# Patient Record
Sex: Female | Born: 1945 | Race: Black or African American | Hispanic: No | Marital: Married | Smoking: Never smoker
Health system: Southern US, Community
[De-identification: ages and names within clinical notes are randomized; demographics above are authoritative.]

## PROBLEM LIST (undated history)

## (undated) DIAGNOSIS — Z923 Personal history of irradiation: Secondary | ICD-10-CM

## (undated) DIAGNOSIS — F329 Major depressive disorder, single episode, unspecified: Secondary | ICD-10-CM

## (undated) DIAGNOSIS — Z8042 Family history of malignant neoplasm of prostate: Secondary | ICD-10-CM

## (undated) DIAGNOSIS — I471 Supraventricular tachycardia, unspecified: Secondary | ICD-10-CM

## (undated) DIAGNOSIS — F419 Anxiety disorder, unspecified: Secondary | ICD-10-CM

## (undated) DIAGNOSIS — Z9221 Personal history of antineoplastic chemotherapy: Secondary | ICD-10-CM

## (undated) DIAGNOSIS — Z90710 Acquired absence of both cervix and uterus: Secondary | ICD-10-CM

## (undated) DIAGNOSIS — A159 Respiratory tuberculosis unspecified: Secondary | ICD-10-CM

## (undated) DIAGNOSIS — Z800 Family history of malignant neoplasm of digestive organs: Secondary | ICD-10-CM

## (undated) DIAGNOSIS — I5020 Unspecified systolic (congestive) heart failure: Secondary | ICD-10-CM

## (undated) DIAGNOSIS — F32A Depression, unspecified: Secondary | ICD-10-CM

## (undated) DIAGNOSIS — I499 Cardiac arrhythmia, unspecified: Secondary | ICD-10-CM

## (undated) DIAGNOSIS — E785 Hyperlipidemia, unspecified: Secondary | ICD-10-CM

## (undated) DIAGNOSIS — K449 Diaphragmatic hernia without obstruction or gangrene: Secondary | ICD-10-CM

## (undated) DIAGNOSIS — C50919 Malignant neoplasm of unspecified site of unspecified female breast: Secondary | ICD-10-CM

## (undated) DIAGNOSIS — I428 Other cardiomyopathies: Secondary | ICD-10-CM

## (undated) DIAGNOSIS — K219 Gastro-esophageal reflux disease without esophagitis: Secondary | ICD-10-CM

## (undated) DIAGNOSIS — T783XXA Angioneurotic edema, initial encounter: Secondary | ICD-10-CM

## (undated) DIAGNOSIS — R928 Other abnormal and inconclusive findings on diagnostic imaging of breast: Secondary | ICD-10-CM

## (undated) DIAGNOSIS — I10 Essential (primary) hypertension: Secondary | ICD-10-CM

## (undated) DIAGNOSIS — D721 Eosinophilia: Secondary | ICD-10-CM

## (undated) HISTORY — DX: Personal history of antineoplastic chemotherapy: Z92.21

## (undated) HISTORY — DX: Personal history of irradiation: Z92.3

## (undated) HISTORY — DX: Family history of malignant neoplasm of digestive organs: Z80.0

## (undated) HISTORY — DX: Family history of malignant neoplasm of prostate: Z80.42

## (undated) HISTORY — DX: Angioneurotic edema, initial encounter: T78.3XXA

## (undated) HISTORY — DX: Depression, unspecified: F32.A

## (undated) HISTORY — DX: Gastro-esophageal reflux disease without esophagitis: K21.9

## (undated) HISTORY — DX: Other cardiomyopathies: I42.8

## (undated) HISTORY — DX: Major depressive disorder, single episode, unspecified: F32.9

## (undated) HISTORY — DX: Eosinophilia: D72.1

## (undated) HISTORY — DX: Acquired absence of both cervix and uterus: Z90.710

## (undated) HISTORY — PX: CARDIAC CATHETERIZATION: SHX172

## (undated) HISTORY — DX: Malignant neoplasm of unspecified site of unspecified female breast: C50.919

---

## 1992-09-22 HISTORY — PX: ABDOMINAL HYSTERECTOMY: SHX81

## 1997-09-22 HISTORY — PX: CHOLECYSTECTOMY: SHX55

## 2001-01-13 ENCOUNTER — Ambulatory Visit (HOSPITAL_COMMUNITY): Admission: RE | Admit: 2001-01-13 | Discharge: 2001-01-13 | Payer: Self-pay | Admitting: General Surgery

## 2002-02-25 ENCOUNTER — Encounter: Payer: Self-pay | Admitting: Neurosurgery

## 2002-02-25 ENCOUNTER — Ambulatory Visit (HOSPITAL_COMMUNITY): Admission: RE | Admit: 2002-02-25 | Discharge: 2002-02-25 | Payer: Self-pay | Admitting: Neurosurgery

## 2002-04-05 ENCOUNTER — Ambulatory Visit (HOSPITAL_COMMUNITY): Admission: RE | Admit: 2002-04-05 | Discharge: 2002-04-05 | Payer: Self-pay | Admitting: Family Medicine

## 2002-04-05 ENCOUNTER — Encounter: Payer: Self-pay | Admitting: Family Medicine

## 2002-08-25 ENCOUNTER — Encounter: Payer: Self-pay | Admitting: Neurosurgery

## 2002-08-25 ENCOUNTER — Ambulatory Visit (HOSPITAL_COMMUNITY): Admission: RE | Admit: 2002-08-25 | Discharge: 2002-08-25 | Payer: Self-pay | Admitting: Neurosurgery

## 2003-03-01 ENCOUNTER — Ambulatory Visit (HOSPITAL_COMMUNITY): Admission: RE | Admit: 2003-03-01 | Discharge: 2003-03-01 | Payer: Self-pay | Admitting: Neurosurgery

## 2003-03-01 ENCOUNTER — Encounter: Payer: Self-pay | Admitting: Neurosurgery

## 2003-05-15 ENCOUNTER — Ambulatory Visit (HOSPITAL_COMMUNITY): Admission: RE | Admit: 2003-05-15 | Discharge: 2003-05-15 | Payer: Self-pay | Admitting: Family Medicine

## 2003-05-15 ENCOUNTER — Encounter: Payer: Self-pay | Admitting: Family Medicine

## 2003-05-18 ENCOUNTER — Encounter (HOSPITAL_COMMUNITY): Admission: RE | Admit: 2003-05-18 | Discharge: 2003-06-14 | Payer: Self-pay | Admitting: Family Medicine

## 2003-06-01 ENCOUNTER — Encounter: Payer: Self-pay | Admitting: Family Medicine

## 2003-06-19 ENCOUNTER — Encounter (HOSPITAL_COMMUNITY): Admission: RE | Admit: 2003-06-19 | Discharge: 2003-07-19 | Payer: Self-pay | Admitting: Family Medicine

## 2003-07-05 ENCOUNTER — Ambulatory Visit (HOSPITAL_COMMUNITY): Admission: RE | Admit: 2003-07-05 | Discharge: 2003-07-05 | Payer: Self-pay | Admitting: Family Medicine

## 2003-07-05 ENCOUNTER — Encounter: Payer: Self-pay | Admitting: Family Medicine

## 2004-03-06 ENCOUNTER — Ambulatory Visit (HOSPITAL_COMMUNITY): Admission: RE | Admit: 2004-03-06 | Discharge: 2004-03-06 | Payer: Self-pay | Admitting: Neurosurgery

## 2004-10-04 ENCOUNTER — Ambulatory Visit: Payer: Self-pay | Admitting: Family Medicine

## 2004-10-07 ENCOUNTER — Ambulatory Visit (HOSPITAL_COMMUNITY): Admission: RE | Admit: 2004-10-07 | Discharge: 2004-10-07 | Payer: Self-pay | Admitting: Family Medicine

## 2005-04-02 ENCOUNTER — Ambulatory Visit: Payer: Self-pay | Admitting: Family Medicine

## 2005-06-04 ENCOUNTER — Ambulatory Visit: Payer: Self-pay | Admitting: Family Medicine

## 2005-10-09 ENCOUNTER — Ambulatory Visit: Payer: Self-pay | Admitting: Family Medicine

## 2006-02-05 ENCOUNTER — Ambulatory Visit: Payer: Self-pay | Admitting: Family Medicine

## 2006-03-05 ENCOUNTER — Ambulatory Visit (HOSPITAL_COMMUNITY): Admission: RE | Admit: 2006-03-05 | Discharge: 2006-03-05 | Payer: Self-pay | Admitting: Neurosurgery

## 2006-09-22 DIAGNOSIS — C50919 Malignant neoplasm of unspecified site of unspecified female breast: Secondary | ICD-10-CM

## 2006-09-22 HISTORY — PX: BREAST SURGERY: SHX581

## 2006-09-22 HISTORY — DX: Malignant neoplasm of unspecified site of unspecified female breast: C50.919

## 2006-09-22 HISTORY — PX: COLONOSCOPY: SHX174

## 2006-11-11 ENCOUNTER — Encounter: Payer: Self-pay | Admitting: Family Medicine

## 2006-11-11 ENCOUNTER — Encounter (INDEPENDENT_AMBULATORY_CARE_PROVIDER_SITE_OTHER): Payer: Self-pay | Admitting: *Deleted

## 2006-11-11 ENCOUNTER — Other Ambulatory Visit: Admission: RE | Admit: 2006-11-11 | Discharge: 2006-11-11 | Payer: Self-pay | Admitting: Family Medicine

## 2006-11-11 ENCOUNTER — Ambulatory Visit: Payer: Self-pay | Admitting: Family Medicine

## 2006-11-25 ENCOUNTER — Ambulatory Visit (HOSPITAL_COMMUNITY): Admission: RE | Admit: 2006-11-25 | Discharge: 2006-11-25 | Payer: Self-pay | Admitting: Family Medicine

## 2006-12-01 ENCOUNTER — Encounter: Payer: Self-pay | Admitting: Family Medicine

## 2006-12-01 LAB — CONVERTED CEMR LAB
BUN: 16 mg/dL (ref 6–23)
Basophils Absolute: 0 10*3/uL (ref 0.0–0.1)
Basophils Relative: 0 % (ref 0–1)
CO2: 23 meq/L (ref 19–32)
Cholesterol: 231 mg/dL — ABNORMAL HIGH (ref 0–200)
Creatinine, Ser: 0.88 mg/dL (ref 0.40–1.20)
Eosinophils Relative: 2 % (ref 0–5)
HDL: 65 mg/dL (ref >39)
LDL Cholesterol: 156 mg/dL — ABNORMAL HIGH (ref 0–99)
Monocytes Absolute: 0.6 10*3/uL (ref 0.2–0.7)
Neutro Abs: 7.2 10*3/uL (ref 1.7–7.7)
Potassium: 4.6 meq/L (ref 3.5–5.3)
RBC: 4.61 M/uL (ref 3.87–5.11)
Sodium: 142 meq/L (ref 135–145)
TSH: 1.416 microintl units/mL (ref 0.350–5.50)
Triglycerides: 51 mg/dL (ref <150)
VLDL: 10 mg/dL (ref 0–40)

## 2006-12-02 ENCOUNTER — Encounter: Payer: Self-pay | Admitting: Family Medicine

## 2006-12-02 LAB — CONVERTED CEMR LAB
ALT: 11 units/L (ref 0–35)
AST: 13 units/L (ref 0–37)
Bilirubin, Direct: 0.1 mg/dL (ref 0.0–0.3)
Total Protein: 7.4 g/dL (ref 6.0–8.3)

## 2006-12-16 ENCOUNTER — Ambulatory Visit (HOSPITAL_COMMUNITY): Admission: RE | Admit: 2006-12-16 | Discharge: 2006-12-16 | Payer: Self-pay | Admitting: Family Medicine

## 2006-12-16 ENCOUNTER — Encounter (INDEPENDENT_AMBULATORY_CARE_PROVIDER_SITE_OTHER): Payer: Self-pay | Admitting: Radiology

## 2006-12-16 HISTORY — PX: BREAST BIOPSY: SHX20

## 2006-12-29 ENCOUNTER — Ambulatory Visit (HOSPITAL_COMMUNITY): Admission: RE | Admit: 2006-12-29 | Discharge: 2006-12-29 | Payer: Self-pay | Admitting: Family Medicine

## 2007-01-01 ENCOUNTER — Encounter (INDEPENDENT_AMBULATORY_CARE_PROVIDER_SITE_OTHER): Payer: Self-pay | Admitting: Specialist

## 2007-01-01 ENCOUNTER — Ambulatory Visit (HOSPITAL_COMMUNITY): Admission: RE | Admit: 2007-01-01 | Discharge: 2007-01-01 | Payer: Self-pay | Admitting: Gastroenterology

## 2007-01-01 ENCOUNTER — Ambulatory Visit: Payer: Self-pay | Admitting: Gastroenterology

## 2007-02-02 ENCOUNTER — Encounter: Admission: RE | Admit: 2007-02-02 | Discharge: 2007-02-02 | Payer: Self-pay | Admitting: General Surgery

## 2007-02-03 ENCOUNTER — Ambulatory Visit (HOSPITAL_BASED_OUTPATIENT_CLINIC_OR_DEPARTMENT_OTHER): Admission: RE | Admit: 2007-02-03 | Discharge: 2007-02-03 | Payer: Self-pay | Admitting: General Surgery

## 2007-02-03 ENCOUNTER — Encounter: Payer: Self-pay | Admitting: General Surgery

## 2007-02-03 ENCOUNTER — Encounter (INDEPENDENT_AMBULATORY_CARE_PROVIDER_SITE_OTHER): Payer: Self-pay | Admitting: Specialist

## 2007-02-10 ENCOUNTER — Ambulatory Visit: Payer: Self-pay | Admitting: Oncology

## 2007-02-17 LAB — LACTATE DEHYDROGENASE: LDH: 133 U/L (ref 94–250)

## 2007-02-17 LAB — CANCER ANTIGEN 27.29: CA 27.29: 14 U/mL (ref 0–39)

## 2007-02-17 LAB — COMPREHENSIVE METABOLIC PANEL
Albumin: 3.9 g/dL (ref 3.5–5.2)
CO2: 23 mEq/L (ref 19–32)
Calcium: 9.3 mg/dL (ref 8.4–10.5)
Chloride: 106 mEq/L (ref 96–112)
Glucose, Bld: 94 mg/dL (ref 70–99)
Sodium: 140 mEq/L (ref 135–145)
Total Bilirubin: 0.4 mg/dL (ref 0.3–1.2)
Total Protein: 7.2 g/dL (ref 6.0–8.3)

## 2007-02-17 LAB — CBC WITH DIFFERENTIAL/PLATELET
Eosinophils Absolute: 0.2 10*3/uL (ref 0.0–0.5)
HCT: 33.5 % — ABNORMAL LOW (ref 34.8–46.6)
LYMPH%: 22.2 % (ref 14.0–48.0)
MONO#: 0.5 10*3/uL (ref 0.1–0.9)
NEUT#: 6.3 10*3/uL (ref 1.5–6.5)
Platelets: 272 10*3/uL (ref 145–400)
RBC: 4.35 10*6/uL (ref 3.70–5.32)
WBC: 9 10*3/uL (ref 3.9–10.0)

## 2007-02-21 HISTORY — PX: BREAST LUMPECTOMY: SHX2

## 2007-03-04 ENCOUNTER — Ambulatory Visit: Payer: Self-pay

## 2007-03-04 ENCOUNTER — Ambulatory Visit (HOSPITAL_COMMUNITY): Admission: RE | Admit: 2007-03-04 | Discharge: 2007-03-04 | Payer: Self-pay | Admitting: Oncology

## 2007-03-04 ENCOUNTER — Encounter: Payer: Self-pay | Admitting: Oncology

## 2007-03-08 ENCOUNTER — Encounter (INDEPENDENT_AMBULATORY_CARE_PROVIDER_SITE_OTHER): Payer: Self-pay | Admitting: General Surgery

## 2007-03-08 ENCOUNTER — Ambulatory Visit (HOSPITAL_BASED_OUTPATIENT_CLINIC_OR_DEPARTMENT_OTHER): Admission: RE | Admit: 2007-03-08 | Discharge: 2007-03-09 | Payer: Self-pay | Admitting: General Surgery

## 2007-03-11 LAB — CBC & DIFF AND RETIC
Basophils Absolute: 0 10*3/uL (ref 0.0–0.1)
EOS%: 2.1 % (ref 0.0–7.0)
Eosinophils Absolute: 0.3 10*3/uL (ref 0.0–0.5)
HCT: 35.2 % (ref 34.8–46.6)
HGB: 11.6 g/dL (ref 11.6–15.9)
IRF: 0.29 (ref 0.130–0.330)
MCH: 25.8 pg — ABNORMAL LOW (ref 26.0–34.0)
NEUT#: 8.9 10*3/uL — ABNORMAL HIGH (ref 1.5–6.5)
NEUT%: 65.6 % (ref 39.6–76.8)
RDW: 15.6 % — ABNORMAL HIGH (ref 11.3–14.5)
RETIC #: 41.9 10*3/uL (ref 19.7–115.1)
lymph#: 3.3 10*3/uL (ref 0.9–3.3)

## 2007-03-11 LAB — CHCC SMEAR

## 2007-03-11 LAB — MORPHOLOGY

## 2007-03-12 LAB — HEMOGLOBINOPATHY EVALUATION
Hemoglobin Other: 0 % (ref 0.0–0.0)
Hgb A2 Quant: 2.2 % (ref 2.2–3.2)

## 2007-03-12 LAB — FERRITIN: Ferritin: 37 ng/mL (ref 10–291)

## 2007-03-12 LAB — IRON AND TIBC
Iron: 63 ug/dL (ref 42–145)
TIBC: 296 ug/dL (ref 250–470)
UIBC: 233 ug/dL

## 2007-03-22 ENCOUNTER — Ambulatory Visit (HOSPITAL_COMMUNITY): Admission: RE | Admit: 2007-03-22 | Discharge: 2007-03-22 | Payer: Self-pay | Admitting: Oncology

## 2007-04-01 ENCOUNTER — Ambulatory Visit: Payer: Self-pay | Admitting: Oncology

## 2007-04-15 LAB — CBC WITH DIFFERENTIAL/PLATELET
BASO%: 0.2 % (ref 0.0–2.0)
Basophils Absolute: 0 10*3/uL (ref 0.0–0.1)
Eosinophils Absolute: 0.1 10*3/uL (ref 0.0–0.5)
HCT: 29.9 % — ABNORMAL LOW (ref 34.8–46.6)
HGB: 10.1 g/dL — ABNORMAL LOW (ref 11.6–15.9)
LYMPH%: 60.3 % — ABNORMAL HIGH (ref 14.0–48.0)
MCHC: 33.8 g/dL (ref 32.0–36.0)
MONO#: 0 10*3/uL — ABNORMAL LOW (ref 0.1–0.9)
NEUT#: 0.7 10*3/uL — ABNORMAL LOW (ref 1.5–6.5)
NEUT%: 33.9 % — ABNORMAL LOW (ref 39.6–76.8)
Platelets: 145 10*3/uL (ref 145–400)
WBC: 2 10*3/uL — ABNORMAL LOW (ref 3.9–10.0)
lymph#: 1.2 10*3/uL (ref 0.9–3.3)

## 2007-04-29 LAB — CBC WITH DIFFERENTIAL/PLATELET
BASO%: 0.7 % (ref 0.0–2.0)
EOS%: 0.1 % (ref 0.0–7.0)
HGB: 10.5 g/dL — ABNORMAL LOW (ref 11.6–15.9)
MCH: 25.5 pg — ABNORMAL LOW (ref 26.0–34.0)
MCHC: 32.9 g/dL (ref 32.0–36.0)
RBC: 4.11 10*6/uL (ref 3.70–5.32)
RDW: 13.1 % (ref 11.3–14.5)
lymph#: 3.1 10*3/uL (ref 0.9–3.3)

## 2007-04-29 LAB — COMPREHENSIVE METABOLIC PANEL
ALT: 11 U/L (ref 0–35)
AST: 13 U/L (ref 0–37)
Albumin: 3.8 g/dL (ref 3.5–5.2)
Alkaline Phosphatase: 90 U/L (ref 39–117)
Calcium: 8.9 mg/dL (ref 8.4–10.5)
Chloride: 104 mEq/L (ref 96–112)
Potassium: 3.6 mEq/L (ref 3.5–5.3)
Sodium: 139 mEq/L (ref 135–145)

## 2007-05-06 LAB — CBC WITH DIFFERENTIAL/PLATELET
BASO%: 3.5 % — ABNORMAL HIGH (ref 0.0–2.0)
EOS%: 0.9 % (ref 0.0–7.0)
MCH: 25.4 pg — ABNORMAL LOW (ref 26.0–34.0)
MCHC: 32.6 g/dL (ref 32.0–36.0)
MCV: 77.9 fL — ABNORMAL LOW (ref 81.0–101.0)
MONO%: 1 % (ref 0.0–13.0)
NEUT%: 37.3 % — ABNORMAL LOW (ref 39.6–76.8)
RDW: 13.4 % (ref 11.3–14.5)
lymph#: 0.8 10*3/uL — ABNORMAL LOW (ref 0.9–3.3)

## 2007-05-18 ENCOUNTER — Ambulatory Visit: Payer: Self-pay | Admitting: Oncology

## 2007-05-20 LAB — COMPREHENSIVE METABOLIC PANEL
ALT: 13 U/L (ref 0–35)
AST: 15 U/L (ref 0–37)
Albumin: 3.8 g/dL (ref 3.5–5.2)
Alkaline Phosphatase: 84 U/L (ref 39–117)
BUN: 5 mg/dL — ABNORMAL LOW (ref 6–23)
Calcium: 8.9 mg/dL (ref 8.4–10.5)
Chloride: 100 mEq/L (ref 96–112)
Potassium: 3.7 mEq/L (ref 3.5–5.3)
Sodium: 136 mEq/L (ref 135–145)
Total Protein: 6.4 g/dL (ref 6.0–8.3)

## 2007-05-20 LAB — CBC WITH DIFFERENTIAL/PLATELET
BASO%: 0.5 % (ref 0.0–2.0)
Basophils Absolute: 0 10*3/uL (ref 0.0–0.1)
EOS%: 0 % (ref 0.0–7.0)
HGB: 9.1 g/dL — ABNORMAL LOW (ref 11.6–15.9)
MCH: 26.2 pg (ref 26.0–34.0)
MCV: 77.4 fL — ABNORMAL LOW (ref 81.0–101.0)
MONO%: 9.2 % (ref 0.0–13.0)
RBC: 3.47 10*6/uL — ABNORMAL LOW (ref 3.70–5.32)
RDW: 15.6 % — ABNORMAL HIGH (ref 11.3–14.5)
lymph#: 1.2 10*3/uL (ref 0.9–3.3)

## 2007-05-27 LAB — CBC WITH DIFFERENTIAL/PLATELET
Basophils Absolute: 0 10*3/uL (ref 0.0–0.1)
EOS%: 0.6 % (ref 0.0–7.0)
Eosinophils Absolute: 0 10*3/uL (ref 0.0–0.5)
HCT: 22.4 % — ABNORMAL LOW (ref 34.8–46.6)
HGB: 7.7 g/dL — ABNORMAL LOW (ref 11.6–15.9)
MCH: 26.6 pg (ref 26.0–34.0)
MCV: 77.4 fL — ABNORMAL LOW (ref 81.0–101.0)
MONO%: 1.9 % (ref 0.0–13.0)
NEUT#: 0.3 10*3/uL — CL (ref 1.5–6.5)
NEUT%: 38.9 % — ABNORMAL LOW (ref 39.6–76.8)
Platelets: 99 10*3/uL — ABNORMAL LOW (ref 145–400)

## 2007-05-31 ENCOUNTER — Encounter (HOSPITAL_COMMUNITY): Admission: RE | Admit: 2007-05-31 | Discharge: 2007-06-21 | Payer: Self-pay | Admitting: Oncology

## 2007-05-31 LAB — CBC WITH DIFFERENTIAL/PLATELET
Basophils Absolute: 0 10*3/uL (ref 0.0–0.1)
EOS%: 0.2 % (ref 0.0–7.0)
LYMPH%: 24.4 % (ref 14.0–48.0)
MCH: 26.8 pg (ref 26.0–34.0)
MCV: 77 fL — ABNORMAL LOW (ref 81.0–101.0)
MONO%: 8.4 % (ref 0.0–13.0)
Platelets: 51 10*3/uL — ABNORMAL LOW (ref 145–400)
RBC: 2.91 10*6/uL — ABNORMAL LOW (ref 3.70–5.32)
RDW: 17.7 % — ABNORMAL HIGH (ref 11.3–14.5)

## 2007-05-31 LAB — HOLD TUBE, BLOOD BANK

## 2007-06-02 LAB — CBC WITH DIFFERENTIAL/PLATELET
BASO%: 0.2 % (ref 0.0–2.0)
EOS%: 0.2 % (ref 0.0–7.0)
HCT: 20.6 % — ABNORMAL LOW (ref 34.8–46.6)
LYMPH%: 13 % — ABNORMAL LOW (ref 14.0–48.0)
MCH: 27.1 pg (ref 26.0–34.0)
MCHC: 34.9 g/dL (ref 32.0–36.0)
MONO#: 0.3 10*3/uL (ref 0.1–0.9)
NEUT%: 80.8 % — ABNORMAL HIGH (ref 39.6–76.8)
Platelets: 63 10*3/uL — ABNORMAL LOW (ref 145–400)
RBC: 2.66 10*6/uL — ABNORMAL LOW (ref 3.70–5.32)
WBC: 5.7 10*3/uL (ref 3.9–10.0)

## 2007-06-10 LAB — COMPREHENSIVE METABOLIC PANEL
ALT: 8 U/L (ref 0–35)
AST: 14 U/L (ref 0–37)
CO2: 22 mEq/L (ref 19–32)
Calcium: 8.8 mg/dL (ref 8.4–10.5)
Chloride: 106 mEq/L (ref 96–112)
Creatinine, Ser: 0.66 mg/dL (ref 0.40–1.20)
Potassium: 3.8 mEq/L (ref 3.5–5.3)
Sodium: 142 mEq/L (ref 135–145)
Total Protein: 6.3 g/dL (ref 6.0–8.3)

## 2007-06-10 LAB — CBC WITH DIFFERENTIAL/PLATELET
BASO%: 0.9 % (ref 0.0–2.0)
EOS%: 0.1 % (ref 0.0–7.0)
HCT: 32.4 % — ABNORMAL LOW (ref 34.8–46.6)
MCH: 27.4 pg (ref 26.0–34.0)
MCHC: 33.6 g/dL (ref 32.0–36.0)
MONO#: 0.9 10*3/uL (ref 0.1–0.9)
NEUT%: 76.7 % (ref 39.6–76.8)
RBC: 3.97 10*6/uL (ref 3.70–5.32)
RDW: 16.9 % — ABNORMAL HIGH (ref 11.3–14.5)
WBC: 8 10*3/uL (ref 3.9–10.0)
lymph#: 0.8 10*3/uL — ABNORMAL LOW (ref 0.9–3.3)

## 2007-06-17 LAB — CBC WITH DIFFERENTIAL/PLATELET
Basophils Absolute: 0 10*3/uL (ref 0.0–0.1)
Eosinophils Absolute: 0 10*3/uL (ref 0.0–0.5)
HCT: 29.3 % — ABNORMAL LOW (ref 34.8–46.6)
HGB: 10 g/dL — ABNORMAL LOW (ref 11.6–15.9)
LYMPH%: 38.6 % (ref 14.0–48.0)
MCV: 81.4 fL (ref 81.0–101.0)
MONO%: 0.5 % (ref 0.0–13.0)
NEUT#: 0.6 10*3/uL — ABNORMAL LOW (ref 1.5–6.5)
NEUT%: 59.7 % (ref 39.6–76.8)
Platelets: 83 10*3/uL — ABNORMAL LOW (ref 145–400)

## 2007-06-28 ENCOUNTER — Ambulatory Visit: Payer: Self-pay | Admitting: Oncology

## 2007-07-01 LAB — CBC WITH DIFFERENTIAL/PLATELET
BASO%: 0.4 % (ref 0.0–2.0)
EOS%: 0 % (ref 0.0–7.0)
LYMPH%: 4.2 % — ABNORMAL LOW (ref 14.0–48.0)
MCH: 27.6 pg (ref 26.0–34.0)
MCHC: 33.1 g/dL (ref 32.0–36.0)
MONO#: 2.1 10*3/uL — ABNORMAL HIGH (ref 0.1–0.9)
Platelets: 172 10*3/uL (ref 145–400)
RBC: 3.3 10*6/uL — ABNORMAL LOW (ref 3.70–5.32)
WBC: 16.4 10*3/uL — ABNORMAL HIGH (ref 3.9–10.0)
lymph#: 0.7 10*3/uL — ABNORMAL LOW (ref 0.9–3.3)

## 2007-07-01 LAB — COMPREHENSIVE METABOLIC PANEL
ALT: 12 U/L (ref 0–35)
AST: 14 U/L (ref 0–37)
CO2: 26 mEq/L (ref 19–32)
Creatinine, Ser: 0.65 mg/dL (ref 0.40–1.20)
Sodium: 137 mEq/L (ref 135–145)
Total Bilirubin: 0.4 mg/dL (ref 0.3–1.2)
Total Protein: 6.7 g/dL (ref 6.0–8.3)

## 2007-07-08 LAB — CBC WITH DIFFERENTIAL/PLATELET
BASO%: 1.8 % (ref 0.0–2.0)
LYMPH%: 8.7 % — ABNORMAL LOW (ref 14.0–48.0)
MCHC: 33 g/dL (ref 32.0–36.0)
MCV: 84.3 fL (ref 81.0–101.0)
MONO%: 10.2 % (ref 0.0–13.0)
NEUT%: 79.3 % — ABNORMAL HIGH (ref 39.6–76.8)
Platelets: 152 10*3/uL (ref 145–400)
RBC: 3 10*6/uL — ABNORMAL LOW (ref 3.70–5.32)

## 2007-07-22 LAB — COMPREHENSIVE METABOLIC PANEL
ALT: 13 U/L (ref 0–35)
AST: 14 U/L (ref 0–37)
Albumin: 3.6 g/dL (ref 3.5–5.2)
CO2: 24 mEq/L (ref 19–32)
Calcium: 8.6 mg/dL (ref 8.4–10.5)
Chloride: 103 mEq/L (ref 96–112)
Creatinine, Ser: 0.57 mg/dL (ref 0.40–1.20)
Potassium: 3.4 mEq/L — ABNORMAL LOW (ref 3.5–5.3)
Sodium: 137 mEq/L (ref 135–145)
Total Protein: 6.3 g/dL (ref 6.0–8.3)

## 2007-07-22 LAB — CBC WITH DIFFERENTIAL/PLATELET
BASO%: 0.2 % (ref 0.0–2.0)
EOS%: 0 % (ref 0.0–7.0)
HCT: 27.3 % — ABNORMAL LOW (ref 34.8–46.6)
LYMPH%: 9 % — ABNORMAL LOW (ref 14.0–48.0)
MCH: 29 pg (ref 26.0–34.0)
MCHC: 32.4 g/dL (ref 32.0–36.0)
MCV: 89.5 fL (ref 81.0–101.0)
MONO%: 17.5 % — ABNORMAL HIGH (ref 0.0–13.0)
NEUT%: 73.2 % (ref 39.6–76.8)
Platelets: 233 10*3/uL (ref 145–400)
RBC: 3.05 10*6/uL — ABNORMAL LOW (ref 3.70–5.32)
WBC: 6.9 10*3/uL (ref 3.9–10.0)

## 2007-07-22 LAB — LACTATE DEHYDROGENASE: LDH: 175 U/L (ref 94–250)

## 2007-07-29 LAB — CBC WITH DIFFERENTIAL/PLATELET
BASO%: 0.2 % (ref 0.0–2.0)
Basophils Absolute: 0 10*3/uL (ref 0.0–0.1)
EOS%: 0.3 % (ref 0.0–7.0)
MCH: 30.2 pg (ref 26.0–34.0)
MCHC: 33.5 g/dL (ref 32.0–36.0)
MCV: 89.9 fL (ref 81.0–101.0)
MONO%: 6.1 % (ref 0.0–13.0)
RBC: 2.83 10*6/uL — ABNORMAL LOW (ref 3.70–5.32)
RDW: 20.4 % — ABNORMAL HIGH (ref 11.3–14.5)
lymph#: 1 10*3/uL (ref 0.9–3.3)

## 2007-08-11 ENCOUNTER — Ambulatory Visit: Payer: Self-pay | Admitting: Oncology

## 2007-08-12 LAB — COMPREHENSIVE METABOLIC PANEL
AST: 14 U/L (ref 0–37)
Albumin: 3.9 g/dL (ref 3.5–5.2)
Alkaline Phosphatase: 73 U/L (ref 39–117)
Calcium: 8.7 mg/dL (ref 8.4–10.5)
Chloride: 101 mEq/L (ref 96–112)
Potassium: 3.9 mEq/L (ref 3.5–5.3)
Sodium: 138 mEq/L (ref 135–145)
Total Protein: 6.4 g/dL (ref 6.0–8.3)

## 2007-08-12 LAB — CBC WITH DIFFERENTIAL/PLATELET
Basophils Absolute: 0 10*3/uL (ref 0.0–0.1)
EOS%: 0.2 % (ref 0.0–7.0)
Eosinophils Absolute: 0 10*3/uL (ref 0.0–0.5)
HGB: 9.7 g/dL — ABNORMAL LOW (ref 11.6–15.9)
MCV: 91.9 fL (ref 81.0–101.0)
MONO%: 14.4 % — ABNORMAL HIGH (ref 0.0–13.0)
NEUT#: 5.8 10*3/uL (ref 1.5–6.5)
RBC: 3.2 10*6/uL — ABNORMAL LOW (ref 3.70–5.32)
RDW: 14 % (ref 11.3–14.5)
lymph#: 0.5 10*3/uL — ABNORMAL LOW (ref 0.9–3.3)

## 2007-08-20 LAB — CBC WITH DIFFERENTIAL/PLATELET
BASO%: 0.4 % (ref 0.0–2.0)
EOS%: 0.8 % (ref 0.0–7.0)
HCT: 29.6 % — ABNORMAL LOW (ref 34.8–46.6)
MCH: 30.2 pg (ref 26.0–34.0)
MCHC: 33.2 g/dL (ref 32.0–36.0)
NEUT%: 81.7 % — ABNORMAL HIGH (ref 39.6–76.8)
RBC: 3.26 10*6/uL — ABNORMAL LOW (ref 3.70–5.32)
lymph#: 1 10*3/uL (ref 0.9–3.3)

## 2007-08-24 ENCOUNTER — Ambulatory Visit: Admission: RE | Admit: 2007-08-24 | Discharge: 2007-09-22 | Payer: Self-pay | Admitting: Radiation Oncology

## 2007-08-31 ENCOUNTER — Encounter: Admission: RE | Admit: 2007-08-31 | Discharge: 2007-08-31 | Payer: Self-pay | Admitting: Radiation Oncology

## 2007-09-02 LAB — COMPREHENSIVE METABOLIC PANEL
ALT: 9 U/L (ref 0–35)
AST: 15 U/L (ref 0–37)
Albumin: 3.4 g/dL — ABNORMAL LOW (ref 3.5–5.2)
Calcium: 8.6 mg/dL (ref 8.4–10.5)
Chloride: 103 mEq/L (ref 96–112)
Potassium: 3.6 mEq/L (ref 3.5–5.3)

## 2007-09-02 LAB — CBC WITH DIFFERENTIAL/PLATELET
BASO%: 0.1 % (ref 0.0–2.0)
EOS%: 11.4 % — ABNORMAL HIGH (ref 0.0–7.0)
HGB: 9.4 g/dL — ABNORMAL LOW (ref 11.6–15.9)
MCH: 29.7 pg (ref 26.0–34.0)
MCHC: 33.5 g/dL (ref 32.0–36.0)
RDW: 15.5 % — ABNORMAL HIGH (ref 11.3–14.5)
lymph#: 0.5 10*3/uL — ABNORMAL LOW (ref 0.9–3.3)

## 2007-09-08 ENCOUNTER — Encounter (INDEPENDENT_AMBULATORY_CARE_PROVIDER_SITE_OTHER): Payer: Self-pay | Admitting: *Deleted

## 2007-09-09 LAB — CBC WITH DIFFERENTIAL/PLATELET
BASO%: 0.3 % (ref 0.0–2.0)
EOS%: 42 % — ABNORMAL HIGH (ref 0.0–7.0)
MCH: 29.3 pg (ref 26.0–34.0)
MCHC: 33.4 g/dL (ref 32.0–36.0)
NEUT%: 46.5 % (ref 39.6–76.8)
RDW: 15.4 % — ABNORMAL HIGH (ref 11.3–14.5)
lymph#: 1 10*3/uL (ref 0.9–3.3)

## 2007-09-22 ENCOUNTER — Ambulatory Visit (HOSPITAL_COMMUNITY): Admission: RE | Admit: 2007-09-22 | Discharge: 2007-09-22 | Payer: Self-pay | Admitting: Oncology

## 2007-09-22 LAB — CBC WITH DIFFERENTIAL/PLATELET
Basophils Absolute: 0 10*3/uL (ref 0.0–0.1)
EOS%: 27.5 % — ABNORMAL HIGH (ref 0.0–7.0)
HGB: 9.7 g/dL — ABNORMAL LOW (ref 11.6–15.9)
MCH: 29 pg (ref 26.0–34.0)
NEUT#: 8.9 10*3/uL — ABNORMAL HIGH (ref 1.5–6.5)
RBC: 3.35 10*6/uL — ABNORMAL LOW (ref 3.70–5.32)
RDW: 15.1 % — ABNORMAL HIGH (ref 11.3–14.5)
lymph#: 0.7 10*3/uL — ABNORMAL LOW (ref 0.9–3.3)

## 2007-09-22 LAB — COMPREHENSIVE METABOLIC PANEL
ALT: 31 U/L (ref 0–35)
Albumin: 2.5 g/dL — ABNORMAL LOW (ref 3.5–5.2)
CO2: 30 mEq/L (ref 19–32)
Calcium: 8.8 mg/dL (ref 8.4–10.5)
Chloride: 97 mEq/L (ref 96–112)
Potassium: 3.8 mEq/L (ref 3.5–5.3)
Sodium: 137 mEq/L (ref 135–145)
Total Protein: 7 g/dL (ref 6.0–8.3)

## 2007-09-23 ENCOUNTER — Ambulatory Visit: Admission: RE | Admit: 2007-09-23 | Discharge: 2007-11-26 | Payer: Self-pay | Admitting: Radiation Oncology

## 2007-09-27 ENCOUNTER — Ambulatory Visit: Payer: Self-pay | Admitting: Oncology

## 2007-09-27 LAB — CBC WITH DIFFERENTIAL/PLATELET
BASO%: 0.1 % (ref 0.0–2.0)
LYMPH%: 9.6 % — ABNORMAL LOW (ref 14.0–48.0)
MCHC: 33.1 g/dL (ref 32.0–36.0)
MCV: 85.2 fL (ref 81.0–101.0)
MONO%: 7.5 % (ref 0.0–13.0)
NEUT%: 77.1 % — ABNORMAL HIGH (ref 39.6–76.8)
Platelets: 314 10*3/uL (ref 145–400)
RBC: 3.17 10*6/uL — ABNORMAL LOW (ref 3.70–5.32)

## 2007-09-28 LAB — CULTURE, BLOOD (SINGLE)

## 2007-09-29 LAB — CBC WITH DIFFERENTIAL/PLATELET
BASO%: 0.4 % (ref 0.0–2.0)
EOS%: 5.9 % (ref 0.0–7.0)
LYMPH%: 7.7 % — ABNORMAL LOW (ref 14.0–48.0)
MCH: 28 pg (ref 26.0–34.0)
MCHC: 31.7 g/dL — ABNORMAL LOW (ref 32.0–36.0)
MONO#: 1.4 10*3/uL — ABNORMAL HIGH (ref 0.1–0.9)
Platelets: 250 10*3/uL (ref 145–400)
RBC: 3.56 10*6/uL — ABNORMAL LOW (ref 3.70–5.32)
WBC: 13.8 10*3/uL — ABNORMAL HIGH (ref 3.9–10.0)
lymph#: 1.1 10*3/uL (ref 0.9–3.3)

## 2007-10-06 LAB — CBC WITH DIFFERENTIAL/PLATELET
BASO%: 0 % (ref 0.0–2.0)
EOS%: 17.7 % — ABNORMAL HIGH (ref 0.0–7.0)
HCT: 30 % — ABNORMAL LOW (ref 34.8–46.6)
LYMPH%: 5.1 % — ABNORMAL LOW (ref 14.0–48.0)
MCH: 28.8 pg (ref 26.0–34.0)
MCHC: 33.6 g/dL (ref 32.0–36.0)
MONO#: 0.7 10*3/uL (ref 0.1–0.9)
NEUT%: 70.3 % (ref 39.6–76.8)
RBC: 3.5 10*6/uL — ABNORMAL LOW (ref 3.70–5.32)
WBC: 9.6 10*3/uL (ref 3.9–10.0)
lymph#: 0.5 10*3/uL — ABNORMAL LOW (ref 0.9–3.3)

## 2007-10-13 LAB — CBC WITH DIFFERENTIAL/PLATELET
BASO%: 0 % (ref 0.0–2.0)
EOS%: 23.9 % — ABNORMAL HIGH (ref 0.0–7.0)
HCT: 35.2 % (ref 34.8–46.6)
HGB: 11.5 g/dL — ABNORMAL LOW (ref 11.6–15.9)
MCH: 28.2 pg (ref 26.0–34.0)
MCHC: 32.8 g/dL (ref 32.0–36.0)
MONO#: 0.6 10*3/uL (ref 0.1–0.9)
RDW: 18.3 % — ABNORMAL HIGH (ref 11.3–14.5)
WBC: 7.8 10*3/uL (ref 3.9–10.0)
lymph#: 0.3 10*3/uL — ABNORMAL LOW (ref 0.9–3.3)

## 2007-10-13 LAB — COMPREHENSIVE METABOLIC PANEL
ALT: 8 U/L (ref 0–35)
AST: 11 U/L (ref 0–37)
Albumin: 3.6 g/dL (ref 3.5–5.2)
CO2: 24 mEq/L (ref 19–32)
Calcium: 8.5 mg/dL (ref 8.4–10.5)
Chloride: 99 mEq/L (ref 96–112)
Potassium: 4.3 mEq/L (ref 3.5–5.3)
Total Protein: 6.9 g/dL (ref 6.0–8.3)

## 2007-10-20 LAB — CBC WITH DIFFERENTIAL/PLATELET
BASO%: 0 % (ref 0.0–2.0)
EOS%: 33 % — ABNORMAL HIGH (ref 0.0–7.0)
MCH: 27.4 pg (ref 26.0–34.0)
MCHC: 32.1 g/dL (ref 32.0–36.0)
NEUT%: 55.5 % (ref 39.6–76.8)
RBC: 4.32 10*6/uL (ref 3.70–5.32)
RDW: 19.6 % — ABNORMAL HIGH (ref 11.3–14.5)
WBC: 9.4 10*3/uL (ref 3.9–10.0)
lymph#: 0.7 10*3/uL — ABNORMAL LOW (ref 0.9–3.3)

## 2007-10-20 LAB — TECHNOLOGIST REVIEW

## 2007-11-11 LAB — CBC WITH DIFFERENTIAL/PLATELET
Eosinophils Absolute: 0.9 10*3/uL — ABNORMAL HIGH (ref 0.0–0.5)
HCT: 32.3 % — ABNORMAL LOW (ref 34.8–46.6)
LYMPH%: 5.9 % — ABNORMAL LOW (ref 14.0–48.0)
MCHC: 33.3 g/dL (ref 32.0–36.0)
MCV: 82.2 fL (ref 81.0–101.0)
MONO#: 0.5 10*3/uL (ref 0.1–0.9)
MONO%: 8.1 % (ref 0.0–13.0)
NEUT%: 71.2 % (ref 39.6–76.8)
Platelets: 157 10*3/uL (ref 145–400)
RBC: 3.93 10*6/uL (ref 3.70–5.32)
WBC: 5.9 10*3/uL (ref 3.9–10.0)

## 2007-11-11 LAB — COMPREHENSIVE METABOLIC PANEL
Alkaline Phosphatase: 72 U/L (ref 39–117)
CO2: 25 mEq/L (ref 19–32)
Creatinine, Ser: 0.6 mg/dL (ref 0.40–1.20)
Glucose, Bld: 84 mg/dL (ref 70–99)
Sodium: 132 mEq/L — ABNORMAL LOW (ref 135–145)
Total Bilirubin: 0.3 mg/dL (ref 0.3–1.2)

## 2007-11-23 ENCOUNTER — Ambulatory Visit: Payer: Self-pay | Admitting: Family Medicine

## 2007-12-07 ENCOUNTER — Ambulatory Visit: Payer: Self-pay | Admitting: Oncology

## 2007-12-09 ENCOUNTER — Encounter: Payer: Self-pay | Admitting: Family Medicine

## 2007-12-09 LAB — CBC WITH DIFFERENTIAL/PLATELET
EOS%: 3.4 % (ref 0.0–7.0)
LYMPH%: 9.6 % — ABNORMAL LOW (ref 14.0–48.0)
MCH: 27.2 pg (ref 26.0–34.0)
MCHC: 33.2 g/dL (ref 32.0–36.0)
MCV: 81.8 fL (ref 81.0–101.0)
MONO%: 8.1 % (ref 0.0–13.0)
Platelets: 150 10*3/uL (ref 145–400)
RBC: 3.75 10*6/uL (ref 3.70–5.32)
RDW: 16.9 % — ABNORMAL HIGH (ref 11.3–14.5)

## 2007-12-09 LAB — CONVERTED CEMR LAB
ALT: 12 units/L (ref 0–35)
Albumin: 3.8 g/dL (ref 3.5–5.2)
Alkaline Phosphatase: 66 units/L (ref 39–117)
Bilirubin, Direct: 0.1 mg/dL (ref 0.0–0.3)
Cholesterol: 247 mg/dL — ABNORMAL HIGH (ref 0–200)
Indirect Bilirubin: 0.3 mg/dL (ref 0.0–0.9)
LDL Cholesterol: 175 mg/dL — ABNORMAL HIGH (ref 0–99)
Potassium: 4.4 meq/L (ref 3.5–5.3)
Triglycerides: 53 mg/dL (ref <150)

## 2007-12-09 LAB — MORPHOLOGY

## 2007-12-13 DIAGNOSIS — K219 Gastro-esophageal reflux disease without esophagitis: Secondary | ICD-10-CM | POA: Insufficient documentation

## 2007-12-13 DIAGNOSIS — F321 Major depressive disorder, single episode, moderate: Secondary | ICD-10-CM

## 2007-12-13 DIAGNOSIS — F324 Major depressive disorder, single episode, in partial remission: Secondary | ICD-10-CM | POA: Insufficient documentation

## 2007-12-13 DIAGNOSIS — F411 Generalized anxiety disorder: Secondary | ICD-10-CM | POA: Insufficient documentation

## 2007-12-13 DIAGNOSIS — C50911 Malignant neoplasm of unspecified site of right female breast: Secondary | ICD-10-CM | POA: Insufficient documentation

## 2007-12-13 DIAGNOSIS — E785 Hyperlipidemia, unspecified: Secondary | ICD-10-CM | POA: Insufficient documentation

## 2008-01-06 LAB — MORPHOLOGY

## 2008-01-06 LAB — CBC WITH DIFFERENTIAL/PLATELET
BASO%: 0 % (ref 0.0–2.0)
Eosinophils Absolute: 0 10*3/uL (ref 0.0–0.5)
LYMPH%: 7.4 % — ABNORMAL LOW (ref 14.0–48.0)
MONO#: 0.5 10*3/uL (ref 0.1–0.9)
NEUT#: 5.3 10*3/uL (ref 1.5–6.5)
Platelets: 176 10*3/uL (ref 145–400)
RBC: 3.42 10*6/uL — ABNORMAL LOW (ref 3.70–5.32)
RDW: 19 % — ABNORMAL HIGH (ref 11.3–14.5)
WBC: 6.3 10*3/uL (ref 3.9–10.0)
lymph#: 0.5 10*3/uL — ABNORMAL LOW (ref 0.9–3.3)

## 2008-01-21 ENCOUNTER — Encounter: Admission: RE | Admit: 2008-01-21 | Discharge: 2008-01-21 | Payer: Self-pay | Admitting: General Surgery

## 2008-02-15 ENCOUNTER — Ambulatory Visit: Payer: Self-pay | Admitting: Oncology

## 2008-02-17 LAB — MORPHOLOGY

## 2008-02-17 LAB — CBC & DIFF AND RETIC
BASO%: 0.1 % (ref 0.0–2.0)
HCT: 29.3 % — ABNORMAL LOW (ref 34.8–46.6)
IRF: 0.22 (ref 0.130–0.330)
MCHC: 33.3 g/dL (ref 32.0–36.0)
MONO#: 0.4 10*3/uL (ref 0.1–0.9)
RBC: 3.48 10*6/uL — ABNORMAL LOW (ref 3.70–5.32)
Retic %: 1.2 % (ref 0.4–2.3)
WBC: 5.4 10*3/uL (ref 3.9–10.0)
lymph#: 0.5 10*3/uL — ABNORMAL LOW (ref 0.9–3.3)

## 2008-02-18 LAB — LACTATE DEHYDROGENASE: LDH: 131 U/L (ref 94–250)

## 2008-02-18 LAB — COMPREHENSIVE METABOLIC PANEL
ALT: 13 U/L (ref 0–35)
AST: 16 U/L (ref 0–37)
Creatinine, Ser: 0.68 mg/dL (ref 0.40–1.20)
Total Bilirubin: 0.3 mg/dL (ref 0.3–1.2)

## 2008-03-20 LAB — CBC WITH DIFFERENTIAL/PLATELET
Basophils Absolute: 0 10*3/uL (ref 0.0–0.1)
Eosinophils Absolute: 0.1 10*3/uL (ref 0.0–0.5)
HGB: 10.9 g/dL — ABNORMAL LOW (ref 11.6–15.9)
MONO#: 0.4 10*3/uL (ref 0.1–0.9)
NEUT#: 3.6 10*3/uL (ref 1.5–6.5)
RBC: 3.81 10*6/uL (ref 3.70–5.32)
RDW: 14.1 % (ref 11.3–14.5)
WBC: 4.7 10*3/uL (ref 3.9–10.0)
lymph#: 0.6 10*3/uL — ABNORMAL LOW (ref 0.9–3.3)

## 2008-04-12 ENCOUNTER — Ambulatory Visit: Payer: Self-pay | Admitting: Oncology

## 2008-04-17 LAB — CBC WITH DIFFERENTIAL/PLATELET
BASO%: 0.2 % (ref 0.0–2.0)
Basophils Absolute: 0 10*3/uL (ref 0.0–0.1)
EOS%: 2.6 % (ref 0.0–7.0)
HGB: 10.9 g/dL — ABNORMAL LOW (ref 11.6–15.9)
MCH: 28.1 pg (ref 26.0–34.0)
MCHC: 33.6 g/dL (ref 32.0–36.0)
MCV: 83.6 fL (ref 81.0–101.0)
MONO%: 8.7 % (ref 0.0–13.0)
RBC: 3.9 10*6/uL (ref 3.70–5.32)
RDW: 13.8 % (ref 11.3–14.5)
lymph#: 0.8 10*3/uL — ABNORMAL LOW (ref 0.9–3.3)

## 2008-05-15 LAB — CBC WITH DIFFERENTIAL/PLATELET
BASO%: 0.3 % (ref 0.0–2.0)
EOS%: 3.1 % (ref 0.0–7.0)
HCT: 31.9 % — ABNORMAL LOW (ref 34.8–46.6)
MCH: 28.9 pg (ref 26.0–34.0)
MCHC: 34 g/dL (ref 32.0–36.0)
MCV: 84.8 fL (ref 81.0–101.0)
MONO%: 9.4 % (ref 0.0–13.0)
NEUT%: 74.2 % (ref 39.6–76.8)
lymph#: 0.7 10*3/uL — ABNORMAL LOW (ref 0.9–3.3)

## 2008-05-18 ENCOUNTER — Encounter: Payer: Self-pay | Admitting: Family Medicine

## 2008-06-15 ENCOUNTER — Ambulatory Visit: Payer: Self-pay | Admitting: Oncology

## 2008-06-19 ENCOUNTER — Ambulatory Visit (HOSPITAL_COMMUNITY): Admission: RE | Admit: 2008-06-19 | Discharge: 2008-06-19 | Payer: Self-pay | Admitting: Oncology

## 2008-06-19 LAB — CBC WITH DIFFERENTIAL/PLATELET
BASO%: 0.3 % (ref 0.0–2.0)
Basophils Absolute: 0 10*3/uL (ref 0.0–0.1)
Eosinophils Absolute: 0.1 10*3/uL (ref 0.0–0.5)
HCT: 30.7 % — ABNORMAL LOW (ref 34.8–46.6)
HGB: 10.3 g/dL — ABNORMAL LOW (ref 11.6–15.9)
LYMPH%: 11 % — ABNORMAL LOW (ref 14.0–48.0)
MCHC: 33.6 g/dL (ref 32.0–36.0)
MONO#: 0.3 10*3/uL (ref 0.1–0.9)
NEUT%: 79.8 % — ABNORMAL HIGH (ref 39.6–76.8)
Platelets: 158 10*3/uL (ref 145–400)
WBC: 5 10*3/uL (ref 3.9–10.0)
lymph#: 0.5 10*3/uL — ABNORMAL LOW (ref 0.9–3.3)

## 2008-06-20 LAB — COMPREHENSIVE METABOLIC PANEL
ALT: 26 U/L (ref 0–35)
BUN: 15 mg/dL (ref 6–23)
CO2: 27 mEq/L (ref 19–32)
Calcium: 9.3 mg/dL (ref 8.4–10.5)
Chloride: 98 mEq/L (ref 96–112)
Creatinine, Ser: 0.82 mg/dL (ref 0.40–1.20)
Glucose, Bld: 81 mg/dL (ref 70–99)
Total Bilirubin: 0.3 mg/dL (ref 0.3–1.2)

## 2008-06-20 LAB — CANCER ANTIGEN 27.29: CA 27.29: 14 U/mL (ref 0–39)

## 2008-06-20 LAB — LACTATE DEHYDROGENASE: LDH: 123 U/L (ref 94–250)

## 2008-06-20 LAB — VITAMIN D 25 HYDROXY (VIT D DEFICIENCY, FRACTURES): Vit D, 25-Hydroxy: 34 ng/mL (ref 30–89)

## 2008-06-28 ENCOUNTER — Encounter: Payer: Self-pay | Admitting: Family Medicine

## 2008-07-04 ENCOUNTER — Ambulatory Visit: Payer: Self-pay | Admitting: Family Medicine

## 2008-07-09 DIAGNOSIS — H6120 Impacted cerumen, unspecified ear: Secondary | ICD-10-CM | POA: Insufficient documentation

## 2008-07-17 LAB — CBC WITH DIFFERENTIAL/PLATELET
BASO%: 1 % (ref 0.0–2.0)
Eosinophils Absolute: 0.1 10*3/uL (ref 0.0–0.5)
HCT: 33.9 % — ABNORMAL LOW (ref 34.8–46.6)
LYMPH%: 12 % — ABNORMAL LOW (ref 14.0–48.0)
MCHC: 33 g/dL (ref 32.0–36.0)
MONO#: 0.5 10*3/uL (ref 0.1–0.9)
NEUT#: 4 10*3/uL (ref 1.5–6.5)
Platelets: 154 10*3/uL (ref 145–400)
RBC: 4.05 10*6/uL (ref 3.70–5.32)
WBC: 5.3 10*3/uL (ref 3.9–10.0)
lymph#: 0.6 10*3/uL — ABNORMAL LOW (ref 0.9–3.3)

## 2008-09-05 ENCOUNTER — Ambulatory Visit (HOSPITAL_BASED_OUTPATIENT_CLINIC_OR_DEPARTMENT_OTHER): Admission: RE | Admit: 2008-09-05 | Discharge: 2008-09-05 | Payer: Self-pay | Admitting: General Surgery

## 2008-10-17 ENCOUNTER — Ambulatory Visit: Payer: Self-pay | Admitting: Oncology

## 2008-10-19 ENCOUNTER — Encounter: Payer: Self-pay | Admitting: Family Medicine

## 2008-10-19 LAB — COMPREHENSIVE METABOLIC PANEL
AST: 15 U/L (ref 0–37)
BUN: 17 mg/dL (ref 6–23)
CO2: 26 mEq/L (ref 19–32)
Calcium: 9.2 mg/dL (ref 8.4–10.5)
Chloride: 103 mEq/L (ref 96–112)
Creatinine, Ser: 0.9 mg/dL (ref 0.40–1.20)

## 2008-10-19 LAB — LACTATE DEHYDROGENASE: LDH: 131 U/L (ref 94–250)

## 2008-10-19 LAB — CBC WITH DIFFERENTIAL/PLATELET
Basophils Absolute: 0 10*3/uL (ref 0.0–0.1)
EOS%: 4.2 % (ref 0.0–7.0)
HCT: 34.6 % — ABNORMAL LOW (ref 34.8–46.6)
HGB: 11.4 g/dL — ABNORMAL LOW (ref 11.6–15.9)
MCH: 28.2 pg (ref 26.0–34.0)
NEUT%: 72.3 % (ref 39.6–76.8)
Platelets: 151 10*3/uL (ref 145–400)
lymph#: 0.8 10*3/uL — ABNORMAL LOW (ref 0.9–3.3)

## 2008-10-19 LAB — VITAMIN B12: Vitamin B-12: 649 pg/mL (ref 211–911)

## 2008-10-20 ENCOUNTER — Encounter: Payer: Self-pay | Admitting: Family Medicine

## 2008-10-20 LAB — CONVERTED CEMR LAB: Cholesterol: 176 mg/dL (ref 0–200)

## 2008-10-23 LAB — CONVERTED CEMR LAB
ALT: 14 units/L (ref 0–35)
AST: 13 units/L (ref 0–37)
Alkaline Phosphatase: 109 units/L (ref 39–117)
Total Bilirubin: 0.2 mg/dL — ABNORMAL LOW (ref 0.3–1.2)

## 2008-11-06 ENCOUNTER — Ambulatory Visit: Payer: Self-pay | Admitting: Family Medicine

## 2009-01-22 ENCOUNTER — Encounter: Admission: RE | Admit: 2009-01-22 | Discharge: 2009-01-22 | Payer: Self-pay | Admitting: Oncology

## 2009-01-25 ENCOUNTER — Encounter: Payer: Self-pay | Admitting: Family Medicine

## 2009-04-09 ENCOUNTER — Ambulatory Visit: Payer: Self-pay | Admitting: Oncology

## 2009-04-11 LAB — CBC WITH DIFFERENTIAL/PLATELET
BASO%: 0.1 % (ref 0.0–2.0)
EOS%: 1.8 % (ref 0.0–7.0)
HCT: 33.6 % — ABNORMAL LOW (ref 34.8–46.6)
LYMPH%: 17.2 % (ref 14.0–49.7)
MCH: 29.1 pg (ref 25.1–34.0)
MCHC: 33.7 g/dL (ref 31.5–36.0)
MONO#: 0.4 10*3/uL (ref 0.1–0.9)
MONO%: 7.1 % (ref 0.0–14.0)
NEUT%: 73.8 % (ref 38.4–76.8)
Platelets: 146 10*3/uL (ref 145–400)
RBC: 3.89 10*6/uL (ref 3.70–5.45)
WBC: 5.9 10*3/uL (ref 3.9–10.3)

## 2009-04-11 LAB — COMPREHENSIVE METABOLIC PANEL
ALT: 11 U/L (ref 0–35)
AST: 15 U/L (ref 0–37)
Alkaline Phosphatase: 83 U/L (ref 39–117)
Creatinine, Ser: 0.92 mg/dL (ref 0.40–1.20)
Sodium: 133 mEq/L — ABNORMAL LOW (ref 135–145)
Total Bilirubin: 0.4 mg/dL (ref 0.3–1.2)
Total Protein: 6.7 g/dL (ref 6.0–8.3)

## 2009-04-11 LAB — CANCER ANTIGEN 27.29: CA 27.29: 13 U/mL (ref 0–39)

## 2009-04-11 LAB — LACTATE DEHYDROGENASE: LDH: 124 U/L (ref 94–250)

## 2009-04-18 ENCOUNTER — Encounter: Payer: Self-pay | Admitting: Family Medicine

## 2009-04-24 ENCOUNTER — Encounter: Payer: Self-pay | Admitting: Family Medicine

## 2009-06-28 ENCOUNTER — Encounter: Payer: Self-pay | Admitting: Family Medicine

## 2009-06-28 LAB — CONVERTED CEMR LAB
AST: 15 units/L (ref 0–37)
Albumin: 3.7 g/dL (ref 3.5–5.2)
BUN: 12 mg/dL (ref 6–23)
Basophils Absolute: 0 10*3/uL (ref 0.0–0.1)
CO2: 25 meq/L (ref 19–32)
Calcium: 8.7 mg/dL (ref 8.4–10.5)
Cholesterol: 172 mg/dL (ref 0–200)
Creatinine, Ser: 0.91 mg/dL (ref 0.40–1.20)
HCT: 38 % (ref 36.0–46.0)
LDL Cholesterol: 82 mg/dL (ref 0–99)
Lymphocytes Relative: 29 % (ref 12–46)
Lymphs Abs: 1.8 10*3/uL (ref 0.7–4.0)
Monocytes Relative: 9 % (ref 3–12)
Neutro Abs: 3.5 10*3/uL (ref 1.7–7.7)
Platelets: 156 10*3/uL (ref 150–400)
Potassium: 4.2 meq/L (ref 3.5–5.3)
RDW: 14.6 % (ref 11.5–15.5)
TSH: 3.902 microintl units/mL (ref 0.350–4.500)
Total Bilirubin: 0.4 mg/dL (ref 0.3–1.2)
Total Protein: 6.3 g/dL (ref 6.0–8.3)
VLDL: 11 mg/dL (ref 0–40)
WBC: 6.4 10*3/uL (ref 4.0–10.5)

## 2009-07-04 ENCOUNTER — Encounter: Payer: Self-pay | Admitting: Family Medicine

## 2009-07-04 ENCOUNTER — Ambulatory Visit: Payer: Self-pay | Admitting: Family Medicine

## 2009-07-04 ENCOUNTER — Other Ambulatory Visit: Admission: RE | Admit: 2009-07-04 | Discharge: 2009-07-04 | Payer: Self-pay | Admitting: Family Medicine

## 2009-07-24 ENCOUNTER — Telehealth: Payer: Self-pay | Admitting: Family Medicine

## 2009-10-16 ENCOUNTER — Ambulatory Visit: Payer: Self-pay | Admitting: Oncology

## 2009-10-18 ENCOUNTER — Encounter: Payer: Self-pay | Admitting: Family Medicine

## 2009-10-18 LAB — CBC WITH DIFFERENTIAL/PLATELET
Basophils Absolute: 0 10*3/uL (ref 0.0–0.1)
Eosinophils Absolute: 0.4 10*3/uL (ref 0.0–0.5)
HGB: 12.3 g/dL (ref 11.6–15.9)
MONO#: 0.4 10*3/uL (ref 0.1–0.9)
NEUT#: 4.1 10*3/uL (ref 1.5–6.5)
RDW: 14.5 % (ref 11.2–14.5)
WBC: 6 10*3/uL (ref 3.9–10.3)
lymph#: 1.1 10*3/uL (ref 0.9–3.3)

## 2009-10-18 LAB — COMPREHENSIVE METABOLIC PANEL
Albumin: 4.4 g/dL (ref 3.5–5.2)
BUN: 10 mg/dL (ref 6–23)
Calcium: 9.4 mg/dL (ref 8.4–10.5)
Chloride: 104 mEq/L (ref 96–112)
Glucose, Bld: 56 mg/dL — ABNORMAL LOW (ref 70–99)
Potassium: 4 mEq/L (ref 3.5–5.3)
Sodium: 139 mEq/L (ref 135–145)
Total Protein: 7.2 g/dL (ref 6.0–8.3)

## 2009-10-18 LAB — CANCER ANTIGEN 27.29: CA 27.29: 15 U/mL (ref 0–39)

## 2009-10-18 LAB — VITAMIN D 25 HYDROXY (VIT D DEFICIENCY, FRACTURES): Vit D, 25-Hydroxy: 20 ng/mL — ABNORMAL LOW (ref 30–89)

## 2010-01-01 ENCOUNTER — Ambulatory Visit: Payer: Self-pay | Admitting: Family Medicine

## 2010-01-01 DIAGNOSIS — R5381 Other malaise: Secondary | ICD-10-CM | POA: Insufficient documentation

## 2010-01-01 DIAGNOSIS — R5383 Other fatigue: Secondary | ICD-10-CM

## 2010-01-21 ENCOUNTER — Telehealth: Payer: Self-pay | Admitting: Family Medicine

## 2010-01-23 ENCOUNTER — Encounter: Admission: RE | Admit: 2010-01-23 | Discharge: 2010-01-23 | Payer: Self-pay | Admitting: Oncology

## 2010-03-14 ENCOUNTER — Ambulatory Visit: Payer: Self-pay | Admitting: Oncology

## 2010-03-18 ENCOUNTER — Encounter: Payer: Self-pay | Admitting: Family Medicine

## 2010-03-18 ENCOUNTER — Telehealth: Payer: Self-pay | Admitting: Family Medicine

## 2010-03-18 LAB — COMPREHENSIVE METABOLIC PANEL
ALT: <8 U/L (ref 0–35)
Albumin: 3.8 g/dL (ref 3.5–5.2)
CO2: 27 mEq/L (ref 19–32)
Calcium: 9 mg/dL (ref 8.4–10.5)
Chloride: 107 mEq/L (ref 96–112)
Glucose, Bld: 107 mg/dL — ABNORMAL HIGH (ref 70–99)
Potassium: 4.1 mEq/L (ref 3.5–5.3)
Sodium: 144 mEq/L (ref 135–145)
Total Protein: 6.6 g/dL (ref 6.0–8.3)

## 2010-03-18 LAB — CBC WITH DIFFERENTIAL/PLATELET
Eosinophils Absolute: 0.4 10*3/uL (ref 0.0–0.5)
LYMPH%: 13.9 % — ABNORMAL LOW (ref 14.0–49.7)
MONO#: 0.5 10*3/uL (ref 0.1–0.9)
NEUT#: 6 10*3/uL (ref 1.5–6.5)
Platelets: 167 10*3/uL (ref 145–400)
RBC: 4.14 10*6/uL (ref 3.70–5.45)
RDW: 13.8 % (ref 11.2–14.5)
WBC: 8 10*3/uL (ref 3.9–10.3)
lymph#: 1.1 10*3/uL (ref 0.9–3.3)

## 2010-03-18 LAB — LACTATE DEHYDROGENASE: LDH: 120 U/L (ref 94–250)

## 2010-03-18 LAB — CANCER ANTIGEN 27.29: CA 27.29: 23 U/mL (ref 0–39)

## 2010-04-03 LAB — CONVERTED CEMR LAB: Hgb A1c MFr Bld: 5.8 % — ABNORMAL HIGH (ref <5.7)

## 2010-07-16 ENCOUNTER — Ambulatory Visit: Payer: Self-pay | Admitting: Family Medicine

## 2010-07-16 ENCOUNTER — Other Ambulatory Visit: Admission: RE | Admit: 2010-07-16 | Discharge: 2010-07-16 | Payer: Self-pay | Admitting: Family Medicine

## 2010-07-19 ENCOUNTER — Encounter: Payer: Self-pay | Admitting: Family Medicine

## 2010-07-19 LAB — CONVERTED CEMR LAB: Pap Smear: NEGATIVE

## 2010-07-23 DIAGNOSIS — E559 Vitamin D deficiency, unspecified: Secondary | ICD-10-CM | POA: Insufficient documentation

## 2010-09-12 ENCOUNTER — Ambulatory Visit: Payer: Self-pay | Admitting: Oncology

## 2010-09-17 ENCOUNTER — Encounter: Payer: Self-pay | Admitting: Family Medicine

## 2010-09-18 ENCOUNTER — Encounter: Payer: Self-pay | Admitting: Family Medicine

## 2010-09-18 LAB — CONVERTED CEMR LAB
ALT: 10 units/L (ref 0–35)
Albumin: 4 g/dL (ref 3.5–5.2)
Bilirubin, Direct: 0.1 mg/dL (ref 0.0–0.3)
Cholesterol: 165 mg/dL (ref 0–200)
HDL: 70 mg/dL (ref >39)
Total CHOL/HDL Ratio: 2.4
VLDL: 9 mg/dL (ref 0–40)

## 2010-10-20 LAB — CONVERTED CEMR LAB
AST: 15 units/L (ref 0–37)
AST: 16 units/L (ref 0–37)
Albumin: 4.4 g/dL (ref 3.5–5.2)
Alkaline Phosphatase: 90 units/L (ref 39–117)
Bilirubin, Direct: 0.1 mg/dL (ref 0.0–0.3)
HDL: 83 mg/dL (ref >39)
LDL Cholesterol: 102 mg/dL — ABNORMAL HIGH (ref 0–99)
Total Bilirubin: 0.3 mg/dL (ref 0.3–1.2)
Total Bilirubin: 0.4 mg/dL (ref 0.3–1.2)
Total CHOL/HDL Ratio: 2.8
Triglycerides: 53 mg/dL (ref <150)

## 2010-10-22 NOTE — Progress Notes (Signed)
Summary: hematology/ medical oncology  hematology/ medical oncology   Imported By: Lind Guest 04/08/2010 10:16:09  _____________________________________________________________________  External Attachment:    Type:   Image     Comment:   External Document

## 2010-10-22 NOTE — Assessment & Plan Note (Signed)
Summary: OV   Vital Signs:  Patient profile:   65 year old female Menstrual status:  hysterectomy Height:      56 inches Weight:      136 pounds BMI:     30.60 O2 Sat:      95 % Pulse rate:   94 / minute Pulse rhythm:   regular Resp:     16 per minute BP sitting:   110 / 80  (left arm) Cuff size:   regular  Vitals Entered By: Everitt Amber LPN (January 01, 2010 1:25 PM)  Nutrition Counseling: Patient's BMI is greater than 25 and therefore counseled on weight management options. CC: Follow up chronic problems   CC:  Follow up chronic problems.  History of Present Illness: Reports  that she has been doing well. Denies recent fever or chills. Denies sinus pressure, nasal congestion , ear pain or sore throat. Denies chest congestion, or cough productive of sputum. Denies chest pain, palpitations, PND, orthopnea or leg swelling. Denies abdominal pain, nausea, vomitting, diarrhea or constipation. Denies change in bowel movements or bloody stool. Denies dysuria , frequency, incontinence or hesitancy. Denies  joint pain, swelling, or reduced mobility. Denies headaches, vertigo, seizures. Denies depression, anxiety or insomnia. Denies  rash, lesions, or itch. The pt is invested in regular exercise , she walks at least 3 days per week, and intends to incase this as the weather improves. Sheis concerned about weight gain, and is watching her diet, her psychotropic meds likely contreibut to the weight gain.     Current Medications (verified): 1)  Prevacid 30 Mg  Cpdr (Lansoprazole) .... One Cap By Mouth Once Daily 2)  Reglan 10 Mg  Tabs (Metoclopramide Hcl) .... One Tab By Mouth Before Meals and At Bedtime 3)  Lovastatin 40 Mg Tabs (Lovastatin) .... Take 1 Tab By Mouth At Bedtime  Allergies (verified): No Known Drug Allergies  Past History:  Past Medical History: hyperlipidemia R breast cancer, chemo and radiaition therapy Depression GERD  Review of Systems      See  HPI General:  Complains of fatigue. Eyes:  Denies blurring and discharge. Endo:  Denies cold intolerance, excessive hunger, excessive thirst, excessive urination, heat intolerance, polyuria, and weight change. Heme:  Denies abnormal bruising and bleeding. Allergy:  Complains of seasonal allergies; mild symptoms, nasal congestion intermittently.  Physical Exam  General:  Well-developed,well-nourished,in no acute distress; alert,appropriate and cooperative throughout examination HEENT: No facial asymmetry,  EOMI, No sinus tenderness, TM's Clear, oropharynx  pink and moist.   Chest: Clear to auscultation bilaterally.  CVS: S1, S2, No murmurs, No S3.   Abd: Soft, Nontender.  MS: Adequate ROM spine, hips, shoulders and knees.  Ext: No edema.   CNS: CN 2-12 intact, power tone and sensation normal throughout.   Skin: Intact, no visible lesions or rashes.  Psych: Good eye contact, normal affect.  Memory intact, not anxious or depressed appearing.    Impression & Recommendations:  Problem # 1:  OVERWEIGHT (ICD-278.02) Assessment Deteriorated  Ht: 56 (01/01/2010)   Wt: 136 (01/01/2010)   BMI: 30.60 (01/01/2010)  Problem # 2:  HYPERLIPIDEMIA (ICD-272.4) Assessment: Comment Only  Her updated medication list for this problem includes:    Lovastatin 40 Mg Tabs (Lovastatin) .Marland Kitchen... Take 1 tab by mouth at bedtime  Orders: T-Hepatic Function [id number]) T-Lipid Profile [phone])  Labs Reviewed: SGOT: 16 (10/18/2009)   SGPT: 11 (10/18/2009)   HDL:83 (10/18/2009), 79 (06/28/2009)  LDL:91 (10/18/2009), 82 (06/28/2009)  Chol:185 (10/18/2009), 172 (06/28/2009)  Trig:53 (10/18/2009), 55 (06/28/2009)  Problem # 3:  NEOPLASM, MALIGNANT, BREAST, RIGHT (ICD-174.9) Assessment: Improved pt has completed chemo and radiation, sees onc every 6 months at this time  Problem # 4:  DEPRESSION (ICD-311) Assessment: Improved sees mental health provider  Problem # 5:  GERD (ICD-530.81) Assessment:  Improved  Her updated medication list for this problem includes:    Prevacid 30 Mg Cpdr (Lansoprazole) ..... One cap by mouth once daily  Complete Medication List: 1)  Prevacid 30 Mg Cpdr (Lansoprazole) .... One cap by mouth once daily 2)  Reglan 10 Mg Tabs (Metoclopramide hcl) .... One tab by mouth before meals and at bedtime 3)  Lovastatin 40 Mg Tabs (Lovastatin) .... Take 1 tab by mouth at bedtime  Other Orders: T-Basic Metabolic Panel [phone])  Patient Instructions: 1)  f/u in 5.5 months 2)  It is important that you exercise regularly at least 20 minutes 5 times a week. If you develop chest pain, have severe difficulty breathing, or feel very tired , stop exercising immediately and seek medical attention. 3)  You need to lose weight. Consider a lower calorie diet and regular exercise.  4)  BMP prior to visit, ICD-9: 5)  Hepatic Panel prior to visit, ICD-9: fasting end July or early August 6)  Lipid Panel prior to visit, ICD-9:

## 2010-10-22 NOTE — Assessment & Plan Note (Signed)
Summary: physical   Vital Signs:  Patient profile:   65 year old [name] Menstrual status:  hysterectomy Height:      56 inches Weight:      141.75 pounds BMI:     31.89 O2 Sat:      97 % on Room air Pulse rate:   98 / minute Pulse rhythm:   regular Resp:     Tara per minute BP sitting:   120 / 82  (left arm)  Vitals Entered By: Mauricia Area CMA (July 16, 2010 11:19 AM)  Nutrition Counseling: Patient's BMI is greater than 25 and therefore counseled on weight management options.  O2 Flow:  Room air CC: physical   CC:  physical.  History of Present Illness: Reports  that she has been doing well. Denies recent fever or chills. Denies sinus pressure, nasal congestion , ear pain or sore throat. Denies chest congestion, or cough productive of sputum. Denies chest pain, palpitations, PND, orthopnea or leg swelling. Denies abdominal pain, nausea, vomitting, diarrhea or constipation. Denies change in bowel movements or bloody stool. Denies dysuria , frequency, incontinence or hesitancy. Denies  joint pain, swelling, or reduced mobility. Denies headaches, vertigo, seizures. Denies depression, anxiety or insomnia.Controlled on meds Denies  rash, lesions, or itch.     Allergies (verified): No Known Drug Allergies  Review of Systems      See HPI Eyes:  Denies discharge, eye pain, and red eye. Endo:  Denies cold intolerance, excessive hunger, excessive thirst, and heat intolerance. Heme:  Denies abnormal bruising and bleeding. Allergy:  Complains of seasonal allergies; denies hives or rash and itching eyes; mild.  Physical Exam  General:  Well-developed,well-nourished,in no acute distress; alert,appropriate and cooperative throughout examination Head:  Normocephalic and atraumatic without obvious abnormalities. No apparent alopecia or balding. Eyes:  No corneal or conjunctival inflammation noted. EOMI. Perrla. Funduscopic exam benign, without hemorrhages, exudates or  papilledema. Vision grossly normal. Ears:  External ear exam shows no significant lesions or deformities.  Otoscopic examination reveals clear canals, tympanic membranes are intact bilaterally without bulging, retraction, inflammation or discharge. Hearing is grossly normal bilaterally. Nose:  External nasal examination shows no deformity or inflammation. Nasal mucosa are pink and moist without lesions or exudates. Mouth:  pharynx pink and moist, fair dentition, and teeth missing.   Neck:  No deformities, masses, or tenderness noted. Chest Wall:  No deformities, masses, or tenderness noted. Breasts:  No mass, nodules, thickening, tenderness, bulging, retraction, inflamation, nipple discharge or skin changes noted.   Lungs:  Normal respiratory effort, chest expands symmetrically. Lungs are clear to auscultation, no crackles or wheezes. Heart:  Normal rate and regular rhythm. S1 and S2 normal without gallop, murmur, click, rub or other extra sounds. Abdomen:  Bowel sounds positive,abdomen soft and non-tender without masses, organomegaly or hernias noted. Rectal:  No external abnormalities noted. Normal sphincter tone. No rectal masses or tenderness. Genitalia:  normal introitus, no friaility or hemorrhage, and no adnexal masses or tenderness.  Uterus abssent Msk:  No deformity or scoliosis noted of thoracic or lumbar spine.   Pulses:  R and L carotid,radial,femoral,dorsalis pedis and posterior tibial pulses are full and equal bilaterally Extremities:  No clubbing, cyanosis, edema, or deformity noted with normal full range of motion of all joints.   Neurologic:  No cranial nerve deficits noted. Station and gait are normal. Plantar reflexes are down-going bilaterally. DTRs are symmetrical throughout. Sensory, motor and coordinative functions appear intact. Skin:  Intact without suspicious lesions or rashes  Cervical Nodes:  No lymphadenopathy noted Axillary Nodes:  No palpable lymphadenopathy Inguinal  Nodes:  No significant adenopathy Psych:  Cognition and judgment appear intact. Alert and cooperative with normal attention span and concentration. No apparent delusions, illusions, hallucinations   Impression & Recommendations:  Problem # 1:  OVERWEIGHT (ICD-278.02) Assessment Deteriorated  Ht: 56 (07/16/2010)   Wt: 141.75 (07/16/2010)   BMI: 31.89 (07/16/2010) therapeutic lifestyle change discussed and encouraged  Problem # 2:  GERD (ICD-530.81) Assessment: Unchanged  Her updated medication list for this problem includes:    Prevacid 30 Mg Cpdr (Lansoprazole) ..... One cap by mouth once daily  Orders: Medicare Electronic Prescription [id number])  Problem # 3:  HYPERLIPIDEMIA (ICD-272.4) Assessment: Comment Only  Her updated medication list for this problem includes:    Lovastatin 40 Mg Tabs (Lovastatin) .Marland Kitchen... Take 1 tab by mouth at bedtime Low fat dietdiscussed and encouraged labs due Labs Reviewed: SGOT: 15 (03/18/2010)   SGPT: 9 (03/18/2010)   HDL:66 (03/18/2010), 83 (10/18/2009)  LDL:102 (03/18/2010), 91 (60/45/4098)  Chol:182 (03/18/2010), 185 (10/18/2009)  Trig:72 (03/18/2010), 53 (10/18/2009)  Problem # 4:  VITAMIN D DEFICIENCY (ICD-268.9) Assessment: Comment Only pt on once weekly vit D, need to follow this  Complete Medication List: 1)  Prevacid 30 Mg Cpdr (Lansoprazole) .... One cap by mouth once daily 2)  Reglan 10 Mg Tabs (Metoclopramide hcl) .... One tab by mouth before meals and at bedtime 3)  Lovastatin 40 Mg Tabs (Lovastatin) .... Take 1 tab by mouth at bedtime 4)  Vitamin D (ergocalciferol) 50000 Unit Caps (Ergocalciferol) .... One capsule once weekly  Other Orders: T-CBC w/Diff [id number]) T-Lipid Profile [id number]) T-Hepatic Function [phone]) T-Vitamin D (25-Hydroxy) [id number]) Hemoccult Guaiac-1 spec.(in office) [id number])  Patient Instructions: 1)  Follow up appointment in 5.59months 2)  It is important that you exercise regularly at least  20 minutes 5 times a week. If you develop chest pain, have severe difficulty breathing, or feel very tired , stop exercising immediately and seek medical attention. 3)  You need to lose weight. Consider a lower calorie diet and regular exercise.  4)  Hepatic Panel prior to visit, ICD-9: 5)  Lipid Panel prior to visit, ICD-9:   fasting end december 6)  CBC w/ Diff prior to visit, ICD-9: 7)  Vit D 8)  Happy holidays 9)  immunization recommende   Orders Added: 1)  Est. Patient 40-64 years [99396] 2)  T-CBC w/Diff [[id number]] 3)  T-Lipid Profile [[id number]] 4)  T-Hepatic Function [[id number]] 5)  T-Vitamin D (25-Hydroxy) [[id number]] 6)  Hemoccult Guaiac-1 spec.(in office) [82270] 7)  Medicare Electronic Prescription [G8553]    Laboratory Results  Date/Time Received: July 16, 2010 12:07 PM  Date/Time Reported: July 16, 2010 12:07 PM   Stool - Occult Blood Hemmoccult #1: negative Date: 07/16/2010 Comments: 5030 5/14 50590 1L 03/12 Adella Hare LPN  July 16, 2010 12:08 PM

## 2010-10-22 NOTE — Letter (Signed)
Summary: Laboratory/X-Ray Results  [facility]  [address]   [place], [place] [address]   Phone: [phone]  Fax: [phone]    Lab/X-Ray Results  July 19, 2010  MRN: [id number]  Tara [name] [address] [place], [place]  [address]    The results of your recent pap smear has been reviewed and were found: NORMAL    If you have any questions, please contact our office.     Adella Hare LPN

## 2010-10-22 NOTE — Progress Notes (Signed)
  Phone Note Other Incoming   Caller: dr simpson Summary of Call: pls call Tara [name] advise fasting sugarelevated at 107, needs to reduce carbs and sugar and lose weight. also vit d is low.  needsa hBA1C, drawn to ensure niot diabetic, also needs to start vit d supplements and prescription vit D take otc calcium/vit D1200mg /1000IU one daily, i will sent in the vit d script let her know.  labs came from the onc clinic let herknow  Initial call taken by: Syliva Overman MD,  March 18, 2010 5:18 PM  Follow-up for Phone Call        patient aware and order faxed to lab Follow-up by: Adella Hare LPN,  March 19, 2010 2:39 PM    New/Updated Medications: VITAMIN D (ERGOCALCIFEROL) 50000 UNIT CAPS (ERGOCALCIFEROL) one capsule once weekly Prescriptions: VITAMIN D (ERGOCALCIFEROL) 50000 UNIT CAPS (ERGOCALCIFEROL) one capsule once weekly  #4 x 4   Entered and Authorized by:   Syliva Overman MD   Signed by:   Syliva Overman MD on 03/18/2010   Method used:   Electronically to        [facility] (retail)       [address]       [place], [place]  [address]       Ph: [phone]       Fax: [phone]   RxID:   [id number]

## 2010-10-22 NOTE — Progress Notes (Signed)
[facility]   Imported By: Lind Guest 11/06/2009 09:53:44  _____________________________________________________________________  External Attachment:    Type:   Image     Comment:   External Document

## 2010-10-22 NOTE — Progress Notes (Signed)
Summary: medicine  Phone Note Call from Patient   Summary of Call: wants to know will her reglan be refilled call her back and let her know she left message SHE IS OUT AND NEEDS IT Initial call taken by: Lind Guest,  Jan 21, 2010 7:51 AM  Follow-up for Phone Call        advised was refilled friday at [facility] Follow-up by: Adella Hare LPN,  Jan 21, 1609 10:03 AM

## 2010-11-06 ENCOUNTER — Other Ambulatory Visit: Payer: Self-pay | Admitting: Oncology

## 2010-11-06 DIAGNOSIS — Z9889 Other specified postprocedural states: Secondary | ICD-10-CM

## 2010-12-16 ENCOUNTER — Other Ambulatory Visit: Payer: Self-pay | Admitting: Family Medicine

## 2010-12-23 ENCOUNTER — Other Ambulatory Visit: Payer: Self-pay | Admitting: Family Medicine

## 2010-12-26 ENCOUNTER — Encounter: Payer: Self-pay | Admitting: Family Medicine

## 2010-12-30 ENCOUNTER — Encounter: Payer: Self-pay | Admitting: Family Medicine

## 2010-12-31 ENCOUNTER — Encounter: Payer: Self-pay | Admitting: Family Medicine

## 2010-12-31 ENCOUNTER — Ambulatory Visit (INDEPENDENT_AMBULATORY_CARE_PROVIDER_SITE_OTHER): Payer: Medicare Other | Admitting: Family Medicine

## 2010-12-31 VITALS — BP 118/78 | HR 100 | Resp 16 | Ht 59.25 in | Wt 145.8 lb

## 2010-12-31 DIAGNOSIS — G4700 Insomnia, unspecified: Secondary | ICD-10-CM

## 2010-12-31 DIAGNOSIS — R059 Cough, unspecified: Secondary | ICD-10-CM

## 2010-12-31 DIAGNOSIS — E663 Overweight: Secondary | ICD-10-CM

## 2010-12-31 DIAGNOSIS — R05 Cough: Secondary | ICD-10-CM

## 2010-12-31 DIAGNOSIS — R5383 Other fatigue: Secondary | ICD-10-CM

## 2010-12-31 DIAGNOSIS — R5381 Other malaise: Secondary | ICD-10-CM

## 2010-12-31 DIAGNOSIS — Z853 Personal history of malignant neoplasm of breast: Secondary | ICD-10-CM

## 2010-12-31 DIAGNOSIS — F329 Major depressive disorder, single episode, unspecified: Secondary | ICD-10-CM

## 2010-12-31 DIAGNOSIS — F411 Generalized anxiety disorder: Secondary | ICD-10-CM

## 2010-12-31 DIAGNOSIS — F32A Depression, unspecified: Secondary | ICD-10-CM

## 2010-12-31 DIAGNOSIS — E559 Vitamin D deficiency, unspecified: Secondary | ICD-10-CM

## 2010-12-31 DIAGNOSIS — E785 Hyperlipidemia, unspecified: Secondary | ICD-10-CM

## 2010-12-31 MED ORDER — BENZONATATE 100 MG PO CAPS: 100.0000 mg | ORAL_CAPSULE | Freq: Three times a day (TID) | ORAL | Status: AC | PRN

## 2010-12-31 NOTE — Progress Notes (Signed)
  Subjective:    Patient ID: Tara [name], female    DOB: 03-Nov-1945, 65 y.o.   MRN: [id number]  HPI  Pt reports that she is now seeing a new psychiatrist since April due to ins coverage, and there is an attempt to stop the ritalin and substiute with omega 3 and folic acid, and may also get an increase in her zoloft if needed She recently was exposed to family member sneezing a lot and gives a 3 day h/o uncomtrolled allergy symptoms, to include sneezing, cough, watery eyes etc.No fever or chills, using mucinex with relief, will add benadryl  Review of Systems Denies recent fever or chills. Denies sinus pressure, nasal congestion, ear pain or sore throat. Denies chest congestion, productive cough or wheezing. Denies chest pains, palpitations, paroxysmal nocturnal dyspnea, orthopnea and leg swelling Denies abdominal pain, nausea, vomiting,diarrhea or constipation.  Denies rectal bleeding or change in bowel movement. Denies dysuria, frequency, hesitancy or incontinence. Denies joint pain, swelling and limitation in mobility. Denies headaches, seizure, numbness, or tingling. Denies depression, anxiety or insomnia. Denies skin break down or rash.        Objective:   Physical Exam Patient alert and oriented and in no Cardiopulmonary distress.  HEENT: No facial asymmetry, EOMI, no sinus tenderness, TM's clear, Oropharynx pink and moist.  Neck supple no adenopathy.  Chest: Clear to auscultation bilaterally.  CVS: S1, S2 no murmurs, no S3.  ABD: Soft non tender. Bowel sounds normal.  Ext: No edema  MS: Adequate ROM spine, shoulders, hips and knees.  Skin: Intact, no ulcerations or rash noted.  Psych: Good eye contact, normal affect. Memory intact not anxious or depressed appearing.  CNS: CN 2-12 intact, power, tone and sensation normal throughout.        Assessment & Plan:

## 2010-12-31 NOTE — Patient Instructions (Signed)
F/U in 6 months  It is important that you exercise regularly at least 30 minutes 5 times a week. If you develop chest pain, have severe difficulty breathing, or feel very tired, stop exercising immediately and seek medical attention   A healthy diet is rich in fruit, vegetables and whole grains. Poultry fish, nuts and beans are a healthy choice for protein rather then red meat. A low sodium diet and drinking 64 ounces of water daily is generally recommended. Oils and sweet should be limited. Carbohydrates especially for those who are diabetic or overweight, should be limited to 34-45 gram per meal. It is important to eat on a regular schedule, at least 3 times daily. Snacks should be primarily fruits, vegetables or nuts. Your December labs are great, continue current meds.   CBC, fasting chem 7, lipid, hepatic and tsh. In 6 months, just before your next visit.  Medication for cough has been sent in. Pls add benadryl 1 at night for uncontrolled allergies

## 2011-01-14 ENCOUNTER — Telehealth: Payer: Self-pay | Admitting: Family Medicine

## 2011-01-14 ENCOUNTER — Other Ambulatory Visit: Payer: Self-pay | Admitting: Family Medicine

## 2011-01-15 ENCOUNTER — Telehealth (INDEPENDENT_AMBULATORY_CARE_PROVIDER_SITE_OTHER): Payer: Medicare Other | Admitting: Family Medicine

## 2011-01-15 DIAGNOSIS — K219 Gastro-esophageal reflux disease without esophagitis: Secondary | ICD-10-CM

## 2011-01-16 ENCOUNTER — Other Ambulatory Visit (INDEPENDENT_AMBULATORY_CARE_PROVIDER_SITE_OTHER): Payer: Medicare Other | Admitting: Family Medicine

## 2011-01-16 DIAGNOSIS — K219 Gastro-esophageal reflux disease without esophagitis: Secondary | ICD-10-CM

## 2011-01-16 MED ORDER — LANSOPRAZOLE 30 MG PO CPDR: 30.0000 mg | DELAYED_RELEASE_CAPSULE | Freq: Every day | ORAL | Status: DC

## 2011-01-16 NOTE — Telephone Encounter (Signed)
Sent in and patient aware 

## 2011-01-17 MED ORDER — LANSOPRAZOLE 30 MG PO CPDR: 30.0000 mg | DELAYED_RELEASE_CAPSULE | Freq: Every day | ORAL | Status: DC

## 2011-01-19 ENCOUNTER — Encounter: Payer: Self-pay | Admitting: Family Medicine

## 2011-01-19 NOTE — Assessment & Plan Note (Signed)
Improved, management per psych

## 2011-01-19 NOTE — Assessment & Plan Note (Signed)
Improved, management per psych, no med cjhanges

## 2011-01-19 NOTE — Assessment & Plan Note (Signed)
Importance of weekly supplement to lower osteoperosis risk discussed

## 2011-01-19 NOTE — Assessment & Plan Note (Signed)
Deteriorated. Patient re-educated about  the importance of commitment to a  minimum of 150 minutes of exercise per week. The importance of healthy food choices with portion control discussed. Encouraged to start a food diary, count calories and to consider  joining a support group. Sample diet sheets offered. Goals set by the patient for the next several months.    

## 2011-01-19 NOTE — Assessment & Plan Note (Signed)
Improved, no med change 

## 2011-01-20 ENCOUNTER — Other Ambulatory Visit: Payer: Self-pay | Admitting: Family Medicine

## 2011-01-20 NOTE — Telephone Encounter (Deleted)
Error do not use rx refill pool

## 2011-01-20 NOTE — Telephone Encounter (Signed)
error 

## 2011-01-27 ENCOUNTER — Ambulatory Visit
Admission: RE | Admit: 2011-01-27 | Discharge: 2011-01-27 | Disposition: A | Discharge status: Home / Self Care | Payer: Medicare Other | Source: Ambulatory Visit | Attending: Oncology | Admitting: Oncology

## 2011-01-27 DIAGNOSIS — Z9889 Other specified postprocedural states: Secondary | ICD-10-CM

## 2011-02-04 NOTE — Op Note (Signed)
NAME:  Tara [name], Tara [name]                ACCOUNT NO.:  [id number]   MEDICAL RECORD NO.:  [id number]          PATIENT TYPE:  AMB   LOCATION:  [facility]                          FACILITY:  [facility]   PHYSICIAN:  Rose Phi. Maple Hudson, M.D.   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   OPERATION:  1. Right axillary lymph node dissection.  2. Insertion of Port-A-Cath under fluoroscopic control.   SURGEON:  Rose Phi. Maple Hudson, M.D.   ANESTHESIA:  General.   OPERATIVE PROCEDURE:  This 65 year old female had a less than 2 cm  carcinoma in the upper outer quadrant of her right breast and underwent  a lumpectomy and sentinel node biopsy.  The sentinel node was originally  interpreted as negative but turned out to be positive on permanent  sections.  She is ER/PR negative and is scheduled for chemotherapy.  We  are bringing her back now for completion axillary node dissection and  insertion of Port-A-Cath.   After suitable general anesthesia was induced, the patient was placed in  a supine position with the arms extended on the arm board.  The right  breast and axilla were prepped and draped in the usual fashion.   The previous incision used for the sentinel node biopsy was incised and  then extended anteriorly and posteriorly.  I dissected through the fatty  tissue and down to identify the pectoralis major muscle.  I developed a  skin flap going in a superior direction and then dissected along the  pectoralis minor muscle.  We approached the axillary vein and divided  the clavipectoral fascia and then retracted the pectoralis major and  incised along the pectoralis minor.  We then removed by dissection all  the tissue inferior to the vein and from behind the pectoralis minor  muscle.  Long  thoracic and thoracodorsal nerves were identified and  preserved.  Other cutaneous nerves were clipped and divided.  This gave  [place] a good level I and level II dissection.   We then thoroughly irrigated the field with saline.  We had good  hemostasis.  A #19-French Blake drain was inserted and brought out  through a separate stab wound.  Subcutaneous tissue was closed then with  3-0 Vicryl and the skin with staples.  A dressing was applied.   We then took all the drapes off and fixed her arms down by her side.  We  then prepped and draped the left upper chest and neck.   After having changed instruments and gloves and gown, a left subclavian  puncture was carried out without difficulty and the guidewire inserted  and proper positioning confirmed by fluoroscopy.   I made an incision on the anterior chest wall and developed a pocket for  the implantable port.  I tunneled between the subclavian puncture site  and the pocket and passed the catheter through that, attached  it to the  port and placed the port in the pocket.   With the catheter laid out on the chest wall I divided it at the level  of the fourth interspace, which would be compatible with the cavoatrial  junction.   The dilator and peel-away sheath were passed over the wire.  The wire  was removed, as was the dilator, and then the catheter passed through  the peel-away sheath and then the sheath was removed.   Again C-arm fluoroscopy confirmed that there was no kinking in the  system and that the catheter tip was in the superior vena cava.   We then anchored the port in the pocket with two 2-0 Prolene sutures.  I  accessed it and fully heparinized it.  We then closed the incision with  3-0 Vicryl and a subcuticular 4-0 Monocryl and Dermabond.   Dressings were applied and the patient transferred to the recovery room  in satisfactory condition, having tolerated procedure well.      Rose Phi. Maple Hudson, M.D.  Electronically  Signed     PRY/MEDQ  D:  03/08/2007  T:  03/09/2007  Job:  [id number]

## 2011-02-04 NOTE — Op Note (Signed)
NAME:  Tara [name], Tara [name]                ACCOUNT NO.:  [id number]   MEDICAL RECORD NO.:  [id number]          PATIENT TYPE:  AMB   LOCATION:  DSC                          FACILITY:  [facility]   PHYSICIAN:  Rose Phi. Maple Hudson, M.D.   DATE OF BIRTH:  Oct 29, 1945   DATE OF PROCEDURE:  02/03/2007  DATE OF DISCHARGE:                               OPERATIVE REPORT   PREOPERATIVE DIAGNOSIS:  Stage I carcinoma of the right breast.   POSTOPERATIVE DIAGNOSIS:  Stage I carcinoma of the right breast.   OPERATION:  1. Blue dye injection.  2. Right sentinel lymph node biopsy.  3. Right partial mastectomy.   SURGEON:  Dr. Francina Ames.   ANESTHESIA:  General.   OPERATIVE PROCEDURE:  Prior to coming to the operating room, 1 mCi of  technetium sulfur colloid was injected intradermally.   After suitable general anesthesia was induced, the patient was placed in  the supine position with the arms extended on the arm board.  Five mL of  a mixture of 2 mL of methylene blue and 3 mL of injectable saline was  injected in the subareolar tissue and the breast gently massaged for  three minutes.  We then prepped and draped the breast and axilla.   Careful scanning of the axilla revealed a hot spot in the right axilla.  A short transverse axillary incision was made with dissection through  the subcutaneous tissue to the clavipectoral fascia.  Just deep to the  fascia was one blue and hot lymph node with a lymphatic going into it.  I removed that as a sentinel node.  There were no other blue hot or  palpable nodes.   While that was being evaluated by the pathologist for metastatic  disease, a curved incision over the palpable tumor in the upper outer  quadrant of the right breast was outlined and then the incision was made  and a wide excision of the tumor and surrounding tissue was carried out.  The specimen was oriented for the pathologist and submitted for  evaluation of margins.   Sentinel node was reported as  negative for metastatic disease and the  margins reported as clear.   Both incisions were injected with the local anesthetic mixture.  Both  incisions were closed in two layers of 3-0 Vicryl and subcuticular 4-0  Monocryl and Steri-Strips.   Dressings were applied and the patient transferred to recovery room in  satisfactory condition, having tolerated the procedure well.      Rose Phi. Maple Hudson, M.D.  Electronically Signed     PRY/MEDQ  D:  02/03/2007  T:  02/03/2007  Job:  [id number]

## 2011-02-04 NOTE — Op Note (Signed)
NAME:  LAKEDRA, WASHINGTON NO.:  [id number]   MEDICAL RECORD NO.:  [id number]          PATIENT TYPE:  AMB   LOCATION:  DSC                          FACILITY:  [facility]   PHYSICIAN:  Cherylynn Ridges, M.D.    DATE OF BIRTH:  [birth year]   DATE OF PROCEDURE:  09/05/2008  DATE OF DISCHARGE:                               OPERATIVE REPORT   PREOPERATIVE DIAGNOSIS:  History of breast cancer with a left subclavian  Port-A-Catheter in place.   POSTOPERATIVE DIAGNOSIS:  History of breast cancer with a left  subclavian Port-A-Catheter in place.   PROCEDURE:  Removal of Port-A-Cath.   SURGEON:  Cherylynn Ridges, MD   It was done in [facility] in the local procedure room.   No complications.   CONDITION:  Stable.   OPERATIVE PROCEDURE:  The patient was placed on the table in supine  position.  She was prepped with Betadine and we anesthetized was a 1%  Xylocaine with epi at the incision site of her previous Port-A-Cath,  which was in the left chest wall.   Once we had anesthetized, we made a transverse incision using #15 blade  down to the subcutaneous tissue.  We made it near the hub  in connection  of the catheter to the port.  We grabbed the catheter distally and then  pulled it out of this tunnel compressing it at the subclavian site as we  dissected out the port, which was subsequently removed.  There were  permanent stitches in place, which were removed with the catheter.  There was adequate hemostasis.  We closed in a single layer using a  running subcuticular stitch of 4-0 Monocryl.  Dermabond, Steri-Strips,  and Tegaderm were applied.  All counts were correct.       Cherylynn Ridges, M.D.  Electronically Signed     JOW/MEDQ  D:  09/06/2008  T:  09/06/2008  Job:  [id number]

## 2011-02-10 ENCOUNTER — Other Ambulatory Visit: Payer: Self-pay | Admitting: Family Medicine

## 2011-02-18 ENCOUNTER — Other Ambulatory Visit: Payer: Self-pay | Admitting: Family Medicine

## 2011-03-18 ENCOUNTER — Encounter (HOSPITAL_BASED_OUTPATIENT_CLINIC_OR_DEPARTMENT_OTHER): Payer: Medicare Other | Admitting: Oncology

## 2011-03-18 ENCOUNTER — Other Ambulatory Visit: Payer: Self-pay | Admitting: Family Medicine

## 2011-03-18 ENCOUNTER — Other Ambulatory Visit: Payer: Self-pay | Admitting: Oncology

## 2011-03-18 DIAGNOSIS — C50419 Malignant neoplasm of upper-outer quadrant of unspecified female breast: Secondary | ICD-10-CM

## 2011-03-18 DIAGNOSIS — Z171 Estrogen receptor negative status [ER-]: Secondary | ICD-10-CM

## 2011-03-18 DIAGNOSIS — Z170 Estrogen receptor positive status [ER+]: Secondary | ICD-10-CM

## 2011-03-18 LAB — COMPREHENSIVE METABOLIC PANEL
AST: 16 U/L (ref 0–37)
Albumin: 3.9 g/dL (ref 3.5–5.2)
BUN: 18 mg/dL (ref 6–23)
CO2: 27 mEq/L (ref 19–32)
Calcium: 8.9 mg/dL (ref 8.4–10.5)
Chloride: 105 mEq/L (ref 96–112)
Creatinine, Ser: 0.92 mg/dL (ref 0.50–1.10)
Glucose, Bld: 101 mg/dL — ABNORMAL HIGH (ref 70–99)
Potassium: 4 mEq/L (ref 3.5–5.3)

## 2011-03-18 LAB — CBC WITH DIFFERENTIAL/PLATELET
Eosinophils Absolute: 0.3 10*3/uL (ref 0.0–0.5)
HCT: 34 % — ABNORMAL LOW (ref 34.8–46.6)
LYMPH%: 14.5 % (ref 14.0–49.7)
MONO#: 0.4 10*3/uL (ref 0.1–0.9)
NEUT#: 6.3 10*3/uL (ref 1.5–6.5)
NEUT%: 78 % — ABNORMAL HIGH (ref 38.4–76.8)
Platelets: 172 10*3/uL (ref 145–400)
WBC: 8.1 10*3/uL (ref 3.9–10.3)

## 2011-03-18 LAB — HEPATIC FUNCTION PANEL
ALT: 11 U/L (ref 0–35)
AST: 16 U/L (ref 0–37)
Total Protein: 6.8 g/dL (ref 6.0–8.3)

## 2011-03-18 LAB — CANCER ANTIGEN 27.29: CA 27.29: 19 U/mL (ref 0–39)

## 2011-03-18 LAB — LIPID PANEL
Cholesterol: 187 mg/dL (ref 0–200)
HDL: 64 mg/dL
LDL Cholesterol: 110 mg/dL — ABNORMAL HIGH (ref 0–99)
Total CHOL/HDL Ratio: 2.9 ratio
Triglycerides: 63 mg/dL
VLDL: 13 mg/dL (ref 0–40)

## 2011-03-18 LAB — TSH: TSH: 1.299 u[IU]/mL (ref 0.350–4.500)

## 2011-03-18 LAB — VITAMIN D 25 HYDROXY (VIT D DEFICIENCY, FRACTURES): Vit D, 25-Hydroxy: 50 ng/mL (ref 30–89)

## 2011-05-12 ENCOUNTER — Other Ambulatory Visit: Payer: Self-pay | Admitting: Family Medicine

## 2011-06-09 ENCOUNTER — Other Ambulatory Visit: Payer: Self-pay | Admitting: Family Medicine

## 2011-06-12 ENCOUNTER — Other Ambulatory Visit: Payer: Self-pay | Admitting: Family Medicine

## 2011-07-04 LAB — CROSSMATCH: ABO/RH(D): B POS

## 2011-07-04 LAB — SAMPLE TO BLOOD BANK

## 2011-07-04 LAB — ABO/RH: ABO/RH(D): B POS

## 2011-07-09 LAB — POCT HEMOGLOBIN-HEMACUE: Operator id: 116011

## 2011-07-18 ENCOUNTER — Encounter: Payer: Self-pay | Admitting: Family Medicine

## 2011-07-22 ENCOUNTER — Encounter: Payer: Self-pay | Admitting: Family Medicine

## 2011-07-22 ENCOUNTER — Ambulatory Visit: Payer: Medicare Other | Admitting: Family Medicine

## 2011-07-22 ENCOUNTER — Ambulatory Visit (INDEPENDENT_AMBULATORY_CARE_PROVIDER_SITE_OTHER): Payer: Medicare Other | Admitting: Family Medicine

## 2011-07-22 VITALS — BP 118/82 | HR 69 | Resp 16 | Ht 59.25 in | Wt 152.8 lb

## 2011-07-22 DIAGNOSIS — Z23 Encounter for immunization: Secondary | ICD-10-CM

## 2011-07-22 DIAGNOSIS — R5381 Other malaise: Secondary | ICD-10-CM

## 2011-07-22 DIAGNOSIS — E785 Hyperlipidemia, unspecified: Secondary | ICD-10-CM

## 2011-07-22 DIAGNOSIS — Z0000 Encounter for general adult medical examination without abnormal findings: Secondary | ICD-10-CM

## 2011-07-22 DIAGNOSIS — E663 Overweight: Secondary | ICD-10-CM

## 2011-07-22 DIAGNOSIS — F411 Generalized anxiety disorder: Secondary | ICD-10-CM

## 2011-07-22 DIAGNOSIS — R7301 Impaired fasting glucose: Secondary | ICD-10-CM

## 2011-07-22 DIAGNOSIS — F329 Major depressive disorder, single episode, unspecified: Secondary | ICD-10-CM

## 2011-07-22 NOTE — Patient Instructions (Addendum)
F/u in 6 months  Fasting lipid , and hepatic and HBA1C  in 6 months before f/u  Please cut back on fried and fatty foods, your bad cholesterol is too high.  It is important that you exercise regularly at least 30 minutes 5 times a week. If you develop chest pain, have severe difficulty breathing, or feel very tired, stop exercising immediately and seek medical attention  A healthy diet is rich in fruit, vegetables and whole grains. Poultry fish, nuts and beans are a healthy choice for protein rather then red meat. A low sodium diet and drinking 64 ounces of water daily is generally recommended. Oils and sweet should be limited. Carbohydrates especially for those who are diabetic or overweight, should be limited to 30-45 gram per meal. It is important to eat on a regular schedule, at least 3 times daily. Snacks should be primarily fruits, vegetables or nuts.  STOP weekly vit D please.  Start once daily calcium 1200mg  with vit D 1000IU  Gel capsule which is over the counter , for your bone health  Dexa needed next year.  Colonscopy needed next year since your father had colon ca   pls reconsider your decision refusing the pneumonia , flu and TDAP, these are meant to help to protect you.

## 2011-07-27 NOTE — Assessment & Plan Note (Signed)
LDL elevated, low fat diet discussed and encouraged. No med change at this time

## 2011-07-27 NOTE — Assessment & Plan Note (Signed)
Stable

## 2011-07-27 NOTE — Progress Notes (Signed)
  Subjective:    Patient ID: Tara [name], female    DOB: [birth year], 65 y.o.   MRN: [id number]  HPI The PT is here for annual exam and re-evaluation of chronic medical conditions, medication management and review of any available recent lab and radiology data.  Preventive health is updated, specifically  Cancer screening and Immunization.  Pt is refusing recommended immunization, states she will check with her insurance on coverage Questions or concerns regarding consultations or procedures which the PT has had in the interim are  addressed. The PT denies any adverse reactions to current medications since the last visit.  There are no new concerns.  There are no specific complaints . She recently continues to follow with oncology      Review of Systems See HPI Denies recent fever or chills. Denies sinus pressure, nasal congestion, ear pain or sore throat. Denies chest congestion, productive cough or wheezing. Denies chest pains, palpitations and leg swelling Denies abdominal pain, nausea, vomiting,diarrhea or constipation.   Denies dysuria, frequency, hesitancy or incontinence. Denies joint pain, swelling and limitation in mobility. Denies headaches, seizures, numbness, or tingling. Denies uncontrolled  depression, anxiety or insomnia.She follows with psych Denies skin break down or rash.        Objective:   Physical Exam Pleasant well nourished female, alert and oriented x 3, in no cardio-pulmonary distress. Afebrile. HEENT No facial trauma or asymetry. Sinuses non tender.  EOMI, PERTL, fundoscopic exam is normal, no hemorhage or exudate.  External ears normal, tympanic membranes clear. Oropharynx moist, no exudate, poor dentition. Neck: supple, no adenopathy,JVD or thyromegaly.No bruits.  Chest: Clear to ascultation bilaterally.No crackles or wheezes. Non tender to palpation  Breast: No asymetry,no masses. No nipple discharge or inversion. No axillary or  supraclavicular adenopathy  Cardiovascular system; Heart sounds normal,  S1 and  S2 ,no S3.  No murmur, or thrill. Apical beat not displaced Peripheral pulses normal.  Abdomen: Soft, non tender, no organomegaly or masses. No bruits. Bowel sounds normal. No guarding, tenderness or rebound.  Rectal:  No mass. Guaiac negative stool.  GU: Deferred  Musculoskeletal exam: Full ROM of spine, hips , shoulders and knees. No deformity ,swelling or crepitus noted. No muscle wasting or atrophy.   Neurologic: Cranial nerves 2 to 12 intact. Power, tone ,sensation and reflexes normal throughout. No disturbance in gait. No tremor.  Skin: Intact, no ulceration, erythema , scaling or rash noted. Pigmentation normal throughout  Psych; Normal mood and affect. Judgement and concentration normal        Assessment & Plan:

## 2011-07-27 NOTE — Assessment & Plan Note (Signed)
Deteriorated. Patient re-educated about  the importance of commitment to a  minimum of 150 minutes of exercise per week. The importance of healthy food choices with portion control discussed. Encouraged to start a food diary, count calories and to consider  joining a support group. Sample diet sheets offered. Goals set by the patient for the next several months.    

## 2011-07-27 NOTE — Assessment & Plan Note (Signed)
Controlled on current med, pt to continue to follow with psych

## 2011-08-28 ENCOUNTER — Telehealth: Payer: Self-pay | Admitting: Oncology

## 2011-08-28 NOTE — Telephone Encounter (Signed)
per pof 06/26 called pts home lmovm for appts in jan2013.  asked pt to rtn call to confirm

## 2011-09-09 ENCOUNTER — Other Ambulatory Visit: Payer: Self-pay | Admitting: Family Medicine

## 2011-09-12 ENCOUNTER — Other Ambulatory Visit: Payer: Self-pay | Admitting: Family Medicine

## 2011-09-24 ENCOUNTER — Other Ambulatory Visit: Payer: Medicare Other | Admitting: Lab

## 2011-09-26 ENCOUNTER — Other Ambulatory Visit: Payer: Medicare Other

## 2011-09-30 ENCOUNTER — Other Ambulatory Visit: Payer: Medicare Other | Admitting: Lab

## 2011-09-30 ENCOUNTER — Other Ambulatory Visit: Payer: Self-pay | Admitting: Oncology

## 2011-09-30 ENCOUNTER — Ambulatory Visit (HOSPITAL_BASED_OUTPATIENT_CLINIC_OR_DEPARTMENT_OTHER): Payer: Medicare Other | Admitting: Oncology

## 2011-09-30 ENCOUNTER — Telehealth: Payer: Self-pay | Admitting: Oncology

## 2011-09-30 ENCOUNTER — Other Ambulatory Visit: Payer: Self-pay | Admitting: Family Medicine

## 2011-09-30 VITALS — BP 110/72 | HR 103 | Temp 98.4°F | Ht 59.2 in | Wt 149.0 lb

## 2011-09-30 DIAGNOSIS — C50919 Malignant neoplasm of unspecified site of unspecified female breast: Secondary | ICD-10-CM

## 2011-09-30 DIAGNOSIS — E559 Vitamin D deficiency, unspecified: Secondary | ICD-10-CM

## 2011-09-30 LAB — CBC WITH DIFFERENTIAL/PLATELET
Basophils Absolute: 0 10*3/uL (ref 0.0–0.1)
Eosinophils Absolute: 0.1 10*3/uL (ref 0.0–0.5)
HGB: 11.3 g/dL — ABNORMAL LOW (ref 11.6–15.9)
MONO#: 0.4 10*3/uL (ref 0.1–0.9)
NEUT#: 6.1 10*3/uL (ref 1.5–6.5)
RDW: 15.1 % — ABNORMAL HIGH (ref 11.2–14.5)
lymph#: 1.1 10*3/uL (ref 0.9–3.3)

## 2011-09-30 LAB — VITAMIN D 25 HYDROXY (VIT D DEFICIENCY, FRACTURES): Vit D, 25-Hydroxy: 48 ng/mL (ref 30–89)

## 2011-09-30 LAB — COMPREHENSIVE METABOLIC PANEL
Alkaline Phosphatase: 84 U/L (ref 39–117)
CO2: 27 mEq/L (ref 19–32)
Creatinine, Ser: 0.88 mg/dL (ref 0.50–1.10)
Glucose, Bld: 82 mg/dL (ref 70–99)
Total Bilirubin: 0.3 mg/dL (ref 0.3–1.2)

## 2011-09-30 LAB — CANCER ANTIGEN 27.29: CA 27.29: 21 U/mL (ref 0–39)

## 2011-09-30 NOTE — Progress Notes (Signed)
Hematology and Oncology Follow Up Visit  Tara [name] [id number] 1946/08/13 66 y.o. 09/30/2011 2:15 PM PCP dr m simpson;   Principle Diagnosis: Stage II  breast cancer, ER/PR negative status post lumpectomy 02/03/2007, status post FEC and Taxotere every 3 weeks completing chemotherapy 07/2007, status post radiation therapy completed 11/15/2007 on regular followup.   Interim History:  There have been no intercurrent illness, hospitalizations or medication changes.  Medications: I have reviewed the patient's current medications.  Allergies: No Known Allergies  Past Medical History, Surgical history, Social history, and Family History were reviewed and updated.  Review of Systems: Constitutional:  Negative for fever, chills, night sweats, anorexia, weight loss, pain. Cardiovascular: no chest pain or dyspnea on exertion Respiratory: negative Neurological: negative Dermatological: negative ENT: negative Skin Gastrointestinal: negative Genito-Urinary: negative Hematological and Lymphatic: negative Breast: negative Musculoskeletal: negative Remaining ROS negative.  Physical Exam: Blood pressure 110/72, pulse 103, temperature 98.4 F (36.9 C), height 4' 11.2" (1.504 m), weight 149 lb (67.586 kg). ECOG: 0 General appearance: alert, cooperative and appears stated age Head: Normocephalic, without obvious abnormality, atraumatic Neck: no adenopathy, no carotid bruit, no JVD, supple, symmetrical, trachea midline and thyroid not enlarged, symmetric, no tenderness/mass/nodules Lymph nodes: Cervical, supraclavicular, and axillary nodes normal. Cardiac : regular rate and rhythm, no murmurs or gallops Pulmonary:clear to auscultation bilaterally and normal percussion bilaterally Breasts: inspection negative, no nipple discharge or bleeding, no masses or nodularity palpable Abdomen:soft, non-tender; bowel sounds normal; no masses,  no organomegaly Extremities negative Neuro: alert, oriented,  normal speech, no focal findings or movement disorder noted  Lab Results: Lab Results  Component Value Date   WBC 7.8 09/30/2011   HGB 11.3* 09/30/2011   HCT 34.5* 09/30/2011   MCV 85.1 09/30/2011   PLT 195 09/30/2011     Chemistry      Component Value Date/Time   NA 143 03/18/2011 1109   NA 143 03/18/2011 1109   NA 143 03/18/2011 1109   K 4.0 03/18/2011 1109   K 4.0 03/18/2011 1109   K 4.0 03/18/2011 1109   CL 105 03/18/2011 1109   CL 105 03/18/2011 1109   CL 105 03/18/2011 1109   CO2 27 03/18/2011 1109   CO2 27 03/18/2011 1109   CO2 27 03/18/2011 1109   BUN 18 03/18/2011 1109   BUN 18 03/18/2011 1109   BUN 18 03/18/2011 1109   CREATININE 0.92 03/18/2011 1109   CREATININE 0.92 03/18/2011 1109   CREATININE 0.92 03/18/2011 1109      Component Value Date/Time   CALCIUM 8.9 03/18/2011 1109   CALCIUM 8.9 03/18/2011 1109   CALCIUM 8.9 03/18/2011 1109   ALKPHOS 99 03/18/2011 1114   AST 16 03/18/2011 1114   ALT 11 03/18/2011 1114   BILITOT 0.3 03/18/2011 1114      .pathology. Radiological Studies: chest X-ray n/a Mammogram Due 5/13 Bone density   Impression and Plan: [name] is doing well, she will be seen in 1 yr.  More than 50% of the visit was spent in patient-related counselling   Pierce Crane, MD 1/8/20132:15 PM

## 2011-09-30 NOTE — Telephone Encounter (Signed)
Gv pt appt for jan2014.  scheduled mamogram for [phone] @ [facility]

## 2011-10-01 LAB — LIPID PANEL
Cholesterol: 168 mg/dL (ref 0–200)
HDL: 62 mg/dL (ref >39)
Triglycerides: 57 mg/dL (ref <150)

## 2011-10-01 LAB — HEMOGLOBIN A1C: Hgb A1c MFr Bld: 5.7 % — ABNORMAL HIGH (ref <5.7)

## 2011-10-03 ENCOUNTER — Ambulatory Visit: Payer: Medicare Other | Admitting: Oncology

## 2011-12-18 ENCOUNTER — Encounter: Payer: Self-pay | Admitting: Gastroenterology

## 2012-01-21 ENCOUNTER — Ambulatory Visit (INDEPENDENT_AMBULATORY_CARE_PROVIDER_SITE_OTHER): Payer: Medicare Other | Admitting: Family Medicine

## 2012-01-21 ENCOUNTER — Encounter: Payer: Self-pay | Admitting: Family Medicine

## 2012-01-21 VITALS — BP 112/80 | HR 82 | Resp 16 | Ht 59.5 in | Wt 149.8 lb

## 2012-01-21 DIAGNOSIS — C50919 Malignant neoplasm of unspecified site of unspecified female breast: Secondary | ICD-10-CM

## 2012-01-21 DIAGNOSIS — E785 Hyperlipidemia, unspecified: Secondary | ICD-10-CM

## 2012-01-21 DIAGNOSIS — R5381 Other malaise: Secondary | ICD-10-CM

## 2012-01-21 DIAGNOSIS — F411 Generalized anxiety disorder: Secondary | ICD-10-CM

## 2012-01-21 DIAGNOSIS — E663 Overweight: Secondary | ICD-10-CM

## 2012-01-21 DIAGNOSIS — K219 Gastro-esophageal reflux disease without esophagitis: Secondary | ICD-10-CM

## 2012-01-21 DIAGNOSIS — F329 Major depressive disorder, single episode, unspecified: Secondary | ICD-10-CM

## 2012-01-21 DIAGNOSIS — R7301 Impaired fasting glucose: Secondary | ICD-10-CM

## 2012-01-21 NOTE — Progress Notes (Signed)
  Subjective:    Patient ID: Tara [name], female    DOB: 01-01-1946, 66 y.o.   MRN: [id number]  HPI The PT is here for follow up and re-evaluation of chronic medical conditions, medication management and review of any available recent lab and radiology data.  Preventive health is updated, specifically  Cancer screening and Immunization.   Questions or concerns regarding consultations or procedures which the PT has had in the interim are  addressed. The PT denies any adverse reactions to current medications since the last visit.  There are no new concerns.  There are no specific complaints       Review of Systems See HPI Denies recent fever or chills. Denies sinus pressure, nasal congestion, ear pain or sore throat. Denies chest congestion, productive cough or wheezing. Denies chest pains, palpitations and leg swelling Denies abdominal pain, nausea, vomiting,diarrhea or constipation.   Denies dysuria, frequency, hesitancy or incontinence. Denies joint pain, swelling and limitation in mobility. Denies headaches, seizures, numbness, or tingling. Denies depression, anxiety or insomnia. Denies skin break down or rash.        Objective:   Physical Exam  Patient alert and oriented and in no cardiopulmonary distress.  HEENT: No facial asymmetry, EOMI, no sinus tenderness,  oropharynx pink and moist.  Neck supple no adenopathy.  Chest: Clear to auscultation bilaterally.  CVS: S1, S2 no murmurs, no S3.  ABD: Soft non tender. Bowel sounds normal.  Ext: No edema  MS: Adequate ROM spine, shoulders, hips and knees.  Skin: Intact, no ulcerations or rash noted.  Psych: Good eye contact, normal affect. Memory intact not anxious or depressed appearing.  CNS: CN 2-12 intact, power, tone and sensation normal throughout.       Assessment & Plan:

## 2012-01-21 NOTE — Patient Instructions (Addendum)
F/u in November with rectal exam  Fasting lipid, cmp and blood glucose .  No change in medication art this time  It is important that you exercise regularly at least 30 minutes 5 times a week. If you develop chest pain, have severe difficulty breathing, or feel very tired, stop exercising immediately and seek medical attention   A healthy diet is rich in fruit, vegetables and whole grains. Poultry fish, nuts and beans are a healthy choice for protein rather then red meat. A low sodium diet and drinking 64 ounces of water daily is generally recommended. Oils and sweet should be limited. Snacks should be primarily fruits, vegetables or nuts.

## 2012-01-25 NOTE — Assessment & Plan Note (Signed)
Low fat diet discussed and encouraged, no change in med, currently controlled

## 2012-01-25 NOTE — Assessment & Plan Note (Signed)
followed regularly by oncology

## 2012-01-25 NOTE — Assessment & Plan Note (Signed)
Controlled, no change in medication  

## 2012-01-25 NOTE — Assessment & Plan Note (Signed)
Well controlled, managed by psychiatry

## 2012-01-25 NOTE — Assessment & Plan Note (Signed)
Improved. Pt applauded on succesful weight loss through lifestyle change, and encouraged to continue same. Weight loss goal set for the next several months.  

## 2012-01-28 ENCOUNTER — Ambulatory Visit
Admission: RE | Admit: 2012-01-28 | Discharge: 2012-01-28 | Disposition: A | Discharge status: Home / Self Care | Payer: Medicare Other | Source: Ambulatory Visit | Attending: Oncology | Admitting: Oncology

## 2012-01-28 DIAGNOSIS — E559 Vitamin D deficiency, unspecified: Secondary | ICD-10-CM

## 2012-01-28 DIAGNOSIS — C50919 Malignant neoplasm of unspecified site of unspecified female breast: Secondary | ICD-10-CM

## 2012-02-10 ENCOUNTER — Telehealth: Payer: Self-pay | Admitting: Family Medicine

## 2012-02-11 NOTE — Telephone Encounter (Signed)
Pt would like to know if protonix or prilosec may be better for her GERD.  She is currently on prevacid. Please advise.

## 2012-07-19 ENCOUNTER — Other Ambulatory Visit: Payer: Self-pay

## 2012-07-19 MED ORDER — METOCLOPRAMIDE HCL 10 MG PO TABS: ORAL_TABLET | ORAL | Status: DC

## 2012-07-19 MED ORDER — LANSOPRAZOLE 30 MG PO CPDR: DELAYED_RELEASE_CAPSULE | ORAL | Status: DC

## 2012-07-19 MED ORDER — LOVASTATIN 40 MG PO TABS: ORAL_TABLET | ORAL | Status: DC

## 2012-07-26 ENCOUNTER — Other Ambulatory Visit: Payer: Self-pay | Admitting: Family Medicine

## 2012-07-26 ENCOUNTER — Ambulatory Visit (INDEPENDENT_AMBULATORY_CARE_PROVIDER_SITE_OTHER): Payer: Medicare Other | Admitting: Family Medicine

## 2012-07-26 ENCOUNTER — Encounter: Payer: Self-pay | Admitting: Family Medicine

## 2012-07-26 VITALS — BP 110/70 | HR 82 | Resp 15 | Ht 59.5 in | Wt 152.8 lb

## 2012-07-26 DIAGNOSIS — H6123 Impacted cerumen, bilateral: Secondary | ICD-10-CM

## 2012-07-26 DIAGNOSIS — E663 Overweight: Secondary | ICD-10-CM

## 2012-07-26 DIAGNOSIS — K219 Gastro-esophageal reflux disease without esophagitis: Secondary | ICD-10-CM

## 2012-07-26 DIAGNOSIS — E785 Hyperlipidemia, unspecified: Secondary | ICD-10-CM

## 2012-07-26 DIAGNOSIS — F411 Generalized anxiety disorder: Secondary | ICD-10-CM

## 2012-07-26 DIAGNOSIS — Z1211 Encounter for screening for malignant neoplasm of colon: Secondary | ICD-10-CM

## 2012-07-26 DIAGNOSIS — H6120 Impacted cerumen, unspecified ear: Secondary | ICD-10-CM

## 2012-07-26 DIAGNOSIS — F3289 Other specified depressive episodes: Secondary | ICD-10-CM

## 2012-07-26 DIAGNOSIS — Z0000 Encounter for general adult medical examination without abnormal findings: Secondary | ICD-10-CM

## 2012-07-26 DIAGNOSIS — F329 Major depressive disorder, single episode, unspecified: Secondary | ICD-10-CM

## 2012-07-26 NOTE — Progress Notes (Signed)
Both ears were successfully irrigated with no complications

## 2012-07-26 NOTE — Assessment & Plan Note (Signed)
Pt to attempt to reduce reglan to twice daily if tolerated

## 2012-07-26 NOTE — Patient Instructions (Addendum)
F/U in 6 month, call if you need me before  It is important that you exercise regularly at least 30 minutes 5 times a week. If you develop chest pain, have severe difficulty breathing, or feel very tired, stop exercising immediately and seek medical attention  A healthy diet is rich in fruit, vegetables and whole grains. Poultry fish, nuts and beans are a healthy choice for protein rather then red meat. A low sodium diet and drinking 64 ounces of water daily is generally recommended. Oils and sweet should be limited. Carbohydrates especially for those who are diabetic or overweight, should be limited to 34-45 gram per meal. It is important to eat on a regular schedule, at least 3 times daily. Snacks should be primarily fruits, vegetables or nuts.    You need the flu, pneumonia and shingles vaccines, also the flu vaccine\  Ear irrigation today

## 2012-07-26 NOTE — Assessment & Plan Note (Signed)
Controlled, no change in medication  

## 2012-07-26 NOTE — Assessment & Plan Note (Signed)
Hyperlipidemia:Low fat diet discussed and encouraged.  No med change at this time     

## 2012-07-26 NOTE — Assessment & Plan Note (Signed)
General exam including breast and rectal done at visit and documented. The need to commit to dily physical activity and dietary change to promote weight loss is stressed Immunization which is due is offered and refused

## 2012-07-26 NOTE — Assessment & Plan Note (Signed)
Deteriorated. Patient re-educated about  the importance of commitment to a  minimum of 150 minutes of exercise per week. The importance of healthy food choices with portion control discussed. Encouraged to start a food diary, count calories and to consider  joining a support group. Sample diet sheets offered. Goals set by the patient for the next several months.    

## 2012-07-26 NOTE — Assessment & Plan Note (Signed)
Bilateral ear irrigation completed with no trauma to tM or ear canal

## 2012-07-26 NOTE — Progress Notes (Signed)
  Subjective:    Patient ID: Tara [name], female    DOB: 04-21-1946, 66 y.o.   MRN: [id number]  HPI The PT is here for follow up and re-evaluation of chronic medical conditions, medication management and review of any available recent lab and radiology data.  Preventive health is updated, specifically  Cancer screening and Immunization.  Refusing recommended immunization Questions or concerns regarding consultations or procedures which the PT has had in the interim are  addressed. The PT denies any adverse reactions to current medications since the last visit.  There are no new concerns. Not comited to regular exercise There are no specific complaints       Review of Systems See HPI Denies recent fever or chills. Denies sinus pressure, nasal congestion, ear pain or sore throat. Denies chest congestion, productive cough or wheezing. Denies chest pains, palpitations and leg swelling Denies abdominal pain, nausea, vomiting,diarrhea or constipation.   Denies dysuria, frequency, hesitancy or incontinence. Denies joint pain, swelling and limitation in mobility. Denies headaches, seizures, numbness, or tingling. Denies depression, anxiety or insomnia. Denies skin break down or rash.        Objective:   Physical Exam  Pleasant well nourished female, alert and oriented x 3, in no cardio-pulmonary distress. Afebrile. HEENT No facial trauma or asymetry. Sinuses non tender.  EOMI, PERTL, fundoscopic exam is normal, no hemorhage or exudate.  External ears normal, tympanic membranes occluded bilaterally, clear after successful bilateral ear irrigation. No trauma to cnalOropharynx moist, no exudate, fair  dentition. Neck: supple, no adenopathy,JVD or thyromegaly.No bruits.  Chest: Clear to ascultation bilaterally.No crackles or wheezes. Non tender to palpation  Breast: No no masses. No nipple discharge or inversion. No axillary or supraclavicular adenopathy  Cardiovascular  system; Heart sounds normal,  S1 and  S2 ,no S3.  No murmur, or thrill. Apical beat not displaced Peripheral pulses normal.  Abdomen: Soft, non tender, no organomegaly or masses. No bruits. Bowel sounds normal. No guarding, tenderness or rebound.  Rectal:  No mass. Guaiac negative stool.  Musculoskeletal exam: Full ROM of spine, hips , shoulders and knees. No deformity ,swelling or crepitus noted. No muscle wasting or atrophy.   Neurologic: Cranial nerves 2 to 12 intact. Power, tone ,sensation and reflexes normal throughout. No disturbance in gait. No tremor.  Skin: Intact, no ulceration, erythema , scaling or rash noted. Pigmentation normal throughout  Psych; Normal mood and affect.        Assessment & Plan:

## 2012-07-27 LAB — LIPID PANEL
Cholesterol: 173 mg/dL (ref 0–200)
HDL: 59 mg/dL (ref >39)
Total CHOL/HDL Ratio: 2.9 Ratio
Triglycerides: 52 mg/dL (ref <150)

## 2012-07-27 LAB — COMPLETE METABOLIC PANEL WITH GFR
AST: 13 U/L (ref 0–37)
Alkaline Phosphatase: 92 U/L (ref 39–117)
BUN: 13 mg/dL (ref 6–23)
GFR, Est Non African American: 69 mL/min
Glucose, Bld: 95 mg/dL (ref 70–99)
Total Bilirubin: 0.3 mg/dL (ref 0.3–1.2)

## 2012-09-14 ENCOUNTER — Telehealth: Payer: Self-pay | Admitting: *Deleted

## 2012-09-14 NOTE — Telephone Encounter (Signed)
left voice message to inform the patient of the md will be changing in 2014 

## 2012-09-29 ENCOUNTER — Telehealth: Payer: Self-pay | Admitting: *Deleted

## 2012-09-29 ENCOUNTER — Other Ambulatory Visit: Payer: Self-pay

## 2012-09-29 MED ORDER — METOCLOPRAMIDE HCL 10 MG PO TABS: ORAL_TABLET | ORAL | Status: DC

## 2012-09-29 NOTE — Telephone Encounter (Signed)
Pt called in question of when she was going to be rescheduled to see another oncologist.  Informed her that we are working very hard on it and would be back in contact with her as soon as possible.

## 2012-09-30 ENCOUNTER — Ambulatory Visit: Payer: Medicare Other | Admitting: Oncology

## 2012-09-30 ENCOUNTER — Other Ambulatory Visit: Payer: Medicare Other | Admitting: Lab

## 2012-10-05 ENCOUNTER — Telehealth: Payer: Self-pay | Admitting: Oncology

## 2012-10-05 NOTE — Telephone Encounter (Signed)
Scheduled follow up on Thursday with Marlowe Kays.  Pt agreed to come in.

## 2012-10-07 ENCOUNTER — Telehealth: Payer: Self-pay | Admitting: *Deleted

## 2012-10-07 ENCOUNTER — Encounter: Payer: Self-pay | Admitting: Physician Assistant

## 2012-10-07 ENCOUNTER — Ambulatory Visit (HOSPITAL_BASED_OUTPATIENT_CLINIC_OR_DEPARTMENT_OTHER): Payer: Medicare Other | Admitting: Physician Assistant

## 2012-10-07 VITALS — BP 122/70 | HR 82 | Temp 98.2°F | Resp 20 | Ht 59.0 in | Wt 155.7 lb

## 2012-10-07 DIAGNOSIS — Z853 Personal history of malignant neoplasm of breast: Secondary | ICD-10-CM

## 2012-10-07 DIAGNOSIS — Z171 Estrogen receptor negative status [ER-]: Secondary | ICD-10-CM

## 2012-10-07 DIAGNOSIS — C50919 Malignant neoplasm of unspecified site of unspecified female breast: Secondary | ICD-10-CM

## 2012-10-07 NOTE — Telephone Encounter (Signed)
Gave patient appointment for one year 

## 2012-10-07 NOTE — Progress Notes (Signed)
ID: [name]   DOB: 10-Feb-1946  MR#: [id number]  WUJ#:[id number]  PCP: Tara Overman, MD    Principle Diagnosis: Stage II breast cancer, ER/PR negative status post lumpectomy 02/03/2007, status post FEC and Taxotere every 3 weeks completing chemotherapy 07/2007, status post radiation therapy completed 11/15/2007 on regular followup.    INTERVAL HISTORY:Tara [name] 67 y.o. female returns for a follow up appointment.  She was last seen at our office on 09/30/2011, at which time she was clinically stable. Her last general physical exam by Dr. Lodema [name] in [place] was unremarkable for new findings or complaints. Last mammogram was performed on 01/28/2012, negative for malignant findings or R breast local recurrence. She denies  bone pain, myalgia,hot flashes, vaginal discharge,or fatigue   Activity level is appropriate for her age.   REVIEW OF SYSTEMS:  PAST MEDICAL HISTORY: Past Medical History  Diagnosis Date  . H/O: hysterectomy   . Breast cancer     right   . History of cancer chemotherapy   . History of radiation therapy   . Depression   . GERD (gastroesophageal reflux disease)     PAST SURGICAL HISTORY: Past Surgical History  Procedure Date  . Abdominal hysterectomy 1994  . Total abdominal hysterectomy w/ bilateral salpingoophorectomy 1994  . Cholecystectomy 1999  . Lumpectomy right breast 2008    FAMILY HISTORY Family History  Problem Relation Age of Onset  . Colon cancer Father   . Cancer Father     colon   . Hypertension Sister      HEALTH MAINTENANCE: History  Substance Use Topics  . Smoking status: Never Smoker   . Smokeless tobacco: Not on file  . Alcohol Use: No      No Known Allergies  Current Outpatient Prescriptions  Medication Sig Dispense Refill  . clonazePAM (KLONOPIN) 1 MG tablet       . lansoprazole (PREVACID) 30 MG capsule TAKE 1 CAPSULE BY MOUTH ONCE DAILY FOR ACID REFLUX.  90 capsule  1  . lovastatin (MEVACOR) 40 MG tablet TAKE (1)  TABLET BY MOUTH AT BEDTIME FOR CHOLESTEROL.  90 tablet  1  . methylphenidate (METADATE ER) 20 MG ER tablet Take 20 mg by mouth every morning.        . methylphenidate (RITALIN) 5 MG tablet Take 5 mg by mouth 2 (two) times daily.        . metoCLOPramide (REGLAN) 10 MG tablet TAKE 1 TABLET BY MOUTH BEFORE MEALS AND AT BEDTIME.  360 tablet  1  . sertraline (ZOLOFT) 50 MG tablet Take 50 mg by mouth daily.        Marland Kitchen zolpidem (AMBIEN) 10 MG tablet Take 10 mg by mouth at bedtime as needed.          OBJECTIVE: Filed Vitals:   10/07/12 0955  BP: 122/70  Pulse: 82  Temp: 98.2 F (36.8 C)  Resp: 20     Body mass index is 31.45 kg/(m^2).    ECOG FS:   GENERAL: Well developed, well nourished, in no acute distress.  EENT: No ocular or oral lesions. No stomatitis.  RESPIRATORY: Lungs are clear to auscultation bilaterally, no wheezing, rhonchi or rales. CARDIAC: regular rate and rhythm, no murmurs, rubs or gallops. GI: Abdomen is soft, non tender,  no palpable hepatosplenomegaly. No fluid wave. Musculoskeletal:No focal spinal tenderness, no peripheral edema. Lymph: No cervical, infraclavicular, axillary or inguinal adenopathy. Neuro: No focal neurological deficits. Psych: Alert and oriented X 3, appropriate mood and affect.  BREAST  EXAM:inspection negative, no nipple discharge or bleeding, no masses or nodularity palpable.    LAB RESULTS: Lab Results  Component Value Date   WBC 7.8 09/30/2011   NEUTROABS 6.1 09/30/2011   HGB 11.3* 09/30/2011   HCT 34.5* 09/30/2011   MCV 85.1 09/30/2011   PLT 195 09/30/2011      Chemistry      Component Value Date/Time   NA 145 07/26/2012 0825   K 3.9 07/26/2012 0825   CL 108 07/26/2012 0825   CO2 29 07/26/2012 0825   BUN 13 07/26/2012 0825   CREATININE 0.88 07/26/2012 0825   CREATININE 0.88 09/30/2011 1315   CREATININE 0.88 09/30/2011 1315   CREATININE 0.88 09/30/2011 1315      Component Value Date/Time   CALCIUM 9.2 07/26/2012 0825   ALKPHOS 92 07/26/2012 0825   AST  13 07/26/2012 0825   ALT <8 07/26/2012 0825   BILITOT 0.3 07/26/2012 0825       Lab Results  Component Value Date   LABCA2 21 09/30/2011   LABCA2 21 09/30/2011     STUDIES: No results found.    Assessment/PlanBarbara R [name] 67 y.o. female with  A history of Stage II breast cancer, ER/PR negative status post lumpectomy 02/03/2007, status post FEC and Taxotere every 3 weeks completing chemotherapy 07/2007, status post radiation therapy completed 11/15/2007 on regular followup.  Patient has been introduced to Dr. Welton [name] as her new Oncologist. Dr. Welton [name] is to see her in 1 year,with mammogram results. Labs are to be performed by her primary physician.  The length of time of the face-to-face encounter was 30  minutes. More than 50% of time was spent counseling and coordination of care.   Tara [name]    10/07/2012

## 2012-12-28 ENCOUNTER — Other Ambulatory Visit: Payer: Self-pay | Admitting: Oncology

## 2012-12-28 DIAGNOSIS — Z9889 Other specified postprocedural states: Secondary | ICD-10-CM

## 2012-12-28 DIAGNOSIS — Z853 Personal history of malignant neoplasm of breast: Secondary | ICD-10-CM

## 2013-01-25 ENCOUNTER — Encounter: Payer: Self-pay | Admitting: Family Medicine

## 2013-01-25 ENCOUNTER — Ambulatory Visit (INDEPENDENT_AMBULATORY_CARE_PROVIDER_SITE_OTHER): Payer: Medicare Other | Admitting: Family Medicine

## 2013-01-25 VITALS — BP 114/62 | HR 92 | Resp 18 | Ht 59.5 in | Wt 159.0 lb

## 2013-01-25 DIAGNOSIS — K219 Gastro-esophageal reflux disease without esophagitis: Secondary | ICD-10-CM

## 2013-01-25 DIAGNOSIS — E663 Overweight: Secondary | ICD-10-CM

## 2013-01-25 DIAGNOSIS — E785 Hyperlipidemia, unspecified: Secondary | ICD-10-CM

## 2013-01-25 MED ORDER — LOVASTATIN 40 MG PO TABS: ORAL_TABLET | ORAL | Status: DC

## 2013-01-25 MED ORDER — LANSOPRAZOLE 30 MG PO CPDR: DELAYED_RELEASE_CAPSULE | ORAL | Status: DC

## 2013-01-25 MED ORDER — METOCLOPRAMIDE HCL 10 MG PO TABS: ORAL_TABLET | ORAL | Status: DC

## 2013-01-25 NOTE — Progress Notes (Signed)
  Subjective:    Patient ID: Tara [name], female    DOB: 12/16/1945, 67 y.o.   MRN: [id number]  HPI  Pt here to followup chronic medical problems. She's seen by psychiatrist on a regular basis for her depression anxiety she's not had any recent medication changes. I reviewed her last set of labs she's doing well on Mevacor has no concerns. She's not very active she stopped exercising a few weeks ago. She does not cook and typically eat out but states that she eats growth foods and vegetables from these restaurants. She continues to use Reglan 4 times a day. She states without it that she has vomiting after she eats and pain. She tried stopping it cold [place] and her symptoms returned  Review of Systems   GEN- denies fatigue, fever, weight loss,weakness, recent illness HEENT- denies eye drainage, change in vision, nasal discharge, CVS- denies chest pain, palpitations RESP- denies SOB, cough, wheeze ABD- denies N/V, change in stools, abd pain GU- denies dysuria, hematuria, dribbling, incontinence MSK- denies joint pain, muscle aches, injury Neuro- denies headache, dizziness, syncope, seizure activity      Objective:   Physical Exam  GEN- NAD, alert and oriented x3 HEENT- PERRL, EOMI, non injected sclera, pink conjunctiva, MMM, oropharynx clear Neck- Supple,  CVS- RRR, no murmur RESP-CTAB ABD-NABS,soft,NT,ND EXT- No edema Pulses- Radial, DP- 2+        Assessment & Plan:

## 2013-01-25 NOTE — Assessment & Plan Note (Signed)
Discussed reglan use, will try to back down to 5mg  with each dose and see how she does

## 2013-01-25 NOTE — Assessment & Plan Note (Signed)
reviewed FLP- at goal, continue mevacor

## 2013-01-25 NOTE — Assessment & Plan Note (Signed)
Discussed importance of weight loss, regular aerobic exercise, short term goals set

## 2013-01-25 NOTE — Patient Instructions (Signed)
Continue current medications Try to cut your reglan in half, take 5mg  with each meals  F/U Dr. Lodema Hong 6 months

## 2013-02-01 ENCOUNTER — Ambulatory Visit
Admission: RE | Admit: 2013-02-01 | Discharge: 2013-02-01 | Disposition: A | Discharge status: Home / Self Care | Payer: Medicare Other | Source: Ambulatory Visit | Attending: Oncology | Admitting: Oncology

## 2013-02-01 DIAGNOSIS — Z9889 Other specified postprocedural states: Secondary | ICD-10-CM

## 2013-02-01 DIAGNOSIS — Z853 Personal history of malignant neoplasm of breast: Secondary | ICD-10-CM

## 2013-02-07 ENCOUNTER — Emergency Department (HOSPITAL_COMMUNITY): Payer: Medicare Other

## 2013-02-07 ENCOUNTER — Encounter (HOSPITAL_COMMUNITY): Payer: Self-pay | Admitting: Emergency Medicine

## 2013-02-07 ENCOUNTER — Inpatient Hospital Stay (HOSPITAL_COMMUNITY)
Admission: EM | Admit: 2013-02-07 | Discharge: 2013-02-08 | DRG: 309 | Disposition: A | Discharge status: Home / Self Care | Payer: Medicare Other | Attending: Cardiovascular Disease | Admitting: Cardiovascular Disease

## 2013-02-07 DIAGNOSIS — Z923 Personal history of irradiation: Secondary | ICD-10-CM

## 2013-02-07 DIAGNOSIS — F329 Major depressive disorder, single episode, unspecified: Secondary | ICD-10-CM | POA: Diagnosis present

## 2013-02-07 DIAGNOSIS — K219 Gastro-esophageal reflux disease without esophagitis: Secondary | ICD-10-CM | POA: Diagnosis present

## 2013-02-07 DIAGNOSIS — I471 Supraventricular tachycardia, unspecified: Principal | ICD-10-CM

## 2013-02-07 DIAGNOSIS — N179 Acute kidney failure, unspecified: Secondary | ICD-10-CM | POA: Diagnosis present

## 2013-02-07 DIAGNOSIS — Z9221 Personal history of antineoplastic chemotherapy: Secondary | ICD-10-CM

## 2013-02-07 DIAGNOSIS — F3289 Other specified depressive episodes: Secondary | ICD-10-CM | POA: Diagnosis present

## 2013-02-07 DIAGNOSIS — E785 Hyperlipidemia, unspecified: Secondary | ICD-10-CM | POA: Diagnosis present

## 2013-02-07 DIAGNOSIS — Z853 Personal history of malignant neoplasm of breast: Secondary | ICD-10-CM

## 2013-02-07 DIAGNOSIS — Z90710 Acquired absence of both cervix and uterus: Secondary | ICD-10-CM

## 2013-02-07 DIAGNOSIS — Z79899 Other long term (current) drug therapy: Secondary | ICD-10-CM

## 2013-02-07 HISTORY — DX: Supraventricular tachycardia, unspecified: I47.10

## 2013-02-07 HISTORY — DX: Supraventricular tachycardia: I47.1

## 2013-02-07 HISTORY — DX: Hyperlipidemia, unspecified: E78.5

## 2013-02-07 HISTORY — DX: Diaphragmatic hernia without obstruction or gangrene: K44.9

## 2013-02-07 LAB — POCT I-STAT TROPONIN I: Troponin i, poc: 0.02 ng/mL (ref 0.00–0.08)

## 2013-02-07 LAB — PHOSPHORUS: Phosphorus: 4.9 mg/dL — ABNORMAL HIGH (ref 2.3–4.6)

## 2013-02-07 LAB — URINALYSIS, ROUTINE W REFLEX MICROSCOPIC
Bilirubin Urine: NEGATIVE
Glucose, UA: NEGATIVE mg/dL
Hgb urine dipstick: NEGATIVE
Ketones, ur: NEGATIVE mg/dL
Leukocytes, UA: NEGATIVE
pH: 5 (ref 5.0–8.0)

## 2013-02-07 LAB — CBC
MCH: 26.9 pg (ref 26.0–34.0)
MCV: 81.3 fL (ref 78.0–100.0)
Platelets: 260 10*3/uL (ref 150–400)
RDW: 14.7 % (ref 11.5–15.5)

## 2013-02-07 LAB — BASIC METABOLIC PANEL
Calcium: 9.6 mg/dL (ref 8.4–10.5)
Creatinine, Ser: 1.21 mg/dL — ABNORMAL HIGH (ref 0.50–1.10)
GFR calc Af Amer: 52 mL/min — ABNORMAL LOW (ref >90)
GFR calc non Af Amer: 45 mL/min — ABNORMAL LOW (ref >90)
Sodium: 141 mEq/L (ref 135–145)

## 2013-02-07 LAB — MAGNESIUM: Magnesium: 2 mg/dL (ref 1.5–2.5)

## 2013-02-07 MED ORDER — SODIUM CHLORIDE 0.9 % IV SOLN: 250.0000 mL | INTRAVENOUS | Status: DC | PRN

## 2013-02-07 MED ORDER — SIMVASTATIN 20 MG PO TABS
20.0000 mg | ORAL_TABLET | Freq: Every day | ORAL | Status: DC
  Filled 2013-02-07: qty 1

## 2013-02-07 MED ORDER — HEPARIN SODIUM (PORCINE) 5000 UNIT/ML IJ SOLN
5000.0000 [IU] | Freq: Three times a day (TID) | INTRAMUSCULAR | Status: DC
  Administered 2013-02-07 – 2013-02-08 (×2): 5000 [IU] via SUBCUTANEOUS
  Filled 2013-02-07 (×5): qty 1

## 2013-02-07 MED ORDER — METOCLOPRAMIDE HCL 10 MG PO TABS
10.0000 mg | ORAL_TABLET | Freq: Four times a day (QID) | ORAL | Status: DC
  Administered 2013-02-07 – 2013-02-08 (×2): 10 mg via ORAL
  Filled 2013-02-07 (×5): qty 1

## 2013-02-07 MED ORDER — METOPROLOL TARTRATE 25 MG PO TABS: 12.5000 mg | ORAL_TABLET | Freq: Two times a day (BID) | ORAL | Status: DC

## 2013-02-07 MED ORDER — METOPROLOL TARTRATE 25 MG PO TABS
12.5000 mg | ORAL_TABLET | Freq: Once | ORAL | Status: AC
  Administered 2013-02-07: 12.5 mg via ORAL
  Filled 2013-02-07: qty 1

## 2013-02-07 MED ORDER — ONDANSETRON HCL 4 MG/2ML IJ SOLN: 4.0000 mg | Freq: Four times a day (QID) | INTRAMUSCULAR | Status: DC | PRN

## 2013-02-07 MED ORDER — METOPROLOL TARTRATE 12.5 MG HALF TABLET
12.5000 mg | ORAL_TABLET | Freq: Two times a day (BID) | ORAL | Status: DC
  Administered 2013-02-07: 12.5 mg via ORAL
  Filled 2013-02-07 (×3): qty 1

## 2013-02-07 MED ORDER — ACETAMINOPHEN 325 MG PO TABS: 650.0000 mg | ORAL_TABLET | ORAL | Status: DC | PRN

## 2013-02-07 MED ORDER — SODIUM CHLORIDE 0.9 % IJ SOLN
3.0000 mL | Freq: Two times a day (BID) | INTRAMUSCULAR | Status: DC
  Administered 2013-02-07 – 2013-02-08 (×2): 3 mL via INTRAVENOUS

## 2013-02-07 MED ORDER — SERTRALINE HCL 50 MG PO TABS
50.0000 mg | ORAL_TABLET | Freq: Every day | ORAL | Status: DC
  Administered 2013-02-08: 50 mg via ORAL
  Filled 2013-02-07: qty 1

## 2013-02-07 MED ORDER — ADENOSINE 6 MG/2ML IV SOLN
INTRAVENOUS | Status: AC
  Administered 2013-02-07: 6 mg via INTRAVENOUS
  Filled 2013-02-07: qty 4

## 2013-02-07 MED ORDER — PANTOPRAZOLE SODIUM 40 MG PO TBEC
40.0000 mg | DELAYED_RELEASE_TABLET | Freq: Every day | ORAL | Status: DC
  Administered 2013-02-08: 40 mg via ORAL
  Filled 2013-02-07: qty 1

## 2013-02-07 MED ORDER — ADENOSINE 6 MG/2ML IV SOLN
12.0000 mg | Freq: Once | INTRAVENOUS | Status: AC
  Administered 2013-02-07: 12 mg via INTRAVENOUS

## 2013-02-07 MED ORDER — ADENOSINE 6 MG/2ML IV SOLN: 6.0000 mg | Freq: Once | INTRAVENOUS | Status: DC

## 2013-02-07 MED ORDER — ZOLPIDEM TARTRATE 5 MG PO TABS
5.0000 mg | ORAL_TABLET | Freq: Every evening | ORAL | Status: DC | PRN
  Administered 2013-02-07: 5 mg via ORAL
  Filled 2013-02-07: qty 1

## 2013-02-07 MED ORDER — SODIUM CHLORIDE 0.9 % IJ SOLN: 3.0000 mL | INTRAMUSCULAR | Status: DC | PRN

## 2013-02-07 MED ORDER — ADENOSINE 6 MG/2ML IV SOLN
INTRAVENOUS | Status: AC
  Filled 2013-02-07: qty 4

## 2013-02-07 MED ORDER — VITAMIN D3 25 MCG (1000 UNIT) PO TABS
1000.0000 [IU] | ORAL_TABLET | Freq: Every day | ORAL | Status: DC
  Administered 2013-02-08: 1000 [IU] via ORAL
  Filled 2013-02-07: qty 1

## 2013-02-07 MED ORDER — ADENOSINE 6 MG/2ML IV SOLN: 6.0000 mg | Freq: Once | INTRAVENOUS | Status: AC

## 2013-02-07 MED ORDER — ASPIRIN EC 81 MG PO TBEC
81.0000 mg | DELAYED_RELEASE_TABLET | Freq: Every day | ORAL | Status: DC
  Administered 2013-02-08: 81 mg via ORAL
  Filled 2013-02-07: qty 1

## 2013-02-07 MED ORDER — METOPROLOL TARTRATE 1 MG/ML IV SOLN: 10.0000 mg | Freq: Once | INTRAVENOUS | Status: DC

## 2013-02-07 MED ORDER — CLONAZEPAM 1 MG PO TABS
1.0000 mg | ORAL_TABLET | Freq: Every day | ORAL | Status: DC
  Administered 2013-02-07: 1 mg via ORAL
  Filled 2013-02-07: qty 1

## 2013-02-07 MED ORDER — NITROGLYCERIN 0.4 MG SL SUBL: 0.4000 mg | SUBLINGUAL_TABLET | SUBLINGUAL | Status: DC | PRN

## 2013-02-07 NOTE — ED Notes (Signed)
Verbal order to hold lopressor at this time due to blood pressures.

## 2013-02-07 NOTE — ED Notes (Signed)
Patient report obtained, care taken over at this time.  

## 2013-02-07 NOTE — ED Notes (Signed)
ekg given to dr. Knapp 

## 2013-02-07 NOTE — Consult Note (Addendum)
Patient ID: Tara [name] MRN: [id number], DOB/AGE: 67/10/1945   Admit date: 02/07/2013   Primary Physician: Margaret Simpson, MD Primary Cardiologist: new to [facility] - seen by P. Lauree Yurick, MD  Pt. Profile:  67 y/o female w/o prior cardiac hx who presented to the ED today with SVT, rates above 200.  Problem List  Past Medical History  Diagnosis Date  . H/O: hysterectomy   . Breast cancer     right - s/p lumpectomy->chemo  . History of cancer chemotherapy   . History of radiation therapy   . Depression   . GERD (gastroesophageal reflux disease)   . SVT (supraventricular tachycardia)     a. 2008 Echo: EF 55%;  b. ER visit with SVT 01/2013  . Hiatal hernia   . Hyperlipidemia     Past Surgical History  Procedure Laterality Date  . Abdominal hysterectomy  1994  . Total abdominal hysterectomy w/ bilateral salpingoophorectomy  1994  . Cholecystectomy  1999  . Lumpectomy right breast  2008    Allergies  No Known Allergies  HPI  67 y/o female with h/o Breast CA s/p lumpectomy/chemo.  She was in her usoh today when she went for a routine psychiatry visit and was noted to be tachycardic.  She was advised to present to the ED where she was found to be in SVT at a rate of 215. She was hypotensive with and SBP of 81.  She was completely asymptomatic and denied chest pain, sob, palpitations, presyncope, n, v, diaphoresis.  She was initially treated with adenosine 6mg IV and converted to sinus but a few mins later she went back into SVT and was treated with 12mg of adenosine, again converting to sinus.  She is currently in sinus and remains asymptomatic.  Home Medications  Prior to Admission medications   Medication Sig Start Date End Date Taking? Authorizing Provider  cholecalciferol (VITAMIN D) 1000 UNITS tablet Take 1,000 Units by mouth daily.   Yes Historical Provider, MD  clonazePAM (KLONOPIN) 1 MG tablet Take 1 mg by mouth at bedtime.  09/28/12  Yes Historical Provider, MD   lansoprazole (PREVACID) 30 MG capsule Take 30 mg by mouth daily. For acid reflux   Yes Historical Provider, MD  lovastatin (MEVACOR) 40 MG tablet Take 40 mg by mouth at bedtime.   Yes Historical Provider, MD  methylphenidate (METADATE ER) 20 MG ER tablet Take 20 mg by mouth every morning.  09/30/11  Yes Historical Provider, MD  methylphenidate (RITALIN) 5 MG tablet Take 5 mg by mouth 2 (two) times daily.  09/30/11  Yes Historical Provider, MD  metoCLOPramide (REGLAN) 10 MG tablet Take 10 mg by mouth 4 (four) times daily.   Yes Historical Provider, MD  sertraline (ZOLOFT) 50 MG tablet Take 50 mg by mouth daily.    Yes Historical Provider, MD  zolpidem (AMBIEN) 10 MG tablet Take 10 mg by mouth at bedtime as needed for sleep.    Yes Historical Provider, MD   Family History  Family History  Problem Relation Age of Onset  . Colon cancer Father   . Cancer Father     colon   . Hypertension Sister     Social History  History   Social History  . Marital Status: Married    Spouse Name: N/A    Number of Children: 2  . Years of Education: N/A   Occupational History  . disabled     Social History Main Topics  . Smoking status: Never Smoker   .   Smokeless tobacco: Not on file  . Alcohol Use: No  . Drug Use: No  . Sexually Active: Not on file   Other Topics Concern  . Not on file   Social History Narrative   Lives in [place] with family.  Does not routinely exercise.     Review of Systems General:  No chills, fever, night sweats or weight changes.  Cardiovascular:  No chest pain, dyspnea on exertion, edema, orthopnea, palpitations, paroxysmal nocturnal dyspnea. Dermatological: No rash, lesions/masses Respiratory: No cough, dyspnea Urologic: No hematuria, dysuria Abdominal:   No nausea, vomiting, diarrhea, bright red blood per rectum, melena, or hematemesis Neurologic:  No visual changes, wkns, changes in mental status. All other systems reviewed and are otherwise negative except  as noted above.  Physical Exam  Blood pressure 104/67, pulse 93, temperature 98.7 F (37.1 C), temperature source Oral, resp. rate 28, SpO2 98.00%.  General: Pleasant, NAD Psych: Normal affect. Neuro: Alert and oriented X 3. Moves all extremities spontaneously. HEENT: Normal  Neck: Supple without bruits or JVD. Lungs:  Resp regular and unlabored, CTA. Heart: RRR no s3, s4, or murmurs. Abdomen: Soft, non-tender, non-distended, BS + x 4.  Extremities: No clubbing, cyanosis or edema. DP/PT/Radials 2+ and equal bilaterally.  Labs  Lab Results  Component Value Date   WBC 13.9* 02/07/2013   HGB 12.9 02/07/2013   HCT 39.0 02/07/2013   MCV 81.3 02/07/2013   PLT 260 02/07/2013     Recent Labs Lab 02/07/13 1608  NA 141  K 4.0  CL 104  CO2 21  BUN 15  CREATININE 1.21*  CALCIUM 9.6  GLUCOSE 107*   Lab Results  Component Value Date   CHOL 173 07/26/2012   HDL 59 07/26/2012   LDLCALC 104* 07/26/2012   TRIG 52 07/26/2012   Radiology/Studies  Dg Chest Portable 1 View  02/07/2013   *RADIOLOGY REPORT*  Clinical Data: Tachycardia.  Hypotension.  Breast carcinoma.  PORTABLE CHEST - 1 VIEW  Comparison: 09/22/2007  Findings: Heart size is stable allowing for portable technique. Right hilar and paratracheal lymph node calcifications again seen, consistent with old granulomatous disease.  Both lungs are clear. No evidence of pleural effusion.  No mass or lymphadenopathy identified.  Surgical clips again seen in the right axilla.  IMPRESSION: No active disease.   Original Report Authenticated By: John Stahl, M.D.   ECG  SVT, 209, lvh, left axis.  No acute st/t changes.  ASSESSMENT AND PLAN  1. SVT:  Asymptomatic.  Broke with 6mg of IV adenosine, recurred, then broke again with 12mg of IV adenosine.  Currently maintaining sinus.  Will repeat 12 lead ECG and check troponin, Mg, TSH.  Add low-dose metoprolol (bp soft) and plan to d/c from ED provided that CE negative.  We will arrange for outpt  echo and office f/u within the next 1-2 wks.  She will require education on checking her pulse.  Because she was asymptomatic, we will need to consider outpt monitoring and EP eval for RFCA (perhaps sooner rather than later if BP cannot tolerate low dose bb), as she will be at risk for tachy induced cardiomyopathy.  If troponin returns abnl, will plan to observe and pursue ischemic eval.  2.  HL:  On statin @ home.  LDL 104 in November.   Signed, Christopher Berge, NP 02/07/2013, 5:45 PM   Attending Note:   The patient was seen and examined.  Agree with assessment and plan as noted above.  Changes made to the   above note as needed.  Pt feels well.  It is surprising that she did  not have any symptoms with this HR of 209.  It makes it difficult for us to know how much SVT she is having.  If her labs are normal, she may go home.  If the troponin is elevated, I would be ok keeping her overnight for observation.  We will get an echo this week. Start her on Metoprolol 12. 5 BID.   I will see her in 1-2 weeks.  30 day monitor to look for recurrent SVT.   Aaleah Hirsch J. Nakina Spatz, Jr., MD, FACC 02/07/2013, 6:14 PM   

## 2013-02-07 NOTE — ED Notes (Signed)
Patient heart rate returning to 180's, patient states she feels no different and is asymptomatic, Dr, Lynelle Doctor notified and adenosine 6 mg ordered and administered, patient heart rate returned to mid 80's. Patient in NAD at this time

## 2013-02-07 NOTE — ED Notes (Signed)
Pt sent here from [facility] for tachycardia; pt denies complaint but noted HR 215 bpm and hypotensive SBP 81; pt appears pale but denies pain or SOB

## 2013-02-07 NOTE — ED Notes (Signed)
Patient up to bedside commode, without difficulty.

## 2013-02-07 NOTE — ED Notes (Signed)
Patient report called to [place] for admission of SVT, nurse voiced understanding of report and had no further questions. Patient is in stable condition at this time for transport. Patient will be going to room via stretcher to room 2904.

## 2013-02-07 NOTE — ED Provider Notes (Addendum)
Presents with tahcycardia.  Pt is asymptomatic.  At doctor's office for routine visit when they found her to be extremely tachycardic.  Pt denies any symptoms.  Physical Exam  BP 81/45  Pulse 213  Temp(Src) 98.7 F (37.1 C) (Oral)  Resp 16  SpO2 95%  Physical Exam Alert, no distress Car tachycardic Lungs CTA Neuro Alert, oriented ED Course  Procedures 6 mg adenosine with chemical cardioversion Patient went back into sinus rhythm but then went back into a narrow complex tachycardia. She was given additional 12 mg of adenosine with resolution of the tachycardia The patient up having a third episode that responded to 6 mg of adenosine. Cardiology was once again consult and the plan was for observation and admission  EKG following adenosine Normal sinus rhythm, rate 97 Normal axis, normal intervals Normal ST-T wave  MDM Pt asymptomatic.  Narrow complex tachycadia.  Responded to adenosine.  Will monitor, check electrolytes    I saw and evaluated the patient, reviewed the resident's note and I agree with the findings and plan. I reviewed and interpreted the EKG during the patient's evaluation in the ED and agree with the resident's interpretation.       Celene Kras, MD 02/08/13 6045  Celene Kras, MD 02/08/13 [phone]

## 2013-02-07 NOTE — ED Provider Notes (Signed)
History     CSN: [id number]  Arrival date & time 02/07/13  1543   First MD Initiated Contact with Patient 02/07/13 1549      Chief Complaint  Patient presents with  . Tachycardia  . Hypotension     HPI 67 year old female with no history of cardiac disease presents with tachycardia. She was seen at her primary care physician's office less than 1 hour prior to arrival and found to have a heart rate in the 200 range. She was seen by her PCP today for a regular checkup and was previously asymptomatic. Since she discovered that her heart rate was fast she has not had any chest pain, shortness of breath, palpitations, lightheadedness, nausea, vomiting, or any other significant symptoms. She states that she has no symptoms now, feels well, and would not have known that she had tachycardia if she not gone to her physician's office today. She states she's never had SVT, atrial fibrillation, or any other arrhythmia in the past. She does not have a history of coronary artery disease, congestive heart failure, hypertension, diabetes, or any other chronic medical problems. Her only significant medical history is remote right-sided breast cancer status post lumpectomy. She's never had PE or DVT. She denies caffeine use, nicotine use, recent illness.  On arrival she is tachycardic to 213, hypotensive to 81/45, her radial pulses are nonpalpable, however she is in no acute distress, nontoxic appearing, not diaphoretic, not visibly dyspneic. Her initial EKG demonstrates supraventricular tachycardia with a rate of 209.  Past Medical History  Diagnosis Date  . H/O: hysterectomy   . Breast cancer     right - s/p lumpectomy->chemo  . History of cancer chemotherapy   . History of radiation therapy   . Depression   . GERD (gastroesophageal reflux disease)   . SVT (supraventricular tachycardia)     a. 2008 Echo: EF 55%;  b. ER visit with SVT 01/2013  . Hiatal hernia   . Hyperlipidemia     Past Surgical  History  Procedure Laterality Date  . Abdominal hysterectomy  1994  . Total abdominal hysterectomy w/ bilateral salpingoophorectomy  1994  . Cholecystectomy  1999  . Lumpectomy right breast  2008    Family History  Problem Relation Age of Onset  . Colon cancer Father   . Cancer Father     colon   . Hypertension Sister     History  Substance Use Topics  . Smoking status: Never Smoker   . Smokeless tobacco: Not on file  . Alcohol Use: No    OB History   Grav Para Term Preterm Abortions TAB SAB Ect Mult Living                  Review of Systems  Constitutional: Negative for fever, chills and diaphoresis.  HENT: Negative for congestion, rhinorrhea, neck pain and neck stiffness.   Respiratory: Negative for cough, shortness of breath and wheezing.   Cardiovascular: Negative for chest pain and leg swelling.  Gastrointestinal: Negative for nausea, vomiting, abdominal pain and diarrhea.  Genitourinary: Negative for dysuria, urgency, frequency, flank pain, vaginal bleeding, vaginal discharge and difficulty urinating.  Skin: Negative for rash.  Neurological: Negative for weakness, numbness and headaches.  All other systems reviewed and are negative.    Allergies  Review of patient's allergies indicates no known allergies.  Home Medications   Current Outpatient Rx  Name  Route  Sig  Dispense  Refill  . cholecalciferol (VITAMIN D) 1000 UNITS tablet  Oral   Take 1,000 Units by mouth daily.         . clonazePAM (KLONOPIN) 1 MG tablet   Oral   Take 1 mg by mouth at bedtime.          . lansoprazole (PREVACID) 30 MG capsule   Oral   Take 30 mg by mouth daily. For acid reflux         . lovastatin (MEVACOR) 40 MG tablet   Oral   Take 40 mg by mouth at bedtime.         . methylphenidate (METADATE ER) 20 MG ER tablet   Oral   Take 20 mg by mouth every morning.          . methylphenidate (RITALIN) 5 MG tablet   Oral   Take 5 mg by mouth 2 (two) times daily.           . metoCLOPramide (REGLAN) 10 MG tablet   Oral   Take 10 mg by mouth 4 (four) times daily.         . sertraline (ZOLOFT) 50 MG tablet   Oral   Take 50 mg by mouth daily.          Marland Kitchen zolpidem (AMBIEN) 10 MG tablet   Oral   Take 10 mg by mouth at bedtime as needed for sleep.          . verapamil (CALAN-SR) 120 MG CR tablet   Oral   Take 1 tablet (120 mg total) by mouth daily.   30 tablet   3     BP 105/48  Pulse 75  Temp(Src) 98.6 F (37 C) (Oral)  Resp 18  Ht 4\' 11"  (1.499 m)  Wt 161 lb 2.5 oz (73.1 kg)  BMI 32.53 kg/m2  SpO2 97%  Physical Exam  Nursing note and vitals reviewed. Constitutional: She is oriented to person, place, and time. She appears well-developed and well-nourished. No distress.  HENT:  Head: Normocephalic and atraumatic.  Mouth/Throat: Oropharynx is clear and moist.  Eyes: Conjunctivae and EOM are normal. Pupils are equal, round, and reactive to light. No scleral icterus.  Neck: Normal range of motion. Neck supple. No JVD present.  Cardiovascular: Regular rhythm, normal heart sounds and intact distal pulses.  Tachycardia present.  Exam reveals no gallop and no friction rub.   No murmur heard. Pulses:      Radial pulses are 0 on the right side, and 0 on the left side.  Pulmonary/Chest: Effort normal and breath sounds normal. No respiratory distress. She has no wheezes. She has no rales.  Abdominal: Soft. Bowel sounds are normal. She exhibits no distension. There is no tenderness. There is no rebound and no guarding.  Musculoskeletal: She exhibits no edema.  Neurological: She is alert and oriented to person, place, and time. No cranial nerve deficit. She exhibits normal muscle tone. Coordination normal.  Skin: Skin is warm and dry. She is not diaphoretic.    ED Course  Procedures (including critical care time)  Labs Reviewed  CBC - Abnormal; Notable for the following:    WBC 13.9 (*)    All other components within normal limits   BASIC METABOLIC PANEL - Abnormal; Notable for the following:    Glucose, Bld 107 (*)    Creatinine, Ser 1.21 (*)    GFR calc non Af Amer 45 (*)    GFR calc Af Amer 52 (*)    All other components within normal limits  PHOSPHORUS -  Abnormal; Notable for the following:    Phosphorus 4.9 (*)    All other components within normal limits  URINALYSIS, ROUTINE W REFLEX MICROSCOPIC - Abnormal; Notable for the following:    APPearance HAZY (*)    All other components within normal limits  CBC - Abnormal; Notable for the following:    Hemoglobin 10.5 (*)    HCT 31.9 (*)    All other components within normal limits  MRSA PCR SCREENING  GRAM STAIN  MAGNESIUM  TSH  T4, FREE  TROPONIN I  TROPONIN I  TROPONIN I  LIPID PANEL  POCT I-STAT TROPONIN I   Dg Chest Portable 1 View  02/07/2013   *RADIOLOGY REPORT*  Clinical Data: Tachycardia.  Hypotension.  Breast carcinoma.  PORTABLE CHEST - 1 VIEW  Comparison: 09/22/2007  Findings: Heart size is stable allowing for portable technique. Right hilar and paratracheal lymph node calcifications again seen, consistent with old granulomatous disease.  Both lungs are clear. No evidence of pleural effusion.  No mass or lymphadenopathy identified.  Surgical clips again seen in the right axilla.  IMPRESSION: No active disease.   Original Report Authenticated By: Myles Rosenthal, M.D.     Date: 02/07/2013  Rate: 209  Rhythm: supraventricular tachycardia (SVT)  QRS Axis: indeterminate  Intervals: normal  ST/T Wave abnormalities: nonspecific T wave changes  Conduction Disutrbances:none  Narrative Interpretation:   Old EKG Reviewed: SVT replaces normal sinus rhythm    1. SVT (supraventricular tachycardia)       MDM  67 yo F presents with SVT, rate 215. Asymptomatic.  Found incidentally at PCP's office during routine visit.  On arrival, tachycardia to 213, hypotensive to 81/45, weak radial pulse, but otherwise well appearing and asymptomatic.  Broke with  adenosine 6 mg.  Recurred in less than 1/2 hour.  Broke again with 12 mg.  Both times converted to normal sinus rhythm with no acute ischemic changes.  Recurred again after 1 hour.  Again resolved with 6 mg of adenosine.  She was given metoprolol PO.  Admitted to cardiology.  Workup otherwise significant for leukocytosis, creatinine 1.21, Hb 10.5.  Negative troponin.        Toney Sang, MD 02/08/13 [phone]

## 2013-02-07 NOTE — ED Notes (Signed)
Patient heart rate returned to sustained 170's-200's.  Lynelle Doctor, MD notified. Patient administered additional 12mg  of Adenosine.  Patient converting at this time with heart rate returning to 80's- 100's.

## 2013-02-07 NOTE — ED Notes (Signed)
Pt given 6 mg adenosine, in SVT. Pt a x 4

## 2013-02-07 NOTE — Progress Notes (Signed)
Pt with recurrent SVT in the ED, we will observe on tele tonight.  Obtain echo while in house and as EP to eval.

## 2013-02-08 DIAGNOSIS — I369 Nonrheumatic tricuspid valve disorder, unspecified: Secondary | ICD-10-CM

## 2013-02-08 DIAGNOSIS — I498 Other specified cardiac arrhythmias: Secondary | ICD-10-CM

## 2013-02-08 LAB — CBC
Hemoglobin: 10.5 g/dL — ABNORMAL LOW (ref 12.0–15.0)
MCH: 26.6 pg (ref 26.0–34.0)
MCHC: 32.9 g/dL (ref 30.0–36.0)
MCV: 80.8 fL (ref 78.0–100.0)
RBC: 3.95 MIL/uL (ref 3.87–5.11)

## 2013-02-08 LAB — LIPID PANEL
Total CHOL/HDL Ratio: 2.6 RATIO
VLDL: 16 mg/dL (ref 0–40)

## 2013-02-08 LAB — TROPONIN I: Troponin I: <0.3 ng/mL (ref <0.30)

## 2013-02-08 MED ORDER — VERAPAMIL HCL ER 120 MG PO TBCR
120.0000 mg | EXTENDED_RELEASE_TABLET | Freq: Every day | ORAL | Status: DC
  Administered 2013-02-08: 120 mg via ORAL
  Filled 2013-02-08: qty 1

## 2013-02-08 MED ORDER — METOPROLOL SUCCINATE ER 25 MG PO TB24
25.0000 mg | ORAL_TABLET | Freq: Every day | ORAL | Status: DC
  Administered 2013-02-08: 25 mg via ORAL
  Filled 2013-02-08: qty 1

## 2013-02-08 MED ORDER — SODIUM CHLORIDE 0.9 % IV SOLN
Freq: Once | INTRAVENOUS | Status: AC
  Administered 2013-02-08: 03:00:00 via INTRAVENOUS

## 2013-02-08 MED ORDER — VERAPAMIL HCL ER 120 MG PO TBCR: 120.0000 mg | EXTENDED_RELEASE_TABLET | Freq: Every day | ORAL | Status: DC

## 2013-02-08 MED ORDER — ATORVASTATIN CALCIUM 10 MG PO TABS
10.0000 mg | ORAL_TABLET | Freq: Every day | ORAL | Status: DC
  Filled 2013-02-08: qty 1

## 2013-02-08 NOTE — Discharge Summary (Signed)
ELECTROPHYSIOLOGY DISCHARGE SUMMARY    Patient ID: Tara [name],  MRN: [id number], DOB/AGE: [birth year] 67 y.o.  Admit date: 02/07/2013 Discharge date: 02/08/2013  Primary Care Physician: Syliva Overman, MD Primary Cardiologist: New to [facility]; Collis Thede, MD  Primary Discharge Diagnosis:  1. PSVT - adenosine sensitive short RP tachycardia  Secondary Discharge Diagnoses:  1. Depression 2. GERD 3. Breast CA s/p resection, chemo and XRT 2008 4. Dyslipidemia  Procedures This Admission:  1. 2-D echocardiogram 02/08/2013 (results pending at the time of this dictation)   History and Hospital Course:  Ms. Bleiler is a 67 year old woman with no prior cardiac history who was found to be tachycardic during a routine psychiatric visit yesterday. She was sent to [facility] ED for further evaluation where she was found to have a regular, narrow complex tachycardia consistent with SVT. She was hypotensive with SBP 80. She was asymptomatic and denies CP, SOB, palpitations, dizziness or syncope. She was initially treated with 6 mg IV adenosine with conversion to SR; however, a few minutes later she went back into SVT, again without symptoms, and was successfully converted with 12 mg IV adenosine. Overnight MI was ruled out with serial negative enzymes. Her thyroid studies were unremarkable. Echo is pending. She was seen in consultation by Dr. Hillis Range who recommended medical therapy with verapamil 120 mg once daily. She remains hemodynamically stable. She has been seen, examined and deemed stable for discharge by Dr. Johney Frame. She will follow-up with Dr. Johney Frame in clinic in 4 weeks.  Discharge Vitals: Blood pressure 114/63, pulse 72, temperature 98.2 F (36.8 C), temperature source Oral, resp. rate 18, height 4\' 11"  (1.499 m), weight 161 lb 2.5 oz (73.1 kg), SpO2 98.00%.   Labs: Lab Results  Component Value Date   WBC 9.4 02/08/2013   HGB 10.5* 02/08/2013   HCT 31.9* 02/08/2013   MCV 80.8 02/08/2013   PLT  188 02/08/2013    Recent Labs Lab 02/07/13 1608  NA 141  K 4.0  CL 104  CO2 21  BUN 15  CREATININE 1.21*  CALCIUM 9.6  GLUCOSE 107*   Lab Results  Component Value Date   TROPONINI <0.30 02/08/2013     Disposition:  The patient is being discharged in stable condition.  Follow-up: Follow-up Information   Follow up with Hillis Range, MD On 03/14/2013. (At 11:45 AM)    Contact information:   [facility] [address] [place] [address] [phone]     Discharge Medications:    Medication List    TAKE these medications       cholecalciferol 1000 UNITS tablet  Commonly known as:  VITAMIN D  Take 1,000 Units by mouth daily.     clonazePAM 1 MG tablet  Commonly known as:  KLONOPIN  Take 1 mg by mouth at bedtime.     lansoprazole 30 MG capsule  Commonly known as:  PREVACID  Take 30 mg by mouth daily. For acid reflux     lovastatin 40 MG tablet  Commonly known as:  MEVACOR  Take 40 mg by mouth at bedtime.     methylphenidate 20 MG ER tablet  Commonly known as:  METADATE ER  Take 20 mg by mouth every morning.     methylphenidate 5 MG tablet  Commonly known as:  RITALIN  Take 5 mg by mouth 2 (two) times daily.     metoCLOPramide 10 MG tablet  Commonly known as:  REGLAN  Take 10 mg by  mouth 4 (four) times daily.     sertraline 50 MG tablet  Commonly known as:  ZOLOFT  Take 50 mg by mouth daily.     verapamil 120 MG CR tablet  Commonly known as:  CALAN-SR  Take 1 tablet (120 mg total) by mouth daily.     zolpidem 10 MG tablet  Commonly known as:  AMBIEN  Take 10 mg by mouth at bedtime as needed for sleep.       Duration of Discharge Encounter: Greater than 30 minutes including physician time.  Limmie Patricia, PA-C 02/08/2013, 12:03 PM   Hillis Range MD

## 2013-02-08 NOTE — Progress Notes (Addendum)
    Subjective:  The patient was admitted yesterday with SVT, requiring 6 mg -> 12 mg -> 6 mg of adenosine, with the last dose yesterday evening.  Overnight, the patient's HR has mainly been in the 60's, with a couple of spikes to the 80's (NSR), but no recurrence of SVT.  Troponins have been negative x3, and TSH/free T4 have been normal.  Objective:  Vital Signs in the last 24 hours: Temp:  [98.2 F (36.8 C)-98.7 F (37.1 C)] 98.2 F (36.8 C) (05/20 0400) Pulse Rate:  [61-213] 61 (05/20 0400) Resp:  [13-28] 15 (05/20 0400) BP: (81-124)/(45-85) 84/51 mmHg (05/20 0400) SpO2:  [95 %-99 %] 97 % (05/20 0400) Weight:  [161 lb 2.5 oz (73.1 kg)] 161 lb 2.5 oz (73.1 kg) (05/19 2200)  Intake/Output from previous day: 05/19 0701 - 05/20 0700 In: 500 [I.V.:500] Out: 150 [Urine:150]  Physical Exam: General: alert, cooperative, and in no apparent distress HEENT: pupils equal round and reactive to light, vision grossly intact, oropharynx clear and non-erythematous  Neck: supple, no lymphadenopathy, no JVD Lungs: clear to ascultation bilaterally, normal work of respiration, no wheezes, rales, ronchi Heart: regular rate and rhythm, no murmurs, gallops, or rubs Abdomen: soft, non-tender, non-distended, normal bowel sounds Extremities: no cyanosis, clubbing, or edema Neurologic: alert & oriented X3, cranial nerves II-XII intact, strength grossly intact, sensation intact to light touch    Lab Results:  Recent Labs  02/07/13 1608 02/08/13 0425  WBC 13.9* 9.4  HGB 12.9 10.5*  PLT 260 188    Recent Labs  02/07/13 1608  NA 141  K 4.0  CL 104  CO2 21  GLUCOSE 107*  BUN 15  CREATININE 1.21*    Recent Labs  02/07/13 2251 02/08/13 0425  TROPONINI <0.30 <0.30    Tele: NSR with HR in the 60's, occasionally in the 80's.  Frequent PVC's.  Assessment/Plan:  The patient is a 67 yo woman, history of breast cancer, presenting with new-onset asymptomatic SVT.  # SVT - the patient  presented with asymptomatic SVT with HR in the 200's, which converted with adenosine, but recurred, requiring a total of 3 doses of adenosine on 5/19.  The patient has no history of similar episodes.  Troponins have been negative x3, and TSH/free T4 were normal.  BP's have been low-normal, limiting use of BB's. -echo ordered, pending -continue metoprolol 12.5 mg BID -EP consulted, appreciate recs -aspirin  # AKI - Cr mildly elevated to 1.2 yesterday.  Repeat BMET this morning pending.  Differential includes ATN (SVT, borderline low BP) vs pre-renal.  -trend BMETs  # Hyperlipidemia -continue statin  Janalyn Harder, M.D. 02/08/2013, 6:49 AM  Patient seen with resident, agree with the above note.  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  Patient had one prior known episode of SVT when she had a surgery years ago.  She is on methylphenidate but has been on this for years.  She denies caffeine yesterday.  No definite trigger for episode.  She has not been on a beta blocker or CCB.  Will start Toprol XL 25 mg daily.  If BP tolerates this and no further SVT, would try medical treatment.  If SVT recurs or BP does not tolerate meds, would consider ablation.  Will monitor today, if no further SVT on meds will send home this evening. Followup with Dr. Johney Frame in office.    [name] 02/08/2013 8:01 AM

## 2013-02-08 NOTE — Care Management Note (Signed)
    Page 1 of 1   02/08/2013     10:03:17 AM   CARE MANAGEMENT NOTE 02/08/2013  Patient:  Tara [name], Tara [name]   Account Number:  [id number]  Date Initiated:  02/08/2013  Documentation initiated by:  Junius Creamer  Subjective/Objective Assessment:   adm w svt     Action/Plan:   lives w husband, pcp dr m simpson   Anticipated DC Date:     Anticipated DC Plan:        DC Planning Services  CM consult      Choice offered to / List presented to:             Status of service:   Medicare Important Message given?   (If response is "NO", the following Medicare IM given date fields will be blank) Date Medicare IM given:   Date Additional Medicare IM given:    Discharge Disposition:    Per UR Regulation:  Reviewed for med. necessity/level of care/duration of stay  If discussed at Long Length of Stay Meetings, dates discussed:    Comments:

## 2013-02-08 NOTE — Consult Note (Signed)
ELECTROPHYSIOLOGY CONSULT NOTE  Patient ID: Tara [name] MRN: [id number], DOB/AGE: July 17, 1946   Admit date: 02/07/2013 Date of Consult: 02/08/2013  Primary Physician: Tara Overman, MD Primary Cardiologist: New to [place] Reason for Consultation: SVT  History of Present Illness Tara [name] is a 67 year old woman with no prior cardiac history who was found to be tachycardic during a routine psychiatric visit yesterday. She was sent to [facility] ED for further evaluation where she was found to have a regular, narrow complex tachycardia consistent with SVT. She was hypotensive with SBP 80. She was asymptomatic and denies CP, SOB, palpitations, dizziness or syncope. She was initially treated with 6 mg IV adenosine with conversion to SR; however, a few minutes later she went back into SVT, again without symptoms, and was successfully converted with 12 mg IV adenosine. Overnight MI was ruled out with serial negative enzymes. Her thyroid studies were unremarkable. Echo is pending.  Past Medical History Past Medical History  Diagnosis Date  . H/O: hysterectomy   . Breast cancer     right - s/p lumpectomy->chemo  . History of cancer chemotherapy   . History of radiation therapy   . Depression   . GERD (gastroesophageal reflux disease)   . SVT (supraventricular tachycardia)     a. 2008 Echo: EF 55%;  b. ER visit with SVT 01/2013  . Hiatal hernia   . Hyperlipidemia     Past Surgical History Past Surgical History  Procedure Laterality Date  . Abdominal hysterectomy  1994  . Total abdominal hysterectomy w/ bilateral salpingoophorectomy  1994  . Cholecystectomy  1999  . Lumpectomy right breast  2008    Allergies/Intolerances No Known Allergies  Inpatient Medications . adenosine (ADENOCARD) IV  6 mg Intravenous Once  . aspirin EC  81 mg Oral Daily  . cholecalciferol  1,000 Units Oral Daily  . clonazePAM  1 mg Oral QHS  . heparin  5,000 Units Subcutaneous Q8H  . metoCLOPramide  10 mg Oral  QID  . metoprolol tartrate  12.5 mg Oral BID  . pantoprazole  40 mg Oral Daily  . sertraline  50 mg Oral Daily  . simvastatin  20 mg Oral q1800  . sodium chloride  3 mL Intravenous Q12H   Family History Family History  Problem Relation Age of Onset  . Colon cancer Father   . Cancer Father     colon   . Hypertension Sister     Social History Social History  . Marital Status: Married   Occupational History  . disabled     Social History Main Topics  . Smoking status: Never Smoker   . Smokeless tobacco: Not on file  . Alcohol Use: No  . Drug Use: No   Social History Narrative   Lives in [place] with family.  Does not routinely exercise.    Review of Systems General: No chills, fever, night sweats or weight changes  Cardiovascular:  No chest pain, dyspnea on exertion, edema, orthopnea, palpitations, paroxysmal nocturnal dyspnea Dermatological: No rash, lesions or masses Respiratory: No cough, dyspnea Urologic: No hematuria, dysuria Abdominal: No nausea, vomiting, diarrhea, bright red blood per rectum, melena, or hematemesis Neurologic: No visual changes, weakness, changes in mental status All other systems reviewed and are otherwise negative except as noted above.  Physical Exam Blood pressure 84/51, pulse 61, temperature 98.2 F (36.8 C), temperature source Oral, resp. rate 15, height 4\' 11"  (1.499 m), weight 161 lb 2.5 oz (73.1 kg), SpO2 97.00%.  General:  Well developed, well appearing 67 year old female in no acute distress. HEENT: Normocephalic, atraumatic. EOMs intact. Sclera nonicteric. Oropharynx clear.  Neck: Supple without bruits. No JVD. Lungs: Respirations regular and unlabored, CTA bilaterally. No wheezes, rales or rhonchi. Heart: RRR. S1, S2 present. No murmurs, rub, S3 or S4. Abdomen: Soft, non-tender, non-distended. BS present x 4 quadrants. No hepatosplenomegaly.  Extremities: No clubbing, cyanosis or edema. DP/PT/Radials 2+ and equal  bilaterally. Psych: Normal affect. Neuro: Alert and oriented X 3. Moves all extremities spontaneously. Musculoskeletal: No kyphosis. Skin: Intact. Warm and dry. No rashes or petechiae in exposed areas.   Labs  Recent Labs  02/07/13 2251 02/08/13 0425  TROPONINI <0.30 <0.30   Lab Results  Component Value Date   WBC 9.4 02/08/2013   HGB 10.5* 02/08/2013   HCT 31.9* 02/08/2013   MCV 80.8 02/08/2013   PLT 188 02/08/2013    Recent Labs Lab 02/07/13 1608  NA 141  K 4.0  CL 104  CO2 21  BUN 15  CREATININE 1.21*  CALCIUM 9.6  GLUCOSE 107*   Lab Results  Component Value Date   CHOL 153 02/08/2013   HDL 60 02/08/2013   LDLCALC 77 02/08/2013   TRIG 81 02/08/2013    Recent Labs  02/07/13 1647  TSH 0.779    Radiology/Studies Dg Chest Portable 1 View  02/07/2013   *RADIOLOGY REPORT*  Clinical Data: Tachycardia.  Hypotension.  Breast carcinoma.  PORTABLE CHEST - 1 VIEW  Comparison: 09/22/2007  Findings: Heart size is stable allowing for portable technique. Right hilar and paratracheal lymph node calcifications again seen, consistent with old granulomatous disease.  Both lungs are clear. No evidence of pleural effusion.  No mass or lymphadenopathy identified.  Surgical clips again seen in the right axilla.  IMPRESSION: No active disease.   Original Report Authenticated By: Myles Rosenthal, M.D.   Echocardiogram  pending  12-lead ECG done in ED shows a regular, narrow complex tachycardia at 209 bpm  Telemetry currently NSR   Assessment and Plan 1. SVT  Asymptomatic  ECG findings most consistent with SVT Agree with echo  Discussed possible treatment options - medical therapy versus EPS +RF ablation Recommend medical therapy for now unless becomes symptomatic and/or increases in frequency despite medical therapy  Dr. Johney Frame to see Signed, Rick Duff, PA-C 02/08/2013, 6:51 AM  I have seen, examined the patient, and reviewed the above assessment and plan.  Changes to above  are made where necessary.  She presents with adenosine sensitive short RP SVT.  She has been stable overnight. Therapeutic strategies for supraventricular tachycardia including medicine and ablation were discussed in detail with the patient today. Risk, benefits, and alternatives to EP study and radiofrequency ablation were also discussed in detail today.   She is clear that she is not interested in ablation at this time.  She favors medical therapy.  Given depression, I would treat with varapamil rather than metoprolol. Start verapamil 120mg  daily Follow-up with me in 4 weeks  DC to home today  Co Sign: Hillis Range, MD 02/08/2013 11:29 AM

## 2013-02-08 NOTE — Progress Notes (Signed)
  Echocardiogram 2D Echocardiogram has been performed.  Anisten Tomassi 02/08/2013, 11:10 AM 

## 2013-02-08 NOTE — H&P (Signed)
Patient ID: Tara [name] MRN: [id number], DOB/AGE: 1946-07-12   Admit date: 02/07/2013   Primary Physician: Syliva Overman, MD Primary Cardiologist: new to [place] - seen by Katherina Right, MD  Pt. Profile:  67 y/o female w/o prior cardiac hx who presented to the ED today with SVT, rates above 200.  Problem List  Past Medical History  Diagnosis Date  . H/O: hysterectomy   . Breast cancer     right - s/p lumpectomy->chemo  . History of cancer chemotherapy   . History of radiation therapy   . Depression   . GERD (gastroesophageal reflux disease)   . SVT (supraventricular tachycardia)     a. 2008 Echo: EF 55%;  b. ER visit with SVT 01/2013  . Hiatal hernia   . Hyperlipidemia     Past Surgical History  Procedure Laterality Date  . Abdominal hysterectomy  1994  . Total abdominal hysterectomy w/ bilateral salpingoophorectomy  1994  . Cholecystectomy  1999  . Lumpectomy right breast  2008    Allergies  No Known Allergies  HPI  67 y/o female with h/o Breast CA s/p lumpectomy/chemo.  She was in her usoh today when she went for a routine psychiatry visit and was noted to be tachycardic.  She was advised to present to the ED where she was found to be in SVT at a rate of 215. She was hypotensive with and SBP of 81.  She was completely asymptomatic and denied chest pain, sob, palpitations, presyncope, n, v, diaphoresis.  She was initially treated with adenosine 6mg  IV and converted to sinus but a few mins later she went back into SVT and was treated with 12mg  of adenosine, again converting to sinus.  She is currently in sinus and remains asymptomatic.  Home Medications  Prior to Admission medications   Medication Sig Start Date End Date Taking? Authorizing Provider  cholecalciferol (VITAMIN D) 1000 UNITS tablet Take 1,000 Units by mouth daily.   Yes Historical Provider, MD  clonazePAM (KLONOPIN) 1 MG tablet Take 1 mg by mouth at bedtime.  09/28/12  Yes Historical Provider, MD   lansoprazole (PREVACID) 30 MG capsule Take 30 mg by mouth daily. For acid reflux   Yes Historical Provider, MD  lovastatin (MEVACOR) 40 MG tablet Take 40 mg by mouth at bedtime.   Yes Historical Provider, MD  methylphenidate (METADATE ER) 20 MG ER tablet Take 20 mg by mouth every morning.  09/30/11  Yes Historical Provider, MD  methylphenidate (RITALIN) 5 MG tablet Take 5 mg by mouth 2 (two) times daily.  09/30/11  Yes Historical Provider, MD  metoCLOPramide (REGLAN) 10 MG tablet Take 10 mg by mouth 4 (four) times daily.   Yes Historical Provider, MD  sertraline (ZOLOFT) 50 MG tablet Take 50 mg by mouth daily.    Yes Historical Provider, MD  zolpidem (AMBIEN) 10 MG tablet Take 10 mg by mouth at bedtime as needed for sleep.    Yes Historical Provider, MD   Family History  Family History  Problem Relation Age of Onset  . Colon cancer Father   . Cancer Father     colon   . Hypertension Sister     Social History  History   Social History  . Marital Status: Married    Spouse Name: N/A    Number of Children: 2  . Years of Education: N/A   Occupational History  . disabled     Social History Main Topics  . Smoking status: Never Smoker   .  Smokeless tobacco: Not on file  . Alcohol Use: No  . Drug Use: No  . Sexually Active: Not on file   Other Topics Concern  . Not on file   Social History Narrative   Lives in [place] with family.  Does not routinely exercise.     Review of Systems General:  No chills, fever, night sweats or weight changes.  Cardiovascular:  No chest pain, dyspnea on exertion, edema, orthopnea, palpitations, paroxysmal nocturnal dyspnea. Dermatological: No rash, lesions/masses Respiratory: No cough, dyspnea Urologic: No hematuria, dysuria Abdominal:   No nausea, vomiting, diarrhea, bright red blood per rectum, melena, or hematemesis Neurologic:  No visual changes, wkns, changes in mental status. All other systems reviewed and are otherwise negative except  as noted above.  Physical Exam  Blood pressure 104/67, pulse 93, temperature 98.7 F (37.1 C), temperature source Oral, resp. rate 28, SpO2 98.00%.  General: Pleasant, NAD Psych: Normal affect. Neuro: Alert and oriented X 3. Moves all extremities spontaneously. HEENT: Normal  Neck: Supple without bruits or JVD. Lungs:  Resp regular and unlabored, CTA. Heart: RRR no s3, s4, or murmurs. Abdomen: Soft, non-tender, non-distended, BS + x 4.  Extremities: No clubbing, cyanosis or edema. DP/PT/Radials 2+ and equal bilaterally.  Labs  Lab Results  Component Value Date   WBC 13.9* 02/07/2013   HGB 12.9 02/07/2013   HCT 39.0 02/07/2013   MCV 81.3 02/07/2013   PLT 260 02/07/2013     Recent Labs Lab 02/07/13 1608  NA 141  K 4.0  CL 104  CO2 21  BUN 15  CREATININE 1.21*  CALCIUM 9.6  GLUCOSE 107*   Lab Results  Component Value Date   CHOL 173 07/26/2012   HDL 59 07/26/2012   LDLCALC 104* 07/26/2012   TRIG 52 07/26/2012   Radiology/Studies  Dg Chest Portable 1 View  02/07/2013   *RADIOLOGY REPORT*  Clinical Data: Tachycardia.  Hypotension.  Breast carcinoma.  PORTABLE CHEST - 1 VIEW  Comparison: 09/22/2007  Findings: Heart size is stable allowing for portable technique. Right hilar and paratracheal lymph node calcifications again seen, consistent with old granulomatous disease.  Both lungs are clear. No evidence of pleural effusion.  No mass or lymphadenopathy identified.  Surgical clips again seen in the right axilla.  IMPRESSION: No active disease.   Original Report Authenticated By: Myles Rosenthal, M.D.   ECG  SVT, 209, lvh, left axis.  No acute st/t changes.  ASSESSMENT AND PLAN  1. SVT:  Asymptomatic.  Broke with 6mg  of IV adenosine, recurred, then broke again with 12mg  of IV adenosine.  Currently maintaining sinus.  Will repeat 12 lead ECG and check troponin, Mg, TSH.  Add low-dose metoprolol (bp soft) and plan to d/c from ED provided that CE negative.  We will arrange for outpt  echo and office f/u within the next 1-2 wks.  She will require education on checking her pulse.  Because she was asymptomatic, we will need to consider outpt monitoring and EP eval for RFCA (perhaps sooner rather than later if BP cannot tolerate low dose bb), as she will be at risk for tachy induced cardiomyopathy.  If troponin returns abnl, will plan to observe and pursue ischemic eval.  2.  HL:  On statin @ home.  LDL 104 in November.   Signed, Nicolasa Ducking, NP 02/07/2013, 5:45 PM   Attending Note:   The patient was seen and examined.  Agree with assessment and plan as noted above.  Changes made to the  above note as needed.  Pt feels well.  It is surprising that she did  not have any symptoms with this HR of 209.  It makes it difficult for [place] to know how much SVT she is having.  If her labs are normal, she may go home.  If the troponin is elevated, I would be ok keeping her overnight for observation.  We will get an echo this week. Start her on Metoprolol 12. 5 BID.   I will see her in 1-2 weeks.  30 day monitor to look for recurrent SVT.   Vesta Mixer, Montez Hageman., MD, [facility] 02/07/2013, 6:14 PM

## 2013-03-14 ENCOUNTER — Encounter: Payer: Self-pay | Admitting: Internal Medicine

## 2013-03-14 ENCOUNTER — Ambulatory Visit (INDEPENDENT_AMBULATORY_CARE_PROVIDER_SITE_OTHER): Payer: Medicare Other | Admitting: Internal Medicine

## 2013-03-14 VITALS — BP 122/73 | HR 61 | Wt 164.4 lb

## 2013-03-14 DIAGNOSIS — I471 Supraventricular tachycardia: Secondary | ICD-10-CM

## 2013-03-14 DIAGNOSIS — I5023 Acute on chronic systolic (congestive) heart failure: Secondary | ICD-10-CM

## 2013-03-14 DIAGNOSIS — I5022 Chronic systolic (congestive) heart failure: Secondary | ICD-10-CM | POA: Insufficient documentation

## 2013-03-14 DIAGNOSIS — I509 Heart failure, unspecified: Secondary | ICD-10-CM

## 2013-03-14 DIAGNOSIS — I498 Other specified cardiac arrhythmias: Secondary | ICD-10-CM

## 2013-03-14 DIAGNOSIS — I428 Other cardiomyopathies: Secondary | ICD-10-CM

## 2013-03-14 LAB — BASIC METABOLIC PANEL
Calcium: 9 mg/dL (ref 8.4–10.5)
GFR: 66.43 mL/min (ref >60.00)
Potassium: 3.9 mEq/L (ref 3.5–5.1)
Sodium: 143 mEq/L (ref 135–145)

## 2013-03-14 NOTE — Patient Instructions (Addendum)
Your physician recommends that you schedule a follow-up appointment in: 4 months with Dr Johney Frame  Your physician has requested that you have an echocardiogram. Echocardiography is a painless test that uses sound waves to create images of your heart. It provides your doctor with information about the size and shape of your heart and how well your heart's chambers and valves are working. This procedure takes approximately one hour. There are no restrictions for this procedure.---prior to appointment in 4 months  Your physician has recommended you make the following change in your medication:  1) Start lisinopril 5mg  daily   Your physician recommends that you return for lab work today: BMP

## 2013-03-14 NOTE — Progress Notes (Signed)
PCP:  Syliva Overman, MD  The patient presents today for routine electrophysiology followup.  Since her hospital discharge, the patient reports doing very well.  She has had only two short episodes of tachypalpitations (lasting only several seconds).  She is unaware of sustained arrhythmias.  Today, she denies symptoms of chest pain, shortness of breath, orthopnea, PND, lower extremity edema, dizziness, presyncope, syncope, or neurologic sequela.  The patient feels that she is tolerating medications without difficulties and is otherwise without complaint today.   Past Medical History  Diagnosis Date  . H/O: hysterectomy   . Breast cancer     right - s/p lumpectomy->chemo  . History of cancer chemotherapy   . History of radiation therapy   . Depression   . GERD (gastroesophageal reflux disease)   . 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 Hiatal hernia   . Hyperlipidemia   . Nonischemic cardiomyopathy    Past Surgical History  Procedure Laterality Date  . Abdominal hysterectomy  1994  . Total abdominal hysterectomy w/ bilateral salpingoophorectomy  1994  . Cholecystectomy  1999  . Lumpectomy right breast  2008    Current Outpatient Prescriptions  Medication Sig Dispense Refill  . cholecalciferol (VITAMIN D) 1000 UNITS tablet Take 1,000 Units by mouth daily.      . clonazePAM (KLONOPIN) 1 MG tablet Take 1 mg by mouth at bedtime.       . lansoprazole (PREVACID) 30 MG capsule Take 30 mg by mouth daily. For acid reflux      . lovastatin (MEVACOR) 40 MG tablet Take 40 mg by mouth at bedtime.      . methylphenidate (METADATE ER) 20 MG ER tablet Take 20 mg by mouth every morning.       . metoCLOPramide (REGLAN) 10 MG tablet Take 10 mg by mouth 4 (four) times daily.      . sertraline (ZOLOFT) 50 MG tablet Take 50 mg by mouth daily.       . verapamil (CALAN-SR) 120 MG CR tablet Take 1 tablet (120 mg total) by mouth daily.  30 tablet  3  . zolpidem  (AMBIEN) 10 MG tablet Take 10 mg by mouth at bedtime as needed for sleep.        No current facility-administered medications for this visit.    No Known Allergies  History   Social History  . Marital Status: Married    Spouse Name: N/A    Number of Children: 2  . Years of Education: N/A   Occupational History  . disabled     Social History Main Topics  . Smoking status: Never Smoker   . Smokeless tobacco: Not on file  . Alcohol Use: No  . Drug Use: No  . Sexually Active: Not on file   Other Topics Concern  . Not on file   Social History Narrative   Lives in [place] with family.  Does not routinely exercise.    Family History  Problem Relation Age of Onset  . Colon cancer Father   . Cancer Father     colon   . Hypertension Sister     ROS-  All systems are reviewed and are negative except as outlined in the HPI above  Physical Exam: Filed Vitals:   03/14/13 1133  BP: 122/73  Pulse: 61  Weight: 164 lb 6.4 oz (74.571 kg)    GEN- The patient is well appearing, alert and oriented x 3 today.  Head- normocephalic, atraumatic Eyes-  Sclera clear, conjunctiva pink Ears- hearing intact Oropharynx- clear Neck- supple, no JVP Lymph- no cervical lymphadenopathy Lungs- Clear to ausculation bilaterally, normal work of breathing Heart- Regular rate and rhythm, no murmurs, rubs or gallops, PMI not laterally displaced GI- soft, NT, ND, + BS Extremities- no clubbing, cyanosis, or edema MS- no significant deformity or atrophy Skin- no rash or lesion Psych- euthymic mood, full affect Neuro- strength and sensation are intact  ekg today reveals sinus rhythm 62 bpm, with PACs, nonspecific ST/T Changes  Assessment and Plan:  1. SVT- short RP SVT previously documented She reports that this is controlled with verapamil.  She is clear that she is not interested in ablation at this time  2. Nonischemic CM- EF 40% in the setting of immediately following prolonged  SVT. Add lisinopril, check bmet today Repeat echo in 3 months If EF is normal then no further workup at that point.  If her EF remains depressed then we may consider stress testing to exclude ischemia She says she is not taking methylphenidate.  I think that she should avoid this medicine long term.  I have encouraged her to discuss this with her psychiatrist  Return in 2 months

## 2013-03-18 ENCOUNTER — Other Ambulatory Visit: Payer: Self-pay | Admitting: *Deleted

## 2013-03-18 MED ORDER — LISINOPRIL 5 MG PO TABS: 5.0000 mg | ORAL_TABLET | Freq: Every day | ORAL | Status: DC

## 2013-03-18 NOTE — Telephone Encounter (Signed)
Spoke to [place] on the phone. She called to ask about her new prescription Lisinopril that had not been filled since she was here on Monday 03/14/2013.  Refill sent

## 2013-06-06 ENCOUNTER — Telehealth: Payer: Self-pay | Admitting: Internal Medicine

## 2013-06-06 MED ORDER — VERAPAMIL HCL ER 120 MG PO TBCR: 120.0000 mg | EXTENDED_RELEASE_TABLET | Freq: Every day | ORAL | Status: DC

## 2013-06-06 NOTE — Telephone Encounter (Signed)
Continue to take.  RX sent in for refill

## 2013-06-06 NOTE — Telephone Encounter (Signed)
New Problem  Pt states she has 2 more pills of verapamil/// wants to know if she should discontinue or continue or get a refill.

## 2013-07-13 ENCOUNTER — Encounter: Payer: Self-pay | Admitting: Internal Medicine

## 2013-07-13 ENCOUNTER — Ambulatory Visit (HOSPITAL_COMMUNITY): Payer: Medicare Other | Attending: Cardiology | Admitting: Cardiology

## 2013-07-13 ENCOUNTER — Ambulatory Visit (INDEPENDENT_AMBULATORY_CARE_PROVIDER_SITE_OTHER): Payer: Medicare Other | Admitting: Internal Medicine

## 2013-07-13 ENCOUNTER — Telehealth: Payer: Self-pay | Admitting: Family Medicine

## 2013-07-13 ENCOUNTER — Encounter (INDEPENDENT_AMBULATORY_CARE_PROVIDER_SITE_OTHER): Payer: Self-pay

## 2013-07-13 VITALS — BP 110/72 | HR 64 | Ht <= 58 in | Wt 165.0 lb

## 2013-07-13 DIAGNOSIS — I509 Heart failure, unspecified: Secondary | ICD-10-CM | POA: Insufficient documentation

## 2013-07-13 DIAGNOSIS — I5023 Acute on chronic systolic (congestive) heart failure: Secondary | ICD-10-CM

## 2013-07-13 DIAGNOSIS — I079 Rheumatic tricuspid valve disease, unspecified: Secondary | ICD-10-CM | POA: Insufficient documentation

## 2013-07-13 DIAGNOSIS — R5383 Other fatigue: Secondary | ICD-10-CM

## 2013-07-13 DIAGNOSIS — I498 Other specified cardiac arrhythmias: Secondary | ICD-10-CM

## 2013-07-13 DIAGNOSIS — I428 Other cardiomyopathies: Secondary | ICD-10-CM

## 2013-07-13 DIAGNOSIS — E785 Hyperlipidemia, unspecified: Secondary | ICD-10-CM | POA: Insufficient documentation

## 2013-07-13 DIAGNOSIS — I471 Supraventricular tachycardia: Secondary | ICD-10-CM

## 2013-07-13 DIAGNOSIS — R5381 Other malaise: Secondary | ICD-10-CM

## 2013-07-13 LAB — BASIC METABOLIC PANEL
Chloride: 103 mEq/L (ref 96–112)
Potassium: 4.3 mEq/L (ref 3.5–5.1)

## 2013-07-13 NOTE — Telephone Encounter (Signed)
Please call pt, let her know that when i reviewed her cardiology visit note, I would like her to consider reducing ambien dose for 10mg  to 5 mg at bedtime since she reports excessive tiredness and sleepiness. I think her psych prescribes , if so, she will need to discuss this with that Doc,If I do, and she agrees , then [pls,send in new dose of 5mg  1 at bedtime ambien #30 refill 3 and let her know

## 2013-07-13 NOTE — Progress Notes (Signed)
PCP:  Syliva Overman, MD  The patient presents today for routine electrophysiology followup.  Since her hospital discharge, the patient reports doing very well  She is unaware of sustained arrhythmias.   She is tired during the day and does not feel well rested when she wakes. Today, she denies symptoms of chest pain, shortness of breath, orthopnea, PND, lower extremity edema, dizziness, presyncope, syncope, or neurologic sequela.  The patient feels that she is tolerating medications without difficulties and is otherwise without complaint today.   Past Medical History  Diagnosis Date  . H/O: hysterectomy   . Breast cancer     right - s/p lumpectomy->chemo  . History of cancer chemotherapy   . History of radiation therapy   . Depression   . GERD (gastroesophageal reflux disease)   . 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 Hiatal hernia   . Hyperlipidemia   . Nonischemic cardiomyopathy    Past Surgical History  Procedure Laterality Date  . Abdominal hysterectomy  1994  . Total abdominal hysterectomy w/ bilateral salpingoophorectomy  1994  . Cholecystectomy  1999  . Lumpectomy right breast  2008    Current Outpatient Prescriptions  Medication Sig Dispense Refill  . cholecalciferol (VITAMIN D) 1000 UNITS tablet Take 1,000 Units by mouth daily.      . clonazePAM (KLONOPIN) 1 MG tablet Take 1 mg by mouth at bedtime.       . lansoprazole (PREVACID) 30 MG capsule Take 30 mg by mouth daily. For acid reflux      . lisinopril (PRINIVIL,ZESTRIL) 5 MG tablet Take 1 tablet (5 mg total) by mouth daily.  30 tablet  6  . lovastatin (MEVACOR) 40 MG tablet Take 40 mg by mouth at bedtime.      . metoCLOPramide (REGLAN) 10 MG tablet Take 10 mg by mouth 4 (four) times daily.      . sertraline (ZOLOFT) 50 MG tablet Take 50 mg by mouth daily.       . verapamil (CALAN-SR) 120 MG CR tablet Take 1 tablet (120 mg total) by mouth daily.  90 tablet  3  . zolpidem (AMBIEN) 10  MG tablet Take 10 mg by mouth at bedtime as needed for sleep.        No current facility-administered medications for this visit.    No Known Allergies  History   Social History  . Marital Status: Married    Spouse Name: N/A    Number of Children: 2  . Years of Education: N/A   Occupational History  . disabled     Social History Main Topics  . Smoking status: Never Smoker   . Smokeless tobacco: Not on file  . Alcohol Use: No  . Drug Use: No  . Sexual Activity: Not on file   Other Topics Concern  . Not on file   Social History Narrative   Lives in [place] with family.  Does not routinely exercise.    Family History  Problem Relation Age of Onset  . Colon cancer Father   . Cancer Father     colon   . Hypertension Sister     ROS-  All systems are reviewed and are negative except as outlined in the HPI above  Physical Exam: Filed Vitals:   07/13/13 1029  BP: 110/72  Pulse: 64  Height: 4\' 9"  (1.448 m)  Weight: 165 lb (74.844 kg)    GEN- The patient is well appearing, alert and  oriented x 3 today.   Head- normocephalic, atraumatic Eyes-  Sclera clear, conjunctiva pink Ears- hearing intact Oropharynx- clear Neck- supple, no JVP Lymph- no cervical lymphadenopathy Lungs- Clear to ausculation bilaterally, normal work of breathing Heart- Regular rate and rhythm, no murmurs, rubs or gallops, PMI not laterally displaced GI- soft, NT, ND, + BS Extremities- no clubbing, cyanosis, or edema MS- no significant deformity or atrophy Skin- no rash or lesion Psych- euthymic mood, full affect Neuro- strength and sensation are intact  Echo today is reviewed with Dr Myrtis Ser  Assessment and Plan:  1. SVT- short RP SVT previously documented She reports that this is controlled with verapamil.  She is clear that she is not interested in ablation at this time  2. Nonischemic CM-  Repeat echo reveals EF 45% Will proceed with myoview to evaluate for ischemia as the  cause. Continue lisinopril Consider switching verapamil to coreg upon return, though with daytime fatigue, I am reluctant to do so.  3. Fatigue Sleep study Ask PCP about reducing ambien which may be contributing to daytime fatigue  Return to see Lawson Fiscal in 6 weeks I will see in 3 months

## 2013-07-13 NOTE — Patient Instructions (Signed)
Your physician recommends that you schedule a follow-up appointment in: 6 weeks with Norma Fredrickson, NP and 3 months with Dr Johney Frame  Your physician has recommended that you have a sleep study. This test records several body functions during sleep, including: brain activity, eye movement, oxygen and carbon dioxide blood levels, heart rate and rhythm, breathing rate and rhythm, the flow of air through your mouth and nose, snoring, body muscle movements, and chest and belly movement.   Your physician has requested that you have a lexiscan myoview. For further information please visit [email or web address] Please follow instruction sheet, as given.  Your physician recommends that you return for lab work: BMP

## 2013-07-13 NOTE — Telephone Encounter (Signed)
Patient aware and will discuss with psychiatrist about reducing the dose

## 2013-07-13 NOTE — Progress Notes (Signed)
Echo performed. 

## 2013-07-28 ENCOUNTER — Encounter (HOSPITAL_COMMUNITY): Payer: Medicare Other

## 2013-08-02 ENCOUNTER — Encounter: Payer: Self-pay | Admitting: Family Medicine

## 2013-08-02 ENCOUNTER — Ambulatory Visit (INDEPENDENT_AMBULATORY_CARE_PROVIDER_SITE_OTHER): Payer: Medicare Other | Admitting: Family Medicine

## 2013-08-02 VITALS — BP 96/64 | HR 76 | Resp 18 | Ht 59.5 in | Wt 166.1 lb

## 2013-08-02 DIAGNOSIS — E785 Hyperlipidemia, unspecified: Secondary | ICD-10-CM

## 2013-08-02 DIAGNOSIS — Z1211 Encounter for screening for malignant neoplasm of colon: Secondary | ICD-10-CM

## 2013-08-02 DIAGNOSIS — Z0000 Encounter for general adult medical examination without abnormal findings: Secondary | ICD-10-CM

## 2013-08-02 DIAGNOSIS — Z800 Family history of malignant neoplasm of digestive organs: Secondary | ICD-10-CM

## 2013-08-02 DIAGNOSIS — D539 Nutritional anemia, unspecified: Secondary | ICD-10-CM

## 2013-08-02 LAB — FERRITIN: Ferritin: 564 ng/mL — ABNORMAL HIGH (ref 10–291)

## 2013-08-02 LAB — HEPATIC FUNCTION PANEL
ALT: <8 U/L (ref 0–35)
Albumin: 3.7 g/dL (ref 3.5–5.2)
Total Protein: 6.6 g/dL (ref 6.0–8.3)

## 2013-08-02 LAB — CBC WITH DIFFERENTIAL/PLATELET
Eosinophils Absolute: 0.3 10*3/uL (ref 0.0–0.7)
Lymphocytes Relative: 23 % (ref 12–46)
Lymphs Abs: 2.4 10*3/uL (ref 0.7–4.0)
MCH: 26.3 pg (ref 26.0–34.0)
Neutrophils Relative %: 66 % (ref 43–77)
Platelets: 225 10*3/uL (ref 150–400)
RBC: 4.29 MIL/uL (ref 3.87–5.11)
WBC: 10.1 10*3/uL (ref 4.0–10.5)

## 2013-08-02 LAB — LIPID PANEL
Cholesterol: 159 mg/dL (ref 0–200)
HDL: 53 mg/dL (ref >39)
Total CHOL/HDL Ratio: 3 Ratio
Triglycerides: 85 mg/dL (ref <150)
VLDL: 17 mg/dL (ref 0–40)

## 2013-08-02 NOTE — Patient Instructions (Signed)
F/u in 6 month, call if you need me before  You will get information on the flu vaccine in the howe that you change your mind about taking this  Please commit to making life style changes as we discussed to improve your health  Weight loss goal of 6 to 8 pounds in 6 month   Labs today, lipid, hepatic, cbc , iron and ferritin  You will be referred to Dr Darrick Penna colonoscopy is overdue based on family history

## 2013-08-02 NOTE — Progress Notes (Signed)
Subjective:    Patient ID: Tara [name], female    DOB: 04-06-1946, 67 y.o.   MRN: [id number]  HPI Preventive Screening-Counseling & Management   Patient present here today for a Medicare annual wellness visit.   Current Problems (verified)   Medications Prior to Visit Allergies (verified)   PAST HISTORY  Family History: as recorded  Social History Married x 46 years mother of 2 adult children.No h/o nicotine or drug use Worked in healthcare up  To age 37, disabled   At age 60 due to mental health issues  Risk Factors  Current exercise habits:  None significant, again strongly encouraged to make a commitment to regular exercise routine  Dietary issues discussed:low fat diet rich in vegetable and fruit, water intake, and reduced portion sizes to help with needed weight loss   Cardiac risk factors: Rapid heart rate developed in 2014  Depression Screen  (Note: if answer to either of the following is "Yes", a more complete depression screening is indicated)  Treated by psychiatry for depression and anxiety for over 10 years, disabled due to mental health Over the past two weeks, have you felt down, depressed or hopeless? No  Over the past two weeks, have you felt little interest or pleasure in doing things? No  Have you lost interest or pleasure in daily life? No  Do you often feel hopeless? No  Do you cry easily over simple problems? No   Activities of Daily Living  In your present state of health, do you have any difficulty performing the following activities?  Driving?: limited as far  As distance is concerned and generally drives during the daytime only Managing money?: No Feeding yourself?:No Getting from bed to chair?:No Climbing a flight of stairs?:No Preparing food and eating?:No Bathing or showering?:No Getting dressed?:No Getting to the toilet?:No Using the toilet?:No Moving around from place to place?: No  Fall Risk Assessment In the past year have you  fallen or had a near fall?:No Are you currently taking any medications that make you dizzy?:No   Hearing Difficulties: No Do you often ask people to speak up or repeat themselves?:No Do you experience ringing or noises in your ears?:No Do you have difficulty understanding soft or whispered voices?:No  Cognitive Testing  Alert? Yes Normal Appearance?Yes  Oriented to person? Yes Place? Yes  Time? Yes  Displays appropriate judgment?Yes  Can read the correct time from a watch face? yes Are you having problems remembering things?No  Advanced Directives have been discussed with the patient?Yes , full code   List the Names of Other Physician/Practitioners you currently use: cardiology [facility], Psychiatry [facility], Oncology Dr Welton Flakes, Doctor's vision   Indicate any recent Medical Services you may have received from other than [facility] providers in the past year (date may be approximate).   Assessment:    Annual Wellness Exam   Plan:    During the course of the visit the patient was educated and counseled about appropriate screening and preventive services including:  A healthy diet is rich in fruit, vegetables and whole grains. Poultry fish, nuts and beans are a healthy choice for protein rather then red meat. A low sodium diet and drinking 64 ounces of water daily is generally recommended. Oils and sweet should be limited. Carbohydrates especially for those who are diabetic or overweight, should be limited to 30-45 gram per meal. It is important to eat on a regular schedule, at least 3 times daily. Snacks should be primarily fruits, vegetables or nuts.  It is important that you exercise regularly at least 30 minutes 5 times a week. If you develop chest pain, have severe difficulty breathing, or feel very tired, stop exercising immediately and seek medical attention  Immunization reviewed and updated. Cancer screening reviewed and updated    Patient Instructions (the written plan) was given to  the patient.  Medicare Attestation  I have personally reviewed:  The patient's medical and social history  Their use of alcohol, tobacco or illicit drugs  Their current medications and supplements  The patient's functional ability including ADLs,fall risks, home safety risks, cognitive, and hearing and visual impairment  Diet and physical activities  Evidence for depression or mood disorders  The patient's weight, height, BMI, and visual acuity have been recorded in the chart. I have made referrals, counseling, and provided education to the patient based on review of the above and I have provided the patient with a written personalized care plan for preventive services.      Review of Systems     Objective:   Physical Exam        Assessment & Plan:

## 2013-08-04 ENCOUNTER — Telehealth: Payer: Self-pay

## 2013-08-04 NOTE — Telephone Encounter (Signed)
Pt was referred by Dr. Lodema Hong for colonoscopy. She has a family history of colon cancer in her father at age 67. ( Letter was sent to her 12/18/2011 to call to get scheduled). I called and LMOM for a return call.

## 2013-08-07 NOTE — Assessment & Plan Note (Signed)
Annual wellness as documented. Pt needs still to start regular physical activity and reduced portion intake to facilitate weight loss. Mental health is good, and she has no limitation in her ADL's Continues to refuse immunizations

## 2013-08-09 ENCOUNTER — Telehealth: Payer: Self-pay | Admitting: *Deleted

## 2013-08-09 ENCOUNTER — Encounter (HOSPITAL_BASED_OUTPATIENT_CLINIC_OR_DEPARTMENT_OTHER): Payer: Medicare Other

## 2013-08-09 NOTE — Telephone Encounter (Signed)
Reviewing staff messages in [facility] inbasket. The patient cancelled her myoview on 11/6 and sleep study on 11/18.

## 2013-08-22 ENCOUNTER — Other Ambulatory Visit: Payer: Self-pay

## 2013-08-22 MED ORDER — LANSOPRAZOLE 30 MG PO CPDR: 30.0000 mg | DELAYED_RELEASE_CAPSULE | Freq: Every day | ORAL | Status: DC

## 2013-08-24 ENCOUNTER — Ambulatory Visit: Payer: Medicare Other | Admitting: Nurse Practitioner

## 2013-09-02 NOTE — Telephone Encounter (Signed)
Letter to pt and note to SS in workqueue.

## 2013-10-05 ENCOUNTER — Other Ambulatory Visit: Payer: Self-pay | Admitting: Emergency Medicine

## 2013-10-05 DIAGNOSIS — C50919 Malignant neoplasm of unspecified site of unspecified female breast: Secondary | ICD-10-CM

## 2013-10-06 ENCOUNTER — Ambulatory Visit (HOSPITAL_BASED_OUTPATIENT_CLINIC_OR_DEPARTMENT_OTHER): Payer: Medicare Other | Admitting: Oncology

## 2013-10-06 ENCOUNTER — Encounter: Payer: Self-pay | Admitting: Oncology

## 2013-10-06 ENCOUNTER — Telehealth: Payer: Self-pay | Admitting: Oncology

## 2013-10-06 ENCOUNTER — Encounter (INDEPENDENT_AMBULATORY_CARE_PROVIDER_SITE_OTHER): Payer: Self-pay

## 2013-10-06 ENCOUNTER — Other Ambulatory Visit (HOSPITAL_BASED_OUTPATIENT_CLINIC_OR_DEPARTMENT_OTHER): Payer: Medicare Other

## 2013-10-06 VITALS — BP 139/83 | HR 80 | Temp 98.5°F | Resp 20 | Ht 59.5 in | Wt 168.3 lb

## 2013-10-06 DIAGNOSIS — C50919 Malignant neoplasm of unspecified site of unspecified female breast: Secondary | ICD-10-CM

## 2013-10-06 DIAGNOSIS — Z853 Personal history of malignant neoplasm of breast: Secondary | ICD-10-CM

## 2013-10-06 LAB — CBC WITH DIFFERENTIAL/PLATELET
BASO%: 0.4 % (ref 0.0–2.0)
BASOS ABS: 0 10*3/uL (ref 0.0–0.1)
EOS%: 4.5 % (ref 0.0–7.0)
Eosinophils Absolute: 0.5 10*3/uL (ref 0.0–0.5)
HEMATOCRIT: 36.2 % (ref 34.8–46.6)
HEMOGLOBIN: 11.8 g/dL (ref 11.6–15.9)
LYMPH#: 2.3 10*3/uL (ref 0.9–3.3)
LYMPH%: 20.9 % (ref 14.0–49.7)
MCH: 26.9 pg (ref 25.1–34.0)
MCHC: 32.7 g/dL (ref 31.5–36.0)
MCV: 82.4 fL (ref 79.5–101.0)
MONO#: 0.7 10*3/uL (ref 0.1–0.9)
MONO%: 6.5 % (ref 0.0–14.0)
NEUT#: 7.4 10*3/uL — ABNORMAL HIGH (ref 1.5–6.5)
NEUT%: 67.7 % (ref 38.4–76.8)
PLATELETS: 219 10*3/uL (ref 145–400)
RBC: 4.39 10*6/uL (ref 3.70–5.45)
RDW: 14.6 % — ABNORMAL HIGH (ref 11.2–14.5)
WBC: 10.9 10*3/uL — AB (ref 3.9–10.3)

## 2013-10-06 LAB — COMPREHENSIVE METABOLIC PANEL (CC13)
ALT: 10 U/L (ref 0–55)
ANION GAP: 10 meq/L (ref 3–11)
AST: 12 U/L (ref 5–34)
Albumin: 3.3 g/dL — ABNORMAL LOW (ref 3.5–5.0)
Alkaline Phosphatase: 107 U/L (ref 40–150)
BILIRUBIN TOTAL: 0.33 mg/dL (ref 0.20–1.20)
BUN: 17.9 mg/dL (ref 7.0–26.0)
CALCIUM: 9.1 mg/dL (ref 8.4–10.4)
CHLORIDE: 108 meq/L (ref 98–109)
CO2: 27 meq/L (ref 22–29)
CREATININE: 1.1 mg/dL (ref 0.6–1.1)
GLUCOSE: 103 mg/dL (ref 70–140)
Potassium: 4 mEq/L (ref 3.5–5.1)
Sodium: 145 mEq/L (ref 136–145)
Total Protein: 7.2 g/dL (ref 6.4–8.3)

## 2013-10-06 NOTE — Patient Instructions (Signed)
Breast Cancer Survivor Follow-Up Breast cancer begins when cells in the breast divide too rapidly. The extra cells form a lump (tumor). When the cancer is treated, the goal is to get rid of all cancer cells. However, sometimes a few cells survive. These cancer cells can then grow. They become recurrent cancer. This means the cancer comes back after treatment.  Most cases of recurrent breast cancer develop 3 to 5 years after treatment. However, sometimes it comes back just a few months after treatment. Other times, it does not come back until years later. If the cancer comes back in the same area as the first breast cancer, it is called a local recurrence. If the cancer comes back somewhere else in the body, it is called regional recurrence if the site is fairly near the breast or distant recurrence if it is far from the breast. Your caregiver may also use the term metastasize to indicate a cancer that has gone to another part of your body. Treatment is still possible after either kind of recurrence. The cancer can still be controlled.  CAUSES OF RECURRENT CANCER No one knows exactly why breast cancer starts in the first place. Why the cancer comes back after treatment is also not clear. It is known that certain conditions, called risk factors, can make this more likely. They include:  Developing breast cancer for the first time before age 60.  Having breast cancer that involves the lymph nodes. These are small, round pieces of tissue found all over the body. Their job is to help fight infections.  Having a large tumor. Cancer is more apt to come back if the first tumor was bigger than 2 inches (5 cm).  Having certain types of breast cancer, such as:  Inflammatory breast cancer. This rare type grows rapidly and causes the breast to become red and swollen.  A high-grade tumor. The grade of a tumor indicates how fast it will grow and spread. High-grade tumors grow more quickly than other types.  HER2  cancer. This refers to the tumor's genetic makeup. Tumors that have this type of gene are more likely to come back after treatment.  Having close tumor margins. This refers to the space between the tumor and normal, noncancerous cells. If the space is small, the tumor has a greater chance of coming back.  Having treatment involving a surgery to remove the tumor but not the entire breast (lumpectomy) and no radiation therapy. CARE AFTER BREAST CANCER Home Monitoring Women who have had breast cancer should continue to examine their breasts every month. The goal is to catch the cancer quickly if it comes back. Many women find it helpful to do so on the same day each month and to mark the calendar as a reminder. Let your caregiver know immediately if you have any signs of recurrent breast cancer. Symptoms will vary, depending on where the cancer recurs. The original type of treatment can also make a difference. Symptoms of local recurrence after a lumpectomy or a recurrence in the opposite breast may include:  A new lump or thickening in the breast.  A change in the way the skin looks on the breast (such as a rash, dimpling, or wrinkling).  Redness or swelling of the breast.  Changes in the nipple (such as being red, puckered, swollen, or leaking fluid). Symptoms of a recurrence after a breast removal surgery (mastectomy) may include:  A lump or thickening under the skin.  A thickening around the mastectomy scar. Symptoms   of regional recurrence in the lymph nodes near the breast may include:  A lump under the arm or above the collarbone.  Swelling of the arm.  Pain in the arm, shoulder, or chest.  Numbness in the hand or arm. Symptoms of distant recurrence may include:  A cough that does not go away.  Trouble breathing or shortness of breath.  Pain in the bones or the chest. This is pain that lasts or does not respond to rest and medicine.  Headaches.  Sudden vision  problems.  Dizziness.  Nausea or vomiting.  Losing weight without trying to.  Persistent abdominal pain.  Changes in bowel movements or blood in the stool.  Yellowing of the skin or eyes (jaundice).  Blood in the urine or bloody vaginal discharge. Clinical Monitoring  It is helpful to keep a schedule of appointments for needed tests and exams. This includes physical exams, breast exams, exams of the lymph nodes, and general exams.  For the first 3 years after being treated for breast cancer, see your caregiver every 3 to 6 months.  For years 4 and 5 after breast cancer, see your caregiver every 6 to 12 months.  After 5 years, see your caregiver at least once a year.  Regular breast X-rays (mammograms) should continue even if you had a mastectomy.  A mammogram should be done 1 year after the mammogram that first detected breast cancer.  A mammogram should be done every 6 to 12 months after that. Follow your caregiver's advice.  A pelvic exam done by your caregiver checks whether female organs are the normal size and shape. The exam is usually done every year. Ask your caregiver if that schedule is right for you.  Women taking tamoxifen should report any vaginal bleeding immediately to their caregiver. Tamoxifen is often given to women with a certain type of breast cancer. It has been shown to help prevent recurrence.  You will need to decide who your primary caregiver will be.  Most people continue to see their cancer specialist (oncologist) every 3 to 6 months for the first year after cancer treatment.  At some point, you may want to go back to seeing your family caregiver. You would no longer see your oncologist for regular checkups. Many women do this about 1 year after their first diagnosis of breast cancer.  You will still need to be seen every so often by your oncologist. Ask how often that should be. Coordinate this with your family or primary caregiver.  Think about  having genetic counseling. This would provide information on traits that can be passed or inherited from one generation to the next. In some cases, breast cancer runs in families. Tell your caregiver if you:  Are of Ashkenazi Jewish heritage.  Have any family member who has had ovarian cancer.  Have a mother, sister, or daughter who had breast cancer before age 7.  Have 2 or more close relatives who have had breast cancer. This means a mother, sister, daughter, aunt, or grandmother.  Had breast cancer in both breasts.  Have a female relative who has had breast cancer.  Some tests are not recommended for routine screening. Someone recovering from breast cancer does not need to have these tests if there are no problems. The tests have risks, such as radiation exposure, and can be costly. The risks of these tests are thought to be greater than the benefits:  Blood tests.  Chest X-rays.  Bone scans.  Liver ultrasound.  Computed tomography (CT scan).  Positron emission tomography (PET scan).  Magnetic resonance imaging (MRI scan). DIAGNOSIS OF RECURRENT CANCER Recurrent breast cancer may be suspected for various reasons. A mammogram may not look normal. You might feel a lump or have other symptoms. Your caregiver may find something unusual during an exam. To be sure, your caregiver will probably order some tests. The tests are needed because there are symptoms or hints of a problem. They could include:  Blood tests, including a test to check how well the liver is working. The liver is a common site for a distant cancer recurrence.  Imaging tests that create pictures of the inside of the body. These tests include:  Chest X-rays to show if the cancer has come back in the lungs.  CT scans to create detailed pictures of various areas of the body and help find a distant recurrence.  MRI scans to find anything unusual in the breast, chest, or lymph nodes.  Breast ultrasound tests to  examine the breasts.  Bone scans to create a picture of your whole skeleton and find cancer in bony areas.  PET scans to create an image of the whole body. PET scans can be used together with CT scans to show more detail.  Biopsy. A small sample of tissue is taken and checked under a microscope. If cancer cells are found, they may be tested to see if they contain the HER2 gene or the hormones estrogen and progesterone. This will help your caregiver decide how to treat the recurrent cancer. TREATMENT  How recurrent breast cancer is treated depends on where the new cancer is found. The type of treatment that was used for the first breast cancer makes a difference, too. A combination of treatments may be used. Options include:  Surgery.  If the cancer comes back in the breast that was not treated before, you may need a lumpectomy or mastectomy.  If the cancer comes back in the breast that was treated before, you may need a mastectomy.  The lymph nodes under the arm may need to be removed.  Radiation therapy.  For a local recurrence, radiation may be used if it was not used during the first treatment.  For a distance recurrence, radiation is sometimes used.  Chemotherapy.  This may be used before surgery to treat recurrent breast cancer.  This may be used to treat recurrent cancer that cannot be treated with surgery.  This may be used to treat a distant recurrence.  Hormone therapy.  Women with the HER2 gene may be given hormone therapy to attack this gene. Document Released: 05/07/2011 Document Revised: 12/01/2011 Document Reviewed: 05/07/2011 Exit[employer] Patient Information 2014 [employer].  

## 2013-10-06 NOTE — Telephone Encounter (Signed)
, °

## 2013-10-06 NOTE — Progress Notes (Signed)
ID: Tara [name]   DOB: Jun 22, 1946  MR#: [id number]  ATF#:[id number]  PCP: [name], MD    Principle Diagnosis: Stage II breast cancer, ER/PR negative status post lumpectomy 02/03/2007, status post FEC and Taxotere every 3 weeks completing chemotherapy 07/2007, status post radiation therapy completed 11/15/2007 on regular followup.    INTERVAL HISTORY:Tara [name] 68 y.o. female returns for a follow up appointment.  Overall patient is doing well. She has no evidence of recurrent disease. She continues to see her primary care physician. Her mammograms are up to date she will be due for next mammogram in May 2015. Patient denies any nausea vomiting fevers chills night sweats headaches shortness of breath chest pains palpitations no peripheral paresthesias no myalgias and arthralgias. Remainder of the 10 point review of systems is negative.    PAST MEDICAL HISTORY: Past Medical History  Diagnosis Date  . H/O: hysterectomy   . History of cancer chemotherapy   . History of radiation therapy   . Depression   . GERD (gastroesophageal reflux disease)   . 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 Hiatal hernia   . Hyperlipidemia   . Nonischemic cardiomyopathy   . Breast cancer 2008    right - s/p lumpectomy->chemo, radiation    PAST SURGICAL HISTORY: Past Surgical History  Procedure Laterality Date  . Total abdominal hysterectomy w/ bilateral salpingoophorectomy  1994  . Lumpectomy right breast  2008  . Cholecystectomy  1999  . Abdominal hysterectomy  1994    fibroids,   . Breast surgery Right 2008    lumpectomy, cancer    FAMILY HISTORY Family History  Problem Relation Age of Onset  . Colon cancer Father 46    died at age 19  . Cancer Father     colon   . Hypertension Sister   . Cancer Brother 67    prostate     HEALTH MAINTENANCE: History  Substance Use Topics  . Smoking status: Never Smoker   . Smokeless tobacco: Not on file   . Alcohol Use: No      No Known Allergies  Current Outpatient Prescriptions  Medication Sig Dispense Refill  . cholecalciferol (VITAMIN D) 1000 UNITS tablet Take 1,000 Units by mouth daily.      . clonazePAM (KLONOPIN) 1 MG tablet Take 1 mg by mouth at bedtime.       . lansoprazole (PREVACID) 30 MG capsule Take 1 capsule (30 mg total) by mouth daily. For acid reflux  90 capsule  1  . lisinopril (PRINIVIL,ZESTRIL) 5 MG tablet Take 1 tablet (5 mg total) by mouth daily.  30 tablet  6  . lovastatin (MEVACOR) 40 MG tablet Take 40 mg by mouth at bedtime.      . metoCLOPramide (REGLAN) 10 MG tablet Take 10 mg by mouth 2 (two) times daily.       . sertraline (ZOLOFT) 50 MG tablet Take 50 mg by mouth daily.       . verapamil (CALAN-SR) 120 MG CR tablet Take 1 tablet (120 mg total) by mouth daily.  90 tablet  3  . zolpidem (AMBIEN) 10 MG tablet Take 10 mg by mouth at bedtime as needed for sleep.        No current facility-administered medications for this visit.    OBJECTIVE: Filed Vitals:   10/06/13 1004  BP: 139/83  Pulse: 80  Temp: 98.5 F (36.9 C)  Resp: 20     Body  mass index is 33.44 kg/(m^2).    ECOG FS:   GENERAL: Well developed, well nourished, in no acute distress.  EENT: No ocular or oral lesions. No stomatitis.  RESPIRATORY: Lungs are clear to auscultation bilaterally, no wheezing, rhonchi or rales. CARDIAC: regular rate and rhythm, no murmurs, rubs or gallops. GI: Abdomen is soft, non tender,  no palpable hepatosplenomegaly. No fluid wave. Musculoskeletal:No focal spinal tenderness, no peripheral edema. Lymph: No cervical, infraclavicular, axillary or inguinal adenopathy. Neuro: No focal neurological deficits. Psych: Alert and oriented X 3, appropriate mood and affect.  BREAST EXAM:inspection negative, no nipple discharge or bleeding, no masses or nodularity palpable.    LAB RESULTS: Lab Results  Component Value Date   WBC 10.9* 10/06/2013   NEUTROABS 7.4*  10/06/2013   HGB 11.8 10/06/2013   HCT 36.2 10/06/2013   MCV 82.4 10/06/2013   PLT 219 10/06/2013      Chemistry      Component Value Date/Time   NA 145 10/06/2013 0955   NA 140 07/13/2013 1200   K 4.0 10/06/2013 0955   K 4.3 07/13/2013 1200   CL 103 07/13/2013 1200   CO2 27 10/06/2013 0955   CO2 28 07/13/2013 1200   BUN 17.9 10/06/2013 0955   BUN 19 07/13/2013 1200   CREATININE 1.1 10/06/2013 0955   CREATININE 1.1 07/13/2013 1200   CREATININE 0.88 07/26/2012 0825      Component Value Date/Time   CALCIUM 9.1 10/06/2013 0955   CALCIUM 9.4 07/13/2013 1200   ALKPHOS 107 10/06/2013 0955   ALKPHOS 101 08/02/2013 1039   AST 12 10/06/2013 0955   AST 12 08/02/2013 1039   ALT 10 10/06/2013 0955   ALT <8 08/02/2013 1039   BILITOT 0.33 10/06/2013 0955   BILITOT 0.4 08/02/2013 1039       Lab Results  Component Value Date   LABCA2 21 09/30/2011     STUDIES: No results found.  Assessment and plan: 68 year old female with history of stage II invasive ductal carcinoma of the breast originally diagnosed in 2008. She underwent a lumpectomy followed by adjuvant chemotherapy consisting of FEC and Taxotere. She completed all of her therapy November 2008. She then completed adjuvant radiation therapy 11/15/2007. She has been followed initially by Dr. Eston Esters. Since his departure she is now seeing me. Patient is without any evidence of recurrent disease.   She will continue to see [place] on a yearly basis.   Marcy Panning, MD Medical/Oncology [facility] [phone] (beeper) [phone] (Office)

## 2013-10-10 ENCOUNTER — Other Ambulatory Visit: Payer: Self-pay

## 2013-10-10 ENCOUNTER — Telehealth: Payer: Self-pay | Admitting: *Deleted

## 2013-10-10 MED ORDER — METOCLOPRAMIDE HCL 10 MG PO TABS: 10.0000 mg | ORAL_TABLET | Freq: Two times a day (BID) | ORAL | Status: DC

## 2013-10-10 NOTE — Telephone Encounter (Signed)
Pt is requesting a 90 day supply; Simpson pt.

## 2013-10-10 NOTE — Telephone Encounter (Signed)
Med refilled and sent to [facility] per patient request

## 2013-10-14 ENCOUNTER — Other Ambulatory Visit: Payer: Self-pay

## 2013-10-14 MED ORDER — LISINOPRIL 5 MG PO TABS: 5.0000 mg | ORAL_TABLET | Freq: Every day | ORAL | Status: DC

## 2013-10-26 ENCOUNTER — Encounter: Payer: Self-pay | Admitting: Internal Medicine

## 2013-10-26 ENCOUNTER — Ambulatory Visit (INDEPENDENT_AMBULATORY_CARE_PROVIDER_SITE_OTHER): Payer: Medicare Other | Admitting: Internal Medicine

## 2013-10-26 VITALS — BP 100/70 | HR 67 | Ht 59.5 in | Wt 168.8 lb

## 2013-10-26 DIAGNOSIS — I428 Other cardiomyopathies: Secondary | ICD-10-CM

## 2013-10-26 DIAGNOSIS — R5381 Other malaise: Secondary | ICD-10-CM

## 2013-10-26 DIAGNOSIS — I471 Supraventricular tachycardia: Secondary | ICD-10-CM

## 2013-10-26 DIAGNOSIS — E663 Overweight: Secondary | ICD-10-CM

## 2013-10-26 DIAGNOSIS — R5383 Other fatigue: Secondary | ICD-10-CM

## 2013-10-26 DIAGNOSIS — I498 Other specified cardiac arrhythmias: Secondary | ICD-10-CM

## 2013-10-26 NOTE — Patient Instructions (Signed)
Your physician wants you to follow-up in: 6 months with Kathrene Alu and 12 months with Dr Vallery Ridge will receive a reminder letter in the mail two months in advance. If you don't receive a letter, please call our office to schedule the follow-up appointment.

## 2013-10-26 NOTE — Progress Notes (Signed)
PCP:  [name], MD  The patient presents today for routine electrophysiology followup.  Since her last visit, the patient reports doing very well  She is unaware of sustained arrhythmias.  Her energy is stable.  She did not have her sleep study or stress test as advised.  Today, she denies symptoms of chest pain, shortness of breath, orthopnea, PND, lower extremity edema, dizziness, presyncope, syncope, or neurologic sequela.  The patient feels that she is tolerating medications without difficulties and is otherwise without complaint today.   Past Medical History  Diagnosis Date  . H/O: hysterectomy   . History of cancer chemotherapy   . History of radiation therapy   . Depression   . GERD (gastroesophageal reflux disease)   . 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 Hiatal hernia   . Hyperlipidemia   . Nonischemic cardiomyopathy   . Breast cancer 2008    right - s/p lumpectomy->chemo, radiation   Past Surgical History  Procedure Laterality Date  . Total abdominal hysterectomy w/ bilateral salpingoophorectomy  1994  . Lumpectomy right breast  2008  . Cholecystectomy  1999  . Abdominal hysterectomy  1994    fibroids,   . Breast surgery Right 2008    lumpectomy, cancer    Current Outpatient Prescriptions  Medication Sig Dispense Refill  . cholecalciferol (VITAMIN D) 1000 UNITS tablet Take 1,000 Units by mouth daily.      . clonazePAM (KLONOPIN) 1 MG tablet Take 1 mg by mouth at bedtime.       . lansoprazole (PREVACID) 30 MG capsule Take 1 capsule (30 mg total) by mouth daily. For acid reflux  90 capsule  1  . lisinopril (PRINIVIL,ZESTRIL) 5 MG tablet Take 1 tablet (5 mg total) by mouth daily.  30 tablet  0  . lovastatin (MEVACOR) 40 MG tablet Take 40 mg by mouth at bedtime.      . metoCLOPramide (REGLAN) 10 MG tablet Take 1 tablet (10 mg total) by mouth 2 (two) times daily.  180 tablet  0  . sertraline (ZOLOFT) 50 MG tablet Take 50 mg by mouth  daily.       . verapamil (CALAN-SR) 120 MG CR tablet Take 1 tablet (120 mg total) by mouth daily.  90 tablet  3  . zolpidem (AMBIEN) 10 MG tablet Take 10 mg by mouth at bedtime as needed for sleep.        No current facility-administered medications for this visit.    No Known Allergies  History   Social History  . Marital Status: Married    Spouse Name: N/A    Number of Children: 2  . Years of Education: N/A   Occupational History  . disabled     Social History Main Topics  . Smoking status: Never Smoker   . Smokeless tobacco: Not on file  . Alcohol Use: No  . Drug Use: No  . Sexual Activity: Yes   Other Topics Concern  . Not on file   Social History Narrative   Lives in [place] with family.  Does not routinely exercise.    Family History  Problem Relation Age of Onset  . Colon cancer Father 57    died at age 75  . Cancer Father     colon   . Hypertension Sister   . Cancer Brother 30    prostate    ROS-  All systems are reviewed and are negative except as outlined in  the HPI above  Physical Exam: Filed Vitals:   10/26/13 1011  BP: 100/70  Pulse: 67  Height: 4' 11.5" (1.511 m)  Weight: 168 lb 12.8 oz (76.567 kg)    GEN- The patient is well appearing, alert and oriented x 3 today.   Head- normocephalic, atraumatic Eyes-  Sclera clear, conjunctiva pink Ears- hearing intact Oropharynx- clear Neck- supple, no JVP Lymph- no cervical lymphadenopathy Lungs- Clear to ausculation bilaterally, normal work of breathing Heart- Regular rate and rhythm, no murmurs, rubs or gallops, PMI not laterally displaced GI- soft, NT, ND, + BS Extremities- no clubbing, cyanosis, or edema MS- no significant deformity or atrophy Skin- no rash or lesion Psych- euthymic mood, full affect Neuro- strength and sensation are intact  Echo today is reviewed   Assessment and Plan:  1. SVT- short RP SVT previously documented She reports that this is controlled with  verapamil.  She is clear that she is not interested in ablation at this time  2. Nonischemic CM-  Repeat echo reveals EF 45% She declines myoview or further workup Continue current medicine therapy She does not wish to try a beta blocker due to chronic issues with fatigue and daytime somnolence  3. Fatigue Sleep study was again recommended today.  She declines.  Return to see Cecille Rubin in 6 months I will see in 12 months

## 2013-11-09 ENCOUNTER — Other Ambulatory Visit: Payer: Self-pay | Admitting: Internal Medicine

## 2013-12-13 ENCOUNTER — Other Ambulatory Visit: Payer: Self-pay

## 2013-12-13 ENCOUNTER — Telehealth: Payer: Self-pay | Admitting: *Deleted

## 2013-12-13 MED ORDER — LOVASTATIN 40 MG PO TABS: 40.0000 mg | ORAL_TABLET | Freq: Every day | ORAL | Status: DC

## 2013-12-13 NOTE — Telephone Encounter (Signed)
Sent 90 day supply to [facility]

## 2013-12-13 NOTE — Telephone Encounter (Signed)
Received fax requesting lovastatin refill with #90 day supply.   Dr. Moshe Cipro patient.

## 2014-01-13 ENCOUNTER — Other Ambulatory Visit: Payer: Self-pay | Admitting: Family Medicine

## 2014-01-13 ENCOUNTER — Other Ambulatory Visit: Payer: Self-pay | Admitting: Adult Health

## 2014-01-13 DIAGNOSIS — Z853 Personal history of malignant neoplasm of breast: Secondary | ICD-10-CM

## 2014-01-30 ENCOUNTER — Other Ambulatory Visit: Payer: Self-pay | Admitting: Family Medicine

## 2014-01-30 ENCOUNTER — Other Ambulatory Visit: Payer: Self-pay

## 2014-01-30 DIAGNOSIS — Z853 Personal history of malignant neoplasm of breast: Secondary | ICD-10-CM

## 2014-01-31 ENCOUNTER — Encounter: Payer: Self-pay | Admitting: Family Medicine

## 2014-01-31 ENCOUNTER — Encounter (INDEPENDENT_AMBULATORY_CARE_PROVIDER_SITE_OTHER): Payer: Self-pay

## 2014-01-31 ENCOUNTER — Ambulatory Visit (INDEPENDENT_AMBULATORY_CARE_PROVIDER_SITE_OTHER): Payer: Medicare Other | Admitting: Family Medicine

## 2014-01-31 VITALS — BP 112/70 | HR 96 | Resp 16 | Wt 170.8 lb

## 2014-01-31 DIAGNOSIS — R5383 Other fatigue: Secondary | ICD-10-CM

## 2014-01-31 DIAGNOSIS — I471 Supraventricular tachycardia, unspecified: Secondary | ICD-10-CM

## 2014-01-31 DIAGNOSIS — C50919 Malignant neoplasm of unspecified site of unspecified female breast: Secondary | ICD-10-CM

## 2014-01-31 DIAGNOSIS — I498 Other specified cardiac arrhythmias: Secondary | ICD-10-CM

## 2014-01-31 DIAGNOSIS — E663 Overweight: Secondary | ICD-10-CM

## 2014-01-31 DIAGNOSIS — R5381 Other malaise: Secondary | ICD-10-CM

## 2014-01-31 DIAGNOSIS — F411 Generalized anxiety disorder: Secondary | ICD-10-CM

## 2014-01-31 DIAGNOSIS — I428 Other cardiomyopathies: Secondary | ICD-10-CM

## 2014-01-31 DIAGNOSIS — K219 Gastro-esophageal reflux disease without esophagitis: Secondary | ICD-10-CM

## 2014-01-31 DIAGNOSIS — E8881 Metabolic syndrome: Secondary | ICD-10-CM

## 2014-01-31 DIAGNOSIS — F329 Major depressive disorder, single episode, unspecified: Secondary | ICD-10-CM

## 2014-01-31 DIAGNOSIS — F3289 Other specified depressive episodes: Secondary | ICD-10-CM

## 2014-01-31 DIAGNOSIS — E785 Hyperlipidemia, unspecified: Secondary | ICD-10-CM

## 2014-01-31 DIAGNOSIS — R7301 Impaired fasting glucose: Secondary | ICD-10-CM

## 2014-01-31 DIAGNOSIS — Z13820 Encounter for screening for osteoporosis: Secondary | ICD-10-CM

## 2014-01-31 DIAGNOSIS — R7309 Other abnormal glucose: Secondary | ICD-10-CM

## 2014-01-31 DIAGNOSIS — R7303 Prediabetes: Secondary | ICD-10-CM

## 2014-01-31 DIAGNOSIS — E559 Vitamin D deficiency, unspecified: Secondary | ICD-10-CM

## 2014-01-31 LAB — COMPREHENSIVE METABOLIC PANEL
ALT: 8 U/L (ref 0–35)
AST: 14 U/L (ref 0–37)
Albumin: 4 g/dL (ref 3.5–5.2)
Alkaline Phosphatase: 101 U/L (ref 39–117)
BILIRUBIN TOTAL: 0.4 mg/dL (ref 0.2–1.2)
BUN: 14 mg/dL (ref 6–23)
CALCIUM: 9.3 mg/dL (ref 8.4–10.5)
CHLORIDE: 105 meq/L (ref 96–112)
CO2: 28 mEq/L (ref 19–32)
CREATININE: 0.97 mg/dL (ref 0.50–1.10)
Glucose, Bld: 90 mg/dL (ref 70–99)
Potassium: 4 mEq/L (ref 3.5–5.3)
Sodium: 139 mEq/L (ref 135–145)
Total Protein: 6.7 g/dL (ref 6.0–8.3)

## 2014-01-31 LAB — CBC WITH DIFFERENTIAL/PLATELET
BASOS PCT: 0 % (ref 0–1)
Basophils Absolute: 0 10*3/uL (ref 0.0–0.1)
Eosinophils Absolute: 0.6 10*3/uL (ref 0.0–0.7)
Eosinophils Relative: 6 % — ABNORMAL HIGH (ref 0–5)
HEMATOCRIT: 35.9 % — AB (ref 36.0–46.0)
HEMOGLOBIN: 11.6 g/dL — AB (ref 12.0–15.0)
LYMPHS ABS: 2.4 10*3/uL (ref 0.7–4.0)
LYMPHS PCT: 24 % (ref 12–46)
MCH: 26.2 pg (ref 26.0–34.0)
MCHC: 32.3 g/dL (ref 30.0–36.0)
MCV: 81 fL (ref 78.0–100.0)
MONO ABS: 0.6 10*3/uL (ref 0.1–1.0)
MONOS PCT: 6 % (ref 3–12)
NEUTROS ABS: 6.5 10*3/uL (ref 1.7–7.7)
NEUTROS PCT: 64 % (ref 43–77)
Platelets: 244 10*3/uL (ref 150–400)
RBC: 4.43 MIL/uL (ref 3.87–5.11)
RDW: 15.6 % — ABNORMAL HIGH (ref 11.5–15.5)
WBC: 10.2 10*3/uL (ref 4.0–10.5)

## 2014-01-31 LAB — LIPID PANEL
CHOLESTEROL: 169 mg/dL (ref 0–200)
HDL: 53 mg/dL (ref >39)
LDL Cholesterol: 100 mg/dL — ABNORMAL HIGH (ref 0–99)
TRIGLYCERIDES: 81 mg/dL (ref <150)
Total CHOL/HDL Ratio: 3.2 Ratio
VLDL: 16 mg/dL (ref 0–40)

## 2014-01-31 LAB — HEMOGLOBIN A1C
Hgb A1c MFr Bld: 5.9 % — ABNORMAL HIGH
Mean Plasma Glucose: 123 mg/dL — ABNORMAL HIGH

## 2014-01-31 MED ORDER — LANSOPRAZOLE 30 MG PO CPDR
30.0000 mg | DELAYED_RELEASE_CAPSULE | Freq: Every day | ORAL | Status: DC
Start: 2014-01-31 — End: 2014-06-21

## 2014-01-31 MED ORDER — LOVASTATIN 40 MG PO TABS: 40.0000 mg | ORAL_TABLET | Freq: Every day | ORAL | Status: DC

## 2014-01-31 NOTE — Patient Instructions (Addendum)
Annual exam in Nov 12, call if you need me before   CBc, lipid, cmp, TSh , hBA1C, vit D today  You are referred for dexa  Please reschedule your stress test, pls call in  pls schedule eye exam  I am happy you are getting your colonoscopy soon

## 2014-02-01 LAB — VITAMIN D 25 HYDROXY (VIT D DEFICIENCY, FRACTURES): VIT D 25 HYDROXY: 45 ng/mL (ref 30–89)

## 2014-02-01 LAB — TSH: TSH: 3.392 u[IU]/mL (ref 0.350–4.500)

## 2014-02-02 ENCOUNTER — Ambulatory Visit
Admission: RE | Admit: 2014-02-02 | Discharge: 2014-02-02 | Disposition: A | Discharge status: Home / Self Care | Payer: Medicare Other | Source: Ambulatory Visit | Attending: Family Medicine | Admitting: Family Medicine

## 2014-02-02 DIAGNOSIS — Z853 Personal history of malignant neoplasm of breast: Secondary | ICD-10-CM

## 2014-02-05 DIAGNOSIS — R7303 Prediabetes: Secondary | ICD-10-CM | POA: Insufficient documentation

## 2014-02-05 DIAGNOSIS — E8881 Metabolic syndrome: Secondary | ICD-10-CM | POA: Insufficient documentation

## 2014-02-05 NOTE — Assessment & Plan Note (Signed)
Surveillance only at this time, she has completed all treatment for her disease

## 2014-02-05 NOTE — Progress Notes (Signed)
Subjective:    Patient ID: Tara [name], female    DOB: Jun 02, 1946, 68 y.o.   MRN: [id number]  HPI The PT is here for follow up and re-evaluation of chronic medical conditions, medication management and review of any available recent lab and radiology data.  Preventive health is updated, specifically  Cancer screening and Immunization.  Has appointment set for her colonoscopy in the near future, and agrees to have dexa done also, this will be scheduled Questions or concerns regarding consultations or procedures which the PT has had in the interim are  addressed. The PT denies any adverse reactions to current medications since the last visit.  There are no new concerns.  There are no specific complaints  Still no commitment to regular physical activity or chsnge in diet, unfortunately she has gained weight since last visit      Review of Systems See HPI Denies recent fever or chills. Denies sinus pressure, nasal congestion, ear pain or sore throat. Denies chest congestion, productive cough or wheezing. Denies chest pains, palpitations and leg swelling Denies abdominal pain, nausea, vomiting,diarrhea or constipation.   Denies dysuria, frequency, hesitancy or incontinence. Denies joint pain, swelling and limitation in mobility. Denies headaches, seizures, numbness, or tingling. Denies uncontrolled  depression, anxiety or insomnia. Denies skin break down or rash.         Objective:   Physical Exam BP 112/70  Pulse 96  Resp 16  Wt 170 lb 12.8 oz (77.474 kg)  SpO2 95% Patient alert and oriented and in no cardiopulmonary distress.  HEENT: No facial asymmetry, EOMI, no sinus tenderness,  oropharynx pink and moist.  Neck supple no adenopathy.  Chest: Clear to auscultation bilaterally.  CVS: S1, S2 no murmurs, no S3.  ABD: Soft non tender. Bowel sounds normal.  Ext: No edema  MS: Adequate ROM spine, shoulders, hips and knees.  Skin: Intact, no ulcerations or rash  noted.  Psych: Good eye contact, normal affect. Memory intact not anxious or depressed appearing.  CNS: CN 2-12 intact, power, tone and sensation normal throughout.        Assessment & Plan:  HYPERLIPIDEMIA LDL elevated with weight gain and IGT, pt counseled re the need to reduce fried and fatty foods to improve this and reduce risk of CAD Hyperlipidemia:Low fat diet discussed and encouraged.  No med change  Updated lab needed at/ before next visit.   OVERWEIGHT Deteriorated. Patient re-educated about  the importance of commitment to a  minimum of 150 minutes of exercise per week. The importance of healthy food choices with portion control discussed. Encouraged to start a food diary, count calories and to consider  joining a support group. Sample diet sheets offered. Goals set by the patient for the next several months.     DEPRESSION Stable , managed by psychiatry, not suicidal or homicidal, no hallucinations  ANXIETY, CHRONIC Stable and controlled, managemnt per psychiatry  GERD Controlled, no change in medication   SVT (supraventricular tachycardia) Rate controlled on current meds, managed by psychiatry and she is up to date with cardiology eval, medication as before  Nonischemic cardiomyopathy Stable , no symptoms or signs of CV decompensation Importance of sodium restriction, regular exercise and weight loss is stressed  NEOPLASM, MALIGNANT, BREAST, RIGHT Surveillance only at this time, she has completed all treatment for her disease  Prediabetes Patient educated about the importance of limiting  Carbohydrate intake , the need to commit to daily physical activity for a minimum of 30 minutes ,  and to commit weight loss. The fact that changes in all these areas will reduce or eliminate all together the development of diabetes is stressed.     Metabolic syndrome X The increased risk of cardiovascular disease associated with this diagnosis, and the need to  consistently work on lifestyle to change this is discussed. Following  a  heart healthy diet ,commitment to 30 minutes of exercise at least 5 days per week, as well as control of blood sugar and cholesterol , and achieving a healthy weight are all the areas to be addressed .

## 2014-02-05 NOTE — Assessment & Plan Note (Signed)
The increased risk of cardiovascular disease associated with this diagnosis, and the need to consistently work on lifestyle to change this is discussed. Following  a  heart healthy diet ,commitment to 30 minutes of exercise at least 5 days per week, as well as control of blood sugar and cholesterol , and achieving a healthy weight are all the areas to be addressed .  

## 2014-02-05 NOTE — Assessment & Plan Note (Signed)
Stable , no symptoms or signs of CV decompensation Importance of sodium restriction, regular exercise and weight loss is stressed

## 2014-02-05 NOTE — Assessment & Plan Note (Signed)
Stable and controlled, managemnt per psychiatry

## 2014-02-05 NOTE — Assessment & Plan Note (Signed)
Deteriorated. Patient re-educated about  the importance of commitment to a  minimum of 150 minutes of exercise per week. The importance of healthy food choices with portion control discussed. Encouraged to start a food diary, count calories and to consider  joining a support group. Sample diet sheets offered. Goals set by the patient for the next several months.    

## 2014-02-05 NOTE — Assessment & Plan Note (Signed)
LDL elevated with weight gain and IGT, pt counseled re the need to reduce fried and fatty foods to improve this and reduce risk of CAD Hyperlipidemia:Low fat diet discussed and encouraged.  No med change  Updated lab needed at/ before next visit.

## 2014-02-05 NOTE — Assessment & Plan Note (Signed)
Controlled, no change in medication  

## 2014-02-05 NOTE — Assessment & Plan Note (Signed)
Patient educated about the importance of limiting  Carbohydrate intake , the need to commit to daily physical activity for a minimum of 30 minutes , and to commit weight loss. The fact that changes in all these areas will reduce or eliminate all together the development of diabetes is stressed.    

## 2014-02-05 NOTE — Assessment & Plan Note (Signed)
Rate controlled on current meds, managed by psychiatry and she is up to date with cardiology eval, medication as before

## 2014-02-05 NOTE — Assessment & Plan Note (Signed)
Stable , managed by psychiatry, not suicidal or homicidal, no hallucinations

## 2014-02-14 ENCOUNTER — Other Ambulatory Visit: Payer: Self-pay

## 2014-02-14 ENCOUNTER — Ambulatory Visit (HOSPITAL_COMMUNITY)
Admission: RE | Admit: 2014-02-14 | Discharge: 2014-02-14 | Disposition: A | Discharge status: Home / Self Care | Payer: Medicare Other | Source: Ambulatory Visit | Attending: Family Medicine | Admitting: Family Medicine

## 2014-02-14 ENCOUNTER — Encounter: Payer: Self-pay | Admitting: Family Medicine

## 2014-02-14 DIAGNOSIS — M8580 Other specified disorders of bone density and structure, unspecified site: Secondary | ICD-10-CM | POA: Insufficient documentation

## 2014-02-14 DIAGNOSIS — M810 Age-related osteoporosis without current pathological fracture: Secondary | ICD-10-CM | POA: Insufficient documentation

## 2014-02-14 DIAGNOSIS — Z13820 Encounter for screening for osteoporosis: Secondary | ICD-10-CM | POA: Insufficient documentation

## 2014-02-14 MED ORDER — ALENDRONATE SODIUM 70 MG PO TABS: 70.0000 mg | ORAL_TABLET | ORAL | Status: DC

## 2014-02-20 ENCOUNTER — Ambulatory Visit: Payer: Medicare Other | Admitting: Gastroenterology

## 2014-02-22 ENCOUNTER — Other Ambulatory Visit: Payer: Self-pay

## 2014-02-22 ENCOUNTER — Telehealth: Payer: Self-pay

## 2014-02-22 DIAGNOSIS — Z1211 Encounter for screening for malignant neoplasm of colon: Secondary | ICD-10-CM

## 2014-02-22 NOTE — Telephone Encounter (Signed)
Gastroenterology Pre-Procedure Review  Request Date: Requesting Physician: Moshe Cipro  PATIENT REVIEW QUESTIONS: The patient responded to the following health history questions as indicated:    1. Diabetes Melitis: NO 2. Joint replacements in the past 12 months: NO 3. Major health problems in the past 3 months: NO 4. Has an artificial valve or MVP: NO 5. Has a defibrillator: NO 6. Has been advised in past to take antibiotics in advance of a procedure like teeth cleaning: NO 7.Family history colon cancer: Father 67. Acholic: NO     MEDICATIONS & ALLERGIES:    Patient reports the following regarding taking any blood thinners:   Plavix? NO Aspirin? NO Coumadin? NO  Patient confirms/reports the following medications:  Current Outpatient Prescriptions  Medication Sig Dispense Refill  . alendronate (FOSAMAX) 70 MG tablet Take 1 tablet (70 mg total) by mouth every 7 (seven) days. Take with a full glass of water on an empty stomach.  12 tablet  1  . cholecalciferol (VITAMIN D) 1000 UNITS tablet Take 1,000 Units by mouth daily.      . clonazePAM (KLONOPIN) 1 MG tablet Take 1 mg by mouth at bedtime.       . docusate sodium (COLACE) 100 MG capsule Take 100 mg by mouth 2 (two) times daily.      . lansoprazole (PREVACID) 30 MG capsule Take 1 capsule (30 mg total) by mouth daily. For acid reflux  90 capsule  1  . lisinopril (PRINIVIL,ZESTRIL) 5 MG tablet TAKE ONE TABLET BY MOUTH ONCE DAILY.  30 tablet  5  . lovastatin (MEVACOR) 40 MG tablet Take 1 tablet (40 mg total) by mouth at bedtime.  90 tablet  1  . metoCLOPramide (REGLAN) 10 MG tablet Take 1 tablet (10 mg total) by mouth 2 (two) times daily.  180 tablet  0  . sertraline (ZOLOFT) 50 MG tablet Take 50 mg by mouth daily.       . verapamil (CALAN-SR) 120 MG CR tablet Take 1 tablet (120 mg total) by mouth daily.  90 tablet  3  . zolpidem (AMBIEN) 10 MG tablet Take 10 mg by mouth at bedtime as needed for sleep.        No current  facility-administered medications for this visit.    Patient confirms/reports the following allergies:  Allergies  Allergen Reactions  . Aspirin Other (See Comments)    Reports GI bleed with daily use    No orders of the defined types were placed in this encounter.    AUTHORIZATION INFORMATION Primary Insurance: [employer]  ID #: [id number] Group #: [id number] Pre-Cert / Josem Kaufmann required: NO Pre-Cert / Auth #:   SCHEDULE INFORMATION: Procedure has been scheduled as follows:  Date:  Time:  Location:   This Gastroenterology Pre-Precedure Review Form is being routed to the following provider(s):  Fields patient Wants to be put to sleep

## 2014-02-22 NOTE — Telephone Encounter (Signed)
PLEASE CALL PT. SHE NEEDS TO BE SEEN PRIOR TO HER TCS BECAUSE SHE WILL NEED PROPOFOL DUE TO HER RX MEDS.

## 2014-02-22 NOTE — Telephone Encounter (Signed)
PT HAD FAMILY HX OF COLON CANCER / FATHER AGE 68  PT SAID SHE WANTS TO BE PUT TO SLEEP PER GINGER

## 2014-02-22 NOTE — Telephone Encounter (Signed)
LMOM to call back

## 2014-02-23 ENCOUNTER — Ambulatory Visit: Payer: Medicare Other | Admitting: Gastroenterology

## 2014-02-24 NOTE — Telephone Encounter (Signed)
I called pt and scheduled OV with Laban Emperor, NP on 03/30/2014 at 1:30 pm. She is aware the TCS on 03/13/2014 is cancelled until after her OV. Maudie Mercury is aware and has cancelled at the hospital.

## 2014-03-13 ENCOUNTER — Encounter (HOSPITAL_COMMUNITY): Admission: RE | Payer: Self-pay | Source: Ambulatory Visit

## 2014-03-13 ENCOUNTER — Ambulatory Visit (HOSPITAL_COMMUNITY): Admission: RE | Admit: 2014-03-13 | Payer: Medicare Other | Source: Ambulatory Visit | Admitting: Gastroenterology

## 2014-03-13 SURGERY — COLONOSCOPY
Anesthesia: Moderate Sedation

## 2014-03-30 ENCOUNTER — Encounter: Payer: Self-pay | Admitting: Gastroenterology

## 2014-03-30 ENCOUNTER — Other Ambulatory Visit: Payer: Self-pay | Admitting: Gastroenterology

## 2014-03-30 ENCOUNTER — Ambulatory Visit (INDEPENDENT_AMBULATORY_CARE_PROVIDER_SITE_OTHER): Payer: Medicare Other | Admitting: Gastroenterology

## 2014-03-30 ENCOUNTER — Encounter (INDEPENDENT_AMBULATORY_CARE_PROVIDER_SITE_OTHER): Payer: Self-pay

## 2014-03-30 VITALS — BP 110/70 | HR 78 | Temp 98.1°F | Ht <= 58 in | Wt 173.0 lb

## 2014-03-30 DIAGNOSIS — Z800 Family history of malignant neoplasm of digestive organs: Secondary | ICD-10-CM | POA: Insufficient documentation

## 2014-03-30 MED ORDER — LINACLOTIDE 145 MCG PO CAPS: 145.0000 ug | ORAL_CAPSULE | Freq: Every day | ORAL | Status: DC

## 2014-03-30 NOTE — Assessment & Plan Note (Signed)
68 year old female overdue for above average risk colonoscopy, with last performed in 2008. Multiple polyps noted but no path available at time of visit. Requested from medical records. No concerning lower GI symptoms. Chronic constipation noted; she is willing to try prescriptive agent.   Proceed with colonoscopy with Dr. Oneida Alar in the near future. The risks, benefits, and alternatives have been discussed in detail with the patient. They state understanding and desire to proceed.  Propofol due to polypharmacy Linzess 145 mcg daily

## 2014-03-30 NOTE — Patient Instructions (Signed)
For constipation: start taking Linzess 1 capsule each morning, 30 minutes before breakfast. I have provided a free month voucher for you.  We have scheduled you for a colonoscopy with Dr. Oneida Alar in the near future.

## 2014-03-30 NOTE — Progress Notes (Signed)
Primary Care Physician:  [name], MD Primary Gastroenterologist:  Dr.   Laurel Dimmer Complaint  Patient presents with  . Colonoscopy    HPI:   Tara [name] presents today to set up a surveillance colonoscopy. Last lower GI evaluation in 2008 with multiple polyps; path not available at time of visit. History of colon cancer in father, diagnosed  at age 68 and succumbed to the disease around age 18. She notes prune juice as helpful with intermittent constipation. Some days BMs do not feel as productive as others. History of hemorrhoids after birth of son. No rectal bleeding. No N/V. No reflux or dysphagia.  Past Medical History  Diagnosis Date  . H/O: hysterectomy   . History of cancer chemotherapy   . History of radiation therapy   . Depression   . GERD (gastroesophageal reflux disease)   . 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 Hiatal hernia   . Hyperlipidemia   . Nonischemic cardiomyopathy   . Breast cancer 2008    right - s/p lumpectomy->chemo, radiation    Past Surgical History  Procedure Laterality Date  . Total abdominal hysterectomy w/ bilateral salpingoophorectomy  1994  . Cholecystectomy  1999  . Abdominal hysterectomy  1994    fibroids,   . Breast surgery Right 2008    lumpectomy, cancer  . Colonoscopy  2008    Dr. Oneida Alar: multiple polyps. Path not available at time of visit.     Current Outpatient Prescriptions  Medication Sig Dispense Refill  . alendronate (FOSAMAX) 70 MG tablet Take 1 tablet (70 mg total) by mouth every 7 (seven) days. Take with a full glass of water on an empty stomach.  12 tablet  1  . cholecalciferol (VITAMIN D) 1000 UNITS tablet Take 1,000 Units by mouth daily.      . clonazePAM (KLONOPIN) 1 MG tablet Take 1 mg by mouth at bedtime.       . docusate sodium (COLACE) 100 MG capsule Take 100 mg by mouth 2 (two) times daily.      . lansoprazole (PREVACID) 30 MG capsule Take 1 capsule (30 mg total)  by mouth daily. For acid reflux  90 capsule  1  . lisinopril (PRINIVIL,ZESTRIL) 5 MG tablet TAKE ONE TABLET BY MOUTH ONCE DAILY.  30 tablet  5  . lovastatin (MEVACOR) 40 MG tablet Take 1 tablet (40 mg total) by mouth at bedtime.  90 tablet  1  . metoCLOPramide (REGLAN) 10 MG tablet Take 1 tablet (10 mg total) by mouth 2 (two) times daily.  180 tablet  0  . sertraline (ZOLOFT) 50 MG tablet Take 50 mg by mouth daily.       . verapamil (CALAN-SR) 120 MG CR tablet Take 1 tablet (120 mg total) by mouth daily.  90 tablet  3  . zolpidem (AMBIEN) 10 MG tablet Take 10 mg by mouth at bedtime as needed for sleep.       . Linaclotide (LINZESS) 145 MCG CAPS capsule Take 1 capsule (145 mcg total) by mouth daily. Take 30 minutes prior to breakfast  30 capsule  3   No current facility-administered medications for this visit.    Allergies as of 03/30/2014 - Review Complete 03/30/2014  Allergen Reaction Noted  . Aspirin Other (See Comments) 01/31/2014    Family History  Problem Relation Age of Onset  . Colon cancer Father 34    died at age 36  .  Cancer Father     colon   . Hypertension Sister   . Cancer Brother 72    prostate    History   Social History  . Marital Status: Married    Spouse Name: N/A    Number of Children: 2  . Years of Education: N/A   Occupational History  . disabled     Social History Main Topics  . Smoking status: Never Smoker   . Smokeless tobacco: Not on file  . Alcohol Use: No  . Drug Use: No  . Sexual Activity: Yes   Other Topics Concern  . Not on file   Social History Narrative   Lives in [place] with family.  Does not routinely exercise.    Review of Systems: Gen: Denies any fever, chills, fatigue, weight loss, lack of appetite.  CV: occasional palpitations with caffeine  Resp: +DOE GI: see HPI GU : Denies urinary burning, urinary frequency, urinary hesitancy MS: Denies joint pain, muscle weakness, cramps, or limitation of movement.  Derm: Denies  rash, itching, dry skin Psych: Denies depression, anxiety, memory loss, and confusion Heme: Denies bruising, bleeding, and enlarged lymph nodes.  Physical Exam: BP 110/70  Pulse 78  Temp(Src) 98.1 F (36.7 C) (Oral)  Ht 4\' 9"  (1.448 m)  Wt 173 lb (78.472 kg)  BMI 37.43 kg/m2 General:   Alert and oriented. Pleasant and cooperative. Well-nourished and well-developed.  Head:  Normocephalic and atraumatic. Eyes:  Without icterus, sclera clear and conjunctiva pink.  Ears:  Normal auditory acuity. Nose:  No deformity, discharge,  or lesions. Mouth:  No deformity or lesions, oral mucosa pink.  Lungs:  Clear to auscultation bilaterally. No wheezes, rales, or rhonchi. No distress.  Heart:  S1, S2 present without murmurs appreciated.  Abdomen:  +BS, soft, non-tender and non-distended. No HSM noted. No guarding or rebound. No masses appreciated.  Rectal:  Deferred  Msk:  Symmetrical without gross deformities. Normal posture. Extremities:  Without clubbing or edema. Neurologic:  Alert and  oriented x4;  grossly normal neurologically. Skin:  Intact without significant lesions or rashes. Cervical Nodes:  No significant cervical adenopathy. Psych:  Alert and cooperative. Normal mood and affect.

## 2014-04-03 NOTE — Progress Notes (Signed)
cc'd to pcp 

## 2014-04-11 ENCOUNTER — Encounter (HOSPITAL_COMMUNITY): Payer: Self-pay | Admitting: Pharmacy Technician

## 2014-04-18 ENCOUNTER — Telehealth: Payer: Self-pay | Admitting: *Deleted

## 2014-04-18 NOTE — Telephone Encounter (Signed)
Pt called stating Dr. Oneida Alar called her in linzess and she needs a PA and pt gave the number to call [phone] option 4.

## 2014-04-18 NOTE — Patient Instructions (Signed)
Tara [name]  04/18/2014   Your procedure is scheduled on:  04/25/2014  Report to [facility] at 9:00 AM.  Call this number if you have problems the morning of surgery: [phone]   Remember:   Do not eat food or drink liquids after midnight.   Take these medicines the morning of surgery with A SIP OF WATER: Fosamax,  Pravacid, Linzess, Lisinopril, Reglan, Zoloft, Verapamil   Do not wear jewelry, make-up or nail polish.  Do not wear lotions, powders, or perfumes. You may wear deodorant.  Do not shave 48 hours prior to surgery. Men may shave face and neck.  Do not bring valuables to the hospital.  [facility] is not responsible for any  belongings or valuables.               Contacts, dentures or bridgework may not be worn into surgery.  Leave suitcase in the car. After surgery it may be brought to your room.  For patients admitted to the hospital, discharge time is determined by your                 treatment team.               Patients discharged the day of surgery will not be allowed to drive  home.  Name and phone number of your driver:   Special Instructions: N/A   Please read over the following fact sheets that you were given: Anesthesia Post-op Instructions   PATIENT INSTRUCTIONS POST-ANESTHESIA  IMMEDIATELY FOLLOWING SURGERY:  Do not drive or operate machinery for the first twenty four hours after surgery.  Do not make any important decisions for twenty four hours after surgery or while taking narcotic pain medications or sedatives.  If you develop intractable nausea and vomiting or a severe headache please notify your doctor immediately.  FOLLOW-UP:  Please make an appointment with your surgeon as instructed. You do not need to follow up with anesthesia unless specifically instructed to do so.  WOUND CARE INSTRUCTIONS (if applicable):  Keep a dry clean dressing on the anesthesia/puncture wound site if there is drainage.  Once the wound has quit draining you may leave it  open to air.  Generally you should leave the bandage intact for twenty four hours unless there is drainage.  If the epidural site drains for more than 36-48 hours please call the anesthesia department.  QUESTIONS?:  Please feel free to call your physician or the hospital operator if you have any questions, and they will be happy to assist you.      Colonoscopy A colonoscopy is an exam to look at the entire large intestine (colon). This exam can help find problems such as tumors, polyps, inflammation, and areas of bleeding. The exam takes about 1 hour.  LET [facility] CARE PROVIDER KNOW ABOUT:   Any allergies you have.  All medicines you are taking, including vitamins, herbs, eye drops, creams, and over-the-counter medicines.  Previous problems you or members of your family have had with the use of anesthetics.  Any blood disorders you have.  Previous surgeries you have had.  Medical conditions you have. RISKS AND COMPLICATIONS  Generally, this is a safe procedure. However, as with any procedure, complications can occur. Possible complications include:  Bleeding.  Tearing or rupture of the colon wall.  Reaction to medicines given during the exam.  Infection (rare). BEFORE THE PROCEDURE   Ask your health care provider about changing or stopping your regular medicines.  You may be prescribed an Tara bowel prep. This involves drinking a large amount of medicated liquid, starting the day before your procedure. The liquid will cause you to have multiple loose stools until your stool is almost clear or light green. This cleans out your colon in preparation for the procedure.  Do not eat or drink anything else once you have started the bowel prep, unless your health care provider tells you it is safe to do so.  Arrange for someone to drive you home after the procedure. PROCEDURE   You will be given medicine to help you relax (sedative).  You will lie on your side with your knees  bent.  A long, flexible tube with a light and camera on the end (colonoscope) will be inserted through the rectum and into the colon. The camera sends video back to a computer screen as it moves through the colon. The colonoscope also releases carbon dioxide gas to inflate the colon. This helps your health care provider see the area better.  During the exam, your health care provider may take a small tissue sample (biopsy) to be examined under a microscope if any abnormalities are found.  The exam is finished when the entire colon has been viewed. AFTER THE PROCEDURE   Do not drive for 24 hours after the exam.  You may have a small amount of blood in your stool.  You may pass moderate amounts of gas and have mild abdominal cramping or bloating. This is caused by the gas used to inflate your colon during the exam.  Ask when your test results will be ready and how you will get your results. Make sure you get your test results. Document Released: 09/05/2000 Document Revised: 06/29/2013 Document Reviewed: 05/16/2013 [facility] Patient Information 2015 [place], [place]. This information is not intended to replace advice given to you by your health care provider. Make sure you discuss any questions you have with your health care provider.

## 2014-04-18 NOTE — Telephone Encounter (Signed)
I called pt and told her I will check with Almyra Free to see if she is working on a PA.

## 2014-04-19 ENCOUNTER — Encounter (HOSPITAL_COMMUNITY): Payer: Self-pay

## 2014-04-19 ENCOUNTER — Encounter (HOSPITAL_COMMUNITY)
Admission: RE | Admit: 2014-04-19 | Discharge: 2014-04-19 | Disposition: A | Discharge status: Home / Self Care | Payer: Medicare Other | Source: Ambulatory Visit | Attending: Gastroenterology | Admitting: Gastroenterology

## 2014-04-19 DIAGNOSIS — Z01818 Encounter for other preprocedural examination: Secondary | ICD-10-CM | POA: Diagnosis not present

## 2014-04-19 DIAGNOSIS — Z01812 Encounter for preprocedural laboratory examination: Secondary | ICD-10-CM | POA: Insufficient documentation

## 2014-04-19 HISTORY — DX: Essential (primary) hypertension: I10

## 2014-04-19 HISTORY — DX: Cardiac arrhythmia, unspecified: I49.9

## 2014-04-19 HISTORY — DX: Anxiety disorder, unspecified: F41.9

## 2014-04-19 LAB — BASIC METABOLIC PANEL
Anion gap: 11 (ref 5–15)
BUN: 14 mg/dL (ref 6–23)
CO2: 27 mEq/L (ref 19–32)
Calcium: 9.1 mg/dL (ref 8.4–10.5)
Chloride: 104 mEq/L (ref 96–112)
Creatinine, Ser: 1.11 mg/dL — ABNORMAL HIGH (ref 0.50–1.10)
GFR, EST AFRICAN AMERICAN: 58 mL/min — AB (ref >90)
GFR, EST NON AFRICAN AMERICAN: 50 mL/min — AB (ref >90)
GLUCOSE: 84 mg/dL (ref 70–99)
POTASSIUM: 4.7 meq/L (ref 3.7–5.3)
Sodium: 142 mEq/L (ref 137–147)

## 2014-04-19 LAB — HEMOGLOBIN AND HEMATOCRIT, BLOOD
HCT: 36.6 % (ref 36.0–46.0)
HEMOGLOBIN: 12 g/dL (ref 12.0–15.0)

## 2014-04-19 NOTE — Progress Notes (Signed)
REVIEWED.  

## 2014-04-19 NOTE — Telephone Encounter (Signed)
Patient called wanting to know about her linzess, I made pt aware Tara [name] is working on a PA. Pt understood.

## 2014-04-19 NOTE — Pre-Procedure Instructions (Signed)
Patient given information to sign up for my chart at home. 

## 2014-04-19 NOTE — Telephone Encounter (Signed)
Working on PA

## 2014-04-25 ENCOUNTER — Encounter (HOSPITAL_COMMUNITY): Payer: Medicare Other | Admitting: Anesthesiology

## 2014-04-25 ENCOUNTER — Ambulatory Visit (HOSPITAL_COMMUNITY)
Admission: RE | Admit: 2014-04-25 | Discharge: 2014-04-25 | Disposition: A | Discharge status: Home / Self Care | Payer: Medicare Other | Source: Ambulatory Visit | Attending: Gastroenterology | Admitting: Gastroenterology

## 2014-04-25 ENCOUNTER — Encounter (HOSPITAL_COMMUNITY)
Admission: RE | Disposition: A | Discharge status: Home / Self Care | Payer: Self-pay | Source: Ambulatory Visit | Attending: Gastroenterology

## 2014-04-25 ENCOUNTER — Encounter (HOSPITAL_COMMUNITY): Payer: Self-pay | Admitting: *Deleted

## 2014-04-25 ENCOUNTER — Ambulatory Visit (HOSPITAL_COMMUNITY): Payer: Medicare Other | Admitting: Anesthesiology

## 2014-04-25 DIAGNOSIS — K5730 Diverticulosis of large intestine without perforation or abscess without bleeding: Secondary | ICD-10-CM | POA: Insufficient documentation

## 2014-04-25 DIAGNOSIS — I10 Essential (primary) hypertension: Secondary | ICD-10-CM | POA: Insufficient documentation

## 2014-04-25 DIAGNOSIS — Z86010 Personal history of colonic polyps: Secondary | ICD-10-CM | POA: Diagnosis present

## 2014-04-25 DIAGNOSIS — Z853 Personal history of malignant neoplasm of breast: Secondary | ICD-10-CM | POA: Insufficient documentation

## 2014-04-25 DIAGNOSIS — E785 Hyperlipidemia, unspecified: Secondary | ICD-10-CM | POA: Insufficient documentation

## 2014-04-25 DIAGNOSIS — Z800 Family history of malignant neoplasm of digestive organs: Secondary | ICD-10-CM | POA: Diagnosis not present

## 2014-04-25 DIAGNOSIS — F411 Generalized anxiety disorder: Secondary | ICD-10-CM | POA: Diagnosis not present

## 2014-04-25 DIAGNOSIS — D128 Benign neoplasm of rectum: Secondary | ICD-10-CM | POA: Insufficient documentation

## 2014-04-25 DIAGNOSIS — Z9221 Personal history of antineoplastic chemotherapy: Secondary | ICD-10-CM | POA: Insufficient documentation

## 2014-04-25 DIAGNOSIS — Z923 Personal history of irradiation: Secondary | ICD-10-CM | POA: Insufficient documentation

## 2014-04-25 DIAGNOSIS — K219 Gastro-esophageal reflux disease without esophagitis: Secondary | ICD-10-CM | POA: Insufficient documentation

## 2014-04-25 DIAGNOSIS — D129 Benign neoplasm of anus and anal canal: Secondary | ICD-10-CM | POA: Diagnosis not present

## 2014-04-25 DIAGNOSIS — D126 Benign neoplasm of colon, unspecified: Secondary | ICD-10-CM | POA: Diagnosis not present

## 2014-04-25 DIAGNOSIS — K648 Other hemorrhoids: Secondary | ICD-10-CM | POA: Diagnosis not present

## 2014-04-25 DIAGNOSIS — Z79899 Other long term (current) drug therapy: Secondary | ICD-10-CM | POA: Diagnosis not present

## 2014-04-25 HISTORY — PX: COLONOSCOPY WITH PROPOFOL: SHX5780

## 2014-04-25 HISTORY — PX: POLYPECTOMY: SHX5525

## 2014-04-25 SURGERY — COLONOSCOPY WITH PROPOFOL
Anesthesia: Monitor Anesthesia Care

## 2014-04-25 MED ORDER — GLYCOPYRROLATE 0.2 MG/ML IJ SOLN
INTRAMUSCULAR | Status: AC
  Filled 2014-04-25: qty 1

## 2014-04-25 MED ORDER — FENTANYL CITRATE 0.05 MG/ML IJ SOLN
25.0000 ug | INTRAMUSCULAR | Status: AC
  Administered 2014-04-25 (×2): 25 ug via INTRAVENOUS

## 2014-04-25 MED ORDER — PROPOFOL INFUSION 10 MG/ML OPTIME
INTRAVENOUS | Status: DC | PRN
  Administered 2014-04-25: 12:00:00 via INTRAVENOUS
  Administered 2014-04-25: 100 ug/kg/min via INTRAVENOUS

## 2014-04-25 MED ORDER — STERILE WATER FOR IRRIGATION IR SOLN
Status: DC | PRN
  Administered 2014-04-25: 11:00:00

## 2014-04-25 MED ORDER — LIDOCAINE HCL (PF) 1 % IJ SOLN
INTRAMUSCULAR | Status: AC
  Filled 2014-04-25: qty 5

## 2014-04-25 MED ORDER — ONDANSETRON HCL 4 MG/2ML IJ SOLN
INTRAMUSCULAR | Status: AC
  Filled 2014-04-25: qty 2

## 2014-04-25 MED ORDER — LIDOCAINE HCL (CARDIAC) 20 MG/ML IV SOLN
INTRAVENOUS | Status: DC | PRN
  Administered 2014-04-25: 10 mg via INTRAVENOUS

## 2014-04-25 MED ORDER — LACTATED RINGERS IV SOLN
INTRAVENOUS | Status: DC
  Administered 2014-04-25: 10:00:00 via INTRAVENOUS

## 2014-04-25 MED ORDER — FENTANYL CITRATE 0.05 MG/ML IJ SOLN: 25.0000 ug | INTRAMUSCULAR | Status: DC | PRN

## 2014-04-25 MED ORDER — PROPOFOL 10 MG/ML IV EMUL
INTRAVENOUS | Status: AC
  Filled 2014-04-25: qty 20

## 2014-04-25 MED ORDER — ONDANSETRON HCL 4 MG/2ML IJ SOLN: 4.0000 mg | Freq: Once | INTRAMUSCULAR | Status: DC | PRN

## 2014-04-25 MED ORDER — MIDAZOLAM HCL 2 MG/2ML IJ SOLN
INTRAMUSCULAR | Status: AC
  Filled 2014-04-25: qty 2

## 2014-04-25 MED ORDER — FENTANYL CITRATE 0.05 MG/ML IJ SOLN
INTRAMUSCULAR | Status: AC
  Filled 2014-04-25: qty 2

## 2014-04-25 MED ORDER — MIDAZOLAM HCL 2 MG/2ML IJ SOLN
1.0000 mg | INTRAMUSCULAR | Status: DC | PRN
  Administered 2014-04-25: 2 mg via INTRAVENOUS

## 2014-04-25 MED ORDER — PROPOFOL 10 MG/ML IV BOLUS
INTRAVENOUS | Status: AC
  Filled 2014-04-25: qty 20

## 2014-04-25 SURGICAL SUPPLY — 23 items
ELECT REM PT RETURN 9FT ADLT (ELECTROSURGICAL)
ELECTRODE REM PT RTRN 9FT ADLT (ELECTROSURGICAL) IMPLANT
FCP BXJMBJMB 240X2.8X (CUTTING FORCEPS)
FLOOR PAD 36X40 (MISCELLANEOUS) ×2
FORCEPS BIOP RAD 4 LRG CAP 4 (CUTTING FORCEPS) ×1 IMPLANT
FORCEPS BIOP RJ4 240 W/NDL (CUTTING FORCEPS)
FORCEPS BXJMBJMB 240X2.8X (CUTTING FORCEPS) IMPLANT
FORMALIN 10 PREFIL 20ML (MISCELLANEOUS) IMPLANT
INJECTOR/SNARE I SNARE (MISCELLANEOUS) IMPLANT
KIT CLEAN ENDO COMPLIANCE (KITS) ×2 IMPLANT
LUBRICANT JELLY 4.5OZ STERILE (MISCELLANEOUS) ×1 IMPLANT
MANIFOLD NEPTUNE II (INSTRUMENTS) ×1 IMPLANT
NDL SCLEROTHERAPY 25GX240 (NEEDLE) IMPLANT
NEEDLE SCLEROTHERAPY 25GX240 (NEEDLE) IMPLANT
PAD FLOOR 36X40 (MISCELLANEOUS) ×1 IMPLANT
PROBE APC STR FIRE (PROBE) IMPLANT
PROBE INJECTION GOLD (MISCELLANEOUS)
PROBE INJECTION GOLD 7FR (MISCELLANEOUS) IMPLANT
SNARE SHORT THROW 13M SML OVAL (MISCELLANEOUS) ×2 IMPLANT
SYR 50ML LL SCALE MARK (SYRINGE) ×1 IMPLANT
TRAP SPECIMEN MUCOUS 40CC (MISCELLANEOUS) ×1 IMPLANT
TUBING IRRIGATION ENDOGATOR (MISCELLANEOUS) ×1 IMPLANT
WATER STERILE IRR 1000ML POUR (IV SOLUTION) ×1 IMPLANT

## 2014-04-25 NOTE — Anesthesia Preprocedure Evaluation (Signed)
Anesthesia Evaluation  Patient identified by MRN, date of birth, ID band Patient awake    Reviewed: Allergy & Precautions, H&P , NPO status , Patient's Chart, lab work & pertinent test results  Airway Mallampati: III TM Distance: >3 FB   Mouth opening: Limited Mouth Opening  Dental  (+) Teeth Intact   Pulmonary  breath sounds clear to auscultation        Cardiovascular hypertension, Pt. on medications + dysrhythmias Supra Ventricular Tachycardia Rhythm:Regular Rate:Normal     Neuro/Psych PSYCHIATRIC DISORDERS Anxiety Depression    GI/Hepatic hiatal hernia, GERD-  Medicated and Controlled,  Endo/Other    Renal/GU      Musculoskeletal   Abdominal   Peds  Hematology   Anesthesia Other Findings   Reproductive/Obstetrics                           Anesthesia Physical Anesthesia Plan  ASA: III  Anesthesia Plan: MAC   Post-op Pain Management:    Induction: Intravenous  Airway Management Planned: Simple Face Mask  Additional Equipment:   Intra-op Plan:   Post-operative Plan:   Informed Consent: I have reviewed the patients History and Physical, chart, labs and discussed the procedure including the risks, benefits and alternatives for the proposed anesthesia with the patient or authorized representative who has indicated his/her understanding and acceptance.     Plan Discussed with:   Anesthesia Plan Comments:         Anesthesia Quick Evaluation

## 2014-04-25 NOTE — Anesthesia Postprocedure Evaluation (Signed)
  Anesthesia Post-op Note  Patient: Tara [name]  Procedure(s) Performed: Procedure(s) with comments: COLONOSCOPY WITH PROPOFOL (N/A) - Cecum time in 1132     time out 1203 total time 31 minutes POLYPECTOMY (N/A) - Ascending and Decending Colon x3 , Transverse colon x2, rectal  Patient Location: PACU  Anesthesia Type:MAC  Level of Consciousness: awake, alert  and oriented  Airway and Oxygen Therapy: Patient Spontanous Breathing and Patient connected to face mask oxygen  Post-op Pain: none  Post-op Assessment: Post-op Vital signs reviewed, Patient's Cardiovascular Status Stable, Respiratory Function Stable, Patent Airway and No signs of Nausea or vomiting  Post-op Vital Signs: Reviewed and stable  Last Vitals:  Filed Vitals:   04/25/14 1110  BP:   Pulse:   Temp:   Resp: 13    Complications: No apparent anesthesia complications

## 2014-04-25 NOTE — H&P (Signed)
Primary Care Physician:  [name], MD Primary Gastroenterologist:  Dr. Oneida Alar  Pre-Procedure History & Physical: HPI:  Tara [name] is a 68 y.o. female here for [place].  Past Medical History  Diagnosis Date  . H/O: hysterectomy   . History of cancer chemotherapy   . History of radiation therapy   . Depression   . GERD (gastroesophageal reflux disease)   . 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 Hiatal hernia   . Hyperlipidemia   . Nonischemic cardiomyopathy   . Breast cancer 2008    right - s/p lumpectomy->chemo, radiation  . Dysrhythmia     hx SVT  . Anxiety   . Hypertension     Past Surgical History  Procedure Laterality Date  . Total abdominal hysterectomy w/ bilateral salpingoophorectomy  1994  . Cholecystectomy  1999  . Abdominal hysterectomy  1994    fibroids,   . Breast surgery Right 2008    lumpectomy, cancer  . Colonoscopy  2008    Dr. Oneida Alar: multiple polyps. Path not available at time of visit.     Prior to Admission medications   Medication Sig Start Date End Date Taking? Authorizing Provider  alendronate (FOSAMAX) 70 MG tablet Take 1 tablet (70 mg total) by mouth every 7 (seven) days. Take with a full glass of water on an empty stomach. 02/14/14  Yes [name], MD  cholecalciferol (VITAMIN D) 1000 UNITS tablet Take 1,000 Units by mouth daily.   Yes Historical Provider, MD  clonazePAM (KLONOPIN) 1 MG tablet Take 1 mg by mouth at bedtime.  09/28/12  Yes Historical Provider, MD  docusate sodium (COLACE) 100 MG capsule Take 100 mg by mouth 2 (two) times daily.   Yes Historical Provider, MD  lansoprazole (PREVACID) 30 MG capsule Take 1 capsule (30 mg total) by mouth daily. For acid reflux 01/31/14  Yes [name], MD  Linaclotide [facility]) 145 MCG CAPS capsule Take 1 capsule (145 mcg total) by mouth daily. Take 30 minutes prior to breakfast 03/30/14  Yes Orvil Feil, NP  lisinopril  (PRINIVIL,ZESTRIL) 5 MG tablet TAKE ONE TABLET BY MOUTH ONCE DAILY.   Yes Thompson Grayer, MD  lovastatin (MEVACOR) 40 MG tablet Take 1 tablet (40 mg total) by mouth at bedtime. 01/31/14  Yes [name], MD  metoCLOPramide (REGLAN) 10 MG tablet Take 1 tablet (10 mg total) by mouth 2 (two) times daily. 10/10/13  Yes [name], MD  sertraline (ZOLOFT) 50 MG tablet Take 50 mg by mouth daily.    Yes Historical Provider, MD  verapamil (CALAN-SR) 120 MG CR tablet Take 1 tablet (120 mg total) by mouth daily. 06/06/13  Yes Thompson Grayer, MD  zolpidem (AMBIEN) 10 MG tablet Take 10 mg by mouth at bedtime as needed for sleep.    Yes Historical Provider, MD    Allergies as of 03/30/2014 - Review Complete 03/30/2014  Allergen Reaction Noted  . Aspirin Other (See Comments) 01/31/2014    Family History  Problem Relation Age of Onset  . Colon cancer Father 11    died at age 42  . Cancer Father     colon   . Hypertension Sister   . Cancer Brother 3    prostate    History   Social History  . Marital Status: Married    Spouse Name: N/A    Number of Children: 2  . Years of Education: N/A  Occupational History  . disabled     Social History Main Topics  . Smoking status: Never Smoker   . Smokeless tobacco: Not on file  . Alcohol Use: No  . Drug Use: No  . Sexual Activity: Yes    Birth Control/ Protection: Surgical   Other Topics Concern  . Not on file   Social History Narrative   Lives in [place] with family.  Does not routinely exercise.    Review of Systems: See HPI, otherwise negative ROS   Physical Exam: BP 112/59  Pulse 69  Temp(Src) 98 F (36.7 C) (Oral)  Resp 18  SpO2 97% General:   Alert,  pleasant and cooperative in NAD Head:  Normocephalic and atraumatic. Neck:  Supple; Lungs:  Clear throughout to auscultation.    Heart:  Regular rate and rhythm. Abdomen:  Soft, nontender and nondistended. Normal bowel sounds, without guarding, and without  rebound.   Neurologic:  Alert and  oriented x4;  grossly normal neurologically.  Impression/Plan:     SCREENING  Plan:  1. TCS TODAY

## 2014-04-25 NOTE — Discharge Instructions (Signed)
You had 6 polyps removed. You have  internal hemorrhoids and diverticulosis IN YOUR LEFT COLON.   FOLLOW A HIGH FIBER DIET. AVOID ITEMS THAT CAUSE BLOATING. SEE INFO BELOW.  YOUR BIOPSY RESULTS SHOULD BE BACK IN 7 DAYS.  Next colonoscopy in 1-3 years.   Colonoscopy Care After Read the instructions outlined below and refer to this sheet in the next week. These discharge instructions provide you with general information on caring for yourself after you leave the hospital. While your treatment has been planned according to the most current medical practices available, unavoidable complications occasionally occur. If you have any problems or questions after discharge, call DR. Larnell Granlund, [phone].  ACTIVITY  You may resume your regular activity, but move at a slower pace for the next 24 hours.   Take frequent rest periods for the next 24 hours.   Walking will help get rid of the air and reduce the bloated feeling in your belly (abdomen).   No driving for 24 hours (because of the medicine (anesthesia) used during the test).   You may shower.   Do not sign any important legal documents or operate any machinery for 24 hours (because of the anesthesia used during the test).    NUTRITION  Drink plenty of fluids.   You may resume your normal diet as instructed by your doctor.   Begin with a light meal and progress to your normal diet. Heavy or fried foods are harder to digest and may make you feel sick to your stomach (nauseated).   Avoid alcoholic beverages for 24 hours or as instructed.    MEDICATIONS  You may resume your normal medications.   WHAT YOU CAN EXPECT TODAY  Some feelings of bloating in the abdomen.   Passage of more gas than usual.   Spotting of blood in your stool or on the toilet paper  .  IF YOU HAD POLYPS REMOVED DURING THE COLONOSCOPY:  Eat a soft diet IF YOU HAVE NAUSEA, BLOATING, ABDOMINAL PAIN, OR VOMITING.    FINDING OUT THE RESULTS OF YOUR  TEST Not all test results are available during your visit. DR. Oneida Alar WILL CALL YOU WITHIN 7 DAYS OF YOUR PROCEDUE WITH YOUR RESULTS. Do not assume everything is normal if you have not heard from DR. Ladell Bey IN ONE WEEK, CALL HER OFFICE AT [phone].  SEEK IMMEDIATE MEDICAL ATTENTION AND CALL THE OFFICE: [phone] IF:  You have more than a spotting of blood in your stool.   Your belly is swollen (abdominal distention).   You are nauseated or vomiting.   You have a temperature over 101F.   You have abdominal pain or discomfort that is severe or gets worse throughout the day.  Polyps, Colon  A polyp is extra tissue that grows inside your body. Colon polyps grow in the large intestine. The large intestine, also called the colon, is part of your digestive system. It is a long, hollow tube at the end of your digestive tract where your body makes and stores stool. Most polyps are not dangerous. They are benign. This means they are not cancerous. But over time, some types of polyps can turn into cancer. Polyps that are smaller than a pea are usually not harmful. But larger polyps could someday become or may already be cancerous. To be safe, doctors remove all polyps and test them.   WHO GETS POLYPS? Anyone can get polyps, but certain people are more likely than others. You may have a greater chance of getting  polyps if:  You are over 58.   You have had polyps before.   Someone in your family has had polyps.   Someone in your family has had cancer of the large intestine.   Find out if someone in your family has had polyps. You may also be more likely to get polyps if you:   Eat a lot of fatty foods   Smoke   Drink alcohol   Do not exercise  Eat too much   TREATMENT  The caregiver will remove the polyp during sigmoidoscopy or colonoscopy.  PREVENTION There is not one sure way to prevent polyps. You might be able to lower your risk of getting them if you:  Eat more fruits and  vegetables and less fatty food.   Do not smoke.   Avoid alcohol.   Exercise every day.   Lose weight if you are overweight.   Eating more calcium and folate can also lower your risk of getting polyps. Some foods that are rich in calcium are milk, cheese, and broccoli. Some foods that are rich in folate are chickpeas, kidney beans, and spinach.   High-Fiber Diet A high-fiber diet changes your normal diet to include more whole grains, legumes, fruits, and vegetables. Changes in the diet involve replacing refined carbohydrates with unrefined foods. The calorie level of the diet is essentially unchanged. The Dietary Reference Intake (recommended amount) for adult males is 38 grams per day. For adult females, it is 25 grams per day. Pregnant and lactating women should consume 28 grams of fiber per day. Fiber is the intact part of a plant that is not broken down during digestion. Functional fiber is fiber that has been isolated from the plant to provide a beneficial effect in the body. PURPOSE  Increase stool bulk.   Ease and regulate bowel movements.   Lower cholesterol.  INDICATIONS THAT YOU NEED MORE FIBER  Constipation and hemorrhoids.   Uncomplicated diverticulosis (intestine condition) and irritable bowel syndrome.   Weight management.   As a protective measure against hardening of the arteries (atherosclerosis), diabetes, and cancer.   GUIDELINES FOR INCREASING FIBER IN THE DIET  Start adding fiber to the diet slowly. A gradual increase of about 5 more grams (2 slices of whole-wheat bread, 2 servings of most fruits or vegetables, or 1 bowl of high-fiber cereal) per day is best. Too rapid an increase in fiber may result in constipation, flatulence, and bloating.   Drink enough water and fluids to keep your urine clear or pale yellow. Water, juice, or caffeine-free drinks are recommended. Not drinking enough fluid may cause constipation.   Eat a variety of high-fiber foods rather  than one type of fiber.   Try to increase your intake of fiber through using high-fiber foods rather than fiber pills or supplements that contain small amounts of fiber.   The goal is to change the types of food eaten. Do not supplement your present diet with high-fiber foods, but replace foods in your present diet.  INCLUDE A VARIETY OF FIBER SOURCES  Replace refined and processed grains with whole grains, canned fruits with fresh fruits, and incorporate other fiber sources. White rice, white breads, and most bakery goods contain little or no fiber.   Brown whole-grain rice, buckwheat oats, and many fruits and vegetables are all good sources of fiber. These include: broccoli, Brussels sprouts, cabbage, cauliflower, beets, sweet potatoes, white potatoes (skin on), carrots, tomatoes, eggplant, squash, berries, fresh fruits, and dried fruits.   Cereals  appear to be the richest source of fiber. Cereal fiber is found in whole grains and bran. Bran is the fiber-rich outer coat of cereal grain, which is largely removed in refining. In whole-grain cereals, the bran remains. In breakfast cereals, the largest amount of fiber is found in those with "bran" in their names. The fiber content is sometimes indicated on the label.   You may need to include additional fruits and vegetables each day.   In baking, for 1 cup white flour, you may use the following substitutions:   1 cup whole-wheat flour minus 2 tablespoons.   1/2 cup white flour plus 1/2 cup whole-wheat flour.   Diverticulosis Diverticulosis is a common condition that develops when small pouches (diverticula) form in the wall of the colon. The risk of diverticulosis increases with age. It happens more often in people who eat a low-fiber diet. Most individuals with diverticulosis have no symptoms. Those individuals with symptoms usually experience belly (abdominal) pain, constipation, or loose stools (diarrhea).  HOME CARE  INSTRUCTIONS  Increase the amount of fiber in your diet as directed by your caregiver or dietician. This may reduce symptoms of diverticulosis.   Drink at least 6 to 8 glasses of water each day to prevent constipation.   Try not to strain when you have a bowel movement.   Avoiding nuts and seeds to prevent complications is still an uncertain benefit.       FOODS HAVING HIGH FIBER CONTENT INCLUDE:  Fruits. Apple, peach, pear, tangerine, raisins, prunes.   Vegetables. Brussels sprouts, asparagus, broccoli, cabbage, carrot, cauliflower, romaine lettuce, spinach, summer squash, tomato, winter squash, zucchini.   Starchy Vegetables. Baked beans, kidney beans, lima beans, split peas, lentils, potatoes (with skin).   Grains. Whole wheat bread, brown rice, bran flake cereal, plain oatmeal, white rice, shredded wheat, bran muffins.    SEEK IMMEDIATE MEDICAL CARE IF:  You develop increasing pain or severe bloating.   You have an oral temperature above 101F.   You develop vomiting or bowel movements that are bloody or black.   Hemorrhoids Hemorrhoids are dilated (enlarged) veins around the rectum. Sometimes clots will form in the veins. This makes them swollen and painful. These are called thrombosed hemorrhoids. Causes of hemorrhoids include:  Constipation.   Straining to have a bowel movement.   HEAVY LIFTING HOME CARE INSTRUCTIONS  Eat a well balanced diet and drink 6 to 8 glasses of water every day to avoid constipation. You may also use a bulk laxative.   Avoid straining to have bowel movements.   Keep anal area dry and clean.   Do not use a donut shaped pillow or sit on the toilet for long periods. This increases blood pooling and pain.   Move your bowels when your body has the urge; this will require less straining and will decrease pain and pressure.

## 2014-04-25 NOTE — Transfer of Care (Signed)
Immediate Anesthesia Transfer of Care Note  Patient: Tara [name]  Procedure(s) Performed: Procedure(s) with comments: COLONOSCOPY WITH PROPOFOL (N/A) - Cecum time in 1132     time out 1203 total time 31 minutes POLYPECTOMY (N/A) - Ascending and Decending Colon x3 , Transverse colon x2, rectal  Patient Location: PACU  Anesthesia Type:MAC  Level of Consciousness: awake, alert  and oriented  Airway & Oxygen Therapy: Patient Spontanous Breathing and Patient connected to face mask oxygen  Post-op Assessment: Report given to PACU RN  Post vital signs: Reviewed and stable  Complications: No apparent anesthesia complications

## 2014-04-26 ENCOUNTER — Encounter (HOSPITAL_COMMUNITY): Payer: Self-pay | Admitting: Gastroenterology

## 2014-04-26 ENCOUNTER — Ambulatory Visit (INDEPENDENT_AMBULATORY_CARE_PROVIDER_SITE_OTHER): Payer: Medicare Other | Admitting: Nurse Practitioner

## 2014-04-26 VITALS — BP 110/80 | HR 76 | Ht <= 58 in | Wt 174.1 lb

## 2014-04-26 DIAGNOSIS — I428 Other cardiomyopathies: Secondary | ICD-10-CM

## 2014-04-26 NOTE — Patient Instructions (Signed)
I think you are doing well  Let us know if your cough continues over the next several weeks - we may need to change your medicine  See Dr. Rayann Heman in February.  Call the [facility] office at [phone] if you have any questions, problems or concerns.

## 2014-04-26 NOTE — Progress Notes (Signed)
Tara [name] Date of Birth: 05/07/1946 Medical Record #[id number]  History of Present Illness: [name] is seen back today for a 6 month check. Seen for Dr. Rayann Heman. She is a 68 year old black female with history of SVT - short RP SVT documented, HLD, and NICM. Other issues include breast cancer, GERD and depression. Her EF is 45% by recent echo and she has declined further evaluation.   Last seen here in February. Was doing ok.  Comes back today. Here with her husband. Doing well. Says she is ok. She had her routine colonoscopy yesterday - had 6 polyps taken out. No chest pain. Not short of breath. No swelling. No palpitations. Tolerating her heart medicines without issue. She feels like she is doing well. Labs checked by PCP. She has had a cough for the last 2 weeks - more so in the morning. Not productive.   Current Outpatient Prescriptions  Medication Sig Dispense Refill  . acetaminophen (TYLENOL) 325 MG tablet Take 650 mg by mouth as needed.      Marland Kitchen alendronate (FOSAMAX) 70 MG tablet Take 1 tablet (70 mg total) by mouth every 7 (seven) days. Take with a full glass of water on an empty stomach.  12 tablet  1  . cholecalciferol (VITAMIN D) 1000 UNITS tablet Take 1,000 Units by mouth daily.      . clonazePAM (KLONOPIN) 1 MG tablet Take 1 mg by mouth at bedtime.       . docusate sodium (COLACE) 100 MG capsule Take 100 mg by mouth 2 (two) times daily.      . lansoprazole (PREVACID) 30 MG capsule Take 1 capsule (30 mg total) by mouth daily. For acid reflux  90 capsule  1  . lisinopril (PRINIVIL,ZESTRIL) 5 MG tablet TAKE ONE TABLET BY MOUTH ONCE DAILY.  30 tablet  5  . lovastatin (MEVACOR) 40 MG tablet Take 1 tablet (40 mg total) by mouth at bedtime.  90 tablet  1  . metoCLOPramide (REGLAN) 10 MG tablet Take 1 tablet (10 mg total) by mouth 2 (two) times daily.  180 tablet  0  . sertraline (ZOLOFT) 50 MG tablet Take 50 mg by mouth daily.       . verapamil (CALAN-SR) 120 MG CR tablet Take 1  tablet (120 mg total) by mouth daily.  90 tablet  3  . zolpidem (AMBIEN) 10 MG tablet Take 10 mg by mouth at bedtime as needed for sleep.       . Linaclotide (LINZESS) 145 MCG CAPS capsule Take 1 capsule (145 mcg total) by mouth daily. Take 30 minutes prior to breakfast  30 capsule  3   No current facility-administered medications for this visit.    Allergies  Allergen Reactions  . Aspirin Other (See Comments)    Reports GI bleed with daily use    Past Medical History  Diagnosis Date  . H/O: hysterectomy   . History of cancer chemotherapy   . History of radiation therapy   . Depression   . GERD (gastroesophageal reflux disease)   . 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 Hiatal hernia   . Hyperlipidemia   . Nonischemic cardiomyopathy   . Breast cancer 2008    right - s/p lumpectomy->chemo, radiation  . Dysrhythmia     hx SVT  . Anxiety   . Hypertension     Past Surgical History  Procedure Laterality Date  . Total abdominal hysterectomy w/  bilateral salpingoophorectomy  1994  . Cholecystectomy  1999  . Abdominal hysterectomy  1994    fibroids,   . Breast surgery Right 2008    lumpectomy, cancer  . Colonoscopy  2008    Dr. Oneida Alar: multiple polyps. Path not available at time of visit.   . Colonoscopy with propofol N/A 04/25/2014    Procedure: COLONOSCOPY WITH PROPOFOL;  Surgeon: Danie Binder, MD;  Location: [facility];  Service: Endoscopy;  Laterality: N/A;  Cecum time in 1132     time out 1203 total time 31 minutes  . Polypectomy N/A 04/25/2014    Procedure: POLYPECTOMY;  Surgeon: Danie Binder, MD;  Location: [facility];  Service: Endoscopy;  Laterality: N/A;  Ascending and Decending Colon x3 , Transverse colon x2, rectal    History  Smoking status  . Never Smoker   Smokeless tobacco  . Not on file    History  Alcohol Use No    Family History  Problem Relation Age of Onset  . Colon cancer Father 62    died at age 61  . Cancer  Father     colon   . Hypertension Sister   . Cancer Brother 31    prostate    Review of Systems: The review of systems is per the HPI.  All other systems were reviewed and are negative.  Physical Exam: BP 110/80  Pulse 76  Ht 4\' 9"  (1.448 m)  Wt 174 lb 1.9 oz (78.98 kg)  BMI 37.67 kg/m2  SpO2 95% Patient is very pleasant and in no acute distress. She is obese. Skin is warm and dry. Color is normal.  HEENT is unremarkable. Normocephalic/atraumatic. PERRL. Sclera are nonicteric. Neck is supple. No masses. No JVD. Lungs are clear. Cardiac exam shows a regular rate and rhythm. Abdomen is soft. Extremities are without edema. Gait and ROM are intact. No gross neurologic deficits noted.  Wt Readings from Last 3 Encounters:  04/26/14 174 lb 1.9 oz (78.98 kg)  04/19/14 175 lb (79.379 kg)  03/30/14 173 lb (78.472 kg)    LABORATORY DATA/PROCEDURES:  Lab Results  Component Value Date   WBC 10.2 01/31/2014   HGB 12.0 04/19/2014   HCT 36.6 04/19/2014   PLT 244 01/31/2014   GLUCOSE 84 04/19/2014   CHOL 169 01/31/2014   TRIG 81 01/31/2014   HDL 53 01/31/2014   LDLCALC 100* 01/31/2014   ALT 8 01/31/2014   AST 14 01/31/2014   NA 142 04/19/2014   K 4.7 04/19/2014   CL 104 04/19/2014   CREATININE 1.11* 04/19/2014   BUN 14 04/19/2014   CO2 27 04/19/2014   TSH 3.392 01/31/2014   HGBA1C 5.9* 01/31/2014    BNP (last 3 results) No results found for this basename: PROBNP,  in the last 8760 hours  Echo Study Conclusions from October of 2014  - Left ventricle: Inferior wall may move slightly less well than the other walls. The cavity size was moderately dilated. Wall thickness was normal. The estimated ejection fraction was 45%. - Right ventricle: The cavity size was normal. Systolic function was normal. - Pulmonary arteries: PA peak pressure: 74mm Hg (S). - Impressions: Wall motion looks similar to prior study. Impressions:  - Wall motion looks similar to prior study.  Assessment / Plan: 1.  NICM - looks compensated and doing well. No change in her current regimen. She has not wished for further titration of her medicines or further testing.   2. Cough - doubt its from  her ACE but I have asked her to call if this persists over the next several weeks.   See back as planned.   Patient is agreeable to this plan and will call if any problems develop in the interim.   Burtis Junes, RN, [place] [address] [place], [place]  [phone]

## 2014-04-26 NOTE — Op Note (Signed)
[facility] [address] [place], [address]   COLONOSCOPY PROCEDURE REPORT  PATIENT: Tara [name], Tara [name]  MR#: [id number] BIRTHDATE: 04-30-1946 , 68  yrs. old GENDER: Female ENDOSCOPIST: Barney Drain, MD REFERRED AS:[id number] Moshe Cipro, M.D. PROCEDURE DATE:  04/25/2014 PROCEDURE:   Colonoscopy with snare polypectomy and with cold biopsy polypectomy INDICATIONS:High risk patient with personal history of colonic polyps and Patient's immediate family history of colon cancer. MEDICATIONS: MAC sedation, administered by CRNA  DESCRIPTION OF PROCEDURE:    Physical exam was performed.  Informed consent was obtained from the patient after explaining the benefits, risks, and alternatives to procedure.  The patient was connected to monitor and placed in left lateral position. Continuous oxygen was provided by nasal cannula and IV medicine administered through an indwelling cannula.  After administration of sedation and rectal exam, the patients rectum was intubated and the     colonoscope was advanced under direct visualization to the cecum.  The scope was removed slowly by carefully examining the color, texture, anatomy, and integrity mucosa on the way out.  The patient was recovered in endoscopy and discharged home in satisfactory condition.    COLON FINDINGS: Two sessile polyps measuring 3-4 mm in size were found in the ascending colon(1) and rectum.  A polypectomy was performed with cold forceps.  , Five sessile polyps measuring 6-8 mm in size were found in the descending colon(3) and transverse colon(2).  A polypectomy was performed using snare cautery.  , There was mild diverticulosis noted in the descending colon and sigmoid colon with associated muscular hypertrophy.  , and Moderate sized internal hemorrhoids were found.  PREP QUALITY: good.  CECAL W/D TIME: 28 minutes     COMPLICATIONS: None  ENDOSCOPIC IMPRESSION: 1.   SEVEN COLONPOLYPS REMOVED 2.   Mild  diverticulosis in the descending colon and sigmoid colon 3.   Moderate sized internal hemorrhoids  RECOMMENDATIONS: FOLLOW A HIGH FIBER DIET.  AVOID ITEMS THAT CAUSE BLOATING. BIOPSY RESULTS SHOULD BE BACK IN 7 DAYS.  Next colonoscopy in 1 YEAR IF ADVANCED AND 3 years IF SIMPLE ADENOMAS.       _______________________________ Lorrin MaisBarney Drain, MD 04/26/2014 11:26 AM

## 2014-05-03 ENCOUNTER — Other Ambulatory Visit: Payer: Self-pay

## 2014-05-03 MED ORDER — LISINOPRIL 5 MG PO TABS: ORAL_TABLET | ORAL | Status: DC

## 2014-05-09 ENCOUNTER — Telehealth: Payer: Self-pay

## 2014-05-09 NOTE — Telephone Encounter (Signed)
Pt is wanting to know the results from her TCS

## 2014-05-09 NOTE — Telephone Encounter (Signed)
Please call pt. 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   FOLLOW A HIGH FIBER DIET. AVOID ITEMS THAT CAUSE BLOATING.  Next colonoscopy in 3 years. ALL SISTERS, BROTHERS, CHILDREN, AND PARENTS NEED TO HAVE A COLONOSCOPY STARTING AT THE AGE OF 40.

## 2014-05-10 NOTE — Telephone Encounter (Signed)
Pt aware of results 

## 2014-05-11 ENCOUNTER — Telehealth: Payer: Self-pay | Admitting: Internal Medicine

## 2014-05-11 NOTE — Telephone Encounter (Signed)
New Message  Pt called states that lisinopril is continuously making her cough and she is requesting an alternative.. Coughing to the point of nausea

## 2014-05-11 NOTE — Telephone Encounter (Signed)
Reviewed with Dr. Rayann Heman who advised stop Lisinopril and monitor BP for 4 weeks and call back to let us know how she is feeling.  Patient verbalized understanding of these instructions and agrees to call back and is aware to call back sooner with problems or concerns.

## 2014-05-11 NOTE — Telephone Encounter (Signed)
Spoke with patient who states she is coughing continuously to the point of nausea.  I reviewed patient's chart from 8/5 visit with Truitt Merle, NP who states that patient should contact us if cough continues and ACE inhibitor may need to be stopped.  Patient reports BP over last week has been:  112/70,120/72, 113/70; states heart rate is around 68 bpm.  I advised patient that I will discuss with Dr. Rayann Heman, primary cardiologist, who is in the office today and will call her back with his advice.  Patient verbalized understanding and agreement.

## 2014-06-07 ENCOUNTER — Telehealth: Payer: Self-pay | Admitting: Cardiovascular Disease

## 2014-06-07 NOTE — Telephone Encounter (Signed)
Patient says her BP is running 110/70 and wants to continue monitoring her BP's and if they start to increase she will let us know and would be willing to start Losartan.  She has been off the Lisinopril for 1 month and will call back if her pressure starts to increase

## 2014-06-07 NOTE — Telephone Encounter (Signed)
Would she will be willing to try Losartan 25 mg a day?  If so, BMET in 2 weeks. Ask her to continue to monitor her BP's.  Thanks.

## 2014-06-07 NOTE — Telephone Encounter (Signed)
Ok - please let her know that this is not just for BP but for LV dysfunction as well.  Can start even lower if she desires at 12.5 mg

## 2014-06-07 NOTE — Telephone Encounter (Signed)
Cecille Rubin, anything I need to do for her?

## 2014-06-07 NOTE — Telephone Encounter (Signed)
° °  FYI! Patient wanted to Dr Angelena Form to know since she stopped the Lisinopril, she is no longer coughing.

## 2014-06-07 NOTE — Telephone Encounter (Signed)
Pt of [facility].

## 2014-06-17 ENCOUNTER — Telehealth: Payer: Self-pay | Admitting: Hematology and Oncology

## 2014-06-17 NOTE — Telephone Encounter (Signed)
Lft msg for pt regarding labs/ov and MD change, mailed sch........ KJ °

## 2014-06-21 ENCOUNTER — Other Ambulatory Visit: Payer: Self-pay

## 2014-06-21 MED ORDER — LANSOPRAZOLE 30 MG PO CPDR: 30.0000 mg | DELAYED_RELEASE_CAPSULE | Freq: Every day | ORAL | Status: DC

## 2014-06-21 MED ORDER — ALENDRONATE SODIUM 70 MG PO TABS
70.0000 mg | ORAL_TABLET | ORAL | Status: DC
Start: 2014-06-21 — End: 2014-12-18

## 2014-06-29 ENCOUNTER — Other Ambulatory Visit: Payer: Self-pay

## 2014-06-29 MED ORDER — LOVASTATIN 40 MG PO TABS: 40.0000 mg | ORAL_TABLET | Freq: Every day | ORAL | Status: DC

## 2014-07-04 ENCOUNTER — Other Ambulatory Visit: Payer: Self-pay | Admitting: Internal Medicine

## 2014-07-04 NOTE — Telephone Encounter (Signed)
Spoke with patient fand her cough stopped when she d/c'd the Lisinopril.  She was hesitant to start Losartan as her BP has been running low.  I called her today to see how she was feeling and to try and she if she would be willing to start 12.5mg  daily.  I explained to her that it will; help with her heart pumping function.  She is going to consider and call back if she decides she wants to start.  She is due for follow up in February with Dr Rayann Heman

## 2014-08-08 ENCOUNTER — Other Ambulatory Visit (HOSPITAL_COMMUNITY)
Admission: RE | Admit: 2014-08-08 | Discharge: 2014-08-08 | Disposition: A | Discharge status: Home / Self Care | Payer: Medicare Other | Source: Ambulatory Visit | Attending: Family Medicine | Admitting: Family Medicine

## 2014-08-08 ENCOUNTER — Encounter: Payer: Self-pay | Admitting: Family Medicine

## 2014-08-08 ENCOUNTER — Ambulatory Visit (INDEPENDENT_AMBULATORY_CARE_PROVIDER_SITE_OTHER): Payer: Medicare Other | Admitting: Family Medicine

## 2014-08-08 VITALS — BP 122/84 | HR 80 | Resp 16 | Ht <= 58 in | Wt 177.1 lb

## 2014-08-08 DIAGNOSIS — Z0000 Encounter for general adult medical examination without abnormal findings: Secondary | ICD-10-CM

## 2014-08-08 DIAGNOSIS — Z124 Encounter for screening for malignant neoplasm of cervix: Secondary | ICD-10-CM

## 2014-08-08 DIAGNOSIS — E785 Hyperlipidemia, unspecified: Secondary | ICD-10-CM

## 2014-08-08 DIAGNOSIS — R7303 Prediabetes: Secondary | ICD-10-CM

## 2014-08-08 DIAGNOSIS — E8881 Metabolic syndrome: Secondary | ICD-10-CM

## 2014-08-08 DIAGNOSIS — Z01419 Encounter for gynecological examination (general) (routine) without abnormal findings: Secondary | ICD-10-CM | POA: Insufficient documentation

## 2014-08-08 DIAGNOSIS — H6123 Impacted cerumen, bilateral: Secondary | ICD-10-CM

## 2014-08-08 DIAGNOSIS — J302 Other seasonal allergic rhinitis: Secondary | ICD-10-CM

## 2014-08-08 DIAGNOSIS — R7309 Other abnormal glucose: Secondary | ICD-10-CM

## 2014-08-08 DIAGNOSIS — Z1211 Encounter for screening for malignant neoplasm of colon: Secondary | ICD-10-CM

## 2014-08-08 LAB — LIPID PANEL
CHOL/HDL RATIO: 3.2 ratio
Cholesterol: 180 mg/dL (ref 0–200)
HDL: 57 mg/dL (ref >39)
LDL CALC: 102 mg/dL — AB (ref 0–99)
Triglycerides: 106 mg/dL (ref <150)
VLDL: 21 mg/dL (ref 0–40)

## 2014-08-08 LAB — POC HEMOCCULT BLD/STL (OFFICE/1-CARD/DIAGNOSTIC): Fecal Occult Blood, POC: NEGATIVE

## 2014-08-08 LAB — COMPLETE METABOLIC PANEL WITH GFR
ALK PHOS: 103 U/L (ref 39–117)
ALT: 9 U/L (ref 0–35)
AST: 14 U/L (ref 0–37)
Albumin: 3.9 g/dL (ref 3.5–5.2)
BUN: 12 mg/dL (ref 6–23)
CALCIUM: 9.6 mg/dL (ref 8.4–10.5)
CHLORIDE: 105 meq/L (ref 96–112)
CO2: 26 mEq/L (ref 19–32)
Creat: 1.07 mg/dL (ref 0.50–1.10)
GFR, Est African American: 62 mL/min
GFR, Est Non African American: 53 mL/min — ABNORMAL LOW
Glucose, Bld: 99 mg/dL (ref 70–99)
Potassium: 4.3 mEq/L (ref 3.5–5.3)
Sodium: 141 mEq/L (ref 135–145)
Total Bilirubin: 0.3 mg/dL (ref 0.2–1.2)
Total Protein: 7.4 g/dL (ref 6.0–8.3)

## 2014-08-08 MED ORDER — METOCLOPRAMIDE HCL 10 MG PO TABS: 10.0000 mg | ORAL_TABLET | Freq: Two times a day (BID) | ORAL | Status: DC

## 2014-08-08 MED ORDER — LORATADINE 10 MG PO TABS: 10.0000 mg | ORAL_TABLET | Freq: Every day | ORAL | Status: DC

## 2014-08-08 NOTE — Patient Instructions (Signed)
Annual wellness in 4.5 month, call if you need me before  Ear flush today  Loratidine sent in for once daily use for allergies  Pls continue Bran and Prune juice for bowel movements  Continue reglan TWICE daily , we will refill  Work on smaller portions and less sweetsd and startch so you do not become diabetic  Labs needed, lipid, cmp and EGFR , hBA1C for this visit (today if possible)  If you do not get the flu vaccine today, pls come back any Mon through Thursday regular hours for the vaccine

## 2014-08-08 NOTE — Progress Notes (Signed)
   Subjective:    Patient ID: Tara [name], female    DOB: 07-27-1946, 68 y.o.   MRN: [id number]  HPI Patient is in for pelvic and breast exam. No other health concerns are expressed or addressed at the visit.    Review of Systems See HPI     Objective:   Physical Exam  BP 122/84 mmHg  Pulse 80  Resp 16  Ht 4\' 9"  (1.448 m)  Wt 177 lb 1.9 oz (80.341 kg)  BMI 38.32 kg/m2  SpO2 95%  Pleasant well nourished female, alert and oriented x 3, in no cardio-pulmonary distress. Afebrile. HEENT No facial trauma or asymetry. Sinuses non tender.  Extra occullar muscles intact, pupils equally reactive to light. External ears normal, tympanic membranes clear after bilateral ear irrigation due to cerumen impaction Oropharynx moist, no exudate, fair  dentition. Neck: supple, no adenopathy,JVD or thyromegaly.No bruits.  Chest: Clear to ascultation bilaterally.No crackles or wheezes. Non tender to palpation  Breast: Nono masses or lumps. No tenderness. No nipple discharge or inversion.surgical scar on right breast from lumpectomy for cancer No axillary or supraclavicular adenopathy  Cardiovascular system; Heart sounds normal,  S1 and  S2 ,no S3.  No murmur, or thrill. Apical beat not displaced Peripheral pulses normal.  Abdomen: Soft, non tender, no organomegaly or masses. No bruits. Bowel sounds normal. No guarding, tenderness or rebound.  Rectal:  Normal sphincter tone. No mass.No rectal masses.  Guaiac negative stool.  GU: External genitalia normal female genitalia , female distribution of hair. No lesions. Urethral meatus normal in size, no  Prolapse, no lesions visibly  Present. Bladder non tender. Vagina pink and moist , with no visible lesions , physiologic  discharge present . Adequate pelvic support no  cystocele or rectocele noted  Uterus absentze, no adnexal masses, adnexal tenderness.   Musculoskeletal exam: Full ROM of spine, hips , shoulders and  knees. No deformity ,swelling or crepitus noted. No muscle wasting or atrophy.   Neurologic: Cranial nerves 2 to 12 intact. Power, tone ,sensation and reflexes normal throughout. No disturbance in gait. No tremor.  Skin: Intact, no ulceration, erythema , scaling or rash noted. Pigmentation normal throughout  Psych; Normal mood and affect. Judgement and concentration normal       Assessment & Plan:  Encounter for annual physical exam Annual exam as documented. Counseling done  re healthy lifestyle involving commitment to 150 minutes exercise per week, heart healthy diet, and attaining healthy weight.The importance of adequate sleep also discussed. Regular seat belt use and home safety, is also discussed. Changes in health habits are decided on by the patient with goals and time frames  set for achieving them. Immunization and cancer screening needs are specifically addressed at this visit.   Excessive cerumen in both ear canals Successful atraumatic ear flush by nursing staff  Special screening for malignant neoplasms, colon No mass, heme negative stool

## 2014-08-09 LAB — HEMOGLOBIN A1C
HEMOGLOBIN A1C: 6 % — AB (ref <5.7)
MEAN PLASMA GLUCOSE: 126 mg/dL — AB (ref <117)

## 2014-08-09 LAB — CYTOLOGY - PAP

## 2014-08-21 ENCOUNTER — Other Ambulatory Visit: Payer: Self-pay | Admitting: Family Medicine

## 2014-08-21 DIAGNOSIS — G479 Sleep disorder, unspecified: Secondary | ICD-10-CM

## 2014-08-22 ENCOUNTER — Telehealth: Payer: Self-pay | Admitting: Family Medicine

## 2014-08-22 NOTE — Telephone Encounter (Signed)
Noted  

## 2014-08-22 NOTE — Telephone Encounter (Signed)
Pls explain to pt that the note from her psychiatrist recommended she have a sleep study, her psychiatrist noted that they had discussed this with her and she asked that the test be ordeered from this office, so this is why I ordered the test.  Pls explain to her that if she does not want to do the test, although it is being recommended to help to improve her health she needs to be clear about that and the referral will be cancelled based on her preference/choice  Explain test is meant to help not hurt , and in her best interest on medical grounds

## 2014-08-23 NOTE — Telephone Encounter (Signed)
Pt states she will think about it and call you back if wanted

## 2014-09-25 DIAGNOSIS — Z1211 Encounter for screening for malignant neoplasm of colon: Secondary | ICD-10-CM | POA: Insufficient documentation

## 2014-09-25 DIAGNOSIS — H6123 Impacted cerumen, bilateral: Secondary | ICD-10-CM | POA: Insufficient documentation

## 2014-09-25 NOTE — Assessment & Plan Note (Signed)

## 2014-09-25 NOTE — Assessment & Plan Note (Signed)
Successful atraumatic ear flush by nursing staff

## 2014-09-25 NOTE — Assessment & Plan Note (Signed)
No mass, heme negative stool 

## 2014-10-11 ENCOUNTER — Other Ambulatory Visit: Payer: Self-pay

## 2014-10-11 DIAGNOSIS — C50911 Malignant neoplasm of unspecified site of right female breast: Secondary | ICD-10-CM

## 2014-10-12 ENCOUNTER — Other Ambulatory Visit (HOSPITAL_BASED_OUTPATIENT_CLINIC_OR_DEPARTMENT_OTHER): Payer: Medicare Other

## 2014-10-12 ENCOUNTER — Ambulatory Visit (HOSPITAL_BASED_OUTPATIENT_CLINIC_OR_DEPARTMENT_OTHER): Payer: Medicare Other | Admitting: Hematology and Oncology

## 2014-10-12 VITALS — BP 133/73 | HR 79 | Temp 98.8°F | Resp 18 | Ht <= 58 in | Wt 180.4 lb

## 2014-10-12 DIAGNOSIS — C50911 Malignant neoplasm of unspecified site of right female breast: Secondary | ICD-10-CM

## 2014-10-12 DIAGNOSIS — Z853 Personal history of malignant neoplasm of breast: Secondary | ICD-10-CM

## 2014-10-12 LAB — COMPREHENSIVE METABOLIC PANEL (CC13)
ALK PHOS: 102 U/L (ref 40–150)
ALT: 27 U/L (ref 0–55)
ANION GAP: 10 meq/L (ref 3–11)
AST: 33 U/L (ref 5–34)
Albumin: 3.5 g/dL (ref 3.5–5.0)
BUN: 13.9 mg/dL (ref 7.0–26.0)
CHLORIDE: 108 meq/L (ref 98–109)
CO2: 25 mEq/L (ref 22–29)
Calcium: 8.6 mg/dL (ref 8.4–10.4)
Creatinine: 1 mg/dL (ref 0.6–1.1)
EGFR: 67 mL/min/{1.73_m2} — ABNORMAL LOW (ref >90)
Glucose: 105 mg/dl (ref 70–140)
Potassium: 3.9 mEq/L (ref 3.5–5.1)
SODIUM: 143 meq/L (ref 136–145)
TOTAL PROTEIN: 7.4 g/dL (ref 6.4–8.3)
Total Bilirubin: 0.25 mg/dL (ref 0.20–1.20)

## 2014-10-12 LAB — CBC WITH DIFFERENTIAL/PLATELET
BASO%: 0.2 % (ref 0.0–2.0)
Basophils Absolute: 0 10*3/uL (ref 0.0–0.1)
EOS ABS: 0.8 10*3/uL — AB (ref 0.0–0.5)
EOS%: 8.7 % — ABNORMAL HIGH (ref 0.0–7.0)
HCT: 39.3 % (ref 34.8–46.6)
HEMOGLOBIN: 12.4 g/dL (ref 11.6–15.9)
LYMPH%: 24 % (ref 14.0–49.7)
MCH: 25.9 pg (ref 25.1–34.0)
MCHC: 31.6 g/dL (ref 31.5–36.0)
MCV: 82.2 fL (ref 79.5–101.0)
MONO#: 0.4 10*3/uL (ref 0.1–0.9)
MONO%: 4.8 % (ref 0.0–14.0)
NEUT#: 5.6 10*3/uL (ref 1.5–6.5)
NEUT%: 62.3 % (ref 38.4–76.8)
Platelets: 231 10*3/uL (ref 145–400)
RBC: 4.78 10*6/uL (ref 3.70–5.45)
RDW: 15.5 % — ABNORMAL HIGH (ref 11.2–14.5)
WBC: 9 10*3/uL (ref 3.9–10.3)
lymph#: 2.2 10*3/uL (ref 0.9–3.3)

## 2014-10-12 NOTE — Progress Notes (Signed)
Patient Care Team: [name], MD as PCP - General Thompson Grayer, MD as Consulting Physician (Cardiology) Purnell Shoemaker., MD as Attending Physician (Psychiatry) Deatra Robinson, MD as Consulting Physician (Oncology)  DIAGNOSIS: No matching staging information was found for the patient.  SUMMARY OF ONCOLOGIC HISTORY:   Malignant neoplasm of right breast   02/03/2007 Surgery Right breast lumpectomy: Invasive ductal carcinoma 1.5 cm grade 3, 1/2 sentinel nodes positive, ER negative, PR negative, Ki-67 35%, HER-2 negative   03/08/2007 Surgery Right axillary lymph node dissection: 2/8 lymph nodes positive   04/12/2007 - 08/13/2007 Chemotherapy FEC and Taxotere   09/20/2007 - 11/15/2007 Radiation Therapy Adjuvant radiation    CHIEF COMPLIANT: follow-up of breast cancer  INTERVAL HISTORY: Tara [name] is a 69 year old lady with above-mentioned history of breast cancer diagnosed in 2008 she underwent surgery chemotherapy and radiation for triple negative breast cancer. She has been in remission and has been doing extremely well without any major problems or concerns. She is here for annual checkup and breast exams. Denies any lumps or nodules in the breasts. She had noticed a small skin lesion on her leg and wanted me to check on it. It appears to be like an insect bite.  REVIEW OF SYSTEMS:   Constitutional: Denies fevers, chills or abnormal weight loss Eyes: Denies blurriness of vision Ears, nose, mouth, throat, and face: Denies mucositis or sore throat Respiratory: Denies cough, dyspnea or wheezes Cardiovascular: Denies palpitation, chest discomfort or lower extremity swelling Gastrointestinal:  Denies nausea, heartburn or change in bowel habits Skin: Denies abnormal skin rashes Lymphatics: Denies new lymphadenopathy or easy bruising Neurological:Denies numbness, tingling or new weaknesses Behavioral/Psych: Mood is stable, no new changes  Breast:  denies any pain or lumps or nodules  in either breasts All other systems were reviewed with the patient and are negative.  I have reviewed the past medical history, past surgical history, social history and family history with the patient and they are unchanged from previous note.  ALLERGIES:  is allergic to aspirin.  MEDICATIONS:  Current Outpatient Prescriptions  Medication Sig Dispense Refill  . acetaminophen (TYLENOL) 325 MG tablet Take 650 mg by mouth as needed.    Marland Kitchen alendronate (FOSAMAX) 70 MG tablet Take 1 tablet (70 mg total) by mouth every 7 (seven) days. Take with a full glass of water on an empty stomach. 12 tablet 1  . cholecalciferol (VITAMIN D) 1000 UNITS tablet Take 1,000 Units by mouth daily.    . clonazePAM (KLONOPIN) 1 MG tablet Take 1 mg by mouth at bedtime.     . docusate sodium (COLACE) 100 MG capsule Take 100 mg by mouth 2 (two) times daily.    . lansoprazole (PREVACID) 30 MG capsule Take 1 capsule (30 mg total) by mouth daily. For acid reflux 90 capsule 1  . loratadine (CLARITIN) 10 MG tablet Take 1 tablet (10 mg total) by mouth daily. 30 tablet 4  . lovastatin (MEVACOR) 40 MG tablet Take 1 tablet (40 mg total) by mouth at bedtime. 90 tablet 1  . metoCLOPramide (REGLAN) 10 MG tablet Take 1 tablet (10 mg total) by mouth 2 (two) times daily. 180 tablet 0  . sertraline (ZOLOFT) 50 MG tablet Take 50 mg by mouth daily.     . verapamil (CALAN-SR) 120 MG CR tablet TAKE 1 TABLET BY MOUTH ONCE DAILY. 30 tablet 11  . zolpidem (AMBIEN) 10 MG tablet Take 10 mg by mouth at bedtime as needed for sleep.  No current facility-administered medications for this visit.    PHYSICAL EXAMINATION: ECOG PERFORMANCE STATUS: 0 - Asymptomatic  Filed Vitals:   10/12/14 1042  BP: 133/73  Pulse: 79  Temp: 98.8 F (37.1 C)  Resp: 18   Filed Weights   10/12/14 1042  Weight: 180 lb 6.4 oz (81.829 kg)    GENERAL:alert, no distress and comfortable SKIN: skin color, texture, turgor are normal, no rashes or significant  lesions EYES: normal, Conjunctiva are pink and non-injected, sclera clear OROPHARYNX:no exudate, no erythema and lips, buccal mucosa, and tongue normal  NECK: supple, thyroid normal size, non-tender, without nodularity LYMPH:  no palpable lymphadenopathy in the cervical, axillary or inguinal LUNGS: clear to auscultation and percussion with normal breathing effort HEART: regular rate & rhythm and no murmurs and no lower extremity edema ABDOMEN:abdomen soft, non-tender and normal bowel sounds Musculoskeletal:no cyanosis of digits and no clubbing  NEURO: alert & oriented x 3 with fluent speech, no focal motor/sensory deficits BREAST:all No palpable masses or nodules in either right or left breasts. No palpable axillary supraclavicular or infraclavicular adenopathy no breast tenderness or nipple discharge. (exam performed in the presence of a chaperone)  LABORATORY DATA:  I have reviewed the data as listed   Chemistry      Component Value Date/Time   NA 143 10/12/2014 1001   NA 141 08/08/2014 1133   K 3.9 10/12/2014 1001   K 4.3 08/08/2014 1133   CL 105 08/08/2014 1133   CO2 25 10/12/2014 1001   CO2 26 08/08/2014 1133   BUN 13.9 10/12/2014 1001   BUN 12 08/08/2014 1133   CREATININE 1.0 10/12/2014 1001   CREATININE 1.07 08/08/2014 1133   CREATININE 1.11* 04/19/2014 1310      Component Value Date/Time   CALCIUM 8.6 10/12/2014 1001   CALCIUM 9.6 08/08/2014 1133   ALKPHOS 102 10/12/2014 1001   ALKPHOS 103 08/08/2014 1133   AST 33 10/12/2014 1001   AST 14 08/08/2014 1133   ALT 27 10/12/2014 1001   ALT 9 08/08/2014 1133   BILITOT 0.25 10/12/2014 1001   BILITOT 0.3 08/08/2014 1133       Lab Results  Component Value Date   WBC 9.0 10/12/2014   HGB 12.4 10/12/2014   HCT 39.3 10/12/2014   MCV 82.2 10/12/2014   PLT 231 10/12/2014   NEUTROABS 5.6 10/12/2014     RADIOGRAPHIC STUDIES: I have personally reviewed the radiology reports and agreed with their findings. 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 Breast exam 10/12/2014 is normal 2. Mammogram 02/02/2014 was normal  Insect bite on the leg: instruct them to apply Neosporin ointment. Patient would like to follow-up with her primary care physician for the annual breast exams and follow-up on the mammograms. I think that is completely reasonable we will graduate her from the cancer clinic and she will come back to see [place] on an as-needed basis  No orders of the defined types were placed in this encounter.   The patient has a good understanding of the overall plan. she agrees with it. She will call with any problems that may develop before  her next visit here.   Rulon Eisenmenger, MD

## 2014-10-12 NOTE — Assessment & Plan Note (Addendum)
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 Breast exam 10/12/2014 is normal 2. Mammogram 02/02/2014 was normal  Insect bite on the leg: instruct them to apply Neosporin ointment. Continue once a year follow-up. We will switch her to survivorship clinic.

## 2014-10-30 ENCOUNTER — Other Ambulatory Visit: Payer: Self-pay

## 2014-10-30 MED ORDER — METOCLOPRAMIDE HCL 10 MG PO TABS: 10.0000 mg | ORAL_TABLET | Freq: Two times a day (BID) | ORAL | Status: DC

## 2014-11-02 ENCOUNTER — Encounter: Payer: Self-pay | Admitting: Internal Medicine

## 2014-11-02 ENCOUNTER — Ambulatory Visit (INDEPENDENT_AMBULATORY_CARE_PROVIDER_SITE_OTHER): Payer: Medicare Other | Admitting: Internal Medicine

## 2014-11-02 VITALS — BP 142/68 | HR 81 | Ht 59.0 in | Wt 180.6 lb

## 2014-11-02 DIAGNOSIS — I429 Cardiomyopathy, unspecified: Secondary | ICD-10-CM

## 2014-11-02 DIAGNOSIS — I428 Other cardiomyopathies: Secondary | ICD-10-CM

## 2014-11-02 DIAGNOSIS — I471 Supraventricular tachycardia: Secondary | ICD-10-CM

## 2014-11-02 NOTE — Patient Instructions (Signed)
Your physician wants you to follow-up in: 12 months with Dr Vallery Ridge will receive a reminder letter in the mail two months in advance. If you don't receive a letter, please call our office to schedule the follow-up appointment.  Your physician recommends that you continue on your current medications as directed. Please refer to the Current Medication list given to you today.

## 2014-11-02 NOTE — Progress Notes (Signed)
Cardiology Office Note   Date:  11/02/2014   ID:  Rozena, Fierro 08/12/1946, MRN [id number]  PCP:  [name], MD  Cardiologist:  Dr. Madolyn Frieze, PA-C   Chief Complaint  Patient presents with  . Follow-up    SVT     History of Present Illness: AVNEET ASHMORE is a 69 y.o. female who presents for yearly followup of her SVT.  69 yo female w/ hx SVT, HTN, HH, HLD, NICM (EF 45% echo 2014). Her general medical issues are managed by Dr. Moshe Cipro.  01/2013- Hospitalized with hypotension and SVT, short RP tachycardia, responsive to adenosine. Started on verapamil.  06/2013 - Seen by me, doing well. Echo w/ EF 45%.  04/2014 - Seen by L. Gerhardt, NP and had cough, no volume overload, no cardiac issues.  10/2014 - Here for followup. No palpitations, no chest pain. No LE edema. Has a knot on lateral mid-calf, has been there about a month, no drainage, has gotten smaller over time. No fevers, hurt at first, but no longer. GI symptoms controlled by Reglan and Prevacid. She is compliant with her medications. Would like to lose weight.    Past Medical History  Diagnosis Date  . H/O: hysterectomy   . History of cancer chemotherapy   . History of radiation therapy   . Depression   . GERD (gastroesophageal reflux disease)   . 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 Hiatal hernia   . Hyperlipidemia   . Nonischemic cardiomyopathy   . Breast cancer 2008    right - s/p lumpectomy->chemo, radiation  . Dysrhythmia     hx SVT  . Anxiety   . Hypertension     Past Surgical History  Procedure Laterality Date  . Total abdominal hysterectomy w/ bilateral salpingoophorectomy  1994  . Cholecystectomy  1999  . Abdominal hysterectomy  1994    fibroids,   . Breast surgery Right 2008    lumpectomy, cancer  . Colonoscopy  2008    Dr. Oneida Alar: multiple polyps. Path not available at time of visit.   . Colonoscopy with propofol N/A 04/25/2014      Procedure: COLONOSCOPY WITH PROPOFOL;  Surgeon: Danie Binder, MD;  Location: [facility];  Service: Endoscopy;  Laterality: N/A;  Cecum time in 1132     time out 1203 total time 31 minutes  . Polypectomy N/A 04/25/2014    Procedure: POLYPECTOMY;  Surgeon: Danie Binder, MD;  Location: [facility];  Service: Endoscopy;  Laterality: N/A;  Ascending and Decending Colon x3 , Transverse colon x2, rectal   Current Outpatient Prescriptions  Medication Sig Dispense Refill  . acetaminophen (TYLENOL) 325 MG tablet Take 650 mg by mouth every 8 (eight) hours as needed (pain).     Marland Kitchen alendronate (FOSAMAX) 70 MG tablet Take 1 tablet (70 mg total) by mouth every 7 (seven) days. Take with a full glass of water on an empty stomach. 12 tablet 1  . cholecalciferol (VITAMIN D) 1000 UNITS tablet Take 1,000 Units by mouth daily.    . clonazePAM (KLONOPIN) 0.5 MG tablet Take 1 tablet by mouth at bedtime.  1  . Cyanocobalamin (VITAMIN B-12 PO) Take 1 gummy by mouth daily (B12 GUMMY)    . docusate sodium (COLACE) 100 MG capsule Take 100 mg by mouth 2 (two) times daily.    . fexofenadine (ALLEGRA) 180 MG tablet Take 180 mg by mouth daily.    Marland Kitchen  lansoprazole (PREVACID) 30 MG capsule Take 1 capsule (30 mg total) by mouth daily. For acid reflux 90 capsule 1  . lovastatin (MEVACOR) 40 MG tablet Take 1 tablet (40 mg total) by mouth at bedtime. 90 tablet 1  . metoCLOPramide (REGLAN) 10 MG tablet Take 1 tablet (10 mg total) by mouth 2 (two) times daily. 180 tablet 1  . sertraline (ZOLOFT) 50 MG tablet Take 50 mg by mouth daily.     . verapamil (CALAN-SR) 120 MG CR tablet TAKE 1 TABLET BY MOUTH ONCE DAILY. 30 tablet 11  . zolpidem (AMBIEN) 10 MG tablet Take 10 mg by mouth at bedtime as needed for sleep.      No current facility-administered medications for this visit.    Allergies:   Aspirin   Social History:  The patient  reports that she has never smoked. She does not have any smokeless tobacco history on file. She reports that  she does not drink alcohol or use illicit drugs.   Family History:  The patient's family history includes Cancer in her father; Cancer (age of onset: 72) in her brother; Colon cancer (age of onset: 36) in her father; Hypertension in her sister.    ROS:  Please see the history of present illness.   Otherwise, review of systems are positive for none.   All other systems are reviewed and negative.    PHYSICAL EXAM: VS:  BP 142/68 mmHg  Pulse 81  Ht 4\' 11"  (1.499 m)  Wt 180 lb 9.6 oz (81.92 kg)  BMI 36.46 kg/m2 , BMI Body mass index is 36.46 kg/(m^2). GEN: Well nourished, well developed, in no acute distress HEENT: normal Neck: no JVD, carotid bruits, or masses Cardiac: RRR; soft murmur, no rubs, or gallops,no edema  Respiratory:  clear to auscultation bilaterally, normal work of breathing GI: soft, nontender, nondistended, + BS MS: no deformity or atrophy Skin: warm and dry, no rash. 1 cm nodular lesion right lateral calf, some discoloration, non-tender Neuro:  Strength and sensation are intact Psych: euthymic mood, full affect   EKG:  EKG is ordered today. The ekg ordered today demonstrates SR, no acute changes   Recent Labs: 01/31/2014: TSH 3.392 10/12/2014: ALT 27; BUN 13.9; Creatinine 1.0; Hemoglobin 12.4; Platelets 231; Potassium 3.9; Sodium 143    Lipid Panel    Component Value Date/Time   CHOL 180 08/08/2014 1133   TRIG 106 08/08/2014 1133   HDL 57 08/08/2014 1133   CHOLHDL 3.2 08/08/2014 1133   VLDL 21 08/08/2014 1133   LDLCALC 102* 08/08/2014 1133      Wt Readings from Last 3 Encounters:  11/02/14 180 lb 9.6 oz (81.92 kg)  10/12/14 180 lb 6.4 oz (81.829 kg)  08/08/14 177 lb 1.9 oz (80.341 kg)      Other studies Reviewed: Additional studies/ records that were reviewed today include: Dr. Griffin Dakin last office visit w/ VS, old cardiac office notes. Review of the above records demonstrates: maintaining SR, weight stable   ASSESSMENT AND PLAN:  1.  PSVT  (paroxysmal supraventricular tachycardia) - no palpitations, SR on ECG - Compliant w/ Verpamil, no sig S.E. - Continue current rx, f/u in 1 year  2. NICM (nonischemic cardiomyopathy) - EF 45% in 2014 - no S/S volume overload at this time.  - BP somewhat elevated today, pt states this is not generally the case - BP normal last OV w/ Dr. Moshe Cipro - no medication changes, check BP at home and f/u with Dr Moshe Cipro  - recheck  echo at next OV in 1 year.  Current medicines are reviewed at length with the patient today.  The patient does not have concerns regarding medicines.   Return to see me in 1 year

## 2014-11-02 NOTE — Progress Notes (Deleted)
Cardiology Office Note   Date:  11/02/2014   ID:  Zoii, Florer 09-05-1946, MRN [id number]  PCP:  [name], MD  Cardiologist:  Dr. Madolyn Frieze, PA-C   Chief Complaint  Patient presents with  . Follow-up    SVT     History of Present Illness: Tara [name] is a 69 y.o. female who presents for yearly followup of her SVT.  69 yo female w/ hx SVT, HTN, HH, HLD, NICM (EF 45% echo 2014). Her general medical issues are managed by Dr. Moshe Cipro.  01/2013- Hospitalized with hypotension and SVT, short RP tachycardia, responsive to adenosine. Started on verapamil.  06/2013 - Seen by Dr. Rayann Heman, doing well. Echo w/ EF 45%.  04/2014 - Seen by L. Gerhardt, NP and had cough, no volume overload, no cardiac issues.  10/2014 - Here for followup. No palpitations, no chest pain. No LE edema. Has a knot on lateral mid-calf, has been there about a month, no drainage, has gotten smaller over time. No fevers, hurt at first, but no longer. GI symptoms controlled by Reglan and Prevacid. She is compliant with her medications. Would like to lose weight.    Past Medical History  Diagnosis Date  . H/O: hysterectomy   . History of cancer chemotherapy   . History of radiation therapy   . Depression   . GERD (gastroesophageal reflux disease)   . 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 Hiatal hernia   . Hyperlipidemia   . Nonischemic cardiomyopathy   . Breast cancer 2008    right - s/p lumpectomy->chemo, radiation  . Dysrhythmia     hx SVT  . Anxiety   . Hypertension     Past Surgical History  Procedure Laterality Date  . Total abdominal hysterectomy w/ bilateral salpingoophorectomy  1994  . Cholecystectomy  1999  . Abdominal hysterectomy  1994    fibroids,   . Breast surgery Right 2008    lumpectomy, cancer  . Colonoscopy  2008    Dr. Oneida Alar: multiple polyps. Path not available at time of visit.   . Colonoscopy with propofol N/A  04/25/2014    Procedure: COLONOSCOPY WITH PROPOFOL;  Surgeon: Danie Binder, MD;  Location: [facility];  Service: Endoscopy;  Laterality: N/A;  Cecum time in 1132     time out 1203 total time 31 minutes  . Polypectomy N/A 04/25/2014    Procedure: POLYPECTOMY;  Surgeon: Danie Binder, MD;  Location: [facility];  Service: Endoscopy;  Laterality: N/A;  Ascending and Decending Colon x3 , Transverse colon x2, rectal   Current Outpatient Prescriptions  Medication Sig Dispense Refill  . acetaminophen (TYLENOL) 325 MG tablet Take 650 mg by mouth every 8 (eight) hours as needed (pain).     Marland Kitchen alendronate (FOSAMAX) 70 MG tablet Take 1 tablet (70 mg total) by mouth every 7 (seven) days. Take with a full glass of water on an empty stomach. 12 tablet 1  . cholecalciferol (VITAMIN D) 1000 UNITS tablet Take 1,000 Units by mouth daily.    . clonazePAM (KLONOPIN) 0.5 MG tablet Take 1 tablet by mouth at bedtime.  1  . Cyanocobalamin (VITAMIN B-12 PO) Take 1 gummy by mouth daily (B12 GUMMY)    . docusate sodium (COLACE) 100 MG capsule Take 100 mg by mouth 2 (two) times daily.    . fexofenadine (ALLEGRA) 180 MG tablet Take 180 mg by mouth daily.    Marland Kitchen  lansoprazole (PREVACID) 30 MG capsule Take 1 capsule (30 mg total) by mouth daily. For acid reflux 90 capsule 1  . lovastatin (MEVACOR) 40 MG tablet Take 1 tablet (40 mg total) by mouth at bedtime. 90 tablet 1  . metoCLOPramide (REGLAN) 10 MG tablet Take 1 tablet (10 mg total) by mouth 2 (two) times daily. 180 tablet 1  . sertraline (ZOLOFT) 50 MG tablet Take 50 mg by mouth daily.     . verapamil (CALAN-SR) 120 MG CR tablet TAKE 1 TABLET BY MOUTH ONCE DAILY. 30 tablet 11  . zolpidem (AMBIEN) 10 MG tablet Take 10 mg by mouth at bedtime as needed for sleep.      No current facility-administered medications for this visit.    Allergies:   Aspirin   Social History:  The patient  reports that she has never smoked. She does not have any smokeless tobacco history on file. She  reports that she does not drink alcohol or use illicit drugs.   Family History:  The patient's family history includes Cancer in her father; Cancer (age of onset: 46) in her brother; Colon cancer (age of onset: 32) in her father; Hypertension in her sister.    ROS:  Please see the history of present illness.   Otherwise, review of systems are positive for none.   All other systems are reviewed and negative.    PHYSICAL EXAM: VS:  BP 142/68 mmHg  Pulse 81  Ht 4\' 11"  (1.499 m)  Wt 180 lb 9.6 oz (81.92 kg)  BMI 36.46 kg/m2 , BMI Body mass index is 36.46 kg/(m^2). GEN: Well nourished, well developed, in no acute distress HEENT: normal Neck: no JVD, carotid bruits, or masses Cardiac: RRR; soft murmur, no rubs, or gallops,no edema  Respiratory:  clear to auscultation bilaterally, normal work of breathing GI: soft, nontender, nondistended, + BS MS: no deformity or atrophy Skin: warm and dry, no rash. 1 cm nodular lesion right lateral calf, some discoloration, non-tender Neuro:  Strength and sensation are intact Psych: euthymic mood, full affect   EKG:  EKG is ordered today. The ekg ordered today demonstrates SR, no acute changes   Recent Labs: 01/31/2014: TSH 3.392 10/12/2014: ALT 27; BUN 13.9; Creatinine 1.0; Hemoglobin 12.4; Platelets 231; Potassium 3.9; Sodium 143    Lipid Panel    Component Value Date/Time   CHOL 180 08/08/2014 1133   TRIG 106 08/08/2014 1133   HDL 57 08/08/2014 1133   CHOLHDL 3.2 08/08/2014 1133   VLDL 21 08/08/2014 1133   LDLCALC 102* 08/08/2014 1133      Wt Readings from Last 3 Encounters:  11/02/14 180 lb 9.6 oz (81.92 kg)  10/12/14 180 lb 6.4 oz (81.829 kg)  08/08/14 177 lb 1.9 oz (80.341 kg)      Other studies Reviewed: Additional studies/ records that were reviewed today include: Dr. Griffin Dakin last office visit w/ VS, old cardiac office notes. Review of the above records demonstrates: maintaining SR, weight stable   ASSESSMENT AND  PLAN:  1.  PSVT (paroxysmal supraventricular tachycardia) - no palpitations, SR on ECG - Compliant w/ Verpamil, no sig S.E. - Continue current rx, f/u in 1 year  2. NICM (nonischemic cardiomyopathy) - EF 45% in 2014 - no S/S volume overload at this time.  - BP somewhat elevated today, pt states this is not generally the case - BP normal last OV w/ Dr. Moshe Cipro - no medication changes, check BP at home and f/u with Dr Moshe Cipro  - recheck  echo at next OV in 1 year.  Current medicines are reviewed at length with the patient today.  The patient does not have concerns regarding medicines.  The following changes have been made:  no change  Labs/ tests ordered today include: None  No orders of the defined types were placed in this encounter.     Disposition:   FU with Dr. Rayann Heman in 1 year.   Jonetta Speak, PA-C  11/02/2014 11:57 AM    [facility] [place], [place],   [address] Phone: [phone]; Fax: [phone]

## 2014-12-11 ENCOUNTER — Ambulatory Visit (INDEPENDENT_AMBULATORY_CARE_PROVIDER_SITE_OTHER): Payer: Medicare Other | Admitting: Family Medicine

## 2014-12-11 ENCOUNTER — Encounter: Payer: Self-pay | Admitting: Family Medicine

## 2014-12-11 VITALS — BP 138/74 | HR 86 | Resp 16 | Ht 59.0 in | Wt 180.0 lb

## 2014-12-11 DIAGNOSIS — E559 Vitamin D deficiency, unspecified: Secondary | ICD-10-CM

## 2014-12-11 DIAGNOSIS — E785 Hyperlipidemia, unspecified: Secondary | ICD-10-CM

## 2014-12-11 DIAGNOSIS — Z0000 Encounter for general adult medical examination without abnormal findings: Secondary | ICD-10-CM

## 2014-12-11 DIAGNOSIS — Z23 Encounter for immunization: Secondary | ICD-10-CM

## 2014-12-11 DIAGNOSIS — R7303 Prediabetes: Secondary | ICD-10-CM

## 2014-12-11 DIAGNOSIS — Z800 Family history of malignant neoplasm of digestive organs: Secondary | ICD-10-CM

## 2014-12-11 DIAGNOSIS — H547 Unspecified visual loss: Secondary | ICD-10-CM | POA: Insufficient documentation

## 2014-12-11 DIAGNOSIS — R7309 Other abnormal glucose: Secondary | ICD-10-CM

## 2014-12-11 LAB — COMPLETE METABOLIC PANEL WITH GFR
ALT: 10 U/L (ref 0–35)
AST: 13 U/L (ref 0–37)
Albumin: 3.9 g/dL (ref 3.5–5.2)
Alkaline Phosphatase: 99 U/L (ref 39–117)
BILIRUBIN TOTAL: 0.3 mg/dL (ref 0.2–1.2)
BUN: 16 mg/dL (ref 6–23)
CO2: 24 mEq/L (ref 19–32)
Calcium: 9.3 mg/dL (ref 8.4–10.5)
Chloride: 105 mEq/L (ref 96–112)
Creat: 1.07 mg/dL (ref 0.50–1.10)
GFR, EST NON AFRICAN AMERICAN: 53 mL/min — AB
GFR, Est African American: 61 mL/min
GLUCOSE: 101 mg/dL — AB (ref 70–99)
Potassium: 3.9 mEq/L (ref 3.5–5.3)
SODIUM: 140 meq/L (ref 135–145)
TOTAL PROTEIN: 7.2 g/dL (ref 6.0–8.3)

## 2014-12-11 LAB — LIPID PANEL
CHOL/HDL RATIO: 3.3 ratio
CHOLESTEROL: 175 mg/dL (ref 0–200)
HDL: 53 mg/dL (ref >=46)
LDL Cholesterol: 103 mg/dL — ABNORMAL HIGH (ref 0–99)
Triglycerides: 97 mg/dL (ref <150)
VLDL: 19 mg/dL (ref 0–40)

## 2014-12-11 LAB — HEMOGLOBIN A1C
HEMOGLOBIN A1C: 6.1 % — AB (ref <5.7)
MEAN PLASMA GLUCOSE: 128 mg/dL — AB (ref <117)

## 2014-12-11 NOTE — Assessment & Plan Note (Signed)

## 2014-12-11 NOTE — Patient Instructions (Signed)
F/u in 4 months, call if you need me before  Pneumonia vaccine today, prevnar  You are referred for eye exam  Commit to increased exercise as discussed and change in eating habits  Plan is to lose 1.5 to 2 pounds per month  You are referred to nutrtionist also  Labs today lipid, cmp and EGFr, HBA1C, Vit D  today

## 2014-12-11 NOTE — Progress Notes (Signed)
Subjective:    Patient ID: Tara [name], female    DOB: 1946/03/30, 69 y.o.   MRN: [id number]  HPI  Preventive Screening-Counseling & Management   Patient present here today for a subsequent Medicare annual wellness visit.   Current Problems (verified)   Medications Prior to Visit Allergies (verified)   PAST HISTORY  Family History (verified)   Social History married for 68 years, 2 children, retired Marine scientist    Risk Factors  Current exercise habits:  Has to walk up and down stairs in her house. Likes to walk when the weather is warmer   Dietary issues discussed: Heart healthy diet discussed, eating more fruits and vegetables and limiting red meat and fried and fatty foods    Cardiac risk factors:   Depression Screen  (Note: if answer to either of the following is "Yes", a more complete depression screening is indicated)   Over the past two weeks, have you felt down, depressed or hopeless? No  Over the past two weeks, have you felt little interest or pleasure in doing things? No  Have you lost interest or pleasure in daily life? No  Do you often feel hopeless? No  Do you cry easily over simple problems? No   Activities of Daily Living  In your present state of health, do you have any difficulty performing the following activities?  Driving?: No, doesn't drive at night  Managing money?: No Feeding yourself?:No Getting from bed to chair?:No Climbing a flight of stairs?:No Preparing food and eating?:No Bathing or showering?:No Getting dressed?:No Getting to the toilet?:No Using the toilet?:No Moving around from place to place?: No  Fall Risk Assessment In the past year have you fallen or had a near fall?:No Are you currently taking any medications that make you dizzy?:No   Hearing Difficulties: No Do you often ask people to speak up or repeat themselves?:No Do you experience ringing or noises in your ears?:No Do you have difficulty understanding soft or  whispered voices?:No  Cognitive Testing  Alert? Yes Normal Appearance?Yes  Oriented to person? Yes Place? Yes  Time? Yes  Displays appropriate judgment?Yes  Can read the correct time from a watch face? yes Are you having problems remembering things?No  Advanced Directives have been discussed with the patient?Yes, brochure given     List the Names of Other Physician/Practitioners you currently use:  Dr Jeneen Rinks allred (cardio) Dr Humphrey Rolls (oncologist)   Indicate any recent Medical Services you may have received from other than [facility] providers in the past year (date may be approximate).   Assessment:    Annual Wellness Exam   Plan:    .  Medicare Attestation  I have personally reviewed:  The patient's medical and social history  Their use of alcohol, tobacco or illicit drugs  Their current medications and supplements  The patient's functional ability including ADLs,fall risks, home safety risks, cognitive, and hearing and visual impairment  Diet and physical activities  Evidence for depression or mood disorders  The patient's weight, height, BMI, and visual acuity have been recorded in the chart. I have made referrals, counseling, and provided education to the patient based on review of the above and I have provided the patient with a written personalized care plan for preventive services.     Review of Systems     Objective:   Physical Exam BP 138/74 mmHg  Pulse 86  Resp 16  Ht 4\' 11"  (1.499 m)  Wt 180 lb (81.647 kg)  BMI 36.34 kg/m2  SpO2 94%        Assessment & Plan:  Medicare annual wellness visit, subsequent Annual exam as documented. Counseling done  re healthy lifestyle involving commitment to 150 minutes exercise per week, heart healthy diet, and attaining healthy weight.The importance of adequate sleep also discussed. Regular seat belt use and home safety, is also discussed. Changes in health habits are decided on by the patient with goals and time frames   set for achieving them. Immunization and cancer screening needs are specifically addressed at this visit.    Need for vaccination with 13-polyvalent pneumococcal conjugate vaccine After obtaining informed consent, the vaccine is  administered by LPN.

## 2014-12-11 NOTE — Assessment & Plan Note (Signed)
After obtaining informed consent, the vaccine is  administered by LPN.  

## 2014-12-12 LAB — VITAMIN D 25 HYDROXY (VIT D DEFICIENCY, FRACTURES): Vit D, 25-Hydroxy: 38 ng/mL (ref 30–100)

## 2014-12-14 ENCOUNTER — Other Ambulatory Visit: Payer: Self-pay

## 2014-12-14 DIAGNOSIS — E785 Hyperlipidemia, unspecified: Secondary | ICD-10-CM

## 2014-12-14 DIAGNOSIS — E8881 Metabolic syndrome: Secondary | ICD-10-CM

## 2014-12-14 DIAGNOSIS — R7303 Prediabetes: Secondary | ICD-10-CM

## 2014-12-18 ENCOUNTER — Other Ambulatory Visit: Payer: Self-pay

## 2014-12-18 MED ORDER — LANSOPRAZOLE 30 MG PO CPDR: 30.0000 mg | DELAYED_RELEASE_CAPSULE | Freq: Every day | ORAL | Status: DC

## 2014-12-18 MED ORDER — ALENDRONATE SODIUM 70 MG PO TABS: 70.0000 mg | ORAL_TABLET | ORAL | Status: DC

## 2014-12-18 MED ORDER — LOVASTATIN 40 MG PO TABS: 40.0000 mg | ORAL_TABLET | Freq: Every day | ORAL | Status: DC

## 2015-01-02 ENCOUNTER — Other Ambulatory Visit: Payer: Self-pay

## 2015-01-02 DIAGNOSIS — Z1231 Encounter for screening mammogram for malignant neoplasm of breast: Secondary | ICD-10-CM

## 2015-01-08 ENCOUNTER — Telehealth: Payer: Self-pay

## 2015-01-08 NOTE — Telephone Encounter (Signed)
Insurance no longer covers prevacid 30mg . Ok to change to omeprazole or pantoprazole? Wants sent to mail order

## 2015-01-08 NOTE — Telephone Encounter (Signed)
pls change to protonix 40mg  one daily let her know

## 2015-01-09 ENCOUNTER — Other Ambulatory Visit: Payer: Self-pay

## 2015-01-09 MED ORDER — PANTOPRAZOLE SODIUM 40 MG PO TBEC: 40.0000 mg | DELAYED_RELEASE_TABLET | Freq: Every day | ORAL | Status: DC

## 2015-01-09 NOTE — Telephone Encounter (Signed)
Pt aware and med sent  

## 2015-01-11 ENCOUNTER — Telehealth: Payer: Self-pay | Admitting: Family Medicine

## 2015-01-11 MED ORDER — ZOLPIDEM TARTRATE 10 MG PO TABS: 10.0000 mg | ORAL_TABLET | Freq: Every evening | ORAL | Status: DC | PRN

## 2015-01-11 MED ORDER — CLONAZEPAM 0.5 MG PO TABS: 0.5000 mg | ORAL_TABLET | Freq: Every day | ORAL | Status: DC

## 2015-01-11 MED ORDER — SERTRALINE HCL 50 MG PO TABS: 50.0000 mg | ORAL_TABLET | Freq: Every day | ORAL | Status: DC

## 2015-01-11 NOTE — Telephone Encounter (Signed)
Pt aware meds sent

## 2015-01-23 ENCOUNTER — Telehealth: Payer: Self-pay | Admitting: *Deleted

## 2015-01-23 NOTE — Telephone Encounter (Signed)
Pt called [facility] during lunch stating that [employer] called her to let her know they will not cover her medication. Please advise [phone]

## 2015-01-23 NOTE — Telephone Encounter (Signed)
Patient aware that the PA has been approved

## 2015-02-05 ENCOUNTER — Ambulatory Visit
Admission: RE | Admit: 2015-02-05 | Discharge: 2015-02-05 | Disposition: A | Discharge status: Home / Self Care | Payer: Medicare Other | Source: Ambulatory Visit

## 2015-02-05 DIAGNOSIS — Z1231 Encounter for screening mammogram for malignant neoplasm of breast: Secondary | ICD-10-CM

## 2015-04-12 ENCOUNTER — Encounter: Payer: Self-pay | Admitting: Family Medicine

## 2015-04-12 ENCOUNTER — Ambulatory Visit (INDEPENDENT_AMBULATORY_CARE_PROVIDER_SITE_OTHER): Payer: Medicare Other | Admitting: Family Medicine

## 2015-04-12 VITALS — BP 120/78 | HR 81 | Resp 16 | Ht 59.0 in | Wt 176.1 lb

## 2015-04-12 DIAGNOSIS — I471 Supraventricular tachycardia: Secondary | ICD-10-CM

## 2015-04-12 DIAGNOSIS — B359 Dermatophytosis, unspecified: Secondary | ICD-10-CM | POA: Diagnosis not present

## 2015-04-12 DIAGNOSIS — M810 Age-related osteoporosis without current pathological fracture: Secondary | ICD-10-CM

## 2015-04-12 DIAGNOSIS — R7303 Prediabetes: Secondary | ICD-10-CM

## 2015-04-12 DIAGNOSIS — IMO0001 Reserved for inherently not codable concepts without codable children: Secondary | ICD-10-CM

## 2015-04-12 DIAGNOSIS — E785 Hyperlipidemia, unspecified: Secondary | ICD-10-CM

## 2015-04-12 DIAGNOSIS — R7309 Other abnormal glucose: Secondary | ICD-10-CM

## 2015-04-12 DIAGNOSIS — K219 Gastro-esophageal reflux disease without esophagitis: Secondary | ICD-10-CM

## 2015-04-12 MED ORDER — CLOTRIMAZOLE-BETAMETHASONE 1-0.05 % EX CREA: 1.0000 "application " | TOPICAL_CREAM | Freq: Two times a day (BID) | CUTANEOUS | Status: DC

## 2015-04-12 NOTE — Assessment & Plan Note (Signed)
Rash on right elbow x 10 days

## 2015-04-12 NOTE — Patient Instructions (Signed)
Annual physical exam end November or after, call if you need me before  Cream sent for rash on right arm  Congrats on lifestyle change , keep this up, you are working on preventing dioabetes  Labs today   You have lost 4 pounds

## 2015-04-13 LAB — COMPREHENSIVE METABOLIC PANEL
ALT: 11 U/L (ref 0–35)
AST: 15 U/L (ref 0–37)
Albumin: 3.6 g/dL (ref 3.5–5.2)
Alkaline Phosphatase: 87 U/L (ref 39–117)
BUN: 11 mg/dL (ref 6–23)
CO2: 22 mEq/L (ref 19–32)
Calcium: 8.9 mg/dL (ref 8.4–10.5)
Chloride: 108 mEq/L (ref 96–112)
Creat: 1.14 mg/dL — ABNORMAL HIGH (ref 0.50–1.10)
GLUCOSE: 84 mg/dL (ref 70–99)
POTASSIUM: 4 meq/L (ref 3.5–5.3)
SODIUM: 143 meq/L (ref 135–145)
TOTAL PROTEIN: 7.1 g/dL (ref 6.0–8.3)
Total Bilirubin: 0.5 mg/dL (ref 0.2–1.2)

## 2015-04-13 LAB — HEMOGLOBIN A1C
HEMOGLOBIN A1C: 6 % — AB (ref <5.7)
Mean Plasma Glucose: 126 mg/dL — ABNORMAL HIGH (ref <117)

## 2015-04-13 LAB — LIPID PANEL
CHOL/HDL RATIO: 3.3 ratio
Cholesterol: 182 mg/dL (ref 0–200)
HDL: 56 mg/dL (ref >=46)
LDL CALC: 104 mg/dL — AB (ref 0–99)
Triglycerides: 110 mg/dL (ref <150)
VLDL: 22 mg/dL (ref 0–40)

## 2015-04-24 ENCOUNTER — Other Ambulatory Visit: Payer: Self-pay

## 2015-04-24 MED ORDER — ALENDRONATE SODIUM 70 MG PO TABS: 70.0000 mg | ORAL_TABLET | ORAL | Status: DC

## 2015-04-24 MED ORDER — METOCLOPRAMIDE HCL 10 MG PO TABS: 10.0000 mg | ORAL_TABLET | Freq: Two times a day (BID) | ORAL | Status: DC

## 2015-05-05 NOTE — Assessment & Plan Note (Signed)
Controlled, no change in medication  

## 2015-05-05 NOTE — Assessment & Plan Note (Signed)
Improved Patient educated about the importance of limiting  Carbohydrate intake , the need to commit to daily physical activity for a minimum of 30 minutes , and to commit weight loss. The fact that changes in all these areas will reduce or eliminate all together the development of diabetes is stressed.   Diabetic Labs Latest Ref Rng 04/12/2015 12/11/2014 10/12/2014 08/08/2014 04/19/2014  HbA1c <5.7 % 6.0(H) 6.1(H) - 6.0(H) -  Chol 0 - 200 mg/dL 182 175 - 180 -  HDL >=46 mg/dL 56 53 - 57 -  Calc LDL 0 - 99 mg/dL 104(H) 103(H) - 102(H) -  Triglycerides <150 mg/dL 110 97 - 106 -  Creatinine 0.50 - 1.10 mg/dL 1.14(H) 1.07 1.0 1.07 1.11(H)  GFR >60.00 mL/min - - - - -   BP/Weight 04/12/2015 12/11/2014 11/02/2014 10/12/2014 08/08/2014 01/23/85 03/27/1949  Systolic BP 932 671 245 809 983 382 505  Diastolic BP 78 74 68 73 84 80 69  Wt. (Lbs) 176.12 180 180.6 180.4 177.12 174.12 -  BMI 35.55 36.34 36.46 39.03 38.32 37.67 -   No flowsheet data found.

## 2015-05-05 NOTE — Assessment & Plan Note (Signed)
Controlled, no change in medication Followed annually by cardiology also

## 2015-05-05 NOTE — Assessment & Plan Note (Signed)
Improved. Patient re-educated about  the importance of commitment to a  minimum of 150 minutes of exercise per week.  The importance of healthy food choices with portion control discussed. Encouraged to start a food diary, count calories and to consider  joining a support group. Sample diet sheets offered. Goals set by the patient for the next several months.   Weight /BMI 04/12/2015 12/11/2014 11/02/2014  WEIGHT 176 lb 1.9 oz 180 lb 180 lb 9.6 oz  HEIGHT 4\' 11"  4\' 11"  4\' 11"   BMI 35.55 kg/m2 36.34 kg/m2 36.46 kg/m2    Current exercise per week 90 minutes.

## 2015-05-05 NOTE — Assessment & Plan Note (Signed)
UnControlled, no change in medication, needs to reduce fat intake  Hyperlipidemia:Low fat diet discussed and encouraged.   Lipid Panel  Lab Results  Component Value Date   CHOL 182 04/12/2015   HDL 56 04/12/2015   LDLCALC 104* 04/12/2015   TRIG 110 04/12/2015   CHOLHDL 3.3 04/12/2015

## 2015-05-05 NOTE — Progress Notes (Signed)
JESS SULAK     MRN: [id number]      DOB: [birth year]   HPI Ms. Shimada is here for follow up and re-evaluation of chronic medical conditions, medication management and review of any available recent lab and radiology data.  Preventive health is updated, specifically  Cancer screening and Immunization.   Questions or concerns regarding consultations or procedures which the PT has had in the interim are  addressed. The PT denies any adverse reactions to current medications since the last visit.  C/o itchy rash on forearm for past 10 days She ahs been working on lifestyle change with success  ROS Denies recent fever or chills. Denies sinus pressure, nasal congestion, ear pain or sore throat. Denies chest congestion, productive cough or wheezing. Denies chest pains, palpitations and leg swelling Denies abdominal pain, nausea, vomiting,diarrhea or constipation.   Denies dysuria, frequency, hesitancy or incontinence. Denies joint pain, swelling and limitation in mobility. Denies headaches, seizures, numbness, or tingling. Denies uncontrolled  depression, anxiety or insomnia.   PE  BP 120/78 mmHg  Pulse 81  Resp 16  Ht 4\' 11"  (1.499 m)  Wt 176 lb 1.9 oz (79.888 kg)  BMI 35.55 kg/m2  SpO2 97%  Patient alert and oriented and in no cardiopulmonary distress.  HEENT: No facial asymmetry, EOMI,   oropharynx pink and moist.  Neck supple no JVD, no mass.  Chest: Clear to auscultation bilaterally.  CVS: S1, S2 no murmurs, no S3.Regular rate.  ABD: Soft non tender.   Ext: No edema  MS: Adequate ROM spine, shoulders, hips and knees.  Skin: Intact, fungal rash on forearm Psych: Good eye contact, normal affect. Memory intact not anxious or depressed appearing.  CNS: CN 2-12 intact, power,  normal throughout.no focal deficits noted.   Assessment & Plan   Tinea Rash on right elbow x 10 days  SVT (supraventricular tachycardia) Controlled, no change in medication Followed  annually by cardiology also  Prediabetes Improved Patient educated about the importance of limiting  Carbohydrate intake , the need to commit to daily physical activity for a minimum of 30 minutes , and to commit weight loss. The fact that changes in all these areas will reduce or eliminate all together the development of diabetes is stressed.   Diabetic Labs Latest Ref Rng 04/12/2015 12/11/2014 10/12/2014 08/08/2014 04/19/2014  HbA1c <5.7 % 6.0(H) 6.1(H) - 6.0(H) -  Chol 0 - 200 mg/dL 182 175 - 180 -  HDL >=46 mg/dL 56 53 - 57 -  Calc LDL 0 - 99 mg/dL 104(H) 103(H) - 102(H) -  Triglycerides <150 mg/dL 110 97 - 106 -  Creatinine 0.50 - 1.10 mg/dL 1.14(H) 1.07 1.0 1.07 1.11(H)  GFR >60.00 mL/min - - - - -   BP/Weight 04/12/2015 12/11/2014 11/02/2014 10/12/2014 08/08/2014 05/29/2835 02/22/9475  Systolic BP 546 503 546 568 127 517 001  Diastolic BP 78 74 68 73 84 80 69  Wt. (Lbs) 176.12 180 180.6 180.4 177.12 174.12 -  BMI 35.55 36.34 36.46 39.03 38.32 37.67 -   No flowsheet data found.     Hyperlipidemia UnControlled, no change in medication, needs to reduce fat intake  Hyperlipidemia:Low fat diet discussed and encouraged.   Lipid Panel  Lab Results  Component Value Date   CHOL 182 04/12/2015   HDL 56 04/12/2015   LDLCALC 104* 04/12/2015   TRIG 110 04/12/2015   CHOLHDL 3.3 04/12/2015        Osteoporosis Continue current management Daily weight bearing exercise also encouraged  GERD Controlled, no change in medication   Obesity, Class II, BMI 35-39.9, with comorbidity Improved. Patient re-educated about  the importance of commitment to a  minimum of 150 minutes of exercise per week.  The importance of healthy food choices with portion control discussed. Encouraged to start a food diary, count calories and to consider  joining a support group. Sample diet sheets offered. Goals set by the patient for the next several months.   Weight /BMI 04/12/2015 12/11/2014 11/02/2014    WEIGHT 176 lb 1.9 oz 180 lb 180 lb 9.6 oz  HEIGHT 4\' 11"  4\' 11"  4\' 11"   BMI 35.55 kg/m2 36.34 kg/m2 36.46 kg/m2    Current exercise per week 90 minutes.   DEPRESSION Controlled, no change in medication

## 2015-05-05 NOTE — Assessment & Plan Note (Signed)
Continue current management Daily weight bearing exercise also encouraged

## 2015-05-17 ENCOUNTER — Other Ambulatory Visit: Payer: Self-pay

## 2015-05-17 MED ORDER — ZOLPIDEM TARTRATE 10 MG PO TABS: 10.0000 mg | ORAL_TABLET | Freq: Every evening | ORAL | Status: DC | PRN

## 2015-05-17 MED ORDER — SERTRALINE HCL 50 MG PO TABS: 50.0000 mg | ORAL_TABLET | Freq: Every day | ORAL | Status: DC

## 2015-05-17 MED ORDER — CLONAZEPAM 0.5 MG PO TABS: 0.5000 mg | ORAL_TABLET | Freq: Every day | ORAL | Status: DC

## 2015-06-25 ENCOUNTER — Telehealth: Payer: Self-pay

## 2015-06-25 ENCOUNTER — Ambulatory Visit (INDEPENDENT_AMBULATORY_CARE_PROVIDER_SITE_OTHER): Payer: Medicare Other | Admitting: Family Medicine

## 2015-06-25 ENCOUNTER — Encounter: Payer: Self-pay | Admitting: Family Medicine

## 2015-06-25 ENCOUNTER — Other Ambulatory Visit: Payer: Self-pay

## 2015-06-25 VITALS — BP 120/82 | HR 83 | Temp 98.6°F | Resp 16 | Ht 59.0 in | Wt 180.4 lb

## 2015-06-25 DIAGNOSIS — J0100 Acute maxillary sinusitis, unspecified: Secondary | ICD-10-CM | POA: Diagnosis not present

## 2015-06-25 DIAGNOSIS — IMO0001 Reserved for inherently not codable concepts without codable children: Secondary | ICD-10-CM

## 2015-06-25 DIAGNOSIS — J302 Other seasonal allergic rhinitis: Secondary | ICD-10-CM | POA: Diagnosis not present

## 2015-06-25 MED ORDER — METHYLPREDNISOLONE ACETATE 80 MG/ML IJ SUSP
80.0000 mg | Freq: Once | INTRAMUSCULAR | Status: AC
  Administered 2015-06-25: 80 mg via INTRAMUSCULAR

## 2015-06-25 MED ORDER — PROMETHAZINE-DM 6.25-15 MG/5ML PO SYRP: 5.0000 mL | ORAL_SOLUTION | Freq: Every evening | ORAL | Status: DC | PRN

## 2015-06-25 MED ORDER — BENZONATATE 100 MG PO CAPS: 100.0000 mg | ORAL_CAPSULE | Freq: Two times a day (BID) | ORAL | Status: DC | PRN

## 2015-06-25 MED ORDER — PREDNISONE 5 MG PO TABS: 5.0000 mg | ORAL_TABLET | Freq: Two times a day (BID) | ORAL | Status: DC

## 2015-06-25 MED ORDER — AZITHROMYCIN 250 MG PO TABS: ORAL_TABLET | ORAL | Status: DC

## 2015-06-25 NOTE — Telephone Encounter (Signed)
Call her to come in this am please

## 2015-06-25 NOTE — Patient Instructions (Signed)
Annual physical exam in January as before  Call and come in for flu vaccine in next 3 to 4 weeks please  You ar treated for acute right maxillary sinusitis  And uncontrolled allergies  Depo medrol given in office, and z pack, tessalon perles, prednisone for 5 days and cough suppressant , phenergan dm for bedtime use are prescribed  Hope that you recover soon  Thanks for choosing [facility], we consider it a privelige to serve you.

## 2015-06-25 NOTE — Telephone Encounter (Signed)
Patient is coming in this morning at 9:45 and patient is aware.

## 2015-06-25 NOTE — Assessment & Plan Note (Addendum)
Uncontrolled, depo medrol in office , short course of steroids prescribed also phenergan DM syrup

## 2015-06-25 NOTE — Assessment & Plan Note (Signed)
Deteriorated. Patient re-educated about  the importance of commitment to a  minimum of 150 minutes of exercise per week.  The importance of healthy food choices with portion control discussed. Encouraged to start a food diary, count calories and to consider  joining a support group. Sample diet sheets offered. Goals set by the patient for the next several months.   Weight /BMI 06/25/2015 04/12/2015 12/11/2014  WEIGHT 180 lb 6.4 oz 176 lb 1.9 oz 180 lb  HEIGHT 4\' 11"  4\' 11"  4\' 11"   BMI 36.42 kg/m2 35.55 kg/m2 36.34 kg/m2    Current exercise per week 90 minutes.

## 2015-06-25 NOTE — Progress Notes (Signed)
   Subjective:    Patient ID: Tara [name], female    DOB: [birth year], 69 y.o.   MRN: [id number]  HPI 1 week h/o increased sinus pressure , right cheek, sneezing and has had body aches and chills for past 2 days. Coughed excessively last night, sputum is mainly clear, nasal drainage is getting thicker and a bit cream. Bilateral ear pressure and scratchy throat  Review of Systems See HPI  Denies chest pains, palpitations and leg swelling Denies abdominal pain, nausea, vomiting,diarrhea or constipation.   Denies dysuria, frequency, hesitancy or incontinence. Denies joint pain, swelling and limitation in mobility. Denies headaches, seizures, numbness, or tingling. Denies depression, anxiety or insomnia. Denies skin break down or rash.          Objective:   Physical Exam  BP 120/82 mmHg  Pulse 83  Temp(Src) 98.6 F (37 C) (Oral)  Resp 16  Ht 4\' 11"  (1.499 m)  Wt 180 lb 6.4 oz (81.829 kg)  BMI 36.42 kg/m2 Patient alert and oriented and in no cardiopulmonary distress.  HEENT: No facial asymmetry, EOMI,   oropharynx pink and moist.  Neck supple no JVD, no mass. Right maxillary sinus tenderness, TM clear, erythema and edema of nasal mucosa, right anterior cervical adenitis Chest: Clear to auscultation bilaterally.No crackles or wheezes  CVS: S1, S2 no murmurs, no S3.Regular rate.  ABD: Soft non tender.   Ext: No edema  MS: Adequate ROM spine, shoulders, hips and knees.  Skin: Intact, no ulcerations or rash noted.  Psych: Good eye contact, normal affect. Memory intact not anxious or depressed appearing.  CNS: CN 2-12 intact, power,  normal throughout.no focal deficits noted.      Assessment & Plan:  Seasonal allergies Uncontrolled, depo medrol in office , short course of steroids prescribed also phenergan DM syrup  Maxillary sinusitis, acute z pack x 1  Obesity, Class II, BMI 35-39.9, with comorbidity Deteriorated. Patient re-educated about  the importance of  commitment to a  minimum of 150 minutes of exercise per week.  The importance of healthy food choices with portion control discussed. Encouraged to start a food diary, count calories and to consider  joining a support group. Sample diet sheets offered. Goals set by the patient for the next several months.   Weight /BMI 06/25/2015 04/12/2015 12/11/2014  WEIGHT 180 lb 6.4 oz 176 lb 1.9 oz 180 lb  HEIGHT 4\' 11"  4\' 11"  4\' 11"   BMI 36.42 kg/m2 35.55 kg/m2 36.34 kg/m2    Current exercise per week 90 minutes.

## 2015-06-25 NOTE — Assessment & Plan Note (Signed)
z pack x 1

## 2015-06-29 ENCOUNTER — Other Ambulatory Visit: Payer: Self-pay

## 2015-06-29 MED ORDER — VERAPAMIL HCL ER 120 MG PO TBCR
120.0000 mg | EXTENDED_RELEASE_TABLET | Freq: Every day | ORAL | Status: DC
Start: 2015-06-29 — End: 2015-12-26

## 2015-07-11 ENCOUNTER — Other Ambulatory Visit: Payer: Self-pay

## 2015-07-11 MED ORDER — LOVASTATIN 40 MG PO TABS: 40.0000 mg | ORAL_TABLET | Freq: Every day | ORAL | Status: DC

## 2015-07-11 MED ORDER — PANTOPRAZOLE SODIUM 40 MG PO TBEC: 40.0000 mg | DELAYED_RELEASE_TABLET | Freq: Every day | ORAL | Status: DC

## 2015-09-11 ENCOUNTER — Encounter: Payer: Medicare Other | Admitting: Family Medicine

## 2015-09-21 ENCOUNTER — Telehealth: Payer: Self-pay | Admitting: Family Medicine

## 2015-09-21 MED ORDER — LORATADINE 10 MG PO TABS: 10.0000 mg | ORAL_TABLET | Freq: Every day | ORAL | Status: DC

## 2015-09-21 NOTE — Telephone Encounter (Signed)
Patient states that she has uncontrolled allergies, sneezing, nose running clear, she is asking for an allergy medication , please advise?

## 2015-10-09 ENCOUNTER — Ambulatory Visit (INDEPENDENT_AMBULATORY_CARE_PROVIDER_SITE_OTHER): Payer: Medicare Other | Admitting: Family Medicine

## 2015-10-09 ENCOUNTER — Encounter: Payer: Self-pay | Admitting: Family Medicine

## 2015-10-09 ENCOUNTER — Other Ambulatory Visit: Payer: Self-pay | Admitting: Family Medicine

## 2015-10-09 VITALS — BP 130/84 | HR 71 | Temp 98.6°F | Resp 16 | Ht 59.0 in | Wt 182.1 lb

## 2015-10-09 DIAGNOSIS — Z1211 Encounter for screening for malignant neoplasm of colon: Secondary | ICD-10-CM

## 2015-10-09 DIAGNOSIS — J302 Other seasonal allergic rhinitis: Secondary | ICD-10-CM | POA: Diagnosis not present

## 2015-10-09 DIAGNOSIS — Z1159 Encounter for screening for other viral diseases: Secondary | ICD-10-CM

## 2015-10-09 DIAGNOSIS — Z0000 Encounter for general adult medical examination without abnormal findings: Secondary | ICD-10-CM | POA: Diagnosis not present

## 2015-10-09 DIAGNOSIS — R7303 Prediabetes: Secondary | ICD-10-CM

## 2015-10-09 DIAGNOSIS — E8881 Metabolic syndrome: Secondary | ICD-10-CM

## 2015-10-09 DIAGNOSIS — E785 Hyperlipidemia, unspecified: Secondary | ICD-10-CM | POA: Diagnosis not present

## 2015-10-09 DIAGNOSIS — J0101 Acute recurrent maxillary sinusitis: Secondary | ICD-10-CM

## 2015-10-09 LAB — HEMOCCULT GUIAC POC 1CARD (OFFICE): Fecal Occult Blood, POC: NEGATIVE

## 2015-10-09 MED ORDER — AZELASTINE HCL 0.1 % NA SOLN: 2.0000 | Freq: Two times a day (BID) | NASAL | Status: DC

## 2015-10-09 MED ORDER — BENZONATATE 100 MG PO CAPS: 100.0000 mg | ORAL_CAPSULE | Freq: Two times a day (BID) | ORAL | Status: DC | PRN

## 2015-10-09 MED ORDER — AZITHROMYCIN 250 MG PO TABS: ORAL_TABLET | ORAL | Status: DC

## 2015-10-09 MED ORDER — PROMETHAZINE-DM 6.25-15 MG/5ML PO SYRP: ORAL_SOLUTION | ORAL | Status: DC

## 2015-10-09 NOTE — Assessment & Plan Note (Signed)

## 2015-10-09 NOTE — Patient Instructions (Addendum)
F/u in 5 months.for annual wellness  Come in next 2 to 3 weeks for flu vaccine  Medications sent for uncontrolled allergies, cough and sinus infection   Labs today, add CBC to previousle ordered  Thanks for choosing [facility], we consider it a privelige to serve you.   All the best for 2017!  Congrats on first publication!  I WILL read this

## 2015-10-09 NOTE — Progress Notes (Signed)
   Subjective:    Patient ID: Tara [name], female    DOB: 1946-02-25, 70 y.o.   MRN: [id number]  HPI Patient is in for annual physical exam. 2 week h/o sinus pressure over cheeks, increased nasal drainage and cough, worse at night . Intermittent chills , and sputum thickening and discolored  Review of Systems See HPI     Objective:   Physical Exam BP 130/84 mmHg  Pulse 71  Temp(Src) 98.6 F (37 C) (Oral)  Resp 16  Ht 4\' 11"  (1.499 m)  Wt 182 lb 1.9 oz (82.609 kg)  BMI 36.76 kg/m2  SpO2 96%  Pleasant well nourished female, alert and oriented x 3, in no cardio-pulmonary distress. Afebrile. HEENT No facial trauma or asymetry. Maxillary Sinuses  tender.  Extra occullar muscles intact, pupils equally reactive to light. External ears normal, tympanic membranes clear. Oropharynx moist, no exudate,fair dentition. Neck: supple, no adenopathy,JVD or thyromegaly.No bruits.  Chest: Adequate air entry , scattered crackles in bases , no wheexze Non tender to palpation  Breast:  Asymetry, due to lumpectomy,no masses or lumps. No tenderness. No nipple discharge or inversion. No axillary or supraclavicular adenopathy  Cardiovascular system; Heart sounds normal,  S1 and  S2 ,no S3.  No murmur, or thrill. Apical beat not displaced Peripheral pulses normal.  Abdomen: Soft, non tender, no organomegaly or masses. No bruits. Bowel sounds normal. No guarding, tenderness or rebound.  Rectal:  Normal sphincter tone. No mass.No rectal masses.  Guaiac negative stool.  GU: External genitalia normal female genitalia , female distribution of hair. No lesions. Urethral meatus normal in size, no  Prolapse, no lesions visibly  Present. Bladder non tender. Vagina pink and moist , with no visible lesions , discharge present . Adequate pelvic support no  cystocele or rectocele noted Uterus absent, no adnexal masses, no adnexal tendernes  Musculoskeletal exam: Full ROM of spine, hips ,  shoulders and knees. No deformity ,swelling or crepitus noted. No muscle wasting or atrophy.   Neurologic: Cranial nerves 2 to 12 intact. Power, tone ,sensation and reflexes normal throughout. No disturbance in gait. No tremor.  Skin: Intact, no ulceration, erythema , scaling or rash noted. Pigmentation normal throughout  Psych; Normal mood and affect. Judgement and concentration normal       Assessment & Plan:  Annual physical exam Annual exam as documented. Counseling done  re healthy lifestyle involving commitment to 150 minutes exercise per week, heart healthy diet, and attaining healthy weight.The importance of adequate sleep also discussed. Regular seat belt use and home safety, is also discussed. Changes in health habits are decided on by the patient with goals and time frames  set for achieving them. Immunization and cancer screening needs are specifically addressed at this visit.   Maxillary sinusitis, acute 2 week h/o worsening symptoms, consistent with  infection, antibiotic prescribed   Seasonal allergies Uncontrolled, needs to commit to daily medication and saline nasal flushes. Symptomatic medication prescribed

## 2015-10-10 LAB — LIPID PANEL
CHOL/HDL RATIO: 3.5 ratio (ref <=5.0)
CHOLESTEROL: 205 mg/dL — AB (ref 125–200)
HDL: 59 mg/dL (ref >=46)
LDL Cholesterol: 119 mg/dL (ref <130)
TRIGLYCERIDES: 135 mg/dL (ref <150)
VLDL: 27 mg/dL (ref <30)

## 2015-10-10 LAB — CBC
HEMATOCRIT: 38.1 % (ref 36.0–46.0)
HEMOGLOBIN: 11.9 g/dL — AB (ref 12.0–15.0)
MCH: 26.5 pg (ref 26.0–34.0)
MCHC: 31.2 g/dL (ref 30.0–36.0)
MCV: 84.9 fL (ref 78.0–100.0)
MPV: 10.3 fL (ref 8.6–12.4)
Platelets: 285 10*3/uL (ref 150–400)
RBC: 4.49 MIL/uL (ref 3.87–5.11)
RDW: 14.8 % (ref 11.5–15.5)
WBC: 11.2 10*3/uL — ABNORMAL HIGH (ref 4.0–10.5)

## 2015-10-10 LAB — HEPATIC FUNCTION PANEL
ALBUMIN: 3.9 g/dL (ref 3.6–5.1)
ALT: 12 U/L (ref 6–29)
AST: 16 U/L (ref 10–35)
Alkaline Phosphatase: 71 U/L (ref 33–130)
Bilirubin, Direct: 0.1 mg/dL (ref <=0.2)
Indirect Bilirubin: 0.3 mg/dL (ref 0.2–1.2)
Total Bilirubin: 0.4 mg/dL (ref 0.2–1.2)
Total Protein: 6.9 g/dL (ref 6.1–8.1)

## 2015-10-10 LAB — HEMOGLOBIN A1C
Hgb A1c MFr Bld: 5.8 % — ABNORMAL HIGH (ref <5.7)
Mean Plasma Glucose: 120 mg/dL — ABNORMAL HIGH (ref <117)

## 2015-10-10 LAB — HEPATITIS C ANTIBODY: HCV Ab: NEGATIVE

## 2015-10-10 LAB — TSH: TSH: 3.956 u[IU]/mL (ref 0.350–4.500)

## 2015-10-19 ENCOUNTER — Encounter: Payer: Self-pay | Admitting: Family Medicine

## 2015-10-19 NOTE — Assessment & Plan Note (Signed)
Uncontrolled, needs to commit to daily medication and saline nasal flushes. Symptomatic medication prescribed

## 2015-10-19 NOTE — Assessment & Plan Note (Addendum)
2 week h/o worsening symptoms, consistent with  infection, antibiotic prescribed

## 2015-10-22 ENCOUNTER — Telehealth: Payer: Self-pay | Admitting: Family Medicine

## 2015-10-22 NOTE — Telephone Encounter (Signed)
Patient is asking for a refill on metoCLOPramide (REGLAN) 10 MG tablet and clonazePAM (KLONOPIN) 0.5 MG tablet, she is also asking for something for cough and wheezing

## 2015-10-23 MED ORDER — CLONAZEPAM 0.5 MG PO TABS: 0.5000 mg | ORAL_TABLET | Freq: Every day | ORAL | Status: DC

## 2015-10-23 MED ORDER — METOCLOPRAMIDE HCL 10 MG PO TABS: 10.0000 mg | ORAL_TABLET | Freq: Two times a day (BID) | ORAL | Status: DC

## 2015-10-24 MED ORDER — PREDNISONE 5 MG PO TABS: 5.0000 mg | ORAL_TABLET | Freq: Two times a day (BID) | ORAL | Status: DC

## 2015-10-24 NOTE — Telephone Encounter (Signed)
Patient aware and med sent  

## 2015-10-24 NOTE — Addendum Note (Signed)
Addended by: Eual Fines on: 10/24/2015 01:28 PM   Modules accepted: Orders

## 2015-10-24 NOTE — Telephone Encounter (Signed)
Patient still having a lot of coughing after finishing her zpak and tessalon from 2 weeks ago. Phlegm is clear but she is still coughing up mucus. Wants to know what else she can do for it. Still can hear it rattling in her chest.  No other symptoms

## 2015-10-24 NOTE — Telephone Encounter (Signed)
Allergy based symptom, ensure she is using allergy spar (nose) astellin eVERY day, also pls send pred 5 mg one twice daily for  5 days onnly, this will help to get better control

## 2015-11-06 NOTE — Addendum Note (Signed)
Addended by: Denman George B on: 11/06/2015 09:14 AM   Modules accepted: Orders

## 2015-11-12 ENCOUNTER — Other Ambulatory Visit: Payer: Self-pay

## 2015-11-12 MED ORDER — SERTRALINE HCL 50 MG PO TABS: 50.0000 mg | ORAL_TABLET | Freq: Every day | ORAL | Status: DC

## 2015-11-12 MED ORDER — ALENDRONATE SODIUM 70 MG PO TABS: 70.0000 mg | ORAL_TABLET | ORAL | Status: DC

## 2015-11-26 ENCOUNTER — Other Ambulatory Visit: Payer: Self-pay

## 2015-11-26 ENCOUNTER — Telehealth: Payer: Self-pay | Admitting: Family Medicine

## 2015-11-26 MED ORDER — MONTELUKAST SODIUM 10 MG PO TABS: 10.0000 mg | ORAL_TABLET | Freq: Every day | ORAL | Status: DC

## 2015-11-26 MED ORDER — FLUTICASONE PROPIONATE 50 MCG/ACT NA SUSP: 2.0000 | Freq: Every day | NASAL | Status: DC

## 2015-11-26 NOTE — Telephone Encounter (Signed)
C/io wheeze and uncontrolled allergy, will d/c claritin , send singulair and nasal spray, sent to mail order, may need to send local pharmacy sent to mail order by mistake

## 2015-11-26 NOTE — Telephone Encounter (Signed)
Cancelled through mail order and sent it to [facility]

## 2015-12-10 ENCOUNTER — Ambulatory Visit: Payer: Medicare Other | Admitting: Internal Medicine

## 2015-12-26 ENCOUNTER — Other Ambulatory Visit: Payer: Self-pay | Admitting: *Deleted

## 2015-12-26 MED ORDER — VERAPAMIL HCL ER 120 MG PO TBCR
120.0000 mg | EXTENDED_RELEASE_TABLET | Freq: Every day | ORAL | Status: DC
Start: 2015-12-26 — End: 2016-03-26

## 2015-12-31 ENCOUNTER — Other Ambulatory Visit: Payer: Self-pay

## 2015-12-31 DIAGNOSIS — Z1231 Encounter for screening mammogram for malignant neoplasm of breast: Secondary | ICD-10-CM

## 2016-02-07 ENCOUNTER — Ambulatory Visit
Admission: RE | Admit: 2016-02-07 | Discharge: 2016-02-07 | Disposition: A | Discharge status: Home / Self Care | Payer: Medicare Other | Source: Ambulatory Visit

## 2016-02-07 DIAGNOSIS — Z1231 Encounter for screening mammogram for malignant neoplasm of breast: Secondary | ICD-10-CM | POA: Diagnosis not present

## 2016-02-12 ENCOUNTER — Other Ambulatory Visit: Payer: Self-pay

## 2016-02-12 MED ORDER — LOVASTATIN 40 MG PO TABS: 40.0000 mg | ORAL_TABLET | Freq: Every day | ORAL | Status: DC

## 2016-02-12 MED ORDER — PANTOPRAZOLE SODIUM 40 MG PO TBEC: 40.0000 mg | DELAYED_RELEASE_TABLET | Freq: Every day | ORAL | Status: DC

## 2016-02-17 ENCOUNTER — Telehealth: Payer: Self-pay | Admitting: Family Medicine

## 2016-02-17 MED ORDER — PREDNISONE 5 MG PO TABS: 5.0000 mg | ORAL_TABLET | Freq: Two times a day (BID) | ORAL | Status: AC

## 2016-02-17 MED ORDER — PROMETHAZINE-DM 6.25-15 MG/5ML PO SYRP: ORAL_SOLUTION | ORAL | Status: DC

## 2016-02-17 NOTE — Telephone Encounter (Signed)
Sinus congestion, cough and clear phlegm x 1 day, using nose spray and singulair, predniosne and brom\fed dm sent to [facility], she is to call in next 2 to 3 days if worse for oV

## 2016-02-22 ENCOUNTER — Other Ambulatory Visit: Payer: Self-pay | Admitting: Family Medicine

## 2016-02-25 ENCOUNTER — Ambulatory Visit (INDEPENDENT_AMBULATORY_CARE_PROVIDER_SITE_OTHER): Payer: Medicare Other | Admitting: Internal Medicine

## 2016-02-25 ENCOUNTER — Encounter: Payer: Self-pay | Admitting: Internal Medicine

## 2016-02-25 VITALS — BP 132/80 | HR 81 | Ht 59.0 in | Wt 188.2 lb

## 2016-02-25 DIAGNOSIS — I471 Supraventricular tachycardia: Secondary | ICD-10-CM

## 2016-02-25 DIAGNOSIS — I429 Cardiomyopathy, unspecified: Secondary | ICD-10-CM | POA: Diagnosis not present

## 2016-02-25 DIAGNOSIS — I428 Other cardiomyopathies: Secondary | ICD-10-CM

## 2016-02-25 NOTE — Patient Instructions (Signed)
Medication Instructions:  Your physician recommends that you continue on your current medications as directed. Please refer to the Current Medication list given to you today.   Labwork: None ordered   Testing/Procedures: Your physician has requested that you have an echocardiogram. Echocardiography is a painless test that uses sound waves to create images of your heart. It provides your doctor with information about the size and shape of your heart and how well your heart's chambers and valves are working. This procedure takes approximately one hour. There are no restrictions for this procedure.    Follow-Up: Your physician wants you to follow-up in: 12 months with Dr Vallery Ridge will receive a reminder letter in the mail two months in advance. If you don't receive a letter, please call our office to schedule the follow-up appointment.   Any Other Special Instructions Will Be Listed Below (If Applicable).     If you need a refill on your cardiac medications before your next appointment, please call your pharmacy.

## 2016-02-25 NOTE — Progress Notes (Signed)
PCP: [name], MD Primary EP:    Dr Rayann Heman  Tara [name] is a 70 y.o. female who presents today for routine electrophysiology followup.  Since last being seen in our clinic, the patient reports doing very well.  Denies SVT.  Today, she denies symptoms of palpitations, chest pain, shortness of breath,  lower extremity edema, dizziness, presyncope, or syncope.  The patient is otherwise without complaint today.   Past Medical History  Diagnosis Date  . H/O: hysterectomy   . History of cancer chemotherapy   . History of radiation therapy   . Depression   . GERD (gastroesophageal reflux disease)   . SVT (supraventricular tachycardia) ([place])     short RP SVT documented 5/14  . Hiatal hernia   . Hyperlipidemia   . Nonischemic cardiomyopathy ([place])   . Breast cancer [facility]) 2008    right - s/p lumpectomy->chemo, radiation  . Dysrhythmia     hx SVT  . Anxiety   . Hypertension    Past Surgical History  Procedure Laterality Date  . Total abdominal hysterectomy w/ bilateral salpingoophorectomy  1994  . Cholecystectomy  1999  . Abdominal hysterectomy  1994    fibroids,   . Colonoscopy  2008    Dr. Oneida Alar: multiple polyps. Path not available at time of visit.   . Colonoscopy with propofol N/A 04/25/2014    Procedure: COLONOSCOPY WITH PROPOFOL;  Surgeon: Danie Binder, MD;  Location: [facility];  Service: Endoscopy;  Laterality: N/A;  Cecum time in 1132     time out 1203 total time 31 minutes  . Polypectomy N/A 04/25/2014    Procedure: POLYPECTOMY;  Surgeon: Danie Binder, MD;  Location: [facility];  Service: Endoscopy;  Laterality: N/A;  Ascending and Decending Colon x3 , Transverse colon x2, rectal  . Breast surgery Right 2008    lumpectomy, cancer    ROS- all systems are reviewed and negatives except as per HPI above  Current Outpatient Prescriptions  Medication Sig Dispense Refill  . alendronate (FOSAMAX) 70 MG tablet Take 1 tablet (70 mg total) by mouth every 7 (seven) days. Take with  a full glass of water on an empty stomach. 12 tablet 1  . azelastine (ASTELIN) 0.1 % nasal spray Place 2 sprays into both nostrils 2 (two) times daily. Use in each nostril as directed 30 mL 12  . cholecalciferol (VITAMIN D) 1000 UNITS tablet Take 1,000 Units by mouth daily.    . clonazePAM (KLONOPIN) 0.5 MG tablet Take 1 tablet (0.5 mg total) by mouth at bedtime. 90 tablet 1  . clotrimazole-betamethasone (LOTRISONE) cream Apply 1 application topically 2 (two) times daily. 45 g 0  . docusate sodium (COLACE) 100 MG capsule Take 100 mg by mouth 2 (two) times daily.    . fexofenadine (ALLEGRA) 180 MG tablet Take 180 mg by mouth as directed.     . fluticasone (FLONASE) 50 MCG/ACT nasal spray Place 2 sprays into both nostrils daily. 16 g 2  . lovastatin (MEVACOR) 40 MG tablet Take 1 tablet (40 mg total) by mouth at bedtime. 90 tablet 1  . metoCLOPramide (REGLAN) 10 MG tablet TAKE ONE CAPSULE BY MOUTH TWICE DAILY. 180 tablet 1  . montelukast (SINGULAIR) 10 MG tablet TAKE (1) TABLET BY MOUTH AT BEDTIME. 30 tablet 1  . pantoprazole (PROTONIX) 40 MG tablet Take 1 tablet (40 mg total) by mouth daily. 90 tablet 1  . promethazine-dextromethorphan (PROMETHAZINE-DM) 6.25-15 MG/5ML syrup One teaspoon at bedtime, as needed, for excessive cough  118 mL 0  . sertraline (ZOLOFT) 50 MG tablet Take 1 tablet (50 mg total) by mouth daily. 90 tablet 1  . verapamil (CALAN-SR) 120 MG CR tablet Take 1 tablet (120 mg total) by mouth daily. 90 tablet 0  . zolpidem (AMBIEN) 10 MG tablet Take 1 tablet (10 mg total) by mouth at bedtime as needed for sleep. 90 tablet 1   No current facility-administered medications for this visit.    Physical Exam: Filed Vitals:   02/25/16 1511  BP: 132/80  Pulse: 81  Height: 4\' 11"  (1.499 m)  Weight: 188 lb 3.2 oz (85.367 kg)    GEN- The patient is well appearing, alert and oriented x 3 today.   Head- normocephalic, atraumatic Eyes-  Sclera clear, conjunctiva pink Ears- hearing  intact Oropharynx- clear Lungs- Clear to ausculation bilaterally, normal work of breathing Heart- Regular rate and rhythm, no murmurs, rubs or gallops, PMI not laterally displaced GI- soft, NT, ND, + BS Extremities- no clubbing, cyanosis, or edema  ekg today reveals sinus rhythm 81 bpm, PVCs, LVH, nonspecific ST/T changes (chronic lateral ST depression)  Assessment and Plan:  1. SVT Well controlled with verapamil No changes  2. htn Stable No change required today  3.  NICM (nonischemic cardiomyopathy) - EF 45% in 2014.  Echo reviewed with patient today Will repeat echo at this time Reports cough with ace inhibitor previously  Return to see me in 1 year unless problems arise in the interim.  Thompson Grayer MD, [facility] 02/25/2016 3:19 PM

## 2016-03-03 ENCOUNTER — Other Ambulatory Visit: Payer: Self-pay

## 2016-03-03 MED ORDER — ZOLPIDEM TARTRATE 10 MG PO TABS: 10.0000 mg | ORAL_TABLET | Freq: Every evening | ORAL | Status: DC | PRN

## 2016-03-05 ENCOUNTER — Other Ambulatory Visit: Payer: Self-pay

## 2016-03-08 ENCOUNTER — Emergency Department (HOSPITAL_COMMUNITY)
Admission: EM | Admit: 2016-03-08 | Discharge: 2016-03-08 | Disposition: A | Discharge status: Home / Self Care | Payer: Medicare Other | Attending: Emergency Medicine | Admitting: Emergency Medicine

## 2016-03-08 ENCOUNTER — Encounter (HOSPITAL_COMMUNITY): Payer: Self-pay | Admitting: *Deleted

## 2016-03-08 ENCOUNTER — Emergency Department (HOSPITAL_COMMUNITY): Payer: Medicare Other

## 2016-03-08 DIAGNOSIS — S5000XA Contusion of unspecified elbow, initial encounter: Secondary | ICD-10-CM | POA: Diagnosis not present

## 2016-03-08 DIAGNOSIS — Z853 Personal history of malignant neoplasm of breast: Secondary | ICD-10-CM | POA: Diagnosis not present

## 2016-03-08 DIAGNOSIS — S59902A Unspecified injury of left elbow, initial encounter: Secondary | ICD-10-CM | POA: Diagnosis not present

## 2016-03-08 DIAGNOSIS — S8992XA Unspecified injury of left lower leg, initial encounter: Secondary | ICD-10-CM | POA: Diagnosis not present

## 2016-03-08 DIAGNOSIS — Y999 Unspecified external cause status: Secondary | ICD-10-CM | POA: Insufficient documentation

## 2016-03-08 DIAGNOSIS — S8000XA Contusion of unspecified knee, initial encounter: Secondary | ICD-10-CM | POA: Diagnosis not present

## 2016-03-08 DIAGNOSIS — Y939 Activity, unspecified: Secondary | ICD-10-CM | POA: Insufficient documentation

## 2016-03-08 DIAGNOSIS — E785 Hyperlipidemia, unspecified: Secondary | ICD-10-CM | POA: Diagnosis not present

## 2016-03-08 DIAGNOSIS — Y929 Unspecified place or not applicable: Secondary | ICD-10-CM | POA: Diagnosis not present

## 2016-03-08 DIAGNOSIS — M25562 Pain in left knee: Secondary | ICD-10-CM | POA: Diagnosis not present

## 2016-03-08 DIAGNOSIS — W109XXA Fall (on) (from) unspecified stairs and steps, initial encounter: Secondary | ICD-10-CM | POA: Diagnosis not present

## 2016-03-08 DIAGNOSIS — M25579 Pain in unspecified ankle and joints of unspecified foot: Secondary | ICD-10-CM

## 2016-03-08 DIAGNOSIS — S59909A Unspecified injury of unspecified elbow, initial encounter: Secondary | ICD-10-CM | POA: Diagnosis present

## 2016-03-08 DIAGNOSIS — S59901A Unspecified injury of right elbow, initial encounter: Secondary | ICD-10-CM | POA: Diagnosis not present

## 2016-03-08 DIAGNOSIS — M25521 Pain in right elbow: Secondary | ICD-10-CM | POA: Diagnosis not present

## 2016-03-08 DIAGNOSIS — F329 Major depressive disorder, single episode, unspecified: Secondary | ICD-10-CM | POA: Insufficient documentation

## 2016-03-08 DIAGNOSIS — M25569 Pain in unspecified knee: Secondary | ICD-10-CM

## 2016-03-08 DIAGNOSIS — T148 Other injury of unspecified body region: Secondary | ICD-10-CM | POA: Diagnosis not present

## 2016-03-08 DIAGNOSIS — T148XXA Other injury of unspecified body region, initial encounter: Secondary | ICD-10-CM

## 2016-03-08 DIAGNOSIS — S9000XA Contusion of unspecified ankle, initial encounter: Secondary | ICD-10-CM | POA: Diagnosis not present

## 2016-03-08 DIAGNOSIS — M25529 Pain in unspecified elbow: Secondary | ICD-10-CM

## 2016-03-08 DIAGNOSIS — M7989 Other specified soft tissue disorders: Secondary | ICD-10-CM | POA: Diagnosis not present

## 2016-03-08 DIAGNOSIS — S99911A Unspecified injury of right ankle, initial encounter: Secondary | ICD-10-CM | POA: Diagnosis not present

## 2016-03-08 DIAGNOSIS — W19XXXA Unspecified fall, initial encounter: Secondary | ICD-10-CM

## 2016-03-08 DIAGNOSIS — M25571 Pain in right ankle and joints of right foot: Secondary | ICD-10-CM | POA: Diagnosis not present

## 2016-03-08 DIAGNOSIS — M25522 Pain in left elbow: Secondary | ICD-10-CM | POA: Diagnosis not present

## 2016-03-08 DIAGNOSIS — M25561 Pain in right knee: Secondary | ICD-10-CM | POA: Diagnosis not present

## 2016-03-08 DIAGNOSIS — M25572 Pain in left ankle and joints of left foot: Secondary | ICD-10-CM | POA: Diagnosis not present

## 2016-03-08 MED ORDER — HYDROCODONE-ACETAMINOPHEN 5-325 MG PO TABS
1.0000 | ORAL_TABLET | Freq: Once | ORAL | Status: AC
  Administered 2016-03-08: 1 via ORAL
  Filled 2016-03-08: qty 1

## 2016-03-08 MED ORDER — HYDROCODONE-ACETAMINOPHEN 5-325 MG PO TABS: ORAL_TABLET | ORAL | Status: DC

## 2016-03-08 NOTE — ED Notes (Signed)
Dr. Thurnell Garbe at bedside updating pt and family.

## 2016-03-08 NOTE — ED Notes (Signed)
Pt states she was away in [facility] and fell down some stairs. Pt c/o bilateral hand, arm, and knee pain.

## 2016-03-08 NOTE — ED Provider Notes (Signed)
CSN: [id number]     Arrival date & time 03/08/16  0343 History   First MD Initiated Contact with Patient 03/08/16 0353     Chief Complaint  Patient presents with  . Fall      HPI Pt was seen at [place]. Per pt, c/o sudden onset and resolution of one episode of slip and fall down some stairs 2 days ago. Pt states she fell on her knees, then hands and elbows. Pt c/o bilat knees, ankles, elbows pain with scattered bruising. Pt was not evaluated medically at the time of the fall. Denies hitting head, no LOC, no AMS, no neck or back pain, no CP/SOB, no abd pain, no N/V/D, no focal motor weakness, no tingling/numbness in extremities.     Past Medical History  Diagnosis Date  . H/O: hysterectomy   . History of cancer chemotherapy   . History of radiation therapy   . Depression   . GERD (gastroesophageal reflux disease)   . SVT (supraventricular tachycardia) ([place])     short RP SVT documented 5/14  . Hiatal hernia   . Hyperlipidemia   . Nonischemic cardiomyopathy ([place])   . Breast cancer [facility]) 2008    right - s/p lumpectomy->chemo, radiation  . Dysrhythmia     hx SVT  . Anxiety    Past Surgical History  Procedure Laterality Date  . Total abdominal hysterectomy w/ bilateral salpingoophorectomy  1994  . Cholecystectomy  1999  . Abdominal hysterectomy  1994    fibroids,   . Colonoscopy  2008    Dr. Oneida Alar: multiple polyps. Path not available at time of visit.   . Colonoscopy with propofol N/A 04/25/2014    Procedure: COLONOSCOPY WITH PROPOFOL;  Surgeon: Danie Binder, MD;  Location: [facility];  Service: Endoscopy;  Laterality: N/A;  Cecum time in 1132     time out 1203 total time 31 minutes  . Polypectomy N/A 04/25/2014    Procedure: POLYPECTOMY;  Surgeon: Danie Binder, MD;  Location: [facility];  Service: Endoscopy;  Laterality: N/A;  Ascending and Decending Colon x3 , Transverse colon x2, rectal  . Breast surgery Right 2008    lumpectomy, cancer   Family History  Problem Relation Age of  Onset  . Colon cancer Father 61    died at age 60  . Cancer Father     colon   . Hypertension Sister   . Cancer Brother 104    prostate   Social History  Substance Use Topics  . Smoking status: Never Smoker   . Smokeless tobacco: None  . Alcohol Use: No   OB History    No data available     Review of Systems ROS: Statement: All systems negative except as marked or noted in the HPI; Constitutional: Negative for fever and chills. ; ; Eyes: Negative for eye pain, redness and discharge. ; ; ENMT: Negative for ear pain, hoarseness, nasal congestion, sinus pressure and sore throat. ; ; Cardiovascular: Negative for chest pain, palpitations, diaphoresis, dyspnea and peripheral edema. ; ; Respiratory: Negative for cough, wheezing and stridor. ; ; Gastrointestinal: Negative for nausea, vomiting, diarrhea, abdominal pain, blood in stool, hematemesis, jaundice and rectal bleeding. . ; ; Genitourinary: Negative for dysuria, flank pain and hematuria. ; ; Musculoskeletal: +elbows pain, knees pain, ankles pain. Negative for back pain and neck pain. Negative for swelling and deformity.; ; Skin: +bruising. Negative for pruritus, rash, abrasions, blisters, and skin lesion.; ; Neuro: Negative for headache, lightheadedness and neck stiffness.  Negative for weakness, altered level of consciousness, altered mental status, extremity weakness, paresthesias, involuntary movement, seizure and syncope.      Allergies  Aspirin  Home Medications   Prior to Admission medications   Medication Sig Start Date End Date Taking? Authorizing Provider  alendronate (FOSAMAX) 70 MG tablet Take 1 tablet (70 mg total) by mouth every 7 (seven) days. Take with a full glass of water on an empty stomach. 11/12/15   [name], MD  azelastine (ASTELIN) 0.1 % nasal spray Place 2 sprays into both nostrils 2 (two) times daily. Use in each nostril as directed 10/09/15   [name], MD  cholecalciferol (VITAMIN D) 1000  UNITS tablet Take 1,000 Units by mouth daily.    Historical Provider, MD  clonazePAM (KLONOPIN) 0.5 MG tablet Take 1 tablet (0.5 mg total) by mouth at bedtime. 10/23/15   [name], MD  clotrimazole-betamethasone (LOTRISONE) cream Apply 1 application topically 2 (two) times daily. 04/12/15   [name], MD  docusate sodium (COLACE) 100 MG capsule Take 100 mg by mouth 2 (two) times daily.    Historical Provider, MD  fexofenadine (ALLEGRA) 180 MG tablet Take 180 mg by mouth as directed.     Historical Provider, MD  fluticasone (FLONASE) 50 MCG/ACT nasal spray Place 2 sprays into both nostrils daily. 11/26/15   [name], MD  lovastatin (MEVACOR) 40 MG tablet Take 1 tablet (40 mg total) by mouth at bedtime. 02/12/16   [name], MD  metoCLOPramide (REGLAN) 10 MG tablet TAKE ONE CAPSULE BY MOUTH TWICE DAILY. 02/22/16   [name], MD  montelukast (SINGULAIR) 10 MG tablet TAKE (1) TABLET BY MOUTH AT BEDTIME. 02/22/16   [name], MD  pantoprazole (PROTONIX) 40 MG tablet Take 1 tablet (40 mg total) by mouth daily. 02/12/16   [name], MD  promethazine-dextromethorphan (PROMETHAZINE-DM) 6.25-15 MG/5ML syrup One teaspoon at bedtime, as needed, for excessive cough 02/17/16   [name], MD  sertraline (ZOLOFT) 50 MG tablet Take 1 tablet (50 mg total) by mouth daily. 11/12/15   [name], MD  verapamil (CALAN-SR) 120 MG CR tablet Take 1 tablet (120 mg total) by mouth daily. 12/26/15   Thompson Grayer, MD  zolpidem (AMBIEN) 10 MG tablet Take 1 tablet (10 mg total) by mouth at bedtime as needed for sleep. 03/03/16   [name], MD   BP 161/87 mmHg  Pulse 78  Temp(Src) 97.8 F (36.6 C) (Oral)  Resp 18  Ht 4\' 11"  (1.499 m)  Wt 188 lb (85.276 kg)  BMI 37.95 kg/m2  SpO2 97% Physical Exam  0410: Physical examination: Vital signs and O2 SAT: Reviewed; Constitutional: Well developed, Well nourished, Well hydrated, In no acute distress; Head  and Face: Normocephalic, Atraumatic; Eyes: EOMI, PERRL, No scleral icterus; ENMT: Mouth and pharynx normal, Mucous membranes moist; Neck: Supple, Trachea midline; Spine: No midline CS, TS, LS tenderness.; Cardiovascular: Regular rate and rhythm, No gallop; Respiratory: Breath sounds clear & equal bilaterally, No wheezes, Normal respiratory effort/excursion; Chest: Nontender, No deformity, Movement normal, No crepitus, No abrasions or ecchymosis.; Abdomen: Soft, Nontender, Nondistended, Normal bowel sounds, No abrasions or ecchymosis.; Genitourinary: No CVA tenderness;; Extremities: No deformity, Full range of motion major/large joints of bilat UE's and LE's without pain or tenderness to palp, Neurovascularly intact, Pulses normal, No tenderness, +1 pedal edema bilat. Pelvis stable.  NT bilat shoulders/elbows/wrists/hands; fading ecchymosis right dorsal proximal ulnar area, no erythema, no open wounds.  NT bilat hips/knees/ankles/feet; fading ecchymosis bilat knees, no erythema, no open wounds. +FROM bilateral knees, including able to lift extended bilateral LE's off stretcher, and extend bilateral lower legs against resistance.  No ligamentous laxity.  No patellar or quad tendon step-offs.  NMS intact bilateral feet, strong pedal pp. +plantarflexion of right and left foot w/calf squeeze.  No palpable gap right and left Achilles's tendon.  No bilateral proximal fibular head tenderness.  NMS intact bilat UE's and LE's. Muscles compartments soft. Strong peripheral pulses.; Neuro: AA&Ox3, GCS 15.  Major CN grossly intact. Speech clear. No gross focal motor or sensory deficits in extremities. Climbs on and off stretcher easily by herself. Gait steady.; Skin: Color normal, Warm, Dry   ED Course  Procedures (including critical care time) Labs Review  Imaging Review  I have personally reviewed and evaluated these images and lab results as part of my medical decision-making.   EKG Interpretation None      MDM   MDM Reviewed: previous chart, nursing note and vitals Interpretation: x-ray    Dg Elbow Complete Left (3+view) 03/08/2016  CLINICAL DATA:  [place] 2 days ago.  Pain. EXAM: LEFT ELBOW - COMPLETE 3+ VIEW COMPARISON:  None. FINDINGS: There is no evidence of fracture, dislocation, or joint effusion. There is no evidence of arthropathy or other focal bone abnormality. Soft tissues are unremarkable. IMPRESSION: Negative. Electronically Signed   By: Staci Righter M.D.   On: 03/08/2016 07:16   Dg Elbow Complete Right (3+view) 03/08/2016  CLINICAL DATA:  Right elbow pain after fall two days ago. Initial encounter. EXAM: RIGHT ELBOW - COMPLETE 3+ VIEW COMPARISON:  None. FINDINGS: There is no evidence of fracture, dislocation, or joint effusion. There is no evidence of arthropathy or other focal bone abnormality. Soft tissues are unremarkable. IMPRESSION: Normal right elbow. Electronically Signed   By: Marijo Conception, M.D.   On: 03/08/2016 07:19   Dg Ankle Complete Left  03/08/2016  CLINICAL DATA:  Acute left ankle pain after fall 2 days ago. Initial encounter. EXAM: LEFT ANKLE COMPLETE - 3+ VIEW COMPARISON:  None. FINDINGS: There is no evidence of fracture, dislocation, or joint effusion. There is no evidence of arthropathy or other focal bone abnormality. Soft tissue swelling is noted over lateral malleolus suggesting ligamentous injury. IMPRESSION: No fracture or dislocation is noted. Soft tissue swelling is seen over lateral malleolus suggesting ligamentous injury. Electronically Signed   By: Marijo Conception, M.D.   On: 03/08/2016 07:28   Dg Ankle Complete Right 03/08/2016  CLINICAL DATA:  [place] 2 days ago.  Pain. EXAM: RIGHT ANKLE - COMPLETE 3+ VIEW COMPARISON:  None. FINDINGS: There is no evidence of fracture, dislocation, or joint effusion. There is no evidence of arthropathy or other focal bone abnormality. Soft tissue swelling. Ankle mortise intact. IMPRESSION: Negative for fracture.  Soft tissue swelling.  Electronically Signed   By: Staci Righter M.D.   On: 03/08/2016 07:27   Dg Knee Complete 4 Views Left 03/08/2016  CLINICAL DATA:  Acute left knee pain after fall 2 days ago. Initial encounter. EXAM: LEFT KNEE - COMPLETE 4+ VIEW COMPARISON:  None. FINDINGS: No evidence of fracture, dislocation, or joint effusion. Mild narrowing of medial joint space is noted. Soft tissues are unremarkable. IMPRESSION: Mild degenerative joint disease is noted medially. No acute abnormality seen in the left knee. Electronically Signed   By: Marijo Conception, M.D.   On: 03/08/2016 07:25   Dg Knee Complete 4 Views Right 03/08/2016  CLINICAL  DATA:  Right knee pain after fall 2 days ago. EXAM: RIGHT KNEE - COMPLETE 4+ VIEW COMPARISON:  None. FINDINGS: No evidence of fracture, dislocation, or joint effusion. No evidence of arthropathy or other focal bone abnormality. Soft tissues are unremarkable. IMPRESSION: Normal right knee. Electronically Signed   By: Marijo Conception, M.D.   On: 03/08/2016 07:23    0740:  XR reassuring. Pt feels better after meds and wants to go home now. Tx symptomatically, f/u PMD and Ortho MD. Dx and testing d/w pt and family.  Questions answered.  Verb understanding, agreeable to d/c home with outpt f/u.   Francine Graven, DO 03/13/16 [phone]

## 2016-03-08 NOTE — Discharge Instructions (Signed)
Take the prescription as directed.  Apply moist heat or ice to the area(s) of discomfort, for 15 minutes at a time, several times per day for the next few days.  Do not fall asleep on a heating or ice pack. Wear the ankle braces for comfort and support for the next week. Call your regular medical doctor on Monday to schedule a follow up appointment in the next 2 days. Call the Orthopedic doctor on Monday to schedule a follow up appointment this week.  Return to the Emergency Department immediately if worsening.

## 2016-03-11 ENCOUNTER — Other Ambulatory Visit (HOSPITAL_COMMUNITY): Payer: Medicare Other

## 2016-03-13 ENCOUNTER — Telehealth: Payer: Self-pay | Admitting: Family Medicine

## 2016-03-13 NOTE — Telephone Encounter (Signed)
Tara [name] fell down some steps Friday a week ago and was in the ER at [facility], they gave her #10 vicodin for pain, but she has taken those and she states that she is still sore and bruised and swollen, she is asking if Dr. Moshe Cipro would call her in a refill for pain, please advise?

## 2016-03-14 MED ORDER — METHOCARBAMOL 500 MG PO TABS: ORAL_TABLET | ORAL | Status: DC

## 2016-03-14 NOTE — Telephone Encounter (Signed)
Tramadol has adverse drug interaction, I can prescribe muscle relaxant and recommend tylenol 500 mg 3 times daily, robaxin sent to [facility] Sorry about recent fall, and CARE not to fall again, let her kniow pl;s

## 2016-03-14 NOTE — Telephone Encounter (Signed)
[place] down steps last Friday and went to ER, no fractures but sore and bruised and she has taken all of the 10 vicodin they gave her and is still very sore and hurting in her left knee and right elbow and wants to know if you will call her in something else for pain

## 2016-03-14 NOTE — Telephone Encounter (Signed)
Patient aware.

## 2016-03-16 ENCOUNTER — Emergency Department (HOSPITAL_COMMUNITY)
Admission: EM | Admit: 2016-03-16 | Discharge: 2016-03-16 | Disposition: A | Discharge status: Home / Self Care | Payer: Medicare Other | Attending: Emergency Medicine | Admitting: Emergency Medicine

## 2016-03-16 ENCOUNTER — Other Ambulatory Visit: Payer: Self-pay

## 2016-03-16 ENCOUNTER — Encounter (HOSPITAL_COMMUNITY): Payer: Self-pay | Admitting: Emergency Medicine

## 2016-03-16 ENCOUNTER — Emergency Department (HOSPITAL_COMMUNITY): Payer: Medicare Other

## 2016-03-16 DIAGNOSIS — R0602 Shortness of breath: Secondary | ICD-10-CM | POA: Diagnosis not present

## 2016-03-16 DIAGNOSIS — J9801 Acute bronchospasm: Secondary | ICD-10-CM | POA: Diagnosis not present

## 2016-03-16 DIAGNOSIS — J209 Acute bronchitis, unspecified: Secondary | ICD-10-CM | POA: Diagnosis not present

## 2016-03-16 DIAGNOSIS — J40 Bronchitis, not specified as acute or chronic: Secondary | ICD-10-CM | POA: Diagnosis not present

## 2016-03-16 DIAGNOSIS — E785 Hyperlipidemia, unspecified: Secondary | ICD-10-CM | POA: Diagnosis not present

## 2016-03-16 DIAGNOSIS — F329 Major depressive disorder, single episode, unspecified: Secondary | ICD-10-CM | POA: Diagnosis not present

## 2016-03-16 DIAGNOSIS — R05 Cough: Secondary | ICD-10-CM | POA: Diagnosis not present

## 2016-03-16 LAB — CBC WITH DIFFERENTIAL/PLATELET
BASOS PCT: 0 %
Basophils Absolute: 0 10*3/uL (ref 0.0–0.1)
EOS PCT: 3 %
Eosinophils Absolute: 0.2 10*3/uL (ref 0.0–0.7)
HEMATOCRIT: 34.9 % — AB (ref 36.0–46.0)
Hemoglobin: 11.3 g/dL — ABNORMAL LOW (ref 12.0–15.0)
Lymphocytes Relative: 10 %
Lymphs Abs: 0.8 10*3/uL (ref 0.7–4.0)
MCH: 26.8 pg (ref 26.0–34.0)
MCHC: 32.4 g/dL (ref 30.0–36.0)
MCV: 82.9 fL (ref 78.0–100.0)
MONO ABS: 0.7 10*3/uL (ref 0.1–1.0)
MONOS PCT: 9 %
NEUTROS ABS: 5.8 10*3/uL (ref 1.7–7.7)
Neutrophils Relative %: 78 %
Platelets: 228 10*3/uL (ref 150–400)
RBC: 4.21 MIL/uL (ref 3.87–5.11)
RDW: 15 % (ref 11.5–15.5)
WBC: 7.5 10*3/uL (ref 4.0–10.5)

## 2016-03-16 LAB — COMPREHENSIVE METABOLIC PANEL
ALBUMIN: 3.5 g/dL (ref 3.5–5.0)
ALK PHOS: 70 U/L (ref 38–126)
ALT: 17 U/L (ref 14–54)
ANION GAP: 6 (ref 5–15)
AST: 24 U/L (ref 15–41)
BILIRUBIN TOTAL: 0.6 mg/dL (ref 0.3–1.2)
BUN: 13 mg/dL (ref 6–20)
CALCIUM: 8.3 mg/dL — AB (ref 8.9–10.3)
CO2: 26 mmol/L (ref 22–32)
Chloride: 108 mmol/L (ref 101–111)
Creatinine, Ser: 0.91 mg/dL (ref 0.44–1.00)
GLUCOSE: 102 mg/dL — AB (ref 65–99)
Potassium: 3.3 mmol/L — ABNORMAL LOW (ref 3.5–5.1)
Sodium: 140 mmol/L (ref 135–145)
TOTAL PROTEIN: 7 g/dL (ref 6.5–8.1)

## 2016-03-16 LAB — BRAIN NATRIURETIC PEPTIDE: B NATRIURETIC PEPTIDE 5: 227 pg/mL — AB (ref 0.0–100.0)

## 2016-03-16 LAB — I-STAT CG4 LACTIC ACID, ED
Lactic Acid, Venous: 1.23 mmol/L (ref 0.5–2.0)
Lactic Acid, Venous: 2.02 mmol/L (ref 0.5–2.0)

## 2016-03-16 LAB — I-STAT TROPONIN, ED: Troponin i, poc: 0.01 ng/mL (ref 0.00–0.08)

## 2016-03-16 MED ORDER — METHYLPREDNISOLONE SODIUM SUCC 125 MG IJ SOLR
125.0000 mg | Freq: Once | INTRAMUSCULAR | Status: AC
  Administered 2016-03-16: 125 mg via INTRAVENOUS
  Filled 2016-03-16: qty 2

## 2016-03-16 MED ORDER — ALBUTEROL SULFATE HFA 108 (90 BASE) MCG/ACT IN AERS
2.0000 | INHALATION_SPRAY | RESPIRATORY_TRACT | Status: DC | PRN
  Administered 2016-03-16: 2 via RESPIRATORY_TRACT
  Filled 2016-03-16: qty 6.7

## 2016-03-16 MED ORDER — IPRATROPIUM-ALBUTEROL 0.5-2.5 (3) MG/3ML IN SOLN
3.0000 mL | Freq: Once | RESPIRATORY_TRACT | Status: AC
  Administered 2016-03-16: 3 mL via RESPIRATORY_TRACT
  Filled 2016-03-16: qty 3

## 2016-03-16 MED ORDER — AZITHROMYCIN 250 MG PO TABS
500.0000 mg | ORAL_TABLET | Freq: Once | ORAL | Status: AC
  Administered 2016-03-16: 500 mg via ORAL
  Filled 2016-03-16: qty 2

## 2016-03-16 MED ORDER — AZITHROMYCIN 250 MG PO TABS: ORAL_TABLET | ORAL | Status: DC

## 2016-03-16 MED ORDER — ALBUTEROL SULFATE (2.5 MG/3ML) 0.083% IN NEBU
2.5000 mg | INHALATION_SOLUTION | Freq: Once | RESPIRATORY_TRACT | Status: AC
Start: 2016-03-16 — End: 2016-03-16
  Administered 2016-03-16: 2.5 mg via RESPIRATORY_TRACT
  Filled 2016-03-16: qty 3

## 2016-03-16 MED ORDER — PREDNISONE 10 MG PO TABS: 20.0000 mg | ORAL_TABLET | Freq: Every day | ORAL | Status: DC

## 2016-03-16 MED ORDER — ALBUTEROL SULFATE (2.5 MG/3ML) 0.083% IN NEBU
2.5000 mg | INHALATION_SOLUTION | Freq: Once | RESPIRATORY_TRACT | Status: AC
  Administered 2016-03-16: 2.5 mg via RESPIRATORY_TRACT
  Filled 2016-03-16: qty 3

## 2016-03-16 NOTE — ED Notes (Signed)
Pt reports cough,sob since last night, denies cp.  Pt coughing up clear sputum.  Pt alert and oriented.

## 2016-03-16 NOTE — ED Provider Notes (Signed)
CSN: [id number]     Arrival date & time 03/16/16  1208 History  By signing my name below, I, Tara [name], attest that this documentation has been prepared under the direction and in the presence of Tara Ferguson, MD . Electronically Signed: Higinio [name], Scribe. 03/16/2016. 12:33 PM.   Chief Complaint  Patient presents with  . Shortness of Breath   Patient is a 70 y.o. female presenting with shortness of breath. The history is provided by the patient (Patient complains of cough and shortness of breath). No language interpreter was used.  Shortness of Breath Severity:  Mild Onset quality:  Gradual Duration:  1 day Timing:  Constant Progression:  Worsening Chronicity:  New Relieved by:  Nothing Worsened by:  Nothing tried Ineffective treatments:  None tried Associated symptoms: wheezing   Associated symptoms: no abdominal pain, no chest pain, no cough, no fever, no headaches and no rash     HPI Comments: Tara [name] is a 70 y.o. female with PMHx of breast cancer, SVT, HLD, and dysrhythmia, who presents to the Emergency Department complaining of gradually worsening, constant, shortness of breath that began yesterday morning. Pt reports she began coughing yesterday morning and believed she had a cold. She states she took cough medicine with no relief. She states associated symptoms of productive cough with clear sputum. She denies fever and chills. Pt states her last round of chemotherapy was in 2008 for right breast cancer; pt states she is in full remission.  Past Medical History  Diagnosis Date  . H/O: hysterectomy   . History of cancer chemotherapy   . History of radiation therapy   . Depression   . GERD (gastroesophageal reflux disease)   . SVT (supraventricular tachycardia) ([place])     short RP SVT documented 5/14  . Hiatal hernia   . Hyperlipidemia   . Nonischemic cardiomyopathy ([place])   . Breast cancer [facility]) 2008    right - s/p lumpectomy->chemo, radiation  . Dysrhythmia     hx  SVT  . Anxiety    Past Surgical History  Procedure Laterality Date  . Total abdominal hysterectomy w/ bilateral salpingoophorectomy  1994  . Cholecystectomy  1999  . Abdominal hysterectomy  1994    fibroids,   . Colonoscopy  2008    Dr. Oneida Alar: multiple polyps. Path not available at time of visit.   . Colonoscopy with propofol N/A 04/25/2014    Procedure: COLONOSCOPY WITH PROPOFOL;  Surgeon: Danie Binder, MD;  Location: [facility];  Service: Endoscopy;  Laterality: N/A;  Cecum time in 1132     time out 1203 total time 31 minutes  . Polypectomy N/A 04/25/2014    Procedure: POLYPECTOMY;  Surgeon: Danie Binder, MD;  Location: [facility];  Service: Endoscopy;  Laterality: N/A;  Ascending and Decending Colon x3 , Transverse colon x2, rectal  . Breast surgery Right 2008    lumpectomy, cancer   Family History  Problem Relation Age of Onset  . Colon cancer Father 23    died at age 57  . Cancer Father     colon   . Hypertension Sister   . Cancer Brother 33    prostate   Social History  Substance Use Topics  . Smoking status: Never Smoker   . Smokeless tobacco: None  . Alcohol Use: No   OB History    No data available     Review of Systems  Constitutional: Negative for fever, chills, appetite change and fatigue.  HENT:  Negative for congestion, ear discharge and sinus pressure.   Eyes: Negative for discharge.  Respiratory: Positive for shortness of breath and wheezing. Negative for cough.   Cardiovascular: Negative for chest pain.  Gastrointestinal: Negative for abdominal pain and diarrhea.  Genitourinary: Negative for frequency and hematuria.  Musculoskeletal: Negative for back pain.  Skin: Negative for rash.  Neurological: Negative for seizures and headaches.  Psychiatric/Behavioral: Negative for hallucinations.   Allergies  Aspirin and Sudafed  Home Medications   Prior to Admission medications   Medication Sig Start Date End Date Taking? Authorizing Provider  alendronate  (FOSAMAX) 70 MG tablet Take 1 tablet (70 mg total) by mouth every 7 (seven) days. Take with a full glass of water on an empty stomach. 11/12/15   [name], MD  azelastine (ASTELIN) 0.1 % nasal spray Place 2 sprays into both nostrils 2 (two) times daily. Use in each nostril as directed 10/09/15   [name], MD  cholecalciferol (VITAMIN D) 1000 UNITS tablet Take 1,000 Units by mouth daily.    Historical Provider, MD  clonazePAM (KLONOPIN) 0.5 MG tablet Take 1 tablet (0.5 mg total) by mouth at bedtime. 10/23/15   [name], MD  clotrimazole-betamethasone (LOTRISONE) cream Apply 1 application topically 2 (two) times daily. 04/12/15   [name], MD  docusate sodium (COLACE) 100 MG capsule Take 100 mg by mouth 2 (two) times daily.    Historical Provider, MD  fexofenadine (ALLEGRA) 180 MG tablet Take 180 mg by mouth as directed.     Historical Provider, MD  fluticasone (FLONASE) 50 MCG/ACT nasal spray Place 2 sprays into both nostrils daily. 11/26/15   [name], MD  HYDROcodone-acetaminophen (NORCO/VICODIN) 5-325 MG tablet 1 tab PO q12 hours prn pain 03/08/16   Francine Graven, DO  lovastatin (MEVACOR) 40 MG tablet Take 1 tablet (40 mg total) by mouth at bedtime. 02/12/16   [name], MD  methocarbamol (ROBAXIN) 500 MG tablet One tablet twice daily, as needed, for pain and spasm 03/14/16   [name], MD  metoCLOPramide (REGLAN) 10 MG tablet TAKE ONE CAPSULE BY MOUTH TWICE DAILY. 02/22/16   [name], MD  montelukast (SINGULAIR) 10 MG tablet TAKE (1) TABLET BY MOUTH AT BEDTIME. 02/22/16   [name], MD  pantoprazole (PROTONIX) 40 MG tablet Take 1 tablet (40 mg total) by mouth daily. 02/12/16   [name], MD  promethazine-dextromethorphan (PROMETHAZINE-DM) 6.25-15 MG/5ML syrup One teaspoon at bedtime, as needed, for excessive cough 02/17/16   [name], MD  sertraline (ZOLOFT) 50 MG tablet Take 1 tablet (50 mg total) by  mouth daily. 11/12/15   [name], MD  verapamil (CALAN-SR) 120 MG CR tablet Take 1 tablet (120 mg total) by mouth daily. 12/26/15   Thompson Grayer, MD  zolpidem (AMBIEN) 10 MG tablet Take 1 tablet (10 mg total) by mouth at bedtime as needed for sleep. 03/03/16   [name], MD   BP 152/79 mmHg  Pulse 102  Temp(Src) 100.1 F (37.8 C) (Oral)  Resp 24  Ht 4\' 11"  (1.499 m)  Wt 188 lb (85.276 kg)  BMI 37.95 kg/m2  SpO2 92% Physical Exam  Constitutional: She is oriented to person, place, and time. She appears well-developed.  HENT:  Head: Normocephalic.  Eyes: Conjunctivae and EOM are normal. No scleral icterus.  Neck: Neck supple. No thyromegaly present.  Cardiovascular: Normal rate and regular rhythm.  Exam reveals no gallop and no friction rub.  No murmur heard. Pulmonary/Chest: No stridor. She has wheezes. She has no rales. She exhibits no tenderness.  Moderate wheezing bilaterally.  Abdominal: She exhibits no distension. There is no tenderness. There is no rebound.  Musculoskeletal: Normal range of motion. She exhibits no edema.  Lymphadenopathy:    She has no cervical adenopathy.  Neurological: She is oriented to person, place, and time. She exhibits normal muscle tone. Coordination normal.  Skin: No rash noted. No erythema.  Psychiatric: She has a normal mood and affect. Her behavior is normal.    ED Course  Procedures  DIAGNOSTIC STUDIES:  Oxygen Saturation is 92% on RA, low by my interpretation.    COORDINATION OF CARE:  12:32 PM Discussed treatment [name], which includes CXR with pt at bedside and pt agreed to [name].  Labs Review Labs Reviewed - No data to display  Imaging Review No results found. I have personally reviewed and evaluated these images and lab results as part of my medical decision-making.   EKG Interpretation None      MDM   Final diagnoses:  None    Patient with cough and wheezing for couple days. Patient improved with neb  treatments. Suspect bronchitis with bronchospasm will send her home with albuterol prednisone and Zithromax and she is to follow-up with her PCP.     Tara Ferguson, MD 03/16/16 [phone]

## 2016-03-16 NOTE — ED Notes (Signed)
Ambulated Pt, Pt ambulated fine no dizziness or issues walking by her self. spo2 was 93 pluse was 117

## 2016-03-16 NOTE — Discharge Instructions (Signed)
Follow up with your md in 2 days °

## 2016-03-17 ENCOUNTER — Ambulatory Visit (INDEPENDENT_AMBULATORY_CARE_PROVIDER_SITE_OTHER): Payer: Medicare Other | Admitting: Family Medicine

## 2016-03-17 ENCOUNTER — Encounter: Payer: Self-pay | Admitting: Family Medicine

## 2016-03-17 VITALS — BP 126/80 | HR 92 | Resp 18 | Ht 59.0 in | Wt 189.0 lb

## 2016-03-17 DIAGNOSIS — E785 Hyperlipidemia, unspecified: Secondary | ICD-10-CM

## 2016-03-17 DIAGNOSIS — R0600 Dyspnea, unspecified: Secondary | ICD-10-CM

## 2016-03-17 DIAGNOSIS — Z9181 History of falling: Secondary | ICD-10-CM | POA: Diagnosis not present

## 2016-03-17 DIAGNOSIS — R053 Chronic cough: Secondary | ICD-10-CM | POA: Insufficient documentation

## 2016-03-17 DIAGNOSIS — Z0000 Encounter for general adult medical examination without abnormal findings: Secondary | ICD-10-CM

## 2016-03-17 DIAGNOSIS — R05 Cough: Secondary | ICD-10-CM | POA: Diagnosis not present

## 2016-03-17 DIAGNOSIS — E559 Vitamin D deficiency, unspecified: Secondary | ICD-10-CM

## 2016-03-17 DIAGNOSIS — Z09 Encounter for follow-up examination after completed treatment for conditions other than malignant neoplasm: Secondary | ICD-10-CM

## 2016-03-17 NOTE — Patient Instructions (Addendum)
F/u in 4 month, call if you need me sooner  Take medications prescribed in ED for bronchitis and wheezing  You are referred to lung specialist for further evaluation since you report a 4 month h/o cough I have reached out to your cardiologist as far as the concern of increased exertional fatigue  PLEASE BE VERY CAREFUL nOT TO FALL, LOOK wHERE YOU ARE GOING!    Fall Prevention in the Home  Falls can cause injuries. They can happen to people of all ages. There are many things you can do to make your home safe and to help prevent falls.  WHAT CAN I DO ON THE OUTSIDE OF MY HOME?  Regularly fix the edges of walkways and driveways and fix any cracks.  Remove anything that might make you trip as you walk through a door, such as a raised step or threshold.  Trim any bushes or trees on the path to your home.  Use bright outdoor lighting.  Clear any walking paths of anything that might make someone trip, such as rocks or tools.  Regularly check to see if handrails are loose or broken. Make sure that both sides of any steps have handrails.  Any raised decks and porches should have guardrails on the edges.  Have any leaves, snow, or ice cleared regularly.  Use sand or salt on walking paths during winter.  Clean up any spills in your garage right away. This includes oil or grease spills. WHAT CAN I DO IN THE BATHROOM?   Use night lights.  Install grab bars by the toilet and in the tub and shower. Do not use towel bars as grab bars.  Use non-skid mats or decals in the tub or shower.  If you need to sit down in the shower, use a plastic, non-slip stool.  Keep the floor dry. Clean up any water that spills on the floor as soon as it happens.  Remove soap buildup in the tub or shower regularly.  Attach bath mats securely with double-sided non-slip rug tape.  Do not have throw rugs and other things on the floor that can make you trip. WHAT CAN I DO IN THE BEDROOM?  Use night  lights.  Make sure that you have a light by your bed that is easy to reach.  Do not use any sheets or blankets that are too big for your bed. They should not hang down onto the floor.  Have a firm chair that has side arms. You can use this for support while you get dressed.  Do not have throw rugs and other things on the floor that can make you trip. WHAT CAN I DO IN THE KITCHEN?  Clean up any spills right away.  Avoid walking on wet floors.  Keep items that you use a lot in easy-to-reach places.  If you need to reach something above you, use a strong step stool that has a grab bar.  Keep electrical cords out of the way.  Do not use floor polish or wax that makes floors slippery. If you must use wax, use non-skid floor wax.  Do not have throw rugs and other things on the floor that can make you trip. WHAT CAN I DO WITH MY STAIRS?  Do not leave any items on the stairs.  Make sure that there are handrails on both sides of the stairs and use them. Fix handrails that are broken or loose. Make sure that handrails are as long as the stairways.  Check any carpeting to make sure that it is firmly attached to the stairs. Fix any carpet that is loose or worn.  Avoid having throw rugs at the top or bottom of the stairs. If you do have throw rugs, attach them to the floor with carpet tape.  Make sure that you have a light switch at the top of the stairs and the bottom of the stairs. If you do not have them, ask someone to add them for you. WHAT ELSE CAN I DO TO HELP PREVENT FALLS?  Wear shoes that:  Do not have high heels.  Have rubber bottoms.  Are comfortable and fit you well.  Are closed at the toe. Do not wear sandals.  If you use a stepladder:  Make sure that it is fully opened. Do not climb a closed stepladder.  Make sure that both sides of the stepladder are locked into place.  Ask someone to hold it for you, if possible.  Clearly mark and make sure that you can  see:  Any grab bars or handrails.  First and last steps.  Where the edge of each step is.  Use tools that help you move around (mobility aids) if they are needed. These include:  Canes.  Walkers.  Scooters.  Crutches.  Turn on the lights when you go into a dark area. Replace any light bulbs as soon as they burn out.  Set up your furniture so you have a clear path. Avoid moving your furniture around.  If any of your floors are uneven, fix them.  If there are any pets around you, be aware of where they are.  Review your medicines with your doctor. Some medicines can make you feel dizzy. This can increase your chance of falling. Ask your doctor what other things that you can do to help prevent falls.   This information is not intended to replace advice given to you by your health care provider. Make sure you discuss any questions you have with your health care provider.   Document Released: 07/05/2009 Document Revised: 01/23/2015 Document Reviewed: 10/13/2014 Elsevier Interactive Patient Education 2016 [employer] Fasting lipid, hepatic and vit D in 4 month

## 2016-03-17 NOTE — Progress Notes (Signed)
Subjective:    Patient ID: Tara [name], female    DOB: 1945/12/03, 70 y.o.   MRN: [id number]  HPI Preventive Screening-Counseling & Management   Patient present here today for a Medicare annual wellness visit. Also to follow up on recent ED visits,6/17 for accidental fall, and 6/26 for cough and wheezing Son accompanies pt and her spouse for the first time and expresses a lot of concern re noted increased exertional dyspnea and fatigue noted in the past 6 months. Pt reports 2 pillow orthopnea, denies chest pain with activity , or PND  Current Problems (verified)   Medications Prior to Visit Allergies (verified)   PAST HISTORY  Family History (updated)   Social History Married female x 47 years.  2 grown children (1 son and 1 daughter) Retired Marine scientist     Risk Factors  Current exercise habits:  Patient states that she is not currently physically active as she should be. Handout of chair exercises provided      Dietary issues discussed:  Patient eats fast food mostly.  Encouraged to increase fresh fruits and vegetables.    Cardiac risk factors:   Depression Screen  (Note: if answer to either of the following is "Yes", a more complete depression screening is indicated)  Actively sees psych   Over the past two weeks, have you felt down, depressed or hopeless? No  Over the past two weeks, have you felt little interest or pleasure in doing things? No  Have you lost interest or pleasure in daily life? No  Do you often feel hopeless? No  Do you cry easily over simple problems? No   Activities of Daily Living  In your present state of health, do you have any difficulty performing the following activities?  Driving?: No but limits night time driving  Managing money?: No Feeding yourself?:No Getting from bed to chair?:No Climbing a flight of stairs?yes, short of breath, noted in particular by family members, son and spouse that she gets out of breath more easily in past 4 to 6  months Preparing food and eating?:No Bathing or showering?:No Getting dressed?:No Getting to the toilet?:No Using the toilet?:No Moving around from place to place?: No  Fall Risk Assessment In the past year have you fallen or had a near fall?: Yes once missed a step Are you currently taking any medications that make you dizzy?No   Hearing Difficulties: No Do you often ask people to speak up or repeat themselves?:No Do you experience ringing or noises in your ears?:No Do you have difficulty understanding soft or whispered voices?:No  Cognitive Testing  Alert? Yes Normal Appearance?Yes  Oriented to person? Yes Place? Yes  Time? Yes  Displays appropriate judgment?Yes  Can read the correct time from a watch face? yes Are you having problems remembering things?No age appropriate  Advanced Directives have been discussed with the patient?Yes and brochure/forms provided , full code   List the Names of Other Physician/Practitioners you currently use: care teams up to date    Indicate any recent Medical Services you may have received from other than [facility] providers in the past year (date may be approximate).   Assessment:    Annual Wellness Exam   Plan:        Patient Instructions (the written plan) was given to the patient.  Medicare Attestation  I have personally reviewed:  The patient's medical and social history  Their use of alcohol, tobacco or illicit drugs  Their current medications and supplements  The  patient's functional ability including ADLs,fall risks, home safety risks, cognitive, and hearing and visual impairment  Diet and physical activities  Evidence for depression or mood disorders  The patient's weight, height, BMI, and visual acuity have been recorded in the chart. I have made referrals, counseling, and provided education to the patient based on review of the above and I have provided the patient with a written personalized care plan for preventive  services.      Review of Systems Patient in for follow up of recent Ed visits x 2  Discharge summary, and laboratory and radiology data are reviewed, and any questions or concerns about recent hospitalization are discussed. Specific issues requiring follow up are specifically addressed.     Objective:   Physical Exam BP 126/80 mmHg  Pulse 92  Resp 18  Ht 4\' 11"  (1.499 m)  Wt 189 lb (85.73 kg)  BMI 38.15 kg/m2  SpO2 90%  Chest: Adequate air entry bilaterally, no crackles, few wheezes CVs: heart sounds S1 and s2 , no S3 , no murmur. No JVD, no leg edema  MS: full ROM of knees , elbows and hands, minimal bruising evident over knees, reports marked improvement      Assessment & Plan:  Medicare annual wellness visit, subsequent Annual exam as documented. Counseling done  re healthy lifestyle involving commitment to 150 minutes exercise per week, heart healthy diet, and attaining healthy weight.The importance of adequate sleep also discussed. Regular seat belt use and home safety, is also discussed. Changes in health habits are decided on by the patient with goals and time frames  set for achieving them. Immunization and cancer screening needs are specifically addressed at this visit.   Chronic coughing 6 month h/o cough at times associated with wheezing per history. Physical exam at visit , no crackles , few high pitched wheezes, adequate air entry Recently in ED with diagnosis of bronchitis and wheezing CXR showed clear lung fields, and reported interstitial edema. Will alert cardiologist to this for further evaluation of heart as there is also c/o exertional dyspnea and 2 pillow orthopnea. Will refer to pulmonary also for pulmonary evaluation  H/O fall Fal following the fall , which occurred when on a trip she missed a step, looking for her spouse who had been holding her handl prevention and home safety discussed.Was evaluated in the ED. No fractures at initial injury,  bruising initially, no skin breakdown , improved , not yet 100% better  Dyspnea 6 month history of increased exertional fatigue and exercise intolerance noted by son who is present. Pt reports 2 pillow orthopnea, no consistent c/o chest pain. Needs re eval by cardiology, as recent CXR also showed interstitial edema, message sent to her cardiologist who she recently saw  Encounter for examination following treatment at hospital Records reviewed of her 2 ED visits this past month and pt examined in affected systems , findings discussed with patient and family and treatment/ management plan developed, as discussed

## 2016-03-21 ENCOUNTER — Ambulatory Visit (HOSPITAL_COMMUNITY)
Admission: RE | Admit: 2016-03-21 | Discharge: 2016-03-21 | Disposition: A | Discharge status: Home / Self Care | Payer: Medicare Other | Source: Ambulatory Visit | Attending: Internal Medicine | Admitting: Internal Medicine

## 2016-03-21 DIAGNOSIS — I517 Cardiomegaly: Secondary | ICD-10-CM | POA: Insufficient documentation

## 2016-03-21 DIAGNOSIS — I428 Other cardiomyopathies: Secondary | ICD-10-CM | POA: Insufficient documentation

## 2016-03-21 DIAGNOSIS — K219 Gastro-esophageal reflux disease without esophagitis: Secondary | ICD-10-CM | POA: Diagnosis not present

## 2016-03-21 DIAGNOSIS — I429 Cardiomyopathy, unspecified: Secondary | ICD-10-CM

## 2016-03-21 DIAGNOSIS — E785 Hyperlipidemia, unspecified: Secondary | ICD-10-CM | POA: Diagnosis not present

## 2016-03-21 DIAGNOSIS — I340 Nonrheumatic mitral (valve) insufficiency: Secondary | ICD-10-CM | POA: Diagnosis not present

## 2016-03-21 DIAGNOSIS — C50911 Malignant neoplasm of unspecified site of right female breast: Secondary | ICD-10-CM | POA: Diagnosis not present

## 2016-03-21 LAB — ECHOCARDIOGRAM COMPLETE
AOVTI: 26.3 cm
AV Peak grad: 5 mmHg
AV VEL mean LVOT/AV: 0.71
AV area mean vel ind: 1.24 cm2/m2
AV vel: 1.99
AVAREAMEANV: 2.23 cm2
AVAREAVTI: 2.19 cm2
AVAREAVTIIND: 1.11 cm2/m2
AVG: 2 mmHg
AVLVOTPG: 2 mmHg
AVPKVEL: 107 cm/s
Ao pk vel: 0.7 m/s
CHL CUP AV PEAK INDEX: 1.22
CHL CUP AV VALUE AREA INDEX: 1.11
CHL CUP DOP CALC LVOT VTI: 16.7 cm
CHL CUP STROKE VOLUME: 35 mL
DOP CAL AO MEAN VELOCITY: 69.3 cm/s
EERAT: 12.24
EWDT: 162 ms
FS: 16 % — AB (ref 28–44)
IV/PV OW: 0.93
LA ID, A-P, ES: 40 mm
LA diam end sys: 40 mm
LA diam index: 2.22 cm/m2
LA vol: 72.2 mL
LAVOLA4C: 65.6 mL
LAVOLIN: 40.1 mL/m2
LV E/e' medial: 12.24
LV E/e'average: 12.24
LV PW d: 9.83 mm — AB (ref 0.6–1.1)
LV TDI E'LATERAL: 7.65
LV dias vol index: 49 mL/m2
LV sys vol: 53 mL — AB (ref 14–42)
LVDIAVOL: 88 mL (ref 46–106)
LVELAT: 7.65 cm/s
LVOT area: 3.14 cm2
LVOT peak VTI: 0.63 cm
LVOTD: 20 mm
LVOTPV: 74.6 cm/s
LVOTSV: 52 mL
LVSYSVOLIN: 30 mL/m2
MV Dec: 162
MV pk A vel: 39.8 m/s
MV pk E vel: 93.6 m/s
MVPG: 4 mmHg
RV TAPSE: 21.6 mm
Reg peak vel: 340 cm/s
Simpson's disk: 40
TDI e' medial: 4.43
TR max vel: 340 cm/s
Valve area: 1.99 cm2

## 2016-03-21 NOTE — Progress Notes (Signed)
*  PRELIMINARY RESULTS* Echocardiogram 2D Echocardiogram has been performed.  Tara [name] 03/21/2016, 11:47 AM

## 2016-03-23 DIAGNOSIS — Z9181 History of falling: Secondary | ICD-10-CM | POA: Insufficient documentation

## 2016-03-23 DIAGNOSIS — R0600 Dyspnea, unspecified: Secondary | ICD-10-CM | POA: Insufficient documentation

## 2016-03-23 DIAGNOSIS — Z09 Encounter for follow-up examination after completed treatment for conditions other than malignant neoplasm: Secondary | ICD-10-CM | POA: Insufficient documentation

## 2016-03-23 NOTE — Assessment & Plan Note (Signed)

## 2016-03-23 NOTE — Assessment & Plan Note (Signed)
6 month history of increased exertional fatigue and exercise intolerance noted by son who is present. Pt reports 2 pillow orthopnea, no consistent c/o chest pain. Needs re eval by cardiology, as recent CXR also showed interstitial edema, message sent to her cardiologist who she recently saw

## 2016-03-23 NOTE — Assessment & Plan Note (Signed)
Fal following the fall , which occurred when on a trip she missed a step, looking for her spouse who had been holding her handl prevention and home safety discussed.Was evaluated in the ED. No fractures at initial injury, bruising initially, no skin breakdown , improved , not yet 100% better

## 2016-03-23 NOTE — Assessment & Plan Note (Signed)
Records reviewed of her 2 ED visits this past month and pt examined in affected systems , findings discussed with patient and family and treatment/ management plan developed, as discussed

## 2016-03-23 NOTE — Assessment & Plan Note (Signed)
6 month h/o cough at times associated with wheezing per history. Physical exam at visit , no crackles , few high pitched wheezes, adequate air entry Recently in ED with diagnosis of bronchitis and wheezing CXR showed clear lung fields, and reported interstitial edema. Will alert cardiologist to this for further evaluation of heart as there is also c/o exertional dyspnea and 2 pillow orthopnea. Will refer to pulmonary also for pulmonary evaluation

## 2016-03-26 ENCOUNTER — Other Ambulatory Visit: Payer: Self-pay | Admitting: Internal Medicine

## 2016-03-27 ENCOUNTER — Telehealth: Payer: Self-pay | Admitting: Internal Medicine

## 2016-03-27 NOTE — Telephone Encounter (Signed)
Calling back to discuss further the echo results.  States she was told this morning that her function was 35% and she is concerned about what she should be doing.  Advised that in 2014 EF was 40% so has decreased enough that needs to be addressed.  Will be seeing Tara [name] on 8/2 to discuss further plan and evaluation.  She understands and will wait to see Tara [name].

## 2016-03-27 NOTE — Telephone Encounter (Signed)
New Message  Pt would like to discuss her echo results further- had spoken w/ RN today. Please call back and discuss.

## 2016-04-09 ENCOUNTER — Encounter: Payer: Self-pay | Admitting: Internal Medicine

## 2016-04-09 ENCOUNTER — Ambulatory Visit (INDEPENDENT_AMBULATORY_CARE_PROVIDER_SITE_OTHER): Payer: Medicare Other | Admitting: Internal Medicine

## 2016-04-09 ENCOUNTER — Ambulatory Visit (INDEPENDENT_AMBULATORY_CARE_PROVIDER_SITE_OTHER)
Admission: RE | Admit: 2016-04-09 | Discharge: 2016-04-09 | Disposition: A | Discharge status: Home / Self Care | Payer: Medicare Other | Source: Ambulatory Visit | Attending: Internal Medicine | Admitting: Internal Medicine

## 2016-04-09 VITALS — BP 126/80 | HR 81 | Ht <= 58 in | Wt 188.0 lb

## 2016-04-09 DIAGNOSIS — R0600 Dyspnea, unspecified: Secondary | ICD-10-CM

## 2016-04-09 DIAGNOSIS — R0602 Shortness of breath: Secondary | ICD-10-CM | POA: Diagnosis not present

## 2016-04-09 DIAGNOSIS — R05 Cough: Secondary | ICD-10-CM

## 2016-04-09 DIAGNOSIS — R058 Other specified cough: Secondary | ICD-10-CM

## 2016-04-09 MED ORDER — FAMOTIDINE 20 MG PO TABS: ORAL_TABLET | ORAL | Status: DC

## 2016-04-09 NOTE — Assessment & Plan Note (Addendum)
This is either asthma that resolved p very short course of rx or more likely   Upper airway cough syndrome, so named because it's frequently impossible to sort out how much is  CR/sinusitis with freq throat clearing (which can be related to primary GERD)   vs  causing  secondary (" extra esophageal")  GERD from wide swings in gastric pressure that occur with throat clearing, often  promoting self use of mint and menthol lozenges that reduce the lower esophageal sphincter tone and exacerbate the problem further in a cyclical fashion.   These are the same pts (now being labeled as having "irritable larynx syndrome" by some cough centers) who not infrequently have a history of having failed to tolerate ace inhibitors,  dry powder inhalers or biphosphonates or report having atypical reflux symptoms that don't respond to standard doses of PPI (so could have 3 of the 4 risk factors) and are easily confused as having aecopd or asthma flares by even experienced allergists/ pulmonologists.   Try off biphosphonates for now, on gerd rx and f/u with pfts in 6 weeks to complete the w/u and in meantime just use prn albuterol .

## 2016-04-09 NOTE — Patient Instructions (Addendum)
Please remember to go to the x-ray department downstairs for your tests - we will call you with the results when they are available.  Leave off fosfamax for now   Pantoprazole (protonix) 40 mg   Take  30-60 min before first meal of the day and Pepcid ac (famotidine)  20 mg one @  bedtime until return to office - this is the best way to tell whether stomach acid is contributing to your problem.    GERD (REFLUX)  is an extremely common cause of respiratory symptoms just like yours , many times with no obvious heartburn at all.    It can be treated with medication, but also with lifestyle changes including elevation of the head of your bed (ideally with 6 inch  bed blocks),  Smoking cessation, avoidance of late meals, excessive alcohol, and avoid fatty foods, chocolate, peppermint, colas, red wine, and acidic juices such as orange juice.  NO MINT OR MENTHOL PRODUCTS SO NO COUGH DROPS  USE SUGARLESS CANDY INSTEAD (Jolley ranchers or Stover's or Life Savers) or even ice chips will also do - the key is to swallow to prevent all throat clearing. NO OIL BASED VITAMINS - use powdered substitutes.      Please schedule a follow up office visit in 6 weeks, call sooner if needed with PFTs on return

## 2016-04-09 NOTE — Progress Notes (Signed)
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   04/09/2016 1st [facility] office visit/ Ayaat Jansma  Cough from lisinopril Chief Complaint  Patient presents with  . Pulmonary Consult     Referred by Dr. [name]. Pt c/o cough and SOB for the past 6 months, worse since June 2017. She states she gets SOB just walking from room to room at home. She has to sleep propped up. Her cough is occ prod with clear sputum.   for the last year more difficulty going across parking lots but much worse since June 2017 was able able to walk up a flight of steps s difficulty from basement to first floor but gained 15 lb over same time a stopped walking regularly with friends x years. cough onset x 6 months Breathing bad since June 2017 to point where sob > ER  03/16/16 but better now and not even usuing saba x weeks. rx fosfamax x 3 years  Better p albuterol x 3 days only  Now to sleep on 2 pillows where previously needed a 3rd pillow   No obvious day to day or daytime variability or assoc excess/ purulent sputum or mucus plugs  cp or chest tightness, subjective wheeze or overt sinus symptoms. No unusual exp hx or h/o childhood  asthma or knowledge of premature birth.  ? TB as child?   Sleeping ok without nocturnal  or early am exacerbation  of respiratory  c/o's or need for noct saba. Also denies any obvious fluctuation of symptoms with weather or environmental changes or other aggravating or alleviating factors except as outlined above   Current Medications, Allergies, Complete  Past Medical History, Past Surgical History, Family History, and Social History were reviewed in [employer] record.  ROS  The following are not active complaints unless bolded sore throat, dysphagia, dental problems, itching, sneezing,  nasal congestion or excess/ purulent secretions, ear ache,   fever, chills, sweats, unintended wt loss, classically pleuritic or exertional cp, hemoptysis,  orthopnea pnd or leg swelling, presyncope, palpitations, abdominal pain, anorexia, nausea, vomiting, diarrhea  or change in bowel or bladder habits, change in stools or urine, dysuria,hematuria,  rash, arthralgias, visual complaints, headache, numbness, weakness or ataxia or problems with walking or coordination,  change in mood/affect or memory.            Review of Systems  Constitutional: Negative.  Negative for fever, chills and unexpected weight change.  HENT: Positive for congestion and sneezing. Negative for dental problem, ear pain, nosebleeds, postnasal drip, rhinorrhea, sinus pressure, sore throat, trouble swallowing and voice change.   Eyes: Negative.  Negative for visual disturbance.  Respiratory: Positive for cough and shortness of breath. Negative for apnea, choking, chest tightness, wheezing and stridor.   Cardiovascular: Negative.  Negative for chest pain, palpitations and leg swelling.  Gastrointestinal: Negative.  Negative for nausea, vomiting, abdominal pain, diarrhea and abdominal distention.       Heartburn indigestion  Genitourinary: Negative.  Negative for difficulty urinating.  Musculoskeletal: Negative.  Negative for myalgias and arthralgias.  Skin: Negative.  Negative for rash.  Allergic/Immunologic: Negative.  Negative for environmental allergies and food allergies.  Neurological: Negative.  Negative for dizziness, tremors, syncope, weakness and headaches.  Hematological: Negative.  Negative for adenopathy. Does not bruise/bleed easily.    Psychiatric/Behavioral: Negative.  Negative for sleep disturbance and agitation. The patient is not nervous/anxious.        Objective:   Physical Exam  amb bf nad   Wt Readings from Last 3 Encounters:  04/09/16 188 lb (85.276 kg)  03/17/16 189 lb (85.73 kg)  03/16/16 188 lb (85.276 kg)    Vital signs reviewed     HEENT: nl dentition, turbinates, and oropharynx. Nl external ear canals without cough reflex   NECK :  without JVD/Nodes/TM/ nl carotid upstrokes bilaterally   LUNGS: no acc muscle use,  Nl contour chest which is clear to A and P bilaterally without cough on insp or exp maneuvers   CV:  RRR  no s3 or murmur or increase in P2,  Trace bilateral pitting ankle  edema   ABD:  soft and nontender with nl inspiratory excursion in the supine position. No bruits or organomegaly, bowel sounds nl  MS:  Nl gait/ ext warm without deformities, calf tenderness, cyanosis or clubbing No obvious joint restrictions   SKIN: warm and dry without lesions    NEURO:  alert, approp, nl sensorium with  no motor deficits      CXR PA and Lateral:   04/09/2016 :    I personally reviewed images and agree with radiology impression as follows:   Minimal patchy density in the left base/retrocardiac region which may be due to atelectasis or infection. Evidence of prior granulomas disease (my imp: with extensive calcified nodes Mild cardiomegaly. My impression:  Int edema resolved on present cxr vs 03/16/16   BNP 03/16/16 = 227      Assessment & Plan:

## 2016-04-10 NOTE — Progress Notes (Signed)
Quick Note:  Spoke with pt and notified of results per Dr. Wert. Pt verbalized understanding and denied any questions.  ______ 

## 2016-04-10 NOTE — Assessment & Plan Note (Signed)
Echo 03/21/16  Left ventricle: The cavity size was mildly dilated. Wall  thickness was normal. Systolic function was moderately reduced.  The estimated ejection fraction was in the range of 35% to 40%.  The study is not technically sufficient to allow evaluation of LV  diastolic function. - Aortic valve: Valve area (VTI): 1.99 cm^2. Valve area (Vmax):  2.19 cm^2. Valve area (Vmean): 2.23 cm^2. - Mitral valve: There was mild regurgitation. - Left atrium: The atrium was mildly to moderately dilated. - Pulmonary arteries: PA peak pressure: 49 mm Hg (S).   Clearly better p rx for AB with pred/abx/alb but ? Whether most of her chronic and maybe even some of her acute symptoms were not in retrospect cardiac asthma > she has cards eval in progress and we need to see her back here once it's complete if she's not back to baseline but no further w/u for now.   Total time devoted to counseling  = 35/8m review case with pt/fm/ER and office notes and discussion of options/alternatives/ personally creating written instructions  in presence of pt  then going over those specific  Instructions directly with the pt including how to use all of the meds but in particular covering each new medication in detail and the difference between the maintenance/automatic meds and the prns using an action plan format for the latter.

## 2016-04-18 ENCOUNTER — Other Ambulatory Visit: Payer: Self-pay | Admitting: Family Medicine

## 2016-04-23 ENCOUNTER — Other Ambulatory Visit: Payer: Self-pay | Admitting: *Deleted

## 2016-04-23 ENCOUNTER — Ambulatory Visit (INDEPENDENT_AMBULATORY_CARE_PROVIDER_SITE_OTHER): Payer: Medicare Other | Admitting: Nurse Practitioner

## 2016-04-23 ENCOUNTER — Encounter: Payer: Self-pay | Admitting: Nurse Practitioner

## 2016-04-23 VITALS — BP 128/90 | HR 76 | Ht <= 58 in | Wt 188.1 lb

## 2016-04-23 DIAGNOSIS — I428 Other cardiomyopathies: Secondary | ICD-10-CM

## 2016-04-23 DIAGNOSIS — I429 Cardiomyopathy, unspecified: Secondary | ICD-10-CM | POA: Diagnosis not present

## 2016-04-23 DIAGNOSIS — I471 Supraventricular tachycardia: Secondary | ICD-10-CM | POA: Diagnosis not present

## 2016-04-23 LAB — BASIC METABOLIC PANEL
BUN: 15 mg/dL (ref 7–25)
CO2: 25 mmol/L (ref 20–31)
Calcium: 9.1 mg/dL (ref 8.6–10.4)
Chloride: 104 mmol/L (ref 98–110)
Creat: 1.13 mg/dL — ABNORMAL HIGH (ref 0.60–0.93)
Glucose, Bld: 80 mg/dL (ref 65–99)
Potassium: 4.4 mmol/L (ref 3.5–5.3)
Sodium: 139 mmol/L (ref 135–146)

## 2016-04-23 LAB — CBC
HCT: 39 % (ref 35.0–45.0)
Hemoglobin: 12.6 g/dL (ref 11.7–15.5)
MCH: 26.2 pg — ABNORMAL LOW (ref 27.0–33.0)
MCHC: 32.3 g/dL (ref 32.0–36.0)
MCV: 81.1 fL (ref 80.0–100.0)
MPV: 9.8 fL (ref 7.5–12.5)
Platelets: 241 10*3/uL (ref 140–400)
RBC: 4.81 MIL/uL (ref 3.80–5.10)
RDW: 15.2 % — ABNORMAL HIGH (ref 11.0–15.0)
WBC: 9.9 10*3/uL (ref 3.8–10.8)

## 2016-04-23 MED ORDER — LOSARTAN POTASSIUM 25 MG PO TABS: 25.0000 mg | ORAL_TABLET | Freq: Every day | ORAL | 3 refills | Status: DC

## 2016-04-23 NOTE — Progress Notes (Addendum)
CARDIOLOGY OFFICE NOTE  Date:  04/23/2016    Tara [name] Date of Birth: April 27, 1946 Medical Record [id number]  PCP:  Tara Nakayama, MD  Cardiologist:  Allred   Chief Complaint  Patient presents with  . Cardiomyopathy  . Congestive Heart Failure    Follow up after echo - seen for Dr. Rayann [name]    History of Present Illness: Tara [name] is a 70 y.o. female who presents today for a post echo visit. Seen for Dr. Rayann [name].   She has a history of SVT, prior NICM and HLD. She has had history of breast cancer and has had chemo/radiation/surgery.  Seen 2 months ago - felt to be doing ok. Echo was updated. EF has worsened and she was asked to come back here.  Comes in today. Here with her husband. She says she is doing ok. No chest pain. She will have shortness of breath with exertion. Husband says she spends a lot of time on the couch. Seems limited by her breathing. Some swelling. Getting too much salt - eats out. Not dizzy. She says she has had a cough with Lisinopril in the past. She fell about a month ago - missed a step - fortunately, did not get hurt. Her rhythm has been ok.   Past Medical History:  Diagnosis Date  . Anxiety   . Breast cancer [facility]) 2008   right - s/p lumpectomy->chemo, radiation  . Depression   . Dysrhythmia    hx SVT  . GERD (gastroesophageal reflux disease)   . H/O: hysterectomy   . Hiatal hernia   . History of cancer chemotherapy   . History of radiation therapy   . Hyperlipidemia   . Nonischemic cardiomyopathy ([place])   . SVT (supraventricular tachycardia) ([place])    short RP SVT documented 5/14    Past Surgical History:  Procedure Laterality Date  . ABDOMINAL HYSTERECTOMY  1994   fibroids,   . BREAST SURGERY Right 2008   lumpectomy, cancer  . CHOLECYSTECTOMY  1999  . COLONOSCOPY  2008   Dr. Oneida Alar: multiple polyps. Path not available at time of visit.   Marland Kitchen COLONOSCOPY WITH PROPOFOL N/A 04/25/2014   Procedure: COLONOSCOPY WITH PROPOFOL;   Surgeon: Danie Binder, MD;  Location: [facility];  Service: Endoscopy;  Laterality: N/A;  Cecum time in 1132     time out 1203 total time 31 minutes  . POLYPECTOMY N/A 04/25/2014   Procedure: POLYPECTOMY;  Surgeon: Danie Binder, MD;  Location: [facility];  Service: Endoscopy;  Laterality: N/A;  Ascending and Decending Colon x3 , Transverse colon x2, rectal  . TOTAL ABDOMINAL HYSTERECTOMY W/ BILATERAL SALPINGOOPHORECTOMY  1994     Medications: Current Outpatient Prescriptions  Medication Sig Dispense Refill  . azelastine (ASTELIN) 0.1 % nasal spray Place 2 sprays into both nostrils 2 (two) times daily. Use in each nostril as directed 30 mL 12  . clonazePAM (KLONOPIN) 0.5 MG tablet TAKE (1) TABLET BY MOUTH AT BEDTIME. 90 tablet 0  . docusate sodium (COLACE) 100 MG capsule Take 100 mg by mouth 2 (two) times daily.    . famotidine (PEPCID) 20 MG tablet One at bedtime    . fexofenadine (ALLEGRA) 180 MG tablet Take 180 mg by mouth daily as needed.     . fluticasone (FLONASE) 50 MCG/ACT nasal spray Place 2 sprays into both nostrils daily. 16 g 2  . lovastatin (MEVACOR) 40 MG tablet Take 1 tablet (40 mg total) by mouth at bedtime.  90 tablet 1  . metoCLOPramide (REGLAN) 10 MG tablet TAKE ONE CAPSULE BY MOUTH TWICE DAILY. 180 tablet 1  . pantoprazole (PROTONIX) 40 MG tablet Take 1 tablet (40 mg total) by mouth daily. 90 tablet 1  . sertraline (ZOLOFT) 50 MG tablet Take 1 tablet (50 mg total) by mouth daily. 90 tablet 1  . verapamil (CALAN-SR) 120 MG CR tablet TAKE 1 TABLET BY MOUTH ONCE DAILY. 90 tablet 3  . zolpidem (AMBIEN) 10 MG tablet Take 1 tablet (10 mg total) by mouth at bedtime as needed for sleep. 90 tablet 1  . losartan (COZAAR) 25 MG tablet Take 1 tablet (25 mg total) by mouth daily. 30 tablet 3   No current facility-administered medications for this visit.     Allergies: Allergies  Allergen Reactions  . Aspirin Other (See Comments)    Reports GI bleed with daily use  . Lisinopril Cough    . Sudafed [Pseudoephedrine Hcl]     Pt reports she had drainage in throat that made her throat hurt.     Social History: The patient  reports that she has never smoked. She does not have any smokeless tobacco history on file. She reports that she does not drink alcohol or use drugs.   Family History: The patient's family history includes Cancer in her father; Cancer (age of onset: 41) in her brother; Colon cancer (age of onset: 46) in her father; Hypertension in her sister.   Review of Systems: Please see the history of present illness.   Otherwise, the review of systems is positive for none.   All other systems are reviewed and negative.   Physical Exam: VS:  BP 128/90   Pulse 76   Ht 4\' 9"  (1.448 m)   Wt 188 lb 1.9 oz (85.3 kg)   BMI 40.71 kg/m  .  BMI Body mass index is 40.71 kg/m.  Wt Readings from Last 3 Encounters:  04/23/16 188 lb 1.9 oz (85.3 kg)  04/09/16 188 lb (85.3 kg)  03/17/16 189 lb (85.7 kg)    General: Pleasant. Well developed, well nourished and in no acute distress.  She seems pale to me.  HEENT: Normal.  Neck: Supple, no JVD, carotid bruits, or masses noted.  Cardiac: Regular rate and rhythm. Heart tones are distant. Trace edema.  Respiratory:  Lungs are clear to auscultation bilaterally with normal work of breathing.  GI: Soft and nontender.  MS: No deformity or atrophy. Gait and ROM intact.  Skin: Warm and dry. Color is normal.  Neuro:  Strength and sensation are intact and no gross focal deficits noted.  Psych: Alert, appropriate and with normal affect.   LABORATORY DATA:  EKG:  EKG is not ordered today.  Lab Results  Component Value Date   WBC 7.5 03/16/2016   HGB 11.3 (L) 03/16/2016   HCT 34.9 (L) 03/16/2016   PLT 228 03/16/2016   GLUCOSE 102 (H) 03/16/2016   CHOL 205 (H) 10/09/2015   TRIG 135 10/09/2015   HDL 59 10/09/2015   LDLCALC 119 10/09/2015   ALT 17 03/16/2016   AST 24 03/16/2016   NA 140 03/16/2016   K 3.3 (L) 03/16/2016    CL 108 03/16/2016   CREATININE 0.91 03/16/2016   BUN 13 03/16/2016   CO2 26 03/16/2016   TSH 3.956 10/09/2015   HGBA1C 5.8 (H) 10/09/2015    BNP (last 3 results)  Recent Labs  03/16/16 1236  BNP 227.0*    ProBNP (last 3 results) No  results for input(s): PROBNP in the last 8760 hours.   Other Studies Reviewed Today:  Echo Study Conclusions from 02/2016  - Left ventricle: The cavity size was mildly dilated. Wall   thickness was normal. Systolic function was moderately reduced.   The estimated ejection fraction was in the range of 35% to 40%.   The study is not technically sufficient to allow evaluation of LV   diastolic function. - Aortic valve: Valve area (VTI): 1.99 cm^2. Valve area (Vmax):   2.19 cm^2. Valve area (Vmean): 2.23 cm^2. - Mitral valve: There was mild regurgitation. - Left atrium: The atrium was mildly to moderately dilated. - Pulmonary arteries: PA peak pressure: 49 mm Hg (S).  Assessment/Plan: 1. Worsening LV function - not clear to me what prior work up was done - will get Myoview to rule out ischemia as etiology - add low dose ARB. See back in a month and try to titrate her medicines. Need Dr. Jackalyn Lombard input about her Verapamil. ? Try to change to beta blocker now that we have worsening heart failure. Would consider repeat echo after being on max therapy.   2. Chronic systolic HF - she seems to be more limited but not really clear to me yet if this is just from being sedentary or if it is heart failure. Check BNP  3. Prior history of SVT  4. HTN - BP fair - adding low dose ARB today. Lab on return. She is ACE allergic.   5. Prior history of breast cancer.   Current medicines are reviewed with the patient today.  The patient does not have concerns regarding medicines other than what has been noted above.  The following changes have been made:  See above.  Labs/ tests ordered today include:    Orders Placed This Encounter  Procedures  . Brain  natriuretic peptide  . Basic metabolic panel  . CBC     Disposition:   FU with me in one month.   Patient is agreeable to this plan and will call if any problems develop in the interim.   Signed: Burtis Junes, RN, ANP-C 04/23/2016 3:04 PM  [facility] [address] [place], [place]  [address] Phone: [phone] Fax: [phone]       Addendum:  Thompson Grayer, MD  Burtis Junes, NP        We should try beta blocker and see how she does. Go ahead and switch to coreg.

## 2016-04-23 NOTE — Patient Instructions (Addendum)
We will be checking the following labs today - BMET, BNP and CBC   Medication Instructions:    Continue with your current medicines. BUT  I am adding Losartan 25 mg to take one a day - this has been sent to your drug store    Testing/Procedures To Be Arranged:  Lexiscan Myoview - this is to make sure you don't have blocked arteries that is making your heart muscle weaker  Follow-Up:   See me in one month with BMET    Other Special Instructions:   Your pumping function has gotten worse - hopefully we can add some medicine and this will improve - you have what is called "Systolic Heart Failure"  Restrict your salt - this will help prevent your swelling    If you need a refill on your cardiac medications before your next appointment, please call your pharmacy.   Call the [place] office at [phone] if you have any questions, problems or concerns.

## 2016-04-24 LAB — BRAIN NATRIURETIC PEPTIDE: Brain Natriuretic Peptide: 80.7 pg/mL (ref <100)

## 2016-05-01 ENCOUNTER — Ambulatory Visit (HOSPITAL_COMMUNITY): Payer: Medicare Other | Attending: Cardiovascular Disease

## 2016-05-01 DIAGNOSIS — R9439 Abnormal result of other cardiovascular function study: Secondary | ICD-10-CM | POA: Diagnosis not present

## 2016-05-01 DIAGNOSIS — I428 Other cardiomyopathies: Secondary | ICD-10-CM

## 2016-05-01 DIAGNOSIS — R0609 Other forms of dyspnea: Secondary | ICD-10-CM | POA: Diagnosis not present

## 2016-05-01 DIAGNOSIS — I429 Cardiomyopathy, unspecified: Secondary | ICD-10-CM | POA: Diagnosis not present

## 2016-05-01 LAB — MYOCARDIAL PERFUSION IMAGING
LV dias vol: 131 mL (ref 46–106)
LV sys vol: 86 mL
Peak HR: 95 {beats}/min
RATE: 0.39
Rest HR: 70 {beats}/min
SDS: 2
SRS: 2
SSS: 4
TID: 0.97

## 2016-05-01 MED ORDER — TECHNETIUM TC 99M TETROFOSMIN IV KIT
33.0000 | PACK | Freq: Once | INTRAVENOUS | Status: AC | PRN
  Administered 2016-05-01: 33 via INTRAVENOUS
  Filled 2016-05-01: qty 33

## 2016-05-01 MED ORDER — REGADENOSON 0.4 MG/5ML IV SOLN
0.4000 mg | Freq: Once | INTRAVENOUS | Status: AC
  Administered 2016-05-01: 0.4 mg via INTRAVENOUS

## 2016-05-01 MED ORDER — TECHNETIUM TC 99M TETROFOSMIN IV KIT
11.0000 | PACK | Freq: Once | INTRAVENOUS | Status: AC | PRN
  Administered 2016-05-01: 11 via INTRAVENOUS
  Filled 2016-05-01: qty 11

## 2016-05-02 ENCOUNTER — Ambulatory Visit (HOSPITAL_COMMUNITY): Payer: Medicare Other

## 2016-05-13 ENCOUNTER — Encounter: Payer: Self-pay | Admitting: Nurse Practitioner

## 2016-05-28 ENCOUNTER — Encounter: Payer: Self-pay | Admitting: Nurse Practitioner

## 2016-05-28 ENCOUNTER — Encounter (INDEPENDENT_AMBULATORY_CARE_PROVIDER_SITE_OTHER): Payer: Self-pay

## 2016-05-28 ENCOUNTER — Ambulatory Visit (INDEPENDENT_AMBULATORY_CARE_PROVIDER_SITE_OTHER): Payer: Medicare Other | Admitting: Nurse Practitioner

## 2016-05-28 VITALS — BP 130/72 | HR 68 | Ht <= 58 in | Wt 187.8 lb

## 2016-05-28 DIAGNOSIS — I428 Other cardiomyopathies: Secondary | ICD-10-CM

## 2016-05-28 DIAGNOSIS — I429 Cardiomyopathy, unspecified: Secondary | ICD-10-CM | POA: Diagnosis not present

## 2016-05-28 MED ORDER — CARVEDILOL 6.25 MG PO TABS: 6.2500 mg | ORAL_TABLET | Freq: Two times a day (BID) | ORAL | 3 refills | Status: DC

## 2016-05-28 NOTE — Progress Notes (Signed)
CARDIOLOGY OFFICE NOTE  Date:  05/28/2016    Tara [name] Date of Birth: 1946/07/08 Medical Record [id number]  PCP:  [name], MD  Cardiologist:  Allred    Chief Complaint  Patient presents with  . Cardiomyopathy    Follow up visit - seen for Dr. Rayann Heman    History of Present Illness: Tara [name] is a 70 y.o. female who presents today for a one month check. Seen for Dr. Rayann Heman.   She has a history of SVT, prior NICM and HLD. She has had history of breast cancer and has had chemo/radiation/surgery.  Seen back in June by Dr. Rayann Heman - felt to be doing ok. Echo was updated. EF had worsened. I then saw her for discussion. She had DOE. Using too much salt. Started her on low dose ARB. Plan to change her Verapamil over to Coreg going forward.  Myoview was updated to rule out ischemia as an etiology.   Comes in today. Here with her husband. Does not feel bad but not feeling great since starting the Losartan. Wakes up with a "rattle in her chest" - better with turning over. Some wheezing - she has an inhaler. She is wondering if it is the Pepcid she has been placed on or the Losartan that makes her have a little indigestion like feeling. Not dizzy or lightheaded. Still short of breath but with exertion - especially with rushing. No chest pain. No real swelling. Seems to be doing better with salt restriction. Weight is down a pound.   Past Medical History:  Diagnosis Date  . Anxiety   . Breast cancer [facility]) 2008   right - s/p lumpectomy->chemo, radiation  . Depression   . Dysrhythmia    hx SVT  . GERD (gastroesophageal reflux disease)   . H/O: hysterectomy   . Hiatal hernia   . History of cancer chemotherapy   . History of radiation therapy   . Hyperlipidemia   . Nonischemic cardiomyopathy ([place])   . SVT (supraventricular tachycardia) ([place])    short RP SVT documented 5/14    Past Surgical History:  Procedure Laterality Date  . ABDOMINAL HYSTERECTOMY  1994   fibroids,   . BREAST SURGERY Right 2008   lumpectomy, cancer  . CHOLECYSTECTOMY  1999  . COLONOSCOPY  2008   Dr. Oneida Alar: multiple polyps. Path not available at time of visit.   Marland Kitchen COLONOSCOPY WITH PROPOFOL N/A 04/25/2014   Procedure: COLONOSCOPY WITH PROPOFOL;  Surgeon: Danie Binder, MD;  Location: [facility];  Service: Endoscopy;  Laterality: N/A;  Cecum time in 1132     time out 1203 total time 31 minutes  . POLYPECTOMY N/A 04/25/2014   Procedure: POLYPECTOMY;  Surgeon: Danie Binder, MD;  Location: [facility];  Service: Endoscopy;  Laterality: N/A;  Ascending and Decending Colon x3 , Transverse colon x2, rectal  . TOTAL ABDOMINAL HYSTERECTOMY W/ BILATERAL SALPINGOOPHORECTOMY  1994     Medications: Current Outpatient Prescriptions  Medication Sig Dispense Refill  . azelastine (ASTELIN) 0.1 % nasal spray Place 2 sprays into both nostrils 2 (two) times daily. Use in each nostril as directed 30 mL 12  . clonazePAM (KLONOPIN) 0.5 MG tablet TAKE (1) TABLET BY MOUTH AT BEDTIME. 90 tablet 0  . docusate sodium (COLACE) 100 MG capsule Take 100 mg by mouth 2 (two) times daily.    . famotidine (PEPCID) 20 MG tablet One at bedtime    . fexofenadine (ALLEGRA) 180 MG tablet Take 180  mg by mouth daily as needed.     . fluticasone (FLONASE) 50 MCG/ACT nasal spray Place 2 sprays into both nostrils daily. 16 g 2  . losartan (COZAAR) 25 MG tablet Take 1 tablet (25 mg total) by mouth daily. 30 tablet 3  . lovastatin (MEVACOR) 40 MG tablet Take 1 tablet (40 mg total) by mouth at bedtime. 90 tablet 1  . metoCLOPramide (REGLAN) 10 MG tablet TAKE ONE CAPSULE BY MOUTH TWICE DAILY. 180 tablet 1  . pantoprazole (PROTONIX) 40 MG tablet Take 1 tablet (40 mg total) by mouth daily. 90 tablet 1  . sertraline (ZOLOFT) 50 MG tablet Take 1 tablet (50 mg total) by mouth daily. 90 tablet 1  . zolpidem (AMBIEN) 10 MG tablet Take 1 tablet (10 mg total) by mouth at bedtime as needed for sleep. 90 tablet 1  . carvedilol (COREG) 6.25  MG tablet Take 1 tablet (6.25 mg total) by mouth 2 (two) times daily. Start taking 1/2 tablet twice a day - increase to a whole tablet twice a day in 2 weeks. 60 tablet 3   No current facility-administered medications for this visit.     Allergies: Allergies  Allergen Reactions  . Aspirin Other (See Comments)    Reports GI bleed with daily use  . Lisinopril Cough  . Sudafed [Pseudoephedrine Hcl]     Pt reports she had drainage in throat that made her throat hurt.     Social History: The patient  reports that she has never smoked. She has never used smokeless tobacco. She reports that she does not drink alcohol or use drugs.   Family History: The patient's family history includes Cancer in her father; Cancer (age of onset: 24) in her brother; Colon cancer (age of onset: 8) in her father; Hypertension in her sister.   Review of Systems: Please see the history of present illness.   Otherwise, the review of systems is positive for none.   All other systems are reviewed and negative.   Physical Exam: VS:  BP 130/72   Pulse 68   Ht 4\' 9"  (1.448 m)   Wt 187 lb 12.8 oz (85.2 kg)   BMI 40.64 kg/m  .  BMI Body mass index is 40.64 kg/m.  Wt Readings from Last 3 Encounters:  05/28/16 187 lb 12.8 oz (85.2 kg)  05/01/16 188 lb (85.3 kg)  04/23/16 188 lb 1.9 oz (85.3 kg)    General: Pleasant. Quite cheerful. Well developed, well nourished and in no acute distress.   HEENT: Normal.  Neck: Supple, no JVD, carotid bruits, or masses noted.  Cardiac: Regular rate and rhythm. No murmurs, rubs, or gallops. No edema.  Respiratory:  Lungs are clear to auscultation bilaterally with normal work of breathing.  GI: Soft and nontender.  MS: No deformity or atrophy. Gait and ROM intact.  Skin: Warm and dry. Color is normal.  Neuro:  Strength and sensation are intact and no gross focal deficits noted.  Psych: Alert, appropriate and with normal affect.   LABORATORY DATA:  EKG:  EKG is not  ordered today.  Lab Results  Component Value Date   WBC 9.9 04/23/2016   HGB 12.6 04/23/2016   HCT 39.0 04/23/2016   PLT 241 04/23/2016   GLUCOSE 80 04/23/2016   CHOL 205 (H) 10/09/2015   TRIG 135 10/09/2015   HDL 59 10/09/2015   LDLCALC 119 10/09/2015   ALT 17 03/16/2016   AST 24 03/16/2016   NA 139 04/23/2016  K 4.4 04/23/2016   CL 104 04/23/2016   CREATININE 1.13 (H) 04/23/2016   BUN 15 04/23/2016   CO2 25 04/23/2016   TSH 3.956 10/09/2015   HGBA1C 5.8 (H) 10/09/2015    BNP (last 3 results)  Recent Labs  03/16/16 1236 04/23/16 1554  BNP 227.0* 80.7    ProBNP (last 3 results) No results for input(s): PROBNP in the last 8760 hours.   Other Studies Reviewed Today:  Myoview Study Highlights from 04/2016   The left ventricular ejection fraction is moderately decreased (30-44%).  Nuclear stress EF: 34%.  There was no ST segment deviation noted during stress.  Defect 1: There is a small defect of moderate severity present in the apical lateral and apex location.  Findings consistent with prior myocardial infarction or scar.  This is a high risk study due to reduced systolic function. No ischemia was identified.    Echo Study Conclusions from 02/2016  - Left ventricle: The cavity size was mildly dilated. Wall thickness was normal. Systolic function was moderately reduced. The estimated ejection fraction was in the range of 35% to 40%. The study is not technically sufficient to allow evaluation of LV diastolic function. - Aortic valve: Valve area (VTI): 1.99 cm^2. Valve area (Vmax): 2.19 cm^2. Valve area (Vmean): 2.23 cm^2. - Mitral valve: There was mild regurgitation. - Left atrium: The atrium was mildly to moderately dilated. - Pulmonary arteries: PA peak pressure: 49 mm Hg (S).  Assessment/Plan: 1. Worsening LV function - Myoview without ischemia - assuming this is NICM - stopping Verapamil today. Change to Coreg. See back in 4 weeks.  Start 3.125 mg BID for 2 weeks and then increase 6.25 mg BID.   2. Chronic systolic HF - fairly well compensated. Continue with salt restriction.   3. Prior history of SVT - switching to beta blocker today.   4. HTN - BP fair - adding low dose ARB today. Lab on return. She is ACE allergic.   5. Prior history of breast cancer.   Current medicines are reviewed with the patient today.  The patient does not have concerns regarding medicines other than what has been noted above.  The following changes have been made:  See above.  Labs/ tests ordered today include:   No orders of the defined types were placed in this encounter.    Disposition:   FU with me in 4 weeks.   Patient is agreeable to this plan and will call if any problems develop in the interim.   Signed: Burtis Junes, RN, ANP-C 05/28/2016 3:07 PM  [facility] [address] [place], [place]  [address] Phone: [phone] Fax: [phone]

## 2016-05-28 NOTE — Patient Instructions (Signed)
We will be checking the following labs today - BMET   Medication Instructions:    Continue with your current medicines. BUT  I am stopping the Verapamil  I am starting Coreg 6.25 mg - to take just 1/2 tablet twice a day and in 2 weeks if you are feeling ok - increase to a whole tablet twice a day    Testing/Procedures To Be Arranged:  N/A  Follow-Up:   See me in 4 weeks.     Other Special Instructions:   N/A    If you need a refill on your cardiac medications before your next appointment, please call your pharmacy.   Call the [place] office at [phone] if you have any questions, problems or concerns.

## 2016-05-29 LAB — BASIC METABOLIC PANEL
BUN: 11 mg/dL (ref 7–25)
CO2: 22 mmol/L (ref 20–31)
Calcium: 9 mg/dL (ref 8.6–10.4)
Chloride: 104 mmol/L (ref 98–110)
Creat: 1.1 mg/dL — ABNORMAL HIGH (ref 0.60–0.93)
Glucose, Bld: 75 mg/dL (ref 65–99)
Potassium: 3.8 mmol/L (ref 3.5–5.3)
Sodium: 140 mmol/L (ref 135–146)

## 2016-06-13 ENCOUNTER — Encounter: Payer: Self-pay | Admitting: *Deleted

## 2016-06-18 ENCOUNTER — Emergency Department (HOSPITAL_COMMUNITY): Payer: Medicare Other

## 2016-06-18 ENCOUNTER — Inpatient Hospital Stay (HOSPITAL_COMMUNITY)
Admission: EM | Admit: 2016-06-18 | Discharge: 2016-06-26 | DRG: 392 | Disposition: A | Discharge status: Home / Self Care | Payer: Medicare Other | Attending: Internal Medicine | Admitting: Internal Medicine

## 2016-06-18 ENCOUNTER — Encounter (HOSPITAL_COMMUNITY): Payer: Self-pay

## 2016-06-18 DIAGNOSIS — I429 Cardiomyopathy, unspecified: Secondary | ICD-10-CM | POA: Diagnosis not present

## 2016-06-18 DIAGNOSIS — K2990 Gastroduodenitis, unspecified, without bleeding: Secondary | ICD-10-CM | POA: Diagnosis not present

## 2016-06-18 DIAGNOSIS — F419 Anxiety disorder, unspecified: Secondary | ICD-10-CM | POA: Diagnosis not present

## 2016-06-18 DIAGNOSIS — I110 Hypertensive heart disease with heart failure: Secondary | ICD-10-CM | POA: Diagnosis present

## 2016-06-18 DIAGNOSIS — M810 Age-related osteoporosis without current pathological fracture: Secondary | ICD-10-CM | POA: Diagnosis present

## 2016-06-18 DIAGNOSIS — K2980 Duodenitis without bleeding: Secondary | ICD-10-CM | POA: Diagnosis not present

## 2016-06-18 DIAGNOSIS — I5022 Chronic systolic (congestive) heart failure: Secondary | ICD-10-CM | POA: Diagnosis not present

## 2016-06-18 DIAGNOSIS — R1013 Epigastric pain: Secondary | ICD-10-CM | POA: Diagnosis not present

## 2016-06-18 DIAGNOSIS — Z90710 Acquired absence of both cervix and uterus: Secondary | ICD-10-CM

## 2016-06-18 DIAGNOSIS — K2970 Gastritis, unspecified, without bleeding: Secondary | ICD-10-CM | POA: Diagnosis not present

## 2016-06-18 DIAGNOSIS — K56609 Unspecified intestinal obstruction, unspecified as to partial versus complete obstruction: Secondary | ICD-10-CM | POA: Diagnosis not present

## 2016-06-18 DIAGNOSIS — K219 Gastro-esophageal reflux disease without esophagitis: Secondary | ICD-10-CM | POA: Diagnosis present

## 2016-06-18 DIAGNOSIS — Z853 Personal history of malignant neoplasm of breast: Secondary | ICD-10-CM

## 2016-06-18 DIAGNOSIS — I471 Supraventricular tachycardia: Secondary | ICD-10-CM | POA: Diagnosis not present

## 2016-06-18 DIAGNOSIS — R933 Abnormal findings on diagnostic imaging of other parts of digestive tract: Secondary | ICD-10-CM | POA: Diagnosis not present

## 2016-06-18 DIAGNOSIS — Z79899 Other long term (current) drug therapy: Secondary | ICD-10-CM

## 2016-06-18 DIAGNOSIS — E8881 Metabolic syndrome: Secondary | ICD-10-CM | POA: Diagnosis present

## 2016-06-18 DIAGNOSIS — R1110 Vomiting, unspecified: Secondary | ICD-10-CM | POA: Diagnosis not present

## 2016-06-18 DIAGNOSIS — Z886 Allergy status to analgesic agent status: Secondary | ICD-10-CM

## 2016-06-18 DIAGNOSIS — K2900 Acute gastritis without bleeding: Secondary | ICD-10-CM | POA: Diagnosis not present

## 2016-06-18 DIAGNOSIS — Z9049 Acquired absence of other specified parts of digestive tract: Secondary | ICD-10-CM

## 2016-06-18 DIAGNOSIS — K3189 Other diseases of stomach and duodenum: Secondary | ICD-10-CM | POA: Diagnosis not present

## 2016-06-18 DIAGNOSIS — F319 Bipolar disorder, unspecified: Secondary | ICD-10-CM | POA: Diagnosis present

## 2016-06-18 DIAGNOSIS — E876 Hypokalemia: Secondary | ICD-10-CM | POA: Diagnosis present

## 2016-06-18 DIAGNOSIS — Z9221 Personal history of antineoplastic chemotherapy: Secondary | ICD-10-CM

## 2016-06-18 DIAGNOSIS — J189 Pneumonia, unspecified organism: Secondary | ICD-10-CM

## 2016-06-18 DIAGNOSIS — Z888 Allergy status to other drugs, medicaments and biological substances status: Secondary | ICD-10-CM

## 2016-06-18 DIAGNOSIS — R140 Abdominal distension (gaseous): Secondary | ICD-10-CM | POA: Diagnosis not present

## 2016-06-18 DIAGNOSIS — R9389 Abnormal findings on diagnostic imaging of other specified body structures: Secondary | ICD-10-CM

## 2016-06-18 DIAGNOSIS — E785 Hyperlipidemia, unspecified: Secondary | ICD-10-CM | POA: Diagnosis present

## 2016-06-18 DIAGNOSIS — R1115 Cyclical vomiting syndrome unrelated to migraine: Secondary | ICD-10-CM

## 2016-06-18 DIAGNOSIS — Z923 Personal history of irradiation: Secondary | ICD-10-CM

## 2016-06-18 DIAGNOSIS — K449 Diaphragmatic hernia without obstruction or gangrene: Secondary | ICD-10-CM | POA: Diagnosis present

## 2016-06-18 HISTORY — DX: Unspecified systolic (congestive) heart failure: I50.20

## 2016-06-18 LAB — CBC
HCT: 36.2 % (ref 36.0–46.0)
HEMOGLOBIN: 11.9 g/dL — AB (ref 12.0–15.0)
MCH: 26.5 pg (ref 26.0–34.0)
MCHC: 32.9 g/dL (ref 30.0–36.0)
MCV: 80.6 fL (ref 78.0–100.0)
Platelets: 312 10*3/uL (ref 150–400)
RBC: 4.49 MIL/uL (ref 3.87–5.11)
RDW: 15.7 % — ABNORMAL HIGH (ref 11.5–15.5)
WBC: 15.9 10*3/uL — ABNORMAL HIGH (ref 4.0–10.5)

## 2016-06-18 LAB — URINALYSIS, ROUTINE W REFLEX MICROSCOPIC
Glucose, UA: NEGATIVE mg/dL
Hgb urine dipstick: NEGATIVE
Ketones, ur: 15 mg/dL — AB
Leukocytes, UA: NEGATIVE
NITRITE: NEGATIVE
Protein, ur: 100 mg/dL — AB
pH: 6 (ref 5.0–8.0)

## 2016-06-18 LAB — LIPASE, BLOOD: Lipase: 22 U/L (ref 11–51)

## 2016-06-18 LAB — I-STAT TROPONIN, ED: Troponin i, poc: 0.01 ng/mL (ref 0.00–0.08)

## 2016-06-18 LAB — COMPREHENSIVE METABOLIC PANEL
ALK PHOS: 97 U/L (ref 38–126)
ALT: 14 U/L (ref 14–54)
ANION GAP: 10 (ref 5–15)
AST: 22 U/L (ref 15–41)
Albumin: 3.6 g/dL (ref 3.5–5.0)
BILIRUBIN TOTAL: 0.7 mg/dL (ref 0.3–1.2)
BUN: 12 mg/dL (ref 6–20)
CALCIUM: 8.6 mg/dL — AB (ref 8.9–10.3)
CO2: 26 mmol/L (ref 22–32)
Chloride: 104 mmol/L (ref 101–111)
Creatinine, Ser: 1.01 mg/dL — ABNORMAL HIGH (ref 0.44–1.00)
GFR calc non Af Amer: 55 mL/min — ABNORMAL LOW (ref >60)
Glucose, Bld: 94 mg/dL (ref 65–99)
Potassium: 3.4 mmol/L — ABNORMAL LOW (ref 3.5–5.1)
SODIUM: 140 mmol/L (ref 135–145)
TOTAL PROTEIN: 7.5 g/dL (ref 6.5–8.1)

## 2016-06-18 LAB — URINE MICROSCOPIC-ADD ON

## 2016-06-18 LAB — POC OCCULT BLOOD, ED: FECAL OCCULT BLD: NEGATIVE

## 2016-06-18 MED ORDER — FAMOTIDINE IN NACL 20-0.9 MG/50ML-% IV SOLN
20.0000 mg | Freq: Once | INTRAVENOUS | Status: AC
  Administered 2016-06-18: 20 mg via INTRAVENOUS
  Filled 2016-06-18: qty 50

## 2016-06-18 MED ORDER — AZELASTINE HCL 0.1 % NA SOLN
2.0000 | Freq: Two times a day (BID) | NASAL | Status: DC
  Administered 2016-06-20 – 2016-06-26 (×13): 2 via NASAL
  Filled 2016-06-18: qty 30

## 2016-06-18 MED ORDER — HYDROMORPHONE HCL 1 MG/ML IJ SOLN
0.5000 mg | INTRAMUSCULAR | Status: AC | PRN
  Administered 2016-06-18 – 2016-06-19 (×3): 0.5 mg via INTRAVENOUS
  Filled 2016-06-18 (×3): qty 1

## 2016-06-18 MED ORDER — ALBUTEROL SULFATE (2.5 MG/3ML) 0.083% IN NEBU
2.5000 mg | INHALATION_SOLUTION | RESPIRATORY_TRACT | Status: DC | PRN
  Administered 2016-06-20 – 2016-06-23 (×2): 2.5 mg via RESPIRATORY_TRACT
  Filled 2016-06-18 (×2): qty 3

## 2016-06-18 MED ORDER — TRAZODONE HCL 50 MG PO TABS
50.0000 mg | ORAL_TABLET | Freq: Every evening | ORAL | Status: DC | PRN
  Administered 2016-06-20: 50 mg via ORAL
  Filled 2016-06-18 (×3): qty 1

## 2016-06-18 MED ORDER — IOPAMIDOL (ISOVUE-300) INJECTION 61%
INTRAVENOUS | Status: AC
  Filled 2016-06-18: qty 30

## 2016-06-18 MED ORDER — PANTOPRAZOLE SODIUM 40 MG PO TBEC
40.0000 mg | DELAYED_RELEASE_TABLET | Freq: Every day | ORAL | Status: DC
  Administered 2016-06-20 – 2016-06-21 (×2): 40 mg via ORAL
  Filled 2016-06-18 (×2): qty 1

## 2016-06-18 MED ORDER — IOPAMIDOL (ISOVUE-300) INJECTION 61%
100.0000 mL | Freq: Once | INTRAVENOUS | Status: AC | PRN
  Administered 2016-06-18: 100 mL via INTRAVENOUS

## 2016-06-18 MED ORDER — FLUTICASONE PROPIONATE 50 MCG/ACT NA SUSP
2.0000 | Freq: Every day | NASAL | Status: DC
  Administered 2016-06-20 – 2016-06-26 (×6): 2 via NASAL
  Filled 2016-06-18 (×3): qty 16

## 2016-06-18 MED ORDER — CLONAZEPAM 0.5 MG PO TABS
0.5000 mg | ORAL_TABLET | Freq: Three times a day (TID) | ORAL | Status: DC | PRN
  Administered 2016-06-19 – 2016-06-25 (×7): 0.5 mg via ORAL
  Filled 2016-06-18 (×7): qty 1

## 2016-06-18 MED ORDER — DOCUSATE SODIUM 100 MG PO CAPS
100.0000 mg | ORAL_CAPSULE | Freq: Two times a day (BID) | ORAL | Status: DC
  Administered 2016-06-20: 100 mg via ORAL
  Filled 2016-06-18 (×2): qty 1

## 2016-06-18 MED ORDER — ZOLPIDEM TARTRATE 5 MG PO TABS
5.0000 mg | ORAL_TABLET | Freq: Every evening | ORAL | Status: DC | PRN
  Administered 2016-06-19 – 2016-06-25 (×6): 5 mg via ORAL
  Filled 2016-06-18 (×7): qty 1

## 2016-06-18 MED ORDER — POTASSIUM CHLORIDE CRYS ER 20 MEQ PO TBCR: 40.0000 meq | EXTENDED_RELEASE_TABLET | Freq: Once | ORAL | Status: DC

## 2016-06-18 MED ORDER — MORPHINE SULFATE (PF) 2 MG/ML IV SOLN
2.0000 mg | INTRAVENOUS | Status: DC | PRN
  Administered 2016-06-18 – 2016-06-22 (×10): 2 mg via INTRAVENOUS
  Filled 2016-06-18 (×10): qty 1

## 2016-06-18 MED ORDER — SODIUM CHLORIDE 0.9 % IV SOLN: 250.0000 mL | INTRAVENOUS | Status: DC | PRN

## 2016-06-18 MED ORDER — CARVEDILOL 3.125 MG PO TABS
6.2500 mg | ORAL_TABLET | Freq: Two times a day (BID) | ORAL | Status: DC
  Administered 2016-06-20 – 2016-06-26 (×13): 6.25 mg via ORAL
  Filled 2016-06-18 (×13): qty 2

## 2016-06-18 MED ORDER — LORATADINE 10 MG PO TABS
10.0000 mg | ORAL_TABLET | Freq: Every day | ORAL | Status: DC
  Administered 2016-06-20 – 2016-06-22 (×3): 10 mg via ORAL
  Filled 2016-06-18 (×3): qty 1

## 2016-06-18 MED ORDER — POLYETHYLENE GLYCOL 3350 17 G PO PACK: 17.0000 g | PACK | Freq: Every day | ORAL | Status: DC | PRN

## 2016-06-18 MED ORDER — DEXTROSE-NACL 5-0.45 % IV SOLN: INTRAVENOUS | Status: DC

## 2016-06-18 MED ORDER — METOCLOPRAMIDE HCL 10 MG PO TABS
10.0000 mg | ORAL_TABLET | Freq: Three times a day (TID) | ORAL | Status: DC
  Administered 2016-06-19 – 2016-06-21 (×5): 10 mg via ORAL
  Filled 2016-06-18 (×6): qty 1

## 2016-06-18 MED ORDER — PRAVASTATIN SODIUM 40 MG PO TABS
40.0000 mg | ORAL_TABLET | Freq: Every day | ORAL | Status: DC
  Administered 2016-06-20 – 2016-06-22 (×3): 40 mg via ORAL
  Filled 2016-06-18 (×3): qty 1

## 2016-06-18 MED ORDER — ONDANSETRON HCL 4 MG/2ML IJ SOLN: 4.0000 mg | Freq: Four times a day (QID) | INTRAMUSCULAR | Status: DC | PRN

## 2016-06-18 MED ORDER — ACETAMINOPHEN 650 MG RE SUPP: 650.0000 mg | Freq: Four times a day (QID) | RECTAL | Status: DC | PRN

## 2016-06-18 MED ORDER — ONDANSETRON HCL 4 MG/2ML IJ SOLN
INTRAMUSCULAR | Status: AC
  Filled 2016-06-18: qty 2

## 2016-06-18 MED ORDER — ONDANSETRON HCL 4 MG/2ML IJ SOLN
4.0000 mg | Freq: Once | INTRAMUSCULAR | Status: AC
  Administered 2016-06-18: 4 mg via INTRAVENOUS

## 2016-06-18 MED ORDER — SODIUM CHLORIDE 0.9 % IV SOLN
INTRAVENOUS | Status: DC
  Administered 2016-06-18: 12:00:00 via INTRAVENOUS

## 2016-06-18 MED ORDER — SENNA 8.6 MG PO TABS
1.0000 | ORAL_TABLET | Freq: Two times a day (BID) | ORAL | Status: DC
  Administered 2016-06-20 – 2016-06-23 (×8): 8.6 mg via ORAL
  Filled 2016-06-18 (×8): qty 1

## 2016-06-18 MED ORDER — SODIUM CHLORIDE 0.9% FLUSH
3.0000 mL | Freq: Two times a day (BID) | INTRAVENOUS | Status: DC
  Administered 2016-06-18 – 2016-06-19 (×2): 3 mL via INTRAVENOUS

## 2016-06-18 MED ORDER — FAMOTIDINE IN NACL 20-0.9 MG/50ML-% IV SOLN
20.0000 mg | Freq: Two times a day (BID) | INTRAVENOUS | Status: DC
  Administered 2016-06-18 – 2016-06-21 (×6): 20 mg via INTRAVENOUS
  Filled 2016-06-18 (×6): qty 50

## 2016-06-18 MED ORDER — FAMOTIDINE IN NACL 20-0.9 MG/50ML-% IV SOLN: 20.0000 mg | Freq: Two times a day (BID) | INTRAVENOUS | Status: DC

## 2016-06-18 MED ORDER — KCL IN DEXTROSE-NACL 20-5-0.45 MEQ/L-%-% IV SOLN
INTRAVENOUS | Status: DC
  Administered 2016-06-18 – 2016-06-19 (×2): via INTRAVENOUS

## 2016-06-18 MED ORDER — ONDANSETRON HCL 4 MG/2ML IJ SOLN
4.0000 mg | Freq: Once | INTRAMUSCULAR | Status: AC
  Administered 2016-06-18: 4 mg via INTRAVENOUS
  Filled 2016-06-18: qty 2

## 2016-06-18 MED ORDER — ONDANSETRON HCL 4 MG PO TABS: 4.0000 mg | ORAL_TABLET | Freq: Four times a day (QID) | ORAL | Status: DC | PRN

## 2016-06-18 MED ORDER — ONDANSETRON HCL 4 MG/2ML IJ SOLN: 4.0000 mg | INTRAMUSCULAR | Status: DC | PRN

## 2016-06-18 MED ORDER — SODIUM CHLORIDE 0.9% FLUSH
3.0000 mL | INTRAVENOUS | Status: DC | PRN
Start: 2016-06-18 — End: 2016-06-20

## 2016-06-18 MED ORDER — HEPARIN SODIUM (PORCINE) 5000 UNIT/ML IJ SOLN
5000.0000 [IU] | Freq: Three times a day (TID) | INTRAMUSCULAR | Status: DC
  Administered 2016-06-18 – 2016-06-19 (×2): 5000 [IU] via SUBCUTANEOUS
  Filled 2016-06-18 (×2): qty 1

## 2016-06-18 MED ORDER — SERTRALINE HCL 50 MG PO TABS
50.0000 mg | ORAL_TABLET | Freq: Every day | ORAL | Status: DC
  Administered 2016-06-20 – 2016-06-26 (×7): 50 mg via ORAL
  Filled 2016-06-18 (×7): qty 1

## 2016-06-18 MED ORDER — ACETAMINOPHEN 325 MG PO TABS
650.0000 mg | ORAL_TABLET | Freq: Four times a day (QID) | ORAL | Status: DC | PRN
  Administered 2016-06-23 – 2016-06-25 (×3): 650 mg via ORAL
  Filled 2016-06-18 (×3): qty 2

## 2016-06-18 NOTE — ED Notes (Signed)
Pt taken to xray 

## 2016-06-18 NOTE — H&P (Signed)
Patient Demographics:    Tara [name], is a 70 y.o. female  MRN: [id number]   DOB - 10-13-1945  Admit Date - 06/18/2016  Outpatient Primary MD for the patient is Tara Nakayama, MD   Assessment & Plan:    Principal Problem:   Emesis, persistent Active Problems:   GERD   Gastritis and gastroduodenitis    1)Intractable emesis- clinically and radiologically appears to be secondary to gastroduodenitis (see CT abd report), patient with history of GERD and hiatal hernia, get GI consult for possible EGD given CT abdomen findings. IV PPI not available, so treat empirically with IV Pepcid. NG tube to intermittent wall suction, nothing by mouth otherwise except for medications with clamping of the NG tube. There appears to be retained amount of contrast in the stomach suggesting some degree of delayed gastric emptying/possible gastroparesis. Give scheduled Reglan and when necessary Zofran . Leukocytosis noted , no fevers or chills  2)FEN- hypokalemia noted, replace potassium and give IV fluids until emesis improves enough for patient to reliably take oral intake  3)HTN- okay to restart Coreg, hold losartan until patient is euvolemic  With History of - Reviewed by me  Past Medical History:  Diagnosis Date  . Anxiety   . Breast cancer [facility]) 2008   right - s/p lumpectomy->chemo, radiation  . Depression   . Dysrhythmia    hx SVT  . GERD (gastroesophageal reflux disease)   . H/O: hysterectomy   . Hiatal hernia   . History of cancer chemotherapy   . History of radiation therapy   . Hyperlipidemia   . Nonischemic cardiomyopathy ([place])   . SVT (supraventricular tachycardia) ([place])    short RP SVT documented 5/14  . Systolic CHF [facility])       Past Surgical History:  Procedure Laterality Date  . ABDOMINAL  HYSTERECTOMY  1994   fibroids,   . BREAST SURGERY Right 2008   lumpectomy, cancer  . CHOLECYSTECTOMY  1999  . COLONOSCOPY  2008   Dr. Oneida Alar: multiple polyps. Path not available at time of visit.   Marland Kitchen COLONOSCOPY WITH PROPOFOL N/A 04/25/2014   Procedure: COLONOSCOPY WITH PROPOFOL;  Surgeon: Danie Binder, MD;  Location: [facility];  Service: Endoscopy;  Laterality: N/A;  Cecum time in 1132     time out 1203 total time 31 minutes  . POLYPECTOMY N/A 04/25/2014   Procedure: POLYPECTOMY;  Surgeon: Danie Binder, MD;  Location: [facility];  Service: Endoscopy;  Laterality: N/A;  Ascending and Decending Colon x3 , Transverse colon x2, rectal  . TOTAL ABDOMINAL HYSTERECTOMY W/ BILATERAL SALPINGOOPHORECTOMY  1994      Chief Complaint  Patient presents with  . Abdominal Pain      HPI:    Tara [name]  is a 70 y.o. female, With past medical history relevant for hiatal hernia, GERD and status post prior cholecystectomy who presents with a one-week history of nausea and vomiting that got worse over  the last 48 hours. No fever no chills, no urinary symptoms, no flank pain. Patient had one episode of dark stools today. Emesis is without blood or bowel. No diarrhea. No sick contacts at home. No URI symptoms. No chest pains no palpitations no shortness of breath no dizziness. Husband is at bedside questions answered.   In ED- patient had persistent emesis, CT abdomen and pelvis showed retained contrast material in the stomach suggesting delayed gastric emptying as well as significant inflammation of the antrum of the stomach and the duodenal walls suggesting antritis and duodenitis.     Review of systems:    In addition to the HPI above,   A full 12 point Review of 10 Systems was done, except as stated above, all other Review of 10 Systems were negative.    Social History:  Reviewed by me    Social History  Substance Use Topics  . Smoking status: Never Smoker  . Smokeless tobacco: Never Used  .  Alcohol use No       Family History :  Reviewed by me    Family History  Problem Relation Age of Onset  . Colon cancer Father 37    died at age 60  . Cancer Father     colon   . Hypertension Sister   . Cancer Brother 100    prostate     Home Medications:   Prior to Admission medications   Medication Sig Start Date End Date Taking? Authorizing Provider  carvedilol (COREG) 6.25 MG tablet Take 1 tablet (6.25 mg total) by mouth 2 (two) times daily. Start 1/2 tablet twice a day - increase to a whole tablet twice a day in 2 weeks. 05/28/16  Yes Burtis Junes, NP  clonazePAM (KLONOPIN) 0.5 MG tablet TAKE (1) TABLET BY MOUTH AT BEDTIME. 04/19/16  Yes [name], MD  docusate sodium (COLACE) 100 MG capsule Take 100 mg by mouth 2 (two) times daily.   Yes Historical Provider, MD  famotidine (PEPCID) 20 MG tablet One at bedtime Patient taking differently: Take 20 mg by mouth at bedtime. One at bedtime 04/09/16  Yes Tanda Rockers, MD  fluticasone [facility]) 50 MCG/ACT nasal spray Place 2 sprays into both nostrils daily. 11/26/15  Yes [name], MD  losartan (COZAAR) 25 MG tablet Take 1 tablet (25 mg total) by mouth daily. 04/23/16 07/22/16 Yes Burtis Junes, NP  lovastatin (MEVACOR) 40 MG tablet Take 1 tablet (40 mg total) by mouth at bedtime. 02/12/16  Yes [name], MD  metoCLOPramide (REGLAN) 10 MG tablet TAKE ONE CAPSULE BY MOUTH TWICE DAILY. 02/22/16  Yes [name], MD  pantoprazole (PROTONIX) 40 MG tablet Take 1 tablet (40 mg total) by mouth daily. 02/12/16  Yes [name], MD  sertraline (ZOLOFT) 50 MG tablet Take 1 tablet (50 mg total) by mouth daily. 11/12/15  Yes [name], MD  zolpidem (AMBIEN) 10 MG tablet Take 1 tablet (10 mg total) by mouth at bedtime as needed for sleep. 03/03/16  Yes [name], MD  azelastine (ASTELIN) 0.1 % nasal spray Place 2 sprays into both nostrils 2 (two) times daily. Use in each nostril as  directed Patient taking differently: Place 2 sprays into both nostrils 2 (two) times daily as needed for allergies. Use in each nostril as directed 10/09/15   [name], MD  fexofenadine (ALLEGRA) 180 MG tablet Take 180 mg by mouth daily as needed for allergies.  Historical Provider, MD     Allergies:     Allergies  Allergen Reactions  . Aspirin Other (See Comments)    Reports GI bleed with daily use  . Lisinopril Cough  . Sudafed [Pseudoephedrine Hcl]     Pt reports she had drainage in throat that made her throat hurt.      Physical Exam:   Vitals  Blood pressure (!) 148/68, pulse 74, temperature 98.2 F (36.8 C), temperature source Oral, resp. rate 18, height 4\' 11"  (1.499 m), weight 83.9 kg (184 lb 15.5 oz), SpO2 91 %.  Physical Examination: General appearance - alert, well appearing, and in no distress  Mental status - alert, oriented to person, place, and time,  Eyes - sclera anicteric Nose- NG tube Neck - supple, no JVD elevation , Chest - clear  to auscultation bilaterally, symmetrical air movement,  Heart - S1 and S2 normal,  Abdomen - soft, Epigastric discomfort without rebound or guarding,, nondistended, no masses or organomegaly, no CVA area tenderness Neurological - screening mental status exam normal, neck supple without rigidity, cranial nerves II through XII intact, DTR's normal and symmetric Extremities - no pedal edema noted, intact peripheral pulses  Skin - warm, dry    Data Review:    CBC  Recent Labs Lab 06/18/16 1117  WBC 15.9*  HGB 11.9*  HCT 36.2  PLT 312  MCV 80.6  MCH 26.5  MCHC 32.9  RDW 15.7*   ------------------------------------------------------------------------------------------------------------------  Chemistries   Recent Labs Lab 06/18/16 1117  NA 140  K 3.4*  CL 104  CO2 26  GLUCOSE 94  BUN 12  CREATININE 1.01*  CALCIUM 8.6*  AST 22  ALT 14  ALKPHOS 97  BILITOT 0.7    ------------------------------------------------------------------------------------------------------------------ estimated creatinine clearance is 48.7 mL/min (by C-G formula based on SCr of 1.01 mg/dL (H)). ------------------------------------------------------------------------------------------------------------------ No results for input(s): TSH, T4TOTAL, T3FREE, THYROIDAB in the last 72 hours.  Invalid input(s): FREET3   Coagulation profile No results for input(s): INR, PROTIME in the last 168 hours. ------------------------------------------------------------------------------------------------------------------- No results for input(s): DDIMER in the last 72 hours. -------------------------------------------------------------------------------------------------------------------  Cardiac Enzymes No results for input(s): CKMB, TROPONINI, MYOGLOBIN in the last 168 hours.  Invalid input(s): CK ------------------------------------------------------------------------------------------------------------------    Component Value Date/Time   BNP 80.7 04/23/2016 1554     ---------------------------------------------------------------------------------------------------------------  Urinalysis    Component Value Date/Time   COLORURINE AMBER (A) 06/18/2016 1108   APPEARANCEUR CLOUDY (A) 06/18/2016 1108   LABSPEC >1.030 (H) 06/18/2016 1108   PHURINE 6.0 06/18/2016 1108   GLUCOSEU NEGATIVE 06/18/2016 1108   HGBUR NEGATIVE 06/18/2016 1108   BILIRUBINUR LARGE (A) 06/18/2016 1108   KETONESUR 15 (A) 06/18/2016 1108   PROTEINUR 100 (A) 06/18/2016 1108   UROBILINOGEN 0.2 02/07/2013 1859   NITRITE NEGATIVE 06/18/2016 1108   LEUKOCYTESUR NEGATIVE 06/18/2016 1108    ----------------------------------------------------------------------------------------------------------------   Imaging Results:    Ct Abdomen Pelvis W Contrast  Result Date: 06/18/2016 CLINICAL DATA:   Referring epigastric pain for 1 month, vomiting on and off for 2 days. Leukocytosis. EXAM: CT ABDOMEN AND PELVIS WITH CONTRAST TECHNIQUE: Multidetector CT imaging of the abdomen and pelvis was performed using the standard protocol following bolus administration of intravenous contrast. CONTRAST:  181mL ISOVUE-300 IOPAMIDOL (ISOVUE-300) INJECTION 61% COMPARISON:  CT exams from 03/04/2007 FINDINGS: Lower chest: Small right pleural effusion. Small to moderate-sized hiatal hernia. Distal esophageal circumferential thickening up to 9 mm with some enteric contrast seen in the distal esophagus and hiatal hernia possibly from reflux or  incomplete emptying. Left lower lobe and lingular ground-glass opacities possibly representing areas of pneumonitis and cortical thickness. Sequela of CHF and/or hypoventilatory change are also possibilities. Hepatobiliary: Cholecystectomy. Mild intrahepatic ductal dilatation which can be seen in the setting prior cholecystectomy. 6 mm right hepatic hypodensity is smaller than on prior exam, previously having measured 9 mm. Small hemangioma is a possibility. A 3 mm left hepatic hypodensity too small to further characterize the stable since 2008, benign finding. Pancreas: Thin and attenuated along the body and tail. Slightly more bulbous appearance without focal mass at the pancreatic head. There appears to be some mild ectasia of the distal pancreatic duct up to 6 mm at the level of the portal and splenic vein confluence. Spleen: Normal in size without focal abnormality. Adrenals/Urinary Tract: Stable 7 mm cyst in the upper pole the right kidney. No obstructive uropathy or enhancing mass lesions. The ureters are unremarkable. Bladder is nondistended. Stomach/Bowel: There is a large amount of retained oral contrast within the stomach. There is thickening of the gastric antrum. The duodenum is thickened and distended in appearance as with similar appearance of the proximal jejunum. Beyond this,  the caliber of small and large bowel are normal. There is scattered diverticulosis along the descending and sigmoid colon. There is a small amount of free fluid in the right upper quadrant along the left pericolic gutter extending into the pelvis. Vascular/Lymphatic: Small subcentimeter mesenteric lymph nodes. Minimal atherosclerosis of the distal aorta. No aneurysm or dissection. Reproductive: Hysterectomy.  No adnexal mass. Other: Small amount of free fluid along the left pericolic gutter, pelvis and right upper quadrant. Musculoskeletal: No acute osseous abnormality. IMPRESSION: Moderate to markedly thickened gastric antrum with duodenal thickening and distension consistent with the gastric antritis and duodenitis. Given normal appearance of the body and tail of pancreas, pancreatitis believed less likely but can be correlated with pancreatic enzymes. Moderate to marked retention of oral contrast within the stomach. Patient would benefit from NG tube placement to decompress stomach. Left lower lobe ground-glass opacities possibly representing areas of alveolitis peritonitis, sequela of CHF or hypoventilatory change. Hysterectomy. Cholecystectomy. Hepatic and renal hypodensities consistent with cysts. Electronically Signed   By: Ashley Royalty M.D.   On: 06/18/2016 16:35   Dg Abd Acute W/chest  Result Date: 06/18/2016 CLINICAL DATA:  Cough for 3 weeks, vomiting for 2 days EXAM: DG ABDOMEN ACUTE W/ 1V CHEST COMPARISON:  04/09/2016 FINDINGS: Cardiomegaly again noted. No acute infiltrate or pulmonary edema. Surgical clips are noted in right axilla. Calcified right hilar and mediastinal lymph nodes are again noted. There is normal small bowel gas pattern. Postcholecystectomy surgical clips are noted. Surgical clips are noted within pelvis. IMPRESSION: Negative abdominal radiographs.  No acute cardiopulmonary disease. Electronically Signed   By: Lahoma Crocker M.D.   On: 06/18/2016 11:59    Radiological Exams on  Admission: Ct Abdomen Pelvis W Contrast  Result Date: 06/18/2016 CLINICAL DATA:  Referring epigastric pain for 1 month, vomiting on and off for 2 days. Leukocytosis. EXAM: CT ABDOMEN AND PELVIS WITH CONTRAST TECHNIQUE: Multidetector CT imaging of the abdomen and pelvis was performed using the standard protocol following bolus administration of intravenous contrast. CONTRAST:  127mL ISOVUE-300 IOPAMIDOL (ISOVUE-300) INJECTION 61% COMPARISON:  CT exams from 03/04/2007 FINDINGS: Lower chest: Small right pleural effusion. Small to moderate-sized hiatal hernia. Distal esophageal circumferential thickening up to 9 mm with some enteric contrast seen in the distal esophagus and hiatal hernia possibly from reflux or incomplete emptying. Left lower lobe and lingular  ground-glass opacities possibly representing areas of pneumonitis and cortical thickness. Sequela of CHF and/or hypoventilatory change are also possibilities. Hepatobiliary: Cholecystectomy. Mild intrahepatic ductal dilatation which can be seen in the setting prior cholecystectomy. 6 mm right hepatic hypodensity is smaller than on prior exam, previously having measured 9 mm. Small hemangioma is a possibility. A 3 mm left hepatic hypodensity too small to further characterize the stable since 2008, benign finding. Pancreas: Thin and attenuated along the body and tail. Slightly more bulbous appearance without focal mass at the pancreatic head. There appears to be some mild ectasia of the distal pancreatic duct up to 6 mm at the level of the portal and splenic vein confluence. Spleen: Normal in size without focal abnormality. Adrenals/Urinary Tract: Stable 7 mm cyst in the upper pole the right kidney. No obstructive uropathy or enhancing mass lesions. The ureters are unremarkable. Bladder is nondistended. Stomach/Bowel: There is a large amount of retained oral contrast within the stomach. There is thickening of the gastric antrum. The duodenum is thickened and  distended in appearance as with similar appearance of the proximal jejunum. Beyond this, the caliber of small and large bowel are normal. There is scattered diverticulosis along the descending and sigmoid colon. There is a small amount of free fluid in the right upper quadrant along the left pericolic gutter extending into the pelvis. Vascular/Lymphatic: Small subcentimeter mesenteric lymph nodes. Minimal atherosclerosis of the distal aorta. No aneurysm or dissection. Reproductive: Hysterectomy.  No adnexal mass. Other: Small amount of free fluid along the left pericolic gutter, pelvis and right upper quadrant. Musculoskeletal: No acute osseous abnormality. IMPRESSION: Moderate to markedly thickened gastric antrum with duodenal thickening and distension consistent with the gastric antritis and duodenitis. Given normal appearance of the body and tail of pancreas, pancreatitis believed less likely but can be correlated with pancreatic enzymes. Moderate to marked retention of oral contrast within the stomach. Patient would benefit from NG tube placement to decompress stomach. Left lower lobe ground-glass opacities possibly representing areas of alveolitis peritonitis, sequela of CHF or hypoventilatory change. Hysterectomy. Cholecystectomy. Hepatic and renal hypodensities consistent with cysts. Electronically Signed   By: Ashley Royalty M.D.   On: 06/18/2016 16:35   Dg Abd Acute W/chest  Result Date: 06/18/2016 CLINICAL DATA:  Cough for 3 weeks, vomiting for 2 days EXAM: DG ABDOMEN ACUTE W/ 1V CHEST COMPARISON:  04/09/2016 FINDINGS: Cardiomegaly again noted. No acute infiltrate or pulmonary edema. Surgical clips are noted in right axilla. Calcified right hilar and mediastinal lymph nodes are again noted. There is normal small bowel gas pattern. Postcholecystectomy surgical clips are noted. Surgical clips are noted within pelvis. IMPRESSION: Negative abdominal radiographs.  No acute cardiopulmonary disease.  Electronically Signed   By: Lahoma Crocker M.D.   On: 06/18/2016 11:59    DVT Prophylaxis -SCD  /heparin subcutaneous AM Labs Ordered, also please review Full Orders  Family Communication: Admission, patients condition and plan of care including tests being ordered have been discussed with the patient and husband at bedside who indicate understanding and agree with the plan   Code Status - Full Code  Likely DC to  home  Condition   stable  Icarus Partch M.D on 06/18/2016 at 10:47 PM   Between 7am to 7pm - Pager - [phone]  After 7pm go to [email or web address] - password [facility]  [facility] - Office  [phone]  Dragon dictation system was used to create this note, attempts have been made to correct errors, however presence of uncorrected errors is  not a reflection quality of care provided.

## 2016-06-18 NOTE — ED Notes (Signed)
Pt was told by cardiologist that she had low oxygen due to cardiomyopathy and only has35% "of heart working"

## 2016-06-18 NOTE — ED Notes (Signed)
Pt vomited contrast. Nad.

## 2016-06-18 NOTE — ED Triage Notes (Signed)
Patient reports of epigastric pain x1 week. States she ate at [employer] and has been feeling bad since.  On and off vomiting x2 days.

## 2016-06-18 NOTE — ED Notes (Signed)
Pt returned from xray

## 2016-06-18 NOTE — ED Notes (Signed)
Pt states drinking the contrast made her pain worse. Rating 10

## 2016-06-18 NOTE — ED Notes (Signed)
Pt requesting nausea and pain med.

## 2016-06-18 NOTE — ED Provider Notes (Signed)
[facility] DEPT Provider Note   CSN: [id number] Arrival date & time: 06/18/16  1055  By signing my name below, I, Rayna Sexton, attest that this documentation has been prepared under the direction and in the presence of Dorie Rank, MD. Electronically Signed: Rayna Sexton, ED Scribe. 06/18/16. 11:39 AM.   History   Chief Complaint Chief Complaint  Patient presents with  . Abdominal Pain    HPI HPI Comments: SORINA LOWEY is a 70 y.o. female with a h/o hiatal hernia and GERD who presents to the Emergency Department complaining of constant, mild, epigastric pain x 2 days. Pt states she has experienced associated nausea, 3x vomiting, decreased appetite, difficulty sleeping due to pain and 1x dark stool this morning. Her n/v worsens when eating and her pain worsens when coughing. She denies a h/o intestinal blockages and confirms her SHx including cholecystectomy. She denies diarrhea, constipation, increased urinary frequency, dysuria, CP and SOB.   The history is provided by the patient. No language interpreter was used.    Past Medical History:  Diagnosis Date  . Anxiety   . Breast cancer [facility]) 2008   right - s/p lumpectomy->chemo, radiation  . Depression   . Dysrhythmia    hx SVT  . GERD (gastroesophageal reflux disease)   . H/O: hysterectomy   . Hiatal hernia   . History of cancer chemotherapy   . History of radiation therapy   . Hyperlipidemia   . Nonischemic cardiomyopathy ([place])   . SVT (supraventricular tachycardia) ([place])    short RP SVT documented 5/14  . Systolic CHF [facility])     Patient Active Problem List   Diagnosis Date Noted  . Upper airway cough syndrome  vs Cough variant asthma  04/09/2016  . H/O fall 03/23/2016  . Dyspnea 03/23/2016  . Encounter for examination following treatment at hospital 03/23/2016  . Medicare annual wellness visit, subsequent 03/17/2016  . Chronic coughing 03/17/2016  . Poor vision 12/11/2014  . Seasonal allergies 08/08/2014    . Family history of colon cancer 03/30/2014  . Osteoporosis 02/14/2014  . Prediabetes 02/05/2014  . Metabolic syndrome X [id number]  . SVT (supraventricular tachycardia) ([place]) 03/14/2013  . Nonischemic cardiomyopathy ([place]) 03/14/2013  . Vitamin D deficiency 07/23/2010  . Obesity, Class II, BMI 35-39.9, with comorbidity ([place]) 07/04/2009  . Malignant neoplasm of right breast ([place]) 12/13/2007  . Hyperlipidemia 12/13/2007  . ANXIETY, CHRONIC 12/13/2007  . DEPRESSION 12/13/2007  . GERD 12/13/2007    Past Surgical History:  Procedure Laterality Date  . ABDOMINAL HYSTERECTOMY  1994   fibroids,   . BREAST SURGERY Right 2008   lumpectomy, cancer  . CHOLECYSTECTOMY  1999  . COLONOSCOPY  2008   Dr. Oneida Alar: multiple polyps. Path not available at time of visit.   Marland Kitchen COLONOSCOPY WITH PROPOFOL N/A 04/25/2014   Procedure: COLONOSCOPY WITH PROPOFOL;  Surgeon: Danie Binder, MD;  Location: [facility] ORS;  Service: Endoscopy;  Laterality: N/A;  [place] time in 1132     time out 1203 total time 31 minutes  . POLYPECTOMY N/A 04/25/2014   Procedure: POLYPECTOMY;  Surgeon: Danie Binder, MD;  Location: [facility] ORS;  Service: Endoscopy;  Laterality: N/A;  Ascending and Decending Colon x3 , Transverse colon x2, rectal  . TOTAL ABDOMINAL HYSTERECTOMY W/ BILATERAL SALPINGOOPHORECTOMY  1994    OB History    No data available       Home Medications    Prior to Admission medications   Medication Sig Start Date End Date Taking?  Authorizing Provider  carvedilol (COREG) 6.25 MG tablet Take 1 tablet (6.25 mg total) by mouth 2 (two) times daily. Start 1/2 tablet twice a day - increase to a whole tablet twice a day in 2 weeks. 05/28/16  Yes Burtis Junes, NP  clonazePAM (KLONOPIN) 0.5 MG tablet TAKE (1) TABLET BY MOUTH AT BEDTIME. 04/19/16  Yes [name], MD  docusate sodium (COLACE) 100 MG capsule Take 100 mg by mouth 2 (two) times daily.   Yes Historical Provider, MD  famotidine (PEPCID) 20 MG tablet One at  bedtime Patient taking differently: Take 20 mg by mouth at bedtime. One at bedtime 04/09/16  Yes Tanda Rockers, MD  fluticasone [facility]) 50 MCG/ACT nasal spray Place 2 sprays into both nostrils daily. 11/26/15  Yes [name], MD  losartan (COZAAR) 25 MG tablet Take 1 tablet (25 mg total) by mouth daily. 04/23/16 07/22/16 Yes Burtis Junes, NP  lovastatin (MEVACOR) 40 MG tablet Take 1 tablet (40 mg total) by mouth at bedtime. 02/12/16  Yes [name], MD  metoCLOPramide (REGLAN) 10 MG tablet TAKE ONE CAPSULE BY MOUTH TWICE DAILY. 02/22/16  Yes [name], MD  pantoprazole (PROTONIX) 40 MG tablet Take 1 tablet (40 mg total) by mouth daily. 02/12/16  Yes [name], MD  sertraline (ZOLOFT) 50 MG tablet Take 1 tablet (50 mg total) by mouth daily. 11/12/15  Yes [name], MD  zolpidem (AMBIEN) 10 MG tablet Take 1 tablet (10 mg total) by mouth at bedtime as needed for sleep. 03/03/16  Yes [name], MD  azelastine (ASTELIN) 0.1 % nasal spray Place 2 sprays into both nostrils 2 (two) times daily. Use in each nostril as directed Patient taking differently: Place 2 sprays into both nostrils 2 (two) times daily as needed for allergies. Use in each nostril as directed 10/09/15   [name], MD  fexofenadine (ALLEGRA) 180 MG tablet Take 180 mg by mouth daily as needed for allergies.     Historical Provider, MD    Family History Family History  Problem Relation Age of Onset  . Colon cancer Father 50    died at age 87  . Cancer Father     colon   . Hypertension Sister   . Cancer Brother 35    prostate    Social History Social History  Substance Use Topics  . Smoking status: Never Smoker  . Smokeless tobacco: Never Used  . Alcohol use No     Allergies   Aspirin; Lisinopril; and Sudafed [pseudoephedrine hcl]   Review of Systems Review of Systems  Constitutional: Positive for appetite change.  Cardiovascular: Negative for chest pain.    Gastrointestinal: Positive for abdominal pain, nausea and vomiting. Negative for constipation and diarrhea.  Genitourinary: Negative for difficulty urinating, dysuria, frequency and urgency.  Psychiatric/Behavioral: Positive for sleep disturbance.  All other systems reviewed and are negative.  Physical Exam Updated Vital Signs BP 130/67 (BP Location: Left Arm)   Pulse 69   Temp 99.2 F (37.3 C) (Rectal)   Resp 19   Ht 4\' 11"  (1.499 m)   Wt 84.4 kg   SpO2 (!) 89%   BMI 37.57 kg/m   Physical Exam  Constitutional: She appears well-developed and well-nourished. No distress.  HENT:  Head: Normocephalic and atraumatic.  Right Ear: External ear normal.  Left Ear: External ear normal.  Eyes: Conjunctivae are normal. Right eye exhibits no discharge. Left eye exhibits no discharge. No scleral  icterus.  Neck: Neck supple. No tracheal deviation present.  Cardiovascular: Normal rate, regular rhythm and intact distal pulses.   Pulmonary/Chest: Effort normal and breath sounds normal. No stridor. No respiratory distress. She has no wheezes. She has no rales.  Abdominal: Soft. Bowel sounds are normal. She exhibits no distension and no mass. There is tenderness in the epigastric area. There is no rebound and no guarding.  Musculoskeletal: She exhibits no edema or tenderness.  Neurological: She is alert. She has normal strength. No cranial nerve deficit (no facial droop, extraocular movements intact, no slurred speech) or sensory deficit. She exhibits normal muscle tone. She displays no seizure activity. Coordination normal.  Skin: Skin is warm and dry. No rash noted.  Psychiatric: She has a normal mood and affect.  Nursing note and vitals reviewed.  ED Treatments / Results  Labs (all labs ordered are listed, but only abnormal results are displayed) Labs Reviewed  COMPREHENSIVE METABOLIC PANEL - Abnormal; Notable for the following:       Result Value   Potassium 3.4 (*)    Creatinine, Ser  1.01 (*)    Calcium 8.6 (*)    GFR calc non Af Amer 55 (*)    All other components within normal limits  CBC - Abnormal; Notable for the following:    WBC 15.9 (*)    Hemoglobin 11.9 (*)    RDW 15.7 (*)    All other components within normal limits  URINALYSIS, ROUTINE W REFLEX MICROSCOPIC (NOT AT [facility]) - Abnormal; Notable for the following:    Color, Urine AMBER (*)    APPearance CLOUDY (*)    Specific Gravity, Urine >1.030 (*)    Bilirubin Urine LARGE (*)    Ketones, ur 15 (*)    Protein, ur 100 (*)    All other components within normal limits  URINE MICROSCOPIC-ADD ON - Abnormal; Notable for the following:    Squamous Epithelial / LPF 6-30 (*)    Bacteria, UA MANY (*)    All other components within normal limits  LIPASE, BLOOD  POC OCCULT BLOOD, ED  I-STAT TROPOININ, ED    EKG  EKG Interpretation  Date/Time:  Wednesday June 18 2016 11:03:36 EDT Ventricular Rate:  76 PR Interval:  144 QRS Duration: 94 QT Interval:  396 QTC Calculation: 445 R Axis:   -31 Text Interpretation:  Normal sinus rhythm Left axis deviation Minimal voltage criteria for LVH, may be normal variant Nonspecific ST abnormality Abnormal ECG No significant change since last tracing Confirmed by Dula Havlik  MD-J, Misha Antonini [id number]) on 06/18/2016 11:17:03 AM       Radiology Ct Abdomen Pelvis W Contrast  Result Date: 06/18/2016 CLINICAL DATA:  Referring epigastric pain for 1 month, vomiting on and off for 2 days. Leukocytosis. EXAM: CT ABDOMEN AND PELVIS WITH CONTRAST TECHNIQUE: Multidetector CT imaging of the abdomen and pelvis was performed using the standard protocol following bolus administration of intravenous contrast. CONTRAST:  1106mL ISOVUE-300 IOPAMIDOL (ISOVUE-300) INJECTION 61% COMPARISON:  CT exams from 03/04/2007 FINDINGS: Lower chest: Small right pleural effusion. Small to moderate-sized hiatal hernia. Distal esophageal circumferential thickening up to 9 mm with some enteric contrast seen in the distal  esophagus and hiatal hernia possibly from reflux or incomplete emptying. Left lower lobe and lingular ground-glass opacities possibly representing areas of pneumonitis and cortical thickness. Sequela of CHF and/or hypoventilatory change are also possibilities. Hepatobiliary: Cholecystectomy. Mild intrahepatic ductal dilatation which can be seen in the setting prior cholecystectomy. 6 mm right hepatic hypodensity  is smaller than on prior exam, previously having measured 9 mm. Small hemangioma is a possibility. A 3 mm left hepatic hypodensity too small to further characterize the stable since 2008, benign finding. Pancreas: Thin and attenuated along the body and tail. Slightly more bulbous appearance without focal mass at the pancreatic head. There appears to be some mild ectasia of the distal pancreatic duct up to 6 mm at the level of the portal and splenic vein confluence. Spleen: Normal in size without focal abnormality. Adrenals/Urinary Tract: Stable 7 mm cyst in the upper pole the right kidney. No obstructive uropathy or enhancing mass lesions. The ureters are unremarkable. Bladder is nondistended. Stomach/Bowel: There is a large amount of retained oral contrast within the stomach. There is thickening of the gastric antrum. The duodenum is thickened and distended in appearance as with similar appearance of the proximal jejunum. Beyond this, the caliber of small and large bowel are normal. There is scattered diverticulosis along the descending and sigmoid colon. There is a small amount of free fluid in the right upper quadrant along the left pericolic gutter extending into the pelvis. Vascular/Lymphatic: Small subcentimeter mesenteric lymph nodes. Minimal atherosclerosis of the distal aorta. No aneurysm or dissection. Reproductive: Hysterectomy.  No adnexal mass. Other: Small amount of free fluid along the left pericolic gutter, pelvis and right upper quadrant. Musculoskeletal: No acute osseous abnormality.  IMPRESSION: Moderate to markedly thickened gastric antrum with duodenal thickening and distension consistent with the gastric antritis and duodenitis. Given normal appearance of the body and tail of pancreas, pancreatitis believed less likely but can be correlated with pancreatic enzymes. Moderate to marked retention of oral contrast within the stomach. Patient would benefit from NG tube placement to decompress stomach. Left lower lobe ground-glass opacities possibly representing areas of alveolitis peritonitis, sequela of CHF or hypoventilatory change. Hysterectomy. Cholecystectomy. Hepatic and renal hypodensities consistent with cysts. Electronically Signed   By: Ashley Royalty M.D.   On: 06/18/2016 16:35   Dg Abd Acute W/chest  Result Date: 06/18/2016 CLINICAL DATA:  Cough for 3 weeks, vomiting for 2 days EXAM: DG ABDOMEN ACUTE W/ 1V CHEST COMPARISON:  04/09/2016 FINDINGS: Cardiomegaly again noted. No acute infiltrate or pulmonary edema. Surgical clips are noted in right axilla. Calcified right hilar and mediastinal lymph nodes are again noted. There is normal small bowel gas pattern. Postcholecystectomy surgical clips are noted. Surgical clips are noted within pelvis. IMPRESSION: Negative abdominal radiographs.  No acute cardiopulmonary disease. Electronically Signed   By: Lahoma Crocker M.D.   On: 06/18/2016 11:59    Procedures Procedures  DIAGNOSTIC STUDIES: Oxygen Saturation is 95% on RA, adequate by my interpretation.    COORDINATION OF CARE: 11:36 AM Discussed next steps with pt. Pt verbalized understanding and is agreeable with the plan.    Medications Ordered in ED Medications  HYDROmorphone (DILAUDID) injection 0.5 mg (0.5 mg Intravenous Given 06/18/16 1404)  0.9 %  sodium chloride infusion ( Intravenous New Bag/Given 06/18/16 1205)  iopamidol (ISOVUE-300) 61 % injection (not administered)  ondansetron (ZOFRAN) injection 4 mg (4 mg Intravenous Given 06/18/16 1205)  famotidine (PEPCID) IVPB 20  mg premix (0 mg Intravenous Stopped 06/18/16 1231)  iopamidol (ISOVUE-300) 61 % injection 100 mL (100 mLs Intravenous Contrast Given 06/18/16 1545)     Initial Impression / Assessment and Plan / ED Course  I have reviewed the triage vital signs and the nursing notes.  Pertinent labs & imaging results that were available during my care of the patient were reviewed by  me and considered in my medical decision making (see chart for details).  Clinical Course  Comment By Time  Labs reveiwed.  Pt does have an elevated WBC count.  Will CT abdomen pelvis to evaluate further Dorie Rank, MD 09/27 1248    CT scan shows gastric and duodenal inflammation. While in the ED her abdomen has become more distended after her drinking the oral contrast.  She also did vomit again.    Will place an NG tube,  Pt was given H2 blocker inhibitor (shortage of PPI). Consult with hospitalist for admission.  Consult GI  I personally performed the services described in this documentation, which was scribed in my presence.  The recorded information has been reviewed and is accurate.  Final Clinical Impressions(s) / ED Diagnoses   Final diagnoses:  Gastritis  Gastric distention    New Prescriptions New Prescriptions   No medications on file     Dorie Rank, MD 06/18/16 1653

## 2016-06-19 ENCOUNTER — Encounter (HOSPITAL_COMMUNITY)
Admission: EM | Disposition: A | Discharge status: Home / Self Care | Payer: Self-pay | Source: Home / Self Care | Attending: Family Medicine

## 2016-06-19 ENCOUNTER — Encounter (HOSPITAL_COMMUNITY): Payer: Self-pay | Admitting: Gastroenterology

## 2016-06-19 ENCOUNTER — Ambulatory Visit: Payer: Medicare Other | Admitting: Internal Medicine

## 2016-06-19 ENCOUNTER — Observation Stay (HOSPITAL_COMMUNITY): Payer: Medicare Other | Admitting: Anesthesiology

## 2016-06-19 DIAGNOSIS — K5660 Unspecified intestinal obstruction: Secondary | ICD-10-CM | POA: Diagnosis not present

## 2016-06-19 DIAGNOSIS — Z9049 Acquired absence of other specified parts of digestive tract: Secondary | ICD-10-CM | POA: Diagnosis not present

## 2016-06-19 DIAGNOSIS — Z886 Allergy status to analgesic agent status: Secondary | ICD-10-CM | POA: Diagnosis not present

## 2016-06-19 DIAGNOSIS — I110 Hypertensive heart disease with heart failure: Secondary | ICD-10-CM | POA: Diagnosis not present

## 2016-06-19 DIAGNOSIS — K2990 Gastroduodenitis, unspecified, without bleeding: Secondary | ICD-10-CM | POA: Diagnosis not present

## 2016-06-19 DIAGNOSIS — Z888 Allergy status to other drugs, medicaments and biological substances status: Secondary | ICD-10-CM | POA: Diagnosis not present

## 2016-06-19 DIAGNOSIS — M810 Age-related osteoporosis without current pathological fracture: Secondary | ICD-10-CM | POA: Diagnosis not present

## 2016-06-19 DIAGNOSIS — K3189 Other diseases of stomach and duodenum: Secondary | ICD-10-CM | POA: Diagnosis not present

## 2016-06-19 DIAGNOSIS — F419 Anxiety disorder, unspecified: Secondary | ICD-10-CM | POA: Diagnosis not present

## 2016-06-19 DIAGNOSIS — R933 Abnormal findings on diagnostic imaging of other parts of digestive tract: Secondary | ICD-10-CM | POA: Diagnosis not present

## 2016-06-19 DIAGNOSIS — E876 Hypokalemia: Secondary | ICD-10-CM | POA: Diagnosis not present

## 2016-06-19 DIAGNOSIS — Z79899 Other long term (current) drug therapy: Secondary | ICD-10-CM | POA: Diagnosis not present

## 2016-06-19 DIAGNOSIS — I429 Cardiomyopathy, unspecified: Secondary | ICD-10-CM | POA: Diagnosis not present

## 2016-06-19 DIAGNOSIS — R05 Cough: Secondary | ICD-10-CM | POA: Diagnosis not present

## 2016-06-19 DIAGNOSIS — K2980 Duodenitis without bleeding: Secondary | ICD-10-CM | POA: Diagnosis not present

## 2016-06-19 DIAGNOSIS — R9389 Abnormal findings on diagnostic imaging of other specified body structures: Secondary | ICD-10-CM

## 2016-06-19 DIAGNOSIS — K319 Disease of stomach and duodenum, unspecified: Secondary | ICD-10-CM | POA: Diagnosis not present

## 2016-06-19 DIAGNOSIS — Z923 Personal history of irradiation: Secondary | ICD-10-CM | POA: Diagnosis not present

## 2016-06-19 DIAGNOSIS — Z9221 Personal history of antineoplastic chemotherapy: Secondary | ICD-10-CM | POA: Diagnosis not present

## 2016-06-19 DIAGNOSIS — K449 Diaphragmatic hernia without obstruction or gangrene: Secondary | ICD-10-CM | POA: Diagnosis present

## 2016-06-19 DIAGNOSIS — F319 Bipolar disorder, unspecified: Secondary | ICD-10-CM | POA: Diagnosis present

## 2016-06-19 DIAGNOSIS — K2970 Gastritis, unspecified, without bleeding: Secondary | ICD-10-CM | POA: Diagnosis not present

## 2016-06-19 DIAGNOSIS — R1110 Vomiting, unspecified: Secondary | ICD-10-CM | POA: Diagnosis not present

## 2016-06-19 DIAGNOSIS — K56609 Unspecified intestinal obstruction, unspecified as to partial versus complete obstruction: Secondary | ICD-10-CM | POA: Diagnosis present

## 2016-06-19 DIAGNOSIS — E8881 Metabolic syndrome: Secondary | ICD-10-CM | POA: Diagnosis not present

## 2016-06-19 DIAGNOSIS — K2900 Acute gastritis without bleeding: Secondary | ICD-10-CM | POA: Diagnosis not present

## 2016-06-19 DIAGNOSIS — Z853 Personal history of malignant neoplasm of breast: Secondary | ICD-10-CM | POA: Diagnosis not present

## 2016-06-19 DIAGNOSIS — K219 Gastro-esophageal reflux disease without esophagitis: Secondary | ICD-10-CM | POA: Diagnosis not present

## 2016-06-19 DIAGNOSIS — R112 Nausea with vomiting, unspecified: Secondary | ICD-10-CM | POA: Diagnosis not present

## 2016-06-19 DIAGNOSIS — E785 Hyperlipidemia, unspecified: Secondary | ICD-10-CM | POA: Diagnosis not present

## 2016-06-19 DIAGNOSIS — I471 Supraventricular tachycardia: Secondary | ICD-10-CM | POA: Diagnosis not present

## 2016-06-19 DIAGNOSIS — I5022 Chronic systolic (congestive) heart failure: Secondary | ICD-10-CM | POA: Diagnosis not present

## 2016-06-19 DIAGNOSIS — K3184 Gastroparesis: Secondary | ICD-10-CM | POA: Diagnosis not present

## 2016-06-19 DIAGNOSIS — Z90710 Acquired absence of both cervix and uterus: Secondary | ICD-10-CM | POA: Diagnosis not present

## 2016-06-19 HISTORY — PX: BIOPSY: SHX5522

## 2016-06-19 HISTORY — PX: ESOPHAGOGASTRODUODENOSCOPY (EGD) WITH PROPOFOL: SHX5813

## 2016-06-19 LAB — CBC
HEMATOCRIT: 35.6 % — AB (ref 36.0–46.0)
Hemoglobin: 11.5 g/dL — ABNORMAL LOW (ref 12.0–15.0)
MCH: 26.3 pg (ref 26.0–34.0)
MCHC: 32.3 g/dL (ref 30.0–36.0)
MCV: 81.3 fL (ref 78.0–100.0)
Platelets: 312 10*3/uL (ref 150–400)
RBC: 4.38 MIL/uL (ref 3.87–5.11)
RDW: 16 % — AB (ref 11.5–15.5)
WBC: 16.1 10*3/uL — ABNORMAL HIGH (ref 4.0–10.5)

## 2016-06-19 LAB — BASIC METABOLIC PANEL
Anion gap: 10 (ref 5–15)
BUN: 9 mg/dL (ref 6–20)
CHLORIDE: 104 mmol/L (ref 101–111)
CO2: 24 mmol/L (ref 22–32)
CREATININE: 0.82 mg/dL (ref 0.44–1.00)
Calcium: 7.9 mg/dL — ABNORMAL LOW (ref 8.9–10.3)
GFR calc Af Amer: >60 mL/min (ref >60)
GFR calc non Af Amer: >60 mL/min (ref >60)
GLUCOSE: 93 mg/dL (ref 65–99)
POTASSIUM: 3.2 mmol/L — AB (ref 3.5–5.1)
Sodium: 138 mmol/L (ref 135–145)

## 2016-06-19 LAB — GLUCOSE, CAPILLARY: Glucose-Capillary: 89 mg/dL (ref 65–99)

## 2016-06-19 SURGERY — ESOPHAGOGASTRODUODENOSCOPY (EGD) WITH PROPOFOL
Anesthesia: Monitor Anesthesia Care

## 2016-06-19 MED ORDER — LIDOCAINE HCL (PF) 1 % IJ SOLN
INTRAMUSCULAR | Status: AC
  Filled 2016-06-19: qty 5

## 2016-06-19 MED ORDER — PROPOFOL 500 MG/50ML IV EMUL
INTRAVENOUS | Status: DC | PRN
  Administered 2016-06-19: 75 ug/kg/min via INTRAVENOUS

## 2016-06-19 MED ORDER — MIDAZOLAM HCL 2 MG/2ML IJ SOLN
INTRAMUSCULAR | Status: AC
  Filled 2016-06-19: qty 2

## 2016-06-19 MED ORDER — ONDANSETRON HCL 4 MG/2ML IJ SOLN
INTRAMUSCULAR | Status: AC
  Filled 2016-06-19: qty 4

## 2016-06-19 MED ORDER — ONDANSETRON HCL 4 MG/2ML IJ SOLN
INTRAMUSCULAR | Status: DC | PRN
  Administered 2016-06-19: 4 mg via INTRAVENOUS

## 2016-06-19 MED ORDER — MIDAZOLAM HCL 5 MG/5ML IJ SOLN
INTRAMUSCULAR | Status: DC | PRN
Start: 2016-06-19 — End: 2016-06-19
  Administered 2016-06-19 (×2): 1 mg via INTRAVENOUS

## 2016-06-19 MED ORDER — HYDROMORPHONE HCL 1 MG/ML IJ SOLN: 0.2500 mg | INTRAMUSCULAR | Status: DC | PRN

## 2016-06-19 MED ORDER — PHENOL 1.4 % MT LIQD
1.0000 | OROMUCOSAL | Status: DC | PRN
  Filled 2016-06-19: qty 177

## 2016-06-19 MED ORDER — LACTATED RINGERS IV SOLN
INTRAVENOUS | Status: DC
  Administered 2016-06-19: 14:00:00 via INTRAVENOUS

## 2016-06-19 MED ORDER — MIDAZOLAM HCL 2 MG/2ML IJ SOLN
1.0000 mg | INTRAMUSCULAR | Status: DC | PRN
  Administered 2016-06-19: 2 mg via INTRAVENOUS
  Administered 2016-06-19: 1 mg via INTRAVENOUS

## 2016-06-19 MED ORDER — SODIUM CHLORIDE 0.9 % IV SOLN: INTRAVENOUS | Status: DC

## 2016-06-19 MED ORDER — LIDOCAINE VISCOUS 2 % MT SOLN
OROMUCOSAL | Status: AC
  Filled 2016-06-19: qty 15

## 2016-06-19 MED ORDER — PROPOFOL 10 MG/ML IV BOLUS
INTRAVENOUS | Status: AC
  Filled 2016-06-19: qty 40

## 2016-06-19 NOTE — Addendum Note (Signed)
Addendum  created 06/19/16 1541 by Mickel Baas, CRNA   Anesthesia Event edited

## 2016-06-19 NOTE — Op Note (Signed)
[facility] Patient Name: Tara [name] Procedure Date: 06/19/2016 2:08 PM MRN: [id number] Date of Birth: 1946/01/13 Attending MD: Norvel Richards , MD CSN: [id number] Age: 70 Admit Type: Inpatient Procedure:                Upper GI endoscopy Indications:              Abnormal CT of the GI tract Providers:                Norvel Richards, MD, Otis Peak B. Sharon Seller, RN,                            Janeece Riggers, RN Referring MD:              Medicines:                Propofol per Anesthesia, Midazolam mg IV Complications:            No immediate complications. Estimated Blood Loss:     Estimated blood loss was minimal. Procedure:                Pre-Anesthesia Assessment:                           - Prior to the procedure, a History and Physical                            was performed, and patient medications and                            allergies were reviewed. The patient's tolerance of                            previous anesthesia was also reviewed. The risks                            and benefits of the procedure and the sedation                            options and risks were discussed with the patient.                            All questions were answered, and informed consent                            was obtained. Prior Anticoagulants: The patient has                            taken no previous anticoagulant or antiplatelet                            agents. ASA Grade Assessment: III - A patient with                            severe systemic disease. After reviewing the risks  and benefits, the patient was deemed in                            satisfactory condition to undergo the procedure.                           After obtaining informed consent, the endoscope was                            passed under direct vision. Throughout the                            procedure, the patient's blood pressure, pulse, and        oxygen saturations were monitored continuously. The                            [id number]) scope was introduced through the                            mouth, and advanced to the second part of duodenum.                            The upper GI endoscopy was accomplished without                            difficulty. The patient tolerated the procedure                            well. Scope In: 2:33:30 PM Scope Out: 2:50:42 PM Total Procedure Duration: 0 hours 17 minutes 12 seconds  Findings:      The distal third of the tubular esophagus appeared somewhat edematous;       overlying mucosa appeared normal. The esophagus remained patent       throughout its course. Stomach: Boggy edematous gastric mucosa       diffusely; would not insufflate very well. Some NG tube trauma on the       greater curvature. NG tube removed during the procedure. Somewhat fixed,       narrowed pyloric channel. Would only admit the scope after moderate       pressure applied. The edematous mucosa extended into the portion. Again,       the mucosa otherwise appeared normal without any obvious Infiltrating       process. multiple biopsies of the stomach and duodenum taken for       histologic study. Impression:               - Abnormal distal esophagus, stomach and duodenum.                            Mucosa in these areas appear markedly edematous.                            Query angioedema vs submucosal process such as  lymphoma. Picture not consistent with ischemia. Moderate Sedation:      Moderate (conscious) sedation was personally administered by an       anesthesia professional. The following parameters were monitored: oxygen       saturation, heart rate, blood pressure, respiratory rate, EKG, adequacy       of pulmonary ventilation, and response to care. Total physician       intraservice time was 25 minutes. Recommendation:           - Return patient to hospital ward  for ongoing care.                            Replace NG tube in PACU. Needs intermittent low                            wall suction. Continue IV H2 therapy.                           - Make the patient NPO starting today.                           - Continue present medications. Procedure Code(s):        --- Professional ---                           [phone], Esophagogastroduodenoscopy, flexible,                            transoral; diagnostic, including collection of                            specimen(s) by brushing or washing, when performed                            (separate procedure) Diagnosis Code(s):        --- Professional ---                           R93.3, Abnormal findings on diagnostic imaging of                            other parts of digestive tract CPT copyright 2016 [employer]. All rights reserved. The codes documented in this report are preliminary and upon coder review may  be revised to meet current compliance requirements. Cristopher Estimable. Rourk, MD Norvel Richards, MD 06/19/2016 3:51:47 PM This report has been signed electronically. Number of Addenda: 0

## 2016-06-19 NOTE — Consult Note (Signed)
Referring Provider: Nita Sells, MD Primary Care Physician:  [name], MD Primary Gastroenterologist:  Barney Drain, MD  Reason for Consultation:  Needs EGD, abnormal CT  HPI: Tara [name] is a 70 y.o. female presenting with two week h/o intermittent nausea, inability to eat. She noted initially that she could not tolerate solid foods so she began eating soft foods. Started having poor appetite and lot of nausea. Over the past 72 hours however, she's developed vomiting without hematemesis. Complains of vomiting bile. Has had a lot of indigestion/heartburn with this illness. No dysphagia/odynophagia. She's had some solid to loose stools. Notes stools dark green to black but this was after taking Pepto-Bismol. Denies fresh blood per rectum. Has had some abdominal discomfort especially upper abdomen.   In the emergency department she was noted to have leukocytosis, WBC 15.9. Stool was Hemoccult negative. LFTs, lipase unremarkable. CT A/P moderate to markedly thickened gastric antrum and duodenal thickening and distention c/w gastric antritis and duodenitis. Moderate to marked retention of oral contrast in stomach. Distal esophageal circumferntial thickening up to 61mm with contrast present. Left lower lobe ground-glass opacities possibly representing areas of alveolitis peritonitis, sequela of CHF or hypoventilatory change.    She takes ibuprofen a couple times per week. No other NSAIDs or aspirin use. In the remote she states she had an upper endoscopy. She states she was told she had a slow digestive system and was placed on Reglan at that time, 4 times daily. Eventually was reduced to 2 times daily because of "fatigue". Typically does not have reflux symptoms. She is on chronic pantoprazole and Pepcid.   Prior to Admission medications   Medication Sig Start Date End Date Taking? Authorizing Provider  carvedilol (COREG) 6.25 MG tablet Take 1 tablet (6.25 mg total) by mouth 2 (two)  times daily. Start 1/2 tablet twice a day - increase to a whole tablet twice a day in 2 weeks. 05/28/16  Yes Burtis Junes, NP  clonazePAM (KLONOPIN) 0.5 MG tablet TAKE (1) TABLET BY MOUTH AT BEDTIME. 04/19/16  Yes [name], MD  docusate sodium (COLACE) 100 MG capsule Take 100 mg by mouth 2 (two) times daily.   Yes Historical Provider, MD  famotidine (PEPCID) 20 MG tablet One at bedtime Patient taking differently: Take 20 mg by mouth at bedtime. One at bedtime 04/09/16  Yes Tanda Rockers, MD  fluticasone [facility]) 50 MCG/ACT nasal spray Place 2 sprays into both nostrils daily. 11/26/15  Yes [name], MD  losartan (COZAAR) 25 MG tablet Take 1 tablet (25 mg total) by mouth daily. 04/23/16 07/22/16 Yes Burtis Junes, NP  lovastatin (MEVACOR) 40 MG tablet Take 1 tablet (40 mg total) by mouth at bedtime. 02/12/16  Yes [name], MD  metoCLOPramide (REGLAN) 10 MG tablet TAKE ONE CAPSULE BY MOUTH TWICE DAILY. 02/22/16  Yes [name], MD  pantoprazole (PROTONIX) 40 MG tablet Take 1 tablet (40 mg total) by mouth daily. 02/12/16  Yes [name], MD  sertraline (ZOLOFT) 50 MG tablet Take 1 tablet (50 mg total) by mouth daily. 11/12/15  Yes [name], MD  zolpidem (AMBIEN) 10 MG tablet Take 1 tablet (10 mg total) by mouth at bedtime as needed for sleep. 03/03/16  Yes [name], MD  azelastine (ASTELIN) 0.1 % nasal spray Place 2 sprays into both nostrils 2 (two) times daily. Use in each nostril as directed Patient taking differently: Place 2 sprays into both nostrils 2 (two) times  daily as needed for allergies. Use in each nostril as directed 10/09/15   [name], MD  fexofenadine (ALLEGRA) 180 MG tablet Take 180 mg by mouth daily as needed for allergies.     Historical Provider, MD    Current Facility-Administered Medications  Medication Dose Route Frequency Provider Last Rate Last Dose  . 0.9 %  sodium chloride infusion   Intravenous Continuous  Dorie Rank, MD 125 mL/hr at 06/18/16 1205    . 0.9 %  sodium chloride infusion  250 mL Intravenous PRN Roxan Hockey, MD      . acetaminophen (TYLENOL) tablet 650 mg  650 mg Oral Q6H PRN Roxan Hockey, MD       Or  . acetaminophen (TYLENOL) suppository 650 mg  650 mg Rectal Q6H PRN Courage Emokpae, MD      . albuterol (PROVENTIL) (2.5 MG/3ML) 0.083% nebulizer solution 2.5 mg  2.5 mg Nebulization Q2H PRN Courage Emokpae, MD      . azelastine (ASTELIN) 0.1 % nasal spray 2 spray  2 spray Each Nare BID Courage Emokpae, MD      . carvedilol (COREG) tablet 6.25 mg  6.25 mg Oral BID Roxan Hockey, MD      . clonazePAM (KLONOPIN) tablet 0.5 mg  0.5 mg Oral TID PRN Roxan Hockey, MD      . dextrose 5 % and 0.45 % NaCl with KCl 20 mEq/L infusion   Intravenous Continuous Roxan Hockey, MD 125 mL/hr at 06/18/16 2300    . docusate sodium (COLACE) capsule 100 mg  100 mg Oral BID Roxan Hockey, MD      . famotidine (PEPCID) IVPB 20 mg premix  20 mg Intravenous Q12H Roxan Hockey, MD   20 mg at 06/18/16 2304  . fluticasone (FLONASE) 50 MCG/ACT nasal spray 2 spray  2 spray Each Nare Daily Courage Emokpae, MD      . heparin injection 5,000 Units  5,000 Units Subcutaneous Q8H Roxan Hockey, MD   5,000 Units at 06/19/16 [phone]  . HYDROmorphone (DILAUDID) injection 0.5 mg  0.5 mg Intravenous Q30 min PRN Dorie Rank, MD   0.5 mg at 06/18/16 1809  . loratadine (CLARITIN) tablet 10 mg  10 mg Oral Daily Courage Emokpae, MD      . metoCLOPramide (REGLAN) tablet 10 mg  10 mg Oral TID Roxan Hockey, MD      . morphine 2 MG/ML injection 2 mg  2 mg Intravenous Q3H PRN Roxan Hockey, MD   2 mg at 06/18/16 2304  . ondansetron (ZOFRAN) tablet 4 mg  4 mg Oral Q6H PRN Roxan Hockey, MD       Or  . ondansetron (ZOFRAN) injection 4 mg  4 mg Intravenous Q6H PRN Courage Emokpae, MD      . ondansetron (ZOFRAN) injection 4 mg  4 mg Intravenous Q4H PRN Courage Emokpae, MD      . pantoprazole (PROTONIX) EC tablet 40 mg   40 mg Oral Daily Courage Emokpae, MD      . polyethylene glycol (MIRALAX / GLYCOLAX) packet 17 g  17 g Oral Daily PRN Courage Emokpae, MD      . potassium chloride SA (K-DUR,KLOR-CON) CR tablet 40 mEq  40 mEq Oral Once Roxan Hockey, MD      . pravastatin (PRAVACHOL) tablet 40 mg  40 mg Oral q1800 Courage Emokpae, MD      . senna (SENOKOT) tablet 8.6 mg  1 tablet Oral BID Roxan Hockey, MD      . sertraline (  ZOLOFT) tablet 50 mg  50 mg Oral Daily Courage Emokpae, MD      . sodium chloride flush (NS) 0.9 % injection 3 mL  3 mL Intravenous Q12H Roxan Hockey, MD   3 mL at 06/18/16 2200  . sodium chloride flush (NS) 0.9 % injection 3 mL  3 mL Intravenous PRN Roxan Hockey, MD      . traZODone (DESYREL) tablet 50 mg  50 mg Oral QHS PRN Roxan Hockey, MD      . zolpidem (AMBIEN) tablet 5 mg  5 mg Oral QHS PRN Roxan Hockey, MD        Allergies as of 06/18/2016 - Review Complete 06/18/2016  Allergen Reaction Noted  . Aspirin Other (See Comments) 01/31/2014  . Lisinopril Cough 04/23/2016  . Sudafed [pseudoephedrine hcl]  03/16/2016    Past Medical History:  Diagnosis Date  . Anxiety   . Breast cancer [facility]) 2008   right - s/p lumpectomy->chemo, radiation  . Depression   . Dysrhythmia    hx SVT  . GERD (gastroesophageal reflux disease)   . H/O: hysterectomy   . Hiatal hernia   . History of cancer chemotherapy   . History of radiation therapy   . Hyperlipidemia   . Nonischemic cardiomyopathy ([place])   . SVT (supraventricular tachycardia) ([place])    short RP SVT documented 5/14  . Systolic CHF [facility])     Past Surgical History:  Procedure Laterality Date  . ABDOMINAL HYSTERECTOMY  1994   fibroids,   . BREAST SURGERY Right 2008   lumpectomy, cancer  . CHOLECYSTECTOMY  1999  . COLONOSCOPY  2008   Dr. Oneida Alar: multiple polyps. Path not available at time of visit.   Marland Kitchen COLONOSCOPY WITH PROPOFOL N/A 04/25/2014   Dr. Oneida Alar: Multiple tubular adenomas removed. Diverticulosis.  Moderate internal hemorrhoids. Next colonoscopy planned for August 2018.  Marland Kitchen POLYPECTOMY N/A 04/25/2014   Procedure: POLYPECTOMY;  Surgeon: Danie Binder, MD;  Location: [facility];  Service: Endoscopy;  Laterality: N/A;  Ascending and Decending Colon x3 , Transverse colon x2, rectal    Family History  Problem Relation Age of Onset  . Colon cancer Father 38    died at age 75  . Hypertension Sister   . Cancer Brother 13    prostate    Social History   Social History  . Marital status: Married    Spouse name: N/A  . Number of children: 2  . Years of education: N/A   Occupational History  . disabled  Unemployed   Social History Main Topics  . Smoking status: Never Smoker  . Smokeless tobacco: Never Used  . Alcohol use No  . Drug use: No  . Sexual activity: Yes    Birth control/ protection: Surgical   Other Topics Concern  . Not on file   Social History Narrative   Lives in [place] with family.  Does not routinely exercise.     ROS:  General: Negative for  weight loss, fever, chills, fatigue, weakness. See hpi. Eyes: Negative for vision changes.  ENT: Negative for hoarseness, difficulty swallowing , nasal congestion. CV: Negative for chest pain, angina, palpitations, dyspnea on exertion, peripheral edema.  Respiratory: Negative for dyspnea at rest, dyspnea on exertion, cough, sputum, wheezing.  GI: See history of present illness. GU:  Negative for dysuria, hematuria, urinary incontinence, urinary frequency, nocturnal urination.  MS: Negative for joint pain, low back pain.  Derm: Negative for rash or itching.  Neuro: Negative for weakness, abnormal sensation,  seizure, frequent headaches, memory loss, confusion.  Psych: Negative for anxiety, depression, suicidal ideation, hallucinations.  Endo: Negative for unusual weight change.  Heme: Negative for bruising or bleeding. Allergy: Negative for rash or hives.       Physical Examination: Vital signs in last 24  hours: Temp:  [97.6 F (36.4 C)-99.2 F (37.3 C)] 97.6 F (36.4 C) (09/28 0601) Pulse Rate:  [65-83] 75 (09/28 0601) Resp:  [18-19] 18 (09/28 0601) BP: (130-155)/(58-80) 130/69 (09/28 0601) SpO2:  [89 %-95 %] 92 % (09/28 0601) Weight:  [184 lb 15.5 oz (83.9 kg)-186 lb (84.4 kg)] 184 lb 15.5 oz (83.9 kg) (09/27 2021) Last BM Date: 06/17/16  General: Well-nourished, well-developed in no acute distress. Husband at bedside.  Head: Normocephalic, atraumatic.   Eyes: Conjunctiva pink, no icterus. Mouth: Oropharyngeal mucosa moist and pink , no lesions erythema or exudate.NGT with bilious drainage. Neck: Supple without thyromegaly, masses, or lymphadenopathy.  Lungs: Clear to auscultation bilaterally.  Heart: Regular rate and rhythm, no murmurs rubs or gallops.  Abdomen: Bowel sounds are normal, mild epigastric tenderness, nondistended, no hepatosplenomegaly or masses, no abdominal bruits or    hernia , no rebound or guarding.   Rectal: heme negative in ER. Extremities: No lower extremity edema, clubbing, deformity.  Neuro: Alert and oriented x 4 , grossly normal neurologically.  Skin: Warm and dry, no rash or jaundice.   Psych: Alert and cooperative, normal mood and affect.        Intake/Output from previous day: 09/27 0701 - 09/28 0700 In: 1050 [I.V.:1000; IV Piggyback:50] Out: 1175 [Emesis/NG output:1175] Intake/Output this shift: No intake/output data recorded.  Lab Results: CBC  Recent Labs  06/18/16 1117 06/19/16 0543  WBC 15.9* 16.1*  HGB 11.9* 11.5*  HCT 36.2 35.6*  MCV 80.6 81.3  PLT 312 312   BMET  Recent Labs  06/18/16 1117 06/19/16 0543  NA 140 138  K 3.4* 3.2*  CL 104 104  CO2 26 24  GLUCOSE 94 93  BUN 12 9  CREATININE 1.01* 0.82  CALCIUM 8.6* 7.9*   LFT  Recent Labs  06/18/16 1117  BILITOT 0.7  ALKPHOS 97  AST 22  ALT 14  PROT 7.5  ALBUMIN 3.6    Lipase  Recent Labs  06/18/16 1117  LIPASE 22    PT/INR No results for input(s):  LABPROT, INR in the last 72 hours.    Imaging Studies: Ct Abdomen Pelvis W Contrast  Result Date: 06/18/2016 CLINICAL DATA:  Referring epigastric pain for 1 month, vomiting on and off for 2 days. Leukocytosis. EXAM: CT ABDOMEN AND PELVIS WITH CONTRAST TECHNIQUE: Multidetector CT imaging of the abdomen and pelvis was performed using the standard protocol following bolus administration of intravenous contrast. CONTRAST:  134mL ISOVUE-300 IOPAMIDOL (ISOVUE-300) INJECTION 61% COMPARISON:  CT exams from 03/04/2007 FINDINGS: Lower chest: Small right pleural effusion. Small to moderate-sized hiatal hernia. Distal esophageal circumferential thickening up to 9 mm with some enteric contrast seen in the distal esophagus and hiatal hernia possibly from reflux or incomplete emptying. Left lower lobe and lingular ground-glass opacities possibly representing areas of pneumonitis and cortical thickness. Sequela of CHF and/or hypoventilatory change are also possibilities. Hepatobiliary: Cholecystectomy. Mild intrahepatic ductal dilatation which can be seen in the setting prior cholecystectomy. 6 mm right hepatic hypodensity is smaller than on prior exam, previously having measured 9 mm. Small hemangioma is a possibility. A 3 mm left hepatic hypodensity too small to further characterize the stable since 2008, benign finding. Pancreas: Thin  and attenuated along the body and tail. Slightly more bulbous appearance without focal mass at the pancreatic head. There appears to be some mild ectasia of the distal pancreatic duct up to 6 mm at the level of the portal and splenic vein confluence. Spleen: Normal in size without focal abnormality. Adrenals/Urinary Tract: Stable 7 mm cyst in the upper pole the right kidney. No obstructive uropathy or enhancing mass lesions. The ureters are unremarkable. Bladder is nondistended. Stomach/Bowel: There is a large amount of retained oral contrast within the stomach. There is thickening of the  gastric antrum. The duodenum is thickened and distended in appearance as with similar appearance of the proximal jejunum. Beyond this, the caliber of small and large bowel are normal. There is scattered diverticulosis along the descending and sigmoid colon. There is a small amount of free fluid in the right upper quadrant along the left pericolic gutter extending into the pelvis. Vascular/Lymphatic: Small subcentimeter mesenteric lymph nodes. Minimal atherosclerosis of the distal aorta. No aneurysm or dissection. Reproductive: Hysterectomy.  No adnexal mass. Other: Small amount of free fluid along the left pericolic gutter, pelvis and right upper quadrant. Musculoskeletal: No acute osseous abnormality. IMPRESSION: Moderate to markedly thickened gastric antrum with duodenal thickening and distension consistent with the gastric antritis and duodenitis. Given normal appearance of the body and tail of pancreas, pancreatitis believed less likely but can be correlated with pancreatic enzymes. Moderate to marked retention of oral contrast within the stomach. Patient would benefit from NG tube placement to decompress stomach. Left lower lobe ground-glass opacities possibly representing areas of alveolitis peritonitis, sequela of CHF or hypoventilatory change. Hysterectomy. Cholecystectomy. Hepatic and renal hypodensities consistent with cysts. Electronically Signed   By: Ashley Royalty M.D.   On: 06/18/2016 16:35   Dg Abd Acute W/chest  Result Date: 06/18/2016 CLINICAL DATA:  Cough for 3 weeks, vomiting for 2 days EXAM: DG ABDOMEN ACUTE W/ 1V CHEST COMPARISON:  04/09/2016 FINDINGS: Cardiomegaly again noted. No acute infiltrate or pulmonary edema. Surgical clips are noted in right axilla. Calcified right hilar and mediastinal lymph nodes are again noted. There is normal small bowel gas pattern. Postcholecystectomy surgical clips are noted. Surgical clips are noted within pelvis. IMPRESSION: Negative abdominal radiographs.   No acute cardiopulmonary disease. Electronically Signed   By: Lahoma Crocker M.D.   On: 06/18/2016 11:59  [4 week]   Impression: 61 -year-old female presenting with 2 week history of progressive GI symptoms. Initially difficulty managing solid foods, nausea. Eventual vomiting and upper abdominal discomfort. Denies hematemesis. Had significant heartburn with this illness. Chronically on PPI and Pepcid. Ibuprofen a couple times per week. On presentation she had leukocytosis. CT with marked abnormality of the stomach and duodenum, also esophageal wall thickening. Incomplete emptying of CT contrast noted as well. She has been decompressed overnight with NG tube. No evidence of hematemesis. Recommend EGD for further evaluation.  I have discussed the risks, alternatives, benefits with regards to but not limited to the risk of reaction to medication, bleeding, infection, perforation and the patient is agreeable to proceed. Written consent to be obtained.   Plan: 1. EGD with deep sedation in OR (previously failed conscious sedation).  I have discussed the risks, alternatives, benefits with regards to but not limited to the risk of reaction to medication, bleeding, infection, perforation and the patient is agreeable to proceed. Written consent to be obtained. 2. Hold heparin for now, resume after EGD based on findings.   We would like to thank you for the  opportunity to participate in the care of SAY MILICH.  Laureen Ochs. Bernarda Caffey [facility] [phone] 9/28/201710:04 AM     LOS: 0 days

## 2016-06-19 NOTE — Care Management Obs Status (Signed)
[place] NOTIFICATION   Patient Details  Name: Tara [name] MRN: [id number] Date of Birth: [birth year]   Medicare Observation Status Notification Given:  Yes    Sherald Barge, RN 06/19/2016, 10:27 AM

## 2016-06-19 NOTE — Anesthesia Postprocedure Evaluation (Signed)
Anesthesia Post Note  Patient: Tara [name]  Procedure(s) Performed: Procedure(s) (LRB): ESOPHAGOGASTRODUODENOSCOPY (EGD) WITH PROPOFOL (N/A) BIOPSY  Patient location during evaluation: PACU Anesthesia Type: MAC Level of consciousness: awake and alert and oriented Pain management: pain level controlled Respiratory status: spontaneous breathing and patient connected to nasal cannula oxygen Cardiovascular status: stable Anesthetic complications: no    Last Vitals:  Vitals:   06/19/16 1415 06/19/16 1500  BP:  (!) 149/77  Pulse:    Resp: (!) 24 (!) 30  Temp:  36.7 C    Last Pain:  Vitals:   06/19/16 1329  TempSrc: Oral  PainSc: 7                  ADAMS, AMY A

## 2016-06-19 NOTE — Anesthesia Procedure Notes (Signed)
Procedure Name: MAC Date/Time: 06/19/2016 2:24 PM Performed by: Andree Elk, Aleiyah Halpin A Pre-anesthesia Checklist: Patient identified, Emergency Drugs available, Suction available and Patient being monitored Oxygen Delivery Method: Simple face mask

## 2016-06-19 NOTE — Progress Notes (Signed)
Tara [name] [id number] DOB: 05/01/1946 DOA: 06/18/2016 PCP: [name], MD  Brief narrative: 70 y/o ? Known history of SVT Hyperlipidemia Nonischemic cardiomyopathy Prior breast cancer status post XRT and chemotherapy Reflux Depression Prior history of colonoscopy 2015-6 polyps. Osteoporosis on bisphosphonates TAH/BSO Patient was admitted overnight 06/18/2016 with nausea vomiting nonbloody no diarrhea no chills nor rigors no fevers. It is noted that the patient does not have a history of diabetes mellitus   Started to have nausea vomiting 2-3 days prior to admission, seemingly associated with certain types of foods, states it happens 2-3 hours after eating, known history of hiatal hernia He has been taking nonsteroidals since June or July after fall onto both knees but only takes about twice a week and Dr. only 2 or 3 tablets. In the emergency room CT scan was performed confirming possible gastroduodenitis and gastroenterology was consulted  Past medical history-As per Problem list Chart reviewed as below- Reviewed  Consultants:  Gastroenterology F 2  Procedures:  None yet  Antibiotics:  no   Subjective   Doing fair. NG tube in situ Has got a lot of greenish gastric contents but is not having any further abdominal pain Had some episodic diarrhea but no significant abdominal discomfort at present Overall if that'll improve Tells me she is going for scope later on today   Objective    Interim History:   Telemetry:    Objective: Vitals:   06/18/16 1529 06/18/16 1821 06/18/16 2021 06/19/16 0601  BP: 130/67 155/68 (!) 148/68 130/69  Pulse: 69 83 74 75  Resp: 19 18 18 18   Temp:   98.2 F (36.8 C) 97.6 F (36.4 C)  TempSrc:   Oral Oral  SpO2: (!) 89% 91% 91% 92%  Weight:   83.9 kg (184 lb 15.5 oz)   Height:   4\' 11"  (1.499 m)     Intake/Output Summary (Last 24 hours) at 06/19/16 1003 Last data filed at 06/19/16 0700  Gross per 24 hour    Intake             1050 ml  Output             1175 ml  Net             -125 ml    Exam:  Alert obese pleasant oriented no apparent distress S1-S2 no murmur rub or gallop Chest clinically clear Abdomen is visibly distended however no hepatosplenomegaly No rebound No guarding No adventitious sounds however bowel sounds are scarce   Data Reviewed: Basic Metabolic Panel:  Recent Labs Lab 06/18/16 1117 06/19/16 0543  NA 140 138  K 3.4* 3.2*  CL 104 104  CO2 26 24  GLUCOSE 94 93  BUN 12 9  CREATININE 1.01* 0.82  CALCIUM 8.6* 7.9*   Liver Function Tests:  Recent Labs Lab 06/18/16 1117  AST 22  ALT 14  ALKPHOS 97  BILITOT 0.7  PROT 7.5  ALBUMIN 3.6    Recent Labs Lab 06/18/16 1117  LIPASE 22   No results for input(s): AMMONIA in the last 168 hours. CBC:  Recent Labs Lab 06/18/16 1117 06/19/16 0543  WBC 15.9* 16.1*  HGB 11.9* 11.5*  HCT 36.2 35.6*  MCV 80.6 81.3  PLT 312 312   Cardiac Enzymes: No results for input(s): CKTOTAL, CKMB, CKMBINDEX, TROPONINI in the last 168 hours. BNP: Invalid input(s): POCBNP CBG: No results for input(s): GLUCAP in the last 168 hours.  No results found for this or any  previous visit (from the past 240 hour(s)).   Studies:              All Imaging reviewed and is as per above notation   Scheduled Meds: . azelastine  2 spray Each Nare BID  . carvedilol  6.25 mg Oral BID  . docusate sodium  100 mg Oral BID  . famotidine (PEPCID) IV  20 mg Intravenous Q12H  . fluticasone  2 spray Each Nare Daily  . heparin  5,000 Units Subcutaneous Q8H  . loratadine  10 mg Oral Daily  . metoCLOPramide  10 mg Oral TID  . pantoprazole  40 mg Oral Daily  . potassium chloride  40 mEq Oral Once  . pravastatin  40 mg Oral q1800  . senna  1 tablet Oral BID  . sertraline  50 mg Oral Daily  . sodium chloride flush  3 mL Intravenous Q12H   Continuous Infusions: . sodium chloride 125 mL/hr at 06/18/16 1205  . dextrose 5 % and 0.45 %  NaCl with KCl 20 mEq/L 125 mL/hr at 06/18/16 2300     Assessment/Plan:  1. ? Hiatal hernia versus ulcer versus delayed gastric emptying-given? Duodenitis on CT scan EGD is being scheduled for later today. She should continue Reglan 10 mg 3 times a day-it looks that she's been taking this at home. We will keep her nothing by mouth and keep the NG tube in situ until further workup is performed--- she is also on bisphosphonates that can occasionally cause gastric erosions 2. Leukocytosis-there is a question of duodenitis-no antibiotics needed at present, would continue monitoring white count and recheck in the morning 3. SVT, continue Coreg 6.25 twice a day 4. Reflux-continue Pepcid 20 mg IV every 12, pantoprazole 40 daily 1 5. Bipolar-continue sertraline 50 mg daily Ativan on hold at present time 6. Hypokalemia replace with potassium chloride 40 mEq daily 7. Hyperlipidemia-Continue Pravachol 40 every afternoon    Await further workup with endoscopy and reassess Keep in observation status may be able to discharge if able to keep down food in the next 24 hours  [name], MD  [facility] Pager [phone] 06/19/2016, 10:03 AM    LOS: 0 days

## 2016-06-19 NOTE — Care Management Note (Signed)
Case Management Note  Patient Details  Name: Tara [name] MRN: [id number] Date of Birth: [birth year]  Subjective/Objective:                  Pt is from home, admitted with N/V. Pt lives with her husband and is ind with ADL's. She has PCP, drives herself to appointments and has no difficulty affording medications. Pt plans to return home with self care.   Action/Plan: No CM needs anticipated.   Expected Discharge Date:    06/21/2016              Expected Discharge Plan:  Home/Self Care  In-House Referral:  NA  Discharge planning Services  CM Consult  Post Acute Care Choice:  NA Choice offered to:  NA  DME Arranged:    DME Agency:     HH Arranged:    HH Agency:     Status of Service:  Completed, signed off  If discussed at [employer] of Stay Meetings, dates discussed:    Additional Comments:  Sherald Barge, RN 06/19/2016, 2:55 PM

## 2016-06-19 NOTE — Anesthesia Preprocedure Evaluation (Signed)
Anesthesia Evaluation  Patient identified by MRN, date of birth, ID band Patient awake    Reviewed: Allergy & Precautions, H&P , NPO status , Patient's Chart, lab work & pertinent test results  Airway Mallampati: III  TM Distance: >3 FB   Mouth opening: Limited Mouth Opening  Dental  (+) Teeth Intact   Pulmonary shortness of breath and with exertion,    breath sounds clear to auscultation       Cardiovascular hypertension, Pt. on medications +CHF  + dysrhythmias Supra Ventricular Tachycardia  Rhythm:Regular Rate:Normal     Neuro/Psych PSYCHIATRIC DISORDERS Anxiety Depression    GI/Hepatic hiatal hernia, GERD  Medicated and Controlled,  Endo/Other    Renal/GU      Musculoskeletal   Abdominal   Peds  Hematology   Anesthesia Other Findings   Reproductive/Obstetrics                             Anesthesia Physical Anesthesia Plan  ASA: III  Anesthesia Plan: MAC   Post-op Pain Management:    Induction: Intravenous  Airway Management Planned: Simple Face Mask  Additional Equipment:   Intra-op Plan:   Post-operative Plan:   Informed Consent: I have reviewed the patients History and Physical, chart, labs and discussed the procedure including the risks, benefits and alternatives for the proposed anesthesia with the patient or authorized representative who has indicated his/her understanding and acceptance.     Plan Discussed with:   Anesthesia Plan Comments:         Anesthesia Quick Evaluation

## 2016-06-19 NOTE — Transfer of Care (Signed)
Immediate Anesthesia Transfer of Care Note  Patient: Tara [name]  Procedure(s) Performed: Procedure(s) with comments: ESOPHAGOGASTRODUODENOSCOPY (EGD) WITH PROPOFOL (N/A) BIOPSY - gastric duodenum  Patient Location: PACU  Anesthesia Type:MAC  Level of Consciousness: awake, alert , oriented and patient cooperative  Airway & Oxygen Therapy: Patient Spontanous Breathing and Patient connected to nasal cannula oxygen  Post-op Assessment: Report given to RN and Post -op Vital signs reviewed and stable  Post vital signs: Reviewed and stable  Last Vitals:  Vitals:   06/19/16 1400 06/19/16 1415  BP: (!) 150/76   Pulse:    Resp: (!) 31 (!) 24  Temp:      Last Pain:  Vitals:   06/19/16 1329  TempSrc: Oral  PainSc: 7       Patients Stated Pain Goal: 3 ([id number])  Complications: No apparent anesthesia complications

## 2016-06-20 DIAGNOSIS — K219 Gastro-esophageal reflux disease without esophagitis: Secondary | ICD-10-CM

## 2016-06-20 DIAGNOSIS — K3189 Other diseases of stomach and duodenum: Secondary | ICD-10-CM

## 2016-06-20 DIAGNOSIS — R1110 Vomiting, unspecified: Secondary | ICD-10-CM

## 2016-06-20 DIAGNOSIS — K2980 Duodenitis without bleeding: Secondary | ICD-10-CM

## 2016-06-20 DIAGNOSIS — K2970 Gastritis, unspecified, without bleeding: Secondary | ICD-10-CM

## 2016-06-20 DIAGNOSIS — K2990 Gastroduodenitis, unspecified, without bleeding: Principal | ICD-10-CM

## 2016-06-20 LAB — CBC WITH DIFFERENTIAL/PLATELET
BASOS ABS: 0 10*3/uL (ref 0.0–0.1)
BASOS PCT: 0 %
EOS ABS: 4.8 10*3/uL — AB (ref 0.0–0.7)
Eosinophils Relative: 32 %
HCT: 34.2 % — ABNORMAL LOW (ref 36.0–46.0)
Hemoglobin: 11 g/dL — ABNORMAL LOW (ref 12.0–15.0)
Lymphocytes Relative: 14 %
Lymphs Abs: 2.1 10*3/uL (ref 0.7–4.0)
MCH: 25.8 pg — ABNORMAL LOW (ref 26.0–34.0)
MCHC: 32.2 g/dL (ref 30.0–36.0)
MCV: 80.3 fL (ref 78.0–100.0)
MONO ABS: 0.9 10*3/uL (ref 0.1–1.0)
Monocytes Relative: 6 %
NEUTROS ABS: 7.3 10*3/uL (ref 1.7–7.7)
NEUTROS PCT: 48 %
Platelets: 277 10*3/uL (ref 150–400)
RBC: 4.26 MIL/uL (ref 3.87–5.11)
RDW: 15.7 % — AB (ref 11.5–15.5)
WBC: 15.1 10*3/uL — ABNORMAL HIGH (ref 4.0–10.5)

## 2016-06-20 LAB — COMPREHENSIVE METABOLIC PANEL
ALBUMIN: 3 g/dL — AB (ref 3.5–5.0)
ALK PHOS: 82 U/L (ref 38–126)
ALT: 14 U/L (ref 14–54)
ANION GAP: 8 (ref 5–15)
AST: 19 U/L (ref 15–41)
BILIRUBIN TOTAL: 0.6 mg/dL (ref 0.3–1.2)
BUN: 6 mg/dL (ref 6–20)
CALCIUM: 7.8 mg/dL — AB (ref 8.9–10.3)
CO2: 23 mmol/L (ref 22–32)
Chloride: 105 mmol/L (ref 101–111)
Creatinine, Ser: 0.8 mg/dL (ref 0.44–1.00)
GFR calc non Af Amer: >60 mL/min (ref >60)
Glucose, Bld: 106 mg/dL — ABNORMAL HIGH (ref 65–99)
Potassium: 3.1 mmol/L — ABNORMAL LOW (ref 3.5–5.1)
SODIUM: 136 mmol/L (ref 135–145)
TOTAL PROTEIN: 6.6 g/dL (ref 6.5–8.1)

## 2016-06-20 MED ORDER — POTASSIUM CHLORIDE IN NACL 40-0.9 MEQ/L-% IV SOLN
INTRAVENOUS | Status: DC
  Administered 2016-06-20 – 2016-06-21 (×4): 125 mL/h via INTRAVENOUS
  Administered 2016-06-22: 75 mL/h via INTRAVENOUS

## 2016-06-20 MED ORDER — SODIUM CHLORIDE 0.9 % IV SOLN: INTRAVENOUS | Status: DC

## 2016-06-20 MED ORDER — ALBUTEROL SULFATE (2.5 MG/3ML) 0.083% IN NEBU
2.5000 mg | INHALATION_SOLUTION | Freq: Three times a day (TID) | RESPIRATORY_TRACT | Status: DC
  Administered 2016-06-21 – 2016-06-25 (×13): 2.5 mg via RESPIRATORY_TRACT
  Filled 2016-06-20 (×13): qty 3

## 2016-06-20 MED ORDER — GUAIFENESIN-DM 100-10 MG/5ML PO SYRP
5.0000 mL | ORAL_SOLUTION | ORAL | Status: DC | PRN
  Administered 2016-06-20 – 2016-06-21 (×2): 5 mL via ORAL
  Filled 2016-06-20 (×2): qty 5

## 2016-06-20 NOTE — Addendum Note (Signed)
Addendum  created 06/20/16 0959 by Mickel Baas, CRNA   Sign clinical note

## 2016-06-20 NOTE — Progress Notes (Signed)
Tara [name] [id number] DOB: 1945-10-20 DOA: 06/18/2016 PCP: [name], MD  Brief narrative: 70 y/o ? Known history of SVT Hyperlipidemia Nonischemic cardiomyopathy Prior breast cancer status post XRT and chemotherapy Reflux Depression Prior history of colonoscopy 2015-6 polyps. Osteoporosis on bisphosphonates TAH/BSO Patient was admitted overnight 06/18/2016 with nausea vomiting nonbloody no diarrhea no chills nor rigors no fevers. It is noted that the patient does not have a history of diabetes mellitus   Started to have nausea vomiting 2-3 days prior to admission, seemingly associated with certain types of foods, states it happens 2-3 hours after eating, known history of hiatal hernia He has been taking nonsteroidals since June or July after fall onto both knees but only takes about twice a week and Dr. only 2 or 3 tablets. In the emergency room CT scan was performed confirming possible gastroduodenitis and gastroenterology was consulted  The patient ultimately underwent EGD showing bulky and narrowed lower esophagus  Past medical history-As per Problem list Chart reviewed as below- Reviewed  Consultants:  Gastroenterology F 2  Procedures:  Endoscopy 06/19/16  Antibiotics:  no   Subjective   Doing fair. NG tube in situHas had a lot more secretions and is not on intermittent wall suctioning No chest pain No vomiting Asking about diet Still mild abdominal pain Passing stool and did not really sleep well last p.m.     Objective    Interim History:   Telemetry:    Objective: Vitals:   06/19/16 1515 06/19/16 1530 06/19/16 2206 06/20/16 0642  BP: (!) 114/103 (!) 147/88 (!) 159/78 (!) 150/75  Pulse: 80 74 90 93  Resp: 17 16 20 20   Temp:   98.7 F (37.1 C) 98.5 F (36.9 C)  TempSrc:   Oral Oral  SpO2: 91% 93% 95% 90%  Weight:      Height:        Intake/Output Summary (Last 24 hours) at 06/20/16 0854 Last data filed at 06/19/16  2215  Gross per 24 hour  Intake              240 ml  Output              350 ml  Net             -110 ml    Exam:  Alert obese pleasant oriented no apparent distress S1-S2 no murmur rub or gallop Chest clinically clear Abdomen is visibly distended  no hepatosplenomegaly No rebound No guarding No adventitious sounds however bowel sounds are scarce   Data Reviewed: Basic Metabolic Panel:  Recent Labs Lab 06/18/16 1117 06/19/16 0543 06/20/16 0607  NA 140 138 136  K 3.4* 3.2* 3.1*  CL 104 104 105  CO2 26 24 23   GLUCOSE 94 93 106*  BUN 12 9 6   CREATININE 1.01* 0.82 0.80  CALCIUM 8.6* 7.9* 7.8*   Liver Function Tests:  Recent Labs Lab 06/18/16 1117 06/20/16 0607  AST 22 19  ALT 14 14  ALKPHOS 97 82  BILITOT 0.7 0.6  PROT 7.5 6.6  ALBUMIN 3.6 3.0*    Recent Labs Lab 06/18/16 1117  LIPASE 22   No results for input(s): AMMONIA in the last 168 hours. CBC:  Recent Labs Lab 06/18/16 1117 06/19/16 0543 06/20/16 0607  WBC 15.9* 16.1* 15.1*  NEUTROABS  --   --  7.3  HGB 11.9* 11.5* 11.0*  HCT 36.2 35.6* 34.2*  MCV 80.6 81.3 80.3  PLT 312 312 277   Cardiac Enzymes:  No results for input(s): CKTOTAL, CKMB, CKMBINDEX, TROPONINI in the last 168 hours. BNP: Invalid input(s): POCBNP CBG:  Recent Labs Lab 06/19/16 1507  GLUCAP 89    No results found for this or any previous visit (from the past 240 hour(s)).   Studies:              All Imaging reviewed and is as per above notation   Scheduled Meds: . azelastine  2 spray Each Nare BID  . carvedilol  6.25 mg Oral BID  . docusate sodium  100 mg Oral BID  . famotidine (PEPCID) IV  20 mg Intravenous Q12H  . fluticasone  2 spray Each Nare Daily  . loratadine  10 mg Oral Daily  . metoCLOPramide  10 mg Oral TID  . pantoprazole  40 mg Oral Daily  . potassium chloride  40 mEq Oral Once  . pravastatin  40 mg Oral q1800  . senna  1 tablet Oral BID  . sertraline  50 mg Oral Daily  . sodium chloride flush   3 mL Intravenous Q12H   Continuous Infusions: . sodium chloride 125 mL/hr at 06/18/16 1205  . dextrose 5 % and 0.45 % NaCl with KCl 20 mEq/L 125 mL/hr at 06/19/16 2300     Assessment/Plan:  1.  EGD performed on 06/19/2016 shows narrowing of the lower esophagus. She should continue Reglan 10 mg 3 times a day-keep her nothing by mouth and keep the NG tube in situ until further workup is performed--gastroenterology to comment on graduation of diet  2. Leukocytosis-there is a question of duodenitis-no antibiotics needed at present,white count is persistently high in the 15 range . There is a question of lymphoma based on the EGD We await biopsies  3. SVT, continue Coreg 6.25 twice a day 4. Reflux-continue Pepcid 20 mg IV every 12, pantoprazole 40 daily 1 5. Bipolar-continue sertraline 50 mg daily Ativan on hold at present time 6. Hypokalemia replace with potassium chloride 40 mEq daily --increased on 06/20/2006 to 40 mEq in the normal saline at 1 25 cc an hour, repeat labs a.m. 7. Hyperlipidemia-Continue Pravachol 40 every afternoon   Probably will need to be in patient still has NG tube in situ and do not think this will resolve in the next 24-48 hours Repeat labs a.m.  [name], MD  [facility] Pager [phone] 06/20/2016, 8:54 AM    LOS: 1 day

## 2016-06-20 NOTE — Anesthesia Postprocedure Evaluation (Signed)
Anesthesia Post Note  Patient: Tara [name]  Procedure(s) Performed: Procedure(s) (LRB): ESOPHAGOGASTRODUODENOSCOPY (EGD) WITH PROPOFOL (N/A) BIOPSY  Patient location during evaluation: Nursing Unit Anesthesia Type: MAC Level of consciousness: awake and alert and oriented Pain management: pain level controlled Respiratory status: spontaneous breathing Cardiovascular status: stable : No further nausea;  Anesthetic complications: no    Last Vitals:  Vitals:   06/19/16 2206 06/20/16 0642  BP: (!) 159/78 (!) 150/75  Pulse: 90 93  Resp: 20 20  Temp: 37.1 C 36.9 C    Last Pain:  Vitals:   06/20/16 0642  TempSrc: Oral  PainSc:                  [name] A

## 2016-06-20 NOTE — Progress Notes (Signed)
Subjective: States she's feeling somewhat better. Asked about new NGT, has a sore throat but is using Chloraseptic Spray (which helps). No bowel movement since day of admission. Denies chest pain, N/V. Has some abdominal pain with deep palpation earlier in the morning but no resting abdominal pain. Reviewed procedure results with the patient. No notable bleeding in the past 24 hours. No other upper or lower GI complaints.  Objective: Vital signs in last 24 hours: Temp:  [98.1 F (36.7 C)-98.7 F (37.1 C)] 98.5 F (36.9 C) (09/29 0642) Pulse Rate:  [74-93] 93 (09/29 0642) Resp:  [16-31] 20 (09/29 0642) BP: (114-181)/(70-103) 150/75 (09/29 0642) SpO2:  [90 %-95 %] 90 % (09/29 0642) Last BM Date: 06/17/16 General:   Alert and oriented, pleasant Head:  Normocephalic and atraumatic. Eyes:  No icterus, sclera clear. Conjuctiva pink.  Noes: NGT present left nare, well-secured and attached to LIS wall suction with connister between 1/2 and 3/4 full of green bile-like material Heart:  S1, S2 present, no murmurs noted.  Lungs: Minimal end-expiratory bilateral rhonchi noted. Otherwise clear. Abdomen:  Bowel sounds present, distended but not tense, somewhat more firm, no abdominal TTP. No HSM or hernias noted. No rebound or guarding. Msk:  Symmetrical without gross deformities. Pulses:  Normal bilateral DP pulses noted. Extremities:  Without clubbing or edema. Neurologic:  Alert and  oriented x4 Psych:  Alert and cooperative. Normal mood and affect.  Intake/Output from previous day: 09/28 0701 - 09/29 0700 In: 240 [P.O.:240] Out: 350 [Urine:200; Emesis/NG output:150] Intake/Output this shift: No intake/output data recorded.  Lab Results:  Recent Labs  06/18/16 1117 06/19/16 0543 06/20/16 0607  WBC 15.9* 16.1* 15.1*  HGB 11.9* 11.5* 11.0*  HCT 36.2 35.6* 34.2*  PLT 312 312 277   BMET  Recent Labs  06/18/16 1117 06/19/16 0543 06/20/16 0607  NA 140 138 136  K 3.4* 3.2*  3.1*  CL 104 104 105  CO2 26 24 23   GLUCOSE 94 93 106*  BUN 12 9 6   CREATININE 1.01* 0.82 0.80  CALCIUM 8.6* 7.9* 7.8*   LFT  Recent Labs  06/18/16 1117 06/20/16 0607  PROT 7.5 6.6  ALBUMIN 3.6 3.0*  AST 22 19  ALT 14 14  ALKPHOS 97 82  BILITOT 0.7 0.6   PT/INR No results for input(s): LABPROT, INR in the last 72 hours. Hepatitis Panel No results for input(s): HEPBSAG, HCVAB, HEPAIGM, HEPBIGM in the last 72 hours.   Studies/Results: Ct Abdomen Pelvis W Contrast  Result Date: 06/18/2016 CLINICAL DATA:  Referring epigastric pain for 1 month, vomiting on and off for 2 days. Leukocytosis. EXAM: CT ABDOMEN AND PELVIS WITH CONTRAST TECHNIQUE: Multidetector CT imaging of the abdomen and pelvis was performed using the standard protocol following bolus administration of intravenous contrast. CONTRAST:  156mL ISOVUE-300 IOPAMIDOL (ISOVUE-300) INJECTION 61% COMPARISON:  CT exams from 03/04/2007 FINDINGS: Lower chest: Small right pleural effusion. Small to moderate-sized hiatal hernia. Distal esophageal circumferential thickening up to 9 mm with some enteric contrast seen in the distal esophagus and hiatal hernia possibly from reflux or incomplete emptying. Left lower lobe and lingular ground-glass opacities possibly representing areas of pneumonitis and cortical thickness. Sequela of CHF and/or hypoventilatory change are also possibilities. Hepatobiliary: Cholecystectomy. Mild intrahepatic ductal dilatation which can be seen in the setting prior cholecystectomy. 6 mm right hepatic hypodensity is smaller than on prior exam, previously having measured 9 mm. Small hemangioma is a possibility. A 3 mm left hepatic hypodensity too small to further  characterize the stable since 2008, benign finding. Pancreas: Thin and attenuated along the body and tail. Slightly more bulbous appearance without focal mass at the pancreatic head. There appears to be some mild ectasia of the distal pancreatic duct up to 6  mm at the level of the portal and splenic vein confluence. Spleen: Normal in size without focal abnormality. Adrenals/Urinary Tract: Stable 7 mm cyst in the upper pole the right kidney. No obstructive uropathy or enhancing mass lesions. The ureters are unremarkable. Bladder is nondistended. Stomach/Bowel: There is a large amount of retained oral contrast within the stomach. There is thickening of the gastric antrum. The duodenum is thickened and distended in appearance as with similar appearance of the proximal jejunum. Beyond this, the caliber of small and large bowel are normal. There is scattered diverticulosis along the descending and sigmoid colon. There is a small amount of free fluid in the right upper quadrant along the left pericolic gutter extending into the pelvis. Vascular/Lymphatic: Small subcentimeter mesenteric lymph nodes. Minimal atherosclerosis of the distal aorta. No aneurysm or dissection. Reproductive: Hysterectomy.  No adnexal mass. Other: Small amount of free fluid along the left pericolic gutter, pelvis and right upper quadrant. Musculoskeletal: No acute osseous abnormality. IMPRESSION: Moderate to markedly thickened gastric antrum with duodenal thickening and distension consistent with the gastric antritis and duodenitis. Given normal appearance of the body and tail of pancreas, pancreatitis believed less likely but can be correlated with pancreatic enzymes. Moderate to marked retention of oral contrast within the stomach. Patient would benefit from NG tube placement to decompress stomach. Left lower lobe ground-glass opacities possibly representing areas of alveolitis peritonitis, sequela of CHF or hypoventilatory change. Hysterectomy. Cholecystectomy. Hepatic and renal hypodensities consistent with cysts. Electronically Signed   By: Ashley Royalty M.D.   On: 06/18/2016 16:35   Dg Abd Acute W/chest  Result Date: 06/18/2016 CLINICAL DATA:  Cough for 3 weeks, vomiting for 2 days EXAM: DG  ABDOMEN ACUTE W/ 1V CHEST COMPARISON:  04/09/2016 FINDINGS: Cardiomegaly again noted. No acute infiltrate or pulmonary edema. Surgical clips are noted in right axilla. Calcified right hilar and mediastinal lymph nodes are again noted. There is normal small bowel gas pattern. Postcholecystectomy surgical clips are noted. Surgical clips are noted within pelvis. IMPRESSION: Negative abdominal radiographs.  No acute cardiopulmonary disease. Electronically Signed   By: Lahoma Crocker M.D.   On: 06/18/2016 11:59    Assessment: Overall seems slightly improved clinically with larger NGT placement. Some mild abdominal discomfort with deeper palpation RLQ. Leucocytosis improved somewhat this morning down to 15.1 from 16.1. Mild decrease in hgb to 11.0 from 11.5 although seems within her baseline back to 3 months ago (11.3).  Emesis: No further emesis, NGT changed to larger tube during EGD, draining well green-ish bile-like material with wall canister 1/2 to 3/4 full. No nausea this morning. Zofran ordered, Reglan already ordered.  GERD:  No N/V, symptomatic GERD. Is on Protonix and Pepcid. Continue to monitor for recurrent symptoms.  Gastritis/Duodenitis: EGD with abnormal appearance of stomach and duodenum, s/p biopsies. Pathology not back yet. EGD concerning for possible submucosal process such as lymphoma versus angioedema.  Gastric distension: Abdomen distended, NGT in place for retained contrast and stomach decompression; appears to be draining well. Taking Chloraseptic spray for sore throat due to tube. Will likely benefit for continued NGT while stomach remains distended. No indication of ileus or obstruction on CT.     Plan: 1. Await biopsy results 2. Monitor for further emesis or nausea  and treat as needed 3. Continue PPI and Pepcid 4. Monitor H/H for further changes 5. Supportive measures   Thank you for allowing [place] to participate in the care of [place], DNP, AGNP-C Adult &  Gerontological Nurse Practitioner [facility]    LOS: 1 day    06/20/2016, 9:27 AM

## 2016-06-20 NOTE — Care Management Important Message (Signed)
Important Message  Patient Details  Name: Tara [name] MRN: [id number] Date of Birth: [birth year]   Medicare Important Message Given:  Yes    Sherald Barge, RN 06/20/2016, 1:22 PM

## 2016-06-21 ENCOUNTER — Inpatient Hospital Stay (HOSPITAL_COMMUNITY): Payer: Medicare Other

## 2016-06-21 LAB — COMPREHENSIVE METABOLIC PANEL
ALBUMIN: 2.9 g/dL — AB (ref 3.5–5.0)
ALT: 14 U/L (ref 14–54)
AST: 18 U/L (ref 15–41)
Alkaline Phosphatase: 75 U/L (ref 38–126)
Anion gap: 7 (ref 5–15)
BILIRUBIN TOTAL: 0.6 mg/dL (ref 0.3–1.2)
BUN: 5 mg/dL — AB (ref 6–20)
CHLORIDE: 108 mmol/L (ref 101–111)
CO2: 22 mmol/L (ref 22–32)
CREATININE: 0.77 mg/dL (ref 0.44–1.00)
Calcium: 7.6 mg/dL — ABNORMAL LOW (ref 8.9–10.3)
GFR calc Af Amer: >60 mL/min (ref >60)
GLUCOSE: 80 mg/dL (ref 65–99)
Potassium: 4.1 mmol/L (ref 3.5–5.1)
Sodium: 137 mmol/L (ref 135–145)
Total Protein: 6.5 g/dL (ref 6.5–8.1)

## 2016-06-21 LAB — CBC WITH DIFFERENTIAL/PLATELET
Basophils Absolute: 0 10*3/uL (ref 0.0–0.1)
Basophils Relative: 0 %
EOS ABS: 4.5 10*3/uL — AB (ref 0.0–0.7)
EOS PCT: 28 %
HCT: 33.2 % — ABNORMAL LOW (ref 36.0–46.0)
Hemoglobin: 10.7 g/dL — ABNORMAL LOW (ref 12.0–15.0)
LYMPHS ABS: 2 10*3/uL (ref 0.7–4.0)
LYMPHS PCT: 12 %
MCH: 26 pg (ref 26.0–34.0)
MCHC: 32.2 g/dL (ref 30.0–36.0)
MCV: 80.8 fL (ref 78.0–100.0)
MONO ABS: 0.8 10*3/uL (ref 0.1–1.0)
Monocytes Relative: 5 %
Neutro Abs: 9.1 10*3/uL — ABNORMAL HIGH (ref 1.7–7.7)
Neutrophils Relative %: 56 %
PLATELETS: 290 10*3/uL (ref 150–400)
RBC: 4.11 MIL/uL (ref 3.87–5.11)
RDW: 15.9 % — AB (ref 11.5–15.5)
WBC: 16.3 10*3/uL — AB (ref 4.0–10.5)

## 2016-06-21 MED ORDER — METOCLOPRAMIDE HCL 5 MG/ML IJ SOLN
10.0000 mg | Freq: Three times a day (TID) | INTRAMUSCULAR | Status: DC
  Administered 2016-06-21 – 2016-06-22 (×6): 10 mg via INTRAVENOUS
  Filled 2016-06-21 (×6): qty 2

## 2016-06-21 MED ORDER — DOCUSATE SODIUM 50 MG/5ML PO LIQD
100.0000 mg | Freq: Two times a day (BID) | ORAL | Status: DC
  Administered 2016-06-22: 100 mg via ORAL
  Filled 2016-06-21 (×4): qty 10

## 2016-06-21 MED ORDER — PANTOPRAZOLE SODIUM 40 MG IV SOLR
40.0000 mg | Freq: Two times a day (BID) | INTRAVENOUS | Status: DC
  Administered 2016-06-21 – 2016-06-22 (×4): 40 mg via INTRAVENOUS
  Filled 2016-06-21 (×5): qty 40

## 2016-06-21 NOTE — Progress Notes (Signed)
Tara [name] [id number] DOB: September 17, 1946 DOA: 06/18/2016 PCP: [name], MD  Brief narrative: 70 y/o ? Known history of SVT Hyperlipidemia Nonischemic cardiomyopathy Prior breast cancer status post XRT and chemotherapy Reflux Depression Prior history of colonoscopy 2015-6 polyps. Osteoporosis on bisphosphonates TAH/BSO Patient was admitted overnight 06/18/2016 with nausea vomiting nonbloody no diarrhea no chills nor rigors no fevers. It is noted that the patient does not have a history of diabetes mellitus   Started to have nausea vomiting 2-3 days prior to admission, seemingly associated with certain types of foods, states it happens 2-3 hours after eating, known history of hiatal hernia taking nonsteroidals since June or July after fall onto both knees but only takes about twice a week and Dr. only 2 or 3 tablets. In the emergency room CT scan was performed confirming possible gastroduodenitis and gastroenterology was consulted  The patient ultimately underwent EGD showing bulky and narrowed lower esophagus  Past medical history-As per Problem list Chart reviewed as below- Reviewed  Consultants:  Gastroenterology F 2  Procedures:  Endoscopy 06/19/16  Antibiotics:  no   Subjective   Still large output No stool, no flatus No cp No le edema No fever nor chills    Objective    Interim History:   Telemetry:    Objective: Vitals:   06/21/16 0518 06/21/16 0751 06/21/16 1412 06/21/16 1459  BP: (!) 172/93   (!) 154/66  Pulse: 92   91  Resp: 18   20  Temp: 99.3 F (37.4 C)   98.2 F (36.8 C)  TempSrc: Oral   Oral  SpO2: 99% 94% 95% 92%  Weight:      Height:        Intake/Output Summary (Last 24 hours) at 06/21/16 1557 Last data filed at 06/21/16 1234  Gross per 24 hour  Intake              500 ml  Output             2400 ml  Net            -1900 ml    Exam:  Alert obese pleasant oriented no apparent distress S1-S2 no murmur rub  or gallop Chest clinically clear Abdomen is visibly distended  no hepatosplenomegaly No rebound No guarding No adventitious sounds however bowel sounds are scarce   Data Reviewed: Basic Metabolic Panel:  Recent Labs Lab 06/18/16 1117 06/19/16 0543 06/20/16 0607 06/21/16 0546  NA 140 138 136 137  K 3.4* 3.2* 3.1* 4.1  CL 104 104 105 108  CO2 26 24 23 22   GLUCOSE 94 93 106* 80  BUN 12 9 6  5*  CREATININE 1.01* 0.82 0.80 0.77  CALCIUM 8.6* 7.9* 7.8* 7.6*   Liver Function Tests:  Recent Labs Lab 06/18/16 1117 06/20/16 0607 06/21/16 0546  AST 22 19 18   ALT 14 14 14   ALKPHOS 97 82 75  BILITOT 0.7 0.6 0.6  PROT 7.5 6.6 6.5  ALBUMIN 3.6 3.0* 2.9*    Recent Labs Lab 06/18/16 1117  LIPASE 22   No results for input(s): AMMONIA in the last 168 hours. CBC:  Recent Labs Lab 06/18/16 1117 06/19/16 0543 06/20/16 0607 06/21/16 0546  WBC 15.9* 16.1* 15.1* 16.3*  NEUTROABS  --   --  7.3 9.1*  HGB 11.9* 11.5* 11.0* 10.7*  HCT 36.2 35.6* 34.2* 33.2*  MCV 80.6 81.3 80.3 80.8  PLT 312 312 277 290   Cardiac Enzymes: No results for input(s): CKTOTAL, CKMB,  CKMBINDEX, TROPONINI in the last 168 hours. BNP: Invalid input(s): POCBNP CBG:  Recent Labs Lab 06/19/16 1507  GLUCAP 89    No results found for this or any previous visit (from the past 240 hour(s)).   Studies:              All Imaging reviewed and is as per above notation   Scheduled Meds: . albuterol  2.5 mg Nebulization TID  . azelastine  2 spray Each Nare BID  . carvedilol  6.25 mg Oral BID  . docusate sodium  100 mg Oral BID  . fluticasone  2 spray Each Nare Daily  . loratadine  10 mg Oral Daily  . metoCLOPramide (REGLAN) injection  10 mg Intravenous TID WC & HS  . pantoprazole (PROTONIX) IV  40 mg Intravenous BID  . potassium chloride  40 mEq Oral Once  . pravastatin  40 mg Oral q1800  . senna  1 tablet Oral BID  . sertraline  50 mg Oral Daily   Continuous Infusions: . 0.9 % NaCl with KCl  40 mEq / L 125 mL/hr (06/21/16 1256)     Assessment/Plan:  1.  EGD performed on 06/19/2016 shows narrowing of the lower esophagus.  Reglan 10 mg 3 changed to IV-keep NPO and keep the NG tube in situ-still having large OP--gastroenterology to comment on graduation of diet.  CXR acute abd was neg 2. Leukocytosis, Eosinophilic?-there is a question of duodenitis-no antibiotics needed at present,white count is persistently high in the 15 range . There is a question of lymphoma based on the EGD. await biopsies  3. SVT, continue Coreg 6.25 twice a day 4. Reflux-continue Pepcid 20 mg IV every 12, pantoprazole 40 daily 1 5. Bipolar-continue sertraline 50 mg daily Ativan on hold at present time 6. Hypokalemia replace with potassium chloride 40 mEq daily --increased on 06/20/2006 to 40 mEq in the normal saline at 125 cc an hour, repeat labs a.m. 7. Hyperlipidemia-Continue Pravachol 40 every afternoon    in patient still has NG tube in situ and do not think this will resolve in the next 24-48 hours Repeat labs a.m.  Tara Griffes, MD  [facility] Pager [phone] 06/21/2016, 3:57 PM    LOS: 2 days

## 2016-06-21 NOTE — Progress Notes (Signed)
REVIEWED-ADDITIONAL RECOMMENDATIONS-Protonix qacbid REGLAN QACHS.    Subjective: Denies shortness of breath. Coughing up clear phlegm. Feels abdominal distension is better. Abdominal discomfort is better and only discomfort "when someone presses on me". No BM since 9/26. 800 ml NG output documented at 1900 yesterday evening. NG output at 700 ml today at noon.   Objective: Vital signs in last 24 hours: Temp:  [98.8 F (37.1 C)-100.2 F (37.9 C)] 99.3 F (37.4 C) (09/30 0518) Pulse Rate:  [86-95] 92 (09/30 0518) Resp:  [16-20] 18 (09/30 0518) BP: (145-172)/(85-93) 172/93 (09/30 0518) SpO2:  [89 %-99 %] 94 % (09/30 0751) Last BM Date: 06/17/16 General:   Alert and oriented, pleasant Head:  Normocephalic and atraumatic. Eyes:  No icterus, sclera clear. Conjuctiva pink.  Mouth:  Without lesions, mucosa pink and moist.  Lungs: scattered rhonchi, diminished bases.  Abdomen:  Bowel sounds present, distended but soft. No rebound or guarding.  Neurologic:  Alert and  oriented x4 Psych:  Alert and cooperative.   Intake/Output from previous day: 09/29 0701 - 09/30 0700 In: 1179.2 [I.V.:629.2; NG/GT:500; IV Piggyback:50] Out: 1300 [Urine:500; Emesis/NG output:800] Intake/Output this shift: Total I/O In: -  Out: 300 [Urine:300]  Lab Results:  Recent Labs  06/19/16 0543 06/20/16 0607 06/21/16 0546  WBC 16.1* 15.1* 16.3*  HGB 11.5* 11.0* 10.7*  HCT 35.6* 34.2* 33.2*  PLT 312 277 290   BMET  Recent Labs  06/19/16 0543 06/20/16 0607 06/21/16 0546  NA 138 136 137  K 3.2* 3.1* 4.1  CL 104 105 108  CO2 24 23 22   GLUCOSE 93 106* 80  BUN 9 6 5*  CREATININE 0.82 0.80 0.77  CALCIUM 7.9* 7.8* 7.6*   LFT  Recent Labs  06/18/16 1117 06/20/16 0607 06/21/16 0546  PROT 7.5 6.6 6.5  ALBUMIN 3.6 3.0* 2.9*  AST 22 19 18   ALT 14 14 14   ALKPHOS 97 82 75  BILITOT 0.7 0.6 0.6    Assessment: 70 year old female with nausea, vomiting, EGD with abnormal appearance of stomach and  duodenum, pathology bending. Gastric distension with NGT placement to low wall suction, 800 ml output yesterday and 700 thus far since yesterday at 7pm. Clinically, patient feels improved regarding abdominal distension. Needs PPI therapy changed to BID and IV route. Will also change Reglan to IV route. Still awaiting final pathology to sort out further.   Plan: Protonix IV BID Reglan IV QID and at bedtime Keep NGT in place to low wall suction Supportive measures Follow-up on pathology as it comes available Will continue to follow with you Incentive spirometer: dicussed with Scarbro, ANP-BC [facility]    LOS: 2 days    06/21/2016, 9:44 AM

## 2016-06-22 ENCOUNTER — Inpatient Hospital Stay (HOSPITAL_COMMUNITY): Payer: Medicare Other

## 2016-06-22 ENCOUNTER — Encounter: Payer: Self-pay | Admitting: Gastroenterology

## 2016-06-22 LAB — CBC
HEMATOCRIT: 34.9 % — AB (ref 36.0–46.0)
HEMOGLOBIN: 11.3 g/dL — AB (ref 12.0–15.0)
MCH: 26.3 pg (ref 26.0–34.0)
MCHC: 32.4 g/dL (ref 30.0–36.0)
MCV: 81.2 fL (ref 78.0–100.0)
Platelets: 323 10*3/uL (ref 150–400)
RBC: 4.3 MIL/uL (ref 3.87–5.11)
RDW: 15.9 % — ABNORMAL HIGH (ref 11.5–15.5)
WBC: 21.2 10*3/uL — ABNORMAL HIGH (ref 4.0–10.5)

## 2016-06-22 LAB — BASIC METABOLIC PANEL
Anion gap: 5 (ref 5–15)
BUN: 6 mg/dL (ref 6–20)
CHLORIDE: 109 mmol/L (ref 101–111)
CO2: 18 mmol/L — AB (ref 22–32)
Calcium: 7.8 mg/dL — ABNORMAL LOW (ref 8.9–10.3)
Creatinine, Ser: 0.77 mg/dL (ref 0.44–1.00)
GFR calc non Af Amer: >60 mL/min (ref >60)
Glucose, Bld: 82 mg/dL (ref 65–99)
POTASSIUM: 4.3 mmol/L (ref 3.5–5.1)
SODIUM: 132 mmol/L — AB (ref 135–145)

## 2016-06-22 MED ORDER — DOCUSATE SODIUM 100 MG PO CAPS
100.0000 mg | ORAL_CAPSULE | Freq: Two times a day (BID) | ORAL | Status: DC
Start: 2016-06-22 — End: 2016-06-23
  Administered 2016-06-22: 100 mg via ORAL
  Filled 2016-06-22: qty 1

## 2016-06-22 MED ORDER — PIPERACILLIN-TAZOBACTAM 3.375 G IVPB
3.3750 g | Freq: Three times a day (TID) | INTRAVENOUS | Status: DC
  Administered 2016-06-22 (×2): 3.375 g via INTRAVENOUS
  Filled 2016-06-22 (×2): qty 50

## 2016-06-22 MED ORDER — SODIUM CHLORIDE 0.9 % IV SOLN
INTRAVENOUS | Status: DC
  Administered 2016-06-22: 11:00:00 via INTRAVENOUS

## 2016-06-22 NOTE — Progress Notes (Signed)
Tara [name] [id number] DOB: [birth year] DOA: 06/18/2016 PCP: [name], MD  Brief narrative: 70 y/o ? Known history of SVT Hyperlipidemia Nonischemic cardiomyopathy Prior breast cancer status post XRT and chemotherapy Reflux Depression Prior history of colonoscopy 2015-6 polyps. Osteoporosis on bisphosphonates TAH/BSO Patient was admitted overnight 06/18/2016 with nausea vomiting nonbloody no diarrhea no chills nor rigors no fevers. It is noted that the patient does not have a history of diabetes mellitus   Started to have nausea vomiting 2-3 days prior to admission, seemingly associated with certain types of foods, states it happens 2-3 hours after eating, known history of hiatal hernia taking nonsteroidals since June or July after fall onto both knees but only takes about twice a week and Dr. only 2 or 3 tablets. In the emergency room CT scan was performed confirming possible gastroduodenitis and gastroenterology was consulted  The patient ultimately underwent EGD showing bulky and narrowed lower esophagus  Past medical history-As per Problem list Chart reviewed as below- Reviewed  Consultants:  Gastroenterology F 2  Procedures:  Endoscopy 06/19/16  Antibiotics:  no   Subjective   Output seems to be dropping off She is coughing and feels congested CXR done this am not confirming PNA    Objective    Interim History:   Telemetry:    Objective: Vitals:   06/22/16 0300 06/22/16 0500 06/22/16 0654 06/22/16 1411  BP:  (!) 147/71    Pulse:  93    Resp:  18    Temp: 99.6 F (37.6 C) 99.6 F (37.6 C)    TempSrc: Oral Oral    SpO2:  95% 94% 94%  Weight:      Height:        Intake/Output Summary (Last 24 hours) at 06/22/16 1444 Last data filed at 06/22/16 1400  Gross per 24 hour  Intake          2109.17 ml  Output             2750 ml  Net          -640.83 ml    Exam:  Alert obese pleasant oriented no apparent distress S1-S2 no  murmur rub or gallop Chest slightly congested no rales no rhonchi however Abdomen is visibly less distended  no hepatosplenomegaly No rebound No guarding No adventitious sounds however bowel sounds are scarce   Data Reviewed: Basic Metabolic Panel:  Recent Labs Lab 06/18/16 1117 06/19/16 0543 06/20/16 0607 06/21/16 0546 06/22/16 0624  NA 140 138 136 137 132*  K 3.4* 3.2* 3.1* 4.1 4.3  CL 104 104 105 108 109  CO2 26 24 23 22  18*  GLUCOSE 94 93 106* 80 82  BUN 12 9 6  5* 6  CREATININE 1.01* 0.82 0.80 0.77 0.77  CALCIUM 8.6* 7.9* 7.8* 7.6* 7.8*   Liver Function Tests:  Recent Labs Lab 06/18/16 1117 06/20/16 0607 06/21/16 0546  AST 22 19 18   ALT 14 14 14   ALKPHOS 97 82 75  BILITOT 0.7 0.6 0.6  PROT 7.5 6.6 6.5  ALBUMIN 3.6 3.0* 2.9*    Recent Labs Lab 06/18/16 1117  LIPASE 22   No results for input(s): AMMONIA in the last 168 hours. CBC:  Recent Labs Lab 06/18/16 1117 06/19/16 0543 06/20/16 0607 06/21/16 0546 06/22/16 0624  WBC 15.9* 16.1* 15.1* 16.3* 21.2*  NEUTROABS  --   --  7.3 9.1*  --   HGB 11.9* 11.5* 11.0* 10.7* 11.3*  HCT 36.2 35.6* 34.2* 33.2* 34.9*  MCV  80.6 81.3 80.3 80.8 81.2  PLT 312 312 277 290 323   Cardiac Enzymes: No results for input(s): CKTOTAL, CKMB, CKMBINDEX, TROPONINI in the last 168 hours. BNP: Invalid input(s): POCBNP CBG:  Recent Labs Lab 06/19/16 1507  GLUCAP 89    No results found for this or any previous visit (from the past 240 hour(s)).   Studies:              All Imaging reviewed and is as per above notation   Scheduled Meds: . albuterol  2.5 mg Nebulization TID  . azelastine  2 spray Each Nare BID  . carvedilol  6.25 mg Oral BID  . docusate sodium  100 mg Oral BID  . fluticasone  2 spray Each Nare Daily  . loratadine  10 mg Oral Daily  . metoCLOPramide (REGLAN) injection  10 mg Intravenous TID WC & HS  . pantoprazole (PROTONIX) IV  40 mg Intravenous BID  . potassium chloride  40 mEq Oral Once  .  pravastatin  40 mg Oral q1800  . senna  1 tablet Oral BID  . sertraline  50 mg Oral Daily   Continuous Infusions: . sodium chloride 50 mL/hr at 06/22/16 1125     Assessment/Plan:  1.  EGD performed on 06/19/2016 shows narrowing of the lower esophagus.  Reglan 10 mg 3 changed to IV-keep NPO and keep the NG tube in situ-still having OP--gastroenterology to comment on graduation of diet.  CXR acute abd was neg 2. Leukocytosis, Eosinophilic?-there is a question of duodenitis-she is coughing.  Will start Flagyll which offers both intra-abd as well as aspiration coverage as she might have aspirated NG contents. There is a question of lymphoma based on the EGD. await biopsies  3. SVT, continue Coreg 6.25 twice a day-HR controlled reasonably 4. Reflux-continue Pepcid 20 mg IV every 12, pantoprazole 40 daily 1 5. Bipolar-continue sertraline 50 mg daily Ativan on hold at present time 6. Hypokalemia replace with potassium chloride 40 mEq daily --increased on 06/20/2006 to 40 mEq in the normal saline at 125 cc an hour, repeat labs a.m. 7. Hyperlipidemia-Continue Pravachol 40 every afternoon   has NG tube in situ  inpatient  [name], MD  [facility] Pager [phone] 06/22/2016, 2:44 PM    LOS: 3 days

## 2016-06-22 NOTE — Progress Notes (Signed)
Discussed with pathologist on call. Pathology results will likely be available on Monday.   Annitta Needs, ANP-BC [facility]

## 2016-06-22 NOTE — Progress Notes (Signed)
REVIEWED-NO ADDITIONAL RECOMMENDATIONS.   Subjective: Feels like her abdomen is less distended. Gurgling in throat. Coughing up a lot of phlegm. "rough night". Feels "wheezy".   Objective: Vital signs in last 24 hours: Temp:  [98.2 F (36.8 C)-100.9 F (38.3 C)] 99.6 F (37.6 C) (10/01 0500) Pulse Rate:  [90-93] 93 (10/01 0500) Resp:  [16-20] 18 (10/01 0500) BP: (147-154)/(66-73) 147/71 (10/01 0500) SpO2:  [92 %-95 %] 94 % (10/01 0654) Last BM Date: 06/17/16 General:   Resting in bed with eyes closed.  Head:  Normocephalic and atraumatic. Lungs: wheezing noted bilaterally, scattered rhonchi  Abdomen:  Bowel sounds present, still distended but less distension noted than yesterday, no tenderness with palpation, no rebound or guarding Neurologic:  Alert and  oriented x4 Psych:  Alert and cooperative.   Intake/Output from previous day: 09/30 0701 - 10/01 0700 In: 1980 [I.V.:1980] Out: 3450 [Urine:2100; Emesis/NG output:1350] Intake/Output this shift: No intake/output data recorded.  Lab Results:  Recent Labs  06/20/16 0607 06/21/16 0546 06/22/16 0624  WBC 15.1* 16.3* 21.2*  HGB 11.0* 10.7* 11.3*  HCT 34.2* 33.2* 34.9*  PLT 277 290 323   BMET  Recent Labs  06/20/16 0607 06/21/16 0546 06/22/16 0624  NA 136 137 132*  K 3.1* 4.1 4.3  CL 105 108 109  CO2 23 22 18*  GLUCOSE 106* 80 82  BUN 6 5* 6  CREATININE 0.80 0.77 0.77  CALCIUM 7.8* 7.6* 7.8*   LFT  Recent Labs  06/20/16 0607 06/21/16 0546  PROT 6.6 6.5  ALBUMIN 3.0* 2.9*  AST 19 18  ALT 14 14  ALKPHOS 82 75  BILITOT 0.6 0.6    Studies/Results: Dg Abd Acute W/chest  Result Date: 06/21/2016 CLINICAL DATA:  SBO, PATIENT STATES " [place] SINCE Thursday AND HASN'T HAD A BOWL MOVEMENT, HAS ALSO BEEN VOMITING" PATIENT CURRENTLY HAS A NG TUBEHISTORY OF HIATAL HERNIA, CHF, GERD, SVT, CANCER, NONISCHEMIC CARDIOMYOPATHY, DYSTHYMIA, ANXIETY EXAM: DG ABDOMEN ACUTE W/ 1V CHEST COMPARISON:   CT, 06/18/2016.  Chest radiograph, 04/09/2016. FINDINGS: There is no bowel dilation to suggest obstruction. There are several air-fluid levels on the erect view mostly within the right colon. There is no free air. Nasogastric tube curls within the stomach tip projecting in the distal stomach. Cardiac silhouette is mildly enlarged. No mediastinal or hilar masses. Calcified right peritracheal and right hilar lymph nodes are noted. Lungs are clear. No pleural effusion or pneumothorax. There right axillary vascular clips. IMPRESSION: 1. No radiographic evidence of a bowel obstruction.  No free air. 2. No acute cardiopulmonary disease. Electronically Signed   By: Lajean Manes M.D.   On: 06/21/2016 14:22    Assessment: 70 year old female with nausea, vomiting, EGD with abnormal appearance of stomach and duodenum, pathology pending. Gastric distension with NGT placement to low wall suction, with output slowly decreasing each day but still with significant amount (700 during dayshift yesterday and 676ml overnight, prior shift 800) . Clinically, patient feels improved regarding abdominal distension. Events of overnight noted with febrile status (100.9), jump in white count from baseline to 21.2, and physical exam with more evidence of wheezing/rhonchi today. Concern for aspiration. Discussed with nurse Lattie Haw) and with Dr. Verlon Au. IV fluids discontinued. Further management of respiratory status per attending.    Plan: IV fluids discontinued: discussed with Dr. Verlon Au patient's status.  Further management of respiratory status per attending Will attempt to contact pathology to see status of report: it is unclear if this will change management  of patient, but pathology findings may help sort out treatment options going forward Continue Reglan QAC and HS Protonix QAC BID  Will continue to follow with you  Annitta Needs, ANP-BC [facility]     LOS: 3 days    06/22/2016, 8:26 AM

## 2016-06-22 NOTE — Progress Notes (Signed)
Pharmacy Antibiotic Note  Tara [name] is a 70 y.o. female admitted on 06/18/2016 with aspiration pneumonia.  Pharmacy has been consulted for Zosyn dosing.  Plan: Zosyn 3.375g IV q8h (4 hour infusion). Labs per protocon  Height: 4\' 11"  (149.9 cm) Weight: 184 lb 15.5 oz (83.9 kg) IBW/kg (Calculated) : 43.2  Temp (24hrs), Avg:99.8 F (37.7 C), Min:98.2 F (36.8 C), Max:100.9 F (38.3 C)   Recent Labs Lab 06/18/16 1117 06/19/16 0543 06/20/16 0607 06/21/16 0546 06/22/16 0624  WBC 15.9* 16.1* 15.1* 16.3* 21.2*  CREATININE 1.01* 0.82 0.80 0.77 0.77    Estimated Creatinine Clearance: 61.5 mL/min (by C-G formula based on SCr of 0.77 mg/dL).    Allergies  Allergen Reactions  . Aspirin Other (See Comments)    Reports GI bleed with daily use  . Lisinopril Cough  . Sudafed [Pseudoephedrine Hcl]     Pt reports she had drainage in throat that made her throat hurt.     Antimicrobials this admission: Zosyn 10/1 >>    Dose adjustments this admission:    Thank you for allowing pharmacy to be a part of this patient's care.  Chriss Czar 06/22/2016 2:49 PM

## 2016-06-22 NOTE — Progress Notes (Addendum)
Suspect most of sounds herd in lung fields are from upper airway bronchitis like, with secretions from ng tube  Irritation ; should clear with tube removal. Hopefully.

## 2016-06-23 ENCOUNTER — Encounter (HOSPITAL_COMMUNITY): Payer: Self-pay | Admitting: Internal Medicine

## 2016-06-23 DIAGNOSIS — K56609 Unspecified intestinal obstruction, unspecified as to partial versus complete obstruction: Secondary | ICD-10-CM

## 2016-06-23 LAB — BASIC METABOLIC PANEL
Anion gap: 12 (ref 5–15)
BUN: 11 mg/dL (ref 6–20)
CALCIUM: 8.3 mg/dL — AB (ref 8.9–10.3)
CO2: 19 mmol/L — ABNORMAL LOW (ref 22–32)
CREATININE: 0.92 mg/dL (ref 0.44–1.00)
Chloride: 102 mmol/L (ref 101–111)
GFR calc Af Amer: >60 mL/min (ref >60)
GLUCOSE: 104 mg/dL — AB (ref 65–99)
POTASSIUM: 3.6 mmol/L (ref 3.5–5.1)
SODIUM: 133 mmol/L — AB (ref 135–145)

## 2016-06-23 LAB — CBC WITH DIFFERENTIAL/PLATELET
Basophils Absolute: 0 10*3/uL (ref 0.0–0.1)
Basophils Relative: 0 %
EOS ABS: 1.8 10*3/uL — AB (ref 0.0–0.7)
EOS PCT: 8 %
HCT: 33.4 % — ABNORMAL LOW (ref 36.0–46.0)
Hemoglobin: 11 g/dL — ABNORMAL LOW (ref 12.0–15.0)
Lymphocytes Relative: 8 %
Lymphs Abs: 1.8 10*3/uL (ref 0.7–4.0)
MCH: 26.3 pg (ref 26.0–34.0)
MCHC: 32.9 g/dL (ref 30.0–36.0)
MCV: 79.9 fL (ref 78.0–100.0)
MONO ABS: 1.3 10*3/uL — AB (ref 0.1–1.0)
Monocytes Relative: 6 %
NEUTROS PCT: 78 %
Neutro Abs: 17.8 10*3/uL — ABNORMAL HIGH (ref 1.7–7.7)
PLATELETS: 316 10*3/uL (ref 150–400)
RBC: 4.18 MIL/uL (ref 3.87–5.11)
RDW: 16 % — AB (ref 11.5–15.5)
WBC: 22.7 10*3/uL — AB (ref 4.0–10.5)

## 2016-06-23 MED ORDER — CLARITHROMYCIN 500 MG PO TABS: 500.0000 mg | ORAL_TABLET | Freq: Two times a day (BID) | ORAL | Status: DC

## 2016-06-23 MED ORDER — AMOXICILLIN 250 MG PO CAPS: 1000.0000 mg | ORAL_CAPSULE | Freq: Two times a day (BID) | ORAL | Status: DC

## 2016-06-23 MED ORDER — CLINDAMYCIN HCL 150 MG PO CAPS: 300.0000 mg | ORAL_CAPSULE | Freq: Three times a day (TID) | ORAL | Status: DC

## 2016-06-23 MED ORDER — ENSURE ENLIVE PO LIQD
237.0000 mL | Freq: Three times a day (TID) | ORAL | Status: DC
  Administered 2016-06-23: 237 mL via ORAL

## 2016-06-23 MED ORDER — CLINDAMYCIN HCL 150 MG PO CAPS
300.0000 mg | ORAL_CAPSULE | Freq: Three times a day (TID) | ORAL | Status: DC
  Administered 2016-06-23 – 2016-06-24 (×2): 300 mg via ORAL
  Filled 2016-06-23 (×2): qty 2

## 2016-06-23 MED ORDER — PANTOPRAZOLE SODIUM 40 MG PO TBEC
40.0000 mg | DELAYED_RELEASE_TABLET | Freq: Two times a day (BID) | ORAL | Status: DC
  Administered 2016-06-23 – 2016-06-26 (×7): 40 mg via ORAL
  Filled 2016-06-23 (×7): qty 1

## 2016-06-23 MED ORDER — ONDANSETRON 4 MG PO TBDP: 4.0000 mg | ORAL_TABLET | Freq: Three times a day (TID) | ORAL | Status: DC | PRN

## 2016-06-23 MED ORDER — CLINDAMYCIN HCL 150 MG PO CAPS
300.0000 mg | ORAL_CAPSULE | Freq: Three times a day (TID) | ORAL | Status: DC
  Administered 2016-06-23: 300 mg via ORAL
  Filled 2016-06-23: qty 2

## 2016-06-23 NOTE — Progress Notes (Signed)
Pathology report is back. 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 Pylori, no intestinal metaplasia, dysplasia, or malignancy.  REVIEWED-NO ADDITIONAL RECOMMENDATIONS.

## 2016-06-23 NOTE — Care Management Important Message (Signed)
Important Message  Patient Details  Name: Tara [name] MRN: [id number] Date of Birth: [birth year]   Medicare Important Message Given:  Yes    Sherald Barge, RN 06/23/2016, 11:32 AM

## 2016-06-23 NOTE — Progress Notes (Signed)
Tara [name] [id number] DOB: Jul 08, 1946 DOA: 06/18/2016 PCP: [name], MD  Brief narrative: 70 y/o ? Known history of SVT Hyperlipidemia Nonischemic cardiomyopathy Prior breast cancer status post XRT and chemotherapy Reflux Depression Prior history of colonoscopy 2015-6 polyps. Osteoporosis on bisphosphonates TAH/BSO Patient was admitted overnight 06/18/2016 with nausea vomiting nonbloody no diarrhea no chills nor rigors no fevers. It is noted that the patient does not have a history of diabetes mellitus   Started to have nausea vomiting 2-3 days prior to admission, seemingly associated with certain types of foods, states it happens 2-3 hours after eating, known history of hiatal hernia taking nonsteroidals since June or July after fall onto both knees but only takes about twice a week and Dr. only 2 or 3 tablets. In the emergency room CT scan was performed confirming possible gastroduodenitis and gastroenterology was consulted  The patient ultimately underwent EGD showing bulky and narrowed lower esophagus  Past medical history-As per Problem list Chart reviewed as below- Reviewed  Consultants:  Gastroenterology F 2  Procedures:  Endoscopy 06/19/16  Antibiotics:  no   Subjective   Output seems to be dropping off to some extent Long discussion with the patient regarding options as may need PICC line but given output is dropping or may not need long and would hesitate but alignment in if only to use for one or 2 days Ultimately patient is okay with trying to get meds by NG and clamping the same     Objective    Interim History:   Telemetry:    Objective: Vitals:   06/22/16 2133 06/23/16 0039 06/23/16 0454 06/23/16 0737  BP: (!) 150/77  118/64   Pulse: (!) 105  89   Resp: 20  (!) 24   Temp: 99.6 F (37.6 C)  98.2 F (36.8 C)   TempSrc: Oral  Oral   SpO2: 97% 91% 94% 94%  Weight:      Height:        Intake/Output Summary (Last 24  hours) at 06/23/16 0920 Last data filed at 06/23/16 0600  Gross per 24 hour  Intake           379.17 ml  Output             1850 ml  Net         -1470.83 ml    Exam:  Alert obese pleasant oriented no apparent distress, NG tube in place S1-S2 no murmur rub or gallop Chest slightly congestedWith transmitted lung sounds Abdomen is visibly less distended  no hepatosplenomegaly No rebound No guarding No adventitious sounds however bowel sounds are scarce   Data Reviewed: Basic Metabolic Panel:  Recent Labs Lab 06/19/16 0543 06/20/16 0607 06/21/16 0546 06/22/16 0624 06/23/16 0600  NA 138 136 137 132* 133*  K 3.2* 3.1* 4.1 4.3 3.6  CL 104 105 108 109 102  CO2 24 23 22  18* 19*  GLUCOSE 93 106* 80 82 104*  BUN 9 6 5* 6 11  CREATININE 0.82 0.80 0.77 0.77 0.92  CALCIUM 7.9* 7.8* 7.6* 7.8* 8.3*   Liver Function Tests:  Recent Labs Lab 06/18/16 1117 06/20/16 0607 06/21/16 0546  AST 22 19 18   ALT 14 14 14   ALKPHOS 97 82 75  BILITOT 0.7 0.6 0.6  PROT 7.5 6.6 6.5  ALBUMIN 3.6 3.0* 2.9*    Recent Labs Lab 06/18/16 1117  LIPASE 22   No results for input(s): AMMONIA in the last 168 hours. CBC:  Recent Labs  Lab 06/19/16 0543 06/20/16 [id number] 06/21/16 0546 06/22/16 0624 06/23/16 0600  WBC 16.1* 15.1* 16.3* 21.2* 22.7*  NEUTROABS  --  7.3 9.1*  --  17.8*  HGB 11.5* 11.0* 10.7* 11.3* 11.0*  HCT 35.6* 34.2* 33.2* 34.9* 33.4*  MCV 81.3 80.3 80.8 81.2 79.9  PLT 312 277 290 323 316   Cardiac Enzymes: No results for input(s): CKTOTAL, CKMB, CKMBINDEX, TROPONINI in the last 168 hours. BNP: Invalid input(s): POCBNP CBG:  Recent Labs Lab 06/19/16 1507  GLUCAP 89    No results found for this or any previous visit (from the past 240 hour(s)).   Studies:              All Imaging reviewed and is as per above notation   Scheduled Meds: . albuterol  2.5 mg Nebulization TID  . azelastine  2 spray Each Nare BID  . carvedilol  6.25 mg Oral BID  . clindamycin  300  mg Oral Q8H  . fluticasone  2 spray Each Nare Daily  . pantoprazole  40 mg Oral BID  . potassium chloride  40 mEq Oral Once  . senna  1 tablet Oral BID  . sertraline  50 mg Oral Daily   Continuous Infusions:     Assessment/Plan:  1.  EGD performed on 06/19/2016 shows narrowing of the lower esophagus.  Reglan 10 mg 3 Change to by mouth NPO and keep the NG tube in situ 2. Leukocytosis, Eosinophilic versus aspiration pneumonia.?-there is a question of duodenitis-she is coughing.  Transitioned from IV antibiotics to by mouth clindamycin 06/23/2016 which offers both intra-abd as well as aspiration coverage as she might have aspirated NG contents. There is a question of lymphoma based on the EGD. await biopsies which are still pending as of 06/23/2016  3. SVT, continue Coreg 6.25 twice a day-HR controlled reasonably 4. Reflux-continue, pantoprazole 40 by mouth with clamping of NGT 5. Bipolar-continue sertraline 50 mg daily Ativan on hold at present time 6. Hypokalemia replace with potassium chloride 40 mEq dresolved so hold off on IV fluid for now.  And attempt to monitor repeat labs in a.m. if needed we will need to place PICC line but I have asked nursing to see how the patient doesn't lunchtime and we can go from there    Awaiting resolution of nausea vomiting States inpatient Unclear disposition or day of discharge is   Verlon Au, MD  [facility] Pager [phone] 06/23/2016, 9:20 AM    LOS: 4 days

## 2016-06-23 NOTE — Progress Notes (Signed)
Pt had lost IV access and upon shift change, multiple attempts had been tried and unsuccessful.  Paged Dr Verlon Au, order for PICC insertion placed.  Pt signed consent, on chart.  [facility] called.

## 2016-06-23 NOTE — Progress Notes (Signed)
Subjective: Breathing has been poor sicne Saturday/worse Saturday night. Abdomen feels less distended to the patient. Is wondering when NG tub will come out, asking about pathology results.  Objective: Vital signs in last 24 hours: Temp:  [98.2 F (36.8 C)-99.6 F (37.6 C)] 98.2 F (36.8 C) (10/02 0454) Pulse Rate:  [88-105] 89 (10/02 0454) Resp:  [20-24] 24 (10/02 0454) BP: (118-150)/(64-77) 118/64 (10/02 0454) SpO2:  [90 %-97 %] 94 % (10/02 0737) Last BM Date: 06/17/16 General:   Alert and oriented, pleasant Head:  Normocephalic and atraumatic. Eyes:  No icterus, sclera clear. Conjuctiva pink.  Nose: NGT noted left nare, minimal drainage in the cannister, tubing with significant drainge, hooked up to wall suction Heart:  S1, S2 present, no murmurs noted.  Lungs: Bilateral rhonchi throughout.  Abdomen:  Bowel sounds present, soft, non-tender, distended but less so than last week. No HSM or hernias noted. No rebound or guarding. Msk:  Symmetrical without gross deformities. Pulses:  Normal bilateral DP pulses noted. Extremities:  Without clubbing or edema. Neurologic:  Alert and  oriented x4;  grossly normal neurologically.  Psych:  Alert and cooperative. Normal mood and affect.  Intake/Output from previous day: 10/01 0701 - 10/02 0700 In: 379.2 [I.V.:329.2; IV Piggyback:50] Out: 2150 [Urine:1450; Emesis/NG output:700] Intake/Output this shift: No intake/output data recorded.  Lab Results:  Recent Labs  06/21/16 0546 06/22/16 0624 06/23/16 0600  WBC 16.3* 21.2* 22.7*  HGB 10.7* 11.3* 11.0*  HCT 33.2* 34.9* 33.4*  PLT 290 323 316   BMET  Recent Labs  06/21/16 0546 06/22/16 0624 06/23/16 0600  NA 137 132* 133*  K 4.1 4.3 3.6  CL 108 109 102  CO2 22 18* 19*  GLUCOSE 80 82 104*  BUN 5* 6 11  CREATININE 0.77 0.77 0.92  CALCIUM 7.6* 7.8* 8.3*   LFT  Recent Labs  06/21/16 0546  PROT 6.5  ALBUMIN 2.9*  AST 18  ALT 14  ALKPHOS 75  BILITOT 0.6    PT/INR No results for input(s): LABPROT, INR in the last 72 hours. Hepatitis Panel No results for input(s): HEPBSAG, HCVAB, HEPAIGM, HEPBIGM in the last 72 hours.   Studies/Results: Dg Chest 2 View  Result Date: 06/22/2016 CLINICAL DATA:  Increased productive cough since yesterday. EXAM: CHEST  2 VIEW COMPARISON:  06/21/2016 FINDINGS: Cardiac silhouette is mildly enlarged. No mediastinal or hilar masses or evidence of adenopathy. There are calcified right paratracheal right hilar lymph nodes. Streaky opacity in the medial lung bases, left greater than right, is likely chronic bronchitic change. This is stable. Lungs otherwise clear. There is a small hiatal hernia. No pleural effusion or pneumothorax. Nasogastric tube curls within the stomach. Bony thorax is demineralized but grossly intact. There stable changes from prior right breast surgery. IMPRESSION: 1. No convincing acute cardiopulmonary disease. Stable appearance from the previous day's study. 2. Chronic bronchitic change in the lung bases. 3. NG tube well positioned. 4. Mild cardiomegaly. Electronically Signed   By: Lajean Manes M.D.   On: 06/22/2016 13:14   Dg Abd Acute W/chest  Result Date: 06/21/2016 CLINICAL DATA:  SBO, PATIENT STATES " [place] SINCE Thursday AND HASN'T HAD A BOWL MOVEMENT, HAS ALSO BEEN VOMITING" PATIENT CURRENTLY HAS A NG TUBEHISTORY OF HIATAL HERNIA, CHF, GERD, SVT, CANCER, NONISCHEMIC CARDIOMYOPATHY, DYSTHYMIA, ANXIETY EXAM: DG ABDOMEN ACUTE W/ 1V CHEST COMPARISON:  CT, 06/18/2016.  Chest radiograph, 04/09/2016. FINDINGS: There is no bowel dilation to suggest obstruction. There are several air-fluid  levels on the erect view mostly within the right colon. There is no free air. Nasogastric tube curls within the stomach tip projecting in the distal stomach. Cardiac silhouette is mildly enlarged. No mediastinal or hilar masses. Calcified right peritracheal and right hilar lymph nodes are noted.  Lungs are clear. No pleural effusion or pneumothorax. There right axillary vascular clips. IMPRESSION: 1. No radiographic evidence of a bowel obstruction.  No free air. 2. No acute cardiopulmonary disease. Electronically Signed   By: Lajean Manes M.D.   On: 06/21/2016 14:22    Assessment: 70 year old female with nausea, vomiting, EGD with abnormal appearance of stomach and duodenum, pathology pending, though expected potentially today. Improvement in temperature last 24 hours (TMax 99.6). Leucocytosis worsened this morning (22.7 from 21.2 yesterday) with complaints of cough starting in the past 1-2 days. Chest XRay with chronic bronchitic changes and NG Tube well positioned, no indication of pneumonia. Patient has been started on Zosyn yesterday.   Abdomen less distended today. NGT remains in place to low intermittent suction. Breathing worse then last Friday, apparently has been consistent like this since Saturday. No IV access, awaiting replacement for medications (including antibiotics). Had a liquid bowel movement, minimal, no heme noted. No N/V, abdominal pain.   Plan: 1. Continued Reglan and Protonix 2. Will follow-up on pathology results when they're available 3. Supportive measures 4. Respiratory treatment per hospitalist   Thank you for allowing [place] to participate in the care of [place], DNP, AGNP-C Adult & Gerontological Nurse Practitioner [facility]     LOS: 4 days    06/23/2016, 8:29 AM

## 2016-06-23 NOTE — Progress Notes (Signed)
Patients NGT removed - tolerated well.  Given full liquids for dinner.

## 2016-06-24 LAB — BASIC METABOLIC PANEL
Anion gap: 10 (ref 5–15)
BUN: 21 mg/dL — ABNORMAL HIGH (ref 6–20)
CHLORIDE: 104 mmol/L (ref 101–111)
CO2: 23 mmol/L (ref 22–32)
CREATININE: 1.06 mg/dL — AB (ref 0.44–1.00)
Calcium: 8.6 mg/dL — ABNORMAL LOW (ref 8.9–10.3)
GFR calc Af Amer: >60 mL/min (ref >60)
GFR calc non Af Amer: 52 mL/min — ABNORMAL LOW (ref >60)
GLUCOSE: 106 mg/dL — AB (ref 65–99)
POTASSIUM: 3.6 mmol/L (ref 3.5–5.1)
Sodium: 137 mmol/L (ref 135–145)

## 2016-06-24 LAB — CBC WITH DIFFERENTIAL/PLATELET
Basophils Absolute: 0 10*3/uL (ref 0.0–0.1)
Basophils Relative: 0 %
Eosinophils Absolute: 4.7 10*3/uL — ABNORMAL HIGH (ref 0.0–0.7)
Eosinophils Relative: 25 %
HEMATOCRIT: 32.9 % — AB (ref 36.0–46.0)
HEMOGLOBIN: 11.1 g/dL — AB (ref 12.0–15.0)
LYMPHS ABS: 2 10*3/uL (ref 0.7–4.0)
Lymphocytes Relative: 11 %
MCH: 26.4 pg (ref 26.0–34.0)
MCHC: 33.7 g/dL (ref 30.0–36.0)
MCV: 78.3 fL (ref 78.0–100.0)
MONOS PCT: 6 %
Monocytes Absolute: 1 10*3/uL (ref 0.1–1.0)
NEUTROS ABS: 11 10*3/uL — AB (ref 1.7–7.7)
NEUTROS PCT: 58 %
Platelets: 305 10*3/uL (ref 150–400)
RBC: 4.2 MIL/uL (ref 3.87–5.11)
RDW: 15.8 % — ABNORMAL HIGH (ref 11.5–15.5)
WBC: 18.7 10*3/uL — ABNORMAL HIGH (ref 4.0–10.5)

## 2016-06-24 LAB — C DIFFICILE QUICK SCREEN W PCR REFLEX
C Diff antigen: NEGATIVE
C Diff interpretation: NOT DETECTED
C Diff toxin: NEGATIVE

## 2016-06-24 MED ORDER — BISMUTH SUBSALICYLATE 262 MG/15ML PO SUSP: 30.0000 mL | Freq: Four times a day (QID) | ORAL | 0 refills | Status: DC | PRN

## 2016-06-24 MED ORDER — BISMUTH SUBSALICYLATE 262 MG/15ML PO SUSP
30.0000 mL | Freq: Four times a day (QID) | ORAL | Status: DC | PRN
  Filled 2016-06-24: qty 118

## 2016-06-24 MED ORDER — LOPERAMIDE HCL 2 MG PO CAPS: 2.0000 mg | ORAL_CAPSULE | Freq: Four times a day (QID) | ORAL | 0 refills | Status: DC | PRN

## 2016-06-24 MED ORDER — LOPERAMIDE HCL 2 MG PO CAPS
2.0000 mg | ORAL_CAPSULE | ORAL | Status: AC
  Administered 2016-06-24: 2 mg via ORAL
  Filled 2016-06-24: qty 1

## 2016-06-24 MED ORDER — LOPERAMIDE HCL 2 MG PO CAPS: 2.0000 mg | ORAL_CAPSULE | Freq: Four times a day (QID) | ORAL | Status: DC | PRN

## 2016-06-24 NOTE — Progress Notes (Signed)
Tara [name] [id number] DOB: 1946/08/31 DOA: 06/18/2016 PCP: [name], MD  Brief narrative: 70 y/o ? Known history of SVT Hyperlipidemia Nonischemic cardiomyopathy Prior breast cancer status post XRT and chemotherapy Reflux Depression Prior history of colonoscopy 2015-6 polyps. Osteoporosis on bisphosphonates TAH/BSO Patient was admitted overnight 06/18/2016 with nausea vomiting nonbloody no diarrhea no chills nor rigors no fevers. It is noted that the patient does not have a history of diabetes mellitus   Started to have nausea vomiting 2-3 days prior to admission, seemingly associated with certain types of foods, states it happens 2-3 hours after eating, known history of hiatal hernia taking nonsteroidals since June or July after fall onto both knees but only takes about twice a week and Dr. only 2 or 3 tablets. In the emergency room CT scan was performed confirming possible gastroduodenitis and gastroenterology was consulted  The patient ultimately underwent EGD showing bulky and narrowed lower esophagus  Past medical history-As per Problem list Chart reviewed as below- Reviewed  Consultants:  Gastroenterology F 2  Procedures:  Endoscopy 06/19/16  Antibiotics:  no   Subjective   Multiple stools reported on liquid diet Also taking multipl doses colace and senna Feels overall better No cp Had grits, milk, ice cream etc and kept it all down Aware will eventually need EUS   Objective    Interim History:   Telemetry:    Objective: Vitals:   06/23/16 1959 06/23/16 2119 06/24/16 0445 06/24/16 0730  BP:  119/61 121/63   Pulse:  89 89   Resp:  (!) 22 (!) 22   Temp:  98.9 F (37.2 C) 98.7 F (37.1 C)   TempSrc:  Oral Oral   SpO2: 94% 95% 95% 90%  Weight:      Height:        Intake/Output Summary (Last 24 hours) at 06/24/16 0953 Last data filed at 06/23/16 2130  Gross per 24 hour  Intake              237 ml  Output              200  ml  Net               37 ml    Exam:  Alert obese pleasant oriented no apparent distress, NG tube in place S1-S2 no murmur rub or gallop Chest slightly congestedWith transmitted lung sounds Abdomen a little more distended, but has just eaten.  no hepatosplenomegaly No rebound No guarding No adventitious sounds however bowel sounds are scarce   Data Reviewed: Basic Metabolic Panel:  Recent Labs Lab 06/20/16 0607 06/21/16 0546 06/22/16 0624 06/23/16 0600 06/24/16 0823  NA 136 137 132* 133* 137  K 3.1* 4.1 4.3 3.6 3.6  CL 105 108 109 102 104  CO2 23 22 18* 19* 23  GLUCOSE 106* 80 82 104* 106*  BUN 6 5* 6 11 21*  CREATININE 0.80 0.77 0.77 0.92 1.06*  CALCIUM 7.8* 7.6* 7.8* 8.3* 8.6*   Liver Function Tests:  Recent Labs Lab 06/18/16 1117 06/20/16 0607 06/21/16 0546  AST 22 19 18   ALT 14 14 14   ALKPHOS 97 82 75  BILITOT 0.7 0.6 0.6  PROT 7.5 6.6 6.5  ALBUMIN 3.6 3.0* 2.9*    Recent Labs Lab 06/18/16 1117  LIPASE 22   No results for input(s): AMMONIA in the last 168 hours. CBC:  Recent Labs Lab 06/20/16 0607 06/21/16 0546 06/22/16 0624 06/23/16 0600 06/24/16 0823  WBC 15.1* 16.3* 21.2*  22.7* 18.7*  NEUTROABS 7.3 9.1*  --  17.8* 11.0*  HGB 11.0* 10.7* 11.3* 11.0* 11.1*  HCT 34.2* 33.2* 34.9* 33.4* 32.9*  MCV 80.3 80.8 81.2 79.9 78.3  PLT 277 290 323 316 305   Cardiac Enzymes: No results for input(s): CKTOTAL, CKMB, CKMBINDEX, TROPONINI in the last 168 hours. BNP: Invalid input(s): POCBNP CBG:  Recent Labs Lab 06/19/16 1507  GLUCAP 89    No results found for this or any previous visit (from the past 240 hour(s)).   Studies:              All Imaging reviewed and is as per above notation   Scheduled Meds: . albuterol  2.5 mg Nebulization TID  . azelastine  2 spray Each Nare BID  . carvedilol  6.25 mg Oral BID  . clindamycin  300 mg Oral Q8H  . feeding supplement (ENSURE ENLIVE)  237 mL Oral TID BM  . fluticasone  2 spray Each Nare  Daily  . pantoprazole  40 mg Oral BID  . potassium chloride  40 mEq Oral Once  . senna  1 tablet Oral BID  . sertraline  50 mg Oral Daily   Continuous Infusions:     Assessment/Plan:  1.  EGD performed on 06/19/2016 shows narrowing of the lower esophagus.  Reglan 10 mg 3 Change to by mouth NPO and keep the NG tube in situ 2. R/o cdiff-nursing aware to let us know result 3. Leukocytosis, Eosinophilic versus aspiration pneumonia.?-there is a question of duodenitis/could have asp[irated with NG-t in situ till 10/2-  IV antibiotics --PO 10/02/2017Clindamycin --may also be casuing infection.--stop clinda 10/3, although clinda implicated with CDIFF.   Biopsy just showed duodenitis 4. SVT, continue Coreg 6.25 twice a day-HR controlled reasonably 5. Reflux-continue, pantoprazole 40 by mouth with clamping of NGT 6. Bipolar-continue sertraline 50 mg daily Ativan on hold at present time 7. Hypokalemia replacing 40 meq daily and has resolved   States inpatient Await Cdiff and monitor  Bassy Fetterly, MD  [facility] Pager [phone] 06/24/2016, 9:53 AM    LOS: 5 days

## 2016-06-24 NOTE — Progress Notes (Signed)
Subjective: Feels somewhat better today. Thinks breathing is somewhat improved. Tolerating full liquids without N/V or abdominal pain. Happy to be eating again. Has had 4-5 loose stools in the past 24 hours with fecal urgency and "accidents" in the bed. Feels abdomen is less distended today.  Objective: Vital signs in last 24 hours: Temp:  [98.7 F (37.1 C)-99.2 F (37.3 C)] 98.7 F (37.1 C) (10/03 0445) Pulse Rate:  [89-90] 89 (10/03 0445) Resp:  [20-22] 22 (10/03 0445) BP: (119-125)/(54-63) 121/63 (10/03 0445) SpO2:  [90 %-96 %] 90 % (10/03 0730) Last BM Date: 06/23/16 (loose stool) General:   Alert and oriented, pleasant, resting comfortably in the bed. Eyes:  No icterus, sclera clear. Conjuctiva pink.  Heart:  S1, S2 present, no murmurs noted.  Lungs: Bilateral rhonchi on auscultation; Not as audible without stethoscope as yesterday. No respiratory distress.  Abdomen:  Bowel sounds present/hyperactive, soft, non-tender, remains somewhat distended. No HSM or hernias noted. No rebound or guarding. Pulses:  Normal bilateral LE pulses noted. Extremities:  Without clubbing or edema. Neurologic:  Alert and  oriented x4;  grossly normal neurologically. Psych:  Alert and cooperative. Normal mood and affect.  Intake/Output from previous day: 10/02 0701 - 10/03 0700 In: 237 [P.O.:237] Out: 450 [Urine:450] Intake/Output this shift: No intake/output data recorded.  Lab Results:  Recent Labs  06/22/16 0624 06/23/16 0600 06/24/16 0823  WBC 21.2* 22.7* 18.7*  HGB 11.3* 11.0* 11.1*  HCT 34.9* 33.4* 32.9*  PLT 323 316 305   BMET  Recent Labs  06/22/16 0624 06/23/16 0600 06/24/16 0823  NA 132* 133* 137  K 4.3 3.6 3.6  CL 109 102 104  CO2 18* 19* 23  GLUCOSE 82 104* 106*  BUN 6 11 21*  CREATININE 0.77 0.92 1.06*  CALCIUM 7.8* 8.3* 8.6*   LFT No results for input(s): PROT, ALBUMIN, AST, ALT, ALKPHOS, BILITOT, BILIDIR, IBILI in the last 72 hours. PT/INR No results  for input(s): LABPROT, INR in the last 72 hours. Hepatitis Panel No results for input(s): HEPBSAG, HCVAB, HEPAIGM, HEPBIGM in the last 72 hours.   Studies/Results: Dg Chest 2 View  Result Date: 06/22/2016 CLINICAL DATA:  Increased productive cough since yesterday. EXAM: CHEST  2 VIEW COMPARISON:  06/21/2016 FINDINGS: Cardiac silhouette is mildly enlarged. No mediastinal or hilar masses or evidence of adenopathy. There are calcified right paratracheal right hilar lymph nodes. Streaky opacity in the medial lung bases, left greater than right, is likely chronic bronchitic change. This is stable. Lungs otherwise clear. There is a small hiatal hernia. No pleural effusion or pneumothorax. Nasogastric tube curls within the stomach. Bony thorax is demineralized but grossly intact. There stable changes from prior right breast surgery. IMPRESSION: 1. No convincing acute cardiopulmonary disease. Stable appearance from the previous day's study. 2. Chronic bronchitic change in the lung bases. 3. NG tube well positioned. 4. Mild cardiomegaly. Electronically Signed   By: Lajean Manes M.D.   On: 06/22/2016 13:14    Assessment: 70 year old female with nausea, vomiting, EGD with abnormal appearance of stomach and duodenum, pathology pending, though expected potentially today. Improvement in temperature last 24 hours (TMax 99.6). Leucocytosis worsened this morning (22.7 from 21.2 yesterday) with complaints of cough starting in the past 1-2 days. Chest XRay with chronic bronchitic changes and NG Tube well positioned, no indication of pneumonia. Patient on Zosyn.   Continues to clinically improve slowly. Abdomen less distended today. NGT removed yesterday.. Breathing seems improved over yesterday on physical exam, less audible.  Has had 4-5 loose stools in the past 24 hours, no heme noted. No N/V, abdominal pain.  Labs today show some increase in creatinine, calcium improved. CBC with stable hgb, significant improvement  in leucocytosis to 18.7 today (22.7 yesterday).  Loose stools likely ADE from antibiotics (Zosyn) but cannot rule out infection such as C. Diff.   Plan: 1. Discussed loose stools with nurse, will have C. Diff stool test completed. 2. Continue full liquids for now, Ensure supplement 3. Will plan for EUS as outpatient in [place] 4. Monitor for GI bleed 5. Supportive measures.   Thank you for allowing [place] to participate in the care of [place], DNP, AGNP-C Adult & Gerontological Nurse Practitioner [facility]     LOS: 5 days    06/24/2016, 9:35 AM

## 2016-06-24 NOTE — Care Management Note (Signed)
Case Management Note  Patient Details  Name: Tara [name] MRN: [id number] Date of Birth: Mar 19, 1946   If discussed at Long Length of Stay Meetings, dates discussed:  06/24/2016   Sherald Barge, RN 06/24/2016, 1:20 PM

## 2016-06-25 DIAGNOSIS — K2900 Acute gastritis without bleeding: Secondary | ICD-10-CM

## 2016-06-25 LAB — CBC WITH DIFFERENTIAL/PLATELET
BASOS PCT: 0 %
Basophils Absolute: 0 10*3/uL (ref 0.0–0.1)
EOS ABS: 6.3 10*3/uL — AB (ref 0.0–0.7)
Eosinophils Relative: 40 %
HCT: 32.6 % — ABNORMAL LOW (ref 36.0–46.0)
HEMOGLOBIN: 10.9 g/dL — AB (ref 12.0–15.0)
LYMPHS ABS: 2.2 10*3/uL (ref 0.7–4.0)
Lymphocytes Relative: 14 %
MCH: 26.1 pg (ref 26.0–34.0)
MCHC: 33.4 g/dL (ref 30.0–36.0)
MCV: 78.2 fL (ref 78.0–100.0)
MONO ABS: 0.8 10*3/uL (ref 0.1–1.0)
Monocytes Relative: 5 %
NEUTROS PCT: 41 %
Neutro Abs: 6.5 10*3/uL (ref 1.7–7.7)
PLATELETS: 359 10*3/uL (ref 150–400)
RBC: 4.17 MIL/uL (ref 3.87–5.11)
RDW: 16 % — AB (ref 11.5–15.5)
WBC: 15.8 10*3/uL — ABNORMAL HIGH (ref 4.0–10.5)

## 2016-06-25 LAB — BASIC METABOLIC PANEL
Anion gap: 9 (ref 5–15)
BUN: 21 mg/dL — AB (ref 6–20)
CALCIUM: 8.4 mg/dL — AB (ref 8.9–10.3)
CHLORIDE: 104 mmol/L (ref 101–111)
CO2: 23 mmol/L (ref 22–32)
CREATININE: 0.92 mg/dL (ref 0.44–1.00)
Glucose, Bld: 99 mg/dL (ref 65–99)
Potassium: 3 mmol/L — ABNORMAL LOW (ref 3.5–5.1)
SODIUM: 136 mmol/L (ref 135–145)

## 2016-06-25 LAB — MAGNESIUM: MAGNESIUM: 1.9 mg/dL (ref 1.7–2.4)

## 2016-06-25 MED ORDER — POTASSIUM CHLORIDE CRYS ER 20 MEQ PO TBCR: 40.0000 meq | EXTENDED_RELEASE_TABLET | Freq: Four times a day (QID) | ORAL | Status: DC

## 2016-06-25 MED ORDER — POTASSIUM CHLORIDE 20 MEQ/15ML (10%) PO SOLN
40.0000 meq | Freq: Once | ORAL | Status: AC
  Administered 2016-06-25: 40 meq via ORAL
  Filled 2016-06-25: qty 30

## 2016-06-25 MED ORDER — ALBUTEROL SULFATE (2.5 MG/3ML) 0.083% IN NEBU: 2.5000 mg | INHALATION_SOLUTION | Freq: Four times a day (QID) | RESPIRATORY_TRACT | Status: DC | PRN

## 2016-06-25 MED ORDER — POTASSIUM CHLORIDE 10 MEQ/100ML IV SOLN
10.0000 meq | INTRAVENOUS | Status: AC
  Administered 2016-06-25 (×4): 10 meq via INTRAVENOUS
  Filled 2016-06-25: qty 100

## 2016-06-25 NOTE — Progress Notes (Signed)
Patient has refused attempts to ambulate at this time. Will encourage patient to ambulate throughout the shift.

## 2016-06-25 NOTE — Progress Notes (Signed)
PROGRESS NOTE                                                                                                                                                                                                             Patient Demographics:    Tara [name], is a 70 y.o. female, DOB - 04/23/1946, [id number]  Admit date - 06/18/2016   Admitting Physician Courage Denton Brick, MD  Outpatient Primary MD for the patient is [name], MD  LOS - 6  Chief Complaint  Patient presents with  . Abdominal Pain       Brief Narrative   - 70 year old female with history of SVT, nonischemic cardiomyopathy, dyslipidemia, GERD who was admitted on 06/18/2016 with nausea vomiting and nonbloody diarrhea, she has been seen by GI, CT scan done in the ER showed possible gastroduodenitis, GI was consulted, she underwent EGD showing some narrowing around esophagus, biopsy results were nonspecific with some duodenitis. He is currently on PPI and when necessary Imodium and feeling better. Likely discharge tomorrow if stable.   Subjective:    Tara [name] today has, No headache, No chest pain, No abdominal pain - No Nausea, No new weakness tingling or numbness, No Cough - SOB.  Has some diarrhea.   Assessment  & Plan :     1. Nausea vomiting and nonbloody diarrhea. Seen by GI, CT scan and EGD done, biopsy results show nonspecific peptic duodenitis, she is overall feeling better on PPI, diarrhea has also improved, she is C. difficile negative and getting when necessary Imodium. Being managed by GI on dysphagia 3 lactose-free diet. Overall feels better if stable will discharge in the morning with outpatient GI follow-up with Dr. Oneida Alar.  2. Hypokalemia due to diarrhea. Replaced IV Will monitor along with magnesium levels.  3. History of SVT. Continue Coreg.  4. Bipolar disorder. On sertraline 50 mg and stable.    Family Communication  :   Husband  Code Status :  Full  Diet : D3  Disposition Plan  :  Home in am  Consults  :  GI  Procedures  :    EGD performed on 06/19/2016 shows narrowing of the lower esophagus - Biopsy - Duodenum biopsy shows peptic duodenitis without dysplasia or malignancy; Stomach biopsy shows reactive gastropathy negative for H. Pylori, no intestinal metaplasia, dysplasia, or malignancy.  DVT Prophylaxis  :  SCDs    Lab Results  Component Value Date   PLT 359 06/25/2016    Inpatient Medications  Scheduled Meds: . albuterol  2.5 mg Nebulization TID  . azelastine  2 spray Each Nare BID  . carvedilol  6.25 mg Oral BID  . feeding supplement (ENSURE ENLIVE)  237 mL Oral TID BM  . fluticasone  2 spray Each Nare Daily  . pantoprazole  40 mg Oral BID  . potassium chloride  10 mEq Intravenous Q1 Hr x 4  . sertraline  50 mg Oral Daily   Continuous Infusions:  PRN Meds:.acetaminophen **OR** acetaminophen, albuterol, bismuth subsalicylate, clonazePAM, loperamide, ondansetron, phenol, zolpidem  Antibiotics  :    Anti-infectives    Start     Dose/Rate Route Frequency Ordered Stop   06/23/16 2200  clindamycin (CLEOCIN) capsule 300 mg  Status:  Discontinued     300 mg Oral Every 8 hours 06/23/16 1513 06/23/16 1626   06/23/16 2200  clarithromycin (BIAXIN) tablet 500 mg  Status:  Discontinued     500 mg Oral Every 12 hours 06/23/16 1626 06/23/16 1626   06/23/16 2200  amoxicillin (AMOXIL) capsule 1,000 mg  Status:  Discontinued     1,000 mg Oral Every 12 hours 06/23/16 1626 06/23/16 1626   06/23/16 2200  clindamycin (CLEOCIN) capsule 300 mg  Status:  Discontinued     300 mg Oral Every 8 hours 06/23/16 1627 06/24/16 0958   06/23/16 0930  clindamycin (CLEOCIN) capsule 300 mg  Status:  Discontinued     300 mg Oral Every 8 hours 06/23/16 0919 06/23/16 1513   06/22/16 1500  piperacillin-tazobactam (ZOSYN) IVPB 3.375 g  Status:  Discontinued     3.375 g 12.5 mL/hr over 240 Minutes Intravenous Every 8  hours 06/22/16 1448 06/23/16 0919         Objective:   Vitals:   06/24/16 2046 06/24/16 2139 06/25/16 0628 06/25/16 0720  BP:  (!) 108/45 115/60   Pulse:  92 90   Resp:  20 20   Temp:  98.9 F (37.2 C) 98.3 F (36.8 C)   TempSrc:  Oral Oral   SpO2: 96% 97% 96% 96%  Weight:      Height:        Wt Readings from Last 3 Encounters:  06/18/16 83.9 kg (184 lb 15.5 oz)  05/28/16 85.2 kg (187 lb 12.8 oz)  05/01/16 85.3 kg (188 lb)     Intake/Output Summary (Last 24 hours) at 06/25/16 0904 Last data filed at 06/25/16 0135  Gross per 24 hour  Intake              480 ml  Output              100 ml  Net              380 ml     Physical Exam  Awake Alert, Oriented X 3, No new F.N deficits, Normal affect [place].AT,PERRAL Supple Neck,No JVD, No cervical lymphadenopathy appriciated.  Symmetrical Chest wall movement, Good air movement bilaterally, CTAB RRR,No Gallops,Rubs or new Murmurs, No Parasternal Heave +ve B.Sounds, Abd Soft, No tenderness, No organomegaly appriciated, No rebound - guarding or rigidity. No Cyanosis, Clubbing or edema, No new Rash or bruise       Data Review:    CBC  Recent Labs Lab 06/20/16 0607 06/21/16 0546 06/22/16 0624 06/23/16 0600 06/24/16 0823 06/25/16 0546  WBC 15.1* 16.3* 21.2* 22.7* 18.7* 15.8*  HGB 11.0* 10.7* 11.3* 11.0* 11.1* 10.9*  HCT 34.2* 33.2* 34.9* 33.4* 32.9* 32.6*  PLT 277 290 323 316 305 359  MCV 80.3 80.8 81.2 79.9 78.3 78.2  MCH 25.8* 26.0 26.3 26.3 26.4 26.1  MCHC 32.2 32.2 32.4 32.9 33.7 33.4  RDW 15.7* 15.9* 15.9* 16.0* 15.8* 16.0*  LYMPHSABS 2.1 2.0  --  1.8 2.0 2.2  MONOABS 0.9 0.8  --  1.3* 1.0 0.8  EOSABS 4.8* 4.5*  --  1.8* 4.7* 6.3*  BASOSABS 0.0 0.0  --  0.0 0.0 0.0    Chemistries   Recent Labs Lab 06/18/16 1117  06/20/16 0607 06/21/16 0546 06/22/16 0624 06/23/16 0600 06/24/16 0823 06/25/16 0546  NA 140  < > 136 137 132* 133* 137 136  K 3.4*  < > 3.1* 4.1 4.3 3.6 3.6 3.0*  CL 104  < > 105 108  109 102 104 104  CO2 26  < > 23 22 18* 19* 23 23  GLUCOSE 94  < > 106* 80 82 104* 106* 99  BUN 12  < > 6 5* 6 11 21* 21*  CREATININE 1.01*  < > 0.80 0.77 0.77 0.92 1.06* 0.92  CALCIUM 8.6*  < > 7.8* 7.6* 7.8* 8.3* 8.6* 8.4*  MG  --   --   --   --   --   --   --  1.9  AST 22  --  19 18  --   --   --   --   ALT 14  --  14 14  --   --   --   --   ALKPHOS 97  --  82 75  --   --   --   --   BILITOT 0.7  --  0.6 0.6  --   --   --   --   < > = values in this interval not displayed. ------------------------------------------------------------------------------------------------------------------ No results for input(s): CHOL, HDL, LDLCALC, TRIG, CHOLHDL, LDLDIRECT in the last 72 hours.  Lab Results  Component Value Date   HGBA1C 5.8 (H) 10/09/2015   ------------------------------------------------------------------------------------------------------------------ No results for input(s): TSH, T4TOTAL, T3FREE, THYROIDAB in the last 72 hours.  Invalid input(s): FREET3 ------------------------------------------------------------------------------------------------------------------ No results for input(s): VITAMINB12, FOLATE, FERRITIN, TIBC, IRON, RETICCTPCT in the last 72 hours.  Coagulation profile No results for input(s): INR, PROTIME in the last 168 hours.  No results for input(s): DDIMER in the last 72 hours.  Cardiac Enzymes No results for input(s): CKMB, TROPONINI, MYOGLOBIN in the last 168 hours.  Invalid input(s): CK ------------------------------------------------------------------------------------------------------------------    Component Value Date/Time   BNP 80.7 04/23/2016 1554    Micro Results Recent Results (from the past 240 hour(s))  C difficile quick scan w PCR reflex     Status: None   Collection Time: 06/24/16 10:17 AM  Result Value Ref Range Status   C Diff antigen NEGATIVE NEGATIVE Final   C Diff toxin NEGATIVE NEGATIVE Final   C Diff interpretation No C.  difficile detected.  Final    Radiology Reports Dg Chest 2 View  Result Date: 06/22/2016 CLINICAL DATA:  Increased productive cough since yesterday. EXAM: CHEST  2 VIEW COMPARISON:  06/21/2016 FINDINGS: Cardiac silhouette is mildly enlarged. No mediastinal or hilar masses or evidence of adenopathy. There are calcified right paratracheal right hilar lymph nodes. Streaky opacity in the medial lung bases, left greater than right, is likely chronic bronchitic change. This is stable. Lungs otherwise clear. There is a small hiatal hernia.  No pleural effusion or pneumothorax. Nasogastric tube curls within the stomach. Bony thorax is demineralized but grossly intact. There stable changes from prior right breast surgery. IMPRESSION: 1. No convincing acute cardiopulmonary disease. Stable appearance from the previous day's study. 2. Chronic bronchitic change in the lung bases. 3. NG tube well positioned. 4. Mild cardiomegaly. Electronically Signed   By: Lajean Manes M.D.   On: 06/22/2016 13:14   Ct Abdomen Pelvis W Contrast  Result Date: 06/18/2016 CLINICAL DATA:  Referring epigastric pain for 1 month, vomiting on and off for 2 days. Leukocytosis. EXAM: CT ABDOMEN AND PELVIS WITH CONTRAST TECHNIQUE: Multidetector CT imaging of the abdomen and pelvis was performed using the standard protocol following bolus administration of intravenous contrast. CONTRAST:  146mL ISOVUE-300 IOPAMIDOL (ISOVUE-300) INJECTION 61% COMPARISON:  CT exams from 03/04/2007 FINDINGS: Lower chest: Small right pleural effusion. Small to moderate-sized hiatal hernia. Distal esophageal circumferential thickening up to 9 mm with some enteric contrast seen in the distal esophagus and hiatal hernia possibly from reflux or incomplete emptying. Left lower lobe and lingular ground-glass opacities possibly representing areas of pneumonitis and cortical thickness. Sequela of CHF and/or hypoventilatory change are also possibilities. Hepatobiliary:  Cholecystectomy. Mild intrahepatic ductal dilatation which can be seen in the setting prior cholecystectomy. 6 mm right hepatic hypodensity is smaller than on prior exam, previously having measured 9 mm. Small hemangioma is a possibility. A 3 mm left hepatic hypodensity too small to further characterize the stable since 2008, benign finding. Pancreas: Thin and attenuated along the body and tail. Slightly more bulbous appearance without focal mass at the pancreatic head. There appears to be some mild ectasia of the distal pancreatic duct up to 6 mm at the level of the portal and splenic vein confluence. Spleen: Normal in size without focal abnormality. Adrenals/Urinary Tract: Stable 7 mm cyst in the upper pole the right kidney. No obstructive uropathy or enhancing mass lesions. The ureters are unremarkable. Bladder is nondistended. Stomach/Bowel: There is a large amount of retained oral contrast within the stomach. There is thickening of the gastric antrum. The duodenum is thickened and distended in appearance as with similar appearance of the proximal jejunum. Beyond this, the caliber of small and large bowel are normal. There is scattered diverticulosis along the descending and sigmoid colon. There is a small amount of free fluid in the right upper quadrant along the left pericolic gutter extending into the pelvis. Vascular/Lymphatic: Small subcentimeter mesenteric lymph nodes. Minimal atherosclerosis of the distal aorta. No aneurysm or dissection. Reproductive: Hysterectomy.  No adnexal mass. Other: Small amount of free fluid along the left pericolic gutter, pelvis and right upper quadrant. Musculoskeletal: No acute osseous abnormality. IMPRESSION: Moderate to markedly thickened gastric antrum with duodenal thickening and distension consistent with the gastric antritis and duodenitis. Given normal appearance of the body and tail of pancreas, pancreatitis believed less likely but can be correlated with pancreatic  enzymes. Moderate to marked retention of oral contrast within the stomach. Patient would benefit from NG tube placement to decompress stomach. Left lower lobe ground-glass opacities possibly representing areas of alveolitis peritonitis, sequela of CHF or hypoventilatory change. Hysterectomy. Cholecystectomy. Hepatic and renal hypodensities consistent with cysts. Electronically Signed   By: Ashley Royalty M.D.   On: 06/18/2016 16:35   Dg Abd Acute W/chest  Result Date: 06/21/2016 CLINICAL DATA:  SBO, PATIENT STATES " SHE HAS BEEN IN [place] Thursday AND HASN'T HAD A BOWL MOVEMENT, HAS ALSO BEEN VOMITING" PATIENT CURRENTLY HAS A NG TUBEHISTORY  OF HIATAL HERNIA, CHF, GERD, SVT, CANCER, NONISCHEMIC CARDIOMYOPATHY, DYSTHYMIA, ANXIETY EXAM: DG ABDOMEN ACUTE W/ 1V CHEST COMPARISON:  CT, 06/18/2016.  Chest radiograph, 04/09/2016. FINDINGS: There is no bowel dilation to suggest obstruction. There are several air-fluid levels on the erect view mostly within the right colon. There is no free air. Nasogastric tube curls within the stomach tip projecting in the distal stomach. Cardiac silhouette is mildly enlarged. No mediastinal or hilar masses. Calcified right peritracheal and right hilar lymph nodes are noted. Lungs are clear. No pleural effusion or pneumothorax. There right axillary vascular clips. IMPRESSION: 1. No radiographic evidence of a bowel obstruction.  No free air. 2. No acute cardiopulmonary disease. Electronically Signed   By: Lajean Manes M.D.   On: 06/21/2016 14:22   Dg Abd Acute W/chest  Result Date: 06/18/2016 CLINICAL DATA:  Cough for 3 weeks, vomiting for 2 days EXAM: DG ABDOMEN ACUTE W/ 1V CHEST COMPARISON:  04/09/2016 FINDINGS: Cardiomegaly again noted. No acute infiltrate or pulmonary edema. Surgical clips are noted in right axilla. Calcified right hilar and mediastinal lymph nodes are again noted. There is normal small bowel gas pattern. Postcholecystectomy surgical clips are noted.  Surgical clips are noted within pelvis. IMPRESSION: Negative abdominal radiographs.  No acute cardiopulmonary disease. Electronically Signed   By: Lahoma Crocker M.D.   On: 06/18/2016 11:59    Time Spent in minutes  30   Kamarie Veno K M.D on 06/25/2016 at 9:04 AM  Between 7am to 7pm - Pager - [phone]  After 7pm go to [email or web address] - password [facility]  [facility] -  Office  [phone]

## 2016-06-25 NOTE — Progress Notes (Signed)
Patient has refused attempts to ambulate. Will encourage patient to ambulate throughout the shift.

## 2016-06-25 NOTE — Care Management Important Message (Signed)
Important Message  Patient Details  Name: JOLEIGH RADEBAUGH MRN: [id number] Date of Birth: 06-Sep-1946   Medicare Important Message Given:  Yes    Sherald Barge, RN 06/25/2016, 3:44 PM

## 2016-06-25 NOTE — Progress Notes (Signed)
Subjective:  Patient states she had diarrhea at least 10 times in last 24 hours. No vomiting. Tolerating food. Had milk for breakfast (came on tray) even those she is on lactose free diet. Some postprandial bloating but not bad. Had cramping last night after dinner but better after BM. No melena, brbpr.   Objective: Vital signs in last 24 hours: Temp:  [98.3 F (36.8 C)-98.9 F (37.2 C)] 98.3 F (36.8 C) (10/04 0628) Pulse Rate:  [88-92] 90 (10/04 0628) Resp:  [20] 20 (10/04 0628) BP: (108-133)/(45-60) 115/60 (10/04 0628) SpO2:  [94 %-97 %] 96 % (10/04 0720) Last BM Date: 06/24/16 General:   Alert,  Well-developed, well-nourished, pleasant and cooperative in NAD Head:  Normocephalic and atraumatic. Eyes:  Sclera clear, no icterus.  Abdomen:  Soft, nontender and nondistended. Normal bowel sounds, without guarding, and without rebound.   Extremities:  Without clubbing, deformity or edema. Neurologic:  Alert and  oriented x4;  grossly normal neurologically. Skin:  Intact without significant lesions or rashes. Psych:  Alert and cooperative. Normal mood and affect.  Intake/Output from previous day: 10/03 0701 - 10/04 0700 In: 720 [P.O.:720] Out: 100 [Urine:100] Intake/Output this shift: No intake/output data recorded.  Lab Results: CBC  Recent Labs  06/23/16 0600 06/24/16 0823 06/25/16 0546  WBC 22.7* 18.7* 15.8*  HGB 11.0* 11.1* 10.9*  HCT 33.4* 32.9* 32.6*  MCV 79.9 78.3 78.2  PLT 316 305 359   BMET  Recent Labs  06/23/16 0600 06/24/16 0823 06/25/16 0546  NA 133* 137 136  K 3.6 3.6 3.0*  CL 102 104 104  CO2 19* 23 23  GLUCOSE 104* 106* 99  BUN 11 21* 21*  CREATININE 0.92 1.06* 0.92  CALCIUM 8.3* 8.6* 8.4*   LFTs No results for input(s): BILITOT, BILIDIR, IBILI, ALKPHOS, AST, ALT, PROT, ALBUMIN in the last 72 hours. No results for input(s): LIPASE in the last 72 hours. PT/INR No results for input(s): LABPROT, INR in the last 72 hours.    Imaging  Studies: Dg Chest 2 View  Result Date: 06/22/2016 CLINICAL DATA:  Increased productive cough since yesterday. EXAM: CHEST  2 VIEW COMPARISON:  06/21/2016 FINDINGS: Cardiac silhouette is mildly enlarged. No mediastinal or hilar masses or evidence of adenopathy. There are calcified right paratracheal right hilar lymph nodes. Streaky opacity in the medial lung bases, left greater than right, is likely chronic bronchitic change. This is stable. Lungs otherwise clear. There is a small hiatal hernia. No pleural effusion or pneumothorax. Nasogastric tube curls within the stomach. Bony thorax is demineralized but grossly intact. There stable changes from prior right breast surgery. IMPRESSION: 1. No convincing acute cardiopulmonary disease. Stable appearance from the previous day's study. 2. Chronic bronchitic change in the lung bases. 3. NG tube well positioned. 4. Mild cardiomegaly. Electronically Signed   By: Lajean Manes M.D.   On: 06/22/2016 13:14   Ct Abdomen Pelvis W Contrast  Result Date: 06/18/2016 CLINICAL DATA:  Referring epigastric pain for 1 month, vomiting on and off for 2 days. Leukocytosis. EXAM: CT ABDOMEN AND PELVIS WITH CONTRAST TECHNIQUE: Multidetector CT imaging of the abdomen and pelvis was performed using the standard protocol following bolus administration of intravenous contrast. CONTRAST:  171mL ISOVUE-300 IOPAMIDOL (ISOVUE-300) INJECTION 61% COMPARISON:  CT exams from 03/04/2007 FINDINGS: Lower chest: Small right pleural effusion. Small to moderate-sized hiatal hernia. Distal esophageal circumferential thickening up to 9 mm with some enteric contrast seen in the distal esophagus and hiatal hernia possibly from reflux or incomplete emptying.  Left lower lobe and lingular ground-glass opacities possibly representing areas of pneumonitis and cortical thickness. Sequela of CHF and/or hypoventilatory change are also possibilities. Hepatobiliary: Cholecystectomy. Mild intrahepatic ductal  dilatation which can be seen in the setting prior cholecystectomy. 6 mm right hepatic hypodensity is smaller than on prior exam, previously having measured 9 mm. Small hemangioma is a possibility. A 3 mm left hepatic hypodensity too small to further characterize the stable since 2008, benign finding. Pancreas: Thin and attenuated along the body and tail. Slightly more bulbous appearance without focal mass at the pancreatic head. There appears to be some mild ectasia of the distal pancreatic duct up to 6 mm at the level of the portal and splenic vein confluence. Spleen: Normal in size without focal abnormality. Adrenals/Urinary Tract: Stable 7 mm cyst in the upper pole the right kidney. No obstructive uropathy or enhancing mass lesions. The ureters are unremarkable. Bladder is nondistended. Stomach/Bowel: There is a large amount of retained oral contrast within the stomach. There is thickening of the gastric antrum. The duodenum is thickened and distended in appearance as with similar appearance of the proximal jejunum. Beyond this, the caliber of small and large bowel are normal. There is scattered diverticulosis along the descending and sigmoid colon. There is a small amount of free fluid in the right upper quadrant along the left pericolic gutter extending into the pelvis. Vascular/Lymphatic: Small subcentimeter mesenteric lymph nodes. Minimal atherosclerosis of the distal aorta. No aneurysm or dissection. Reproductive: Hysterectomy.  No adnexal mass. Other: Small amount of free fluid along the left pericolic gutter, pelvis and right upper quadrant. Musculoskeletal: No acute osseous abnormality. IMPRESSION: Moderate to markedly thickened gastric antrum with duodenal thickening and distension consistent with the gastric antritis and duodenitis. Given normal appearance of the body and tail of pancreas, pancreatitis believed less likely but can be correlated with pancreatic enzymes. Moderate to marked retention of  oral contrast within the stomach. Patient would benefit from NG tube placement to decompress stomach. Left lower lobe ground-glass opacities possibly representing areas of alveolitis peritonitis, sequela of CHF or hypoventilatory change. Hysterectomy. Cholecystectomy. Hepatic and renal hypodensities consistent with cysts. Electronically Signed   By: Ashley Royalty M.D.   On: 06/18/2016 16:35   Dg Abd Acute W/chest  Result Date: 06/21/2016 CLINICAL DATA:  SBO, PATIENT STATES " [place] SINCE Thursday AND HASN'T HAD A BOWL MOVEMENT, HAS ALSO BEEN VOMITING" PATIENT CURRENTLY HAS A NG TUBEHISTORY OF HIATAL HERNIA, CHF, GERD, SVT, CANCER, NONISCHEMIC CARDIOMYOPATHY, DYSTHYMIA, ANXIETY EXAM: DG ABDOMEN ACUTE W/ 1V CHEST COMPARISON:  CT, 06/18/2016.  Chest radiograph, 04/09/2016. FINDINGS: There is no bowel dilation to suggest obstruction. There are several air-fluid levels on the erect view mostly within the right colon. There is no free air. Nasogastric tube curls within the stomach tip projecting in the distal stomach. Cardiac silhouette is mildly enlarged. No mediastinal or hilar masses. Calcified right peritracheal and right hilar lymph nodes are noted. Lungs are clear. No pleural effusion or pneumothorax. There right axillary vascular clips. IMPRESSION: 1. No radiographic evidence of a bowel obstruction.  No free air. 2. No acute cardiopulmonary disease. Electronically Signed   By: Lajean Manes M.D.   On: 06/21/2016 14:22   Dg Abd Acute W/chest  Result Date: 06/18/2016 CLINICAL DATA:  Cough for 3 weeks, vomiting for 2 days EXAM: DG ABDOMEN ACUTE W/ 1V CHEST COMPARISON:  04/09/2016 FINDINGS: Cardiomegaly again noted. No acute infiltrate or pulmonary edema. Surgical clips are noted in right  axilla. Calcified right hilar and mediastinal lymph nodes are again noted. There is normal small bowel gas pattern. Postcholecystectomy surgical clips are noted. Surgical clips are noted within pelvis.  IMPRESSION: Negative abdominal radiographs.  No acute cardiopulmonary disease. Electronically Signed   By: Lahoma Crocker M.D.   On: 06/18/2016 11:59  [2 weeks]   Assessment: 70 year old female with nausea, vomiting, EGD with abnormal appearance of stomach and duodenum, pathology benign. Continues to clinically improve slowly. Abdomen less distended today. NGT removed. Tolerating diet. Has had 10 loose stools in the past 24 hours some very small. Incontinent when she coughs. No N/V, abdominal pain.  Labs today show some improvement in leukocytosis. Ongoing diarrhea with negative Cdiff.    Plan: 1. Advised patient to avoid dairy. 2. D/c ensure as per previous recommendations.  3. Imodium prn. 4. Outpatient EUS.  Laureen Ochs. [facility] [phone] 10/4/201710:00 AM     LOS: 6 days

## 2016-06-25 NOTE — Progress Notes (Signed)
At 2130 pt ambulated with one assist in hallway, denied any dizziness or SOB upon ambulation. Discussed with pt that we would ambulate again in the morning.

## 2016-06-26 ENCOUNTER — Other Ambulatory Visit: Payer: Self-pay

## 2016-06-26 ENCOUNTER — Telehealth: Payer: Self-pay

## 2016-06-26 ENCOUNTER — Encounter: Payer: Self-pay | Admitting: Gastroenterology

## 2016-06-26 ENCOUNTER — Telehealth: Payer: Self-pay | Admitting: Gastroenterology

## 2016-06-26 DIAGNOSIS — R198 Other specified symptoms and signs involving the digestive system and abdomen: Secondary | ICD-10-CM

## 2016-06-26 DIAGNOSIS — R933 Abnormal findings on diagnostic imaging of other parts of digestive tract: Secondary | ICD-10-CM

## 2016-06-26 LAB — BASIC METABOLIC PANEL
Anion gap: 6 (ref 5–15)
BUN: 21 mg/dL — AB (ref 6–20)
CALCIUM: 8.4 mg/dL — AB (ref 8.9–10.3)
CHLORIDE: 106 mmol/L (ref 101–111)
CO2: 24 mmol/L (ref 22–32)
Creatinine, Ser: 0.93 mg/dL (ref 0.44–1.00)
GFR calc Af Amer: >60 mL/min (ref >60)
GLUCOSE: 105 mg/dL — AB (ref 65–99)
POTASSIUM: 3.6 mmol/L (ref 3.5–5.1)
Sodium: 136 mmol/L (ref 135–145)

## 2016-06-26 LAB — CBC
HCT: 32.2 % — ABNORMAL LOW (ref 36.0–46.0)
HEMOGLOBIN: 10.6 g/dL — AB (ref 12.0–15.0)
MCH: 26 pg (ref 26.0–34.0)
MCHC: 32.9 g/dL (ref 30.0–36.0)
MCV: 78.9 fL (ref 78.0–100.0)
PLATELETS: 331 10*3/uL (ref 150–400)
RBC: 4.08 MIL/uL (ref 3.87–5.11)
RDW: 16 % — ABNORMAL HIGH (ref 11.5–15.5)
WBC: 14 10*3/uL — ABNORMAL HIGH (ref 4.0–10.5)

## 2016-06-26 LAB — MAGNESIUM: MAGNESIUM: 1.8 mg/dL (ref 1.7–2.4)

## 2016-06-26 MED ORDER — PANTOPRAZOLE SODIUM 40 MG PO TBEC: 40.0000 mg | DELAYED_RELEASE_TABLET | Freq: Two times a day (BID) | ORAL | 0 refills | Status: DC

## 2016-06-26 MED ORDER — LOPERAMIDE HCL 2 MG PO CAPS: 2.0000 mg | ORAL_CAPSULE | Freq: Four times a day (QID) | ORAL | 0 refills | Status: DC | PRN

## 2016-06-26 MED ORDER — LOSARTAN POTASSIUM 25 MG PO TABS: 12.5000 mg | ORAL_TABLET | Freq: Every day | ORAL | 0 refills | Status: DC

## 2016-06-26 NOTE — Discharge Instructions (Signed)
Follow with Primary MD [name], MD in 7 days   Get CBC, CMP, 2 view Chest X ray checked  by Primary MD or SNF MD in 5-7 days ( we routinely change or add medications that can affect your baseline labs and fluid status, therefore we recommend that you get the mentioned basic workup next visit with your PCP, your PCP may decide not to get them or add new tests based on their clinical decision)   Activity: As tolerated with Full fall precautions use walker/cane & assistance as needed   Disposition Home     Diet:   Soft lactose free diet with feeding assistance and aspiration precautions.  For Heart failure patients - Check your Weight same time everyday, if you gain over 2 pounds, or you develop in leg swelling, experience more shortness of breath or chest pain, call your Primary MD immediately. Follow Cardiac Low Salt Diet and 1.5 lit/day fluid restriction.   On your next visit with your primary care physician please Get Medicines reviewed and adjusted.   Please request your Prim.MD to go over all Hospital Tests and Procedure/Radiological results at the follow up, please get all Hospital records sent to your Prim MD by signing hospital release before you go home.   If you experience worsening of your admission symptoms, develop shortness of breath, life threatening emergency, suicidal or homicidal thoughts you must seek medical attention immediately by calling 911 or calling your MD immediately  if symptoms less severe.  You Must read complete instructions/literature along with all the possible adverse reactions/side effects for all the Medicines you take and that have been prescribed to you. Take any new Medicines after you have completely understood and accpet all the possible adverse reactions/side effects.   Do not drive, operate heavy machinery, perform activities at heights, swimming or participation in water activities or provide baby sitting services if your were admitted for  syncope or siezures until you have seen by Primary MD or a Neurologist and advised to do so again.  Do not drive when taking Pain medications.    Do not take more than prescribed Pain, Sleep and Anxiety Medications  Special Instructions: If you have smoked or chewed Tobacco  in the last 2 yrs please stop smoking, stop any regular Alcohol  and or any Recreational drug use.  Wear Seat belts while driving.   Please note  You were cared for by a hospitalist during your hospital stay. If you have any questions about your discharge medications or the care you received while you were in the hospital after you are discharged, you can call the unit and asked to speak with the hospitalist on call if the hospitalist that took care of you is not available. Once you are discharged, your primary care physician will handle any further medical issues. Please note that NO REFILLS for any discharge medications will be authorized once you are discharged, as it is imperative that you return to your primary care physician (or establish a relationship with a primary care physician if you do not have one) for your aftercare needs so that they can reassess your need for medications and monitor your lab values.

## 2016-06-26 NOTE — Telephone Encounter (Signed)
EUS scheduled, pt instructed and medications reviewed.  Patient instructions mailed to home.  Patient to call with any questions or concerns.  

## 2016-06-26 NOTE — Plan of Care (Signed)
Problem: Safety: Goal: Ability to remain free from injury will improve Outcome: Progressing Pt ambulated in hallway with one assist, denied pain or SOB upon ambulation. Will continue to monitor.

## 2016-06-26 NOTE — Telephone Encounter (Signed)
Referral sent to Dr. Ardis Hughs via EPIC.

## 2016-06-26 NOTE — Telephone Encounter (Signed)
OK, she needs upper EUS, radial +/- linear, in 4 weeks (to allow time for recovery from her acute illness). ++MAC, for abnormal UGI tract.  Thanks

## 2016-06-26 NOTE — Progress Notes (Signed)
Pt will be discharged with husband to home.

## 2016-06-26 NOTE — Care Management Note (Signed)
Case Management Note  Patient Details  Name: [name] MRN: [id number] Date of Birth: Aug 16, 1946  Expected Discharge Date:     06/26/2016             Expected Discharge Plan:  Home/Self Care  In-House Referral:  NA  Discharge planning Services  CM Consult  Post Acute Care Choice:  NA Choice offered to:  NA  DME Arranged:    DME Agency:     HH Arranged:    [place] Agency:     Status of Service:  Completed, signed off  If discussed at [place] of Stay Meetings, dates discussed:  06/26/2016  Additional Comments: Pt discharging home today with self care. No CM needs.   Sherald Barge, RN 06/26/2016, 9:51 AM

## 2016-06-26 NOTE — Telephone Encounter (Signed)
Please arrange for outpatient EUS with Dr. Ardis Hughs to evaluate abnormal esophagus/antrum/duodenum.

## 2016-06-26 NOTE — Discharge Summary (Signed)
Tara [name] [id number] DOB: [birth year] DOA: 06/18/2016  PCP: [name], MD  Admit date: 06/18/2016  Discharge date: 06/26/2016  Admitted From: Home   Disposition:  Home   Recommendations for Outpatient Follow-up:   Follow up with PCP in 1-2 weeks  PCP Please obtain BMP/CBC, 2 view CXR in 1week,  (see Discharge instructions)   PCP Please follow up on the following pending results: None   Home Health: None   Equipment/Devices: None  Consultations: GI Discharge Condition: Stable   CODE STATUS: Full   Diet Recommendation: Soft lactose free   Chief Complaint  Patient presents with  . Abdominal Pain     Brief history of present illness from the day of admission and additional interim summary    70 year old female with history of SVT, nonischemic cardiomyopathy, dyslipidemia, GERD who was admitted on 06/18/2016 with nausea vomiting and nonbloody diarrhea, she has been seen by GI, CT scan done in the ER showed possible gastroduodenitis, GI was consulted, she underwent EGD showing some narrowing around esophagus, biopsy results were nonspecific with some duodenitis. He is currently on PPI and when necessary Imodium and feeling better.   Hospital issues addressed     1. Nausea vomiting and nonbloody diarrhea. Seen by GI, CT scan and EGD done, CT scan showed changes stable of gastritis with some nonspecific changes as below, biopsy results show nonspecific peptic duodenitis, she is overall feeling better on PPI, diarrhea has also improved, she is C. difficile negative and getting when necessary Imodium. She was managed by GI on dysphagia 3 lactose-free diet. Overall feels better if stable , her diarrhea has resolved, no dysphagia at the time eager to go home, will follow with Dr. rote and Dr. Ardis Hughs for  outpatient GI follow-up and possible outpatient EUS per GI.  2. Hypokalemia due to diarrhea. Replaced IV Will monitor along with magnesium levels.  3. History of SVT. Continue Coreg.  4. Bipolar disorder. On sertraline 50 mg and stable.   Discharge diagnosis     Principal Problem:   Emesis, persistent Active Problems:   GERD   Gastritis   Gastric distention   Abnormal CT scan, esophagus   Duodenitis   SBO (small bowel obstruction)    Discharge instructions    Discharge Instructions    Discharge instructions    Complete by:  As directed    Follow with Primary MD [name], MD in 7 days   Get CBC, CMP, 2 view Chest X ray checked  by Primary MD or SNF MD in 5-7 days ( we routinely change or add medications that can affect your baseline labs and fluid status, therefore we recommend that you get the mentioned basic workup next visit with your PCP, your PCP may decide not to get them or add new tests based on their clinical decision)   Activity: As tolerated with Full fall precautions use walker/cane & assistance as needed   Disposition Home     Diet:   Soft lactose free diet with feeding assistance and  aspiration precautions.  For Heart failure patients - Check your Weight same time everyday, if you gain over 2 pounds, or you develop in leg swelling, experience more shortness of breath or chest pain, call your Primary MD immediately. Follow Cardiac Low Salt Diet and 1.5 lit/day fluid restriction.   On your next visit with your primary care physician please Get Medicines reviewed and adjusted.   Please request your Prim.MD to go over all Hospital Tests and Procedure/Radiological results at the follow up, please get all Hospital records sent to your Prim MD by signing hospital release before you go home.   If you experience worsening of your admission symptoms, develop shortness of breath, life threatening emergency, suicidal or homicidal thoughts you must seek  medical attention immediately by calling 911 or calling your MD immediately  if symptoms less severe.  You Must read complete instructions/literature along with all the possible adverse reactions/side effects for all the Medicines you take and that have been prescribed to you. Take any new Medicines after you have completely understood and accpet all the possible adverse reactions/side effects.   Do not drive, operate heavy machinery, perform activities at heights, swimming or participation in water activities or provide baby sitting services if your were admitted for syncope or siezures until you have seen by Primary MD or a Neurologist and advised to do so again.  Do not drive when taking Pain medications.    Do not take more than prescribed Pain, Sleep and Anxiety Medications  Special Instructions: If you have smoked or chewed Tobacco  in the last 2 yrs please stop smoking, stop any regular Alcohol  and or any Recreational drug use.  Wear Seat belts while driving.   Please note  You were cared for by a hospitalist during your hospital stay. If you have any questions about your discharge medications or the care you received while you were in the hospital after you are discharged, you can call the unit and asked to speak with the hospitalist on call if the hospitalist that took care of you is not available. Once you are discharged, your primary care physician will handle any further medical issues. Please note that NO REFILLS for any discharge medications will be authorized once you are discharged, as it is imperative that you return to your primary care physician (or establish a relationship with a primary care physician if you do not have one) for your aftercare needs so that they can reassess your need for medications and monitor your lab values.   Increase activity slowly    Complete by:  As directed       Discharge Medications     Medication List    STOP taking these medications     famotidine 20 MG tablet Commonly known as:  PEPCID   metoCLOPramide 10 MG tablet Commonly known as:  REGLAN     TAKE these medications   azelastine 0.1 % nasal spray Commonly known as:  ASTELIN Place 2 sprays into both nostrils 2 (two) times daily. Use in each nostril as directed What changed:  when to take this  reasons to take this  additional instructions   bismuth subsalicylate 99991111 [id number] suspension Commonly known as:  PEPTO BISMOL Take 30 mLs by mouth 4 (four) times daily as needed for diarrhea or loose stools.   carvedilol 6.25 MG tablet Commonly known as:  COREG Take 1 tablet (6.25 mg total) by mouth 2 (two) times daily. Start 1/2 tablet twice a day -  increase to a whole tablet twice a day in 2 weeks.   clonazePAM 0.5 MG tablet Commonly known as:  KLONOPIN TAKE (1) TABLET BY MOUTH AT BEDTIME.   docusate sodium 100 MG capsule Commonly known as:  COLACE Take 100 mg by mouth 2 (two) times daily.   fexofenadine 180 MG tablet Commonly known as:  ALLEGRA Take 180 mg by mouth daily as needed for allergies.   fluticasone 50 MCG/ACT nasal spray Commonly known as:  FLONASE Place 2 sprays into both nostrils daily.   loperamide 2 MG capsule Commonly known as:  IMODIUM Take 1 capsule (2 mg total) by mouth 4 (four) times daily as needed for diarrhea or loose stools.   losartan 25 MG tablet Commonly known as:  COZAAR Take 0.5 tablets (12.5 mg total) by mouth daily. What changed:  how much to take   lovastatin 40 MG tablet Commonly known as:  MEVACOR Take 1 tablet (40 mg total) by mouth at bedtime.   pantoprazole 40 MG tablet Commonly known as:  PROTONIX Take 1 tablet (40 mg total) by mouth 2 (two) times daily. What changed:  when to take this   sertraline 50 MG tablet Commonly known as:  ZOLOFT Take 1 tablet (50 mg total) by mouth daily.   zolpidem 10 MG tablet Commonly known as:  AMBIEN Take 1 tablet (10 mg total) by mouth at bedtime as needed for  sleep.       Follow-up Information    Manus Rudd, MD. Schedule an appointment as soon as possible for a visit in 1 week(s).   Specialty:  Gastroenterology Contact information: [place] [address] [phone]        [name], MD. Schedule an appointment as soon as possible for a visit in 1 week(s).   Specialty:  Family Medicine Contact information: [address], [place] [address] [phone]           Major procedures and Radiology Reports - PLEASE review detailed and final reports thoroughly  -     EGD performed on 06/19/2016 shows narrowing of the lower esophagus - Biopsy - Duodenum biopsy shows peptic duodenitis without dysplasia or malignancy; Stomach biopsy shows reactive gastropathy negative for H. Pylori, no intestinal metaplasia, dysplasia, or malignancy.  Dg Chest 2 View  Result Date: 06/22/2016 CLINICAL DATA:  Increased productive cough since yesterday. EXAM: CHEST  2 VIEW COMPARISON:  06/21/2016 FINDINGS: Cardiac silhouette is mildly enlarged. No mediastinal or hilar masses or evidence of adenopathy. There are calcified right paratracheal right hilar lymph nodes. Streaky opacity in the medial lung bases, left greater than right, is likely chronic bronchitic change. This is stable. Lungs otherwise clear. There is a small hiatal hernia. No pleural effusion or pneumothorax. Nasogastric tube curls within the stomach. Bony thorax is demineralized but grossly intact. There stable changes from prior right breast surgery. IMPRESSION: 1. No convincing acute cardiopulmonary disease. Stable appearance from the previous day's study. 2. Chronic bronchitic change in the lung bases. 3. NG tube well positioned. 4. Mild cardiomegaly. Electronically Signed   By: Lajean Manes M.D.   On: 06/22/2016 13:14   Ct Abdomen Pelvis W Contrast  Result Date: 06/18/2016 CLINICAL DATA:  Referring epigastric pain for 1 month, vomiting on and off for 2  days. Leukocytosis. EXAM: CT ABDOMEN AND PELVIS WITH CONTRAST TECHNIQUE: Multidetector CT imaging of the abdomen and pelvis was performed using the standard protocol following bolus administration of intravenous contrast. CONTRAST:  137mL ISOVUE-300  IOPAMIDOL (ISOVUE-300) INJECTION 61% COMPARISON:  CT exams from 03/04/2007 FINDINGS: Lower chest: Small right pleural effusion. Small to moderate-sized hiatal hernia. Distal esophageal circumferential thickening up to 9 mm with some enteric contrast seen in the distal esophagus and hiatal hernia possibly from reflux or incomplete emptying. Left lower lobe and lingular ground-glass opacities possibly representing areas of pneumonitis and cortical thickness. Sequela of CHF and/or hypoventilatory change are also possibilities. Hepatobiliary: Cholecystectomy. Mild intrahepatic ductal dilatation which can be seen in the setting prior cholecystectomy. 6 mm right hepatic hypodensity is smaller than on prior exam, previously having measured 9 mm. Small hemangioma is a possibility. A 3 mm left hepatic hypodensity too small to further characterize the stable since 2008, benign finding. Pancreas: Thin and attenuated along the body and tail. Slightly more bulbous appearance without focal mass at the pancreatic head. There appears to be some mild ectasia of the distal pancreatic duct up to 6 mm at the level of the portal and splenic vein confluence. Spleen: Normal in size without focal abnormality. Adrenals/Urinary Tract: Stable 7 mm cyst in the upper pole the right kidney. No obstructive uropathy or enhancing mass lesions. The ureters are unremarkable. Bladder is nondistended. Stomach/Bowel: There is a large amount of retained oral contrast within the stomach. There is thickening of the gastric antrum. The duodenum is thickened and distended in appearance as with similar appearance of the proximal jejunum. Beyond this, the caliber of small and large bowel are normal. There is  scattered diverticulosis along the descending and sigmoid colon. There is a small amount of free fluid in the right upper quadrant along the left pericolic gutter extending into the pelvis. Vascular/Lymphatic: Small subcentimeter mesenteric lymph nodes. Minimal atherosclerosis of the distal aorta. No aneurysm or dissection. Reproductive: Hysterectomy.  No adnexal mass. Other: Small amount of free fluid along the left pericolic gutter, pelvis and right upper quadrant. Musculoskeletal: No acute osseous abnormality. IMPRESSION: Moderate to markedly thickened gastric antrum with duodenal thickening and distension consistent with the gastric antritis and duodenitis. Given normal appearance of the body and tail of pancreas, pancreatitis believed less likely but can be correlated with pancreatic enzymes. Moderate to marked retention of oral contrast within the stomach. Patient would benefit from NG tube placement to decompress stomach. Left lower lobe ground-glass opacities possibly representing areas of alveolitis peritonitis, sequela of CHF or hypoventilatory change. Hysterectomy. Cholecystectomy. Hepatic and renal hypodensities consistent with cysts. Electronically Signed   By: Ashley Royalty M.D.   On: 06/18/2016 16:35   Dg Abd Acute W/chest  Result Date: 06/21/2016 CLINICAL DATA:  SBO, PATIENT STATES " [place] SINCE Thursday AND HASN'T HAD A BOWL MOVEMENT, HAS ALSO BEEN VOMITING" PATIENT CURRENTLY HAS A NG TUBEHISTORY OF HIATAL HERNIA, CHF, GERD, SVT, CANCER, NONISCHEMIC CARDIOMYOPATHY, DYSTHYMIA, ANXIETY EXAM: DG ABDOMEN ACUTE W/ 1V CHEST COMPARISON:  CT, 06/18/2016.  Chest radiograph, 04/09/2016. FINDINGS: There is no bowel dilation to suggest obstruction. There are several air-fluid levels on the erect view mostly within the right colon. There is no free air. Nasogastric tube curls within the stomach tip projecting in the distal stomach. Cardiac silhouette is mildly enlarged. No mediastinal or  hilar masses. Calcified right peritracheal and right hilar lymph nodes are noted. Lungs are clear. No pleural effusion or pneumothorax. There right axillary vascular clips. IMPRESSION: 1. No radiographic evidence of a bowel obstruction.  No free air. 2. No acute cardiopulmonary disease. Electronically Signed   By: Lajean Manes M.D.   On: 06/21/2016  14:22   Dg Abd Acute W/chest  Result Date: 06/18/2016 CLINICAL DATA:  Cough for 3 weeks, vomiting for 2 days EXAM: DG ABDOMEN ACUTE W/ 1V CHEST COMPARISON:  04/09/2016 FINDINGS: Cardiomegaly again noted. No acute infiltrate or pulmonary edema. Surgical clips are noted in right axilla. Calcified right hilar and mediastinal lymph nodes are again noted. There is normal small bowel gas pattern. Postcholecystectomy surgical clips are noted. Surgical clips are noted within pelvis. IMPRESSION: Negative abdominal radiographs.  No acute cardiopulmonary disease. Electronically Signed   By: Lahoma Crocker M.D.   On: 06/18/2016 11:59    Micro Results    Recent Results (from the past 240 hour(s))  C difficile quick scan w PCR reflex     Status: None   Collection Time: 06/24/16 10:17 AM  Result Value Ref Range Status   C Diff antigen NEGATIVE NEGATIVE Final   C Diff toxin NEGATIVE NEGATIVE Final   C Diff interpretation No C. difficile detected.  Final    Today   Subjective    Tara [name] today has no headache,no chest abdominal pain,no new weakness tingling or numbness, feels much better wants to go home today.    Objective   Blood pressure (!) 101/59, pulse 79, temperature 98.1 F (36.7 C), temperature source Oral, resp. rate 20, height 4\' 11"  (1.499 m), weight 83.9 kg (184 lb 15.5 oz), SpO2 94 %.   Intake/Output Summary (Last 24 hours) at 06/26/16 0935 Last data filed at 06/25/16 2300  Gross per 24 hour  Intake              340 ml  Output              300 ml  Net               40 ml    Exam Awake Alert, Oriented x 3, No new F.N deficits, Normal  affect [place].AT,PERRAL Supple Neck,No JVD, No cervical lymphadenopathy appriciated.  Symmetrical Chest wall movement, Good air movement bilaterally, CTAB RRR,No Gallops,Rubs or new Murmurs, No Parasternal Heave +ve B.Sounds, Abd Soft, Non tender, No organomegaly appriciated, No rebound -guarding or rigidity. No Cyanosis, Clubbing or edema, No new Rash or bruise   Data Review   CBC w Diff:  Lab Results  Component Value Date   WBC 14.0 (H) 06/26/2016   HGB 10.6 (L) 06/26/2016   HGB 12.4 10/12/2014   HCT 32.2 (L) 06/26/2016   HCT 39.3 10/12/2014   PLT 331 06/26/2016   PLT 231 10/12/2014   LYMPHOPCT 14 06/25/2016   LYMPHOPCT 24.0 10/12/2014   MONOPCT 5 06/25/2016   MONOPCT 4.8 10/12/2014   EOSPCT 40 06/25/2016   EOSPCT 8.7 (H) 10/12/2014   BASOPCT 0 06/25/2016   BASOPCT 0.2 10/12/2014    CMP:  Lab Results  Component Value Date   NA 136 06/26/2016   NA 143 10/12/2014   K 3.6 06/26/2016   K 3.9 10/12/2014   CL 106 06/26/2016   CO2 24 06/26/2016   CO2 25 10/12/2014   BUN 21 (H) 06/26/2016   BUN 13.9 10/12/2014   CREATININE 0.93 06/26/2016   CREATININE 1.10 (H) 05/28/2016   CREATININE 1.0 10/12/2014   PROT 6.5 06/21/2016   PROT 7.4 10/12/2014   ALBUMIN 2.9 (L) 06/21/2016   ALBUMIN 3.5 10/12/2014   BILITOT 0.6 06/21/2016   BILITOT 0.25 10/12/2014   ALKPHOS 75 06/21/2016   ALKPHOS 102 10/12/2014   AST 18 06/21/2016   AST 33 10/12/2014   ALT  14 06/21/2016   ALT 27 10/12/2014  .   Total Time in preparing paper work, data evaluation and todays exam - 35 minutes  Thurnell Lose M.D on 06/26/2016 at 9:35 AM  [facility]   Office  [phone]

## 2016-06-26 NOTE — Telephone Encounter (Signed)
-----   Message from Hassan Rowan, LPN sent at [id number]  9:23 AM EDT ----- Neil Crouch PA ordered to arrange outpatient EUS with Dr. Ardis Hughs to evaluate abnormal esophagus/antrum/duodenum. Referral sent via EPIC.

## 2016-06-26 NOTE — Telephone Encounter (Signed)
Dr Jacobs please review  

## 2016-07-01 NOTE — Progress Notes (Signed)
CARDIOLOGY OFFICE NOTE  Date:  07/02/2016    [name] Date of Birth: [birth year] Medical Record [id number]  PCP:  [name], MD  Cardiologist:  Servando [facility]    Chief Complaint  Patient presents with  . Congestive Heart Failure    1 month check - seen for Dr. Rayann Heman    History of Present Illness: TSUNEKO ZHAN is a 70 y.o. female who presents today for a one month check. Seen for Dr. Rayann Heman.   She has a history of SVT, prior NICM and HLD. She has had history of breast cancer and has had chemo/radiation/surgery.  Seen back in June by Dr. Rayann Heman - felt to be doing ok. Echo was updated. EF had worsened. I then saw her for discussion. She had DOE. Using too much salt. Started her on low dose ARB. Plan to change her Verapamil over to Coreg going forward.  Myoview was updated to rule out ischemia as an etiology.   Last seen a month ago - changed her over to [place].   Admitted earlier this month with N,V, and diarrhea. Seen by GI, had CT scan and EGD done, CT scan showed changes stable of gastritis with some nonspecific changes as below, biopsy results show nonspecific peptic duodenitis - she improved on PPI therapy and prn Imodium. Negative for Cdiff.   Comes in today. Here with her husband. She says she is feeling better. No more N, V or diarrhea. Seeing PCP tomorrow and GI again later this month. Cardiac status seems stable. Not dizzy. No chest pain. Not short of breath. No swelling. No palpitations.   Past Medical History:  Diagnosis Date  . Anxiety   . Breast cancer [facility]) 2008   right - s/p lumpectomy->chemo, radiation  . Depression   . Dysrhythmia    hx SVT  . GERD (gastroesophageal reflux disease)   . H/O: hysterectomy   . Hiatal hernia   . History of cancer chemotherapy   . History of radiation therapy   . Hyperlipidemia   . Nonischemic cardiomyopathy ([place])   . SVT (supraventricular tachycardia) ([place])    short RP SVT documented 5/14  .  Systolic CHF [facility])     Past Surgical History:  Procedure Laterality Date  . ABDOMINAL HYSTERECTOMY  1994   fibroids,   . BIOPSY  06/19/2016   Procedure: BIOPSY;  Surgeon: Daneil Dolin, MD;  Location: [facility];  Service: Endoscopy;;  gastric duodenum  . BREAST SURGERY Right 2008   lumpectomy, cancer  . CHOLECYSTECTOMY  1999  . COLONOSCOPY  2008   Dr. Oneida Alar: multiple polyps. Path not available at time of visit.   Marland Kitchen COLONOSCOPY WITH PROPOFOL N/A 04/25/2014   Dr. Oneida Alar: Multiple tubular adenomas removed. Diverticulosis. Moderate internal hemorrhoids. Next colonoscopy planned for August 2018.  Marland Kitchen ESOPHAGOGASTRODUODENOSCOPY (EGD) WITH PROPOFOL N/A 06/19/2016   Procedure: ESOPHAGOGASTRODUODENOSCOPY (EGD) WITH PROPOFOL;  Surgeon: Daneil Dolin, MD;  Location: [facility] ENDO [facility];  Service: Endoscopy;  Laterality: N/A;  . POLYPECTOMY N/A 04/25/2014   Procedure: POLYPECTOMY;  Surgeon: Danie Binder, MD;  Location: [facility] ORS;  Service: Endoscopy;  Laterality: N/A;  Ascending and Decending Colon x3 , Transverse colon x2, rectal     Medications: Current Outpatient Prescriptions  Medication Sig Dispense Refill  . azelastine (ASTELIN) 0.1 % nasal spray Place 2 sprays into both nostrils 2 (two) times daily. Use in each nostril as directed (Patient taking differently: Place 2 sprays into both nostrils 2 (two)  times daily as needed for allergies. Use in each nostril as directed) 30 mL 12  . carvedilol (COREG) 6.25 MG tablet Take 1 tablet (6.25 mg total) by mouth 2 (two) times daily. Start 1/2 tablet twice a day - increase to a whole tablet twice a day in 2 weeks. 60 tablet 3  . clonazePAM (KLONOPIN) 0.5 MG tablet TAKE (1) TABLET BY MOUTH AT BEDTIME. 90 tablet 0  . docusate sodium (COLACE) 100 MG capsule Take 100 mg by mouth 2 (two) times daily.    . fexofenadine (ALLEGRA) 180 MG tablet Take 180 mg by mouth daily as needed for allergies.     . fluticasone (FLONASE) 50 MCG/ACT nasal spray Place 2 sprays into  both nostrils daily. 16 g 2  . losartan (COZAAR) 25 MG tablet Take 0.5 tablets (12.5 mg total) by mouth daily. 30 tablet 0  . lovastatin (MEVACOR) 40 MG tablet Take 1 tablet (40 mg total) by mouth at bedtime. 90 tablet 1  . pantoprazole (PROTONIX) 40 MG tablet Take 1 tablet (40 mg total) by mouth 2 (two) times daily. 60 tablet 0  . sertraline (ZOLOFT) 50 MG tablet Take 1 tablet (50 mg total) by mouth daily. 90 tablet 1  . zolpidem (AMBIEN) 10 MG tablet Take 1 tablet (10 mg total) by mouth at bedtime as needed for sleep. 90 tablet 1   No current facility-administered medications for this visit.     Allergies: Allergies  Allergen Reactions  . Aspirin Other (See Comments)    Reports GI bleed with daily use  . Lisinopril Cough  . Sudafed [Pseudoephedrine Hcl]     Pt reports she had drainage in throat that made her throat hurt.     Social History: The patient  reports that she has never smoked. She has never used smokeless tobacco. She reports that she does not drink alcohol or use drugs.   Family History: The patient's family history includes Cancer (age of onset: 18) in her brother; Colon cancer (age of onset: 33) in her father; Hypertension in her sister.   Review of Systems: Please see the history of present illness.   Otherwise, the review of systems is positive for none.    All other systems are reviewed and negative.   Physical Exam: VS:  BP 110/80   Pulse 78   Ht 4\' 11"  (1.499 m)   Wt 180 lb (81.6 kg)   SpO2 97% Comment: at rest  BMI 36.36 kg/m  .  BMI Body mass index is 36.36 kg/m.  Wt Readings from Last 3 Encounters:  07/02/16 180 lb (81.6 kg)  06/18/16 184 lb 15.5 oz (83.9 kg)  05/28/16 187 lb 12.8 oz (85.2 kg)    General: Pleasant. She is alert and in no acute distress. Color looks a little sallow today. She is down 7 pounds since last visit with me.   HEENT: Normal.  Neck: Supple, no JVD, carotid bruits, or masses noted.  Cardiac: Regular rate and rhythm. No  murmurs, rubs, or gallops. No edema.  Respiratory:  Lungs are clear to auscultation bilaterally with normal work of breathing.  GI: Soft and nontender.  MS: No deformity or atrophy. Gait and ROM intact.  Skin: Warm and dry. Color is normal.  Neuro:  Strength and sensation are intact and no gross focal deficits noted.  Psych: Alert, appropriate and with normal affect.   LABORATORY DATA:  EKG:  EKG is not ordered today.  Lab Results  Component Value Date  WBC 14.0 (H) 06/26/2016   HGB 10.6 (L) 06/26/2016   HCT 32.2 (L) 06/26/2016   PLT 331 06/26/2016   GLUCOSE 105 (H) 06/26/2016   CHOL 205 (H) 10/09/2015   TRIG 135 10/09/2015   HDL 59 10/09/2015   LDLCALC 119 10/09/2015   ALT 14 06/21/2016   AST 18 06/21/2016   NA 136 06/26/2016   K 3.6 06/26/2016   CL 106 06/26/2016   CREATININE 0.93 06/26/2016   BUN 21 (H) 06/26/2016   CO2 24 06/26/2016   TSH 3.956 10/09/2015   HGBA1C 5.8 (H) 10/09/2015    BNP (last 3 results)  Recent Labs  03/16/16 1236 04/23/16 1554  BNP 227.0* 80.7    ProBNP (last 3 results) No results for input(s): PROBNP in the last 8760 hours.   Other Studies Reviewed Today:  Myoview Study Highlights from 04/2016   The left ventricular ejection fraction is moderately decreased (30-44%).  Nuclear stress EF: 34%.  There was no ST segment deviation noted during stress.  Defect 1: There is a small defect of moderate severity present in the apical lateral and apex location.  Findings consistent with prior myocardial infarction or scar.  This is a high risk study due to reduced systolic function. No ischemia was identified.    Echo Study Conclusions from 02/2016  - Left ventricle: The cavity size was mildly dilated. Wall thickness was normal. Systolic function was moderately reduced. The estimated ejection fraction was in the range of 35% to 40%. The study is not technically sufficient to allow evaluation of LV diastolic function. -  Aortic valve: Valve area (VTI): 1.99 cm^2. Valve area (Vmax): 2.19 cm^2. Valve area (Vmean): 2.23 cm^2. - Mitral valve: There was mild regurgitation. - Left atrium: The atrium was mildly to moderately dilated. - Pulmonary arteries: PA peak pressure: 49 mm Hg (S).  Assessment/Plan: 1. Worsening LV function - Myoview without ischemia - assuming this is NICM - She has been changed over to Coreg from Verapamil. She has been placed on ARB therapy as well. Currently without symptoms. Will get echo in one month.   2. Chronic systolic HF - fairly well compensated. Continue with salt restriction. I have left her on her current regimen. BP much lower with current regimen and recent GI illness.   3. Prior history of SVT - controlled on beta blocker therapy  4. HTN -  BP looks great on current regimen.   5. Prior history of breast cancer.   6. Recent admission for N, V and diarrhea - improved. Seeing PCP tomorrow - will defer labs to her.    Current medicines are reviewed with the patient today.  The patient does not have concerns regarding medicines other than what has been noted above.  The following changes have been made:  See above.  Labs/ tests ordered today include:    Orders Placed This Encounter  Procedures  . ECHOCARDIOGRAM COMPLETE     Disposition:   Further disposition pending.  Patient is agreeable to this plan and will call if any problems develop in the interim.   Signed: Burtis Junes, RN, ANP-C 07/02/2016 2:50 PM  [facility] [address] [place], [place]  [address] Phone: [phone] Fax: [phone]

## 2016-07-02 ENCOUNTER — Ambulatory Visit (INDEPENDENT_AMBULATORY_CARE_PROVIDER_SITE_OTHER): Payer: Medicare Other | Admitting: Nurse Practitioner

## 2016-07-02 ENCOUNTER — Encounter: Payer: Self-pay | Admitting: Nurse Practitioner

## 2016-07-02 VITALS — BP 110/80 | HR 78 | Ht 59.0 in | Wt 180.0 lb

## 2016-07-02 DIAGNOSIS — I428 Other cardiomyopathies: Secondary | ICD-10-CM

## 2016-07-02 DIAGNOSIS — I471 Supraventricular tachycardia: Secondary | ICD-10-CM | POA: Diagnosis not present

## 2016-07-02 NOTE — Patient Instructions (Addendum)
We will be checking the following labs today - NONE   Medication Instructions:    Continue with your current medicines.     Testing/Procedures To Be Arranged:  Echocardiogram after November 11th  Follow-Up:   We will see what the echo shows and then decide about your follow up.    Other Special Instructions:   N/A    If you need a refill on your cardiac medications before your next appointment, please call your pharmacy.   Call the [place] office at [phone] if you have any questions, problems or concerns.

## 2016-07-03 ENCOUNTER — Encounter: Payer: Self-pay | Admitting: Family Medicine

## 2016-07-03 ENCOUNTER — Ambulatory Visit (INDEPENDENT_AMBULATORY_CARE_PROVIDER_SITE_OTHER): Payer: Medicare Other | Admitting: Family Medicine

## 2016-07-03 VITALS — BP 114/80 | HR 85 | Ht 59.0 in | Wt 180.0 lb

## 2016-07-03 DIAGNOSIS — Z23 Encounter for immunization: Secondary | ICD-10-CM

## 2016-07-03 DIAGNOSIS — E782 Mixed hyperlipidemia: Secondary | ICD-10-CM

## 2016-07-03 DIAGNOSIS — K219 Gastro-esophageal reflux disease without esophagitis: Secondary | ICD-10-CM

## 2016-07-03 DIAGNOSIS — R7303 Prediabetes: Secondary | ICD-10-CM | POA: Diagnosis not present

## 2016-07-03 DIAGNOSIS — Z09 Encounter for follow-up examination after completed treatment for conditions other than malignant neoplasm: Secondary | ICD-10-CM | POA: Diagnosis not present

## 2016-07-03 DIAGNOSIS — I471 Supraventricular tachycardia: Secondary | ICD-10-CM

## 2016-07-03 DIAGNOSIS — K3189 Other diseases of stomach and duodenum: Secondary | ICD-10-CM

## 2016-07-03 MED ORDER — CARVEDILOL 6.25 MG PO TABS: 6.2500 mg | ORAL_TABLET | Freq: Two times a day (BID) | ORAL | 3 refills | Status: DC

## 2016-07-03 NOTE — Patient Instructions (Addendum)
F/u nov 6 as before, call if you need me sooner  StOP losartan  Take carvedilol one tablet twice daily, start tomorrow    Fasting lipid, cmp and cBC, 3 days before Nov visit  Pneumonia 23 vaccine today

## 2016-07-06 ENCOUNTER — Encounter: Payer: Self-pay | Admitting: Family Medicine

## 2016-07-06 DIAGNOSIS — Z09 Encounter for follow-up examination after completed treatment for conditions other than malignant neoplasm: Secondary | ICD-10-CM | POA: Insufficient documentation

## 2016-07-06 NOTE — Assessment & Plan Note (Signed)
Hyperlipidemia:Low fat diet discussed and encouraged.   Lipid Panel  Lab Results  Component Value Date   CHOL 205 (H) 10/09/2015   HDL 59 10/09/2015   LDLCALC 119 10/09/2015   TRIG 135 10/09/2015   CHOLHDL 3.5 10/09/2015     Updated lab needed at/ before next visit.

## 2016-07-06 NOTE — Assessment & Plan Note (Signed)
Sinus rhythm currently , meds adjusted as discussed

## 2016-07-06 NOTE — Assessment & Plan Note (Signed)
Controlled, no change in medication  

## 2016-07-06 NOTE — Assessment & Plan Note (Signed)
After obtaining informed consent, the vaccine is  administered by LPN.  

## 2016-07-06 NOTE — Progress Notes (Signed)
   Tara [name]     MRN: [id number]      DOB: [birth year]   HPI [name]  Patient in for follow up of recent hospitalization. Discharge summary, and laboratory and radiology data are reviewed, and any questions or concerns about recent hospitalization are discussed. Specific issues requiring follow up are specifically addressed.   ROS Denies recent fever or chills. Denies sinus pressure, nasal congestion, ear pain or sore throat. Denies chest congestion, productive cough or wheezing. Denies chest pains, palpitations and leg swelling Had 1 episode of emesis this morning, no loose stool, constipation or bloody stool Denies dysuria, frequency, hesitancy or incontinence. Denies joint pain, swelling and limitation in mobility. Denies headaches, seizures, numbness, or tingling. Denies uncontrolled depression, anxiety or insomnia. Denies skin break down or rash.   PE  BP 114/80   Pulse 85   Ht 4\' 11"  (1.499 m)   Wt 180 lb (81.6 kg)   SpO2 92%   BMI 36.36 kg/m   Patient alert and oriented and in no cardiopulmonary distress.  HEENT: No facial asymmetry, EOMI,   oropharynx pink and moist.  Neck supple no JVD, no mass.  Chest: Clear to auscultation bilaterally.  CVS: S1, S2 no murmurs, no S3.Regular rate.  ABD: Soft non tender. Hypoactive BS, no guarding or rebound, no organomegaly or  mass  Ext: No edema  MS: Adequate ROM spine, shoulders, hips and knees.  Skin: Intact, no ulcerations or rash noted.  Psych: Good eye contact, normal affect. Memory intact not anxious or depressed appearing.  CNS: CN 2-12 intact, power,  normal throughout.no focal deficits noted.   [facility] discharge follow-up Discharge summary reviewed and recommendations carried out. Medications reviewed with pt , she was not following d/c instruction advice She is to increase coreg dose , due to h/o SVT and d/c losartan F/U is in next 2 to 3 weeks  Gastric  distention resolved  SVT (supraventricular tachycardia) Sinus rhythm currently , meds adjusted as discussed  GERD Ongoing nausea with emesis of water  This am, pt to reduce solid intake and graduate diet as tolerated  DEPRESSION Controlled, no change in medication   Need for 23-polyvalent pneumococcal polysaccharide vaccine After obtaining informed consent, the vaccine is  administered by LPN.   Hyperlipidemia Hyperlipidemia:Low fat diet discussed and encouraged.   Lipid Panel  Lab Results  Component Value Date   CHOL 205 (H) 10/09/2015   HDL 59 10/09/2015   LDLCALC 119 10/09/2015   TRIG 135 10/09/2015   CHOLHDL 3.5 10/09/2015     Updated lab needed at/ before next visit.

## 2016-07-06 NOTE — Assessment & Plan Note (Signed)
Ongoing nausea with emesis of water  This am, pt to reduce solid intake and graduate diet as tolerated

## 2016-07-06 NOTE — Assessment & Plan Note (Signed)
Discharge summary reviewed and recommendations carried out. Medications reviewed with pt , she was not following d/c instruction advice She is to increase coreg dose , due to h/o SVT and d/c losartan F/U is in next 2 to 3 weeks

## 2016-07-06 NOTE — Assessment & Plan Note (Signed)
resolved 

## 2016-07-10 ENCOUNTER — Other Ambulatory Visit: Payer: Self-pay

## 2016-07-10 MED ORDER — LOVASTATIN 40 MG PO TABS: 40.0000 mg | ORAL_TABLET | Freq: Every day | ORAL | 0 refills | Status: DC

## 2016-07-10 MED ORDER — PANTOPRAZOLE SODIUM 40 MG PO TBEC: 40.0000 mg | DELAYED_RELEASE_TABLET | Freq: Two times a day (BID) | ORAL | 0 refills | Status: DC

## 2016-07-10 MED ORDER — SERTRALINE HCL 50 MG PO TABS: 50.0000 mg | ORAL_TABLET | Freq: Every day | ORAL | 1 refills | Status: DC

## 2016-07-17 ENCOUNTER — Encounter: Payer: Self-pay | Admitting: Nurse Practitioner

## 2016-07-17 ENCOUNTER — Ambulatory Visit (INDEPENDENT_AMBULATORY_CARE_PROVIDER_SITE_OTHER): Payer: Medicare Other | Admitting: Nurse Practitioner

## 2016-07-17 DIAGNOSIS — Z66 Do not resuscitate: Secondary | ICD-10-CM | POA: Diagnosis not present

## 2016-07-17 DIAGNOSIS — I429 Cardiomyopathy, unspecified: Secondary | ICD-10-CM | POA: Diagnosis not present

## 2016-07-17 DIAGNOSIS — K3189 Other diseases of stomach and duodenum: Secondary | ICD-10-CM | POA: Diagnosis not present

## 2016-07-17 DIAGNOSIS — E860 Dehydration: Secondary | ICD-10-CM | POA: Diagnosis not present

## 2016-07-17 DIAGNOSIS — R9431 Abnormal electrocardiogram [ECG] [EKG]: Secondary | ICD-10-CM | POA: Diagnosis not present

## 2016-07-17 DIAGNOSIS — R6881 Early satiety: Secondary | ICD-10-CM | POA: Diagnosis not present

## 2016-07-17 DIAGNOSIS — R1310 Dysphagia, unspecified: Secondary | ICD-10-CM | POA: Diagnosis not present

## 2016-07-17 DIAGNOSIS — E663 Overweight: Secondary | ICD-10-CM | POA: Diagnosis not present

## 2016-07-17 DIAGNOSIS — R918 Other nonspecific abnormal finding of lung field: Secondary | ICD-10-CM | POA: Diagnosis not present

## 2016-07-17 DIAGNOSIS — R1084 Generalized abdominal pain: Secondary | ICD-10-CM | POA: Diagnosis not present

## 2016-07-17 DIAGNOSIS — R933 Abnormal findings on diagnostic imaging of other parts of digestive tract: Secondary | ICD-10-CM | POA: Diagnosis not present

## 2016-07-17 DIAGNOSIS — R112 Nausea with vomiting, unspecified: Secondary | ICD-10-CM | POA: Insufficient documentation

## 2016-07-17 DIAGNOSIS — R140 Abdominal distension (gaseous): Secondary | ICD-10-CM | POA: Diagnosis not present

## 2016-07-17 DIAGNOSIS — F419 Anxiety disorder, unspecified: Secondary | ICD-10-CM | POA: Diagnosis not present

## 2016-07-17 DIAGNOSIS — F329 Major depressive disorder, single episode, unspecified: Secondary | ICD-10-CM | POA: Diagnosis not present

## 2016-07-17 DIAGNOSIS — D721 Eosinophilia: Secondary | ICD-10-CM | POA: Diagnosis not present

## 2016-07-17 DIAGNOSIS — K449 Diaphragmatic hernia without obstruction or gangrene: Secondary | ICD-10-CM | POA: Diagnosis not present

## 2016-07-17 DIAGNOSIS — K2980 Duodenitis without bleeding: Secondary | ICD-10-CM | POA: Diagnosis not present

## 2016-07-17 DIAGNOSIS — J90 Pleural effusion, not elsewhere classified: Secondary | ICD-10-CM | POA: Diagnosis not present

## 2016-07-17 DIAGNOSIS — R109 Unspecified abdominal pain: Secondary | ICD-10-CM | POA: Insufficient documentation

## 2016-07-17 DIAGNOSIS — K311 Adult hypertrophic pyloric stenosis: Secondary | ICD-10-CM | POA: Diagnosis not present

## 2016-07-17 DIAGNOSIS — K209 Esophagitis, unspecified: Secondary | ICD-10-CM | POA: Diagnosis not present

## 2016-07-17 DIAGNOSIS — R630 Anorexia: Secondary | ICD-10-CM | POA: Diagnosis not present

## 2016-07-17 DIAGNOSIS — E876 Hypokalemia: Secondary | ICD-10-CM | POA: Diagnosis not present

## 2016-07-17 DIAGNOSIS — J680 Bronchitis and pneumonitis due to chemicals, gases, fumes and vapors: Secondary | ICD-10-CM | POA: Diagnosis not present

## 2016-07-17 DIAGNOSIS — J439 Emphysema, unspecified: Secondary | ICD-10-CM | POA: Diagnosis not present

## 2016-07-17 DIAGNOSIS — R0902 Hypoxemia: Secondary | ICD-10-CM | POA: Diagnosis not present

## 2016-07-17 DIAGNOSIS — K2930 Chronic superficial gastritis without bleeding: Secondary | ICD-10-CM | POA: Diagnosis not present

## 2016-07-17 DIAGNOSIS — I110 Hypertensive heart disease with heart failure: Secondary | ICD-10-CM | POA: Diagnosis not present

## 2016-07-17 DIAGNOSIS — R197 Diarrhea, unspecified: Secondary | ICD-10-CM | POA: Diagnosis not present

## 2016-07-17 DIAGNOSIS — K5281 Eosinophilic gastritis or gastroenteritis: Secondary | ICD-10-CM | POA: Diagnosis not present

## 2016-07-17 DIAGNOSIS — I5022 Chronic systolic (congestive) heart failure: Secondary | ICD-10-CM | POA: Diagnosis not present

## 2016-07-17 DIAGNOSIS — J189 Pneumonia, unspecified organism: Secondary | ICD-10-CM | POA: Diagnosis not present

## 2016-07-17 DIAGNOSIS — F418 Other specified anxiety disorders: Secondary | ICD-10-CM | POA: Diagnosis not present

## 2016-07-17 DIAGNOSIS — K222 Esophageal obstruction: Secondary | ICD-10-CM | POA: Diagnosis not present

## 2016-07-17 DIAGNOSIS — D72829 Elevated white blood cell count, unspecified: Secondary | ICD-10-CM | POA: Diagnosis not present

## 2016-07-17 NOTE — Telephone Encounter (Signed)
Patient is here in our ov and she is very weak and unable to keep any food down was wondering if we could get her EUS done sooner

## 2016-07-17 NOTE — Assessment & Plan Note (Signed)
Generalized abdominal tenderness without peritoneal signs. This could be a component of developing small bowel obstruction similar to her recent hospitalization versus persistent nausea and heaving. Further workup and plan to send to [facility] emergency department as per above.

## 2016-07-17 NOTE — Patient Instructions (Signed)
1. As we discussed, proceed to the emergency department at [facility] in [place].

## 2016-07-17 NOTE — Progress Notes (Signed)
cc'ed to pcp °

## 2016-07-17 NOTE — Progress Notes (Signed)
Referring Provider: [name], MD Primary Care Physician:  [name], MD Primary GI:  Dr. Oneida Alar  Chief Complaint  Patient presents with  . Emesis    weakness  . Weight Loss    lost 10 lbs in past couple weeks    HPI:   Tara [name] is a 70 y.o. female who presents For hospital follow-up. The patient was admitted to the hospital 06/18/2016 through 06/24/2016. Primary issue was nonbloody nausea and vomiting, dysphagia with dysmotility and duodenitis. She also had nonbloody diarrhea. CT in the ER showed possible gastroduodenitis. Underwent upper endoscopy which showed some narrowing around the esophagus with the biopsy results being nonspecific. Also duodenitis. She was started on Protonix and Immodium. She clinically improved and was d/c to home. She was also scheduled for outpatient EUS with Dr. Ardis Hughs in [place] (tentatively scheduled for 07/24/16).  Today she states she feels terrible. She started having symptoms a couple weeks ago. Is having N/V, last emesis was last night. Is unable to keep down food and fluids. Last actual meal she has been able to keep down was a couple weeks ago, very poor appetite. Has had some lightheadedness. Also with fatigue, malaise. Last bowel movement movement was yesterday, no hematochezia. Had some dark stools with pepto last week, none since then. Is also having generalized tenderness which is moderate to significant. Thinks part of it is related to persistent heaving and vomiting. No GERD symptoms associated with onset of nausea. Is still taking Protonix. Denies chest pain, dyspnea, syncope, near syncope. Denies any other upper or lower GI symptoms.  Past Medical History:  Diagnosis Date  . Anxiety   . Breast cancer [facility]) 2008   right - s/p lumpectomy->chemo, radiation  . Depression   . Dysrhythmia    hx SVT  . GERD (gastroesophageal reflux disease)   . H/O: hysterectomy   . Hiatal hernia   . History of cancer chemotherapy     . History of radiation therapy   . Hyperlipidemia   . Nonischemic cardiomyopathy ([place])   . SVT (supraventricular tachycardia) ([place])    short RP SVT documented 5/14  . Systolic CHF [facility])     Past Surgical History:  Procedure Laterality Date  . ABDOMINAL HYSTERECTOMY  1994   fibroids,   . BIOPSY  06/19/2016   Procedure: BIOPSY;  Surgeon: Daneil Dolin, MD;  [facility];  Service: Endoscopy;;  gastric duodenum  . BREAST SURGERY Right 2008   lumpectomy, cancer  . CHOLECYSTECTOMY  1999  . COLONOSCOPY  2008   Dr. Oneida Alar: multiple polyps. Path not available at time of visit.   Marland Kitchen COLONOSCOPY WITH PROPOFOL N/A 04/25/2014   Dr. Oneida Alar: Multiple tubular adenomas removed. Diverticulosis. Moderate internal hemorrhoids. Next colonoscopy planned for August 2018.  Marland Kitchen ESOPHAGOGASTRODUODENOSCOPY (EGD) WITH PROPOFOL N/A 06/19/2016   Procedure: ESOPHAGOGASTRODUODENOSCOPY (EGD) WITH PROPOFOL;  Surgeon: Daneil Dolin, MD;  Location[facility];  Service: Endoscopy;  Laterality: N/A;  . POLYPECTOMY N/A 04/25/2014   Procedure: POLYPECTOMY;  Surgeon: Danie Binder, MD;  Location: [facility] ORS;  Service: Endoscopy;  Laterality: N/A;  Ascending and Decending Colon x3 , Transverse colon x2, rectal    Current Outpatient Prescriptions  Medication Sig Dispense Refill  . azelastine (ASTELIN) 0.1 % nasal spray Place 2 sprays into both nostrils 2 (two) times daily. Use in each nostril as directed (Patient taking differently: Place 2 sprays into both nostrils 2 (two) times daily as needed for allergies. Use in  each nostril as directed) 30 mL 12  . carvedilol (COREG) 6.25 MG tablet Take 1 tablet (6.25 mg total) by mouth 2 (two) times daily with a meal. 60 tablet 3  . clonazePAM (KLONOPIN) 0.5 MG tablet TAKE (1) TABLET BY MOUTH AT BEDTIME. 90 tablet 0  . docusate sodium (COLACE) 100 MG capsule Take 100 mg by mouth 2 (two) times daily.    . fexofenadine (ALLEGRA) 180 MG tablet Take 180 mg by mouth daily as needed  for allergies.     . fluticasone (FLONASE) 50 MCG/ACT nasal spray Place 2 sprays into both nostrils daily. 16 g 2  . lovastatin (MEVACOR) 40 MG tablet Take 1 tablet (40 mg total) by mouth at bedtime. 90 tablet 0  . pantoprazole (PROTONIX) 40 MG tablet Take 1 tablet (40 mg total) by mouth 2 (two) times daily. (Patient taking differently: Take 40 mg by mouth daily. ) 180 tablet 0  . sertraline (ZOLOFT) 50 MG tablet Take 1 tablet (50 mg total) by mouth daily. 90 tablet 1  . zolpidem (AMBIEN) 10 MG tablet Take 1 tablet (10 mg total) by mouth at bedtime as needed for sleep. 90 tablet 1   No current facility-administered medications for this visit.     Allergies as of 07/17/2016 - Review Complete 07/17/2016  Allergen Reaction Noted  . Aspirin Other (See Comments) 01/31/2014  . Lisinopril Cough 04/23/2016  . Sudafed [pseudoephedrine hcl]  03/16/2016    Family History  Problem Relation Age of Onset  . Colon cancer Father 46    died at age 41  . Hypertension Sister   . Cancer Brother 57    prostate    Social History   Social History  . Marital status: Married    Spouse name: N/A  . Number of children: 2  . Years of education: N/A   Occupational History  . disabled  Unemployed   Social History Main Topics  . Smoking status: Never Smoker  . Smokeless tobacco: Never Used  . Alcohol use No  . Drug use: No  . Sexual activity: Yes    Birth control/ protection: Surgical   Other Topics Concern  . None   Social History Narrative   Lives in [place] with family.  Does not routinely exercise.    Review of Systems: General: Negative for fever, chills. ENT: Negative for hoarseness, difficulty swallowing. CV: Negative for chest pain, angina, palpitations, peripheral edema.  Respiratory: Negative for dyspnea at rest, cough, sputum, wheezing.  GI: See history of present illness.  Heme: Negative for bruising or bleeding. Allergy: Negative for rash or hives.   Physical Exam: BP  116/73   Pulse 87   Temp 98.3 F (36.8 C) (Oral)   Ht 4\' 9"  (1.448 m)   Wt 170 lb (77.1 kg)   BMI 36.79 kg/m  General:   Alert and oriented. Pleasant and cooperative. Well-nourished and well-developed. Appears ill and "puny" Head:  Normocephalic and atraumatic. Eyes:  Without icterus, sclera clear and conjunctiva pink.  Ears:  Normal auditory acuity. Cardiovascular:  S1, S2 present without murmurs appreciated. Respiratory:  Clear to auscultation bilaterally. No wheezes, rales, or rhonchi. No distress.  Gastrointestinal:  Bowel sounds sluggish, rounded but soft, and non-distended. Generalized TTP. No HSM noted. No guarding or rebound. No masses appreciated.  Rectal:  Deferred  Musculoskalatal:  Symmetrical without gross deformities. Neurologic:  Alert and oriented x4;  grossly normal neurologically. Psych:  Alert and cooperative. Normal mood and affect. Heme/Lymph/Immune: No excessive bruising noted.  07/17/2016 11:50 AM   Disclaimer: This note was dictated with voice recognition software. Similar sounding words can inadvertently be transcribed and may not be corrected upon review.

## 2016-07-17 NOTE — Assessment & Plan Note (Signed)
Abnormal CT of her esophagus with follow-up EGD finding significantly edematous esophagus, narrowed gastroesophageal junction, edematous stomach with normal appearing mucosa. Biopsy findings nonspecific. Recommended outpatient EUS for evaluation of possible etiology including but not limited to angioedema versus submucosal process such as lymphoma. We will refer her to [facility] emergency department due to her significant worsening symptoms as noted below as well as to further evaluate her esophageal abnormality.

## 2016-07-17 NOTE — Telephone Encounter (Signed)
Tara [name] returned call and states the pt will be going to the ED.

## 2016-07-17 NOTE — Assessment & Plan Note (Signed)
As of the past 2 weeks the patient has had a recurrence of her nausea and vomiting. It is nonbloody. It is persistent and at this point she is tolerating minimal food and minimal fluid intake. She is having worsening weakness and fatigue. She appears ill. No peritoneal signs on exam however her abdomen is generally tender and her bowel sounds are sluggish. Concern is for possible small bowel obstruction versus worsening of her esophageal narrowing recently found on EGD with nonspecific findings on biopsy pathology. At this point given her significant worsening of her symptoms and after discussing with Dr. Oneida Alar we will send her to the emergency department at [facility] for further evaluation. She will likely need to be admitted for further evaluation and likely EUS for abnormal esophagus and stomach. Patient is in agreement with the plan.

## 2016-07-18 DIAGNOSIS — R1310 Dysphagia, unspecified: Secondary | ICD-10-CM | POA: Diagnosis not present

## 2016-07-18 DIAGNOSIS — R140 Abdominal distension (gaseous): Secondary | ICD-10-CM | POA: Diagnosis not present

## 2016-07-18 DIAGNOSIS — F418 Other specified anxiety disorders: Secondary | ICD-10-CM | POA: Diagnosis not present

## 2016-07-18 DIAGNOSIS — R112 Nausea with vomiting, unspecified: Secondary | ICD-10-CM | POA: Diagnosis not present

## 2016-07-18 DIAGNOSIS — R6881 Early satiety: Secondary | ICD-10-CM | POA: Diagnosis not present

## 2016-07-18 DIAGNOSIS — D72829 Elevated white blood cell count, unspecified: Secondary | ICD-10-CM | POA: Diagnosis not present

## 2016-07-19 DIAGNOSIS — R9431 Abnormal electrocardiogram [ECG] [EKG]: Secondary | ICD-10-CM | POA: Diagnosis not present

## 2016-07-19 DIAGNOSIS — F418 Other specified anxiety disorders: Secondary | ICD-10-CM | POA: Diagnosis not present

## 2016-07-19 DIAGNOSIS — R112 Nausea with vomiting, unspecified: Secondary | ICD-10-CM | POA: Diagnosis not present

## 2016-07-19 DIAGNOSIS — R140 Abdominal distension (gaseous): Secondary | ICD-10-CM | POA: Diagnosis not present

## 2016-07-19 DIAGNOSIS — D72829 Elevated white blood cell count, unspecified: Secondary | ICD-10-CM | POA: Diagnosis not present

## 2016-07-22 DIAGNOSIS — J189 Pneumonia, unspecified organism: Secondary | ICD-10-CM | POA: Diagnosis not present

## 2016-07-22 DIAGNOSIS — K2930 Chronic superficial gastritis without bleeding: Secondary | ICD-10-CM | POA: Diagnosis not present

## 2016-07-22 DIAGNOSIS — R1310 Dysphagia, unspecified: Secondary | ICD-10-CM | POA: Diagnosis not present

## 2016-07-22 DIAGNOSIS — K2980 Duodenitis without bleeding: Secondary | ICD-10-CM | POA: Diagnosis not present

## 2016-07-22 DIAGNOSIS — K222 Esophageal obstruction: Secondary | ICD-10-CM | POA: Diagnosis not present

## 2016-07-22 DIAGNOSIS — K311 Adult hypertrophic pyloric stenosis: Secondary | ICD-10-CM | POA: Diagnosis not present

## 2016-07-22 DIAGNOSIS — D72829 Elevated white blood cell count, unspecified: Secondary | ICD-10-CM | POA: Diagnosis not present

## 2016-07-22 DIAGNOSIS — R112 Nausea with vomiting, unspecified: Secondary | ICD-10-CM | POA: Diagnosis not present

## 2016-07-22 DIAGNOSIS — F418 Other specified anxiety disorders: Secondary | ICD-10-CM | POA: Diagnosis not present

## 2016-07-22 DIAGNOSIS — R140 Abdominal distension (gaseous): Secondary | ICD-10-CM | POA: Diagnosis not present

## 2016-07-22 DIAGNOSIS — D721 Eosinophilia: Secondary | ICD-10-CM | POA: Diagnosis not present

## 2016-07-22 DIAGNOSIS — R6881 Early satiety: Secondary | ICD-10-CM | POA: Diagnosis not present

## 2016-07-22 DIAGNOSIS — K3189 Other diseases of stomach and duodenum: Secondary | ICD-10-CM | POA: Diagnosis not present

## 2016-07-22 DIAGNOSIS — R197 Diarrhea, unspecified: Secondary | ICD-10-CM | POA: Diagnosis not present

## 2016-07-22 DIAGNOSIS — K209 Esophagitis, unspecified: Secondary | ICD-10-CM | POA: Diagnosis not present

## 2016-07-23 ENCOUNTER — Encounter (HOSPITAL_COMMUNITY): Payer: Self-pay | Admitting: Anesthesiology

## 2016-07-23 DIAGNOSIS — R112 Nausea with vomiting, unspecified: Secondary | ICD-10-CM | POA: Diagnosis not present

## 2016-07-23 DIAGNOSIS — F418 Other specified anxiety disorders: Secondary | ICD-10-CM | POA: Diagnosis not present

## 2016-07-23 DIAGNOSIS — R140 Abdominal distension (gaseous): Secondary | ICD-10-CM | POA: Diagnosis not present

## 2016-07-23 DIAGNOSIS — D72829 Elevated white blood cell count, unspecified: Secondary | ICD-10-CM | POA: Diagnosis not present

## 2016-07-23 NOTE — Anesthesia Preprocedure Evaluation (Deleted)
Anesthesia Evaluation  Patient identified by MRN, date of birth, ID band Patient awake    Reviewed: Allergy & Precautions, H&P , NPO status , Patient's Chart, lab work & pertinent test results  Airway Mallampati: III  TM Distance: >3 FB   Mouth opening: Limited Mouth Opening  Dental  (+) Teeth Intact   Pulmonary shortness of breath and with exertion,    breath sounds clear to auscultation       Cardiovascular hypertension, Pt. on medications +CHF  + dysrhythmias Supra Ventricular Tachycardia  Rhythm:Regular Rate:Normal     Neuro/Psych PSYCHIATRIC DISORDERS Anxiety Depression    GI/Hepatic hiatal hernia, GERD  Medicated and Controlled,  Endo/Other    Renal/GU      Musculoskeletal   Abdominal   Peds  Hematology   Anesthesia Other Findings   Reproductive/Obstetrics                             Anesthesia Physical  Anesthesia Plan  ASA: III  Anesthesia Plan: MAC   Post-op Pain Management:    Induction: Intravenous  Airway Management Planned: Simple Face Mask  Additional Equipment:   Intra-op Plan:   Post-operative Plan:   Informed Consent: I have reviewed the patients History and Physical, chart, labs and discussed the procedure including the risks, benefits and alternatives for the proposed anesthesia with the patient or authorized representative who has indicated his/her understanding and acceptance.   Dental advisory given  Plan Discussed with: CRNA  Anesthesia Plan Comments:         Anesthesia Quick Evaluation

## 2016-07-24 ENCOUNTER — Encounter (HOSPITAL_COMMUNITY): Payer: Self-pay | Admitting: Certified Registered Nurse Anesthetist

## 2016-07-24 ENCOUNTER — Telehealth: Payer: Self-pay | Admitting: Gastroenterology

## 2016-07-24 ENCOUNTER — Ambulatory Visit (HOSPITAL_COMMUNITY): Admission: RE | Admit: 2016-07-24 | Payer: Medicare Other | Source: Ambulatory Visit | Admitting: Gastroenterology

## 2016-07-24 ENCOUNTER — Encounter (HOSPITAL_COMMUNITY): Admission: RE | Payer: Self-pay | Source: Ambulatory Visit

## 2016-07-24 SURGERY — UPPER ESOPHAGEAL ENDOSCOPIC ULTRASOUND (EUS)
Anesthesia: Monitor Anesthesia Care

## 2016-07-24 MED ORDER — PROPOFOL 10 MG/ML IV BOLUS
INTRAVENOUS | Status: AC
  Filled 2016-07-24: qty 40

## 2016-07-24 NOTE — Telephone Encounter (Addendum)
REVIEWED-OBTAIN EUS REPORT OCT 31 FROM [facility].

## 2016-07-24 NOTE — Telephone Encounter (Signed)
Requested EUS report from [facility]

## 2016-07-24 NOTE — Telephone Encounter (Signed)
All; She did not show for her upper EUS today. Looks like she went to [place] last week.  I think they did EUS at that time but I cannot access the report through care everywhere.  Let me know if there is still any need for my help.   Thanks  DJ

## 2016-07-28 ENCOUNTER — Ambulatory Visit: Payer: Medicare Other | Admitting: Family Medicine

## 2016-07-30 NOTE — Telephone Encounter (Signed)
Records printed off from [facility] and placed on Dr. Nona Dell desk.

## 2016-08-19 ENCOUNTER — Ambulatory Visit: Payer: Medicare Other | Admitting: Family Medicine

## 2016-08-19 ENCOUNTER — Other Ambulatory Visit (HOSPITAL_COMMUNITY): Payer: Medicare Other

## 2016-08-19 ENCOUNTER — Encounter: Payer: Self-pay | Admitting: Family Medicine

## 2016-08-20 ENCOUNTER — Other Ambulatory Visit: Payer: Self-pay | Admitting: Family Medicine

## 2016-08-25 ENCOUNTER — Other Ambulatory Visit: Payer: Self-pay | Admitting: Family Medicine

## 2016-08-25 ENCOUNTER — Encounter: Payer: Self-pay | Admitting: Family Medicine

## 2016-08-25 ENCOUNTER — Ambulatory Visit (INDEPENDENT_AMBULATORY_CARE_PROVIDER_SITE_OTHER): Payer: Medicare Other | Admitting: Family Medicine

## 2016-08-25 ENCOUNTER — Ambulatory Visit (HOSPITAL_COMMUNITY)
Admission: RE | Admit: 2016-08-25 | Discharge: 2016-08-25 | Disposition: A | Discharge status: Home / Self Care | Payer: Medicare Other | Source: Ambulatory Visit | Attending: Family Medicine | Admitting: Family Medicine

## 2016-08-25 VITALS — BP 118/74 | HR 74 | Resp 16 | Ht <= 58 in | Wt 172.0 lb

## 2016-08-25 DIAGNOSIS — D539 Nutritional anemia, unspecified: Secondary | ICD-10-CM | POA: Diagnosis not present

## 2016-08-25 DIAGNOSIS — R918 Other nonspecific abnormal finding of lung field: Secondary | ICD-10-CM | POA: Diagnosis not present

## 2016-08-25 DIAGNOSIS — J189 Pneumonia, unspecified organism: Secondary | ICD-10-CM | POA: Diagnosis not present

## 2016-08-25 DIAGNOSIS — Z09 Encounter for follow-up examination after completed treatment for conditions other than malignant neoplasm: Secondary | ICD-10-CM | POA: Diagnosis not present

## 2016-08-25 DIAGNOSIS — J690 Pneumonitis due to inhalation of food and vomit: Secondary | ICD-10-CM | POA: Diagnosis not present

## 2016-08-25 DIAGNOSIS — I517 Cardiomegaly: Secondary | ICD-10-CM | POA: Diagnosis not present

## 2016-08-25 DIAGNOSIS — D72829 Elevated white blood cell count, unspecified: Secondary | ICD-10-CM

## 2016-08-25 DIAGNOSIS — R112 Nausea with vomiting, unspecified: Secondary | ICD-10-CM

## 2016-08-25 DIAGNOSIS — R600 Localized edema: Secondary | ICD-10-CM

## 2016-08-25 DIAGNOSIS — I471 Supraventricular tachycardia: Secondary | ICD-10-CM

## 2016-08-25 NOTE — Patient Instructions (Addendum)
Annual physical exam in 3 month, call if you need me sooner  CXR today f/u pneumonia  CBC and diff and chem 7 today at lab  Call number pon discharge sheet to establish appt time with [facility] in [place] this week  Left leg swelling requires leg elevation and reduced salt and compression hose, no evdience of blood clot  NEED flu vaccine still!

## 2016-08-26 LAB — CBC WITH DIFFERENTIAL/PLATELET
BASOS PCT: 0 %
Basophils Absolute: 0 cells/uL (ref 0–200)
EOS ABS: 9506 {cells}/uL — AB (ref 15–500)
Eosinophils Relative: 49 %
HEMATOCRIT: 31.5 % — AB (ref 35.0–45.0)
Hemoglobin: 9.7 g/dL — ABNORMAL LOW (ref 11.7–15.5)
LYMPHS ABS: 2716 {cells}/uL (ref 850–3900)
Lymphocytes Relative: 14 %
MCH: 25.1 pg — ABNORMAL LOW (ref 27.0–33.0)
MCHC: 30.8 g/dL — ABNORMAL LOW (ref 32.0–36.0)
MCV: 81.4 fL (ref 80.0–100.0)
MONO ABS: 582 {cells}/uL (ref 200–950)
MPV: 8.8 fL (ref 7.5–12.5)
Monocytes Relative: 3 %
NEUTROS ABS: 6596 {cells}/uL (ref 1500–7800)
Neutrophils Relative %: 34 %
PLATELETS: 488 10*3/uL — AB (ref 140–400)
RBC: 3.87 MIL/uL (ref 3.80–5.10)
RDW: 17.2 % — ABNORMAL HIGH (ref 11.0–15.0)
WBC: 19.4 10*3/uL — ABNORMAL HIGH (ref 3.8–10.8)

## 2016-08-26 LAB — BASIC METABOLIC PANEL
BUN: 11 mg/dL (ref 7–25)
CHLORIDE: 109 mmol/L (ref 98–110)
CO2: 25 mmol/L (ref 20–31)
CREATININE: 1.22 mg/dL — AB (ref 0.60–0.93)
Calcium: 8 mg/dL — ABNORMAL LOW (ref 8.6–10.4)
Glucose, Bld: 82 mg/dL (ref 65–99)
POTASSIUM: 3.4 mmol/L — AB (ref 3.5–5.3)
Sodium: 144 mmol/L (ref 135–146)

## 2016-08-26 LAB — PATHOLOGIST SMEAR REVIEW

## 2016-08-27 DIAGNOSIS — I5020 Unspecified systolic (congestive) heart failure: Secondary | ICD-10-CM | POA: Diagnosis not present

## 2016-08-27 DIAGNOSIS — I110 Hypertensive heart disease with heart failure: Secondary | ICD-10-CM | POA: Diagnosis not present

## 2016-08-27 DIAGNOSIS — K311 Adult hypertrophic pyloric stenosis: Secondary | ICD-10-CM | POA: Diagnosis not present

## 2016-08-28 ENCOUNTER — Other Ambulatory Visit: Payer: Self-pay | Admitting: Family Medicine

## 2016-08-28 DIAGNOSIS — R918 Other nonspecific abnormal finding of lung field: Secondary | ICD-10-CM

## 2016-08-28 LAB — B12 AND FOLATE PANEL
Folate: 1 ng/mL — ABNORMAL LOW
Vitamin B-12: 552 pg/mL (ref 200–1100)

## 2016-08-28 LAB — IRON,TIBC AND FERRITIN PANEL
%SAT: 15 % (ref 11–50)
Ferritin: 419 ng/mL — ABNORMAL HIGH (ref 20–288)
Iron: 30 ug/dL — ABNORMAL LOW (ref 45–160)
TIBC: 205 ug/dL — ABNORMAL LOW (ref 250–450)

## 2016-08-29 ENCOUNTER — Telehealth: Payer: Self-pay

## 2016-08-29 DIAGNOSIS — D72829 Elevated white blood cell count, unspecified: Secondary | ICD-10-CM

## 2016-08-29 NOTE — Telephone Encounter (Signed)
-----   Message from [name], MD sent at 08/28/2016 10:35 AM EST ----- pls add anemia panel Pls directly contact pt and spouse , explain that I recommend she see a hematologist (blood specialist) as her white blood cell count is higher than it should be and she is anemic. She sees oncology at [facility] long and may want to see hematology there as well, let me know if that is her preference , I will enter referral Separate note sent on cXR, p[ls also discuss that with her

## 2016-09-02 ENCOUNTER — Telehealth: Payer: Self-pay

## 2016-09-02 ENCOUNTER — Other Ambulatory Visit: Payer: Self-pay

## 2016-09-02 NOTE — Telephone Encounter (Signed)
Patient is aware of the need for the hematology referral. I attempted to enter it but it was wrong. Not sure which dept to send to. Could you enter?

## 2016-09-03 ENCOUNTER — Other Ambulatory Visit: Payer: Self-pay | Admitting: Family Medicine

## 2016-09-03 DIAGNOSIS — D508 Other iron deficiency anemias: Secondary | ICD-10-CM

## 2016-09-03 DIAGNOSIS — D72829 Elevated white blood cell count, unspecified: Secondary | ICD-10-CM

## 2016-09-03 NOTE — Progress Notes (Signed)
mb onc

## 2016-09-03 NOTE — Telephone Encounter (Signed)
Referral entered  

## 2016-09-18 ENCOUNTER — Other Ambulatory Visit: Payer: Self-pay

## 2016-09-18 ENCOUNTER — Telehealth: Payer: Self-pay | Admitting: Family Medicine

## 2016-09-18 DIAGNOSIS — D72829 Elevated white blood cell count, unspecified: Secondary | ICD-10-CM | POA: Insufficient documentation

## 2016-09-18 DIAGNOSIS — R600 Localized edema: Secondary | ICD-10-CM | POA: Insufficient documentation

## 2016-09-18 MED ORDER — LOVASTATIN 40 MG PO TABS: 40.0000 mg | ORAL_TABLET | Freq: Every day | ORAL | 1 refills | Status: DC

## 2016-09-18 NOTE — Assessment & Plan Note (Signed)
Resolved following hospitalization x 2 inm past 3 months

## 2016-09-18 NOTE — Assessment & Plan Note (Signed)
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

## 2016-09-18 NOTE — Assessment & Plan Note (Signed)
Rate controlled on medication 

## 2016-09-18 NOTE — Progress Notes (Signed)
   Tara [name]     MRN: [id number]      DOB: 07/28/1946   HPI [name]  Patient in for follow up of recent hospitalization. Discharge summary, and laboratory and radiology data are reviewed, and any questions or concerns about recent hospitalization are discussed. Specific issues requiring follow up are specifically addressed. C/o leg swelling following recent long car ride, denies calf pain, swelling, tenderness, dyspnea or cough  ROS Denies recent fever or chills. Denies sinus pressure, nasal congestion, ear pain or sore throat. Denies chest congestion, productive cough or wheezing. Denies chest pains, palpitations Denies any current abdominal pain, nausea, vomiting,diarrhea or constipation.   Denies dysuria, frequency, hesitancy or incontinence. Denies joint pain, swelling and limitation in mobility. Denies headaches, seizures, numbness, or tingling. Denies uncontrolled epression, anxiety or insomnia. Denies skin break down or rash.   PE  BP 118/74   Pulse 74   Resp 16   Ht 4\' 9"  (1.448 m)   Wt 172 lb (78 kg)   SpO2 93%   BMI 37.22 kg/m   Patient alert and oriented and in no cardiopulmonary distress.  HEENT: No facial asymmetry, EOMI,   oropharynx pink and moist.  Neck supple no JVD, no mass.  Chest: Clear to auscultation bilaterally.  CVS: S1, S2 no murmurs, no S3.Regular rate.  ABD: Soft non tender. Normal BS Ext: one plus left  leg edema no calf tenderness or swelling  MS: Adequate ROM spine, shoulders, hips and knees.  Skin: Intact, no ulcerations or rash noted.  Psych: Good eye contact, normal affect. Memory intact not anxious or depressed appearing.  CNS: CN 2-12 intact, power,  normal throughout.no focal deficits noted.   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 Pt to elevate leg and wear compression hose

## 2016-09-18 NOTE — Assessment & Plan Note (Signed)
Controlled, no change in medication  

## 2016-09-18 NOTE — Assessment & Plan Note (Signed)
No calf swelling , warmth or tenderness. Pt to elevate leg and wear compression hose

## 2016-09-18 NOTE — Telephone Encounter (Signed)
error 

## 2016-09-18 NOTE — Assessment & Plan Note (Signed)
Leukocytosis noted on recent labs, repeat on day of visit shows the same, refer to heme/ onc for further evaluation and management

## 2016-09-22 HISTORY — PX: BREAST EXCISIONAL BIOPSY: SUR124

## 2016-09-28 ENCOUNTER — Emergency Department (HOSPITAL_COMMUNITY): Payer: Medicare Other

## 2016-09-28 ENCOUNTER — Encounter (HOSPITAL_COMMUNITY): Payer: Self-pay

## 2016-09-28 ENCOUNTER — Inpatient Hospital Stay (HOSPITAL_COMMUNITY)
Admission: EM | Admit: 2016-09-28 | Discharge: 2016-10-03 | DRG: 193 | Disposition: A | Discharge status: Home Health | Payer: Medicare Other | Attending: Internal Medicine | Admitting: Internal Medicine

## 2016-09-28 DIAGNOSIS — Z853 Personal history of malignant neoplasm of breast: Secondary | ICD-10-CM

## 2016-09-28 DIAGNOSIS — R935 Abnormal findings on diagnostic imaging of other abdominal regions, including retroperitoneum: Secondary | ICD-10-CM | POA: Diagnosis not present

## 2016-09-28 DIAGNOSIS — R0902 Hypoxemia: Secondary | ICD-10-CM | POA: Diagnosis not present

## 2016-09-28 DIAGNOSIS — I82402 Acute embolism and thrombosis of unspecified deep veins of left lower extremity: Secondary | ICD-10-CM | POA: Diagnosis not present

## 2016-09-28 DIAGNOSIS — D329 Benign neoplasm of meninges, unspecified: Secondary | ICD-10-CM | POA: Diagnosis present

## 2016-09-28 DIAGNOSIS — R9431 Abnormal electrocardiogram [ECG] [EKG]: Secondary | ICD-10-CM | POA: Diagnosis present

## 2016-09-28 DIAGNOSIS — R7303 Prediabetes: Secondary | ICD-10-CM | POA: Diagnosis present

## 2016-09-28 DIAGNOSIS — N183 Chronic kidney disease, stage 3 unspecified: Secondary | ICD-10-CM | POA: Diagnosis present

## 2016-09-28 DIAGNOSIS — E873 Alkalosis: Secondary | ICD-10-CM | POA: Diagnosis present

## 2016-09-28 DIAGNOSIS — Z90710 Acquired absence of both cervix and uterus: Secondary | ICD-10-CM | POA: Diagnosis not present

## 2016-09-28 DIAGNOSIS — I428 Other cardiomyopathies: Secondary | ICD-10-CM | POA: Diagnosis present

## 2016-09-28 DIAGNOSIS — Z888 Allergy status to other drugs, medicaments and biological substances status: Secondary | ICD-10-CM

## 2016-09-28 DIAGNOSIS — J189 Pneumonia, unspecified organism: Secondary | ICD-10-CM | POA: Diagnosis not present

## 2016-09-28 DIAGNOSIS — I248 Other forms of acute ischemic heart disease: Secondary | ICD-10-CM | POA: Diagnosis not present

## 2016-09-28 DIAGNOSIS — R748 Abnormal levels of other serum enzymes: Secondary | ICD-10-CM | POA: Diagnosis not present

## 2016-09-28 DIAGNOSIS — Y95 Nosocomial condition: Secondary | ICD-10-CM | POA: Diagnosis present

## 2016-09-28 DIAGNOSIS — R4701 Aphasia: Secondary | ICD-10-CM | POA: Diagnosis present

## 2016-09-28 DIAGNOSIS — I82401 Acute embolism and thrombosis of unspecified deep veins of right lower extremity: Secondary | ICD-10-CM | POA: Diagnosis not present

## 2016-09-28 DIAGNOSIS — Z7951 Long term (current) use of inhaled steroids: Secondary | ICD-10-CM

## 2016-09-28 DIAGNOSIS — I82409 Acute embolism and thrombosis of unspecified deep veins of unspecified lower extremity: Secondary | ICD-10-CM

## 2016-09-28 DIAGNOSIS — I5043 Acute on chronic combined systolic (congestive) and diastolic (congestive) heart failure: Secondary | ICD-10-CM | POA: Diagnosis not present

## 2016-09-28 DIAGNOSIS — I5022 Chronic systolic (congestive) heart failure: Secondary | ICD-10-CM

## 2016-09-28 DIAGNOSIS — R778 Other specified abnormalities of plasma proteins: Secondary | ICD-10-CM | POA: Diagnosis present

## 2016-09-28 DIAGNOSIS — R7989 Other specified abnormal findings of blood chemistry: Secondary | ICD-10-CM | POA: Diagnosis not present

## 2016-09-28 DIAGNOSIS — Z9011 Acquired absence of right breast and nipple: Secondary | ICD-10-CM

## 2016-09-28 DIAGNOSIS — K7689 Other specified diseases of liver: Secondary | ICD-10-CM | POA: Diagnosis not present

## 2016-09-28 DIAGNOSIS — Z923 Personal history of irradiation: Secondary | ICD-10-CM | POA: Diagnosis not present

## 2016-09-28 DIAGNOSIS — F329 Major depressive disorder, single episode, unspecified: Secondary | ICD-10-CM | POA: Diagnosis present

## 2016-09-28 DIAGNOSIS — Z886 Allergy status to analgesic agent status: Secondary | ICD-10-CM

## 2016-09-28 DIAGNOSIS — K210 Gastro-esophageal reflux disease with esophagitis: Secondary | ICD-10-CM | POA: Diagnosis present

## 2016-09-28 DIAGNOSIS — R0602 Shortness of breath: Secondary | ICD-10-CM | POA: Diagnosis not present

## 2016-09-28 DIAGNOSIS — I509 Heart failure, unspecified: Secondary | ICD-10-CM | POA: Diagnosis not present

## 2016-09-28 DIAGNOSIS — E876 Hypokalemia: Secondary | ICD-10-CM | POA: Diagnosis not present

## 2016-09-28 DIAGNOSIS — F419 Anxiety disorder, unspecified: Secondary | ICD-10-CM | POA: Diagnosis present

## 2016-09-28 DIAGNOSIS — Z800 Family history of malignant neoplasm of digestive organs: Secondary | ICD-10-CM

## 2016-09-28 DIAGNOSIS — I82432 Acute embolism and thrombosis of left popliteal vein: Secondary | ICD-10-CM | POA: Diagnosis not present

## 2016-09-28 DIAGNOSIS — Z9221 Personal history of antineoplastic chemotherapy: Secondary | ICD-10-CM

## 2016-09-28 DIAGNOSIS — N289 Disorder of kidney and ureter, unspecified: Secondary | ICD-10-CM | POA: Diagnosis present

## 2016-09-28 DIAGNOSIS — E785 Hyperlipidemia, unspecified: Secondary | ICD-10-CM | POA: Diagnosis present

## 2016-09-28 DIAGNOSIS — R600 Localized edema: Secondary | ICD-10-CM | POA: Diagnosis present

## 2016-09-28 DIAGNOSIS — R4781 Slurred speech: Secondary | ICD-10-CM | POA: Diagnosis not present

## 2016-09-28 DIAGNOSIS — R739 Hyperglycemia, unspecified: Secondary | ICD-10-CM | POA: Diagnosis present

## 2016-09-28 DIAGNOSIS — E46 Unspecified protein-calorie malnutrition: Secondary | ICD-10-CM | POA: Diagnosis present

## 2016-09-28 DIAGNOSIS — R079 Chest pain, unspecified: Secondary | ICD-10-CM | POA: Diagnosis not present

## 2016-09-28 DIAGNOSIS — R609 Edema, unspecified: Secondary | ICD-10-CM | POA: Diagnosis not present

## 2016-09-28 DIAGNOSIS — I130 Hypertensive heart and chronic kidney disease with heart failure and stage 1 through stage 4 chronic kidney disease, or unspecified chronic kidney disease: Secondary | ICD-10-CM | POA: Diagnosis not present

## 2016-09-28 DIAGNOSIS — R945 Abnormal results of liver function studies: Secondary | ICD-10-CM

## 2016-09-28 DIAGNOSIS — R109 Unspecified abdominal pain: Secondary | ICD-10-CM

## 2016-09-28 DIAGNOSIS — Z8249 Family history of ischemic heart disease and other diseases of the circulatory system: Secondary | ICD-10-CM

## 2016-09-28 DIAGNOSIS — D649 Anemia, unspecified: Secondary | ICD-10-CM | POA: Diagnosis present

## 2016-09-28 DIAGNOSIS — I2489 Other forms of acute ischemic heart disease: Secondary | ICD-10-CM

## 2016-09-28 DIAGNOSIS — R531 Weakness: Secondary | ICD-10-CM | POA: Diagnosis not present

## 2016-09-28 LAB — DIFFERENTIAL
BASOS PCT: 1 %
Basophils Absolute: 0.1 10*3/uL (ref 0.0–0.1)
EOS ABS: 1.3 10*3/uL — AB (ref 0.0–0.7)
EOS PCT: 14 %
LYMPHS PCT: 27 %
Lymphs Abs: 2.4 10*3/uL (ref 0.7–4.0)
MONOS PCT: 5 %
Monocytes Absolute: 0.5 10*3/uL (ref 0.1–1.0)
Neutro Abs: 4.7 10*3/uL (ref 1.7–7.7)
Neutrophils Relative %: 53 %

## 2016-09-28 LAB — COMPREHENSIVE METABOLIC PANEL
ALK PHOS: 425 U/L — AB (ref 38–126)
ALT: 90 U/L — AB (ref 14–54)
AST: 75 U/L — AB (ref 15–41)
Albumin: 3.1 g/dL — ABNORMAL LOW (ref 3.5–5.0)
Anion gap: 10 (ref 5–15)
BUN: 20 mg/dL (ref 6–20)
CALCIUM: 8.3 mg/dL — AB (ref 8.9–10.3)
CHLORIDE: 109 mmol/L (ref 101–111)
CO2: 23 mmol/L (ref 22–32)
CREATININE: 1.47 mg/dL — AB (ref 0.44–1.00)
GFR calc Af Amer: 41 mL/min — ABNORMAL LOW (ref >60)
GFR, EST NON AFRICAN AMERICAN: 35 mL/min — AB (ref >60)
Glucose, Bld: 135 mg/dL — ABNORMAL HIGH (ref 65–99)
Potassium: 2.6 mmol/L — CL (ref 3.5–5.1)
Sodium: 142 mmol/L (ref 135–145)
Total Bilirubin: 1.1 mg/dL (ref 0.3–1.2)
Total Protein: 7.3 g/dL (ref 6.5–8.1)

## 2016-09-28 LAB — I-STAT TROPONIN, ED: TROPONIN I, POC: 0.43 ng/mL — AB (ref 0.00–0.08)

## 2016-09-28 LAB — I-STAT CHEM 8, ED
BUN: 21 mg/dL — AB (ref 6–20)
CALCIUM ION: 1.08 mmol/L — AB (ref 1.15–1.40)
CHLORIDE: 106 mmol/L (ref 101–111)
Creatinine, Ser: 1.3 mg/dL — ABNORMAL HIGH (ref 0.44–1.00)
Glucose, Bld: 138 mg/dL — ABNORMAL HIGH (ref 65–99)
HEMATOCRIT: 37 % (ref 36.0–46.0)
Hemoglobin: 12.6 g/dL (ref 12.0–15.0)
Potassium: 2.6 mmol/L — CL (ref 3.5–5.1)
Sodium: 147 mmol/L — ABNORMAL HIGH (ref 135–145)
TCO2: 25 mmol/L (ref 0–100)

## 2016-09-28 LAB — CBC
HEMATOCRIT: 33 % — AB (ref 36.0–46.0)
HEMOGLOBIN: 10.6 g/dL — AB (ref 12.0–15.0)
MCH: 26.2 pg (ref 26.0–34.0)
MCHC: 32.1 g/dL (ref 30.0–36.0)
MCV: 81.7 fL (ref 78.0–100.0)
PLATELETS: 234 10*3/uL (ref 150–400)
RBC: 4.04 MIL/uL (ref 3.87–5.11)
RDW: 21.6 % — ABNORMAL HIGH (ref 11.5–15.5)
WBC: 9 10*3/uL (ref 4.0–10.5)

## 2016-09-28 LAB — PROTIME-INR
INR: 1.43
Prothrombin Time: 17.6 seconds — ABNORMAL HIGH (ref 11.4–15.2)

## 2016-09-28 LAB — I-STAT ARTERIAL BLOOD GAS, ED
Acid-base deficit: 2 mmol/L (ref 0.0–2.0)
Bicarbonate: 21.1 mmol/L (ref 20.0–28.0)
O2 Saturation: 93 %
PCO2 ART: 28.3 mmHg — AB (ref 32.0–48.0)
PH ART: 7.479 — AB (ref 7.350–7.450)
Patient temperature: 97.6
TCO2: 22 mmol/L (ref 0–100)
pO2, Arterial: 59 mmHg — ABNORMAL LOW (ref 83.0–108.0)

## 2016-09-28 LAB — BRAIN NATRIURETIC PEPTIDE: B Natriuretic Peptide: 780.4 pg/mL — ABNORMAL HIGH (ref 0.0–100.0)

## 2016-09-28 LAB — MAGNESIUM: MAGNESIUM: 2.1 mg/dL (ref 1.7–2.4)

## 2016-09-28 LAB — APTT: aPTT: 34 seconds (ref 24–36)

## 2016-09-28 MED ORDER — PANTOPRAZOLE SODIUM 40 MG PO TBEC
40.0000 mg | DELAYED_RELEASE_TABLET | Freq: Two times a day (BID) | ORAL | Status: DC
  Administered 2016-09-28 – 2016-10-03 (×10): 40 mg via ORAL
  Filled 2016-09-28 (×10): qty 1

## 2016-09-28 MED ORDER — ZOLPIDEM TARTRATE 5 MG PO TABS
5.0000 mg | ORAL_TABLET | Freq: Every evening | ORAL | Status: DC | PRN
  Administered 2016-09-28 – 2016-10-02 (×5): 5 mg via ORAL
  Filled 2016-09-28 (×5): qty 1

## 2016-09-28 MED ORDER — SERTRALINE HCL 50 MG PO TABS: 50.0000 mg | ORAL_TABLET | Freq: Every day | ORAL | Status: DC

## 2016-09-28 MED ORDER — DEXTROSE 5 % IV SOLN
1.0000 g | Freq: Once | INTRAVENOUS | Status: DC
  Filled 2016-09-28: qty 1

## 2016-09-28 MED ORDER — AZITHROMYCIN 250 MG PO TABS: 500.0000 mg | ORAL_TABLET | Freq: Once | ORAL | Status: DC

## 2016-09-28 MED ORDER — VANCOMYCIN HCL 10 G IV SOLR
1500.0000 mg | Freq: Once | INTRAVENOUS | Status: AC
  Administered 2016-09-28: 1500 mg via INTRAVENOUS
  Filled 2016-09-28: qty 1500

## 2016-09-28 MED ORDER — CARVEDILOL 6.25 MG PO TABS: 6.2500 mg | ORAL_TABLET | Freq: Two times a day (BID) | ORAL | Status: DC

## 2016-09-28 MED ORDER — PRAVASTATIN SODIUM 40 MG PO TABS
40.0000 mg | ORAL_TABLET | Freq: Every day | ORAL | Status: DC
  Administered 2016-09-28 – 2016-10-02 (×5): 40 mg via ORAL
  Filled 2016-09-28 (×5): qty 1

## 2016-09-28 MED ORDER — ENOXAPARIN SODIUM 40 MG/0.4ML ~~LOC~~ SOLN
40.0000 mg | SUBCUTANEOUS | Status: DC
  Administered 2016-09-28: 40 mg via SUBCUTANEOUS
  Filled 2016-09-28: qty 0.4

## 2016-09-28 MED ORDER — VANCOMYCIN HCL IN DEXTROSE 750-5 MG/150ML-% IV SOLN
750.0000 mg | Freq: Two times a day (BID) | INTRAVENOUS | Status: DC
  Filled 2016-09-28 (×3): qty 150

## 2016-09-28 MED ORDER — FLUTICASONE PROPIONATE 50 MCG/ACT NA SUSP
2.0000 | Freq: Every day | NASAL | Status: DC
  Filled 2016-09-28: qty 16

## 2016-09-28 MED ORDER — DEXTROSE 5 % IV SOLN
1.0000 g | Freq: Three times a day (TID) | INTRAVENOUS | Status: DC
  Administered 2016-09-29: 1 g via INTRAVENOUS
  Filled 2016-09-28 (×3): qty 1

## 2016-09-28 MED ORDER — SODIUM CHLORIDE 0.9 % IV SOLN
30.0000 meq | Freq: Once | INTRAVENOUS | Status: AC
  Administered 2016-09-28: 30 meq via INTRAVENOUS
  Filled 2016-09-28: qty 15

## 2016-09-28 MED ORDER — LACTATED RINGERS IV SOLN
INTRAVENOUS | Status: DC
  Administered 2016-09-29 – 2016-09-30 (×3): via INTRAVENOUS

## 2016-09-28 MED ORDER — CLONAZEPAM 1 MG PO TABS
1.0000 mg | ORAL_TABLET | Freq: Every day | ORAL | Status: DC
Start: 2016-09-28 — End: 2016-10-03
  Administered 2016-09-28 – 2016-10-02 (×5): 1 mg via ORAL
  Filled 2016-09-28 (×5): qty 1

## 2016-09-28 MED ORDER — POTASSIUM CHLORIDE CRYS ER 20 MEQ PO TBCR
40.0000 meq | EXTENDED_RELEASE_TABLET | Freq: Once | ORAL | Status: AC
  Administered 2016-09-28: 40 meq via ORAL
  Filled 2016-09-28: qty 2

## 2016-09-28 MED ORDER — DOCUSATE SODIUM 100 MG PO CAPS
100.0000 mg | ORAL_CAPSULE | Freq: Two times a day (BID) | ORAL | Status: DC
  Administered 2016-10-01 – 2016-10-03 (×4): 100 mg via ORAL
  Filled 2016-09-28 (×9): qty 1

## 2016-09-28 MED ORDER — DEXTROSE 5 % IV SOLN: 1.0000 g | Freq: Once | INTRAVENOUS | Status: DC

## 2016-09-28 NOTE — ED Notes (Addendum)
Called lab to add on magnesium and BNP

## 2016-09-28 NOTE — ED Notes (Signed)
This RN attempted IV X2 unsuccessful X2. Will get another RN To attempt.

## 2016-09-28 NOTE — H&P (Signed)
History and Physical    Tara [name] XKG:[id number] DOB: [birth year] DOA: 09/28/2016  PCP: [name], MD Consultants:  Rosalita Levan - hematology, appt 1/16; Allred - cardiology Patient coming from: home - lives with husband; NOK: husband, [phone]  Chief Complaint: weakness  HPI: Tara [name] is a 71 y.o. female with medical history significant of remote breast cancer, nonischemic cardiomyopathy, and GERD presenting with generalized weakness.  Her husband reports slurring of speech, falling down, weak.  She says she felt like she was catching a cold.  Did not have balance, has to walk really slow.  No fever.  +cough, nonproductive.  Cough for a couple of days.  No sick contacts.  Denies h/o similar, no h/o hypokalemia.   Not usually on O2, has not been feeling SOB.  Her husband reports that she was sent to [facility] about 2 months ago for bowel issue (?obstruction), was told she had PNA, hospitalized for a week.  Review of Care Everywhere in [place] shows hospitalization from 10/26-11/1.  She was admitted with 2 months of n/v, abdominal distention, and inability to tolerate PO and was found to have esophagitis/duodenitis and pyloric stenosis. She had pyloric dilation and was placed on PPI therapy BID for at least 12 weeks.  A CXR performed during the hospitalization showed patchyy airspace opacities in the lower lobes, L>R, concerning for PNA and aspiration.  It is not clear if she was treated for this as an inpatient, but she does not appear to have been discharged on antibiotics.  ED Course:  Per Dr. Tomi Bamberger: Patient's ED evaluation is notable for a chest x-ray suggesting a pneumonia. She has hypoxia without respiratory acidosis noted on ABG. Electrolyte panel was notable for hypokalemia. She does have a slight elevation in her troponin as well. She denies any chest pain and I doubt acute cardiac ischemia at this time. CT scan of the brain does not show any acute findings. In the emergency room I  do not notice any aphasia or dysarthria. I suspect the speech difficulties noted by her husband may been related to her generalized weakness and illness. I have started her on antibiotics to cover for healthcare associated pneumonia. Records indicate that she was admitted to the hospital the end of October. Consult with medical service for admission and further treatment   Review of Systems: As per HPI; otherwise 10 point review of systems reviewed and negative.   Ambulatory Status:  ambulaltes without assistance  Past Medical History:  Diagnosis Date  . Anxiety   . Breast cancer [facility]) 2008   right - s/p lumpectomy->chemo, radiation  . Depression   . Dysrhythmia    hx SVT  . GERD (gastroesophageal reflux disease)   . H/O: hysterectomy   . Hiatal hernia   . History of cancer chemotherapy   . History of radiation therapy   . Hyperlipidemia   . Nonischemic cardiomyopathy ([place])   . SVT (supraventricular tachycardia) ([place])    short RP SVT documented 5/14  . Systolic CHF [facility])     Past Surgical History:  Procedure Laterality Date  . ABDOMINAL HYSTERECTOMY  1994   fibroids,   . BIOPSY  06/19/2016   Procedure: BIOPSY;  Surgeon: Daneil Dolin, MD;  Location: [facility] ENDO SUITE;  Service: Endoscopy;;  gastric duodenum  . BREAST SURGERY Right 2008   lumpectomy, cancer  . CHOLECYSTECTOMY  1999  . COLONOSCOPY  2008   Dr. Oneida Alar: multiple polyps. Path not available at time of visit.   Marland Kitchen  COLONOSCOPY WITH PROPOFOL N/A 04/25/2014   Dr. Oneida Alar: Multiple tubular adenomas removed. Diverticulosis. Moderate internal hemorrhoids. Next colonoscopy planned for August 2018.  Marland Kitchen ESOPHAGOGASTRODUODENOSCOPY (EGD) WITH PROPOFOL N/A 06/19/2016   Procedure: ESOPHAGOGASTRODUODENOSCOPY (EGD) WITH PROPOFOL;  Surgeon: Daneil Dolin, MD;  Location: [facility] ENDO SUITE;  Service: Endoscopy;  Laterality: N/A;  . POLYPECTOMY N/A 04/25/2014   Procedure: POLYPECTOMY;  Surgeon: Danie Binder, MD;  Location: [facility] ORS;  Service:  Endoscopy;  Laterality: N/A;  Ascending and Decending Colon x3 , Transverse colon x2, rectal    Social History   Social History  . Marital status: Married    Spouse name: N/A  . Number of children: 2  . Years of education: N/A   Occupational History  . disabled  Unemployed   Social History Main Topics  . Smoking status: Never Smoker  . Smokeless tobacco: Never Used  . Alcohol use No  . Drug use: No  . Sexual activity: Yes    Birth control/ protection: Surgical   Other Topics Concern  . Not on file   Social History Narrative   Lives in [place] with family.  Does not routinely exercise.    Allergies  Allergen Reactions  . Aspirin Other (See Comments)    Reports GI bleed with daily use  . Lisinopril Cough  . Sudafed [Pseudoephedrine Hcl]     Pt reports she had drainage in throat that made her throat hurt.     Family History  Problem Relation Age of Onset  . Colon cancer Father 12    died at age 48  . Hypertension Sister   . Dementia Mother   . Cancer Brother 70    prostate    Prior to Admission medications   Medication Sig Start Date End Date Taking? Authorizing Provider  azelastine (ASTELIN) 0.1 % nasal spray Place 2 sprays into both nostrils 2 (two) times daily. Use in each nostril as directed Patient taking differently: Place 2 sprays into both nostrils 2 (two) times daily as needed for allergies. Use in each nostril as directed 10/09/15   [name], MD  carvedilol (COREG) 6.25 MG tablet Take 1 tablet (6.25 mg total) by mouth 2 (two) times daily with a meal. 07/03/16   [name], MD  clonazePAM (KLONOPIN) 0.5 MG tablet TAKE (1) TABLET BY MOUTH AT BEDTIME. 08/20/16   [name], MD  docusate sodium (COLACE) 100 MG capsule Take 100 mg by mouth 2 (two) times daily.    Historical Provider, MD  fexofenadine (ALLEGRA) 180 MG tablet Take 180 mg by mouth daily as needed for allergies.     Historical Provider, MD  fluticasone (FLONASE) 50  MCG/ACT nasal spray Place 2 sprays into both nostrils daily. 11/26/15   [name], MD  lovastatin (MEVACOR) 40 MG tablet Take 1 tablet (40 mg total) by mouth at bedtime. 09/18/16   [name], MD  pantoprazole (PROTONIX) 40 MG tablet Take 1 tablet (40 mg total) by mouth 2 (two) times daily. Patient taking differently: Take 40 mg by mouth daily.  07/10/16   [name], MD  sertraline (ZOLOFT) 50 MG tablet Take 1 tablet (50 mg total) by mouth daily. 07/10/16   [name], MD  zolpidem (AMBIEN) 10 MG tablet Take 1 tablet (10 mg total) by mouth at bedtime as needed for sleep. 03/03/16   [name], MD    Physical Exam: Vitals:   09/28/16 7829 09/28/16 1930 09/28/16 1945 09/28/16 2101  BP: 154/99 143/83 144/84 (!) 157/85  Pulse: (!) 58 (!) 59 60 68  Resp: _0 Temp:    97.7 F (36.5 C)  TempSrc:    Oral  SpO2: 96% 94% 92% 92%  Weight:    87.1 kg (192 lb 1.6 oz)     General: Appears calm and comfortable and is NAD Eyes:  PERRL, EOMI, normal lids, iris ENT:  grossly normal hearing, lips & tongue, mmm Neck:  no LAD, masses or thyromegaly Cardiovascular:  RRR, no m/r/g.   Respiratory: CTA bilaterally, no w/r/r. Normal respiratory effort. Abdomen:  soft, ntnd, NABS Skin:  no rash or induration seen on limited exam Musculoskeletal: grossly normal tone BUE/BLE, good ROM, no bony abnormality; LLE with 2-3+ pitting edema, much greater than the right - patient and her husband report that it has been this way for about a month Psychiatric:  grossly normal mood and affect, speech fluent and appropriate, AOx3 Neurologic:  CN 2-12 grossly intact, moves all extremities in coordinated fashion, sensation intact  Labs on Admission: I have personally reviewed following labs and imaging studies  CBC:  Recent Labs Lab 09/28/16 1605 09/28/16 1624  WBC 9.0  --   NEUTROABS 4.7  --   HGB 10.6* 12.6  HCT 33.0* 37.0  MCV 81.7  --   PLT 234  --    Basic  Metabolic Panel:  Recent Labs Lab 09/28/16 1605 09/28/16 1624  NA 142 147*  K 2.6* 2.6*  CL 109 106  CO2 23  --   GLUCOSE 135* 138*  BUN 20 21*  CREATININE 1.47* 1.30*  CALCIUM 8.3*  --   MG 2.1  --    GFR: Estimated Creatinine Clearance: 36.9 mL/min (by C-G formula based on SCr of 1.3 mg/dL (H)). Liver Function Tests:  Recent Labs Lab 09/28/16 1605  AST 75*  ALT 90*  ALKPHOS 425*  BILITOT 1.1  PROT 7.3  ALBUMIN 3.1*   No results for input(s): LIPASE, AMYLASE in the last 168 hours. No results for input(s): AMMONIA in the last 168 hours. Coagulation Profile:  Recent Labs Lab 09/28/16 1605  INR 1.43   Cardiac Enzymes: No results for input(s): CKTOTAL, CKMB, CKMBINDEX, TROPONINI in the last 168 hours. BNP (last 3 results) No results for input(s): PROBNP in the last 8760 hours. HbA1C: No results for input(s): HGBA1C in the last 72 hours. CBG: No results for input(s): GLUCAP in the last 168 hours. Lipid Profile: No results for input(s): CHOL, HDL, LDLCALC, TRIG, CHOLHDL, LDLDIRECT in the last 72 hours. Thyroid Function Tests: No results for input(s): TSH, T4TOTAL, FREET4, T3FREE, THYROIDAB in the last 72 hours. Anemia Panel: No results for input(s): VITAMINB12, FOLATE, FERRITIN, TIBC, IRON, RETICCTPCT in the last 72 hours. Urine analysis:    Component Value Date/Time   COLORURINE AMBER (A) 06/18/2016 1108   APPEARANCEUR CLOUDY (A) 06/18/2016 1108   LABSPEC >1.030 (H) 06/18/2016 1108   PHURINE 6.0 06/18/2016 1108   GLUCOSEU NEGATIVE 06/18/2016 1108   HGBUR NEGATIVE 06/18/2016 1108   BILIRUBINUR LARGE (A) 06/18/2016 1108   KETONESUR 15 (A) 06/18/2016 1108   PROTEINUR 100 (A) 06/18/2016 1108   UROBILINOGEN 0.2 02/07/2013 1859   NITRITE NEGATIVE 06/18/2016 1108   LEUKOCYTESUR NEGATIVE 06/18/2016 1108    Creatinine Clearance: Estimated Creatinine Clearance: 36.9 mL/min (by C-G formula based on SCr of 1.3 mg/dL (H)).  Sepsis  Labs: _1 (procalcitonin:4,lacticidven:4) )No results found for this or any previous visit (from the past 240 hour(s)).  Radiological Exams on Admission: Dg Chest 2 View  Result Date: 09/28/2016 CLINICAL DATA:  71 year old with acute onset of shortness of breath and generalized weakness that began yesterday, now associated with slurred speech. Personal history of right breast cancer post lumpectomy in 2008. EXAM: CHEST  2 VIEW COMPARISON:  08/25/2016, 06/22/2016 and earlier. FINDINGS: AP semi-erect and lateral images were obtained. Cardiac silhouette mildly to moderately enlarged, unchanged when allowing for differences in technique. New focal opacity in the superior segment right lower lobe since the examination 1 month ago. Lungs otherwise clear. No pleural effusions. Normal pulmonary vascularity. Calcified right paratracheal and right hilar lymph nodes again noted. Visualized bony thorax intact. IMPRESSION: 1. Acute pneumonia suspected involving the superior segment of the right lower lobe. 2. Stable cardiomegaly without pulmonary edema. 3. Calcified right paratracheal and right hilar lymph nodes indicating old granulomatous disease. Electronically Signed   By: Evangeline Dakin M.D.   On: 09/28/2016 18:17   Ct Head Wo Contrast  Result Date: 09/28/2016 CLINICAL DATA:  Slurred speech. History of meningioma of the planum sphenoidale region. EXAM: CT HEAD WITHOUT CONTRAST TECHNIQUE: Contiguous axial images were obtained from the base of the skull through the vertex without intravenous contrast. COMPARISON:  Brain MRI March 05, 2016 FINDINGS: Brain: The ventricles are normal in size and configuration. The previously noted meningioma in the planum sphenoidale region is again noted, less than optimally seen on noncontrast enhanced study. It currently measures 2.9 x 2.3 x 1.8 cm, slightly larger than on prior study. There is no surrounding edema, and this meningioma does not cause significant mass effect.  There is no other mass evident. There is no hemorrhage, extra-axial fluid collection, or midline shift. Mild basal ganglia calcification bilaterally is felt to be physiologic in this age group. No evident acute infarct. Vascular: There is no vascular calcification evident. No hyperdense vessel. Skull: The bony calvarium appears intact. Sinuses/Orbits: Visualized paranasal sinuses are clear except for modest mucosal thickening in the right sphenoid sinus region. Frontal sinuses are hypoplastic. Visualized orbits appear symmetric bilaterally. Other: Mastoid air cells are clear. There is debris in both external auditory canals. IMPRESSION: Planum sphenoidale region meningioma again noted, modestly increased in size from approximately 10 years prior. No surrounding edema. No new mass evident. No hemorrhage, extra-axial fluid collection, or midline shift. No evidence suggesting acute infarct. Debris in each external auditory canal, likely cerumen. Minimal right sphenoid sinus disease. Electronically Signed   By: Lowella Grip III M.D.   On: 09/28/2016 17:34    EKG: Independently reviewed.  NSR with rate 63; nonspecific ST changes with no evidence of acute ischemia, critically prolonged QTc at 607(!)  Assessment/Plan Principal Problem:   HCAP (healthcare-associated pneumonia) Active Problems:   Nonischemic cardiomyopathy (HCC)   Prediabetes   Leg edema, left   Acute respiratory alkalosis   Hypokalemia   Prolonged Q-T interval on ECG   Chronic kidney disease (CKD), stage III (moderate)   Elevated alkaline phosphatase level   Elevated LFTs   Elevated troponin   Elevated brain natriuretic peptide (BNP) level   Anemia   HCAP -Given cough, mildly decreased oxygen saturation, and infiltrate in right lower lobe on chest x-ray , most likely pneumonia.   -She was hospitalized for several days in October and so this would be considered HCAP. -Will start Cefepime and Vanc as per protocol. -LR @  75cc/hr - Repeat CBC in am - Sputum cultures - Blood cultures  Acute respiratory alkalosis  -This abnormality causes light-headedness, confusion, paresthesias,  cramps, and syncope; this could be the potential cause of the patient's weakness (although there are other reasons for this, as well). -The most likely etiologies in this case would be anxiety, hyperventilation syndrome, PE, and CHF. -Pneumonia would generally not be associated with this abnormality. -Will continue to explore these potential issues for now.  Severe hypokalemia -K 2.6 -She is not taking diuretics and does not report GI losses, so it is not entirely clear why she has this. -Her magnesium level is normal (2.1) -She was given 30 mEq IV KCl in the ER and has been given an additional 45mq PO, which would be expected to raise her K+ to 3.3 -Will recheck BMP in AM  Prolonged QT -This is an impressive prolongation and is likely related to her hypokalemia -Will recheck EKG in the AM once her K+ is corrected -Will attempt to avoid QT-prolonging agents -Cardiology suggests holding the beta blocker as well as the Zoloft for now  CKD -Creatinine 1.47 (prior was 1.22) -Will follow   Hyperglycemia -Glucose 135 -Will trend, no intervention for now  Elevated LFTs -AST 75/Alt 90 are mildly elevated (normal LFTs on 9/30) -Alk Phos is markedly elevated at 425 (75 on 9/30) -Interestingly, her bilirubin is normal -While the Alk Phos elevation may be related to a liver process, bone is also a consideration; will check a GGT to help assess for cholestasis as an indication of liver disease -If present from a liver source, further imaging may be warranted -However, with her h/o breast cancer and now rapid and marked elevation in her Alk Phos that developed in just a few months, bony metastatic disease would also be a concern. -Her calcium is on the low side and so could be an indication of secondary hyperparathyroidism which  could also potentially lead to increased AP - but with her calcium so mildly affected the AP as high as it, this seems less likely. -Will repeat LFTs in the AM to ensure that she is not developing fulminant liver failure  Elevated troponin -She also has an elevated troponin of uncertain significance -She does not c/o chest pain and so this may be demand ischemia in the setting of the other acute problems that she has at this time -Her EKG is unremarkable other than the marked QTc prolongation -Her troponin is continuing to increase somewhat and we will continue to trend this -Her BNP is also elevated - 780.4 as compared to 227 on 6/25, but she has unilateral LE edema, no crackles on PE, and no edema seen on CXR. -Her Echo in 6/07 did show a reduced EF of 35-40%, so there may be a CHF component contributing to her issues; will order repeat Echo. -I called to discuss patient with cardiology to ensure that there is no acute treatment needed - he is not overly concerned about her troponin.  He is more impressed by the QT prolongation, which is clearly increased from prior. -Likely demand ischemia in the setting of the other underlying process(es) and does not require intervention at this time.  Malnutrition -Albumin 3.1 -Consider nutrition evaluation  Anemia -Hgb 10.6, which is actually improved from prior -Will follow  LLE edema -Will order DVT [place]- although it is not warm, negative Homan's, negative squeeze, and has been this way for a month now.  -If DVT is present, she will need a CTA to assess for PE since this can be a cause for resp alkalosis  DVT prophylaxis: Lovenox Code Status:  Full -  confirmed with patient/family Family Communication: Husband present throughout evaluation Disposition Plan:  Home once clinically improved Consults called: Cardiology (telephone only)  Admission status: Admit - It is my clinical opinion that admission to INPATIENT is reasonable and necessary because  this patient will require at least 2 midnights in the hospital to treat this condition based on the medical complexity of the problems presented.  Given the aforementioned information, the predictability of an adverse outcome is felt to be significant.    Karmen Bongo MD [facility]  If 7PM-7AM, please contact night-coverage [email or web address] Password [email or web address]  09/29/2016, 1:07 AM

## 2016-09-28 NOTE — ED Notes (Signed)
Attempted report, Charge to call me back when new bed is assigned. Pt status changed from bed ready to waiting to approval from charge.

## 2016-09-28 NOTE — ED Notes (Signed)
Attempted report 

## 2016-09-28 NOTE — Progress Notes (Addendum)
Pharmacy Antibiotic Note  Tara [name] is a 71 y.o. female admitted on 09/28/2016 with HCAP.  Pharmacy has been consulted for vancomycin dosing. Also has cefepime ordered per MD. Afebrile, WBC 10.7. SCr 1.39, Normalized CrCl~43. Wt 87 kg.   Plan: Adjust Vancomycin to 1250 mg IV q24 for goal VT 15 - 20 mcg/ml Change to Cefepime 2 gm IV q24 Monitor clinical progress, c/s, renal function, abx plan/LOT Vancomycin trough as indicated    Temp (24hrs), Avg:97.9 F (36.6 C), Min:97.9 F (36.6 C), Max:97.9 F (36.6 C)   Recent Labs Lab 09/28/16 1605 09/28/16 1624  WBC 9.0  --   CREATININE 1.47* 1.30*    CrCl cannot be calculated (Unknown ideal weight.).    Allergies  Allergen Reactions  . Aspirin Other (See Comments)    Reports GI bleed with daily use  . Lisinopril Cough  . Sudafed [Pseudoephedrine Hcl]     Pt reports she had drainage in throat that made her throat hurt.     Antimicrobials this admission:  Vanc 1/8>> Cefepime 1/8>> Dose adjustments this admission:    Microbiology results:  1/7 BCx2>> ip 1/8 S pneumo Ug neg  Eudelia Bunch, Pharm.D. QP:[id number] 09/29/2016 10:34 AM

## 2016-09-28 NOTE — ED Notes (Signed)
ED Provider at bedside. 

## 2016-09-28 NOTE — ED Notes (Signed)
Patient transported to CT 

## 2016-09-28 NOTE — ED Triage Notes (Signed)
Pt complaining of generalized weakness and malaise. Pt complaining of "feeling unbalanced." Per family, pt slurring words since yesterday. Pt denies any cough or chest pain. Pt denies any abdominal pain or nausea/vomiting.

## 2016-09-28 NOTE — ED Notes (Signed)
Respiratory at bedside. Pt returned to room from CT at placed back on monitor.

## 2016-09-28 NOTE — ED Provider Notes (Signed)
[place] DEPT Provider Note   CSN: [id number] Arrival date & time: 09/28/16  1544     History   Chief Complaint Chief Complaint  Patient presents with  . Weakness  . Aphasia    HPI Tara [name] is a 70 y.o. female.  HPI History is provided by husband.  Pt has been falling, speech has been slurred has been feeling weak.  She started feeling weak about 1 week ago.  The sx were mild but gradually getting worse.  Last night and today it got much worse and her speech difficulties started.  When she tried to speak her words were slurred and hard to understand.    She seems to be all over weak, not just one side.  Pt states she has been short of breath.  She has been coughing, mostly dry.   No chest pain.  She has noticed swelling in her left leg.  It started a few weeks ago.  She saw her doctor and was examined.  No further testing was done  Her symptoms were much more severe today so she came to the ED Past Medical History:  Diagnosis Date  . Anxiety   . Breast cancer [facility]) 2008   right - s/p lumpectomy->chemo, radiation  . Depression   . Dysrhythmia    hx SVT  . GERD (gastroesophageal reflux disease)   . H/O: hysterectomy   . Hiatal hernia   . History of cancer chemotherapy   . History of radiation therapy   . Hyperlipidemia   . Nonischemic cardiomyopathy ([place])   . SVT (supraventricular tachycardia) ([place])    short RP SVT documented 5/14  . Systolic CHF [facility])     Patient Active Problem List   Diagnosis Date Noted  . Leukocytosis 09/18/2016  . Leg edema, left 09/18/2016  . Nausea with vomiting 07/17/2016  . Abdominal pain 07/17/2016  . Hospital discharge follow-up 07/06/2016  . Duodenitis   . Gastric distention   . Abnormal CT scan, esophagus   . Upper airway cough syndrome  vs Cough variant asthma  04/09/2016  . H/O fall 03/23/2016  . Chronic coughing 03/17/2016  . Poor vision 12/11/2014  . Seasonal allergies 08/08/2014  . Family history of colon cancer  03/30/2014  . Osteoporosis 02/14/2014  . Prediabetes 02/05/2014  . Metabolic syndrome X [id number]  . SVT (supraventricular tachycardia) ([place]) 03/14/2013  . Nonischemic cardiomyopathy ([place]) 03/14/2013  . Vitamin D deficiency 07/23/2010  . Obesity, Class II, BMI 35-39.9, with comorbidity 07/04/2009  . Malignant neoplasm of right breast ([place]) 12/13/2007  . Hyperlipidemia 12/13/2007  . ANXIETY, CHRONIC 12/13/2007  . DEPRESSION 12/13/2007  . GERD 12/13/2007    Past Surgical History:  Procedure Laterality Date  . ABDOMINAL HYSTERECTOMY  1994   fibroids,   . BIOPSY  06/19/2016   Procedure: BIOPSY;  Surgeon: Daneil Dolin, MD;  Location: [facility] ENDO SUITE;  Service: Endoscopy;;  gastric duodenum  . BREAST SURGERY Right 2008   lumpectomy, cancer  . CHOLECYSTECTOMY  1999  . COLONOSCOPY  2008   Dr. Oneida Alar: multiple polyps. Path not available at time of visit.   Marland Kitchen COLONOSCOPY WITH PROPOFOL N/A 04/25/2014   Dr. Oneida Alar: Multiple tubular adenomas removed. Diverticulosis. Moderate internal hemorrhoids. Next colonoscopy planned for August 2018.  Marland Kitchen ESOPHAGOGASTRODUODENOSCOPY (EGD) WITH PROPOFOL N/A 06/19/2016   Procedure: ESOPHAGOGASTRODUODENOSCOPY (EGD) WITH PROPOFOL;  Surgeon: Daneil Dolin, MD;  Location: [facility] ENDO SUITE;  Service: Endoscopy;  Laterality: N/A;  . POLYPECTOMY N/A 04/25/2014   Procedure:  POLYPECTOMY;  Surgeon: Danie Binder, MD;  Location: [facility] ORS;  Service: Endoscopy;  Laterality: N/A;  Ascending and Decending Colon x3 , Transverse colon x2, rectal    OB History    No data available       Home Medications    Prior to Admission medications   Medication Sig Start Date End Date Taking? Authorizing Provider  azelastine (ASTELIN) 0.1 % nasal spray Place 2 sprays into both nostrils 2 (two) times daily. Use in each nostril as directed Patient taking differently: Place 2 sprays into both nostrils 2 (two) times daily as needed for allergies. Use in each nostril as directed 10/09/15    [name], MD  carvedilol (COREG) 6.25 MG tablet Take 1 tablet (6.25 mg total) by mouth 2 (two) times daily with a meal. 07/03/16   [name], MD  clonazePAM (KLONOPIN) 0.5 MG tablet TAKE (1) TABLET BY MOUTH AT BEDTIME. 08/20/16   [name], MD  docusate sodium (COLACE) 100 MG capsule Take 100 mg by mouth 2 (two) times daily.    Historical Provider, MD  fexofenadine (ALLEGRA) 180 MG tablet Take 180 mg by mouth daily as needed for allergies.     Historical Provider, MD  fluticasone (FLONASE) 50 MCG/ACT nasal spray Place 2 sprays into both nostrils daily. 11/26/15   [name], MD  lovastatin (MEVACOR) 40 MG tablet Take 1 tablet (40 mg total) by mouth at bedtime. 09/18/16   [name], MD  pantoprazole (PROTONIX) 40 MG tablet Take 1 tablet (40 mg total) by mouth 2 (two) times daily. Patient taking differently: Take 40 mg by mouth daily.  07/10/16   [name], MD  sertraline (ZOLOFT) 50 MG tablet Take 1 tablet (50 mg total) by mouth daily. 07/10/16   [name], MD  zolpidem (AMBIEN) 10 MG tablet Take 1 tablet (10 mg total) by mouth at bedtime as needed for sleep. 03/03/16   [name], MD    Family History Family History  Problem Relation Age of Onset  . Colon cancer Father 85    died at age 82  . Hypertension Sister   . Cancer Brother 37    prostate    Social History Social History  Substance Use Topics  . Smoking status: Never Smoker  . Smokeless tobacco: Never Used  . Alcohol use No     Allergies   Aspirin; Lisinopril; and Sudafed [pseudoephedrine hcl]   Review of Systems Review of Systems  All other systems reviewed and are negative.    Physical Exam Updated Vital Signs BP 143/76   Pulse 63   Temp 97.9 F (36.6 C)   Resp 22   SpO2 97%   Physical Exam  Constitutional: No distress.  HENT:  Head: Normocephalic and atraumatic.  Right Ear: External ear normal.  Left Ear: External ear normal.    Mouth/Throat: No oropharyngeal exudate.  Eyes: Conjunctivae are normal. Right eye exhibits no discharge. Left eye exhibits no discharge. No scleral icterus.  Neck: Neck supple. No tracheal deviation present.  Cardiovascular: Normal rate, regular rhythm and intact distal pulses.   Pulmonary/Chest: Effort normal and breath sounds normal. No stridor. No respiratory distress. She has no wheezes. She has no rales.  Abdominal: Soft. Bowel sounds are normal. She exhibits no distension. There is no tenderness. There is no rebound and no guarding.  Musculoskeletal: She exhibits no edema or tenderness.  Trace edema bilateral le  Neurological: She is alert. No cranial  nerve deficit (no facial droop, extraocular movements intact, no slurred speech) or sensory deficit. She exhibits normal muscle tone. She displays no seizure activity. Coordination normal. GCS eye subscore is 4. GCS verbal subscore is 4. GCS motor subscore is 6.  Disoriented to the date, generalized weakness but able to grip equally with both hands, lift both legs off the bed  Skin: Skin is warm and dry. No rash noted. She is not diaphoretic.  Psychiatric: She has a normal mood and affect.  Nursing note and vitals reviewed.    ED Treatments / Results  Labs (all labs ordered are listed, but only abnormal results are displayed) Labs Reviewed  PROTIME-INR - Abnormal; Notable for the following:       Result Value   Prothrombin Time 17.6 (*)    All other components within normal limits  CBC - Abnormal; Notable for the following:    Hemoglobin 10.6 (*)    HCT 33.0 (*)    RDW 21.6 (*)    All other components within normal limits  DIFFERENTIAL - Abnormal; Notable for the following:    Eosinophils Absolute 1.3 (*)    All other components within normal limits  COMPREHENSIVE METABOLIC PANEL - Abnormal; Notable for the following:    Potassium 2.6 (*)    Glucose, Bld 135 (*)    Creatinine, Ser 1.47 (*)    Calcium 8.3 (*)    Albumin 3.1  (*)    AST 75 (*)    ALT 90 (*)    Alkaline Phosphatase 425 (*)    GFR calc non Af Amer 35 (*)    GFR calc Af Amer 41 (*)    All other components within normal limits  I-STAT TROPOININ, ED - Abnormal; Notable for the following:    Troponin i, poc 0.43 (*)    All other components within normal limits  I-STAT CHEM 8, ED - Abnormal; Notable for the following:    Sodium 147 (*)    Potassium 2.6 (*)    BUN 21 (*)    Creatinine, Ser 1.30 (*)    Glucose, Bld 138 (*)    Calcium, Ion 1.08 (*)    All other components within normal limits  I-STAT ARTERIAL BLOOD GAS, ED - Abnormal; Notable for the following:    pH, Arterial 7.479 (*)    pCO2 arterial 28.3 (*)    pO2, Arterial 59.0 (*)    All other components within normal limits  APTT  BRAIN NATRIURETIC PEPTIDE  MAGNESIUM  CBG MONITORING, ED    EKG  EKG Interpretation None       Radiology Dg Chest 2 View  Result Date: 09/28/2016 CLINICAL DATA:  71 year old with acute onset of shortness of breath and generalized weakness that began yesterday, now associated with slurred speech. Personal history of right breast cancer post lumpectomy in 2008. EXAM: CHEST  2 VIEW COMPARISON:  08/25/2016, 06/22/2016 and earlier. FINDINGS: AP semi-erect and lateral images were obtained. Cardiac silhouette mildly to moderately enlarged, unchanged when allowing for differences in technique. New focal opacity in the superior segment right lower lobe since the examination 1 month ago. Lungs otherwise clear. No pleural effusions. Normal pulmonary vascularity. Calcified right paratracheal and right hilar lymph nodes again noted. Visualized bony thorax intact. IMPRESSION: 1. Acute pneumonia suspected involving the superior segment of the right lower lobe. 2. Stable cardiomegaly without pulmonary edema. 3. Calcified right paratracheal and right hilar lymph nodes indicating old granulomatous disease. Electronically Signed   By: Evangeline Dakin  M.D.   On: 09/28/2016  18:17   Ct Head Wo Contrast  Result Date: 09/28/2016 CLINICAL DATA:  Slurred speech. History of meningioma of the planum sphenoidale region. EXAM: CT HEAD WITHOUT CONTRAST TECHNIQUE: Contiguous axial images were obtained from the base of the skull through the vertex without intravenous contrast. COMPARISON:  Brain MRI March 05, 2016 FINDINGS: Brain: The ventricles are normal in size and configuration. The previously noted meningioma in the planum sphenoidale region is again noted, less than optimally seen on noncontrast enhanced study. It currently measures 2.9 x 2.3 x 1.8 cm, slightly larger than on prior study. There is no surrounding edema, and this meningioma does not cause significant mass effect. There is no other mass evident. There is no hemorrhage, extra-axial fluid collection, or midline shift. Mild basal ganglia calcification bilaterally is felt to be physiologic in this age group. No evident acute infarct. Vascular: There is no vascular calcification evident. No hyperdense vessel. Skull: The bony calvarium appears intact. Sinuses/Orbits: Visualized paranasal sinuses are clear except for modest mucosal thickening in the right sphenoid sinus region. Frontal sinuses are hypoplastic. Visualized orbits appear symmetric bilaterally. Other: Mastoid air cells are clear. There is debris in both external auditory canals. IMPRESSION: Planum sphenoidale region meningioma again noted, modestly increased in size from approximately 10 years prior. No surrounding edema. No new mass evident. No hemorrhage, extra-axial fluid collection, or midline shift. No evidence suggesting acute infarct. Debris in each external auditory canal, likely cerumen. Minimal right sphenoid sinus disease. Electronically Signed   By: Lowella Grip III M.D.   On: 09/28/2016 17:34    Procedures Procedures (including critical care time)  Medications Ordered in ED Medications  potassium chloride 30 mEq in sodium chloride 0.9 % 265 mL  (KCL MULTIRUN) IVPB (not administered)  ceFEPIme (MAXIPIME) 1 g in dextrose 5 % 50 mL IVPB (not administered)     Initial Impression / Assessment and Plan / ED Course  I have reviewed the triage vital signs and the nursing notes.  Pertinent labs & imaging results that were available during my care of the patient were reviewed by me and considered in my medical decision making (see chart for details).  Clinical Course as of Sep 28 1837  Nancy Fetter Sep 28, 2016  1628 Hypokalemia noted  [JK]  1742 Hypoxia noted at the bedside.   Improved with supplemental oxygen.  Pt is not usually on home o2.  Potassium IV ordered for hypokalemia.  Elevated troponin, pt denies chest pain.  CXR pending  [JK]    Clinical Course User Index [JK] Dorie Rank, MD   Patient's ED evaluation is notable for a chest x-ray suggesting a pneumonia. She has hypoxia without respiratory acidosis noted on ABG. Electrolyte panel was notable for hypokalemia.  She does have a slight elevation in her troponin as well. She denies any chest pain and I doubt acute cardiac ischemia at this time. CT scan of the brain does not show any acute findings. In the emergency room I do not notice any aphasia or dysarthria.  I suspect the speech difficulties noted by her husband may been related to her generalized weakness and illness. I have started her on antibiotics to cover for healthcare associated pneumonia. Records indicate that she was admitted to the hospital the end of October. Consult with medical service for admission and further treatment   Final Clinical Impressions(s) / ED Diagnoses   Final diagnoses:  Hypokalemia  HCAP (healthcare-associated pneumonia)    New Prescriptions New Prescriptions  No medications on file     Dorie Rank, MD 09/28/16 [phone]

## 2016-09-29 ENCOUNTER — Inpatient Hospital Stay (HOSPITAL_COMMUNITY): Payer: Medicare Other

## 2016-09-29 ENCOUNTER — Encounter (HOSPITAL_COMMUNITY): Payer: Self-pay | Admitting: Radiology

## 2016-09-29 DIAGNOSIS — D649 Anemia, unspecified: Secondary | ICD-10-CM | POA: Diagnosis present

## 2016-09-29 DIAGNOSIS — R609 Edema, unspecified: Secondary | ICD-10-CM

## 2016-09-29 DIAGNOSIS — R9431 Abnormal electrocardiogram [ECG] [EKG]: Secondary | ICD-10-CM | POA: Diagnosis present

## 2016-09-29 DIAGNOSIS — E873 Alkalosis: Secondary | ICD-10-CM | POA: Diagnosis present

## 2016-09-29 DIAGNOSIS — E876 Hypokalemia: Secondary | ICD-10-CM | POA: Diagnosis present

## 2016-09-29 DIAGNOSIS — R778 Other specified abnormalities of plasma proteins: Secondary | ICD-10-CM | POA: Diagnosis present

## 2016-09-29 DIAGNOSIS — N183 Chronic kidney disease, stage 3 unspecified: Secondary | ICD-10-CM | POA: Diagnosis present

## 2016-09-29 DIAGNOSIS — R748 Abnormal levels of other serum enzymes: Secondary | ICD-10-CM | POA: Diagnosis present

## 2016-09-29 DIAGNOSIS — R945 Abnormal results of liver function studies: Secondary | ICD-10-CM

## 2016-09-29 DIAGNOSIS — R7989 Other specified abnormal findings of blood chemistry: Secondary | ICD-10-CM | POA: Diagnosis present

## 2016-09-29 LAB — TROPONIN I
TROPONIN I: 0.61 ng/mL — AB (ref <0.03)
Troponin I: 0.62 ng/mL (ref <0.03)
Troponin I: 0.52 ng/mL (ref <0.03)

## 2016-09-29 LAB — CBC WITH DIFFERENTIAL/PLATELET
Basophils Absolute: 0 10*3/uL (ref 0.0–0.1)
Basophils Relative: 0 %
EOS ABS: 1.7 10*3/uL — AB (ref 0.0–0.7)
Eosinophils Relative: 16 %
HCT: 30.4 % — ABNORMAL LOW (ref 36.0–46.0)
Hemoglobin: 9.8 g/dL — ABNORMAL LOW (ref 12.0–15.0)
LYMPHS ABS: 3.1 10*3/uL (ref 0.7–4.0)
Lymphocytes Relative: 29 %
MCH: 26.4 pg (ref 26.0–34.0)
MCHC: 32.2 g/dL (ref 30.0–36.0)
MCV: 81.9 fL (ref 78.0–100.0)
MONO ABS: 0.9 10*3/uL (ref 0.1–1.0)
Monocytes Relative: 8 %
NEUTROS PCT: 47 %
Neutro Abs: 5 10*3/uL (ref 1.7–7.7)
PLATELETS: 221 10*3/uL (ref 150–400)
RBC: 3.71 MIL/uL — AB (ref 3.87–5.11)
RDW: 21.8 % — AB (ref 11.5–15.5)
WBC: 10.7 10*3/uL — AB (ref 4.0–10.5)

## 2016-09-29 LAB — HEPATIC FUNCTION PANEL
ALT: 74 U/L — AB (ref 14–54)
AST: 60 U/L — AB (ref 15–41)
Albumin: 2.7 g/dL — ABNORMAL LOW (ref 3.5–5.0)
Alkaline Phosphatase: 352 U/L — ABNORMAL HIGH (ref 38–126)
BILIRUBIN DIRECT: 0.3 mg/dL (ref 0.1–0.5)
BILIRUBIN INDIRECT: 0.2 mg/dL — AB (ref 0.3–0.9)
BILIRUBIN TOTAL: 0.5 mg/dL (ref 0.3–1.2)
Total Protein: 6.9 g/dL (ref 6.5–8.1)

## 2016-09-29 LAB — BASIC METABOLIC PANEL
Anion gap: 6 (ref 5–15)
BUN: 18 mg/dL (ref 6–20)
CALCIUM: 7.9 mg/dL — AB (ref 8.9–10.3)
CO2: 24 mmol/L (ref 22–32)
CREATININE: 1.39 mg/dL — AB (ref 0.44–1.00)
Chloride: 115 mmol/L — ABNORMAL HIGH (ref 101–111)
GFR calc non Af Amer: 37 mL/min — ABNORMAL LOW (ref >60)
GFR, EST AFRICAN AMERICAN: 43 mL/min — AB (ref >60)
Glucose, Bld: 93 mg/dL (ref 65–99)
Potassium: 3.1 mmol/L — ABNORMAL LOW (ref 3.5–5.1)
SODIUM: 145 mmol/L (ref 135–145)

## 2016-09-29 LAB — GAMMA GT: GGT: 179 U/L — ABNORMAL HIGH (ref 7–50)

## 2016-09-29 LAB — HEPARIN LEVEL (UNFRACTIONATED): HEPARIN UNFRACTIONATED: 0.5 [IU]/mL (ref 0.30–0.70)

## 2016-09-29 LAB — STREP PNEUMONIAE URINARY ANTIGEN: STREP PNEUMO URINARY ANTIGEN: NEGATIVE

## 2016-09-29 MED ORDER — HEPARIN BOLUS VIA INFUSION
4000.0000 [IU] | Freq: Once | INTRAVENOUS | Status: AC
  Administered 2016-09-29: 4000 [IU] via INTRAVENOUS
  Filled 2016-09-29: qty 4000

## 2016-09-29 MED ORDER — HEPARIN (PORCINE) IN NACL 100-0.45 UNIT/ML-% IJ SOLN
1000.0000 [IU]/h | INTRAMUSCULAR | Status: DC
  Administered 2016-09-29 – 2016-10-01 (×4): 1000 [IU]/h via INTRAVENOUS
  Filled 2016-09-29 (×4): qty 250

## 2016-09-29 MED ORDER — DEXTROSE 5 % IV SOLN
2.0000 g | INTRAVENOUS | Status: DC
  Administered 2016-09-29: 2 g via INTRAVENOUS
  Filled 2016-09-29 (×2): qty 2

## 2016-09-29 MED ORDER — IOPAMIDOL (ISOVUE-370) INJECTION 76%
INTRAVENOUS | Status: AC
  Administered 2016-09-29: 80 mL
  Filled 2016-09-29: qty 100

## 2016-09-29 MED ORDER — VANCOMYCIN HCL 10 G IV SOLR
1250.0000 mg | INTRAVENOUS | Status: DC
  Administered 2016-09-29: 1250 mg via INTRAVENOUS
  Filled 2016-09-29: qty 1250

## 2016-09-29 MED ORDER — POTASSIUM CHLORIDE CRYS ER 20 MEQ PO TBCR
40.0000 meq | EXTENDED_RELEASE_TABLET | Freq: Once | ORAL | Status: AC
  Administered 2016-09-29: 40 meq via ORAL
  Filled 2016-09-29: qty 2

## 2016-09-29 NOTE — Progress Notes (Signed)
PT Cancellation Note  Patient Details Name: Tara [name] MRN: [id number] DOB: Dec 17, 1945   Cancelled Treatment:    Reason Eval/Treat Not Completed: Medical issues which prohibited therapy (Pt with episode of DOE and new dx DVT.  Heparin initiated. ) Will return  In am to evaluate pt. Thanks.   Godfrey Pick Seabron Iannello 09/29/2016, 11:31 AM [facility] [phone] (pager)

## 2016-09-29 NOTE — Progress Notes (Signed)
CRITICAL VALUE ALERT  Critical value received:  Troponin 0.61  Date of notification:  09/29/16  Time of notification:  0120  Critical value read back:Yes.    Nurse who received alert:  Precious Bard, RN  MD notified (1st page):  Schorr, K  Time of first page:  0124  MD notified (2nd page):  Time of second page:  Responding MD:  Thomasenia Bottoms  Time MD responded:  [phone]

## 2016-09-29 NOTE — Progress Notes (Signed)
Pharmacy Antibiotic Note  Tara [name] is a 71 y.o. female admitted on 09/28/2016 with HCAP.  Pharmacy has been consulted for vancomycin dosing. Also has cefepime ordered per MD. Afebrile, WBC 10.7. SCr 1.39, Normalized CrCl~43. Wt 87 kg.   Plan: Adjust Vancomycin to 1250 mg IV q24 for goal VT 15 - 20 mcg/ml Change to Cefepime 2 gm IV q24 Monitor clinical progress, c/s, renal function, abx plan/LOT Vancomycin trough as indicated Weight: 192 lb 1.6 oz (87.1 kg)  Temp (24hrs), Avg:97.9 F (36.6 C), Min:97.7 F (36.5 C), Max:98.2 F (36.8 C)   Recent Labs Lab 09/28/16 1605 09/28/16 1624 09/29/16 0546  WBC 9.0  --  10.7*  CREATININE 1.47* 1.30* 1.39*    Estimated Creatinine Clearance: 34.5 mL/min (by C-G formula based on SCr of 1.39 mg/dL (H)).    Allergies  Allergen Reactions  . Aspirin Other (See Comments)    Reports GI bleed with daily use  . Lisinopril Cough  . Sudafed [Pseudoephedrine Hcl]     Pt reports she had drainage in throat that made her throat hurt.     Antimicrobials this admission:  Vanc 1/8>> Cefepime 1/8>> Dose adjustments this admission:    Microbiology results:  1/7 BCx2>> ip 1/8 S pneumo Ug neg  Eudelia Bunch, Pharm.D. [id number] 09/29/2016 10:37 AM

## 2016-09-29 NOTE — Progress Notes (Signed)
ANTICOAGULATION CONSULT NOTE - Initial Consult  Pharmacy Consult for heparin Indication: DVT  Allergies  Allergen Reactions  . Aspirin Other (See Comments)    Reports GI bleed with daily use  . Lisinopril Cough  . Sudafed [Pseudoephedrine Hcl]     Pt reports she had drainage in throat that made her throat hurt.     Patient Measurements: Weight: 192 lb 1.6 oz (87.1 kg) Heparin Dosing Weight: 62 kg  Vital Signs: Temp: 98.2 F (36.8 C) (01/08 0605) Temp Source: Oral (01/08 0605) BP: 125/76 (01/08 0605) Pulse Rate: 63 (01/08 0605)  Labs:  Recent Labs  09/28/16 1605 09/28/16 1624 09/29/16 0031 09/29/16 0546  HGB 10.6* 12.6  --  9.8*  HCT 33.0* 37.0  --  30.4*  PLT 234  --   --  221  APTT 34  --   --   --   LABPROT 17.6*  --   --   --   INR 1.43  --   --   --   CREATININE 1.47* 1.30*  --  1.39*  TROPONINI  --   --  0.61* 0.62*    Estimated Creatinine Clearance: 34.5 mL/min (by C-G formula based on SCr of 1.39 mg/dL (H)).   Medical History: Past Medical History:  Diagnosis Date  . Anxiety   . Breast cancer [facility]) 2008   right - s/p lumpectomy->chemo, radiation  . Depression   . Dysrhythmia    hx SVT  . GERD (gastroesophageal reflux disease)   . H/O: hysterectomy   . Hiatal hernia   . History of cancer chemotherapy   . History of radiation therapy   . Hyperlipidemia   . Nonischemic cardiomyopathy ([place])   . SVT (supraventricular tachycardia) ([place])    short RP SVT documented 5/14  . 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[place])     Medications:  Prescriptions Prior to Admission  Medication Sig Dispense Refill Last Dose  . azelastine (ASTELIN) 0.1 % nasal spray Place 2 sprays into both nostrils 2 (two) times daily. Use in each nostril as directed (Patient taking differently: Place 2 sprays into both nostrils 2 (two) times daily as needed for allergies. Use in each nostril as directed) 30 mL 12  at prn  . carvedilol (COREG) 6.25 MG tablet Take 1 tablet (6.25 mg total) by mouth 2 (two)  times daily with a meal. 60 tablet 3 09/28/2016 at 1200  . clonazePAM (KLONOPIN) 0.5 MG tablet TAKE (1) TABLET BY MOUTH AT BEDTIME. 90 tablet 0 09/27/2016 at Unknown time  . docusate sodium (COLACE) 100 MG capsule Take 100 mg by mouth 2 (two) times daily.    at prn  . fexofenadine (ALLEGRA) 180 MG tablet Take 180 mg by mouth daily as needed for allergies.    09/28/2016 at Unknown time  . lovastatin (MEVACOR) 40 MG tablet Take 1 tablet (40 mg total) by mouth at bedtime. 90 tablet 1 09/27/2016 at Unknown time  . pantoprazole (PROTONIX) 40 MG tablet Take 1 tablet (40 mg total) by mouth 2 (two) times daily. 180 tablet 0 09/28/2016 at Unknown time  . sertraline (ZOLOFT) 50 MG tablet Take 1 tablet (50 mg total) by mouth daily. 90 tablet 1 09/28/2016 at Unknown time  . zolpidem (AMBIEN) 10 MG tablet Take 1 tablet (10 mg total) by mouth at bedtime as needed for sleep. 90 tablet 1 09/27/2016 at Unknown time  . fluticasone (FLONASE) 50 MCG/ACT nasal spray Place 2 sprays into both nostrils daily. 16 g 2  at prn  Assessment: 71 yo F with new L DVT.  Hg 12.6>9.8, PLTC WNL.   TBW 87 kg, HDW 62 kg. Creat 1.39.    Goal of Therapy:  Heparin level 0.3-0.7 units/ml Monitor platelets by anticoagulation protocol: Yes   Plan:  Give 4000 units bolus x 1 Start heparin infusion at 1000 units/hr Check anti-Xa level in 8 hours and daily while on heparin Continue to monitor H&H and platelets   Eudelia Bunch, Pharm.D. [id number] 09/29/2016 10:17 AM   Leodis Sias T 09/29/2016,10:07 AM

## 2016-09-29 NOTE — Progress Notes (Signed)
EKG complete. Filed in patient chart.

## 2016-09-29 NOTE — Progress Notes (Signed)
*  PRELIMINARY RESULTS* Vascular Ultrasound Left lower extremity venous duplex has been completed.  Preliminary findings: the left lower extremity is positive for acute deep vein thrombosis in the left popliteal vein extending into the calf.  Preliminary results given to RN at 9:30am.  Everrett Coombe 09/29/2016, 9:29 AM

## 2016-09-29 NOTE — Progress Notes (Signed)
ANTICOAGULATION CONSULT NOTE - Follow up  Pharmacy Consult for heparin Indication: DVT  Allergies  Allergen Reactions  . Aspirin Other (See Comments)    Reports GI bleed with daily use  . Lisinopril Cough  . Sudafed [Pseudoephedrine Hcl]     Pt reports she had drainage in throat that made her throat hurt.     Patient Measurements: Weight: 192 lb 1.6 oz (87.1 kg) Heparin Dosing Weight: 62 kg  Vital Signs: Temp: 98.1 F (36.7 C) (01/08 1339) Temp Source: Oral (01/08 1339) BP: 134/72 (01/08 1339) Pulse Rate: 77 (01/08 1339)  Labs:  Recent Labs  09/28/16 1605 09/28/16 1624 09/29/16 0031 09/29/16 0546 09/29/16 1157 09/29/16 1937  HGB 10.6* 12.6  --  9.8*  --   --   HCT 33.0* 37.0  --  30.4*  --   --   PLT 234  --   --  221  --   --   APTT 34  --   --   --   --   --   LABPROT 17.6*  --   --   --   --   --   INR 1.43  --   --   --   --   --   HEPARINUNFRC  --   --   --   --   --  0.50  CREATININE 1.47* 1.30*  --  1.39*  --   --   TROPONINI  --   --  0.61* 0.62* 0.52*  --     Estimated Creatinine Clearance: 34.5 mL/min (by C-G formula based on SCr of 1.39 mg/dL (H)).   Medical History: Past Medical History:  Diagnosis Date  . Anxiety   . Breast cancer [facility]) 2008   right - s/p lumpectomy->chemo, radiation  . Depression   . Dysrhythmia    hx SVT  . GERD (gastroesophageal reflux disease)   . H/O: hysterectomy   . Hiatal hernia   . History of cancer chemotherapy   . History of radiation therapy   . Hyperlipidemia   . Nonischemic cardiomyopathy ([place])   . SVT (supraventricular tachycardia) ([place])    short RP SVT documented 5/14  . 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[place])     Medications:  Prescriptions Prior to Admission  Medication Sig Dispense Refill Last Dose  . azelastine (ASTELIN) 0.1 % nasal spray Place 2 sprays into both nostrils 2 (two) times daily. Use in each nostril as directed (Patient taking differently: Place 2 sprays into both nostrils 2 (two) times daily as needed  for allergies. Use in each nostril as directed) 30 mL 12  at prn  . carvedilol (COREG) 6.25 MG tablet Take 1 tablet (6.25 mg total) by mouth 2 (two) times daily with a meal. 60 tablet 3 09/28/2016 at 1200  . clonazePAM (KLONOPIN) 0.5 MG tablet TAKE (1) TABLET BY MOUTH AT BEDTIME. 90 tablet 0 09/27/2016 at Unknown time  . docusate sodium (COLACE) 100 MG capsule Take 100 mg by mouth 2 (two) times daily.    at prn  . fexofenadine (ALLEGRA) 180 MG tablet Take 180 mg by mouth daily as needed for allergies.    09/28/2016 at Unknown time  . lovastatin (MEVACOR) 40 MG tablet Take 1 tablet (40 mg total) by mouth at bedtime. 90 tablet 1 09/27/2016 at Unknown time  . pantoprazole (PROTONIX) 40 MG tablet Take 1 tablet (40 mg total) by mouth 2 (two) times daily. 180 tablet 0 09/28/2016 at Unknown time  . sertraline (ZOLOFT) 50  MG tablet Take 1 tablet (50 mg total) by mouth daily. 90 tablet 1 09/28/2016 at Unknown time  . zolpidem (AMBIEN) 10 MG tablet Take 1 tablet (10 mg total) by mouth at bedtime as needed for sleep. 90 tablet 1 09/27/2016 at Unknown time  . fluticasone (FLONASE) 50 MCG/ACT nasal spray Place 2 sprays into both nostrils daily. 16 g 2  at prn    Assessment: 71 yo F with new LLE DVT, started on IV heparin infusion today.  Intial 8 hour heparin level is 0.5, therapeutic on IV heparin drip 1000 units/hr. Troponins 0.62>0.52 CT chest PE today: No CT findings for pulmonary embolism  Hg 12.6>9.8, PLTC WNL.   TBW 87 kg, HDW 62 kg. Creat 1.39.    Goal of Therapy:  Heparin level 0.3-0.7 units/ml Monitor platelets by anticoagulation protocol: Yes   Plan:  Continue IV heparin drip 1000 units/hr Check next heparin level with AM labs. Daily HL and CBC    Thank you for allowing pharmacy to be part of this patients care team. Nicole Cella, RPh Clinical Pharmacist Pager: [phone] 09/29/2016 8:21 PM

## 2016-09-29 NOTE — Progress Notes (Signed)
PROGRESS NOTE    Tara [name]  [id number] DOB: 25-Jan-1946 DOA: 09/28/2016 PCP: [name], MD    Brief Narrative:  71 year old with medical history significant for remote breast cancer, nonischemic cardiomyopathy, GERD who presented complaining of generalized weakness also complaining of shortness of breath.  Further evaluation revealed DVT of left lower extremity which patient was started on heparin drip. Plans were made to reassess patient with CT angiogram to assess for PE   Assessment & Plan:   Principal Problem:   HCAP (healthcare-associated pneumonia) - Patient currently on cefepime and vancomycin. Will obtain CT angiogram of chest rule out PE. Currently covering with heparin given new diagnosis of left lower extremity DVT  Left lower extremity DVT -Pharmacy consult placed for heparin administration.  Active Problems:   Nonischemic cardiomyopathy (HCC) - No chest pain reported. Continue statin    Prediabetes - Blood sugars elevated. We'll place on diabetic diet    Acute respiratory alkalosis - Assessing for PE    Hypokalemia - Improving after oral replacement - Reassess BMP    Prolonged Q-T interval on ECG - Obtain EKG next a.m. with improvement in potassium levels.    Chronic kidney disease (CKD), stage III (moderate)    Elevated alkaline phosphatase level - Reassess next a.m.   Elevated LFTs   Elevated troponin - Trending down currently. Will assess for PE as mentioned above. In context of patient with shortness of breath with elevated serum creatinine 1.3. Suspect most likely demand ischemia. Patient not complaining of any chest pain.    Anemia - No active bleeding. Stable we'll reassess next a.m.  DVT prophylaxis: Heparin gtt Code Status: Full Family Communication: Discussed with spouse Disposition Plan: Pending workup and improvement in condition   Consultants:   None   Procedures: CT angiogram of chest pending   Antimicrobials:  Cefepime and vancomycin   Subjective: The patient has no new complaints. Reports mild improvement in breathing condition.  Objective: Vitals:   09/28/16 1945 09/28/16 2101 09/29/16 0605 09/29/16 1339  BP: 144/84 (!) 157/85 125/76 134/72  Pulse: 60 68 63 77  Resp: 17 18 20 20   Temp:  97.7 F (36.5 C) 98.2 F (36.8 C) 98.1 F (36.7 C)  TempSrc:  Oral Oral Oral  SpO2: 92% 92% 96% 94%  Weight:  87.1 kg (192 lb 1.6 oz)      Intake/Output Summary (Last 24 hours) at 09/29/16 1719 Last data filed at 09/29/16 1656  Gross per 24 hour  Intake          1266.25 ml  Output              601 ml  Net           665.25 ml   Filed Weights   09/28/16 2101  Weight: 87.1 kg (192 lb 1.6 oz)    Examination:  General exam: Appears calm and comfortable , In no acute distress Respiratory system:Positive rales, equal chest rise, no wheezes Cardiovascular system: S1 & S2 heard, RRR. No JVD, murmurs,  Gastrointestinal system: Abdomen is nondistended, soft and nontender.  Central nervous system: Alert and oriented. No focal neurological deficits. Extremities: Symmetric 5 x 5 power. No facial asymmetry Skin: No rashes, lesions or ulcers, on limited exam Psychiatry: Judgement and insight appear normal. Mood & affect appropriate.   Data Reviewed: I have personally reviewed following labs and imaging studies  CBC:  Recent Labs Lab 09/28/16 1605 09/28/16 1624 09/29/16 0546  WBC 9.0  --  10.7*  NEUTROABS 4.7  --  5.0  HGB 10.6* 12.6 9.8*  HCT 33.0* 37.0 30.4*  MCV 81.7  --  81.9  PLT 234  --  [id number]   Basic Metabolic Panel:  Recent Labs Lab 09/28/16 1605 09/28/16 1624 09/29/16 0546  NA 142 147* 145  K 2.6* 2.6* 3.1*  CL 109 106 115*  CO2 23  --  24  GLUCOSE 135* 138* 93  BUN 20 21* 18  CREATININE 1.47* 1.30* 1.39*  CALCIUM 8.3*  --  7.9*  MG 2.1  --   --    GFR: Estimated Creatinine Clearance: 34.5 mL/min (by C-G formula based on SCr of 1.39 mg/dL (H)). Liver Function  Tests:  Recent Labs Lab 09/28/16 1605 09/29/16 0546  AST 75* 60*  ALT 90* 74*  ALKPHOS 425* 352*  BILITOT 1.1 0.5  PROT 7.3 6.9  ALBUMIN 3.1* 2.7*   No results for input(s): LIPASE, AMYLASE in the last 168 hours. No results for input(s): AMMONIA in the last 168 hours. Coagulation Profile:  Recent Labs Lab 09/28/16 1605  INR 1.43   Cardiac Enzymes:  Recent Labs Lab 09/29/16 0031 09/29/16 0546 09/29/16 1157  TROPONINI 0.61* 0.62* 0.52*   BNP (last 3 results) No results for input(s): PROBNP in the last 8760 hours. HbA1C: No results for input(s): HGBA1C in the last 72 hours. CBG: No results for input(s): GLUCAP in the last 168 hours. Lipid Profile: No results for input(s): CHOL, HDL, LDLCALC, TRIG, CHOLHDL, LDLDIRECT in the last 72 hours. Thyroid Function Tests: No results for input(s): TSH, T4TOTAL, FREET4, T3FREE, THYROIDAB in the last 72 hours. Anemia Panel: No results for input(s): VITAMINB12, FOLATE, FERRITIN, TIBC, IRON, RETICCTPCT in the last 72 hours. Sepsis Labs: No results for input(s): PROCALCITON, LATICACIDVEN in the last 168 hours.  Recent Results (from the past 240 hour(s))  Culture, blood (routine x 2) Call MD if unable to obtain prior to antibiotics being given     Status: None (Preliminary result)   Collection Time: 09/28/16 10:00 PM  Result Value Ref Range Status   Specimen Description BLOOD LEFT ANTECUBITAL  Final   Special Requests BOTTLES DRAWN AEROBIC AND ANAEROBIC 10CC EA  Final   Culture NO GROWTH < 24 HOURS  Final   Report Status PENDING  Incomplete  Culture, blood (routine x 2) Call MD if unable to obtain prior to antibiotics being given     Status: None (Preliminary result)   Collection Time: 09/28/16 10:07 PM  Result Value Ref Range Status   Specimen Description BLOOD LEFT HAND  Final   Special Requests IN PEDIATRIC BOTTLE 3CC  Final   Culture NO GROWTH < 24 HOURS  Final   Report Status PENDING  Incomplete         Radiology  Studies: Dg Chest 2 View  Result Date: 09/28/2016 CLINICAL DATA:  71 year old with acute onset of shortness of breath and generalized weakness that began yesterday, now associated with slurred speech. Personal history of right breast cancer post lumpectomy in 2008. EXAM: CHEST  2 VIEW COMPARISON:  08/25/2016, 06/22/2016 and earlier. FINDINGS: AP semi-erect and lateral images were obtained. Cardiac silhouette mildly to moderately enlarged, unchanged when allowing for differences in technique. New focal opacity in the superior segment right lower lobe since the examination 1 month ago. Lungs otherwise clear. No pleural effusions. Normal pulmonary vascularity. Calcified right paratracheal and right hilar lymph nodes again noted. Visualized bony thorax intact. IMPRESSION: 1. Acute pneumonia suspected involving the superior segment of the right  lower lobe. 2. Stable cardiomegaly without pulmonary edema. 3. Calcified right paratracheal and right hilar lymph nodes indicating old granulomatous disease. Electronically Signed   By: Evangeline Dakin M.D.   On: 09/28/2016 18:17   Ct Head Wo Contrast  Result Date: 09/28/2016 CLINICAL DATA:  Slurred speech. History of meningioma of the planum sphenoidale region. EXAM: CT HEAD WITHOUT CONTRAST TECHNIQUE: Contiguous axial images were obtained from the base of the skull through the vertex without intravenous contrast. COMPARISON:  Brain MRI March 05, 2016 FINDINGS: Brain: The ventricles are normal in size and configuration. The previously noted meningioma in the planum sphenoidale region is again noted, less than optimally seen on noncontrast enhanced study. It currently measures 2.9 x 2.3 x 1.8 cm, slightly larger than on prior study. There is no surrounding edema, and this meningioma does not cause significant mass effect. There is no other mass evident. There is no hemorrhage, extra-axial fluid collection, or midline shift. Mild basal ganglia calcification bilaterally is felt  to be physiologic in this age group. No evident acute infarct. Vascular: There is no vascular calcification evident. No hyperdense vessel. Skull: The bony calvarium appears intact. Sinuses/Orbits: Visualized paranasal sinuses are clear except for modest mucosal thickening in the right sphenoid sinus region. Frontal sinuses are hypoplastic. Visualized orbits appear symmetric bilaterally. Other: Mastoid air cells are clear. There is debris in both external auditory canals. IMPRESSION: Planum sphenoidale region meningioma again noted, modestly increased in size from approximately 10 years prior. No surrounding edema. No new mass evident. No hemorrhage, extra-axial fluid collection, or midline shift. No evidence suggesting acute infarct. Debris in each external auditory canal, likely cerumen. Minimal right sphenoid sinus disease. Electronically Signed   By: Lowella Grip III M.D.   On: 09/28/2016 17:34   Scheduled Meds: . iopamidol      . ceFEPime (MAXIPIME) IV  2 g Intravenous Q24H  . clonazePAM  1 mg Oral QHS  . docusate sodium  100 mg Oral BID  . fluticasone  2 spray Each Nare Daily  . pantoprazole  40 mg Oral BID  . pravastatin  40 mg Oral q1800  . vancomycin  1,250 mg Intravenous Q24H   Continuous Infusions: . heparin 1,000 Units/hr (09/29/16 1117)  . lactated ringers 75 mL/hr at 09/29/16 1557     LOS: 1 day   Time spent: > 35 minutes  Velvet Bathe, MD [facility] Pager [phone]  If 7PM-7AM, please contact night-coverage [email or web address] Password [email or web address] 09/29/2016, 5:19 PM

## 2016-09-29 NOTE — Progress Notes (Signed)
OT Cancellation Note  Patient Details Name: Tara [name] MRN: [id number] DOB: [birth year]   Cancelled Treatment:    Reason Eval/Treat Not Completed: Medical issues which prohibited therapy. New DVT found and pt started on heparin. OT will attempt evaluation on 09/30/16.    Gypsy Decant, MS, OTR/L, CBIS 09/29/2016, 12:18 PM

## 2016-09-30 DIAGNOSIS — R7989 Other specified abnormal findings of blood chemistry: Secondary | ICD-10-CM

## 2016-09-30 DIAGNOSIS — I82401 Acute embolism and thrombosis of unspecified deep veins of right lower extremity: Secondary | ICD-10-CM

## 2016-09-30 DIAGNOSIS — I82409 Acute embolism and thrombosis of unspecified deep veins of unspecified lower extremity: Secondary | ICD-10-CM

## 2016-09-30 DIAGNOSIS — I248 Other forms of acute ischemic heart disease: Secondary | ICD-10-CM

## 2016-09-30 DIAGNOSIS — I5043 Acute on chronic combined systolic (congestive) and diastolic (congestive) heart failure: Secondary | ICD-10-CM

## 2016-09-30 DIAGNOSIS — J189 Pneumonia, unspecified organism: Principal | ICD-10-CM

## 2016-09-30 LAB — COMPREHENSIVE METABOLIC PANEL
ALT: 84 U/L — AB (ref 14–54)
AST: 78 U/L — AB (ref 15–41)
Albumin: 2.7 g/dL — ABNORMAL LOW (ref 3.5–5.0)
Alkaline Phosphatase: 493 U/L — ABNORMAL HIGH (ref 38–126)
Anion gap: 9 (ref 5–15)
BUN: 13 mg/dL (ref 6–20)
CHLORIDE: 111 mmol/L (ref 101–111)
CO2: 21 mmol/L — AB (ref 22–32)
Calcium: 8.3 mg/dL — ABNORMAL LOW (ref 8.9–10.3)
Creatinine, Ser: 1.18 mg/dL — ABNORMAL HIGH (ref 0.44–1.00)
GFR calc Af Amer: 53 mL/min — ABNORMAL LOW (ref >60)
GFR, EST NON AFRICAN AMERICAN: 46 mL/min — AB (ref >60)
Glucose, Bld: 87 mg/dL (ref 65–99)
POTASSIUM: 3 mmol/L — AB (ref 3.5–5.1)
SODIUM: 141 mmol/L (ref 135–145)
Total Bilirubin: 1.1 mg/dL (ref 0.3–1.2)
Total Protein: 7 g/dL (ref 6.5–8.1)

## 2016-09-30 LAB — CBC
HEMATOCRIT: 33.5 % — AB (ref 36.0–46.0)
HEMOGLOBIN: 10.5 g/dL — AB (ref 12.0–15.0)
MCH: 25.9 pg — ABNORMAL LOW (ref 26.0–34.0)
MCHC: 31.3 g/dL (ref 30.0–36.0)
MCV: 82.5 fL (ref 78.0–100.0)
Platelets: 213 10*3/uL (ref 150–400)
RBC: 4.06 MIL/uL (ref 3.87–5.11)
RDW: 22.1 % — AB (ref 11.5–15.5)
WBC: 11 10*3/uL — AB (ref 4.0–10.5)

## 2016-09-30 LAB — HEPARIN LEVEL (UNFRACTIONATED): HEPARIN UNFRACTIONATED: 0.59 [IU]/mL (ref 0.30–0.70)

## 2016-09-30 MED ORDER — METOCLOPRAMIDE HCL 5 MG PO TABS
5.0000 mg | ORAL_TABLET | Freq: Two times a day (BID) | ORAL | Status: DC
  Administered 2016-09-30 – 2016-10-03 (×7): 5 mg via ORAL
  Filled 2016-09-30 (×7): qty 1

## 2016-09-30 MED ORDER — CEFEPIME HCL 1 G IJ SOLR
1.0000 g | Freq: Two times a day (BID) | INTRAMUSCULAR | Status: DC
  Administered 2016-09-30 – 2016-10-02 (×3): 1 g via INTRAVENOUS
  Filled 2016-09-30 (×4): qty 1

## 2016-09-30 MED ORDER — METOPROLOL TARTRATE 25 MG PO TABS
25.0000 mg | ORAL_TABLET | Freq: Two times a day (BID) | ORAL | Status: DC
  Administered 2016-10-01 – 2016-10-03 (×5): 25 mg via ORAL
  Filled 2016-09-30 (×5): qty 1

## 2016-09-30 MED ORDER — FUROSEMIDE 10 MG/ML IJ SOLN
40.0000 mg | Freq: Every day | INTRAMUSCULAR | Status: DC
  Administered 2016-09-30: 40 mg via INTRAVENOUS
  Filled 2016-09-30: qty 4

## 2016-09-30 MED ORDER — SACUBITRIL-VALSARTAN 24-26 MG PO TABS
1.0000 | ORAL_TABLET | Freq: Two times a day (BID) | ORAL | Status: DC
  Administered 2016-10-01 – 2016-10-03 (×5): 1 via ORAL
  Filled 2016-09-30 (×6): qty 1

## 2016-09-30 MED ORDER — VANCOMYCIN HCL IN DEXTROSE 750-5 MG/150ML-% IV SOLN
750.0000 mg | Freq: Two times a day (BID) | INTRAVENOUS | Status: DC
  Administered 2016-09-30 – 2016-10-01 (×2): 750 mg via INTRAVENOUS
  Filled 2016-09-30 (×2): qty 150

## 2016-09-30 MED ORDER — FUROSEMIDE 10 MG/ML IJ SOLN
40.0000 mg | Freq: Two times a day (BID) | INTRAMUSCULAR | Status: DC
  Administered 2016-09-30 – 2016-10-01 (×2): 40 mg via INTRAVENOUS
  Filled 2016-09-30 (×2): qty 4

## 2016-09-30 MED ORDER — POTASSIUM CHLORIDE CRYS ER 20 MEQ PO TBCR
40.0000 meq | EXTENDED_RELEASE_TABLET | Freq: Once | ORAL | Status: AC
  Administered 2016-09-30: 40 meq via ORAL
  Filled 2016-09-30: qty 2

## 2016-09-30 MED ORDER — CARVEDILOL 6.25 MG PO TABS
6.2500 mg | ORAL_TABLET | Freq: Two times a day (BID) | ORAL | Status: AC
  Administered 2016-09-30: 6.25 mg via ORAL
  Filled 2016-09-30: qty 1

## 2016-09-30 NOTE — Progress Notes (Signed)
Pharmacy Antibiotic Note  Tara [name] is a 71 y.o. female admitted on 09/28/2016 with HCAP.  Pharmacy has been consulted for vancomycin dosing. Also has cefepime ordered per MD. Afebrile, WBC 11. SCr 1.39>>1.18, Normalized CrCl~50.  Wt 87 kg.  1/8 CT: neg for PE, bibasilar infiltrates and R pleural effusion, diffuse thickened esophageal wall could be esopagitis or collage vasc dz.   Plan: Adjust Vancomycin to 750 mg IV q12 for goal VT 15 - 20 mcg/ml Change to Cefepime 1 gm IV q12 Monitor clinical progress, c/s, renal function, abx plan/LOT Vancomycin trough as indicated  Weight: 192 lb 1.6 oz (87.1 kg)  Temp (24hrs), Avg:97.9 F (36.6 C), Min:97.6 F (36.4 C), Max:98.2 F (36.8 C)   Recent Labs Lab 09/28/16 1605 09/28/16 1624 09/29/16 0546 09/30/16 0537  WBC 9.0  --  10.7* 11.0*  CREATININE 1.47* 1.30* 1.39* 1.18*    Estimated Creatinine Clearance: 40.6 mL/min (by C-G formula based on SCr of 1.18 mg/dL (H)).    Allergies  Allergen Reactions  . Aspirin Other (See Comments)    Reports GI bleed with daily use  . Lisinopril Cough  . Sudafed [Pseudoephedrine Hcl]     Pt reports she had drainage in throat that made her throat hurt.     Antimicrobials this admission:  Vanc 1/8>> Cefepime 1/8>> Dose adjustments this admission:  Doses adjusted 1/8 and 1/9 for change in creatinine  Microbiology results:  1/7 BCx2>> ngtd 1/8 S pneumo Ug neg  Eudelia Bunch, Pharm.D. BP:[id number] 09/30/2016 1:51 PM

## 2016-09-30 NOTE — Consult Note (Signed)
Cardiology Consult    Patient ID: Tara [name] MRN: [id number], DOB/AGE: 23-Jul-1946   Admit date: 09/28/2016 Date of Consult: 09/30/2016  Primary Physician: [name], MD Reason for Consult: CHF Primary Cardiologist: Dr Rayann Heman Requesting Provider: Dr Wendee Beavers   History of Present Illness    Tara [name] is a 71 female with past medical history of SVT, NICM, HLD and breast cancer S/P chemo/radiation/surgery (right mastectomy) who presented to [facility] ED with complaints of generalized weakness and shortness of breath. She is currently being treated for HCAP with IV antibiotics. A lower extremity venous duplex study found acute DVT of the left lower extremity. The patient has been started on heparin drip and a CT angiogram of the chest showed no CT findings for pulmonary embolism. We have been asked to consult on this patient for evaluation of CHF and elevated troponin.  Troponins have been elevated at 0.61, 0.62, 0.52. Potassium is low at 2.6 on admission 09/28/16, 3.0 today 09/30/16. Creatinine elevated to 1.47 on admission 09/28/16, trending down to 1.18 today 1/9. Baseline creatinine 0.77-1.14.  BNP 780.9  Chest xray showed acute pneumonia and stable cardiomegaly without pulmonary edema.  The patient was noted to have a reduced EF of 35-40% by echo in 02/2016. A stress myoview was done in 04/2016 to determine if there was an ischemic cause of decreased systolic function and there was no ischemia. History of SVT is controlled with beta blocker.   She was last seen in the cardiology office on 07/02/2016 by Truitt Merle, NP. At that time she had improvement of DOE with addition of low dose ARB and switch from verapamil to coreg in response to decreased EF. The ARB was dc'd to make room for increased carvedilol for SVT.  A follow up echo was ordered for 06/2016 but was not done.  05/01/16- myoview  The left ventricular ejection fraction is moderately decreased (30-44%).  Nuclear stress  EF: 34%.  There was no ST segment deviation noted during stress.  Defect 1: There is a small defect of moderate severity present in the apical lateral and apex location.  Findings consistent with prior myocardial infarction or scar.  This is a high risk study due to reduced systolic function. No ischemia was identified.     Past Medical History   Past Medical History:  Diagnosis Date  . Anxiety   . Breast cancer [facility]) 2008   right - s/p lumpectomy->chemo, radiation  . Depression   . Dysrhythmia    hx SVT  . GERD (gastroesophageal reflux disease)   . H/O: hysterectomy   . Hiatal hernia   . History of cancer chemotherapy   . History of radiation therapy   . Hyperlipidemia   . Nonischemic cardiomyopathy ([place])   . SVT (supraventricular tachycardia) ([place])    short RP SVT documented 5/14  . Systolic CHF [facility])     Past Surgical History:  Procedure Laterality Date  . ABDOMINAL HYSTERECTOMY  1994   fibroids,   . BIOPSY  06/19/2016   Procedure: BIOPSY;  Surgeon: Daneil Dolin, MD;  Location: [facility] ENDO SUITE;  Service: Endoscopy;;  gastric duodenum  . BREAST SURGERY Right 2008   lumpectomy, cancer  . CHOLECYSTECTOMY  1999  . COLONOSCOPY  2008   Dr. Oneida Alar: multiple polyps. Path not available at time of visit.   Marland Kitchen COLONOSCOPY WITH PROPOFOL N/A 04/25/2014   Dr. Oneida Alar: Multiple tubular adenomas removed. Diverticulosis. Moderate internal hemorrhoids. Next colonoscopy planned for August 2018.  Marland Kitchen  ESOPHAGOGASTRODUODENOSCOPY (EGD) WITH PROPOFOL N/A 06/19/2016   Procedure: ESOPHAGOGASTRODUODENOSCOPY (EGD) WITH PROPOFOL;  Surgeon: Daneil Dolin, MD;  Location: [facility] ENDO SUITE;  Service: Endoscopy;  Laterality: N/A;  . POLYPECTOMY N/A 04/25/2014   Procedure: POLYPECTOMY;  Surgeon: Danie Binder, MD;  Location: [facility] ORS;  Service: Endoscopy;  Laterality: N/A;  Ascending and Decending Colon x3 , Transverse colon x2, rectal     Allergies  Allergies  Allergen Reactions  . Aspirin Other (See  Comments)    Reports GI bleed with daily use  . Lisinopril Cough  . Sudafed [Pseudoephedrine Hcl]     Pt reports she had drainage in throat that made her throat hurt.     Inpatient Medications    . carvedilol  6.25 mg Oral BID WC  . ceFEPime (MAXIPIME) IV  1 g Intravenous Q12H  . clonazePAM  1 mg Oral QHS  . docusate sodium  100 mg Oral BID  . fluticasone  2 spray Each Nare Daily  . furosemide  40 mg Intravenous Daily  . metoCLOPramide  5 mg Oral BID  . pantoprazole  40 mg Oral BID  . pravastatin  40 mg Oral q1800  . vancomycin  750 mg Intravenous Q12H    Family History    Family History  Problem Relation Age of Onset  . Colon cancer Father 45    died at age 75  . Hypertension Sister   . Dementia Mother   . Cancer Brother 49    prostate    Social History    Social History   Social History  . Marital status: Married    Spouse name: N/A  . Number of children: 2  . Years of education: N/A   Occupational History  . disabled  Unemployed   Social History Main Topics  . Smoking status: Never Smoker  . Smokeless tobacco: Never Used  . Alcohol use No  . Drug use: No  . Sexual activity: Yes    Birth control/ protection: Surgical   Other Topics Concern  . Not on file   Social History Narrative   Lives in [place] with family.  Does not routinely exercise.     Review of Systems    General:  No chills, fever, night sweats or weight changes.  Cardiovascular:  No chest pain, palpitations, paroxysmal nocturnal dyspnea. Positive for shortness of breath-improved since admission  Dermatological: No rash, lesions/masses Respiratory: No cough,  Urologic: No hematuria, dysuria Abdominal:   No nausea, vomiting, diarrhea, bright red blood per rectum, melena, or hematemesis Neurologic:  No visual changes, wkns, changes in mental status. All other systems reviewed and are otherwise negative except as noted above.  Physical Exam    Blood pressure 139/87, pulse 81,  temperature 97.7 F (36.5 C), temperature source Oral, resp. rate 20, weight 192 lb 1.6 oz (87.1 kg), SpO2 95 %.  General: Pleasant AA female, NAD Psych: Normal affect. Neuro: Alert and oriented X 3. Moves all extremities spontaneously. HEENT: Normal  Neck: Supple without bruits or JVD. Lungs:  Resp regular, tachypneic and unlabored, CTA. Heart: RRR no s3, s4, or murmurs. Abdomen: Soft, non-tender, non-distended, BS + x 4.  Extremities: No clubbing or cyanosis. DP/PT/Radials 2+ and equal bilaterally. Trace edema of right lower leg, 1+ edema of left lower leg. Right arm edema.  Labs    Troponin [facility] of Care Test)  Recent Labs  09/28/16 [place] 0.43*    Recent Labs  09/29/16 0031 09/29/16 0546 09/29/16 1157  TROPONINI 0.61* 0.62* 0.52*   Lab Results  Component Value Date   WBC 11.0 (H) 09/30/2016   HGB 10.5 (L) 09/30/2016   HCT 33.5 (L) 09/30/2016   MCV 82.5 09/30/2016   PLT 213 09/30/2016    Recent Labs Lab 09/30/16 0537  NA 141  K 3.0*  CL 111  CO2 21*  BUN 13  CREATININE 1.18*  CALCIUM 8.3*  PROT 7.0  BILITOT 1.1  ALKPHOS 493*  ALT 84*  AST 78*  GLUCOSE 87   Lab Results  Component Value Date   CHOL 205 (H) 10/09/2015   HDL 59 10/09/2015   LDLCALC 119 10/09/2015   TRIG 135 10/09/2015   No results found for: [facility]   Radiology Studies    Dg Chest 2 View  Result Date: 09/28/2016 CLINICAL DATA:  71 year old with acute onset of shortness of breath and generalized weakness that began yesterday, now associated with slurred speech. Personal history of right breast cancer post lumpectomy in 2008. EXAM: CHEST  2 VIEW COMPARISON:  08/25/2016, 06/22/2016 and earlier. FINDINGS: AP semi-erect and lateral images were obtained. Cardiac silhouette mildly to moderately enlarged, unchanged when allowing for differences in technique. New focal opacity in the superior segment right lower lobe since the examination 1 month ago. Lungs otherwise clear. No pleural  effusions. Normal pulmonary vascularity. Calcified right paratracheal and right hilar lymph nodes again noted. Visualized bony thorax intact. IMPRESSION: 1. Acute pneumonia suspected involving the superior segment of the right lower lobe. 2. Stable cardiomegaly without pulmonary edema. 3. Calcified right paratracheal and right hilar lymph nodes indicating old granulomatous disease. Electronically Signed   By: Evangeline Dakin M.D.   On: 09/28/2016 18:17   Ct Head Wo Contrast  Result Date: 09/28/2016 CLINICAL DATA:  Slurred speech. History of meningioma of the planum sphenoidale region. EXAM: CT HEAD WITHOUT CONTRAST TECHNIQUE: Contiguous axial images were obtained from the base of the skull through the vertex without intravenous contrast. COMPARISON:  Brain MRI March 05, 2016 FINDINGS: Brain: The ventricles are normal in size and configuration. The previously noted meningioma in the planum sphenoidale region is again noted, less than optimally seen on noncontrast enhanced study. It currently measures 2.9 x 2.3 x 1.8 cm, slightly larger than on prior study. There is no surrounding edema, and this meningioma does not cause significant mass effect. There is no other mass evident. There is no hemorrhage, extra-axial fluid collection, or midline shift. Mild basal ganglia calcification bilaterally is felt to be physiologic in this age group. No evident acute infarct. Vascular: There is no vascular calcification evident. No hyperdense vessel. Skull: The bony calvarium appears intact. Sinuses/Orbits: Visualized paranasal sinuses are clear except for modest mucosal thickening in the right sphenoid sinus region. Frontal sinuses are hypoplastic. Visualized orbits appear symmetric bilaterally. Other: Mastoid air cells are clear. There is debris in both external auditory canals. IMPRESSION: Planum sphenoidale region meningioma again noted, modestly increased in size from approximately 10 years prior. No surrounding edema. No  new mass evident. No hemorrhage, extra-axial fluid collection, or midline shift. No evidence suggesting acute infarct. Debris in each external auditory canal, likely cerumen. Minimal right sphenoid sinus disease. Electronically Signed   By: Lowella Grip III M.D.   On: 09/28/2016 17:34   Ct Angio Chest Pe W Or Wo Contrast  Result Date: 09/29/2016 CLINICAL DATA:  Shortness of breath. EXAM: CT ANGIOGRAPHY CHEST WITH CONTRAST TECHNIQUE: Multidetector CT imaging of the chest was performed using the standard protocol during bolus administration of intravenous  contrast. Multiplanar CT image reconstructions and MIPs were obtained to evaluate the vascular anatomy. CONTRAST:  80 cc Isovue 370 COMPARISON:  None. FINDINGS: Examination is limited by obesity and by breathing motion artifact. Chest wall: No breast masses, supraclavicular or axillary lymphadenopathy. Cardiovascular: The heart is enlarged. No pericardial effusion. There is some contrast in the left atrium and faint contrast on left ventricle. I do not see any contrast in the aorta worn the pulmonary veins. Patient may have had a test bolus. Significant reflux down the IVC and into the hepatic veins. There is also reflux back into the azygos vein. The pulmonary arterial tree is fairly well opacified. No filling defects to suggest pulmonary embolism. Mediastinum/Nodes: No mediastinal or hilar mass or lymphadenopathy. Scattered lymph nodes are noted. Diffuse esophageal wall thickening could be due to esophagitis or collagen vascular disease. Lungs/Pleura: Limited examination due to breathing motion artifact. A calcified granuloma is noted in the right lower lobe. Patchy peripheral infiltrate in the right lower lobe corresponding with the recent chest x-ray. Is also peribronchial thickening and patchy airspace opacity in the left lower lobe. Date small right pleural effusion is noted. No left pleural effusion. Upper Abdomen: No significant upper abdominal  findings. Musculoskeletal: No significant bony findings. Review of the MIP images confirms the above findings. IMPRESSION: 1. No CT findings for pulmonary embolism. 2. Normal caliber thoracic aorta. 3. Significant reflux of contrast down the IVC likely due to tricuspid regurgitation. 4. Diffusely thickened esophageal wall could be due to esophagitis or collagen vascular disease. Recommend correlation with recent endoscopy. 5. Bibasilar infiltrates and right pleural effusion. Electronically Signed   By: Marijo Sanes M.D.   On: 09/29/2016 18:19    EKG & Cardiac Imaging     EKG: -09/28/16 sinus rhythm at 63 bpm with prolonged QTC of .607, left axis deviation and non-specific T wave changes. -09/29/16 [place] rhythm at 65 bpm with normalized QTC of 0.426, left axis deviation and non-specific T wave changes  Echocardiogram:   09/29/2016- pending  Previous Echo 03/21/2016 Study Conclusions - Left ventricle: The cavity size was mildly dilated. Wall   thickness was normal. Systolic function was moderately reduced.   The estimated ejection fraction was in the range of 35% to 40%.   The study is not technically sufficient to allow evaluation of LV   diastolic function. - Aortic valve: Valve area (VTI): 1.99 cm^2. Valve area (Vmax):   2.19 cm^2. Valve area (Vmean): 2.23 cm^2. - Mitral valve: There was mild regurgitation. - Left atrium: The atrium was mildly to moderately dilated. - Pulmonary arteries: PA peak pressure: 49 mm Hg (S).   Assessment & Plan    1. Non-Ischemic cardiomyopathy -The patient was noted to have a reduced EF of 35-40% by echo in 02/2016. A stress myoview was done in 04/2016 to determine if there was an ischemic cause of decreased systolic function and there was no ischemia.  -Has been fairly well controlled on carvedilol 6.25 mg bid. Coreg had been increased to control SVT and ARB was stopped. -BNP 780.9, CXR showed acute pneumonia and stable cardiomegaly without pulmonary  edema -Received one dose of lasix 40 mg IV today with good UOP, pt feels that her breathing has improved -Will increase lasix to 40 mg IV BID and follow -Will change carvedilol to metoprolol 25 mg bid for SVT control with less BP lowering, to allow for addition of Entresto for improved heart function. SBPs have been 125-171 today. (per discussion with Dr Oval Linsey)  2.  Hypokalemia -K+ 2.6 on admission 09/28/16, 3.1 (1/8), 3.0 (1/9) -magnesium 2.1, OK -No diuresis until first dose lasix on 1/9 -Receiving potassium repletion per IM -Prolonged QTC on admission improved with improved K+. ( 0 .607-->0.426)  3. HCAP -Managed with antibiotics per IM -CTA chest showed Bibasilar infiltrates and right pleural effusion.  4. Left leg DVT -anticoagulated with IV heparin, will need transition to [place]. Discussed with pt and she will discuss with her daughter who is a Marine scientist as to which medication she prefers. -CT angiogram of chest showed no CT findings for pulmonary embolism  5. Elevated troponins -Mild elevation in flat pattern, 0.61, 0.62, 0.52, likely related to demand ischemia in setting of pneumonia and ruling out PE -Pt with myoview showing no ischemia in 04/2016 -Considering her acute problems of pneumonia and DVT, and no chest pain, would continue with current therapy and defer further ischemic workup at present and continue to monitor  6. Acute renal insufficiency -Creatinine elevated to 1.47 on admission 09/28/16, trending down to 1.18 today 1/9. Baseline creatinine 0.77-1.14. -Continue to monitor  7. History of SVT -Controlled with beta blocker  8. Dyslipidemia -Lipid profile 10/09/15- LDL 119 -Continue lovastatin  SignedDaune Perch, NP-C 09/30/2016, 2:37 PM Pager: [phone]

## 2016-09-30 NOTE — Evaluation (Signed)
Physical Therapy Evaluation Patient Details Name: Tara [name] MRN: [id number] DOB: [birth year] Today's Date: 09/30/2016   History of Present Illness  71 year old with medical history significant for remote breast cancer, nonischemic cardiomyopathy, GERD who presented complaining of generalized weakness also complaining of shortness of breath.  Treated with LAsix for volume overload.  Also positive for Left LE DVT.    Clinical Impression  Pt admitted with above diagnosis. Pt currently with functional limitations due to the deficits listed below (see PT Problem List). Pt was able to sit EOB for up to 5 minutes.  Limited treatment as pt with constant urination and could not clean pt before pt was urinating again.  Will follow acutely but suspect pt will need SNF to get stronger as she has been in and out of hospital multiple times recently.  Pt will benefit from skilled PT to increase their independence and safety with mobility to allow discharge to the venue listed below.      Follow Up Recommendations SNF;Supervision/Assistance - 24 hour    Equipment Recommendations  Rolling walker with 5" wheels;3in1 (PT);Other (comment) (TBA fully at next venue)    Recommendations for Other Services       Precautions / Restrictions Precautions Precautions: Fall Restrictions Weight Bearing Restrictions: No      Mobility  Bed Mobility Overal bed mobility: Needs Assistance Bed Mobility: Supine to Sit     Supine to sit: Min assist     General bed mobility comments: Needed min assist for LEs and elevation of trunk. Once pt sitting 1 minute, stated she needed to use the potty and then noted that urine dripping as pt had already started urinating.  Called NT as all linens wet.  NT came in and PT and NT changed all linens and cleaned pt fully.  NT left.  Pt again stated she needed to use the bathroom and then before PT could get bedpan or 3N1 pt had already urinated again.  PT was able to change pads  without calling NT this time.  Got female urinal and placed between pts legs to try that and see if that helps.    Transfers                    Ambulation/Gait             General Gait Details: Unable as pt essentially constantly urinating.   Stairs            Wheelchair Mobility    Modified Rankin (Stroke Patients Only)       Balance Overall balance assessment: Needs assistance;History of Falls Sitting-balance support: Single extremity supported;Feet supported;No upper extremity supported Sitting balance-Leahy Scale: Fair Sitting balance - Comments: can sit EOB and balance herself without support                                     Pertinent Vitals/Pain Pain Assessment: No/denies pain  VSS with sats 90% and > on 3LO2.  HR stable throughout as well.     Home Living Family/patient expects to be discharged to:: Private residence Living Arrangements: Spouse/significant other Available Help at Discharge: Family;Available 24 hours/day Type of Home: House Home Access: Stairs to enter Entrance Stairs-Rails: None Entrance Stairs-Number of Steps: 1 Home Layout: Two level;Able to live on main level with bedroom/bathroom Home Equipment: None      Prior Function Level of Independence: Needs assistance  Gait / Transfers Assistance Needed: Pt did not use equipment per husband. But he did state that he has had to help pt off floor twice in last few weeks and that daughter had to come and help her at least once.  She has been falling and they have been struggling.   ADL's / Homemaking Assistance Needed: A by husband        Hand Dominance        Extremity/Trunk Assessment   Upper Extremity Assessment Upper Extremity Assessment: Defer to OT evaluation    Lower Extremity Assessment Lower Extremity Assessment: Generalized weakness    Cervical / Trunk Assessment Cervical / Trunk Assessment: Kyphotic  Communication   Communication: No  difficulties  Cognition Arousal/Alertness: Awake/alert Behavior During Therapy: WFL for tasks assessed/performed Overall Cognitive Status: Within Functional Limits for tasks assessed                      General Comments      Exercises     Assessment/Plan    PT Assessment Patient needs continued PT services  PT Problem List Decreased activity tolerance;Decreased balance;Decreased mobility;Decreased knowledge of use of DME;Decreased safety awareness;Decreased knowledge of precautions;Cardiopulmonary status limiting activity;Decreased strength          PT Treatment Interventions DME instruction;Gait training;Functional mobility training;Therapeutic activities;Therapeutic exercise;Balance training;Patient/family education;Cognitive remediation    PT Goals (Current goals can be found in the Care Plan section)  Acute Rehab PT Goals Patient Stated Goal: to get better PT Goal Formulation: With patient Time For Goal Achievement: 10/14/16 Potential to Achieve Goals: Fair    Frequency Min 3X/week   Barriers to discharge        Co-evaluation               End of Session Equipment Utilized During Treatment: Gait belt;Oxygen Activity Tolerance: Patient limited by fatigue Patient left: in bed;with call bell/phone within reach;with bed alarm set;with family/visitor present (bed in chair position) Nurse Communication: Mobility status         Time: [id number] PT Time Calculation (min) (ACUTE ONLY): 38 min   Charges:   PT Evaluation $PT Eval Moderate Complexity: 1 Procedure PT Treatments $Therapeutic Activity: 8-22 mins $Self Care/Home Management: 8-22   PT G Codes:        Godfrey Pick Mujtaba Bollig Oct 27, 2016, 12:21 PM Hyannis Dejour Vos,PT Acute Rehabilitation [phone] (pager)

## 2016-09-30 NOTE — Progress Notes (Signed)
PROGRESS NOTE    Tara [name]  [id number] DOB: 1946/01/14 DOA: 09/28/2016 PCP: [name], MD    Brief Narrative:  71 year old with medical history significant for remote breast cancer, nonischemic cardiomyopathy, GERD who presented complaining of generalized weakness also complaining of shortness of breath.  Further evaluation revealed DVT of left lower extremity which patient was started on heparin drip. Plans were made to reassess patient with CT angiogram to assess for PE  Assessment & Plan:   Principal Problem:   HCAP (healthcare-associated pneumonia) - Patient currently on cefepime and vancomycin. Will obtain CT angiogram of chest rule out PE. Currently covering with heparin given new diagnosis of left lower extremity DVT  Reported history of S CHF - echo pending - consulted cardiology - started on lasix.  Left lower extremity DVT -Pharmacy consult placed for heparin administration.  Active Problems:   Nonischemic cardiomyopathy (HCC) - No chest pain reported. Continue statin    Prediabetes - Blood sugars elevated. We'll place on diabetic diet    Acute respiratory alkalosis - Assessing for PE    Hypokalemia - replace and reassess - Reassess BMP    Prolonged Q-T interval on ECG - Obtain EKG next a.m. with improvement in potassium levels.    Chronic kidney disease (CKD), stage III (moderate)    Elevated alkaline phosphatase level - Reassess next a.m.   Elevated LFTs   Elevated troponin - Trending down currently. Will assess for PE as mentioned above. In context of patient with shortness of breath with elevated serum creatinine 1.3. Suspect most likely demand ischemia. Patient not complaining of any chest pain. - cardiology consulted.    Anemia - No active bleeding. Stable we'll reassess next a.m.  DVT prophylaxis: Heparin gtt Code Status: Full Family Communication: Discussed with spouse Disposition Plan: Pending workup and improvement in  condition   Consultants:   None   Procedures: CT angiogram of chest pending   Antimicrobials: Cefepime and vancomycin   Subjective: Pt feels better today. Reports urinating a lot on lasix.  Objective: Vitals:   09/29/16 1339 09/29/16 2103 09/30/16 0422 09/30/16 1415  BP: 134/72 (!) 171/87 (!) 148/88 139/87  Pulse: 77 88 89 81  Resp: 20 20 (!) 21 20  Temp: 98.1 F (36.7 C) 98.2 F (36.8 C) 97.6 F (36.4 C) 97.7 F (36.5 C)  TempSrc: Oral  Axillary Oral  SpO2: 94% 94% 97% 95%  Weight:        Intake/Output Summary (Last 24 hours) at 09/30/16 1714 Last data filed at 09/30/16 1538  Gross per 24 hour  Intake              860 ml  Output             2725 ml  Net            -1865 ml   Filed Weights   09/28/16 2101  Weight: 87.1 kg (192 lb 1.6 oz)    Examination:  General exam: Appears calm and comfortable , In no acute distress Respiratory system: Positive rales, equal chest rise, no wheezes Cardiovascular system: S1 & S2 heard, RRR. No JVD, murmurs,  Gastrointestinal system: Abdomen is nondistended, soft and nontender.  Central nervous system: Alert and oriented. No focal neurological deficits. Extremities: Symmetric 5 x 5 power. No facial asymmetry Skin: No rashes, lesions or ulcers, on limited exam Psychiatry: Judgement and insight appear normal. Mood & affect appropriate.   Data Reviewed: I have personally reviewed following labs and imaging studies  CBC:  Recent Labs Lab 09/28/16 1605 09/28/16 1624 09/29/16 0546 09/30/16 0537  WBC 9.0  --  10.7* 11.0*  NEUTROABS 4.7  --  5.0  --   HGB 10.6* 12.6 9.8* 10.5*  HCT 33.0* 37.0 30.4* 33.5*  MCV 81.7  --  81.9 82.5  PLT 234  --  221 [id number]   Basic Metabolic Panel:  Recent Labs Lab 09/28/16 1605 09/28/16 1624 09/29/16 0546 09/30/16 0537  NA 142 147* 145 141  K 2.6* 2.6* 3.1* 3.0*  CL 109 106 115* 111  CO2 23  --  24 21*  GLUCOSE 135* 138* 93 87  BUN 20 21* 18 13  CREATININE 1.47* 1.30* 1.39*  1.18*  CALCIUM 8.3*  --  7.9* 8.3*  MG 2.1  --   --   --    GFR: Estimated Creatinine Clearance: 40.6 mL/min (by C-G formula based on SCr of 1.18 mg/dL (H)). Liver Function Tests:  Recent Labs Lab 09/28/16 1605 09/29/16 0546 09/30/16 0537  AST 75* 60* 78*  ALT 90* 74* 84*  ALKPHOS 425* 352* 493*  BILITOT 1.1 0.5 1.1  PROT 7.3 6.9 7.0  ALBUMIN 3.1* 2.7* 2.7*   No results for input(s): LIPASE, AMYLASE in the last 168 hours. No results for input(s): AMMONIA in the last 168 hours. Coagulation Profile:  Recent Labs Lab 09/28/16 1605  INR 1.43   Cardiac Enzymes:  Recent Labs Lab 09/29/16 0031 09/29/16 0546 09/29/16 1157  TROPONINI 0.61* 0.62* 0.52*   BNP (last 3 results) No results for input(s): PROBNP in the last 8760 hours. HbA1C: No results for input(s): HGBA1C in the last 72 hours. CBG: No results for input(s): GLUCAP in the last 168 hours. Lipid Profile: No results for input(s): CHOL, HDL, LDLCALC, TRIG, CHOLHDL, LDLDIRECT in the last 72 hours. Thyroid Function Tests: No results for input(s): TSH, T4TOTAL, FREET4, T3FREE, THYROIDAB in the last 72 hours. Anemia Panel: No results for input(s): VITAMINB12, FOLATE, FERRITIN, TIBC, IRON, RETICCTPCT in the last 72 hours. Sepsis Labs: No results for input(s): PROCALCITON, LATICACIDVEN in the last 168 hours.  Recent Results (from the past 240 hour(s))  Culture, blood (routine x 2) Call MD if unable to obtain prior to antibiotics being given     Status: None (Preliminary result)   Collection Time: 09/28/16 10:00 PM  Result Value Ref Range Status   Specimen Description BLOOD LEFT ANTECUBITAL  Final   Special Requests BOTTLES DRAWN AEROBIC AND ANAEROBIC 10CC EA  Final   Culture NO GROWTH 2 DAYS  Final   Report Status PENDING  Incomplete  Culture, blood (routine x 2) Call MD if unable to obtain prior to antibiotics being given     Status: None (Preliminary result)   Collection Time: 09/28/16 10:07 PM  Result Value  Ref Range Status   Specimen Description BLOOD LEFT HAND  Final   Special Requests IN PEDIATRIC BOTTLE 3CC  Final   Culture NO GROWTH 2 DAYS  Final   Report Status PENDING  Incomplete         Radiology Studies: Dg Chest 2 View  Result Date: 09/28/2016 CLINICAL DATA:  71 year old with acute onset of shortness of breath and generalized weakness that began yesterday, now associated with slurred speech. Personal history of right breast cancer post lumpectomy in 2008. EXAM: CHEST  2 VIEW COMPARISON:  08/25/2016, 06/22/2016 and earlier. FINDINGS: AP semi-erect and lateral images were obtained. Cardiac silhouette mildly to moderately enlarged, unchanged when allowing for differences in technique. New focal opacity  in the superior segment right lower lobe since the examination 1 month ago. Lungs otherwise clear. No pleural effusions. Normal pulmonary vascularity. Calcified right paratracheal and right hilar lymph nodes again noted. Visualized bony thorax intact. IMPRESSION: 1. Acute pneumonia suspected involving the superior segment of the right lower lobe. 2. Stable cardiomegaly without pulmonary edema. 3. Calcified right paratracheal and right hilar lymph nodes indicating old granulomatous disease. Electronically Signed   By: Evangeline Dakin M.D.   On: 09/28/2016 18:17   Ct Head Wo Contrast  Result Date: 09/28/2016 CLINICAL DATA:  Slurred speech. History of meningioma of the planum sphenoidale region. EXAM: CT HEAD WITHOUT CONTRAST TECHNIQUE: Contiguous axial images were obtained from the base of the skull through the vertex without intravenous contrast. COMPARISON:  Brain MRI March 05, 2016 FINDINGS: Brain: The ventricles are normal in size and configuration. The previously noted meningioma in the planum sphenoidale region is again noted, less than optimally seen on noncontrast enhanced study. It currently measures 2.9 x 2.3 x 1.8 cm, slightly larger than on prior study. There is no surrounding edema, and  this meningioma does not cause significant mass effect. There is no other mass evident. There is no hemorrhage, extra-axial fluid collection, or midline shift. Mild basal ganglia calcification bilaterally is felt to be physiologic in this age group. No evident acute infarct. Vascular: There is no vascular calcification evident. No hyperdense vessel. Skull: The bony calvarium appears intact. Sinuses/Orbits: Visualized paranasal sinuses are clear except for modest mucosal thickening in the right sphenoid sinus region. Frontal sinuses are hypoplastic. Visualized orbits appear symmetric bilaterally. Other: Mastoid air cells are clear. There is debris in both external auditory canals. IMPRESSION: Planum sphenoidale region meningioma again noted, modestly increased in size from approximately 10 years prior. No surrounding edema. No new mass evident. No hemorrhage, extra-axial fluid collection, or midline shift. No evidence suggesting acute infarct. Debris in each external auditory canal, likely cerumen. Minimal right sphenoid sinus disease. Electronically Signed   By: Lowella Grip III M.D.   On: 09/28/2016 17:34   Ct Angio Chest Pe W Or Wo Contrast  Result Date: 09/29/2016 CLINICAL DATA:  Shortness of breath. EXAM: CT ANGIOGRAPHY CHEST WITH CONTRAST TECHNIQUE: Multidetector CT imaging of the chest was performed using the standard protocol during bolus administration of intravenous contrast. Multiplanar CT image reconstructions and MIPs were obtained to evaluate the vascular anatomy. CONTRAST:  80 cc Isovue 370 COMPARISON:  None. FINDINGS: Examination is limited by obesity and by breathing motion artifact. Chest wall: No breast masses, supraclavicular or axillary lymphadenopathy. Cardiovascular: The heart is enlarged. No pericardial effusion. There is some contrast in the left atrium and faint contrast on left ventricle. I do not see any contrast in the aorta worn the pulmonary veins. Patient may have had a test  bolus. Significant reflux down the IVC and into the hepatic veins. There is also reflux back into the azygos vein. The pulmonary arterial tree is fairly well opacified. No filling defects to suggest pulmonary embolism. Mediastinum/Nodes: No mediastinal or hilar mass or lymphadenopathy. Scattered lymph nodes are noted. Diffuse esophageal wall thickening could be due to esophagitis or collagen vascular disease. Lungs/Pleura: Limited examination due to breathing motion artifact. A calcified granuloma is noted in the right lower lobe. Patchy peripheral infiltrate in the right lower lobe corresponding with the recent chest x-ray. Is also peribronchial thickening and patchy airspace opacity in the left lower lobe. Date small right pleural effusion is noted. No left pleural effusion. Upper Abdomen: No significant  upper abdominal findings. Musculoskeletal: No significant bony findings. Review of the MIP images confirms the above findings. IMPRESSION: 1. No CT findings for pulmonary embolism. 2. Normal caliber thoracic aorta. 3. Significant reflux of contrast down the IVC likely due to tricuspid regurgitation. 4. Diffusely thickened esophageal wall could be due to esophagitis or collagen vascular disease. Recommend correlation with recent endoscopy. 5. Bibasilar infiltrates and right pleural effusion. Electronically Signed   By: Marijo Sanes M.D.   On: 09/29/2016 18:19   Scheduled Meds: . ceFEPime (MAXIPIME) IV  1 g Intravenous Q12H  . clonazePAM  1 mg Oral QHS  . docusate sodium  100 mg Oral BID  . fluticasone  2 spray Each Nare Daily  . furosemide  40 mg Intravenous BID  . metoCLOPramide  5 mg Oral BID  . [START ON 10/01/2016] metoprolol tartrate  25 mg Oral BID  . pantoprazole  40 mg Oral BID  . pravastatin  40 mg Oral q1800  . [START ON 10/01/2016] sacubitril-valsartan  1 tablet Oral BID  . vancomycin  750 mg Intravenous Q12H   Continuous Infusions: . heparin 1,000 Units/hr (09/30/16 0549)     LOS: 2  days   Time spent: > 35 minutes  Velvet Bathe, MD [facility] Pager [phone]  If 7PM-7AM, please contact night-coverage [email or web address] Password [email or web address] 09/30/2016, 5:14 PM

## 2016-09-30 NOTE — Progress Notes (Signed)
ANTICOAGULATION CONSULT NOTE - Follow up  Pharmacy Consult for heparin Indication: DVT  Allergies  Allergen Reactions  . Aspirin Other (See Comments)    Reports GI bleed with daily use  . Lisinopril Cough  . Sudafed [Pseudoephedrine Hcl]     Pt reports she had drainage in throat that made her throat hurt.     Patient Measurements: Weight: 192 lb 1.6 oz (87.1 kg) Heparin Dosing Weight: 62 kg  Vital Signs: Temp: 97.6 F (36.4 C) (01/09 0422) Temp Source: Axillary (01/09 0422) BP: 148/88 (01/09 0422) Pulse Rate: 89 (01/09 0422)  Labs:  Recent Labs  09/28/16 1605 09/28/16 1624 09/29/16 0031 09/29/16 0546 09/29/16 1157 09/29/16 1937 09/30/16 0537  HGB 10.6* 12.6  --  9.8*  --   --  10.5*  HCT 33.0* 37.0  --  30.4*  --   --  33.5*  PLT 234  --   --  221  --   --  213  APTT 34  --   --   --   --   --   --   LABPROT 17.6*  --   --   --   --   --   --   INR 1.43  --   --   --   --   --   --   HEPARINUNFRC  --   --   --   --   --  0.50 0.59  CREATININE 1.47* 1.30*  --  1.39*  --   --  1.18*  TROPONINI  --   --  0.61* 0.62* 0.52*  --   --     Estimated Creatinine Clearance: 40.6 mL/min (by C-G formula based on SCr of 1.18 mg/dL (H)).   Assessment: 71 yo F with new LLE DVT, started on IV heparin infusion 09/29/16.  CT chest PE 1/8: Neg for pulmonary embolism  Hg stable at 10.5, PLTC WNL. Heparin level therapeutic at 0.59 on 1000 units/hr. No bleeding reported.     Goal of Therapy:  Heparin level 0.3-0.7 units/ml Monitor platelets by anticoagulation protocol: Yes   Plan:  Continue IV heparin drip 1000 units/hr Daily HL and CBC F/u transition to PO agent  Eudelia Bunch, Pharm.D. BP:[phone] 09/30/2016 1:45 PM

## 2016-09-30 NOTE — Progress Notes (Signed)
OT Cancellation Note  Patient Details Name: Tara [name] MRN: [id number] DOB: 10-22-1945   Cancelled Treatment:    Reason Eval/Treat Not Completed: Other Pt eating lunch.  Will reattempt.   Tara [name], OTR/L [id number]   Lucille Passy M 09/30/2016, 1:39 PM

## 2016-10-01 ENCOUNTER — Inpatient Hospital Stay (HOSPITAL_COMMUNITY): Payer: Medicare Other

## 2016-10-01 ENCOUNTER — Other Ambulatory Visit: Payer: Self-pay

## 2016-10-01 DIAGNOSIS — I509 Heart failure, unspecified: Secondary | ICD-10-CM

## 2016-10-01 DIAGNOSIS — N183 Chronic kidney disease, stage 3 (moderate): Secondary | ICD-10-CM

## 2016-10-01 DIAGNOSIS — R748 Abnormal levels of other serum enzymes: Secondary | ICD-10-CM

## 2016-10-01 LAB — ECHOCARDIOGRAM COMPLETE
AOASC: 31 cm
Area-P 1/2: 1.83 cm2
CHL CUP DOP CALC LVOT VTI: 13.7 cm
CHL CUP LVOT MV VTI INDEX: 1.17 cm2/m2
CHL CUP LVOT MV VTI: 2.07
CHL CUP MV DEC (S): 180
E decel time: 180 msec
E/e' ratio: 11.33
FS: 11 % — AB (ref 28–44)
IV/PV OW: 0.94
LA diam end sys: 40 mm
LA diam index: 2.26 cm/m2
LASIZE: 40 mm
LAVOL: 60.7 mL
LAVOLA4C: 51.6 mL
LAVOLIN: 34.3 mL/m2
LV PW d: 11 mm — AB (ref 0.6–1.1)
LV TDI E'LATERAL: 7.87
LV e' LATERAL: 7.87 cm/s
LVEEAVG: 11.33
LVEEMED: 11.33
LVOT area: 3.8 cm2
LVOTD: 22 mm
LVOTPV: 83.5 cm/s
LVOTSV: 52 mL
Lateral S' vel: 7.3 cm/s
MV M vel: 51.9
MV pk A vel: 40.7 m/s
MVANNULUSVTI: 25.2 cm
MVG: 1 mmHg
MVPG: 3 mmHg
MVPKEVEL: 89.2 m/s
P 1/2 time: 120 ms
RV TAPSE: 14.8 mm
RV sys press: 35 mmHg
Reg peak vel: 259 cm/s
TDI e' medial: 4.22
TR max vel: 259 cm/s
Weight: 3073.6 oz

## 2016-10-01 LAB — BASIC METABOLIC PANEL
ANION GAP: 8 (ref 5–15)
BUN: 11 mg/dL (ref 6–20)
CHLORIDE: 103 mmol/L (ref 101–111)
CO2: 29 mmol/L (ref 22–32)
CREATININE: 1.31 mg/dL — AB (ref 0.44–1.00)
Calcium: 8.1 mg/dL — ABNORMAL LOW (ref 8.9–10.3)
GFR calc Af Amer: 47 mL/min — ABNORMAL LOW (ref >60)
GFR calc non Af Amer: 40 mL/min — ABNORMAL LOW (ref >60)
Glucose, Bld: 92 mg/dL (ref 65–99)
POTASSIUM: 2.5 mmol/L — AB (ref 3.5–5.1)
SODIUM: 140 mmol/L (ref 135–145)

## 2016-10-01 LAB — CBC
HEMATOCRIT: 32.9 % — AB (ref 36.0–46.0)
HEMOGLOBIN: 10.6 g/dL — AB (ref 12.0–15.0)
MCH: 26.3 pg (ref 26.0–34.0)
MCHC: 32.2 g/dL (ref 30.0–36.0)
MCV: 81.6 fL (ref 78.0–100.0)
Platelets: 218 10*3/uL (ref 150–400)
RBC: 4.03 MIL/uL (ref 3.87–5.11)
RDW: 22 % — ABNORMAL HIGH (ref 11.5–15.5)
WBC: 12.7 10*3/uL — AB (ref 4.0–10.5)

## 2016-10-01 LAB — HEPARIN LEVEL (UNFRACTIONATED): HEPARIN UNFRACTIONATED: 0.57 [IU]/mL (ref 0.30–0.70)

## 2016-10-01 MED ORDER — POTASSIUM CHLORIDE 20 MEQ/15ML (10%) PO SOLN
40.0000 meq | Freq: Once | ORAL | Status: AC
  Administered 2016-10-01: 40 meq via ORAL
  Filled 2016-10-01: qty 30

## 2016-10-01 MED ORDER — SODIUM CHLORIDE 0.9 % IV SOLN
30.0000 meq | Freq: Once | INTRAVENOUS | Status: AC
  Administered 2016-10-01: 30 meq via INTRAVENOUS
  Filled 2016-10-01: qty 15

## 2016-10-01 MED ORDER — RIVAROXABAN (XARELTO) EDUCATION KIT FOR DVT/PE PATIENTS
PACK | Freq: Once | Status: AC
  Administered 2016-10-01: 15:00:00
  Filled 2016-10-01: qty 1

## 2016-10-01 MED ORDER — FUROSEMIDE 10 MG/ML IJ SOLN
20.0000 mg | Freq: Two times a day (BID) | INTRAMUSCULAR | Status: DC
  Administered 2016-10-01: 20 mg via INTRAVENOUS
  Filled 2016-10-01: qty 2

## 2016-10-01 MED ORDER — RIVAROXABAN 15 MG PO TABS
15.0000 mg | ORAL_TABLET | Freq: Two times a day (BID) | ORAL | Status: DC
  Administered 2016-10-01 – 2016-10-03 (×5): 15 mg via ORAL
  Filled 2016-10-01 (×5): qty 1

## 2016-10-01 MED ORDER — RIVAROXABAN 20 MG PO TABS: 20.0000 mg | ORAL_TABLET | Freq: Every day | ORAL | Status: DC

## 2016-10-01 NOTE — Progress Notes (Signed)
Physical Therapy Treatment Patient Details Name: Tara [name] MRN: [id number] DOB: 1946-07-22 Today's Date: 10/01/2016    History of Present Illness 71 year old with medical history significant for remote breast cancer, nonischemic cardiomyopathy, GERD who presented complaining of generalized weakness also complaining of shortness of breath.  Treated with LAsix for volume overload.  Also positive for Left LE DVT.      PT Comments    Pt seen for co-treatment with OT to progress mobility. Pt requires encouragement throughout to progress mobility and education with both patient and husband on importance of OOB and mobility. Pt able to complete transfer to recliner and walk a few steps today. Continue to recommend SNF for short-term rehab to continue to advance independence with mobility and allow for safe d/c home (pt with h/o of several falls). Pt requires frequent rest breaks and encouragement to participate. Pt on 3L supplemental O2 with increased effort of breathing during mobility but sats remained 97%.   Follow Up Recommendations  SNF;Supervision/Assistance - 24 hour     Equipment Recommendations  Rolling walker with 5" wheels    Recommendations for Other Services       Precautions / Restrictions Restrictions Weight Bearing Restrictions: No    Mobility  Bed Mobility Overal bed mobility: Needs Assistance Bed Mobility: Supine to Sit;Rolling Rolling: Min assist   Supine to sit: Min assist     General bed mobility comments: Cues for technique and hand placement. Extra time for tasks. Pt wet with urine in bed, so completed rolling with min assist using rails to remove soiled linen. Pt performed peri-care  Transfers Overall transfer level: Needs assistance Equipment used: 2 person hand held assist Transfers: Sit to/from [facility] Sit to Stand: Min assist Stand pivot transfers: Min assist (PT/OT present but did not require 2 person assist today)        General transfer comment: PT/OT present due to recommended level of assist on evaluation, but pt required overall min assist for transfer. If pt has UE support, anticipate she could transfer with 1 person  Ambulation/Gait Ambulation/Gait assistance: Min assist;+2 physical assistance (+2 for handheld assist only) Ambulation Distance (Feet): 3 Feet Assistive device: 2 person hand held assist       General Gait Details: Pt able to gait a few feet to recliner with 2 person handheld assist with encouragement. Anticipate patient would be able to transfer with 1 person with RW.   Stairs            Wheelchair Mobility    Modified Rankin (Stroke Patients Only)       Balance Overall balance assessment: Needs assistance;History of Falls Sitting-balance support: No upper extremity supported;Feet supported Sitting balance-Leahy Scale: Good Sitting balance - Comments: pt donned socks EOB                             Cognition Arousal/Alertness: Awake/alert Behavior During Therapy: WFL for tasks assessed/performed Overall Cognitive Status: Within Functional Limits for tasks assessed                      Exercises      General Comments General comments (skin integrity, edema, etc.): pt with frequency and urgency of urine due to medication      Pertinent Vitals/Pain Pain Assessment: No/denies pain    Home Living  Prior Function            PT Goals (current goals can now be found in the care plan section) Acute Rehab PT Goals Patient Stated Goal: to get better PT Goal Formulation: With patient/family Time For Goal Achievement: 10/14/16 Potential to Achieve Goals: Fair Progress towards PT goals: Progressing toward goals    Frequency    Min 3X/week      PT Plan Current plan remains appropriate    Co-evaluation PT/OT/SLP Co-Evaluation/Treatment: Yes Reason for Co-Treatment: To address functional/ADL  transfers;Complexity of the patient's impairments (multi-system involvement) PT goals addressed during session: Mobility/safety with mobility;Balance;Proper use of DME;Strengthening/ROM       End of Session Equipment Utilized During Treatment: Oxygen Activity Tolerance: Patient tolerated treatment well Patient left: in chair;with call bell/phone within reach;with family/visitor present     Time: 0920-0950 PT Time Calculation (min) (ACUTE ONLY): 30 min  Charges:  $Therapeutic Activity: 8-22 mins                    G Codes:      [name], PT, DPT Pager #: [phone] 10/01/2016, 10:09 AM

## 2016-10-01 NOTE — Discharge Instructions (Signed)
Information on my medicine - XARELTO (rivaroxaban)  This medication education was reviewed with me or my healthcare representative as part of my discharge preparation.  The pharmacist that spoke with me during my hospital stay was:  Eudelia Bunch, [place]? Xarelto was prescribed to treat blood clots that may have been found in the veins of your legs (deep vein thrombosis) or in your lungs (pulmonary embolism) and to reduce the risk of them occurring again.  What do you need to know about Xarelto? The starting dose is one 15 mg tablet taken TWICE daily with food for the FIRST 21 DAYS then on Wednesday 10/22/16  the dose is changed to one 20 mg tablet taken ONCE A DAY with your evening meal.  DO NOT stop taking Xarelto without talking to the health care provider who prescribed the medication.  Refill your prescription for 20 mg tablets before you run out.  After discharge, you should have regular check-up appointments with your healthcare provider that is prescribing your Xarelto.  In the future your dose may need to be changed if your kidney function changes by a significant amount.  What do you do if you miss a dose? If you are taking Xarelto TWICE DAILY and you miss a dose, take it as soon as you remember. You may take two 15 mg tablets (total 30 mg) at the same time then resume your regularly scheduled 15 mg twice daily the next day.  If you are taking Xarelto ONCE DAILY and you miss a dose, take it as soon as you remember on the same day then continue your regularly scheduled once daily regimen the next day. Do not take two doses of Xarelto at the same time.   Important Safety Information Xarelto is a blood thinner medicine that can cause bleeding. You should call your healthcare provider right away if you experience any of the following: ? Bleeding from an injury or your nose that does not stop. ? Unusual colored urine (red or dark brown) or  unusual colored stools (red or black). ? Unusual bruising for unknown reasons. ? A serious fall or if you hit your head (even if there is no bleeding).  Some medicines may interact with Xarelto and might increase your risk of bleeding while on Xarelto. To help avoid this, consult your healthcare provider or pharmacist prior to using any new prescription or non-prescription medications, including herbals, vitamins, non-steroidal anti-inflammatory drugs (NSAIDs) and supplements.  This website has more information on Xarelto: [email or web address] on my medicine - XARELTO (rivaroxaban)  This medication education was reviewed with me or my healthcare representative as part of my discharge preparation.  The pharmacist that spoke with me during my hospital stay was:  Eudelia Bunch, [place]? Xarelto was prescribed to treat blood clots that may have been found in the veins of your legs (deep vein thrombosis) or in your lungs (pulmonary embolism) and to reduce the risk of them occurring again.  What do you need to know about Xarelto? The starting dose is one 15 mg tablet taken TWICE daily with food for the FIRST 21 DAYS then on Wed 10/22/16  tablet taken ONCE A DAY with your evening meal.  DO NOT stop taking Xarelto without talking to the health care provider who prescribed the medication.  Refill your prescription for 20 mg tablets before you run out.  After discharge, you should have regular check-up appointments with  your healthcare provider that is prescribing your Xarelto.  In the future your dose may need to be changed if your kidney function changes by a significant amount.  What do you do if you miss a dose? If you are taking Xarelto TWICE DAILY and you miss a dose, take it as soon as you remember. You may take two 15 mg tablets (total 30 mg) at the same time then resume your regularly scheduled 15 mg twice daily the next day.  If you are taking  Xarelto ONCE DAILY and you miss a dose, take it as soon as you remember on the same day then continue your regularly scheduled once daily regimen the next day. Do not take two doses of Xarelto at the same time.   Important Safety Information Xarelto is a blood thinner medicine that can cause bleeding. You should call your healthcare provider right away if you experience any of the following: ? Bleeding from an injury or your nose that does not stop. ? Unusual colored urine (red or dark brown) or unusual colored stools (red or black). ? Unusual bruising for unknown reasons. ? A serious fall or if you hit your head (even if there is no bleeding).  Some medicines may interact with Xarelto and might increase your risk of bleeding while on Xarelto. To help avoid this, consult your healthcare provider or pharmacist prior to using any new prescription or non-prescription medications, including herbals, vitamins, non-steroidal anti-inflammatory drugs (NSAIDs) and supplements.  This website has more information on Xarelto: [email or web address]-[email or web address].[email or web address]

## 2016-10-01 NOTE — Progress Notes (Signed)
Patient Name: Tara [name] Date of Encounter: 10/01/2016  Primary Cardiologist: Dr. Kearney Hard Problem List     Principal Problem:   HCAP (healthcare-associated pneumonia) Active Problems:   Nonischemic cardiomyopathy ([place])   Prediabetes   Leg edema, left   Acute respiratory alkalosis   Hypokalemia   Prolonged Q-T interval on ECG   Chronic kidney disease (CKD), stage III (moderate)   Elevated alkaline phosphatase level   Elevated LFTs   Elevated troponin   Elevated brain natriuretic peptide (BNP) level   Anemia   DVT (deep venous thrombosis) (HCC)   Acute on chronic combined systolic and diastolic heart failure (HCC)   Demand ischemia (HCC)     Subjective   Breathing has improved from yesterday but is still labored.    Inpatient Medications    Scheduled Meds: . ceFEPime (MAXIPIME) IV  1 g Intravenous Q12H  . clonazePAM  1 mg Oral QHS  . docusate sodium  100 mg Oral BID  . fluticasone  2 spray Each Nare Daily  . furosemide  40 mg Intravenous BID  . metoCLOPramide  5 mg Oral BID  . metoprolol tartrate  25 mg Oral BID  . pantoprazole  40 mg Oral BID  . potassium chloride (KCL MULTIRUN) 30 mEq in 265 mL IVPB  30 mEq Intravenous Once  . pravastatin  40 mg Oral q1800  . sacubitril-valsartan  1 tablet Oral BID  . vancomycin  750 mg Intravenous Q12H   Continuous Infusions: . heparin 1,000 Units/hr (10/01/16 0635)   PRN Meds: zolpidem   Vital Signs    Vitals:   09/30/16 1415 09/30/16 2046 10/01/16 0425 10/01/16 0800  BP: 139/87 134/71 (!) 141/69 134/66  Pulse: 81 68 73 79  Resp: 20 20 18    Temp: 97.7 F (36.5 C) 98 F (36.7 C) 98.5 F (36.9 C)   TempSrc: Oral Oral Oral   SpO2: 95% 96% 95%   Weight:        Intake/Output Summary (Last 24 hours) at 10/01/16 1005 Last data filed at 10/01/16 0600  Gross per 24 hour  Intake           976.83 ml  Output             4975 ml  Net         -3998.17 ml   Filed Weights   09/28/16 2101  Weight: 87.1  kg (192 lb 1.6 oz)    Physical Exam    GEN: Well nourished, well developed, in no acute distress.  HEENT: Grossly normal.  Neck: Supple, no JVD, carotid bruits, or masses. Cardiac: RRR, no murmurs, rubs, or gallops.  Extremities: 2+ pitting edema bilaterally.   Radials/DP/PT 2+ and equal bilaterally.  No clubbing, cyanosis, edema.  Respiratory:  Respirations regular and mildly labored.  Faint diffuse expiratory wheezes and bibasilar crackles.  GI: Soft, nontender, nondistended, BS + x 4. MS: no deformity or atrophy. Skin: warm and dry, no rash. Neuro:  Strength and sensation are intact. Psych: AAOx3.  Normal affect.  Labs    CBC  Recent Labs  09/28/16 1605  09/29/16 0546 09/30/16 0537 10/01/16 0350  WBC 9.0  --  10.7* 11.0* 12.7*  NEUTROABS 4.7  --  5.0  --   --   HGB 10.6*  < > 9.8* 10.5* 10.6*  HCT 33.0*  < > 30.4* 33.5* 32.9*  MCV 81.7  --  81.9 82.5 81.6  PLT 234  --  221 213 218  < > =  values in this interval not displayed. Basic Metabolic Panel  Recent Labs  09/28/16 1605  09/30/16 0537 10/01/16 0350  NA 142  < > 141 140  K 2.6*  < > 3.0* 2.5*  CL 109  < > 111 103  CO2 23  < > 21* 29  GLUCOSE 135*  < > 87 92  BUN 20  < > 13 11  CREATININE 1.47*  < > 1.18* 1.31*  CALCIUM 8.3*  < > 8.3* 8.1*  MG 2.1  --   --   --   < > = values in this interval not displayed. Liver Function Tests  Recent Labs  09/29/16 0546 09/30/16 0537  AST 60* 78*  ALT 74* 84*  ALKPHOS 352* 493*  BILITOT 0.5 1.1  PROT 6.9 7.0  ALBUMIN 2.7* 2.7*   No results for input(s): LIPASE, AMYLASE in the last 72 hours. Cardiac Enzymes  Recent Labs  09/29/16 0031 09/29/16 0546 09/29/16 1157  TROPONINI 0.61* 0.62* 0.52*   BNP Invalid input(s): POCBNP D-Dimer No results for input(s): DDIMER in the last 72 hours. Hemoglobin A1C No results for input(s): HGBA1C in the last 72 hours. Fasting Lipid Panel No results for input(s): CHOL, HDL, LDLCALC, TRIG, CHOLHDL, LDLDIRECT in the  last 72 hours. Thyroid Function Tests No results for input(s): TSH, T4TOTAL, T3FREE, THYROIDAB in the last 72 hours.  Invalid input(s): FREET3  Telemetry    n/a  ECG    10/01/16: Sinus rhythm rate 78 bpm.  LAD.  LVH.  - Personally Reviewed  Radiology    Ct Angio Chest Pe W Or Wo Contrast  Result Date: 09/29/2016 CLINICAL DATA:  Shortness of breath. EXAM: CT ANGIOGRAPHY CHEST WITH CONTRAST TECHNIQUE: Multidetector CT imaging of the chest was performed using the standard protocol during bolus administration of intravenous contrast. Multiplanar CT image reconstructions and MIPs were obtained to evaluate the vascular anatomy. CONTRAST:  80 cc Isovue 370 COMPARISON:  None. FINDINGS: Examination is limited by obesity and by breathing motion artifact. Chest wall: No breast masses, supraclavicular or axillary lymphadenopathy. Cardiovascular: The heart is enlarged. No pericardial effusion. There is some contrast in the left atrium and faint contrast on left ventricle. I do not see any contrast in the aorta worn the pulmonary veins. Patient may have had a test bolus. Significant reflux down the IVC and into the hepatic veins. There is also reflux back into the azygos vein. The pulmonary arterial tree is fairly well opacified. No filling defects to suggest pulmonary embolism. Mediastinum/Nodes: No mediastinal or hilar mass or lymphadenopathy. Scattered lymph nodes are noted. Diffuse esophageal wall thickening could be due to esophagitis or collagen vascular disease. Lungs/Pleura: Limited examination due to breathing motion artifact. A calcified granuloma is noted in the right lower lobe. Patchy peripheral infiltrate in the right lower lobe corresponding with the recent chest x-ray. Is also peribronchial thickening and patchy airspace opacity in the left lower lobe. Date small right pleural effusion is noted. No left pleural effusion. Upper Abdomen: No significant upper abdominal findings. Musculoskeletal: No  significant bony findings. Review of the MIP images confirms the above findings. IMPRESSION: 1. No CT findings for pulmonary embolism. 2. Normal caliber thoracic aorta. 3. Significant reflux of contrast down the IVC likely due to tricuspid regurgitation. 4. Diffusely thickened esophageal wall could be due to esophagitis or collagen vascular disease. Recommend correlation with recent endoscopy. 5. Bibasilar infiltrates and right pleural effusion. Electronically Signed   By: Marijo Sanes M.D.   On: 09/29/2016 18:19  Cardiac Studies   Echo 03/21/16: Study Conclusions  - Left ventricle: The cavity size was mildly dilated. Wall   thickness was normal. Systolic function was moderately reduced.   The estimated ejection fraction was in the range of 35% to 40%.   The study is not technically sufficient to allow evaluation of LV   diastolic function. - Aortic valve: Valve area (VTI): 1.99 cm^2. Valve area (Vmax):   2.19 cm^2. Valve area (Vmean): 2.23 cm^2. - Mitral valve: There was mild regurgitation. - Left atrium: The atrium was mildly to moderately dilated. - Pulmonary arteries: PA peak pressure: 49 mm Hg (S).  Patient Profile     Tara [name] is a 68F with NICM LVEF 35-40%, hypertensive heart disease, hyperlipidemia, SVT and breast cancer s/p chemo/XRT and mastectomy here with pneumonia who was noted to have a DVT and acute on chronic systolic and diastolic heart failure  Assessment & Plan    # Acute on chronic systolic and diastolic heart failure:   # Hypertensive heart disease:   Tara [name] has LVEF 35-40% and remains volume overloaded.  She is diuresing well and was net negative 3.8L.  Her creatinine increased to 1.3 BUN decreased to 11 from 13. We will decrease her Lasix to 20 mg twice daily with a goal of -1-2 L daily. She was also started on Entresto which will likely assist with diuresis. Carvedilol was switched to metoprolol to allow blood pressure room for adding Entresto.   echocardiogram  this admission is pending.   Continue potassium supplementation with the goal of greater than 4.  We will order Ted hose.    # Acute DVT: Stop heparin and start Xarelto 15mg  bid x21 days followed by 20 mg daily for a total of 3 months.   # Demand ischemia: Troponin mildly elevated in a pattern consistent with demand ischemia. She denies chest pain. No ischemia evaluation at this time unless there are significant changes on her echo.  # Hyperlipidemia:  Continue pravastatin.   Signed, Skeet Latch, MD  10/01/2016, 10:05 AM

## 2016-10-01 NOTE — Progress Notes (Signed)
CRITICAL VALUE ALERT  Critical value received:  K+=2.5  Date of notification:  10-01-2016  Time of notification:  0518  Critical value read back:Yes.    Nurse who received alert:  Isidoro Donning RN  MD notified (1st page):  Lamar Blinks NP  Time of first page:  [phone]  MD notified (2nd page): Lamar Blinks NP  Time of second HT:[id number]  Responding MD:  Lamar Blinks NP  Time MD responded:  [phone]

## 2016-10-01 NOTE — Evaluation (Signed)
Occupational Therapy Evaluation Patient Details Name: Tara [name] MRN: [id number] DOB: December 06, 1945 Today's Date: 10/01/2016    History of Present Illness 71 year old with medical history significant for remote breast cancer, nonischemic cardiomyopathy, GERD who presented complaining of generalized weakness also complaining of shortness of breath.  Treated with LAsix for volume overload.  Also positive for Left LE DVT.     Clinical Impression   Patient presenting with decreased I in self care, functional transfers/mobility, balance, strength, endurance, and safety awareness. Patient reports being mod I PTA. Patient currently functioning min - mod A. Patient will benefit from acute OT to increase overall independence in the areas of ADLs, functional mobility, and safety  in order to safely discharge to next venue of care.Pt would benefit from SNF rehab at discharge in order to continue to address strengthening and endurance that effect functional performance. Pt would continue to benefit from acute OT intervention.     Follow Up Recommendations  SNF    Equipment Recommendations  Other (comment) (defer to next venue of care)    Recommendations for Other Services       Precautions / Restrictions Precautions Precautions: Fall Restrictions Weight Bearing Restrictions: No      Mobility Bed Mobility Overal bed mobility: Needs Assistance Bed Mobility: Supine to Sit;Rolling Rolling: Min assist   Supine to sit: Min assist     General bed mobility comments: Cues for technique and hand placement. Extra time for tasks. Pt wet with urine in bed, so completed rolling with min assist using rails to remove soiled linen. Pt performed peri-care  Transfers Overall transfer level: Needs assistance Equipment used: 2 person hand held assist Transfers: Sit to/from [facility] Sit to Stand: Min assist Stand pivot transfers: Min assist       General transfer comment: PT/OT  present due to recommended level of assist on evaluation, but pt required overall min assist for transfer. If pt has UE support, anticipate she could transfer with 1 person    Balance Overall balance assessment: Needs assistance;History of Falls Sitting-balance support: No upper extremity supported;Feet supported Sitting balance-Leahy Scale: Fair Sitting balance - Comments: pt donned socks EOB               ADL Overall ADL's : Needs assistance/impaired             General ADL Comments: Upon entering the room, pt supine in bed and reports being "wet". Pt rolling with min A to L <> R in order to remove wet linens. Pt performed peri care with set up A to obtain materials. Pt sitting on EOB and donning B socks with min A for balance, increased time, and assistance to pull sock over toes and pt able to pull rest of way onto foot.                 Pertinent Vitals/Pain Pain Assessment: No/denies pain     Hand Dominance     Extremity/Trunk Assessment Upper Extremity Assessment Upper Extremity Assessment: Overall WFL for tasks assessed   Lower Extremity Assessment Lower Extremity Assessment: Defer to PT evaluation   Cervical / Trunk Assessment Cervical / Trunk Assessment: Kyphotic   Communication     Cognition Arousal/Alertness: Awake/alert Behavior During Therapy: WFL for tasks assessed/performed Overall Cognitive Status: Within Functional Limits for tasks assessed                    Home Living Family/patient expects to be discharged to:: Private residence  Living Arrangements: Spouse/significant other Available Help at Discharge: Family;Available 24 hours/day Type of Home: House Home Access: Stairs to enter [employer] of Steps: 1 Entrance Stairs-Rails: None Home Layout: Two level;Able to live on main level with bedroom/bathroom     Bathroom Shower/Tub: Teacher, early years/pre: Standard Bathroom Accessibility: Yes   Home  Equipment: None          Prior Functioning/Environment Level of Independence: Needs assistance  Gait / Transfers Assistance Needed: Pt did not use equipment per husband. But he did state that he has had to help pt off floor twice in last few weeks and that daughter had to come and help her at least once.  She has been falling and they have been struggling.               OT Problem List: Decreased strength;Decreased activity tolerance;Impaired balance (sitting and/or standing);Decreased safety awareness;Cardiopulmonary status limiting activity;Decreased knowledge of use of DME or AE   OT Treatment/Interventions: Self-care/ADL training;Therapeutic exercise;Energy conservation;DME and/or AE instruction;Therapeutic activities;Balance training;Patient/family education    OT Goals(Current goals can be found in the care plan section) Acute Rehab OT Goals Patient Stated Goal: to get better OT Goal Formulation: With patient Time For Goal Achievement: 10/15/16 Potential to Achieve Goals: Fair ADL Goals Pt Will Perform Grooming: with set-up Pt Will Perform Upper Body Bathing: with set-up;sitting Pt Will Perform Lower Body Bathing: with min assist Pt Will Perform Upper Body Dressing: with set-up;sitting Pt Will Perform Lower Body Dressing: with min assist Pt Will Transfer to Toilet: with min assist Pt Will Perform Toileting - Clothing Manipulation and hygiene: with min assist  OT Frequency: Min 2X/week   Barriers to D/C:    none known at this time       Co-evaluation PT/OT/SLP Co-Evaluation/Treatment: Yes Reason for Co-Treatment: To address functional/ADL transfers;Complexity of the patient's impairments (multi-system involvement) PT goals addressed during session: Mobility/safety with mobility;Balance;Proper use of DME;Strengthening/ROM OT goals addressed during session: ADL's and self-care;Strengthening/ROM;Proper use of Adaptive equipment and DME      End of Session Equipment  Utilized During Treatment: Rolling walker;Oxygen  Activity Tolerance: Patient limited by fatigue Patient left: in chair;with call bell/phone within reach;with family/visitor present   Time: 0920-0948 OT Time Calculation (min): 28 min Charges:  OT General Charges $OT Visit: 1 Procedure OT Evaluation $OT Eval Moderate Complexity: 1 Procedure G-Codes:    Gypsy Decant, MS, OTR/L 10/01/2016, 10:24 AM

## 2016-10-01 NOTE — Progress Notes (Addendum)
PROGRESS NOTE    Tara [name]  [id number] DOB: 1946-06-25 DOA: 09/28/2016 PCP: [name], MD    Brief Narrative:  71 year old with medical history significant for remote breast cancer, nonischemic cardiomyopathy, GERD who presented complaining of generalized weakness also complaining of shortness of breath, found to have HCAP and CHF Further evaluation revealed DVT of left lower extremity which patient was started on heparin drip.  Assessment & Plan:   HCAP (healthcare-associated pneumonia) - improving on IV cefepime and vancomycin - CTA chest with infiltrates - Stop Vancomycin, blood cx negative - transition to PO Abx in 1-2days  ACute on chronic systolic CHF -improving -on IV lasix, cards following -I/O not recorded -Repeat ECHO pending  Elevated troponin -likely due to demand in setting of CHF/HCAP, clinically no ACS -FU ECHO -cards following  Acute LLE DVT -was on IV heparin changed to xarelto now  Meningioma -sphenoidal -needs Follow up, monitoring    Prediabetes - Blood sugars elevated. We'll place on diabetic diet    Hypokalemia - replaced, mag ok    Chronic kidney disease (CKD), stage III (moderate)    Elevated alkaline phosphatase level -etiology unclear -GGT elevated too, hence hepatobiliary in origin -since trending up compared to baseline will check MRCP    Anemia - No active bleeding. Stable we'll reassess next a.m.  DVT prophylaxis: xarelto Code Status: Full Family Communication: none at bedside Disposition Plan: SNF in ?2days   Consultants:   None   Procedures:    Antimicrobials: Cefepime and vancomycin   Subjective: Breathing better, no distress  Objective: Vitals:   09/30/16 1415 09/30/16 2046 10/01/16 0425 10/01/16 0800  BP: 139/87 134/71 (!) 141/69 134/66  Pulse: 81 68 73 79  Resp: 20 20 18    Temp: 97.7 F (36.5 C) 98 F (36.7 C) 98.5 F (36.9 C)   TempSrc: Oral Oral Oral   SpO2: 95% 96% 95%   Weight:         Intake/Output Summary (Last 24 hours) at 10/01/16 1341 Last data filed at 10/01/16 0600  Gross per 24 hour  Intake           976.83 ml  Output             3475 ml  Net         -2498.17 ml   Filed Weights   09/28/16 2101  Weight: 87.1 kg (192 lb 1.6 oz)    Examination:  General exam: AAOx3, no distress Respiratory system: few basilar crcakles Cardiovascular system: S1 & S2 heard, RRR. No JVD, murmurs,  Gastrointestinal system: Abdomen is nondistended, soft and nontender.  Central nervous system: Alert and oriented. No focal neurological deficits. Extremities: Symmetric 5 x 5 power. No facial asymmetry Skin: No rashes, lesions or ulcers, on limited exam Psychiatry: Judgement and insight appear normal. Mood & affect appropriate.   Data Reviewed: I have personally reviewed following labs and imaging studies  CBC:  Recent Labs Lab 09/28/16 1605 09/28/16 1624 09/29/16 0546 09/30/16 0537 10/01/16 0350  WBC 9.0  --  10.7* 11.0* 12.7*  NEUTROABS 4.7  --  5.0  --   --   HGB 10.6* 12.6 9.8* 10.5* 10.6*  HCT 33.0* 37.0 30.4* 33.5* 32.9*  MCV 81.7  --  81.9 82.5 81.6  PLT 234  --  221 213 [id number]   Basic Metabolic Panel:  Recent Labs Lab 09/28/16 1605 09/28/16 1624 09/29/16 0546 09/30/16 0537 10/01/16 0350  NA 142 147* 145 141 140  K 2.6* 2.6* 3.1*  3.0* 2.5*  CL 109 106 115* 111 103  CO2 23  --  24 21* 29  GLUCOSE 135* 138* 93 87 92  BUN 20 21* 18 13 11   CREATININE 1.47* 1.30* 1.39* 1.18* 1.31*  CALCIUM 8.3*  --  7.9* 8.3* 8.1*  MG 2.1  --   --   --   --    GFR: Estimated Creatinine Clearance: 36.6 mL/min (by C-G formula based on SCr of 1.31 mg/dL (H)). Liver Function Tests:  Recent Labs Lab 09/28/16 1605 09/29/16 0546 09/30/16 0537  AST 75* 60* 78*  ALT 90* 74* 84*  ALKPHOS 425* 352* 493*  BILITOT 1.1 0.5 1.1  PROT 7.3 6.9 7.0  ALBUMIN 3.1* 2.7* 2.7*   No results for input(s): LIPASE, AMYLASE in the last 168 hours. No results for input(s): AMMONIA  in the last 168 hours. Coagulation Profile:  Recent Labs Lab 09/28/16 1605  INR 1.43   Cardiac Enzymes:  Recent Labs Lab 09/29/16 0031 09/29/16 0546 09/29/16 1157  TROPONINI 0.61* 0.62* 0.52*   BNP (last 3 results) No results for input(s): PROBNP in the last 8760 hours. HbA1C: No results for input(s): HGBA1C in the last 72 hours. CBG: No results for input(s): GLUCAP in the last 168 hours. Lipid Profile: No results for input(s): CHOL, HDL, LDLCALC, TRIG, CHOLHDL, LDLDIRECT in the last 72 hours. Thyroid Function Tests: No results for input(s): TSH, T4TOTAL, FREET4, T3FREE, THYROIDAB in the last 72 hours. Anemia Panel: No results for input(s): VITAMINB12, FOLATE, FERRITIN, TIBC, IRON, RETICCTPCT in the last 72 hours. Sepsis Labs: No results for input(s): PROCALCITON, LATICACIDVEN in the last 168 hours.  Recent Results (from the past 240 hour(s))  Culture, blood (routine x 2) Call MD if unable to obtain prior to antibiotics being given     Status: None (Preliminary result)   Collection Time: 09/28/16 10:00 PM  Result Value Ref Range Status   Specimen Description BLOOD LEFT ANTECUBITAL  Final   Special Requests BOTTLES DRAWN AEROBIC AND ANAEROBIC 10CC EA  Final   Culture NO GROWTH 3 DAYS  Final   Report Status PENDING  Incomplete  Culture, blood (routine x 2) Call MD if unable to obtain prior to antibiotics being given     Status: None (Preliminary result)   Collection Time: 09/28/16 10:07 PM  Result Value Ref Range Status   Specimen Description BLOOD LEFT HAND  Final   Special Requests IN PEDIATRIC BOTTLE 3CC  Final   Culture NO GROWTH 3 DAYS  Final   Report Status PENDING  Incomplete         Radiology Studies: Ct Angio Chest Pe W Or Wo Contrast  Result Date: 09/29/2016 CLINICAL DATA:  Shortness of breath. EXAM: CT ANGIOGRAPHY CHEST WITH CONTRAST TECHNIQUE: Multidetector CT imaging of the chest was performed using the standard protocol during bolus administration of  intravenous contrast. Multiplanar CT image reconstructions and MIPs were obtained to evaluate the vascular anatomy. CONTRAST:  80 cc Isovue 370 COMPARISON:  None. FINDINGS: Examination is limited by obesity and by breathing motion artifact. Chest wall: No breast masses, supraclavicular or axillary lymphadenopathy. Cardiovascular: The heart is enlarged. No pericardial effusion. There is some contrast in the left atrium and faint contrast on left ventricle. I do not see any contrast in the aorta worn the pulmonary veins. Patient may have had a test bolus. Significant reflux down the IVC and into the hepatic veins. There is also reflux back into the azygos vein. The pulmonary arterial tree is fairly  well opacified. No filling defects to suggest pulmonary embolism. Mediastinum/Nodes: No mediastinal or hilar mass or lymphadenopathy. Scattered lymph nodes are noted. Diffuse esophageal wall thickening could be due to esophagitis or collagen vascular disease. Lungs/Pleura: Limited examination due to breathing motion artifact. A calcified granuloma is noted in the right lower lobe. Patchy peripheral infiltrate in the right lower lobe corresponding with the recent chest x-ray. Is also peribronchial thickening and patchy airspace opacity in the left lower lobe. Date small right pleural effusion is noted. No left pleural effusion. Upper Abdomen: No significant upper abdominal findings. Musculoskeletal: No significant bony findings. Review of the MIP images confirms the above findings. IMPRESSION: 1. No CT findings for pulmonary embolism. 2. Normal caliber thoracic aorta. 3. Significant reflux of contrast down the IVC likely due to tricuspid regurgitation. 4. Diffusely thickened esophageal wall could be due to esophagitis or collagen vascular disease. Recommend correlation with recent endoscopy. 5. Bibasilar infiltrates and right pleural effusion. Electronically Signed   By: Marijo Sanes M.D.   On: 09/29/2016 18:19    Scheduled Meds: . ceFEPime (MAXIPIME) IV  1 g Intravenous Q12H  . clonazePAM  1 mg Oral QHS  . docusate sodium  100 mg Oral BID  . fluticasone  2 spray Each Nare Daily  . furosemide  20 mg Intravenous BID  . metoCLOPramide  5 mg Oral BID  . metoprolol tartrate  25 mg Oral BID  . pantoprazole  40 mg Oral BID  . pravastatin  40 mg Oral q1800  . rivaroxaban   Does not apply Once  . Rivaroxaban  15 mg Oral BID WC  . [START ON 10/22/2016] Rivaroxaban  20 mg Oral Q supper  . sacubitril-valsartan  1 tablet Oral BID   Continuous Infusions:    LOS: 3 days   Time spent: > 35 minutes  Domenic Polite, MD [facility] Pager [phone]  If 7PM-7AM, please contact night-coverage [email or web address] Password [email or web address] 10/01/2016, 1:41 PM

## 2016-10-01 NOTE — Progress Notes (Signed)
  Echocardiogram 2D Echocardiogram has been performed.  Tara [name] 10/01/2016, 12:11 PM

## 2016-10-01 NOTE — Progress Notes (Signed)
ANTICOAGULATION CONSULT NOTE - Follow up  Pharmacy Consult for heparin>>xarelto Indication: DVT  Allergies  Allergen Reactions  . Aspirin Other (See Comments)    Reports GI bleed with daily use  . Lisinopril Cough  . Sudafed [Pseudoephedrine Hcl]     Pt reports she had drainage in throat that made her throat hurt.     Patient Measurements: Weight: 192 lb 1.6 oz (87.1 kg) Heparin Dosing Weight: 62 kg  Vital Signs: Temp: 98.5 F (36.9 C) (01/10 0425) Temp Source: Oral (01/10 0425) BP: 134/66 (01/10 0800) Pulse Rate: 79 (01/10 0800)  Labs:  Recent Labs  09/28/16 1605  09/29/16 0031 09/29/16 0546 09/29/16 1157 09/29/16 1937 09/30/16 0537 10/01/16 0350  HGB 10.6*  < >  --  9.8*  --   --  10.5* 10.6*  HCT 33.0*  < >  --  30.4*  --   --  33.5* 32.9*  PLT 234  --   --  221  --   --  213 218  APTT 34  --   --   --   --   --   --   --   LABPROT 17.6*  --   --   --   --   --   --   --   INR 1.43  --   --   --   --   --   --   --   HEPARINUNFRC  --   --   --   --   --  0.50 0.59 0.57  CREATININE 1.47*  < >  --  1.39*  --   --  1.18* 1.31*  TROPONINI  --   --  0.61* 0.62* 0.52*  --   --   --   < > = values in this interval not displayed.  Estimated Creatinine Clearance: 36.6 mL/min (by C-G formula based on SCr of 1.31 mg/dL (H)).   Assessment: 71 yo F with new LLE DVT, started on IV heparin infusion 09/29/16.  CT chest PE 1/8: Neg for pulmonary embolism  Hg stable at 10.6, PLTC WNL. No bleeding reported.  To convert UFH to Xarelto.   Goal of Therapy:  Heparin level 0.3-0.7 units/ml Monitor platelets by anticoagulation protocol: Yes   Plan:  DC UFH Xarelto 15 mg po bid x 21 days then 20 mg po qsupper starting Wed 1/31 at suppertime x 3 months per Dr Oval Linsey note Xarelto DC kit ordered for pt  Eudelia Bunch, Pharm.D. [phone] 10/01/2016 10:41 AM

## 2016-10-02 ENCOUNTER — Inpatient Hospital Stay (HOSPITAL_COMMUNITY): Payer: Medicare Other

## 2016-10-02 LAB — COMPREHENSIVE METABOLIC PANEL
ALBUMIN: 2.4 g/dL — AB (ref 3.5–5.0)
ALK PHOS: 342 U/L — AB (ref 38–126)
ALT: 50 U/L (ref 14–54)
AST: 33 U/L (ref 15–41)
Anion gap: 9 (ref 5–15)
BUN: 13 mg/dL (ref 6–20)
CALCIUM: 8 mg/dL — AB (ref 8.9–10.3)
CHLORIDE: 101 mmol/L (ref 101–111)
CO2: 29 mmol/L (ref 22–32)
CREATININE: 1.38 mg/dL — AB (ref 0.44–1.00)
GFR calc Af Amer: 44 mL/min — ABNORMAL LOW (ref >60)
GFR calc non Af Amer: 38 mL/min — ABNORMAL LOW (ref >60)
GLUCOSE: 116 mg/dL — AB (ref 65–99)
Potassium: 2.9 mmol/L — ABNORMAL LOW (ref 3.5–5.1)
SODIUM: 139 mmol/L (ref 135–145)
Total Bilirubin: 0.8 mg/dL (ref 0.3–1.2)
Total Protein: 6.9 g/dL (ref 6.5–8.1)

## 2016-10-02 LAB — CBC
HCT: 34.9 % — ABNORMAL LOW (ref 36.0–46.0)
HEMOGLOBIN: 11 g/dL — AB (ref 12.0–15.0)
MCH: 25.8 pg — AB (ref 26.0–34.0)
MCHC: 31.5 g/dL (ref 30.0–36.0)
MCV: 81.9 fL (ref 78.0–100.0)
PLATELETS: 262 10*3/uL (ref 150–400)
RBC: 4.26 MIL/uL (ref 3.87–5.11)
RDW: 21.9 % — ABNORMAL HIGH (ref 11.5–15.5)
WBC: 12.9 10*3/uL — AB (ref 4.0–10.5)

## 2016-10-02 MED ORDER — SPIRONOLACTONE 25 MG PO TABS
25.0000 mg | ORAL_TABLET | Freq: Every day | ORAL | Status: DC
  Administered 2016-10-02 – 2016-10-03 (×2): 25 mg via ORAL
  Filled 2016-10-02 (×2): qty 1

## 2016-10-02 MED ORDER — POTASSIUM CHLORIDE 20 MEQ/15ML (10%) PO SOLN
40.0000 meq | Freq: Two times a day (BID) | ORAL | Status: AC
  Administered 2016-10-02 (×2): 40 meq via ORAL
  Filled 2016-10-02 (×2): qty 30

## 2016-10-02 MED ORDER — POLYETHYLENE GLYCOL 3350 17 G PO PACK
17.0000 g | PACK | Freq: Two times a day (BID) | ORAL | Status: DC
  Administered 2016-10-02 – 2016-10-03 (×2): 17 g via ORAL
  Filled 2016-10-02 (×3): qty 1

## 2016-10-02 MED ORDER — FUROSEMIDE 40 MG PO TABS
40.0000 mg | ORAL_TABLET | Freq: Every day | ORAL | Status: DC
  Administered 2016-10-02 – 2016-10-03 (×2): 40 mg via ORAL
  Filled 2016-10-02 (×2): qty 1

## 2016-10-02 MED ORDER — GADOBENATE DIMEGLUMINE 529 MG/ML IV SOLN
15.0000 mL | Freq: Once | INTRAVENOUS | Status: AC
  Administered 2016-10-02: 15 mL via INTRAVENOUS

## 2016-10-02 MED ORDER — CEFPODOXIME PROXETIL 200 MG PO TABS
200.0000 mg | ORAL_TABLET | Freq: Two times a day (BID) | ORAL | Status: DC
  Administered 2016-10-02 – 2016-10-03 (×3): 200 mg via ORAL
  Filled 2016-10-02 (×4): qty 1

## 2016-10-02 NOTE — Progress Notes (Signed)
PROGRESS NOTE    Tara [name]  [id number] DOB: 02/01/1946 DOA: 09/28/2016 PCP: [name], MD    Brief Narrative:  71 year old with medical history significant for remote breast cancer, nonischemic cardiomyopathy, GERD who presented complaining of generalized weakness also complaining of shortness of breath, found to have HCAP and CHF Further evaluation revealed DVT of left lower extremity  Assessment & Plan:   HCAP (healthcare-associated pneumonia) - improving on IV cefepime and vancomycin - CTA chest with infiltrates - Stop Vancomycin 1/10, blood cx negative - transition to PO Vantin today  ACute on chronic systolic CHF -improving -ECHO with drop in EF further to 20-25% now -on IV lasix, cards following -I/O not recorded -plan for outpatient stress test when more stable  Elevated troponin -likely due to demand in setting of CHF/HCAP, clinically no ACS -ECHO with diffuse hypokinesis EF now 20-25% -cards following, outpatient stress testing recommended  Acute LLE DVT -was on IV heparin changed to xarelto now -stable   Elevated alkaline phosphatase level -etiology unclear, s/p cholecystectomy -GGT elevated too, hence hepatobiliary in origin -since trending up compared to baseline will check MRCP -hypodensities noted in liver on last CT in 9/17 suspected to be cysts  Meningioma -sphenoidal -needs Follow up, monitoring    Prediabetes - Blood sugars elevated. We'll place on diabetic diet    Hypokalemia - replaced, mag ok    Chronic kidney disease (CKD), stage III (moderate)    H/o Breast cancer  -s/p chemo/XRT and mastectomy -FU with oncology  DVT prophylaxis: xarelto Code Status: Full Family Communication: none at bedside Disposition Plan: SNF in ?2days   Consultants:   None   Procedures:    Antimicrobials: Cefepime and vancomycin 1/7-1/10  Vantin 1/11-   Subjective: Breathing better, no BM in few days  Objective: Vitals:   10/01/16 0800 10/01/16 1423 10/01/16 2032 10/02/16 0458  BP: 134/66 (!) 111/52 121/73 122/60  Pulse: 79 74 77 72  Resp:  17 18 18   Temp:  98.8 F (37.1 C) 97.9 F (36.6 C) 98.4 F (36.9 C)  TempSrc:  Oral Oral Oral  SpO2:  98% 100% 99%  Weight:        Intake/Output Summary (Last 24 hours) at 10/02/16 1056 Last data filed at 10/02/16 0917  Gross per 24 hour  Intake                0 ml  Output                0 ml  Net                0 ml   Filed Weights   09/28/16 2101  Weight: 87.1 kg (192 lb 1.6 oz)    Examination:  General exam: AAOx3, no distress Respiratory system: few basilar crcakles Cardiovascular system: S1 & S2 heard, RRR. No JVD, murmurs,  Gastrointestinal system: Abdomen is nondistended, soft and nontender.  Central nervous system: Alert and oriented. No focal neurological deficits. Extremities: Symmetric 5 x 5 power. No facial asymmetry, 1plus edema Skin: No rashes, lesions or ulcers, on limited exam Psychiatry: Judgement and insight appear normal. Mood & affect appropriate.   Data Reviewed: I have personally reviewed following labs and imaging studies  CBC:  Recent Labs Lab 09/28/16 1605 09/28/16 1624 09/29/16 0546 09/30/16 0537 10/01/16 0350 10/02/16 0507  WBC 9.0  --  10.7* 11.0* 12.7* 12.9*  NEUTROABS 4.7  --  5.0  --   --   --   HGB  10.6* 12.6 9.8* 10.5* 10.6* 11.0*  HCT 33.0* 37.0 30.4* 33.5* 32.9* 34.9*  MCV 81.7  --  81.9 82.5 81.6 81.9  PLT 234  --  221 213 218 [id number]   Basic Metabolic Panel:  Recent Labs Lab 09/28/16 1605 09/28/16 1624 09/29/16 0546 09/30/16 0537 10/01/16 0350 10/02/16 0507  NA 142 147* 145 141 140 139  K 2.6* 2.6* 3.1* 3.0* 2.5* 2.9*  CL 109 106 115* 111 103 101  CO2 23  --  24 21* 29 29  GLUCOSE 135* 138* 93 87 92 116*  BUN 20 21* 18 13 11 13   CREATININE 1.47* 1.30* 1.39* 1.18* 1.31* 1.38*  CALCIUM 8.3*  --  7.9* 8.3* 8.1* 8.0*  MG 2.1  --   --   --   --   --    GFR: Estimated Creatinine Clearance: 34.7  mL/min (by C-G formula based on SCr of 1.38 mg/dL (H)). Liver Function Tests:  Recent Labs Lab 09/28/16 1605 09/29/16 0546 09/30/16 0537 10/02/16 0507  AST 75* 60* 78* 33  ALT 90* 74* 84* 50  ALKPHOS 425* 352* 493* 342*  BILITOT 1.1 0.5 1.1 0.8  PROT 7.3 6.9 7.0 6.9  ALBUMIN 3.1* 2.7* 2.7* 2.4*   No results for input(s): LIPASE, AMYLASE in the last 168 hours. No results for input(s): AMMONIA in the last 168 hours. Coagulation Profile:  Recent Labs Lab 09/28/16 1605  INR 1.43   Cardiac Enzymes:  Recent Labs Lab 09/29/16 0031 09/29/16 0546 09/29/16 1157  TROPONINI 0.61* 0.62* 0.52*   BNP (last 3 results) No results for input(s): PROBNP in the last 8760 hours. HbA1C: No results for input(s): HGBA1C in the last 72 hours. CBG: No results for input(s): GLUCAP in the last 168 hours. Lipid Profile: No results for input(s): CHOL, HDL, LDLCALC, TRIG, CHOLHDL, LDLDIRECT in the last 72 hours. Thyroid Function Tests: No results for input(s): TSH, T4TOTAL, FREET4, T3FREE, THYROIDAB in the last 72 hours. Anemia Panel: No results for input(s): VITAMINB12, FOLATE, FERRITIN, TIBC, IRON, RETICCTPCT in the last 72 hours. Sepsis Labs: No results for input(s): PROCALCITON, LATICACIDVEN in the last 168 hours.  Recent Results (from the past 240 hour(s))  Culture, blood (routine x 2) Call MD if unable to obtain prior to antibiotics being given     Status: None (Preliminary result)   Collection Time: 09/28/16 10:00 PM  Result Value Ref Range Status   Specimen Description BLOOD LEFT ANTECUBITAL  Final   Special Requests BOTTLES DRAWN AEROBIC AND ANAEROBIC 10CC EA  Final   Culture NO GROWTH 3 DAYS  Final   Report Status PENDING  Incomplete  Culture, blood (routine x 2) Call MD if unable to obtain prior to antibiotics being given     Status: None (Preliminary result)   Collection Time: 09/28/16 10:07 PM  Result Value Ref Range Status   Specimen Description BLOOD LEFT HAND  Final    Special Requests IN PEDIATRIC BOTTLE 3CC  Final   Culture NO GROWTH 3 DAYS  Final   Report Status PENDING  Incomplete         Radiology Studies: No results found. Scheduled Meds: . cefpodoxime  200 mg Oral Q12H  . clonazePAM  1 mg Oral QHS  . docusate sodium  100 mg Oral BID  . fluticasone  2 spray Each Nare Daily  . furosemide  40 mg Oral Daily  . metoCLOPramide  5 mg Oral BID  . metoprolol tartrate  25 mg Oral BID  . pantoprazole  40 mg Oral BID  . polyethylene glycol  17 g Oral BID  . potassium chloride  40 mEq Oral BID  . pravastatin  40 mg Oral q1800  . Rivaroxaban  15 mg Oral BID WC  . [START ON 10/22/2016] Rivaroxaban  20 mg Oral Q supper  . sacubitril-valsartan  1 tablet Oral BID  . spironolactone  25 mg Oral Daily   Continuous Infusions:    LOS: 4 days   Time spent: > 35 minutes  Domenic Polite, MD [facility] Pager [phone]  If 7PM-7AM, please contact night-coverage [email or web address] Password [facility] 10/02/2016, 10:56 AM

## 2016-10-02 NOTE — Progress Notes (Signed)
Patient Name: Tara [name] Date of Encounter: 10/02/2016  Primary Cardiologist: Dr. Kearney [name] Problem List     Principal Problem:   HCAP (healthcare-associated pneumonia) Active Problems:   Nonischemic cardiomyopathy ([place])   Prediabetes   Leg edema, left   Acute respiratory alkalosis   Hypokalemia   Prolonged Q-T interval on ECG   Chronic kidney disease (CKD), stage III (moderate)   Elevated alkaline phosphatase level   Elevated LFTs   Elevated troponin   Elevated brain natriuretic peptide (BNP) level   Anemia   DVT (deep venous thrombosis) (HCC)   Acute on chronic combined systolic and diastolic heart failure (HCC)   Demand ischemia (HCC)     Subjective   Breathing continues to improve.  She is feeling much better.   Inpatient Medications    Scheduled Meds: . ceFEPime (MAXIPIME) IV  1 g Intravenous Q12H  . clonazePAM  1 mg Oral QHS  . docusate sodium  100 mg Oral BID  . fluticasone  2 spray Each Nare Daily  . furosemide  40 mg Oral Daily  . metoCLOPramide  5 mg Oral BID  . metoprolol tartrate  25 mg Oral BID  . pantoprazole  40 mg Oral BID  . polyethylene glycol  17 g Oral BID  . potassium chloride  40 mEq Oral BID  . pravastatin  40 mg Oral q1800  . Rivaroxaban  15 mg Oral BID WC  . [START ON 10/22/2016] Rivaroxaban  20 mg Oral Q supper  . sacubitril-valsartan  1 tablet Oral BID   Continuous Infusions:  PRN Meds: zolpidem   Vital Signs    Vitals:   10/01/16 0800 10/01/16 1423 10/01/16 2032 10/02/16 0458  BP: 134/66 (!) 111/52 121/73 122/60  Pulse: 79 74 77 72  Resp:  17 18 18   Temp:  98.8 F (37.1 C) 97.9 F (36.6 C) 98.4 F (36.9 C)  TempSrc:  Oral Oral Oral  SpO2:  98% 100% 99%  Weight:        Intake/Output Summary (Last 24 hours) at 10/02/16 0931 Last data filed at 10/02/16 [id number]  Gross per 24 hour  Intake                0 ml  Output                0 ml  Net                0 ml   Filed Weights   09/28/16 2101  Weight: 87.1  kg (192 lb 1.6 oz)    Physical Exam    GEN: Well nourished, well developed, in no acute distress.  HEENT: Grossly normal.  Neck: Supple, no JVD, carotid bruits, or masses. Cardiac: RRR.  III/VI systolic murmur at LLSB.  No rubs, or gallops.  Extremities: Trace edema bilaterally.   Radials/DP/PT 2+ and equal bilaterally.  No clubbing, cyanosis Respiratory:  Respirations regular and mildly labored.  Faint diffuse expiratory wheezes and bibasilar crackles.  GI: Soft, nontender, nondistended, BS + x 4. MS: no deformity or atrophy. Skin: warm and dry, no rash. Neuro:  Strength and sensation are intact. Psych: AAOx3.  Normal affect.  Labs    CBC  Recent Labs  10/01/16 0350 10/02/16 0507  WBC 12.7* 12.9*  HGB 10.6* 11.0*  HCT 32.9* 34.9*  MCV 81.6 81.9  PLT 218 [id number]   Basic Metabolic Panel  Recent Labs  10/01/16 0350 10/02/16 0507  NA 140 139  K  2.5* 2.9*  CL 103 101  CO2 29 29  GLUCOSE 92 116*  BUN 11 13  CREATININE 1.31* 1.38*  CALCIUM 8.1* 8.0*   Liver Function Tests  Recent Labs  09/30/16 0537 10/02/16 0507  AST 78* 33  ALT 84* 50  ALKPHOS 493* 342*  BILITOT 1.1 0.8  PROT 7.0 6.9  ALBUMIN 2.7* 2.4*   No results for input(s): LIPASE, AMYLASE in the last 72 hours. Cardiac Enzymes  Recent Labs  09/29/16 1157  TROPONINI 0.52*   BNP Invalid input(s): POCBNP D-Dimer No results for input(s): DDIMER in the last 72 hours. Hemoglobin A1C No results for input(s): HGBA1C in the last 72 hours. Fasting Lipid Panel No results for input(s): CHOL, HDL, LDLCALC, TRIG, CHOLHDL, LDLDIRECT in the last 72 hours. Thyroid Function Tests No results for input(s): TSH, T4TOTAL, T3FREE, THYROIDAB in the last 72 hours.  Invalid input(s): FREET3  Telemetry    n/a  ECG    10/01/16: Sinus rhythm rate 78 bpm.  LAD.  LVH.  - Personally Reviewed  Radiology    No results found.  Cardiac Studies   Echo 03/21/16: Study Conclusions  - Left ventricle: The cavity  size was mildly dilated. Wall   thickness was normal. Systolic function was moderately reduced.   The estimated ejection fraction was in the range of 35% to 40%.   The study is not technically sufficient to allow evaluation of LV   diastolic function. - Aortic valve: Valve area (VTI): 1.99 cm^2. Valve area (Vmax):   2.19 cm^2. Valve area (Vmean): 2.23 cm^2. - Mitral valve: There was mild regurgitation. - Left atrium: The atrium was mildly to moderately dilated. - Pulmonary arteries: PA peak pressure: 49 mm Hg (S).  Echo 10/01/16: Study Conclusions  - Left ventricle: The cavity size was normal. Systolic function was   severely reduced. The estimated ejection fraction was in the   range of 20% to 25%. Diffuse hypokinesis. Features are consistent   with a pseudonormal left ventricular filling pattern, with   concomitant abnormal relaxation and increased filling pressure   (grade 2 diastolic dysfunction). - Ventricular septum: The contour showed diastolic flattening. - Mitral valve: There was moderate regurgitation. - Left atrium: The atrium was mildly dilated. - Tricuspid valve: There was severe regurgitation. - Pulmonary arteries: Systolic pressure was mildly increased. PA   peak pressure: 35 mm Hg (S).  Impressions:  - Compared to the prior study, there has been no significant   interval change.  Patient Profile     [name] is a 2F with NICM LVEF 35-40%, hypertensive heart disease, hyperlipidemia, SVT and breast cancer s/p chemo/XRT and mastectomy here with pneumonia who was noted to have a DVT and acute on chronic systolic and diastolic heart failure  Assessment & Plan    # Acute on chronic systolic and diastolic heart failure:   # Hypertensive heart disease:   [name] had an echo had an echo yesterday that revealed a reduction in her LVEF from  35-40% 02/2016 to 20-25%.  Her volume status has improved significantly.  In/outs were not recorded yesterday for unclear  reasons.  We will switch lasix to 40mg  po daily.  Continue metoprolol and Entresto.  We will add spironolactone, which will also help with her hypokalemia.   # Acute DVT: Continue Xarelto 15mg  bid x21 days followed by 20 mg daily for a total of 3 months.   # Demand ischemia: Troponin mildly elevated in a pattern consistent with demand ischemia. She  denies chest pain.  We will plan for outpatient stress testing when stable.  # Hyperlipidemia:  Continue pravastatin.   Signed, Skeet Latch, MD  10/02/2016, 9:31 AM

## 2016-10-02 NOTE — Care Management Important Message (Signed)
Important Message  Patient Details  Name: Tara [name] MRN: [id number] Date of Birth: Aug 03, 1946   Medicare Important Message Given:  Yes    Orbie Pyo 10/02/2016, 12:41 PM

## 2016-10-03 DIAGNOSIS — I82402 Acute embolism and thrombosis of unspecified deep veins of left lower extremity: Secondary | ICD-10-CM

## 2016-10-03 DIAGNOSIS — I428 Other cardiomyopathies: Secondary | ICD-10-CM

## 2016-10-03 LAB — COMPREHENSIVE METABOLIC PANEL
ALK PHOS: 277 U/L — AB (ref 38–126)
ALT: 41 U/L (ref 14–54)
ANION GAP: 11 (ref 5–15)
AST: 35 U/L (ref 15–41)
Albumin: 2.4 g/dL — ABNORMAL LOW (ref 3.5–5.0)
BILIRUBIN TOTAL: 1 mg/dL (ref 0.3–1.2)
BUN: 13 mg/dL (ref 6–20)
CALCIUM: 8 mg/dL — AB (ref 8.9–10.3)
CO2: 24 mmol/L (ref 22–32)
Chloride: 102 mmol/L (ref 101–111)
Creatinine, Ser: 1.23 mg/dL — ABNORMAL HIGH (ref 0.44–1.00)
GFR, EST AFRICAN AMERICAN: 50 mL/min — AB (ref >60)
GFR, EST NON AFRICAN AMERICAN: 43 mL/min — AB (ref >60)
GLUCOSE: 108 mg/dL — AB (ref 65–99)
POTASSIUM: 4.5 mmol/L (ref 3.5–5.1)
Sodium: 137 mmol/L (ref 135–145)
TOTAL PROTEIN: 6.6 g/dL (ref 6.5–8.1)

## 2016-10-03 LAB — CBC
HEMATOCRIT: 36.9 % (ref 36.0–46.0)
HEMOGLOBIN: 11.6 g/dL — AB (ref 12.0–15.0)
MCH: 26 pg (ref 26.0–34.0)
MCHC: 31.4 g/dL (ref 30.0–36.0)
MCV: 82.6 fL (ref 78.0–100.0)
Platelets: 286 10*3/uL (ref 150–400)
RBC: 4.47 MIL/uL (ref 3.87–5.11)
RDW: 21.8 % — ABNORMAL HIGH (ref 11.5–15.5)
WBC: 13 10*3/uL — ABNORMAL HIGH (ref 4.0–10.5)

## 2016-10-03 MED ORDER — SACUBITRIL-VALSARTAN 24-26 MG PO TABS: 1.0000 | ORAL_TABLET | Freq: Two times a day (BID) | ORAL | 0 refills | Status: DC

## 2016-10-03 MED ORDER — CEFPODOXIME PROXETIL 200 MG PO TABS: 200.0000 mg | ORAL_TABLET | Freq: Two times a day (BID) | ORAL | 0 refills | Status: DC

## 2016-10-03 MED ORDER — FUROSEMIDE 40 MG PO TABS: 40.0000 mg | ORAL_TABLET | Freq: Two times a day (BID) | ORAL | 0 refills | Status: DC

## 2016-10-03 MED ORDER — METOPROLOL SUCCINATE ER 50 MG PO TB24: 50.0000 mg | ORAL_TABLET | Freq: Every day | ORAL | 0 refills | Status: DC

## 2016-10-03 MED ORDER — RIVAROXABAN 20 MG PO TABS: 20.0000 mg | ORAL_TABLET | Freq: Every day | ORAL | 0 refills | Status: DC

## 2016-10-03 MED ORDER — RIVAROXABAN 15 MG PO TABS: 15.0000 mg | ORAL_TABLET | Freq: Two times a day (BID) | ORAL | 0 refills | Status: DC

## 2016-10-03 MED ORDER — SPIRONOLACTONE 25 MG PO TABS: 25.0000 mg | ORAL_TABLET | Freq: Every day | ORAL | 0 refills | Status: DC

## 2016-10-03 MED ORDER — FUROSEMIDE 40 MG PO TABS: 40.0000 mg | ORAL_TABLET | Freq: Two times a day (BID) | ORAL | Status: DC

## 2016-10-03 MED ORDER — METOPROLOL SUCCINATE ER 50 MG PO TB24: 50.0000 mg | ORAL_TABLET | Freq: Every day | ORAL | Status: DC

## 2016-10-03 NOTE — Progress Notes (Signed)
Occupational Therapy Treatment Patient Details Name: Tara [name] MRN: [id number] DOB: 09-Jun-1946 Today's Date: 10/03/2016    History of present illness 71 year old with medical history significant for remote breast cancer, nonischemic cardiomyopathy, GERD who presented complaining of generalized weakness also complaining of shortness of breath.  Treated with LAsix for volume overload.  Also positive for Left LE DVT.     OT comments  Pt making progress towards OT goals this session. Session focused on general theraband exercise program for strengthening BUE. Also focused on fall prevention (handout provided) and energy conservation education (handout provided). See performance level below. Pt assisted to bathroom for toilet needs during session with husband acting as caregiver since the plan is for him to take her home. Pt will benefit from skilled OT as home health follow up to continue education and maximize safety and independence in ADL.   Follow Up Recommendations  Home health OT;Supervision/Assistance - 24 hour    Equipment Recommendations  None recommended by OT;Other (comment) (spoke with family about shower chair & 3 in 1, they declined)    Recommendations for Other Services      Precautions / Restrictions Precautions Precautions: Fall Restrictions Weight Bearing Restrictions: No       Mobility Bed Mobility Overal bed mobility: Needs Assistance Bed Mobility: Sit to Supine;Supine to Sit     Supine to sit: Min guard Sit to supine: Min guard   General bed mobility comments: Pt's husband acting as caregiver, and providing assist.   Transfers Overall transfer level: Needs assistance Equipment used: None Transfers: Sit to/from Stand Sit to Stand: Min guard         General transfer comment: No physical assist required. Min guard for safety; husband acting as caregiver    Balance Overall balance assessment: Needs assistance;History of Falls Sitting-balance  support: No upper extremity supported;Feet supported Sitting balance-Leahy Scale: Fair Sitting balance - Comments: sitting EOB With no back support   Standing balance support: No upper extremity supported;During functional activity Standing balance-Leahy Scale: Fair Standing balance comment: no RW used this session                   ADL Overall ADL's : Needs assistance/impaired Eating/Feeding: Modified independent;Sitting Eating/Feeding Details (indicate cue type and reason): Pt eating lunch in bed when OT arrived  Grooming: Wash/dry hands;Supervision/safety;Standing Grooming Details (indicate cue type and reason): sink level                 Toilet Transfer: Supervision/safety;Comfort height toilet;Grab bars Toilet Transfer Details (indicate cue type and reason): supervision for safety Toileting- Clothing Manipulation and Hygiene: Supervision/safety;Sit to/from stand Toileting - Clothing Manipulation Details (indicate cue type and reason): managed hospital gown and peri care Tub/ Shower Transfer: Tub transfer;Min guard;With caregiver independent assisting;Ambulation Tub/Shower Transfer Details (indicate cue type and reason): Educated Pt on how to enter tub safety using wall as support, and talked to family about the usefulness of a shower chair for safety and energy conservation Functional mobility during ADLs: Min guard (talked about using a RW/rollator in the community for safety) General ADL Comments: Pt and husband encouraged to use a RW/rollator in community as son states the Pt has a history of falls. Stressed this is much safer and will conserve energy. Energy conservation/fall prevention in the home handout also provided and reviewed with Pt, husband, and son.      Vision  Perception     Praxis      Cognition   Behavior During Therapy: WFL for tasks assessed/performed Overall Cognitive Status: Within Functional Limits for tasks  assessed                       Extremity/Trunk Assessment               Exercises General Exercises - Upper Extremity Shoulder ABduction: AROM;Right;Left;Strengthening;10 reps;Seated;Theraband (orange theraband) Theraband Level (Shoulder Abduction): Other (comment) (orange (low resistance)) Shoulder ADduction: AROM;Strengthening;Right;Left;10 reps;Seated Shoulder Horizontal ABduction: AROM;Strengthening;Left;Right;10 reps;Seated;Theraband Theraband Level (Shoulder Horizontal Abduction): Other (comment) (orange) Shoulder Horizontal ADduction: AROM;Strengthening;Right;Left;10 reps;Seated;Theraband Theraband Level (Shoulder Horizontal Adduction): Other (comment) (orange)   Shoulder Instructions       General Comments      Pertinent Vitals/ Pain       Pain Assessment: No/denies pain  Home Living                                          Prior Functioning/Environment              Frequency  Min 2X/week        Progress Toward Goals  OT Goals(current goals can now be found in the care plan section)  Progress towards OT goals: Progressing toward goals  Acute Rehab OT Goals Patient Stated Goal: to go home OT Goal Formulation: With patient Time For Goal Achievement: 10/15/16 Potential to Achieve Goals: Good  Plan Discharge plan needs to be updated    Co-evaluation                 End of Session Equipment Utilized During Treatment: Gait belt   Activity Tolerance Patient limited by fatigue   Patient Left in bed;with call bell/phone within reach;with family/visitor present   Nurse Communication Mobility status        Time: [id number] OT Time Calculation (min): 35 min  Charges: OT General Charges $OT Visit: 1 Procedure OT Evaluation $OT Eval Low Complexity: 1 Procedure OT Treatments $Self Care/Home Management : 8-22 mins  Merri Ray Rosland Riding 10/03/2016, 2:57 PM Hulda Humphrey OTR/L [phone]

## 2016-10-03 NOTE — Progress Notes (Signed)
Husband, son, and pt verbally understood DC instructions, no questions asked. Pt was DC with rolling walker.

## 2016-10-03 NOTE — Discharge Summary (Signed)
Physician Discharge Summary  Tara [name] [id number] DOB: [birth year] DOA: 09/28/2016  PCP: [name], MD  Admit date: 09/28/2016 Discharge date: 10/03/2016  Time spent: 35 minutes  Recommendations for Outpatient Follow-up:  1. PCP Dr.Simpson in 1 week, started on Xarelto for ACute DVT this admission 2. Cardiology Dr.Allred in 2 weeks, Outpatient stress test recommended and &day event monitor at discharge-started on ENtresto this admission, lasix dose to be changed to 40mg  daily after 1 week, please check Bmet in 1 week 3. Meningioma noted on CT head needs Follow Up   Discharge Diagnoses:    Acute on chronic systolic CHF   HCAP (healthcare-associated pneumonia)     Prediabetes   Leg edema, left   Acute respiratory alkalosis   Hypokalemia   Prolonged Q-T interval on ECG   Chronic kidney disease (CKD), stage III (moderate)   Elevated alkaline phosphatase level   Elevated LFTs   Elevated troponin   Anemia   DVT (deep venous thrombosis) (HCC)   Acute on chronic combined systolic and diastolic heart failure ([place])   Demand ischemia [facility])   Discharge Condition: improved  Diet recommendation: low sodium heart healthy   Filed Weights   09/28/16 2101  Weight: 87.1 kg (192 lb 1.6 oz)    History of present illness:  71 year old with medical history significant for remote breast cancer, nonischemic cardiomyopathy, GERD who presented complaining of generalized weakness also complaining of shortness of breath, found to have HCAP and CHF  Hospital Course:   ACute on chronic systolic CHF -cards consulted, improving, diuresed with IV lasix and then started on entresto and Aldactone -ECHO with drop in EF further to 20-25% now -discharged home on Po lasix 40mg  BID for 1 week followed by 40mg  daily along with Entresto and Aldactone -plan for outpatient stress test when more stable -needs FU Bmet in 1 week  HCAP (healthcare-associated pneumonia) - CTA chest was notable for  infiltrates - blood cx negative - improving, treated with IV cefepime and vancomycin initially and then transitioned to PO Vantin  Elevated troponin -mild, likely due to demand in setting of CHF/HCAP, clinically no ACS -ECHO with diffuse hypokinesis EF now 20-25% -followed by cardiology, outpatient stress testing recommended  Acute LLE DVT -detected on admission, started on IV heparin changed to xarelto now -stable on xarelto, needs this for atleast 3-71months   Elevated alkaline phosphatase level -etiology unclear, s/p cholecystectomy -GGT elevated too, hence hepatobiliary in origin -since trending up compared to baseline, we did an MRCP which was normal -no worrisome liver lesions noted and pancreas was normal too  Meningioma -sphenoidal -needs Follow up, monitoring    Prediabetes - Blood sugars elevated. We'll place on diabetic diet    Hypokalemia - replaced, mag ok    Chronic kidney disease (CKD), stage III (moderate) -stable, creatinine in 1-2-1.3 range    H/o Breast cancer  -s/p chemo/XRT and mastectomy -FU with oncology  Consultations:  Cardiology Dr.[place]  Discharge Exam: Vitals:   10/02/16 2113 10/03/16 0510  BP: 120/82 (!) 112/59  Pulse: 78 75  Resp: 18 17  Temp: 98.4 F (36.9 C) 98.6 F (37 C)    General: AAOx3 Cardiovascular: S1S2/RRR Respiratory: CTAB  Discharge Instructions    Current Discharge Medication List    START taking these medications   Details  cefpodoxime (VANTIN) 200 MG tablet Take 1 tablet (200 mg total) by mouth every 12 (twelve) hours. For 2days Qty: 4 tablet, Refills: 0    furosemide (LASIX) 40 MG tablet Take  1 tablet (40 mg total) by mouth 2 (two) times daily. For 1 week then CHANGE to 40mg  daily Qty: 30 tablet, Refills: 0    metoprolol succinate (TOPROL-XL) 50 MG 24 hr tablet Take 1 tablet (50 mg total) by mouth daily. Take with or immediately following a meal. Qty: 30 tablet, Refills: 0    !!  Rivaroxaban (XARELTO) 15 MG TABS tablet Take 1 tablet (15 mg total) by mouth 2 (two) times daily with a meal. Till 1/30 then switch to 20mg  daily Qty: 40 tablet, Refills: 0    !! rivaroxaban (XARELTO) 20 MG TABS tablet Take 1 tablet (20 mg total) by mouth daily with supper. Starting on 1/31 Qty: 30 tablet, Refills: 0    sacubitril-valsartan (ENTRESTO) 24-26 MG Take 1 tablet by mouth 2 (two) times daily. Qty: 60 tablet, Refills: 0    spironolactone (ALDACTONE) 25 MG tablet Take 1 tablet (25 mg total) by mouth daily. Qty: 30 tablet, Refills: 0     !! - Potential duplicate medications found. Please discuss with provider.    CONTINUE these medications which have NOT CHANGED   Details  azelastine (ASTELIN) 0.1 % nasal spray Place 2 sprays into both nostrils 2 (two) times daily. Use in each nostril as directed Qty: 30 mL, Refills: 12   Associated Diagnoses: Seasonal allergies    clonazePAM (KLONOPIN) 0.5 MG tablet TAKE (1) TABLET BY MOUTH AT BEDTIME. Qty: 90 tablet, Refills: 0    docusate sodium (COLACE) 100 MG capsule Take 100 mg by mouth 2 (two) times daily.    fexofenadine (ALLEGRA) 180 MG tablet Take 180 mg by mouth daily as needed for allergies.     lovastatin (MEVACOR) 40 MG tablet Take 1 tablet (40 mg total) by mouth at bedtime. Qty: 90 tablet, Refills: 1    pantoprazole (PROTONIX) 40 MG tablet Take 1 tablet (40 mg total) by mouth 2 (two) times daily. Qty: 180 tablet, Refills: 0    sertraline (ZOLOFT) 50 MG tablet Take 1 tablet (50 mg total) by mouth daily. Qty: 90 tablet, Refills: 1    zolpidem (AMBIEN) 10 MG tablet Take 1 tablet (10 mg total) by mouth at bedtime as needed for sleep. Qty: 90 tablet, Refills: 1    fluticasone (FLONASE) 50 MCG/ACT nasal spray Place 2 sprays into both nostrils daily. Qty: 16 g, Refills: 2      STOP taking these medications     carvedilol (COREG) 6.25 MG tablet        Allergies  Allergen Reactions  . Aspirin Other (See Comments)     Reports GI bleed with daily use  . Lisinopril Cough  . Sudafed [Pseudoephedrine Hcl]     Pt reports she had drainage in throat that made her throat hurt.       The results of significant diagnostics from this hospitalization (including imaging, microbiology, ancillary and laboratory) are listed below for reference.    Significant Diagnostic Studies: Dg Chest 2 View  Result Date: 09/28/2016 CLINICAL DATA:  71 year old with acute onset of shortness of breath and generalized weakness that began yesterday, now associated with slurred speech. Personal history of right breast cancer post lumpectomy in 2008. EXAM: CHEST  2 VIEW COMPARISON:  08/25/2016, 06/22/2016 and earlier. FINDINGS: AP semi-erect and lateral images were obtained. Cardiac silhouette mildly to moderately enlarged, unchanged when allowing for differences in technique. New focal opacity in the superior segment right lower lobe since the examination 1 month ago. Lungs otherwise clear. No pleural effusions. Normal pulmonary vascularity.  Calcified right paratracheal and right hilar lymph nodes again noted. Visualized bony thorax intact. IMPRESSION: 1. Acute pneumonia suspected involving the superior segment of the right lower lobe. 2. Stable cardiomegaly without pulmonary edema. 3. Calcified right paratracheal and right hilar lymph nodes indicating old granulomatous disease. Electronically Signed   By: Evangeline Dakin M.D.   On: 09/28/2016 18:17   Ct Head Wo Contrast  Result Date: 09/28/2016 CLINICAL DATA:  Slurred speech. History of meningioma of the planum sphenoidale region. EXAM: CT HEAD WITHOUT CONTRAST TECHNIQUE: Contiguous axial images were obtained from the base of the skull through the vertex without intravenous contrast. COMPARISON:  Brain MRI March 05, 2016 FINDINGS: Brain: The ventricles are normal in size and configuration. The previously noted meningioma in the planum sphenoidale region is again noted, less than optimally seen on  noncontrast enhanced study. It currently measures 2.9 x 2.3 x 1.8 cm, slightly larger than on prior study. There is no surrounding edema, and this meningioma does not cause significant mass effect. There is no other mass evident. There is no hemorrhage, extra-axial fluid collection, or midline shift. Mild basal ganglia calcification bilaterally is felt to be physiologic in this age group. No evident acute infarct. Vascular: There is no vascular calcification evident. No hyperdense vessel. Skull: The bony calvarium appears intact. Sinuses/Orbits: Visualized paranasal sinuses are clear except for modest mucosal thickening in the right sphenoid sinus region. Frontal sinuses are hypoplastic. Visualized orbits appear symmetric bilaterally. Other: Mastoid air cells are clear. There is debris in both external auditory canals. IMPRESSION: Planum sphenoidale region meningioma again noted, modestly increased in size from approximately 10 years prior. No surrounding edema. No new mass evident. No hemorrhage, extra-axial fluid collection, or midline shift. No evidence suggesting acute infarct. Debris in each external auditory canal, likely cerumen. Minimal right sphenoid sinus disease. Electronically Signed   By: Lowella Grip III M.D.   On: 09/28/2016 17:34   Ct Angio Chest Pe W Or Wo Contrast  Result Date: 09/29/2016 CLINICAL DATA:  Shortness of breath. EXAM: CT ANGIOGRAPHY CHEST WITH CONTRAST TECHNIQUE: Multidetector CT imaging of the chest was performed using the standard protocol during bolus administration of intravenous contrast. Multiplanar CT image reconstructions and MIPs were obtained to evaluate the vascular anatomy. CONTRAST:  80 cc Isovue 370 COMPARISON:  None. FINDINGS: Examination is limited by obesity and by breathing motion artifact. Chest wall: No breast masses, supraclavicular or axillary lymphadenopathy. Cardiovascular: The heart is enlarged. No pericardial effusion. There is some contrast in the  left atrium and faint contrast on left ventricle. I do not see any contrast in the aorta worn the pulmonary veins. Patient may have had a test bolus. Significant reflux down the IVC and into the hepatic veins. There is also reflux back into the azygos vein. The pulmonary arterial tree is fairly well opacified. No filling defects to suggest pulmonary embolism. Mediastinum/Nodes: No mediastinal or hilar mass or lymphadenopathy. Scattered lymph nodes are noted. Diffuse esophageal wall thickening could be due to esophagitis or collagen vascular disease. Lungs/Pleura: Limited examination due to breathing motion artifact. A calcified granuloma is noted in the right lower lobe. Patchy peripheral infiltrate in the right lower lobe corresponding with the recent chest x-ray. Is also peribronchial thickening and patchy airspace opacity in the left lower lobe. Date small right pleural effusion is noted. No left pleural effusion. Upper Abdomen: No significant upper abdominal findings. Musculoskeletal: No significant bony findings. Review of the MIP images confirms the above findings. IMPRESSION: 1. No CT findings  for pulmonary embolism. 2. Normal caliber thoracic aorta. 3. Significant reflux of contrast down the IVC likely due to tricuspid regurgitation. 4. Diffusely thickened esophageal wall could be due to esophagitis or collagen vascular disease. Recommend correlation with recent endoscopy. 5. Bibasilar infiltrates and right pleural effusion. Electronically Signed   By: Marijo Sanes M.D.   On: 09/29/2016 18:19   Mr 3d Recon At Scanner  Result Date: 10/02/2016 CLINICAL DATA:  Generalized weakness and shortness of breath. Reflux. Pneumonia. Chronic kidney disease. Elevated liver function tests. Heart failure. EXAM: MRI ABDOMEN WITHOUT AND WITH CONTRAST (INCLUDING MRCP) TECHNIQUE: Multiplanar multisequence MR imaging of the abdomen was performed both before and after the administration of intravenous contrast. Heavily  T2-weighted images of the biliary and pancreatic ducts were obtained, and three-dimensional MRCP images were rendered by post processing. CONTRAST:  75mL MULTIHANCE GADOBENATE DIMEGLUMINE 529 MG/ML IV SOLN COMPARISON:  06/18/2016 FINDINGS: Despite efforts by the technologist and patient, motion artifact is present on today's exam and could not be eliminated. This reduces exam sensitivity and specificity. Lower chest: Right pleural effusion with 1.9 cm opacity peripherally in the right lower lobe, favor pneumonia over malignancy. Moderate cardiomegaly. Circumferential wall thickening of the distal esophagus. Hepatobiliary: Several tiny hepatic cysts including a 4 mm cyst peripherally in the right hepatic lobe on image 53/2103. Common bile duct up to 9 mm. Cholecystectomy. No focal liver lesion. Mild periportal edema. MRCP images are limited by considerable motion artifact, no obvious filling defect in the biliary tree. Pancreas:  Trace fluid in the lesser sac.  Pancreas unremarkable. Spleen:  Unremarkable Adrenals/Urinary Tract: Mild scarring in the left kidney upper pole. Bosniak category 1 cyst of the right kidney upper pole. Adrenal glands normal. Perirenal stranding bilaterally, likely chronic. Stomach/Bowel: Suspected mild wall thickening in the stomach, notably in the gastric antrum. Questionable duodenal wall thickening. No ulcer is identified. Vascular/Lymphatic:  Unremarkable Other: Bilateral flank edema. On the right side this extends along the serratus anterior muscle. Musculoskeletal: Unremarkable IMPRESSION: 1. Mild periportal edema, nonspecific. No worrisome focal liver lesion. 2. Extrahepatic biliary tree measures 9 mm in diameter, no definite filling defect although sensitivity reduced by motion artifact. The mild dilation of the extrahepatic biliary tree could be due to physiologic response to cholecystectomy. 3. Trace amount of fluid in the lesser sac, significance uncertain. No overt upper  abdominal ascites. 4. Subcutaneous flank edema bilaterally. 5. Moderate cardiomegaly with small right pleural effusion. 6. 1.9 cm airspace opacity in the right lower lobe as shown on recent chest CT, probably pneumonia. 7. Wall thickening in the distal esophagus, stomach antrum, and duodenum, suspicious for esophagitis, antritis, and duodenitis. No definite ulcer. Electronically Signed   By: Van Clines M.D.   On: 10/02/2016 11:36   Mr Abdomen Mrcp Moise Boring Contast  Result Date: 10/02/2016 CLINICAL DATA:  Generalized weakness and shortness of breath. Reflux. Pneumonia. Chronic kidney disease. Elevated liver function tests. Heart failure. EXAM: MRI ABDOMEN WITHOUT AND WITH CONTRAST (INCLUDING MRCP) TECHNIQUE: Multiplanar multisequence MR imaging of the abdomen was performed both before and after the administration of intravenous contrast. Heavily T2-weighted images of the biliary and pancreatic ducts were obtained, and three-dimensional MRCP images were rendered by post processing. CONTRAST:  39mL MULTIHANCE GADOBENATE DIMEGLUMINE 529 MG/ML IV SOLN COMPARISON:  06/18/2016 FINDINGS: Despite efforts by the technologist and patient, motion artifact is present on today's exam and could not be eliminated. This reduces exam sensitivity and specificity. Lower chest: Right pleural effusion with 1.9 cm opacity peripherally in the  right lower lobe, favor pneumonia over malignancy. Moderate cardiomegaly. Circumferential wall thickening of the distal esophagus. Hepatobiliary: Several tiny hepatic cysts including a 4 mm cyst peripherally in the right hepatic lobe on image 53/2103. Common bile duct up to 9 mm. Cholecystectomy. No focal liver lesion. Mild periportal edema. MRCP images are limited by considerable motion artifact, no obvious filling defect in the biliary tree. Pancreas:  Trace fluid in the lesser sac.  Pancreas unremarkable. Spleen:  Unremarkable Adrenals/Urinary Tract: Mild scarring in the left kidney upper  pole. Bosniak category 1 cyst of the right kidney upper pole. Adrenal glands normal. Perirenal stranding bilaterally, likely chronic. Stomach/Bowel: Suspected mild wall thickening in the stomach, notably in the gastric antrum. Questionable duodenal wall thickening. No ulcer is identified. Vascular/Lymphatic:  Unremarkable Other: Bilateral flank edema. On the right side this extends along the serratus anterior muscle. Musculoskeletal: Unremarkable IMPRESSION: 1. Mild periportal edema, nonspecific. No worrisome focal liver lesion. 2. Extrahepatic biliary tree measures 9 mm in diameter, no definite filling defect although sensitivity reduced by motion artifact. The mild dilation of the extrahepatic biliary tree could be due to physiologic response to cholecystectomy. 3. Trace amount of fluid in the lesser sac, significance uncertain. No overt upper abdominal ascites. 4. Subcutaneous flank edema bilaterally. 5. Moderate cardiomegaly with small right pleural effusion. 6. 1.9 cm airspace opacity in the right lower lobe as shown on recent chest CT, probably pneumonia. 7. Wall thickening in the distal esophagus, stomach antrum, and duodenum, suspicious for esophagitis, antritis, and duodenitis. No definite ulcer. Electronically Signed   By: Van Clines M.D.   On: 10/02/2016 11:36    Microbiology: Recent Results (from the past 240 hour(s))  Culture, blood (routine x 2) Call MD if unable to obtain prior to antibiotics being given     Status: None (Preliminary result)   Collection Time: 09/28/16 10:00 PM  Result Value Ref Range Status   Specimen Description BLOOD LEFT ANTECUBITAL  Final   Special Requests BOTTLES DRAWN AEROBIC AND ANAEROBIC 10CC EA  Final   Culture NO GROWTH 4 DAYS  Final   Report Status PENDING  Incomplete  Culture, blood (routine x 2) Call MD if unable to obtain prior to antibiotics being given     Status: None (Preliminary result)   Collection Time: 09/28/16 10:07 PM  Result Value Ref  Range Status   Specimen Description BLOOD LEFT HAND  Final   Special Requests IN PEDIATRIC BOTTLE 3CC  Final   Culture NO GROWTH 4 DAYS  Final   Report Status PENDING  Incomplete     Labs: Basic Metabolic Panel:  Recent Labs Lab 09/28/16 1605  09/29/16 0546 09/30/16 0537 10/01/16 0350 10/02/16 0507 10/03/16 0419  NA 142  < > 145 141 140 139 137  K 2.6*  < > 3.1* 3.0* 2.5* 2.9* 4.5  CL 109  < > 115* 111 103 101 102  CO2 23  --  24 21* 29 29 24   GLUCOSE 135*  < > 93 87 92 116* 108*  BUN 20  < > 18 13 11 13 13   CREATININE 1.47*  < > 1.39* 1.18* 1.31* 1.38* 1.23*  CALCIUM 8.3*  --  7.9* 8.3* 8.1* 8.0* 8.0*  MG 2.1  --   --   --   --   --   --   < > = values in this interval not displayed. Liver Function Tests:  Recent Labs Lab 09/28/16 1605 09/29/16 [id number] 09/30/16 [id number] 10/02/16 0507 10/03/16 [id number]  AST 75* 60* 78* 33 35  ALT 90* 74* 84* 50 41  ALKPHOS 425* 352* 493* 342* 277*  BILITOT 1.1 0.5 1.1 0.8 1.0  PROT 7.3 6.9 7.0 6.9 6.6  ALBUMIN 3.1* 2.7* 2.7* 2.4* 2.4*   No results for input(s): LIPASE, AMYLASE in the last 168 hours. No results for input(s): AMMONIA in the last 168 hours. CBC:  Recent Labs Lab 09/28/16 1605  09/29/16 0546 09/30/16 0537 10/01/16 0350 10/02/16 0507 10/03/16 0419  WBC 9.0  --  10.7* 11.0* 12.7* 12.9* 13.0*  NEUTROABS 4.7  --  5.0  --   --   --   --   HGB 10.6*  < > 9.8* 10.5* 10.6* 11.0* 11.6*  HCT 33.0*  < > 30.4* 33.5* 32.9* 34.9* 36.9  MCV 81.7  --  81.9 82.5 81.6 81.9 82.6  PLT 234  --  221 213 218 262 286  < > = values in this interval not displayed. Cardiac Enzymes:  Recent Labs Lab 09/29/16 0031 09/29/16 0546 09/29/16 1157  TROPONINI 0.61* 0.62* 0.52*   BNP: BNP (last 3 results)  Recent Labs  03/16/16 1236 04/23/16 1554 09/28/16 1605  BNP 227.0* 80.7 780.4*    ProBNP (last 3 results) No results for input(s): PROBNP in the last 8760 hours.  CBG: No results for input(s): GLUCAP in the last 168  hours.     SignedDomenic Polite MD.  [facility] 10/03/2016, 12:49 PM

## 2016-10-03 NOTE — Progress Notes (Addendum)
Patient Name: Tara [name] Date of Encounter: 10/03/2016  Primary Cardiologist: Dr. Kearney Hard Problem List     Principal Problem:   HCAP (healthcare-associated pneumonia) Active Problems:   Nonischemic cardiomyopathy (Tara [name])   Prediabetes   Leg edema, left   Acute respiratory alkalosis   Hypokalemia   Prolonged Q-T interval on ECG   Chronic kidney disease (CKD), stage III (moderate)   Elevated alkaline phosphatase level   Elevated LFTs   Elevated troponin   Elevated brain natriuretic peptide (BNP) level   Anemia   DVT (deep venous thrombosis) ([facility])   Acute on chronic combined systolic and diastolic heart failure ([place])   Demand ischemia ([place])    Subjective   Tara [name] well.  Denies chest pain or shortness of breath.  Prefers to go home instead of a rehab.   Inpatient Medications    Scheduled Meds: . cefpodoxime  200 mg Oral Q12H  . clonazePAM  1 mg Oral QHS  . docusate sodium  100 mg Oral BID  . fluticasone  2 spray Each Nare Daily  . furosemide  40 mg Oral Daily  . metoCLOPramide  5 mg Oral BID  . metoprolol tartrate  25 mg Oral BID  . pantoprazole  40 mg Oral BID  . polyethylene glycol  17 g Oral BID  . pravastatin  40 mg Oral q1800  . Rivaroxaban  15 mg Oral BID WC  . [START ON 10/22/2016] Rivaroxaban  20 mg Oral Q supper  . sacubitril-valsartan  1 tablet Oral BID  . spironolactone  25 mg Oral Daily   Continuous Infusions:  PRN Meds: zolpidem   Vital Signs    Vitals:   10/02/16 1156 10/02/16 1333 10/02/16 2113 10/03/16 0510  BP: 138/73 124/72 120/82 (!) 112/59  Pulse: 67 66 78 75  Resp:  18 18 17   Temp:  98.4 F (36.9 C) 98.4 F (36.9 C) 98.6 F (37 C)  TempSrc:  Oral Oral Oral  SpO2:  99% 97% 97%  Weight:        Intake/Output Summary (Last 24 hours) at 10/03/16 1012 Last data filed at 10/03/16 [id number]  Gross per 24 hour  Intake             1060 ml  Output              900 ml  Net              160 ml   Filed Weights   09/28/16 2101    Weight: 87.1 kg (192 lb 1.6 oz)    Physical Exam    GEN: Well nourished, well developed, in no acute distress.  HEENT: Grossly normal.  Neck: Supple, no JVD, carotid bruits, or masses. Cardiac: RRR.  III/VI systolic murmur at LLSB.  No rubs, or gallops.  Extremities: Trace edema bilaterally.   Radials/DP/PT 2+ and equal bilaterally.  No clubbing, cyanosis Respiratory:  Respirations regular and mildly labored.  CTAB. GI: Soft, nontender, nondistended, BS + x 4. MS: no deformity or atrophy. Skin: warm and dry, no rash. Neuro:  Strength and sensation are intact. Psych: AAOx3.  Normal affect.  Labs    CBC  Recent Labs  10/02/16 0507 10/03/16 0419  WBC 12.9* 13.0*  HGB 11.0* 11.6*  HCT 34.9* 36.9  MCV 81.9 82.6  PLT 262 [id number]   Basic Metabolic Panel  Recent Labs  10/02/16 0507 10/03/16 0419  NA 139 137  K 2.9* 4.5  CL 101 102  CO2  29 24  GLUCOSE 116* 108*  BUN 13 13  CREATININE 1.38* 1.23*  CALCIUM 8.0* 8.0*   Liver Function Tests  Recent Labs  10/02/16 0507 10/03/16 0419  AST 33 35  ALT 50 41  ALKPHOS 342* 277*  BILITOT 0.8 1.0  PROT 6.9 6.6  ALBUMIN 2.4* 2.4*   No results for input(s): LIPASE, AMYLASE in the last 72 hours. Cardiac Enzymes No results for input(s): CKTOTAL, CKMB, CKMBINDEX, TROPONINI in the last 72 hours. BNP Invalid input(s): POCBNP D-Dimer No results for input(s): DDIMER in the last 72 hours. Hemoglobin A1C No results for input(s): HGBA1C in the last 72 hours. Fasting Lipid Panel No results for input(s): CHOL, HDL, LDLCALC, TRIG, CHOLHDL, LDLDIRECT in the last 72 hours. Thyroid Function Tests No results for input(s): TSH, T4TOTAL, T3FREE, THYROIDAB in the last 72 hours.  Invalid input(s): FREET3  Telemetry    n/a  ECG    10/01/16: Sinus rhythm rate 78 bpm.  LAD.  LVH.  - Personally Reviewed  Radiology    Mr 3d Recon At Scanner  Result Date: 10/02/2016 CLINICAL DATA:  Generalized weakness and shortness of breath.  Reflux. Pneumonia. Chronic kidney disease. Elevated liver function tests. Heart failure. EXAM: MRI ABDOMEN WITHOUT AND WITH CONTRAST (INCLUDING MRCP) TECHNIQUE: Multiplanar multisequence MR imaging of the abdomen was performed both before and after the administration of intravenous contrast. Heavily T2-weighted images of the biliary and pancreatic ducts were obtained, and three-dimensional MRCP images were rendered by post processing. CONTRAST:  31mL MULTIHANCE GADOBENATE DIMEGLUMINE 529 MG/ML IV SOLN COMPARISON:  06/18/2016 FINDINGS: Despite efforts by the technologist and patient, motion artifact is present on today's exam and could not be eliminated. This reduces exam sensitivity and specificity. Lower chest: Right pleural effusion with 1.9 cm opacity peripherally in the right lower lobe, favor pneumonia over malignancy. Moderate cardiomegaly. Circumferential wall thickening of the distal esophagus. Hepatobiliary: Several tiny hepatic cysts including a 4 mm cyst peripherally in the right hepatic lobe on image 53/2103. Common bile duct up to 9 mm. Cholecystectomy. No focal liver lesion. Mild periportal edema. MRCP images are limited by considerable motion artifact, no obvious filling defect in the biliary tree. Pancreas:  Trace fluid in the lesser sac.  Pancreas unremarkable. Spleen:  Unremarkable Adrenals/Urinary Tract: Mild scarring in the left kidney upper pole. Bosniak category 1 cyst of the right kidney upper pole. Adrenal glands normal. Perirenal stranding bilaterally, likely chronic. Stomach/Bowel: Suspected mild wall thickening in the stomach, notably in the gastric antrum. Questionable duodenal wall thickening. No ulcer is identified. Vascular/Lymphatic:  Unremarkable Other: Bilateral flank edema. On the right side this extends along the serratus anterior muscle. Musculoskeletal: Unremarkable IMPRESSION: 1. Mild periportal edema, nonspecific. No worrisome focal liver lesion. 2. Extrahepatic biliary tree  measures 9 mm in diameter, no definite filling defect although sensitivity reduced by motion artifact. The mild dilation of the extrahepatic biliary tree could be due to physiologic response to cholecystectomy. 3. Trace amount of fluid in the lesser sac, significance uncertain. No overt upper abdominal ascites. 4. Subcutaneous flank edema bilaterally. 5. Moderate cardiomegaly with small right pleural effusion. 6. 1.9 cm airspace opacity in the right lower lobe as shown on recent chest CT, probably pneumonia. 7. Wall thickening in the distal esophagus, stomach antrum, and duodenum, suspicious for esophagitis, antritis, and duodenitis. No definite ulcer. Electronically Signed   By: Van Clines M.D.   On: 10/02/2016 11:36   Mr Abdomen Mrcp Moise Boring Contast  Result Date: 10/02/2016 CLINICAL DATA:  Generalized  weakness and shortness of breath. Reflux. Pneumonia. Chronic kidney disease. Elevated liver function tests. Heart failure. EXAM: MRI ABDOMEN WITHOUT AND WITH CONTRAST (INCLUDING MRCP) TECHNIQUE: Multiplanar multisequence MR imaging of the abdomen was performed both before and after the administration of intravenous contrast. Heavily T2-weighted images of the biliary and pancreatic ducts were obtained, and three-dimensional MRCP images were rendered by post processing. CONTRAST:  80mL MULTIHANCE GADOBENATE DIMEGLUMINE 529 MG/ML IV SOLN COMPARISON:  06/18/2016 FINDINGS: Despite efforts by the technologist and patient, motion artifact is present on today's exam and could not be eliminated. This reduces exam sensitivity and specificity. Lower chest: Right pleural effusion with 1.9 cm opacity peripherally in the right lower lobe, favor pneumonia over malignancy. Moderate cardiomegaly. Circumferential wall thickening of the distal esophagus. Hepatobiliary: Several tiny hepatic cysts including a 4 mm cyst peripherally in the right hepatic lobe on image 53/2103. Common bile duct up to 9 mm. Cholecystectomy. No focal  liver lesion. Mild periportal edema. MRCP images are limited by considerable motion artifact, no obvious filling defect in the biliary tree. Pancreas:  Trace fluid in the lesser sac.  Pancreas unremarkable. Spleen:  Unremarkable Adrenals/Urinary Tract: Mild scarring in the left kidney upper pole. Bosniak category 1 cyst of the right kidney upper pole. Adrenal glands normal. Perirenal stranding bilaterally, likely chronic. Stomach/Bowel: Suspected mild wall thickening in the stomach, notably in the gastric antrum. Questionable duodenal wall thickening. No ulcer is identified. Vascular/Lymphatic:  Unremarkable Other: Bilateral flank edema. On the right side this extends along the serratus anterior muscle. Musculoskeletal: Unremarkable IMPRESSION: 1. Mild periportal edema, nonspecific. No worrisome focal liver lesion. 2. Extrahepatic biliary tree measures 9 mm in diameter, no definite filling defect although sensitivity reduced by motion artifact. The mild dilation of the extrahepatic biliary tree could be due to physiologic response to cholecystectomy. 3. Trace amount of fluid in the lesser sac, significance uncertain. No overt upper abdominal ascites. 4. Subcutaneous flank edema bilaterally. 5. Moderate cardiomegaly with small right pleural effusion. 6. 1.9 cm airspace opacity in the right lower lobe as shown on recent chest CT, probably pneumonia. 7. Wall thickening in the distal esophagus, stomach antrum, and duodenum, suspicious for esophagitis, antritis, and duodenitis. No definite ulcer. Electronically Signed   By: Van Clines M.D.   On: 10/02/2016 11:36    Cardiac Studies   Echo 03/21/16: Study Conclusions  - Left ventricle: The cavity size was mildly dilated. Wall   thickness was normal. Systolic function was moderately reduced.   The estimated ejection fraction was in the range of 35% to 40%.   The study is not technically sufficient to allow evaluation of LV   diastolic function. - Aortic  valve: Valve area (VTI): 1.99 cm^2. Valve area (Vmax):   2.19 cm^2. Valve area (Vmean): 2.23 cm^2. - Mitral valve: There was mild regurgitation. - Left atrium: The atrium was mildly to moderately dilated. - Pulmonary arteries: PA peak pressure: 49 mm Hg (S).  Echo 10/01/16: Study Conclusions  - Left ventricle: The cavity size was normal. Systolic function was   severely reduced. The estimated ejection fraction was in the   range of 20% to 25%. Diffuse hypokinesis. Features are consistent   with a pseudonormal left ventricular filling pattern, with   concomitant abnormal relaxation and increased filling pressure   (grade 2 diastolic dysfunction). - Ventricular septum: The contour showed diastolic flattening. - Mitral valve: There was moderate regurgitation. - Left atrium: The atrium was mildly dilated. - Tricuspid valve: There was severe regurgitation. - Pulmonary  arteries: Systolic pressure was mildly increased. PA   peak pressure: 35 mm Hg (S).  Impressions:  - Compared to the prior study, there has been no significant   interval change.  Patient Profile     Tara [name] is a 1F with NICM LVEF 35-40%, hypertensive heart disease, hyperlipidemia, SVT and breast cancer s/p chemo/XRT and mastectomy here with pneumonia who was noted to have a DVT and acute on chronic systolic and diastolic heart failure  Assessment & Plan    # Acute on chronic systolic and diastolic heart failure:   # Hypertensive heart disease:   LVEF this admission reduced to 20-25% from  35-40% 02/2016.  Her volume status has improved significantly.  In/outs were not recorded yesterday for unclear reasons.  She was net positive 340 mL yesterday.  Would plan for lasix 40mg  bid x1 week followed by 40mg  daily. Continue metoprolol, Entresto, and spironolactone.  Potassium is much better on spironolactone.  Case manager may need to assist to make sure she will be able to get Entresto and Xarelto.  We will consolidate  metoprolol to metoprolol succinate which is better for heart failure.  Addendum: Spoke with Tara [name], Tara [name] who reported an episode of near syncope last week.  Plan for 7 day event monitor at discharge.   # Acute DVT: Continue Xarelto 15mg  bid x21 days followed by 20 mg daily for a total of 3 months.   # Demand ischemia: Troponin mildly elevated in a pattern consistent with demand ischemia. She denies chest pain.  We will plan for outpatient stress testing when stable.  # Hyperlipidemia:  Continue pravastatin.   Dispo: Tara [name] was seen by PT 2 days ago and they recommended SNF.  She prefers to go home.  She is doing much better now.  May be helpful to have them re-evaluate to see if she can go home with services.  Stable from a cardiac standpoint.  Signed, Skeet Latch, MD  10/03/2016, 10:12 AM

## 2016-10-03 NOTE — Progress Notes (Signed)
Physical Therapy Treatment Patient Details Name: Tara [name] MRN: [id number] DOB: Jan 09, 1946 Today's Date: 10/03/2016    History of Present Illness 71 year old with medical history significant for remote breast cancer, nonischemic cardiomyopathy, GERD who presented complaining of generalized weakness also complaining of shortness of breath.  Treated with LAsix for volume overload.  Also positive for Left LE DVT.      PT Comments    Pt with improved mobility today. Pt required min guard for safety while transferring but demonstrated good ability to power to stand and good balance once in standing position. Reliant on RW for balance. Pt was able to ambulate 125 ft with min A for safety. Pt ambulates with slow, cautious gait and notes that this is below her baseline speed ("I don't want to fall"). Pt SpO2 remained >90% throughout session on RA. Pt husband notes that he is more concerned with her UE weakness. Paged OT to notify that pt is eager to work with therapy for UE strength. Pt agreeable to have HHPT to address decreased activity tolerance and mobility. Pt and husband comfortable with going home so changed plan. PT will follow acutely.     Follow Up Recommendations  Home health PT;Supervision/Assistance - 24 hour (HHOT)     Equipment Recommendations  Rolling walker with 5" wheels    Recommendations for Other Services       Precautions / Restrictions Precautions Precautions: Fall Restrictions Weight Bearing Restrictions: No    Mobility  Bed Mobility Overal bed mobility: Needs Assistance Bed Mobility: Sit to Supine       Sit to supine: Min guard   General bed mobility comments: Pt in chair on PT arrival. No physical assist required for sit to supine back to bed. HOB elevated. Use of bed rails. Requires extra time  Transfers Overall transfer level: Needs assistance Equipment used: Rolling walker (2 wheeled) Transfers: Sit to/from Stand Sit to Stand: Min guard          General transfer comment: No physical assist required. Min guard for safety  Ambulation/Gait Ambulation/Gait assistance: Min guard Ambulation Distance (Feet): 125 Feet Assistive device: Rolling walker (2 wheeled) Gait Pattern/deviations: Step-through pattern;Decreased stride length;Shuffle;Narrow base of support Gait velocity: decreased Gait velocity interpretation: Below normal speed for age/gender General Gait Details: Pt with slow, shuffled gait using RW. Pt cautious with ambulation and states that she "doesn't want to fall" and is therefore slower than her baseline.    Stairs            Wheelchair Mobility    Modified Rankin (Stroke Patients Only)       Balance Overall balance assessment: Needs assistance;History of Falls Sitting-balance support: No upper extremity supported;Feet supported Sitting balance-Leahy Scale: Fair     Standing balance support: Bilateral upper extremity supported;During functional activity Standing balance-Leahy Scale: Poor Standing balance comment: Reliant on BUE for standing balance                    Cognition Arousal/Alertness: Awake/alert Behavior During Therapy: WFL for tasks assessed/performed Overall Cognitive Status: Within Functional Limits for tasks assessed                      Exercises      General Comments        Pertinent Vitals/Pain Pain Assessment: No/denies pain    Home Living                      Prior Function  PT Goals (current goals can now be found in the care plan section) Acute Rehab PT Goals Patient Stated Goal: to get better PT Goal Formulation: With patient/family Time For Goal Achievement: 10/14/16 Potential to Achieve Goals: Fair Progress towards PT goals: Progressing toward goals    Frequency    Min 3X/week      PT Plan Discharge plan needs to be updated    Co-evaluation             End of Session   Activity Tolerance: Patient  tolerated treatment well Patient left: with call bell/phone within reach;with family/visitor present;in bed     Time: 1112-1129 PT Time Calculation (min) (ACUTE ONLY): 17 min  Charges:  $Gait Training: 8-22 mins                    G Codes:      Tonia Brooms 10/09/16, 1:22 PM Tonia Brooms, SPT [phone]

## 2016-10-03 NOTE — Clinical Social Work Note (Signed)
CSW consulted for New SNF as PT recommended SNF. CSW met with pt and spouse, and they are agreeable to SNF depending on the copays, however they would like to return home. CSW initiated [facility] and it was obtained, however PT's recommendations changed to HHPT and that is the preference of pt and spouse. RNCM following for discharge planning needs. Pt is ready for discharge today. CSW is signing off as no further needs identified.   Darden Dates, MSW, LCSW  Clinical Social Worker  [phone]

## 2016-10-03 NOTE — Care Management Note (Signed)
Case Management Note  Patient Details  Name: ADIVA SKLAR MRN: [id number] Date of Birth: 03-19-1946  Subjective/Objective:                    Action/Plan:  Husband's cell is [id number]  Provided 30 day free cards for Entresto and Xarelto , entered benefit checks  Expected Discharge Date:  10/03/16               Expected Discharge Plan:  [place]  In-House Referral:     Discharge planning Services  CM Consult  Post Acute Care Choice:  Home Health Choice offered to:  Patient  DME Arranged:  Walker rolling DME Agency:  [place] Arranged:  RN, PT [facility] Agency:  [place]  Status of Service:  Completed, signed off  If discussed at [place] of Stay Meetings, dates discussed:    Additional Comments:  Marilu Favre, RN 10/03/2016, 2:04 PM

## 2016-10-04 LAB — CULTURE, BLOOD (ROUTINE X 2)
CULTURE: NO GROWTH
Culture: NO GROWTH

## 2016-10-07 ENCOUNTER — Encounter: Payer: Self-pay | Admitting: Oncology

## 2016-10-07 ENCOUNTER — Ambulatory Visit (INDEPENDENT_AMBULATORY_CARE_PROVIDER_SITE_OTHER): Payer: Medicare Other | Admitting: Oncology

## 2016-10-07 VITALS — BP 120/77 | HR 68 | Temp 97.7°F | Ht <= 58 in | Wt 164.3 lb

## 2016-10-07 DIAGNOSIS — Z9221 Personal history of antineoplastic chemotherapy: Secondary | ICD-10-CM

## 2016-10-07 DIAGNOSIS — E538 Deficiency of other specified B group vitamins: Secondary | ICD-10-CM | POA: Diagnosis not present

## 2016-10-07 DIAGNOSIS — N183 Chronic kidney disease, stage 3 unspecified: Secondary | ICD-10-CM

## 2016-10-07 DIAGNOSIS — R809 Proteinuria, unspecified: Secondary | ICD-10-CM

## 2016-10-07 DIAGNOSIS — Z86718 Personal history of other venous thrombosis and embolism: Secondary | ICD-10-CM

## 2016-10-07 DIAGNOSIS — Z800 Family history of malignant neoplasm of digestive organs: Secondary | ICD-10-CM

## 2016-10-07 DIAGNOSIS — Z86010 Personal history of colonic polyps: Secondary | ICD-10-CM

## 2016-10-07 DIAGNOSIS — Z8249 Family history of ischemic heart disease and other diseases of the circulatory system: Secondary | ICD-10-CM

## 2016-10-07 DIAGNOSIS — Z853 Personal history of malignant neoplasm of breast: Secondary | ICD-10-CM

## 2016-10-07 DIAGNOSIS — Z886 Allergy status to analgesic agent status: Secondary | ICD-10-CM

## 2016-10-07 DIAGNOSIS — Z923 Personal history of irradiation: Secondary | ICD-10-CM

## 2016-10-07 DIAGNOSIS — Z90710 Acquired absence of both cervix and uterus: Secondary | ICD-10-CM

## 2016-10-07 DIAGNOSIS — R700 Elevated erythrocyte sedimentation rate: Secondary | ICD-10-CM

## 2016-10-07 DIAGNOSIS — Z888 Allergy status to other drugs, medicaments and biological substances status: Secondary | ICD-10-CM

## 2016-10-07 DIAGNOSIS — Z9049 Acquired absence of other specified parts of digestive tract: Secondary | ICD-10-CM

## 2016-10-07 DIAGNOSIS — Z8042 Family history of malignant neoplasm of prostate: Secondary | ICD-10-CM

## 2016-10-07 DIAGNOSIS — Z820 Family history of epilepsy and other diseases of the nervous system: Secondary | ICD-10-CM

## 2016-10-07 DIAGNOSIS — D72829 Elevated white blood cell count, unspecified: Secondary | ICD-10-CM

## 2016-10-07 DIAGNOSIS — Z8611 Personal history of tuberculosis: Secondary | ICD-10-CM

## 2016-10-07 DIAGNOSIS — D721 Eosinophilia, unspecified: Secondary | ICD-10-CM

## 2016-10-07 HISTORY — DX: Eosinophilia, unspecified: D72.10

## 2016-10-07 LAB — CBC WITH DIFFERENTIAL/PLATELET
BASOS ABS: 0 10*3/uL (ref 0.0–0.1)
BASOS PCT: 0 %
Eosinophils Absolute: 1.7 10*3/uL — ABNORMAL HIGH (ref 0.0–0.7)
Eosinophils Relative: 21 %
HEMATOCRIT: 40.1 % (ref 36.0–46.0)
HEMOGLOBIN: 12.7 g/dL (ref 12.0–15.0)
LYMPHS ABS: 2.7 10*3/uL (ref 0.7–4.0)
LYMPHS PCT: 33 %
MCH: 26.4 pg (ref 26.0–34.0)
MCHC: 31.7 g/dL (ref 30.0–36.0)
MCV: 83.4 fL (ref 78.0–100.0)
Monocytes Absolute: 0.7 10*3/uL (ref 0.1–1.0)
Monocytes Relative: 8 %
NEUTROS ABS: 3.1 10*3/uL (ref 1.7–7.7)
Neutrophils Relative %: 38 %
Platelets: 308 10*3/uL (ref 150–400)
RBC: 4.81 MIL/uL (ref 3.87–5.11)
RDW: 21.6 % — AB (ref 11.5–15.5)
WBC: 8.2 10*3/uL (ref 4.0–10.5)

## 2016-10-07 LAB — SAVE SMEAR

## 2016-10-07 LAB — SEDIMENTATION RATE: SED RATE: 36 mm/h — AB (ref 0–22)

## 2016-10-07 MED ORDER — FOLIC ACID 1 MG PO TABS: 1.0000 mg | ORAL_TABLET | Freq: Every day | ORAL | 3 refills | Status: DC

## 2016-10-07 NOTE — Patient Instructions (Signed)
Return visit 1st available to review lab results. We will call you if you need to keep the appointment. If everything comes back good, we can cancel it. I will send a report to your doctor

## 2016-10-08 NOTE — Progress Notes (Signed)
New Patient Hematology   Tara [name] [id number] [birth year] 71 y.o. 10/08/2016  CC: Dr. 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 She did well until May 2014 when she presented with SVT. She converted to sinus rhythm with adenosine. Echocardiogram showed a dilated cardiomyopathy systolic ejection fraction 40%, and grade 2 diastolic dysfunction and global hypokinesis. Acute MI was ruled out. She was admitted to the hospital on 06/18/2016 with a 1 week history of intractable nausea vomiting and diarrhea. Previous colonoscopy done in August 2015 negative except for recurrent adenomatous polyps. CT scan of the abdomen showed retention of food in the stomach, thickening of the distal esophagus, gastric antrum, and duodenum. Upper endoscopy on 06/19/2016 confirmed abnormalities in the distal esophagus, stomach, and duodenum with marked edematous mucosa question angioedema versus a submucosal process such as lymphoma. Random biopsies were taken. No dysplasia or malignancy. Findings otherwise notable for a reactive gastropathy and duodenitis. Helicobacter stains negative. Infiltration of tissue with eosinophils not noted or mentioned. She recovered with conservative treatment. CBCs done during that admission showed leukocytosis with white count as high as 23,000 and differential most notable for marked eosinophilia with percent eosinophils as high as 40%. Review of labs back as far as 2009 shows intermittent eosinophilia to a lesser degree but on one occasion in January 2009 33% eosinophils with a total white count of 9400. Eosinophils in general in the normal range since that time until the recent September 2017 admission. She was recently  admitted to the hospital again between January 7 and 10/03/2016. White count in the range of 9000-13000 but no differential counts were recorded. Reason for admission was generalized weakness and increasing dyspnea. There was a right lower lobe pneumonia, a small right pleural effusion, and a CT angiogram of the chest also showed patchy infiltrates at the left lung base. Calcified hilar and paratracheal adenopathy. 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 She was found to have a acute left lower extremity DVT during the recent admission and was started on Xarelto. Total White count has returned to normal and is 8200 today but there is a persistent when 21% eosinophilia. Hemoglobin 12.7, hematocrit 40, MCV 83, platelets 308,000.  She does give a history of intermittent allergic symptoms usually brought on by stress such as walking to her mailbox in the cold when she can develop paroxysmal cough, wheezing, and rhinitis, and watery eyes. Attacks are worse in the fall. She uses Entex LA antihistamine for relief.  She developed liver function abnormalities noted during the recent January 2018 admission with predominant elevation of alkaline phosphatase and only mild elevations of transaminases and bilirubin. MRCP did not show any evidence for malignancy or retained bile duct stones. She is status post previous cholecystectomy. She denied any history of hepatitis or yellow jaundice. She was screened for hepatitis C one year ago and was negative. Liver functions done on January 12 improving compared with previous transaminases and bilirubin now normal, alkaline phosphatase down to 277 from peak value 493. Albumin low at 2.4. BUN 13, creatinine 1.2 which is her approximate baseline. Urinalysis done on September 27 positive for protein.  Additional lab abnormalities: Folic acid 1.0 on 56/31/4970 with normal B12. Iron studies with serum iron 30, percent saturation 15,  TIBC 205 and ferritin 419 consistent with  anemia of chronic disease and/or acute inflammation.   She has no signs or symptoms of a collagen vascular disorder and specifically denies any arthritis.   PMH: Past Medical History:  Diagnosis Date  . 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 Anxiety   . Breast cancer [facility]) 2008   right - s/p lumpectomy->chemo, radiation  . Depression   . Dysrhythmia    hx SVT  . GERD (gastroesophageal reflux disease)   . H/O: hysterectomy   . Hiatal hernia   . History of cancer chemotherapy   . History of radiation therapy   . Hyperlipidemia   . Nonischemic cardiomyopathy ([place])   . SVT (supraventricular tachycardia) ([place])    short RP SVT documented 5/14  . Systolic CHF [facility])     Past Surgical History:  Procedure Laterality Date  . ABDOMINAL HYSTERECTOMY  1994   fibroids,   . BIOPSY  06/19/2016   Procedure: BIOPSY;  Surgeon: Daneil Dolin, MD;  Location: [facility] ENDO SU[facility];  Service: Endoscopy;;  gastric duodenum  . BREAST SURGERY Right 2008   lumpectomy, cancer  . CHOLECYSTECTOMY  1999  . COLONOSCOPY  2008   Dr. Oneida Alar: multiple polyps. Path not available at time of visit.   Marland Kitchen COLONOSCOPY WITH PROPOFOL N/A 04/25/2014   Dr. Oneida Alar: Multiple tubular adenomas removed. Diverticulosis. Moderate internal hemorrhoids. Next colonoscopy planned for August 2018.  Marland Kitchen ESOPHAGOGASTRODUODENOSCOPY (EGD) WITH PROPOFOL N/A 06/19/2016   Procedure: ESOPHAGOGASTRODUODENOSCOPY (EGD) WITH PROPOFOL;  Surgeon: Daneil Dolin, MD;  Location: [facility];  Service: Endoscopy;  Laterality: N/A;  . POLYPECTOMY N/A 04/25/2014   Procedure: POLYPECTOMY;  Surgeon: Danie Binder, MD;  Location: [facility] ORS;  Service: Endoscopy;  Laterality: N/A;  Ascending and Decending Colon x3 , Transverse colon x2, rectal    Allergies: Allergies  Allergen Reactions  . Aspirin Other (See Comments)    Reports GI bleed with daily use  . Lisinopril Cough  . Sudafed [Pseudoephedrine Hcl]     Pt reports she had drainage in throat that made her  throat hurt.     Medications:  Current Outpatient Prescriptions:  .  azelastine (ASTELIN) 0.1 % nasal spray, Place 2 sprays into both nostrils 2 (two) times daily. Use in each nostril as directed (Patient taking differently: Place 2 sprays into both nostrils 2 (two) times daily as needed for allergies. Use in each nostril as directed), Disp: 30 mL, Rfl: 12 .  cefpodoxime (VANTIN) 200 MG tablet, Take 1 tablet (200 mg total) by mouth every 12 (twelve) hours. For 2days, Disp: 4 tablet, Rfl: 0 .  clonazePAM (KLONOPIN) 0.5 MG tablet, TAKE (1) TABLET BY MOUTH AT BEDTIME., Disp: 90 tablet, Rfl: 0 .  docusate sodium (COLACE) 100 MG capsule, Take 100 mg by mouth 2 (two) times daily., Disp: , Rfl:  .  fexofenadine (ALLEGRA) 180 MG tablet, Take 180 mg by mouth daily as needed for allergies. , Disp: , Rfl:  .  fluticasone (FLONASE) 50 MCG/ACT nasal spray, Place 2 sprays into both nostrils daily., Disp: 16 g, Rfl: 2 .  folic acid (FOLVITE) 1 MG tablet, Take 1 tablet (1 mg total) by mouth daily., Disp: 100 tablet, Rfl: 3 .  furosemide (LASIX) 40 MG tablet, Take 1 tablet (40 mg total) by mouth 2 (two) times daily. For 1 week then CHANGE to '40mg'$  daily, Disp: 30 tablet, Rfl: 0 .  lovastatin (MEVACOR) 40 MG tablet, Take 1 tablet (40 mg total) by mouth at bedtime.,  Disp: 90 tablet, Rfl: 1 .  metoprolol succinate (TOPROL-XL) 50 MG 24 hr tablet, Take 1 tablet (50 mg total) by mouth daily. Take with or immediately following a meal., Disp: 30 tablet, Rfl: 0 .  pantoprazole (PROTONIX) 40 MG tablet, Take 1 tablet (40 mg total) by mouth 2 (two) times daily., Disp: 180 tablet, Rfl: 0 .  Rivaroxaban (XARELTO) 15 MG TABS tablet, Take 1 tablet (15 mg total) by mouth 2 (two) times daily with a meal. Till 1/30 then switch to '20mg'$  daily, Disp: 40 tablet, Rfl: 0 .  [START ON 10/22/2016] rivaroxaban (XARELTO) 20 MG TABS tablet, Take 1 tablet (20 mg total) by mouth daily with supper. Starting on 1/31, Disp: 30 tablet, Rfl: 0 .   sacubitril-valsartan (ENTRESTO) 24-26 MG, Take 1 tablet by mouth 2 (two) times daily., Disp: 60 tablet, Rfl: 0 .  sertraline (ZOLOFT) 50 MG tablet, Take 1 tablet (50 mg total) by mouth daily., Disp: 90 tablet, Rfl: 1 .  spironolactone (ALDACTONE) 25 MG tablet, Take 1 tablet (25 mg total) by mouth daily., Disp: 30 tablet, Rfl: 0 .  zolpidem (AMBIEN) 10 MG tablet, Take 1 tablet (10 mg total) by mouth at bedtime as needed for sleep., Disp: 90 tablet, Rfl: 1  Social History: Married and her husband accompanies her. Retired Marine scientist at [employer].  reports that she has never smoked. She has never used smokeless tobacco. She reports that she does not drink alcohol or use drugs.  Family History: Family History  Problem Relation Age of Onset  . Colon cancer Father 21    died at age 18  . Hypertension Sister   . Dementia Mother   . Cancer Brother 58    prostate  Her mother is still alive at age 29.  Review of Systems: See history of present illness Remaining ROS negative.  Physical Exam: Blood pressure 120/77, pulse 68, temperature 97.7 F (36.5 C), temperature source Oral, height '4\' 8"'$  (1.422 m), weight 164 lb 4.8 oz (74.5 kg), SpO2 96 %. Wt Readings from Last 3 Encounters:  10/07/16 164 lb 4.8 oz (74.5 kg)  09/28/16 192 lb 1.6 oz (87.1 kg)  08/25/16 172 lb (78 kg)     General appearance: Well-nourished African-American woman HENNT: Pharynx no erythema, exudate, mass, or ulcer. No thyromegaly or thyroid nodules Lymph nodes: No cervical, supraclavicular, or axillary lymphadenopathy Breasts: No abnormal skin changes, no dominant mass in either breast Lungs: Clear to auscultation, resonant to percussion throughout Heart: Regular rhythm, no murmur, no gallop, no rub, no click, no edema Abdomen: Soft, nontender, normal bowel sounds, no mass, no organomegaly Extremities: No edema, no calf tenderness Musculoskeletal: no joint deformities GU:  Vascular: Carotid pulses 2+, no  bruits, Neurologic: Alert, oriented, PERRLA, optic discs sharp and vessels normal, no hemorrhage or exudate, cranial nerves grossly normal, motor strength 5 over 5, reflexes 1+ symmetric, upper body coordination normal, gait normal, Skin: No rash or ecchymosis    Lab Results: Lab Results  Component Value Date   WBC 8.2 10/07/2016   HGB 12.7 10/07/2016   HCT 40.1 10/07/2016   MCV 83.4 10/07/2016   PLT 308 10/07/2016     Chemistry      Component Value Date/Time   NA 137 10/03/2016 0419   NA 143 10/12/2014 1001   K 4.5 10/03/2016 0419   K 3.9 10/12/2014 1001   CL 102 10/03/2016 0419   CO2 24 10/03/2016 0419   CO2 25 10/12/2014 1001   BUN 13 10/03/2016  0419   BUN 13.9 10/12/2014 1001   CREATININE 1.23 (H) 10/03/2016 0419   CREATININE 1.22 (H) 08/25/2016 1620   CREATININE 1.0 10/12/2014 1001      Component Value Date/Time   CALCIUM 8.0 (L) 10/03/2016 0419   CALCIUM 8.6 10/12/2014 1001   ALKPHOS 277 (H) 10/03/2016 0419   ALKPHOS 102 10/12/2014 1001   AST 35 10/03/2016 0419   AST 33 10/12/2014 1001   ALT 41 10/03/2016 0419   ALT 27 10/12/2014 1001   BILITOT 1.0 10/03/2016 0419   BILITOT 0.25 10/12/2014 1001     ESR 36 mm     Review of peripheral blood film:  Normochromic normocytic red cells, no spherocytes, no schistocytes, no polychromasia, no red cell inclusions. Increased population of mature appearing eosinophils. Subpopulation of reactive, benign-appearing lymphocytes with abundant cytoplasm and mature nuclei. Platelets appear normal in number and morphology.   Radiological Studies: See discussion above.  Impression: #1. Reactive leukocytosis in the setting of acute pneumonia and acute left lower extremity DVT. Total white count now returned to normal.  #2. Chronic eosinophilia with acute rises at times of acute illness Nonischemic cardiomyopathy, acute GI symptoms with findings of diffusely edematous esophageal, gastric, and duodenal mucosa, esophageal,  gastric, and duodenal wall thickening, acute, noninfectious hepatitis, sinus and upper respiratory symptoms, chronic intermittent allergic symptoms, renal dysfunction and proteinuria, in aggregate make a primary hypereosinophilic condition suspect. Absence of an extremely high white count, or any lymphadenopathy or organomegaly makes a myeloproliferative disorder unlikely and allergic or collagen vascular disease associated etiologies more likely.  #3. Folic acid deficiency I am prescribing folic acid 1 mg daily.  Recommendation: I would focus on her medications first. Consider discontinuation of sacubitril/valsartan, spironolactone, and lovastatin and monitor eosinophil response. I would screen her for reactivation TB given her history of TB as a child. I would consider screening for underlying vasculitis/collagen vascular disorder such as Sjogren's or systemic sclerosis. Check ANA, ANCA,SS, Rho, SPEP, IFE, IgE level. 24 urine for creatinine clearance and total protein. I would consider a cardiac MRI to screen for an infiltrative cardiomyopathy such as AL or Transthyretin amyloidosis. Consider Serum tryptase screen for systemic mastocytosis Consider Bone marrow aspiration and biopsy if above evaluation unrevealing. I will have her return to have this testing done.  Murriel Hopper, MD, [place]  Hematology-Oncology/Internal Medicine  10/08/2016, 1:31 PM

## 2016-10-10 NOTE — Addendum Note (Signed)
Addended by: Truddie Crumble on: 10/10/2016 11:15 AM   Modules accepted: Orders

## 2016-10-20 ENCOUNTER — Other Ambulatory Visit (INDEPENDENT_AMBULATORY_CARE_PROVIDER_SITE_OTHER): Payer: Medicare Other

## 2016-10-20 DIAGNOSIS — N183 Chronic kidney disease, stage 3 unspecified: Secondary | ICD-10-CM

## 2016-10-20 DIAGNOSIS — D721 Eosinophilia, unspecified: Secondary | ICD-10-CM

## 2016-10-20 DIAGNOSIS — D72829 Elevated white blood cell count, unspecified: Secondary | ICD-10-CM | POA: Diagnosis not present

## 2016-10-20 DIAGNOSIS — Z8611 Personal history of tuberculosis: Secondary | ICD-10-CM | POA: Diagnosis not present

## 2016-10-20 DIAGNOSIS — R809 Proteinuria, unspecified: Secondary | ICD-10-CM | POA: Diagnosis not present

## 2016-10-20 DIAGNOSIS — R700 Elevated erythrocyte sedimentation rate: Secondary | ICD-10-CM

## 2016-10-20 LAB — URINALYSIS, ROUTINE W REFLEX MICROSCOPIC
BILIRUBIN URINE: NEGATIVE
Glucose, UA: NEGATIVE mg/dL
Hgb urine dipstick: NEGATIVE
KETONES UR: NEGATIVE mg/dL
NITRITE: NEGATIVE
PROTEIN: NEGATIVE mg/dL
Specific Gravity, Urine: 1.02 (ref 1.005–1.030)
pH: 5.5 (ref 5.0–8.0)

## 2016-10-20 LAB — URINALYSIS, MICROSCOPIC (REFLEX): RBC / HPF: NONE SEEN RBC/hpf (ref 0–5)

## 2016-10-20 NOTE — Addendum Note (Signed)
Addended by: Truddie Crumble on: 10/20/2016 10:52 AM   Modules accepted: Orders

## 2016-10-21 ENCOUNTER — Other Ambulatory Visit: Payer: Self-pay | Admitting: Family Medicine

## 2016-10-22 ENCOUNTER — Other Ambulatory Visit (INDEPENDENT_AMBULATORY_CARE_PROVIDER_SITE_OTHER): Payer: Medicare Other

## 2016-10-22 DIAGNOSIS — R809 Proteinuria, unspecified: Secondary | ICD-10-CM

## 2016-10-22 DIAGNOSIS — N183 Chronic kidney disease, stage 3 unspecified: Secondary | ICD-10-CM

## 2016-10-23 LAB — UPEP/TP, 24-HR URINE
ALBUMIN, U: 12.4 %
ALPHA 1 UR: 2.6 %
ALPHA 2 UR: 14.6 %
Beta, Urine: 31.4 %
GAMMA UR: 39.1 %
PROTEIN UR: 19.3 mg/dL
Protein, 24H Urine: 116 mg/24 hr (ref 30–150)

## 2016-10-23 LAB — CREATININE CLEARANCE, URINE, 24 HOUR
CREAT CLEAR: 14 mL/min — AB (ref 88–128)
CREATININE 24H UR: 509 mg/(24.h) — AB (ref 800–1800)
Creatinine, Ser: 2.57 mg/dL — ABNORMAL HIGH (ref 0.57–1.00)
Creatinine, Urine: 84.8 mg/dL
GFR, EST AFRICAN AMERICAN: 21 mL/min/{1.73_m2} — AB (ref >59)
GFR, EST NON AFRICAN AMERICAN: 18 mL/min/{1.73_m2} — AB (ref >59)

## 2016-10-23 LAB — QUANTIFERON IN TUBE
QFT TB AG MINUS NIL VALUE: 0.08 IU/mL
QUANTIFERON MITOGEN VALUE: 1.62 IU/mL
QUANTIFERON TB AG VALUE: 0.12 IU/mL
QUANTIFERON TB GOLD: NEGATIVE
Quantiferon Nil Value: 0.04 IU/mL

## 2016-10-23 LAB — QUANTIFERON TB GOLD ASSAY (BLOOD)

## 2016-10-24 ENCOUNTER — Inpatient Hospital Stay (HOSPITAL_COMMUNITY)
Admission: EM | Admit: 2016-10-24 | Discharge: 2016-10-26 | DRG: 683 | Disposition: A | Discharge status: Home / Self Care | Payer: Medicare Other | Attending: Internal Medicine | Admitting: Internal Medicine

## 2016-10-24 ENCOUNTER — Encounter (HOSPITAL_COMMUNITY): Payer: Self-pay | Admitting: Nurse Practitioner

## 2016-10-24 ENCOUNTER — Telehealth: Payer: Self-pay | Admitting: Family Medicine

## 2016-10-24 ENCOUNTER — Emergency Department (HOSPITAL_COMMUNITY): Payer: Medicare Other

## 2016-10-24 ENCOUNTER — Telehealth: Payer: Self-pay

## 2016-10-24 ENCOUNTER — Encounter: Payer: Self-pay | Admitting: Family Medicine

## 2016-10-24 DIAGNOSIS — K219 Gastro-esophageal reflux disease without esophagitis: Secondary | ICD-10-CM | POA: Diagnosis not present

## 2016-10-24 DIAGNOSIS — R05 Cough: Secondary | ICD-10-CM | POA: Diagnosis not present

## 2016-10-24 DIAGNOSIS — I5042 Chronic combined systolic (congestive) and diastolic (congestive) heart failure: Secondary | ICD-10-CM | POA: Diagnosis not present

## 2016-10-24 DIAGNOSIS — Z7901 Long term (current) use of anticoagulants: Secondary | ICD-10-CM

## 2016-10-24 DIAGNOSIS — Z90710 Acquired absence of both cervix and uterus: Secondary | ICD-10-CM | POA: Diagnosis not present

## 2016-10-24 DIAGNOSIS — Z9221 Personal history of antineoplastic chemotherapy: Secondary | ICD-10-CM | POA: Diagnosis not present

## 2016-10-24 DIAGNOSIS — I428 Other cardiomyopathies: Secondary | ICD-10-CM | POA: Diagnosis present

## 2016-10-24 DIAGNOSIS — Z86718 Personal history of other venous thrombosis and embolism: Secondary | ICD-10-CM

## 2016-10-24 DIAGNOSIS — I82409 Acute embolism and thrombosis of unspecified deep veins of unspecified lower extremity: Secondary | ICD-10-CM | POA: Diagnosis present

## 2016-10-24 DIAGNOSIS — N183 Chronic kidney disease, stage 3 unspecified: Secondary | ICD-10-CM | POA: Diagnosis present

## 2016-10-24 DIAGNOSIS — Z9049 Acquired absence of other specified parts of digestive tract: Secondary | ICD-10-CM

## 2016-10-24 DIAGNOSIS — Z888 Allergy status to other drugs, medicaments and biological substances status: Secondary | ICD-10-CM

## 2016-10-24 DIAGNOSIS — N179 Acute kidney failure, unspecified: Principal | ICD-10-CM | POA: Diagnosis present

## 2016-10-24 DIAGNOSIS — E785 Hyperlipidemia, unspecified: Secondary | ICD-10-CM | POA: Diagnosis not present

## 2016-10-24 DIAGNOSIS — Z800 Family history of malignant neoplasm of digestive organs: Secondary | ICD-10-CM

## 2016-10-24 DIAGNOSIS — E669 Obesity, unspecified: Secondary | ICD-10-CM | POA: Diagnosis not present

## 2016-10-24 DIAGNOSIS — E860 Dehydration: Secondary | ICD-10-CM | POA: Diagnosis not present

## 2016-10-24 DIAGNOSIS — D72829 Elevated white blood cell count, unspecified: Secondary | ICD-10-CM | POA: Diagnosis present

## 2016-10-24 DIAGNOSIS — Z8042 Family history of malignant neoplasm of prostate: Secondary | ICD-10-CM

## 2016-10-24 DIAGNOSIS — I130 Hypertensive heart and chronic kidney disease with heart failure and stage 1 through stage 4 chronic kidney disease, or unspecified chronic kidney disease: Secondary | ICD-10-CM | POA: Diagnosis present

## 2016-10-24 DIAGNOSIS — I82402 Acute embolism and thrombosis of unspecified deep veins of left lower extremity: Secondary | ICD-10-CM | POA: Diagnosis not present

## 2016-10-24 DIAGNOSIS — Z6835 Body mass index (BMI) 35.0-35.9, adult: Secondary | ICD-10-CM | POA: Diagnosis not present

## 2016-10-24 DIAGNOSIS — Z8249 Family history of ischemic heart disease and other diseases of the circulatory system: Secondary | ICD-10-CM

## 2016-10-24 DIAGNOSIS — Z886 Allergy status to analgesic agent status: Secondary | ICD-10-CM

## 2016-10-24 DIAGNOSIS — Z923 Personal history of irradiation: Secondary | ICD-10-CM

## 2016-10-24 DIAGNOSIS — K449 Diaphragmatic hernia without obstruction or gangrene: Secondary | ICD-10-CM | POA: Diagnosis present

## 2016-10-24 DIAGNOSIS — Z853 Personal history of malignant neoplasm of breast: Secondary | ICD-10-CM | POA: Diagnosis not present

## 2016-10-24 DIAGNOSIS — F419 Anxiety disorder, unspecified: Secondary | ICD-10-CM | POA: Diagnosis present

## 2016-10-24 DIAGNOSIS — I5022 Chronic systolic (congestive) heart failure: Secondary | ICD-10-CM

## 2016-10-24 DIAGNOSIS — F329 Major depressive disorder, single episode, unspecified: Secondary | ICD-10-CM | POA: Diagnosis present

## 2016-10-24 DIAGNOSIS — Z7951 Long term (current) use of inhaled steroids: Secondary | ICD-10-CM

## 2016-10-24 LAB — COMPREHENSIVE METABOLIC PANEL
ALT: 28 U/L (ref 14–54)
AST: 43 U/L — AB (ref 15–41)
Albumin: 3.7 g/dL (ref 3.5–5.0)
Alkaline Phosphatase: 97 U/L (ref 38–126)
Anion gap: 11 (ref 5–15)
BILIRUBIN TOTAL: 0.7 mg/dL (ref 0.3–1.2)
BUN: 74 mg/dL — AB (ref 6–20)
CO2: 20 mmol/L — ABNORMAL LOW (ref 22–32)
CREATININE: 2.39 mg/dL — AB (ref 0.44–1.00)
Calcium: 9 mg/dL (ref 8.9–10.3)
Chloride: 103 mmol/L (ref 101–111)
GFR calc Af Amer: 23 mL/min — ABNORMAL LOW (ref >60)
GFR, EST NON AFRICAN AMERICAN: 19 mL/min — AB (ref >60)
Glucose, Bld: 114 mg/dL — ABNORMAL HIGH (ref 65–99)
Potassium: 5.9 mmol/L — ABNORMAL HIGH (ref 3.5–5.1)
Sodium: 134 mmol/L — ABNORMAL LOW (ref 135–145)
TOTAL PROTEIN: 8.4 g/dL — AB (ref 6.5–8.1)

## 2016-10-24 LAB — BASIC METABOLIC PANEL
BUN / CREAT RATIO: 31 — AB (ref 12–28)
BUN: 60 mg/dL — ABNORMAL HIGH (ref 8–27)
CO2: 19 mmol/L (ref 18–29)
Calcium: 9.7 mg/dL (ref 8.7–10.3)
Chloride: 102 mmol/L (ref 96–106)
Creatinine, Ser: 1.94 mg/dL — ABNORMAL HIGH (ref 0.57–1.00)
GFR calc Af Amer: 30 mL/min/{1.73_m2} — ABNORMAL LOW (ref >59)
GFR, EST NON AFRICAN AMERICAN: 26 mL/min/{1.73_m2} — AB (ref >59)
GLUCOSE: 102 mg/dL — AB (ref 65–99)
Potassium: 4.6 mmol/L (ref 3.5–5.2)
SODIUM: 142 mmol/L (ref 134–144)

## 2016-10-24 LAB — URINALYSIS, ROUTINE W REFLEX MICROSCOPIC
BILIRUBIN URINE: NEGATIVE
Glucose, UA: NEGATIVE mg/dL
HGB URINE DIPSTICK: NEGATIVE
KETONES UR: NEGATIVE mg/dL
Leukocytes, UA: NEGATIVE
Nitrite: NEGATIVE
PH: 5 (ref 5.0–8.0)
Protein, ur: NEGATIVE mg/dL
Specific Gravity, Urine: 1.006 (ref 1.005–1.030)

## 2016-10-24 LAB — CBC WITH DIFFERENTIAL/PLATELET
BASOS ABS: 0 10*3/uL (ref 0.0–0.1)
Basophils Relative: 0 %
Eosinophils Absolute: 1 10*3/uL — ABNORMAL HIGH (ref 0.0–0.7)
Eosinophils Relative: 9 %
HEMATOCRIT: 42.2 % (ref 36.0–46.0)
Hemoglobin: 13.6 g/dL (ref 12.0–15.0)
LYMPHS ABS: 2.8 10*3/uL (ref 0.7–4.0)
LYMPHS PCT: 26 %
MCH: 26.2 pg (ref 26.0–34.0)
MCHC: 32.2 g/dL (ref 30.0–36.0)
MCV: 81.2 fL (ref 78.0–100.0)
MONO ABS: 0.4 10*3/uL (ref 0.1–1.0)
Monocytes Relative: 4 %
NEUTROS ABS: 6.6 10*3/uL (ref 1.7–7.7)
Neutrophils Relative %: 61 %
Platelets: 170 10*3/uL (ref 150–400)
RBC: 5.2 MIL/uL — AB (ref 3.87–5.11)
RDW: 18.3 % — AB (ref 11.5–15.5)
WBC: 10.8 10*3/uL — ABNORMAL HIGH (ref 4.0–10.5)

## 2016-10-24 LAB — IMMUNOFIXATION ELECTROPHORESIS
IGG (IMMUNOGLOBIN G), SERUM: 2239 mg/dL — AB (ref 700–1600)
IgA/Immunoglobulin A, Serum: 489 mg/dL — ABNORMAL HIGH (ref 87–352)
IgM (Immunoglobulin M), Srm: 216 mg/dL (ref 26–217)
Total Protein: 8.2 g/dL (ref 6.0–8.5)

## 2016-10-24 LAB — SJOGREN'S SYNDROME ANTIBODS(SSA + SSB)
ENA SSA (RO) Ab: 0.2 AI (ref 0.0–0.9)
ENA SSB (LA) Ab: <0.2 AI (ref 0.0–0.9)

## 2016-10-24 LAB — MPO/PR-3 (ANCA) ANTIBODIES

## 2016-10-24 LAB — PROTEIN ELECTROPHORESIS, SERUM
A/G RATIO SPE: 0.8 (ref 0.7–1.7)
ALBUMIN ELP: 3.6 g/dL (ref 2.9–4.4)
ALPHA 1: 0.2 g/dL (ref 0.0–0.4)
Alpha 2: 0.9 g/dL (ref 0.4–1.0)
Beta: 1.3 g/dL (ref 0.7–1.3)
Gamma Globulin: 2.3 g/dL — ABNORMAL HIGH (ref 0.4–1.8)
Globulin, Total: 4.6 g/dL — ABNORMAL HIGH (ref 2.2–3.9)

## 2016-10-24 LAB — POTASSIUM: Potassium: 4.6 mmol/L (ref 3.5–5.1)

## 2016-10-24 LAB — ANTI-SCLERODERMA ANTIBODY

## 2016-10-24 LAB — ANTINUCLEAR ANTIBODIES, IFA: ANA Titer 1: NEGATIVE

## 2016-10-24 LAB — I-STAT CG4 LACTIC ACID, ED: LACTIC ACID, VENOUS: 2.22 mmol/L — AB (ref 0.5–1.9)

## 2016-10-24 LAB — IGE: IgE (Immunoglobulin E), Serum: 859 IU/mL — ABNORMAL HIGH (ref 0–100)

## 2016-10-24 MED ORDER — SODIUM CHLORIDE 0.9 % IV SOLN
INTRAVENOUS | Status: DC
  Administered 2016-10-24: 23:00:00 via INTRAVENOUS

## 2016-10-24 MED ORDER — SODIUM CHLORIDE 0.9 % IV BOLUS (SEPSIS)
1000.0000 mL | Freq: Once | INTRAVENOUS | Status: AC
  Administered 2016-10-24: 1000 mL via INTRAVENOUS

## 2016-10-24 NOTE — Telephone Encounter (Signed)
Direct telephone contact made with patient , and I advised her to go the emergency room for evaluation for dehydration, based on recent labs she had this week. She verbalized understanding No one is currently at home with her but her spouse is expected in shortly.I advised her she could have the hospital call me at home if he had any questions If discharged, she will be followed up in the office on Monday. Pt stated she had no xarelto  However , she does have a 1 month supply in the pharmacy

## 2016-10-24 NOTE — H&P (Signed)
History and Physical    Tara [name] [id number] DOB: 02/12/1946 DOA: 10/24/2016  PCP: [name], MD  Patient coming from: Home  Chief Complaint: Decreased PO intake, dehydration  HPI: Tara [name] is a 71 y.o. female with medical history significant of HFrEF with EF of 20-25%, HLD, breast cancer, recent admission for pneumonia where she was also found to have a DVT, presents with worsening renal function.  Her daughter provides some of the history.  Tara [name] has had a difficult few months with multiple hospitalizations.  She was admitted in October for CAP, and then in January for HCAP.  During this second hospitalization, she became severely volume overloaded and required IV lasix.  After this hospitalization, she was advised to only drink 1.5L of fluid a day.  However, Tara [name] was very concerned about this and cut back to almost only 1 cup of water per day (8 oz), while she was still taking her lasix, entresto and spironolactone.  Her daughter notes that she was afraid to get volume overloaded again.  She does endorse this decreased PO intake.  She was found to have a rising Cr and BUN and her PCP and Hematologist Dr. Beryle Beams advised her to come and be seen for this.  Tara [name] is asymptomatic.  She reports no confusion, itching or lethargy.  She does note that she has run out of her Xarelto.  She was supposed to start taking the once daily medication on 1/30.  She does also have a mild non productive cough.  She denies vomiting, diarrhea, chest pain, abdominal pain.    ED Course: Tara [name] was initially hypotensive, this improved with fluids int he ED.  She was noted to have a BUN of 77 and Cr of 2.39. S he also had a Lactate of 2.22.  K was initially 5.9 and then rechecked at 4.6.  WBC was 10.8, but this appears to be chronic with a mild eosinophilic predominance.    Review of Systems: As per HPI otherwise 10 point review of systems negative.   Past Medical History:  Diagnosis  Date  . 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 Anxiety   . Breast cancer [facility]) 2008   right - s/p lumpectomy->chemo, radiation  . Depression   . Dysrhythmia    hx SVT  . GERD (gastroesophageal reflux disease)   . H/O: hysterectomy   . Hiatal hernia   . History of cancer chemotherapy   . History of radiation therapy   . Hyperlipidemia   . Nonischemic cardiomyopathy ([place])   . SVT (supraventricular tachycardia) ([place])    short RP SVT documented 5/14  . Systolic CHF [facility])     Past Surgical History:  Procedure Laterality Date  . ABDOMINAL HYSTERECTOMY  1994   fibroids,   . BIOPSY  06/19/2016   Procedure: BIOPSY;  Surgeon: Daneil Dolin, MD;  Location: [facility] ENDO SU[facility];  Service: Endoscopy;;  gastric duodenum  . BREAST SURGERY Right 2008   lumpectomy, cancer  . CHOLECYSTECTOMY  1999  . COLONOSCOPY  2008   Dr. Oneida Alar: multiple polyps. Path not available at time of visit.   Marland Kitchen COLONOSCOPY WITH PROPOFOL N/A 04/25/2014   Dr. Oneida Alar: Multiple tubular adenomas removed. Diverticulosis. Moderate internal hemorrhoids. Next colonoscopy planned for August 2018.  Marland Kitchen ESOPHAGOGASTRODUODENOSCOPY (EGD) WITH PROPOFOL N/A 06/19/2016   Procedure: ESOPHAGOGASTRODUODENOSCOPY (EGD) WITH PROPOFOL;  Surgeon: Daneil Dolin, MD;  Location: [facility] ENDO SUITE;  Service: Endoscopy;  Laterality: N/A;  . POLYPECTOMY N/A 04/25/2014  Procedure: POLYPECTOMY;  Surgeon: Danie Binder, MD;  Location: [facility] ORS;  Service: Endoscopy;  Laterality: N/A;  Ascending and Decending Colon x3 , Transverse colon x2, rectal     reports that she has never smoked. She has never used smokeless tobacco. She reports that she does not drink alcohol or use drugs.  Allergies  Allergen Reactions  . Aspirin Other (See Comments)    Reports GI bleed with daily use  . Lisinopril Cough  . Sudafed [Pseudoephedrine Hcl]     Pt reports she had drainage in throat that made her throat hurt.    Reviewed with patient Family History  Problem Relation Age of  Onset  . Colon cancer Father 3    died at age 92  . Hypertension Sister   . Dementia Mother   . Cancer Brother 64    prostate    Prior to Admission medications   Medication Sig Start Date End Date Taking? Authorizing Provider  clonazePAM (KLONOPIN) 0.5 MG tablet TAKE (1) TABLET BY MOUTH AT BEDTIME. 08/20/16  Yes [name], MD  docusate sodium (COLACE) 100 MG capsule Take 100 mg by mouth 2 (two) times daily.   Yes Historical Provider, MD  fexofenadine (ALLEGRA) 180 MG tablet Take 180 mg by mouth daily as needed for allergies.    Yes Historical Provider, MD  fluticasone (FLONASE) 50 MCG/ACT nasal spray Place 2 sprays into both nostrils daily. 11/26/15  Yes [name], MD  folic acid (FOLVITE) 1 MG tablet Take 1 tablet (1 mg total) by mouth daily. 10/07/16 10/07/17 Yes Annia Belt, MD  furosemide (LASIX) 40 MG tablet Take 1 tablet (40 mg total) by mouth 2 (two) times daily. For 1 week then CHANGE to 40mg  daily Patient taking differently: Take 40 mg by mouth daily. For 1 week then CHANGE to 40mg  daily 10/03/16  Yes Domenic Polite, MD  lovastatin (MEVACOR) 40 MG tablet Take 1 tablet (40 mg total) by mouth at bedtime. 09/18/16  Yes [name], MD  metoprolol succinate (TOPROL-XL) 50 MG 24 hr tablet Take 1 tablet (50 mg total) by mouth daily. Take with or immediately following a meal. 10/04/16  Yes Domenic Polite, MD  pantoprazole (PROTONIX) 40 MG tablet Take 1 tablet (40 mg total) by mouth 2 (two) times daily. 07/10/16  Yes [name], MD  sacubitril-valsartan (ENTRESTO) 24-26 MG Take 1 tablet by mouth 2 (two) times daily. 10/03/16  Yes Domenic Polite, MD  sertraline (ZOLOFT) 50 MG tablet Take 1 tablet (50 mg total) by mouth daily. 07/10/16  Yes [name], MD  spironolactone (ALDACTONE) 25 MG tablet Take 1 tablet (25 mg total) by mouth daily. 10/04/16  Yes Domenic Polite, MD  zolpidem (AMBIEN) 10 MG tablet Take 1 tablet (10 mg total) by mouth at bedtime as  needed for sleep. 03/03/16  Yes [name], MD  azelastine (ASTELIN) 0.1 % nasal spray Place 2 sprays into both nostrils 2 (two) times daily. Use in each nostril as directed Patient not taking: Reported on 10/24/2016 10/09/15   [name], MD                rivaroxaban (XARELTO) 20 MG TABS tablet Take 1 tablet (20 mg total) by mouth daily with supper. Starting on 1/31 10/22/16   Domenic Polite, MD    Physical Exam: Vitals:   10/24/16 2145 10/24/16 2200 10/25/16 0015 10/25/16 0030  BP: 108/58 102/75 117/58 (!) 102/52  Pulse: (!) 55 (!) 58 Marland Kitchen)  57 (!) 54  Resp:      Temp:      TempSrc:      SpO2: 94% 95% 96% 97%    Constitutional: NAD, calm, comfortable, eating sandwich  Vitals:   10/24/16 2145 10/24/16 2200 10/25/16 0015 10/25/16 0030  BP: 108/58 102/75 117/58 (!) 102/52  Pulse: (!) 55 (!) 58 (!) 57 (!) 54  Resp:      Temp:      TempSrc:      SpO2: 94% 95% 96% 97%   Eyes: anicteric sclerae, no conjunctival injection.  ENMT: Mucous membranes are moist. Posterior pharynx clear of any exudate or lesions.N Respiratory: clear to auscultation bilaterally, no wheezing, no crackles. Normal respiratory effort. No accessory muscle use.  Cardiovascular: Regular rate and rhythm, no murmurs / rubs / gallops. Non pitting extremity edema.  Abdomen: no tenderness, or distention.   Bowel sounds positive.  Musculoskeletal:  No joint deformity upper and lower extremities. Normal muscle tone.  Skin: no rashes, lesions, ulcers.  Neurologic: Sensation intact.  Moving all extremities equally.   Psychiatric: Normal judgment and insight. Alert and oriented x 3. Normal mood.   Labs on Admission: I have personally reviewed following labs and imaging studies  CBC:  Recent Labs Lab 10/24/16 1909  WBC 10.8*  NEUTROABS 6.6  HGB 13.6  HCT 42.2  MCV 81.2  PLT 123XX123   Basic Metabolic Panel:  Recent Labs Lab 10/20/16 1015 10/22/16 0800 10/24/16 1909 10/24/16 2110  NA 142  --  134*   --   K 4.6  --  5.9* 4.6  CL 102  --  103  --   CO2 19  --  20*  --   GLUCOSE 102*  --  114*  --   BUN 60*  --  74*  --   CREATININE 1.94* 2.57* 2.39*  --   CALCIUM 9.7  --  9.0  --    GFR: CrCl cannot be calculated (Unknown ideal weight.). Liver Function Tests:  Recent Labs Lab 10/20/16 1015 10/24/16 1909  AST  --  43*  ALT  --  28  ALKPHOS  --  97  BILITOT  --  0.7  PROT 8.2 8.4*  ALBUMIN  --  3.7   No results for input(s): LIPASE, AMYLASE in the last 168 hours. No results for input(s): AMMONIA in the last 168 hours. Coagulation Profile: No results for input(s): INR, PROTIME in the last 168 hours. Cardiac Enzymes: No results for input(s): CKTOTAL, CKMB, CKMBINDEX, TROPONINI in the last 168 hours. BNP (last 3 results) No results for input(s): PROBNP in the last 8760 hours. HbA1C: No results for input(s): HGBA1C in the last 72 hours. CBG: No results for input(s): GLUCAP in the last 168 hours. Lipid Profile: No results for input(s): CHOL, HDL, LDLCALC, TRIG, CHOLHDL, LDLDIRECT in the last 72 hours. Thyroid Function Tests: No results for input(s): TSH, T4TOTAL, FREET4, T3FREE, THYROIDAB in the last 72 hours. Anemia Panel: No results for input(s): VITAMINB12, FOLATE, FERRITIN, TIBC, IRON, RETICCTPCT in the last 72 hours. Urine analysis:    Component Value Date/Time   COLORURINE COLORLESS (A) 10/24/2016 1858   APPEARANCEUR CLEAR 10/24/2016 1858   LABSPEC 1.006 10/24/2016 1858   PHURINE 5.0 10/24/2016 1858   GLUCOSEU NEGATIVE 10/24/2016 1858   HGBUR NEGATIVE 10/24/2016 1858   BILIRUBINUR NEGATIVE 10/24/2016 1858   KETONESUR NEGATIVE 10/24/2016 1858   PROTEINUR NEGATIVE 10/24/2016 1858   UROBILINOGEN 0.2 02/07/2013 1859   NITRITE NEGATIVE 10/24/2016 1858  LEUKOCYTESUR NEGATIVE 10/24/2016 1858     Radiological Exams on Admission: Dg Chest 2 View  Result Date: 10/24/2016 CLINICAL DATA:  Dehydration in cough. EXAM: CHEST  2 VIEW COMPARISON:  09/28/2016 FINDINGS:  Chronic cardiomegaly. Negative mediastinal contours. Remote granulomatous disease affecting the right mediastinum, hilum, and lung. There is no edema, consolidation, effusion, or pneumothorax. Postoperative right axilla. Cholecystectomy. IMPRESSION: No evidence of acute cardiopulmonary disease. Electronically Signed   By: Monte Fantasia M.D.   On: 10/24/2016 19:57    Assessment/Plan Dehydration/AKI on Chronic kidney disease (CKD), stage III (moderate) - This appears to be related to decreased PO intake of fluids in the setting of remaining on 2 diuretics and entresto for her heart failure.  - BUN/Cr ratio is 30 - UA unremarkable - If renal function not improving as expected, consider renal ultrasound - IVF with NS at 125/hr for 8 hours, she received a 1L bolus in the ED - Monitor for signs of volume overload.   - Trend lactic acid - Trend Cr  Nonischemic cardiomyopathy with chronic combined CHF - She has severe systolic dysfunction (123456) - Strict I/O - Daily weight - Have placed a time stop on fluids as she is prone to volume overload - Hold lasix, spironolactone, entresto for now, restart in near future given severity of disease - Continue metoprolol    Leukocytosis - Reviewed Dr. Azucena Freed note, evaluating for drug cause, TB, rheumatological cause of her elevated WBC with eosinophilia.  Follow up outpatient.     DVT (deep venous thrombosis) ([place]) - Started on Xarelto, supposed to transition to 20mg  daily dosing on 1/30, but ran out of medication - Restart xarelto at 20mg  daily (confirmed with pharmacy to transition to this dose)    HLD - Continue statin  Anxiety - Continue home klonipin  Cough, non productive - Trial of mucinex.  - No return of pneumonia noted on CXR    DVT prophylaxis: Xarelto Code Status: Full Family Communication: Husband and daughter at bedside Disposition Plan: Pending renal function, 1-2 days  Admission status: Obs, Med surg   Gilles Chiquito MD [facility] Pager [phone]  If 7PM-7AM, please contact night-coverage [email or web address] Password [email or web address]  10/25/2016, 12:40 AM

## 2016-10-24 NOTE — ED Provider Notes (Signed)
[place] DEPT Provider Note   CSN: [id number] Arrival date & time: 10/24/16  1830     History   Chief Complaint Chief Complaint  Patient presents with  . Dehydration    HPI Tara [name] is a 71 y.o. female.  Patient is a 71 year old female with a history of CHF, breast cancer status post chemotherapy and radiation, GERD presenting today per the request of her doctor for concerns for dehydration. Patient was admitted for pneumonia in January and since being home she feels that she is doing okay. She started coughing last night with a nonproductive cough but denies any dizziness, fevers, shortness of breath or swelling. She saw Dr. Beryle Beams and was found to have an increasing creatinine and concern for dehydration. She denies any vomiting or diarrhea but states maybe she's not drinking enough fluids. She denies taking any blood pressure medications. She currently has no complaints of chest pain, abdominal pain or headache.   The history is provided by the patient and the spouse.    Past Medical History:  Diagnosis Date  . 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 Anxiety   . Breast cancer [facility]) 2008   right - s/p lumpectomy->chemo, radiation  . Depression   . Dysrhythmia    hx SVT  . GERD (gastroesophageal reflux disease)   . H/O: hysterectomy   . Hiatal hernia   . History of cancer chemotherapy   . History of radiation therapy   . Hyperlipidemia   . Nonischemic cardiomyopathy ([place])   . SVT (supraventricular tachycardia) ([place])    short RP SVT documented 5/14  . Systolic CHF [facility])     Patient Active Problem List   Diagnosis Date Noted  . 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 DVT (deep venous thrombosis) ([place])   . Acute on chronic combined systolic and diastolic heart failure ([place])   . Demand ischemia ([place])   . Acute respiratory alkalosis 09/29/2016  . Hypokalemia 09/29/2016  . Prolonged Q-T interval on ECG 09/29/2016  . Chronic kidney disease (CKD), stage III  (moderate) 09/29/2016  . Elevated alkaline phosphatase level 09/29/2016  . Elevated LFTs 09/29/2016  . Elevated troponin 09/29/2016  . Elevated brain natriuretic peptide (BNP) level 09/29/2016  . Anemia 09/29/2016  . HCAP (healthcare-associated pneumonia) 09/28/2016  . Leukocytosis 09/18/2016  . Leg edema, left 09/18/2016  . Nausea with vomiting 07/17/2016  . Abdominal pain 07/17/2016  . Hospital discharge follow-up 07/06/2016  . Duodenitis   . Gastric distention   . Abnormal CT scan, esophagus   . Upper airway cough syndrome  vs Cough variant asthma  04/09/2016  . H/O fall 03/23/2016  . Chronic coughing 03/17/2016  . Poor vision 12/11/2014  . Seasonal allergies 08/08/2014  . Family history of colon cancer 03/30/2014  . Osteoporosis 02/14/2014  . Prediabetes 02/05/2014  . Metabolic syndrome X [id number]  . SVT (supraventricular tachycardia) ([place]) 03/14/2013  . Nonischemic cardiomyopathy ([place]) 03/14/2013  . Vitamin D deficiency 07/23/2010  . Obesity, Class II, BMI 35-39.9, with comorbidity 07/04/2009  . Malignant neoplasm of right breast ([place]) 12/13/2007  . Hyperlipidemia 12/13/2007  . ANXIETY, CHRONIC 12/13/2007  . DEPRESSION 12/13/2007  . GERD 12/13/2007    Past Surgical History:  Procedure Laterality Date  . ABDOMINAL HYSTERECTOMY  1994   fibroids,   . BIOPSY  06/19/2016   Procedure: BIOPSY;  Surgeon: Daneil Dolin, MD;  Location[facility];  Service: Endoscopy;;  gastric duodenum  . BREAST SURGERY Right 2008   lumpectomy, cancer  . CHOLECYSTECTOMY  1999  . COLONOSCOPY  2008   Dr. Oneida Alar: multiple polyps. Path not available at time of visit.   Marland Kitchen COLONOSCOPY WITH PROPOFOL N/A 04/25/2014   Dr. Oneida Alar: Multiple tubular adenomas removed. Diverticulosis. Moderate internal hemorrhoids. Next colonoscopy planned for August 2018.  Marland Kitchen ESOPHAGOGASTRODUODENOSCOPY (EGD) WITH PROPOFOL N/A 06/19/2016   Procedure: ESOPHAGOGASTRODUODENOSCOPY (EGD) WITH PROPOFOL;  Surgeon: Daneil Dolin, MD;  Location: [facility] ENDO SU[facility];  Service: Endoscopy;  Laterality: N/A;  . POLYPECTOMY N/A 04/25/2014   Procedure: POLYPECTOMY;  Surgeon: Danie Binder, MD;  Location: [facility] ORS;  Service: Endoscopy;  Laterality: N/A;  Ascending and Decending Colon x3 , Transverse colon x2, rectal    OB History    No data available       Home Medications    Prior to Admission medications   Medication Sig Start Date End Date Taking? Authorizing Provider  azelastine (ASTELIN) 0.1 % nasal spray Place 2 sprays into both nostrils 2 (two) times daily. Use in each nostril as directed Patient taking differently: Place 2 sprays into both nostrils 2 (two) times daily as needed for allergies. Use in each nostril as directed 10/09/15   [name], MD  cefpodoxime (VANTIN) 200 MG tablet Take 1 tablet (200 mg total) by mouth every 12 (twelve) hours. For 2days 10/03/16   Domenic Polite, MD  clonazePAM (KLONOPIN) 0.5 MG tablet TAKE (1) TABLET BY MOUTH AT BEDTIME. 08/20/16   [name], MD  docusate sodium (COLACE) 100 MG capsule Take 100 mg by mouth 2 (two) times daily.    Historical Provider, MD  fexofenadine (ALLEGRA) 180 MG tablet Take 180 mg by mouth daily as needed for allergies.     Historical Provider, MD  fluticasone (FLONASE) 50 MCG/ACT nasal spray Place 2 sprays into both nostrils daily. 11/26/15   [name], MD  folic acid (FOLVITE) 1 MG tablet Take 1 tablet (1 mg total) by mouth daily. 10/07/16 10/07/17  Annia Belt, MD  furosemide (LASIX) 40 MG tablet Take 1 tablet (40 mg total) by mouth 2 (two) times daily. For 1 week then CHANGE to 40mg  daily 10/03/16   Domenic Polite, MD  lovastatin (MEVACOR) 40 MG tablet Take 1 tablet (40 mg total) by mouth at bedtime. 09/18/16   [name], MD  metoprolol succinate (TOPROL-XL) 50 MG 24 hr tablet Take 1 tablet (50 mg total) by mouth daily. Take with or immediately following a meal. 10/04/16   Domenic Polite, MD  pantoprazole (PROTONIX) 40  MG tablet Take 1 tablet (40 mg total) by mouth 2 (two) times daily. 07/10/16   [name], MD  Rivaroxaban (XARELTO) 15 MG TABS tablet Take 1 tablet (15 mg total) by mouth 2 (two) times daily with a meal. Till 1/30 then switch to 20mg  daily 10/03/16 10/21/16  Domenic Polite, MD  rivaroxaban (XARELTO) 20 MG TABS tablet Take 1 tablet (20 mg total) by mouth daily with supper. Starting on 1/31 10/22/16   Domenic Polite, MD  sacubitril-valsartan (ENTRESTO) 24-26 MG Take 1 tablet by mouth 2 (two) times daily. 10/03/16   Domenic Polite, MD  sertraline (ZOLOFT) 50 MG tablet Take 1 tablet (50 mg total) by mouth daily. 07/10/16   [name], MD  spironolactone (ALDACTONE) 25 MG tablet Take 1 tablet (25 mg total) by mouth daily. 10/04/16   Domenic Polite, MD  zolpidem (AMBIEN) 10 MG tablet Take 1 tablet (10 mg total) by mouth at bedtime as needed for sleep. 03/03/16   Joycelyn Schmid  Bartholome Bill, MD    Family History Family History  Problem Relation Age of Onset  . Colon cancer Father 10    died at age 48  . Hypertension Sister   . Dementia Mother   . Cancer Brother 31    prostate    Social History Social History  Substance Use Topics  . Smoking status: Never Smoker  . Smokeless tobacco: Never Used  . Alcohol use No     Allergies   Aspirin; Lisinopril; and Sudafed [pseudoephedrine hcl]   Review of Systems Review of Systems  All other systems reviewed and are negative.    Physical Exam Updated Vital Signs BP (!) 78/43 (BP Location: Left Arm)   Pulse (!) 57   Temp 98.6 F (37 C) (Oral)   Resp 18   SpO2 95%   Physical Exam  Constitutional: She is oriented to person, place, and time. She appears well-developed and well-nourished. No distress.  HENT:  Head: Normocephalic and atraumatic.  Mouth/Throat: Oropharynx is clear and moist.  Eyes: Conjunctivae and EOM are normal. Pupils are equal, round, and reactive to light.  Neck: Normal range of motion. Neck supple.    Cardiovascular: Normal rate, regular rhythm and intact distal pulses.   No murmur heard. Pulmonary/Chest: Effort normal and breath sounds normal. No respiratory distress. She has no wheezes. She has no rales.  Wet sounding cough on exam  Abdominal: Soft. She exhibits no distension. There is no tenderness. There is no rebound and no guarding.  Musculoskeletal: Normal range of motion. She exhibits no edema or tenderness.  Neurological: She is alert and oriented to person, place, and time. She has normal strength. No sensory deficit.  Skin: Skin is warm and dry. No rash noted. No erythema.  Psychiatric: She has a normal mood and affect. Her behavior is normal.  Nursing note and vitals reviewed.    ED Treatments / Results  Labs (all labs ordered are listed, but only abnormal results are displayed) Labs Reviewed  CBC WITH DIFFERENTIAL/PLATELET - Abnormal; Notable for the following:       Result Value   WBC 10.8 (*)    RBC 5.20 (*)    RDW 18.3 (*)    Eosinophils Absolute 1.0 (*)    All other components within normal limits  COMPREHENSIVE METABOLIC PANEL - Abnormal; Notable for the following:    Sodium 134 (*)    Potassium 5.9 (*)    CO2 20 (*)    Glucose, Bld 114 (*)    BUN 74 (*)    Creatinine, Ser 2.39 (*)    Total Protein 8.4 (*)    AST 43 (*)    GFR calc non Af Amer 19 (*)    GFR calc Af Amer 23 (*)    All other components within normal limits  URINALYSIS, ROUTINE W REFLEX MICROSCOPIC - Abnormal; Notable for the following:    Color, Urine COLORLESS (*)    All other components within normal limits  I-STAT CG4 LACTIC ACID, ED - Abnormal; Notable for the following:    Lactic Acid, Venous 2.22 (*)    All other components within normal limits  POTASSIUM    EKG  EKG Interpretation None       Radiology Dg Chest 2 View  Result Date: 10/24/2016 CLINICAL DATA:  Dehydration in cough. EXAM: CHEST  2 VIEW COMPARISON:  09/28/2016 FINDINGS: Chronic cardiomegaly. Negative  mediastinal contours. Remote granulomatous disease affecting the right mediastinum, hilum, and lung. There is no edema, consolidation,  effusion, or pneumothorax. Postoperative right axilla. Cholecystectomy. IMPRESSION: No evidence of acute cardiopulmonary disease. Electronically Signed   By: Monte Fantasia M.D.   On: 10/24/2016 19:57    Procedures Procedures (including critical care time)  Medications Ordered in ED Medications  0.9 %  sodium chloride infusion (not administered)  sodium chloride 0.9 % bolus 1,000 mL (0 mLs Intravenous Stopped 10/24/16 2154)     Initial Impression / Assessment and Plan / ED Course  I have reviewed the triage vital signs and the nursing notes.  Pertinent labs & imaging results that were available during my care of the patient were reviewed by me and considered in my medical decision making (see chart for details).    Patient with multiple medical problems presenting today based on the request of her doctor for concern for dehydration. Patient's creatinine has been increasing over the last week. It was greater than 2, 2 days ago with elevated BUN.  Today on arrival patient is hypotensive with normal heart rate and temperature. She does complain of a new onset cough last night but denies any shortness of breath or infectious symptoms. She has no obvious signs of fluid overload today on exam. Patient denies any nausea, vomiting or diarrhea. She has no chest pain or abdominal pain. We'll give IV fluids check for new developing infection with chest x-ray. CBC, CMP, UA and lactate pending.  10:31 PM Labs consistent with AKI from unknown cause may be combo of being on ARB and being dehydrated.  Will admit for hydration and serial creatinine.  Final Clinical Impressions(s) / ED Diagnoses   Final diagnoses:  AKI (acute kidney injury) ([place])  Dehydration    New Prescriptions New Prescriptions   No medications on file     Blanchie Dessert, MD 10/24/16 2231

## 2016-10-24 NOTE — Telephone Encounter (Signed)
I spoke directly with oncology, main concern is recent labs show dehydration, best that pt go to the ED on my review of labs , she will also need a f/u with me on Monday as there is the suggestion of discontinuing first diovan, by hematology, to see if this will  Make a difference in the high eosinophil count that she has, and this to be followed by another one of her meds if this not make a difference, I will also have to co ordinate with cardiology  I tried speaking with her directly, called her mobile phone and asked her to call the office, if she does not call back before you leave , I will ask Dr.Nelson to be on the listen out for a call from her as she is on call this weekend

## 2016-10-24 NOTE — ED Notes (Addendum)
Lactic Acid  2.22 (CALL FROM LAB)

## 2016-10-24 NOTE — Telephone Encounter (Signed)
Dr Beryle Beams called and is going to be forwarding you a message on [place]. He is concerned she may be dehydrated and her renal function is deteriorating. In a week her creatinine has went from 1.9 to 2.6. Wanted you to be aware. His cell number is [phone]. Wants a call back today to discuss Tara [name],

## 2016-10-24 NOTE — ED Triage Notes (Signed)
Pt presents with c/o dehydration. She was called by her PCP today and told to come to ED for further evaluation of lab work completed in office this week which showed dehydration. The patient denies any complaints now, but state she came to the ED as her pcp instructed. The patient and her family are very frustrated about her care because they feel like she has been to multiple doctors appointments recently and they do not understand the actual plan for the patient and as if they are just being "shuffled around "

## 2016-10-25 DIAGNOSIS — K449 Diaphragmatic hernia without obstruction or gangrene: Secondary | ICD-10-CM | POA: Diagnosis not present

## 2016-10-25 DIAGNOSIS — N179 Acute kidney failure, unspecified: Principal | ICD-10-CM

## 2016-10-25 DIAGNOSIS — E785 Hyperlipidemia, unspecified: Secondary | ICD-10-CM | POA: Diagnosis not present

## 2016-10-25 DIAGNOSIS — K219 Gastro-esophageal reflux disease without esophagitis: Secondary | ICD-10-CM | POA: Diagnosis not present

## 2016-10-25 DIAGNOSIS — I428 Other cardiomyopathies: Secondary | ICD-10-CM

## 2016-10-25 DIAGNOSIS — E669 Obesity, unspecified: Secondary | ICD-10-CM | POA: Diagnosis not present

## 2016-10-25 DIAGNOSIS — Z9049 Acquired absence of other specified parts of digestive tract: Secondary | ICD-10-CM | POA: Diagnosis not present

## 2016-10-25 DIAGNOSIS — Z6835 Body mass index (BMI) 35.0-35.9, adult: Secondary | ICD-10-CM | POA: Diagnosis not present

## 2016-10-25 DIAGNOSIS — I130 Hypertensive heart and chronic kidney disease with heart failure and stage 1 through stage 4 chronic kidney disease, or unspecified chronic kidney disease: Secondary | ICD-10-CM | POA: Diagnosis not present

## 2016-10-25 DIAGNOSIS — E860 Dehydration: Secondary | ICD-10-CM

## 2016-10-25 DIAGNOSIS — N183 Chronic kidney disease, stage 3 (moderate): Secondary | ICD-10-CM | POA: Diagnosis not present

## 2016-10-25 DIAGNOSIS — Z923 Personal history of irradiation: Secondary | ICD-10-CM | POA: Diagnosis not present

## 2016-10-25 DIAGNOSIS — I82402 Acute embolism and thrombosis of unspecified deep veins of left lower extremity: Secondary | ICD-10-CM | POA: Diagnosis not present

## 2016-10-25 DIAGNOSIS — I5042 Chronic combined systolic (congestive) and diastolic (congestive) heart failure: Secondary | ICD-10-CM | POA: Diagnosis not present

## 2016-10-25 DIAGNOSIS — Z853 Personal history of malignant neoplasm of breast: Secondary | ICD-10-CM | POA: Diagnosis not present

## 2016-10-25 DIAGNOSIS — Z7901 Long term (current) use of anticoagulants: Secondary | ICD-10-CM | POA: Diagnosis not present

## 2016-10-25 DIAGNOSIS — D72829 Elevated white blood cell count, unspecified: Secondary | ICD-10-CM

## 2016-10-25 DIAGNOSIS — Z9221 Personal history of antineoplastic chemotherapy: Secondary | ICD-10-CM | POA: Diagnosis not present

## 2016-10-25 DIAGNOSIS — Z90710 Acquired absence of both cervix and uterus: Secondary | ICD-10-CM | POA: Diagnosis not present

## 2016-10-25 DIAGNOSIS — Z800 Family history of malignant neoplasm of digestive organs: Secondary | ICD-10-CM | POA: Diagnosis not present

## 2016-10-25 LAB — COMPREHENSIVE METABOLIC PANEL
ALBUMIN: 3 g/dL — AB (ref 3.5–5.0)
ALK PHOS: 81 U/L (ref 38–126)
ALT: 17 U/L (ref 14–54)
ANION GAP: 7 (ref 5–15)
AST: 20 U/L (ref 15–41)
BILIRUBIN TOTAL: 0.5 mg/dL (ref 0.3–1.2)
BUN: 54 mg/dL — AB (ref 6–20)
CALCIUM: 8.3 mg/dL — AB (ref 8.9–10.3)
CO2: 18 mmol/L — AB (ref 22–32)
Chloride: 113 mmol/L — ABNORMAL HIGH (ref 101–111)
Creatinine, Ser: 1.61 mg/dL — ABNORMAL HIGH (ref 0.44–1.00)
GFR calc Af Amer: 36 mL/min — ABNORMAL LOW (ref >60)
GFR calc non Af Amer: 31 mL/min — ABNORMAL LOW (ref >60)
GLUCOSE: 101 mg/dL — AB (ref 65–99)
Potassium: 3.8 mmol/L (ref 3.5–5.1)
SODIUM: 138 mmol/L (ref 135–145)
TOTAL PROTEIN: 7.1 g/dL (ref 6.5–8.1)

## 2016-10-25 LAB — CBC
HCT: 37.1 % (ref 36.0–46.0)
HEMOGLOBIN: 12 g/dL (ref 12.0–15.0)
MCH: 26.1 pg (ref 26.0–34.0)
MCHC: 32.3 g/dL (ref 30.0–36.0)
MCV: 80.8 fL (ref 78.0–100.0)
Platelets: 139 10*3/uL — ABNORMAL LOW (ref 150–400)
RBC: 4.59 MIL/uL (ref 3.87–5.11)
RDW: 18.4 % — ABNORMAL HIGH (ref 11.5–15.5)
WBC: 10.3 10*3/uL (ref 4.0–10.5)

## 2016-10-25 LAB — LACTIC ACID, PLASMA
Lactic Acid, Venous: 1.4 mmol/L (ref 0.5–1.9)
Lactic Acid, Venous: 1.1 mmol/L (ref 0.5–1.9)

## 2016-10-25 MED ORDER — ZOLPIDEM TARTRATE 5 MG PO TABS
5.0000 mg | ORAL_TABLET | Freq: Every evening | ORAL | Status: DC | PRN
  Administered 2016-10-25 (×2): 5 mg via ORAL
  Filled 2016-10-25 (×2): qty 1

## 2016-10-25 MED ORDER — ACETAMINOPHEN 650 MG RE SUPP: 650.0000 mg | Freq: Four times a day (QID) | RECTAL | Status: DC | PRN

## 2016-10-25 MED ORDER — DM-GUAIFENESIN ER 30-600 MG PO TB12
1.0000 | ORAL_TABLET | Freq: Two times a day (BID) | ORAL | Status: DC
  Administered 2016-10-25 – 2016-10-26 (×3): 1 via ORAL
  Filled 2016-10-25 (×4): qty 1

## 2016-10-25 MED ORDER — SODIUM CHLORIDE 0.9 % IV SOLN
INTRAVENOUS | Status: DC
  Administered 2016-10-25 – 2016-10-26 (×2): via INTRAVENOUS

## 2016-10-25 MED ORDER — CLONAZEPAM 0.5 MG PO TABS
0.5000 mg | ORAL_TABLET | Freq: Every evening | ORAL | Status: DC | PRN
  Administered 2016-10-25 (×2): 0.5 mg via ORAL
  Filled 2016-10-25 (×2): qty 1

## 2016-10-25 MED ORDER — METOPROLOL SUCCINATE ER 50 MG PO TB24
50.0000 mg | ORAL_TABLET | Freq: Every day | ORAL | Status: DC
  Filled 2016-10-25 (×2): qty 1

## 2016-10-25 MED ORDER — PRAVASTATIN SODIUM 40 MG PO TABS
40.0000 mg | ORAL_TABLET | Freq: Every day | ORAL | Status: DC
Start: 2016-10-25 — End: 2016-10-26
  Administered 2016-10-25: 40 mg via ORAL
  Filled 2016-10-25: qty 1

## 2016-10-25 MED ORDER — FOLIC ACID 1 MG PO TABS
1.0000 mg | ORAL_TABLET | Freq: Every day | ORAL | Status: DC
  Administered 2016-10-25 – 2016-10-26 (×2): 1 mg via ORAL
  Filled 2016-10-25 (×2): qty 1

## 2016-10-25 MED ORDER — LORATADINE 10 MG PO TABS
10.0000 mg | ORAL_TABLET | Freq: Every day | ORAL | Status: DC
  Administered 2016-10-25 – 2016-10-26 (×2): 10 mg via ORAL
  Filled 2016-10-25 (×2): qty 1

## 2016-10-25 MED ORDER — RIVAROXABAN 20 MG PO TABS
20.0000 mg | ORAL_TABLET | Freq: Every day | ORAL | Status: DC
  Administered 2016-10-25 – 2016-10-26 (×3): 20 mg via ORAL
  Filled 2016-10-25 (×3): qty 1

## 2016-10-25 MED ORDER — PANTOPRAZOLE SODIUM 40 MG PO TBEC
40.0000 mg | DELAYED_RELEASE_TABLET | Freq: Two times a day (BID) | ORAL | Status: DC
  Administered 2016-10-25 – 2016-10-26 (×3): 40 mg via ORAL
  Filled 2016-10-25 (×3): qty 1

## 2016-10-25 MED ORDER — DOCUSATE SODIUM 100 MG PO CAPS
100.0000 mg | ORAL_CAPSULE | Freq: Two times a day (BID) | ORAL | Status: DC
  Administered 2016-10-25 – 2016-10-26 (×3): 100 mg via ORAL
  Filled 2016-10-25 (×3): qty 1

## 2016-10-25 MED ORDER — SODIUM CHLORIDE 0.9 % IV SOLN
INTRAVENOUS | Status: DC
  Administered 2016-10-25: 02:00:00 via INTRAVENOUS

## 2016-10-25 MED ORDER — FLUTICASONE PROPIONATE 50 MCG/ACT NA SUSP
2.0000 | Freq: Every day | NASAL | Status: DC
  Filled 2016-10-25: qty 16

## 2016-10-25 MED ORDER — ACETAMINOPHEN 325 MG PO TABS: 650.0000 mg | ORAL_TABLET | Freq: Four times a day (QID) | ORAL | Status: DC | PRN

## 2016-10-25 NOTE — Care Management Obs Status (Signed)
[place] NOTIFICATION   Patient Details  Name: Tara [name] MRN: [id number] Date of Birth: Mar 20, 1946   Medicare Observation Status Notification Given:  Yes    Ninfa Meeker, RN 10/25/2016, 9:49 AM

## 2016-10-25 NOTE — Progress Notes (Signed)
PROGRESS NOTE  Tara [name]  [id number] DOB: 20-Jun-1946 DOA: 10/24/2016 PCP: [name], MD Outpatient Specialists:  Subjective: Seen with her husband at bedside, denies any complaints this morning.  Brief Narrative:  Tara [name] is a 71 y.o. female with medical history significant of HFrEF with EF of 20-25%, HLD, breast cancer, recent admission for pneumonia where she was also found to have a DVT, presents with worsening renal function.  Her daughter provides some of the history.  Tara [name] has had a difficult few months with multiple hospitalizations.  She was admitted in October for CAP, and then in January for HCAP.  During this second hospitalization, she became severely volume overloaded and required IV lasix.  After this hospitalization, she was advised to only drink 1.5L of fluid a day.  However, Tara [name] was very concerned about this and cut back to almost only 1 cup of water per day (8 oz), while she was still taking her lasix, entresto and spironolactone.     Assessment & Plan:   Active Problems:   Nonischemic cardiomyopathy (HCC)   Leukocytosis   Chronic kidney disease (CKD), stage III (moderate)   DVT (deep venous thrombosis) (HCC)   Dehydration   AKI (acute kidney injury) ([place])   ARF (acute renal failure) (HCC)   Dehydration/AKI on Chronic kidney disease (CKD), stage III (moderate) -Baseline creatinine of 1.2, presented with BUN/creatinine of 74/2.39. - BUN/Cr ratio is 30 suggesting acute renal failure secondary to dehydration - UA unremarkable - If renal function not improving as expected, consider renal ultrasound - Nephrotoxic medications including interested oh and diuretics held, creatinine improved to 1.6. - Received IV fluid, I will switch to 75 mL/hour, check BMP in a.m.  Nonischemic cardiomyopathy with chronic combined CHF - She has severe systolic dysfunction (123456) - Strict I/O - Daily weight - Have placed a time stop on fluids as she is prone  to volume overload - Hold lasix, spironolactone, entresto for now, restart in near future given severity of disease - Continue metoprolol    Leukocytosis - Reviewed Dr. Azucena Freed note, evaluating for drug cause, TB, rheumatological cause of her elevated WBC with eosinophilia.  Follow up outpatient.     DVT (deep venous thrombosis) ([place]) - Started on Xarelto, supposed to transition to 20mg  daily dosing on 1/30, but ran out of medication - Restart xarelto at 20mg  daily (confirmed with pharmacy to transition to this dose)    HLD - Continue statin  Anxiety - Continue home klonipin  Cough, non productive - Trial of mucinex.  - No return of pneumonia noted on CXR    DVT prophylaxis:  Code Status: Full Code Family Communication:  Disposition Plan:  Diet: Diet Heart Room service appropriate? Yes; Fluid consistency: Thin  Consultants:   None  Procedures:   None  Antimicrobials:   None   Objective: Vitals:   10/25/16 0015 10/25/16 0030 10/25/16 0151 10/25/16 0508  BP: 117/58 (!) 102/52 111/60 (!) 97/47  Pulse: (!) 57 (!) 54 (!) 57 (!) 56  Resp:    18  Temp:   98.4 F (36.9 C) 98.3 F (36.8 C)  TempSrc:   Oral Oral  SpO2: 96% 97% 98% 96%  Weight:    73.4 kg (161 lb 13.1 oz)    Intake/Output Summary (Last 24 hours) at 10/25/16 1128 Last data filed at 10/25/16 1100  Gross per 24 hour  Intake             1745 ml  Output  875 ml  Net              870 ml   Filed Weights   10/25/16 0508  Weight: 73.4 kg (161 lb 13.1 oz)    Examination: General exam: Appears calm and comfortable  Respiratory system: Clear to auscultation. Respiratory effort normal. Cardiovascular system: S1 & S2 heard, RRR. No JVD, murmurs, rubs, gallops or clicks. No pedal edema. Gastrointestinal system: Abdomen is nondistended, soft and nontender. No organomegaly or masses felt. Normal bowel sounds heard. Central nervous system: Alert and oriented. No focal neurological  deficits. Extremities: Symmetric 5 x 5 power. Skin: No rashes, lesions or ulcers Psychiatry: Judgement and insight appear normal. Mood & affect appropriate.   Data Reviewed: I have personally reviewed following labs and imaging studies  CBC:  Recent Labs Lab 10/24/16 1909 10/25/16 0515  WBC 10.8* 10.3  NEUTROABS 6.6  --   HGB 13.6 12.0  HCT 42.2 37.1  MCV 81.2 80.8  PLT 170 [id number]*   Basic Metabolic Panel:  Recent Labs Lab 10/20/16 1015 10/22/16 0800 10/24/16 1909 10/24/16 2110 10/25/16 0515  NA 142  --  134*  --  138  K 4.6  --  5.9* 4.6 3.8  CL 102  --  103  --  113*  CO2 19  --  20*  --  18*  GLUCOSE 102*  --  114*  --  101*  BUN 60*  --  74*  --  54*  CREATININE 1.94* 2.57* 2.39*  --  1.61*  CALCIUM 9.7  --  9.0  --  8.3*   GFR: Estimated Creatinine Clearance: 26.2 mL/min (by C-G formula based on SCr of 1.61 mg/dL (H)). Liver Function Tests:  Recent Labs Lab 10/20/16 1015 10/24/16 1909 10/25/16 0515  AST  --  43* 20  ALT  --  28 17  ALKPHOS  --  97 81  BILITOT  --  0.7 0.5  PROT 8.2 8.4* 7.1  ALBUMIN  --  3.7 3.0*   No results for input(s): LIPASE, AMYLASE in the last 168 hours. No results for input(s): AMMONIA in the last 168 hours. Coagulation Profile: No results for input(s): INR, PROTIME in the last 168 hours. Cardiac Enzymes: No results for input(s): CKTOTAL, CKMB, CKMBINDEX, TROPONINI in the last 168 hours. BNP (last 3 results) No results for input(s): PROBNP in the last 8760 hours. HbA1C: No results for input(s): HGBA1C in the last 72 hours. CBG: No results for input(s): GLUCAP in the last 168 hours. Lipid Profile: No results for input(s): CHOL, HDL, LDLCALC, TRIG, CHOLHDL, LDLDIRECT in the last 72 hours. Thyroid Function Tests: No results for input(s): TSH, T4TOTAL, FREET4, T3FREE, THYROIDAB in the last 72 hours. Anemia Panel: No results for input(s): VITAMINB12, FOLATE, FERRITIN, TIBC, IRON, RETICCTPCT in the last 72 hours. Urine  analysis:    Component Value Date/Time   COLORURINE COLORLESS (A) 10/24/2016 1858   APPEARANCEUR CLEAR 10/24/2016 1858   LABSPEC 1.006 10/24/2016 1858   PHURINE 5.0 10/24/2016 1858   GLUCOSEU NEGATIVE 10/24/2016 1858   HGBUR NEGATIVE 10/24/2016 1858   BILIRUBINUR NEGATIVE 10/24/2016 1858   KETONESUR NEGATIVE 10/24/2016 1858   PROTEINUR NEGATIVE 10/24/2016 1858   UROBILINOGEN 0.2 02/07/2013 1859   NITRITE NEGATIVE 10/24/2016 1858   LEUKOCYTESUR NEGATIVE 10/24/2016 1858   Sepsis Labs: @LABRCNTIP (procalcitonin:4,lacticidven:4)  )No results found for this or any previous visit (from the past 240 hour(s)).   Invalid input(s): PROCALCITONIN, LACTICACIDVEN   Radiology Studies: Dg Chest 2 View  Result  Date: 10/24/2016 CLINICAL DATA:  Dehydration in cough. EXAM: CHEST  2 VIEW COMPARISON:  09/28/2016 FINDINGS: Chronic cardiomegaly. Negative mediastinal contours. Remote granulomatous disease affecting the right mediastinum, hilum, and lung. There is no edema, consolidation, effusion, or pneumothorax. Postoperative right axilla. Cholecystectomy. IMPRESSION: No evidence of acute cardiopulmonary disease. Electronically Signed   By: Monte Fantasia M.D.   On: 10/24/2016 19:57        Scheduled Meds: . dextromethorphan-guaiFENesin  1 tablet Oral BID  . docusate sodium  100 mg Oral BID  . fluticasone  2 spray Each Nare Daily  . folic acid  1 mg Oral Daily  . loratadine  10 mg Oral Daily  . metoprolol succinate  50 mg Oral Daily  . pantoprazole  40 mg Oral BID  . pravastatin  40 mg Oral q1800  . rivaroxaban  20 mg Oral Q supper   Continuous Infusions:   LOS: 0 days    Time spent: 35 minutes    Arsh Feutz A, MD [facility] Pager [phone]  If 7PM-7AM, please contact night-coverage [email or web address] Password [email or web address] 10/25/2016, 11:28 AM

## 2016-10-25 NOTE — Plan of Care (Signed)
Problem: Tissue Perfusion: Goal: Risk factors for ineffective tissue perfusion will decrease Outcome: Progressing On Xeralto  Problem: Fluid Volume: Goal: Ability to maintain a balanced intake and output will improve Outcome: Progressing On strict I/O

## 2016-10-25 NOTE — ED Notes (Signed)
Paged Admitted and made them aware that family is concerns.

## 2016-10-26 DIAGNOSIS — N183 Chronic kidney disease, stage 3 (moderate): Secondary | ICD-10-CM | POA: Diagnosis not present

## 2016-10-26 DIAGNOSIS — Z90710 Acquired absence of both cervix and uterus: Secondary | ICD-10-CM | POA: Diagnosis not present

## 2016-10-26 DIAGNOSIS — Z923 Personal history of irradiation: Secondary | ICD-10-CM | POA: Diagnosis not present

## 2016-10-26 DIAGNOSIS — Z853 Personal history of malignant neoplasm of breast: Secondary | ICD-10-CM | POA: Diagnosis not present

## 2016-10-26 DIAGNOSIS — N179 Acute kidney failure, unspecified: Secondary | ICD-10-CM | POA: Diagnosis not present

## 2016-10-26 DIAGNOSIS — E860 Dehydration: Secondary | ICD-10-CM | POA: Diagnosis not present

## 2016-10-26 DIAGNOSIS — K219 Gastro-esophageal reflux disease without esophagitis: Secondary | ICD-10-CM | POA: Diagnosis not present

## 2016-10-26 DIAGNOSIS — I5042 Chronic combined systolic (congestive) and diastolic (congestive) heart failure: Secondary | ICD-10-CM | POA: Diagnosis not present

## 2016-10-26 DIAGNOSIS — I82402 Acute embolism and thrombosis of unspecified deep veins of left lower extremity: Secondary | ICD-10-CM | POA: Diagnosis not present

## 2016-10-26 DIAGNOSIS — Z9221 Personal history of antineoplastic chemotherapy: Secondary | ICD-10-CM | POA: Diagnosis not present

## 2016-10-26 DIAGNOSIS — I130 Hypertensive heart and chronic kidney disease with heart failure and stage 1 through stage 4 chronic kidney disease, or unspecified chronic kidney disease: Secondary | ICD-10-CM | POA: Diagnosis not present

## 2016-10-26 DIAGNOSIS — I428 Other cardiomyopathies: Secondary | ICD-10-CM | POA: Diagnosis not present

## 2016-10-26 LAB — BASIC METABOLIC PANEL
Anion gap: 7 (ref 5–15)
BUN: 33 mg/dL — AB (ref 6–20)
CALCIUM: 8.4 mg/dL — AB (ref 8.9–10.3)
CHLORIDE: 111 mmol/L (ref 101–111)
CO2: 21 mmol/L — ABNORMAL LOW (ref 22–32)
CREATININE: 1.13 mg/dL — AB (ref 0.44–1.00)
GFR calc Af Amer: 56 mL/min — ABNORMAL LOW (ref >60)
GFR, EST NON AFRICAN AMERICAN: 48 mL/min — AB (ref >60)
Glucose, Bld: 96 mg/dL (ref 65–99)
Potassium: 3.5 mmol/L (ref 3.5–5.1)
SODIUM: 139 mmol/L (ref 135–145)

## 2016-10-26 MED ORDER — RIVAROXABAN 20 MG PO TABS: 20.0000 mg | ORAL_TABLET | Freq: Every day | ORAL | 2 refills | Status: DC

## 2016-10-26 NOTE — Discharge Summary (Signed)
Physician Discharge Summary  Tara [name] [id number] DOB: [birth year] DOA: 10/24/2016  PCP: Tara Nakayama, MD  Admit date: 10/24/2016 Discharge date: 10/26/2016  Admitted From: Home Disposition: Home  Recommendations for Outpatient Follow-up:  1. Follow up with PCP in 1-2 weeks 2. Please obtain BMP/CBC in one week  Home Health: NA Equipment/Devices:NA  Discharge Condition: Stable CODE STATUS: Full Code Diet recommendation: Diet Heart Room service appropriate? Yes; Fluid consistency: Thin Diet - low sodium heart healthy  Brief/Interim Summary: Tara [name] a 71 y.o.femalewith medical history significant of HFrEF with EF of 20-25%, HLD, breast cancer, recent admission for pneumonia where she was also found to have a DVT, presents with worsening renal function. Her daughter provides some of the history. Tara [name] has had a difficult few months with multiple hospitalizations. She was admitted in October for CAP, and then in January for HCAP. During this second hospitalization, she became severely volume overloaded and required IV lasix. After this hospitalization, she was advised to only drink 1.5L of fluid a day. However, Tara [name] was very concerned about this and cut back to almost only 1 cup of water per day (8 oz), while she was still taking her lasix, entresto and spironolactone.    Discharge Diagnoses:  Active Problems:   Nonischemic cardiomyopathy (HCC)   Leukocytosis   Chronic kidney disease (CKD), stage III (moderate)   DVT (deep venous thrombosis) (HCC)   Dehydration   AKI (acute kidney injury) ([place])   ARF (acute renal failure) (HCC)    Dehydration/AKI on Chronic kidney disease (CKD), stage III (moderate) - Baseline creatinine of 1.2, presented with BUN/creatinine of 74/2.39. - BUN/Cr ratio is 30 suggesting acute renal failure secondary to dehydration - UA unremarkable - Nephrotoxic medications including Entresto and diuretics held. - Received gentle IV  fluid hydration, creatinine 1.1 on the day of discharge. - Advised the patient and her husband to drink about 1.2-1.5 L of fluids per day.  Nonischemic cardiomyopathy with chronic combined CHF - She has severe systolic dysfunction (123456) - Strict I/O - Daily weight - She is positive 1.1 L on discharge because of gentle hydration, home medications restarted. - Restart her home medications including Entresto, Lasix and spironolactone.  Leukocytosis - Reviewed Tara [name] note, evaluating for drug cause, TB, rheumatological cause of her elevated WBC with eosinophilia. Follow up outpatient.   DVT (deep venous thrombosis) ([place]) - Started on Xarelto, supposed to transition to 20 mg daily dosing on 1/30, but ran out of medication - Restart xarelto at 20mg  daily.  HLD - Continue statin  Anxiety - Continue home klonipin  Cough, non productive -CXR is normal.   Discharge Instructions  Discharge Instructions    Diet - low sodium heart healthy    Complete by:  As directed    Increase activity slowly    Complete by:  As directed      Allergies as of 10/26/2016      Reactions   Aspirin Other (See Comments)   Reports GI bleed with daily use   Lisinopril Cough   Sudafed [pseudoephedrine Hcl]    Pt reports she had drainage in throat that made her throat hurt.       Medication List    STOP taking these medications   cefpodoxime 200 MG tablet Commonly known as:  VANTIN     TAKE these medications   azelastine 0.1 % nasal spray Commonly known as:  ASTELIN Place 2 sprays into both nostrils 2 (two) times daily. Use in  each nostril as directed   clonazePAM 0.5 MG tablet Commonly known as:  KLONOPIN TAKE (1) TABLET BY MOUTH AT BEDTIME.   docusate sodium 100 MG capsule Commonly known as:  COLACE Take 100 mg by mouth 2 (two) times daily.   fexofenadine 180 MG tablet Commonly known as:  ALLEGRA Take 180 mg by mouth daily as needed for allergies.   fluticasone 50  MCG/ACT nasal spray Commonly known as:  FLONASE Place 2 sprays into both nostrils daily.   folic acid 1 MG tablet Commonly known as:  FOLVITE Take 1 tablet (1 mg total) by mouth daily.   furosemide 40 MG tablet Commonly known as:  LASIX Take 1 tablet (40 mg total) by mouth 2 (two) times daily. For 1 week then CHANGE to 40mg  daily What changed:  when to take this  additional instructions   lovastatin 40 MG tablet Commonly known as:  MEVACOR Take 1 tablet (40 mg total) by mouth at bedtime.   metoprolol succinate 50 MG 24 hr tablet Commonly known as:  TOPROL-XL Take 1 tablet (50 mg total) by mouth daily. Take with or immediately following a meal.   pantoprazole 40 MG tablet Commonly known as:  PROTONIX Take 1 tablet (40 mg total) by mouth 2 (two) times daily.   rivaroxaban 20 MG Tabs tablet Commonly known as:  XARELTO Take 1 tablet (20 mg total) by mouth daily with supper. Starting on 1/31 What changed:  Another medication with the same name was removed. Continue taking this medication, and follow the directions you see here.   sacubitril-valsartan 24-26 MG Commonly known as:  ENTRESTO Take 1 tablet by mouth 2 (two) times daily.   sertraline 50 MG tablet Commonly known as:  ZOLOFT Take 1 tablet (50 mg total) by mouth daily.   spironolactone 25 MG tablet Commonly known as:  ALDACTONE Take 1 tablet (25 mg total) by mouth daily.   zolpidem 10 MG tablet Commonly known as:  AMBIEN Take 1 tablet (10 mg total) by mouth at bedtime as needed for sleep.      Follow-up Information    Tara Nakayama, MD Follow up in 1 week(s).   Specialty:  Family Medicine Contact information: [address] [place] [address] [phone]          Allergies  Allergen Reactions  . Aspirin Other (See Comments)    Reports GI bleed with daily use  . Lisinopril Cough  . Sudafed [Pseudoephedrine Hcl]     Pt reports she had drainage in throat that made her throat hurt.      Consultations:  None   Procedures (Echo, Carotid, EGD, Colonoscopy, ERCP)   Radiological studies: Dg Chest 2 View  Result Date: 10/24/2016 CLINICAL DATA:  Dehydration in cough. EXAM: CHEST  2 VIEW COMPARISON:  09/28/2016 FINDINGS: Chronic cardiomegaly. Negative mediastinal contours. Remote granulomatous disease affecting the right mediastinum, hilum, and lung. There is no edema, consolidation, effusion, or pneumothorax. Postoperative right axilla. Cholecystectomy. IMPRESSION: No evidence of acute cardiopulmonary disease. Electronically Signed   By: Monte Fantasia M.D.   On: 10/24/2016 19:57   Dg Chest 2 View  Result Date: 09/28/2016 CLINICAL DATA:  71 year old with acute onset of shortness of breath and generalized weakness that began yesterday, now associated with slurred speech. Personal history of right breast cancer post lumpectomy in 2008. EXAM: CHEST  2 VIEW COMPARISON:  08/25/2016, 06/22/2016 and earlier. FINDINGS: AP semi-erect and lateral images were obtained. Cardiac silhouette mildly to moderately enlarged, unchanged  when allowing for differences in technique. New focal opacity in the superior segment right lower lobe since the examination 1 month ago. Lungs otherwise clear. No pleural effusions. Normal pulmonary vascularity. Calcified right paratracheal and right hilar lymph nodes again noted. Visualized bony thorax intact. IMPRESSION: 1. Acute pneumonia suspected involving the superior segment of the right lower lobe. 2. Stable cardiomegaly without pulmonary edema. 3. Calcified right paratracheal and right hilar lymph nodes indicating old granulomatous disease. Electronically Signed   By: Evangeline Dakin M.D.   On: 09/28/2016 18:17   Ct Head Wo Contrast  Result Date: 09/28/2016 CLINICAL DATA:  Slurred speech. History of meningioma of the planum sphenoidale region. EXAM: CT HEAD WITHOUT CONTRAST TECHNIQUE: Contiguous axial images were obtained from the base of the skull through  the vertex without intravenous contrast. COMPARISON:  Brain MRI March 05, 2016 FINDINGS: Brain: The ventricles are normal in size and configuration. The previously noted meningioma in the planum sphenoidale region is again noted, less than optimally seen on noncontrast enhanced study. It currently measures 2.9 x 2.3 x 1.8 cm, slightly larger than on prior study. There is no surrounding edema, and this meningioma does not cause significant mass effect. There is no other mass evident. There is no hemorrhage, extra-axial fluid collection, or midline shift. Mild basal ganglia calcification bilaterally is felt to be physiologic in this age group. No evident acute infarct. Vascular: There is no vascular calcification evident. No hyperdense vessel. Skull: The bony calvarium appears intact. Sinuses/Orbits: Visualized paranasal sinuses are clear except for modest mucosal thickening in the right sphenoid sinus region. Frontal sinuses are hypoplastic. Visualized orbits appear symmetric bilaterally. Other: Mastoid air cells are clear. There is debris in both external auditory canals. IMPRESSION: Planum sphenoidale region meningioma again noted, modestly increased in size from approximately 10 years prior. No surrounding edema. No new mass evident. No hemorrhage, extra-axial fluid collection, or midline shift. No evidence suggesting acute infarct. Debris in each external auditory canal, likely cerumen. Minimal right sphenoid sinus disease. Electronically Signed   By: Lowella Grip III M.D.   On: 09/28/2016 17:34   Ct Angio Chest Pe W Or Wo Contrast  Result Date: 09/29/2016 CLINICAL DATA:  Shortness of breath. EXAM: CT ANGIOGRAPHY CHEST WITH CONTRAST TECHNIQUE: Multidetector CT imaging of the chest was performed using the standard protocol during bolus administration of intravenous contrast. Multiplanar CT image reconstructions and MIPs were obtained to evaluate the vascular anatomy. CONTRAST:  80 cc Isovue 370 COMPARISON:   None. FINDINGS: Examination is limited by obesity and by breathing motion artifact. Chest wall: No breast masses, supraclavicular or axillary lymphadenopathy. Cardiovascular: The heart is enlarged. No pericardial effusion. There is some contrast in the left atrium and faint contrast on left ventricle. I do not see any contrast in the aorta worn the pulmonary veins. Patient may have had a test bolus. Significant reflux down the IVC and into the hepatic veins. There is also reflux back into the azygos vein. The pulmonary arterial tree is fairly well opacified. No filling defects to suggest pulmonary embolism. Mediastinum/Nodes: No mediastinal or hilar mass or lymphadenopathy. Scattered lymph nodes are noted. Diffuse esophageal wall thickening could be due to esophagitis or collagen vascular disease. Lungs/Pleura: Limited examination due to breathing motion artifact. A calcified granuloma is noted in the right lower lobe. Patchy peripheral infiltrate in the right lower lobe corresponding with the recent chest x-ray. Is also peribronchial thickening and patchy airspace opacity in the left lower lobe. Date small right pleural effusion is  noted. No left pleural effusion. Upper Abdomen: No significant upper abdominal findings. Musculoskeletal: No significant bony findings. Review of the MIP images confirms the above findings. IMPRESSION: 1. No CT findings for pulmonary embolism. 2. Normal caliber thoracic aorta. 3. Significant reflux of contrast down the IVC likely due to tricuspid regurgitation. 4. Diffusely thickened esophageal wall could be due to esophagitis or collagen vascular disease. Recommend correlation with recent endoscopy. 5. Bibasilar infiltrates and right pleural effusion. Electronically Signed   By: Marijo Sanes M.D.   On: 09/29/2016 18:19   Mr 3d Recon At Scanner  Result Date: 10/02/2016 CLINICAL DATA:  Generalized weakness and shortness of breath. Reflux. Pneumonia. Chronic kidney disease. Elevated  liver function tests. Heart failure. EXAM: MRI ABDOMEN WITHOUT AND WITH CONTRAST (INCLUDING MRCP) TECHNIQUE: Multiplanar multisequence MR imaging of the abdomen was performed both before and after the administration of intravenous contrast. Heavily T2-weighted images of the biliary and pancreatic ducts were obtained, and three-dimensional MRCP images were rendered by post processing. CONTRAST:  39mL MULTIHANCE GADOBENATE DIMEGLUMINE 529 MG/ML IV SOLN COMPARISON:  06/18/2016 FINDINGS: Despite efforts by the technologist and patient, motion artifact is present on today's exam and could not be eliminated. This reduces exam sensitivity and specificity. Lower chest: Right pleural effusion with 1.9 cm opacity peripherally in the right lower lobe, favor pneumonia over malignancy. Moderate cardiomegaly. Circumferential wall thickening of the distal esophagus. Hepatobiliary: Several tiny hepatic cysts including a 4 mm cyst peripherally in the right hepatic lobe on image 53/2103. Common bile duct up to 9 mm. Cholecystectomy. No focal liver lesion. Mild periportal edema. MRCP images are limited by considerable motion artifact, no obvious filling defect in the biliary tree. Pancreas:  Trace fluid in the lesser sac.  Pancreas unremarkable. Spleen:  Unremarkable Adrenals/Urinary Tract: Mild scarring in the left kidney upper pole. Bosniak category 1 cyst of the right kidney upper pole. Adrenal glands normal. Perirenal stranding bilaterally, likely chronic. Stomach/Bowel: Suspected mild wall thickening in the stomach, notably in the gastric antrum. Questionable duodenal wall thickening. No ulcer is identified. Vascular/Lymphatic:  Unremarkable Other: Bilateral flank edema. On the right side this extends along the serratus anterior muscle. Musculoskeletal: Unremarkable IMPRESSION: 1. Mild periportal edema, nonspecific. No worrisome focal liver lesion. 2. Extrahepatic biliary tree measures 9 mm in diameter, no definite filling defect  although sensitivity reduced by motion artifact. The mild dilation of the extrahepatic biliary tree could be due to physiologic response to cholecystectomy. 3. Trace amount of fluid in the lesser sac, significance uncertain. No overt upper abdominal ascites. 4. Subcutaneous flank edema bilaterally. 5. Moderate cardiomegaly with small right pleural effusion. 6. 1.9 cm airspace opacity in the right lower lobe as shown on recent chest CT, probably pneumonia. 7. Wall thickening in the distal esophagus, stomach antrum, and duodenum, suspicious for esophagitis, antritis, and duodenitis. No definite ulcer. Electronically Signed   By: Van Clines M.D.   On: 10/02/2016 11:36   Mr Abdomen Mrcp Moise Boring Contast  Result Date: 10/02/2016 CLINICAL DATA:  Generalized weakness and shortness of breath. Reflux. Pneumonia. Chronic kidney disease. Elevated liver function tests. Heart failure. EXAM: MRI ABDOMEN WITHOUT AND WITH CONTRAST (INCLUDING MRCP) TECHNIQUE: Multiplanar multisequence MR imaging of the abdomen was performed both before and after the administration of intravenous contrast. Heavily T2-weighted images of the biliary and pancreatic ducts were obtained, and three-dimensional MRCP images were rendered by post processing. CONTRAST:  41mL MULTIHANCE GADOBENATE DIMEGLUMINE 529 MG/ML IV SOLN COMPARISON:  06/18/2016 FINDINGS: Despite efforts by the technologist and  patient, motion artifact is present on today's exam and could not be eliminated. This reduces exam sensitivity and specificity. Lower chest: Right pleural effusion with 1.9 cm opacity peripherally in the right lower lobe, favor pneumonia over malignancy. Moderate cardiomegaly. Circumferential wall thickening of the distal esophagus. Hepatobiliary: Several tiny hepatic cysts including a 4 mm cyst peripherally in the right hepatic lobe on image 53/2103. Common bile duct up to 9 mm. Cholecystectomy. No focal liver lesion. Mild periportal edema. MRCP images are  limited by considerable motion artifact, no obvious filling defect in the biliary tree. Pancreas:  Trace fluid in the lesser sac.  Pancreas unremarkable. Spleen:  Unremarkable Adrenals/Urinary Tract: Mild scarring in the left kidney upper pole. Bosniak category 1 cyst of the right kidney upper pole. Adrenal glands normal. Perirenal stranding bilaterally, likely chronic. Stomach/Bowel: Suspected mild wall thickening in the stomach, notably in the gastric antrum. Questionable duodenal wall thickening. No ulcer is identified. Vascular/Lymphatic:  Unremarkable Other: Bilateral flank edema. On the right side this extends along the serratus anterior muscle. Musculoskeletal: Unremarkable IMPRESSION: 1. Mild periportal edema, nonspecific. No worrisome focal liver lesion. 2. Extrahepatic biliary tree measures 9 mm in diameter, no definite filling defect although sensitivity reduced by motion artifact. The mild dilation of the extrahepatic biliary tree could be due to physiologic response to cholecystectomy. 3. Trace amount of fluid in the lesser sac, significance uncertain. No overt upper abdominal ascites. 4. Subcutaneous flank edema bilaterally. 5. Moderate cardiomegaly with small right pleural effusion. 6. 1.9 cm airspace opacity in the right lower lobe as shown on recent chest CT, probably pneumonia. 7. Wall thickening in the distal esophagus, stomach antrum, and duodenum, suspicious for esophagitis, antritis, and duodenitis. No definite ulcer. Electronically Signed   By: Van Clines M.D.   On: 10/02/2016 11:36     Subjective:  Discharge Exam: Vitals:   10/25/16 1300 10/25/16 2047 10/26/16 0500 10/26/16 0536  BP: (!) 110/58 (!) 105/55  (!) 99/53  Pulse: (!) 56 64  62  Resp: 18 16  18   Temp: 98.2 F (36.8 C) 98.7 F (37.1 C)  98.1 F (36.7 C)  TempSrc: Oral Oral  Oral  SpO2: 98% 97%  98%  Weight:   72.4 kg (159 lb 9.6 oz)    General: Pt is alert, awake, not in acute distress Cardiovascular:  RRR, S1/S2 +, no rubs, no gallops Respiratory: CTA bilaterally, no wheezing, no rhonchi Abdominal: Soft, NT, ND, bowel sounds + Extremities: no edema, no cyanosis   The results of significant diagnostics from this hospitalization (including imaging, microbiology, ancillary and laboratory) are listed below for reference.    Microbiology: No results found for this or any previous visit (from the past 240 hour(s)).   Labs: BNP (last 3 results)  Recent Labs  03/16/16 1236 04/23/16 1554 09/28/16 1605  BNP 227.0* 80.7 [id number]*   Basic Metabolic Panel:  Recent Labs Lab 10/20/16 1015 10/22/16 0800 10/24/16 1909 10/24/16 2110 10/25/16 0515 10/26/16 0257  NA 142  --  134*  --  138 139  K 4.6  --  5.9* 4.6 3.8 3.5  CL 102  --  103  --  113* 111  CO2 19  --  20*  --  18* 21*  GLUCOSE 102*  --  114*  --  101* 96  BUN 60*  --  74*  --  54* 33*  CREATININE 1.94* 2.57* 2.39*  --  1.61* 1.13*  CALCIUM 9.7  --  9.0  --  8.3* 8.4*  Liver Function Tests:  Recent Labs Lab 10/20/16 1015 10/24/16 1909 10/25/16 0515  AST  --  43* 20  ALT  --  28 17  ALKPHOS  --  97 81  BILITOT  --  0.7 0.5  PROT 8.2 8.4* 7.1  ALBUMIN  --  3.7 3.0*   No results for input(s): LIPASE, AMYLASE in the last 168 hours. No results for input(s): AMMONIA in the last 168 hours. CBC:  Recent Labs Lab 10/24/16 1909 10/25/16 0515  WBC 10.8* 10.3  NEUTROABS 6.6  --   HGB 13.6 12.0  HCT 42.2 37.1  MCV 81.2 80.8  PLT 170 139*   Cardiac Enzymes: No results for input(s): CKTOTAL, CKMB, CKMBINDEX, TROPONINI in the last 168 hours. BNP: Invalid input(s): POCBNP CBG: No results for input(s): GLUCAP in the last 168 hours. D-Dimer No results for input(s): DDIMER in the last 72 hours. Hgb A1c No results for input(s): HGBA1C in the last 72 hours. Lipid Profile No results for input(s): CHOL, HDL, LDLCALC, TRIG, CHOLHDL, LDLDIRECT in the last 72 hours. Thyroid function studies No results for input(s):  TSH, T4TOTAL, T3FREE, THYROIDAB in the last 72 hours.  Invalid input(s): FREET3 Anemia work up No results for input(s): VITAMINB12, FOLATE, FERRITIN, TIBC, IRON, RETICCTPCT in the last 72 hours. Urinalysis    Component Value Date/Time   COLORURINE COLORLESS (A) 10/24/2016 1858   APPEARANCEUR CLEAR 10/24/2016 1858   LABSPEC 1.006 10/24/2016 1858   PHURINE 5.0 10/24/2016 1858   GLUCOSEU NEGATIVE 10/24/2016 1858   HGBUR NEGATIVE 10/24/2016 1858   BILIRUBINUR NEGATIVE 10/24/2016 1858   KETONESUR NEGATIVE 10/24/2016 1858   PROTEINUR NEGATIVE 10/24/2016 1858   UROBILINOGEN 0.2 02/07/2013 1859   NITRITE NEGATIVE 10/24/2016 1858   LEUKOCYTESUR NEGATIVE 10/24/2016 1858   Sepsis Labs Invalid input(s): PROCALCITONIN,  WBC,  LACTICIDVEN Microbiology No results found for this or any previous visit (from the past 240 hour(s)).   Time coordinating discharge: Over 30 minutes  SIGNED:   Birdie Hopes, MD  [facility] 10/26/2016, 10:32 AM Pager   If 7PM-7AM, please contact night-coverage [email or web address] Password [email or web address]

## 2016-10-28 ENCOUNTER — Telehealth: Payer: Self-pay

## 2016-10-28 NOTE — Telephone Encounter (Signed)
Transition Care Management Follow-up Telephone Call   Date discharged? 10/26/2016   How have you been since you were released from the hospital? Much better   Do you understand why you were in the hospital? yes   Do you understand the discharge instructions? yes   Where were you discharged to? Home   Items Reviewed:  Medications reviewed: yes  Allergies reviewed: yes  Dietary changes reviewed: yes  Referrals reviewed: yes    Any transportation issues/concerns?: no   Any patient concerns? no   Confirmed importance and date/time of follow-up visits scheduled yes  Provider Appointment booked with Dr. Moshe Cipro on 11/04/2016 at 2:00 pm  Confirmed with patient if condition begins to worsen call PCP or go to the ER.  Patient was given the office number and encouraged to call back with question or concerns.  : yes

## 2016-10-29 NOTE — Telephone Encounter (Signed)
Noted  

## 2016-10-30 ENCOUNTER — Telehealth: Payer: Self-pay

## 2016-10-30 NOTE — Telephone Encounter (Signed)
I already personally called in a refill to [facility] this past weekend, pls call and verify with the pharmacy that they deliver the medication I called in , she needs  To stay on the medication, vERY iMPORTANT, then let pt know

## 2016-10-30 NOTE — Telephone Encounter (Signed)
Patient aware that medication is available for pickup at pharmacy.

## 2016-11-04 ENCOUNTER — Ambulatory Visit (INDEPENDENT_AMBULATORY_CARE_PROVIDER_SITE_OTHER): Payer: Medicare Other | Admitting: Family Medicine

## 2016-11-04 ENCOUNTER — Encounter: Payer: Self-pay | Admitting: Family Medicine

## 2016-11-04 VITALS — BP 110/72 | HR 63 | Resp 16 | Ht <= 58 in | Wt 158.0 lb

## 2016-11-04 DIAGNOSIS — I471 Supraventricular tachycardia: Secondary | ICD-10-CM | POA: Diagnosis not present

## 2016-11-04 DIAGNOSIS — R938 Abnormal findings on diagnostic imaging of other specified body structures: Secondary | ICD-10-CM

## 2016-11-04 DIAGNOSIS — D539 Nutritional anemia, unspecified: Secondary | ICD-10-CM | POA: Diagnosis not present

## 2016-11-04 DIAGNOSIS — R9389 Abnormal findings on diagnostic imaging of other specified body structures: Secondary | ICD-10-CM

## 2016-11-04 DIAGNOSIS — I428 Other cardiomyopathies: Secondary | ICD-10-CM | POA: Diagnosis not present

## 2016-11-04 DIAGNOSIS — D721 Eosinophilia: Secondary | ICD-10-CM

## 2016-11-04 DIAGNOSIS — D7219 Other eosinophilia: Secondary | ICD-10-CM

## 2016-11-04 DIAGNOSIS — Z09 Encounter for follow-up examination after completed treatment for conditions other than malignant neoplasm: Secondary | ICD-10-CM | POA: Diagnosis not present

## 2016-11-04 MED ORDER — METOPROLOL SUCCINATE ER 50 MG PO TB24: 50.0000 mg | ORAL_TABLET | Freq: Every day | ORAL | 4 refills | Status: DC

## 2016-11-04 MED ORDER — PROMETHAZINE-DM 6.25-15 MG/5ML PO SYRP: ORAL_SOLUTION | ORAL | 0 refills | Status: DC

## 2016-11-04 MED ORDER — RIVAROXABAN 20 MG PO TABS: 20.0000 mg | ORAL_TABLET | Freq: Every day | ORAL | 1 refills | Status: DC

## 2016-11-04 NOTE — Patient Instructions (Signed)
CPE in March as before  Call if you ned me sooner  Lab today CBC and chem7 and EGFR (not stat)    You are beimng referred to your cardiologist and lung specialist  Need to stay on daily folic acid, also xarelto one daily for 4 months total  Thankful you are better

## 2016-11-05 LAB — BASIC METABOLIC PANEL WITH GFR
BUN: 26 mg/dL — ABNORMAL HIGH (ref 7–25)
CO2: 23 mmol/L (ref 20–31)
Calcium: 9 mg/dL (ref 8.6–10.4)
Chloride: 99 mmol/L (ref 98–110)
Creat: 1.47 mg/dL — ABNORMAL HIGH (ref 0.60–0.93)
GFR, Est African American: 41 mL/min — ABNORMAL LOW (ref >=60)
GFR, Est Non African American: 36 mL/min — ABNORMAL LOW (ref >=60)
Glucose, Bld: 79 mg/dL (ref 65–99)
Potassium: 4.8 mmol/L (ref 3.5–5.3)
Sodium: 133 mmol/L — ABNORMAL LOW (ref 135–146)

## 2016-11-05 LAB — CBC
HCT: 40.7 % (ref 35.0–45.0)
Hemoglobin: 12.9 g/dL (ref 11.7–15.5)
MCH: 26 pg — ABNORMAL LOW (ref 27.0–33.0)
MCHC: 31.7 g/dL — ABNORMAL LOW (ref 32.0–36.0)
MCV: 82.1 fL (ref 80.0–100.0)
MPV: 9.8 fL (ref 7.5–12.5)
Platelets: 220 10*3/uL (ref 140–400)
RBC: 4.96 MIL/uL (ref 3.80–5.10)
RDW: 17.6 % — ABNORMAL HIGH (ref 11.0–15.0)
WBC: 9.7 10*3/uL (ref 3.8–10.8)

## 2016-11-07 NOTE — Assessment & Plan Note (Signed)
Scan of 09/2016 shows bibasilar infiltrates and pleural effusion, refer to Dr  Melvyn Novas for re eval andmaangement

## 2016-11-07 NOTE — Progress Notes (Signed)
   Tara [name]     MRN: [id number]      DOB: 06-Apr-1946   HPI [name]  Patient in for follow up of recent hospitalization.Hospitalized from 2/2 to 10/26/2016.with pre renal azotemia. Following gentle rehydration she improved and is here for follow up. She recently had a complete heme/onc workup for persistent eosinophilia and the conclusion is that this is not a malignant process, and may well be related to some of her medication. Suggestion is to start with the ARB and work our way down Pt will need cardiology to address this as she has both a cardiomyopathy as well as palpitations Pt also had a DVT on 09/29/2016 , and is currently committed to 4 months of xarelto She had a chest scan at that admission in Jan which showed bibasilar infiltrates and a right pleural effusion, will need f/u with pulmonary   Discharge summary, and laboratory and radiology data are reviewed, and any questions or concerns about recent hospitalization are discussed. Specific issues requiring follow up are specifically addressed.   ROS Denies recent fever or chills. Denies sinus pressure, nasal congestion, ear pain or sore throat. Denies chest congestion, productive cough or wheezing. Denies chest pains, palpitations and leg swelling Denies abdominal pain, nausea, vomiting,diarrhea or constipation.   Denies dysuria, frequency, hesitancy or incontinence. Denies joint pain, swelling and limitation in mobility. Denies headaches, seizures, numbness, or tingling. Denies depression, anxiety or insomnia. Denies skin break down or rash.   PE  BP 110/72   Pulse 63   Resp 16   Ht 4\' 8"  (1.422 m)   Wt 158 lb (71.7 kg)   SpO2 94%   BMI 35.42 kg/m   Patient alert and oriented and in no cardiopulmonary distress.  HEENT: No facial asymmetry, EOMI,   oropharynx pink and moist.  Neck supple no JVD, no mass.  Chest: decreased though adequate air enry  CVS: S1, S2 no murmurs, no S3.Regular rate.  ABD: Soft non  tender.   Ext: left lower extremity edema  MS: Adequate ROM spine, shoulders, hips and knees.  Skin: Intact, no ulcerations or rash noted.  Psych: Good eye contact, normal affect. Memory intact not anxious or depressed appearing.  CNS: CN 2-12 intact, power,  normal throughout.no focal deficits noted.   [facility] discharge follow-up Discharge summary for 2 most recent hospitalizations reviewed, both since jan 20918, and pertinent follow up is addressed Needs referral to cardiology and also to pulmonary. Rept lab work for pre renal azotemia , reason for recent hospitalization is much improved  Leukocytosis Evaluated in 10/2016 by  Heme / onc felt to be medication related, focus is on antihypertensives per hematology , will defer to cardiology as she has both cardiomyoopathy and palptations  Abnormal CT scan, chest Scan of 09/2016 shows bibasilar infiltrates and pleural effusion, refer to Dr  Melvyn Novas for re eval andmaangement  DVT (deep venous thrombosis) ([place]) committed to 4 month anticoagulation

## 2016-11-07 NOTE — Assessment & Plan Note (Signed)
Discharge summary for 2 most recent hospitalizations reviewed, both since jan 20918, and pertinent follow up is addressed Needs referral to cardiology and also to pulmonary. Rept lab work for pre renal azotemia , reason for recent hospitalization is much improved

## 2016-11-07 NOTE — Assessment & Plan Note (Signed)
Evaluated in 10/2016 by  Heme / onc felt to be medication related, focus is on antihypertensives per hematology , will defer to cardiology as she has both cardiomyoopathy and palptations

## 2016-11-07 NOTE — Assessment & Plan Note (Signed)
committed to 4 month anticoagulation

## 2016-11-24 ENCOUNTER — Encounter: Payer: Self-pay | Admitting: Family Medicine

## 2016-11-24 ENCOUNTER — Ambulatory Visit (INDEPENDENT_AMBULATORY_CARE_PROVIDER_SITE_OTHER): Payer: Medicare Other | Admitting: Family Medicine

## 2016-11-24 VITALS — BP 114/78 | HR 72 | Resp 15 | Ht <= 58 in | Wt 157.0 lb

## 2016-11-24 DIAGNOSIS — R053 Chronic cough: Secondary | ICD-10-CM

## 2016-11-24 DIAGNOSIS — H9193 Unspecified hearing loss, bilateral: Secondary | ICD-10-CM | POA: Diagnosis not present

## 2016-11-24 DIAGNOSIS — E8881 Metabolic syndrome: Secondary | ICD-10-CM

## 2016-11-24 DIAGNOSIS — D721 Eosinophilia, unspecified: Secondary | ICD-10-CM

## 2016-11-24 DIAGNOSIS — H6123 Impacted cerumen, bilateral: Secondary | ICD-10-CM | POA: Diagnosis not present

## 2016-11-24 DIAGNOSIS — R05 Cough: Secondary | ICD-10-CM

## 2016-11-24 DIAGNOSIS — I82402 Acute embolism and thrombosis of unspecified deep veins of left lower extremity: Secondary | ICD-10-CM

## 2016-11-24 DIAGNOSIS — J302 Other seasonal allergic rhinitis: Secondary | ICD-10-CM

## 2016-11-24 DIAGNOSIS — E782 Mixed hyperlipidemia: Secondary | ICD-10-CM | POA: Diagnosis not present

## 2016-11-24 DIAGNOSIS — I428 Other cardiomyopathies: Secondary | ICD-10-CM | POA: Diagnosis not present

## 2016-11-24 MED ORDER — AZELASTINE HCL 0.1 % NA SOLN: 2.0000 | Freq: Two times a day (BID) | NASAL | 12 refills | Status: DC

## 2016-11-24 MED ORDER — MONTELUKAST SODIUM 10 MG PO TABS: 10.0000 mg | ORAL_TABLET | Freq: Every day | ORAL | 3 refills | Status: DC

## 2016-11-24 NOTE — Assessment & Plan Note (Addendum)
Spouse reports hearing loss and both ears are impacted , ENT referral

## 2016-11-24 NOTE — Patient Instructions (Addendum)
Annual exam in July, call if you need me sooner.  You need to start taking medication for allergies every day , two medications are sent in Astelin and Singulair. Please call back in 2-3 weeks if you still continue to have excessive sneezing runny nose and head congestion with cough.I will add additional medication as you may need to see an allergist  You are referred for ear irrigation  Continue Xarelto until April as you are  being treated for 4 months for the clot in your leg.  Postpone dental work until you  have stopped the blood thinner.  Please keep appointments coming up with the luing specialist and cardiologist  You need fasting blood work done one week before your next visit in July thank you  Lipid, cmp and TSH fasting 1 week before next appt  Thank you  for choosing [facility]. We consider it a privelige to serve you.  Delivering excellent health care in a caring and  compassionate way is our goal.  Partnering with you,  so that together we can achieve this goal is our strategy.   Please work on good  health habits so that your health will improve. 1. Commitment to daily physical activity for 30 to 60  minutes, if you are able to do this.  2. Commitment to wise food choices. Aim for half of your  food intake to be vegetable and fruit, one quarter starchy foods, and one quarter protein. Try to eat on a regular schedule  3 meals per day, snacking between meals should be limited to vegetables or fruits or small portions of nuts. 64 ounces of water per day is generally recommended, unless you have specific health conditions, like heart failure or kidney failure where you will need to limit fluid intake.  3. Commitment to sufficient and a  good quality of physical and mental rest daily, generally between 6 to 8 hours per day.  WITH PERSISTANCE AND PERSEVERANCE, THE IMPOSSIBLE , BECOMES THE NORM!

## 2016-11-24 NOTE — Assessment & Plan Note (Signed)
Uncontrolled, start daily meds, call in 2 weeks if persists, also sees pulmonary soon

## 2016-11-25 ENCOUNTER — Other Ambulatory Visit: Payer: Self-pay | Admitting: Family Medicine

## 2016-11-28 ENCOUNTER — Encounter: Payer: Self-pay | Admitting: Family Medicine

## 2016-11-28 NOTE — Assessment & Plan Note (Signed)
Upcoming pulmonary re evaluation

## 2016-11-28 NOTE — Assessment & Plan Note (Signed)
Stable , no decompensation currently 

## 2016-11-28 NOTE — Assessment & Plan Note (Signed)
Hematology eval negative for malignancy, feeling is that this may be medication related, suggests looking at heart medications, will defer to cardiology, has upcoming appointment

## 2016-11-28 NOTE — Progress Notes (Signed)
   SYLVIA HELMS     MRN: [id number]      DOB: [birth year]   HPI Ms. Semrad is here with main c/o uncontrolled dry hacking cough, states she gets choked up especially early morning and even has trouble with speech. Denies fever or chills, but c/o nasal congestion, sinus fulllness and clear drainage. C/o hearing loss Denies heartburn  Or reflux symptoms. Was scheduled for physical but is refusing today   ROS  See HPI   Denies chest pains, palpitations and leg swelling Denies abdominal pain, nausea, vomiting,diarrhea or constipation.   Denies dysuria, frequency, hesitancy or incontinence. Denies joint pain, swelling and limitation in mobility. Denies headaches, seizures, numbness, or tingling. Denies uncontrolled  depression, anxiety or insomnia. Denies skin break down or rash.   PE  BP 114/78   Pulse 72   Resp 15   Ht 4\' 8"  (1.422 m)   Wt 157 lb (71.2 kg)   SpO2 95%   BMI 35.20 kg/m   Patient alert and oriented and in no cardiopulmonary distress.  HEENT: No facial asymmetry, EOMI,   oropharynx pink and moist.  Neck supple no JVD, no mass.Nasal mucosa edematous and erythematous, bilateral cerumen impaction and small ear canals  Chest: Clear to auscultation bilaterally.  CVS: S1, S2 no murmurs, no S3.Regular rate.  ABD: Soft non tender.   Ext: No edema  MS: Adequate ROM spine, shoulders, hips and knees.  Skin: Intact, no ulcerations or rash noted.  Psych: Good eye contact, normal affect. Memory intact not anxious or depressed appearing.  CNS: CN 2-12 intact, power,  normal throughout.no focal deficits noted.   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

## 2016-12-01 ENCOUNTER — Ambulatory Visit: Payer: Medicare Other | Admitting: Family Medicine

## 2016-12-02 ENCOUNTER — Encounter: Payer: Self-pay | Admitting: Family Medicine

## 2016-12-02 ENCOUNTER — Ambulatory Visit (INDEPENDENT_AMBULATORY_CARE_PROVIDER_SITE_OTHER): Payer: Medicare Other | Admitting: Family Medicine

## 2016-12-02 VITALS — BP 116/78 | HR 68 | Temp 97.4°F | Resp 16 | Ht <= 58 in | Wt 157.0 lb

## 2016-12-02 DIAGNOSIS — H6123 Impacted cerumen, bilateral: Secondary | ICD-10-CM

## 2016-12-02 MED ORDER — ZOLPIDEM TARTRATE 10 MG PO TABS: 10.0000 mg | ORAL_TABLET | Freq: Every evening | ORAL | 1 refills | Status: DC | PRN

## 2016-12-02 MED ORDER — CLONAZEPAM 0.5 MG PO TABS: ORAL_TABLET | ORAL | 1 refills | Status: DC

## 2016-12-02 MED ORDER — SERTRALINE HCL 50 MG PO TABS: 50.0000 mg | ORAL_TABLET | Freq: Every day | ORAL | 1 refills | Status: DC

## 2016-12-02 NOTE — Patient Instructions (Signed)
Return as needed

## 2016-12-02 NOTE — Progress Notes (Signed)
Here for cerumen impaction Noted by PCP at last visit Here for ear lavage  Bilat cerumen impactions noted on exam Successfully curetted and lavaged clear Patient tolerated well Tms clear and intact at end of procedure  YSN

## 2016-12-02 NOTE — Addendum Note (Signed)
Addended by: Eual Fines on: 12/02/2016 02:54 PM   Modules accepted: Orders

## 2016-12-08 ENCOUNTER — Ambulatory Visit (INDEPENDENT_AMBULATORY_CARE_PROVIDER_SITE_OTHER): Payer: Medicare Other | Admitting: Internal Medicine

## 2016-12-08 ENCOUNTER — Encounter: Payer: Self-pay | Admitting: Internal Medicine

## 2016-12-08 VITALS — BP 110/62 | HR 72 | Ht <= 58 in | Wt 156.4 lb

## 2016-12-08 DIAGNOSIS — R058 Other specified cough: Secondary | ICD-10-CM

## 2016-12-08 DIAGNOSIS — R05 Cough: Secondary | ICD-10-CM

## 2016-12-08 NOTE — Assessment & Plan Note (Signed)
ACEi intolerant  04/2014  - d/c fosfamax 04/09/2016 >>> resolved at f/u ov 12/08/2016 ? 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  Please see AVS for specific  Instructions which are unique to this visit and I personally typed out  which were reviewed in detail in writing with the patient and a copy provided.

## 2016-12-08 NOTE — Patient Instructions (Signed)
No change in medications or need for pulmonary follow up

## 2016-12-08 NOTE — Progress Notes (Signed)
Subjective:   Patient ID: Tara [name], female    DOB: 08-01-1946,    MRN: [id number]    Brief patient profile:  54 yobf never smoker seasonal rhnitis x in 1960's good control with flonase and allegra with h/o ACEi cough better p ACEi d/c'd 04/2014  new cough early 2017 then abruptly worse sob June 2017 and referred to pulmonary clinic 04/09/2016 by Dr Moshe Cipro for evaluation p  ER rx for asthma and improved p rx with zpak/ pred/alb but had evidence of interstitial edema on that evaluation.   History of Present Illness  04/09/2016 1st [facility] office visit/ Tara [name] ? Cough variant asthma/ known chf and ? Cough from ACEi  Chief Complaint  Patient presents with  . Pulmonary Consult     Referred by Dr. [name]. Pt c/o cough and SOB for the past 6 months, worse since June 2017. She states she gets SOB just walking from room to room at home. She has to sleep propped up. Her cough is occ prod with clear sputum.   for the last year more difficulty going across parking lots but much worse since June 2017 was able able to walk up a flight of steps s difficulty from basement to first floor but gained 15 lb over same time a stopped walking regularly with friends x years. cough onset x 6 months Breathing bad since June 2017 to point where sob > ER  03/16/16 but better now and not even usuing saba x weeks. rx fosfamax x 3 years  Better p albuterol x 3 days only  Now to sleep on 2 pillows where previously needed a 3rd pillow  rec Leave off fosfamax for now  Pantoprazole (protonix) 40 mg   Take  30-60 min before first meal of the day and Pepcid ac (famotidine)  20 mg one @  bedtime until return to office - this is the best way to tell whether stomach acid is contributing to your problem.   GERD diet  f/u 6 weeks > ended up admitted 06/19/27 and no f/u pfts doe    12/08/2016  f/u ov/Esme Durkin re: cough  And sob better with rx for chf  Chief Complaint  Patient presents with  . Follow-up    Pt  never had PFTs. Pt denies SOB, cough, CP/tightness. Pt states she was told to come back for f/u by her PCP.   able to lie down on 2 pillows = baseline Doe = MMRC2 = can't walk a nl pace on a flat grade s sob but does fine slow and flat eg shopping    No obvious day to day or daytime variability or assoc excess/ purulent sputum or mucus plugs or hemoptysis or cp or chest tightness, subjective wheeze or overt sinus or hb symptoms. No unusual exp hx or h/o childhood pna/ asthma or knowledge of premature birth.  Sleeping ok without nocturnal  or early am exacerbation  of respiratory  c/o's or need for noct saba. Also denies any obvious fluctuation of symptoms with weather or environmental changes or other aggravating or alleviating factors except as outlined above   Current Medications, Allergies, Complete Past Medical History, Past Surgical History, Family History, and Social History were reviewed in [employer] record.  ROS  The following are not active complaints unless bolded sore throat, dysphagia, dental problems, itching, sneezing,  nasal congestion or excess/ purulent secretions, ear ache,   fever, chills, sweats, unintended wt loss, classically pleuritic or exertional cp,  orthopnea pnd or leg swelling, presyncope, palpitations, abdominal pain, anorexia, nausea, vomiting, diarrhea  or change in bowel or bladder habits, change in stools or urine, dysuria,hematuria,  rash, arthralgias, visual complaints, headache, numbness, weakness or ataxia or problems with walking or coordination,  change in mood/affect or memory.            Objective:   Physical Exam  amb bf nad    12/08/2016       156   04/09/16 188 lb (85.276 kg)  03/17/16 189 lb (85.73 kg)  03/16/16 188 lb (85.276 kg)    Vital signs reviewed - Note on arrival 02 sats  95% on RA     HEENT: nl dentition, turbinates, and oropharynx. Nl external ear canals without cough reflex   NECK :  without  JVD/Nodes/TM/ nl carotid upstrokes bilaterally   LUNGS: no acc muscle use,  Nl contour chest which is clear to A and P bilaterally without cough on insp or exp maneuvers   CV:  RRR  no s3 or murmur or increase in P2,  No significant ankle  edema   ABD:  soft and nontender with nl inspiratory excursion in the supine position. No bruits or organomegaly, bowel sounds nl  MS:  Nl gait/ ext warm without deformities, calf tenderness, cyanosis or clubbing No obvious joint restrictions   SKIN: warm and dry without lesions    NEURO:  alert, approp, nl sensorium with  no motor deficits      I personally reviewed images and agree with radiology impression as follows:  CXR:   10/24/16  No evidence of acute cardiopulmonary disease.      Assessment & Plan:   Outpatient Encounter Prescriptions as of 12/08/2016  Medication Sig  . azelastine (ASTELIN) 0.1 % nasal spray Place 2 sprays into both nostrils 2 (two) times daily. Use in each nostril as directed  . clonazePAM (KLONOPIN) 0.5 MG tablet TAKE (1) TABLET BY MOUTH AT BEDTIME.  Marland Kitchen docusate sodium (COLACE) 100 MG capsule Take 100 mg by mouth 2 (two) times daily.  . folic acid (FOLVITE) 1 MG tablet Take 1 tablet (1 mg total) by mouth daily.  Marland Kitchen lovastatin (MEVACOR) 40 MG tablet Take 1 tablet (40 mg total) by mouth at bedtime.  . metoprolol succinate (TOPROL-XL) 50 MG 24 hr tablet Take 1 tablet (50 mg total) by mouth daily. Take with or immediately following a meal.  . montelukast (SINGULAIR) 10 MG tablet Take 1 tablet (10 mg total) by mouth at bedtime.  . pantoprazole (PROTONIX) 40 MG tablet Take 1 tablet (40 mg total) by mouth 2 (two) times daily.  . promethazine-dextromethorphan (PROMETHAZINE-DM) 6.25-15 MG/5ML syrup One teaspoon at bedtime, as needed, for excessive cough  . rivaroxaban (XARELTO) 20 MG TABS tablet Take 1 tablet (20 mg total) by mouth daily with supper.  . sacubitril-valsartan (ENTRESTO) 24-26 MG Take 1 tablet by mouth 2 (two)  times daily.  . sertraline (ZOLOFT) 50 MG tablet Take 1 tablet (50 mg total) by mouth daily.  Marland Kitchen spironolactone (ALDACTONE) 25 MG tablet Take 1 tablet (25 mg total) by mouth daily.  Marland Kitchen zolpidem (AMBIEN) 10 MG tablet Take 1 tablet (10 mg total) by mouth at bedtime as needed for sleep.  . furosemide (LASIX) 40 MG tablet Take 1 tablet (40 mg total) by mouth 2 (two) times daily. For 1 week then CHANGE to 40mg  daily (Patient not taking: Reported on 12/08/2016)   No facility-administered encounter medications on file as of 12/08/2016.

## 2016-12-15 ENCOUNTER — Ambulatory Visit (INDEPENDENT_AMBULATORY_CARE_PROVIDER_SITE_OTHER): Payer: Medicare Other | Admitting: Internal Medicine

## 2016-12-15 VITALS — BP 112/64 | HR 78 | Ht <= 58 in | Wt 156.5 lb

## 2016-12-15 DIAGNOSIS — I428 Other cardiomyopathies: Secondary | ICD-10-CM

## 2016-12-15 DIAGNOSIS — I471 Supraventricular tachycardia: Secondary | ICD-10-CM | POA: Diagnosis not present

## 2016-12-15 MED ORDER — SPIRONOLACTONE 25 MG PO TABS: 25.0000 mg | ORAL_TABLET | Freq: Every day | ORAL | 6 refills | Status: DC

## 2016-12-15 MED ORDER — SACUBITRIL-VALSARTAN 24-26 MG PO TABS: 1.0000 | ORAL_TABLET | Freq: Two times a day (BID) | ORAL | 6 refills | Status: DC

## 2016-12-15 NOTE — Progress Notes (Signed)
PCP: [name], MD Primary EP:    Dr Rayann Heman  Orpah [name] is a 71 y.o. female who presents today for routine electrophysiology followup.  I have followed her for SVT.  She has had a mildly depressed EF chronically.  More recently, she has had multiple hospitalizations.  She initially had SBO.  She then developed DVT for which she has been placed on xarelto.  She had progressive SOB and was found to have acute decompensated CHF.  Echo reveals marked decline in EF with severe TR.  She has made slow improvement.  She feels that she is near her baseline currently.   Today, she denies symptoms of palpitations, chest pain, shortness of breath,  lower extremity edema, dizziness, presyncope, or syncope.  The patient is otherwise without complaint today.   Past Medical History:  Diagnosis Date  . 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 Anxiety   . Breast cancer [facility]) 2008   right - s/p lumpectomy->chemo, radiation  . Depression   . Dysrhythmia    hx SVT  . GERD (gastroesophageal reflux disease)   . H/O: hysterectomy   . Hiatal hernia   . History of cancer chemotherapy   . History of radiation therapy   . Hyperlipidemia   . Nonischemic cardiomyopathy ([place])   . SVT (supraventricular tachycardia) ([place])    short RP SVT documented 5/14  . Systolic CHF [facility])    Past Surgical History:  Procedure Laterality Date  . ABDOMINAL HYSTERECTOMY  1994   fibroids,   . BIOPSY  06/19/2016   Procedure: BIOPSY;  Surgeon: Daneil Dolin, MD;  Location: [facility] ENDO SUITE;  Service: Endoscopy;;  gastric duodenum  . BREAST SURGERY Right 2008   lumpectomy, cancer  . CHOLECYSTECTOMY  1999  . COLONOSCOPY  2008   Dr. Oneida Alar: multiple polyps. Path not available at time of visit.   Marland Kitchen COLONOSCOPY WITH PROPOFOL N/A 04/25/2014   Dr. Oneida Alar: Multiple tubular adenomas removed. Diverticulosis. Moderate internal hemorrhoids. Next colonoscopy planned for August 2018.  Marland Kitchen ESOPHAGOGASTRODUODENOSCOPY (EGD) WITH PROPOFOL N/A  06/19/2016   Procedure: ESOPHAGOGASTRODUODENOSCOPY (EGD) WITH PROPOFOL;  Surgeon: Daneil Dolin, MD;  Location: [facility] ENDO SUITE;  Service: Endoscopy;  Laterality: N/A;  . POLYPECTOMY N/A 04/25/2014   Procedure: POLYPECTOMY;  Surgeon: Danie Binder, MD;  Location: [facility] ORS;  Service: Endoscopy;  Laterality: N/A;  Ascending and Decending Colon x3 , Transverse colon x2, rectal    ROS- all systems are reviewed and negatives except as per HPI above  Current Outpatient Prescriptions  Medication Sig Dispense Refill  . azelastine (ASTELIN) 0.1 % nasal spray Place 2 sprays into both nostrils 2 (two) times daily. Use in each nostril as directed 30 mL 12  . clonazePAM (KLONOPIN) 0.5 MG tablet TAKE (1) TABLET BY MOUTH AT BEDTIME. 90 tablet 1  . docusate sodium (COLACE) 100 MG capsule Take 100 mg by mouth 2 (two) times daily.    . folic acid (FOLVITE) 1 MG tablet Take 1 tablet (1 mg total) by mouth daily. 100 tablet 3  . lovastatin (MEVACOR) 40 MG tablet Take 1 tablet (40 mg total) by mouth at bedtime. 90 tablet 1  . metoprolol succinate (TOPROL-XL) 50 MG 24 hr tablet Take 1 tablet (50 mg total) by mouth daily. Take with or immediately following a meal. 30 tablet 4  . montelukast (SINGULAIR) 10 MG tablet Take 1 tablet (10 mg total) by mouth at bedtime. 30 tablet 3  . pantoprazole (PROTONIX) 40 MG tablet Take 1 tablet (  40 mg total) by mouth 2 (two) times daily. 180 tablet 0  . rivaroxaban (XARELTO) 20 MG TABS tablet Take 1 tablet (20 mg total) by mouth daily with supper. 30 tablet 1  . sertraline (ZOLOFT) 50 MG tablet Take 1 tablet (50 mg total) by mouth daily. 90 tablet 1  . spironolactone (ALDACTONE) 25 MG tablet Take 1 tablet (25 mg total) by mouth daily. 30 tablet 0  . zolpidem (AMBIEN) 10 MG tablet Take 1 tablet (10 mg total) by mouth at bedtime as needed for sleep. 90 tablet 1   No current facility-administered medications for this visit.     Physical Exam: Vitals:   12/15/16 1613  BP: 112/64    Pulse: 78  SpO2: 93%  Weight: 156 lb 8 oz (71 kg)  Height: 4\' 9"  (1.448 m)    GEN- The patient is overweight appearing, alert and oriented x 3 today.   Head- normocephalic, atraumatic Eyes-  Sclera clear, conjunctiva pink Ears- hearing intact Oropharynx- clear Lungs- Clear to ausculation bilaterally, normal work of breathing Heart- Regular rate and rhythm  GI- soft, NT, ND, + BS Extremities- no clubbing, cyanosis, + dependant edema  ekg today reveals sinus rhythm 81 bpm, PVCs, LVH, nonspecific ST/T changes (chronic lateral ST depression)  Assessment and Plan:  1. Chronic systolic dysfunction Presumed to be nonischemic though she has not had prior cath.  Prior Brantley Fling is reviewed.  She has not been taking her entresto or spironolactone as she ran out of these medicines.  She seems euvolemic today.  I worry however about her markedly worsened echo 1/18.  I will refer her to the advanced CHF clinic at this time for further evaluation and management.  I have suggested RHC/LHC however the patient wishes to discuss with CHF physician first. I have made no changes today. 2 gram sodium diet.  2. SVT Agree with switch of verapamil to beta blockers due to reduced EF She has been stable, without SVT for quite some time.  3. htn Stable No change required today  Refer to advanced CHF clinic Return to see me in 1 year unless problems arise in the interim.  Thompson Grayer MD, [facility] 12/15/2016 4:40 PM

## 2016-12-15 NOTE — Patient Instructions (Signed)
Medication Instructions:  Your physician has recommended you make the following change in your medication:  1) Restart Entresto 24/26 twice daily   Labwork: None ordered   Testing/Procedures: None ordered  Follow-Up:  You have been referred to Dr Kennieth Francois in 6 weeks.  Dr Aundra Dubin saw patient in the hospital     Any Other Special Instructions Will Be Listed Below (If Applicable).     If you need a refill on your cardiac medications before your next appointment, please call your pharmacy.

## 2016-12-24 ENCOUNTER — Telehealth: Payer: Self-pay

## 2016-12-24 NOTE — Telephone Encounter (Signed)
Received call from [place] with [facility] stating that the pts Entresto PA has been approved from 12/23/16 until 12/23/17.

## 2017-01-01 ENCOUNTER — Encounter: Payer: Self-pay | Admitting: Family Medicine

## 2017-01-01 ENCOUNTER — Ambulatory Visit (INDEPENDENT_AMBULATORY_CARE_PROVIDER_SITE_OTHER): Payer: Medicare Other | Admitting: Family Medicine

## 2017-01-01 VITALS — BP 118/70 | HR 78 | Resp 16 | Ht <= 58 in | Wt 153.4 lb

## 2017-01-01 DIAGNOSIS — E7849 Other hyperlipidemia: Secondary | ICD-10-CM

## 2017-01-01 DIAGNOSIS — K219 Gastro-esophageal reflux disease without esophagitis: Secondary | ICD-10-CM

## 2017-01-01 DIAGNOSIS — R112 Nausea with vomiting, unspecified: Secondary | ICD-10-CM

## 2017-01-01 DIAGNOSIS — J209 Acute bronchitis, unspecified: Secondary | ICD-10-CM

## 2017-01-01 DIAGNOSIS — K529 Noninfective gastroenteritis and colitis, unspecified: Secondary | ICD-10-CM

## 2017-01-01 DIAGNOSIS — E784 Other hyperlipidemia: Secondary | ICD-10-CM

## 2017-01-01 DIAGNOSIS — I428 Other cardiomyopathies: Secondary | ICD-10-CM

## 2017-01-01 MED ORDER — ONDANSETRON HCL 4 MG PO TABS: 4.0000 mg | ORAL_TABLET | Freq: Three times a day (TID) | ORAL | 0 refills | Status: DC | PRN

## 2017-01-01 MED ORDER — ONDANSETRON HCL 4 MG/2ML IJ SOLN
4.0000 mg | Freq: Once | INTRAMUSCULAR | Status: AC
  Administered 2017-01-01: 4 mg via INTRAMUSCULAR

## 2017-01-01 MED ORDER — BENZONATATE 100 MG PO CAPS: 100.0000 mg | ORAL_CAPSULE | Freq: Two times a day (BID) | ORAL | 0 refills | Status: DC | PRN

## 2017-01-01 MED ORDER — CEFTRIAXONE SODIUM 500 MG IJ SOLR
500.0000 mg | Freq: Once | INTRAMUSCULAR | Status: AC
  Administered 2017-01-01: 500 mg via INTRAMUSCULAR

## 2017-01-01 MED ORDER — HYDROCOD POLST-CPM POLST ER 10-8 MG/5ML PO SUER: ORAL | 0 refills | Status: DC

## 2017-01-01 MED ORDER — PENICILLIN V POTASSIUM 500 MG PO TABS: 500.0000 mg | ORAL_TABLET | Freq: Three times a day (TID) | ORAL | 0 refills | Status: DC

## 2017-01-01 NOTE — Progress Notes (Signed)
   Tara [name]     MRN: [id number]      DOB: 1946/01/27   HPI Tara [name] is here  Cough, intermittent  fever , chills and sputum x 4 days  2 day h/o vomiting, 3 today and loose stool x 2 days  ROS Denies chest pains, palpitations and leg swelling .   Denies dysuria, frequency, hesitancy or incontinence. Denies joint pain, swelling and limitation in mobility. Denies headaches, seizures, numbness, or tingling. Denies depression, anxiety or insomnia. Denies skin break down or rash.   PE  BP 118/70   Pulse 78   Resp 16   Ht 4\' 9"  (1.448 m)   Wt 153 lb 6.4 oz (69.6 kg)   SpO2 90%   BMI 33.20 kg/m   Patient alert and oriented and in no cardiopulmonary distress.  HEENT: No facial asymmetry, EOMI,   oropharynx pink and moist.  Neck supple no JVD, no mass.No sinus tenderness  Chest: Decreased air entry , scattered crackles , no wheezes.  CVS: S1, S2 no murmurs, no S3.Regular rate.  ABD: Soft generalized superficial tenderness, no guarding or rebound , hyperactive bowel sounds.   Ext: No edema  MS: Adequate ROM spine, shoulders, hips and knees.  Skin: Intact, no ulcerations or rash noted.  Psych: Good eye contact, normal affect. Memory intact not anxious or depressed appearing.  CNS: CN 2-12 intact, power,  normal throughout.no focal deficits noted.   Assessment & Plan  Acute bronchitis Recent sick exposure with spouse, 4 day h/o acute respiratory decompromise, Rocephin IM followed by antibiotic course  Gastroenteritis, acute Not clinically dehydrated Zofran in office followed by OTC imodium and zofran prescribed Reviewed the importance of adequate hydration   GERD Controlled, no change in medication   Nonischemic cardiomyopathy ([place]) Clinically stable and asymptomatic, recent cardiology evaluation, no medication changes  Hyperlipidemia Hyperlipidemia:Low fat diet discussed and encouraged.   Lipid Panel  Lab Results  Component Value Date   CHOL 205 (H)  10/09/2015   HDL 59 10/09/2015   LDLCALC 119 10/09/2015   TRIG 135 10/09/2015   CHOLHDL 3.5 10/09/2015   Updated lab needed at/ before next visit. Not at goal

## 2017-01-01 NOTE — Patient Instructions (Addendum)
f/u in July as before, call if you need me sooner.  You are treated for acute bronchitis, Rocephin, given in the office, penicillin, tessalon perles and cough suppressant syrup prescribed   For acute  Nausea and vomiting, zofran prescribed, use peptobismol for loose stool  HJope you will feel better soon   Acute Bronchitis, Adult Acute bronchitis is when air tubes (bronchi) in the lungs suddenly get swollen. The condition can make it hard to breathe. It can also cause these symptoms:  A cough.  Coughing up clear, yellow, or green mucus.  Wheezing.  Chest congestion.  Shortness of breath.  A fever.  Body aches.  Chills.  A sore throat. Follow these instructions at home: Medicines   Take over-the-counter and prescription medicines only as told by your doctor.  If you were prescribed an antibiotic medicine, take it as told by your doctor. Do not stop taking the antibiotic even if you start to feel better. General instructions   Rest.  Drink enough fluids to keep your pee (urine) clear or pale yellow.  Avoid smoking and secondhand smoke. If you smoke and you need help quitting, ask your doctor. Quitting will help your lungs heal faster.  Use an inhaler, cool mist vaporizer, or humidifier as told by your doctor.  Keep all follow-up visits as told by your doctor. This is important. How is this prevented? To lower your risk of getting this condition again:  Wash your hands often with soap and water. If you cannot use soap and water, use hand sanitizer.  Avoid contact with people who have cold symptoms.  Try not to touch your hands to your mouth, nose, or eyes.  Make sure to get the flu shot every year. Contact a doctor if:  Your symptoms do not get better in 2 weeks. Get help right away if:  You cough up blood.  You have chest pain.  You have very bad shortness of breath.  You become dehydrated.  You faint (pass out) or keep feeling like you are going to  pass out.  You keep throwing up (vomiting).  You have a very bad headache.  Your fever or chills gets worse. This information is not intended to replace advice given to you by your health care provider. Make sure you discuss any questions you have with your health care provider. Document Released: 02/25/2008 Document Revised: 04/16/2016 Document Reviewed: 02/27/2016 Elsevier Interactive Patient Education  2017 [employer].  Viral Gastroenteritis, Adult Viral gastroenteritis is also known as the stomach flu. This condition is caused by certain germs (viruses). These germs can be passed from person to person very easily (are very contagious). This condition can cause sudden watery poop (diarrhea), fever, and throwing up (vomiting). Having watery poop and throwing up can make you feel weak and cause you to get dehydrated. Dehydration can make you tired and thirsty, make you have a dry mouth, and make it so you pee (urinate) less often. Older adults and people with other diseases or a weak defense system (immune system) are at higher risk for dehydration. It is important to replace the fluids that you lose from having watery poop and throwing up. Follow these instructions at home: Follow instructions from your doctor about how to care for yourself at home. Eating and drinking   Follow these instructions as told by your doctor:  Take an oral rehydration solution (ORS). This is a drink that is sold at pharmacies and stores.  Drink clear fluids in small amounts  as you are able, such as:  Water.  Ice chips.  Diluted fruit juice.  Low-calorie sports drinks.  Eat bland, easy-to-digest foods in small amounts as you are able, such as:  Bananas.  Applesauce.  Rice.  Low-fat (lean) meats.  Toast.  Crackers.  Avoid fluids that have a lot of sugar or caffeine in them.  Avoid alcohol.  Avoid spicy or fatty foods. General instructions   Drink enough fluid to keep your pee (urine)  clear or pale yellow.  Wash your hands often. If you cannot use soap and water, use hand sanitizer.  Make sure that all people in your home wash their hands well and often.  Rest at home while you get better.  Take over-the-counter and prescription medicines only as told by your doctor.  Watch your condition for any changes.  Take a warm bath to help with any burning or pain from having watery poop.  Keep all follow-up visits as told by your doctor. This is important. Contact a doctor if:  You cannot keep fluids down.  Your symptoms get worse.  You have new symptoms.  You feel light-headed or dizzy.  You have muscle cramps. Get help right away if:  You have chest pain.  You feel very weak or you pass out (faint).  You see blood in your throw-up.  Your throw-up looks like coffee grounds.  You have bloody or black poop (stools) or poop that look like tar.  You have a very bad headache, a stiff neck, or both.  You have a rash.  You have very bad pain, cramping, or bloating in your belly (abdomen).  You have trouble breathing.  You are breathing very quickly.  Your heart is beating very quickly.  Your skin feels cold and clammy.  You feel confused.  You have pain when you pee.  You have signs of dehydration, such as:  Dark pee, hardly any pee, or no pee.  Cracked lips.  Dry mouth.  Sunken eyes.  Sleepiness.  Weakness. This information is not intended to replace advice given to you by your health care provider. Make sure you discuss any questions you have with your health care provider. Document Released: 02/25/2008 Document Revised: 03/28/2016 Document Reviewed: 05/15/2015 Elsevier Interactive Patient Education  2017 [employer].

## 2017-01-03 ENCOUNTER — Encounter: Payer: Self-pay | Admitting: Family Medicine

## 2017-01-03 DIAGNOSIS — K529 Noninfective gastroenteritis and colitis, unspecified: Secondary | ICD-10-CM | POA: Insufficient documentation

## 2017-01-03 DIAGNOSIS — J209 Acute bronchitis, unspecified: Secondary | ICD-10-CM | POA: Insufficient documentation

## 2017-01-03 NOTE — Assessment & Plan Note (Signed)
Controlled, no change in medication  

## 2017-01-03 NOTE — Assessment & Plan Note (Signed)
Not clinically dehydrated Zofran in office followed by OTC imodium and zofran prescribed Reviewed the importance of adequate hydration

## 2017-01-03 NOTE — Assessment & Plan Note (Signed)
Hyperlipidemia:Low fat diet discussed and encouraged.   Lipid Panel  Lab Results  Component Value Date   CHOL 205 (H) 10/09/2015   HDL 59 10/09/2015   LDLCALC 119 10/09/2015   TRIG 135 10/09/2015   CHOLHDL 3.5 10/09/2015   Updated lab needed at/ before next visit. Not at goal

## 2017-01-03 NOTE — Assessment & Plan Note (Signed)
Clinically stable and asymptomatic, recent cardiology evaluation, no medication changes

## 2017-01-03 NOTE — Assessment & Plan Note (Signed)
Recent sick exposure with spouse, 4 day h/o acute respiratory decompromise, Rocephin IM followed by antibiotic course

## 2017-01-12 NOTE — Telephone Encounter (Signed)
**Note De-Identified  Obfuscation** Fax received confirming approval on the pts [place] PA from [place]. Approval will end on 12/23/17.

## 2017-01-21 ENCOUNTER — Encounter: Payer: Self-pay | Admitting: Gastroenterology

## 2017-01-28 ENCOUNTER — Other Ambulatory Visit: Payer: Self-pay | Admitting: Family Medicine

## 2017-01-28 DIAGNOSIS — Z1231 Encounter for screening mammogram for malignant neoplasm of breast: Secondary | ICD-10-CM

## 2017-02-12 ENCOUNTER — Ambulatory Visit
Admission: RE | Admit: 2017-02-12 | Discharge: 2017-02-12 | Disposition: A | Discharge status: Home / Self Care | Payer: Medicare Other | Source: Ambulatory Visit | Attending: Family Medicine | Admitting: Family Medicine

## 2017-02-12 DIAGNOSIS — Z1231 Encounter for screening mammogram for malignant neoplasm of breast: Secondary | ICD-10-CM

## 2017-02-12 HISTORY — DX: Personal history of irradiation: Z92.3

## 2017-02-13 ENCOUNTER — Telehealth: Payer: Self-pay | Admitting: Family Medicine

## 2017-02-13 NOTE — Telephone Encounter (Signed)
Pt aware of need for additional imaging

## 2017-02-17 ENCOUNTER — Other Ambulatory Visit: Payer: Self-pay | Admitting: Family Medicine

## 2017-02-17 DIAGNOSIS — R928 Other abnormal and inconclusive findings on diagnostic imaging of breast: Secondary | ICD-10-CM

## 2017-02-24 ENCOUNTER — Other Ambulatory Visit: Payer: Self-pay

## 2017-02-26 ENCOUNTER — Other Ambulatory Visit: Payer: Self-pay

## 2017-02-26 ENCOUNTER — Ambulatory Visit
Admission: RE | Admit: 2017-02-26 | Discharge: 2017-02-26 | Disposition: A | Discharge status: Home / Self Care | Payer: Medicare Other | Source: Ambulatory Visit | Attending: Family Medicine | Admitting: Family Medicine

## 2017-02-26 ENCOUNTER — Other Ambulatory Visit: Payer: Self-pay | Admitting: Family Medicine

## 2017-02-26 DIAGNOSIS — R928 Other abnormal and inconclusive findings on diagnostic imaging of breast: Secondary | ICD-10-CM | POA: Diagnosis not present

## 2017-02-26 DIAGNOSIS — R921 Mammographic calcification found on diagnostic imaging of breast: Secondary | ICD-10-CM

## 2017-02-26 MED ORDER — LOVASTATIN 40 MG PO TABS: 40.0000 mg | ORAL_TABLET | Freq: Every day | ORAL | 1 refills | Status: DC

## 2017-03-02 ENCOUNTER — Ambulatory Visit
Admission: RE | Admit: 2017-03-02 | Discharge: 2017-03-02 | Disposition: A | Discharge status: Home / Self Care | Payer: Medicare Other | Source: Ambulatory Visit | Attending: Family Medicine | Admitting: Family Medicine

## 2017-03-02 ENCOUNTER — Other Ambulatory Visit: Payer: Self-pay | Admitting: Family Medicine

## 2017-03-02 DIAGNOSIS — R921 Mammographic calcification found on diagnostic imaging of breast: Secondary | ICD-10-CM

## 2017-03-02 DIAGNOSIS — Z853 Personal history of malignant neoplasm of breast: Secondary | ICD-10-CM | POA: Diagnosis not present

## 2017-03-03 ENCOUNTER — Other Ambulatory Visit: Payer: Self-pay | Admitting: Family Medicine

## 2017-03-06 ENCOUNTER — Other Ambulatory Visit: Payer: Self-pay

## 2017-03-06 ENCOUNTER — Telehealth: Payer: Self-pay | Admitting: Family Medicine

## 2017-03-06 MED ORDER — PANTOPRAZOLE SODIUM 40 MG PO TBEC: 40.0000 mg | DELAYED_RELEASE_TABLET | Freq: Two times a day (BID) | ORAL | 0 refills | Status: DC

## 2017-03-06 NOTE — Telephone Encounter (Signed)
Per msg on voicemail:  Patient is requesting refill of pantoprazole  Left no pharmacy information. Cb# [phone]

## 2017-03-06 NOTE — Telephone Encounter (Signed)
Refill sent.

## 2017-03-06 NOTE — Telephone Encounter (Signed)
Patient is requesting a refill of pantoprazole be sent to [facility] Cb#: [phone]

## 2017-03-10 ENCOUNTER — Telehealth: Payer: Self-pay | Admitting: *Deleted

## 2017-03-10 NOTE — Telephone Encounter (Signed)
April from [facility] order called requesting a refill on lovastatin 40mg  and pantoprazole 40 mg for the patient. Please advise [phone]

## 2017-03-10 NOTE — Telephone Encounter (Signed)
meds were already sent to [facility]. Pt aware.

## 2017-03-11 ENCOUNTER — Telehealth: Payer: Self-pay | Admitting: Nurse Practitioner

## 2017-03-11 NOTE — Telephone Encounter (Signed)
Should be ok. Would try to avoid excess salt on the trip, agree with compression stockings. Walk around at all rest stops.

## 2017-03-11 NOTE — Telephone Encounter (Signed)
Patient calling, would like Macarthur Critchley opinion on whether or not she could take a bus trip. Patient states that bus will stop every 2 1/2 hours and that she would be wearing compression socks.   Patient will not be available close to 10 am because she is getting hair done, patient will be available in afternoon.

## 2017-03-12 NOTE — Telephone Encounter (Signed)
Spoke with pt and went over recommendations per Truitt Merle, NP.  Pt verbalized understanding and was in agreement with this plan.

## 2017-03-12 NOTE — Telephone Encounter (Signed)
Follow up    Pt is calling to follow up on her bus trip. Please call.

## 2017-03-16 ENCOUNTER — Other Ambulatory Visit: Payer: Self-pay

## 2017-03-16 ENCOUNTER — Telehealth: Payer: Self-pay | Admitting: *Deleted

## 2017-03-16 MED ORDER — METOPROLOL SUCCINATE ER 50 MG PO TB24: ORAL_TABLET | ORAL | 4 refills | Status: DC

## 2017-03-16 NOTE — Telephone Encounter (Signed)
Patient left message stating she is going on a trip and she needs her metoprolol 50mg  to be refilled.

## 2017-03-24 ENCOUNTER — Other Ambulatory Visit: Payer: Self-pay

## 2017-03-26 ENCOUNTER — Other Ambulatory Visit: Payer: Self-pay | Admitting: General Surgery

## 2017-03-26 DIAGNOSIS — I428 Other cardiomyopathies: Secondary | ICD-10-CM | POA: Diagnosis not present

## 2017-03-26 DIAGNOSIS — Z853 Personal history of malignant neoplasm of breast: Secondary | ICD-10-CM | POA: Diagnosis not present

## 2017-03-26 DIAGNOSIS — R928 Other abnormal and inconclusive findings on diagnostic imaging of breast: Secondary | ICD-10-CM

## 2017-03-26 DIAGNOSIS — I471 Supraventricular tachycardia: Secondary | ICD-10-CM | POA: Diagnosis not present

## 2017-03-30 ENCOUNTER — Encounter: Payer: Self-pay | Admitting: Family Medicine

## 2017-03-30 ENCOUNTER — Ambulatory Visit (INDEPENDENT_AMBULATORY_CARE_PROVIDER_SITE_OTHER): Payer: Medicare Other | Admitting: Family Medicine

## 2017-03-30 VITALS — BP 118/78 | HR 62 | Resp 16 | Ht <= 58 in | Wt 169.0 lb

## 2017-03-30 DIAGNOSIS — E8881 Metabolic syndrome: Secondary | ICD-10-CM | POA: Diagnosis not present

## 2017-03-30 DIAGNOSIS — Z1211 Encounter for screening for malignant neoplasm of colon: Secondary | ICD-10-CM

## 2017-03-30 DIAGNOSIS — Z0000 Encounter for general adult medical examination without abnormal findings: Secondary | ICD-10-CM

## 2017-03-30 DIAGNOSIS — E782 Mixed hyperlipidemia: Secondary | ICD-10-CM | POA: Diagnosis not present

## 2017-03-30 LAB — LIPID PANEL
CHOLESTEROL: 175 mg/dL (ref <200)
HDL: 49 mg/dL — AB (ref >50)
LDL Cholesterol: 101 mg/dL — ABNORMAL HIGH (ref <100)
Total CHOL/HDL Ratio: 3.6 Ratio (ref <5.0)
Triglycerides: 126 mg/dL (ref <150)
VLDL: 25 mg/dL (ref <30)

## 2017-03-30 LAB — COMPREHENSIVE METABOLIC PANEL
ALBUMIN: 3.4 g/dL — AB (ref 3.6–5.1)
ALT: 8 U/L (ref 6–29)
AST: 14 U/L (ref 10–35)
Alkaline Phosphatase: 82 U/L (ref 33–130)
BILIRUBIN TOTAL: 0.3 mg/dL (ref 0.2–1.2)
BUN: 19 mg/dL (ref 7–25)
CALCIUM: 8.7 mg/dL (ref 8.6–10.4)
CHLORIDE: 109 mmol/L (ref 98–110)
CO2: 25 mmol/L (ref 20–31)
Creat: 1.16 mg/dL — ABNORMAL HIGH (ref 0.60–0.93)
GLUCOSE: 92 mg/dL (ref 65–99)
Potassium: 4.3 mmol/L (ref 3.5–5.3)
SODIUM: 141 mmol/L (ref 135–146)
Total Protein: 6.2 g/dL (ref 6.1–8.1)

## 2017-03-30 LAB — TSH: TSH: 3.57 mIU/L

## 2017-03-30 LAB — POC HEMOCCULT BLD/STL (OFFICE/1-CARD/DIAGNOSTIC): Fecal Occult Blood, POC: NEGATIVE

## 2017-03-30 NOTE — Patient Instructions (Signed)
Annual wellness visit in  4 months, call if you need me before  Labs are excellent  No med changes  Please work on good  health habits so that your health will improve. 1. Commitment to daily physical activity for 30 to 60  minutes, if you are able to do this.  2. Commitment to wise food choices. Aim for half of your  food intake to be vegetable and fruit, one quarter starchy foods, and one quarter protein. Try to eat on a regular schedule  3 meals per day, snacking between meals should be limited to vegetables or fruits or small portions of nuts. 64 ounces of water per day is generally recommended, unless you have specific health conditions, like heart failure or kidney failure where you will need to limit fluid intake.  3. Commitment to sufficient and a  good quality of physical and mental rest daily, generally between 6 to 8 hours per day.  WITH PERSISTANCE AND PERSEVERANCE, THE IMPOSSIBLE , BECOMES THE NORM! Thank you  for choosing [facility]. We consider it a privelige to serve you.  Delivering excellent health care in a caring and  compassionate way is our goal.  Partnering with you,  so that together we can achieve this goal is our strategy.

## 2017-03-31 NOTE — Assessment & Plan Note (Signed)

## 2017-03-31 NOTE — Progress Notes (Signed)
    Tara [name]     MRN: [id number]      DOB: Jun 05, 1946  HPI: Patient is in for annual physical exam. No other health concerns are expressed or addressed at the visit. Recent labs, if available are reviewed. Immunization is reviewed , and  updated if needed.   PE: BP 118/78   Pulse 62   Resp 16   Ht 4\' 9"  (1.448 m)   Wt 169 lb (76.7 kg)   SpO2 96%   BMI 36.57 kg/m   Pleasant  female, alert and oriented x 3, in no cardio-pulmonary distress. Afebrile. HEENT No facial trauma or asymetry. Sinuses non tender.  Extra occullar muscles intact, pupils equally reactive to light. External ears normal, tympanic membranes clear. Oropharynx moist, no exudate. Neck: supple, no adenopathy,JVD or thyromegaly.No bruits.  Chest: Clear to ascultation bilaterally.No crackles or wheezes. Non tender to palpation  Breast: No asymetry,no masses or lumps. No tenderness. No nipple discharge or inversion. No axillary or supraclavicular adenopathy  Cardiovascular system; Heart sounds normal,  S1 and  S2 ,no S3.  No murmur, or thrill. Apical beat not displaced Peripheral pulses normal.  Abdomen: Soft, non tender, no organomegaly or masses. No bruits. Bowel sounds normal. No guarding, tenderness or rebound.  Rectal:  Normal sphincter tone. No rectal mass. Guaiac negative stool.  GU: External genitalia normal female genitalia , normal female distribution of hair. No lesions. Urethral meatus normal in size, no  Prolapse, no lesions visibly  Present. Bladder non tender. Vagina pink and moist , with no visible lesions , discharge present . Adequate pelvic support no  cystocele or rectocele noted Cervix pink and appears healthy, no lesions or ulcerations noted, no discharge noted from os Uterus normal size, no adnexal masses, no cervical motion or adnexal tenderness.   Musculoskeletal exam: Full ROM of spine, hips , shoulders and knees. No deformity ,swelling or crepitus noted. No  muscle wasting or atrophy.   Neurologic: Cranial nerves 2 to 12 intact. Power, tone ,sensation and reflexes normal throughout. No disturbance in gait. No tremor.  Skin: Intact, no ulceration, erythema , scaling or rash noted. Pigmentation normal throughout  Psych; Normal mood and affect. Judgement and concentration normal   Assessment & Plan:  Annual physical exam Annual exam as documented. Counseling done  re healthy lifestyle involving commitment to 150 minutes exercise per week, heart healthy diet, and attaining healthy weight.The importance of adequate sleep also discussed. Regular seat belt use and home safety, is also discussed. Changes in health habits are decided on by the patient with goals and time frames  set for achieving them. Immunization and cancer screening needs are specifically addressed at this visit.

## 2017-04-09 ENCOUNTER — Encounter (HOSPITAL_COMMUNITY): Payer: Self-pay

## 2017-04-09 ENCOUNTER — Other Ambulatory Visit (HOSPITAL_COMMUNITY): Payer: Self-pay

## 2017-04-09 ENCOUNTER — Encounter (HOSPITAL_COMMUNITY): Payer: Medicare Other | Admitting: Cardiology

## 2017-04-09 ENCOUNTER — Encounter (HOSPITAL_COMMUNITY): Payer: Self-pay | Admitting: Cardiology

## 2017-04-09 ENCOUNTER — Ambulatory Visit (HOSPITAL_COMMUNITY)
Admission: RE | Admit: 2017-04-09 | Discharge: 2017-04-09 | Disposition: A | Discharge status: Home / Self Care | Payer: Medicare Other | Source: Ambulatory Visit | Attending: Cardiology | Admitting: Cardiology

## 2017-04-09 VITALS — BP 129/59 | HR 61 | Wt 166.8 lb

## 2017-04-09 DIAGNOSIS — R0600 Dyspnea, unspecified: Secondary | ICD-10-CM

## 2017-04-09 DIAGNOSIS — Z9889 Other specified postprocedural states: Secondary | ICD-10-CM | POA: Diagnosis not present

## 2017-04-09 DIAGNOSIS — E785 Hyperlipidemia, unspecified: Secondary | ICD-10-CM | POA: Insufficient documentation

## 2017-04-09 DIAGNOSIS — Z86718 Personal history of other venous thrombosis and embolism: Secondary | ICD-10-CM | POA: Diagnosis not present

## 2017-04-09 DIAGNOSIS — I5022 Chronic systolic (congestive) heart failure: Secondary | ICD-10-CM | POA: Diagnosis not present

## 2017-04-09 DIAGNOSIS — K219 Gastro-esophageal reflux disease without esophagitis: Secondary | ICD-10-CM | POA: Diagnosis not present

## 2017-04-09 DIAGNOSIS — Z9221 Personal history of antineoplastic chemotherapy: Secondary | ICD-10-CM | POA: Insufficient documentation

## 2017-04-09 DIAGNOSIS — Z923 Personal history of irradiation: Secondary | ICD-10-CM | POA: Diagnosis not present

## 2017-04-09 DIAGNOSIS — I429 Cardiomyopathy, unspecified: Secondary | ICD-10-CM | POA: Insufficient documentation

## 2017-04-09 DIAGNOSIS — Z853 Personal history of malignant neoplasm of breast: Secondary | ICD-10-CM | POA: Insufficient documentation

## 2017-04-09 DIAGNOSIS — Z800 Family history of malignant neoplasm of digestive organs: Secondary | ICD-10-CM | POA: Insufficient documentation

## 2017-04-09 DIAGNOSIS — I471 Supraventricular tachycardia: Secondary | ICD-10-CM | POA: Insufficient documentation

## 2017-04-09 MED ORDER — SACUBITRIL-VALSARTAN 49-51 MG PO TABS: 1.0000 | ORAL_TABLET | Freq: Two times a day (BID) | ORAL | 6 refills | Status: DC

## 2017-04-09 NOTE — Patient Instructions (Signed)
INCREASE Entresto to 49/51 mg twice daily. Can double up on current 24/26 mg tablets (Take 2 tabs twice daily). New Rx has been sent to your pharmacy for 49/51 mg tablets (Take 1 tablet twice daily).  You have been scheduled for a heart catheterization. Please see instruction sheet for additional details.  Follow up 3-4 weeks with Dr. Aundra Dubin. Take all medication as prescribed the day of your appointment. Bring all medications with you to your appointment.  Do the following things EVERYDAY: 1) Weigh yourself in the morning before breakfast. Write it down and keep it in a log. 2) Take your medicines as prescribed 3) Eat low salt foods-Limit salt (sodium) to 2000 mg per day.  4) Stay as active as you can everyday 5) Limit all fluids for the day to less than 2 liters

## 2017-04-09 NOTE — Progress Notes (Signed)
PCP: Dr. Moshe Cipro Cardiology: Dr. Rayann Heman HF Cardiology: Dr. Aundra Dubin  71 yo referred by Dr. Rayann Heman for evaluation of her cardiomyopathy of uncertain etiology.   Dr. Rayann Heman has followed her for a short RP SVT.  This has been quiescent recently on Toprol XL.  No palpitations.   Patient was initially found to have low EF in 2014. Repeat echo in 6/17 showed EF 35-40%. Cardiolite in 8/17 showed fixed apical defect but no evidence for ischemia. She was admitted in 1/18 with PNA, DVT, and CHF exacerbation.  Echo at that time showed EF down to 20-25%.  Troponin was mildly elevated to around 0.6. She was diuresed and discharged.   Patient was diagnosed with breast cancer in 2008 and had lumpectomy, radiation, and chemotherapy which included epirubicin, and anthracycline.  This year, she had a mammogram with calcifications in the right breast and biopsy showed a complex sclerosing lesion.  She needs lumpectomy.   Symptomatically, she is doing fairly well.  No dyspnea walking flat ground or with 1 flight of steps.  Dyspnea with "heavy work."  No chest pain.  No lightheadedness.  No orthopnea/PND.    ECG (1/18, personally reviewed): NSR, LAFB, nonspecific T wave inversions  Labs (7/18): K 4.3, creatinine 1.16, TSH normal, LDL 101  PMH: 1. 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  2. DVT in 1/18 in setting of PNA.  She was on Xarelto for about 6 months.  3. Breast cancer: Diagnosed on right in 2008.  She had lumpectomy, radiation, and chemotherapy.  She had fluorouracil, epirubicin, Cytoxan.  - Recent mammogram with right breast calcifications => biopsy with complex sclerosing lesion.  4. GERD 5. Hyperlipidemia 6. Chronic systolic CHF: Uncertain cause of cardiomyopathy.  Echo in 2014 showed EF 40%.  It is possible that it is related to epirubicin use (anthracycline).  - Echo (6/17): EF 35-40%, mild LV dilation.  - Cardiolite (8/17): no ischemia, apical lateral/apical fixed perfusion defect.  - Echo  (1/18): EF 20-25%, moderate MR, severe TR, D-shaped IV septum, PASP 35 mmHg.   FH: Colon cancer.  No known heart disease.   SH: Married, never smoked.  Lives in [place].  Retired Marine scientist.    ROS: All systems reviewed and negative except as per HPI.   Current Outpatient Prescriptions  Medication Sig Dispense Refill  . azelastine (ASTELIN) 0.1 % nasal spray Place 2 sprays into both nostrils 2 (two) times daily. Use in each nostril as directed 30 mL 12  . chlorpheniramine-HYDROcodone (TUSSIONEX PENNKINETIC ER) 10-8 MG/5ML SUER One teaspoon at bedtime as needed, for excess cough 115 mL 0  . clonazePAM (KLONOPIN) 0.5 MG tablet TAKE (1) TABLET BY MOUTH AT BEDTIME. 90 tablet 1  . docusate sodium (COLACE) 100 MG capsule Take 100 mg by mouth 2 (two) times daily.    . folic acid (FOLVITE) 1 MG tablet Take 1 tablet (1 mg total) by mouth daily. 100 tablet 3  . lovastatin (MEVACOR) 40 MG tablet Take 1 tablet (40 mg total) by mouth at bedtime. 90 tablet 1  . metoCLOPramide (REGLAN) 10 MG tablet TAKE ONE TABLET BY MOUTH TWICE DAILY. 180 tablet 1  . metoprolol succinate (TOPROL-XL) 50 MG 24 hr tablet TAKE 1 TABLET BY MOUTH ONCE A DAY WITH MEALS. 30 tablet 4  . montelukast (SINGULAIR) 10 MG tablet Take 1 tablet (10 mg total) by mouth at bedtime. 30 tablet 3  . ondansetron (ZOFRAN) 4 MG tablet Take 1 tablet (4 mg total) by mouth every 8 (eight)  hours as needed for nausea or vomiting. 20 tablet 0  . pantoprazole (PROTONIX) 40 MG tablet Take 1 tablet (40 mg total) by mouth 2 (two) times daily. 180 tablet 0  . sertraline (ZOLOFT) 50 MG tablet Take 1 tablet (50 mg total) by mouth daily. 90 tablet 1  . spironolactone (ALDACTONE) 25 MG tablet Take 1 tablet (25 mg total) by mouth daily. 30 tablet 6  . zolpidem (AMBIEN) 10 MG tablet Take 1 tablet (10 mg total) by mouth at bedtime as needed for sleep. 90 tablet 1  . sacubitril-valsartan (ENTRESTO) 49-51 MG Take 1 tablet by mouth 2 (two) times daily. 60 tablet 6   No  current facility-administered medications for this encounter.    BP (!) 129/59   Pulse 61   Wt 166 lb 12.8 oz (75.7 kg)   SpO2 100%   BMI 36.10 kg/m  General: NAD Neck: No JVD, no thyromegaly or thyroid nodule.  Lungs: Clear to auscultation bilaterally with normal respiratory effort. CV: Nondisplaced PMI.  Heart regular S1/S2, no S3/S4, no murmur.  No peripheral edema.  No carotid bruit.  Normal pedal pulses.  Abdomen: Soft, nontender, no hepatosplenomegaly, no distention.  Skin: Intact without lesions or rashes.  Neurologic: Alert and oriented x 3.  Psych: Normal affect. Extremities: No clubbing or cyanosis.  HEENT: Normal.   Assessment/Plan: 1. Chronic systolic CHF: Long-standing cardiomyopathy, since at least 2014, but EF has worsened recently.  Cause is uncertain.  Could be due to epirubicin exposure with 2008 breast cancer treatment.  However, she has never had an ischemic workup.  Troponin was mildly elevated when she was hospitalized in 1/18. On exam today, she is euvolemic. NYHA class II.  - Continue Toprol XL and spironolactone.  - Increase Entresto to 49/51 bid. BMET in 10 days.  - I think we need to rule out coronary disease, especially with need for breast surgery.  I will do a right and left heart catheterization next week.  I discussed risks/benefits of cath and she agrees to proceed.   - If cath does not show significant coronary disease, I will arrange for a cardiac MRI to assess for myocarditis, infiltrative disease.  If EF remains low, she will need an ICD (not candidate for CRT with narrow QRS.  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  Continue Toprol XL.  3. Breast cancer: She needs a lumpectomy for excision of complex sclerosing lesion.  She is clinically stable, can climb a flight of steps without trouble.  I think that she could go forward with surgery.  As I am going to do her cath next week, would probably hold off on surgery until after cath.   Loralie Champagne 04/09/2017

## 2017-04-17 ENCOUNTER — Ambulatory Visit (HOSPITAL_COMMUNITY)
Admission: RE | Admit: 2017-04-17 | Discharge: 2017-04-17 | Disposition: A | Discharge status: Home / Self Care | Payer: Medicare Other | Source: Ambulatory Visit | Attending: Cardiology | Admitting: Cardiology

## 2017-04-17 DIAGNOSIS — Z539 Procedure and treatment not carried out, unspecified reason: Secondary | ICD-10-CM | POA: Diagnosis not present

## 2017-04-17 DIAGNOSIS — I491 Atrial premature depolarization: Secondary | ICD-10-CM | POA: Insufficient documentation

## 2017-04-17 DIAGNOSIS — R0600 Dyspnea, unspecified: Secondary | ICD-10-CM

## 2017-04-17 LAB — CBC
HEMATOCRIT: 36.2 % (ref 36.0–46.0)
HEMOGLOBIN: 11.3 g/dL — AB (ref 12.0–15.0)
MCH: 27.4 pg (ref 26.0–34.0)
MCHC: 31.2 g/dL (ref 30.0–36.0)
MCV: 87.9 fL (ref 78.0–100.0)
Platelets: 263 10*3/uL (ref 150–400)
RBC: 4.12 MIL/uL (ref 3.87–5.11)
RDW: 13.5 % (ref 11.5–15.5)
WBC: 12.6 10*3/uL — AB (ref 4.0–10.5)

## 2017-04-17 LAB — BASIC METABOLIC PANEL
ANION GAP: 10 (ref 5–15)
BUN: 49 mg/dL — AB (ref 6–20)
CHLORIDE: 108 mmol/L (ref 101–111)
CO2: 22 mmol/L (ref 22–32)
Calcium: 8.5 mg/dL — ABNORMAL LOW (ref 8.9–10.3)
Creatinine, Ser: 1.83 mg/dL — ABNORMAL HIGH (ref 0.44–1.00)
GFR calc Af Amer: 31 mL/min — ABNORMAL LOW (ref >60)
GFR, EST NON AFRICAN AMERICAN: 27 mL/min — AB (ref >60)
GLUCOSE: 109 mg/dL — AB (ref 65–99)
POTASSIUM: 4.5 mmol/L (ref 3.5–5.1)
Sodium: 140 mmol/L (ref 135–145)

## 2017-04-17 LAB — PROTIME-INR
INR: 0.93
Prothrombin Time: 12.5 seconds (ref 11.4–15.2)

## 2017-04-17 MED ORDER — SODIUM CHLORIDE 0.9% FLUSH: 3.0000 mL | INTRAVENOUS | Status: DC | PRN

## 2017-04-17 MED ORDER — SODIUM CHLORIDE 0.9 % IV SOLN
INTRAVENOUS | Status: DC
  Administered 2017-04-17: 08:00:00 via INTRAVENOUS

## 2017-04-17 MED ORDER — SODIUM CHLORIDE 0.9 % IV SOLN: 250.0000 mL | INTRAVENOUS | Status: DC | PRN

## 2017-04-17 MED ORDER — SODIUM CHLORIDE 0.9% FLUSH: 3.0000 mL | Freq: Two times a day (BID) | INTRAVENOUS | Status: DC

## 2017-04-17 MED ORDER — ASPIRIN 81 MG PO CHEW: 81.0000 mg | CHEWABLE_TABLET | ORAL | Status: DC

## 2017-04-20 ENCOUNTER — Telehealth (HOSPITAL_COMMUNITY): Payer: Self-pay | Admitting: Cardiology

## 2017-04-20 ENCOUNTER — Other Ambulatory Visit (HOSPITAL_COMMUNITY): Payer: Self-pay

## 2017-04-20 ENCOUNTER — Other Ambulatory Visit: Payer: Self-pay | Admitting: Family Medicine

## 2017-04-20 MED ORDER — SACUBITRIL-VALSARTAN 49-51 MG PO TABS: 1.0000 | ORAL_TABLET | Freq: Two times a day (BID) | ORAL | 6 refills | Status: DC

## 2017-04-20 NOTE — Telephone Encounter (Signed)
Patient unable get cath 7/27 re: elevated Cr, per Dr Aundra Dubin will need repeat bmet on 8/2 and repeat cath 8/3.   Lab appt 8/2 @ 1030- no answer unable to leave message Voicemail full.

## 2017-04-21 MED ORDER — SACUBITRIL-VALSARTAN 24-26 MG PO TABS: 1.0000 | ORAL_TABLET | Freq: Two times a day (BID) | ORAL | 3 refills | Status: DC

## 2017-04-21 NOTE — Telephone Encounter (Signed)
PATIENT REPORTS SHE CANNOT COME FOR CATH ON FRI 05/17/23, DEATH IN THE FAMILY, WILL CALL BACK TO RESCHEDULE  ADVISED TO HAVE LABS CHECKED SOONER RATHER THAN LATER IF SHE COULD AND WE CAN RESCHEDULE CATH AT HER EARLIEST CONVENIENCE   PATIENT REPORTS SHE DO HER BEST REQUESTS A NEW RX FOR ENTRESTO, REPORTS [facility] DECREASED DOSE FROM 49/51 BACK TO 24/26- CONFIRMED WITH DR [facility]

## 2017-04-22 ENCOUNTER — Other Ambulatory Visit (HOSPITAL_COMMUNITY): Payer: Self-pay | Admitting: *Deleted

## 2017-04-22 ENCOUNTER — Inpatient Hospital Stay (HOSPITAL_COMMUNITY): Admission: RE | Admit: 2017-04-22 | Payer: Medicare Other | Source: Ambulatory Visit

## 2017-04-22 DIAGNOSIS — I428 Other cardiomyopathies: Secondary | ICD-10-CM

## 2017-04-24 SURGERY — RIGHT/LEFT HEART CATH AND CORONARY ANGIOGRAPHY
Anesthesia: LOCAL

## 2017-04-29 ENCOUNTER — Encounter (HOSPITAL_COMMUNITY): Payer: Medicare Other | Admitting: Cardiology

## 2017-04-29 ENCOUNTER — Telehealth (HOSPITAL_COMMUNITY): Payer: Self-pay

## 2017-04-29 NOTE — Telephone Encounter (Signed)
Patient called to cancel her apt with Dr. Aundra Dubin today as it was a post cath apt and patient cancelled her cath due to death in the family. Per Dr. Aundra Dubin scheduled patient for a bmet next Monday. Patient also needing to reschedule her heart cath per Dr. Aundra Dubin, however patient states she still needs some time and is not ready to move forward with procedure at this time.  Advised when she is ready to call CHF clinic to schedule. Will forward to Dr. Aundra Dubin to make him aware. Aware and agreeable.  Renee Pain, RN

## 2017-05-04 ENCOUNTER — Ambulatory Visit (HOSPITAL_COMMUNITY)
Admission: RE | Admit: 2017-05-04 | Discharge: 2017-05-04 | Disposition: A | Discharge status: Home / Self Care | Payer: Medicare Other | Source: Ambulatory Visit | Attending: Internal Medicine | Admitting: Internal Medicine

## 2017-05-04 DIAGNOSIS — I428 Other cardiomyopathies: Secondary | ICD-10-CM | POA: Diagnosis present

## 2017-05-04 DIAGNOSIS — R928 Other abnormal and inconclusive findings on diagnostic imaging of breast: Secondary | ICD-10-CM

## 2017-05-04 LAB — BASIC METABOLIC PANEL
ANION GAP: 6 (ref 5–15)
BUN: 32 mg/dL — AB (ref 6–20)
CO2: 26 mmol/L (ref 22–32)
Calcium: 9.3 mg/dL (ref 8.9–10.3)
Chloride: 108 mmol/L (ref 101–111)
Creatinine, Ser: 1.45 mg/dL — ABNORMAL HIGH (ref 0.44–1.00)
GFR calc Af Amer: 41 mL/min — ABNORMAL LOW (ref >60)
GFR calc non Af Amer: 35 mL/min — ABNORMAL LOW (ref >60)
Glucose, Bld: 102 mg/dL — ABNORMAL HIGH (ref 65–99)
POTASSIUM: 5.3 mmol/L — AB (ref 3.5–5.1)
Sodium: 140 mmol/L (ref 135–145)

## 2017-05-15 ENCOUNTER — Other Ambulatory Visit: Payer: Self-pay | Admitting: Family Medicine

## 2017-05-15 ENCOUNTER — Telehealth (HOSPITAL_COMMUNITY): Payer: Self-pay | Admitting: Cardiology

## 2017-05-15 NOTE — Telephone Encounter (Signed)
Per staff message from Georgeanna Lea, RN, I tried to call patient to schedule f/u appt with Dr. Aundra Dubin.  Pt's VM is full and will not allow me to leave a message.  Will try to reach patient later today.

## 2017-05-19 ENCOUNTER — Encounter (HOSPITAL_COMMUNITY): Payer: Self-pay | Admitting: Cardiology

## 2017-05-19 NOTE — Telephone Encounter (Signed)
I have not been able to reach patient as her VM is still full.  Letter mailed to patient requesting she call our office to schedule her appointment with Dr. Aundra Dubin.

## 2017-05-22 ENCOUNTER — Other Ambulatory Visit: Payer: Self-pay | Admitting: Family Medicine

## 2017-06-11 ENCOUNTER — Other Ambulatory Visit: Payer: Self-pay | Admitting: Family Medicine

## 2017-06-11 ENCOUNTER — Other Ambulatory Visit: Payer: Self-pay | Admitting: Internal Medicine

## 2017-06-11 NOTE — Telephone Encounter (Signed)
Seen 4 12 18

## 2017-06-17 ENCOUNTER — Telehealth: Payer: Self-pay | Admitting: Family Medicine

## 2017-06-17 MED ORDER — LOVASTATIN 40 MG PO TABS: 40.0000 mg | ORAL_TABLET | Freq: Every day | ORAL | 1 refills | Status: DC

## 2017-06-17 NOTE — Telephone Encounter (Signed)
Refilled medication

## 2017-06-22 ENCOUNTER — Other Ambulatory Visit: Payer: Self-pay | Admitting: Family Medicine

## 2017-07-08 ENCOUNTER — Ambulatory Visit (HOSPITAL_COMMUNITY)
Admission: RE | Admit: 2017-07-08 | Discharge: 2017-07-08 | Disposition: A | Discharge status: Home / Self Care | Payer: Medicare Other | Source: Ambulatory Visit | Attending: Cardiology | Admitting: Cardiology

## 2017-07-08 ENCOUNTER — Encounter (HOSPITAL_COMMUNITY): Payer: Self-pay

## 2017-07-08 ENCOUNTER — Other Ambulatory Visit (HOSPITAL_COMMUNITY): Payer: Self-pay

## 2017-07-08 ENCOUNTER — Encounter (HOSPITAL_COMMUNITY): Payer: Self-pay | Admitting: Cardiology

## 2017-07-08 VITALS — BP 114/57 | HR 60 | Wt 181.0 lb

## 2017-07-08 DIAGNOSIS — E785 Hyperlipidemia, unspecified: Secondary | ICD-10-CM | POA: Diagnosis not present

## 2017-07-08 DIAGNOSIS — I471 Supraventricular tachycardia: Secondary | ICD-10-CM | POA: Insufficient documentation

## 2017-07-08 DIAGNOSIS — I428 Other cardiomyopathies: Secondary | ICD-10-CM | POA: Diagnosis not present

## 2017-07-08 DIAGNOSIS — Z79899 Other long term (current) drug therapy: Secondary | ICD-10-CM | POA: Insufficient documentation

## 2017-07-08 DIAGNOSIS — I429 Cardiomyopathy, unspecified: Secondary | ICD-10-CM | POA: Diagnosis not present

## 2017-07-08 DIAGNOSIS — Z7982 Long term (current) use of aspirin: Secondary | ICD-10-CM | POA: Diagnosis not present

## 2017-07-08 DIAGNOSIS — I38 Endocarditis, valve unspecified: Secondary | ICD-10-CM

## 2017-07-08 DIAGNOSIS — I5022 Chronic systolic (congestive) heart failure: Secondary | ICD-10-CM

## 2017-07-08 DIAGNOSIS — Z853 Personal history of malignant neoplasm of breast: Secondary | ICD-10-CM | POA: Diagnosis not present

## 2017-07-08 DIAGNOSIS — K219 Gastro-esophageal reflux disease without esophagitis: Secondary | ICD-10-CM | POA: Diagnosis not present

## 2017-07-08 LAB — BASIC METABOLIC PANEL WITH GFR
Anion gap: 8 (ref 5–15)
BUN: 34 mg/dL — ABNORMAL HIGH (ref 6–20)
CO2: 22 mmol/L (ref 22–32)
Calcium: 8.9 mg/dL (ref 8.9–10.3)
Chloride: 108 mmol/L (ref 101–111)
Creatinine, Ser: 1.26 mg/dL — ABNORMAL HIGH (ref 0.44–1.00)
GFR calc Af Amer: 48 mL/min — ABNORMAL LOW
GFR calc non Af Amer: 42 mL/min — ABNORMAL LOW
Glucose, Bld: 94 mg/dL (ref 65–99)
Potassium: 4.3 mmol/L (ref 3.5–5.1)
Sodium: 138 mmol/L (ref 135–145)

## 2017-07-08 LAB — CBC
HCT: 31.4 % — ABNORMAL LOW (ref 36.0–46.0)
HEMOGLOBIN: 10 g/dL — AB (ref 12.0–15.0)
MCH: 26.7 pg (ref 26.0–34.0)
MCHC: 31.8 g/dL (ref 30.0–36.0)
MCV: 83.7 fL (ref 78.0–100.0)
Platelets: 235 10*3/uL (ref 150–400)
RBC: 3.75 MIL/uL — AB (ref 3.87–5.11)
RDW: 15.1 % (ref 11.5–15.5)
WBC: 9.6 10*3/uL (ref 4.0–10.5)

## 2017-07-08 NOTE — Patient Instructions (Signed)
Labs drawn today (if we do not call you, then your lab work was stable)   Handicap sticker given  Your physician has requested that you have a cardiac catheterization. Cardiac catheterization is used to diagnose and/or treat various heart conditions. Doctors may recommend this procedure for a number of different reasons. The most common reason is to evaluate chest pain. Chest pain can be a symptom of coronary artery disease (CAD), and cardiac catheterization can show whether plaque is narrowing or blocking your heart's arteries. This procedure is also used to evaluate the valves, as well as measure the blood flow and oxygen levels in different parts of your heart. For further information please visit [email or web address]. Please follow instruction sheet, as given.  Your physician recommends that you schedule a follow-up appointment in: 2 weeks post catheterization.

## 2017-07-09 NOTE — Progress Notes (Signed)
PCP: Dr. Moshe Cipro Cardiology: Dr. Rayann Heman HF Cardiology: Dr. Aundra Dubin  71 yo returns for followup of CHF.   Dr. Rayann Heman has followed her for a short RP SVT.  This has been quiescent recently on Toprol XL.  No palpitations.   Patient was initially found to have low EF in 2014. Repeat echo in 6/17 showed EF 35-40%. Cardiolite in 8/17 showed fixed apical defect but no evidence for ischemia. She was admitted in 1/18 with PNA, DVT, and CHF exacerbation.  Echo at that time showed EF down to 20-25%.  Troponin was mildly elevated to around 0.6. She was diuresed and discharged.   Patient was diagnosed with breast cancer in 2008 and had lumpectomy, radiation, and chemotherapy which included epirubicin, and anthracycline.  This year, she had a mammogram with calcifications in the right breast and biopsy showed a complex sclerosing lesion.  She needs lumpectomy.   We had set her up for right and left heart catheterization to help define her cardiomyopathy, but this was cancelled after the death of her sister.  We increased her Entresto to 49/51 bid last appointment, but she decreased it back to 24/26 due to rise in creatinine. Weight is up about 15 lbs.  She says that she has been eating out a lot and not watching her diet at all. She is short of breath changing the bed linens, carrying the laundary basket. No dyspnea walking on flat ground.  No chest pain.  No orthopnea/PND.     Labs (7/18): K 4.3, creatinine 1.16, TSH normal, LDL 101 Labs (8/18): K 5.3, creatinine 1.45  PMH: 1. 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  2. DVT in 1/18 in setting of PNA.  She was on Xarelto for about 6 months.  3. Breast cancer: Diagnosed on right in 2008.  She had lumpectomy, radiation, and chemotherapy.  She had fluorouracil, epirubicin, Cytoxan.  - Recent mammogram with right breast calcifications => biopsy with complex sclerosing lesion.  4. GERD 5. Hyperlipidemia 6. Chronic systolic CHF: Uncertain cause of  cardiomyopathy.  Echo in 2014 showed EF 40%.  It is possible that it is related to epirubicin use (anthracycline).  - Echo (6/17): EF 35-40%, mild LV dilation.  - Cardiolite (8/17): no ischemia, apical lateral/apical fixed perfusion defect.  - Echo (1/18): EF 20-25%, moderate MR, severe TR, D-shaped IV septum, PASP 35 mmHg.   FH: Colon cancer.  No known heart disease.   SH: Married, never smoked.  Lives in [place].  Retired Marine scientist.    ROS: All systems reviewed and negative except as per HPI.   Current Outpatient Prescriptions  Medication Sig Dispense Refill  . azelastine (ASTELIN) 0.1 % nasal spray Place 2 sprays into both nostrils 2 (two) times daily. Use in each nostril as directed (Patient taking differently: Place 2 sprays into both nostrils 2 (two) times daily as needed (FOR ALLERGIES.). Use in each nostril as directed) 30 mL 12  . clonazePAM (KLONOPIN) 0.5 MG tablet TAKE (1) TABLET BY MOUTH AT BEDTIME. 90 tablet 1  . docusate sodium (COLACE) 100 MG capsule Take 200 mg by mouth 2 (two) times daily.     . folic acid (FOLVITE) 1 MG tablet Take 1 tablet (1 mg total) by mouth daily. (Patient taking differently: Take 1 mg by mouth daily at 12 noon. (1300)) 100 tablet 3  . lovastatin (MEVACOR) 40 MG tablet Take 1 tablet (40 mg total) by mouth at bedtime. 90 tablet 1  . metoCLOPramide (REGLAN) 10 MG tablet TAKE  ONE TABLET BY MOUTH TWICE DAILY. (Patient taking differently: TAKE ONE TABLET BY MOUTH TWICE DAILY AT 1300 & 1600.) 180 tablet 1  . metoprolol succinate (TOPROL-XL) 50 MG 24 hr tablet TAKE 1 TABLET BY MOUTH ONCE A DAY WITH MEALS. (Patient taking differently: Take 50 mg by mouth daily at 12 noon. (1300)) 30 tablet 4  . montelukast (SINGULAIR) 10 MG tablet TAKE ONE TABLET BY MOUTH AT BEDTIME. 30 tablet 0  . pantoprazole (PROTONIX) 40 MG tablet TAKE (1) TABLET BY MOUTH TWICE DAILY. (Patient taking differently: TAKE (1) TABLET BY MOUTH TWICE DAILY AT 1300 & 1600) 180 tablet 0  .  sacubitril-valsartan (ENTRESTO) 24-26 MG Take 1 tablet by mouth 2 (two) times daily. (Patient taking differently: Take 1 tablet by mouth 2 (two) times daily. 1300 & 1600) 60 tablet 3  . sertraline (ZOLOFT) 50 MG tablet TAKE 1 TABLET BY MOUTH ONCE A DAY. (Patient taking differently: TAKE 1 TABLET BY MOUTH ONCE DAILY AT 1300.) 90 tablet 0  . spironolactone (ALDACTONE) 25 MG tablet TAKE 1 TABLET BY MOUTH ONCE A DAY. (Patient taking differently: TAKE 1 TABLET BY MOUTH ONCE A DAILY AT 1300.) 30 tablet 5  . zolpidem (AMBIEN) 10 MG tablet TAKE 1 TABLET BY MOUTH AT BEDTIME AS NEEDED FOR SLEEP. (Patient taking differently: TAKE 1 TABLET BY MOUTH AT BEDTIME FOR SLEEP.) 90 tablet 0  . acetaminophen (TYLENOL) 500 MG tablet Take 500-1,000 mg by mouth every 6 (six) hours as needed (FOR PAIN.).    Marland Kitchen aspirin EC 81 MG tablet Take 81 mg by mouth daily at 12 noon. (1300)     No current facility-administered medications for this encounter.    BP (!) 114/57   Pulse 60   Wt 181 lb (82.1 kg)   SpO2 98%   BMI 39.17 kg/m  General: NAD Neck: No JVD, no thyromegaly or thyroid nodule.  Lungs: Clear to auscultation bilaterally with normal respiratory effort. CV: Nondisplaced PMI.  Heart regular S1/S2, no S3/S4, no murmur.  No peripheral edema.  No carotid bruit.  Normal pedal pulses.  Abdomen: Soft, nontender, no hepatosplenomegaly, no distention.  Skin: Intact without lesions or rashes.  Neurologic: Alert and oriented x 3.  Psych: Normal affect. Extremities: No clubbing or cyanosis.  HEENT: Normal.   Assessment/Plan: 1. Chronic systolic CHF: Long-standing cardiomyopathy, since at least 2014, but EF has worsened recently.  Cause is uncertain.  Could be due to epirubicin exposure with 2008 breast cancer treatment.  However, she has never had an ischemic workup.  Troponin was mildly elevated when she was hospitalized in 1/18. Euvolemic on exam today, NYHA class II-III.  I think that her weight gain is primarily caloric  rather than due to fluid overload.  - Continue Toprol XL and spironolactone.  - Creatinine rose with increase in Entresto to 49/52 bid, so keep at 24/26 bid.  BMET today.  - I think we need to rule out coronary disease, especially with need for breast surgery.  I will do a right and left heart catheterization later this week.  I discussed risks/benefits of cath and she agrees to proceed.   - If cath does not show significant coronary disease, I will arrange for a cardiac MRI to assess for myocarditis, infiltrative disease.  If EF remains low, she will need an ICD (not candidate for CRT with narrow QRS.  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  Continue Toprol XL.  3. Breast cancer: She needs a lumpectomy for excision of complex sclerosing  lesion.  She is clinically stable, can climb a flight of steps without trouble.  I think that she could go forward with surgery.  As I am going to do her cath this week, would probably hold off on surgery until after cath.   Loralie Champagne 07/09/2017

## 2017-07-10 ENCOUNTER — Ambulatory Visit (HOSPITAL_COMMUNITY)
Admission: RE | Admit: 2017-07-10 | Discharge: 2017-07-10 | Disposition: A | Discharge status: Home / Self Care | Payer: Medicare Other | Source: Ambulatory Visit | Attending: Cardiology | Admitting: Cardiology

## 2017-07-10 ENCOUNTER — Ambulatory Visit (HOSPITAL_COMMUNITY)
Admission: RE | Disposition: A | Discharge status: Home / Self Care | Payer: Self-pay | Source: Ambulatory Visit | Attending: Cardiology

## 2017-07-10 DIAGNOSIS — I429 Cardiomyopathy, unspecified: Secondary | ICD-10-CM | POA: Diagnosis not present

## 2017-07-10 DIAGNOSIS — Z853 Personal history of malignant neoplasm of breast: Secondary | ICD-10-CM | POA: Insufficient documentation

## 2017-07-10 DIAGNOSIS — Z86718 Personal history of other venous thrombosis and embolism: Secondary | ICD-10-CM | POA: Insufficient documentation

## 2017-07-10 DIAGNOSIS — E785 Hyperlipidemia, unspecified: Secondary | ICD-10-CM | POA: Diagnosis not present

## 2017-07-10 DIAGNOSIS — I428 Other cardiomyopathies: Secondary | ICD-10-CM | POA: Insufficient documentation

## 2017-07-10 DIAGNOSIS — Z7982 Long term (current) use of aspirin: Secondary | ICD-10-CM | POA: Insufficient documentation

## 2017-07-10 DIAGNOSIS — Z9221 Personal history of antineoplastic chemotherapy: Secondary | ICD-10-CM | POA: Insufficient documentation

## 2017-07-10 DIAGNOSIS — Z923 Personal history of irradiation: Secondary | ICD-10-CM | POA: Insufficient documentation

## 2017-07-10 DIAGNOSIS — K219 Gastro-esophageal reflux disease without esophagitis: Secondary | ICD-10-CM | POA: Insufficient documentation

## 2017-07-10 DIAGNOSIS — I5022 Chronic systolic (congestive) heart failure: Secondary | ICD-10-CM | POA: Insufficient documentation

## 2017-07-10 DIAGNOSIS — I272 Pulmonary hypertension, unspecified: Secondary | ICD-10-CM | POA: Insufficient documentation

## 2017-07-10 DIAGNOSIS — I38 Endocarditis, valve unspecified: Secondary | ICD-10-CM

## 2017-07-10 DIAGNOSIS — I471 Supraventricular tachycardia: Secondary | ICD-10-CM | POA: Diagnosis not present

## 2017-07-10 HISTORY — PX: RIGHT/LEFT HEART CATH AND CORONARY ANGIOGRAPHY: CATH118266

## 2017-07-10 LAB — POCT I-STAT 3, VENOUS BLOOD GAS (G3P V)
Acid-base deficit: 5 mmol/L — ABNORMAL HIGH (ref 0.0–2.0)
Acid-base deficit: 6 mmol/L — ABNORMAL HIGH (ref 0.0–2.0)
BICARBONATE: 21.3 mmol/L (ref 20.0–28.0)
Bicarbonate: 19.9 mmol/L — ABNORMAL LOW (ref 20.0–28.0)
O2 Saturation: 70 %
O2 Saturation: 73 %
PCO2 VEN: 41.4 mmHg — AB (ref 44.0–60.0)
PH VEN: 7.289 (ref 7.250–7.430)
PH VEN: 7.302 (ref 7.250–7.430)
PO2 VEN: 42 mmHg (ref 32.0–45.0)
TCO2: 21 mmol/L — ABNORMAL LOW (ref 22–32)
TCO2: 23 mmol/L (ref 22–32)
pCO2, Ven: 43.1 mmHg — ABNORMAL LOW (ref 44.0–60.0)
pO2, Ven: 41 mmHg (ref 32.0–45.0)

## 2017-07-10 LAB — PROTIME-INR
INR: 1.02
Prothrombin Time: 13.3 seconds (ref 11.4–15.2)

## 2017-07-10 SURGERY — RIGHT/LEFT HEART CATH AND CORONARY ANGIOGRAPHY
Anesthesia: LOCAL

## 2017-07-10 MED ORDER — LIDOCAINE HCL 2 % IJ SOLN
INTRAMUSCULAR | Status: AC
  Filled 2017-07-10: qty 10

## 2017-07-10 MED ORDER — ASPIRIN 81 MG PO CHEW: 81.0000 mg | CHEWABLE_TABLET | ORAL | Status: DC

## 2017-07-10 MED ORDER — VERAPAMIL HCL 2.5 MG/ML IV SOLN
INTRAVENOUS | Status: AC
  Filled 2017-07-10: qty 2

## 2017-07-10 MED ORDER — LIDOCAINE HCL (PF) 1 % IJ SOLN
INTRAMUSCULAR | Status: DC | PRN
  Administered 2017-07-10: 4 mL via INTRADERMAL

## 2017-07-10 MED ORDER — VERAPAMIL HCL 2.5 MG/ML IV SOLN
INTRAVENOUS | Status: DC | PRN
  Administered 2017-07-10: 12:00:00 via INTRA_ARTERIAL

## 2017-07-10 MED ORDER — SODIUM CHLORIDE 0.9 % IV SOLN: 250.0000 mL | INTRAVENOUS | Status: DC | PRN

## 2017-07-10 MED ORDER — FENTANYL CITRATE (PF) 100 MCG/2ML IJ SOLN
INTRAMUSCULAR | Status: DC | PRN
  Administered 2017-07-10: 25 ug via INTRAVENOUS

## 2017-07-10 MED ORDER — HEPARIN SODIUM (PORCINE) 1000 UNIT/ML IJ SOLN
INTRAMUSCULAR | Status: AC
  Filled 2017-07-10: qty 1

## 2017-07-10 MED ORDER — MIDAZOLAM HCL 2 MG/2ML IJ SOLN
INTRAMUSCULAR | Status: AC
  Filled 2017-07-10: qty 2

## 2017-07-10 MED ORDER — MIDAZOLAM HCL 2 MG/2ML IJ SOLN
INTRAMUSCULAR | Status: DC | PRN
  Administered 2017-07-10: 1 mg via INTRAVENOUS

## 2017-07-10 MED ORDER — HEPARIN (PORCINE) IN NACL 2-0.9 UNIT/ML-% IJ SOLN
INTRAMUSCULAR | Status: AC
Start: 2017-07-10 — End: 2017-07-10
  Filled 2017-07-10: qty 1000

## 2017-07-10 MED ORDER — SODIUM CHLORIDE 0.9% FLUSH: 3.0000 mL | Freq: Two times a day (BID) | INTRAVENOUS | Status: DC

## 2017-07-10 MED ORDER — HEPARIN SODIUM (PORCINE) 1000 UNIT/ML IJ SOLN
INTRAMUSCULAR | Status: DC | PRN
  Administered 2017-07-10: 4000 [IU] via INTRAVENOUS

## 2017-07-10 MED ORDER — IOPAMIDOL (ISOVUE-370) INJECTION 76%
INTRAVENOUS | Status: DC | PRN
  Administered 2017-07-10: 60 mL via INTRAVENOUS

## 2017-07-10 MED ORDER — FENTANYL CITRATE (PF) 100 MCG/2ML IJ SOLN
INTRAMUSCULAR | Status: AC
  Filled 2017-07-10: qty 2

## 2017-07-10 MED ORDER — SODIUM CHLORIDE 0.9% FLUSH: 3.0000 mL | INTRAVENOUS | Status: DC | PRN

## 2017-07-10 MED ORDER — IOPAMIDOL (ISOVUE-370) INJECTION 76%
INTRAVENOUS | Status: AC
  Filled 2017-07-10: qty 100

## 2017-07-10 MED ORDER — HEPARIN (PORCINE) IN NACL 2-0.9 UNIT/ML-% IJ SOLN
INTRAMUSCULAR | Status: AC | PRN
  Administered 2017-07-10: 1000 mL

## 2017-07-10 MED ORDER — SODIUM CHLORIDE 0.9 % IV SOLN
INTRAVENOUS | Status: DC
  Administered 2017-07-10: 10:00:00 via INTRAVENOUS

## 2017-07-10 SURGICAL SUPPLY — 15 items
CATH BALLN WEDGE 5F 110CM (CATHETERS) ×1 IMPLANT
CATH INFINITI 5 FR JL3.5 (CATHETERS) ×1 IMPLANT
CATH INFINITI 5FR ANG PIGTAIL (CATHETERS) ×1 IMPLANT
CATH INFINITI 5FR JL4 (CATHETERS) ×1 IMPLANT
CATH INFINITI JR4 5F (CATHETERS) ×1 IMPLANT
DEVICE RAD COMP TR BAND LRG (VASCULAR PRODUCTS) ×1 IMPLANT
GLIDESHEATH SLEND A-KIT 6F 22G (SHEATH) ×1 IMPLANT
GLIDESHEATH SLEND SS 6F .021 (SHEATH) ×1 IMPLANT
GUIDEWIRE INQWIRE 1.5J.035X260 (WIRE) IMPLANT
INQWIRE 1.5J .035X260CM (WIRE) ×2
KIT HEART LEFT (KITS) ×2 IMPLANT
PACK CARDIAC CATHETERIZATION (CUSTOM PROCEDURE TRAY) ×2 IMPLANT
SHEATH GLIDE SLENDER 4/5FR (SHEATH) ×1 IMPLANT
TRANSDUCER W/STOPCOCK (MISCELLANEOUS) ×2 IMPLANT
TUBING CIL FLEX 10 FLL-RA (TUBING) ×2 IMPLANT

## 2017-07-10 NOTE — Interval H&P Note (Signed)
History and Physical Interval Note:  07/10/2017 11:43 AM  Tara [name]  has presented today for surgery, with the diagnosis of hf  The various methods of treatment have been discussed with the patient and family. After consideration of risks, benefits and other options for treatment, the patient has consented to  Procedure(s): RIGHT/LEFT HEART CATH AND CORONARY ANGIOGRAPHY (N/A) as a surgical intervention .  The patient's history has been reviewed, patient examined, no change in status, stable for surgery.  I have reviewed the patient's chart and labs.  Questions were answered to the patient's satisfaction.     [place] [employer]

## 2017-07-10 NOTE — Discharge Instructions (Signed)

## 2017-07-10 NOTE — H&P (View-Only) (Signed)
PCP: Dr. Moshe Cipro Cardiology: Dr. Rayann Heman HF Cardiology: Dr. Aundra Dubin  71 yo returns for followup of CHF.   Dr. Rayann Heman has followed her for a short RP SVT.  This has been quiescent recently on Toprol XL.  No palpitations.   Patient was initially found to have low EF in 2014. Repeat echo in 6/17 showed EF 35-40%. Cardiolite in 8/17 showed fixed apical defect but no evidence for ischemia. She was admitted in 1/18 with PNA, DVT, and CHF exacerbation.  Echo at that time showed EF down to 20-25%.  Troponin was mildly elevated to around 0.6. She was diuresed and discharged.   Patient was diagnosed with breast cancer in 2008 and had lumpectomy, radiation, and chemotherapy which included epirubicin, and anthracycline.  This year, she had a mammogram with calcifications in the right breast and biopsy showed a complex sclerosing lesion.  She needs lumpectomy.   We had set her up for right and left heart catheterization to help define her cardiomyopathy, but this was cancelled after the death of her sister.  We increased her Entresto to 49/51 bid last appointment, but she decreased it back to 24/26 due to rise in creatinine. Weight is up about 15 lbs.  She says that she has been eating out a lot and not watching her diet at all. She is short of breath changing the bed linens, carrying the laundary basket. No dyspnea walking on flat ground.  No chest pain.  No orthopnea/PND.     Labs (7/18): K 4.3, creatinine 1.16, TSH normal, LDL 101 Labs (8/18): K 5.3, creatinine 1.45  PMH: 1. 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  2. DVT in 1/18 in setting of PNA.  She was on Xarelto for about 6 months.  3. Breast cancer: Diagnosed on right in 2008.  She had lumpectomy, radiation, and chemotherapy.  She had fluorouracil, epirubicin, Cytoxan.  - Recent mammogram with right breast calcifications => biopsy with complex sclerosing lesion.  4. GERD 5. Hyperlipidemia 6. Chronic systolic CHF: Uncertain cause of  cardiomyopathy.  Echo in 2014 showed EF 40%.  It is possible that it is related to epirubicin use (anthracycline).  - Echo (6/17): EF 35-40%, mild LV dilation.  - Cardiolite (8/17): no ischemia, apical lateral/apical fixed perfusion defect.  - Echo (1/18): EF 20-25%, moderate MR, severe TR, D-shaped IV septum, PASP 35 mmHg.   FH: Colon cancer.  No known heart disease.   SH: Married, never smoked.  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    ROS: All systems reviewed and negative except as per HPI.   Current Outpatient Prescriptions  Medication Sig Dispense Refill  . azelastine (ASTELIN) 0.1 % nasal spray Place 2 sprays into both nostrils 2 (two) times daily. Use in each nostril as directed (Patient taking differently: Place 2 sprays into both nostrils 2 (two) times daily as needed (FOR ALLERGIES.). Use in each nostril as directed) 30 mL 12  . clonazePAM (KLONOPIN) 0.5 MG tablet TAKE (1) TABLET BY MOUTH AT BEDTIME. 90 tablet 1  . docusate sodium (COLACE) 100 MG capsule Take 200 mg by mouth 2 (two) times daily.     . folic acid (FOLVITE) 1 MG tablet Take 1 tablet (1 mg total) by mouth daily. (Patient taking differently: Take 1 mg by mouth daily at 12 noon. (1300)) 100 tablet 3  . lovastatin (MEVACOR) 40 MG tablet Take 1 tablet (40 mg total) by mouth at bedtime. 90 tablet 1  . metoCLOPramide (REGLAN) 10 MG tablet TAKE  ONE TABLET BY MOUTH TWICE DAILY. (Patient taking differently: TAKE ONE TABLET BY MOUTH TWICE DAILY AT 1300 & 1600.) 180 tablet 1  . metoprolol succinate (TOPROL-XL) 50 MG 24 hr tablet TAKE 1 TABLET BY MOUTH ONCE A DAY WITH MEALS. (Patient taking differently: Take 50 mg by mouth daily at 12 noon. (1300)) 30 tablet 4  . montelukast (SINGULAIR) 10 MG tablet TAKE ONE TABLET BY MOUTH AT BEDTIME. 30 tablet 0  . pantoprazole (PROTONIX) 40 MG tablet TAKE (1) TABLET BY MOUTH TWICE DAILY. (Patient taking differently: TAKE (1) TABLET BY MOUTH TWICE DAILY AT 1300 & 1600) 180 tablet 0  .  sacubitril-valsartan (ENTRESTO) 24-26 MG Take 1 tablet by mouth 2 (two) times daily. (Patient taking differently: Take 1 tablet by mouth 2 (two) times daily. 1300 & 1600) 60 tablet 3  . sertraline (ZOLOFT) 50 MG tablet TAKE 1 TABLET BY MOUTH ONCE A DAY. (Patient taking differently: TAKE 1 TABLET BY MOUTH ONCE DAILY AT 1300.) 90 tablet 0  . spironolactone (ALDACTONE) 25 MG tablet TAKE 1 TABLET BY MOUTH ONCE A DAY. (Patient taking differently: TAKE 1 TABLET BY MOUTH ONCE A DAILY AT 1300.) 30 tablet 5  . zolpidem (AMBIEN) 10 MG tablet TAKE 1 TABLET BY MOUTH AT BEDTIME AS NEEDED FOR SLEEP. (Patient taking differently: TAKE 1 TABLET BY MOUTH AT BEDTIME FOR SLEEP.) 90 tablet 0  . acetaminophen (TYLENOL) 500 MG tablet Take 500-1,000 mg by mouth every 6 (six) hours as needed (FOR PAIN.).    Marland Kitchen aspirin EC 81 MG tablet Take 81 mg by mouth daily at 12 noon. (1300)     No current facility-administered medications for this encounter.    BP (!) 114/57   Pulse 60   Wt 181 lb (82.1 kg)   SpO2 98%   BMI 39.17 kg/m  General: NAD Neck: No JVD, no thyromegaly or thyroid nodule.  Lungs: Clear to auscultation bilaterally with normal respiratory effort. CV: Nondisplaced PMI.  Heart regular S1/S2, no S3/S4, no murmur.  No peripheral edema.  No carotid bruit.  Normal pedal pulses.  Abdomen: Soft, nontender, no hepatosplenomegaly, no distention.  Skin: Intact without lesions or rashes.  Neurologic: Alert and oriented x 3.  Psych: Normal affect. Extremities: No clubbing or cyanosis.  HEENT: Normal.   Assessment/Plan: 1. Chronic systolic CHF: Long-standing cardiomyopathy, since at least 2014, but EF has worsened recently.  Cause is uncertain.  Could be due to epirubicin exposure with 2008 breast cancer treatment.  However, she has never had an ischemic workup.  Troponin was mildly elevated when she was hospitalized in 1/18. Euvolemic on exam today, NYHA class II-III.  I think that her weight gain is primarily caloric  rather than due to fluid overload.  - Continue Toprol XL and spironolactone.  - Creatinine rose with increase in Entresto to 49/52 bid, so keep at 24/26 bid.  BMET today.  - I think we need to rule out coronary disease, especially with need for breast surgery.  I will do a right and left heart catheterization later this week.  I discussed risks/benefits of cath and she agrees to proceed.   - If cath does not show significant coronary disease, I will arrange for a cardiac MRI to assess for myocarditis, infiltrative disease.  If EF remains low, she will need an ICD (not candidate for CRT with narrow QRS.  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  Continue Toprol XL.  3. Breast cancer: She needs a lumpectomy for excision of complex sclerosing  lesion.  She is clinically stable, can climb a flight of steps without trouble.  I think that she could go forward with surgery.  As I am going to do her cath this week, would probably hold off on surgery until after cath.   Loralie Champagne 07/09/2017

## 2017-07-13 ENCOUNTER — Other Ambulatory Visit: Payer: Self-pay | Admitting: General Surgery

## 2017-07-13 ENCOUNTER — Encounter (HOSPITAL_COMMUNITY): Payer: Self-pay | Admitting: Cardiology

## 2017-07-13 DIAGNOSIS — R928 Other abnormal and inconclusive findings on diagnostic imaging of breast: Secondary | ICD-10-CM

## 2017-07-13 MED FILL — Lidocaine HCl Local Inj 2%: INTRAMUSCULAR | Qty: 10 | Status: AC

## 2017-07-21 ENCOUNTER — Ambulatory Visit (HOSPITAL_COMMUNITY)
Admission: RE | Admit: 2017-07-21 | Discharge: 2017-07-21 | Disposition: A | Discharge status: Home / Self Care | Payer: Medicare Other | Source: Ambulatory Visit | Attending: Cardiology | Admitting: Cardiology

## 2017-07-21 ENCOUNTER — Encounter (HOSPITAL_COMMUNITY): Payer: Self-pay | Admitting: Cardiology

## 2017-07-21 VITALS — BP 107/54 | HR 58 | Wt 180.2 lb

## 2017-07-21 DIAGNOSIS — I429 Cardiomyopathy, unspecified: Secondary | ICD-10-CM | POA: Diagnosis not present

## 2017-07-21 DIAGNOSIS — Z9221 Personal history of antineoplastic chemotherapy: Secondary | ICD-10-CM | POA: Insufficient documentation

## 2017-07-21 DIAGNOSIS — Z853 Personal history of malignant neoplasm of breast: Secondary | ICD-10-CM | POA: Insufficient documentation

## 2017-07-21 DIAGNOSIS — Z79899 Other long term (current) drug therapy: Secondary | ICD-10-CM | POA: Diagnosis not present

## 2017-07-21 DIAGNOSIS — I5022 Chronic systolic (congestive) heart failure: Secondary | ICD-10-CM | POA: Insufficient documentation

## 2017-07-21 DIAGNOSIS — K219 Gastro-esophageal reflux disease without esophagitis: Secondary | ICD-10-CM | POA: Insufficient documentation

## 2017-07-21 DIAGNOSIS — I471 Supraventricular tachycardia: Secondary | ICD-10-CM | POA: Diagnosis not present

## 2017-07-21 DIAGNOSIS — Z923 Personal history of irradiation: Secondary | ICD-10-CM | POA: Diagnosis not present

## 2017-07-21 DIAGNOSIS — E785 Hyperlipidemia, unspecified: Secondary | ICD-10-CM | POA: Insufficient documentation

## 2017-07-21 DIAGNOSIS — I38 Endocarditis, valve unspecified: Secondary | ICD-10-CM | POA: Diagnosis not present

## 2017-07-21 DIAGNOSIS — Z7982 Long term (current) use of aspirin: Secondary | ICD-10-CM | POA: Insufficient documentation

## 2017-07-21 DIAGNOSIS — Z800 Family history of malignant neoplasm of digestive organs: Secondary | ICD-10-CM | POA: Insufficient documentation

## 2017-07-21 LAB — BASIC METABOLIC PANEL
Anion gap: 6 (ref 5–15)
BUN: 34 mg/dL — ABNORMAL HIGH (ref 6–20)
CHLORIDE: 110 mmol/L (ref 101–111)
CO2: 25 mmol/L (ref 22–32)
Calcium: 9.1 mg/dL (ref 8.9–10.3)
Creatinine, Ser: 1.52 mg/dL — ABNORMAL HIGH (ref 0.44–1.00)
GFR calc non Af Amer: 33 mL/min — ABNORMAL LOW (ref >60)
GFR, EST AFRICAN AMERICAN: 39 mL/min — AB (ref >60)
Glucose, Bld: 102 mg/dL — ABNORMAL HIGH (ref 65–99)
Potassium: 4.9 mmol/L (ref 3.5–5.1)
SODIUM: 141 mmol/L (ref 135–145)

## 2017-07-21 MED ORDER — METOPROLOL SUCCINATE ER 50 MG PO TB24: ORAL_TABLET | ORAL | 4 refills | Status: DC

## 2017-07-21 NOTE — Patient Instructions (Signed)
Increase Metoprolol 50 mg (1 tab) in AM, then 25 mg (1/2 tab) in PM  Your physician has requested that you have a cardiac MRI. Cardiac MRI uses a computer to create images of your heart as its beating, producing both still and moving pictures of your heart and major blood vessels. For further information please visit [email or web address] Please follow the instruction sheet given to you today for more information.  You have been referred to Cardiac Rehab at [facility] they will call you  Labs drawn today (if we do not call you, then your lab work was stable)   Your physician recommends that you schedule a follow-up appointment in: 2 months with Dr. Aundra Dubin

## 2017-07-22 ENCOUNTER — Other Ambulatory Visit: Payer: Self-pay | Admitting: Family Medicine

## 2017-07-22 ENCOUNTER — Telehealth (HOSPITAL_COMMUNITY): Payer: Self-pay | Admitting: *Deleted

## 2017-07-22 NOTE — Telephone Encounter (Signed)
Josem Kaufmann #[id number] exp 08/20/17  Message sent to [facility] to schedule appt.

## 2017-07-22 NOTE — Progress Notes (Signed)
PCP: Dr. Moshe Cipro Cardiology: Dr. Rayann Heman HF Cardiology: Dr. Aundra Dubin  71 yo returns for followup of CHF.   Dr. Rayann Heman has followed her for a short RP SVT.  This has been quiescent recently on Toprol XL.  No palpitations.   Patient was initially found to have low EF in 2014. Repeat echo in 6/17 showed EF 35-40%. Cardiolite in 8/17 showed fixed apical defect but no evidence for ischemia. She was admitted in 1/18 with PNA, DVT, and CHF exacerbation.  Echo at that time showed EF down to 20-25%.  Troponin was mildly elevated to around 0.6. She was diuresed and discharged.   Patient was diagnosed with breast cancer in 2008 and had lumpectomy, radiation, and chemotherapy which included epirubicin, and anthracycline.  This year, she had a mammogram with calcifications in the right breast and biopsy showed a complex sclerosing lesion.  She needs lumpectomy.   She had RHC/LHC in 10/18 showing no significant CAD and mildly elevate PCWP.   Weight down 1 lb.  Overall, feeling pretty good.  She is short of breath walking up hills but has no problems walking on flat ground.  No lightheadedness.  No orthopnea/PND.  No chest pain.   Labs (7/18): K 4.3, creatinine 1.16, TSH normal, LDL 101 Labs (8/18): K 5.3, creatinine 1.45 Labs (10/18): K 4.3, creatinine 1.26  PMH: 1. 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  2. DVT in 1/18 in setting of PNA.  She was on Xarelto for about 6 months.  3. Breast cancer: Diagnosed on right in 2008.  She had lumpectomy, radiation, and chemotherapy.  She had fluorouracil, epirubicin, Cytoxan.  - Recent mammogram with right breast calcifications => biopsy with complex sclerosing lesion.  4. GERD 5. Hyperlipidemia 6. Chronic systolic CHF: Uncertain cause of cardiomyopathy.  Echo in 2014 showed EF 40%.  It is possible that it is related to epirubicin use (anthracycline).  - Echo (6/17): EF 35-40%, mild LV dilation.  - Cardiolite (8/17): no ischemia, apical lateral/apical fixed  perfusion defect.  - Echo (1/18): EF 20-25%, moderate MR, severe TR, D-shaped IV septum, PASP 35 mmHg.  - RHC/LHC (10/18): No angiographic CAD.  Mean RA 5, PA 44/11 mean 26, mean PCWP 18, CI 3.49.   FH: Colon cancer.  No known heart disease.   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    ROS: All systems reviewed and negative except as per HPI.   Current Outpatient Prescriptions  Medication Sig Dispense Refill  . acetaminophen (TYLENOL) 500 MG tablet Take 500-1,000 mg by mouth every 6 (six) hours as needed (FOR PAIN.).    Marland Kitchen aspirin EC 81 MG tablet Take 81 mg by mouth daily at 12 noon. (1300)    . azelastine (ASTELIN) 0.1 % nasal spray Place 2 sprays into both nostrils 2 (two) times daily. Use in each nostril as directed 30 mL 12  . clonazePAM (KLONOPIN) 0.5 MG tablet TAKE (1) TABLET BY MOUTH AT BEDTIME. 90 tablet 1  . docusate sodium (COLACE) 100 MG capsule Take 200 mg by mouth 2 (two) times daily.     . folic acid (FOLVITE) 1 MG tablet Take 1 tablet (1 mg total) by mouth daily. 100 tablet 3  . lovastatin (MEVACOR) 40 MG tablet Take 1 tablet (40 mg total) by mouth at bedtime. 90 tablet 1  . metoCLOPramide (REGLAN) 10 MG tablet TAKE ONE TABLET BY MOUTH TWICE DAILY. 180 tablet 1  . metoprolol succinate (TOPROL-XL) 50 MG 24 hr  tablet 1 tab in AM, then 1/2 tab in PM 45 tablet 4  . montelukast (SINGULAIR) 10 MG tablet TAKE ONE TABLET BY MOUTH AT BEDTIME. 30 tablet 0  . pantoprazole (PROTONIX) 40 MG tablet TAKE (1) TABLET BY MOUTH TWICE DAILY. 180 tablet 0  . sacubitril-valsartan (ENTRESTO) 24-26 MG Take 1 tablet by mouth 2 (two) times daily. 60 tablet 3  . sertraline (ZOLOFT) 50 MG tablet TAKE 1 TABLET BY MOUTH ONCE A DAY. 90 tablet 0  . spironolactone (ALDACTONE) 25 MG tablet TAKE 1 TABLET BY MOUTH ONCE A DAY. 30 tablet 5  . zolpidem (AMBIEN) 10 MG tablet TAKE 1 TABLET BY MOUTH AT BEDTIME AS NEEDED FOR SLEEP. 90 tablet 0   No current facility-administered medications  for this encounter.    BP (!) 107/54   Pulse (!) 58   Wt 180 lb 4 oz (81.8 kg)   SpO2 100%   BMI 39.01 kg/m  General: NAD Neck: No JVD, no thyromegaly or thyroid nodule.  Lungs: Clear to auscultation bilaterally with normal respiratory effort. CV: Nondisplaced PMI.  Heart regular S1/S2, no S3/S4, no murmur.  No peripheral edema.  No carotid bruit.  Normal pedal pulses. 2+ left radial pulse (cath site).  Abdomen: Soft, nontender, no hepatosplenomegaly, no distention.  Skin: Intact without lesions or rashes.  Neurologic: Alert and oriented x 3.  Psych: Normal affect. Extremities: No clubbing or cyanosis.  HEENT: Normal.   Assessment/Plan: 1. Chronic systolic CHF: Long-standing cardiomyopathy, since at least 2014, but EF has worsened recently.  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  Could be due to epirubicin exposure with 2008 breast cancer treatment.  Euvolemic on exam today, NYHA class II symptoms.  Mild elevation in PCWP with normal RA pressure on RHC in 10/18.   - Continue spironolactone.  - Increase Toprol XL to 50 qam/25 qpm.  - Creatinine rose with increase in Entresto to 49/52 bid, so keep at 24/26 bid.  BMET today.  - I will arrange for a cardiac MRI to assess for myocarditis, infiltrative disease.  If EF remains low, she will need an ICD (not candidate for CRT with narrow QRS).  - I would like her to do cardiac rehab at [facility], will refer.  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  Continue Toprol XL.  3. Breast cancer: She needs a lumpectomy for excision of complex sclerosing lesion.  She is clinically stable, can climb a flight of steps without much trouble.  No coronary disease on 10/18 cath.  I think that she could go forward with surgery, continue beta blocker peri-operatively and would do the surgery at [facility].    Loralie Champagne 07/22/2017

## 2017-07-23 ENCOUNTER — Other Ambulatory Visit (HOSPITAL_COMMUNITY): Payer: Self-pay

## 2017-07-23 ENCOUNTER — Other Ambulatory Visit: Payer: Self-pay | Admitting: General Surgery

## 2017-07-23 DIAGNOSIS — R928 Other abnormal and inconclusive findings on diagnostic imaging of breast: Secondary | ICD-10-CM

## 2017-07-23 NOTE — Pre-Procedure Instructions (Signed)
KAIRI HARSHBARGER  07/23/2017      [facility] - [place], [place] [address] Phone: [phone] Fax: [phone]  [place], [address] AT [place] [phone] [place] [address] Phone: [phone] Fax: [phone]  [place], [address] AT [place] [address] [place] [address] Phone: [phone] Fax: [phone]    Your procedure is scheduled on November 7  Report to [place] at [place].M.  Call this number if you have problems the morning of surgery:  [phone]   Remember:  Do not eat food or drink liquids after midnight.  Continue all other medications as directed by your physician except follow these medication instructions before surgery   Take these medicines the morning of surgery with A SIP OF WATER  acetaminophen (TYLENOL)  metoCLOPramide (REGLAN) metoprolol succinate (TOPROL-XL) pantoprazole (PROTONIX)  sertraline (ZOLOFT)    7 days prior to surgery STOP taking any Aspirin (unless otherwise instructed by your surgeon), Aleve, Naproxen, Ibuprofen, Motrin, Advil, Goody's, BC's, all herbal medications, fish oil, and all vitamins    Do not wear jewelry, make-up or nail polish.  Do not wear lotions, powders, or perfumes, or deoderant.  Do not shave 48 hours prior to surgery.    Do not bring valuables to the hospital.  [facility] is not responsible for any belongings or valuables.  Contacts, dentures or bridgework may not be worn into surgery.  Leave your suitcase in the car.  After surgery it may be brought to your room.  For patients admitted to the hospital, discharge time will be determined by your treatment team.  Patients discharged the day of surgery will not be allowed to drive home.    Special instructions:   [facility]- Preparing For Surgery  Before surgery, you can play an important role. Because skin is not sterile, your skin needs to be as free of germs as possible. You can reduce the number of germs on your skin by washing with CHG (chlorahexidine gluconate) Soap before surgery.  CHG is an antiseptic cleaner which kills germs and bonds with the skin to continue killing germs even after washing.  Please do not use if you have an allergy to CHG or antibacterial soaps. If your skin becomes reddened/irritated stop using the CHG.  Do not shave (including legs and underarms) for at least 48 hours prior to first CHG shower. It is OK to shave your face.  Please follow these instructions carefully.   1. Shower the NIGHT BEFORE SURGERY and the MORNING OF SURGERY with CHG.   2. If you chose to wash your hair, wash your hair first as usual with your normal shampoo.  3. After you shampoo, rinse your hair and body thoroughly to remove the shampoo.  4. Use CHG as you would any other liquid soap. You can apply CHG directly to the skin and wash gently with a scrungie or a clean washcloth.   5. Apply the CHG Soap to your body ONLY FROM THE NECK DOWN.  Do not use on open wounds or open sores. Avoid contact with your eyes, ears, mouth and genitals (private parts). Wash Face and genitals (private parts)  with your normal soap.  6. Wash thoroughly, paying special attention to the area where  your surgery will be performed.  7. Thoroughly rinse your body with warm water from the neck down.  8. DO NOT shower/wash with your normal soap after using and rinsing off the CHG Soap.  9. Pat yourself dry with a CLEAN TOWEL.  10. Wear CLEAN PAJAMAS to bed the night before surgery, wear comfortable clothes the morning of surgery  11. Place CLEAN SHEETS on your bed the night of your first shower and DO NOT SLEEP WITH PETS.    Day of Surgery: Do not apply any deodorants/lotions. Please wear clean clothes to the  hospital/surgery center.      Please read over the following fact sheets that you were given.

## 2017-07-24 ENCOUNTER — Encounter (HOSPITAL_COMMUNITY)
Admission: RE | Admit: 2017-07-24 | Discharge: 2017-07-24 | Disposition: A | Discharge status: Home / Self Care | Payer: Medicare Other | Source: Ambulatory Visit | Attending: General Surgery | Admitting: General Surgery

## 2017-07-24 ENCOUNTER — Encounter (HOSPITAL_COMMUNITY): Payer: Self-pay

## 2017-07-24 ENCOUNTER — Encounter: Payer: Self-pay | Admitting: Cardiology

## 2017-07-24 DIAGNOSIS — Z7982 Long term (current) use of aspirin: Secondary | ICD-10-CM | POA: Diagnosis not present

## 2017-07-24 DIAGNOSIS — I5022 Chronic systolic (congestive) heart failure: Secondary | ICD-10-CM | POA: Diagnosis not present

## 2017-07-24 DIAGNOSIS — K219 Gastro-esophageal reflux disease without esophagitis: Secondary | ICD-10-CM | POA: Diagnosis not present

## 2017-07-24 DIAGNOSIS — Z01818 Encounter for other preprocedural examination: Secondary | ICD-10-CM | POA: Insufficient documentation

## 2017-07-24 DIAGNOSIS — C50919 Malignant neoplasm of unspecified site of unspecified female breast: Secondary | ICD-10-CM | POA: Insufficient documentation

## 2017-07-24 DIAGNOSIS — I428 Other cardiomyopathies: Secondary | ICD-10-CM | POA: Insufficient documentation

## 2017-07-24 DIAGNOSIS — Z01812 Encounter for preprocedural laboratory examination: Secondary | ICD-10-CM | POA: Insufficient documentation

## 2017-07-24 DIAGNOSIS — Z79899 Other long term (current) drug therapy: Secondary | ICD-10-CM | POA: Diagnosis not present

## 2017-07-24 LAB — CBC WITH DIFFERENTIAL/PLATELET
BASOS ABS: 0 10*3/uL (ref 0.0–0.1)
Basophils Relative: 0 %
Eosinophils Absolute: 0.2 10*3/uL (ref 0.0–0.7)
Eosinophils Relative: 2 %
HEMATOCRIT: 33.4 % — AB (ref 36.0–46.0)
HEMOGLOBIN: 10.4 g/dL — AB (ref 12.0–15.0)
Lymphocytes Relative: 26 %
Lymphs Abs: 2.6 10*3/uL (ref 0.7–4.0)
MCH: 26.5 pg (ref 26.0–34.0)
MCHC: 31.1 g/dL (ref 30.0–36.0)
MCV: 85.2 fL (ref 78.0–100.0)
MONO ABS: 0.5 10*3/uL (ref 0.1–1.0)
Monocytes Relative: 5 %
NEUTROS ABS: 6.6 10*3/uL (ref 1.7–7.7)
Neutrophils Relative %: 67 %
Platelets: 263 10*3/uL (ref 150–400)
RBC: 3.92 MIL/uL (ref 3.87–5.11)
RDW: 15.2 % (ref 11.5–15.5)
WBC: 9.9 10*3/uL (ref 4.0–10.5)

## 2017-07-24 LAB — BASIC METABOLIC PANEL
ANION GAP: 8 (ref 5–15)
BUN: 32 mg/dL — ABNORMAL HIGH (ref 6–20)
CHLORIDE: 109 mmol/L (ref 101–111)
CO2: 22 mmol/L (ref 22–32)
Calcium: 9.1 mg/dL (ref 8.9–10.3)
Creatinine, Ser: 1.46 mg/dL — ABNORMAL HIGH (ref 0.44–1.00)
GFR calc Af Amer: 41 mL/min — ABNORMAL LOW (ref >60)
GFR calc non Af Amer: 35 mL/min — ABNORMAL LOW (ref >60)
GLUCOSE: 99 mg/dL (ref 65–99)
Potassium: 4.6 mmol/L (ref 3.5–5.1)
Sodium: 139 mmol/L (ref 135–145)

## 2017-07-24 LAB — APTT: aPTT: 28 seconds (ref 24–36)

## 2017-07-24 LAB — PROTIME-INR
INR: 1
Prothrombin Time: 13.1 seconds (ref 11.4–15.2)

## 2017-07-24 NOTE — Progress Notes (Signed)
SPOKE WITH WENDY, NURSE AT DR. INGRAM'S OFFICE WHO STATED IT WAS OKAY FOR PATIENT TO CONTINUE 81 MG ASPIRIN.

## 2017-07-26 NOTE — H&P (Addendum)
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 Autumn Patty at the [facility] because of an area of calcifications and complex sclerosing lesion right breast. Dr. [name] is her PCP. Dr. Truddie Coco and Tara [name] used to be her oncologists but have signed off. Dr. Rayann Heman is her cardiologist of record. Her husband is present with her throughout the encounter.      Significant past history is cancer right breast upper outer quadrant. 2008. Tara [name] performed lumpectomy and ultimately went back for a complete right axillary lymph node dissection. She states she had 3 out of 10 lymph nodes positive. Radiation therapy. Chemotherapy. Port out. This was difficult for her      Recent imaging studies show a new grouped linear branching-like calcifications in the upper inner right breast at the middle depth and stable postsurgical changes in the upper right breast. The main component  of calcifications spanned about 1.7 cm but the total  was as much as 3 cm. Stereotactic biopsy showed complex sclerosing lesion. I discussed the pathology with her. I told her that Ferdinand carries a risk of 4- 9% risk of upgrade to a malignancy in the literature and considering that she had cancer in the same quadrant of the breast the risk is probably somewhat higher. I recommended excisional biopsy of this area and she and her husband agree. Her biggest concern is that she does not want to undergo chemotherapy or radiation therapy again because that was very difficult. After lengthy discussion she agrees to the lumpectomy to clarify her diagnosis and we'll take further steps if we have to. Hopefully this is benign.       Comorbidities include surgery and chemotherapy for right  breast cancer 2008. History of SVT and nonischemic cardiomyopathy followed by Dr. Rayann Heman. . Hypertension. Anxiety and depression. Elevated BMI. Family history reveals father died of colon cancer at age 37. Mother living age 45. No history of breast or ovarian cancer. Social history reveals a lives in [place]. Married. 2 children. Denies alcohol or tobacco. 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   , ICU, EGD, and MedSurg.      We had a one-hour discussion. She agrees with lumpectomy to clarify her diagnosis. She'll be scheduled for right breast lumpectomy with radioactive seed localization. I discussed the indications, details, techniques, and numerous risk of the surgery with her and her husband. She is aware of the risk of bleeding, infection, reoperation of cancer, further cosmetic deformity, nerve damage with chronic pain or numbness, cardiac pulmonary and thromboembolic problems. She understands all these issues well. All of her questions were answered. She agrees with this plan.  Prior to scheduling we will ask Dr. Rayann Heman to provide cardiac risk assessment and clearance.   Past Surgical History  Breast Biopsy  Right. Breast Mass; Local Excision  Right. Colon Polyp Removal - Colonoscopy  Gallbladder Surgery - Laparoscopic  Hysterectomy (not due to cancer) - Partial  Sentinel Lymph Node Biopsy   Diagnostic Studies History  Colonoscopy  1-5 years ago Mammogram  within last year Pap Smear  1-5 years ago  Allergies  Aspirin *ANALGESICS - NonNarcotic*  Lisinopril *ANTIHYPERTENSIVES*  Cough. Sudafed *NASAL AGENTS - SYSTEMIC AND  TOPICAL*  Allergies Reconciled   Medication History Azelastine HCl (0.1% Solution, Nasal) Active. ClonazePAM (0.5MG  Tablet, Oral) Active. Zolpidem Tartrate (10MG  Tablet, Oral) Active. Entresto (24-26MG  Tablet, Oral) Active. Furosemide (40MG  Tablet, Oral) Active. Lovastatin (40MG  Tablet, Oral)  Active. Metoclopramide HCl (10MG  Tablet, Oral) Active. Metoprolol Succinate ER (50MG  Tablet ER 24HR, Oral) Active. Montelukast Sodium (10MG  Tablet, Oral) Active. Pantoprazole Sodium (40MG  Tablet DR, Oral) Active. Penicillin V Potassium (500MG  Tablet, Oral) Active. Sertraline HCl (50MG  Tablet, Oral) Active. Spironolactone (25MG  Tablet, Oral) Active. Xarelto (20MG  Tablet, Oral) Active. Medications Reconciled  Social History  Caffeine use  Carbonated beverages, Tea. No alcohol use  No drug use  Tobacco use  Never smoker.  Family History  Colon Cancer  Father.  Pregnancy / Birth History  Age at menarche  68 years. Age of menopause  [age] Contraceptive History  Oral contraceptives. Gravida  2 Maternal age  42-25 Para  2  Other Problems  Breast Cancer  Congestive Heart Failure  Gastroesophageal Reflux Disease  Hemorrhoids  Hypercholesterolemia  Oophorectomy  Right. Pulmonary Embolism / Blood Clot in Legs     Review of Systems General Not Present- Appetite Loss, Chills, Fatigue, Fever, Night Sweats, Weight Gain and Weight Loss. Skin Not Present- Change in Wart/Mole, Dryness, Hives, Jaundice, New Lesions, Non-Healing Wounds, Rash and Ulcer. HEENT Present- Seasonal Allergies and Wears glasses/contact lenses. Not Present- Earache, Hearing Loss, Hoarseness, Nose Bleed, Oral Ulcers, Ringing in the Ears, Sinus Pain, Sore Throat, Visual Disturbances and Yellow Eyes. Respiratory Not Present- Bloody sputum, Chronic Cough, Difficulty Breathing, Snoring and Wheezing. Breast Not Present- Breast Mass, Breast Pain, Nipple Discharge and Skin Changes. Cardiovascular Not Present- Chest Pain, Difficulty Breathing Lying Down, Leg Cramps, Palpitations, Rapid Heart Rate, Shortness of Breath and Swelling of Extremities. Gastrointestinal Present- Hemorrhoids. Not Present- Abdominal Pain, Bloating, Bloody Stool, Change in Bowel Habits, Chronic diarrhea, Constipation,  Difficulty Swallowing, Excessive gas, Gets full quickly at meals, Indigestion, Nausea, Rectal Pain and Vomiting. Female Genitourinary Not Present- Frequency, Nocturia, Painful Urination, Pelvic Pain and Urgency. Musculoskeletal Not Present- Back Pain, Joint Pain, Joint Stiffness, Muscle Pain, Muscle Weakness and Swelling of Extremities. Neurological Not Present- Decreased Memory, Fainting, Headaches, Numbness, Seizures, Tingling, Tremor, Trouble walking and Weakness. Psychiatric Not Present- Anxiety, Bipolar, Change in Sleep Pattern, Depression, Fearful and Frequent crying. Endocrine Not Present- Cold Intolerance, Excessive Hunger, Hair Changes, Heat Intolerance, Hot flashes and New Diabetes. Hematology Not Present- Blood Thinners, Easy Bruising, Excessive bleeding, Gland problems, HIV and Persistent Infections.  Vitals  Weight: 169.4 lb Height: 57in Body Surface Area: 1.68 m Body Mass Index: 36.66 kg/m  Temp.: 97.74F  Pulse: 68 (Regular)  P.OX: 96% (Room air) BP: 128/82 (Sitting, Left Arm, Standard)     Physical Exam  General Mental Status-Alert. General Appearance-Consistent with stated age. Hydration-Well hydrated. Voice-Normal.  Head and Neck Head-normocephalic, atraumatic with no lesions or palpable masses. Trachea-midline. Thyroid Gland Characteristics - normal size and consistency.  Eye Eyeball - Bilateral-Extraocular movements intact. Sclera/Conjunctiva - Bilateral-No scleral icterus.  Chest and Lung Exam Chest and lung exam reveals -quiet, even and easy respiratory effort with no use of accessory muscles and on auscultation, normal breath sounds, no adventitious sounds and normal vocal resonance. Inspection Chest Wall - Normal. Back - normal.  Breast Note: Breasts are medium size. In the right breast there is a curvilinear lumpectomy scar in the upper outer quadrant and a transverse incision in the right axilla. I don't feel any mass  in either breast. There is no other skin changes. There is  no axillary adenopathy on either side. [place]-A-Cath incision left infraclavicular area   Cardiovascular Cardiovascular examination reveals -normal heart sounds, regular rate and rhythm with no murmurs and normal pedal pulses bilaterally.  Abdomen Inspection Inspection of the abdomen reveals - No Hernias. Skin - Scar - Note: Lower midline scar well healed without obvious hernia. Palpation/Percussion Palpation and Percussion of the abdomen reveal - Soft, Non Tender, No Rebound tenderness, No Rigidity (guarding) and No hepatosplenomegaly. Auscultation Auscultation of the abdomen reveals - Bowel sounds normal.  Neurologic Neurologic evaluation reveals -alert and oriented x 3 with no impairment of recent or remote memory. Mental Status-Normal.  Musculoskeletal Normal Exam - Left-Upper Extremity Strength Normal and Lower Extremity Strength Normal. Normal Exam - Right-Upper Extremity Strength Normal and Lower Extremity Strength Normal.  Lymphatic Head & Neck  General Head & Neck Lymphatics: Bilateral - Description - Normal. Axillary  General Axillary Region: Bilateral - Description - Normal. Tenderness - Non Tender. Femoral & Inguinal  Generalized Femoral & Inguinal Lymphatics: Bilateral - Description - Normal. Tenderness - Non Tender.   Assessment & Plan   ABNORMAL MAMMOGRAM OF RIGHT BREAST (R92.8) Impression: Complex sclerosing lesion  Your recent imaging studies show suspicious area of branching calcifications in the right breast We also see surgical changes from her previous breast cancer and radiation therapy Image guided biopsy shows what is called complex sclerosing lesion There is a 5-10% chance that you have cancer in the neighborhood Your risk may be somewhat higher because of your prior history of breast cancer in that same breast We have advised excisional biopsy of this area and you have agreed  to that  you will be scheduled for right breast lumpectomy with radioactive seed localization We have discussed the indications, techniques, and risks of that surgery in detail  We will ask Dr. Rayann Heman to provide a risk assessment and clearance before we go ahead with the surgery  Do not take any blood thinners for 5 days prior to the surgery  HISTORY OF RIGHT BREAST CANCER (Z85.3) Impression: 2008. Tara [name], Tara [name], Tara [name] were involved. 3 out of 10 lymph nodes positive. Had lumpectomy and ALND; radiation therapy and chemotherapy and port has been removed. Currently NED  NONISCHEMIC CARDIOMYOPATHY (I42.8)  SVT (SUPRAVENTRICULAR TACHYCARDIA) (I47.1)  HYPERTENSION, ESSENTIAL (I10)  BMI 36.0-36.9,ADULT (Z68.36)      Edsel Petrin. Dalbert Batman, M.D., [facility] General and Minimally invasive Surgery Breast and Colorectal Surgery Office:   [phone] Pager:   [phone]

## 2017-07-27 NOTE — Progress Notes (Signed)
Anesthesia Chart Review:  Pt is a 71 year old female scheduled for R breast lumpectomy with radioactive seed localization on 07/29/2017 with Fanny Skates, MD  - PCP is [name], MD - EP cardiologist is Thompson Grayer, MD; last office visit 12/15/16 - HF cardiologist is Loralie Champagne, MD who cleared pt for surgery at last office visit 07/21/17  PMH includes:  Nonischemic cardiomyopathy, systolic CHF, SVT, breast cancer, GERD. Never smoker. BMI 39.  Medications include: ASA 81 mg, lovastatin, metoprolol, Protonix, entresto, spironolactone  BP (!) 100/48   Pulse 61   Temp 37.1 C (Oral)   Resp 18   Ht 4\' 9"  (1.448 m)   Wt 180 lb 3 oz (81.7 kg)   SpO2 97%   BMI 38.99 kg/m   Preoperative labs reviewed.    CXR 10/24/16: No evidence of acute cardiopulmonary disease.  EKG 07/10/17:  - Sinus bradycardia (57 bpm).  LAD. Cannot rule out Anterior infarct, age undetermined  R/L cardiac cath 07/10/17:  1. Mildly elevated PCWP, mild pulmonary hypertension.  2. Preserved cardiac output.  3. No significant coronary disease, nonischemic cardiomyopathy.    Echo 10/01/16:  - Left ventricle: The cavity size was normal. Systolic function was severely reduced. The estimated ejection fraction was in the range of 20% to 25%. Diffuse hypokinesis. Features are consistent with a pseudonormal left ventricular filling pattern, with concomitant abnormal relaxation and increased filling pressure (grade 2 diastolic dysfunction). - Ventricular septum: The contour showed diastolic flattening. - Mitral valve: There was moderate regurgitation. - Left atrium: The atrium was mildly dilated. - Tricuspid valve: There was severe regurgitation. - Pulmonary arteries: Systolic pressure was mildly increased. PA peak pressure: 35 mm Hg (S). - Impressions: Compared to the prior study, there has been no significant interval change.  If no changes, I anticipate pt can proceed with surgery as scheduled.   Willeen Cass,  FNP-BC [facility] Short Stay Surgical [facility]/Anesthesiology Phone: [phone] 07/27/2017 10:55 AM

## 2017-07-28 ENCOUNTER — Ambulatory Visit
Admission: RE | Admit: 2017-07-28 | Discharge: 2017-07-28 | Disposition: A | Discharge status: Home / Self Care | Payer: Medicare Other | Source: Ambulatory Visit | Attending: General Surgery | Admitting: General Surgery

## 2017-07-28 DIAGNOSIS — R921 Mammographic calcification found on diagnostic imaging of breast: Secondary | ICD-10-CM | POA: Diagnosis not present

## 2017-07-29 ENCOUNTER — Ambulatory Visit (HOSPITAL_COMMUNITY): Payer: Medicare Other | Admitting: Emergency Medicine

## 2017-07-29 ENCOUNTER — Ambulatory Visit (HOSPITAL_COMMUNITY): Payer: Medicare Other | Admitting: Certified Registered Nurse Anesthetist

## 2017-07-29 ENCOUNTER — Encounter (HOSPITAL_COMMUNITY)
Admission: RE | Disposition: A | Discharge status: Home / Self Care | Payer: Self-pay | Source: Ambulatory Visit | Attending: General Surgery

## 2017-07-29 ENCOUNTER — Encounter (HOSPITAL_COMMUNITY): Payer: Self-pay | Admitting: *Deleted

## 2017-07-29 ENCOUNTER — Ambulatory Visit
Admission: RE | Admit: 2017-07-29 | Discharge: 2017-07-29 | Disposition: A | Discharge status: Home / Self Care | Payer: Medicare Other | Source: Ambulatory Visit | Attending: General Surgery | Admitting: General Surgery

## 2017-07-29 ENCOUNTER — Ambulatory Visit (HOSPITAL_COMMUNITY)
Admission: RE | Admit: 2017-07-29 | Discharge: 2017-07-29 | Disposition: A | Discharge status: Home / Self Care | Payer: Medicare Other | Source: Ambulatory Visit | Attending: General Surgery | Admitting: General Surgery

## 2017-07-29 DIAGNOSIS — Z9889 Other specified postprocedural states: Secondary | ICD-10-CM | POA: Insufficient documentation

## 2017-07-29 DIAGNOSIS — Z90710 Acquired absence of both cervix and uterus: Secondary | ICD-10-CM | POA: Insufficient documentation

## 2017-07-29 DIAGNOSIS — Z853 Personal history of malignant neoplasm of breast: Secondary | ICD-10-CM | POA: Insufficient documentation

## 2017-07-29 DIAGNOSIS — Z800 Family history of malignant neoplasm of digestive organs: Secondary | ICD-10-CM | POA: Diagnosis not present

## 2017-07-29 DIAGNOSIS — I429 Cardiomyopathy, unspecified: Secondary | ICD-10-CM | POA: Diagnosis not present

## 2017-07-29 DIAGNOSIS — Z79899 Other long term (current) drug therapy: Secondary | ICD-10-CM | POA: Insufficient documentation

## 2017-07-29 DIAGNOSIS — Z9221 Personal history of antineoplastic chemotherapy: Secondary | ICD-10-CM | POA: Diagnosis not present

## 2017-07-29 DIAGNOSIS — I471 Supraventricular tachycardia: Secondary | ICD-10-CM | POA: Insufficient documentation

## 2017-07-29 DIAGNOSIS — N6489 Other specified disorders of breast: Secondary | ICD-10-CM | POA: Diagnosis not present

## 2017-07-29 DIAGNOSIS — Z86711 Personal history of pulmonary embolism: Secondary | ICD-10-CM | POA: Insufficient documentation

## 2017-07-29 DIAGNOSIS — K219 Gastro-esophageal reflux disease without esophagitis: Secondary | ICD-10-CM | POA: Diagnosis not present

## 2017-07-29 DIAGNOSIS — I10 Essential (primary) hypertension: Secondary | ICD-10-CM | POA: Diagnosis not present

## 2017-07-29 DIAGNOSIS — I110 Hypertensive heart disease with heart failure: Secondary | ICD-10-CM | POA: Diagnosis not present

## 2017-07-29 DIAGNOSIS — Z90721 Acquired absence of ovaries, unilateral: Secondary | ICD-10-CM | POA: Insufficient documentation

## 2017-07-29 DIAGNOSIS — I428 Other cardiomyopathies: Secondary | ICD-10-CM | POA: Insufficient documentation

## 2017-07-29 DIAGNOSIS — E7800 Pure hypercholesterolemia, unspecified: Secondary | ICD-10-CM | POA: Diagnosis not present

## 2017-07-29 DIAGNOSIS — I509 Heart failure, unspecified: Secondary | ICD-10-CM | POA: Insufficient documentation

## 2017-07-29 DIAGNOSIS — Z888 Allergy status to other drugs, medicaments and biological substances status: Secondary | ICD-10-CM | POA: Insufficient documentation

## 2017-07-29 DIAGNOSIS — Z86010 Personal history of colonic polyps: Secondary | ICD-10-CM | POA: Insufficient documentation

## 2017-07-29 DIAGNOSIS — N6031 Fibrosclerosis of right breast: Secondary | ICD-10-CM | POA: Diagnosis not present

## 2017-07-29 DIAGNOSIS — Z86718 Personal history of other venous thrombosis and embolism: Secondary | ICD-10-CM | POA: Insufficient documentation

## 2017-07-29 DIAGNOSIS — R928 Other abnormal and inconclusive findings on diagnostic imaging of breast: Secondary | ICD-10-CM

## 2017-07-29 DIAGNOSIS — E785 Hyperlipidemia, unspecified: Secondary | ICD-10-CM | POA: Diagnosis not present

## 2017-07-29 DIAGNOSIS — Z7982 Long term (current) use of aspirin: Secondary | ICD-10-CM | POA: Insufficient documentation

## 2017-07-29 DIAGNOSIS — F419 Anxiety disorder, unspecified: Secondary | ICD-10-CM | POA: Diagnosis not present

## 2017-07-29 DIAGNOSIS — F329 Major depressive disorder, single episode, unspecified: Secondary | ICD-10-CM | POA: Insufficient documentation

## 2017-07-29 DIAGNOSIS — Z923 Personal history of irradiation: Secondary | ICD-10-CM | POA: Insufficient documentation

## 2017-07-29 DIAGNOSIS — C50911 Malignant neoplasm of unspecified site of right female breast: Secondary | ICD-10-CM | POA: Diagnosis present

## 2017-07-29 DIAGNOSIS — Z886 Allergy status to analgesic agent status: Secondary | ICD-10-CM | POA: Diagnosis not present

## 2017-07-29 DIAGNOSIS — C50211 Malignant neoplasm of upper-inner quadrant of right female breast: Secondary | ICD-10-CM | POA: Diagnosis not present

## 2017-07-29 HISTORY — DX: Other abnormal and inconclusive findings on diagnostic imaging of breast: R92.8

## 2017-07-29 HISTORY — PX: BREAST LUMPECTOMY WITH RADIOACTIVE SEED LOCALIZATION: SHX6424

## 2017-07-29 SURGERY — BREAST LUMPECTOMY WITH RADIOACTIVE SEED LOCALIZATION
Anesthesia: General | Site: Breast | Laterality: Right

## 2017-07-29 MED ORDER — SODIUM CHLORIDE 0.9% FLUSH: 3.0000 mL | INTRAVENOUS | Status: DC | PRN

## 2017-07-29 MED ORDER — ONDANSETRON HCL 4 MG/2ML IJ SOLN
INTRAMUSCULAR | Status: AC
  Filled 2017-07-29: qty 2

## 2017-07-29 MED ORDER — MIDAZOLAM HCL 2 MG/2ML IJ SOLN
INTRAMUSCULAR | Status: AC
  Filled 2017-07-29: qty 2

## 2017-07-29 MED ORDER — PHENYLEPHRINE 40 MCG/ML (10ML) SYRINGE FOR IV PUSH (FOR BLOOD PRESSURE SUPPORT)
PREFILLED_SYRINGE | INTRAVENOUS | Status: DC | PRN
  Administered 2017-07-29 (×2): 40 ug via INTRAVENOUS

## 2017-07-29 MED ORDER — ROCURONIUM BROMIDE 10 MG/ML (PF) SYRINGE
PREFILLED_SYRINGE | INTRAVENOUS | Status: DC | PRN
  Administered 2017-07-29: 50 mg via INTRAVENOUS

## 2017-07-29 MED ORDER — MEPERIDINE HCL 25 MG/ML IJ SOLN
6.2500 mg | INTRAMUSCULAR | Status: DC | PRN
Start: 2017-07-29 — End: 2017-07-29

## 2017-07-29 MED ORDER — LACTATED RINGERS IV SOLN: INTRAVENOUS | Status: DC

## 2017-07-29 MED ORDER — CHLORHEXIDINE GLUCONATE CLOTH 2 % EX PADS: 6.0000 | MEDICATED_PAD | Freq: Once | CUTANEOUS | Status: DC

## 2017-07-29 MED ORDER — SODIUM CHLORIDE 0.9% FLUSH: 3.0000 mL | Freq: Two times a day (BID) | INTRAVENOUS | Status: DC

## 2017-07-29 MED ORDER — 0.9 % SODIUM CHLORIDE (POUR BTL) OPTIME
TOPICAL | Status: DC | PRN
  Administered 2017-07-29: 1000 mL

## 2017-07-29 MED ORDER — CEFAZOLIN SODIUM-DEXTROSE 2-4 GM/100ML-% IV SOLN
INTRAVENOUS | Status: AC
  Filled 2017-07-29: qty 100

## 2017-07-29 MED ORDER — ETOMIDATE 2 MG/ML IV SOLN
INTRAVENOUS | Status: DC | PRN
  Administered 2017-07-29: 14 mg via INTRAVENOUS
  Administered 2017-07-29: 2 mg via INTRAVENOUS

## 2017-07-29 MED ORDER — SUGAMMADEX SODIUM 200 MG/2ML IV SOLN
INTRAVENOUS | Status: AC
  Filled 2017-07-29: qty 2

## 2017-07-29 MED ORDER — FENTANYL CITRATE (PF) 100 MCG/2ML IJ SOLN: 25.0000 ug | INTRAMUSCULAR | Status: DC | PRN

## 2017-07-29 MED ORDER — EPHEDRINE 5 MG/ML INJ
INTRAVENOUS | Status: AC
  Filled 2017-07-29: qty 10

## 2017-07-29 MED ORDER — FENTANYL CITRATE (PF) 250 MCG/5ML IJ SOLN
INTRAMUSCULAR | Status: AC
  Filled 2017-07-29: qty 5

## 2017-07-29 MED ORDER — BUPIVACAINE-EPINEPHRINE (PF) 0.25% -1:200000 IJ SOLN
INTRAMUSCULAR | Status: AC
  Filled 2017-07-29: qty 30

## 2017-07-29 MED ORDER — CEFAZOLIN SODIUM-DEXTROSE 2-4 GM/100ML-% IV SOLN
2.0000 g | INTRAVENOUS | Status: AC
  Administered 2017-07-29: 2 g via INTRAVENOUS

## 2017-07-29 MED ORDER — SODIUM CHLORIDE 0.9 % IV SOLN: 250.0000 mL | INTRAVENOUS | Status: DC | PRN

## 2017-07-29 MED ORDER — ACETAMINOPHEN 500 MG PO TABS
ORAL_TABLET | ORAL | Status: AC
  Administered 2017-07-29: 500 mg via ORAL
  Filled 2017-07-29: qty 2

## 2017-07-29 MED ORDER — DEXAMETHASONE SODIUM PHOSPHATE 10 MG/ML IJ SOLN
INTRAMUSCULAR | Status: AC
  Filled 2017-07-29: qty 1

## 2017-07-29 MED ORDER — OXYCODONE HCL 5 MG PO TABS: 5.0000 mg | ORAL_TABLET | ORAL | Status: DC | PRN

## 2017-07-29 MED ORDER — LIDOCAINE 2% (20 MG/ML) 5 ML SYRINGE
INTRAMUSCULAR | Status: DC | PRN
  Administered 2017-07-29: 40 mg via INTRAVENOUS

## 2017-07-29 MED ORDER — EPHEDRINE SULFATE-NACL 50-0.9 MG/10ML-% IV SOSY
PREFILLED_SYRINGE | INTRAVENOUS | Status: DC | PRN
Start: 2017-07-29 — End: 2017-07-29
  Administered 2017-07-29: 5 mg via INTRAVENOUS
  Administered 2017-07-29: 10 mg via INTRAVENOUS

## 2017-07-29 MED ORDER — GABAPENTIN 300 MG PO CAPS
ORAL_CAPSULE | ORAL | Status: AC
  Administered 2017-07-29: 300 mg via ORAL
  Filled 2017-07-29: qty 1

## 2017-07-29 MED ORDER — ACETAMINOPHEN 650 MG RE SUPP
650.0000 mg | RECTAL | Status: DC | PRN
  Filled 2017-07-29: qty 1

## 2017-07-29 MED ORDER — MIDAZOLAM HCL 5 MG/5ML IJ SOLN
INTRAMUSCULAR | Status: DC | PRN
  Administered 2017-07-29: 1 mg via INTRAVENOUS

## 2017-07-29 MED ORDER — ETOMIDATE 2 MG/ML IV SOLN
INTRAVENOUS | Status: AC
  Filled 2017-07-29: qty 10

## 2017-07-29 MED ORDER — DEXAMETHASONE SODIUM PHOSPHATE 10 MG/ML IJ SOLN
INTRAMUSCULAR | Status: DC | PRN
  Administered 2017-07-29: 10 mg via INTRAVENOUS

## 2017-07-29 MED ORDER — HYDROCODONE-ACETAMINOPHEN 5-325 MG PO TABS: 1.0000 | ORAL_TABLET | Freq: Four times a day (QID) | ORAL | 0 refills | Status: DC | PRN

## 2017-07-29 MED ORDER — BUPIVACAINE-EPINEPHRINE 0.25% -1:200000 IJ SOLN
INTRAMUSCULAR | Status: DC | PRN
  Administered 2017-07-29: 10 mL

## 2017-07-29 MED ORDER — PHENYLEPHRINE 40 MCG/ML (10ML) SYRINGE FOR IV PUSH (FOR BLOOD PRESSURE SUPPORT)
PREFILLED_SYRINGE | INTRAVENOUS | Status: AC
  Filled 2017-07-29: qty 10

## 2017-07-29 MED ORDER — LACTATED RINGERS IV SOLN
INTRAVENOUS | Status: DC | PRN
  Administered 2017-07-29: 08:00:00 via INTRAVENOUS

## 2017-07-29 MED ORDER — PROPOFOL 10 MG/ML IV BOLUS
INTRAVENOUS | Status: AC
  Filled 2017-07-29: qty 20

## 2017-07-29 MED ORDER — MIDAZOLAM HCL 2 MG/2ML IJ SOLN: 0.5000 mg | Freq: Once | INTRAMUSCULAR | Status: DC | PRN

## 2017-07-29 MED ORDER — ACETAMINOPHEN 325 MG PO TABS
650.0000 mg | ORAL_TABLET | ORAL | Status: DC | PRN
  Filled 2017-07-29: qty 2

## 2017-07-29 MED ORDER — GABAPENTIN 300 MG PO CAPS
300.0000 mg | ORAL_CAPSULE | ORAL | Status: AC
  Administered 2017-07-29: 300 mg via ORAL

## 2017-07-29 MED ORDER — ONDANSETRON HCL 4 MG/2ML IJ SOLN
INTRAMUSCULAR | Status: DC | PRN
  Administered 2017-07-29: 4 mg via INTRAVENOUS

## 2017-07-29 MED ORDER — ACETAMINOPHEN 500 MG PO TABS
1000.0000 mg | ORAL_TABLET | ORAL | Status: AC
  Administered 2017-07-29: 500 mg via ORAL

## 2017-07-29 MED ORDER — FENTANYL CITRATE (PF) 100 MCG/2ML IJ SOLN
INTRAMUSCULAR | Status: DC | PRN
  Administered 2017-07-29: 50 ug via INTRAVENOUS
  Administered 2017-07-29: 25 ug via INTRAVENOUS
  Administered 2017-07-29: 50 ug via INTRAVENOUS

## 2017-07-29 MED ORDER — SUGAMMADEX SODIUM 200 MG/2ML IV SOLN
INTRAVENOUS | Status: DC | PRN
  Administered 2017-07-29: 163.2 mg via INTRAVENOUS

## 2017-07-29 MED ORDER — FENTANYL CITRATE (PF) 100 MCG/2ML IJ SOLN
25.0000 ug | INTRAMUSCULAR | Status: DC | PRN
Start: 2017-07-29 — End: 2017-07-29

## 2017-07-29 SURGICAL SUPPLY — 46 items
ADH SKN CLS APL DERMABOND .7 (GAUZE/BANDAGES/DRESSINGS) ×1
APPLIER CLIP 9.375 MED OPEN (MISCELLANEOUS) ×2
APR CLP MED 9.3 20 MLT OPN (MISCELLANEOUS) ×1
BINDER BREAST LRG (GAUZE/BANDAGES/DRESSINGS) IMPLANT
BINDER BREAST XLRG (GAUZE/BANDAGES/DRESSINGS) ×1 IMPLANT
BLADE SURG 15 STRL LF DISP TIS (BLADE) ×1 IMPLANT
BLADE SURG 15 STRL SS (BLADE) ×2
CANISTER SUCT 3000ML PPV (MISCELLANEOUS) ×2 IMPLANT
CHLORAPREP W/TINT 26ML (MISCELLANEOUS) ×2 IMPLANT
CLIP APPLIE 9.375 MED OPEN (MISCELLANEOUS) ×1 IMPLANT
COVER PROBE W GEL 5X96 (DRAPES) ×2 IMPLANT
COVER SURGICAL LIGHT HANDLE (MISCELLANEOUS) ×2 IMPLANT
DERMABOND ADVANCED (GAUZE/BANDAGES/DRESSINGS) ×1
DERMABOND ADVANCED .7 DNX12 (GAUZE/BANDAGES/DRESSINGS) ×1 IMPLANT
DEVICE DUBIN SPECIMEN MAMMOGRA (MISCELLANEOUS) ×2 IMPLANT
DRAPE CHEST BREAST 15X10 FENES (DRAPES) ×2 IMPLANT
DRAPE UTILITY XL STRL (DRAPES) ×2 IMPLANT
ELECT CAUTERY BLADE 6.4 (BLADE) ×2 IMPLANT
ELECT REM PT RETURN 9FT ADLT (ELECTROSURGICAL) ×2
ELECTRODE REM PT RTRN 9FT ADLT (ELECTROSURGICAL) ×1 IMPLANT
GAUZE SPONGE 4X4 12PLY STRL (GAUZE/BANDAGES/DRESSINGS) ×1 IMPLANT
GLOVE BIOGEL PI IND STRL 7.0 (GLOVE) IMPLANT
GLOVE BIOGEL PI INDICATOR 7.0 (GLOVE) ×1
GLOVE EUDERMIC 7 POWDERFREE (GLOVE) ×4 IMPLANT
GLOVE SURG SS PI 7.0 STRL IVOR (GLOVE) ×1 IMPLANT
GOWN STRL REUS W/ TWL LRG LVL3 (GOWN DISPOSABLE) ×1 IMPLANT
GOWN STRL REUS W/ TWL XL LVL3 (GOWN DISPOSABLE) ×1 IMPLANT
GOWN STRL REUS W/TWL LRG LVL3 (GOWN DISPOSABLE) ×2
GOWN STRL REUS W/TWL XL LVL3 (GOWN DISPOSABLE) ×2
KIT BASIN OR (CUSTOM PROCEDURE TRAY) ×2 IMPLANT
KIT MARKER MARGIN INK (KITS) ×2 IMPLANT
NDL HYPO 25GX1X1/2 BEV (NEEDLE) ×1 IMPLANT
NEEDLE HYPO 25GX1X1/2 BEV (NEEDLE) ×2 IMPLANT
NS IRRIG 1000ML POUR BTL (IV SOLUTION) ×2 IMPLANT
PACK SURGICAL SETUP 50X90 (CUSTOM PROCEDURE TRAY) ×2 IMPLANT
PAD ABD 8X10 STRL (GAUZE/BANDAGES/DRESSINGS) ×2 IMPLANT
PENCIL BUTTON HOLSTER BLD 10FT (ELECTRODE) ×2 IMPLANT
SPONGE LAP 4X18 X RAY DECT (DISPOSABLE) ×2 IMPLANT
SUT MNCRL AB 4-0 PS2 18 (SUTURE) ×2 IMPLANT
SUT SILK 2 0 SH (SUTURE) ×2 IMPLANT
SUT VIC AB 3-0 SH 18 (SUTURE) ×2 IMPLANT
SYR BULB 3OZ (MISCELLANEOUS) ×2 IMPLANT
SYR CONTROL 10ML LL (SYRINGE) ×2 IMPLANT
TOWEL OR 17X24 6PK STRL BLUE (TOWEL DISPOSABLE) ×2 IMPLANT
TUBE CONNECTING 12X1/4 (SUCTIONS) ×2 IMPLANT
YANKAUER SUCT BULB TIP NO VENT (SUCTIONS) ×2 IMPLANT

## 2017-07-29 NOTE — Discharge Instructions (Signed)
The pathology report should be completed by Friday afternoon. If we have not called you by Friday afternoon, call Dr. Darrel Hoover office and ask to speak to his nurse to get the report       [employer] Office Phone Number [phone]  BREAST BIOPSY/ PARTIAL MASTECTOMY: POST OP INSTRUCTIONS  Always review your discharge instruction sheet given to you by the facility where your surgery was performed.  IF YOU HAVE DISABILITY OR FAMILY LEAVE FORMS, YOU MUST BRING THEM TO THE OFFICE FOR PROCESSING.  DO NOT GIVE THEM TO YOUR DOCTOR.  1. A prescription for pain medication may be given to you upon discharge.  Take your pain medication as prescribed, if needed.  If narcotic pain medicine is not needed, then you may take acetaminophen (Tylenol) or ibuprofen (Advil) as needed. 2. Take your usually prescribed medications unless otherwise directed 3. If you need a refill on your pain medication, please contact your pharmacy.  They will contact our office to request authorization.  Prescriptions will not be filled after 5pm or on week-ends. 4. You should eat very light the first 24 hours after surgery, such as soup, crackers, pudding, etc.  Resume your normal diet the day after surgery. 5. Most patients will experience some swelling and bruising in the breast.  Ice packs and a good support bra will help.  Swelling and bruising can take several days to resolve.  6. It is common to experience some constipation if taking pain medication after surgery.  Increasing fluid intake and taking a stool softener will usually help or prevent this problem from occurring.  A mild laxative (Milk of Magnesia or Miralax) should be taken according to package directions if there are no bowel movements after 48 hours. 7. Unless discharge instructions indicate otherwise, you may remove your bandages 24-48 hours after surgery, and you may shower at that time.  You may have steri-strips (small skin tapes) in place  directly over the incision.  These strips should be left on the skin for 7-10 days.  If your surgeon used skin glue on the incision, you may shower in 24 hours.  The glue will flake off over the next 2-3 weeks.  Any sutures or staples will be removed at the office during your follow-up visit. 8. ACTIVITIES:  You may resume regular daily activities (gradually increasing) beginning the next day.  Wearing a good support bra or sports bra minimizes pain and swelling.  You may have sexual intercourse when it is comfortable. a. You may drive when you no longer are taking prescription pain medication, you can comfortably wear a seatbelt, and you can safely maneuver your car and apply brakes. b. RETURN TO WORK:  ______________________________________________________________________________________ 9. You should see your doctor in the office for a follow-up appointment approximately two weeks after your surgery.  Your doctors nurse will typically make your follow-up appointment when she calls you with your pathology report.  Expect your pathology report 2-3 business days after your surgery.  You may call to check if you do not hear from [place] after three days. 10. OTHER INSTRUCTIONS: _______________________________________________________________________________________________ _____________________________________________________________________________________________________________________________________ _____________________________________________________________________________________________________________________________________ _____________________________________________________________________________________________________________________________________  WHEN TO CALL YOUR DOCTOR: 1. Fever over 101.0 2. Nausea and/or vomiting. 3. Extreme swelling or bruising. 4. Continued bleeding from incision. 5. Increased pain, redness, or drainage from the incision.  The clinic staff is available to answer  your questions during regular business hours.  Please dont hesitate to call and ask to speak to one of the nurses for clinical  concerns.  If you have a medical emergency, go to the nearest emergency room or call 911.  A surgeon from [facility] is always on call at the hospital.  For further questions, please visit [email or web address]

## 2017-07-29 NOTE — Transfer of Care (Signed)
Immediate Anesthesia Transfer of Care Note  Patient: Tara [name]  Procedure(s) Performed: RIGHT BREAST LUMPECTOMY WITH RADIOACTIVE SEED LOCALIZATION (Right Breast)  Patient Location: PACU  Anesthesia Type:General  Level of Consciousness: awake, alert , oriented and patient cooperative  Airway & Oxygen Therapy: Patient Spontanous Breathing and Patient connected to nasal cannula oxygen  Post-op Assessment: Report given to RN and Post -op Vital signs reviewed and stable  Post vital signs: Reviewed and stable  Last Vitals:  Vitals:   07/29/17 0646  BP: (!) 137/21  Pulse: 62  Resp: 20  Temp: 36.6 C  SpO2: 95%    Last Pain:  Vitals:   07/29/17 0646  TempSrc: Oral         Complications: No apparent anesthesia complications

## 2017-07-29 NOTE — Op Note (Signed)
Patient Name:           Tara [name]   Date of Surgery:        07/29/2017  Pre op Diagnosis:      Complex sclerosing lesion right breast, upper inner quadrant                                       History right breast cancer, upper outer quadrant  Post op Diagnosis:    Same  Procedure:                 Right breast lumpectomy with radioactive seed localization  Surgeon:                     Edsel Petrin. Dalbert Batman, M.D., FACS  Assistant:                      OR staff  Operative Indications:         This is a 71 year old  female, retired Equities trader, she is referred by Dr. Autumn Patty at the [facility] because of an area of calcifications and complex sclerosing lesion right breast. Dr. [name] is her PCP. Dr. Truddie Coco and Dr. Lindi Adie used to be her oncologists but have signed off. Dr. Rayann Heman is her cardiologist of record. Her husband is present with her throughout the encounter.      Significant past history is cancer right breast upper outer quadrant. 2008. Marylene Buerger performed lumpectomy and ultimately went back for a complete right axillary lymph node dissection. She states she had 3 out of 10 lymph nodes positive. Radiation therapy. Chemotherapy. Port out. This was difficult for her      Recent imaging studies show a new grouped linear branching-like calcifications in the upper inner right breast at the middle depth and stable postsurgical changes in the upper right breast. The main component  of calcifications spanned about 1.7 cm but the total  was as much as 3 cm. Stereotactic biopsy showed complex sclerosing lesion. I discussed the pathology with her. I told her that [place] carries a risk of 4- 9% risk of upgrade to a malignancy in the literature and considering that she had cancer in the same quadrant of the breast the risk is probably somewhat higher. I recommended excisional biopsy of this area and she and her husband agree. Her biggest concern is that she does  not want to undergo chemotherapy or radiation therapy again because that was very difficult. After lengthy discussion she agrees to the lumpectomy to clarify her diagnosis and we'll take further steps if we have to. Hopefully this is benign.       Comorbidities include surgery and chemotherapy for right breast cancer 2008. History of SVT and nonischemic cardiomyopathy followed by Dr. Rayann Heman. . Hypertension. Anxiety and depression. Elevated BMI. Family history reveals father died of colon cancer at age 4. Mother living age 49. No history of breast or ovarian cancer.    she agrees with lumpectomy to clarify her diagnosis   Operative Findings:       the radioactive seed was in the upper inner right breast at about the 1:00 position.  Radiologist noted a 3 cm area of calcifications.  We think that we removed essentially all of the calcifications.  The specimen mammogram looked good with the radioactive seed and original biopsy clip in the center  of the specimen.  The tissues were somewhat fibrotic, presumably secondary to prior radiation therapy   Procedure in Detail:         following the induction of general endotracheal anesthesia the patient's right breast was prepped and draped in a sterile fashion.  Surgical timeout was performed.  Intravenous antibiotics were given.  0.5% Marcaine with epinephrine was used as local infiltration anesthetic.       Using the neoprobe I planned the incision.  A curvilinear skin crease incision was made in the right breast in the upper inner quadrant.  Lumpectomy was performed using electrocautery and neoprobe.  The specimen was removed and marked with silk sutures and a 6 color ink kit to orient the pathologist.  Specimen mammogram looked good.  Specimen was marked and sent to the pathology lab.  Hemostasis was excellent.  The wound was irrigated.  5 metal marker clips are placed in the walls of the lumpectomy cavity.  The breast tissues were closed in 2 separate  layers with interrupted sutures of 3-0 Vicryl and the skin closed with a running subcuticular 4-0 Monocryl and Dermabond.  Breast binder was placed and the patient taken to PACU in stable condition.  EBL 10 mL.  Counts correct.  Complications none.        Edsel Petrin. Dalbert Batman, M.D., FACS General and Minimally Invasive Surgery Breast and Colorectal Surgery    Addendum: I logged onto the [facility] website and reviewed her prescription medication history   07/29/2017 9:20 AM

## 2017-07-29 NOTE — Anesthesia Procedure Notes (Signed)
Procedure Name: Intubation Date/Time: 07/29/2017 8:35 AM Performed by: Colin Benton, CRNA Pre-anesthesia Checklist: Patient identified, Emergency Drugs available, Suction available and Patient being monitored Patient Re-evaluated:Patient Re-evaluated prior to induction Oxygen Delivery Method: Circle system utilized Preoxygenation: Pre-oxygenation with 100% oxygen Induction Type: IV induction Ventilation: Mask ventilation without difficulty Laryngoscope Size: Miller and 2 Grade View: Grade II Tube size: 7.0 mm Number of attempts: 1 Airway Equipment and Method: Stylet Placement Confirmation: ETT inserted through vocal cords under direct vision,  breath sounds checked- equal and bilateral and positive ETCO2 Secured at: 22 cm Tube secured with: Tape Dental Injury: Teeth and Oropharynx as per pre-operative assessment

## 2017-07-29 NOTE — Interval H&P Note (Signed)
History and Physical Interval Note:  07/29/2017 8:05 AM  Tara [name]  has presented today for surgery, with the diagnosis of HX OF CANCER, ABNORMAL MAMMOGRAM RIGHT BREAST  The various methods of treatment have been discussed with the patient and family. After consideration of risks, benefits and other options for treatment, the patient has consented to  Procedure(s): RIGHT BREAST LUMPECTOMY WITH RADIOACTIVE SEED LOCALIZATION (Right) as a surgical intervention .  The patient's history has been reviewed, patient examined, no change in status, stable for surgery.  I have reviewed the patient's chart and labs.  Questions were answered to the patient's satisfaction.     Adin Hector

## 2017-07-29 NOTE — Anesthesia Postprocedure Evaluation (Signed)
Anesthesia Post Note  Patient: Tara [name]  Procedure(s) Performed: RIGHT BREAST LUMPECTOMY WITH RADIOACTIVE SEED LOCALIZATION (Right Breast)     Patient location during evaluation: PACU Anesthesia Type: General Level of consciousness: awake and alert, patient cooperative and oriented Pain management: pain level controlled Vital Signs Assessment: post-procedure vital signs reviewed and stable Respiratory status: spontaneous breathing, nonlabored ventilation and respiratory function stable Cardiovascular status: blood pressure returned to baseline and stable Postop Assessment: no apparent nausea or vomiting Anesthetic complications: no    Last Vitals:  Vitals:   07/29/17 1023 07/29/17 1024  BP:  108/60  Pulse: 60 (!) 59  Resp: 17 16  Temp:  36.7 C  SpO2: 93% 93%    Last Pain:  Vitals:   07/29/17 1024  TempSrc:   PainSc: 0-No pain                 Hargis Vandyne,E. Lotoya Casella

## 2017-07-29 NOTE — Anesthesia Preprocedure Evaluation (Addendum)
Anesthesia Evaluation  Patient identified by MRN, date of birth, ID band Patient awake    Reviewed: Allergy & Precautions, NPO status , Patient's Chart, lab work & pertinent test results, reviewed documented beta blocker date and time   History of Anesthesia Complications Negative for: history of anesthetic complications  Airway Mallampati: II  TM Distance: >3 FB Neck ROM: Full    Dental  (+) Dental Advisory Given   Pulmonary COPD,  COPD inhaler,    breath sounds clear to auscultation       Cardiovascular hypertension, Pt. on medications and Pt. on home beta blockers (-) angina+ dysrhythmias Supra Ventricular Tachycardia  Rhythm:Regular Rate:Normal  10/18 cath: preserved CO, no coronary disease 1/18 ECHO: EF 20-25%, mod MR, severe TR   Neuro/Psych Anxiety Depression negative neurological ROS     GI/Hepatic Neg liver ROS, GERD  Medicated and Controlled,  Endo/Other  Morbid obesity  Renal/GU Renal InsufficiencyRenal disease (creat 1.46)     Musculoskeletal   Abdominal (+) + obese,   Peds  Hematology  (+) Blood dyscrasia (Hb 10.4), anemia ,   Anesthesia Other Findings Breast cancer: lump, chemo, XRT  Reproductive/Obstetrics                            Anesthesia Physical Anesthesia Plan  ASA: III  Anesthesia Plan: General   Post-op Pain Management:    Induction: Intravenous  PONV Risk Score and Plan: 3 and Ondansetron, Dexamethasone and Midazolam  Airway Management Planned: Oral ETT  Additional Equipment:   Intra-op Plan:   Post-operative Plan: Extubation in OR  Informed Consent: I have reviewed the patients History and Physical, chart, labs and discussed the procedure including the risks, benefits and alternatives for the proposed anesthesia with the patient or authorized representative who has indicated his/her understanding and acceptance.   Dental advisory given  Plan  Discussed with: CRNA and Surgeon  Anesthesia Plan Comments: (Plan routine monitors, GETA)        Anesthesia Quick Evaluation

## 2017-07-30 ENCOUNTER — Encounter (HOSPITAL_COMMUNITY): Payer: Self-pay | Admitting: General Surgery

## 2017-08-03 ENCOUNTER — Ambulatory Visit (INDEPENDENT_AMBULATORY_CARE_PROVIDER_SITE_OTHER): Payer: Medicare Other | Admitting: Family Medicine

## 2017-08-03 ENCOUNTER — Encounter: Payer: Self-pay | Admitting: Family Medicine

## 2017-08-03 VITALS — BP 118/68 | HR 72 | Resp 16 | Ht <= 58 in | Wt 181.0 lb

## 2017-08-03 DIAGNOSIS — E7800 Pure hypercholesterolemia, unspecified: Secondary | ICD-10-CM

## 2017-08-03 DIAGNOSIS — K219 Gastro-esophageal reflux disease without esophagitis: Secondary | ICD-10-CM | POA: Diagnosis not present

## 2017-08-03 DIAGNOSIS — J302 Other seasonal allergic rhinitis: Secondary | ICD-10-CM | POA: Diagnosis not present

## 2017-08-03 DIAGNOSIS — N183 Chronic kidney disease, stage 3 unspecified: Secondary | ICD-10-CM

## 2017-08-03 DIAGNOSIS — F411 Generalized anxiety disorder: Secondary | ICD-10-CM

## 2017-08-03 DIAGNOSIS — D539 Nutritional anemia, unspecified: Secondary | ICD-10-CM

## 2017-08-03 NOTE — Patient Instructions (Addendum)
F/u in 5 months, call if you need me before  Fasting lipid, cmp and EGFr, iron, ferritin and B12 today  You do need the flu vaccine  Thankful you are doing better  Please work on diet and exercise  Thank you  for choosing [facility]. We consider it a privelige to serve you.  Delivering excellent health care in a caring and  compassionate way is our goal.  Partnering with you,  so that together we can achieve this goal is our strategy.

## 2017-08-04 LAB — COMPLETE METABOLIC PANEL WITH GFR
AG Ratio: 1.3 (calc) (ref 1.0–2.5)
ALKALINE PHOSPHATASE (APISO): 67 U/L (ref 33–130)
ALT: 9 U/L (ref 6–29)
AST: 14 U/L (ref 10–35)
Albumin: 4 g/dL (ref 3.6–5.1)
BUN/Creatinine Ratio: 20 (calc) (ref 6–22)
BUN: 27 mg/dL — AB (ref 7–25)
CALCIUM: 9.2 mg/dL (ref 8.6–10.4)
CO2: 25 mmol/L (ref 20–32)
CREATININE: 1.32 mg/dL — AB (ref 0.60–0.93)
Chloride: 108 mmol/L (ref 98–110)
GFR, EST NON AFRICAN AMERICAN: 40 mL/min/{1.73_m2} — AB (ref >=60)
GFR, Est African American: 47 mL/min/{1.73_m2} — ABNORMAL LOW (ref >=60)
GLOBULIN: 3.2 g/dL (ref 1.9–3.7)
GLUCOSE: 88 mg/dL (ref 65–99)
Potassium: 4.8 mmol/L (ref 3.5–5.3)
SODIUM: 141 mmol/L (ref 135–146)
Total Bilirubin: 0.4 mg/dL (ref 0.2–1.2)
Total Protein: 7.2 g/dL (ref 6.1–8.1)

## 2017-08-04 LAB — LIPID PANEL
CHOL/HDL RATIO: 3.5 (calc) (ref <5.0)
CHOLESTEROL: 185 mg/dL (ref <200)
HDL: 53 mg/dL (ref >50)
LDL CHOLESTEROL (CALC): 108 mg/dL — AB
NON-HDL CHOLESTEROL (CALC): 132 mg/dL — AB (ref <130)
TRIGLYCERIDES: 127 mg/dL (ref <150)

## 2017-08-04 LAB — IRON: Iron: 98 ug/dL (ref 45–160)

## 2017-08-04 LAB — FERRITIN: Ferritin: 399 ng/mL — ABNORMAL HIGH (ref 20–288)

## 2017-08-04 LAB — VITAMIN B12: Vitamin B-12: 540 pg/mL (ref 200–1100)

## 2017-08-10 ENCOUNTER — Ambulatory Visit (HOSPITAL_COMMUNITY)
Admission: RE | Admit: 2017-08-10 | Discharge: 2017-08-10 | Disposition: A | Discharge status: Home / Self Care | Payer: Medicare Other | Source: Ambulatory Visit | Attending: Cardiology | Admitting: Cardiology

## 2017-08-10 DIAGNOSIS — I5022 Chronic systolic (congestive) heart failure: Secondary | ICD-10-CM | POA: Diagnosis not present

## 2017-08-10 DIAGNOSIS — I38 Endocarditis, valve unspecified: Secondary | ICD-10-CM | POA: Diagnosis present

## 2017-08-10 DIAGNOSIS — I429 Cardiomyopathy, unspecified: Secondary | ICD-10-CM | POA: Diagnosis not present

## 2017-08-10 MED ORDER — GADOBENATE DIMEGLUMINE 529 MG/ML IV SOLN
20.0000 mL | Freq: Once | INTRAVENOUS | Status: AC | PRN
  Administered 2017-08-10: 20 mL via INTRAVENOUS

## 2017-08-11 ENCOUNTER — Other Ambulatory Visit (HOSPITAL_COMMUNITY): Payer: Medicare Other

## 2017-08-14 ENCOUNTER — Encounter: Payer: Self-pay | Admitting: Family Medicine

## 2017-08-14 ENCOUNTER — Other Ambulatory Visit: Payer: Self-pay | Admitting: Oncology

## 2017-08-14 NOTE — Progress Notes (Signed)
   Tara [name]     MRN: [id number]      DOB: 03-May-1946   HPI Tara [name] is here for follow up and re-evaluation of chronic medical conditions, medication management and review of any available recent lab and radiology data.  Preventive health is updated, specifically  Cancer screening and Immunization.  Refuses flu vaccine despite re education Recently had right breast lumpectomy  With benign pathology  The PT denies any adverse reactions to current medications since the last visit.  There are no new concerns.  There are no specific complaints   ROS Denies recent fever or chills. Denies sinus pressure, nasal congestion, ear pain or sore throat. Denies chest congestion, productive cough or wheezing. Denies chest pains, palpitations and leg swelling Denies abdominal pain, nausea, vomiting,diarrhea or constipation.   Denies dysuria, frequency, hesitancy or incontinence. Denies joint pain, swelling and limitation in mobility. Denies headaches, seizures, numbness, or tingling. Denies depression, anxiety or insomnia. Denies skin break down or rash.   PE  BP 118/68   Pulse 72   Resp 16   Ht 4\' 9"  (1.448 m)   Wt 181 lb (82.1 kg)   SpO2 95%   BMI 39.17 kg/m   Patient alert and oriented and in no cardiopulmonary distress.  HEENT: No facial asymmetry, EOMI,   oropharynx pink and moist.  Neck supple no JVD, no mass.  Chest: Clear to auscultation bilaterally.  CVS: S1, S2 no murmurs, no S3.Regular rate.  ABD: Soft non tender.   Ext: No edema  MS: Adequate ROM spine, shoulders, hips and knees.  Skin: Intact, no ulcerations or rash noted.  Psych: Good eye contact, normal affect. Memory intact not anxious or depressed appearing.  CNS: CN 2-12 intact, power,  normal throughout.no focal deficits noted.   Assessment & Plan  Hyperlipidemia Hyperlipidemia:Low fat diet discussed and encouraged.   Lipid Panel  Lab Results  Component Value Date   CHOL 185 08/03/2017   HDL  53 08/03/2017   LDLCALC 101 (H) 03/30/2017   TRIG 127 08/03/2017   CHOLHDL 3.5 08/03/2017  Slightly elevated LDL needs to reduce fatty foods No med change     Obesity, Class II, BMI 35-39.9, with comorbidity Deteriorated. Patient re-educated about  the importance of commitment to a  minimum of 150 minutes of exercise per week.  The importance of healthy food choices with portion control discussed. Encouraged to start a food diary, count calories and to consider  joining a support group. Sample diet sheets offered. Goals set by the patient for the next several months.   Weight /BMI 08/03/2017 07/29/2017 07/24/2017  WEIGHT 181 lb 180 lb 180 lb 3 oz  HEIGHT 4\' 9"  4\' 9"  4\' 9"   BMI 39.17 kg/m2 38.95 kg/m2 38.99 kg/m2      Seasonal allergies Controlled, no change in medication   DEPRESSION Controlled, no change in medication   Anxiety state Controlled, no change in medication   GERD Controlled, no change in medication

## 2017-08-14 NOTE — Assessment & Plan Note (Signed)
Controlled, no change in medication  

## 2017-08-14 NOTE — Assessment & Plan Note (Signed)
Hyperlipidemia:Low fat diet discussed and encouraged.   Lipid Panel  Lab Results  Component Value Date   CHOL 185 08/03/2017   HDL 53 08/03/2017   LDLCALC 101 (H) 03/30/2017   TRIG 127 08/03/2017   CHOLHDL 3.5 08/03/2017  Slightly elevated LDL needs to reduce fatty foods No med change

## 2017-08-14 NOTE — Assessment & Plan Note (Signed)
Deteriorated. Patient re-educated about  the importance of commitment to a  minimum of 150 minutes of exercise per week.  The importance of healthy food choices with portion control discussed. Encouraged to start a food diary, count calories and to consider  joining a support group. Sample diet sheets offered. Goals set by the patient for the next several months.   Weight /BMI 08/03/2017 07/29/2017 07/24/2017  WEIGHT 181 lb 180 lb 180 lb 3 oz  HEIGHT 4\' 9"  4\' 9"  4\' 9"   BMI 39.17 kg/m2 38.95 kg/m2 38.99 kg/m2

## 2017-08-21 ENCOUNTER — Other Ambulatory Visit (HOSPITAL_COMMUNITY): Payer: Self-pay | Admitting: Cardiology

## 2017-08-21 ENCOUNTER — Other Ambulatory Visit: Payer: Self-pay | Admitting: Family Medicine

## 2017-08-21 NOTE — Telephone Encounter (Signed)
Seen 11 12 18

## 2017-09-01 ENCOUNTER — Other Ambulatory Visit: Payer: Self-pay | Admitting: Family Medicine

## 2017-09-11 ENCOUNTER — Other Ambulatory Visit: Payer: Self-pay | Admitting: Family Medicine

## 2017-09-22 HISTORY — PX: MASTECTOMY: SHX3

## 2017-10-23 ENCOUNTER — Other Ambulatory Visit: Payer: Self-pay | Admitting: Family Medicine

## 2017-11-20 ENCOUNTER — Other Ambulatory Visit (HOSPITAL_COMMUNITY): Payer: Self-pay | Admitting: Cardiology

## 2017-11-23 ENCOUNTER — Other Ambulatory Visit: Payer: Self-pay | Admitting: Family Medicine

## 2017-12-08 ENCOUNTER — Other Ambulatory Visit: Payer: Self-pay | Admitting: Family Medicine

## 2017-12-08 ENCOUNTER — Other Ambulatory Visit: Payer: Self-pay

## 2017-12-08 MED ORDER — CLONAZEPAM 0.5 MG PO TABS: ORAL_TABLET | ORAL | 1 refills | Status: DC

## 2017-12-14 ENCOUNTER — Other Ambulatory Visit: Payer: Self-pay | Admitting: Family Medicine

## 2017-12-15 ENCOUNTER — Other Ambulatory Visit: Payer: Self-pay

## 2017-12-15 MED ORDER — CLONAZEPAM 0.5 MG PO TABS: ORAL_TABLET | ORAL | 1 refills | Status: DC

## 2017-12-15 MED ORDER — ZOLPIDEM TARTRATE 10 MG PO TABS: 10.0000 mg | ORAL_TABLET | Freq: Every evening | ORAL | 0 refills | Status: DC | PRN

## 2017-12-18 ENCOUNTER — Telehealth: Payer: Self-pay | Admitting: Internal Medicine

## 2017-12-18 NOTE — Telephone Encounter (Signed)
Tara [name] calling with CoverMyMeds,  Calling for prior auth for [employer] 24-26 mg.  Reference key [id number].  Tara [name] is aware that Tara [name] is out of the office

## 2017-12-21 ENCOUNTER — Other Ambulatory Visit: Payer: Self-pay | Admitting: Family Medicine

## 2017-12-21 NOTE — Telephone Encounter (Addendum)
This pt is followed by Dr Aundra Dubin in our [facility]. Will forward to that clinic.

## 2017-12-21 NOTE — Telephone Encounter (Signed)
PA submitted through [facility], awaiting outcome

## 2017-12-23 NOTE — Telephone Encounter (Signed)
Received fax from [facility] stating:  "Delene Loll is on the members formula at a Tier 3 and quantity limit 60 per 30 days, there is an approved/paid clain 12/21/17."

## 2018-01-12 ENCOUNTER — Encounter: Payer: Self-pay | Admitting: Family Medicine

## 2018-01-12 ENCOUNTER — Ambulatory Visit: Payer: Medicare Other | Admitting: Family Medicine

## 2018-01-12 VITALS — BP 120/78 | HR 63 | Resp 16 | Ht <= 58 in | Wt 194.0 lb

## 2018-01-12 DIAGNOSIS — Z1231 Encounter for screening mammogram for malignant neoplasm of breast: Secondary | ICD-10-CM

## 2018-01-12 DIAGNOSIS — E785 Hyperlipidemia, unspecified: Secondary | ICD-10-CM

## 2018-01-12 DIAGNOSIS — E8881 Metabolic syndrome: Secondary | ICD-10-CM

## 2018-01-12 DIAGNOSIS — I10 Essential (primary) hypertension: Secondary | ICD-10-CM

## 2018-01-12 DIAGNOSIS — D539 Nutritional anemia, unspecified: Secondary | ICD-10-CM

## 2018-01-12 DIAGNOSIS — R7303 Prediabetes: Secondary | ICD-10-CM

## 2018-01-12 LAB — COMPLETE METABOLIC PANEL WITH GFR
AG RATIO: 1.2 (calc) (ref 1.0–2.5)
ALT: 10 U/L (ref 6–29)
AST: 15 U/L (ref 10–35)
Albumin: 3.8 g/dL (ref 3.6–5.1)
Alkaline phosphatase (APISO): 90 U/L (ref 33–130)
BILIRUBIN TOTAL: 0.4 mg/dL (ref 0.2–1.2)
BUN/Creatinine Ratio: 19 (calc) (ref 6–22)
BUN: 25 mg/dL (ref 7–25)
CHLORIDE: 110 mmol/L (ref 98–110)
CO2: 25 mmol/L (ref 20–32)
Calcium: 9.1 mg/dL (ref 8.6–10.4)
Creat: 1.32 mg/dL — ABNORMAL HIGH (ref 0.60–0.93)
GFR, Est African American: 47 mL/min/{1.73_m2} — ABNORMAL LOW (ref >=60)
GFR, Est Non African American: 40 mL/min/{1.73_m2} — ABNORMAL LOW (ref >=60)
GLUCOSE: 92 mg/dL (ref 65–99)
Globulin: 3.1 g/dL (calc) (ref 1.9–3.7)
Potassium: 4.5 mmol/L (ref 3.5–5.3)
Sodium: 142 mmol/L (ref 135–146)
Total Protein: 6.9 g/dL (ref 6.1–8.1)

## 2018-01-12 LAB — CBC
HCT: 32 % — ABNORMAL LOW (ref 35.0–45.0)
Hemoglobin: 10.7 g/dL — ABNORMAL LOW (ref 11.7–15.5)
MCH: 27 pg (ref 27.0–33.0)
MCHC: 33.4 g/dL (ref 32.0–36.0)
MCV: 80.8 fL (ref 80.0–100.0)
MPV: 10.6 fL (ref 7.5–12.5)
PLATELETS: 255 10*3/uL (ref 140–400)
RBC: 3.96 10*6/uL (ref 3.80–5.10)
RDW: 14.2 % (ref 11.0–15.0)
WBC: 8.9 10*3/uL (ref 3.8–10.8)

## 2018-01-12 LAB — LIPID PANEL
CHOL/HDL RATIO: 4 (calc) (ref <5.0)
Cholesterol: 191 mg/dL (ref <200)
HDL: 48 mg/dL — AB (ref >50)
LDL Cholesterol (Calc): 121 mg/dL (calc) — ABNORMAL HIGH
NON-HDL CHOLESTEROL (CALC): 143 mg/dL — AB (ref <130)
TRIGLYCERIDES: 113 mg/dL (ref <150)

## 2018-01-12 LAB — VITAMIN B12: VITAMIN B 12: 701 pg/mL (ref 200–1100)

## 2018-01-12 LAB — IRON: IRON: 100 ug/dL (ref 45–160)

## 2018-01-12 LAB — FOLATE

## 2018-01-12 NOTE — Patient Instructions (Addendum)
Wellness with MD ( [place] ) end June or early july , call if you need me before  Mammogram due  At breast center May 25 or after , please schedule   No more ice cream.and cookies, no chocolate or candy  Please see if you can return to cardiac rehab , In the meantime commit to walking 10 minutes 2 to 3 times every day  You are being referred to dietitian for help with weight loss  Drink  only water.  Fasting lipid, cmp and EGFr, hBA1C and CBC and anemia panel please do today Thank you  for choosing [facility]. We consider it a privelige to serve you.  Delivering excellent health care in a caring and  compassionate way is our goal.  Partnering with you,  so that together we can achieve this goal is our strategy.

## 2018-01-13 ENCOUNTER — Other Ambulatory Visit: Payer: Self-pay | Admitting: Internal Medicine

## 2018-01-13 LAB — HEMOGLOBIN A1C
EAG (MMOL/L): 7 (calc)
Hgb A1c MFr Bld: 6 % of total Hgb — ABNORMAL HIGH (ref <5.7)
Mean Plasma Glucose: 126 (calc)

## 2018-01-13 LAB — FERRITIN: FERRITIN: 220 ng/mL (ref 20–288)

## 2018-01-14 ENCOUNTER — Other Ambulatory Visit: Payer: Self-pay | Admitting: Family Medicine

## 2018-01-14 ENCOUNTER — Encounter: Payer: Self-pay | Admitting: Family Medicine

## 2018-01-20 ENCOUNTER — Other Ambulatory Visit: Payer: Self-pay | Admitting: Family Medicine

## 2018-01-25 ENCOUNTER — Encounter: Payer: Self-pay | Admitting: Family Medicine

## 2018-01-25 DIAGNOSIS — I10 Essential (primary) hypertension: Secondary | ICD-10-CM

## 2018-01-25 HISTORY — DX: Essential (primary) hypertension: I10

## 2018-01-25 NOTE — Assessment & Plan Note (Signed)
Hyperlipidemia:Low fat diet discussed and encouraged.   Lipid Panel  Lab Results  Component Value Date   CHOL 191 01/12/2018   HDL 48 (L) 01/12/2018   LDLCALC 121 (H) 01/12/2018   TRIG 113 01/12/2018   CHOLHDL 4.0 01/12/2018   Uncontrolled , needs to lower fat intake and increase exercise

## 2018-01-25 NOTE — Assessment & Plan Note (Signed)
Controlled, no change in medication DASH diet and commitment to daily physical activity for a minimum of 30 minutes discussed and encouraged, as a part of hypertension management. The importance of attaining a healthy weight is also discussed.  BP/Weight 01/12/2018 08/03/2017 07/29/2017 07/24/2017 07/21/2017 07/10/2017 38/18/4037  Systolic BP 543 606 770 340 352 481 859  Diastolic BP 78 68 60 48 54 53 57  Wt. (Lbs) 194 181 180 180.19 180.25 181 181  BMI 41.98 39.17 38.95 38.99 39.01 39.17 39.17

## 2018-01-25 NOTE — Assessment & Plan Note (Signed)
Controlled, no change in medication  

## 2018-01-25 NOTE — Progress Notes (Signed)
Tara [name]     MRN: [id number]      DOB: 03-Dec-1945   HPI [name] is here for follow up and re-evaluation of chronic medical conditions, medication management and review of any available recent lab and radiology data.  Preventive health is updated, specifically  Cancer screening and Immunization.   Questions or concerns regarding consultations or procedures which the PT has had in the interim are  addressed. The PT denies any adverse reactions to current medications since the last visit.  There are no new concerns.  There are no specific complaints   ROS Denies recent fever or chills. Denies sinus pressure, nasal congestion, ear pain or sore throat. Denies chest congestion, productive cough or wheezing. Denies chest pains, palpitations and leg swelling Denies abdominal pain, nausea, vomiting,diarrhea or constipation.   Denies dysuria, frequency, hesitancy or incontinence. Denies joint pain, swelling and limitation in mobility. Denies headaches, seizures, numbness, or tingling. Denies depression, anxiety or insomnia. Denies skin break down or rash.   PE  BP 120/78   Pulse 63   Resp 16   Ht 4\' 9"  (1.448 m)   Wt 194 lb (88 kg)   SpO2 93%   BMI 41.98 kg/m   Patient alert and oriented and in no cardiopulmonary distress.  HEENT: No facial asymmetry, EOMI,   oropharynx pink and moist.  Neck supple no JVD, no mass.  Chest: Clear to auscultation bilaterally.  CVS: S1, S2 no murmurs, no S3.Regular rate.  ABD: Soft non tender.   Ext: No edema  MS: Adequate ROM spine, shoulders, hips and knees.  Skin: Intact, no ulcerations or rash noted.  Psych: Good eye contact, normal affect. Memory intact not anxious or depressed appearing.  CNS: CN 2-12 intact, power,  normal throughout.no focal deficits noted.   Assessment & Plan  Morbid obesity ([place]) Deteriorated. Patient re-educated about  the importance of commitment to a  minimum of 150 minutes of exercise per week.    The importance of healthy food choices with portion control discussed. Encouraged to start a food diary, count calories and to consider  joining a support group. Sample diet sheets offered. Goals set by the patient for the next several months.   Weight /BMI 01/12/2018 08/03/2017 07/29/2017  WEIGHT 194 lb 181 lb 180 lb  HEIGHT 4\' 9"  4\' 9"  4\' 9"   BMI 41.98 kg/m2 39.17 kg/m2 38.95 kg/m2      Hyperlipidemia Hyperlipidemia:Low fat diet discussed and encouraged.   Lipid Panel  Lab Results  Component Value Date   CHOL 191 01/12/2018   HDL 48 (L) 01/12/2018   LDLCALC 121 (H) 01/12/2018   TRIG 113 01/12/2018   CHOLHDL 4.0 01/12/2018   Uncontrolled , needs to lower fat intake and increase exercise    Prediabetes Patient educated about the importance of limiting  Carbohydrate intake , the need to commit to daily physical activity for a minimum of 30 minutes , and to commit weight loss. The fact that changes in all these areas will reduce or eliminate all together the development of diabetes is stressed.  Deteriorated , refer to nutritionist  Diabetic Labs Latest Ref Rng & Units 01/12/2018 08/03/2017 07/24/2017 07/21/2017 07/08/2017  HbA1c <5.7 % of total Hgb 6.0(H) - - - -  Chol <200 mg/dL 191 185 - - -  HDL >50 mg/dL 48(L) 53 - - -  Calc LDL mg/dL (calc) 121(H) 108(H) - - -  Triglycerides <150 mg/dL 113 127 - - -  Creatinine 0.60 -  0.93 mg/dL 1.32(H) 1.32(H) 1.46(H) 1.52(H) 1.26(H)  GFR >60.00 mL/min - - - - -   BP/Weight 01/12/2018 08/03/2017 07/29/2017 07/24/2017 07/21/2017 07/10/2017 35/68/6168  Systolic BP 372 902 111 552 080 223 361  Diastolic BP 78 68 60 48 54 53 57  Wt. (Lbs) 194 181 180 180.19 180.25 181 181  BMI 41.98 39.17 38.95 38.99 39.01 39.17 39.17   No flowsheet data found.     DEPRESSION Controlled, no change in medication   Metabolic syndrome X The increased risk of cardiovascular disease associated with this diagnosis, and the need to consistently work  on lifestyle to change this is discussed. Following  a  heart healthy diet ,commitment to 30 minutes of exercise at least 5 days per week, as well as control of blood sugar and cholesterol , and achieving a healthy weight are all the areas to be addressed .   Hypertension Controlled, no change in medication DASH diet and commitment to daily physical activity for a minimum of 30 minutes discussed and encouraged, as a part of hypertension management. The importance of attaining a healthy weight is also discussed.  BP/Weight 01/12/2018 08/03/2017 07/29/2017 07/24/2017 07/21/2017 07/10/2017 22/44/9753  Systolic BP 005 110 211 173 567 014 103  Diastolic BP 78 68 60 48 54 53 57  Wt. (Lbs) 194 181 180 180.19 180.25 181 181  BMI 41.98 39.17 38.95 38.99 39.01 39.17 39.17

## 2018-01-25 NOTE — Assessment & Plan Note (Signed)
The increased risk of cardiovascular disease associated with this diagnosis, and the need to consistently work on lifestyle to change this is discussed. Following  a  heart healthy diet ,commitment to 30 minutes of exercise at least 5 days per week, as well as control of blood sugar and cholesterol , and achieving a healthy weight are all the areas to be addressed .  

## 2018-01-25 NOTE — Assessment & Plan Note (Signed)
Deteriorated. Patient re-educated about  the importance of commitment to a  minimum of 150 minutes of exercise per week.  The importance of healthy food choices with portion control discussed. Encouraged to start a food diary, count calories and to consider  joining a support group. Sample diet sheets offered. Goals set by the patient for the next several months.   Weight /BMI 01/12/2018 08/03/2017 07/29/2017  WEIGHT 194 lb 181 lb 180 lb  HEIGHT 4\' 9"  4\' 9"  4\' 9"   BMI 41.98 kg/m2 39.17 kg/m2 38.95 kg/m2

## 2018-01-25 NOTE — Assessment & Plan Note (Signed)
Patient educated about the importance of limiting  Carbohydrate intake , the need to commit to daily physical activity for a minimum of 30 minutes , and to commit weight loss. The fact that changes in all these areas will reduce or eliminate all together the development of diabetes is stressed.  Deteriorated , refer to nutritionist  Diabetic Labs Latest Ref Rng & Units 01/12/2018 08/03/2017 07/24/2017 07/21/2017 07/08/2017  HbA1c <5.7 % of total Hgb 6.0(H) - - - -  Chol <200 mg/dL 191 185 - - -  HDL >50 mg/dL 48(L) 53 - - -  Calc LDL mg/dL (calc) 121(H) 108(H) - - -  Triglycerides <150 mg/dL 113 127 - - -  Creatinine 0.60 - 0.93 mg/dL 1.32(H) 1.32(H) 1.46(H) 1.52(H) 1.26(H)  GFR >60.00 mL/min - - - - -   BP/Weight 01/12/2018 08/03/2017 07/29/2017 07/24/2017 07/21/2017 07/10/2017 23/76/2831  Systolic BP 517 616 073 710 626 948 546  Diastolic BP 78 68 60 48 54 53 57  Wt. (Lbs) 194 181 180 180.19 180.25 181 181  BMI 41.98 39.17 38.95 38.99 39.01 39.17 39.17   No flowsheet data found.

## 2018-01-26 ENCOUNTER — Encounter: Payer: Self-pay | Admitting: Family Medicine

## 2018-01-26 ENCOUNTER — Other Ambulatory Visit: Payer: Self-pay | Admitting: Family Medicine

## 2018-01-26 DIAGNOSIS — Z9889 Other specified postprocedural states: Secondary | ICD-10-CM

## 2018-01-26 DIAGNOSIS — Z1231 Encounter for screening mammogram for malignant neoplasm of breast: Secondary | ICD-10-CM

## 2018-02-12 ENCOUNTER — Other Ambulatory Visit: Payer: Self-pay | Admitting: Internal Medicine

## 2018-02-18 ENCOUNTER — Other Ambulatory Visit: Payer: Self-pay | Admitting: Family Medicine

## 2018-02-23 ENCOUNTER — Ambulatory Visit: Payer: Medicare Other

## 2018-02-25 ENCOUNTER — Ambulatory Visit
Admission: RE | Admit: 2018-02-25 | Discharge: 2018-02-25 | Disposition: A | Discharge status: Home / Self Care | Payer: Medicare Other | Source: Ambulatory Visit | Attending: Family Medicine | Admitting: Family Medicine

## 2018-02-25 DIAGNOSIS — Z1231 Encounter for screening mammogram for malignant neoplasm of breast: Secondary | ICD-10-CM | POA: Diagnosis not present

## 2018-02-26 ENCOUNTER — Other Ambulatory Visit: Payer: Self-pay | Admitting: Family Medicine

## 2018-02-26 DIAGNOSIS — R928 Other abnormal and inconclusive findings on diagnostic imaging of breast: Secondary | ICD-10-CM

## 2018-03-01 ENCOUNTER — Telehealth: Payer: Self-pay

## 2018-03-01 DIAGNOSIS — R7303 Prediabetes: Secondary | ICD-10-CM

## 2018-03-01 NOTE — Telephone Encounter (Signed)
-----   Message from [name], MD sent at 01/26/2018 12:08 AM EDT ----- Regarding: plkease refer to dietitian re morbid obesity wth weight gain, prediabetes and  Hyperlipidemia, thanks

## 2018-03-04 ENCOUNTER — Other Ambulatory Visit: Payer: Self-pay | Admitting: Family Medicine

## 2018-03-04 ENCOUNTER — Other Ambulatory Visit: Payer: Self-pay | Admitting: Oncology

## 2018-03-04 ENCOUNTER — Other Ambulatory Visit: Payer: Self-pay | Admitting: Internal Medicine

## 2018-03-22 ENCOUNTER — Encounter: Payer: Self-pay | Admitting: Internal Medicine

## 2018-03-22 ENCOUNTER — Other Ambulatory Visit (HOSPITAL_COMMUNITY): Payer: Self-pay | Admitting: Cardiology

## 2018-03-26 ENCOUNTER — Ambulatory Visit
Admission: RE | Admit: 2018-03-26 | Discharge: 2018-03-26 | Disposition: A | Discharge status: Home / Self Care | Payer: Medicare Other | Source: Ambulatory Visit | Attending: Family Medicine | Admitting: Family Medicine

## 2018-03-26 ENCOUNTER — Other Ambulatory Visit: Payer: Self-pay | Admitting: Family Medicine

## 2018-03-26 DIAGNOSIS — R928 Other abnormal and inconclusive findings on diagnostic imaging of breast: Secondary | ICD-10-CM

## 2018-03-26 DIAGNOSIS — N6311 Unspecified lump in the right breast, upper outer quadrant: Secondary | ICD-10-CM | POA: Diagnosis not present

## 2018-03-26 DIAGNOSIS — N6310 Unspecified lump in the right breast, unspecified quadrant: Secondary | ICD-10-CM

## 2018-03-26 DIAGNOSIS — R921 Mammographic calcification found on diagnostic imaging of breast: Secondary | ICD-10-CM | POA: Diagnosis not present

## 2018-03-30 ENCOUNTER — Encounter: Payer: Self-pay | Admitting: Family Medicine

## 2018-03-30 ENCOUNTER — Ambulatory Visit (INDEPENDENT_AMBULATORY_CARE_PROVIDER_SITE_OTHER): Payer: Medicare Other | Admitting: Family Medicine

## 2018-03-30 VITALS — BP 124/82 | HR 65 | Resp 16 | Ht <= 58 in | Wt 198.0 lb

## 2018-03-30 DIAGNOSIS — E559 Vitamin D deficiency, unspecified: Secondary | ICD-10-CM

## 2018-03-30 DIAGNOSIS — Z0000 Encounter for general adult medical examination without abnormal findings: Secondary | ICD-10-CM

## 2018-03-30 DIAGNOSIS — R7303 Prediabetes: Secondary | ICD-10-CM

## 2018-03-30 DIAGNOSIS — E8881 Metabolic syndrome: Secondary | ICD-10-CM

## 2018-03-30 DIAGNOSIS — E7800 Pure hypercholesterolemia, unspecified: Secondary | ICD-10-CM

## 2018-03-30 DIAGNOSIS — Z1331 Encounter for screening for depression: Secondary | ICD-10-CM | POA: Insufficient documentation

## 2018-03-30 DIAGNOSIS — I10 Essential (primary) hypertension: Secondary | ICD-10-CM

## 2018-03-30 NOTE — Assessment & Plan Note (Signed)
Annual wellness visit exam as documented. Counseling done  re healthy lifestyle involving commitment to 150 minutes exercise per week, heart healthy diet, and attaining healthy weight.The importance of adequate sleep also discussed. Regular seat belt use and home safety, is also discussed. Changes in health habits are decided on by the patient with goals and time frames  set for achieving them. Immunization and cancer screening needs are specifically addressed at this visit. End of life and advanced directive discussion is initiated with the patient who has been given a packet to take home and discuss with her family.Currently has  no advanced directives. Patient very motivated to changing eating habits and weight loss states her weight is excessive and is willing to pay for nutritional services to help with weight loss. Also motivated to get out of the house more regularly, spends an excessive amount of time in her home

## 2018-03-30 NOTE — Patient Instructions (Addendum)
Annual physical exam in 4 months, call if you need me before  Please work with your family on Advanced  Directives   Please change food at home, give away all unhealthy snacks and drinks ,  Do not buy , any more , only water and fruit  We will refer you to the dietitian   Start walking for two 10 minute sessions in your home twice daily  Plan to go out of your home at least once per week  Look into senior citizens activities   CBC, fasting lipid, cnmp and EGFR, TSH and vit D 1 week before next visit     Fall Prevention in the Home Falls can cause injuries and can affect people from all age groups. There are many simple things that you can do to make your home safe and to help prevent falls. What can I do on the outside of my home?  Regularly repair the edges of walkways and driveways and fix any cracks.  Remove high doorway thresholds.  Trim any shrubbery on the main path into your home.  Use bright outdoor lighting.  Clear walkways of debris and clutter, including tools and rocks.  Regularly check that handrails are securely fastened and in good repair. Both sides of any steps should have handrails.  Install guardrails along the edges of any raised decks or porches.  Have leaves, snow, and ice cleared regularly.  Use sand or salt on walkways during winter months.  In the garage, clean up any spills right away, including grease or oil spills. What can I do in the bathroom?  Use night lights.  Install grab bars by the toilet and in the tub and shower. Do not use towel bars as grab bars.  Use non-skid mats or decals on the floor of the tub or shower.  If you need to sit down while you are in the shower, use a plastic, non-slip stool.  Keep the floor dry. Immediately clean up any water that spills on the floor.  Remove soap buildup in the tub or shower on a regular basis.  Attach bath mats securely with double-sided non-slip rug tape.  Remove throw rugs and  other tripping hazards from the floor. What can I do in the bedroom?  Use night lights.  Make sure that a bedside light is easy to reach.  Do not use oversized bedding that drapes onto the floor.  Have a firm chair that has side arms to use for getting dressed.  Remove throw rugs and other tripping hazards from the floor. What can I do in the kitchen?  Clean up any spills right away.  Avoid walking on wet floors.  Place frequently used items in easy-to-reach places.  If you need to reach for something above you, use a sturdy step stool that has a grab bar.  Keep electrical cables out of the way.  Do not use floor polish or wax that makes floors slippery. If you have to use wax, make sure that it is non-skid floor wax.  Remove throw rugs and other tripping hazards from the floor. What can I do in the stairways?  Do not leave any items on the stairs.  Make sure that there are handrails on both sides of the stairs. Fix handrails that are broken or loose. Make sure that handrails are as long as the stairways.  Check any carpeting to make sure that it is firmly attached to the stairs. Fix any carpet that is loose or  worn.  Avoid having throw rugs at the top or bottom of stairways, or secure the rugs with carpet tape to prevent them from moving.  Make sure that you have a light switch at the top of the stairs and the bottom of the stairs. If you do not have them, have them installed. What are some other fall prevention tips?  Wear closed-toe shoes that fit well and support your feet. Wear shoes that have rubber soles or low heels.  When you use a stepladder, make sure that it is completely opened and that the sides are firmly locked. Have someone hold the ladder while you are using it. Do not climb a closed stepladder.  Add color or contrast paint or tape to grab bars and handrails in your home. Place contrasting color strips on the first and last steps.  Use mobility aids as  needed, such as canes, walkers, scooters, and crutches.  Turn on lights if it is dark. Replace any light bulbs that burn out.  Set up furniture so that there are clear paths. Keep the furniture in the same spot.  Fix any uneven floor surfaces.  Choose a carpet design that does not hide the edge of steps of a stairway.  Be aware of any and all pets.  Review your medicines with your healthcare provider. Some medicines can cause dizziness or changes in blood pressure, which increase your risk of falling. Talk with your health care provider about other ways that you can decrease your risk of falls. This may include working with a physical therapist or trainer to improve your strength, balance, and endurance. This information is not intended to replace advice given to you by your health care provider. Make sure you discuss any questions you have with your health care provider. Document Released: 08/29/2002 Document Revised: 02/05/2016 Document Reviewed: 10/13/2014 Elsevier Interactive Patient Education  [employer].

## 2018-03-30 NOTE — Assessment & Plan Note (Signed)
Deteriorated. Patient re-educated about  the importance of commitment to a  minimum of 150 minutes of exercise per week.  The importance of healthy food choices with portion control discussed. Encouraged to start a food diary, count calories and to consider  joining a support group. Sample diet sheets offered. Goals set by the patient for the next several months.   Weight /BMI 03/30/2018 01/12/2018 08/03/2017  WEIGHT 198 lb 194 lb 181 lb  HEIGHT 4\' 9"  4\' 9"  4\' 9"   BMI 42.85 kg/m2 41.98 kg/m2 39.17 kg/m2  needs to be referred to educator

## 2018-03-30 NOTE — Assessment & Plan Note (Signed)
Is scened for depression and she is not clinically depressed

## 2018-03-30 NOTE — Progress Notes (Signed)
Preventive Screening-Counseling & Management   Patient present here today for a Medicare annual wellness visit.   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 Never a smoker.   Risk Factors  Current exercise habits: walks as able and goes up and down 13 steps at her home and gets her physical activity in.   Dietary issues discussed: heart healthy diet encouraged, limit sweets and carbs    Cardiac risk factors: SVT, and cardiomyopathy, HTN   Depression Screen  (Note: if answer to either of the following is "Yes", a more complete depression screening is indicated)   Over the past two weeks, have you felt down, depressed or hopeless? No  Over the past two weeks, have you felt little interest or pleasure in doing things? No  Have you lost interest or pleasure in daily life? No  Do you often feel hopeless? No  Do you cry easily over simple problems? No   Activities of Daily Living  In your present state of health, do you have any difficulty performing the following activities?  Driving?: No Managing money?: No Feeding yourself?:No Getting from bed to chair?:No Climbing a flight of stairs?:No- goes up and down her stairs at home daily  Preparing food and eating?:No Bathing or showering?:No Getting dressed?:No Getting to the toilet?:No Using the toilet?:No Moving around from place to place?: No  Fall Risk Assessment In the past year have you fallen or had a near fall?:No Are you currently taking any medications that make you dizzy?:No   Hearing Difficulties: No Do you often ask people to speak up or repeat themselves?:No Do you experience ringing or noises in your ears?:No Do you have difficulty understanding soft or whispered voices?:No  Cognitive Testing  Alert? Yes Normal Appearance?Yes  Oriented to  person? Yes Place? Yes  Time? Yes  Displays appropriate judgment?Yes  Can read the correct time from a watch face? yes Are you having problems remembering things?No  Advanced Directives have been discussed with the patient?Yes, information packet given     List the Names of Other Physician/Practitioners you currently use: Dr Rayann Heman- cardio    Indicate any recent Medical Services you may have received from other than [facility] providers in the past year (date may be approximate).     Medicare Attestation  I have personally reviewed:  The patient's medical and social history  Their use of alcohol, tobacco or illicit drugs  Their current medications and supplements  The patient's functional ability including ADLs,fall risks, home safety risks, cognitive, and hearing and visual impairment  Diet and physical activities  Evidence for depression or mood disorders  The patient's weight, height, BMI, and visual acuity have been recorded in the chart. I have made referrals, counseling, and provided education to the patient based on review of the above and I have provided the patient with a written personalized care plan for preventive services.    Physical Exam BP 124/82   Pulse 65   Resp 16   Ht 4\' 9"  (1.448 m)   Wt 198 lb (89.8 kg)   SpO2 92%   BMI 42.85 kg/m    Assessment & Plan:  Medicare annual wellness visit, subsequent Annual wellness visit exam as documented. Counseling done  re healthy lifestyle involving commitment to 150 minutes exercise per week, heart healthy diet, and attaining healthy weight.The importance of  adequate sleep also discussed. Regular seat belt use and home safety, is also discussed. Changes in health habits are decided on by the patient with goals and time frames  set for achieving them. Immunization and cancer screening needs are specifically addressed at this visit. End of life and advanced directive discussion is initiated with the patient who has been  given a packet to take home and discuss with her family.Currently has  no advanced directives. Patient very motivated to changing eating habits and weight loss states her weight is excessive and is willing to pay for nutritional services to help with weight loss. Also motivated to get out of the house more regularly, spends an excessive amount of time in her home   Screening for depression Is scened for depression and she is not clinically depressed  Morbid obesity ([place]) Deteriorated. Patient re-educated about  the importance of commitment to a  minimum of 150 minutes of exercise per week.  The importance of healthy food choices with portion control discussed. Encouraged to start a food diary, count calories and to consider  joining a support group. Sample diet sheets offered. Goals set by the patient for the next several months.   Weight /BMI 03/30/2018 01/12/2018 08/03/2017  WEIGHT 198 lb 194 lb 181 lb  HEIGHT 4\' 9"  4\' 9"  4\' 9"   BMI 42.85 kg/m2 41.98 kg/m2 39.17 kg/m2  needs to be referred to educator

## 2018-03-31 ENCOUNTER — Encounter: Payer: Self-pay | Admitting: Internal Medicine

## 2018-03-31 ENCOUNTER — Ambulatory Visit: Payer: Medicare Other | Admitting: Internal Medicine

## 2018-03-31 VITALS — BP 124/64 | HR 55 | Ht <= 58 in | Wt 197.0 lb

## 2018-03-31 DIAGNOSIS — I5022 Chronic systolic (congestive) heart failure: Secondary | ICD-10-CM | POA: Diagnosis not present

## 2018-03-31 DIAGNOSIS — I471 Supraventricular tachycardia: Secondary | ICD-10-CM

## 2018-03-31 DIAGNOSIS — I10 Essential (primary) hypertension: Secondary | ICD-10-CM

## 2018-03-31 NOTE — Patient Instructions (Addendum)
Medication Instructions:  Your physician recommends that you continue on your current medications as directed. Please refer to the Current Medication list given to you today.  Labwork: None ordered.  Testing/Procedures: None ordered.  Follow-Up: Your physician wants you to follow-up in: as needed with Dr. Allred.       Any Other Special Instructions Will Be Listed Below (If Applicable).  If you need a refill on your cardiac medications before your next appointment, please call your pharmacy.   

## 2018-03-31 NOTE — Progress Notes (Signed)
PCP: [name], MD Primary Cardiologist: Dr Aundra Dubin Primary EP: Dr Rayann Heman  Tara [name] is a 72 y.o. female who presents today for routine electrophysiology followup.  Since last being seen in our clinic, the patient reports doing very well.  Today, she denies symptoms of palpitations, chest pain, shortness of breath,  lower extremity edema, dizziness, presyncope, or syncope.  The patient is otherwise without complaint today.   Past Medical History:  Diagnosis Date  . Abnormal mammogram of right breast 07/29/2017  . 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 Anxiety   . Breast cancer [facility]) 2008   right - s/p lumpectomy->chemo, radiation  . Depression   . Dysrhythmia    hx SVT  . GERD (gastroesophageal reflux disease)   . H/O: hysterectomy   . Hiatal hernia   . History of cancer chemotherapy   . History of radiation therapy   . Hyperlipidemia   . Hypertension 01/25/2018  . Nonischemic cardiomyopathy ([place])   . Personal history of radiation therapy 01/05/209  . SVT (supraventricular tachycardia) ([place])    short RP SVT documented 5/14  . Systolic CHF [facility])    Past Surgical History:  Procedure Laterality Date  . ABDOMINAL HYSTERECTOMY  1994   fibroids,   . BIOPSY  06/19/2016   Procedure: BIOPSY;  Surgeon: Daneil Dolin, MD;  Location[facility];  Service: Endoscopy;;  gastric duodenum  . BREAST BIOPSY Right 12/16/2006   malignant  . BREAST EXCISIONAL BIOPSY Right 2018   benign lumpectomy  . BREAST LUMPECTOMY Right 02/2007  . BREAST LUMPECTOMY WITH RADIOACTIVE SEED LOCALIZATION Right 07/29/2017   Procedure: RIGHT BREAST LUMPECTOMY WITH RADIOACTIVE SEED LOCALIZATION;  Surgeon: Fanny Skates, MD;  Location: [place];  Service: General;  Laterality: Right;  . BREAST SURGERY Right 2008   lumpectomy, cancer  . CHOLECYSTECTOMY  1999  . COLONOSCOPY  2008   Dr. Oneida Alar: multiple polyps. Path not available at time of visit.   Marland Kitchen COLONOSCOPY WITH PROPOFOL N/A 04/25/2014   Dr.  Oneida Alar: Multiple tubular adenomas removed. Diverticulosis. Moderate internal hemorrhoids. Next colonoscopy planned for August 2018.  Marland Kitchen ESOPHAGOGASTRODUODENOSCOPY (EGD) WITH PROPOFOL N/A 06/19/2016   Procedure: ESOPHAGOGASTRODUODENOSCOPY (EGD) WITH PROPOFOL;  Surgeon: Daneil Dolin, MD;  [facility];  Service: Endoscopy;  Laterality: N/A;  . POLYPECTOMY N/A 04/25/2014   Procedure: POLYPECTOMY;  Surgeon: Danie Binder, MD;  Location: [facility];  Service: Endoscopy;  Laterality: N/A;  Ascending and Decending Colon x3 , Transverse colon x2, rectal  . RIGHT/LEFT HEART CATH AND CORONARY ANGIOGRAPHY N/A 07/10/2017   Procedure: RIGHT/LEFT HEART CATH AND CORONARY ANGIOGRAPHY;  Surgeon: Larey Dresser, MD;  Location: [place] [facility];  Service: Cardiovascular;  Laterality: N/A;    ROS- all systems are reviewed and negatives except as per HPI above  Current Outpatient Medications  Medication Sig Dispense Refill  . acetaminophen (TYLENOL) 500 MG tablet Take 500-1,000 mg by mouth every 6 (six) hours as needed (FOR PAIN.).    Marland Kitchen aspirin EC 81 MG tablet Take 81 mg by mouth daily at 12 noon. (1300)    . azelastine (ASTELIN) 0.1 % nasal spray Place 2 sprays into both nostrils 2 (two) times daily. Use in each nostril as directed 30 mL 12  . clonazePAM (KLONOPIN) 0.5 MG tablet 1 tablet at bedtime 90 tablet 1  . docusate sodium (COLACE) 100 MG capsule Take 200 mg by mouth 2 (two) times daily.     Marland Kitchen ENTRESTO 24-26 MG TAKE (1) TABLET  BY MOUTH TWICE DAILY. 60 tablet 2  . folic acid (FOLVITE) 1 MG tablet TAKE ONE TABLET BY MOUTH DAILY. 100 tablet 1  . lovastatin (MEVACOR) 40 MG tablet TAKE 1 TABLET BY MOUTH AT BEDTIME 90 tablet 0  . metoCLOPramide (REGLAN) 10 MG tablet TAKE ONE TABLET BY MOUTH TWICE DAILY. 180 tablet 0  . metoprolol succinate (TOPROL-XL) 50 MG 24 hr tablet TAKE 1 TABLET BY MOUTH IN THE MORNING AND 1/2 TABLET IN THE EVENING. 45 tablet 2  . montelukast (SINGULAIR) 10 MG tablet TAKE ONE  TABLET BY MOUTH AT BEDTIME. 30 tablet 3  . pantoprazole (PROTONIX) 40 MG tablet TAKE (1) TABLET BY MOUTH TWICE DAILY. 180 tablet 0  . sertraline (ZOLOFT) 50 MG tablet TAKE 1 TABLET BY MOUTH ONCE A DAY. 90 tablet 0  . spironolactone (ALDACTONE) 25 MG tablet TAKE 1 TABLET BY MOUTH ONCE A DAY. 30 tablet 0  . zolpidem (AMBIEN) 10 MG tablet TAKE 1 TABLET BY MOUTH AT BEDTIME AS NEEDED FOR SLEEP. 90 tablet 0   No current facility-administered medications for this visit.     Physical Exam: Vitals:   03/31/18 0946  BP: 124/64  Pulse: (!) 55  Weight: 197 lb (89.4 kg)  Height: 4\' 9"  (1.448 m)    GEN- The patient is well appearing, alert and oriented x 3 today.   Head- normocephalic, atraumatic Eyes-  Sclera clear, conjunctiva pink Ears- hearing intact Oropharynx- clear Lungs- Clear to ausculation bilaterally, normal work of breathing Heart- Regular rate and rhythm, no murmurs, rubs or gallops, PMI not laterally displaced GI- soft, NT, ND, + BS Extremities- no clubbing, cyanosis, or edema  Wt Readings from Last 3 Encounters:  03/31/18 197 lb (89.4 kg)  03/30/18 198 lb (89.8 kg)  01/12/18 194 lb (88 kg)    EKG tracing ordered today is personally reviewed and shows sinus bradycardia 55 bpm, PR 178 msec, QRS 92 msec, Qtc 407 msec, poor r wave progresssion, LAD, nonspecific St/T Changes  Assessment and Plan:  1. SVT Well controlled No symptomatic episodes in several years  2. Chronic systolic dysfunction/ nonischemic CM Stable Followed by CHF with ongoing medical optimization EF 49% by MRI 08/10/17  3. HTN Stable No change required today  Follows in the advanced CHF clinic Return to see EP as needed  Thompson Grayer MD, [facility] 03/31/2018 10:08 AM

## 2018-04-01 ENCOUNTER — Ambulatory Visit
Admission: RE | Admit: 2018-04-01 | Discharge: 2018-04-01 | Disposition: A | Discharge status: Home / Self Care | Payer: Medicare Other | Source: Ambulatory Visit | Attending: Family Medicine | Admitting: Family Medicine

## 2018-04-01 DIAGNOSIS — N6310 Unspecified lump in the right breast, unspecified quadrant: Secondary | ICD-10-CM

## 2018-04-01 DIAGNOSIS — C50411 Malignant neoplasm of upper-outer quadrant of right female breast: Secondary | ICD-10-CM | POA: Diagnosis not present

## 2018-04-01 DIAGNOSIS — N6311 Unspecified lump in the right breast, upper outer quadrant: Secondary | ICD-10-CM | POA: Diagnosis not present

## 2018-04-02 ENCOUNTER — Other Ambulatory Visit: Payer: Self-pay | Admitting: Internal Medicine

## 2018-04-02 ENCOUNTER — Telehealth: Payer: Self-pay

## 2018-04-02 NOTE — Telephone Encounter (Signed)
Angie from [facility] called stating the patient's biopsy of her RIGHT breast is POSITIVE. She will fax over results and they will be available in Epic. If you have any questions the direct number is [phone]

## 2018-04-02 NOTE — Telephone Encounter (Signed)
Angie called back. Patient has seen Dr.Ingram in the past. They have scheduled an appointment for her for July 18th at 11:15am

## 2018-04-02 NOTE — Telephone Encounter (Signed)
Noted, thank you

## 2018-04-09 ENCOUNTER — Other Ambulatory Visit: Payer: Self-pay | Admitting: General Surgery

## 2018-04-09 DIAGNOSIS — I428 Other cardiomyopathies: Secondary | ICD-10-CM | POA: Diagnosis not present

## 2018-04-09 DIAGNOSIS — I471 Supraventricular tachycardia: Secondary | ICD-10-CM | POA: Diagnosis not present

## 2018-04-09 DIAGNOSIS — I10 Essential (primary) hypertension: Secondary | ICD-10-CM | POA: Diagnosis not present

## 2018-04-09 DIAGNOSIS — C50911 Malignant neoplasm of unspecified site of right female breast: Secondary | ICD-10-CM | POA: Diagnosis not present

## 2018-04-12 ENCOUNTER — Telehealth: Payer: Self-pay

## 2018-04-12 NOTE — Telephone Encounter (Signed)
   [facility] Pre-operative Risk Assessment    Request for surgical clearance:  1. What type of surgery is being performed?     - RIGHT breast surgery - to remove recurrent cancer  2. When is this surgery scheduled?     - Pending  3. Are there any medications that need to be held prior to surgery and how long?    - Please advise  4. Practice name and name of physician performing surgery?     [facility]   - Dr. Dalbert Batman  5. What is your office phone and fax number?     - Phone: [phone]   - Fax: [phone] ATTN: Harriet Pho, LPN  6. Anesthesia type (None, local, MAC, general) ?    Windy Carina 04/12/2018, 4:22 PM  _________________________________________________________________   (provider comments below)

## 2018-04-14 ENCOUNTER — Encounter: Payer: Self-pay | Admitting: Hematology and Oncology

## 2018-04-14 ENCOUNTER — Telehealth: Payer: Self-pay | Admitting: Hematology and Oncology

## 2018-04-14 NOTE — Telephone Encounter (Signed)
Pt has been scheduled to see Dr. Lindi Adie on 7/31 at 4pm. Pt aware to arrive 30 minutes early. Letter mailed.

## 2018-04-15 NOTE — Telephone Encounter (Signed)
   Primary Cardiologist: No primary care provider on file.  Chart reviewed as part of pre-operative protocol coverage. Patient was contacted 04/15/2018 in reference to pre-operative risk assessment for pending surgery as outlined below.  Tara [name] was last seen on 04/01/18 by Dr. Rayann Heman.  Since that day, Tara [name] has done well.  She denies chest pain and new shortness of breath She is at her baseline activities and can complete more than 4.0 METS. She had a heart cath 06/2017 with nonobstructive disease.  Therefore, based on ACC/AHA guidelines, the patient would be at acceptable risk for the planned procedure without further cardiovascular testing.   I will route this recommendation to the requesting party via Epic fax function and remove from pre-op pool.  [phone] ATTN: Harriet Pho, LPN  Please call with questions.  Tami Lin Karne Ozga, PA 04/15/2018, 1:37 PM

## 2018-04-20 ENCOUNTER — Other Ambulatory Visit: Payer: Self-pay | Admitting: Internal Medicine

## 2018-04-21 ENCOUNTER — Inpatient Hospital Stay: Payer: Medicare Other | Attending: Hematology and Oncology | Admitting: Hematology and Oncology

## 2018-04-21 DIAGNOSIS — C50411 Malignant neoplasm of upper-outer quadrant of right female breast: Secondary | ICD-10-CM | POA: Diagnosis not present

## 2018-04-21 DIAGNOSIS — Z9221 Personal history of antineoplastic chemotherapy: Secondary | ICD-10-CM

## 2018-04-21 DIAGNOSIS — Z923 Personal history of irradiation: Secondary | ICD-10-CM | POA: Diagnosis not present

## 2018-04-21 DIAGNOSIS — Z171 Estrogen receptor negative status [ER-]: Secondary | ICD-10-CM

## 2018-04-21 DIAGNOSIS — Z170 Estrogen receptor positive status [ER+]: Secondary | ICD-10-CM | POA: Insufficient documentation

## 2018-04-21 NOTE — Progress Notes (Signed)
[place] NOTE  Patient Care Team: [name], MD as PCP - Bethena Roys, MD as Consulting Physician (Cardiology) Cottle, Billey Co., MD as Attending Physician (Psychiatry)  CHIEF COMPLAINTS/PURPOSE OF CONSULTATION:  Newly diagnosed breast cancer  HISTORY OF PRESENTING ILLNESS:  Tara [name] 72 y.o. 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  Patient has a previous history of right breast cancer triple negative disease diagnosed in 2008.  She underwent lumpectomy and axillary lymph node dissection and had 3+ lymph nodes out of 10.  She underwent adjuvant chemotherapy with FEC followed by Taxotere and then radiation.  She was doing quite well up until recent mammogram showed 3 new breast nodules.  Biopsy of this nodule came back as triple negative recurrent breast cancer.  She was seen by Dr. Dalbert Batman who recommended her to see [place].  She is a retired Marine scientist.  She is accompanied by her husband and her daughter.  I reviewed her records extensively and collaborated the history with the patient.  SUMMARY OF ONCOLOGIC HISTORY:   Malignant neoplasm of right breast ([place])   02/03/2007 Surgery    Right breast lumpectomy: Invasive ductal carcinoma 1.5 cm grade 3, 1/2 sentinel nodes positive, ER negative, PR negative, Ki-67 35%, HER-2 negative      03/08/2007 Surgery    Right axillary lymph node dissection: 2/8 lymph nodes positive      04/12/2007 - 08/13/2007 Chemotherapy    FEC and Taxotere      09/20/2007 - 11/15/2007 Radiation Therapy    Adjuvant radiation      04/01/2018 Relapse/Recurrence    3 irregular masses right breast UOQ.  Ultrasound reveals 0.9 cm, 1.3 cm, 1 cm no abnormal lymph nodes, right breast biopsy 10 o'clock position: IDC grade 3, ER 0%, PR 0%, Ki-67 60%, HER-2 negative ratio 1.74      MEDICAL HISTORY:  Past Medical History:  Diagnosis Date  . Abnormal mammogram of right  breast 07/29/2017  . 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 Anxiety   . Breast cancer [facility]) 2008   right - s/p lumpectomy->chemo, radiation  . Depression   . Dysrhythmia    hx SVT  . GERD (gastroesophageal reflux disease)   . H/O: hysterectomy   . Hiatal hernia   . History of cancer chemotherapy   . History of radiation therapy   . Hyperlipidemia   . Hypertension 01/25/2018  . Nonischemic cardiomyopathy ([place])   . Personal history of radiation therapy 01/05/209  . SVT (supraventricular tachycardia) ([place])    short RP SVT documented 5/14  . Systolic CHF [facility])     SURGICAL HISTORY: Past Surgical History:  Procedure Laterality Date  . ABDOMINAL HYSTERECTOMY  1994   fibroids,   . BIOPSY  06/19/2016   Procedure: BIOPSY;  Surgeon: Daneil Dolin, MD;  [facility];  Service: Endoscopy;;  gastric duodenum  . BREAST BIOPSY Right 12/16/2006   malignant  . BREAST EXCISIONAL BIOPSY Right 2018   benign lumpectomy  . BREAST LUMPECTOMY Right 02/2007  . BREAST LUMPECTOMY WITH RADIOACTIVE SEED LOCALIZATION Right 07/29/2017   Procedure: RIGHT BREAST LUMPECTOMY WITH RADIOACTIVE SEED LOCALIZATION;  Surgeon: Fanny Skates, MD;  Location: [place];  Service: General;  Laterality: Right;  . BREAST SURGERY Right 2008   lumpectomy, cancer  . CHOLECYSTECTOMY  1999  . COLONOSCOPY  2008   Dr. Oneida Alar: multiple polyps. Path not available at time of visit.   Marland Kitchen  COLONOSCOPY WITH PROPOFOL N/A 04/25/2014   Dr. Oneida Alar: Multiple tubular adenomas removed. Diverticulosis. Moderate internal hemorrhoids. Next colonoscopy planned for August 2018.  Marland Kitchen ESOPHAGOGASTRODUODENOSCOPY (EGD) WITH PROPOFOL N/A 06/19/2016   Procedure: ESOPHAGOGASTRODUODENOSCOPY (EGD) WITH PROPOFOL;  Surgeon: Daneil Dolin, MD;  [facility];  Service: Endoscopy;  Laterality: N/A;  . POLYPECTOMY N/A 04/25/2014   Procedure: POLYPECTOMY;  Surgeon: Danie Binder, MD;  Location: [facility];  Service: Endoscopy;  Laterality: N/A;  Ascending  and Decending Colon x3 , Transverse colon x2, rectal  . RIGHT/LEFT HEART CATH AND CORONARY ANGIOGRAPHY N/A 07/10/2017   Procedure: RIGHT/LEFT HEART CATH AND CORONARY ANGIOGRAPHY;  Surgeon: Larey Dresser, MD;  Location: [place] [facility];  Service: Cardiovascular;  Laterality: N/A;    SOCIAL HISTORY: Social History   Socioeconomic History  . Marital status: Married    Spouse name: Not on file  . Number of children: 2  . Years of education: Not on file  . Highest education level: Not on file  Occupational History  . Occupation: disabled     Fish farm manager: UNEMPLOYED  Social Needs  . Financial resource strain: Not hard at all  . Food insecurity:    Worry: Never true    Inability: Never true  . Transportation needs:    Medical: No    Non-medical: No  Tobacco Use  . Smoking status: Never Smoker  . Smokeless tobacco: Never Used  Substance and Sexual Activity  . Alcohol use: No  . Drug use: No  . Sexual activity: Not on file  Lifestyle  . Physical activity:    Days per week: 2 days    Minutes per session: 10 min  . Stress: Not at all  Relationships  . Social connections:    Talks on phone: More than three times a week    Gets together: More than three times a week    Attends religious service: More than 4 times per year    Active member of club or organization: No    Attends meetings of clubs or organizations: Not on file    Relationship status: Married  . Intimate partner violence:    Fear of current or ex partner: No    Emotionally abused: No    Physically abused: No    Forced sexual activity: No  Other Topics Concern  . Not on file  Social History Narrative   Lives in [place] with family.  Does not routinely exercise.    FAMILY HISTORY: Family History  Problem Relation Age of Onset  . Colon cancer Father 10       died at age 20  . Hypertension Sister   . Heart disease Sister   . Dementia Mother   . Stroke Mother 49       left hemiparesis  . Cancer  Brother 23       prostate    ALLERGIES:  is allergic to aspirin; lisinopril; and sudafed [pseudoephedrine hcl].  MEDICATIONS:  Current Outpatient Medications  Medication Sig Dispense Refill  . acetaminophen (TYLENOL) 500 MG tablet Take 500-1,000 mg by mouth every 6 (six) hours as needed (FOR PAIN.).    Marland Kitchen aspirin EC 81 MG tablet Take 81 mg by mouth daily at 12 noon. (1300)    . azelastine (ASTELIN) 0.1 % nasal spray Place 2 sprays into both nostrils 2 (two) times daily. Use in each nostril as directed 30 mL 12  . clonazePAM (KLONOPIN) 0.5 MG tablet 1 tablet at bedtime 90 tablet  1  . docusate sodium (COLACE) 100 MG capsule Take 200 mg by mouth 2 (two) times daily.     Marland Kitchen ENTRESTO 24-26 MG TAKE (1) TABLET BY MOUTH TWICE DAILY. 60 tablet 2  . folic acid (FOLVITE) 1 MG tablet TAKE ONE TABLET BY MOUTH DAILY. 100 tablet 1  . lovastatin (MEVACOR) 40 MG tablet TAKE 1 TABLET BY MOUTH AT BEDTIME 90 tablet 0  . metoCLOPramide (REGLAN) 10 MG tablet TAKE ONE TABLET BY MOUTH TWICE DAILY. 180 tablet 0  . metoprolol succinate (TOPROL-XL) 50 MG 24 hr tablet TAKE 1 TABLET BY MOUTH IN THE MORNING AND 1/2 TABLET IN THE EVENING. 45 tablet 2  . montelukast (SINGULAIR) 10 MG tablet TAKE ONE TABLET BY MOUTH AT BEDTIME. 30 tablet 3  . pantoprazole (PROTONIX) 40 MG tablet TAKE (1) TABLET BY MOUTH TWICE DAILY. 180 tablet 0  . sertraline (ZOLOFT) 50 MG tablet TAKE 1 TABLET BY MOUTH ONCE A DAY. 90 tablet 0  . spironolactone (ALDACTONE) 25 MG tablet TAKE 1 TABLET BY MOUTH ONCE A DAY. 30 tablet 12  . zolpidem (AMBIEN) 10 MG tablet TAKE 1 TABLET BY MOUTH AT BEDTIME AS NEEDED FOR SLEEP. 90 tablet 0   No current facility-administered medications for this visit.     REVIEW OF SYSTEMS:   Constitutional: Denies fevers, chills or abnormal night sweats Eyes: Denies blurriness of vision, double vision or watery eyes Ears, nose, mouth, throat, and face: Denies mucositis or sore throat Respiratory: Denies cough, dyspnea or  wheezes Cardiovascular: Denies palpitation, chest discomfort or lower extremity swelling Gastrointestinal:  Denies nausea, heartburn or change in bowel habits Skin: Denies abnormal skin rashes Lymphatics: Denies new lymphadenopathy or easy bruising Neurological:Denies numbness, tingling or new weaknesses Behavioral/Psych: Mood is stable, no new changes  Breast:  Denies any palpable lumps or discharge All other systems were reviewed with the patient and are negative.  PHYSICAL EXAMINATION: ECOG PERFORMANCE STATUS: 1 - Symptomatic but completely ambulatory  Vitals:   04/21/18 1555  BP: 134/62  Pulse: (!) 58  Resp: 17  Temp: 98.1 F (36.7 C)  SpO2: 97%   Filed Weights   04/21/18 1555  Weight: 195 lb (88.5 kg)    GENERAL:alert, no distress and comfortable SKIN: skin color, texture, turgor are normal, no rashes or significant lesions EYES: normal, conjunctiva are pink and non-injected, sclera clear OROPHARYNX:no exudate, no erythema and lips, buccal mucosa, and tongue normal  NECK: supple, thyroid normal size, non-tender, without nodularity LYMPH:  no palpable lymphadenopathy in the cervical, axillary or inguinal LUNGS: clear to auscultation and percussion with normal breathing effort HEART: regular rate & rhythm and no murmurs and no lower extremity edema ABDOMEN:abdomen soft, non-tender and normal bowel sounds Musculoskeletal:no cyanosis of digits and no clubbing  PSYCH: alert & oriented x 3 with fluent speech NEURO: no focal motor/sensory deficits  LABORATORY DATA:  I have reviewed the data as listed Lab Results  Component Value Date   WBC 8.9 01/12/2018   HGB 10.7 (L) 01/12/2018   HCT 32.0 (L) 01/12/2018   MCV 80.8 01/12/2018   PLT 255 01/12/2018   Lab Results  Component Value Date   NA 142 01/12/2018   K 4.5 01/12/2018   CL 110 01/12/2018   CO2 25 01/12/2018    RADIOGRAPHIC STUDIES: I have personally reviewed the radiological reports and agreed with the  findings in the report.  ASSESSMENT AND PLAN:  Malignant neoplasm of right breast Recurrence: 3 irregular masses right breast UOQ.  Ultrasound reveals 0.9  cm, 1.3 cm, 1 cm no abnormal lymph nodes, right breast biopsy 10 o'clock position: IDC grade 3, ER 0%, PR 0%, Ki-67 60%, HER-2 negative ratio 1.74  (2008: Right lumpectomy triple -1.5 cm tumor 3/10 lymph nodes positive, adjuvant chemo with FEC and Taxotere followed by radiation)  Pathology counseling: I discussed with the patient that she now has Recurrence of a triple negative breast cancer.     Recommendation:  1. Mastectomy 2. followed by adjuvant CMF Chemotherapy  The patient is very reluctant to consider any treatment options.  She asked me questions about what would happen if she were to do nothing.  She also asked me what would happen if she just did the surgery and no chemotherapy.  I answered all of her questions.  She will think about it and make her decision. In the meantime I would like to obtain staging CT scans and bone scan. If she has no distant metastatic disease then she may be willing to undergo the mastectomy procedure.  I will then see her after the surgery. If she decides to go through chemotherapy then she will need a port.    Chemo counseling: I discussed with her extensively that CMF chemotherapy times to be fairly mild and that does not cause too many side effects.  She does not have to worry about hair loss.  Nausea vomiting diarrhea constipation fatigue, decrease in blood counts are some of the known side effects. I will call her with the results of the CT scans and bone scans to be done within a week. I will discuss with Dr. Dalbert Batman about her treatment options. Return to clinic after surgery to discuss treatment plan.   All questions were answered. The patient knows to call the clinic with any problems, questions or concerns.    Harriette Ohara, MD 04/21/18

## 2018-04-21 NOTE — Assessment & Plan Note (Signed)
Recurrence: 3 irregular masses right breast UOQ.  Ultrasound reveals 0.9 cm, 1.3 cm, 1 cm no abnormal lymph nodes, right breast biopsy 10 o'clock position: IDC grade 3, ER 0%, PR 0%, Ki-67 60%, HER-2 negative ratio 1.74  (2008: Right lumpectomy triple -1.5 cm tumor 3/10 lymph nodes positive, adjuvant chemo with FEC and Taxotere followed by radiation)  Pathology counseling: I discussed with the patient that she now has Recurrence of a triple negative breast cancer.     Recommendation:  1. Mastectomy 2. followed by adjuvant CMF Chemotherapy  Return to clinic after surgery to discuss treatment plan. 

## 2018-04-22 ENCOUNTER — Telehealth: Payer: Self-pay | Admitting: Hematology and Oncology

## 2018-04-22 NOTE — Telephone Encounter (Signed)
Spoke with Triage at [facility] and she accessed the clearance note and is giving to the correct nurse.

## 2018-04-22 NOTE — Telephone Encounter (Signed)
New Message:       [facility] calling to see if we can re-fax the pt's clearance due to them not receiving it. Fax [phone] or [phone].

## 2018-04-22 NOTE — Telephone Encounter (Signed)
Per 7/31 los.  Called patient w/ appt for genetics counceling.  Unable to leave message as the patient's mailbox was full.  Mailed calendar.

## 2018-04-22 NOTE — Telephone Encounter (Signed)
Fax resent through [place]. I will also route to preop callback pool to call requesting MD office to confirm that fax was received. I will remove from preop pool.

## 2018-04-23 ENCOUNTER — Other Ambulatory Visit: Payer: Self-pay | Admitting: *Deleted

## 2018-04-23 DIAGNOSIS — C50411 Malignant neoplasm of upper-outer quadrant of right female breast: Secondary | ICD-10-CM

## 2018-04-23 DIAGNOSIS — Z171 Estrogen receptor negative status [ER-]: Secondary | ICD-10-CM

## 2018-04-27 ENCOUNTER — Encounter: Payer: Self-pay | Admitting: Family Medicine

## 2018-04-27 ENCOUNTER — Ambulatory Visit (INDEPENDENT_AMBULATORY_CARE_PROVIDER_SITE_OTHER): Payer: Medicare Other | Admitting: Family Medicine

## 2018-04-27 VITALS — BP 114/74 | HR 56 | Resp 16 | Ht <= 58 in | Wt 195.0 lb

## 2018-04-27 DIAGNOSIS — I10 Essential (primary) hypertension: Secondary | ICD-10-CM | POA: Diagnosis not present

## 2018-04-27 DIAGNOSIS — H6123 Impacted cerumen, bilateral: Secondary | ICD-10-CM | POA: Diagnosis not present

## 2018-04-27 DIAGNOSIS — H6122 Impacted cerumen, left ear: Secondary | ICD-10-CM

## 2018-04-27 NOTE — Patient Instructions (Signed)
Keep November appt as before , call if you need me sooner  Ear flush today by nurse will improve symptom  All the best with surgery and treatment

## 2018-04-28 ENCOUNTER — Ambulatory Visit (HOSPITAL_COMMUNITY): Payer: Medicare Other

## 2018-04-28 ENCOUNTER — Other Ambulatory Visit: Payer: Self-pay | Admitting: Family Medicine

## 2018-04-29 ENCOUNTER — Encounter: Payer: Self-pay | Admitting: Family Medicine

## 2018-04-29 NOTE — Assessment & Plan Note (Signed)
Controlled, no change in medication DASH diet and commitment to daily physical activity for a minimum of 30 minutes discussed and encouraged, as a part of hypertension management. The importance of attaining a healthy weight is also discussed.  BP/Weight 04/27/2018 04/21/2018 03/31/2018 03/30/2018 01/12/2018 08/03/2017 83/09/5174  Systolic BP 160 737 106 269 [phone]  Diastolic BP 74 62 64 82 78 68 60  Wt. (Lbs) 195 195 197 198 194 181 180  BMI 42.2 42.2 42.63 42.85 41.98 39.17 38.95

## 2018-04-29 NOTE — Progress Notes (Signed)
   Tara [name]     MRN: [id number]      DOB: [birth year]   HPI Tara [name] is here with a 1 week h/o left ear fullness, she denies any trauma to the ear, and has no drainage from the ear. She has no recent h/o  Sinus drain, sore throat or productive cough Has not used Q tips but has used wax softeer Recently diagnosed with breast cancer for the 2nd time and  Has upcoming imaging planned including CT abdomen and pelvis and whole body scan, she is dealing with thisas best she can but is obviousley disappointedly with having to deal with recurrent disease  ROS Denies recent fever or chills. Denies sinus pressure, nasal congestion, ear pain or sore throat. Denies chest congestion, productive cough or wheezing. Denies chest pains, palpitations and leg swelling Denies abdominal pain, nausea, vomiting,diarrhea or constipation.   Denies dysuria, frequency, hesitancy or incontinence. Denies joint pain, swelling and limitation in mobility. Denies headaches, seizures, numbness, or tingling. Denies uncontrolled depression, anxiety or insomnia. Denies skin break down or rash.   PE  BP 114/74   Pulse (!) 56   Resp 16   Ht 4\' 9"  (1.448 m)   Wt 195 lb (88.5 kg)   SpO2 94%   BMI 42.20 kg/m   Patient alert and oriented and in no cardiopulmonary distress.  HEENT: No facial asymmetry, EOMI,   oropharynx pink and moist.  Neck supple no JVD, no mass.Left TM occluded with cerumen, right Tm partially occluded with cerumen Both Ears successfully irrigated by LPN, no trauma to ears and TM clear  Chest: Clear to auscultation bilaterally.  CVS: S1, S2 no murmurs, no S3.Regular rate.  ABD: Soft non tender.   Ext: No edema  MS: Adequate though reduced  ROM spine, shoulders, hips and knees.  Skin: Intact, no ulcerations or rash noted.  Psych: Good eye contact, normal affect. Memory intact not anxious or depressed appearing.  CNS: CN 2-12 intact, power,  normal throughout.no focal deficits  noted.   Assessment & Plan  Cerumen impaction Bilateral cerumen impaction, left greater than right, successful ear irrigation by LPN with no trauma to TM or external ear  Morbid obesity ([employer]) Unchanged Patient re-educated about  the importance of commitment to a  minimum of 150 minutes of exercise per week.  The importance of healthy food choices with portion control discussed. Encouraged to start a food diary, count calories and to consider  joining a support group. Sample diet sheets offered. Goals set by the patient for the next several months.   Weight /BMI 04/27/2018 04/21/2018 03/31/2018  WEIGHT 195 lb 195 lb 197 lb  HEIGHT 4\' 9"  4\' 9"  4\' 9"   BMI 42.2 kg/m2 42.2 kg/m2 42.63 kg/m2      Hypertension Controlled, no change in medication DASH diet and commitment to daily physical activity for a minimum of 30 minutes discussed and encouraged, as a part of hypertension management. The importance of attaining a healthy weight is also discussed.  BP/Weight 04/27/2018 04/21/2018 03/31/2018 03/30/2018 01/12/2018 08/03/2017 62/03/349  Systolic BP 093 [phone] 381  Diastolic BP 74 62 64 82 78 68 60  Wt. (Lbs) 195 195 197 198 194 181 180  BMI 42.2 42.2 42.63 42.85 41.98 39.17 38.95

## 2018-04-29 NOTE — Assessment & Plan Note (Signed)
Bilateral cerumen impaction, left greater than right, successful ear irrigation by LPN with no trauma to TM or external ear

## 2018-04-29 NOTE — Assessment & Plan Note (Addendum)
Unchanged Patient re-educated about  the importance of commitment to a  minimum of 150 minutes of exercise per week.  The importance of healthy food choices with portion control discussed. Encouraged to start a food diary, count calories and to consider  joining a support group. Sample diet sheets offered. Goals set by the patient for the next several months.   Weight /BMI 04/27/2018 04/21/2018 03/31/2018  WEIGHT 195 lb 195 lb 197 lb  HEIGHT 4\' 9"  4\' 9"  4\' 9"   BMI 42.2 kg/m2 42.2 kg/m2 42.63 kg/m2

## 2018-04-30 ENCOUNTER — Other Ambulatory Visit (HOSPITAL_COMMUNITY): Payer: Medicare Other

## 2018-04-30 ENCOUNTER — Other Ambulatory Visit: Payer: Medicare Other

## 2018-04-30 ENCOUNTER — Ambulatory Visit (HOSPITAL_COMMUNITY): Payer: Medicare Other

## 2018-05-03 ENCOUNTER — Other Ambulatory Visit (HOSPITAL_COMMUNITY): Payer: Medicare Other

## 2018-05-03 ENCOUNTER — Encounter (HOSPITAL_COMMUNITY): Payer: Medicare Other

## 2018-05-04 ENCOUNTER — Encounter (HOSPITAL_COMMUNITY)
Admission: RE | Admit: 2018-05-04 | Discharge: 2018-05-04 | Disposition: A | Discharge status: Home / Self Care | Payer: Medicare Other | Source: Ambulatory Visit | Attending: Hematology and Oncology | Admitting: Hematology and Oncology

## 2018-05-04 ENCOUNTER — Ambulatory Visit (HOSPITAL_COMMUNITY)
Admission: RE | Admit: 2018-05-04 | Discharge: 2018-05-04 | Disposition: A | Discharge status: Home / Self Care | Payer: Medicare Other | Source: Ambulatory Visit | Attending: Hematology and Oncology | Admitting: Hematology and Oncology

## 2018-05-04 ENCOUNTER — Telehealth: Payer: Self-pay | Admitting: Hematology and Oncology

## 2018-05-04 ENCOUNTER — Inpatient Hospital Stay: Payer: Medicare Other | Attending: Hematology and Oncology

## 2018-05-04 DIAGNOSIS — Z171 Estrogen receptor negative status [ER-]: Secondary | ICD-10-CM | POA: Diagnosis present

## 2018-05-04 DIAGNOSIS — K229 Disease of esophagus, unspecified: Secondary | ICD-10-CM | POA: Insufficient documentation

## 2018-05-04 DIAGNOSIS — J984 Other disorders of lung: Secondary | ICD-10-CM | POA: Insufficient documentation

## 2018-05-04 DIAGNOSIS — C50411 Malignant neoplasm of upper-outer quadrant of right female breast: Secondary | ICD-10-CM

## 2018-05-04 DIAGNOSIS — K449 Diaphragmatic hernia without obstruction or gangrene: Secondary | ICD-10-CM | POA: Diagnosis not present

## 2018-05-04 DIAGNOSIS — C50919 Malignant neoplasm of unspecified site of unspecified female breast: Secondary | ICD-10-CM | POA: Diagnosis not present

## 2018-05-04 LAB — CBC WITH DIFFERENTIAL (CANCER CENTER ONLY)
BASOS ABS: 0 10*3/uL (ref 0.0–0.1)
BASOS PCT: 0 %
EOS ABS: 0.3 10*3/uL (ref 0.0–0.5)
Eosinophils Relative: 3 %
HCT: 34.3 % — ABNORMAL LOW (ref 34.8–46.6)
Hemoglobin: 10.8 g/dL — ABNORMAL LOW (ref 11.6–15.9)
Lymphocytes Relative: 24 %
Lymphs Abs: 2.3 10*3/uL (ref 0.9–3.3)
MCH: 26.5 pg (ref 25.1–34.0)
MCHC: 31.5 g/dL (ref 31.5–36.0)
MCV: 84.3 fL (ref 79.5–101.0)
MONO ABS: 0.6 10*3/uL (ref 0.1–0.9)
MONOS PCT: 6 %
NEUTROS PCT: 67 %
Neutro Abs: 6.3 10*3/uL (ref 1.5–6.5)
Platelet Count: 243 10*3/uL (ref 145–400)
RBC: 4.07 MIL/uL (ref 3.70–5.45)
RDW: 15.5 % — ABNORMAL HIGH (ref 11.2–14.5)
WBC Count: 9.5 10*3/uL (ref 3.9–10.3)

## 2018-05-04 LAB — CMP (CANCER CENTER ONLY)
ALBUMIN: 3.4 g/dL — AB (ref 3.5–5.0)
ALT: 10 U/L (ref 0–44)
AST: 12 U/L — AB (ref 15–41)
Alkaline Phosphatase: 93 U/L (ref 38–126)
Anion gap: 10 (ref 5–15)
BUN: 29 mg/dL — AB (ref 8–23)
CHLORIDE: 114 mmol/L — AB (ref 98–111)
CO2: 20 mmol/L — AB (ref 22–32)
Calcium: 8.6 mg/dL — ABNORMAL LOW (ref 8.9–10.3)
Creatinine: 1.26 mg/dL — ABNORMAL HIGH (ref 0.44–1.00)
GFR, Est AFR Am: 48 mL/min — ABNORMAL LOW (ref >60)
GFR, Estimated: 42 mL/min — ABNORMAL LOW (ref >60)
GLUCOSE: 100 mg/dL — AB (ref 70–99)
POTASSIUM: 4.6 mmol/L (ref 3.5–5.1)
SODIUM: 144 mmol/L (ref 135–145)
Total Bilirubin: 0.3 mg/dL (ref 0.3–1.2)
Total Protein: 7.1 g/dL (ref 6.5–8.1)

## 2018-05-04 MED ORDER — TECHNETIUM TC 99M MEDRONATE IV KIT
20.0000 | PACK | Freq: Once | INTRAVENOUS | Status: AC | PRN
  Administered 2018-05-04: 22 via INTRAVENOUS

## 2018-05-04 MED ORDER — IOHEXOL 300 MG/ML  SOLN
100.0000 mL | Freq: Once | INTRAMUSCULAR | Status: AC | PRN
  Administered 2018-05-04: 100 mL via INTRAVENOUS

## 2018-05-04 NOTE — Telephone Encounter (Signed)
I left a voicemail that the CT scans do not show any evidence of metastatic disease.  There was chronic scar tissue in the lung as well as esophagitis.

## 2018-05-13 DIAGNOSIS — Z6839 Body mass index (BMI) 39.0-39.9, adult: Secondary | ICD-10-CM | POA: Diagnosis not present

## 2018-05-13 DIAGNOSIS — I428 Other cardiomyopathies: Secondary | ICD-10-CM | POA: Diagnosis not present

## 2018-05-13 DIAGNOSIS — C50911 Malignant neoplasm of unspecified site of right female breast: Secondary | ICD-10-CM | POA: Diagnosis not present

## 2018-05-13 DIAGNOSIS — Z853 Personal history of malignant neoplasm of breast: Secondary | ICD-10-CM | POA: Diagnosis not present

## 2018-05-17 ENCOUNTER — Inpatient Hospital Stay (HOSPITAL_BASED_OUTPATIENT_CLINIC_OR_DEPARTMENT_OTHER): Payer: Medicare Other | Admitting: Genetic Counselor

## 2018-05-17 DIAGNOSIS — Z7183 Encounter for nonprocreative genetic counseling: Secondary | ICD-10-CM

## 2018-05-17 DIAGNOSIS — Z800 Family history of malignant neoplasm of digestive organs: Secondary | ICD-10-CM

## 2018-05-17 DIAGNOSIS — Z853 Personal history of malignant neoplasm of breast: Secondary | ICD-10-CM

## 2018-05-17 DIAGNOSIS — Z8042 Family history of malignant neoplasm of prostate: Secondary | ICD-10-CM

## 2018-05-17 DIAGNOSIS — Z171 Estrogen receptor negative status [ER-]: Secondary | ICD-10-CM

## 2018-05-17 DIAGNOSIS — C50411 Malignant neoplasm of upper-outer quadrant of right female breast: Secondary | ICD-10-CM | POA: Diagnosis not present

## 2018-05-18 ENCOUNTER — Encounter: Payer: Self-pay | Admitting: Genetic Counselor

## 2018-05-18 DIAGNOSIS — Z8042 Family history of malignant neoplasm of prostate: Secondary | ICD-10-CM | POA: Insufficient documentation

## 2018-05-18 DIAGNOSIS — Z853 Personal history of malignant neoplasm of breast: Secondary | ICD-10-CM | POA: Insufficient documentation

## 2018-05-18 NOTE — Progress Notes (Signed)
REFERRING PROVIDER: Nicholas Lose, MD [address] [place], [place] [address]  PRIMARY PROVIDER:  [name], MD  PRIMARY REASON FOR VISIT:  1. Malignant neoplasm of upper-outer quadrant of right breast in female, estrogen receptor negative ([place])   2. Family history of prostate cancer   3. History of breast cancer   4. Family history of colon cancer      HISTORY OF PRESENT ILLNESS:   Tara [name], a 72 y.o. female, was seen for a [facility] cancer genetics consultation at the request of Dr. Lindi Adie due to a personal and family history of cancer.  Tara [name] presents to clinic today to discuss the possibility of a hereditary predisposition to cancer, genetic testing, and to further clarify her future cancer risks, as well as potential cancer risks for family members.   In 2007, at the age of 60, Tara [name] was diagnosed with cancer of the right breast. In 2019 at the age of 34, Tara [name] was diagnosed with cancer of the right breast.  She reports that she does not want to take chemotherapy any longer.    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  Ultrasound reveals 0.9 cm, 1.3 cm, 1 cm no abnormal lymph nodes, right breast biopsy 10 o'clock position: IDC grade 3, ER 0%, PR 0%, Ki-67 60%, HER-2 negative ratio 1.74      HORMONAL RISK FACTORS:  Menarche was at age 29.  First live birth at age 3.  OCP use for approximately 9 years.  Ovaries intact: one ovary intact.  Hysterectomy: yes.   Menopausal status: postmenopausal.  HRT use: 10 years. Colonoscopy: yes; approximately 8 polyps. Mammogram within the last year: yes. Number of breast biopsies: 2. Up to date with pelvic exams:  yes. 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  Past Medical History:  Diagnosis Date  . Abnormal mammogram of right breast 07/29/2017  . 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 Anxiety   . Breast cancer [facility]) 2008   right - s/p lumpectomy->chemo, radiation  . Depression   . Dysrhythmia    hx SVT  . Family history of colon cancer   . Family history of prostate cancer   . GERD (gastroesophageal reflux disease)   . H/O: hysterectomy   . Hiatal hernia   . History of cancer chemotherapy   . History of radiation therapy   . Hyperlipidemia   . Hypertension 01/25/2018  . Nonischemic cardiomyopathy ([place])   . Personal history of radiation therapy 01/05/209  . SVT (supraventricular tachycardia) ([place])    short RP SVT documented 5/14  . Systolic CHF [facility])     Past Surgical History:  Procedure Laterality Date  . ABDOMINAL HYSTERECTOMY  1994   fibroids,   . BIOPSY  06/19/2016   Procedure: BIOPSY;  Surgeon: Daneil Dolin, MD;  Location[facility];  Service: Endoscopy;;  gastric duodenum  . BREAST BIOPSY Right 12/16/2006   malignant  . BREAST EXCISIONAL BIOPSY Right 2018   benign lumpectomy  . BREAST LUMPECTOMY Right 02/2007  . BREAST LUMPECTOMY WITH RADIOACTIVE SEED LOCALIZATION Right 07/29/2017   Procedure: RIGHT BREAST LUMPECTOMY WITH RADIOACTIVE SEED LOCALIZATION;  Surgeon: Fanny Skates, MD;  Location: [facility];  Service: General;  Laterality: Right;  . BREAST SURGERY Right 2008   lumpectomy, cancer  . CHOLECYSTECTOMY  1999  . COLONOSCOPY  2008   Dr. Oneida Alar: multiple polyps. Path not available at time of visit.   Marland Kitchen COLONOSCOPY WITH PROPOFOL N/A 04/25/2014   Dr. Oneida Alar: Multiple  tubular adenomas removed. Diverticulosis. Moderate internal hemorrhoids. Next colonoscopy planned for August 2018.  Marland Kitchen ESOPHAGOGASTRODUODENOSCOPY (EGD) WITH PROPOFOL N/A 06/19/2016   Procedure: ESOPHAGOGASTRODUODENOSCOPY (EGD) WITH PROPOFOL;  Surgeon: Daneil Dolin, MD;  [facility];  Service: Endoscopy;  Laterality: N/A;  . POLYPECTOMY N/A 04/25/2014   Procedure: POLYPECTOMY;  Surgeon: Danie Binder, MD;  Location: [facility] OR[facility];  Service: Endoscopy;  Laterality: N/A;  Ascending and Decending Colon x3 , Transverse colon x2, rectal  . RIGHT/LEFT HEART CATH AND CORONARY ANGIOGRAPHY N/A 07/10/2017   Procedure: RIGHT/LEFT HEART CATH AND CORONARY ANGIOGRAPHY;  Surgeon: Larey Dresser, MD;  Location: [facility];  Service: Cardiovascular;  Laterality: N/A;    Social History   Socioeconomic History  . Marital status: Married    Spouse name: Not on file  . Number of children: 2  . Years of education: Not on file  . Highest education level: Not on file  Occupational History  . Occupation: disabled     Fish farm manager: UNEMPLOYED  Social Needs  . Financial resource strain: Not hard at all  . Food insecurity:    Worry: Never true    Inability: Never true  . Transportation needs:    Medical: No    Non-medical: No  Tobacco Use  . Smoking status: Never Smoker  . Smokeless tobacco: Never Used  Substance and Sexual Activity  . Alcohol use: No  . Drug use: No  . Sexual activity: Not on file  Lifestyle  . Physical activity:    Days per week: 2 days    Minutes per session: 10 min  . Stress: Not at all  Relationships  . Social connections:    Talks on phone: More than three times a week    Gets together: More than three times a week    Attends religious service: More than 4 times per year    Active member of club or organization: No    Attends meetings of clubs or organizations: Not on file    Relationship status: Married  Other Topics Concern  . Not on file  Social History  Narrative   Lives in [place] with family.  Does not routinely exercise.     FAMILY HISTORY:  We obtained a detailed, 4-generation family history.  Significant diagnoses are listed below: Family History  Problem Relation Age of Onset  . Colon cancer Father 64       died at age 52  . Hypertension Sister   . Heart disease Sister   . Cancer Sister        unknown form  . Dementia Mother   . Stroke Mother 82       left hemiparesis  . Cancer Brother 46       prostate  . Cancer Paternal Aunt  NOS  . Lung cancer Paternal Uncle   . Other Paternal Uncle        lightning strike  . Other Paternal Uncle        hit by train  . Other Paternal 79        old age  . Cancer Cousin        NOS pat first cousin  . Cancer Cousin        NOS pat first cousin  . Prostate cancer Cousin        pat first cousin    The patient has a son and daughter who are cancer free.  She has three sisters and a brother.  Her brother has a diagnosis of prostate cancer.  One sister is deceased.  She may have had cancer prior to her death.  The patient's father is deceased and her mother is living at 78.  The patient's mother has five sisters who are cancer free.  The maternal grandparents are deceased from non-cancer related issues.  The patient's father was diagnosed with colon cancer at 42 and died at 51.  Her father had two brothers and four sisters.  One brother had lung cancer and another brother had an unknown cancer.  One sister had an unknown cancer.  Two cousins had unknown cancers and another cousin had prostate cancer.  The paternal grandparents are deceased from non-cancer related issues.  Tara [name] is unaware of previous family history of genetic testing for hereditary cancer risks. Patient's maternal ancestors are of [place] American and Caucasian descent, and paternal ancestors are of African American descent. There is no reported Ashkenazi Jewish ancestry. There is no known  consanguinity.  GENETIC COUNSELING ASSESSMENT: Tara [name] is a 72 y.o. female with a personal and family history of cancer which is somewhat suggestive of a hereditary cancer syndrome and predisposition to cancer. We, therefore, discussed and recommended the following at today's visit.   DISCUSSION: We discussed that about 5-10% of breast cancer is hereditary, with most cases due to BRCA mutations.  Other genes, most notably ATM, CHEK2 and PALB2, are associated with hereditary cancer syndromes.  We discussed that prior to the last few years, she would not have met criteria for genetic testing.  However, medical guidelines have expanded and now we can count her brother's diagnosis toward meeting criteria.    We discussed that individuals who undergo genetic testing and are found to have a BRCA mutation, would be eligible for PARP inhibitor use if needed.  The patient was unsure if she wanted to proceed with testing. She did not see how this would benefit her.  She would like to discuss further with her daughter.  We reviewed the characteristics, features and inheritance patterns of hereditary cancer syndromes. We also discussed genetic testing, including the appropriate family members to test, the process of testing, insurance coverage and turn-around-time for results. We discussed the implications of a negative, positive and/or variant of uncertain significant result. We recommended Tara [name] pursue genetic testing for the common hereditary gene panel. The Hereditary Gene Panel offered by [facility] includes sequencing and/or deletion duplication testing of the following 47 genes: APC, ATM, AXIN2, BARD1, BMPR1A, BRCA1, BRCA2, BRIP1, CDH1, CDK4, CDKN2A (p14ARF), CDKN2A (p16INK4a), CHEK2, CTNNA1, DICER1, EPCAM (Deletion/duplication testing only), GREM1 (promoter region deletion/duplication testing only), KIT, MEN1, MLH1, MSH2, MSH3, MSH6, MUTYH, NBN, NF1, NHTL1, PALB2, PDGFRA, PMS2, POLD1, POLE, PTEN, RAD50,  RAD51C, RAD51D, SDHB, SDHC, SDHD, SMAD4, SMARCA4. STK11, TP53, TSC1, TSC2, and VHL.  The following genes were evaluated for sequence changes only: SDHA and HOXB13 c.251G>A variant only.   Based on Tara [name]'s personal and family history of cancer, she meets medical criteria for genetic testing. Despite that she meets criteria, she may still have an out of pocket cost. We discussed that if her out of pocket cost for testing is over $100, the laboratory will call and confirm whether she wants to proceed with testing.  If the out of pocket cost of testing is less than $100 she will be billed by the genetic testing laboratory.   PLAN: Despite our recommendation, Tara [name] did not wish to pursue genetic testing at today's visit. We understand this decision, and remain available to coordinate genetic testing at any time in the future. Tara [name] may have her daughter, [name], call and discuss this further.  We, therefore, recommend Tara [name] continue to follow the cancer screening guidelines given by her primary healthcare provider.  Lastly, we encouraged Tara [name] to remain in contact with cancer genetics annually so that we can continuously update the family history and inform her of any changes in cancer genetics and testing that may be of benefit for this family.   Ms.  [name]'s questions were answered to her satisfaction today. Our contact information was provided should additional questions or concerns arise. Thank you for the referral and allowing [place] to share in the care of your patient.   Markelle Asaro P. Florene Glen, [place], [facility] Certified Genetic Counselor [email or web address] phone: [phone]  The patient was seen for a total of 45 minutes in face-to-face genetic counseling.  This patient was discussed with Drs. Magrinat, Lindi Adie and/or Burr Medico who agrees with the above.    _______________________________________________________________________ For Office Staff:  Number of people involved in session: 2 Was an  Intern/ student involved with case: no

## 2018-05-19 ENCOUNTER — Other Ambulatory Visit: Payer: Self-pay | Admitting: Family Medicine

## 2018-05-21 DIAGNOSIS — C50411 Malignant neoplasm of upper-outer quadrant of right female breast: Secondary | ICD-10-CM | POA: Diagnosis not present

## 2018-05-21 DIAGNOSIS — Z171 Estrogen receptor negative status [ER-]: Secondary | ICD-10-CM | POA: Diagnosis not present

## 2018-05-25 ENCOUNTER — Other Ambulatory Visit: Payer: Self-pay | Admitting: General Surgery

## 2018-05-25 ENCOUNTER — Telehealth: Payer: Self-pay | Admitting: Genetic Counselor

## 2018-05-25 DIAGNOSIS — C50911 Malignant neoplasm of unspecified site of right female breast: Secondary | ICD-10-CM

## 2018-05-25 NOTE — Telephone Encounter (Signed)
Daughter called to follow up on Ms. Clapp's genetic counseling appointment.  Explained that her mother meets medical criteria for genetic testing based on personal and family history.  Testing would benefit Ms. Corallo based on determining whether her breast cancer is due to hereditary causes, it could change surgical decisions, and treatment options if she progresses to Stage IV, with parp inhibitors.  It also allows family members to learn if they are at risk for hereditary cancer syndromes and be screened appropriately if they are.  Patient's daughter indicated that she will probably want to consolidate blood draws after surgery.

## 2018-05-26 ENCOUNTER — Telehealth: Payer: Self-pay | Admitting: Hematology and Oncology

## 2018-05-26 NOTE — Telephone Encounter (Signed)
I TRIED TO REACH REGARDING 9/3 [place] MSG. I DID LEAVE A VOICEMAIL ALSO MAIL LETTER

## 2018-06-01 NOTE — Pre-Procedure Instructions (Addendum)
Tara [name]  06/01/2018      [name] - [place], [name] [address] Phone: [phone] Fax: [phone]  [name], [place] - [address] [place] [address] Phone: [phone] Fax: [phone]  [place], [place] [address] Phone: [phone] Fax: [phone]    Your procedure is scheduled on June 09, 2018.  Report to [facility] Admitting at 800 AM.  Call this number if you have problems the morning of surgery:  [phone]   Remember:  Do not eat after midnight.  You may drink clear liquids until 700 AM- 3 hours prior to procedure start time .  Clear liquids allowed are:       Water, Juice (non-citric and without pulp), Carbonated beverages, Clear Tea, Black Coffee only, Plain Jell-O only, Gatorade and Plain Popsicles only    Take these medicines the morning of surgery with A SIP OF WATER  Tylenol-if needed Metoprolol succinate (Toprol-XL) astelin nasal spray Entresto Metoclopramide (reglan) Montelukast (singulair) Pantoprazole (protonix) Sertraline (zoloft)  Follow your surgeon's instructions on when to hold/resume aspirin.  If no instructions were given call the office to determine how they would like to you take aspirin  7 days prior to surgery STOP taking any Aleve, Naproxen, Ibuprofen, Motrin, Advil, Goody's, BC's, all herbal medications, fish oil, and all vitamins    Do not wear jewelry, make-up or nail polish.  Do not wear lotions, powders, or perfumes, or deodorant.  Do not shave 48 hours prior to surgery.    Do not bring valuables to the hospital.  [facility] is not responsible for any belongings or valuables.  Contacts, dentures or bridgework may not be worn into surgery.  Leave your suitcase in the  car.  After surgery it may be brought to your room.  For patients admitted to the hospital, discharge time will be determined by your treatment team.  Patients discharged the day of surgery will not be allowed to drive home.    [place]ara [name]- Preparing For Surgery  Before surgery, you can play an important role. Because skin is not sterile, your skin needs to be as free of germs as possible. You can reduce the number of germs on your skin by washing with CHG (chlorahexidine gluconate) Soap before surgery.  CHG is an antiseptic cleaner which kills germs and bonds with the skin to continue killing germs even after washing.    Oral Hygiene is also important to reduce your risk of infection.  Remember - BRUSH YOUR TEETH THE MORNING OF SURGERY WITH YOUR REGULAR TOOTHPASTE  Please do not use if you have an allergy to CHG or antibacterial soaps. If your skin becomes reddened/irritated stop using the CHG.  Do not shave (including legs and underarms) for at least 48 hours prior to first CHG shower. It is OK to shave your face.  Please follow these instructions carefully.   1. Shower the NIGHT BEFORE SURGERY and the MORNING OF SURGERY with CHG.   2. If you chose to wash your hair, wash your hair first as usual with your normal shampoo.  3. After you shampoo, rinse your hair and body thoroughly to remove the shampoo.  4. Use CHG as you would any other liquid soap. You can apply CHG directly to  the skin and wash gently with a scrungie or a clean washcloth.   5. Apply the CHG Soap to your body ONLY FROM THE NECK DOWN.  Do not use on open wounds or open sores. Avoid contact with your eyes, ears, mouth and genitals (private parts). Wash Face and genitals (private parts)  with your normal soap.  6. Wash thoroughly, paying special attention to the area where your surgery will be performed.  7. Thoroughly rinse your body with warm water from the neck down.  8. DO NOT shower/wash with your normal soap  after using and rinsing off the CHG Soap.  9. Pat yourself dry with a CLEAN TOWEL.  10. Wear CLEAN PAJAMAS to bed the night before surgery, wear comfortable clothes the morning of surgery  11. Place CLEAN SHEETS on your bed the night of your first shower and DO NOT SLEEP WITH PETS.  Day of Surgery:  Do not apply any deodorants/lotions.  Please wear clean clothes to the hospital/surgery center.   Remember to brush your teeth WITH YOUR REGULAR TOOTHPASTE.  Please read over the following fact sheets that you were given.

## 2018-06-02 ENCOUNTER — Encounter (HOSPITAL_COMMUNITY): Payer: Self-pay

## 2018-06-02 ENCOUNTER — Other Ambulatory Visit: Payer: Self-pay

## 2018-06-02 ENCOUNTER — Encounter (HOSPITAL_COMMUNITY)
Admission: RE | Admit: 2018-06-02 | Discharge: 2018-06-02 | Disposition: A | Discharge status: Home / Self Care | Payer: Medicare Other | Source: Ambulatory Visit | Attending: General Surgery | Admitting: General Surgery

## 2018-06-02 DIAGNOSIS — F329 Major depressive disorder, single episode, unspecified: Secondary | ICD-10-CM | POA: Insufficient documentation

## 2018-06-02 DIAGNOSIS — I110 Hypertensive heart disease with heart failure: Secondary | ICD-10-CM | POA: Diagnosis not present

## 2018-06-02 DIAGNOSIS — E785 Hyperlipidemia, unspecified: Secondary | ICD-10-CM | POA: Insufficient documentation

## 2018-06-02 DIAGNOSIS — I429 Cardiomyopathy, unspecified: Secondary | ICD-10-CM | POA: Diagnosis not present

## 2018-06-02 DIAGNOSIS — K449 Diaphragmatic hernia without obstruction or gangrene: Secondary | ICD-10-CM | POA: Diagnosis not present

## 2018-06-02 DIAGNOSIS — I471 Supraventricular tachycardia: Secondary | ICD-10-CM | POA: Diagnosis not present

## 2018-06-02 DIAGNOSIS — Z7982 Long term (current) use of aspirin: Secondary | ICD-10-CM | POA: Diagnosis not present

## 2018-06-02 DIAGNOSIS — I5022 Chronic systolic (congestive) heart failure: Secondary | ICD-10-CM | POA: Insufficient documentation

## 2018-06-02 DIAGNOSIS — Z79899 Other long term (current) drug therapy: Secondary | ICD-10-CM | POA: Insufficient documentation

## 2018-06-02 DIAGNOSIS — F419 Anxiety disorder, unspecified: Secondary | ICD-10-CM | POA: Insufficient documentation

## 2018-06-02 DIAGNOSIS — I081 Rheumatic disorders of both mitral and tricuspid valves: Secondary | ICD-10-CM | POA: Insufficient documentation

## 2018-06-02 DIAGNOSIS — Z853 Personal history of malignant neoplasm of breast: Secondary | ICD-10-CM | POA: Diagnosis not present

## 2018-06-02 DIAGNOSIS — Z01812 Encounter for preprocedural laboratory examination: Secondary | ICD-10-CM | POA: Insufficient documentation

## 2018-06-02 HISTORY — DX: Respiratory tuberculosis unspecified: A15.9

## 2018-06-02 LAB — CBC WITH DIFFERENTIAL/PLATELET
Abs Immature Granulocytes: 0 10*3/uL (ref 0.0–0.1)
BASOS ABS: 0 10*3/uL (ref 0.0–0.1)
BASOS PCT: 0 %
EOS ABS: 0.3 10*3/uL (ref 0.0–0.7)
Eosinophils Relative: 3 %
HCT: 36.4 % (ref 36.0–46.0)
Hemoglobin: 11.3 g/dL — ABNORMAL LOW (ref 12.0–15.0)
Immature Granulocytes: 0 %
Lymphocytes Relative: 22 %
Lymphs Abs: 2.2 10*3/uL (ref 0.7–4.0)
MCH: 27 pg (ref 26.0–34.0)
MCHC: 31 g/dL (ref 30.0–36.0)
MCV: 87.1 fL (ref 78.0–100.0)
MONO ABS: 0.7 10*3/uL (ref 0.1–1.0)
MONOS PCT: 7 %
NEUTROS PCT: 68 %
Neutro Abs: 7 10*3/uL (ref 1.7–7.7)
Platelets: 251 10*3/uL (ref 150–400)
RBC: 4.18 MIL/uL (ref 3.87–5.11)
RDW: 14.8 % (ref 11.5–15.5)
WBC: 10.3 10*3/uL (ref 4.0–10.5)

## 2018-06-02 LAB — COMPREHENSIVE METABOLIC PANEL
ALT: 12 U/L (ref 0–44)
ANION GAP: 9 (ref 5–15)
AST: 14 U/L — ABNORMAL LOW (ref 15–41)
Albumin: 3.3 g/dL — ABNORMAL LOW (ref 3.5–5.0)
Alkaline Phosphatase: 94 U/L (ref 38–126)
BUN: 37 mg/dL — ABNORMAL HIGH (ref 8–23)
CO2: 19 mmol/L — AB (ref 22–32)
CREATININE: 1.56 mg/dL — AB (ref 0.44–1.00)
Calcium: 8.7 mg/dL — ABNORMAL LOW (ref 8.9–10.3)
Chloride: 112 mmol/L — ABNORMAL HIGH (ref 98–111)
GFR, EST AFRICAN AMERICAN: 37 mL/min — AB (ref >60)
GFR, EST NON AFRICAN AMERICAN: 32 mL/min — AB (ref >60)
Glucose, Bld: 109 mg/dL — ABNORMAL HIGH (ref 70–99)
POTASSIUM: 4.9 mmol/L (ref 3.5–5.1)
SODIUM: 140 mmol/L (ref 135–145)
Total Bilirubin: 0.3 mg/dL (ref 0.3–1.2)
Total Protein: 7.1 g/dL (ref 6.5–8.1)

## 2018-06-02 NOTE — Progress Notes (Addendum)
PCP is Dr. Bari Mantis  LOV 01/2018 Cardio is Dr. Clearnce Hasten  LOV 04/2018 Oncologist is Dr. Verdie Drown LOV 04/2018 Did have radiation & chemo 11 yrs ago. Currently denies any cp, sob, murmur, tho she did say she have systolic chf. 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 She states she takes aspirin as "preventative".  I called [facility] and inquired as to when she needs to stop. "5 days prior to"  Last dose should be 5/13 - patient is aware

## 2018-06-02 NOTE — Pre-Procedure Instructions (Signed)
Tara [name]  06/02/2018      [facility] - [place], [place] [address] Phone: [phone] Fax: [phone]  [place], [place] - [address] [place] [address] Phone: [phone] Fax: [phone]  [place], [place] [address] Phone: [phone] Fax: [phone]    Your procedure is scheduled on Wednesday, June 09, 2018.    Report to [facility] Admitting at 800 AM.               (posted surgery time 10a - 12:46p)   Call this number if you have problems the morning of surgery:  [phone]   Remember:   Do not eat an foods  after midnight, Tuesday.   You may drink clear liquids until 700 AM- 3 hours prior to procedure start time .  Clear liquids allowed are:       Water, Juice (non-citric and without pulp), Carbonated beverages, Clear Tea, Black Coffee only, Plain Jell-O only, Gatorade and Plain Popsicles only    Take these medicines the morning of surgery with A SIP OF WATER  Tylenol-if needed Metoprolol succinate (Toprol-XL) astelin nasal spray Entresto Metoclopramide (reglan) Montelukast (singulair) Pantoprazole (protonix) Sertraline (zoloft)  Follow your surgeon's instructions on when to hold/resume aspirin.  If no instructions were given call the office to determine how they would like to you take aspirin  7 days prior to surgery STOP taking any Aleve, Naproxen, Ibuprofen, Motrin, Advil, Goody's, BC's, all herbal medications, fish oil, and all vitamins    Do not wear jewelry, make-up or nail polish.  Do not wear lotions, powders, or perfumes, or deodorant.  Do not shave 48 hours prior to surgery.    Do not bring valuables to the hospital.  [facility] is not responsible for any belongings or  valuables.  Contacts, dentures or bridgework may not be worn into surgery.  Leave your suitcase in the car.  After surgery it may be brought to your room.  For patients admitted to the hospital, discharge time will be determined by your treatment team.  Patients discharged the day of surgery will not be allowed to drive home.    [facility]- Preparing For Surgery  Before surgery, you can play an important role. Because skin is not sterile, your skin needs to be as free of germs as possible. You can reduce the number of germs on your skin by washing with CHG (chlorahexidine gluconate) Soap before surgery.  CHG is an antiseptic cleaner which kills germs and bonds with the skin to continue killing germs even after washing.    Oral Hygiene is also important to reduce your risk of infection.   Remember - BRUSH YOUR TEETH THE MORNING OF SURGERY WITH YOUR REGULAR TOOTHPASTE  Please do not use if you have an allergy to CHG or antibacterial soaps. If your skin becomes reddened/irritated stop using the CHG.  Do not shave (including legs and underarms) for at least 48 hours prior to first CHG shower. It is OK to shave your face.  Please follow these instructions carefully.   1. Shower the NIGHT BEFORE SURGERY and the MORNING OF SURGERY with CHG.   2. If you chose to wash your hair, wash your hair first as usual with your normal shampoo.  3. After you shampoo, rinse your hair and body thoroughly to remove the shampoo.  4. Use CHG as you would any other liquid soap. You can apply CHG directly to the skin and wash gently with a scrungie or a clean washcloth.   5. Apply the CHG Soap to your body ONLY FROM THE NECK DOWN.  Do not use on open wounds or open sores. Avoid contact with your eyes, ears, mouth and genitals (private parts). Wash Face and genitals (private parts)  with your normal soap.  6. Wash thoroughly, paying special attention to the area where your surgery will be  performed.  7. Thoroughly rinse your body with warm water from the neck down.  8. DO NOT shower/wash with your normal soap after using and rinsing off the CHG Soap.  9. Pat yourself dry with a CLEAN TOWEL.  10. Wear CLEAN PAJAMAS to bed the night before surgery, wear comfortable clothes the morning of surgery  11. Place CLEAN SHEETS on your bed the night of your first shower and DO NOT SLEEP WITH PETS.  Day of Surgery:  Do not apply any deodorants/lotions.  Please wear clean clothes to the hospital/surgery center.   Remember to brush your teeth WITH YOUR REGULAR TOOTHPASTE.  Please read over the following fact sheets that you were given.

## 2018-06-03 NOTE — Progress Notes (Signed)
Anesthesia Chart Review:   Case:  [id number] Date/Time:  06/09/18 0945   Procedures:      RIGHT TOTAL MASTECTOMY WITH SENTINEL LYMPH NODE BIOPSY (Right Breast)     INSERTION PORT-A-CATH (N/A )   Anesthesia type:  General   Pre-op diagnosis:  RIGHT BREAST CANCER   Location:  [facility] OR ROOM 08 / [place] [facility]   Surgeon:  Tara Skates, MD      DISCUSSION: - Pt is a 72 year old [name] with hx breast cancer that has recurred.   - Hx NICM (EF recovered to 49% by 81/27/51 MRI), systolic CHF, SVT.   - Pt has cardiac clearance for surgery from Tara [name], [place] 7/Tara/19   VS: BP (!) 110/46   Pulse 64   Temp 36.8 C (Oral)   Resp 16   Ht 4\' 9"  (1.448 m)   Wt 88 kg   SpO2 96%   BMI 41.99 kg/m   PROVIDERS: - PCP is [name], MD  - HF cardiologist is Loralie Champagne, MD. Last office visit 07/21/17.  - EP cardiologist is Thompson Grayer, MD.  Last office visit 03/31/18.  - Oncologist is Tara Lose, MD   LABS: Labs reviewed: Acceptable for surgery. (all labs ordered are listed, but only abnormal results are displayed)  Labs Reviewed  CBC WITH DIFFERENTIAL/PLATELET - Abnormal; Notable for the following components:      Result Value   Hemoglobin 11.3 (*)    All other components within normal limits  COMPREHENSIVE METABOLIC PANEL - Abnormal; Notable for the following components:   Chloride 112 (*)    CO2 19 (*)    Glucose, Bld 109 (*)    BUN 37 (*)    Creatinine, Ser 1.56 (*)    Calcium 8.7 (*)    Albumin 3.3 (*)    AST 14 (*)    GFR calc non Af Amer 32 (*)    GFR calc Af Amer 37 (*)    All other components within normal limits     IMAGES:  CT chest 05/04/18:  1. Surgical changes involving the right breast. No axillary or supraclavicular lymphadenopathy. 2. Chronic left basilar scarring changes. No findings suspicious for pulmonary metastatic disease. 3. Moderate to large hiatal hernia and distal esophageal wall thickening, likely esophagitis. 4. No findings for  abdominal/pelvic metastatic disease or osseous metastatic disease. 5. Stable changes of remote granulomas disease involving the chest.   EKG 06/02/18: NSR   CV:  MR cardiac morphology 08/10/17:  1.  Normal LV size with EF 49%, diffuse hypokinesis. 2.  Mildly dilated RV with normal systolic function. 3. No myocardial LGE, so no definitive evidence for prior MI, infiltrative disease, or myocarditis.   R/L cardiac cath 07/10/17:  1. Mildly elevated PCWP, mild pulmonary hypertension.  2. Preserved cardiac output.  3. No significant coronary disease, nonischemic cardiomyopathy.  .  Echo 10/01/16:  - Left ventricle: The cavity size was normal. Systolic function was severely reduced. The estimated ejection fraction was in the range of 20% to Tara%. Diffuse hypokinesis. Features are consistent with a pseudonormal left ventricular filling pattern, with concomitant abnormal relaxation and increased filling pressure (grade 2 diastolic dysfunction). - Ventricular septum: The contour showed diastolic flattening. - Mitral valve: There was moderate regurgitation. - Left atrium: The atrium was mildly dilated. - Tricuspid valve: There was severe regurgitation. - Pulmonary arteries: Systolic pressure was mildly increased. PA peak pressure: 35 mm Hg (S). - Impressions: Compared to the prior study, there has been  no   Past Medical History:  Diagnosis Date  . Abnormal mammogram of right breast 07/29/2017  . 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 Anxiety   . Breast cancer [facility]) 2008   right - s/p lumpectomy->chemo, radiation  . Depression   . Dysrhythmia    hx SVT  . Family history of colon cancer   . Family history of prostate cancer   . GERD (gastroesophageal reflux disease)   . H/O: hysterectomy   . Hiatal hernia   . History of cancer chemotherapy   . History of radiation therapy   . Hyperlipidemia   . Hypertension 01/25/2018  . Nonischemic cardiomyopathy ([place])   . Personal history of radiation  therapy 01/05/209  . SVT (supraventricular tachycardia) ([place])    short RP SVT documented 5/14  . 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[place])   . 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 ABDOMINAL HYSTERECTOMY  1994   fibroids,   . BIOPSY  06/19/2016   Procedure: BIOPSY;  Surgeon: Tara Dolin, MD;  [facility];  Service: Endoscopy;;  gastric duodenum  . BREAST BIOPSY Right 12/16/2006   malignant  . BREAST EXCISIONAL BIOPSY Right 2018   benign lumpectomy  . BREAST LUMPECTOMY Right 02/2007  . BREAST LUMPECTOMY WITH RADIOACTIVE SEED LOCALIZATION Right 07/29/2017   Procedure: RIGHT BREAST LUMPECTOMY WITH RADIOACTIVE SEED LOCALIZATION;  Surgeon: Tara Skates, MD;  Location: [facility];  Service: General;  Laterality: Right;  . BREAST SURGERY Right 2008   lumpectomy, cancer  . CARDIAC CATHETERIZATION    . CHOLECYSTECTOMY  1999  . COLONOSCOPY  2008   Dr. Oneida [name]: multiple polyps. Path not available at time of visit.   Marland Kitchen COLONOSCOPY WITH PROPOFOL N/A 04/25/2014   Dr. Oneida [name]: Multiple tubular adenomas removed. Diverticulosis. Moderate internal hemorrhoids. Next colonoscopy planned for August 2018.  Marland Kitchen ESOPHAGOGASTRODUODENOSCOPY (EGD) WITH PROPOFOL N/A 06/19/2016   Procedure: ESOPHAGOGASTRODUODENOSCOPY (EGD) WITH PROPOFOL;  Surgeon: Tara Dolin, MD;  [facility];  Service: Endoscopy;  Laterality: N/A;  . POLYPECTOMY N/A 04/25/2014   Procedure: POLYPECTOMY;  Surgeon: Tara Binder, MD;  Location: [facility] ORS;  Service: Endoscopy;  Laterality: N/A;  Ascending and Decending Colon x3 , Transverse colon x2, rectal  . RIGHT/LEFT HEART CATH AND CORONARY ANGIOGRAPHY N/A 07/10/2017   Procedure: RIGHT/LEFT HEART CATH AND CORONARY ANGIOGRAPHY;  Surgeon: Tara Dresser, MD;  Location: [place] CV LAB;  Service: Cardiovascular;  Laterality: N/A;     MEDICATIONS: . acetaminophen (TYLENOL) 500 MG tablet  . aspirin EC 81 MG tablet  . azelastine (ASTELIN) 0.1 % nasal spray  . clonazePAM (KLONOPIN) 0.5 MG tablet  . docusate sodium (COLACE) 100 MG capsule  . ENTRESTO 90-24 MG  . folic acid (FOLVITE) 1 MG tablet  . lovastatin (MEVACOR) 40 MG tablet  . metoCLOPramide (REGLAN) 10 MG tablet  . metoprolol succinate (TOPROL-XL) 50 MG 24 hr tablet  . montelukast (SINGULAIR) 10 MG tablet  . pantoprazole (PROTONIX) 40 MG tablet  . sertraline (ZOLOFT) 50 MG tablet  . spironolactone (ALDACTONE) Tara MG tablet  . zolpidem (AMBIEN) 10 MG tablet   No current facility-administered medications for this encounter.     If no changes, I anticipate pt can proceed with surgery as scheduled.   Willeen Cass, FNP-BC [facility] Stay [facility]/Anesthesiology Phone: (  [phone] 06/03/2018 11:15 AM

## 2018-06-04 ENCOUNTER — Other Ambulatory Visit: Payer: Self-pay | Admitting: Family Medicine

## 2018-06-04 NOTE — H&P (Signed)
[name] Location: [facility] Patient #: [id number] DOB: 11-29-1945 Married / Language: English / Race: Black or African American Female        History of Present Illness      . This is a 72 year old female who returns to discuss definitive surgery for her recurrent right breast cancer. 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 Her husband and daughter are with her throughout the encounter. Dr. Lindi Adie is her oncologist. [name] is her PCP. Dr. Rayann Heman is her cardiologist. Her images are BCG.      Right breast cancer diagnosed in 2008. Marylene Buerger performed lumpectomy and ultimately went back for complete right axillary lymph node dissection with 3 out of 10 lymph nodes positive. She received adjuvant radiation therapy and adjuvant chemotherapy and the port was removed. This was difficult on her.      I  performed a right breast biopsy up her inner quadrant 1 year ago which turned out to be benign CSL.      Imaging studies on March 26, 2018 showed 3 possible masses in the right breast, upper outer quadrant. 2 at the 10 o'clock position and one at the 9 o'clock position. Axillary ultrasound negative. 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 She has seen Dr. Lindi Adie. We have discussed this in breast conference.  Staging scans with bone scan and CT are negative.  Consensus recommendation is Port-A-Cath, salvage mastectomy possible sentinel node biopsy and adjuvant chemotherapy. Genetic counseling was recommended.      Comorbidities include previous surgery and chemotherapy 2008. History of SVT and nonischemic cardiomyopathy. She has seen Dr. Rayann Heman and he has given cardiac clearance for mastectomy. Hypertension. Anxiety and depression. Elevated BMI. Family history reveals father died of colon cancer age 72. Mother living age 12. No family history of breast or  ovarian cancer.  She lives in [place]. Married. 2 children. Denies tobacco. 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  We had a long discussion. She is basically willing to undergo right total mastectomy, attempted right sentinel lymph node biopsy and Port-A-Cath insertion. She saw plastics and decided against reconstruction. I discussed the indications, details, techniques, and numerous risk of the surgery. She is aware of the risk of difficulty inserting the Port-A-Cath, air embolus, pneumothorax with chest tube placement, mastectomy wound bleeding or infection, arm swelling, arm numbness, shoulder disability. She understands all these issues. All her questions were answered. She agrees with this plan  Cardiac clearance with Dr. Rayann Heman has been completed and is documented on the chart     Allergies Geni Bers Haggett; 05/13/2018 10:18 AM) Sudafed *NASAL AGENTS - SYSTEMIC AND TOPICAL*  Sore Throat Aspirin *ANALGESICS - NonNarcotic*  Lisinopril *ANTIHYPERTENSIVES*  Cough. Allergies Reconciled   Medication History (Jacqueline Haggett; 05/13/2018 10:18 AM) Azelastine HCl (0.1% Solution, Nasal) Active. ClonazePAM (0.5MG  Tablet, Oral) Active. Zolpidem Tartrate (10MG  Tablet, Oral) Active. Entresto (24-26MG  Tablet, Oral) Active. Furosemide (40MG  Tablet, Oral) Active. Lovastatin (40MG  Tablet, Oral) Active. Metoclopramide HCl (10MG  Tablet, Oral) Active. Metoprolol Succinate ER (50MG  Tablet ER 24HR, Oral) Active. Montelukast Sodium (10MG  Tablet, Oral) Active. Pantoprazole Sodium (40MG  Tablet DR, Oral) Active. Sertraline HCl (50MG  Tablet, Oral) Active. Spironolactone (25MG  Tablet, Oral) Active. Medications Reconciled  Vitals Geni Bers Haggett; 05/13/2018 10:19 AM) 05/13/2018 10:18 AM Weight: 196.6 lb Height: 57in Height was reported by patient. Body Surface Area: 1.79 m Body Mass Index: 42.54  kg/m   Temp.: 96.41F(Temporal)  Pulse: 62 (Regular)  BP: 118/82 (Sitting, Left Arm, Standard)       Physical Exam Renelda Loma M. Dalbert Batman MD; 05/13/2018 12:45 PM) General Mental Status-Alert. General Appearance-Not in acute distress. Build & Nutrition-Well nourished. Posture-Normal posture. Gait-Normal.  Head and Neck Head-normocephalic, atraumatic with no lesions or palpable masses. Trachea-midline. Thyroid Gland Characteristics - normal size and consistency and no palpable nodules.  Chest and Lung Exam Chest and lung exam reveals -on auscultation, normal breath sounds, no adventitious sounds and normal vocal resonance.  Breast Note: brest are medium size. Curvilinear lumpectomy scar upper outer quadrant transverse incision right axilla and transverse incision 12:00 all well-healed from rheumatoid surgery. Port-A-Cath incision left infraclavicular area. No other skin changes mass or axillary adenopathy on either side.   Cardiovascular Cardiovascular examination reveals -normal heart sounds, regular rate and rhythm with no murmurs and femoral artery auscultation bilaterally reveals normal pulses, no bruits, no thrills.  Abdomen Inspection Inspection of the abdomen reveals - No Hernias. Palpation/Percussion Palpation and Percussion of the abdomen reveal - Soft, Non Tender, No Rigidity (guarding), No hepatosplenomegaly and No Palpable abdominal masses.  Neurologic Neurologic evaluation reveals -alert and oriented x 3 with no impairment of recent or remote memory, normal attention span and ability to concentrate, normal sensation and normal coordination.  Musculoskeletal Normal Exam - Bilateral-Upper Extremity Strength Normal and Lower Extremity Strength Normal.    Assessment & Plan  RECURRENT BREAST CANCER, RIGHT (C50.911)   You have discussed management of your recurrent right breast cancer with Dr. Lindi Adie Fortunately, all of your staging scans are  negative for metastatic disease We have reviewed the details of your recurrent right breast cancer, which is that it is a high-grade answer and a triple negative breast cancer with a high risk of recurrence I completely agree with chemotherapy  you will be scheduled for right total mastectomy, attempted right axillary sentinel lymph node biopsy, and Port-A-Cath insertion at [place] We have discussed the indications, techniques, and risks of this surgery in detail with you and your husband and other family members  You have expressed an interest in possible immediate or delayed reconstruction That is appropriate you will be referred to a plastic surgeon immediately for their opinion If you decide to have reconstruction we will coordinate surgery at the same time  Your cardiologist, Dr. Rayann Heman, has given [place] a clearance for the surgery stating that you are acceptable risk without further cardiovascular testing his note is dated April 12, 2018.  You saw Dr. Iran Planas and you decided to proceed with mastectomy without reconstruction  BMI 39.0-39.9,ADULT (Z68.39) HISTORY OF RIGHT BREAST CANCER (Z85.3) NONISCHEMIC CARDIOMYOPATHY (I42.8) SVT (SUPRAVENTRICULAR TACHYCARDIA) (I47.1)    Benancio Osmundson M. Dalbert Batman, M.D., [facility] General and Minimally invasive Surgery Breast and Colorectal Surgery Office:   [phone] Pager:   [phone]

## 2018-06-07 NOTE — Progress Notes (Signed)
FMLA for daughter, Ricki Rodriguez, successfully faxed to [employer] at [phone]. Mailed copy to daughters address at [address] [place], [place] [address].

## 2018-06-08 ENCOUNTER — Telehealth: Payer: Self-pay | Admitting: Genetic Counselor

## 2018-06-08 NOTE — Anesthesia Preprocedure Evaluation (Addendum)
Anesthesia Evaluation  Patient identified by MRN, date of birth, ID band Patient awake    Reviewed: Allergy & Precautions, NPO status , Patient's Chart, lab work & pertinent test results  Airway Mallampati: II  TM Distance: >3 FB Neck ROM: Full    Dental no notable dental hx. (+) Teeth Intact, Dental Advisory Given   Pulmonary neg pulmonary ROS,    Pulmonary exam normal breath sounds clear to auscultation       Cardiovascular hypertension, Pt. on medications and Pt. on home beta blockers +CHF  Normal cardiovascular exam Rhythm:Regular Rate:Normal  09/2016 ECHO  - Left ventricle: The cavity size was normal. Systolic function was   severely reduced. The estimated ejection fraction was in the   range of 20% to 25%. Diffuse hypokinesis. Features are consistent   with a pseudonormal left ventricular filling pattern, with   concomitant abnormal relaxation and increased filling pressure   (grade 2 diastolic dysfunction). - Ventricular septum: The contour showed diastolic flattening. - Mitral valve: There was moderate regurgitation. - Left atrium: The atrium was mildly dilated. - Tricuspid valve: There was severe regurgitation. - Pulmonary arteries: Systolic pressure was mildly increased. PA   peak pressure: 35 mm Hg (S).   Neuro/Psych  Neuromuscular disease negative neurological ROS     GI/Hepatic Neg liver ROS, hiatal hernia, GERD  ,  Endo/Other  Morbid obesity  Renal/GU      Musculoskeletal   Abdominal (+) + obese,   Peds  Hematology  (+) anemia ,   Anesthesia Other Findings   Reproductive/Obstetrics                            Lab Results  Component Value Date   CREATININE 1.56 (H) 06/02/2018   BUN 37 (H) 06/02/2018   NA 140 06/02/2018   K 4.9 06/02/2018   CL 112 (H) 06/02/2018   CO2 19 (L) 06/02/2018    Lab Results  Component Value Date   WBC 10.3 06/02/2018   HGB 11.3 (L)  06/02/2018   HCT 36.4 06/02/2018   MCV 87.1 06/02/2018   PLT 251 06/02/2018    Anesthesia Physical Anesthesia Plan  ASA: IV  Anesthesia Plan: General   Post-op Pain Management:  Regional for Post-op pain   Induction: Intravenous  PONV Risk Score and Plan: 4 or greater and Treatment may vary due to age or medical condition, Ondansetron and Dexamethasone  Airway Management Planned: Oral ETT  Additional Equipment: Arterial line  Intra-op Plan:   Post-operative Plan: Extubation in OR  Informed Consent: I have reviewed the patients History and Physical, chart, labs and discussed the procedure including the risks, benefits and alternatives for the proposed anesthesia with the patient or authorized representative who has indicated his/her understanding and acceptance.   Dental advisory given  Plan Discussed with:   Anesthesia Plan Comments: (Pectoral;is block )       Anesthesia Quick Evaluation

## 2018-06-08 NOTE — Telephone Encounter (Signed)
Patient left message in person last week.  I called the number on file and got VM.  Left message that I was getting back to her and to please CB.

## 2018-06-09 ENCOUNTER — Ambulatory Visit (HOSPITAL_COMMUNITY): Payer: Medicare Other | Admitting: Anesthesiology

## 2018-06-09 ENCOUNTER — Encounter (HOSPITAL_COMMUNITY)
Admission: RE | Admit: 2018-06-09 | Discharge: 2018-06-09 | Disposition: A | Discharge status: Home / Self Care | Payer: Medicare Other | Source: Ambulatory Visit | Attending: General Surgery | Admitting: General Surgery

## 2018-06-09 ENCOUNTER — Encounter (HOSPITAL_COMMUNITY)
Admission: RE | Disposition: A | Discharge status: Home / Self Care | Payer: Self-pay | Source: Ambulatory Visit | Attending: General Surgery

## 2018-06-09 ENCOUNTER — Ambulatory Visit (HOSPITAL_COMMUNITY): Payer: Medicare Other

## 2018-06-09 ENCOUNTER — Other Ambulatory Visit: Payer: Self-pay

## 2018-06-09 ENCOUNTER — Ambulatory Visit (HOSPITAL_COMMUNITY)
Admission: RE | Admit: 2018-06-09 | Discharge: 2018-06-10 | Disposition: A | Discharge status: Home / Self Care | Payer: Medicare Other | Source: Ambulatory Visit | Attending: General Surgery | Admitting: General Surgery

## 2018-06-09 ENCOUNTER — Encounter (HOSPITAL_COMMUNITY): Payer: Self-pay | Admitting: Orthopedic Surgery

## 2018-06-09 ENCOUNTER — Ambulatory Visit (HOSPITAL_COMMUNITY): Payer: Medicare Other | Admitting: Vascular Surgery

## 2018-06-09 DIAGNOSIS — I428 Other cardiomyopathies: Secondary | ICD-10-CM | POA: Insufficient documentation

## 2018-06-09 DIAGNOSIS — K449 Diaphragmatic hernia without obstruction or gangrene: Secondary | ICD-10-CM | POA: Insufficient documentation

## 2018-06-09 DIAGNOSIS — Z800 Family history of malignant neoplasm of digestive organs: Secondary | ICD-10-CM | POA: Insufficient documentation

## 2018-06-09 DIAGNOSIS — G8918 Other acute postprocedural pain: Secondary | ICD-10-CM | POA: Diagnosis not present

## 2018-06-09 DIAGNOSIS — I471 Supraventricular tachycardia: Secondary | ICD-10-CM | POA: Diagnosis not present

## 2018-06-09 DIAGNOSIS — C50911 Malignant neoplasm of unspecified site of right female breast: Secondary | ICD-10-CM

## 2018-06-09 DIAGNOSIS — I509 Heart failure, unspecified: Secondary | ICD-10-CM | POA: Diagnosis not present

## 2018-06-09 DIAGNOSIS — Z853 Personal history of malignant neoplasm of breast: Secondary | ICD-10-CM | POA: Diagnosis not present

## 2018-06-09 DIAGNOSIS — Z171 Estrogen receptor negative status [ER-]: Secondary | ICD-10-CM | POA: Diagnosis not present

## 2018-06-09 DIAGNOSIS — C50411 Malignant neoplasm of upper-outer quadrant of right female breast: Secondary | ICD-10-CM | POA: Insufficient documentation

## 2018-06-09 DIAGNOSIS — D649 Anemia, unspecified: Secondary | ICD-10-CM | POA: Diagnosis not present

## 2018-06-09 DIAGNOSIS — F329 Major depressive disorder, single episode, unspecified: Secondary | ICD-10-CM | POA: Insufficient documentation

## 2018-06-09 DIAGNOSIS — Z9221 Personal history of antineoplastic chemotherapy: Secondary | ICD-10-CM | POA: Insufficient documentation

## 2018-06-09 DIAGNOSIS — I110 Hypertensive heart disease with heart failure: Secondary | ICD-10-CM | POA: Insufficient documentation

## 2018-06-09 DIAGNOSIS — C50811 Malignant neoplasm of overlapping sites of right female breast: Secondary | ICD-10-CM | POA: Diagnosis present

## 2018-06-09 DIAGNOSIS — Z79899 Other long term (current) drug therapy: Secondary | ICD-10-CM | POA: Insufficient documentation

## 2018-06-09 DIAGNOSIS — Z95828 Presence of other vascular implants and grafts: Secondary | ICD-10-CM

## 2018-06-09 DIAGNOSIS — I10 Essential (primary) hypertension: Secondary | ICD-10-CM | POA: Diagnosis not present

## 2018-06-09 DIAGNOSIS — Z6841 Body Mass Index (BMI) 40.0 and over, adult: Secondary | ICD-10-CM | POA: Insufficient documentation

## 2018-06-09 DIAGNOSIS — Z888 Allergy status to other drugs, medicaments and biological substances status: Secondary | ICD-10-CM | POA: Insufficient documentation

## 2018-06-09 DIAGNOSIS — K219 Gastro-esophageal reflux disease without esophagitis: Secondary | ICD-10-CM | POA: Insufficient documentation

## 2018-06-09 DIAGNOSIS — I081 Rheumatic disorders of both mitral and tricuspid valves: Secondary | ICD-10-CM | POA: Diagnosis not present

## 2018-06-09 DIAGNOSIS — Z452 Encounter for adjustment and management of vascular access device: Secondary | ICD-10-CM | POA: Diagnosis not present

## 2018-06-09 DIAGNOSIS — G709 Myoneural disorder, unspecified: Secondary | ICD-10-CM | POA: Diagnosis not present

## 2018-06-09 DIAGNOSIS — J439 Emphysema, unspecified: Secondary | ICD-10-CM | POA: Diagnosis not present

## 2018-06-09 DIAGNOSIS — F419 Anxiety disorder, unspecified: Secondary | ICD-10-CM | POA: Diagnosis not present

## 2018-06-09 DIAGNOSIS — Z419 Encounter for procedure for purposes other than remedying health state, unspecified: Secondary | ICD-10-CM

## 2018-06-09 DIAGNOSIS — Z886 Allergy status to analgesic agent status: Secondary | ICD-10-CM | POA: Insufficient documentation

## 2018-06-09 HISTORY — PX: MASTECTOMY W/ SENTINEL NODE BIOPSY: SHX2001

## 2018-06-09 HISTORY — PX: PORTACATH PLACEMENT: SHX2246

## 2018-06-09 LAB — CBC
HEMATOCRIT: 32 % — AB (ref 36.0–46.0)
Hemoglobin: 9.8 g/dL — ABNORMAL LOW (ref 12.0–15.0)
MCH: 26.7 pg (ref 26.0–34.0)
MCHC: 30.6 g/dL (ref 30.0–36.0)
MCV: 87.2 fL (ref 78.0–100.0)
PLATELETS: 233 10*3/uL (ref 150–400)
RBC: 3.67 MIL/uL — AB (ref 3.87–5.11)
RDW: 15.1 % (ref 11.5–15.5)
WBC: 11.7 10*3/uL — AB (ref 4.0–10.5)

## 2018-06-09 LAB — CREATININE, SERUM
Creatinine, Ser: 1.05 mg/dL — ABNORMAL HIGH (ref 0.44–1.00)
GFR, EST AFRICAN AMERICAN: 60 mL/min — AB (ref >60)
GFR, EST NON AFRICAN AMERICAN: 52 mL/min — AB (ref >60)

## 2018-06-09 SURGERY — MASTECTOMY WITH SENTINEL LYMPH NODE BIOPSY
Anesthesia: General | Site: Chest | Laterality: Right

## 2018-06-09 MED ORDER — TECHNETIUM TC 99M SULFUR COLLOID FILTERED
1.0000 | Freq: Once | INTRAVENOUS | Status: AC | PRN
  Administered 2018-06-09: 1 via INTRADERMAL

## 2018-06-09 MED ORDER — METOPROLOL SUCCINATE ER 25 MG PO TB24
25.0000 mg | ORAL_TABLET | Freq: Every day | ORAL | Status: DC
  Filled 2018-06-09: qty 1

## 2018-06-09 MED ORDER — SODIUM CHLORIDE 0.9 % IJ SOLN
INTRAMUSCULAR | Status: AC
  Filled 2018-06-09: qty 10

## 2018-06-09 MED ORDER — FOLIC ACID 1 MG PO TABS
1.0000 mg | ORAL_TABLET | Freq: Every day | ORAL | Status: DC
  Administered 2018-06-09: 1 mg via ORAL
  Filled 2018-06-09: qty 1

## 2018-06-09 MED ORDER — WHITE PETROLATUM EX OINT
TOPICAL_OINTMENT | CUTANEOUS | Status: AC
  Administered 2018-06-09: 0.2
  Filled 2018-06-09: qty 28.35

## 2018-06-09 MED ORDER — ROCURONIUM BROMIDE 50 MG/5ML IV SOSY
PREFILLED_SYRINGE | INTRAVENOUS | Status: AC
  Filled 2018-06-09: qty 5

## 2018-06-09 MED ORDER — CLONIDINE HCL (ANALGESIA) 100 MCG/ML EP SOLN
EPIDURAL | Status: DC | PRN
  Administered 2018-06-09: 50 ug

## 2018-06-09 MED ORDER — SUGAMMADEX SODIUM 200 MG/2ML IV SOLN
INTRAVENOUS | Status: DC | PRN
  Administered 2018-06-09: 200 mg via INTRAVENOUS

## 2018-06-09 MED ORDER — CHLORHEXIDINE GLUCONATE CLOTH 2 % EX PADS: 6.0000 | MEDICATED_PAD | Freq: Once | CUTANEOUS | Status: DC

## 2018-06-09 MED ORDER — ETOMIDATE 2 MG/ML IV SOLN
INTRAVENOUS | Status: AC
  Filled 2018-06-09: qty 10

## 2018-06-09 MED ORDER — MONTELUKAST SODIUM 10 MG PO TABS
10.0000 mg | ORAL_TABLET | Freq: Every day | ORAL | Status: DC
  Administered 2018-06-09: 10 mg via ORAL
  Filled 2018-06-09: qty 1

## 2018-06-09 MED ORDER — SODIUM CHLORIDE 0.9 % IV SOLN
INTRAVENOUS | Status: DC | PRN
  Administered 2018-06-09: 20 ug/min via INTRAVENOUS

## 2018-06-09 MED ORDER — ONDANSETRON HCL 4 MG/2ML IJ SOLN
INTRAMUSCULAR | Status: AC
  Filled 2018-06-09: qty 2

## 2018-06-09 MED ORDER — ACETAMINOPHEN 500 MG PO TABS: 1000.0000 mg | ORAL_TABLET | ORAL | Status: DC

## 2018-06-09 MED ORDER — METOPROLOL SUCCINATE ER 25 MG PO TB24: 25.0000 mg | ORAL_TABLET | ORAL | Status: DC

## 2018-06-09 MED ORDER — ONDANSETRON HCL 4 MG/2ML IJ SOLN
INTRAMUSCULAR | Status: DC | PRN
  Administered 2018-06-09: 4 mg via INTRAVENOUS

## 2018-06-09 MED ORDER — HYDROCODONE-ACETAMINOPHEN 7.5-325 MG PO TABS: 1.0000 | ORAL_TABLET | Freq: Once | ORAL | Status: DC | PRN

## 2018-06-09 MED ORDER — LACTATED RINGERS IV SOLN
INTRAVENOUS | Status: DC
  Administered 2018-06-09: 22:00:00 via INTRAVENOUS

## 2018-06-09 MED ORDER — HYDROMORPHONE HCL 1 MG/ML IJ SOLN: 0.2500 mg | INTRAMUSCULAR | Status: DC | PRN

## 2018-06-09 MED ORDER — ALBUMIN HUMAN 5 % IV SOLN
INTRAVENOUS | Status: DC | PRN
  Administered 2018-06-09 (×2): via INTRAVENOUS

## 2018-06-09 MED ORDER — GABAPENTIN 300 MG PO CAPS
ORAL_CAPSULE | ORAL | Status: AC
  Administered 2018-06-09: 300 mg via ORAL
  Filled 2018-06-09: qty 1

## 2018-06-09 MED ORDER — HEPARIN SOD (PORK) LOCK FLUSH 100 UNIT/ML IV SOLN
INTRAVENOUS | Status: DC | PRN
  Administered 2018-06-09: 500 [IU]

## 2018-06-09 MED ORDER — CLONAZEPAM 0.5 MG PO TABS
0.5000 mg | ORAL_TABLET | Freq: Every day | ORAL | Status: DC
  Administered 2018-06-09: 0.5 mg via ORAL
  Filled 2018-06-09: qty 1

## 2018-06-09 MED ORDER — HYDROMORPHONE HCL 1 MG/ML IJ SOLN: 1.0000 mg | INTRAMUSCULAR | Status: DC | PRN

## 2018-06-09 MED ORDER — SODIUM CHLORIDE 0.9 % IV SOLN
INTRAVENOUS | Status: AC
  Filled 2018-06-09: qty 1.2

## 2018-06-09 MED ORDER — PRAVASTATIN SODIUM 10 MG PO TABS
10.0000 mg | ORAL_TABLET | Freq: Every day | ORAL | Status: DC
  Administered 2018-06-09: 10 mg via ORAL
  Filled 2018-06-09: qty 1

## 2018-06-09 MED ORDER — LIDOCAINE 2% (20 MG/ML) 5 ML SYRINGE
INTRAMUSCULAR | Status: AC
  Filled 2018-06-09: qty 5

## 2018-06-09 MED ORDER — SODIUM CHLORIDE 0.9 % IV SOLN
INTRAVENOUS | Status: DC | PRN
  Administered 2018-06-09: 10:00:00

## 2018-06-09 MED ORDER — IOPAMIDOL (ISOVUE-300) INJECTION 61%
INTRAVENOUS | Status: AC
  Filled 2018-06-09: qty 50

## 2018-06-09 MED ORDER — ARTIFICIAL TEARS OPHTHALMIC OINT
TOPICAL_OINTMENT | OPHTHALMIC | Status: AC
  Filled 2018-06-09: qty 3.5

## 2018-06-09 MED ORDER — MIDAZOLAM HCL 2 MG/2ML IJ SOLN
INTRAMUSCULAR | Status: AC
  Filled 2018-06-09: qty 2

## 2018-06-09 MED ORDER — PROCHLORPERAZINE MALEATE 10 MG PO TABS
10.0000 mg | ORAL_TABLET | Freq: Four times a day (QID) | ORAL | Status: DC | PRN
  Filled 2018-06-09: qty 1

## 2018-06-09 MED ORDER — SUCCINYLCHOLINE CHLORIDE 200 MG/10ML IV SOSY
PREFILLED_SYRINGE | INTRAVENOUS | Status: AC
  Filled 2018-06-09: qty 10

## 2018-06-09 MED ORDER — ALBUTEROL SULFATE HFA 108 (90 BASE) MCG/ACT IN AERS
INHALATION_SPRAY | RESPIRATORY_TRACT | Status: AC
  Filled 2018-06-09: qty 6.7

## 2018-06-09 MED ORDER — METHOCARBAMOL 500 MG PO TABS: 500.0000 mg | ORAL_TABLET | Freq: Four times a day (QID) | ORAL | Status: DC | PRN

## 2018-06-09 MED ORDER — METOPROLOL SUCCINATE ER 50 MG PO TB24: 50.0000 mg | ORAL_TABLET | Freq: Every day | ORAL | Status: DC

## 2018-06-09 MED ORDER — SACUBITRIL-VALSARTAN 24-26 MG PO TABS
1.0000 | ORAL_TABLET | Freq: Two times a day (BID) | ORAL | Status: DC
  Filled 2018-06-09 (×2): qty 1

## 2018-06-09 MED ORDER — DEXAMETHASONE SODIUM PHOSPHATE 10 MG/ML IJ SOLN
INTRAMUSCULAR | Status: AC
  Filled 2018-06-09: qty 1

## 2018-06-09 MED ORDER — PROMETHAZINE HCL 25 MG/ML IJ SOLN: 6.2500 mg | INTRAMUSCULAR | Status: DC | PRN

## 2018-06-09 MED ORDER — SUCCINYLCHOLINE CHLORIDE 200 MG/10ML IV SOSY
PREFILLED_SYRINGE | INTRAVENOUS | Status: DC | PRN
  Administered 2018-06-09: 80 mg via INTRAVENOUS

## 2018-06-09 MED ORDER — DOCUSATE SODIUM 100 MG PO CAPS
200.0000 mg | ORAL_CAPSULE | Freq: Two times a day (BID) | ORAL | Status: DC
  Administered 2018-06-09: 200 mg via ORAL
  Filled 2018-06-09: qty 2

## 2018-06-09 MED ORDER — SPIRONOLACTONE 25 MG PO TABS
25.0000 mg | ORAL_TABLET | Freq: Every day | ORAL | Status: DC
  Administered 2018-06-09: 25 mg via ORAL
  Filled 2018-06-09: qty 1

## 2018-06-09 MED ORDER — LACTATED RINGERS IV SOLN
INTRAVENOUS | Status: DC
  Administered 2018-06-09 (×2): via INTRAVENOUS

## 2018-06-09 MED ORDER — GABAPENTIN 300 MG PO CAPS
300.0000 mg | ORAL_CAPSULE | ORAL | Status: AC
  Administered 2018-06-09: 300 mg via ORAL

## 2018-06-09 MED ORDER — HEPARIN SOD (PORK) LOCK FLUSH 100 UNIT/ML IV SOLN
INTRAVENOUS | Status: AC
  Filled 2018-06-09: qty 5

## 2018-06-09 MED ORDER — ETOMIDATE 2 MG/ML IV SOLN
INTRAVENOUS | Status: DC | PRN
  Administered 2018-06-09: 4 mg via INTRAVENOUS
  Administered 2018-06-09: 16 mg via INTRAVENOUS

## 2018-06-09 MED ORDER — ROCURONIUM BROMIDE 100 MG/10ML IV SOLN
INTRAVENOUS | Status: DC | PRN
  Administered 2018-06-09: 30 mg via INTRAVENOUS
  Administered 2018-06-09: 20 mg via INTRAVENOUS

## 2018-06-09 MED ORDER — ACETAMINOPHEN 10 MG/ML IV SOLN: 1000.0000 mg | Freq: Once | INTRAVENOUS | Status: DC | PRN

## 2018-06-09 MED ORDER — LIDOCAINE 2% (20 MG/ML) 5 ML SYRINGE
INTRAMUSCULAR | Status: DC | PRN
  Administered 2018-06-09: 100 mg via INTRAVENOUS

## 2018-06-09 MED ORDER — HYDROCODONE-ACETAMINOPHEN 5-325 MG PO TABS
1.0000 | ORAL_TABLET | ORAL | Status: DC | PRN
  Administered 2018-06-09: 2 via ORAL
  Administered 2018-06-10: 1 via ORAL
  Filled 2018-06-09: qty 1
  Filled 2018-06-09: qty 2

## 2018-06-09 MED ORDER — ROPIVACAINE HCL 5 MG/ML IJ SOLN
INTRAMUSCULAR | Status: DC | PRN
  Administered 2018-06-09: 30 mL

## 2018-06-09 MED ORDER — METHYLENE BLUE 0.5 % INJ SOLN
INTRAVENOUS | Status: AC
  Filled 2018-06-09: qty 10

## 2018-06-09 MED ORDER — SENNA 8.6 MG PO TABS
1.0000 | ORAL_TABLET | Freq: Two times a day (BID) | ORAL | Status: DC
  Administered 2018-06-09: 8.6 mg via ORAL
  Filled 2018-06-09 (×2): qty 1

## 2018-06-09 MED ORDER — CELECOXIB 200 MG PO CAPS
200.0000 mg | ORAL_CAPSULE | Freq: Two times a day (BID) | ORAL | Status: DC
  Administered 2018-06-09: 200 mg via ORAL
  Filled 2018-06-09: qty 1

## 2018-06-09 MED ORDER — MEPERIDINE HCL 50 MG/ML IJ SOLN: 6.2500 mg | INTRAMUSCULAR | Status: DC | PRN

## 2018-06-09 MED ORDER — ZOLPIDEM TARTRATE 5 MG PO TABS
5.0000 mg | ORAL_TABLET | Freq: Every evening | ORAL | Status: DC | PRN
  Administered 2018-06-09: 5 mg via ORAL
  Filled 2018-06-09: qty 1

## 2018-06-09 MED ORDER — BUPIVACAINE-EPINEPHRINE (PF) 0.25% -1:200000 IJ SOLN
INTRAMUSCULAR | Status: AC
  Filled 2018-06-09: qty 30

## 2018-06-09 MED ORDER — SODIUM CHLORIDE 0.9 % IJ SOLN
INTRAVENOUS | Status: DC | PRN
  Administered 2018-06-09: 11:00:00 via INTRAMUSCULAR

## 2018-06-09 MED ORDER — CEFAZOLIN SODIUM-DEXTROSE 2-4 GM/100ML-% IV SOLN
2.0000 g | Freq: Three times a day (TID) | INTRAVENOUS | Status: AC
  Filled 2018-06-09: qty 100

## 2018-06-09 MED ORDER — CEFAZOLIN SODIUM-DEXTROSE 2-4 GM/100ML-% IV SOLN
INTRAVENOUS | Status: AC
  Administered 2018-06-09: 2000 mg
  Filled 2018-06-09: qty 100

## 2018-06-09 MED ORDER — 0.9 % SODIUM CHLORIDE (POUR BTL) OPTIME
TOPICAL | Status: DC | PRN
  Administered 2018-06-09 (×2): 1000 mL

## 2018-06-09 MED ORDER — FENTANYL CITRATE (PF) 250 MCG/5ML IJ SOLN
INTRAMUSCULAR | Status: AC
  Filled 2018-06-09: qty 5

## 2018-06-09 MED ORDER — ENOXAPARIN SODIUM 40 MG/0.4ML ~~LOC~~ SOLN: 40.0000 mg | SUBCUTANEOUS | Status: DC

## 2018-06-09 MED ORDER — FAMOTIDINE IN NACL 20-0.9 MG/50ML-% IV SOLN
20.0000 mg | INTRAVENOUS | Status: DC
  Administered 2018-06-09: 20 mg via INTRAVENOUS
  Filled 2018-06-09: qty 50

## 2018-06-09 MED ORDER — FENTANYL CITRATE (PF) 100 MCG/2ML IJ SOLN
INTRAMUSCULAR | Status: DC | PRN
  Administered 2018-06-09 (×2): 50 ug via INTRAVENOUS
  Administered 2018-06-09: 25 ug via INTRAVENOUS

## 2018-06-09 MED ORDER — ALBUTEROL SULFATE HFA 108 (90 BASE) MCG/ACT IN AERS
INHALATION_SPRAY | RESPIRATORY_TRACT | Status: DC | PRN
  Administered 2018-06-09: 3 via RESPIRATORY_TRACT

## 2018-06-09 MED ORDER — DEXAMETHASONE SODIUM PHOSPHATE 10 MG/ML IJ SOLN
INTRAMUSCULAR | Status: DC | PRN
  Administered 2018-06-09: 10 mg via INTRAVENOUS

## 2018-06-09 MED ORDER — PHENYLEPHRINE 40 MCG/ML (10ML) SYRINGE FOR IV PUSH (FOR BLOOD PRESSURE SUPPORT)
PREFILLED_SYRINGE | INTRAVENOUS | Status: AC
  Filled 2018-06-09: qty 10

## 2018-06-09 MED ORDER — FENTANYL CITRATE (PF) 100 MCG/2ML IJ SOLN
INTRAMUSCULAR | Status: AC
  Administered 2018-06-09: 50 ug via INTRAVENOUS
  Filled 2018-06-09: qty 2

## 2018-06-09 MED ORDER — FENTANYL CITRATE (PF) 100 MCG/2ML IJ SOLN
50.0000 ug | Freq: Once | INTRAMUSCULAR | Status: AC
  Administered 2018-06-09: 50 ug via INTRAVENOUS

## 2018-06-09 MED ORDER — EPHEDRINE 5 MG/ML INJ
INTRAVENOUS | Status: AC
  Filled 2018-06-09: qty 10

## 2018-06-09 MED ORDER — BUPIVACAINE-EPINEPHRINE 0.25% -1:200000 IJ SOLN
INTRAMUSCULAR | Status: DC | PRN
  Administered 2018-06-09: 30 mL

## 2018-06-09 MED ORDER — EPHEDRINE SULFATE-NACL 50-0.9 MG/10ML-% IV SOSY
PREFILLED_SYRINGE | INTRAVENOUS | Status: DC | PRN
  Administered 2018-06-09: 10 mg via INTRAVENOUS
  Administered 2018-06-09 (×2): 5 mg via INTRAVENOUS
  Administered 2018-06-09: 10 mg via INTRAVENOUS

## 2018-06-09 MED ORDER — GABAPENTIN 300 MG PO CAPS
300.0000 mg | ORAL_CAPSULE | Freq: Two times a day (BID) | ORAL | Status: DC
  Administered 2018-06-09: 300 mg via ORAL
  Filled 2018-06-09: qty 1

## 2018-06-09 MED ORDER — PROCHLORPERAZINE EDISYLATE 10 MG/2ML IJ SOLN: 5.0000 mg | Freq: Four times a day (QID) | INTRAMUSCULAR | Status: DC | PRN

## 2018-06-09 MED ORDER — ACETAMINOPHEN 500 MG PO TABS
ORAL_TABLET | ORAL | Status: AC
  Filled 2018-06-09: qty 2

## 2018-06-09 MED ORDER — CEFAZOLIN SODIUM-DEXTROSE 2-4 GM/100ML-% IV SOLN
2.0000 g | INTRAVENOUS | Status: AC
  Administered 2018-06-09: 2 g via INTRAVENOUS

## 2018-06-09 SURGICAL SUPPLY — 85 items
ADH SKN CLS APL DERMABOND .7 (GAUZE/BANDAGES/DRESSINGS) ×6
APPLIER CLIP 9.375 MED OPEN (MISCELLANEOUS) ×3
APR CLP MED 9.3 20 MLT OPN (MISCELLANEOUS) ×2
BAG DECANTER FOR FLEXI CONT (MISCELLANEOUS) ×3 IMPLANT
BINDER BREAST LRG (GAUZE/BANDAGES/DRESSINGS) ×1 IMPLANT
BINDER BREAST XLRG (GAUZE/BANDAGES/DRESSINGS) ×1 IMPLANT
BIOPATCH RED 1 DISK 7.0 (GAUZE/BANDAGES/DRESSINGS) ×2 IMPLANT
BLADE CLIPPER SURG (BLADE) IMPLANT
CANISTER SUCT 3000ML PPV (MISCELLANEOUS) ×3 IMPLANT
CHLORAPREP W/TINT 26ML (MISCELLANEOUS) ×4 IMPLANT
CLIP APPLIE 9.375 MED OPEN (MISCELLANEOUS) ×2 IMPLANT
CONT SPEC 4OZ CLIKSEAL STRL BL (MISCELLANEOUS) ×2 IMPLANT
COVER MAYO STAND STRL (DRAPES) ×1 IMPLANT
COVER PROBE W GEL 5X96 (DRAPES) ×3 IMPLANT
COVER SURGICAL LIGHT HANDLE (MISCELLANEOUS) ×4 IMPLANT
COVER TRANSDUCER ULTRASND GEL (DRAPE) ×1 IMPLANT
CRADLE DONUT ADULT HEAD (MISCELLANEOUS) ×3 IMPLANT
DERMABOND ADVANCED (GAUZE/BANDAGES/DRESSINGS) ×3
DERMABOND ADVANCED .7 DNX12 (GAUZE/BANDAGES/DRESSINGS) ×2 IMPLANT
DEVICE DISSECT PLASMABLAD 3.0S (MISCELLANEOUS) IMPLANT
DRAIN CHANNEL 19F RND (DRAIN) ×4 IMPLANT
DRAPE C-ARM 42X72 X-RAY (DRAPES) ×3 IMPLANT
DRAPE CHEST BREAST 15X10 FENES (DRAPES) ×1 IMPLANT
DRAPE HALF SHEET 40X57 (DRAPES) ×3 IMPLANT
DRAPE LAPAROSCOPIC ABDOMINAL (DRAPES) ×3 IMPLANT
DRAPE UTILITY XL STRL (DRAPES) ×2 IMPLANT
DRSG TEGADERM 4X4.75 (GAUZE/BANDAGES/DRESSINGS) ×1 IMPLANT
ELECT BLADE 4.0 EZ CLEAN MEGAD (MISCELLANEOUS) ×3
ELECT CAUTERY BLADE 6.4 (BLADE) ×4 IMPLANT
ELECT REM PT RETURN 9FT ADLT (ELECTROSURGICAL) ×3
ELECTRODE BLDE 4.0 EZ CLN MEGD (MISCELLANEOUS) ×2 IMPLANT
ELECTRODE REM PT RTRN 9FT ADLT (ELECTROSURGICAL) ×2 IMPLANT
EVACUATOR SILICONE 100CC (DRAIN) ×4 IMPLANT
GAUZE 4X4 16PLY RFD (DISPOSABLE) ×3 IMPLANT
GAUZE SPONGE 4X4 12PLY STRL LF (GAUZE/BANDAGES/DRESSINGS) ×1 IMPLANT
GLOVE BIOGEL PI IND STRL 7.0 (GLOVE) IMPLANT
GLOVE BIOGEL PI IND STRL 8 (GLOVE) IMPLANT
GLOVE BIOGEL PI INDICATOR 7.0 (GLOVE) ×2
GLOVE BIOGEL PI INDICATOR 8 (GLOVE) ×2
GLOVE EUDERMIC 7 POWDERFREE (GLOVE) ×4 IMPLANT
GLOVE SS BIOGEL STRL SZ 7 (GLOVE) IMPLANT
GLOVE SUPERSENSE BIOGEL SZ 7 (GLOVE) ×1
GLOVE SURG SS PI 6.5 STRL IVOR (GLOVE) ×1 IMPLANT
GLOVE SURG SYN 8.0 (GLOVE) ×6 IMPLANT
GLOVE SURG SYN 8.0 PF PI (GLOVE) IMPLANT
GOWN STRL REUS W/ TWL LRG LVL3 (GOWN DISPOSABLE) ×2 IMPLANT
GOWN STRL REUS W/ TWL XL LVL3 (GOWN DISPOSABLE) ×2 IMPLANT
GOWN STRL REUS W/TWL LRG LVL3 (GOWN DISPOSABLE) ×3
GOWN STRL REUS W/TWL XL LVL3 (GOWN DISPOSABLE) ×6
ILLUMINATOR WAVEGUIDE N/F (MISCELLANEOUS) IMPLANT
KIT BASIN OR (CUSTOM PROCEDURE TRAY) ×3 IMPLANT
KIT PORT POWER 8FR ISP CVUE (Port) ×1 IMPLANT
KIT TURNOVER KIT B (KITS) ×3 IMPLANT
LIGHT WAVEGUIDE WIDE FLAT (MISCELLANEOUS) IMPLANT
MANIFOLD NEPTUNE WASTE (CANNULA) ×1 IMPLANT
NDL 18GX1X1/2 (RX/OR ONLY) (NEEDLE) ×2 IMPLANT
NDL FILTER BLUNT 18X1 1/2 (NEEDLE) ×2 IMPLANT
NDL HYPO 25GX1X1/2 BEV (NEEDLE) ×2 IMPLANT
NEEDLE 18GX1X1/2 (RX/OR ONLY) (NEEDLE) ×3 IMPLANT
NEEDLE FILTER BLUNT 18X 1/2SAF (NEEDLE) ×1
NEEDLE FILTER BLUNT 18X1 1/2 (NEEDLE) ×2 IMPLANT
NEEDLE HYPO 25GX1X1/2 BEV (NEEDLE) ×6 IMPLANT
NS IRRIG 1000ML POUR BTL (IV SOLUTION) ×3 IMPLANT
PACK GENERAL/GYN (CUSTOM PROCEDURE TRAY) ×3 IMPLANT
PAD ABD 8X10 STRL (GAUZE/BANDAGES/DRESSINGS) ×2 IMPLANT
PAD ARMBOARD 7.5X6 YLW CONV (MISCELLANEOUS) ×3 IMPLANT
PENCIL BUTTON HOLSTER BLD 10FT (ELECTRODE) ×2 IMPLANT
PENCIL SMOKE EVACUATOR (MISCELLANEOUS) ×1 IMPLANT
PLASMABLADE 3.0S (MISCELLANEOUS)
SLEEVE SUCTION 125 (MISCELLANEOUS) ×1 IMPLANT
SPECIMEN JAR X LARGE (MISCELLANEOUS) ×3 IMPLANT
SPONGE LAP 18X18 RF (DISPOSABLE) ×1 IMPLANT
SURGILUBE 3G PEEL PACK STRL (MISCELLANEOUS) IMPLANT
SUT ETHILON 3 0 FSL (SUTURE) ×4 IMPLANT
SUT MNCRL AB 4-0 PS2 18 (SUTURE) ×4 IMPLANT
SUT PROLENE 2 0 CT2 30 (SUTURE) ×3 IMPLANT
SUT SILK 2 0 PERMA HAND 18 BK (SUTURE) ×3 IMPLANT
SUT VIC AB 3-0 SH 18 (SUTURE) ×4 IMPLANT
SYR 10ML LL (SYRINGE) ×6 IMPLANT
SYR 5ML LUER SLIP (SYRINGE) ×3 IMPLANT
SYR CONTROL 10ML LL (SYRINGE) ×4 IMPLANT
TOWEL OR 17X24 6PK STRL BLUE (TOWEL DISPOSABLE) ×2 IMPLANT
TOWEL OR 17X26 10 PK STRL BLUE (TOWEL DISPOSABLE) ×3 IMPLANT
TUBE CONNECTING 12X1/4 (SUCTIONS) IMPLANT
YANKAUER SUCT BULB TIP NO VENT (SUCTIONS) IMPLANT

## 2018-06-09 NOTE — Anesthesia Procedure Notes (Signed)
Procedure Name: Intubation Date/Time: 06/09/2018 9:55 AM Performed by: Gwyndolyn Saxon, CRNA Pre-anesthesia Checklist: Patient identified, Emergency Drugs available, Suction available and Patient being monitored Patient Re-evaluated:Patient Re-evaluated prior to induction Oxygen Delivery Method: Circle System Utilized Preoxygenation: Pre-oxygenation with 100% oxygen Induction Type: IV induction Ventilation: Oral airway inserted - appropriate to patient size and Mask ventilation with difficulty Laryngoscope Size: Mac and 3 Grade View: Grade II Tube type: Oral Number of attempts: 1 Airway Equipment and Method: Stylet and Oral airway Placement Confirmation: ETT inserted through vocal cords under direct vision,  positive ETCO2 and breath sounds checked- equal and bilateral Secured at: 21 cm Tube secured with: Tape Dental Injury: Teeth and Oropharynx as per pre-operative assessment  Difficulty Due To: Difficult Airway- due to limited oral opening Comments: Intubated by SRNA.  After induction, patient difficult to mask ventilate due to limited mouth opening.  RSI initiated.  Ventilation achieved.

## 2018-06-09 NOTE — Anesthesia Procedure Notes (Signed)
Arterial Line Insertion Start/End9/18/2019 9:51 AM, 06/09/2018 9:53 AM Performed by: Gwyndolyn Saxon, CRNA, CRNA  Patient location: OR. Preanesthetic checklist: patient identified, IV checked, site marked, risks and benefits discussed, surgical consent, monitors and equipment checked, pre-op evaluation, timeout performed and anesthesia consent Lidocaine 1% used for infiltration radial was placed Catheter size: 20 Fr Hand hygiene performed  and maximum sterile barriers used   Attempts: 1 Procedure performed without using ultrasound guided technique. Following insertion, dressing applied and Biopatch. Post procedure assessment: normal and unchanged  Patient tolerated the procedure well with no immediate complications.

## 2018-06-09 NOTE — Transfer of Care (Signed)
Immediate Anesthesia Transfer of Care Note  Patient: Tara [name]  Procedure(s) Performed: RIGHT TOTAL MASTECTOMY WITH SENTINEL LYMPH NODE BIOPSY (Right Breast) INSERTION PORT-A-CATH (N/A Chest)  Patient Location: PACU  Anesthesia Type:General and Regional  Level of Consciousness: awake, alert  and oriented  Airway & Oxygen Therapy: Patient Spontanous Breathing and Patient connected to face mask oxygen  Post-op Assessment: Report given to RN and Post -op Vital signs reviewed and stable  Post vital signs: Reviewed and stable  Last Vitals:  Vitals Value Taken Time  BP 125/59 06/09/2018 12:17 PM  Temp    Pulse 69 06/09/2018 12:20 PM  Resp 14 06/09/2018 12:20 PM  SpO2 94 % 06/09/2018 12:20 PM  Vitals shown include unvalidated device data.  Last Pain:  Vitals:   06/09/18 0820  TempSrc: Oral         Complications: No apparent anesthesia complications

## 2018-06-09 NOTE — Discharge Instructions (Signed)
[facility] [place] °[phone] ° °MASTECTOMY: POST OP INSTRUCTIONS ° °Always review your discharge instruction sheet given to you by the facility where your surgery was performed. °IF YOU HAVE DISABILITY OR FAMILY LEAVE FORMS, YOU MUST BRING THEM TO THE OFFICE FOR PROCESSING.   °DO NOT GIVE THEM TO YOUR DOCTOR. °A prescription for pain medication may be given to you upon discharge.  Take your pain medication as prescribed, if needed.  If narcotic pain medicine is not needed, then you may take acetaminophen (Tylenol) or ibuprofen (Advil) as needed. °1. Take your usually prescribed medications unless otherwise directed. °2. If you need a refill on your pain medication, please contact your pharmacy.  They will contact our office to request authorization.  Prescriptions will not be filled after 5pm or on week-ends. °3. You should follow a light diet the first few days after arrival home, such as soup and crackers, etc.  Resume your normal diet the day after surgery. °4. Most patients will experience some swelling and bruising on the chest and underarm.  Ice packs will help.  Swelling and bruising can take several days to resolve.  °5. It is common to experience some constipation if taking pain medication after surgery.  Increasing fluid intake and taking a stool softener (such as Colace) will usually help or prevent this problem from occurring.  A mild laxative (Milk of Magnesia or Miralax) should be taken according to package instructions if there are no bowel movements after 48 hours. °6. Unless discharge instructions indicate otherwise, leave your bandage dry and in place until your next appointment in 3-5 days.  You may take a limited sponge bath.  No tube baths or showers until the drains are removed.  You may have steri-strips (small skin tapes) in place directly over the incision.  These strips should be left on the skin for 7-10 days.  If your surgeon used skin glue on the incision, you may  shower in 24 hours.  The glue will flake off over the next 2-3 weeks.  Any sutures or staples will be removed at the office during your follow-up visit. °7. DRAINS:  If you have drains in place, it is important to keep a list of the amount of drainage produced each day in your drains.  Before leaving the hospital, you should be instructed on drain care.  Call our office if you have any questions about your drains. °8. ACTIVITIES:  You may resume regular (light) daily activities beginning the next day--such as daily self-care, walking, climbing stairs--gradually increasing activities as tolerated.  You may have sexual intercourse when it is comfortable.  Refrain from any heavy lifting or straining until approved by your doctor. °a. You may drive when you are no longer taking prescription pain medication, you can comfortably wear a seatbelt, and you can safely maneuver your car and apply brakes. °b. RETURN TO WORK:  __________________________________________________________ °9. You should see your doctor in the office for a follow-up appointment approximately 3-5 days after your surgery.  Your doctor’s nurse will typically make your follow-up appointment when she calls you with your pathology report.  Expect your pathology report 2-3 business days after your surgery.  You may call to check if you do not hear from us after three days.   °10. OTHER INSTRUCTIONS: ______________________________________________________________________________________________ ____________________________________________________________________________________________ °WHEN TO CALL YOUR DOCTOR: °1. Fever over 101.0 °2. Nausea and/or vomiting °3. Extreme swelling or bruising °4. Continued bleeding from incision. °5. Increased pain, redness, or drainage from the incision. °  The clinic staff is available to answer your questions during regular business hours.  Please don’t hesitate to call and ask to speak to one of the nurses for clinical  concerns.  If you have a medical emergency, go to the nearest emergency room or call 911.  A surgeon from [facility] is always on call at the hospital. °[address], [address], [place], [place]  [address] ? [address] [address], , [place]   [address] °[phone] ? [phone] ? FAX [phone] °Web site: [email or web address] °

## 2018-06-09 NOTE — Interval H&P Note (Signed)
History and Physical Interval Note:  06/09/2018 9:02 AM  Tara [name]  has presented today for surgery, with the diagnosis of RIGHT BREAST CANCER  The various methods of treatment have been discussed with the patient and family. After consideration of risks, benefits and other options for treatment, the patient has consented to  Procedure(s): RIGHT TOTAL MASTECTOMY WITH SENTINEL LYMPH NODE BIOPSY (Right) INSERTION PORT-A-CATH (N/A) as a surgical intervention .  The patient's history has been reviewed, patient examined, no change in status, stable for surgery.  I have reviewed the patient's chart and labs.  Questions were answered to the patient's satisfaction.     Adin Hector

## 2018-06-09 NOTE — Plan of Care (Signed)
  Problem: Activity: Goal: Risk for activity intolerance will decrease Outcome: Progressing   Problem: Nutrition: Goal: Adequate nutrition will be maintained Outcome: Progressing   Problem: Pain Managment: Goal: General experience of comfort will improve Outcome: Progressing   Problem: Safety: Goal: Ability to remain free from injury will improve Outcome: Progressing   Problem: Skin Integrity: Goal: Risk for impaired skin integrity will decrease Outcome: Progressing   Problem: Clinical Measurements: Goal: Postoperative complications will be avoided or minimized Outcome: Progressing   Problem: Pain Management: Goal: Expressions of feelings of enhanced comfort will increase Outcome: Progressing

## 2018-06-09 NOTE — Anesthesia Procedure Notes (Signed)
Anesthesia Regional Block: Pectoralis block   Pre-Anesthetic Checklist: ,, timeout performed, Correct Patient, Correct Site, Correct Laterality, Correct Procedure, Correct Position, site marked, Risks and benefits discussed,  Surgical consent,  Pre-op evaluation,  At surgeon's request and post-op pain management  Laterality: Right  Prep: chloraprep       Needles:  Injection technique: Single-shot  Needle Type: Echogenic Needle     Needle Length: 9cm  Needle Gauge: 21     Additional Needles:   Narrative:  Start time: 06/09/2018 9:00 AM End time: 06/09/2018 9:12 AM Injection made incrementally with aspirations every 5 mL.  Performed by: Personally  Anesthesiologist: Barnet Glasgow, MD

## 2018-06-09 NOTE — Anesthesia Postprocedure Evaluation (Signed)
Anesthesia Post Note  Patient: Tara [name]  Procedure(s) Performed: RIGHT TOTAL MASTECTOMY WITH SENTINEL LYMPH NODE BIOPSY (Right Breast) INSERTION PORT-A-CATH (N/A Chest)     Patient location during evaluation: PACU Anesthesia Type: General Level of consciousness: awake and alert Pain management: pain level controlled Vital Signs Assessment: post-procedure vital signs reviewed and stable Respiratory status: spontaneous breathing, nonlabored ventilation, respiratory function stable and patient connected to nasal cannula oxygen Cardiovascular status: blood pressure returned to baseline and stable Postop Assessment: no apparent nausea or vomiting Anesthetic complications: no    Last Vitals:  Vitals:   06/09/18 0920 06/09/18 1215  BP: 120/62   Pulse: (!) 55   Resp: 20   Temp:  (!) 36.1 C  SpO2: 99%     Last Pain:  Vitals:   06/09/18 1215  TempSrc:   PainSc: 0-No pain                 Barnet Glasgow

## 2018-06-09 NOTE — Op Note (Signed)
Patient Name:           Tara [name]   Date of Surgery:        06/09/2018  Pre op Diagnosis:      Recurrent cancer right breast  Post op Diagnosis:    Recurrent cancer right breast  Procedure:                 Insertion power point clear view 8 French tunneled venous vascular access device                                      Use of fluoroscopy for guidance and positioning                                       Inject blue dye right breast                                       Right total mastectomy                                       Right axillary deep sentinel lymph node biopsy  Surgeon:                     Tara [name], M.D., FACS  Assistant:                      RNFA   Indication for Assistant: Provide exposure.  Expedite procedure  Operative Indications:   This is a 72 year old female who is brought to the operating room for definitive surgery for her recurrent right breast cancer.  . Dr. Lindi [name] is her oncologist. Tara [name] is her PCP. Dr. Rayann [name] is her cardiologist.       Right breast cancer diagnosed in 2008. Tara [name] performed lumpectomy and ultimately went back for complete right axillary lymph node dissection with 3 out of 10 lymph nodes positive. She received adjuvant radiation therapy and adjuvant chemotherapy and the port was removed. This was difficult on her.      I  performed a right breast biopsy up her inner quadrant 1 year ago which turned out to be benign CSL.      Imaging studies on March 26, 2018 showed 3 possible masses in the right breast, upper outer quadrant. 2 at the 10 o'clock position and one at the 9 o'clock position. Axillary ultrasound negative. 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 She has seen Dr. Lindi [name] and  we have discussed this in breast conference.  Staging scans with bone scan and CT are negative.  Consensus recommendation  is Port-A-Cath, salvage mastectomy possible sentinel node biopsy and adjuvant chemotherapy. Genetic counseling was recommended.      Comorbidities include previous right breast surgery and chemotherapy 2008. History of SVT and nonischemic cardiomyopathy. She has seen Dr. Rayann [name] and he has given cardiac clearance for mastectomy. Hypertension. Anxiety and depression. Elevated BMI We had a long discussion. She is basically willing to undergo right total mastectomy, attempted right sentinel lymph node biopsy and Port-A-Cath  insertion. She saw plastics and decided against reconstruction.  Operative Findings:       The port was inserted through the left subclavian vein without difficulty.  The catheter tip appears to be in the SVC near the right atrial junction.  Excellent blood return and flushes easily.  Right mastectomy surgery was uneventful although the skin and breast tissues showed obvious thickening and edema from previous radiation.  There was very little radioactivity or blue dye in the right axilla.  There was no palpable mass in the right axilla.  The right axilla was scarred in from previous axillary surgery.  I basically took the tail of Spence and the level 1 lymph nodes as the sentinel node specimen.  Procedure in Detail:          The patient underwent pectoral block in the holding area.  Nuclear medicine injection was performed in the holding area.  The patient was brought to the operating room and underwent general endotracheal anesthesia.  Intravenous antibiotics were given.  Surgical timeout was performed.  The patient was positioned with a roll behind her shoulders and her arms tucked at her sides.  We had a taper very large breast inferiorly to get out of the way of the clavicles.  0.5% Marcaine with epinephrine was used as a local infiltration anesthetic.     Left subclavian venipuncture was performed with good blood return on the first pass.  Guidewire was inserted without  difficulty.  Fluoroscopy confirmed that the guidewire was down the superior vena cava into the inferior vena cava.  The heart appeared to be positioned transversely on the diaphragm.  A small incision was made at the wire insertion site.  A transverse incision was made below the left clavicle.  This was through the old scar.  Subcutaneous pocket was created.  Using a tunneling device I passed the catheter from the port pocket site to the wire insertion site.  Using fluoroscopy I drew a template on the chest wall to guide positioning and length of the catheter.  I cut the catheter 27 cm in length which looked like it would be in the superior vena cava near the right atrial junction.  The port was then secured to the catheter with the locking device and the port and catheter flushed with heparinized saline.  The port was sutured to the pectoralis fascia with Prolene sutures.  The dilator and peel-away sheath assembly were inserted over the guidewire without difficulty.  The guidewire and dilator were removed.  The catheter threaded easily through the peel-away sheath and the peel-away sheath removed.  I had excellent blood return and the catheter flushed easily.  The port and catheter were flushed with concentrated heparin.  Fluoroscopy showed that the catheter appeared to be well positioned with the tip near the right atrium and no deformity of the catheter anywhere.  There was no bleeding from these wounds.  Subcutaneous tissue was closed with 3-0 Vicryl and the skin incision were closed with subcuticular 4-0 Monocryl and Dermabond.      The patient drapes were removed.  The arms were brought out to the sides.  The neck and chest were then reprepped and redraped in a sterile fashion.  Using a marking pen I carefully planned the right mastectomy transverse elliptical incision.  Once I had this planned out I made a transverse elliptical incision with a knife.  Skin flaps were raised superiorly to the infraclavicular  area, medially to the parasternal area, inferiorly to the  anterior rectus sheath and laterally to the anterior border latissimus dorsi muscle.  A silk suture was placed in the lateral skin margin to orient the pathologist.  The breast was then dissected off of the pectoralis major and minor muscles.  Dissection was carried up into the tail of Spence.  Using the neoprobe I searched and there was just a little bit of radioactivity in the tail of Spence.  I completed the dissection.  I removed the specimen.  There was no palpable mass in the right axilla.  The wound was copiously irrigated.  Hemostasis was excellent and achieved with electrocautery and a few metal clips.  Two 42 French Blake drains were placed one up into the right axillary area and one across the skin flaps.  These were brought out through separate stab incisions inferolaterally, sutured to the skin with nylon sutures and connected to suction bulbs.  The mastectomy incision was closed transversely in 2 layers.  Subcutaneous tissue was closed with interrupted 3-0 Vicryl and the skin closed with a running subcuticular 4-0 Monocryl and Dermabond.  Clean bandages and a breast binder were placed.  The patient tolerated the procedure well was taken to PACU in stable condition.  EBL 100 cc or less.  Counts correct.  Complications none.     Addendum: I logged onto the [employer] and reviewed her prescription medication history     Nayellie Sanseverino M. Dalbert [name], M.D., FACS General and Minimally Invasive Surgery Breast and Colorectal Surgery  06/09/2018 11:55 AM

## 2018-06-10 ENCOUNTER — Encounter (HOSPITAL_COMMUNITY): Payer: Self-pay | Admitting: General Surgery

## 2018-06-10 ENCOUNTER — Other Ambulatory Visit: Payer: Self-pay

## 2018-06-10 ENCOUNTER — Other Ambulatory Visit: Payer: Self-pay | Admitting: Family Medicine

## 2018-06-10 DIAGNOSIS — K219 Gastro-esophageal reflux disease without esophagitis: Secondary | ICD-10-CM | POA: Diagnosis not present

## 2018-06-10 DIAGNOSIS — Z800 Family history of malignant neoplasm of digestive organs: Secondary | ICD-10-CM | POA: Diagnosis not present

## 2018-06-10 DIAGNOSIS — Z853 Personal history of malignant neoplasm of breast: Secondary | ICD-10-CM | POA: Diagnosis not present

## 2018-06-10 DIAGNOSIS — I110 Hypertensive heart disease with heart failure: Secondary | ICD-10-CM | POA: Diagnosis not present

## 2018-06-10 DIAGNOSIS — Z6841 Body Mass Index (BMI) 40.0 and over, adult: Secondary | ICD-10-CM | POA: Diagnosis not present

## 2018-06-10 DIAGNOSIS — I509 Heart failure, unspecified: Secondary | ICD-10-CM | POA: Diagnosis not present

## 2018-06-10 DIAGNOSIS — C50411 Malignant neoplasm of upper-outer quadrant of right female breast: Secondary | ICD-10-CM | POA: Diagnosis not present

## 2018-06-10 DIAGNOSIS — Z9221 Personal history of antineoplastic chemotherapy: Secondary | ICD-10-CM | POA: Diagnosis not present

## 2018-06-10 DIAGNOSIS — Z171 Estrogen receptor negative status [ER-]: Secondary | ICD-10-CM | POA: Diagnosis not present

## 2018-06-10 DIAGNOSIS — I081 Rheumatic disorders of both mitral and tricuspid valves: Secondary | ICD-10-CM | POA: Diagnosis not present

## 2018-06-10 DIAGNOSIS — F419 Anxiety disorder, unspecified: Secondary | ICD-10-CM | POA: Diagnosis not present

## 2018-06-10 DIAGNOSIS — K449 Diaphragmatic hernia without obstruction or gangrene: Secondary | ICD-10-CM | POA: Diagnosis not present

## 2018-06-10 DIAGNOSIS — F329 Major depressive disorder, single episode, unspecified: Secondary | ICD-10-CM | POA: Diagnosis not present

## 2018-06-10 DIAGNOSIS — D649 Anemia, unspecified: Secondary | ICD-10-CM | POA: Diagnosis not present

## 2018-06-10 DIAGNOSIS — G709 Myoneural disorder, unspecified: Secondary | ICD-10-CM | POA: Diagnosis not present

## 2018-06-10 LAB — BASIC METABOLIC PANEL
ANION GAP: 6 (ref 5–15)
BUN: 19 mg/dL (ref 8–23)
CHLORIDE: 111 mmol/L (ref 98–111)
CO2: 23 mmol/L (ref 22–32)
Calcium: 8 mg/dL — ABNORMAL LOW (ref 8.9–10.3)
Creatinine, Ser: 1.48 mg/dL — ABNORMAL HIGH (ref 0.44–1.00)
GFR calc Af Amer: 40 mL/min — ABNORMAL LOW (ref >60)
GFR calc non Af Amer: 34 mL/min — ABNORMAL LOW (ref >60)
GLUCOSE: 104 mg/dL — AB (ref 70–99)
POTASSIUM: 3.9 mmol/L (ref 3.5–5.1)
Sodium: 140 mmol/L (ref 135–145)

## 2018-06-10 LAB — CBC
HCT: 29.6 % — ABNORMAL LOW (ref 36.0–46.0)
HEMOGLOBIN: 9 g/dL — AB (ref 12.0–15.0)
MCH: 26 pg (ref 26.0–34.0)
MCHC: 30.4 g/dL (ref 30.0–36.0)
MCV: 85.5 fL (ref 78.0–100.0)
Platelets: 223 10*3/uL (ref 150–400)
RBC: 3.46 MIL/uL — ABNORMAL LOW (ref 3.87–5.11)
RDW: 15 % (ref 11.5–15.5)
WBC: 10 10*3/uL (ref 4.0–10.5)

## 2018-06-10 MED ORDER — HYDROCODONE-ACETAMINOPHEN 5-325 MG PO TABS: 1.0000 | ORAL_TABLET | Freq: Four times a day (QID) | ORAL | 0 refills | Status: DC | PRN

## 2018-06-10 NOTE — Plan of Care (Signed)
  Problem: Education: °Goal: Knowledge of General Education information will improve °Description: Including pain rating scale, medication(s)/side effects and non-pharmacologic comfort measures °Outcome: Adequate for Discharge °  °Problem: Health Behavior/Discharge Planning: °Goal: Ability to manage health-related needs will improve °Outcome: Adequate for Discharge °  °Problem: Clinical Measurements: °Goal: Ability to maintain clinical measurements within normal limits will improve °Outcome: Adequate for Discharge °Goal: Will remain free from infection °Outcome: Adequate for Discharge °Goal: Diagnostic test results will improve °Outcome: Adequate for Discharge °Goal: Respiratory complications will improve °Outcome: Adequate for Discharge °Goal: Cardiovascular complication will be avoided °Outcome: Adequate for Discharge °  °Problem: Activity: °Goal: Risk for activity intolerance will decrease °Outcome: Adequate for Discharge °  °Problem: Nutrition: °Goal: Adequate nutrition will be maintained °Outcome: Adequate for Discharge °  °Problem: Coping: °Goal: Level of anxiety will decrease °Outcome: Adequate for Discharge °  °Problem: Elimination: °Goal: Will not experience complications related to bowel motility °Outcome: Adequate for Discharge °Goal: Will not experience complications related to urinary retention °Outcome: Adequate for Discharge °  °Problem: Pain Managment: °Goal: General experience of comfort will improve °Outcome: Adequate for Discharge °  °Problem: Safety: °Goal: Ability to remain free from injury will improve °Outcome: Adequate for Discharge °  °Problem: Skin Integrity: °Goal: Risk for impaired skin integrity will decrease °Outcome: Adequate for Discharge °  °Problem: Education: °Goal: Knowledge of disease or condition will improve °Outcome: Adequate for Discharge °  °Problem: Activity: °Goal: Ability to maintain or regain function will improve °Outcome: Adequate for Discharge °  °Problem: Clinical  Measurements: °Goal: Postoperative complications will be avoided or minimized °Outcome: Adequate for Discharge °  °Problem: Self-Concept: °Goal: Ability to verbalize positive feelings about self will improve °Outcome: Adequate for Discharge °  °Problem: Pain Management: °Goal: Expressions of feelings of enhanced comfort will increase °Outcome: Adequate for Discharge °  °Problem: Skin Integrity: °Goal: Demonstration of wound healing without infection will improve °Outcome: Adequate for Discharge °  °

## 2018-06-10 NOTE — Progress Notes (Signed)
[name] to be D/C'd  per MD order. Discussed with the patient and all questions fully answered.  VSS, Skin clean, dry and intact without evidence of skin break down, no evidence of skin tears noted.  IV catheter discontinued intact. Site without signs and symptoms of complications. Dressing and pressure applied.  An After Visit Summary was printed and given to the patient. Patient received prescription.  D/c education completed with patient/family including follow up instructions, medication list, d/c activities limitations if indicated, with other d/c instructions as indicated by MD - patient able to verbalize understanding, all questions fully answered.   Patient instructed to return to ED, call 911, or call MD for any changes in condition.   Patient to be escorted via [place], and D/C home via private auto.

## 2018-06-10 NOTE — Discharge Summary (Addendum)
Patient ID: Tara [name] [id number] 72 y.o. 07/23/1946  Admit date: 06/09/2018  Discharge date and time: No discharge date for patient encounter.  Admitting Physician: Adin Hector  Discharge Physician: Adin Hector  Admission Diagnoses: RIGHT BREAST CANCER  Discharge Diagnoses: Recurrent right breast cancer                                         Nonischemic cardiomyopathy                                         History supraventricular tachycardia                                         BMI 39  Operations: Procedure(s): RIGHT TOTAL MASTECTOMY WITH SENTINEL LYMPH NODE BIOPSY INSERTION PORT-A-CATH  Admission Condition: good  Discharged Condition: good  Indication for Admission: This is a 72 year old female who is admitted following  definitive surgery for her recurrent right breast cancer.  . Dr. Lindi Adie is her oncologist. [name] is her PCP. Dr. Rayann Heman is her cardiologist.  Right breast cancer was diagnosed in 2008. Marylene Buerger performed lumpectomy and ultimately went back for complete right axillary lymph node dissection with 3 out of 10 lymph nodes positive. She received adjuvant radiation therapy and adjuvant chemotherapy and the port was removed. This was difficult on her. Iperformed a right breast biopsy up her inner quadrant 1 year ago which turned out to be benign CSL. Imaging studies on March 26, 2018 showed 3 possible masses in the right breast, upper outer quadrant. 2 at the 10 o'clock position and one at the 9 o'clock position. Axillary ultrasound negative. 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 She has seen Dr. Lindi Adie and  we have discussed this in breast conference.  Staging scans with bone scan and CT are negative.  Consensus recommendation is Port-A-Cath, salvage mastectomy possible sentinel node biopsy and adjuvant  chemotherapy. Genetic counseling was recommended. Comorbidities include previous right breast surgery and chemotherapy 2008. History of SVT and nonischemic cardiomyopathy. She has seen Dr. Rayann Heman and he has given cardiac clearance for mastectomy. Hypertension. Anxiety and depression. Elevated BMI We had a long discussion. She is basically willing to undergo right total mastectomy, attempted right sentinel lymph node biopsy and Port-A-Cath insertion. She saw plastics and decided against reconstruction.  Hospital Course: On the day of admission the patient was taken to the operating room.  She underwent Port-A-Cath insertion through the left subclavian vein which was uneventful.  She underwent right total mastectomy and removal of some level 1 lymph nodes at the tail of Spence.  The sentinel node mapping procedure was not very successful due to the previous axillary surgery.  Postop chest x-ray showed the Port-A-Cath to be in good position with the tip just inside the right atrium.  She had no arrhythmias.  She was admitted overnight.    On postop day 1 she was doing very well.  Her husband was present in the room with her.  She had been able to get out of bed and ambulate and void uneventfully.  She was tolerating diet  without difficulty.  Pain control was very good.  She had no chest pain or shortness of breath.  Heart rate was very regular without ectopy.  Urine output was good.  Examination of the port site showed no hematoma.  Examination of the mastectomy site showed healthy skin flaps.  No hematoma or fluid collection.  Both drains were functioning with low volume serosanguineous output.     She wanted to go home and was discharged home.  She was told to take Tylenol for pain and we called in a supplemental prescription for Norco to her pharmacy if she needed it.  Diet and activities were discussed.  Wound care and drain care and shoulder range of motion exercises were discussed.  She was  asked to return to see me in the office in 2 weeks.  She has a follow-up appointment with Dr. Lindi Adie  and I told her to keep that appointment on schedule.    Pathology is pending at the time of  this dictation  Consults: None  Significant Diagnostic Studies: Surgical pathology, pending  Treatments: surgery: Insertion Port-A-Cath.  Right total mastectomy and sentinel lymph node biopsy  Disposition: Home  Patient Instructions:  Allergies as of 06/10/2018      Reactions   Aspirin Other (See Comments)   Reports GI bleed--pt is currently taking   Lisinopril Cough   Sudafed [pseudoephedrine Hcl]    Pt reports she had drainage in throat that made her throat hurt.       Medication List    TAKE these medications   acetaminophen 500 MG tablet Commonly known as:  TYLENOL Take 500-1,000 mg by mouth every 6 (six) hours as needed (FOR PAIN.).   aspirin EC 81 MG tablet Take 81 mg by mouth daily.   azelastine 0.1 % nasal spray Commonly known as:  ASTELIN Place 2 sprays into both nostrils 2 (two) times daily. Use in each nostril as directed What changed:    when to take this  reasons to take this   clonazePAM 0.5 MG tablet Commonly known as:  KLONOPIN 1 tablet at bedtime What changed:    how much to take  how to take this  when to take this   docusate sodium 100 MG capsule Commonly known as:  COLACE Take 200 mg by mouth 2 (two) times daily.   ENTRESTO 24-26 MG Generic drug:  sacubitril-valsartan TAKE (1) TABLET BY MOUTH TWICE DAILY. What changed:  See the new instructions.   folic acid 1 MG tablet Commonly known as:  FOLVITE TAKE ONE TABLET BY MOUTH DAILY.   HYDROcodone-acetaminophen 5-325 MG tablet Commonly known as:  NORCO/VICODIN Take 1-2 tablets by mouth every 6 (six) hours as needed for moderate pain.   lovastatin 40 MG tablet Commonly known as:  MEVACOR TAKE 1 TABLET BY MOUTH AT BEDTIME   metoCLOPramide 10 MG tablet Commonly known as:  REGLAN TAKE ONE  TABLET BY MOUTH TWICE DAILY.   metoprolol succinate 50 MG 24 hr tablet Commonly known as:  TOPROL-XL TAKE 1 TABLET BY MOUTH IN THE MORNING AND 1/2 TABLET IN THE EVENING. What changed:  See the new instructions.   montelukast 10 MG tablet Commonly known as:  SINGULAIR TAKE ONE TABLET BY MOUTH AT BEDTIME.   pantoprazole 40 MG tablet Commonly known as:  PROTONIX TAKE (1) TABLET BY MOUTH TWICE DAILY.   sertraline 50 MG tablet Commonly known as:  ZOLOFT TAKE 1 TABLET BY MOUTH ONCE A DAY.   spironolactone 25 MG tablet Commonly  known as:  ALDACTONE TAKE 1 TABLET BY MOUTH ONCE A DAY.   zolpidem 10 MG tablet Commonly known as:  AMBIEN TAKE 1 TABLET BY MOUTH AT BEDTIME AS NEEDED FOR SLEEP. What changed:    when to take this  additional instructions            Discharge Care Instructions  (From admission, onward)         Start     Ordered   06/10/18 0000  Discharge wound care:    Comments:  Keep a written record of the drainage and bring that record to the office with you  Move your shoulders around frequently  Change the bandage as needed.  Dry 4 x 4 gauze and ABDs  You may take a quick shower, starting in about 3 days. Replace the bandage and elastic binder after each shower   06/10/18 0710          Activity: Ambulate frequently.  No sports or heavy lifting.  No driving. Diet: low fat, low cholesterol diet Wound Care: as directed  Follow-up:  With Dr. Dalbert Batman in 2 weeks    Addendum: I logged onto the [facility] website and reviewed her prescription medication history  .  Signed: Edsel Petrin. Dalbert Batman, M.D., FACS General and minimally invasive surgery Breast and Colorectal Surgery  06/10/2018, 7:11 AM

## 2018-06-14 ENCOUNTER — Other Ambulatory Visit: Payer: Self-pay | Admitting: Family Medicine

## 2018-06-15 ENCOUNTER — Other Ambulatory Visit: Payer: Self-pay | Admitting: Family Medicine

## 2018-06-15 NOTE — Progress Notes (Signed)
Inform patient of Pathology report,. Pathology on mastectomy, as expected, showed 1.2 cm cancer. There were no lymph nodes. All margins are negative. No further surgery is necessary. Let me know you reached her. Thanks.  Dalbert Batman

## 2018-06-18 ENCOUNTER — Inpatient Hospital Stay: Payer: Medicare Other | Attending: Hematology and Oncology | Admitting: Hematology and Oncology

## 2018-06-18 ENCOUNTER — Telehealth: Payer: Self-pay | Admitting: Hematology and Oncology

## 2018-06-18 VITALS — BP 112/53 | HR 63 | Temp 98.1°F | Resp 17 | Ht <= 58 in | Wt 193.3 lb

## 2018-06-18 DIAGNOSIS — C50411 Malignant neoplasm of upper-outer quadrant of right female breast: Secondary | ICD-10-CM | POA: Insufficient documentation

## 2018-06-18 DIAGNOSIS — Z171 Estrogen receptor negative status [ER-]: Secondary | ICD-10-CM | POA: Diagnosis not present

## 2018-06-18 DIAGNOSIS — C773 Secondary and unspecified malignant neoplasm of axilla and upper limb lymph nodes: Secondary | ICD-10-CM | POA: Insufficient documentation

## 2018-06-18 DIAGNOSIS — C50811 Malignant neoplasm of overlapping sites of right female breast: Secondary | ICD-10-CM

## 2018-06-18 MED ORDER — LIDOCAINE-PRILOCAINE 2.5-2.5 % EX CREA: TOPICAL_CREAM | CUTANEOUS | 3 refills | Status: DC

## 2018-06-18 MED ORDER — ONDANSETRON HCL 8 MG PO TABS: 8.0000 mg | ORAL_TABLET | Freq: Two times a day (BID) | ORAL | 1 refills | Status: DC | PRN

## 2018-06-18 MED ORDER — PROCHLORPERAZINE MALEATE 10 MG PO TABS: 10.0000 mg | ORAL_TABLET | Freq: Four times a day (QID) | ORAL | 1 refills | Status: DC | PRN

## 2018-06-18 NOTE — Progress Notes (Signed)
START OFF PATHWAY REGIMEN - Breast   [id number]:CMF (IV cyclophosphamide) q21 days:   A cycle is every 21 days:     Cyclophosphamide      Methotrexate      5-Fluorouracil   **Always confirm dose/schedule in your pharmacy ordering system**    Patient Characteristics: Postoperative without Neoadjuvant Therapy (Pathologic Staging), Invasive Disease, Adjuvant Therapy, HER2 Negative/Unknown/Equivocal, ER Negative/Unknown, Node Negative, pT1a-c, pN0/N1mi or pT2 or Higher, pN0 Therapeutic Status: Postoperative without Neoadjuvant Therapy (Pathologic Staging) AJCC Grade: G3 AJCC N Category: pN0 AJCC M Category: cM0 ER Status: Negative (-) AJCC 8 Stage Grouping: IB HER2 Status: Negative (-) Oncotype Dx Recurrence Score: Not Appropriate AJCC T Category: pT1c PR Status: Negative (-) Intent of Therapy: Curative Intent, Discussed with Patient 

## 2018-06-18 NOTE — Telephone Encounter (Signed)
Gave pt avs and calendar  °

## 2018-06-18 NOTE — Assessment & Plan Note (Signed)
Recurrent right breast cancer:  Original cancer diagnosed 2008 treated with lumpectomy, 2/8 lymph nodes positive, triple negative, adjuvant chemo FEC Taxotere, radiation  06/09/2018 right mastectomy: Grade 3 IDC, 1.2 cm, high-grade DCIS, margins negative, negative for lymphovascular or perineural invasion, ER 0%, PR 0%, HER-2 negative, Ki-67 60%  Pathology counseling: I discussed the final pathology report of the patient provided  a copy of this report. I discussed the margins as well as lymph node surgeries. We also discussed the final staging along with previously performed ER/PR and HER-2/neu testing.  Recommendation: Adjuvant CMF chemotherapy  Return to clinic in 3 weeks to start chemo

## 2018-06-18 NOTE — Progress Notes (Signed)
Patient Care Team: [name], MD as PCP - Bethena Roys, MD as Consulting Physician (Cardiology) Cottle, Billey Co., MD as Attending Physician (Psychiatry)  DIAGNOSIS:  Encounter Diagnosis  Name Primary?  . 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  Ultrasound reveals 0.9 cm, 1.3 cm, 1 cm no abnormal lymph nodes, right breast biopsy 10 o'clock position: IDC grade 3, ER 0%, PR 0%, Ki-67 60%, HER-2 negative ratio 1.74    06/09/2018 Surgery    Right mastectomy: Grade 3 IDC, 1.2 cm, high-grade DCIS, margins negative, negative for lymphovascular or perineural invasion, ER 0%, PR 0%, HER-2 negative, Ki-67 60%     CHIEF COMPLIANT: Follow-up after right mastectomy  INTERVAL HISTORY: Tara [name] is a 72 year old with above-mentioned history of recurrent right breast cancer with negative disease who underwent right mastectomy and is here today to discuss the pathology report.  She has tolerated the surgery fairly well.  Pain appears to be under good control.  She still has drains which bother her.  REVIEW OF SYSTEMS:   Constitutional: Denies fevers, chills or abnormal weight loss Eyes: Denies blurriness of vision Ears, nose, mouth, throat, and face: Denies mucositis or sore throat Respiratory: Denies cough, dyspnea or wheezes Cardiovascular: Denies palpitation, chest  discomfort Gastrointestinal:  Denies nausea, heartburn or change in bowel habits Skin: Denies abnormal skin rashes Lymphatics: Denies new lymphadenopathy or easy bruising Neurological:Denies numbness, tingling or new weaknesses Behavioral/Psych: Mood is stable, no new changes  Extremities: No lower extremity edema All other systems were reviewed with the patient and are negative.  I have reviewed the past medical history, past surgical history, social history and family history with the patient and they are unchanged from previous note.  ALLERGIES:  is allergic to aspirin; lisinopril; and sudafed [pseudoephedrine hcl].  MEDICATIONS:  Current Outpatient Medications  Medication Sig Dispense Refill  . acetaminophen (TYLENOL) 500 MG tablet Take 500-1,000 mg by mouth every 6 (six) hours as needed (FOR PAIN.).    Marland Kitchen aspirin EC 81 MG tablet Take 81 mg by mouth daily.     Marland Kitchen azelastine (ASTELIN) 0.1 % nasal spray Place 2 sprays into both nostrils 2 (two) times daily. Use in each nostril as directed (Patient taking differently: Place 2 sprays into both nostrils 2 (two) times daily as needed (cold symptoms). Use in each nostril as directed) 30 mL 12  . clonazePAM (KLONOPIN) 0.5 MG tablet TAKE (1) TABLET BY MOUTH AT BEDTIME. 90 tablet 0  . docusate sodium (COLACE) 100 MG capsule Take 200 mg by mouth 2 (two) times daily.     Marland Kitchen ENTRESTO 24-26 MG TAKE (1) TABLET BY MOUTH TWICE DAILY. (Patient taking differently: Take 1 tablet by mouth 2 (two) times daily. ) 60 tablet 2  . folic acid (FOLVITE) 1 MG tablet TAKE ONE TABLET BY MOUTH DAILY. (Patient taking differently: Take  1 mg by mouth daily. ) 100 tablet 1  . HYDROcodone-acetaminophen (NORCO/VICODIN) 5-325 MG tablet Take 1-2 tablets by mouth every 6 (six) hours as needed for moderate pain. 20 tablet 0  . lovastatin (MEVACOR) 40 MG tablet TAKE 1 TABLET BY MOUTH AT BEDTIME 90 tablet 2  . metoCLOPramide (REGLAN) 10 MG tablet TAKE ONE TABLET BY MOUTH TWICE DAILY.  180 tablet 0  . metoprolol succinate (TOPROL-XL) 50 MG 24 hr tablet TAKE 1 TABLET BY MOUTH IN THE MORNING AND 1/2 TABLET IN THE EVENING. (Patient taking differently: Take 25-50 mg by mouth See admin instructions. TAKE 1 TABLET BY MOUTH IN THE MORNING AND 1/2 TABLET IN THE EVENING.) 45 tablet 2  . montelukast (SINGULAIR) 10 MG tablet TAKE ONE TABLET BY MOUTH AT BEDTIME. (Patient taking differently: Take 10 mg by mouth at bedtime. ) 30 tablet 3  . pantoprazole (PROTONIX) 40 MG tablet TAKE (1) TABLET BY MOUTH TWICE DAILY. 180 tablet 0  . sertraline (ZOLOFT) 50 MG tablet TAKE 1 TABLET BY MOUTH ONCE A DAY. 90 tablet 0  . spironolactone (ALDACTONE) 25 MG tablet TAKE 1 TABLET BY MOUTH ONCE A DAY. (Patient taking differently: Take 25 mg by mouth daily. ) 30 tablet 12  . zolpidem (AMBIEN) 10 MG tablet TAKE 1 TABLET BY MOUTH AT BEDTIME AS NEEDED FOR SLEEP. 30 tablet 1   No current facility-administered medications for this visit.     PHYSICAL EXAMINATION: ECOG PERFORMANCE STATUS: 1 - Symptomatic but completely ambulatory  Vitals:   06/18/18 1132  BP: (!) 112/53  Pulse: 63  Resp: 17  Temp: 98.1 F (36.7 C)  SpO2: 94%   Filed Weights   06/18/18 1132  Weight: 193 lb 4.8 oz (87.7 kg)    GENERAL:alert, no distress and comfortable SKIN: skin color, texture, turgor are normal, no rashes or significant lesions EYES: normal, Conjunctiva are pink and non-injected, sclera clear OROPHARYNX:no exudate, no erythema and lips, buccal mucosa, and tongue normal  NECK: supple, thyroid normal size, non-tender, without nodularity LYMPH:  no palpable lymphadenopathy in the cervical, axillary or inguinal LUNGS: clear to auscultation and percussion with normal breathing effort HEART: regular rate & rhythm and no murmurs and no lower extremity edema ABDOMEN:abdomen soft, non-tender and normal bowel sounds MUSCULOSKELETAL:no cyanosis of digits and no clubbing  NEURO: alert & oriented x 3 with fluent speech, no  focal motor/sensory deficits EXTREMITIES: No lower extremity edema   LABORATORY DATA:  I have reviewed the data as listed CMP Latest Ref Rng & Units 06/10/2018 06/09/2018 06/02/2018  Glucose 70 - 99 mg/dL 104(H) - 109(H)  BUN 8 - 23 mg/dL 19 - 37(H)  Creatinine 0.44 - 1.00 mg/dL 1.48(H) 1.05(H) 1.56(H)  Sodium 135 - 145 mmol/L 140 - 140  Potassium 3.5 - 5.1 mmol/L 3.9 - 4.9  Chloride 98 - 111 mmol/L 111 - 112(H)  CO2 22 - 32 mmol/L 23 - 19(L)  Calcium 8.9 - 10.3 mg/dL 8.0(L) - 8.7(L)  Total Protein 6.5 - 8.1 g/dL - - 7.1  Total Bilirubin 0.3 - 1.2 mg/dL - - 0.3  Alkaline Phos 38 - 126 U/L - - 94  AST 15 - 41 U/L - - 14(L)  ALT 0 - 44 U/L - - 12    Lab Results  Component Value Date   WBC 10.0 06/10/2018   HGB 9.0 (L) 06/10/2018   HCT 29.6 (L) 06/10/2018   MCV 85.5 06/10/2018   PLT 223 06/10/2018   NEUTROABS 7.0 06/02/2018    ASSESSMENT &  PLAN:  Malignant neoplasm of right breast ([place]) Recurrent right breast cancer:  Original cancer diagnosed 2008 treated with lumpectomy, 2/8 lymph nodes positive, triple negative, adjuvant chemo FEC Taxotere, radiation  06/09/2018 right mastectomy: Grade 3 IDC, 1.2 cm, high-grade DCIS, margins negative, negative for lymphovascular or perineural invasion, ER 0%, PR 0%, HER-2 negative, Ki-67 60%  Pathology counseling: I discussed the final pathology report of the patient provided  a copy of this report. I discussed the margins as well as lymph node surgeries. We also discussed the final staging along with previously performed ER/PR and HER-2/neu testing.  Recommendation: Adjuvant CMF chemotherapy  Return to clinic in 3 weeks to start chemo on 07/16/2018   No orders of the defined types were placed in this encounter.  The patient has a good understanding of the overall plan. she agrees with it. she will call with any problems that may develop before the next visit here.   Harriette Ohara, MD 06/18/18

## 2018-06-22 ENCOUNTER — Other Ambulatory Visit (HOSPITAL_COMMUNITY): Payer: Self-pay | Admitting: Cardiology

## 2018-06-22 ENCOUNTER — Other Ambulatory Visit: Payer: Self-pay | Admitting: Internal Medicine

## 2018-06-25 ENCOUNTER — Inpatient Hospital Stay: Payer: Medicare Other | Attending: Hematology and Oncology

## 2018-06-25 DIAGNOSIS — Z5111 Encounter for antineoplastic chemotherapy: Secondary | ICD-10-CM | POA: Insufficient documentation

## 2018-06-25 DIAGNOSIS — Z171 Estrogen receptor negative status [ER-]: Secondary | ICD-10-CM | POA: Insufficient documentation

## 2018-06-25 DIAGNOSIS — C50811 Malignant neoplasm of overlapping sites of right female breast: Secondary | ICD-10-CM | POA: Insufficient documentation

## 2018-06-25 DIAGNOSIS — Z452 Encounter for adjustment and management of vascular access device: Secondary | ICD-10-CM | POA: Insufficient documentation

## 2018-06-29 DIAGNOSIS — C50911 Malignant neoplasm of unspecified site of right female breast: Secondary | ICD-10-CM | POA: Diagnosis not present

## 2018-07-08 DIAGNOSIS — C50911 Malignant neoplasm of unspecified site of right female breast: Secondary | ICD-10-CM | POA: Diagnosis not present

## 2018-07-16 ENCOUNTER — Inpatient Hospital Stay: Payer: Medicare Other

## 2018-07-16 ENCOUNTER — Inpatient Hospital Stay (HOSPITAL_BASED_OUTPATIENT_CLINIC_OR_DEPARTMENT_OTHER): Payer: Medicare Other | Admitting: Medical

## 2018-07-16 ENCOUNTER — Inpatient Hospital Stay (HOSPITAL_BASED_OUTPATIENT_CLINIC_OR_DEPARTMENT_OTHER): Payer: Medicare Other | Admitting: Hematology and Oncology

## 2018-07-16 VITALS — BP 92/55 | HR 61 | Temp 97.5°F | Resp 18

## 2018-07-16 DIAGNOSIS — Z171 Estrogen receptor negative status [ER-]: Secondary | ICD-10-CM | POA: Diagnosis not present

## 2018-07-16 DIAGNOSIS — R21 Rash and other nonspecific skin eruption: Secondary | ICD-10-CM

## 2018-07-16 DIAGNOSIS — C50811 Malignant neoplasm of overlapping sites of right female breast: Secondary | ICD-10-CM

## 2018-07-16 DIAGNOSIS — Z5111 Encounter for antineoplastic chemotherapy: Secondary | ICD-10-CM | POA: Diagnosis present

## 2018-07-16 DIAGNOSIS — Z95828 Presence of other vascular implants and grafts: Secondary | ICD-10-CM

## 2018-07-16 DIAGNOSIS — Z452 Encounter for adjustment and management of vascular access device: Secondary | ICD-10-CM | POA: Diagnosis not present

## 2018-07-16 DIAGNOSIS — T8090XA Unspecified complication following infusion and therapeutic injection, initial encounter: Secondary | ICD-10-CM

## 2018-07-16 LAB — CBC WITH DIFFERENTIAL (CANCER CENTER ONLY)
ABS IMMATURE GRANULOCYTES: 0.04 10*3/uL (ref 0.00–0.07)
Basophils Absolute: 0 10*3/uL (ref 0.0–0.1)
Basophils Relative: 0 %
Eosinophils Absolute: 0.4 10*3/uL (ref 0.0–0.5)
Eosinophils Relative: 3 %
HCT: 32.9 % — ABNORMAL LOW (ref 36.0–46.0)
HEMOGLOBIN: 10.3 g/dL — AB (ref 12.0–15.0)
Immature Granulocytes: 0 %
Lymphocytes Relative: 19 %
Lymphs Abs: 2.2 10*3/uL (ref 0.7–4.0)
MCH: 26.3 pg (ref 26.0–34.0)
MCHC: 31.3 g/dL (ref 30.0–36.0)
MCV: 83.9 fL (ref 80.0–100.0)
MONO ABS: 0.7 10*3/uL (ref 0.1–1.0)
Monocytes Relative: 6 %
NEUTROS ABS: 8.4 10*3/uL — AB (ref 1.7–7.7)
Neutrophils Relative %: 72 %
Platelet Count: 268 10*3/uL (ref 150–400)
RBC: 3.92 MIL/uL (ref 3.87–5.11)
RDW: 15.5 % (ref 11.5–15.5)
WBC: 11.6 10*3/uL — AB (ref 4.0–10.5)
nRBC: 0 % (ref 0.0–0.2)

## 2018-07-16 LAB — CMP (CANCER CENTER ONLY)
ALT: 10 U/L (ref 0–44)
AST: 14 U/L — AB (ref 15–41)
Albumin: 3.3 g/dL — ABNORMAL LOW (ref 3.5–5.0)
Alkaline Phosphatase: 105 U/L (ref 38–126)
Anion gap: 11 (ref 5–15)
BUN: 37 mg/dL — AB (ref 8–23)
CHLORIDE: 109 mmol/L (ref 98–111)
CO2: 21 mmol/L — AB (ref 22–32)
Calcium: 9 mg/dL (ref 8.9–10.3)
Creatinine: 1.39 mg/dL — ABNORMAL HIGH (ref 0.44–1.00)
GFR, EST NON AFRICAN AMERICAN: 37 mL/min — AB (ref >60)
GFR, Est AFR Am: 43 mL/min — ABNORMAL LOW (ref >60)
Glucose, Bld: 103 mg/dL — ABNORMAL HIGH (ref 70–99)
POTASSIUM: 4.3 mmol/L (ref 3.5–5.1)
Sodium: 141 mmol/L (ref 135–145)
Total Bilirubin: 0.3 mg/dL (ref 0.3–1.2)
Total Protein: 7.3 g/dL (ref 6.5–8.1)

## 2018-07-16 MED ORDER — SODIUM CHLORIDE 0.9 % IV SOLN
Freq: Once | INTRAVENOUS | Status: AC
  Administered 2018-07-16: 11:00:00 via INTRAVENOUS
  Filled 2018-07-16: qty 250

## 2018-07-16 MED ORDER — HEPARIN SOD (PORK) LOCK FLUSH 100 UNIT/ML IV SOLN
500.0000 [IU] | Freq: Once | INTRAVENOUS | Status: AC | PRN
  Administered 2018-07-16: 500 [IU]
  Filled 2018-07-16: qty 5

## 2018-07-16 MED ORDER — SODIUM CHLORIDE 0.9% FLUSH
10.0000 mL | INTRAVENOUS | Status: DC | PRN
  Administered 2018-07-16: 10 mL
  Filled 2018-07-16: qty 10

## 2018-07-16 MED ORDER — PALONOSETRON HCL INJECTION 0.25 MG/5ML
INTRAVENOUS | Status: AC
  Filled 2018-07-16: qty 5

## 2018-07-16 MED ORDER — SODIUM CHLORIDE 0.9% FLUSH
10.0000 mL | INTRAVENOUS | Status: DC | PRN
  Administered 2018-07-16: 10 mL via INTRAVENOUS
  Filled 2018-07-16: qty 10

## 2018-07-16 MED ORDER — METHOTREXATE SODIUM (PF) CHEMO INJECTION 250 MG/10ML
75.0000 mg | Freq: Once | INTRAMUSCULAR | Status: AC
  Administered 2018-07-16: 75 mg via INTRAVENOUS
  Filled 2018-07-16: qty 3

## 2018-07-16 MED ORDER — DIPHENHYDRAMINE HCL 50 MG/ML IJ SOLN
50.0000 mg | Freq: Once | INTRAMUSCULAR | Status: AC
  Administered 2018-07-16: 50 mg via INTRAVENOUS

## 2018-07-16 MED ORDER — FLUOROURACIL CHEMO INJECTION 2.5 GM/50ML
600.0000 mg/m2 | Freq: Once | INTRAVENOUS | Status: AC
  Administered 2018-07-16: 1150 mg via INTRAVENOUS
  Filled 2018-07-16: qty 23

## 2018-07-16 MED ORDER — SODIUM CHLORIDE 0.9 % IV SOLN
600.0000 mg/m2 | Freq: Once | INTRAVENOUS | Status: AC
  Administered 2018-07-16: 1120 mg via INTRAVENOUS
  Filled 2018-07-16: qty 56

## 2018-07-16 MED ORDER — PALONOSETRON HCL INJECTION 0.25 MG/5ML
0.2500 mg | Freq: Once | INTRAVENOUS | Status: AC
  Administered 2018-07-16: 0.25 mg via INTRAVENOUS

## 2018-07-16 NOTE — Patient Instructions (Signed)
[facility] Discharge Instructions for Patients Receiving Chemotherapy  Today you received the following chemotherapy agents: Cytoxan, Methotrexate, & 5FU.  To help prevent nausea and vomiting after your treatment, we encourage you to take your nausea medication as prescribed. If you develop nausea and vomiting that is not controlled by your nausea medication, call the clinic.   BELOW ARE SYMPTOMS THAT SHOULD BE REPORTED IMMEDIATELY:  *FEVER GREATER THAN 100.5 F  *CHILLS WITH OR WITHOUT FEVER  NAUSEA AND VOMITING THAT IS NOT CONTROLLED WITH YOUR NAUSEA MEDICATION  *UNUSUAL SHORTNESS OF BREATH  *UNUSUAL BRUISING OR BLEEDING  TENDERNESS IN MOUTH AND THROAT WITH OR WITHOUT PRESENCE OF ULCERS  *URINARY PROBLEMS  *BOWEL PROBLEMS  UNUSUAL RASH Items with * indicate a potential emergency and should be followed up as soon as possible.  Feel free to call the clinic should you have any questions or concerns. The clinic phone number is [phone].  Please show the CHEMO ALERT CARD at check-in to the Emergency Department and triage nurse.   Cyclophosphamide injection What is this medicine? CYCLOPHOSPHAMIDE (sye kloe FOSS fa mide) is a chemotherapy drug. It slows the growth of cancer cells. This medicine is used to treat many types of cancer like lymphoma, myeloma, leukemia, breast cancer, and ovarian cancer, to name a few. This medicine may be used for other purposes; ask your health care provider or pharmacist if you have questions. COMMON BRAND NAME(S): Cytoxan, Neosar What should I tell my health care provider before I take this medicine? They need to know if you have any of these conditions: -blood disorders -history of other chemotherapy -infection -kidney disease -liver disease -recent or ongoing radiation therapy -tumors in the bone marrow -an unusual or allergic reaction to cyclophosphamide, other chemotherapy, other medicines, foods, dyes, or  preservatives -pregnant or trying to get pregnant -breast-feeding How should I use this medicine? This drug is usually given as an injection into a vein or muscle or by infusion into a vein. It is administered in a hospital or clinic by a specially trained health care professional. Talk to your pediatrician regarding the use of this medicine in children. Special care may be needed. Overdosage: If you think you have taken too much of this medicine contact a poison control center or emergency room at once. NOTE: This medicine is only for you. Do not share this medicine with others. What if I miss a dose? It is important not to miss your dose. Call your doctor or health care professional if you are unable to keep an appointment. What may interact with this medicine? This medicine may interact with the following medications: -amiodarone -amphotericin B -azathioprine -certain antiviral medicines for HIV or AIDS such as protease inhibitors (e.g., indinavir, ritonavir) and zidovudine -certain blood pressure medications such as benazepril, captopril, enalapril, fosinopril, lisinopril, moexipril, monopril, perindopril, quinapril, ramipril, trandolapril -certain cancer medications such as anthracyclines (e.g., daunorubicin, doxorubicin), busulfan, cytarabine, paclitaxel, pentostatin, tamoxifen, trastuzumab -certain diuretics such as chlorothiazide, chlorthalidone, hydrochlorothiazide, indapamide, metolazone -certain medicines that treat or prevent blood clots like warfarin -certain muscle relaxants such as succinylcholine -cyclosporine -etanercept -indomethacin -medicines to increase blood counts like filgrastim, pegfilgrastim, sargramostim -medicines used as general anesthesia -metronidazole -natalizumab This list may not describe all possible interactions. Give your health care provider a list of all the medicines, herbs, non-prescription drugs, or dietary supplements you use. Also tell them if  you smoke, drink alcohol, or use illegal drugs. Some items may interact with your medicine. What should I watch   for while using this medicine? Visit your doctor for checks on your progress. This drug may make you feel generally unwell. This is not uncommon, as chemotherapy can affect healthy cells as well as cancer cells. Report any side effects. Continue your course of treatment even though you feel ill unless your doctor tells you to stop. Drink water or other fluids as directed. Urinate often, even at night. In some cases, you may be given additional medicines to help with side effects. Follow all directions for their use. Call your doctor or health care professional for advice if you get a fever, chills or sore throat, or other symptoms of a cold or flu. Do not treat yourself. This drug decreases your body's ability to fight infections. Try to avoid being around people who are sick. This medicine may increase your risk to bruise or bleed. Call your doctor or health care professional if you notice any unusual bleeding. Be careful brushing and flossing your teeth or using a toothpick because you may get an infection or bleed more easily. If you have any dental work done, tell your dentist you are receiving this medicine. You may get drowsy or dizzy. Do not drive, use machinery, or do anything that needs mental alertness until you know how this medicine affects you. Do not become pregnant while taking this medicine or for 1 year after stopping it. Women should inform their doctor if they wish to become pregnant or think they might be pregnant. Men should not father a child while taking this medicine and for 4 months after stopping it. There is a potential for serious side effects to an unborn child. Talk to your health care professional or pharmacist for more information. Do not breast-feed an infant while taking this medicine. This medicine may interfere with the ability to have a child. This medicine  has caused ovarian failure in some women. This medicine has caused reduced sperm counts in some men. You should talk with your doctor or health care professional if you are concerned about your fertility. If you are going to have surgery, tell your doctor or health care professional that you have taken this medicine. What side effects may I notice from receiving this medicine? Side effects that you should report to your doctor or health care professional as soon as possible: -allergic reactions like skin rash, itching or hives, swelling of the face, lips, or tongue -low blood counts - this medicine may decrease the number of white blood cells, red blood cells and platelets. You may be at increased risk for infections and bleeding. -signs of infection - fever or chills, cough, sore throat, pain or difficulty passing urine -signs of decreased platelets or bleeding - bruising, pinpoint red spots on the skin, black, tarry stools, blood in the urine -signs of decreased red blood cells - unusually weak or tired, fainting spells, lightheadedness -breathing problems -dark urine -dizziness -palpitations -swelling of the ankles, feet, hands -trouble passing urine or change in the amount of urine -weight gain -yellowing of the eyes or skin Side effects that usually do not require medical attention (report to your doctor or health care professional if they continue or are bothersome): -changes in nail or skin color -hair loss -missed menstrual periods -mouth sores -nausea, vomiting This list may not describe all possible side effects. Call your doctor for medical advice about side effects. You may report side effects to FDA at 1-800-FDA-1088. Where should I keep my medicine? This drug is given in a hospital  or clinic and will not be stored at home. NOTE: This sheet is a summary. It may not cover all possible information. If you have questions about this medicine, talk to your doctor, pharmacist, or  health care provider.  2018 [employer]vier/[employer] Standard (2012-07-23 16:22:58)   Methotrexate injection What is this medicine? METHOTREXATE (METH oh TREX ate) is a chemotherapy drug used to treat cancer including breast cancer, leukemia, and lymphoma. This medicine can also be used to treat psoriasis and certain kinds of arthritis. This medicine may be used for other purposes; ask your health care provider or pharmacist if you have questions. What should I tell my health care provider before I take this medicine? They need to know if you have any of these conditions: -fluid in the stomach area or lungs -if you often drink alcohol -infection or immune system problems -kidney disease -liver disease -low blood counts, like low white cell, platelet, or red cell counts -lung disease -radiation therapy -stomach ulcers -ulcerative colitis -an unusual or allergic reaction to methotrexate, other medicines, foods, dyes, or preservatives -pregnant or trying to get pregnant -breast-feeding How should I use this medicine? This medicine is for infusion into a vein or for injection into muscle or into the spinal fluid (whichever applies). It is usually given by a health care professional in a hospital or clinic setting. In rare cases, you might get this medicine at home. You will be taught how to give this medicine. Use exactly as directed. Take your medicine at regular intervals. Do not take your medicine more often than directed. If this medicine is used for arthritis or psoriasis, it should be taken weekly, NOT daily. It is important that you put your used needles and syringes in a special sharps container. Do not put them in a trash can. If you do not have a sharps container, call your pharmacist or healthcare provider to get one. Talk to your pediatrician regarding the use of this medicine in children. While this drug may be prescribed for children as young as 2 years for selected conditions,  precautions do apply. Overdosage: If you think you have taken too much of this medicine contact a poison control center or emergency room at once. NOTE: This medicine is only for you. Do not share this medicine with others. What if I miss a dose? It is important not to miss your dose. Call your doctor or health care professional if you are unable to keep an appointment. If you give yourself the medicine and you miss a dose, talk with your doctor or health care professional. Do not take double or extra doses. What may interact with this medicine? This medicine may interact with the following medications: -acitretin -aspirin or aspirin-like medicines including salicylates -azathioprine -certain antibiotics like chloramphenicol, penicillin, tetracycline -certain medicines for stomach problems like esomeprazole, omeprazole, pantoprazole -cyclosporine -gold -hydroxychloroquine -live virus vaccines -mercaptopurine -NSAIDs, medicines for pain and inflammation, like ibuprofen or naproxen -other cytotoxic agents -penicillamine -phenylbutazone -phenytoin -probenacid -retinoids such as isotretinoin and tretinoin -steroid medicines like prednisone or cortisone -sulfonamides like sulfasalazine and trimethoprim/sulfamethoxazole -theophylline This list may not describe all possible interactions. Give your health care provider a list of all the medicines, herbs, non-prescription drugs, or dietary supplements you use. Also tell them if you smoke, drink alcohol, or use illegal drugs. Some items may interact with your medicine. What should I watch for while using this medicine? Avoid alcoholic drinks. In some cases, you may be given additional medicines to help with side effects.   Follow all directions for their use. This medicine can make you more sensitive to the sun. Keep out of the sun. If you cannot avoid being in the sun, wear protective clothing and use sunscreen. Do not use sun lamps or tanning  beds/booths. You may get drowsy or dizzy. Do not drive, use machinery, or do anything that needs mental alertness until you know how this medicine affects you. Do not stand or sit up quickly, especially if you are an older patient. This reduces the risk of dizzy or fainting spells. You may need blood work done while you are taking this medicine. Call your doctor or health care professional for advice if you get a fever, chills or sore throat, or other symptoms of a cold or flu. Do not treat yourself. This drug decreases your body's ability to fight infections. Try to avoid being around people who are sick. This medicine may increase your risk to bruise or bleed. Call your doctor or health care professional if you notice any unusual bleeding. Check with your doctor or health care professional if you get an attack of severe diarrhea, nausea and vomiting, or if you sweat a lot. The loss of too much body fluid can make it dangerous for you to take this medicine. Talk to your doctor about your risk of cancer. You may be more at risk for certain types of cancers if you take this medicine. Both men and women must use effective birth control with this medicine. Do not become pregnant while taking this medicine or until at least 1 normal menstrual cycle has occurred after stopping it. Women should inform their doctor if they wish to become pregnant or think they might be pregnant. Men should not father a child while taking this medicine and for 3 months after stopping it. There is a potential for serious side effects to an unborn child. Talk to your health care professional or pharmacist for more information. Do not breast-feed an infant while taking this medicine. What side effects may I notice from receiving this medicine? Side effects that you should report to your doctor or health care professional as soon as possible: -allergic reactions like skin rash, itching or hives, swelling of the face, lips, or  tongue -back pain -breathing problems or shortness of breath -confusion -diarrhea -dry, nonproductive cough -low blood counts - this medicine may decrease the number of white blood cells, red blood cells and platelets. You may be at increased risk of infections and bleeding -mouth sores -redness, blistering, peeling or loosening of the skin, including inside the mouth -seizures -severe headaches -signs of infection - fever or chills, cough, sore throat, pain or difficulty passing urine -signs and symptoms of bleeding such as bloody or black, tarry stools; red or dark-brown urine; spitting up blood or brown material that looks like coffee grounds; red spots on the skin; unusual bruising or bleeding from the eye, gums, or nose -signs and symptoms of kidney injury like trouble passing urine or change in the amount of urine -signs and symptoms of liver injury like dark yellow or brown urine; general ill feeling or flu-like symptoms; light-colored stools; loss of appetite; nausea; right upper belly pain; unusually weak or tired; yellowing of the eyes or skin -stiff neck -vomiting Side effects that usually do not require medical attention (report to your doctor or health care professional if they continue or are bothersome): -dizziness -hair loss -headache -stomach pain -upset stomach This list may not describe all possible side effects. Call   your doctor for medical advice about side effects. You may report side effects to FDA at 1-800-FDA-1088. Where should I keep my medicine? If you are using this medicine at home, you will be instructed on how to store this medicine. Throw away any unused medicine after the expiration date on the label. NOTE: This sheet is a summary. It may not cover all possible information. If you have questions about this medicine, talk to your doctor, pharmacist, or health care provider.  2018 [employer]/Gold Standard (2014-12-28 12:36:41)  Fluorouracil, 5-FU  injection What is this medicine? FLUOROURACIL, 5-FU (flure oh YOOR a sil) is a chemotherapy drug. It slows the growth of cancer cells. This medicine is used to treat many types of cancer like breast cancer, colon or rectal cancer, pancreatic cancer, and stomach cancer. This medicine may be used for other purposes; ask your health care provider or pharmacist if you have questions. COMMON BRAND NAME(S): Adrucil What should I tell my health care provider before I take this medicine? They need to know if you have any of these conditions: -blood disorders -dihydropyrimidine dehydrogenase (DPD) deficiency -infection (especially a virus infection such as chickenpox, cold sores, or herpes) -kidney disease -liver disease -malnourished, poor nutrition -recent or ongoing radiation therapy -an unusual or allergic reaction to fluorouracil, other chemotherapy, other medicines, foods, dyes, or preservatives -pregnant or trying to get pregnant -breast-feeding How should I use this medicine? This drug is given as an infusion or injection into a vein. It is administered in a hospital or clinic by a specially trained health care professional. Talk to your pediatrician regarding the use of this medicine in children. Special care may be needed. Overdosage: If you think you have taken too much of this medicine contact a poison control center or emergency room at once. NOTE: This medicine is only for you. Do not share this medicine with others. What if I miss a dose? It is important not to miss your dose. Call your doctor or health care professional if you are unable to keep an appointment. What may interact with this medicine? -allopurinol -cimetidine -dapsone -digoxin -hydroxyurea -leucovorin -levamisole -medicines for seizures like ethotoin, fosphenytoin, phenytoin -medicines to increase blood counts like filgrastim, pegfilgrastim, sargramostim -medicines that treat or prevent blood clots like  warfarin, enoxaparin, and dalteparin -methotrexate -metronidazole -pyrimethamine -some other chemotherapy drugs like busulfan, cisplatin, estramustine, vinblastine -trimethoprim -trimetrexate -vaccines Talk to your doctor or health care professional before taking any of these medicines: -acetaminophen -aspirin -ibuprofen -ketoprofen -naproxen This list may not describe all possible interactions. Give your health care provider a list of all the medicines, herbs, non-prescription drugs, or dietary supplements you use. Also tell them if you smoke, drink alcohol, or use illegal drugs. Some items may interact with your medicine. What should I watch for while using this medicine? Visit your doctor for checks on your progress. This drug may make you feel generally unwell. This is not uncommon, as chemotherapy can affect healthy cells as well as cancer cells. Report any side effects. Continue your course of treatment even though you feel ill unless your doctor tells you to stop. In some cases, you may be given additional medicines to help with side effects. Follow all directions for their use. Call your doctor or health care professional for advice if you get a fever, chills or sore throat, or other symptoms of a cold or flu. Do not treat yourself. This drug decreases your body's ability to fight infections. Try to avoid being around people who   are sick. This medicine may increase your risk to bruise or bleed. Call your doctor or health care professional if you notice any unusual bleeding. Be careful brushing and flossing your teeth or using a toothpick because you may get an infection or bleed more easily. If you have any dental work done, tell your dentist you are receiving this medicine. Avoid taking products that contain aspirin, acetaminophen, ibuprofen, naproxen, or ketoprofen unless instructed by your doctor. These medicines may hide a fever. Do not become pregnant while taking this medicine.  Women should inform their doctor if they wish to become pregnant or think they might be pregnant. There is a potential for serious side effects to an unborn child. Talk to your health care professional or pharmacist for more information. Do not breast-feed an infant while taking this medicine. Men should inform their doctor if they wish to father a child. This medicine may lower sperm counts. Do not treat diarrhea with over the counter products. Contact your doctor if you have diarrhea that lasts more than 2 days or if it is severe and watery. This medicine can make you more sensitive to the sun. Keep out of the sun. If you cannot avoid being in the sun, wear protective clothing and use sunscreen. Do not use sun lamps or tanning beds/booths. What side effects may I notice from receiving this medicine? Side effects that you should report to your doctor or health care professional as soon as possible: -allergic reactions like skin rash, itching or hives, swelling of the face, lips, or tongue -low blood counts - this medicine may decrease the number of white blood cells, red blood cells and platelets. You may be at increased risk for infections and bleeding. -signs of infection - fever or chills, cough, sore throat, pain or difficulty passing urine -signs of decreased platelets or bleeding - bruising, pinpoint red spots on the skin, black, tarry stools, blood in the urine -signs of decreased red blood cells - unusually weak or tired, fainting spells, lightheadedness -breathing problems -changes in vision -chest pain -mouth sores -nausea and vomiting -pain, swelling, redness at site where injected -pain, tingling, numbness in the hands or feet -redness, swelling, or sores on hands or feet -stomach pain -unusual bleeding Side effects that usually do not require medical attention (report to your doctor or health care professional if they continue or are bothersome): -changes in finger or toe  nails -diarrhea -dry or itchy skin -hair loss -headache -loss of appetite -sensitivity of eyes to the light -stomach upset -unusually teary eyes This list may not describe all possible side effects. Call your doctor for medical advice about side effects. You may report side effects to FDA at 1-800-FDA-1088. Where should I keep my medicine? This drug is given in a hospital or clinic and will not be stored at home. NOTE: This sheet is a summary. It may not cover all possible information. If you have questions about this medicine, talk to your doctor, pharmacist, or health care provider.  2018 [employer]vier/Gold Standard (2008-01-12 13:53:16)   

## 2018-07-16 NOTE — Progress Notes (Signed)
 Patient Care Team: Simpson, Margaret E, MD as PCP - General Allred, James, MD as Consulting Physician (Cardiology) Cottle, Carey G Jr., MD as Attending Physician (Psychiatry)  DIAGNOSIS:  Encounter Diagnosis  Name Primary?  . 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  She appears to be relaxed and denies any anxiety.  She feels all of her medications.  REVIEW OF SYSTEMS:   Constitutional: Denies fevers, chills or abnormal weight loss Eyes: Denies blurriness of vision Ears, nose, mouth, throat, and face: Denies mucositis or sore throat Respiratory: Denies cough, dyspnea or  wheezes Cardiovascular: Denies palpitation, chest discomfort Gastrointestinal:  Denies nausea, heartburn or change in bowel habits Skin: Denies abnormal skin rashes Lymphatics: Denies new lymphadenopathy or easy bruising Neurological:Denies numbness, tingling or new weaknesses Behavioral/Psych: Mood is stable, no new changes  Extremities: No lower extremity edema  All other systems were reviewed with the patient and are negative.  I have reviewed the past medical history, past surgical history, social history and family history with the patient and they are unchanged from previous note.  ALLERGIES:  is allergic to aspirin; lisinopril; and sudafed [pseudoephedrine hcl].  MEDICATIONS:  Current Outpatient Medications  Medication Sig Dispense Refill  . acetaminophen (TYLENOL) 500 MG tablet Take 500-1,000 mg by mouth every 6 (six) hours as needed (FOR PAIN.).    . aspirin EC 81 MG tablet Take 81 mg by mouth daily.     . azelastine (ASTELIN) 0.1 % nasal spray Place 2 sprays into both nostrils 2 (two) times daily. Use in each nostril as directed (Patient taking differently: Place 2 sprays into both nostrils 2 (two) times daily as needed (cold symptoms). Use in each nostril as directed) 30 mL 12  . clonazePAM (KLONOPIN) 0.5 MG tablet TAKE (1) TABLET BY MOUTH AT BEDTIME. 90 tablet 0  . docusate sodium (COLACE) 100 MG capsule Take 200 mg by mouth 2 (two) times daily.     . folic acid (FOLVITE) 1 MG tablet TAKE ONE TABLET BY MOUTH DAILY. (Patient taking differently: Take 1 mg by mouth daily. ) 100 tablet   1  . HYDROcodone-acetaminophen (NORCO/VICODIN) 5-325 MG tablet Take 1-2 tablets by mouth every 6 (six) hours as needed for moderate pain. 20 tablet 0  . lidocaine-prilocaine (EMLA) cream Apply to affected area once 30 g 3  . lovastatin (MEVACOR) 40 MG tablet TAKE 1 TABLET BY MOUTH AT BEDTIME 90 tablet 2  . metoCLOPramide (REGLAN) 10 MG tablet TAKE ONE TABLET BY MOUTH TWICE DAILY. 180 tablet 0  .  metoprolol succinate (TOPROL-XL) 50 MG 24 hr tablet Take 1 tablet (50 mg total) by mouth See admin instructions. TAKE 1 TABLET BY MOUTH IN THE MORNING AND 1/2 TABLET IN THE EVENING. 30 tablet 1  . montelukast (SINGULAIR) 10 MG tablet TAKE ONE TABLET BY MOUTH AT BEDTIME. (Patient taking differently: Take 10 mg by mouth at bedtime. ) 30 tablet 3  . ondansetron (ZOFRAN) 8 MG tablet Take 1 tablet (8 mg total) by mouth 2 (two) times daily as needed for refractory nausea / vomiting. Start on day 3 after chemotherapy. 30 tablet 1  . pantoprazole (PROTONIX) 40 MG tablet TAKE (1) TABLET BY MOUTH TWICE DAILY. 180 tablet 0  . prochlorperazine (COMPAZINE) 10 MG tablet Take 1 tablet (10 mg total) by mouth every 6 (six) hours as needed (Nausea or vomiting). 30 tablet 1  . sacubitril-valsartan (ENTRESTO) 24-26 MG Take 1 tablet by mouth 2 (two) times daily. 30 tablet 2  . sertraline (ZOLOFT) 50 MG tablet TAKE 1 TABLET BY MOUTH ONCE A DAY. 90 tablet 0  . spironolactone (ALDACTONE) 25 MG tablet TAKE 1 TABLET BY MOUTH ONCE A DAY. (Patient taking differently: Take 25 mg by mouth daily. ) 30 tablet 12  . zolpidem (AMBIEN) 10 MG tablet TAKE 1 TABLET BY MOUTH AT BEDTIME AS NEEDED FOR SLEEP. 30 tablet 1   No current facility-administered medications for this visit.    Facility-Administered Medications Ordered in Other Visits  Medication Dose Route Frequency Provider Last Rate Last Dose  . cyclophosphamide (CYTOXAN) 1,120 mg in sodium chloride 0.9 % 250 mL chemo infusion  600 mg/m2 (Treatment Plan Recorded) Intravenous Once Nicholas Lose, MD      . fluorouracil (ADRUCIL) chemo injection 1,150 mg  600 mg/m2 (Treatment Plan Recorded) Intravenous Once Nicholas Lose, MD      . heparin lock flush 100 unit/mL  500 Units Intracatheter Once PRN Nicholas Lose, MD      . methotrexate (PF) chemo injection 75 mg  75 mg Intravenous Once Nicholas Lose, MD      . sodium chloride flush (NS) 0.9 % injection 10 mL  10 mL Intracatheter PRN  Nicholas Lose, MD        PHYSICAL EXAMINATION: ECOG PERFORMANCE STATUS: 1 - Symptomatic but completely ambulatory  Vitals:   07/16/18 1004  BP: 108/61  Pulse: 64  Resp: 16  Temp: 97.9 F (36.6 C)  SpO2: 96%   Filed Weights   07/16/18 1004  Weight: 191 lb 12.8 oz (87 kg)    GENERAL:alert, no distress and comfortable SKIN: skin color, texture, turgor are normal, no rashes or significant lesions EYES: normal, Conjunctiva are pink and non-injected, sclera clear OROPHARYNX:no exudate, no erythema and lips, buccal mucosa, and tongue normal  NECK: supple, thyroid normal size, non-tender, without nodularity LYMPH:  no palpable lymphadenopathy in the cervical, axillary or inguinal LUNGS: clear to auscultation and percussion with normal breathing effort HEART: regular rate & rhythm and no murmurs and no lower extremity edema ABDOMEN:abdomen soft, non-tender and normal bowel sounds MUSCULOSKELETAL:no cyanosis of digits and no clubbing  NEURO: alert & oriented x 3 with fluent speech, no focal motor/sensory deficits EXTREMITIES: No lower extremity edema  LABORATORY DATA:  I have reviewed the data as listed CMP Latest Ref Rng & Units 07/16/2018 06/10/2018 06/09/2018  Glucose 70 - 99 mg/dL 103(H) 104(H) -  BUN 8 - 23 mg/dL 37(H) 19 -  Creatinine 0.44 - 1.00 mg/dL 1.39(H) 1.48(H) 1.05(H)  Sodium 135 - 145 mmol/L 141 140 -  Potassium 3.5 - 5.1 mmol/L 4.3 3.9 -  Chloride 98 - 111 mmol/L 109 111 -  CO2 22 - 32 mmol/L 21(L) 23 -  Calcium 8.9 - 10.3 mg/dL 9.0 8.0(L) -  Total Protein 6.5 - 8.1 g/dL 7.3 - -  Total Bilirubin 0.3 - 1.2 mg/dL 0.3 - -  Alkaline Phos 38 - 126 U/L 105 - -  AST 15 - 41 U/L 14(L) - -  ALT 0 - 44 U/L 10 - -    Lab Results  Component Value Date   WBC 11.6 (H) 07/16/2018   HGB 10.3 (L) 07/16/2018   HCT 32.9 (L) 07/16/2018   MCV 83.9 07/16/2018   PLT 268 07/16/2018   NEUTROABS 8.4 (H) 07/16/2018    ASSESSMENT & PLAN:  Malignant neoplasm of midline of right  female breast ([place]) Recurrent right breast cancer:  Original cancer diagnosed 2008 treated with lumpectomy, 2/8 lymph nodes positive, triple negative, adjuvant chemo FEC Taxotere, radiation  06/09/2018 right mastectomy: Grade 3 IDC, 1.2 cm, high-grade DCIS, margins negative, negative for lymphovascular or perineural invasion, ER 0%, PR 0%, HER-2 negative, Ki-67 60%  Current treatment: Adjuvant CMF chemotherapy starting 07/16/2018  Labs reviewed Chemo education completed Antiemetics were reviewed Return to clinic in 1 week for toxicity check  No orders of the defined types were placed in this encounter.  The patient has a good understanding of the overall plan. she agrees with it. she will call with any problems that may develop before the next visit here.   Harriette Ohara, MD 07/16/18

## 2018-07-16 NOTE — Assessment & Plan Note (Signed)
Recurrent right breast cancer:  Original cancer diagnosed 2008 treated with lumpectomy, 2/8 lymph nodes positive, triple negative, adjuvant chemo FEC Taxotere, radiation  06/09/2018 right mastectomy: Grade 3 IDC, 1.2 cm, high-grade DCIS, margins negative, negative for lymphovascular or perineural invasion, ER 0%, PR 0%, HER-2 negative, Ki-67 60%  Current treatment: Adjuvant CMF chemotherapy starting 07/16/2018  Labs reviewed Chemo education completed Antiemetics were reviewed Return to clinic in 1 week for toxicity check

## 2018-07-16 NOTE — Progress Notes (Signed)
At 1235, this RN noticed a red, raised rash on patient's left hand and bilateral upper extremities; no other s/s. Hypersensitivity protocol started, [place], [place] to tx area. NS opened and 50mg  benadryl IV given Per Lucianne Lei. VSS. See MAR and VS flow sheet. Upon discharge, patient reeducated on s/s of allergic and hypersensitivity reactions. Pt voiced understanding and will call us if needed.

## 2018-07-22 ENCOUNTER — Inpatient Hospital Stay (HOSPITAL_BASED_OUTPATIENT_CLINIC_OR_DEPARTMENT_OTHER): Payer: Medicare Other | Admitting: Hematology and Oncology

## 2018-07-22 ENCOUNTER — Inpatient Hospital Stay: Payer: Medicare Other

## 2018-07-22 DIAGNOSIS — C50811 Malignant neoplasm of overlapping sites of right female breast: Secondary | ICD-10-CM

## 2018-07-22 DIAGNOSIS — Z171 Estrogen receptor negative status [ER-]: Secondary | ICD-10-CM

## 2018-07-22 DIAGNOSIS — C9002 Multiple myeloma in relapse: Secondary | ICD-10-CM

## 2018-07-22 DIAGNOSIS — C50411 Malignant neoplasm of upper-outer quadrant of right female breast: Secondary | ICD-10-CM | POA: Diagnosis not present

## 2018-07-22 DIAGNOSIS — Z5111 Encounter for antineoplastic chemotherapy: Secondary | ICD-10-CM | POA: Diagnosis not present

## 2018-07-22 LAB — CMP (CANCER CENTER ONLY)
ALT: 6 U/L (ref 0–44)
ANION GAP: 9 (ref 5–15)
AST: 11 U/L — ABNORMAL LOW (ref 15–41)
Albumin: 3.3 g/dL — ABNORMAL LOW (ref 3.5–5.0)
Alkaline Phosphatase: 88 U/L (ref 38–126)
BILIRUBIN TOTAL: 0.3 mg/dL (ref 0.3–1.2)
BUN: 33 mg/dL — ABNORMAL HIGH (ref 8–23)
CO2: 23 mmol/L (ref 22–32)
Calcium: 8.5 mg/dL — ABNORMAL LOW (ref 8.9–10.3)
Chloride: 108 mmol/L (ref 98–111)
Creatinine: 1.46 mg/dL — ABNORMAL HIGH (ref 0.44–1.00)
GFR, Est AFR Am: 40 mL/min — ABNORMAL LOW (ref >60)
GFR, Estimated: 35 mL/min — ABNORMAL LOW (ref >60)
GLUCOSE: 105 mg/dL — AB (ref 70–99)
POTASSIUM: 4.4 mmol/L (ref 3.5–5.1)
Sodium: 140 mmol/L (ref 135–145)
TOTAL PROTEIN: 7 g/dL (ref 6.5–8.1)

## 2018-07-22 LAB — CBC WITH DIFFERENTIAL (CANCER CENTER ONLY)
ABS IMMATURE GRANULOCYTES: 0.02 10*3/uL (ref 0.00–0.07)
Basophils Absolute: 0 10*3/uL (ref 0.0–0.1)
Basophils Relative: 0 %
Eosinophils Absolute: 0.3 10*3/uL (ref 0.0–0.5)
Eosinophils Relative: 4 %
HCT: 31 % — ABNORMAL LOW (ref 36.0–46.0)
Hemoglobin: 9.6 g/dL — ABNORMAL LOW (ref 12.0–15.0)
IMMATURE GRANULOCYTES: 0 %
Lymphocytes Relative: 19 %
Lymphs Abs: 1.3 10*3/uL (ref 0.7–4.0)
MCH: 25.9 pg — ABNORMAL LOW (ref 26.0–34.0)
MCHC: 31 g/dL (ref 30.0–36.0)
MCV: 83.8 fL (ref 80.0–100.0)
MONOS PCT: 2 %
Monocytes Absolute: 0.1 10*3/uL (ref 0.1–1.0)
NEUTROS ABS: 5 10*3/uL (ref 1.7–7.7)
NEUTROS PCT: 75 %
PLATELETS: 225 10*3/uL (ref 150–400)
RBC: 3.7 MIL/uL — AB (ref 3.87–5.11)
RDW: 15 % (ref 11.5–15.5)
WBC: 6.8 10*3/uL (ref 4.0–10.5)
nRBC: 0 % (ref 0.0–0.2)

## 2018-07-22 MED ORDER — HEPARIN SOD (PORK) LOCK FLUSH 100 UNIT/ML IV SOLN
500.0000 [IU] | Freq: Once | INTRAVENOUS | Status: AC
  Administered 2018-07-22: 500 [IU] via INTRAVENOUS
  Filled 2018-07-22: qty 5

## 2018-07-22 MED ORDER — HEPARIN SOD (PORK) LOCK FLUSH 100 UNIT/ML IV SOLN
500.0000 [IU] | Freq: Once | INTRAVENOUS | Status: DC
  Filled 2018-07-22: qty 5

## 2018-07-22 MED ORDER — SODIUM CHLORIDE 0.9% FLUSH
10.0000 mL | Freq: Once | INTRAVENOUS | Status: AC
  Administered 2018-07-22: 10 mL via INTRAVENOUS
  Filled 2018-07-22: qty 10

## 2018-07-22 NOTE — Assessment & Plan Note (Signed)
Original cancer diagnosed 2008 treated with lumpectomy, 2/8 lymph nodes positive, triple negative, adjuvant chemo FEC Taxotere, radiation  06/09/2018 right mastectomy: Grade 3 IDC, 1.2 cm, high-grade DCIS, margins negative, negative for lymphovascular or perineural invasion, ER 0%, PR 0%, HER-2 negative, Ki-67 60%  Current treatment: Adjuvant CMF chemotherapy started 07/16/2018, today's cycle 1 day 8  Chemo toxicities: Patient does not have any nausea vomiting diarrhea or constipation.  Return to clinic in 2 weeks for cycle 2 CMF.

## 2018-07-22 NOTE — Progress Notes (Signed)
Patient Care Team: [name], MD as PCP - Bethena Roys, MD as Consulting Physician (Cardiology) Cottle, Billey Co., MD as Attending Physician (Psychiatry)  DIAGNOSIS:  Encounter Diagnosis  Name Primary?  . Malignant neoplasm of upper-outer quadrant of right breast in female, estrogen receptor negative ([place])     SUMMARY OF ONCOLOGIC HISTORY:   Malignant neoplasm of right breast ([place])   02/03/2007 Surgery    Right breast lumpectomy: Invasive ductal carcinoma 1.5 cm grade 3, 1/2 sentinel nodes positive, ER negative, PR negative, Ki-67 35%, HER-2 negative    03/08/2007 Surgery    Right axillary lymph node dissection: 2/8 lymph nodes positive    04/12/2007 - 08/13/2007 Chemotherapy    FEC and Taxotere    09/20/2007 - 11/15/2007 Radiation Therapy    Adjuvant radiation    04/01/2018 Relapse/Recurrence    3 irregular masses right breast UOQ.  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  She tolerated chemo extremely well.  She does not have any nausea or vomiting.  Denies any diarrhea constipation denies any fevers or chills.  REVIEW OF SYSTEMS:   Constitutional: Denies fevers, chills or abnormal weight loss Eyes: Denies blurriness of vision Ears, nose, mouth, throat, and face: Denies  mucositis or sore throat Respiratory: Denies cough, dyspnea or wheezes Cardiovascular: Denies palpitation, chest discomfort Gastrointestinal:  Denies nausea, heartburn or change in bowel habits Skin: Denies abnormal skin rashes Lymphatics: Denies new lymphadenopathy or easy bruising Neurological:Denies numbness, tingling or new weaknesses Behavioral/Psych: Mood is stable, no new changes  Extremities: No lower extremity edema Breast:  denies any pain or lumps or nodules in either breasts All other systems were reviewed with the patient and are negative.  I have reviewed the past medical history, past surgical history, social history and family history with the patient and they are unchanged from previous note.  ALLERGIES:  is allergic to aspirin; lisinopril; and sudafed [pseudoephedrine hcl].  MEDICATIONS:  Current Outpatient Medications  Medication Sig Dispense Refill  . acetaminophen (TYLENOL) 500 MG tablet Take 500-1,000 mg by mouth every 6 (six) hours as needed (FOR PAIN.).    Marland Kitchen aspirin EC 81 MG tablet Take 81 mg by mouth daily.     Marland Kitchen azelastine (ASTELIN) 0.1 % nasal spray Place 2 sprays into both nostrils 2 (two) times daily. Use in each nostril as directed (Patient taking differently: Place 2 sprays into both nostrils 2 (two) times daily as needed (cold symptoms). Use in each nostril as directed) 30 mL 12  . clonazePAM (KLONOPIN) 0.5 MG tablet TAKE (1) TABLET BY MOUTH AT BEDTIME. 90 tablet 0  . docusate sodium (COLACE) 100 MG capsule Take 200 mg by mouth 2 (two) times daily.     . folic  acid (FOLVITE) 1 MG tablet TAKE ONE TABLET BY MOUTH DAILY. (Patient taking differently: Take 1 mg by mouth daily. ) 100 tablet 1  . HYDROcodone-acetaminophen (NORCO/VICODIN) 5-325 MG tablet Take 1-2 tablets by mouth every 6 (six) hours as needed for moderate pain. 20 tablet 0  . lidocaine-prilocaine (EMLA) cream Apply to affected area once 30 g 3  . lovastatin (MEVACOR) 40 MG tablet TAKE 1 TABLET BY  MOUTH AT BEDTIME 90 tablet 2  . metoCLOPramide (REGLAN) 10 MG tablet TAKE ONE TABLET BY MOUTH TWICE DAILY. 180 tablet 0  . metoprolol succinate (TOPROL-XL) 50 MG 24 hr tablet Take 1 tablet (50 mg total) by mouth See admin instructions. TAKE 1 TABLET BY MOUTH IN THE MORNING AND 1/2 TABLET IN THE EVENING. 30 tablet 1  . montelukast (SINGULAIR) 10 MG tablet TAKE ONE TABLET BY MOUTH AT BEDTIME. (Patient taking differently: Take 10 mg by mouth at bedtime. ) 30 tablet 3  . ondansetron (ZOFRAN) 8 MG tablet Take 1 tablet (8 mg total) by mouth 2 (two) times daily as needed for refractory nausea / vomiting. Start on day 3 after chemotherapy. 30 tablet 1  . pantoprazole (PROTONIX) 40 MG tablet TAKE (1) TABLET BY MOUTH TWICE DAILY. 180 tablet 0  . prochlorperazine (COMPAZINE) 10 MG tablet Take 1 tablet (10 mg total) by mouth every 6 (six) hours as needed (Nausea or vomiting). 30 tablet 1  . sacubitril-valsartan (ENTRESTO) 24-26 MG Take 1 tablet by mouth 2 (two) times daily. 30 tablet 2  . sertraline (ZOLOFT) 50 MG tablet TAKE 1 TABLET BY MOUTH ONCE A DAY. 90 tablet 0  . spironolactone (ALDACTONE) 25 MG tablet TAKE 1 TABLET BY MOUTH ONCE A DAY. (Patient taking differently: Take 25 mg by mouth daily. ) 30 tablet 12  . zolpidem (AMBIEN) 10 MG tablet TAKE 1 TABLET BY MOUTH AT BEDTIME AS NEEDED FOR SLEEP. 30 tablet 1   No current facility-administered medications for this visit.     PHYSICAL EXAMINATION: ECOG PERFORMANCE STATUS: 2 - Symptomatic, <50% confined to bed  Vitals:   07/22/18 1101  BP: (!) 90/54  Pulse: 64  Resp: 17  Temp: 98.1 F (36.7 C)  SpO2: 96%   Filed Weights   07/22/18 1101  Weight: 191 lb 4.8 oz (86.8 kg)    GENERAL:alert, no distress and comfortable SKIN: skin color, texture, turgor are normal, no rashes or significant lesions EYES: normal, Conjunctiva are pink and non-injected, sclera clear OROPHARYNX:no exudate, no erythema and lips, buccal mucosa, and tongue normal  NECK:  supple, thyroid normal size, non-tender, without nodularity LYMPH:  no palpable lymphadenopathy in the cervical, axillary or inguinal LUNGS: clear to auscultation and percussion with normal breathing effort HEART: regular rate & rhythm and no murmurs and no lower extremity edema ABDOMEN:abdomen soft, non-tender and normal bowel sounds MUSCULOSKELETAL:no cyanosis of digits and no clubbing  NEURO: alert & oriented x 3 with fluent speech, no focal motor/sensory deficits EXTREMITIES: No lower extremity edema   LABORATORY DATA:  I have reviewed the data as listed CMP Latest Ref Rng & Units 07/22/2018 07/16/2018 06/10/2018  Glucose 70 - 99 mg/dL 105(H) 103(H) 104(H)  BUN 8 - 23 mg/dL 33(H) 37(H) 19  Creatinine 0.44 - 1.00 mg/dL 1.46(H) 1.39(H) 1.48(H)  Sodium 135 - 145 mmol/L 140 141 140  Potassium 3.5 - 5.1 mmol/L 4.4 4.3 3.9  Chloride 98 - 111 mmol/L 108 109 111  CO2 22 - 32 mmol/L 23 21(L) 23  Calcium 8.9 - 10.3 mg/dL 8.5(L)  9.0 8.0(L)  Total Protein 6.5 - 8.1 g/dL 7.0 7.3 -  Total Bilirubin 0.3 - 1.2 mg/dL 0.3 0.3 -  Alkaline Phos 38 - 126 U/L 88 105 -  AST 15 - 41 U/L 11(L) 14(L) -  ALT 0 - 44 U/L 6 10 -    Lab Results  Component Value Date   WBC 6.8 07/22/2018   HGB 9.6 (L) 07/22/2018   HCT 31.0 (L) 07/22/2018   MCV 83.8 07/22/2018   PLT 225 07/22/2018   NEUTROABS 5.0 07/22/2018    ASSESSMENT & PLAN:  Malignant neoplasm of right breast ([place]) Original cancer diagnosed 2008 treated with lumpectomy, 2/8 lymph nodes positive, triple negative, adjuvant chemo FEC Taxotere, radiation  06/09/2018 right mastectomy: Grade 3 IDC, 1.2 cm, high-grade DCIS, margins negative, negative for lymphovascular or perineural invasion, ER 0%, PR 0%, HER-2 negative, Ki-67 60%  Current treatment: Adjuvant CMF chemotherapy started 07/16/2018, today's cycle 1 day 8  Chemo toxicities: Patient does not have any nausea vomiting diarrhea or constipation.  Return to clinic in 2 weeks for cycle 2  CMF.     No orders of the defined types were placed in this encounter.  The patient has a good understanding of the overall plan. she agrees with it. she will call with any problems that may develop before the next visit here.   Harriette Ohara, MD 07/22/18

## 2018-07-23 ENCOUNTER — Other Ambulatory Visit: Payer: Self-pay | Admitting: Internal Medicine

## 2018-07-23 NOTE — Progress Notes (Signed)
    DATE:  07/16/2018                                          X CHEMO/IMMUNOTHERAPY REACTION             MD: Dr. Lindi Adie    AGENT/BLOOD PRODUCT RECEIVING TODAY:               CMF   AGENT/BLOOD PRODUCT RECEIVING IMMEDIATELY PRIOR TO REACTION:           Cytoxan   VS: BP:      92/55   P:        61       SPO2:        98% on room air                BP:      108/61        REACTION(S):            Erythematous and pruritic rash of the bilateral upper extremities   PREMEDS:      Aloxi   INTERVENTION: Benadryl 50 mg IV x1   Review of Systems  Review of Systems  Constitutional: Negative for chills, diaphoresis and fever.  HENT: Negative for trouble swallowing and voice change.   Respiratory: Negative for cough, chest tightness, shortness of breath and wheezing.   Cardiovascular: Negative for chest pain and palpitations.  Gastrointestinal: Negative for abdominal pain, constipation, diarrhea, nausea and vomiting.  Musculoskeletal: Negative for back pain and myalgias.  Skin: Positive for rash.  Neurological: Negative for dizziness, light-headedness and headaches.     Physical Exam  Physical Exam  Constitutional: No distress.  HENT:  Head: Normocephalic and atraumatic.  Cardiovascular: Normal rate, regular rhythm and normal heart sounds. Exam reveals no gallop and no friction rub.  No murmur heard. Pulmonary/Chest: Effort normal and breath sounds normal. No respiratory distress. She has no wheezes. She has no rales.  Neurological: She is alert.  Skin: Skin is warm and dry. Rash noted. She is not diaphoretic. There is erythema.  Diffuse macular erythematous rash over the bilateral upper extremities    OUTCOME:                 The patient's rash resolved after dosing with IV Benadryl.  She completed the remainder of her chemotherapy without any issues of concern.   Sandi Mealy, MHS, PA-C

## 2018-08-05 ENCOUNTER — Ambulatory Visit (INDEPENDENT_AMBULATORY_CARE_PROVIDER_SITE_OTHER): Payer: Medicare Other | Admitting: Family Medicine

## 2018-08-05 ENCOUNTER — Encounter: Payer: Self-pay | Admitting: Family Medicine

## 2018-08-05 VITALS — BP 110/60 | HR 57 | Resp 12 | Ht <= 58 in | Wt 190.0 lb

## 2018-08-05 DIAGNOSIS — I428 Other cardiomyopathies: Secondary | ICD-10-CM

## 2018-08-05 DIAGNOSIS — E7800 Pure hypercholesterolemia, unspecified: Secondary | ICD-10-CM

## 2018-08-05 DIAGNOSIS — E559 Vitamin D deficiency, unspecified: Secondary | ICD-10-CM

## 2018-08-05 DIAGNOSIS — Z0000 Encounter for general adult medical examination without abnormal findings: Secondary | ICD-10-CM

## 2018-08-05 DIAGNOSIS — R7303 Prediabetes: Secondary | ICD-10-CM

## 2018-08-05 DIAGNOSIS — R002 Palpitations: Secondary | ICD-10-CM

## 2018-08-05 DIAGNOSIS — E8881 Metabolic syndrome: Secondary | ICD-10-CM

## 2018-08-05 DIAGNOSIS — Z800 Family history of malignant neoplasm of digestive organs: Secondary | ICD-10-CM

## 2018-08-05 DIAGNOSIS — D126 Benign neoplasm of colon, unspecified: Secondary | ICD-10-CM

## 2018-08-05 NOTE — Progress Notes (Signed)
    Tara [name]     MRN: [id number]      DOB: November 02, 1945  HPI: Patient is in for annual physical exam. Currently being treated with chemotherapy for recurrent right breast cancer , 11 years after initial diagnosis  Reports no adverse s/e from treatment to date, has had 1 of 6 treatments to date C/orecent intermittent palpitations of short duration, with no light headedness , chest pain, PND, orthopnea or leg swelling. Has not seen cardiology for over 1 year will refer Immunization is reviewed , and  She elects to defer flu vaccine until she sees Oncology in the morning   PE: BP 110/60   Pulse (!) 57   Resp 12   Ht 4\' 9"  (1.448 m)   Wt 190 lb (86.2 kg)   SpO2 96% Comment: room air  BMI 41.12 kg/m   Pleasant  female, alert and oriented x 3, in no cardio-pulmonary distress. Afebrile. HEENT No facial trauma or asymetry. Sinuses non tender.  Extra occullar muscles intact, pupils equally reactive to light. External ears normal, tympanic membranes clear. Oropharynx moist, no exudate.POoor dentition Neck: supple, no adenopathy,JVD or thyromegaly.No bruits.  Chest: Clear to ascultation bilaterally.No crackles or wheezes. Non tender to palpation  Breast: Right mastectomy, scar well healed, has crusted scab still present, no erythema or drainage Left breast: no mass or nipple discharge, no supraclavicular or axillary lymphadenopathy  Cardiovascular system; Heart sounds normal,  S1 and  S2 ,no S3.  No murmur, or thrill. Apical beat not displaced Peripheral pulses normal.  Abdomen: Soft, non tender, no organomegaly or masses. No bruits. Bowel sounds normal. No guarding, tenderness or rebound.  Colon screen, will address at next visit as currnetly just starting breast cancer treatment,     Musculoskeletal exam: Full ROM of spine, hips , shoulders and knees. No deformity ,swelling or crepitus noted. No muscle wasting or atrophy.   Neurologic: Cranial nerves 2 to 12  intact. Power, tone ,sensation and reflexes normal throughout. No disturbance in gait. No tremor.  Skin: Intact, no ulceration, erythema , scaling or rash noted. Pigmentation normal throughout  Psych; Normal mood and affect. Judgement and concentration normal   Assessment & Plan:  Annual physical exam Annual exam as documented. Counseling done  re healthy lifestyle involving commitment to 150 minutes exercise per week, heart healthy diet, and attaining healthy weight.The importance of adequate sleep also discussed.  Immunization and cancer screening needs are specifically addressed at this visit.   Nonischemic cardiomyopathy ([place]) Reports recent episodes of asymptomatic palpitations, duration less than 2 minutes. Cardiology follow up past due , will refer

## 2018-08-05 NOTE — Patient Instructions (Addendum)
F./U in 4.5 months, call if you need me before  Please get fasting lipid, cmp amd EGFr, TSH, vit D and CBC tomorrow at [facility] at the center please collect at checkout  I will refer you to cardiology, due to recent palpitaions  Pls ask about and get the flu vaccine it is  indicated   All the best with treatment and keep your faith where it is

## 2018-08-06 ENCOUNTER — Encounter: Payer: Self-pay | Admitting: Hematology and Oncology

## 2018-08-06 ENCOUNTER — Ambulatory Visit (HOSPITAL_BASED_OUTPATIENT_CLINIC_OR_DEPARTMENT_OTHER): Payer: Medicare Other | Admitting: Medical

## 2018-08-06 ENCOUNTER — Inpatient Hospital Stay: Payer: Medicare Other

## 2018-08-06 ENCOUNTER — Encounter: Payer: Self-pay | Admitting: Oncology

## 2018-08-06 ENCOUNTER — Encounter: Payer: Self-pay | Admitting: Family Medicine

## 2018-08-06 ENCOUNTER — Inpatient Hospital Stay: Payer: Medicare Other | Attending: Hematology and Oncology

## 2018-08-06 ENCOUNTER — Inpatient Hospital Stay (HOSPITAL_BASED_OUTPATIENT_CLINIC_OR_DEPARTMENT_OTHER): Payer: Medicare Other | Admitting: Oncology

## 2018-08-06 VITALS — BP 89/39 | HR 55 | Temp 98.2°F | Resp 18

## 2018-08-06 VITALS — BP 105/61 | HR 61 | Temp 98.2°F | Resp 18 | Ht <= 58 in | Wt 190.8 lb

## 2018-08-06 DIAGNOSIS — Z5111 Encounter for antineoplastic chemotherapy: Secondary | ICD-10-CM | POA: Diagnosis present

## 2018-08-06 DIAGNOSIS — Z95828 Presence of other vascular implants and grafts: Secondary | ICD-10-CM

## 2018-08-06 DIAGNOSIS — Z171 Estrogen receptor negative status [ER-]: Secondary | ICD-10-CM

## 2018-08-06 DIAGNOSIS — R21 Rash and other nonspecific skin eruption: Secondary | ICD-10-CM | POA: Insufficient documentation

## 2018-08-06 DIAGNOSIS — C50811 Malignant neoplasm of overlapping sites of right female breast: Secondary | ICD-10-CM

## 2018-08-06 DIAGNOSIS — C50411 Malignant neoplasm of upper-outer quadrant of right female breast: Secondary | ICD-10-CM

## 2018-08-06 DIAGNOSIS — D126 Benign neoplasm of colon, unspecified: Secondary | ICD-10-CM | POA: Insufficient documentation

## 2018-08-06 LAB — CBC WITH DIFFERENTIAL (CANCER CENTER ONLY)
ABS IMMATURE GRANULOCYTES: 0.03 10*3/uL (ref 0.00–0.07)
Basophils Absolute: 0 10*3/uL (ref 0.0–0.1)
Basophils Relative: 0 %
Eosinophils Absolute: 0.2 10*3/uL (ref 0.0–0.5)
Eosinophils Relative: 3 %
HCT: 33.9 % — ABNORMAL LOW (ref 36.0–46.0)
HEMOGLOBIN: 10.6 g/dL — AB (ref 12.0–15.0)
IMMATURE GRANULOCYTES: 1 %
LYMPHS PCT: 28 %
Lymphs Abs: 1.3 10*3/uL (ref 0.7–4.0)
MCH: 26.2 pg (ref 26.0–34.0)
MCHC: 31.3 g/dL (ref 30.0–36.0)
MCV: 83.7 fL (ref 80.0–100.0)
MONO ABS: 0.6 10*3/uL (ref 0.1–1.0)
MONOS PCT: 13 %
NEUTROS ABS: 2.7 10*3/uL (ref 1.7–7.7)
NEUTROS PCT: 55 %
Platelet Count: 228 10*3/uL (ref 150–400)
RBC: 4.05 MIL/uL (ref 3.87–5.11)
RDW: 15.9 % — ABNORMAL HIGH (ref 11.5–15.5)
WBC: 4.8 10*3/uL (ref 4.0–10.5)
nRBC: 0 % (ref 0.0–0.2)

## 2018-08-06 LAB — CMP (CANCER CENTER ONLY)
ALK PHOS: 124 U/L (ref 38–126)
ALT: 9 U/L (ref 0–44)
ANION GAP: 10 (ref 5–15)
AST: 13 U/L — ABNORMAL LOW (ref 15–41)
Albumin: 3.4 g/dL — ABNORMAL LOW (ref 3.5–5.0)
BILIRUBIN TOTAL: 0.3 mg/dL (ref 0.3–1.2)
BUN: 26 mg/dL — ABNORMAL HIGH (ref 8–23)
CO2: 21 mmol/L — ABNORMAL LOW (ref 22–32)
Calcium: 8.6 mg/dL — ABNORMAL LOW (ref 8.9–10.3)
Chloride: 108 mmol/L (ref 98–111)
Creatinine: 1.66 mg/dL — ABNORMAL HIGH (ref 0.44–1.00)
GFR, EST NON AFRICAN AMERICAN: 30 mL/min — AB (ref >60)
GFR, Est AFR Am: 34 mL/min — ABNORMAL LOW (ref >60)
Glucose, Bld: 100 mg/dL — ABNORMAL HIGH (ref 70–99)
POTASSIUM: 5 mmol/L (ref 3.5–5.1)
Sodium: 139 mmol/L (ref 135–145)
TOTAL PROTEIN: 7.1 g/dL (ref 6.5–8.1)

## 2018-08-06 MED ORDER — SODIUM CHLORIDE 0.9% FLUSH
10.0000 mL | INTRAVENOUS | Status: DC | PRN
  Administered 2018-08-06: 10 mL
  Filled 2018-08-06: qty 10

## 2018-08-06 MED ORDER — METHOTREXATE SODIUM (PF) CHEMO INJECTION 250 MG/10ML
39.9000 mg/m2 | Freq: Once | INTRAMUSCULAR | Status: AC
  Administered 2018-08-06: 75 mg via INTRAVENOUS
  Filled 2018-08-06: qty 3

## 2018-08-06 MED ORDER — PALONOSETRON HCL INJECTION 0.25 MG/5ML
INTRAVENOUS | Status: AC
  Filled 2018-08-06: qty 5

## 2018-08-06 MED ORDER — FLUOROURACIL CHEMO INJECTION 2.5 GM/50ML
600.0000 mg/m2 | Freq: Once | INTRAVENOUS | Status: AC
  Administered 2018-08-06: 1150 mg via INTRAVENOUS
  Filled 2018-08-06: qty 23

## 2018-08-06 MED ORDER — PALONOSETRON HCL INJECTION 0.25 MG/5ML
0.2500 mg | Freq: Once | INTRAVENOUS | Status: AC
  Administered 2018-08-06: 0.25 mg via INTRAVENOUS

## 2018-08-06 MED ORDER — FAMOTIDINE IN NACL 20-0.9 MG/50ML-% IV SOLN
20.0000 mg | Freq: Once | INTRAVENOUS | Status: AC
  Administered 2018-08-06: 20 mg via INTRAVENOUS

## 2018-08-06 MED ORDER — DIPHENHYDRAMINE HCL 50 MG/ML IJ SOLN
INTRAMUSCULAR | Status: AC
  Filled 2018-08-06: qty 1

## 2018-08-06 MED ORDER — HEPARIN SOD (PORK) LOCK FLUSH 100 UNIT/ML IV SOLN
500.0000 [IU] | Freq: Once | INTRAVENOUS | Status: AC | PRN
  Administered 2018-08-06: 500 [IU]
  Filled 2018-08-06: qty 5

## 2018-08-06 MED ORDER — SODIUM CHLORIDE 0.9 % IV SOLN
600.0000 mg/m2 | Freq: Once | INTRAVENOUS | Status: AC
  Administered 2018-08-06: 1120 mg via INTRAVENOUS
  Filled 2018-08-06: qty 56

## 2018-08-06 MED ORDER — SODIUM CHLORIDE 0.9% FLUSH
10.0000 mL | INTRAVENOUS | Status: DC | PRN
  Administered 2018-08-06: 10 mL via INTRAVENOUS
  Filled 2018-08-06: qty 10

## 2018-08-06 MED ORDER — DIPHENHYDRAMINE HCL 50 MG/ML IJ SOLN
25.0000 mg | Freq: Once | INTRAMUSCULAR | Status: AC
  Administered 2018-08-06: 25 mg via INTRAVENOUS

## 2018-08-06 MED ORDER — SODIUM CHLORIDE 0.9 % IV SOLN
Freq: Once | INTRAVENOUS | Status: AC
  Administered 2018-08-06: 11:00:00 via INTRAVENOUS
  Filled 2018-08-06: qty 250

## 2018-08-06 NOTE — Assessment & Plan Note (Signed)
Original cancer diagnosed 2008 treated with lumpectomy, 2/8 lymph nodes positive, triple negative, adjuvant chemo FEC Taxotere, radiation  06/09/2018 right mastectomy: Grade 3 IDC, 1.2 cm, high-grade DCIS, margins negative, negative for lymphovascular or perineural invasion, ER 0%, PR 0%, HER-2 negative, Ki-67 60%  Current treatment: Adjuvant CMF chemotherapy started 07/16/2018.  Today is day 1 of cycle 2. Chemo toxicities: Patient does not have any nausea vomiting diarrhea or constipation.  She had a mild rash during her Cytoxan infusion with cycle 1.  Rash resolved with Benadryl.  -We will add Benadryl 25 mill grams IV as a premedication today. -We will slow infusion rate of Cytoxan down so that will infuse over 45 minutes. -Creatinine is stable at 1.6 and adequate to proceed with treatment today. -Follow-up in 3 weeks for evaluation prior to cycle #3.

## 2018-08-06 NOTE — Patient Instructions (Signed)
Implanted Port Home Guide An implanted port is a type of central line that is placed under the skin. Central lines are used to provide IV access when treatment or nutrition needs to be given through a person's veins. Implanted ports are used for long-term IV access. An implanted port may be placed because:  You need IV medicine that would be irritating to the small veins in your hands or arms.  You need long-term IV medicines, such as antibiotics.  You need IV nutrition for a long period.  You need frequent blood draws for lab tests.  You need dialysis.  Implanted ports are usually placed in the chest area, but they can also be placed in the upper arm, the abdomen, or the leg. An implanted port has two main parts:  Reservoir. The reservoir is round and will appear as a small, raised area under your skin. The reservoir is the part where a needle is inserted to give medicines or draw blood.  Catheter. The catheter is a thin, flexible tube that extends from the reservoir. The catheter is placed into a large vein. Medicine that is inserted into the reservoir goes into the catheter and then into the vein.  How will I care for my incision site? Do not get the incision site wet. Bathe or shower as directed by your health care provider. How is my port accessed? Special steps must be taken to access the port:  Before the port is accessed, a numbing cream can be placed on the skin. This helps numb the skin over the port site.  Your health care provider uses a sterile technique to access the port. ? Your health care provider must put on a mask and sterile gloves. ? The skin over your port is cleaned carefully with an antiseptic and allowed to dry. ? The port is gently pinched between sterile gloves, and a needle is inserted into the port.  Only "non-coring" port needles should be used to access the port. Once the port is accessed, a blood return should be checked. This helps ensure that the port  is in the vein and is not clogged.  If your port needs to remain accessed for a constant infusion, a clear (transparent) bandage will be placed over the needle site. The bandage and needle will need to be changed every week, or as directed by your health care provider.  Keep the bandage covering the needle clean and dry. Do not get it wet. Follow your health care provider's instructions on how to take a shower or bath while the port is accessed.  If your port does not need to stay accessed, no bandage is needed over the port.  What is flushing? Flushing helps keep the port from getting clogged. Follow your health care provider's instructions on how and when to flush the port. Ports are usually flushed with saline solution or a medicine called heparin. The need for flushing will depend on how the port is used.  If the port is used for intermittent medicines or blood draws, the port will need to be flushed: ? After medicines have been given. ? After blood has been drawn. ? As part of routine maintenance.  If a constant infusion is running, the port may not need to be flushed.  How long will my port stay implanted? The port can stay in for as long as your health care provider thinks it is needed. When it is time for the port to come out, surgery will be   done to remove it. The procedure is similar to the one performed when the port was put in. When should I seek immediate medical care? When you have an implanted port, you should seek immediate medical care if:  You notice a bad smell coming from the incision site.  You have swelling, redness, or drainage at the incision site.  You have more swelling or pain at the port site or the surrounding area.  You have a fever that is not controlled with medicine.  This information is not intended to replace advice given to you by your health care provider. Make sure you discuss any questions you have with your health care provider. Document  Released: 09/08/2005 Document Revised: 02/14/2016 Document Reviewed: 05/16/2013 [employer] Interactive Patient Education  2017 [employer]  

## 2018-08-06 NOTE — Assessment & Plan Note (Signed)
Reports recent episodes of asymptomatic palpitations, duration less than 2 minutes. Cardiology follow up past due , will refer

## 2018-08-06 NOTE — Patient Instructions (Signed)
[facility] Discharge Instructions for Patients Receiving Chemotherapy  Today you received the following chemotherapy agents: Cytoxan, Methotrexate, & 5FU.  To help prevent nausea and vomiting after your treatment, we encourage you to take your nausea medication as prescribed. If you develop nausea and vomiting that is not controlled by your nausea medication, call the clinic.   BELOW ARE SYMPTOMS THAT SHOULD BE REPORTED IMMEDIATELY:  *FEVER GREATER THAN 100.5 F  *CHILLS WITH OR WITHOUT FEVER  NAUSEA AND VOMITING THAT IS NOT CONTROLLED WITH YOUR NAUSEA MEDICATION  *UNUSUAL SHORTNESS OF BREATH  *UNUSUAL BRUISING OR BLEEDING  TENDERNESS IN MOUTH AND THROAT WITH OR WITHOUT PRESENCE OF ULCERS  *URINARY PROBLEMS  *BOWEL PROBLEMS  UNUSUAL RASH Items with * indicate a potential emergency and should be followed up as soon as possible.  Feel free to call the clinic should you have any questions or concerns. The clinic phone number is [phone].  Please show the [facility] [facility] at check-in to the Emergency Department and triage nurse.   Cyclophosphamide injection What is this medicine? CYCLOPHOSPHAMIDE (sye kloe FOSS fa mide) is a chemotherapy drug. It slows the growth of cancer cells. This medicine is used to treat many types of cancer like lymphoma, myeloma, leukemia, breast cancer, and ovarian cancer, to name a few. This medicine may be used for other purposes; ask your health care provider or pharmacist if you have questions. COMMON BRAND NAME(S): Cytoxan, Neosar What should I tell my health care provider before I take this medicine? They need to know if you have any of these conditions: -blood disorders -history of other chemotherapy -infection -kidney disease -liver disease -recent or ongoing radiation therapy -tumors in the bone marrow -an unusual or allergic reaction to cyclophosphamide, other chemotherapy, other medicines, foods, dyes, or  preservatives -pregnant or trying to get pregnant -breast-feeding How should I use this medicine? This drug is usually given as an injection into a vein or muscle or by infusion into a vein. It is administered in a hospital or clinic by a specially trained health care professional. Talk to your pediatrician regarding the use of this medicine in children. Special care may be needed. Overdosage: If you think you have taken too much of this medicine contact a poison control center or emergency room at once. NOTE: This medicine is only for you. Do not share this medicine with others. What if I miss a dose? It is important not to miss your dose. Call your doctor or health care professional if you are unable to keep an appointment. What may interact with this medicine? This medicine may interact with the following medications: -amiodarone -amphotericin B -azathioprine -certain antiviral medicines for HIV or AIDS such as protease inhibitors (e.g., indinavir, ritonavir) and zidovudine -certain blood pressure medications such as benazepril, captopril, enalapril, fosinopril, lisinopril, moexipril, monopril, perindopril, quinapril, ramipril, trandolapril -certain cancer medications such as anthracyclines (e.g., daunorubicin, doxorubicin), busulfan, cytarabine, paclitaxel, pentostatin, tamoxifen, trastuzumab -certain diuretics such as chlorothiazide, chlorthalidone, hydrochlorothiazide, indapamide, metolazone -certain medicines that treat or prevent blood clots like warfarin -certain muscle relaxants such as succinylcholine -cyclosporine -etanercept -indomethacin -medicines to increase blood counts like filgrastim, pegfilgrastim, sargramostim -medicines used as general anesthesia -metronidazole -natalizumab This list may not describe all possible interactions. Give your health care provider a list of all the medicines, herbs, non-prescription drugs, or dietary supplements you use. Also tell them if  you smoke, drink alcohol, or use illegal drugs. Some items may interact with your medicine. What should I watch  for while using this medicine? Visit your doctor for checks on your progress. This drug may make you feel generally unwell. This is not uncommon, as chemotherapy can affect healthy cells as well as cancer cells. Report any side effects. Continue your course of treatment even though you feel ill unless your doctor tells you to stop. Drink water or other fluids as directed. Urinate often, even at night. In some cases, you may be given additional medicines to help with side effects. Follow all directions for their use. Call your doctor or health care professional for advice if you get a fever, chills or sore throat, or other symptoms of a cold or flu. Do not treat yourself. This drug decreases your body's ability to fight infections. Try to avoid being around people who are sick. This medicine may increase your risk to bruise or bleed. Call your doctor or health care professional if you notice any unusual bleeding. Be careful brushing and flossing your teeth or using a toothpick because you may get an infection or bleed more easily. If you have any dental work done, tell your dentist you are receiving this medicine. You may get drowsy or dizzy. Do not drive, use machinery, or do anything that needs mental alertness until you know how this medicine affects you. Do not become pregnant while taking this medicine or for 1 year after stopping it. Women should inform their doctor if they wish to become pregnant or think they might be pregnant. Men should not father a child while taking this medicine and for 4 months after stopping it. There is a potential for serious side effects to an unborn child. Talk to your health care professional or pharmacist for more information. Do not breast-feed an infant while taking this medicine. This medicine may interfere with the ability to have a child. This medicine  has caused ovarian failure in some women. This medicine has caused reduced sperm counts in some men. You should talk with your doctor or health care professional if you are concerned about your fertility. If you are going to have surgery, tell your doctor or health care professional that you have taken this medicine. What side effects may I notice from receiving this medicine? Side effects that you should report to your doctor or health care professional as soon as possible: -allergic reactions like skin rash, itching or hives, swelling of the face, lips, or tongue -low blood counts - this medicine may decrease the number of white blood cells, red blood cells and platelets. You may be at increased risk for infections and bleeding. -signs of infection - fever or chills, cough, sore throat, pain or difficulty passing urine -signs of decreased platelets or bleeding - bruising, pinpoint red spots on the skin, black, tarry stools, blood in the urine -signs of decreased red blood cells - unusually weak or tired, fainting spells, lightheadedness -breathing problems -dark urine -dizziness -palpitations -swelling of the ankles, feet, hands -trouble passing urine or change in the amount of urine -weight gain -yellowing of the eyes or skin Side effects that usually do not require medical attention (report to your doctor or health care professional if they continue or are bothersome): -changes in nail or skin color -hair loss -missed menstrual periods -mouth sores -nausea, vomiting This list may not describe all possible side effects. Call your doctor for medical advice about side effects. You may report side effects to FDA at 1-800-FDA-1088. Where should I keep my medicine? This drug is given in a hospital  or clinic and will not be stored at home. NOTE: This sheet is a summary. It may not cover all possible information. If you have questions about this medicine, talk to your doctor, pharmacist, or  health care provider.  2018 Elsevier/Gold Standard (2012-07-23 16:22:58)   Methotrexate injection What is this medicine? METHOTREXATE (METH oh TREX ate) is a chemotherapy drug used to treat cancer including breast cancer, leukemia, and lymphoma. This medicine can also be used to treat psoriasis and certain kinds of arthritis. This medicine may be used for other purposes; ask your health care provider or pharmacist if you have questions. What should I tell my health care provider before I take this medicine? They need to know if you have any of these conditions: -fluid in the stomach area or lungs -if you often drink alcohol -infection or immune system problems -kidney disease -liver disease -low blood counts, like low white cell, platelet, or red cell counts -lung disease -radiation therapy -stomach ulcers -ulcerative colitis -an unusual or allergic reaction to methotrexate, other medicines, foods, dyes, or preservatives -pregnant or trying to get pregnant -breast-feeding How should I use this medicine? This medicine is for infusion into a vein or for injection into muscle or into the spinal fluid (whichever applies). It is usually given by a health care professional in a hospital or clinic setting. In rare cases, you might get this medicine at home. You will be taught how to give this medicine. Use exactly as directed. Take your medicine at regular intervals. Do not take your medicine more often than directed. If this medicine is used for arthritis or psoriasis, it should be taken weekly, NOT daily. It is important that you put your used needles and syringes in a special sharps container. Do not put them in a trash can. If you do not have a sharps container, call your pharmacist or healthcare provider to get one. Talk to your pediatrician regarding the use of this medicine in children. While this drug may be prescribed for children as young as 2 years for selected conditions,  precautions do apply. Overdosage: If you think you have taken too much of this medicine contact a poison control center or emergency room at once. NOTE: This medicine is only for you. Do not share this medicine with others. What if I miss a dose? It is important not to miss your dose. Call your doctor or health care professional if you are unable to keep an appointment. If you give yourself the medicine and you miss a dose, talk with your doctor or health care professional. Do not take double or extra doses. What may interact with this medicine? This medicine may interact with the following medications: -acitretin -aspirin or aspirin-like medicines including salicylates -azathioprine -certain antibiotics like chloramphenicol, penicillin, tetracycline -certain medicines for stomach problems like esomeprazole, omeprazole, pantoprazole -cyclosporine -gold -hydroxychloroquine -live virus vaccines -mercaptopurine -NSAIDs, medicines for pain and inflammation, like ibuprofen or naproxen -other cytotoxic agents -penicillamine -phenylbutazone -phenytoin -probenacid -retinoids such as isotretinoin and tretinoin -steroid medicines like prednisone or cortisone -sulfonamides like sulfasalazine and trimethoprim/sulfamethoxazole -theophylline This list may not describe all possible interactions. Give your health care provider a list of all the medicines, herbs, non-prescription drugs, or dietary supplements you use. Also tell them if you smoke, drink alcohol, or use illegal drugs. Some items may interact with your medicine. What should I watch for while using this medicine? Avoid alcoholic drinks. In some cases, you may be given additional medicines to help with side effects.  Follow all directions for their use. This medicine can make you more sensitive to the sun. Keep out of the sun. If you cannot avoid being in the sun, wear protective clothing and use sunscreen. Do not use sun lamps or tanning  beds/booths. You may get drowsy or dizzy. Do not drive, use machinery, or do anything that needs mental alertness until you know how this medicine affects you. Do not stand or sit up quickly, especially if you are an older patient. This reduces the risk of dizzy or fainting spells. You may need blood work done while you are taking this medicine. Call your doctor or health care professional for advice if you get a fever, chills or sore throat, or other symptoms of a cold or flu. Do not treat yourself. This drug decreases your body's ability to fight infections. Try to avoid being around people who are sick. This medicine may increase your risk to bruise or bleed. Call your doctor or health care professional if you notice any unusual bleeding. Check with your doctor or health care professional if you get an attack of severe diarrhea, nausea and vomiting, or if you sweat a lot. The loss of too much body fluid can make it dangerous for you to take this medicine. Talk to your doctor about your risk of cancer. You may be more at risk for certain types of cancers if you take this medicine. Both men and women must use effective birth control with this medicine. Do not become pregnant while taking this medicine or until at least 1 normal menstrual cycle has occurred after stopping it. Women should inform their doctor if they wish to become pregnant or think they might be pregnant. Men should not father a child while taking this medicine and for 3 months after stopping it. There is a potential for serious side effects to an unborn child. Talk to your health care professional or pharmacist for more information. Do not breast-feed an infant while taking this medicine. What side effects may I notice from receiving this medicine? Side effects that you should report to your doctor or health care professional as soon as possible: -allergic reactions like skin rash, itching or hives, swelling of the face, lips, or  tongue -back pain -breathing problems or shortness of breath -confusion -diarrhea -dry, nonproductive cough -low blood counts - this medicine may decrease the number of white blood cells, red blood cells and platelets. You may be at increased risk of infections and bleeding -mouth sores -redness, blistering, peeling or loosening of the skin, including inside the mouth -seizures -severe headaches -signs of infection - fever or chills, cough, sore throat, pain or difficulty passing urine -signs and symptoms of bleeding such as bloody or black, tarry stools; red or dark-brown urine; spitting up blood or brown material that looks like coffee grounds; red spots on the skin; unusual bruising or bleeding from the eye, gums, or nose -signs and symptoms of kidney injury like trouble passing urine or change in the amount of urine -signs and symptoms of liver injury like dark yellow or brown urine; general ill feeling or flu-like symptoms; light-colored stools; loss of appetite; nausea; right upper belly pain; unusually weak or tired; yellowing of the eyes or skin -stiff neck -vomiting Side effects that usually do not require medical attention (report to your doctor or health care professional if they continue or are bothersome): -dizziness -hair loss -headache -stomach pain -upset stomach This list may not describe all possible side effects. Call  your doctor for medical advice about side effects. You may report side effects to FDA at 1-800-FDA-1088. Where should I keep my medicine? If you are using this medicine at home, you will be instructed on how to store this medicine. Throw away any unused medicine after the expiration date on the label. NOTE: This sheet is a summary. It may not cover all possible information. If you have questions about this medicine, talk to your doctor, pharmacist, or health care provider.  2018 Elsevier/Gold Standard (2014-12-28 12:36:41)  Fluorouracil, 5-FU  injection What is this medicine? FLUOROURACIL, 5-FU (flure oh YOOR a sil) is a chemotherapy drug. It slows the growth of cancer cells. This medicine is used to treat many types of cancer like breast cancer, colon or rectal cancer, pancreatic cancer, and stomach cancer. This medicine may be used for other purposes; ask your health care provider or pharmacist if you have questions. COMMON BRAND NAME(S): Adrucil What should I tell my health care provider before I take this medicine? They need to know if you have any of these conditions: -blood disorders -dihydropyrimidine dehydrogenase (DPD) deficiency -infection (especially a virus infection such as chickenpox, cold sores, or herpes) -kidney disease -liver disease -malnourished, poor nutrition -recent or ongoing radiation therapy -an unusual or allergic reaction to fluorouracil, other chemotherapy, other medicines, foods, dyes, or preservatives -pregnant or trying to get pregnant -breast-feeding How should I use this medicine? This drug is given as an infusion or injection into a vein. It is administered in a hospital or clinic by a specially trained health care professional. Talk to your pediatrician regarding the use of this medicine in children. Special care may be needed. Overdosage: If you think you have taken too much of this medicine contact a poison control center or emergency room at once. NOTE: This medicine is only for you. Do not share this medicine with others. What if I miss a dose? It is important not to miss your dose. Call your doctor or health care professional if you are unable to keep an appointment. What may interact with this medicine? -allopurinol -cimetidine -dapsone -digoxin -hydroxyurea -leucovorin -levamisole -medicines for seizures like ethotoin, fosphenytoin, phenytoin -medicines to increase blood counts like filgrastim, pegfilgrastim, sargramostim -medicines that treat or prevent blood clots like  warfarin, enoxaparin, and dalteparin -methotrexate -metronidazole -pyrimethamine -some other chemotherapy drugs like busulfan, cisplatin, estramustine, vinblastine -trimethoprim -trimetrexate -vaccines Talk to your doctor or health care professional before taking any of these medicines: -acetaminophen -aspirin -ibuprofen -ketoprofen -naproxen This list may not describe all possible interactions. Give your health care provider a list of all the medicines, herbs, non-prescription drugs, or dietary supplements you use. Also tell them if you smoke, drink alcohol, or use illegal drugs. Some items may interact with your medicine. What should I watch for while using this medicine? Visit your doctor for checks on your progress. This drug may make you feel generally unwell. This is not uncommon, as chemotherapy can affect healthy cells as well as cancer cells. Report any side effects. Continue your course of treatment even though you feel ill unless your doctor tells you to stop. In some cases, you may be given additional medicines to help with side effects. Follow all directions for their use. Call your doctor or health care professional for advice if you get a fever, chills or sore throat, or other symptoms of a cold or flu. Do not treat yourself. This drug decreases your body's ability to fight infections. Try to avoid being around people who  are sick. This medicine may increase your risk to bruise or bleed. Call your doctor or health care professional if you notice any unusual bleeding. Be careful brushing and flossing your teeth or using a toothpick because you may get an infection or bleed more easily. If you have any dental work done, tell your dentist you are receiving this medicine. Avoid taking products that contain aspirin, acetaminophen, ibuprofen, naproxen, or ketoprofen unless instructed by your doctor. These medicines may hide a fever. Do not become pregnant while taking this medicine.  Women should inform their doctor if they wish to become pregnant or think they might be pregnant. There is a potential for serious side effects to an unborn child. Talk to your health care professional or pharmacist for more information. Do not breast-feed an infant while taking this medicine. Men should inform their doctor if they wish to father a child. This medicine may lower sperm counts. Do not treat diarrhea with over the counter products. Contact your doctor if you have diarrhea that lasts more than 2 days or if it is severe and watery. This medicine can make you more sensitive to the sun. Keep out of the sun. If you cannot avoid being in the sun, wear protective clothing and use sunscreen. Do not use sun lamps or tanning beds/booths. What side effects may I notice from receiving this medicine? Side effects that you should report to your doctor or health care professional as soon as possible: -allergic reactions like skin rash, itching or hives, swelling of the face, lips, or tongue -low blood counts - this medicine may decrease the number of white blood cells, red blood cells and platelets. You may be at increased risk for infections and bleeding. -signs of infection - fever or chills, cough, sore throat, pain or difficulty passing urine -signs of decreased platelets or bleeding - bruising, pinpoint red spots on the skin, black, tarry stools, blood in the urine -signs of decreased red blood cells - unusually weak or tired, fainting spells, lightheadedness -breathing problems -changes in vision -chest pain -mouth sores -nausea and vomiting -pain, swelling, redness at site where injected -pain, tingling, numbness in the hands or feet -redness, swelling, or sores on hands or feet -stomach pain -unusual bleeding Side effects that usually do not require medical attention (report to your doctor or health care professional if they continue or are bothersome): -changes in finger or toe  nails -diarrhea -dry or itchy skin -hair loss -headache -loss of appetite -sensitivity of eyes to the light -stomach upset -unusually teary eyes This list may not describe all possible side effects. Call your doctor for medical advice about side effects. You may report side effects to FDA at 1-800-FDA-1088. Where should I keep my medicine? This drug is given in a hospital or clinic and will not be stored at home. NOTE: This sheet is a summary. It may not cover all possible information. If you have questions about this medicine, talk to your doctor, pharmacist, or health care provider.  2018 Elsevier/Gold Standard (2008-01-12 13:53:16)

## 2018-08-06 NOTE — Progress Notes (Signed)
Okay to treat with Crea. 1.6, per K. Curcio, NP

## 2018-08-06 NOTE — Assessment & Plan Note (Signed)
Annual exam as documented. Counseling done  re healthy lifestyle involving commitment to 150 minutes exercise per week, heart healthy diet, and attaining healthy weight.The importance of adequate sleep also discussed.  Immunization and cancer screening needs are specifically addressed at this visit.  

## 2018-08-06 NOTE — Progress Notes (Signed)
During Cytoxan infusion, pt noticed red rash on left arm. No itching, shortness of breath or other symptoms present. Infusion paused and Sandi Mealy, PA to bedside. Pepcid given (see MAR and flowsheets)

## 2018-08-06 NOTE — Progress Notes (Signed)
Went to treatment area to introduce myself to patient as Arboriculturist and to offer available resources.  Gave patient application for [name] application and advised she may return to them or bring back to me along with supporting documents to email to them. She verbalized understanding.  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 Advised if interested in applying, proof of household income is needed. She verbalized understanding. She has my card for any additional financial questions or concerns.

## 2018-08-06 NOTE — Progress Notes (Signed)
Patient Care Team: [name], MD as PCP - Bethena Roys, MD as Consulting Physician (Cardiology) Cottle, Billey Co., MD as Attending Physician (Psychiatry)  DIAGNOSIS:  Encounter Diagnosis  Name Primary?  . 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  Ultrasound reveals 0.9 cm, 1.3 cm, 1 cm no abnormal lymph nodes, right breast biopsy 10 o'clock position: IDC grade 3, ER 0%, PR 0%, Ki-67 60%, HER-2 negative ratio 1.74    06/09/2018 Surgery    Right mastectomy: Grade 3 IDC, 1.2 cm, high-grade DCIS, margins negative, negative for lymphovascular or perineural invasion, ER 0%, PR 0%, HER-2 negative, Ki-67 60%    07/16/2018 -  Chemotherapy    CMF adjuvant chemotherapy for 6 cycles      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 ([facility])    CHIEF COMPLIANT: Cycle 1 day 8 CMF  INTERVAL HISTORY: CODIE HAINER is a 72 year old with above-mentioned history of right breast cancer treated with right mastectomy and is now on adjuvant chemotherapy today cycle 2 day 1 of CMF.  She tolerated her first cycle well overall but did develop a mild rash to her left arm during the first infusion.  She was given Benadryl 50 mg IV and the rash resolved.  She had no difficulty with shortness of breath or chest discomfort during this infusion.  She has not had any nausea, vomiting,  diarrhea.  No recurrent rash.  REVIEW OF SYSTEMS:   Constitutional: Denies fevers, chills or abnormal weight loss Eyes: Denies blurriness of vision Ears, nose, mouth, throat, and face: Denies mucositis or sore throat Respiratory: Denies cough, dyspnea or wheezes Cardiovascular: Denies palpitation, chest discomfort Gastrointestinal:  Denies nausea, heartburn or change in bowel habits Skin: Denies abnormal skin rashes Lymphatics: Denies new lymphadenopathy or easy bruising Neurological:Denies numbness, tingling or new weaknesses Behavioral/Psych: Mood is stable, no new changes  Extremities: No lower extremity edema Breast:  denies any pain or lumps or nodules in either breasts All other systems were reviewed with the patient and are negative.  I have reviewed the past medical history, past surgical history, social history and family history with the patient and they are unchanged from previous note.  ALLERGIES:  is allergic to aspirin; lisinopril; sudafed  [pseudoephedrine hcl]; and sudafed [pseudoephedrine hcl].  MEDICATIONS:  Current Outpatient Medications  Medication Sig Dispense Refill  . acetaminophen (TYLENOL) 500 MG tablet Take 500-1,000 mg by mouth every 6 (six) hours as needed (FOR PAIN.).    Marland Kitchen aspirin EC 81 MG tablet Take 81 mg by mouth daily.     Marland Kitchen azelastine (ASTELIN) 0.1 % nasal spray Place 2 sprays into both nostrils 2 (two) times daily. Use in each nostril as directed (Patient taking differently: Place 2 sprays into both nostrils 2 (two) times daily as needed (cold symptoms). Use in each nostril as directed) 30 mL  12  . clonazePAM (KLONOPIN) 0.5 MG tablet TAKE (1) TABLET BY MOUTH AT BEDTIME. 90 tablet 0  . docusate sodium (COLACE) 100 MG capsule Take 200 mg by mouth 2 (two) times daily.     . folic acid (FOLVITE) 1 MG tablet TAKE ONE TABLET BY MOUTH DAILY. (Patient taking differently: Take 1 mg by mouth daily. ) 100 tablet 1  . lidocaine-prilocaine (EMLA) cream Apply to  affected area once 30 g 3  . lovastatin (MEVACOR) 40 MG tablet TAKE 1 TABLET BY MOUTH AT BEDTIME 90 tablet 2  . metoCLOPramide (REGLAN) 10 MG tablet TAKE ONE TABLET BY MOUTH TWICE DAILY. 180 tablet 0  . metoprolol succinate (TOPROL-XL) 50 MG 24 hr tablet TAKE 1 TABLET BY MOUTH IN THE MORNING AND 1/2 TABLET IN THE EVENING. 45 tablet 0  . montelukast (SINGULAIR) 10 MG tablet TAKE ONE TABLET BY MOUTH AT BEDTIME. (Patient taking differently: Take 10 mg by mouth at bedtime. ) 30 tablet 3  . pantoprazole (PROTONIX) 40 MG tablet TAKE (1) TABLET BY MOUTH TWICE DAILY. 180 tablet 0  . sacubitril-valsartan (ENTRESTO) 24-26 MG Take 1 tablet by mouth 2 (two) times daily. 30 tablet 2  . sertraline (ZOLOFT) 50 MG tablet TAKE 1 TABLET BY MOUTH ONCE A DAY. 90 tablet 0  . spironolactone (ALDACTONE) 25 MG tablet TAKE 1 TABLET BY MOUTH ONCE A DAY. (Patient taking differently: Take 25 mg by mouth daily. ) 30 tablet 12  . zolpidem (AMBIEN) 10 MG tablet TAKE 1 TABLET BY MOUTH AT BEDTIME AS NEEDED FOR SLEEP. 30 tablet 1  . HYDROcodone-acetaminophen (NORCO/VICODIN) 5-325 MG tablet Take 1-2 tablets by mouth every 6 (six) hours as needed for moderate pain. (Patient not taking: Reported on 08/06/2018) 20 tablet 0  . ondansetron (ZOFRAN) 8 MG tablet Take 1 tablet (8 mg total) by mouth 2 (two) times daily as needed for refractory nausea / vomiting. Start on day 3 after chemotherapy. (Patient not taking: Reported on 08/06/2018) 30 tablet 1  . prochlorperazine (COMPAZINE) 10 MG tablet Take 1 tablet (10 mg total) by mouth every 6 (six) hours as needed (Nausea or vomiting). (Patient not taking: Reported on 08/06/2018) 30 tablet 1   No current facility-administered medications for this visit.    Facility-Administered Medications Ordered in Other Visits  Medication Dose Route Frequency Provider Last Rate Last Dose  . cyclophosphamide (CYTOXAN) 1,120 mg in sodium chloride 0.9 % 250 mL chemo infusion  600 mg/m2 (Treatment Plan  Recorded) Intravenous Once Nicholas Lose, MD      . diphenhydrAMINE (BENADRYL) injection 25 mg  25 mg Intravenous Once Kerryann Allaire, Roselie Awkward, NP      . fluorouracil (ADRUCIL) chemo injection 1,150 mg  600 mg/m2 (Treatment Plan Recorded) Intravenous Once Nicholas Lose, MD      . heparin lock flush 100 unit/mL  500 Units Intracatheter Once PRN Nicholas Lose, MD      . methotrexate (PF) chemo injection 75 mg  39.9 mg/m2 (Treatment Plan Recorded) Intravenous Once Nicholas Lose, MD      . palonosetron (ALOXI) injection 0.25 mg  0.25 mg Intravenous Once Nicholas Lose, MD      . sodium chloride flush (NS) 0.9 % injection 10 mL  10 mL Intracatheter PRN Nicholas Lose, MD        PHYSICAL EXAMINATION: ECOG PERFORMANCE STATUS: 2 - Symptomatic, <50% confined to bed  Vitals:   08/06/18 1027  BP: 105/61  Pulse: 61  Resp: 18  Temp: 98.2 F (36.8 C)  SpO2: 96%  Filed Weights   08/06/18 1027  Weight: 190 lb 12.8 oz (86.5 kg)    GENERAL:alert, no distress and comfortable SKIN: skin color, texture, turgor are normal, no rashes or significant lesions EYES: normal, Conjunctiva are pink and non-injected, sclera clear OROPHARYNX:no exudate, no erythema and lips, buccal mucosa, and tongue normal  NECK: supple, thyroid normal size, non-tender, without nodularity LYMPH:  no palpable lymphadenopathy in the cervical, axillary or inguinal LUNGS: clear to auscultation and percussion with normal breathing effort HEART: regular rate & rhythm and no murmurs and no lower extremity edema ABDOMEN:abdomen soft, non-tender and normal bowel sounds MUSCULOSKELETAL:no cyanosis of digits and no clubbing  NEURO: alert & oriented x 3 with fluent speech, no focal motor/sensory deficits EXTREMITIES: No lower extremity edema   LABORATORY DATA:  I have reviewed the data as listed CMP Latest Ref Rng & Units 08/06/2018 07/22/2018 07/16/2018  Glucose 70 - 99 mg/dL 100(H) 105(H) 103(H)  BUN 8 - 23 mg/dL 26(H) 33(H) 37(H)    Creatinine 0.44 - 1.00 mg/dL 1.66(H) 1.46(H) 1.39(H)  Sodium 135 - 145 mmol/L 139 140 141  Potassium 3.5 - 5.1 mmol/L 5.0 4.4 4.3  Chloride 98 - 111 mmol/L 108 108 109  CO2 22 - 32 mmol/L 21(L) 23 21(L)  Calcium 8.9 - 10.3 mg/dL 8.6(L) 8.5(L) 9.0  Total Protein 6.5 - 8.1 g/dL 7.1 7.0 7.3  Total Bilirubin 0.3 - 1.2 mg/dL 0.3 0.3 0.3  Alkaline Phos 38 - 126 U/L 124 88 105  AST 15 - 41 U/L 13(L) 11(L) 14(L)  ALT 0 - 44 U/L _0 Lab Results  Component Value Date   WBC 4.8 08/06/2018   HGB 10.6 (L) 08/06/2018   HCT 33.9 (L) 08/06/2018   MCV 83.7 08/06/2018   PLT 228 08/06/2018   NEUTROABS 2.7 08/06/2018    ASSESSMENT & PLAN:  Malignant neoplasm of right breast ([place]) Original cancer diagnosed 2008 treated with lumpectomy, 2/8 lymph nodes positive, triple negative, adjuvant chemo FEC Taxotere, radiation  06/09/2018 right mastectomy: Grade 3 IDC, 1.2 cm, high-grade DCIS, margins negative, negative for lymphovascular or perineural invasion, ER 0%, PR 0%, HER-2 negative, Ki-67 60%  Current treatment: Adjuvant CMF chemotherapy started 07/16/2018.  Today is day 1 of cycle 2. Chemo toxicities: Patient does not have any nausea vomiting diarrhea or constipation.  She had a mild rash during her Cytoxan infusion with cycle 1.  Rash resolved with Benadryl.  -We will add Benadryl 25 mill grams IV as a premedication today. -We will slow infusion rate of Cytoxan down so that will infuse over 45 minutes. -Creatinine is stable at 1.6 and adequate to proceed with treatment today. -Follow-up in 3 weeks for evaluation prior to cycle #3.    No orders of the defined types were placed in this encounter.  The patient has a good understanding of the overall plan. she agrees with it. she will call with any problems that may develop before the next visit here.   Mikey Bussing, NP 08/06/18

## 2018-08-07 LAB — COMPLETE METABOLIC PANEL WITH GFR
AG Ratio: 1.2 (calc) (ref 1.0–2.5)
ALBUMIN MSPROF: 3.7 g/dL (ref 3.6–5.1)
ALT: 7 U/L (ref 6–29)
AST: 13 U/L (ref 10–35)
Alkaline phosphatase (APISO): 105 U/L (ref 33–130)
BILIRUBIN TOTAL: 0.2 mg/dL (ref 0.2–1.2)
BUN/Creatinine Ratio: 17 (calc) (ref 6–22)
BUN: 29 mg/dL — ABNORMAL HIGH (ref 7–25)
CHLORIDE: 105 mmol/L (ref 98–110)
CO2: 21 mmol/L (ref 20–32)
Calcium: 8.7 mg/dL (ref 8.6–10.4)
Creat: 1.68 mg/dL — ABNORMAL HIGH (ref 0.60–0.93)
GFR, EST AFRICAN AMERICAN: 35 mL/min/{1.73_m2} — AB (ref >=60)
GFR, EST NON AFRICAN AMERICAN: 30 mL/min/{1.73_m2} — AB (ref >=60)
Globulin: 3.1 g/dL (calc) (ref 1.9–3.7)
Glucose, Bld: 96 mg/dL (ref 65–99)
POTASSIUM: 5 mmol/L (ref 3.5–5.3)
SODIUM: 136 mmol/L (ref 135–146)
TOTAL PROTEIN: 6.8 g/dL (ref 6.1–8.1)

## 2018-08-07 LAB — LIPID PANEL
Cholesterol: 162 mg/dL (ref <200)
HDL: 43 mg/dL — AB (ref >50)
LDL Cholesterol (Calc): 101 mg/dL (calc) — ABNORMAL HIGH
NON-HDL CHOLESTEROL (CALC): 119 mg/dL (ref <130)
TRIGLYCERIDES: 86 mg/dL (ref <150)
Total CHOL/HDL Ratio: 3.8 (calc) (ref <5.0)

## 2018-08-07 LAB — CBC

## 2018-08-07 LAB — TSH: TSH: 5.63 mIU/L — ABNORMAL HIGH (ref 0.40–4.50)

## 2018-08-07 LAB — VITAMIN D 25 HYDROXY (VIT D DEFICIENCY, FRACTURES): VIT D 25 HYDROXY: 18 ng/mL — AB (ref 30–100)

## 2018-08-11 NOTE — Progress Notes (Signed)
    DATE:  08/06/2018                                           X CHEMO/IMMUNOTHERAPY REACTION              MD: Dr. Nicholas Lose   AGENT/BLOOD PRODUCT RECEIVING TODAY:              CMF   AGENT/BLOOD PRODUCT RECEIVING IMMEDIATELY PRIOR TO REACTION:          Cytoxan   REACTION(S):           Red non-pruritic rash over the arms    PREMEDS:     Benadryl and Aloxi    INTERVENTION: Pepcid 20 mg IV x 1    Review of Systems  Review of Systems  Constitutional: Negative for chills, diaphoresis and fever.  HENT: Negative for trouble swallowing and voice change.   Respiratory: Negative for cough, chest tightness, shortness of breath and wheezing.   Cardiovascular: Negative for chest pain and palpitations.  Gastrointestinal: Negative for abdominal pain, constipation, diarrhea, nausea and vomiting.  Musculoskeletal: Negative for back pain and myalgias.  Skin: Positive for rash (erythematous non-pruritic rash of the upper extremities.).  Neurological: Negative for dizziness, light-headedness and headaches.     Physical Exam  Physical Exam  Constitutional: No distress.  HENT:  Head: Normocephalic and atraumatic.  Cardiovascular: Normal rate, regular rhythm and normal heart sounds. Exam reveals no gallop and no friction rub.  No murmur heard. Pulmonary/Chest: Effort normal and breath sounds normal. No respiratory distress. She has no wheezes. She has no rales.  Neurological: She is alert.  Skin: Skin is warm and dry. Rash noted. She is not diaphoretic. There is erythema.  Diffuse erythematous rash over the bilateral upper extremities.    OUTCOME:    The rash abated after dosing with Pepcid. She was able to complete her therapy without additional issues of concern.               Sandi Mealy, MHS, PA-C

## 2018-08-12 ENCOUNTER — Telehealth: Payer: Self-pay

## 2018-08-12 NOTE — Telephone Encounter (Signed)
Patient left voicemail requesting call back regarding concerns with diarrhea.    Nurse returned call.  Patient with 1 loose stool today.  Reported 3 loose stools on 08/11/2018 - Patient tolerating fluids well.  Denies weakness or dizziness.    Nurse recommended OTC Imodium, along with fluids and bland diet.  Patient voiced understanding and agreement.  Nurse educated patient on s/s of dehydration and to contact office if no improvement with imodium.

## 2018-08-23 ENCOUNTER — Other Ambulatory Visit (HOSPITAL_COMMUNITY): Payer: Self-pay | Admitting: Cardiology

## 2018-08-23 ENCOUNTER — Other Ambulatory Visit: Payer: Self-pay | Admitting: Family Medicine

## 2018-08-27 ENCOUNTER — Telehealth: Payer: Self-pay

## 2018-08-27 ENCOUNTER — Telehealth: Payer: Self-pay | Admitting: Hematology and Oncology

## 2018-08-27 ENCOUNTER — Inpatient Hospital Stay (HOSPITAL_BASED_OUTPATIENT_CLINIC_OR_DEPARTMENT_OTHER): Payer: Medicare Other | Admitting: Hematology and Oncology

## 2018-08-27 ENCOUNTER — Inpatient Hospital Stay: Payer: Medicare Other

## 2018-08-27 ENCOUNTER — Inpatient Hospital Stay: Payer: Medicare Other | Attending: Hematology and Oncology

## 2018-08-27 DIAGNOSIS — Z171 Estrogen receptor negative status [ER-]: Secondary | ICD-10-CM

## 2018-08-27 DIAGNOSIS — Z923 Personal history of irradiation: Secondary | ICD-10-CM | POA: Diagnosis not present

## 2018-08-27 DIAGNOSIS — C773 Secondary and unspecified malignant neoplasm of axilla and upper limb lymph nodes: Secondary | ICD-10-CM | POA: Insufficient documentation

## 2018-08-27 DIAGNOSIS — Z9010 Acquired absence of unspecified breast and nipple: Secondary | ICD-10-CM | POA: Diagnosis not present

## 2018-08-27 DIAGNOSIS — C50811 Malignant neoplasm of overlapping sites of right female breast: Secondary | ICD-10-CM

## 2018-08-27 DIAGNOSIS — Z5111 Encounter for antineoplastic chemotherapy: Secondary | ICD-10-CM | POA: Diagnosis not present

## 2018-08-27 DIAGNOSIS — C50411 Malignant neoplasm of upper-outer quadrant of right female breast: Secondary | ICD-10-CM

## 2018-08-27 LAB — CBC WITH DIFFERENTIAL (CANCER CENTER ONLY)
Abs Immature Granulocytes: 0.03 10*3/uL (ref 0.00–0.07)
Basophils Absolute: 0 10*3/uL (ref 0.0–0.1)
Basophils Relative: 1 %
EOS ABS: 0.2 10*3/uL (ref 0.0–0.5)
Eosinophils Relative: 4 %
HCT: 30.3 % — ABNORMAL LOW (ref 36.0–46.0)
Hemoglobin: 9.7 g/dL — ABNORMAL LOW (ref 12.0–15.0)
Immature Granulocytes: 1 %
Lymphocytes Relative: 27 %
Lymphs Abs: 1.1 10*3/uL (ref 0.7–4.0)
MCH: 27.1 pg (ref 26.0–34.0)
MCHC: 32 g/dL (ref 30.0–36.0)
MCV: 84.6 fL (ref 80.0–100.0)
MONO ABS: 0.7 10*3/uL (ref 0.1–1.0)
Monocytes Relative: 17 %
Neutro Abs: 2 10*3/uL (ref 1.7–7.7)
Neutrophils Relative %: 50 %
Platelet Count: 212 10*3/uL (ref 150–400)
RBC: 3.58 MIL/uL — ABNORMAL LOW (ref 3.87–5.11)
RDW: 18.4 % — ABNORMAL HIGH (ref 11.5–15.5)
WBC Count: 4 10*3/uL (ref 4.0–10.5)
nRBC: 0 % (ref 0.0–0.2)

## 2018-08-27 LAB — CMP (CANCER CENTER ONLY)
ALT: 7 U/L (ref 0–44)
AST: 11 U/L — ABNORMAL LOW (ref 15–41)
Albumin: 3.3 g/dL — ABNORMAL LOW (ref 3.5–5.0)
Alkaline Phosphatase: 132 U/L — ABNORMAL HIGH (ref 38–126)
Anion gap: 11 (ref 5–15)
BUN: 20 mg/dL (ref 8–23)
CALCIUM: 8.4 mg/dL — AB (ref 8.9–10.3)
CO2: 20 mmol/L — ABNORMAL LOW (ref 22–32)
CREATININE: 1.55 mg/dL — AB (ref 0.44–1.00)
Chloride: 109 mmol/L (ref 98–111)
GFR, Est AFR Am: 38 mL/min — ABNORMAL LOW (ref >60)
GFR, Estimated: 33 mL/min — ABNORMAL LOW (ref >60)
Glucose, Bld: 110 mg/dL — ABNORMAL HIGH (ref 70–99)
Potassium: 4.7 mmol/L (ref 3.5–5.1)
Sodium: 140 mmol/L (ref 135–145)
Total Bilirubin: 0.2 mg/dL — ABNORMAL LOW (ref 0.3–1.2)
Total Protein: 6.9 g/dL (ref 6.5–8.1)

## 2018-08-27 MED ORDER — SODIUM CHLORIDE 0.9 % IV SOLN
600.0000 mg/m2 | Freq: Once | INTRAVENOUS | Status: AC
  Administered 2018-08-27: 1120 mg via INTRAVENOUS
  Filled 2018-08-27: qty 56

## 2018-08-27 MED ORDER — FLUOROURACIL CHEMO INJECTION 2.5 GM/50ML
600.0000 mg/m2 | Freq: Once | INTRAVENOUS | Status: AC
  Administered 2018-08-27: 1150 mg via INTRAVENOUS
  Filled 2018-08-27: qty 23

## 2018-08-27 MED ORDER — SODIUM CHLORIDE 0.9% FLUSH
10.0000 mL | INTRAVENOUS | Status: DC | PRN
  Filled 2018-08-27: qty 10

## 2018-08-27 MED ORDER — SODIUM CHLORIDE 0.9 % IV SOLN
Freq: Once | INTRAVENOUS | Status: AC
  Administered 2018-08-27: 10:00:00 via INTRAVENOUS
  Filled 2018-08-27: qty 250

## 2018-08-27 MED ORDER — FAMOTIDINE IN NACL 20-0.9 MG/50ML-% IV SOLN
20.0000 mg | Freq: Once | INTRAVENOUS | Status: AC
  Administered 2018-08-27: 20 mg via INTRAVENOUS

## 2018-08-27 MED ORDER — PALONOSETRON HCL INJECTION 0.25 MG/5ML
INTRAVENOUS | Status: AC
  Filled 2018-08-27: qty 5

## 2018-08-27 MED ORDER — PALONOSETRON HCL INJECTION 0.25 MG/5ML
0.2500 mg | Freq: Once | INTRAVENOUS | Status: AC
  Administered 2018-08-27: 0.25 mg via INTRAVENOUS

## 2018-08-27 MED ORDER — HEPARIN SOD (PORK) LOCK FLUSH 100 UNIT/ML IV SOLN
500.0000 [IU] | Freq: Once | INTRAVENOUS | Status: AC | PRN
  Administered 2018-08-27: 500 [IU]
  Filled 2018-08-27: qty 5

## 2018-08-27 MED ORDER — DIPHENHYDRAMINE HCL 50 MG/ML IJ SOLN
25.0000 mg | Freq: Once | INTRAMUSCULAR | Status: AC
  Administered 2018-08-27: 25 mg via INTRAVENOUS

## 2018-08-27 MED ORDER — FAMOTIDINE IN NACL 20-0.9 MG/50ML-% IV SOLN
INTRAVENOUS | Status: AC
  Filled 2018-08-27: qty 50

## 2018-08-27 MED ORDER — METHOTREXATE SODIUM (PF) CHEMO INJECTION 250 MG/10ML
39.9000 mg/m2 | Freq: Once | INTRAMUSCULAR | Status: AC
  Administered 2018-08-27: 75 mg via INTRAVENOUS
  Filled 2018-08-27: qty 3

## 2018-08-27 MED ORDER — DIPHENHYDRAMINE HCL 50 MG/ML IJ SOLN
INTRAMUSCULAR | Status: AC
  Filled 2018-08-27: qty 1

## 2018-08-27 NOTE — Progress Notes (Signed)
okay to treat with Creat. 1.55, per Dr. Lindi Adie.

## 2018-08-27 NOTE — Progress Notes (Signed)
Per Dr.Gudena, okay to treat cr 1.55.

## 2018-08-27 NOTE — Patient Instructions (Signed)
[facility] Discharge Instructions for Patients Receiving Chemotherapy  Today you received the following chemotherapy agents: Cytoxan, Methotrexate, & 5FU.  To help prevent nausea and vomiting after your treatment, we encourage you to take your nausea medication as prescribed. If you develop nausea and vomiting that is not controlled by your nausea medication, call the clinic.   BELOW ARE SYMPTOMS THAT SHOULD BE REPORTED IMMEDIATELY:  *FEVER GREATER THAN 100.5 F  *CHILLS WITH OR WITHOUT FEVER  NAUSEA AND VOMITING THAT IS NOT CONTROLLED WITH YOUR NAUSEA MEDICATION  *UNUSUAL SHORTNESS OF BREATH  *UNUSUAL BRUISING OR BLEEDING  TENDERNESS IN MOUTH AND THROAT WITH OR WITHOUT PRESENCE OF ULCERS  *URINARY PROBLEMS  *BOWEL PROBLEMS  UNUSUAL RASH Items with * indicate a potential emergency and should be followed up as soon as possible.  Feel free to call the clinic should you have any questions or concerns. The clinic phone number is [phone].  Please show the [facility] at check-in to the Emergency Department and triage nurse.   Cyclophosphamide injection What is this medicine? CYCLOPHOSPHAMIDE (sye kloe FOSS fa mide) is a chemotherapy drug. It slows the growth of cancer cells. This medicine is used to treat many types of cancer like lymphoma, myeloma, leukemia, breast cancer, and ovarian cancer, to name a few. This medicine may be used for other purposes; ask your health care provider or pharmacist if you have questions. COMMON BRAND NAME(S): Cytoxan, Neosar What should I tell my health care provider before I take this medicine? They need to know if you have any of these conditions: -blood disorders -history of other chemotherapy -infection -kidney disease -liver disease -recent or ongoing radiation therapy -tumors in the bone marrow -an unusual or allergic reaction to cyclophosphamide, other chemotherapy, other medicines, foods, dyes, or  preservatives -pregnant or trying to get pregnant -breast-feeding How should I use this medicine? This drug is usually given as an injection into a vein or muscle or by infusion into a vein. It is administered in a hospital or clinic by a specially trained health care professional. Talk to your pediatrician regarding the use of this medicine in children. Special care may be needed. Overdosage: If you think you have taken too much of this medicine contact a poison control center or emergency room at once. NOTE: This medicine is only for you. Do not share this medicine with others. What if I miss a dose? It is important not to miss your dose. Call your doctor or health care professional if you are unable to keep an appointment. What may interact with this medicine? This medicine may interact with the following medications: -amiodarone -amphotericin B -azathioprine -certain antiviral medicines for HIV or AIDS such as protease inhibitors (e.g., indinavir, ritonavir) and zidovudine -certain blood pressure medications such as benazepril, captopril, enalapril, fosinopril, lisinopril, moexipril, monopril, perindopril, quinapril, ramipril, trandolapril -certain cancer medications such as anthracyclines (e.g., daunorubicin, doxorubicin), busulfan, cytarabine, paclitaxel, pentostatin, tamoxifen, trastuzumab -certain diuretics such as chlorothiazide, chlorthalidone, hydrochlorothiazide, indapamide, metolazone -certain medicines that treat or prevent blood clots like warfarin -certain muscle relaxants such as succinylcholine -cyclosporine -etanercept -indomethacin -medicines to increase blood counts like filgrastim, pegfilgrastim, sargramostim -medicines used as general anesthesia -metronidazole -natalizumab This list may not describe all possible interactions. Give your health care provider a list of all the medicines, herbs, non-prescription drugs, or dietary supplements you use. Also tell them if  you smoke, drink alcohol, or use illegal drugs. Some items may interact with your medicine. What should I watch  for while using this medicine? Visit your doctor for checks on your progress. This drug may make you feel generally unwell. This is not uncommon, as chemotherapy can affect healthy cells as well as cancer cells. Report any side effects. Continue your course of treatment even though you feel ill unless your doctor tells you to stop. Drink water or other fluids as directed. Urinate often, even at night. In some cases, you may be given additional medicines to help with side effects. Follow all directions for their use. Call your doctor or health care professional for advice if you get a fever, chills or sore throat, or other symptoms of a cold or flu. Do not treat yourself. This drug decreases your body's ability to fight infections. Try to avoid being around people who are sick. This medicine may increase your risk to bruise or bleed. Call your doctor or health care professional if you notice any unusual bleeding. Be careful brushing and flossing your teeth or using a toothpick because you may get an infection or bleed more easily. If you have any dental work done, tell your dentist you are receiving this medicine. You may get drowsy or dizzy. Do not drive, use machinery, or do anything that needs mental alertness until you know how this medicine affects you. Do not become pregnant while taking this medicine or for 1 year after stopping it. Women should inform their doctor if they wish to become pregnant or think they might be pregnant. Men should not father a child while taking this medicine and for 4 months after stopping it. There is a potential for serious side effects to an unborn child. Talk to your health care professional or pharmacist for more information. Do not breast-feed an infant while taking this medicine. This medicine may interfere with the ability to have a child. This medicine  has caused ovarian failure in some women. This medicine has caused reduced sperm counts in some men. You should talk with your doctor or health care professional if you are concerned about your fertility. If you are going to have surgery, tell your doctor or health care professional that you have taken this medicine. What side effects may I notice from receiving this medicine? Side effects that you should report to your doctor or health care professional as soon as possible: -allergic reactions like skin rash, itching or hives, swelling of the face, lips, or tongue -low blood counts - this medicine may decrease the number of white blood cells, red blood cells and platelets. You may be at increased risk for infections and bleeding. -signs of infection - fever or chills, cough, sore throat, pain or difficulty passing urine -signs of decreased platelets or bleeding - bruising, pinpoint red spots on the skin, black, tarry stools, blood in the urine -signs of decreased red blood cells - unusually weak or tired, fainting spells, lightheadedness -breathing problems -dark urine -dizziness -palpitations -swelling of the ankles, feet, hands -trouble passing urine or change in the amount of urine -weight gain -yellowing of the eyes or skin Side effects that usually do not require medical attention (report to your doctor or health care professional if they continue or are bothersome): -changes in nail or skin color -hair loss -missed menstrual periods -mouth sores -nausea, vomiting This list may not describe all possible side effects. Call your doctor for medical advice about side effects. You may report side effects to FDA at 1-800-FDA-1088. Where should I keep my medicine? This drug is given in a hospital  or clinic and will not be stored at home. NOTE: This sheet is a summary. It may not cover all possible information. If you have questions about this medicine, talk to your doctor, pharmacist, or  health care provider.  2018 [employer] Standard (2012-07-23 16:22:58)   Methotrexate injection What is this medicine? METHOTREXATE (METH oh TREX ate) is a chemotherapy drug used to treat cancer including breast cancer, leukemia, and lymphoma. This medicine can also be used to treat psoriasis and certain kinds of arthritis. This medicine may be used for other purposes; ask your health care provider or pharmacist if you have questions. What should I tell my health care provider before I take this medicine? They need to know if you have any of these conditions: -fluid in the stomach area or lungs -if you often drink alcohol -infection or immune system problems -kidney disease -liver disease -low blood counts, like low white cell, platelet, or red cell counts -lung disease -radiation therapy -stomach ulcers -ulcerative colitis -an unusual or allergic reaction to methotrexate, other medicines, foods, dyes, or preservatives -pregnant or trying to get pregnant -breast-feeding How should I use this medicine? This medicine is for infusion into a vein or for injection into muscle or into the spinal fluid (whichever applies). It is usually given by a health care professional in a hospital or clinic setting. In rare cases, you might get this medicine at home. You will be taught how to give this medicine. Use exactly as directed. Take your medicine at regular intervals. Do not take your medicine more often than directed. If this medicine is used for arthritis or psoriasis, it should be taken weekly, NOT daily. It is important that you put your used needles and syringes in a special sharps container. Do not put them in a trash can. If you do not have a sharps container, call your pharmacist or healthcare provider to get one. Talk to your pediatrician regarding the use of this medicine in children. While this drug may be prescribed for children as young as 2 years for selected conditions,  precautions do apply. Overdosage: If you think you have taken too much of this medicine contact a poison control center or emergency room at once. NOTE: This medicine is only for you. Do not share this medicine with others. What if I miss a dose? It is important not to miss your dose. Call your doctor or health care professional if you are unable to keep an appointment. If you give yourself the medicine and you miss a dose, talk with your doctor or health care professional. Do not take double or extra doses. What may interact with this medicine? This medicine may interact with the following medications: -acitretin -aspirin or aspirin-like medicines including salicylates -azathioprine -certain antibiotics like chloramphenicol, penicillin, tetracycline -certain medicines for stomach problems like esomeprazole, omeprazole, pantoprazole -cyclosporine -gold -hydroxychloroquine -live virus vaccines -mercaptopurine -NSAIDs, medicines for pain and inflammation, like ibuprofen or naproxen -other cytotoxic agents -penicillamine -phenylbutazone -phenytoin -probenacid -retinoids such as isotretinoin and tretinoin -steroid medicines like prednisone or cortisone -sulfonamides like sulfasalazine and trimethoprim/sulfamethoxazole -theophylline This list may not describe all possible interactions. Give your health care provider a list of all the medicines, herbs, non-prescription drugs, or dietary supplements you use. Also tell them if you smoke, drink alcohol, or use illegal drugs. Some items may interact with your medicine. What should I watch for while using this medicine? Avoid alcoholic drinks. In some cases, you may be given additional medicines to help with side effects.  Follow all directions for their use. This medicine can make you more sensitive to the sun. Keep out of the sun. If you cannot avoid being in the sun, wear protective clothing and use sunscreen. Do not use sun lamps or tanning  beds/booths. You may get drowsy or dizzy. Do not drive, use machinery, or do anything that needs mental alertness until you know how this medicine affects you. Do not stand or sit up quickly, especially if you are an older patient. This reduces the risk of dizzy or fainting spells. You may need blood work done while you are taking this medicine. Call your doctor or health care professional for advice if you get a fever, chills or sore throat, or other symptoms of a cold or flu. Do not treat yourself. This drug decreases your body's ability to fight infections. Try to avoid being around people who are sick. This medicine may increase your risk to bruise or bleed. Call your doctor or health care professional if you notice any unusual bleeding. Check with your doctor or health care professional if you get an attack of severe diarrhea, nausea and vomiting, or if you sweat a lot. The loss of too much body fluid can make it dangerous for you to take this medicine. Talk to your doctor about your risk of cancer. You may be more at risk for certain types of cancers if you take this medicine. Both men and women must use effective birth control with this medicine. Do not become pregnant while taking this medicine or until at least 1 normal menstrual cycle has occurred after stopping it. Women should inform their doctor if they wish to become pregnant or think they might be pregnant. Men should not father a child while taking this medicine and for 3 months after stopping it. There is a potential for serious side effects to an unborn child. Talk to your health care professional or pharmacist for more information. Do not breast-feed an infant while taking this medicine. What side effects may I notice from receiving this medicine? Side effects that you should report to your doctor or health care professional as soon as possible: -allergic reactions like skin rash, itching or hives, swelling of the face, lips, or  tongue -back pain -breathing problems or shortness of breath -confusion -diarrhea -dry, nonproductive cough -low blood counts - this medicine may decrease the number of white blood cells, red blood cells and platelets. You may be at increased risk of infections and bleeding -mouth sores -redness, blistering, peeling or loosening of the skin, including inside the mouth -seizures -severe headaches -signs of infection - fever or chills, cough, sore throat, pain or difficulty passing urine -signs and symptoms of bleeding such as bloody or black, tarry stools; red or dark-brown urine; spitting up blood or brown material that looks like coffee grounds; red spots on the skin; unusual bruising or bleeding from the eye, gums, or nose -signs and symptoms of kidney injury like trouble passing urine or change in the amount of urine -signs and symptoms of liver injury like dark yellow or brown urine; general ill feeling or flu-like symptoms; light-colored stools; loss of appetite; nausea; right upper belly pain; unusually weak or tired; yellowing of the eyes or skin -stiff neck -vomiting Side effects that usually do not require medical attention (report to your doctor or health care professional if they continue or are bothersome): -dizziness -hair loss -headache -stomach pain -upset stomach This list may not describe all possible side effects. Call  your doctor for medical advice about side effects. You may report side effects to FDA at 1-800-FDA-1088. Where should I keep my medicine? If you are using this medicine at home, you will be instructed on how to store this medicine. Throw away any unused medicine after the expiration date on the label. NOTE: This sheet is a summary. It may not cover all possible information. If you have questions about this medicine, talk to your doctor, pharmacist, or health care provider.  2018 [employer]/Gold Standard (2014-12-28 12:36:41)  Fluorouracil, 5-FU  injection What is this medicine? FLUOROURACIL, 5-FU (flure oh YOOR a sil) is a chemotherapy drug. It slows the growth of cancer cells. This medicine is used to treat many types of cancer like breast cancer, colon or rectal cancer, pancreatic cancer, and stomach cancer. This medicine may be used for other purposes; ask your health care provider or pharmacist if you have questions. COMMON BRAND NAME(S): Adrucil What should I tell my health care provider before I take this medicine? They need to know if you have any of these conditions: -blood disorders -dihydropyrimidine dehydrogenase (DPD) deficiency -infection (especially a virus infection such as chickenpox, cold sores, or herpes) -kidney disease -liver disease -malnourished, poor nutrition -recent or ongoing radiation therapy -an unusual or allergic reaction to fluorouracil, other chemotherapy, other medicines, foods, dyes, or preservatives -pregnant or trying to get pregnant -breast-feeding How should I use this medicine? This drug is given as an infusion or injection into a vein. It is administered in a hospital or clinic by a specially trained health care professional. Talk to your pediatrician regarding the use of this medicine in children. Special care may be needed. Overdosage: If you think you have taken too much of this medicine contact a poison control center or emergency room at once. NOTE: This medicine is only for you. Do not share this medicine with others. What if I miss a dose? It is important not to miss your dose. Call your doctor or health care professional if you are unable to keep an appointment. What may interact with this medicine? -allopurinol -cimetidine -dapsone -digoxin -hydroxyurea -leucovorin -levamisole -medicines for seizures like ethotoin, fosphenytoin, phenytoin -medicines to increase blood counts like filgrastim, pegfilgrastim, sargramostim -medicines that treat or prevent blood clots like  warfarin, enoxaparin, and dalteparin -methotrexate -metronidazole -pyrimethamine -some other chemotherapy drugs like busulfan, cisplatin, estramustine, vinblastine -trimethoprim -trimetrexate -vaccines Talk to your doctor or health care professional before taking any of these medicines: -acetaminophen -aspirin -ibuprofen -ketoprofen -naproxen This list may not describe all possible interactions. Give your health care provider a list of all the medicines, herbs, non-prescription drugs, or dietary supplements you use. Also tell them if you smoke, drink alcohol, or use illegal drugs. Some items may interact with your medicine. What should I watch for while using this medicine? Visit your doctor for checks on your progress. This drug may make you feel generally unwell. This is not uncommon, as chemotherapy can affect healthy cells as well as cancer cells. Report any side effects. Continue your course of treatment even though you feel ill unless your doctor tells you to stop. In some cases, you may be given additional medicines to help with side effects. Follow all directions for their use. Call your doctor or health care professional for advice if you get a fever, chills or sore throat, or other symptoms of a cold or flu. Do not treat yourself. This drug decreases your body's ability to fight infections. Try to avoid being around people who  are sick. This medicine may increase your risk to bruise or bleed. Call your doctor or health care professional if you notice any unusual bleeding. Be careful brushing and flossing your teeth or using a toothpick because you may get an infection or bleed more easily. If you have any dental work done, tell your dentist you are receiving this medicine. Avoid taking products that contain aspirin, acetaminophen, ibuprofen, naproxen, or ketoprofen unless instructed by your doctor. These medicines may hide a fever. Do not become pregnant while taking this medicine.  Women should inform their doctor if they wish to become pregnant or think they might be pregnant. There is a potential for serious side effects to an unborn child. Talk to your health care professional or pharmacist for more information. Do not breast-feed an infant while taking this medicine. Men should inform their doctor if they wish to father a child. This medicine may lower sperm counts. Do not treat diarrhea with over the counter products. Contact your doctor if you have diarrhea that lasts more than 2 days or if it is severe and watery. This medicine can make you more sensitive to the sun. Keep out of the sun. If you cannot avoid being in the sun, wear protective clothing and use sunscreen. Do not use sun lamps or tanning beds/booths. What side effects may I notice from receiving this medicine? Side effects that you should report to your doctor or health care professional as soon as possible: -allergic reactions like skin rash, itching or hives, swelling of the face, lips, or tongue -low blood counts - this medicine may decrease the number of white blood cells, red blood cells and platelets. You may be at increased risk for infections and bleeding. -signs of infection - fever or chills, cough, sore throat, pain or difficulty passing urine -signs of decreased platelets or bleeding - bruising, pinpoint red spots on the skin, black, tarry stools, blood in the urine -signs of decreased red blood cells - unusually weak or tired, fainting spells, lightheadedness -breathing problems -changes in vision -chest pain -mouth sores -nausea and vomiting -pain, swelling, redness at site where injected -pain, tingling, numbness in the hands or feet -redness, swelling, or sores on hands or feet -stomach pain -unusual bleeding Side effects that usually do not require medical attention (report to your doctor or health care professional if they continue or are bothersome): -changes in finger or toe  nails -diarrhea -dry or itchy skin -hair loss -headache -loss of appetite -sensitivity of eyes to the light -stomach upset -unusually teary eyes This list may not describe all possible side effects. Call your doctor for medical advice about side effects. You may report side effects to FDA at 1-800-FDA-1088. Where should I keep my medicine? This drug is given in a hospital or clinic and will not be stored at home. NOTE: This sheet is a summary. It may not cover all possible information. If you have questions about this medicine, talk to your doctor, pharmacist, or health care provider.  2018 [employer]/Gold Standard (2008-01-12 13:53:16)

## 2018-08-27 NOTE — Telephone Encounter (Signed)
No 12/6 los

## 2018-08-27 NOTE — Progress Notes (Signed)
Patient Care Team: [name], MD as PCP - Bethena Roys, MD as Consulting Physician (Cardiology) Cottle, Billey Co., MD as Attending Physician (Psychiatry)  DIAGNOSIS:  Encounter Diagnosis  Name Primary?  . Malignant neoplasm of upper-outer quadrant of right breast in female, estrogen receptor negative ([place])     SUMMARY OF ONCOLOGIC HISTORY:   Malignant neoplasm of right breast ([place])   02/03/2007 Surgery    Right breast lumpectomy: Invasive ductal carcinoma 1.5 cm grade 3, 1/2 sentinel nodes positive, ER negative, PR negative, Ki-67 35%, HER-2 negative    03/08/2007 Surgery    Right axillary lymph node dissection: 2/8 lymph nodes positive    04/12/2007 - 08/13/2007 Chemotherapy    FEC and Taxotere    09/20/2007 - 11/15/2007 Radiation Therapy    Adjuvant radiation    04/01/2018 Relapse/Recurrence    3 irregular masses right breast UOQ.  Ultrasound reveals 0.9 cm, 1.3 cm, 1 cm no abnormal lymph nodes, right breast biopsy 10 o'clock position: IDC grade 3, ER 0%, PR 0%, Ki-67 60%, HER-2 negative ratio 1.74    06/09/2018 Surgery    Right mastectomy: Grade 3 IDC, 1.2 cm, high-grade DCIS, margins negative, negative for lymphovascular or perineural invasion, ER 0%, PR 0%, HER-2 negative, Ki-67 60%    07/16/2018 -  Chemotherapy    CMF adjuvant chemotherapy for 6 cycles      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 ([place])    CHIEF COMPLIANT: Cycle 3 CMF  INTERVAL HISTORY: SHEQUILA NEGLIA is a 72 year old with above-mentioned history of right breast cancer treated with mastectomy and is now on adjuvant chemotherapy with CMF.  Today is cycle 3.  She is tolerating CMF extremely well.  She is not having nausea vomiting.  She had diarrhea that lasted 3 to 4 days.  She had 3 episodes of loose stools per day.  She did not have Imodium initially and when she started taking Imodium the diarrhea subsided.  REVIEW OF SYSTEMS:   Constitutional: Denies fevers, chills  or abnormal weight loss Eyes: Denies blurriness of vision Ears, nose, mouth, throat, and face: Denies mucositis or sore throat Respiratory: Denies cough, dyspnea or wheezes Cardiovascular: Diarrhea Gastrointestinal:  Denies nausea, heartburn or change in bowel habits Skin: Denies abnormal skin rashes Lymphatics: Denies new lymphadenopathy or easy bruising Neurological:Denies numbness, tingling or new weaknesses Behavioral/Psych: Mood is stable, no new changes  Extremities: No lower extremity edema   All other systems were reviewed with the patient and are negative.  I have reviewed the past medical history, past surgical history, social history and family history with the patient and they are unchanged from previous note.  ALLERGIES:  is allergic to aspirin; lisinopril; sudafed  [pseudoephedrine hcl]; and sudafed [pseudoephedrine hcl].  MEDICATIONS:  Current Outpatient Medications  Medication Sig Dispense Refill  . acetaminophen (TYLENOL) 500 MG tablet Take 500-1,000 mg by mouth every 6 (six) hours as needed (FOR PAIN.).    Marland Kitchen aspirin EC 81 MG tablet Take 81 mg by mouth daily.     Marland Kitchen azelastine (ASTELIN) 0.1 % nasal spray Place 2 sprays into both nostrils 2 (two) times daily. Use in each nostril as directed (Patient taking differently: Place 2 sprays into both nostrils 2 (two) times daily as needed (cold symptoms). Use in each nostril as directed) 30 mL 12  . clonazePAM (KLONOPIN) 0.5 MG tablet TAKE (1) TABLET BY MOUTH AT BEDTIME. 90 tablet 0  . docusate sodium (COLACE) 100 MG capsule Take 200  mg by mouth 2 (two) times daily.     . folic acid (FOLVITE) 1 MG tablet TAKE ONE TABLET BY MOUTH DAILY. (Patient taking differently: Take 1 mg by mouth daily. ) 100 tablet 1  . HYDROcodone-acetaminophen (NORCO/VICODIN) 5-325 MG tablet Take 1-2 tablets by mouth every 6 (six) hours as needed for moderate pain. (Patient not taking: Reported on 08/06/2018) 20 tablet 0  . lidocaine-prilocaine (EMLA) cream  Apply to affected area once 30 g 3  . lovastatin (MEVACOR) 40 MG tablet TAKE 1 TABLET BY MOUTH AT BEDTIME 90 tablet 2  . metoCLOPramide (REGLAN) 10 MG tablet TAKE ONE TABLET BY MOUTH TWICE DAILY. 180 tablet 0  . metoprolol succinate (TOPROL-XL) 50 MG 24 hr tablet TAKE 1 TABLET BY MOUTH IN THE MORNING AND 1/2 TABLET IN THE EVENING. 45 tablet 0  . montelukast (SINGULAIR) 10 MG tablet TAKE ONE TABLET BY MOUTH AT BEDTIME. (Patient taking differently: Take 10 mg by mouth at bedtime. ) 30 tablet 3  . ondansetron (ZOFRAN) 8 MG tablet Take 1 tablet (8 mg total) by mouth 2 (two) times daily as needed for refractory nausea / vomiting. Start on day 3 after chemotherapy. (Patient not taking: Reported on 08/06/2018) 30 tablet 1  . pantoprazole (PROTONIX) 40 MG tablet TAKE (1) TABLET BY MOUTH TWICE DAILY. 180 tablet 0  . prochlorperazine (COMPAZINE) 10 MG tablet Take 1 tablet (10 mg total) by mouth every 6 (six) hours as needed (Nausea or vomiting). (Patient not taking: Reported on 08/06/2018) 30 tablet 1  . sacubitril-valsartan (ENTRESTO) 24-26 MG Take 1 tablet by mouth twice daily. NEED APPOINTMENT 60 tablet 0  . sertraline (ZOLOFT) 50 MG tablet TAKE 1 TABLET BY MOUTH ONCE A DAY. 90 tablet 0  . spironolactone (ALDACTONE) 25 MG tablet TAKE 1 TABLET BY MOUTH ONCE A DAY. (Patient taking differently: Take 25 mg by mouth daily. ) 30 tablet 12  . zolpidem (AMBIEN) 10 MG tablet TAKE 1 TABLET BY MOUTH AT BEDTIME AS NEEDED FOR SLEEP. 30 tablet 1   No current facility-administered medications for this visit.     PHYSICAL EXAMINATION: ECOG PERFORMANCE STATUS: 1 - Symptomatic but completely ambulatory  Vitals:   08/27/18 0950  BP: (!) 112/57  Pulse: 65  Resp: 17  Temp: 98 F (36.7 C)  SpO2: 98%   Filed Weights   08/27/18 0950  Weight: 189 lb 11.2 oz (86 kg)    GENERAL:alert, no distress and comfortable SKIN: skin color, texture, turgor are normal, no rashes or significant lesions EYES: normal, Conjunctiva  are pink and non-injected, sclera clear OROPHARYNX:no exudate, no erythema and lips, buccal mucosa, and tongue normal  NECK: supple, thyroid normal size, non-tender, without nodularity LYMPH:  no palpable lymphadenopathy in the cervical, axillary or inguinal LUNGS: clear to auscultation and percussion with normal breathing effort HEART: regular rate & rhythm and no murmurs and no lower extremity edema ABDOMEN:abdomen soft, non-tender and normal bowel sounds MUSCULOSKELETAL:no cyanosis of digits and no clubbing  NEURO: alert & oriented x 3 with fluent speech, no focal motor/sensory deficits EXTREMITIES: No lower extremity edema  LABORATORY DATA:  I have reviewed the data as listed CMP Latest Ref Rng & Units 08/06/2018 08/05/2018 07/22/2018  Glucose 70 - 99 mg/dL 100(H) 96 105(H)  BUN 8 - 23 mg/dL 26(H) 29(H) 33(H)  Creatinine 0.44 - 1.00 mg/dL 1.66(H) 1.68(H) 1.46(H)  Sodium 135 - 145 mmol/L 139 136 140  Potassium 3.5 - 5.1 mmol/L 5.0 5.0 4.4  Chloride 98 - 111 mmol/L 108  105 108  CO2 22 - 32 mmol/L 21(L) 21 23  Calcium 8.9 - 10.3 mg/dL 8.6(L) 8.7 8.5(L)  Total Protein 6.5 - 8.1 g/dL 7.1 6.8 7.0  Total Bilirubin 0.3 - 1.2 mg/dL 0.3 0.2 0.3  Alkaline Phos 38 - 126 U/L 124 - 88  AST 15 - 41 U/L 13(L) 13 11(L)  ALT 0 - 44 U/L '9 7 6    '$ Lab Results  Component Value Date   WBC 4.0 08/27/2018   HGB 9.7 (L) 08/27/2018   HCT 30.3 (L) 08/27/2018   MCV 84.6 08/27/2018   PLT 212 08/27/2018   NEUTROABS 2.0 08/27/2018    ASSESSMENT & PLAN:  Malignant neoplasm of right breast ([place]) Original cancer diagnosed 2008 treated with lumpectomy, 2/8 lymph nodes positive, triple negative, adjuvant chemo FEC Taxotere, radiation  06/09/2018 right mastectomy: Grade 3 IDC, 1.2 cm, high-grade DCIS, margins negative, negative for lymphovascular or perineural invasion, ER 0%, PR 0%, HER-2 negative, Ki-67 60%  Current treatment: Adjuvant CMF chemotherapy started 07/16/2018, today's cycle 3 day  1  Chemo toxicities: Patient does not have any nausea vomiting   Diarrhea: I encouraged her to take Imodium the first sign of diarrhea. Chemotherapy-induced anemia  Monitoring closely for toxicities Return to clinic in 3 weeks for cycle 4 CMF.    No orders of the defined types were placed in this encounter.  The patient has a good understanding of the overall plan. she agrees with it. she will call with any problems that may develop before the next visit here.   Harriette Ohara, MD 08/27/18

## 2018-08-27 NOTE — Assessment & Plan Note (Signed)
Original cancer diagnosed 2008 treated with lumpectomy, 2/8 lymph nodes positive, triple negative, adjuvant chemo FEC Taxotere, radiation  06/09/2018 right mastectomy: Grade 3 IDC, 1.2 cm, high-grade DCIS, margins negative, negative for lymphovascular or perineural invasion, ER 0%, PR 0%, HER-2 negative, Ki-67 60%  Current treatment: Adjuvant CMF chemotherapy started 07/16/2018, today's cycle 3 day 1  Chemo toxicities: Patient does not have any nausea vomiting diarrhea or constipation.  Return to clinic in 3 weeks for cycle 4 CMF.

## 2018-09-01 ENCOUNTER — Telehealth: Payer: Self-pay | Admitting: Hematology and Oncology

## 2018-09-01 ENCOUNTER — Telehealth: Payer: Self-pay

## 2018-09-01 NOTE — Telephone Encounter (Signed)
Called pt to address concerns about her next chemo appt. Told pt that it has not been set yet, but will send a message to scheduling for the end of December. Pt verbalized understanding. Also, pt wanted to report that she has had a reaction after each previous infusion. Pt states that she would break out in welts. Pt was given benadryl last week during infusion and broke out the next day. Pt took benadryl po and was okay afterwards. Pt did not report this issue until today.   Sent message to scheduling to make sure pt sees Dr.Gudena on her next scheduled CMF cycle 4. Pt should receive a confirmation phone call this week. Pt verbalized understanding and has no further questions at this time.

## 2018-09-01 NOTE — Telephone Encounter (Signed)
Scheduled appt per 12/11 sch message - pt is aware of appt date and time - per patient request appt be in January -

## 2018-09-07 ENCOUNTER — Other Ambulatory Visit: Payer: Self-pay | Admitting: Family Medicine

## 2018-09-11 IMAGING — CT CT HEAD W/O CM
4 series · 16 of 47 positions shown, 18 images · non-contrast
Comparison: Brain MRI March 05, 2016

CLINICAL DATA: Slurred speech. History of meningioma of the planum
sphenoidale region.

EXAM:
CT HEAD WITHOUT CONTRAST
TECHNIQUE: Contiguous axial images were obtained from the base of the skull
through the vertex without intravenous contrast.

[Series 2: head without · axial · non-contrast · 0.39mm/px · z∈[+1174,+1288]mm · 7 of 31 slices shown, 9 images]
[im 4/31  brain]
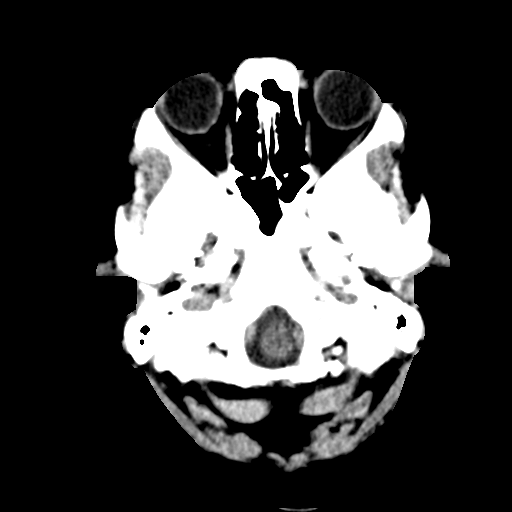
[im 4/31  bone]
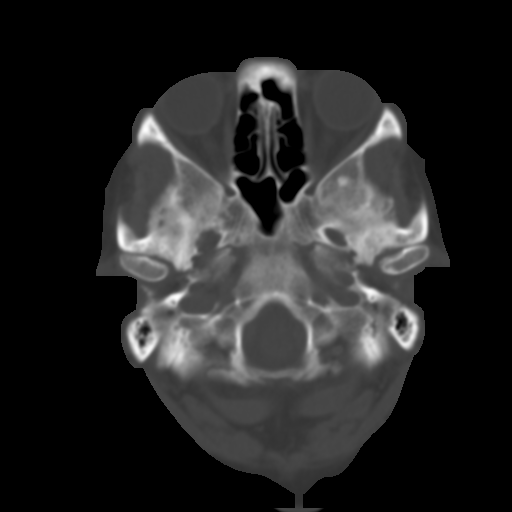
[im 8/31  brain]
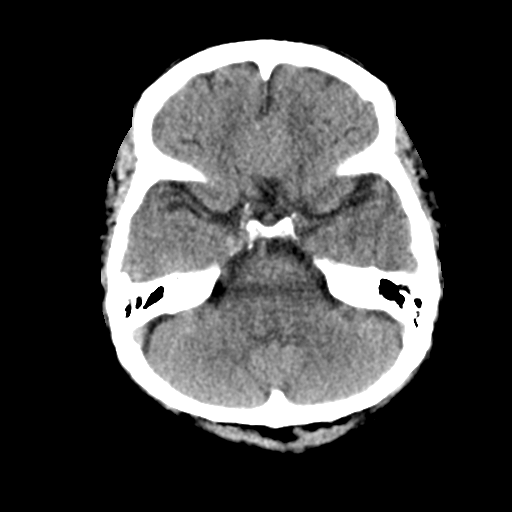
[im 12/31  brain]
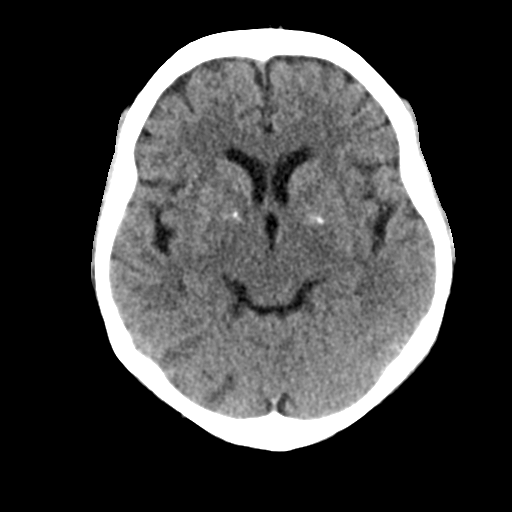
[im 16/31  brain]
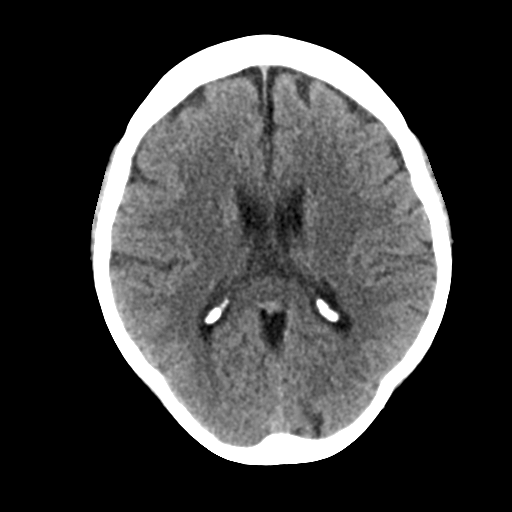
[im 19/31  brain]
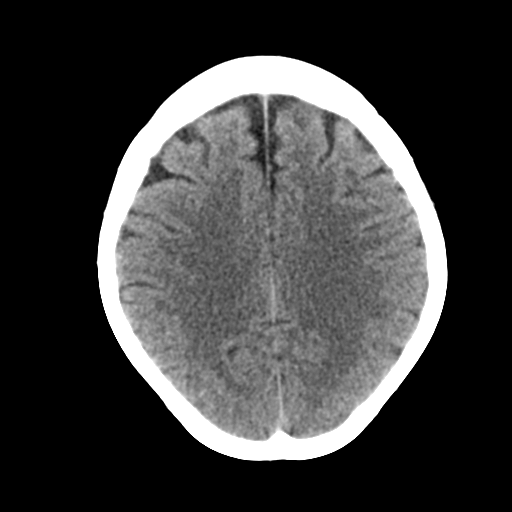
[im 19/31  bone]
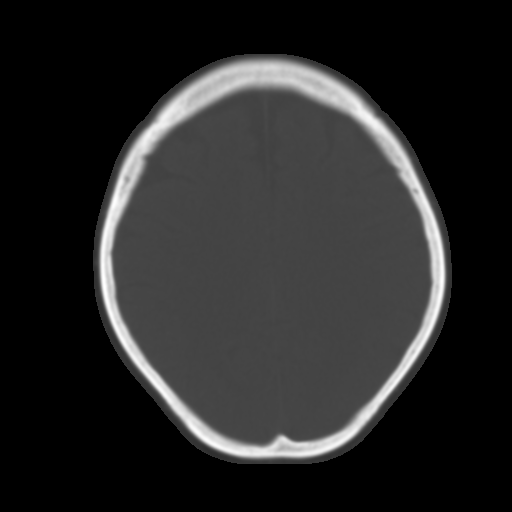
[im 23/31  brain]
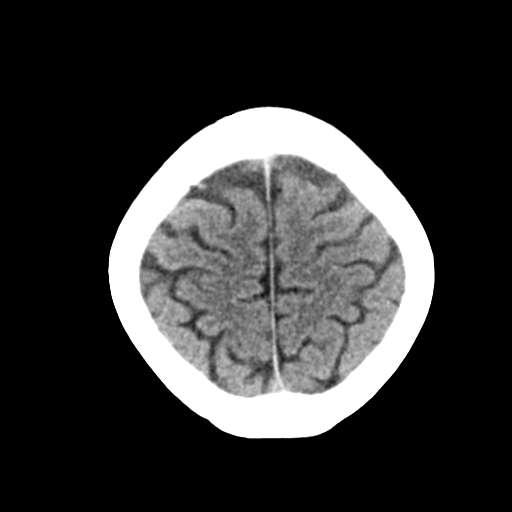
[im 27/31  brain]
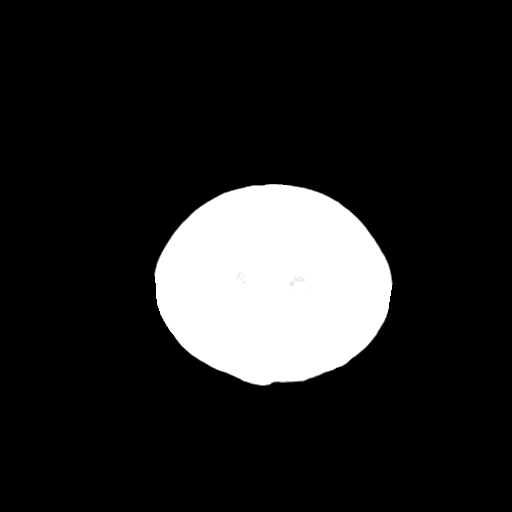

[Series 3: head bone · axial · 0.39mm/px · z∈[+1172,+1202]mm · 3 of 77 slices shown]
[im 8/77  bone]
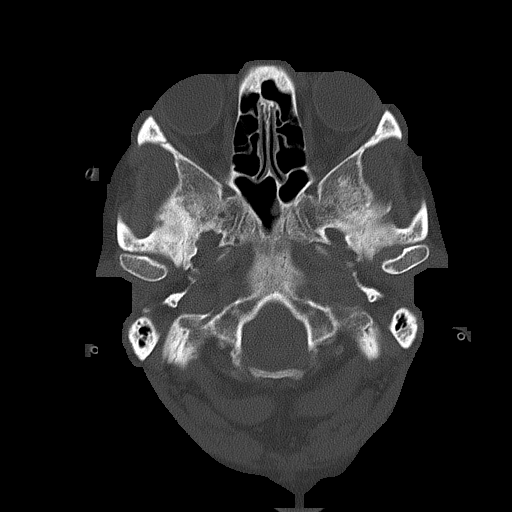
[im 16/77  bone]
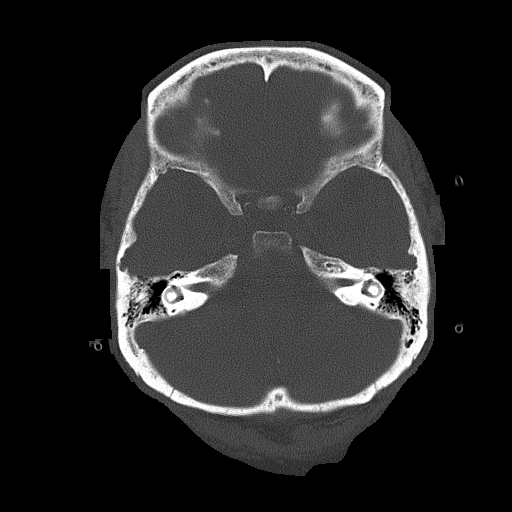
[im 23/77  bone]
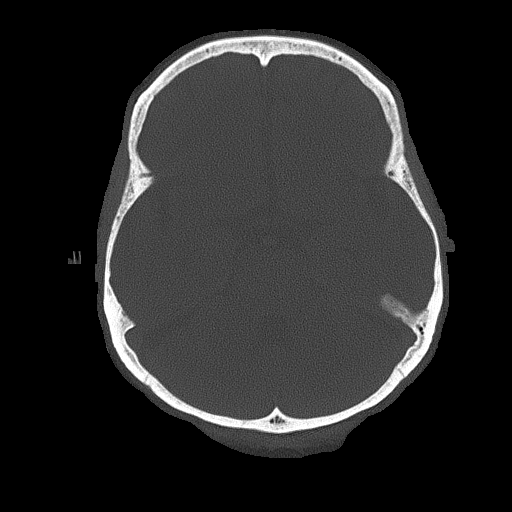

[Series 4: head without cor · coronal · non-contrast · 0.32mm/px · 3 of 63 slices shown]
[im 21/63  brain]
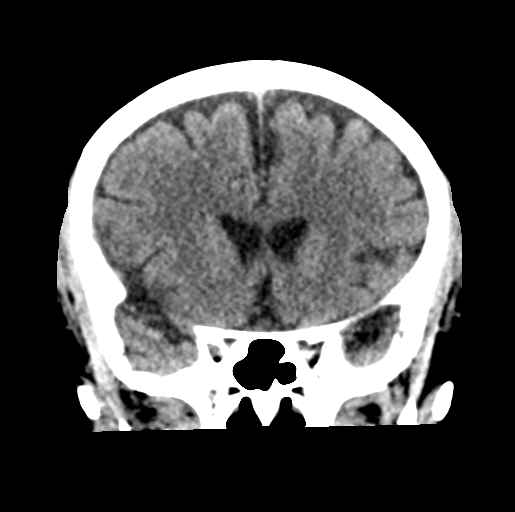
[im 28/63  brain]
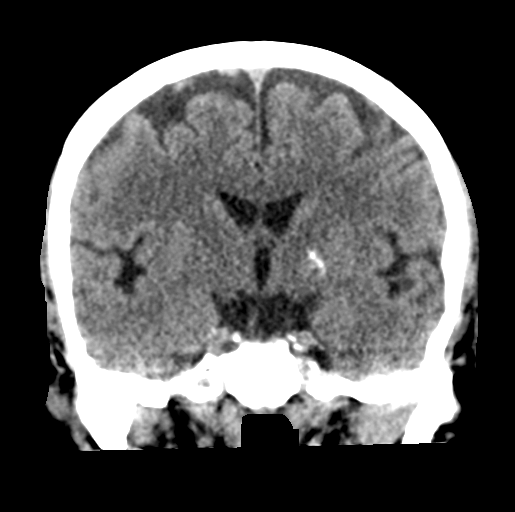
[im 35/63  brain]
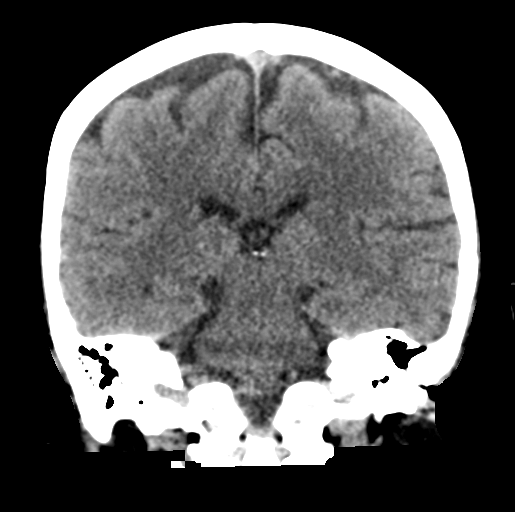

[Series 5: head without sag · sagittal · non-contrast · 0.30mm/px · 3 of 53 slices shown]
[im 18/53  brain]
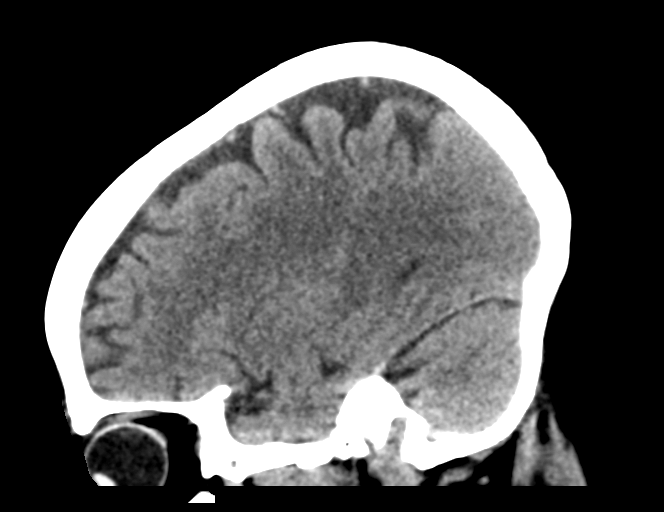
[im 27/53  brain]
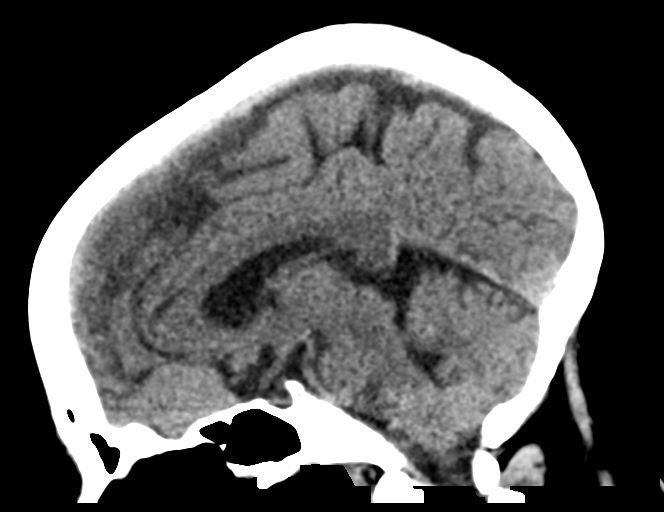
[im 35/53  brain]
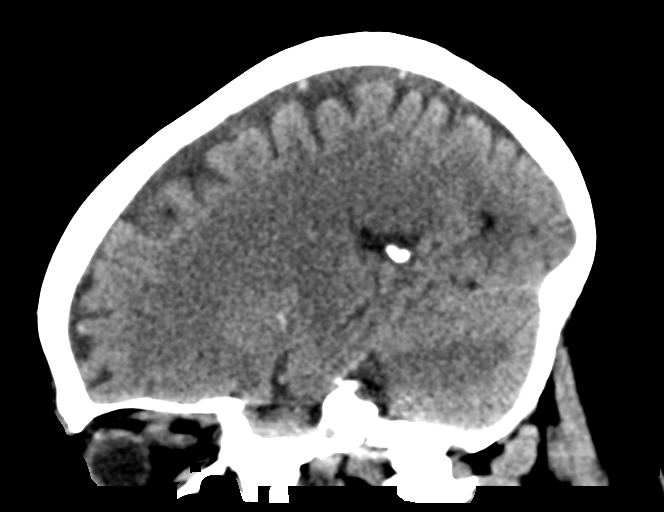

[16 of 47 positions shown; findings below may reference images not displayed]

FINDINGS: Brain: The ventricles are normal in size and configuration. The
previously noted meningioma in the planum sphenoidale region is
again noted, less than optimally seen on noncontrast enhanced study.
It currently measures 2.9 x 2.3 x 1.8 cm, slightly larger than on
prior study. There is no surrounding edema, and this meningioma does
not cause significant mass effect. There is no other mass evident.
There is no hemorrhage, extra-axial fluid collection, or midline
shift. Mild basal ganglia calcification bilaterally is felt to be
physiologic in this age group. No evident acute infarct.

Vascular: There is no vascular calcification evident. No hyperdense
vessel.

Skull: The bony calvarium appears intact.

Sinuses/Orbits: Visualized paranasal sinuses are clear except for
modest mucosal thickening in the right sphenoid sinus region.
Frontal sinuses are hypoplastic. Visualized orbits appear symmetric
bilaterally.

Other: Mastoid air cells are clear. There is debris in both external
auditory canals.
IMPRESSION: Planum sphenoidale region meningioma again noted, modestly increased
in size from approximately 10 years prior. No surrounding edema. No
new mass evident. No hemorrhage, extra-axial fluid collection, or
midline shift. No evidence suggesting acute infarct.

Debris in each external auditory canal, likely cerumen. Minimal
right sphenoid sinus disease.

## 2018-09-17 ENCOUNTER — Other Ambulatory Visit: Payer: Self-pay | Admitting: Oncology

## 2018-09-17 ENCOUNTER — Other Ambulatory Visit: Payer: Self-pay | Admitting: Family Medicine

## 2018-09-17 NOTE — Telephone Encounter (Signed)
Do I need to send refill request to PCP?

## 2018-09-20 ENCOUNTER — Other Ambulatory Visit (HOSPITAL_COMMUNITY): Payer: Self-pay | Admitting: Cardiology

## 2018-09-20 ENCOUNTER — Other Ambulatory Visit: Payer: Self-pay | Admitting: Family Medicine

## 2018-09-23 NOTE — Progress Notes (Signed)
Patient Care Team: [name], MD as PCP - Bethena Roys, MD as Consulting Physician (Cardiology) Cottle, Billey Co., MD as Attending Physician (Psychiatry)  DIAGNOSIS:    ICD-10-CM   1. Malignant neoplasm of upper-outer quadrant of right breast in female, estrogen receptor negative ([place]) C50.411    Z17.1     SUMMARY OF ONCOLOGIC HISTORY:   Malignant neoplasm of right breast ([name])   02/03/2007 Surgery    Right breast lumpectomy: Invasive ductal carcinoma 1.5 cm grade 3, 1/2 sentinel nodes positive, ER negative, PR negative, Ki-67 35%, HER-2 negative    03/08/2007 Surgery    Right axillary lymph node dissection: 2/8 lymph nodes positive    04/12/2007 - 08/13/2007 Chemotherapy    FEC and Taxotere    09/20/2007 - 11/15/2007 Radiation Therapy    Adjuvant radiation    04/01/2018 Relapse/Recurrence    3 irregular masses right breast UOQ.  Ultrasound reveals 0.9 cm, 1.3 cm, 1 cm no abnormal lymph nodes, right breast biopsy 10 o'clock position: IDC grade 3, ER 0%, PR 0%, Ki-67 60%, HER-2 negative ratio 1.74    06/09/2018 Surgery    Right mastectomy: Grade 3 IDC, 1.2 cm, high-grade DCIS, margins negative, negative for lymphovascular or perineural invasion, ER 0%, PR 0%, HER-2 negative, Ki-67 60%    07/16/2018 -  Chemotherapy    CMF adjuvant chemotherapy for 6 cycles      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 ([facility])    CHIEF COMPLIANT: Cycle 4 CMF  INTERVAL HISTORY: Tara [name] is a 73 y.o. with above-mentioned history of right breast cancer treated with mastectomy and is now on adjuvant chemotherapy with CMF. She presents to the clinic today with her family member. She reports the day after her last treatment she had several large, red welts on her left arm that resolved with Benadryl by the next day. Diarrhea has resolved, appetite is normal, and abnormal tears have resolved. She saw her cardiologist, Dr. Thompson Grayer, yesterday for palpitations,  which he attributed to chemotherapy and were not of concern.   REVIEW OF SYSTEMS:   Constitutional: Denies fevers, chills or abnormal weight loss Eyes: Denies blurriness of vision Ears, nose, mouth, throat, and face: Denies mucositis or sore throat Respiratory: Denies cough, dyspnea or wheezes Cardiovascular: Denies chest discomfort (+) palpitations Gastrointestinal:  Denies nausea, heartburn or change in bowel habits Skin: (+) red welts on left arm, resolved Lymphatics: Denies new lymphadenopathy or easy bruising Neurological:Denies numbness, tingling or new weaknesses Behavioral/Psych: Mood is stable, no new changes  Extremities: No lower extremity edema Breast: denies any pain or lumps or nodules in either breasts All other systems were reviewed with the patient and are negative.  I have reviewed the past medical history, past surgical history, social history and family history with the patient and they are unchanged from previous note.  ALLERGIES:  is allergic to aspirin; lisinopril; sudafed  [pseudoephedrine hcl]; and sudafed [pseudoephedrine hcl].  MEDICATIONS:  Current Outpatient Medications  Medication Sig Dispense Refill  . acetaminophen (TYLENOL) 500 MG tablet Take 500-1,000 mg by mouth every 6 (six) hours as needed (FOR PAIN.).    Marland Kitchen aspirin EC 81 MG tablet Take 81 mg by mouth daily.     Marland Kitchen azelastine (ASTELIN) 0.1 % nasal spray Place 2 sprays into both nostrils 2 (two) times daily. Use in each nostril as directed (Patient taking differently: Place 2 sprays into both nostrils 2 (two) times daily as needed (cold symptoms). Use in  each nostril as directed) 30 mL 12  . clonazePAM (KLONOPIN) 0.5 MG tablet TAKE (1) TABLET BY MOUTH AT BEDTIME. 90 tablet 0  . docusate sodium (COLACE) 100 MG capsule Take 200 mg by mouth 2 (two) times daily.     . folic acid (FOLVITE) 1 MG tablet TAKE ONE TABLET BY MOUTH DAILY. 100 tablet 0  . HYDROcodone-acetaminophen (NORCO/VICODIN) 5-325 MG tablet  Take 1-2 tablets by mouth every 6 (six) hours as needed for moderate pain. 20 tablet 0  . lidocaine-prilocaine (EMLA) cream Apply to affected area once 30 g 3  . lovastatin (MEVACOR) 40 MG tablet TAKE 1 TABLET BY MOUTH AT BEDTIME 90 tablet 2  . metoCLOPramide (REGLAN) 10 MG tablet TAKE ONE TABLET BY MOUTH TWICE DAILY. 180 tablet 0  . metoprolol succinate (TOPROL-XL) 50 MG 24 hr tablet TAKE 1 TABLET BY MOUTH IN THE MORNING AND 1/2 TABLET IN THE EVENING. 45 tablet 0  . montelukast (SINGULAIR) 10 MG tablet TAKE ONE TABLET BY MOUTH AT BEDTIME. 30 tablet 5  . ondansetron (ZOFRAN) 8 MG tablet Take 1 tablet (8 mg total) by mouth 2 (two) times daily as needed for refractory nausea / vomiting. Start on day 3 after chemotherapy. 30 tablet 1  . pantoprazole (PROTONIX) 40 MG tablet TAKE (1) TABLET BY MOUTH TWICE DAILY. 180 tablet 0  . prochlorperazine (COMPAZINE) 10 MG tablet Take 1 tablet (10 mg total) by mouth every 6 (six) hours as needed (Nausea or vomiting). 30 tablet 1  . sacubitril-valsartan (ENTRESTO) 24-26 MG Take 1 tablet by mouth twice daily. NEED APPOINTMENT 60 tablet 0  . sertraline (ZOLOFT) 50 MG tablet TAKE 1 TABLET BY MOUTH ONCE A DAY. 90 tablet 0  . spironolactone (ALDACTONE) 25 MG tablet TAKE 1 TABLET BY MOUTH ONCE A DAY. (Patient taking differently: Take 25 mg by mouth daily. ) 30 tablet 12  . zolpidem (AMBIEN) 10 MG tablet TAKE 1 TABLET BY MOUTH AT BEDTIME AS NEEDED FOR SLEEP. 30 tablet 3   No current facility-administered medications for this visit.     PHYSICAL EXAMINATION: ECOG PERFORMANCE STATUS: 1 - Symptomatic but completely ambulatory  Vitals:   09/28/18 0959  BP: (!) 99/56  Pulse: 64  Resp: 18  Temp: 97.7 F (36.5 C)  SpO2: 95%   Filed Weights   09/28/18 0959  Weight: 187 lb 12.8 oz (85.2 kg)    GENERAL:alert, no distress and comfortable SKIN: skin color, texture, turgor are normal, no rashes or significant lesions EYES: normal, Conjunctiva are pink and  non-injected, sclera clear OROPHARYNX:no exudate, no erythema and lips, buccal mucosa, and tongue normal  NECK: supple, thyroid normal size, non-tender, without nodularity LYMPH:  no palpable lymphadenopathy in the cervical, axillary or inguinal LUNGS: clear to auscultation and percussion with normal breathing effort HEART: regular rate & rhythm and no murmurs and no lower extremity edema ABDOMEN:abdomen soft, non-tender and normal bowel sounds MUSCULOSKELETAL:no cyanosis of digits and no clubbing  NEURO: alert & oriented x 3 with fluent speech, no focal motor/sensory deficits EXTREMITIES: No lower extremity edema  LABORATORY DATA:  I have reviewed the data as listed CMP Latest Ref Rng & Units 08/27/2018 08/06/2018 08/05/2018  Glucose 70 - 99 mg/dL 110(H) 100(H) 96  BUN 8 - 23 mg/dL 20 26(H) 29(H)  Creatinine 0.44 - 1.00 mg/dL 1.55(H) 1.66(H) 1.68(H)  Sodium 135 - 145 mmol/L 140 139 136  Potassium 3.5 - 5.1 mmol/L 4.7 5.0 5.0  Chloride 98 - 111 mmol/L 109 108 105  CO2 22 -  32 mmol/L 20(L) 21(L) 21  Calcium 8.9 - 10.3 mg/dL 8.4(L) 8.6(L) 8.7  Total Protein 6.5 - 8.1 g/dL 6.9 7.1 6.8  Total Bilirubin 0.3 - 1.2 mg/dL 0.2(L) 0.3 0.2  Alkaline Phos 38 - 126 U/L 132(H) 124 -  AST 15 - 41 U/L 11(L) 13(L) 13  ALT 0 - 44 U/L _0 Lab Results  Component Value Date   WBC 7.7 09/28/2018   HGB 10.0 (L) 09/28/2018   HCT 31.8 (L) 09/28/2018   MCV 88.1 09/28/2018   PLT 144 (L) 09/28/2018   NEUTROABS 5.7 09/28/2018    ASSESSMENT & PLAN:  Malignant neoplasm of right breast ([place]) Original cancer diagnosed 2008 treated with lumpectomy, 2/8 lymph nodes positive, triple negative, adjuvant chemo FEC Taxotere, radiation  06/09/2018 right mastectomy: Grade 3 IDC, 1.2 cm, high-grade DCIS, margins negative, negative for lymphovascular or perineural invasion, ER 0%, PR 0%, HER-2 negative, Ki-67 60%  Current treatment: Adjuvant CMF chemotherapy started10/25/2019, today's cycle 4 day 1  Chemo  toxicities: Patient does not have any nausea vomiting   Diarrhea: No further diarrhea. Chemotherapy-induced anemia: Hemoglobin 10 being monitored closely Skin reaction: Fairly mild left forearm skin erythema (patient calls them welts) after the last chemotherapy .  We will give her Benadryl premedication.  Monitoring closely for toxicities Return to clinic in 3 weeks for cycle 5 CMF.    No orders of the defined types were placed in this encounter.  The patient has a good understanding of the overall plan. she agrees with it. she will call with any problems that may develop before the next visit here.  Nicholas Lose, MD 09/28/2018   I, Cloyde Reams Dorshimer, am acting as scribe for Nicholas Lose, MD.  I have reviewed the above documentation for accuracy and completeness, and I agree with the above.

## 2018-09-27 ENCOUNTER — Ambulatory Visit: Payer: Medicare Other | Admitting: Internal Medicine

## 2018-09-27 ENCOUNTER — Encounter: Payer: Self-pay | Admitting: Internal Medicine

## 2018-09-27 VITALS — BP 108/64 | HR 59 | Ht <= 58 in | Wt 186.8 lb

## 2018-09-27 DIAGNOSIS — I10 Essential (primary) hypertension: Secondary | ICD-10-CM | POA: Diagnosis not present

## 2018-09-27 DIAGNOSIS — I471 Supraventricular tachycardia: Secondary | ICD-10-CM | POA: Diagnosis not present

## 2018-09-27 DIAGNOSIS — I5022 Chronic systolic (congestive) heart failure: Secondary | ICD-10-CM

## 2018-09-27 NOTE — Progress Notes (Signed)
PCP: [name], MD Primary Cardiologist: Dr Aundra Dubin Primary EP: Dr Rayann Heman  Tara [name] is a 73 y.o. female who presents today for routine electrophysiology followup.  Since last being seen in our clinic, the patient reports doing very well.  She received chemo in November and early December but hasnt taken since due to holidays.  She reports that on 3 occasions after chemo, that she had brief palpitations (lasting only several seconds).  She has had no sustained arrhythmias. Today, she denies symptoms of chest pain, shortness of breath,  lower extremity edema, dizziness, presyncope, or syncope.  The patient is otherwise without complaint today.   Past Medical History:  Diagnosis Date  . Abnormal mammogram of right breast 07/29/2017  . 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 Anxiety   . Breast cancer [facility]) 2008   right - s/p lumpectomy->chemo, radiation  . Depression   . Dysrhythmia    hx SVT  . Family history of colon cancer   . Family history of prostate cancer   . GERD (gastroesophageal reflux disease)   . H/O: hysterectomy   . Hiatal hernia   . History of cancer chemotherapy   . History of radiation therapy   . Hyperlipidemia   . Hypertension 01/25/2018  . Nonischemic cardiomyopathy ([place])   . Personal history of radiation therapy 01/05/209  . SVT (supraventricular tachycardia) ([place])    short RP SVT documented 5/14  . 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[place])   . 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 ABDOMINAL HYSTERECTOMY  1994   fibroids,   . BIOPSY  06/19/2016   Procedure: BIOPSY;  Surgeon: Daneil Dolin, MD;  [facility];  Service: Endoscopy;;  gastric duodenum  . BREAST BIOPSY Right 12/16/2006   malignant  . BREAST EXCISIONAL BIOPSY Right 2018   benign lumpectomy  . BREAST LUMPECTOMY Right 02/2007  . BREAST  LUMPECTOMY WITH RADIOACTIVE SEED LOCALIZATION Right 07/29/2017   Procedure: RIGHT BREAST LUMPECTOMY WITH RADIOACTIVE SEED LOCALIZATION;  Surgeon: Fanny Skates, MD;  Location: [place];  Service: General;  Laterality: Right;  . BREAST SURGERY Right 2008   lumpectomy, cancer  . CARDIAC CATHETERIZATION    . CHOLECYSTECTOMY  1999  . COLONOSCOPY  2008   Dr. Oneida Alar: multiple polyps. Path not available at time of visit.   Marland Kitchen COLONOSCOPY WITH PROPOFOL N/A 04/25/2014   Dr. Oneida Alar: Multiple tubular adenomas removed. Diverticulosis. Moderate internal hemorrhoids. Next colonoscopy planned for August 2018.  Marland Kitchen ESOPHAGOGASTRODUODENOSCOPY (EGD) WITH PROPOFOL N/A 06/19/2016   Procedure: ESOPHAGOGASTRODUODENOSCOPY (EGD) WITH PROPOFOL;  Surgeon: Daneil Dolin, MD;  [facility];  Service: Endoscopy;  Laterality: N/A;  . MASTECTOMY W/ SENTINEL NODE BIOPSY Right 06/09/2018   Procedure: RIGHT TOTAL MASTECTOMY WITH SENTINEL LYMPH NODE BIOPSY;  Surgeon: Fanny Skates, MD;  Location: [place];  Service: General;  Laterality: Right;  . POLYPECTOMY N/A 04/25/2014   Procedure: POLYPECTOMY;  Surgeon: Danie Binder, MD;  Location: [facility];  Service: Endoscopy;  Laterality: N/A;  Ascending and Decending Colon x3 , Transverse colon x2, rectal  . PORTACATH PLACEMENT N/A 06/09/2018   Procedure: INSERTION PORT-A-CATH;  Surgeon: Fanny Skates, MD;  Location: [place];  Service: General;  Laterality: N/A;  . RIGHT/LEFT HEART CATH AND CORONARY ANGIOGRAPHY N/A 07/10/2017   Procedure: RIGHT/LEFT HEART CATH  AND CORONARY ANGIOGRAPHY;  Surgeon: Larey Dresser, MD;  Location: [place] [facility];  Service: Cardiovascular;  Laterality: N/A;    ROS- all systems are reviewed and negatives except as per HPI above  Current Outpatient Medications  Medication Sig Dispense Refill  . acetaminophen (TYLENOL) 500 MG tablet Take 500-1,000 mg by mouth every 6 (six) hours as needed (FOR PAIN.).    Marland Kitchen aspirin EC 81 MG tablet Take 81 mg by mouth  daily.     Marland Kitchen azelastine (ASTELIN) 0.1 % nasal spray Place 2 sprays into both nostrils 2 (two) times daily. Use in each nostril as directed (Patient taking differently: Place 2 sprays into both nostrils 2 (two) times daily as needed (cold symptoms). Use in each nostril as directed) 30 mL 12  . clonazePAM (KLONOPIN) 0.5 MG tablet TAKE (1) TABLET BY MOUTH AT BEDTIME. 90 tablet 0  . docusate sodium (COLACE) 100 MG capsule Take 200 mg by mouth 2 (two) times daily.     . folic acid (FOLVITE) 1 MG tablet TAKE ONE TABLET BY MOUTH DAILY. 100 tablet 0  . HYDROcodone-acetaminophen (NORCO/VICODIN) 5-325 MG tablet Take 1-2 tablets by mouth every 6 (six) hours as needed for moderate pain. 20 tablet 0  . lidocaine-prilocaine (EMLA) cream Apply to affected area once 30 g 3  . lovastatin (MEVACOR) 40 MG tablet TAKE 1 TABLET BY MOUTH AT BEDTIME 90 tablet 2  . metoCLOPramide (REGLAN) 10 MG tablet TAKE ONE TABLET BY MOUTH TWICE DAILY. 180 tablet 0  . metoprolol succinate (TOPROL-XL) 50 MG 24 hr tablet TAKE 1 TABLET BY MOUTH IN THE MORNING AND 1/2 TABLET IN THE EVENING. 45 tablet 0  . montelukast (SINGULAIR) 10 MG tablet TAKE ONE TABLET BY MOUTH AT BEDTIME. 30 tablet 5  . ondansetron (ZOFRAN) 8 MG tablet Take 1 tablet (8 mg total) by mouth 2 (two) times daily as needed for refractory nausea / vomiting. Start on day 3 after chemotherapy. 30 tablet 1  . pantoprazole (PROTONIX) 40 MG tablet TAKE (1) TABLET BY MOUTH TWICE DAILY. 180 tablet 0  . prochlorperazine (COMPAZINE) 10 MG tablet Take 1 tablet (10 mg total) by mouth every 6 (six) hours as needed (Nausea or vomiting). 30 tablet 1  . sacubitril-valsartan (ENTRESTO) 24-26 MG Take 1 tablet by mouth twice daily. NEED APPOINTMENT 60 tablet 0  . sertraline (ZOLOFT) 50 MG tablet TAKE 1 TABLET BY MOUTH ONCE A DAY. 90 tablet 0  . spironolactone (ALDACTONE) 25 MG tablet TAKE 1 TABLET BY MOUTH ONCE A DAY. (Patient taking differently: Take 25 mg by mouth daily. ) 30 tablet 12  .  zolpidem (AMBIEN) 10 MG tablet TAKE 1 TABLET BY MOUTH AT BEDTIME AS NEEDED FOR SLEEP. 30 tablet 3   No current facility-administered medications for this visit.     Physical Exam: Vitals:   09/27/18 1234  BP: 108/64  Pulse: (!) 59  SpO2: 97%  Weight: 186 lb 12.8 oz (84.7 kg)  Height: 4\' 9"  (1.448 m)    GEN- The patient is well appearing, alert and oriented x 3 today.   Head- normocephalic, atraumatic Eyes-  Sclera clear, conjunctiva pink Ears- hearing intact Oropharynx- clear Lungs- Clear to ausculation bilaterally, normal work of breathing Heart- Regular rate and rhythm, no murmurs, rubs or gallops, PMI not laterally displaced GI- soft, NT, ND, + BS Extremities- no clubbing, cyanosis, or edema  Wt Readings from Last 3 Encounters:  09/27/18 186 lb 12.8 oz (84.7 kg)  08/27/18 189 lb 11.2 oz (86  kg)  08/06/18 190 lb 12.8 oz (86.5 kg)    EKG tracing ordered today is personally reviewed and shows  Sinus rhythm 59 bpm, PR 164 msec, QRS 98 msec, QTc 397 msec, poo r wave progression  Assessment and Plan:  1. SVT Rare palpitations Well controlled No changes at this time  2. Chronic systolic dysfunction/ nonischemic CM Stable EF 49% Overdue to follow-up with Dr Aundra Dubin  3. HTN Stable No change required today  Follow-up in the advanced CHF clinic I will see as needed   Thompson Grayer MD, [facility] 09/27/2018 12:46 PM

## 2018-09-27 NOTE — Patient Instructions (Addendum)
Medication Instructions:  Your physician recommends that you continue on your current medications as directed. Please refer to the Current Medication list given to you today.  Labwork: None ordered.  Testing/Procedures: None ordered.  Follow-Up: Your physician wants you to follow-up in: 3 months with Dr. Aundra Dubin.        Any Other Special Instructions Will Be Listed Below (If Applicable).  If you need a refill on your cardiac medications before your next appointment, please call your pharmacy.

## 2018-09-28 ENCOUNTER — Inpatient Hospital Stay: Payer: Medicare Other

## 2018-09-28 ENCOUNTER — Inpatient Hospital Stay (HOSPITAL_BASED_OUTPATIENT_CLINIC_OR_DEPARTMENT_OTHER): Payer: Medicare Other | Admitting: Hematology and Oncology

## 2018-09-28 ENCOUNTER — Telehealth: Payer: Self-pay | Admitting: Hematology and Oncology

## 2018-09-28 ENCOUNTER — Inpatient Hospital Stay: Payer: Medicare Other | Attending: Hematology and Oncology

## 2018-09-28 DIAGNOSIS — C50811 Malignant neoplasm of overlapping sites of right female breast: Secondary | ICD-10-CM

## 2018-09-28 DIAGNOSIS — Z171 Estrogen receptor negative status [ER-]: Secondary | ICD-10-CM | POA: Diagnosis not present

## 2018-09-28 DIAGNOSIS — Z95828 Presence of other vascular implants and grafts: Secondary | ICD-10-CM

## 2018-09-28 DIAGNOSIS — D6481 Anemia due to antineoplastic chemotherapy: Secondary | ICD-10-CM | POA: Insufficient documentation

## 2018-09-28 DIAGNOSIS — Z5111 Encounter for antineoplastic chemotherapy: Secondary | ICD-10-CM | POA: Diagnosis not present

## 2018-09-28 DIAGNOSIS — C50411 Malignant neoplasm of upper-outer quadrant of right female breast: Secondary | ICD-10-CM

## 2018-09-28 LAB — CBC WITH DIFFERENTIAL (CANCER CENTER ONLY)
Abs Immature Granulocytes: 0.03 10*3/uL (ref 0.00–0.07)
BASOS ABS: 0 10*3/uL (ref 0.0–0.1)
BASOS PCT: 0 %
Eosinophils Absolute: 0.3 10*3/uL (ref 0.0–0.5)
Eosinophils Relative: 4 %
HCT: 31.8 % — ABNORMAL LOW (ref 36.0–46.0)
Hemoglobin: 10 g/dL — ABNORMAL LOW (ref 12.0–15.0)
Immature Granulocytes: 0 %
Lymphocytes Relative: 13 %
Lymphs Abs: 1 10*3/uL (ref 0.7–4.0)
MCH: 27.7 pg (ref 26.0–34.0)
MCHC: 31.4 g/dL (ref 30.0–36.0)
MCV: 88.1 fL (ref 80.0–100.0)
Monocytes Absolute: 0.7 10*3/uL (ref 0.1–1.0)
Monocytes Relative: 9 %
Neutro Abs: 5.7 10*3/uL (ref 1.7–7.7)
Neutrophils Relative %: 74 %
PLATELETS: 144 10*3/uL — AB (ref 150–400)
RBC: 3.61 MIL/uL — AB (ref 3.87–5.11)
RDW: 20.9 % — ABNORMAL HIGH (ref 11.5–15.5)
WBC: 7.7 10*3/uL (ref 4.0–10.5)
nRBC: 0 % (ref 0.0–0.2)

## 2018-09-28 LAB — CMP (CANCER CENTER ONLY)
ALT: 8 U/L (ref 0–44)
ANION GAP: 11 (ref 5–15)
AST: 10 U/L — ABNORMAL LOW (ref 15–41)
Albumin: 3.4 g/dL — ABNORMAL LOW (ref 3.5–5.0)
Alkaline Phosphatase: 124 U/L (ref 38–126)
BUN: 49 mg/dL — ABNORMAL HIGH (ref 8–23)
CO2: 19 mmol/L — ABNORMAL LOW (ref 22–32)
Calcium: 8.9 mg/dL (ref 8.9–10.3)
Chloride: 113 mmol/L — ABNORMAL HIGH (ref 98–111)
Creatinine: 1.47 mg/dL — ABNORMAL HIGH (ref 0.44–1.00)
GFR, Est AFR Am: 41 mL/min — ABNORMAL LOW (ref >60)
GFR, Estimated: 35 mL/min — ABNORMAL LOW (ref >60)
Glucose, Bld: 114 mg/dL — ABNORMAL HIGH (ref 70–99)
Potassium: 4.4 mmol/L (ref 3.5–5.1)
Sodium: 143 mmol/L (ref 135–145)
Total Bilirubin: 0.4 mg/dL (ref 0.3–1.2)
Total Protein: 7.1 g/dL (ref 6.5–8.1)

## 2018-09-28 MED ORDER — PALONOSETRON HCL INJECTION 0.25 MG/5ML
0.2500 mg | Freq: Once | INTRAVENOUS | Status: AC
  Administered 2018-09-28: 0.25 mg via INTRAVENOUS

## 2018-09-28 MED ORDER — DIPHENHYDRAMINE HCL 50 MG/ML IJ SOLN
INTRAMUSCULAR | Status: AC
  Filled 2018-09-28: qty 1

## 2018-09-28 MED ORDER — FAMOTIDINE IN NACL 20-0.9 MG/50ML-% IV SOLN
20.0000 mg | Freq: Once | INTRAVENOUS | Status: AC
  Administered 2018-09-28: 20 mg via INTRAVENOUS

## 2018-09-28 MED ORDER — METHOTREXATE SODIUM (PF) CHEMO INJECTION 250 MG/10ML
39.9000 mg/m2 | Freq: Once | INTRAMUSCULAR | Status: AC
  Administered 2018-09-28: 75 mg via INTRAVENOUS
  Filled 2018-09-28: qty 3

## 2018-09-28 MED ORDER — PALONOSETRON HCL INJECTION 0.25 MG/5ML
INTRAVENOUS | Status: AC
  Filled 2018-09-28: qty 5

## 2018-09-28 MED ORDER — DIPHENHYDRAMINE HCL 50 MG/ML IJ SOLN
25.0000 mg | Freq: Once | INTRAMUSCULAR | Status: AC
  Administered 2018-09-28: 25 mg via INTRAVENOUS

## 2018-09-28 MED ORDER — SODIUM CHLORIDE 0.9% FLUSH
10.0000 mL | INTRAVENOUS | Status: DC | PRN
  Administered 2018-09-28: 10 mL
  Filled 2018-09-28: qty 10

## 2018-09-28 MED ORDER — FLUOROURACIL CHEMO INJECTION 2.5 GM/50ML
600.0000 mg/m2 | Freq: Once | INTRAVENOUS | Status: AC
  Administered 2018-09-28: 1150 mg via INTRAVENOUS
  Filled 2018-09-28: qty 23

## 2018-09-28 MED ORDER — SODIUM CHLORIDE 0.9 % IV SOLN
600.0000 mg/m2 | Freq: Once | INTRAVENOUS | Status: AC
  Administered 2018-09-28: 1120 mg via INTRAVENOUS
  Filled 2018-09-28: qty 56

## 2018-09-28 MED ORDER — HEPARIN SOD (PORK) LOCK FLUSH 100 UNIT/ML IV SOLN
500.0000 [IU] | Freq: Once | INTRAVENOUS | Status: AC | PRN
  Administered 2018-09-28: 500 [IU]
  Filled 2018-09-28: qty 5

## 2018-09-28 MED ORDER — SODIUM CHLORIDE 0.9% FLUSH
10.0000 mL | Freq: Once | INTRAVENOUS | Status: AC
  Administered 2018-09-28: 10 mL
  Filled 2018-09-28: qty 10

## 2018-09-28 MED ORDER — FAMOTIDINE IN NACL 20-0.9 MG/50ML-% IV SOLN
INTRAVENOUS | Status: AC
  Filled 2018-09-28: qty 50

## 2018-09-28 MED ORDER — SODIUM CHLORIDE 0.9 % IV SOLN
Freq: Once | INTRAVENOUS | Status: AC
  Administered 2018-09-28: 11:00:00 via INTRAVENOUS
  Filled 2018-09-28: qty 250

## 2018-09-28 NOTE — Assessment & Plan Note (Addendum)
Original cancer diagnosed 2008 treated with lumpectomy, 2/8 lymph nodes positive, triple negative, adjuvant chemo FEC Taxotere, radiation  06/09/2018 right mastectomy: Grade 3 IDC, 1.2 cm, high-grade DCIS, margins negative, negative for lymphovascular or perineural invasion, ER 0%, PR 0%, HER-2 negative, Ki-67 60%  Current treatment: Adjuvant CMF chemotherapy started10/25/2019, today's cycle 4 day 1  Chemo toxicities: Patient does not have any nausea vomiting   Diarrhea: No further diarrhea. Chemotherapy-induced anemia: Hemoglobin 10 being monitored closely Skin reaction: Fairly mild left forearm skin erythema (patient calls them welts) after the last chemotherapy .  We will give her Benadryl premedication.  Monitoring closely for toxicities Return to clinic in 3 weeks for cycle 5 CMF.

## 2018-09-28 NOTE — Telephone Encounter (Signed)
Gave avs and calendar ° °

## 2018-09-28 NOTE — Patient Instructions (Signed)
[place] Discharge Instructions for Patients Receiving Chemotherapy  Today you received the following chemotherapy agents: Cytoxan, Methotrexate, & 5FU.  To help prevent nausea and vomiting after your treatment, we encourage you to take your nausea medication as prescribed. If you develop nausea and vomiting that is not controlled by your nausea medication, call the clinic.   BELOW ARE SYMPTOMS THAT SHOULD BE REPORTED IMMEDIATELY:  *FEVER GREATER THAN 100.5 F  *CHILLS WITH OR WITHOUT FEVER  NAUSEA AND VOMITING THAT IS NOT CONTROLLED WITH YOUR NAUSEA MEDICATION  *UNUSUAL SHORTNESS OF BREATH  *UNUSUAL BRUISING OR BLEEDING  TENDERNESS IN MOUTH AND THROAT WITH OR WITHOUT PRESENCE OF ULCERS  *URINARY PROBLEMS  *BOWEL PROBLEMS  UNUSUAL RASH Items with * indicate a potential emergency and should be followed up as soon as possible.  Feel free to call the clinic should you have any questions or concerns. The clinic phone number is [phone].  Please show the [facility] at check-in to the Emergency Department and triage nurse.   Cyclophosphamide injection What is this medicine? CYCLOPHOSPHAMIDE (sye kloe FOSS fa mide) is a chemotherapy drug. It slows the growth of cancer cells. This medicine is used to treat many types of cancer like lymphoma, myeloma, leukemia, breast cancer, and ovarian cancer, to name a few. This medicine may be used for other purposes; ask your health care provider or pharmacist if you have questions. COMMON BRAND NAME(S): Cytoxan, Neosar What should I tell my health care provider before I take this medicine? They need to know if you have any of these conditions: -blood disorders -history of other chemotherapy -infection -kidney disease -liver disease -recent or ongoing radiation therapy -tumors in the bone marrow -an unusual or allergic reaction to cyclophosphamide, other chemotherapy, other medicines, foods, dyes, or  preservatives -pregnant or trying to get pregnant -breast-feeding How should I use this medicine? This drug is usually given as an injection into a vein or muscle or by infusion into a vein. It is administered in a hospital or clinic by a specially trained health care professional. Talk to your pediatrician regarding the use of this medicine in children. Special care may be needed. Overdosage: If you think you have taken too much of this medicine contact a poison control center or emergency room at once. NOTE: This medicine is only for you. Do not share this medicine with others. What if I miss a dose? It is important not to miss your dose. Call your doctor or health care professional if you are unable to keep an appointment. What may interact with this medicine? This medicine may interact with the following medications: -amiodarone -amphotericin B -azathioprine -certain antiviral medicines for HIV or AIDS such as protease inhibitors (e.g., indinavir, ritonavir) and zidovudine -certain blood pressure medications such as benazepril, captopril, enalapril, fosinopril, lisinopril, moexipril, monopril, perindopril, quinapril, ramipril, trandolapril -certain cancer medications such as anthracyclines (e.g., daunorubicin, doxorubicin), busulfan, cytarabine, paclitaxel, pentostatin, tamoxifen, trastuzumab -certain diuretics such as chlorothiazide, chlorthalidone, hydrochlorothiazide, indapamide, metolazone -certain medicines that treat or prevent blood clots like warfarin -certain muscle relaxants such as succinylcholine -cyclosporine -etanercept -indomethacin -medicines to increase blood counts like filgrastim, pegfilgrastim, sargramostim -medicines used as general anesthesia -metronidazole -natalizumab This list may not describe all possible interactions. Give your health care provider a list of all the medicines, herbs, non-prescription drugs, or dietary supplements you use. Also tell them if  you smoke, drink alcohol, or use illegal drugs. Some items may interact with your medicine. What should I watch  for while using this medicine? Visit your doctor for checks on your progress. This drug may make you feel generally unwell. This is not uncommon, as chemotherapy can affect healthy cells as well as cancer cells. Report any side effects. Continue your course of treatment even though you feel ill unless your doctor tells you to stop. Drink water or other fluids as directed. Urinate often, even at night. In some cases, you may be given additional medicines to help with side effects. Follow all directions for their use. Call your doctor or health care professional for advice if you get a fever, chills or sore throat, or other symptoms of a cold or flu. Do not treat yourself. This drug decreases your body's ability to fight infections. Try to avoid being around people who are sick. This medicine may increase your risk to bruise or bleed. Call your doctor or health care professional if you notice any unusual bleeding. Be careful brushing and flossing your teeth or using a toothpick because you may get an infection or bleed more easily. If you have any dental work done, tell your dentist you are receiving this medicine. You may get drowsy or dizzy. Do not drive, use machinery, or do anything that needs mental alertness until you know how this medicine affects you. Do not become pregnant while taking this medicine or for 1 year after stopping it. Women should inform their doctor if they wish to become pregnant or think they might be pregnant. Men should not father a child while taking this medicine and for 4 months after stopping it. There is a potential for serious side effects to an unborn child. Talk to your health care professional or pharmacist for more information. Do not breast-feed an infant while taking this medicine. This medicine may interfere with the ability to have a child. This medicine  has caused ovarian failure in some women. This medicine has caused reduced sperm counts in some men. You should talk with your doctor or health care professional if you are concerned about your fertility. If you are going to have surgery, tell your doctor or health care professional that you have taken this medicine. What side effects may I notice from receiving this medicine? Side effects that you should report to your doctor or health care professional as soon as possible: -allergic reactions like skin rash, itching or hives, swelling of the face, lips, or tongue -low blood counts - this medicine may decrease the number of white blood cells, red blood cells and platelets. You may be at increased risk for infections and bleeding. -signs of infection - fever or chills, cough, sore throat, pain or difficulty passing urine -signs of decreased platelets or bleeding - bruising, pinpoint red spots on the skin, black, tarry stools, blood in the urine -signs of decreased red blood cells - unusually weak or tired, fainting spells, lightheadedness -breathing problems -dark urine -dizziness -palpitations -swelling of the ankles, feet, hands -trouble passing urine or change in the amount of urine -weight gain -yellowing of the eyes or skin Side effects that usually do not require medical attention (report to your doctor or health care professional if they continue or are bothersome): -changes in nail or skin color -hair loss -missed menstrual periods -mouth sores -nausea, vomiting This list may not describe all possible side effects. Call your doctor for medical advice about side effects. You may report side effects to FDA at 1-800-FDA-1088. Where should I keep my medicine? This drug is given in a hospital  or clinic and will not be stored at home. NOTE: This sheet is a summary. It may not cover all possible information. If you have questions about this medicine, talk to your doctor, pharmacist, or  health care provider.  2018 [employer]vier/[employer] Standard (2012-07-23 16:22:58)

## 2018-10-18 ENCOUNTER — Other Ambulatory Visit (HOSPITAL_COMMUNITY): Payer: Self-pay | Admitting: Cardiology

## 2018-10-20 ENCOUNTER — Telehealth: Payer: Self-pay

## 2018-10-20 NOTE — Telephone Encounter (Signed)
Called pt to return vm regarding request to reschedule her appt on 10/22/2018 CMF. Wanting to reschedule to November 17, 2018. Pt had a death in the family and is needing to attend funeral on Friday 1/31. Will send message to scheduling to see if we can reschedule her CMF early next week. Pt needs lab/flush/md/infusion appt.  Pt to hear back for an updated schedule. Pt thankful for the call.

## 2018-10-21 ENCOUNTER — Telehealth: Payer: Self-pay | Admitting: Hematology and Oncology

## 2018-10-21 ENCOUNTER — Telehealth: Payer: Self-pay

## 2018-10-21 NOTE — Telephone Encounter (Addendum)
Called and spoke with pt regarding confirming her rescheduled chemo appt on 10/25/2018. Pt unable to make her appt tomorrow 07-Nov-2022, due to a death and funeral in the family.   Pt states that she will be available any time on Monday. Told pt that Dr.Gudena will be out of the office and one of our PA Alfredia Client) will be seeing her prior to chemo. Pt d1 cycle 5 CMF. Pt confirmed appt.

## 2018-10-21 NOTE — Telephone Encounter (Signed)
Called patient per 1/29 sch message - unable to reach patient - unable to leave message - RN made aware all appts changed to 2/3 from 1/31

## 2018-10-22 ENCOUNTER — Other Ambulatory Visit: Payer: Medicare Other

## 2018-10-22 ENCOUNTER — Ambulatory Visit: Payer: Medicare Other

## 2018-10-22 ENCOUNTER — Ambulatory Visit: Payer: Medicare Other | Admitting: Oncology

## 2018-10-25 ENCOUNTER — Inpatient Hospital Stay: Payer: Medicare Other

## 2018-10-25 ENCOUNTER — Inpatient Hospital Stay (HOSPITAL_BASED_OUTPATIENT_CLINIC_OR_DEPARTMENT_OTHER): Payer: Medicare Other | Admitting: Medical

## 2018-10-25 ENCOUNTER — Inpatient Hospital Stay: Payer: Medicare Other | Attending: Hematology and Oncology

## 2018-10-25 VITALS — BP 117/78 | HR 62 | Temp 97.6°F | Resp 18

## 2018-10-25 DIAGNOSIS — Z171 Estrogen receptor negative status [ER-]: Secondary | ICD-10-CM

## 2018-10-25 DIAGNOSIS — Z95828 Presence of other vascular implants and grafts: Secondary | ICD-10-CM

## 2018-10-25 DIAGNOSIS — D701 Agranulocytosis secondary to cancer chemotherapy: Secondary | ICD-10-CM | POA: Insufficient documentation

## 2018-10-25 DIAGNOSIS — Z5111 Encounter for antineoplastic chemotherapy: Secondary | ICD-10-CM | POA: Diagnosis not present

## 2018-10-25 DIAGNOSIS — C50811 Malignant neoplasm of overlapping sites of right female breast: Secondary | ICD-10-CM

## 2018-10-25 DIAGNOSIS — C9002 Multiple myeloma in relapse: Secondary | ICD-10-CM

## 2018-10-25 DIAGNOSIS — Z452 Encounter for adjustment and management of vascular access device: Secondary | ICD-10-CM | POA: Diagnosis not present

## 2018-10-25 DIAGNOSIS — C50411 Malignant neoplasm of upper-outer quadrant of right female breast: Secondary | ICD-10-CM | POA: Insufficient documentation

## 2018-10-25 DIAGNOSIS — L299 Pruritus, unspecified: Secondary | ICD-10-CM | POA: Diagnosis not present

## 2018-10-25 DIAGNOSIS — D6481 Anemia due to antineoplastic chemotherapy: Secondary | ICD-10-CM | POA: Insufficient documentation

## 2018-10-25 LAB — CMP (CANCER CENTER ONLY)
ALT: 7 U/L (ref 0–44)
AST: 12 U/L — ABNORMAL LOW (ref 15–41)
Albumin: 3.4 g/dL — ABNORMAL LOW (ref 3.5–5.0)
Alkaline Phosphatase: 120 U/L (ref 38–126)
Anion gap: 8 (ref 5–15)
BUN: 24 mg/dL — ABNORMAL HIGH (ref 8–23)
CALCIUM: 8.9 mg/dL (ref 8.9–10.3)
CO2: 23 mmol/L (ref 22–32)
Chloride: 112 mmol/L — ABNORMAL HIGH (ref 98–111)
Creatinine: 1.48 mg/dL — ABNORMAL HIGH (ref 0.44–1.00)
GFR, EST NON AFRICAN AMERICAN: 35 mL/min — AB (ref >60)
GFR, Est AFR Am: 41 mL/min — ABNORMAL LOW (ref >60)
Glucose, Bld: 103 mg/dL — ABNORMAL HIGH (ref 70–99)
Potassium: 5.4 mmol/L — ABNORMAL HIGH (ref 3.5–5.1)
Sodium: 143 mmol/L (ref 135–145)
Total Bilirubin: 0.4 mg/dL (ref 0.3–1.2)
Total Protein: 7.3 g/dL (ref 6.5–8.1)

## 2018-10-25 LAB — CBC WITH DIFFERENTIAL (CANCER CENTER ONLY)
Abs Immature Granulocytes: 0.05 10*3/uL (ref 0.00–0.07)
Basophils Absolute: 0 10*3/uL (ref 0.0–0.1)
Basophils Relative: 0 %
Eosinophils Absolute: 0.1 10*3/uL (ref 0.0–0.5)
Eosinophils Relative: 2 %
HEMATOCRIT: 33 % — AB (ref 36.0–46.0)
Hemoglobin: 10.1 g/dL — ABNORMAL LOW (ref 12.0–15.0)
Immature Granulocytes: 1 %
Lymphocytes Relative: 11 %
Lymphs Abs: 0.9 10*3/uL (ref 0.7–4.0)
MCH: 28.1 pg (ref 26.0–34.0)
MCHC: 30.6 g/dL (ref 30.0–36.0)
MCV: 91.9 fL (ref 80.0–100.0)
Monocytes Absolute: 0.9 10*3/uL (ref 0.1–1.0)
Monocytes Relative: 12 %
Neutro Abs: 6 10*3/uL (ref 1.7–7.7)
Neutrophils Relative %: 74 %
Platelet Count: 159 10*3/uL (ref 150–400)
RBC: 3.59 MIL/uL — ABNORMAL LOW (ref 3.87–5.11)
RDW: 18.5 % — AB (ref 11.5–15.5)
WBC Count: 8 10*3/uL (ref 4.0–10.5)
nRBC: 0 % (ref 0.0–0.2)

## 2018-10-25 MED ORDER — SODIUM CHLORIDE 0.9% FLUSH
10.0000 mL | INTRAVENOUS | Status: DC | PRN
  Administered 2018-10-25: 10 mL
  Filled 2018-10-25: qty 10

## 2018-10-25 MED ORDER — DIPHENHYDRAMINE HCL 50 MG/ML IJ SOLN: 25.0000 mg | Freq: Once | INTRAMUSCULAR | Status: DC

## 2018-10-25 MED ORDER — DIPHENHYDRAMINE HCL 25 MG PO CAPS
25.0000 mg | ORAL_CAPSULE | Freq: Once | ORAL | Status: AC
  Administered 2018-10-25: 25 mg via ORAL

## 2018-10-25 MED ORDER — HEPARIN SOD (PORK) LOCK FLUSH 100 UNIT/ML IV SOLN
500.0000 [IU] | Freq: Once | INTRAVENOUS | Status: AC | PRN
  Administered 2018-10-25: 500 [IU]
  Filled 2018-10-25: qty 5

## 2018-10-25 MED ORDER — PALONOSETRON HCL INJECTION 0.25 MG/5ML
0.2500 mg | Freq: Once | INTRAVENOUS | Status: AC
  Administered 2018-10-25: 0.25 mg via INTRAVENOUS

## 2018-10-25 MED ORDER — FAMOTIDINE IN NACL 20-0.9 MG/50ML-% IV SOLN
INTRAVENOUS | Status: AC
  Filled 2018-10-25: qty 50

## 2018-10-25 MED ORDER — SODIUM CHLORIDE 0.9 % IV SOLN
Freq: Once | INTRAVENOUS | Status: AC
  Administered 2018-10-25: 16:00:00 via INTRAVENOUS
  Filled 2018-10-25: qty 250

## 2018-10-25 MED ORDER — FLUOROURACIL CHEMO INJECTION 2.5 GM/50ML
600.0000 mg/m2 | Freq: Once | INTRAVENOUS | Status: AC
  Administered 2018-10-25: 1150 mg via INTRAVENOUS
  Filled 2018-10-25: qty 23

## 2018-10-25 MED ORDER — ALTEPLASE 2 MG IJ SOLR
INTRAMUSCULAR | Status: AC
  Filled 2018-10-25: qty 2

## 2018-10-25 MED ORDER — METHOTREXATE SODIUM (PF) CHEMO INJECTION 250 MG/10ML
39.9000 mg/m2 | Freq: Once | INTRAMUSCULAR | Status: AC
  Administered 2018-10-25: 75 mg via INTRAVENOUS
  Filled 2018-10-25: qty 3

## 2018-10-25 MED ORDER — SODIUM CHLORIDE 0.9% FLUSH
10.0000 mL | Freq: Once | INTRAVENOUS | Status: AC
  Administered 2018-10-25: 10 mL
  Filled 2018-10-25: qty 10

## 2018-10-25 MED ORDER — PALONOSETRON HCL INJECTION 0.25 MG/5ML
INTRAVENOUS | Status: AC
  Filled 2018-10-25: qty 5

## 2018-10-25 MED ORDER — ALTEPLASE 2 MG IJ SOLR
2.0000 mg | Freq: Once | INTRAMUSCULAR | Status: AC
  Administered 2018-10-25: 2 mg
  Filled 2018-10-25: qty 2

## 2018-10-25 MED ORDER — FAMOTIDINE IN NACL 20-0.9 MG/50ML-% IV SOLN
20.0000 mg | Freq: Once | INTRAVENOUS | Status: AC
  Administered 2018-10-25: 20 mg via INTRAVENOUS

## 2018-10-25 MED ORDER — SODIUM CHLORIDE 0.9 % IV SOLN
600.0000 mg/m2 | Freq: Once | INTRAVENOUS | Status: AC
  Administered 2018-10-25: 1120 mg via INTRAVENOUS
  Filled 2018-10-25: qty 56

## 2018-10-25 MED ORDER — HYDROXYZINE HCL 10 MG PO TABS: 10.0000 mg | ORAL_TABLET | Freq: Every evening | ORAL | 0 refills | Status: DC | PRN

## 2018-10-25 MED ORDER — DIPHENHYDRAMINE HCL 25 MG PO CAPS
ORAL_CAPSULE | ORAL | Status: AC
  Filled 2018-10-25: qty 1

## 2018-10-25 NOTE — Progress Notes (Signed)
[facility]pt[facility] Progress Note   Tara [name] [id number] [birth year] 73 y.o.  Tara [name] is managed by Dr. Nicholas Lose  Actively treated with chemotherapy/immunotherapy/hormonal therapy: yes  Current Therapy: CMF  Last Treated: 09/28/2018 (Cycle 4 Day 1)  Assessment: Plan:    Malignant neoplasm of upper-outer quadrant of right breast in female, estrogen receptor negative ([place])  Pruritus  1) ER negative malignant neoplasm of the right breast: The patient presents to the clinic for Cycle 5 of CMF today.  She continues to tolerate her treatment without issues of concern.  We will proceed with her treatment today.  She will return for her next cycle of chemotherapy and to be see by Dr. Lindi Adie on 11/12/2018.  2) Pruritis:  The patient was given a prescription for hydroxyzine 10 mg PO every night at bedtime PRN for pruritis.  She was told to not take Ambien with it.   Please see After Visit Summary for patient specific instructions.  Future Appointments  Date Time Provider [place]  11/12/2018 10:45 AM [facility] LAB 3 [facility] None  11/12/2018 11:00 AM [facility] FLUSH [facility] None  11/12/2018 11:30 AM Nicholas Lose, MD [facility] None  11/12/2018 12:30 PM [facility] INFUSION [facility] None  12/14/2018  1:00 PM [name], MD [facility]  12/17/2018 11:40 AM Larey Dresser, MD [facility] None  04/04/2019  1:40 PM [name], MD [facility]    No orders of the defined types were placed in this encounter.      Subjective:   Patient ID:  Tara [name] is a 73 y.o. (DOB 12-Aug-1946) female.  Chief Complaint:  Chief Complaint  Patient presents with  . Follow-up    HPI Tara [name] is a 73 y.o. female with an ER negative malignant neoplasm of the right breast who is managed by Dr. Lindi Adie.  She presents for Cycle 5 of CMF.  She has been having itching of her bilateral upper extremities and upper back.  She has had no change in  personal care products, soaps, or detergents.  She requests a prescription for hydroxyzine today.  Medications: I have reviewed the patient's current medications.  Allergies:  Allergies  Allergen Reactions  . Aspirin Other (See Comments)    Reports GI bleed--pt is currently taking  . Lisinopril Cough  . Sudafed  [Pseudoephedrine Hcl] Other (See Comments)    Sore throat  . Sudafed [Pseudoephedrine Hcl]     Pt reports she had drainage in throat that made her throat hurt.     Past Medical History:  Diagnosis Date  . Abnormal mammogram of right breast 07/29/2017  . 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 Anxiety   . Breast cancer [facility]) 2008   right - s/p lumpectomy->chemo, radiation  . Depression   . Dysrhythmia    hx SVT  . Family history of colon cancer   . Family history of prostate cancer   . GERD (gastroesophageal reflux disease)   . H/O: hysterectomy   . Hiatal hernia   . History of cancer chemotherapy   . History of radiation therapy   . Hyperlipidemia   . Hypertension 01/25/2018  . Nonischemic cardiomyopathy ([place])   . Personal history of radiation therapy 01/05/209  . SVT (supraventricular tachycardia) ([place])    short RP SVT documented 5/14  . 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[place])   . 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 ABDOMINAL HYSTERECTOMY  1994   fibroids,   . BIOPSY  06/19/2016   Procedure: BIOPSY;  Surgeon: Daneil Dolin, MD;  [facility];  Service: Endoscopy;;  gastric duodenum  . BREAST BIOPSY Right 12/16/2006   malignant  . BREAST EXCISIONAL BIOPSY Right 2018   benign lumpectomy  . BREAST LUMPECTOMY Right 02/2007  . BREAST LUMPECTOMY WITH RADIOACTIVE SEED LOCALIZATION Right 07/29/2017   Procedure: RIGHT BREAST LUMPECTOMY WITH RADIOACTIVE SEED LOCALIZATION;  Surgeon: Fanny Skates, MD;  Location: [place];  Service: General;  Laterality: Right;  . BREAST SURGERY Right 2008   lumpectomy, cancer  . CARDIAC CATHETERIZATION    . CHOLECYSTECTOMY  1999  . COLONOSCOPY  2008   Dr. Oneida Alar: multiple polyps. Path not available at time of visit.   Marland Kitchen COLONOSCOPY WITH PROPOFOL N/A 04/25/2014   Dr. Oneida Alar: Multiple tubular adenomas removed. Diverticulosis. Moderate internal hemorrhoids. Next colonoscopy planned for August 2018.  Marland Kitchen ESOPHAGOGASTRODUODENOSCOPY (EGD) WITH PROPOFOL N/A 06/19/2016   Procedure: ESOPHAGOGASTRODUODENOSCOPY (EGD) WITH PROPOFOL;  Surgeon: Daneil Dolin, MD;  [facility];  Service: Endoscopy;  Laterality: N/A;  . MASTECTOMY W/ SENTINEL NODE BIOPSY Right 06/09/2018   Procedure: RIGHT TOTAL MASTECTOMY WITH SENTINEL LYMPH NODE BIOPSY;  Surgeon: Fanny Skates, MD;  Location: [place];  Service: General;  Laterality: Right;  . POLYPECTOMY N/A 04/25/2014   Procedure: POLYPECTOMY;  Surgeon: Danie Binder, MD;  Location: [facility];  Service: Endoscopy;  Laterality: N/A;  Ascending and Decending Colon x3 , Transverse colon x2, rectal  . PORTACATH PLACEMENT N/A 06/09/2018   Procedure: INSERTION PORT-A-CATH;  Surgeon: Fanny Skates, MD;  Location: [place];  Service: General;  Laterality: N/A;  . RIGHT/LEFT HEART CATH AND CORONARY ANGIOGRAPHY N/A 07/10/2017   Procedure: RIGHT/LEFT HEART CATH AND CORONARY ANGIOGRAPHY;  Surgeon: Larey Dresser, MD;  Location: [place] [facility];  Service: Cardiovascular;  Laterality: N/A;    Family History  Problem Relation Age of Onset  . Colon cancer Father 62       died at age 23  . Hypertension Sister   . Heart disease Sister   . Cancer Sister        unknown form  . Dementia Mother   . Stroke Mother 41       left hemiparesis  . Cancer Brother 81       prostate  . Cancer Paternal Aunt        NOS  . Lung cancer Paternal Uncle   . Other Paternal Uncle        lightning strike  . Other Paternal Uncle        hit by train  . Other Paternal 17         old age  . Cancer Cousin        NOS pat first cousin  . Cancer Cousin        NOS pat first cousin  . Prostate cancer Cousin        pat first cousin    Social History   Socioeconomic History  . Marital status: Married    Spouse name: Not on file  . Number of children: 2  . Years of education: Not on file  . Highest education level: Not on file  Occupational History  . Occupation: disabled     Fish farm manager: UNEMPLOYED  Social Needs  .  Financial resource strain: Not hard at all  . Food insecurity:    Worry: Never true    Inability: Never true  . Transportation needs:    Medical: No    Non-medical: No  Tobacco Use  . Smoking status: Never Smoker  . Smokeless tobacco: Never Used  Substance and Sexual Activity  . Alcohol use: No  . Drug use: No  . Sexual activity: Not on file  Lifestyle  . Physical activity:    Days per week: 2 days    Minutes per session: 10 min  . Stress: Not at all  Relationships  . Social connections:    Talks on phone: More than three times a week    Gets together: More than three times a week    Attends religious service: More than 4 times per year    Active member of club or organization: No    Attends meetings of clubs or organizations: Not on file    Relationship status: Married  . Intimate partner violence:    Fear of current or ex partner: No    Emotionally abused: No    Physically abused: No    Forced sexual activity: No  Other Topics Concern  . Not on file  Social History Narrative   Lives in [place] with family.  Does not routinely exercise.    Past Medical History, Surgical history, Social history, and Family history were reviewed and updated as appropriate.   Please see review of systems for further details on the patient's review from today.   Review of Systems:  Review of Systems  Constitutional: Negative for chills, diaphoresis and fever.  HENT: Negative for trouble swallowing and voice change.   Respiratory:  Negative for cough, chest tightness, shortness of breath and wheezing.   Cardiovascular: Negative for chest pain and palpitations.  Gastrointestinal: Negative for abdominal pain, constipation, diarrhea, nausea and vomiting.  Genitourinary: Negative for difficulty urinating.  Musculoskeletal: Negative for back pain and myalgias.  Skin:       Itching of the arms and upper back  Neurological: Negative for dizziness, light-headedness and headaches.    Objective:   Physical Exam:  BP 117/78 (BP Location: Left Arm, Patient Position: Sitting)   Pulse 62   Temp 97.6 F (36.4 C) (Oral)   Resp 18   SpO2 96%  ECOG: 0  Physical Exam Constitutional:      General: She is not in acute distress.    Appearance: She is not diaphoretic.  HENT:     Head: Normocephalic and atraumatic.  Cardiovascular:     Rate and Rhythm: Normal rate and regular rhythm.     Heart sounds: Normal heart sounds. No murmur. No friction rub. No gallop.      Comments: A left chest wall Port-A-Cath is noted. Pulmonary:     Effort: Pulmonary effort is normal. No respiratory distress.     Breath sounds: Normal breath sounds. No wheezing or rales.  Skin:    General: Skin is warm and dry.     Findings: Erythema (There is mild erythema over the upper medial left arm and neck.) present. No rash.  Neurological:     Mental Status: She is alert.     Gait: Gait normal.  Psychiatric:        Mood and Affect: Mood normal.        Behavior: Behavior normal.        Thought Content: Thought content normal.        Judgment:  Judgment normal.     Lab Review:     Component Value Date/Time   NA 143 10/25/2018 1354   NA 142 10/20/2016 1015   NA 143 10/12/2014 1001   K 5.4 (H) 10/25/2018 1354   K 3.9 10/12/2014 1001   CL 112 (H) 10/25/2018 1354   CO2 23 10/25/2018 1354   CO2 25 10/12/2014 1001   GLUCOSE 103 (H) 10/25/2018 1354   GLUCOSE 105 10/12/2014 1001   BUN 24 (H) 10/25/2018 1354   BUN 60 (H) 10/20/2016 1015   BUN  13.9 10/12/2014 1001   CREATININE 1.48 (H) 10/25/2018 1354   CREATININE 1.68 (H) 08/05/2018 1434   CREATININE 1.0 10/12/2014 1001   CALCIUM 8.9 10/25/2018 1354   CALCIUM 8.6 10/12/2014 1001   PROT 7.3 10/25/2018 1354   PROT 8.2 10/20/2016 1015   PROT 7.4 10/12/2014 1001   ALBUMIN 3.4 (L) 10/25/2018 1354   ALBUMIN 3.5 10/12/2014 1001   AST 12 (L) 10/25/2018 1354   AST 33 10/12/2014 1001   ALT 7 10/25/2018 1354   ALT 27 10/12/2014 1001   ALKPHOS 120 10/25/2018 1354   ALKPHOS 102 10/12/2014 1001   BILITOT 0.4 10/25/2018 1354   BILITOT 0.25 10/12/2014 1001   GFRNONAA 35 (L) 10/25/2018 1354   GFRNONAA 30 (L) 08/05/2018 1434   GFRAA 41 (L) 10/25/2018 1354   GFRAA 35 (L) 08/05/2018 1434       Component Value Date/Time   WBC 8.0 10/25/2018 1354   WBC CANCELED 08/05/2018 1434   RBC 3.59 (L) 10/25/2018 1354   HGB 10.1 (L) 10/25/2018 1354   HGB 12.4 10/12/2014 1000   HCT 33.0 (L) 10/25/2018 1354   HCT 39.3 10/12/2014 1000   PLT 159 10/25/2018 1354   PLT 231 10/12/2014 1000   MCV 91.9 10/25/2018 1354   MCV 82.2 10/12/2014 1000   MCH 28.1 10/25/2018 1354   MCHC 30.6 10/25/2018 1354   RDW 18.5 (H) 10/25/2018 1354   RDW 15.5 (H) 10/12/2014 1000   LYMPHSABS 0.9 10/25/2018 1354   LYMPHSABS 2.2 10/12/2014 1000   MONOABS 0.9 10/25/2018 1354   MONOABS 0.4 10/12/2014 1000   EOSABS 0.1 10/25/2018 1354   EOSABS 0.8 (H) 10/12/2014 1000   BASOSABS 0.0 10/25/2018 1354   BASOSABS 0.0 10/12/2014 1000   -------------------------------  Imaging from last 24 hours (if applicable):  Radiology interpretation: No results found.    OK to treat pending CMP. Sandi Mealy

## 2018-10-25 NOTE — Progress Notes (Signed)
Pt accessed by LPN Santiago Glad, no blood return.  Labs drawn from peripheral stick in [place] by Ignatius Specking administered at [id number] by [employer].  Rechecked at 1512, still no blood return.  Pt waiting in subwait area with port accessed and family present.  Report given to Charge RN Amy.

## 2018-10-25 NOTE — Progress Notes (Signed)
Pt in infusion with rash and hives on left upper arm and forearm.  Sandi Mealy called to assess pt and per Sandi Mealy believes this is possibly from cathflo administration considering that is the only thing she has received from [place] today.  Orders received to administer 25 mg P.O benadryl.

## 2018-10-25 NOTE — Patient Instructions (Signed)

## 2018-10-26 ENCOUNTER — Other Ambulatory Visit (HOSPITAL_COMMUNITY): Payer: Self-pay | Admitting: Cardiology

## 2018-11-10 ENCOUNTER — Telehealth: Payer: Self-pay | Admitting: Hematology and Oncology

## 2018-11-10 NOTE — Telephone Encounter (Signed)
R/s apt per 2/19 sch message -unable to reach pt or leave message. Let RN know.

## 2018-11-12 ENCOUNTER — Inpatient Hospital Stay: Payer: Medicare Other

## 2018-11-12 ENCOUNTER — Inpatient Hospital Stay (HOSPITAL_BASED_OUTPATIENT_CLINIC_OR_DEPARTMENT_OTHER): Payer: Medicare Other | Admitting: Hematology and Oncology

## 2018-11-12 ENCOUNTER — Telehealth: Payer: Self-pay | Admitting: Hematology and Oncology

## 2018-11-12 ENCOUNTER — Inpatient Hospital Stay: Payer: Medicare Other | Admitting: Hematology and Oncology

## 2018-11-12 DIAGNOSIS — C50411 Malignant neoplasm of upper-outer quadrant of right female breast: Secondary | ICD-10-CM

## 2018-11-12 DIAGNOSIS — D701 Agranulocytosis secondary to cancer chemotherapy: Secondary | ICD-10-CM | POA: Diagnosis not present

## 2018-11-12 DIAGNOSIS — Z452 Encounter for adjustment and management of vascular access device: Secondary | ICD-10-CM | POA: Diagnosis not present

## 2018-11-12 DIAGNOSIS — D6481 Anemia due to antineoplastic chemotherapy: Secondary | ICD-10-CM

## 2018-11-12 DIAGNOSIS — Z95828 Presence of other vascular implants and grafts: Secondary | ICD-10-CM

## 2018-11-12 DIAGNOSIS — L299 Pruritus, unspecified: Secondary | ICD-10-CM | POA: Diagnosis not present

## 2018-11-12 DIAGNOSIS — Z5111 Encounter for antineoplastic chemotherapy: Secondary | ICD-10-CM | POA: Diagnosis not present

## 2018-11-12 DIAGNOSIS — C50811 Malignant neoplasm of overlapping sites of right female breast: Secondary | ICD-10-CM

## 2018-11-12 DIAGNOSIS — Z171 Estrogen receptor negative status [ER-]: Secondary | ICD-10-CM | POA: Diagnosis not present

## 2018-11-12 LAB — CMP (CANCER CENTER ONLY)
ALT: 6 U/L (ref 0–44)
AST: 10 U/L — AB (ref 15–41)
Albumin: 3.4 g/dL — ABNORMAL LOW (ref 3.5–5.0)
Alkaline Phosphatase: 107 U/L (ref 38–126)
Anion gap: 12 (ref 5–15)
BUN: 32 mg/dL — AB (ref 8–23)
CHLORIDE: 111 mmol/L (ref 98–111)
CO2: 17 mmol/L — ABNORMAL LOW (ref 22–32)
Calcium: 8.7 mg/dL — ABNORMAL LOW (ref 8.9–10.3)
Creatinine: 1.63 mg/dL — ABNORMAL HIGH (ref 0.44–1.00)
GFR, Est AFR Am: 36 mL/min — ABNORMAL LOW (ref >60)
GFR, Estimated: 31 mL/min — ABNORMAL LOW (ref >60)
Glucose, Bld: 101 mg/dL — ABNORMAL HIGH (ref 70–99)
Potassium: 4.8 mmol/L (ref 3.5–5.1)
Sodium: 140 mmol/L (ref 135–145)
Total Bilirubin: <0.2 mg/dL — ABNORMAL LOW (ref 0.3–1.2)
Total Protein: 7.1 g/dL (ref 6.5–8.1)

## 2018-11-12 LAB — CBC WITH DIFFERENTIAL (CANCER CENTER ONLY)
ABS IMMATURE GRANULOCYTES: 0.01 10*3/uL (ref 0.00–0.07)
BASOS PCT: 1 %
Basophils Absolute: 0 10*3/uL (ref 0.0–0.1)
EOS ABS: 0.2 10*3/uL (ref 0.0–0.5)
Eosinophils Relative: 6 %
HCT: 31.1 % — ABNORMAL LOW (ref 36.0–46.0)
Hemoglobin: 9.6 g/dL — ABNORMAL LOW (ref 12.0–15.0)
IMMATURE GRANULOCYTES: 0 %
Lymphocytes Relative: 16 %
Lymphs Abs: 0.5 10*3/uL — ABNORMAL LOW (ref 0.7–4.0)
MCH: 28.7 pg (ref 26.0–34.0)
MCHC: 30.9 g/dL (ref 30.0–36.0)
MCV: 92.8 fL (ref 80.0–100.0)
Monocytes Absolute: 0.7 10*3/uL (ref 0.1–1.0)
Monocytes Relative: 23 %
Neutro Abs: 1.6 10*3/uL — ABNORMAL LOW (ref 1.7–7.7)
Neutrophils Relative %: 54 %
Platelet Count: 162 10*3/uL (ref 150–400)
RBC: 3.35 MIL/uL — ABNORMAL LOW (ref 3.87–5.11)
RDW: 17 % — ABNORMAL HIGH (ref 11.5–15.5)
WBC: 3 10*3/uL — AB (ref 4.0–10.5)
nRBC: 0 % (ref 0.0–0.2)

## 2018-11-12 MED ORDER — SODIUM CHLORIDE 0.9 % IV SOLN
Freq: Once | INTRAVENOUS | Status: AC
  Administered 2018-11-12: 12:00:00 via INTRAVENOUS
  Filled 2018-11-12: qty 250

## 2018-11-12 MED ORDER — PALONOSETRON HCL INJECTION 0.25 MG/5ML
INTRAVENOUS | Status: AC
  Filled 2018-11-12: qty 5

## 2018-11-12 MED ORDER — SODIUM CHLORIDE 0.9% FLUSH
10.0000 mL | INTRAVENOUS | Status: DC | PRN
  Administered 2018-11-12: 10 mL
  Filled 2018-11-12: qty 10

## 2018-11-12 MED ORDER — HEPARIN SOD (PORK) LOCK FLUSH 100 UNIT/ML IV SOLN
500.0000 [IU] | Freq: Once | INTRAVENOUS | Status: AC | PRN
  Administered 2018-11-12: 500 [IU]
  Filled 2018-11-12: qty 5

## 2018-11-12 MED ORDER — SODIUM CHLORIDE 0.9 % IV SOLN
600.0000 mg/m2 | Freq: Once | INTRAVENOUS | Status: AC
  Administered 2018-11-12: 1120 mg via INTRAVENOUS
  Filled 2018-11-12: qty 56

## 2018-11-12 MED ORDER — FAMOTIDINE IN NACL 20-0.9 MG/50ML-% IV SOLN: 20.0000 mg | Freq: Once | INTRAVENOUS | Status: DC

## 2018-11-12 MED ORDER — METHOTREXATE SODIUM (PF) CHEMO INJECTION 250 MG/10ML
39.9000 mg/m2 | Freq: Once | INTRAMUSCULAR | Status: AC
  Administered 2018-11-12: 75 mg via INTRAVENOUS
  Filled 2018-11-12: qty 3

## 2018-11-12 MED ORDER — SODIUM CHLORIDE 0.9 % IV SOLN
20.0000 mg | Freq: Once | INTRAVENOUS | Status: AC
  Administered 2018-11-12: 20 mg via INTRAVENOUS
  Filled 2018-11-12: qty 2

## 2018-11-12 MED ORDER — DIPHENHYDRAMINE HCL 50 MG/ML IJ SOLN
25.0000 mg | Freq: Once | INTRAMUSCULAR | Status: AC
  Administered 2018-11-12: 25 mg via INTRAVENOUS

## 2018-11-12 MED ORDER — SODIUM CHLORIDE 0.9% FLUSH
10.0000 mL | Freq: Once | INTRAVENOUS | Status: AC
  Administered 2018-11-12: 10 mL
  Filled 2018-11-12: qty 10

## 2018-11-12 MED ORDER — DIPHENHYDRAMINE HCL 50 MG/ML IJ SOLN
INTRAMUSCULAR | Status: AC
  Filled 2018-11-12: qty 1

## 2018-11-12 MED ORDER — FLUOROURACIL CHEMO INJECTION 2.5 GM/50ML
600.0000 mg/m2 | Freq: Once | INTRAVENOUS | Status: AC
  Administered 2018-11-12: 1150 mg via INTRAVENOUS
  Filled 2018-11-12: qty 23

## 2018-11-12 MED ORDER — FAMOTIDINE IN NACL 20-0.9 MG/50ML-% IV SOLN
INTRAVENOUS | Status: AC
  Filled 2018-11-12: qty 50

## 2018-11-12 MED ORDER — PALONOSETRON HCL INJECTION 0.25 MG/5ML
0.2500 mg | Freq: Once | INTRAVENOUS | Status: AC
  Administered 2018-11-12: 0.25 mg via INTRAVENOUS

## 2018-11-12 NOTE — Patient Instructions (Signed)
Tara [name] Discharge Instructions for Patients Receiving Chemotherapy  Today you received the following chemotherapy agents: Cyclophosphamide (Cytoxan), Methotrexate (PF), Fluorouracil (Adrucil, 5-FU)  To help prevent nausea and vomiting after your treatment, we encourage you to take your nausea medication as directed.    If you develop nausea and vomiting that is not controlled by your nausea medication, call the clinic.   BELOW ARE SYMPTOMS THAT SHOULD BE REPORTED IMMEDIATELY:  *FEVER GREATER THAN 100.5 F  *CHILLS WITH OR WITHOUT FEVER  NAUSEA AND VOMITING THAT IS NOT CONTROLLED WITH YOUR NAUSEA MEDICATION  *UNUSUAL SHORTNESS OF BREATH  *UNUSUAL BRUISING OR BLEEDING  TENDERNESS IN MOUTH AND THROAT WITH OR WITHOUT PRESENCE OF ULCERS  *URINARY PROBLEMS  *BOWEL PROBLEMS  UNUSUAL RASH Items with * indicate a potential emergency and should be followed up as soon as possible.  Feel free to call the clinic should you have any questions or concerns. The clinic phone number is [phone].  Please show the [place] at check-in to the Emergency Department and triage nurse.

## 2018-11-12 NOTE — Progress Notes (Signed)
Patient Care Team: [name], MD as PCP - Bethena Roys, MD as Consulting Physician (Cardiology) Cottle, Billey Co., MD as Attending Physician (Psychiatry)  DIAGNOSIS:    ICD-10-CM   1. Malignant neoplasm of upper-outer quadrant of right breast in female, estrogen receptor negative ([place]) C50.411    Z17.1     SUMMARY OF ONCOLOGIC HISTORY:   Malignant neoplasm of right breast ([place])   02/03/2007 Surgery    Right breast lumpectomy: Invasive ductal carcinoma 1.5 cm grade 3, 1/2 sentinel nodes positive, ER negative, PR negative, Ki-67 35%, HER-2 negative    03/08/2007 Surgery    Right axillary lymph node dissection: 2/8 lymph nodes positive    04/12/2007 - 08/13/2007 Chemotherapy    FEC and Taxotere    09/20/2007 - 11/15/2007 Radiation Therapy    Adjuvant radiation    04/01/2018 Relapse/Recurrence    3 irregular masses right breast UOQ.  Ultrasound reveals 0.9 cm, 1.3 cm, 1 cm no abnormal lymph nodes, right breast biopsy 10 o'clock position: IDC grade 3, ER 0%, PR 0%, Ki-67 60%, HER-2 negative ratio 1.74    06/09/2018 Surgery    Right mastectomy: Grade 3 IDC, 1.2 cm, high-grade DCIS, margins negative, negative for lymphovascular or perineural invasion, ER 0%, PR 0%, HER-2 negative, Ki-67 60%    07/16/2018 - 11/12/2018 Chemotherapy    CMF adjuvant chemotherapy for 6 cycles      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 ([facility])    CHIEF COMPLIANT: Cycle 6 of CMF  INTERVAL HISTORY: Tara [name] is a 73 y.o. with above-mentioned history of triple negative right breast cancer treated with mastectomy and is here today for her final cycle of adjuvant chemotherapy with CMF. She presents to the clinic today with her husband. She had a rash on her left arm following treatment that improved with benadryl before leaving the clinic. She reports diarrhea 2-3 days after treatment lasting 2-3 days for which Imodium is helplful. Her labs from today show: WBC 3.0, Hg 9.6,  platelets 162, ANC 1.6. She reviewed her medication list with me.   REVIEW OF SYSTEMS:   Constitutional: Denies fevers, chills or abnormal weight loss Eyes: Denies blurriness of vision Ears, nose, mouth, throat, and face: Denies mucositis or sore throat Respiratory: Denies cough, dyspnea or wheezes Cardiovascular: Denies palpitation, chest discomfort Gastrointestinal: Denies nausea, heartburn (+) diarrhea Skin: (+) rash on left arm Lymphatics: Denies new lymphadenopathy or easy bruising Neurological: Denies numbness, tingling or new weaknesses Behavioral/Psych: Mood is stable, no new changes  Extremities: No lower extremity edema Breast: denies any pain or lumps or nodules in either breasts All other systems were reviewed with the patient and are negative.  I have reviewed the past medical history, past surgical history, social history and family history with the patient and they are unchanged from previous note.  ALLERGIES:  is allergic to aspirin; lisinopril; sudafed  [pseudoephedrine hcl]; and sudafed [pseudoephedrine hcl].  MEDICATIONS:  Current Outpatient Medications  Medication Sig Dispense Refill  . acetaminophen (TYLENOL) 500 MG tablet Take 500-1,000 mg by mouth every 6 (six) hours as needed (FOR PAIN.).    Marland Kitchen aspirin EC 81 MG tablet Take 81 mg by mouth daily.     Marland Kitchen azelastine (ASTELIN) 0.1 % nasal spray Place 2 sprays into both nostrils 2 (two) times daily. Use in each nostril as directed (Patient taking differently: Place 2 sprays into both nostrils 2 (two) times daily as needed (cold symptoms). Use in each nostril as  directed) 30 mL 12  . clonazePAM (KLONOPIN) 0.5 MG tablet TAKE (1) TABLET BY MOUTH AT BEDTIME. 90 tablet 0  . docusate sodium (COLACE) 100 MG capsule Take 200 mg by mouth 2 (two) times daily.     Marland Kitchen ENTRESTO 24-26 MG TAKE (1) TABLET BY MOUTH TWICE DAILY. 60 tablet 0  . folic acid (FOLVITE) 1 MG tablet TAKE ONE TABLET BY MOUTH DAILY. 100 tablet 0  . lovastatin  (MEVACOR) 40 MG tablet TAKE 1 TABLET BY MOUTH AT BEDTIME 90 tablet 2  . metoCLOPramide (REGLAN) 10 MG tablet TAKE ONE TABLET BY MOUTH TWICE DAILY. 180 tablet 0  . metoprolol succinate (TOPROL-XL) 50 MG 24 hr tablet TAKE 1 TABLET BY MOUTH IN THE MORNING AND 1/2 TABLET IN THE EVENING. 45 tablet 0  . montelukast (SINGULAIR) 10 MG tablet TAKE ONE TABLET BY MOUTH AT BEDTIME. 30 tablet 5  . pantoprazole (PROTONIX) 40 MG tablet TAKE (1) TABLET BY MOUTH TWICE DAILY. 180 tablet 0  . sertraline (ZOLOFT) 50 MG tablet TAKE 1 TABLET BY MOUTH ONCE A DAY. 90 tablet 0  . spironolactone (ALDACTONE) 25 MG tablet TAKE 1 TABLET BY MOUTH ONCE A DAY. (Patient taking differently: Take 25 mg by mouth daily. ) 30 tablet 12  . zolpidem (AMBIEN) 10 MG tablet TAKE 1 TABLET BY MOUTH AT BEDTIME AS NEEDED FOR SLEEP. 30 tablet 3   No current facility-administered medications for this visit.     PHYSICAL EXAMINATION: ECOG PERFORMANCE STATUS: 1 - Symptomatic but completely ambulatory  Vitals:   11/12/18 1052  BP: (!) 101/56  Pulse: 60  Resp: 17  Temp: 98 F (36.7 C)  SpO2: 94%   Filed Weights   11/12/18 1052  Weight: 190 lb (86.2 kg)    GENERAL: alert, no distress and comfortable SKIN: skin color, texture, turgor are normal, no rashes or significant lesions EYES: normal, Conjunctiva are pink and non-injected, sclera clear OROPHARYNX: no exudate, no erythema and lips, buccal mucosa, and tongue normal  NECK: supple, thyroid normal size, non-tender, without nodularity LYMPH: no palpable lymphadenopathy in the cervical, axillary or inguinal LUNGS: clear to auscultation and percussion with normal breathing effort HEART: regular rate & rhythm and no murmurs and no lower extremity edema ABDOMEN: abdomen soft, non-tender and normal bowel sounds MUSCULOSKELETAL: no cyanosis of digits and no clubbing  NEURO: alert & oriented x 3 with fluent speech, no focal motor/sensory deficits EXTREMITIES: No lower extremity  edema  LABORATORY DATA:  I have reviewed the data as listed CMP Latest Ref Rng & Units 10/25/2018 09/28/2018 08/27/2018  Glucose 70 - 99 mg/dL 103(H) 114(H) 110(H)  BUN 8 - 23 mg/dL 24(H) 49(H) 20  Creatinine 0.44 - 1.00 mg/dL 1.48(H) 1.47(H) 1.55(H)  Sodium 135 - 145 mmol/L 143 143 140  Potassium 3.5 - 5.1 mmol/L 5.4(H) 4.4 4.7  Chloride 98 - 111 mmol/L 112(H) 113(H) 109  CO2 22 - 32 mmol/L 23 19(L) 20(L)  Calcium 8.9 - 10.3 mg/dL 8.9 8.9 8.4(L)  Total Protein 6.5 - 8.1 g/dL 7.3 7.1 6.9  Total Bilirubin 0.3 - 1.2 mg/dL 0.4 0.4 0.2(L)  Alkaline Phos 38 - 126 U/L 120 124 132(H)  AST 15 - 41 U/L 12(L) 10(L) 11(L)  ALT 0 - 44 U/L '7 8 7    '$ Lab Results  Component Value Date   WBC 3.0 (L) 11/12/2018   HGB 9.6 (L) 11/12/2018   HCT 31.1 (L) 11/12/2018   MCV 92.8 11/12/2018   PLT 162 11/12/2018   NEUTROABS 1.6 (  L) 11/12/2018    ASSESSMENT & PLAN:  Malignant neoplasm of right breast ([place]) Original cancer diagnosed 2008 treated with lumpectomy, 2/8 lymph nodes positive, triple negative, adjuvant chemo FEC Taxotere, radiation  06/09/2018 right mastectomy: Grade 3 IDC, 1.2 cm, high-grade DCIS, margins negative, negative for lymphovascular or perineural invasion, ER 0%, PR 0%, HER-2 negative, Ki-67 60%  Current treatment: Adjuvant CMF chemotherapy started10/25/2019, today's cycle6day 1  Chemo toxicities:Patient does not have any nausea vomiting Diarrhea: Postchemotherapy diarrhea that lasted for couple of days. Chemotherapy-induced anemia: Hemoglobin 9.6 g being monitored closely Chemotherapy-induced neutropenia: ANC 1.6 today.  Skin reaction: We will give her Benadryl premedication. I sent a message to Dr. Dalbert Batman to remove the port. Monitoring closely for toxicities Return to clinic in 3 months with labs and follow-up    No orders of the defined types were placed in this encounter.  The patient has a good understanding of the overall plan. she agrees with it. she will call  with any problems that may develop before the next visit here.  Nicholas Lose, MD 11/12/2018  Julious Oka Dorshimer am acting as scribe for Dr. Nicholas Lose.  I have reviewed the above documentation for accuracy and completeness, and I agree with the above.

## 2018-11-12 NOTE — Telephone Encounter (Signed)
Gave avs and calendar ° °

## 2018-11-12 NOTE — Progress Notes (Signed)
Okay to treat with creatinine of 1.63 today per Dr. Lindi Adie.

## 2018-11-12 NOTE — Assessment & Plan Note (Signed)
Original cancer diagnosed 2008 treated with lumpectomy, 2/8 lymph nodes positive, triple negative, adjuvant chemo FEC Taxotere, radiation  06/09/2018 right mastectomy: Grade 3 IDC, 1.2 cm, high-grade DCIS, margins negative, negative for lymphovascular or perineural invasion, ER 0%, PR 0%, HER-2 negative, Ki-67 60%  Current treatment: Adjuvant CMF chemotherapy started10/25/2019, today's cycle6day 1  Chemo toxicities:Patient does not have any nausea vomiting Diarrhea: No further diarrhea. Chemotherapy-induced anemia: Hemoglobin 10 being monitored closely  Skin reaction: We will give her Benadryl premedication.  Monitoring closely for toxicities Return to clinic in 3 months with labs and follow-up

## 2018-11-18 ENCOUNTER — Ambulatory Visit: Payer: Medicare Other

## 2018-11-18 ENCOUNTER — Ambulatory Visit: Payer: Medicare Other | Admitting: Hematology and Oncology

## 2018-11-18 ENCOUNTER — Other Ambulatory Visit: Payer: Medicare Other

## 2018-11-19 ENCOUNTER — Other Ambulatory Visit (HOSPITAL_COMMUNITY): Payer: Self-pay | Admitting: Cardiology

## 2018-11-26 ENCOUNTER — Other Ambulatory Visit (HOSPITAL_COMMUNITY): Payer: Self-pay | Admitting: Cardiology

## 2018-11-29 DIAGNOSIS — Z853 Personal history of malignant neoplasm of breast: Secondary | ICD-10-CM | POA: Diagnosis not present

## 2018-11-29 DIAGNOSIS — Z452 Encounter for adjustment and management of vascular access device: Secondary | ICD-10-CM | POA: Diagnosis not present

## 2018-12-13 ENCOUNTER — Other Ambulatory Visit: Payer: Self-pay

## 2018-12-13 ENCOUNTER — Telehealth: Payer: Self-pay | Admitting: *Deleted

## 2018-12-13 MED ORDER — METOCLOPRAMIDE HCL 10 MG PO TABS: 10.0000 mg | ORAL_TABLET | Freq: Two times a day (BID) | ORAL | 1 refills | Status: DC

## 2018-12-13 MED ORDER — CLONAZEPAM 0.5 MG PO TABS: ORAL_TABLET | ORAL | 1 refills | Status: DC

## 2018-12-13 MED ORDER — ZOLPIDEM TARTRATE 10 MG PO TABS: 10.0000 mg | ORAL_TABLET | Freq: Every evening | ORAL | 3 refills | Status: DC | PRN

## 2018-12-13 MED ORDER — PANTOPRAZOLE SODIUM 40 MG PO TBEC: DELAYED_RELEASE_TABLET | ORAL | 1 refills | Status: DC

## 2018-12-13 NOTE — Telephone Encounter (Signed)
Refills sent

## 2018-12-13 NOTE — Telephone Encounter (Signed)
Pt called needing refills on her zoloft pantaprazole clonzepam and reglan said she called them into pharmacy but as of Friday there were no refill sent

## 2018-12-14 ENCOUNTER — Ambulatory Visit: Payer: Medicare Other | Admitting: Family Medicine

## 2018-12-17 ENCOUNTER — Encounter (HOSPITAL_COMMUNITY): Payer: Self-pay | Admitting: Cardiology

## 2018-12-17 ENCOUNTER — Ambulatory Visit (HOSPITAL_COMMUNITY): Admission: RE | Admit: 2018-12-17 | Payer: Medicare Other | Source: Ambulatory Visit | Admitting: Cardiology

## 2018-12-17 ENCOUNTER — Encounter (HOSPITAL_COMMUNITY): Payer: Medicare Other | Admitting: Cardiology

## 2018-12-17 ENCOUNTER — Other Ambulatory Visit: Payer: Self-pay

## 2018-12-17 ENCOUNTER — Telehealth (HOSPITAL_COMMUNITY): Payer: Self-pay | Admitting: Cardiology

## 2018-12-17 NOTE — Telephone Encounter (Signed)
Returned call to patient and advised to NOT come into office for an appt. At this time we are seeing patient virtually. Given option to change appt to WebEx appt or a tele health appt. Pt report she will not be able to set up apps needed to complete a virtual visit on her smart phone, would like a TeleHealth visit  Advised I would change appt for her and office staff would reach out to her 10-15 mins prior to her appt to prepare her for a visit with Dr Aundra Dubin.

## 2018-12-17 NOTE — Telephone Encounter (Signed)
Patient left message on the triage line to inquire about coming into office for her appt 3/27 @1140   Will advise patient she DOES NOT need to come in for an appt, will change appt to a virtual visit only  No answer, unable to leave message as voice mail is full.

## 2018-12-20 ENCOUNTER — Other Ambulatory Visit (HOSPITAL_COMMUNITY): Payer: Self-pay | Admitting: Cardiology

## 2018-12-21 ENCOUNTER — Other Ambulatory Visit: Payer: Self-pay | Admitting: Family Medicine

## 2018-12-23 ENCOUNTER — Encounter: Payer: Self-pay | Admitting: Family Medicine

## 2018-12-23 ENCOUNTER — Other Ambulatory Visit: Payer: Self-pay

## 2018-12-23 ENCOUNTER — Ambulatory Visit (INDEPENDENT_AMBULATORY_CARE_PROVIDER_SITE_OTHER): Payer: Medicare Other | Admitting: Family Medicine

## 2018-12-23 VITALS — BP 110/80 | Ht 59.0 in | Wt 189.0 lb

## 2018-12-23 DIAGNOSIS — J302 Other seasonal allergic rhinitis: Secondary | ICD-10-CM

## 2018-12-23 DIAGNOSIS — E7800 Pure hypercholesterolemia, unspecified: Secondary | ICD-10-CM

## 2018-12-23 DIAGNOSIS — K219 Gastro-esophageal reflux disease without esophagitis: Secondary | ICD-10-CM

## 2018-12-23 DIAGNOSIS — F321 Major depressive disorder, single episode, moderate: Secondary | ICD-10-CM

## 2018-12-23 DIAGNOSIS — I10 Essential (primary) hypertension: Secondary | ICD-10-CM | POA: Diagnosis not present

## 2018-12-23 DIAGNOSIS — I428 Other cardiomyopathies: Secondary | ICD-10-CM

## 2018-12-23 DIAGNOSIS — F5105 Insomnia due to other mental disorder: Secondary | ICD-10-CM

## 2018-12-23 MED ORDER — SERTRALINE HCL 25 MG PO TABS: 25.0000 mg | ORAL_TABLET | Freq: Every day | ORAL | 4 refills | Status: DC

## 2018-12-23 NOTE — Assessment & Plan Note (Signed)
Denies any symptoms of decompensation as far as heart failure is concerned currently

## 2018-12-23 NOTE — Assessment & Plan Note (Signed)
Markedly improved , reduce dose of sertraline, continue klonopin as before

## 2018-12-23 NOTE — Assessment & Plan Note (Signed)
Controlled, no change in medication DASH diet and commitment to daily physical activity for a minimum of 30 minutes discussed and encouraged, as a part of hypertension management. The importance of attaining a healthy weight is also discussed.  BP/Weight 12/23/2018 11/12/2018 10/25/2018 09/28/2018 09/27/2018 08/27/2018 94/83/4758  Systolic BP 307 460 029 99 847 308 89  Diastolic BP 80 56 78 56 64 57 39  Wt. (Lbs) 189 190 - 187.8 186.8 189.7 -  BMI 38.17 38.38 - 37.93 40.42 41.05 -

## 2018-12-23 NOTE — Assessment & Plan Note (Signed)
Increased at this time , as expected but adequately controlled

## 2018-12-23 NOTE — Assessment & Plan Note (Signed)
Obesity associated with hypertension, hyperlipidemia , depression  Patient re-educated about  the importance of commitment to a  minimum of 150 minutes of exercise per week as able.  The importance of healthy food choices with portion control discussed, as well as eating regularly and within a 12 hour window most days. The need to choose "clean , green" food 50 to 75% of the time is discussed, as well as to make water the primary drink and set a goal of 64 ounces water daily.  Encouraged to start a food diary,  and to consider  joining a support group. Sample diet sheets offered. Goals set by the patient for the next several months.   Weight /BMI 12/23/2018 11/12/2018 09/28/2018  WEIGHT 189 lb 190 lb 187 lb 12.8 oz  HEIGHT 4\' 11"  4\' 11"  4\' 11"   BMI 38.17 kg/m2 38.38 kg/m2 37.93 kg/m2

## 2018-12-23 NOTE — Progress Notes (Signed)
Virtual Visit via Telephone Note  I connected with Tara [name] on 12/23/18 at  2:20 PM EDT by telephone and verified that I am speaking with the correct person using two identifiers.   I discussed the limitations, risks, security and privacy concerns of performing an evaluation and management service by telephone and the availability of in person appointments. I also discussed with the patient that there may be a patient responsible charge related to this service. The patient expressed understanding and agreed to proceed. Patient is in her  Home and I am at my house, no face to face contact available   History of Present Illness: F/u chronic problems and review and update on any recent consultations Denies recent fever or chills. Denies sinus pressure, nasal congestion, ear pain or sore throat. Denies chest congestion, productive cough or wheezing. Denies chest pains, palpitations and leg swelling, no PND or orthopnea Denies abdominal pain, nausea, vomiting,diarrhea or constipation.   Denies dysuria, frequency, hesitancy or incontinence. Denies joint pain, swelling and limitation in mobility. Denies headaches, seizures, numbness, or tingling. Denies depression, anxiety or insomnia. Denies skin break down or rash. Walks in the home fore 10 minutes and is working on [employer] choice. No weight loss that she is aware of however      Observations/Objective: BP 110/80 Comment: from chart review  Ht 4\' 11"  (1.499 m)   Wt 189 lb (85.7 kg)   BMI 38.17 kg/m    Assessment and Plan:  Depression, major, single episode, moderate (HCC) Markedly improved , reduce dose of sertraline, continue klonopin as before  Hypertension Controlled, no change in medication DASH diet and commitment to daily physical activity for a minimum of 30 minutes discussed and encouraged, as a part of hypertension management. The importance of attaining a healthy weight is also discussed.  BP/Weight 12/23/2018  11/12/2018 10/25/2018 09/28/2018 09/27/2018 08/27/2018 11/57/2620  Systolic BP 355 974 163 99 845 364 89  Diastolic BP 80 56 78 56 64 57 39  Wt. (Lbs) 189 190 - 187.8 186.8 189.7 -  BMI 38.17 38.38 - 37.93 40.42 41.05 -       Morbid obesity (HCC) Obesity associated with hypertension, hyperlipidemia , depression  Patient re-educated about  the importance of commitment to a  minimum of 150 minutes of exercise per week as able.  The importance of healthy food choices with portion control discussed, as well as eating regularly and within a 12 hour window most days. The need to choose "clean , green" food 50 to 75% of the time is discussed, as well as to make water the primary drink and set a goal of 64 ounces water daily.  Encouraged to start a food diary,  and to consider  joining a support group. Sample diet sheets offered. Goals set by the patient for the next several months.   Weight /BMI 12/23/2018 11/12/2018 09/28/2018  WEIGHT 189 lb 190 lb 187 lb 12.8 oz  HEIGHT 4\' 11"  4\' 11"  4\' 11"   BMI 38.17 kg/m2 38.38 kg/m2 37.93 kg/m2      Seasonal allergies Increased at this time , as expected but adequately controlled  Nonischemic cardiomyopathy ([place]) Denies any symptoms of decompensation as far as heart failure is concerned currently  GERD Controlled, no change in medication, encouraged weight loss to improve symptoms   Insomnia related to another mental disorder Controlled, no change in medication Sleep hygiene reviewed and written information offered also. Prescription sent for  medication needed.     Follow Up  Instructions:    I discussed the assessment and treatment plan with the patient. The patient was provided an opportunity to ask questions and all were answered. The patient agreed with the plan and demonstrated an understanding of the instructions.   The patient was advised to call back or seek an in-person evaluation if the symptoms worsen or if the condition fails to  improve as anticipated.  I provided 22  minutes of non-face-to-face time during this encounter.   [name], MD

## 2018-12-23 NOTE — Assessment & Plan Note (Addendum)
Controlled, no change in medication, encouraged weight loss to improve symptoms

## 2018-12-23 NOTE — Patient Instructions (Addendum)
Wellness with Nurse Practioner ( if [facility] approves) or MD due  July 10 or after  Annual exam with MD 11/15 or after, call if you need me before   Fasting lipid and cmp and EGFr first week in June please  Reduced dose of sertarline to 25 mg one daily is prescribed, please commit to taking every day for best management of anxiety and depression, thankful you are much better   No other medication changes Social distancing. Frequent hand washing with soap and water Keeping your hands off of your face. These 3 practices will help to keep both you and your community healthy during this time. Please practice them faithfully!   It is important that you exercise regularly at least 30 minutes 5 times a week. If you develop chest pain, have severe difficulty breathing, or feel very tired, stop exercising immediately and seek medical attention   Think about what you will eat, plan ahead. Choose " clean, green, fresh or frozen" over canned, processed or packaged foods which are more sugary, salty and fatty. 70 to 75% of food eaten should be vegetables and fruit. Three meals at set times with snacks allowed between meals, but they must be fruit or vegetables. Aim to eat over a 12 hour period , example 7 am to 7 pm, and STOP after  your last meal of the day. Drink water,generally about 64 ounces per day, no other drink is as healthy. Fruit juice is best enjoyed in a healthy way, by EATING the fruit.   Thanks for choosing [facility], we consider it a privelige to serve you.

## 2018-12-23 NOTE — Assessment & Plan Note (Signed)
Controlled, no change in medication Sleep hygiene reviewed and written information offered also. Prescription sent for  medication needed.  

## 2018-12-24 NOTE — Addendum Note (Signed)
Addended by: Eual Fines on: 12/24/2018 08:31 AM   Modules accepted: Orders

## 2018-12-28 ENCOUNTER — Other Ambulatory Visit: Payer: Self-pay | Admitting: Oncology

## 2018-12-28 ENCOUNTER — Other Ambulatory Visit (HOSPITAL_COMMUNITY): Payer: Self-pay | Admitting: Cardiology

## 2019-01-11 ENCOUNTER — Other Ambulatory Visit (HOSPITAL_COMMUNITY): Payer: Self-pay | Admitting: Cardiology

## 2019-01-11 ENCOUNTER — Other Ambulatory Visit: Payer: Self-pay | Admitting: Oncology

## 2019-01-17 ENCOUNTER — Other Ambulatory Visit (HOSPITAL_COMMUNITY): Payer: Self-pay | Admitting: Cardiology

## 2019-02-04 NOTE — Assessment & Plan Note (Signed)
Original cancer diagnosed 2008 treated with lumpectomy, 2/8 lymph nodes positive, triple negative, adjuvant chemo FEC Taxotere, radiation  06/09/2018 right mastectomy: Grade 3 IDC, 1.2 cm, high-grade DCIS, margins negative, negative for lymphovascular or perineural invasion, ER 0%, PR 0%, HER-2 negative, Ki-67 60%  Treatment summary: Adjuvant CMF chemotherapy x6 cycles 07/16/2018-11/12/2018 Current treatment: Surveillance

## 2019-02-08 ENCOUNTER — Telehealth: Payer: Self-pay | Admitting: Hematology and Oncology

## 2019-02-08 NOTE — Telephone Encounter (Signed)
Spoke with patient and she agrees to convert her office visit for 02/10/19 into a phone call. Pt aware to be near her phone prior to appt time.

## 2019-02-09 NOTE — Progress Notes (Signed)
  HEMATOLOGY-ONCOLOGY TELEPHONE VISIT PROGRESS NOTE  I connected with Tara [name] on 02/10/2019 at 11:45 AM EDT by telephone and verified that I am speaking with the correct person using two identifiers.  I discussed the limitations, risks, security and privacy concerns of performing an evaluation and management service by telephone and the availability of in person appointments.  I also discussed with the patient that there may be a patient responsible charge related to this service. The patient expressed understanding and agreed to proceed.   History of Present Illness: Tara [name] is a 73 y.o. 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 She presents to the clinic today for 3 month follow-up.  Shes recovering from chemo effects. Tired more often.    Malignant neoplasm of right breast ([place])   02/03/2007 Surgery    Right breast lumpectomy: Invasive ductal carcinoma 1.5 cm grade 3, 1/2 sentinel nodes positive, ER negative, PR negative, Ki-67 35%, HER-2 negative    03/08/2007 Surgery    Right axillary lymph node dissection: 2/8 lymph nodes positive    04/12/2007 - 08/13/2007 Chemotherapy    FEC and Taxotere    09/20/2007 - 11/15/2007 Radiation Therapy    Adjuvant radiation    04/01/2018 Relapse/Recurrence    3 irregular masses right breast UOQ.  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   Assessment Plan:  Malignant neoplasm of right breast ([place]) Original cancer diagnosed 2008 treated with lumpectomy, 2/8 lymph nodes positive, triple negative, adjuvant chemo FEC Taxotere, radiation  06/09/2018 right mastectomy: Grade 3 IDC, 1.2 cm, high-grade DCIS, margins negative, negative for lymphovascular or perineural invasion, ER 0%, PR 0%, HER-2 negative, Ki-67 60%  Treatment summary: Adjuvant CMF chemotherapy x6 cycles 07/16/2018-11/12/2018 Current treatment: Surveillance  I discussed the assessment and treatment plan with the patient. The patient was provided an opportunity to ask questions and all were answered. The patient agreed with the plan and demonstrated an understanding of the instructions. The patient was advised to call back or seek an in-person evaluation if the symptoms worsen or if the condition fails to improve as anticipated.   I provided 15 minutes of non-face-to-face time during this encounter.  RTC in 1 year  Rulon Eisenmenger, MD 02/10/2019    I, Cloyde Reams Dorshimer, am acting as scribe for Nicholas Lose, MD.  I have reviewed the above documentation for accuracy and completeness, and I agree with the above.

## 2019-02-10 ENCOUNTER — Inpatient Hospital Stay: Payer: Medicare Other | Attending: Hematology and Oncology | Admitting: Hematology and Oncology

## 2019-02-10 ENCOUNTER — Other Ambulatory Visit: Payer: Medicare Other

## 2019-02-10 DIAGNOSIS — C50411 Malignant neoplasm of upper-outer quadrant of right female breast: Secondary | ICD-10-CM | POA: Diagnosis not present

## 2019-02-10 DIAGNOSIS — Z171 Estrogen receptor negative status [ER-]: Secondary | ICD-10-CM | POA: Diagnosis not present

## 2019-02-11 ENCOUNTER — Other Ambulatory Visit: Payer: Self-pay | Admitting: Family Medicine

## 2019-02-11 DIAGNOSIS — Z853 Personal history of malignant neoplasm of breast: Secondary | ICD-10-CM

## 2019-03-03 ENCOUNTER — Ambulatory Visit
Admission: RE | Admit: 2019-03-03 | Discharge: 2019-03-03 | Disposition: A | Discharge status: Home / Self Care | Payer: Medicare Other | Source: Ambulatory Visit | Attending: Family Medicine | Admitting: Family Medicine

## 2019-03-03 ENCOUNTER — Other Ambulatory Visit: Payer: Self-pay | Admitting: Family Medicine

## 2019-03-03 ENCOUNTER — Other Ambulatory Visit: Payer: Self-pay

## 2019-03-03 DIAGNOSIS — Z853 Personal history of malignant neoplasm of breast: Secondary | ICD-10-CM

## 2019-03-03 DIAGNOSIS — Z1231 Encounter for screening mammogram for malignant neoplasm of breast: Secondary | ICD-10-CM | POA: Diagnosis not present

## 2019-03-07 ENCOUNTER — Other Ambulatory Visit: Payer: Self-pay | Admitting: Family Medicine

## 2019-04-04 ENCOUNTER — Ambulatory Visit: Payer: Medicare Other | Admitting: Family Medicine

## 2019-04-05 ENCOUNTER — Other Ambulatory Visit: Payer: Self-pay

## 2019-04-05 ENCOUNTER — Ambulatory Visit (INDEPENDENT_AMBULATORY_CARE_PROVIDER_SITE_OTHER): Payer: Medicare Other | Admitting: Family Medicine

## 2019-04-05 ENCOUNTER — Encounter: Payer: Self-pay | Admitting: Family Medicine

## 2019-04-05 VITALS — BP 110/80 | Ht 59.0 in | Wt 189.0 lb

## 2019-04-05 DIAGNOSIS — Z0000 Encounter for general adult medical examination without abnormal findings: Secondary | ICD-10-CM

## 2019-04-05 NOTE — Patient Instructions (Signed)
Tara [name] , Thank you for taking time to come for your Medicare Wellness Visit. I appreciate your ongoing commitment to your health goals. Please review the following plan we discussed and let me know if I can assist you in the future.   Please continue to practice social distancing to keep you, your family, and our community safe.  If you must go out, please wear a Mask and practice good handwashing.  Screening recommendations/referrals: Colonoscopy: Due this year Mammogram: June 2020 up to date Bone Density: Completed  Recommended yearly ophthalmology/optometry visit for glaucoma screening and checkup Recommended yearly dental visit for hygiene and checkup  Vaccinations: Influenza vaccine: Due Fall 2020  Pneumococcal vaccine: Completed Tdap vaccine: Due 2022 Shingles vaccine: Reports having, not in chart  Advanced directives: Provide copies by mail  Conditions/risks identified: Fall   Next appointment: 08/08/2019    Preventive Care 65 Years and Older, Female Preventive care refers to lifestyle choices and visits with your health care provider that can promote health and wellness. What does preventive care include?  A yearly physical exam. This is also called an annual well check.  Dental exams once or twice a year.  Routine eye exams. Ask your health care provider how often you should have your eyes checked.  Personal lifestyle choices, including:  Daily care of your teeth and gums.  Regular physical activity.  Eating a healthy diet.  Avoiding tobacco and drug use.  Limiting alcohol use.  Practicing safe sex.  Taking low-dose aspirin every day.  Taking vitamin and mineral supplements as recommended by your health care provider. What happens during an annual well check? The services and screenings done by your health care provider during your annual well check will depend on your age, overall health, lifestyle risk factors, and family history of disease.  Counseling  Your health care provider may ask you questions about your:  Alcohol use.  Tobacco use.  Drug use.  Emotional well-being.  Home and relationship well-being.  Sexual activity.  Eating habits.  History of falls.  Memory and ability to understand (cognition).  Work and work Statistician.  Reproductive health. Screening  You may have the following tests or measurements:  Height, weight, and BMI.  Blood pressure.  Lipid and cholesterol levels. These may be checked every 5 years, or more frequently if you are over 43 years old.  Skin check.  Lung cancer screening. You may have this screening every year starting at age 53 if you have a 30-pack-year history of smoking and currently smoke or have quit within the past 15 years.  Fecal occult blood test (FOBT) of the stool. You may have this test every year starting at age 64.  Flexible sigmoidoscopy or colonoscopy. You may have a sigmoidoscopy every 5 years or a colonoscopy every 10 years starting at age 37.  Hepatitis C blood test.  Hepatitis B blood test.  Sexually transmitted disease (STD) testing.  Diabetes screening. This is done by checking your blood sugar (glucose) after you have not eaten for a while (fasting). You may have this done every 1-3 years.  Bone density scan. This is done to screen for osteoporosis. You may have this done starting at age 39.  Mammogram. This may be done every 1-2 years. Talk to your health care provider about how often you should have regular mammograms. Talk with your health care provider about your test results, treatment options, and if necessary, the need for more tests. Vaccines  Your health care provider may  recommend certain vaccines, such as:  Influenza vaccine. This is recommended every year.  Tetanus, diphtheria, and acellular pertussis (Tdap, Td) vaccine. You may need a Td booster every 10 years.  Zoster vaccine. You may need this after age 49.   Pneumococcal 13-valent conjugate (PCV13) vaccine. One dose is recommended after age 42.  Pneumococcal polysaccharide (PPSV23) vaccine. One dose is recommended after age 66. Talk to your health care provider about which screenings and vaccines you need and how often you need them. This information is not intended to replace advice given to you by your health care provider. Make sure you discuss any questions you have with your health care provider. Document Released: 10/05/2015 Document Revised: 05/28/2016 Document Reviewed: 07/10/2015 Elsevier Interactive Patient Education  2017 [place] Prevention in the Home Falls can cause injuries. They can happen to people of all ages. There are many things you can do to make your home safe and to help prevent falls. What can I do on the outside of my home?  Regularly fix the edges of walkways and driveways and fix any cracks.  Remove anything that might make you trip as you walk through a door, such as a raised step or threshold.  Trim any bushes or trees on the path to your home.  Use bright outdoor lighting.  Clear any walking paths of anything that might make someone trip, such as rocks or tools.  Regularly check to see if handrails are loose or broken. Make sure that both sides of any steps have handrails.  Any raised decks and porches should have guardrails on the edges.  Have any leaves, snow, or ice cleared regularly.  Use sand or salt on walking paths during winter.  Clean up any spills in your garage right away. This includes oil or grease spills. What can I do in the bathroom?  Use night lights.  Install grab bars by the toilet and in the tub and shower. Do not use towel bars as grab bars.  Use non-skid mats or decals in the tub or shower.  If you need to sit down in the shower, use a plastic, non-slip stool.  Keep the floor dry. Clean up any water that spills on the floor as soon as it happens.  Remove soap  buildup in the tub or shower regularly.  Attach bath mats securely with double-sided non-slip rug tape.  Do not have throw rugs and other things on the floor that can make you trip. What can I do in the bedroom?  Use night lights.  Make sure that you have a light by your bed that is easy to reach.  Do not use any sheets or blankets that are too big for your bed. They should not hang down onto the floor.  Have a firm chair that has side arms. You can use this for support while you get dressed.  Do not have throw rugs and other things on the floor that can make you trip. What can I do in the kitchen?  Clean up any spills right away.  Avoid walking on wet floors.  Keep items that you use a lot in easy-to-reach places.  If you need to reach something above you, use a strong step stool that has a grab bar.  Keep electrical cords out of the way.  Do not use floor polish or wax that makes floors slippery. If you must use wax, use non-skid floor wax.  Do not have throw rugs and  other things on the floor that can make you trip. What can I do with my stairs?  Do not leave any items on the stairs.  Make sure that there are handrails on both sides of the stairs and use them. Fix handrails that are broken or loose. Make sure that handrails are as long as the stairways.  Check any carpeting to make sure that it is firmly attached to the stairs. Fix any carpet that is loose or worn.  Avoid having throw rugs at the top or bottom of the stairs. If you do have throw rugs, attach them to the floor with carpet tape.  Make sure that you have a light switch at the top of the stairs and the bottom of the stairs. If you do not have them, ask someone to add them for you. What else can I do to help prevent falls?  Wear shoes that:  Do not have high heels.  Have rubber bottoms.  Are comfortable and fit you well.  Are closed at the toe. Do not wear sandals.  If you use a stepladder:  Make  sure that it is fully opened. Do not climb a closed stepladder.  Make sure that both sides of the stepladder are locked into place.  Ask someone to hold it for you, if possible.  Clearly mark and make sure that you can see:  Any grab bars or handrails.  First and last steps.  Where the edge of each step is.  Use tools that help you move around (mobility aids) if they are needed. These include:  Canes.  Walkers.  Scooters.  Crutches.  Turn on the lights when you go into a dark area. Replace any light bulbs as soon as they burn out.  Set up your furniture so you have a clear path. Avoid moving your furniture around.  If any of your floors are uneven, fix them.  If there are any pets around you, be aware of where they are.  Review your medicines with your doctor. Some medicines can make you feel dizzy. This can increase your chance of falling. Ask your doctor what other things that you can do to help prevent falls. This information is not intended to replace advice given to you by your health care provider. Make sure you discuss any questions you have with your health care provider. Document Released: 07/05/2009 Document Revised: 02/14/2016 Document Reviewed: 10/13/2014 Elsevier Interactive Patient Education  2017 [employer].

## 2019-04-05 NOTE — Progress Notes (Signed)
Subjective:   Tara [name] is a 73 y.o. female who presents for Medicare Annual (Subsequent) preventive examination.  Location of Patient: Home Location of Provider: Telehealth Consent was obtain for visit to be over via telehealth.  I verified that I am speaking with the correct person using two identifiers.   Review of Systems:  Cardiac Risk Factors include: advanced age (>54men, >51 women);dyslipidemia;obesity (BMI >30kg/m2);sedentary lifestyle;hypertension     Objective:     Vitals: BP 110/80    Ht 4\' 11"  (1.499 m)    Wt 189 lb (85.7 kg)    BMI 38.17 kg/m   Body mass index is 38.17 kg/m.  Advanced Directives 10/25/2018 06/10/2018 06/02/2018 07/29/2017 07/24/2017 07/10/2017 04/17/2017  Does Patient Have a Medical Advance Directive? No No No No No No No  Would patient like information on creating a medical advance directive? No - Patient declined No - Patient declined - No - Patient declined No - Patient declined No - Patient declined -  Pre-existing out of facility DNR order (yellow form or pink MOST form) - - - - - - -    Tobacco Social History   Tobacco Use  Smoking Status Never Smoker  Smokeless Tobacco Never Used     Counseling given: Not Answered   Clinical Intake:  Pre-visit preparation completed: Yes  Pain : No/denies pain Pain Score: 0-No pain     Diabetes: No  How often do you need to have someone help you when you read instructions, pamphlets, or other written materials from your doctor or pharmacy?: 1 - Never  Interpreter Needed?: No     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   Anxiety    Breast cancer ([place]) 2008   right - s/p lumpectomy->chemo, radiation   Depression    Dysrhythmia    hx SVT   Family history of colon cancer    Family history of prostate cancer    GERD (gastroesophageal reflux disease)    H/O: hysterectomy    Hiatal hernia     History of cancer chemotherapy    History of radiation therapy    Hyperlipidemia    Hypertension 01/25/2018   Nonischemic cardiomyopathy ([place])    Personal history of radiation therapy 01/05/209   SVT (supraventricular tachycardia) ([place])    short RP SVT documented 2/50   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[place])    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)   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   Past Surgical History:  Procedure Laterality Date   ABDOMINAL HYSTERECTOMY  1994   fibroids,    BIOPSY  06/19/2016   Procedure: BIOPSY;  Surgeon: Daneil Dolin, MD;  [facility];  Service: Endoscopy;;  gastric duodenum   BREAST BIOPSY Right 12/16/2006   malignant   BREAST EXCISIONAL BIOPSY Right 2018   benign lumpectomy   BREAST LUMPECTOMY Right 02/2007   BREAST LUMPECTOMY WITH RADIOACTIVE SEED LOCALIZATION Right 07/29/2017   Procedure: RIGHT BREAST LUMPECTOMY WITH RADIOACTIVE SEED LOCALIZATION;  Surgeon: Fanny Skates, MD;  Location: [facility];  Service: General;  Laterality: Right;   BREAST SURGERY Right 2008   lumpectomy, cancer   CARDIAC CATHETERIZATION     CHOLECYSTECTOMY  1999   COLONOSCOPY  2008   Dr. Oneida Alar: multiple polyps. Path not available at time of visit.    COLONOSCOPY WITH  PROPOFOL N/A 04/25/2014   Dr. Oneida Alar: Multiple tubular adenomas removed. Diverticulosis. Moderate internal hemorrhoids. Next colonoscopy planned for August 2018.   ESOPHAGOGASTRODUODENOSCOPY (EGD) WITH PROPOFOL N/A 06/19/2016   Procedure: ESOPHAGOGASTRODUODENOSCOPY (EGD) WITH PROPOFOL;  Surgeon: Daneil Dolin, MD;  [facility];  Service: Endoscopy;  Laterality: N/A;   MASTECTOMY W/ SENTINEL NODE BIOPSY Right 06/09/2018   Procedure: RIGHT TOTAL MASTECTOMY WITH SENTINEL LYMPH NODE BIOPSY;  Surgeon: Fanny Skates, MD;  Location: [facility];  Service: General;  Laterality: Right;   POLYPECTOMY N/A 04/25/2014   Procedure: POLYPECTOMY;   Surgeon: Danie Binder, MD;  Location: [facility];  Service: Endoscopy;  Laterality: N/A;  Ascending and Decending Colon x3 , Transverse colon x2, rectal   PORTACATH PLACEMENT N/A 06/09/2018   Procedure: INSERTION PORT-A-CATH;  Surgeon: Fanny Skates, MD;  Location: [facility];  Service: General;  Laterality: N/A;   RIGHT/LEFT HEART CATH AND CORONARY ANGIOGRAPHY N/A 07/10/2017   Procedure: RIGHT/LEFT HEART CATH AND CORONARY ANGIOGRAPHY;  Surgeon: Larey Dresser, MD;  Location: [facility];  Service: Cardiovascular;  Laterality: N/A;   Family History  Problem Relation Age of Onset   Colon cancer Father 33       died at age 64   Hypertension Sister    Heart disease Sister    Cancer Sister        unknown form   Dementia Mother    Stroke Mother 66       left hemiparesis   Cancer Brother 88       prostate   Cancer Paternal Aunt        NOS   Lung cancer Paternal Uncle    Other Paternal Uncle        lightning strike   Other Paternal Uncle        hit by train   Other Paternal 88        old age   7 Cousin        NOS pat first cousin   Cancer Cousin        NOS pat first cousin   Prostate cancer Cousin        pat first cousin   Social History   Socioeconomic History   Marital status: Married    Spouse name: Not on file   Number of children: 2   Years of education: Not on file   Highest education level: Not on file  Occupational History   Occupation: disabled     Fish farm manager: UNEMPLOYED  Social Designer, fashion/clothing strain: Not hard at all   Food insecurity    Worry: Never true    Inability: Never true   Transportation needs    Medical: No    Non-medical: No  Tobacco Use   Smoking status: Never Smoker   Smokeless tobacco: Never Used  Substance and Sexual Activity   Alcohol use: No   Drug use: No   Sexual activity: Not on file  Lifestyle   Physical activity    Days per week: 2 days    Minutes per session: 10 min   Stress:  Not at all  Relationships   Social connections    Talks on phone: More than three times a week    Gets together: More than three times a week    Attends religious service: More than 4 times per year    Active member of club or organization: No    Attends meetings of clubs or organizations:  Not on file    Relationship status: Married  Other Topics Concern   Not on file  Social History Narrative   Lives in [place] with family.  Does not routinely exercise.    Outpatient Encounter Medications as of 04/05/2019  Medication Sig   acetaminophen (TYLENOL) 500 MG tablet Take 500-1,000 mg by mouth every 6 (six) hours as needed (FOR PAIN.).   aspirin EC 81 MG tablet Take 81 mg by mouth daily.    azelastine (ASTELIN) 0.1 % nasal spray Place 2 sprays into both nostrils 2 (two) times daily. Use in each nostril as directed (Patient taking differently: Place 2 sprays into both nostrils 2 (two) times daily as needed (cold symptoms). Use in each nostril as directed)   clonazePAM (KLONOPIN) 0.5 MG tablet One tablet at bedtime   docusate sodium (COLACE) 100 MG capsule Take 200 mg by mouth 2 (two) times daily.    ENTRESTO 24-26 MG TAKE (1) TABLET BY MOUTH TWICE DAILY.   folic acid (FOLVITE) 1 MG tablet TAKE ONE TABLET BY MOUTH DAILY.   lovastatin (MEVACOR) 40 MG tablet TAKE 1 TABLET BY MOUTH AT BEDTIME   metoCLOPramide (REGLAN) 10 MG tablet Take 1 tablet (10 mg total) by mouth 2 (two) times daily.   metoprolol succinate (TOPROL-XL) 50 MG 24 hr tablet TAKE 1 TABLET BY MOUTH IN THE MORNING AND 1/2 TABLET IN THE EVENING.   montelukast (SINGULAIR) 10 MG tablet TAKE ONE TABLET BY MOUTH AT BEDTIME.   pantoprazole (PROTONIX) 40 MG tablet TAKE (1) TABLET BY MOUTH TWICE DAILY.   sertraline (ZOLOFT) 25 MG tablet Take 1 tablet (25 mg total) by mouth daily.   spironolactone (ALDACTONE) 25 MG tablet TAKE 1 TABLET BY MOUTH ONCE A DAY. (Patient taking differently: Take 25 mg by mouth daily. )    zolpidem (AMBIEN) 10 MG tablet Take 1 tablet (10 mg total) by mouth at bedtime as needed. for sleep   No facility-administered encounter medications on file as of 04/05/2019.     Activities of Daily Living In your present state of health, do you have any difficulty performing the following activities: 06/10/2018 06/09/2018  Hearing? N -  Vision? N -  Difficulty concentrating or making decisions? N -  Walking or climbing stairs? Y -  Comment - -  Dressing or bathing? N -  Doing errands, shopping? - N  Some recent data might be hidden    Patient Care Team: [name], MD as PCP - Bethena Roys, MD as Consulting Physician (Cardiology) Cottle, Billey Co., MD as Attending Physician (Psychiatry)    Assessment:   This is a routine wellness examination for [place].  Exercise Activities and Dietary recommendations Current Exercise Habits: The patient does not participate in regular exercise at present, Exercise limited by: cardiac condition(s)  Goals   None     Fall Risk Fall Risk  04/05/2019 08/05/2018 04/27/2018 03/30/2018 01/12/2018  Falls in the past year? 0 0 No No No  Number falls in past yr: 0 0 - - -  Injury with Fall? 0 0 - - -  Risk Factor Category  - - - - -  Risk for fall due to : - - - - -  Follow up - - - - -   Is the patient's home free of loose throw rugs in walkways, pet beds, electrical cords, etc?   yes      Grab bars in the bathroom? yes      Handrails on the  stairs?   yes      Adequate lighting?   yes  Timed Get Up and Go performed: n/a telemed visit   Depression Screen PHQ 2/9 Scores 04/05/2019 04/05/2019 12/23/2018 08/05/2018  PHQ - 2 Score 0 0 0 0  PHQ- 9 Score 0 - - -  Exception Documentation Medical reason Medical reason - -     Cognitive Function     6CIT Screen 04/05/2019 04/05/2019  What Year? 0 points 0 points  What month? - 0 points  What time? 0 points 0 points  Count back from 20 0 points 0 points  Months in reverse 0 points 0  points  Repeat phrase 0 points 0 points  Total Score - 0    Immunization History  Administered Date(s) Administered   Pneumococcal Conjugate-13 12/11/2014   Pneumococcal Polysaccharide-23 07/03/2016    Qualifies for Shingles Vaccine? No in chart, reports having one   Screening Tests Health Maintenance  Topic Date Due   INFLUENZA VACCINE  04/23/2019   COLONOSCOPY  04/27/2019   MAMMOGRAM  03/02/2021   TETANUS/TDAP  07/21/2021   DEXA SCAN  Completed   Hepatitis C Screening  Completed   PNA vac Low Risk Adult  Completed    Cancer Screenings: Lung: Low Dose CT Chest recommended if Age 76-80 years, 30 pack-year currently smoking OR have quit w/in 15years. Patient does not qualify. Breast:  Up to date on Mammogram? Yes  In June  Up to date of Bone Density/Dexa? Yes Colorectal: Due this year  Additional Screenings:  Hepatitis C Screening: Completed      Plan:     1. Encounter for Medicare annual wellness exam  I have personally reviewed and noted the following in the patients chart:    Medical and social history  Use of alcohol, tobacco or illicit drugs   Current medications and supplements  Functional ability and status  Nutritional status  Physical activity  Advanced directives  List of other physicians  Hospitalizations, surgeries, and ER visits in previous 12 months  Vitals  Screenings to include cognitive, depression, and falls  Referrals and appointments  In addition, I have reviewed and discussed with patient certain preventive protocols, quality metrics, and best practice recommendations. A written personalized care plan for preventive services as well as general preventive health recommendations were provided to patient.    I provided 20 minutes of non-face-to-face time during this encounter.   Perlie Mayo, NP  04/05/2019

## 2019-04-18 ENCOUNTER — Other Ambulatory Visit: Payer: Self-pay | Admitting: Family Medicine

## 2019-04-18 ENCOUNTER — Other Ambulatory Visit (HOSPITAL_COMMUNITY): Payer: Self-pay | Admitting: Cardiology

## 2019-04-26 ENCOUNTER — Other Ambulatory Visit: Payer: Self-pay | Admitting: Internal Medicine

## 2019-05-17 ENCOUNTER — Other Ambulatory Visit: Payer: Self-pay | Admitting: Family Medicine

## 2019-05-17 ENCOUNTER — Other Ambulatory Visit (HOSPITAL_COMMUNITY): Payer: Self-pay | Admitting: Cardiology

## 2019-05-17 MED ORDER — MONTELUKAST SODIUM 10 MG PO TABS: 10.0000 mg | ORAL_TABLET | Freq: Every day | ORAL | 3 refills | Status: DC

## 2019-05-17 MED ORDER — ZOLPIDEM TARTRATE 10 MG PO TABS: 10.0000 mg | ORAL_TABLET | Freq: Every evening | ORAL | 3 refills | Status: DC | PRN

## 2019-05-17 NOTE — Progress Notes (Signed)
singulair  

## 2019-06-07 ENCOUNTER — Other Ambulatory Visit: Payer: Self-pay | Admitting: Family Medicine

## 2019-06-14 ENCOUNTER — Other Ambulatory Visit (HOSPITAL_COMMUNITY): Payer: Self-pay | Admitting: Cardiology

## 2019-06-14 ENCOUNTER — Other Ambulatory Visit: Payer: Self-pay | Admitting: Family Medicine

## 2019-06-15 NOTE — Telephone Encounter (Signed)
This is a CHF pt 

## 2019-07-12 ENCOUNTER — Other Ambulatory Visit (HOSPITAL_COMMUNITY): Payer: Self-pay | Admitting: Cardiology

## 2019-07-19 ENCOUNTER — Other Ambulatory Visit: Payer: Self-pay | Admitting: Internal Medicine

## 2019-08-02 DIAGNOSIS — I10 Essential (primary) hypertension: Secondary | ICD-10-CM | POA: Diagnosis not present

## 2019-08-02 DIAGNOSIS — C50911 Malignant neoplasm of unspecified site of right female breast: Secondary | ICD-10-CM | POA: Diagnosis not present

## 2019-08-02 DIAGNOSIS — I428 Other cardiomyopathies: Secondary | ICD-10-CM | POA: Diagnosis not present

## 2019-08-02 DIAGNOSIS — Z6839 Body mass index (BMI) 39.0-39.9, adult: Secondary | ICD-10-CM | POA: Diagnosis not present

## 2019-08-08 ENCOUNTER — Other Ambulatory Visit: Payer: Self-pay

## 2019-08-08 ENCOUNTER — Encounter: Payer: Self-pay | Admitting: Family Medicine

## 2019-08-08 ENCOUNTER — Ambulatory Visit (INDEPENDENT_AMBULATORY_CARE_PROVIDER_SITE_OTHER): Payer: Medicare Other | Admitting: Family Medicine

## 2019-08-08 VITALS — BP 122/80 | HR 60 | Temp 97.5°F | Resp 15 | Ht 59.0 in | Wt 195.0 lb

## 2019-08-08 DIAGNOSIS — Z0000 Encounter for general adult medical examination without abnormal findings: Secondary | ICD-10-CM

## 2019-08-08 DIAGNOSIS — I10 Essential (primary) hypertension: Secondary | ICD-10-CM | POA: Diagnosis not present

## 2019-08-08 DIAGNOSIS — E559 Vitamin D deficiency, unspecified: Secondary | ICD-10-CM | POA: Diagnosis not present

## 2019-08-08 DIAGNOSIS — E7800 Pure hypercholesterolemia, unspecified: Secondary | ICD-10-CM | POA: Diagnosis not present

## 2019-08-08 DIAGNOSIS — Z0001 Encounter for general adult medical examination with abnormal findings: Secondary | ICD-10-CM | POA: Diagnosis not present

## 2019-08-08 DIAGNOSIS — Z23 Encounter for immunization: Secondary | ICD-10-CM

## 2019-08-08 DIAGNOSIS — Z1211 Encounter for screening for malignant neoplasm of colon: Secondary | ICD-10-CM

## 2019-08-08 NOTE — Patient Instructions (Addendum)
F/u IN 4.5 MONTHS , CALL IF TYOU NEED ME SOONER  FLU VACCINE TODAY  LABS TODAY, CBC LIPID, CMP AND EGFR, TSH, VIT D  YOU NEED  YOUR COLON CANCER SCREENING, THIS IS PAST DUE, I WILL REFER YOU TO DR FIELDS, GOOD THAT YOU ARE AGREEING TO THIS  Think about what you will eat, plan ahead. Choose " clean, green, fresh or frozen" over canned, processed or packaged foods which are more sugary, salty and fatty. 70 to 75% of food eaten should be vegetables and fruit. Three meals at set times with snacks allowed between meals, but they must be fruit or vegetables. Aim to eat over a 12 hour period , example 7 am to 7 pm, and STOP after  your last meal of the day. Drink water,generally about 64 ounces per day, no other drink is as healthy. Fruit juice is best enjoyed in a healthy way, by EATING the fruit. It is important that you exercise regularly at least 30 minutes 5 times a week. If you develop chest pain, have severe difficulty breathing, or feel very tired, stop exercising immediately and seek medical attention  Thanks for choosing [facility], we consider it a privelige to serve you.

## 2019-08-08 NOTE — Progress Notes (Signed)
    Tara [name]     MRN: [id number]      DOB: 1945/12/01  HPI: Patient is in for annual physical exam.  Immunization is reviewed , and  updated    PE: BP 122/80   Pulse 60   Temp (!) 97.5 F (36.4 C) (Temporal)   Resp 15   Ht 4\' 11"  (1.499 m)   Wt 195 lb (88.5 kg)   SpO2 93%   BMI 39.39 kg/m   Pleasant  female, alert and oriented x 3, in no cardio-pulmonary distress. Afebrile. HEENT No facial trauma or asymetry. Sinuses non tender.  Extra occullar muscles intact.. External ears normal, . Neck: supple, no adenopathy,JVD or thyromegaly.No bruits.  Chest: Clear to ascultation bilaterally.No crackles or wheezes. Non tender to palpation  Breast: Left mastectomy. Right breast, no mass, no nipple inversion  Cardiovascular system; Heart sounds normal,  S1 and  S2 ,no S3.  No murmur, or thrill. Apical beat not displaced Peripheral pulses normal.  Abdomen: Soft, non tender, no organomegaly or masses. No bruits. Bowel sounds normal. No guarding, tenderness or rebound.    Musculoskeletal exam: decreased though adequate ROM of spine, hips , shoulders and knees. No deformity ,swelling or crepitus noted. No muscle wasting or atrophy.   Neurologic: Cranial nerves 2 to 12 intact. Power, tone ,sensation  normal throughout.  disturbance in gait. No tremor.  Skin: Intact, no ulceration, erythema , scaling or rash noted. Pigmentation normal throughout  Psych; Normal mood and affect. Judgement and concentration normal   Assessment & Plan:  Encounter for screening colonoscopy Annual exam as documented. Counseling done  re healthy lifestyle involving commitment to 150 minutes exercise per week, heart healthy diet, and attaining healthy weight.The importance of adequate sleep also discussed. Regular seat belt use and home safety, is also discussed. Changes in health habits are decided on by the patient with goals and time frames  set for achieving them. Immunization  and cancer screening needs are specifically addressed at this visit.

## 2019-08-09 ENCOUNTER — Encounter: Payer: Self-pay | Admitting: Family Medicine

## 2019-08-09 ENCOUNTER — Other Ambulatory Visit: Payer: Self-pay | Admitting: Family Medicine

## 2019-08-09 LAB — COMPLETE METABOLIC PANEL WITH GFR
AG Ratio: 1.2 (calc) (ref 1.0–2.5)
ALT: 7 U/L (ref 6–29)
AST: 13 U/L (ref 10–35)
Albumin: 3.9 g/dL (ref 3.6–5.1)
Alkaline phosphatase (APISO): 77 U/L (ref 37–153)
BUN/Creatinine Ratio: 12 (calc) (ref 6–22)
BUN: 17 mg/dL (ref 7–25)
CO2: 23 mmol/L (ref 20–32)
Calcium: 9.5 mg/dL (ref 8.6–10.4)
Chloride: 108 mmol/L (ref 98–110)
Creat: 1.4 mg/dL — ABNORMAL HIGH (ref 0.60–0.93)
GFR, Est African American: 43 mL/min/{1.73_m2} — ABNORMAL LOW (ref >=60)
GFR, Est Non African American: 37 mL/min/{1.73_m2} — ABNORMAL LOW (ref >=60)
Globulin: 3.2 g/dL (calc) (ref 1.9–3.7)
Glucose, Bld: 94 mg/dL (ref 65–99)
Potassium: 4.1 mmol/L (ref 3.5–5.3)
Sodium: 140 mmol/L (ref 135–146)
Total Bilirubin: 0.3 mg/dL (ref 0.2–1.2)
Total Protein: 7.1 g/dL (ref 6.1–8.1)

## 2019-08-09 LAB — VITAMIN D 25 HYDROXY (VIT D DEFICIENCY, FRACTURES): Vit D, 25-Hydroxy: 14 ng/mL — ABNORMAL LOW (ref 30–100)

## 2019-08-09 LAB — CBC
HCT: 35.8 % (ref 35.0–45.0)
Hemoglobin: 11.3 g/dL — ABNORMAL LOW (ref 11.7–15.5)
MCH: 26.7 pg — ABNORMAL LOW (ref 27.0–33.0)
MCHC: 31.6 g/dL — ABNORMAL LOW (ref 32.0–36.0)
MCV: 84.6 fL (ref 80.0–100.0)
MPV: 10.9 fL (ref 7.5–12.5)
Platelets: 288 10*3/uL (ref 140–400)
RBC: 4.23 10*6/uL (ref 3.80–5.10)
RDW: 14.4 % (ref 11.0–15.0)
WBC: 8.3 10*3/uL (ref 3.8–10.8)

## 2019-08-09 LAB — LIPID PANEL
Cholesterol: 165 mg/dL (ref <200)
HDL: 48 mg/dL — ABNORMAL LOW (ref >=50)
LDL Cholesterol (Calc): 98 mg/dL (calc)
Non-HDL Cholesterol (Calc): 117 mg/dL (calc) (ref <130)
Total CHOL/HDL Ratio: 3.4 (calc) (ref <5.0)
Triglycerides: 92 mg/dL (ref <150)

## 2019-08-09 LAB — TSH: TSH: 4.58 mIU/L — ABNORMAL HIGH (ref 0.40–4.50)

## 2019-08-09 MED ORDER — ERGOCALCIFEROL 1.25 MG (50000 UT) PO CAPS: 50000.0000 [IU] | ORAL_CAPSULE | ORAL | 1 refills | Status: DC

## 2019-08-09 NOTE — Assessment & Plan Note (Signed)

## 2019-08-15 ENCOUNTER — Encounter: Payer: Self-pay | Admitting: Gastroenterology

## 2019-08-16 ENCOUNTER — Other Ambulatory Visit (HOSPITAL_COMMUNITY): Payer: Self-pay | Admitting: Cardiology

## 2019-09-01 ENCOUNTER — Other Ambulatory Visit: Payer: Self-pay | Admitting: Family Medicine

## 2019-09-04 ENCOUNTER — Other Ambulatory Visit: Payer: Self-pay | Admitting: Family Medicine

## 2019-09-04 MED ORDER — CLONAZEPAM 0.5 MG PO TABS: 0.5000 mg | ORAL_TABLET | Freq: Every day | ORAL | 1 refills | Status: DC

## 2019-09-04 MED ORDER — ZOLPIDEM TARTRATE 10 MG PO TABS: 10.0000 mg | ORAL_TABLET | Freq: Every day | ORAL | 4 refills | Status: DC

## 2019-09-05 ENCOUNTER — Other Ambulatory Visit: Payer: Self-pay

## 2019-09-05 MED ORDER — PANTOPRAZOLE SODIUM 40 MG PO TBEC: 40.0000 mg | DELAYED_RELEASE_TABLET | Freq: Two times a day (BID) | ORAL | 0 refills | Status: DC

## 2019-09-05 MED ORDER — METOCLOPRAMIDE HCL 10 MG PO TABS: 10.0000 mg | ORAL_TABLET | Freq: Two times a day (BID) | ORAL | 0 refills | Status: DC

## 2019-09-09 ENCOUNTER — Ambulatory Visit: Payer: Medicare Other | Admitting: Gastroenterology

## 2019-09-09 ENCOUNTER — Other Ambulatory Visit: Payer: Self-pay

## 2019-09-12 ENCOUNTER — Other Ambulatory Visit: Payer: Self-pay | Admitting: Family Medicine

## 2019-09-12 ENCOUNTER — Other Ambulatory Visit (HOSPITAL_COMMUNITY): Payer: Self-pay | Admitting: Cardiology

## 2019-10-13 ENCOUNTER — Other Ambulatory Visit (HOSPITAL_COMMUNITY): Payer: Self-pay | Admitting: Cardiology

## 2019-10-13 ENCOUNTER — Other Ambulatory Visit: Payer: Self-pay | Admitting: Family Medicine

## 2019-10-25 ENCOUNTER — Telehealth: Payer: Self-pay | Admitting: Internal Medicine

## 2019-10-25 MED ORDER — SPIRONOLACTONE 25 MG PO TABS: 25.0000 mg | ORAL_TABLET | Freq: Every day | ORAL | 0 refills | Status: DC

## 2019-10-25 NOTE — Telephone Encounter (Signed)
Patient is calling to inquire about whether she should continue to take spironolactone (ALDACTONE) 25 MG tablet medication. Please call and advise.

## 2019-10-25 NOTE — Telephone Encounter (Signed)
Refilled spironolactone for 90 days d/t Pt made appt to see RU 10/2019.  Advised Pt to keep appt for future refills.

## 2019-10-28 ENCOUNTER — Encounter: Payer: Self-pay | Admitting: Physician Assistant

## 2019-10-28 NOTE — Telephone Encounter (Signed)
error 

## 2019-10-31 NOTE — Progress Notes (Signed)
Cardiology Office Note Date:  11/02/2019  Patient ID:  Tara [name] 1946/08/23, MRN [id number] PCP:  [name], MD  Cardiologist:  Dr. Aundra Dubin (last 2018) Electrophysiologist: Dr. Rayann Heman    Chief Complaint: over due visit  History of Present Illness: Tara [name] is a 74 y.o. female with history of breast Ca (2008 and had lumpectomy, radiation, and chemotherapy which included epirubicin, and anthracycline  >> 2019 recurrent cancer treated with mastectomy and chemo with CMF), NICM, DVT (2018 in setting of pneumonia), SVT, HLD, chronic CHF.  She comes in today to be seen for Dr. Rayann Heman, last seen by him Jan 2020.  She was undergoing chemo, mentioned very brief palpitations after her chemo treatments, lasting only seconds.  No changes were made, recommended to up to date with Dr. Aundra Dubin with plans to see EP prn.  She is doing well, needs refills on her aldactone.  She feels well.  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   She denies any CP, will only feels some light palpitations when she has had too much caffeine.  She denies any rest SOB, no symptoms of PND or orthopnea.  She will get a little winded when she walks up her stairs quickly, but not if she paces her self. No dizzy spells near syncope or syncope.  She does not formally exercise, but cares for her home, cleans, cooks.  laundry is in the basement and does the laundry as well.  Also mentions she is up/down the stairs to the basement to visit wiith her husband is where he spends a fair amount of time in his space down there.  She deneis difficulties with these activities, again, as long as she walks the steps at a slower pace, but does not have to stop to rest.  Past Medical History:  Diagnosis Date  . Abnormal mammogram of right breast 07/29/2017  . 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 Anxiety   . Breast cancer [facility]) 2008   right - s/p lumpectomy->chemo, radiation  . Depression   . Dysrhythmia     hx SVT  . Family history of colon cancer   . Family history of prostate cancer   . GERD (gastroesophageal reflux disease)   . H/O: hysterectomy   . Hiatal hernia   . History of cancer chemotherapy   . History of radiation therapy   . Hyperlipidemia   . Hypertension 01/25/2018  . Nonischemic cardiomyopathy ([place])   . Personal history of radiation therapy 01/05/209  . SVT (supraventricular tachycardia) ([place])    short RP SVT documented 5/14  . 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[place])   . 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 ABDOMINAL HYSTERECTOMY  1994   fibroids,   . BIOPSY  06/19/2016   Procedure: BIOPSY;  Surgeon: Daneil Dolin, MD;  [facility];  Service: Endoscopy;;  gastric duodenum  . BREAST BIOPSY Right 12/16/2006   malignant  . BREAST EXCISIONAL BIOPSY Right 2018   benign lumpectomy  . BREAST LUMPECTOMY Right 02/2007  . BREAST LUMPECTOMY WITH RADIOACTIVE SEED LOCALIZATION Right 07/29/2017   Procedure: RIGHT BREAST LUMPECTOMY WITH RADIOACTIVE SEED LOCALIZATION;  Surgeon: Fanny Skates, MD;  Location: [place];  Service: General;  Laterality: Right;  . BREAST SURGERY Right 2008  lumpectomy, cancer  . CARDIAC CATHETERIZATION    . CHOLECYSTECTOMY  1999  . COLONOSCOPY  2008   Dr. Oneida Alar: multiple polyps. Path not available at time of visit.   Marland Kitchen COLONOSCOPY WITH PROPOFOL N/A 04/25/2014   Dr. Oneida Alar: Multiple tubular adenomas removed. Diverticulosis. Moderate internal hemorrhoids. Next colonoscopy planned for August 2018.  Marland Kitchen ESOPHAGOGASTRODUODENOSCOPY (EGD) WITH PROPOFOL N/A 06/19/2016   Procedure: ESOPHAGOGASTRODUODENOSCOPY (EGD) WITH PROPOFOL;  Surgeon: Daneil Dolin, MD;  [facility];  Service: Endoscopy;  Laterality: N/A;  . MASTECTOMY W/ SENTINEL NODE BIOPSY Right 06/09/2018   Procedure: RIGHT TOTAL  MASTECTOMY WITH SENTINEL LYMPH NODE BIOPSY;  Surgeon: Fanny Skates, MD;  Location: [place];  Service: General;  Laterality: Right;  . POLYPECTOMY N/A 04/25/2014   Procedure: POLYPECTOMY;  Surgeon: Danie Binder, MD;  Location: [facility];  Service: Endoscopy;  Laterality: N/A;  Ascending and Decending Colon x3 , Transverse colon x2, rectal  . PORTACATH PLACEMENT N/A 06/09/2018   Procedure: INSERTION PORT-A-CATH;  Surgeon: Fanny Skates, MD;  Location: [place];  Service: General;  Laterality: N/A;  . RIGHT/LEFT HEART CATH AND CORONARY ANGIOGRAPHY N/A 07/10/2017   Procedure: RIGHT/LEFT HEART CATH AND CORONARY ANGIOGRAPHY;  Surgeon: Larey Dresser, MD;  Location: [place] [facility];  Service: Cardiovascular;  Laterality: N/A;    Current Outpatient Medications  Medication Sig Dispense Refill  . acetaminophen (TYLENOL) 500 MG tablet Take 500-1,000 mg by mouth every 6 (six) hours as needed (FOR PAIN.).    Marland Kitchen aspirin EC 81 MG tablet Take 81 mg by mouth daily.     Marland Kitchen azelastine (ASTELIN) 0.1 % nasal spray Place 2 sprays into both nostrils 2 (two) times daily. Use in each nostril as directed (Patient taking differently: Place 2 sprays into both nostrils 2 (two) times daily as needed (cold symptoms). Use in each nostril as directed) 30 mL 12  . clonazePAM (KLONOPIN) 0.5 MG tablet TAKE (1) TABLET BY MOUTH AT BEDTIME. 90 tablet 0  . docusate sodium (COLACE) 100 MG capsule Take 200 mg by mouth 2 (two) times daily.     Marland Kitchen ENTRESTO 24-26 MG TAKE (1) TABLET BY MOUTH TWICE DAILY. 60 tablet 11  . ergocalciferol (VITAMIN D2) 1.25 MG (50000 UT) capsule Take 1 capsule (50,000 Units total) by mouth once a week. One capsule once weekly 12 capsule 1  . folic acid (FOLVITE) 1 MG tablet TAKE ONE TABLET BY MOUTH DAILY. 100 tablet 0  . lovastatin (MEVACOR) 40 MG tablet TAKE 1 TABLET BY MOUTH AT BEDTIME 90 tablet 2  . metoCLOPramide (REGLAN) 10 MG tablet Take 1 tablet (10 mg total) by mouth 2 (two) times daily. 180 tablet 0  .  metoprolol succinate (TOPROL-XL) 50 MG 24 hr tablet TAKE 1 TABLET BY MOUTH IN THE MORNING AND 1/2 TABLET IN THE EVENING. 45 tablet 0  . montelukast (SINGULAIR) 10 MG tablet TAKE ONE TABLET BY MOUTH AT BEDTIME. 30 tablet 0  . montelukast (SINGULAIR) 10 MG tablet Take 1 tablet (10 mg total) by mouth at bedtime. 30 tablet 3  . pantoprazole (PROTONIX) 40 MG tablet Take 1 tablet (40 mg total) by mouth 2 (two) times daily. 180 tablet 0  . sertraline (ZOLOFT) 25 MG tablet TAKE 1 TABLET BY MOUTH ONCE A DAY. 30 tablet 0  . zolpidem (AMBIEN) 10 MG tablet Take 1 tablet (10 mg total) by mouth at bedtime. 30 tablet 4  . spironolactone (ALDACTONE) 25 MG tablet Take 1 tablet (25 mg total) by mouth daily.  90 tablet 3   No current facility-administered medications for this visit.    Allergies:   Aspirin, Lisinopril, Sudafed  [pseudoephedrine hcl], and Sudafed [pseudoephedrine hcl]   Social History:  The patient  reports that she has never smoked. She has never used smokeless tobacco. She reports that she does not drink alcohol or use drugs.   Family History:  The patient's family history includes Cancer in her cousin, cousin, paternal aunt, and sister; Cancer (age of onset: 62) in her brother; Colon cancer (age of onset: 64) in her father; Dementia in her mother; Heart disease in her sister; Hypertension in her sister; Lung cancer in her paternal uncle; Other in her paternal aunt, paternal uncle, and paternal uncle; Prostate cancer in her cousin; Stroke (age of onset: 52) in her mother.  ROS:  Please see the history of present illness.  All other systems are reviewed and otherwise negative.   PHYSICAL EXAM:  VS:  BP 108/64   Pulse 73   Ht 4\' 9"  (1.448 m)   Wt 190 lb (86.2 kg)   BMI 41.12 kg/m  BMI: Body mass index is 41.12 kg/m. Well nourished, well developed, in no acute distress  HEENT: normocephalic, atraumatic  Neck: no JVD, carotid bruits or masses Cardiac:  RRR; no significant murmurs, no rubs,  or gallops Lungs:  CTA b/l, no wheezing, rhonchi or rales  Abd: soft, nontender, obese MS: no deformity or atrophy Ext:  no edema  Skin: warm and dry, no rash Neuro:  No gross deficits appreciated Psych: euthymic mood, full affect   EKG:  Done today and reviewed by myself  shows  AR 73bpm, LAD, poor R progression, appears unchanged   08/10/2017: c.MRI IMPRESSION: 1.  Normal LV size with EF 49%, diffuse hypokinesis. 2.  Mildly dilated RV with normal systolic function. 3. No myocardial LGE, so no definitive evidence for prior MI, infiltrative disease, or myocarditis.   07/10/2017: R/LHC 1. Mildly elevated PCWP, mild pulmonary hypertension.  2. Preserved cardiac output.  3. No significant coronary disease, nonischemic cardiomyopathy.    No change to medication regimen.  She should be stable for breast surgery.    10/01/2016; TTE Study Conclusions  - Left ventricle: The cavity size was normal. Systolic function was  severely reduced. The estimated ejection fraction was in the  range of 20% to 25%. Diffuse hypokinesis. Features are consistent  with a pseudonormal left ventricular filling pattern, with  concomitant abnormal relaxation and increased filling pressure  (grade 2 diastolic dysfunction).  - Ventricular septum: The contour showed diastolic flattening.  - Mitral valve: There was moderate regurgitation.  - Left atrium: The atrium was mildly dilated.  - Tricuspid valve: There was severe regurgitation.  - Pulmonary arteries: Systolic pressure was mildly increased. PA  peak pressure: 35 mm Hg (S).   Impressions: - Compared to the prior study, there has been no significant  interval change.      Recent Labs: 08/08/2019: ALT 7; BUN 17; Creat 1.40; Hemoglobin 11.3; Platelets 288; Potassium 4.1; Sodium 140; TSH 4.58  08/08/2019: Cholesterol 165; HDL 48; LDL Cholesterol (Calc) 98; Total CHOL/HDL Ratio 3.4; Triglycerides 92   CrCl cannot be calculated  (Patient's most recent lab result is older than the maximum 21 days allowed.).   Wt Readings from Last 3 Encounters:  11/02/19 190 lb (86.2 kg)  08/08/19 195 lb (88.5 kg)  04/05/19 189 lb (85.7 kg)     Other studies reviewed: Additional studies/records reviewed today include: summarized above  ASSESSMENT  AND PLAN:  1. H/o SVT     No significant palpitations  2. NICM 3. Chronic CHF     No symptoms or exam findings to suggest volume OL     On BB, Entresto, aldactone     LVEF by c.MRI was 49%   Disposition: F/u with EP as needed, will have her get re-established with Dr. Aundra Dubin and HF team  Current medicines are reviewed at length with the patient today.  The patient did not have any concerns regarding medicines.  Venetia Night, PA-C 11/02/2019 1:41 PM     [address] [place] [id number] [phone] (office)  [phone] (fax)

## 2019-11-01 ENCOUNTER — Other Ambulatory Visit: Payer: Self-pay | Admitting: Family Medicine

## 2019-11-02 ENCOUNTER — Other Ambulatory Visit: Payer: Self-pay

## 2019-11-02 ENCOUNTER — Ambulatory Visit: Payer: Medicare Other | Admitting: Physician Assistant

## 2019-11-02 VITALS — BP 108/64 | HR 73 | Ht <= 58 in | Wt 190.0 lb

## 2019-11-02 DIAGNOSIS — I5022 Chronic systolic (congestive) heart failure: Secondary | ICD-10-CM | POA: Diagnosis not present

## 2019-11-02 DIAGNOSIS — I471 Supraventricular tachycardia: Secondary | ICD-10-CM | POA: Diagnosis not present

## 2019-11-02 DIAGNOSIS — I428 Other cardiomyopathies: Secondary | ICD-10-CM

## 2019-11-02 MED ORDER — SPIRONOLACTONE 25 MG PO TABS: 25.0000 mg | ORAL_TABLET | Freq: Every day | ORAL | 3 refills | Status: DC

## 2019-11-02 NOTE — Patient Instructions (Signed)
Medication Instructions:  Your physician recommends that you continue on your current medications as directed. Please refer to the Current Medication list given to you today.  *If you need a refill on your cardiac medications before your next appointment, please call your pharmacy*  Lab Work: [place]   If you have labs (blood work) drawn today and your tests are completely normal, you will receive your results only by: Marland Kitchen MyChart Message (if you have MyChart) OR . A paper copy in the mail If you have any lab test that is abnormal or we need to change your treatment, we will call you to review the results.  Testing/Procedures: NONE ORDERED  TODAY    Follow-Up: At [facility], you and your health needs are our priority.  As part of our continuing mission to provide you with exceptional heart care, we have created designated Provider Care Teams.  These Care Teams include your primary Cardiologist (physician) and Advanced Practice Providers (APPs -  Physician Assistants and Nurse Practitioners) who all work together to provide you with the care you need, when you need it.  Your next appointment:    NEXT AVIALABLE TO [facility] CARE  The format for your next appointment:   In Person  Provider:   Dr Marigene Ehlers  Other Instructions

## 2019-11-09 ENCOUNTER — Ambulatory Visit (HOSPITAL_COMMUNITY)
Admission: RE | Admit: 2019-11-09 | Discharge: 2019-11-09 | Disposition: A | Discharge status: Home / Self Care | Payer: Medicare Other | Source: Ambulatory Visit | Attending: Cardiology | Admitting: Cardiology

## 2019-11-09 ENCOUNTER — Encounter (HOSPITAL_COMMUNITY): Payer: Medicare Other | Admitting: Cardiology

## 2019-11-09 ENCOUNTER — Other Ambulatory Visit: Payer: Self-pay

## 2019-11-09 VITALS — BP 130/67 | HR 72 | Wt 190.0 lb

## 2019-11-09 DIAGNOSIS — Z86718 Personal history of other venous thrombosis and embolism: Secondary | ICD-10-CM | POA: Diagnosis not present

## 2019-11-09 DIAGNOSIS — I471 Supraventricular tachycardia: Secondary | ICD-10-CM

## 2019-11-09 DIAGNOSIS — Z7982 Long term (current) use of aspirin: Secondary | ICD-10-CM | POA: Diagnosis not present

## 2019-11-09 DIAGNOSIS — I428 Other cardiomyopathies: Secondary | ICD-10-CM | POA: Diagnosis not present

## 2019-11-09 DIAGNOSIS — Z9221 Personal history of antineoplastic chemotherapy: Secondary | ICD-10-CM | POA: Diagnosis not present

## 2019-11-09 DIAGNOSIS — Z923 Personal history of irradiation: Secondary | ICD-10-CM | POA: Diagnosis not present

## 2019-11-09 DIAGNOSIS — Z9011 Acquired absence of right breast and nipple: Secondary | ICD-10-CM | POA: Insufficient documentation

## 2019-11-09 DIAGNOSIS — K219 Gastro-esophageal reflux disease without esophagitis: Secondary | ICD-10-CM | POA: Insufficient documentation

## 2019-11-09 DIAGNOSIS — Z853 Personal history of malignant neoplasm of breast: Secondary | ICD-10-CM | POA: Diagnosis not present

## 2019-11-09 DIAGNOSIS — E785 Hyperlipidemia, unspecified: Secondary | ICD-10-CM | POA: Insufficient documentation

## 2019-11-09 DIAGNOSIS — I5022 Chronic systolic (congestive) heart failure: Secondary | ICD-10-CM | POA: Diagnosis not present

## 2019-11-09 DIAGNOSIS — Z79899 Other long term (current) drug therapy: Secondary | ICD-10-CM | POA: Insufficient documentation

## 2019-11-09 LAB — BASIC METABOLIC PANEL
Anion gap: 9 (ref 5–15)
BUN: 19 mg/dL (ref 8–23)
CO2: 19 mmol/L — ABNORMAL LOW (ref 22–32)
Calcium: 8.9 mg/dL (ref 8.9–10.3)
Chloride: 115 mmol/L — ABNORMAL HIGH (ref 98–111)
Creatinine, Ser: 1.45 mg/dL — ABNORMAL HIGH (ref 0.44–1.00)
GFR calc Af Amer: 41 mL/min — ABNORMAL LOW (ref >60)
GFR calc non Af Amer: 36 mL/min — ABNORMAL LOW (ref >60)
Glucose, Bld: 119 mg/dL — ABNORMAL HIGH (ref 70–99)
Potassium: 3.6 mmol/L (ref 3.5–5.1)
Sodium: 143 mmol/L (ref 135–145)

## 2019-11-09 LAB — BRAIN NATRIURETIC PEPTIDE: B Natriuretic Peptide: 239.1 pg/mL — ABNORMAL HIGH (ref 0.0–100.0)

## 2019-11-09 MED ORDER — METOPROLOL SUCCINATE ER 50 MG PO TB24: 50.0000 mg | ORAL_TABLET | Freq: Two times a day (BID) | ORAL | 5 refills | Status: DC

## 2019-11-09 NOTE — Patient Instructions (Signed)
INCREASE Toprol XL to 50mg  TWICE A DAY    Labs today We will only contact you if something comes back abnormal or we need to make some changes. Otherwise no news is good news!    Your physician recommends that you schedule a follow-up appointment in: 3 months with an ECHO   Your physician has requested that you have an echocardiogram. Echocardiography is a painless test that uses sound waves to create images of your heart. It provides your doctor with information about the size and shape of your heart and how well your heart's chambers and valves are working. This procedure takes approximately one hour. There are no restrictions for this procedure.   Please call office at [phone] option 2 if you have any questions or concerns.    At the [facility], you and your health needs are our priority. As part of our continuing mission to provide you with exceptional heart care, we have created designated Provider Care Teams. These Care Teams include your primary Cardiologist (physician) and Advanced Practice Providers (APPs- Physician Assistants and Nurse Practitioners) who all work together to provide you with the care you need, when you need it.   You may see any of the following providers on your designated Care Team at your next follow up: Marland Kitchen Dr Glori Bickers . Dr Loralie Champagne . Darrick Grinder, NP . Lyda Jester, PA . Audry Riles, PharmD   Please be sure to bring in all your medications bottles to every appointment.

## 2019-11-09 NOTE — Progress Notes (Signed)
PCP: Dr. Moshe Cipro Cardiology: Dr. Rayann Heman HF Cardiology: Dr. Aundra Dubin  74 y.o. returns for followup of CHF.   Dr. Rayann Heman has followed her for a short RP SVT.  This has been quiescent recently on Toprol XL.  No palpitations.   Patient was initially found to have low EF in 2014. Repeat echo in 6/17 showed EF 35-40%. Cardiolite in 8/17 showed fixed apical defect but no evidence for ischemia. She was admitted in 1/18 with PNA, DVT, and CHF exacerbation.  Echo at that time showed EF down to 20-25%.  Troponin was mildly elevated to around 0.6. She was diuresed and discharged.   Patient was diagnosed with breast cancer in 2008 and had lumpectomy, radiation, and chemotherapy which included epirubicin, and anthracycline.  Recurrent cancer in 2019 with right mastectomy and chemo with cyclophosphamide, MTX, and fluorouracil.   She had RHC/LHC in 10/18 showing no significant CAD and mildly elevate PCWP.  Cardiac MRI in 11/18 showed EF 49%, no LGE.   She has been stable symptomatically.  She is short of breath walking up a flight of stairs, no problems on flat ground.  No orthopnea/PND.  No chest pain.  No lightheadedness.  Taking all her meds.   Labs (7/18): K 4.3, creatinine 1.16, TSH normal, LDL 101 Labs (8/18): K 5.3, creatinine 1.45 Labs (10/18): K 4.3, creatinine 1.26  PMH: 1. 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  2. DVT in 1/18 in setting of PNA.  She was on Xarelto for about 6 months.  3. Breast cancer: Diagnosed on right in 2008.  She had lumpectomy, radiation, and chemotherapy.  She had fluorouracil, epirubicin, Cytoxan.  - Recent mammogram with right breast calcifications => biopsy with complex sclerosing lesion.  - Recurrent cancer 2019, she had right mastectomy and chemotherapy with cyclophosphamide, MTX, fluorouracil.  4. GERD 5. Hyperlipidemia 6. Chronic systolic CHF: Uncertain cause of cardiomyopathy.  Echo in 2014 showed EF 40%.  It is possible that it is related to epirubicin use  (anthracycline).  - Echo (6/17): EF 35-40%, mild LV dilation.  - Cardiolite (8/17): no ischemia, apical lateral/apical fixed perfusion defect.  - Echo (1/18): EF 20-25%, moderate MR, severe TR, D-shaped IV septum, PASP 35 mmHg.  - RHC/LHC (10/18): No angiographic CAD.  Mean RA 5, PA 44/11 mean 26, mean PCWP 18, CI 3.49.  - Cardiac MRI (11/18): EF 49%, diffuse mild hypokinesis, mild RV dilation with normal function, no LGE.   FH: Colon cancer.  No known heart disease.   SH: Married, never smoked.  Lives in [place].  Retired Marine scientist.    ROS: All systems reviewed and negative except as per HPI.   Current Outpatient Medications  Medication Sig Dispense Refill  . acetaminophen (TYLENOL) 500 MG tablet Take 500-1,000 mg by mouth every 6 (six) hours as needed (FOR PAIN.).    Marland Kitchen aspirin EC 81 MG tablet Take 81 mg by mouth daily.     Marland Kitchen azelastine (ASTELIN) 0.1 % nasal spray Place 2 sprays into both nostrils 2 (two) times daily. Use in each nostril as directed (Patient taking differently: Place 2 sprays into both nostrils 2 (two) times daily as needed (cold symptoms). Use in each nostril as directed) 30 mL 12  . clonazePAM (KLONOPIN) 0.5 MG tablet TAKE (1) TABLET BY MOUTH AT BEDTIME. 90 tablet 0  . docusate sodium (COLACE) 100 MG capsule Take 200 mg by mouth 2 (two) times daily.     Marland Kitchen ENTRESTO 24-26 MG TAKE (1) TABLET BY MOUTH TWICE  DAILY. 60 tablet 11  . ergocalciferol (VITAMIN D2) 1.25 MG (50000 UT) capsule Take 1 capsule (50,000 Units total) by mouth once a week. One capsule once weekly 12 capsule 1  . lovastatin (MEVACOR) 40 MG tablet TAKE 1 TABLET BY MOUTH AT BEDTIME 90 tablet 2  . metoCLOPramide (REGLAN) 10 MG tablet Take 1 tablet (10 mg total) by mouth 2 (two) times daily. 180 tablet 0  . metoprolol succinate (TOPROL-XL) 50 MG 24 hr tablet Take 1 tablet (50 mg total) by mouth 2 (two) times daily. Take with or immediately following a meal. 60 tablet 5  . montelukast (SINGULAIR) 10 MG tablet TAKE  ONE TABLET BY MOUTH AT BEDTIME. 30 tablet 0  . pantoprazole (PROTONIX) 40 MG tablet Take 1 tablet (40 mg total) by mouth 2 (two) times daily. 180 tablet 0  . sertraline (ZOLOFT) 25 MG tablet TAKE 1 TABLET BY MOUTH ONCE A DAY. 30 tablet 0  . spironolactone (ALDACTONE) 25 MG tablet Take 1 tablet (25 mg total) by mouth daily. 90 tablet 3  . zolpidem (AMBIEN) 10 MG tablet Take 1 tablet (10 mg total) by mouth at bedtime. 30 tablet 4   No current facility-administered medications for this encounter.   BP 130/67   Pulse 72   Wt 86.2 kg (190 lb)   SpO2 94%   BMI 41.12 kg/m  General: NAD Neck: No JVD, no thyromegaly or thyroid nodule.  Lungs: Clear to auscultation bilaterally with normal respiratory effort. CV: Nondisplaced PMI.  Heart regular S1/S2, no S3/S4, no murmur.  No peripheral edema.  No carotid bruit.  Normal pedal pulses.  Abdomen: Soft, nontender, no hepatosplenomegaly, no distention.  Skin: Intact without lesions or rashes.  Neurologic: Alert and oriented x 3.  Psych: Normal affect. Extremities: No clubbing or cyanosis.  HEENT: Normal.   Assessment/Plan: 1. Chronic systolic CHF: Long-standing cardiomyopathy, since at least 2014.  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  Could be due to epirubicin exposure with 2008 breast cancer treatment.  Cardiac MRI in 11/18 showed EF up to 49% with no LGE.  Euvolemic on exam today, NYHA class II symptoms.   - Continue spironolactone.  - Increase Toprol XL to 50 mg bid.  - Creatinine rose with increase in Entresto to 49/52 bid, so keep at 24/26 bid.  BMET today.  - On MRI in 11/18, EF was out of ICD range.   - I will have her get an echo to reassess EF, will do at followup in 3 months.   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  Continue Toprol XL.   Followup in 3 months with echo.   Loralie Champagne 11/09/2019

## 2019-11-15 ENCOUNTER — Other Ambulatory Visit: Payer: Self-pay | Admitting: Family Medicine

## 2019-11-29 ENCOUNTER — Other Ambulatory Visit: Payer: Self-pay | Admitting: Family Medicine

## 2019-12-08 ENCOUNTER — Ambulatory Visit: Payer: Medicare Other | Attending: Internal Medicine

## 2019-12-08 DIAGNOSIS — Z23 Encounter for immunization: Secondary | ICD-10-CM

## 2019-12-08 NOTE — Progress Notes (Signed)
   Covid-19 Vaccination Clinic  Name:  Tara [name]    MRN: [id number] DOB: [birth year]  12/08/2019  Tara [name] was observed post Covid-19 immunization for 15 minutes without incident. She was provided with Vaccine Information Sheet and instruction to access the V-Safe system.   Tara [name] was instructed to call 911 with any severe reactions post vaccine: Marland Kitchen Difficulty breathing  . Swelling of face and throat  . A fast heartbeat  . A bad rash all over body  . Dizziness and weakness   Immunizations Administered    Name Date Dose VIS Date Route   Pfizer COVID-19 Vaccine 12/08/2019 12:41 PM 0.3 mL 09/02/2019 Intramuscular   Manufacturer: [place]   Lot: [id number]   [place]: [id number]

## 2019-12-12 ENCOUNTER — Other Ambulatory Visit (HOSPITAL_COMMUNITY): Payer: Self-pay | Admitting: Cardiology

## 2019-12-12 ENCOUNTER — Telehealth: Payer: Self-pay | Admitting: *Deleted

## 2019-12-12 ENCOUNTER — Other Ambulatory Visit: Payer: Self-pay | Admitting: Family Medicine

## 2019-12-12 NOTE — Telephone Encounter (Signed)
Pt requesting refill for clonazepam

## 2019-12-13 ENCOUNTER — Other Ambulatory Visit: Payer: Self-pay | Admitting: Family Medicine

## 2019-12-13 MED ORDER — CLONAZEPAM 0.5 MG PO TABS: 0.5000 mg | ORAL_TABLET | Freq: Every day | ORAL | 1 refills | Status: DC

## 2019-12-13 NOTE — Telephone Encounter (Signed)
Medication is prescribed.

## 2019-12-15 ENCOUNTER — Ambulatory Visit: Payer: Medicare Other | Admitting: Family Medicine

## 2020-01-02 ENCOUNTER — Ambulatory Visit: Payer: Medicare Other | Attending: Internal Medicine

## 2020-01-02 DIAGNOSIS — Z23 Encounter for immunization: Secondary | ICD-10-CM

## 2020-01-02 NOTE — Progress Notes (Signed)
   Covid-19 Vaccination Clinic  Name:  Tara [name]    MRN: [id number] DOB: [birth year]  01/02/2020  [name] was observed post Covid-19 immunization for 15 minutes without incident. She was provided with Vaccine Information Sheet and instruction to access the V-Safe system.   [name] was instructed to call 911 with any severe reactions post vaccine: Marland Kitchen Difficulty breathing  . Swelling of face and throat  . A fast heartbeat  . A bad rash all over body  . Dizziness and weakness   Immunizations Administered    Name Date Dose VIS Date Route   Pfizer COVID-19 Vaccine 01/02/2020 10:50 AM 0.3 mL 09/02/2019 Intramuscular   Manufacturer: [employer]   Lot: [id number]   [place]: [id number]

## 2020-01-12 ENCOUNTER — Other Ambulatory Visit: Payer: Self-pay | Admitting: Family Medicine

## 2020-01-17 ENCOUNTER — Other Ambulatory Visit: Payer: Self-pay | Admitting: Family Medicine

## 2020-01-23 ENCOUNTER — Telehealth: Payer: Self-pay

## 2020-01-23 NOTE — Telephone Encounter (Signed)
RN returned call, no answer, voicemail full.

## 2020-02-10 ENCOUNTER — Ambulatory Visit (HOSPITAL_BASED_OUTPATIENT_CLINIC_OR_DEPARTMENT_OTHER)
Admission: RE | Admit: 2020-02-10 | Discharge: 2020-02-10 | Disposition: A | Discharge status: Home / Self Care | Payer: Medicare Other | Source: Ambulatory Visit | Attending: Cardiology | Admitting: Cardiology

## 2020-02-10 ENCOUNTER — Encounter (HOSPITAL_COMMUNITY): Payer: Self-pay | Admitting: Cardiology

## 2020-02-10 ENCOUNTER — Ambulatory Visit (HOSPITAL_COMMUNITY)
Admission: RE | Admit: 2020-02-10 | Discharge: 2020-02-10 | Disposition: A | Discharge status: Home / Self Care | Payer: Medicare Other | Source: Ambulatory Visit | Attending: Internal Medicine | Admitting: Internal Medicine

## 2020-02-10 ENCOUNTER — Inpatient Hospital Stay: Payer: Medicare Other | Admitting: Hematology and Oncology

## 2020-02-10 ENCOUNTER — Other Ambulatory Visit: Payer: Self-pay

## 2020-02-10 VITALS — BP 114/60 | HR 59 | Ht <= 58 in | Wt 196.0 lb

## 2020-02-10 DIAGNOSIS — K219 Gastro-esophageal reflux disease without esophagitis: Secondary | ICD-10-CM | POA: Diagnosis not present

## 2020-02-10 DIAGNOSIS — Z7982 Long term (current) use of aspirin: Secondary | ICD-10-CM | POA: Insufficient documentation

## 2020-02-10 DIAGNOSIS — I471 Supraventricular tachycardia: Secondary | ICD-10-CM | POA: Insufficient documentation

## 2020-02-10 DIAGNOSIS — Z86718 Personal history of other venous thrombosis and embolism: Secondary | ICD-10-CM | POA: Insufficient documentation

## 2020-02-10 DIAGNOSIS — E785 Hyperlipidemia, unspecified: Secondary | ICD-10-CM | POA: Diagnosis not present

## 2020-02-10 DIAGNOSIS — I428 Other cardiomyopathies: Secondary | ICD-10-CM | POA: Insufficient documentation

## 2020-02-10 DIAGNOSIS — Z7901 Long term (current) use of anticoagulants: Secondary | ICD-10-CM | POA: Insufficient documentation

## 2020-02-10 DIAGNOSIS — Z79899 Other long term (current) drug therapy: Secondary | ICD-10-CM | POA: Diagnosis not present

## 2020-02-10 DIAGNOSIS — I5022 Chronic systolic (congestive) heart failure: Secondary | ICD-10-CM | POA: Insufficient documentation

## 2020-02-10 DIAGNOSIS — I110 Hypertensive heart disease with heart failure: Secondary | ICD-10-CM | POA: Diagnosis not present

## 2020-02-10 LAB — BASIC METABOLIC PANEL
Anion gap: 7 (ref 5–15)
BUN: 19 mg/dL (ref 8–23)
CO2: 21 mmol/L — ABNORMAL LOW (ref 22–32)
Calcium: 8.8 mg/dL — ABNORMAL LOW (ref 8.9–10.3)
Chloride: 112 mmol/L — ABNORMAL HIGH (ref 98–111)
Creatinine, Ser: 1.43 mg/dL — ABNORMAL HIGH (ref 0.44–1.00)
GFR calc Af Amer: 42 mL/min — ABNORMAL LOW (ref >60)
GFR calc non Af Amer: 36 mL/min — ABNORMAL LOW (ref >60)
Glucose, Bld: 103 mg/dL — ABNORMAL HIGH (ref 70–99)
Potassium: 4.6 mmol/L (ref 3.5–5.1)
Sodium: 140 mmol/L (ref 135–145)

## 2020-02-10 NOTE — Patient Instructions (Signed)
It was great to see you today! No medication changes are needed at this time.  Labs today We will only contact you if something comes back abnormal or we need to make some changes. Otherwise no news is good news!  Your physician recommends that you schedule a follow-up appointment in: [place]  Do the following things EVERYDAY: 1) Weigh yourself in the morning before breakfast. Write it down and keep it in a log. 2) Take your medicines as prescribed 3) Eat low salt foods-Limit salt (sodium) to 2000 mg per day.  4) Stay as active as you can everyday 5) Limit all fluids for the day to less than 2 liters  At the [facility], you and your health needs are our priority. As part of our continuing mission to provide you with exceptional heart care, we have created designated Provider Care Teams. These Care Teams include your primary Cardiologist (physician) and Advanced Practice Providers (APPs- Physician Assistants and Nurse Practitioners) who all work together to provide you with the care you need, when you need it.   You may see any of the following providers on your designated Care Team at your next follow up: Marland Kitchen Dr Glori Bickers . Dr Loralie Champagne . Darrick Grinder, NP . Lyda Jester, PA . Audry Riles, PharmD   Please be sure to bring in all your medications bottles to every appointment.

## 2020-02-10 NOTE — Progress Notes (Signed)
Echocardiogram 2D Echocardiogram has been performed.  Oneal Deputy Malasia Torain 02/10/2020, 1:16 PM

## 2020-02-12 NOTE — Progress Notes (Signed)
PCP: Dr. Moshe Cipro Cardiology: Dr. Rayann Heman HF Cardiology: Dr. Aundra Dubin  74 y.o. returns for followup of CHF.   Dr. Rayann Heman has followed her for a short RP SVT.  This has been quiescent recently on Toprol XL.  No palpitations.   Patient was initially found to have low EF in 2014. Repeat echo in 6/17 showed EF 35-40%. Cardiolite in 8/17 showed fixed apical defect but no evidence for ischemia. She was admitted in 1/18 with PNA, DVT, and CHF exacerbation.  Echo at that time showed EF down to 20-25%.  Troponin was mildly elevated to around 0.6. She was diuresed and discharged.   Patient was diagnosed with breast cancer in 2008 and had lumpectomy, radiation, and chemotherapy which included epirubicin, and anthracycline.  Recurrent cancer in 2019 with right mastectomy and chemo with cyclophosphamide, MTX, and fluorouracil.   She had RHC/LHC in 10/18 showing no significant CAD and mildly elevate PCWP.  Cardiac MRI in 11/18 showed EF 49%, no LGE.   Echo was done today, showing EF 45%, global hypokinesis, mildly decreased RV systolic function.   She has been stable symptomatically.  No dyspnea walking at a normal pace.  Mild dyspnea walking fast up stairs.  No chest pain.  No lightheadedness.  No orthopnea/PND.    ECG (personally reviewed): NSR, left axis deviation, nonspecific T wave flattening  Labs (7/18): K 4.3, creatinine 1.16, TSH normal, LDL 101 Labs (8/18): K 5.3, creatinine 1.45 Labs (10/18): K 4.3, creatinine 1.26 Labs (2/21): K 3.6, creatinine 1.45, BNP 239  PMH: 1. 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  2. DVT in 1/18 in setting of PNA.  She was on Xarelto for about 6 months.  3. Breast cancer: Diagnosed on right in 2008.  She had lumpectomy, radiation, and chemotherapy.  She had fluorouracil, epirubicin, Cytoxan.  - Recent mammogram with right breast calcifications => biopsy with complex sclerosing lesion.  - Recurrent cancer 2019, she had right mastectomy and chemotherapy with  cyclophosphamide, MTX, fluorouracil.  4. GERD 5. Hyperlipidemia 6. Chronic systolic CHF: Uncertain cause of cardiomyopathy.  Echo in 2014 showed EF 40%.  It is possible that it is related to epirubicin use (anthracycline).  - Echo (6/17): EF 35-40%, mild LV dilation.  - Cardiolite (8/17): no ischemia, apical lateral/apical fixed perfusion defect.  - Echo (1/18): EF 20-25%, moderate MR, severe TR, D-shaped IV septum, PASP 35 mmHg.  - RHC/LHC (10/18): No angiographic CAD.  Mean RA 5, PA 44/11 mean 26, mean PCWP 18, CI 3.49.  - Cardiac MRI (11/18): EF 49%, diffuse mild hypokinesis, mild RV dilation with normal function, no LGE.  - Echo (5/21): EF 45%, global hypokinesis, mildly decreased RV systolic function.   FH: Colon cancer.  No known heart disease.   SH: Married, never smoked.  Lives in [place].  Retired Marine scientist.    ROS: All systems reviewed and negative except as per HPI.   Current Outpatient Medications  Medication Sig Dispense Refill  . acetaminophen (TYLENOL) 500 MG tablet Take 500-1,000 mg by mouth every 6 (six) hours as needed (FOR PAIN.).    Marland Kitchen aspirin EC 81 MG tablet Take 81 mg by mouth daily.     Marland Kitchen azelastine (ASTELIN) 0.1 % nasal spray Place 2 sprays into both nostrils 2 (two) times daily. Use in each nostril as directed (Patient taking differently: Place 2 sprays into both nostrils 2 (two) times daily as needed (cold symptoms). Use in each nostril as directed) 30 mL 12  . clonazePAM (KLONOPIN) 0.5  MG tablet Take 1 tablet (0.5 mg total) by mouth at bedtime. 90 tablet 1  . docusate sodium (COLACE) 100 MG capsule Take 200 mg by mouth 2 (two) times daily.     Marland Kitchen ENTRESTO 24-26 MG TAKE (1) TABLET BY MOUTH TWICE DAILY. 60 tablet 6  . lovastatin (MEVACOR) 40 MG tablet TAKE 1 TABLET BY MOUTH AT BEDTIME 90 tablet 2  . metoCLOPramide (REGLAN) 10 MG tablet TAKE ONE TABLET BY MOUTH TWICE DAILY. 180 tablet 0  . metoprolol succinate (TOPROL-XL) 50 MG 24 hr tablet Take 1 tablet (50 mg total)  by mouth 2 (two) times daily. Take with or immediately following a meal. 60 tablet 5  . montelukast (SINGULAIR) 10 MG tablet TAKE ONE TABLET BY MOUTH AT BEDTIME. 30 tablet 5  . pantoprazole (PROTONIX) 40 MG tablet TAKE (1) TABLET BY MOUTH TWICE DAILY. 180 tablet 0  . sertraline (ZOLOFT) 25 MG tablet TAKE 1 TABLET BY MOUTH ONCE A DAY. 30 tablet 5  . spironolactone (ALDACTONE) 25 MG tablet Take 1 tablet (25 mg total) by mouth daily. 90 tablet 3  . Vitamin D, Ergocalciferol, (DRISDOL) 1.25 MG (50000 UNIT) CAPS capsule TAKE 1 CAPSULE BY MOUTH ONCE WEEKLY. 12 capsule 0  . zolpidem (AMBIEN) 10 MG tablet Take 1 tablet (10 mg total) by mouth at bedtime. 30 tablet 4   No current facility-administered medications for this encounter.   BP 114/60   Pulse (!) 59   Ht 4\' 9"  (1.448 m)   Wt 88.9 kg (196 lb)   SpO2 95%   BMI 42.41 kg/m  General: NAD Neck: No JVD, no thyromegaly or thyroid nodule.  Lungs: Clear to auscultation bilaterally with normal respiratory effort. CV: Nondisplaced PMI.  Heart regular S1/S2, no S3/S4, no murmur.  No peripheral edema.  No carotid bruit.  Normal pedal pulses.  Abdomen: Soft, nontender, no hepatosplenomegaly, no distention.  Skin: Intact without lesions or rashes.  Neurologic: Alert and oriented x 3.  Psych: Normal affect. Extremities: No clubbing or cyanosis.  HEENT: Normal.   Assessment/Plan: 1. Chronic systolic CHF: Long-standing cardiomyopathy, since at least 2014.  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  Could be due to epirubicin exposure with 2008 breast cancer treatment.  Cardiac MRI in 11/18 showed EF up to 49% with no LGE.  Echo was done today and reviewed, EF remains in the 45% range.  Euvolemic on exam today, NYHA class II symptoms.   - Continue spironolactone 25 mg daily.   - Continue Toprol XL 50 mg bid.  - Creatinine rose with increase in Entresto to 49/52 bid, so keep at 24/26 bid.  BMET today.  - EF out of ICD range on today's echo.    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  Continue Toprol XL.   Followup in 4 months.    Loralie Champagne 02/12/2020

## 2020-02-13 ENCOUNTER — Other Ambulatory Visit: Payer: Self-pay | Admitting: Family Medicine

## 2020-02-13 NOTE — Telephone Encounter (Signed)
Pt is requesting a refill. 

## 2020-02-15 ENCOUNTER — Other Ambulatory Visit: Payer: Self-pay | Admitting: Family Medicine

## 2020-02-15 DIAGNOSIS — Z1231 Encounter for screening mammogram for malignant neoplasm of breast: Secondary | ICD-10-CM

## 2020-02-16 ENCOUNTER — Other Ambulatory Visit: Payer: Self-pay | Admitting: Family Medicine

## 2020-02-16 MED ORDER — ZOLPIDEM TARTRATE 10 MG PO TABS: 10.0000 mg | ORAL_TABLET | Freq: Every day | ORAL | 0 refills | Status: DC

## 2020-02-28 ENCOUNTER — Other Ambulatory Visit: Payer: Self-pay | Admitting: Family Medicine

## 2020-03-08 ENCOUNTER — Ambulatory Visit: Payer: Medicare Other

## 2020-03-12 ENCOUNTER — Other Ambulatory Visit: Payer: Self-pay | Admitting: Family Medicine

## 2020-03-14 ENCOUNTER — Telehealth: Payer: Self-pay | Admitting: Family Medicine

## 2020-03-14 NOTE — Telephone Encounter (Signed)
Pt LVM need Rx zolpidem refill

## 2020-03-22 ENCOUNTER — Other Ambulatory Visit: Payer: Self-pay

## 2020-03-22 ENCOUNTER — Ambulatory Visit (INDEPENDENT_AMBULATORY_CARE_PROVIDER_SITE_OTHER): Payer: Medicare Other | Admitting: Family Medicine

## 2020-03-22 ENCOUNTER — Other Ambulatory Visit (HOSPITAL_COMMUNITY)
Admission: RE | Admit: 2020-03-22 | Discharge: 2020-03-22 | Disposition: A | Discharge status: Home / Self Care | Payer: Medicare Other | Source: Other Acute Inpatient Hospital | Attending: Family Medicine | Admitting: Family Medicine

## 2020-03-22 ENCOUNTER — Encounter: Payer: Self-pay | Admitting: Family Medicine

## 2020-03-22 VITALS — BP 132/64 | HR 60 | Temp 97.8°F | Resp 16 | Ht <= 58 in | Wt 196.0 lb

## 2020-03-22 DIAGNOSIS — E559 Vitamin D deficiency, unspecified: Secondary | ICD-10-CM

## 2020-03-22 DIAGNOSIS — E8881 Metabolic syndrome: Secondary | ICD-10-CM

## 2020-03-22 DIAGNOSIS — I10 Essential (primary) hypertension: Secondary | ICD-10-CM

## 2020-03-22 DIAGNOSIS — J302 Other seasonal allergic rhinitis: Secondary | ICD-10-CM

## 2020-03-22 DIAGNOSIS — Z79899 Other long term (current) drug therapy: Secondary | ICD-10-CM

## 2020-03-22 DIAGNOSIS — F5105 Insomnia due to other mental disorder: Secondary | ICD-10-CM

## 2020-03-22 DIAGNOSIS — F324 Major depressive disorder, single episode, in partial remission: Secondary | ICD-10-CM | POA: Diagnosis not present

## 2020-03-22 DIAGNOSIS — Z1211 Encounter for screening for malignant neoplasm of colon: Secondary | ICD-10-CM

## 2020-03-22 DIAGNOSIS — E7800 Pure hypercholesterolemia, unspecified: Secondary | ICD-10-CM

## 2020-03-22 DIAGNOSIS — I471 Supraventricular tachycardia: Secondary | ICD-10-CM

## 2020-03-22 MED ORDER — CLONAZEPAM 0.5 MG PO TABS: 0.5000 mg | ORAL_TABLET | Freq: Every day | ORAL | 1 refills | Status: DC

## 2020-03-22 NOTE — Telephone Encounter (Signed)
Discontinued at oV today

## 2020-03-22 NOTE — Patient Instructions (Signed)
Annual physical exam Nov 17 or shortly after, callmif you need me  Sooner  Fasting CBC, lipid, cmp and eGFr, TSH and vit D today  No more zolpidem , use melatonin 5 mg one at bedtime instead, please  Urine to UDS today please, ( zolpidem not on list)  You are referred for colonoscopy, very important you get this   It is important that you exercise regularly at least 30 minutes 5 times a week. If you develop chest pain, have severe difficulty breathing, or feel very tired, stop exercising immediately and seek medical attention  Think about what you will eat, plan ahead. Choose " clean, green, fresh or frozen" over canned, processed or packaged foods which are more sugary, salty and fatty. 70 to 75% of food eaten should be vegetables and fruit. Three meals at set times with snacks allowed between meals, but they must be fruit or vegetables. Aim to eat over a 12 hour period , example 7 am to 7 pm, and STOP after  your last meal of the day. Drink water,generally about 64 ounces per day, no other drink is as healthy. Fruit juice is best enjoyed in a healthy way, by EATING the fruit. Thanks for choosing [facility], we consider it a privelige to serve you.

## 2020-03-23 ENCOUNTER — Encounter: Payer: Self-pay | Admitting: Family Medicine

## 2020-03-23 ENCOUNTER — Ambulatory Visit
Admission: RE | Admit: 2020-03-23 | Discharge: 2020-03-23 | Disposition: A | Discharge status: Home / Self Care | Payer: Medicare Other | Source: Ambulatory Visit | Attending: Family Medicine | Admitting: Family Medicine

## 2020-03-23 DIAGNOSIS — Z1231 Encounter for screening mammogram for malignant neoplasm of breast: Secondary | ICD-10-CM

## 2020-03-23 LAB — LIPID PANEL
Cholesterol: 188 mg/dL (ref <200)
HDL: 50 mg/dL (ref >=50)
LDL Cholesterol (Calc): 119 mg/dL (calc) — ABNORMAL HIGH
Non-HDL Cholesterol (Calc): 138 mg/dL (calc) — ABNORMAL HIGH (ref <130)
Total CHOL/HDL Ratio: 3.8 (calc) (ref <5.0)
Triglycerides: 87 mg/dL (ref <150)

## 2020-03-23 LAB — COMPLETE METABOLIC PANEL WITH GFR
AG Ratio: 1.2 (calc) (ref 1.0–2.5)
ALT: 7 U/L (ref 6–29)
AST: 12 U/L (ref 10–35)
Albumin: 3.9 g/dL (ref 3.6–5.1)
Alkaline phosphatase (APISO): 87 U/L (ref 37–153)
BUN/Creatinine Ratio: 16 (calc) (ref 6–22)
BUN: 23 mg/dL (ref 7–25)
CO2: 23 mmol/L (ref 20–32)
Calcium: 9.1 mg/dL (ref 8.6–10.4)
Chloride: 108 mmol/L (ref 98–110)
Creat: 1.47 mg/dL — ABNORMAL HIGH (ref 0.60–0.93)
GFR, Est African American: 40 mL/min/{1.73_m2} — ABNORMAL LOW (ref >=60)
GFR, Est Non African American: 35 mL/min/{1.73_m2} — ABNORMAL LOW (ref >=60)
Globulin: 3.2 g/dL (calc) (ref 1.9–3.7)
Glucose, Bld: 93 mg/dL (ref 65–99)
Potassium: 4.9 mmol/L (ref 3.5–5.3)
Sodium: 140 mmol/L (ref 135–146)
Total Bilirubin: 0.3 mg/dL (ref 0.2–1.2)
Total Protein: 7.1 g/dL (ref 6.1–8.1)

## 2020-03-23 LAB — CBC
HCT: 35.7 % (ref 35.0–45.0)
Hemoglobin: 11.5 g/dL — ABNORMAL LOW (ref 11.7–15.5)
MCH: 27.2 pg (ref 27.0–33.0)
MCHC: 32.2 g/dL (ref 32.0–36.0)
MCV: 84.4 fL (ref 80.0–100.0)
MPV: 10.2 fL (ref 7.5–12.5)
Platelets: 276 10*3/uL (ref 140–400)
RBC: 4.23 10*6/uL (ref 3.80–5.10)
RDW: 14.4 % (ref 11.0–15.0)
WBC: 7.9 10*3/uL (ref 3.8–10.8)

## 2020-03-23 LAB — TSH: TSH: 2.9 mIU/L (ref 0.40–4.50)

## 2020-03-23 LAB — VITAMIN D 25 HYDROXY (VIT D DEFICIENCY, FRACTURES): Vit D, 25-Hydroxy: 87 ng/mL (ref 30–100)

## 2020-03-23 NOTE — Assessment & Plan Note (Signed)
Controlled, no change in medication DASH diet and commitment to daily physical activity for a minimum of 30 minutes discussed and encouraged, as a part of hypertension management. The importance of attaining a healthy weight is also discussed.  BP/Weight 03/22/2020 02/10/2020 02/10/2020 11/09/2019 11/02/2019 08/08/2019 12/11/252  Systolic BP 270 623 762 831 517 616 073  Diastolic BP 64 60 83 67 64 80 80  Wt. (Lbs) 196 196 - 190 190 195 189  BMI 42.41 42.41 - 41.12 41.12 39.39 38.17

## 2020-03-23 NOTE — Assessment & Plan Note (Signed)
Hyperlipidemia:Low fat diet discussed and encouraged.   Lipid Panel  Lab Results  Component Value Date   CHOL 188 03/22/2020   HDL 50 03/22/2020   LDLCALC 119 (H) 03/22/2020   TRIG 87 03/22/2020   CHOLHDL 3.8 03/22/2020     Needs to lower fat intake, will change medication if LDL still elevated at next check

## 2020-03-23 NOTE — Assessment & Plan Note (Signed)
Controlled, no change in medication  

## 2020-03-23 NOTE — Assessment & Plan Note (Signed)
Rate controlled and asymptomatic

## 2020-03-23 NOTE — Progress Notes (Signed)
Tara [name]     MRN: [id number]      DOB: 09-17-1946   HPI Tara [name] is here for follow up and re-evaluation of chronic medical conditions, medication management and review of any available recent lab and radiology data.  Preventive health is updated, specifically  Cancer screening and Immunization.   Questions or concerns regarding consultations or procedures which the PT has had in the interim are  addressed. The PT denies any adverse reactions to current medications since the last visit.  She has not used ambien in 2 weeks , instead using melatonin with good effect  ROS Denies recent fever or chills. Denies sinus pressure, nasal congestion, ear pain or sore throat. Denies chest congestion, productive cough or wheezing. Denies chest pains, palpitations and leg swelling Denies abdominal pain, nausea, vomiting,diarrhea or constipation.   Denies dysuria, frequency, hesitancy or incontinence. Denies joint pain, swelling and limitation in mobility. Denies headaches, seizures, numbness, or tingling. Denies uncontrolled depression, anxiety or insomnia. Denies skin break down or rash.   PE  BP 132/64   Pulse 60   Temp 97.8 F (36.6 C)   Resp 16   Ht 4\' 9"  (1.448 m)   Wt 196 lb (88.9 kg)   SpO2 93%   BMI 42.41 kg/m   Patient alert and oriented and in no cardiopulmonary distress.  HEENT: No facial asymmetry, EOMI,     Neck supple .  Chest: Clear to auscultation bilaterally.  CVS: S1, S2 no murmurs, no S3.Regular rate.  ABD: Soft non tender.   Ext: No edema  MS: Adequate ROM spine, shoulders, hips and knees.  Skin: Intact, no ulcerations or rash noted.  Psych: Good eye contact, normal affect. Memory intact not anxious or depressed appearing.  CNS: CN 2-12 intact, power,  normal throughout.no focal deficits noted.   Assessment & Plan  Insomnia related to another mental disorder Controlled with klonopin and melatonin, d/c ambien, sleep hygiene  reviewed  Hypertension Controlled, no change in medication DASH diet and commitment to daily physical activity for a minimum of 30 minutes discussed and encouraged, as a part of hypertension management. The importance of attaining a healthy weight is also discussed.  BP/Weight 03/22/2020 02/10/2020 02/10/2020 11/09/2019 11/02/2019 08/08/2019 8/93/8101  Systolic BP 751 025 852 778 242 353 614  Diastolic BP 64 60 83 67 64 80 80  Wt. (Lbs) 196 196 - 190 190 195 189  BMI 42.41 42.41 - 41.12 41.12 39.39 38.17       Morbid obesity (HCC)  Patient re-educated about  the importance of commitment to a  minimum of 150 minutes of exercise per week as able.  The importance of healthy food choices with portion control discussed, as well as eating regularly and within a 12 hour window most days. The need to choose "clean , green" food 50 to 75% of the time is discussed, as well as to make water the primary drink and set a goal of 64 ounces water daily.    Weight /BMI 03/22/2020 02/10/2020 11/09/2019  WEIGHT 196 lb 196 lb 190 lb  HEIGHT 4\' 9"  4\' 9"  -  BMI 42.41 kg/m2 42.41 kg/m2 41.12 kg/m2      Hyperlipidemia Hyperlipidemia:Low fat diet discussed and encouraged.   Lipid Panel  Lab Results  Component Value Date   CHOL 188 03/22/2020   HDL 50 03/22/2020   LDLCALC 119 (H) 03/22/2020   TRIG 87 03/22/2020   CHOLHDL 3.8 03/22/2020     Needs to lower  fat intake, will change medication if LDL still elevated at next check  Depression, major, single episode, in partial remission (HCC) Controlled, no change in medication   SVT (supraventricular tachycardia) Rate controlled and asymptomatic  Seasonal allergies Controlled, no change in medication

## 2020-03-23 NOTE — Assessment & Plan Note (Signed)
Controlled with klonopin and melatonin, d/c ambien, sleep hygiene reviewed

## 2020-03-23 NOTE — Assessment & Plan Note (Signed)
  Patient re-educated about  the importance of commitment to a  minimum of 150 minutes of exercise per week as able.  The importance of healthy food choices with portion control discussed, as well as eating regularly and within a 12 hour window most days. The need to choose "clean , green" food 50 to 75% of the time is discussed, as well as to make water the primary drink and set a goal of 64 ounces water daily.    Weight /BMI 03/22/2020 02/10/2020 11/09/2019  WEIGHT 196 lb 196 lb 190 lb  HEIGHT 4\' 9"  4\' 9"  -  BMI 42.41 kg/m2 42.41 kg/m2 41.12 kg/m2

## 2020-03-28 ENCOUNTER — Encounter: Payer: Self-pay | Admitting: Internal Medicine

## 2020-03-29 LAB — MISC LABCORP TEST (SEND OUT): Labcorp test code: 738526

## 2020-04-05 ENCOUNTER — Encounter: Payer: Medicare Other | Admitting: Family Medicine

## 2020-04-09 ENCOUNTER — Other Ambulatory Visit: Payer: Self-pay

## 2020-04-10 ENCOUNTER — Telehealth: Payer: Medicare Other

## 2020-04-11 ENCOUNTER — Other Ambulatory Visit (HOSPITAL_COMMUNITY): Payer: Self-pay | Admitting: Cardiology

## 2020-04-11 ENCOUNTER — Other Ambulatory Visit: Payer: Self-pay | Admitting: Family Medicine

## 2020-04-17 NOTE — Progress Notes (Signed)
This encounter was created in error - please disregard.

## 2020-04-23 ENCOUNTER — Other Ambulatory Visit: Payer: Self-pay

## 2020-04-23 ENCOUNTER — Telehealth (INDEPENDENT_AMBULATORY_CARE_PROVIDER_SITE_OTHER): Payer: Medicare Other

## 2020-04-23 VITALS — BP 127/70 | Ht <= 58 in | Wt 196.0 lb

## 2020-04-23 DIAGNOSIS — Z0000 Encounter for general adult medical examination without abnormal findings: Secondary | ICD-10-CM

## 2020-04-23 NOTE — Patient Instructions (Signed)
Tara [name] , Thank you for taking time to come for your Medicare Wellness Visit. I appreciate your ongoing commitment to your health goals. Please review the following plan we discussed and let me know if I can assist you in the future.    Starting in Sept 2021, start taking otc vitamin D 1000 iu daily  Screening recommendations/referrals: Colonoscopy: overdue Mammogram: up to date Bone Density: completed Recommended yearly ophthalmology/optometry visit for glaucoma screening and checkup Recommended yearly dental visit for hygiene and checkup  Vaccinations: Influenza vaccine: up to date- next due in Sept 2021 Pneumococcal vaccine: up to date Tdap vaccine: up to date Shingles vaccine: can get shingrix at pharmacy if wanted      Next appointment: wellness in 1 year   Preventive Care 22 Years and Older, Female Preventive care refers to lifestyle choices and visits with your health care provider that can promote health and wellness. What does preventive care include?  A yearly physical exam. This is also called an annual well check.  Dental exams once or twice a year.  Routine eye exams. Ask your health care provider how often you should have your eyes checked.  Personal lifestyle choices, including:  Daily care of your teeth and gums.  Regular physical activity.  Eating a healthy diet.  Avoiding tobacco and drug use.  Limiting alcohol use.  Practicing safe sex.  Taking low-dose aspirin every day.  Taking vitamin and mineral supplements as recommended by your health care provider. What happens during an annual well check? The services and screenings done by your health care provider during your annual well check will depend on your age, overall health, lifestyle risk factors, and family history of disease. Counseling  Your health care provider may ask you questions about your:  Alcohol use.  Tobacco use.  Drug use.  Emotional well-being.  Home and  relationship well-being.  Sexual activity.  Eating habits.  History of falls.  Memory and ability to understand (cognition).  Work and work Statistician.  Reproductive health. Screening  You may have the following tests or measurements:  Height, weight, and BMI.  Blood pressure.  Lipid and cholesterol levels. These may be checked every 5 years, or more frequently if you are over 71 years old.  Skin check.  Lung cancer screening. You may have this screening every year starting at age 37 if you have a 30-pack-year history of smoking and currently smoke or have quit within the past 15 years.  Fecal occult blood test (FOBT) of the stool. You may have this test every year starting at age 57.  Flexible sigmoidoscopy or colonoscopy. You may have a sigmoidoscopy every 5 years or a colonoscopy every 10 years starting at age 85.  Hepatitis C blood test.  Hepatitis B blood test.  Sexually transmitted disease (STD) testing.  Diabetes screening. This is done by checking your blood sugar (glucose) after you have not eaten for a while (fasting). You may have this done every 1-3 years.  Bone density scan. This is done to screen for osteoporosis. You may have this done starting at age 68.  Mammogram. This may be done every 1-2 years. Talk to your health care provider about how often you should have regular mammograms. Talk with your health care provider about your test results, treatment options, and if necessary, the need for more tests. Vaccines  Your health care provider may recommend certain vaccines, such as:  Influenza vaccine. This is recommended every year.  Tetanus, diphtheria, and acellular  pertussis (Tdap, Td) vaccine. You may need a Td booster every 10 years.  Zoster vaccine. You may need this after age 7.  Pneumococcal 13-valent conjugate (PCV13) vaccine. One dose is recommended after age 62.  Pneumococcal polysaccharide (PPSV23) vaccine. One dose is recommended after  age 69. Talk to your health care provider about which screenings and vaccines you need and how often you need them. This information is not intended to replace advice given to you by your health care provider. Make sure you discuss any questions you have with your health care provider. Document Released: 10/05/2015 Document Revised: 05/28/2016 Document Reviewed: 07/10/2015 Elsevier Interactive Patient Education  2017 [place] Prevention in the Home Falls can cause injuries. They can happen to people of all ages. There are many things you can do to make your home safe and to help prevent falls. What can I do on the outside of my home?  Regularly fix the edges of walkways and driveways and fix any cracks.  Remove anything that might make you trip as you walk through a door, such as a raised step or threshold.  Trim any bushes or trees on the path to your home.  Use bright outdoor lighting.  Clear any walking paths of anything that might make someone trip, such as rocks or tools.  Regularly check to see if handrails are loose or broken. Make sure that both sides of any steps have handrails.  Any raised decks and porches should have guardrails on the edges.  Have any leaves, snow, or ice cleared regularly.  Use sand or salt on walking paths during winter.  Clean up any spills in your garage right away. This includes oil or grease spills. What can I do in the bathroom?  Use night lights.  Install grab bars by the toilet and in the tub and shower. Do not use towel bars as grab bars.  Use non-skid mats or decals in the tub or shower.  If you need to sit down in the shower, use a plastic, non-slip stool.  Keep the floor dry. Clean up any water that spills on the floor as soon as it happens.  Remove soap buildup in the tub or shower regularly.  Attach bath mats securely with double-sided non-slip rug tape.  Do not have throw rugs and other things on the floor that can  make you trip. What can I do in the bedroom?  Use night lights.  Make sure that you have a light by your bed that is easy to reach.  Do not use any sheets or blankets that are too big for your bed. They should not hang down onto the floor.  Have a firm chair that has side arms. You can use this for support while you get dressed.  Do not have throw rugs and other things on the floor that can make you trip. What can I do in the kitchen?  Clean up any spills right away.  Avoid walking on wet floors.  Keep items that you use a lot in easy-to-reach places.  If you need to reach something above you, use a strong step stool that has a grab bar.  Keep electrical cords out of the way.  Do not use floor polish or wax that makes floors slippery. If you must use wax, use non-skid floor wax.  Do not have throw rugs and other things on the floor that can make you trip. What can I do with my stairs?  Do not leave any items on the stairs.  Make sure that there are handrails on both sides of the stairs and use them. Fix handrails that are broken or loose. Make sure that handrails are as long as the stairways.  Check any carpeting to make sure that it is firmly attached to the stairs. Fix any carpet that is loose or worn.  Avoid having throw rugs at the top or bottom of the stairs. If you do have throw rugs, attach them to the floor with carpet tape.  Make sure that you have a light switch at the top of the stairs and the bottom of the stairs. If you do not have them, ask someone to add them for you. What else can I do to help prevent falls?  Wear shoes that:  Do not have high heels.  Have rubber bottoms.  Are comfortable and fit you well.  Are closed at the toe. Do not wear sandals.  If you use a stepladder:  Make sure that it is fully opened. Do not climb a closed stepladder.  Make sure that both sides of the stepladder are locked into place.  Ask someone to hold it for you,  if possible.  Clearly mark and make sure that you can see:  Any grab bars or handrails.  First and last steps.  Where the edge of each step is.  Use tools that help you move around (mobility aids) if they are needed. These include:  Canes.  Walkers.  Scooters.  Crutches.  Turn on the lights when you go into a dark area. Replace any light bulbs as soon as they burn out.  Set up your furniture so you have a clear path. Avoid moving your furniture around.  If any of your floors are uneven, fix them.  If there are any pets around you, be aware of where they are.  Review your medicines with your doctor. Some medicines can make you feel dizzy. This can increase your chance of falling. Ask your doctor what other things that you can do to help prevent falls. This information is not intended to replace advice given to you by your health care provider. Make sure you discuss any questions you have with your health care provider. Document Released: 07/05/2009 Document Revised: 02/14/2016 Document Reviewed: 10/13/2014 Elsevier Interactive Patient Education  2017 [employer].

## 2020-04-23 NOTE — Progress Notes (Signed)
Subjective:   Tara [name] is a 74 y.o. female who presents for Medicare Annual (Subsequent) preventive examination.  Review of Systems     Cardiac Risk Factors include: advanced age (>14men, >39 women);dyslipidemia;obesity (BMI >30kg/m2);sedentary lifestyle;hypertension     Objective:    Today's Vitals   04/23/20 1325 04/23/20 1326  BP: 127/70   Weight: 196 lb (88.9 kg)   Height: 4\' 9"  (1.448 m)   PainSc: 0-No pain 0-No pain   Body mass index is 42.41 kg/m.  Advanced Directives 10/25/2018 06/10/2018 06/02/2018 07/29/2017 07/24/2017 07/10/2017 04/17/2017  Does Patient Have a Medical Advance Directive? No No No No No No No  Would patient like information on creating a medical advance directive? No - Patient declined No - Patient declined - No - Patient declined No - Patient declined No - Patient declined -  Pre-existing out of facility DNR order (yellow form or pink MOST form) - - - - - - -    Current Medications (verified) Outpatient Encounter Medications as of 04/23/2020  Medication Sig  . acetaminophen (TYLENOL) 500 MG tablet Take 500-1,000 mg by mouth every 6 (six) hours as needed (FOR PAIN.).  Marland Kitchen aspirin EC 81 MG tablet Take 81 mg by mouth daily.   Marland Kitchen azelastine (ASTELIN) 0.1 % nasal spray Place 2 sprays into both nostrils 2 (two) times daily. Use in each nostril as directed (Patient taking differently: Place 2 sprays into both nostrils 2 (two) times daily as needed (cold symptoms). Use in each nostril as directed)  . clonazePAM (KLONOPIN) 0.5 MG tablet Take 1 tablet (0.5 mg total) by mouth at bedtime.  . docusate sodium (COLACE) 100 MG capsule Take 200 mg by mouth 2 (two) times daily.   Marland Kitchen ENTRESTO 24-26 MG TAKE (1) TABLET BY MOUTH TWICE DAILY.  Marland Kitchen lovastatin (MEVACOR) 40 MG tablet TAKE 1 TABLET BY MOUTH AT BEDTIME  . metoCLOPramide (REGLAN) 10 MG tablet TAKE ONE TABLET BY MOUTH TWICE DAILY.  . metoprolol succinate (TOPROL-XL) 50 MG 24 hr tablet TAKE 1 TABLET BY MOUTH TWICE DAILY.    . montelukast (SINGULAIR) 10 MG tablet TAKE ONE TABLET BY MOUTH AT BEDTIME.  . pantoprazole (PROTONIX) 40 MG tablet TAKE (1) TABLET BY MOUTH TWICE DAILY.  Marland Kitchen sertraline (ZOLOFT) 25 MG tablet TAKE 1 TABLET BY MOUTH ONCE A DAY.  Marland Kitchen spironolactone (ALDACTONE) 25 MG tablet Take 1 tablet (25 mg total) by mouth daily.   No facility-administered encounter medications on file as of 04/23/2020.    Allergies (verified) Aspirin, Lisinopril, Sudafed  [pseudoephedrine hcl], and Sudafed [pseudoephedrine hcl]   History: Past Medical History:  Diagnosis Date  . Abnormal mammogram of right breast 07/29/2017  . 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 Anxiety   . Breast cancer [facility]) 2008   right - s/p lumpectomy->chemo, radiation  . Depression   . Dysrhythmia    hx SVT  . Family history of colon cancer   . Family history of prostate cancer   . GERD (gastroesophageal reflux disease)   . H/O: hysterectomy   . Hiatal hernia   . History of cancer chemotherapy   . History of radiation therapy   . Hyperlipidemia   . Hypertension 01/25/2018  . Nonischemic cardiomyopathy ([place])   . Personal history of radiation therapy 01/05/209  . 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[place])   . 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 ABDOMINAL HYSTERECTOMY  1994   fibroids,   . BIOPSY  06/19/2016   Procedure: BIOPSY;  Surgeon: Daneil Dolin, MD;  [facility];  Service: Endoscopy;;  gastric duodenum  . BREAST BIOPSY Right 12/16/2006   malignant  . BREAST EXCISIONAL BIOPSY Right 2018   benign lumpectomy  . BREAST LUMPECTOMY Right 02/2007  . BREAST LUMPECTOMY WITH RADIOACTIVE SEED LOCALIZATION Right 07/29/2017   Procedure: RIGHT BREAST LUMPECTOMY WITH RADIOACTIVE SEED LOCALIZATION;  Surgeon: Fanny Skates, MD;  Location: [place];  Service: General;  Laterality: Right;  . BREAST SURGERY Right 2008   lumpectomy, cancer  . CARDIAC CATHETERIZATION    . CHOLECYSTECTOMY  1999  . COLONOSCOPY  2008   Dr. Oneida Alar: multiple polyps. Path not available at time of visit.   Marland Kitchen COLONOSCOPY WITH PROPOFOL N/A 04/25/2014   Dr. Oneida Alar: Multiple tubular adenomas removed. Diverticulosis. Moderate internal hemorrhoids. Next colonoscopy planned for August 2018.  Marland Kitchen ESOPHAGOGASTRODUODENOSCOPY (EGD) WITH PROPOFOL N/A 06/19/2016   Procedure: ESOPHAGOGASTRODUODENOSCOPY (EGD) WITH PROPOFOL;  Surgeon: Daneil Dolin, MD;  [facility];  Service: Endoscopy;  Laterality: N/A;  . MASTECTOMY W/ SENTINEL NODE BIOPSY Right 06/09/2018   Procedure: RIGHT TOTAL MASTECTOMY WITH SENTINEL LYMPH NODE BIOPSY;  Surgeon: Fanny Skates, MD;  Location: [place];  Service: General;  Laterality: Right;  . POLYPECTOMY N/A 04/25/2014   Procedure: POLYPECTOMY;  Surgeon: Danie Binder, MD;  Location: [facility];  Service: Endoscopy;  Laterality: N/A;  Ascending and Decending Colon x3 , Transverse colon x2, rectal  . PORTACATH PLACEMENT N/A 06/09/2018   Procedure: INSERTION PORT-A-CATH;  Surgeon: Fanny Skates, MD;  Location: [place];  Service: General;  Laterality: N/A;  . RIGHT/LEFT HEART CATH AND CORONARY ANGIOGRAPHY N/A 07/10/2017   Procedure: RIGHT/LEFT HEART CATH AND CORONARY ANGIOGRAPHY;  Surgeon: Larey Dresser, MD;  Location: [place] [facility];  Service: Cardiovascular;  Laterality: N/A;   Family History  Problem Relation Age of Onset  . Colon cancer Father 55       died at age 80  . Hypertension Sister   . Heart disease Sister   . Cancer Sister        unknown form  . Dementia Mother   . Stroke Mother 71       left hemiparesis  . Cancer Brother 13       prostate  . Cancer Paternal Aunt        NOS  . Lung cancer Paternal Uncle   . Other Paternal Uncle        lightning strike  . Other Paternal Uncle        hit by train   . Other Paternal 73        old age  . Cancer Cousin        NOS pat first cousin  . Cancer Cousin        NOS pat first cousin  . Prostate cancer Cousin        pat first cousin   Social History   Socioeconomic History  . Marital status: Married    Spouse name: Not on file  . Number of children: 2  . Years of education: Not on file  . Highest education level: Not on file  Occupational History  . Occupation: disabled     Fish farm manager: UNEMPLOYED  Tobacco Use  . Smoking  status: Never Smoker  . Smokeless tobacco: Never Used  Vaping Use  . Vaping Use: Never used  Substance and Sexual Activity  . Alcohol use: No  . Drug use: No  . Sexual activity: Not on file  Other Topics Concern  . Not on file  Social History Narrative   Lives in [place] with family.  Does not routinely exercise.   Social Determinants of Health   Financial Resource Strain: Low Risk   . Difficulty of Paying Living Expenses: Not hard at all  Food Insecurity: No Food Insecurity  . Worried About Charity fundraiser in the Last Year: Never true  . Ran Out of Food in the Last Year: Never true  Transportation Needs: No Transportation Needs  . Lack of Transportation (Medical): No  . Lack of Transportation (Non-Medical): No  Physical Activity: Insufficiently Active  . Days of Exercise per Week: 4 days  . Minutes of Exercise per Session: 10 min  Stress:   . Feeling of Stress :   Social Connections: Moderately Integrated  . Frequency of Communication with Friends and Family: More than three times a week  . Frequency of Social Gatherings with Friends and Family: Three times a week  . Attends Religious Services: More than 4 times per year  . Active Member of Clubs or Organizations: No  . Attends Archivist Meetings: Never  . Marital Status: Married    Tobacco Counseling Counseling given: Not Answered   Clinical Intake:  Pre-visit preparation completed: Yes  Pain : No/denies pain Pain  Score: 0-No pain     Nutritional Status: BMI > 30  Obese Diabetes: No  How often do you need to have someone help you when you read instructions, pamphlets, or other written materials from your doctor or pharmacy?: 1 - Never What is the last grade level you completed in school?: college  Diabetic? no  Interpreter Needed?: No  Information entered by :: Esaul Dorwart lpn   Activities of Daily Living In your present state of health, do you have any difficulty performing the following activities: 04/23/2020  Hearing? N  Vision? N  Difficulty concentrating or making decisions? N  Walking or climbing stairs? N  Dressing or bathing? N  Doing errands, shopping? N  Preparing Food and eating ? N  Using the Toilet? N  In the past six months, have you accidently leaked urine? N  Do you have problems with loss of bowel control? N  Managing your Medications? N  Managing your Finances? N  Housekeeping or managing your Housekeeping? N  Some recent data might be hidden    Patient Care Team: [name], MD as PCP - Bethena Roys, MD as Consulting Physician (Cardiology) Cottle, Billey Co., MD as Attending Physician (Psychiatry)  Indicate any recent Medical Services you may have received from other than [facility] providers in the past year (date may be approximate).     Assessment:   This is a routine wellness examination for [place].  Hearing/Vision screen No exam data present  Dietary issues and exercise activities discussed: Current Exercise Habits: Home exercise routine, Time (Minutes): 10, Frequency (Times/Week): 4, Weekly Exercise (Minutes/Week): 40, Intensity: Mild, Exercise limited by: respiratory conditions(s)  Goals    . Exercise 3x per week (30 min per time)    . LDL CALC < 130     LDL goal is >100    . Patient Stated     Get 5 year colonoscopy follow up that was  due in 04/2019      Depression Screen PHQ 2/9 Scores 04/23/2020 03/22/2020 04/05/2019 04/05/2019  12/23/2018 08/05/2018 04/27/2018  PHQ - 2 Score 0 0 0 0 0 0 0  PHQ- 9 Score - - 0 - - - 0  Exception Documentation - - Medical reason Medical reason - - -    Fall Risk Fall Risk  04/23/2020 03/22/2020 04/05/2019 08/05/2018 04/27/2018  Falls in the past year? 0 0 0 0 No  Number falls in past yr: 0 - 0 0 -  Injury with Fall? 0 - 0 0 -  Risk Factor Category  - - - - -  Risk for fall due to : - - - - -  Follow up - - - - -    Any stairs in or around the home? Yes  If so, are there any without handrails? No  Home free of loose throw rugs in walkways, pet beds, electrical cords, etc? Yes  Adequate lighting in your home to reduce risk of falls? Yes   ASSISTIVE DEVICES UTILIZED TO PREVENT FALLS:  Life alert? No  Use of a cane, walker or w/c? Yes  Grab bars in the bathroom? Yes  Shower chair or bench in shower? Yes  Elevated toilet seat or a handicapped toilet? No   TIMED UP AND GO:  Was the test performed? No .  Length of time to ambulate 10 feet:  sec.     Cognitive Function:     6CIT Screen 04/23/2020 04/05/2019 04/05/2019  What Year? 0 points 0 points 0 points  What month? 0 points - 0 points  What time? 0 points 0 points 0 points  Count back from 20 0 points 0 points 0 points  Months in reverse 0 points 0 points 0 points  Repeat phrase 0 points 0 points 0 points  Total Score 0 - 0    Immunizations Immunization History  Administered Date(s) Administered  . Fluad Quad(high Dose 65+) 08/08/2019  . PFIZER SARS-COV-2 Vaccination 12/08/2019, 01/02/2020  . Pneumococcal Conjugate-13 12/11/2014  . Pneumococcal Polysaccharide-23 07/03/2016    TDAP status: Up to date Flu Vaccine status: Up to date Pneumococcal vaccine status: Up to date Covid-19 vaccine status: Completed vaccines  Qualifies for Shingles Vaccine? Yes   Zostavax completed Yes   Shingrix Completed?: No.    Education has been provided regarding the importance of this vaccine. Patient has been advised to call insurance  company to determine out of pocket expense if they have not yet received this vaccine. Advised may also receive vaccine at local pharmacy or Health Dept. Verbalized acceptance and understanding.  Screening Tests Health Maintenance  Topic Date Due  . COLONOSCOPY  04/27/2019  . INFLUENZA VACCINE  04/22/2020  . TETANUS/TDAP  07/21/2021  . MAMMOGRAM  03/23/2022  . DEXA SCAN  Completed  . COVID-19 Vaccine  Completed  . Hepatitis C Screening  Completed  . PNA vac Low Risk Adult  Completed    Health Maintenance  Health Maintenance Due  Topic Date Due  . COLONOSCOPY  04/27/2019  . INFLUENZA VACCINE  04/22/2020    Colorectal cancer screening: Completed 2015. Repeat every 5 years Mammogram status: Completed 1. Repeat every year Bone Density status: Completed 2. Results reflect: Bone density results: OSTEOPOROSIS. Repeat every 2 years.  Lung Cancer Screening: (Low Dose CT Chest recommended if Age 31-80 years, 30 pack-year currently smoking OR have quit w/in 15years.) does not qualify.   Lung Cancer Screening Referral: no  Additional Screening:  Hepatitis C Screening: does not qualify; Completed yes  Vision Screening: Recommended annual ophthalmology exams for early detection of glaucoma and other disorders of the eye. Is the patient up to date with their annual eye exam?  Yes  Who is the provider or what is the name of the office in which the patient attends annual eye exams? [facility] If pt is not established with a provider, would they like to be referred to a provider to establish care? No .   Dental Screening: Recommended annual dental exams for proper oral hygiene  Community Resource Referral / Chronic Care Management: CRR required this visit?  No   CCM required this visit?  No      Plan:     I have personally reviewed and noted the following in the patient's chart:   . Medical and social history . Use of alcohol, tobacco or illicit drugs  . Current medications and  supplements . Functional ability and status . Nutritional status . Physical activity . Advanced directives . List of other physicians . Hospitalizations, surgeries, and ER visits in previous 12 months . Vitals . Screenings to include cognitive, depression, and falls . Referrals and appointments  In addition, I have reviewed and discussed with patient certain preventive protocols, quality metrics, and best practice recommendations. A written personalized care plan for preventive services as well as general preventive health recommendations were provided to patient.     Kate Sable, LPN, LPN   [birth year]   Nurse Notes: visit completed by phone with patient at home and provider in the office. Time spent with patient 25 mins

## 2020-05-18 ENCOUNTER — Ambulatory Visit: Payer: Medicare Other | Admitting: Gastroenterology

## 2020-05-30 ENCOUNTER — Ambulatory Visit: Payer: Medicare Other | Admitting: Family Medicine

## 2020-06-06 ENCOUNTER — Other Ambulatory Visit: Payer: Self-pay

## 2020-06-06 ENCOUNTER — Ambulatory Visit (HOSPITAL_COMMUNITY)
Admission: RE | Admit: 2020-06-06 | Discharge: 2020-06-06 | Disposition: A | Discharge status: Home / Self Care | Payer: Medicare Other | Source: Ambulatory Visit | Attending: Cardiology | Admitting: Cardiology

## 2020-06-06 VITALS — BP 135/95 | HR 60 | Wt 195.4 lb

## 2020-06-06 DIAGNOSIS — Z923 Personal history of irradiation: Secondary | ICD-10-CM | POA: Insufficient documentation

## 2020-06-06 DIAGNOSIS — Z79899 Other long term (current) drug therapy: Secondary | ICD-10-CM | POA: Insufficient documentation

## 2020-06-06 DIAGNOSIS — Z853 Personal history of malignant neoplasm of breast: Secondary | ICD-10-CM | POA: Insufficient documentation

## 2020-06-06 DIAGNOSIS — Z9221 Personal history of antineoplastic chemotherapy: Secondary | ICD-10-CM | POA: Diagnosis not present

## 2020-06-06 DIAGNOSIS — I428 Other cardiomyopathies: Secondary | ICD-10-CM | POA: Diagnosis not present

## 2020-06-06 DIAGNOSIS — K219 Gastro-esophageal reflux disease without esophagitis: Secondary | ICD-10-CM | POA: Insufficient documentation

## 2020-06-06 DIAGNOSIS — I5022 Chronic systolic (congestive) heart failure: Secondary | ICD-10-CM

## 2020-06-06 DIAGNOSIS — E785 Hyperlipidemia, unspecified: Secondary | ICD-10-CM | POA: Diagnosis not present

## 2020-06-06 DIAGNOSIS — I509 Heart failure, unspecified: Secondary | ICD-10-CM

## 2020-06-06 DIAGNOSIS — Z7982 Long term (current) use of aspirin: Secondary | ICD-10-CM | POA: Insufficient documentation

## 2020-06-06 DIAGNOSIS — I471 Supraventricular tachycardia: Secondary | ICD-10-CM | POA: Diagnosis not present

## 2020-06-06 DIAGNOSIS — Z86718 Personal history of other venous thrombosis and embolism: Secondary | ICD-10-CM | POA: Insufficient documentation

## 2020-06-06 LAB — BASIC METABOLIC PANEL
Anion gap: 8 (ref 5–15)
BUN: 18 mg/dL (ref 8–23)
CO2: 21 mmol/L — ABNORMAL LOW (ref 22–32)
Calcium: 9 mg/dL (ref 8.9–10.3)
Chloride: 113 mmol/L — ABNORMAL HIGH (ref 98–111)
Creatinine, Ser: 1.33 mg/dL — ABNORMAL HIGH (ref 0.44–1.00)
GFR calc Af Amer: 46 mL/min — ABNORMAL LOW (ref >60)
GFR calc non Af Amer: 39 mL/min — ABNORMAL LOW (ref >60)
Glucose, Bld: 105 mg/dL — ABNORMAL HIGH (ref 70–99)
Potassium: 4.3 mmol/L (ref 3.5–5.1)
Sodium: 142 mmol/L (ref 135–145)

## 2020-06-06 MED ORDER — ENTRESTO 49-51 MG PO TABS: 1.0000 | ORAL_TABLET | Freq: Two times a day (BID) | ORAL | 6 refills | Status: DC

## 2020-06-06 NOTE — Patient Instructions (Signed)
INCREASE Entresto 49/51mg  Twice Daily  Labs done today, your results will be available in MyChart, we will contact you for abnormal readings.  We have sent you with a prescription to repeat labs in 10-14 days locally  Please call our office in January to schedule a follow up appointment.  If you have any questions or concerns before your next appointment please send [place] a message through [place] or call our office at [phone].    TO LEAVE A MESSAGE FOR THE NURSE SELECT OPTION 2, PLEASE LEAVE A MESSAGE INCLUDING: . YOUR NAME . DATE OF BIRTH . CALL BACK NUMBER . REASON FOR CALL**this is important as we prioritize the call backs  [place] AS LONG AS YOU CALL BEFORE 4:00 PM  At the [facility], you and your health needs are our priority. As part of our continuing mission to provide you with exceptional heart care, we have created designated Provider Care Teams. These Care Teams include your primary Cardiologist (physician) and Advanced Practice Providers (APPs- Physician Assistants and Nurse Practitioners) who all work together to provide you with the care you need, when you need it.   You may see any of the following providers on your designated Care Team at your next follow up: Marland Kitchen Dr Glori Bickers . Dr Loralie Champagne . Darrick Grinder, NP . Lyda Jester, PA . Audry Riles, PharmD   Please be sure to bring in all your medications bottles to every appointment.

## 2020-06-07 NOTE — Progress Notes (Signed)
PCP: Dr. Moshe Cipro Cardiology: Dr. Rayann Heman HF Cardiology: Dr. Aundra Dubin  74 y.o. returns for followup of CHF.   Dr. Rayann Heman has followed her for a short RP SVT.  This has been quiescent recently on Toprol XL.  No palpitations.   Patient was initially found to have low EF in 2014. Repeat echo in 6/17 showed EF 35-40%. Cardiolite in 8/17 showed fixed apical defect but no evidence for ischemia. She was admitted in 1/18 with PNA, DVT, and CHF exacerbation.  Echo at that time showed EF down to 20-25%.  Troponin was mildly elevated to around 0.6. She was diuresed and discharged.   Patient was diagnosed with breast cancer in 2008 and had lumpectomy, radiation, and chemotherapy which included epirubicin, and anthracycline.  Recurrent cancer in 2019 with right mastectomy and chemo with cyclophosphamide, MTX, and fluorouracil.   She had RHC/LHC in 10/18 showing no significant CAD and mildly elevate PCWP.  Cardiac MRI in 11/18 showed EF 49%, no LGE.   Echo in 5/21 showed EF 45%, global hypokinesis, mildly decreased RV systolic function.   She has been stable symptomatically.  She is short of breath after walking up 2 flights of stairs.  No dyspnea walking on flat ground.  She is short of breath walking up hills.  No chest pain.  No lightheadedness.  No palpitations. Weight stable.   Labs (7/18): K 4.3, creatinine 1.16, TSH normal, LDL 101 Labs (8/18): K 5.3, creatinine 1.45 Labs (10/18): K 4.3, creatinine 1.26 Labs (2/21): K 3.6, creatinine 1.45, BNP 239 Labs (7/21): K 4.9, creatinine 1.47, LDL 119  PMH: 1. 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  2. DVT in 1/18 in setting of PNA.  She was on Xarelto for about 6 months.  3. Breast cancer: Diagnosed on right in 2008.  She had lumpectomy, radiation, and chemotherapy.  She had fluorouracil, epirubicin, Cytoxan.  - Recent mammogram with right breast calcifications => biopsy with complex sclerosing lesion.  - Recurrent cancer 2019, she had right mastectomy  and chemotherapy with cyclophosphamide, MTX, fluorouracil.  4. GERD 5. Hyperlipidemia 6. Chronic systolic CHF: Uncertain cause of cardiomyopathy.  Echo in 2014 showed EF 40%.  It is possible that it is related to epirubicin use (anthracycline).  - Echo (6/17): EF 35-40%, mild LV dilation.  - Cardiolite (8/17): no ischemia, apical lateral/apical fixed perfusion defect.  - Echo (1/18): EF 20-25%, moderate MR, severe TR, D-shaped IV septum, PASP 35 mmHg.  - RHC/LHC (10/18): No angiographic CAD.  Mean RA 5, PA 44/11 mean 26, mean PCWP 18, CI 3.49.  - Cardiac MRI (11/18): EF 49%, diffuse mild hypokinesis, mild RV dilation with normal function, no LGE.  - Echo (5/21): EF 45%, global hypokinesis, mildly decreased RV systolic function.   FH: Colon cancer.  No known heart disease.   SH: Married, never smoked.  Lives in [place].  Retired Marine scientist.    ROS: All systems reviewed and negative except as per HPI.   Current Outpatient Medications  Medication Sig Dispense Refill  . acetaminophen (TYLENOL) 500 MG tablet Take 500-1,000 mg by mouth every 6 (six) hours as needed (FOR PAIN.).    Marland Kitchen aspirin EC 81 MG tablet Take 81 mg by mouth daily.     Marland Kitchen azelastine (ASTELIN) 0.1 % nasal spray Place 2 sprays into both nostrils 2 (two) times daily. Use in each nostril as directed    . clonazePAM (KLONOPIN) 0.5 MG tablet Take 1 tablet (0.5 mg total) by mouth at bedtime. 90 tablet  1  . docusate sodium (COLACE) 100 MG capsule Take 200 mg by mouth 2 (two) times daily.     Marland Kitchen lovastatin (MEVACOR) 40 MG tablet TAKE 1 TABLET BY MOUTH AT BEDTIME 90 tablet 2  . metoCLOPramide (REGLAN) 10 MG tablet TAKE ONE TABLET BY MOUTH TWICE DAILY. 180 tablet 1  . metoprolol succinate (TOPROL-XL) 50 MG 24 hr tablet TAKE 1 TABLET BY MOUTH TWICE DAILY. 60 tablet 2  . montelukast (SINGULAIR) 10 MG tablet TAKE ONE TABLET BY MOUTH AT BEDTIME. 30 tablet 0  . pantoprazole (PROTONIX) 40 MG tablet TAKE (1) TABLET BY MOUTH TWICE DAILY. 180 tablet  1  . sertraline (ZOLOFT) 25 MG tablet TAKE 1 TABLET BY MOUTH ONCE A DAY. 30 tablet 5  . spironolactone (ALDACTONE) 25 MG tablet Take 1 tablet (25 mg total) by mouth daily. 90 tablet 3  . sacubitril-valsartan (ENTRESTO) 49-51 MG Take 1 tablet by mouth 2 (two) times daily. 60 tablet 6   No current facility-administered medications for this encounter.   BP (!) 135/95   Pulse 60   Wt 88.6 kg (195 lb 6.4 oz)   SpO2 94%   BMI 42.28 kg/m  General: NAD Neck: No JVD, no thyromegaly or thyroid nodule.  Lungs: Clear to auscultation bilaterally with normal respiratory effort. CV: Nondisplaced PMI.  Heart regular S1/S2, no S3/S4, no murmur.  No peripheral edema.  No carotid bruit.  Normal pedal pulses.  Abdomen: Soft, nontender, no hepatosplenomegaly, no distention.  Skin: Intact without lesions or rashes.  Neurologic: Alert and oriented x 3.  Psych: Normal affect. Extremities: No clubbing or cyanosis.  HEENT: Normal.   Assessment/Plan: 1. Chronic systolic CHF: Long-standing cardiomyopathy, since at least 2014.  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  Could be due to epirubicin exposure with 2008 breast cancer treatment.  Cardiac MRI in 11/18 showed EF up to 49% with no LGE.  Echo in 5/21 showed that EF remains in the 45% range.  Euvolemic on exam today, NYHA class II symptoms.   - Continue spironolactone 25 mg daily.   - Continue Toprol XL 50 mg bid.  - Increase Entresto to 49/51 bid with BMET today and again in 10 days.  - She does not need a diuretic.  - EF out of ICD range.   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  Continue Toprol XL.   Followup in 4 months.    Loralie Champagne 06/07/2020

## 2020-06-13 ENCOUNTER — Other Ambulatory Visit: Payer: Self-pay

## 2020-06-13 MED ORDER — MONTELUKAST SODIUM 10 MG PO TABS: 10.0000 mg | ORAL_TABLET | Freq: Every day | ORAL | 1 refills | Status: DC

## 2020-06-18 ENCOUNTER — Telehealth (HOSPITAL_COMMUNITY): Payer: Self-pay | Admitting: *Deleted

## 2020-06-18 NOTE — Telephone Encounter (Signed)
Pt called requesting a handicap parking card, she states she forgot to get while she was here last week. Form completed, signed by Dr Aundra Dubin, and mailed to pt

## 2020-06-29 ENCOUNTER — Other Ambulatory Visit (HOSPITAL_COMMUNITY): Payer: Self-pay | Admitting: Cardiology

## 2020-07-09 ENCOUNTER — Other Ambulatory Visit: Payer: Self-pay | Admitting: Family Medicine

## 2020-08-06 ENCOUNTER — Other Ambulatory Visit (HOSPITAL_COMMUNITY): Payer: Self-pay | Admitting: Cardiology

## 2020-08-06 ENCOUNTER — Other Ambulatory Visit: Payer: Self-pay | Admitting: Family Medicine

## 2020-08-07 ENCOUNTER — Ambulatory Visit (INDEPENDENT_AMBULATORY_CARE_PROVIDER_SITE_OTHER): Payer: Medicare Other | Admitting: Family Medicine

## 2020-08-07 ENCOUNTER — Other Ambulatory Visit: Payer: Self-pay

## 2020-08-07 ENCOUNTER — Encounter: Payer: Self-pay | Admitting: Family Medicine

## 2020-08-07 VITALS — BP 138/84 | HR 61 | Resp 16 | Ht <= 58 in | Wt 199.0 lb

## 2020-08-07 DIAGNOSIS — Z800 Family history of malignant neoplasm of digestive organs: Secondary | ICD-10-CM

## 2020-08-07 DIAGNOSIS — Z23 Encounter for immunization: Secondary | ICD-10-CM | POA: Diagnosis not present

## 2020-08-07 DIAGNOSIS — I10 Essential (primary) hypertension: Secondary | ICD-10-CM

## 2020-08-07 DIAGNOSIS — Z0000 Encounter for general adult medical examination without abnormal findings: Secondary | ICD-10-CM

## 2020-08-07 DIAGNOSIS — D126 Benign neoplasm of colon, unspecified: Secondary | ICD-10-CM

## 2020-08-07 DIAGNOSIS — D539 Nutritional anemia, unspecified: Secondary | ICD-10-CM

## 2020-08-07 NOTE — Progress Notes (Signed)
    Tara [name]     MRN: [id number]      DOB: [birth year]  HPI: Patient is in for annual physical exam. No other health concerns are expressed or addressed at the visit. Recent labs, are reviewed. Immunization is reviewed , and  updated if needed.   PE: BP 138/84   Pulse 61   Resp 16   Ht 4\' 9"  (1.448 m)   Wt 199 lb (90.3 kg)   SpO2 93%   BMI 43.06 kg/m   Pleasant  female, alert and oriented x 3, in no cardio-pulmonary distress. Afebrile. HEENT No facial trauma or asymetry. Sinuses non tender.  Extra occullar muscles intact.. External ears normal, . Neck: supple, no adenopathy,JVD or thyromegaly.No bruits.  Chest: Clear to ascultation bilaterally.No crackles or wheezes. Non tender to palpation  Breast: Normal mammogram 03/2020  Cardiovascular system; Heart sounds normal,  S1 and  S2 ,no S3.  No murmur, or thrill. Apical beat not displaced Peripheral pulses normal.  Abdomen: Soft, non tender, No guarding, tenderness or rebound.      Musculoskeletal exam: Full ROM of spine, hips , shoulders and knees. No deformity ,swelling or crepitus noted. No muscle wasting or atrophy.   Neurologic: Cranial nerves 2 to 12 intact. Power, tone ,sensation and reflexes normal throughout. No disturbance in gait. No tremor.  Skin: Intact, no ulceration, erythema , scaling or rash noted. Pigmentation normal throughout  Psych; Normal mood and affect. Judgement and concentration normal   Assessment & Plan:  Encounter for annual physical exam Annual exam as documented. Counseling done  re healthy lifestyle involving commitment to 150 minutes exercise per week, heart healthy diet, and attaining healthy weight.The importance of adequate sleep also discussed. Regular seat belt use and home safety, is also discussed. Changes in health habits are decided on by the patient with goals and time frames  set for achieving them. Immunization and cancer screening needs are  specifically addressed at this visit.   Morbid obesity ([place])  Patient re-educated about  the importance of commitment to a  minimum of 150 minutes of exercise per week as able.  The importance of healthy food choices with portion control discussed, as well as eating regularly and within a 12 hour window most days. The need to choose "clean , green" food 50 to 75% of the time is discussed, as well as to make water the primary drink and set a goal of 64 ounces water daily.    Weight /BMI 08/07/2020 06/06/2020 04/23/2020  WEIGHT 199 lb 195 lb 6.4 oz 196 lb  HEIGHT 4\' 9"  - 4\' 9"   BMI 43.06 kg/m2 42.28 kg/m2 42.41 kg/m2

## 2020-08-07 NOTE — Patient Instructions (Addendum)
F/U in office with MD in 4.5 months, call if you need me sooner  Please get fasting lipid, cmp and EGFR,  and iron level today, let lab know she is a difficult stick please  Very important to get your colonoscopy , this is overdue, I have referred you to office in [place]   Please work on food and drink  Choices to help your body, we are ALL in this together  Thanks for choosing [facility], we consider it a privelige to serve you.       Exercises To Do While Sitting  Exercises that you do while sitting (chair exercises) can give you many of the same benefits as full exercise. Benefits include strengthening your heart, burning calories, and keeping muscles and joints healthy. Exercise can also improve your mood and help with depression and anxiety. You may benefit from chair exercises if you are unable to do standing exercises because of:  Diabetic foot pain.  Obesity.  Illness.  Arthritis.  Recovery from surgery or injury.  Breathing problems.  Balance problems.  Another type of disability. Before starting chair exercises, check with your health care provider or a physical therapist to find out how much exercise you can tolerate and which exercises are safe for you. If your health care provider approves:  Start out slowly and build up over time. Aim to work up to about 10-20 minutes for each exercise session.  Make exercise part of your daily routine.  Drink water when you exercise. Do not wait until you are thirsty. Drink every 10-15 minutes.  Stop exercising right away if you have pain, nausea, shortness of breath, or dizziness.  If you are exercising in a wheelchair, make sure to lock the wheels.  Ask your health care provider whether you can do tai chi or yoga. Many positions in these mind-body exercises can be modified to do while seated. Warm-up Before starting other exercises: 1. Sit up as straight as you can. Have your knees bent at 90  degrees, which is the shape of the capital letter "L." Keep your feet flat on the floor. 2. Sit at the front edge of your chair, if you can. 3. Pull in (tighten) the muscles in your abdomen and stretch your spine and neck as straight as you can. Hold this position for a few minutes. 4. Breathe in and out evenly. Try to concentrate on your breathing, and relax your mind. Stretching Exercise A: Arm stretch 1. Hold your arms out straight in front of your body. 2. Bend your hands at the wrist with your fingers pointing up, as if signaling someone to stop. Notice the slight tension in your forearms as you hold the position. 3. Keeping your arms out and your hands bent, rotate your hands outward as far as you can and hold this stretch. Aim to have your thumbs pointing up and your pinkie fingers pointing down. Slowly repeat arm stretches for one minute as tolerated. Exercise B: Leg stretch 1. If you can move your legs, try to "draw" letters on the floor with the toes of your foot. Write your name with one foot. 2. Write your name with the toes of your other foot. Slowly repeat the movements for one minute as tolerated. Exercise C: Reach for the sky 1. Reach your hands as far over your head as you can to stretch your spine. 2. Move your hands and arms as if you are climbing a rope. Slowly repeat the movements for one  minute as tolerated. Range of motion exercises Exercise A: Shoulder roll 1. Let your arms hang loosely at your sides. 2. Lift just your shoulders up toward your ears, then let them relax back down. 3. When your shoulders feel loose, rotate your shoulders in backward and forward circles. Do shoulder rolls slowly for one minute as tolerated. Exercise B: March in place 1. As if you are marching, pump your arms and lift your legs up and down. Lift your knees as high as you can. ? If you are unable to lift your knees, just pump your arms and move your ankles and feet up and down. March in  place for one minute as tolerated. Exercise C: Seated jumping jacks 1. Let your arms hang down straight. 2. Keeping your arms straight, lift them up over your head. Aim to point your fingers to the ceiling. 3. While you lift your arms, straighten your legs and slide your heels along the floor to your sides, as wide as you can. 4. As you bring your arms back down to your sides, slide your legs back together. ? If you are unable to use your legs, just move your arms. Slowly repeat seated jumping jacks for one minute as tolerated. Strengthening exercises Exercise A: Shoulder squeeze 1. Hold your arms straight out from your body to your sides, with your elbows bent and your fists pointed at the ceiling. 2. Keeping your arms in the bent position, move them forward so your elbows and forearms meet in front of your face. 3. Open your arms back out as wide as you can with your elbows still bent, until you feel your shoulder blades squeezing together. Hold for 5 seconds. Slowly repeat the movements forward and backward for one minute as tolerated. Contact a health care provider if you:  Had to stop exercising due to any of the following: ? Pain. ? Nausea. ? Shortness of breath. ? Dizziness. ? Fatigue.  Have significant pain or soreness after exercising. Get help right away if you have:  Chest pain.  Difficulty breathing. These symptoms may represent a serious problem that is an emergency. Do not wait to see if the symptoms will go away. Get medical help right away. Call your local emergency services (911 in the [place] Do not drive yourself to the hospital. This information is not intended to replace advice given to you by your health care provider. Make sure you discuss any questions you have with your health care provider. Document Revised: 12/30/2018 Document Reviewed: 07/22/2017 Elsevier Patient Education  2020 [employer].

## 2020-08-08 ENCOUNTER — Ambulatory Visit: Payer: Medicare Other | Admitting: Gastroenterology

## 2020-08-08 LAB — CMP14+EGFR
ALT: 6 IU/L (ref 0–32)
AST: 9 IU/L (ref 0–40)
Albumin/Globulin Ratio: 1.3 (ref 1.2–2.2)
Albumin: 3.9 g/dL (ref 3.7–4.7)
Alkaline Phosphatase: 97 IU/L (ref 44–121)
BUN/Creatinine Ratio: 13 (ref 12–28)
BUN: 18 mg/dL (ref 8–27)
Bilirubin Total: <0.2 mg/dL (ref 0.0–1.2)
CO2: 19 mmol/L — ABNORMAL LOW (ref 20–29)
Calcium: 8.3 mg/dL — ABNORMAL LOW (ref 8.7–10.3)
Chloride: 108 mmol/L — ABNORMAL HIGH (ref 96–106)
Creatinine, Ser: 1.38 mg/dL — ABNORMAL HIGH (ref 0.57–1.00)
GFR calc Af Amer: 43 mL/min/{1.73_m2} — ABNORMAL LOW (ref >59)
GFR calc non Af Amer: 38 mL/min/{1.73_m2} — ABNORMAL LOW (ref >59)
Globulin, Total: 2.9 g/dL (ref 1.5–4.5)
Glucose: 100 mg/dL — ABNORMAL HIGH (ref 65–99)
Potassium: 4 mmol/L (ref 3.5–5.2)
Sodium: 145 mmol/L — ABNORMAL HIGH (ref 134–144)
Total Protein: 6.8 g/dL (ref 6.0–8.5)

## 2020-08-08 LAB — LIPID PANEL
Chol/HDL Ratio: 3 ratio (ref 0.0–4.4)
Cholesterol, Total: 155 mg/dL (ref 100–199)
HDL: 51 mg/dL (ref >39)
LDL Chol Calc (NIH): 89 mg/dL (ref 0–99)
Triglycerides: 80 mg/dL (ref 0–149)
VLDL Cholesterol Cal: 15 mg/dL (ref 5–40)

## 2020-08-08 LAB — IRON: Iron: 41 ug/dL (ref 27–139)

## 2020-08-09 ENCOUNTER — Encounter: Payer: Self-pay | Admitting: Family Medicine

## 2020-08-09 NOTE — Assessment & Plan Note (Signed)
  Patient re-educated about  the importance of commitment to a  minimum of 150 minutes of exercise per week as able.  The importance of healthy food choices with portion control discussed, as well as eating regularly and within a 12 hour window most days. The need to choose "clean , green" food 50 to 75% of the time is discussed, as well as to make water the primary drink and set a goal of 64 ounces water daily.    Weight /BMI 08/07/2020 06/06/2020 04/23/2020  WEIGHT 199 lb 195 lb 6.4 oz 196 lb  HEIGHT 4\' 9"  - 4\' 9"   BMI 43.06 kg/m2 42.28 kg/m2 42.41 kg/m2

## 2020-08-09 NOTE — Assessment & Plan Note (Signed)

## 2020-08-13 ENCOUNTER — Encounter: Payer: Medicare Other | Admitting: Family Medicine

## 2020-08-21 ENCOUNTER — Other Ambulatory Visit: Payer: Self-pay | Admitting: Family Medicine

## 2020-09-04 ENCOUNTER — Other Ambulatory Visit: Payer: Self-pay | Admitting: Family Medicine

## 2020-09-04 ENCOUNTER — Other Ambulatory Visit (HOSPITAL_COMMUNITY): Payer: Self-pay | Admitting: Cardiology

## 2020-09-11 ENCOUNTER — Other Ambulatory Visit: Payer: Self-pay | Admitting: Family Medicine

## 2020-09-12 ENCOUNTER — Encounter: Payer: Self-pay | Admitting: Family Medicine

## 2020-09-12 DIAGNOSIS — M21961 Unspecified acquired deformity of right lower leg: Secondary | ICD-10-CM | POA: Insufficient documentation

## 2020-09-13 ENCOUNTER — Ambulatory Visit: Payer: Medicare Other | Attending: Internal Medicine

## 2020-09-13 DIAGNOSIS — Z23 Encounter for immunization: Secondary | ICD-10-CM

## 2020-09-13 NOTE — Progress Notes (Signed)
   Covid-19 Vaccination Clinic  Name:  Tara [name]    MRN: [id number] DOB: 28-Aug-1946  09/13/2020  Ms. [name] was observed post Covid-19 immunization for 15 minutes without incident. She was provided with Vaccine Information Sheet and instruction to access the V-Safe system.   Ms. [name] was instructed to call 911 with any severe reactions post vaccine: Marland Kitchen Difficulty breathing  . Swelling of face and throat  . A fast heartbeat  . A bad rash all over body  . Dizziness and weakness   Immunizations Administered    Name Date Dose VIS Date Route   Pfizer COVID-19 Vaccine 09/13/2020  2:46 PM 0.3 mL 07/11/2020 Intramuscular   Manufacturer: [place]   Lot: [id number]   NDC: [address]-4

## 2020-10-01 ENCOUNTER — Other Ambulatory Visit: Payer: Self-pay | Admitting: Family Medicine

## 2020-10-04 ENCOUNTER — Other Ambulatory Visit: Payer: Self-pay | Admitting: Family Medicine

## 2020-10-04 ENCOUNTER — Other Ambulatory Visit (HOSPITAL_COMMUNITY): Payer: Self-pay | Admitting: Cardiology

## 2020-10-16 ENCOUNTER — Other Ambulatory Visit: Payer: Self-pay | Admitting: Physician Assistant

## 2020-11-05 ENCOUNTER — Other Ambulatory Visit (HOSPITAL_COMMUNITY): Payer: Self-pay | Admitting: Cardiology

## 2020-11-05 ENCOUNTER — Other Ambulatory Visit: Payer: Self-pay | Admitting: Family Medicine

## 2020-11-19 ENCOUNTER — Other Ambulatory Visit: Payer: Self-pay | Admitting: Family Medicine

## 2020-12-04 ENCOUNTER — Other Ambulatory Visit (HOSPITAL_COMMUNITY): Payer: Self-pay | Admitting: Cardiology

## 2020-12-04 ENCOUNTER — Other Ambulatory Visit: Payer: Self-pay | Admitting: Family Medicine

## 2020-12-12 ENCOUNTER — Ambulatory Visit: Payer: Medicare Other | Admitting: Family Medicine

## 2020-12-14 ENCOUNTER — Encounter (HOSPITAL_COMMUNITY): Payer: Self-pay | Admitting: Cardiology

## 2020-12-14 ENCOUNTER — Other Ambulatory Visit: Payer: Self-pay

## 2020-12-14 ENCOUNTER — Ambulatory Visit (HOSPITAL_COMMUNITY)
Admission: RE | Admit: 2020-12-14 | Discharge: 2020-12-14 | Disposition: A | Discharge status: Home / Self Care | Payer: Medicare Other | Source: Ambulatory Visit | Attending: Cardiology | Admitting: Cardiology

## 2020-12-14 VITALS — BP 104/60 | HR 68 | Wt 191.4 lb

## 2020-12-14 DIAGNOSIS — I471 Supraventricular tachycardia: Secondary | ICD-10-CM | POA: Diagnosis not present

## 2020-12-14 DIAGNOSIS — Z7982 Long term (current) use of aspirin: Secondary | ICD-10-CM | POA: Diagnosis not present

## 2020-12-14 DIAGNOSIS — R0602 Shortness of breath: Secondary | ICD-10-CM | POA: Diagnosis not present

## 2020-12-14 DIAGNOSIS — Z79899 Other long term (current) drug therapy: Secondary | ICD-10-CM | POA: Insufficient documentation

## 2020-12-14 DIAGNOSIS — I5022 Chronic systolic (congestive) heart failure: Secondary | ICD-10-CM | POA: Diagnosis not present

## 2020-12-14 DIAGNOSIS — I428 Other cardiomyopathies: Secondary | ICD-10-CM | POA: Diagnosis not present

## 2020-12-14 DIAGNOSIS — Z7984 Long term (current) use of oral hypoglycemic drugs: Secondary | ICD-10-CM | POA: Insufficient documentation

## 2020-12-14 LAB — BASIC METABOLIC PANEL
Anion gap: 6 (ref 5–15)
BUN: 19 mg/dL (ref 8–23)
CO2: 22 mmol/L (ref 22–32)
Calcium: 8.4 mg/dL — ABNORMAL LOW (ref 8.9–10.3)
Chloride: 113 mmol/L — ABNORMAL HIGH (ref 98–111)
Creatinine, Ser: 1.39 mg/dL — ABNORMAL HIGH (ref 0.44–1.00)
GFR, Estimated: 40 mL/min — ABNORMAL LOW (ref >60)
Glucose, Bld: 112 mg/dL — ABNORMAL HIGH (ref 70–99)
Potassium: 3.7 mmol/L (ref 3.5–5.1)
Sodium: 141 mmol/L (ref 135–145)

## 2020-12-14 MED ORDER — DAPAGLIFLOZIN PROPANEDIOL 10 MG PO TABS
10.0000 mg | ORAL_TABLET | Freq: Every day | ORAL | 6 refills | Status: DC
Start: 2020-12-14 — End: 2020-12-18

## 2020-12-14 NOTE — Patient Instructions (Signed)
Start Farxiga 10 mg Daily  Your provider has prescribed Wilder Glade for you. Please be aware the most common side effect of this medication is urinary tract infections and yeast infections. Please practice good hygiene and keep this area clean and dry to help prevent this. If you do begin to have symptoms of these infections, such as difficulty urinating or painful urination,  please let us know.  Labs done today, your results will be available in MyChart, we will contact you for abnormal readings.  Your physician recommends that you return for lab work in: 1-2 weeks, we have provided you a prescription to have this done locally  Please call our office in June to schedule your echocardiogram and follow up appointment  If you have any questions or concerns before your next appointment please send [place] a message through [place] or call our office at [phone].    TO LEAVE A MESSAGE FOR THE NURSE SELECT OPTION 2, PLEASE LEAVE A MESSAGE INCLUDING: . YOUR NAME . DATE OF BIRTH . CALL BACK NUMBER . REASON FOR CALL**this is important as we prioritize the call backs  [place] AS LONG AS YOU CALL BEFORE 4:00 PM  At the [facility], you and your health needs are our priority. As part of our continuing mission to provide you with exceptional heart care, we have created designated Provider Care Teams. These Care Teams include your primary Cardiologist (physician) and Advanced Practice Providers (APPs- Physician Assistants and Nurse Practitioners) who all work together to provide you with the care you need, when you need it.   You may see any of the following providers on your designated Care Team at your next follow up: Marland Kitchen Dr Glori Bickers . Dr Loralie Champagne . Dr Vickki Muff . Darrick Grinder, NP . Lyda Jester, [place] . Audry Riles, PharmD   Please be sure to bring in all your medications bottles to every appointment.

## 2020-12-14 NOTE — Progress Notes (Signed)
Pt given 30 day free card for [place]

## 2020-12-16 NOTE — Progress Notes (Signed)
PCP: Dr. Moshe Cipro Cardiology: Dr. Rayann Heman HF Cardiology: Dr. Aundra Dubin  75 y.o. returns for followup of CHF.   Dr. Rayann Heman has followed her for a short RP SVT.  This has been quiescent recently on Toprol XL.  No palpitations.   Patient was initially found to have low EF in 2014. Repeat echo in 6/17 showed EF 35-40%. Cardiolite in 8/17 showed fixed apical defect but no evidence for ischemia. She was admitted in 1/18 with PNA, DVT, and CHF exacerbation.  Echo at that time showed EF down to 20-25%.  Troponin was mildly elevated to around 0.6. She was diuresed and discharged.   Patient was diagnosed with breast cancer in 2008 and had lumpectomy, radiation, and chemotherapy which included epirubicin, and anthracycline.  Recurrent cancer in 2019 with right mastectomy and chemo with cyclophosphamide, MTX, and fluorouracil.   She had RHC/LHC in 10/18 showing no significant CAD and mildly elevate PCWP.  Cardiac MRI in 11/18 showed EF 49%, no LGE.   Echo in 5/21 showed EF 45%, global hypokinesis, mildly decreased RV systolic function.   She returns for followup of CHF.  Weight down 4 lbs.  She is short of breath walking up stairs or walking about 1 block on flat ground.  No chest pain.  No palpitations. No lightheadedness.    Labs (7/18): K 4.3, creatinine 1.16, TSH normal, LDL 101 Labs (8/18): K 5.3, creatinine 1.45 Labs (10/18): K 4.3, creatinine 1.26 Labs (2/21): K 3.6, creatinine 1.45, BNP 239 Labs (7/21): K 4.9, creatinine 1.47, LDL 119 Labs (11/21): K 4, creatinine 1.38, LDL 89  PMH: 1. 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  2. DVT in 1/18 in setting of PNA.  She was on Xarelto for about 6 months.  3. Breast cancer: Diagnosed on right in 2008.  She had lumpectomy, radiation, and chemotherapy.  She had fluorouracil, epirubicin, Cytoxan.  - Recent mammogram with right breast calcifications => biopsy with complex sclerosing lesion.  - Recurrent cancer 2019, she had right mastectomy and  chemotherapy with cyclophosphamide, MTX, fluorouracil.  4. GERD 5. Hyperlipidemia 6. Chronic systolic CHF: Uncertain cause of cardiomyopathy.  Echo in 2014 showed EF 40%.  It is possible that it is related to epirubicin use (anthracycline).  - Echo (6/17): EF 35-40%, mild LV dilation.  - Cardiolite (8/17): no ischemia, apical lateral/apical fixed perfusion defect.  - Echo (1/18): EF 20-25%, moderate MR, severe TR, D-shaped IV septum, PASP 35 mmHg.  - RHC/LHC (10/18): No angiographic CAD.  Mean RA 5, PA 44/11 mean 26, mean PCWP 18, CI 3.49.  - Cardiac MRI (11/18): EF 49%, diffuse mild hypokinesis, mild RV dilation with normal function, no LGE.  - Echo (5/21): EF 45%, global hypokinesis, mildly decreased RV systolic function.   FH: Colon cancer.  No known heart disease.   SH: Married, never smoked.  Lives in [place].  Retired Marine scientist.    ROS: All systems reviewed and negative except as per HPI.   Current Outpatient Medications  Medication Sig Dispense Refill  . acetaminophen (TYLENOL) 500 MG tablet Take 500-1,000 mg by mouth every 6 (six) hours as needed (FOR PAIN.).    Marland Kitchen aspirin EC 81 MG tablet Take 81 mg by mouth daily.     Marland Kitchen azelastine (ASTELIN) 0.1 % nasal spray Place 2 sprays into both nostrils 2 (two) times daily. Use in each nostril as directed    . clonazePAM (KLONOPIN) 0.5 MG tablet Take 1 tablet (0.5 mg total) by mouth at bedtime. 90 tablet  1  . dapagliflozin propanediol (FARXIGA) 10 MG TABS tablet Take 1 tablet (10 mg total) by mouth daily before breakfast. 30 tablet 6  . docusate sodium (COLACE) 100 MG capsule Take 200 mg by mouth 2 (two) times daily.     Marland Kitchen ENTRESTO 49-51 MG TAKE ONE TABLET BY MOUTH 2 TIMES A DAY 180 tablet 3  . lovastatin (MEVACOR) 40 MG tablet TAKE 1 TABLET BY MOUTH AT BEDTIME 90 tablet 2  . metoCLOPramide (REGLAN) 10 MG tablet TAKE ONE TABLET BY MOUTH TWICE DAILY. 180 tablet 0  . metoprolol succinate (TOPROL-XL) 50 MG 24 hr tablet Take 1 tablet (50 mg total)  by mouth 2 (two) times daily. Needs appointment for further refill 120 tablet 0  . montelukast (SINGULAIR) 10 MG tablet TAKE ONE TABLET BY MOUTH AT BEDTIME. 90 tablet 0  . pantoprazole (PROTONIX) 40 MG tablet TAKE (1) TABLET BY MOUTH TWICE DAILY. 180 tablet 0  . sertraline (ZOLOFT) 25 MG tablet TAKE 1 TABLET BY MOUTH ONCE A DAY. 30 tablet 0  . spironolactone (ALDACTONE) 25 MG tablet Take 1 tablet (25 mg total) by mouth daily. Needs appointment for further refills 60 tablet 0   No current facility-administered medications for this encounter.   BP 104/60   Pulse 68   Wt 86.8 kg (191 lb 6.4 oz)   SpO2 93%   BMI 41.42 kg/m  General: NAD Neck: No JVD, no thyromegaly or thyroid nodule.  Lungs: Clear to auscultation bilaterally with normal respiratory effort. CV: Nondisplaced PMI.  Heart regular S1/S2, no S3/S4, no murmur.  No peripheral edema.  No carotid bruit.  Normal pedal pulses.  Abdomen: Soft, nontender, no hepatosplenomegaly, no distention.  Skin: Intact without lesions or rashes.  Neurologic: Alert and oriented x 3.  Psych: Normal affect. Extremities: No clubbing or cyanosis.  HEENT: Normal.   Assessment/Plan: 1. Chronic systolic CHF: Long-standing cardiomyopathy, since at least 2014.  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  Could be due to epirubicin exposure with 2008 breast cancer treatment.  Cardiac MRI in 11/18 showed EF up to 49% with no LGE.  Echo in 5/21 showed that EF remains in the 45% range.  Euvolemic on exam today, NYHA class II symptoms.   - Continue spironolactone 25 mg daily.   - Continue Toprol XL 50 mg bid.  - Continue Entresto 49/51 bid.  - Add Farxiga 10 mg daily. BMET today and in 10 days.  - She does not need a diuretic.  - EF out of ICD range on last echo.  - Repeat echo at followup in 3 months.   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  Continue Toprol XL.   Followup in 3 months with echo.    Tara [name] 12/16/2020

## 2020-12-17 ENCOUNTER — Other Ambulatory Visit: Payer: Self-pay | Admitting: Internal Medicine

## 2020-12-17 ENCOUNTER — Ambulatory Visit: Payer: Medicare Other | Admitting: Family Medicine

## 2020-12-17 ENCOUNTER — Telehealth: Payer: Self-pay

## 2020-12-17 DIAGNOSIS — F411 Generalized anxiety disorder: Secondary | ICD-10-CM

## 2020-12-17 MED ORDER — CLONAZEPAM 0.5 MG PO TABS: 0.5000 mg | ORAL_TABLET | Freq: Every day | ORAL | 1 refills | Status: DC

## 2020-12-17 NOTE — Telephone Encounter (Signed)
Pt requests refill on klonopin

## 2020-12-17 NOTE — Telephone Encounter (Signed)
Sent. Thanks.   

## 2020-12-17 NOTE — Telephone Encounter (Signed)
Patient needing refill on Klonopin 0.5 mg sent to [facility] ph# [phone]

## 2020-12-18 ENCOUNTER — Telehealth (HOSPITAL_COMMUNITY): Payer: Self-pay | Admitting: Pharmacy Technician

## 2020-12-18 ENCOUNTER — Ambulatory Visit: Payer: Medicare Other | Admitting: Family Medicine

## 2020-12-18 MED ORDER — EMPAGLIFLOZIN 10 MG PO TABS: 10.0000 mg | ORAL_TABLET | Freq: Every day | ORAL | 3 refills | Status: DC

## 2020-12-18 NOTE — Telephone Encounter (Addendum)
Patient was seen in clinic last week and started on Farxiga. Her insurance prefers [facility]. 30 day co-pay is $37, 90 days is $111.  Tommas Olp (RN), a message requesting Wilder Glade be d/c and Jardiance sent to the patient's pharmacy.   Called and left the patient message.   Charlann Boxer, CPhT

## 2020-12-18 NOTE — Telephone Encounter (Signed)
New RX sent in for [facility]

## 2020-12-19 ENCOUNTER — Ambulatory Visit: Payer: Medicare Other | Admitting: Family Medicine

## 2020-12-20 ENCOUNTER — Ambulatory Visit: Payer: Medicare Other | Admitting: Family Medicine

## 2020-12-25 ENCOUNTER — Encounter: Payer: Self-pay | Admitting: Family Medicine

## 2020-12-25 ENCOUNTER — Telehealth (INDEPENDENT_AMBULATORY_CARE_PROVIDER_SITE_OTHER): Payer: Medicare Other | Admitting: Family Medicine

## 2020-12-25 ENCOUNTER — Other Ambulatory Visit: Payer: Self-pay

## 2020-12-25 VITALS — BP 130/76 | Ht <= 58 in | Wt 180.0 lb

## 2020-12-25 DIAGNOSIS — N183 Chronic kidney disease, stage 3 unspecified: Secondary | ICD-10-CM

## 2020-12-25 DIAGNOSIS — F5105 Insomnia due to other mental disorder: Secondary | ICD-10-CM

## 2020-12-25 DIAGNOSIS — K649 Unspecified hemorrhoids: Secondary | ICD-10-CM | POA: Diagnosis not present

## 2020-12-25 DIAGNOSIS — I10 Essential (primary) hypertension: Secondary | ICD-10-CM | POA: Diagnosis not present

## 2020-12-25 DIAGNOSIS — E7800 Pure hypercholesterolemia, unspecified: Secondary | ICD-10-CM

## 2020-12-25 DIAGNOSIS — Z1231 Encounter for screening mammogram for malignant neoplasm of breast: Secondary | ICD-10-CM

## 2020-12-25 DIAGNOSIS — F324 Major depressive disorder, single episode, in partial remission: Secondary | ICD-10-CM

## 2020-12-25 DIAGNOSIS — E8881 Metabolic syndrome: Secondary | ICD-10-CM

## 2020-12-25 DIAGNOSIS — E559 Vitamin D deficiency, unspecified: Secondary | ICD-10-CM

## 2020-12-25 DIAGNOSIS — K219 Gastro-esophageal reflux disease without esophagitis: Secondary | ICD-10-CM

## 2020-12-25 DIAGNOSIS — J302 Other seasonal allergic rhinitis: Secondary | ICD-10-CM

## 2020-12-25 MED ORDER — HYDROCORTISONE (PERIANAL) 2.5 % EX CREA: 1.0000 "application " | TOPICAL_CREAM | Freq: Two times a day (BID) | CUTANEOUS | 2 refills | Status: DC

## 2020-12-25 NOTE — Progress Notes (Signed)
Virtual Visit via Telephone Note  I connected with Tara [name] on 12/25/20 at  9:40 AM EDT by telephone and verified that I am speaking with the correct person using two identifiers.  Location: Patient: home Provider: office   I discussed the limitations, risks, security and privacy concerns of performing an evaluation and management service by telephone and the availability of in person appointments. I also discussed with the patient that there may be a patient responsible charge related to this service. The patient expressed understanding and agreed to proceed.   History of Present Illness: F/U chronic problems and address any new or current concerns. Review and update medications and allergies. Review recent lab and radiologic data . Update routine health maintainace. Review an encourage improved health habits to include nutrition, exercise and  sleep . C/o recent hemorrhoid prolapse requests medication  Denies recent fever or chills. Denies sinus pressure, nasal congestion, ear pain or sore throat. Denies chest congestion, productive cough or wheezing. Denies chest pains, palpitations and leg swelling Denies abdominal pain, nausea, vomiting,diarrhea or constipation.   Denies dysuria, frequency, hesitancy or incontinence. Denies joint pain, swelling and limitation in mobility. Denies headaches, seizures, numbness, or tingling. Denies uncontrolled  depression, anxiety or insomnia. Denies skin break down or rash.       Observations/Objective: BP 130/76   Ht 4\' 9"  (1.448 m)   Wt 180 lb (81.6 kg)   BMI 38.95 kg/m  Good communication with no confusion and intact memory. Alert and oriented x 3 No signs of respiratory distress during speech    Assessment and Plan: Hypertension Controlled, no change in medication DASH diet and commitment to daily physical activity for a minimum of 30 minutes discussed and encouraged, as a part of hypertension management. The importance  of attaining a healthy weight is also discussed.  BP/Weight 12/25/2020 12/14/2020 08/07/2020 06/06/2020 04/23/2020 03/22/2020 1/66/0630  Systolic BP 160 109 323 557 322 025 427  Diastolic BP 76 60 84 95 70 64 60  Wt. (Lbs) 180 191.4 199 195.4 196 196 196  BMI 38.95 41.42 43.06 42.28 42.41 42.41 42.41       Hyperlipidemia Hyperlipidemia:Low fat diet discussed and encouraged.   Lipid Panel  Lab Results  Component Value Date   CHOL 155 08/07/2020   HDL 51 08/07/2020   LDLCALC 89 08/07/2020   TRIG 80 08/07/2020   CHOLHDL 3.0 08/07/2020     Controlled, no change in medication Updated lab needed at/ before next visit.   Morbid obesity ([place]) Obesity linked with hypertension and hyperlipidemia and depression Improved  Patient re-educated about  the importance of commitment to a  minimum of 150 minutes of exercise per week as able.  The importance of healthy food choices with portion control discussed, as well as eating regularly and within a 12 hour window most days. The need to choose "clean , green" food 50 to 75% of the time is discussed, as well as to make water the primary drink and set a goal of 64 ounces water daily.    Weight /BMI 12/25/2020 12/14/2020 08/07/2020  WEIGHT 180 lb 191 lb 6.4 oz 199 lb  HEIGHT 4\' 9"  - 4\' 9"   BMI 38.95 kg/m2 41.42 kg/m2 43.06 kg/m2      Seasonal allergies Currently having increased symptoms with season change, adequately controlled on current regime  Depression, major, single episode, in partial remission (HCC) Controlled, no change in medication   GERD Controlled, no change in medication   Insomnia related to another  mental disorder Sleep hygiene reviewed and written information offered also. Prescription sent for  medication needed.   Hemorrhoid Currently symptomatic, hydrocortisone cream prescribed    Follow Up Instructions:    I discussed the assessment and treatment plan with the patient. The patient was provided an  opportunity to ask questions and all were answered. The patient agreed with the plan and demonstrated an understanding of the instructions.   The patient was advised to call back or seek an in-person evaluation if the symptoms worsen or if the condition fails to improve as anticipated.  I provided 15 minutes of non-face-to-face time during this encounter.   [name], MD

## 2020-12-25 NOTE — Patient Instructions (Addendum)
F/u in office early September, flu vaccine at visit, call if you need me sooner  No medication  change  Please get fasting cBC, lipid, cmp and eGFR, 1 WEEK BEFORE next visit  Unilateral left mammogram to be scheduled at checkout at breast center   Medication  Sent for hemorrhoid  It is important that you exercise regularly at least 30 minutes 5 times a week. If you develop chest pain, have severe difficulty breathing, or feel very tired, stop exercising immediately and seek medical attention  Think about what you will eat, plan ahead. Choose " clean, green, fresh or frozen" over canned, processed or packaged foods which are more sugary, salty and fatty. 70 to 75% of food eaten should be vegetables and fruit. Three meals at set times with snacks allowed between meals, but they must be fruit or vegetables. Aim to eat over a 12 hour period , example 7 am to 7 pm, and STOP after  your last meal of the day. Drink water,generally about 64 ounces per day, no other drink is as healthy. Fruit juice is best enjoyed in a healthy way, by EATING the fruit. Thanks for choosing [facility], we consider it a privelige to serve you.

## 2020-12-27 ENCOUNTER — Other Ambulatory Visit: Payer: Self-pay | Admitting: Cardiology

## 2020-12-28 LAB — BASIC METABOLIC PANEL
BUN/Creatinine Ratio: 18 (ref 12–28)
BUN: 24 mg/dL (ref 8–27)
CO2: 17 mmol/L — ABNORMAL LOW (ref 20–29)
Calcium: 9.1 mg/dL (ref 8.7–10.3)
Chloride: 108 mmol/L — ABNORMAL HIGH (ref 96–106)
Creatinine, Ser: 1.36 mg/dL — ABNORMAL HIGH (ref 0.57–1.00)
Glucose: 91 mg/dL (ref 65–99)
Potassium: 4.4 mmol/L (ref 3.5–5.2)
Sodium: 143 mmol/L (ref 134–144)
eGFR: 41 mL/min/{1.73_m2} — ABNORMAL LOW (ref >59)

## 2021-01-02 ENCOUNTER — Other Ambulatory Visit (HOSPITAL_COMMUNITY): Payer: Self-pay | Admitting: Cardiology

## 2021-01-02 ENCOUNTER — Other Ambulatory Visit: Payer: Self-pay | Admitting: Family Medicine

## 2021-01-04 ENCOUNTER — Telehealth (HOSPITAL_COMMUNITY): Payer: Self-pay

## 2021-01-04 ENCOUNTER — Encounter: Payer: Self-pay | Admitting: Family Medicine

## 2021-01-04 DIAGNOSIS — K649 Unspecified hemorrhoids: Secondary | ICD-10-CM | POA: Insufficient documentation

## 2021-01-04 DIAGNOSIS — I5022 Chronic systolic (congestive) heart failure: Secondary | ICD-10-CM

## 2021-01-04 DIAGNOSIS — I509 Heart failure, unspecified: Secondary | ICD-10-CM

## 2021-01-04 NOTE — Assessment & Plan Note (Signed)
Currently symptomatic, hydrocortisone cream prescribed

## 2021-01-04 NOTE — Telephone Encounter (Signed)
Patient advised and verbalized understanding. Lab order,patient will have repeat labs drawn in [place] This Encounter  Procedures  . Basic metabolic panel    Standing Status:   Future    Standing Expiration Date:   01/04/2022    Order Specific Question:   Release to patient    Answer:   Immediate

## 2021-01-04 NOTE — Assessment & Plan Note (Signed)
Sleep hygiene reviewed and written information offered also. Prescription sent for  medication needed.  

## 2021-01-04 NOTE — Assessment & Plan Note (Signed)
Hyperlipidemia:Low fat diet discussed and encouraged.   Lipid Panel  Lab Results  Component Value Date   CHOL 155 08/07/2020   HDL 51 08/07/2020   LDLCALC 89 08/07/2020   TRIG 80 08/07/2020   CHOLHDL 3.0 08/07/2020     Controlled, no change in medication Updated lab needed at/ before next visit.

## 2021-01-04 NOTE — Assessment & Plan Note (Signed)
Currently having increased symptoms with season change, adequately controlled on current regime

## 2021-01-04 NOTE — Telephone Encounter (Signed)
-----   Message from Larey Dresser, MD sent at 01/03/2021  7:48 PM EDT ----- Low CO2, uncertain of cause.  Would repeat BMET.

## 2021-01-04 NOTE — Assessment & Plan Note (Signed)
Controlled, no change in medication  

## 2021-01-04 NOTE — Assessment & Plan Note (Addendum)
Obesity linked with hypertension and hyperlipidemia and depression Improved  Patient re-educated about  the importance of commitment to a  minimum of 150 minutes of exercise per week as able.  The importance of healthy food choices with portion control discussed, as well as eating regularly and within a 12 hour window most days. The need to choose "clean , green" food 50 to 75% of the time is discussed, as well as to make water the primary drink and set a goal of 64 ounces water daily.    Weight /BMI 12/25/2020 12/14/2020 08/07/2020  WEIGHT 180 lb 191 lb 6.4 oz 199 lb  HEIGHT 4\' 9"  - 4\' 9"   BMI 38.95 kg/m2 41.42 kg/m2 43.06 kg/m2

## 2021-01-04 NOTE — Assessment & Plan Note (Signed)
Controlled, no change in medication DASH diet and commitment to daily physical activity for a minimum of 30 minutes discussed and encouraged, as a part of hypertension management. The importance of attaining a healthy weight is also discussed.  BP/Weight 12/25/2020 12/14/2020 08/07/2020 06/06/2020 04/23/2020 03/22/2020 3/83/8184  Systolic BP 037 543 606 770 340 352 481  Diastolic BP 76 60 84 95 70 64 60  Wt. (Lbs) 180 191.4 199 195.4 196 196 196  BMI 38.95 41.42 43.06 42.28 42.41 42.41 42.41

## 2021-01-10 ENCOUNTER — Other Ambulatory Visit (HOSPITAL_COMMUNITY): Payer: Self-pay

## 2021-01-10 DIAGNOSIS — I428 Other cardiomyopathies: Secondary | ICD-10-CM

## 2021-01-10 NOTE — Progress Notes (Signed)
Released order

## 2021-01-11 ENCOUNTER — Telehealth (HOSPITAL_COMMUNITY): Payer: Self-pay | Admitting: Cardiology

## 2021-01-11 LAB — BASIC METABOLIC PANEL
BUN/Creatinine Ratio: 14 (ref 12–28)
BUN: 17 mg/dL (ref 8–27)
CO2: 17 mmol/L — ABNORMAL LOW (ref 20–29)
Calcium: 8.7 mg/dL (ref 8.7–10.3)
Chloride: 107 mmol/L — ABNORMAL HIGH (ref 96–106)
Creatinine, Ser: 1.24 mg/dL — ABNORMAL HIGH (ref 0.57–1.00)
Glucose: 85 mg/dL (ref 65–99)
Potassium: 4.2 mmol/L (ref 3.5–5.2)
Sodium: 140 mmol/L (ref 134–144)
eGFR: 45 mL/min/{1.73_m2} — ABNORMAL LOW (ref >59)

## 2021-01-11 NOTE — Telephone Encounter (Signed)
Pt called for medication consult, please advise

## 2021-01-11 NOTE — Telephone Encounter (Signed)
Pt is running out of farxiga samples and was told she was being switched to jardiance. Script for jardiance was sent to pharmacy 3/29 the patient will contact her pharmacy for refill.

## 2021-01-18 ENCOUNTER — Other Ambulatory Visit (HOSPITAL_COMMUNITY): Payer: Self-pay | Admitting: Cardiology

## 2021-02-04 ENCOUNTER — Other Ambulatory Visit: Payer: Self-pay | Admitting: Family Medicine

## 2021-02-11 ENCOUNTER — Other Ambulatory Visit: Payer: Self-pay | Admitting: Family Medicine

## 2021-03-12 ENCOUNTER — Other Ambulatory Visit: Payer: Self-pay | Admitting: Family Medicine

## 2021-03-12 ENCOUNTER — Other Ambulatory Visit (HOSPITAL_COMMUNITY): Payer: Self-pay | Admitting: Cardiology

## 2021-03-26 ENCOUNTER — Other Ambulatory Visit: Payer: Self-pay | Admitting: Family Medicine

## 2021-04-04 ENCOUNTER — Ambulatory Visit (HOSPITAL_BASED_OUTPATIENT_CLINIC_OR_DEPARTMENT_OTHER)
Admission: RE | Admit: 2021-04-04 | Discharge: 2021-04-04 | Disposition: A | Discharge status: Home / Self Care | Payer: Medicare Other | Source: Ambulatory Visit | Attending: Cardiology | Admitting: Cardiology

## 2021-04-04 ENCOUNTER — Ambulatory Visit (HOSPITAL_COMMUNITY)
Admission: RE | Admit: 2021-04-04 | Discharge: 2021-04-04 | Disposition: A | Discharge status: Home / Self Care | Payer: Medicare Other | Source: Ambulatory Visit | Attending: Cardiology | Admitting: Cardiology

## 2021-04-04 ENCOUNTER — Encounter (HOSPITAL_COMMUNITY): Payer: Self-pay | Admitting: Cardiology

## 2021-04-04 ENCOUNTER — Other Ambulatory Visit: Payer: Self-pay

## 2021-04-04 VITALS — BP 106/60 | HR 59 | Wt 186.6 lb

## 2021-04-04 DIAGNOSIS — E785 Hyperlipidemia, unspecified: Secondary | ICD-10-CM | POA: Insufficient documentation

## 2021-04-04 DIAGNOSIS — I509 Heart failure, unspecified: Secondary | ICD-10-CM | POA: Diagnosis not present

## 2021-04-04 DIAGNOSIS — K219 Gastro-esophageal reflux disease without esophagitis: Secondary | ICD-10-CM | POA: Insufficient documentation

## 2021-04-04 DIAGNOSIS — I110 Hypertensive heart disease with heart failure: Secondary | ICD-10-CM | POA: Insufficient documentation

## 2021-04-04 DIAGNOSIS — I428 Other cardiomyopathies: Secondary | ICD-10-CM | POA: Diagnosis not present

## 2021-04-04 LAB — BASIC METABOLIC PANEL
Anion gap: 7 (ref 5–15)
BUN: 27 mg/dL — ABNORMAL HIGH (ref 8–23)
CO2: 17 mmol/L — ABNORMAL LOW (ref 22–32)
Calcium: 8.5 mg/dL — ABNORMAL LOW (ref 8.9–10.3)
Chloride: 115 mmol/L — ABNORMAL HIGH (ref 98–111)
Creatinine, Ser: 1.66 mg/dL — ABNORMAL HIGH (ref 0.44–1.00)
GFR, Estimated: 32 mL/min — ABNORMAL LOW (ref >60)
Glucose, Bld: 101 mg/dL — ABNORMAL HIGH (ref 70–99)
Potassium: 4.5 mmol/L (ref 3.5–5.1)
Sodium: 139 mmol/L (ref 135–145)

## 2021-04-04 LAB — ECHOCARDIOGRAM COMPLETE
Area-P 1/2: 3.6 cm2
Calc EF: 58.5 %
S' Lateral: 2.5 cm
Single Plane A2C EF: 61.7 %
Single Plane A4C EF: 56.7 %

## 2021-04-04 NOTE — Progress Notes (Signed)
  Echocardiogram 2D Echocardiogram has been performed.  Tara [name] 04/04/2021, 11:03 AM

## 2021-04-04 NOTE — Progress Notes (Signed)
PCP: Dr. Moshe Cipro Cardiology: Dr. Rayann Heman HF Cardiology: Dr. Aundra Dubin  75 y.o. returns for followup of CHF.   Dr. Rayann Heman has followed her for a short RP SVT.  This has been quiescent recently on Toprol XL.  No palpitations.   Patient was initially found to have low EF in 2014. Repeat echo in 6/17 showed EF 35-40%. Cardiolite in 8/17 showed fixed apical defect but no evidence for ischemia. She was admitted in 1/18 with PNA, DVT, and CHF exacerbation.  Echo at that time showed EF down to 20-25%.  Troponin was mildly elevated to around 0.6. She was diuresed and discharged.   Patient was diagnosed with breast cancer in 2008 and had lumpectomy, radiation, and chemotherapy which included epirubicin, and anthracycline.  Recurrent cancer in 2019 with right mastectomy and chemo with cyclophosphamide, MTX, and fluorouracil.   She had RHC/LHC in 10/18 showing no significant CAD and mildly elevate PCWP.  Cardiac MRI in 11/18 showed EF 49%, no LGE.   Echo in 5/21 showed EF 45%, global hypokinesis, mildly decreased RV systolic function.  Echo was done today and reviewed, EF 55% with normal RV and IVC.   She returns for followup of CHF.  Weight down 5 lbs.  She gets tired walking outside in the heat, no dyspnea or fatigue walking inside.  No orthopnea/PND.  No chest pain.  No lightheadedness.   Labs (7/18): K 4.3, creatinine 1.16, TSH normal, LDL 101 Labs (8/18): K 5.3, creatinine 1.45 Labs (10/18): K 4.3, creatinine 1.26 Labs (2/21): K 3.6, creatinine 1.45, BNP 239 Labs (7/21): K 4.9, creatinine 1.47, LDL 119 Labs (11/21): K 4, creatinine 1.38, LDL 89 Labs (4/22): K 4.2, creatinine 1.24  PMH: 1. 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  2. DVT in 1/18 in setting of PNA.  She was on Xarelto for about 6 months.  3. Breast cancer: Diagnosed on right in 2008.  She had lumpectomy, radiation, and chemotherapy.  She had fluorouracil, epirubicin, Cytoxan.  - Recent mammogram with right breast calcifications  => biopsy with complex sclerosing lesion.  - Recurrent cancer 2019, she had right mastectomy and chemotherapy with cyclophosphamide, MTX, fluorouracil.  4. GERD 5. Hyperlipidemia 6. Chronic systolic CHF: Uncertain cause of cardiomyopathy.  Echo in 2014 showed EF 40%.  It is possible that it is related to epirubicin use (anthracycline).  - Echo (6/17): EF 35-40%, mild LV dilation.  - Cardiolite (8/17): no ischemia, apical lateral/apical fixed perfusion defect.  - Echo (1/18): EF 20-25%, moderate MR, severe TR, D-shaped IV septum, PASP 35 mmHg.  - RHC/LHC (10/18): No angiographic CAD.  Mean RA 5, PA 44/11 mean 26, mean PCWP 18, CI 3.49.  - Cardiac MRI (11/18): EF 49%, diffuse mild hypokinesis, mild RV dilation with normal function, no LGE.  - Echo (5/21): EF 45%, global hypokinesis, mildly decreased RV systolic function.  - Echo (7/22): EF 55% with normal RV and IVC.  FH: Colon cancer.  No known heart disease.   SH: Married, never smoked.  Lives in [place].  Retired Marine scientist.    ROS: All systems reviewed and negative except as per HPI.   Current Outpatient Medications  Medication Sig Dispense Refill   acetaminophen (TYLENOL) 500 MG tablet Take 500-1,000 mg by mouth every 6 (six) hours as needed (FOR PAIN.).     aspirin EC 81 MG tablet Take 81 mg by mouth daily.      azelastine (ASTELIN) 0.1 % nasal spray Place 2 sprays into both nostrils 2 (two) times  daily. Use in each nostril as directed     clonazePAM (KLONOPIN) 0.5 MG tablet Take 1 tablet (0.5 mg total) by mouth at bedtime. 90 tablet 1   docusate sodium (COLACE) 100 MG capsule Take 200 mg by mouth 2 (two) times daily.      empagliflozin (JARDIANCE) 10 MG TABS tablet Take 1 tablet (10 mg total) by mouth daily before breakfast. 90 tablet 3   ENTRESTO 49-51 MG TAKE ONE TABLET BY MOUTH 2 TIMES A DAY 180 tablet 3   hydrocortisone (ANUSOL-HC) 2.5 % rectal cream APPLY RECTALLY TWICE A DAY. 30 g 0   lovastatin (MEVACOR) 40 MG tablet TAKE 1  TABLET BY MOUTH AT BEDTIME 90 tablet 2   metoCLOPramide (REGLAN) 10 MG tablet TAKE ONE TABLET BY MOUTH TWICE DAILY. 180 tablet 0   metoprolol succinate (TOPROL-XL) 50 MG 24 hr tablet Take 1 tablet (50 mg total) by mouth 2 (two) times daily. 180 tablet 3   montelukast (SINGULAIR) 10 MG tablet TAKE ONE TABLET BY MOUTH AT BEDTIME. 90 tablet 0   pantoprazole (PROTONIX) 40 MG tablet TAKE (1) TABLET BY MOUTH TWICE DAILY. 180 tablet 0   sertraline (ZOLOFT) 25 MG tablet TAKE 1 TABLET BY MOUTH ONCE A DAY. 30 tablet 0   spironolactone (ALDACTONE) 25 MG tablet TAKE 1 TABLET BY MOUTH ONCE A DAY. NEEDS APPOINTMENT FOR FUTURE FILLS. 60 tablet 0   No current facility-administered medications for this encounter.   BP 106/60   Pulse (!) 59   Wt 84.6 kg (186 lb 9.6 oz)   SpO2 97%   BMI 40.38 kg/m  General: NAD Neck: No JVD, no thyromegaly or thyroid nodule.  Lungs: Clear to auscultation bilaterally with normal respiratory effort. CV: Nondisplaced PMI.  Heart regular S1/S2, no S3/S4, no murmur.  No peripheral edema.  No carotid bruit.  Normal pedal pulses.  Abdomen: Soft, nontender, no hepatosplenomegaly, no distention.  Skin: Intact without lesions or rashes.  Neurologic: Alert and oriented x 3.  Psych: Normal affect. Extremities: No clubbing or cyanosis.  HEENT: Normal.   Assessment/Plan: 1. Chronic systolic CHF: Long-standing cardiomyopathy, since at least 2014.  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  Could be due to epirubicin exposure with 2008 breast cancer treatment.  Cardiac MRI in 11/18 showed EF up to 49% with no LGE.  Echo in 5/21 showed that EF remains in the 45% range.  Echo today showed EF up to 55%.  Euvolemic on exam today, NYHA class II symptoms.   - Continue spironolactone 25 mg daily.  BMET today.  - Continue Toprol XL 50 mg bid.  - Continue Entresto 49/51 bid.  - Continue Farxiga 10 mg daily.   - She does not need a diuretic.  - EF out of ICD range on last echo.   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  Continue Toprol XL.   Followup in 6 months but should get BMET again in 3 months.   Loralie Champagne 04/04/2021

## 2021-04-04 NOTE — Patient Instructions (Signed)
Labs done today, your results will be available in MyChart, we will contact you for abnormal readings.  Your physician recommends that you schedule a follow-up appointment in: 3 months for lab work  Your physician recommends that you schedule a follow-up appointment in: 6 months  If you have any questions or concerns before your next appointment please send [place] a message through [place] or call our office at [phone].    TO LEAVE A MESSAGE FOR THE NURSE SELECT OPTION 2, PLEASE LEAVE A MESSAGE INCLUDING: YOUR NAME DATE OF BIRTH CALL BACK NUMBER REASON FOR CALL**this is important as we prioritize the call backs  YOU WILL RECEIVE A CALL BACK THE SAME DAY AS LONG AS YOU CALL BEFORE 4:00 PM  milAt the [facility], you and your health needs are our priority. As part of our continuing mission to provide you with exceptional heart care, we have created designated Provider Care Teams. These Care Teams include your primary Cardiologist (physician) and Advanced Practice Providers (APPs- Physician Assistants and Nurse Practitioners) who all work together to provide you with the care you need, when you need it.   You may see any of the following providers on your designated Care Team at your next follow up: Dr Glori Bickers Dr Loralie Champagne Dr Patrice Paradise, NP Lyda Jester, [place] Ginnie Smart Audry Riles, PharmD   Please be sure to bring in all your medications bottles to every appointment.

## 2021-04-05 ENCOUNTER — Telehealth (HOSPITAL_COMMUNITY): Payer: Self-pay | Admitting: Cardiology

## 2021-04-05 DIAGNOSIS — I509 Heart failure, unspecified: Secondary | ICD-10-CM

## 2021-04-05 MED ORDER — ENTRESTO 24-26 MG PO TABS: 1.0000 | ORAL_TABLET | Freq: Two times a day (BID) | ORAL | 11 refills | Status: DC

## 2021-04-05 NOTE — Telephone Encounter (Signed)
-----   Message from Larey Dresser, MD sent at 04/04/2021  9:09 PM EDT ----- Decrease Entresto to 24/26 bid, increase fluid intake, repeat BMET 1 week.

## 2021-04-05 NOTE — Telephone Encounter (Signed)
Patient called.  Patient aware. Patient reports she will have labs repeated [facility]P [facility]

## 2021-04-11 ENCOUNTER — Other Ambulatory Visit (HOSPITAL_COMMUNITY): Payer: Self-pay

## 2021-04-11 DIAGNOSIS — I509 Heart failure, unspecified: Secondary | ICD-10-CM

## 2021-04-12 ENCOUNTER — Encounter: Payer: Self-pay | Admitting: Hematology and Oncology

## 2021-04-12 ENCOUNTER — Other Ambulatory Visit: Payer: Self-pay | Admitting: Family Medicine

## 2021-04-12 LAB — BASIC METABOLIC PANEL
BUN/Creatinine Ratio: 13 (ref 12–28)
BUN: 20 mg/dL (ref 8–27)
CO2: 16 mmol/L — ABNORMAL LOW (ref 20–29)
Calcium: 8.7 mg/dL (ref 8.7–10.3)
Chloride: 113 mmol/L — ABNORMAL HIGH (ref 96–106)
Creatinine, Ser: 1.52 mg/dL — ABNORMAL HIGH (ref 0.57–1.00)
Glucose: 97 mg/dL (ref 65–99)
Potassium: 4.5 mmol/L (ref 3.5–5.2)
Sodium: 146 mmol/L — ABNORMAL HIGH (ref 134–144)
eGFR: 36 mL/min/{1.73_m2} — ABNORMAL LOW (ref >59)

## 2021-04-25 ENCOUNTER — Encounter: Payer: Medicare Other | Admitting: Family Medicine

## 2021-05-01 ENCOUNTER — Other Ambulatory Visit: Payer: Self-pay

## 2021-05-01 ENCOUNTER — Ambulatory Visit (INDEPENDENT_AMBULATORY_CARE_PROVIDER_SITE_OTHER): Payer: Medicare Other | Admitting: *Deleted

## 2021-05-01 DIAGNOSIS — Z0000 Encounter for general adult medical examination without abnormal findings: Secondary | ICD-10-CM | POA: Diagnosis not present

## 2021-05-01 NOTE — Progress Notes (Signed)
Subjective:   Tara [name] is a 75 y.o. female who presents for Medicare Annual (Subsequent) preventive examination.  I connected with  KATONYA SCOBLE on 05/01/21 by an audio enabled telemedicine application and verified that I am speaking with the correct person using two identifiers.   I discussed the limitations, risks, security and privacy concerns of performing an evaluation and management service by telephone and the availability of in person appointments. I also discussed with the patient that there may be a patient responsible charge related to this service. The patient expressed understanding and verbally consented to this telephonic visit.   Review of Systems           Objective:    There were no vitals filed for this visit. There is no height or weight on file to calculate BMI.  Advanced Directives 10/25/2018 06/10/2018 06/02/2018 07/29/2017 07/24/2017 07/10/2017 04/17/2017  Does Patient Have a Medical Advance Directive? No No No No No No No  Would patient like information on creating a medical advance directive? No - Patient declined No - Patient declined - No - Patient declined No - Patient declined No - Patient declined -  Pre-existing out of facility DNR order (yellow form or pink MOST form) - - - - - - -    Current Medications (verified) Outpatient Encounter Medications as of 05/01/2021  Medication Sig   acetaminophen (TYLENOL) 500 MG tablet Take 500-1,000 mg by mouth every 6 (six) hours as needed (FOR PAIN.).   aspirin EC 81 MG tablet Take 81 mg by mouth daily.    azelastine (ASTELIN) 0.1 % nasal spray Place 2 sprays into both nostrils 2 (two) times daily. Use in each nostril as directed   clonazePAM (KLONOPIN) 0.5 MG tablet Take 1 tablet (0.5 mg total) by mouth at bedtime.   docusate sodium (COLACE) 100 MG capsule Take 200 mg by mouth 2 (two) times daily.    empagliflozin (JARDIANCE) 10 MG TABS tablet Take 1 tablet (10 mg total) by mouth daily before breakfast.    hydrocortisone (ANUSOL-HC) 2.5 % rectal cream APPLY RECTALLY TWICE A DAY.   lovastatin (MEVACOR) 40 MG tablet TAKE 1 TABLET BY MOUTH AT BEDTIME   metoCLOPramide (REGLAN) 10 MG tablet TAKE ONE TABLET BY MOUTH TWICE DAILY.   metoprolol succinate (TOPROL-XL) 50 MG 24 hr tablet Take 1 tablet (50 mg total) by mouth 2 (two) times daily.   montelukast (SINGULAIR) 10 MG tablet TAKE ONE TABLET BY MOUTH AT BEDTIME.   pantoprazole (PROTONIX) 40 MG tablet TAKE (1) TABLET BY MOUTH TWICE DAILY.   sacubitril-valsartan (ENTRESTO) 24-26 MG Take 1 tablet by mouth 2 (two) times daily.   sertraline (ZOLOFT) 25 MG tablet TAKE 1 TABLET BY MOUTH ONCE A DAY.   spironolactone (ALDACTONE) 25 MG tablet TAKE 1 TABLET BY MOUTH ONCE A DAY. NEEDS APPOINTMENT FOR FUTURE FILLS.   No facility-administered encounter medications on file as of 05/01/2021.    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   Anxiety    Breast cancer ([place]) 2008   right - s/p lumpectomy->chemo, radiation   Depression    Dysrhythmia    hx SVT   Family history of colon cancer    Family history of prostate cancer    GERD (gastroesophageal reflux disease)    H/O: hysterectomy    Hiatal hernia  History of cancer chemotherapy    History of radiation therapy    Hyperlipidemia    Hypertension 01/25/2018   Nonischemic cardiomyopathy ([place])    Personal history of radiation therapy 01/05/209   SVT (supraventricular tachycardia) ([place])    short RP SVT documented 0000000   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[place])    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)   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   Past Surgical History:  Procedure Laterality Date   [place]   fibroids,    BIOPSY  06/19/2016    Procedure: BIOPSY;  Surgeon: Daneil Dolin, MD;  [facility];  Service: Endoscopy;;  gastric duodenum   BREAST BIOPSY Right 12/16/2006   malignant   BREAST EXCISIONAL BIOPSY Right 2018   benign lumpectomy   BREAST LUMPECTOMY Right 02/2007   BREAST LUMPECTOMY WITH RADIOACTIVE SEED LOCALIZATION Right 07/29/2017   Procedure: RIGHT BREAST LUMPECTOMY WITH RADIOACTIVE SEED LOCALIZATION;  Surgeon: Fanny Skates, MD;  Location: [facility];  Service: General;  Laterality: Right;   BREAST SURGERY Right 2008   lumpectomy, cancer   CARDIAC CATHETERIZATION     CHOLECYSTECTOMY  1999   COLONOSCOPY  2008   Dr. Oneida Alar: multiple polyps. Path not available at time of visit.    COLONOSCOPY WITH PROPOFOL N/A 04/25/2014   Dr. Oneida Alar: Multiple tubular adenomas removed. Diverticulosis. Moderate internal hemorrhoids. Next colonoscopy planned for August 2018.   ESOPHAGOGASTRODUODENOSCOPY (EGD) WITH PROPOFOL N/A 06/19/2016   Procedure: ESOPHAGOGASTRODUODENOSCOPY (EGD) WITH PROPOFOL;  Surgeon: Daneil Dolin, MD;  [facility];  Service: Endoscopy;  Laterality: N/A;   MASTECTOMY W/ SENTINEL NODE BIOPSY Right 06/09/2018   Procedure: RIGHT TOTAL MASTECTOMY WITH SENTINEL LYMPH NODE BIOPSY;  Surgeon: Fanny Skates, MD;  Location: [facility];  Service: General;  Laterality: Right;   POLYPECTOMY N/A 04/25/2014   Procedure: POLYPECTOMY;  Surgeon: Danie Binder, MD;  Location: [facility];  Service: Endoscopy;  Laterality: N/A;  Ascending and Decending Colon x3 , Transverse colon x2, rectal   PORTACATH PLACEMENT N/A 06/09/2018   Procedure: INSERTION PORT-A-CATH;  Surgeon: Fanny Skates, MD;  Location: [facility];  Service: General;  Laterality: N/A;   RIGHT/LEFT HEART CATH AND CORONARY ANGIOGRAPHY N/A 07/10/2017   Procedure: RIGHT/LEFT HEART CATH AND CORONARY ANGIOGRAPHY;  Surgeon: Larey Dresser, MD;  Location: [facility];  Service: Cardiovascular;  Laterality: N/A;   Family History  Problem Relation Age of  Onset   Colon cancer Father 69       died at age 102   Hypertension Sister    Heart disease Sister    Cancer Sister        unknown form   Dementia Mother    Stroke Mother 47       left hemiparesis   Cancer Brother 41       prostate   Cancer Paternal Aunt        NOS   Lung cancer Paternal Uncle    Other Paternal Uncle        lightning strike   Other Paternal Uncle        hit by train   Other Paternal 42        old age   Cancer Cousin        NOS pat first cousin   Cancer Cousin        NOS pat first cousin  Prostate cancer Cousin        pat first cousin   Social History   Socioeconomic History   Marital status: Married    Spouse name: Not on file   Number of children: 2   Years of education: Not on file   Highest education level: Not on file  Occupational History   Occupation: disabled     Employer: UNEMPLOYED  Tobacco Use   Smoking status: Never   Smokeless tobacco: Never  Vaping Use   Vaping Use: Never used  Substance and Sexual Activity   Alcohol use: No   Drug use: No   Sexual activity: Not on file  Other Topics Concern   Not on file  Social History Narrative   Lives in [place] with family.  Does not routinely exercise.   Social Determinants of Health   Financial Resource Strain: Not on file  Food Insecurity: Not on file  Transportation Needs: Not on file  Physical Activity: Not on file  Stress: Not on file  Social Connections: Not on file    Tobacco Counseling Counseling given: Not Answered   Clinical Intake:                 Diabetic?No         Activities of Daily Living No flowsheet data found.  Patient Care Team: [name], MD as PCP - Bethena Roys, MD as Consulting Physician (Cardiology) Cottle, Billey Co., MD as Attending Physician (Psychiatry) Gala Romney Cristopher Estimable, MD as Consulting Physician (Gastroenterology)  Indicate any recent Medical Services you may have received from other than [facility]  providers in the past year (date may be approximate).     Assessment:   This is a routine wellness examination for [place].  Hearing/Vision screen No results found.  Dietary issues and exercise activities discussed:     Goals Addressed   None   Depression Screen PHQ 2/9 Scores 12/25/2020 08/07/2020 04/23/2020 03/22/2020 04/05/2019 04/05/2019 12/23/2018  PHQ - 2 Score 0 0 0 0 0 0 0  PHQ- 9 Score - 0 - - 0 - -  Exception Documentation - - - - Medical reason Medical reason -    Fall Risk Fall Risk  12/25/2020 08/07/2020 04/23/2020 03/22/2020 04/05/2019  Falls in the past year? 0 0 0 0 0  Number falls in past yr: 0 0 0 - 0  Injury with Fall? 0 0 0 - 0  Risk Factor Category  - - - - -  Risk for fall due to : No Fall Risks - - - -  Follow up Falls evaluation completed - - - -    FALL RISK PREVENTION PERTAINING TO THE HOME:  Any stairs in or around the home? Yes  If so, are there any without handrails? No  Home free of loose throw rugs in walkways, pet beds, electrical cords, etc? Yes  Adequate lighting in your home to reduce risk of falls? Yes   ASSISTIVE DEVICES UTILIZED TO PREVENT FALLS:  Life alert? No Use of a cane, walker or w/c? No  Grab bars in the bathroom? Yes  Shower chair or bench in shower? Yes  Elevated toilet seat or a handicapped toilet? Yes   TIMED UP AND GO:  Was the test performed? No .  Length of time to ambulate 10 feet: NA sec.    Cognitive Function:     6CIT Screen 04/23/2020 04/05/2019 04/05/2019  What Year? 0 points 0 points 0 points  What month? 0  points - 0 points  What time? 0 points 0 points 0 points  Count back from 20 0 points 0 points 0 points  Months in reverse 0 points 0 points 0 points  Repeat phrase 0 points 0 points 0 points  Total Score 0 - 0    Immunizations Immunization History  Administered Date(s) Administered   Fluad Quad(high Dose 65+) 08/08/2019, 08/07/2020   PFIZER(Purple Top)SARS-COV-2 Vaccination 12/08/2019, 01/02/2020,  09/13/2020   Pneumococcal Conjugate-13 12/11/2014   Pneumococcal Polysaccharide-23 07/03/2016    TDAP status: Up to date  Flu Vaccine status: Due, Education has been provided regarding the importance of this vaccine. Advised may receive this vaccine at local pharmacy or Health Dept. Aware to provide a copy of the vaccination record if obtained from local pharmacy or Health Dept. Verbalized acceptance and understanding.  Pneumococcal vaccine status: Up to date  Covid-19 vaccine status: Completed vaccines  Qualifies for Shingles Vaccine? Yes   Zostavax completed No   Shingrix Completed?: No.    Education has been provided regarding the importance of this vaccine. Patient has been advised to call insurance company to determine out of pocket expense if they have not yet received this vaccine. Advised may also receive vaccine at local pharmacy or Health Dept. Verbalized acceptance and understanding.  Screening Tests Health Maintenance  Topic Date Due   Zoster Vaccines- Shingrix (1 of 2) Never done   COVID-19 Vaccine (4 - Booster for Pfizer series) 12/12/2020   INFLUENZA VACCINE  04/22/2021   COLONOSCOPY (Pts 45-65yr Insurance coverage will need to be confirmed)  12/25/2021 (Originally 04/27/2019)   TETANUS/TDAP  07/21/2021   DEXA SCAN  Completed   Hepatitis C Screening  Completed   PNA vac Low Risk Adult  Completed   HPV VACCINES  Aged Out    Health Maintenance  Health Maintenance Due  Topic Date Due   Zoster Vaccines- Shingrix (1 of 2) Never done   COVID-19 Vaccine (4 - Booster for Pfizer series) 12/12/2020   INFLUENZA VACCINE  04/22/2021    Colorectal cancer screening: Type of screening: Colonoscopy. Completed 04-26-14. Repeat every 5 years    Bone Density status: Completed 02-14-14. Results reflect: Bone density results: NORMAL. Repeat every 5 years.  Lung Cancer Screening: (Low Dose CT Chest recommended if Age 75-80years, 30 pack-year currently smoking OR have quit w/in  15years.) does not qualify.   Lung Cancer Screening Referral: NA  Additional Screening:  Hepatitis C Screening: does qualify; Completed   Vision Screening: Recommended annual ophthalmology exams for early detection of glaucoma and other disorders of the eye. Is the patient up to date with their annual eye exam?  No  Who is the provider or what is the name of the office in which the patient attends annual eye exams? [facility]  If pt is not established with a provider, would they like to be referred to a provider to establish care? No .   Dental Screening: Recommended annual dental exams for proper oral hygiene  Community Resource Referral / Chronic Care Management: CRR required this visit?  No   CCM required this visit?  No      Plan:     I have personally reviewed and noted the following in the patient's chart:   Medical and social history Use of alcohol, tobacco or illicit drugs  Current medications and supplements including opioid prescriptions.  Functional ability and status Nutritional status Physical activity Advanced directives List of other physicians Hospitalizations, surgeries, and ER visits in  previous 12 months Vitals Screenings to include cognitive, depression, and falls Referrals and appointments  In addition, I have reviewed and discussed with patient certain preventive protocols, quality metrics, and best practice recommendations. A written personalized care plan for preventive services as well as general preventive health recommendations were provided to patient.     Shelda Altes, CMA   05/01/2021   Nurse Notes: Patient was at home. Provider was Ihor Dow MD who was in office. This was telehealth visit.

## 2021-05-01 NOTE — Patient Instructions (Signed)
Tara [name] , Thank you for taking time to come for your Medicare Wellness Visit. I appreciate your ongoing commitment to your health goals. Please review the following plan we discussed and let me know if I can assist you in the future.   Screening recommendations/referrals: Colonoscopy: Due now  Mammogram: Due now  Bone Density: Due now  Recommended yearly ophthalmology/optometry visit for glaucoma screening and checkup Recommended yearly dental visit for hygiene and checkup  Vaccinations: Influenza vaccine: Due now  Pneumococcal vaccine: Completed Tdap vaccine: Due 07-21-21 Shingles vaccine: Due now     Advanced directives: Information provided  Conditions/risks identified: Hypertension  Next appointment: 1 Year    Preventive Care 65 Years and Older, Female Preventive care refers to lifestyle choices and visits with your health care provider that can promote health and wellness. What does preventive care include? A yearly physical exam. This is also called an annual well check. Dental exams once or twice a year. Routine eye exams. Ask your health care provider how often you should have your eyes checked. Personal lifestyle choices, including: Daily care of your teeth and gums. Regular physical activity. Eating a healthy diet. Avoiding tobacco and drug use. Limiting alcohol use. Practicing safe sex. Taking low-dose aspirin every day. Taking vitamin and mineral supplements as recommended by your health care provider. What happens during an annual well check? The services and screenings done by your health care provider during your annual well check will depend on your age, overall health, lifestyle risk factors, and family history of disease. Counseling  Your health care provider may ask you questions about your: Alcohol use. Tobacco use. Drug use. Emotional well-being. Home and relationship well-being. Sexual activity. Eating habits. History of falls. Memory and  ability to understand (cognition). Work and work Statistician. Reproductive health. Screening  You may have the following tests or measurements: Height, weight, and BMI. Blood pressure. Lipid and cholesterol levels. These may be checked every 5 years, or more frequently if you are over 62 years old. Skin check. Lung cancer screening. You may have this screening every year starting at age 62 if you have a 30-pack-year history of smoking and currently smoke or have quit within the past 15 years. Fecal occult blood test (FOBT) of the stool. You may have this test every year starting at age 3. Flexible sigmoidoscopy or colonoscopy. You may have a sigmoidoscopy every 5 years or a colonoscopy every 10 years starting at age 71. Hepatitis C blood test. Hepatitis B blood test. Sexually transmitted disease (STD) testing. Diabetes screening. This is done by checking your blood sugar (glucose) after you have not eaten for a while (fasting). You may have this done every 1-3 years. Bone density scan. This is done to screen for osteoporosis. You may have this done starting at age 43. Mammogram. This may be done every 1-2 years. Talk to your health care provider about how often you should have regular mammograms. Talk with your health care provider about your test results, treatment options, and if necessary, the need for more tests. Vaccines  Your health care provider may recommend certain vaccines, such as: Influenza vaccine. This is recommended every year. Tetanus, diphtheria, and acellular pertussis (Tdap, Td) vaccine. You may need a Td booster every 10 years. Zoster vaccine. You may need this after age 68. Pneumococcal 13-valent conjugate (PCV13) vaccine. One dose is recommended after age 11. Pneumococcal polysaccharide (PPSV23) vaccine. One dose is recommended after age 53. Talk to your health care provider about which screenings  and vaccines you need and how often you need them. This information is  not intended to replace advice given to you by your health care provider. Make sure you discuss any questions you have with your health care provider. Document Released: 10/05/2015 Document Revised: 05/28/2016 Document Reviewed: 07/10/2015 Elsevier Interactive Patient Education  2017 [place] Prevention in the Home Falls can cause injuries. They can happen to people of all ages. There are many things you can do to make your home safe and to help prevent falls. What can I do on the outside of my home? Regularly fix the edges of walkways and driveways and fix any cracks. Remove anything that might make you trip as you walk through a door, such as a raised step or threshold. Trim any bushes or trees on the path to your home. Use bright outdoor lighting. Clear any walking paths of anything that might make someone trip, such as rocks or tools. Regularly check to see if handrails are loose or broken. Make sure that both sides of any steps have handrails. Any raised decks and porches should have guardrails on the edges. Have any leaves, snow, or ice cleared regularly. Use sand or salt on walking paths during winter. Clean up any spills in your garage right away. This includes oil or grease spills. What can I do in the bathroom? Use night lights. Install grab bars by the toilet and in the tub and shower. Do not use towel bars as grab bars. Use non-skid mats or decals in the tub or shower. If you need to sit down in the shower, use a plastic, non-slip stool. Keep the floor dry. Clean up any water that spills on the floor as soon as it happens. Remove soap buildup in the tub or shower regularly. Attach bath mats securely with double-sided non-slip rug tape. Do not have throw rugs and other things on the floor that can make you trip. What can I do in the bedroom? Use night lights. Make sure that you have a light by your bed that is easy to reach. Do not use any sheets or blankets that  are too big for your bed. They should not hang down onto the floor. Have a firm chair that has side arms. You can use this for support while you get dressed. Do not have throw rugs and other things on the floor that can make you trip. What can I do in the kitchen? Clean up any spills right away. Avoid walking on wet floors. Keep items that you use a lot in easy-to-reach places. If you need to reach something above you, use a strong step stool that has a grab bar. Keep electrical cords out of the way. Do not use floor polish or wax that makes floors slippery. If you must use wax, use non-skid floor wax. Do not have throw rugs and other things on the floor that can make you trip. What can I do with my stairs? Do not leave any items on the stairs. Make sure that there are handrails on both sides of the stairs and use them. Fix handrails that are broken or loose. Make sure that handrails are as long as the stairways. Check any carpeting to make sure that it is firmly attached to the stairs. Fix any carpet that is loose or worn. Avoid having throw rugs at the top or bottom of the stairs. If you do have throw rugs, attach them to the floor with carpet tape.  Make sure that you have a light switch at the top of the stairs and the bottom of the stairs. If you do not have them, ask someone to add them for you. What else can I do to help prevent falls? Wear shoes that: Do not have high heels. Have rubber bottoms. Are comfortable and fit you well. Are closed at the toe. Do not wear sandals. If you use a stepladder: Make sure that it is fully opened. Do not climb a closed stepladder. Make sure that both sides of the stepladder are locked into place. Ask someone to hold it for you, if possible. Clearly mark and make sure that you can see: Any grab bars or handrails. First and last steps. Where the edge of each step is. Use tools that help you move around (mobility aids) if they are needed. These  include: Canes. Walkers. Scooters. Crutches. Turn on the lights when you go into a dark area. Replace any light bulbs as soon as they burn out. Set up your furniture so you have a clear path. Avoid moving your furniture around. If any of your floors are uneven, fix them. If there are any pets around you, be aware of where they are. Review your medicines with your doctor. Some medicines can make you feel dizzy. This can increase your chance of falling. Ask your doctor what other things that you can do to help prevent falls. This information is not intended to replace advice given to you by your health care provider. Make sure you discuss any questions you have with your health care provider. Document Released: 07/05/2009 Document Revised: 02/14/2016 Document Reviewed: 10/13/2014 Elsevier Interactive Patient Education  2017 [employer].

## 2021-05-06 ENCOUNTER — Other Ambulatory Visit (HOSPITAL_COMMUNITY): Payer: Self-pay | Admitting: Cardiology

## 2021-05-06 ENCOUNTER — Other Ambulatory Visit: Payer: Self-pay | Admitting: Family Medicine

## 2021-05-07 ENCOUNTER — Other Ambulatory Visit: Payer: Self-pay

## 2021-05-07 ENCOUNTER — Ambulatory Visit
Admission: RE | Admit: 2021-05-07 | Discharge: 2021-05-07 | Disposition: A | Discharge status: Home / Self Care | Payer: Medicare Other | Source: Ambulatory Visit | Attending: Family Medicine | Admitting: Family Medicine

## 2021-05-07 DIAGNOSIS — Z1231 Encounter for screening mammogram for malignant neoplasm of breast: Secondary | ICD-10-CM

## 2021-05-28 ENCOUNTER — Ambulatory Visit (INDEPENDENT_AMBULATORY_CARE_PROVIDER_SITE_OTHER): Payer: Medicare Other | Admitting: Family Medicine

## 2021-05-28 ENCOUNTER — Other Ambulatory Visit: Payer: Self-pay

## 2021-05-28 ENCOUNTER — Encounter: Payer: Self-pay | Admitting: Family Medicine

## 2021-05-28 VITALS — BP 109/57 | HR 59 | Temp 98.6°F | Resp 18 | Ht <= 58 in | Wt 179.0 lb

## 2021-05-28 DIAGNOSIS — Z23 Encounter for immunization: Secondary | ICD-10-CM

## 2021-05-28 DIAGNOSIS — E7800 Pure hypercholesterolemia, unspecified: Secondary | ICD-10-CM | POA: Diagnosis not present

## 2021-05-28 DIAGNOSIS — C50411 Malignant neoplasm of upper-outer quadrant of right female breast: Secondary | ICD-10-CM

## 2021-05-28 DIAGNOSIS — Z171 Estrogen receptor negative status [ER-]: Secondary | ICD-10-CM

## 2021-05-28 DIAGNOSIS — I10 Essential (primary) hypertension: Secondary | ICD-10-CM | POA: Diagnosis not present

## 2021-05-28 DIAGNOSIS — K219 Gastro-esophageal reflux disease without esophagitis: Secondary | ICD-10-CM

## 2021-05-28 DIAGNOSIS — M810 Age-related osteoporosis without current pathological fracture: Secondary | ICD-10-CM

## 2021-05-28 DIAGNOSIS — E559 Vitamin D deficiency, unspecified: Secondary | ICD-10-CM

## 2021-05-28 DIAGNOSIS — I428 Other cardiomyopathies: Secondary | ICD-10-CM | POA: Diagnosis not present

## 2021-05-28 DIAGNOSIS — J302 Other seasonal allergic rhinitis: Secondary | ICD-10-CM

## 2021-05-28 DIAGNOSIS — K649 Unspecified hemorrhoids: Secondary | ICD-10-CM

## 2021-05-28 DIAGNOSIS — F324 Major depressive disorder, single episode, in partial remission: Secondary | ICD-10-CM

## 2021-05-28 MED ORDER — HYDROCORTISONE 2.5 % EX CREA: TOPICAL_CREAM | Freq: Two times a day (BID) | CUTANEOUS | 3 refills | Status: DC

## 2021-05-28 NOTE — Assessment & Plan Note (Signed)
Continue weekly fosamax, update dexa when due

## 2021-05-28 NOTE — Assessment & Plan Note (Signed)
Controlled, no change in medication DASH diet and commitment to daily physical activity for a minimum of 30 minutes discussed and encouraged, as a part of hypertension management. The importance of attaining a healthy weight is also discussed.  BP/Weight 05/28/2021 04/04/2021 12/25/2020 12/14/2020 08/07/2020 0000000 [id number]  Systolic BP 0000000 [id number] 0000000 [id number]  Diastolic BP 57 60 76 60 84 95 70  Wt. (Lbs) 179.04 186.6 180 191.4 199 195.4 196  BMI 38.74 40.38 38.95 41.42 43.06 42.28 42.41

## 2021-05-28 NOTE — Assessment & Plan Note (Signed)
Controlled, no change in medication  

## 2021-05-28 NOTE — Patient Instructions (Addendum)
Annual exam with MD Nov 17 or after, call if you need me sooner  Labs today CBC, lipid, cmp and EGFr, TSH and vit D  Flu vaccine today  Please get covid booster as soon as possible  Also need shingrix vaccines , 2, at your pharmacy  It is important that you exercise regularly at least 30 minutes 5 times a week. If you develop chest pain, have severe difficulty breathing, or feel very tired, stop exercising immediately and seek medical attention   Think about what you will eat, plan ahead. Choose " clean, green, fresh or frozen" over canned, processed or packaged foods which are more sugary, salty and fatty. 70 to 75% of food eaten should be vegetables and fruit. Three meals at set times with snacks allowed between meals, but they must be fruit or vegetables. Aim to eat over a 12 hour period , example 7 am to 7 pm, and STOP after  your last meal of the day. Drink water,generally about 64 ounces per day, no other drink is as healthy. Fruit juice is best enjoyed in a healthy way, by EATING the fruit.  Thanks for choosing [facility], we consider it a privelige to serve you.

## 2021-05-28 NOTE — Assessment & Plan Note (Signed)
Treatment completed, and Is UTD on annual mammogram

## 2021-05-28 NOTE — Assessment & Plan Note (Signed)
  Patient re-educated about  the importance of commitment to a  minimum of 150 minutes of exercise per week as able.  The importance of healthy food choices with portion control discussed, as well as eating regularly and within a 12 hour window most days. The need to choose "clean , green" food 50 to 75% of the time is discussed, as well as to make water the primary drink and set a goal of 64 ounces water daily.    Weight /BMI 05/28/2021 04/04/2021 12/25/2020  WEIGHT 179 lb 0.6 oz 186 lb 9.6 oz 180 lb  HEIGHT '4\' 9"'$  - '4\' 9"'$   BMI 38.74 kg/m2 40.38 kg/m2 38.95 kg/m2

## 2021-05-28 NOTE — Assessment & Plan Note (Signed)
Hyperlipidemia:Low fat diet discussed and encouraged.   Lipid Panel  Lab Results  Component Value Date   CHOL 155 08/07/2020   HDL 51 08/07/2020   LDLCALC 89 08/07/2020   TRIG 80 08/07/2020   CHOLHDL 3.0 08/07/2020     Updated lab needed at/ before next visit.

## 2021-05-28 NOTE — Assessment & Plan Note (Signed)
currently stable, asymptomatic

## 2021-05-28 NOTE — Assessment & Plan Note (Signed)
Needs refill on topical med for as needed use

## 2021-05-28 NOTE — Progress Notes (Signed)
Tara [name]     MRN: [id number]      DOB: 10-Mar-1946   HPI Tara [name] is here for follow up and re-evaluation of chronic medical conditions, medication management and review of any available recent lab and radiology data.  Preventive health is updated, specifically  Cancer screening and Immunization.   Questions or concerns regarding consultations or procedures which the PT has had in the interim are  addressed. The PT denies any adverse reactions to current medications since the last visit.  There are no new concerns.  There are no specific complaints   ROS Denies recent fever or chills. Denies sinus pressure, nasal congestion, ear pain or sore throat. Denies chest congestion, productive cough or wheezing. Denies chest pains, palpitations and leg swelling Denies abdominal pain, nausea, vomiting,diarrhea or constipation.   Denies dysuria, frequency, hesitancy or incontinence. Denies joint pain, swelling and limitation in mobility. Denies headaches, seizures, numbness, or tingling. Denies depression, anxiety or insomnia. Denies skin break down or rash.   PE  BP (!) 109/57   Pulse (!) 59   Temp 98.6 F (37 C)   Resp 18   Ht '4\' 9"'$  (1.448 m)   Wt 179 lb 0.6 oz (81.2 kg)   SpO2 93%   BMI 38.74 kg/m   Patient alert and oriented and in no cardiopulmonary distress.  HEENT: No facial asymmetry, EOMI,     Neck supple .  Chest: Clear to auscultation bilaterally.  CVS: S1, S2 no murmurs, no S3.Regular rate.  ABD: Soft non tender.   Ext: No edema  MS: Adequate ROM spine, shoulders, hips and knees.  Skin: Intact, no ulcerations or rash noted.  Psych: Good eye contact, normal affect. Memory intact not anxious or depressed appearing.  CNS: CN 2-12 intact, power,  normal throughout.no focal deficits noted.   Assessment & Plan Depression, major, single episode, in partial remission (HCC) Controlled, no change in medication   Hyperlipidemia Hyperlipidemia:Low fat diet  discussed and encouraged.   Lipid Panel  Lab Results  Component Value Date   CHOL 155 08/07/2020   HDL 51 08/07/2020   LDLCALC 89 08/07/2020   TRIG 80 08/07/2020   CHOLHDL 3.0 08/07/2020     Updated lab needed at/ before next visit.   Morbid obesity ([place])  Patient re-educated about  the importance of commitment to a  minimum of 150 minutes of exercise per week as able.  The importance of healthy food choices with portion control discussed, as well as eating regularly and within a 12 hour window most days. The need to choose "clean , green" food 50 to 75% of the time is discussed, as well as to make water the primary drink and set a goal of 64 ounces water daily.    Weight /BMI 05/28/2021 04/04/2021 12/25/2020  WEIGHT 179 lb 0.6 oz 186 lb 9.6 oz 180 lb  HEIGHT '4\' 9"'$  - '4\' 9"'$   BMI 38.74 kg/m2 40.38 kg/m2 38.95 kg/m2      Osteoporosis Continue weekly fosamax, update dexa when due  Malignant neoplasm of right breast ([place]) Treatment completed, and Is UTD on annual mammogram  Hypertension Controlled, no change in medication DASH diet and commitment to daily physical activity for a minimum of 30 minutes discussed and encouraged, as a part of hypertension management. The importance of attaining a healthy weight is also discussed.  BP/Weight 05/28/2021 04/04/2021 12/25/2020 12/14/2020 08/07/2020 0000000 [id number]  Systolic BP 0000000 [id number] 0000000 [id number]  Diastolic BP 57 60  76 60 84 95 70  Wt. (Lbs) 179.04 186.6 180 191.4 199 195.4 196  BMI 38.74 40.38 38.95 41.42 43.06 42.28 42.41       Hemorrhoid Needs refill on topical med for as needed use  GERD Controlled, no change in medication   Seasonal allergies Controlled, no change in medication   Nonischemic cardiomyopathy ([place]) currently stable, asymptomatic

## 2021-05-29 ENCOUNTER — Encounter: Payer: Self-pay | Admitting: Hematology and Oncology

## 2021-05-29 LAB — CMP14+EGFR
ALT: 7 IU/L (ref 0–32)
AST: 10 IU/L (ref 0–40)
Albumin/Globulin Ratio: 1.5 (ref 1.2–2.2)
Albumin: 4 g/dL (ref 3.7–4.7)
Alkaline Phosphatase: 75 IU/L (ref 44–121)
BUN/Creatinine Ratio: 16 (ref 12–28)
BUN: 30 mg/dL — ABNORMAL HIGH (ref 8–27)
Bilirubin Total: 0.3 mg/dL (ref 0.0–1.2)
CO2: 16 mmol/L — ABNORMAL LOW (ref 20–29)
Calcium: 9.4 mg/dL (ref 8.7–10.3)
Chloride: 113 mmol/L — ABNORMAL HIGH (ref 96–106)
Creatinine, Ser: 1.91 mg/dL — ABNORMAL HIGH (ref 0.57–1.00)
Globulin, Total: 2.6 g/dL (ref 1.5–4.5)
Glucose: 108 mg/dL — ABNORMAL HIGH (ref 65–99)
Potassium: 4.3 mmol/L (ref 3.5–5.2)
Sodium: 143 mmol/L (ref 134–144)
Total Protein: 6.6 g/dL (ref 6.0–8.5)
eGFR: 27 mL/min/{1.73_m2} — ABNORMAL LOW (ref >59)

## 2021-05-29 LAB — CBC
Hematocrit: 34.9 % (ref 34.0–46.6)
Hemoglobin: 11.1 g/dL (ref 11.1–15.9)
MCH: 27.1 pg (ref 26.6–33.0)
MCHC: 31.8 g/dL (ref 31.5–35.7)
MCV: 85 fL (ref 79–97)
Platelets: 288 10*3/uL (ref 150–450)
RBC: 4.09 x10E6/uL (ref 3.77–5.28)
RDW: 15.2 % (ref 11.7–15.4)
WBC: 11.7 10*3/uL — ABNORMAL HIGH (ref 3.4–10.8)

## 2021-05-29 LAB — TSH: TSH: 3.56 u[IU]/mL (ref 0.450–4.500)

## 2021-05-29 LAB — LIPID PANEL
Chol/HDL Ratio: 3.4 ratio (ref 0.0–4.4)
Cholesterol, Total: 146 mg/dL (ref 100–199)
HDL: 43 mg/dL (ref >39)
LDL Chol Calc (NIH): 84 mg/dL (ref 0–99)
Triglycerides: 101 mg/dL (ref 0–149)
VLDL Cholesterol Cal: 19 mg/dL (ref 5–40)

## 2021-05-29 LAB — VITAMIN D 25 HYDROXY (VIT D DEFICIENCY, FRACTURES): Vit D, 25-Hydroxy: 53.2 ng/mL (ref 30.0–100.0)

## 2021-06-06 ENCOUNTER — Other Ambulatory Visit: Payer: Self-pay | Admitting: Family Medicine

## 2021-06-17 ENCOUNTER — Other Ambulatory Visit: Payer: Self-pay | Admitting: Internal Medicine

## 2021-06-17 DIAGNOSIS — F411 Generalized anxiety disorder: Secondary | ICD-10-CM

## 2021-06-18 ENCOUNTER — Telehealth: Payer: Self-pay

## 2021-06-18 NOTE — Telephone Encounter (Signed)
Patient called need med refill clonazePAM (KLONOPIN) 0.5 MG tablet

## 2021-06-19 ENCOUNTER — Other Ambulatory Visit: Payer: Self-pay | Admitting: Family Medicine

## 2021-06-19 ENCOUNTER — Other Ambulatory Visit: Payer: Self-pay | Admitting: *Deleted

## 2021-06-19 DIAGNOSIS — F411 Generalized anxiety disorder: Secondary | ICD-10-CM

## 2021-06-19 MED ORDER — CLONAZEPAM 0.5 MG PO TABS: 0.5000 mg | ORAL_TABLET | Freq: Every day | ORAL | 1 refills | Status: DC

## 2021-06-19 NOTE — Telephone Encounter (Signed)
Refill sent to pt pharmacy 

## 2021-07-03 ENCOUNTER — Other Ambulatory Visit: Payer: Self-pay | Admitting: Family Medicine

## 2021-07-08 ENCOUNTER — Telehealth (HOSPITAL_COMMUNITY): Payer: Self-pay | Admitting: Vascular Surgery

## 2021-07-08 NOTE — Telephone Encounter (Signed)
There is not a [facility] on [address] in [place]. Called pt to get the correct location. No answer/left vm for return call.

## 2021-07-08 NOTE — Telephone Encounter (Signed)
Pt needs order sent to [facility] on [address] In [place] for lab work

## 2021-07-09 ENCOUNTER — Other Ambulatory Visit: Payer: Self-pay | Admitting: Family Medicine

## 2021-07-11 ENCOUNTER — Other Ambulatory Visit (HOSPITAL_COMMUNITY): Payer: Self-pay

## 2021-07-11 DIAGNOSIS — I509 Heart failure, unspecified: Secondary | ICD-10-CM

## 2021-07-12 ENCOUNTER — Encounter: Payer: Self-pay | Admitting: Hematology and Oncology

## 2021-07-12 LAB — BASIC METABOLIC PANEL
BUN/Creatinine Ratio: 11 — ABNORMAL LOW (ref 12–28)
BUN: 16 mg/dL (ref 8–27)
CO2: 17 mmol/L — ABNORMAL LOW (ref 20–29)
Calcium: 8.8 mg/dL (ref 8.7–10.3)
Chloride: 112 mmol/L — ABNORMAL HIGH (ref 96–106)
Creatinine, Ser: 1.45 mg/dL — ABNORMAL HIGH (ref 0.57–1.00)
Glucose: 88 mg/dL (ref 70–99)
Potassium: 4 mmol/L (ref 3.5–5.2)
Sodium: 145 mmol/L — ABNORMAL HIGH (ref 134–144)
eGFR: 38 mL/min/{1.73_m2} — ABNORMAL LOW (ref >59)

## 2021-08-09 ENCOUNTER — Other Ambulatory Visit: Payer: Self-pay | Admitting: Family Medicine

## 2021-08-14 ENCOUNTER — Ambulatory Visit (INDEPENDENT_AMBULATORY_CARE_PROVIDER_SITE_OTHER): Payer: Medicare Other | Admitting: Family Medicine

## 2021-08-14 ENCOUNTER — Encounter: Payer: Self-pay | Admitting: Family Medicine

## 2021-08-14 ENCOUNTER — Other Ambulatory Visit: Payer: Self-pay

## 2021-08-14 VITALS — BP 112/72 | HR 64 | Resp 17 | Ht <= 58 in | Wt 178.0 lb

## 2021-08-14 DIAGNOSIS — Z0000 Encounter for general adult medical examination without abnormal findings: Secondary | ICD-10-CM | POA: Diagnosis not present

## 2021-08-14 DIAGNOSIS — I10 Essential (primary) hypertension: Secondary | ICD-10-CM

## 2021-08-14 DIAGNOSIS — E7800 Pure hypercholesterolemia, unspecified: Secondary | ICD-10-CM

## 2021-08-14 NOTE — Assessment & Plan Note (Signed)

## 2021-08-14 NOTE — Patient Instructions (Signed)
N 5 months, call if you need me sooner  Please get covid vaccine asap.  Need shingrix and TdAP vaccines, check with pharmacy  You need an eye exam, please call and schedule  Congrats on weight loss, keep it up  Increase exercise to 5 days / week, 15 mins per session please   Fasting lipid, cmp and eGFr 5 days before follow up  Think about what you will eat, plan ahead. Choose " clean, green, fresh or frozen" over canned, processed or packaged foods which are more sugary, salty and fatty. 70 to 75% of food eaten should be vegetables and fruit. Three meals at set times with snacks allowed between meals, but they must be fruit or vegetables. Aim to eat over a 12 hour period , example 7 am to 7 pm, and STOP after  your last meal of the day. Drink water,generally about 64 ounces per day, no other drink is as healthy. Fruit juice is best enjoyed in a healthy way, by EATING the fruit.   Thanks for choosing [facility], we consider it a privelige to serve you.

## 2021-08-16 ENCOUNTER — Encounter: Payer: Self-pay | Admitting: Family Medicine

## 2021-08-16 NOTE — Progress Notes (Signed)
    Tara [name]     MRN: [id number]      DOB: 1945/10/12  HPI: Patient is in for annual physical exam. No other health concerns are expressed or addressed at the visit. Recent labs,  are reviewed. Immunization is reviewed .   PE: BP 112/72   Pulse 64   Resp 17   Ht 4\' 9"  (1.448 m)   Wt 178 lb (80.7 kg)   SpO2 91%   BMI 38.52 kg/m   Pleasant  female, alert and oriented x 3, in no cardio-pulmonary distress. Afebrile. HEENT No facial trauma or asymetry. Sinuses non tender.  Extra occullar muscles intact.. External ears normal, . Neck: decreased ROM, no adenopathy, no thyromegaly Chest: Clear to ascultation bilaterally.No crackles or wheezes. Non tender to palpation  y  Cardiovascular system; Heart sounds normal,  S1 and  S2 ,no S3. Systolic   murmur, no thrill. Apical beat not displaced Peripheral pulses normal.  Abdomen: Soft, non tender,  No guarding, tenderness or rebound.     Musculoskeletal exam: Decreased ROM of spine, hips , shoulders and knees. No deformity ,swelling or crepitus noted. No muscle wasting or atrophy.   Neurologic: Cranial nerves 2 to 12 intact. Power, tone ,sensation and reflexes normal throughout. No disturbance in gait. No tremor.  Skin: Intact, no ulceration, erythema , scaling or rash noted. Pigmentation normal throughout  Psych; Normal mood and affect. Judgement and concentration normal   Assessment & Plan:  Annual physical exam Annual exam as documented. Counseling done  re healthy lifestyle involving commitment to 150 minutes exercise per week, heart healthy diet, and attaining healthy weight.The importance of adequate sleep also discussed. Regular seat belt use and home safety, is also discussed. Changes in health habits are decided on by the patient with goals and time frames  set for achieving them. Immunization and cancer screening needs are specifically addressed at this visit.

## 2021-08-22 ENCOUNTER — Other Ambulatory Visit: Payer: Self-pay | Admitting: Family Medicine

## 2021-09-03 ENCOUNTER — Other Ambulatory Visit: Payer: Self-pay | Admitting: Family Medicine

## 2021-09-17 ENCOUNTER — Other Ambulatory Visit: Payer: Self-pay | Admitting: Family Medicine

## 2021-09-27 ENCOUNTER — Other Ambulatory Visit: Payer: Self-pay | Admitting: Family Medicine

## 2021-09-27 ENCOUNTER — Other Ambulatory Visit (HOSPITAL_COMMUNITY): Payer: Self-pay | Admitting: Cardiology

## 2021-10-09 ENCOUNTER — Other Ambulatory Visit: Payer: Self-pay

## 2021-10-09 ENCOUNTER — Encounter: Payer: Self-pay | Admitting: Family Medicine

## 2021-10-09 ENCOUNTER — Ambulatory Visit (INDEPENDENT_AMBULATORY_CARE_PROVIDER_SITE_OTHER): Payer: Medicare Other | Admitting: Family Medicine

## 2021-10-09 ENCOUNTER — Encounter: Payer: Self-pay | Admitting: Internal Medicine

## 2021-10-09 DIAGNOSIS — K649 Unspecified hemorrhoids: Secondary | ICD-10-CM

## 2021-10-09 MED ORDER — HYDROCORTISONE (PERIANAL) 2.5 % EX CREA: 1.0000 "application " | TOPICAL_CREAM | Freq: Two times a day (BID) | CUTANEOUS | 2 refills | Status: DC

## 2021-10-09 NOTE — Patient Instructions (Addendum)
F/u as before , clal if you need me sooner  Commit to daily stool softewners and also medication/ food to facilitate daily bowel movement if able  Increase water intake  You are e referred to gI  I am prescribing a medication to help[to shrink the hemorrhoids  Thanks for choosing [facility], we consider it a privelige to serve you.

## 2021-10-09 NOTE — Progress Notes (Signed)
Virtual Visit via Telephone Note  I connected with Tara [name] on 10/09/21 at 11:40 AM EST by telephone and verified that I am speaking with the correct person using two identifiers.  Location: Patient: home Provider: office   I discussed the limitations, risks, security and privacy concerns of performing an evaluation and management service by telephone and the availability of in person appointments. I also discussed with the patient that there may be a patient responsible charge related to this service. The patient expressed understanding and agreed to proceed.   History of Present Illness: 2 day h/o painful hemmorhoids , cnt sleep, now bleeding, small bleed this am   Observations/Objective: Ht 4\' 9"  (1.448 m)    Wt 176 lb (79.8 kg)    BMI 38.09 kg/m  Good communication with no confusion and intact memory. Alert and oriented x 3 No signs of respiratory distress during speech   Assessment and Plan: Hemorrhoids Current flare, commit to daily stool softeners and daily bM idf anle, refer GI and send in topical med   Follow Up Instructions:    I discussed the assessment and treatment plan with the patient. The patient was provided an opportunity to ask questions and all were answered. The patient agreed with the plan and demonstrated an understanding of the instructions.   The patient was advised to call back or seek an in-person evaluation if the symptoms worsen or if the condition fails to improve as anticipated.  I provided  8 minutes of non-face-to-face time during this encounter.   [name], MD

## 2021-10-09 NOTE — Assessment & Plan Note (Signed)
Current flare, commit to daily stool softeners and daily bM idf anle, refer GI and send in topical med

## 2021-10-31 ENCOUNTER — Telehealth: Payer: Self-pay | Admitting: Family Medicine

## 2021-10-31 ENCOUNTER — Other Ambulatory Visit: Payer: Self-pay

## 2021-10-31 MED ORDER — LOVASTATIN 40 MG PO TABS: 40.0000 mg | ORAL_TABLET | Freq: Every day | ORAL | 2 refills | Status: DC

## 2021-10-31 NOTE — Telephone Encounter (Signed)
Patient asking for rx for cranial prothesis (A cranial hair prosthesis is a custom hair system specifically designed for patients who have lost their hair due to medical conditions) ok to provide rx?

## 2021-10-31 NOTE — Telephone Encounter (Signed)
Pt would like LOVASTATIN sent to [facility], pt states that [facility] Is now charging pt for medication    Pt also would like a prescription for a cranial prothesis

## 2021-11-01 ENCOUNTER — Other Ambulatory Visit: Payer: Self-pay

## 2021-11-01 DIAGNOSIS — Z171 Estrogen receptor negative status [ER-]: Secondary | ICD-10-CM

## 2021-11-01 DIAGNOSIS — C50411 Malignant neoplasm of upper-outer quadrant of right female breast: Secondary | ICD-10-CM

## 2021-11-01 MED ORDER — UNABLE TO FIND: 0 refills | Status: DC

## 2021-11-01 NOTE — Telephone Encounter (Signed)
Rx put up front to be picked up

## 2021-11-11 ENCOUNTER — Ambulatory Visit (HOSPITAL_COMMUNITY)
Admission: RE | Admit: 2021-11-11 | Discharge: 2021-11-11 | Disposition: A | Discharge status: Home / Self Care | Payer: Medicare Other | Source: Ambulatory Visit | Attending: Cardiology | Admitting: Cardiology

## 2021-11-11 ENCOUNTER — Encounter (HOSPITAL_COMMUNITY): Payer: Self-pay | Admitting: Cardiology

## 2021-11-11 ENCOUNTER — Other Ambulatory Visit: Payer: Self-pay

## 2021-11-11 VITALS — BP 100/60 | HR 58 | Wt 176.6 lb

## 2021-11-11 DIAGNOSIS — Z79899 Other long term (current) drug therapy: Secondary | ICD-10-CM | POA: Insufficient documentation

## 2021-11-11 DIAGNOSIS — I471 Supraventricular tachycardia: Secondary | ICD-10-CM | POA: Insufficient documentation

## 2021-11-11 DIAGNOSIS — I428 Other cardiomyopathies: Secondary | ICD-10-CM

## 2021-11-11 DIAGNOSIS — I5022 Chronic systolic (congestive) heart failure: Secondary | ICD-10-CM | POA: Insufficient documentation

## 2021-11-11 DIAGNOSIS — Z853 Personal history of malignant neoplasm of breast: Secondary | ICD-10-CM | POA: Insufficient documentation

## 2021-11-11 LAB — BASIC METABOLIC PANEL
Anion gap: 9 (ref 5–15)
BUN: 20 mg/dL (ref 8–23)
CO2: 19 mmol/L — ABNORMAL LOW (ref 22–32)
Calcium: 8.8 mg/dL — ABNORMAL LOW (ref 8.9–10.3)
Chloride: 111 mmol/L (ref 98–111)
Creatinine, Ser: 1.58 mg/dL — ABNORMAL HIGH (ref 0.44–1.00)
GFR, Estimated: 34 mL/min — ABNORMAL LOW (ref >60)
Glucose, Bld: 102 mg/dL — ABNORMAL HIGH (ref 70–99)
Potassium: 4.4 mmol/L (ref 3.5–5.1)
Sodium: 139 mmol/L (ref 135–145)

## 2021-11-11 NOTE — Progress Notes (Signed)
PCP: Dr. Moshe Cipro Cardiology: Dr. Rayann Heman HF Cardiology: Dr. Aundra Dubin  76 y.o. returns for followup of CHF.   Dr. Rayann Heman has followed her for a short RP SVT.  This has been quiescent recently on Toprol XL.  No palpitations.   Patient was initially found to have low EF in 2014. Repeat echo in 6/17 showed EF 35-40%. Cardiolite in 8/17 showed fixed apical defect but no evidence for ischemia. She was admitted in 1/18 with PNA, DVT, and CHF exacerbation.  Echo at that time showed EF down to 20-25%.  Troponin was mildly elevated to around 0.6. She was diuresed and discharged.   Patient was diagnosed with breast cancer in 2008 and had lumpectomy, radiation, and chemotherapy which included epirubicin, and anthracycline.  Recurrent cancer in 2019 with right mastectomy and chemo with cyclophosphamide, MTX, and fluorouracil.   She had RHC/LHC in 10/18 showing no significant CAD and mildly elevate PCWP.  Cardiac MRI in 11/18 showed EF 49%, no LGE.   Echo in 5/21 showed EF 45%, global hypokinesis, mildly decreased RV systolic function.  Echo in 7/22 showed EF 55% with normal RV and IVC.   She returns for followup of CHF.  Weight down 10 lbs.  Mild dyspnea walking up stairs, otherwise no dyspnea.  No lightheadedness.  No chest pain.  No orthopnea/PND.  Feels good generally.    Labs (7/18): K 4.3, creatinine 1.16, TSH normal, LDL 101 Labs (8/18): K 5.3, creatinine 1.45 Labs (10/18): K 4.3, creatinine 1.26 Labs (2/21): K 3.6, creatinine 1.45, BNP 239 Labs (7/21): K 4.9, creatinine 1.47, LDL 119 Labs (11/21): K 4, creatinine 1.38, LDL 89 Labs (4/22): K 4.2, creatinine 1.24 Labs (9/22): LDL 84 Labs (10/22): K 4, creatinine 1.45  PMH: 1. 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  2. DVT in 1/18 in setting of PNA.  She was on Xarelto for about 6 months.  3. Breast cancer: Diagnosed on right in 2008.  She had lumpectomy, radiation, and chemotherapy.  She had fluorouracil, epirubicin, Cytoxan.  - Recent  mammogram with right breast calcifications => biopsy with complex sclerosing lesion.  - Recurrent cancer 2019, she had right mastectomy and chemotherapy with cyclophosphamide, MTX, fluorouracil.  4. GERD 5. Hyperlipidemia 6. Chronic systolic CHF: Uncertain cause of cardiomyopathy.  Echo in 2014 showed EF 40%.  It is possible that it is related to epirubicin use (anthracycline).  - Echo (6/17): EF 35-40%, mild LV dilation.  - Cardiolite (8/17): no ischemia, apical lateral/apical fixed perfusion defect.  - Echo (1/18): EF 20-25%, moderate MR, severe TR, D-shaped IV septum, PASP 35 mmHg.  - RHC/LHC (10/18): No angiographic CAD.  Mean RA 5, PA 44/11 mean 26, mean PCWP 18, CI 3.49.  - Cardiac MRI (11/18): EF 49%, diffuse mild hypokinesis, mild RV dilation with normal function, no LGE.  - Echo (5/21): EF 45%, global hypokinesis, mildly decreased RV systolic function.  - Echo (7/22): EF 55% with normal RV and IVC.  FH: Colon cancer.  No known heart disease.   SH: Married, never smoked.  Lives in [place].  Retired Marine scientist.    ROS: All systems reviewed and negative except as per HPI.   Current Outpatient Medications  Medication Sig Dispense Refill   acetaminophen (TYLENOL) 500 MG tablet Take 500-1,000 mg by mouth every 6 (six) hours as needed (FOR PAIN.).     aspirin EC 81 MG tablet Take 81 mg by mouth daily.      azelastine (ASTELIN) 0.1 % nasal spray Place into  both nostrils as needed for rhinitis. Use in each nostril as directed     clonazePAM (KLONOPIN) 0.5 MG tablet TAKE (1) TABLET BY MOUTH AT BEDTIME. 90 tablet 0   docusate sodium (COLACE) 100 MG capsule Take 200 mg by mouth 2 (two) times daily.      empagliflozin (JARDIANCE) 10 MG TABS tablet Take 1 tablet (10 mg total) by mouth daily before breakfast. 90 tablet 3   hydrocortisone (ANUSOL-HC) 2.5 % rectal cream APPLY RECTALLY TWICE A DAY. 30 g 0   hydrocortisone (PROCTOZONE-HC) 2.5 % rectal cream Place 1 application rectally 2 (two) times  daily. 30 g 2   hydrocortisone 2.5 % cream APPLY TO THE AFFECTED AREA(S) TWICE A DAY. 30 g 0   lovastatin (MEVACOR) 40 MG tablet Take 1 tablet (40 mg total) by mouth at bedtime. 90 tablet 2   metoCLOPramide (REGLAN) 10 MG tablet TAKE ONE TABLET BY MOUTH TWICE DAILY. 180 tablet 1   metoprolol succinate (TOPROL-XL) 50 MG 24 hr tablet Take 1 tablet (50 mg total) by mouth 2 (two) times daily. NEEDS OFFICE VISIT FOR FURTHER REFILLS 60 tablet 0   montelukast (SINGULAIR) 10 MG tablet TAKE ONE TABLET BY MOUTH AT BEDTIME. 90 tablet 1   pantoprazole (PROTONIX) 40 MG tablet TAKE (1) TABLET BY MOUTH TWICE DAILY. 180 tablet 1   sacubitril-valsartan (ENTRESTO) 24-26 MG Take 1 tablet by mouth 2 (two) times daily. 60 tablet 11   sertraline (ZOLOFT) 25 MG tablet TAKE 1 TABLET BY MOUTH ONCE A DAY. 30 tablet 4   spironolactone (ALDACTONE) 25 MG tablet Take 1 tablet (25 mg total) by mouth daily. 90 tablet 3   UNABLE TO FIND Cranial prothesis  DX C50.911 L65.9 1 each 0   No current facility-administered medications for this encounter.   BP 100/60    Pulse (!) 58    Wt 80.1 kg (176 lb 9.6 oz)    SpO2 97%    BMI 38.22 kg/m  General: NAD Neck: No JVD, no thyromegaly or thyroid nodule.  Lungs: Clear to auscultation bilaterally with normal respiratory effort. CV: Nondisplaced PMI.  Heart regular S1/S2, no S3/S4, no murmur.  No peripheral edema.  No carotid bruit.  Normal pedal pulses.  Abdomen: Soft, nontender, no hepatosplenomegaly, no distention.  Skin: Intact without lesions or rashes.  Neurologic: Alert and oriented x 3.  Psych: Normal affect. Extremities: No clubbing or cyanosis.  HEENT: Normal.   Assessment/Plan: 1. Chronic systolic CHF: Long-standing cardiomyopathy, since at least 2014.  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  Could be due to epirubicin exposure with 2008 breast cancer treatment.  Cardiac MRI in 11/18 showed EF up to 49% with no LGE.  Echo in 5/21 showed that EF remains in  the 45% range.  Echo in 7/22 showed EF up to 55%.  Euvolemic on exam today, NYHA class II symptoms.   - Continue spironolactone 25 mg daily.  BMET today.  - Continue Toprol XL 50 mg bid.  - Continue Entresto 49/51 bid.  - Continue Farxiga 10 mg daily.   - She does not need a diuretic.  - Repeat echo in 6 months to make sure that EF remains in the normal range.  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  Continue Toprol XL.   Followup in 6 months with echo but should get BMET again in 3 months.   Loralie Champagne 11/11/2021

## 2021-11-11 NOTE — Patient Instructions (Signed)
Thank you for your follow up.  There has been no changes to your medications.  Please cancel all previous orders for current medication. Change in dosage or pill size.   Your physician has requested that you have an echocardiogram. Echocardiography is a painless test that uses sound waves to create images of your heart. It provides your doctor with information about the size and shape of your heart and how well your hearts chambers and valves are working. This procedure takes approximately one hour. There are no restrictions for this procedure.  Your physician recommends that you schedule a follow-up appointment in: 6 months (August 2023) ** please call the office in June to make your follow up appointment**  If you have any questions or concerns before your next appointment please send [place] a message through [place] or call our office at [phone].    TO LEAVE A MESSAGE FOR THE NURSE SELECT OPTION 2, PLEASE LEAVE A MESSAGE INCLUDING: YOUR NAME DATE OF BIRTH CALL BACK NUMBER REASON FOR CALL**this is important as we prioritize the call backs  YOU WILL RECEIVE A CALL BACK THE SAME DAY AS LONG AS YOU CALL BEFORE 4:00 PM  At the [facility], you and your health needs are our priority. As part of our continuing mission to provide you with exceptional heart care, we have created designated Provider Care Teams. These Care Teams include your primary Cardiologist (physician) and Advanced Practice Providers (APPs- Physician Assistants and Nurse Practitioners) who all work together to provide you with the care you need, when you need it.   You may see any of the following providers on your designated Care Team at your next follow up: Dr Glori Bickers Dr Haynes Kerns, NP Lyda Jester, [facility] [place], [place] Audry Riles, PharmD   Please be sure to bring in all your medications bottles to every appointment.

## 2021-11-18 ENCOUNTER — Other Ambulatory Visit: Payer: Self-pay | Admitting: Family Medicine

## 2021-12-09 ENCOUNTER — Other Ambulatory Visit: Payer: Self-pay | Admitting: Family Medicine

## 2022-01-02 DIAGNOSIS — E7800 Pure hypercholesterolemia, unspecified: Secondary | ICD-10-CM | POA: Diagnosis not present

## 2022-01-02 DIAGNOSIS — I10 Essential (primary) hypertension: Secondary | ICD-10-CM | POA: Diagnosis not present

## 2022-01-03 ENCOUNTER — Other Ambulatory Visit (HOSPITAL_COMMUNITY): Payer: Self-pay | Admitting: Cardiology

## 2022-01-03 ENCOUNTER — Encounter: Payer: Self-pay | Admitting: Hematology and Oncology

## 2022-01-03 LAB — LIPID PANEL
Chol/HDL Ratio: 3.5 ratio (ref 0.0–4.4)
Cholesterol, Total: 173 mg/dL (ref 100–199)
HDL: 50 mg/dL (ref >39)
LDL Chol Calc (NIH): 106 mg/dL — ABNORMAL HIGH (ref 0–99)
Triglycerides: 94 mg/dL (ref 0–149)
VLDL Cholesterol Cal: 17 mg/dL (ref 5–40)

## 2022-01-03 LAB — CMP14+EGFR
ALT: 7 IU/L (ref 0–32)
AST: 10 IU/L (ref 0–40)
Albumin/Globulin Ratio: 1.5 (ref 1.2–2.2)
Albumin: 4 g/dL (ref 3.7–4.7)
Alkaline Phosphatase: 67 IU/L (ref 44–121)
BUN/Creatinine Ratio: 18 (ref 12–28)
BUN: 30 mg/dL — ABNORMAL HIGH (ref 8–27)
Bilirubin Total: 0.3 mg/dL (ref 0.0–1.2)
CO2: 16 mmol/L — ABNORMAL LOW (ref 20–29)
Calcium: 9.5 mg/dL (ref 8.7–10.3)
Chloride: 111 mmol/L — ABNORMAL HIGH (ref 96–106)
Creatinine, Ser: 1.7 mg/dL — ABNORMAL HIGH (ref 0.57–1.00)
Globulin, Total: 2.7 g/dL (ref 1.5–4.5)
Glucose: 99 mg/dL (ref 70–99)
Potassium: 4.1 mmol/L (ref 3.5–5.2)
Sodium: 142 mmol/L (ref 134–144)
Total Protein: 6.7 g/dL (ref 6.0–8.5)
eGFR: 31 mL/min/{1.73_m2} — ABNORMAL LOW (ref >59)

## 2022-01-06 ENCOUNTER — Encounter: Payer: Self-pay | Admitting: Family Medicine

## 2022-01-07 ENCOUNTER — Ambulatory Visit: Payer: Medicare Other | Admitting: Gastroenterology

## 2022-01-14 ENCOUNTER — Encounter: Payer: Self-pay | Admitting: Family Medicine

## 2022-01-14 ENCOUNTER — Ambulatory Visit (INDEPENDENT_AMBULATORY_CARE_PROVIDER_SITE_OTHER): Payer: Medicare Other | Admitting: Family Medicine

## 2022-01-14 VITALS — BP 120/80 | HR 61 | Resp 16 | Ht <= 58 in | Wt 173.8 lb

## 2022-01-14 DIAGNOSIS — F324 Major depressive disorder, single episode, in partial remission: Secondary | ICD-10-CM

## 2022-01-14 DIAGNOSIS — I428 Other cardiomyopathies: Secondary | ICD-10-CM

## 2022-01-14 DIAGNOSIS — F5105 Insomnia due to other mental disorder: Secondary | ICD-10-CM

## 2022-01-14 DIAGNOSIS — I10 Essential (primary) hypertension: Secondary | ICD-10-CM

## 2022-01-14 DIAGNOSIS — E559 Vitamin D deficiency, unspecified: Secondary | ICD-10-CM

## 2022-01-14 DIAGNOSIS — D126 Benign neoplasm of colon, unspecified: Secondary | ICD-10-CM

## 2022-01-14 DIAGNOSIS — E7800 Pure hypercholesterolemia, unspecified: Secondary | ICD-10-CM

## 2022-01-14 MED ORDER — ROSUVASTATIN CALCIUM 20 MG PO TABS: 20.0000 mg | ORAL_TABLET | Freq: Every day | ORAL | 5 refills | Status: DC

## 2022-01-14 NOTE — Patient Instructions (Addendum)
F/U end August, call of you need me sooner ? ?My condolence and prayers as you grieve the recent loss of your brother ? ?Stop lovastatin, new for cholesterol is rosuvastatin 20 mg daily ? ?Increase green foods, reduce processed, sweet, and oily foods ? ?Please get fasting cBC, lipid, cmp and EGFr, tSH and vit D 1 week before next appointment. ? ?It is important that you exercise regularly at least 30 minutes 5 times a week. If you develop chest pain, have severe difficulty breathing, or feel very tired, stop exercising immediately and seek medical attention  ? ? ?Thanks for choosing [facility], we consider it a privelige to serve you. ? ?

## 2022-01-20 ENCOUNTER — Encounter: Payer: Self-pay | Admitting: Family Medicine

## 2022-01-20 MED ORDER — CLONAZEPAM 0.5 MG PO TABS: 0.5000 mg | ORAL_TABLET | Freq: Every day | ORAL | 5 refills | Status: DC

## 2022-01-20 MED ORDER — SERTRALINE HCL 25 MG PO TABS: 25.0000 mg | ORAL_TABLET | Freq: Every day | ORAL | 5 refills | Status: DC

## 2022-01-20 NOTE — Assessment & Plan Note (Signed)
No s/s of decompensation, no med change and cardiology follow up as planned ?

## 2022-01-20 NOTE — Assessment & Plan Note (Signed)
Controlled, no change in medication  

## 2022-01-20 NOTE — Assessment & Plan Note (Signed)
?  Patient re-educated about  the importance of commitment to a  minimum of 150 minutes of exercise per week as able. ? ?The importance of healthy food choices with portion control discussed, as well as eating regularly and within a 12 hour window most days. ?The need to choose "clean , green" food 50 to 75% of the time is discussed, as well as to make water the primary drink and set a goal of 64 ounces water daily. ? ?  ? ?  01/14/2022  ? 11:04 AM 11/11/2021  ? 10:14 AM 10/09/2021  ? 11:28 AM  ?Weight /BMI  ?Weight 173 lb 12.8 oz 176 lb 9.6 oz 176 lb  ?Height '4\' 9"'$  (1.448 m)  '4\' 9"'$  (1.448 m)  ?BMI 37.61 kg/m2 38.22 kg/m2 38.09 kg/m2  ? ? ? ?

## 2022-01-20 NOTE — Assessment & Plan Note (Signed)
Colonoscopy past due , states does not want to do this right now , but will likely have it later this year ?

## 2022-01-20 NOTE — Assessment & Plan Note (Signed)
Hyperlipidemia:Low fat diet discussed and encouraged. ? ? ?Lipid Panel  ?Lab Results  ?Component Value Date  ? CHOL 173 01/02/2022  ? HDL 50 01/02/2022  ? LDLCALC 106 (H) 01/02/2022  ? TRIG 94 01/02/2022  ? CHOLHDL 3.5 01/02/2022  ? ? ? ? change to crestor and reduce fat intake ?

## 2022-01-20 NOTE — Assessment & Plan Note (Signed)
Sleep hygiene reviewed and written information offered also. Prescription sent for  medication needed.  

## 2022-01-20 NOTE — Assessment & Plan Note (Signed)
DASH diet and commitment to daily physical activity for a minimum of 30 minutes discussed and encouraged, as a part of hypertension management. ?The importance of attaining a healthy weight is also discussed. ? ? ?  01/14/2022  ? 11:14 AM 01/14/2022  ? 11:04 AM 11/11/2021  ? 10:14 AM 10/09/2021  ? 11:28 AM 08/14/2021  ?  9:47 AM 05/28/2021  ? 11:09 AM 04/04/2021  ? 11:13 AM  ?BP/Weight  ?Systolic BP 347 425 956  387 109 106  ?Diastolic BP 80 68 60  72 57 60  ?Wt. (Lbs)  173.8 176.6 176 178 179.04 186.6  ?BMI  37.61 kg/m2 38.22 kg/m2 38.09 kg/m2 38.52 kg/m2 38.74 kg/m2 40.38 kg/m2  ? ?Controlled, no change in medication ? ? ? ?

## 2022-01-20 NOTE — Progress Notes (Signed)
? ?JAYCELYNN [name]     MRN: [id number]      DOB: 05-19-1946 ? ? ?HPI ?[name] is here for follow up and re-evaluation of chronic medical conditions, medication management and review of any available recent lab and radiology data.  ?Preventive health is updated, specifically  Cancer screening and Immunization.   ?Questions or concerns regarding consultations or procedures which the PT has had in the interim are  addressed. ?The PT denies any adverse reactions to current medications since the last visit.  ?Currently mourning recent unexpected loss of her brother  ? ?ROS ?Denies recent fever or chills. ?Denies sinus pressure, nasal congestion, ear pain or sore throat. ?Denies chest congestion, productive cough or wheezing. ?Denies chest pains, palpitations and leg swelling ?Denies abdominal pain, nausea, vomiting,diarrhea or constipation.   ?Denies dysuria, frequency, hesitancy or incontinence. ?Denies joint pain, swelling and limitation in mobility. ?Denies headaches, seizures, numbness, or tingling. ?Mild  depression and  anxiety and insomnia due to recent loss ?Denies skin break down or rash. ? ? ?PE ? ?BP 120/80   Pulse 61   Resp 16   Ht '4\' 9"'$  (1.448 m)   Wt 173 lb 12.8 oz (78.8 kg)   SpO2 92%   BMI 37.61 kg/m?  ? ?Patient alert and oriented and in no cardiopulmonary distress. ? ?HEENT: No facial asymmetry, EOMI,     Neck supple . ? ?Chest: Clear to auscultation bilaterally. ? ?CVS: S1, S2 no murmurs, no S3.Regular rate. ? ?ABD: Soft non tender.  ? ?Ext: No edema ? ?MS: Adequate ROM spine, shoulders, hips and knees. ? ?Skin: Intact, no ulcerations or rash noted. ? ?Psych: Good eye contact, normal affect. Memory intact not anxious or depressed appearing. ? ?CNS: CN 2-12 intact, power,  normal throughout.no focal deficits noted. ? ? ?Assessment & Plan ? ?Nonischemic cardiomyopathy ([place]) ?No s/s of decompensation, no med change and cardiology follow up as planned ? ?Hyperlipidemia ?Hyperlipidemia:Low fat diet  discussed and encouraged. ? ? ?Lipid Panel  ?Lab Results  ?Component Value Date  ? CHOL 173 01/02/2022  ? HDL 50 01/02/2022  ? LDLCALC 106 (H) 01/02/2022  ? TRIG 94 01/02/2022  ? CHOLHDL 3.5 01/02/2022  ? ? ? ? change to crestor and reduce fat intake ? ?Hypertension ?DASH diet and commitment to daily physical activity for a minimum of 30 minutes discussed and encouraged, as a part of hypertension management. ?The importance of attaining a healthy weight is also discussed. ? ? ?  01/14/2022  ? 11:14 AM 01/14/2022  ? 11:04 AM 11/11/2021  ? 10:14 AM 10/09/2021  ? 11:28 AM 08/14/2021  ?  9:47 AM 05/28/2021  ? 11:09 AM 04/04/2021  ? 11:13 AM  ?BP/Weight  ?Systolic BP 505 397 673  419 109 106  ?Diastolic BP 80 68 60  72 57 60  ?Wt. (Lbs)  173.8 176.6 176 178 179.04 186.6  ?BMI  37.61 kg/m2 38.22 kg/m2 38.09 kg/m2 38.52 kg/m2 38.74 kg/m2 40.38 kg/m2  ? ?Controlled, no change in medication ? ? ? ? ?Insomnia related to another mental disorder ?Sleep hygiene reviewed and written information offered also. ?Prescription sent for  medication needed. ? ? ?Depression, major, single episode, in partial remission ([place]) ?Controlled, no change in medication ? ? ?Colon adenomas ?Colonoscopy past due , states does not want to do this right now , but will likely have it later this year ? ?Morbid obesity ([place]) ? ?Patient re-educated about  the importance of commitment to a  minimum of  150 minutes of exercise per week as able. ? ?The importance of healthy food choices with portion control discussed, as well as eating regularly and within a 12 hour window most days. ?The need to choose "clean , green" food 50 to 75% of the time is discussed, as well as to make water the primary drink and set a goal of 64 ounces water daily. ? ?  ? ?  01/14/2022  ? 11:04 AM 11/11/2021  ? 10:14 AM 10/09/2021  ? 11:28 AM  ?Weight /BMI  ?Weight 173 lb 12.8 oz 176 lb 9.6 oz 176 lb  ?Height '4\' 9"'$  (1.448 m)  '4\' 9"'$  (1.448 m)  ?BMI 37.61 kg/m2 38.22 kg/m2 38.09 kg/m2   ? ? ? ? ?

## 2022-01-29 ENCOUNTER — Other Ambulatory Visit (HOSPITAL_COMMUNITY): Payer: Self-pay | Admitting: *Deleted

## 2022-01-29 MED ORDER — METOPROLOL SUCCINATE ER 50 MG PO TB24: 50.0000 mg | ORAL_TABLET | Freq: Two times a day (BID) | ORAL | 3 refills | Status: DC

## 2022-02-10 ENCOUNTER — Other Ambulatory Visit (HOSPITAL_COMMUNITY): Payer: Self-pay | Admitting: Cardiology

## 2022-02-24 ENCOUNTER — Other Ambulatory Visit: Payer: Self-pay | Admitting: Family Medicine

## 2022-02-27 ENCOUNTER — Emergency Department (HOSPITAL_COMMUNITY): Payer: Medicare Other

## 2022-02-27 ENCOUNTER — Inpatient Hospital Stay (HOSPITAL_COMMUNITY)
Admission: EM | Admit: 2022-02-27 | Discharge: 2022-03-13 | DRG: 682 | Disposition: A | Discharge status: Home Health | Payer: Medicare Other | Attending: Internal Medicine | Admitting: Internal Medicine

## 2022-02-27 ENCOUNTER — Other Ambulatory Visit: Payer: Self-pay

## 2022-02-27 ENCOUNTER — Encounter (HOSPITAL_COMMUNITY): Payer: Self-pay | Admitting: Emergency Medicine

## 2022-02-27 DIAGNOSIS — R4701 Aphasia: Secondary | ICD-10-CM | POA: Diagnosis present

## 2022-02-27 DIAGNOSIS — R9431 Abnormal electrocardiogram [ECG] [EKG]: Secondary | ICD-10-CM | POA: Diagnosis not present

## 2022-02-27 DIAGNOSIS — I5022 Chronic systolic (congestive) heart failure: Secondary | ICD-10-CM | POA: Insufficient documentation

## 2022-02-27 DIAGNOSIS — I130 Hypertensive heart and chronic kidney disease with heart failure and stage 1 through stage 4 chronic kidney disease, or unspecified chronic kidney disease: Secondary | ICD-10-CM | POA: Diagnosis not present

## 2022-02-27 DIAGNOSIS — E8721 Acute metabolic acidosis: Secondary | ICD-10-CM

## 2022-02-27 DIAGNOSIS — I5032 Chronic diastolic (congestive) heart failure: Secondary | ICD-10-CM | POA: Diagnosis not present

## 2022-02-27 DIAGNOSIS — Z886 Allergy status to analgesic agent status: Secondary | ICD-10-CM

## 2022-02-27 DIAGNOSIS — I428 Other cardiomyopathies: Secondary | ICD-10-CM | POA: Diagnosis present

## 2022-02-27 DIAGNOSIS — E872 Acidosis, unspecified: Secondary | ICD-10-CM | POA: Diagnosis not present

## 2022-02-27 DIAGNOSIS — R001 Bradycardia, unspecified: Secondary | ICD-10-CM | POA: Diagnosis not present

## 2022-02-27 DIAGNOSIS — Z888 Allergy status to other drugs, medicaments and biological substances status: Secondary | ICD-10-CM

## 2022-02-27 DIAGNOSIS — N1832 Chronic kidney disease, stage 3b: Secondary | ICD-10-CM | POA: Diagnosis not present

## 2022-02-27 DIAGNOSIS — M6282 Rhabdomyolysis: Secondary | ICD-10-CM | POA: Diagnosis present

## 2022-02-27 DIAGNOSIS — T796XXA Traumatic ischemia of muscle, initial encounter: Secondary | ICD-10-CM

## 2022-02-27 DIAGNOSIS — Z8249 Family history of ischemic heart disease and other diseases of the circulatory system: Secondary | ICD-10-CM

## 2022-02-27 DIAGNOSIS — D721 Eosinophilia, unspecified: Secondary | ICD-10-CM | POA: Diagnosis present

## 2022-02-27 DIAGNOSIS — F419 Anxiety disorder, unspecified: Secondary | ICD-10-CM | POA: Diagnosis not present

## 2022-02-27 DIAGNOSIS — E860 Dehydration: Secondary | ICD-10-CM | POA: Diagnosis present

## 2022-02-27 DIAGNOSIS — D631 Anemia in chronic kidney disease: Secondary | ICD-10-CM | POA: Diagnosis present

## 2022-02-27 DIAGNOSIS — G8929 Other chronic pain: Secondary | ICD-10-CM | POA: Diagnosis present

## 2022-02-27 DIAGNOSIS — I272 Pulmonary hypertension, unspecified: Secondary | ICD-10-CM | POA: Diagnosis not present

## 2022-02-27 DIAGNOSIS — K5900 Constipation, unspecified: Secondary | ICD-10-CM | POA: Diagnosis present

## 2022-02-27 DIAGNOSIS — G936 Cerebral edema: Secondary | ICD-10-CM | POA: Diagnosis not present

## 2022-02-27 DIAGNOSIS — T380X5A Adverse effect of glucocorticoids and synthetic analogues, initial encounter: Secondary | ICD-10-CM | POA: Diagnosis not present

## 2022-02-27 DIAGNOSIS — D429 Neoplasm of uncertain behavior of meninges, unspecified: Secondary | ICD-10-CM

## 2022-02-27 DIAGNOSIS — E871 Hypo-osmolality and hyponatremia: Secondary | ICD-10-CM | POA: Diagnosis not present

## 2022-02-27 DIAGNOSIS — I959 Hypotension, unspecified: Secondary | ICD-10-CM | POA: Diagnosis not present

## 2022-02-27 DIAGNOSIS — D329 Benign neoplasm of meninges, unspecified: Secondary | ICD-10-CM | POA: Diagnosis not present

## 2022-02-27 DIAGNOSIS — Z20822 Contact with and (suspected) exposure to covid-19: Secondary | ICD-10-CM | POA: Diagnosis not present

## 2022-02-27 DIAGNOSIS — I4891 Unspecified atrial fibrillation: Secondary | ICD-10-CM | POA: Diagnosis not present

## 2022-02-27 DIAGNOSIS — I471 Supraventricular tachycardia: Secondary | ICD-10-CM | POA: Diagnosis not present

## 2022-02-27 DIAGNOSIS — M549 Dorsalgia, unspecified: Secondary | ICD-10-CM | POA: Diagnosis present

## 2022-02-27 DIAGNOSIS — F32A Depression, unspecified: Secondary | ICD-10-CM

## 2022-02-27 DIAGNOSIS — R531 Weakness: Secondary | ICD-10-CM | POA: Diagnosis not present

## 2022-02-27 DIAGNOSIS — I5021 Acute systolic (congestive) heart failure: Secondary | ICD-10-CM | POA: Diagnosis not present

## 2022-02-27 DIAGNOSIS — Z9221 Personal history of antineoplastic chemotherapy: Secondary | ICD-10-CM

## 2022-02-27 DIAGNOSIS — N170 Acute kidney failure with tubular necrosis: Secondary | ICD-10-CM | POA: Diagnosis not present

## 2022-02-27 DIAGNOSIS — R0600 Dyspnea, unspecified: Secondary | ICD-10-CM | POA: Diagnosis not present

## 2022-02-27 DIAGNOSIS — I10 Essential (primary) hypertension: Secondary | ICD-10-CM | POA: Diagnosis not present

## 2022-02-27 DIAGNOSIS — Z853 Personal history of malignant neoplasm of breast: Secondary | ICD-10-CM

## 2022-02-27 DIAGNOSIS — M50321 Other cervical disc degeneration at C4-C5 level: Secondary | ICD-10-CM | POA: Diagnosis not present

## 2022-02-27 DIAGNOSIS — E7800 Pure hypercholesterolemia, unspecified: Secondary | ICD-10-CM | POA: Diagnosis not present

## 2022-02-27 DIAGNOSIS — I252 Old myocardial infarction: Secondary | ICD-10-CM

## 2022-02-27 DIAGNOSIS — E785 Hyperlipidemia, unspecified: Secondary | ICD-10-CM | POA: Diagnosis present

## 2022-02-27 DIAGNOSIS — D72829 Elevated white blood cell count, unspecified: Secondary | ICD-10-CM | POA: Diagnosis not present

## 2022-02-27 DIAGNOSIS — R4781 Slurred speech: Secondary | ICD-10-CM | POA: Diagnosis not present

## 2022-02-27 DIAGNOSIS — D649 Anemia, unspecified: Secondary | ICD-10-CM

## 2022-02-27 DIAGNOSIS — K219 Gastro-esophageal reflux disease without esophagitis: Secondary | ICD-10-CM | POA: Diagnosis present

## 2022-02-27 DIAGNOSIS — Z9011 Acquired absence of right breast and nipple: Secondary | ICD-10-CM

## 2022-02-27 DIAGNOSIS — Z8611 Personal history of tuberculosis: Secondary | ICD-10-CM

## 2022-02-27 DIAGNOSIS — R7303 Prediabetes: Secondary | ICD-10-CM | POA: Diagnosis present

## 2022-02-27 DIAGNOSIS — Z923 Personal history of irradiation: Secondary | ICD-10-CM

## 2022-02-27 DIAGNOSIS — I480 Paroxysmal atrial fibrillation: Secondary | ICD-10-CM | POA: Diagnosis not present

## 2022-02-27 DIAGNOSIS — I4819 Other persistent atrial fibrillation: Secondary | ICD-10-CM | POA: Diagnosis not present

## 2022-02-27 DIAGNOSIS — Z79899 Other long term (current) drug therapy: Secondary | ICD-10-CM

## 2022-02-27 DIAGNOSIS — I5040 Unspecified combined systolic (congestive) and diastolic (congestive) heart failure: Secondary | ICD-10-CM | POA: Diagnosis not present

## 2022-02-27 DIAGNOSIS — J9690 Respiratory failure, unspecified, unspecified whether with hypoxia or hypercapnia: Secondary | ICD-10-CM | POA: Diagnosis not present

## 2022-02-27 DIAGNOSIS — Z7982 Long term (current) use of aspirin: Secondary | ICD-10-CM

## 2022-02-27 DIAGNOSIS — Z6841 Body Mass Index (BMI) 40.0 and over, adult: Secondary | ICD-10-CM

## 2022-02-27 DIAGNOSIS — E876 Hypokalemia: Secondary | ICD-10-CM | POA: Diagnosis not present

## 2022-02-27 DIAGNOSIS — D320 Benign neoplasm of cerebral meninges: Secondary | ICD-10-CM | POA: Diagnosis not present

## 2022-02-27 DIAGNOSIS — R5381 Other malaise: Secondary | ICD-10-CM | POA: Diagnosis present

## 2022-02-27 DIAGNOSIS — Z7984 Long term (current) use of oral hypoglycemic drugs: Secondary | ICD-10-CM

## 2022-02-27 DIAGNOSIS — M4312 Spondylolisthesis, cervical region: Secondary | ICD-10-CM | POA: Diagnosis not present

## 2022-02-27 DIAGNOSIS — N179 Acute kidney failure, unspecified: Secondary | ICD-10-CM | POA: Diagnosis not present

## 2022-02-27 DIAGNOSIS — J811 Chronic pulmonary edema: Secondary | ICD-10-CM | POA: Diagnosis not present

## 2022-02-27 LAB — DIFFERENTIAL
Abs Immature Granulocytes: 0.13 10*3/uL — ABNORMAL HIGH (ref 0.00–0.07)
Basophils Absolute: 0.1 10*3/uL (ref 0.0–0.1)
Basophils Relative: 0 %
Eosinophils Absolute: 0 10*3/uL (ref 0.0–0.5)
Eosinophils Relative: 0 %
Immature Granulocytes: 1 %
Lymphocytes Relative: 8 %
Lymphs Abs: 1.5 10*3/uL (ref 0.7–4.0)
Monocytes Absolute: 1.3 10*3/uL — ABNORMAL HIGH (ref 0.1–1.0)
Monocytes Relative: 6 %
Neutro Abs: 17.6 10*3/uL — ABNORMAL HIGH (ref 1.7–7.7)
Neutrophils Relative %: 85 %

## 2022-02-27 LAB — I-STAT CHEM 8, ED
BUN: 94 mg/dL — ABNORMAL HIGH (ref 8–23)
BUN: 105 mg/dL — ABNORMAL HIGH (ref 8–23)
Calcium, Ion: 1.04 mmol/L — ABNORMAL LOW (ref 1.15–1.40)
Calcium, Ion: 1.25 mmol/L (ref 1.15–1.40)
Chloride: 126 mmol/L — ABNORMAL HIGH (ref 98–111)
Chloride: 122 mmol/L — ABNORMAL HIGH (ref 98–111)
Creatinine, Ser: 7.6 mg/dL — ABNORMAL HIGH (ref 0.44–1.00)
Creatinine, Ser: 6.8 mg/dL — ABNORMAL HIGH (ref 0.44–1.00)
Glucose, Bld: 114 mg/dL — ABNORMAL HIGH (ref 70–99)
Glucose, Bld: 103 mg/dL — ABNORMAL HIGH (ref 70–99)
HCT: 31 % — ABNORMAL LOW (ref 36.0–46.0)
HCT: 28 % — ABNORMAL LOW (ref 36.0–46.0)
Hemoglobin: 10.5 g/dL — ABNORMAL LOW (ref 12.0–15.0)
Hemoglobin: 9.5 g/dL — ABNORMAL LOW (ref 12.0–15.0)
Potassium: 3.6 mmol/L (ref 3.5–5.1)
Potassium: 3.7 mmol/L (ref 3.5–5.1)
Sodium: 142 mmol/L (ref 135–145)
Sodium: 145 mmol/L (ref 135–145)
TCO2: 8 mmol/L — ABNORMAL LOW (ref 22–32)
TCO2: 11 mmol/L — ABNORMAL LOW (ref 22–32)

## 2022-02-27 LAB — CBC
HCT: 33.1 % — ABNORMAL LOW (ref 36.0–46.0)
HCT: 28.2 % — ABNORMAL LOW (ref 36.0–46.0)
Hemoglobin: 10.5 g/dL — ABNORMAL LOW (ref 12.0–15.0)
Hemoglobin: 9.5 g/dL — ABNORMAL LOW (ref 12.0–15.0)
MCH: 27.8 pg (ref 26.0–34.0)
MCH: 28.5 pg (ref 26.0–34.0)
MCHC: 31.7 g/dL (ref 30.0–36.0)
MCHC: 33.7 g/dL (ref 30.0–36.0)
MCV: 87.6 fL (ref 80.0–100.0)
MCV: 84.7 fL (ref 80.0–100.0)
Platelets: 259 10*3/uL (ref 150–400)
Platelets: 240 10*3/uL (ref 150–400)
RBC: 3.78 MIL/uL — ABNORMAL LOW (ref 3.87–5.11)
RBC: 3.33 MIL/uL — ABNORMAL LOW (ref 3.87–5.11)
RDW: 19.4 % — ABNORMAL HIGH (ref 11.5–15.5)
RDW: 18.6 % — ABNORMAL HIGH (ref 11.5–15.5)
WBC: 20.6 10*3/uL — ABNORMAL HIGH (ref 4.0–10.5)
WBC: 17.7 10*3/uL — ABNORMAL HIGH (ref 4.0–10.5)
nRBC: 0 % (ref 0.0–0.2)
nRBC: 0 % (ref 0.0–0.2)

## 2022-02-27 LAB — COMPREHENSIVE METABOLIC PANEL
ALT: 22 U/L (ref 0–44)
AST: 34 U/L (ref 15–41)
Albumin: 3.5 g/dL (ref 3.5–5.0)
Alkaline Phosphatase: 78 U/L (ref 38–126)
BUN: 97 mg/dL — ABNORMAL HIGH (ref 8–23)
CO2: <7 mmol/L — ABNORMAL LOW (ref 22–32)
Calcium: 9 mg/dL (ref 8.9–10.3)
Chloride: 116 mmol/L — ABNORMAL HIGH (ref 98–111)
Creatinine, Ser: 6.34 mg/dL — ABNORMAL HIGH (ref 0.44–1.00)
GFR, Estimated: 6 mL/min — ABNORMAL LOW (ref >60)
Glucose, Bld: 120 mg/dL — ABNORMAL HIGH (ref 70–99)
Potassium: 3.7 mmol/L (ref 3.5–5.1)
Sodium: 143 mmol/L (ref 135–145)
Total Bilirubin: 0.6 mg/dL (ref 0.3–1.2)
Total Protein: 7.2 g/dL (ref 6.5–8.1)

## 2022-02-27 LAB — I-STAT VENOUS BLOOD GAS, ED
Acid-base deficit: 20 mmol/L — ABNORMAL HIGH (ref 0.0–2.0)
Bicarbonate: 9.2 mmol/L — ABNORMAL LOW (ref 20.0–28.0)
Calcium, Ion: 1.27 mmol/L (ref 1.15–1.40)
HCT: 28 % — ABNORMAL LOW (ref 36.0–46.0)
Hemoglobin: 9.5 g/dL — ABNORMAL LOW (ref 12.0–15.0)
O2 Saturation: 95 %
Potassium: 3.8 mmol/L (ref 3.5–5.1)
Sodium: 146 mmol/L — ABNORMAL HIGH (ref 135–145)
TCO2: 10 mmol/L — ABNORMAL LOW (ref 22–32)
pCO2, Ven: 32.2 mmHg — ABNORMAL LOW (ref 44–60)
pH, Ven: 7.065 — CL (ref 7.25–7.43)
pO2, Ven: 104 mmHg — ABNORMAL HIGH (ref 32–45)

## 2022-02-27 LAB — HEMOGLOBIN A1C
Hgb A1c MFr Bld: 6.3 % — ABNORMAL HIGH (ref 4.8–5.6)
Mean Plasma Glucose: 134.11 mg/dL

## 2022-02-27 LAB — GLUCOSE, CAPILLARY
Glucose-Capillary: 103 mg/dL — ABNORMAL HIGH (ref 70–99)
Glucose-Capillary: 88 mg/dL (ref 70–99)

## 2022-02-27 LAB — BLOOD GAS, VENOUS
Acid-base deficit: 17.4 mmol/L — ABNORMAL HIGH (ref 0.0–2.0)
Bicarbonate: 9.3 mmol/L — ABNORMAL LOW (ref 20.0–28.0)
O2 Saturation: 32.6 %
Patient temperature: 36.6
pCO2, Ven: 25 mmHg — ABNORMAL LOW (ref 44–60)
pH, Ven: 7.19 — CL (ref 7.25–7.43)
pO2, Ven: <31 mmHg — CL (ref 32–45)

## 2022-02-27 LAB — MRSA NEXT GEN BY PCR, NASAL: MRSA by PCR Next Gen: NOT DETECTED

## 2022-02-27 LAB — RESP PANEL BY RT-PCR (FLU A&B, COVID) ARPGX2
Influenza A by PCR: NEGATIVE
Influenza B by PCR: NEGATIVE
SARS Coronavirus 2 by RT PCR: NEGATIVE

## 2022-02-27 LAB — TROPONIN I (HIGH SENSITIVITY)
Troponin I (High Sensitivity): 26 ng/L — ABNORMAL HIGH (ref <18)
Troponin I (High Sensitivity): 30 ng/L — ABNORMAL HIGH (ref <18)

## 2022-02-27 LAB — LACTIC ACID, PLASMA
Lactic Acid, Venous: 1.2 mmol/L (ref 0.5–1.9)
Lactic Acid, Venous: 1.7 mmol/L (ref 0.5–1.9)

## 2022-02-27 LAB — BRAIN NATRIURETIC PEPTIDE: B Natriuretic Peptide: 157.6 pg/mL — ABNORMAL HIGH (ref 0.0–100.0)

## 2022-02-27 LAB — APTT: aPTT: 25 seconds (ref 24–36)

## 2022-02-27 LAB — PROTIME-INR
INR: 1.1 (ref 0.8–1.2)
Prothrombin Time: 14.4 seconds (ref 11.4–15.2)

## 2022-02-27 LAB — CK: Total CK: 1062 U/L — ABNORMAL HIGH (ref 38–234)

## 2022-02-27 LAB — PROCALCITONIN: Procalcitonin: 0.8 ng/mL

## 2022-02-27 LAB — ETHANOL: Alcohol, Ethyl (B): <10 mg/dL (ref <10)

## 2022-02-27 MED ORDER — HEPARIN SODIUM (PORCINE) 5000 UNIT/ML IJ SOLN
5000.0000 [IU] | Freq: Three times a day (TID) | INTRAMUSCULAR | Status: DC
  Administered 2022-02-28 – 2022-03-03 (×10): 5000 [IU] via SUBCUTANEOUS
  Filled 2022-02-27 (×10): qty 1

## 2022-02-27 MED ORDER — CHLORHEXIDINE GLUCONATE CLOTH 2 % EX PADS
6.0000 | MEDICATED_PAD | Freq: Every day | CUTANEOUS | Status: DC
  Administered 2022-02-27 – 2022-03-04 (×5): 6 via TOPICAL

## 2022-02-27 MED ORDER — MONTELUKAST SODIUM 10 MG PO TABS
10.0000 mg | ORAL_TABLET | Freq: Every day | ORAL | Status: DC
  Administered 2022-02-28 – 2022-03-12 (×14): 10 mg via ORAL
  Filled 2022-02-27 (×14): qty 1

## 2022-02-27 MED ORDER — LACTATED RINGERS IV BOLUS
1000.0000 mL | Freq: Once | INTRAVENOUS | Status: AC
  Administered 2022-02-27: 1000 mL via INTRAVENOUS

## 2022-02-27 MED ORDER — DEXAMETHASONE SODIUM PHOSPHATE 10 MG/ML IJ SOLN
10.0000 mg | Freq: Once | INTRAMUSCULAR | Status: AC
  Administered 2022-02-27: 10 mg via INTRAVENOUS
  Filled 2022-02-27: qty 1

## 2022-02-27 MED ORDER — POLYETHYLENE GLYCOL 3350 17 G PO PACK: 17.0000 g | PACK | Freq: Every day | ORAL | Status: DC | PRN

## 2022-02-27 MED ORDER — SODIUM BICARBONATE 8.4 % IV SOLN
50.0000 meq | Freq: Once | INTRAVENOUS | Status: AC
  Administered 2022-02-27: 50 meq via INTRAVENOUS
  Filled 2022-02-27: qty 50

## 2022-02-27 MED ORDER — DEXAMETHASONE SODIUM PHOSPHATE 4 MG/ML IJ SOLN
4.0000 mg | Freq: Four times a day (QID) | INTRAMUSCULAR | Status: DC
Start: 2022-02-28 — End: 2022-03-06
  Administered 2022-02-28 – 2022-03-06 (×27): 4 mg via INTRAVENOUS
  Filled 2022-02-27 (×29): qty 1

## 2022-02-27 MED ORDER — DEXAMETHASONE SODIUM PHOSPHATE 4 MG/ML IJ SOLN
4.0000 mg | Freq: Four times a day (QID) | INTRAMUSCULAR | Status: DC
Start: 2022-02-27 — End: 2022-02-27

## 2022-02-27 MED ORDER — LIDOCAINE 5 % EX PTCH
1.0000 | MEDICATED_PATCH | CUTANEOUS | Status: DC
  Administered 2022-03-02 – 2022-03-13 (×10): 1 via TRANSDERMAL
  Filled 2022-02-27 (×11): qty 1

## 2022-02-27 MED ORDER — ACETAMINOPHEN 500 MG PO TABS
500.0000 mg | ORAL_TABLET | Freq: Three times a day (TID) | ORAL | Status: DC
  Administered 2022-02-28 – 2022-03-01 (×5): 500 mg via ORAL
  Filled 2022-02-27 (×5): qty 1

## 2022-02-27 MED ORDER — LACTATED RINGERS IV SOLN: INTRAVENOUS | Status: DC

## 2022-02-27 MED ORDER — LORAZEPAM 1 MG PO TABS
0.5000 mg | ORAL_TABLET | Freq: Once | ORAL | Status: AC
  Administered 2022-02-27: 0.5 mg via ORAL
  Filled 2022-02-27: qty 1

## 2022-02-27 MED ORDER — INSULIN ASPART 100 UNIT/ML IJ SOLN
0.0000 [IU] | INTRAMUSCULAR | Status: DC
  Administered 2022-02-28: 2 [IU] via SUBCUTANEOUS
  Administered 2022-02-28: 5 [IU] via SUBCUTANEOUS
  Administered 2022-02-28: 1 [IU] via SUBCUTANEOUS
  Administered 2022-02-28: 3 [IU] via SUBCUTANEOUS
  Administered 2022-03-01 (×3): 1 [IU] via SUBCUTANEOUS
  Administered 2022-03-01: 2 [IU] via SUBCUTANEOUS
  Administered 2022-03-01 – 2022-03-04 (×13): 1 [IU] via SUBCUTANEOUS
  Administered 2022-03-04: 2 [IU] via SUBCUTANEOUS
  Administered 2022-03-05 (×3): 1 [IU] via SUBCUTANEOUS
  Administered 2022-03-05 – 2022-03-06 (×2): 2 [IU] via SUBCUTANEOUS
  Administered 2022-03-06: 1 [IU] via SUBCUTANEOUS
  Administered 2022-03-06: 2 [IU] via SUBCUTANEOUS
  Administered 2022-03-06 – 2022-03-07 (×2): 1 [IU] via SUBCUTANEOUS
  Administered 2022-03-07: 2 [IU] via SUBCUTANEOUS
  Administered 2022-03-08 (×2): 1 [IU] via SUBCUTANEOUS

## 2022-02-27 MED ORDER — ONDANSETRON HCL 4 MG/2ML IJ SOLN
4.0000 mg | Freq: Three times a day (TID) | INTRAMUSCULAR | Status: DC | PRN
  Administered 2022-02-28 – 2022-03-08 (×5): 4 mg via INTRAVENOUS
  Filled 2022-02-27 (×6): qty 2

## 2022-02-27 MED ORDER — DOCUSATE SODIUM 100 MG PO CAPS
100.0000 mg | ORAL_CAPSULE | Freq: Two times a day (BID) | ORAL | Status: DC | PRN
  Administered 2022-03-01: 100 mg via ORAL
  Filled 2022-02-27: qty 1

## 2022-02-27 MED ORDER — STERILE WATER FOR INJECTION IV SOLN
INTRAVENOUS | Status: DC
  Filled 2022-02-27 (×4): qty 1000

## 2022-02-27 NOTE — Progress Notes (Signed)
No appropriate vein for IV start noted even with [place] on right arm, left arm restricted. RN made aware.

## 2022-02-27 NOTE — Assessment & Plan Note (Signed)
History of anemia and at her baseline Continue to monitor

## 2022-02-27 NOTE — Assessment & Plan Note (Signed)
Continue crestor 

## 2022-02-27 NOTE — H&P (Signed)
History and physical   Patient: Tara [name] STM:[id number] DOB: [birth year] DOA: 02/27/2022 DOS: the patient was seen and examined on 02/27/2022 PCP: [name], MD  Patient coming from: Home - lives with her husband. Ambulates independently    Chief Complaint: weakness, slurred speech, confusion   HPI: Tara [name] is a 76 y.o. female with medical history significant of depression/anxiety, hx of breast cancer, HTN, HLD, meningioma, systolic CHF who presented with 2 week progressive decline. She started to have some decreased energy, some intermittent confusion and slurred speech. She has also had some shortness of breath that has been going on x 2 months.  Her daughter noticed some slurred speech 2 days ago. Yesterday her slurred speech and confusion worsened. She didn't want to come to hospital.  Today she was found on the floor and was alert. She states she tried to get in bed, but her husband didn't slide over and she fell off the bed. She was not able to get back up due to weakness. She has had some random emesis that started about 1-2 weeks ago and she had one episode of vomiting yesterday and 3 episodes today.  She denies any abdominal pain or diarrhea. She has history of constipation/hemorrhoids.   -she has been taking '400mg'$  of motrin daily for at least 6 weeks. No blood in her vomit.  -she was also started on jardiance 2 months ago. Also takes entresto, spironolactone   Denies any fever/chills, vision changes/headaches, chest pain or palpitations, cough, abdominal pain, diarrhea, dysuria or leg swelling. ? Orthopnea, has been eating more salted foods. +weight gain.    She does not smoke or drink alcohol   ER Course:  vitals: afebrile, BP: 91/51, HR: 64, RR: 18, oxygen: 94% RA Pertinent labs: WBC: 20.6, hgb ;10.5, CO2<7, BUN: 97, creatinine: 6.34,  CT head: Interval increase in the size of a previously noted planum sphenoidale meningioma, with interval development of  hypodensity in the inferior left frontal lobe, favored to represent sequela of remote infarct but possibly also representing edema related to the mass. An MRI with and without contrast is recommended for further evaluation. No acute infarct CT cervical spine: no acute fracture. Moderate cervical spondylosis most prominent at C4-C5 with minimal retrolisthesis, prominent marginal osteophytes and decrease in the height of the disc space. In ED: NS consulted. Patient given decadron, 1L bolus and ativan. Nephrology consulted.     Review of Systems: As mentioned in the history of present illness. All other systems reviewed and are negative. 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   Anxiety    Breast cancer ([place]) 2008   right - s/p lumpectomy->chemo, radiation   Depression    Dysrhythmia    hx SVT   Family history of colon cancer    Family history of prostate cancer    GERD (gastroesophageal reflux disease)    H/O: hysterectomy    Hiatal hernia    History of cancer chemotherapy    History of radiation therapy    Hyperlipidemia    Hypertension 01/25/2018   Nonischemic cardiomyopathy ([place])    Personal history of radiation therapy 01/05/209   SVT (supraventricular tachycardia) (HCC)    short RP SVT documented 8/92   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[place])    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)   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   Past Surgical History:  Procedure Laterality Date   ABDOMINAL HYSTERECTOMY  1994   fibroids,    BIOPSY  06/19/2016   Procedure: BIOPSY;  Surgeon: Daneil Dolin, MD;  [facility];  Service: Endoscopy;;  gastric duodenum   BREAST BIOPSY Right 12/16/2006   malignant   BREAST EXCISIONAL BIOPSY Right 2018   benign lumpectomy   BREAST LUMPECTOMY Right 02/2007   BREAST LUMPECTOMY WITH RADIOACTIVE SEED LOCALIZATION Right  07/29/2017   Procedure: RIGHT BREAST LUMPECTOMY WITH RADIOACTIVE SEED LOCALIZATION;  Surgeon: Fanny Skates, MD;  Location: [facility];  Service: General;  Laterality: Right;   BREAST SURGERY Right 2008   lumpectomy, cancer   CARDIAC CATHETERIZATION     CHOLECYSTECTOMY  1999   COLONOSCOPY  2008   Dr. Oneida Alar: multiple polyps. Path not available at time of visit.    COLONOSCOPY WITH PROPOFOL N/A 04/25/2014   Dr. Oneida Alar: Multiple tubular adenomas removed. Diverticulosis. Moderate internal hemorrhoids. Next colonoscopy planned for August 2018.   ESOPHAGOGASTRODUODENOSCOPY (EGD) WITH PROPOFOL N/A 06/19/2016   Procedure: ESOPHAGOGASTRODUODENOSCOPY (EGD) WITH PROPOFOL;  Surgeon: Daneil Dolin, MD;  [facility];  Service: Endoscopy;  Laterality: N/A;   MASTECTOMY W/ SENTINEL NODE BIOPSY Right 06/09/2018   Procedure: RIGHT TOTAL MASTECTOMY WITH SENTINEL LYMPH NODE BIOPSY;  Surgeon: Fanny Skates, MD;  Location: [facility];  Service: General;  Laterality: Right;   POLYPECTOMY N/A 04/25/2014   Procedure: POLYPECTOMY;  Surgeon: Danie Binder, MD;  Location: [facility];  Service: Endoscopy;  Laterality: N/A;  Ascending and Decending Colon x3 , Transverse colon x2, rectal   PORTACATH PLACEMENT N/A 06/09/2018   Procedure: INSERTION PORT-A-CATH;  Surgeon: Fanny Skates, MD;  Location: [facility];  Service: General;  Laterality: N/A;   RIGHT/LEFT HEART CATH AND CORONARY ANGIOGRAPHY N/A 07/10/2017   Procedure: RIGHT/LEFT HEART CATH AND CORONARY ANGIOGRAPHY;  Surgeon: Larey Dresser, MD;  Location: [facility];  Service: Cardiovascular;  Laterality: N/A;   Social History:  reports that she has never smoked. She has never used smokeless tobacco. She reports that she does not drink alcohol and does not use drugs.  Allergies  Allergen Reactions   Aspirin Other (See Comments)    Reports GI bleed--pt is currently taking   Lisinopril Cough   Sudafed  [Pseudoephedrine Hcl] Other (See Comments)    Sore throat    Sudafed [Pseudoephedrine Hcl]     Pt reports she had drainage in throat that made her throat hurt.     Family History  Problem Relation Age of Onset   Colon cancer Father 72       died at age 38   Hypertension Sister    Heart disease Sister    Cancer Sister        unknown form   Dementia Mother    Stroke Mother 87       left hemiparesis   Cancer Brother 45       prostate   Cancer Paternal Aunt        NOS   Lung cancer Paternal Uncle    Other Paternal Uncle        lightning strike   Other Paternal Uncle        hit by train   Other Paternal 37        old age   Cancer Cousin        NOS pat first cousin   Cancer Cousin  NOS pat first cousin   Prostate cancer Cousin        pat first cousin    Prior to Admission medications   Medication Sig Start Date End Date Taking? Authorizing Provider  acetaminophen (TYLENOL) 500 MG tablet Take 500-1,000 mg by mouth every 6 (six) hours as needed (FOR PAIN.).    [provider]  aspirin EC 81 MG tablet Take 81 mg by mouth daily.     [provider]  azelastine (ASTELIN) 0.1 % nasal spray Place into both nostrils as needed for rhinitis. Use in each nostril as directed    [provider]  clonazePAM (KLONOPIN) 0.5 MG tablet Take 1 tablet (0.5 mg total) by mouth at bedtime. 01/20/22   [name], MD  docusate sodium (COLACE) 100 MG capsule Take 200 mg by mouth 2 (two) times daily.     [provider]  hydrocortisone (ANUSOL-HC) 2.5 % rectal cream APPLY RECTALLY TWICE A DAY. 03/26/21   [name], MD  hydrocortisone (PROCTOZONE-HC) 2.5 % rectal cream Place 1 application rectally 2 (two) times daily. 10/09/21   [name], MD  hydrocortisone 2.5 % cream APPLY TO THE AFFECTED AREA(S) TWICE A DAY. 09/27/21   [name], MD  JARDIANCE 10 MG TABS tablet TAKE ONE TABLET BY MOUTH ONCE DAILY. 01/03/22   Larey Dresser, MD  metoCLOPramide (REGLAN) 10 MG tablet TAKE ONE TABLET BY  MOUTH TWICE DAILY. 02/24/22   [name], MD  metoprolol succinate (TOPROL-XL) 50 MG 24 hr tablet Take 1 tablet (50 mg total) by mouth 2 (two) times daily. 01/29/22   Larey Dresser, MD  montelukast (SINGULAIR) 10 MG tablet TAKE ONE TABLET BY MOUTH AT BEDTIME. 09/03/21   [name], MD  pantoprazole (PROTONIX) 40 MG tablet TAKE (1) TABLET BY MOUTH TWICE DAILY. 11/18/21   [name], MD  rosuvastatin (CRESTOR) 20 MG tablet Take 1 tablet (20 mg total) by mouth daily. 01/14/22   [name], MD  sacubitril-valsartan (ENTRESTO) 24-26 MG Take 1 tablet by mouth 2 (two) times daily. 04/05/21   Larey Dresser, MD  sertraline (ZOLOFT) 25 MG tablet Take 1 tablet (25 mg total) by mouth daily. 01/20/22   [name], MD  spironolactone (ALDACTONE) 25 MG tablet TAKE 1 TABLET BY MOUTH ONCE A DAY. NEEDS APPOINTMENT FOR FUTURE FILLS. 02/10/22   Larey Dresser, MD  UNABLE TO FIND Cranial prothesis  DX C50.911 L65.9 11/01/21   [name], MD    Physical Exam: Vitals:   02/27/22 1426 02/27/22 1951  BP: (!) 91/51 111/74  Pulse: 65 61  Resp: 18 16  Temp: 98.1 F (36.7 C)   TempSrc: Oral   SpO2: 94% 98%   General:  Appears calm and comfortable and is in NAD Eyes:  PERRL, EOMI, normal lids, iris ENT:  grossly normal hearing, lips & tongue, mmm; appropriate dentition Neck:  no LAD, masses or thyromegaly; no carotid bruits no JVD Cardiovascular:  RRR, no m/r/g. No LE edema.  Respiratory:   CTA bilaterally with no wheezes/rales/rhonchi.  Normal respiratory effort. Abdomen:  soft, NT, ND, NABS Back:   normal alignment, no CVAT Skin:  no rash or induration seen on limited exam Musculoskeletal:  normal BUE. Weakness in BLE: 2/5. Can not lift against gravity. no bony abnormality Lower extremity:  No LE edema.  Limited foot exam with no ulcerations.  2+ distal pulses. Psychiatric:  grossly normal mood and affect, speech fluent  and appropriate, AOx3 Neurologic:  CN  2-12 grossly intact, moves all extremities in coordinated fashion, sensation intact   Radiological Exams on Admission: Independently reviewed - see discussion in A/P where applicable  US RENAL  Result Date: 02/27/2022 CLINICAL DATA:  Acute kidney injury EXAM: RENAL / URINARY TRACT ULTRASOUND COMPLETE COMPARISON:  None Available. FINDINGS: Right Kidney: Renal measurements: 8.9 x 3.8 x 3.6 cm = volume: 62.86 mL. Mildly increased renal cortical echogenicity. No mass or hydronephrosis visualized. Left Kidney: Renal measurements: 7.8 x 5.1 x 4.3 cm = volume: 87.75 mL. Mildly increased renal cortical echogenicity. No mass or hydronephrosis visualized. Bladder: Appears normal for degree of bladder distention. Other: None. IMPRESSION: No hydronephrosis.  Findings of medical renal disease. Electronically Signed   By: Maurine Simmering M.D.   On: 02/27/2022 18:44   DG Chest 2 View  Result Date: 02/27/2022 CLINICAL DATA:  Dyspnea on exertion. EXAM: CHEST - 2 VIEW COMPARISON:  Chest x-ray 06/09/2018. FINDINGS: Heart is mildly enlarged, unchanged. Calcified right mediastinal and right hilar lymph nodes are again seen. There is mild central pulmonary vascular congestion. There is no focal lung infiltrate, pleural effusion or pneumothorax. No acute fractures are seen. Right axillary surgical clips are present. IMPRESSION: 1. No acute cardiopulmonary process. 2. Mild cardiomegaly. 3. Old granulomatous disease. Electronically Signed   By: Ronney Asters M.D.   On: 02/27/2022 18:07   CT HEAD WO CONTRAST  Result Date: 02/27/2022 CLINICAL DATA:  Slurred speech weakness EXAM: CT HEAD WITHOUT CONTRAST TECHNIQUE: Contiguous axial images were obtained from the base of the skull through the vertex without intravenous contrast. RADIATION DOSE REDUCTION: This exam was performed according to the departmental dose-optimization program which includes automated exposure control, adjustment of the mA and/or kV according to patient size  and/or use of iterative reconstruction technique. COMPARISON:  09/28/2016 FINDINGS: Brain: Redemonstrated planum sphenoidale meningioma, which is not well evaluated given the absence of intravenous contrast, which measures approximately 3.3 x 2.9 x 2.4 cm cm (AP x TR x CC) (series 6, image 30 and series 5, image 21), previously 3.4 x 2.5 x 2.1 cm on 09/28/2016 when remeasured similarly. Hypodensity in the adjacent left frontal lobe (series 5, image 17 and series 3, image 12), which is favored to represent the sequela of remote infarct but could also represent edema associated with the mass. No evidence of acute infarction, hemorrhage, or midline shift. No hydrocephalus or extra-axial fluid collection. Vascular: No hyperdense vessel. Atherosclerotic calcifications in the intracranial carotid and vertebral arteries. Skull: Normal. Negative for fracture or focal lesion. Sinuses/Orbits: Mucosal thickening in the maxillary sinuses. The orbits are unremarkable. Other: The mastoid air cells are well aerated. IMPRESSION: 1. Interval increase in the size of a previously noted planum sphenoidale meningioma, with interval development of hypodensity in the inferior left frontal lobe, favored to represent sequela of remote infarct but possibly also representing edema related to the mass. An MRI with and without contrast is recommended for further evaluation. 2. No evidence of acute infarct. These results were called by telephone at the time of interpretation on 02/27/2022 at 3:51 pm to provider [place] , who verbally acknowledged these results. Electronically Signed   By: Merilyn Baba M.D.   On: 02/27/2022 15:51   CT Cervical Spine Wo Contrast  Result Date: 02/27/2022 CLINICAL DATA:  Neck trauma, after a fall off the bed) EXAM: CT CERVICAL SPINE WITHOUT CONTRAST TECHNIQUE: Multidetector CT imaging of the cervical spine was performed without intravenous contrast. Multiplanar CT image reconstructions  were also generated.  RADIATION DOSE REDUCTION: This exam was performed according to the departmental dose-optimization program which includes automated exposure control, adjustment of the mA and/or kV according to patient size and/or use of iterative reconstruction technique. COMPARISON:  None Available. FINDINGS: Alignment: Minimal retrolisthesis at C4-C5. Skull base and vertebrae: No acute fracture. No primary bone lesion or focal pathologic process. Moderate cervical spondylosis with prominent marginal osteophytes, most severe at C4-C5. Posterior osteophyte ridge causes abutment of the ventral cervical cord. Soft tissues and spinal canal: No prevertebral fluid or swelling. No visible canal hematoma. Disc levels:  There is some narrowing of the disc space at C4-C5. Upper chest: Negative. Other: None. IMPRESSION: No fracture, subluxation or dislocation. Moderate cervical spondylosis most prominent at C4-C5 with minimal retrolisthesis, prominent marginal osteophytes and decrease in the height of the disc space. Electronically Signed   By: Frazier Richards M.D.   On: 02/27/2022 15:41    EKG: Independently reviewed.  NSR with rate 65; nonspecific ST changes with no evidence of acute ischemia   Labs on Admission: I have personally reviewed the available labs and imaging studies at the time of the admission.  Pertinent labs:   WBC: 20.6,  hgb ;10.5,  CO2<7,  BUN: 97,  creatinine: 6.34,   Assessment and Plan: Active Problems:   Acute renal failure superimposed on stage 3b chronic kidney disease (HCC)   Lactic acidosis   Meningioma, cerebral (HCC)   Leukocytosis   Chronic systolic CHF (congestive heart failure) (HCC)   Hypertension   Normocytic anemia   Anxiety and depression   GERD   Hyperlipidemia    Assessment and Plan: Acute renal failure superimposed on stage 3b chronic kidney disease ([place]) 76 year old presenting with 2 week history of general fatigue, weakness, confusion, vomiting found to have acute on  chronic renal failure with lactic acidosis.  Baseline creatinine of 1.4-1.5 Likely combination of hypotension and intra renal insult with nephrotoxic drugs including daily NSAID use Renal ultrasound with no obstruction and medical renal disease  Strict I/O UA pending Hold nephrotoxic drugs Check urine studies Nephrology consulted    Meningioma, cerebral (HCC) Known history of meningioma with no follow up CT now shows interval increase in size with interval development of hypodensity in the inferior left frontal lobe, favored to be remote infarct but possibly edema related to the mass.  ? If symptoms of confusion slurred speech are related to uremia vs. Lactic acidosis vs. Mass vs combo Neurosurgery consulted in ED and would like MRI with and without contrast, but renal function needs to improve Recommended decadron '4mg'$  q 6 hours Would operate next week, but renal function needs to improve  Neuro checks and seizure precautions   Lactic acidosis Initial CO2 <7 likely from acute on chronic kidney failure  Ordered lactic acid and vbg Nephrology starting bicarb She is not short of breath or tachypneic  vbg resulted with pH of 7.0 and ICU will be admitting Discussed with her possible need for dialysis and she is okay with this temporarily   Leukocytosis No source of infection, no fevers ? Reactive Trend fever curve and cbc Will be elevated with steroids.   Chronic systolic CHF (congestive heart failure) (HCC) Appears euvolemic, but does have history of weight gain/shortness of breath but has been going on x 2 months Last echo: 7/22 with EF of 55% with normal LVF. Normal diastolic function.  Strict I/O and daily weights Holding entresto, aldactone and jardiance with AKI Holding toprol with hypotension  Hypertension Soft/low blood pressures likely contributing to acute renal injury  Hold anti hypertensive meds at this time   Normocytic anemia History of anemia and at her  baseline Continue to monitor   Anxiety and depression Continue zoloft and ativan   GERD Continue protonix   Hyperlipidemia Continue crestor     Advance Care Planning:   Code Status: Full Code   Consults: nephrology Dr. Carolin Sicks and neurosurgery: Dr.Mcdaniel  ICU: will now be admitting   DVT Prophylaxis: SCDs.  Family Communication: husband and daughter at bedside: Clorissa Gruenberg and [name] fountain   Severity of Illness: The appropriate patient status for this patient is INPATIENT. Inpatient status is judged to be reasonable and necessary in order to provide the required intensity of service to ensure the patient's safety. The patient's presenting symptoms, physical exam findings, and initial radiographic and laboratory data in the context of their chronic comorbidities is felt to place them at high risk for further clinical deterioration. Furthermore, it is not anticipated that the patient will be medically stable for discharge from the hospital within 2 midnights of admission.   * I certify that at the point of admission it is my clinical judgment that the patient will require inpatient hospital care spanning beyond 2 midnights from the point of admission due to high intensity of service, high risk for further deterioration and high frequency of surveillance required.*  Author: Orma Flaming, MD 02/27/2022 8:04 PM  For on call review [email or web address].

## 2022-02-27 NOTE — Assessment & Plan Note (Signed)
Soft/low blood pressures likely contributing to acute renal injury  Hold anti hypertensive meds at this time

## 2022-02-27 NOTE — Progress Notes (Signed)
[facility] Progress Note Patient Name: Tara [name] DOB: [birth year] MRN: [id number]   Date of Service  02/27/2022  HPI/Events of Note  Multiple issues: 1. VBG on RA = 7.19/25/<31/9.3. Patient is already on a NaHCO3 IV infusion and pH has improved from 7.065. 2. Nursing request for cental venous access.   eICU Interventions  Plan: Continue present management.  Will notify PCCM ground team of nursing request for central venous access.      Intervention Category Major Interventions: Acid-Base disturbance - evaluation and management  Zebulin Siegel [place] 02/27/2022, 11:03 PM

## 2022-02-27 NOTE — Assessment & Plan Note (Addendum)
76 year old presenting with 2 week history of general fatigue, weakness, confusion, vomiting found to have acute on chronic renal failure with lactic acidosis.  Baseline creatinine of 1.4-1.5 Likely combination of hypotension and intra renal insult with nephrotoxic drugs including daily NSAID use Renal ultrasound with no obstruction and medical renal disease  Strict I/O UA pending Hold nephrotoxic drugs Check urine studies Nephrology consulted

## 2022-02-27 NOTE — Assessment & Plan Note (Signed)
Appears euvolemic, but does have history of weight gain/shortness of breath but has been going on x 2 months Last echo: 7/22 with EF of 55% with normal LVF. Normal diastolic function.  Strict I/O and daily weights Holding entresto, aldactone and jardiance with AKI Holding toprol with hypotension

## 2022-02-27 NOTE — Assessment & Plan Note (Addendum)
Known history of meningioma with no follow up CT now shows interval increase in size with interval development of hypodensity in the inferior left frontal lobe, favored to be remote infarct but possibly edema related to the mass.  ? If symptoms of confusion slurred speech are related to uremia vs. Lactic acidosis vs. Mass vs combo Neurosurgery consulted in ED and would like MRI with and without contrast, but renal function needs to improve Recommended decadron '4mg'$  q 6 hours Would operate next week, but renal function needs to improve  Neuro checks and seizure precautions

## 2022-02-27 NOTE — Progress Notes (Signed)
PCCM Interval Progress Note  Ground team contacted by [facility] as nursing has requested central venous access for patient, as IV team unsuccessful.   Evaluated patient at bedside. Discussed with nursing, patient currently has a functioning PIV with only continuous infusion bicarbonate at present. Goal to see what patient's renal function looks like in the morning; at that time will decide if patient needs Trialysis catheter.   Potential access points already limited and nursing is comfortable with 1 PIV for now. RN will reach back out if access needs change.  Lestine Mount, PA-C [facility] 02/27/22 11:54 PM  Please see [email or web address] for pager details.  From 7A-7P if no response, please call [phone] After hours, please call [facility] [phone]

## 2022-02-27 NOTE — ED Provider Notes (Signed)
[facility] Provider Note  CSN: [id number] Arrival date & time: 02/27/22 1414  Chief Complaint(s) Aphasia  HPI Tara [name] is a 76 y.o. female with PMH breast cancer status post chemo, radiation and lumpectomy, SVT, CHF, HTN, HLD, meningioma who presents to the emergency department for evaluation of multiple complaints including generalized weakness, slurred speech, fatigue.  Family states that the symptoms have been progressively worsening over the last 2 weeks but this morning patient apparently fell about overnight and was unable to get back into bed.  Found on the floor early this morning.  Family states that the patient does have known history of a meningioma but was initially not interested in pursuing surgical treatment.  She has had increased vomiting as of recently as well.  On arrival, patient with minimal slurred speech but no focal motor deficits.  Patient and family deny fever, chest pain, abdominal pain or other systemic symptoms.   Past Medical History 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   Anxiety    Breast cancer ([place]) 2008   right - s/p lumpectomy->chemo, radiation   Depression    Dysrhythmia    hx SVT   Family history of colon cancer    Family history of prostate cancer    GERD (gastroesophageal reflux disease)    H/O: hysterectomy    Hiatal hernia    History of cancer chemotherapy    History of radiation therapy    Hyperlipidemia    Hypertension 01/25/2018   Nonischemic cardiomyopathy ([place])    Personal history of radiation therapy 01/05/209   SVT (supraventricular tachycardia) ([place])    short RP SVT documented 8/52   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[place])    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)   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   Patient Active Problem List   Diagnosis Date Noted   Lactic acidosis  02/27/2022   Acute renal failure superimposed on stage 3b chronic kidney disease ([place]) 02/27/2022   Anxiety and depression 02/27/2022   Meningioma, cerebral ([place]) 02/27/2022   Normocytic anemia 02/27/2022   Acute renal failure (ARF) ([place]) 02/27/2022   Hemorrhoids 01/04/2021   Ankle deformity, right 09/12/2020   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   Port-A-Cath in place 09/28/2018   Colon adenomas 08/06/2018   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 ([place]) 06/09/2018   Hypertension 01/25/2018   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   Demand ischemia (HCC)    Prolonged Q-T interval on ECG 09/29/2016   Chronic kidney disease (CKD), stage III (moderate) (HCC) 09/29/2016   Leukocytosis 09/18/2016   Abnormal CT scan, chest    Upper airway cough syndrome  vs Cough variant asthma  04/09/2016   Seasonal allergies 08/08/2014   Family history of colon cancer 03/30/2014   Osteoporosis 77/82/4235   Metabolic syndrome X 36/14/4315   SVT (supraventricular tachycardia) ([place]) 40/04/6760   Chronic systolic CHF (congestive heart failure) ([place]) 03/14/2013   Vitamin D deficiency 07/23/2010   Morbid obesity ([place]) 07/04/2009   Malignant neoplasm of right breast ([place]) 12/13/2007   Hyperlipidemia 12/13/2007   Anxiety state 12/13/2007   Depression, major, single episode, in partial remission ([place]) 12/13/2007   GERD 12/13/2007   Home Medication(s) Prior to Admission medications   Medication Sig Start Date End Date Taking? Authorizing Provider  acetaminophen (TYLENOL)  500 MG tablet Take 500-1,000 mg by mouth every 6 (six) hours as needed (FOR PAIN.).   Yes [provider]  aspirin EC 81 MG tablet Take 81 mg by mouth daily.    Yes [provider]  azelastine (ASTELIN) 0.1 % nasal spray Place 1 spray into both nostrils daily as needed for rhinitis. Use in each nostril as directed   Yes [provider]  Cholecalciferol (VITAMIN D) 50 MCG (2000 UT) CAPS  Take 2,000 Units by mouth daily.   Yes [provider]  clonazePAM (KLONOPIN) 0.5 MG tablet Take 1 tablet (0.5 mg total) by mouth at bedtime. 01/20/22  Yes [name], MD  docusate sodium (COLACE) 100 MG capsule Take 200 mg by mouth 2 (two) times daily.    Yes [provider]  JARDIANCE 10 MG TABS tablet TAKE ONE TABLET BY MOUTH ONCE DAILY. Patient taking differently: Take 10 mg by mouth daily. 01/03/22  Yes Larey Dresser, MD  metoCLOPramide (REGLAN) 10 MG tablet TAKE ONE TABLET BY MOUTH TWICE DAILY. Patient taking differently: Take 10 mg by mouth 2 (two) times daily. 02/24/22  Yes [name], MD  metoprolol succinate (TOPROL-XL) 50 MG 24 hr tablet Take 1 tablet (50 mg total) by mouth 2 (two) times daily. 01/29/22  Yes Larey Dresser, MD  montelukast (SINGULAIR) 10 MG tablet TAKE ONE TABLET BY MOUTH AT BEDTIME. Patient taking differently: Take 10 mg by mouth at bedtime. 09/03/21  Yes [name], MD  pantoprazole (PROTONIX) 40 MG tablet TAKE (1) TABLET BY MOUTH TWICE DAILY. Patient taking differently: 40 mg 2 (two) times daily. 11/18/21  Yes [name], MD  Phenylephrine-Witch Hazel (PREPARATION H) 0.25-50 % GEL Apply 1 Application topically daily as needed (hemorrhoid).   Yes [provider]  rosuvastatin (CRESTOR) 20 MG tablet Take 1 tablet (20 mg total) by mouth daily. 01/14/22  Yes [name], MD  sacubitril-valsartan (ENTRESTO) 24-26 MG Take 1 tablet by mouth 2 (two) times daily. 04/05/21  Yes Larey Dresser, MD  sertraline (ZOLOFT) 25 MG tablet Take 1 tablet (25 mg total) by mouth daily. 01/20/22  Yes [name], MD  spironolactone (ALDACTONE) 25 MG tablet TAKE 1 TABLET BY MOUTH ONCE A DAY. NEEDS APPOINTMENT FOR FUTURE FILLS. Patient taking differently: Take 25 mg by mouth daily. 02/10/22  Yes Larey Dresser, MD  hydrocortisone (ANUSOL-HC) 2.5 % rectal cream APPLY RECTALLY TWICE A DAY. Patient not taking: Reported  on 02/27/2022 03/26/21   [name], MD  hydrocortisone (PROCTOZONE-HC) 2.5 % rectal cream Place 1 application rectally 2 (two) times daily. Patient not taking: Reported on 02/27/2022 10/09/21   [name], MD  hydrocortisone 2.5 % cream APPLY TO THE AFFECTED AREA(S) TWICE A DAY. Patient not taking: Reported on 02/27/2022 09/27/21   [name], MD  UNABLE TO FIND Cranial prothesis  DX [phone] L65.9 Patient not taking: Reported on 02/27/2022 11/01/21   [name], MD  Past Surgical History Past Surgical History:  Procedure Laterality Date   ABDOMINAL HYSTERECTOMY  1994   fibroids,    BIOPSY  06/19/2016   Procedure: BIOPSY;  Surgeon: Daneil Dolin, MD;  [facility];  Service: Endoscopy;;  gastric duodenum   BREAST BIOPSY Right 12/16/2006   malignant   BREAST EXCISIONAL BIOPSY Right 2018   benign lumpectomy   BREAST LUMPECTOMY Right 02/2007   BREAST LUMPECTOMY WITH RADIOACTIVE SEED LOCALIZATION Right 07/29/2017   Procedure: RIGHT BREAST LUMPECTOMY WITH RADIOACTIVE SEED LOCALIZATION;  Surgeon: Fanny Skates, MD;  Location: [facility];  Service: General;  Laterality: Right;   BREAST SURGERY Right 2008   lumpectomy, cancer   CARDIAC CATHETERIZATION     CHOLECYSTECTOMY  1999   COLONOSCOPY  2008   Dr. Oneida Alar: multiple polyps. Path not available at time of visit.    COLONOSCOPY WITH PROPOFOL N/A 04/25/2014   Dr. Oneida Alar: Multiple tubular adenomas removed. Diverticulosis. Moderate internal hemorrhoids. Next colonoscopy planned for August 2018.   ESOPHAGOGASTRODUODENOSCOPY (EGD) WITH PROPOFOL N/A 06/19/2016   Procedure: ESOPHAGOGASTRODUODENOSCOPY (EGD) WITH PROPOFOL;  Surgeon: Daneil Dolin, MD;  [facility];  Service: Endoscopy;  Laterality: N/A;   MASTECTOMY W/ SENTINEL NODE BIOPSY Right 06/09/2018   Procedure: RIGHT TOTAL  MASTECTOMY WITH SENTINEL LYMPH NODE BIOPSY;  Surgeon: Fanny Skates, MD;  Location: [facility];  Service: General;  Laterality: Right;   POLYPECTOMY N/A 04/25/2014   Procedure: POLYPECTOMY;  Surgeon: Danie Binder, MD;  Location: [facility];  Service: Endoscopy;  Laterality: N/A;  Ascending and Decending Colon x3 , Transverse colon x2, rectal   PORTACATH PLACEMENT N/A 06/09/2018   Procedure: INSERTION PORT-A-CATH;  Surgeon: Fanny Skates, MD;  Location: [facility];  Service: General;  Laterality: N/A;   RIGHT/LEFT HEART CATH AND CORONARY ANGIOGRAPHY N/A 07/10/2017   Procedure: RIGHT/LEFT HEART CATH AND CORONARY ANGIOGRAPHY;  Surgeon: Larey Dresser, MD;  Location: [facility];  Service: Cardiovascular;  Laterality: N/A;   Family History Family History  Problem Relation Age of Onset   Colon cancer Father 53       died at age 27   Hypertension Sister    Heart disease Sister    Cancer Sister        unknown form   Dementia Mother    Stroke Mother 73       left hemiparesis   Cancer Brother 44       prostate   Cancer Paternal Aunt        NOS   Lung cancer Paternal Uncle    Other Paternal Uncle        lightning strike   Other Paternal Uncle        hit by train   Other Paternal 41        old age   70 Cousin        NOS pat first cousin   Cancer Cousin        NOS pat first cousin   Prostate cancer Cousin        pat first cousin    Social History Social History   Tobacco Use   Smoking status: Never   Smokeless tobacco: Never  Vaping Use   Vaping Use: Never used  Substance Use Topics   Alcohol use: No   Drug use: No   Allergies Aspirin, Lisinopril, and Sudafed [pseudoephedrine hcl]  Review of Systems Review of Systems  Constitutional:  Positive for fatigue.  Neurological:  Positive for  speech difficulty.    Physical Exam Vital Signs  I have reviewed the triage vital signs BP 107/65 (BP Location: Left Wrist)   Pulse 64   Temp 97.8 F (36.6 C) (Oral)   Resp (!)  23   Ht '4\' 9"'$  (1.448 m)   Wt 74.8 kg   SpO2 94%   BMI 35.68 kg/m   Physical Exam Vitals and nursing note reviewed.  Constitutional:      General: She is not in acute distress.    Appearance: She is well-developed. She is ill-appearing.  HENT:     Head: Normocephalic and atraumatic.  Eyes:     Conjunctiva/sclera: Conjunctivae normal.  Cardiovascular:     Rate and Rhythm: Normal rate and regular rhythm.     Heart sounds: No murmur heard. Pulmonary:     Effort: Pulmonary effort is normal. No respiratory distress.     Breath sounds: Normal breath sounds.  Abdominal:     Palpations: Abdomen is soft.     Tenderness: There is no abdominal tenderness.  Musculoskeletal:        General: No swelling.     Cervical back: Neck supple.  Skin:    General: Skin is warm and dry.     Capillary Refill: Capillary refill takes less than 2 seconds.  Neurological:     Mental Status: She is alert.     Cranial Nerves: No cranial nerve deficit.     Sensory: No sensory deficit.     Motor: No weakness.  Psychiatric:        Mood and Affect: Mood normal.     ED Results and Treatments Labs (all labs ordered are listed, but only abnormal results are displayed) Labs Reviewed  CBC - Abnormal; Notable for the following components:      Result Value   WBC 20.6 (*)    RBC 3.78 (*)    Hemoglobin 10.5 (*)    HCT 33.1 (*)    RDW 19.4 (*)    All other components within normal limits  DIFFERENTIAL - Abnormal; Notable for the following components:   Neutro Abs 17.6 (*)    Monocytes Absolute 1.3 (*)    Abs Immature Granulocytes 0.13 (*)    All other components within normal limits  COMPREHENSIVE METABOLIC PANEL - Abnormal; Notable for the following components:   Chloride 116 (*)    CO2 <7 (*)    Glucose, Bld 120 (*)    BUN 97 (*)    Creatinine, Ser 6.34 (*)    GFR, Estimated 6 (*)    All other components within normal limits  HEMOGLOBIN A1C - Abnormal; Notable for the following components:    Hgb A1c MFr Bld 6.3 (*)    All other components within normal limits  BRAIN NATRIURETIC PEPTIDE - Abnormal; Notable for the following components:   B Natriuretic Peptide 157.6 (*)    All other components within normal limits  GLUCOSE, CAPILLARY - Abnormal; Notable for the following components:   Glucose-Capillary 103 (*)    All other components within normal limits  I-STAT CHEM 8, ED - Abnormal; Notable for the following components:   Chloride 126 (*)    BUN 94 (*)    Creatinine, Ser 7.60 (*)    Glucose, Bld 114 (*)    Calcium, Ion 1.04 (*)    TCO2 8 (*)    Hemoglobin 10.5 (*)    HCT 31.0 (*)    All other components within normal limits  I-STAT CHEM 8,  ED - Abnormal; Notable for the following components:   Chloride 122 (*)    BUN 105 (*)    Creatinine, Ser 6.80 (*)    Glucose, Bld 103 (*)    TCO2 11 (*)    Hemoglobin 9.5 (*)    HCT 28.0 (*)    All other components within normal limits  I-STAT VENOUS BLOOD GAS, ED - Abnormal; Notable for the following components:   pH, Ven 7.065 (*)    pCO2, Ven 32.2 (*)    pO2, Ven 104 (*)    Bicarbonate 9.2 (*)    TCO2 10 (*)    Acid-base deficit 20.0 (*)    Sodium 146 (*)    HCT 28.0 (*)    Hemoglobin 9.5 (*)    All other components within normal limits  TROPONIN I (HIGH SENSITIVITY) - Abnormal; Notable for the following components:   Troponin I (High Sensitivity) 26 (*)    All other components within normal limits  RESP PANEL BY RT-PCR (FLU A&B, COVID) ARPGX2  MRSA NEXT GEN BY PCR, NASAL  ETHANOL  PROTIME-INR  APTT  LACTIC ACID, PLASMA  RAPID URINE DRUG SCREEN, HOSP PERFORMED  URINALYSIS, ROUTINE W REFLEX MICROSCOPIC  SODIUM, URINE, RANDOM  CREATININE, URINE, RANDOM  BLOOD GAS, VENOUS  LACTIC ACID, PLASMA  RENAL FUNCTION PANEL  CK  BASIC METABOLIC PANEL  MAGNESIUM  PHOSPHORUS  PROCALCITONIN  LIPASE, BLOOD  CBC  CBC  BASIC METABOLIC PANEL  MAGNESIUM  PHOSPHORUS  TROPONIN I (HIGH SENSITIVITY)                                                                                                                           Radiology US RENAL  Result Date: 02/27/2022 CLINICAL DATA:  Acute kidney injury EXAM: RENAL / URINARY TRACT ULTRASOUND COMPLETE COMPARISON:  None Available. FINDINGS: Right Kidney: Renal measurements: 8.9 x 3.8 x 3.6 cm = volume: 62.86 mL. Mildly increased renal cortical echogenicity. No mass or hydronephrosis visualized. Left Kidney: Renal measurements: 7.8 x 5.1 x 4.3 cm = volume: 87.75 mL. Mildly increased renal cortical echogenicity. No mass or hydronephrosis visualized. Bladder: Appears normal for degree of bladder distention. Other: None. IMPRESSION: No hydronephrosis.  Findings of medical renal disease. Electronically Signed   By: Maurine Simmering M.D.   On: 02/27/2022 18:44   DG Chest 2 View  Result Date: 02/27/2022 CLINICAL DATA:  Dyspnea on exertion. EXAM: CHEST - 2 VIEW COMPARISON:  Chest x-ray 06/09/2018. FINDINGS: Heart is mildly enlarged, unchanged. Calcified right mediastinal and right hilar lymph nodes are again seen. There is mild central pulmonary vascular congestion. There is no focal lung infiltrate, pleural effusion or pneumothorax. No acute fractures are seen. Right axillary surgical clips are present. IMPRESSION: 1. No acute cardiopulmonary process. 2. Mild cardiomegaly. 3. Old granulomatous disease. Electronically Signed   By: Ronney Asters M.D.   On: 02/27/2022 18:07   CT HEAD WO CONTRAST  Result Date: 02/27/2022 CLINICAL DATA:  Slurred speech  weakness EXAM: CT HEAD WITHOUT CONTRAST TECHNIQUE: Contiguous axial images were obtained from the base of the skull through the vertex without intravenous contrast. RADIATION DOSE REDUCTION: This exam was performed according to the departmental dose-optimization program which includes automated exposure control, adjustment of the mA and/or kV according to patient size and/or use of iterative reconstruction technique. COMPARISON:  09/28/2016 FINDINGS:  Brain: Redemonstrated planum sphenoidale meningioma, which is not well evaluated given the absence of intravenous contrast, which measures approximately 3.3 x 2.9 x 2.4 cm cm (AP x TR x CC) (series 6, image 30 and series 5, image 21), previously 3.4 x 2.5 x 2.1 cm on 09/28/2016 when remeasured similarly. Hypodensity in the adjacent left frontal lobe (series 5, image 17 and series 3, image 12), which is favored to represent the sequela of remote infarct but could also represent edema associated with the mass. No evidence of acute infarction, hemorrhage, or midline shift. No hydrocephalus or extra-axial fluid collection. Vascular: No hyperdense vessel. Atherosclerotic calcifications in the intracranial carotid and vertebral arteries. Skull: Normal. Negative for fracture or focal lesion. Sinuses/Orbits: Mucosal thickening in the maxillary sinuses. The orbits are unremarkable. Other: The mastoid air cells are well aerated. IMPRESSION: 1. Interval increase in the size of a previously noted planum sphenoidale meningioma, with interval development of hypodensity in the inferior left frontal lobe, favored to represent sequela of remote infarct but possibly also representing edema related to the mass. An MRI with and without contrast is recommended for further evaluation. 2. No evidence of acute infarct. These results were called by telephone at the time of interpretation on 02/27/2022 at 3:51 pm to provider [place] , who verbally acknowledged these results. Electronically Signed   By: Merilyn Baba M.D.   On: 02/27/2022 15:51   CT Cervical Spine Wo Contrast  Result Date: 02/27/2022 CLINICAL DATA:  Neck trauma, after a fall off the bed) EXAM: CT CERVICAL SPINE WITHOUT CONTRAST TECHNIQUE: Multidetector CT imaging of the cervical spine was performed without intravenous contrast. Multiplanar CT image reconstructions were also generated. RADIATION DOSE REDUCTION: This exam was performed according to the departmental  dose-optimization program which includes automated exposure control, adjustment of the mA and/or kV according to patient size and/or use of iterative reconstruction technique. COMPARISON:  None Available. FINDINGS: Alignment: Minimal retrolisthesis at C4-C5. Skull base and vertebrae: No acute fracture. No primary bone lesion or focal pathologic process. Moderate cervical spondylosis with prominent marginal osteophytes, most severe at C4-C5. Posterior osteophyte ridge causes abutment of the ventral cervical cord. Soft tissues and spinal canal: No prevertebral fluid or swelling. No visible canal hematoma. Disc levels:  There is some narrowing of the disc space at C4-C5. Upper chest: Negative. Other: None. IMPRESSION: No fracture, subluxation or dislocation. Moderate cervical spondylosis most prominent at C4-C5 with minimal retrolisthesis, prominent marginal osteophytes and decrease in the height of the disc space. Electronically Signed   By: Frazier Richards M.D.   On: 02/27/2022 15:41    Pertinent labs & imaging results that were available during my care of the patient were reviewed by me and considered in my medical decision making (see MDM for details).  Medications Ordered in ED Medications  sodium bicarbonate 150 mEq in sterile water 1,150 mL infusion ( Intravenous Infusion Verify 02/27/22 2100)  lactated ringers bolus 1,000 mL (has no administration in time range)  dexamethasone (DECADRON) injection 4 mg (has no administration in time range)  ondansetron (ZOFRAN) injection 4 mg (has no administration in time range)  docusate sodium (  COLACE) capsule 100 mg (has no administration in time range)  polyethylene glycol (MIRALAX / GLYCOLAX) packet 17 g (has no administration in time range)  heparin injection 5,000 Units (has no administration in time range)  Chlorhexidine Gluconate Cloth 2 % PADS 6 each (6 each Topical Given 02/27/22 2122)  insulin aspart (novoLOG) injection 0-9 Units (has no administration in  time range)  LORazepam (ATIVAN) tablet 0.5 mg (0.5 mg Oral Given 02/27/22 1954)  lactated ringers bolus 1,000 mL (0 mLs Intravenous Stopped 02/27/22 2055)  dexamethasone (DECADRON) injection 10 mg (10 mg Intravenous Given 02/27/22 1957)  sodium bicarbonate injection 50 mEq (50 mEq Intravenous Given 02/27/22 2008)                                                                                                                                     Procedures .Critical Care  Performed by: Teressa Lower, MD Authorized by: Teressa Lower, MD   Critical care provider statement:    Critical care time (minutes):  75   Critical care was necessary to treat or prevent imminent or life-threatening deterioration of the following conditions:  Renal failure   Critical care was time spent personally by me on the following activities:  Development of treatment plan with patient or surrogate, discussions with consultants, evaluation of patient's response to treatment, examination of patient, ordering and review of laboratory studies, ordering and review of radiographic studies, ordering and performing treatments and interventions, pulse oximetry, re-evaluation of patient's condition and review of old charts   (including critical care time)  Medical Decision Making / ED Course   This patient presents to the ED for concern of vomiting, fatigue, slurred speech, this involves an extensive number of treatment options, and is a complaint that carries with it a high risk of complications and morbidity.  The differential diagnosis includes meningioma expansion, electrolyte abnormality, uremia, acidosis  MDM: Patient seen emergency room for evaluation of multiple complaints as described above.  Physical exam with no focal motor or sensory deficits.  No cranial nerve deficits.  Patient does appear fatigued.  Laboratory evaluation is concerning with a significant new creatinine elevation to 6.34 with a BUN of 97 and an  undetectable bicarb.  Initial CBC with a leukocytosis to 17.7, anemia to 9.5.  Lactic acid normal.  CT head and C-spine obtained that shows an enlargement of the patient's meningioma with possible surrounding edema.  Patient's initial pH was concerning at 7.065.  Nephrology consulted who recommends aggressive fluid resuscitation, renal ultrasound and bicarb administration to see if we can avoid the need for dialysis.  Renal ultrasound concerning for medical disease but no obstructive uropathy.  Neurosurgery also consulted who states that the patient will need a contrasted MRI and once kidney function improves enough to receive the scan they will likely need to consent the patient for surgery likely next week.  They are recommending 10 mg Decadron now and 4  mg every 6h.  Patient require ICU admission given significant acidosis likely secondary to new renal failure.  Patient then admitted to the ICU.   Additional history obtained: -Additional history obtained from family members -External records from outside source obtained and reviewed including: Chart review including previous notes, labs, imaging, consultation notes   Lab Tests: -I ordered, reviewed, and interpreted labs.   The pertinent results include:   Labs Reviewed  CBC - Abnormal; Notable for the following components:      Result Value   WBC 20.6 (*)    RBC 3.78 (*)    Hemoglobin 10.5 (*)    HCT 33.1 (*)    RDW 19.4 (*)    All other components within normal limits  DIFFERENTIAL - Abnormal; Notable for the following components:   Neutro Abs 17.6 (*)    Monocytes Absolute 1.3 (*)    Abs Immature Granulocytes 0.13 (*)    All other components within normal limits  COMPREHENSIVE METABOLIC PANEL - Abnormal; Notable for the following components:   Chloride 116 (*)    CO2 <7 (*)    Glucose, Bld 120 (*)    BUN 97 (*)    Creatinine, Ser 6.34 (*)    GFR, Estimated 6 (*)    All other components within normal limits  HEMOGLOBIN A1C -  Abnormal; Notable for the following components:   Hgb A1c MFr Bld 6.3 (*)    All other components within normal limits  BRAIN NATRIURETIC PEPTIDE - Abnormal; Notable for the following components:   B Natriuretic Peptide 157.6 (*)    All other components within normal limits  GLUCOSE, CAPILLARY - Abnormal; Notable for the following components:   Glucose-Capillary 103 (*)    All other components within normal limits  I-STAT CHEM 8, ED - Abnormal; Notable for the following components:   Chloride 126 (*)    BUN 94 (*)    Creatinine, Ser 7.60 (*)    Glucose, Bld 114 (*)    Calcium, Ion 1.04 (*)    TCO2 8 (*)    Hemoglobin 10.5 (*)    HCT 31.0 (*)    All other components within normal limits  I-STAT CHEM 8, ED - Abnormal; Notable for the following components:   Chloride 122 (*)    BUN 105 (*)    Creatinine, Ser 6.80 (*)    Glucose, Bld 103 (*)    TCO2 11 (*)    Hemoglobin 9.5 (*)    HCT 28.0 (*)    All other components within normal limits  I-STAT VENOUS BLOOD GAS, ED - Abnormal; Notable for the following components:   pH, Ven 7.065 (*)    pCO2, Ven 32.2 (*)    pO2, Ven 104 (*)    Bicarbonate 9.2 (*)    TCO2 10 (*)    Acid-base deficit 20.0 (*)    Sodium 146 (*)    HCT 28.0 (*)    Hemoglobin 9.5 (*)    All other components within normal limits  TROPONIN I (HIGH SENSITIVITY) - Abnormal; Notable for the following components:   Troponin I (High Sensitivity) 26 (*)    All other components within normal limits  RESP PANEL BY RT-PCR (FLU A&B, COVID) ARPGX2  MRSA NEXT GEN BY PCR, NASAL  ETHANOL  PROTIME-INR  APTT  LACTIC ACID, PLASMA  RAPID URINE DRUG SCREEN, HOSP PERFORMED  URINALYSIS, ROUTINE W REFLEX MICROSCOPIC  SODIUM, URINE, RANDOM  CREATININE, URINE, RANDOM  BLOOD GAS, VENOUS  LACTIC ACID,  PLASMA  RENAL FUNCTION PANEL  CK  BASIC METABOLIC PANEL  MAGNESIUM  PHOSPHORUS  PROCALCITONIN  LIPASE, BLOOD  CBC  CBC  BASIC METABOLIC PANEL  MAGNESIUM  PHOSPHORUS   TROPONIN I (HIGH SENSITIVITY)      EKG   EKG Interpretation  Date/Time:  Thursday February 27 2022 14:28:30 EDT Ventricular Rate:  65 PR Interval:  182 QRS Duration: 122 QT Interval:  422 QTC Calculation: 438 R Axis:   -34 Text Interpretation: Normal sinus rhythm Left axis deviation Abnormal ECG When compared with ECG of 11-Nov-2021 11:16, PREVIOUS ECG IS PRESENT Confirmed by [place] ([id number]) on 02/27/2022 5:50:14 PM         Imaging Studies ordered: I ordered imaging studies including CT head, C-spine, chest x-ray, renal ultrasound I independently visualized and interpreted imaging. I agree with the radiologist interpretation   Medicines ordered and prescription drug management: Meds ordered this encounter  Medications   LORazepam (ATIVAN) tablet 0.5 mg   lactated ringers bolus 1,000 mL   DISCONTD: lactated ringers infusion   dexamethasone (DECADRON) injection 10 mg   sodium bicarbonate 150 mEq in sterile water 1,150 mL infusion    Sodium Bicarbonate 150 mEq added to 1L SWFI = 261 mOsm/L.   sodium bicarbonate injection 50 mEq   lactated ringers bolus 1,000 mL   DISCONTD: dexamethasone (DECADRON) injection 4 mg   dexamethasone (DECADRON) injection 4 mg   ondansetron (ZOFRAN) injection 4 mg   docusate sodium (COLACE) capsule 100 mg   polyethylene glycol (MIRALAX / GLYCOLAX) packet 17 g   heparin injection 5,000 Units   Chlorhexidine Gluconate Cloth 2 % PADS 6 each   insulin aspart (novoLOG) injection 0-9 Units    Order Specific Question:   Correction coverage:    Answer:   Sensitive (thin, NPO, renal)    Order Specific Question:   CBG < 70:    Answer:   Implement Hypoglycemia Standing Orders and refer to Hypoglycemia Standing Orders sidebar report    Order Specific Question:   CBG 70 - 120:    Answer:   0 units    Order Specific Question:   CBG 121 - 150:    Answer:   1 unit    Order Specific Question:   CBG 151 - 200:    Answer:   2 units    Order Specific  Question:   CBG 201 - 250:    Answer:   3 units    Order Specific Question:   CBG 251 - 300:    Answer:   5 units    Order Specific Question:   CBG 301 - 350:    Answer:   7 units    Order Specific Question:   CBG 351 - 400    Answer:   9 units    Order Specific Question:   CBG > 400    Answer:   call MD and obtain STAT lab verification    -I have reviewed the patients home medicines and have made adjustments as needed  Critical interventions Bicarb, fluid resuscitation, nephrology consultation, neurosurgical consultation  Consultations Obtained: I requested consultation with the nephrologist and neurosurgeons,  and discussed lab and imaging findings as well as pertinent plan - they recommend: Aggressive fluid resuscitation, bicarb, steroids for mass and ultimate contrasted MRI after improvement of kidney function   Cardiac Monitoring: The patient was maintained on a cardiac monitor.  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   Anxiety    Breast cancer ([place]) 2008   right - s/p lumpectomy->chemo, radiation   Depression    Dysrhythmia    hx SVT   Family history of colon cancer    Family history of prostate cancer    GERD (gastroesophageal reflux disease)    H/O: hysterectomy    Hiatal hernia    History of cancer chemotherapy    History of radiation therapy    Hyperlipidemia    Hypertension 01/25/2018   Nonischemic cardiomyopathy ([place])    Personal history of radiation therapy 01/05/209   SVT (supraventricular tachycardia) ([facility])    short RP SVT  documented 4/23   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[place])    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     Final Clinical Impression(s) / ED Diagnoses Final diagnoses:  None     '@PCDICTATION'$ @    Teressa Lower, MD 02/27/22 2217

## 2022-02-27 NOTE — Consult Note (Addendum)
[facility] Nephrology Consultation Note  Requesting MD: Dr. Orma Flaming Reason for consult: AKI  HPI:  Tara [name] is a 76 y.o. female with past medical history significant for hypertension, hyperlipidemia, meningioma, combined systolic and diastolic CHF, breast cancer, SVT, presented with slurred speech and confusion, seen as a consultation for acute kidney injury as requested by Dr. Rogers Blocker from ER. It seems like the patient has been having generalized weakness, confusion and slurred speech gradually decline recently.  Apparently last night patient fell out of bed in the middle of night and could not get back into bed herself.  She was found on the floor but was maintaining mental status. In the ER, the blood pressure was 91/51, in room air.  The labs showed BUN 97, creatinine level 6.3, CO2 less than 7, hemoglobin 10.5, WBC 20.6.  She received a liter of LR in ER with improvement of blood pressure.  The CT head with interval increase in the size of meningioma.  Neurosurgery was consulted and patient was started on Decadron.  The kidney ultrasound showed bilateral echogenic kidney without hydronephrosis. She has CKD stage III with baseline serum creatinine level seems to be around 1.4-1.6 recently.  Not seen by nephrologist in the past. She is taking Entresto and spironolactone for CHF.  She was a started on Jardiance by her PCP few months ago.  The patient reports taking ibuprofen 4 mg each, takes about 4 pills a day.  Total ibuprofen intake around 1600 mg daily. Currently see feels fatigue, still having slurred speech, some nausea and vomiting.  Reportedly her p.o. intake is not great. Denies dysuria, urgency, frequency, pelvic or flank pain.  Reportedly no change in urine output. Her husband and daughter were presented at the bedside in ER.   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    Anxiety    Breast cancer ([place]) 2008   right - s/p lumpectomy->chemo, radiation   Depression    Dysrhythmia    hx SVT   Family history of colon cancer    Family history of prostate cancer    GERD (gastroesophageal reflux disease)    H/O: hysterectomy    Hiatal hernia    History of cancer chemotherapy    History of radiation therapy    Hyperlipidemia    Hypertension 01/25/2018   Nonischemic cardiomyopathy ([place])    Personal history of radiation therapy 01/05/209   SVT (supraventricular tachycardia) ([place])    short RP SVT documented 0/10   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[place])    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)   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    Past Surgical History:  Procedure Laterality Date   ABDOMINAL HYSTERECTOMY  1994   fibroids,    BIOPSY  06/19/2016   Procedure: BIOPSY;  Surgeon: Daneil Dolin, MD;  [facility];  Service: Endoscopy;;  gastric duodenum   BREAST BIOPSY Right 12/16/2006   malignant   BREAST EXCISIONAL BIOPSY Right 2018   benign lumpectomy   BREAST LUMPECTOMY Right 02/2007   BREAST LUMPECTOMY WITH RADIOACTIVE SEED LOCALIZATION Right 07/29/2017   Procedure: RIGHT BREAST LUMPECTOMY WITH RADIOACTIVE SEED LOCALIZATION;  Surgeon: Fanny Skates, MD;  Location: [place];  Service: General;  Laterality: Right;   BREAST SURGERY Right 2008   lumpectomy, cancer   CARDIAC  CATHETERIZATION     CHOLECYSTECTOMY  1999   COLONOSCOPY  2008   Dr. Oneida Alar: multiple polyps. Path not available at time of visit.    COLONOSCOPY WITH PROPOFOL N/A 04/25/2014   Dr. Oneida Alar: Multiple tubular adenomas removed. Diverticulosis. Moderate internal hemorrhoids. Next colonoscopy planned for August 2018.   ESOPHAGOGASTRODUODENOSCOPY (EGD) WITH PROPOFOL N/A 06/19/2016   Procedure: ESOPHAGOGASTRODUODENOSCOPY (EGD) WITH PROPOFOL;  Surgeon: Daneil Dolin, MD;  [facility];  Service: Endoscopy;  Laterality: N/A;    MASTECTOMY W/ SENTINEL NODE BIOPSY Right 06/09/2018   Procedure: RIGHT TOTAL MASTECTOMY WITH SENTINEL LYMPH NODE BIOPSY;  Surgeon: Fanny Skates, MD;  Location: [place];  Service: General;  Laterality: Right;   POLYPECTOMY N/A 04/25/2014   Procedure: POLYPECTOMY;  Surgeon: Danie Binder, MD;  Location: [facility];  Service: Endoscopy;  Laterality: N/A;  Ascending and Decending Colon x3 , Transverse colon x2, rectal   PORTACATH PLACEMENT N/A 06/09/2018   Procedure: INSERTION PORT-A-CATH;  Surgeon: Fanny Skates, MD;  Location: [place];  Service: General;  Laterality: N/A;   RIGHT/LEFT HEART CATH AND CORONARY ANGIOGRAPHY N/A 07/10/2017   Procedure: RIGHT/LEFT HEART CATH AND CORONARY ANGIOGRAPHY;  Surgeon: Larey Dresser, MD;  Location: [place] [facility];  Service: Cardiovascular;  Laterality: N/A;    Family Hx:  Family History  Problem Relation Age of Onset   Colon cancer Father 51       died at age 14   Hypertension Sister    Heart disease Sister    Cancer Sister        unknown form   Dementia Mother    Stroke Mother 31       left hemiparesis   Cancer Brother 40       prostate   Cancer Paternal Aunt        NOS   Lung cancer Paternal Uncle    Other Paternal Uncle        lightning strike   Other Paternal Uncle        hit by train   Other Paternal 48        old age   65 Cousin        NOS pat first cousin   Cancer Cousin        NOS pat first cousin   Prostate cancer Cousin        pat first cousin    Social History:  reports that she has never smoked. She has never used smokeless tobacco. She reports that she does not drink alcohol and does not use drugs.  Allergies:  Allergies  Allergen Reactions   Aspirin Other (See Comments)    Reports GI bleed--pt is currently taking   Lisinopril Cough   Sudafed  [Pseudoephedrine Hcl] Other (See Comments)    Sore throat   Sudafed [Pseudoephedrine Hcl]     Pt reports she had drainage in throat that made her throat hurt.      Medications: Prior to Admission medications   Medication Sig Start Date End Date Taking? Authorizing Provider  acetaminophen (TYLENOL) 500 MG tablet Take 500-1,000 mg by mouth every 6 (six) hours as needed (FOR PAIN.).    [provider]  aspirin EC 81 MG tablet Take 81 mg by mouth daily.     [provider]  azelastine (ASTELIN) 0.1 % nasal spray Place into both nostrils as needed for rhinitis. Use in each nostril as directed    [provider]  clonazePAM (KLONOPIN) 0.5 MG  tablet Take 1 tablet (0.5 mg total) by mouth at bedtime. 01/20/22   [name], MD  docusate sodium (COLACE) 100 MG capsule Take 200 mg by mouth 2 (two) times daily.     [provider]  hydrocortisone (ANUSOL-HC) 2.5 % rectal cream APPLY RECTALLY TWICE A DAY. 03/26/21   [name], MD  hydrocortisone (PROCTOZONE-HC) 2.5 % rectal cream Place 1 application rectally 2 (two) times daily. 10/09/21   [name], MD  hydrocortisone 2.5 % cream APPLY TO THE AFFECTED AREA(S) TWICE A DAY. 09/27/21   [name], MD  JARDIANCE 10 MG TABS tablet TAKE ONE TABLET BY MOUTH ONCE DAILY. 01/03/22   Larey Dresser, MD  metoCLOPramide (REGLAN) 10 MG tablet TAKE ONE TABLET BY MOUTH TWICE DAILY. 02/24/22   [name], MD  metoprolol succinate (TOPROL-XL) 50 MG 24 hr tablet Take 1 tablet (50 mg total) by mouth 2 (two) times daily. 01/29/22   Larey Dresser, MD  montelukast (SINGULAIR) 10 MG tablet TAKE ONE TABLET BY MOUTH AT BEDTIME. 09/03/21   [name], MD  pantoprazole (PROTONIX) 40 MG tablet TAKE (1) TABLET BY MOUTH TWICE DAILY. 11/18/21   [name], MD  rosuvastatin (CRESTOR) 20 MG tablet Take 1 tablet (20 mg total) by mouth daily. 01/14/22   [name], MD  sacubitril-valsartan (ENTRESTO) 24-26 MG Take 1 tablet by mouth 2 (two) times daily. 04/05/21   Larey Dresser, MD  sertraline (ZOLOFT) 25 MG tablet Take 1 tablet (25 mg total) by  mouth daily. 01/20/22   [name], MD  spironolactone (ALDACTONE) 25 MG tablet TAKE 1 TABLET BY MOUTH ONCE A DAY. NEEDS APPOINTMENT FOR FUTURE FILLS. 02/10/22   Larey Dresser, MD  UNABLE TO FIND Cranial prothesis  DX C50.911 L65.9 11/01/21   [name], MD    I have reviewed the patient's current medications.  Labs: Renal Panel: Recent Labs    04/04/21 1130 04/11/21 1549 05/28/21 1200 07/11/21 1414 11/11/21 1108 01/02/22 1029 02/27/22 1438 02/27/22 1512 02/27/22 1851  NA 139 146* 143 145* 139 142 143 142 145  K 4.5 4.5 4.3 4.0 4.4 4.1 3.7 3.6 3.7  CL 115* 113* 113* 112* 111 111* 116* 126* 122*  CO2 17* 16* 16* 17* 19* 16* <7*  --   --   GLUCOSE 101* 97 108* 88 102* 99 120* 114* 103*  BUN 27* 20 30* 16 20 30* 97* 94* 105*  CREATININE 1.66* 1.52* 1.91* 1.45* 1.58* 1.70* 6.34* 7.60* 6.80*  CALCIUM 8.5* 8.7 9.4 8.8 8.8* 9.5 9.0  --   --   ALBUMIN  --   --  4.0  --   --  4.0 3.5  --   --      CBC:    Latest Ref Rng & Units 02/27/2022    6:51 PM 02/27/2022    3:12 PM 02/27/2022    2:38 PM  CBC  WBC 4.0 - 10.5 K/uL   20.6   Hemoglobin 12.0 - 15.0 g/dL 9.5  10.5  10.5   Hematocrit 36.0 - 46.0 % 28.0  31.0  33.1   Platelets 150 - 400 K/uL   259      Anemia Panel:  Recent Labs    05/28/21 1200 02/27/22 1438 02/27/22 1512 02/27/22 1851  HGB 11.1 10.5* 10.5* 9.5*  MCV 85 87.6  --   --     Recent Labs  Lab 02/27/22 1438  AST 34  ALT 22  ALKPHOS 78  BILITOT 0.6  PROT 7.2  ALBUMIN 3.5    Lab Results  Component Value Date   HGBA1C 6.3 (H) 02/27/2022    ROS:  Pertinent items noted in HPI and remainder of comprehensive ROS otherwise negative.  Physical Exam: Vitals:   02/27/22 1426  BP: (!) 91/51  Pulse: 65  Resp: 18  Temp: 98.1 F (36.7 C)  SpO2: 94%     General exam: Appears calm and comfortable  Respiratory system: Clear to auscultation. Respiratory effort normal. No wheezing or crackle Cardiovascular system: S1 & S2 heard, RRR.  No  pedal edema. Gastrointestinal system: Abdomen is nondistended, soft and nontender. Normal bowel sounds heard. Central nervous system: Alert and oriented. No focal neurological deficits. Extremities: No edema, no cyanosis. Skin: No rashes, lesions or ulcers Psychiatry: Judgement and insight appear normal. Mood & affect appropriate.   Assessment/Plan:  #Acute kidney injury on CKD IIIb presumably ischemic ATN due to hypotension/reduced renal perfusion caused by Delene Loll, Aldactone, Jardiance concomitant with analgesic nephropathy (around 1.6 g of Ibuprofen/day).  Presented hypotensive in ER which was improved with IVF.  The kidney ultrasound ruled out hydronephrosis.  I will check UA, CK.  She already received a liter of LR.  I will switch IV fluid with sodium bicarbonate because of metabolic acidosis.  Hopefully, she will have gradual renal recovery.  She understands that she may need dialysis if no improvement in kidney function.  Fortunately, there is no need for dialysis tonight.  Continue to hold nephrotoxins including ACEI/ARB, diuretics, NSAIDs and IV contrast.  #Slurred speech, fall and weakness due to enlarged meningioma.  Neurosurgery was consulted and now on steroid.  #Metabolic acidosis: Switching fluid with sodium bicarbonate.  Monitor lab.  #Hypotension: Holding antihypertensives.  BP improving with IV fluid.  #History of CHF: Recent echo with improved EF.   Thank you for the consult.  We will follow with you.  Lavonia Eager Tanna Furry 02/27/2022, 7:08 PM  [facility].  Addendum: Received call from ER physician that the new lab just came back which showed pH of 7.06, PCO2 32.2.  We are just switching fluid to sodium bicarbonate.  Since patient is clinically stable we will watch her renal function and acidosis with medical management.  Hold dialysis tonight.  DCarolin Sicks.

## 2022-02-27 NOTE — Assessment & Plan Note (Signed)
Continue zoloft and ativan

## 2022-02-27 NOTE — H&P (Signed)
NAME:  Tara [name], MRN:  [id number], DOB:  02/28/1946, LOS: 0 ADMISSION DATE:  02/27/2022 CONSULTATION DATE:  02/27/2022 REFERRING MD:  K[place]mor - EDP CHIEF COMPLAINT:  Acute renal failure   History of Present Illness:  76 year old woman who presented to [facility] ED 6/8 with weakness, confusion and progressive worsening of slurred speech. Reportedly fell out of bed overnight and was unable to get back into bed unassisted. Found on the floor 6/8AM, alert. Family reports fatigue and progressive confusion/slurred speech for the last 1-2 weeks. 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   On ED arrival, patient was noted to have slurred speech without HA, unilateral strength deficit or facial droop. Reported vomiting over the last 1-2 days, more episodes today. Denies fever/chills, CP/SOB, cough/rhinorrhea, diarrhea, dysuria, HA. Endorses generalized weakness and constipation as well as urinary urgency. Afebrile on ED arrival with HR 65, BP soft 91/51 (MAP 64). Labs notable for WBC 20.6 (85% N), Na 143, K 3.7, bicarb < 7, Cr 6.34 (baseline 1.5-1.7), BUN 97; LFTs WNL. Trop mildly elevated at 26, BNP 157. LA 1.2. VBG with pH 7.06, bicarb 9.2. CXR demonstrating old granulomatous disease (remote TB), Renal US without hydronephrosis. CT C-spine negative. CT Head showed interval increase in known planum sphenoidale meningioma with sequelae of ?edema r/t mass. NSGY was consulted and dexamethasone initiated. Unable to obtain contrasted MRI at present due to ARF. Nephrology consulted for ARF with aim to manage without RRT.  PCCM consulted for ICU admission in the setting of acidosis and ARF.  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   Anxiety     Breast cancer ([place]) 2008   right - s/p lumpectomy->chemo, radiation   Depression    Dysrhythmia    hx SVT   Family history of colon cancer    Family history of prostate cancer    GERD (gastroesophageal reflux disease)    H/O: hysterectomy    Hiatal hernia    History of cancer chemotherapy    History of radiation therapy    Hyperlipidemia    Hypertension 01/25/2018   Nonischemic cardiomyopathy ([place])    Personal history of radiation therapy 01/05/209   SVT (supraventricular tachycardia) (HCC)    short RP SVT documented 4/09   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[place])    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)   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 Progressive weakness, fatigue, slurred speech over 1-2 weeks. Vomiting x 1-2 days. CT Head with increased size of known meningioma with ?sequelae of edema r/t mass. NSGY consulted. ARF suspected to be prerenal, Nephro consulted. PCCM consulted for admission.  Interim History / Subjective:  PCCM consulted for admission Feeling better than prior Appears weak with mildly slurred speech Endorses nausea, no further vomiting +UOP but urgency did not allow sample collection NSGY/Nephro consulted  Objective:  Blood pressure (!) 91/51, pulse 65, temperature 98.1 F (36.7 C), temperature source Oral, resp. rate 18, SpO2 94 %.  No intake or output data in the 24 hours ending 02/27/22 1936 There were no vitals filed for this visit.  Physical Examination: General: Acute-on-chronically ill-appearing elderly woman in NAD. HEENT: [place]/AT, anicteric sclera, PERRL, moist mucous membranes. Neuro: Awake, oriented x 3. Responds to verbal stimuli. Following commands consistently. Moves all 4 extremities  spontaneously. Generalized weakness. CV: RRR, no m/g/r. PULM: Breathing even and unlabored on RA. Lung fields CTAB, diminished at bilateral bases. GI: Soft, nontender, nondistended. Normoactive bowel sounds. Extremities: Bilateral symmetric 1+ nonpitting LE edema noted. Skin: Warm/dry, no rashes. Mild hyperpigmentation of BLE.  Resolved Hospital Problem List:    Assessment & Plan:  Acute renal failure, likely prerenal in the setting of dehydration Acidosis in the setting of ARF Prerenal azotemia Admitted with 2-day history of nausea/vomiting, fatigue/weakness and progressive confusion/slurred speech. Renal US without hydro. - Admit to ICU for close monitoring - Nephrology consulted, appreciate assistance - Avoiding emergent RRT as able - Correcting bicarb deficit/acidosis, continue bicarb gtt - Cautious fluid resuscitation - Trend BMP - Replete electrolytes as indicated - Monitor I&Os - F/u urine studies, I&O cath vs. external urinary catheter if unable to void - Avoid nephrotoxic agents as able - Ensure adequate renal perfusion  Leukocytosis WBC 20.6 with 85% N on admission. CXR negative - Trend WBC, fever curve - F/u PCT - F/u Cx data (UCx, BCx?) - Low threshold for empiric antibiotic coverage, given multiple presenting symptoms  Meningioma of planum sphenoidale CT Head 6/8 with interval increase of previously noted planum sphenoidale meningioma, interval development of hypodensity in the inferior left frontal lobe, favored to represent sequela of remote infarct but possibly also representing edema related to the mass. - NSGY consulted, appreciate recommendations - Decadron '10mg'$  x 1, then '4mg'$  Q6H - MRI Brain with/without contrast when safe from renal function standpoint  Nausea with vomiting History of constipation Likely multifactorial in the setting of ARF, ?meningioma. - Decadron as above - Antiemetics - Fluid resuscitation - Bowel regimen as appropriate  CHF,  systolic NICM History of systolic CHF with Echo 09/6107 EF 55%, moderately elevated PASP, RVSP 54.28mHg. - Monitor volume status, I&Os - Trend BNP - Repeat Echo - Hold Entresto, Aldactone and Jardiance in the setting of ARF  History of HTN History of Hyperlipidemia - Hold Toprol-XL in the setting of relative hypotension - Cardiac monitoring/tele - Continue ASA, resume statin as clinically appropriate  GERD - Continue PPI  Prediabetes A1c 6.3%. - Holding Jardiance in the setting of ARF - SSI - CBGs Q4H  Depression/anxiety - Resume home Zoloft as clinically appropriate - Would hold home Klonopin, given confusion  Best Practice: (right click and "Reselect all SmartList Selections" daily)   Diet/type: clear liquids DVT prophylaxis: prophylactic heparin  GI prophylaxis: PPI Lines: N/A Foley:  N/A Code Status:  full code Last date of multidisciplinary goals of care discussion [Pending]  Labs:  CBC: Recent Labs  Lab 02/27/22 1438 02/27/22 1512 02/27/22 1851 02/27/22 1914  WBC 20.6*  --   --   --   NEUTROABS 17.6*  --   --   --   HGB 10.5* 10.5* 9.5* 9.5*  HCT 33.1* 31.0* 28.0* 28.0*  MCV 87.6  --   --   --   PLT 259  --   --   --    Basic Metabolic Panel: Recent Labs  Lab 02/27/22 1438 02/27/22 1512 02/27/22 1851 02/27/22 1914  NA 143 142 145 146*  K 3.7 3.6 3.7 3.8  CL 116* 126* 122*  --  CO2 <7*  --   --   --   GLUCOSE 120* 114* 103*  --   BUN 97* 94* 105*  --   CREATININE 6.34* 7.60* 6.80*  --   CALCIUM 9.0  --   --   --    GFR: CrCl cannot be calculated (Unknown ideal weight.). Recent Labs  Lab 02/27/22 1438  WBC 20.6*   Liver Function Tests: Recent Labs  Lab 02/27/22 1438  AST 34  ALT 22  ALKPHOS 78  BILITOT 0.6  PROT 7.2  ALBUMIN 3.5   No results for input(s): "LIPASE", "AMYLASE" in the last 168 hours. No results for input(s): "AMMONIA" in the last 168 hours.  ABG:    Component Value Date/Time   PHART 7.479 (H) 09/28/2016 1747    PCO2ART 28.3 (L) 09/28/2016 1747   PO2ART 59.0 (L) 09/28/2016 1747   HCO3 9.2 (L) 02/27/2022 1914   TCO2 10 (L) 02/27/2022 1914   ACIDBASEDEF 20.0 (H) 02/27/2022 1914   O2SAT 95 02/27/2022 1914    Coagulation Profile: Recent Labs  Lab 02/27/22 1438  INR 1.1   Cardiac Enzymes: No results for input(s): "CKTOTAL", "CKMB", "CKMBINDEX", "TROPONINI" in the last 168 hours.  HbA1C: Hgb A1c MFr Bld  Date/Time Value Ref Range Status  02/27/2022 04:59 PM 6.3 (H) 4.8 - 5.6 % Final    Comment:    (NOTE) Pre diabetes:          5.7%-6.4%  Diabetes:              >6.4%  Glycemic control for   <7.0% adults with diabetes   01/12/2018 12:35 PM 6.0 (H) <5.7 % of total Hgb Final    Comment:    For someone without known diabetes, a hemoglobin  A1c value between 5.7% and 6.4% is consistent with prediabetes and should be confirmed with a  follow-up test. . For someone with known diabetes, a value <7% indicates that their diabetes is well controlled. A1c targets should be individualized based on duration of diabetes, age, comorbid conditions, and other considerations. . This assay result is consistent with an increased risk of diabetes. . Currently, no consensus exists regarding use of hemoglobin A1c for diagnosis of diabetes for children. .    CBG: No results for input(s): "GLUCAP" in the last 168 hours.  Review of Systems:   Review of systems completed with pertinent positives/negatives outlined in above HPI.  Past Medical History:  She,  has a past medical history of Abnormal mammogram of right breast (07/29/2017), Allergic eosinophilia (10/07/2016), Anxiety, Breast cancer ([place]) (2008), Depression, Dysrhythmia, Family history of colon cancer, Family history of prostate cancer, GERD (gastroesophageal reflux disease), H/O: hysterectomy, Hiatal hernia, History of cancer chemotherapy, History of radiation therapy, Hyperlipidemia, Hypertension (01/25/2018), Nonischemic cardiomyopathy  ([place]), Personal history of radiation therapy (01/05/209), SVT (supraventricular tachycardia) ([place]), 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[place]), TB (tuberculosis), and TB (tuberculosis).   Surgical History:   Past Surgical History:  Procedure Laterality Date   ABDOMINAL HYSTERECTOMY  1994   fibroids,    BIOPSY  06/19/2016   Procedure: BIOPSY;  Surgeon: Daneil Dolin, MD;  [facility];  Service: Endoscopy;;  gastric duodenum   BREAST BIOPSY Right 12/16/2006   malignant   BREAST EXCISIONAL BIOPSY Right 2018   benign lumpectomy   BREAST LUMPECTOMY Right 02/2007   BREAST LUMPECTOMY WITH RADIOACTIVE SEED LOCALIZATION Right 07/29/2017   Procedure: RIGHT BREAST LUMPECTOMY WITH RADIOACTIVE SEED LOCALIZATION;  Surgeon: Fanny Skates, MD;  Location: [place];  Service:  General;  Laterality: Right;   BREAST SURGERY Right 2008   lumpectomy, cancer   CARDIAC CATHETERIZATION     CHOLECYSTECTOMY  1999   COLONOSCOPY  2008   Dr. Oneida Alar: multiple polyps. Path not available at time of visit.    COLONOSCOPY WITH PROPOFOL N/A 04/25/2014   Dr. Oneida Alar: Multiple tubular adenomas removed. Diverticulosis. Moderate internal hemorrhoids. Next colonoscopy planned for August 2018.   ESOPHAGOGASTRODUODENOSCOPY (EGD) WITH PROPOFOL N/A 06/19/2016   Procedure: ESOPHAGOGASTRODUODENOSCOPY (EGD) WITH PROPOFOL;  Surgeon: Daneil Dolin, MD;  [facility];  Service: Endoscopy;  Laterality: N/A;   MASTECTOMY W/ SENTINEL NODE BIOPSY Right 06/09/2018   Procedure: RIGHT TOTAL MASTECTOMY WITH SENTINEL LYMPH NODE BIOPSY;  Surgeon: Fanny Skates, MD;  Location: [place];  Service: General;  Laterality: Right;   POLYPECTOMY N/A 04/25/2014   Procedure: POLYPECTOMY;  Surgeon: Danie Binder, MD;  Location: [facility];  Service: Endoscopy;  Laterality: N/A;  Ascending and Decending Colon x3 , Transverse colon x2, rectal   PORTACATH PLACEMENT N/A 06/09/2018   Procedure: INSERTION PORT-A-CATH;  Surgeon: Fanny Skates, MD;  Location: [place];   Service: General;  Laterality: N/A;   RIGHT/LEFT HEART CATH AND CORONARY ANGIOGRAPHY N/A 07/10/2017   Procedure: RIGHT/LEFT HEART CATH AND CORONARY ANGIOGRAPHY;  Surgeon: Larey Dresser, MD;  Location: [place] [facility];  Service: Cardiovascular;  Laterality: N/A;   Social History:   reports that she has never smoked. She has never used smokeless tobacco. She reports that she does not drink alcohol and does not use drugs.   Family History:  Her family history includes Cancer in her cousin, cousin, paternal aunt, and sister; Cancer (age of onset: 26) in her brother; Colon cancer (age of onset: 63) in her father; Dementia in her mother; Heart disease in her sister; Hypertension in her sister; Lung cancer in her paternal uncle; Other in her paternal aunt, paternal uncle, and paternal uncle; Prostate cancer in her cousin; Stroke (age of onset: 31) in her mother.   Allergies: Allergies  Allergen Reactions   Aspirin Other (See Comments)    Reports GI bleed--pt is currently taking   Lisinopril Cough   Sudafed  [Pseudoephedrine Hcl] Other (See Comments)    Sore throat   Sudafed [Pseudoephedrine Hcl]     Pt reports she had drainage in throat that made her throat hurt.    Home Medications: Prior to Admission medications   Medication Sig Start Date End Date Taking? Authorizing Provider  acetaminophen (TYLENOL) 500 MG tablet Take 500-1,000 mg by mouth every 6 (six) hours as needed (FOR PAIN.).    [provider]  aspirin EC 81 MG tablet Take 81 mg by mouth daily.     [provider]  azelastine (ASTELIN) 0.1 % nasal spray Place into both nostrils as needed for rhinitis. Use in each nostril as directed    [provider]  clonazePAM (KLONOPIN) 0.5 MG tablet Take 1 tablet (0.5 mg total) by mouth at bedtime. 01/20/22   [name], MD  docusate sodium (COLACE) 100 MG capsule Take 200 mg by mouth 2 (two) times daily.     [provider]  hydrocortisone  (ANUSOL-HC) 2.5 % rectal cream APPLY RECTALLY TWICE A DAY. 03/26/21   [name], MD  hydrocortisone (PROCTOZONE-HC) 2.5 % rectal cream Place 1 application rectally 2 (two) times daily. 10/09/21   [name], MD  hydrocortisone 2.5 % cream APPLY TO THE AFFECTED AREA(S) TWICE A DAY. 09/27/21  [name], MD  JARDIANCE 10 MG TABS tablet TAKE ONE TABLET BY MOUTH ONCE DAILY. 01/03/22   Larey Dresser, MD  metoCLOPramide (REGLAN) 10 MG tablet TAKE ONE TABLET BY MOUTH TWICE DAILY. 02/24/22   [name], MD  metoprolol succinate (TOPROL-XL) 50 MG 24 hr tablet Take 1 tablet (50 mg total) by mouth 2 (two) times daily. 01/29/22   Larey Dresser, MD  montelukast (SINGULAIR) 10 MG tablet TAKE ONE TABLET BY MOUTH AT BEDTIME. 09/03/21   [name], MD  pantoprazole (PROTONIX) 40 MG tablet TAKE (1) TABLET BY MOUTH TWICE DAILY. 11/18/21   [name], MD  rosuvastatin (CRESTOR) 20 MG tablet Take 1 tablet (20 mg total) by mouth daily. 01/14/22   [name], MD  sacubitril-valsartan (ENTRESTO) 24-26 MG Take 1 tablet by mouth 2 (two) times daily. 04/05/21   Larey Dresser, MD  sertraline (ZOLOFT) 25 MG tablet Take 1 tablet (25 mg total) by mouth daily. 01/20/22   [name], MD  spironolactone (ALDACTONE) 25 MG tablet TAKE 1 TABLET BY MOUTH ONCE A DAY. NEEDS APPOINTMENT FOR FUTURE FILLS. 02/10/22   Larey Dresser, MD  UNABLE TO FIND Cranial prothesis  DX C50.911 L65.9 11/01/21   [name], MD     Critical care time: 48 minutes   Lestine Mount, PA-C [facility] 02/27/22 7:36 PM  Please see [email or web address] for pager details.  From 7A-7P if no response, please call [phone] After hours, please call ELink [phone]

## 2022-02-27 NOTE — ED Provider Triage Note (Signed)
Emergency Medicine Provider Triage Evaluation Note  Tara [name] , a 76 y.o. female  was evaluated in triage.  Pt complains of aphasia.  Last known well 730 last night, went to bed, no middle the night fell out of bed.  Unsure if she hit her head.  Unable to get up, family found this morning.  Patient having expressive aphasia, no lateralized weakness.  Not on blood thinners.  Review of Systems  Per HPI  Physical Exam  There were no vitals taken for this visit. Gen:   Awake, no distress   Resp:  Normal effort  MSK:   Moves extremities without difficulty  Other:  Cranial nerves III through XII are grossly intact.  Grip strength equal bilaterally, lower extremity strength equal bilaterally.  Plantarflexion dorsiflexion the same on both sides, sensation to light touch grossly intact bilaterally.  Normal finger-nose, no pronator drift.  Medical Decision Making  Medically screening exam initiated at 2:23 PM.  Appropriate orders placed.  SAMARRAH TRANCHINA was informed that the remainder of the evaluation will be completed by another provider, this initial triage assessment does not replace that evaluation, and the importance of remaining in the ED until their evaluation is complete.   Patient is LVO negative..  Outside of stroke window, stroke work-up initiated.   Sherrill Raring, PA-C 02/27/22 1425

## 2022-02-27 NOTE — Assessment & Plan Note (Signed)
Continue protonix  

## 2022-02-27 NOTE — ED Triage Notes (Signed)
Pt states she went to bed at [place] last night and felt normal. She fell out of bed in the middle of the night and couldn't get back in the bed herself. Her husband found her on the floor this morning. Pt states she was awake the whole time. Pt's husband and dtr noticed she's slurring her speech. Pt grips equal, no headache, no facial droop.

## 2022-02-27 NOTE — ED Notes (Signed)
Patient transported to X-ray 

## 2022-02-27 NOTE — Assessment & Plan Note (Signed)
Initial CO2 <7 likely from acute on chronic kidney failure  Ordered lactic acid and vbg Nephrology starting bicarb She is not short of breath or tachypneic  vbg resulted with pH of 7.0 and ICU will be admitting Discussed with her possible need for dialysis and she is okay with this temporarily

## 2022-02-27 NOTE — Assessment & Plan Note (Signed)
No source of infection, no fevers ? Reactive Trend fever curve and cbc Will be elevated with steroids.

## 2022-02-28 ENCOUNTER — Inpatient Hospital Stay (HOSPITAL_COMMUNITY): Payer: Medicare Other

## 2022-02-28 DIAGNOSIS — E872 Acidosis, unspecified: Secondary | ICD-10-CM

## 2022-02-28 DIAGNOSIS — D429 Neoplasm of uncertain behavior of meninges, unspecified: Secondary | ICD-10-CM

## 2022-02-28 DIAGNOSIS — F32A Depression, unspecified: Secondary | ICD-10-CM

## 2022-02-28 DIAGNOSIS — I5021 Acute systolic (congestive) heart failure: Secondary | ICD-10-CM

## 2022-02-28 DIAGNOSIS — I5022 Chronic systolic (congestive) heart failure: Secondary | ICD-10-CM | POA: Diagnosis not present

## 2022-02-28 DIAGNOSIS — N179 Acute kidney failure, unspecified: Secondary | ICD-10-CM | POA: Diagnosis not present

## 2022-02-28 DIAGNOSIS — D649 Anemia, unspecified: Secondary | ICD-10-CM

## 2022-02-28 DIAGNOSIS — F419 Anxiety disorder, unspecified: Secondary | ICD-10-CM | POA: Diagnosis not present

## 2022-02-28 DIAGNOSIS — D320 Benign neoplasm of cerebral meninges: Secondary | ICD-10-CM | POA: Diagnosis not present

## 2022-02-28 LAB — GLUCOSE, CAPILLARY
Glucose-Capillary: 98 mg/dL (ref 70–99)
Glucose-Capillary: 97 mg/dL (ref 70–99)
Glucose-Capillary: 258 mg/dL — ABNORMAL HIGH (ref 70–99)
Glucose-Capillary: 247 mg/dL — ABNORMAL HIGH (ref 70–99)
Glucose-Capillary: 165 mg/dL — ABNORMAL HIGH (ref 70–99)
Glucose-Capillary: 140 mg/dL — ABNORMAL HIGH (ref 70–99)

## 2022-02-28 LAB — BASIC METABOLIC PANEL
Anion gap: 20 — ABNORMAL HIGH (ref 5–15)
Anion gap: 23 — ABNORMAL HIGH (ref 5–15)
Anion gap: 15 (ref 5–15)
BUN: 95 mg/dL — ABNORMAL HIGH (ref 8–23)
BUN: 80 mg/dL — ABNORMAL HIGH (ref 8–23)
BUN: 69 mg/dL — ABNORMAL HIGH (ref 8–23)
CO2: 8 mmol/L — ABNORMAL LOW (ref 22–32)
CO2: 11 mmol/L — ABNORMAL LOW (ref 22–32)
CO2: 20 mmol/L — ABNORMAL LOW (ref 22–32)
Calcium: 8.9 mg/dL (ref 8.9–10.3)
Calcium: 8 mg/dL — ABNORMAL LOW (ref 8.9–10.3)
Calcium: 7.9 mg/dL — ABNORMAL LOW (ref 8.9–10.3)
Chloride: 117 mmol/L — ABNORMAL HIGH (ref 98–111)
Chloride: 107 mmol/L (ref 98–111)
Chloride: 102 mmol/L (ref 98–111)
Creatinine, Ser: 5.71 mg/dL — ABNORMAL HIGH (ref 0.44–1.00)
Creatinine, Ser: 4.36 mg/dL — ABNORMAL HIGH (ref 0.44–1.00)
Creatinine, Ser: 3.47 mg/dL — ABNORMAL HIGH (ref 0.44–1.00)
GFR, Estimated: 7 mL/min — ABNORMAL LOW (ref >60)
GFR, Estimated: 10 mL/min — ABNORMAL LOW (ref >60)
GFR, Estimated: 13 mL/min — ABNORMAL LOW (ref >60)
Glucose, Bld: 100 mg/dL — ABNORMAL HIGH (ref 70–99)
Glucose, Bld: 224 mg/dL — ABNORMAL HIGH (ref 70–99)
Glucose, Bld: 171 mg/dL — ABNORMAL HIGH (ref 70–99)
Potassium: 3.5 mmol/L (ref 3.5–5.1)
Potassium: 2.9 mmol/L — ABNORMAL LOW (ref 3.5–5.1)
Potassium: 2.5 mmol/L — CL (ref 3.5–5.1)
Sodium: 145 mmol/L (ref 135–145)
Sodium: 141 mmol/L (ref 135–145)
Sodium: 137 mmol/L (ref 135–145)

## 2022-02-28 LAB — RENAL FUNCTION PANEL
Albumin: 2.9 g/dL — ABNORMAL LOW (ref 3.5–5.0)
Anion gap: 21 — ABNORMAL HIGH (ref 5–15)
BUN: 88 mg/dL — ABNORMAL HIGH (ref 8–23)
CO2: 10 mmol/L — ABNORMAL LOW (ref 22–32)
Calcium: 8.8 mg/dL — ABNORMAL LOW (ref 8.9–10.3)
Chloride: 113 mmol/L — ABNORMAL HIGH (ref 98–111)
Creatinine, Ser: 5.11 mg/dL — ABNORMAL HIGH (ref 0.44–1.00)
GFR, Estimated: 8 mL/min — ABNORMAL LOW (ref >60)
Glucose, Bld: 99 mg/dL (ref 70–99)
Phosphorus: 6.2 mg/dL — ABNORMAL HIGH (ref 2.5–4.6)
Potassium: 3.1 mmol/L — ABNORMAL LOW (ref 3.5–5.1)
Sodium: 144 mmol/L (ref 135–145)

## 2022-02-28 LAB — ECHOCARDIOGRAM COMPLETE
AR max vel: 2.71 cm2
AV Area VTI: 2.87 cm2
AV Area mean vel: 2.66 cm2
AV Mean grad: 3 mmHg
AV Peak grad: 5.7 mmHg
Ao pk vel: 1.19 m/s
Area-P 1/2: 2.35 cm2
Height: 57 in
S' Lateral: 2.9 cm
Weight: 2638.47 oz

## 2022-02-28 LAB — CBC
HCT: 25.1 % — ABNORMAL LOW (ref 36.0–46.0)
Hemoglobin: 8.6 g/dL — ABNORMAL LOW (ref 12.0–15.0)
MCH: 27.7 pg (ref 26.0–34.0)
MCHC: 34.3 g/dL (ref 30.0–36.0)
MCV: 81 fL (ref 80.0–100.0)
Platelets: 222 10*3/uL (ref 150–400)
RBC: 3.1 MIL/uL — ABNORMAL LOW (ref 3.87–5.11)
RDW: 17.8 % — ABNORMAL HIGH (ref 11.5–15.5)
WBC: 15.8 10*3/uL — ABNORMAL HIGH (ref 4.0–10.5)
nRBC: 0 % (ref 0.0–0.2)

## 2022-02-28 LAB — CREATININE, URINE, RANDOM: Creatinine, Urine: 70.6 mg/dL

## 2022-02-28 LAB — PHOSPHORUS
Phosphorus: 7.4 mg/dL — ABNORMAL HIGH (ref 2.5–4.6)
Phosphorus: 3 mg/dL (ref 2.5–4.6)

## 2022-02-28 LAB — LIPASE, BLOOD: Lipase: 67 U/L — ABNORMAL HIGH (ref 11–51)

## 2022-02-28 LAB — MAGNESIUM
Magnesium: 2.4 mg/dL (ref 1.7–2.4)
Magnesium: 2.2 mg/dL (ref 1.7–2.4)
Magnesium: 1.7 mg/dL (ref 1.7–2.4)

## 2022-02-28 LAB — SODIUM, URINE, RANDOM: Sodium, Ur: 60 mmol/L

## 2022-02-28 MED ORDER — INSULIN GLARGINE-YFGN 100 UNIT/ML ~~LOC~~ SOLN
10.0000 [IU] | Freq: Every day | SUBCUTANEOUS | Status: DC
  Administered 2022-02-28 – 2022-03-12 (×13): 10 [IU] via SUBCUTANEOUS
  Filled 2022-02-28 (×14): qty 0.1

## 2022-02-28 MED ORDER — ASPIRIN 81 MG PO CHEW
81.0000 mg | CHEWABLE_TABLET | Freq: Every day | ORAL | Status: DC
  Administered 2022-02-28 – 2022-03-03 (×4): 81 mg via ORAL
  Filled 2022-02-28 (×4): qty 1

## 2022-02-28 MED ORDER — MAGNESIUM SULFATE 2 GM/50ML IV SOLN: 2.0000 g | Freq: Once | INTRAVENOUS | Status: DC

## 2022-02-28 MED ORDER — MAGNESIUM SULFATE 2 GM/50ML IV SOLN
2.0000 g | Freq: Once | INTRAVENOUS | Status: AC
  Administered 2022-02-28: 2 g via INTRAVENOUS
  Filled 2022-02-28: qty 50

## 2022-02-28 MED ORDER — POTASSIUM CHLORIDE CRYS ER 20 MEQ PO TBCR
40.0000 meq | EXTENDED_RELEASE_TABLET | Freq: Three times a day (TID) | ORAL | Status: DC
  Administered 2022-02-28 (×3): 40 meq via ORAL
  Filled 2022-02-28 (×4): qty 2

## 2022-02-28 MED ORDER — SODIUM CHLORIDE 0.9 % IV SOLN: INTRAVENOUS | Status: DC | PRN

## 2022-02-28 MED ORDER — PERFLUTREN LIPID MICROSPHERE
1.0000 mL | INTRAVENOUS | Status: AC | PRN
  Administered 2022-02-28: 3 mL via INTRAVENOUS

## 2022-02-28 MED ORDER — POTASSIUM CHLORIDE CRYS ER 20 MEQ PO TBCR
20.0000 meq | EXTENDED_RELEASE_TABLET | Freq: Once | ORAL | Status: DC
Start: 2022-02-28 — End: 2022-02-28

## 2022-02-28 MED ORDER — POTASSIUM CHLORIDE 10 MEQ/100ML IV SOLN
10.0000 meq | INTRAVENOUS | Status: AC
  Administered 2022-02-28 (×6): 10 meq via INTRAVENOUS
  Filled 2022-02-28 (×6): qty 100

## 2022-02-28 NOTE — Progress Notes (Signed)
eLink Physician-Brief Progress Note Patient Name: Tara [name] DOB: Nov 01, 1945 MRN: [id number]   Date of Service  02/28/2022  HPI/Events of Note  Patient needs order for magnesium replacement and interval electrolytes check.  eICU Interventions  Orders entered.        Kerry Kass Jock Mahon 02/28/2022, 8:14 PM

## 2022-02-28 NOTE — Consult Note (Signed)
Reason for Consult: Meningioma  Referring Physician: Teressa Lower, MD   HPI: Tara [name] is a 76 y.o. female with a PmHx significant for breast cancer s/p chemo, radiation and lumpectomy, SVT, CHF, HTN, HLD, and meningioma who presented to the [facility] ED yesterday due to complaints of generalized weakness, fatigue, and dysarthria. The patient has a history of a meningioma but was initially not interested in pursuing surgical treatment. She reports seeing Dr. Christella Noa for her meningioma in the past. Her current symptoms began approximately 2 weeks ago and has significantly progressed over the last few days.  She has been more sleepy than usual with decreased appetite. Over the last few days, she reports that her oral intake has consisted of "diet Dr. Malachi Bonds and water." During the night of June 7th, she reports falling out of bed and being unable to get back up due to weakness. She required assistance from her husband to return to bed. She initially declined seeking medical care until the patient's husband persuaded her to go to the ED. Prior to arrival in the ED and while in the ED, she had multiple episodes of emesis. Workup in the ED revealed an AKI with a creatinine of 6.34. CT imaging of her brain revealed an interval increase of the known meningioma with local edema. [place] consultation was requested due to findings on the CT. She endorses, fatigue, headaches, nausea/vomiting, generalized weakness, and mild dysarthria. She denies vision changes, hearing changes, confusion, and seizure like activity.  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   Anxiety    Breast cancer ([place]) 2008   right - s/p lumpectomy->chemo, radiation   Depression    Dysrhythmia    hx SVT   Family history of colon cancer    Family history of prostate cancer    GERD (gastroesophageal reflux disease)    H/O: hysterectomy    Hiatal hernia    History of  cancer chemotherapy    History of radiation therapy    Hyperlipidemia    Hypertension 01/25/2018   Nonischemic cardiomyopathy ([place])    Personal history of radiation therapy 01/05/209   SVT (supraventricular tachycardia) ([place])    short RP SVT documented 2/95   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[place])    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)   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    Past Surgical History:  Procedure Laterality Date   ABDOMINAL HYSTERECTOMY  1994   fibroids,    BIOPSY  06/19/2016   Procedure: BIOPSY;  Surgeon: Daneil Dolin, MD;  [facility];  Service: Endoscopy;;  gastric duodenum   BREAST BIOPSY Right 12/16/2006   malignant   BREAST EXCISIONAL BIOPSY Right 2018   benign lumpectomy   BREAST LUMPECTOMY Right 02/2007   BREAST LUMPECTOMY WITH RADIOACTIVE SEED LOCALIZATION Right 07/29/2017   Procedure: RIGHT BREAST LUMPECTOMY WITH RADIOACTIVE SEED LOCALIZATION;  Surgeon: Fanny Skates, MD;  Location: [facility];  Service: General;  Laterality: Right;   BREAST SURGERY Right 2008   lumpectomy, cancer   CARDIAC CATHETERIZATION     CHOLECYSTECTOMY  1999   COLONOSCOPY  2008   Dr. Oneida Alar: multiple polyps. Path not available at time of visit.    COLONOSCOPY WITH PROPOFOL N/A 04/25/2014   Dr. Oneida Alar: Multiple tubular adenomas removed. Diverticulosis. Moderate internal hemorrhoids. Next colonoscopy planned for August 2018.  ESOPHAGOGASTRODUODENOSCOPY (EGD) WITH PROPOFOL N/A 06/19/2016   Procedure: ESOPHAGOGASTRODUODENOSCOPY (EGD) WITH PROPOFOL;  Surgeon: Daneil Dolin, MD;  [facility];  Service: Endoscopy;  Laterality: N/A;   MASTECTOMY W/ SENTINEL NODE BIOPSY Right 06/09/2018   Procedure: RIGHT TOTAL MASTECTOMY WITH SENTINEL LYMPH NODE BIOPSY;  Surgeon: Fanny Skates, MD;  Location: [facility];  Service: General;  Laterality: Right;   POLYPECTOMY N/A 04/25/2014   Procedure: POLYPECTOMY;  Surgeon: Danie Binder, MD;   Location: [facility];  Service: Endoscopy;  Laterality: N/A;  Ascending and Decending Colon x3 , Transverse colon x2, rectal   PORTACATH PLACEMENT N/A 06/09/2018   Procedure: INSERTION PORT-A-CATH;  Surgeon: Fanny Skates, MD;  Location: [facility] [place];  Service: General;  Laterality: N/A;   RIGHT/LEFT HEART CATH AND CORONARY ANGIOGRAPHY N/A 07/10/2017   Procedure: RIGHT/LEFT HEART CATH AND CORONARY ANGIOGRAPHY;  Surgeon: Larey Dresser, MD;  Location: [facility];  Service: Cardiovascular;  Laterality: N/A;    Family History  Problem Relation Age of Onset   Colon cancer Father 43       died at age 52   Hypertension Sister    Heart disease Sister    Cancer Sister        unknown form   Dementia Mother    Stroke Mother 61       left hemiparesis   Cancer Brother 53       prostate   Cancer Paternal Aunt        NOS   Lung cancer Paternal Uncle    Other Paternal Uncle        lightning strike   Other Paternal Uncle        hit by train   Other Paternal 58        old age   37 Cousin        NOS pat first cousin   Cancer Cousin        NOS pat first cousin   Prostate cancer Cousin        pat first cousin    Social History:  reports that she has never smoked. She has never used smokeless tobacco. She reports that she does not drink alcohol and does not use drugs.  Allergies:  Allergies  Allergen Reactions   Aspirin Other (See Comments)    Reports GI bleed--pt is currently taking   Lisinopril Cough   Sudafed [Pseudoephedrine Hcl]     Pt reports she had drainage in throat that made her throat hurt.     Medications: I have reviewed the patient's current medications.  Results for orders placed or performed during the hospital encounter of 02/27/22 (from the past 48 hour(s))  Ethanol     Status: None   Collection Time: 02/27/22  2:38 PM  Result Value Ref Range   Alcohol, Ethyl (B) <10 <10 mg/dL    Comment: (NOTE) Lowest detectable limit for serum alcohol is 10 mg/dL.  For  medical purposes only. Performed at [facility], [place] [address] [place], [place] [address]   Protime-INR     Status: None   Collection Time: 02/27/22  2:38 PM  Result Value Ref Range   Prothrombin Time 14.4 11.4 - 15.2 seconds   INR 1.1 0.8 - 1.2    Comment: (NOTE) INR goal varies based on device and disease states. Performed at [facility], [place] [address], [place], [place] [address]   APTT     Status: None  Collection Time: 02/27/22  2:38 PM  Result Value Ref Range   aPTT 25 24 - 36 seconds    Comment: Performed at [facility], [place] [address] [place], [place] [address]  CBC     Status: Abnormal   Collection Time: 02/27/22  2:38 PM  Result Value Ref Range   WBC 20.6 (H) 4.0 - 10.5 K/uL   RBC 3.78 (L) 3.87 - 5.11 MIL/uL   Hemoglobin 10.5 (L) 12.0 - 15.0 g/dL   HCT 33.1 (L) 36.0 - 46.0 %   MCV 87.6 80.0 - 100.0 fL   MCH 27.8 26.0 - 34.0 pg   MCHC 31.7 30.0 - 36.0 g/dL   RDW 19.4 (H) 11.5 - 15.5 %   Platelets 259 150 - 400 K/uL   nRBC 0.0 0.0 - 0.2 %    Comment: Performed at [facility] [address], [place], [place] [address]  Differential     Status: Abnormal   Collection Time: 02/27/22  2:38 PM  Result Value Ref Range   Neutrophils Relative % 85 %   Neutro Abs 17.6 (H) 1.7 - 7.7 K/uL   Lymphocytes Relative 8 %   Lymphs Abs 1.5 0.7 - 4.0 K/uL   Monocytes Relative 6 %   Monocytes Absolute 1.3 (H) 0.1 - 1.0 K/uL   Eosinophils Relative 0 %   Eosinophils Absolute 0.0 0.0 - 0.5 K/uL   Basophils Relative 0 %   Basophils Absolute 0.1 0.0 - 0.1 K/uL   Immature Granulocytes 1 %   Abs Immature Granulocytes 0.13 (H) 0.00 - 0.07 K/uL    Comment: Performed at [facility] [address], [place], [place] [address]  Comprehensive metabolic panel     Status: Abnormal   Collection Time: 02/27/22  2:38 PM  Result Value Ref Range   Sodium 143 135 - 145 mmol/L   Potassium 3.7 3.5 - 5.1 mmol/L   Chloride 116 (H) 98 - 111 mmol/L   CO2 <7 (L)  22 - 32 mmol/L   Glucose, Bld 120 (H) 70 - 99 mg/dL    Comment: Glucose reference range applies only to samples taken after fasting for at least 8 hours.   BUN 97 (H) 8 - 23 mg/dL   Creatinine, Ser 6.34 (H) 0.44 - 1.00 mg/dL   Calcium 9.0 8.9 - 10.3 mg/dL   Total Protein 7.2 6.5 - 8.1 g/dL   Albumin 3.5 3.5 - 5.0 g/dL   AST 34 15 - 41 U/L   ALT 22 0 - 44 U/L   Alkaline Phosphatase 78 38 - 126 U/L   Total Bilirubin 0.6 0.3 - 1.2 mg/dL   GFR, Estimated 6 (L) >60 mL/min    Comment: (NOTE) Calculated using the CKD-EPI Creatinine Equation (2021)    Anion gap NOT CALCULATED 5 - 15    Comment: Performed at [facility] [address], [place], [place] [address]  Troponin I (High Sensitivity)     Status: Abnormal   Collection Time: 02/27/22  2:38 PM  Result Value Ref Range   Troponin I (High Sensitivity) 26 (H) <18 ng/L    Comment: (NOTE) Elevated high sensitivity troponin I (hsTnI) values and significant  changes across serial measurements may suggest ACS but many other  chronic and acute conditions are known to elevate hsTnI results.  Refer to the "Links" section for chest pain algorithms and additional  guidance. Performed at [facility], [place] [address] [place], [place]  [address]   I-stat chem 8, ED     Status: Abnormal   Collection Time: 02/27/22  3:12 PM  Result Value Ref Range   Sodium 142 135 - 145 mmol/L   Potassium 3.6 3.5 - 5.1 mmol/L   Chloride 126 (H) 98 - 111 mmol/L   BUN 94 (H) 8 - 23 mg/dL   Creatinine, Ser 7.60 (H) 0.44 - 1.00 mg/dL   Glucose, Bld 114 (H) 70 - 99 mg/dL    Comment: Glucose reference range applies only to samples taken after fasting for at least 8 hours.   Calcium, Ion 1.04 (L) 1.15 - 1.40 mmol/L   TCO2 8 (L) 22 - 32 mmol/L   Hemoglobin 10.5 (L) 12.0 - 15.0 g/dL   HCT 31.0 (L) 36.0 - 46.0 %  Resp Panel by RT-PCR (Flu A&B, Covid) Anterior Nasal Swab     Status: None   Collection Time: 02/27/22  4:59 PM   Specimen: Anterior Nasal Swab   Result Value Ref Range   SARS Coronavirus 2 by RT PCR NEGATIVE NEGATIVE    Comment: (NOTE) SARS-CoV-2 target nucleic acids are NOT DETECTED.  The SARS-CoV-2 RNA is generally detectable in upper respiratory specimens during the acute phase of infection. The lowest concentration of SARS-CoV-2 viral copies this assay can detect is 138 copies/mL. A negative result does not preclude SARS-Cov-2 infection and should not be used as the sole basis for treatment or other patient management decisions. A negative result may occur with  improper specimen collection/handling, submission of specimen other than nasopharyngeal swab, presence of viral mutation(s) within the areas targeted by this assay, and inadequate number of viral copies(<138 copies/mL). A negative result must be combined with clinical observations, patient history, and epidemiological information. The expected result is Negative.  Fact Sheet for Patients:  EntrepreneurPulse.com.au  Fact Sheet for Healthcare Providers:  IncredibleEmployment.be  This test is no t yet approved or cleared by the [place] FDA and  has been authorized for detection and/or diagnosis of SARS-CoV-2 by FDA under an Emergency Use Authorization (EUA). This EUA will remain  in effect (meaning this test can be used) for the duration of the COVID-19 declaration under Section 564(b)(1) of the Act, 21 U.S.C.section 360bbb-3(b)(1), unless the authorization is terminated  or revoked sooner.       Influenza A by PCR NEGATIVE NEGATIVE   Influenza B by PCR NEGATIVE NEGATIVE    Comment: (NOTE) The Xpert Xpress SARS-CoV-2/FLU/RSV plus assay is intended as an aid in the diagnosis of influenza from Nasopharyngeal swab specimens and should not be used as a sole basis for treatment. Nasal washings and aspirates are unacceptable for Xpert Xpress SARS-CoV-2/FLU/RSV testing.  Fact Sheet for  Patients: EntrepreneurPulse.com.au  Fact Sheet for Healthcare Providers: IncredibleEmployment.be  This test is not yet approved or cleared by the [place] FDA and has been authorized for detection and/or diagnosis of SARS-CoV-2 by FDA under an Emergency Use Authorization (EUA). This EUA will remain in effect (meaning this test can be used) for the duration of the COVID-19 declaration under Section 564(b)(1) of the Act, 21 U.S.C. section 360bbb-3(b)(1), unless the authorization is terminated or revoked.  Performed at [facility], [place] [address], [place], [place] [address]   Hemoglobin A1c     Status: Abnormal   Collection Time: 02/27/22  4:59 PM  Result Value Ref Range   Hgb A1c MFr Bld 6.3 (H) 4.8 - 5.6 %    Comment: (NOTE) Pre diabetes:  5.7%-6.4%  Diabetes:              >6.4%  Glycemic control for   <7.0% adults with diabetes    Mean Plasma Glucose 134.11 mg/dL    Comment: Performed at [facility] [address], [place], [place] [address]  Brain natriuretic peptide     Status: Abnormal   Collection Time: 02/27/22  4:59 PM  Result Value Ref Range   B Natriuretic Peptide 157.6 (H) 0.0 - 100.0 pg/mL    Comment: Performed at [facility] [address] [place], [place] [address]  Lactic acid, plasma     Status: None   Collection Time: 02/27/22  6:30 PM  Result Value Ref Range   Lactic Acid, Venous 1.2 0.5 - 1.9 mmol/L    Comment: Performed at [facility] [address], [place], [place] [address]  I-stat chem 8, ed     Status: Abnormal   Collection Time: 02/27/22  6:51 PM  Result Value Ref Range   Sodium 145 135 - 145 mmol/L   Potassium 3.7 3.5 - 5.1 mmol/L   Chloride 122 (H) 98 - 111 mmol/L   BUN 105 (H) 8 - 23 mg/dL   Creatinine, Ser 6.80 (H) 0.44 - 1.00 mg/dL   Glucose, Bld 103 (H) 70 - 99 mg/dL    Comment: Glucose reference range applies only to samples taken after fasting for at least  8 hours.   Calcium, Ion 1.25 1.15 - 1.40 mmol/L   TCO2 11 (L) 22 - 32 mmol/L   Hemoglobin 9.5 (L) 12.0 - 15.0 g/dL   HCT 28.0 (L) 36.0 - 46.0 %  I-Stat venous blood gas, ([facility] ED only)     Status: Abnormal   Collection Time: 02/27/22  7:14 PM  Result Value Ref Range   pH, Ven 7.065 (LL) 7.25 - 7.43   pCO2, Ven 32.2 (L) 44 - 60 mmHg   pO2, Ven 104 (H) 32 - 45 mmHg   Bicarbonate 9.2 (L) 20.0 - 28.0 mmol/L   TCO2 10 (L) 22 - 32 mmol/L   O2 Saturation 95 %   Acid-base deficit 20.0 (H) 0.0 - 2.0 mmol/L   Sodium 146 (H) 135 - 145 mmol/L   Potassium 3.8 3.5 - 5.1 mmol/L   Calcium, Ion 1.27 1.15 - 1.40 mmol/L   HCT 28.0 (L) 36.0 - 46.0 %   Hemoglobin 9.5 (L) 12.0 - 15.0 g/dL   Sample type VENOUS    Comment NOTIFIED PHYSICIAN   Glucose, capillary     Status: Abnormal   Collection Time: 02/27/22  9:24 PM  Result Value Ref Range   Glucose-Capillary 103 (H) 70 - 99 mg/dL    Comment: Glucose reference range applies only to samples taken after fasting for at least 8 hours.  MRSA Next Gen by PCR, Nasal     Status: None   Collection Time: 02/27/22  9:30 PM   Specimen: Nasal Mucosa; Nasal Swab  Result Value Ref Range   MRSA by PCR Next Gen NOT DETECTED NOT DETECTED    Comment: (NOTE) The GeneXpert MRSA Assay (FDA approved for NASAL specimens only), is one component of a comprehensive MRSA colonization surveillance program. It is not intended to diagnose MRSA infection nor to guide or monitor treatment for MRSA infections. Test performance is not FDA approved in patients less than 23 years old. Performed at [facility], [place] [address], [place], [place] [address]   Blood gas, venous (  at [facility] and [facility], not at [facility])     Status: Abnormal   Collection Time: 02/27/22  9:33 PM  Result Value Ref Range   pH, Ven 7.19 (LL) 7.25 - 7.43    Comment: CRITICAL RESULT CALLED TO, READ BACK BY AND VERIFIED WITH: L JONES RN ON 02/27/22'@2234'$  BY CHAYES    pCO2, Ven 25 (L) 44 - 60 mmHg   pO2, Ven <31 (LL) 32  - 45 mmHg   Bicarbonate 9.3 (L) 20.0 - 28.0 mmol/L   Acid-base deficit 17.4 (H) 0.0 - 2.0 mmol/L   O2 Saturation 32.6 %   Patient temperature 36.6     Comment: Performed at [facility] [address], [place], [place] [address]  Lactic acid, plasma     Status: None   Collection Time: 02/27/22  9:56 PM  Result Value Ref Range   Lactic Acid, Venous 1.7 0.5 - 1.9 mmol/L    Comment: Performed at [facility] [address], [place], [place] [address]  Troponin I (High Sensitivity)     Status: Abnormal   Collection Time: 02/27/22  9:56 PM  Result Value Ref Range   Troponin I (High Sensitivity) 30 (H) <18 ng/L    Comment: (NOTE) Elevated high sensitivity troponin I (hsTnI) values and significant  changes across serial measurements may suggest ACS but many other  chronic and acute conditions are known to elevate hsTnI results.  Refer to the "Links" section for chest pain algorithms and additional  guidance. Performed at [facility], [place] [address] [place], [place] [address]   CK     Status: Abnormal   Collection Time: 02/27/22  9:56 PM  Result Value Ref Range   Total CK 1,062 (H) 38 - 234 U/L    Comment: Performed at [facility], [place] [address] [place], [place] [address]  Basic metabolic panel     Status: Abnormal   Collection Time: 02/27/22  9:56 PM  Result Value Ref Range   Sodium 145 135 - 145 mmol/L   Potassium 3.5 3.5 - 5.1 mmol/L   Chloride 117 (H) 98 - 111 mmol/L   CO2 8 (L) 22 - 32 mmol/L   Glucose, Bld 100 (H) 70 - 99 mg/dL    Comment: Glucose reference range applies only to samples taken after fasting for at least 8 hours.   BUN 95 (H) 8 - 23 mg/dL   Creatinine, Ser 5.71 (H) 0.44 - 1.00 mg/dL   Calcium 8.9 8.9 - 10.3 mg/dL   GFR, Estimated 7 (L) >60 mL/min    Comment: (NOTE) Calculated using the CKD-EPI Creatinine Equation (2021)    Anion gap 20 (H) 5 - 15    Comment: Performed at [facility] [address], [place], [place]  [address]  Magnesium     Status: None   Collection Time: 02/27/22  9:56 PM  Result Value Ref Range   Magnesium 2.4 1.7 - 2.4 mg/dL    Comment: Performed at [facility], [place] [address], [place], [place] [address]  Phosphorus     Status: Abnormal   Collection Time: 02/27/22  9:56 PM  Result Value Ref Range   Phosphorus 7.4 (H) 2.5 - 4.6 mg/dL    Comment: Performed at [facility] [address], [place],  [address]  Procalcitonin     Status: None   Collection Time: 02/27/22  9:56 PM  Result Value Ref Range   Procalcitonin  0.80 ng/mL    Comment:        Interpretation: PCT > 0.5 ng/mL and <= 2 ng/mL: Systemic infection (sepsis) is possible, but other conditions are known to elevate PCT as well. (NOTE)       Sepsis PCT Algorithm           Lower Respiratory Tract                                      Infection PCT Algorithm    ----------------------------     ----------------------------         PCT < 0.25 ng/mL                PCT < 0.10 ng/mL          Strongly encourage             Strongly discourage   discontinuation of antibiotics    initiation of antibiotics    ----------------------------     -----------------------------       PCT 0.25 - 0.50 ng/mL            PCT 0.10 - 0.25 ng/mL               OR       >80% decrease in PCT            Discourage initiation of                                            antibiotics      Encourage discontinuation           of antibiotics    ----------------------------     -----------------------------         PCT >= 0.50 ng/mL              PCT 0.26 - 0.50 ng/mL                AND       <80% decrease in PCT             Encourage initiation of                                             antibiotics       Encourage continuation           of antibiotics    ----------------------------     -----------------------------        PCT >= 0.50 ng/mL                  PCT > 0.50 ng/mL               AND         increase in PCT                   Strongly encourage                                      initiation of antibiotics    Strongly encourage escalation  of antibiotics                                     -----------------------------                                           PCT <= 0.25 ng/mL                                                 OR                                        > 80% decrease in PCT                                      Discontinue / Do not initiate                                             antibiotics  Performed at [facility], [place] [address], [place], [place] [address]   Lipase, blood     Status: Abnormal   Collection Time: 02/27/22  9:56 PM  Result Value Ref Range   Lipase 67 (H) 11 - 51 U/L    Comment: Performed at [facility] [address], [place], [place] [address]  CBC     Status: Abnormal   Collection Time: 02/27/22  9:56 PM  Result Value Ref Range   WBC 17.7 (H) 4.0 - 10.5 K/uL   RBC 3.33 (L) 3.87 - 5.11 MIL/uL   Hemoglobin 9.5 (L) 12.0 - 15.0 g/dL   HCT 28.2 (L) 36.0 - 46.0 %   MCV 84.7 80.0 - 100.0 fL   MCH 28.5 26.0 - 34.0 pg   MCHC 33.7 30.0 - 36.0 g/dL   RDW 18.6 (H) 11.5 - 15.5 %   Platelets 240 150 - 400 K/uL   nRBC 0.0 0.0 - 0.2 %    Comment: Performed at [facility], [place] [address], [place],  [address]  Glucose, capillary     Status: None   Collection Time: 02/27/22 11:07 PM  Result Value Ref Range   Glucose-Capillary 88 70 - 99 mg/dL    Comment: Glucose reference range applies only to samples taken after fasting for at least 8 hours.  Renal function panel     Status: Abnormal   Collection Time: 02/28/22  1:13 AM  Result Value Ref Range   Sodium 144 135 - 145 mmol/L   Potassium 3.1 (L) 3.5 - 5.1 mmol/L   Chloride 113 (H) 98 - 111 mmol/L   CO2 10 (L) 22 - 32 mmol/L   Glucose, Bld 99 70 - 99 mg/dL    Comment: Glucose reference range applies only to samples taken after fasting for at least 8 hours.   BUN 88 (H) 8 - 23 mg/dL    Creatinine, Ser 5.11 (  H) 0.44 - 1.00 mg/dL   Calcium 8.8 (L) 8.9 - 10.3 mg/dL   Phosphorus 6.2 (H) 2.5 - 4.6 mg/dL   Albumin 2.9 (L) 3.5 - 5.0 g/dL   GFR, Estimated 8 (L) >60 mL/min    Comment: (NOTE) Calculated using the CKD-EPI Creatinine Equation (2021)    Anion gap 21 (H) 5 - 15    Comment: REPEATED TO VERIFY Performed at [facility] [address], [place], [place] [address]   Magnesium     Status: None   Collection Time: 02/28/22  1:13 AM  Result Value Ref Range   Magnesium 2.2 1.7 - 2.4 mg/dL    Comment: Performed at [facility] [address], [place], [place] [address]  CBC     Status: Abnormal   Collection Time: 02/28/22  1:38 AM  Result Value Ref Range   WBC 15.8 (H) 4.0 - 10.5 K/uL   RBC 3.10 (L) 3.87 - 5.11 MIL/uL   Hemoglobin 8.6 (L) 12.0 - 15.0 g/dL   HCT 25.1 (L) 36.0 - 46.0 %   MCV 81.0 80.0 - 100.0 fL   MCH 27.7 26.0 - 34.0 pg   MCHC 34.3 30.0 - 36.0 g/dL   RDW 17.8 (H) 11.5 - 15.5 %   Platelets 222 150 - 400 K/uL   nRBC 0.0 0.0 - 0.2 %    Comment: Performed at [facility], [place] [address], [place], [place] [address]  Glucose, capillary     Status: None   Collection Time: 02/28/22  3:10 AM  Result Value Ref Range   Glucose-Capillary 98 70 - 99 mg/dL    Comment: Glucose reference range applies only to samples taken after fasting for at least 8 hours.    US RENAL  Result Date: 02/27/2022 CLINICAL DATA:  Acute kidney injury EXAM: RENAL / URINARY TRACT ULTRASOUND COMPLETE COMPARISON:  None Available. FINDINGS: Right Kidney: Renal measurements: 8.9 x 3.8 x 3.6 cm = volume: 62.86 mL. Mildly increased renal cortical echogenicity. No mass or hydronephrosis visualized. Left Kidney: Renal measurements: 7.8 x 5.1 x 4.3 cm = volume: 87.75 mL. Mildly increased renal cortical echogenicity. No mass or hydronephrosis visualized. Bladder: Appears normal for degree of bladder distention. Other: None. IMPRESSION: No hydronephrosis.  Findings of medical  renal disease. Electronically Signed   By: Maurine Simmering M.D.   On: 02/27/2022 18:44   DG Chest 2 View  Result Date: 02/27/2022 CLINICAL DATA:  Dyspnea on exertion. EXAM: CHEST - 2 VIEW COMPARISON:  Chest x-ray 06/09/2018. FINDINGS: Heart is mildly enlarged, unchanged. Calcified right mediastinal and right hilar lymph nodes are again seen. There is mild central pulmonary vascular congestion. There is no focal lung infiltrate, pleural effusion or pneumothorax. No acute fractures are seen. Right axillary surgical clips are present. IMPRESSION: 1. No acute cardiopulmonary process. 2. Mild cardiomegaly. 3. Old granulomatous disease. Electronically Signed   By: Ronney Asters M.D.   On: 02/27/2022 18:07   CT HEAD WO CONTRAST  Result Date: 02/27/2022 CLINICAL DATA:  Slurred speech weakness EXAM: CT HEAD WITHOUT CONTRAST TECHNIQUE: Contiguous axial images were obtained from the base of the skull through the vertex without intravenous contrast. RADIATION DOSE REDUCTION: This exam was performed according to the departmental dose-optimization program which includes automated exposure control, adjustment of the mA and/or kV according to patient size and/or use of iterative reconstruction technique. COMPARISON:  09/28/2016 FINDINGS: Brain: Redemonstrated planum sphenoidale meningioma, which is not well evaluated given the absence of intravenous  contrast, which measures approximately 3.3 x 2.9 x 2.4 cm cm (AP x TR x CC) (series 6, image 30 and series 5, image 21), previously 3.4 x 2.5 x 2.1 cm on 09/28/2016 when remeasured similarly. Hypodensity in the adjacent left frontal lobe (series 5, image 17 and series 3, image 12), which is favored to represent the sequela of remote infarct but could also represent edema associated with the mass. No evidence of acute infarction, hemorrhage, or midline shift. No hydrocephalus or extra-axial fluid collection. Vascular: No hyperdense vessel. Atherosclerotic calcifications in the  intracranial carotid and vertebral arteries. Skull: Normal. Negative for fracture or focal lesion. Sinuses/Orbits: Mucosal thickening in the maxillary sinuses. The orbits are unremarkable. Other: The mastoid air cells are well aerated. IMPRESSION: 1. Interval increase in the size of a previously noted planum sphenoidale meningioma, with interval development of hypodensity in the inferior left frontal lobe, favored to represent sequela of remote infarct but possibly also representing edema related to the mass. An MRI with and without contrast is recommended for further evaluation. 2. No evidence of acute infarct. These results were called by telephone at the time of interpretation on 02/27/2022 at 3:51 pm to provider [place] , who verbally acknowledged these results. Electronically Signed   By: Merilyn Baba M.D.   On: 02/27/2022 15:51   CT Cervical Spine Wo Contrast  Result Date: 02/27/2022 CLINICAL DATA:  Neck trauma, after a fall off the bed) EXAM: CT CERVICAL SPINE WITHOUT CONTRAST TECHNIQUE: Multidetector CT imaging of the cervical spine was performed without intravenous contrast. Multiplanar CT image reconstructions were also generated. RADIATION DOSE REDUCTION: This exam was performed according to the departmental dose-optimization program which includes automated exposure control, adjustment of the mA and/or kV according to patient size and/or use of iterative reconstruction technique. COMPARISON:  None Available. FINDINGS: Alignment: Minimal retrolisthesis at C4-C5. Skull base and vertebrae: No acute fracture. No primary bone lesion or focal pathologic process. Moderate cervical spondylosis with prominent marginal osteophytes, most severe at C4-C5. Posterior osteophyte ridge causes abutment of the ventral cervical cord. Soft tissues and spinal canal: No prevertebral fluid or swelling. No visible canal hematoma. Disc levels:  There is some narrowing of the disc space at C4-C5. Upper chest: Negative.  Other: None. IMPRESSION: No fracture, subluxation or dislocation. Moderate cervical spondylosis most prominent at C4-C5 with minimal retrolisthesis, prominent marginal osteophytes and decrease in the height of the disc space. Electronically Signed   By: Frazier Richards M.D.   On: 02/27/2022 15:41    ROS: Per HPI Blood pressure 124/62, pulse 65, temperature (!) 97.3 F (36.3 C), temperature source Oral, resp. rate 19, height '4\' 9"'$  (1.448 m), weight 74.8 kg, SpO2 96 %. Physical Exam: Patient is awake, A/O X 4, conversant, and in good spirits. Eyes open spontaneously. They are in NAD and VSS. Speech is fluent and slightly dysarthric. BUE 4-/5 throughout, BLE 2/5 proximally, 3/5 distally. Sensation to light touch is intact. PERLA, EOMI. CNs grossly intact.      Assessment/Plan: 76 y.o. female with h/o of meningioma who has had increased fatigue, emesis, and generalized weakness that has been progressively worsening. Her initial workup revealed increase in her meningioma and an AKI which necessitated admission to the ICU with significant acidosis likely secondary to new renal failure. CT head and CT cervical spine were reviewed. The patient's cervical spine revealed arthritic and degenerative changes with a mild C4 on C5 retrolisthesis without any acute abnormalities appreciated. Her CT head revealed an interval increase in  her known planum sphenoidale meningioma, with interval development of localized edema. Her neurological examination revealed generalized weakness, BLE weakness > BUE weakness. The patient will likely need surgical intervention. An MRI brain with and without contrast will need to be obtained. We will hold off on the MRI at this time in the setting of her AKI. Once her kidney function improves, can proceed with MRI. Potential surgical resection next week.    -MRI brain w/ and w/o contrast once kidney function improves -Potential surgical intervention next week -Decadron 4 mg  [place], DNP, AGNP-C Neurosurgery Nurse Practitioner  [facility] [address], [place], [place] [address] P: [phone]    F: [phone]  02/28/2022 5:52 AM

## 2022-02-28 NOTE — TOC Initial Note (Signed)
Transition of Care [facility]) - Initial/Assessment Note    Patient Details  Name: Tara [name] MRN: [id number] Date of Birth: 01-Sep-1946  [facility]) CM/SW Contact:    Tom-Johnson, Renea Ee, RN Phone Number: 02/28/2022, 3:57 PM  Clinical Narrative:                  CM spoke with patient and spouse at bedside about needs for post hospital transition. Admitted for ARF on CKD. Nephrology following, possible starting dialysis if there is no Renal improvement.    Neurology following for Meningioma, outpatient followup.  Patient is from home with husband. Has two supportive children. Husband transports to and from appointments. Does not have DME's at home.  PCP is [name], MD and uses [facility] in [place].  Awaiting PT/OT eval for recommendations and needs. CM will continue to follow with needs.    Barriers to Discharge: Continued Medical Work up   Patient Goals and CMS Choice Patient states their goals for this hospitalization and ongoing recovery are:: To return home CMS Medicare.gov Compare Post Acute Care list provided to:: Patient Choice offered to / list presented to : (P) Patient  Expected Discharge Plan and Services     Discharge Planning Services: CM Consult   Living arrangements for the past 2 months: Single Family Home                                      Prior Living Arrangements/Services Living arrangements for the past 2 months: Single Family Home Lives with:: Spouse Patient language and need for interpreter reviewed:: Yes Do you feel safe going back to the place where you live?: Yes      Need for Family Participation in Patient Care: Yes (Comment) Care giver support system in place?: Yes (comment)   Criminal Activity/Legal Involvement Pertinent to Current Situation/Hospitalization: No - Comment as needed  Activities of Daily Living      Permission Sought/Granted Permission sought to share information with :  Case Manager, Customer service manager, Family Supports Permission granted to share information with : Yes, Verbal Permission Granted              Emotional Assessment Appearance:: Appears stated age Attitude/Demeanor/Rapport: Engaged, Gracious Affect (typically observed): Accepting, Appropriate, Calm, Hopeful Orientation: : Oriented to Self, Oriented to Place, Oriented to  Time, Oriented to Situation Alcohol / Substance Use: Not Applicable Psych Involvement: No (comment)  Admission diagnosis:  AKI (acute kidney injury) ([place]) [N17.9] Acute renal failure (ARF) ([place]) [N17.9] Patient Active Problem List   Diagnosis Date Noted   Metabolic acidosis 71/24/5809   Acute renal failure superimposed on stage 3b chronic kidney disease ([place]) 02/27/2022   Anxiety and depression 02/27/2022   Meningioma, cerebral ([place]) 02/27/2022   Normocytic anemia 02/27/2022   Acute renal failure (ARF) ([place]) 02/27/2022   Hemorrhoids 01/04/2021   Ankle deformity, right 09/12/2020   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   Port-A-Cath in place 09/28/2018   Colon adenomas 08/06/2018   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 ([place]) 06/09/2018   Hypertension 01/25/2018   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   Demand ischemia ([place])    Prolonged Q-T interval on ECG 09/29/2016   Chronic kidney disease (CKD), stage III (moderate) (HCC) 09/29/2016   Leukocytosis 09/18/2016   Abnormal CT scan, chest    Upper airway cough syndrome  vs Cough variant asthma  04/09/2016   Seasonal allergies 08/08/2014   Family history of colon cancer 03/30/2014   Osteoporosis 17/61/6073   Metabolic syndrome X 71/02/2693   SVT (supraventricular tachycardia) ([place]) 85/46/2703   Chronic systolic CHF (congestive heart failure) ([place]) 03/14/2013   Vitamin D deficiency 07/23/2010   Morbid obesity ([place]) 07/04/2009   Malignant neoplasm of right breast ([place]) 12/13/2007   Hyperlipidemia 12/13/2007   Anxiety  state 12/13/2007   Depression, major, single episode, in partial remission ([place]) 12/13/2007   GERD 12/13/2007   PCP:  [name], MD Pharmacy:   [facility], [address] [place] [address] Phone: [phone] Fax: [phone]     Social Determinants of Health (SDOH) Interventions    Readmission Risk Interventions     No data to display

## 2022-02-28 NOTE — Progress Notes (Signed)
NAME:  Tara [name], MRN:  [id number], DOB:  07-06-1946, LOS: 1 ADMISSION DATE:  02/27/2022 CONSULTATION DATE:  02/27/2022 REFERRING MD:  [place] - EDP CHIEF COMPLAINT:  Acute renal failure   History of Present Illness:  76 year old woman who presented to [facility] ED 6/8 with weakness, confusion and progressive worsening of slurred speech. Reportedly fell out of bed overnight and was unable to get back into bed unassisted. Found on the floor 6/8AM, alert. Family reports fatigue and progressive confusion/slurred speech for the last 1-2 weeks. 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   On ED arrival, patient was noted to have slurred speech without HA, unilateral strength deficit or facial droop. Reported vomiting over the last 1-2 days, more episodes today. Denies fever/chills, CP/SOB, cough/rhinorrhea, diarrhea, dysuria, HA. Endorses generalized weakness and constipation as well as urinary urgency. Afebrile on ED arrival with HR 65, BP soft 91/51 (MAP 64). Labs notable for WBC 20.6 (85% N), Na 143, K 3.7, bicarb < 7, Cr 6.34 (baseline 1.5-1.7), BUN 97; LFTs WNL. Trop mildly elevated at 26, BNP 157. LA 1.2. VBG with pH 7.06, bicarb 9.2. CXR demonstrating old granulomatous disease (remote TB), Renal US without hydronephrosis. CT C-spine negative. CT Head showed interval increase in known planum sphenoidale meningioma with sequelae of ?edema r/t mass. NSGY was consulted and dexamethasone initiated. Unable to obtain contrasted MRI at present due to ARF. Nephrology consulted for ARF with aim to manage without RRT.  PCCM consulted for ICU admission in the setting of acidosis and ARF.  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   Anxiety     Breast cancer ([place]) 2008   right - s/p lumpectomy->chemo, radiation   Depression    Dysrhythmia    hx SVT   Family history of colon cancer    Family history of prostate cancer    GERD (gastroesophageal reflux disease)    H/O: hysterectomy    Hiatal hernia    History of cancer chemotherapy    History of radiation therapy    Hyperlipidemia    Hypertension 01/25/2018   Nonischemic cardiomyopathy ([place])    Personal history of radiation therapy 01/05/209   SVT (supraventricular tachycardia) (HCC)    short RP SVT documented 0/86   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[place])    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 Progressive weakness, fatigue, slurred speech over 1-2 weeks. Vomiting x 1-2 days. CT Head with increased size of known meningioma with ?sequelae of edema r/t mass. NSGY consulted. ARF suspected to be prerenal, Nephro consulted. PCCM consulted for admission.  Interim History / Subjective:  Subjectively feels a little better compared to when first admitted. Had only 225cc UOP overnight. SCr improved, down to 5.11 from 6.3.  Objective:  Blood pressure 132/70, pulse 69, temperature (!) 97.2 F (36.2 C), temperature source Axillary, resp. rate (!) 21, height '4\' 9"'$  (1.448 m), weight 74.8 kg, SpO2 96 %.  Intake/Output Summary (Last 24 hours) at 02/28/2022 0902 Last data filed at 02/28/2022 0800 Gross per 24 hour  Intake 3892.3 ml  Output 225 ml  Net 3667.3 ml   Filed Weights   02/27/22 2123 02/28/22 0500  Weight: 74.8 kg 74.8 kg    Physical Examination: General: Adult female, resting in bed, in NAD. Neuro: A&O x 3, no deficits. HEENT: [place]/AT. Sclerae anicteric. EOMI. Cardiovascular: RRR, no  M/R/G.  Lungs: Respirations even and unlabored.  CTA bilaterally, No W/R/R. Abdomen: BS x 4, soft, NT/ND.  Musculoskeletal: No gross deformities, no edema.  Skin: Intact, warm, no rashes.   Assessment & Plan:   Acute renal failure, likely prerenal in the setting of dehydration + multiple nephrotoxins (Entresto, Aldactone, Jardiance, Ibuprofen).  Renal US neg for hydro. Metabolic Acidosis - 2/2 above Hypokalemia Mild rhabdo - Nephrology following, appreciate the assistance - Continue bicarb gtt, hoping to see continued improvement in renal function and avoidance of RRT - Gentle K supplementation - Follow BMP  Leukocytosis - improving and hx and workup without any indication of infection. Suspect reactive.  - Supportive care.  Meningioma of planum sphenoidale - CT Head 6/8 with interval increase of previously noted planum sphenoidale meningioma, interval development of hypodensity in the inferior left frontal lobe, favored to represent sequela of remote infarct but possibly also representing edema related to the mass. - NSGY consulted, appreciate recommendations - Continue Decadron '4mg'$  Q6H. - MRI Brain with/without contrast when safe from renal function standpoint and hopefully in next few days as pt interested in pursuing surgical options this admission if possible. - Per NSGY, if unable to obtain MRI soon then can set her up for outpatient follow up in the next few weeks.  Nausea with vomiting History of constipation Likely multifactorial in the setting of ARF, ?meningioma. - Decadron as above - Antiemetics - Fluid resuscitation  Hx sCHF (Echo 03/2021 EF 55%, moderately elevated PASP, RVSP 54.6), NICM, HTN, HLD. - Monitor volume status, I&Os - F/u on Echo - Continue home ASA - Hold Entresto, Aldactone and Jardiance, Toprol-XL, statin in the setting of ARF  Hx GERD - Continue PPI  Hx Prediabetes (A1c 6.3%.) - Holding Jardiance in the setting of ARF - SSI - CBGs  Q4H  Depression/anxiety - Hold home Zoloft, Klonipin for now  [employer]: (right click and "Reselect all SmartList Selections" daily)   Diet/type: clear liquids DVT prophylaxis: prophylactic heparin  GI prophylaxis: PPI Lines: N/A [place]:  N/A Code Status:  full code Last date of multidisciplinary goals of care discussion [Pending]  Critical care time: 35 minutes    Montey Hora, PA - [facility] For pager details, please see AMION or use Epic chat  After 1900, please call [facility] for cross coverage needs 02/28/2022, 9:27 AM

## 2022-03-01 DIAGNOSIS — D320 Benign neoplasm of cerebral meninges: Secondary | ICD-10-CM | POA: Diagnosis not present

## 2022-03-01 DIAGNOSIS — I5022 Chronic systolic (congestive) heart failure: Secondary | ICD-10-CM | POA: Diagnosis not present

## 2022-03-01 DIAGNOSIS — K219 Gastro-esophageal reflux disease without esophagitis: Secondary | ICD-10-CM | POA: Diagnosis not present

## 2022-03-01 DIAGNOSIS — N179 Acute kidney failure, unspecified: Secondary | ICD-10-CM | POA: Diagnosis not present

## 2022-03-01 LAB — BASIC METABOLIC PANEL
Anion gap: 12 (ref 5–15)
Anion gap: 12 (ref 5–15)
BUN: 60 mg/dL — ABNORMAL HIGH (ref 8–23)
BUN: 65 mg/dL — ABNORMAL HIGH (ref 8–23)
CO2: 23 mmol/L (ref 22–32)
CO2: 26 mmol/L (ref 22–32)
Calcium: 7.5 mg/dL — ABNORMAL LOW (ref 8.9–10.3)
Calcium: 7.7 mg/dL — ABNORMAL LOW (ref 8.9–10.3)
Chloride: 102 mmol/L (ref 98–111)
Chloride: 98 mmol/L (ref 98–111)
Creatinine, Ser: 2.92 mg/dL — ABNORMAL HIGH (ref 0.44–1.00)
Creatinine, Ser: 3.26 mg/dL — ABNORMAL HIGH (ref 0.44–1.00)
GFR, Estimated: 14 mL/min — ABNORMAL LOW (ref >60)
GFR, Estimated: 16 mL/min — ABNORMAL LOW (ref >60)
Glucose, Bld: 122 mg/dL — ABNORMAL HIGH (ref 70–99)
Glucose, Bld: 132 mg/dL — ABNORMAL HIGH (ref 70–99)
Potassium: 2.8 mmol/L — ABNORMAL LOW (ref 3.5–5.1)
Potassium: 3.7 mmol/L (ref 3.5–5.1)
Sodium: 136 mmol/L (ref 135–145)
Sodium: 137 mmol/L (ref 135–145)

## 2022-03-01 LAB — CBC
HCT: 22.4 % — ABNORMAL LOW (ref 36.0–46.0)
Hemoglobin: 7.8 g/dL — ABNORMAL LOW (ref 12.0–15.0)
MCH: 27.3 pg (ref 26.0–34.0)
MCHC: 34.8 g/dL (ref 30.0–36.0)
MCV: 78.3 fL — ABNORMAL LOW (ref 80.0–100.0)
Platelets: 204 10*3/uL (ref 150–400)
RBC: 2.86 MIL/uL — ABNORMAL LOW (ref 3.87–5.11)
RDW: 16.1 % — ABNORMAL HIGH (ref 11.5–15.5)
WBC: 17.1 10*3/uL — ABNORMAL HIGH (ref 4.0–10.5)
nRBC: 0.2 % (ref 0.0–0.2)

## 2022-03-01 LAB — GLUCOSE, CAPILLARY
Glucose-Capillary: 133 mg/dL — ABNORMAL HIGH (ref 70–99)
Glucose-Capillary: 125 mg/dL — ABNORMAL HIGH (ref 70–99)
Glucose-Capillary: 152 mg/dL — ABNORMAL HIGH (ref 70–99)
Glucose-Capillary: 138 mg/dL — ABNORMAL HIGH (ref 70–99)
Glucose-Capillary: 134 mg/dL — ABNORMAL HIGH (ref 70–99)
Glucose-Capillary: 129 mg/dL — ABNORMAL HIGH (ref 70–99)

## 2022-03-01 LAB — PHOSPHORUS: Phosphorus: 2.5 mg/dL (ref 2.5–4.6)

## 2022-03-01 LAB — MAGNESIUM
Magnesium: 2.5 mg/dL — ABNORMAL HIGH (ref 1.7–2.4)
Magnesium: 2.6 mg/dL — ABNORMAL HIGH (ref 1.7–2.4)

## 2022-03-01 MED ORDER — POTASSIUM CHLORIDE 10 MEQ/100ML IV SOLN
10.0000 meq | INTRAVENOUS | Status: AC
  Administered 2022-03-01 (×4): 10 meq via INTRAVENOUS
  Filled 2022-03-01 (×4): qty 100

## 2022-03-01 MED ORDER — RINGERS IV SOLN
INTRAVENOUS | Status: DC
Start: 2022-03-01 — End: 2022-03-01

## 2022-03-01 MED ORDER — LACTATED RINGERS IV SOLN: INTRAVENOUS | Status: DC

## 2022-03-01 MED ORDER — POTASSIUM CHLORIDE CRYS ER 20 MEQ PO TBCR
40.0000 meq | EXTENDED_RELEASE_TABLET | Freq: Once | ORAL | Status: AC
  Administered 2022-03-01: 40 meq via ORAL

## 2022-03-01 MED ORDER — POTASSIUM CHLORIDE CRYS ER 20 MEQ PO TBCR
40.0000 meq | EXTENDED_RELEASE_TABLET | Freq: Once | ORAL | Status: AC
Start: 2022-03-01 — End: 2022-03-01
  Administered 2022-03-01: 40 meq via ORAL
  Filled 2022-03-01: qty 2

## 2022-03-01 MED ORDER — POLYETHYLENE GLYCOL 3350 17 G PO PACK
17.0000 g | PACK | Freq: Every day | ORAL | Status: DC
  Administered 2022-03-01: 17 g via ORAL
  Filled 2022-03-01: qty 1

## 2022-03-01 MED ORDER — ACETAMINOPHEN 500 MG PO TABS
500.0000 mg | ORAL_TABLET | Freq: Four times a day (QID) | ORAL | Status: DC | PRN
  Administered 2022-03-01 – 2022-03-13 (×12): 500 mg via ORAL
  Filled 2022-03-01 (×12): qty 1

## 2022-03-01 NOTE — Progress Notes (Signed)
[facility] Progress Note Patient Name: LOUNELL SCHUMACHER DOB: [birth year] MRN: [id number]   Date of Service  03/01/2022  HPI/Events of Note  K+ 3.7 this AM.  eICU Interventions  Order for KCL 40 meq PO tid discontinued.        Kerry Kass Keanon Bevins 03/01/2022, 6:56 AM

## 2022-03-01 NOTE — Progress Notes (Signed)
[place] Progress Note Patient Name: TANISHKA DROLET DOB: May 10, 1946 MRN: [id number]   Date of Service  03/01/2022  HPI/Events of Note  K+ 2.8 after recent round of K+ replacement, ionized calcium result pending.  eICU Interventions  E-Link electrolyte replacement (modified ) protocol ordered.        Kerry Kass Carlon Davidson 03/01/2022, 1:39 AM

## 2022-03-01 NOTE — Progress Notes (Signed)
NAME:  Tara [name], MRN:  [id number], DOB:  November 12, 1945, LOS: 2 ADMISSION DATE:  02/27/2022 CONSULTATION DATE:  02/27/2022 REFERRING MD:  [place] - EDP CHIEF COMPLAINT:  Acute renal failure   History of Present Illness:  76 year old woman who presented to [facility] ED 6/8 with weakness, confusion and progressive worsening of slurred speech. Reportedly fell out of bed overnight and was unable to get back into bed unassisted. Found on the floor 6/8AM, alert. Family reports fatigue and progressive confusion/slurred speech for the last 1-2 weeks. 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   On ED arrival, patient was noted to have slurred speech without HA, unilateral strength deficit or facial droop. Reported vomiting over the last 1-2 days, more episodes today. Denies fever/chills, CP/SOB, cough/rhinorrhea, diarrhea, dysuria, HA. Endorses generalized weakness and constipation as well as urinary urgency. Afebrile on ED arrival with HR 65, BP soft 91/51 (MAP 64). Labs notable for WBC 20.6 (85% N), Na 143, K 3.7, bicarb < 7, Cr 6.34 (baseline 1.5-1.7), BUN 97; LFTs WNL. Trop mildly elevated at 26, BNP 157. LA 1.2. VBG with pH 7.06, bicarb 9.2. CXR demonstrating old granulomatous disease (remote TB), Renal US without hydronephrosis. CT C-spine negative. CT Head showed interval increase in known planum sphenoidale meningioma with sequelae of ?edema r/t mass. NSGY was consulted and dexamethasone initiated. Unable to obtain contrasted MRI at present due to ARF. Nephrology consulted for ARF with aim to manage without RRT.  PCCM consulted for ICU admission in the setting of acidosis and ARF.  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   Anxiety     Breast cancer ([place]) 2008   right - s/p lumpectomy->chemo, radiation   Depression    Dysrhythmia    hx SVT   Family history of colon cancer    Family history of prostate cancer    GERD (gastroesophageal reflux disease)    H/O: hysterectomy    Hiatal hernia    History of cancer chemotherapy    History of radiation therapy    Hyperlipidemia    Hypertension 01/25/2018   Nonischemic cardiomyopathy ([place])    Personal history of radiation therapy 01/05/209   SVT (supraventricular tachycardia) (HCC)    short RP SVT documented 8/09   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[place])    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 Progressive weakness, fatigue, slurred speech over 1-2 weeks. Vomiting x 1-2 days. CT Head with increased size of known meningioma with ?sequelae of edema r/t mass. NSGY consulted. ARF suspected to be prerenal, Nephro consulted. PCCM consulted for admission.  Interim History / Subjective:  Patient is feeling better Remained afebrile Started making urine, serum creatinine is trending down  Objective:  Blood pressure (!) 142/64, pulse 62, temperature 97.7 F (36.5 C), temperature source Axillary, resp. rate 14, height '4\' 9"'$  (1.448 m), weight 79.6 kg, SpO2 96 %.        Intake/Output Summary (Last 24  hours) at 03/01/2022 1287 Last data filed at 03/01/2022 0600 Gross per 24 hour  Intake 3753.66 ml  Output 1300 ml  Net 2453.66 ml   Filed Weights   02/27/22 2123 02/28/22 0500 03/01/22 0309  Weight: 74.8 kg 74.8 kg 79.6 kg    Physical Examination: Physical exam: General: Acute chronically ill-appearing female, lying on the bed HEENT: [place]/AT, eyes anicteric.  moist mucus membranes Neuro: Alert,  awake following commands Chest: Coarse breath sounds, no wheezes or rhonchi Heart: Regular rate and rhythm, no murmurs or gallops Abdomen: Soft, nontender, nondistended, bowel sounds present Skin: No rash   Assessment & Plan:   AKI on CKD stage IIIb in the setting of medication including Entresto/Aldactone/Jardiance and ibuprofen  Also severe dehydration, improved  High anion gap metabolic acidosis, improved Renal US neg for hydro Serum creatinine continue to improve Patient started making urine, yesterday she made 790 mL of urine Since midnight she has made 750 cc of urine Avoid NSAIDs and other nephrotoxic agents Monitor intake and output She looks volume hydrated Her serum bicarb increased to 26 We will stop bicarbonate infusion Started on Ringer lactate at 100 cc an hour  Hypokalemia/hyperphosphatemia Rhabdomyolysis Closely monitor electrolytes and supplement Serum potassium reached a 3.7 We will give her 40 mill equivalent x1 again Serum phosphorus is within normal limit  Meningioma of planum sphenoidale CT Head 6/8 showing interval increase of previously noted planum sphenoidale meningioma, interval development of hypodensity in the inferior left frontal lobe, favored to represent sequela of remote infarct but possibly also representing edema related to the mass. Appreciate neurosurgery recommendations Recommend MRI with and without contrast once kidney function improves Continue Decadron '4mg'$  Q6H. She will follow neurosurgery as an outpatient  Chronic HFpEF Hypertension Hyperlipidemia Monitor volume status, I&Os Continue home ASA Hold Entresto, Aldactone and Jardiance, Toprol-XL, statin in the setting of ARF  GERD Continue PPI  Prediabetes (A1c 6.3%.) Holding Jardiance in the setting of ARF Continue sliding scale insulin weekly with CBG goal 140-180  Depression/anxiety Hold home Zoloft, Klonipin for now  [employer]: (right click and "Reselect all  SmartList Selections" daily)   Diet/type: Regular consistency DVT prophylaxis: prophylactic heparin  GI prophylaxis: PPI Lines: N/A [place]:  N/A Code Status:  full code Last date of multidisciplinary goals of care discussion [6/10: With patient and her husband at bedside, decision was to continue full scope of care]     Jacky Kindle, MD [place] [facility] See Amion for pager If no response to pager, please call [phone] until 7pm After 7pm, Please call E-link [phone]

## 2022-03-01 NOTE — Evaluation (Signed)
Physical Therapy Evaluation Patient Details Name: Tara [name] MRN: [id number] DOB: 03/06/1946 Today's Date: 03/01/2022  History of Present Illness  pt is a 76 y/o female admitted through ED 6/8 with weakness, confusion and progressive worsening of slurred speech.  CT head showing increased size of known meningioma,   ARF found on work up.   PMHx: HTN, CHF, NiCM, Breast CA with chemo/fdx  Clinical Impression  Pt admitted with/for problems stated above and showing deficits listed below.  Pt needing min to mod assist for basic mobility and gait.Marland Kitchen  Pt currently limited functionally due to the problems listed. ( See problems list.)   Pt will benefit from PT to maximize function and safety in order to get ready for next venue listed below.        Recommendations for follow up therapy are one component of a multi-disciplinary discharge planning process, led by the attending physician.  Recommendations may be updated based on patient status, additional functional criteria and insurance authorization.  Follow Up Recommendations Skilled nursing-short term rehab (<3 hours/day) (Even if SNF beneficial, pt will refuse per dtr.)    Assistance Recommended at Discharge Intermittent Supervision/Assistance (husband/family will need to add assist if pt goes directly home.)  Patient can return home with the following  A little help with walking and/or transfers;A little help with bathing/dressing/bathroom;Assistance with cooking/housework;Assist for transportation;Help with stairs or ramp for entrance;Direct supervision/assist for medications management;Direct supervision/assist for financial management    Equipment Recommendations Rolling walker (2 wheels)  Recommendations for Other Services       Functional Status Assessment Patient has had a recent decline in their functional status and demonstrates the ability to make significant improvements in function in a reasonable and predictable amount of time.      Precautions / Restrictions Precautions Precautions: Fall      Mobility  Bed Mobility Overal bed mobility: Needs Assistance Bed Mobility: Supine to Sit     Supine to sit: Mod assist     General bed mobility comments: light mod truncal assist up and moderate assist to scoot to EOB    Transfers Overall transfer level: Needs assistance   Transfers: Sit to/from Stand Sit to Stand: Min assist           General transfer comment: cues for hand placement.  face to face assist to come forward with minor boost.    Ambulation/Gait Ambulation/Gait assistance: Min assist, Mod assist Gait Distance (Feet): 20 Feet Assistive device: 1 person hand held assist, 2 person hand held assist Gait Pattern/deviations: Step-to pattern, Step-through pattern, Decreased step length - right, Decreased step length - left, Decreased stride length   Gait velocity interpretation: <1.31 ft/sec, indicative of household ambulator   General Gait Details: pt generally unsteady with HHA of 1 to 2 persons ambulating ~20 feet in the room.  Stairs            Wheelchair Mobility    Modified Rankin (Stroke Patients Only)       Balance Overall balance assessment: Needs assistance Sitting-balance support: No upper extremity supported, Feet supported Sitting balance-Leahy Scale: Fair     Standing balance support: Single extremity supported, Bilateral upper extremity supported, During functional activity Standing balance-Leahy Scale: Poor Standing balance comment: generally unsteady needing external support.  Will try RW next session.                             Pertinent Vitals/Pain Pain Assessment Pain Assessment:  0-10 Pain Score: 0-No pain Pain Intervention(s): Monitored during session    [place] expects to be discharged to:: Private residence Living Arrangements: Spouse/significant other Available Help at Discharge: Family;Available  PRN/intermittently Type of Home: House Home Access: Stairs to enter Entrance Stairs-Rails: Right;Left   Alternate Level Stairs-Number of Steps: flight Home Layout: Two level;Laundry or work area in [employer]: Rollator (4 wheels);Grab bars - tub/shower      Prior Function Prior Level of Function : Independent/Modified Independent                     Journalist, newspaper        Extremity/Trunk Assessment   Upper Extremity Assessment Upper Extremity Assessment: Defer to OT evaluation    Lower Extremity Assessment Lower Extremity Assessment: Generalized weakness    Cervical / Trunk Assessment Cervical / Trunk Assessment: Normal  Communication   Communication: No difficulties  Cognition Arousal/Alertness: Lethargic, Awake/alert Behavior During Therapy: Flat affect Overall Cognitive Status:  (NT formally, followed direction well, answered environmental question and clarified well.)                                          General Comments General comments (skin integrity, edema, etc.): HR with activity 117 bpm max and  SpO2 ? down in the 80's on RA, but quickly to 93% on RA, so question drop in sats due to noise of pleth.    Exercises     Assessment/Plan    PT Assessment Patient needs continued PT services  PT Problem List Decreased strength;Decreased activity tolerance;Decreased balance;Decreased mobility;Decreased coordination       PT Treatment Interventions DME instruction;Gait training;Stair training;Functional mobility training;Therapeutic activities;Balance training;Patient/family education    PT Goals (Current goals can be found in the Care Plan section)  Acute Rehab PT Goals Patient Stated Goal: get home. PT Goal Formulation: With patient Time For Goal Achievement: 03/15/22 Potential to Achieve Goals: Good    Frequency Min 3X/week     Co-evaluation PT/OT/SLP Co-Evaluation/Treatment: Yes Reason for Co-Treatment: For  patient/therapist safety PT goals addressed during session: Mobility/safety with mobility         AM-PAC PT "6 Clicks" Mobility  Outcome Measure Help needed turning from your back to your side while in a flat bed without using bedrails?: A Lot Help needed moving from lying on your back to sitting on the side of a flat bed without using bedrails?: A Lot Help needed moving to and from a bed to a chair (including a wheelchair)?: A Lot Help needed standing up from a chair using your arms (e.g., wheelchair or bedside chair)?: A Little Help needed to walk in hospital room?: A Lot Help needed climbing 3-5 steps with a railing? : A Lot 6 Click Score: 13    End of Session   Activity Tolerance: Patient limited by fatigue;Patient tolerated treatment well Patient left: in chair;with call bell/phone within reach;with chair alarm set Nurse Communication: Mobility status PT Visit Diagnosis: Unsteadiness on feet (R26.81);Muscle weakness (generalized) (M62.81)    Time: 0354-6568 PT Time Calculation (min) (ACUTE ONLY): 33 min   Charges:   PT Evaluation $PT Eval Moderate Complexity: 1 Mod          03/01/2022  Ginger Carne., PT Acute Rehabilitation Services [phone]  (pager) [phone]  (office)  Tara [name] 03/01/2022, 2:49 PM

## 2022-03-01 NOTE — Evaluation (Signed)
Occupational Therapy Evaluation Patient Details Name: Tara [name] MRN: [id number] DOB: Oct 24, 1945 Today's Date: 03/01/2022   History of Present Illness pt is a 76 y/o female admitted through ED 6/8 with weakness, confusion and progressive worsening of slurred speech.  CT head showing increased size of known meningioma,   ARF found on work up.   PMHx: HTN, CHF, NiCM, Breast CA with chemo/fdx   Clinical Impression   Pt admitted for concerns listed above. PTA pt reported that she was independent with all ADL's and functional mobility. At this time, pt presents with increased weakness and decreased activity tolerance. She is requiring min-mod A for all ADL's. Recommending SNF at this time, to maximize pt independence and safety. OT will follow acutely.      Recommendations for follow up therapy are one component of a multi-disciplinary discharge planning process, led by the attending physician.  Recommendations may be updated based on patient status, additional functional criteria and insurance authorization.   Follow Up Recommendations  Skilled nursing-short term rehab (<3 hours/day)    Assistance Recommended at Discharge Frequent or constant Supervision/Assistance  Patient can return home with the following A lot of help with walking and/or transfers;A lot of help with bathing/dressing/bathroom;Assistance with cooking/housework;Direct supervision/assist for medications management;Direct supervision/assist for financial management;Assist for transportation;Help with stairs or ramp for entrance    Functional Status Assessment  Patient has had a recent decline in their functional status and demonstrates the ability to make significant improvements in function in a reasonable and predictable amount of time.  Equipment Recommendations  None recommended by OT    Recommendations for Other Services       Precautions / Restrictions Precautions Precautions: Fall Restrictions Weight Bearing  Restrictions: No      Mobility Bed Mobility Overal bed mobility: Needs Assistance Bed Mobility: Supine to Sit     Supine to sit: Mod assist     General bed mobility comments: light mod truncal assist up and moderate assist to scoot to EOB    Transfers Overall transfer level: Needs assistance   Transfers: Sit to/from Stand Sit to Stand: Min assist           General transfer comment: cues for hand placement.  face to face assist to come forward with minor boost.      Balance Overall balance assessment: Needs assistance Sitting-balance support: No upper extremity supported, Feet supported Sitting balance-Leahy Scale: Fair     Standing balance support: Single extremity supported, Bilateral upper extremity supported, During functional activity Standing balance-Leahy Scale: Poor Standing balance comment: generally unsteady needing external support.  Will try RW next session.                           ADL either performed or assessed with clinical judgement   ADL Overall ADL's : Needs assistance/impaired Eating/Feeding: Set up;Sitting   Grooming: Set up;Sitting   Upper Body Bathing: Set up;Sitting   Lower Body Bathing: Moderate assistance;Sitting/lateral leans;Sit to/from stand   Upper Body Dressing : Set up;Sitting   Lower Body Dressing: Moderate assistance;Sitting/lateral leans;Sit to/from stand   Toilet Transfer: Minimal assistance;Ambulation   Toileting- Clothing Manipulation and Hygiene: Moderate assistance;Sitting/lateral lean;Sit to/from stand       Functional mobility during ADLs: Minimal assistance General ADL Comments: Pt limited by weakness and decreased acitivy tolerance, requiring stability and assist     Vision Baseline Vision/History: 1 Wears glasses Ability to See in Adequate Light: 0 Adequate Patient Visual Report:  No change from baseline Vision Assessment?: No apparent visual deficits     Perception     Praxis       Pertinent Vitals/Pain Pain Assessment Pain Assessment: No/denies pain Pain Intervention(s): Monitored during session     Hand Dominance Right   Extremity/Trunk Assessment Upper Extremity Assessment Upper Extremity Assessment: Generalized weakness   Lower Extremity Assessment Lower Extremity Assessment: Defer to PT evaluation   Cervical / Trunk Assessment Cervical / Trunk Assessment: Normal   Communication Communication Communication: No difficulties   Cognition Arousal/Alertness: Lethargic, Awake/alert Behavior During Therapy: Flat affect Overall Cognitive Status: Within Functional Limits for tasks assessed                                 General Comments: NT formally, followed direction well, answered environmental question and clarified well.     General Comments  Vitals remaining relatively stable throuhgout session    Exercises     Shoulder Instructions      Home Living Family/patient expects to be discharged to:: Private residence Living Arrangements: Spouse/significant other Available Help at Discharge: Family;Available PRN/intermittently Type of Home: House Home Access: Stairs to enter   Entrance Stairs-Rails: Right;Left Home Layout: Two level;Laundry or work area in basement Alternate Therapist, sports of Steps: flight Alternate Level Stairs-Rails: Right;Left Bathroom Shower/Tub:  (walk in tub)   Biochemist, clinical: Standard Bathroom Accessibility: Yes   Home Equipment: Rollator (4 wheels);Grab bars - tub/shower          Prior Functioning/Environment Prior Level of Function : Independent/Modified Independent                        OT Problem List: Decreased strength;Decreased activity tolerance;Impaired balance (sitting and/or standing);Cardiopulmonary status limiting activity;Obesity      OT Treatment/Interventions: Self-care/ADL training;Therapeutic exercise;Energy conservation;DME and/or AE instruction;Therapeutic  activities;Patient/family education;Balance training    OT Goals(Current goals can be found in the care plan section) Acute Rehab OT Goals Patient Stated Goal: To go home OT Goal Formulation: With patient Time For Goal Achievement: 03/15/22 Potential to Achieve Goals: Good ADL Goals Pt Will Perform Grooming: with modified independence;standing Pt Will Perform Lower Body Bathing: with modified independence;sitting/lateral leans;sit to/from stand Pt Will Perform Lower Body Dressing: with modified independence;sitting/lateral leans;sit to/from stand Pt Will Transfer to Toilet: with modified independence;ambulating Pt Will Perform Toileting - Clothing Manipulation and hygiene: with modified independence;sitting/lateral leans;sit to/from stand  OT Frequency: Min 2X/week    Co-evaluation PT/OT/SLP Co-Evaluation/Treatment: Yes Reason for Co-Treatment: For patient/therapist safety   OT goals addressed during session: ADL's and self-care      AM-PAC OT "6 Clicks" Daily Activity     Outcome Measure Help from another person eating meals?: A Little Help from another person taking care of personal grooming?: A Little Help from another person toileting, which includes using toliet, bedpan, or urinal?: A Lot Help from another person bathing (including washing, rinsing, drying)?: A Lot Help from another person to put on and taking off regular upper body clothing?: A Little Help from another person to put on and taking off regular lower body clothing?: A Lot 6 Click Score: 15   End of Session Equipment Utilized During Treatment: Gait belt Nurse Communication: Mobility status  Activity Tolerance: Patient tolerated treatment well Patient left: in chair;with call bell/phone within reach;with family/visitor present  OT Visit Diagnosis: Unsteadiness on feet (R26.81);Other abnormalities of gait and mobility (R26.89);Muscle weakness (generalized) (M62.81)  Time: 9144-4584 OT Time  Calculation (min): 33 min Charges:  OT General Charges $OT Visit: 1 Visit OT Evaluation $OT Eval Moderate Complexity: 1 Mod  Jaking Thayer H., OTR/L Acute Rehabilitation  Cahlil Sattar Elane Yolanda Bonine 03/01/2022, 7:28 PM

## 2022-03-02 DIAGNOSIS — F419 Anxiety disorder, unspecified: Secondary | ICD-10-CM | POA: Diagnosis not present

## 2022-03-02 DIAGNOSIS — I10 Essential (primary) hypertension: Secondary | ICD-10-CM

## 2022-03-02 DIAGNOSIS — E7800 Pure hypercholesterolemia, unspecified: Secondary | ICD-10-CM

## 2022-03-02 DIAGNOSIS — N179 Acute kidney failure, unspecified: Secondary | ICD-10-CM | POA: Diagnosis not present

## 2022-03-02 LAB — BASIC METABOLIC PANEL
Anion gap: 11 (ref 5–15)
BUN: 49 mg/dL — ABNORMAL HIGH (ref 8–23)
CO2: 26 mmol/L (ref 22–32)
Calcium: 8 mg/dL — ABNORMAL LOW (ref 8.9–10.3)
Chloride: 102 mmol/L (ref 98–111)
Creatinine, Ser: 2.4 mg/dL — ABNORMAL HIGH (ref 0.44–1.00)
GFR, Estimated: 20 mL/min — ABNORMAL LOW (ref >60)
Glucose, Bld: 136 mg/dL — ABNORMAL HIGH (ref 70–99)
Potassium: 4.2 mmol/L (ref 3.5–5.1)
Sodium: 139 mmol/L (ref 135–145)

## 2022-03-02 LAB — GLUCOSE, CAPILLARY
Glucose-Capillary: 136 mg/dL — ABNORMAL HIGH (ref 70–99)
Glucose-Capillary: 121 mg/dL — ABNORMAL HIGH (ref 70–99)
Glucose-Capillary: 121 mg/dL — ABNORMAL HIGH (ref 70–99)
Glucose-Capillary: 122 mg/dL — ABNORMAL HIGH (ref 70–99)
Glucose-Capillary: 111 mg/dL — ABNORMAL HIGH (ref 70–99)

## 2022-03-02 LAB — CBC
HCT: 22.9 % — ABNORMAL LOW (ref 36.0–46.0)
Hemoglobin: 7.7 g/dL — ABNORMAL LOW (ref 12.0–15.0)
MCH: 27.3 pg (ref 26.0–34.0)
MCHC: 33.6 g/dL (ref 30.0–36.0)
MCV: 81.2 fL (ref 80.0–100.0)
Platelets: 201 10*3/uL (ref 150–400)
RBC: 2.82 MIL/uL — ABNORMAL LOW (ref 3.87–5.11)
RDW: 16.8 % — ABNORMAL HIGH (ref 11.5–15.5)
WBC: 22.5 10*3/uL — ABNORMAL HIGH (ref 4.0–10.5)
nRBC: 0.1 % (ref 0.0–0.2)

## 2022-03-02 LAB — CALCIUM, IONIZED: Calcium, Ionized, Serum: 4.4 mg/dL — ABNORMAL LOW (ref 4.5–5.6)

## 2022-03-02 MED ORDER — PANTOPRAZOLE SODIUM 40 MG PO TBEC
40.0000 mg | DELAYED_RELEASE_TABLET | Freq: Every day | ORAL | Status: DC
  Administered 2022-03-02 – 2022-03-13 (×12): 40 mg via ORAL
  Filled 2022-03-02 (×12): qty 1

## 2022-03-02 MED ORDER — SENNA 8.6 MG PO TABS
2.0000 | ORAL_TABLET | Freq: Every day | ORAL | Status: DC
  Administered 2022-03-02 – 2022-03-13 (×8): 17.2 mg via ORAL
  Filled 2022-03-02 (×9): qty 2

## 2022-03-02 MED ORDER — ROSUVASTATIN CALCIUM 5 MG PO TABS
10.0000 mg | ORAL_TABLET | Freq: Every day | ORAL | Status: DC
  Administered 2022-03-02 – 2022-03-13 (×12): 10 mg via ORAL
  Filled 2022-03-02 (×12): qty 2

## 2022-03-02 MED ORDER — POLYETHYLENE GLYCOL 3350 17 G PO PACK
17.0000 g | PACK | Freq: Two times a day (BID) | ORAL | Status: DC
  Administered 2022-03-02 – 2022-03-05 (×8): 17 g via ORAL
  Filled 2022-03-02 (×12): qty 1

## 2022-03-02 MED ORDER — SERTRALINE HCL 50 MG PO TABS
25.0000 mg | ORAL_TABLET | Freq: Every day | ORAL | Status: DC
  Administered 2022-03-02 – 2022-03-07 (×6): 25 mg via ORAL
  Filled 2022-03-02 (×6): qty 1

## 2022-03-02 MED ORDER — CLONAZEPAM 0.5 MG PO TABS
0.5000 mg | ORAL_TABLET | Freq: Every day | ORAL | Status: DC
  Administered 2022-03-02 – 2022-03-12 (×11): 0.5 mg via ORAL
  Filled 2022-03-02 (×11): qty 1

## 2022-03-02 NOTE — Progress Notes (Signed)
PROGRESS NOTE        PATIENT DETAILS Name: Tara [name] Age: 76 y.o. Sex: female Date of Birth: 08-May-1946 Admit Date: 02/27/2022 Admitting Physician Julian Hy, DO WSF:[id number], Norwood Levo, MD  Brief Summary: Patient is a 76 y.o.  female with a history of HTN, HLD, CKD stage IIIb, chronic HFpEF, meningioma, breast CA-s/p right lumpectomy and chemoradiation in 2008, depression/anxiety who presented with weakness/fatigue/confusion/slurred speech-she was found to have AKI-and initially admitted to the ICU for possible RRT.  However with supportive care-renal function improved-she was subsequently [facility] on [facility]/11.  Significant events: 6/08>> presented to ED with weakness/fatigue/confusion/slurred sleep-AKI-meningioma 6/11>> transfer to [facility]  Significant studies: 6/08>> CT head: Interval increase in size of previously noted planum sphenoidal meningioma. 6/08>> CT C-spine: No fracture or dislocation 6/08>> CXR: No cardiopulmonary disease. 6/08>> renal ultrasound: No hydronephrosis. 6/09>> Echo: EF 55%  Significant microbiology data: 6/08>> influenza/COVID PCR: Negative  Procedures: None  Consults: Nephrology, PCCM  Subjective: Lying comfortably in bed-denies any chest pain or shortness of breath.  Complains of constipation.  Objective: Vitals: Blood pressure 128/60, pulse 67, temperature 98.1 F (36.7 C), temperature source Oral, resp. rate 16, height '4\' 9"'$  (1.448 m), weight 83.4 kg, SpO2 94 %.   Exam: Gen Exam:Alert awake-not in any distress HEENT:atraumatic, normocephalic Chest: B/L clear to auscultation anteriorly CVS:S1S2 regular Abdomen:soft non tender, non distended Extremities:no edema Neurology: Non focal Skin: no rash  Pertinent Labs/Radiology:    Latest Ref Rng & Units 03/02/2022    2:46 AM 03/01/2022    5:50 AM 02/28/2022    1:38 AM  CBC  WBC 4.0 - 10.5 K/uL 22.5  17.1  15.8   Hemoglobin 12.0  - 15.0 g/dL 7.7  7.8  8.6   Hematocrit 36.0 - 46.0 % 22.9  22.4  25.1   Platelets 150 - 400 K/uL 201  204  222     Lab Results  Component Value Date   NA 139 03/02/2022   K 4.2 03/02/2022   CL 102 03/02/2022   CO2 26 03/02/2022      Assessment/Plan: AKI on CKD stage IIIb: AKI hemodynamically mediated in the setting of hypotension/Entresto/Aldactone/Jardiance/NSAID use.  Renal function improving with supportive care-continue to avoid nephrotoxic agents.  Follow closely.  Meningioma: Slurred speech has resolved-continue Decadron-MRI brain with/without contrast planned once renal function improves further.  Neurosurgery following.  Normocytic anemia: Due to CKD-worsened due to AKI and IVF administration.  No indications for transfusion-watch closely.  Leukocytosis: Likely due to steroid use-no indication for infection.  Chronic HFpEF: Volume status stable-continue to hold Entresto, Aldactone, Jardiance.  HTN: BP stable without use of any antihypertensives-we will reassess daily and resume accordingly.  HLD: Resume Crestor  Prediabetes (A1c 6.3 on 6/8): CBG stable on SSI  Recent Labs    03/01/22 1937 03/01/22 2318 03/02/22 0411  GLUCAP 134* 129* 136*    Depression/anxiety: Resume Zoloft/Klonopin.  GERD: Continue PPI  Constipation: Starting MiraLAX/senna-reassess over the next few days.  Morbid Obesity: Estimated body mass index is 39.79 kg/m as calculated from the following:   Height as of this encounter: '4\' 9"'$  (1.448 m).   Weight as of this encounter: 83.4 kg.   Code status:   Code Status: Full Code   DVT Prophylaxis: heparin injection 5,000 Units Start: 02/28/22 0600 SCDs Start: 02/27/22 2010   Family  Communication: Spouse at bedside   Disposition Plan: Status is: Inpatient Remains inpatient appropriate because: Resolving AKI-not yet stable for discharge.  PT recommending SNF.   Planned Discharge Destination:Skilled nursing facility   Diet: Diet Order              Diet heart healthy/carb modified Room service appropriate? Yes; Fluid consistency: Thin  Diet effective now                     Antimicrobial agents: Anti-infectives (From admission, onward)    None        MEDICATIONS: Scheduled Meds:  aspirin  81 mg Oral Daily   Chlorhexidine Gluconate Cloth  6 each Topical Q0600   clonazePAM  0.5 mg Oral QHS   dexamethasone (DECADRON) injection  4 mg Intravenous Q6H   heparin  5,000 Units Subcutaneous Q8H   insulin aspart  0-9 Units Subcutaneous Q4H   insulin glargine-yfgn  10 Units Subcutaneous QHS   lidocaine  1 patch Transdermal Q24H   montelukast  10 mg Oral QHS   pantoprazole  40 mg Oral Q1200   polyethylene glycol  17 g Oral BID   rosuvastatin  20 mg Oral Daily   senna  2 tablet Oral Daily   sertraline  25 mg Oral Daily   Continuous Infusions:  sodium chloride Stopped (02/28/22 1547)   lactated ringers 100 mL/hr at 03/02/22 0713   PRN Meds:.sodium chloride, acetaminophen, docusate sodium, ondansetron (ZOFRAN) IV   I have personally reviewed following labs and imaging studies  LABORATORY DATA: CBC: Recent Labs  Lab 02/27/22 1438 02/27/22 1512 02/27/22 1914 02/27/22 2156 02/28/22 0138 03/01/22 0550 03/02/22 0246  WBC 20.6*  --   --  17.7* 15.8* 17.1* 22.5*  NEUTROABS 17.6*  --   --   --   --   --   --   HGB 10.5*   < > 9.5* 9.5* 8.6* 7.8* 7.7*  HCT 33.1*   < > 28.0* 28.2* 25.1* 22.4* 22.9*  MCV 87.6  --   --  84.7 81.0 78.3* 81.2  PLT 259  --   --  240 222 204 201   < > = values in this interval not displayed.    Basic Metabolic Panel: Recent Labs  Lab 02/27/22 2156 02/28/22 0113 02/28/22 1203 02/28/22 1907 02/28/22 2331 03/01/22 0550 03/02/22 0246  NA 145 144 141 137 137 136 139  K 3.5 3.1* 2.9* 2.5* 2.8* 3.7 4.2  CL 117* 113* 107 102 102 98 102  CO2 8* 10* 11* 20* '23 26 26  '$ GLUCOSE 100* 99 224* 171* 132* 122* 136*  BUN 95* 88* 80* 69* 65* 60* 49*  CREATININE 5.71* 5.11* 4.36* 3.47*  3.26* 2.92* 2.40*  CALCIUM 8.9 8.8* 8.0* 7.9* 7.7* 7.5* 8.0*  MG 2.4 2.2  --  1.7 2.6* 2.5*  --   PHOS 7.4* 6.2*  --  3.0  --  2.5  --     GFR: Estimated Creatinine Clearance: 17.8 mL/min (A) (by C-G formula based on SCr of 2.4 mg/dL (H)).  Liver Function Tests: Recent Labs  Lab 02/27/22 1438 02/28/22 0113  AST 34  --   ALT 22  --   ALKPHOS 78  --   BILITOT 0.6  --   PROT 7.2  --   ALBUMIN 3.5 2.9*   Recent Labs  Lab 02/27/22 2156  LIPASE 67*   No results for input(s): "AMMONIA" in the last 168 hours.  Coagulation Profile: Recent Labs  Lab 02/27/22 1438  INR 1.1    Cardiac Enzymes: Recent Labs  Lab 02/27/22 2156  CKTOTAL 1,062*    BNP (last 3 results) No results for input(s): "PROBNP" in the last 8760 hours.  Lipid Profile: No results for input(s): "CHOL", "HDL", "LDLCALC", "TRIG", "CHOLHDL", "LDLDIRECT" in the last 72 hours.  Thyroid Function Tests: No results for input(s): "TSH", "T4TOTAL", "FREET4", "T3FREE", "THYROIDAB" in the last 72 hours.  Anemia Panel: No results for input(s): "VITAMINB12", "FOLATE", "FERRITIN", "TIBC", "IRON", "RETICCTPCT" in the last 72 hours.  Urine analysis:    Component Value Date/Time   COLORURINE COLORLESS (A) 10/24/2016 1858   APPEARANCEUR CLEAR 10/24/2016 1858   LABSPEC 1.006 10/24/2016 1858   PHURINE 5.0 10/24/2016 1858   GLUCOSEU NEGATIVE 10/24/2016 1858   HGBUR NEGATIVE 10/24/2016 1858   BILIRUBINUR NEGATIVE 10/24/2016 1858   KETONESUR NEGATIVE 10/24/2016 1858   PROTEINUR NEGATIVE 10/24/2016 1858   UROBILINOGEN 0.2 02/07/2013 1859   NITRITE NEGATIVE 10/24/2016 1858   LEUKOCYTESUR NEGATIVE 10/24/2016 1858    Sepsis Labs: Lactic Acid, Venous    Component Value Date/Time   LATICACIDVEN 1.7 02/27/2022 2156    MICROBIOLOGY: Recent Results (from the past 240 hour(s))  Resp Panel by RT-PCR (Flu A&B, Covid) Anterior Nasal Swab     Status: None   Collection Time: 02/27/22  4:59 PM   Specimen: Anterior Nasal  Swab  Result Value Ref Range Status   SARS Coronavirus 2 by RT PCR NEGATIVE NEGATIVE Final    Comment: (NOTE) SARS-CoV-2 target nucleic acids are NOT DETECTED.  The SARS-CoV-2 RNA is generally detectable in upper respiratory specimens during the acute phase of infection. The lowest concentration of SARS-CoV-2 viral copies this assay can detect is 138 copies/mL. A negative result does not preclude SARS-Cov-2 infection and should not be used as the sole basis for treatment or other patient management decisions. A negative result may occur with  improper specimen collection/handling, submission of specimen other than nasopharyngeal swab, presence of viral mutation(s) within the areas targeted by this assay, and inadequate number of viral copies(<138 copies/mL). A negative result must be combined with clinical observations, patient history, and epidemiological information. The expected result is Negative.  Fact Sheet for Patients:  EntrepreneurPulse.com.au  Fact Sheet for Healthcare Providers:  IncredibleEmployment.be  This test is no t yet approved or cleared by the [place] FDA and  has been authorized for detection and/or diagnosis of SARS-CoV-2 by FDA under an Emergency Use Authorization (EUA). This EUA will remain  in effect (meaning this test can be used) for the duration of the COVID-19 declaration under Section 564(b)(1) of the Act, 21 U.S.C.section 360bbb-3(b)(1), unless the authorization is terminated  or revoked sooner.       Influenza A by PCR NEGATIVE NEGATIVE Final   Influenza B by PCR NEGATIVE NEGATIVE Final    Comment: (NOTE) The Xpert Xpress SARS-CoV-2/FLU/RSV plus assay is intended as an aid in the diagnosis of influenza from Nasopharyngeal swab specimens and should not be used as a sole basis for treatment. Nasal washings and aspirates are unacceptable for Xpert Xpress SARS-CoV-2/FLU/RSV testing.  Fact Sheet for  Patients: EntrepreneurPulse.com.au  Fact Sheet for Healthcare Providers: IncredibleEmployment.be  This test is not yet approved or cleared by the [place] FDA and has been authorized for detection and/or diagnosis of SARS-CoV-2 by FDA under an Emergency Use Authorization (EUA). This EUA will remain in effect (meaning this test can be used) for the duration of the COVID-19 declaration under Section 564(b)(1) of the  Act, 21 U.S.C. section 360bbb-3(b)(1), unless the authorization is terminated or revoked.  Performed at [facility], [place] [address], [place], [place] [address]   MRSA Next Gen by PCR, Nasal     Status: None   Collection Time: 02/27/22  9:30 PM   Specimen: Nasal Mucosa; Nasal Swab  Result Value Ref Range Status   MRSA by PCR Next Gen NOT DETECTED NOT DETECTED Final    Comment: (NOTE) The GeneXpert MRSA Assay (FDA approved for NASAL specimens only), is one component of a comprehensive MRSA colonization surveillance program. It is not intended to diagnose MRSA infection nor to guide or monitor treatment for MRSA infections. Test performance is not FDA approved in patients less than 26 years old. Performed at [facility], [place] [address], [place], [place] [address]     RADIOLOGY STUDIES/RESULTS: ECHOCARDIOGRAM COMPLETE  Result Date: 02/28/2022    ECHOCARDIOGRAM REPORT   Patient Name:   Tara [name] Siegel Date of Exam: 02/28/2022 Medical Rec #:  [id number]      Height:       57.0 in Accession #:    [id number]     Weight:       164.9 lb Date of Birth:  01/03/1946      BSA:          1.657 m Patient Age:    19 years       BP:           132/70 mmHg Patient Gender: F              HR:           63 bpm. Exam Location:  Inpatient Procedure: 2D Echo, Cardiac Doppler, Color Doppler and Intracardiac            Opacification Agent Indications:    CHF-acute systolic  History:        Patient has prior history of Echocardiogram examinations. Risk                  Factors:Hypertension and Dyslipidemia. DOE. GERD. Hx breast                 cancer.  Sonographer:    Clayton Lefort RDCS (AE) Referring Phys: [id number] [facility] M REESE  Sonographer Comments: Technically difficult study due to poor echo windows. Image acquisition challenging due to respiratory motion. Patient sleeping and unable to hold breath to reduce respiratory motion. IMPRESSIONS  1. Left ventricular ejection fraction, by estimation, is 55%. The left ventricle has normal function. The left ventricle has no regional wall motion abnormalities. Left ventricular diastolic parameters are consistent with Grade I diastolic dysfunction (impaired relaxation).  2. Peak RV-RA gradient 25 mmHg. Right ventricular systolic function is normal. The right ventricular size is normal.  3. The mitral valve is normal in structure. No evidence of mitral valve regurgitation. No evidence of mitral stenosis.  4. The aortic valve is tricuspid. There is mild calcification of the aortic valve. Aortic valve regurgitation is not visualized. No aortic stenosis is present. FINDINGS  Left Ventricle: Left ventricular ejection fraction, by estimation, is 55%. The left ventricle has normal function. The left ventricle has no regional wall motion abnormalities. Definity contrast agent was given IV to delineate the left ventricular endocardial borders. The left ventricular internal cavity size was normal in size. There is no left ventricular hypertrophy. Left ventricular diastolic parameters are consistent with Grade I diastolic dysfunction (impaired relaxation). Right Ventricle: Peak RV-RA gradient 25 mmHg.  The right ventricular size is normal. No increase in right ventricular wall thickness. Right ventricular systolic function is normal. Left Atrium: Left atrial size was normal in size. Right Atrium: Right atrial size was normal in size. Pericardium: There is no evidence of pericardial effusion. Mitral Valve: The mitral valve is normal  in structure. No evidence of mitral valve regurgitation. No evidence of mitral valve stenosis. Tricuspid Valve: The tricuspid valve is normal in structure. Tricuspid valve regurgitation is trivial. Aortic Valve: The aortic valve is tricuspid. There is mild calcification of the aortic valve. Aortic valve regurgitation is not visualized. No aortic stenosis is present. Aortic valve mean gradient measures 3.0 mmHg. Aortic valve peak gradient measures 5.7 mmHg. Aortic valve area, by VTI measures 2.87 cm. Pulmonic Valve: The pulmonic valve was normal in structure. Pulmonic valve regurgitation is not visualized. Aorta: The aortic root is normal in size and structure. Venous: The inferior vena cava was not well visualized. IAS/Shunts: No atrial level shunt detected by color flow Doppler.  LEFT VENTRICLE PLAX 2D LVIDd:         4.60 cm   Diastology LVIDs:         2.90 cm   LV e' medial:    9.32 cm/s LV PW:         0.90 cm   LV E/e' medial:  7.2 LV IVS:        1.00 cm   LV e' lateral:   10.30 cm/s LVOT diam:     2.20 cm   LV E/e' lateral: 6.5 LV SV:         70 LV SV Index:   42 LVOT Area:     3.80 cm  RIGHT VENTRICLE RV Basal diam:  3.40 cm RV S prime:     16.50 cm/s TAPSE (M-mode): 2.4 cm LEFT ATRIUM             Index        RIGHT ATRIUM           Index LA diam:        3.70 cm 2.23 cm/m   RA Area:     15.50 cm LA Vol (A2C):   38.4 ml 23.17 ml/m  RA Volume:   39.70 ml  23.96 ml/m LA Vol (A4C):   47.1 ml 28.42 ml/m LA Biplane Vol: 44.6 ml 26.92 ml/m  AORTIC VALVE AV Area (Vmax):    2.71 cm AV Area (Vmean):   2.66 cm AV Area (VTI):     2.87 cm AV Vmax:           119.00 cm/s AV Vmean:          77.500 cm/s AV VTI:            0.242 m AV Peak Grad:      5.7 mmHg AV Mean Grad:      3.0 mmHg LVOT Vmax:         84.90 cm/s LVOT Vmean:        54.300 cm/s LVOT VTI:          0.183 m LVOT/AV VTI ratio: 0.76  AORTA Ao Root diam: 2.90 cm Ao Asc diam:  2.90 cm MITRAL VALVE               TRICUSPID VALVE MV Area (PHT): 2.35 cm    TR  Peak grad:   25.4 mmHg MV Decel Time: 323 msec    TR Vmax:        252.00  cm/s MV E velocity: 66.90 cm/s MV A velocity: 72.30 cm/s  SHUNTS MV E/A ratio:  0.93        Systemic VTI:  0.18 m                            Systemic Diam: 2.20 cm Dalton McleanMD Electronically signed by Franki Monte Signature Date/Time: 02/28/2022/3:47:32 PM    Final      LOS: 3 days   Oren Binet, MD  [facility]    To contact the attending provider between 7A-7P or the covering provider during after hours 7P-7A, please log into the web site [email or web address] and access using universal [place] password for that web site. If you do not have the password, please call the hospital operator.  03/02/2022, 9:11 AM

## 2022-03-03 DIAGNOSIS — D429 Neoplasm of uncertain behavior of meninges, unspecified: Secondary | ICD-10-CM | POA: Diagnosis not present

## 2022-03-03 DIAGNOSIS — I480 Paroxysmal atrial fibrillation: Secondary | ICD-10-CM | POA: Diagnosis not present

## 2022-03-03 DIAGNOSIS — D72829 Elevated white blood cell count, unspecified: Secondary | ICD-10-CM

## 2022-03-03 DIAGNOSIS — F419 Anxiety disorder, unspecified: Secondary | ICD-10-CM | POA: Diagnosis not present

## 2022-03-03 DIAGNOSIS — I4891 Unspecified atrial fibrillation: Secondary | ICD-10-CM | POA: Diagnosis not present

## 2022-03-03 DIAGNOSIS — I5022 Chronic systolic (congestive) heart failure: Secondary | ICD-10-CM | POA: Diagnosis not present

## 2022-03-03 DIAGNOSIS — N179 Acute kidney failure, unspecified: Secondary | ICD-10-CM | POA: Diagnosis not present

## 2022-03-03 DIAGNOSIS — I10 Essential (primary) hypertension: Secondary | ICD-10-CM | POA: Diagnosis not present

## 2022-03-03 DIAGNOSIS — E7800 Pure hypercholesterolemia, unspecified: Secondary | ICD-10-CM | POA: Diagnosis not present

## 2022-03-03 LAB — COMPREHENSIVE METABOLIC PANEL
ALT: 21 U/L (ref 0–44)
AST: 43 U/L — ABNORMAL HIGH (ref 15–41)
Albumin: 2.2 g/dL — ABNORMAL LOW (ref 3.5–5.0)
Alkaline Phosphatase: 41 U/L (ref 38–126)
Anion gap: 9 (ref 5–15)
BUN: 36 mg/dL — ABNORMAL HIGH (ref 8–23)
CO2: 22 mmol/L (ref 22–32)
Calcium: 7.8 mg/dL — ABNORMAL LOW (ref 8.9–10.3)
Chloride: 107 mmol/L (ref 98–111)
Creatinine, Ser: 1.65 mg/dL — ABNORMAL HIGH (ref 0.44–1.00)
GFR, Estimated: 32 mL/min — ABNORMAL LOW (ref >60)
Glucose, Bld: 106 mg/dL — ABNORMAL HIGH (ref 70–99)
Potassium: 4.2 mmol/L (ref 3.5–5.1)
Sodium: 138 mmol/L (ref 135–145)
Total Bilirubin: 0.6 mg/dL (ref 0.3–1.2)
Total Protein: 4.7 g/dL — ABNORMAL LOW (ref 6.5–8.1)

## 2022-03-03 LAB — MAGNESIUM: Magnesium: 1.7 mg/dL (ref 1.7–2.4)

## 2022-03-03 LAB — GLUCOSE, CAPILLARY
Glucose-Capillary: 103 mg/dL — ABNORMAL HIGH (ref 70–99)
Glucose-Capillary: 107 mg/dL — ABNORMAL HIGH (ref 70–99)
Glucose-Capillary: 121 mg/dL — ABNORMAL HIGH (ref 70–99)
Glucose-Capillary: 122 mg/dL — ABNORMAL HIGH (ref 70–99)
Glucose-Capillary: 140 mg/dL — ABNORMAL HIGH (ref 70–99)
Glucose-Capillary: 92 mg/dL (ref 70–99)

## 2022-03-03 LAB — TROPONIN I (HIGH SENSITIVITY)
Troponin I (High Sensitivity): 26 ng/L — ABNORMAL HIGH (ref <18)
Troponin I (High Sensitivity): 21 ng/L — ABNORMAL HIGH (ref <18)

## 2022-03-03 LAB — CBC
HCT: 23.3 % — ABNORMAL LOW (ref 36.0–46.0)
Hemoglobin: 7.9 g/dL — ABNORMAL LOW (ref 12.0–15.0)
MCH: 28.5 pg (ref 26.0–34.0)
MCHC: 33.9 g/dL (ref 30.0–36.0)
MCV: 84.1 fL (ref 80.0–100.0)
Platelets: 176 10*3/uL (ref 150–400)
RBC: 2.77 MIL/uL — ABNORMAL LOW (ref 3.87–5.11)
RDW: 17.2 % — ABNORMAL HIGH (ref 11.5–15.5)
WBC: 23.6 10*3/uL — ABNORMAL HIGH (ref 4.0–10.5)
nRBC: 0.1 % (ref 0.0–0.2)

## 2022-03-03 LAB — HEPARIN LEVEL (UNFRACTIONATED)
Heparin Unfractionated: >1.1 IU/mL — ABNORMAL HIGH (ref 0.30–0.70)
Heparin Unfractionated: >1.1 IU/mL — ABNORMAL HIGH (ref 0.30–0.70)

## 2022-03-03 LAB — LACTIC ACID, PLASMA
Lactic Acid, Venous: 1.9 mmol/L (ref 0.5–1.9)
Lactic Acid, Venous: 1.7 mmol/L (ref 0.5–1.9)

## 2022-03-03 LAB — TSH: TSH: 0.429 u[IU]/mL (ref 0.350–4.500)

## 2022-03-03 MED ORDER — DILTIAZEM HCL-DEXTROSE 125-5 MG/125ML-% IV SOLN (PREMIX)
5.0000 mg/h | INTRAVENOUS | Status: DC
  Administered 2022-03-03: 5 mg/h via INTRAVENOUS
  Filled 2022-03-03: qty 125

## 2022-03-03 MED ORDER — HEPARIN (PORCINE) 25000 UT/250ML-% IV SOLN
1150.0000 [IU]/h | INTRAVENOUS | Status: DC
Start: 2022-03-03 — End: 2022-03-03
  Administered 2022-03-03: 1150 [IU]/h via INTRAVENOUS
  Filled 2022-03-03: qty 250

## 2022-03-03 MED ORDER — LACTATED RINGERS IV BOLUS: 500.0000 mL | Freq: Once | INTRAVENOUS | Status: DC

## 2022-03-03 MED ORDER — AMIODARONE IV BOLUS ONLY 150 MG/100ML
150.0000 mg | Freq: Once | INTRAVENOUS | Status: AC
  Administered 2022-03-03: 150 mg via INTRAVENOUS
  Filled 2022-03-03: qty 100

## 2022-03-03 MED ORDER — AMIODARONE HCL IN DEXTROSE 360-4.14 MG/200ML-% IV SOLN
60.0000 mg/h | INTRAVENOUS | Status: AC
  Administered 2022-03-03: 60 mg/h via INTRAVENOUS
  Filled 2022-03-03: qty 200

## 2022-03-03 MED ORDER — MAGNESIUM SULFATE 2 GM/50ML IV SOLN
2.0000 g | Freq: Once | INTRAVENOUS | Status: AC
  Administered 2022-03-03: 2 g via INTRAVENOUS
  Filled 2022-03-03: qty 50

## 2022-03-03 MED ORDER — AMIODARONE HCL IN DEXTROSE 360-4.14 MG/200ML-% IV SOLN
30.0000 mg/h | INTRAVENOUS | Status: DC
  Administered 2022-03-03 – 2022-03-04 (×2): 30 mg/h via INTRAVENOUS
  Filled 2022-03-03 (×2): qty 200

## 2022-03-03 MED ORDER — MELATONIN 5 MG PO TABS
5.0000 mg | ORAL_TABLET | Freq: Every evening | ORAL | Status: DC | PRN
  Administered 2022-03-04 – 2022-03-09 (×7): 5 mg via ORAL
  Filled 2022-03-03 (×7): qty 1

## 2022-03-03 MED ORDER — BISACODYL 10 MG RE SUPP: 10.0000 mg | Freq: Every day | RECTAL | Status: DC | PRN

## 2022-03-03 MED ORDER — HEPARIN (PORCINE) 25000 UT/250ML-% IV SOLN
1000.0000 [IU]/h | INTRAVENOUS | Status: DC
Start: 2022-03-03 — End: 2022-03-04
  Administered 2022-03-03: 1000 [IU]/h via INTRAVENOUS

## 2022-03-03 MED ORDER — AMIODARONE LOAD VIA INFUSION
150.0000 mg | Freq: Once | INTRAVENOUS | Status: DC
  Filled 2022-03-03: qty 83.34

## 2022-03-03 MED ORDER — HEPARIN BOLUS VIA INFUSION
3000.0000 [IU] | Freq: Once | INTRAVENOUS | Status: AC
  Administered 2022-03-03: 3000 [IU] via INTRAVENOUS
  Filled 2022-03-03: qty 3000

## 2022-03-03 NOTE — Progress Notes (Signed)
Discussed with Lilia Pro, RN medication compatibilities with current IV access.  At this time has enough access to meet needs.  Lilia Pro will re consult if another medication is ordered that is not compatible with current therapies.

## 2022-03-03 NOTE — Progress Notes (Signed)
ANTICOAGULATION CONSULT NOTE - Follow Up Consult  Pharmacy Consult for Heparin Indication: atrial fibrillation  Allergies  Allergen Reactions   Aspirin Other (See Comments)    Reports GI bleed--pt is currently taking   Lisinopril Cough   Sudafed [Pseudoephedrine Hcl]     Pt reports she had drainage in throat that made her throat hurt.     Patient Measurements: Height: '4\' 9"'$  (144.8 cm) Weight: 84.2 kg (185 lb 10 oz) IBW/kg (Calculated) : 38.6  Vital Signs: Temp: 98.6 F (37 C) (06/12 0737) Temp Source: Oral (06/12 0737) BP: 131/86 (06/12 0737) Pulse Rate: 121 (06/12 0737)  Labs: Recent Labs    02/28/22 2331 03/01/22 0550 03/02/22 0246  HGB  --  7.8* 7.7*  HCT  --  22.4* 22.9*  PLT  --  204 201  CREATININE 3.26* 2.92* 2.40*    Estimated Creatinine Clearance: 17.9 mL/min (A) (by C-G formula based on SCr of 2.4 mg/dL (H)).   Assessment: 76 year old female to begin heparin for Afib, with AKI and meningioma.  Received dose of sq heparin at 6 am  Goal of Therapy:  Heparin level 0.3-0.7 units/ml Monitor platelets by anticoagulation protocol: Yes   Plan:  Heparin 3000 units iv bolus x 1 Heparin drip at 1150 units / hr Heparin level in 6 hours Daily heparin level, CBC  Thank you Anette Guarneri, PharmD  03/03/2022,8:36 AM

## 2022-03-03 NOTE — Care Management Important Message (Signed)
Important Message  Patient Details  Name: Tara [name] MRN: [id number] Date of Birth: 18-Nov-1945   Medicare Important Message Given:  Yes     Orbie Pyo 03/03/2022, 3:48 PM

## 2022-03-03 NOTE — Consult Note (Addendum)
Cardiology Consultation:   Patient ID: Tara [name] MRN: [id number]; DOB: Aug 01, 1946  Admit date: 02/27/2022 Date of Consult: 03/03/2022  PCP:  Tara Helper, MD   [place] Providers Cardiologist:  Dr Aundra Dubin    Patient Profile:   Tara [name] is a 76 y.o. female with a PMH of anxiety with depression, breast cancer s/p right lumpectomy and chemoradiation 2008 and recurrence with right mastectomy and chemo 2019, hypertension, hyperlipidemia, meningioma, systolic heart failure/nonischemic cardiomyopathy with recovered EF 03/2021, presented to the ER 02/27/2022 with multiple complaints including slurred speech, confusion, shortness of breath, functional decline, decreased energy, admitted to ICU with AKI 2/2 pre-renal azotemia, leukocytosis of unclear etiology,increasing meningioma, cardiology is consulted on 03/03/2022 for the evaluation of A-fib with RVR and hypotension at the request of Dr Sloan Leiter.  History of Present Illness:   [name] with above past medical history presented to the ER 02/27/22 multiple complaints including slurred speech, confusion, shortness of breath, functional decline, decreased energy.   She had a history of short PR SVT, followed by Dr.  Rayann Heman, maintained on metoprolol and without symptoms. She follows Dr. Aundra Dubin outpatient for chronic systolic heart failure and nonischemic cardiomyopathy of unclear etiology.    Reportedly she was initially found to have low EF 40% in 2014 Echo.  Repeat Echo from 03/21/2016 with LVEF 35 to 40%.  Lexiscan stress 05/01/2016 was a high risk study with LVEF 34%, small defect of moderate severity present in the apical lateral and apex location, consistent with prior myocardial infarction or scar.  No ischemia identified. She was admitted in 09/2016 with PNA, DVT, and CHF exacerbation.  Echo at that time showed EF down to 20-25%.  Troponin was mildly elevated to around 0.6. She was diuresed and discharged. She suffered breast cancer  2008 and underwent lumpectomy with chemoradiation, unfortunately recurrence in 2019 and underwent right mastectomy with chemotherapy.  Right and left heart cath on 07/10/2017 showed mildly elevated PCWP, mild pulmonary hypertension, preserved cardiac output, no significant coronary disease. Cardiac MRI in 08/10/17 showed LVEF 49%, mild dilated RV with normal systolic function, no LGE, no definite evidence for prior MI, infiltrative disease, myocarditis.  Echocardiogram 02/10/2020 with LVEF 45% and grade 2 DD, mildly reduced RV, normal PASP, and aortic sclerosis.  Echo 04/04/2021 with LVEF improved to 42%, normal diastolic parameter, normal RV, moderately elevated PASP with RVSP 54.6 mmHg, mild LAE, trivial MR, moderate TR.  She is historically maintained on GDMT with spironolactone 25 mg daily, Toprol-XL 50 mg twice daily, Entresto 49/51 twice daily, Farxiga 10 mg daily.  She does not require diuretic at baseline.  She was last seen by Dr. Aundra Dubin 11/11/2021 was doing stable with NYHA class II symptoms and euvolemic on exam.   She presented to the ER 02/27/2022 complaining generalized weakness, slurred speech, fatigue, loss of energy, shortness of breath, and confusion.  Family states symptom has progressed over the past 2 weeks.  She was too weak to get out of bed 02/27/2022, and found on the floor that morning. She had been taking increased NSAIDs due to back pain recently.   Admission diagnostic revealed elevated creatinine 6.34. High sensitive troponin 26 >30.  BNP 157.  Total CK10 62.  Lactic acid WNL.  CBC with leukocytosis 20600 and anemia hemoglobin 10.5.  INR 1.1.  A1c 6.3%, flu and COVID-19 negative.  CT head revealed interval increase in the size of a previously noted planum sphenoidale meningioma, with interval development of hypodensity in the inferior left frontal  lobe, favored to represent sequela of remote infarct but possibly also representing edema related to the mass.  CT C-spine without fracture.   Chest x-ray revealed no acute cardiopulmonary process.    Patient was then admitted to hospital medicine for AKI on CKD IIIb, mild rhabdomyolysis, etiology for AKI was felt due to prerenal azotemia in the setting of Entresto/spironolactone/Jardiance/NSAID use.  Shortly transferred to ICU for consideration of RRT. She was given IVF and holding of GDMT for CHF. Renal index had significantly improved. She was given Decadron with resolved slurred speech, MRI of brain and NSGY consult pending. Echo repeated 02/28/2022 revealed LVEF 55%, no regional wall motion abnormality, grade 1 DD, normal RV, mild calcification of aortic valve.  Patient developed acute onset A-fib RVR 03/03/2022 morning, diltiazem infusion was started, blood pressure was not tolerating.  Cardiology is consulted today for further input.   Upon encounter, patient states she is feeling over fatigued. She is no longer having any slurred speech. Her husband and daughter are both at bedside. She states she did not sleep well last night. She remains eyes closed while talking. She denied any chest pain or pressure, heart palpitation, dizziness, syncope, or SOB. She states she does not want cardioversion unless it's absolutely indicated. She has IV on left arm and is difficult with venipuncture. BP was take on the left arm and left leg, daughter states numbers had been consistently low.    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   Anxiety    Breast cancer ([place]) 2008   right - s/p lumpectomy->chemo, radiation   Depression    Dysrhythmia    hx SVT   Family history of colon cancer    Family history of prostate cancer    GERD (gastroesophageal reflux disease)    H/O: hysterectomy    Hiatal hernia    History of cancer chemotherapy    History of radiation therapy    Hyperlipidemia    Hypertension 01/25/2018   Nonischemic cardiomyopathy ([place])    Personal history of  radiation therapy 01/05/209   SVT (supraventricular tachycardia) ([place])    short RP SVT documented 2/33   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[place])    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)   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    Past Surgical History:  Procedure Laterality Date   ABDOMINAL HYSTERECTOMY  1994   fibroids,    BIOPSY  06/19/2016   Procedure: BIOPSY;  Surgeon: Daneil Dolin, MD;  [facility];  Service: Endoscopy;;  gastric duodenum   BREAST BIOPSY Right 12/16/2006   malignant   BREAST EXCISIONAL BIOPSY Right 2018   benign lumpectomy   BREAST LUMPECTOMY Right 02/2007   BREAST LUMPECTOMY WITH RADIOACTIVE SEED LOCALIZATION Right 07/29/2017   Procedure: RIGHT BREAST LUMPECTOMY WITH RADIOACTIVE SEED LOCALIZATION;  Surgeon: Fanny Skates, MD;  Location: [facility];  Service: General;  Laterality: Right;   BREAST SURGERY Right 2008   lumpectomy, cancer   CARDIAC CATHETERIZATION     CHOLECYSTECTOMY  1999   COLONOSCOPY  2008   Dr. Oneida Alar: multiple polyps. Path not available at time of visit.    COLONOSCOPY WITH PROPOFOL N/A 04/25/2014   Dr. Oneida Alar: Multiple tubular adenomas removed. Diverticulosis. Moderate internal hemorrhoids. Next colonoscopy planned for August 2018.   ESOPHAGOGASTRODUODENOSCOPY (EGD) WITH PROPOFOL N/A 06/19/2016   Procedure:  ESOPHAGOGASTRODUODENOSCOPY (EGD) WITH PROPOFOL;  Surgeon: Daneil Dolin, MD;  [facility];  Service: Endoscopy;  Laterality: N/A;   MASTECTOMY W/ SENTINEL NODE BIOPSY Right 06/09/2018   Procedure: RIGHT TOTAL MASTECTOMY WITH SENTINEL LYMPH NODE BIOPSY;  Surgeon: Fanny Skates, MD;  Location: [facility];  Service: General;  Laterality: Right;   POLYPECTOMY N/A 04/25/2014   Procedure: POLYPECTOMY;  Surgeon: Danie Binder, MD;  Location: [facility];  Service: Endoscopy;  Laterality: N/A;  Ascending and Decending Colon x3 , Transverse colon x2, rectal   PORTACATH PLACEMENT N/A 06/09/2018    Procedure: INSERTION PORT-A-CATH;  Surgeon: Fanny Skates, MD;  Location: [facility];  Service: General;  Laterality: N/A;   RIGHT/LEFT HEART CATH AND CORONARY ANGIOGRAPHY N/A 07/10/2017   Procedure: RIGHT/LEFT HEART CATH AND CORONARY ANGIOGRAPHY;  Surgeon: Larey Dresser, MD;  Location: [facility];  Service: Cardiovascular;  Laterality: N/A;     Home Medications:  Prior to Admission medications   Medication Sig Start Date End Date Taking? Authorizing Provider  acetaminophen (TYLENOL) 500 MG tablet Take 500-1,000 mg by mouth every 6 (six) hours as needed (FOR PAIN.).   Yes [provider]  aspirin EC 81 MG tablet Take 81 mg by mouth daily.    Yes [provider]  azelastine (ASTELIN) 0.1 % nasal spray Place 1 spray into both nostrils daily as needed for rhinitis. Use in each nostril as directed   Yes [provider]  Cholecalciferol (VITAMIN D) 50 MCG (2000 UT) CAPS Take 2,000 Units by mouth daily.   Yes [provider]  clonazePAM (KLONOPIN) 0.5 MG tablet Take 1 tablet (0.5 mg total) by mouth at bedtime. 01/20/22  Yes Tara Helper, MD  docusate sodium (COLACE) 100 MG capsule Take 200 mg by mouth 2 (two) times daily.    Yes [provider]  JARDIANCE 10 MG TABS tablet TAKE ONE TABLET BY MOUTH ONCE DAILY. Patient taking differently: Take 10 mg by mouth daily. 01/03/22  Yes Larey Dresser, MD  metoCLOPramide (REGLAN) 10 MG tablet TAKE ONE TABLET BY MOUTH TWICE DAILY. Patient taking differently: Take 10 mg by mouth 2 (two) times daily. 02/24/22  Yes Tara Helper, MD  metoprolol succinate (TOPROL-XL) 50 MG 24 hr tablet Take 1 tablet (50 mg total) by mouth 2 (two) times daily. 01/29/22  Yes Larey Dresser, MD  montelukast (SINGULAIR) 10 MG tablet TAKE ONE TABLET BY MOUTH AT BEDTIME. Patient taking differently: Take 10 mg by mouth at bedtime. 09/03/21  Yes Tara Helper, MD  pantoprazole (PROTONIX) 40 MG tablet TAKE (1) TABLET BY  MOUTH TWICE DAILY. Patient taking differently: 40 mg 2 (two) times daily. 11/18/21  Yes Tara Helper, MD  Phenylephrine-[place] (PREPARATION H) 0.25-50 % GEL Apply 1 Application topically daily as needed (hemorrhoid).   Yes [provider]  rosuvastatin (CRESTOR) 20 MG tablet Take 1 tablet (20 mg total) by mouth daily. 01/14/22  Yes Tara Helper, MD  sacubitril-valsartan (ENTRESTO) 24-26 MG Take 1 tablet by mouth 2 (two) times daily. 04/05/21  Yes Larey Dresser, MD  sertraline (ZOLOFT) 25 MG tablet Take 1 tablet (25 mg total) by mouth daily. 01/20/22  Yes Tara Helper, MD  spironolactone (ALDACTONE) 25 MG tablet TAKE 1 TABLET BY MOUTH ONCE A DAY. NEEDS APPOINTMENT FOR FUTURE FILLS. Patient taking differently: Take 25 mg by mouth daily. 02/10/22  Yes Larey Dresser, MD  hydrocortisone (ANUSOL-HC) 2.5 % rectal cream APPLY RECTALLY TWICE  A DAY. Patient not taking: Reported on 02/27/2022 03/26/21   Tara Helper, MD  hydrocortisone (PROCTOZONE-HC) 2.5 % rectal cream Place 1 application rectally 2 (two) times daily. Patient not taking: Reported on 02/27/2022 10/09/21   Tara Helper, MD  hydrocortisone 2.5 % cream APPLY TO THE AFFECTED AREA(S) TWICE A DAY. Patient not taking: Reported on 02/27/2022 09/27/21   Tara Helper, MD  UNABLE TO FIND Cranial prothesis  DX [phone] L65.9 Patient not taking: Reported on 02/27/2022 11/01/21   Tara Helper, MD    Inpatient Medications: Scheduled Meds:  aspirin  81 mg Oral Daily   Chlorhexidine Gluconate Cloth  6 each Topical Q0600   clonazePAM  0.5 mg Oral QHS   dexamethasone (DECADRON) injection  4 mg Intravenous Q6H   insulin aspart  0-9 Units Subcutaneous Q4H   insulin glargine-yfgn  10 Units Subcutaneous QHS   lidocaine  1 patch Transdermal Q24H   montelukast  10 mg Oral QHS   pantoprazole  40 mg Oral Q1200   polyethylene glycol  17 g Oral BID   rosuvastatin  10 mg Oral Daily   senna  2 tablet Oral Daily    sertraline  25 mg Oral Daily   Continuous Infusions:  sodium chloride Stopped (02/28/22 1547)   amiodarone 60 mg/hr (03/03/22 0936)   Followed by   amiodarone     diltiazem (CARDIZEM) infusion 5 mg/hr (03/03/22 0742)   heparin 1,150 Units/hr (03/03/22 0915)   lactated ringers     lactated ringers     lactated ringers 100 mL/hr at 03/02/22 1652   PRN Meds: sodium chloride, acetaminophen, bisacodyl, docusate sodium, ondansetron (ZOFRAN) IV  Allergies:    Allergies  Allergen Reactions   Aspirin Other (See Comments)    Reports GI bleed--pt is currently taking   Lisinopril Cough   Sudafed [Pseudoephedrine Hcl]     Pt reports she had drainage in throat that made her throat hurt.     Social History:   Social History   Socioeconomic History   Marital status: Married    Spouse name: Not on file   Number of children: 2   Years of education: Not on file   Highest education level: Not on file  Occupational History   Occupation: disabled     Employer: UNEMPLOYED  Tobacco Use   Smoking status: Never   Smokeless tobacco: Never  Vaping Use   Vaping Use: Never used  Substance and Sexual Activity   Alcohol use: No   Drug use: No   Sexual activity: Not on file  Other Topics Concern   Not on file  Social History Narrative   Lives in [place] with family.  Does not routinely exercise.   Social Determinants of Health   Financial Resource Strain: Low Risk  (05/01/2021)   Overall Financial Resource Strain (CARDIA)    Difficulty of Paying Living Expenses: Not hard at all  Food Insecurity: No Food Insecurity (05/01/2021)   Hunger Vital Sign    Worried About Running Out of Food in the Last Year: Never true    Ran Out of Food in the Last Year: Never true  Transportation Needs: No Transportation Needs (05/01/2021)   PRAPARE - Hydrologist (Medical): No    Lack of Transportation (Non-Medical): No  Physical Activity: Insufficiently Active (05/01/2021)    Exercise Vital Sign    Days of Exercise per Week: 3 days    Minutes of Exercise per Session: 30 min  Stress: No Stress Concern Present (05/01/2021)   [place]    Feeling of Stress : Not at all  Social Connections: Moderately Integrated (05/01/2021)   Social Connection and Isolation Panel [NHANES]    Frequency of Communication with Friends and Family: More than three times a week    Frequency of Social Gatherings with Friends and Family: More than three times a week    Attends Religious Services: More than 4 times per year    Active Member of [employer] or Organizations: No    Attends Archivist Meetings: Never    Marital Status: Married  Human resources officer Violence: Not At Risk (05/01/2021)   Humiliation, Afraid, Rape, and Kick questionnaire    Fear of Current or Ex-Partner: No    Emotionally Abused: No    Physically Abused: No    Sexually Abused: No    Family History:    Family History  Problem Relation Age of Onset   Colon cancer Father 72       died at age 29   Hypertension Sister    Heart disease Sister    Cancer Sister        unknown form   Dementia Mother    Stroke Mother 43       left hemiparesis   Cancer Brother 65       prostate   Cancer Paternal Aunt        NOS   Lung cancer Paternal Uncle    Other Paternal Uncle        lightning strike   Other Paternal Uncle        hit by train   Other Paternal 38        old age   Cancer Cousin        NOS pat first cousin   Cancer Cousin        NOS pat first cousin   Prostate cancer Cousin        pat first cousin     ROS:  Constitutional: fatigue  Eyes: Denied vision change or loss Ears/Nose/Mouth/Throat: Denied ear ache, sore throat, coughing, sinus pain Cardiovascular: see HPI  Respiratory: see HPI  Gastrointestinal: Denied nausea, vomiting, abdominal pain, diarrhea Genital/Urinary: Denied dysuria, hematuria, urinary  frequency/urgency Musculoskeletal: generalized weakness  Skin: Denied rash, wound Neuro: Denied headache, dizziness, syncope Psych: history of depression/anxiety  Endocrine: Denied history of diabetes   Physical Exam/Data:   Vitals:   03/03/22 0500 03/03/22 0634 03/03/22 0737 03/03/22 1015  BP:   131/86 (!) 80/59  Pulse:   (!) 121 (!) 143  Resp: '18 18 17 '$ (!) 21  Temp:   98.6 F (37 C)   TempSrc:   Oral   SpO2:   94% 95%  Weight:      Height:        Intake/Output Summary (Last 24 hours) at 03/03/2022 1107 Last data filed at 03/03/2022 0000 Gross per 24 hour  Intake 1030.24 ml  Output 950 ml  Net 80.24 ml      03/03/2022    3:38 AM 03/02/2022    5:00 AM 03/01/2022    3:09 AM  Last 3 Weights  Weight (lbs) 185 lb 10 oz 183 lb 13.8 oz 175 lb 7.8 oz  Weight (kg) 84.2 kg 83.4 kg 79.6 kg     Body mass index is 40.17 kg/m.   Vitals:  Vitals:   03/03/22 0737 03/03/22 1015  BP: 131/86 (!) 80/59  Pulse: (!) 121 (!) 143  Resp: 17 (!) 21  Temp: 98.6 F (37 C)   SpO2: 94% 95%   General Appearance: In no apparent distress, laying in bed, fatigued  HEENT: Normocephalic, atraumatic.  Neck: Supple, trachea midline, no JVDs Cardiovascular: Irregularly irregular,  S1S2, no murmur  Respiratory: Resting breathing unlabored, lungs sounds clear but diminished to auscultation bilaterally, no use of accessory muscles. On [place] oxygen.  Gastrointestinal: Bowel sounds positive, abdomen sof Extremities: Able to move all extremities in bed, no pitting edema Musculoskeletal: Normal muscle bulk and tone Skin: Intact, warm, dry. No rashes or petechiae noted in exposed areas.  Neurologic: Alert, oriented to person, place and time. Fluent speech, no facial droop, no cognitive deficit Psychiatric: Normal affect. Mood is appropriate.  EKG:  The EKG was personally reviewed and demonstrates:  EKG with A fib RVR 155 bpm with non-specific ST-T abnormalities    Telemetry:  Telemetry was personally  reviewed and demonstrates:  A fib with RVR 120-145s currently   Relevant CV Studies:  Echo from 02/28/22:   1. Left ventricular ejection fraction, by estimation, is 55%. The left  ventricle has normal function. The left ventricle has no regional wall  motion abnormalities. Left ventricular diastolic parameters are consistent  with Grade I diastolic dysfunction  (impaired relaxation).   2. Peak RV-RA gradient 25 mmHg. Right ventricular systolic function is  normal. The right ventricular size is normal.   3. The mitral valve is normal in structure. No evidence of mitral valve  regurgitation. No evidence of mitral stenosis.   4. The aortic valve is tricuspid. There is mild calcification of the  aortic valve. Aortic valve regurgitation is not visualized. No aortic  stenosis is present.    Echo from 04/04/21:   1. EF improved since echo done 02/10/20 . Left ventricular ejection  fraction, by estimation, is 55%. The left ventricle has normal function.  The left ventricle has no regional wall motion abnormalities. Left  ventricular diastolic parameters were normal.  The average left ventricular global longitudinal strain is -21.7 %. The  global longitudinal strain is normal.   2. Right ventricular systolic function is normal. The right ventricular  size is normal. There is moderately elevated pulmonary artery systolic  pressure.   3. Left atrial size was mildly dilated.   4. The mitral valve is normal in structure. Trivial mitral valve  regurgitation. No evidence of mitral stenosis.   5. Tricuspid valve regurgitation is moderate.   6. The aortic valve is tricuspid. Aortic valve regurgitation is not  visualized. No aortic stenosis is present.   7. The inferior vena cava is normal in size with greater than 50%  respiratory variability, suggesting right atrial pressure of 3 mmHg.    Cardiac MRI from 08/10/17: IMPRESSION: 1.  Normal LV size with EF 49%, diffuse hypokinesis.   2.  Mildly  dilated RV with normal systolic function.   3. No myocardial LGE, so no definitive evidence for prior MI, infiltrative disease, or myocarditis.  Right and left heart cath 07/10/17:  1. Mildly elevated PCWP, mild pulmonary hypertension.  2. Preserved cardiac output.  3. No significant coronary disease, nonischemic cardiomyopathy.    Laboratory Data:  High Sensitivity Troponin:   Recent Labs  Lab 02/27/22 1438 02/27/22 2156 03/03/22 0816  TROPONINIHS 26* 30* 26*     Chemistry Recent Labs  Lab 02/28/22 1907 02/28/22 2331 03/01/22 0550 03/02/22 0246 03/03/22 0816  NA 137 137 136 139 138  K 2.5* 2.8*  3.7 4.2 4.2  CL 102 102 98 102 107  CO2 20* '23 26 26 22  '$ GLUCOSE 171* 132* 122* 136* 106*  BUN 69* 65* 60* 49* 36*  CREATININE 3.47* 3.26* 2.92* 2.40* 1.65*  CALCIUM 7.9* 7.7* 7.5* 8.0* 7.8*  MG 1.7 2.6* 2.5*  --   --   GFRNONAA 13* 14* 16* 20* 32*  ANIONGAP '15 12 12 11 9    '$ Recent Labs  Lab 02/27/22 1438 02/28/22 0113 03/03/22 0816  PROT 7.2  --  4.7*  ALBUMIN 3.5 2.9* 2.2*  AST 34  --  43*  ALT 22  --  21  ALKPHOS 78  --  41  BILITOT 0.6  --  0.6   Lipids No results for input(s): "CHOL", "TRIG", "HDL", "LABVLDL", "LDLCALC", "CHOLHDL" in the last 168 hours.  Hematology Recent Labs  Lab 03/01/22 0550 03/02/22 0246 03/03/22 0816  WBC 17.1* 22.5* 23.6*  RBC 2.86* 2.82* 2.77*  HGB 7.8* 7.7* 7.9*  HCT 22.4* 22.9* 23.3*  MCV 78.3* 81.2 84.1  MCH 27.3 27.3 28.5  MCHC 34.8 33.6 33.9  RDW 16.1* 16.8* 17.2*  PLT 204 201 176   Thyroid  Recent Labs  Lab 03/03/22 0816  TSH 0.429    BNP Recent Labs  Lab 02/27/22 1659  BNP 157.6*    DDimer No results for input(s): "DDIMER" in the last 168 hours.   Radiology/Studies:  ECHOCARDIOGRAM COMPLETE  Result Date: 02/28/2022    ECHOCARDIOGRAM REPORT   Patient Name:   KEANDRIA BERROCAL Smallman Date of Exam: 02/28/2022 Medical Rec #:  [id number]      Height:       57.0 in Accession #:    [id number]     Weight:       164.9 lb  Date of Birth:  1945/12/24      BSA:          1.657 m Patient Age:    82 years       BP:           132/70 mmHg Patient Gender: F              HR:           63 bpm. Exam Location:  Inpatient Procedure: 2D Echo, Cardiac Doppler, Color Doppler and Intracardiac            Opacification Agent Indications:    CHF-acute systolic  History:        Patient has prior history of Echocardiogram examinations. Risk                 Factors:Hypertension and Dyslipidemia. DOE. GERD. Hx breast                 cancer.  Sonographer:    Clayton Lefort RDCS (AE) Referring Phys: [id number] [facility] M REESE  Sonographer Comments: Technically difficult study due to poor echo windows. Image acquisition challenging due to respiratory motion. Patient sleeping and unable to hold breath to reduce respiratory motion. IMPRESSIONS  1. Left ventricular ejection fraction, by estimation, is 55%. The left ventricle has normal function. The left ventricle has no regional wall motion abnormalities. Left ventricular diastolic parameters are consistent with Grade I diastolic dysfunction (impaired relaxation).  2. Peak RV-RA gradient 25 mmHg. Right ventricular systolic function is normal. The right ventricular size is normal.  3. The mitral valve is normal in structure. No evidence of mitral valve regurgitation. No evidence of mitral stenosis.  4. The aortic valve is tricuspid.  There is mild calcification of the aortic valve. Aortic valve regurgitation is not visualized. No aortic stenosis is present. FINDINGS  Left Ventricle: Left ventricular ejection fraction, by estimation, is 55%. The left ventricle has normal function. The left ventricle has no regional wall motion abnormalities. Definity contrast agent was given IV to delineate the left ventricular endocardial borders. The left ventricular internal cavity size was normal in size. There is no left ventricular hypertrophy. Left ventricular diastolic parameters are consistent with Grade I diastolic dysfunction  (impaired relaxation). Right Ventricle: Peak RV-RA gradient 25 mmHg. The right ventricular size is normal. No increase in right ventricular wall thickness. Right ventricular systolic function is normal. Left Atrium: Left atrial size was normal in size. Right Atrium: Right atrial size was normal in size. Pericardium: There is no evidence of pericardial effusion. Mitral Valve: The mitral valve is normal in structure. No evidence of mitral valve regurgitation. No evidence of mitral valve stenosis. Tricuspid Valve: The tricuspid valve is normal in structure. Tricuspid valve regurgitation is trivial. Aortic Valve: The aortic valve is tricuspid. There is mild calcification of the aortic valve. Aortic valve regurgitation is not visualized. No aortic stenosis is present. Aortic valve mean gradient measures 3.0 mmHg. Aortic valve peak gradient measures 5.7 mmHg. Aortic valve area, by VTI measures 2.87 cm. Pulmonic Valve: The pulmonic valve was normal in structure. Pulmonic valve regurgitation is not visualized. Aorta: The aortic root is normal in size and structure. Venous: The inferior vena cava was not well visualized. IAS/Shunts: No atrial level shunt detected by color flow Doppler.  LEFT VENTRICLE PLAX 2D LVIDd:         4.60 cm   Diastology LVIDs:         2.90 cm   LV e' medial:    9.32 cm/s LV PW:         0.90 cm   LV E/e' medial:  7.2 LV IVS:        1.00 cm   LV e' lateral:   10.30 cm/s LVOT diam:     2.20 cm   LV E/e' lateral: 6.5 LV SV:         70 LV SV Index:   42 LVOT Area:     3.80 cm  RIGHT VENTRICLE RV Basal diam:  3.40 cm RV S prime:     16.50 cm/s TAPSE (M-mode): 2.4 cm LEFT ATRIUM             Index        RIGHT ATRIUM           Index LA diam:        3.70 cm 2.23 cm/m   RA Area:     15.50 cm LA Vol (A2C):   38.4 ml 23.17 ml/m  RA Volume:   39.70 ml  23.96 ml/m LA Vol (A4C):   47.1 ml 28.42 ml/m LA Biplane Vol: 44.6 ml 26.92 ml/m  AORTIC VALVE AV Area (Vmax):    2.71 cm AV Area (Vmean):   2.66 cm AV  Area (VTI):     2.87 cm AV Vmax:           119.00 cm/s AV Vmean:          77.500 cm/s AV VTI:            0.242 m AV Peak Grad:      5.7 mmHg AV Mean Grad:      3.0 mmHg LVOT Vmax:  84.90 cm/s LVOT Vmean:        54.300 cm/s LVOT VTI:          0.183 m LVOT/AV VTI ratio: 0.76  AORTA Ao Root diam: 2.90 cm Ao Asc diam:  2.90 cm MITRAL VALVE               TRICUSPID VALVE MV Area (PHT): 2.35 cm    TR Peak grad:   25.4 mmHg MV Decel Time: 323 msec    TR Vmax:        252.00 cm/s MV E velocity: 66.90 cm/s MV A velocity: 72.30 cm/s  SHUNTS MV E/A ratio:  0.93        Systemic VTI:  0.18 m                            Systemic Diam: 2.20 cm Dalton McleanMD Electronically signed by Franki Monte Signature Date/Time: 02/28/2022/3:47:32 PM    Final    US RENAL  Result Date: 02/27/2022 CLINICAL DATA:  Acute kidney injury EXAM: RENAL / URINARY TRACT ULTRASOUND COMPLETE COMPARISON:  None Available. FINDINGS: Right Kidney: Renal measurements: 8.9 x 3.8 x 3.6 cm = volume: 62.86 mL. Mildly increased renal cortical echogenicity. No mass or hydronephrosis visualized. Left Kidney: Renal measurements: 7.8 x 5.1 x 4.3 cm = volume: 87.75 mL. Mildly increased renal cortical echogenicity. No mass or hydronephrosis visualized. Bladder: Appears normal for degree of bladder distention. Other: None. IMPRESSION: No hydronephrosis.  Findings of medical renal disease. Electronically Signed   By: Maurine Simmering M.D.   On: 02/27/2022 18:44   DG Chest 2 View  Result Date: 02/27/2022 CLINICAL DATA:  Dyspnea on exertion. EXAM: CHEST - 2 VIEW COMPARISON:  Chest x-ray 06/09/2018. FINDINGS: Heart is mildly enlarged, unchanged. Calcified right mediastinal and right hilar lymph nodes are again seen. There is mild central pulmonary vascular congestion. There is no focal lung infiltrate, pleural effusion or pneumothorax. No acute fractures are seen. Right axillary surgical clips are present. IMPRESSION: 1. No acute cardiopulmonary process. 2. Mild  cardiomegaly. 3. Old granulomatous disease. Electronically Signed   By: Ronney Asters M.D.   On: 02/27/2022 18:07   CT HEAD WO CONTRAST  Result Date: 02/27/2022 CLINICAL DATA:  Slurred speech weakness EXAM: CT HEAD WITHOUT CONTRAST TECHNIQUE: Contiguous axial images were obtained from the base of the skull through the vertex without intravenous contrast. RADIATION DOSE REDUCTION: This exam was performed according to the departmental dose-optimization program which includes automated exposure control, adjustment of the mA and/or kV according to patient size and/or use of iterative reconstruction technique. COMPARISON:  09/28/2016 FINDINGS: Brain: Redemonstrated planum sphenoidale meningioma, which is not well evaluated given the absence of intravenous contrast, which measures approximately 3.3 x 2.9 x 2.4 cm cm (AP x TR x CC) (series 6, image 30 and series 5, image 21), previously 3.4 x 2.5 x 2.1 cm on 09/28/2016 when remeasured similarly. Hypodensity in the adjacent left frontal lobe (series 5, image 17 and series 3, image 12), which is favored to represent the sequela of remote infarct but could also represent edema associated with the mass. No evidence of acute infarction, hemorrhage, or midline shift. No hydrocephalus or extra-axial fluid collection. Vascular: No hyperdense vessel. Atherosclerotic calcifications in the intracranial carotid and vertebral arteries. Skull: Normal. Negative for fracture or focal lesion. Sinuses/Orbits: Mucosal thickening in the maxillary sinuses. The orbits are unremarkable. Other: The mastoid air cells are well aerated. IMPRESSION: 1. Interval increase  in the size of a previously noted planum sphenoidale meningioma, with interval development of hypodensity in the inferior left frontal lobe, favored to represent sequela of remote infarct but possibly also representing edema related to the mass. An MRI with and without contrast is recommended for further evaluation. 2. No evidence  of acute infarct. These results were called by telephone at the time of interpretation on 02/27/2022 at 3:51 pm to provider [place] , who verbally acknowledged these results. Electronically Signed   By: Merilyn Baba M.D.   On: 02/27/2022 15:51   CT Cervical Spine Wo Contrast  Result Date: 02/27/2022 CLINICAL DATA:  Neck trauma, after a fall off the bed) EXAM: CT CERVICAL SPINE WITHOUT CONTRAST TECHNIQUE: Multidetector CT imaging of the cervical spine was performed without intravenous contrast. Multiplanar CT image reconstructions were also generated. RADIATION DOSE REDUCTION: This exam was performed according to the departmental dose-optimization program which includes automated exposure control, adjustment of the mA and/or kV according to patient size and/or use of iterative reconstruction technique. COMPARISON:  None Available. FINDINGS: Alignment: Minimal retrolisthesis at C4-C5. Skull base and vertebrae: No acute fracture. No primary bone lesion or focal pathologic process. Moderate cervical spondylosis with prominent marginal osteophytes, most severe at C4-C5. Posterior osteophyte ridge causes abutment of the ventral cervical cord. Soft tissues and spinal canal: No prevertebral fluid or swelling. No visible canal hematoma. Disc levels:  There is some narrowing of the disc space at C4-C5. Upper chest: Negative. Other: None. IMPRESSION: No fracture, subluxation or dislocation. Moderate cervical spondylosis most prominent at C4-C5 with minimal retrolisthesis, prominent marginal osteophytes and decrease in the height of the disc space. Electronically Signed   By: Frazier Richards M.D.   On: 02/27/2022 15:41     Assessment and Plan:   New onset of A fib with RVR Hx of short PR SVT (remotely) Hypotension - new onset 6-7am on 03/03/22 per telemetry  - she is asymptomatic at this time - TSH WNL, added Mag, K >4 at goal  - Echo from 02/28/22 with stable LVEF 55% , no RWMA, grade I DD, normal RV, no significant  valvular disease  - BP low 80s limiting rate controlling agents, started amiodarone gtt now - received 500ML LR this morning, will add additional 558m LR now, then IVF at 1074mhr, monitor BP response - if BP remains low and rate control not improving, will need cardioversion, no TEE as duration is less than 48 hours - recommend ICU transfer with possible arterial line insertion for accurate BP monitor, discussed with hospitalist and ICU - CHA2DS2VASc 7, anticoagulation has been started by hospitlaist with heparin gtt, would close monitor for bleeding given increasing size meningioma on CT at admission with episode of slurred speech  Chronic systolic heart failure Non-ischemic cardiomyopathy - presented with weakness, fatigue, slurred speech, SOB in the setting of AKI  - LVEF has recovered since 04/04/21 Echo to 55%  - right and left heart cath 2018 no CAD - cardiac MRI 2018 no LGE - clinically euvolemic, historically not on routine diuretics, AKI etiology is not consistent with CRS, but pre-renal azotemia in the setting of increased NSAID use, renal index improving with IVF and holding nephrotoxic agents  - GDMT: resume Metoprolol XL '50mg'$  BID when BP allows, resume spironolactone/Entresto/Jardiance when AKI resovles   AKI on CKD IIIb, improving  Meningioma, increasing in size per CT Anemia, worsening  Leukocytosis of unclear etiology  HTN HLD Pre-diabetes Depression/anxiety Debility  Hx of breast cancer - per IM /  ICU     Risk Assessment/Risk Scores:   New York Heart Association (NYHA) Functional Class NYHA Class I  CHA2DS2-VASc Score = 7   This indicates a 11.2% annual risk of stroke. The patient's score is based upon: CHF History: 1 HTN History: 1 Diabetes History: 0 Stroke History: 2 Vascular Disease History: 0 Age Score: 2 Gender Score: 1     For questions or updates, please contact [place] Please consult [email or web address] for contact info under     Signed, Margie Billet, NP  03/03/2022 11:07 AM   Personally seen and examined. Agree with APP above with the following comments:  Briefly 76 yo F  with a history of HF recovered EF (echo this admission) HTN & HLD Breast Cancer s/p R lumpectomy and chemo (unclear) and high dose radiation who presents with slurred speech, found to have AKI, and potentially a menginioma with increasing size.  Had improvement in AKI and was able to leave ICU; found to have AF RVR this AM  Patient notes that she was doing well at the beginning of the year; normal BP, on GDMT, and need not baseline lasix.  Was doing well until 02/26/22.  Family had noted that she was continuing to get weak and suspect she had fallen.  Had some back pain and was taking NSAIDs. No CP, no SOB no Palpitations.  Unclear syncope (she was found on floor AM of 02/27/22).  This AM she had sudden onset AF RVR.  Put on cardizem.  Was given IV Cardizem and with low BP.  With she had low BP; BP cannot be taken on the R arm due to prior cancer; she has had low BP in left arm with reasonable leg correlation (was on lower leg at time of eval).  Exam notable for IRIR tachycardia, morbidly obese, in NAD No murmur, normal RR rate, no edema, moves all extremities.  Labs notable for Creatinine improvement to 1.65, Albumin 2.2, WBC continues to increase despite decadron start of 02/28/22 EKG AF RVR with inferolateral ST depression Tele: AF RVR  For key conditions including  Hypotension- stat Lacate ordered New PAF CHADSVASC 7 Heart failure with recovered EF Meningioma with increased size (OK for AC per neurosurgery) Morbid obesity with protein calorie malnutrition AKI on CKD Hx of breast cancer with chemo-radiation (no BP left arm)  Would recommend  - Hypotension may be related to cardizem; if this doesn't resolve we should look for mutli-factorial causes - a porting may be related to her AF RVR; I re-bolused amiodarone; she is AOX3 and  declined urgent DCCV; we will re-engage with this bas on her clinical course (daughter notes that patient is full code but does not want cardioversion, discussed with patient and family) - she would not tolerate AV nodal agents at this time; agree with IV fluid, will hope to add back her metoprolol succinate (unclear why BID) - rest as above.  Rudean Haskell, MD Cardiologist [facility], [address] [place], [place] [address] [phone]  11:27 AM

## 2022-03-03 NOTE — Progress Notes (Signed)
Brief note: A-fib RVR this morning-Cardizem infusion was started for barely a minute or so-blood pressure has dropped.  Discussed with neurosurgery-okay to start IV heparin.  Given IVF-blood pressure still soft but slightly improved-in the high 80s/low 90s range-awaiting cardiology input but will go ahead and start amiodarone infusion.

## 2022-03-03 NOTE — Progress Notes (Signed)
NAME:  Tara [name], MRN:  [id number], DOB:  Nov 21, 1945, LOS: 4 ADMISSION DATE:  02/27/2022 CONSULTATION DATE:  02/27/2022 REFERRING MD:  [place] - EDP CHIEF COMPLAINT:  Acute renal failure   History of Present Illness:  76 year old woman who presented to [facility] ED 6/8 with weakness, confusion and progressive worsening of slurred speech. Reportedly fell out of bed overnight and was unable to get back into bed unassisted. Found on the floor 6/8AM, alert. Family reports fatigue and progressive confusion/slurred speech for the last 1-2 weeks. 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   On ED arrival, patient was noted to have slurred speech without HA, unilateral strength deficit or facial droop. Reported vomiting over the last 1-2 days, more episodes today. Denies fever/chills, CP/SOB, cough/rhinorrhea, diarrhea, dysuria, HA. Endorses generalized weakness and constipation as well as urinary urgency. Afebrile on ED arrival with HR 65, BP soft 91/51 (MAP 64). Labs notable for WBC 20.6 (85% N), Na 143, K 3.7, bicarb < 7, Cr 6.34 (baseline 1.5-1.7), BUN 97; LFTs WNL. Trop mildly elevated at 26, BNP 157. LA 1.2. VBG with pH 7.06, bicarb 9.2. CXR demonstrating old granulomatous disease (remote TB), Renal US without hydronephrosis. CT C-spine negative. CT Head showed interval increase in known planum sphenoidale meningioma with sequelae of ?edema r/t mass. NSGY was consulted and dexamethasone initiated. Unable to obtain contrasted MRI at present due to ARF. Nephrology consulted for ARF with aim to manage without RRT.  PCCM consulted for ICU admission in the setting of acidosis and ARF.  Tx out of ICU 6/10.  6/12 had episode of AFRVR for which was started on Cardizem. Unfortunately developed hypotension. Cardizem stopped. Fluid bolus and  amiodarone bolus started. Cardiology consulted and requested transfer to ICU.   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   Anxiety    Breast cancer ([place]) 2008   right - s/p lumpectomy->chemo, radiation   Depression    Dysrhythmia    hx SVT   Family history of colon cancer    Family history of prostate cancer    GERD (gastroesophageal reflux disease)    H/O: hysterectomy    Hiatal hernia    History of cancer chemotherapy    History of radiation therapy    Hyperlipidemia    Hypertension 01/25/2018   Nonischemic cardiomyopathy ([place])    Personal history of radiation therapy 01/05/209   SVT (supraventricular tachycardia) ([facility])    short RP SVT documented 4/26   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[place])    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 Progressive weakness, fatigue, slurred speech over 1-2 weeks. Vomiting x 1-2 days. CT Head with increased size of known meningioma with ?sequelae of edema r/t mass. NSGY consulted. ARF suspected to be prerenal, Nephro consulted. PCCM consulted for admission. 6/10 transfer out of ICU 6/12 AFRVR with HoTN after Cardizem. Started on IVF and Amio, cards consulted and requesting tx to ICU with possible  cardioversion  Interim History / Subjective:  BP responding gradually to fluids. HR still up but improved from prior, currently on amio. She is asymptomatic. Unsure whether she would want cardioversion, discussing with family.  Objective:  Blood pressure (!) 80/59, pulse (!) 143, temperature 98.6 F (37 C), temperature source Oral, resp. rate (!) 21, height '4\' 9"'$   (1.448 m), weight 84.2 kg, SpO2 95 %.        Intake/Output Summary (Last 24 hours) at 03/03/2022 1119 Last data filed at 03/03/2022 0000 Gross per 24 hour  Intake 1030.24 ml  Output 950 ml  Net 80.24 ml    Filed Weights   03/01/22 0309 03/02/22 0500 03/03/22 0338  Weight: 79.6 kg 83.4 kg 84.2 kg    Physical Examination: General: Adult female, resting in bed, in NAD. Neuro: A&O x 3, no deficits. HEENT: [place]/AT. Sclerae anicteric. EOMI. Cardiovascular: IRIR, tachy, no M/R/G.  Lungs: Respirations even and unlabored.  CTA bilaterally, No W/R/R. Abdomen: BS x 4, soft, NT/ND.  Musculoskeletal: No gross deformities, no edema.  Skin: Intact, warm, no rashes.   Assessment & Plan:   New onset AFRVR with HoTN after Cardizem - ? 2/2 slight hypomagnesemia. Hx chronic HFpEF, HTN, HLD. - Agree stopping Cardizem. - Appreciate cards assistance. - 2g Mag now. - Continue Amiodarone per cards. - Continue empiric Heparin gtt (TRH discussed with NSGY). - Continue gentle fluids. - Might need DCCV, pt still unsure if would want but discussing with family. - Continue home ASA. - Hold Entresto, Aldactone and Jardiance, Toprol-XL, statin in the setting of ARF.  AKI on CKD stage IIIb in the setting of medication including Entresto/Aldactone/Jardiance and ibuprofen, exacerbated by severe dehydration which is now improved. Renal US negative for hydro and UOP now improved. - Continue supportive care. - Avoid NSAIDs and other nephrotoxic agents. - Monitor intake and output. - Follow BMP.  Leukocytosis - unclear etiology but no signs of infection and afebrile. Likely reactive. - Closely monitor clinically. - Trend fever curve / CBC.  Meningioma of planum sphenoidale - CT Head 6/8 showing interval increase of previously noted planum sphenoidale meningioma, interval development of hypodensity in the inferior left frontal lobe, favored to represent sequela of remote infarct but possibly also representing  edema related to the mass. - Neurosurgery recommending MRI with and without contrast once kidney function normalized. - Continue Decadron '4mg'$  Q6H. - She will follow neurosurgery as an outpatient  GERD. - Continue PPI.  Hx Prediabetes (A1c 6.3%.). - SSI. - Holding Jardiance in the setting of ARF.  Hx Depression/anxiety. - Hold home Zoloft, Klonipin for now.  Best Practice: (right click and "Reselect all SmartList Selections" daily)   Diet/type: Regular consistency DVT prophylaxis: systemic heparin GI prophylaxis: PPI Lines: N/A Foley:  N/A Code Status:  full code Last date of multidisciplinary goals of care discussion: Discussed with and upated daughter and husband at bedside as well as Dr. Gasper Sells of Cardiology 6/12.    CC time: 35 min.   Montey Hora, [facility] For pager details, please see AMION or use Epic chat  After 1900, please call [place] for cross coverage needs 03/03/2022, 11:42 AM

## 2022-03-03 NOTE — Progress Notes (Signed)
PT Cancellation Note  Patient Details Name: Tara [name] MRN: [id number] DOB: 11/02/1945   Cancelled Treatment:    Reason Eval/Treat Not Completed: (P) Patient not medically ready Pt transferred to higher level of care, will defer PT tx until pt medically stable.  Akiba Melfi M Kathya Wilz 03/03/2022, 11:59 AM

## 2022-03-03 NOTE — Progress Notes (Addendum)
PROGRESS NOTE        PATIENT DETAILS Name: Tara [name] Age: 76 y.o. Sex: female Date of Birth: Apr 08, 1946 Admit Date: 02/27/2022 Admitting Physician Julian Hy, DO IOX:[id number], Norwood Levo, MD  Brief Summary: Patient is a 76 y.o.  female with a history of HTN, HLD, CKD stage IIIb, chronic HFpEF, meningioma, breast CA-s/p right lumpectomy and chemoradiation in 2008, depression/anxiety who presented with weakness/fatigue/confusion/slurred speech-she was found to have AKI-and initially admitted to the ICU for possible RRT.  However with supportive care-renal function improved-she was subsequently [facility] on 6/11.  Significant events: 6/08>> presented to ED with weakness/fatigue/confusion/slurred sleep-AKI-meningioma.  Admitted by PCCM to ICU 6/11>> transfer to [facility] 6/12>> A-fib RVR with hypotension.  Amiodarone infusion started.  Significant studies: 6/08>> CT head: Interval increase in size of previously noted planum sphenoidal meningioma. 6/08>> CT C-spine: No fracture or dislocation 6/08>> CXR: No cardiopulmonary disease. 6/08>> renal ultrasound: No hydronephrosis. 6/09>> Echo: EF 55% 6/12>> TSH: 0.429  Significant microbiology data: 6/08>> influenza/COVID PCR: Negative  Procedures: None  Consults: Nephrology, PCCM, cardiology  Subjective: Sleepy-persistent A-fib RVR with hypotension.  No chest pain-no shortness of breath.  Objective: Vitals: Blood pressure (!) 80/59, pulse (!) 143, temperature 98.6 F (37 C), temperature source Oral, resp. rate (!) 21, height '4\' 9"'$  (1.448 m), weight 84.2 kg, SpO2 95 %.   Exam: Gen Exam:Alert awake-not in any distress HEENT:atraumatic, normocephalic Chest: B/L clear to auscultation anteriorly CVS:S1S2 irregular-tachycardic. Abdomen:soft non tender, non distended Extremities:no edema Neurology: Non focal Skin: no rash   Pertinent Labs/Radiology:    Latest Ref Rng &  Units 03/03/2022    8:16 AM 03/02/2022    2:46 AM 03/01/2022    5:50 AM  CBC  WBC 4.0 - 10.5 K/uL 23.6  22.5  17.1   Hemoglobin 12.0 - 15.0 g/dL 7.9  7.7  7.8   Hematocrit 36.0 - 46.0 % 23.3  22.9  22.4   Platelets 150 - 400 K/uL 176  201  204     Lab Results  Component Value Date   NA 138 03/03/2022   K 4.2 03/03/2022   CL 107 03/03/2022   CO2 22 03/03/2022      Assessment/Plan: AKI on CKD stage IIIb: AKI hemodynamically mediated in the setting of hypotension/Entresto/Aldactone/Jardiance and NSAID use.  Renal function continues to improve-avoid nephrotoxic agents.  Follow electrolytes.    A-fib with RVR hypotension: Per nursing staff-briefly started on Cardizem earlier-with development of persistent hypotension.  Have started amiodarone infusion-appreciate cardiology input-plans are to transfer to ICU for closer monitoring and possible cardioversion.  After discussion with neurosurgery (meningioma with vasogenic edema)-have started IV heparin as CHA2DS2-VASc score of around 6.  Meningioma possible vasogenic edema: Slurred speech has resolved-continue Decadron-MRI brain with/without contrast planned once renal function improves further.  Neurosurgery following.  Normocytic anemia: Due to CKD-worsened due to AKI and IVF administration.  No indications for transfusion-watch closely.  Leukocytosis: Likely due to steroid use-no indication for infection.  Chronic HFpEF: Volume status stable-hypotensive this morning-continue to hold Entresto/Aldactone on Jardiance.  HTN: All antihypertensives on hold as developed hypotension with A-fib RVR.  HLD: On Crestor.  Prediabetes (A1c 6.3 on 6/8): CBG stable on SSI  Recent Labs    03/03/22 0014 03/03/22 0348 03/03/22 0742  GLUCAP 121* 107* 103*     Depression/anxiety: Resume Zoloft/Klonopin.  GERD: Continue PPI  Constipation: No response to MiraLAX/senna-once more hemodynamically stable-probably will require Dulcolax  suppository.  Morbid Obesity: Estimated body mass index is 40.17 kg/m as calculated from the following:   Height as of this encounter: '4\' 9"'$  (1.448 m).   Weight as of this encounter: 84.2 kg.   Code status:   Code Status: Full Code   DVT Prophylaxis: SCDs Start: 02/27/22 2010   Family Communication: Spouse at bedside   Disposition Plan: Status is: Inpatient Remains inpatient appropriate because: Resolving AKI-not yet stable for discharge.  PT recommending SNF.   Planned Discharge Destination:Skilled nursing facility  The patient is critically ill with multiple organ system failure and requires high complexity decision making for assessment and support, frequent evaluation and titration of therapies, advanced monitoring, review of radiographic studies and interpretation of complex data.     Diet: Diet Order             Diet heart healthy/carb modified Room service appropriate? Yes; Fluid consistency: Thin  Diet effective now                     Antimicrobial agents: Anti-infectives (From admission, onward)    None        MEDICATIONS: Scheduled Meds:  aspirin  81 mg Oral Daily   Chlorhexidine Gluconate Cloth  6 each Topical Q0600   clonazePAM  0.5 mg Oral QHS   dexamethasone (DECADRON) injection  4 mg Intravenous Q6H   insulin aspart  0-9 Units Subcutaneous Q4H   insulin glargine-yfgn  10 Units Subcutaneous QHS   lidocaine  1 patch Transdermal Q24H   montelukast  10 mg Oral QHS   pantoprazole  40 mg Oral Q1200   polyethylene glycol  17 g Oral BID   rosuvastatin  10 mg Oral Daily   senna  2 tablet Oral Daily   sertraline  25 mg Oral Daily   Continuous Infusions:  sodium chloride Stopped (02/28/22 1547)   amiodarone 60 mg/hr (03/03/22 0936)   Followed by   amiodarone     amiodarone     diltiazem (CARDIZEM) infusion 5 mg/hr (03/03/22 0742)   heparin 1,150 Units/hr (03/03/22 0915)   lactated ringers     lactated ringers     lactated ringers 100  mL/hr at 03/02/22 1652   PRN Meds:.sodium chloride, acetaminophen, bisacodyl, docusate sodium, ondansetron (ZOFRAN) IV   I have personally reviewed following labs and imaging studies  LABORATORY DATA: CBC: Recent Labs  Lab 02/27/22 1438 02/27/22 1512 02/27/22 2156 02/28/22 0138 03/01/22 0550 03/02/22 0246 03/03/22 0816  WBC 20.6*  --  17.7* 15.8* 17.1* 22.5* 23.6*  NEUTROABS 17.6*  --   --   --   --   --   --   HGB 10.5*   < > 9.5* 8.6* 7.8* 7.7* 7.9*  HCT 33.1*   < > 28.2* 25.1* 22.4* 22.9* 23.3*  MCV 87.6  --  84.7 81.0 78.3* 81.2 84.1  PLT 259  --  240 222 204 201 176   < > = values in this interval not displayed.     Basic Metabolic Panel: Recent Labs  Lab 02/27/22 2156 02/28/22 0113 02/28/22 1203 02/28/22 1907 02/28/22 2331 03/01/22 0550 03/02/22 0246 03/03/22 0816  NA 145 144   < > 137 137 136 139 138  K 3.5 3.1*   < > 2.5* 2.8* 3.7 4.2 4.2  CL 117* 113*   < > 102 102 98 102 107  CO2 8* 10*   < >  20* '23 26 26 22  '$ GLUCOSE 100* 99   < > 171* 132* 122* 136* 106*  BUN 95* 88*   < > 69* 65* 60* 49* 36*  CREATININE 5.71* 5.11*   < > 3.47* 3.26* 2.92* 2.40* 1.65*  CALCIUM 8.9 8.8*   < > 7.9* 7.7* 7.5* 8.0* 7.8*  MG 2.4 2.2  --  1.7 2.6* 2.5*  --   --   PHOS 7.4* 6.2*  --  3.0  --  2.5  --   --    < > = values in this interval not displayed.     GFR: Estimated Creatinine Clearance: 26 mL/min (A) (by C-G formula based on SCr of 1.65 mg/dL (H)).  Liver Function Tests: Recent Labs  Lab 02/27/22 1438 02/28/22 0113 03/03/22 0816  AST 34  --  43*  ALT 22  --  21  ALKPHOS 78  --  41  BILITOT 0.6  --  0.6  PROT 7.2  --  4.7*  ALBUMIN 3.5 2.9* 2.2*    Recent Labs  Lab 02/27/22 2156  LIPASE 67*    No results for input(s): "AMMONIA" in the last 168 hours.  Coagulation Profile: Recent Labs  Lab 02/27/22 1438  INR 1.1     Cardiac Enzymes: Recent Labs  Lab 02/27/22 2156  CKTOTAL 1,062*     BNP (last 3 results) No results for input(s):  "PROBNP" in the last 8760 hours.  Lipid Profile: No results for input(s): "CHOL", "HDL", "LDLCALC", "TRIG", "CHOLHDL", "LDLDIRECT" in the last 72 hours.  Thyroid Function Tests: Recent Labs    03/03/22 0816  TSH 0.429    Anemia Panel: No results for input(s): "VITAMINB12", "FOLATE", "FERRITIN", "TIBC", "IRON", "RETICCTPCT" in the last 72 hours.  Urine analysis:    Component Value Date/Time   COLORURINE COLORLESS (A) 10/24/2016 1858   APPEARANCEUR CLEAR 10/24/2016 1858   LABSPEC 1.006 10/24/2016 1858   PHURINE 5.0 10/24/2016 1858   GLUCOSEU NEGATIVE 10/24/2016 1858   HGBUR NEGATIVE 10/24/2016 1858   BILIRUBINUR NEGATIVE 10/24/2016 1858   KETONESUR NEGATIVE 10/24/2016 1858   PROTEINUR NEGATIVE 10/24/2016 1858   UROBILINOGEN 0.2 02/07/2013 1859   NITRITE NEGATIVE 10/24/2016 1858   LEUKOCYTESUR NEGATIVE 10/24/2016 1858    Sepsis Labs: Lactic Acid, Venous    Component Value Date/Time   LATICACIDVEN 1.7 02/27/2022 2156    MICROBIOLOGY: Recent Results (from the past 240 hour(s))  Resp Panel by RT-PCR (Flu A&B, Covid) Anterior Nasal Swab     Status: None   Collection Time: 02/27/22  4:59 PM   Specimen: Anterior Nasal Swab  Result Value Ref Range Status   SARS Coronavirus 2 by RT PCR NEGATIVE NEGATIVE Final    Comment: (NOTE) SARS-CoV-2 target nucleic acids are NOT DETECTED.  The SARS-CoV-2 RNA is generally detectable in upper respiratory specimens during the acute phase of infection. The lowest concentration of SARS-CoV-2 viral copies this assay can detect is 138 copies/mL. A negative result does not preclude SARS-Cov-2 infection and should not be used as the sole basis for treatment or other patient management decisions. A negative result may occur with  improper specimen collection/handling, submission of specimen other than nasopharyngeal swab, presence of viral mutation(s) within the areas targeted by this assay, and inadequate number of viral copies(<138  copies/mL). A negative result must be combined with clinical observations, patient history, and epidemiological information. The expected result is Negative.  Fact Sheet for Patients:  EntrepreneurPulse.com.au  Fact Sheet for Healthcare Providers:  IncredibleEmployment.be  This  test is no t yet approved or cleared by the [place] and  has been authorized for detection and/or diagnosis of SARS-CoV-2 by FDA under an Emergency Use Authorization (EUA). This EUA will remain  in effect (meaning this test can be used) for the duration of the COVID-19 declaration under Section 564(b)(1) of the Act, 21 U.S.C.section 360bbb-3(b)(1), unless the authorization is terminated  or revoked sooner.       Influenza A by PCR NEGATIVE NEGATIVE Final   Influenza B by PCR NEGATIVE NEGATIVE Final    Comment: (NOTE) The Xpert Xpress SARS-CoV-2/FLU/RSV plus assay is intended as an aid in the diagnosis of influenza from Nasopharyngeal swab specimens and should not be used as a sole basis for treatment. Nasal washings and aspirates are unacceptable for Xpert Xpress SARS-CoV-2/FLU/RSV testing.  Fact Sheet for Patients: EntrepreneurPulse.com.au  Fact Sheet for Healthcare Providers: IncredibleEmployment.be  This test is not yet approved or cleared by the [place] FDA and has been authorized for detection and/or diagnosis of SARS-CoV-2 by FDA under an Emergency Use Authorization (EUA). This EUA will remain in effect (meaning this test can be used) for the duration of the COVID-19 declaration under Section 564(b)(1) of the Act, 21 U.S.C. section 360bbb-3(b)(1), unless the authorization is terminated or revoked.  Performed at [facility], [place] [address] [place], [place] [address]   MRSA Next Gen by PCR, Nasal     Status: None   Collection Time: 02/27/22  9:30 PM   Specimen: Nasal Mucosa; Nasal Swab  Result  Value Ref Range Status   MRSA by PCR Next Gen NOT DETECTED NOT DETECTED Final    Comment: (NOTE) The GeneXpert MRSA Assay (FDA approved for NASAL specimens only), is one component of a comprehensive MRSA colonization surveillance program. It is not intended to diagnose MRSA infection nor to guide or monitor treatment for MRSA infections. Test performance is not FDA approved in patients less than 68 years old. Performed at [facility], [place] [address] [place], [place] [address]     RADIOLOGY STUDIES/RESULTS: No results found.   LOS: 4 days   Oren Binet, MD  [facility]    To contact the attending provider between 7A-7P or the covering provider during after hours 7P-7A, please log into the web site [email or web address] and access using universal [place] password for that web site. If you do not have the password, please call the hospital operator.  03/03/2022, 11:11 AM

## 2022-03-03 NOTE — Progress Notes (Signed)
ANTICOAGULATION CONSULT NOTE - Follow Up Consult  Pharmacy Consult for Heparin Indication: atrial fibrillation  Allergies  Allergen Reactions   Aspirin Other (See Comments)    Reports GI bleed--pt is currently taking   Lisinopril Cough   Sudafed [Pseudoephedrine Hcl]     Pt reports she had drainage in throat that made her throat hurt.     Patient Measurements: Height: '4\' 9"'$  (144.8 cm) Weight: 84.2 kg (185 lb 10 oz) IBW/kg (Calculated) : 38.6  Vital Signs: Temp: 98.5 F (36.9 C) (06/12 2008) Temp Source: Oral (06/12 2008) BP: 119/59 (06/12 2000) Pulse Rate: 63 (06/12 2000)  Labs: Recent Labs    03/01/22 0550 03/02/22 0246 03/03/22 0816 03/03/22 1007 03/03/22 1550 03/03/22 1854  HGB 7.8* 7.7* 7.9*  --   --   --   HCT 22.4* 22.9* 23.3*  --   --   --   PLT 204 201 176  --   --   --   HEPARINUNFRC  --   --   --   --  >1.10* >1.10*  CREATININE 2.92* 2.40* 1.65*  --   --   --   TROPONINIHS  --   --  26* 21*  --   --      Estimated Creatinine Clearance: 26 mL/min (A) (by C-G formula based on SCr of 1.65 mg/dL (H)).   Assessment: 76 year old female to begin heparin for Afib, with AKI and meningioma.  Heparin level came back elevated x2 even after repeat. No NOAC prior to admission when Rn asked the pt. We will hold x1 hr then decrease rate. She was therapeutic here 5 yrs ago on heparin at 1000 units/hr.   Goal of Therapy:  Heparin level 0.3-0.7 units/ml Monitor platelets by anticoagulation protocol: Yes   Plan:  Hold heparin x1 hr Decrease heparin drip to 1000units / hr Heparin level in AM Daily heparin level, CBC  Onnie Boer, PharmD, BCIDP, AAHIVP, CPP Infectious Disease Pharmacist 03/03/2022 8:17 PM

## 2022-03-04 ENCOUNTER — Inpatient Hospital Stay (HOSPITAL_COMMUNITY): Payer: Medicare Other

## 2022-03-04 DIAGNOSIS — I4891 Unspecified atrial fibrillation: Secondary | ICD-10-CM | POA: Diagnosis not present

## 2022-03-04 DIAGNOSIS — I5022 Chronic systolic (congestive) heart failure: Secondary | ICD-10-CM | POA: Diagnosis not present

## 2022-03-04 DIAGNOSIS — N1832 Chronic kidney disease, stage 3b: Secondary | ICD-10-CM | POA: Diagnosis not present

## 2022-03-04 DIAGNOSIS — N179 Acute kidney failure, unspecified: Secondary | ICD-10-CM | POA: Diagnosis not present

## 2022-03-04 LAB — HEPARIN LEVEL (UNFRACTIONATED)
Heparin Unfractionated: >1.1 IU/mL — ABNORMAL HIGH (ref 0.30–0.70)
Heparin Unfractionated: >1.1 IU/mL — ABNORMAL HIGH (ref 0.30–0.70)
Heparin Unfractionated: 0.16 IU/mL — ABNORMAL LOW (ref 0.30–0.70)

## 2022-03-04 LAB — GLUCOSE, CAPILLARY
Glucose-Capillary: 108 mg/dL — ABNORMAL HIGH (ref 70–99)
Glucose-Capillary: 117 mg/dL — ABNORMAL HIGH (ref 70–99)
Glucose-Capillary: 123 mg/dL — ABNORMAL HIGH (ref 70–99)
Glucose-Capillary: 128 mg/dL — ABNORMAL HIGH (ref 70–99)
Glucose-Capillary: 144 mg/dL — ABNORMAL HIGH (ref 70–99)
Glucose-Capillary: 150 mg/dL — ABNORMAL HIGH (ref 70–99)
Glucose-Capillary: 157 mg/dL — ABNORMAL HIGH (ref 70–99)

## 2022-03-04 LAB — CBC
HCT: 27.3 % — ABNORMAL LOW (ref 36.0–46.0)
Hemoglobin: 9 g/dL — ABNORMAL LOW (ref 12.0–15.0)
MCH: 28.2 pg (ref 26.0–34.0)
MCHC: 33 g/dL (ref 30.0–36.0)
MCV: 85.6 fL (ref 80.0–100.0)
Platelets: 204 10*3/uL (ref 150–400)
RBC: 3.19 MIL/uL — ABNORMAL LOW (ref 3.87–5.11)
RDW: 17 % — ABNORMAL HIGH (ref 11.5–15.5)
WBC: 34 10*3/uL — ABNORMAL HIGH (ref 4.0–10.5)
nRBC: 0.1 % (ref 0.0–0.2)

## 2022-03-04 LAB — COMPREHENSIVE METABOLIC PANEL
ALT: 28 U/L (ref 0–44)
AST: 43 U/L — ABNORMAL HIGH (ref 15–41)
Albumin: 2.6 g/dL — ABNORMAL LOW (ref 3.5–5.0)
Alkaline Phosphatase: 46 U/L (ref 38–126)
Anion gap: 9 (ref 5–15)
BUN: 35 mg/dL — ABNORMAL HIGH (ref 8–23)
CO2: 25 mmol/L (ref 22–32)
Calcium: 8.5 mg/dL — ABNORMAL LOW (ref 8.9–10.3)
Chloride: 101 mmol/L (ref 98–111)
Creatinine, Ser: 1.65 mg/dL — ABNORMAL HIGH (ref 0.44–1.00)
GFR, Estimated: 32 mL/min — ABNORMAL LOW (ref >60)
Glucose, Bld: 118 mg/dL — ABNORMAL HIGH (ref 70–99)
Potassium: 4.3 mmol/L (ref 3.5–5.1)
Sodium: 135 mmol/L (ref 135–145)
Total Bilirubin: 0.8 mg/dL (ref 0.3–1.2)
Total Protein: 5.7 g/dL — ABNORMAL LOW (ref 6.5–8.1)

## 2022-03-04 LAB — MAGNESIUM: Magnesium: 2.3 mg/dL (ref 1.7–2.4)

## 2022-03-04 LAB — URINALYSIS, ROUTINE W REFLEX MICROSCOPIC
Bilirubin Urine: NEGATIVE
Glucose, UA: >=500 mg/dL — AB
Ketones, ur: NEGATIVE mg/dL
Leukocytes,Ua: NEGATIVE
Nitrite: NEGATIVE
Protein, ur: 30 mg/dL — AB
Specific Gravity, Urine: 1.021 (ref 1.005–1.030)
pH: 5 (ref 5.0–8.0)

## 2022-03-04 LAB — APTT
aPTT: 127 seconds — ABNORMAL HIGH (ref 24–36)
aPTT: 34 seconds (ref 24–36)

## 2022-03-04 MED ORDER — HEPARIN (PORCINE) 25000 UT/250ML-% IV SOLN
850.0000 [IU]/h | INTRAVENOUS | Status: DC
Start: 2022-03-04 — End: 2022-03-04
  Filled 2022-03-04: qty 250

## 2022-03-04 MED ORDER — AMIODARONE HCL 200 MG PO TABS
200.0000 mg | ORAL_TABLET | Freq: Every day | ORAL | Status: DC
Start: 2022-03-11 — End: 2022-03-09

## 2022-03-04 MED ORDER — APIXABAN 2.5 MG PO TABS
2.5000 mg | ORAL_TABLET | Freq: Two times a day (BID) | ORAL | Status: DC
  Administered 2022-03-04 – 2022-03-13 (×18): 2.5 mg via ORAL
  Filled 2022-03-04 (×18): qty 1

## 2022-03-04 MED ORDER — SODIUM CHLORIDE 0.9 % IV SOLN
2.0000 g | INTRAVENOUS | Status: DC
  Administered 2022-03-04 – 2022-03-06 (×3): 2 g via INTRAVENOUS
  Filled 2022-03-04 (×3): qty 12.5

## 2022-03-04 MED ORDER — AMIODARONE HCL 200 MG PO TABS
400.0000 mg | ORAL_TABLET | Freq: Two times a day (BID) | ORAL | Status: DC
  Administered 2022-03-04 – 2022-03-07 (×8): 400 mg via ORAL
  Filled 2022-03-04 (×9): qty 2

## 2022-03-04 NOTE — Progress Notes (Signed)
Progress Note  Patient Name: Tara [name] Date of Encounter: 03/04/2022  Primary Cardiologist: None   Subjective   Overnight Patient has converted to sinus bradycardia. Lactate WNL 03/05/22. Patient notes that she feel better back in rhythm No CP, SOB, Palpitations.  Inpatient Medications    Scheduled Meds:  aspirin  81 mg Oral Daily   Chlorhexidine Gluconate Cloth  6 each Topical Q0600   clonazePAM  0.5 mg Oral QHS   dexamethasone (DECADRON) injection  4 mg Intravenous Q6H   insulin aspart  0-9 Units Subcutaneous Q4H   insulin glargine-yfgn  10 Units Subcutaneous QHS   lidocaine  1 patch Transdermal Q24H   montelukast  10 mg Oral QHS   pantoprazole  40 mg Oral Q1200   polyethylene glycol  17 g Oral BID   rosuvastatin  10 mg Oral Daily   senna  2 tablet Oral Daily   sertraline  25 mg Oral Daily   Continuous Infusions:  sodium chloride Stopped (02/28/22 1547)   amiodarone 30 mg/hr (03/04/22 0600)   heparin     lactated ringers     lactated ringers     lactated ringers Stopped (03/03/22 1154)   PRN Meds: sodium chloride, acetaminophen, bisacodyl, docusate sodium, melatonin, ondansetron (ZOFRAN) IV   Vital Signs    Vitals:   03/04/22 0615 03/04/22 0630 03/04/22 0645 03/04/22 0731  BP:  124/65    Pulse: (!) 56 (!) 57 (!) 59   Resp: (!) '21 17 13   '$ Temp:    97.8 F (36.6 C)  TempSrc:    Oral  SpO2: 96% 97% 97%   Weight:      Height:        Intake/Output Summary (Last 24 hours) at 03/04/2022 3810 Last data filed at 03/04/2022 0600 Gross per 24 hour  Intake 3530.43 ml  Output 950 ml  Net 2580.43 ml   Filed Weights   03/02/22 0500 03/03/22 0338 03/04/22 0500  Weight: 83.4 kg 84.2 kg 84.5 kg    Telemetry    AF RVR to SR at ~ 1300 - Personally Reviewed  ECG    SR - Personally Reviewed  Physical Exam   Gen: no distress, morbid obesity   Neck: No JVD Cardiac: No Rubs or Gallops, systolic murmur, RRR +2 radial pulses Respiratory: Clear to  auscultation bilaterally, normal effort, normal  respiratory rate GI: Soft, nontender, non-distended  MS: No  edema;  moves all extremities Integument: Skin feels warm Neuro:  At time of evaluation, alert and oriented to person/place/time/situation  Psych: Normal affect, patient feels well   Labs    Chemistry Recent Labs  Lab 02/27/22 1438 02/27/22 1512 02/28/22 0113 02/28/22 1203 03/02/22 0246 03/03/22 0816 03/04/22 0605  NA 143   < > 144   < > 139 138 135  K 3.7   < > 3.1*   < > 4.2 4.2 4.3  CL 116*   < > 113*   < > 102 107 101  CO2 <7*   < > 10*   < > '26 22 25  '$ GLUCOSE 120*   < > 99   < > 136* 106* 118*  BUN 97*   < > 88*   < > 49* 36* 35*  CREATININE 6.34*   < > 5.11*   < > 2.40* 1.65* 1.65*  CALCIUM 9.0   < > 8.8*   < > 8.0* 7.8* 8.5*  PROT 7.2  --   --   --   --  4.7* 5.7*  ALBUMIN 3.5  --  2.9*  --   --  2.2* 2.6*  AST 34  --   --   --   --  43* 43*  ALT 22  --   --   --   --  21 28  ALKPHOS 78  --   --   --   --  41 46  BILITOT 0.6  --   --   --   --  0.6 0.8  GFRNONAA 6*   < > 8*   < > 20* 32* 32*  ANIONGAP NOT CALCULATED   < > 21*   < > '11 9 9   '$ < > = values in this interval not displayed.     Hematology Recent Labs  Lab 03/02/22 0246 03/03/22 0816 03/04/22 0605  WBC 22.5* 23.6* 34.0*  RBC 2.82* 2.77* 3.19*  HGB 7.7* 7.9* 9.0*  HCT 22.9* 23.3* 27.3*  MCV 81.2 84.1 85.6  MCH 27.3 28.5 28.2  MCHC 33.6 33.9 33.0  RDW 16.8* 17.2* 17.0*  PLT 201 176 204    Cardiac EnzymesNo results for input(s): "TROPONINI" in the last 168 hours. No results for input(s): "TROPIPOC" in the last 168 hours.   BNP Recent Labs  Lab 02/27/22 1659  BNP 157.6*     DDimer No results for input(s): "DDIMER" in the last 168 hours.   Radiology    No results found.  Patient Profile     76 y.o. female AF RVR and hypotension  Assessment & Plan    Atrial fibrillation with RVR HTN and HLD Breast cancer with prior chemo-radiation Hypotension - suspect related to  diltiazem  HFrEF - in the setting of meningioma and slurred speech (resolved) - found down with AKI that is improving (pre-renal suspected) - CHADVASC 7, on heparin with no issues; will stop ASA and transition to DOAC today - presently sinus rhythm, will transition to PO amiodarone load - given her intolerance to AF RVR (vs intolerance to CCB)  - rosuvastatin 10 mg  - given her AKI and hypotension we will slowly work to get her back on GDMT; some of this may be done as outpatient   For questions or updates, please contact [facility] Please consult [email or web address] for contact info under Cardiology/STEMI.      Rudean Haskell, MD Cardiologist [facility] [place], [address] [place], [place] [address] [phone]  8:21 AM

## 2022-03-04 NOTE — Progress Notes (Signed)
NAME:  Tara [name], MRN:  [id number], DOB:  08-23-1946, LOS: 5 ADMISSION DATE:  02/27/2022 CONSULTATION DATE:  02/27/2022 REFERRING MD:  [place] - EDP CHIEF COMPLAINT:  Acute renal failure   History of Present Illness:  76 year old woman who presented to [facility] ED 6/8 with weakness, confusion and progressive worsening of slurred speech. Reportedly fell out of bed overnight and was unable to get back into bed unassisted. Found on the floor 6/8AM, alert. Family reports fatigue and progressive confusion/slurred speech for the last 1-2 weeks. 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   On ED arrival, patient was noted to have slurred speech without HA, unilateral strength deficit or facial droop. Reported vomiting over the last 1-2 days, more episodes today. Denies fever/chills, CP/SOB, cough/rhinorrhea, diarrhea, dysuria, HA. Endorses generalized weakness and constipation as well as urinary urgency. Afebrile on ED arrival with HR 65, BP soft 91/51 (MAP 64). Labs notable for WBC 20.6 (85% N), Na 143, K 3.7, bicarb < 7, Cr 6.34 (baseline 1.5-1.7), BUN 97; LFTs WNL. Trop mildly elevated at 26, BNP 157. LA 1.2. VBG with pH 7.06, bicarb 9.2. CXR demonstrating old granulomatous disease (remote TB), Renal US without hydronephrosis. CT C-spine negative. CT Head showed interval increase in known planum sphenoidale meningioma with sequelae of ?edema r/t mass. NSGY was consulted and dexamethasone initiated. Unable to obtain contrasted MRI at present due to ARF. Nephrology consulted for ARF with aim to manage without RRT.  PCCM consulted for ICU admission in the setting of acidosis and ARF.  Tx out of ICU 6/10.  6/12 had episode of AFRVR for which was started on Cardizem. Unfortunately developed hypotension. Cardizem stopped. Fluid bolus and  amiodarone bolus started. Cardiology consulted and requested transfer to ICU.   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   Anxiety    Breast cancer ([place]) 2008   right - s/p lumpectomy->chemo, radiation   Depression    Dysrhythmia    hx SVT   Family history of colon cancer    Family history of prostate cancer    GERD (gastroesophageal reflux disease)    H/O: hysterectomy    Hiatal hernia    History of cancer chemotherapy    History of radiation therapy    Hyperlipidemia    Hypertension 01/25/2018   Nonischemic cardiomyopathy ([place])    Personal history of radiation therapy 01/05/209   SVT (supraventricular tachycardia) (HCC)    short RP SVT documented 7/37   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[place])    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)   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 Progressive weakness, fatigue, slurred speech over 1-2 weeks. Vomiting x 1-2 days. CT Head with increased size of known meningioma with ?sequelae of edema r/t mass. NSGY consulted. ARF suspected to be prerenal, Nephro consulted. PCCM consulted for admission. 6/10 transfer out of ICU 6/12 AFRVR with HoTN after Cardizem. Started on IVF and Amio, cards consulted and requesting tx to ICU with possible  cardioversion  Interim History / Subjective:  Back in sinus rhythm. Feels better today. Cards converting amio to PO.  Objective:  Blood pressure 124/65, pulse (!) 59, temperature 97.8 F (36.6 C), temperature source Oral, resp. rate 13, height '4\' 9"'$  (1.448 m), weight 84.5 kg, SpO2 97 %.        Intake/Output Summary (Last 24 hours) at 03/04/2022 0917 Last data filed  at 03/04/2022 0900 Gross per 24 hour  Intake 3600.75 ml  Output 950 ml  Net 2650.75 ml    Filed Weights   03/02/22 0500 03/03/22 0338 03/04/22 0500  Weight: 83.4 kg 84.2 kg 84.5 kg    Physical Examination: General: Adult female, resting in bed, in NAD. Neuro: A&O x 3, no deficits. HEENT: [place]/AT. Sclerae anicteric. EOMI. Cardiovascular: IRIR, no M/R/G.  Lungs: Respirations even and unlabored.  CTA bilaterally, No W/R/R. Abdomen: BS x 4, soft, NT/ND.  Musculoskeletal: No gross deformities, no edema.  Skin: Intact, warm, no rashes.   Assessment & Plan:   New onset AFRVR with HoTN after Cardizem - both BP and HR improved after amio and d/c Cardizem Hx chronic HFpEF, HTN, HLD. - Appreciate cards assistance. - Continue Amiodarone PO per cards. - Cards transitioning heparin gtt to DOAC. - Continue gentle fluids. - Hold Entresto, Aldactone and Jardiance, Toprol-XL, statin in the setting of ARF.  AKI on CKD stage IIIb in the setting of medication including Entresto/Aldactone/Jardiance and ibuprofen, exacerbated by severe dehydration which is now improved. Renal US negative for hydro and UOP now improved. - Continue supportive care. - Avoid NSAIDs and other nephrotoxic agents. - Monitor intake and output. - Follow BMP.  Leukocytosis - unclear etiology but no signs of infection and afebrile. Likely reactive though continues to climb. - Closely monitor clinically. - Trend fever curve / CBC.  Meningioma of planum sphenoidale - CT Head 6/8 showing interval increase of previously noted planum sphenoidale meningioma, interval development of hypodensity in the inferior left frontal lobe, favored to represent sequela of remote infarct but possibly also representing edema related to the mass. - Neurosurgery recommending MRI with and without contrast once kidney function normalized. - Continue Decadron '4mg'$  Q6H. - She will follow neurosurgery as an outpatient.  GERD. - Continue PPI.  Hx  Prediabetes (A1c 6.3%.). - SSI. - Holding Jardiance in the setting of ARF.  Hx Depression/anxiety. - Hold home Zoloft, Klonipin for now.]  Ok to transfer back out of ICU to cardiac tele. Will ask TRH to resume care again in Am 6/14 with PCCM off.  Best Practice: (right click and "Reselect all SmartList Selections" daily)   Diet/type: Regular consistency DVT prophylaxis: DOAC GI prophylaxis: PPI Lines: N/A Foley:  N/A Code Status:  full code Last date of multidisciplinary goals of care discussion: Discussed with and upated daughter and husband at bedside as well as Dr. Gasper Sells of Cardiology 6/12.   Montey Hora, [facility] For pager details, please see AMION or use Epic chat  After 1900, please call [facility] for cross coverage needs 03/04/2022, 9:17 AM

## 2022-03-04 NOTE — Progress Notes (Signed)
[facility] for Heparin >> Eliquis Indication: atrial fibrillation  Allergies  Allergen Reactions   Aspirin Other (See Comments)    Reports GI bleed--pt is currently taking   Lisinopril Cough   Sudafed [Pseudoephedrine Hcl]     Pt reports she had drainage in throat that made her throat hurt.     Patient Measurements: Height: '4\' 9"'$  (144.8 cm) Weight: 84.5 kg (186 lb 4.6 oz) IBW/kg (Calculated) : 38.6  Vital Signs: Temp: 98.3 F (36.8 C) (06/13 1956) Temp Source: Oral (06/13 1956) BP: 96/69 (06/13 1600) Pulse Rate: 62 (06/13 1600)  Labs: Recent Labs    03/02/22 0246 03/03/22 0816 03/03/22 1007 03/03/22 1550 03/04/22 0605 03/04/22 1016 03/04/22 1137 03/04/22 1950  HGB 7.7* 7.9*  --   --  9.0*  --   --   --   HCT 22.9* 23.3*  --   --  27.3*  --   --   --   PLT 201 176  --   --  204  --   --   --   APTT  --   --   --   --   --   --  127* 34  HEPARINUNFRC  --   --   --    < > >1.10* >1.10*  --  0.16*  CREATININE 2.40* 1.65*  --   --  1.65*  --   --   --   TROPONINIHS  --  26* 21*  --   --   --   --   --    < > = values in this interval not displayed.     Estimated Creatinine Clearance: 26.1 mL/min (A) (by C-G formula based on SCr of 1.65 mg/dL (H)).   Assessment: 76 year old female with Afib and was started on IV heparin.  Patient also with AKI that has improved and meningioma.  Heparin level has been elevated at >1.1 units/mL despite holding heparin and decreasing rate.  Labs were drawn appropriately.  Heparin held earlier today and labs have normalized this evening, indicating that heparin has been cleared.  Pharmacy consulted to transition patient to Eliquis.    Patient qualifies for full Eliquis dosing given age < 16 and weight > 60 kg; however, given her accumulation to IV heparin we are also concerned she will accumulate with Eliquis.  After weighing the risk and benefit, the medical team is in agreement to start Eliquis at a  reduced dose and monitor.  Goal of Therapy:  Appropriate anticoagulation Monitor platelets by anticoagulation protocol: Yes   Plan:  Eliquis 2.'5mg'$  PO BID Pharmacy will sign off and monitor peripherally.  Thank you for the consult!  Taunya Goral D. Mina Marble, PharmD, BCPS, [place] 03/04/2022, 8:55 PM

## 2022-03-04 NOTE — Progress Notes (Signed)
Physical Therapy Treatment Patient Details Name: Tara [name] MRN: [id number] DOB: 03-Oct-1945 Today's Date: 03/04/2022   History of Present Illness pt is a 76 y/o female admitted through ED 6/8 with weakness, confusion and progressive worsening of slurred speech.  CT head showing increased size of known meningioma,   ARF found on work up.   PMHx: HTN, CHF, NiCM, Breast CA with chemo/fdx    PT Comments    Pt has had minor set back from episode of A-fib and drop in BP from Cardizem. Pt BP is stable with movement and she is able to come to seated EoB with modA and transfer to recliner with minA. PT continues to recommend SNF level rehab, however pt will likely progress mobility to level to go home with assistance as she is very motivated to do so.     Recommendations for follow up therapy are one component of a multi-disciplinary discharge planning process, led by the attending physician.  Recommendations may be updated based on patient status, additional functional criteria and insurance authorization.  Follow Up Recommendations  Skilled nursing-short term rehab (<3 hours/day) (Even if SNF beneficial, pt will refuse per dtr.)     Assistance Recommended at Discharge Intermittent Supervision/Assistance (husband/family will need to add assist if pt goes directly home.)  Patient can return home with the following A little help with walking and/or transfers;A little help with bathing/dressing/bathroom;Assistance with cooking/housework;Assist for transportation;Help with stairs or ramp for entrance;Direct supervision/assist for medications management;Direct supervision/assist for financial management   Equipment Recommendations  Rolling walker (2 wheels) (Youth RW)       Precautions / Restrictions Precautions Precautions: Fall Restrictions Weight Bearing Restrictions: No     Mobility  Bed Mobility Overal bed mobility: Needs Assistance Bed Mobility: Supine to Sit     Supine to sit: Mod  assist     General bed mobility comments: min A for management of LE across bed, light mod truncal assist up and moderate assist to scoot to EOB    Transfers Overall transfer level: Needs assistance Equipment used: Rolling walker (2 wheels), 1 person hand held assist Transfers: Sit to/from Stand, Bed to chair/wheelchair/BSC Sit to Stand: Min assist   Step pivot transfers: Min assist       General transfer comment: cues for hand placement for power up to RW, minA for power up and steadying  face to face assist to come forward with minor boost., maximal cuing for sequencing stepping to recliner, Pt very small and only RW available Bariatric so midway through transitioned to [facility].    Ambulation/Gait               General Gait Details: pt declined due to fatigue         Balance Overall balance assessment: Needs assistance Sitting-balance support: No upper extremity supported, Feet supported Sitting balance-Leahy Scale: Fair     Standing balance support: Single extremity supported, Bilateral upper extremity supported, During functional activity Standing balance-Leahy Scale: Poor Standing balance comment: generally unsteady needing external support.                            Cognition Arousal/Alertness: Lethargic, Awake/alert Behavior During Therapy: Flat affect Overall Cognitive Status: Within Functional Limits for tasks assessed  General Comments General comments (skin integrity, edema, etc.): BP supine 119/70, sittng 131/78, standing 132/76 and sitting in recliner 119/59      Pertinent Vitals/Pain Pain Assessment Pain Assessment: No/denies pain     PT Goals (current goals can now be found in the care plan section) Acute Rehab PT Goals Patient Stated Goal: get home. PT Goal Formulation: With patient Time For Goal Achievement: 03/15/22 Potential to Achieve Goals: Good     Frequency    Min 3X/week      PT Plan Current plan remains appropriate       AM-PAC PT "6 Clicks" Mobility   Outcome Measure  Help needed turning from your back to your side while in a flat bed without using bedrails?: A Lot Help needed moving from lying on your back to sitting on the side of a flat bed without using bedrails?: A Lot Help needed moving to and from a bed to a chair (including a wheelchair)?: A Lot Help needed standing up from a chair using your arms (e.g., wheelchair or bedside chair)?: A Little Help needed to walk in hospital room?: A Lot Help needed climbing 3-5 steps with a railing? : A Lot 6 Click Score: 13    End of Session Equipment Utilized During Treatment: Gait belt Activity Tolerance: Patient limited by fatigue;Patient tolerated treatment well Patient left: in chair;with call bell/phone within reach;with chair alarm set Nurse Communication: Mobility status PT Visit Diagnosis: Unsteadiness on feet (R26.81);Muscle weakness (generalized) (M62.81)     Time: 3846-6599 PT Time Calculation (min) (ACUTE ONLY): 30 min  Charges:  $Therapeutic Activity: 23-37 mins                     Dannell Gortney B. Migdalia Dk PT, DPT Acute Rehabilitation Services Please use secure chat or  Call Office [phone]    [place] 03/04/2022, 4:09 PM

## 2022-03-04 NOTE — Progress Notes (Signed)
ANTICOAGULATION CONSULT NOTE - Follow Up Consult  Pharmacy Consult for Heparin Indication: atrial fibrillation  Allergies  Allergen Reactions   Aspirin Other (See Comments)    Reports GI bleed--pt is currently taking   Lisinopril Cough   Sudafed [Pseudoephedrine Hcl]     Pt reports she had drainage in throat that made her throat hurt.     Patient Measurements: Height: '4\' 9"'$  (144.8 cm) Weight: 84.5 kg (186 lb 4.6 oz) IBW/kg (Calculated) : 38.6  Vital Signs: Temp: 97.8 F (36.6 C) (06/13 0731) Temp Source: Oral (06/13 0731) BP: 124/65 (06/13 0630) Pulse Rate: 59 (06/13 0645)  Labs: Recent Labs    03/02/22 0246 03/03/22 0816 03/03/22 1007 03/03/22 1550 03/03/22 1854 03/04/22 0605  HGB 7.7* 7.9*  --   --   --  9.0*  HCT 22.9* 23.3*  --   --   --  27.3*  PLT 201 176  --   --   --  204  HEPARINUNFRC  --   --   --  >1.10* >1.10* >1.10*  CREATININE 2.40* 1.65*  --   --   --  1.65*  TROPONINIHS  --  26* 21*  --   --   --     Estimated Creatinine Clearance: 26.1 mL/min (A) (by C-G formula based on SCr of 1.65 mg/dL (H)).   Assessment: 76 year old female to begin heparin for Afib, with AKI and meningioma. No NOAC prior to admission when Rn asked the patient.  Heparin level again >1.1 despite holding heparin and decreasing rate. Heparin level drawn from same arm that heparin was infusing but below infusion site - this may be impacting level. Unfortunately patient is no stick on other arm. CBC stable. No bleeding noted. AKI noted which is improving back to baseline (baseline appears to be ~1.5). We will hold x1 hr then decrease rate.   Goal of Therapy:  Heparin level 0.3-0.7 units/ml Monitor platelets by anticoagulation protocol: Yes   Plan:  Hold heparin x1 hr Decrease heparin drip to 850 units / hr Heparin level in 8 hours.  Daily heparin level, CBC  Follow-up ability to change to oral therapy  Sloan Leiter, PharmD, BCPS, BCCCP Clinical Pharmacist Please refer  to [facility] for [place] numbers 03/04/2022 7:36 AM

## 2022-03-04 NOTE — Plan of Care (Signed)

## 2022-03-04 NOTE — Progress Notes (Signed)
Due to the patient now unfortunately requiring anticoagulation due to A-fib, would recommend ultimately she follow-up as an outpatient in approximately 1 month for repeat MRI of the brain with Dr. Christella Noa for evaluation of her meningioma.  Please do not hesitate to reach out for any questions or concerns.   Thank you for allowing me to participate in this patient's care.  Please do not hesitate to call with questions or concerns.   Elwin Sleight, [facility] Cell: [phone]

## 2022-03-05 ENCOUNTER — Encounter (HOSPITAL_COMMUNITY): Payer: Self-pay | Admitting: Critical Care Medicine

## 2022-03-05 DIAGNOSIS — I5022 Chronic systolic (congestive) heart failure: Secondary | ICD-10-CM | POA: Diagnosis not present

## 2022-03-05 DIAGNOSIS — D429 Neoplasm of uncertain behavior of meninges, unspecified: Secondary | ICD-10-CM | POA: Diagnosis not present

## 2022-03-05 DIAGNOSIS — F419 Anxiety disorder, unspecified: Secondary | ICD-10-CM | POA: Diagnosis not present

## 2022-03-05 DIAGNOSIS — I10 Essential (primary) hypertension: Secondary | ICD-10-CM | POA: Diagnosis not present

## 2022-03-05 DIAGNOSIS — N179 Acute kidney failure, unspecified: Secondary | ICD-10-CM | POA: Diagnosis not present

## 2022-03-05 DIAGNOSIS — E7800 Pure hypercholesterolemia, unspecified: Secondary | ICD-10-CM | POA: Diagnosis not present

## 2022-03-05 DIAGNOSIS — I4891 Unspecified atrial fibrillation: Secondary | ICD-10-CM | POA: Diagnosis not present

## 2022-03-05 LAB — CBC
HCT: 25.1 % — ABNORMAL LOW (ref 36.0–46.0)
Hemoglobin: 8.4 g/dL — ABNORMAL LOW (ref 12.0–15.0)
MCH: 28.2 pg (ref 26.0–34.0)
MCHC: 33.5 g/dL (ref 30.0–36.0)
MCV: 84.2 fL (ref 80.0–100.0)
Platelets: 201 10*3/uL (ref 150–400)
RBC: 2.98 MIL/uL — ABNORMAL LOW (ref 3.87–5.11)
RDW: 16.4 % — ABNORMAL HIGH (ref 11.5–15.5)
WBC: 35.4 10*3/uL — ABNORMAL HIGH (ref 4.0–10.5)
nRBC: 0.1 % (ref 0.0–0.2)

## 2022-03-05 LAB — BASIC METABOLIC PANEL
Anion gap: 9 (ref 5–15)
BUN: 38 mg/dL — ABNORMAL HIGH (ref 8–23)
CO2: 22 mmol/L (ref 22–32)
Calcium: 8.4 mg/dL — ABNORMAL LOW (ref 8.9–10.3)
Chloride: 101 mmol/L (ref 98–111)
Creatinine, Ser: 1.56 mg/dL — ABNORMAL HIGH (ref 0.44–1.00)
GFR, Estimated: 34 mL/min — ABNORMAL LOW (ref >60)
Glucose, Bld: 129 mg/dL — ABNORMAL HIGH (ref 70–99)
Potassium: 4.1 mmol/L (ref 3.5–5.1)
Sodium: 132 mmol/L — ABNORMAL LOW (ref 135–145)

## 2022-03-05 LAB — URINE CULTURE: Culture: <10000 — AB

## 2022-03-05 LAB — GLUCOSE, CAPILLARY
Glucose-Capillary: 129 mg/dL — ABNORMAL HIGH (ref 70–99)
Glucose-Capillary: 114 mg/dL — ABNORMAL HIGH (ref 70–99)
Glucose-Capillary: 145 mg/dL — ABNORMAL HIGH (ref 70–99)
Glucose-Capillary: 151 mg/dL — ABNORMAL HIGH (ref 70–99)
Glucose-Capillary: 141 mg/dL — ABNORMAL HIGH (ref 70–99)

## 2022-03-05 LAB — MAGNESIUM: Magnesium: 2 mg/dL (ref 1.7–2.4)

## 2022-03-05 LAB — PHOSPHORUS: Phosphorus: 3.6 mg/dL (ref 2.5–4.6)

## 2022-03-05 NOTE — Progress Notes (Signed)
Patient transferred to [address] , room 10, all belongings  taken by family.

## 2022-03-05 NOTE — TOC Progression Note (Signed)
Transition of Care [facility]) - Progression Note    Patient Details  Name: Tara [name] MRN: [id number] Date of Birth: Nov 02, 1945  [facility] of [facility] CM/SW Contact  Joanne Chars, LCSW Phone Number: 03/05/2022, 11:33 AM  Clinical Narrative:   CSW spoke with pt and husband Tara [name] regarding recommendation for SNF.  Pt declines SNF, states she wants to go home.  Pt lives with husband, who reports daughter is Therapist, sports and they do have help from extended family at home, would also consider hiring additional help if needed.      Expected Discharge Plan: [place] Barriers to Discharge: Continued Medical Work up  Expected Discharge Plan and Services Expected Discharge Plan: [place]   Discharge Planning Services: CM Consult   Living arrangements for the past 2 months: Single Family Home                                       Social Determinants of Health (SDOH) Interventions    Readmission Risk Interventions     No data to display

## 2022-03-05 NOTE — Discharge Instructions (Addendum)
Follow with Primary MD [name], MD in 7 days   Get CBC, CMP, 2 view Chest X ray -  checked next visit within 1 week by Primary MD    Activity: As tolerated with Full fall precautions use walker/cane & assistance as needed  Disposition Home    Diet: Heart Healthy low carbohydrate diet  Special Instructions: If you have smoked or chewed Tobacco  in the last 2 yrs please stop smoking, stop any regular Alcohol  and or any Recreational drug use.  On your next visit with your primary care physician please Get Medicines reviewed and adjusted.  Please request your Prim.MD to go over all Hospital Tests and Procedure/Radiological results at the follow up, please get all Hospital records sent to your Prim MD by signing hospital release before you go home.  If you experience worsening of your admission symptoms, develop shortness of breath, life threatening emergency, suicidal or homicidal thoughts you must seek medical attention immediately by calling 911 or calling your MD immediately  if symptoms less severe.  You Must read complete instructions/literature along with all the possible adverse reactions/side effects for all the Medicines you take and that have been prescribed to you. Take any new Medicines after you have completely understood and accpet all the possible adverse reactions/side effects.       Information on my medicine - ELIQUIS (apixaban)  Why was Eliquis prescribed for you? Eliquis was prescribed for you to reduce the risk of a blood clot forming that can cause a stroke if you have a medical condition called atrial fibrillation (a type of irregular heartbeat).  What do You need to know about Eliquis ? Take your Eliquis TWICE DAILY - one tablet in the morning and one tablet in the evening with or without food. If you have difficulty swallowing the tablet whole please discuss with your pharmacist how to take the medication safely.  Take Eliquis exactly as  prescribed by your doctor and DO NOT stop taking Eliquis without talking to the doctor who prescribed the medication.  Stopping may increase your risk of developing a stroke.  Refill your prescription before you run out.  After discharge, you should have regular check-up appointments with your healthcare provider that is prescribing your Eliquis.  In the future your dose may need to be changed if your kidney function or weight changes by a significant amount or as you get older.  What do you do if you miss a dose? If you miss a dose, take it as soon as you remember on the same day and resume taking twice daily.  Do not take more than one dose of ELIQUIS at the same time to make up a missed dose.  Important Safety Information A possible side effect of Eliquis is bleeding. You should call your healthcare provider right away if you experience any of the following: Bleeding from an injury or your nose that does not stop. Unusual colored urine (red or dark brown) or unusual colored stools (red or black). Unusual bruising for unknown reasons. A serious fall or if you hit your head (even if there is no bleeding).  Some medicines may interact with Eliquis and might increase your risk of bleeding or clotting while on Eliquis. To help avoid this, consult your healthcare provider or pharmacist prior to using any new prescription or non-prescription medications, including herbals, vitamins, non-steroidal anti-inflammatory drugs (NSAIDs) and supplements.  This website has more information on Eliquis (apixaban): http://www.eliquis.com/eliquis/home

## 2022-03-05 NOTE — Progress Notes (Signed)
PROGRESS NOTE        PATIENT DETAILS Name: Tara [name] Age: 76 y.o. Sex: female Date of Birth: [birth year] Admit Date: 02/27/2022 Admitting Physician Julian Hy, DO PZW:CHENIDP, Norwood Levo, MD  Brief Summary: Patient is a 76 y.o.  female with a history of HTN, HLD, CKD stage IIIb, chronic HFpEF, meningioma, breast CA-s/p right lumpectomy and chemoradiation in 2008, depression/anxiety who presented with weakness/fatigue/confusion/slurred speech-she was found to have AKI-and initially admitted to the ICU for possible RRT.  However with supportive care-renal function improved-she was subsequently [facility] on 6/11.  Significant events: 6/08>> presented to ED with weakness/fatigue/confusion/slurred sleep-AKI-meningioma.  Admitted by PCCM to ICU 6/11>> transfer to [facility] 6/12>> A-fib RVR with hypotension.  Amiodarone infusion started.  Transferred to ICU-subsequently converted to sinus rhythm. 6/14>> transfer to [facility].  Significant studies: 6/08>> CT head: Interval increase in size of previously noted planum sphenoidal meningioma. 6/08>> CT C-spine: No fracture or dislocation 6/08>> CXR: No cardiopulmonary disease. 6/08>> renal ultrasound: No hydronephrosis. 6/09>> Echo: EF 55% 6/12>> TSH: 0.429 6/13>> CXR: No PNA  Significant microbiology data: 6/08>> influenza/COVID PCR: Negative 6/13>> blood culture: Negative  Procedures: None  Consults: Nephrology, PCCM, cardiology  Subjective: Awake/alert-no major issues overnight-maintaining sinus rhythm.  Objective: Vitals: Blood pressure 136/67, pulse 64, temperature (!) 97.5 F (36.4 C), temperature source Oral, resp. rate (!) 24, height '4\' 9"'$  (1.448 m), weight 85.4 kg, SpO2 96 %.   Exam: Gen Exam:Alert awake-not in any distress HEENT:atraumatic, normocephalic Chest: B/L clear to auscultation anteriorly CVS:S1S2 regular Abdomen:soft non tender, non  distended Extremities:no edema Neurology: Non focal Skin: no rash   Pertinent Labs/Radiology:    Latest Ref Rng & Units 03/05/2022    1:00 AM 03/04/2022    6:05 AM 03/03/2022    8:16 AM  CBC  WBC 4.0 - 10.5 K/uL 35.4  34.0  23.6   Hemoglobin 12.0 - 15.0 g/dL 8.4  9.0  7.9   Hematocrit 36.0 - 46.0 % 25.1  27.3  23.3   Platelets 150 - 400 K/uL 201  204  176     Lab Results  Component Value Date   NA 132 (L) 03/05/2022   K 4.1 03/05/2022   CL 101 03/05/2022   CO2 22 03/05/2022      Assessment/Plan: AKI on CKD stage IIIb: AKI hemodynamically mediated in the setting of hypotension/Entresto/Aldactone/Jardiance and NSAID use.  Renal function has improved significantly and now close to baseline.  Follow electrolytes periodically.     A-fib with RVR hypotension: Started on amiodarone infusion-and converted to sinus rhythm.  Remains on IV heparin--cardiology following and on p.o. amiodarone load.  CHA2DS2-VASc score of around 6.  Meningioma possible vasogenic edema: Slurred speech has resolved-continue Decadron.  Per neurosurgery note-MRI brain now can be pursued in the outpatient setting-we will discuss with neurosurgery to see how when we can start tapering Decadron-and how long therapy is needed.    Normocytic anemia: Due to CKD-worsened due to AKI and IVF administration.  No indications for transfusion-watch closely.  Leukocytosis: Suspect due to steroids-UA/chest x-ray/blood cultures negative for infection.  On empiric cefepime.  See above regarding plans to get in touch with neurosurgery to see if he can start tapering steroids.  Chronic HFpEF: Volume status stable-GDMT meds/diuretics on hold.  HTN: BP stable-not on any antihypertensives currently.  HLD: On Crestor.  Prediabetes (A1c 6.3 on 6/8)-with steroid-induced hyperglycemia: CBG stable on SSI and 10 units of Semglee  Recent Labs    03/04/22 2331 03/05/22 0355 03/05/22 0744  GLUCAP 150* 129* 114*      Depression/anxiety: Resume Zoloft/Klonopin.  GERD: Continue PPI  Constipation: Had small BM with senna/MiraLAX-follow for now.  Morbid Obesity: Estimated body mass index is 40.74 kg/m as calculated from the following:   Height as of this encounter: '4\' 9"'$  (1.448 m).   Weight as of this encounter: 85.4 kg.   Code status:   Code Status: Full Code   DVT Prophylaxis: apixaban (ELIQUIS) tablet 2.5 mg Start: 03/04/22 2200 SCDs Start: 02/27/22 2010 apixaban (ELIQUIS) tablet 2.5 mg   Family Communication: Spouse at bedside   Disposition Plan: Status is: Inpatient Remains inpatient appropriate because: Leukocytosis on IV cefepime-hemodynamically unstable A-fib-on oral amiodarone load.  Likely needs another few more days of hospitalization to ensure stability before discharge.   Planned Discharge Destination:Skilled nursing facility  The patient is critically ill with multiple organ system failure and requires high complexity decision making for assessment and support, frequent evaluation and titration of therapies, advanced monitoring, review of radiographic studies and interpretation of complex data.     Diet: Diet Order             Diet heart healthy/carb modified Room service appropriate? Yes; Fluid consistency: Thin  Diet effective now                     Antimicrobial agents: Anti-infectives (From admission, onward)    Start     Dose/Rate Route Frequency Ordered Stop   03/04/22 1115  ceFEPIme (MAXIPIME) 2 g in sodium chloride 0.9 % 100 mL IVPB        2 g 200 mL/hr over 30 Minutes Intravenous Every 24 hours 03/04/22 1021          MEDICATIONS: Scheduled Meds:  [START ON 03/11/2022] amiodarone  200 mg Oral Daily   amiodarone  400 mg Oral BID   apixaban  2.5 mg Oral BID   Chlorhexidine Gluconate Cloth  6 each Topical Q0600   clonazePAM  0.5 mg Oral QHS   dexamethasone (DECADRON) injection  4 mg Intravenous Q6H   insulin aspart  0-9 Units Subcutaneous Q4H    insulin glargine-yfgn  10 Units Subcutaneous QHS   lidocaine  1 patch Transdermal Q24H   montelukast  10 mg Oral QHS   pantoprazole  40 mg Oral Q1200   polyethylene glycol  17 g Oral BID   rosuvastatin  10 mg Oral Daily   senna  2 tablet Oral Daily   sertraline  25 mg Oral Daily   Continuous Infusions:  sodium chloride Stopped (03/04/22 1818)   ceFEPime (MAXIPIME) IV Stopped (03/04/22 1438)   lactated ringers     PRN Meds:.sodium chloride, acetaminophen, bisacodyl, docusate sodium, melatonin, ondansetron (ZOFRAN) IV   I have personally reviewed following labs and imaging studies  LABORATORY DATA: CBC: Recent Labs  Lab 02/27/22 1438 02/27/22 1512 03/01/22 0550 03/02/22 0246 03/03/22 0816 03/04/22 0605 03/05/22 0100  WBC 20.6*   < > 17.1* 22.5* 23.6* 34.0* 35.4*  NEUTROABS 17.6*  --   --   --   --   --   --   HGB 10.5*   < > 7.8* 7.7* 7.9* 9.0* 8.4*  HCT 33.1*   < > 22.4* 22.9* 23.3* 27.3* 25.1*  MCV 87.6   < > 78.3* 81.2 84.1 85.6 84.2  PLT 259   < >  204 201 176 204 201   < > = values in this interval not displayed.     Basic Metabolic Panel: Recent Labs  Lab 02/27/22 2156 02/28/22 0113 02/28/22 1203 02/28/22 1907 02/28/22 2331 03/01/22 0550 03/02/22 0246 03/03/22 0816 03/03/22 1007 03/04/22 0605 03/05/22 0100  NA 145 144   < > 137 137 136 139 138  --  135 132*  K 3.5 3.1*   < > 2.5* 2.8* 3.7 4.2 4.2  --  4.3 4.1  CL 117* 113*   < > 102 102 98 102 107  --  101 101  CO2 8* 10*   < > 20* '23 26 26 22  '$ --  25 22  GLUCOSE 100* 99   < > 171* 132* 122* 136* 106*  --  118* 129*  BUN 95* 88*   < > 69* 65* 60* 49* 36*  --  35* 38*  CREATININE 5.71* 5.11*   < > 3.47* 3.26* 2.92* 2.40* 1.65*  --  1.65* 1.56*  CALCIUM 8.9 8.8*   < > 7.9* 7.7* 7.5* 8.0* 7.8*  --  8.5* 8.4*  MG 2.4 2.2  --  1.7 2.6* 2.5*  --   --  1.7 2.3 2.0  PHOS 7.4* 6.2*  --  3.0  --  2.5  --   --   --   --  3.6   < > = values in this interval not displayed.     GFR: Estimated Creatinine  Clearance: 27.8 mL/min (A) (by C-G formula based on SCr of 1.56 mg/dL (H)).  Liver Function Tests: Recent Labs  Lab 02/27/22 1438 02/28/22 0113 03/03/22 0816 03/04/22 0605  AST 34  --  43* 43*  ALT 22  --  21 28  ALKPHOS 78  --  41 46  BILITOT 0.6  --  0.6 0.8  PROT 7.2  --  4.7* 5.7*  ALBUMIN 3.5 2.9* 2.2* 2.6*    Recent Labs  Lab 02/27/22 2156  LIPASE 67*    No results for input(s): "AMMONIA" in the last 168 hours.  Coagulation Profile: Recent Labs  Lab 02/27/22 1438  INR 1.1     Cardiac Enzymes: Recent Labs  Lab 02/27/22 2156  CKTOTAL 1,062*     BNP (last 3 results) No results for input(s): "PROBNP" in the last 8760 hours.  Lipid Profile: No results for input(s): "CHOL", "HDL", "LDLCALC", "TRIG", "CHOLHDL", "LDLDIRECT" in the last 72 hours.  Thyroid Function Tests: Recent Labs    03/03/22 0816  TSH 0.429     Anemia Panel: No results for input(s): "VITAMINB12", "FOLATE", "FERRITIN", "TIBC", "IRON", "RETICCTPCT" in the last 72 hours.  Urine analysis:    Component Value Date/Time   COLORURINE YELLOW 03/04/2022 1850   APPEARANCEUR HAZY (A) 03/04/2022 1850   LABSPEC 1.021 03/04/2022 1850   PHURINE 5.0 03/04/2022 1850   GLUCOSEU >=500 (A) 03/04/2022 1850   HGBUR MODERATE (A) 03/04/2022 1850   BILIRUBINUR NEGATIVE 03/04/2022 1850   KETONESUR NEGATIVE 03/04/2022 1850   PROTEINUR 30 (A) 03/04/2022 1850   UROBILINOGEN 0.2 02/07/2013 1859   NITRITE NEGATIVE 03/04/2022 1850   LEUKOCYTESUR NEGATIVE 03/04/2022 1850    Sepsis Labs: Lactic Acid, Venous    Component Value Date/Time   LATICACIDVEN 1.7 03/03/2022 1340    MICROBIOLOGY: Recent Results (from the past 240 hour(s))  Resp Panel by RT-PCR (Flu A&B, Covid) Anterior Nasal Swab     Status: None   Collection Time: 02/27/22  4:59 PM   Specimen: Anterior  Nasal Swab  Result Value Ref Range Status   SARS Coronavirus 2 by RT PCR NEGATIVE NEGATIVE Final    Comment: (NOTE) SARS-CoV-2 target  nucleic acids are NOT DETECTED.  The SARS-CoV-2 RNA is generally detectable in upper respiratory specimens during the acute phase of infection. The lowest concentration of SARS-CoV-2 viral copies this assay can detect is 138 copies/mL. A negative result does not preclude SARS-Cov-2 infection and should not be used as the sole basis for treatment or other patient management decisions. A negative result may occur with  improper specimen collection/handling, submission of specimen other than nasopharyngeal swab, presence of viral mutation(s) within the areas targeted by this assay, and inadequate number of viral copies(<138 copies/mL). A negative result must be combined with clinical observations, patient history, and epidemiological information. The expected result is Negative.  Fact Sheet for Patients:  EntrepreneurPulse.com.au  Fact Sheet for Healthcare Providers:  IncredibleEmployment.be  This test is no t yet approved or cleared by the [place] FDA and  has been authorized for detection and/or diagnosis of SARS-CoV-2 by FDA under an Emergency Use Authorization (EUA). This EUA will remain  in effect (meaning this test can be used) for the duration of the COVID-19 declaration under Section 564(b)(1) of the Act, 21 U.S.C.section 360bbb-3(b)(1), unless the authorization is terminated  or revoked sooner.       Influenza A by PCR NEGATIVE NEGATIVE Final   Influenza B by PCR NEGATIVE NEGATIVE Final    Comment: (NOTE) The Xpert Xpress SARS-CoV-2/FLU/RSV plus assay is intended as an aid in the diagnosis of influenza from Nasopharyngeal swab specimens and should not be used as a sole basis for treatment. Nasal washings and aspirates are unacceptable for Xpert Xpress SARS-CoV-2/FLU/RSV testing.  Fact Sheet for Patients: EntrepreneurPulse.com.au  Fact Sheet for Healthcare  Providers: IncredibleEmployment.be  This test is not yet approved or cleared by the [place] FDA and has been authorized for detection and/or diagnosis of SARS-CoV-2 by FDA under an Emergency Use Authorization (EUA). This EUA will remain in effect (meaning this test can be used) for the duration of the COVID-19 declaration under Section 564(b)(1) of the Act, 21 U.S.C. section 360bbb-3(b)(1), unless the authorization is terminated or revoked.  Performed at [facility], [place] [address] [place], [place] [address]   MRSA Next Gen by PCR, Nasal     Status: None   Collection Time: 02/27/22  9:30 PM   Specimen: Nasal Mucosa; Nasal Swab  Result Value Ref Range Status   MRSA by PCR Next Gen NOT DETECTED NOT DETECTED Final    Comment: (NOTE) The GeneXpert MRSA Assay (FDA approved for NASAL specimens only), is one component of a comprehensive MRSA colonization surveillance program. It is not intended to diagnose MRSA infection nor to guide or monitor treatment for MRSA infections. Test performance is not FDA approved in patients less than 59 years old. Performed at [facility], [place] [address] [place], [place] [address]   Culture, blood (Routine X 2) w Reflex to ID Panel     Status: None (Preliminary result)   Collection Time: 03/04/22 11:26 AM   Specimen: BLOOD  Result Value Ref Range Status   Specimen Description BLOOD BLOOD RIGHT FOREARM  Final   Special Requests   Final    BOTTLES DRAWN AEROBIC ONLY Blood Culture results may not be optimal due to an inadequate volume of blood received in culture bottles    Culture   Final    NO GROWTH <  24 HOURS Performed at [facility], [place] [address], [place], [place] [address]    Report Status PENDING  Incomplete  Culture, blood (Routine X 2) w Reflex to ID Panel     Status: None (Preliminary result)   Collection Time: 03/04/22 11:37 AM   Specimen: BLOOD  Result Value Ref Range Status   Specimen  Description BLOOD BLOOD LEFT FOREARM  Final   Special Requests   Final    BOTTLES DRAWN AEROBIC ONLY Blood Culture results may not be optimal due to an inadequate volume of blood received in culture bottles    Culture   Final    NO GROWTH < 24 HOURS Performed at [facility], [place] [address] [place], [place] [address]    Report Status PENDING  Incomplete    RADIOLOGY STUDIES/RESULTS: DG CHEST PORT 1 VIEW  Result Date: 03/04/2022 CLINICAL DATA:  Acute respiratory failure. EXAM: PORTABLE CHEST 1 VIEW COMPARISON:  02/27/2022 FINDINGS: Slightly improved cardiac contours. Redemonstrated calcified right mediastinal and hilar lymph nodes, likely sequela of prior granulomatous disease. No focal pulmonary opacity, pleural effusion, or pneumothorax. Surgical clips in the right axilla. No acute osseous abnormality. IMPRESSION: No acute cardiopulmonary process. Electronically Signed   By: Merilyn Baba M.D.   On: 03/04/2022 11:50     LOS: 6 days   Oren Binet, MD  [facility]    To contact the attending provider between 7A-7P or the covering provider during after hours 7P-7A, please log into the web site [email or web address] and access using universal [place] password for that web site. If you do not have the password, please call the hospital operator.  03/05/2022, 10:04 AM

## 2022-03-05 NOTE — Progress Notes (Signed)
Physical Therapy Treatment Patient Details Name: Tara [name] MRN: [id number] DOB: 10-May-1946 Today's Date: 03/05/2022   History of Present Illness pt is a 76 y/o female admitted through ED 6/8 with weakness, confusion and progressive worsening of slurred speech.  CT head showing increased size of known meningioma,   ARF found on work up.  6/12>> A-fib RVR with hypotension.  Amiodarone infusion started.  Transferred to ICU-subsequently converted to sinus rhythm.  PMHx: HTN, CHF, NiCM, Breast CA with chemo/fdx    PT Comments    Pt consistently needing encouragement to participate and has shown self-limiting behaviors.  Emphasis on transitions to EOB, scooting, balance at EOB, sit to stand safety and progression of gait stability   Recommendations for follow up therapy are one component of a multi-disciplinary discharge planning process, led by the attending physician.  Recommendations may be updated based on patient status, additional functional criteria and insurance authorization.  Follow Up Recommendations  Home health PT     Assistance Recommended at Discharge Intermittent Supervision/Assistance  Patient can return home with the following A little help with walking and/or transfers;A little help with bathing/dressing/bathroom;Assistance with cooking/housework;Assist for transportation;Help with stairs or ramp for entrance;Direct supervision/assist for medications management;Direct supervision/assist for financial management   Equipment Recommendations  Rolling walker (2 wheels)    Recommendations for Other Services       Precautions / Restrictions Precautions Precautions: Fall Precaution Comments: fearful of falling     Mobility  Bed Mobility Overal bed mobility: Needs Assistance Bed Mobility: Supine to Sit     Supine to sit: Mod assist     General bed mobility comments: weak movement of LE's to EOB, truncal assist, pt pulling with L UE and padding to scoot to and square  up on EOB    Transfers Overall transfer level: Needs assistance   Transfers: Sit to/from Stand, Bed to chair/wheelchair/BSC             General transfer comment: pt came up off the bed without assist once her feet touched the floor.    Ambulation/Gait Ambulation/Gait assistance: Min assist, +2 physical assistance, +2 safety/equipment Gait Distance (Feet): 50 Feet Assistive device: 1 person hand held assist, 2 person hand held assist, IV Pole Gait Pattern/deviations: Step-through pattern   Gait velocity interpretation: <1.31 ft/sec, indicative of household ambulator   General Gait Details: mildly unsteady, short, guarded, slower steps   Stairs             Wheelchair Mobility    Modified Rankin (Stroke Patients Only)       Balance Overall balance assessment: Needs assistance Sitting-balance support: No upper extremity supported, Feet supported Sitting balance-Leahy Scale: Fair       Standing balance-Leahy Scale: Poor Standing balance comment: reliant on external support or UE assist                            Cognition Arousal/Alertness: Lethargic, Awake/alert Behavior During Therapy: Flat affect Overall Cognitive Status:  (NT formally)                                 General Comments: pt had several episodes of closing her eyes, not saying anything or responding to questions as if asleep.        Exercises      General Comments General comments (skin integrity, edema, etc.): VSS overall, sats in the lower  90's on RA      Pertinent Vitals/Pain Pain Assessment Pain Assessment: Faces Faces Pain Scale: No hurt Pain Intervention(s): Monitored during session    Home Living                          Prior Function            PT Goals (current goals can now be found in the care plan section) Acute Rehab PT Goals PT Goal Formulation: With patient Time For Goal Achievement: 03/15/22 Potential to Achieve  Goals: Good Progress towards PT goals: Progressing toward goals    Frequency    Min 3X/week      PT Plan Discharge plan needs to be updated    Co-evaluation PT/OT/SLP Co-Evaluation/Treatment: Yes Reason for Co-Treatment: For patient/therapist safety;To address functional/ADL transfers PT goals addressed during session: Mobility/safety with mobility        AM-PAC PT "6 Clicks" Mobility   Outcome Measure  Help needed turning from your back to your side while in a flat bed without using bedrails?: A Lot Help needed moving from lying on your back to sitting on the side of a flat bed without using bedrails?: A Lot Help needed moving to and from a bed to a chair (including a wheelchair)?: A Lot Help needed standing up from a chair using your arms (e.g., wheelchair or bedside chair)?: A Little Help needed to walk in hospital room?: A Lot Help needed climbing 3-5 steps with a railing? : A Lot 6 Click Score: 13    End of Session   Activity Tolerance: Patient tolerated treatment well;Patient limited by fatigue Patient left: in chair;with call bell/phone within reach;with chair alarm set Nurse Communication: Mobility status PT Visit Diagnosis: Unsteadiness on feet (R26.81);Muscle weakness (generalized) (M62.81);Other symptoms and signs involving the nervous system (R29.898)     Time: 5176-1607 PT Time Calculation (min) (ACUTE ONLY): 23 min  Charges:  $Gait Training: 8-22 mins                     03/05/2022  Tara Carne., PT Acute Rehabilitation Services [phone]  (pager) [phone]  (office)   Tara [name] 03/05/2022, 1:17 PM

## 2022-03-05 NOTE — Progress Notes (Signed)
Progress Note  Patient Name: Tara [name] Date of Encounter: 03/05/2022  Primary Cardiologist: None   Subjective   Overnight has continued to do well Still in SR Started cefepime. No CP, SOB, Palpitations.  Inpatient Medications    Scheduled Meds:  [START ON 03/11/2022] amiodarone  200 mg Oral Daily   amiodarone  400 mg Oral BID   apixaban  2.5 mg Oral BID   Chlorhexidine Gluconate Cloth  6 each Topical Q0600   clonazePAM  0.5 mg Oral QHS   dexamethasone (DECADRON) injection  4 mg Intravenous Q6H   insulin aspart  0-9 Units Subcutaneous Q4H   insulin glargine-yfgn  10 Units Subcutaneous QHS   lidocaine  1 patch Transdermal Q24H   montelukast  10 mg Oral QHS   pantoprazole  40 mg Oral Q1200   polyethylene glycol  17 g Oral BID   rosuvastatin  10 mg Oral Daily   senna  2 tablet Oral Daily   sertraline  25 mg Oral Daily   Continuous Infusions:  sodium chloride Stopped (03/04/22 1818)   ceFEPime (MAXIPIME) IV Stopped (03/04/22 1438)   lactated ringers     PRN Meds: sodium chloride, acetaminophen, bisacodyl, docusate sodium, melatonin, ondansetron (ZOFRAN) IV   Vital Signs    Vitals:   03/05/22 0600 03/05/22 0700 03/05/22 0745 03/05/22 0800  BP:    136/67  Pulse: 61 (!) 59  64  Resp: 12 13  (!) 24  Temp:   (!) 97.5 F (36.4 C)   TempSrc:   Oral   SpO2:    96%  Weight:      Height:        Intake/Output Summary (Last 24 hours) at 03/05/2022 0832 Last data filed at 03/05/2022 0600 Gross per 24 hour  Intake 697.17 ml  Output 600 ml  Net 97.17 ml   Filed Weights   03/03/22 0338 03/04/22 0500 03/05/22 0400  Weight: 84.2 kg 84.5 kg 85.4 kg    Telemetry    SR to sinus bradycardia - Personally Reviewed  Physical Exam   Gen: no distress, morbid obesity   Neck: No JVD Cardiac: No Rubs or Gallops, systolic murmur, RRR +2 radial pulses Respiratory: Clear to auscultation bilaterally, normal effort, normal  respiratory rate GI: Soft, nontender,  non-distended  MS: No  edema;  moves all extremities Integument: Skin feels warm Neuro:  At time of evaluation, alert and oriented to person/place/time/situation  Psych: somnolent affect, patient feels well   Labs    Chemistry Recent Labs  Lab 02/27/22 1438 02/27/22 1512 02/28/22 0113 02/28/22 1203 03/03/22 0816 03/04/22 0605 03/05/22 0100  NA 143   < > 144   < > 138 135 132*  K 3.7   < > 3.1*   < > 4.2 4.3 4.1  CL 116*   < > 113*   < > 107 101 101  CO2 <7*   < > 10*   < > '22 25 22  '$ GLUCOSE 120*   < > 99   < > 106* 118* 129*  BUN 97*   < > 88*   < > 36* 35* 38*  CREATININE 6.34*   < > 5.11*   < > 1.65* 1.65* 1.56*  CALCIUM 9.0   < > 8.8*   < > 7.8* 8.5* 8.4*  PROT 7.2  --   --   --  4.7* 5.7*  --   ALBUMIN 3.5  --  2.9*  --  2.2* 2.6*  --  AST 34  --   --   --  43* 43*  --   ALT 22  --   --   --  21 28  --   ALKPHOS 78  --   --   --  41 46  --   BILITOT 0.6  --   --   --  0.6 0.8  --   GFRNONAA 6*   < > 8*   < > 32* 32* 34*  ANIONGAP NOT CALCULATED   < > 21*   < > '9 9 9   '$ < > = values in this interval not displayed.     Hematology Recent Labs  Lab 03/03/22 0816 03/04/22 0605 03/05/22 0100  WBC 23.6* 34.0* 35.4*  RBC 2.77* 3.19* 2.98*  HGB 7.9* 9.0* 8.4*  HCT 23.3* 27.3* 25.1*  MCV 84.1 85.6 84.2  MCH 28.5 28.2 28.2  MCHC 33.9 33.0 33.5  RDW 17.2* 17.0* 16.4*  PLT 176 204 201    Cardiac EnzymesNo results for input(s): "TROPONINI" in the last 168 hours. No results for input(s): "TROPIPOC" in the last 168 hours.   BNP Recent Labs  Lab 02/27/22 1659  BNP 157.6*     DDimer No results for input(s): "DDIMER" in the last 168 hours.   Radiology    DG CHEST PORT 1 VIEW  Result Date: 03/04/2022 CLINICAL DATA:  Acute respiratory failure. EXAM: PORTABLE CHEST 1 VIEW COMPARISON:  02/27/2022 FINDINGS: Slightly improved cardiac contours. Redemonstrated calcified right mediastinal and hilar lymph nodes, likely sequela of prior granulomatous disease. No focal  pulmonary opacity, pleural effusion, or pneumothorax. Surgical clips in the right axilla. No acute osseous abnormality. IMPRESSION: No acute cardiopulmonary process. Electronically Signed   By: Merilyn Baba M.D.   On: 03/04/2022 11:50    Patient Profile     76 y.o. female AF RVR and hypotension  Assessment & Plan    Atrial fibrillation with RVR HTN and HLD Breast cancer with prior chemo-radiation Hypotension - suspect related to diltiazem IV bolus on presentation HFrEF Morbid obesity - in the setting of meningioma and slurred speech (resolved) - CHADVASC 7, on eliquis - presently sinus rhythm on PO amiodarone load - rosuvastatin 10 mg  - given her AKI and hypotension we will slowly work to get her back on GDMT; some of this may be done as outpatient; AKI continues to improve and goal is for kidney recovery so she can get MRI; today heart rate too low for re-initiation of succinate  Husband has many questions about his right middle finger pain in the setting of being 3 weeks post Carpal tunnel release.  Unable to make recommendations as his wife's cardiologist   For questions or updates, please contact [facility] Please consult [email or web address] for contact info under Cardiology/STEMI.      Rudean Haskell, MD Cardiologist [facility] [place], [address] [place], [place] [phone]  8:32 AM

## 2022-03-05 NOTE — Progress Notes (Signed)
Occupational Therapy Treatment Patient Details Name: Tara [name] MRN: [id number] DOB: [birth year] Today's Date: 03/05/2022   History of present illness pt is a 76 y/o female admitted through ED 6/8 with weakness, confusion and progressive worsening of slurred speech.  CT head showing increased size of known meningioma,   ARF found on work up.  6/12>> A-fib RVR with hypotension.  Amiodarone infusion started.  Transferred to ICU-subsequently converted to sinus rhythm.  PMHx: HTN, CHF, NiCM, Breast CA with chemo/fdx   OT comments  Patient supine in bed and with encouragement of spouse, pt agreeable to OT/PT session.  Patient self limiting during session, difficult to engage and prefers to keep her eyes closed.  Following simple commands with increased time, if agreeable. Completing transfers and mobility with min-min guard assist, ADLs with min-min guard assist.  Updated dc plan to [place].  Will follow acutely.    Recommendations for follow up therapy are one component of a multi-disciplinary discharge planning process, led by the attending physician.  Recommendations may be updated based on patient status, additional functional criteria and insurance authorization.    Follow Up Recommendations  Home health OT    Assistance Recommended at Discharge Frequent or constant Supervision/Assistance  Patient can return home with the following  A little help with walking and/or transfers;A little help with bathing/dressing/bathroom;Assistance with cooking/housework;Direct supervision/assist for medications management;Direct supervision/assist for financial management;Assist for transportation;Help with stairs or ramp for entrance   Equipment Recommendations  None recommended by OT    Recommendations for Other Services      Precautions / Restrictions Precautions Precautions: Fall Precaution Comments: fearful of falling Restrictions Weight Bearing Restrictions: No       Mobility Bed  Mobility Overal bed mobility: Needs Assistance Bed Mobility: Supine to Sit     Supine to sit: Mod assist     General bed mobility comments: weak movement of LE's to EOB, truncal assist, pt pulling with L UE and padding to scoot to and square up on EOB    Transfers Overall transfer level: Needs assistance   Transfers: Sit to/from Stand Sit to Stand: Min guard           General transfer comment: for safety     Balance Overall balance assessment: Needs assistance Sitting-balance support: No upper extremity supported, Feet supported Sitting balance-Leahy Scale: Fair       Standing balance-Leahy Scale: Poor Standing balance comment: reliant on external support or UE assist                           ADL either performed or assessed with clinical judgement   ADL Overall ADL's : Needs assistance/impaired                 Upper Body Dressing : Set up;Sitting   Lower Body Dressing: Minimal assistance;Sit to/from stand   Toilet Transfer: Min guard;Ambulation           Functional mobility during ADLs: Min guard;+2 for safety/equipment      Extremity/Trunk Assessment Upper Extremity Assessment Upper Extremity Assessment: Generalized weakness            Vision       Perception     Praxis      Cognition Arousal/Alertness: Lethargic, Awake/alert Behavior During Therapy: Flat affect Overall Cognitive Status: Difficult to assess  General Comments: Pt presents with flat affect, very limited engagement.  She follows simple commands with increased time, but typically closes eyes and does not respond to therapist.        Exercises      Shoulder Instructions       General Comments VSS on RA    Pertinent Vitals/ Pain       Pain Assessment Pain Assessment: Faces Faces Pain Scale: No hurt Pain Intervention(s): Monitored during session  Home Living                                           Prior Functioning/Environment              Frequency  Min 2X/week        Progress Toward Goals  OT Goals(current goals can now be found in the care plan section)  Progress towards OT goals: Progressing toward goals  Acute Rehab OT Goals Patient Stated Goal: home OT Goal Formulation: With patient/family Time For Goal Achievement: 03/15/22 Potential to Achieve Goals: Good  Plan Frequency remains appropriate;Discharge plan needs to be updated    Co-evaluation    PT/OT/SLP Co-Evaluation/Treatment: Yes Reason for Co-Treatment: For patient/therapist safety;To address functional/ADL transfers PT goals addressed during session: Mobility/safety with mobility OT goals addressed during session: ADL's and self-care      AM-PAC OT "6 Clicks" Daily Activity     Outcome Measure   Help from another person eating meals?: A Little Help from another person taking care of personal grooming?: A Little Help from another person toileting, which includes using toliet, bedpan, or urinal?: A Lot Help from another person bathing (including washing, rinsing, drying)?: A Little Help from another person to put on and taking off regular upper body clothing?: A Little Help from another person to put on and taking off regular lower body clothing?: A Lot 6 Click Score: 16    End of Session    OT Visit Diagnosis: Unsteadiness on feet (R26.81);Other abnormalities of gait and mobility (R26.89);Muscle weakness (generalized) (M62.81)   Activity Tolerance Patient tolerated treatment well   Patient Left in chair;with call bell/phone within reach;with chair alarm set;with family/visitor present   Nurse Communication Mobility status        Time: 1209-1232 OT Time Calculation (min): 23 min  Charges: OT General Charges $OT Visit: 1 Visit OT Treatments $Self Care/Home Management : 8-22 mins  Jolaine Artist, OT [place] [phone]   Delight Stare 03/05/2022, 1:40 PM

## 2022-03-06 DIAGNOSIS — I4891 Unspecified atrial fibrillation: Secondary | ICD-10-CM | POA: Diagnosis not present

## 2022-03-06 DIAGNOSIS — N179 Acute kidney failure, unspecified: Secondary | ICD-10-CM | POA: Diagnosis not present

## 2022-03-06 DIAGNOSIS — I5022 Chronic systolic (congestive) heart failure: Secondary | ICD-10-CM | POA: Diagnosis not present

## 2022-03-06 DIAGNOSIS — D429 Neoplasm of uncertain behavior of meninges, unspecified: Secondary | ICD-10-CM | POA: Diagnosis not present

## 2022-03-06 LAB — CBC
HCT: 24.2 % — ABNORMAL LOW (ref 36.0–46.0)
Hemoglobin: 8.3 g/dL — ABNORMAL LOW (ref 12.0–15.0)
MCH: 28.7 pg (ref 26.0–34.0)
MCHC: 34.3 g/dL (ref 30.0–36.0)
MCV: 83.7 fL (ref 80.0–100.0)
Platelets: 125 10*3/uL — ABNORMAL LOW (ref 150–400)
RBC: 2.89 MIL/uL — ABNORMAL LOW (ref 3.87–5.11)
RDW: 16.2 % — ABNORMAL HIGH (ref 11.5–15.5)
WBC: 25.3 10*3/uL — ABNORMAL HIGH (ref 4.0–10.5)
nRBC: 0.1 % (ref 0.0–0.2)

## 2022-03-06 LAB — GLUCOSE, CAPILLARY
Glucose-Capillary: 101 mg/dL — ABNORMAL HIGH (ref 70–99)
Glucose-Capillary: 105 mg/dL — ABNORMAL HIGH (ref 70–99)
Glucose-Capillary: 126 mg/dL — ABNORMAL HIGH (ref 70–99)
Glucose-Capillary: 134 mg/dL — ABNORMAL HIGH (ref 70–99)
Glucose-Capillary: 141 mg/dL — ABNORMAL HIGH (ref 70–99)
Glucose-Capillary: 168 mg/dL — ABNORMAL HIGH (ref 70–99)
Glucose-Capillary: 181 mg/dL — ABNORMAL HIGH (ref 70–99)
Glucose-Capillary: 99 mg/dL (ref 70–99)

## 2022-03-06 LAB — BASIC METABOLIC PANEL
Anion gap: 10 (ref 5–15)
BUN: 42 mg/dL — ABNORMAL HIGH (ref 8–23)
CO2: 21 mmol/L — ABNORMAL LOW (ref 22–32)
Calcium: 8.1 mg/dL — ABNORMAL LOW (ref 8.9–10.3)
Chloride: 101 mmol/L (ref 98–111)
Creatinine, Ser: 1.53 mg/dL — ABNORMAL HIGH (ref 0.44–1.00)
GFR, Estimated: 35 mL/min — ABNORMAL LOW (ref >60)
Glucose, Bld: 97 mg/dL (ref 70–99)
Potassium: 4.5 mmol/L (ref 3.5–5.1)
Sodium: 132 mmol/L — ABNORMAL LOW (ref 135–145)

## 2022-03-06 MED ORDER — METOPROLOL SUCCINATE ER 50 MG PO TB24: 50.0000 mg | ORAL_TABLET | Freq: Two times a day (BID) | ORAL | Status: DC

## 2022-03-06 MED ORDER — METOPROLOL SUCCINATE ER 50 MG PO TB24
50.0000 mg | ORAL_TABLET | Freq: Every day | ORAL | Status: DC
  Administered 2022-03-06 – 2022-03-07 (×2): 50 mg via ORAL
  Filled 2022-03-06 (×3): qty 1

## 2022-03-06 MED ORDER — SACUBITRIL-VALSARTAN 24-26 MG PO TABS
1.0000 | ORAL_TABLET | Freq: Two times a day (BID) | ORAL | Status: DC
  Administered 2022-03-06 – 2022-03-07 (×4): 1 via ORAL
  Filled 2022-03-06 (×4): qty 1

## 2022-03-06 MED ORDER — DEXAMETHASONE 2 MG PO TABS
2.0000 mg | ORAL_TABLET | Freq: Three times a day (TID) | ORAL | Status: DC
  Administered 2022-03-06 – 2022-03-08 (×5): 2 mg via ORAL
  Filled 2022-03-06 (×7): qty 1

## 2022-03-06 NOTE — Progress Notes (Addendum)
Physical Therapy Treatment Patient Details Name: Tara [name] MRN: [id number] DOB: May 06, 1946 Today's Date: 03/06/2022   History of Present Illness 76 y/o female admitted 6/8 with weakness, confusion and progressive worsening of slurred speech.  CT head: increased size of known meningioma,   ARF found on work up.  6/12 A-fib RVR with hypotension.  PMHx: HTN, CHF, NiCM, Breast CA with chemo/fdx    PT Comments    Pt with flat affect with encouragement to participate and increase gait distance. Spouse reports he now wants to go to SNF as he is unsure if he can provide current assist needed. Pt with decreased balance, gait and strength but progressing slowly with education for HEP and continued ambulation trials with staff.   SPO2 90-93% on RA HR 72-85    Recommendations for follow up therapy are one component of a multi-disciplinary discharge planning process, led by the attending physician.  Recommendations may be updated based on patient status, additional functional criteria and insurance authorization.  Follow Up Recommendations  Skilled nursing-short term rehab (<3 hours/day) if pt refuses [facility] Recommended at Discharge Frequent or constant Supervision/Assistance  Patient can return home with the following A little help with walking and/or transfers;A little help with bathing/dressing/bathroom;Assistance with cooking/housework;Assist for transportation;Help with stairs or ramp for entrance;Direct supervision/assist for medications management;Direct supervision/assist for financial management   Equipment Recommendations  Rolling walker (2 wheels)    Recommendations for Other Services       Precautions / Restrictions Precautions Precautions: Fall     Mobility  Bed Mobility Overal bed mobility: Needs Assistance Bed Mobility: Rolling, Supine to Sit Rolling: Min assist   Supine to sit: Min assist     General bed mobility comments: cues for sequence with  assist and rail to roll bil for pericare and linen change. min assist to lift trunk to transition from supine to sitting    Transfers Overall transfer level: Needs assistance   Transfers: Sit to/from Stand Sit to Stand: Min guard           General transfer comment: cues for hand placement to rise and sit, min assist to fully scoot back in chair    Ambulation/Gait Ambulation/Gait assistance: Min assist Gait Distance (Feet): 140 Feet Assistive device: 1 person hand held assist Gait Pattern/deviations: Step-through pattern, Decreased stride length, Wide base of support, Shuffle   Gait velocity interpretation: <1.8 ft/sec, indicate of risk for recurrent falls   General Gait Details: wide BOS with short steps and reliance on UE support. Encouragement to maximize distance and function   Stairs             Wheelchair Mobility    Modified Rankin (Stroke Patients Only)       Balance Overall balance assessment: Needs assistance   Sitting balance-Leahy Scale: Fair Sitting balance - Comments: static sitting   Standing balance support: Single extremity supported Standing balance-Leahy Scale: Poor Standing balance comment: UE support in standing                            Cognition Arousal/Alertness: Awake/alert Behavior During Therapy: Flat affect Overall Cognitive Status: Difficult to assess                                 General Comments: pt oriented and answering questions appropriately with mostly single word responses, very flat  Exercises General Exercises - Lower Extremity Long Arc Quad: AROM, Both, Seated, 10 reps Hip Flexion/Marching: AROM, Both, Seated, 10 reps    General Comments        Pertinent Vitals/Pain Pain Assessment Pain Assessment: No/denies pain    Home Living                          Prior Function            PT Goals (current goals can now be found in the care plan section) Progress  towards PT goals: Progressing toward goals    Frequency    Min 3X/week      PT Plan Discharge plan needs to be updated    Co-evaluation              AM-PAC PT "6 Clicks" Mobility   Outcome Measure  Help needed turning from your back to your side while in a flat bed without using bedrails?: A Little Help needed moving from lying on your back to sitting on the side of a flat bed without using bedrails?: A Little Help needed moving to and from a bed to a chair (including a wheelchair)?: A Little Help needed standing up from a chair using your arms (e.g., wheelchair or bedside chair)?: A Little Help needed to walk in hospital room?: A Little Help needed climbing 3-5 steps with a railing? : Total 6 Click Score: 16    End of Session Equipment Utilized During Treatment: Gait belt Activity Tolerance: Patient tolerated treatment well Patient left: in chair;with call bell/phone within reach;with family/visitor present;with chair alarm set Nurse Communication: Mobility status PT Visit Diagnosis: Unsteadiness on feet (R26.81);Muscle weakness (generalized) (M62.81);Other symptoms and signs involving the nervous system (R29.898)     Time: 5809-9833 PT Time Calculation (min) (ACUTE ONLY): 31 min  Charges:  $Gait Training: 8-22 mins $Therapeutic Activity: 8-22 mins                     Bayard Males, PT Acute Rehabilitation Services Office: [place] 03/06/2022, 9:26 AM

## 2022-03-06 NOTE — Progress Notes (Addendum)
RE: Tara [name]. [place]   Date of Birth: August 26, 1946  Date: 03/06/2022  To Whom It May Concern:  Please be advised that the above-named patient will require a short-term nursing home stay - anticipated 30 days or less for rehabilitation and strengthening. The plan is for return home.

## 2022-03-06 NOTE — Care Management Important Message (Signed)
Important Message  Patient Details  Name: Tara [name] MRN: [id number] Date of Birth: 1945-12-01   Medicare Important Message Given:  Yes     Shelda Altes 03/06/2022, 7:57 AM

## 2022-03-06 NOTE — NC FL2 (Cosign Needed Addendum)
[place] MEDICAID FL2 LEVEL OF CARE SCREENING TOOL     IDENTIFICATION  Patient Name: Tara [name] Birthdate: 09-Jun-1946 Sex: female Admission Date (Current Location): 02/27/2022  [facility] [place] Number:  Herbalist and Address:  [facility] [place], [place] [address] [place], [place] [address]      Provider Number: [id number]  Attending Physician Name and Address:  Jonetta Osgood, MD  Relative Name and Phone Number:  Jeneen Rinks (Spouse) [phone]    Current Level of Care: Hospital Recommended Level of Care: [place] Prior Approval Number:    Date Approved/Denied:   PASRR Number: [id number]  Discharge Plan: SNF    Current Diagnoses: Patient Active Problem List   Diagnosis Date Noted   Atrial fibrillation with RVR ([place])    Metabolic acidosis 22/97/9892   Acute renal failure superimposed on stage 3b chronic kidney disease ([place]) 02/27/2022   Anxiety and depression 02/27/2022   Meningioma, cerebral ([place]) 02/27/2022   Normocytic anemia 02/27/2022   Acute renal failure (ARF) ([place]) 02/27/2022   Hemorrhoids 01/04/2021   Ankle deformity, right 09/12/2020   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   Port-A-Cath in place 09/28/2018   Colon adenomas 08/06/2018   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 ([place]) 06/09/2018   Hypertension 01/25/2018   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   Demand ischemia ([place])    Prolonged Q-T interval on ECG 09/29/2016   Chronic kidney disease (CKD), stage III (moderate) ([place]) 09/29/2016   Leukocytosis 09/18/2016   Abnormal CT scan, chest    Upper airway cough syndrome  vs Cough variant asthma  04/09/2016   Seasonal allergies 08/08/2014   Family history of colon cancer 03/30/2014   Osteoporosis 11/94/1740   Metabolic syndrome X 81/44/8185   SVT (supraventricular tachycardia) ([place]) 63/14/9702   Chronic systolic CHF (congestive heart failure) ([place]) 03/14/2013   Vitamin D  deficiency 07/23/2010   Morbid obesity ([place]) 07/04/2009   Malignant neoplasm of right breast ([place]) 12/13/2007   Hyperlipidemia 12/13/2007   Anxiety state 12/13/2007   Depression, major, single episode, in partial remission ([place]) 12/13/2007   GERD 12/13/2007    Orientation RESPIRATION BLADDER Height & Weight     Self, Time, Situation, Place  Normal Continent, External catheter (External Urinary Catheter) Weight: 190 lb 7.6 oz (86.4 kg) Height:  '4\' 9"'$  (144.8 cm)  BEHAVIORAL SYMPTOMS/MOOD NEUROLOGICAL BOWEL NUTRITION STATUS      Incontinent Diet (Please see discharge summary)  AMBULATORY STATUS COMMUNICATION OF NEEDS Skin   Limited Assist Verbally Other (Comment) (Appropriate for ethnicity,dry,intact,Ecchymosis,arm,bilateral,non-tenting)                       Personal Care Assistance Level of Assistance  Bathing, Feeding, Dressing Bathing Assistance: Limited assistance Feeding assistance: Independent (Able to feed self;Cardiac Carb modified) Dressing Assistance: Limited assistance     Functional Limitations Info  Sight, Hearing, Speech Sight Info: Impaired Hearing Info: Adequate Speech Info: Adequate    SPECIAL CARE FACTORS FREQUENCY  PT (By licensed PT), OT (By licensed OT)     PT Frequency: 5x min weekly OT Frequency: 5x min weekly            Contractures Contractures Info: Not present    Additional Factors Info  Code Status, Allergies, Insulin Sliding Scale, Psychotropic Code Status Info: FULL Allergies Info: Aspirin,Lisinopril,Sudafed (pseudoephedrine Hcl) Psychotropic Info: sertraline (ZOLOFT) tablet 25 mg daily,clonazePAM (KLONOPIN) tablet 0.5 mg daily at bedtime Insulin Sliding Scale Info: insulin aspart (  novoLOG) injection 0-9 Units every 4 hours,insulin glargine-yfgn (SEMGLEE) injection 10 Units daily at bedtime       Current Medications (03/06/2022):  This is the current hospital active medication list Current Facility-Administered Medications   Medication Dose Route Frequency Provider Last Rate Last Admin   0.9 %  sodium chloride infusion   Intravenous PRN Jacky Kindle, MD   Stopped at 03/04/22 1818   acetaminophen (TYLENOL) tablet 500 mg  500 mg Oral Q6H PRN Jacky Kindle, MD   500 mg at 03/05/22 0354   [START ON 03/11/2022] amiodarone (PACERONE) tablet 200 mg  200 mg Oral Daily Chandrasekhar, Mahesh A, MD       amiodarone (PACERONE) tablet 400 mg  400 mg Oral BID Chandrasekhar, Mahesh A, MD   400 mg at 03/06/22 1001   apixaban (ELIQUIS) tablet 2.5 mg  2.5 mg Oral BID Dang, Thuy D, RPH   2.5 mg at 03/06/22 1003   bisacodyl (DULCOLAX) suppository 10 mg  10 mg Rectal Daily PRN Jonetta Osgood, MD       ceFEPIme (MAXIPIME) 2 g in sodium chloride 0.9 % 100 mL IVPB  2 g Intravenous Q24H Mannam, Praveen, MD 200 mL/hr at 03/05/22 1015 2 g at 03/05/22 1015   Chlorhexidine Gluconate Cloth 2 % PADS 6 each  6 each Topical Q0600 Julian Hy, DO   6 each at 03/04/22 0520   clonazePAM (KLONOPIN) tablet 0.5 mg  0.5 mg Oral QHS Jonetta Osgood, MD   0.5 mg at 03/05/22 2107   dexamethasone (DECADRON) injection 4 mg  4 mg Intravenous Q6H Orma Flaming, MD   4 mg at 03/06/22 0510   docusate sodium (COLACE) capsule 100 mg  100 mg Oral BID PRN Lestine Mount, PA-C   100 mg at 03/01/22 2148   insulin aspart (novoLOG) injection 0-9 Units  0-9 Units Subcutaneous Q4H [place] Crane M, PA-C   1 Units at 03/06/22 0026   insulin glargine-yfgn (SEMGLEE) injection 10 Units  10 Units Subcutaneous QHS Jacky Kindle, MD   10 Units at 03/05/22 2105   lactated ringers bolus 500 mL  500 mL Intravenous Once Ghimire, Henreitta Leber, MD       lidocaine (LIDODERM) 5 % 1 patch  1 patch Transdermal Q24H Noemi Chapel P, DO   1 patch at 03/06/22 1003   melatonin tablet 5 mg  5 mg Oral QHS PRN Mannam, Praveen, MD   5 mg at 03/05/22 2107   metoprolol succinate (TOPROL-XL) 24 hr tablet 50 mg  50 mg Oral Daily Margie Billet, NP   50 mg at 03/06/22 1002   montelukast  (SINGULAIR) tablet 10 mg  10 mg Oral QHS Noemi Chapel P, DO   10 mg at 03/05/22 2107   ondansetron (ZOFRAN) injection 4 mg  4 mg Intravenous Q8H PRN Orma Flaming, MD   4 mg at 03/01/22 1151   pantoprazole (PROTONIX) EC tablet 40 mg  40 mg Oral Q1200 Jonetta Osgood, MD   40 mg at 03/05/22 1133   polyethylene glycol (MIRALAX / GLYCOLAX) packet 17 g  17 g Oral BID Jonetta Osgood, MD   17 g at 03/05/22 2106   rosuvastatin (CRESTOR) tablet 10 mg  10 mg Oral Daily Jonetta Osgood, MD   10 mg at 03/06/22 1001   sacubitril-valsartan (ENTRESTO) 24-26 mg per tablet  1 tablet Oral BID Margie Billet, NP   1 tablet at 03/06/22 1002   senna (SENOKOT) tablet 17.2 mg  2 tablet  Oral Daily Jonetta Osgood, MD   17.2 mg at 03/05/22 4481   sertraline (ZOLOFT) tablet 25 mg  25 mg Oral Daily Jonetta Osgood, MD   25 mg at 03/06/22 1002     Discharge Medications: Please see discharge summary for a list of discharge medications.  Relevant Imaging Results:  Relevant Lab Results:   Additional Information [id number], Both Covid Vaccines 1 booster  Milas Gain, LCSWA

## 2022-03-06 NOTE — TOC Initial Note (Addendum)
Transition of Care [facility]) - Initial/Assessment Note    Patient Details  Name: Tara [name] MRN: [id number] Date of Birth: 24-Aug-1946  Transition of Care [facility]) CM/SW Contact:    Tara [name], [place] Phone Number: 03/06/2022, 11:32 AM  Clinical Narrative:                  CSW received consult for possible SNF placement at time of discharge. CSW spoke with patient and patients spouse at bedside regarding PT recommendation of SNF placement at time of discharge.  Patient with patients spouse expressed understanding of PT recommendation and is agreeable to SNF placement at time of discharge. Patient and patients spouse gave CSW permission to fax out initial referral near the [place] and [place] area.CSW discussed insurance authorization process with patient and patients spouse. Patients spouse reports patient has received both Covid Vaccines as well as 1 booster. Patient gave CSW permission to add her son Tara [name] to contacts.Patients Passr is currently pending. CSW submitted requested clinicals to [place] must for review.No further questions reported at this time. CSW to continue to follow and assist with discharge planning needs.   Expected Discharge Plan: Skilled Nursing Facility Barriers to Discharge: Continued Medical Work up   Patient Goals and CMS Choice Patient states their goals for this hospitalization and ongoing recovery are:: SNF CMS Medicare.gov Compare Post Acute Care list provided to:: Patient (Patient and Patients spouse) Choice offered to / list presented to : Patient, Spouse  Expected Discharge Plan and Services Expected Discharge Plan: [place] In-house Referral: Clinical Social Work Discharge Planning Services: CM Consult   Living arrangements for the past 2 months: [place]                                      Prior Living Arrangements/Services Living arrangements for the past 2 months: Single Family Home Lives with::  Spouse Patient language and need for interpreter reviewed:: Yes Do you feel safe going back to the place where you live?: No   SNF  Need for Family Participation in Patient Care: Yes (Comment) Care giver support system in place?: Yes (comment)   Criminal Activity/Legal Involvement Pertinent to Current Situation/Hospitalization: No - Comment as needed  Activities of Daily Living Home Assistive Devices/Equipment: Eyeglasses ADL Screening (condition at time of admission) Patient's cognitive ability adequate to safely complete daily activities?: Yes Is the patient deaf or have difficulty hearing?: No Does the patient have difficulty seeing, even when wearing glasses/contacts?: Yes Does the patient have difficulty concentrating, remembering, or making decisions?: Yes Patient able to express need for assistance with ADLs?: Yes Does the patient have difficulty dressing or bathing?: No Independently performs ADLs?: No Communication: Independent Dressing (OT): Needs assistance Is this a change from baseline?: Change from baseline, expected to last >3 days Grooming: Independent Feeding: Independent Bathing: Needs assistance Is this a change from baseline?: Change from baseline, expected to last >3 days Toileting: Needs assistance Is this a change from baseline?: Change from baseline, expected to last >3days In/Out Bed: Needs assistance Is this a change from baseline?: Change from baseline, expected to last >3 days Walks in Home: Independent Does the patient have difficulty walking or climbing stairs?: No Weakness of Legs: None Weakness of Arms/Hands: None  Permission Sought/Granted Permission sought to share information with : Case Manager, Family Supports, Chartered certified accountant granted to share information with : Yes, Verbal Permission  Granted  Share Information with NAME: Tara [name]  Permission granted to share info w AGENCY: SNF  Permission granted to share info w  Relationship: Spouse  Permission granted to share info w Contact Information: Tara [name] [phone]  Emotional Assessment Appearance:: Appears stated age Attitude/Demeanor/Rapport: Gracious Affect (typically observed): Calm Orientation: : Oriented to Self, Oriented to Place, Oriented to  Time, Oriented to Situation Alcohol / Substance Use: Not Applicable Psych Involvement: No (comment)  Admission diagnosis:  AKI (acute kidney injury) ([place]) [N17.9] Acute renal failure (ARF) ([place]) [N17.9] Patient Active Problem List   Diagnosis Date Noted   Atrial fibrillation with RVR ([place])    Metabolic acidosis 30/16/0109   Acute renal failure superimposed on stage 3b chronic kidney disease ([place]) 02/27/2022   Anxiety and depression 02/27/2022   Meningioma, cerebral ([place]) 02/27/2022   Normocytic anemia 02/27/2022   Acute renal failure (ARF) ([place]) 02/27/2022   Hemorrhoids 01/04/2021   Ankle deformity, right 09/12/2020   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   Port-A-Cath in place 09/28/2018   Colon adenomas 08/06/2018   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 ([place]) 06/09/2018   Hypertension 01/25/2018   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   Demand ischemia (HCC)    Prolonged Q-T interval on ECG 09/29/2016   Chronic kidney disease (CKD), stage III (moderate) ([place]) 09/29/2016   Leukocytosis 09/18/2016   Abnormal CT scan, chest    Upper airway cough syndrome  vs Cough variant asthma  04/09/2016   Seasonal allergies 08/08/2014   Family history of colon cancer 03/30/2014   Osteoporosis 32/35/5732   Metabolic syndrome X 20/25/4270   SVT (supraventricular tachycardia) ([place]) 62/37/6283   Chronic systolic CHF (congestive heart failure) ([place]) 03/14/2013   Vitamin D deficiency 07/23/2010   Morbid obesity ([place]) 07/04/2009   Malignant neoplasm of right breast ([place]) 12/13/2007   Hyperlipidemia 12/13/2007   Anxiety state 12/13/2007   Depression, major, single episode, in partial  remission ([place]) 12/13/2007   GERD 12/13/2007   PCP:  [name], MD Pharmacy:   [facility], [address] [place] [address] Phone: [phone] Fax: [phone]     Social Determinants of Health (SDOH) Interventions    Readmission Risk Interventions     No data to display

## 2022-03-06 NOTE — Progress Notes (Addendum)
Progress Note  Patient Name: Tara [name] Date of Encounter: 03/06/2022  [facility] Cardiologist: Dr. Aundra Dubin  Subjective   She is eating breakfast, has ambulated with PT today, denied any SOB, chest pain, oliguria.   Inpatient Medications    Scheduled Meds:  [START ON 03/11/2022] amiodarone  200 mg Oral Daily   amiodarone  400 mg Oral BID   apixaban  2.5 mg Oral BID   Chlorhexidine Gluconate Cloth  6 each Topical Q0600   clonazePAM  0.5 mg Oral QHS   dexamethasone (DECADRON) injection  4 mg Intravenous Q6H   insulin aspart  0-9 Units Subcutaneous Q4H   insulin glargine-yfgn  10 Units Subcutaneous QHS   lidocaine  1 patch Transdermal Q24H   metoprolol succinate  50 mg Oral Daily   montelukast  10 mg Oral QHS   pantoprazole  40 mg Oral Q1200   polyethylene glycol  17 g Oral BID   rosuvastatin  10 mg Oral Daily   sacubitril-valsartan  1 tablet Oral BID   senna  2 tablet Oral Daily   sertraline  25 mg Oral Daily   Continuous Infusions:  sodium chloride Stopped (03/04/22 1818)   ceFEPime (MAXIPIME) IV 2 g (03/05/22 1015)   lactated ringers     PRN Meds: sodium chloride, acetaminophen, bisacodyl, docusate sodium, melatonin, ondansetron (ZOFRAN) IV   Vital Signs    Vitals:   03/05/22 1417 03/05/22 1955 03/06/22 0417 03/06/22 0921  BP: 135/69 133/60 (!) 139/55   Pulse: 70 63 69 85  Resp: '20 19 20   '$ Temp: 98.7 F (37.1 C) 98.7 F (37.1 C) 97.6 F (36.4 C)   TempSrc: Oral Oral Oral   SpO2: 93% 97% 98% 93%  Weight: 86.3 kg  86.4 kg   Height:        Intake/Output Summary (Last 24 hours) at 03/06/2022 0932 Last data filed at 03/06/2022 0422 Gross per 24 hour  Intake --  Output 1126 ml  Net -1126 ml      03/06/2022    4:17 AM 03/05/2022    2:17 PM 03/05/2022    4:00 AM  Last 3 Weights  Weight (lbs) 190 lb 7.6 oz 190 lb 4.1 oz 188 lb 4.4 oz  Weight (kg) 86.4 kg 86.3 kg 85.4 kg      Telemetry    Sinus rhythm 60-70s  - Personally Reviewed  ECG    N/A  today - Personally Reviewed  Physical Exam   GEN: No acute distress. Sitting in chair. Eating.  Neck: No JVD Cardiac: RRR, no murmurs, rubs, or gallops.  Respiratory: Clear to auscultation bilaterally. On room air.  GI: Soft, nontender MS: No leg edema Neuro:  Nonfocal  Psych: Normal affect   Labs    High Sensitivity Troponin:   Recent Labs  Lab 02/27/22 1438 02/27/22 2156 03/03/22 0816 03/03/22 1007  TROPONINIHS 26* 30* 26* 21*     Chemistry Recent Labs  Lab 02/27/22 1438 02/27/22 1512 02/28/22 0113 02/28/22 1203 03/03/22 0816 03/03/22 1007 03/04/22 0605 03/05/22 0100 03/06/22 0302  NA 143   < > 144   < > 138  --  135 132* 132*  K 3.7   < > 3.1*   < > 4.2  --  4.3 4.1 4.5  CL 116*   < > 113*   < > 107  --  101 101 101  CO2 <7*   < > 10*   < > 22  --  25 22 21*  GLUCOSE 120*   < > 99   < > 106*  --  118* 129* 97  BUN 97*   < > 88*   < > 36*  --  35* 38* 42*  CREATININE 6.34*   < > 5.11*   < > 1.65*  --  1.65* 1.56* 1.53*  CALCIUM 9.0   < > 8.8*   < > 7.8*  --  8.5* 8.4* 8.1*  MG  --    < > 2.2   < >  --  1.7 2.3 2.0  --   PROT 7.2  --   --   --  4.7*  --  5.7*  --   --   ALBUMIN 3.5  --  2.9*  --  2.2*  --  2.6*  --   --   AST 34  --   --   --  43*  --  43*  --   --   ALT 22  --   --   --  21  --  28  --   --   ALKPHOS 78  --   --   --  41  --  46  --   --   BILITOT 0.6  --   --   --  0.6  --  0.8  --   --   GFRNONAA 6*   < > 8*   < > 32*  --  32* 34* 35*  ANIONGAP NOT CALCULATED   < > 21*   < > 9  --  '9 9 10   '$ < > = values in this interval not displayed.    Lipids No results for input(s): "CHOL", "TRIG", "HDL", "LABVLDL", "LDLCALC", "CHOLHDL" in the last 168 hours.  Hematology Recent Labs  Lab 03/04/22 0605 03/05/22 0100 03/06/22 0302  WBC 34.0* 35.4* 25.3*  RBC 3.19* 2.98* 2.89*  HGB 9.0* 8.4* 8.3*  HCT 27.3* 25.1* 24.2*  MCV 85.6 84.2 83.7  MCH 28.2 28.2 28.7  MCHC 33.0 33.5 34.3  RDW 17.0* 16.4* 16.2*  PLT 204 201 125*   Thyroid  Recent  Labs  Lab 03/03/22 0816  TSH 0.429    BNP Recent Labs  Lab 02/27/22 1659  BNP 157.6*    DDimer No results for input(s): "DDIMER" in the last 168 hours.   Radiology    DG CHEST PORT 1 VIEW  Result Date: 03/04/2022 CLINICAL DATA:  Acute respiratory failure. EXAM: PORTABLE CHEST 1 VIEW COMPARISON:  02/27/2022 FINDINGS: Slightly improved cardiac contours. Redemonstrated calcified right mediastinal and hilar lymph nodes, likely sequela of prior granulomatous disease. No focal pulmonary opacity, pleural effusion, or pneumothorax. Surgical clips in the right axilla. No acute osseous abnormality. IMPRESSION: No acute cardiopulmonary process. Electronically Signed   By: Merilyn Baba M.D.   On: 03/04/2022 11:50    Cardiac Studies   Echo from 02/28/22:    1. Left ventricular ejection fraction, by estimation, is 55%. The left  ventricle has normal function. The left ventricle has no regional wall  motion abnormalities. Left ventricular diastolic parameters are consistent  with Grade I diastolic dysfunction  (impaired relaxation).   2. Peak RV-RA gradient 25 mmHg. Right ventricular systolic function is  normal. The right ventricular size is normal.   3. The mitral valve is normal in structure. No evidence of mitral valve  regurgitation. No evidence of mitral stenosis.   4. The aortic valve is tricuspid. There is mild calcification of the  aortic valve.  Aortic valve regurgitation is not visualized. No aortic  stenosis is present.      Echo from 04/04/21:    1. EF improved since echo done 02/10/20 . Left ventricular ejection  fraction, by estimation, is 55%. The left ventricle has normal function.  The left ventricle has no regional wall motion abnormalities. Left  ventricular diastolic parameters were normal.  The average left ventricular global longitudinal strain is -21.7 %. The  global longitudinal strain is normal.   2. Right ventricular systolic function is normal. The right  ventricular  size is normal. There is moderately elevated pulmonary artery systolic  pressure.   3. Left atrial size was mildly dilated.   4. The mitral valve is normal in structure. Trivial mitral valve  regurgitation. No evidence of mitral stenosis.   5. Tricuspid valve regurgitation is moderate.   6. The aortic valve is tricuspid. Aortic valve regurgitation is not  visualized. No aortic stenosis is present.   7. The inferior vena cava is normal in size with greater than 50%  respiratory variability, suggesting right atrial pressure of 3 mmHg.      Cardiac MRI from 08/10/17: IMPRESSION: 1.  Normal LV size with EF 49%, diffuse hypokinesis.   2.  Mildly dilated RV with normal systolic function.   3. No myocardial LGE, so no definitive evidence for prior MI, infiltrative disease, or myocarditis.   Right and left heart cath 07/10/17:   1. Mildly elevated PCWP, mild pulmonary hypertension.  2. Preserved cardiac output.  3. No significant coronary disease, nonischemic cardiomyopathy.      Patient Profile     Tara [name] is a 76 y.o. female with a PMH of anxiety with depression, breast cancer s/p right lumpectomy and chemoradiation 2008 and recurrence with right mastectomy and chemo 2019, hypertension, hyperlipidemia, meningioma, systolic heart failure/nonischemic cardiomyopathy with recovered EF 03/2021, who presented to the ER 02/27/2022 with multiple complaints including slurred speech, confusion, shortness of breath, functional decline, decreased energy, admitted with AKI 2/2 pre-renal azotemia, leukocytosis of unclear etiology,increasing meningioma, cardiology is consulted on 03/03/2022 for the evaluation of A-fib with RVR and hypotension.   Assessment & Plan    New onset of A fib with RVR 03/03/22, paroxysmal  Hx of short PR SVT (remotely) Hypotension, resolved  - new onset 6-7am on 03/03/22 per telemetry review - she was asymptomatic  - TSH WNL, Mag >2 and K >4 at goal  - Echo  from 02/28/22 with stable LVEF 55% , no RWMA, grade I DD, normal RV, no significant valvular disease  - BP low initially limiting rate controlling agents, she refused DCCV,  converted to SR with IV amiodarone gtt and now transitioned to PO amiodarone '400mg'$  BID x7 days and then '200mg'$  daily now, remains in sinus today  - CHA2DS2VASc 7, anticoagulation transitioned from heparin gtt to Eliquis, renally dosed, monitor closely for bleeding with existing meningoma    Chronic systolic heart failure Non-ischemic cardiomyopathy - presented with weakness, fatigue, slurred speech, SOB in the setting of AKI  - LVEF has recovered since 04/04/21 Echo to 55%  - right and left heart cath 2018 no CAD - cardiac MRI 2018 no LGE - clinically euvolemic, historically not on routine diuretics, AKI etiology is not consistent with CRS, but pre-renal azotemia in the setting of increased NSAID use, renal index improving with IVF and holding nephrotoxic agents  - GDMT:  BP stable now, renal index within her baseline now, will resume Metoprolol XL '50mg'$  daily today (home  dosing reduced from '50mg'$  BID due to HR 60s) , also resume Entresto 24/26 BID today, may further resume home meds spironolactone/Jardiance if renal function stable    AKI on CKD IIIb, resolved   Meningioma Anemia Leukocytosis HTN HLD Pre-diabetes Depression/anxiety Debility  Hx of breast cancer - per hospitalist     For questions or updates, please contact [place] Please consult [email or web address] for contact info under        Signed, Margie Billet, NP  03/06/2022, 9:32 AM    Personally seen and examined. Agree with APP above with the following comments: Patient notes no symptoms.  Is doing well and is eager to go home. Husband is wondering when she will be released.  Exam notable for RRR, CTAB.  Otherwise as above Tele: SR; no further AFib Would recommend  - we have returned entresto today; we will attempt to increase her GDMT back to baseline  (Jardiance and aldactone either tomorrow or as outpatient) Stable from cardiac perspective.  Rudean Haskell, MD Cardiologist [facility], [address] [place], [place] [address] [phone]  10:53 AM

## 2022-03-06 NOTE — Progress Notes (Addendum)
PROGRESS NOTE        PATIENT DETAILS Name: Tara [name] Age: 76 y.o. Sex: female Date of Birth: [birth year] Admit Date: 02/27/2022 Admitting Physician Julian Hy, DO CVE:[id number], Norwood Levo, MD  Brief Summary: Patient is a 76 y.o.  female with a history of HTN, HLD, CKD stage IIIb, chronic HFpEF, meningioma, breast CA-s/p right lumpectomy and chemoradiation in 2008, depression/anxiety who presented with weakness/fatigue/confusion/slurred speech-she was found to have AKI-and initially admitted to the ICU for possible RRT.  However with supportive care-renal function improved-she [facility].  Further hospital course was complicated by development of A-fib RVR along with hypotension-necessitating transfer to the ICU again.  She was started on amiodarone infusion-she converted to sinus rhythm and was transferred back to [facility] on 6/14.  Significant events: 6/08>> presented to ED with weakness/fatigue/confusion/slurred sleep-AKI-meningioma.  Admitted by PCCM to ICU 6/11>> transfer to [facility] 6/12>> A-fib RVR with hypotension.  Amiodarone infusion started.  Transferred to ICU-subsequently converted to sinus rhythm. 6/14>> transfer to [facility].  Significant studies: 6/08>> CT head: Interval increase in size of previously noted planum sphenoidal meningioma. 6/08>> CT C-spine: No fracture or dislocation 6/08>> CXR: No cardiopulmonary disease. 6/08>> renal ultrasound: No hydronephrosis. 6/09>> Echo: EF 55% 6/12>> TSH: 0.429 6/13>> CXR: No PNA  Significant microbiology data: 6/08>> influenza/COVID PCR: Negative 6/13>> blood culture: Negative  Procedures: None  Consults: Nephrology, PCCM, cardiology  Subjective: Awake/alert-had BM yesterday.  Objective: Vitals: Blood pressure 132/61, pulse 64, temperature 98.2 F (36.8 C), temperature source Oral, resp. rate 20, height '4\' 9"'$  (1.448 m), weight 86.4 kg, SpO2 91  %.   Exam: Gen Exam:Alert awake-not in any distress HEENT:atraumatic, normocephalic Chest: B/L clear to auscultation anteriorly CVS:S1S2 regular Abdomen:soft non tender, non distended Extremities:no edema Neurology: Non focal Skin: no rash   Pertinent Labs/Radiology:    Latest Ref Rng & Units 03/06/2022    3:02 AM 03/05/2022    1:00 AM 03/04/2022    6:05 AM  CBC  WBC 4.0 - 10.5 K/uL 25.3  35.4  34.0   Hemoglobin 12.0 - 15.0 g/dL 8.3  8.4  9.0   Hematocrit 36.0 - 46.0 % 24.2  25.1  27.3   Platelets 150 - 400 K/uL 125  201  204     Lab Results  Component Value Date   NA 132 (L) 03/06/2022   K 4.5 03/06/2022   CL 101 03/06/2022   CO2 21 (L) 03/06/2022      Assessment/Plan: AKI on CKD stage IIIb: AKI hemodynamically mediated in the setting of hypotension/Entresto/Aldactone/Jardiance and NSAID use.  Renal function has improved significantly and now close to baseline.  Follow electrolytes periodically.     A-fib with RVR hypotension: Maintaining sinus rhythm-BP stable-on oral amiodarone load.  Metoprolol cardiology today.  No longer on IV heparin-has been transitioned to Eliquis. CHA2DS2-VASc score of around 6.  Cardiology following.  Meningioma possible vasogenic edema: Slurred speech has resolved-Decadron.  Discussed with neurosurgeon-Dr. Dawley-recommendations are to pursue possible craniotomy/resection in the outpatient setting-recommends that taper Decadron to off in 2 weeks.  Normocytic anemia: Due to CKD-worsened due to AKI and IVF administration.  No indications for transfusion-watch closely.  Leukocytosis: Suspect due to steroids-UA/chest x-ray/blood cultures negative for infection.  Will started on empiric cefepime while in the ICU-since no evidence of infection-we will discontinue and monitor  off antimicrobial therapy.  Chronic HFpEF: Volume status stable-cardiology restarting Entresto and metoprolol today.  HTN: BP stable-not on any antihypertensives  currently.  HLD: On Crestor.  Prediabetes (A1c 6.3 on 6/8)-with steroid-induced hyperglycemia: CBG stable on SSI and 10 units of Semglee  Recent Labs    03/06/22 0417 03/06/22 0734 03/06/22 1120  GLUCAP 99 105* 181*     Depression/anxiety: Continue Zoloft/Klonopin.  GERD: Continue PPI  Constipation: Had small BM with senna/MiraLAX-follow for now.  Morbid Obesity: Estimated body mass index is 41.22 kg/m as calculated from the following:   Height as of this encounter: '4\' 9"'$  (1.448 m).   Weight as of this encounter: 86.4 kg.   Code status:   Code Status: Full Code   DVT Prophylaxis: apixaban (ELIQUIS) tablet 2.5 mg Start: 03/04/22 2200 SCDs Start: 02/27/22 2010 apixaban (ELIQUIS) tablet 2.5 mg   Family Communication: Spouse at bedside   Disposition Plan: Status is: Inpatient Remains inpatient appropriate because: Decreasing leukocytosis-CHF meds being restarted-SNF hopefully in the next 1-2 days.   Planned Discharge Destination:Skilled nursing facility  The patient is critically ill with multiple organ system failure and requires high complexity decision making for assessment and support, frequent evaluation and titration of therapies, advanced monitoring, review of radiographic studies and interpretation of complex data.     Diet: Diet Order             Diet heart healthy/carb modified Room service appropriate? Yes; Fluid consistency: Thin  Diet effective now                     Antimicrobial agents: Anti-infectives (From admission, onward)    Start     Dose/Rate Route Frequency Ordered Stop   03/04/22 1115  ceFEPIme (MAXIPIME) 2 g in sodium chloride 0.9 % 100 mL IVPB        2 g 200 mL/hr over 30 Minutes Intravenous Every 24 hours 03/04/22 1021          MEDICATIONS: Scheduled Meds:  [START ON 03/11/2022] amiodarone  200 mg Oral Daily   amiodarone  400 mg Oral BID   apixaban  2.5 mg Oral BID   Chlorhexidine Gluconate Cloth  6 each Topical Q0600    clonazePAM  0.5 mg Oral QHS   dexamethasone (DECADRON) injection  4 mg Intravenous Q6H   insulin aspart  0-9 Units Subcutaneous Q4H   insulin glargine-yfgn  10 Units Subcutaneous QHS   lidocaine  1 patch Transdermal Q24H   metoprolol succinate  50 mg Oral Daily   montelukast  10 mg Oral QHS   pantoprazole  40 mg Oral Q1200   polyethylene glycol  17 g Oral BID   rosuvastatin  10 mg Oral Daily   sacubitril-valsartan  1 tablet Oral BID   senna  2 tablet Oral Daily   sertraline  25 mg Oral Daily   Continuous Infusions:  sodium chloride Stopped (03/04/22 1818)   ceFEPime (MAXIPIME) IV 2 g (03/06/22 1238)   lactated ringers     PRN Meds:.sodium chloride, acetaminophen, bisacodyl, docusate sodium, melatonin, ondansetron (ZOFRAN) IV   I have personally reviewed following labs and imaging studies  LABORATORY DATA: CBC: Recent Labs  Lab 02/27/22 1438 02/27/22 1512 03/02/22 0246 03/03/22 0816 03/04/22 0605 03/05/22 0100 03/06/22 0302  WBC 20.6*   < > 22.5* 23.6* 34.0* 35.4* 25.3*  NEUTROABS 17.6*  --   --   --   --   --   --   HGB 10.5*   < >  7.7* 7.9* 9.0* 8.4* 8.3*  HCT 33.1*   < > 22.9* 23.3* 27.3* 25.1* 24.2*  MCV 87.6   < > 81.2 84.1 85.6 84.2 83.7  PLT 259   < > 201 176 204 201 125*   < > = values in this interval not displayed.     Basic Metabolic Panel: Recent Labs  Lab 02/27/22 2156 02/28/22 0113 02/28/22 1203 02/28/22 1907 02/28/22 2331 03/01/22 0550 03/02/22 0246 03/03/22 0816 03/03/22 1007 03/04/22 0605 03/05/22 0100 03/06/22 0302  NA 145 144   < > 137 137 136 139 138  --  135 132* 132*  K 3.5 3.1*   < > 2.5* 2.8* 3.7 4.2 4.2  --  4.3 4.1 4.5  CL 117* 113*   < > 102 102 98 102 107  --  101 101 101  CO2 8* 10*   < > 20* '23 26 26 22  '$ --  25 22 21*  GLUCOSE 100* 99   < > 171* 132* 122* 136* 106*  --  118* 129* 97  BUN 95* 88*   < > 69* 65* 60* 49* 36*  --  35* 38* 42*  CREATININE 5.71* 5.11*   < > 3.47* 3.26* 2.92* 2.40* 1.65*  --  1.65* 1.56* 1.53*   CALCIUM 8.9 8.8*   < > 7.9* 7.7* 7.5* 8.0* 7.8*  --  8.5* 8.4* 8.1*  MG 2.4 2.2  --  1.7 2.6* 2.5*  --   --  1.7 2.3 2.0  --   PHOS 7.4* 6.2*  --  3.0  --  2.5  --   --   --   --  3.6  --    < > = values in this interval not displayed.     GFR: Estimated Creatinine Clearance: 28.5 mL/min (A) (by C-G formula based on SCr of 1.53 mg/dL (H)).  Liver Function Tests: Recent Labs  Lab 02/27/22 1438 02/28/22 0113 03/03/22 0816 03/04/22 0605  AST 34  --  43* 43*  ALT 22  --  21 28  ALKPHOS 78  --  41 46  BILITOT 0.6  --  0.6 0.8  PROT 7.2  --  4.7* 5.7*  ALBUMIN 3.5 2.9* 2.2* 2.6*    Recent Labs  Lab 02/27/22 2156  LIPASE 67*    No results for input(s): "AMMONIA" in the last 168 hours.  Coagulation Profile: Recent Labs  Lab 02/27/22 1438  INR 1.1     Cardiac Enzymes: Recent Labs  Lab 02/27/22 2156  CKTOTAL 1,062*     BNP (last 3 results) No results for input(s): "PROBNP" in the last 8760 hours.  Lipid Profile: No results for input(s): "CHOL", "HDL", "LDLCALC", "TRIG", "CHOLHDL", "LDLDIRECT" in the last 72 hours.  Thyroid Function Tests: No results for input(s): "TSH", "T4TOTAL", "FREET4", "T3FREE", "THYROIDAB" in the last 72 hours.   Anemia Panel: No results for input(s): "VITAMINB12", "FOLATE", "FERRITIN", "TIBC", "IRON", "RETICCTPCT" in the last 72 hours.  Urine analysis:    Component Value Date/Time   COLORURINE YELLOW 03/04/2022 1850   APPEARANCEUR HAZY (A) 03/04/2022 1850   LABSPEC 1.021 03/04/2022 1850   PHURINE 5.0 03/04/2022 1850   GLUCOSEU >=500 (A) 03/04/2022 1850   HGBUR MODERATE (A) 03/04/2022 1850   BILIRUBINUR NEGATIVE 03/04/2022 1850   KETONESUR NEGATIVE 03/04/2022 1850   PROTEINUR 30 (A) 03/04/2022 1850   UROBILINOGEN 0.2 02/07/2013 1859   NITRITE NEGATIVE 03/04/2022 1850   LEUKOCYTESUR NEGATIVE 03/04/2022 1850    Sepsis Labs: Lactic  Acid, Venous    Component Value Date/Time   LATICACIDVEN 1.7 03/03/2022 1340     MICROBIOLOGY: Recent Results (from the past 240 hour(s))  Resp Panel by RT-PCR (Flu A&B, Covid) Anterior Nasal Swab     Status: None   Collection Time: 02/27/22  4:59 PM   Specimen: Anterior Nasal Swab  Result Value Ref Range Status   SARS Coronavirus 2 by RT PCR NEGATIVE NEGATIVE Final    Comment: (NOTE) SARS-CoV-2 target nucleic acids are NOT DETECTED.  The SARS-CoV-2 RNA is generally detectable in upper respiratory specimens during the acute phase of infection. The lowest concentration of SARS-CoV-2 viral copies this assay can detect is 138 copies/mL. A negative result does not preclude SARS-Cov-2 infection and should not be used as the sole basis for treatment or other patient management decisions. A negative result may occur with  improper specimen collection/handling, submission of specimen other than nasopharyngeal swab, presence of viral mutation(s) within the areas targeted by this assay, and inadequate number of viral copies(<138 copies/mL). A negative result must be combined with clinical observations, patient history, and epidemiological information. The expected result is Negative.  Fact Sheet for Patients:  EntrepreneurPulse.com.au  Fact Sheet for Healthcare Providers:  IncredibleEmployment.be  This test is no t yet approved or cleared by the [place] FDA and  has been authorized for detection and/or diagnosis of SARS-CoV-2 by FDA under an Emergency Use Authorization (EUA). This EUA will remain  in effect (meaning this test can be used) for the duration of the COVID-19 declaration under Section 564(b)(1) of the Act, 21 U.S.C.section 360bbb-3(b)(1), unless the authorization is terminated  or revoked sooner.       Influenza A by PCR NEGATIVE NEGATIVE Final   Influenza B by PCR NEGATIVE NEGATIVE Final    Comment: (NOTE) The Xpert Xpress SARS-CoV-2/FLU/RSV plus assay is intended as an aid in the diagnosis of influenza  from Nasopharyngeal swab specimens and should not be used as a sole basis for treatment. Nasal washings and aspirates are unacceptable for Xpert Xpress SARS-CoV-2/FLU/RSV testing.  Fact Sheet for Patients: EntrepreneurPulse.com.au  Fact Sheet for Healthcare Providers: IncredibleEmployment.be  This test is not yet approved or cleared by the [place] FDA and has been authorized for detection and/or diagnosis of SARS-CoV-2 by FDA under an Emergency Use Authorization (EUA). This EUA will remain in effect (meaning this test can be used) for the duration of the COVID-19 declaration under Section 564(b)(1) of the Act, 21 U.S.C. section 360bbb-3(b)(1), unless the authorization is terminated or revoked.  Performed at [facility], [place] [address], [place], [place] [address]   MRSA Next Gen by PCR, Nasal     Status: None   Collection Time: 02/27/22  9:30 PM   Specimen: Nasal Mucosa; Nasal Swab  Result Value Ref Range Status   MRSA by PCR Next Gen NOT DETECTED NOT DETECTED Final    Comment: (NOTE) The GeneXpert MRSA Assay (FDA approved for NASAL specimens only), is one component of a comprehensive MRSA colonization surveillance program. It is not intended to diagnose MRSA infection nor to guide or monitor treatment for MRSA infections. Test performance is not FDA approved in patients less than 83 years old. Performed at [facility], [place] [address], [place], [place] [address]   Culture, blood (Routine X 2) w Reflex to ID Panel     Status: None (Preliminary result)   Collection Time: 03/04/22 11:26 AM   Specimen: BLOOD  Result Value Ref Range Status  Specimen Description BLOOD BLOOD RIGHT FOREARM  Final   Special Requests   Final    BOTTLES DRAWN AEROBIC ONLY Blood Culture results may not be optimal due to an inadequate volume of blood received in culture bottles    Culture   Final    NO GROWTH 2 DAYS Performed at [facility], [place] [address], [place], [place] [address]    Report Status PENDING  Incomplete  Culture, blood (Routine X 2) w Reflex to ID Panel     Status: None (Preliminary result)   Collection Time: 03/04/22 11:37 AM   Specimen: BLOOD  Result Value Ref Range Status   Specimen Description BLOOD BLOOD LEFT FOREARM  Final   Special Requests   Final    BOTTLES DRAWN AEROBIC ONLY Blood Culture results may not be optimal due to an inadequate volume of blood received in culture bottles    Culture   Final    NO GROWTH 2 DAYS Performed at [facility], [place] [address] [place], [place] [address]    Report Status PENDING  Incomplete  Urine Culture     Status: Abnormal   Collection Time: 03/04/22  6:50 PM   Specimen: Urine, Clean Catch  Result Value Ref Range Status   Specimen Description URINE, CLEAN CATCH  Final   Special Requests NONE  Final   Culture (A)  Final    <10,000 COLONIES/mL INSIGNIFICANT GROWTH Performed at [facility], [place] [address], [place], [place] [address]    Report Status 03/05/2022 FINAL  Final    RADIOLOGY STUDIES/RESULTS: No results found.   LOS: 7 days   Oren Binet, MD  [facility]    To contact the attending provider between 7A-7P or the covering provider during after hours 7P-7A, please log into the web site [email or web address] and access using universal [place] password for that web site. If you do not have the password, please call the hospital operator.  03/06/2022, 1:54 PM

## 2022-03-07 DIAGNOSIS — I5022 Chronic systolic (congestive) heart failure: Secondary | ICD-10-CM | POA: Diagnosis not present

## 2022-03-07 DIAGNOSIS — N179 Acute kidney failure, unspecified: Secondary | ICD-10-CM | POA: Diagnosis not present

## 2022-03-07 DIAGNOSIS — I4891 Unspecified atrial fibrillation: Secondary | ICD-10-CM | POA: Diagnosis not present

## 2022-03-07 LAB — GLUCOSE, CAPILLARY
Glucose-Capillary: 110 mg/dL — ABNORMAL HIGH (ref 70–99)
Glucose-Capillary: 114 mg/dL — ABNORMAL HIGH (ref 70–99)
Glucose-Capillary: 143 mg/dL — ABNORMAL HIGH (ref 70–99)
Glucose-Capillary: 164 mg/dL — ABNORMAL HIGH (ref 70–99)
Glucose-Capillary: 85 mg/dL (ref 70–99)
Glucose-Capillary: 89 mg/dL (ref 70–99)

## 2022-03-07 LAB — CBC
HCT: 25.8 % — ABNORMAL LOW (ref 36.0–46.0)
Hemoglobin: 8.3 g/dL — ABNORMAL LOW (ref 12.0–15.0)
MCH: 27.5 pg (ref 26.0–34.0)
MCHC: 32.2 g/dL (ref 30.0–36.0)
MCV: 85.4 fL (ref 80.0–100.0)
Platelets: 227 10*3/uL (ref 150–400)
RBC: 3.02 MIL/uL — ABNORMAL LOW (ref 3.87–5.11)
RDW: 16 % — ABNORMAL HIGH (ref 11.5–15.5)
WBC: 25.7 10*3/uL — ABNORMAL HIGH (ref 4.0–10.5)
nRBC: 0.1 % (ref 0.0–0.2)

## 2022-03-07 LAB — BASIC METABOLIC PANEL
Anion gap: 7 (ref 5–15)
BUN: 44 mg/dL — ABNORMAL HIGH (ref 8–23)
CO2: 23 mmol/L (ref 22–32)
Calcium: 8.6 mg/dL — ABNORMAL LOW (ref 8.9–10.3)
Chloride: 103 mmol/L (ref 98–111)
Creatinine, Ser: 1.74 mg/dL — ABNORMAL HIGH (ref 0.44–1.00)
GFR, Estimated: 30 mL/min — ABNORMAL LOW (ref >60)
Glucose, Bld: 91 mg/dL (ref 70–99)
Potassium: 4.4 mmol/L (ref 3.5–5.1)
Sodium: 133 mmol/L — ABNORMAL LOW (ref 135–145)

## 2022-03-07 MED ORDER — EMPAGLIFLOZIN 10 MG PO TABS
10.0000 mg | ORAL_TABLET | Freq: Every day | ORAL | Status: DC
  Administered 2022-03-07 – 2022-03-08 (×2): 10 mg via ORAL
  Filled 2022-03-07 (×2): qty 1

## 2022-03-07 NOTE — Progress Notes (Signed)
PROGRESS NOTE        PATIENT DETAILS Name: Tara [name] Age: 76 y.o. Sex: female Date of Birth: 03/21/1946 Admit Date: 02/27/2022 Admitting Physician Julian Hy, DO FYB:[id number], Norwood Levo, MD  Brief Summary: Patient is a 76 y.o.  female with a history of HTN, HLD, CKD stage IIIb, chronic HFpEF, meningioma, breast CA-s/p right lumpectomy and chemoradiation in 2008, depression/anxiety who presented with weakness/fatigue/confusion/slurred speech-she was found to have AKI-and initially admitted to the ICU for possible RRT.  However with supportive care-renal function improved-she was subsequently transferred to [facility] on 6/11.  Further hospital course was complicated by development of A-fib RVR along with hypotension-necessitating transfer to the ICU again.  She was started on amiodarone infusion-she converted to sinus rhythm and was transferred back to [facility] on 6/14.  Significant events: 6/08>> presented to ED with weakness/fatigue/confusion/slurred sleep-AKI-meningioma.  Admitted by PCCM to ICU 6/11>> transfer to [facility] 6/12>> A-fib RVR with hypotension.  Amiodarone infusion started.  Transferred to ICU-subsequently converted to sinus rhythm. 6/14>> transfer to [facility].  Significant studies: 6/08>> CT head: Interval increase in size of previously noted planum sphenoidal meningioma. 6/08>> CT C-spine: No fracture or dislocation 6/08>> CXR: No cardiopulmonary disease. 6/08>> renal ultrasound: No hydronephrosis. 6/09>> Echo: EF 55% 6/12>> TSH: 0.429 6/13>> CXR: No PNA  Significant microbiology data: 6/08>> influenza/COVID PCR: Negative 6/13>> blood culture: Negative  Procedures: None  Consults: Nephrology, PCCM, cardiology  Subjective:  Patient in bed, appears comfortable, denies any headache, no fever, no chest pain or pressure, no shortness of breath , no abdominal pain. No new focal weakness.   Objective: Vitals: Blood pressure (!)  132/52, pulse (!) 58, temperature 98.5 F (36.9 C), temperature source Axillary, resp. rate 17, height '4\' 9"'$  (1.448 m), weight 86.2 kg, SpO2 96 %.   Exam:  Awake Alert, No new F.N deficits, Normal affect [place].AT,PERRAL Supple Neck, No JVD,   Symmetrical Chest wall movement, Good air movement bilaterally, CTAB RRR,No Gallops, Rubs or new Murmurs,  +ve B.Sounds, Abd Soft, No tenderness,   No Cyanosis, Clubbing or edema     Assessment/Plan:  AKI on CKD stage IIIb  Creatinine 1.7: AKI hemodynamically mediated in the setting of hypotension/Entresto/Aldactone/Jardiance and NSAID use.  Renal function has improved significantly and now close to baseline. Monitor her renal function on Entresto being resumed.  A-fib with RVR hypotension: Maintaining sinus rhythm-BP stable-on oral amiodarone load.  Metoprolol cardiology today.  No longer on IV heparin-has been transitioned to Eliquis. CHA2DS2-VASc score of around 6.  Cardiology following.  Meningioma possible vasogenic edema: Slurred speech has resolved-Decadron.  Discussed with neurosurgeon-Dr. Dawley-recommendations are to pursue possible craniotomy/resection in the outpatient setting-recommends that taper Decadron to off in 2 weeks.  Weakness and deconditioning.  Does not want to be placed in a rehab wants to go home, will have PT reevaluate if stable likely discharge on 03/08/2022.    Normocytic anemia: Due to CKD-worsened due to AKI and IVF administration.  No indications for transfusion-watch closely.  Leukocytosis: Suspect due to steroids-UA/chest x-ray/blood cultures negative for infection.  Will started on empiric cefepime while in the ICU-since no evidence of infection-we will discontinue and monitor off antimicrobial therapy.  Chronic HFpEF: Volume status stable-cardiology restarting Entresto and metoprolol .  HTN: BP stable-not on any antihypertensives currently.  HLD: On Crestor.  Prediabetes (A1c 6.3 on 6/8)-with steroid-induced  hyperglycemia: CBG stable on SSI and 10 units of Semglee  Recent Labs    03/06/22 2336 03/07/22 0426 03/07/22 0730  GLUCAP 101* 110* 89    Depression/anxiety: Continue Zoloft/Klonopin.  GERD: Continue PPI  Constipation: Had small BM with senna/MiraLAX-follow for now.  Morbid Obesity: Estimated body mass index is 41.12 kg/m as calculated from the following:   Height as of this encounter: '4\' 9"'$  (1.448 m).   Weight as of this encounter: 86.2 kg.   Code status:   Code Status: Full Code   DVT Prophylaxis:  apixaban (ELIQUIS) tablet 2.5 mg Start: 03/04/22 2200 SCDs Start: 02/27/22 2010 apixaban (ELIQUIS) tablet 2.5 mg    Family Communication: Spouse at bedside on 03/07/2022   Disposition Plan: Status is: Inpatient Remains inpatient appropriate because: Decreasing leukocytosis-CHF meds being restarted-SNF hopefully in the next 1-2 days.   Planned Discharge Destination:Skilled nursing facility  The patient is critically ill with multiple organ system failure and requires high complexity decision making for assessment and support, frequent evaluation and titration of therapies, advanced monitoring, review of radiographic studies and interpretation of complex data.     Diet: Diet Order             Diet heart healthy/carb modified Room service appropriate? Yes with Assist; Fluid consistency: Thin  Diet effective now                     Antimicrobial agents: Anti-infectives (From admission, onward)    Start     Dose/Rate Route Frequency Ordered Stop   03/04/22 1115  ceFEPIme (MAXIPIME) 2 g in sodium chloride 0.9 % 100 mL IVPB  Status:  Discontinued        2 g 200 mL/hr over 30 Minutes Intravenous Every 24 hours 03/04/22 1021 03/06/22 1358        MEDICATIONS: Scheduled Meds:  [START ON 03/11/2022] amiodarone  200 mg Oral Daily   amiodarone  400 mg Oral BID   apixaban  2.5 mg Oral BID   clonazePAM  0.5 mg Oral QHS   dexamethasone  2 mg Oral Q8H   insulin  aspart  0-9 Units Subcutaneous Q4H   insulin glargine-yfgn  10 Units Subcutaneous QHS   lidocaine  1 patch Transdermal Q24H   metoprolol succinate  50 mg Oral Daily   montelukast  10 mg Oral QHS   pantoprazole  40 mg Oral Q1200   polyethylene glycol  17 g Oral BID   rosuvastatin  10 mg Oral Daily   sacubitril-valsartan  1 tablet Oral BID   senna  2 tablet Oral Daily   sertraline  25 mg Oral Daily   Continuous Infusions:  sodium chloride Stopped (03/04/22 1818)   PRN Meds:.sodium chloride, acetaminophen, bisacodyl, docusate sodium, melatonin, ondansetron (ZOFRAN) IV   I have personally reviewed following labs and imaging studies  LABORATORY DATA:  Recent Labs  Lab 03/03/22 0816 03/04/22 0605 03/05/22 0100 03/06/22 0302 03/07/22 0211  WBC 23.6* 34.0* 35.4* 25.3* 25.7*  HGB 7.9* 9.0* 8.4* 8.3* 8.3*  HCT 23.3* 27.3* 25.1* 24.2* 25.8*  PLT 176 204 201 125* 227  MCV 84.1 85.6 84.2 83.7 85.4  MCH 28.5 28.2 28.2 28.7 27.5  MCHC 33.9 33.0 33.5 34.3 32.2  RDW 17.2* 17.0* 16.4* 16.2* 16.0*    Recent Labs  Lab 02/28/22 1907 02/28/22 2331 03/01/22 0550 03/02/22 0246 03/03/22 0816 03/03/22 1007 03/03/22 1054 03/03/22 1340 03/04/22 0605 03/05/22 0100 03/06/22 0302 03/07/22 0211  NA 137 137 136   < >  138  --   --   --  135 132* 132* 133*  K 2.5* 2.8* 3.7   < > 4.2  --   --   --  4.3 4.1 4.5 4.4  CL 102 102 98   < > 107  --   --   --  101 101 101 103  CO2 20* 23 26   < > 22  --   --   --  25 22 21* 23  GLUCOSE 171* 132* 122*   < > 106*  --   --   --  118* 129* 97 91  BUN 69* 65* 60*   < > 36*  --   --   --  35* 38* 42* 44*  CREATININE 3.47* 3.26* 2.92*   < > 1.65*  --   --   --  1.65* 1.56* 1.53* 1.74*  CALCIUM 7.9* 7.7* 7.5*   < > 7.8*  --   --   --  8.5* 8.4* 8.1* 8.6*  AST  --   --   --   --  43*  --   --   --  43*  --   --   --   ALT  --   --   --   --  21  --   --   --  28  --   --   --   ALKPHOS  --   --   --   --  41  --   --   --  46  --   --   --   BILITOT  --    --   --   --  0.6  --   --   --  0.8  --   --   --   ALBUMIN  --   --   --   --  2.2*  --   --   --  2.6*  --   --   --   MG 1.7 2.6* 2.5*  --   --  1.7  --   --  2.3 2.0  --   --   PHOS 3.0  --  2.5  --   --   --   --   --   --  3.6  --   --   LATICACIDVEN  --   --   --   --   --   --  1.9 1.7  --   --   --   --   TSH  --   --   --   --  0.429  --   --   --   --   --   --   --    < > = values in this interval not displayed.    RADIOLOGY STUDIES/RESULTS: No results found.   LOS: 8 days   Signature  Lala Lund M.D on 03/07/2022 at 10:07 AM   -  To page go to [email or web address]

## 2022-03-07 NOTE — Progress Notes (Signed)
[facility] Progress note  Progress Note  Patient Name: Tara [name] Date of Encounter: 03/07/2022  [facility] Cardiologist: Dr. Aundra Dubin  Subjective   Husband notes that she has not been toileting her self and is worried she needs rehab. NO CP, SOB, palpitations.  Inpatient Medications    Scheduled Meds:  [START ON 03/11/2022] amiodarone  200 mg Oral Daily   amiodarone  400 mg Oral BID   apixaban  2.5 mg Oral BID   clonazePAM  0.5 mg Oral QHS   dexamethasone  2 mg Oral Q8H   insulin aspart  0-9 Units Subcutaneous Q4H   insulin glargine-yfgn  10 Units Subcutaneous QHS   lidocaine  1 patch Transdermal Q24H   metoprolol succinate  50 mg Oral Daily   montelukast  10 mg Oral QHS   pantoprazole  40 mg Oral Q1200   polyethylene glycol  17 g Oral BID   rosuvastatin  10 mg Oral Daily   sacubitril-valsartan  1 tablet Oral BID   senna  2 tablet Oral Daily   sertraline  25 mg Oral Daily   Continuous Infusions:  sodium chloride Stopped (03/04/22 1818)   PRN Meds: sodium chloride, acetaminophen, bisacodyl, docusate sodium, melatonin, ondansetron (ZOFRAN) IV   Vital Signs    Vitals:   03/06/22 1123 03/06/22 1953 03/07/22 0432 03/07/22 0854  BP: 132/61 (!) 119/59 (!) 110/57 (!) 132/52  Pulse: 64 (!) 57 (!) 55 (!) 58  Resp: 20 (!) 22 17   Temp: 98.2 F (36.8 C) 98.4 F (36.9 C) 98.5 F (36.9 C)   TempSrc: Oral Axillary Axillary   SpO2: 91%  96%   Weight:   86.2 kg   Height:        Intake/Output Summary (Last 24 hours) at 03/07/2022 1058 Last data filed at 03/07/2022 5277 Gross per 24 hour  Intake 600 ml  Output 300 ml  Net 300 ml      03/07/2022    4:32 AM 03/06/2022    4:17 AM 03/05/2022    2:17 PM  Last 3 Weights  Weight (lbs) 190 lb 0.6 oz 190 lb 7.6 oz 190 lb 4.1 oz  Weight (kg) 86.2 kg 86.4 kg 86.3 kg      Telemetry    SR sinus bradycardia - Personally Reviewed  Physical Exam   GEN: No acute distress, morbid obesity Neck: No JVD Cardiac: regular  bradycardia, no murmurs, rubs, or gallops.  Respiratory: Clear to auscultation bilaterally. On room air.  GI: Soft, nontender MS: No leg edema Neuro:  Nonfocal  Psych: Normal affect   Labs    High Sensitivity Troponin:   Recent Labs  Lab 02/27/22 1438 02/27/22 2156 03/03/22 0816 03/03/22 1007  TROPONINIHS 26* 30* 26* 21*     Chemistry Recent Labs  Lab 03/03/22 0816 03/03/22 1007 03/04/22 0605 03/05/22 0100 03/06/22 0302 03/07/22 0211  NA 138  --  135 132* 132* 133*  K 4.2  --  4.3 4.1 4.5 4.4  CL 107  --  101 101 101 103  CO2 22  --  25 22 21* 23  GLUCOSE 106*  --  118* 129* 97 91  BUN 36*  --  35* 38* 42* 44*  CREATININE 1.65*  --  1.65* 1.56* 1.53* 1.74*  CALCIUM 7.8*  --  8.5* 8.4* 8.1* 8.6*  MG  --  1.7 2.3 2.0  --   --   PROT 4.7*  --  5.7*  --   --   --  ALBUMIN 2.2*  --  2.6*  --   --   --   AST 43*  --  43*  --   --   --   ALT 21  --  28  --   --   --   ALKPHOS 41  --  46  --   --   --   BILITOT 0.6  --  0.8  --   --   --   GFRNONAA 32*  --  32* 34* 35* 30*  ANIONGAP 9  --  '9 9 10 7    '$ Lipids No results for input(s): "CHOL", "TRIG", "HDL", "LABVLDL", "LDLCALC", "CHOLHDL" in the last 168 hours.  Hematology Recent Labs  Lab 03/05/22 0100 03/06/22 0302 03/07/22 0211  WBC 35.4* 25.3* 25.7*  RBC 2.98* 2.89* 3.02*  HGB 8.4* 8.3* 8.3*  HCT 25.1* 24.2* 25.8*  MCV 84.2 83.7 85.4  MCH 28.2 28.7 27.5  MCHC 33.5 34.3 32.2  RDW 16.4* 16.2* 16.0*  PLT 201 125* 227   Thyroid  Recent Labs  Lab 03/03/22 0816  TSH 0.429    BNP No results for input(s): "BNP", "PROBNP" in the last 168 hours.   DDimer No results for input(s): "DDIMER" in the last 168 hours.   Radiology    No results found.  Cardiac Studies   Echo from 02/28/22:    1. Left ventricular ejection fraction, by estimation, is 55%. The left  ventricle has normal function. The left ventricle has no regional wall  motion abnormalities. Left ventricular diastolic parameters are consistent   with Grade I diastolic dysfunction  (impaired relaxation).   2. Peak RV-RA gradient 25 mmHg. Right ventricular systolic function is  normal. The right ventricular size is normal.   3. The mitral valve is normal in structure. No evidence of mitral valve  regurgitation. No evidence of mitral stenosis.   4. The aortic valve is tricuspid. There is mild calcification of the  aortic valve. Aortic valve regurgitation is not visualized. No aortic  stenosis is present.      Echo from 04/04/21:    1. EF improved since echo done 02/10/20 . Left ventricular ejection  fraction, by estimation, is 55%. The left ventricle has normal function.  The left ventricle has no regional wall motion abnormalities. Left  ventricular diastolic parameters were normal.  The average left ventricular global longitudinal strain is -21.7 %. The  global longitudinal strain is normal.   2. Right ventricular systolic function is normal. The right ventricular  size is normal. There is moderately elevated pulmonary artery systolic  pressure.   3. Left atrial size was mildly dilated.   4. The mitral valve is normal in structure. Trivial mitral valve  regurgitation. No evidence of mitral stenosis.   5. Tricuspid valve regurgitation is moderate.   6. The aortic valve is tricuspid. Aortic valve regurgitation is not  visualized. No aortic stenosis is present.   7. The inferior vena cava is normal in size with greater than 50%  respiratory variability, suggesting right atrial pressure of 3 mmHg.      Cardiac MRI from 08/10/17: IMPRESSION: 1.  Normal LV size with EF 49%, diffuse hypokinesis.   2.  Mildly dilated RV with normal systolic function.   3. No myocardial LGE, so no definitive evidence for prior MI, infiltrative disease, or myocarditis.   Right and left heart cath 07/10/17:   1. Mildly elevated PCWP, mild pulmonary hypertension.  2. Preserved cardiac output.  3. No significant  coronary disease, nonischemic  cardiomyopathy.      Patient Profile     Tara [name] is a 76 y.o. female with a PMH of anxiety with depression, breast cancer s/p right lumpectomy and chemoradiation 2008 and recurrence with right mastectomy and chemo 2019, hypertension, hyperlipidemia, meningioma, systolic heart failure/nonischemic cardiomyopathy with recovered EF 03/2021, who presented to the ER 02/27/2022 with multiple complaints including slurred speech, confusion, shortness of breath, functional decline, decreased energy, admitted with AKI 2/2 pre-renal azotemia, leukocytosis of unclear etiology,increasing meningioma, cardiology is consulted on 03/03/2022 for the evaluation of A-fib with RVR and hypotension.   Assessment & Plan    New onset of A fib with RVR 03/03/22, paroxysmal  Hx of short PR SVT (remotely) Hypotension, resolved  Anemia - BP low initially limiting rate controlling agents converted to SR with IV amiodarone gtt and now transitioned to PO amiodarone '400mg'$  BID x7 days and then '200mg'$  daily now, remains in sinus  - CHA2DS2VASc 7, anticoagulation transitioned from heparin gtt to Eliquis, renally dosed, monitor closely for bleeding with existing meningoma    Chronic systolic heart failure Non-ischemic cardiomyopathy Morbid Obesity HTN AKI on CKD IIIb - LVEF has recovered since 04/04/21 Echo to 55%  - right and left heart cath 2018 no CAD - cardiac MRI 2018 no LGE - GDMT:  BP stable now, renal index within her baseline now - continue  Metoprolol XL '50mg'$  daily today  - and Entresto 24/26 BID - will plan for outpatient resumption of spironolactone of renal function stable  - will add jardiance 10 mg today  Meningioma Leukocytosis HLD Pre-diabetes Depression/anxiety Debility  Hx of breast cancer - per hospitalist    We will work on outpatient follow up.  Likely sign off today  For questions or updates, please contact [place] Please consult [email or web address] for contact info under         Signed, Werner Lean, MD  03/07/2022, 10:58 AM

## 2022-03-07 NOTE — TOC Progression Note (Signed)
Transition of Care [facility]) - Progression Note    Patient Details  Name: Tara [name] MRN: [id number] Date of Birth: Feb 23, 1946  Transition of Care [facility]) CM/SW [place], [place] Phone Number: 03/07/2022, 4:21 PM  Clinical Narrative:     CSW initiated auth with [employer] to with a potential admission date to [place] on 03/09/22, ref # [id number], clinicals faxed.   Expected Discharge Plan: Skilled Nursing Facility Barriers to Discharge: Continued Medical Work up  Expected Discharge Plan and Services Expected Discharge Plan: [place] In-house Referral: Clinical Social Work Discharge Planning Services: CM Consult   Living arrangements for the past 2 months: Single Family Home                                       Social Determinants of Health (SDOH) Interventions    Readmission Risk Interventions    03/06/2022   12:36 PM  Readmission Risk Prevention Plan  Transportation Screening Complete  HRI or [place] Complete  Social Work Consult for [place] Planning/Counseling Complete

## 2022-03-07 NOTE — TOC Progression Note (Signed)
Transition of Care [facility]) - Progression Note    Patient Details  Name: Tara [name] MRN: [id number] Date of Birth: December 31, 1945  Transition of Care [facility]) CM/SW Contact  Graves-Bigelow, Ocie Cornfield, RN Phone Number: 03/07/2022, 10:49 AM  Clinical Narrative: Case Manager received notification that the patient wanted to return home with home health services. Case Manager spoke with spouse and patient. Patient is agreeable to SNF once stable. Spouse had concerns that the patient was still using the PureWick and not ambulating to the bathroom- he feels that she is still weak and he has safety concerns if she returns home. Per spouse patient has had several falls. Spouse wants the CSW to see if the [facility] in [place] has bed availability and if not, he would like to review some facilities in [place]. No further needs identified by Case Manager at this time.   Expected Discharge Plan: Skilled Nursing Facility Barriers to Discharge: Continued Medical Work up  Expected Discharge Plan and Services Expected Discharge Plan: [place] In-house Referral: Clinical Social Work Discharge Planning Services: CM Consult   Living arrangements for the past 2 months: Single Family Home                   Readmission Risk Interventions    03/06/2022   12:36 PM  Readmission Risk Prevention Plan  Transportation Screening Complete  HRI or Home Care Consult Complete  Social Work Consult for [place] Planning/Counseling Complete

## 2022-03-07 NOTE — TOC Progression Note (Addendum)
Transition of Care [facility]) - Progression Note    Patient Details  Name: Tara [name] MRN: [id number] Date of Birth: 1945/12/03  Transition of Care [facility]) CM/SW [place], [place] Phone Number: 03/07/2022, 1:16 PM  Clinical Narrative:     Update- CSW received callback from [name] who chose SNF placement  at [facility]. CSW spoke with Star at [facility] who confirmed SNF bed offer. Star  informed CSW to check in with her on Sunday to see if they can accept patient for dc if medically ready.  CSW provided patients spouse with SNF bed offers. Patients spouse request for CSW to call his daughter [name] to go over SNF bed offers. CSW called [name] and provided SNF bed offers. [name] request to look over and will call CSW back with SNF choice. Patients passr was approved. CSW will continue to follow and assist with patients dc planning needs.  Expected Discharge Plan: Skilled Nursing Facility Barriers to Discharge: Continued Medical Work up  Expected Discharge Plan and Services Expected Discharge Plan: [place] In-house Referral: Clinical Social Work Discharge Planning Services: CM Consult   Living arrangements for the past 2 months: Single Family Home                                       Social Determinants of Health (SDOH) Interventions    Readmission Risk Interventions    03/06/2022   12:36 PM  Readmission Risk Prevention Plan  Transportation Screening Complete  HRI or [place] Complete  Social Work Consult for [place] Planning/Counseling Complete

## 2022-03-07 NOTE — Progress Notes (Signed)
OT Cancellation Note  Patient Details Name: Tara [name] MRN: [id number] DOB: 1946-04-05   Cancelled Treatment:    Reason Eval/Treat Not Completed: Patient declined, no reason specified (Patient was eating lunch and stated she just vomited and asked that I come back later. OT to attempt later today.) Lodema Hong, [place]  Pager [phone] Office [place] 03/07/2022, 12:57 PM

## 2022-03-07 NOTE — Progress Notes (Signed)
OT Cancellation Note  Patient Details Name: Tara [name] MRN: [id number] DOB: 1946-04-06   Cancelled Treatment:    Reason Eval/Treat Not Completed: Patient declined, no reason specified (Patient declined rehab today stating she was vomiting earlier and did not feel well.) Lodema Hong, [place]  Pager [phone] Office [place] 03/07/2022, 2:44 PM

## 2022-03-08 DIAGNOSIS — N179 Acute kidney failure, unspecified: Secondary | ICD-10-CM | POA: Diagnosis not present

## 2022-03-08 DIAGNOSIS — N1832 Chronic kidney disease, stage 3b: Secondary | ICD-10-CM | POA: Diagnosis not present

## 2022-03-08 LAB — BASIC METABOLIC PANEL
Anion gap: 9 (ref 5–15)
BUN: 43 mg/dL — ABNORMAL HIGH (ref 8–23)
CO2: 22 mmol/L (ref 22–32)
Calcium: 8.2 mg/dL — ABNORMAL LOW (ref 8.9–10.3)
Chloride: 98 mmol/L (ref 98–111)
Creatinine, Ser: 1.58 mg/dL — ABNORMAL HIGH (ref 0.44–1.00)
GFR, Estimated: 34 mL/min — ABNORMAL LOW (ref >60)
Glucose, Bld: 99 mg/dL (ref 70–99)
Potassium: 4.4 mmol/L (ref 3.5–5.1)
Sodium: 129 mmol/L — ABNORMAL LOW (ref 135–145)

## 2022-03-08 LAB — CBC
HCT: 26.1 % — ABNORMAL LOW (ref 36.0–46.0)
Hemoglobin: 8.6 g/dL — ABNORMAL LOW (ref 12.0–15.0)
MCH: 28.2 pg (ref 26.0–34.0)
MCHC: 33 g/dL (ref 30.0–36.0)
MCV: 85.6 fL (ref 80.0–100.0)
Platelets: 196 10*3/uL (ref 150–400)
RBC: 3.05 MIL/uL — ABNORMAL LOW (ref 3.87–5.11)
RDW: 16.4 % — ABNORMAL HIGH (ref 11.5–15.5)
WBC: 20 10*3/uL — ABNORMAL HIGH (ref 4.0–10.5)
nRBC: 0 % (ref 0.0–0.2)

## 2022-03-08 LAB — GLUCOSE, CAPILLARY
Glucose-Capillary: 95 mg/dL (ref 70–99)
Glucose-Capillary: 98 mg/dL (ref 70–99)
Glucose-Capillary: 134 mg/dL — ABNORMAL HIGH (ref 70–99)
Glucose-Capillary: 117 mg/dL — ABNORMAL HIGH (ref 70–99)

## 2022-03-08 LAB — SODIUM, URINE, RANDOM: Sodium, Ur: 57 mmol/L

## 2022-03-08 LAB — OSMOLALITY: Osmolality: 281 mOsm/kg (ref 275–295)

## 2022-03-08 LAB — CREATININE, URINE, RANDOM: Creatinine, Urine: 44.25 mg/dL

## 2022-03-08 LAB — OSMOLALITY, URINE: Osmolality, Ur: 450 mOsm/kg (ref 300–900)

## 2022-03-08 LAB — URIC ACID: Uric Acid, Serum: 3.2 mg/dL (ref 2.5–7.1)

## 2022-03-08 MED ORDER — DILTIAZEM HCL 25 MG/5ML IV SOLN: 10.0000 mg | Freq: Four times a day (QID) | INTRAVENOUS | Status: DC | PRN

## 2022-03-08 MED ORDER — SACUBITRIL-VALSARTAN 24-26 MG PO TABS
1.0000 | ORAL_TABLET | Freq: Two times a day (BID) | ORAL | Status: DC
  Filled 2022-03-08: qty 1

## 2022-03-08 MED ORDER — METOPROLOL TARTRATE 25 MG PO TABS: 25.0000 mg | ORAL_TABLET | Freq: Two times a day (BID) | ORAL | Status: DC

## 2022-03-08 MED ORDER — AMIODARONE HCL 200 MG PO TABS: 200.0000 mg | ORAL_TABLET | Freq: Two times a day (BID) | ORAL | Status: DC

## 2022-03-08 MED ORDER — MIDODRINE HCL 5 MG PO TABS
5.0000 mg | ORAL_TABLET | Freq: Two times a day (BID) | ORAL | Status: DC
  Administered 2022-03-08 – 2022-03-12 (×10): 5 mg via ORAL
  Filled 2022-03-08 (×10): qty 1

## 2022-03-08 MED ORDER — ONDANSETRON HCL 4 MG/2ML IJ SOLN
4.0000 mg | Freq: Once | INTRAMUSCULAR | Status: AC
Start: 2022-03-08 — End: 2022-03-08
  Administered 2022-03-08: 4 mg via INTRAVENOUS
  Filled 2022-03-08: qty 2

## 2022-03-08 MED ORDER — LOPERAMIDE HCL 2 MG PO CAPS
2.0000 mg | ORAL_CAPSULE | Freq: Four times a day (QID) | ORAL | Status: DC | PRN
  Administered 2022-03-08: 2 mg via ORAL
  Filled 2022-03-08: qty 1

## 2022-03-08 MED ORDER — DEXAMETHASONE 2 MG PO TABS
2.0000 mg | ORAL_TABLET | Freq: Two times a day (BID) | ORAL | Status: DC
  Administered 2022-03-08 – 2022-03-12 (×8): 2 mg via ORAL
  Filled 2022-03-08 (×8): qty 1

## 2022-03-08 MED ORDER — FUROSEMIDE 40 MG PO TABS
40.0000 mg | ORAL_TABLET | Freq: Once | ORAL | Status: AC
  Administered 2022-03-08: 40 mg via ORAL
  Filled 2022-03-08: qty 1

## 2022-03-08 MED ORDER — METOCLOPRAMIDE HCL 5 MG PO TABS
10.0000 mg | ORAL_TABLET | Freq: Two times a day (BID) | ORAL | Status: DC
  Administered 2022-03-08: 10 mg via ORAL
  Filled 2022-03-08: qty 2

## 2022-03-08 NOTE — Progress Notes (Addendum)
PROGRESS NOTE        PATIENT DETAILS Name: Tara [name] Age: 76 y.o. Sex: female Date of Birth: 05/17/1946 Admit Date: 02/27/2022 Admitting Physician Julian Hy, DO [id number], [place] Levo, MD  Brief Summary: Patient is a 76 y.o.  female with a history of HTN, HLD, CKD stage IIIb, chronic HFpEF, meningioma, breast CA-s/p right lumpectomy and chemoradiation in 2008, depression/anxiety who presented with weakness/fatigue/confusion/slurred speech-she was found to have AKI-and initially admitted to the ICU for possible RRT.  However with supportive care-renal function improved-she was subsequently transferred to the [facility] hospitalist service on 6/11.  Further hospital course was complicated by development of A-fib RVR along with hypotension-necessitating transfer to the ICU again.  She was started on amiodarone infusion-she converted to sinus rhythm and was transferred back to [facility] on 6/14.  Significant events: 6/08>> presented to ED with weakness/fatigue/confusion/slurred sleep-AKI-meningioma.  Admitted by PCCM to ICU 6/11>> transfer to [facility] 6/12>> A-fib RVR with hypotension.  Amiodarone infusion started.  Transferred to ICU-subsequently converted to sinus rhythm. 6/14>> transfer to [facility].  Significant studies: 6/08>> CT head: Interval increase in size of previously noted planum sphenoidal meningioma. 6/08>> CT C-spine: No fracture or dislocation 6/08>> CXR: No cardiopulmonary disease. 6/08>> renal ultrasound: No hydronephrosis. 6/09>> Echo: EF 55% 6/12>> TSH: 0.429 6/13>> CXR: No PNA  Significant microbiology data: 6/08>> influenza/COVID PCR: Negative 6/13>> blood culture: Negative  Procedures: None  Consults: Nephrology, PCCM, cardiology  Subjective:  Patient in bed, appears comfortable, denies any headache, no fever, no chest pain or pressure, no shortness of breath , no abdominal pain. No new focal weakness.  Objective: Vitals: Blood pressure (!)  107/58, pulse (!) 52, temperature 98 F (36.7 C), temperature source Oral, resp. rate 18, height '4\' 9"'$  (1.448 m), weight 86.2 kg, SpO2 92 %.   Exam:  Awake Alert, No new F.N deficits, Normal affect [place].AT,PERRAL Supple Neck, No JVD,   Symmetrical Chest wall movement, Good air movement bilaterally, CTAB RRR,No Gallops, Rubs or new Murmurs,  +ve B.Sounds, Abd Soft, No tenderness,   No Cyanosis, Clubbing or edema      Assessment/Plan:  AKI on CKD stage IIIb  Creatinine 1.7: AKI hemodynamically mediated in the setting of hypotension/Entresto/Aldactone/Jardiance and NSAID use.  Renal function has improved significantly and now close to baseline. Monitor her renal function on Entresto being resumed.  A-fib with RVR hypotension: Maintaining sinus rhythm-BP slightly on the softer side and heart rate in mid 50s, have cut amiodarone into half and skipped metoprolol on 03/08/2022.  Will cut down the dose further if needed.  Meningioma possible vasogenic edema: Slurred speech has resolved-Decadron.  Discussed with neurosurgeon-Dr. Dawley-recommendations are to pursue possible craniotomy/resection in the outpatient setting-recommends that taper Decadron to off in 2 weeks.  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  Check serum osmolality, urine electrolytes and osmolality, hold Zoloft, restrict fluid intake and gentle Lasix on 03/08/2022.  Weakness and deconditioning.  PT OT, now agreeable to going to SNF.  Likely on Monday.  Normocytic anemia: Due to CKD-worsened due to AKI and IVF administration.  No indications for transfusion-watch closely.  Leukocytosis: Suspect due to steroids-UA/chest x-ray/blood cultures negative for infection.  Will started on empiric cefepime while in the ICU-since no evidence of infection-we will discontinue and monitor off antimicrobial therapy.  Chronic HFpEF: Volume status stable-cardiology restarting Entresto and metoprolol .  HTN: BP stable-not on  any antihypertensives  currently.  HLD: On Crestor.  Prediabetes (A1c 6.3 on 6/8)-with steroid-induced hyperglycemia: CBG stable on SSI and 10 units of Semglee  Recent Labs    03/07/22 2331 03/08/22 0424 03/08/22 0739  GLUCAP 114* 95 98    Depression/anxiety: Continue Zoloft/Klonopin.  GERD: Continue PPI  Constipation: Had small BM with senna/MiraLAX-follow for now.  Morbid Obesity: Estimated body mass index is 41.12 kg/m as calculated from the following:   Height as of this encounter: '4\' 9"'$  (1.448 m).   Weight as of this encounter: 86.2 kg.   Code status:   Code Status: Full Code   DVT Prophylaxis:  apixaban (ELIQUIS) tablet 2.5 mg Start: 03/04/22 2200 SCDs Start: 02/27/22 2010 apixaban (ELIQUIS) tablet 2.5 mg    Family Communication: Spouse at bedside on 03/07/2022   Disposition Plan: Status is: Inpatient Remains inpatient appropriate because: Decreasing leukocytosis-CHF meds being restarted-SNF hopefully in the next 1-2 days.   Planned Discharge Destination:Skilled nursing facility  The patient is critically ill with multiple organ system failure and requires high complexity decision making for assessment and support, frequent evaluation and titration of therapies, advanced monitoring, review of radiographic studies and interpretation of complex data.     Diet: Diet Order             Diet heart healthy/carb modified Room service appropriate? Yes with Assist; Fluid consistency: Thin; Fluid restriction: 1200 mL Fluid  Diet effective now                       MEDICATIONS: Scheduled Meds:  [START ON 03/11/2022] amiodarone  200 mg Oral Daily   amiodarone  400 mg Oral BID   apixaban  2.5 mg Oral BID   clonazePAM  0.5 mg Oral QHS   dexamethasone  2 mg Oral Q12H   empagliflozin  10 mg Oral Daily   furosemide  40 mg Oral Once   insulin aspart  0-9 Units Subcutaneous Q4H   insulin glargine-yfgn  10 Units Subcutaneous QHS   lidocaine  1 patch Transdermal Q24H   metoprolol  succinate  50 mg Oral Daily   montelukast  10 mg Oral QHS   pantoprazole  40 mg Oral Q1200   polyethylene glycol  17 g Oral BID   rosuvastatin  10 mg Oral Daily   [START ON 03/09/2022] sacubitril-valsartan  1 tablet Oral BID   senna  2 tablet Oral Daily   Continuous Infusions:  sodium chloride Stopped (03/04/22 1818)   PRN Meds:.sodium chloride, acetaminophen, bisacodyl, docusate sodium, melatonin, ondansetron (ZOFRAN) IV   I have personally reviewed following labs and imaging studies  LABORATORY DATA:  Recent Labs  Lab 03/04/22 0605 03/05/22 0100 03/06/22 0302 03/07/22 0211 03/08/22 0132  WBC 34.0* 35.4* 25.3* 25.7* 20.0*  HGB 9.0* 8.4* 8.3* 8.3* 8.6*  HCT 27.3* 25.1* 24.2* 25.8* 26.1*  PLT 204 201 125* 227 196  MCV 85.6 84.2 83.7 85.4 85.6  MCH 28.2 28.2 28.7 27.5 28.2  MCHC 33.0 33.5 34.3 32.2 33.0  RDW 17.0* 16.4* 16.2* 16.0* 16.4*    Recent Labs  Lab 03/03/22 0816 03/03/22 1007 03/03/22 1054 03/03/22 1340 03/04/22 0605 03/05/22 0100 03/06/22 0302 03/07/22 0211 03/08/22 0132  NA 138  --   --   --  135 132* 132* 133* 129*  K 4.2  --   --   --  4.3 4.1 4.5 4.4 4.4  CL 107  --   --   --  101 101 101 103  98  CO2 22  --   --   --  25 22 21* 23 22  GLUCOSE 106*  --   --   --  118* 129* 97 91 99  BUN 36*  --   --   --  35* 38* 42* 44* 43*  CREATININE 1.65*  --   --   --  1.65* 1.56* 1.53* 1.74* 1.58*  CALCIUM 7.8*  --   --   --  8.5* 8.4* 8.1* 8.6* 8.2*  AST 43*  --   --   --  43*  --   --   --   --   ALT 21  --   --   --  28  --   --   --   --   ALKPHOS 41  --   --   --  46  --   --   --   --   BILITOT 0.6  --   --   --  0.8  --   --   --   --   ALBUMIN 2.2*  --   --   --  2.6*  --   --   --   --   MG  --  1.7  --   --  2.3 2.0  --   --   --   PHOS  --   --   --   --   --  3.6  --   --   --   LATICACIDVEN  --   --  1.9 1.7  --   --   --   --   --   TSH 0.429  --   --   --   --   --   --   --   --     RADIOLOGY STUDIES/RESULTS: No results found.   LOS:  9 days   Signature  Lala Lund M.D on 03/08/2022 at 9:31 AM   -  To page go to [email or web address]

## 2022-03-09 DIAGNOSIS — N1832 Chronic kidney disease, stage 3b: Secondary | ICD-10-CM | POA: Diagnosis not present

## 2022-03-09 DIAGNOSIS — N179 Acute kidney failure, unspecified: Secondary | ICD-10-CM | POA: Diagnosis not present

## 2022-03-09 LAB — CULTURE, BLOOD (ROUTINE X 2)
Culture: NO GROWTH
Culture: NO GROWTH

## 2022-03-09 LAB — BASIC METABOLIC PANEL
Anion gap: 11 (ref 5–15)
BUN: 50 mg/dL — ABNORMAL HIGH (ref 8–23)
CO2: 22 mmol/L (ref 22–32)
Calcium: 8.5 mg/dL — ABNORMAL LOW (ref 8.9–10.3)
Chloride: 98 mmol/L (ref 98–111)
Creatinine, Ser: 1.8 mg/dL — ABNORMAL HIGH (ref 0.44–1.00)
GFR, Estimated: 29 mL/min — ABNORMAL LOW (ref >60)
Glucose, Bld: 108 mg/dL — ABNORMAL HIGH (ref 70–99)
Potassium: 4.2 mmol/L (ref 3.5–5.1)
Sodium: 131 mmol/L — ABNORMAL LOW (ref 135–145)

## 2022-03-09 LAB — GLUCOSE, CAPILLARY
Glucose-Capillary: 104 mg/dL — ABNORMAL HIGH (ref 70–99)
Glucose-Capillary: 108 mg/dL — ABNORMAL HIGH (ref 70–99)
Glucose-Capillary: 115 mg/dL — ABNORMAL HIGH (ref 70–99)
Glucose-Capillary: 124 mg/dL — ABNORMAL HIGH (ref 70–99)
Glucose-Capillary: 146 mg/dL — ABNORMAL HIGH (ref 70–99)
Glucose-Capillary: 98 mg/dL (ref 70–99)

## 2022-03-09 MED ORDER — LOPERAMIDE HCL 2 MG PO CAPS
4.0000 mg | ORAL_CAPSULE | Freq: Once | ORAL | Status: AC
  Administered 2022-03-09: 4 mg via ORAL
  Filled 2022-03-09: qty 2

## 2022-03-09 MED ORDER — LACTATED RINGERS IV SOLN: INTRAVENOUS | Status: AC

## 2022-03-09 MED ORDER — AMIODARONE HCL 200 MG PO TABS
200.0000 mg | ORAL_TABLET | Freq: Every day | ORAL | Status: DC
  Administered 2022-03-10 – 2022-03-13 (×4): 200 mg via ORAL
  Filled 2022-03-09 (×4): qty 1

## 2022-03-09 NOTE — Progress Notes (Addendum)
PROGRESS NOTE        PATIENT DETAILS Name: Tara [name] Age: 76 y.o. Sex: female Date of Birth: September 28, 1945 Admit Date: 02/27/2022 Admitting Physician Julian Hy, DO HER:DEYCXKG, Norwood Levo, MD  Brief Summary: Patient is a 76 y.o.  female with a history of HTN, HLD, CKD stage IIIb, chronic HFpEF, meningioma, breast CA-s/p right lumpectomy and chemoradiation in 2008, depression/anxiety who presented with weakness/fatigue/confusion/slurred speech-she was found to have AKI-and initially admitted to the ICU for possible RRT.  However with supportive care-renal function improved-she was subsequently transferred to the [facility] hospitalist service on 6/11.  Further hospital course was complicated by development of A-fib RVR along with hypotension-necessitating transfer to the ICU again.  She was started on amiodarone infusion-she converted to sinus rhythm and was transferred back to [facility] on 6/14.  Significant events: 6/08>> presented to ED with weakness/fatigue/confusion/slurred sleep-AKI-meningioma.  Admitted by PCCM to ICU 6/11>> transfer to [facility] 6/12>> A-fib RVR with hypotension.  Amiodarone infusion started.  Transferred to ICU-subsequently converted to sinus rhythm. 6/14>> transfer to [facility].  Significant studies: 6/08>> CT head: Interval increase in size of previously noted planum sphenoidal meningioma. 6/08>> CT C-spine: No fracture or dislocation 6/08>> CXR: No cardiopulmonary disease. 6/08>> renal ultrasound: No hydronephrosis. 6/09>> Echo: EF 55% 6/12>> TSH: 0.429 6/13>> CXR: No PNA  Significant microbiology data: 6/08>> influenza/COVID PCR: Negative 6/13>> blood culture: Negative  Procedures: None  Consults: Nephrology, PCCM, cardiology  Subjective:  Patient in bed, appears comfortable, denies any headache, no fever, no chest pain or pressure, no shortness of breath , no abdominal pain. No new focal weakness.  Objective: Vitals: Blood pressure (!)  98/54, pulse (!) 53, temperature 98.1 F (36.7 C), temperature source Oral, resp. rate 14, height '4\' 9"'$  (1.448 m), weight 86.4 kg, SpO2 96 %.   Exam:  Awake Alert, No new F.N deficits, Normal affect [place].AT,PERRAL Supple Neck, No JVD,   Symmetrical Chest wall movement, Good air movement bilaterally, CTAB RRR,No Gallops, Rubs or new Murmurs,  +ve B.Sounds, Abd Soft, No tenderness,   No Cyanosis, Clubbing or edema      Assessment/Plan:  AKI on CKD stage IIIb  Creatinine 1.7: AKI hemodynamically mediated in the setting of hypotension/discontinue Entresto, Aldactone and Jardiance, still hypotensive, IV fluids on 03/09/2021 and monitor  HTN >> now Hypotension.  Hold diuretics, beta-blocker and Entresto, hydrate on 03/09/2022 and monitor.  A-fib with RVR hypotension: Maintaining sinus rhythm-BP slightly on the softer side and heart rate in mid 50s, beta-blocker stopped, amiodarone dose cut down.  Monitor on telemetry.  Meningioma possible vasogenic edema: Slurred speech has resolved-Decadron.  Discussed with neurosurgeon-Dr. Dawley-recommendations are to pursue possible craniotomy/resection in the outpatient setting-recommends that taper Decadron to off in 2 weeks.  Weakness and deconditioning.  PT OT, now agreeable to going to SNF.  Likely on Monday.  Normocytic anemia: Due to CKD-worsened due to AKI and IVF administration.  No indications for transfusion-watch closely.  Leukocytosis: Suspect due to steroids-UA/chest x-ray/blood cultures negative for infection.  Will started on empiric cefepime while in the ICU-since no evidence of infection-we will discontinue and monitor off antimicrobial therapy.  Chronic HFpEF: Seen by cardiology, currently dehydrated clinically.  HLD: On Crestor.  Prediabetes (A1c 6.3 on 6/8)-with steroid-induced hyperglycemia: CBG stable on SSI and 10 units of Semglee  Recent Labs    03/09/22 0021 03/09/22 0456 03/09/22 8185  GLUCAP 108* 124* 104*     Depression/anxiety: Continue Zoloft/Klonopin.  GERD: Continue PPI  Constipation: Resolved.  Morbid Obesity: Estimated body mass index is 41.22 kg/m as calculated from the following:   Height as of this encounter: '4\' 9"'$  (1.448 m).   Weight as of this encounter: 86.4 kg.   Code status:   Code Status: Full Code   DVT Prophylaxis:  Place TED hose Start: 03/09/22 0636 apixaban (ELIQUIS) tablet 2.5 mg Start: 03/04/22 2200 SCDs Start: 02/27/22 2010 apixaban (ELIQUIS) tablet 2.5 mg    Family Communication:   Spouse at bedside on 03/07/2022, 03/08/2022, 03/09/2022  Daughter [phone] on 03/09/22   Disposition Plan: Status is: Inpatient Remains inpatient appropriate because: Decreasing leukocytosis-CHF meds being restarted-SNF hopefully in the next 1-2 days.   Planned Discharge Destination:Skilled nursing facility  The patient is critically ill with multiple organ system failure and requires high complexity decision making for assessment and support, frequent evaluation and titration of therapies, advanced monitoring, review of radiographic studies and interpretation of complex data.     Diet: Diet Order             Diet heart healthy/carb modified Room service appropriate? Yes with Assist; Fluid consistency: Thin; Fluid restriction: 1200 mL Fluid  Diet effective now                     MEDICATIONS: Scheduled Meds:  [START ON 03/10/2022] amiodarone  200 mg Oral Daily   apixaban  2.5 mg Oral BID   clonazePAM  0.5 mg Oral QHS   dexamethasone  2 mg Oral Q12H   insulin aspart  0-9 Units Subcutaneous Q4H   insulin glargine-yfgn  10 Units Subcutaneous QHS   lidocaine  1 patch Transdermal Q24H   midodrine  5 mg Oral BID WC   montelukast  10 mg Oral QHS   pantoprazole  40 mg Oral Q1200   rosuvastatin  10 mg Oral Daily   senna  2 tablet Oral Daily   Continuous Infusions:  sodium chloride Stopped (03/04/22 1818)   lactated ringers 125 mL/hr at 03/09/22 0700   PRN  Meds:.sodium chloride, acetaminophen, diltiazem, loperamide, melatonin, ondansetron (ZOFRAN) IV   I have personally reviewed following labs and imaging studies  LABORATORY DATA:  Recent Labs  Lab 03/04/22 0605 03/05/22 0100 03/06/22 0302 03/07/22 0211 03/08/22 0132  WBC 34.0* 35.4* 25.3* 25.7* 20.0*  HGB 9.0* 8.4* 8.3* 8.3* 8.6*  HCT 27.3* 25.1* 24.2* 25.8* 26.1*  PLT 204 201 125* 227 196  MCV 85.6 84.2 83.7 85.4 85.6  MCH 28.2 28.2 28.7 27.5 28.2  MCHC 33.0 33.5 34.3 32.2 33.0  RDW 17.0* 16.4* 16.2* 16.0* 16.4*    Recent Labs  Lab 03/03/22 0816 03/03/22 1007 03/03/22 1054 03/03/22 1340 03/04/22 0605 03/05/22 0100 03/06/22 0302 03/07/22 0211 03/08/22 0132 03/09/22 0136  NA 138  --   --   --  135 132* 132* 133* 129* 131*  K 4.2  --   --   --  4.3 4.1 4.5 4.4 4.4 4.2  CL 107  --   --   --  101 101 101 103 98 98  CO2 22  --   --   --  25 22 21* '23 22 22  '$ GLUCOSE 106*  --   --   --  118* 129* 97 91 99 108*  BUN 36*  --   --   --  35* 38* 42* 44* 43* 50*  CREATININE 1.65*  --   --   --  1.65* 1.56* 1.53* 1.74* 1.58* 1.80*  CALCIUM 7.8*  --   --   --  8.5* 8.4* 8.1* 8.6* 8.2* 8.5*  AST 43*  --   --   --  43*  --   --   --   --   --   ALT 21  --   --   --  28  --   --   --   --   --   ALKPHOS 41  --   --   --  46  --   --   --   --   --   BILITOT 0.6  --   --   --  0.8  --   --   --   --   --   ALBUMIN 2.2*  --   --   --  2.6*  --   --   --   --   --   MG  --  1.7  --   --  2.3 2.0  --   --   --   --   PHOS  --   --   --   --   --  3.6  --   --   --   --   LATICACIDVEN  --   --  1.9 1.7  --   --   --   --   --   --   TSH 0.429  --   --   --   --   --   --   --   --   --     RADIOLOGY STUDIES/RESULTS: No results found.   LOS: 10 days   Signature  Lala Lund M.D on 03/09/2022 at 10:09 AM   -  To page go to [email or web address]

## 2022-03-09 NOTE — TOC Progression Note (Signed)
Transition of Care [facility]) - Progression Note    Patient Details  Name: Tara [name] MRN: [id number] Date of Birth: [birth year]  Transition of Care [facility]) CM/SW [place], LCSW Phone Number:[phone] 03/09/2022, 8:31 AM  Clinical Narrative:    Pt's authorization approved. Approval dates 6/18-6/21.  TOC team will continue to assist with discharge planning needs.    Expected Discharge Plan: Skilled Nursing Facility Barriers to Discharge: Continued Medical Work up  Expected Discharge Plan and Services Expected Discharge Plan: [place] In-house Referral: Clinical Social Work Discharge Planning Services: CM Consult   Living arrangements for the past 2 months: Single Family Home                                       Social Determinants of Health (SDOH) Interventions    Readmission Risk Interventions    03/06/2022   12:36 PM  Readmission Risk Prevention Plan  Transportation Screening Complete  HRI or [place] Complete  Social Work Consult for [place] Planning/Counseling Complete

## 2022-03-10 DIAGNOSIS — N179 Acute kidney failure, unspecified: Secondary | ICD-10-CM | POA: Diagnosis not present

## 2022-03-10 DIAGNOSIS — I4891 Unspecified atrial fibrillation: Secondary | ICD-10-CM | POA: Diagnosis not present

## 2022-03-10 LAB — CBC WITH DIFFERENTIAL/PLATELET
Abs Immature Granulocytes: 0.26 10*3/uL — ABNORMAL HIGH (ref 0.00–0.07)
Basophils Absolute: 0 10*3/uL (ref 0.0–0.1)
Basophils Relative: 0 %
Eosinophils Absolute: 0 10*3/uL (ref 0.0–0.5)
Eosinophils Relative: 0 %
HCT: 26.7 % — ABNORMAL LOW (ref 36.0–46.0)
Hemoglobin: 9.1 g/dL — ABNORMAL LOW (ref 12.0–15.0)
Immature Granulocytes: 1 %
Lymphocytes Relative: 6 %
Lymphs Abs: 1.1 10*3/uL (ref 0.7–4.0)
MCH: 28.8 pg (ref 26.0–34.0)
MCHC: 34.1 g/dL (ref 30.0–36.0)
MCV: 84.5 fL (ref 80.0–100.0)
Monocytes Absolute: 1.2 10*3/uL — ABNORMAL HIGH (ref 0.1–1.0)
Monocytes Relative: 7 %
Neutro Abs: 15.6 10*3/uL — ABNORMAL HIGH (ref 1.7–7.7)
Neutrophils Relative %: 86 %
Platelets: 196 10*3/uL (ref 150–400)
RBC: 3.16 MIL/uL — ABNORMAL LOW (ref 3.87–5.11)
RDW: 16 % — ABNORMAL HIGH (ref 11.5–15.5)
WBC: 18.2 10*3/uL — ABNORMAL HIGH (ref 4.0–10.5)
nRBC: 0 % (ref 0.0–0.2)

## 2022-03-10 LAB — GLUCOSE, CAPILLARY
Glucose-Capillary: 113 mg/dL — ABNORMAL HIGH (ref 70–99)
Glucose-Capillary: 118 mg/dL — ABNORMAL HIGH (ref 70–99)
Glucose-Capillary: 131 mg/dL — ABNORMAL HIGH (ref 70–99)
Glucose-Capillary: 138 mg/dL — ABNORMAL HIGH (ref 70–99)
Glucose-Capillary: 141 mg/dL — ABNORMAL HIGH (ref 70–99)
Glucose-Capillary: 92 mg/dL (ref 70–99)
Glucose-Capillary: 98 mg/dL (ref 70–99)

## 2022-03-10 LAB — COMPREHENSIVE METABOLIC PANEL
ALT: 31 U/L (ref 0–44)
AST: 18 U/L (ref 15–41)
Albumin: 2.5 g/dL — ABNORMAL LOW (ref 3.5–5.0)
Alkaline Phosphatase: 46 U/L (ref 38–126)
Anion gap: 7 (ref 5–15)
BUN: 40 mg/dL — ABNORMAL HIGH (ref 8–23)
CO2: 23 mmol/L (ref 22–32)
Calcium: 8.4 mg/dL — ABNORMAL LOW (ref 8.9–10.3)
Chloride: 100 mmol/L (ref 98–111)
Creatinine, Ser: 1.66 mg/dL — ABNORMAL HIGH (ref 0.44–1.00)
GFR, Estimated: 32 mL/min — ABNORMAL LOW (ref >60)
Glucose, Bld: 113 mg/dL — ABNORMAL HIGH (ref 70–99)
Potassium: 4.6 mmol/L (ref 3.5–5.1)
Sodium: 130 mmol/L — ABNORMAL LOW (ref 135–145)
Total Bilirubin: 0.5 mg/dL (ref 0.3–1.2)
Total Protein: 5.2 g/dL — ABNORMAL LOW (ref 6.5–8.1)

## 2022-03-10 LAB — MAGNESIUM: Magnesium: 2 mg/dL (ref 1.7–2.4)

## 2022-03-10 MED ORDER — INSULIN ASPART 100 UNIT/ML IJ SOLN
0.0000 [IU] | Freq: Three times a day (TID) | INTRAMUSCULAR | Status: DC
  Administered 2022-03-11: 1 [IU] via SUBCUTANEOUS

## 2022-03-10 NOTE — Progress Notes (Addendum)
[facility] Progress note  Progress Note  Patient Name: Tara [name] Date of Encounter: 03/10/2022  [facility] Cardiologist: Dr. Aundra Dubin  Subjective   Husband notes that she has not been toileting herself and is worried she needs rehab. NO CP, SOB, palpitations.  Inpatient Medications    Scheduled Meds:  amiodarone  200 mg Oral Daily   apixaban  2.5 mg Oral BID   clonazePAM  0.5 mg Oral QHS   dexamethasone  2 mg Oral Q12H   insulin aspart  0-9 Units Subcutaneous Q4H   insulin glargine-yfgn  10 Units Subcutaneous QHS   lidocaine  1 patch Transdermal Q24H   midodrine  5 mg Oral BID WC   montelukast  10 mg Oral QHS   pantoprazole  40 mg Oral Q1200   rosuvastatin  10 mg Oral Daily   senna  2 tablet Oral Daily   Continuous Infusions:  sodium chloride Stopped (03/04/22 1818)   PRN Meds: sodium chloride, acetaminophen, diltiazem, loperamide, melatonin, ondansetron (ZOFRAN) IV   Vital Signs    Vitals:   03/09/22 2025 03/10/22 0347 03/10/22 0640 03/10/22 1000  BP: (!) 115/43 (!) 111/50  (!) 115/55  Pulse: 60 60  65  Resp: (!) '24 19  16  '$ Temp: 98.3 F (36.8 C) 98.3 F (36.8 C)  98.4 F (36.9 C)  TempSrc: Oral Oral  Oral  SpO2:    92%  Weight:   85.3 kg   Height:        Intake/Output Summary (Last 24 hours) at 03/10/2022 1016 Last data filed at 03/10/2022 1000 Gross per 24 hour  Intake 2773.09 ml  Output 400 ml  Net 2373.09 ml      03/10/2022    6:40 AM 03/09/2022    6:48 AM 03/07/2022    4:32 AM  Last 3 Weights  Weight (lbs) 188 lb 0.8 oz 190 lb 7.6 oz 190 lb 0.6 oz  Weight (kg) 85.3 kg 86.4 kg 86.2 kg      Telemetry    SR sinus bradycardia - Personally Reviewed  Physical Exam   GEN: No acute distress, morbid obesity Neck: No JVD Cardiac: regular bradycardia, no murmurs, rubs, or gallops.  Respiratory: Clear to auscultation bilaterally. On room air.  GI: Soft, nontender MS: No leg edema Neuro:  Nonfocal  Psych: Normal affect   Labs    High  Sensitivity Troponin:   Recent Labs  Lab 02/27/22 1438 02/27/22 2156 03/03/22 0816 03/03/22 1007  TROPONINIHS 26* 30* 26* 21*     Chemistry Recent Labs  Lab 03/04/22 0605 03/05/22 0100 03/06/22 0302 03/08/22 0132 03/09/22 0136 03/10/22 0340  NA 135 132*   < > 129* 131* 130*  K 4.3 4.1   < > 4.4 4.2 4.6  CL 101 101   < > 98 98 100  CO2 25 22   < > '22 22 23  '$ GLUCOSE 118* 129*   < > 99 108* 113*  BUN 35* 38*   < > 43* 50* 40*  CREATININE 1.65* 1.56*   < > 1.58* 1.80* 1.66*  CALCIUM 8.5* 8.4*   < > 8.2* 8.5* 8.4*  MG 2.3 2.0  --   --   --  2.0  PROT 5.7*  --   --   --   --  5.2*  ALBUMIN 2.6*  --   --   --   --  2.5*  AST 43*  --   --   --   --  18  ALT 28  --   --   --   --  31  ALKPHOS 46  --   --   --   --  46  BILITOT 0.8  --   --   --   --  0.5  GFRNONAA 32* 34*   < > 34* 29* 32*  ANIONGAP 9 9   < > '9 11 7   '$ < > = values in this interval not displayed.    Lipids No results for input(s): "CHOL", "TRIG", "HDL", "LABVLDL", "LDLCALC", "CHOLHDL" in the last 168 hours.  Hematology Recent Labs  Lab 03/07/22 0211 03/08/22 0132 03/10/22 0340  WBC 25.7* 20.0* 18.2*  RBC 3.02* 3.05* 3.16*  HGB 8.3* 8.6* 9.1*  HCT 25.8* 26.1* 26.7*  MCV 85.4 85.6 84.5  MCH 27.5 28.2 28.8  MCHC 32.2 33.0 34.1  RDW 16.0* 16.4* 16.0*  PLT 227 196 196   Thyroid  No results for input(s): "TSH", "FREET4" in the last 168 hours.   BNP No results for input(s): "BNP", "PROBNP" in the last 168 hours.   DDimer No results for input(s): "DDIMER" in the last 168 hours.   Radiology    No results found.  Cardiac Studies   Echo from 02/28/22:    1. Left ventricular ejection fraction, by estimation, is 55%. The left  ventricle has normal function. The left ventricle has no regional wall  motion abnormalities. Left ventricular diastolic parameters are consistent  with Grade I diastolic dysfunction  (impaired relaxation).   2. Peak RV-RA gradient 25 mmHg. Right ventricular systolic function  is  normal. The right ventricular size is normal.   3. The mitral valve is normal in structure. No evidence of mitral valve  regurgitation. No evidence of mitral stenosis.   4. The aortic valve is tricuspid. There is mild calcification of the  aortic valve. Aortic valve regurgitation is not visualized. No aortic  stenosis is present.      Echo from 04/04/21:    1. EF improved since echo done 02/10/20 . Left ventricular ejection  fraction, by estimation, is 55%. The left ventricle has normal function.  The left ventricle has no regional wall motion abnormalities. Left  ventricular diastolic parameters were normal.  The average left ventricular global longitudinal strain is -21.7 %. The  global longitudinal strain is normal.   2. Right ventricular systolic function is normal. The right ventricular  size is normal. There is moderately elevated pulmonary artery systolic  pressure.   3. Left atrial size was mildly dilated.   4. The mitral valve is normal in structure. Trivial mitral valve  regurgitation. No evidence of mitral stenosis.   5. Tricuspid valve regurgitation is moderate.   6. The aortic valve is tricuspid. Aortic valve regurgitation is not  visualized. No aortic stenosis is present.   7. The inferior vena cava is normal in size with greater than 50%  respiratory variability, suggesting right atrial pressure of 3 mmHg.      Cardiac MRI from 08/10/17: IMPRESSION: 1.  Normal LV size with EF 49%, diffuse hypokinesis.   2.  Mildly dilated RV with normal systolic function.   3. No myocardial LGE, so no definitive evidence for prior MI, infiltrative disease, or myocarditis.   Right and left heart cath 07/10/17:   1. Mildly elevated PCWP, mild pulmonary hypertension.  2. Preserved cardiac output.  3. No significant coronary disease, nonischemic cardiomyopathy.      Patient Tara [name]  is a 76 y.o. female with a PMH of anxiety with depression, breast cancer  s/p right lumpectomy and chemoradiation 2008 and recurrence with right mastectomy and chemo 2019, hypertension, hyperlipidemia, meningioma, systolic heart failure/nonischemic cardiomyopathy with recovered EF 03/2021, who presented to the ER 02/27/2022 with multiple complaints including slurred speech, confusion, shortness of breath, functional decline, decreased energy, admitted with AKI 2/2 pre-renal azotemia, leukocytosis of unclear etiology,increasing meningioma, cardiology is consulted on 03/03/2022 for the evaluation of A-fib with RVR and hypotension.   Assessment & Plan    New onset of A fib with RVR 03/03/22, paroxysmal  Hx of short PR SVT (remotely) Hypotension, resolved  Anemia - BP low initially limiting rate controlling agents converted to SR with IV amiodarone gtt and now transitioned to PO amiodarone '400mg'$  BID x7 days and then '200mg'$  daily - amio stopped 06/17 due to hypotension, 200 mg qd to start today, continue this, remains in sinus  - Toprol XL d/c'd due to hypotension - CHA2DS2VASc 7, anticoagulation transitioned from heparin gtt to Eliquis, renally dosed, monitor closely for bleeding with existing meningoma    Chronic systolic heart failure Non-ischemic cardiomyopathy Morbid Obesity HTN AKI on CKD IIIb - LVEF has recovered since 04/04/21 Echo to 55%  - right and left heart cath 2018 no CAD - cardiac MRI 2018 no LGE - GDMT:  BP did not tolerate Toprol XL 50 mg and Entresto 24-26 mg bid -  Na+ still low but stable now, renal index within her baseline now - currently off all GDMT, including Jardiance 10 mg  - not on diuretic - SBP 110s on midodrine 5 mg bid  Meningioma Leukocytosis HLD Pre-diabetes Depression/anxiety Debility  Hx of breast cancer - per hospitalist    Has outpatient follow up.   For questions or updates, please contact [place] Please consult [email or web address] for contact info under      Signed, Rosaria Ferries, PA-C  03/10/2022, 10:16 AM     History and all data above reviewed.  Patient examined.  I agree with the findings as above.  No pain.  Denies SOB. The patient exam reveals COR:RRR  ,  Lungs: Clear  ,  Abd: Positive bowel sounds, no rebound no guarding, Ext No edema  .  All available labs, radiology testing, previous records reviewed. Agree with documented assessment and plan. Atrial fib:  Back in NSR.  Starting PO Amio.  Appropriately dose Eliquis.    Acute on chronic diastolic HF:  Limited med titration secondary to hypotension.  No change in therapy today.  Likely no med titration at discharge and she will be followed with weights, watching salt and fluid.    Tara [name]  11:35 AM  03/10/2022

## 2022-03-10 NOTE — TOC Progression Note (Signed)
Transition of Care [facility]) - Progression Note    Patient Details  Name: Tara [name] MRN: [id number] Date of Birth: 05/23/1946  Transition of Care [facility]) CM/SW [place], [place] Phone Number: 03/10/2022, 10:16 AM  Clinical Narrative:     CSW spoke with [facility] at [facility] who confirmed they can accept patient for SNF placement when medically ready. Insurance authorization has been approved. CSW informed MD.CSW will continue to follow and assist with patients dc planning needs.  Expected Discharge Plan: Skilled Nursing Facility Barriers to Discharge: Continued Medical Work up  Expected Discharge Plan and Services Expected Discharge Plan: [place] In-house Referral: Clinical Social Work Discharge Planning Services: CM Consult   Living arrangements for the past 2 months: Single Family Home                                       Social Determinants of Health (SDOH) Interventions    Readmission Risk Interventions    03/06/2022   12:36 PM  Readmission Risk Prevention Plan  Transportation Screening Complete  HRI or [place] Complete  Social Work Consult for [place] Planning/Counseling Complete

## 2022-03-10 NOTE — Progress Notes (Signed)
Physical Therapy Treatment Patient Details Name: Tara [name] MRN: [id number] DOB: [birth year] Today's Date: 03/10/2022   History of Present Illness 76 y/o female admitted 6/8 with weakness, confusion and progressive worsening of slurred speech.  CT head: increased size of known meningioma,   ARF found on work up.  6/12 A-fib RVR with hypotension.  PMHx: HTN, CHF, NiCM, Breast CA with chemo/fdx    PT Comments    Pt received in bed, husband present in room. Pt initially declining participation in therapy. Reluctantly agreeable with encouragement from her husband. She required mod assist supine to sit, min assist sit to stand, and +2 min/HHA ambulation 20'. Pt declining further distance and declining use of RW. Very flat affect with minimal interaction with therapist, avoiding eye contact. Pt in recliner with feet elevated at end of session.    Recommendations for follow up therapy are one component of a multi-disciplinary discharge planning process, led by the attending physician.  Recommendations may be updated based on patient status, additional functional criteria and insurance authorization.  Follow Up Recommendations  Skilled nursing-short term rehab (<3 hours/day)     Assistance Recommended at Discharge Frequent or constant Supervision/Assistance  Patient can return home with the following A little help with walking and/or transfers;A little help with bathing/dressing/bathroom;Assistance with cooking/housework;Assist for transportation;Help with stairs or ramp for entrance;Direct supervision/assist for medications management;Direct supervision/assist for financial management   Equipment Recommendations  Rolling walker (2 wheels)    Recommendations for Other Services       Precautions / Restrictions Precautions Precautions: Fall Precaution Comments: fearful of falling     Mobility  Bed Mobility Overal bed mobility: Needs Assistance Bed Mobility: Supine to Sit     Supine to  sit: Mod assist, HOB elevated     General bed mobility comments: +rail, increased time, max cues for encouragement    Transfers Overall transfer level: Needs assistance Equipment used: 2 person hand held assist Transfers: Sit to/from Stand, Bed to chair/wheelchair/BSC Sit to Stand: Min assist, +2 physical assistance, +2 safety/equipment   Step pivot transfers: Min assist, +2 physical assistance, +2 safety/equipment       General transfer comment: Pt refusing use of RW. +2 HHA to power up and pivot steps to [facility].    Ambulation/Gait Ambulation/Gait assistance: +2 physical assistance, Min assist Gait Distance (Feet): 20 Feet Assistive device: 2 person hand held assist Gait Pattern/deviations: Step-through pattern, Decreased stride length, Shuffle Gait velocity: very slow Gait velocity interpretation: <1.8 ft/sec, indicate of risk for recurrent falls   General Gait Details: short, shuffle steps. Therapist and pt's husband providing HHA. Frequent stops. Continuous encouragement to participate.   Stairs             Wheelchair Mobility    Modified Rankin (Stroke Patients Only)       Balance Overall balance assessment: Needs assistance Sitting-balance support: No upper extremity supported, Feet supported Sitting balance-Leahy Scale: Fair     Standing balance support: Bilateral upper extremity supported, Single extremity supported, During functional activity Standing balance-Leahy Scale: Poor Standing balance comment: reliant on external support                            Cognition Arousal/Alertness: Awake/alert Behavior During Therapy: Flat affect Overall Cognitive Status: Difficult to assess  General Comments: Very flat. Minimal interaction with therapist. Avoiding eye contact. Will answer yes/no only. Husband present in room, providing encouragement and answering for pt.        Exercises       General Comments General comments (skin integrity, edema, etc.): VSS on RA      Pertinent Vitals/Pain Pain Assessment Pain Assessment: No/denies pain    Home Living                          Prior Function            PT Goals (current goals can now be found in the care plan section) Acute Rehab PT Goals Patient Stated Goal: home Progress towards PT goals: Progressing toward goals    Frequency    Min 3X/week      PT Plan Current plan remains appropriate    Co-evaluation              AM-PAC PT "6 Clicks" Mobility   Outcome Measure  Help needed turning from your back to your side while in a flat bed without using bedrails?: A Little Help needed moving from lying on your back to sitting on the side of a flat bed without using bedrails?: A Lot Help needed moving to and from a bed to a chair (including a wheelchair)?: A Lot Help needed standing up from a chair using your arms (e.g., wheelchair or bedside chair)?: A Little Help needed to walk in hospital room?: A Lot Help needed climbing 3-5 steps with a railing? : Total 6 Click Score: 13    End of Session Equipment Utilized During Treatment: Gait belt Activity Tolerance: Patient tolerated treatment well Patient left: in chair;with call bell/phone within reach;with chair alarm set Nurse Communication: Mobility status PT Visit Diagnosis: Unsteadiness on feet (R26.81);Muscle weakness (generalized) (M62.81);Other symptoms and signs involving the nervous system (P53.614)     Time: 4315-4008 PT Time Calculation (min) (ACUTE ONLY): 32 min  Charges:  $Gait Training: 8-22 mins $Therapeutic Activity: 8-22 mins                     Lorrin Goodell, PT  Office # [phone]     Lorriane Shire 03/10/2022, 9:43 AM

## 2022-03-10 NOTE — Plan of Care (Signed)

## 2022-03-10 NOTE — Progress Notes (Signed)
PROGRESS NOTE        PATIENT DETAILS Name: Tara [name] Age: 76 y.o. Sex: female Date of Birth: [birth year] Admit Date: 02/27/2022 Admitting Physician Julian Hy, DO RUE:[id number], Norwood Levo, MD  Brief Summary: Patient is a 76 y.o.  female with a history of HTN, HLD, CKD stage IIIb, chronic HFpEF, meningioma, breast CA-s/p right lumpectomy and chemoradiation in 2008, depression/anxiety who presented with weakness/fatigue/confusion/slurred speech-she was found to have AKI-and initially admitted to the ICU for possible RRT.  However with supportive care-renal function improved-she was subsequently transferred to the [facility] hospitalist service on 6/11.  Further hospital course was complicated by development of A-fib RVR along with hypotension-necessitating transfer to the ICU again.  She was started on amiodarone infusion-she converted to sinus rhythm and was transferred back to [facility] on 6/14.  Significant events: 6/08>> presented to ED with weakness/fatigue/confusion/slurred sleep-AKI-meningioma.  Admitted by PCCM to ICU 6/11>> transfer to [facility] 6/12>> A-fib RVR with hypotension.  Amiodarone infusion started.  Transferred to ICU-subsequently converted to sinus rhythm. 6/14>> transfer to [facility]  Significant studies: 6/08>> CT head: Interval increase in size of previously noted planum sphenoidal meningioma. 6/08>> CT C-spine: No fracture or dislocation 6/08>> CXR: No cardiopulmonary disease. 6/08>> renal ultrasound: No hydronephrosis. 6/09>> Echo: EF 55% 6/12>> TSH: 0.429 6/13>> CXR: No PNA  Significant microbiology data: 6/08>> influenza/COVID PCR: Negative 6/13>> blood culture: Negative  Procedures: None  Consults: Nephrology, PCCM, cardiology  Subjective:  Patient in bed, appears comfortable, denies any headache, no fever, no chest pain or pressure, no shortness of breath , no abdominal pain. No new focal weakness.  Objective: Vitals: Blood pressure (!)  115/55, pulse 65, temperature 98.4 F (36.9 C), temperature source Oral, resp. rate 16, height '4\' 9"'$  (1.448 m), weight 85.3 kg, SpO2 92 %.   Exam:  Awake Alert, No new F.N deficits, Normal affect Alpha.AT,PERRAL Supple Neck, No JVD,   Symmetrical Chest wall movement, Good air movement bilaterally, CTAB RRR,No Gallops, Rubs or new Murmurs,  +ve B.Sounds, Abd Soft, No tenderness,   No Cyanosis, Clubbing or edema    Assessment/Plan:  AKI on CKD stage IIIb, baseline creatinine 1.7: AKI hemodynamically mediated in the setting of hypotension/discontinued Entresto, Aldactone and Jardiance, still hypotensive, IV fluids on 03/09/2021 and monitor  HTN >> now Hypotension.  Hold diuretics, beta-blocker and Entresto, hydrated on 03/09/2022 and monitor.  Blood pressure is improved & she is feeling better.  A-fib with RVR hypotension: Maintaining sinus rhythm-BP slightly on the softer side and heart rate in low 50s, beta-blocker stopped, amiodarone dose cut down.  Monitor on telemetry.  Already informed to review on 03/10/2022.  Meningioma possible vasogenic edema: Slurred speech has resolved-Decadron.  Discussed with neurosurgeon-Dr. Dawley-recommendations are to pursue possible craniotomy/resection in the outpatient setting-recommends that taper Decadron to off in 2 weeks.  Weakness and deconditioning.  PT OT, now agreeable to going to SNF.  Likely 03/11/2022.  Normocytic anemia: Due to CKD-worsened due to AKI and IVF administration.  No indications for transfusion-watch closely.  Hyponatremia.  Mild dehydration, improved with IV fluids, monitor intermittently.    Leukocytosis: Suspect due to steroids-UA/chest x-ray/blood cultures negative for infection.  Will started on empiric cefepime while in the ICU-since no evidence of infection-we will discontinue and monitor off antimicrobial therapy.  Chronic HFpEF: Seen by cardiology, currently dehydrated clinically.  HLD: On Crestor.  Prediabetes (A1c  6.3  on 6/8)-with steroid-induced hyperglycemia: CBG stable on SSI and 10 units of Semglee  Recent Labs    03/09/22 2022 03/09/22 2359 03/10/22 0343  GLUCAP 146* 113* 118*    Depression/anxiety: Continue Zoloft/Klonopin.  GERD: Continue PPI  Constipation: Resolved.  Morbid Obesity: Estimated body mass index is 40.69 kg/m as calculated from the following:   Height as of this encounter: '4\' 9"'$  (1.448 m).   Weight as of this encounter: 85.3 kg.   Code status:   Code Status: Full Code   DVT Prophylaxis:  Place TED hose Start: 03/09/22 1012 Place TED hose Start: 03/09/22 0636 apixaban (ELIQUIS) tablet 2.5 mg Start: 03/04/22 2200 SCDs Start: 02/27/22 2010 apixaban (ELIQUIS) tablet 2.5 mg    Family Communication:   Spouse at bedside on 03/07/2022, 03/08/2022, 03/09/2022  Daughter [phone] on 03/09/22   Disposition Plan:  Status is: Inpatient Remains inpatient appropriate because: Decreasing leukocytosis-CHF meds being restarted-SNF hopefully in the next 1-2 days.   Planned Discharge Destination:Skilled nursing facility  The patient is critically ill with multiple organ system failure and requires high complexity decision making for assessment and support, frequent evaluation and titration of therapies, advanced monitoring, review of radiographic studies and interpretation of complex data.     Diet: Diet Order             Diet heart healthy/carb modified Room service appropriate? Yes with Assist; Fluid consistency: Thin; Fluid restriction: 1200 mL Fluid  Diet effective now                    MEDICATIONS: Scheduled Meds:  amiodarone  200 mg Oral Daily   apixaban  2.5 mg Oral BID   clonazePAM  0.5 mg Oral QHS   dexamethasone  2 mg Oral Q12H   insulin aspart  0-9 Units Subcutaneous Q4H   insulin glargine-yfgn  10 Units Subcutaneous QHS   lidocaine  1 patch Transdermal Q24H   midodrine  5 mg Oral BID WC   montelukast  10 mg Oral QHS   pantoprazole  40 mg Oral  Q1200   rosuvastatin  10 mg Oral Daily   senna  2 tablet Oral Daily   Continuous Infusions:  sodium chloride Stopped (03/04/22 1818)   PRN Meds:.sodium chloride, acetaminophen, diltiazem, loperamide, melatonin, ondansetron (ZOFRAN) IV   I have personally reviewed following labs and imaging studies  LABORATORY DATA:  Recent Labs  Lab 03/05/22 0100 03/06/22 0302 03/07/22 0211 03/08/22 0132 03/10/22 0340  WBC 35.4* 25.3* 25.7* 20.0* 18.2*  HGB 8.4* 8.3* 8.3* 8.6* 9.1*  HCT 25.1* 24.2* 25.8* 26.1* 26.7*  PLT 201 125* 227 196 196  MCV 84.2 83.7 85.4 85.6 84.5  MCH 28.2 28.7 27.5 28.2 28.8  MCHC 33.5 34.3 32.2 33.0 34.1  RDW 16.4* 16.2* 16.0* 16.4* 16.0*  LYMPHSABS  --   --   --   --  1.1  MONOABS  --   --   --   --  1.2*  EOSABS  --   --   --   --  0.0  BASOSABS  --   --   --   --  0.0    Recent Labs  Lab 03/03/22 1007 03/03/22 1054 03/03/22 1340 03/04/22 0605 03/04/22 0605 03/05/22 0100 03/06/22 0302 03/07/22 0211 03/08/22 0132 03/09/22 0136 03/10/22 0340  NA  --   --   --  135   < > 132* 132* 133* 129* 131* 130*  K  --   --   --  4.3   < > 4.1 4.5 4.4 4.4 4.2 4.6  CL  --   --   --  101   < > 101 101 103 98 98 100  CO2  --   --   --  25   < > 22 21* '23 22 22 23  '$ GLUCOSE  --   --   --  118*   < > 129* 97 91 99 108* 113*  BUN  --   --   --  35*   < > 38* 42* 44* 43* 50* 40*  CREATININE  --   --   --  1.65*   < > 1.56* 1.53* 1.74* 1.58* 1.80* 1.66*  CALCIUM  --   --   --  8.5*   < > 8.4* 8.1* 8.6* 8.2* 8.5* 8.4*  AST  --   --   --  43*  --   --   --   --   --   --  18  ALT  --   --   --  28  --   --   --   --   --   --  31  ALKPHOS  --   --   --  46  --   --   --   --   --   --  46  BILITOT  --   --   --  0.8  --   --   --   --   --   --  0.5  ALBUMIN  --   --   --  2.6*  --   --   --   --   --   --  2.5*  MG 1.7  --   --  2.3  --  2.0  --   --   --   --  2.0  PHOS  --   --   --   --   --  3.6  --   --   --   --   --   LATICACIDVEN  --  1.9 1.7  --   --   --    --   --   --   --   --    < > = values in this interval not displayed.    RADIOLOGY STUDIES/RESULTS: No results found.   LOS: 11 days   Signature  Lala Lund M.D on 03/10/2022 at 10:06 AM   -  To page go to [email or web address]

## 2022-03-11 DIAGNOSIS — I4891 Unspecified atrial fibrillation: Secondary | ICD-10-CM | POA: Diagnosis not present

## 2022-03-11 DIAGNOSIS — I5022 Chronic systolic (congestive) heart failure: Secondary | ICD-10-CM | POA: Diagnosis not present

## 2022-03-11 DIAGNOSIS — N179 Acute kidney failure, unspecified: Secondary | ICD-10-CM | POA: Diagnosis not present

## 2022-03-11 LAB — GLUCOSE, CAPILLARY
Glucose-Capillary: 101 mg/dL — ABNORMAL HIGH (ref 70–99)
Glucose-Capillary: 100 mg/dL — ABNORMAL HIGH (ref 70–99)
Glucose-Capillary: 167 mg/dL — ABNORMAL HIGH (ref 70–99)
Glucose-Capillary: 109 mg/dL — ABNORMAL HIGH (ref 70–99)

## 2022-03-11 NOTE — Therapy (Signed)
OT Cancellation Note  Patient Details Name: Tara [name] MRN: [id number] DOB: 26-May-1946   Cancelled Treatment:    Reason Eval/Treat Not Completed: Patient declined, no reason specified;Other (comment) (reports her back is hurting and she just doesn't want to, husband and OT trying to encourage pt but continued to refuse)  Tara [name] Tara [name] 03/11/2022, 4:04 PM

## 2022-03-11 NOTE — Progress Notes (Addendum)
[facility] Progress note  Progress Note  Patient Name: Tara [name] Date of Encounter: 03/11/2022  [facility] Cardiologist: Dr. Aundra Dubin  Subjective   Still weak, no specific complaints  Inpatient Medications    Scheduled Meds:  amiodarone  200 mg Oral Daily   apixaban  2.5 mg Oral BID   clonazePAM  0.5 mg Oral QHS   dexamethasone  2 mg Oral Q12H   insulin aspart  0-6 Units Subcutaneous TID WC   insulin glargine-yfgn  10 Units Subcutaneous QHS   lidocaine  1 patch Transdermal Q24H   midodrine  5 mg Oral BID WC   montelukast  10 mg Oral QHS   pantoprazole  40 mg Oral Q1200   rosuvastatin  10 mg Oral Daily   senna  2 tablet Oral Daily   Continuous Infusions:  sodium chloride Stopped (03/04/22 1818)   PRN Meds: sodium chloride, acetaminophen, diltiazem, loperamide, melatonin, ondansetron (ZOFRAN) IV   Vital Signs    Vitals:   03/10/22 1000 03/10/22 1200 03/10/22 2015 03/11/22 0605  BP: (!) 115/55  118/60 (!) 111/53  Pulse: 65  70 65  Resp: 16 16 (!) 24 19  Temp: 98.4 F (36.9 C) 98.4 F (36.9 C) 98.7 F (37.1 C) 98.7 F (37.1 C)  TempSrc: Oral Oral Oral Oral  SpO2: 92%  95% 95%  Weight:    87.2 kg  Height:        Intake/Output Summary (Last 24 hours) at 03/11/2022 0803 Last data filed at 03/11/2022 7824 Gross per 24 hour  Intake 670 ml  Output 880 ml  Net -210 ml      03/11/2022    6:05 AM 03/10/2022    6:40 AM 03/09/2022    6:48 AM  Last 3 Weights  Weight (lbs) 192 lb 3.9 oz 188 lb 0.8 oz 190 lb 7.6 oz  Weight (kg) 87.2 kg 85.3 kg 86.4 kg      Telemetry    SR, S brady Personally Reviewed  Physical Exam   GEN: No acute distress, morbid obesity Neck: No JVD Cardiac: regular bradycardia, no murmurs, rubs, or gallops.  Respiratory: Clear to auscultation bilaterally. On room air.  GI: Soft, nontender MS: No leg edema Neuro:  Nonfocal  Psych: Normal affect   Labs    High Sensitivity Troponin:   Recent Labs  Lab 02/27/22 1438 02/27/22 2156  03/03/22 0816 03/03/22 1007  TROPONINIHS 26* 30* 26* 21*     Chemistry Recent Labs  Lab 03/05/22 0100 03/06/22 0302 03/08/22 0132 03/09/22 0136 03/10/22 0340  NA 132*   < > 129* 131* 130*  K 4.1   < > 4.4 4.2 4.6  CL 101   < > 98 98 100  CO2 22   < > '22 22 23  '$ GLUCOSE 129*   < > 99 108* 113*  BUN 38*   < > 43* 50* 40*  CREATININE 1.56*   < > 1.58* 1.80* 1.66*  CALCIUM 8.4*   < > 8.2* 8.5* 8.4*  MG 2.0  --   --   --  2.0  PROT  --   --   --   --  5.2*  ALBUMIN  --   --   --   --  2.5*  AST  --   --   --   --  18  ALT  --   --   --   --  31  ALKPHOS  --   --   --   --  46  BILITOT  --   --   --   --  0.5  GFRNONAA 34*   < > 34* 29* 32*  ANIONGAP 9   < > '9 11 7   '$ < > = values in this interval not displayed.    Lipids No results for input(s): "CHOL", "TRIG", "HDL", "LABVLDL", "LDLCALC", "CHOLHDL" in the last 168 hours.  Hematology Recent Labs  Lab 03/07/22 0211 03/08/22 0132 03/10/22 0340  WBC 25.7* 20.0* 18.2*  RBC 3.02* 3.05* 3.16*  HGB 8.3* 8.6* 9.1*  HCT 25.8* 26.1* 26.7*  MCV 85.4 85.6 84.5  MCH 27.5 28.2 28.8  MCHC 32.2 33.0 34.1  RDW 16.0* 16.4* 16.0*  PLT 227 196 196   Thyroid  No results for input(s): "TSH", "FREET4" in the last 168 hours.   BNP No results for input(s): "BNP", "PROBNP" in the last 168 hours.   DDimer No results for input(s): "DDIMER" in the last 168 hours.   Radiology    No results found.  Cardiac Studies   Echo from 02/28/22:    1. Left ventricular ejection fraction, by estimation, is 55%. The left  ventricle has normal function. The left ventricle has no regional wall  motion abnormalities. Left ventricular diastolic parameters are consistent  with Grade I diastolic dysfunction  (impaired relaxation).   2. Peak RV-RA gradient 25 mmHg. Right ventricular systolic function is  normal. The right ventricular size is normal.   3. The mitral valve is normal in structure. No evidence of mitral valve  regurgitation. No evidence of  mitral stenosis.   4. The aortic valve is tricuspid. There is mild calcification of the  aortic valve. Aortic valve regurgitation is not visualized. No aortic  stenosis is present.      Echo from 04/04/21:    1. EF improved since echo done 02/10/20 . Left ventricular ejection  fraction, by estimation, is 55%. The left ventricle has normal function.  The left ventricle has no regional wall motion abnormalities. Left  ventricular diastolic parameters were normal.  The average left ventricular global longitudinal strain is -21.7 %. The  global longitudinal strain is normal.   2. Right ventricular systolic function is normal. The right ventricular  size is normal. There is moderately elevated pulmonary artery systolic  pressure.   3. Left atrial size was mildly dilated.   4. The mitral valve is normal in structure. Trivial mitral valve  regurgitation. No evidence of mitral stenosis.   5. Tricuspid valve regurgitation is moderate.   6. The aortic valve is tricuspid. Aortic valve regurgitation is not  visualized. No aortic stenosis is present.   7. The inferior vena cava is normal in size with greater than 50%  respiratory variability, suggesting right atrial pressure of 3 mmHg.      Cardiac MRI from 08/10/17: IMPRESSION: 1.  Normal LV size with EF 49%, diffuse hypokinesis.   2.  Mildly dilated RV with normal systolic function.   3. No myocardial LGE, so no definitive evidence for prior MI, infiltrative disease, or myocarditis.   Right and left heart cath 07/10/17:   1. Mildly elevated PCWP, mild pulmonary hypertension.  2. Preserved cardiac output.  3. No significant coronary disease, nonischemic cardiomyopathy.      Patient Profile     Tara [name] is a 76 y.o. female with a PMH of anxiety with depression, breast cancer s/p right lumpectomy and chemoradiation 2008 and recurrence with right mastectomy and chemo 2019, hypertension, hyperlipidemia, meningioma, systolic heart  failure/nonischemic cardiomyopathy with recovered EF 03/2021, who presented to the ER 02/27/2022 with multiple complaints including slurred speech, confusion, shortness of breath, functional decline, decreased energy, admitted with AKI 2/2 pre-renal azotemia, leukocytosis of unclear etiology,increasing meningioma, cardiology is consulted on 03/03/2022 for the evaluation of A-fib with RVR and hypotension.   Assessment & Plan    New onset of A fib with RVR 03/03/22, paroxysmal  Hx of short PR SVT (remotely) Hypotension, resolved  Anemia - BP low initially limiting rate controlling agents converted to SR with IV amiodarone gtt and now transitioned to PO amiodarone  - amio stopped 06/17 due to hypotension, 200 mg qd started 06/19, continue this, remains in sinus  - Toprol XL d/c'd due to hypotension - CHA2DS2VASc 7, anticoagulation transitioned from heparin gtt to Eliquis, renally dosed, monitor closely for bleeding with existing meningoma    Chronic systolic heart failure Non-ischemic cardiomyopathy Morbid Obesity HTN AKI on CKD IIIb - LVEF has recovered since 04/04/21 Echo to 55%  - right and left heart cath 2018 no CAD - cardiac MRI 2018 no LGE - GDMT:  BP did not tolerate Toprol XL 50 mg and Entresto 24-26 mg bid -  Na+ still low but stable now, renal index within her baseline now - currently off all GDMT, including Jardiance 10 mg  - not on diuretic, BUN/Cr trending down - SBP 110s on midodrine 5 mg bid  Meningioma Leukocytosis HLD Pre-diabetes Depression/anxiety Debility  Hx of breast cancer - per hospitalist    Has outpatient follow up. MD advise if we sign off today  For questions or updates, please contact [place] Please consult [email or web address] for contact info under      Signed, Rosaria Ferries, PA-C  03/11/2022, 8:03 AM    History and all data above reviewed.  Patient examined.  I agree with the findings as above.   Weak but no acute complaints today.  Denies pain or  SOB.   The patient exam reveals COR:RRR  ,  Lungs: Clear  ,  Abd: Positive bowel sounds, no rebound no guarding, Ext Mild edema  .  All available labs, radiology testing, previous records reviewed. Agree with documented assessment and plan.   Chronic diastolic HF:  Seems to be euvolemic.  No change in therapy.  Not able to titrate any meds although the BP is creeping up.    Atrial fib:  Now in NSR.  Continue amio and Eliquis.  No change in therapy.  We will follow as needed.     Tara [name]  10:05 AM  03/11/2022

## 2022-03-11 NOTE — Evaluation (Signed)
Clinical/Bedside Swallow Evaluation Patient Details  Name: Tara [name] MRN: [id number] Date of Birth: [birth year]  Today's Date: 03/11/2022 Time: SLP Start Time (ACUTE ONLY): 0840 SLP Stop Time (ACUTE ONLY): 0908 SLP Time Calculation (min) (ACUTE ONLY): 28 min  Past Medical History:  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   Anxiety    Breast cancer ([place]) 2008   right - s/p lumpectomy->chemo, radiation   Depression    Dysrhythmia    hx SVT   Family history of colon cancer    Family history of prostate cancer    GERD (gastroesophageal reflux disease)    H/O: hysterectomy    Hiatal hernia    History of cancer chemotherapy    History of radiation therapy    Hyperlipidemia    Hypertension 01/25/2018   Nonischemic cardiomyopathy ([place])    Personal history of radiation therapy 01/05/209   SVT (supraventricular tachycardia) ([place])    short RP SVT documented 0/86   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[place])    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)   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   Past Surgical History:  Past Surgical History:  Procedure Laterality Date   ABDOMINAL HYSTERECTOMY  1994   fibroids,    BIOPSY  06/19/2016   Procedure: BIOPSY;  Surgeon: Daneil Dolin, MD;  [facility];  Service: Endoscopy;;  gastric duodenum   BREAST BIOPSY Right 12/16/2006   malignant   BREAST EXCISIONAL BIOPSY Right 2018   benign lumpectomy   BREAST LUMPECTOMY Right 02/2007   BREAST LUMPECTOMY WITH RADIOACTIVE SEED LOCALIZATION Right 07/29/2017   Procedure: RIGHT BREAST LUMPECTOMY WITH RADIOACTIVE SEED LOCALIZATION;  Surgeon: Fanny Skates, MD;  Location: [place] [facility];  Service: General;  Laterality: Right;   BREAST SURGERY Right 2008   lumpectomy, cancer   CARDIAC CATHETERIZATION     CHOLECYSTECTOMY  1999   COLONOSCOPY  2008   Dr. Oneida Alar: multiple polyps.  Path not available at time of visit.    COLONOSCOPY WITH PROPOFOL N/A 04/25/2014   Dr. Oneida Alar: Multiple tubular adenomas removed. Diverticulosis. Moderate internal hemorrhoids. Next colonoscopy planned for August 2018.   ESOPHAGOGASTRODUODENOSCOPY (EGD) WITH PROPOFOL N/A 06/19/2016   Procedure: ESOPHAGOGASTRODUODENOSCOPY (EGD) WITH PROPOFOL;  Surgeon: Daneil Dolin, MD;  [facility];  Service: Endoscopy;  Laterality: N/A;   MASTECTOMY W/ SENTINEL NODE BIOPSY Right 06/09/2018   Procedure: RIGHT TOTAL MASTECTOMY WITH SENTINEL LYMPH NODE BIOPSY;  Surgeon: Fanny Skates, MD;  Location: [facility];  Service: General;  Laterality: Right;   POLYPECTOMY N/A 04/25/2014   Procedure: POLYPECTOMY;  Surgeon: Danie Binder, MD;  Location: [facility];  Service: Endoscopy;  Laterality: N/A;  Ascending and Decending Colon x3 , Transverse colon x2, rectal   PORTACATH PLACEMENT N/A 06/09/2018   Procedure: INSERTION PORT-A-CATH;  Surgeon: Fanny Skates, MD;  Location: [facility] [place];  Service: General;  Laterality: N/A;   RIGHT/LEFT HEART CATH AND CORONARY ANGIOGRAPHY N/A 07/10/2017   Procedure: RIGHT/LEFT HEART CATH AND CORONARY ANGIOGRAPHY;  Surgeon: Larey Dresser, MD;  Location: [facility];  Service: Cardiovascular;  Laterality: N/A;   HPI:  Patient is a 76 y.o.  female with a history of HTN, HLD, CKD stage IIIb, chronic HFpEF, meningioma, breast CA-s/p right lumpectomy and chemoradiation in 2008, depression/anxiety who presented with weakness/fatigue/confusion/slurred speech-she was found to have AKI-and  initially admitted to the ICU for possible RRT.  However with supportive care-renal function improved-she was subsequently transferred to [facility] on 6/11.  Further hospital course was complicated by development of A-fib RVR along with hypotension-necessitating transfer to the ICU again.  She was started on amiodarone infusion-she converted to sinus rhythm and was transferred back to [facility] on  6/14.Pt was eating well until 6/17 when she had some vomiting. She ate well on 6/18, but on 8/19 started holding her pills and refusing to eat and drink.    Assessment / Plan / Recommendation  Clinical Impression  Pt demonstrates flat affect, slow, delayed responses, though mostly accurate and appropriate. Pt has difficulty explaining why she doesnt want to eat or drink, cannot given a specific complaint. Pt demosntrated ability to swallow thin liquids and masticate and swallow solids without difficulty, no oral holding observed. Pt did not want more than a bite or two and said food tasted bad. Pt does request a sausage dog. MD confirmed that pt could have an unrestricted diet. SLP will resume regular soldis and thin liquids to offer pt meals. 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 She may need some extra stimulation prior to PO, like moving to chair or a conversation about what is happening, to increase her awareness. No SLP f/u needed however given adequate function. WIll sign off. SLP Visit Diagnosis: Dysphagia, oropharyngeal phase (R13.12)    Aspiration Risk       Diet Recommendation Regular;Thin liquid   Liquid Administration via: Cup;Straw Medication Administration: Whole meds with liquid Supervision: Staff to assist with self feeding Compensations: Minimize environmental distractions Postural Changes: Seated upright at 90 degrees    Other  Recommendations Oral Care Recommendations: Oral care BID    Recommendations for follow up therapy are one component of a multi-disciplinary discharge planning process, led by the attending physician.  Recommendations may be updated based on patient status, additional functional criteria and insurance authorization.  Follow up Recommendations No SLP follow up      Assistance Recommended at Discharge    Functional Status Assessment    Frequency and Duration            Prognosis        Swallow Study    General HPI: Patient is a 76 y.o.  female with a history of HTN, HLD, CKD stage IIIb, chronic HFpEF, meningioma, breast CA-s/p right lumpectomy and chemoradiation in 2008, depression/anxiety who presented with weakness/fatigue/confusion/slurred speech-she was found to have AKI-and initially admitted to the ICU for possible RRT.  However with supportive care-renal function improved-she was subsequently transferred to the [facility] on 6/11.  Further hospital course was complicated by development of A-fib RVR along with hypotension-necessitating transfer to the ICU again.  She was started on amiodarone infusion-she converted to sinus rhythm and was transferred back to [facility] on 6/14.Pt was eating well until 6/17 when she had some vomiting. She ate well on 6/18, but on 8/19 started holding her pills and refusing to eat and drink. Type of Study: Bedside Swallow Evaluation Diet Prior to this Study: NPO Temperature Spikes Noted: No Respiratory Status: Room air History of Recent Intubation: No Behavior/Cognition: Alert;Cooperative;Requires cueing Oral Cavity Assessment: Within Functional Limits Oral Care Completed by SLP: No Oral Cavity - Dentition: Adequate natural dentition Vision: Functional for self-feeding Self-Feeding Abilities: Able to feed self Patient Positioning: Upright in bed Baseline Vocal Quality: Normal Volitional Cough: Strong Volitional Swallow:  Able to elicit    Oral/Motor/Sensory Function Overall Oral Motor/Sensory Function: Within functional limits   Ice Chips     Thin Liquid Thin Liquid: Within functional limits Presentation: Cup;Straw    Nectar Thick Nectar Thick Liquid: Not tested   Honey Thick Honey Thick Liquid: Not tested   Puree Puree: Within functional limits   Solid     Solid: Within functional limits      Tara [name], [name] 03/11/2022,9:56 AM

## 2022-03-11 NOTE — Care Management Important Message (Signed)
Important Message  Patient Details  Name: Tara [name] MRN: [id number] Date of Birth: Apr 02, 1946   Medicare Important Message Given:  Yes     Shelda Altes 03/11/2022, 9:17 AM

## 2022-03-11 NOTE — Progress Notes (Signed)
PROGRESS NOTE        PATIENT DETAILS Name: Tara [name] Age: 76 y.o. Sex: female Date of Birth: 04-26-1946 Admit Date: 02/27/2022 Admitting Physician Julian Hy, DO BHA:[id number], Norwood Levo, MD  Brief Summary: Patient is a 76 y.o.  female with a history of HTN, HLD, CKD stage IIIb, chronic HFpEF, meningioma, breast CA-s/p right lumpectomy and chemoradiation in 2008, depression/anxiety who presented with weakness/fatigue/confusion/slurred speech-she was found to have AKI-and initially admitted to the ICU for possible RRT.  However with supportive care-renal function improved-she was subsequently transferred to [facility] on 6/11.  Further hospital course was complicated by development of A-fib RVR along with hypotension-necessitating transfer to the ICU again.  She was started on amiodarone infusion-she converted to sinus rhythm and was transferred back to [facility] on 6/14.  Significant events: 6/08>> presented to ED with weakness/fatigue/confusion/slurred sleep-AKI-meningioma.  Admitted by PCCM to ICU 6/11>> transfer to [facility] 6/12>> A-fib RVR with hypotension.  Amiodarone infusion started.  Transferred to ICU-subsequently converted to sinus rhythm. 6/14>> transfer to [facility].  Significant studies: 6/08>> CT head: Interval increase in size of previously noted planum sphenoidal meningioma. 6/08>> CT C-spine: No fracture or dislocation 6/08>> CXR: No cardiopulmonary disease. 6/08>> renal ultrasound: No hydronephrosis. 6/09>> Echo: EF 55% 6/12>> TSH: 0.429 6/13>> CXR: No PNA  Significant microbiology data: 6/08>> influenza/COVID PCR: Negative 6/13>> blood culture: Negative  Procedures: None  Consults: Nephrology, PCCM, cardiology  Subjective:  Patient in bed, appears comfortable, denies any headache, no fever, no chest pain or pressure, no shortness of breath , no abdominal pain. No new focal weakness.   Objective: Vitals: Blood pressure  124/62, pulse 68, temperature 99.1 F (37.3 C), temperature source Oral, resp. rate (!) 25, height '4\' 9"'$  (1.448 m), weight 87.2 kg, SpO2 96 %.   Exam:  Awake Alert, No new F.N deficits, flat affect [place].AT,PERRAL Supple Neck, No JVD,   Symmetrical Chest wall movement, Good air movement bilaterally, CTAB RRR,No Gallops, Rubs or new Murmurs,  +ve B.Sounds, Abd Soft, No tenderness,   No Cyanosis, Clubbing or edema     Assessment/Plan:  AKI on CKD stage IIIb, baseline creatinine 1.7: AKI hemodynamically mediated in the setting of hypotension/discontinued Entresto, Aldactone and Jardiance, still hypotensive, IV fluids on 03/09/2021 and monitor  HTN >> now Hypotension.  Hold diuretics, beta-blocker and Entresto, hydrated on 03/09/2022 and monitor.  Blood pressure is improved & she is feeling better.  A-fib with RVR hypotension: Maintaining sinus rhythm-BP slightly on the softer side and heart rate in low 50s, beta-blocker stopped, amiodarone dose cut down.  Monitor on telemetry.  Already informed to review on 03/10/2022.  Meningioma possible vasogenic edema: Slurred speech has resolved-Decadron.  Discussed with neurosurgeon-Dr. Dawley-recommendations are to pursue possible craniotomy/resection in the outpatient setting-recommends that taper Decadron to off in 2 weeks.  Weakness and deconditioning.  PT OT, now agreeable to going to SNF.  Likely 03/11/2022.  Normocytic anemia: Due to CKD-worsened due to AKI and IVF administration.  No indications for transfusion-watch closely.  Hyponatremia.  Mild dehydration, improved with IV fluids, monitor intermittently.    Leukocytosis: Suspect due to steroids-UA/chest x-ray/blood cultures negative for infection.  Will started on empiric cefepime while in the ICU-since no evidence of infection-we will discontinue and monitor off antimicrobial therapy.  Family concerned that patient is having some issues with her swallowing, previous history  of esophageal  dilation.  Seen by speech on 03/11/2022 and cleared.  Regular diet.    Chronic HFpEF: Seen by cardiology, currently dehydrated clinically.  HLD: On Crestor.  Prediabetes (A1c 6.3 on 6/8)-with steroid-induced hyperglycemia: CBG stable on SSI and 10 units of Semglee  Recent Labs    03/10/22 1631 03/10/22 2131 03/11/22 0904  GLUCAP 92 138* 101*    Depression/anxiety: Continue Zoloft/Klonopin.  GERD: Continue PPI  Constipation: Resolved.  Morbid Obesity: Estimated body mass index is 41.6 kg/m as calculated from the following:   Height as of this encounter: '4\' 9"'$  (1.448 m).   Weight as of this encounter: 87.2 kg.   Code status:   Code Status: Full Code   DVT Prophylaxis:  Place TED hose Start: 03/09/22 1012 Place TED hose Start: 03/09/22 0636 apixaban (ELIQUIS) tablet 2.5 mg Start: 03/04/22 2200 SCDs Start: 02/27/22 2010 apixaban (ELIQUIS) tablet 2.5 mg    Family Communication:   Spouse at bedside on 03/07/2022, 03/08/2022, 03/09/2022  Daughter [phone] on 03/09/22   Disposition Plan:  Status is: Inpatient Remains inpatient appropriate because: Decreasing leukocytosis-CHF meds being restarted-SNF hopefully in the next 1-2 days.   Planned Discharge Destination:Skilled nursing facility  The patient is critically ill with multiple organ system failure and requires high complexity decision making for assessment and support, frequent evaluation and titration of therapies, advanced monitoring, review of radiographic studies and interpretation of complex data.     Diet: Diet Order             Diet regular Room service appropriate? Yes; Fluid consistency: Thin  Diet effective now                    MEDICATIONS: Scheduled Meds:  amiodarone  200 mg Oral Daily   apixaban  2.5 mg Oral BID   clonazePAM  0.5 mg Oral QHS   dexamethasone  2 mg Oral Q12H   insulin aspart  0-6 Units Subcutaneous TID WC   insulin glargine-yfgn  10 Units Subcutaneous QHS   lidocaine   1 patch Transdermal Q24H   midodrine  5 mg Oral BID WC   montelukast  10 mg Oral QHS   pantoprazole  40 mg Oral Q1200   rosuvastatin  10 mg Oral Daily   senna  2 tablet Oral Daily   Continuous Infusions:  sodium chloride Stopped (03/04/22 1818)   PRN Meds:.sodium chloride, acetaminophen, diltiazem, loperamide, melatonin, ondansetron (ZOFRAN) IV   I have personally reviewed following labs and imaging studies  LABORATORY DATA:  Recent Labs  Lab 03/05/22 0100 03/06/22 0302 03/07/22 0211 03/08/22 0132 03/10/22 0340  WBC 35.4* 25.3* 25.7* 20.0* 18.2*  HGB 8.4* 8.3* 8.3* 8.6* 9.1*  HCT 25.1* 24.2* 25.8* 26.1* 26.7*  PLT 201 125* 227 196 196  MCV 84.2 83.7 85.4 85.6 84.5  MCH 28.2 28.7 27.5 28.2 28.8  MCHC 33.5 34.3 32.2 33.0 34.1  RDW 16.4* 16.2* 16.0* 16.4* 16.0*  LYMPHSABS  --   --   --   --  1.1  MONOABS  --   --   --   --  1.2*  EOSABS  --   --   --   --  0.0  BASOSABS  --   --   --   --  0.0    Recent Labs  Lab 03/05/22 0100 03/06/22 0302 03/07/22 0211 03/08/22 0132 03/09/22 0136 03/10/22 0340  NA 132* 132* 133* 129* 131* 130*  K 4.1 4.5 4.4 4.4 4.2 4.6  CL 101 101 103 98 98 100  CO2 22 21* '23 22 22 23  '$ GLUCOSE 129* 97 91 99 108* 113*  BUN 38* 42* 44* 43* 50* 40*  CREATININE 1.56* 1.53* 1.74* 1.58* 1.80* 1.66*  CALCIUM 8.4* 8.1* 8.6* 8.2* 8.5* 8.4*  AST  --   --   --   --   --  18  ALT  --   --   --   --   --  31  ALKPHOS  --   --   --   --   --  46  BILITOT  --   --   --   --   --  0.5  ALBUMIN  --   --   --   --   --  2.5*  MG 2.0  --   --   --   --  2.0  PHOS 3.6  --   --   --   --   --     RADIOLOGY STUDIES/RESULTS: No results found.   LOS: 12 days   Signature  Lala Lund M.D on 03/11/2022 at 11:21 AM   -  To page go to [email or web address]

## 2022-03-11 NOTE — Progress Notes (Signed)
TRH night cross cover note:  CBG monitoring changed from Q4H (while in ICU) to qac and hs. Associated change also made to SSI coverage.      Babs Bertin, DO Hospitalist

## 2022-03-11 NOTE — TOC Progression Note (Signed)
Transition of Care [facility]) - Progression Note    Patient Details  Name: Tara [name] MRN: [id number] Date of Birth: September 01, 1946  Transition of Care [facility]) CM/SW [place], [place] Phone Number: 03/11/2022, 1:29 PM  Clinical Narrative:     CSW spoke with [facility] at [facility] who confirmed they can accept patient for SNF placement when medically ready. Insurance authorization has been approved. CSW informed MD.CSW will continue to follow and assist with patients dc planning needs.  Expected Discharge Plan: Skilled Nursing Facility Barriers to Discharge: Continued Medical Work up  Expected Discharge Plan and Services Expected Discharge Plan: [place] In-house Referral: Clinical Social Work Discharge Planning Services: CM Consult   Living arrangements for the past 2 months: Single Family Home                                       Social Determinants of Health (SDOH) Interventions    Readmission Risk Interventions    03/06/2022   12:36 PM  Readmission Risk Prevention Plan  Transportation Screening Complete  HRI or [place] Complete  Social Work Consult for [place] Planning/Counseling Complete

## 2022-03-11 NOTE — Progress Notes (Signed)
Pt seemed to have difficulty swallowing pills with applesauce and sips of water. Will make pt NPO and put in order for speech eval.

## 2022-03-12 ENCOUNTER — Encounter: Payer: Self-pay | Admitting: Hematology and Oncology

## 2022-03-12 ENCOUNTER — Other Ambulatory Visit (HOSPITAL_COMMUNITY): Payer: Self-pay

## 2022-03-12 DIAGNOSIS — N179 Acute kidney failure, unspecified: Secondary | ICD-10-CM | POA: Diagnosis not present

## 2022-03-12 LAB — GLUCOSE, CAPILLARY
Glucose-Capillary: 114 mg/dL — ABNORMAL HIGH (ref 70–99)
Glucose-Capillary: 103 mg/dL — ABNORMAL HIGH (ref 70–99)
Glucose-Capillary: 135 mg/dL — ABNORMAL HIGH (ref 70–99)
Glucose-Capillary: 100 mg/dL — ABNORMAL HIGH (ref 70–99)

## 2022-03-12 MED ORDER — DEXAMETHASONE 0.5 MG PO TABS
ORAL_TABLET | ORAL | 0 refills | Status: DC
  Filled 2022-03-12: qty 10, 6d supply, fill #0

## 2022-03-12 MED ORDER — AMIODARONE HCL 200 MG PO TABS
200.0000 mg | ORAL_TABLET | Freq: Every day | ORAL | 0 refills | Status: DC
  Filled 2022-03-12: qty 30, 30d supply, fill #0

## 2022-03-12 MED ORDER — DEXAMETHASONE 2 MG PO TABS
2.0000 mg | ORAL_TABLET | Freq: Every day | ORAL | Status: DC
  Administered 2022-03-13: 2 mg via ORAL
  Filled 2022-03-12: qty 1

## 2022-03-12 MED ORDER — APIXABAN 2.5 MG PO TABS
2.5000 mg | ORAL_TABLET | Freq: Two times a day (BID) | ORAL | 0 refills | Status: DC
  Filled 2022-03-12: qty 60, 30d supply, fill #0

## 2022-03-12 NOTE — Plan of Care (Signed)

## 2022-03-12 NOTE — Progress Notes (Signed)
Physical Therapy Treatment Patient Details Name: Tara [name] MRN: [id number] DOB: 04-18-1946 Today's Date: 03/12/2022   History of Present Illness 76 y/o female admitted 6/8 with weakness, confusion and progressive worsening of slurred speech.  CT head: increased size of known meningioma,   ARF found on work up.  6/12 A-fib RVR with hypotension.  PMHx: HTN, CHF, NiCM, Breast CA with chemo/fdx    PT Comments    Pt supine in bed on arrival.  She was quick to close her eyes and required increased time to communicate with PTA based on her behavior.  Ultimately she required direct cues to participate and this worked well for her.  After sitting edge of bed she was agreeable to ambulate in room.  Educated to perform small walks 3x daily to improve strength and function.  Pt remains fearful of falling.  Recommendations for follow up therapy are one component of a multi-disciplinary discharge planning process, led by the attending physician.  Recommendations may be updated based on patient status, additional functional criteria and insurance authorization.  Follow Up Recommendations  Skilled nursing-short term rehab (<3 hours/day)     Assistance Recommended at Discharge Frequent or constant Supervision/Assistance  Patient can return home with the following A little help with walking and/or transfers;A little help with bathing/dressing/bathroom;Assistance with cooking/housework;Assist for transportation;Help with stairs or ramp for entrance;Direct supervision/assist for medications management;Direct supervision/assist for financial management   Equipment Recommendations  Rolling walker (2 wheels)    Recommendations for Other Services       Precautions / Restrictions Precautions Precautions: Fall Precaution Comments: fearful of falling Restrictions Weight Bearing Restrictions: No     Mobility  Bed Mobility Overal bed mobility: Needs Assistance Bed Mobility: Supine to Sit     Supine  to sit: Mod assist     General bed mobility comments: Mod assistance to move into sitting edge of bed and patient was not participatory until seated edge of bed.    Transfers Overall transfer level: Needs assistance Equipment used: 2 person hand held assist Transfers: Sit to/from Stand Sit to Stand: Min assist, +2 physical assistance           General transfer comment: Cues for forward weight shifting and posture to rise into standing.    Ambulation/Gait Ambulation/Gait assistance: +2 physical assistance, Min assist Gait Distance (Feet): 20 Feet Assistive device: 2 person hand held assist Gait Pattern/deviations: Step-through pattern, Decreased stride length, Shuffle       General Gait Details: short, shuffle steps. Therapist and pt's husband providing HHA. Frequent stops. Continuous encouragement to participate.   Stairs             Wheelchair Mobility    Modified Rankin (Stroke Patients Only)       Balance Overall balance assessment: Needs assistance Sitting-balance support: No upper extremity supported, Feet supported Sitting balance-Leahy Scale: Fair Sitting balance - Comments: static sitting   Standing balance support: Bilateral upper extremity supported, Single extremity supported, During functional activity Standing balance-Leahy Scale: Poor Standing balance comment: reliant on external support                            Cognition Arousal/Alertness: Awake/alert Behavior During Therapy: Flat affect Overall Cognitive Status: Difficult to assess                                 General Comments: Continues to be  very flat. Minimal interaction with therapist. Avoiding eye contact. Will answer yes/no only. Husband present in room, providing encouragement and answering for pt.        Exercises      General Comments        Pertinent Vitals/Pain Pain Assessment Pain Assessment: Faces Faces Pain Scale: No hurt    Home  Living                          Prior Function            PT Goals (current goals can now be found in the care plan section) Acute Rehab PT Goals Patient Stated Goal: home Potential to Achieve Goals: Good Progress towards PT goals: Progressing toward goals    Frequency    Min 3X/week      PT Plan Current plan remains appropriate    Co-evaluation              AM-PAC PT "6 Clicks" Mobility   Outcome Measure  Help needed turning from your back to your side while in a flat bed without using bedrails?: A Little Help needed moving from lying on your back to sitting on the side of a flat bed without using bedrails?: A Lot Help needed moving to and from a bed to a chair (including a wheelchair)?: A Lot Help needed standing up from a chair using your arms (e.g., wheelchair or bedside chair)?: A Little Help needed to walk in hospital room?: A Lot Help needed climbing 3-5 steps with a railing? : Total 6 Click Score: 13    End of Session Equipment Utilized During Treatment: Gait belt Activity Tolerance: Patient tolerated treatment well Patient left: in chair;with call bell/phone within reach;with chair alarm set Nurse Communication: Mobility status PT Visit Diagnosis: Unsteadiness on feet (R26.81);Muscle weakness (generalized) (M62.81);Other symptoms and signs involving the nervous system (R29.898)     Time: 1137-1200 PT Time Calculation (min) (ACUTE ONLY): 23 min  Charges:  $Gait Training: 8-22 mins $Therapeutic Activity: 8-22 mins                     Molleigh Huot R. , PTA Acute Rehabilitation Services  Office [phone]    Labrenda Lasky Eli Hose 03/12/2022, 1:10 PM

## 2022-03-12 NOTE — Therapy (Signed)
Occupational Therapy Treatment Patient Details Name: Tara [name] MRN: [id number] DOB: 03/07/1946 Today's Date: 03/12/2022   History of present illness 76 y/o female admitted 6/8 with weakness, confusion and progressive worsening of slurred speech.  CT head: increased size of known meningioma,   ARF found on work up.  6/12 A-fib RVR with hypotension.  PMHx: HTN, CHF, NiCM, Breast CA with chemo/fdx   OT comments  76 yo female admitted for above detailed medical problems. She is received seated in bedside chair with husband at bedside. Pt with limited verbal communication with OT giving 1-2 word answers. Pt agreeable to toileting and getting dressed given plan for d/c later today. Pt is very self limiting and demonstrates fear of falling. Husband present and assisting with mobility. Pt benefited from 1-2 person min guard to min A. She needs mod A for LB dressing and max A for toileting hygiene on BSC. Husband reports he is available for assist at home. Continue to recommend HHOT to address indep with ADLs once d/c from hospital. Will follow if remains in house.    Recommendations for follow up therapy are one component of a multi-disciplinary discharge planning process, led by the attending physician.  Recommendations may be updated based on patient status, additional functional criteria and insurance authorization.    Follow Up Recommendations  Home health OT    Assistance Recommended at Discharge Frequent or constant Supervision/Assistance  Patient can return home with the following  A little help with walking and/or transfers;A little help with bathing/dressing/bathroom;Assistance with cooking/housework;Direct supervision/assist for medications management;Direct supervision/assist for financial management;Assist for transportation;Help with stairs or ramp for entrance   Equipment Recommendations  None recommended by OT    Recommendations for Other Services      Precautions / Restrictions  Precautions Precautions: Fall Precaution Comments: fearful of falling Restrictions Weight Bearing Restrictions: No       Mobility Bed Mobility      Transfers Overall transfer level: Needs assistance Equipment used: 2 person hand held assist Transfers: Sit to/from Stand Sit to Stand: Min assist, +2 physical assistance                     ADL either performed or assessed with clinical judgement   ADL                   Upper Body Dressing : Minimal assistance;Sitting;Min guard   Lower Body Dressing: Sit to/from stand;Maximal assistance;Moderate assistance   Toilet Transfer: Minimal assistance (hand held assist as pt refusing to use RW)           Functional mobility during ADLs: +2 for safety/equipment;Minimal assistance;Min guard        Cognition Arousal/Alertness: Awake/alert Behavior During Therapy: Flat affect Overall Cognitive Status: Difficult to assess                                 Pertinent Vitals/ Pain       Pain Assessment Pain Assessment: No/denies pain   Frequency  Min 2X/week        Progress Toward Goals  OT Goals(current goals can now be found in the care plan section)  Progress towards OT goals: Not progressing toward goals - comment (pt self limiting)  Acute Rehab OT Goals Patient Stated Goal: Return home OT Goal Formulation: With patient/family Potential to Achieve Goals: [place] Frequency remains appropriate;Discharge plan needs to be updated  AM-PAC OT "6 Clicks" Daily Activity     Outcome Measure   Help from another person eating meals?: None Help from another person taking care of personal grooming?: A Little Help from another person toileting, which includes using toliet, bedpan, or urinal?: A Lot Help from another person bathing (including washing, rinsing, drying)?: A Little Help from another person to put on and taking off regular upper body clothing?: A Little Help from another  person to put on and taking off regular lower body clothing?: A Lot 6 Click Score: 17    End of Session    OT Visit Diagnosis: Unsteadiness on feet (R26.81);Other abnormalities of gait and mobility (R26.89);Muscle weakness (generalized) (M62.81)   Activity Tolerance Patient tolerated treatment well   Patient Left in chair;with call bell/phone within reach;with chair alarm set;with family/visitor present   Nurse Communication Mobility status        Time: 2248-2500 OT Time Calculation (min): 24 min  Charges: OT General Charges $OT Visit: 1 Visit OT Treatments $Self Care/Home Management : 23-37 mins   Tara [name], OTR/L 03/12/2022, 3:07 PM

## 2022-03-12 NOTE — Progress Notes (Signed)
Pt requires a 3 in 1 as she has limited mobility and is unable to ambulate safely to her bathroom.

## 2022-03-12 NOTE — Discharge Summary (Addendum)
Tara [name] WJX:[id number] DOB: 16-May-1946 DOA: 02/27/2022  PCP: [name], MD  Admit date: 02/27/2022  Discharge date: 03/13/2022  Admitted From: Home   Disposition:  Home   Recommendations for Outpatient Follow-up:   Follow up with PCP in 1-2 weeks  PCP Please obtain BMP/CBC, 2 view CXR in 1week,  (see Discharge instructions)   PCP Please follow up on the following pending results: Monitor BMP, blood pressure, diuretic need, CBGs closely.  Needs close outpatient follow-up with neurosurgery and cardiology.   Home Health: PT, OT, RN if she qualifies Equipment/Devices: Environmental consultant Consultations: Nephrology, PCCM, cardiology Discharge Condition: Fair CODE STATUS: Full    Diet Recommendation: Heart Healthy low carbohydrate   Chief Complaint  Patient presents with   Aphasia     Brief history of present illness from the day of admission and additional interim summary    76 y.o.  female with a history of HTN, HLD, CKD stage IIIb, chronic HFpEF, meningioma, breast CA-s/p right lumpectomy and chemoradiation in 2008, depression/anxiety who presented with weakness/fatigue/confusion/slurred speech-she was found to have AKI-and initially admitted to the ICU for possible RRT.  However with supportive care-renal function improved-she was subsequently [facility].  Further hospital course was complicated by development of A-fib RVR along with hypotension-necessitating transfer to the ICU again.  She was started on amiodarone infusion-she converted to sinus rhythm and was transferred back to [facility] on 6/14.   Significant events: 6/08>> presented to ED with weakness/fatigue/confusion/slurred sleep-AKI-meningioma.  Admitted by PCCM to ICU 6/11>> transfer to [facility] 6/12>> A-fib RVR with  hypotension.  Amiodarone infusion started.  Transferred to ICU-subsequently converted to sinus rhythm. 6/14>> transfer to [facility].   Significant studies: 6/08>> CT head: Interval increase in size of previously noted planum sphenoidal meningioma. 6/08>> CT C-spine: No fracture or dislocation 6/08>> CXR: No cardiopulmonary disease. 6/08>> renal ultrasound: No hydronephrosis. 6/09>> Echo: EF 55% 6/12>> TSH: 0.429 6/13>> CXR: No PNA   Significant microbiology data: 6/08>> influenza/COVID PCR: Negative 6/13>> blood culture: Negative  Note patient was discharged on 03/12/2022 however husband could not reach home in time to obtain DME equipment.  She is symptom-free feeling good on 03/13/2022 and wants to go home.  She will be discharged home.                                                                 Hospital Course     AKI on CKD stage IIIb, baseline creatinine 1.7: AKI hemodynamically mediated in the setting of hypotension/discontinued Entresto, Aldactone, has been adequately hydrated with IV fluids blood pressure and renal function have improved, discharged home with close outpatient PCP follow-up with BMP and blood pressure monitoring, known home dose Jardiance have been resumed upon discharge.  PCP to monitor closely.  Renal  function close to baseline, continue to hold Aldactone upon discharge.  History of HTN >> had developed hypotension.  Her diuretics were held, have discontinued beta-blocker and Entresto, hydrated on 03/09/2022 with midodrine, blood pressure now improving will be discharged on no blood pressure medication with close outpatient PCP follow-up.  Midodrine has been discontinued as blood pressure has significantly improved.  We will continue to hold beta-blocker and Entresto upon discharge.  PCP to readdress the need for resuming beta-blocker and diuretic based on her exam and labs on the next visit.   A-fib with RVR hypotension [place] vas 2 score of greater than 3: Maintaining  sinus rhythm-BP slightly on the softer side and heart rate in low 50s, beta-blocker stopped, amiodarone dose cut down.  Stable to be discharged on low-dose amiodarone and Eliquis with outpatient cardiology follow-up.   Meningioma possible vasogenic edema: Slurred speech has resolved-Decadron.  Discussed with neurosurgeon-Dr. Dawley-recommendations are to pursue possible craniotomy/resection in the outpatient setting-recommends that taper Decadron to off in 2 weeks.  Follow-up with neurosurgery in 7 to 10 days postdischarge   Weakness and deconditioning.  PT OT, initially did not want to go to SNF, then she agreed to go to SNF, SNF bed was arranged on 03/12/2022 but now has changed her mind again and wants to go home with home PT, husband bedside agrees with the plan.   Normocytic anemia: Due to CKD-worsened due to AKI and IVF administration.  No indications for transfusion-watch closely.  Hyponatremia.  Mild dehydration, improved with IV fluids, PCP to recheck in 7 to 10 days postdischarge   Leukocytosis: Suspect due to steroids-UA/chest x-ray/blood cultures negative for infection.  Stable PCP to monitor.   Family concerned that patient is having some issues with her swallowing, previous history of esophageal dilation.  Seen by speech on 03/11/2022 and cleared.  Regular diet.     Chronic HFpEF: Seen by cardiology, currently dehydrated clinically.   HLD: On Crestor.  Depression/anxiety: Continue Zoloft/Klonopin.   GERD: Continue PPI   Constipation: Resolved.   Morbid Obesity: Estimated body mass index is 41.6, follow with PCP for weight loss.  Prediabetes (A1c 6.3 on 6/8)-with steroid-induced hyperglycemia: Continue home regimen.  Follow-up with PCP   Discharge diagnosis     Principal Problem:   Acute renal failure (ARF) ([place]) Active Problems:   Acute renal failure superimposed on stage 3b chronic kidney disease (HCC)   Metabolic acidosis   Meningioma, cerebral (HCC)    Leukocytosis   Chronic systolic CHF (congestive heart failure) (HCC)   Hypertension   Normocytic anemia   Anxiety and depression   GERD   Hyperlipidemia   Atrial fibrillation with RVR [facility])    Discharge instructions    Discharge Instructions     Discharge instructions   Complete by: As directed    Follow with Primary MD [name], MD in 7 days   Get CBC, CMP, 2 view Chest X ray -  checked next visit within 1 week by Primary MD    Activity: As tolerated with Full fall precautions use walker/cane & assistance as needed  Disposition Home    Diet: Heart Healthy low carbohydrate diet  Special Instructions: If you have smoked or chewed Tobacco  in the last 2 yrs please stop smoking, stop any regular Alcohol  and or any Recreational drug use.  On your next visit with your primary care physician please Get Medicines reviewed and adjusted.  Please request your Prim.MD to go over all Hospital Tests and  Procedure/Radiological results at the follow up, please get all Hospital records sent to your Prim MD by signing hospital release before you go home.  If you experience worsening of your admission symptoms, develop shortness of breath, life threatening emergency, suicidal or homicidal thoughts you must seek medical attention immediately by calling 911 or calling your MD immediately  if symptoms less severe.  You Must read complete instructions/literature along with all the possible adverse reactions/side effects for all the Medicines you take and that have been prescribed to you. Take any new Medicines after you have completely understood and accpet all the possible adverse reactions/side effects.   Increase activity slowly   Complete by: As directed        Discharge Medications   Allergies as of 03/13/2022       Reactions   Aspirin Other (See Comments)   Reports GI bleed--pt is currently taking   Lisinopril Cough   Sudafed [pseudoephedrine Hcl]    Pt reports she had  drainage in throat that made her throat hurt.         Medication List     STOP taking these medications    aspirin EC 81 MG tablet   Entresto 24-26 MG Generic drug: sacubitril-valsartan   hydrocortisone 2.5 % cream   hydrocortisone 2.5 % rectal cream Commonly known as: Proctozone-HC   metoCLOPramide 10 MG tablet Commonly known as: REGLAN   metoprolol succinate 50 MG 24 hr tablet Commonly known as: TOPROL-XL   spironolactone 25 MG tablet Commonly known as: ALDACTONE   UNABLE TO FIND       TAKE these medications    acetaminophen 500 MG tablet Commonly known as: TYLENOL Take 500-1,000 mg by mouth every 6 (six) hours as needed (FOR PAIN.).   amiodarone 200 MG tablet Commonly known as: PACERONE Take 1 tablet (200 mg total) by mouth daily.   azelastine 0.1 % nasal spray Commonly known as: ASTELIN Place 1 spray into both nostrils daily as needed for rhinitis. Use in each nostril as directed   clonazePAM 0.5 MG tablet Commonly known as: KLONOPIN Take 1 tablet (0.5 mg total) by mouth at bedtime.   dexamethasone 0.5 MG tablet Commonly known as: DECADRON Take 2 tabs for 3 days, then 1 tab for 3 days then stop.   docusate sodium 100 MG capsule Commonly known as: COLACE Take 200 mg by mouth 2 (two) times daily.   Eliquis 2.5 MG Tabs tablet Generic drug: apixaban Take 1 tablet (2.5 mg total) by mouth 2 (two) times daily.   Jardiance 10 MG Tabs tablet Generic drug: empagliflozin TAKE ONE TABLET BY MOUTH ONCE DAILY. What changed: how much to take   montelukast 10 MG tablet Commonly known as: SINGULAIR TAKE ONE TABLET BY MOUTH AT BEDTIME.   pantoprazole 40 MG tablet Commonly known as: PROTONIX TAKE (1) TABLET BY MOUTH TWICE DAILY. What changed: See the new instructions.   Preparation H 0.25-50 % Gel Generic drug: Phenylephrine-Witch Hazel Apply 1 Application topically daily as needed (hemorrhoid).   rosuvastatin 20 MG tablet Commonly known as:  Crestor Take 1 tablet (20 mg total) by mouth daily.   sertraline 25 MG tablet Commonly known as: ZOLOFT Take 1 tablet (25 mg total) by mouth daily.   Vitamin D 50 MCG (2000 UT) Caps Take 2,000 Units by mouth daily.               Durable Medical Equipment  (From admission, onward)  Start     Ordered   03/12/22 1052  For home use only DME 3 n 1  Once        03/12/22 1051   03/12/22 0947  For home use only DME Hospital bed  Once       Question Answer Comment  Length of Need 6 Months   The above medical condition requires: Patient requires the ability to reposition frequently   Head must be elevated greater than: 30 degrees   Bed type Semi-electric   Support Surface: Gel Overlay      03/12/22 0947   03/12/22 0901  For home use only DME Walker rolling  Once       Comments: 5 wheel  Question Answer Comment  Walker: With [place]   Patient needs a walker to treat with the following condition Weakness      03/12/22 0900             Follow-up Information     Ashok Pall, MD Follow up in 1 month(s).   Specialty: Neurosurgery Contact information: [address] [place] [address] [phone]         [facility] Follow up.   Specialty: Cardiology Why: Advanced Heart Failure clinic for transition of care follow-up on Friday Mar 21, 2022 at 9:30 AM. Please arrive 10-15 minutes early to check in. Contact information: [address] [id number] mc [place]        [name], MD. Schedule an appointment as soon as possible for a visit in 1 week(s).   Specialty: Family Medicine Contact information: [address], [place] [address] [phone]         [facility] Follow up.   Specialty: [place] Why: The home health agency will contact you for the first home visit Contact  information: [place] [address] [phone]                 Major procedures and Radiology Reports - PLEASE review detailed and final reports thoroughly  -       DG CHEST PORT 1 VIEW  Result Date: 03/04/2022 CLINICAL DATA:  Acute respiratory failure. EXAM: PORTABLE CHEST 1 VIEW COMPARISON:  02/27/2022 FINDINGS: Slightly improved cardiac contours. Redemonstrated calcified right mediastinal and hilar lymph nodes, likely sequela of prior granulomatous disease. No focal pulmonary opacity, pleural effusion, or pneumothorax. Surgical clips in the right axilla. No acute osseous abnormality. IMPRESSION: No acute cardiopulmonary process. Electronically Signed   By: Merilyn Baba M.D.   On: 03/04/2022 11:50   ECHOCARDIOGRAM COMPLETE  Result Date: 02/28/2022    ECHOCARDIOGRAM REPORT   Patient Name:   ZELENE BARGA Calzada Date of Exam: 02/28/2022 Medical Rec #:  [id number]      Height:       57.0 in Accession #:    [id number]     Weight:       164.9 lb Date of Birth:  08-07-1946      BSA:          1.657 m Patient Age:    34 years       BP:           132/70 mmHg Patient Gender: F              HR:           63 bpm. Exam  Location:  Inpatient Procedure: 2D Echo, Cardiac Doppler, Color Doppler and Intracardiac            Opacification Agent Indications:    CHF-acute systolic  History:        Patient has prior history of Echocardiogram examinations. Risk                 Factors:Hypertension and Dyslipidemia. DOE. GERD. Hx breast                 cancer.  Sonographer:    Clayton Lefort RDCS (AE) Referring Phys: [id number] [facility] M REESE  Sonographer Comments: Technically difficult study due to poor echo windows. Image acquisition challenging due to respiratory motion. Patient sleeping and unable to hold breath to reduce respiratory motion. IMPRESSIONS  1. Left ventricular ejection fraction, by estimation, is 55%. The left ventricle has normal function. The left ventricle has no regional wall motion  abnormalities. Left ventricular diastolic parameters are consistent with Grade I diastolic dysfunction (impaired relaxation).  2. Peak RV-RA gradient 25 mmHg. Right ventricular systolic function is normal. The right ventricular size is normal.  3. The mitral valve is normal in structure. No evidence of mitral valve regurgitation. No evidence of mitral stenosis.  4. The aortic valve is tricuspid. There is mild calcification of the aortic valve. Aortic valve regurgitation is not visualized. No aortic stenosis is present. FINDINGS  Left Ventricle: Left ventricular ejection fraction, by estimation, is 55%. The left ventricle has normal function. The left ventricle has no regional wall motion abnormalities. Definity contrast agent was given IV to delineate the left ventricular endocardial borders. The left ventricular internal cavity size was normal in size. There is no left ventricular hypertrophy. Left ventricular diastolic parameters are consistent with Grade I diastolic dysfunction (impaired relaxation). Right Ventricle: Peak RV-RA gradient 25 mmHg. The right ventricular size is normal. No increase in right ventricular wall thickness. Right ventricular systolic function is normal. Left Atrium: Left atrial size was normal in size. Right Atrium: Right atrial size was normal in size. Pericardium: There is no evidence of pericardial effusion. Mitral Valve: The mitral valve is normal in structure. No evidence of mitral valve regurgitation. No evidence of mitral valve stenosis. Tricuspid Valve: The tricuspid valve is normal in structure. Tricuspid valve regurgitation is trivial. Aortic Valve: The aortic valve is tricuspid. There is mild calcification of the aortic valve. Aortic valve regurgitation is not visualized. No aortic stenosis is present. Aortic valve mean gradient measures 3.0 mmHg. Aortic valve peak gradient measures 5.7 mmHg. Aortic valve area, by VTI measures 2.87 cm. Pulmonic Valve: The pulmonic valve was  normal in structure. Pulmonic valve regurgitation is not visualized. Aorta: The aortic root is normal in size and structure. Venous: The inferior vena cava was not well visualized. IAS/Shunts: No atrial level shunt detected by color flow Doppler.  LEFT VENTRICLE PLAX 2D LVIDd:         4.60 cm   Diastology LVIDs:         2.90 cm   LV e' medial:    9.32 cm/s LV PW:         0.90 cm   LV E/e' medial:  7.2 LV IVS:        1.00 cm   LV e' lateral:   10.30 cm/s LVOT diam:     2.20 cm   LV E/e' lateral: 6.5 LV SV:         70 LV SV Index:   42 LVOT Area:  3.80 cm  RIGHT VENTRICLE RV Basal diam:  3.40 cm RV S prime:     16.50 cm/s TAPSE (M-mode): 2.4 cm LEFT ATRIUM             Index        RIGHT ATRIUM           Index LA diam:        3.70 cm 2.23 cm/m   RA Area:     15.50 cm LA Vol (A2C):   38.4 ml 23.17 ml/m  RA Volume:   39.70 ml  23.96 ml/m LA Vol (A4C):   47.1 ml 28.42 ml/m LA Biplane Vol: 44.6 ml 26.92 ml/m  AORTIC VALVE AV Area (Vmax):    2.71 cm AV Area (Vmean):   2.66 cm AV Area (VTI):     2.87 cm AV Vmax:           119.00 cm/s AV Vmean:          77.500 cm/s AV VTI:            0.242 m AV Peak Grad:      5.7 mmHg AV Mean Grad:      3.0 mmHg LVOT Vmax:         84.90 cm/s LVOT Vmean:        54.300 cm/s LVOT VTI:          0.183 m LVOT/AV VTI ratio: 0.76  AORTA Ao Root diam: 2.90 cm Ao Asc diam:  2.90 cm MITRAL VALVE               TRICUSPID VALVE MV Area (PHT): 2.35 cm    TR Peak grad:   25.4 mmHg MV Decel Time: 323 msec    TR Vmax:        252.00 cm/s MV E velocity: 66.90 cm/s MV A velocity: 72.30 cm/s  SHUNTS MV E/A ratio:  0.93        Systemic VTI:  0.18 m                            Systemic Diam: 2.20 cm Dalton McleanMD Electronically signed by Franki Monte Signature Date/Time: 02/28/2022/3:47:32 PM    Final    US RENAL  Result Date: 02/27/2022 CLINICAL DATA:  Acute kidney injury EXAM: RENAL / URINARY TRACT ULTRASOUND COMPLETE COMPARISON:  None Available. FINDINGS: Right Kidney: Renal measurements: 8.9  x 3.8 x 3.6 cm = volume: 62.86 mL. Mildly increased renal cortical echogenicity. No mass or hydronephrosis visualized. Left Kidney: Renal measurements: 7.8 x 5.1 x 4.3 cm = volume: 87.75 mL. Mildly increased renal cortical echogenicity. No mass or hydronephrosis visualized. Bladder: Appears normal for degree of bladder distention. Other: None. IMPRESSION: No hydronephrosis.  Findings of medical renal disease. Electronically Signed   By: Maurine Simmering M.D.   On: 02/27/2022 18:44   DG Chest 2 View  Result Date: 02/27/2022 CLINICAL DATA:  Dyspnea on exertion. EXAM: CHEST - 2 VIEW COMPARISON:  Chest x-ray 06/09/2018. FINDINGS: Heart is mildly enlarged, unchanged. Calcified right mediastinal and right hilar lymph nodes are again seen. There is mild central pulmonary vascular congestion. There is no focal lung infiltrate, pleural effusion or pneumothorax. No acute fractures are seen. Right axillary surgical clips are present. IMPRESSION: 1. No acute cardiopulmonary process. 2. Mild cardiomegaly. 3. Old granulomatous disease. Electronically Signed   By: Ronney Asters M.D.   On: 02/27/2022 18:07   CT HEAD WO CONTRAST  Result Date: 02/27/2022 CLINICAL DATA:  Slurred speech weakness EXAM: CT HEAD WITHOUT CONTRAST TECHNIQUE: Contiguous axial images were obtained from the base of the skull through the vertex without intravenous contrast. RADIATION DOSE REDUCTION: This exam was performed according to the departmental dose-optimization program which includes automated exposure control, adjustment of the mA and/or kV according to patient size and/or use of iterative reconstruction technique. COMPARISON:  09/28/2016 FINDINGS: Brain: Redemonstrated planum sphenoidale meningioma, which is not well evaluated given the absence of intravenous contrast, which measures approximately 3.3 x 2.9 x 2.4 cm cm (AP x TR x CC) (series 6, image 30 and series 5, image 21), previously 3.4 x 2.5 x 2.1 cm on 09/28/2016 when remeasured similarly.  Hypodensity in the adjacent left frontal lobe (series 5, image 17 and series 3, image 12), which is favored to represent the sequela of remote infarct but could also represent edema associated with the mass. No evidence of acute infarction, hemorrhage, or midline shift. No hydrocephalus or extra-axial fluid collection. Vascular: No hyperdense vessel. Atherosclerotic calcifications in the intracranial carotid and vertebral arteries. Skull: Normal. Negative for fracture or focal lesion. Sinuses/Orbits: Mucosal thickening in the maxillary sinuses. The orbits are unremarkable. Other: The mastoid air cells are well aerated. IMPRESSION: 1. Interval increase in the size of a previously noted planum sphenoidale meningioma, with interval development of hypodensity in the inferior left frontal lobe, favored to represent sequela of remote infarct but possibly also representing edema related to the mass. An MRI with and without contrast is recommended for further evaluation. 2. No evidence of acute infarct. These results were called by telephone at the time of interpretation on 02/27/2022 at 3:51 pm to provider [place] , who verbally acknowledged these results. Electronically Signed   By: Merilyn Baba M.D.   On: 02/27/2022 15:51   CT Cervical Spine Wo Contrast  Result Date: 02/27/2022 CLINICAL DATA:  Neck trauma, after a fall off the bed) EXAM: CT CERVICAL SPINE WITHOUT CONTRAST TECHNIQUE: Multidetector CT imaging of the cervical spine was performed without intravenous contrast. Multiplanar CT image reconstructions were also generated. RADIATION DOSE REDUCTION: This exam was performed according to the departmental dose-optimization program which includes automated exposure control, adjustment of the mA and/or kV according to patient size and/or use of iterative reconstruction technique. COMPARISON:  None Available. FINDINGS: Alignment: Minimal retrolisthesis at C4-C5. Skull base and vertebrae: No acute fracture. No primary  bone lesion or focal pathologic process. Moderate cervical spondylosis with prominent marginal osteophytes, most severe at C4-C5. Posterior osteophyte ridge causes abutment of the ventral cervical cord. Soft tissues and spinal canal: No prevertebral fluid or swelling. No visible canal hematoma. Disc levels:  There is some narrowing of the disc space at C4-C5. Upper chest: Negative. Other: None. IMPRESSION: No fracture, subluxation or dislocation. Moderate cervical spondylosis most prominent at C4-C5 with minimal retrolisthesis, prominent marginal osteophytes and decrease in the height of the disc space. Electronically Signed   By: Frazier Richards M.D.   On: 02/27/2022 15:41      Today   Subjective    Allayah Raineri today has no headache,no chest abdominal pain,no new weakness tingling or numbness, feels much better wants to go home today.    Vitals  Blood pressure 136/78, pulse 67, temperature 98.8 F (37.1 C), temperature source Oral, resp. rate 18, height '4\' 9"'$  (1.448 m), weight 85 kg, SpO2 91 %.   Intake/Output Summary (Last 24 hours) at 03/13/2022 0824 Last data filed at 03/13/2022 0421 Gross per 24 hour  Intake 240 ml  Output 450 ml  Net -210 ml    Exam  Awake Alert, No new F.N deficits,    [place].AT,PERRAL Supple Neck,   Symmetrical Chest wall movement, Good air movement bilaterally, CTAB RRR,No Gallops,   +ve B.Sounds, Abd Soft, Non tender,  No Cyanosis, Clubbing or edema    Data Review   Recent Labs  Lab 03/07/22 0211 03/08/22 0132 03/10/22 0340  WBC 25.7* 20.0* 18.2*  HGB 8.3* 8.6* 9.1*  HCT 25.8* 26.1* 26.7*  PLT 227 196 196  MCV 85.4 85.6 84.5  MCH 27.5 28.2 28.8  MCHC 32.2 33.0 34.1  RDW 16.0* 16.4* 16.0*  LYMPHSABS  --   --  1.1  MONOABS  --   --  1.2*  EOSABS  --   --  0.0  BASOSABS  --   --  0.0    Recent Labs  Lab 03/07/22 0211 03/08/22 0132 03/09/22 0136 03/10/22 0340  NA 133* 129* 131* 130*  K 4.4 4.4 4.2 4.6  CL 103 98 98 100  CO2 '23 22 22 23   '$ GLUCOSE 91 99 108* 113*  BUN 44* 43* 50* 40*  CREATININE 1.74* 1.58* 1.80* 1.66*  CALCIUM 8.6* 8.2* 8.5* 8.4*  AST  --   --   --  18  ALT  --   --   --  31  ALKPHOS  --   --   --  46  BILITOT  --   --   --  0.5  ALBUMIN  --   --   --  2.5*  MG  --   --   --  2.0    Total Time in preparing paper work, data evaluation and todays exam - 35 minutes  Lala Lund M.D on 03/13/2022 at 8:24 AM  [facility]

## 2022-03-12 NOTE — TOC Transition Note (Signed)
Transition of Care [facility]) - CM/SW Discharge Note   Patient Details  Name: Tara [name] MRN: [id number] Date of Birth: January 06, 1946  Transition of Care [facility]) CM/SW Contact:  Pollie Friar, RN Phone Number: 03/12/2022, 11:24 AM   Clinical Narrative:    Patient and family have decided for her to discharge home with home health services and DME.  Walker/ 3 in 1/ hospital bed to be delivered to the home per [employer].  Home health arranged through: [facility] with information on the AVS. Cm has verified home address with spouse and they are requesting [facility] home once the bed has been delivered to the home.     Final next level of care: [place] Barriers to Discharge: No Barriers Identified   Patient Goals and CMS Choice Patient states their goals for this hospitalization and ongoing recovery are:: SNF CMS Medicare.gov Compare Post Acute Care list provided to:: Patient Represenative (must comment) Choice offered to / list presented to : Spouse  Discharge Placement                       Discharge Plan and Services In-house Referral: Clinical Social Work Discharge Planning Services: CM Consult            DME Arranged: 3-N-1, Walker rolling, Hospital bed DME Agency: [employer] Date DME Agency Contacted: 03/12/22   Representative spoke with at DME Agency: [place]: PT, OT, RN, Nurse's Aide          Social Determinants of Health (SDOH) Interventions     Readmission Risk Interventions    03/06/2022   12:36 PM  Readmission Risk Prevention Plan  Transportation Screening Complete  HRI or [place] Complete  Social Work Consult for [place] Planning/Counseling Complete

## 2022-03-12 NOTE — Progress Notes (Cosign Needed)
Length of Need 6 Months  °The above medical condition requires: Patient requires the ability to reposition frequently  °Head must be elevated greater than: 30 degrees  °Bed type Semi-electric  °Support Surface: Gel Overlay  ° °

## 2022-03-12 NOTE — Progress Notes (Signed)
Pt sats >89% RA. Will continue to monitor

## 2022-03-12 NOTE — TOC Progression Note (Signed)
Transition of Care [facility]) - Progression Note    Patient Details  Name: Tara [name] MRN: [id number] Date of Birth: April 02, 1946  Transition of Care [facility]) CM/SW [place], [place] Phone Number: 03/12/2022, 10:21 AM  Clinical Narrative:     CSW was informed by MD and CM patient has decided to go back home. CSW called patients insurance and cancelled patients insurance authorization for SNF placement. CSW informed Star with [place].   Expected Discharge Plan: [place] Barriers to Discharge: Continued Medical Work up  Expected Discharge Plan and Services Expected Discharge Plan: [place] In-house Referral: Clinical Social Work Discharge Planning Services: CM Consult   Living arrangements for the past 2 months: Single Family Home Expected Discharge Date: 03/12/22                                     Social Determinants of Health (SDOH) Interventions    Readmission Risk Interventions    03/06/2022   12:36 PM  Readmission Risk Prevention Plan  Transportation Screening Complete  HRI or [place] Complete  Social Work Consult for [place] Planning/Counseling Complete

## 2022-03-13 DIAGNOSIS — I4891 Unspecified atrial fibrillation: Secondary | ICD-10-CM | POA: Diagnosis not present

## 2022-03-13 LAB — GLUCOSE, CAPILLARY
Glucose-Capillary: 105 mg/dL — ABNORMAL HIGH (ref 70–99)
Glucose-Capillary: 85 mg/dL (ref 70–99)

## 2022-03-13 MED ORDER — FUROSEMIDE 40 MG PO TABS
60.0000 mg | ORAL_TABLET | Freq: Once | ORAL | Status: AC
  Administered 2022-03-13: 60 mg via ORAL
  Filled 2022-03-13: qty 1

## 2022-03-13 NOTE — TOC Progression Note (Signed)
Transition of Care [facility]) - Progression Note    Patient Details  Name: Tara [name] MRN: [id number] Date of Birth: 08/04/1946  Transition of Care [facility]) CM/SW Contact  Loletha Grayer Beverely Pace, RN Phone Number: 03/13/2022, 11:50 AM  Clinical Narrative:   Case Manager has arranged for [facility] to transport patient home .[facility] will arrive within next hour or so.  Medical Necessity form completed, Bedside RN notified and will update patient.  No further TOC needs identified.     Expected Discharge Plan: Skilled Nursing Facility Barriers to Discharge: No Barriers Identified  Expected Discharge Plan and Services Expected Discharge Plan: [place] In-house Referral: Clinical Social Work Discharge Planning Services: CM Consult   Living arrangements for the past 2 months: [place] Expected Discharge Date: 03/12/22               DME Arranged: [name] rolling, Hospital bed DME Agency: [employer] Date DME Agency Contacted: 03/12/22   Representative spoke with at DME Agency: [facility] HH Arranged: PT, OT, RN, Nurse's Aide           Social Determinants of Health (SDOH) Interventions    Readmission Risk Interventions    03/06/2022   12:36 PM  Readmission Risk Prevention Plan  Transportation Screening Complete  HRI or [place] Complete  Social Work Consult for [place] Planning/Counseling Complete

## 2022-03-13 NOTE — Progress Notes (Signed)
Dr. Candiss Norse notified that husband did not go home to let [facility] agency in with bed. Pt will remain in the hospital overnight until hospital bed arrives at home. Lenard Lance RN explained to husband he needed to stay at home for the delivery 6/22. Carroll Kinds RN

## 2022-03-13 NOTE — Plan of Care (Signed)

## 2022-03-13 NOTE — Progress Notes (Signed)
Physical Therapy Treatment Patient Details Name: Tara [name] MRN: [id number] DOB: 09/05/1946 Today's Date: 03/13/2022   History of Present Illness 76 y/o female admitted 6/8 with weakness, confusion and progressive worsening of slurred speech.  CT head: increased size of known meningioma,   ARF found on work up.  6/12 A-fib RVR with hypotension.  PMHx: HTN, CHF, NiCM, Breast CA with chemo/fdx    PT Comments    Pt remains self-limiting, refusing ambulation and with increased time to initiate all mobility. Pt requires physical assistance for all mobility at this time and remains at a high risk for falls. PT continues to recommend SNF, however pt refusing and electing to discharge home. PT will continue to follow.   Recommendations for follow up therapy are one component of a multi-disciplinary discharge planning process, led by the attending physician.  Recommendations may be updated based on patient status, additional functional criteria and insurance authorization.  Follow Up Recommendations  Skilled nursing-short term rehab (<3 hours/day) (pt refusing, will benefit from HHPT if pt agreeable) Can patient physically be transported by private vehicle: Yes   Assistance Recommended at Discharge Frequent or constant Supervision/Assistance  Patient can return home with the following A little help with bathing/dressing/bathroom;Assistance with cooking/housework;Assist for transportation;Help with stairs or ramp for entrance;Direct supervision/assist for medications management;Direct supervision/assist for financial management;A lot of help with walking and/or transfers   Equipment Recommendations  Rolling walker (2 wheels)    Recommendations for Other Services       Precautions / Restrictions Precautions Precautions: Fall Precaution Comments: fearful of falling Restrictions Weight Bearing Restrictions: No     Mobility  Bed Mobility Overal bed mobility: Needs Assistance Bed Mobility:  Supine to Sit     Supine to sit: Mod assist, HOB elevated     General bed mobility comments: use of hand hold, PT assists in pivoting hips and initiating movement of LEs    Transfers Overall transfer level: Needs assistance Equipment used: Rolling walker (2 wheels) Transfers: Sit to/from Stand Sit to Stand: Min assist   Step pivot transfers: Min assist       General transfer comment: verbal and tactile cues to facilitate increased trunk flexion and to place hands on bed/chair rather than walker. Very slow shuffling steps in an effort to turn from bed to recliner    Ambulation/Gait Ambulation/Gait assistance:  (pt defers attempts at ambulation)                 Chief Strategy Officer    Modified Rankin (Stroke Patients Only)       Balance Overall balance assessment: Needs assistance Sitting-balance support: No upper extremity supported, Feet supported Sitting balance-Leahy Scale: Fair     Standing balance support: Bilateral upper extremity supported, Reliant on assistive device for balance Standing balance-Leahy Scale: Poor                              Cognition Arousal/Alertness: Awake/alert Behavior During Therapy: Flat affect Overall Cognitive Status: Difficult to assess                                 General Comments: very flat, slowed processing and poor attention        Exercises      General Comments General comments (skin integrity, edema, etc.): VSS on  RA      Pertinent Vitals/Pain Pain Assessment Pain Assessment: No/denies pain    Home Living                          Prior Function            PT Goals (current goals can now be found in the care plan section) Acute Rehab PT Goals Patient Stated Goal: home Progress towards PT goals: Not progressing toward goals - comment (slowed processing, refusal of ambulation)    Frequency    Min 3X/week      PT Plan  Current plan remains appropriate    Co-evaluation              AM-PAC PT "6 Clicks" Mobility   Outcome Measure  Help needed turning from your back to your side while in a flat bed without using bedrails?: A Lot Help needed moving from lying on your back to sitting on the side of a flat bed without using bedrails?: A Lot Help needed moving to and from a bed to a chair (including a wheelchair)?: A Little Help needed standing up from a chair using your arms (e.g., wheelchair or bedside chair)?: A Little Help needed to walk in hospital room?: A Lot Help needed climbing 3-5 steps with a railing? : Total 6 Click Score: 13    End of Session   Activity Tolerance: Other (comment) (poor participation) Patient left: in chair;with call bell/phone within reach;with chair alarm set Nurse Communication: Mobility status PT Visit Diagnosis: Unsteadiness on feet (R26.81);Muscle weakness (generalized) (M62.81);Other symptoms and signs involving the nervous system (R29.898)     Time: 5053-9767 PT Time Calculation (min) (ACUTE ONLY): 25 min  Charges:  $Therapeutic Activity: 23-37 mins                     Zenaida Niece, PT, DPT Acute Rehabilitation Office [place] El[facility] Holtry 03/13/2022, 12:21 PM

## 2022-03-14 ENCOUNTER — Telehealth: Payer: Self-pay | Admitting: Family Medicine

## 2022-03-14 ENCOUNTER — Telehealth: Payer: Self-pay | Admitting: *Deleted

## 2022-03-14 NOTE — Telephone Encounter (Signed)
TOC call made 

## 2022-03-15 ENCOUNTER — Inpatient Hospital Stay (HOSPITAL_COMMUNITY)
Admission: EM | Admit: 2022-03-15 | Discharge: 2022-06-10 | DRG: 025 | Disposition: A | Discharge status: Rehabilitation Facility | Payer: Medicare Other | Attending: Neurosurgery | Admitting: Neurosurgery

## 2022-03-15 ENCOUNTER — Emergency Department (HOSPITAL_COMMUNITY): Payer: Medicare Other

## 2022-03-15 ENCOUNTER — Inpatient Hospital Stay (HOSPITAL_COMMUNITY): Payer: Medicare Other

## 2022-03-15 ENCOUNTER — Other Ambulatory Visit: Payer: Self-pay

## 2022-03-15 ENCOUNTER — Encounter (HOSPITAL_COMMUNITY): Payer: Self-pay

## 2022-03-15 DIAGNOSIS — R5383 Other fatigue: Secondary | ICD-10-CM | POA: Diagnosis not present

## 2022-03-15 DIAGNOSIS — D320 Benign neoplasm of cerebral meninges: Secondary | ICD-10-CM | POA: Diagnosis present

## 2022-03-15 DIAGNOSIS — I609 Nontraumatic subarachnoid hemorrhage, unspecified: Secondary | ICD-10-CM | POA: Diagnosis not present

## 2022-03-15 DIAGNOSIS — Z7189 Other specified counseling: Secondary | ICD-10-CM | POA: Diagnosis not present

## 2022-03-15 DIAGNOSIS — E1151 Type 2 diabetes mellitus with diabetic peripheral angiopathy without gangrene: Secondary | ICD-10-CM | POA: Diagnosis present

## 2022-03-15 DIAGNOSIS — Z923 Personal history of irradiation: Secondary | ICD-10-CM

## 2022-03-15 DIAGNOSIS — I471 Supraventricular tachycardia: Secondary | ICD-10-CM | POA: Diagnosis present

## 2022-03-15 DIAGNOSIS — G928 Other toxic encephalopathy: Secondary | ICD-10-CM | POA: Diagnosis not present

## 2022-03-15 DIAGNOSIS — R569 Unspecified convulsions: Secondary | ICD-10-CM | POA: Diagnosis not present

## 2022-03-15 DIAGNOSIS — R5381 Other malaise: Secondary | ICD-10-CM | POA: Diagnosis present

## 2022-03-15 DIAGNOSIS — Z801 Family history of malignant neoplasm of trachea, bronchus and lung: Secondary | ICD-10-CM

## 2022-03-15 DIAGNOSIS — Z818 Family history of other mental and behavioral disorders: Secondary | ICD-10-CM

## 2022-03-15 DIAGNOSIS — K2100 Gastro-esophageal reflux disease with esophagitis, without bleeding: Secondary | ICD-10-CM | POA: Diagnosis not present

## 2022-03-15 DIAGNOSIS — J9601 Acute respiratory failure with hypoxia: Secondary | ICD-10-CM

## 2022-03-15 DIAGNOSIS — R935 Abnormal findings on diagnostic imaging of other abdominal regions, including retroperitoneum: Secondary | ICD-10-CM | POA: Diagnosis not present

## 2022-03-15 DIAGNOSIS — Z886 Allergy status to analgesic agent status: Secondary | ICD-10-CM

## 2022-03-15 DIAGNOSIS — I6349 Cerebral infarction due to embolism of other cerebral artery: Secondary | ICD-10-CM | POA: Diagnosis not present

## 2022-03-15 DIAGNOSIS — Y95 Nosocomial condition: Secondary | ICD-10-CM | POA: Diagnosis not present

## 2022-03-15 DIAGNOSIS — E876 Hypokalemia: Secondary | ICD-10-CM

## 2022-03-15 DIAGNOSIS — M4807 Spinal stenosis, lumbosacral region: Secondary | ICD-10-CM | POA: Diagnosis not present

## 2022-03-15 DIAGNOSIS — R54 Age-related physical debility: Secondary | ICD-10-CM | POA: Diagnosis not present

## 2022-03-15 DIAGNOSIS — M5126 Other intervertebral disc displacement, lumbar region: Secondary | ICD-10-CM | POA: Diagnosis not present

## 2022-03-15 DIAGNOSIS — K219 Gastro-esophageal reflux disease without esophagitis: Secondary | ICD-10-CM | POA: Diagnosis present

## 2022-03-15 DIAGNOSIS — F419 Anxiety disorder, unspecified: Secondary | ICD-10-CM | POA: Diagnosis present

## 2022-03-15 DIAGNOSIS — A419 Sepsis, unspecified organism: Secondary | ICD-10-CM | POA: Diagnosis not present

## 2022-03-15 DIAGNOSIS — E669 Obesity, unspecified: Secondary | ICD-10-CM | POA: Diagnosis present

## 2022-03-15 DIAGNOSIS — Z515 Encounter for palliative care: Secondary | ICD-10-CM

## 2022-03-15 DIAGNOSIS — Z6841 Body Mass Index (BMI) 40.0 and over, adult: Secondary | ICD-10-CM

## 2022-03-15 DIAGNOSIS — E785 Hyperlipidemia, unspecified: Secondary | ICD-10-CM | POA: Diagnosis not present

## 2022-03-15 DIAGNOSIS — K6389 Other specified diseases of intestine: Secondary | ICD-10-CM | POA: Diagnosis not present

## 2022-03-15 DIAGNOSIS — Z888 Allergy status to other drugs, medicaments and biological substances status: Secondary | ICD-10-CM

## 2022-03-15 DIAGNOSIS — Z20822 Contact with and (suspected) exposure to covid-19: Secondary | ICD-10-CM | POA: Diagnosis not present

## 2022-03-15 DIAGNOSIS — E872 Acidosis, unspecified: Secondary | ICD-10-CM | POA: Diagnosis not present

## 2022-03-15 DIAGNOSIS — J690 Pneumonitis due to inhalation of food and vomit: Secondary | ICD-10-CM | POA: Diagnosis not present

## 2022-03-15 DIAGNOSIS — F0284 Dementia in other diseases classified elsewhere, unspecified severity, with anxiety: Secondary | ICD-10-CM | POA: Diagnosis present

## 2022-03-15 DIAGNOSIS — I5082 Biventricular heart failure: Secondary | ICD-10-CM | POA: Diagnosis present

## 2022-03-15 DIAGNOSIS — I130 Hypertensive heart and chronic kidney disease with heart failure and stage 1 through stage 4 chronic kidney disease, or unspecified chronic kidney disease: Secondary | ICD-10-CM | POA: Diagnosis present

## 2022-03-15 DIAGNOSIS — R059 Cough, unspecified: Secondary | ICD-10-CM | POA: Diagnosis not present

## 2022-03-15 DIAGNOSIS — I700 Atherosclerosis of aorta: Secondary | ICD-10-CM | POA: Diagnosis not present

## 2022-03-15 DIAGNOSIS — G309 Alzheimer's disease, unspecified: Secondary | ICD-10-CM | POA: Diagnosis present

## 2022-03-15 DIAGNOSIS — F0283 Dementia in other diseases classified elsewhere, unspecified severity, with mood disturbance: Secondary | ICD-10-CM | POA: Diagnosis present

## 2022-03-15 DIAGNOSIS — I482 Chronic atrial fibrillation, unspecified: Secondary | ICD-10-CM

## 2022-03-15 DIAGNOSIS — I428 Other cardiomyopathies: Secondary | ICD-10-CM | POA: Diagnosis present

## 2022-03-15 DIAGNOSIS — I4891 Unspecified atrial fibrillation: Secondary | ICD-10-CM | POA: Diagnosis not present

## 2022-03-15 DIAGNOSIS — F05 Delirium due to known physiological condition: Secondary | ICD-10-CM | POA: Diagnosis not present

## 2022-03-15 DIAGNOSIS — D509 Iron deficiency anemia, unspecified: Secondary | ICD-10-CM | POA: Diagnosis present

## 2022-03-15 DIAGNOSIS — Z823 Family history of stroke: Secondary | ICD-10-CM

## 2022-03-15 DIAGNOSIS — Z8042 Family history of malignant neoplasm of prostate: Secondary | ICD-10-CM

## 2022-03-15 DIAGNOSIS — G936 Cerebral edema: Secondary | ICD-10-CM | POA: Diagnosis present

## 2022-03-15 DIAGNOSIS — Z7984 Long term (current) use of oral hypoglycemic drugs: Secondary | ICD-10-CM

## 2022-03-15 DIAGNOSIS — I6389 Other cerebral infarction: Secondary | ICD-10-CM | POA: Diagnosis not present

## 2022-03-15 DIAGNOSIS — Z8249 Family history of ischemic heart disease and other diseases of the circulatory system: Secondary | ICD-10-CM

## 2022-03-15 DIAGNOSIS — M7989 Other specified soft tissue disorders: Secondary | ICD-10-CM | POA: Diagnosis not present

## 2022-03-15 DIAGNOSIS — Z7901 Long term (current) use of anticoagulants: Secondary | ICD-10-CM | POA: Diagnosis not present

## 2022-03-15 DIAGNOSIS — E1122 Type 2 diabetes mellitus with diabetic chronic kidney disease: Secondary | ICD-10-CM | POA: Diagnosis present

## 2022-03-15 DIAGNOSIS — I110 Hypertensive heart disease with heart failure: Secondary | ICD-10-CM | POA: Diagnosis not present

## 2022-03-15 DIAGNOSIS — R400 Somnolence: Secondary | ICD-10-CM | POA: Diagnosis not present

## 2022-03-15 DIAGNOSIS — G40409 Other generalized epilepsy and epileptic syndromes, not intractable, without status epilepticus: Secondary | ICD-10-CM | POA: Diagnosis not present

## 2022-03-15 DIAGNOSIS — S0012XA Contusion of left eyelid and periocular area, initial encounter: Secondary | ICD-10-CM | POA: Diagnosis not present

## 2022-03-15 DIAGNOSIS — F418 Other specified anxiety disorders: Secondary | ICD-10-CM | POA: Diagnosis not present

## 2022-03-15 DIAGNOSIS — R110 Nausea: Secondary | ICD-10-CM | POA: Diagnosis not present

## 2022-03-15 DIAGNOSIS — E7800 Pure hypercholesterolemia, unspecified: Secondary | ICD-10-CM | POA: Diagnosis not present

## 2022-03-15 DIAGNOSIS — G935 Compression of brain: Secondary | ICD-10-CM | POA: Diagnosis not present

## 2022-03-15 DIAGNOSIS — D329 Benign neoplasm of meninges, unspecified: Secondary | ICD-10-CM | POA: Diagnosis present

## 2022-03-15 DIAGNOSIS — Z6835 Body mass index (BMI) 35.0-35.9, adult: Secondary | ICD-10-CM

## 2022-03-15 DIAGNOSIS — R140 Abdominal distension (gaseous): Secondary | ICD-10-CM | POA: Diagnosis not present

## 2022-03-15 DIAGNOSIS — Z853 Personal history of malignant neoplasm of breast: Secondary | ICD-10-CM | POA: Diagnosis not present

## 2022-03-15 DIAGNOSIS — I63521 Cerebral infarction due to unspecified occlusion or stenosis of right anterior cerebral artery: Secondary | ICD-10-CM | POA: Diagnosis not present

## 2022-03-15 DIAGNOSIS — K5900 Constipation, unspecified: Secondary | ICD-10-CM | POA: Diagnosis not present

## 2022-03-15 DIAGNOSIS — I509 Heart failure, unspecified: Secondary | ICD-10-CM | POA: Diagnosis not present

## 2022-03-15 DIAGNOSIS — M549 Dorsalgia, unspecified: Secondary | ICD-10-CM | POA: Diagnosis present

## 2022-03-15 DIAGNOSIS — I5043 Acute on chronic combined systolic (congestive) and diastolic (congestive) heart failure: Secondary | ICD-10-CM | POA: Diagnosis not present

## 2022-03-15 DIAGNOSIS — I10 Essential (primary) hypertension: Secondary | ICD-10-CM | POA: Diagnosis not present

## 2022-03-15 DIAGNOSIS — E11649 Type 2 diabetes mellitus with hypoglycemia without coma: Secondary | ICD-10-CM | POA: Diagnosis not present

## 2022-03-15 DIAGNOSIS — D696 Thrombocytopenia, unspecified: Secondary | ICD-10-CM | POA: Diagnosis not present

## 2022-03-15 DIAGNOSIS — I959 Hypotension, unspecified: Secondary | ICD-10-CM | POA: Diagnosis present

## 2022-03-15 DIAGNOSIS — D6489 Other specified anemias: Secondary | ICD-10-CM | POA: Diagnosis present

## 2022-03-15 DIAGNOSIS — I6200 Nontraumatic subdural hemorrhage, unspecified: Secondary | ICD-10-CM | POA: Diagnosis not present

## 2022-03-15 DIAGNOSIS — T17908A Unspecified foreign body in respiratory tract, part unspecified causing other injury, initial encounter: Secondary | ICD-10-CM

## 2022-03-15 DIAGNOSIS — N1832 Chronic kidney disease, stage 3b: Secondary | ICD-10-CM | POA: Diagnosis present

## 2022-03-15 DIAGNOSIS — H518 Other specified disorders of binocular movement: Secondary | ICD-10-CM | POA: Diagnosis not present

## 2022-03-15 DIAGNOSIS — J189 Pneumonia, unspecified organism: Secondary | ICD-10-CM | POA: Diagnosis not present

## 2022-03-15 DIAGNOSIS — E440 Moderate protein-calorie malnutrition: Secondary | ICD-10-CM

## 2022-03-15 DIAGNOSIS — N179 Acute kidney failure, unspecified: Secondary | ICD-10-CM | POA: Diagnosis not present

## 2022-03-15 DIAGNOSIS — E870 Hyperosmolality and hypernatremia: Secondary | ICD-10-CM

## 2022-03-15 DIAGNOSIS — K567 Ileus, unspecified: Secondary | ICD-10-CM | POA: Diagnosis not present

## 2022-03-15 DIAGNOSIS — Z79899 Other long term (current) drug therapy: Secondary | ICD-10-CM

## 2022-03-15 DIAGNOSIS — G40A01 Absence epileptic syndrome, not intractable, with status epilepticus: Secondary | ICD-10-CM | POA: Diagnosis present

## 2022-03-15 DIAGNOSIS — I6340 Cerebral infarction due to embolism of unspecified cerebral artery: Secondary | ICD-10-CM | POA: Diagnosis not present

## 2022-03-15 DIAGNOSIS — K449 Diaphragmatic hernia without obstruction or gangrene: Secondary | ICD-10-CM | POA: Diagnosis not present

## 2022-03-15 DIAGNOSIS — G40909 Epilepsy, unspecified, not intractable, without status epilepticus: Secondary | ICD-10-CM

## 2022-03-15 DIAGNOSIS — D72822 Plasmacytosis: Secondary | ICD-10-CM | POA: Diagnosis not present

## 2022-03-15 DIAGNOSIS — R0600 Dyspnea, unspecified: Secondary | ICD-10-CM | POA: Diagnosis not present

## 2022-03-15 DIAGNOSIS — E871 Hypo-osmolality and hyponatremia: Secondary | ICD-10-CM | POA: Diagnosis not present

## 2022-03-15 DIAGNOSIS — T380X5A Adverse effect of glucocorticoids and synthetic analogues, initial encounter: Secondary | ICD-10-CM | POA: Diagnosis present

## 2022-03-15 DIAGNOSIS — D631 Anemia in chronic kidney disease: Secondary | ICD-10-CM | POA: Diagnosis present

## 2022-03-15 DIAGNOSIS — Z9221 Personal history of antineoplastic chemotherapy: Secondary | ICD-10-CM

## 2022-03-15 DIAGNOSIS — I6202 Nontraumatic subacute subdural hemorrhage: Secondary | ICD-10-CM | POA: Diagnosis not present

## 2022-03-15 DIAGNOSIS — I519 Heart disease, unspecified: Secondary | ICD-10-CM

## 2022-03-15 DIAGNOSIS — D62 Acute posthemorrhagic anemia: Secondary | ICD-10-CM

## 2022-03-15 DIAGNOSIS — M5127 Other intervertebral disc displacement, lumbosacral region: Secondary | ICD-10-CM | POA: Diagnosis not present

## 2022-03-15 DIAGNOSIS — I119 Hypertensive heart disease without heart failure: Secondary | ICD-10-CM | POA: Diagnosis not present

## 2022-03-15 DIAGNOSIS — F32A Depression, unspecified: Secondary | ICD-10-CM | POA: Diagnosis not present

## 2022-03-15 DIAGNOSIS — Z9889 Other specified postprocedural states: Secondary | ICD-10-CM | POA: Diagnosis not present

## 2022-03-15 DIAGNOSIS — Q433 Congenital malformations of intestinal fixation: Secondary | ICD-10-CM | POA: Diagnosis not present

## 2022-03-15 DIAGNOSIS — I480 Paroxysmal atrial fibrillation: Secondary | ICD-10-CM | POA: Diagnosis present

## 2022-03-15 DIAGNOSIS — S0083XA Contusion of other part of head, initial encounter: Secondary | ICD-10-CM | POA: Diagnosis not present

## 2022-03-15 DIAGNOSIS — J449 Chronic obstructive pulmonary disease, unspecified: Secondary | ICD-10-CM | POA: Diagnosis present

## 2022-03-15 DIAGNOSIS — Z90710 Acquired absence of both cervix and uterus: Secondary | ICD-10-CM | POA: Diagnosis not present

## 2022-03-15 DIAGNOSIS — K319 Disease of stomach and duodenum, unspecified: Secondary | ICD-10-CM | POA: Diagnosis present

## 2022-03-15 DIAGNOSIS — Z800 Family history of malignant neoplasm of digestive organs: Secondary | ICD-10-CM

## 2022-03-15 DIAGNOSIS — D72829 Elevated white blood cell count, unspecified: Secondary | ICD-10-CM | POA: Diagnosis present

## 2022-03-15 DIAGNOSIS — J8410 Pulmonary fibrosis, unspecified: Secondary | ICD-10-CM | POA: Diagnosis not present

## 2022-03-15 DIAGNOSIS — R112 Nausea with vomiting, unspecified: Secondary | ICD-10-CM | POA: Diagnosis not present

## 2022-03-15 DIAGNOSIS — E559 Vitamin D deficiency, unspecified: Secondary | ICD-10-CM | POA: Diagnosis present

## 2022-03-15 DIAGNOSIS — L89322 Pressure ulcer of left buttock, stage 2: Secondary | ICD-10-CM | POA: Diagnosis not present

## 2022-03-15 DIAGNOSIS — R252 Cramp and spasm: Secondary | ICD-10-CM | POA: Diagnosis not present

## 2022-03-15 DIAGNOSIS — R0902 Hypoxemia: Secondary | ICD-10-CM | POA: Diagnosis not present

## 2022-03-15 DIAGNOSIS — R4182 Altered mental status, unspecified: Secondary | ICD-10-CM | POA: Diagnosis not present

## 2022-03-15 DIAGNOSIS — R58 Hemorrhage, not elsewhere classified: Secondary | ICD-10-CM | POA: Diagnosis not present

## 2022-03-15 DIAGNOSIS — I517 Cardiomegaly: Secondary | ICD-10-CM | POA: Diagnosis not present

## 2022-03-15 DIAGNOSIS — R509 Fever, unspecified: Secondary | ICD-10-CM | POA: Diagnosis not present

## 2022-03-15 DIAGNOSIS — G9349 Other encephalopathy: Secondary | ICD-10-CM | POA: Diagnosis not present

## 2022-03-15 DIAGNOSIS — I898 Other specified noninfective disorders of lymphatic vessels and lymph nodes: Secondary | ICD-10-CM | POA: Diagnosis not present

## 2022-03-15 DIAGNOSIS — J811 Chronic pulmonary edema: Secondary | ICD-10-CM | POA: Diagnosis not present

## 2022-03-15 DIAGNOSIS — Z9049 Acquired absence of other specified parts of digestive tract: Secondary | ICD-10-CM | POA: Diagnosis not present

## 2022-03-15 DIAGNOSIS — J90 Pleural effusion, not elsewhere classified: Secondary | ICD-10-CM | POA: Diagnosis not present

## 2022-03-15 DIAGNOSIS — Z9011 Acquired absence of right breast and nipple: Secondary | ICD-10-CM

## 2022-03-15 DIAGNOSIS — R0602 Shortness of breath: Secondary | ICD-10-CM | POA: Diagnosis not present

## 2022-03-15 DIAGNOSIS — L8990 Pressure ulcer of unspecified site, unspecified stage: Secondary | ICD-10-CM | POA: Insufficient documentation

## 2022-03-15 DIAGNOSIS — I639 Cerebral infarction, unspecified: Secondary | ICD-10-CM | POA: Diagnosis not present

## 2022-03-15 DIAGNOSIS — M48061 Spinal stenosis, lumbar region without neurogenic claudication: Secondary | ICD-10-CM | POA: Diagnosis not present

## 2022-03-15 DIAGNOSIS — T426X5A Adverse effect of other antiepileptic and sedative-hypnotic drugs, initial encounter: Secondary | ICD-10-CM | POA: Diagnosis not present

## 2022-03-15 DIAGNOSIS — T17908D Unspecified foreign body in respiratory tract, part unspecified causing other injury, subsequent encounter: Secondary | ICD-10-CM | POA: Diagnosis not present

## 2022-03-15 DIAGNOSIS — R262 Difficulty in walking, not elsewhere classified: Secondary | ICD-10-CM | POA: Diagnosis not present

## 2022-03-15 DIAGNOSIS — R1310 Dysphagia, unspecified: Secondary | ICD-10-CM

## 2022-03-15 DIAGNOSIS — N281 Cyst of kidney, acquired: Secondary | ICD-10-CM | POA: Diagnosis not present

## 2022-03-15 DIAGNOSIS — Z4682 Encounter for fitting and adjustment of non-vascular catheter: Secondary | ICD-10-CM | POA: Diagnosis not present

## 2022-03-15 DIAGNOSIS — I255 Ischemic cardiomyopathy: Secondary | ICD-10-CM | POA: Diagnosis not present

## 2022-03-15 DIAGNOSIS — K648 Other hemorrhoids: Secondary | ICD-10-CM | POA: Diagnosis present

## 2022-03-15 DIAGNOSIS — R531 Weakness: Secondary | ICD-10-CM | POA: Diagnosis not present

## 2022-03-15 DIAGNOSIS — Z483 Aftercare following surgery for neoplasm: Secondary | ICD-10-CM | POA: Diagnosis not present

## 2022-03-15 DIAGNOSIS — R109 Unspecified abdominal pain: Secondary | ICD-10-CM | POA: Diagnosis not present

## 2022-03-15 DIAGNOSIS — E1165 Type 2 diabetes mellitus with hyperglycemia: Secondary | ICD-10-CM | POA: Diagnosis present

## 2022-03-15 DIAGNOSIS — K2980 Duodenitis without bleeding: Secondary | ICD-10-CM | POA: Diagnosis present

## 2022-03-15 DIAGNOSIS — D126 Benign neoplasm of colon, unspecified: Secondary | ICD-10-CM | POA: Diagnosis present

## 2022-03-15 DIAGNOSIS — Z751 Person awaiting admission to adequate facility elsewhere: Secondary | ICD-10-CM

## 2022-03-15 DIAGNOSIS — Z9181 History of falling: Secondary | ICD-10-CM

## 2022-03-15 DIAGNOSIS — G2401 Drug induced subacute dyskinesia: Secondary | ICD-10-CM | POA: Diagnosis not present

## 2022-03-15 LAB — CBC WITH DIFFERENTIAL/PLATELET
Abs Immature Granulocytes: 0.16 10*3/uL — ABNORMAL HIGH (ref 0.00–0.07)
Basophils Absolute: 0 10*3/uL (ref 0.0–0.1)
Basophils Relative: 0 %
Eosinophils Absolute: 0 10*3/uL (ref 0.0–0.5)
Eosinophils Relative: 0 %
HCT: 26 % — ABNORMAL LOW (ref 36.0–46.0)
Hemoglobin: 8.3 g/dL — ABNORMAL LOW (ref 12.0–15.0)
Immature Granulocytes: 1 %
Lymphocytes Relative: 4 %
Lymphs Abs: 0.7 10*3/uL (ref 0.7–4.0)
MCH: 28.5 pg (ref 26.0–34.0)
MCHC: 31.9 g/dL (ref 30.0–36.0)
MCV: 89.3 fL (ref 80.0–100.0)
Monocytes Absolute: 0.8 10*3/uL (ref 0.1–1.0)
Monocytes Relative: 5 %
Neutro Abs: 16.5 10*3/uL — ABNORMAL HIGH (ref 1.7–7.7)
Neutrophils Relative %: 90 %
Platelets: 155 10*3/uL (ref 150–400)
RBC: 2.91 MIL/uL — ABNORMAL LOW (ref 3.87–5.11)
RDW: 16.5 % — ABNORMAL HIGH (ref 11.5–15.5)
WBC: 18.2 10*3/uL — ABNORMAL HIGH (ref 4.0–10.5)
nRBC: 0 % (ref 0.0–0.2)

## 2022-03-15 LAB — URINALYSIS, ROUTINE W REFLEX MICROSCOPIC
Bacteria, UA: NONE SEEN
Bilirubin Urine: NEGATIVE
Glucose, UA: >=500 mg/dL — AB
Ketones, ur: NEGATIVE mg/dL
Leukocytes,Ua: NEGATIVE
Nitrite: NEGATIVE
Protein, ur: 30 mg/dL — AB
Specific Gravity, Urine: 1.014 (ref 1.005–1.030)
pH: 6 (ref 5.0–8.0)

## 2022-03-15 LAB — COMPREHENSIVE METABOLIC PANEL
ALT: 32 U/L (ref 0–44)
AST: 22 U/L (ref 15–41)
Albumin: 2.8 g/dL — ABNORMAL LOW (ref 3.5–5.0)
Alkaline Phosphatase: 62 U/L (ref 38–126)
Anion gap: 8 (ref 5–15)
BUN: 27 mg/dL — ABNORMAL HIGH (ref 8–23)
CO2: 24 mmol/L (ref 22–32)
Calcium: 8.2 mg/dL — ABNORMAL LOW (ref 8.9–10.3)
Chloride: 102 mmol/L (ref 98–111)
Creatinine, Ser: 1.63 mg/dL — ABNORMAL HIGH (ref 0.44–1.00)
GFR, Estimated: 32 mL/min — ABNORMAL LOW (ref >60)
Glucose, Bld: 105 mg/dL — ABNORMAL HIGH (ref 70–99)
Potassium: 3.5 mmol/L (ref 3.5–5.1)
Sodium: 134 mmol/L — ABNORMAL LOW (ref 135–145)
Total Bilirubin: 0.5 mg/dL (ref 0.3–1.2)
Total Protein: 5.7 g/dL — ABNORMAL LOW (ref 6.5–8.1)

## 2022-03-15 LAB — MAGNESIUM: Magnesium: 1.7 mg/dL (ref 1.7–2.4)

## 2022-03-15 LAB — CK: Total CK: 118 U/L (ref 38–234)

## 2022-03-15 LAB — GLUCOSE, CAPILLARY
Glucose-Capillary: 109 mg/dL — ABNORMAL HIGH (ref 70–99)
Glucose-Capillary: 127 mg/dL — ABNORMAL HIGH (ref 70–99)
Glucose-Capillary: 160 mg/dL — ABNORMAL HIGH (ref 70–99)

## 2022-03-15 LAB — CBG MONITORING, ED: Glucose-Capillary: 93 mg/dL (ref 70–99)

## 2022-03-15 MED ORDER — CLONAZEPAM 0.5 MG PO TABS
0.5000 mg | ORAL_TABLET | Freq: Every day | ORAL | Status: DC
  Administered 2022-03-15 – 2022-03-25 (×11): 0.5 mg via ORAL
  Filled 2022-03-15 (×11): qty 1

## 2022-03-15 MED ORDER — ROSUVASTATIN CALCIUM 20 MG PO TABS
20.0000 mg | ORAL_TABLET | Freq: Every day | ORAL | Status: DC
  Administered 2022-03-15 – 2022-03-26 (×11): 20 mg via ORAL
  Filled 2022-03-15 (×11): qty 1

## 2022-03-15 MED ORDER — AMIODARONE HCL 200 MG PO TABS
200.0000 mg | ORAL_TABLET | Freq: Every day | ORAL | Status: DC
  Administered 2022-03-15 – 2022-03-26 (×11): 200 mg via ORAL
  Filled 2022-03-15 (×11): qty 1

## 2022-03-15 MED ORDER — INSULIN ASPART 100 UNIT/ML IJ SOLN
0.0000 [IU] | Freq: Three times a day (TID) | INTRAMUSCULAR | Status: DC
  Administered 2022-03-16: 1 [IU] via SUBCUTANEOUS
  Administered 2022-03-16: 2 [IU] via SUBCUTANEOUS
  Administered 2022-03-16: 3 [IU] via SUBCUTANEOUS
  Administered 2022-03-17: 1 [IU] via SUBCUTANEOUS
  Administered 2022-03-17: 2 [IU] via SUBCUTANEOUS
  Administered 2022-03-17: 1 [IU] via SUBCUTANEOUS
  Administered 2022-03-18 – 2022-03-20 (×4): 2 [IU] via SUBCUTANEOUS
  Administered 2022-03-20: 1 [IU] via SUBCUTANEOUS
  Administered 2022-03-20: 2 [IU] via SUBCUTANEOUS
  Administered 2022-03-21: 1 [IU] via SUBCUTANEOUS
  Administered 2022-03-21 (×2): 2 [IU] via SUBCUTANEOUS
  Administered 2022-03-22: 1 [IU] via SUBCUTANEOUS
  Administered 2022-03-22 – 2022-03-23 (×3): 2 [IU] via SUBCUTANEOUS
  Administered 2022-03-23: 3 [IU] via SUBCUTANEOUS
  Administered 2022-03-23 – 2022-03-25 (×4): 2 [IU] via SUBCUTANEOUS
  Administered 2022-03-25: 3 [IU] via SUBCUTANEOUS
  Administered 2022-03-25 – 2022-03-26 (×2): 2 [IU] via SUBCUTANEOUS
  Administered 2022-03-26: 3 [IU] via SUBCUTANEOUS
  Administered 2022-03-26: 2 [IU] via SUBCUTANEOUS
  Administered 2022-03-27: 1 [IU] via SUBCUTANEOUS

## 2022-03-15 MED ORDER — LORAZEPAM 2 MG/ML IJ SOLN
2.0000 mg | Freq: Once | INTRAMUSCULAR | Status: AC
  Administered 2022-03-15: 2 mg via INTRAVENOUS
  Filled 2022-03-15: qty 1

## 2022-03-15 MED ORDER — PANTOPRAZOLE SODIUM 40 MG PO TBEC
40.0000 mg | DELAYED_RELEASE_TABLET | Freq: Two times a day (BID) | ORAL | Status: DC
Start: 2022-03-15 — End: 2022-03-22
  Administered 2022-03-15 – 2022-03-22 (×15): 40 mg via ORAL
  Filled 2022-03-15 (×16): qty 1

## 2022-03-15 MED ORDER — APIXABAN 2.5 MG PO TABS
2.5000 mg | ORAL_TABLET | Freq: Two times a day (BID) | ORAL | Status: DC
  Administered 2022-03-15: 2.5 mg via ORAL
  Filled 2022-03-15: qty 1

## 2022-03-15 MED ORDER — METOPROLOL TARTRATE 5 MG/5ML IV SOLN: 5.0000 mg | INTRAVENOUS | Status: DC | PRN

## 2022-03-15 MED ORDER — DEXAMETHASONE SODIUM PHOSPHATE 4 MG/ML IJ SOLN
4.0000 mg | Freq: Four times a day (QID) | INTRAMUSCULAR | Status: DC
  Administered 2022-03-15 – 2022-03-24 (×35): 4 mg via INTRAVENOUS
  Filled 2022-03-15 (×35): qty 1

## 2022-03-15 MED ORDER — GADOBUTROL 1 MMOL/ML IV SOLN
8.5000 mL | Freq: Once | INTRAVENOUS | Status: AC | PRN
Start: 2022-03-15 — End: 2022-03-15
  Administered 2022-03-15: 8.5 mL via INTRAVENOUS

## 2022-03-15 MED ORDER — SODIUM CHLORIDE 0.9 % IV SOLN
200.0000 mg | Freq: Once | INTRAVENOUS | Status: AC
  Administered 2022-03-15: 200 mg via INTRAVENOUS
  Filled 2022-03-15: qty 20

## 2022-03-15 MED ORDER — DEXAMETHASONE SODIUM PHOSPHATE 10 MG/ML IJ SOLN
10.0000 mg | Freq: Once | INTRAMUSCULAR | Status: AC
Start: 2022-03-15 — End: 2022-03-15
  Administered 2022-03-15: 10 mg via INTRAVENOUS
  Filled 2022-03-15: qty 1

## 2022-03-15 MED ORDER — ONDANSETRON HCL 4 MG/2ML IJ SOLN
4.0000 mg | Freq: Four times a day (QID) | INTRAMUSCULAR | Status: DC | PRN
  Administered 2022-03-25 – 2022-03-26 (×2): 4 mg via INTRAVENOUS
  Filled 2022-03-15 (×2): qty 2

## 2022-03-15 MED ORDER — ONDANSETRON HCL 4 MG PO TABS: 4.0000 mg | ORAL_TABLET | Freq: Four times a day (QID) | ORAL | Status: DC | PRN

## 2022-03-15 MED ORDER — INSULIN ASPART 100 UNIT/ML IJ SOLN
0.0000 [IU] | Freq: Every day | INTRAMUSCULAR | Status: DC
  Administered 2022-03-20: 2 [IU] via SUBCUTANEOUS

## 2022-03-15 MED ORDER — HEPARIN (PORCINE) 25000 UT/250ML-% IV SOLN
550.0000 [IU]/h | INTRAVENOUS | Status: DC
  Administered 2022-03-15: 900 [IU]/h via INTRAVENOUS
  Administered 2022-03-17: 650 [IU]/h via INTRAVENOUS
  Administered 2022-03-19 – 2022-03-21 (×2): 550 [IU]/h via INTRAVENOUS
  Administered 2022-03-22: 600 [IU]/h via INTRAVENOUS
  Filled 2022-03-15 (×5): qty 250

## 2022-03-15 MED ORDER — LEVETIRACETAM IN NACL 500 MG/100ML IV SOLN: 500.0000 mg | Freq: Two times a day (BID) | INTRAVENOUS | Status: DC

## 2022-03-15 MED ORDER — SODIUM CHLORIDE 0.9 % IV SOLN: 2000.0000 mg | Freq: Once | INTRAVENOUS | Status: DC

## 2022-03-15 MED ORDER — SENNOSIDES-DOCUSATE SODIUM 8.6-50 MG PO TABS
1.0000 | ORAL_TABLET | Freq: Every evening | ORAL | Status: DC | PRN
  Administered 2022-03-17: 1 via ORAL
  Filled 2022-03-15: qty 1

## 2022-03-15 MED ORDER — ACETAMINOPHEN 650 MG RE SUPP: 650.0000 mg | Freq: Four times a day (QID) | RECTAL | Status: DC | PRN

## 2022-03-15 MED ORDER — SODIUM CHLORIDE 0.9 % IV SOLN
100.0000 mg | Freq: Two times a day (BID) | INTRAVENOUS | Status: DC
  Administered 2022-03-15: 100 mg via INTRAVENOUS
  Filled 2022-03-15 (×3): qty 10

## 2022-03-15 MED ORDER — LEVETIRACETAM 500 MG/5ML IV SOLN
750.0000 mg | Freq: Two times a day (BID) | INTRAVENOUS | Status: DC
  Administered 2022-03-15: 750 mg via INTRAVENOUS
  Filled 2022-03-15 (×3): qty 7.5

## 2022-03-15 MED ORDER — MONTELUKAST SODIUM 10 MG PO TABS
10.0000 mg | ORAL_TABLET | Freq: Every day | ORAL | Status: DC
  Administered 2022-03-15 – 2022-03-25 (×11): 10 mg via ORAL
  Filled 2022-03-15 (×13): qty 1

## 2022-03-15 MED ORDER — MORPHINE SULFATE (PF) 2 MG/ML IV SOLN: 2.0000 mg | INTRAVENOUS | Status: DC | PRN

## 2022-03-15 MED ORDER — SODIUM CHLORIDE 0.9 % IV SOLN
2000.0000 mg | Freq: Once | INTRAVENOUS | Status: AC
  Administered 2022-03-15: 2000 mg via INTRAVENOUS
  Filled 2022-03-15 (×2): qty 20

## 2022-03-15 MED ORDER — HYDRALAZINE HCL 20 MG/ML IJ SOLN: 10.0000 mg | INTRAMUSCULAR | Status: DC | PRN

## 2022-03-15 MED ORDER — GUAIFENESIN 100 MG/5ML PO LIQD: 5.0000 mL | ORAL | Status: DC | PRN

## 2022-03-15 MED ORDER — LEVETIRACETAM IN NACL 1000 MG/100ML IV SOLN
1000.0000 mg | INTRAVENOUS | Status: AC
  Administered 2022-03-15 (×2): 1000 mg via INTRAVENOUS
  Filled 2022-03-15: qty 100

## 2022-03-15 MED ORDER — SERTRALINE HCL 50 MG PO TABS
25.0000 mg | ORAL_TABLET | Freq: Every day | ORAL | Status: DC
Start: 2022-03-15 — End: 2022-03-28
  Administered 2022-03-15 – 2022-03-26 (×11): 25 mg via ORAL
  Filled 2022-03-15 (×11): qty 1

## 2022-03-15 MED ORDER — OXYCODONE HCL 5 MG PO TABS
5.0000 mg | ORAL_TABLET | ORAL | Status: DC | PRN
  Administered 2022-03-16 – 2022-03-23 (×3): 5 mg via ORAL
  Filled 2022-03-15 (×3): qty 1

## 2022-03-15 MED ORDER — ACETAMINOPHEN 325 MG PO TABS
650.0000 mg | ORAL_TABLET | Freq: Four times a day (QID) | ORAL | Status: DC | PRN
  Administered 2022-03-17 – 2022-03-23 (×3): 650 mg via ORAL
  Filled 2022-03-15 (×5): qty 2

## 2022-03-15 MED ORDER — LEVETIRACETAM IN NACL 1500 MG/100ML IV SOLN
1500.0000 mg | Freq: Two times a day (BID) | INTRAVENOUS | Status: DC
  Filled 2022-03-15: qty 100

## 2022-03-15 NOTE — Assessment & Plan Note (Addendum)
Previously on Zoloft and Klonopin which are discontinued.

## 2022-03-15 NOTE — Assessment & Plan Note (Addendum)
Continue Crestor 

## 2022-03-15 NOTE — Consult Note (Addendum)
NEUROLOGY CONSULTATION NOTE   Date of service: March 15, 2022 Patient Name: Tara [name] MRN:  [id number] DOB:  [birth year] Reason for consult: seizures Requesting physician: Dr. Jeoffrey Massed _ _ _   _ __   _ __ _ _  __ __   _ __   __ _  History of Present Illness   76 yo woman with hx CKD stage 3, HFpEF, a fib on eliquis followed as outpatient by Dr. Jake Samples for meningioma presenting after new onset seizures now in complex partial status.  She was at home and per report husband heard a noise, went into the room and checked on her, and saw generalized tonic-clonic movements of all 4 extremities.  During that point she was essentially unresponsive.  She was transferred from [facility] over to [facility] for long-term EEG monitoring.  She was not yet hooked up when I went to see her however she was poorly responsive and having oral automatisms concerning for complex partial status.  She received a total of 4.5 grams of Keppra this morning and I gave her an additional 2 mg of IV Ativan.  Head CT without contrast showed stable planum sphenoid known meningioma measuring 2.4 x 3.3 x 2.8 cm with surrounding edema (personal review).  She was recently weaned off Decadron but received 10 mg on admission and is now receiving 4 mg every 6 hours.  MRI brain with and without contrast is pending.  She is on Eliquis for A-fib last dose this morning. She was seen by Dr. Maurice Small [facility] this AM who felt like she would likely need resection; he will discuss with her outpatient neurosurgeon Dr. Jake Samples. She has been transitioned from eliquis to heparin gtt in anticipation of probably craniotomy next week.   ROS   UTA 2/2 encephalopathy  Past History   I have reviewed the following:  Past Medical History:  Diagnosis Date  . Abnormal mammogram of right breast 07/29/2017  . 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 Anxiety   . Breast cancer [facility]) 2008   right - s/p lumpectomy->chemo, radiation  . Depression   .  Dysrhythmia    hx SVT  . Family history of colon cancer   . Family history of prostate cancer   . GERD (gastroesophageal reflux disease)   . H/O: hysterectomy   . Hiatal hernia   . History of cancer chemotherapy   . History of radiation therapy   . Hyperlipidemia   . Hypertension 01/25/2018  . Nonischemic cardiomyopathy (HCC)   . Personal history of radiation therapy 01/05/209  . SVT (supraventricular tachycardia) (HCC)    short RP SVT documented 5/14  . 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HCC)   . 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 ABDOMINAL HYSTERECTOMY  1994   fibroids,   . BIOPSY  06/19/2016   Procedure: BIOPSY;  Surgeon: Corbin Ade, MD;  Location: [facility] ENDO SUITE;  Service: Endoscopy;;  gastric duodenum  . BREAST BIOPSY Right 12/16/2006   malignant  . BREAST EXCISIONAL BIOPSY Right 2018   benign lumpectomy  . BREAST LUMPECTOMY Right 02/2007  . BREAST LUMPECTOMY WITH RADIOACTIVE SEED LOCALIZATION Right 07/29/2017   Procedure: RIGHT BREAST LUMPECTOMY WITH RADIOACTIVE SEED LOCALIZATION;  Surgeon: Claud Kelp, MD;  Location[facility] OR;  Service: General;  Laterality: Right;  .  BREAST SURGERY Right 2008   lumpectomy, cancer  . CARDIAC CATHETERIZATION    . CHOLECYSTECTOMY  1999  . COLONOSCOPY  2008   Dr. Darrick Penna: multiple polyps. Path not available at time of visit.   Marland Kitchen COLONOSCOPY WITH PROPOFOL N/A 04/25/2014   Dr. Darrick Penna: Multiple tubular adenomas removed. Diverticulosis. Moderate internal hemorrhoids. Next colonoscopy planned for August 2018.  Marland Kitchen ESOPHAGOGASTRODUODENOSCOPY (EGD) WITH PROPOFOL N/A 06/19/2016   Procedure: ESOPHAGOGASTRODUODENOSCOPY (EGD) WITH PROPOFOL;  Surgeon: Corbin Ade, MD;  Location: [facility];  Service: Endoscopy;  Laterality: N/A;  . MASTECTOMY W/ SENTINEL NODE BIOPSY Right 06/09/2018   Procedure: RIGHT TOTAL  MASTECTOMY WITH SENTINEL LYMPH NODE BIOPSY;  Surgeon: Claud Kelp, MD;  Location: [facility] OR;  Service: General;  Laterality: Right;  . POLYPECTOMY N/A 04/25/2014   Procedure: POLYPECTOMY;  Surgeon: West Bali, MD;  Location: [facility] ORS;  Service: Endoscopy;  Laterality: N/A;  Ascending and Decending Colon x3 , Transverse colon x2, rectal  . PORTACATH PLACEMENT N/A 06/09/2018   Procedure: INSERTION PORT-A-CATH;  Surgeon: Claud Kelp, MD;  Location: [facility] OR;  Service: General;  Laterality: N/A;  . RIGHT/LEFT HEART CATH AND CORONARY ANGIOGRAPHY N/A 07/10/2017   Procedure: RIGHT/LEFT HEART CATH AND CORONARY ANGIOGRAPHY;  Surgeon: Laurey Morale, MD;  Location: [facility] INVASIVE CV LAB;  Service: Cardiovascular;  Laterality: N/A;   Family History  Problem Relation Age of Onset  . Colon cancer Father 24       died at age 22  . Hypertension Sister   . Heart disease Sister   . Cancer Sister        unknown form  . Dementia Mother   . Stroke Mother 54       left hemiparesis  . Cancer Brother 90       prostate  . Cancer Paternal Aunt        NOS  . Lung cancer Paternal Uncle   . Other Paternal Uncle        lightning strike  . Other Paternal Uncle        hit by train  . Other Paternal Aunt        old age  . Cancer Cousin        NOS pat first cousin  . Cancer Cousin        NOS pat first cousin  . Prostate cancer Cousin        pat first cousin   Social History   Socioeconomic History  . Marital status: Married    Spouse name: Not on file  . Number of children: 2  . Years of education: Not on file  . Highest education level: Not on file  Occupational History  . Occupation: disabled     Associate Professor: UNEMPLOYED  Tobacco Use  . Smoking status: Never  . Smokeless tobacco: Never  Vaping Use  . Vaping Use: Never used  Substance and Sexual Activity  . Alcohol use: No  . Drug use: No  . Sexual activity: Not on file  Other Topics Concern  . Not on file  Social History Narrative   Lives in  [place] with family.  Does not routinely exercise.   Social Determinants of Health   Financial Resource Strain: Low Risk  (05/01/2021)   Overall Financial Resource Strain (CARDIA)   . Difficulty of Paying Living Expenses: Not hard at all  Food Insecurity: No Food Insecurity (05/01/2021)   Hunger Vital Sign   . Worried About Programme researcher, broadcasting/film/video in  the Last Year: Never true   . Ran Out of Food in the Last Year: Never true  Transportation Needs: No Transportation Needs (05/01/2021)   PRAPARE - Transportation   . Lack of Transportation (Medical): No   . Lack of Transportation (Non-Medical): No  Physical Activity: Insufficiently Active (05/01/2021)   Exercise Vital Sign   . Days of Exercise per Week: 3 days   . Minutes of Exercise per Session: 30 min  Stress: No Stress Concern Present (05/01/2021)   [employer] of Occupational Health - Occupational Stress Questionnaire   . Feeling of Stress : Not at all  Social Connections: Moderately Integrated (05/01/2021)   Social Connection and Isolation Panel [NHANES]   . Frequency of Communication with Friends and Family: More than three times a week   . Frequency of Social Gatherings with Friends and Family: More than three times a week   . Attends Religious Services: More than 4 times per year   . Active Member of Clubs or Organizations: No   . Attends Banker Meetings: Never   . Marital Status: Married   Allergies  Allergen Reactions  . Aspirin Other (See Comments)    Reports GI bleed--pt is currently taking  . Lisinopril Cough  . Sudafed [Pseudoephedrine Hcl]     Pt reports she had drainage in throat that made her throat hurt.     Medications   Medications Prior to Admission  Medication Sig Dispense Refill Last Dose  . acetaminophen (TYLENOL) 500 MG tablet Take 500-1,000 mg by mouth every 6 (six) hours as needed (FOR PAIN.).   unk  . amiodarone (PACERONE) 200 MG tablet Take 1 tablet (200 mg total) by mouth daily. 30  tablet 0 03/14/2022  . apixaban (ELIQUIS) 2.5 MG TABS tablet Take 1 tablet (2.5 mg total) by mouth 2 (two) times daily. 60 tablet 0 03/14/2022 at 2000  . azelastine (ASTELIN) 0.1 % nasal spray Place 1 spray into both nostrils daily as needed for rhinitis. Use in each nostril as directed   unk  . Cholecalciferol (VITAMIN D) 50 MCG (2000 UT) CAPS Take 2,000 Units by mouth daily.   Past Week  . clonazePAM (KLONOPIN) 0.5 MG tablet Take 1 tablet (0.5 mg total) by mouth at bedtime. 30 tablet 5 03/14/2022  . dexamethasone (DECADRON) 0.5 MG tablet Take 2 tabs for 3 days, then 1 tab for 3 days then stop. 10 tablet 0 03/14/2022  . docusate sodium (COLACE) 100 MG capsule Take 200 mg by mouth 2 (two) times daily as needed for mild constipation.   Past Week  . JARDIANCE 10 MG TABS tablet TAKE ONE TABLET BY MOUTH ONCE DAILY. (Patient taking differently: Take 10 mg by mouth daily.) 90 tablet 3 03/14/2022  . montelukast (SINGULAIR) 10 MG tablet TAKE ONE TABLET BY MOUTH AT BEDTIME. (Patient taking differently: Take 10 mg by mouth at bedtime.) 90 tablet 1 03/14/2022  . pantoprazole (PROTONIX) 40 MG tablet TAKE (1) TABLET BY MOUTH TWICE DAILY. (Patient taking differently: 40 mg 2 (two) times daily.) 180 tablet 0 03/14/2022  . Phenylephrine-Witch Hazel (PREPARATION H) 0.25-50 % GEL Apply 1 Application topically daily as needed (hemorrhoid).   unk  . rosuvastatin (CRESTOR) 20 MG tablet Take 1 tablet (20 mg total) by mouth daily. 30 tablet 5 03/14/2022  . sertraline (ZOLOFT) 25 MG tablet Take 1 tablet (25 mg total) by mouth daily. 30 tablet 5 03/14/2022      Current Facility-Administered Medications:  .  acetaminophen (TYLENOL) tablet 650  mg, 650 mg, Oral, Q6H PRN **OR** acetaminophen (TYLENOL) suppository 650 mg, 650 mg, Rectal, Q6H PRN, Zierle-Ghosh, Asia B, DO .  amiodarone (PACERONE) tablet 200 mg, 200 mg, Oral, Daily, Zierle-Ghosh, Asia B, DO, 200 mg at 03/15/22 1042 .  clonazePAM (KLONOPIN) tablet 0.5 mg, 0.5 mg, Oral,  QHS, Zierle-Ghosh, Asia B, DO .  dexamethasone (DECADRON) injection 4 mg, 4 mg, Intravenous, Q6H, Zierle-Ghosh, Asia B, DO .  guaiFENesin (ROBITUSSIN) 100 MG/5ML liquid 5 mL, 5 mL, Oral, Q4H PRN, Amin, Ankit Chirag, MD .  heparin ADULT infusion 100 units/mL (25000 units/222mL), 900 Units/hr, Intravenous, Continuous, Weiskopf, Marin C, RPH .  hydrALAZINE (APRESOLINE) injection 10 mg, 10 mg, Intravenous, Q4H PRN, Amin, Ankit Chirag, MD .  insulin aspart (novoLOG) injection 0-5 Units, 0-5 Units, Subcutaneous, QHS, Zierle-Ghosh, Asia B, DO .  insulin aspart (novoLOG) injection 0-9 Units, 0-9 Units, Subcutaneous, TID WC, Zierle-Ghosh, Asia B, DO .  levETIRAcetam (KEPPRA) 2,000 mg in sodium chloride 0.9 % 250 mL IVPB, 2,000 mg, Intravenous, Once, Jefferson Fuel, MD .  levETIRAcetam (KEPPRA) IVPB 1500 mg/ 100 mL premix, 1,500 mg, Intravenous, Q12H, Jefferson Fuel, MD .  metoprolol tartrate (LOPRESSOR) injection 5 mg, 5 mg, Intravenous, Q4H PRN, Amin, Ankit Chirag, MD .  montelukast (SINGULAIR) tablet 10 mg, 10 mg, Oral, QHS, Zierle-Ghosh, Asia B, DO .  morphine (PF) 2 MG/ML injection 2 mg, 2 mg, Intravenous, Q2H PRN, Zierle-Ghosh, Asia B, DO .  ondansetron (ZOFRAN) tablet 4 mg, 4 mg, Oral, Q6H PRN **OR** ondansetron (ZOFRAN) injection 4 mg, 4 mg, Intravenous, Q6H PRN, Zierle-Ghosh, Asia B, DO .  oxyCODONE (Oxy IR/ROXICODONE) immediate release tablet 5 mg, 5 mg, Oral, Q4H PRN, Zierle-Ghosh, Asia B, DO .  pantoprazole (PROTONIX) EC tablet 40 mg, 40 mg, Oral, BID, Zierle-Ghosh, Asia B, DO, 40 mg at 03/15/22 1042 .  rosuvastatin (CRESTOR) tablet 20 mg, 20 mg, Oral, Daily, Zierle-Ghosh, Asia B, DO, 20 mg at 03/15/22 1042 .  senna-docusate (Senokot-S) tablet 1 tablet, 1 tablet, Oral, QHS PRN, Amin, Ankit Chirag, MD .  sertraline (ZOLOFT) tablet 25 mg, 25 mg, Oral, Daily, Zierle-Ghosh, Asia B, DO, 25 mg at 03/15/22 1042  Vitals   Vitals:   03/15/22 0543 03/15/22 0545 03/15/22 0546 03/15/22 0815  BP:   117/77 117/77 116/62  Pulse: 72 67 67 69  Resp: 17 (!) 22 18 (!) 22  Temp:   98.4 F (36.9 C) 98.2 F (36.8 C)  TempSrc:   Oral Oral  SpO2: 95% 94% 94% 97%  Weight:      Height:         Body mass index is 40.55 kg/m.  Physical Exam   Physical Exam Gen: somnolent, unable to answer orientation questions or follow commands Resp: normal WOB CV: RRR  Neuro: *MS: somnolent, unable to answer orientation questions or follow commands *Speech: nonverbal *CN: PERRL, blinks to threat bilat, (+) oculocephalics but does not track examiner, face symmetric at rest *Motor:   Normal bulk.  Increased tone throughout. (+) oral automatisms. Unable to perform formal strength exam but moves all extremities spontaneously with at least 3/5 strength *Sensory: Withdraws to noxious stimuli in all extremities bilaterally *Reflexes:  2+ and symmetric throughout without clonus; toes down-going bilat *Coordination, gait: UTA   Labs   CBC:  Recent Labs  Lab 03/10/22 0340 03/15/22 0200  WBC 18.2* 18.2*  NEUTROABS 15.6* 16.5*  HGB 9.1* 8.3*  HCT 26.7* 26.0*  MCV 84.5 89.3  PLT 196 155    Basic Metabolic Panel:  Lab Results  Component Value Date   NA 134 (L) 03/15/2022   K 3.5 03/15/2022   CO2 24 03/15/2022   GLUCOSE 105 (H) 03/15/2022   BUN 27 (H) 03/15/2022   CREATININE 1.63 (H) 03/15/2022   CALCIUM 8.2 (L) 03/15/2022   GFRNONAA 32 (L) 03/15/2022   GFRAA 43 (L) 08/07/2020   Lipid Panel:  Lab Results  Component Value Date   LDLCALC 106 (H) 01/02/2022   HgbA1c:  Lab Results  Component Value Date   HGBA1C 6.3 (H) 02/27/2022   Urine Drug Screen: No results found for: "LABOPIA", "COCAINSCRNUR", "LABBENZ", "AMPHETMU", "THCU", "LABBARB"  Alcohol Level     Component Value Date/Time   ETH <10 02/27/2022 1438     Impression   76 yo woman with hx CKD stage 3, HFpEF, a fib on eliquis followed as outpatient by Dr. Jake Samples for meningioma presenting after new onset seizures now in  complex partial status.    Recommendations   - Continue home clonazepam 0.5mg  qhs - S/p 4.5g keppra load, continue 750mg  q 12 hrs renal dosing - 200mg  IV vimpat load f/b 100mg  q 12 hrs - 2mg  IV ativan now - STAT spot EEG to be f/b LTM EEG with MRI compatible leads - MRI brain wwo pending - Continue decadron 4mg  q 6 hrs - Neurosurgery to recommend surgical timing - Neuro will continue to follow ______________________________________________________________________   Thank you for the opportunity to take part in the care of this patient. If you have any further questions, please contact the neurology consultation attending.  Signed,  Bing Neighbors, MD [facility] [phone]  If 7pm- 7am, please page neurology on call as listed in AMION.

## 2022-03-15 NOTE — Assessment & Plan Note (Addendum)
Continue Protonix °

## 2022-03-15 NOTE — Assessment & Plan Note (Addendum)
Recurrent and persistent. Know specific infectious source identified although patient has been recently treated for recurrent aspiration pneumonia. Recently completed course of unasyn 8/3 CXR 8/19 concerning for edema vs pneumia repeat CXR without acute abnormality, low lung volumes

## 2022-03-15 NOTE — Assessment & Plan Note (Addendum)
Per neurosurgery  

## 2022-03-15 NOTE — Assessment & Plan Note (Addendum)
-   Continue lasix, currently on midodrine

## 2022-03-15 NOTE — Assessment & Plan Note (Addendum)
Patient is on rate/rhythm control and on anticoagulation. -Continue amiodarone and Eliquis

## 2022-03-15 NOTE — Assessment & Plan Note (Addendum)
Seizures Continue Keppra, Vimpat EEG 8/24 with moderate diffuse encephalopathy, no seizures or epileptiform discharges PM&R suggested to consider decreasing keppra, Dr. Florene Glen discussed with neurology on-call, Keppra dose decreased to 750 mg twice daily on 8/24 -Needs close follow-up with neurology -She is medically stable for discharge.   Meningioma, cerebral (HCC) -Underwent bilateral craniotomy, meningioma resection on 7/3, Dr. Christella Noa -Postop course complicated by seizures, SDH and strokes Her cognition and neurological function have been stable.   Encephalopathy, lethargy PM&R recommended considering ritalin 5 mg twice daily  -Started on low-dose Ritalin 8/26, disc continued this on account of nausea and vomiting -Mental status slowly improving, continues to have frequent lipsmacking,(Tardive dyskinesia)  Her mentation has been stable, continue working with physical therapy.   Leukocytosis no clinical signs of infection, wbc has been trending down. Continue to hold on antibiotic therapy.   Acute respiratory failure with hypoxia (HCC) -Improved, completed ABX for aspiration pneumonia Continue with aspiration precautions.   Acute on chronic combined systolic and diastolic CHF Most recent Transthoracic Echocardiogram with LVEF of 40-45% (down from 55%) with global hypokinesis and associated RV dysfunction noted.   Patient on diuretic therapy with good toleration, Continue furosemide and K supplementation  Blood pressure and volume status are sable.  Hypertension - Continue furosemide for diuresis  on midodrine for blood pressure support.   Anxiety and depression Will resume sertraline and will continue to hold on clonazepam for now.    GERD -Continue Protonix  Atrial fibrillation, chronic (HCC) Patient is on rate/rhythm control and on anticoagulation. -Continue amiodarone for rate control and anticoagulation with apixaban.   AKI (acute kidney injury)  ([place]) -Resolved  Hypokalemia/ hypernatremia resolved.    Hyperlipidemia -Continue Crestor  Dysphagia Previously with a Cortrak and tube feedings. Evaluated/followed by speech therapy. -Speech therapy recommendations (8/28)     - regular solids, whole meds with puree (see note)  Acute postoperative anemia due to expected blood loss Acute anemia down to 6.5 requiring 2 units of PRBC. Hemoglobin currently stable.  Thrombocytopenia (HCC)-resolved as of 05/07/2022 Resolved.

## 2022-03-16 DIAGNOSIS — D320 Benign neoplasm of cerebral meninges: Secondary | ICD-10-CM | POA: Diagnosis not present

## 2022-03-16 DIAGNOSIS — F419 Anxiety disorder, unspecified: Secondary | ICD-10-CM | POA: Diagnosis not present

## 2022-03-16 DIAGNOSIS — F32A Depression, unspecified: Secondary | ICD-10-CM | POA: Diagnosis not present

## 2022-03-16 DIAGNOSIS — R569 Unspecified convulsions: Secondary | ICD-10-CM | POA: Diagnosis not present

## 2022-03-16 DIAGNOSIS — K219 Gastro-esophageal reflux disease without esophagitis: Secondary | ICD-10-CM | POA: Diagnosis not present

## 2022-03-16 LAB — CBC
HCT: 23.4 % — ABNORMAL LOW (ref 36.0–46.0)
Hemoglobin: 7.7 g/dL — ABNORMAL LOW (ref 12.0–15.0)
MCH: 28.5 pg (ref 26.0–34.0)
MCHC: 32.9 g/dL (ref 30.0–36.0)
MCV: 86.7 fL (ref 80.0–100.0)
Platelets: 143 10*3/uL — ABNORMAL LOW (ref 150–400)
RBC: 2.7 MIL/uL — ABNORMAL LOW (ref 3.87–5.11)
RDW: 15.9 % — ABNORMAL HIGH (ref 11.5–15.5)
WBC: 13.5 10*3/uL — ABNORMAL HIGH (ref 4.0–10.5)
nRBC: 0 % (ref 0.0–0.2)

## 2022-03-16 LAB — MAGNESIUM: Magnesium: 1.9 mg/dL (ref 1.7–2.4)

## 2022-03-16 LAB — BASIC METABOLIC PANEL
Anion gap: 7 (ref 5–15)
BUN: 24 mg/dL — ABNORMAL HIGH (ref 8–23)
CO2: 21 mmol/L — ABNORMAL LOW (ref 22–32)
Calcium: 8.4 mg/dL — ABNORMAL LOW (ref 8.9–10.3)
Chloride: 104 mmol/L (ref 98–111)
Creatinine, Ser: 1.11 mg/dL — ABNORMAL HIGH (ref 0.44–1.00)
GFR, Estimated: 52 mL/min — ABNORMAL LOW (ref >60)
Glucose, Bld: 135 mg/dL — ABNORMAL HIGH (ref 70–99)
Potassium: 3.7 mmol/L (ref 3.5–5.1)
Sodium: 132 mmol/L — ABNORMAL LOW (ref 135–145)

## 2022-03-16 LAB — GLUCOSE, CAPILLARY
Glucose-Capillary: 135 mg/dL — ABNORMAL HIGH (ref 70–99)
Glucose-Capillary: 206 mg/dL — ABNORMAL HIGH (ref 70–99)
Glucose-Capillary: 165 mg/dL — ABNORMAL HIGH (ref 70–99)
Glucose-Capillary: 156 mg/dL — ABNORMAL HIGH (ref 70–99)

## 2022-03-16 LAB — APTT
aPTT: 100 seconds — ABNORMAL HIGH (ref 24–36)
aPTT: 116 seconds — ABNORMAL HIGH (ref 24–36)

## 2022-03-16 LAB — HEPARIN LEVEL (UNFRACTIONATED): Heparin Unfractionated: >1.1 IU/mL — ABNORMAL HIGH (ref 0.30–0.70)

## 2022-03-16 MED ORDER — LEVETIRACETAM 750 MG PO TABS
750.0000 mg | ORAL_TABLET | Freq: Two times a day (BID) | ORAL | Status: DC
  Administered 2022-03-16 – 2022-03-18 (×6): 750 mg via ORAL
  Filled 2022-03-16 (×6): qty 1

## 2022-03-16 MED ORDER — LACOSAMIDE 50 MG PO TABS
100.0000 mg | ORAL_TABLET | Freq: Two times a day (BID) | ORAL | Status: DC
  Administered 2022-03-16 – 2022-03-23 (×15): 100 mg via ORAL
  Filled 2022-03-16 (×15): qty 2

## 2022-03-16 NOTE — Evaluation (Signed)
Occupational Therapy Evaluation Patient Details Name: Tara [name] MRN: [id number] DOB: [birth year] Today's Date: 03/16/2022   History of Present Illness 76 yo woman admitted with new onset seizures related to meningioma. Per NSU, likely resection/craniotomy next week. Recent DC from [facility] 6/22.PMH: CKD stage 3, HFpEF, a fib,  meningioma.   Clinical Impression   Since recent DC, pt's husband has been walking her throughout the day with HHA and assisting with all ADL tasks. Husband reports recent incontinence of B/B. Husband assisted during session today as Tara participates better with husband guiding mobility. Requires mod to Max A with bed mobility and min A + 2 for safety during mobility with HHA . Plan for possible surgery/crani next week. Will reassess after surgery. Goal is to return home with husband with HHOT, however pt may need rehab at [facility]. Acute OT to follow.      Recommendations for follow up therapy are one component of a multi-disciplinary discharge planning process, led by the attending physician.  Recommendations may be updated based on patient status, additional functional criteria and insurance authorization.   Follow Up Recommendations  Home health OT (pending progress; may need rehab after surgery)    Assistance Recommended at Discharge Frequent or constant Supervision/Assistance  Patient can return home with the following A little help with walking and/or transfers;Assistance with cooking/housework;Direct supervision/assist for medications management;Direct supervision/assist for financial management;Assist for transportation;Help with stairs or ramp for entrance;A lot of help with bathing/dressing/bathroom    Functional Status Assessment  Patient has had a recent decline in their functional status and demonstrates the ability to make significant improvements in function in a reasonable and predictable amount of time.  Equipment Recommendations  None recommended by OT     Recommendations for Other Services  (pending progress; may need rehab after surgery)     Precautions / Restrictions Precautions Precautions: Fall Precaution Comments: fearful of falling Restrictions Weight Bearing Restrictions: No      Mobility Bed Mobility Overal bed mobility: Needs Assistance Bed Mobility: Supine to Sit, Sit to Supine     Supine to sit: HOB elevated, Max assist Sit to supine: Mod assist   General bed mobility comments: use of hand hold, PT/OT assists in pivoting hips and initiating movement of LEs; husband holds bothof her hands and guides her to the edge of the bed;husband would benefit in education regarding guiding pt in a different method    Transfers Overall transfer level: Needs assistance Equipment used: Rolling walker (2 wheels) Transfers: Sit to/from Stand Sit to Stand: Min assist, +2 safety/equipment     Step pivot transfers: Min assist            Balance Overall balance assessment: Needs assistance Sitting-balance support: No upper extremity supported, Feet supported Sitting balance-Leahy Scale: Fair Sitting balance - Comments: static sitting   Standing balance support: Bilateral upper extremity supported, Reliant on assistive device for balance Standing balance-Leahy Scale: Poor Standing balance comment: reliant on external support                           ADL either performed or assessed with clinical judgement   ADL Overall ADL's : Needs assistance/impaired Eating/Feeding: Set up;Sitting;Supervision/ safety   Grooming: Set up;Sitting;Supervision/safety   Upper Body Bathing: Sitting;Minimal assistance   Lower Body Bathing: Sitting/lateral leans;Sit to/from stand;Maximal assistance   Upper Body Dressing : Sitting;Moderate assistance   Lower Body Dressing: Sit to/from stand;Maximal assistance   Toilet Transfer: Minimal assistance (hand  held assist as pt refusing to use RW)   Toileting- Architect  and Hygiene: Sitting/lateral lean;Sit to/from stand;Total assistance Toileting - Clothing Manipulation Details (indicate cue type and reason): incontinenet     Functional mobility during ADLs: +2 for safety/equipment;Minimal assistance       Vision Baseline Vision/History: 1 Wears glasses Ability to See in Adequate Light: 0 Adequate Patient Visual Report: No change from baseline Additional Comments: will further assess     Perception     Praxis      Pertinent Vitals/Pain Pain Assessment Pain Assessment: Faces Faces Pain Scale: No hurt     Hand Dominance Right   Extremity/Trunk Assessment Upper Extremity Assessment Upper Extremity Assessment: Generalized weakness   Lower Extremity Assessment Lower Extremity Assessment: Defer to PT evaluation   Cervical / Trunk Assessment Cervical / Trunk Assessment: Normal (increased bodyhabitus)   Communication Communication Communication: No difficulties (not very verbal)   Cognition Arousal/Alertness: Awake/alert Behavior During Therapy: Flat affect Overall Cognitive Status: Difficult to assess                                 General Comments: very flat, slowed processing and poor attention; following 1 step commands; does better with husband; poor initiation of movement; eyes closed majorityof session     General Comments  VSS on RA; encourage chair position if not in chair; enjoys gospel music, watching TV Tara [name])    Exercises     Shoulder Instructions      Home Living Family/patient expects to be discharged to:: Private residence Living Arrangements: Spouse/significant other Available Help at Discharge: Family;Available PRN/intermittently Type of Home: House Home Access: Ramped entrance   Entrance Stairs-Rails: Right;Left Home Layout: Two level;Laundry or work area in basement Alternate Teacher, music of Steps: flight Alternate Level Stairs-Rails: Right;Left Bathroom Shower/Tub:  (walk in  tub)   Bathroom Toilet: Standard Bathroom Accessibility: Yes   Home Equipment: Rollator (4 wheels);Grab bars - tub/shower;Rolling Walker (2 wheels);BSC/3in1;Wheelchair - manual;Hospital bed          Prior Functioning/Environment Prior Level of Function : Independent/Modified Independent;Needs assist       Physical Assist : Mobility (physical);ADLs (physical) Mobility (physical): Transfers;Gait (husband has been walking her "2 room lengths" at home with HHA, not using RW) ADLs (physical): Grooming;Bathing;Dressing;Toileting   ADLs Comments: husband assisting wtih all ADL tasks with the exception of feeding; recent incontinence        OT Problem List: Decreased strength;Decreased activity tolerance;Impaired balance (sitting and/or standing);Obesity;Decreased cognition;Decreased safety awareness;Decreased knowledge of use of DME or AE      OT Treatment/Interventions: Self-care/ADL training;Therapeutic exercise;Energy conservation;DME and/or AE instruction;Therapeutic activities;Patient/family education;Balance training;Cognitive remediation/compensation    OT Goals(Current goals can be found in the care plan section) Acute Rehab OT Goals Patient Stated Goal: per husband for pt to keepmoving so hecan take her home OT Goal Formulation: With patient/family Time For Goal Achievement: 03/29/22 Potential to Achieve Goals: Good  OT Frequency: Min 2X/week    Co-evaluation PT/OT/SLP Co-Evaluation/Treatment: Yes            AM-PAC OT "6 Clicks" Daily Activity     Outcome Measure Help from another person eating meals?: A Little Help from another person taking care of personal grooming?: A Little Help from another person toileting, which includes using toliet, bedpan, or urinal?: Total Help from another person bathing (including washing, rinsing, drying)?: A Lot Help from another person to put on and taking  off regular upper body clothing?: A Lot   6 Click Score: 11   End of  Session Equipment Utilized During Treatment: Gait belt Nurse Communication: Mobility status  Activity Tolerance: Patient tolerated treatment well Patient left: with call bell/phone within reach;with family/visitor present;in bed;with SCD's reapplied (chair position)  OT Visit Diagnosis: Unsteadiness on feet (R26.81);Other abnormalities of gait and mobility (R26.89);Muscle weakness (generalized) (M62.81);Other symptoms and signs involving the nervous system (R29.898);Other symptoms and signs involving cognitive function                Time: 1610-9604 OT Time Calculation (min): 23 min Charges:  OT General Charges $OT Visit: 1 Visit OT Evaluation $OT Eval Moderate Complexity: 1 Mod  Nelta Caudill, OT/L   Acute OT Clinical Specialist Acute Rehabilitation Services Pager [phone] Office [phone]   [facility] 03/16/2022, 9:11 AM

## 2022-03-17 DIAGNOSIS — F419 Anxiety disorder, unspecified: Secondary | ICD-10-CM | POA: Diagnosis not present

## 2022-03-17 DIAGNOSIS — K219 Gastro-esophageal reflux disease without esophagitis: Secondary | ICD-10-CM | POA: Diagnosis not present

## 2022-03-17 DIAGNOSIS — D320 Benign neoplasm of cerebral meninges: Secondary | ICD-10-CM | POA: Diagnosis not present

## 2022-03-17 DIAGNOSIS — F32A Depression, unspecified: Secondary | ICD-10-CM | POA: Diagnosis not present

## 2022-03-17 LAB — CBC
HCT: 23.6 % — ABNORMAL LOW (ref 36.0–46.0)
Hemoglobin: 7.9 g/dL — ABNORMAL LOW (ref 12.0–15.0)
MCH: 29 pg (ref 26.0–34.0)
MCHC: 33.5 g/dL (ref 30.0–36.0)
MCV: 86.8 fL (ref 80.0–100.0)
Platelets: 159 10*3/uL (ref 150–400)
RBC: 2.72 MIL/uL — ABNORMAL LOW (ref 3.87–5.11)
RDW: 15.9 % — ABNORMAL HIGH (ref 11.5–15.5)
WBC: 18.6 10*3/uL — ABNORMAL HIGH (ref 4.0–10.5)
nRBC: 0 % (ref 0.0–0.2)

## 2022-03-17 LAB — GLUCOSE, CAPILLARY
Glucose-Capillary: 141 mg/dL — ABNORMAL HIGH (ref 70–99)
Glucose-Capillary: 158 mg/dL — ABNORMAL HIGH (ref 70–99)
Glucose-Capillary: 140 mg/dL — ABNORMAL HIGH (ref 70–99)
Glucose-Capillary: 138 mg/dL — ABNORMAL HIGH (ref 70–99)

## 2022-03-17 LAB — HEPARIN LEVEL (UNFRACTIONATED): Heparin Unfractionated: >1.1 IU/mL — ABNORMAL HIGH (ref 0.30–0.70)

## 2022-03-17 LAB — APTT
aPTT: 112 seconds — ABNORMAL HIGH (ref 24–36)
aPTT: 71 seconds — ABNORMAL HIGH (ref 24–36)

## 2022-03-18 ENCOUNTER — Other Ambulatory Visit: Payer: Self-pay | Admitting: Neurosurgery

## 2022-03-18 DIAGNOSIS — D320 Benign neoplasm of cerebral meninges: Secondary | ICD-10-CM | POA: Diagnosis not present

## 2022-03-18 DIAGNOSIS — E440 Moderate protein-calorie malnutrition: Secondary | ICD-10-CM | POA: Diagnosis not present

## 2022-03-18 DIAGNOSIS — R569 Unspecified convulsions: Secondary | ICD-10-CM | POA: Diagnosis not present

## 2022-03-18 DIAGNOSIS — F419 Anxiety disorder, unspecified: Secondary | ICD-10-CM | POA: Diagnosis not present

## 2022-03-18 LAB — BASIC METABOLIC PANEL
Anion gap: 9 (ref 5–15)
BUN: 40 mg/dL — ABNORMAL HIGH (ref 8–23)
CO2: 22 mmol/L (ref 22–32)
Calcium: 8.9 mg/dL (ref 8.9–10.3)
Chloride: 103 mmol/L (ref 98–111)
Creatinine, Ser: 1.43 mg/dL — ABNORMAL HIGH (ref 0.44–1.00)
GFR, Estimated: 38 mL/min — ABNORMAL LOW (ref >60)
Glucose, Bld: 129 mg/dL — ABNORMAL HIGH (ref 70–99)
Potassium: 4 mmol/L (ref 3.5–5.1)
Sodium: 134 mmol/L — ABNORMAL LOW (ref 135–145)

## 2022-03-18 LAB — GLUCOSE, CAPILLARY
Glucose-Capillary: 120 mg/dL — ABNORMAL HIGH (ref 70–99)
Glucose-Capillary: 155 mg/dL — ABNORMAL HIGH (ref 70–99)
Glucose-Capillary: 156 mg/dL — ABNORMAL HIGH (ref 70–99)
Glucose-Capillary: 161 mg/dL — ABNORMAL HIGH (ref 70–99)

## 2022-03-18 LAB — HEPARIN LEVEL (UNFRACTIONATED): Heparin Unfractionated: >1.1 IU/mL — ABNORMAL HIGH (ref 0.30–0.70)

## 2022-03-18 LAB — APTT: aPTT: 87 seconds — ABNORMAL HIGH (ref 24–36)

## 2022-03-18 NOTE — Progress Notes (Signed)
Patient ID: Tara [name], female   DOB: 18-Mar-1946, 76 y.o.   MRN: [id number] Have spoken with Tara [name] and her daughter. Her daughter is concerned with her inability to walk since her initial admission. She was initially told she was not walking because of renal failure. Her renal function has improved significantly, yet she is not walking. 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 Tara [name] the daughter who is a Charity fundraiser, feels that ever since the initial hospitalization that her mother has not had real improvement.  It may be all the new anticonvulsants, or some other etiology. Tumor resection will be schedule for another date, but the mass does not account for her current exam.  Perrl, alert and oriented x4, speech is clear, fluent Motor function in the upper extremities is normal, she is feeding herself dinner with ease.  Will look further into ambulation as that is most pressing issue at this time, given that seizures appear to be controlled.

## 2022-03-18 NOTE — Progress Notes (Signed)
Patient ID: Tara [name], female   DOB: 11-13-1945, 76 y.o.   MRN: [id number] Case canceled for tomorrow. Most people with seizures do not have a mass. Nor do they harbor a mass which is quiescent for the length of time she has had the mass.  Given the progress note I read by the hospitalist it seems she is back at baseline.  I will speak with the family this afternoon. There absolutely is no urgent nor emergent need for this meningioma to be resected.

## 2022-03-19 ENCOUNTER — Inpatient Hospital Stay (HOSPITAL_COMMUNITY): Payer: Medicare Other

## 2022-03-19 ENCOUNTER — Encounter (HOSPITAL_COMMUNITY)
Admission: EM | Disposition: A | Discharge status: Rehabilitation Facility | Payer: Self-pay | Source: Home / Self Care | Attending: Neurosurgery

## 2022-03-19 DIAGNOSIS — M7989 Other specified soft tissue disorders: Secondary | ICD-10-CM | POA: Diagnosis not present

## 2022-03-19 DIAGNOSIS — K219 Gastro-esophageal reflux disease without esophagitis: Secondary | ICD-10-CM | POA: Diagnosis not present

## 2022-03-19 DIAGNOSIS — R569 Unspecified convulsions: Secondary | ICD-10-CM | POA: Diagnosis not present

## 2022-03-19 DIAGNOSIS — F419 Anxiety disorder, unspecified: Secondary | ICD-10-CM | POA: Diagnosis not present

## 2022-03-19 DIAGNOSIS — D320 Benign neoplasm of cerebral meninges: Secondary | ICD-10-CM | POA: Diagnosis not present

## 2022-03-19 LAB — GLUCOSE, CAPILLARY
Glucose-Capillary: 132 mg/dL — ABNORMAL HIGH (ref 70–99)
Glucose-Capillary: 153 mg/dL — ABNORMAL HIGH (ref 70–99)
Glucose-Capillary: 158 mg/dL — ABNORMAL HIGH (ref 70–99)
Glucose-Capillary: 133 mg/dL — ABNORMAL HIGH (ref 70–99)

## 2022-03-19 LAB — APTT
aPTT: 59 seconds — ABNORMAL HIGH (ref 24–36)
aPTT: 76 seconds — ABNORMAL HIGH (ref 24–36)

## 2022-03-19 LAB — HEPARIN LEVEL (UNFRACTIONATED): Heparin Unfractionated: >1.1 IU/mL — ABNORMAL HIGH (ref 0.30–0.70)

## 2022-03-19 SURGERY — CRANIOTOMY TUMOR EXCISION
Anesthesia: General

## 2022-03-19 MED ORDER — LEVETIRACETAM 500 MG PO TABS
500.0000 mg | ORAL_TABLET | Freq: Two times a day (BID) | ORAL | Status: DC
  Administered 2022-03-19 – 2022-03-23 (×10): 500 mg via ORAL
  Filled 2022-03-19 (×10): qty 1

## 2022-03-19 NOTE — Progress Notes (Signed)
Patient ID: Tara [name], female   DOB: Jun 19, 1946, 76 y.o.   MRN: [id number] BP 138/64 (BP Location: Right Leg)   Pulse 74   Temp 98.2 F (36.8 C) (Oral)   Resp 18   Ht '4\' 9"'$  (1.448 m)   Wt 85 kg   SpO2 96%   BMI 40.55 kg/m  Alert and oriented, following all commands Moving all extremities, weakness in hip flexors, quads and hams I will request a call when PT arrives tomorrow so I can witness her gait Report about not swallowing well, but swallowing liquids well Family is concerned about mentation, speech, inability to walk, and abulia Family states Tara [name] is not at baseline and has not returned to baseline since her initial hospitalization.  No seizure activity noted today. MRI lumbar spine is unrevealing. No masses, nor other etiology to explain gait. No myelopathy thus no strong indication for thoracic or cervical imaging.

## 2022-03-19 NOTE — NC FL2 (Signed)
[place] MEDICAID FL2 LEVEL OF CARE SCREENING TOOL     IDENTIFICATION  Patient Name: Tara [name] Birthdate: [birth year] Sex: female Admission Date (Current Location): 03/15/2022  [facility] and [place] Number:  [employer] and Address:  [facility], [place] [address] [place], [place] [address]      Provider Number: [id number]  Attending Physician Name and Address:  Jonetta Osgood, MD  Relative Name and Phone Number:       Current Level of Care: Hospital Recommended Level of Care: [place] Prior Approval Number:    Date Approved/Denied:   PASRR Number: [id number], Expires 04/05/22  Discharge Plan: SNF    Current Diagnoses: Patient Active Problem List   Diagnosis Date Noted   New onset seizure ([place]) 03/15/2022   Atrial fibrillation, chronic ([place]) 03/15/2022   Protein-calorie malnutrition, moderate ([place]) 03/15/2022   Atrial fibrillation with RVR ([place])    Metabolic acidosis 40/34/7425   Acute renal failure superimposed on stage 3b chronic kidney disease ([place]) 02/27/2022   Anxiety and depression 02/27/2022   Meningioma, cerebral ([place]) 02/27/2022   Normocytic anemia 02/27/2022   Acute renal failure (ARF) ([place]) 02/27/2022   Hemorrhoids 01/04/2021   Ankle deformity, right 09/12/2020   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   Port-A-Cath in place 09/28/2018   Colon adenomas 08/06/2018   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 ([place]) 06/09/2018   Hypertension 01/25/2018   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   Demand ischemia (HCC)    Prolonged Q-T interval on ECG 09/29/2016   Chronic kidney disease (CKD), stage III (moderate) ([place]) 09/29/2016   Leukocytosis 09/18/2016   Abnormal CT scan, chest    Upper airway cough syndrome  vs Cough variant asthma  04/09/2016   Seasonal allergies 08/08/2014   Family history of colon cancer 03/30/2014   Osteoporosis 95/63/8756   Metabolic syndrome X  43/32/9518   SVT (supraventricular tachycardia) ([place]) 84/16/6063   Chronic systolic CHF (congestive heart failure) ([place]) 03/14/2013   Vitamin D deficiency 07/23/2010   Morbid obesity ([place]) 07/04/2009   Malignant neoplasm of right breast ([place]) 12/13/2007   Hyperlipidemia 12/13/2007   Anxiety state 12/13/2007   Depression, major, single episode, in partial remission ([place]) 12/13/2007   GERD 12/13/2007    Orientation RESPIRATION BLADDER Height & Weight     Self, Time, Situation, Place  Normal Incontinent Weight: 187 lb 6.3 oz (85 kg) Height:  '4\' 9"'$  (144.8 cm)  BEHAVIORAL SYMPTOMS/MOOD NEUROLOGICAL BOWEL NUTRITION STATUS      Incontinent Diet (see DC summary)  AMBULATORY STATUS COMMUNICATION OF NEEDS Skin   Limited Assist Verbally Normal                       Personal Care Assistance Level of Assistance  Bathing, Feeding, Dressing Bathing Assistance: Limited assistance Feeding assistance: Independent Dressing Assistance: Limited assistance     Functional Limitations Info  Sight Sight Info: Impaired        SPECIAL CARE FACTORS FREQUENCY  PT (By licensed PT), OT (By licensed OT), Speech therapy     PT Frequency: 5x/wk OT Frequency: 5x/wk     Speech Therapy Frequency: 5x/wk      Contractures Contractures Info: Not present    Additional Factors Info  Code Status, Allergies Code Status Info: Full Allergies Info: Aspirin, Lisinopril, Sudafed (Pseudoephedrine Hcl) Psychotropic Info: Zoloft '25mg'$  daily, Klonopin 0.5 at bedtime Insulin Sliding Scale Info: see DC summary  Current Medications (03/19/2022):  This is the current hospital active medication list Current Facility-Administered Medications  Medication Dose Route Frequency Provider Last Rate Last Admin   acetaminophen (TYLENOL) tablet 650 mg  650 mg Oral Q6H PRN Zierle-Ghosh, Asia B, DO   650 mg at 03/17/22 2313   Or   acetaminophen (TYLENOL) suppository 650 mg  650 mg Rectal Q6H PRN Zierle-Ghosh, Asia  B, DO       amiodarone (PACERONE) tablet 200 mg  200 mg Oral Daily Zierle-Ghosh, Asia B, DO   200 mg at 03/19/22 1113   clonazePAM (KLONOPIN) tablet 0.5 mg  0.5 mg Oral QHS Zierle-Ghosh, Asia B, DO   0.5 mg at 03/18/22 2139   dexamethasone (DECADRON) injection 4 mg  4 mg Intravenous Q6H Zierle-Ghosh, Asia B, DO   4 mg at 03/19/22 1250   guaiFENesin (ROBITUSSIN) 100 MG/5ML liquid 5 mL  5 mL Oral Q4H PRN Amin, Ankit Chirag, MD       heparin ADULT infusion 100 units/mL (25000 units/297m)  550 Units/hr Intravenous Continuous Bryk, Veronda P, RPH 5.5 mL/hr at 03/19/22 0600 550 Units/hr at 03/19/22 0600   hydrALAZINE (APRESOLINE) injection 10 mg  10 mg Intravenous Q4H PRN Amin, Ankit Chirag, MD       insulin aspart (novoLOG) injection 0-5 Units  0-5 Units Subcutaneous QHS Zierle-Ghosh, Asia B, DO       insulin aspart (novoLOG) injection 0-9 Units  0-9 Units Subcutaneous TID WC Zierle-Ghosh, Asia B, DO   2 Units at 03/19/22 1249   lacosamide (VIMPAT) tablet 100 mg  100 mg Oral BID SDerek Jack MD   100 mg at 03/19/22 1113   levETIRAcetam (KEPPRA) tablet 500 mg  500 mg Oral BID GJonetta Osgood MD   500 mg at 03/19/22 1114   metoprolol tartrate (LOPRESSOR) injection 5 mg  5 mg Intravenous Q4H PRN Amin, Ankit Chirag, MD       montelukast (SINGULAIR) tablet 10 mg  10 mg Oral QHS Zierle-Ghosh, Asia B, DO   10 mg at 03/18/22 2140   morphine (PF) 2 MG/ML injection 2 mg  2 mg Intravenous Q2H PRN Zierle-Ghosh, Asia B, DO       ondansetron (ZOFRAN) tablet 4 mg  4 mg Oral Q6H PRN Zierle-Ghosh, Asia B, DO       Or   ondansetron (ZOFRAN) injection 4 mg  4 mg Intravenous Q6H PRN Zierle-Ghosh, Asia B, DO       oxyCODONE (Oxy IR/ROXICODONE) immediate release tablet 5 mg  5 mg Oral Q4H PRN Zierle-Ghosh, Asia B, DO   5 mg at 03/18/22 1938   pantoprazole (PROTONIX) EC tablet 40 mg  40 mg Oral BID Zierle-Ghosh, Asia B, DO   40 mg at 03/19/22 1113   rosuvastatin (CRESTOR) tablet 20 mg  20 mg Oral Daily  Zierle-Ghosh, Asia B, DO   20 mg at 03/19/22 1113   senna-docusate (Senokot-S) tablet 1 tablet  1 tablet Oral QHS PRN ADamita Lack MD   1 tablet at 03/17/22 2254   sertraline (ZOLOFT) tablet 25 mg  25 mg Oral Daily Zierle-Ghosh, Asia B, DO   25 mg at 03/19/22 1113     Discharge Medications: Please see discharge summary for a list of discharge medications.  Relevant Imaging Results:  Relevant Lab Results:   Additional Information SS#: [id number] EGeralynn Ochs LCSW

## 2022-03-19 NOTE — Progress Notes (Signed)
Occupational Therapy Treatment Patient Details Name: Tara [name] MRN: [id number] DOB: [birth year] Today's Date: 03/19/2022   History of present illness 76 yo woman admitted 03/15/22 with new onset seizures related to meningioma. Per NSU, likely resection/craniotomy next week. Recent DC from [facility] 6/22.PMH: CKD stage 3, HFpEF, a fib,  meningioma.   OT comments  Pt making slow progress towards goals this session, pt max A for UB bathing, LB dressing, and pericare in standing. Pt needing mod-max A +2 for bed mobility and max A+2 for sit to stand transfer x2 with RW. Pt needing cues to remain upright, spouse present and motivating pt. Pt able to perform seated UE exercise at EOB. Pt with R lateral lean and new LUE edema, positioned BUE on pillows at end of session for supportive positioning. Pt presenting with impairments listed below, will follow acutely. Updating d/c recommendation to SNF at this time to improve functional mobility and ADLs for safety upon returning home and to decrease caregiver burden.   Recommendations for follow up therapy are one component of a multi-disciplinary discharge planning process, led by the attending physician.  Recommendations may be updated based on patient status, additional functional criteria and insurance authorization.    Follow Up Recommendations  Skilled nursing-short term rehab (<3 hours/day)    Assistance Recommended at Discharge Frequent or constant Supervision/Assistance  Patient can return home with the following  Two people to help with walking and/or transfers;A lot of help with bathing/dressing/bathroom;Assistance with cooking/housework;Direct supervision/assist for medications management;Direct supervision/assist for financial management;Assist for transportation;Help with stairs or ramp for entrance   Equipment Recommendations  None recommended by OT;Other (comment) (defer to next venue of care)    Recommendations for Other Services       Precautions / Restrictions Precautions Precautions: Fall Precaution Comments: LUE edema Restrictions Weight Bearing Restrictions: No       Mobility Bed Mobility Overal bed mobility: Needs Assistance Bed Mobility: Supine to Sit     Supine to sit: Max assist Sit to supine: Mod assist, +2 for physical assistance   General bed mobility comments: use of bed pad    Transfers Overall transfer level: Needs assistance Equipment used: Rolling walker (2 wheels), 2 person hand held assist Transfers: Sit to/from Stand Sit to Stand: Max assist, +2 physical assistance           General transfer comment: sit to stand x2 for pericare; cues to remain upright     Balance Overall balance assessment: Needs assistance Sitting-balance support: No upper extremity supported, Feet supported Sitting balance-Leahy Scale: Poor Sitting balance - Comments: R lateral lean, holds onto bedrail with LUE   Standing balance support: Bilateral upper extremity supported, Single extremity supported, During functional activity Standing balance-Leahy Scale: Poor Standing balance comment: reliant on external support                           ADL either performed or assessed with clinical judgement   ADL Overall ADL's : Needs assistance/impaired         Upper Body Bathing: Maximal assistance;Sitting Upper Body Bathing Details (indicate cue type and reason): to wash back         Lower Body Dressing: Maximal assistance;Bed level   Toilet Transfer: Maximal assistance;+2 for physical assistance;Rolling walker (2 wheels)   Toileting- Clothing Manipulation and Hygiene: Total assistance Toileting - Clothing Manipulation Details (indicate cue type and reason): incontinent     Functional mobility during ADLs: +2 for physical  assistance;Maximal assistance      Extremity/Trunk Assessment Upper Extremity Assessment Upper Extremity Assessment: Generalized weakness (grossly weak, LUE edema  noted, keeps bil UE with elbows flexed at 90* and wrists flexed when seated EOB)   Lower Extremity Assessment Lower Extremity Assessment: Defer to PT evaluation        Vision   Additional Comments: will further assess   Perception Perception Perception: Not tested   Praxis Praxis Praxis: Not tested    Cognition Arousal/Alertness: Lethargic Behavior During Therapy: Flat affect Overall Cognitive Status: Impaired/Different from baseline Area of Impairment: Attention, Safety/judgement, Problem solving                   Current Attention Level: Sustained     Safety/Judgement: Decreased awareness of deficits, Decreased awareness of safety   Problem Solving: Slow processing, Decreased initiation, Difficulty sequencing, Requires verbal cues, Requires tactile cues General Comments: delayed responses, flat affect, minimal responses to questions, opens eyes to cues        Exercises Exercises: General Upper Extremity General Exercises - Upper Extremity Shoulder Flexion: Both, 5 reps, Seated    Shoulder Instructions       General Comments BP 108/51 in supine after standing x2 for pericare    Pertinent Vitals/ Pain       Pain Assessment Pain Assessment: No/denies pain Pain Intervention(s): Limited activity within patient's tolerance, Monitored during session  Home Living                                          Prior Functioning/Environment              Frequency  Min 2X/week        Progress Toward Goals  OT Goals(current goals can now be found in the care plan section)  Progress towards OT goals: Progressing toward goals  Acute Rehab OT Goals Patient Stated Goal: none stated OT Goal Formulation: With patient/family Time For Goal Achievement: 03/29/22 Potential to Achieve Goals: Good ADL Goals Pt Will Perform Grooming: with set-up;sitting Pt Will Perform Upper Body Bathing: with supervision;with set-up;sitting Pt Will Perform  Lower Body Bathing: with min assist;sit to/from stand Pt Will Perform Lower Body Dressing: with modified independence;sitting/lateral leans;sit to/from stand Pt Will Transfer to Toilet: bedside commode;ambulating;with min guard assist Pt Will Perform Toileting - Clothing Manipulation and hygiene: with modified independence;sitting/lateral leans;sit to/from stand Additional ADL Goal #1: Pt will demonstrate ability to complete 2 step ADL task in minimally distracting environment wihtout redirectional cues  Plan Frequency remains appropriate;Discharge plan needs to be updated    Co-evaluation                 AM-PAC OT "6 Clicks" Daily Activity     Outcome Measure   Help from another person eating meals?: A Little Help from another person taking care of personal grooming?: A Little Help from another person toileting, which includes using toliet, bedpan, or urinal?: Total Help from another person bathing (including washing, rinsing, drying)?: A Lot Help from another person to put on and taking off regular upper body clothing?: A Lot Help from another person to put on and taking off regular lower body clothing?: Total 6 Click Score: 12    End of Session Equipment Utilized During Treatment: Gait belt;Rolling walker (2 wheels)  OT Visit Diagnosis: Unsteadiness on feet (R26.81);Other abnormalities of gait and mobility (R26.89);Muscle weakness (generalized) (M62.81);Other  symptoms and signs involving the nervous system (R29.898);Other symptoms and signs involving cognitive function   Activity Tolerance Patient limited by fatigue   Patient Left in bed;with call bell/phone within reach;with bed alarm set;with family/visitor present   Nurse Communication Mobility status; pt's BP at end of session, LUE edema        Time: 8127-5170 OT Time Calculation (min): 45 min  Charges: OT General Charges $OT Visit: 1 Visit OT Treatments $Self Care/Home Management : 23-37 mins $Therapeutic  Activity: 8-22 mins  Lynnda Child, OTD, OTR/L Acute Rehab [phone]) [phone] 03/19/2022, 3:53 PM

## 2022-03-19 NOTE — Progress Notes (Signed)
ANTICOAGULATION CONSULT NOTE - Follow Up Consult  Pharmacy Consult for heparin Indication: atrial fibrillation  Labs: Recent Labs    03/17/22 0343 03/17/22 1327 03/18/22 0335 03/19/22 0309 03/19/22 1159  HGB 7.9*  --   --   --   --   HCT 23.6*  --   --   --   --   PLT 159  --   --   --   --   APTT 112*   < > 87* 59* 76*  HEPARINUNFRC >1.10*  --  >1.10* >1.10*  --   CREATININE  --   --  1.43*  --   --    < > = values in this interval not displayed.     Assessment: 76yo female therapeutic on heparin  Goal of Therapy:  aPTT 66-102 seconds   Plan:  Heparin at 550 units / hr Follow up AM labs  Thank you Anette Guarneri, PharmD  03/19/2022,1:12 PM

## 2022-03-19 NOTE — Progress Notes (Signed)
Physical Therapy Treatment Patient Details Name: Tara [name] MRN: [id number] DOB: October 14, 1945 Today's Date: 03/19/2022   History of Present Illness 76 yo woman admitted 03/15/22 with new onset seizures related to meningioma. Per NSU, likely resection/craniotomy next week. Recent DC from [facility] 6/22.PMH: CKD stage 3, HFpEF, a fib,  meningioma.    PT Comments    Pt with decline medically and functionally today. Spouse reports pt being unable to swallow since last night and is unable to eat her breakfast. Pt also holding R UE in flexed withdrawal position with hand grasped closed, pt with R lateral lean, unable to take steps today as she has the last two days, L swollen hand, and continues with minimal eye opening and decreased vocalization. RN present and has observed changes as well.  Message sent to Dr. Sloan Leiter and Dr. Cyndy Freeze. Spouse reports "I think she had a seizure last night." . Acute PT to cont to follow and assess mobility. At this time pt unsafe to return home as pt is requiring maxAX2 for mobility. Recommend ST-SNF upon d/c to improve all functional and cognitive deficits for safe transition home with spouse.     Recommendations for follow up therapy are one component of a multi-disciplinary discharge planning process, led by the attending physician.  Recommendations may be updated based on patient status, additional functional criteria and insurance authorization.  Follow Up Recommendations  Skilled nursing-short term rehab (<3 hours/day) Can patient physically be transported by private vehicle: Yes   Assistance Recommended at Discharge Frequent or constant Supervision/Assistance  Patient can return home with the following A lot of help with walking and/or transfers;A lot of help with bathing/dressing/bathroom;Assistance with cooking/housework;Assistance with feeding;Direct supervision/assist for medications management;Direct supervision/assist for financial management;Assist for  transportation;Help with stairs or ramp for entrance   Equipment Recommendations  Wheelchair (measurements PT);Wheelchair cushion (measurements PT)    Recommendations for Other Services       Precautions / Restrictions Precautions Precautions: Fall Precaution Comments: L hand swelling with ring on ring finder that is too tight Restrictions Weight Bearing Restrictions: No     Mobility  Bed Mobility Overal bed mobility: Needs Assistance Bed Mobility: Supine to Sit Rolling: Max assist   Supine to sit: +2 for physical assistance, HOB elevated, Total assist     General bed mobility comments: pt with no active participation or initiation of task, once up at EOB pt with R lateral lean    Transfers Overall transfer level: Needs assistance Equipment used: Rolling walker (2 wheels), 2 person hand held assist Transfers: Sit to/from Stand Sit to Stand: Min assist, +2 physical assistance Stand pivot transfers: Max assist, +2 safety/equipment         General transfer comment: pt powered up well to walker, once up pt with significant R lateral lean, no eye opening despite max verbal cues. pt given max tactile cues for weight shifting to take steps however pt unable.    Ambulation/Gait               General Gait Details: pt unable this date despite maxAX2 and tactile cues for weight shifting. Pt's spouse stated "Let me help her walk, I'll get her to walk." Spouse unable to assist pt either. Attempted x 4 however unsuccessful   Stairs             Wheelchair Mobility    Modified Rankin (Stroke Patients Only) Modified Rankin (Stroke Patients Only) Pre-Morbid Rankin Score: Moderately severe disability Modified Rankin: Severe disability  Balance Overall balance assessment: Needs assistance Sitting-balance support: No upper extremity supported, Feet supported Sitting balance-Leahy Scale: Poor Sitting balance - Comments: static sitting however with R lateral  lean   Standing balance support: Bilateral upper extremity supported, Single extremity supported, During functional activity Standing balance-Leahy Scale: Poor Standing balance comment: reliant on external support                            Cognition Arousal/Alertness: Lethargic Behavior During Therapy: Flat affect Overall Cognitive Status: Impaired/Different from baseline                                 General Comments: pt has had flat affect and minimal eye opening however pt not answering as man questions, pt with less eye opening, delayed response to following commands/initiation        Exercises      General Comments General comments (skin integrity, edema, etc.): VSS      Pertinent Vitals/Pain Pain Assessment Pain Assessment: Faces Faces Pain Scale: No hurt    Home Living                          Prior Function            PT Goals (current goals can now be found in the care plan section) Progress towards PT goals: Progressing toward goals    Frequency    Min 3X/week      PT Plan Frequency needs to be updated;Discharge plan needs to be updated    Co-evaluation              AM-PAC PT "6 Clicks" Mobility   Outcome Measure  Help needed turning from your back to your side while in a flat bed without using bedrails?: A Lot Help needed moving from lying on your back to sitting on the side of a flat bed without using bedrails?: A Lot Help needed moving to and from a bed to a chair (including a wheelchair)?: A Lot Help needed standing up from a chair using your arms (e.g., wheelchair or bedside chair)?: A Lot Help needed to walk in hospital room?: Total Help needed climbing 3-5 steps with a railing? : Total 6 Click Score: 10    End of Session Equipment Utilized During Treatment: Gait belt Activity Tolerance: Patient limited by lethargy Patient left: in chair;with call bell/phone within reach;with chair alarm  set;with family/visitor present Nurse Communication: Mobility status PT Visit Diagnosis: Unsteadiness on feet (R26.81);Muscle weakness (generalized) (M62.81);Other symptoms and signs involving the nervous system (R29.898);Other abnormalities of gait and mobility (R26.89);Difficulty in walking, not elsewhere classified (R26.2)     Time: 1610-9604 PT Time Calculation (min) (ACUTE ONLY): 39 min  Charges:  $Therapeutic Activity: 38-52 mins                     Kittie Plater, PT, DPT Acute Rehabilitation Services Secure chat preferred Office #: [phone]    Berline Lopes 03/19/2022, 11:16 AM

## 2022-03-19 NOTE — Plan of Care (Signed)
Pt has been weak today and unable to move her extremities. Pt was able to be in the chair for a few hours. Pt is able to stand but not move her legs. Family has been at bedside. Pt has not c/o pain. Extremities are swollen. Diet hs been changed by SLP. Pills have been crushed in pudding that are able to be crushed.  Problem: Medication: Goal: Risk for medication side effects will decrease Outcome: Progressing   Problem: Clinical Measurements: Goal: Complications related to the disease process, condition or treatment will be avoided or minimized Outcome: Progressing Goal: Diagnostic test results will improve Outcome: Progressing   Problem: Safety: Goal: Verbalization of understanding the information provided will improve Outcome: Progressing   Problem: Self-Concept: Goal: Level of anxiety will decrease Outcome: Progressing Goal: Ability to verbalize feelings about condition will improve Outcome: Progressing

## 2022-03-19 NOTE — Progress Notes (Signed)
PROGRESS NOTE        PATIENT DETAILS Name: Tara [name] Age: 76 y.o. Sex: female Date of Birth: 1946-07-16 Admit Date: 03/15/2022 Admitting Physician Rolla Plate, DO FOY:DXAJOIN, Norwood Levo, MD  Brief Summary: Patient is a 76 y.o.  female with a history of HTN, HLD, CKD stage IIIb, chronic HFpEF, meningioma, breast CA-s/p right lumpectomy and chemoradiation in 2008, depression/anxiety who was hospitalized from 6/8-6/22 for AKI/A-fib RVR with hypotension-presented to [facility] with new onset seizures.    Significant events: 6/08-6/22>> hospitalization for weakness-AKI-A-fib RVR with hypotension-meningioma-subsequently discharged home after stabilization.   6/24>> presented to [facility] ED with seizure-transferred to [facility].  Significant studies: 6/08>> CT head: Interval increase in size of previously noted planum sphenoidal meningioma. 6/08>> CT C-spine: No fracture or dislocation 6/09>> Echo: EF 55% 6/12>> TSH: 0.429 6/13>> CXR: No PNA 6/24>> CT head: No acute process-stable sphenoidal meningioma 2.4 x 3.3 x 2.8 cm-hypodense lesion is seen in the frontal lobe medially to the adjacent mass-representing edema/infarct. 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  Per spouse reports difficulty chewing solids-no problem with liquids.  But otherwise she will open her eyes to me-move her upper extremities very fluently-she is somewhat slow and moving her lower extremities-but nonetheless move both of her lower extremities.  Spouse claims that patient has low back pain for the past several days as  well.  Objective: Vitals: Blood pressure 126/66, pulse 77, temperature (!) 97.5 F (36.4 C), temperature source Oral, resp. rate 15, height '4\' 9"'$  (1.448 m), weight 85 kg, SpO2 95 %.   Exam: Gen Exam: Not in any distress HEENT:atraumatic, normocephalic Chest: B/L clear to auscultation anteriorly CVS:S1S2 regular Abdomen:soft non tender, non distended Extremities:no edema Neurology: Generalized weakness-more weak in the lower extremities bilaterally-exam is not very consistent. Skin: no rash   Pertinent Labs/Radiology:    Latest Ref Rng & Units 03/17/2022    3:43 AM 03/16/2022    3:57 AM 03/15/2022    2:00 AM  CBC  WBC 4.0 - 10.5 K/uL 18.6  13.5  18.2   Hemoglobin 12.0 - 15.0 g/dL 7.9  7.7  8.3   Hematocrit 36.0 - 46.0 % 23.6  23.4  26.0   Platelets 150 - 400 K/uL 159  143  155     Lab Results  Component Value Date   NA 134 (L) 03/18/2022   K 4.0 03/18/2022   CL 103 03/18/2022   CO2 22 03/18/2022     Assessment/Plan: New onset seizure: Overall improved-no further seizures-maintain on Keppra/Vimpat-this morning she appears somewhat sleepy-lethargic-difficulty ambulating-discussed with neurologist Dr. Jola Babinski Keppra to 500 mg twice daily-obtain Keppra levels.  We will also go ahead and get a MRI of the lumbosacral spine.  Neurosurgery not convinced that seizure is from longstanding meningioma.  Meningioma: See above regarding possibility for seizures to be provoked from meningioma-on Decadron-neurosurgery following-see above  PAF: Maintaining sinus rhythm-continue amiodarone-Eliquis stopped on 6/24 in anticipation of craniotomy-currently on IV heparin.    Chronic HFpEF: Volume status stable  HTN: BP stable without use of any antihypertensives  HLD: Continue Crestor  CKD stage  IIIb: At baseline-watch closely.  Normocytic anemia: Due to CKD-hemoglobin stable-we will transfuse if significant drop.  Leukocytosis: No indication of infection-likely due to steroid  use.  Depression/anxiety: Continue Zoloft/Klonopin  GERD: Continue PPI  Prediabetes (A1c 6.3 on 6/8)-with steroid-induced hyperglycemia: Monitor CBGs on SSI.  Recent Labs    03/18/22 2131 03/19/22 0610 03/19/22 1128  GLUCAP 161* 132* 153*    Back pain: Per spouse-patient has had back pain for the past several days-due to difficulty ambulation/lower extremity weakness-we will go ahead and get a MRI of her lumbosacral spine.   Chronic debility/deconditioning: Continue PT/OT eval.  Morbid Obesity: Estimated body mass index is 40.55 kg/m as calculated from the following:   Height as of this encounter: '4\' 9"'$  (1.448 m).   Weight as of this encounter: 85 kg.    Code status:   Code Status: Full Code   DVT Prophylaxis: SCDs Start: 03/15/22 0454 IV heparin    Family Communication: Spouse at bedside  Disposition Plan: Status is: Inpatient Remains inpatient appropriate because: New onset seizure-craniotomy later this week-will require several more days of hospitalization.   Planned Discharge Destination:Skilled nursing facility versus home health.  Diet: Diet Order             Diet Carb Modified Fluid consistency: Thin; Room service appropriate? Yes with Assist  Diet effective 1400                     Antimicrobial agents: Anti-infectives (From admission, onward)    None        MEDICATIONS: Scheduled Meds:  amiodarone  200 mg Oral Daily   clonazePAM  0.5 mg Oral QHS   dexamethasone (DECADRON) injection  4 mg Intravenous Q6H   insulin aspart  0-5 Units Subcutaneous QHS   insulin aspart  0-9 Units Subcutaneous TID WC   lacosamide  100 mg Oral BID   levETIRAcetam  500 mg Oral BID   montelukast  10 mg Oral QHS   pantoprazole  40 mg Oral BID   rosuvastatin  20 mg Oral Daily   sertraline  25 mg Oral Daily   Continuous Infusions:  heparin 550 Units/hr (03/19/22 0600)   PRN Meds:.acetaminophen **OR** acetaminophen, guaiFENesin, hydrALAZINE, metoprolol  tartrate, morphine injection, ondansetron **OR** ondansetron (ZOFRAN) IV, oxyCODONE, senna-docusate   I have personally reviewed following labs and imaging studies  LABORATORY DATA: CBC: Recent Labs  Lab 03/15/22 0200 03/16/22 0357 03/17/22 0343  WBC 18.2* 13.5* 18.6*  NEUTROABS 16.5*  --   --   HGB 8.3* 7.7* 7.9*  HCT 26.0* 23.4* 23.6*  MCV 89.3 86.7 86.8  PLT 155 143* 159     Basic Metabolic Panel: Recent Labs  Lab 03/15/22 0200 03/15/22 0530 03/16/22 0357 03/18/22 0335  NA 134*  --  132* 134*  K 3.5  --  3.7 4.0  CL 102  --  104 103  CO2 24  --  21* 22  GLUCOSE 105*  --  135* 129*  BUN 27*  --  24* 40*  CREATININE 1.63*  --  1.11* 1.43*  CALCIUM 8.2*  --  8.4* 8.9  MG  --  1.7 1.9  --      GFR: Estimated Creatinine Clearance: 30.2 mL/min (A) (by C-G formula based on SCr of 1.43 mg/dL (H)).  Liver Function Tests: Recent Labs  Lab 03/15/22 0200  AST 22  ALT 32  ALKPHOS 62  BILITOT 0.5  PROT 5.7*  ALBUMIN 2.8*    No results for  input(s): "LIPASE", "AMYLASE" in the last 168 hours.  No results for input(s): "AMMONIA" in the last 168 hours.  Coagulation Profile: No results for input(s): "INR", "PROTIME" in the last 168 hours.   Cardiac Enzymes: Recent Labs  Lab 03/15/22 0200  CKTOTAL 118     BNP (last 3 results) No results for input(s): "PROBNP" in the last 8760 hours.  Lipid Profile: No results for input(s): "CHOL", "HDL", "LDLCALC", "TRIG", "CHOLHDL", "LDLDIRECT" in the last 72 hours.  Thyroid Function Tests: No results for input(s): "TSH", "T4TOTAL", "FREET4", "T3FREE", "THYROIDAB" in the last 72 hours.   Anemia Panel: No results for input(s): "VITAMINB12", "FOLATE", "FERRITIN", "TIBC", "IRON", "RETICCTPCT" in the last 72 hours.  Urine analysis:    Component Value Date/Time   COLORURINE YELLOW 03/15/2022 0832   APPEARANCEUR CLEAR 03/15/2022 0832   LABSPEC 1.014 03/15/2022 0832   PHURINE 6.0 03/15/2022 0832   GLUCOSEU >=500 (A)  03/15/2022 0832   HGBUR SMALL (A) 03/15/2022 0832   BILIRUBINUR NEGATIVE 03/15/2022 0832   KETONESUR NEGATIVE 03/15/2022 0832   PROTEINUR 30 (A) 03/15/2022 0832   UROBILINOGEN 0.2 02/07/2013 1859   NITRITE NEGATIVE 03/15/2022 0832   LEUKOCYTESUR NEGATIVE 03/15/2022 0832    Sepsis Labs: Lactic Acid, Venous    Component Value Date/Time   LATICACIDVEN 1.7 03/03/2022 1340    MICROBIOLOGY: No results found for this or any previous visit (from the past 240 hour(s)).   RADIOLOGY STUDIES/RESULTS: No results found.   LOS: 4 days   Oren Binet, MD  [facility]    To contact the attending provider between 7A-7P or the covering provider during after hours 7P-7A, please log into the web site [email or web address] and access using universal [place] password for that web site. If you do not have the password, please call the hospital operator.  03/19/2022, 11:31 AM

## 2022-03-19 NOTE — Progress Notes (Signed)
ANTICOAGULATION CONSULT NOTE - Follow Up Consult  Pharmacy Consult for heparin Indication: atrial fibrillation  Labs: Recent Labs    03/17/22 0343 03/17/22 1327 03/18/22 0335 03/19/22 0309  HGB 7.9*  --   --   --   HCT 23.6*  --   --   --   PLT 159  --   --   --   APTT 112* 71* 87* 59*  HEPARINUNFRC >1.10*  --  >1.10* >1.10*  CREATININE  --   --  1.43*  --      Assessment: 76yo female subtherapeutic on heparin after two PTTs at goal; no infusion issues or signs of bleeding per RN.  Goal of Therapy:  aPTT 66-102 seconds   Plan:  Will increase heparin infusion slightly to 550 units/hr and check PTT in 8 hours.    Wynona Neat, PharmD, BCPS  03/19/2022,4:17 AM

## 2022-03-19 NOTE — Evaluation (Signed)
Clinical/Bedside Swallow Evaluation Patient Details  Name: Tara [name] MRN: [id number] Date of Birth: Jul 02, 1946  Today's Date: 03/19/2022 Time: SLP Start Time (ACUTE ONLY): 22 SLP Stop Time (ACUTE ONLY): 1435 SLP Time Calculation (min) (ACUTE ONLY): 23 min  Past Medical History:  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   Anxiety    Breast cancer ([place]) 2008   right - s/p lumpectomy->chemo, radiation   Depression    Dysrhythmia    hx SVT   Family history of colon cancer    Family history of prostate cancer    GERD (gastroesophageal reflux disease)    H/O: hysterectomy    Hiatal hernia    History of cancer chemotherapy    History of radiation therapy    Hyperlipidemia    Hypertension 01/25/2018   Nonischemic cardiomyopathy ([place])    Personal history of radiation therapy 01/05/209   SVT (supraventricular tachycardia) ([place])    short RP SVT documented 3/55   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[place])    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)   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   Past Surgical History:  Past Surgical History:  Procedure Laterality Date   ABDOMINAL HYSTERECTOMY  1994   fibroids,    BIOPSY  06/19/2016   Procedure: BIOPSY;  Surgeon: Daneil Dolin, MD;  [facility];  Service: Endoscopy;;  gastric duodenum   BREAST BIOPSY Right 12/16/2006   malignant   BREAST EXCISIONAL BIOPSY Right 2018   benign lumpectomy   BREAST LUMPECTOMY Right 02/2007   BREAST LUMPECTOMY WITH RADIOACTIVE SEED LOCALIZATION Right 07/29/2017   Procedure: RIGHT BREAST LUMPECTOMY WITH RADIOACTIVE SEED LOCALIZATION;  Surgeon: Fanny Skates, MD;  Location: [place];  Service: General;  Laterality: Right;   BREAST SURGERY Right 2008   lumpectomy, cancer   CARDIAC CATHETERIZATION     CHOLECYSTECTOMY  1999   COLONOSCOPY  2008   Dr. Oneida Alar: multiple polyps.  Path not available at time of visit.    COLONOSCOPY WITH PROPOFOL N/A 04/25/2014   Dr. Oneida Alar: Multiple tubular adenomas removed. Diverticulosis. Moderate internal hemorrhoids. Next colonoscopy planned for August 2018.   ESOPHAGOGASTRODUODENOSCOPY (EGD) WITH PROPOFOL N/A 06/19/2016   Procedure: ESOPHAGOGASTRODUODENOSCOPY (EGD) WITH PROPOFOL;  Surgeon: Daneil Dolin, MD;  [facility];  Service: Endoscopy;  Laterality: N/A;   MASTECTOMY W/ SENTINEL NODE BIOPSY Right 06/09/2018   Procedure: RIGHT TOTAL MASTECTOMY WITH SENTINEL LYMPH NODE BIOPSY;  Surgeon: Fanny Skates, MD;  Location: [place];  Service: General;  Laterality: Right;   POLYPECTOMY N/A 04/25/2014   Procedure: POLYPECTOMY;  Surgeon: Danie Binder, MD;  Location: [facility];  Service: Endoscopy;  Laterality: N/A;  Ascending and Decending Colon x3 , Transverse colon x2, rectal   PORTACATH PLACEMENT N/A 06/09/2018   Procedure: INSERTION PORT-A-CATH;  Surgeon: Fanny Skates, MD;  Location: [place];  Service: General;  Laterality: N/A;   RIGHT/LEFT HEART CATH AND CORONARY ANGIOGRAPHY N/A 07/10/2017   Procedure: RIGHT/LEFT HEART CATH AND CORONARY ANGIOGRAPHY;  Surgeon: Larey Dresser, MD;  Location: [facility];  Service: Cardiovascular;  Laterality: N/A;   HPI:  Pt is a 76 y/o who presented 6/24 with new onset seizures. 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 EEG negative for seizures. Neurosurgey consulted and did not recommend meningioma resection as of 6/27.  Decline in swallow function noted overnight 6/27. PMH: HTN, HLD, GERD, CKD stage IIIb, chronic HFpEF, meningioma, breast CA-s/p right lumpectomy and chemoradiation in 2008, depression/anxiety. BSE 03/11/22: regular texture diet and thin liquids without need for follow up.    Assessment / Plan / Recommendation  Clinical Impression  Pt was seen for bedside swallow evaluation with her husband and sisters present. Pt's husband  reported that the pt had been tolerating a regular texture diet prior to admission, but that intake was reduced due to appetite. Per the husband, pt has been unable to "get food down" for the past two days. Oral mechanism exam was limited due to pt's difficulty following commands; however, oral motor strength and ROM appeared grossly WFL. Evidence was labial trauma noted to the interior mandibular lip. Dentition was natural and adequate. She presented with symptoms of oral phase dysphagia which are likely cognitively based. She demonstrated reduced bolus awareness with oral holding, prolonged mastication, and mild lingual residue. Excessive lingual movement was palpated during instances of oral residue, but no significant change in residue quantity was noted despite this effort. Residue was cleared with a liquid wash. A dysphagia 1 diet with thin liquids will be initiated at this time. SLP will follow to ensure tolerance and for advancement or instrumental assessment as clinically indicated. SLP Visit Diagnosis: Dysphagia, oropharyngeal phase (R13.12)    Aspiration Risk  Mild aspiration risk    Diet Recommendation Thin liquid;Dysphagia 1 (Puree)   Liquid Administration via: Straw;Cup Medication Administration: Whole meds with puree (whole/crush with puree as tolerated) Supervision: Staff to assist with self feeding Compensations: Minimize environmental distractions Postural Changes: Seated upright at 90 degrees    Other  Recommendations Oral Care Recommendations: Oral care BID    Recommendations for follow up therapy are one component of a multi-disciplinary discharge planning process, led by the attending physician.  Recommendations may be updated based on patient status, additional functional criteria and insurance authorization.  Follow up Recommendations  (TBD)      Assistance Recommended at Discharge    Functional Status Assessment Patient has had a recent decline in their functional  status and demonstrates the ability to make significant improvements in function in a reasonable and predictable amount of time.  Frequency and Duration min 2x/week  2 weeks       Prognosis Prognosis for Safe Diet Advancement: Good      Swallow Study   General Date of Onset: 03/18/22 HPI: Pt is a 76 y/o who presented 6/24 with new onset seizures. 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 EEG negative for seizures. Neurosurgey consulted and did not recommend meningioma resection as of 6/27. Decline in swallow function noted overnight 6/27. PMH: HTN, HLD, GERD, CKD stage IIIb, chronic HFpEF, meningioma, breast CA-s/p right lumpectomy and chemoradiation in 2008, depression/anxiety. BSE 03/11/22: regular texture diet and thin liquids without need for follow up. Type of Study: Bedside Swallow Evaluation Previous Swallow Assessment: none Diet Prior to this Study: Regular;Thin liquids Temperature Spikes Noted: No Respiratory Status: Room air History of Recent Intubation: No Behavior/Cognition: Alert;Cooperative;Requires cueing Oral Cavity Assessment: Within Functional Limits Oral Care Completed by SLP: No Oral Cavity - Dentition: Adequate natural dentition Vision: Functional for self-feeding Self-Feeding Abilities: Needs assist Patient Positioning: Upright in bed;Postural control adequate for testing Baseline Vocal Quality: Normal Volitional Cough: Strong Volitional Swallow: Able to elicit    Oral/Motor/Sensory Function Overall Oral Motor/Sensory Function: Within functional limits   Ice Chips Ice chips: Impaired Presentation: Spoon Oral  Phase Impairments: Poor awareness of bolus Oral Phase Functional Implications: Oral holding   Thin Liquid Thin Liquid: Within functional limits Presentation: Cup;Straw    Nectar Thick Nectar Thick Liquid: Not tested   Honey Thick Honey Thick Liquid: Not tested   Puree Puree: Impaired Presentation:  Spoon Oral Phase Impairments: Poor awareness of bolus Oral Phase Functional Implications: Prolonged oral transit;Oral holding   Solid     Solid: Impaired Oral Phase Impairments: Poor awareness of bolus;Impaired mastication Oral Phase Functional Implications: Oral residue;Oral holding;Impaired mastication     Calle Schader I. Hardin Negus, [place], [place] Office number [phone] Pager [phone]  Horton Marshall 03/19/2022,2:55 PM

## 2022-03-19 NOTE — Progress Notes (Signed)
LUE venous duplex has been completed.   Results can be found under chart review under CV PROC. 03/19/2022 4:32 PM Murrell Elizondo RVT, RDMS

## 2022-03-20 ENCOUNTER — Ambulatory Visit: Payer: Medicare Other | Admitting: Family Medicine

## 2022-03-20 DIAGNOSIS — F419 Anxiety disorder, unspecified: Secondary | ICD-10-CM | POA: Diagnosis not present

## 2022-03-20 DIAGNOSIS — D320 Benign neoplasm of cerebral meninges: Secondary | ICD-10-CM | POA: Diagnosis not present

## 2022-03-20 DIAGNOSIS — R569 Unspecified convulsions: Secondary | ICD-10-CM | POA: Diagnosis not present

## 2022-03-20 DIAGNOSIS — K219 Gastro-esophageal reflux disease without esophagitis: Secondary | ICD-10-CM | POA: Diagnosis not present

## 2022-03-20 LAB — COMPREHENSIVE METABOLIC PANEL
ALT: 36 U/L (ref 0–44)
AST: 17 U/L (ref 15–41)
Albumin: 2.6 g/dL — ABNORMAL LOW (ref 3.5–5.0)
Alkaline Phosphatase: 43 U/L (ref 38–126)
Anion gap: 6 (ref 5–15)
BUN: 32 mg/dL — ABNORMAL HIGH (ref 8–23)
CO2: 22 mmol/L (ref 22–32)
Calcium: 8.3 mg/dL — ABNORMAL LOW (ref 8.9–10.3)
Chloride: 105 mmol/L (ref 98–111)
Creatinine, Ser: 1.24 mg/dL — ABNORMAL HIGH (ref 0.44–1.00)
GFR, Estimated: 45 mL/min — ABNORMAL LOW (ref >60)
Glucose, Bld: 128 mg/dL — ABNORMAL HIGH (ref 70–99)
Potassium: 4.1 mmol/L (ref 3.5–5.1)
Sodium: 133 mmol/L — ABNORMAL LOW (ref 135–145)
Total Bilirubin: 0.6 mg/dL (ref 0.3–1.2)
Total Protein: 5.2 g/dL — ABNORMAL LOW (ref 6.5–8.1)

## 2022-03-20 LAB — CBC WITH DIFFERENTIAL/PLATELET
Abs Immature Granulocytes: 0.13 10*3/uL — ABNORMAL HIGH (ref 0.00–0.07)
Basophils Absolute: 0 10*3/uL (ref 0.0–0.1)
Basophils Relative: 0 %
Eosinophils Absolute: 0 10*3/uL (ref 0.0–0.5)
Eosinophils Relative: 0 %
HCT: 24.5 % — ABNORMAL LOW (ref 36.0–46.0)
Hemoglobin: 8.1 g/dL — ABNORMAL LOW (ref 12.0–15.0)
Immature Granulocytes: 1 %
Lymphocytes Relative: 3 %
Lymphs Abs: 0.4 10*3/uL — ABNORMAL LOW (ref 0.7–4.0)
MCH: 28.3 pg (ref 26.0–34.0)
MCHC: 33.1 g/dL (ref 30.0–36.0)
MCV: 85.7 fL (ref 80.0–100.0)
Monocytes Absolute: 0.5 10*3/uL (ref 0.1–1.0)
Monocytes Relative: 4 %
Neutro Abs: 11.5 10*3/uL — ABNORMAL HIGH (ref 1.7–7.7)
Neutrophils Relative %: 92 %
Platelets: 143 10*3/uL — ABNORMAL LOW (ref 150–400)
RBC: 2.86 MIL/uL — ABNORMAL LOW (ref 3.87–5.11)
RDW: 15.7 % — ABNORMAL HIGH (ref 11.5–15.5)
WBC: 12.5 10*3/uL — ABNORMAL HIGH (ref 4.0–10.5)
nRBC: 0 % (ref 0.0–0.2)

## 2022-03-20 LAB — HEPARIN LEVEL (UNFRACTIONATED): Heparin Unfractionated: 1.09 IU/mL — ABNORMAL HIGH (ref 0.30–0.70)

## 2022-03-20 LAB — GLUCOSE, CAPILLARY
Glucose-Capillary: 140 mg/dL — ABNORMAL HIGH (ref 70–99)
Glucose-Capillary: 186 mg/dL — ABNORMAL HIGH (ref 70–99)
Glucose-Capillary: 169 mg/dL — ABNORMAL HIGH (ref 70–99)
Glucose-Capillary: 238 mg/dL — ABNORMAL HIGH (ref 70–99)

## 2022-03-20 LAB — APTT: aPTT: 76 seconds — ABNORMAL HIGH (ref 24–36)

## 2022-03-20 MED ORDER — BIOTENE DRY MOUTH MT LIQD: 15.0000 mL | OROMUCOSAL | Status: DC | PRN

## 2022-03-20 NOTE — Progress Notes (Signed)
Patient ID: Tara [name], female   DOB: Jul 20, 1946, 76 y.o.   MRN: [id number] Will plan on OR so that transfer to rehab is obviated Will give date to family tomorrow Alert and oriented, speech is clear, non fluent No seizures

## 2022-03-20 NOTE — TOC Progression Note (Signed)
Transition of Care [facility]) - Progression Note    Patient Details  Name: Tara [name] MRN: [id number] Date of Birth: 03-01-1946  Transition of Care [facility]) CM/SW [place], [place] Phone Number: 03/20/2022, 3:40 PM  Clinical Narrative:   CSW met with patient and family at bedside, multiple sisters and son. CSW explained new recommendation for SNF placement, and patient refused. Family confronted patient and encouraged her to think about it, she was agreeable to think about it at end of discussion. CSW received permission to fax out referral, preference for [facility]. CSW left message for [facility] to review. CSW to follow.    Expected Discharge Plan: [place] Barriers to Discharge: Continued Medical Work up, Ship broker  Expected Discharge Plan and Services Expected Discharge Plan: [place]   Discharge Planning Services: CM Consult   Living arrangements for the past 2 months: [place]: [place]         Social Determinants of Health (SDOH) Interventions    Readmission Risk Interventions    03/06/2022   12:36 PM  Readmission Risk Prevention Plan  Transportation Screening Complete  HRI or Home Care Consult Complete  Social Work Consult for [place] Planning/Counseling Complete

## 2022-03-20 NOTE — Progress Notes (Signed)
Speech Language Pathology Treatment: Dysphagia  Patient Details Name: Tara [name] MRN: [id number] DOB: 1946/09/21 Today's Date: 03/20/2022 Time: 0938-1829 SLP Time Calculation (min) (ACUTE ONLY): 13 min  Assessment / Plan / Recommendation Clinical Impression  Pt was seen for dysphagia treatment. She was alert and cooperative during the session. Pt's RN reported that the pt's swallow function is improved compared to yesterday, and pt expressed her displeasure with the dysphagia 1 diet. Verbal output was notably increased compared to during the initial evaluation and processing speed was reduced. Pt tolerated regular texture solids and thin liquids without overt s/sx of aspiration. Mastication was mildly prolonged, but oral clearance was adequate and no oral holding was noted. Pt's diet will be advanced to dysphagia 2 solids and thin liquids. SLP will continue to follow pt.     HPI HPI: Pt is a 76 y/o who presented 6/24 with new onset seizures. 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 EEG negative for seizures. Neurosurgey consulted and did not recommend meningioma resection as of 6/27. Decline in swallow function noted overnight 6/27. PMH: HTN, HLD, GERD, CKD stage IIIb, chronic HFpEF, meningioma, breast CA-s/p right lumpectomy and chemoradiation in 2008, depression/anxiety. BSE 03/11/22: regular texture diet and thin liquids without need for follow up.      SLP Plan  Continue with current plan of care      Recommendations for follow up therapy are one component of a multi-disciplinary discharge planning process, led by the attending physician.  Recommendations may be updated based on patient status, additional functional criteria and insurance authorization.    Recommendations  Diet recommendations: Dysphagia 2 (fine chop);Thin liquid Liquids provided via: Cup;Straw Medication Administration: Whole meds with puree (whole/crush with puree as  tolerated) Supervision: Staff to assist with self feeding Compensations: Minimize environmental distractions Postural Changes and/or Swallow Maneuvers: Seated upright 90 degrees                Oral Care Recommendations: Oral care BID Follow Up Recommendations:  (TBD) SLP Visit Diagnosis: Dysphagia, oropharyngeal phase (R13.12) Plan: Continue with current plan of care         Aracelis Ulrey I. Hardin Negus, [place], [place] Office number [phone] Pager [place]  03/20/2022, 9:08 AM

## 2022-03-20 NOTE — Progress Notes (Signed)
ANTICOAGULATION CONSULT NOTE - Follow Up Consult  Pharmacy Consult for heparin Indication: atrial fibrillation  Labs: Recent Labs    03/18/22 0335 03/19/22 0309 03/19/22 1159 03/20/22 0451  HGB  --   --   --  8.1*  HCT  --   --   --  24.5*  PLT  --   --   --  143*  APTT 87* 59* 76* 76*  HEPARINUNFRC >1.10* >1.10*  --  1.09*  CREATININE 1.43*  --   --  1.24*     Assessment: 76yo female therapeutic on heparin  Goal of Therapy:  aPTT 66-102 seconds   Plan:  Heparin at 550 units / hr Follow up AM labs  Thank you Anette Guarneri, PharmD  03/20/2022,7:51 AM

## 2022-03-20 NOTE — Progress Notes (Signed)
PROGRESS NOTE        PATIENT DETAILS Name: Tara [name] Age: 76 y.o. Sex: female Date of Birth: 09-29-1945 Admit Date: 03/15/2022 Admitting Physician Rolla Plate, DO YCX:[id number], Norwood Levo, MD  Brief Summary: Patient is a 76 y.o.  female with a history of HTN, HLD, CKD stage IIIb, chronic HFpEF, meningioma, breast CA-s/p right lumpectomy and chemoradiation in 2008, depression/anxiety who was hospitalized from 6/8-6/22 for AKI/A-fib RVR with hypotension-presented to [facility] ED with new onset seizures.    Significant events: 6/08-6/22>> hospitalization for weakness-AKI-A-fib RVR with hypotension-meningioma-subsequently discharged home after stabilization.   6/24>> presented to [facility] ED with seizure-transferred to [facility].  Significant studies: 6/08>> CT head: Interval increase in size of previously noted planum sphenoidal meningioma. 6/08>> CT C-spine: No fracture or dislocation 6/09>> Echo: EF 55% 6/12>> TSH: 0.429 6/13>> CXR: No PNA 6/24>> CT head: No acute process-stable sphenoidal meningioma 2.4 x 3.3 x 2.8 cm-hypodense lesion is seen in the frontal lobe medially to the adjacent mass-representing edema/infarct. 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 6/28>> Keppra level: Pending  Significant microbiology data: 6/08>> influenza/COVID PCR: Negative 6/13>> blood culture: Negative  Procedures: None  Consults: Neurology Neurosurgery  Subjective: Is much more awake and alert today compared to yesterday.  Ate almost all of breakfast this morning.  Is moving her lower extremities more fluently today.  Objective: Vitals: Blood pressure 115/67, pulse 74, temperature 98.6 F (37 C), temperature source Oral, resp. rate 18, height '4\' 9"'$  (1.448 m), weight 85  kg, SpO2 96 %.   Exam: Gen Exam:Alert awake-not in any distress HEENT:atraumatic, normocephalic Chest: B/L clear to auscultation anteriorly CVS:S1S2 regular Abdomen:soft non tender, non distended Extremities:no edema Neurology: Non focal Skin: no rash   Pertinent Labs/Radiology:    Latest Ref Rng & Units 03/20/2022    4:51 AM 03/17/2022    3:43 AM 03/16/2022    3:57 AM  CBC  WBC 4.0 - 10.5 K/uL 12.5  18.6  13.5   Hemoglobin 12.0 - 15.0 g/dL 8.1  7.9  7.7   Hematocrit 36.0 - 46.0 % 24.5  23.6  23.4   Platelets 150 - 400 K/uL 143  159  143     Lab Results  Component Value Date   NA 133 (L) 03/20/2022   K 4.1 03/20/2022   CL 105 03/20/2022   CO2 22 03/20/2022     Assessment/Plan: New onset seizure: No further seizures-on Keppra/Vimpat.  Due to concern for sedation/lethargy/difficulty ambulation-Keppra dose reduced to 500 mg p.o. twice daily on 6/28.  She appears much more alert today compared to yesterday-and is moving her lower extremities more fluently today.  Question to whether longstanding meningioma is provoking seizure disorder-neurosurgery not convinced that this is the case  Meningioma: See above regarding possibility for seizures to be provoked from meningioma-on Decadron-neurosurgery following-see above  PAF: Maintaining sinus rhythm-continue amiodarone-Eliquis stopped on 6/24 in anticipation of craniotomy-currently on IV heparin.  If neurosurgery does not plan on craniotomy-then we can switch back to Eliquis.  Chronic HFpEF: Volume status stable  HTN: BP stable without use of any antihypertensives  HLD: Continue Crestor  CKD stage IIIb: At baseline-watch closely.  Normocytic anemia: Due to CKD-hemoglobin stable-we will  transfuse if significant drop.  Leukocytosis: No indication of infection-likely due to steroid use.  Depression/anxiety: Continue Zoloft/Klonopin  GERD: Continue PPI  Prediabetes (A1c 6.3 on 6/8)-with steroid-induced hyperglycemia: Monitor  CBGs on SSI.  Recent Labs    03/19/22 2119 03/20/22 0624 03/20/22 1155  GLUCAP 133* 140* 186*    Back pain: Likely musculoskeletal in etiology-MRI of the lumbosacral spine without any significant pathology.    Acute on chronic debility/deconditioning: Appears weak/debilitated-family refused SNF last admit and took her home-suspect she would most benefit from going to subacute rehab on discharge-spouse aware of recommendations.  Morbid Obesity: Estimated body mass index is 40.55 kg/m as calculated from the following:   Height as of this encounter: '4\' 9"'$  (1.448 m).   Weight as of this encounter: 85 kg.    Code status:   Code Status: Full Code   DVT Prophylaxis: SCDs Start: 03/15/22 0454 IV heparin    Family Communication: Spouse at bedside  Disposition Plan: Status is: Inpatient Remains inpatient appropriate because: New onset seizure-craniotomy later this week-will require several more days of hospitalization.   Planned Discharge Destination:Skilled nursing facility versus home health.  Diet: Diet Order             DIET - DYS 1 Room service appropriate? Yes with Assist; Fluid consistency: Thin  Diet effective now                     Antimicrobial agents: Anti-infectives (From admission, onward)    None        MEDICATIONS: Scheduled Meds:  amiodarone  200 mg Oral Daily   clonazePAM  0.5 mg Oral QHS   dexamethasone (DECADRON) injection  4 mg Intravenous Q6H   insulin aspart  0-5 Units Subcutaneous QHS   insulin aspart  0-9 Units Subcutaneous TID WC   lacosamide  100 mg Oral BID   levETIRAcetam  500 mg Oral BID   montelukast  10 mg Oral QHS   pantoprazole  40 mg Oral BID   rosuvastatin  20 mg Oral Daily   sertraline  25 mg Oral Daily   Continuous Infusions:  heparin 550 Units/hr (03/19/22 0600)   PRN Meds:.acetaminophen **OR** acetaminophen, guaiFENesin, hydrALAZINE, metoprolol tartrate, morphine injection, ondansetron **OR** ondansetron  (ZOFRAN) IV, oxyCODONE, senna-docusate   I have personally reviewed following labs and imaging studies  LABORATORY DATA: CBC: Recent Labs  Lab 03/15/22 0200 03/16/22 0357 03/17/22 0343 03/20/22 0451  WBC 18.2* 13.5* 18.6* 12.5*  NEUTROABS 16.5*  --   --  11.5*  HGB 8.3* 7.7* 7.9* 8.1*  HCT 26.0* 23.4* 23.6* 24.5*  MCV 89.3 86.7 86.8 85.7  PLT 155 143* 159 143*     Basic Metabolic Panel: Recent Labs  Lab 03/15/22 0200 03/15/22 0530 03/16/22 0357 03/18/22 0335 03/20/22 0451  NA 134*  --  132* 134* 133*  K 3.5  --  3.7 4.0 4.1  CL 102  --  104 103 105  CO2 24  --  21* 22 22  GLUCOSE 105*  --  135* 129* 128*  BUN 27*  --  24* 40* 32*  CREATININE 1.63*  --  1.11* 1.43* 1.24*  CALCIUM 8.2*  --  8.4* 8.9 8.3*  MG  --  1.7 1.9  --   --      GFR: Estimated Creatinine Clearance: 34.9 mL/min (A) (by C-G formula based on SCr of 1.24 mg/dL (H)).  Liver Function Tests: Recent Labs  Lab 03/15/22 0200 03/20/22 0451  AST 22  17  ALT 32 36  ALKPHOS 62 43  BILITOT 0.5 0.6  PROT 5.7* 5.2*  ALBUMIN 2.8* 2.6*    No results for input(s): "LIPASE", "AMYLASE" in the last 168 hours.  No results for input(s): "AMMONIA" in the last 168 hours.  Coagulation Profile: No results for input(s): "INR", "PROTIME" in the last 168 hours.   Cardiac Enzymes: Recent Labs  Lab 03/15/22 0200  CKTOTAL 118     BNP (last 3 results) No results for input(s): "PROBNP" in the last 8760 hours.  Lipid Profile: No results for input(s): "CHOL", "HDL", "LDLCALC", "TRIG", "CHOLHDL", "LDLDIRECT" in the last 72 hours.  Thyroid Function Tests: No results for input(s): "TSH", "T4TOTAL", "FREET4", "T3FREE", "THYROIDAB" in the last 72 hours.   Anemia Panel: No results for input(s): "VITAMINB12", "FOLATE", "FERRITIN", "TIBC", "IRON", "RETICCTPCT" in the last 72 hours.  Urine analysis:    Component Value Date/Time   COLORURINE YELLOW 03/15/2022 0832   APPEARANCEUR CLEAR 03/15/2022 0832    LABSPEC 1.014 03/15/2022 0832   PHURINE 6.0 03/15/2022 0832   GLUCOSEU >=500 (A) 03/15/2022 0832   HGBUR SMALL (A) 03/15/2022 0832   BILIRUBINUR NEGATIVE 03/15/2022 0832   KETONESUR NEGATIVE 03/15/2022 0832   PROTEINUR 30 (A) 03/15/2022 0832   UROBILINOGEN 0.2 02/07/2013 1859   NITRITE NEGATIVE 03/15/2022 0832   LEUKOCYTESUR NEGATIVE 03/15/2022 0832    Sepsis Labs: Lactic Acid, Venous    Component Value Date/Time   LATICACIDVEN 1.7 03/03/2022 1340    MICROBIOLOGY: No results found for this or any previous visit (from the past 240 hour(s)).   RADIOLOGY STUDIES/RESULTS: MR LUMBAR SPINE WO CONTRAST  Result Date: 03/19/2022 CLINICAL DATA:  Initial evaluation for acute myelopathy. EXAM: MRI LUMBAR SPINE WITHOUT CONTRAST TECHNIQUE: Multiplanar, multisequence MR imaging of the lumbar spine was performed. No intravenous contrast was administered. COMPARISON:  None Available. FINDINGS: Segmentation: Standard. Lowest well-formed disc space labeled the L5-S1 level. Alignment: Physiologic with preservation of the normal lumbar lordosis. No listhesis. Vertebrae: Vertebral body height maintained without acute or chronic fracture. Bone marrow signal intensity within normal limits. No worrisome osseous lesions. No abnormal marrow edema. Conus medullaris and cauda equina: Conus extends to the L1 level. Conus and cauda equina appear normal. Paraspinal and other soft tissues: Paraspinous soft tissues within normal limits. 1.3 cm simple left renal cyst noted, benign in appearance, no follow-up imaging recommended. Disc levels: T11-12: Mild disc bulge with facet hypertrophy. No significant canal or foraminal stenosis. T12-L1: Unremarkable. L1-2:  Negative interspace.  Mild facet hypertrophy.  No stenosis. L2-3: Disc desiccation without significant disc bulge. Mild facet hypertrophy. No stenosis. L3-4: Mild circumferential disc bulge and disc desiccation. Associated reactive endplate spurring. Mild facet and  ligament flavum hypertrophy. Borderline mild narrowing of the lateral recesses. Central canal remains patent. Mild right L3 foraminal stenosis. Left neural foramen remains patent. L4-5: Disc bulge with disc desiccation. Mild to moderate facet and ligament flavum hypertrophy. Resultant mild bilateral subarticular stenosis. Central canal remains patent. Mild left greater than right L4 foraminal narrowing. L5-S1: Disc desiccation with shallow posterior disc bulge. Moderate right greater than left facet hypertrophy. Epidural lipomatosis. No significant spinal stenosis. Mild bilateral L5 foraminal stenosis. IMPRESSION: 1. No acute abnormality within the lumbar spine. Conus medullaris and nerve roots of the cauda equina are within normal limits. 2. Mild disc bulging with facet hypertrophy at L3-4 and L4-5 with resultant mild bilateral subarticular stenosis. No overt neural impingement. 3. Mild right L3 with bilateral L4 and L5 foraminal stenosis related to disc bulge,  reactive endplate spurring, and facet hypertrophy. 4. Mild-to-moderate multilevel facet hypertrophy, most pronounced at L5-S1. Electronically Signed   By: Jeannine Boga M.D.   On: 03/19/2022 19:23   VAS [place] UPPER EXTREMITY VENOUS DUPLEX  Result Date: 03/19/2022 UPPER VENOUS STUDY  Patient Name:  Tara [name]  Date of Exam:   03/19/2022 Medical Rec #: [id number]       Accession #:    [id number] Date of Birth: 03-Nov-1945       Patient Gender: F Patient Age:   5 years Exam Location:  [facility] Procedure:      VAS [place] UPPER EXTREMITY VENOUS DUPLEX Referring Phys: Oren Binet --------------------------------------------------------------------------------  Indications: Swelling Limitations: Poor ultrasound/tissue interface and limited mobility. Comparison Study: No previous exams Performing Technologist: Jody Hill RVT, RDMS  Examination Guidelines: A complete evaluation includes B-mode imaging, spectral Doppler, color Doppler, and power  Doppler as needed of all accessible portions of each vessel. Bilateral testing is considered an integral part of a complete examination. Limited examinations for reoccurring indications may be performed as noted.  Right Findings: +----------+------------+---------+-----------+----------+-------+ RIGHT     CompressiblePhasicitySpontaneousPropertiesSummary +----------+------------+---------+-----------+----------+-------+ Subclavian    Full       Yes       Yes                      +----------+------------+---------+-----------+----------+-------+  Left Findings: +----------+------------+---------+-----------+----------+--------------+ LEFT      CompressiblePhasicitySpontaneousProperties   Summary     +----------+------------+---------+-----------+----------+--------------+ IJV           Full       Yes       Yes                             +----------+------------+---------+-----------+----------+--------------+ Subclavian    Full       Yes       Yes                             +----------+------------+---------+-----------+----------+--------------+ Axillary      Full       Yes       Yes                             +----------+------------+---------+-----------+----------+--------------+ Brachial      Full       Yes       Yes                             +----------+------------+---------+-----------+----------+--------------+ Radial        Full                                                 +----------+------------+---------+-----------+----------+--------------+ Ulnar                                               Not visualized +----------+------------+---------+-----------+----------+--------------+ Cephalic      Full                                                 +----------+------------+---------+-----------+----------+--------------+  Basilic       Full       Yes       Yes                              +----------+------------+---------+-----------+----------+--------------+  Summary:  Right: No evidence of thrombosis in the subclavian.  Left: No evidence of deep vein thrombosis in the upper extremity. No evidence of superficial vein thrombosis in the upper extremity. However, unable to visualize the ulnar veins.  *See table(s) above for measurements and observations.  Diagnosing physician: Orlie Pollen Electronically signed by Orlie Pollen on 03/19/2022 at 5:38:50 PM.    Final      LOS: 5 days   Oren Binet, MD  [facility]    To contact the attending provider between 7A-7P or the covering provider during after hours 7P-7A, please log into the web site [email or web address] and access using universal [place] password for that web site. If you do not have the password, please call the hospital operator.  03/20/2022, 1:25 PM

## 2022-03-21 ENCOUNTER — Other Ambulatory Visit: Payer: Self-pay | Admitting: Neurosurgery

## 2022-03-21 ENCOUNTER — Encounter (HOSPITAL_COMMUNITY): Payer: Medicare Other

## 2022-03-21 DIAGNOSIS — E440 Moderate protein-calorie malnutrition: Secondary | ICD-10-CM | POA: Diagnosis not present

## 2022-03-21 DIAGNOSIS — D320 Benign neoplasm of cerebral meninges: Secondary | ICD-10-CM | POA: Diagnosis not present

## 2022-03-21 DIAGNOSIS — F419 Anxiety disorder, unspecified: Secondary | ICD-10-CM | POA: Diagnosis not present

## 2022-03-21 DIAGNOSIS — R569 Unspecified convulsions: Secondary | ICD-10-CM | POA: Diagnosis not present

## 2022-03-21 LAB — LEVETIRACETAM LEVEL: Levetiracetam Lvl: 37.1 ug/mL (ref 10.0–40.0)

## 2022-03-21 LAB — GLUCOSE, CAPILLARY
Glucose-Capillary: 173 mg/dL — ABNORMAL HIGH (ref 70–99)
Glucose-Capillary: 197 mg/dL — ABNORMAL HIGH (ref 70–99)
Glucose-Capillary: 149 mg/dL — ABNORMAL HIGH (ref 70–99)
Glucose-Capillary: 186 mg/dL — ABNORMAL HIGH (ref 70–99)

## 2022-03-21 LAB — APTT: aPTT: 63 seconds — ABNORMAL HIGH (ref 24–36)

## 2022-03-21 LAB — HEPARIN LEVEL (UNFRACTIONATED): Heparin Unfractionated: 1.02 IU/mL — ABNORMAL HIGH (ref 0.30–0.70)

## 2022-03-21 NOTE — Progress Notes (Signed)
Patient ID: Tara [name], female   DOB: 10-31-1945, 76 y.o.   MRN: [id number] BP 126/68 (BP Location: Left Arm)   Pulse 77   Temp 97.7 F (36.5 C) (Oral)   Resp 20   Ht '4\' 9"'$  (1.448 m)   Wt 85 kg   SpO2 97%   BMI 40.55 kg/m  OR scheduled for Monday Son states she is not speaking as much today Her waxing and waning exam is not explained by the tumor, or her recent seizure Resection of the the mass will answer that question.

## 2022-03-21 NOTE — Progress Notes (Signed)
Occupational Therapy Treatment Patient Details Name: Tara [name] MRN: [id number] DOB: 06-06-1946 Today's Date: 03/21/2022   History of present illness 76 yo woman admitted 03/15/22 with new onset seizures related to meningioma. Per NSU, likely resection/craniotomy next week. Recent DC from [facility] 6/22.PMH: CKD stage 3, HFpEF, a fib,  meningioma.   OT comments  Pt. Seen for skilled OT treatment.  Pt. Awake but kept eyes closed throughout session.  Answering questions appropriately but only spoke when spoken to, not active conversation.  Max hand over hand assistance for bed level grooming task.  BUE AROM to aide in cont. Strengthening and function for adl participation.  Note sx. Likely next week.  Agree with current d/c recommendations.    Recommendations for follow up therapy are one component of a multi-disciplinary discharge planning process, led by the attending physician.  Recommendations may be updated based on patient status, additional functional criteria and insurance authorization.    Follow Up Recommendations  Skilled nursing-short term rehab (<3 hours/day)    Assistance Recommended at Discharge Frequent or constant Supervision/Assistance  Patient can return home with the following  Two people to help with walking and/or transfers;A lot of help with bathing/dressing/bathroom;Assistance with cooking/housework;Direct supervision/assist for medications management;Direct supervision/assist for financial management;Assist for transportation;Help with stairs or ramp for entrance   Equipment Recommendations  None recommended by OT;Other (comment)    Recommendations for Other Services      Precautions / Restrictions Precautions Precautions: Fall Precaution Comments: LUE edema       Mobility Bed Mobility                    Transfers                         Balance                                           ADL either performed or assessed  with clinical judgement   ADL Overall ADL's : Needs assistance/impaired     Grooming: Maximal assistance;Bed level;Wash/dry face Grooming Details (indicate cue type and reason): max hand over hand assistance to bring wash cloth to face LUE.                                    Extremity/Trunk Assessment              Vision       Perception     Praxis      Cognition Arousal/Alertness: Lethargic Behavior During Therapy: Flat affect                                   General Comments: eyes closed but awake. answering all question appropriately but would only open eyes occasionally and not keep them open even with max encouragement        Exercises General Exercises - Upper Extremity Elbow Flexion: AROM, Both, 10 reps, Supine Elbow Extension: AROM, Both, 10 reps, Supine    Shoulder Instructions       General Comments      Pertinent Vitals/ Pain       Pain Assessment Pain Assessment: Faces Pain Score: 0-No pain Faces Pain Scale: No hurt  Home Living                                          Prior Functioning/Environment              Frequency  Min 2X/week        Progress Toward Goals  OT Goals(current goals can now be found in the care plan section)  Progress towards OT goals: Progressing toward goals     Plan Frequency remains appropriate;Discharge plan needs to be updated    Co-evaluation                 AM-PAC OT "6 Clicks" Daily Activity     Outcome Measure   Help from another person eating meals?: A Little Help from another person taking care of personal grooming?: A Little Help from another person toileting, which includes using toliet, bedpan, or urinal?: Total Help from another person bathing (including washing, rinsing, drying)?: A Lot Help from another person to put on and taking off regular upper body clothing?: A Lot Help from another person to put on and taking off regular  lower body clothing?: Total 6 Click Score: 12    End of Session    OT Visit Diagnosis: Unsteadiness on feet (R26.81);Other abnormalities of gait and mobility (R26.89);Muscle weakness (generalized) (M62.81);Other symptoms and signs involving the nervous system (R29.898);Other symptoms and signs involving cognitive function   Activity Tolerance Patient limited by lethargy   Patient Left in bed;with call bell/phone within reach;with bed alarm set   Nurse Communication Other (comment) (rn states pt. okay to work with therapy today)        Time: 2023-3435 OT Time Calculation (min): 8 min  Charges: OT General Charges $OT Visit: 1 Visit OT Treatments $Therapeutic Activity: 8-22 mins  Sonia Baller, COTA/L Acute Rehabilitation [phone]   Tanya Nones 03/21/2022, 11:45 AM

## 2022-03-21 NOTE — Progress Notes (Signed)
ANTICOAGULATION CONSULT NOTE - Follow Up Consult  Pharmacy Consult for heparin Indication: atrial fibrillation  Labs: Recent Labs    03/19/22 0309 03/19/22 1159 03/20/22 0451 03/21/22 0232  HGB  --   --  8.1*  --   HCT  --   --  24.5*  --   PLT  --   --  143*  --   APTT 59* 76* 76* 63*  HEPARINUNFRC >1.10*  --  1.09* 1.02*  CREATININE  --   --  1.24*  --      Assessment: 76yo female continues on heparin for Afib while apixaban on hold awaiting plans for surgery  PTT 63 seconds this AM  Goal of Therapy:  aPTT 66-102 seconds   Plan:  Heparin to 600 units / hr Follow up AM labs  Thank you Anette Guarneri, PharmD  03/21/2022,7:41 AM

## 2022-03-21 NOTE — Progress Notes (Signed)
PROGRESS NOTE        PATIENT DETAILS Name: Tara [name] Age: 76 y.o. Sex: female Date of Birth: [birth year] Admit Date: 03/15/2022 Admitting Physician Rolla Plate, DO MPN:TIRWERX, Norwood Levo, MD  Brief Summary: Patient is a 76 y.o.  female with a history of HTN, HLD, CKD stage IIIb, chronic HFpEF, meningioma, breast CA-s/p right lumpectomy and chemoradiation in 2008, depression/anxiety who was hospitalized from 6/8-6/22 for AKI/A-fib RVR with hypotension-presented to [facility] ED with new onset seizures.    Significant events: 6/08-6/22>> hospitalization for weakness-AKI-A-fib RVR with hypotension-meningioma-subsequently discharged home after stabilization.   6/24>> presented to [facility] ED with seizure-transferred to [facility].  Significant studies: 6/08>> CT head: Interval increase in size of previously noted planum sphenoidal meningioma. 6/08>> CT C-spine: No fracture or dislocation 6/09>> Echo: EF 55% 6/12>> TSH: 0.429 6/13>> CXR: No PNA 6/24>> CT head: No acute process-stable sphenoidal meningioma 2.4 x 3.3 x 2.8 cm-hypodense lesion is seen in the frontal lobe medially to the adjacent mass-representing edema/infarct. 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 6/28>> Keppra level: Pending  Significant microbiology data: 6/08>> influenza/COVID PCR: Negative 6/13>> blood culture: Negative  Procedures: None  Consults: Neurology Neurosurgery  Subjective: Keeps eyes closed but awake/alert-answering questions appropriately.  Per spouse who is at bedside-patient apparently was very interactive yesterday-and ate almost all meals.  Objective: Vitals: Blood pressure 130/63, pulse 85, temperature 98 F (36.7 C), resp. rate 16, height '4\' 9"'$  (1.448 m), weight 85 kg,  SpO2 97 %.   Exam: Gen Exam:Alert awake-not in any distress HEENT:atraumatic, normocephalic Chest: B/L clear to auscultation anteriorly CVS:S1S2 regular Abdomen:soft non tender, non distended Extremities:no edema Neurology: Non focal-but with generalized weakness. Skin: no rash   Pertinent Labs/Radiology:    Latest Ref Rng & Units 03/20/2022    4:51 AM 03/17/2022    3:43 AM 03/16/2022    3:57 AM  CBC  WBC 4.0 - 10.5 K/uL 12.5  18.6  13.5   Hemoglobin 12.0 - 15.0 g/dL 8.1  7.9  7.7   Hematocrit 36.0 - 46.0 % 24.5  23.6  23.4   Platelets 150 - 400 K/uL 143  159  143     Lab Results  Component Value Date   NA 133 (L) 03/20/2022   K 4.1 03/20/2022   CL 105 03/20/2022   CO2 22 03/20/2022     Assessment/Plan: New onset seizure: No further seizures-on Keppra/Vimpat.  Due to concern for sedation/lethargy/difficulty ambulation-Keppra dose reduced to 500 mg p.o. twice daily on 6/28.  She is relatively awake and alert-and moving all 4 extremities.   Meningioma: Neurosurgery not convinced whether this longstanding meningioma is provoking seizures-she remains on IV Decadron.  Per neurosurgery-Dr. Christella Noa note on 6/29-he will formulate a definite plan for surgery soon.  Unclear whether patient will need to remain inpatient for surgery or can be done in outpatient-neurosurgery to provide further clarification.  Acute toxic metabolic encephalopathy: Likely due to Keppra-much better after adjustment of dose on 6/28-see above  PAF: Maintaining sinus rhythm-continue amiodarone-Eliquis stopped on 6/24 in anticipation of craniotomy-currently on IV heparin.    Chronic HFpEF: Volume status stable  HTN: BP stable without use of any antihypertensives  HLD: Continue Crestor  CKD stage IIIb: At baseline-watch closely.  Normocytic anemia: Due to CKD-hemoglobin stable-we will transfuse if significant drop.  Leukocytosis: No indication of infection-likely due to steroid use.  Depression/anxiety:  Continue Zoloft/Klonopin  GERD: Continue PPI  Prediabetes (A1c 6.3 on 6/8)-with steroid-induced hyperglycemia: Monitor CBGs on SSI.  Recent Labs    03/20/22 2117 03/21/22 0602 03/21/22 1201  GLUCAP 238* 173* 197*    Back pain: Likely musculoskeletal in etiology-MRI of the lumbosacral spine without any significant pathology.    Acute on chronic debility/deconditioning: Appears weak/debilitated-family refused SNF last admit and took her home-suspect she would most benefit from going to subacute rehab on discharge-spouse aware of recommendations.  Morbid Obesity: Estimated body mass index is 40.55 kg/m as calculated from the following:   Height as of this encounter: '4\' 9"'$  (1.448 m).   Weight as of this encounter: 85 kg.    Code status:   Code Status: Full Code   DVT Prophylaxis: SCDs Start: 03/15/22 0454 IV heparin    Family Communication: Spouse at bedside  Disposition Plan: Status is: Inpatient Remains inpatient appropriate because: New onset seizure-craniotomy later this week-will require several more days of hospitalization.   Planned Discharge Destination:Skilled nursing facility   Diet: Diet Order             DIET - DYS 1 Room service appropriate? Yes with Assist; Fluid consistency: Thin  Diet effective now                     Antimicrobial agents: Anti-infectives (From admission, onward)    None        MEDICATIONS: Scheduled Meds:  amiodarone  200 mg Oral Daily   clonazePAM  0.5 mg Oral QHS   dexamethasone (DECADRON) injection  4 mg Intravenous Q6H   insulin aspart  0-5 Units Subcutaneous QHS   insulin aspart  0-9 Units Subcutaneous TID WC   lacosamide  100 mg Oral BID   levETIRAcetam  500 mg Oral BID   montelukast  10 mg Oral QHS   pantoprazole  40 mg Oral BID   rosuvastatin  20 mg Oral Daily   sertraline  25 mg Oral Daily   Continuous Infusions:  heparin 600 Units/hr (03/21/22 0748)   PRN Meds:.acetaminophen **OR** acetaminophen,  antiseptic oral rinse, guaiFENesin, hydrALAZINE, metoprolol tartrate, morphine injection, ondansetron **OR** ondansetron (ZOFRAN) IV, oxyCODONE, senna-docusate   I have personally reviewed following labs and imaging studies  LABORATORY DATA: CBC: Recent Labs  Lab 03/15/22 0200 03/16/22 0357 03/17/22 0343 03/20/22 0451  WBC 18.2* 13.5* 18.6* 12.5*  NEUTROABS 16.5*  --   --  11.5*  HGB 8.3* 7.7* 7.9* 8.1*  HCT 26.0* 23.4* 23.6* 24.5*  MCV 89.3 86.7 86.8 85.7  PLT 155 143* 159 143*     Basic Metabolic Panel: Recent Labs  Lab 03/15/22 0200 03/15/22 0530 03/16/22 0357 03/18/22 0335 03/20/22 0451  NA 134*  --  132* 134* 133*  K 3.5  --  3.7 4.0 4.1  CL 102  --  104 103 105  CO2 24  --  21* 22 22  GLUCOSE 105*  --  135* 129* 128*  BUN 27*  --  24* 40* 32*  CREATININE 1.63*  --  1.11* 1.43* 1.24*  CALCIUM 8.2*  --  8.4* 8.9 8.3*  MG  --  1.7 1.9  --   --      GFR: Estimated Creatinine Clearance: 34.9 mL/min (A) (by C-G formula based on SCr of 1.24 mg/dL (H)).  Liver Function Tests: Recent Labs  Lab 03/15/22 0200 03/20/22 0451  AST 22 17  ALT 32 36  ALKPHOS 62 43  BILITOT 0.5 0.6  PROT 5.7* 5.2*  ALBUMIN 2.8* 2.6*    No results for input(s): "LIPASE", "AMYLASE" in the last 168 hours.  No results for input(s): "AMMONIA" in the last 168 hours.  Coagulation Profile: No results for input(s): "INR", "PROTIME" in the last 168 hours.   Cardiac Enzymes: Recent Labs  Lab 03/15/22 0200  CKTOTAL 118     BNP (last 3 results) No results for input(s): "PROBNP" in the last 8760 hours.  Lipid Profile: No results for input(s): "CHOL", "HDL", "LDLCALC", "TRIG", "CHOLHDL", "LDLDIRECT" in the last 72 hours.  Thyroid Function Tests: No results for input(s): "TSH", "T4TOTAL", "FREET4", "T3FREE", "THYROIDAB" in the last 72 hours.   Anemia Panel: No results for input(s): "VITAMINB12", "FOLATE", "FERRITIN", "TIBC", "IRON", "RETICCTPCT" in the last 72 hours.  Urine  analysis:    Component Value Date/Time   COLORURINE YELLOW 03/15/2022 0832   APPEARANCEUR CLEAR 03/15/2022 0832   LABSPEC 1.014 03/15/2022 0832   PHURINE 6.0 03/15/2022 0832   GLUCOSEU >=500 (A) 03/15/2022 0832   HGBUR SMALL (A) 03/15/2022 0832   BILIRUBINUR NEGATIVE 03/15/2022 0832   KETONESUR NEGATIVE 03/15/2022 0832   PROTEINUR 30 (A) 03/15/2022 0832   UROBILINOGEN 0.2 02/07/2013 1859   NITRITE NEGATIVE 03/15/2022 0832   LEUKOCYTESUR NEGATIVE 03/15/2022 0832    Sepsis Labs: Lactic Acid, Venous    Component Value Date/Time   LATICACIDVEN 1.7 03/03/2022 1340    MICROBIOLOGY: No results found for this or any previous visit (from the past 240 hour(s)).   RADIOLOGY STUDIES/RESULTS: MR LUMBAR SPINE WO CONTRAST  Result Date: 03/19/2022 CLINICAL DATA:  Initial evaluation for acute myelopathy. EXAM: MRI LUMBAR SPINE WITHOUT CONTRAST TECHNIQUE: Multiplanar, multisequence MR imaging of the lumbar spine was performed. No intravenous contrast was administered. COMPARISON:  None Available. FINDINGS: Segmentation: Standard. Lowest well-formed disc space labeled the L5-S1 level. Alignment: Physiologic with preservation of the normal lumbar lordosis. No listhesis. Vertebrae: Vertebral body height maintained without acute or chronic fracture. Bone marrow signal intensity within normal limits. No worrisome osseous lesions. No abnormal marrow edema. Conus medullaris and cauda equina: Conus extends to the L1 level. Conus and cauda equina appear normal. Paraspinal and other soft tissues: Paraspinous soft tissues within normal limits. 1.3 cm simple left renal cyst noted, benign in appearance, no follow-up imaging recommended. Disc levels: T11-12: Mild disc bulge with facet hypertrophy. No significant canal or foraminal stenosis. T12-L1: Unremarkable. L1-2:  Negative interspace.  Mild facet hypertrophy.  No stenosis. L2-3: Disc desiccation without significant disc bulge. Mild facet hypertrophy. No  stenosis. L3-4: Mild circumferential disc bulge and disc desiccation. Associated reactive endplate spurring. Mild facet and ligament flavum hypertrophy. Borderline mild narrowing of the lateral recesses. Central canal remains patent. Mild right L3 foraminal stenosis. Left neural foramen remains patent. L4-5: Disc bulge with disc desiccation. Mild to moderate facet and ligament flavum hypertrophy. Resultant mild bilateral subarticular stenosis. Central canal remains patent. Mild left greater than right L4 foraminal narrowing. L5-S1: Disc desiccation with shallow posterior disc bulge. Moderate right greater than left facet hypertrophy. Epidural lipomatosis. No significant spinal stenosis. Mild bilateral L5 foraminal stenosis. IMPRESSION: 1. No acute abnormality within the lumbar spine. Conus medullaris and nerve roots of the cauda equina are within normal limits. 2. Mild disc bulging with facet hypertrophy at L3-4 and L4-5 with resultant mild bilateral subarticular stenosis. No overt neural impingement. 3.  Mild right L3 with bilateral L4 and L5 foraminal stenosis related to disc bulge, reactive endplate spurring, and facet hypertrophy. 4. Mild-to-moderate multilevel facet hypertrophy, most pronounced at L5-S1. Electronically Signed   By: Jeannine Boga M.D.   On: 03/19/2022 19:23   VAS [place] UPPER EXTREMITY VENOUS DUPLEX  Result Date: 03/19/2022 UPPER VENOUS STUDY  Patient Name:  Tara [name]  Date of Exam:   03/19/2022 Medical Rec #: [id number]       Accession #:    [id number] Date of Birth: 09-Sep-1946       Patient Gender: F Patient Age:   9 years Exam Location:  [facility] Procedure:      VAS [place] UPPER EXTREMITY VENOUS DUPLEX Referring Phys: Oren Binet --------------------------------------------------------------------------------  Indications: Swelling Limitations: Poor ultrasound/tissue interface and limited mobility. Comparison Study: No previous exams Performing Technologist: Jody Hill RVT,  RDMS  Examination Guidelines: A complete evaluation includes B-mode imaging, spectral Doppler, color Doppler, and power Doppler as needed of all accessible portions of each vessel. Bilateral testing is considered an integral part of a complete examination. Limited examinations for reoccurring indications may be performed as noted.  Right Findings: +----------+------------+---------+-----------+----------+-------+ RIGHT     CompressiblePhasicitySpontaneousPropertiesSummary +----------+------------+---------+-----------+----------+-------+ Subclavian    Full       Yes       Yes                      +----------+------------+---------+-----------+----------+-------+  Left Findings: +----------+------------+---------+-----------+----------+--------------+ LEFT      CompressiblePhasicitySpontaneousProperties   Summary     +----------+------------+---------+-----------+----------+--------------+ IJV           Full       Yes       Yes                             +----------+------------+---------+-----------+----------+--------------+ Subclavian    Full       Yes       Yes                             +----------+------------+---------+-----------+----------+--------------+ Axillary      Full       Yes       Yes                             +----------+------------+---------+-----------+----------+--------------+ Brachial      Full       Yes       Yes                             +----------+------------+---------+-----------+----------+--------------+ Radial        Full                                                 +----------+------------+---------+-----------+----------+--------------+ Ulnar                                               Not visualized +----------+------------+---------+-----------+----------+--------------+ Cephalic      Full                                                  +----------+------------+---------+-----------+----------+--------------+  Basilic       Full       Yes       Yes                             +----------+------------+---------+-----------+----------+--------------+  Summary:  Right: No evidence of thrombosis in the subclavian.  Left: No evidence of deep vein thrombosis in the upper extremity. No evidence of superficial vein thrombosis in the upper extremity. However, unable to visualize the ulnar veins.  *See table(s) above for measurements and observations.  Diagnosing physician: Orlie Pollen Electronically signed by Orlie Pollen on 03/19/2022 at 5:38:50 PM.    Final      LOS: 6 days   Oren Binet, MD  [facility]    To contact the attending provider between 7A-7P or the covering provider during after hours 7P-7A, please log into the web site [email or web address] and access using universal [place] password for that web site. If you do not have the password, please call the hospital operator.  03/21/2022, 1:47 PM

## 2022-03-21 NOTE — Progress Notes (Signed)
Speech Language Pathology Treatment: Dysphagia  Patient Details Name: Tara [name] MRN: [id number] DOB: 04-Jul-1946 Today's Date: 03/21/2022 Time: 4098-1191 SLP Time Calculation (min) (ACUTE ONLY): 12 min  Assessment / Plan / Recommendation Clinical Impression  Limited participation today. Tara [name]'s sisters were at bedside.  She was reluctant to eat/drink anything, politely declining then accepting only a few sips of thin liquid and applesauce. She maintained her eyes closed for the duration of the session and spoke only single words when asked questions.  Oral phase was a bit prolonged but functional. There was no residue remaining in oral cavity. There were no overt s/sx of aspiration.  Continue current diet. Will f/u early next week.   HPI HPI: Pt is a 76 y/o who presented 6/24 with new onset seizures. 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 EEG negative for seizures. Neurosurgey consulted and did not recommend meningioma resection as of 6/27. Decline in swallow function noted overnight 6/27. PMH: HTN, HLD, GERD, CKD stage IIIb, chronic HFpEF, meningioma, breast CA-s/p right lumpectomy and chemoradiation in 2008, depression/anxiety. BSE 03/11/22: regular texture diet and thin liquids without need for follow up.      SLP Plan  Continue with current plan of care      Recommendations for follow up therapy are one component of a multi-disciplinary discharge planning process, led by the attending physician.  Recommendations may be updated based on patient status, additional functional criteria and insurance authorization.    Recommendations  Diet recommendations: Dysphagia 2 (fine chop);Thin liquid Liquids provided via: Cup;Straw Medication Administration: Whole meds with puree Supervision: Staff to assist with self feeding Compensations: Minimize environmental distractions Postural Changes and/or Swallow Maneuvers: Seated upright 90 degrees                 Oral Care Recommendations: Oral care BID Follow Up Recommendations: Other (comment) (TBD) SLP Visit Diagnosis: Dysphagia, oropharyngeal phase (R13.12) Plan: Continue with current plan of care         Tara [name] L. Tivis Ringer, MA CCC/SLP Clinical Specialist - Acute Care SLP Acute Rehabilitation Services Office number [phone]   Tara [name]  03/21/2022, 3:58 PM

## 2022-03-21 NOTE — Progress Notes (Signed)
PT Cancellation Note  Patient Details Name: Tara [name] MRN: [id number] DOB: 06/27/1946   Cancelled Treatment:    Reason Eval/Treat Not Completed: Patient declined, no reason specified.  Stated only "I don't want to do therapy."  Retry at anther time.   Ramond Dial 03/21/2022, 2:44 PM  Mee Hives, PT PhD Acute Rehab Dept. Number: [place]

## 2022-03-22 ENCOUNTER — Other Ambulatory Visit: Payer: Self-pay | Admitting: Nurse Practitioner

## 2022-03-22 DIAGNOSIS — R569 Unspecified convulsions: Secondary | ICD-10-CM | POA: Diagnosis not present

## 2022-03-22 DIAGNOSIS — E440 Moderate protein-calorie malnutrition: Secondary | ICD-10-CM | POA: Diagnosis not present

## 2022-03-22 DIAGNOSIS — D320 Benign neoplasm of cerebral meninges: Secondary | ICD-10-CM | POA: Diagnosis not present

## 2022-03-22 DIAGNOSIS — F419 Anxiety disorder, unspecified: Secondary | ICD-10-CM | POA: Diagnosis not present

## 2022-03-22 LAB — CBC
HCT: 24.3 % — ABNORMAL LOW (ref 36.0–46.0)
Hemoglobin: 8.1 g/dL — ABNORMAL LOW (ref 12.0–15.0)
MCH: 28.4 pg (ref 26.0–34.0)
MCHC: 33.3 g/dL (ref 30.0–36.0)
MCV: 85.3 fL (ref 80.0–100.0)
Platelets: 135 10*3/uL — ABNORMAL LOW (ref 150–400)
RBC: 2.85 MIL/uL — ABNORMAL LOW (ref 3.87–5.11)
RDW: 15.6 % — ABNORMAL HIGH (ref 11.5–15.5)
WBC: 12.7 10*3/uL — ABNORMAL HIGH (ref 4.0–10.5)
nRBC: 0 % (ref 0.0–0.2)

## 2022-03-22 LAB — BASIC METABOLIC PANEL
Anion gap: 11 (ref 5–15)
BUN: 32 mg/dL — ABNORMAL HIGH (ref 8–23)
CO2: 23 mmol/L (ref 22–32)
Calcium: 8.3 mg/dL — ABNORMAL LOW (ref 8.9–10.3)
Chloride: 101 mmol/L (ref 98–111)
Creatinine, Ser: 1.05 mg/dL — ABNORMAL HIGH (ref 0.44–1.00)
GFR, Estimated: 55 mL/min — ABNORMAL LOW (ref >60)
Glucose, Bld: 153 mg/dL — ABNORMAL HIGH (ref 70–99)
Potassium: 4.2 mmol/L (ref 3.5–5.1)
Sodium: 135 mmol/L (ref 135–145)

## 2022-03-22 LAB — GLUCOSE, CAPILLARY
Glucose-Capillary: 159 mg/dL — ABNORMAL HIGH (ref 70–99)
Glucose-Capillary: 185 mg/dL — ABNORMAL HIGH (ref 70–99)
Glucose-Capillary: 143 mg/dL — ABNORMAL HIGH (ref 70–99)
Glucose-Capillary: 154 mg/dL — ABNORMAL HIGH (ref 70–99)

## 2022-03-22 LAB — APTT: aPTT: 88 seconds — ABNORMAL HIGH (ref 24–36)

## 2022-03-22 LAB — HEPARIN LEVEL (UNFRACTIONATED): Heparin Unfractionated: >1.1 IU/mL — ABNORMAL HIGH (ref 0.30–0.70)

## 2022-03-22 MED ORDER — PANTOPRAZOLE 2 MG/ML SUSPENSION
40.0000 mg | Freq: Every day | ORAL | Status: DC
  Administered 2022-03-22 – 2022-03-23 (×2): 40 mg via ORAL
  Filled 2022-03-22 (×2): qty 20

## 2022-03-22 NOTE — Progress Notes (Signed)
ANTICOAGULATION CONSULT NOTE - Follow Up Consult  Pharmacy Consult for Heparin Indication: atrial fibrillation  Allergies  Allergen Reactions   Aspirin Other (See Comments)    Reports GI bleed--pt is currently taking   Lisinopril Cough   Sudafed [Pseudoephedrine Hcl]     Pt reports she had drainage in throat that made her throat hurt.     Patient Measurements: Height: '4\' 9"'$  (144.8 cm) Weight: 85 kg (187 lb 6.3 oz) IBW/kg (Calculated) : 38.6 Heparin Dosing Weight: 59.3 kg  Vital Signs: Temp: 98 F (36.7 C) (07/01 0321) Temp Source: Axillary (07/01 0321) BP: 134/72 (07/01 0321) Pulse Rate: 79 (07/01 0321)  Labs: Recent Labs    03/20/22 0451 03/21/22 0232 03/22/22 0315  HGB 8.1*  --  8.1*  HCT 24.5*  --  24.3*  PLT 143*  --  135*  APTT 76* 63* 88*  HEPARINUNFRC 1.09* 1.02* >1.10*  CREATININE 1.24*  --  1.05*    Estimated Creatinine Clearance: 41.2 mL/min (A) (by C-G formula based on SCr of 1.05 mg/dL (H)).   Medications:  Scheduled:   amiodarone  200 mg Oral Daily   clonazePAM  0.5 mg Oral QHS   dexamethasone (DECADRON) injection  4 mg Intravenous Q6H   insulin aspart  0-5 Units Subcutaneous QHS   insulin aspart  0-9 Units Subcutaneous TID WC   lacosamide  100 mg Oral BID   levETIRAcetam  500 mg Oral BID   montelukast  10 mg Oral QHS   pantoprazole  40 mg Oral BID   rosuvastatin  20 mg Oral Daily   sertraline  25 mg Oral Daily   Infusions:   heparin 600 Units/hr (03/21/22 0748)    Assessment: 76 yo F presenting with new onset seizures. On apixaban PTA for pAF, last dose on 6/24 AM. Holding apixaban and transition to IV heparin while awaiting plans for surgery. Pharmacy consulted for heparin dosing.   aPTT 88 - therapeutic. Heparin level > 1.10. aPTT and heparin level still not correlating, continue to titrate heparin using only aPTTs. Hgb 8.1, plt 135. No line issues or signs/symptoms of bleeding noted per RN.   Per neurosurgery - plans noted for  craniotomy on 7/3.   Goal of Therapy:  Heparin level 0.3-0.7 units/ml aPTT 66-102 seconds Monitor platelets by anticoagulation protocol: Yes   Plan:  Continue IV heparin at 600 units/hr. Daily CBC, aPTT, heparin level. Monitor for signs/symptoms of bleeding.   Vance Peper, PharmD PGY-2 Pharmacy Resident Phone [phone] 03/22/2022 7:08 AM   Please check AMION for all [place] phone numbers After 10:00 PM, call [place] [phone]

## 2022-03-22 NOTE — Progress Notes (Signed)
PROGRESS NOTE        PATIENT DETAILS Name: Tara [name] Age: 76 y.o. Sex: female Date of Birth: 06-Feb-1946 Admit Date: 03/15/2022 Admitting Physician Rolla Plate, DO [id number], Norwood Levo, MD  Brief Summary: Patient is a 76 y.o.  female with a history of HTN, HLD, CKD stage IIIb, chronic HFpEF, meningioma, breast CA-s/p right lumpectomy and chemoradiation in 2008, depression/anxiety who was hospitalized from 6/8-6/22 for AKI/A-fib RVR with hypotension-presented to [facility] with new onset seizures.    Significant events: 6/08-6/22>> hospitalization for weakness-AKI-A-fib RVR with hypotension-meningioma-subsequently discharged home after stabilization.   6/24>> presented to [facility] ED with seizure-transferred to [facility].  Significant studies: 6/08>> CT head: Interval increase in size of previously noted planum sphenoidal meningioma. 6/08>> CT C-spine: No fracture or dislocation 6/09>> Echo: EF 55% 6/12>> TSH: 0.429 6/13>> CXR: No PNA 6/24>> CT head: No acute process-stable sphenoidal meningioma 2.4 x 3.3 x 2.8 cm-hypodense lesion is seen in the frontal lobe medially to the adjacent mass-representing edema/infarct. 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 6/28>> Keppra level: 37.1 (normal limits)  Significant microbiology data: 6/08>> influenza/COVID PCR: Negative 6/13>> blood culture: Negative  Procedures: None  Consults: Neurology Neurosurgery  Subjective: No major events overnight-spouse at bedside.  Keeps eyes closed (was doing it in her prior hospitalization as well-however when asked to open-she will open eyes-track my movement-follow all commands.  Speech is clear.  Per spouse appreciate a significant portion of her meals  yesterday.  Objective: Vitals: Blood pressure 129/85, pulse 75, temperature 98.3 F (36.8 C), temperature source Oral, resp. rate 19, height '4\' 9"'$  (1.448 m), weight 85 kg, SpO2 96 %.   Exam: Gen Exam:Alert awake-not in any distress HEENT:atraumatic, normocephalic Chest: B/L clear to auscultation anteriorly CVS:S1S2 regular Abdomen:soft non tender, non distended Extremities:no edema Neurology: Non focal Skin: no rash   Pertinent Labs/Radiology:    Latest Ref Rng & Units 03/22/2022    3:15 AM 03/20/2022    4:51 AM 03/17/2022    3:43 AM  CBC  WBC 4.0 - 10.5 K/uL 12.7  12.5  18.6   Hemoglobin 12.0 - 15.0 g/dL 8.1  8.1  7.9   Hematocrit 36.0 - 46.0 % 24.3  24.5  23.6   Platelets 150 - 400 K/uL 135  143  159     Lab Results  Component Value Date   NA 135 03/22/2022   K 4.2 03/22/2022   CL 101 03/22/2022   CO2 23 03/22/2022     Assessment/Plan: New onset seizure: No further seizures-on Keppra/Vimpat.  Due to concern for sedation/lethargy/difficulty ambulation-Keppra dose reduced to 500 mg p.o. twice daily on 6/28.  She is relatively awake and alert-and moving all 4 extremities.   Meningioma: Neurosurgery not convinced whether this longstanding meningioma is provoking seizures-she remains on IV Decadron.  Given intermittent encephalopathy/deconditioning-neurosurgery planning on craniotomy this coming Monday.  Acute toxic metabolic encephalopathy: Likely due to Keppra-much better after adjustment of dose on 6/28-see above  PAF: Maintaining sinus rhythm-continue amiodarone-Eliquis stopped on 6/24 in anticipation of craniotomy-currently on IV heparin.    Chronic HFpEF: Volume status stable  HTN: BP stable without use of any antihypertensives  HLD: Continue Crestor  CKD stage IIIb:  At baseline-watch closely.  Normocytic anemia: Due to CKD-hemoglobin stable-we will transfuse if significant drop.  Leukocytosis: No indication of infection-likely due to steroid  use.  Depression/anxiety: Continue Zoloft/Klonopin  GERD: Continue PPI  Prediabetes (A1c 6.3 on 6/8)-with steroid-induced hyperglycemia: Monitor CBGs on SSI.  Recent Labs    03/21/22 1706 03/21/22 2157 03/22/22 0606  GLUCAP 149* 186* 159*    Back pain: Likely musculoskeletal in etiology-MRI of the lumbosacral spine without any significant pathology.    Acute on chronic debility/deconditioning: Appears weak/debilitated-family refused SNF last admit and took her home-suspect she would most benefit from going to subacute rehab on discharge-spouse aware of recommendations.  Morbid Obesity: Estimated body mass index is 40.55 kg/m as calculated from the following:   Height as of this encounter: '4\' 9"'$  (1.448 m).   Weight as of this encounter: 85 kg.    Code status:   Code Status: Full Code   DVT Prophylaxis: SCDs Start: 03/15/22 0454 IV heparin    Family Communication: Spouse at bedside  Disposition Plan: Status is: Inpatient Remains inpatient appropriate because: New onset seizure-craniotomy on Monday-will require several more days of hospitalization.   Planned Discharge Destination:Skilled nursing facility   Diet: Diet Order             DIET DYS 2 Room service appropriate? Yes with Assist; Fluid consistency: Thin  Diet effective now                     Antimicrobial agents: Anti-infectives (From admission, onward)    None        MEDICATIONS: Scheduled Meds:  amiodarone  200 mg Oral Daily   clonazePAM  0.5 mg Oral QHS   dexamethasone (DECADRON) injection  4 mg Intravenous Q6H   insulin aspart  0-5 Units Subcutaneous QHS   insulin aspart  0-9 Units Subcutaneous TID WC   lacosamide  100 mg Oral BID   levETIRAcetam  500 mg Oral BID   montelukast  10 mg Oral QHS   pantoprazole  40 mg Oral BID   rosuvastatin  20 mg Oral Daily   sertraline  25 mg Oral Daily   Continuous Infusions:  heparin 600 Units/hr (03/21/22 0748)   PRN Meds:.acetaminophen  **OR** acetaminophen, antiseptic oral rinse, guaiFENesin, hydrALAZINE, metoprolol tartrate, morphine injection, ondansetron **OR** ondansetron (ZOFRAN) IV, oxyCODONE, senna-docusate   I have personally reviewed following labs and imaging studies  LABORATORY DATA: CBC: Recent Labs  Lab 03/16/22 0357 03/17/22 0343 03/20/22 0451 03/22/22 0315  WBC 13.5* 18.6* 12.5* 12.7*  NEUTROABS  --   --  11.5*  --   HGB 7.7* 7.9* 8.1* 8.1*  HCT 23.4* 23.6* 24.5* 24.3*  MCV 86.7 86.8 85.7 85.3  PLT 143* 159 143* 135*     Basic Metabolic Panel: Recent Labs  Lab 03/16/22 0357 03/18/22 0335 03/20/22 0451 03/22/22 0315  NA 132* 134* 133* 135  K 3.7 4.0 4.1 4.2  CL 104 103 105 101  CO2 21* '22 22 23  '$ GLUCOSE 135* 129* 128* 153*  BUN 24* 40* 32* 32*  CREATININE 1.11* 1.43* 1.24* 1.05*  CALCIUM 8.4* 8.9 8.3* 8.3*  MG 1.9  --   --   --      GFR: Estimated Creatinine Clearance: 41.2 mL/min (A) (by C-G formula based on SCr of 1.05 mg/dL (H)).  Liver Function Tests: Recent Labs  Lab 03/20/22 0451  AST 17  ALT 36  ALKPHOS 43  BILITOT 0.6  PROT 5.2*  ALBUMIN 2.6*  No results for input(s): "LIPASE", "AMYLASE" in the last 168 hours.  No results for input(s): "AMMONIA" in the last 168 hours.  Coagulation Profile: No results for input(s): "INR", "PROTIME" in the last 168 hours.   Cardiac Enzymes: No results for input(s): "CKTOTAL", "CKMB", "CKMBINDEX", "TROPONINI" in the last 168 hours.   BNP (last 3 results) No results for input(s): "PROBNP" in the last 8760 hours.  Lipid Profile: No results for input(s): "CHOL", "HDL", "LDLCALC", "TRIG", "CHOLHDL", "LDLDIRECT" in the last 72 hours.  Thyroid Function Tests: No results for input(s): "TSH", "T4TOTAL", "FREET4", "T3FREE", "THYROIDAB" in the last 72 hours.   Anemia Panel: No results for input(s): "VITAMINB12", "FOLATE", "FERRITIN", "TIBC", "IRON", "RETICCTPCT" in the last 72 hours.  Urine analysis:    Component Value  Date/Time   COLORURINE YELLOW 03/15/2022 0832   APPEARANCEUR CLEAR 03/15/2022 0832   LABSPEC 1.014 03/15/2022 0832   PHURINE 6.0 03/15/2022 0832   GLUCOSEU >=500 (A) 03/15/2022 0832   HGBUR SMALL (A) 03/15/2022 0832   BILIRUBINUR NEGATIVE 03/15/2022 0832   KETONESUR NEGATIVE 03/15/2022 0832   PROTEINUR 30 (A) 03/15/2022 0832   UROBILINOGEN 0.2 02/07/2013 1859   NITRITE NEGATIVE 03/15/2022 0832   LEUKOCYTESUR NEGATIVE 03/15/2022 0832    Sepsis Labs: Lactic Acid, Venous    Component Value Date/Time   LATICACIDVEN 1.7 03/03/2022 1340    MICROBIOLOGY: No results found for this or any previous visit (from the past 240 hour(s)).   RADIOLOGY STUDIES/RESULTS: No results found.   LOS: 7 days   Oren Binet, MD  [facility]    To contact the attending provider between 7A-7P or the covering provider during after hours 7P-7A, please log into the web site [email or web address] and access using universal [place] password for that web site. If you do not have the password, please call the hospital operator.  03/22/2022, 10:32 AM

## 2022-03-22 NOTE — Progress Notes (Signed)
Subjective: Patient reports doing well. She has no current complaints. No acute events overnight.   Objective: Vital signs in last 24 hours: Temp:  [97.7 F (36.5 C)-98.3 F (36.8 C)] 98 F (36.7 C) (07/01 1051) Pulse Rate:  [75-79] 75 (07/01 0721) Resp:  [15-20] 19 (07/01 0721) BP: (111-134)/(54-85) 111/54 (07/01 1051) SpO2:  [96 %-97 %] 96 % (07/01 0321)  Intake/Output from previous day: 06/30 0701 - 07/01 0700 In: 340 [P.O.:340] Out: 1250 [Urine:1250] Intake/Output this shift: Total I/O In: 240 [P.O.:240] Out: 700 [Urine:700]  Physical Exam: Patient was asleep but aroused easly to voice. She appears drowsy. O X 4. Eyes open spontaneously. They are in NAD and VSS. Follows commands. Speech is not fluent. MAEW with good strength. Sensation to light touch is intact. PERLA, EOMI. CNs grossly intact.    Lab Results: Recent Labs    03/20/22 0451 03/22/22 0315  WBC 12.5* 12.7*  HGB 8.1* 8.1*  HCT 24.5* 24.3*  PLT 143* 135*   BMET Recent Labs    03/20/22 0451 03/22/22 0315  NA 133* 135  K 4.1 4.2  CL 105 101  CO2 22 23  GLUCOSE 128* 153*  BUN 32* 32*  CREATININE 1.24* 1.05*  CALCIUM 8.3* 8.3*    Studies/Results: No results found.  Assessment/Plan: 76 y.o. 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 Plan for surgical resection on Monday with Dr. Christella Noa. Patient is currently stable without any reported seizure activity.     -Plan for OR Monday -NPO Monday at 0001    LOS: 7 days     Marvis Moeller, DNP, AGNP-C Neurosurgery Nurse Practitioner  [facility] [address], [place], [place] [address] P: [phone]    F: [phone]  03/22/2022 12:46 PM

## 2022-03-23 ENCOUNTER — Inpatient Hospital Stay (HOSPITAL_COMMUNITY): Payer: Medicare Other

## 2022-03-23 DIAGNOSIS — R569 Unspecified convulsions: Secondary | ICD-10-CM | POA: Diagnosis not present

## 2022-03-23 LAB — CBC
HCT: 25.3 % — ABNORMAL LOW (ref 36.0–46.0)
Hemoglobin: 8.1 g/dL — ABNORMAL LOW (ref 12.0–15.0)
MCH: 27.5 pg (ref 26.0–34.0)
MCHC: 32 g/dL (ref 30.0–36.0)
MCV: 85.8 fL (ref 80.0–100.0)
Platelets: 151 10*3/uL (ref 150–400)
RBC: 2.95 MIL/uL — ABNORMAL LOW (ref 3.87–5.11)
RDW: 15.2 % (ref 11.5–15.5)
WBC: 13.1 10*3/uL — ABNORMAL HIGH (ref 4.0–10.5)
nRBC: 0 % (ref 0.0–0.2)

## 2022-03-23 LAB — BASIC METABOLIC PANEL
Anion gap: 9 (ref 5–15)
BUN: 29 mg/dL — ABNORMAL HIGH (ref 8–23)
CO2: 23 mmol/L (ref 22–32)
Calcium: 8 mg/dL — ABNORMAL LOW (ref 8.9–10.3)
Chloride: 101 mmol/L (ref 98–111)
Creatinine, Ser: 1.1 mg/dL — ABNORMAL HIGH (ref 0.44–1.00)
GFR, Estimated: 52 mL/min — ABNORMAL LOW (ref >60)
Glucose, Bld: 175 mg/dL — ABNORMAL HIGH (ref 70–99)
Potassium: 3.8 mmol/L (ref 3.5–5.1)
Sodium: 133 mmol/L — ABNORMAL LOW (ref 135–145)

## 2022-03-23 LAB — GLUCOSE, CAPILLARY
Glucose-Capillary: 175 mg/dL — ABNORMAL HIGH (ref 70–99)
Glucose-Capillary: 225 mg/dL — ABNORMAL HIGH (ref 70–99)
Glucose-Capillary: 180 mg/dL — ABNORMAL HIGH (ref 70–99)
Glucose-Capillary: 190 mg/dL — ABNORMAL HIGH (ref 70–99)

## 2022-03-23 LAB — HEPARIN LEVEL (UNFRACTIONATED): Heparin Unfractionated: >1.1 IU/mL — ABNORMAL HIGH (ref 0.30–0.70)

## 2022-03-23 LAB — APTT: aPTT: 109 seconds — ABNORMAL HIGH (ref 24–36)

## 2022-03-23 MED ORDER — VALPROATE SODIUM 100 MG/ML IV SOLN
1700.0000 mg | Freq: Once | INTRAVENOUS | Status: AC
  Administered 2022-03-24: 1700 mg via INTRAVENOUS
  Filled 2022-03-23: qty 17

## 2022-03-23 MED ORDER — LACOSAMIDE 50 MG PO TABS
200.0000 mg | ORAL_TABLET | Freq: Two times a day (BID) | ORAL | Status: DC
  Administered 2022-03-23 – 2022-03-26 (×5): 200 mg via ORAL
  Filled 2022-03-23 (×4): qty 4
  Filled 2022-03-23: qty 1

## 2022-03-23 MED ORDER — SODIUM CHLORIDE 0.9 % IV SOLN
100.0000 mg | Freq: Once | INTRAVENOUS | Status: AC
  Administered 2022-03-23: 100 mg via INTRAVENOUS
  Filled 2022-03-23: qty 10

## 2022-03-23 MED ORDER — VALPROATE SODIUM 100 MG/ML IV SOLN
15.0000 mg/kg/d | Freq: Three times a day (TID) | INTRAVENOUS | Status: DC
  Administered 2022-03-24 – 2022-03-26 (×6): 425 mg via INTRAVENOUS
  Filled 2022-03-23 (×11): qty 4.25

## 2022-03-23 MED ORDER — LEVETIRACETAM IN NACL 500 MG/100ML IV SOLN
500.0000 mg | Freq: Two times a day (BID) | INTRAVENOUS | Status: DC
  Administered 2022-03-24 – 2022-03-26 (×5): 500 mg via INTRAVENOUS
  Filled 2022-03-23 (×5): qty 100

## 2022-03-23 NOTE — Progress Notes (Signed)
Subjective: Patient reports feeling slightly better this morning. Family concerns of "lip smacking" overnight as this preceded her initial seizures. No seizure activity was appreciated by the patient's RN.  Objective: Vital signs in last 24 hours: Temp:  [98 F (36.7 C)-99 F (37.2 C)] 99 F (37.2 C) (07/02 0307) Pulse Rate:  [67-81] 81 (07/02 0307) Resp:  [17-21] 19 (07/02 0307) BP: (111-124)/(54-66) 121/66 (07/02 0307) SpO2:  [95 %-97 %] 97 % (07/02 0307)  Intake/Output from previous day: 07/01 0701 - 07/02 0700 In: 646.5 [P.O.:240; I.V.:406.5] Out: 1400 [Urine:1400] Intake/Output this shift: No intake/output data recorded.  Physical Exam: Patient was asleep but aroused easly to voice. She is drowsy but slightly improved from yesterday. She is O X 4. Eyes open spontaneously. They are in NAD and VSS. Follows commands. Speech is not fluent. MAEW with good strength. Sensation to light touch is intact. PERLA, EOMI. CNs grossly intact.     Lab Results: Recent Labs    03/22/22 0315 03/23/22 0248  WBC 12.7* 13.1*  HGB 8.1* 8.1*  HCT 24.3* 25.3*  PLT 135* 151   BMET Recent Labs    03/22/22 0315 03/23/22 0248  NA 135 133*  K 4.2 3.8  CL 101 101  CO2 23 23  GLUCOSE 153* 175*  BUN 32* 29*  CREATININE 1.05* 1.10*  CALCIUM 8.3* 8.0*    Studies/Results: No results found.  Assessment/Plan: 76 y.o. 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 Plan for surgical resection on Monday with Dr. Christella Noa. Patient is currently stable. No reported seizure activity but family is concerned over her "lip smacking" as this occurred prior to her seizures.    -Neurology consult for seizure -Stop heparin at midnight -NPO at midnight -Plan for OR tomorrow with Dr. Christella Noa -Continue AEDs   LOS: 8 days     Marvis Moeller, DNP, AGNP-C Neurosurgery Nurse Practitioner  [facility] [place]. [address], [address], [place], [place]  [address] P: [phone]    F: [phone]  03/23/2022 7:51 AM

## 2022-03-23 NOTE — Progress Notes (Signed)
PROGRESS NOTE        PATIENT DETAILS Name: Tara [name] Age: 76 y.o. Sex: female Date of Birth: [birth year] Admit Date: 03/15/2022 Admitting Physician Rolla Plate, DO [id number], Norwood Levo, MD  Brief Summary: Patient is a 76 y.o.  female with a history of HTN, HLD, CKD stage IIIb, chronic HFpEF, meningioma, breast CA-s/p right lumpectomy and chemoradiation in 2008, depression/anxiety who was hospitalized from 6/8-6/22 for AKI/A-fib RVR with hypotension-presented to [facility] with new onset seizures.    Significant events: 6/08-6/22>> hospitalization for weakness-AKI-A-fib RVR with hypotension-meningioma-subsequently discharged home after stabilization.   6/24>> presented to [facility] with seizure-transferred to [facility].  Significant studies: 6/08>> CT head: Interval increase in size of previously noted planum sphenoidal meningioma. 6/08>> CT C-spine: No fracture or dislocation 6/09>> Echo: EF 55% 6/12>> TSH: 0.429 6/13>> CXR: No PNA 6/24>> CT head: No acute process-stable sphenoidal meningioma 2.4 x 3.3 x 2.8 cm-hypodense lesion is seen in the frontal lobe medially to the adjacent mass-representing edema/infarct. 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 6/28>> Keppra level: 37.1 (normal limits)  Significant microbiology data: 6/08>> influenza/COVID PCR: Negative 6/13>> blood culture: Negative  Procedures: None  Consults: Neurology Neurosurgery  Subjective:  Patient in bed, appears comfortable, denies any headache, no fever, no chest pain or pressure, no shortness of breath , no abdominal pain. No new focal weakness.   Objective: Vitals: Blood pressure 128/70, pulse 89, temperature 98.8 F (37.1 C), temperature source Oral, resp. rate 18, height 4'  9" (1.448 m), weight 85 kg, SpO2 96 %.   Exam:  Awake Alert, No new F.N deficits, Normal affect [place].AT,PERRAL Supple Neck, No JVD,   Symmetrical Chest wall movement, Good air movement bilaterally, CTAB RRR,No Gallops, Rubs or new Murmurs,  +ve B.Sounds, Abd Soft, No tenderness,   No Cyanosis, Clubbing or edema   Assessment/Plan:  New onset seizure: No further seizures-on Keppra/Vimpat.  Due to concern for sedation/lethargy/difficulty ambulation-Keppra dose reduced to 500 mg p.o. twice daily on 6/28.  She is relatively awake and alert-and moving all 4 extremities.   Meningioma: Neurosurgery not convinced whether this longstanding meningioma is provoking seizures-she remains on IV Decadron.  Given intermittent encephalopathy/deconditioning-neurosurgery planning on craniotomy this coming Monday.  I will hold heparin drip from 10 PM on 03/23/2022.  PAF: Maintaining sinus rhythm-continue amiodarone-Eliquis stopped on 6/24 in anticipation of craniotomy-currently on IV heparin.  Kindly see above.  Acute toxic metabolic encephalopathy: Likely due to Keppra-much better after adjustment of dose on 6/28-see above  Chronic HFpEF EF 55% on echocardiogram done last month: Volume status stable  HTN: BP stable without use of any antihypertensives  HLD: Continue Crestor  CKD stage IIIb: At baseline-watch closely.  Normocytic anemia: Due to CKD-hemoglobin stable-we will transfuse if significant drop.  Leukocytosis: No indication of infection-likely due to steroid use.  Depression/anxiety: Continue Zoloft/Klonopin  GERD: Continue PPI  Back pain: Likely musculoskeletal in etiology-MRI of the lumbosacral spine without any significant pathology.    Acute on chronic debility/deconditioning: Appears weak/debilitated-family refused SNF last admit and took her home-suspect she would most benefit from going to subacute rehab on discharge-spouse aware of recommendations.  Prediabetes (A1c 6.3 on 6/8)-with  steroid-induced hyperglycemia: Monitor CBGs  on SSI.  Recent Labs    03/22/22 1652 03/22/22 2138 03/23/22 0556  GLUCAP 143* 154* 175*    Morbid Obesity: Estimated body mass index is 40.55 kg/m as calculated from the following:   Height as of this encounter: '4\' 9"'$  (1.448 m).   Weight as of this encounter: 85 kg.    Code status:   Code Status: Full Code   DVT Prophylaxis: Place and maintain sequential compression device Start: 03/23/22 0631 SCDs Start: 03/15/22 0454 IV heparin    Family Communication: Spouse at bedside 03/23/22  Disposition Plan: Status is: Inpatient Remains inpatient appropriate because: New onset seizure-craniotomy on Monday-will require several more days of hospitalization.   Planned Discharge Destination:Skilled nursing facility   Diet: Diet Order             Diet NPO time specified  Diet effective midnight           DIET DYS 2 Room service appropriate? Yes with Assist; Fluid consistency: Thin  Diet effective now                     Antimicrobial agents: Anti-infectives (From admission, onward)    None        MEDICATIONS: Scheduled Meds:  amiodarone  200 mg Oral Daily   clonazePAM  0.5 mg Oral QHS   dexamethasone (DECADRON) injection  4 mg Intravenous Q6H   insulin aspart  0-5 Units Subcutaneous QHS   insulin aspart  0-9 Units Subcutaneous TID WC   lacosamide  100 mg Oral BID   levETIRAcetam  500 mg Oral BID   montelukast  10 mg Oral QHS   pantoprazole sodium  40 mg Oral Daily   rosuvastatin  20 mg Oral Daily   sertraline  25 mg Oral Daily   Continuous Infusions:  heparin 550 Units/hr (03/23/22 0931)   PRN Meds:.acetaminophen **OR** acetaminophen, antiseptic oral rinse, guaiFENesin, hydrALAZINE, metoprolol tartrate, morphine injection, ondansetron **OR** ondansetron (ZOFRAN) IV, oxyCODONE, senna-docusate   I have personally reviewed following labs and imaging studies  LABORATORY DATA:  Recent Labs  Lab 03/17/22 0343  03/20/22 0451 03/22/22 0315 03/23/22 0248  WBC 18.6* 12.5* 12.7* 13.1*  HGB 7.9* 8.1* 8.1* 8.1*  HCT 23.6* 24.5* 24.3* 25.3*  PLT 159 143* 135* 151  MCV 86.8 85.7 85.3 85.8  MCH 29.0 28.3 28.4 27.5  MCHC 33.5 33.1 33.3 32.0  RDW 15.9* 15.7* 15.6* 15.2  LYMPHSABS  --  0.4*  --   --   MONOABS  --  0.5  --   --   EOSABS  --  0.0  --   --   BASOSABS  --  0.0  --   --     Recent Labs  Lab 03/18/22 0335 03/20/22 0451 03/22/22 0315 03/23/22 0248  NA 134* 133* 135 133*  K 4.0 4.1 4.2 3.8  CL 103 105 101 101  CO2 '22 22 23 23  '$ GLUCOSE 129* 128* 153* 175*  BUN 40* 32* 32* 29*  CREATININE 1.43* 1.24* 1.05* 1.10*  CALCIUM 8.9 8.3* 8.3* 8.0*  AST  --  17  --   --   ALT  --  36  --   --   ALKPHOS  --  43  --   --   BILITOT  --  0.6  --   --   ALBUMIN  --  2.6*  --   --            LOS: 8 days  Signature  Lala Lund M.D on 03/23/2022 at 10:01 AM   -  To page go to [email or web address]

## 2022-03-23 NOTE — Progress Notes (Signed)
EEG complete - results pending 

## 2022-03-23 NOTE — Progress Notes (Signed)
Pts. Family at bedside reported to this RN that patient has been smacking her lips and that this has preceded a seizure previously. Dr. Myna Hidalgo paged.

## 2022-03-23 NOTE — Procedures (Signed)
Patient Name: Tara [name]  MRN: [id number]  Epilepsy Attending: Lora Havens  Referring Physician/Provider: August Albino, NP  Date: 03/23/2022 Duration: 23.27 mins  Patient history: 76 y.o. female with past medical history of anxiety, depression, GERD, hypertension, hyperlipidemia, nonischemic cardiomyopathy, atrial fibrillation on eliquis, Systolic HF, hx of breast cancer, meningioma who presented to the hospital on 03/15/2022 for evaluation of new onset seizures.  Level of alertness: Awake  AEDs during EEG study: LEV, LCM, Clonazepam  Technical aspects: This EEG study was done with scalp electrodes positioned according to the 10-20 International system of electrode placement. Electrical activity was acquired at a sampling rate of '500Hz'$  and reviewed with a high frequency filter of '70Hz'$  and a low frequency filter of '1Hz'$ . EEG data were recorded continuously and digitally stored.   Description: The posterior dominant rhythm consists of 7 Hz activity of moderate voltage (25-35 uV) seen predominantly in posterior head regions, symmetric and reactive to eye opening and eye closing. EEG showed continuous low amplitude 2-'3hz'$  delta slowing in right fronto-temporal region. Hyperventilation and photic stimulation were not performed.     Of note, study was technically difficult due to significant chewing artifact.  ABNORMALITY - Continuous slow, right fronto-temporal region - Background slow  IMPRESSION: This technically difficult study is suggestive of cortical dysfunction arising from right fronto-temporal region likely secondary to underlying structural abnormality.Additionally there is mild to moderate diffuse encephalopathy, nonspecific etiology. No seizures or epileptiform discharges were seen throughout the recording.  Halee Glynn Barbra Sarks

## 2022-03-23 NOTE — Progress Notes (Signed)
Spoke with Dr. Glenford Peers, who says he will be coming by to see patient shortly.

## 2022-03-23 NOTE — Progress Notes (Signed)
Per Dr. Myna Hidalgo, Dr. Marlyce Huge is now covering the floor. Dr. Marlyce Huge paged to notify about family's concerns regarding patient's lip smacking and how it preceded a seizure previously.

## 2022-03-23 NOTE — Progress Notes (Addendum)
ANTICOAGULATION CONSULT NOTE - Follow Up Consult  Pharmacy Consult for Heparin Indication: atrial fibrillation  Allergies  Allergen Reactions   Aspirin Other (See Comments)    Reports GI bleed--pt is currently taking   Lisinopril Cough   Sudafed [Pseudoephedrine Hcl]     Pt reports she had drainage in throat that made her throat hurt.     Patient Measurements: Height: '4\' 9"'$  (144.8 cm) Weight: 85 kg (187 lb 6.3 oz) IBW/kg (Calculated) : 38.6 Heparin Dosing Weight: 59.3 kg  Vital Signs: Temp: 99 F (37.2 C) (07/02 0307) Temp Source: Oral (07/02 0307) BP: 121/66 (07/02 0307) Pulse Rate: 81 (07/02 0307)  Labs: Recent Labs    03/21/22 0232 03/22/22 0315 03/23/22 0248  HGB  --  8.1* 8.1*  HCT  --  24.3* 25.3*  PLT  --  135* 151  APTT 63* 88* 109*  HEPARINUNFRC 1.02* >1.10* >1.10*  CREATININE  --  1.05* 1.10*    Estimated Creatinine Clearance: 39.3 mL/min (A) (by C-G formula based on SCr of 1.1 mg/dL (H)).   Medications:  Scheduled:   amiodarone  200 mg Oral Daily   clonazePAM  0.5 mg Oral QHS   dexamethasone (DECADRON) injection  4 mg Intravenous Q6H   insulin aspart  0-5 Units Subcutaneous QHS   insulin aspart  0-9 Units Subcutaneous TID WC   lacosamide  100 mg Oral BID   levETIRAcetam  500 mg Oral BID   montelukast  10 mg Oral QHS   pantoprazole sodium  40 mg Oral Daily   rosuvastatin  20 mg Oral Daily   sertraline  25 mg Oral Daily   Infusions:   heparin 600 Units/hr (03/22/22 2344)    Assessment: 76 yo F presenting with new onset seizures. On apixaban PTA for pAF, last dose on 6/24 AM. Holding apixaban and transition to IV heparin while awaiting plans for surgery. Pharmacy consulted for heparin dosing.   aPTT 109 - supratherapeutic. Heparin level > 1.10. aPTT and heparin level still not correlating, continue to titrate heparin using only aPTTs. Hgb 8.1, plt 151. No line issues or signs/symptoms of bleeding noted per RN.   Per neurosurgery - plans noted  for craniotomy on 7/3. IV heparin to stop on 7/3 at 0001 per NP Glenford Peers) for surgery tomorrow.   Goal of Therapy:  Heparin level 0.3-0.7 units/ml aPTT 66-102 seconds Monitor platelets by anticoagulation protocol: Yes   Plan:  Decrease IV heparin to 550 units/hr. Daily CBC, aPTT, heparin level. Monitor for signs/symptoms of bleeding.   Vance Peper, PharmD PGY-2 Pharmacy Resident Phone [phone] 03/23/2022 7:16 AM   Please check AMION for all [place] phone numbers After 10:00 PM, call [place] [phone]

## 2022-03-23 NOTE — Progress Notes (Signed)
LTM EEG hooked up and running - no initial skin breakdown - push button tested - neuro notified. [facility] monitoring.  

## 2022-03-23 NOTE — Consult Note (Addendum)
Neurology Progress Note  Reason for Consult: Increased seizure activity- lip smacking  Referring Physician: Weston Brass, NP   CC: Seizures   History is obtained from:medical record   HPI: Tara [name] is a 76 y.o. female with past medical history of anxiety, depression, GERD, hypertension, hyperlipidemia, nonischemic cardiomyopathy, atrial fibrillation on Eliquis, Systolic HF, hx of breast cancer, meningioma who presented to the hospital on 03/15/2022 for evaluation of new onset seizures. On the day of presentation she was lip smacking all day and then later on in the day she was found to be having a tonic clonic seizure with whole body stiffening and unresponsiveness. She was seen by neurology on 6/24 and loaded with 4.5 g Keppra followed by Keppra 750 mg (renally dosed) BID and a Vimpat load of 200 mg followed by '100mg'$  BID. Keppra was decreased to '500mg'$  BID on 6/28 due to increasing lethargy. Neurology was re-consulted for concerns of increased seizure activity with lip smacking during the night into this morning. Patient is scheduled to under go surgical resection of meningoma tomorrow.   ROS: Full ROS was performed and is negative except as noted in the HPI.    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   Anxiety    Breast cancer ([place]) 2008   right - s/p lumpectomy->chemo, radiation   Depression    Dysrhythmia    hx SVT   Family history of colon cancer    Family history of prostate cancer    GERD (gastroesophageal reflux disease)    H/O: hysterectomy    Hiatal hernia    History of cancer chemotherapy    History of radiation therapy    Hyperlipidemia    Hypertension 01/25/2018   Nonischemic cardiomyopathy ([place])    Personal history of radiation therapy 01/05/209   SVT (supraventricular tachycardia) ([facility])    short RP SVT documented 5/97   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[place])    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)   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     Family History  Problem Relation Age of Onset   Colon cancer Father 3       died at age 28   Hypertension Sister    Heart disease Sister    Cancer Sister        unknown form   Dementia Mother    Stroke Mother 58       left hemiparesis   Cancer Brother 33       prostate   Cancer Paternal Aunt        NOS   Lung cancer Paternal Uncle    Other Paternal Uncle        lightning strike   Other Paternal Uncle        hit by train   Other Paternal 37        old age   Cancer Cousin        NOS pat first cousin   Cancer Cousin        NOS pat first cousin   Prostate cancer Cousin        pat first cousin     Social History:   reports that she has never smoked. She has never used smokeless tobacco. She reports that she does not drink alcohol and does not use drugs.  Medications  Current  Facility-Administered Medications:    acetaminophen (TYLENOL) tablet 650 mg, 650 mg, Oral, Q6H PRN, 650 mg at 03/20/22 2316 **OR** acetaminophen (TYLENOL) suppository 650 mg, 650 mg, Rectal, Q6H PRN, Zierle-Ghosh, Asia B, DO   amiodarone (PACERONE) tablet 200 mg, 200 mg, Oral, Daily, Zierle-Ghosh, Asia B, DO, 200 mg at 03/23/22 0931   antiseptic oral rinse (BIOTENE) solution 15 mL, 15 mL, Mouth Rinse, PRN, Ashok Pall, MD   clonazePAM (KLONOPIN) tablet 0.5 mg, 0.5 mg, Oral, QHS, Zierle-Ghosh, Asia B, DO, 0.5 mg at 03/22/22 2151   dexamethasone (DECADRON) injection 4 mg, 4 mg, Intravenous, Q6H, Zierle-Ghosh, Asia B, DO, 4 mg at 03/23/22 0539   guaiFENesin (ROBITUSSIN) 100 MG/5ML liquid 5 mL, 5 mL, Oral, Q4H PRN, Amin, Ankit Chirag, MD   heparin ADULT infusion 100 units/mL (25000 units/26m), 550 Units/hr, Intravenous, Continuous, SThurnell Lose MD, Last Rate: 5.5 mL/hr at 03/23/22 0931, 550 Units/hr at 03/23/22 0931   hydrALAZINE (APRESOLINE) injection 10 mg, 10 mg, Intravenous, Q4H  PRN, Amin, Ankit Chirag, MD   insulin aspart (novoLOG) injection 0-5 Units, 0-5 Units, Subcutaneous, QHS, Zierle-Ghosh, Asia B, DO, 2 Units at 03/20/22 2200   insulin aspart (novoLOG) injection 0-9 Units, 0-9 Units, Subcutaneous, TID WC, Zierle-Ghosh, Asia B, DO, 2 Units at 03/23/22 08469  lacosamide (VIMPAT) tablet 100 mg, 100 mg, Oral, BID, SDerek Jack MD, 100 mg at 03/23/22 0931   levETIRAcetam (KEPPRA) tablet 500 mg, 500 mg, Oral, BID, Ghimire, SHenreitta Leber MD, 500 mg at 03/23/22 0931   metoprolol tartrate (LOPRESSOR) injection 5 mg, 5 mg, Intravenous, Q4H PRN, Amin, Ankit Chirag, MD   montelukast (SINGULAIR) tablet 10 mg, 10 mg, Oral, QHS, Zierle-Ghosh, Asia B, DO, 10 mg at 03/22/22 2151   morphine (PF) 2 MG/ML injection 2 mg, 2 mg, Intravenous, Q2H PRN, Zierle-Ghosh, Asia B, DO   ondansetron (ZOFRAN) tablet 4 mg, 4 mg, Oral, Q6H PRN **OR** ondansetron (ZOFRAN) injection 4 mg, 4 mg, Intravenous, Q6H PRN, Zierle-Ghosh, Asia B, DO   oxyCODONE (Oxy IR/ROXICODONE) immediate release tablet 5 mg, 5 mg, Oral, Q4H PRN, Zierle-Ghosh, Asia B, DO, 5 mg at 03/18/22 1938   pantoprazole sodium (PROTONIX) 40 mg/20 mL oral suspension 40 mg, 40 mg, Oral, Daily, Opyd, TIlene Qua MD, 40 mg at 03/23/22 0931   rosuvastatin (CRESTOR) tablet 20 mg, 20 mg, Oral, Daily, Zierle-Ghosh, Asia B, DO, 20 mg at 03/23/22 0931   senna-docusate (Senokot-S) tablet 1 tablet, 1 tablet, Oral, QHS PRN, ADamita Lack MD, 1 tablet at 03/17/22 2254   sertraline (ZOLOFT) tablet 25 mg, 25 mg, Oral, Daily, Zierle-Ghosh, Asia B, DO, 25 mg at 03/23/22 0931   Exam: Current vital signs: BP 128/70 (BP Location: Left Wrist)   Pulse 89   Temp 98.8 F (37.1 C) (Oral)   Resp 18   Ht '4\' 9"'$  (1.448 m)   Wt 85 kg   SpO2 96%   BMI 40.55 kg/m  Vital signs in last 24 hours: Temp:  [98.4 F (36.9 C)-99 F (37.2 C)] 98.8 F (37.1 C) (07/02 0929) Pulse Rate:  [67-89] 89 (07/02 0929) Resp:  [17-21] 18 (07/02 0929) BP:  (111-128)/(60-70) 128/70 (07/02 0929) SpO2:  [95 %-97 %] 96 % (07/02 0929)  GENERAL: lethargic, awakens easily in NAD HEENT: - Normocephalic and atraumatic, dry mm LUNGS - Clear to auscultation bilaterally with no wheezes CV - S1S2 RRR, no m/r/g, equal pulses bilaterally. ABDOMEN - Soft, nontender, nondistended with normoactive BS Ext: warm, well perfused, intact peripheral pulses, mild edema in  bilateral hands   NEURO:  Mental Status: She is lethargic with eyes closed, responsive to voice. Will open eyes when asked but otherwise keeps eyes closed. She is alert and oriented x 4. Follows commands generalized weakness, moves all extremities  Language: speech is clear but delayed.  Naming, repetition, fluency, and comprehension intact. Cranial Nerves: PERRL, EOMI, visual fields full, no facial asymmetry, facial sensation intact, hearing intact, tongue/uvula/soft palate midline, normal sternocleidomastoid and trapezius muscle strength. No evidence of tongue atrophy or fibrillations Motor: moves all extremities antigravity and equal, generalized weakness.  Tone: is mildly decreased and bulk is normal Sensation- Intact to light touch bilaterally Coordination: FTN intact bilaterally, no ataxia in BLE. Gait- Deferred  Imaging I have reviewed the images obtained:  CT-head 6/24: 1. No acute intracranial hemorrhage or other acute process. 2. Stable planum sphenoidale meningioma measuring 2.4 x 3.3 x 2.8 cm. Hypodense region is seen in the frontal lobe on the left medially adjacent to the mass, possibly representing edema or old infarct and unchanged from the prior exam.  MRI examination of the brain 6/24: - Long-standing planum sphenoidale meningioma with slow growth. Current dimensions are 3 x 3 x 2 cm. Possibly related to the history of seizure, there is new vasogenic edema in the adjacent left frontal lobe. - Small chronic cerebellar infarcts.  Assessment: 76 y.o. female with past medical  history of anxiety, depression, GERD, hypertension, hyperlipidemia, nonischemic cardiomyopathy, atrial fibrillation on eliquis, Systolic HF, hx of breast cancer, meningioma who presented to the hospital on 03/15/2022 for evaluation of new onset seizures. On the day of presentation she was lip smacking all day and then later on in the day she was found to be having tonic clonic seizure with whole body stiffening and unresponsive. She was seen by neurology on 6/24 loaded with 4.5 g Keppra followed by Keppra 750g (renally dosed) BID and a Vimpat load of '200mg'$  followed by '100mg'$  BID. Keppra was decreased to '500mg'$  BID due to increasing lethargy. Neurology was re-consulted for concerns of recurrent seizure activity with lip smacking during the night into this morning.  - On exam today she has eyes closed, lethargic, responds to voice, opens eyes spontaneously when asked. She tracks, PERRL, no visual field cut, no facial droop, generalized weakness of all 4 extremities. Did have intermittent lip smacking during exam. Subtle rhythmic right lateral jerking of her jaw was also noted. Husband at bedside and states she did lip smacking periodically during the night, unsure of duration, no limb jerking endorsed by family, with no other twitching or jerking noted on neurology exam.  - RN noted lip smacking as well this am during her assessment.   Impression: New onset seizures in the setting of known meningoma  Recommendations: - Increased Vimpat to 200 mg PO BID and give '100mg'$  IV x 1 now  - Continue Keppra @ 500 mg BID  - Continue clonazepam 0.5 mg HS - Spot EEG - Neurology will continue to follow   Beulah Gandy DNP, ACNPC-AG   Addendum: - Spot EEG report: Continuous slowing, right fronto-temporal region; background slowing. This technically difficult study is suggestive of cortical dysfunction arising from right fronto-temporal region likely secondary to underlying structural abnormality.Additionally there is  mild to moderate diffuse encephalopathy, nonspecific etiology. No seizures or epileptiform discharges were seen throughout the recording. - LTM was started and follow up exam by Neurology attending reveals continued subtle right lateral jaw jerking  - LTM EEG viewed at the bedside. Prominent muscle artifact occurring at  1 Hx intervals correlates with the right lateral subtle jaw jerking seen during the bedside examination. 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  - Has had sedated appearance per family that was attributed to [place]. Therefore will hold off on increasing Keppra dosage but will change to IV as patient may not be able to take PO due to AMS, and IV more consistent form of dosing than PO which can potentially be affected by malabsorption.  - Loading with VPA 20 mg/kg followed by scheduled IV dosing at 5 mg/kg TID - VPA level Monday AM (ordered).   Electronically signed: Dr. Kerney Elbe

## 2022-03-23 NOTE — Progress Notes (Signed)
Message left with Neurosurgery answering service regarding Pts. Lip smacking, and family's concern for possible impending seizure.

## 2022-03-24 ENCOUNTER — Encounter (HOSPITAL_COMMUNITY)
Admission: EM | Disposition: A | Discharge status: Rehabilitation Facility | Payer: Self-pay | Source: Home / Self Care | Attending: Neurosurgery

## 2022-03-24 ENCOUNTER — Encounter (HOSPITAL_COMMUNITY): Payer: Self-pay | Admitting: Family Medicine

## 2022-03-24 ENCOUNTER — Inpatient Hospital Stay (HOSPITAL_COMMUNITY): Payer: Medicare Other

## 2022-03-24 DIAGNOSIS — I110 Hypertensive heart disease with heart failure: Secondary | ICD-10-CM

## 2022-03-24 DIAGNOSIS — R569 Unspecified convulsions: Secondary | ICD-10-CM | POA: Diagnosis not present

## 2022-03-24 DIAGNOSIS — I10 Essential (primary) hypertension: Secondary | ICD-10-CM

## 2022-03-24 DIAGNOSIS — F418 Other specified anxiety disorders: Secondary | ICD-10-CM

## 2022-03-24 DIAGNOSIS — D320 Benign neoplasm of cerebral meninges: Secondary | ICD-10-CM

## 2022-03-24 DIAGNOSIS — D329 Benign neoplasm of meninges, unspecified: Secondary | ICD-10-CM | POA: Diagnosis present

## 2022-03-24 DIAGNOSIS — I509 Heart failure, unspecified: Secondary | ICD-10-CM

## 2022-03-24 HISTORY — PX: CRANIOTOMY: SHX93

## 2022-03-24 LAB — POCT I-STAT 7, (LYTES, BLD GAS, ICA,H+H)
Acid-base deficit: 2 mmol/L (ref 0.0–2.0)
Acid-base deficit: 3 mmol/L — ABNORMAL HIGH (ref 0.0–2.0)
Bicarbonate: 23.8 mmol/L (ref 20.0–28.0)
Bicarbonate: 23.4 mmol/L (ref 20.0–28.0)
Calcium, Ion: 1.11 mmol/L — ABNORMAL LOW (ref 1.15–1.40)
Calcium, Ion: 1.01 mmol/L — ABNORMAL LOW (ref 1.15–1.40)
HCT: 20 % — ABNORMAL LOW (ref 36.0–46.0)
HCT: 29 % — ABNORMAL LOW (ref 36.0–46.0)
Hemoglobin: 6.8 g/dL — CL (ref 12.0–15.0)
Hemoglobin: 9.9 g/dL — ABNORMAL LOW (ref 12.0–15.0)
O2 Saturation: 100 %
O2 Saturation: 100 %
Patient temperature: 34.9
Patient temperature: 34.8
Potassium: 3.9 mmol/L (ref 3.5–5.1)
Potassium: 4.5 mmol/L (ref 3.5–5.1)
Sodium: 130 mmol/L — ABNORMAL LOW (ref 135–145)
Sodium: 134 mmol/L — ABNORMAL LOW (ref 135–145)
TCO2: 25 mmol/L (ref 22–32)
TCO2: 25 mmol/L (ref 22–32)
pCO2 arterial: 39.2 mmHg (ref 32–48)
pCO2 arterial: 40.6 mmHg (ref 32–48)
pH, Arterial: 7.381 (ref 7.35–7.45)
pH, Arterial: 7.359 (ref 7.35–7.45)
pO2, Arterial: 213 mmHg — ABNORMAL HIGH (ref 83–108)
pO2, Arterial: 236 mmHg — ABNORMAL HIGH (ref 83–108)

## 2022-03-24 LAB — GLUCOSE, CAPILLARY
Glucose-Capillary: 183 mg/dL — ABNORMAL HIGH (ref 70–99)
Glucose-Capillary: 162 mg/dL — ABNORMAL HIGH (ref 70–99)
Glucose-Capillary: 158 mg/dL — ABNORMAL HIGH (ref 70–99)
Glucose-Capillary: 160 mg/dL — ABNORMAL HIGH (ref 70–99)
Glucose-Capillary: 179 mg/dL — ABNORMAL HIGH (ref 70–99)

## 2022-03-24 LAB — PREPARE RBC (CROSSMATCH)

## 2022-03-24 LAB — CBC
HCT: 26.5 % — ABNORMAL LOW (ref 36.0–46.0)
Hemoglobin: 8.5 g/dL — ABNORMAL LOW (ref 12.0–15.0)
MCH: 28 pg (ref 26.0–34.0)
MCHC: 32.1 g/dL (ref 30.0–36.0)
MCV: 87.2 fL (ref 80.0–100.0)
Platelets: 146 10*3/uL — ABNORMAL LOW (ref 150–400)
RBC: 3.04 MIL/uL — ABNORMAL LOW (ref 3.87–5.11)
RDW: 15.4 % (ref 11.5–15.5)
WBC: 10.9 10*3/uL — ABNORMAL HIGH (ref 4.0–10.5)
nRBC: 0 % (ref 0.0–0.2)

## 2022-03-24 LAB — BRAIN NATRIURETIC PEPTIDE: B Natriuretic Peptide: 41.5 pg/mL (ref 0.0–100.0)

## 2022-03-24 LAB — BASIC METABOLIC PANEL
Anion gap: 14 (ref 5–15)
BUN: 32 mg/dL — ABNORMAL HIGH (ref 8–23)
CO2: 22 mmol/L (ref 22–32)
Calcium: 8.3 mg/dL — ABNORMAL LOW (ref 8.9–10.3)
Chloride: 98 mmol/L (ref 98–111)
Creatinine, Ser: 1.24 mg/dL — ABNORMAL HIGH (ref 0.44–1.00)
GFR, Estimated: 45 mL/min — ABNORMAL LOW (ref >60)
Glucose, Bld: 199 mg/dL — ABNORMAL HIGH (ref 70–99)
Potassium: 4.1 mmol/L (ref 3.5–5.1)
Sodium: 134 mmol/L — ABNORMAL LOW (ref 135–145)

## 2022-03-24 LAB — VALPROIC ACID LEVEL: Valproic Acid Lvl: 66 ug/mL (ref 50.0–100.0)

## 2022-03-24 LAB — MAGNESIUM: Magnesium: 2.3 mg/dL (ref 1.7–2.4)

## 2022-03-24 SURGERY — CRANIOTOMY TUMOR EXCISION
Anesthesia: General | Site: Head | Laterality: Bilateral

## 2022-03-24 MED ORDER — LIDOCAINE 2% (20 MG/ML) 5 ML SYRINGE
INTRAMUSCULAR | Status: AC
  Filled 2022-03-24: qty 5

## 2022-03-24 MED ORDER — SUGAMMADEX SODIUM 200 MG/2ML IV SOLN
INTRAVENOUS | Status: DC | PRN
  Administered 2022-03-24: 100 mg via INTRAVENOUS
  Administered 2022-03-24: 200 mg via INTRAVENOUS

## 2022-03-24 MED ORDER — INSULIN ASPART 100 UNIT/ML IJ SOLN: 0.0000 [IU] | INTRAMUSCULAR | Status: DC | PRN

## 2022-03-24 MED ORDER — ROCURONIUM BROMIDE 10 MG/ML (PF) SYRINGE
PREFILLED_SYRINGE | INTRAVENOUS | Status: AC
  Filled 2022-03-24: qty 10

## 2022-03-24 MED ORDER — HYDROCODONE-ACETAMINOPHEN 5-325 MG PO TABS: 1.0000 | ORAL_TABLET | ORAL | Status: DC | PRN

## 2022-03-24 MED ORDER — CEFAZOLIN SODIUM-DEXTROSE 2-4 GM/100ML-% IV SOLN
2.0000 g | INTRAVENOUS | Status: AC
  Administered 2022-03-24: 2 g via INTRAVENOUS
  Filled 2022-03-24: qty 100

## 2022-03-24 MED ORDER — HEPARIN SODIUM (PORCINE) 5000 UNIT/ML IJ SOLN
5000.0000 [IU] | Freq: Three times a day (TID) | INTRAMUSCULAR | Status: DC
  Administered 2022-03-24 – 2022-03-26 (×5): 5000 [IU] via SUBCUTANEOUS
  Filled 2022-03-24 (×5): qty 1

## 2022-03-24 MED ORDER — DEXAMETHASONE SODIUM PHOSPHATE 10 MG/ML IJ SOLN
INTRAMUSCULAR | Status: DC | PRN
  Administered 2022-03-24: 10 mg via INTRAVENOUS

## 2022-03-24 MED ORDER — FENTANYL CITRATE (PF) 250 MCG/5ML IJ SOLN
INTRAMUSCULAR | Status: DC | PRN
Start: 2022-03-24 — End: 2022-03-24
  Administered 2022-03-24: 50 ug via INTRAVENOUS
  Administered 2022-03-24: 25 ug via INTRAVENOUS

## 2022-03-24 MED ORDER — BACITRACIN ZINC 500 UNIT/GM EX OINT
TOPICAL_OINTMENT | CUTANEOUS | Status: DC | PRN
  Administered 2022-03-24: 1 via TOPICAL

## 2022-03-24 MED ORDER — CHLORHEXIDINE GLUCONATE 0.12 % MT SOLN
15.0000 mL | Freq: Once | OROMUCOSAL | Status: AC
Start: 2022-03-24 — End: 2022-03-24

## 2022-03-24 MED ORDER — SODIUM CHLORIDE 0.9 % IV SOLN
0.1500 ug/kg/min | INTRAVENOUS | Status: DC
  Administered 2022-03-24: .2 ug/kg/min via INTRAVENOUS
  Filled 2022-03-24 (×4): qty 5000

## 2022-03-24 MED ORDER — LABETALOL HCL 5 MG/ML IV SOLN
10.0000 mg | INTRAVENOUS | Status: DC | PRN
  Administered 2022-03-26: 10 mg via INTRAVENOUS
  Administered 2022-03-26: 20 mg via INTRAVENOUS
  Administered 2022-03-28 (×2): 10 mg via INTRAVENOUS
  Filled 2022-03-24 (×3): qty 4

## 2022-03-24 MED ORDER — MANNITOL 25 % IV SOLN
INTRAVENOUS | Status: DC | PRN
  Administered 2022-03-24: 37.5 g via INTRAVENOUS

## 2022-03-24 MED ORDER — LACTATED RINGERS IV SOLN: INTRAVENOUS | Status: DC

## 2022-03-24 MED ORDER — ONDANSETRON HCL 4 MG/2ML IJ SOLN
INTRAMUSCULAR | Status: DC | PRN
  Administered 2022-03-24: 4 mg via INTRAVENOUS

## 2022-03-24 MED ORDER — NALOXONE HCL 0.4 MG/ML IJ SOLN: 0.0800 mg | INTRAMUSCULAR | Status: DC | PRN

## 2022-03-24 MED ORDER — FENTANYL CITRATE (PF) 250 MCG/5ML IJ SOLN
INTRAMUSCULAR | Status: AC
  Filled 2022-03-24: qty 5

## 2022-03-24 MED ORDER — CHLORHEXIDINE GLUCONATE CLOTH 2 % EX PADS: 6.0000 | MEDICATED_PAD | Freq: Once | CUTANEOUS | Status: DC

## 2022-03-24 MED ORDER — CHLORHEXIDINE GLUCONATE CLOTH 2 % EX PADS
6.0000 | MEDICATED_PAD | Freq: Every day | CUTANEOUS | Status: DC
  Administered 2022-03-24: 6 via TOPICAL

## 2022-03-24 MED ORDER — PROPOFOL 10 MG/ML IV BOLUS
INTRAVENOUS | Status: AC
  Filled 2022-03-24: qty 20

## 2022-03-24 MED ORDER — LIDOCAINE-EPINEPHRINE 0.5 %-1:200000 IJ SOLN
INTRAMUSCULAR | Status: DC | PRN
  Administered 2022-03-24: 10 mL

## 2022-03-24 MED ORDER — PHENYLEPHRINE 80 MCG/ML (10ML) SYRINGE FOR IV PUSH (FOR BLOOD PRESSURE SUPPORT)
PREFILLED_SYRINGE | INTRAVENOUS | Status: DC | PRN
  Administered 2022-03-24 (×2): 80 ug via INTRAVENOUS
  Administered 2022-03-24 (×2): 160 ug via INTRAVENOUS
  Administered 2022-03-24 (×2): 80 ug via INTRAVENOUS

## 2022-03-24 MED ORDER — DEXAMETHASONE 4 MG PO TABS
4.0000 mg | ORAL_TABLET | Freq: Three times a day (TID) | ORAL | Status: DC
  Administered 2022-03-25 – 2022-03-26 (×5): 4 mg via ORAL
  Filled 2022-03-24 (×4): qty 1

## 2022-03-24 MED ORDER — ONDANSETRON HCL 4 MG/2ML IJ SOLN
INTRAMUSCULAR | Status: AC
  Filled 2022-03-24: qty 2

## 2022-03-24 MED ORDER — SODIUM CHLORIDE 0.9% IV SOLUTION
Freq: Once | INTRAVENOUS | Status: AC
Start: 2022-03-24 — End: 2022-03-24

## 2022-03-24 MED ORDER — ROCURONIUM BROMIDE 10 MG/ML (PF) SYRINGE
PREFILLED_SYRINGE | INTRAVENOUS | Status: DC | PRN
  Administered 2022-03-24: 50 mg via INTRAVENOUS
  Administered 2022-03-24: 30 mg via INTRAVENOUS
  Administered 2022-03-24: 50 mg via INTRAVENOUS

## 2022-03-24 MED ORDER — PHENYLEPHRINE HCL-NACL 20-0.9 MG/250ML-% IV SOLN
INTRAVENOUS | Status: DC | PRN
  Administered 2022-03-24: 20 ug/min via INTRAVENOUS

## 2022-03-24 MED ORDER — SODIUM CHLORIDE 0.9 % IV SOLN: INTRAVENOUS | Status: DC

## 2022-03-24 MED ORDER — ORAL CARE MOUTH RINSE
15.0000 mL | Freq: Once | OROMUCOSAL | Status: AC
Start: 2022-03-24 — End: 2022-03-24

## 2022-03-24 MED ORDER — LIDOCAINE-EPINEPHRINE 0.5 %-1:200000 IJ SOLN
INTRAMUSCULAR | Status: AC
  Filled 2022-03-24: qty 1

## 2022-03-24 MED ORDER — FLEET ENEMA 7-19 GM/118ML RE ENEM: 1.0000 | ENEMA | Freq: Once | RECTAL | Status: DC | PRN

## 2022-03-24 MED ORDER — SODIUM CHLORIDE 0.9 % IV SOLN: INTRAVENOUS | Status: DC | PRN

## 2022-03-24 MED ORDER — PROPOFOL 10 MG/ML IV BOLUS
INTRAVENOUS | Status: DC | PRN
  Administered 2022-03-24: 130 mg via INTRAVENOUS

## 2022-03-24 MED ORDER — POTASSIUM CHLORIDE IN NACL 20-0.9 MEQ/L-% IV SOLN
INTRAVENOUS | Status: DC
  Filled 2022-03-24: qty 1000

## 2022-03-24 MED ORDER — SENNA 8.6 MG PO TABS
1.0000 | ORAL_TABLET | Freq: Two times a day (BID) | ORAL | Status: DC
  Administered 2022-03-24 – 2022-03-26 (×4): 8.6 mg via ORAL
  Filled 2022-03-24 (×4): qty 1

## 2022-03-24 MED ORDER — BACITRACIN ZINC 500 UNIT/GM EX OINT
TOPICAL_OINTMENT | CUTANEOUS | Status: AC
  Filled 2022-03-24: qty 28.35

## 2022-03-24 MED ORDER — SENNOSIDES-DOCUSATE SODIUM 8.6-50 MG PO TABS: 1.0000 | ORAL_TABLET | Freq: Every evening | ORAL | Status: DC | PRN

## 2022-03-24 MED ORDER — LIDOCAINE 2% (20 MG/ML) 5 ML SYRINGE
INTRAMUSCULAR | Status: DC | PRN
  Administered 2022-03-24: 60 mg via INTRAVENOUS

## 2022-03-24 MED ORDER — THROMBIN 20000 UNITS EX SOLR
CUTANEOUS | Status: DC | PRN
  Administered 2022-03-24: 20 mL via TOPICAL

## 2022-03-24 MED ORDER — THROMBIN 20000 UNITS EX SOLR
CUTANEOUS | Status: AC
  Filled 2022-03-24: qty 20000

## 2022-03-24 MED ORDER — 0.9 % SODIUM CHLORIDE (POUR BTL) OPTIME
TOPICAL | Status: DC | PRN
  Administered 2022-03-24: 3000 mL

## 2022-03-24 MED ORDER — CHLORHEXIDINE GLUCONATE 0.12 % MT SOLN
OROMUCOSAL | Status: AC
  Administered 2022-03-24: 15 mL via OROMUCOSAL
  Filled 2022-03-24: qty 15

## 2022-03-24 SURGICAL SUPPLY — 93 items
APL SKNCLS STERI-STRIP NONHPOA (GAUZE/BANDAGES/DRESSINGS)
BAG COUNTER SPONGE SURGICOUNT (BAG) ×3 IMPLANT
BAG SPNG CNTER NS LX DISP (BAG) ×1
BAND INSRT 18 STRL LF DISP RB (MISCELLANEOUS) ×2
BAND RUBBER #18 3X1/16 STRL (MISCELLANEOUS) ×2 IMPLANT
BENZOIN TINCTURE PRP APPL 2/3 (GAUZE/BANDAGES/DRESSINGS) IMPLANT
BIT DRILL WIRE PASS 1.3MM (BIT) IMPLANT
BLADE CLIPPER SURG (BLADE) ×3 IMPLANT
BLADE SAW GIGLI 16 STRL (MISCELLANEOUS) IMPLANT
BLADE SURG 15 STRL LF DISP TIS (BLADE) IMPLANT
BLADE SURG 15 STRL SS (BLADE)
BLADE ULTRA TIP 2M (BLADE) IMPLANT
BNDG GAUZE ELAST 4 BULKY (GAUZE/BANDAGES/DRESSINGS) IMPLANT
BUR ACORN 6.0 PRECISION (BURR) ×3 IMPLANT
BUR MATCHSTICK NEURO 3.0 LAGG (BURR) ×1 IMPLANT
BUR SPIRAL ROUTER 2.3 (BUR) IMPLANT
CANISTER SUCT 3000ML PPV (MISCELLANEOUS) ×3 IMPLANT
CARTRIDGE OIL MAESTRO DRILL (MISCELLANEOUS) ×2 IMPLANT
CATH VENTRIC 35X38 W/TROCAR LG (CATHETERS) IMPLANT
CLIP VESOCCLUDE MED 6/CT (CLIP) IMPLANT
CNTNR URN SCR LID CUP LEK RST (MISCELLANEOUS) ×2 IMPLANT
CONT SPEC 4OZ STRL OR WHT (MISCELLANEOUS) ×2
COVER BURR HOLE 7 (Orthopedic Implant) ×2 IMPLANT
DIFFUSER DRILL AIR PNEUMATIC (MISCELLANEOUS) ×3 IMPLANT
DRAIN SUBARACHNOID (WOUND CARE) ×1 IMPLANT
DRAPE CAMERA VIDEO/LASER (DRAPES) IMPLANT
DRAPE MICROSCOPE LEICA (MISCELLANEOUS) ×1 IMPLANT
DRAPE NEUROLOGICAL W/INCISE (DRAPES) ×3 IMPLANT
DRAPE ORTHO SPLIT 77X108 STRL (DRAPES)
DRAPE SURG 17X23 STRL (DRAPES) IMPLANT
DRAPE SURG ORHT 6 SPLT 77X108 (DRAPES) IMPLANT
DRAPE WARM FLUID 44X44 (DRAPES) ×3 IMPLANT
DRILL WIRE PASS 1.3MM (BIT) ×2
DRSG TELFA 3X8 NADH STRL (GAUZE/BANDAGES/DRESSINGS) ×1 IMPLANT
DURAPREP 6ML APPLICATOR 50/CS (WOUND CARE) ×3 IMPLANT
ELECT REM PT RETURN 9FT ADLT (ELECTROSURGICAL) ×2
ELECTRODE REM PT RTRN 9FT ADLT (ELECTROSURGICAL) ×2 IMPLANT
EVACUATOR 1/8 PVC DRAIN (DRAIN) IMPLANT
EVACUATOR SILICONE 100CC (DRAIN) IMPLANT
GAUZE 4X4 16PLY ~~LOC~~+RFID DBL (SPONGE) IMPLANT
GAUZE SPONGE 4X4 12PLY STRL (GAUZE/BANDAGES/DRESSINGS) ×2 IMPLANT
GLOVE BIO SURGEON STRL SZ 6.5 (GLOVE) ×4 IMPLANT
GLOVE BIOGEL PI IND STRL 6.5 (GLOVE) IMPLANT
GLOVE BIOGEL PI IND STRL 7.0 (GLOVE) IMPLANT
GLOVE BIOGEL PI INDICATOR 6.5 (GLOVE) ×2
GLOVE BIOGEL PI INDICATOR 7.0 (GLOVE) ×1
GLOVE ECLIPSE 6.5 STRL STRAW (GLOVE) ×3 IMPLANT
GLOVE EXAM NITRILE XL STR (GLOVE) IMPLANT
GOWN STRL REUS W/ TWL LRG LVL3 (GOWN DISPOSABLE) ×4 IMPLANT
GOWN STRL REUS W/ TWL XL LVL3 (GOWN DISPOSABLE) IMPLANT
GOWN STRL REUS W/TWL 2XL LVL3 (GOWN DISPOSABLE) IMPLANT
GOWN STRL REUS W/TWL LRG LVL3 (GOWN DISPOSABLE) ×6
GOWN STRL REUS W/TWL XL LVL3 (GOWN DISPOSABLE) ×2
GRAFT SUTURABLE BP 6CMX8CM (Tissue) ×1 IMPLANT
HEMOSTAT SURGICEL 2X14 (HEMOSTASIS) IMPLANT
HOOK DURA 1/2IN (MISCELLANEOUS) ×2 IMPLANT
KIT BASIN OR (CUSTOM PROCEDURE TRAY) ×3 IMPLANT
KIT DRAIN CSF ACCUDRAIN (MISCELLANEOUS) IMPLANT
KIT TURNOVER KIT B (KITS) ×3 IMPLANT
NDL HYPO 25X1 1.5 SAFETY (NEEDLE) ×2 IMPLANT
NDL SPNL 18GX3.5 QUINCKE PK (NEEDLE) IMPLANT
NEEDLE HYPO 25X1 1.5 SAFETY (NEEDLE) ×2 IMPLANT
NEEDLE SPNL 18GX3.5 QUINCKE PK (NEEDLE) IMPLANT
NS IRRIG 1000ML POUR BTL (IV SOLUTION) ×9 IMPLANT
OIL CARTRIDGE MAESTRO DRILL (MISCELLANEOUS) ×2
PACK CRANIOTOMY CUSTOM (CUSTOM PROCEDURE TRAY) ×3 IMPLANT
PATTIES SURGICAL .25X.25 (GAUZE/BANDAGES/DRESSINGS) IMPLANT
PATTIES SURGICAL .5 X.5 (GAUZE/BANDAGES/DRESSINGS) IMPLANT
PATTIES SURGICAL .5 X3 (DISPOSABLE) IMPLANT
PATTIES SURGICAL 1/4 X 3 (GAUZE/BANDAGES/DRESSINGS) IMPLANT
PATTIES SURGICAL 1X1 (DISPOSABLE) ×1 IMPLANT
PLATE CRANIAL CMF UNIV (Plate) ×4 IMPLANT
SCREW UNIII AXS SD 1.5X4 (Screw) ×22 IMPLANT
SPECIMEN JAR SMALL (MISCELLANEOUS) IMPLANT
SPIKE FLUID TRANSFER (MISCELLANEOUS) ×3 IMPLANT
SPONGE NEURO XRAY DETECT 1X3 (DISPOSABLE) ×2 IMPLANT
SPONGE SURGIFOAM ABS GEL 100 (HEMOSTASIS) ×3 IMPLANT
STAPLER VISISTAT 35W (STAPLE) ×3 IMPLANT
SUT ETHILON 3 0 FSL (SUTURE) IMPLANT
SUT ETHILON 3 0 PS 1 (SUTURE) IMPLANT
SUT NURALON 4 0 TR CR/8 (SUTURE) ×9 IMPLANT
SUT SILK 0 FSL (SUTURE) ×1 IMPLANT
SUT SILK 0 TIES 10X30 (SUTURE) IMPLANT
SUT STEEL 0 (SUTURE)
SUT STEEL 0 18XMFL TIE 17 (SUTURE) IMPLANT
SUT VIC AB 2-0 CT2 18 VCP726D (SUTURE) ×6 IMPLANT
TAPE CLOTH SURG 4X10 WHT LF (GAUZE/BANDAGES/DRESSINGS) ×1 IMPLANT
TOWEL GREEN STERILE (TOWEL DISPOSABLE) ×3 IMPLANT
TOWEL GREEN STERILE FF (TOWEL DISPOSABLE) ×3 IMPLANT
TRAY FOLEY MTR SLVR 16FR STAT (SET/KITS/TRAYS/PACK) ×3 IMPLANT
TUBE CONNECTING 12X1/4 (SUCTIONS) ×3 IMPLANT
UNDERPAD 30X36 HEAVY ABSORB (UNDERPADS AND DIAPERS) ×2 IMPLANT
WATER STERILE IRR 1000ML POUR (IV SOLUTION) ×3 IMPLANT

## 2022-03-24 NOTE — Anesthesia Procedure Notes (Signed)
Central Venous Catheter Insertion Performed by: Murvin Natal, MD, anesthesiologist Start/End7/11/2021 2:20 PM, 03/24/2022 2:30 PM Patient location: OR. Preanesthetic checklist: patient identified, IV checked, site marked, risks and benefits discussed, surgical consent, monitors and equipment checked, pre-op evaluation, timeout performed and anesthesia consent Position: Trendelenburg Patient sedated Hand hygiene performed , maximum sterile barriers used  and Seldinger technique used Catheter size: 8 Fr Total catheter length 16. Central line was placed.Double lumen Procedure performed using ultrasound guided technique. Ultrasound Notes:anatomy identified, needle tip was noted to be adjacent to the nerve/plexus identified, no ultrasound evidence of intravascular and/or intraneural injection and image(s) printed for medical record Attempts: 1 Following insertion, dressing applied, line sutured and Biopatch. Post procedure assessment: blood return through all ports, free fluid flow and no air  Patient tolerated the procedure well with no immediate complications.

## 2022-03-24 NOTE — Anesthesia Procedure Notes (Signed)
Arterial Line Insertion Start/End7/11/2021 12:40 PM, 03/24/2022 12:50 PM Performed by: Harden Mo, CRNA, CRNA  Patient location: Pre-op. Preanesthetic checklist: patient identified, IV checked, site marked, risks and benefits discussed, surgical consent, monitors and equipment checked, pre-op evaluation, timeout performed and anesthesia consent Lidocaine 1% used for infiltration Left, radial was placed Catheter size: 20 G Hand hygiene performed  and maximum sterile barriers used   Attempts: 1 Procedure performed without using ultrasound guided technique. Ultrasound Notes:anatomy identified, needle tip was noted to be adjacent to the nerve/plexus identified and no ultrasound evidence of intravascular and/or intraneural injection Following insertion, dressing applied and Biopatch. Post procedure assessment: normal and unchanged  Patient tolerated the procedure well with no immediate complications. Additional procedure comments: Placed by Salvadore Oxford, SRNA.

## 2022-03-24 NOTE — Transfer of Care (Signed)
Immediate Anesthesia Transfer of Care Note  Patient: Tara [name]  Procedure(s) Performed: Bifrontal craniotomy for resection of meningioma (Bilateral: Head)  Patient Location: PACU  Anesthesia Type:General  Level of Consciousness: awake, alert  and oriented  Airway & Oxygen Therapy: Patient Spontanous Breathing and Patient connected to face mask oxygen  Post-op Assessment: Report given to RN and Post -op Vital signs reviewed and stable  Post vital signs: Reviewed and stable  Last Vitals:  Vitals Value Taken Time  BP 152/74 03/24/22 1839  Temp    Pulse 69 03/24/22 1841  Resp 12 03/24/22 1841  SpO2 100 % 03/24/22 1841  Vitals shown include unvalidated device data.  Last Pain:  Vitals:   03/24/22 1220  TempSrc: Oral  PainSc:       Patients Stated Pain Goal: 0 (14/48/18 5631)  Complications: No notable events documented.

## 2022-03-24 NOTE — Progress Notes (Signed)
Subjective: No further seizures overnight.  Husband, daughter and son at bedside.  Denies any concerns.  ROS: negative except above  Examination  Vital signs in last 24 hours: Temp:  [97.5 F (36.4 C)-99.1 F (37.3 C)] 98.5 F (36.9 C) (07/03 0900) Pulse Rate:  [64-92] 64 (07/03 0900) Resp:  [12-18] 12 (07/03 0900) BP: (122-136)/(69-72) 122/72 (07/03 0900) SpO2:  [93 %-96 %] 96 % (07/03 0900)  General: lying in bed, NAD Neuro: MS: Alert, oriented, follows commands CN: pupils equal and reactive,  EOMI, face symmetric, tongue midline, normal sensation over face, Motor: 4/5 strength in all 4 extremities Sensory: Intact to light touch in upper extremities  Basic Metabolic Panel: Recent Labs  Lab 03/18/22 0335 03/20/22 0451 03/22/22 0315 03/23/22 0248 03/24/22 0412  NA 134* 133* 135 133* 134*  K 4.0 4.1 4.2 3.8 4.1  CL 103 105 101 101 98  CO2 '22 22 23 23 22  '$ GLUCOSE 129* 128* 153* 175* 199*  BUN 40* 32* 32* 29* 32*  CREATININE 1.43* 1.24* 1.05* 1.10* 1.24*  CALCIUM 8.9 8.3* 8.3* 8.0* 8.3*  MG  --   --   --   --  2.3    CBC: Recent Labs  Lab 03/20/22 0451 03/22/22 0315 03/23/22 0248 03/24/22 0412  WBC 12.5* 12.7* 13.1* 10.9*  NEUTROABS 11.5*  --   --   --   HGB 8.1* 8.1* 8.1* 8.5*  HCT 24.5* 24.3* 25.3* 26.5*  MCV 85.7 85.3 85.8 87.2  PLT 143* 135* 151 146*     Coagulation Studies: No results for input(s): "LABPROT", "INR" in the last 72 hours.  Imaging MRI brain with and without contrast 03/15/2022: Long-standing planum sphenoidale meningioma with slow growth. Current dimensions are 3 x 3 x 2 cm. Possibly related to the history of seizure, there is new vasogenic edema in the adjacent left frontal lobe. Small chronic cerebellar infarcts.   ASSESSMENT AND PLAN: 76 year old female with meningioma who presented with seizure.  Meningioma Cerebral edema Epilepsy -No further seizures overnight  Recommendations -Continue Keppra 500 mg twice daily, Vimpat 200  mg twice daily and Depakote 425 mg every 8 hours -Once patient is able to take p.o., recommend switching to Keppra 500 mg twice daily, Vimpat 200 mg twice daily and Depakote 750 mg twice daily -Plan to stop Depakote if patient remains seizure-free about 1 to 2 weeks after surgery -DC LTM EEG as no seizures overnight -Continue seizure precautions -Discussed plan with patient, family at bedside - Management of rest of comorbidities per primary team  I have spent a total of  36 minutes with the patient reviewing hospital notes,  test results, labs and examining the patient as well as establishing an assessment and plan that was discussed personally with the patient.  > 50% of time was spent in direct patient care.  Zeb Comfort Epilepsy [facility] For questions after 5pm please refer to AMION to reach the Neurologist on call

## 2022-03-24 NOTE — Progress Notes (Signed)
SLP Cancellation Note  Patient Details Name: Tara [name] MRN: [id number] DOB: 30-Oct-1945   Cancelled treatment:       Reason Eval/Treat Not Completed: Patient at procedure or test/unavailable; pt pending sx for tumor resection; ST will f/u as able for diet tolerance.   Elvina Sidle, M.S., CCC-SLP 03/24/2022, 9:10 AM

## 2022-03-24 NOTE — Progress Notes (Signed)
vLTM discontinued.  No skin breakdown  [facility] notified.

## 2022-03-24 NOTE — Procedures (Signed)
Patient Name: Tara [name]  MRN: [id number]  Epilepsy Attending: Lora Havens  Referring Physician/Provider: August Albino, NP  Duration: 03/23/2022 1531 to 03/24/2022 1134   Patient history: 76 y.o. female with past medical history of anxiety, depression, GERD, hypertension, hyperlipidemia, nonischemic cardiomyopathy, atrial fibrillation on eliquis, Systolic HF, hx of breast cancer, meningioma who presented to the hospital on 03/15/2022 for evaluation of new onset seizures.   Level of alertness: Awake, asleep   AEDs during EEG study: LEV, LCM, VPA, Clonazepam   Technical aspects: This EEG study was done with scalp electrodes positioned according to the 10-20 International system of electrode placement. Electrical activity was acquired at a sampling rate of '500Hz'$  and reviewed with a high frequency filter of '70Hz'$  and a low frequency filter of '1Hz'$ . EEG data were recorded continuously and digitally stored.    Description:  The posterior dominant rhythm consists of 7 Hz activity of moderate voltage (25-35 uV) seen predominantly in posterior head regions, symmetric and reactive to eye opening and eye closing. Sleep was characterized by vertex waves, sleep spindles (12-'14Hz'$ ), maximal frontocentral region. EEG also showed continuous generalized and lateralized right hemisphere predominantly 5 to 9 Hz theta alpha activity admixed with intermittent 2 to 3 Hz delta slowing in right hemisphere. Of note, parts of study were difficult to interpret due to significant chewing artifact which appeared more prominent on the right side and noted predominantly when patient was awake. Hyperventilation and photic stimulation were not performed.     Patient event button was pressed on 03/23/2022 at 2300.  Per family patient was smacking her lips. Concomitant EEG showed significant chewing artifact but no definite EEG changes suggest seizure.   ABNORMALITY - Continuous slow, right fronto-temporal region - Background  slow   IMPRESSION: This study is suggestive of cortical dysfunction arising from right fronto-temporal region likely secondary to underlying structural abnormality. Additionally there is mild to moderate diffuse encephalopathy, nonspecific etiology. No seizures or epileptiform discharges were seen throughout the recording.  One event was recorded on 03/23/2022 at 2300 during which family reported patient was making smacking her lips without concomitant EEG change.  This was most likely not an epileptic event.   Joani Cosma Barbra Sarks

## 2022-03-24 NOTE — Progress Notes (Signed)
Patient ID: Tara [name], female   DOB: 08/03/1946, 76 y.o.   MRN: [id number] BP 128/62   Pulse 65   Temp 98.4 F (36.9 C) (Oral)   Resp 15   Ht '4\' 9"'$  (1.448 m)   Wt 85 kg   SpO2 92%   BMI 40.55 kg/m  Lethargic, follows commands Seized this weekend, third anticonvulsant added Will proceed with craniotomy.

## 2022-03-24 NOTE — Anesthesia Procedure Notes (Signed)
Procedure Name: Intubation Date/Time: 03/24/2022 2:16 PM  Performed by: Harden Mo, CRNAPre-anesthesia Checklist: Patient identified, Emergency Drugs available, Suction available and Patient being monitored Patient Re-evaluated:Patient Re-evaluated prior to induction Oxygen Delivery Method: Circle System Utilized Preoxygenation: Pre-oxygenation with 100% oxygen Induction Type: IV induction Ventilation: Mask ventilation without difficulty, Two handed mask ventilation required and Oral airway inserted - appropriate to patient size Laryngoscope Size: Glidescope and 3 Grade View: Grade I Tube type: Oral Tube size: 7.5 mm Number of attempts: 1 Airway Equipment and Method: Stylet and Oral airway Placement Confirmation: ETT inserted through vocal cords under direct vision, positive ETCO2 and breath sounds checked- equal and bilateral Secured at: 22 cm Tube secured with: Tape Dental Injury: Teeth and Oropharynx as per pre-operative assessment  Comments: Intubation by Salvadore Oxford, SRNA

## 2022-03-24 NOTE — Anesthesia Preprocedure Evaluation (Addendum)
Anesthesia Evaluation  Patient identified by MRN, date of birth, ID band Patient awake    Reviewed: Allergy & Precautions, NPO status , Patient's Chart, lab work & pertinent test results  Airway Mallampati: III  TM Distance: >3 FB Neck ROM: Full    Dental no notable dental hx.    Pulmonary neg pulmonary ROS,    Pulmonary exam normal        Cardiovascular hypertension, +CHF  Normal cardiovascular exam+ dysrhythmias      Neuro/Psych Seizures -,  PSYCHIATRIC DISORDERS Anxiety Depression  Neuromuscular disease    GI/Hepatic Neg liver ROS, hiatal hernia, GERD  Medicated and Controlled,  Endo/Other  negative endocrine ROS  Renal/GU Renal disease     Musculoskeletal negative musculoskeletal ROS (+)   Abdominal (+) + obese,   Peds  Hematology  (+) Blood dyscrasia, anemia ,   Anesthesia Other Findings Meningioma  Reproductive/Obstetrics                            Anesthesia Physical Anesthesia Plan  ASA: 3  Anesthesia Plan: General   Post-op Pain Management:    Induction: Intravenous  PONV Risk Score and Plan: 3 and Ondansetron, Dexamethasone and Treatment may vary due to age or medical condition  Airway Management Planned: Oral ETT  Additional Equipment: Arterial line and CVP  Intra-op Plan:   Post-operative Plan: Possible Post-op intubation/ventilation  Informed Consent: I have reviewed the patients History and Physical, chart, labs and discussed the procedure including the risks, benefits and alternatives for the proposed anesthesia with the patient or authorized representative who has indicated his/her understanding and acceptance.     Dental advisory given and Consent reviewed with POA  Plan Discussed with: CRNA  Anesthesia Plan Comments: (Anesthetic plan discussed with daughter  Right arm restricted Left arm swollen  Will place central line )       Anesthesia Quick  Evaluation

## 2022-03-24 NOTE — Anesthesia Postprocedure Evaluation (Signed)
Anesthesia Post Note  Patient: Tara [name]  Procedure(s) Performed: Bifrontal craniotomy for resection of meningioma (Bilateral: Head)     Patient location during evaluation: PACU Anesthesia Type: General Level of consciousness: awake and alert Pain management: pain level controlled Vital Signs Assessment: post-procedure vital signs reviewed and stable Respiratory status: spontaneous breathing, nonlabored ventilation, respiratory function stable and patient connected to nasal cannula oxygen Cardiovascular status: stable and blood pressure returned to baseline Anesthetic complications: no   No notable events documented.  Last Vitals:  Vitals:   03/24/22 1925 03/24/22 2000  BP: (!) 153/73   Pulse: 63 72  Resp: 16 20  Temp: 36.9 C   SpO2: 94% 96%    Last Pain:  Vitals:   03/24/22 1910  TempSrc:   PainSc: 0-No pain                 Audry Pili

## 2022-03-24 NOTE — Progress Notes (Signed)
LTM maintenance complete.  No skin breakdown.

## 2022-03-24 NOTE — Progress Notes (Signed)
PT Cancellation Note  Patient Details Name: Tara [name] MRN: [id number] DOB: [birth year]   Cancelled Treatment:    Reason Eval/Treat Not Completed: Patient at procedure or test/unavailable. Pt going for surgery today for tumor resection. Acute PT to return s/p surgery as appropriate, as able to re-evaluate pt mobility and cognition.  Kittie Plater, PT, DPT Acute Rehabilitation Services Secure chat preferred Office #: [phone]    Berline Lopes 03/24/2022, 9:04 AM

## 2022-03-24 NOTE — Progress Notes (Signed)
PROGRESS NOTE        PATIENT DETAILS Name: Tara [name] Age: 76 y.o. Sex: female Date of Birth: [birth year] Admit Date: 03/15/2022 Admitting Physician Rolla Plate, DO FMB:[id number], Norwood Levo, MD  Brief Summary: Patient is a 76 y.o.  female with a history of HTN, HLD, CKD stage IIIb, chronic HFpEF, meningioma, breast CA-s/p right lumpectomy and chemoradiation in 2008, depression/anxiety who was hospitalized from 6/8-6/22 for AKI/A-fib RVR with hypotension-presented to [facility] ED with new onset seizures.    Significant events: 6/08-6/22>> hospitalization for weakness-AKI-A-fib RVR with hypotension-meningioma-subsequently discharged home after stabilization.   6/24>> presented to [facility] ED with seizure-transferred to [facility].  Significant studies: 6/08>> CT head: Interval increase in size of previously noted planum sphenoidal meningioma. 6/08>> CT C-spine: No fracture or dislocation 6/09>> Echo: EF 55% 6/12>> TSH: 0.429 6/13>> CXR: No PNA 6/24>> CT head: No acute process-stable sphenoidal meningioma 2.4 x 3.3 x 2.8 cm-hypodense lesion is seen in the frontal lobe medially to the adjacent mass-representing edema/infarct. 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 6/28>> Keppra level: 37.1 (normal limits)  Significant microbiology data: 6/08>> influenza/COVID PCR: Negative 6/13>> blood culture: Negative  Procedures: None  Consults: Neurology Neurosurgery  Subjective:  Patient in bed, appears comfortable, denies any headache, no fever, no chest pain or pressure, no shortness of breath , no abdominal pain. No new focal weakness.   Objective: Vitals: Blood pressure 122/72, pulse 64, temperature 98.5 F (36.9 C), temperature source Oral, resp. rate 12, height 4'  9" (1.448 m), weight 85 kg, SpO2 96 %.   Exam:  Awake Alert, No new F.N deficits, Normal affect Flanagan.AT,PERRAL Supple Neck, No JVD,   Symmetrical Chest wall movement, Good air movement bilaterally, CTAB RRR,No Gallops, Rubs or new Murmurs,  +ve B.Sounds, Abd Soft, No tenderness,   No Cyanosis, Clubbing or edema    Assessment/Plan:  New onset seizure: No further seizures-on Keppra/Vimpat.  Due to concern for sedation/lethargy/difficulty ambulation-Keppra dose reduced to 500 mg p.o. twice daily on 6/28.  She is relatively awake and alert-and moving all 4 extremities.   Meningioma: She remains on IV Decadron.  Given intermittent encephalopathy/deconditioning-neurosurgery planning on craniotomy on 03/24/2022.  I  am holding heparin drip from 10 PM on 03/23/2022, resume once okay by neurosurgery.  PAF: Maintaining sinus rhythm-continue amiodarone-Eliquis stopped on 6/24 in anticipation of craniotomy-currently on IV heparin.  Kindly see above.  Acute toxic metabolic encephalopathy: Likely due to Keppra-much better after adjustment of dose on 6/28-see above  Chronic HFpEF EF 55% on echocardiogram done last month: Volume status stable  HTN: BP stable without use of any antihypertensives  HLD: Continue Crestor  CKD stage IIIb: At baseline-watch closely.  Normocytic anemia: Due to CKD-hemoglobin stable-we will transfuse if significant drop.  Leukocytosis: No indication of infection-likely due to steroid use.  Depression/anxiety: Continue Zoloft/Klonopin  GERD: Continue PPI  Back pain: Likely musculoskeletal in etiology-MRI of the lumbosacral spine without any significant pathology.    Acute on chronic debility/deconditioning: Appears weak/debilitated-family refused SNF last admit and took her home-suspect she would most benefit from going to subacute rehab on discharge-spouse aware of recommendations.  Prediabetes (A1c 6.3 on 6/8)-with steroid-induced hyperglycemia: Monitor CBGs on  SSI.  Recent Labs    03/23/22 1656 03/23/22 2203 03/24/22 0640  GLUCAP 180* 190* 183*    Morbid Obesity: Estimated body mass index is 40.55 kg/m as calculated from the following:   Height as of this encounter: '4\' 9"'$  (1.448 m).   Weight as of this encounter: 85 kg.    Code status:   Code Status: Full Code   DVT Prophylaxis: Place TED hose Start: 03/24/22 0616 Place and maintain sequential compression device Start: 03/23/22 0631 SCDs Start: 03/15/22 0454 IV heparin    Family Communication: Spouse at bedside 03/23/22  Disposition Plan: Status is: Inpatient Remains inpatient appropriate because: New onset seizure-craniotomy on Monday-will require several more days of hospitalization.   Planned Discharge Destination:Skilled nursing facility   Diet: Diet Order             Diet NPO time specified  Diet effective midnight                     Antimicrobial agents: Anti-infectives (From admission, onward)    None        MEDICATIONS: Scheduled Meds:  amiodarone  200 mg Oral Daily   clonazePAM  0.5 mg Oral QHS   dexamethasone (DECADRON) injection  4 mg Intravenous Q6H   insulin aspart  0-5 Units Subcutaneous QHS   insulin aspart  0-9 Units Subcutaneous TID WC   lacosamide  200 mg Oral BID   montelukast  10 mg Oral QHS   pantoprazole sodium  40 mg Oral Daily   rosuvastatin  20 mg Oral Daily   sertraline  25 mg Oral Daily   Continuous Infusions:  lactated ringers 75 mL/hr at 03/24/22 0648   levETIRAcetam 500 mg (03/24/22 0856)   valproate sodium 425 mg (03/24/22 0933)   PRN Meds:.acetaminophen **OR** acetaminophen, antiseptic oral rinse, guaiFENesin, hydrALAZINE, metoprolol tartrate, morphine injection, ondansetron **OR** ondansetron (ZOFRAN) IV, oxyCODONE, senna-docusate   I have personally reviewed following labs and imaging studies  LABORATORY DATA:  Recent Labs  Lab 03/20/22 0451 03/22/22 0315 03/23/22 0248 03/24/22 0412  WBC 12.5* 12.7*  13.1* 10.9*  HGB 8.1* 8.1* 8.1* 8.5*  HCT 24.5* 24.3* 25.3* 26.5*  PLT 143* 135* 151 146*  MCV 85.7 85.3 85.8 87.2  MCH 28.3 28.4 27.5 28.0  MCHC 33.1 33.3 32.0 32.1  RDW 15.7* 15.6* 15.2 15.4  LYMPHSABS 0.4*  --   --   --   MONOABS 0.5  --   --   --   EOSABS 0.0  --   --   --   BASOSABS 0.0  --   --   --     Recent Labs  Lab 03/18/22 0335 03/20/22 0451 03/22/22 0315 03/23/22 0248 03/24/22 0412  NA 134* 133* 135 133* 134*  K 4.0 4.1 4.2 3.8 4.1  CL 103 105 101 101 98  CO2 '22 22 23 23 22  '$ GLUCOSE 129* 128* 153* 175* 199*  BUN 40* 32* 32* 29* 32*  CREATININE 1.43* 1.24* 1.05* 1.10* 1.24*  CALCIUM 8.9 8.3* 8.3* 8.0* 8.3*  AST  --  17  --   --   --   ALT  --  36  --   --   --   ALKPHOS  --  43  --   --   --   BILITOT  --  0.6  --   --   --   ALBUMIN  --  2.6*  --   --   --   MG  --   --   --   --  2.3  BNP  --   --   --   --  41.5     LOS: 9 days   Signature  Lala Lund M.D on 03/24/2022 at 9:51 AM   -  To page go to [email or web address]

## 2022-03-24 NOTE — Anesthesia Procedure Notes (Signed)
Central Venous Catheter Insertion Performed by: Murvin Natal, MD, anesthesiologist Start/End7/11/2021 12:15 PM, 03/24/2022 12:30 PM Patient location: Pre-op. Preanesthetic checklist: patient identified, IV checked, site marked, risks and benefits discussed, surgical consent, monitors and equipment checked, pre-op evaluation, timeout performed and anesthesia consent Position: supine Lidocaine 1% used for infiltration and patient sedated Hand hygiene performed , maximum sterile barriers used  and Seldinger technique used Catheter size: 8 Fr Total catheter length 16. Central line was placed.Double lumen Procedure performed without using ultrasound guided technique. Attempts: 1 Following insertion, dressing applied, line sutured and Biopatch. Post procedure assessment: blood return through all ports, free fluid flow and no air  Patient tolerated the procedure well with no immediate complications.

## 2022-03-24 NOTE — Op Note (Signed)
03/24/2022  6:33 PM  PATIENT:  Tara [name]  76 y.o. female  PRE-OPERATIVE DIAGNOSIS:  Meningioma  POST-OPERATIVE DIAGNOSIS:  planum sphenoidale Meningioma  PROCEDURE:  Procedure(s): Bifrontal craniotomy for resection of meningioma  SURGEON: Surgeon(s): Ashok Pall, MD  ASSISTANTS:none  ANESTHESIA:   general  EBL:  Total I/O In: 9244 [I.V.:900; Blood:630] Out: 6286 [Urine:625; Blood:750]  BLOOD ADMINISTERED:none  COUNT:per nursing  DRAINS: none   SPECIMEN:  Source of Specimen:  frontal fossa  DICTATION: Tara [name] was taken to the operating room, intubated, and placed under a general anesthetic  without difficulty. She was positioned supine with her head on a horseshoe headrest. Her head shaved, was prepped and was draped in a sterile manner. I made an incision in the coronal plane posterior to the hairline extending from the zygoma to zygoma. I placed Raney clips on the scalp edges, then reflected the scalp flap rostrally. I placed rolled raytecs behind the flap to avoid any blood flow problems. I reflected the flap to the supraorbital notches bilaterally. I drilled burr holes in the pterions bilaterally. I then drilled two more burr holes directly over the superior sagittal sinus to allow for the sinus dura to be dissected from the skull. I then used the craniotome to connect the burr holes To elevate the skull flap.  I opened the dura to the sinus on both sides. I placed a 1-0 silk in a trifold manner and tied the sinus proximally, then distally in the same manner. I used scissors to divide the sinus and exposed the brain.  I used cotton patties to gently separate the brain from the mass which was quickly exposed.  I used bipolar cautery to dissect the tumor, and to coagulate and shrink the capsule of the tumor. I encircled the tumor, placed more cotton patties around the tumor to protect the brain. I eventually had secured the tumor, and removed it. I coagulated the  planum, and some other bleeding points. With the tumor out, and sent to pathology I started the closure. I placed surgicel on the brain surfaces. I sewed a large dural patch in place. I replaced the skull flap with plates and screws. I closed the scalp flap with galeal sutures, and staples. I applied a sterile dressing. She was extubated and brought to pacu somnolent, but she has slowly awakened. I believed I achieved a gross total resection of the mass.  PLAN OF CARE: Admit to inpatient   PATIENT DISPOSITION:  PACU - hemodynamically stable.   Delay start of Pharmacological VTE agent (>24hrs) due to surgical blood loss or risk of bleeding:  no

## 2022-03-25 DIAGNOSIS — R569 Unspecified convulsions: Secondary | ICD-10-CM | POA: Diagnosis not present

## 2022-03-25 DIAGNOSIS — I10 Essential (primary) hypertension: Secondary | ICD-10-CM | POA: Diagnosis not present

## 2022-03-25 LAB — BASIC METABOLIC PANEL
Anion gap: 12 (ref 5–15)
BUN: 27 mg/dL — ABNORMAL HIGH (ref 8–23)
CO2: 20 mmol/L — ABNORMAL LOW (ref 22–32)
Calcium: 7.6 mg/dL — ABNORMAL LOW (ref 8.9–10.3)
Chloride: 106 mmol/L (ref 98–111)
Creatinine, Ser: 1.05 mg/dL — ABNORMAL HIGH (ref 0.44–1.00)
GFR, Estimated: 55 mL/min — ABNORMAL LOW (ref >60)
Glucose, Bld: 186 mg/dL — ABNORMAL HIGH (ref 70–99)
Potassium: 4.6 mmol/L (ref 3.5–5.1)
Sodium: 138 mmol/L (ref 135–145)

## 2022-03-25 LAB — CBC
HCT: 31.7 % — ABNORMAL LOW (ref 36.0–46.0)
Hemoglobin: 10.5 g/dL — ABNORMAL LOW (ref 12.0–15.0)
MCH: 28.1 pg (ref 26.0–34.0)
MCHC: 33.1 g/dL (ref 30.0–36.0)
MCV: 84.8 fL (ref 80.0–100.0)
Platelets: 101 10*3/uL — ABNORMAL LOW (ref 150–400)
RBC: 3.74 MIL/uL — ABNORMAL LOW (ref 3.87–5.11)
RDW: 17.1 % — ABNORMAL HIGH (ref 11.5–15.5)
WBC: 14.5 10*3/uL — ABNORMAL HIGH (ref 4.0–10.5)
nRBC: 0 % (ref 0.0–0.2)

## 2022-03-25 LAB — GLUCOSE, CAPILLARY
Glucose-Capillary: 166 mg/dL — ABNORMAL HIGH (ref 70–99)
Glucose-Capillary: 211 mg/dL — ABNORMAL HIGH (ref 70–99)
Glucose-Capillary: 176 mg/dL — ABNORMAL HIGH (ref 70–99)
Glucose-Capillary: 174 mg/dL — ABNORMAL HIGH (ref 70–99)

## 2022-03-25 LAB — MAGNESIUM: Magnesium: 2.1 mg/dL (ref 1.7–2.4)

## 2022-03-25 LAB — BRAIN NATRIURETIC PEPTIDE: B Natriuretic Peptide: 55.8 pg/mL (ref 0.0–100.0)

## 2022-03-25 MED ORDER — CHLORHEXIDINE GLUCONATE CLOTH 2 % EX PADS
6.0000 | MEDICATED_PAD | Freq: Every day | CUTANEOUS | Status: DC
  Administered 2022-03-26 – 2022-05-06 (×40): 6 via TOPICAL

## 2022-03-25 MED ORDER — PANTOPRAZOLE SODIUM 40 MG PO TBEC
40.0000 mg | DELAYED_RELEASE_TABLET | Freq: Every day | ORAL | Status: DC
  Administered 2022-03-26: 40 mg via ORAL
  Filled 2022-03-25: qty 1

## 2022-03-25 MED ORDER — POTASSIUM CHLORIDE IN NACL 20-0.9 MEQ/L-% IV SOLN
INTRAVENOUS | Status: AC
  Filled 2022-03-25: qty 1000

## 2022-03-25 NOTE — Progress Notes (Signed)
Inpatient Rehab Admissions Coordinator:   Per PT recommendations pt was screened for CIR candidacy by Shann Medal, PT, DPT.  Note pt max/total +2 for limited mobility at eval.  Will follow for progress with next therapy session to assess tolerance and ability to progress mobility.  Will rescreen 7/5 after next therapy session.   Shann Medal, PT, DPT Admissions Coordinator [phone] 03/25/22  1:01 PM

## 2022-03-25 NOTE — Progress Notes (Signed)
Patient ID: Tara [name], female   DOB: 12-Jul-1946, 76 y.o.   MRN: [id number] BP 138/70 (BP Location: Right Leg)   Pulse 80   Temp 97.6 F (36.4 C) (Oral)   Resp 15   Ht '4\' 9"'$  (1.448 m)   Wt 85 kg   SpO2 97%   BMI 40.55 kg/m  Alert and oriented x4, speech is clear Moving all extremities Wound is healing well, expected periorbital edema Start heparin 48 hours post op. Dressing is dry

## 2022-03-25 NOTE — Progress Notes (Signed)
Subjective: Post-op today. States she feels fine. Husband at bedside. Denies any concerns.   ROS: negative except above*  Examination  Vital signs in last 24 hours: Temp:  [97 F (36.1 C)-98.5 F (36.9 C)] 97 F (36.1 C) (07/04 1200) Pulse Rate:  [58-88] 64 (07/04 1300) Resp:  [10-24] 24 (07/04 1300) BP: (126-160)/(64-79) 132/64 (07/04 1300) SpO2:  [91 %-100 %] 92 % (07/04 1300) Arterial Line BP: (81-166)/(61-76) 154/66 (07/04 0900)  General: lying in bed, left peri-orbital edema Neuro: A)x3, CN 2-12 appear grossly intact except left eye unable to examine due to edema, 4/5 in all extremities  Basic Metabolic Panel: Recent Labs  Lab 03/20/22 0451 03/22/22 0315 03/23/22 0248 03/24/22 0412 03/24/22 1516 03/24/22 1631 03/25/22 0512  NA 133* 135 133* 134* 130* 134* 138  K 4.1 4.2 3.8 4.1 3.9 4.5 4.6  CL 105 101 101 98  --   --  106  CO2 '22 23 23 22  '$ --   --  20*  GLUCOSE 128* 153* 175* 199*  --   --  186*  BUN 32* 32* 29* 32*  --   --  27*  CREATININE 1.24* 1.05* 1.10* 1.24*  --   --  1.05*  CALCIUM 8.3* 8.3* 8.0* 8.3*  --   --  7.6*  MG  --   --   --  2.3  --   --  2.1    CBC: Recent Labs  Lab 03/20/22 0451 03/22/22 0315 03/23/22 0248 03/24/22 0412 03/24/22 1516 03/24/22 1631 03/25/22 0512  WBC 12.5* 12.7* 13.1* 10.9*  --   --  14.5*  NEUTROABS 11.5*  --   --   --   --   --   --   HGB 8.1* 8.1* 8.1* 8.5* 6.8* 9.9* 10.5*  HCT 24.5* 24.3* 25.3* 26.5* 20.0* 29.0* 31.7*  MCV 85.7 85.3 85.8 87.2  --   --  84.8  PLT 143* 135* 151 146*  --   --  101*     Coagulation Studies: No results for input(s): "LABPROT", "INR" in the last 72 hours.  Imaging No new brain imaging overnight  ASSESSMENT AND PLAN: 76 year old female with meningioma who presented with seizure.   Meningioma Cerebral edema Epilepsy -No further seizures overnight  Recommendations -Continue Keppra 500 mg twice daily, Vimpat 200 mg twice daily and Depakote 425 mg every 8 hours -Once patient  is able to take p.o., recommend switching to Keppra 500 mg twice daily, Vimpat 200 mg twice daily and Depakote 750 mg twice daily -Plan to wean Depakote if patient remains seizure-free about 1 to 2 weeks after surgery -Continue seizure precautions -Discussed plan with patient, husband at bedside - Management of rest of comorbidities per primary team   I have spent a total of 26 minutes with the patient reviewing hospital notes,  test results, labs and examining the patient as well as establishing an assessment and plan that was discussed personally with the patient.  > 50% of time was spent in direct patient care.  Zeb Comfort Epilepsy [facility] For questions after 5pm please refer to AMION to reach the Neurologist on call

## 2022-03-25 NOTE — Progress Notes (Signed)
Patient seen and examined. Family at bedside and discussed plan with him and nurse.  Seems to be having reasonable recovery so far.  Continue supportive care, neuro checks. Will follow while in icu.  Erskine Emery md PCCM

## 2022-03-25 NOTE — Progress Notes (Signed)
Physical Therapy  RE-EVALUATION Patient Details Name: Tara [name] MRN: [id number] DOB: [birth year] Today's Date: 03/25/2022   History of Present Illness 76 yo woman admitted 03/15/22 with new onset seizures related to meningioma. s/p tumor resection 7/3. Recent DC from [facility] 6/22. PMH: CKD stage 3, HFpEF, a fib,  meningioma.    PT Comments    Pt now s/p meningioma resection on 7/3. Pt with L eye swollen shut with bruising and continues to keep eyes closed as she did prior to surgery. Pt wouldn't attempt to open eyes today despite max verbal cues from son, PT, and RN. PT totalAX2 for mobility however was able to maintain EOB balance with close min guard for 5 min. Pt did engage in bilat LE exercises at EOB but did not with UEs. Acute PT to cont to work with patient to progress mobility. Recommend AIR upon d/c to maximize functional recovery for safe transition home with spouse.     Recommendations for follow up therapy are one component of a multi-disciplinary discharge planning process, led by the attending physician.  Recommendations may be updated based on patient status, additional functional criteria and insurance authorization.  Follow Up Recommendations  Acute inpatient rehab (3hours/day) Can patient physically be transported by private vehicle: Yes   Assistance Recommended at Discharge Frequent or constant Supervision/Assistance  Patient can return home with the following A lot of help with walking and/or transfers;A lot of help with bathing/dressing/bathroom;Assistance with cooking/housework;Assistance with feeding;Direct supervision/assist for medications management;Direct supervision/assist for financial management;Assist for transportation;Help with stairs or ramp for entrance   Equipment Recommendations  Wheelchair (measurements PT);Wheelchair cushion (measurements PT)    Recommendations for Other Services       Precautions / Restrictions Precautions Precautions:  Fall Precaution Comments: LUE edema Restrictions Weight Bearing Restrictions: No     Mobility  Bed Mobility Overal bed mobility: Needs Assistance Bed Mobility: Supine to Sit, Sit to Supine     Supine to sit: Max assist Sit to supine: +2 for physical assistance, Total assist   General bed mobility comments: HOB elevated, PT able to helicopter pt to Eob using bed pad, pt with no voluntary effort despite max verbal and tactile cues, PT and RN returned pt to supine and slide pt up to [facility] with totalAX2, pt eyes remained closed the entire time    Transfers                   General transfer comment: deferred due to lack of engagement and active participation    Ambulation/Gait                   Stairs             Wheelchair Mobility    Modified Rankin (Stroke Patients Only) Modified Rankin (Stroke Patients Only) Pre-Morbid Rankin Score: Moderately severe disability Modified Rankin: Severe disability     Balance Overall balance assessment: Needs assistance Sitting-balance support: No upper extremity supported, Feet unsupported Sitting balance-Leahy Scale: Fair Sitting balance - Comments: with max verbal cues pt able to maintain EOB balance without posterior assist. PT placed bilat hands in supportive position on EOB, pt with mild posterior lean however never lost balance                                    Cognition Arousal/Alertness: Lethargic Behavior During Therapy: Flat affect Overall Cognitive Status: Impaired/Different from baseline Area of  Impairment: Following commands                       Following Commands: Follows one step commands with increased time, Follows one step commands inconsistently       General Comments: eyes closed but awake. answering all question appropriately, no active eye opening despite max commands, pt followed simple commands 50% of time, suspect some inability to follow is due to lack of  desire ie. opening eyes, moving LEs to EOB to sit EOB        Exercises General Exercises - Lower Extremity Ankle Circles/Pumps: AROM, Both, 10 reps, Seated Long Arc Quad: AROM, Both, Seated, 10 reps (with max verbal and tactile cues, 5 at a time)    General Comments General comments (skin integrity, edema, etc.): VSS on RA, Left eye swollen shut with bruising, noted drainage from surgical incision, covered by dressing. Pt with n/v earlier in day but did not have any during session      Pertinent Vitals/Pain Pain Assessment Pain Assessment: No/denies pain    Home Living                          Prior Function            PT Goals (current goals can now be found in the care plan section) Acute Rehab PT Goals Patient Stated Goal: home PT Goal Formulation: With patient/family Time For Goal Achievement: 04/08/22 Potential to Achieve Goals: Good Progress towards PT goals: Goals downgraded-see care plan    Frequency    Min 4X/week      PT Plan Frequency needs to be updated;Discharge plan needs to be updated    Co-evaluation              AM-PAC PT "6 Clicks" Mobility   Outcome Measure  Help needed turning from your back to your side while in a flat bed without using bedrails?: Total Help needed moving from lying on your back to sitting on the side of a flat bed without using bedrails?: Total Help needed moving to and from a bed to a chair (including a wheelchair)?: Total Help needed standing up from a chair using your arms (e.g., wheelchair or bedside chair)?: Total Help needed to walk in hospital room?: Total Help needed climbing 3-5 steps with a railing? : Total 6 Click Score: 6    End of Session Equipment Utilized During Treatment: Gait belt Activity Tolerance: Patient limited by lethargy Patient left: in chair;with call bell/phone within reach;with chair alarm set;with family/visitor present Nurse Communication: Mobility status PT Visit  Diagnosis: Unsteadiness on feet (R26.81);Muscle weakness (generalized) (M62.81);Other symptoms and signs involving the nervous system (R29.898);Other abnormalities of gait and mobility (R26.89);Difficulty in walking, not elsewhere classified (R26.2)     Time: 6389-3734 PT Time Calculation (min) (ACUTE ONLY): 21 min  Charges:                        Kittie Plater, PT, DPT Acute Rehabilitation Services Secure chat preferred Office #: [phone]    Berline Lopes 03/25/2022, 12:53 PM

## 2022-03-25 NOTE — Progress Notes (Signed)
PT Cancellation Note  Patient Details Name: Tara [name] MRN: [id number] DOB: [birth year]   Cancelled Treatment:    Reason Eval/Treat Not Completed: Active bedrest order; will follow for when activity orders upgraded.    [name] 03/25/2022, 9:09 AM Magda Kiel, PT Acute Rehabilitation Services AGTXM:[phone] Office:[phone] 03/25/2022

## 2022-03-25 NOTE — Progress Notes (Signed)
PROGRESS NOTE        PATIENT DETAILS Name: Tara [name] Age: 76 y.o. Sex: female Date of Birth: 1945/10/28 Admit Date: 03/15/2022 Admitting Physician Ashok Pall, MD NWG:[id number], Norwood Levo, MD  Brief Summary: Patient is a 76 y.o.  female with a history of HTN, HLD, CKD stage IIIb, chronic HFpEF, meningioma, breast CA-s/p right lumpectomy and chemoradiation in 2008, depression/anxiety who was hospitalized from 6/8-6/22 for AKI/A-fib RVR with hypotension-presented to [facility] with new onset seizures.    Significant events: 6/08-6/22>> hospitalization for weakness-AKI-A-fib RVR with hypotension-meningioma-subsequently discharged home after stabilization.   6/24>> presented to [facility] ED with seizure-transferred to [facility].  Significant studies: 6/08>> CT head: Interval increase in size of previously noted planum sphenoidal meningioma. 6/08>> CT C-spine: No fracture or dislocation 6/09>> Echo: EF 55% 6/12>> TSH: 0.429 6/13>> CXR: No PNA 6/24>> CT head: No acute process-stable sphenoidal meningioma 2.4 x 3.3 x 2.8 cm-hypodense lesion is seen in the frontal lobe medially to the adjacent mass-representing edema/infarct. 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 6/28>> Keppra level: 37.1 (normal limits)  Significant microbiology data: 6/08>> influenza/COVID PCR: Negative 6/13>> blood culture: Negative  Procedures: None  Consults: Neurology Neurosurgery  Subjective: Patient in bed, appears comfortable, denies any headache, no fever, no chest pain or pressure, no shortness of breath , no abdominal pain. No focal weakness.   Objective: Vitals: Blood pressure 138/70, pulse 80, temperature 97.6 F (36.4 C), temperature source Oral, resp. rate 15, height '4\' 9"'$  (1.448  m), weight 85 kg, SpO2 97 %.   Exam:  Awake Alert, No new F.N deficits, left periorbital hematoma, scalp surgical site under bandage [place].AT,PERRAL Supple Neck, No JVD,   Symmetrical Chest wall movement, Good air movement bilaterally, CTAB RRR,No Gallops, Rubs or new Murmurs,  +ve B.Sounds, Abd Soft, No tenderness,   No Cyanosis, Clubbing or edema     Assessment/Plan:  New onset seizure: No further seizures-on Keppra/Vimpat.  Due to concern for sedation/lethargy/difficulty ambulation-Keppra dose reduced to 500 mg p.o. twice daily on 6/28.  She is relatively awake and alert-and moving all 4 extremities.   Meningioma: She remains on IV Decadron.  In by neurosurgery underwent craniotomy with mass resection on 03/24/2022, has expected left periorbital hematoma, her heparin drip was held from 10 PM 03/23/2022, to be resumed without bolus on 03/26/2022 per neurosurgery Case discussed with Dr. Christella Noa.  Postop in neuro ICU Dr. Christella Noa wants neuro ICU team to follow her they have been informed as well on 03/25/2022 morning.  PAF: Maintaining sinus rhythm-continue amiodarone-Eliquis stopped on 6/24 in anticipation of craniotomy-currently on IV heparin.  Kindly see above.  Acute toxic metabolic encephalopathy: Likely due to Keppra-much better after adjustment of dose on 6/28-see above  Chronic HFpEF EF 55% on echocardiogram done last month: Volume status stable  HTN: BP stable without use of any antihypertensives  HLD: Continue Crestor  CKD stage IIIb: At baseline-watch closely.  Normocytic anemia: Due to CKD-hemoglobin stable-we will transfuse if significant drop.  Leukocytosis: No indication of infection-likely due to steroid use.  Depression/anxiety: Continue Zoloft/Klonopin  GERD: Continue PPI  Back pain: Likely musculoskeletal in etiology-MRI of the lumbosacral spine without any significant pathology.    Acute on  chronic debility/deconditioning: Appears weak/debilitated-family refused SNF  last admit and took her home-suspect she would most benefit from going to subacute rehab on discharge-spouse aware of recommendations.  Prediabetes (A1c 6.3 on 6/8)-with steroid-induced hyperglycemia: Monitor CBGs on SSI.  Recent Labs    03/24/22 1627 03/24/22 2140 03/25/22 0754  GLUCAP 160* 179* 166*    Morbid Obesity: Estimated body mass index is 40.55 kg/m as calculated from the following:   Height as of this encounter: '4\' 9"'$  (1.448 m).   Weight as of this encounter: 85 kg.    Code status:   Code Status: Full Code   DVT Prophylaxis: heparin injection 5,000 Units Start: 03/24/22 2200 SCDs Start: 03/24/22 2004 Place TED hose Start: 03/24/22 0616 Place and maintain sequential compression device Start: 03/23/22 0631 SCDs Start: 03/15/22 0454 IV heparin    Family Communication: Spouse at bedside 03/23/22, 03/24/2022, 03/25/2022  Disposition Plan: Status is: Inpatient Remains inpatient appropriate because: New onset seizure-craniotomy on Monday-will require several more days of hospitalization.   Planned Discharge Destination:Skilled nursing facility   Diet: Diet Order             DIET DYS 2 Room service appropriate? Yes; Fluid consistency: Thin  Diet effective now                     Antimicrobial agents: Anti-infectives (From admission, onward)    Start     Dose/Rate Route Frequency Ordered Stop   03/25/22 0600  ceFAZolin (ANCEF) IVPB 2g/100 mL premix        2 g 200 mL/hr over 30 Minutes Intravenous On call to O.R. 03/24/22 1215 03/24/22 1440        MEDICATIONS: Scheduled Meds:  amiodarone  200 mg Oral Daily   Chlorhexidine Gluconate Cloth  6 each Topical Daily   clonazePAM  0.5 mg Oral QHS   dexamethasone  4 mg Oral Q8H   heparin injection (subcutaneous)  5,000 Units Subcutaneous Q8H   insulin aspart  0-5 Units Subcutaneous QHS   insulin aspart  0-9 Units Subcutaneous TID WC   lacosamide  200 mg Oral BID   montelukast  10 mg Oral QHS    pantoprazole  40 mg Oral Daily   rosuvastatin  20 mg Oral Daily   senna  1 tablet Oral BID   sertraline  25 mg Oral Daily   Continuous Infusions:  0.9 % NaCl with KCl 20 mEq / L 50 mL/hr at 03/25/22 8250   levETIRAcetam 500 mg (03/25/22 0922)   valproate sodium 425 mg (03/25/22 0738)   PRN Meds:.acetaminophen **OR** acetaminophen, antiseptic oral rinse, guaiFENesin, hydrALAZINE, HYDROcodone-acetaminophen, labetalol, metoprolol tartrate, morphine injection, naLOXone (NARCAN)  injection, ondansetron **OR** ondansetron (ZOFRAN) IV, oxyCODONE, senna-docusate, sodium phosphate   I have personally reviewed following labs and imaging studies  LABORATORY DATA:  Recent Labs  Lab 03/20/22 0451 03/22/22 0315 03/23/22 0248 03/24/22 0412 03/24/22 1516 03/24/22 1631 03/25/22 0512  WBC 12.5* 12.7* 13.1* 10.9*  --   --  14.5*  HGB 8.1* 8.1* 8.1* 8.5* 6.8* 9.9* 10.5*  HCT 24.5* 24.3* 25.3* 26.5* 20.0* 29.0* 31.7*  PLT 143* 135* 151 146*  --   --  101*  MCV 85.7 85.3 85.8 87.2  --   --  84.8  MCH 28.3 28.4 27.5 28.0  --   --  28.1  MCHC 33.1 33.3 32.0 32.1  --   --  33.1  RDW 15.7* 15.6* 15.2 15.4  --   --  17.1*  LYMPHSABS 0.4*  --   --   --   --   --   --  MONOABS 0.5  --   --   --   --   --   --   EOSABS 0.0  --   --   --   --   --   --   BASOSABS 0.0  --   --   --   --   --   --     Recent Labs  Lab 03/20/22 0451 03/22/22 0315 03/23/22 0248 03/24/22 0412 03/24/22 1516 03/24/22 1631 03/25/22 0512 03/25/22 0517  NA 133* 135 133* 134* 130* 134* 138  --   K 4.1 4.2 3.8 4.1 3.9 4.5 4.6  --   CL 105 101 101 98  --   --  106  --   CO2 '22 23 23 22  '$ --   --  20*  --   GLUCOSE 128* 153* 175* 199*  --   --  186*  --   BUN 32* 32* 29* 32*  --   --  27*  --   CREATININE 1.24* 1.05* 1.10* 1.24*  --   --  1.05*  --   CALCIUM 8.3* 8.3* 8.0* 8.3*  --   --  7.6*  --   AST 17  --   --   --   --   --   --   --   ALT 36  --   --   --   --   --   --   --   ALKPHOS 43  --   --   --   --   --    --   --   BILITOT 0.6  --   --   --   --   --   --   --   ALBUMIN 2.6*  --   --   --   --   --   --   --   MG  --   --   --  2.3  --   --  2.1  --   BNP  --   --   --  41.5  --   --   --  55.8     LOS: 10 days   Signature  Lala Lund M.D on 03/25/2022 at 10:42 AM   -  To page go to [email or web address]

## 2022-03-26 ENCOUNTER — Inpatient Hospital Stay (HOSPITAL_COMMUNITY): Payer: Medicare Other

## 2022-03-26 DIAGNOSIS — I482 Chronic atrial fibrillation, unspecified: Secondary | ICD-10-CM | POA: Diagnosis not present

## 2022-03-26 DIAGNOSIS — D696 Thrombocytopenia, unspecified: Secondary | ICD-10-CM

## 2022-03-26 DIAGNOSIS — I10 Essential (primary) hypertension: Secondary | ICD-10-CM | POA: Diagnosis not present

## 2022-03-26 DIAGNOSIS — D329 Benign neoplasm of meninges, unspecified: Secondary | ICD-10-CM | POA: Diagnosis not present

## 2022-03-26 DIAGNOSIS — E440 Moderate protein-calorie malnutrition: Secondary | ICD-10-CM | POA: Diagnosis not present

## 2022-03-26 DIAGNOSIS — R569 Unspecified convulsions: Secondary | ICD-10-CM | POA: Diagnosis not present

## 2022-03-26 LAB — CBC
HCT: 28.3 % — ABNORMAL LOW (ref 36.0–46.0)
Hemoglobin: 9.3 g/dL — ABNORMAL LOW (ref 12.0–15.0)
MCH: 28.3 pg (ref 26.0–34.0)
MCHC: 32.9 g/dL (ref 30.0–36.0)
MCV: 86 fL (ref 80.0–100.0)
Platelets: 66 10*3/uL — ABNORMAL LOW (ref 150–400)
RBC: 3.29 MIL/uL — ABNORMAL LOW (ref 3.87–5.11)
RDW: 17.3 % — ABNORMAL HIGH (ref 11.5–15.5)
WBC: 11.9 10*3/uL — ABNORMAL HIGH (ref 4.0–10.5)
nRBC: 0 % (ref 0.0–0.2)

## 2022-03-26 LAB — MAGNESIUM: Magnesium: 2.1 mg/dL (ref 1.7–2.4)

## 2022-03-26 LAB — BASIC METABOLIC PANEL
Anion gap: 10 (ref 5–15)
BUN: 22 mg/dL (ref 8–23)
CO2: 21 mmol/L — ABNORMAL LOW (ref 22–32)
Calcium: 7.7 mg/dL — ABNORMAL LOW (ref 8.9–10.3)
Chloride: 108 mmol/L (ref 98–111)
Creatinine, Ser: 1.02 mg/dL — ABNORMAL HIGH (ref 0.44–1.00)
GFR, Estimated: 57 mL/min — ABNORMAL LOW (ref >60)
Glucose, Bld: 227 mg/dL — ABNORMAL HIGH (ref 70–99)
Potassium: 4.1 mmol/L (ref 3.5–5.1)
Sodium: 139 mmol/L (ref 135–145)

## 2022-03-26 LAB — GLUCOSE, CAPILLARY
Glucose-Capillary: 200 mg/dL — ABNORMAL HIGH (ref 70–99)
Glucose-Capillary: 221 mg/dL — ABNORMAL HIGH (ref 70–99)
Glucose-Capillary: 200 mg/dL — ABNORMAL HIGH (ref 70–99)
Glucose-Capillary: 186 mg/dL — ABNORMAL HIGH (ref 70–99)

## 2022-03-26 LAB — BRAIN NATRIURETIC PEPTIDE: B Natriuretic Peptide: 20.5 pg/mL (ref 0.0–100.0)

## 2022-03-26 MED ORDER — LEVETIRACETAM 500 MG PO TABS: 500.0000 mg | ORAL_TABLET | Freq: Two times a day (BID) | ORAL | Status: DC

## 2022-03-26 MED ORDER — DEXAMETHASONE 0.5 MG PO TABS: 2.0000 mg | ORAL_TABLET | Freq: Two times a day (BID) | ORAL | Status: DC

## 2022-03-26 MED ORDER — LEVETIRACETAM IN NACL 500 MG/100ML IV SOLN
500.0000 mg | Freq: Two times a day (BID) | INTRAVENOUS | Status: DC
  Administered 2022-03-26 – 2022-03-28 (×5): 500 mg via INTRAVENOUS
  Filled 2022-03-26 (×5): qty 100

## 2022-03-26 MED ORDER — SODIUM CHLORIDE 0.9 % IV SOLN
200.0000 mg | Freq: Two times a day (BID) | INTRAVENOUS | Status: DC
  Administered 2022-03-26 – 2022-03-28 (×5): 200 mg via INTRAVENOUS
  Filled 2022-03-26 (×7): qty 20

## 2022-03-26 MED ORDER — DEXAMETHASONE SODIUM PHOSPHATE 4 MG/ML IJ SOLN
4.0000 mg | Freq: Two times a day (BID) | INTRAMUSCULAR | Status: DC
  Administered 2022-03-26 – 2022-03-30 (×9): 4 mg via INTRAVENOUS
  Filled 2022-03-26 (×10): qty 1

## 2022-03-26 MED ORDER — CARVEDILOL 3.125 MG PO TABS
3.1250 mg | ORAL_TABLET | Freq: Two times a day (BID) | ORAL | Status: DC
  Filled 2022-03-26: qty 1

## 2022-03-26 MED ORDER — DEXAMETHASONE 4 MG PO TABS
4.0000 mg | ORAL_TABLET | Freq: Two times a day (BID) | ORAL | Status: DC
  Administered 2022-03-26: 4 mg via ORAL
  Filled 2022-03-26: qty 1

## 2022-03-26 MED ORDER — DEXAMETHASONE 4 MG PO TABS: 4.0000 mg | ORAL_TABLET | Freq: Three times a day (TID) | ORAL | Status: DC

## 2022-03-26 NOTE — Progress Notes (Addendum)
PROGRESS NOTE        PATIENT DETAILS Name: Tara [name] Age: 76 y.o. Sex: female Date of Birth: Jul 12, 1946 Admit Date: 03/15/2022 Admitting Physician Ashok Pall, MD [id number], Norwood Levo, MD  Brief Summary: Patient is a 76 y.o.  female with a history of HTN, HLD, CKD stage IIIb, chronic HFpEF, meningioma, breast CA-s/p right lumpectomy and chemoradiation in 2008, depression/anxiety who was hospitalized from 6/8-6/22 for AKI/A-fib RVR with hypotension-presented to [facility] ED with new onset seizures.    Significant events: 6/08-6/22>> hospitalization for weakness-AKI-A-fib RVR with hypotension-meningioma-subsequently discharged home after stabilization.   6/24>> presented to [facility] ED with seizure-transferred to [facility].  Significant studies: 6/08>> CT head: Interval increase in size of previously noted planum sphenoidal meningioma. 6/08>> CT C-spine: No fracture or dislocation 6/09>> Echo: EF 55% 6/12>> TSH: 0.429 6/13>> CXR: No PNA 6/24>> CT head: No acute process-stable sphenoidal meningioma 2.4 x 3.3 x 2.8 cm-hypodense lesion is seen in the frontal lobe medially to the adjacent mass-representing edema/infarct. 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 6/28>> Keppra level: 37.1 (normal limits)  Significant microbiology data: 6/08>> influenza/COVID PCR: Negative 6/13>> blood culture: Negative  Procedures: None  Consults: Neurology Neurosurgery  Subjective: Patient in bed, appears comfortable, denies any headache, no fever, no chest pain or pressure, no shortness of breath , no abdominal pain. No new focal weakness.  Objective: Vitals: Blood pressure (!) 155/68, pulse 78, temperature 97.8 F (36.6 C), temperature source Axillary, resp. rate 17, height 4'  9" (1.448 m), weight 85 kg, SpO2 94 %.   Exam:  Awake Alert, No new F.N deficits, large left periorbital hematoma, scalp surgical site under bandage [place].AT,PERRAL Supple Neck, No JVD,   Symmetrical Chest wall movement, Good air movement bilaterally, CTAB RRR,No Gallops, Rubs or new Murmurs,  +ve B.Sounds, Abd Soft, No tenderness,   No Cyanosis, Clubbing or edema    Assessment/Plan:  New onset seizure: No further seizures-on Keppra/Vimpat.  Due to concern for sedation/lethargy/difficulty ambulation-Keppra dose reduced to 500 mg p.o. twice daily on 6/28.  She is relatively awake and alert-and moving all 4 extremities.   Meningioma: She was seen by neurosurgery underwent craniotomy with mass resection on 03/24/2022, has expected left periorbital hematoma, her heparin drip was held from 10 PM 03/23/2022, to be resumed without bolus on 03/26/2022 per neurosurgery Case discussed with Dr. Christella Noa.  No ever had left periorbital hematoma has grown significantly larger on 03/26/2022 will withhold all heparin for another day on 03/26/2022 and continue SCDs for now.  She was also on high doses of Decadron preop which I will start tapering on 03/26/2022.  Postop in neuro ICU Dr. Christella Noa wants neuro ICU team to follow her they have been informed as well on 03/25/2022 morning.   PAF: Maintaining sinus rhythm-continue amiodarone-Eliquis stopped on 6/24 in anticipation of craniotomy-currently on IV heparin.  Kindly see above.  Thrombocytopenia.  Likely due to valproic acid, platelet level 66,000, hold heparin and monitor.    Acute toxic metabolic encephalopathy: Likely due to Keppra-much better after adjustment of dose on 6/28-see above  Chronic HFpEF EF 55% on echocardiogram done last month: Volume status stable  HTN: BP stable will add low-dose Coreg and monitor.  Tapering high-dose steroids.  HLD: Continue Crestor  CKD stage IIIb: At baseline-watch closely.  Normocytic anemia: Due to CKD-hemoglobin stable-we  will transfuse if significant drop.  Leukocytosis: No indication of infection-likely due to steroid use.  Depression/anxiety: Continue Zoloft/Klonopin  GERD: Continue PPI  Back pain: Likely musculoskeletal in etiology-MRI of the lumbosacral spine without any significant pathology.    Acute on chronic debility/deconditioning: Appears weak/debilitated-family refused SNF last admit and took her home-suspect she would most benefit from going to subacute rehab on discharge-spouse aware of recommendations.  Prediabetes (A1c 6.3 on 6/8)-with steroid-induced hyperglycemia: Monitor CBGs on SSI.  Recent Labs    03/25/22 1701 03/25/22 2141 03/26/22 0758  GLUCAP 176* 174* 200*    Morbid Obesity: Estimated body mass index is 40.55 kg/m as calculated from the following:   Height as of this encounter: '4\' 9"'$  (1.448 m).   Weight as of this encounter: 85 kg.    Code status:   Code Status: Full Code   DVT Prophylaxis: SCDs Start: 03/26/22 0840 Place TED hose Start: 03/24/22 0616 Place and maintain sequential compression device Start: 03/23/22 0631 IV heparin    Family Communication: Spouse at bedside 03/23/22, 03/24/2022, 03/25/2022  Disposition Plan: Status is: Inpatient Remains inpatient appropriate because: New onset seizure-craniotomy on Monday-will require several more days of hospitalization.   Planned Discharge Destination:Skilled nursing facility   Diet: Diet Order             DIET DYS 2 Room service appropriate? Yes; Fluid consistency: Thin  Diet effective now                     Antimicrobial agents: Anti-infectives (From admission, onward)    Start     Dose/Rate Route Frequency Ordered Stop   03/25/22 0600  ceFAZolin (ANCEF) IVPB 2g/100 mL premix        2 g 200 mL/hr over 30 Minutes Intravenous On call to O.R. 03/24/22 1215 03/24/22 1440        MEDICATIONS: Scheduled Meds:  amiodarone  200 mg Oral Daily   Chlorhexidine Gluconate Cloth  6 each Topical  Daily   clonazePAM  0.5 mg Oral QHS   dexamethasone  4 mg Oral Q8H   insulin aspart  0-5 Units Subcutaneous QHS   insulin aspart  0-9 Units Subcutaneous TID WC   lacosamide  200 mg Oral BID   montelukast  10 mg Oral QHS   pantoprazole  40 mg Oral Daily   rosuvastatin  20 mg Oral Daily   senna  1 tablet Oral BID   sertraline  25 mg Oral Daily   Continuous Infusions:  levETIRAcetam Stopped (03/25/22 2109)   valproate sodium Stopped (03/26/22 0631)   PRN Meds:.acetaminophen **OR** acetaminophen, antiseptic oral rinse, guaiFENesin, hydrALAZINE, HYDROcodone-acetaminophen, labetalol, metoprolol tartrate, morphine injection, naLOXone (NARCAN)  injection, ondansetron **OR** ondansetron (ZOFRAN) IV, oxyCODONE, senna-docusate, sodium phosphate   I have personally reviewed following labs and imaging studies  LABORATORY DATA:  Recent Labs  Lab 03/20/22 0451 03/22/22 0315 03/23/22 0248 03/24/22 0412 03/24/22 1516 03/24/22 1631 03/25/22 0512 03/26/22 0529  WBC 12.5* 12.7* 13.1* 10.9*  --   --  14.5* 11.9*  HGB 8.1* 8.1* 8.1* 8.5* 6.8* 9.9* 10.5* 9.3*  HCT 24.5* 24.3* 25.3* 26.5* 20.0* 29.0* 31.7* 28.3*  PLT 143* 135* 151 146*  --   --  101* 66*  MCV 85.7 85.3 85.8 87.2  --   --  84.8 86.0  MCH 28.3 28.4 27.5 28.0  --   --  28.1 28.3  MCHC 33.1 33.3 32.0 32.1  --   --  33.1 32.9  RDW 15.7* 15.6* 15.2 15.4  --   --  17.1* 17.3*  LYMPHSABS 0.4*  --   --   --   --   --   --   --   MONOABS 0.5  --   --   --   --   --   --   --   EOSABS 0.0  --   --   --   --   --   --   --   BASOSABS 0.0  --   --   --   --   --   --   --     Recent Labs  Lab 03/20/22 0451 03/22/22 0315 03/23/22 0248 03/24/22 0412 03/24/22 1516 03/24/22 1631 03/25/22 0512 03/25/22 0517 03/26/22 0529  NA 133* 135 133* 134* 130* 134* 138  --  139  K 4.1 4.2 3.8 4.1 3.9 4.5 4.6  --  4.1  CL 105 101 101 98  --   --  106  --  108  CO2 '22 23 23 22  '$ --   --  20*  --  21*  GLUCOSE 128* 153* 175* 199*  --   --  186*   --  227*  BUN 32* 32* 29* 32*  --   --  27*  --  22  CREATININE 1.24* 1.05* 1.10* 1.24*  --   --  1.05*  --  1.02*  CALCIUM 8.3* 8.3* 8.0* 8.3*  --   --  7.6*  --  7.7*  AST 17  --   --   --   --   --   --   --   --   ALT 36  --   --   --   --   --   --   --   --   ALKPHOS 43  --   --   --   --   --   --   --   --   BILITOT 0.6  --   --   --   --   --   --   --   --   ALBUMIN 2.6*  --   --   --   --   --   --   --   --   MG  --   --   --  2.3  --   --  2.1  --  2.1  BNP  --   --   --  41.5  --   --   --  55.8 20.5     LOS: 11 days   Signature  Lala Lund M.D on 03/26/2022 at 8:40 AM   -  To page go to [email or web address]

## 2022-03-26 NOTE — Consult Note (Signed)
NAME:  Tara [name], MRN:  [id number], DOB:  1946-09-20, LOS: 58 ADMISSION DATE:  03/15/2022, CONSULTATION DATE:  03/26/22 REFERRING MD:  Christella Noa, CHIEF COMPLAINT:  Meningioma, Seizure   History of Present Illness:  Tara [name] is a 76 y.o. F with PMH significant for Anxiety, Depression, HTN, HL, cardiomyopathy, Atrial Fibrillation on Eliquis, CKD IIIb, breast Ca s/p chemoradiation in 2008 who presented to the ED 6/24 with new onset seizures.   She had a known planum sphenoidal meningioma that was increased in size on CT with surrounding edema, so was transferred to [facility] for further evaluation.   LTM showed no further seizures.  She was evaluated by neurosurgery and underwent bifrontal craniotomy for meningioma resection on 7/3 and was transferred to intensive care post-op with PCCM consult while in ICU.    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   Significant Hospital Events: Including procedures, antibiotic start and stop dates in addition to other pertinent events   6/24 presented to [facility] ED with new onset seizures, CTH with enlarging hematoma, loaded with Keppra 7/3 Underwent bifrontal craniotomy for meningioma, monitor in ICU 7/5 stable, no further seizure activity  Interim History / Subjective:  No overnight events, awake on RA, fatigued, no neuro changes -4L since admission  Objective   Blood pressure  (!) 145/66, pulse 76, temperature 98.3 F (36.8 C), temperature source Oral, resp. rate 18, height '4\' 9"'$  (1.448 m), weight 85 kg, SpO2 96 %.        Intake/Output Summary (Last 24 hours) at 03/26/2022 0745 Last data filed at 03/26/2022 0600 Gross per 24 hour  Intake 1057.17 ml  Output 725 ml  Net 332.17 ml   Filed Weights   03/15/22 0130  Weight: 85 kg   General:  ill-appearing elderly F, sleeping but arousable to voice  HEENT: MM pink/moist, sclera anicteric, L peri-orbital ecchymosis, diffuse facial edema Neuro: fatigued but responds slowly to commands and is moving all extremities, no noted facial droop  CV: s1s2 rrr, no m/r/g PULM:  clear bilaterally on RA  GI: soft, non-distended, non-tender Extremities: warm/dry, 1+ pre-tibial edema  Skin: no rashes or lesions   Labs reviewed Glu 200 Creatinine 1.02 CBC 11.9 Hgb 9.3 Platelets 66  Resolved Hospital Problem list     Assessment & Plan:    Sphenoidal Meningioma, enlarging with new vasogenic edema and new onset seizures Post-op day #2 s/p bifrontal craniotomy  -close neuro monitoring in ICU, post-op plan per Neurosurgery -Loaded with Keppra, neurology following, no further seizure activity on EEG, on Vimpat and Depakote  -seizure precautions with prn benzodiazepines -speech consult -on decadron '4mg'$  q8hrs, TRH following and plan to taper   Thrombocytopenia  Acute on chronic -platelets 66 today, down from 146 two days ago, possibly secondary to acute illness -heparin was resumed post-op, held today, no evidence of significant bleeding -continue to closely monitor    HTN HFpEF, chronic Atrial Fibrillation Echo 6/9 with EF 55% and Grade I diastolic dysfunction No RVR this morning -continue amiodarone '200mg'$  po and prn beta blockers  Best Practice (right click and "Reselect all SmartList Selections" daily)   Diet/type: dysphagia diet (see orders) DVT prophylaxis: SCD GI prophylaxis: N/A Lines:  Central line Foley:  N/A Code Status:  full code Last date of multidisciplinary goals of care discussion [per primary, husband at bedside]  Labs   CBC: Recent Labs  Lab 03/20/22 0451 03/22/22 0315 03/23/22 0248 03/24/22 0412 03/24/22 1516 03/24/22 1631 03/25/22 0512 03/26/22 0529  WBC 12.5* 12.7* 13.1* 10.9*  --   --  14.5* 11.9*  NEUTROABS 11.5*  --   --   --   --   --   --   --   HGB 8.1* 8.1* 8.1* 8.5* 6.8* 9.9* 10.5* 9.3*  HCT 24.5* 24.3* 25.3* 26.5* 20.0* 29.0* 31.7* 28.3*  MCV 85.7 85.3 85.8 87.2  --   --  84.8 86.0  PLT 143* 135* 151 146*  --   --  101* 66*    Basic Metabolic Panel: Recent Labs  Lab 03/22/22 0315 03/23/22 0248 03/24/22 0412 03/24/22 1516 03/24/22 1631 03/25/22 0512 03/26/22 0529  NA 135 133* 134* 130* 134* 138 139  K 4.2 3.8 4.1 3.9 4.5 4.6 4.1  CL 101 101 98  --   --  106 108  CO2 '23 23 22  '$ --   --  20* 21*  GLUCOSE 153* 175* 199*  --   --  186* 227*  BUN 32* 29* 32*  --   --  27* 22  CREATININE 1.05* 1.10* 1.24*  --   --  1.05* 1.02*  CALCIUM 8.3* 8.0* 8.3*  --   --  7.6* 7.7*  MG  --   --  2.3  --   --  2.1 2.1   GFR: Estimated Creatinine Clearance: 42.4 mL/min (A) (by C-G formula based on SCr of 1.02 mg/dL (H)). Recent Labs  Lab 03/23/22 0248 03/24/22 0412 03/25/22 0512 03/26/22 0529  WBC 13.1* 10.9* 14.5* 11.9*    Liver Function Tests: Recent Labs  Lab 03/20/22 0451  AST 17  ALT 36  ALKPHOS 43  BILITOT 0.6  PROT 5.2*  ALBUMIN 2.6*   No results for input(s): "LIPASE", "AMYLASE" in the last 168 hours. No results for input(s): "AMMONIA" in the last 168 hours.  ABG    Component Value Date/Time   PHART 7.359 03/24/2022 1631   PCO2ART 40.6 03/24/2022 1631   PO2ART 236 (H) 03/24/2022 1631   HCO3 23.4 03/24/2022 1631   TCO2 25 03/24/2022 1631   ACIDBASEDEF 3.0 (H) 03/24/2022 1631   O2SAT 100 03/24/2022 1631     Coagulation Profile: No results for input(s): "INR", "PROTIME" in the last 168 hours.  Cardiac  Enzymes: No results for input(s): "CKTOTAL", "CKMB", "CKMBINDEX", "TROPONINI" in the last 168 hours.  HbA1C: Hgb A1c MFr Bld  Date/Time Value Ref Range Status  02/27/2022 04:59 PM 6.3 (H) 4.8 - 5.6 % Final    Comment:    (NOTE) Pre diabetes:          5.7%-6.4%  Diabetes:              >6.4%  Glycemic control for   <7.0% adults with diabetes   01/12/2018 12:35 PM 6.0 (H) <5.7 % of total Hgb Final    Comment:    For someone without known diabetes, a hemoglobin  A1c value between 5.7% and 6.4% is consistent with prediabetes and should be confirmed with a  follow-up test. . For someone with known diabetes, a value <7% indicates that their  diabetes is well controlled. A1c targets should be individualized based on duration of diabetes, age, comorbid conditions, and other considerations. . This assay result is consistent with an increased risk of diabetes. . Currently, no consensus exists regarding use of hemoglobin A1c for diagnosis of diabetes for children. .     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   Surgical History:   Past Surgical History:  Procedure Laterality Date   ABDOMINAL  HYSTERECTOMY  1994   fibroids,    BIOPSY  06/19/2016   Procedure: BIOPSY;  Surgeon: Daneil Dolin, MD;  [facility];  Service: Endoscopy;;  gastric duodenum   BREAST BIOPSY Right 12/16/2006   malignant   BREAST EXCISIONAL BIOPSY Right 2018   benign lumpectomy   BREAST LUMPECTOMY Right 02/2007   BREAST LUMPECTOMY WITH RADIOACTIVE SEED LOCALIZATION Right 07/29/2017   Procedure: RIGHT BREAST LUMPECTOMY WITH RADIOACTIVE SEED LOCALIZATION;  Surgeon: Fanny Skates, MD;  Location: [place];  Service: General;  Laterality: Right;   BREAST SURGERY Right 2008   lumpectomy, cancer   CARDIAC CATHETERIZATION     CHOLECYSTECTOMY  1999   COLONOSCOPY  2008   Dr. Oneida Alar: multiple polyps. Path not available at time of visit.    COLONOSCOPY WITH PROPOFOL N/A 04/25/2014   Dr. Oneida Alar: Multiple tubular adenomas removed. Diverticulosis. Moderate internal hemorrhoids. Next colonoscopy planned for August 2018.   ESOPHAGOGASTRODUODENOSCOPY (EGD) WITH PROPOFOL N/A 06/19/2016   Procedure: ESOPHAGOGASTRODUODENOSCOPY (EGD) WITH PROPOFOL;  Surgeon: Daneil Dolin, MD;  [facility];  Service: Endoscopy;  Laterality: N/A;   MASTECTOMY W/ SENTINEL NODE BIOPSY Right 06/09/2018   Procedure: RIGHT TOTAL MASTECTOMY WITH SENTINEL LYMPH NODE BIOPSY;  Surgeon: Fanny Skates, MD;  Location: [facility];  Service: General;  Laterality: Right;   POLYPECTOMY N/A 04/25/2014   Procedure: POLYPECTOMY;  Surgeon: Danie Binder, MD;  Location: [facility];  Service: Endoscopy;  Laterality: N/A;  Ascending and Decending Colon x3 , Transverse colon x2, rectal   PORTACATH PLACEMENT N/A 06/09/2018   Procedure: INSERTION PORT-A-CATH;  Surgeon: Fanny Skates, MD;  Location: [place];  Service: General;  Laterality: N/A;   RIGHT/LEFT HEART CATH AND CORONARY ANGIOGRAPHY N/A 07/10/2017   Procedure: RIGHT/LEFT HEART CATH AND CORONARY ANGIOGRAPHY;  Surgeon: Larey Dresser, MD;  Location: [facility];  Service: Cardiovascular;   Laterality: N/A;     Social History:   reports that she has never smoked. She has never used smokeless tobacco. She reports that she does not drink alcohol and does not use drugs.   Family History:  Her family history includes Cancer in her cousin, cousin, paternal aunt, and sister; Cancer (age of onset: 52) in her brother; Colon cancer (age of onset: 79) in her father; Dementia in her mother; Heart disease in her sister; Hypertension in her sister; Lung cancer in her paternal uncle; Other in  her paternal aunt, paternal uncle, and paternal uncle; Prostate cancer in her cousin; Stroke (age of onset: 58) in her mother.   Allergies Allergies  Allergen Reactions   Aspirin Other (See Comments)    Reports GI bleed--pt is currently taking   Lisinopril Cough   Sudafed [Pseudoephedrine Hcl]     Pt reports she had drainage in throat that made her throat hurt.      Home Medications  Prior to Admission medications   Medication Sig Start Date End Date Taking? Authorizing Provider  acetaminophen (TYLENOL) 500 MG tablet Take 500-1,000 mg by mouth every 6 (six) hours as needed (FOR PAIN.).   Yes [provider]  amiodarone (PACERONE) 200 MG tablet Take 1 tablet (200 mg total) by mouth daily. 03/13/22  Yes Thurnell Lose, MD  apixaban (ELIQUIS) 2.5 MG TABS tablet Take 1 tablet (2.5 mg total) by mouth 2 (two) times daily. 03/12/22  Yes Thurnell Lose, MD  azelastine (ASTELIN) 0.1 % nasal spray Place 1 spray into both nostrils daily as needed for rhinitis. Use in each nostril as directed   Yes [provider]  Cholecalciferol (VITAMIN D) 50 MCG (2000 UT) CAPS Take 2,000 Units by mouth daily.   Yes [provider]  clonazePAM (KLONOPIN) 0.5 MG tablet Take 1 tablet (0.5 mg total) by mouth at bedtime. 01/20/22  Yes [name], MD  dexamethasone (DECADRON) 0.5 MG tablet Take 2 tabs for 3 days, then 1 tab for 3 days then stop. 03/13/22  Yes Thurnell Lose, MD  docusate  sodium (COLACE) 100 MG capsule Take 200 mg by mouth 2 (two) times daily as needed for mild constipation.   Yes [provider]  JARDIANCE 10 MG TABS tablet TAKE ONE TABLET BY MOUTH ONCE DAILY. Patient taking differently: Take 10 mg by mouth daily. 01/03/22  Yes Larey Dresser, MD  montelukast (SINGULAIR) 10 MG tablet TAKE ONE TABLET BY MOUTH AT BEDTIME. Patient taking differently: Take 10 mg by mouth at bedtime. 09/03/21  Yes [name], MD  pantoprazole (PROTONIX) 40 MG tablet TAKE (1) TABLET BY MOUTH TWICE DAILY. Patient taking differently: 40 mg 2 (two) times daily. 11/18/21  Yes [name], MD  Phenylephrine-Witch Hazel (PREPARATION H) 0.25-50 % GEL Apply 1 Application topically daily as needed (hemorrhoid).   Yes [provider]  rosuvastatin (CRESTOR) 20 MG tablet Take 1 tablet (20 mg total) by mouth daily. 01/14/22  Yes [name], MD  sertraline (ZOLOFT) 25 MG tablet Take 1 tablet (25 mg total) by mouth daily. 01/20/22  Yes [name], MD     Critical care time: n/a     Mickel Baas R Kortland Nichols, PA-C  Pulmonary & Critical care See Amion for pager If no response to pager , please call [phone] until 7pm After 7:00 pm call Elink  [phone]?[place]

## 2022-03-26 NOTE — Progress Notes (Signed)
Patient with fixed right gaze while working with therapy this AM. Unable to correct gaze with several attempts. Patient A&O, Dr Christella Noa aware and will go down for CT scan now that was already planned for today.

## 2022-03-26 NOTE — Evaluation (Signed)
Occupational Therapy Evaluation Patient Details Name: Tara [name] MRN: [id number] DOB: 18-Dec-1945 Today's Date: 03/26/2022   History of Present Illness 76 yo woman admitted 03/15/22 with new onset seizures related to meningioma. s/p tumor resection 7/3. Recent DC from [facility] 6/22. PMH: CKD stage 3, HFpEF, a fib,  meningioma.   Clinical Impression   Tara [name] was re-evaluated s/p the above surgery. She now requires significantly more assist with max -total A +2 needed for all aspects of her care at bed level and tolerated sitting EOB with total A support for ~10 minutes. OT care plan goals update. She was also observed to have a R lateral/superior fixed gaze throughout the entire session, unable to correct despite max efforts, RN and MD made aware. She will continue to benefit from OT acutely. Recommend CIR at this time in hopes pt will progress in activity tolerance.      Recommendations for follow up therapy are one component of a multi-disciplinary discharge planning process, led by the attending physician.  Recommendations may be updated based on patient status, additional functional criteria and insurance authorization.   Follow Up Recommendations  Acute inpatient rehab (3hours/day)    Assistance Recommended at Discharge Frequent or constant Supervision/Assistance  Patient can return home with the following Two people to help with walking and/or transfers;A lot of help with bathing/dressing/bathroom;Assistance with cooking/housework;Direct supervision/assist for medications management;Direct supervision/assist for financial management;Assist for transportation;Help with stairs or ramp for entrance    Functional Status Assessment  Patient has had a recent decline in their functional status and demonstrates the ability to make significant improvements in function in a reasonable and predictable amount of time.  Equipment Recommendations  None recommended by OT;Other (comment)     Recommendations for Other Services Rehab consult     Precautions / Restrictions Precautions Precautions: Fall Precaution Comments: LUE edema Restrictions Weight Bearing Restrictions: No      Mobility Bed Mobility Overal bed mobility: Needs Assistance Bed Mobility: Supine to Sit, Sit to Supine     Supine to sit: Total assist, +2 for physical assistance, +2 for safety/equipment Sit to supine: Total assist, +2 for physical assistance, +2 for safety/equipment   General bed mobility comments: no initiation by patient despite verbal and tactile cueing. TotalA+2 for all aspects of bed mobility    Transfers                   General transfer comment: deferred      Balance Overall balance assessment: Needs assistance Sitting-balance support: No upper extremity supported, Feet unsupported Sitting balance-Leahy Scale: Zero Sitting balance - Comments: totalA to maintain sitting balance on EOB with posterior support Postural control: Posterior lean, Left lateral lean                                 ADL either performed or assessed with clinical judgement   ADL Overall ADL's : Needs assistance/impaired Eating/Feeding: NPO   Grooming: Maximal assistance;Sitting   Upper Body Bathing: Maximal assistance;Sitting   Lower Body Bathing: Total assistance   Upper Body Dressing : Maximal assistance;Sitting   Lower Body Dressing: Total assistance   Toilet Transfer: Total assistance   Toileting- Clothing Manipulation and Hygiene: Total assistance       Functional mobility during ADLs: Total assistance General ADL Comments: Pt participating minimally, total A +2 to get to EOB. Pt with fized gaze.     Vision Baseline Vision/History: 1 Wears glasses  Vision Assessment?: Yes Eye Alignment: Impaired (comment) Ocular Range of Motion: Impaired-to be further tested in functional context Alignment/Gaze Preference: Gaze left Tracking/Visual Pursuits: Impaired -  to be further tested in functional context Additional Comments: Pt with fized R lateral/upward gaze. Unable to bring either eye our of position despite multiple efforts     Perception     Praxis      Pertinent Vitals/Pain Pain Assessment Pain Assessment: Faces Faces Pain Scale: No hurt     Hand Dominance Right   Extremity/Trunk Assessment Upper Extremity Assessment Upper Extremity Assessment: RUE deficits/detail;LUE deficits/detail;Generalized weakness RUE Deficits / Details: Full PROM, not a lot of attempt to participate in AAROM. Able to hold arm up against gravity for a few moments. hand extremely edematous. weak girp RUE Coordination: decreased fine motor;decreased gross motor LUE Deficits / Details: Full PROM, not a lot of attempt to participate in AAROM. Able to hold arm up against gravity for a few moments. hand extremely edematous. weak girp LUE Coordination: decreased gross motor;decreased fine motor   Lower Extremity Assessment Lower Extremity Assessment: Defer to PT evaluation   Cervical / Trunk Assessment Cervical / Trunk Assessment: Normal   Communication Communication Communication: No difficulties   Cognition Arousal/Alertness: Lethargic Behavior During Therapy: Flat affect Overall Cognitive Status: Impaired/Different from baseline Area of Impairment: Following commands, Attention                   Current Attention Level: Focused   Following Commands: Follows one step commands with increased time, Follows one step commands inconsistently Safety/Judgement: Decreased awareness of deficits, Decreased awareness of safety   Problem Solving: Slow processing, Decreased initiation, Difficulty sequencing, Requires verbal cues General Comments: lethargy making difficult to fully assess cognition. Patient with fixated R lateral/upward gaze. With max cueing, unable to bring eyes torward midline. Notified RN and MD.     General Comments  VSS on RA, L eye  swollen and bruised. eyes open with R gaze 100% of the session    Exercises     Shoulder Instructions      Home Living Family/patient expects to be discharged to:: Private residence Living Arrangements: Spouse/significant other Available Help at Discharge: Family;Available PRN/intermittently Type of Home: House Home Access: Ramped entrance Entrance Stairs-Number of Steps: 1 Entrance Stairs-Rails: Right;Left Home Layout: Two level;Laundry or work area in basement Alternate Therapist, sports of Steps: flight Alternate Level Stairs-Rails: Armed forces training and education officer: Programmer, systems: Yes   Home Equipment: Rollator (4 wheels);Grab bars - tub/shower;Rolling Walker (2 wheels);BSC/3in1;Wheelchair - manual;Hospital bed          Prior Functioning/Environment Prior Level of Function : Independent/Modified Independent;Needs assist       Physical Assist : Mobility (physical);ADLs (physical) Mobility (physical): Transfers;Gait ADLs (physical): Grooming;Bathing;Dressing;Toileting Mobility Comments: Husband providing assistance with all mobility, usually x1 HHA only with short gait bouts. Husband had been having pt ambulate every 2 hours after recent d/c. ADLs Comments: husband assisting with all ADL tasks with the exception of feeding; recent incontinence        OT Problem List: Decreased strength;Decreased activity tolerance;Impaired balance (sitting and/or standing);Obesity;Decreased cognition;Decreased safety awareness;Decreased knowledge of use of DME or AE      OT Treatment/Interventions: Self-care/ADL training;Therapeutic exercise;DME and/or AE instruction;Therapeutic activities;Balance training;Patient/family education    OT Goals(Current goals can be found in the care plan section) Acute Rehab OT Goals Patient Stated Goal: unable to state OT Goal Formulation: With patient/family Time For Goal Achievement: 04/12/22 ADL Goals Pt Will  Perform Grooming:  with min assist;sitting Pt Will Perform Upper Body Bathing: with min assist;sitting Pt Will Perform Lower Body Bathing: with mod assist;sit to/from stand Pt Will Transfer to Toilet: with mod assist;stand pivot transfer;bedside commode Additional ADL Goal #1: Pt will visually locate at least 3 objects in the room with minimal directional cues  OT Frequency: Min 2X/week    Co-evaluation PT/OT/SLP Co-Evaluation/Treatment: Yes Reason for Co-Treatment: Complexity of the patient's impairments (multi-system involvement) PT goals addressed during session: Mobility/safety with mobility;Balance;Strengthening/ROM OT goals addressed during session: ADL's and self-care      AM-PAC OT "6 Clicks" Daily Activity     Outcome Measure Help from another person eating meals?: Total Help from another person taking care of personal grooming?: A Lot Help from another person toileting, which includes using toliet, bedpan, or urinal?: Total Help from another person bathing (including washing, rinsing, drying)?: A Lot Help from another person to put on and taking off regular upper body clothing?: A Lot Help from another person to put on and taking off regular lower body clothing?: Total 6 Click Score: 9   End of Session Nurse Communication: Mobility status (R gaze, RN and MD aware)  Activity Tolerance: Patient tolerated treatment well Patient left: in bed;with call bell/phone within reach;with bed alarm set;with nursing/sitter in room;with family/visitor present  OT Visit Diagnosis: Unsteadiness on feet (R26.81);Other abnormalities of gait and mobility (R26.89);Muscle weakness (generalized) (M62.81);Other symptoms and signs involving the nervous system (R29.898);Other symptoms and signs involving cognitive function                Time: 1015-1036 OT Time Calculation (min): 21 min Charges:  OT General Charges $OT Visit: 1 Visit OT Evaluation $OT Eval Moderate Complexity: 1 Mod    Cherylee Rawlinson A  Jquan Egelston 03/26/2022, 1:46 PM

## 2022-03-26 NOTE — Progress Notes (Addendum)
Speech Language Pathology Treatment: Dysphagia  Patient Details Name: Tara [name] MRN: [id number] DOB: 05/25/1946 Today's Date: 03/26/2022 Time: 8588-5027 SLP Time Calculation (min) (ACUTE ONLY): 32 min  Assessment / Plan / Recommendation Clinical Impression  Pt appears weak this afternoon during dysphagia therapy with sisters present. This is first ST session since her surgery 7/3. Her vocal quality was wet upon SLP arrival and just took crushed meds with RN. Consumed approximately 5 separate trials of thin via straw without s/s aspiration but then began to cough consistently with the water. Her vocal quality is breathy and hypophonic at baseline since surgery. Nectar thick yielded swallows without cough, throat clear with continued wet vocal quality as volitional cough is weak. RN and sisters reported her mastication of Dys 2 texture is prolonged and does not appear functional at present. Pt did not wish to eat more during session. SLP downgraded and pt/sister's agree with Dys 1 (puree), nectar thick liquids, continue crush meds, full supervision and assist during meals. Will continue to intervene and perform instrumental assessment if cannot upgrade at bedside when appropriate.    HPI HPI: Pt is a 76 y/o who presented 6/24 with new onset seizures. 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 EEG negative for seizures. Neurosurgey consulted and did not recommend meningioma resection as of 6/27. Decline in swallow function noted overnight 6/27. Dx acute toxic metabolic encephalopathy suspected secondary to keppra. Pt s/p Bifrontal craniotomy for resection of meningioma 7/3. PMH: HTN, HLD, GERD, CKD stage IIIb, chronic HFpEF, meningioma, breast CA-s/p right lumpectomy and chemoradiation in 2008, depression/anxiety. BSE 03/11/22: regular texture diet and thin liquids without need for follow up.      SLP Plan  Continue with current plan of care       Recommendations for follow up therapy are one component of a multi-disciplinary discharge planning process, led by the attending physician.  Recommendations may be updated based on patient status, additional functional criteria and insurance authorization.    Recommendations  Diet recommendations: Dysphagia 1 (puree);Nectar-thick liquid Liquids provided via: Cup;Straw Medication Administration: Crushed with puree Supervision: Staff to assist with self feeding;Full supervision/cueing for compensatory strategies Compensations: Minimize environmental distractions;Slow rate;Small sips/bites;Lingual sweep for clearance of pocketing Postural Changes and/or Swallow Maneuvers: Seated upright 90 degrees                Oral Care Recommendations: Oral care BID Follow Up Recommendations: Acute inpatient rehab (3hours/day) Assistance recommended at discharge: Frequent or constant Supervision/Assistance SLP Visit Diagnosis: Dysphagia, oropharyngeal phase (R13.12) Plan: Continue with current plan of care           Houston Siren  03/26/2022, 2:30 PM

## 2022-03-26 NOTE — Progress Notes (Signed)
Inpatient Rehab Admissions Coordinator:   Pt continues to demonstrate limited participation and tolerance for CIR level therapies.  Will not pursue potential admission at this time.  Will defer to therapy team and TOC for alternative rehab venues to be explored.  Notified Ellan Lambert, RN CM.    Shann Medal, PT, DPT Admissions Coordinator [phone] 03/26/22  1:37 PM

## 2022-03-26 NOTE — Progress Notes (Signed)
Subjective: No acute events overnight.   ROS: negative except above  Examination  Vital signs in last 24 hours: Temp:  [97 F (36.1 C)-98.3 F (36.8 C)] 97.8 F (36.6 C) (07/05 0800) Pulse Rate:  [58-83] 78 (07/05 0800) Resp:  [17-24] 17 (07/05 0800) BP: (123-158)/(56-79) 155/68 (07/05 0800) SpO2:  [91 %-96 %] 94 % (07/05 0800)  General: lying in bed, left peri-orbital edema ( improving) Neuro: awake, alert, able to tell meher name, location, not time, CN 2-12 appear grossly intact, 4/5 in BL UE, 3/5 in BL LE  Basic Metabolic Panel: Recent Labs  Lab 03/22/22 0315 03/23/22 0248 03/24/22 0412 03/24/22 1516 03/24/22 1631 03/25/22 0512 03/26/22 0529  NA 135 133* 134* 130* 134* 138 139  K 4.2 3.8 4.1 3.9 4.5 4.6 4.1  CL 101 101 98  --   --  106 108  CO2 '23 23 22  '$ --   --  20* 21*  GLUCOSE 153* 175* 199*  --   --  186* 227*  BUN 32* 29* 32*  --   --  27* 22  CREATININE 1.05* 1.10* 1.24*  --   --  1.05* 1.02*  CALCIUM 8.3* 8.0* 8.3*  --   --  7.6* 7.7*  MG  --   --  2.3  --   --  2.1 2.1    CBC: Recent Labs  Lab 03/20/22 0451 03/22/22 0315 03/23/22 0248 03/24/22 0412 03/24/22 1516 03/24/22 1631 03/25/22 0512 03/26/22 0529  WBC 12.5* 12.7* 13.1* 10.9*  --   --  14.5* 11.9*  NEUTROABS 11.5*  --   --   --   --   --   --   --   HGB 8.1* 8.1* 8.1* 8.5* 6.8* 9.9* 10.5* 9.3*  HCT 24.5* 24.3* 25.3* 26.5* 20.0* 29.0* 31.7* 28.3*  MCV 85.7 85.3 85.8 87.2  --   --  84.8 86.0  PLT 143* 135* 151 146*  --   --  101* 66*     Coagulation Studies: No results for input(s): "LABPROT", "INR" in the last 72 hours.  Imaging No new brain imaging overnight   ASSESSMENT AND PLAN: 76 year old female with meningioma who presented with seizure.   Meningioma s/p resection Cerebral edema Epilepsy Thrombocytopenia -No further seizures overnight  Recommendations -Continue Keppra 500 mg twice daily, Vimpat 200 mg twice daily  -Patient's platelets have been steadily dropping over  the last couple of days.  This could be related to Depakote.  She remains seizure-free and now after resecting the tumor, the risk of seizure recurrence is lower.  Therefore we will discontinue Depakote. -If seizures recur, can increase Keppra to 750 mg twice daily -Continue seizure precautions -Discussed plan with patient, husband at bedside - Management of rest of comorbidities per primary team   I have spent a total of  37 minutes with the patient reviewing hospital notes,  test results, labs and examining the patient as well as establishing an assessment and plan that was discussed personally with the patient.  > 50% of time was spent in direct patient care.    Zeb Comfort Epilepsy [facility] For questions after 5pm please refer to AMION to reach the Neurologist on call

## 2022-03-26 NOTE — Progress Notes (Signed)
Physical Therapy Treatment Patient Details Name: Tara [name] MRN: [id number] DOB: 1945-11-14 Today's Date: 03/26/2022   History of Present Illness 76 yo woman admitted 03/15/22 with new onset seizures related to meningioma. s/p tumor resection 7/3. Recent DC from [facility] 6/22. PMH: CKD stage 3, HFpEF, a fib,  meningioma.    PT Comments    Patient seen in conjunction with OT to maximize patient's tolerance. Patient with no initiation for movement despite verbal and tactile cueing. Patient with fixated R lateral gaze and unable to  come to midline, notified RN and MD. Required totalA+2 for bed mobility this date and totalA to maintain sitting balance. Continue to recommend acute inpatient rehab (AIR) for post-acute therapy needs. Will continue to monitor and assess to determine if patient improves enough to tolerate intensive therapies.    Recommendations for follow up therapy are one component of a multi-disciplinary discharge planning process, led by the attending physician.  Recommendations may be updated based on patient status, additional functional criteria and insurance authorization.  Follow Up Recommendations  Acute inpatient rehab (3hours/day) Can patient physically be transported by private vehicle: No   Assistance Recommended at Discharge Frequent or constant Supervision/Assistance  Patient can return home with the following Two people to help with walking and/or transfers;Two people to help with bathing/dressing/bathroom;Assistance with cooking/housework;Direct supervision/assist for medications management;Help with stairs or ramp for entrance;Direct supervision/assist for financial management;Assist for transportation;Assistance with feeding   Equipment Recommendations  Wheelchair (measurements PT);Wheelchair cushion (measurements PT);Hospital bed    Recommendations for Other Services       Precautions / Restrictions Precautions Precautions: Fall Precaution Comments: LUE  edema Restrictions Weight Bearing Restrictions: No     Mobility  Bed Mobility Overal bed mobility: Needs Assistance Bed Mobility: Supine to Sit, Sit to Supine     Supine to sit: Total assist, +2 for physical assistance, +2 for safety/equipment Sit to supine: Total assist, +2 for physical assistance, +2 for safety/equipment   General bed mobility comments: no initiation by patient despite verbal and tactile cueing. TotalA+2 for all aspects of bed mobility    Transfers                   General transfer comment: deferred    Ambulation/Gait                   Stairs             Wheelchair Mobility    Modified Rankin (Stroke Patients Only) Modified Rankin (Stroke Patients Only) Pre-Morbid Rankin Score: Moderately severe disability Modified Rankin: Severe disability     Balance Overall balance assessment: Needs assistance Sitting-balance support: No upper extremity supported, Feet unsupported Sitting balance-Leahy Scale: Zero Sitting balance - Comments: totalA to maintain sitting balance on EOB with posterior support Postural control: Posterior lean, Left lateral lean                                  Cognition Arousal/Alertness: Lethargic Behavior During Therapy: Flat affect Overall Cognitive Status: Impaired/Different from baseline Area of Impairment: Following commands, Attention                   Current Attention Level: Focused   Following Commands: Follows one step commands with increased time, Follows one step commands inconsistently       General Comments: lethargy making difficult to fully assess cognition. Patient with fixated R lateral gaze. With max cueing,  unable to bring eyes torward midline. Notified RN and MD.        Exercises      General Comments        Pertinent Vitals/Pain Pain Assessment Pain Assessment: No/denies pain    Home Living                          Prior Function             PT Goals (current goals can now be found in the care plan section) Acute Rehab PT Goals Patient Stated Goal: did not state PT Goal Formulation: With patient/family Time For Goal Achievement: 04/08/22 Potential to Achieve Goals: Fair Progress towards PT goals: Not progressing toward goals - comment    Frequency    Min 4X/week      PT Plan Current plan remains appropriate    Co-evaluation PT/OT/SLP Co-Evaluation/Treatment: Yes Reason for Co-Treatment: For patient/therapist safety;To address functional/ADL transfers PT goals addressed during session: Mobility/safety with mobility;Balance;Strengthening/ROM        AM-PAC PT "6 Clicks" Mobility   Outcome Measure  Help needed turning from your back to your side while in a flat bed without using bedrails?: Total Help needed moving from lying on your back to sitting on the side of a flat bed without using bedrails?: Total Help needed moving to and from a bed to a chair (including a wheelchair)?: Total Help needed standing up from a chair using your arms (e.g., wheelchair or bedside chair)?: Total Help needed to walk in hospital room?: Total Help needed climbing 3-5 steps with a railing? : Total 6 Click Score: 6    End of Session   Activity Tolerance: Patient limited by lethargy Patient left: in bed;with call bell/phone within reach;with nursing/sitter in room;with family/visitor present Nurse Communication: Mobility status PT Visit Diagnosis: Unsteadiness on feet (R26.81);Muscle weakness (generalized) (M62.81);Other symptoms and signs involving the nervous system (R29.898);Other abnormalities of gait and mobility (R26.89);Difficulty in walking, not elsewhere classified (R26.2)     Time: 2751-7001 PT Time Calculation (min) (ACUTE ONLY): 22 min  Charges:  $Therapeutic Activity: 8-22 mins                     Tara [name] PT, DPT Acute Rehabilitation Services Office [phone]    Tara [name] 03/26/2022, 12:24 PM

## 2022-03-26 NOTE — Progress Notes (Signed)
eLink Physician-Brief Progress Note Patient Name: Tara [name] DOB: August 15, 1946 MRN: [id number]   Date of Service  03/26/2022  HPI/Events of Note  Patient lethargic - Not able to take PO medications.   eICU Interventions  Plan: Change Keppra, Vimpat and Decadron from PO to IV. Hold all other PO medications until enteric tube can be placed.      Intervention Category Major Interventions: Change in mental status - evaluation and management  Tara [name] 03/26/2022, 9:06 PM

## 2022-03-26 NOTE — Progress Notes (Signed)
Patient ID: Tara [name], female   DOB: 01/30/1946, 76 y.o.   MRN: [id number] BP (!) 155/68 (BP Location: Right Leg)   Pulse 78   Temp 97.8 F (36.6 C) (Axillary)   Resp 17   Ht '4\' 9"'$  (1.448 m)   Wt 85 kg   SpO2 94%   BMI 40.55 kg/m  Alert, will respond normally. Had fixed right gaze palsy Ct scan unremarkable post op  No oral movements However not as good as yesterday.  Will call neurology Thrombocytopenic this am, platelet count 66k Was 101 7/4. Have spoken with the pharmacist

## 2022-03-27 ENCOUNTER — Inpatient Hospital Stay (HOSPITAL_COMMUNITY): Payer: Medicare Other

## 2022-03-27 ENCOUNTER — Encounter (HOSPITAL_COMMUNITY): Payer: Self-pay | Admitting: Neurosurgery

## 2022-03-27 DIAGNOSIS — R569 Unspecified convulsions: Secondary | ICD-10-CM | POA: Diagnosis not present

## 2022-03-27 DIAGNOSIS — D320 Benign neoplasm of cerebral meninges: Secondary | ICD-10-CM | POA: Diagnosis not present

## 2022-03-27 DIAGNOSIS — I482 Chronic atrial fibrillation, unspecified: Secondary | ICD-10-CM | POA: Diagnosis not present

## 2022-03-27 DIAGNOSIS — E440 Moderate protein-calorie malnutrition: Secondary | ICD-10-CM | POA: Diagnosis not present

## 2022-03-27 LAB — GLUCOSE, CAPILLARY
Glucose-Capillary: 133 mg/dL — ABNORMAL HIGH (ref 70–99)
Glucose-Capillary: 138 mg/dL — ABNORMAL HIGH (ref 70–99)
Glucose-Capillary: 146 mg/dL — ABNORMAL HIGH (ref 70–99)
Glucose-Capillary: 148 mg/dL — ABNORMAL HIGH (ref 70–99)
Glucose-Capillary: 97 mg/dL (ref 70–99)

## 2022-03-27 LAB — CBC
HCT: 27.3 % — ABNORMAL LOW (ref 36.0–46.0)
Hemoglobin: 8.8 g/dL — ABNORMAL LOW (ref 12.0–15.0)
MCH: 28.3 pg (ref 26.0–34.0)
MCHC: 32.2 g/dL (ref 30.0–36.0)
MCV: 87.8 fL (ref 80.0–100.0)
Platelets: 66 10*3/uL — ABNORMAL LOW (ref 150–400)
RBC: 3.11 MIL/uL — ABNORMAL LOW (ref 3.87–5.11)
RDW: 17.5 % — ABNORMAL HIGH (ref 11.5–15.5)
WBC: 12 10*3/uL — ABNORMAL HIGH (ref 4.0–10.5)
nRBC: 0 % (ref 0.0–0.2)

## 2022-03-27 LAB — BASIC METABOLIC PANEL
Anion gap: 11 (ref 5–15)
BUN: 23 mg/dL (ref 8–23)
CO2: 20 mmol/L — ABNORMAL LOW (ref 22–32)
Calcium: 8.1 mg/dL — ABNORMAL LOW (ref 8.9–10.3)
Chloride: 107 mmol/L (ref 98–111)
Creatinine, Ser: 1.01 mg/dL — ABNORMAL HIGH (ref 0.44–1.00)
GFR, Estimated: 58 mL/min — ABNORMAL LOW (ref >60)
Glucose, Bld: 184 mg/dL — ABNORMAL HIGH (ref 70–99)
Potassium: 4 mmol/L (ref 3.5–5.1)
Sodium: 138 mmol/L (ref 135–145)

## 2022-03-27 LAB — URINALYSIS, ROUTINE W REFLEX MICROSCOPIC
Bilirubin Urine: NEGATIVE
Glucose, UA: >=500 mg/dL — AB
Ketones, ur: 5 mg/dL — AB
Leukocytes,Ua: NEGATIVE
Nitrite: NEGATIVE
Protein, ur: 100 mg/dL — AB
Specific Gravity, Urine: 1.022 (ref 1.005–1.030)
pH: 5 (ref 5.0–8.0)

## 2022-03-27 LAB — MAGNESIUM: Magnesium: 2.3 mg/dL (ref 1.7–2.4)

## 2022-03-27 LAB — BRAIN NATRIURETIC PEPTIDE: B Natriuretic Peptide: 42.8 pg/mL (ref 0.0–100.0)

## 2022-03-27 MED ORDER — LORAZEPAM 2 MG/ML IJ SOLN
2.0000 mg | INTRAMUSCULAR | Status: DC | PRN
  Administered 2022-03-29 – 2022-05-01 (×3): 2 mg via INTRAVENOUS
  Filled 2022-03-27 (×3): qty 1

## 2022-03-27 MED ORDER — SODIUM CHLORIDE 0.9 % IV SOLN
3.0000 g | Freq: Four times a day (QID) | INTRAVENOUS | Status: AC
  Administered 2022-03-27 – 2022-04-01 (×20): 3 g via INTRAVENOUS
  Filled 2022-03-27 (×20): qty 8

## 2022-03-27 MED ORDER — INSULIN ASPART 100 UNIT/ML IJ SOLN
0.0000 [IU] | INTRAMUSCULAR | Status: DC
  Administered 2022-03-27 (×3): 2 [IU] via SUBCUTANEOUS
  Administered 2022-03-28: 3 [IU] via SUBCUTANEOUS
  Administered 2022-03-28 – 2022-03-29 (×3): 5 [IU] via SUBCUTANEOUS
  Administered 2022-03-29 (×3): 3 [IU] via SUBCUTANEOUS
  Administered 2022-03-29 – 2022-03-30 (×2): 5 [IU] via SUBCUTANEOUS
  Administered 2022-03-30: 2 [IU] via SUBCUTANEOUS
  Administered 2022-03-30 – 2022-03-31 (×3): 3 [IU] via SUBCUTANEOUS
  Administered 2022-03-31 (×2): 2 [IU] via SUBCUTANEOUS
  Administered 2022-04-01 (×4): 3 [IU] via SUBCUTANEOUS
  Administered 2022-04-01 (×2): 2 [IU] via SUBCUTANEOUS
  Administered 2022-04-02 (×5): 3 [IU] via SUBCUTANEOUS
  Administered 2022-04-03: 8 [IU] via SUBCUTANEOUS
  Administered 2022-04-03: 2 [IU] via SUBCUTANEOUS
  Administered 2022-04-03: 5 [IU] via SUBCUTANEOUS
  Administered 2022-04-03 – 2022-04-04 (×3): 2 [IU] via SUBCUTANEOUS
  Administered 2022-04-04 (×2): 3 [IU] via SUBCUTANEOUS
  Administered 2022-04-05: 2 [IU] via SUBCUTANEOUS
  Administered 2022-04-05: 3 [IU] via SUBCUTANEOUS
  Administered 2022-04-05 – 2022-04-06 (×3): 2 [IU] via SUBCUTANEOUS
  Administered 2022-04-06: 3 [IU] via SUBCUTANEOUS
  Administered 2022-04-06: 2 [IU] via SUBCUTANEOUS
  Administered 2022-04-06: 1 [IU] via SUBCUTANEOUS
  Administered 2022-04-07 – 2022-04-08 (×7): 2 [IU] via SUBCUTANEOUS
  Administered 2022-04-08: 3 [IU] via SUBCUTANEOUS
  Administered 2022-04-09 – 2022-04-10 (×6): 2 [IU] via SUBCUTANEOUS
  Administered 2022-04-10 – 2022-04-11 (×3): 3 [IU] via SUBCUTANEOUS
  Administered 2022-04-11: 2 [IU] via SUBCUTANEOUS
  Administered 2022-04-11 – 2022-04-12 (×2): 3 [IU] via SUBCUTANEOUS
  Administered 2022-04-12 – 2022-04-17 (×7): 2 [IU] via SUBCUTANEOUS
  Administered 2022-04-17: 3 [IU] via SUBCUTANEOUS

## 2022-03-27 MED ORDER — LACTATED RINGERS IV SOLN: INTRAVENOUS | Status: DC

## 2022-03-27 NOTE — Progress Notes (Signed)
SLP Cancellation Note  Patient Details Name: Tara [name] MRN: [id number] DOB: 11/23/1945   Cancelled treatment:       Reason Eval/Treat Not Completed: Medical issues which prohibited therapy. Chart reviewed and discussed with RN. Pt had emesis with concern for aspiration overnight. Per RN, today she has changes to mentation that are currently being w/u and now with copious secretions. Medical team has made her NPO. Will hold SLP for today and f/u as able.    Osie Bond., M.A. [place] Office [phone]  Secure chat preferred  03/27/2022, 9:06 AM

## 2022-03-27 NOTE — Progress Notes (Signed)
EEG Tech placed MRI conditional leads on patient

## 2022-03-27 NOTE — Progress Notes (Signed)
Subjective: Has had intermittent episodes of gaze deviation.  Also changes in mental status.  Per husband patient was speaking with him about an hour (around 10 AM) but only answering in monosyllables.  ROS: negative except above  Examination  Vital signs in last 24 hours: Temp:  [97.6 F (36.4 C)-98.9 F (37.2 C)] 98.1 F (36.7 C) (07/06 0800) Pulse Rate:  [65-84] 73 (07/06 1000) Resp:  [13-32] 13 (07/06 1000) BP: (111-167)/(62-97) 138/74 (07/06 1000) SpO2:  [93 %-96 %] 93 % (07/06 1000)  General: lying in bed, left peri-orbital edema ( improving) Neuro: Opens eyes to repeated tactile stimulation, does not follow commands, unable to tell me her name, has left upward gaze preference, does move her eyes around but does not track examiner, PERRLA, blinks intermittently, antigravity strength in right upper extremity, does not withdraw to noxious stimuli in left upper and bilateral lower extremity  Basic Metabolic Panel: Recent Labs  Lab 03/23/22 0248 03/24/22 0412 03/24/22 1516 03/24/22 1631 03/25/22 0512 03/26/22 0529 03/27/22 0558  NA 133* 134* 130* 134* 138 139 138  K 3.8 4.1 3.9 4.5 4.6 4.1 4.0  CL 101 98  --   --  106 108 107  CO2 23 22  --   --  20* 21* 20*  GLUCOSE 175* 199*  --   --  186* 227* 184*  BUN 29* 32*  --   --  27* 22 23  CREATININE 1.10* 1.24*  --   --  1.05* 1.02* 1.01*  CALCIUM 8.0* 8.3*  --   --  7.6* 7.7* 8.1*  MG  --  2.3  --   --  2.1 2.1 2.3    CBC: Recent Labs  Lab 03/23/22 0248 03/24/22 0412 03/24/22 1516 03/24/22 1631 03/25/22 0512 03/26/22 0529 03/27/22 0558  WBC 13.1* 10.9*  --   --  14.5* 11.9* 12.0*  HGB 8.1* 8.5* 6.8* 9.9* 10.5* 9.3* 8.8*  HCT 25.3* 26.5* 20.0* 29.0* 31.7* 28.3* 27.3*  MCV 85.8 87.2  --   --  84.8 86.0 87.8  PLT 151 146*  --   --  101* 66* 66*     Coagulation Studies: No results for input(s): "LABPROT", "INR" in the last 72 hours.  Imaging No new brain imaging overnight   ASSESSMENT AND PLAN: 76 year old  female with meningioma who presented with seizure.   Meningioma s/p resection Cerebral edema Epilepsy Thrombocytopenia Acute encephalopathy -Intermittent episodes of gaze deviation could be due to seizures versus encephalopathy -Unclear etiology of encephalopathy.  Could be due to medications, worsening hemorrhage, delirium  Recommendations -Stat CT head to look for any worsening edema, hemorrhage -Video EEG for evaluation of intermittent gaze deviation -Continue Keppra 500 mg twice daily, Vimpat 200 mg twice daily  -If seizures recur, can increase Keppra to 750 mg twice daily -Continue seizure precautions -Discussed plan with patient, husband at bedside - Management of rest of comorbidities per primary team   I have spent a total of  35 minutes with the patient reviewing hospital notes,  test results, labs and examining the patient as well as establishing an assessment and plan that was discussed personally with the patient's husband at bedside.  > 50% of time was spent in direct patient care.  Zeb Comfort Epilepsy [facility] For questions after 5pm please refer to AMION to reach the Neurologist on call

## 2022-03-27 NOTE — Progress Notes (Signed)
NAME:  Tara [name], MRN:  [id number], DOB:  03/27/1946, LOS: 12 ADMISSION DATE:  03/15/2022, CONSULTATION DATE:  03/27/22 REFERRING MD:  Christella Noa, CHIEF COMPLAINT:  Meningioma, Seizure   History of Present Illness:  Tara [name] is a 76 y.o. F with PMH significant for Anxiety, Depression, T2DM, HTN, HL, cardiomyopathy, Atrial Fibrillation on Eliquis, CKD IIIb, breast Ca s/p chemoradiation in 2008 who presented to the ED 6/24 with new onset seizures.   She had a known planum sphenoidal meningioma that was increased in size on CT with surrounding edema, so was transferred to [facility] for further evaluation.   LTM showed no further seizures.  She was evaluated by neurosurgery and underwent bifrontal craniotomy for meningioma resection on 7/3 and was transferred to intensive care post-op with PCCM consult while in ICU.    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   Significant Hospital Events: Including procedures, antibiotic start and stop dates in addition to other pertinent events   6/24 presented to [facility] with new onset seizures, CTH with enlarging hematoma, loaded with Keppra 7/3 Underwent bifrontal craniotomy for meningioma, monitor in ICU 7/5 stable, no further seizure activity 7/6 less responsive, two episodes of vomiting overnight and increased oral secretions today.  Continuous blinking on exam. CTH yesterday with  new 7 mm thick subdural hematoma at the foramen magnum and tracking adjacent to the basion and odontoid not currently causing critical stenosis of the foramen magnum per radiology read, but merits surveillance.  Interim History / Subjective:  Overnight pt was more lethargic with increased oral secretions, now NPO.  Had two episodes of vomiting, concerning for aspiration though CXR clear.  Has intermittently deviated gaze to the L and continuous blinking.   Objective   Blood pressure (!) 146/62, pulse 72, temperature 98.1 F (36.7 C), temperature source Axillary, resp. rate 18, height '4\' 9"'$  (1.448 m), weight 85 kg, SpO2 94 %.        Intake/Output Summary (Last 24 hours) at 03/27/2022 0737 Last data filed at 03/27/2022 0600 Gross per 24 hour  Intake 893.29 ml  Output 925 ml  Net -31.71 ml    Filed Weights   03/15/22 0130  Weight: 85 kg   General:  ill, lethargic appearing elderly F, no distress HEENT: MM pink/moist, sclera anicteric, stable L peri-orbital hematoma  Neuro: sleeping, arouses slowly to voice, opens eyes, initially had L gaze deviation, improved and tracking on reassessment, no facial droop, continuous eye blinking, not following commands or answering questions CV: s1s2 tachycardic, no m/r/g PULM:  decreased air entry bilateral bases without rhonchi or wheezing, on room air, coughing occasionally and requiring secretion suctioning  GI: soft, non-distended Extremities: warm/dry,  1+ pre-tibial edema  Skin: no rashes or lesions   Labs reviewed Glu 148 Creatinine 1.01 CBC 12 Hgb 8.8 Platelets 66  Resolved Hospital Problem list     Assessment & Plan:    Sphenoidal Meningioma, enlarging with new vasogenic edema and new onset seizures Post-op day #3 s/p bifrontal craniotomy  -close neuro monitoring in ICU, post-op plan per Neurosurgery -[facility] yesterday with  subdural at the foramen magnum, not currently causing critical stenosis, more lethargic today, check EEG with  continuous blinking -NPO -start Unasyn for possible aspiration -Loaded with Keppra, neurology following, no further seizure activity on EEG, on Vimpat, Neurology discontinued Depakote yesterday in the setting of thrombocytopenia  -seizure precautions with prn benzodiazepines -speech consult -on decadron '4mg'$  q12, tapered from q8hrs yesterday   Thrombocytopenia  Acute on chronic -platelets stabilized to 66,  -continue to hold heparin -follow CBC   HTN HFpEF, chronic Atrial Fibrillation Echo 6/9 with EF 55% and Grade I diastolic dysfunction No RVR this morning -holding Eliquis -holding po medications in the setting of AMS -may need cortrak for po medications   Type 2 DM -holding home Jardiance -SSI, increase to moderate -goal glucose 140-180   Labs reviewed Glu 184 Creatinine 1.01 BUN 23 WBC 12   Best Practice (right click and "Reselect all SmartList Selections" daily)   Diet/type: NPO DVT prophylaxis: SCD GI prophylaxis: N/A Lines: Central line Foley:  N/A Code Status:  full code Last date of multidisciplinary goals of care discussion [per primary, husband at bedside updated 7/6]  Labs   CBC: Recent Labs  Lab 03/23/22 0248 03/24/22 0412 03/24/22 1516 03/24/22 1631 03/25/22 0512 03/26/22 0529 03/27/22 0558  WBC 13.1* 10.9*  --   --  14.5* 11.9* 12.0*  HGB 8.1* 8.5* 6.8* 9.9* 10.5* 9.3* 8.8*  HCT 25.3* 26.5* 20.0* 29.0* 31.7* 28.3* 27.3*  MCV 85.8 87.2  --   --  84.8 86.0 87.8  PLT 151 146*  --   --  101* 66* 66*     Basic Metabolic Panel: Recent Labs  Lab 03/23/22 0248 03/24/22 0412 03/24/22 1516 03/24/22 1631 03/25/22 0512 03/26/22 0529 03/27/22 0558  NA 133* 134* 130* 134* 138 139 138  K 3.8 4.1 3.9 4.5 4.6 4.1 4.0  CL 101 98  --   --  106 108 107  CO2 23 22  --   --  20* 21* 20*  GLUCOSE 175* 199*  --   --  186* 227* 184*  BUN 29* 32*  --   --  27* 22 23  CREATININE 1.10* 1.24*  --   --  1.05* 1.02* 1.01*  CALCIUM 8.0* 8.3*  --   --   7.6* 7.7* 8.1*  MG  --  2.3  --   --  2.1 2.1 2.3    GFR: Estimated Creatinine Clearance: 42.8 mL/min (A) (by C-G formula based on SCr of 1.01 mg/dL (H)). Recent Labs  Lab 03/24/22 0412 03/25/22 0512 03/26/22 0529 03/27/22 0558  WBC 10.9* 14.5* 11.9* 12.0*     Liver Function Tests: No results for input(s): "AST", "ALT", "ALKPHOS", "BILITOT", "PROT", "ALBUMIN" in the last 168 hours.  No results for input(s): "LIPASE", "AMYLASE" in the last 168 hours. No results for input(s): "AMMONIA" in the last 168 hours.  ABG    Component Value Date/Time   PHART 7.359 03/24/2022 1631   PCO2ART 40.6 03/24/2022 1631   PO2ART 236 (H) 03/24/2022 1631   HCO3 23.4 03/24/2022 1631   TCO2 25 03/24/2022 1631   ACIDBASEDEF 3.0 (H) 03/24/2022 1631   O2SAT 100 03/24/2022 1631     Coagulation Profile: No results for input(s): "INR", "PROTIME" in the last 168 hours.  Cardiac Enzymes: No results for input(s): "CKTOTAL", "CKMB", "CKMBINDEX", "TROPONINI" in the last 168 hours.  HbA1C: Hgb A1c MFr Bld  Date/Time Value Ref Range Status  02/27/2022 04:59 PM 6.3 (H) 4.8 - 5.6 % Final  Comment:    (NOTE) Pre diabetes:          5.7%-6.4%  Diabetes:              >6.4%  Glycemic control for   <7.0% adults with diabetes   01/12/2018 12:35 PM 6.0 (H) <5.7 % of total Hgb Final    Comment:    For someone without known diabetes, a hemoglobin  A1c value between 5.7% and 6.4% is consistent with prediabetes and should be confirmed with a  follow-up test. . For someone with known diabetes, a value <7% indicates that their diabetes is well controlled. A1c targets should be individualized based on duration of diabetes, age, comorbid conditions, and other considerations. . This assay result is consistent with an increased risk of diabetes. . Currently, no consensus exists regarding use of hemoglobin A1c for diagnosis of diabetes for children. .     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   Surgical History:   Past Surgical History:  Procedure Laterality Date   ABDOMINAL HYSTERECTOMY  1994   fibroids,    BIOPSY  06/19/2016   Procedure: BIOPSY;  Surgeon: Daneil Dolin, MD;  [facility];  Service: Endoscopy;;  gastric duodenum   BREAST BIOPSY Right 12/16/2006   malignant   BREAST EXCISIONAL BIOPSY Right 2018   benign lumpectomy   BREAST LUMPECTOMY Right 02/2007   BREAST LUMPECTOMY WITH RADIOACTIVE SEED LOCALIZATION Right 07/29/2017   Procedure: RIGHT BREAST LUMPECTOMY WITH RADIOACTIVE SEED LOCALIZATION;  Surgeon: Fanny Skates, MD;  Location: [place];  Service: General;  Laterality: Right;   BREAST SURGERY Right 2008   lumpectomy, cancer   CARDIAC CATHETERIZATION     CHOLECYSTECTOMY  1999   COLONOSCOPY  2008   Dr. Oneida Alar: multiple polyps. Path not available at time of visit.    COLONOSCOPY WITH PROPOFOL N/A 04/25/2014   Dr. Oneida Alar: Multiple tubular adenomas removed. Diverticulosis. Moderate internal hemorrhoids. Next colonoscopy planned for August 2018.   CRANIOTOMY Bilateral 03/24/2022   Procedure: Bifrontal craniotomy for resection of meningioma;  Surgeon: Ashok Pall, MD;   Location: [place];  Service: Neurosurgery;  Laterality: Bilateral;   ESOPHAGOGASTRODUODENOSCOPY (EGD) WITH PROPOFOL N/A 06/19/2016   Procedure: ESOPHAGOGASTRODUODENOSCOPY (EGD) WITH PROPOFOL;  Surgeon: Daneil Dolin, MD;  [facility];  Service: Endoscopy;  Laterality: N/A;   MASTECTOMY W/ SENTINEL NODE BIOPSY Right 06/09/2018   Procedure: RIGHT TOTAL MASTECTOMY WITH SENTINEL LYMPH NODE BIOPSY;  Surgeon: Fanny Skates, MD;  Location: [place];  Service: General;  Laterality: Right;   POLYPECTOMY N/A 04/25/2014   Procedure: POLYPECTOMY;  Surgeon: Danie Binder, MD;  Location: [facility];  Service: Endoscopy;  Laterality: N/A;  Ascending and Decending Colon x3 , Transverse colon x2, rectal   PORTACATH PLACEMENT N/A 06/09/2018   Procedure: INSERTION PORT-A-CATH;  Surgeon: Fanny Skates, MD;  Location: [place];  Service: General;  Laterality: N/A;   RIGHT/LEFT HEART CATH AND CORONARY ANGIOGRAPHY N/A 07/10/2017   Procedure: RIGHT/LEFT HEART CATH AND CORONARY ANGIOGRAPHY;  Surgeon: Larey Dresser, MD;  Location: [place] [facility];  Service: Cardiovascular;  Laterality: N/A;     Social History:   reports that she has never smoked. She has never used smokeless tobacco. She reports that she does not drink alcohol and does not use drugs.   Family History:  Her family history includes Cancer in her cousin, cousin, paternal aunt, and sister; Cancer (age of onset: 1) in her brother; Colon cancer (age of onset: 73) in her father; Dementia in her mother; Heart disease in her sister; Hypertension in her sister; Lung cancer in her paternal uncle; Other in her paternal aunt, paternal uncle, and paternal uncle; Prostate cancer in her cousin; Stroke (age of onset: 67) in her mother.   Allergies Allergies  Allergen Reactions   Aspirin Other (See Comments)    Reports GI bleed--pt is currently taking   Lisinopril Cough   Sudafed [Pseudoephedrine Hcl]     Pt reports she had drainage in throat that made her  throat hurt.      Home Medications  Prior to Admission medications   Medication Sig Start Date End Date Taking? Authorizing Provider  acetaminophen (TYLENOL) 500 MG tablet Take 500-1,000 mg by mouth every 6 (six) hours as needed (FOR PAIN.).   Yes [provider]  amiodarone (PACERONE) 200 MG tablet Take 1 tablet (200 mg total) by mouth daily. 03/13/22  Yes Thurnell Lose, MD  apixaban (ELIQUIS) 2.5 MG TABS tablet Take 1 tablet (2.5 mg total) by mouth 2 (two) times daily. 03/12/22  Yes Thurnell Lose, MD  azelastine (ASTELIN) 0.1 % nasal spray Place 1 spray into both nostrils daily as needed for rhinitis. Use in each nostril as directed   Yes [provider]  Cholecalciferol (VITAMIN D) 50 MCG (2000 UT) CAPS Take 2,000 Units by mouth daily.   Yes [provider]  clonazePAM (KLONOPIN) 0.5 MG tablet Take 1 tablet (0.5 mg total) by mouth at bedtime. 01/20/22  Yes [name], MD  dexamethasone (DECADRON) 0.5 MG tablet Take 2 tabs for 3 days, then 1 tab for 3 days then stop. 03/13/22  Yes Thurnell Lose, MD  docusate sodium (COLACE) 100 MG capsule Take 200 mg by mouth 2 (two) times daily as needed for mild constipation.   Yes [provider]  JARDIANCE 10 MG TABS tablet TAKE ONE TABLET BY MOUTH ONCE DAILY. Patient taking differently: Take 10 mg by mouth daily. 01/03/22  Yes Larey Dresser, MD  montelukast (SINGULAIR) 10 MG tablet TAKE ONE TABLET BY MOUTH AT BEDTIME. Patient taking differently: Take 10 mg by mouth at bedtime. 09/03/21  Yes [name], MD  pantoprazole (PROTONIX) 40 MG tablet TAKE (1) TABLET BY MOUTH TWICE DAILY. Patient taking differently: 40 mg 2 (two) times daily. 11/18/21  Yes [name], MD  Phenylephrine-[place]el (PREPARATION H) 0.25-50 % GEL Apply 1 Application topically daily as needed (hemorrhoid).   Yes [provider]  rosuvastatin (CRESTOR) 20 MG tablet Take 1 tablet (20 mg total) by mouth  daily. 01/14/22  Yes [name], MD  sertraline (ZOLOFT) 25 MG tablet Take 1 tablet (25 mg total) by mouth daily. 01/20/22  Yes [name], MD     Critical care time: 40 minutes     CRITICAL CARE Performed by: Otilio Carpen Nahlia Hellmann  Total critical care time: 40 minutes  Critical care time was exclusive of separately billable procedures and treating other patients.  Critical care was necessary to treat or prevent imminent or life-threatening deterioration.  Critical care was time spent personally by me on the following activities: development of treatment plan with patient and/or surrogate as well as nursing, discussions with consultants, evaluation of patient's response to treatment, examination of patient, obtaining history from patient or surrogate, ordering and performing treatments and interventions, ordering and review of laboratory studies, ordering and review of radiographic studies, pulse oximetry and re-evaluation of patient's condition.   Otilio Carpen Breia Ocampo, PA-C [facility] See Amion for pager If no response to pager , please call [phone] until 7pm After 7:00 pm call Elink  [phone]

## 2022-03-27 NOTE — Progress Notes (Signed)
LTM EEG hooked up and running - no initial skin breakdown -Pt has a left side frontal resection scar and bandage. MRI conditional electrodes were placed. push button tested - neuro notified. [facility] monitoring.

## 2022-03-27 NOTE — Progress Notes (Addendum)
eLink Physician-Brief Progress Note Patient Name: Tara [name] DOB: April 02, 1946 MRN: [id number]   Date of Service  03/27/2022  HPI/Events of Note  Episode of vomiting - Question of aspiration. Sat = 94% on room air. RR = 18.  eICU Interventions  Plan: Portable CXR in AM.      Intervention Category Major Interventions: Other:  Stacye Noori Cornelia Copa 03/27/2022, 12:12 AM

## 2022-03-27 NOTE — Progress Notes (Signed)
PT Cancellation Note  Patient Details Name: Tara [name] MRN: [id number] DOB: [birth year]   Cancelled Treatment:    Reason Eval/Treat Not Completed: Patient not medically ready. RN requesting hold on PT this date as pt has had a neurological decline. Will plan to follow-up another day as able.   Moishe Spice, PT, DPT Acute Rehabilitation Services  Office: [place] 03/27/2022, 12:15 PM

## 2022-03-27 NOTE — Addendum Note (Signed)
Addendum  created 03/27/22 1923 by Audry Pili, MD   Attestation recorded in [place], [place] filed, Dance movement psychotherapist edited

## 2022-03-27 NOTE — Progress Notes (Signed)
Initial Nutrition Assessment  DOCUMENTATION CODES:   Obesity unspecified  INTERVENTION:   Recommend cortrak placement and initiate tube feeding via Cortrak tube: Osmolite 1.5 at 40 ml/h (960 ml per day) Prosource TF 45 ml BID  Provides 1520 kcal, 82 gm protein, 729 ml free water daily  MVI with minerals daily  NUTRITION DIAGNOSIS:   Inadequate oral intake related to inability to eat as evidenced by NPO status.  GOAL:   Patient will meet greater than or equal to 90% of their needs  MONITOR:   I & O's  REASON FOR ASSESSMENT:    (Rounds)    ASSESSMENT:   Pt with PMH of DM, Afib on Eliquis, CKD stage III, CHF, HTN, anemia of chronic illness and sphenoidal meningioma admitted for new onset seizures.   Pt discussed during ICU rounds and with RN and CCM MD. Per MD may need cortrak tomorrow for medications and nutrition due to lethargy. Pt on continuous EEG.   Spoke with pt's husband who is at bedside. He reports no recent weight changes PTA. She usually eats 2 meals per day which includes a light Breakfast and then a heavy supper. She tries to eat supper by 6 pm. Per husband pt fell out of bed recently and has been nervous   6/24 admit with new onset seizures 7/3 s/p bifrontal craniotomy for meningioma resection 7/6 less responsive, vomiting overnight with suspected aspiration     NUTRITION - FOCUSED PHYSICAL EXAM:  Flowsheet Row Most Recent Value  Orbital Region Unable to assess  Upper Arm Region No depletion  Thoracic and Lumbar Region No depletion  Buccal Region No depletion  Temple Region Unable to assess  [head wrapped]  Clavicle Bone Region No depletion  Clavicle and Acromion Bone Region No depletion  Scapular Bone Region Unable to assess  Dorsal Hand No depletion  Patellar Region No depletion  Anterior Thigh Region No depletion  Posterior Calf Region No depletion  Edema (RD Assessment) Moderate  Hair Unable to assess  Eyes Unable to assess  Mouth  Unable to assess  Skin Reviewed  Nails Reviewed       Diet Order:   Diet Order             Diet NPO time specified  Diet effective now                   EDUCATION NEEDS:   Not appropriate for education at this time  Skin:  Skin Assessment: Reviewed RN Assessment (head incision)  Last BM:  7/2  Height:   Ht Readings from Last 1 Encounters:  03/15/22 '4\' 9"'$  (1.448 m)    Weight:   Wt Readings from Last 1 Encounters:  03/15/22 85 kg    BMI:  Body mass index is 40.55 kg/m.  Estimated Nutritional Needs:   Kcal:  1400-1600  Protein:  70-85 grams  Fluid:  >1.5 L/day  Lockie Pares., RD, LDN, CNSC See AMiON for contact information

## 2022-03-27 NOTE — Progress Notes (Signed)
Patient with episode of emesis x2, most likely aspirated.  Zofran given and seems to be effective.  Notified dr Christella Noa of emesis and persistent left sided gaze where patient had right sided gaze earlier today.  No orders given.  Also notified CCM of likely aspiration.  No new orders at this time.  Patient is breathing comfortably and satting 95%

## 2022-03-27 NOTE — Progress Notes (Addendum)
Patient ID: Tara [name], female   DOB: May 21, 1946, 76 y.o.   MRN: [id number] BP (!) 153/69 (BP Location: Right Arm)   Pulse 77   Temp 97.9 F (36.6 C) (Axillary)   Resp 15   Ht '4\' 9"'$  (1.448 m)   Wt 85 kg   SpO2 93%   BMI 40.55 kg/m  Not responsive, undergoing eeg at this time. Felt that she might have been seizing earlier. Repeat head ct is unchanged Believe it is premature to consider removing the central line. Access much more important than the possibility of infection. Anesthesia placed the line due to the difficulty obtaining an IV line.

## 2022-03-28 ENCOUNTER — Encounter (HOSPITAL_COMMUNITY): Payer: Medicare Other

## 2022-03-28 ENCOUNTER — Inpatient Hospital Stay (HOSPITAL_COMMUNITY): Payer: Medicare Other

## 2022-03-28 DIAGNOSIS — E440 Moderate protein-calorie malnutrition: Secondary | ICD-10-CM | POA: Diagnosis not present

## 2022-03-28 DIAGNOSIS — I482 Chronic atrial fibrillation, unspecified: Secondary | ICD-10-CM | POA: Diagnosis not present

## 2022-03-28 DIAGNOSIS — R569 Unspecified convulsions: Secondary | ICD-10-CM | POA: Diagnosis not present

## 2022-03-28 DIAGNOSIS — D329 Benign neoplasm of meninges, unspecified: Secondary | ICD-10-CM | POA: Diagnosis not present

## 2022-03-28 LAB — GLUCOSE, CAPILLARY
Glucose-Capillary: 105 mg/dL — ABNORMAL HIGH (ref 70–99)
Glucose-Capillary: 106 mg/dL — ABNORMAL HIGH (ref 70–99)
Glucose-Capillary: 102 mg/dL — ABNORMAL HIGH (ref 70–99)
Glucose-Capillary: 186 mg/dL — ABNORMAL HIGH (ref 70–99)
Glucose-Capillary: 218 mg/dL — ABNORMAL HIGH (ref 70–99)
Glucose-Capillary: 202 mg/dL — ABNORMAL HIGH (ref 70–99)

## 2022-03-28 LAB — BASIC METABOLIC PANEL
Anion gap: 11 (ref 5–15)
BUN: 19 mg/dL (ref 8–23)
CO2: 21 mmol/L — ABNORMAL LOW (ref 22–32)
Calcium: 7.9 mg/dL — ABNORMAL LOW (ref 8.9–10.3)
Chloride: 110 mmol/L (ref 98–111)
Creatinine, Ser: 0.99 mg/dL (ref 0.44–1.00)
GFR, Estimated: 59 mL/min — ABNORMAL LOW (ref >60)
Glucose, Bld: 121 mg/dL — ABNORMAL HIGH (ref 70–99)
Potassium: 3.8 mmol/L (ref 3.5–5.1)
Sodium: 142 mmol/L (ref 135–145)

## 2022-03-28 LAB — TYPE AND SCREEN
ABO/RH(D): B POS
Antibody Screen: NEGATIVE
Unit division: 0
Unit division: 0
Unit division: 0
Unit division: 0

## 2022-03-28 LAB — BPAM RBC
Blood Product Expiration Date: 202307292359
Blood Product Expiration Date: 202307292359
Blood Product Expiration Date: 202307292359
Blood Product Expiration Date: 202307292359
ISSUE DATE / TIME: 202307031423
ISSUE DATE / TIME: 202307031423
ISSUE DATE / TIME: 202307031423
ISSUE DATE / TIME: 202307031423
Unit Type and Rh: 7300
Unit Type and Rh: 7300
Unit Type and Rh: 7300
Unit Type and Rh: 7300

## 2022-03-28 LAB — CBC
HCT: 24.4 % — ABNORMAL LOW (ref 36.0–46.0)
Hemoglobin: 7.9 g/dL — ABNORMAL LOW (ref 12.0–15.0)
MCH: 28.4 pg (ref 26.0–34.0)
MCHC: 32.4 g/dL (ref 30.0–36.0)
MCV: 87.8 fL (ref 80.0–100.0)
Platelets: 64 10*3/uL — ABNORMAL LOW (ref 150–400)
RBC: 2.78 MIL/uL — ABNORMAL LOW (ref 3.87–5.11)
RDW: 17.4 % — ABNORMAL HIGH (ref 11.5–15.5)
WBC: 11.1 10*3/uL — ABNORMAL HIGH (ref 4.0–10.5)
nRBC: 0 % (ref 0.0–0.2)

## 2022-03-28 LAB — PHOSPHORUS
Phosphorus: 3.3 mg/dL (ref 2.5–4.6)
Phosphorus: 3.3 mg/dL (ref 2.5–4.6)

## 2022-03-28 LAB — MAGNESIUM
Magnesium: 2.2 mg/dL (ref 1.7–2.4)
Magnesium: 2.3 mg/dL (ref 1.7–2.4)

## 2022-03-28 LAB — SURGICAL PATHOLOGY

## 2022-03-28 MED ORDER — SENNOSIDES-DOCUSATE SODIUM 8.6-50 MG PO TABS: 1.0000 | ORAL_TABLET | Freq: Every evening | ORAL | Status: DC | PRN

## 2022-03-28 MED ORDER — SERTRALINE HCL 50 MG PO TABS
25.0000 mg | ORAL_TABLET | Freq: Every day | ORAL | Status: DC
  Administered 2022-03-28 – 2022-05-05 (×39): 25 mg
  Filled 2022-03-28 (×39): qty 1

## 2022-03-28 MED ORDER — AMIODARONE HCL 200 MG PO TABS
200.0000 mg | ORAL_TABLET | Freq: Every day | ORAL | Status: DC
  Administered 2022-03-28 – 2022-05-05 (×39): 200 mg
  Filled 2022-03-28 (×39): qty 1

## 2022-03-28 MED ORDER — PANTOPRAZOLE 2 MG/ML SUSPENSION
40.0000 mg | Freq: Every day | ORAL | Status: DC
  Administered 2022-03-28 – 2022-04-20 (×24): 40 mg
  Filled 2022-03-28 (×24): qty 20

## 2022-03-28 MED ORDER — GUAIFENESIN 100 MG/5ML PO LIQD
5.0000 mL | ORAL | Status: DC | PRN
  Administered 2022-04-04: 5 mL
  Filled 2022-03-28: qty 15

## 2022-03-28 MED ORDER — ORAL CARE MOUTH RINSE: 15.0000 mL | OROMUCOSAL | Status: DC | PRN

## 2022-03-28 MED ORDER — PROSOURCE TF PO LIQD
45.0000 mL | Freq: Two times a day (BID) | ORAL | Status: DC
  Administered 2022-03-28 – 2022-04-01 (×9): 45 mL
  Filled 2022-03-28 (×9): qty 45

## 2022-03-28 MED ORDER — SENNA 8.6 MG PO TABS
1.0000 | ORAL_TABLET | Freq: Two times a day (BID) | ORAL | Status: DC
  Administered 2022-03-28 – 2022-05-05 (×53): 8.6 mg
  Filled 2022-03-28 (×65): qty 1

## 2022-03-28 MED ORDER — ACETAMINOPHEN 325 MG PO TABS
650.0000 mg | ORAL_TABLET | Freq: Four times a day (QID) | ORAL | Status: DC | PRN
  Administered 2022-04-06 – 2022-05-03 (×15): 650 mg
  Filled 2022-03-28 (×15): qty 2

## 2022-03-28 MED ORDER — OSMOLITE 1.5 CAL PO LIQD
1000.0000 mL | ORAL | Status: DC
  Administered 2022-03-28: 1000 mL

## 2022-03-28 MED ORDER — ROSUVASTATIN CALCIUM 20 MG PO TABS
20.0000 mg | ORAL_TABLET | Freq: Every day | ORAL | Status: DC
  Administered 2022-03-28 – 2022-04-17 (×21): 20 mg
  Filled 2022-03-28 (×21): qty 1

## 2022-03-28 MED ORDER — MONTELUKAST SODIUM 10 MG PO TABS
10.0000 mg | ORAL_TABLET | Freq: Every day | ORAL | Status: DC
  Administered 2022-03-28 – 2022-05-05 (×39): 10 mg
  Filled 2022-03-28 (×42): qty 1

## 2022-03-28 MED ORDER — CARVEDILOL 3.125 MG PO TABS
3.1250 mg | ORAL_TABLET | Freq: Two times a day (BID) | ORAL | Status: DC
  Administered 2022-03-28 – 2022-04-02 (×10): 3.125 mg
  Filled 2022-03-28 (×11): qty 1

## 2022-03-28 MED ORDER — HYDROCODONE-ACETAMINOPHEN 5-325 MG PO TABS: 1.0000 | ORAL_TABLET | ORAL | Status: DC | PRN

## 2022-03-28 MED ORDER — CHLORHEXIDINE GLUCONATE 0.12 % MT SOLN
OROMUCOSAL | Status: AC
  Filled 2022-03-28: qty 15

## 2022-03-28 MED ORDER — ACETAMINOPHEN 650 MG RE SUPP: 650.0000 mg | Freq: Four times a day (QID) | RECTAL | Status: DC | PRN

## 2022-03-28 MED ORDER — ADULT MULTIVITAMIN W/MINERALS CH
1.0000 | ORAL_TABLET | Freq: Every day | ORAL | Status: DC
  Administered 2022-03-28 – 2022-05-05 (×39): 1
  Filled 2022-03-28 (×39): qty 1

## 2022-03-28 NOTE — Progress Notes (Signed)
Nutrition Follow-up  DOCUMENTATION CODES:   Obesity unspecified  INTERVENTION:   Initiate tube feeding via Cortrak tube: Osmolite 1.5 at 40 ml/h (960 ml per day) Prosource TF 45 ml BID  Provides 1520 kcal, 82 gm protein, 729 ml free water daily  MVI with minerals daily  NUTRITION DIAGNOSIS:   Inadequate oral intake related to inability to eat as evidenced by NPO status. Ongoing.   GOAL:   Patient will meet greater than or equal to 90% of their needs Progressing towards goal  MONITOR:   TF tolerance  REASON FOR ASSESSMENT:    (Rounds)    ASSESSMENT:   Pt with PMH of DM, Afib on Eliquis, CKD stage III, CHF, HTN, anemia of chronic illness and sphenoidal meningioma admitted for new onset seizures.   Pt discussed during ICU rounds and with RN and CCM MD.  Continuous EEG has been negative for active seizures.   6/24 admit with new onset seizures 7/3 s/p bifrontal craniotomy for meningioma resection 7/6 less responsive, vomiting overnight with suspected aspiration 7/7 s/p cortrak placement; tip distal stomach/proximal duodenum     Medications reviewed and include: decadron, SSI, protonix, senokot Keppra  Labs reviewed    Diet Order:   Diet Order             Diet NPO time specified  Diet effective now                   EDUCATION NEEDS:   Not appropriate for education at this time  Skin:  Skin Assessment: Reviewed RN Assessment (head incision)  Last BM:  7/2  Height:   Ht Readings from Last 1 Encounters:  03/15/22 '4\' 9"'$  (1.448 m)    Weight:   Wt Readings from Last 1 Encounters:  03/15/22 85 kg    BMI:  Body mass index is 40.55 kg/m.  Estimated Nutritional Needs:   Kcal:  1400-1600  Protein:  70-85 grams  Fluid:  >1.5 L/day  Lockie Pares., RD, LDN, CNSC See AMiON for contact information

## 2022-03-28 NOTE — Progress Notes (Signed)
LTM maint complete - no skin breakdown under:  Ref no skin breakdown.

## 2022-03-28 NOTE — Progress Notes (Signed)
Subjective: No seizures overnight. Sisters at bedside.   ROS: negative except above  Examination  Vital signs in last 24 hours: Temp:  [97.3 F (36.3 C)-98.5 F (36.9 C)] 98.5 F (36.9 C) (07/07 0800) Pulse Rate:  [66-87] 78 (07/07 0900) Resp:  [11-19] 19 (07/07 0900) BP: (120-172)/(57-81) 158/71 (07/07 0900) SpO2:  [92 %-96 %] 95 % (07/07 0900)  General: lying in bed, left peri-orbital edema ( improving) Neuro: awake, alert, oriented to self, place and time, follows commands, able to name objects, PERLA, has trouble tracking examiner, antigravity strength in all extremities  Basic Metabolic Panel: Recent Labs  Lab 03/24/22 0412 03/24/22 1516 03/24/22 1631 03/25/22 0512 03/26/22 0529 03/27/22 0558 03/28/22 0500  NA 134*   < > 134* 138 139 138 142  K 4.1   < > 4.5 4.6 4.1 4.0 3.8  CL 98  --   --  106 108 107 110  CO2 22  --   --  20* 21* 20* 21*  GLUCOSE 199*  --   --  186* 227* 184* 121*  BUN 32*  --   --  27* '22 23 19  '$ CREATININE 1.24*  --   --  1.05* 1.02* 1.01* 0.99  CALCIUM 8.3*  --   --  7.6* 7.7* 8.1* 7.9*  MG 2.3  --   --  2.1 2.1 2.3 2.2   < > = values in this interval not displayed.    CBC: Recent Labs  Lab 03/24/22 0412 03/24/22 1516 03/24/22 1631 03/25/22 0512 03/26/22 0529 03/27/22 0558 03/28/22 0500  WBC 10.9*  --   --  14.5* 11.9* 12.0* 11.1*  HGB 8.5*   < > 9.9* 10.5* 9.3* 8.8* 7.9*  HCT 26.5*   < > 29.0* 31.7* 28.3* 27.3* 24.4*  MCV 87.2  --   --  84.8 86.0 87.8 87.8  PLT 146*  --   --  101* 66* 66* 64*   < > = values in this interval not displayed.     Coagulation Studies: No results for input(s): "LABPROT", "INR" in the last 72 hours.  Imaging CTH wo contrast 03/27/2022: No substantial change in postoperative appearance, including pneumocephalus, extra-axial hemorrhage and intraparenchymal hemorrhage. Similar bifrontal edema and/or developing encephalomalacia at the site of resection.   ASSESSMENT AND PLAN: 76 year old female with  meningioma who presented with seizure.   Meningioma s/p resection Cerebral edema Epilepsy Thrombocytopenia Acute encephalopathy -Intermittent episodes of gaze deviation could be due to seizures versus encephalopathy -Unclear etiology of encephalopathy.  Could be due to medications, worsening hemorrhage, delirium  Recommendations -Video EEG for evaluation of intermittent gaze deviation. DC tomorrow if stable overnight -Continue Keppra 500 mg twice daily, Vimpat 200 mg twice daily  -If seizures recur, can increase Keppra to 750 mg twice daily -Continue seizure precautions -Discussed plan with patient, husband at bedside - Management of rest of comorbidities per primary team   I have spent a total of  25 minutes with the patient reviewing hospital notes,  test results, labs and examining the patient as well as establishing an assessment and plan that was discussed personally with the patient's sisters at bedside.  > 50% of time was spent in direct patient care.  Zeb Comfort Epilepsy [facility] For questions after 5pm please refer to AMION to reach the Neurologist on call

## 2022-03-28 NOTE — Procedures (Signed)
Cortrak  Tube Type:  Cortrak - 43 inches Tube Location:  Left nare Initial Placement:  Stomach Secured by: Bridle Technique Used to Measure Tube Placement:  Marking at nare/corner of mouth Cortrak Secured At:  70 cm  Cortrak Tube Team Note:  Consult received to place a Cortrak feeding tube.   X-ray is required, abdominal x-ray has been ordered by the Cortrak team. Please confirm tube placement before using the Cortrak tube.   If the tube becomes dislodged please keep the tube and contact the Cortrak team at [email or web address] ([email or web address]) for replacement.  If after hours and replacement cannot be delayed, place a NG tube and confirm placement with an abdominal x-ray.    Tamalyn Wadsworth MS, RD, LDN Please refer to AMION for RD and/or RD on-call/weekend/after hours pager   

## 2022-03-28 NOTE — Progress Notes (Signed)
Physical Therapy Treatment Patient Details Name: Tara [name] MRN: [id number] DOB: [birth year] Today's Date: 03/28/2022   History of Present Illness 76 yo woman admitted 03/15/22 with new onset seizures related to meningioma. s/p tumor resection 7/3. Recent DC from [facility] 6/22. PMH: CKD stage 3, HFpEF, a fib,  meningioma.    PT Comments    Pt still remains lethargic, but was able to arouse with stimulation and verbally respond to therapist at times. Pt opening eyes briefly intermittently when cued. Pt maintaining L gaze preference today, only tracking to R partially for MD. She continues to require TA for bed mobility but progressed to only requiring min guard-minA to sit EOB. Will continue to follow acutely. Current recommendations remain appropriate.     Recommendations for follow up therapy are one component of a multi-disciplinary discharge planning process, led by the attending physician.  Recommendations may be updated based on patient status, additional functional criteria and insurance authorization.  Follow Up Recommendations  Acute inpatient rehab (3hours/day) Can patient physically be transported by private vehicle: No   Assistance Recommended at Discharge Frequent or constant Supervision/Assistance  Patient can return home with the following Two people to help with walking and/or transfers;Two people to help with bathing/dressing/bathroom;Assistance with cooking/housework;Direct supervision/assist for medications management;Help with stairs or ramp for entrance;Direct supervision/assist for financial management;Assist for transportation;Assistance with feeding   Equipment Recommendations  Wheelchair (measurements PT);Wheelchair cushion (measurements PT);Hospital bed;Other (comment) (lift equipment; pending progress)    Recommendations for Other Services       Precautions / Restrictions Precautions Precautions: Fall Precaution Comments: LUE edema; seizures Restrictions Weight  Bearing Restrictions: No     Mobility  Bed Mobility Overal bed mobility: Needs Assistance Bed Mobility: Supine to Sit, Sit to Supine     Supine to sit: Total assist Sit to supine: Total assist   General bed mobility comments: Min to no initiation by pt, needing TA to perform bed mobility    Transfers                   General transfer comment: deferred    Ambulation/Gait               General Gait Details: deferred   Stairs             Wheelchair Mobility    Modified Rankin (Stroke Patients Only) Modified Rankin (Stroke Patients Only) Pre-Morbid Rankin Score: Moderately severe disability Modified Rankin: Severe disability     Balance Overall balance assessment: Needs assistance Sitting-balance support: Single extremity supported, Bilateral upper extremity supported, Feet supported Sitting balance-Leahy Scale: Poor Sitting balance - Comments: Pt able to sit statically EOB with min guard assist, but began to demonstrate trunk instability with fatigue, needing up to minA to prevent LOB       Standing balance comment: deferred                            Cognition Arousal/Alertness: Lethargic Behavior During Therapy: Flat affect Overall Cognitive Status: Impaired/Different from baseline Area of Impairment: Following commands, Attention, Problem solving                   Current Attention Level: Focused   Following Commands: Follows one step commands with increased time, Follows one step commands inconsistently     Problem Solving: Slow processing, Decreased initiation, Difficulty sequencing, Requires verbal cues, Requires tactile cues General Comments: lethargy making difficult to fully assess cognition. Patient with  fixated L lateral gaze, would not track to R with PT but did slightly for MD. Pt with increased time to process and initiate tasks when provided simple multi-modal cues. Follows ~35% of cues. Poor sequencing         Exercises Other Exercises Other Exercises: AAROM of bil elbows into flexion/extension and bil wrists/hands into flexion/extension Other Exercises: AAROM of bil legs into hip and knee flexion/extension (increased stiffness noted on L)    General Comments General comments (skin integrity, edema, etc.): ordered prevalon boots      Pertinent Vitals/Pain Pain Assessment Pain Assessment: Faces Faces Pain Scale: Hurts little more Pain Location: generalized Pain Descriptors / Indicators: Grimacing, Moaning Pain Intervention(s): Limited activity within patient's tolerance, Monitored during session, Repositioned    Home Living                          Prior Function            PT Goals (current goals can now be found in the care plan section) Acute Rehab PT Goals Patient Stated Goal: did not state PT Goal Formulation: With patient/family Time For Goal Achievement: 04/08/22 Potential to Achieve Goals: Fair Progress towards PT goals: Progressing toward goals    Frequency    Min 4X/week      PT Plan Equipment recommendations need to be updated    Co-evaluation              AM-PAC PT "6 Clicks" Mobility   Outcome Measure  Help needed turning from your back to your side while in a flat bed without using bedrails?: Total Help needed moving from lying on your back to sitting on the side of a flat bed without using bedrails?: Total Help needed moving to and from a bed to a chair (including a wheelchair)?: Total Help needed standing up from a chair using your arms (e.g., wheelchair or bedside chair)?: Total Help needed to walk in hospital room?: Total Help needed climbing 3-5 steps with a railing? : Total 6 Click Score: 6    End of Session   Activity Tolerance: Patient limited by lethargy Patient left: in bed;with call bell/phone within reach;with family/visitor present;with bed alarm set   PT Visit Diagnosis: Unsteadiness on feet (R26.81);Muscle  weakness (generalized) (M62.81);Other symptoms and signs involving the nervous system (R29.898);Other abnormalities of gait and mobility (R26.89);Difficulty in walking, not elsewhere classified (R26.2)     Time: 9937-1696 PT Time Calculation (min) (ACUTE ONLY): 24 min  Charges:  $Therapeutic Exercise: 8-22 mins $Therapeutic Activity: 8-22 mins                     Moishe Spice, PT, DPT Acute Rehabilitation Services  Office: [phone]    Orvan Falconer 03/28/2022, 5:18 PM

## 2022-03-28 NOTE — Progress Notes (Signed)
NAME:  Tara [name], MRN:  [id number], DOB:  1946-01-21, LOS: 28 ADMISSION DATE:  03/15/2022, CONSULTATION DATE:  03/28/22 REFERRING MD:  Christella Noa, CHIEF COMPLAINT:  Meningioma, Seizure   History of Present Illness:  Tara [name] is a 76 y.o. F with PMH significant for Anxiety, Depression, T2DM, HTN, HL, cardiomyopathy, Atrial Fibrillation on Eliquis, CKD IIIb, breast Ca s/p chemoradiation in 2008 who presented to the ED 6/24 with new onset seizures.   She had a known planum sphenoidal meningioma that was increased in size on CT with surrounding edema, so was transferred to [facility] for further evaluation.   LTM showed no further seizures.  She was evaluated by neurosurgery and underwent bifrontal craniotomy for meningioma resection on 7/3 and was transferred to intensive care post-op with PCCM consult while in ICU.    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   Significant Hospital Events: Including procedures, antibiotic start and stop dates in addition to other pertinent events   6/24 presented to [facility] ED with new onset seizures, CTH with enlarging hematoma, loaded with Keppra 7/3 Underwent bifrontal craniotomy for meningioma, monitor in ICU 7/5 stable, no further seizure activity 7/6 less responsive, two episodes of vomiting overnight and increased oral secretions today.  Continuous blinking on exam. CTH yesterday with  new 7 mm thick subdural hematoma at the foramen magnum and tracking adjacent to the basion and odontoid not currently causing critical stenosis of the foramen magnum per radiology read, but merits surveillance. 7/7 Better night and more awake and responsive, repeat head CT unchanged and no seizures on video EEG  Interim History / Subjective:  More responsive today, following commands and giving short answers to questions, afebrile.  850cc UOP. Hgb drifting down without evidence of bleeding  Objective   Blood pressure (!) 161/73, pulse 72, temperature 98.2 F (36.8 C), temperature source Axillary, resp. rate 14, height '4\' 9"'$  (1.448 m), weight 85 kg, SpO2 94 %.        Intake/Output Summary (Last 24 hours) at 03/28/2022 0743 Last data filed at 03/28/2022 0700 Gross per 24 hour  Intake 1423.4 ml  Output 850 ml  Net 573.4 ml    Filed Weights   03/15/22 0130  Weight: 85 kg   General:  elderly, ill-appearing F resting in bed in no distress HEENT: MM pink/moist, improving L peri-orbital ecchymosis, diffuse facial edema  Neuro: sleeping, arousable to voice, opens eyes, no gaze deviation, moving all extremities to command CV: s1s2 , no m/r/g PULM:  decreased air entry bilateral bases, on RA no distress, no rhonchi or wheezing GI: soft, bsx4 active  Extremities: warm/dry, 2+ LE edema, third spacing UE Skin: no rashes or lesions   Labs reviewed Glu 121 Creatinine 0.99 CBC 11.1 Hgb 7.9 Platelets 64  Resolved Hospital Problem list   AKI  Assessment & Plan:    Sphenoidal Meningioma, enlarging with new vasogenic edema and new onset seizures Post-op day #4 s/p bifrontal craniotomy  -close neuro monitoring in ICU, post-op plan per Neurosurgery -Neurologically improved today without continuous blinking, gaze deviation and is following commands, LTM  EEG without sz, CTH unchanged this morning -Cortrak -on unasyn after episode of vomiting for possible aspiration -on Keppra and Vimpat, if  seizures re-occur can increase Keppra -seizure precautions with prn benzodiazepines -on decadron taper, continue '4mg'$  q12, decreased 7/5   Thrombocytopenia  ABLA Acute on chronic thrombocytopenia Slow Hgb drift without evidence of bleeding -platelets stabilized, 64 today, Hgb 7.9 -continue to hold heparin -follow CBC, transfuse prn   HTN HFpEF, chronic Atrial Fibrillation Echo 6/9 with EF 55% and Grade I diastolic dysfunction No RVR this morning -holding Eliquis -holding po medications in the setting of AMS, cortrak today, resume Amiodarone and coreg   Type 2 DM -holding home Jardiance -SSI, increase to moderate -goal glucose 140-180     Best Practice (right click and "Reselect all SmartList Selections" daily)   Diet/type: NPO DVT prophylaxis: SCD GI prophylaxis: N/A Lines: Central line, keep for poor peripheral access per Dr. Christella Noa Foley:  N/A Code Status:  full code Last date of multidisciplinary goals of care discussion [per primary, husband at bedside updated 7/7]  Labs   CBC: Recent Labs  Lab 03/24/22 0412 03/24/22 1516 03/24/22 1631 03/25/22 0512 03/26/22 0529 03/27/22 0558 03/28/22 0500  WBC 10.9*  --   --  14.5* 11.9* 12.0* 11.1*  HGB 8.5*   < > 9.9* 10.5* 9.3* 8.8* 7.9*  HCT 26.5*   < > 29.0* 31.7* 28.3* 27.3* 24.4*  MCV 87.2  --   --  84.8 86.0 87.8 87.8  PLT 146*  --   --  101* 66* 66* 64*   < > = values in this interval not displayed.     Basic Metabolic Panel: Recent Labs  Lab 03/24/22 0412 03/24/22 1516 03/24/22 1631 03/25/22 0512 03/26/22 0529 03/27/22 0558 03/28/22 0500  NA 134*   < > 134* 138 139 138 142  K 4.1   < > 4.5 4.6 4.1 4.0 3.8  CL 98  --   --  106 108 107 110  CO2 22  --   --  20* 21* 20* 21*  GLUCOSE 199*  --   --  186* 227* 184* 121*  BUN 32*  --   --  27* '22 23 19  '$ CREATININE 1.24*  --   --  1.05* 1.02* 1.01* 0.99  CALCIUM 8.3*  --   --  7.6* 7.7* 8.1* 7.9*  MG 2.3  --   --  2.1 2.1 2.3 2.2   < > = values in  this interval not displayed.    GFR: Estimated Creatinine Clearance: 43.7 mL/min (by C-G formula based on SCr of 0.99 mg/dL). Recent Labs  Lab 03/25/22 0512 03/26/22 0529 03/27/22 0558 03/28/22 0500  WBC 14.5* 11.9* 12.0* 11.1*     Liver Function Tests: No results for input(s): "AST", "ALT", "ALKPHOS", "BILITOT", "PROT", "ALBUMIN" in the last 168 hours.  No results for input(s): "LIPASE", "AMYLASE" in the last 168 hours. No results for input(s): "AMMONIA" in the last 168 hours.  ABG    Component Value Date/Time   PHART 7.359 03/24/2022 1631   PCO2ART 40.6 03/24/2022 1631   PO2ART 236 (H) 03/24/2022 1631   HCO3 23.4 03/24/2022 1631   TCO2 25 03/24/2022 1631   ACIDBASEDEF 3.0 (H) 03/24/2022 1631   O2SAT 100 03/24/2022 1631     Coagulation Profile: No results for input(s): "INR", "PROTIME" in the last 168 hours.  Cardiac Enzymes: No results for input(s): "CKTOTAL", "CKMB", "CKMBINDEX", "TROPONINI" in the last 168 hours.  HbA1C: Hgb A1c MFr  Bld  Date/Time Value Ref Range Status  02/27/2022 04:59 PM 6.3 (H) 4.8 - 5.6 % Final    Comment:    (NOTE) Pre diabetes:          5.7%-6.4%  Diabetes:              >6.4%  Glycemic control for   <7.0% adults with diabetes   01/12/2018 12:35 PM 6.0 (H) <5.7 % of total Hgb Final    Comment:    For someone without known diabetes, a hemoglobin  A1c value between 5.7% and 6.4% is consistent with prediabetes and should be confirmed with a  follow-up test. . For someone with known diabetes, a value <7% indicates that their diabetes is well controlled. A1c targets should be individualized based on duration of diabetes, age, comorbid conditions, and other considerations. . This assay result is consistent with an increased risk of diabetes. . Currently, no consensus exists regarding use of hemoglobin A1c for diagnosis of diabetes for children. .     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   Surgical History:   Past Surgical History:  Procedure Laterality Date   ABDOMINAL HYSTERECTOMY  1994   fibroids,    BIOPSY  06/19/2016   Procedure: BIOPSY;  Surgeon: Daneil Dolin, MD;  [facility];  Service: Endoscopy;;  gastric duodenum   BREAST BIOPSY Right 12/16/2006   malignant   BREAST EXCISIONAL BIOPSY Right 2018   benign lumpectomy   BREAST LUMPECTOMY Right 02/2007   BREAST LUMPECTOMY WITH RADIOACTIVE SEED LOCALIZATION Right 07/29/2017   Procedure: RIGHT BREAST LUMPECTOMY WITH RADIOACTIVE SEED LOCALIZATION;  Surgeon: Fanny Skates, MD;  Location: [facility];  Service: General;  Laterality: Right;   BREAST SURGERY Right 2008   lumpectomy, cancer   CARDIAC CATHETERIZATION     CHOLECYSTECTOMY  1999   COLONOSCOPY  2008   Dr. Oneida Alar: multiple polyps. Path not available at time of visit.    COLONOSCOPY WITH PROPOFOL N/A 04/25/2014   Dr. Oneida Alar: Multiple tubular adenomas removed. Diverticulosis. Moderate internal hemorrhoids. Next colonoscopy planned for August 2018.   CRANIOTOMY Bilateral 03/24/2022   Procedure: Bifrontal craniotomy for resection of meningioma;  Surgeon: Ashok Pall, MD;  Location: [facility];  Service:  Neurosurgery;  Laterality: Bilateral;   ESOPHAGOGASTRODUODENOSCOPY (EGD) WITH PROPOFOL N/A 06/19/2016   Procedure: ESOPHAGOGASTRODUODENOSCOPY (EGD) WITH PROPOFOL;  Surgeon: Daneil Dolin, MD;  [facility];  Service: Endoscopy;  Laterality: N/A;   MASTECTOMY W/ SENTINEL NODE BIOPSY Right 06/09/2018   Procedure: RIGHT TOTAL MASTECTOMY WITH SENTINEL LYMPH NODE BIOPSY;  Surgeon: Fanny Skates, MD;  Location: [facility];  Service: General;  Laterality: Right;   POLYPECTOMY N/A 04/25/2014   Procedure: POLYPECTOMY;  Surgeon: Danie Binder, MD;  Location: [facility];  Service: Endoscopy;  Laterality: N/A;  Ascending and Decending Colon x3 , Transverse colon x2,  rectal   PORTACATH PLACEMENT N/A 06/09/2018   Procedure: INSERTION PORT-A-CATH;  Surgeon: Fanny Skates, MD;  Location: [facility];  Service: General;  Laterality: N/A;   RIGHT/LEFT HEART CATH AND CORONARY ANGIOGRAPHY N/A 07/10/2017   Procedure: RIGHT/LEFT HEART CATH AND CORONARY ANGIOGRAPHY;  Surgeon: Larey Dresser, MD;  Location: [facility];  Service: Cardiovascular;  Laterality: N/A;     Social History:   reports that she has never smoked. She has never used smokeless tobacco. She reports that she does not drink alcohol and does not use drugs.   Family History:  Her family history includes Cancer in her cousin, cousin, paternal aunt, and sister; Cancer (age of onset: 29) in her brother; Colon cancer (age of onset: 48) in her father; Dementia in her mother; Heart disease in her sister; Hypertension in her sister; Lung cancer in her paternal uncle; Other in her paternal aunt, paternal uncle, and paternal uncle; Prostate cancer in her cousin; Stroke (age of onset: 32) in her mother.   Allergies Allergies  Allergen Reactions   Aspirin Other (See Comments)    Reports GI bleed--pt is currently taking   Lisinopril Cough   Sudafed [Pseudoephedrine Hcl]     Pt reports she had drainage in throat that made her throat hurt.      Home  Medications  Prior to Admission medications   Medication Sig Start Date End Date Taking? Authorizing Provider  acetaminophen (TYLENOL) 500 MG tablet Take 500-1,000 mg by mouth every 6 (six) hours as needed (FOR PAIN.).   Yes [provider]  amiodarone (PACERONE) 200 MG tablet Take 1 tablet (200 mg total) by mouth daily. 03/13/22  Yes Thurnell Lose, MD  apixaban (ELIQUIS) 2.5 MG TABS tablet Take 1 tablet (2.5 mg total) by mouth 2 (two) times daily. 03/12/22  Yes Thurnell Lose, MD  azelastine (ASTELIN) 0.1 % nasal spray Place 1 spray into both nostrils daily as needed for rhinitis. Use in each nostril as directed   Yes [provider]  Cholecalciferol (VITAMIN D) 50 MCG (2000 UT) CAPS Take 2,000 Units by mouth daily.   Yes [provider]  clonazePAM (KLONOPIN) 0.5 MG tablet Take 1 tablet (0.5 mg total) by mouth at bedtime. 01/20/22  Yes [name], MD  dexamethasone (DECADRON) 0.5 MG tablet Take 2 tabs for 3 days, then 1 tab for 3 days then stop. 03/13/22  Yes Thurnell Lose, MD  docusate sodium (COLACE) 100 MG capsule Take 200 mg by mouth 2 (two) times daily as needed for mild constipation.   Yes [provider]  JARDIANCE 10 MG TABS tablet TAKE ONE TABLET BY MOUTH ONCE DAILY. Patient taking differently: Take 10 mg by mouth daily. 01/03/22  Yes Larey Dresser, MD  montelukast (SINGULAIR) 10 MG tablet TAKE ONE TABLET BY MOUTH AT BEDTIME. Patient taking differently: Take 10 mg by mouth at bedtime. 09/03/21  Yes [name], MD  pantoprazole (PROTONIX) 40 MG tablet TAKE (1) TABLET BY MOUTH TWICE DAILY. Patient taking differently: 40 mg 2 (two) times daily. 11/18/21  Yes [name], MD  Phenylephrine-Witch [place]el (PREPARATION H) 0.25-50 % GEL Apply 1 Application topically daily as needed (hemorrhoid).   Yes [provider]  rosuvastatin (CRESTOR) 20 MG tablet Take 1 tablet (20 mg total) by mouth daily. 01/14/22  Yes [name], MD  sertraline (ZOLOFT) 25 MG tablet Take 1 tablet (25 mg total) by mouth daily. 01/20/22  Yes [name],  MD     Critical care time: n/a      Tara Carpen Jyron Turman, PA-C [facility] See Amion for pager If no response to pager , please call [phone] until 7pm After 7:00 pm call Elink  [phone]

## 2022-03-28 NOTE — Progress Notes (Signed)
Patient ID: Tara [name], female   DOB: August 21, 1946, 76 y.o.   MRN: [id number] BP (!) 151/64   Pulse 69   Temp 98 F (36.7 C) (Oral)   Resp 15   Ht '4\' 9"'$  (1.448 m)   Wt 85 kg   SpO2 93%   BMI 40.55 kg/m  Better day today, was interaction with family No seizures overnight Perrl, full eom Moving all extremities Remains in unit, continue current measures.

## 2022-03-28 NOTE — Procedures (Signed)
Patient Name: CARMEN TOLLIVER  MRN: [id number]  Epilepsy Attending: Lora Havens  Referring Physician/Provider: Jacky Kindle, MD  Duration: 03/27/2022 1034 to 03/28/2022  1034   Patient history: 76 y.o. female with past medical history of anxiety, depression, GERD, hypertension, hyperlipidemia, nonischemic cardiomyopathy, atrial fibrillation on eliquis, Systolic HF, hx of breast cancer, meningioma who presented to the hospital on 03/15/2022 for evaluation of new onset seizures.   Level of alertness: Awake, asleep   AEDs during EEG study: LEV, LCM, Clonazepam   Technical aspects: This EEG study was done with scalp electrodes positioned according to the 10-20 International system of electrode placement. Electrical activity was acquired at a sampling rate of '500Hz'$  and reviewed with a high frequency filter of '70Hz'$  and a low frequency filter of '1Hz'$ . EEG data were recorded continuously and digitally stored.    Description: During awake state, no clear posterior dominant rhythm was seen. Sleep was characterized by sleep spindles (12-'14Hz'$ ), maximal frontocentral region. EEG also showed continuous generalized 3 to 5 Hz theta-delta slowing.  Hyperventilation and photic stimulation were not performed.      ABNORMALITY - Continuous slow, generalized  IMPRESSION: This study is suggestive of moderate diffuse encephalopathy, nonspecific etiology. No seizures or epileptiform discharges were seen throughout the recording.    Aislinn Feliz Barbra Sarks

## 2022-03-29 DIAGNOSIS — R569 Unspecified convulsions: Secondary | ICD-10-CM | POA: Diagnosis not present

## 2022-03-29 LAB — GLUCOSE, CAPILLARY
Glucose-Capillary: 181 mg/dL — ABNORMAL HIGH (ref 70–99)
Glucose-Capillary: 186 mg/dL — ABNORMAL HIGH (ref 70–99)
Glucose-Capillary: 186 mg/dL — ABNORMAL HIGH (ref 70–99)
Glucose-Capillary: 187 mg/dL — ABNORMAL HIGH (ref 70–99)
Glucose-Capillary: 227 mg/dL — ABNORMAL HIGH (ref 70–99)
Glucose-Capillary: 228 mg/dL — ABNORMAL HIGH (ref 70–99)

## 2022-03-29 LAB — BASIC METABOLIC PANEL
Anion gap: 10 (ref 5–15)
BUN: 27 mg/dL — ABNORMAL HIGH (ref 8–23)
CO2: 22 mmol/L (ref 22–32)
Calcium: 8.1 mg/dL — ABNORMAL LOW (ref 8.9–10.3)
Chloride: 110 mmol/L (ref 98–111)
Creatinine, Ser: 0.93 mg/dL (ref 0.44–1.00)
GFR, Estimated: >60 mL/min (ref >60)
Glucose, Bld: 266 mg/dL — ABNORMAL HIGH (ref 70–99)
Potassium: 4 mmol/L (ref 3.5–5.1)
Sodium: 142 mmol/L (ref 135–145)

## 2022-03-29 LAB — PHOSPHORUS
Phosphorus: 2.8 mg/dL (ref 2.5–4.6)
Phosphorus: 2.9 mg/dL (ref 2.5–4.6)

## 2022-03-29 LAB — CBC
HCT: 25.7 % — ABNORMAL LOW (ref 36.0–46.0)
Hemoglobin: 8.1 g/dL — ABNORMAL LOW (ref 12.0–15.0)
MCH: 28.1 pg (ref 26.0–34.0)
MCHC: 31.5 g/dL (ref 30.0–36.0)
MCV: 89.2 fL (ref 80.0–100.0)
Platelets: 69 10*3/uL — ABNORMAL LOW (ref 150–400)
RBC: 2.88 MIL/uL — ABNORMAL LOW (ref 3.87–5.11)
RDW: 17.4 % — ABNORMAL HIGH (ref 11.5–15.5)
WBC: 10.9 10*3/uL — ABNORMAL HIGH (ref 4.0–10.5)
nRBC: 0.2 % (ref 0.0–0.2)

## 2022-03-29 LAB — MAGNESIUM
Magnesium: 2.3 mg/dL (ref 1.7–2.4)
Magnesium: 2.4 mg/dL (ref 1.7–2.4)

## 2022-03-29 MED ORDER — INSULIN GLARGINE-YFGN 100 UNIT/ML ~~LOC~~ SOLN
10.0000 [IU] | Freq: Every day | SUBCUTANEOUS | Status: DC
  Administered 2022-03-29 – 2022-04-20 (×17): 10 [IU] via SUBCUTANEOUS
  Filled 2022-03-29 (×23): qty 0.1

## 2022-03-29 MED ORDER — LACOSAMIDE 50 MG PO TABS
200.0000 mg | ORAL_TABLET | Freq: Two times a day (BID) | ORAL | Status: DC
Start: 2022-03-29 — End: 2022-05-06
  Administered 2022-03-29 – 2022-05-05 (×76): 200 mg
  Filled 2022-03-29 (×78): qty 4

## 2022-03-29 MED ORDER — GLUCERNA 1.5 CAL PO LIQD
1000.0000 mL | ORAL | Status: DC
  Administered 2022-03-29 – 2022-04-01 (×3): 1000 mL
  Filled 2022-03-29 (×4): qty 1000

## 2022-03-29 MED ORDER — LEVETIRACETAM 100 MG/ML PO SOLN
500.0000 mg | Freq: Two times a day (BID) | ORAL | Status: DC
  Administered 2022-03-29 – 2022-04-01 (×7): 500 mg
  Filled 2022-03-29 (×7): qty 5

## 2022-03-29 NOTE — Progress Notes (Signed)
LTM EEG discontinued - no skin breakdown at unhook.   

## 2022-03-29 NOTE — Progress Notes (Signed)
EEG maint complete.  ?

## 2022-03-29 NOTE — Progress Notes (Addendum)
NAME:  Tara [name], MRN:  [id number], DOB:  Jun 10, 1946, LOS: 62 ADMISSION DATE:  03/15/2022, CONSULTATION DATE:  03/29/22 REFERRING MD:  Christella Noa, CHIEF COMPLAINT:  Meningioma, Seizure   History of Present Illness:  Tara [name] is a 76 y.o. F with PMH significant for Anxiety, Depression, T2DM, HTN, HL, cardiomyopathy, Atrial Fibrillation on Eliquis, CKD IIIb, breast Ca s/p chemoradiation in 2008 who presented to the ED 6/24 with new onset seizures.   She had a known planum sphenoidal meningioma that was increased in size on CT with surrounding edema, so was transferred to [facility] for further evaluation.   LTM showed no further seizures.  She was evaluated by neurosurgery and underwent bifrontal craniotomy for meningioma resection on 7/3 and was transferred to intensive care post-op with PCCM consult while in ICU.    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   Significant Hospital Events: Including procedures, antibiotic start and stop dates in addition to other pertinent events   6/24 presented to [facility] with new onset seizures, CTH with enlarging meningioma with edema, loaded with Keppra 7/3 Underwent bifrontal craniotomy for meningioma, monitor in ICU 7/5 stable, no further seizure activity 7/6 less responsive, two episodes of vomiting overnight and increased oral secretions today.  Continuous blinking on exam. CTH  yesterday with new 7 mm thick subdural hematoma at the foramen magnum and tracking adjacent to the basion and odontoid not currently causing critical stenosis of the foramen magnum per radiology read, but merits surveillance. 7/7 Better night and more awake and responsive, repeat head CT unchanged and no seizures on video EEG  Interim History / Subjective:  No events overnight. Started on TF 7/7 after cortrack placement. EEG remains negative. D/C this AM   Objective   Blood pressure (!) 155/68, pulse 73, temperature 98.2 F (36.8 C), temperature source Axillary, resp. rate 15, height '4\' 9"'$  (1.448 m), weight 85 kg, SpO2 95 %.        Intake/Output Summary (Last 24 hours) at 03/29/2022 0930 Last data filed at 03/29/2022 0800 Gross per 24 hour  Intake 1411.18 ml  Output 1000 ml  Net 411.18 ml   Filed Weights   03/15/22 0130  Weight: 85 kg   General:  Older adult female, no distress noted  HEENT: L peri-orbital ecchymosis, facial edema > more significant on left side of face, Cortrack in place, dry MM, surgical sutures noted with dry blood  Neuro: arouses with verbal stimulation, pupils intact and reactive 4/5 motor throughout  CV: RRR, HR 76  PULM:  Clear breath sounds, no use of accessory muscles  GI: soft, active bowel sounds, non-tender  Extremities: warm/dry, edema noted to uppers and lowers  Skin: no rashes or lesions  Resolved Hospital Problem list   AKI  Assessment & Plan:   Sphenoidal Meningioma, enlarging with new vasogenic edema and new onset seizures Post-op day #5 s/p bifrontal craniotomy  EEG negative  Plan -close neuro monitoring in ICU, post-op plan per Neurosurgery -Neurology following. LTM D/C  -on Keppra and Vimpat, if seizures re-occur can increase Keppra -seizure precautions with  prn benzodiazepines -on decadron taper, continue '4mg'$  q12  Leukocytosis, Afebrile  Plan -Trend WBC and Fever Curve -Continue Unasyn for 5 days for concern of aspiration event    Thrombocytopenia  ABLA Acute on chronic thrombocytopenia Slow Hgb drift without evidence of bleeding Plan -continue to hold heparin: Hbg improving 8.1(7.9), PLTs improving 69(64)  -follow CBC, transfuse prn  HTN HFpEF, chronic Atrial Fibrillation Echo 6/9 with EF 55% and Grade I diastolic dysfunction Plan -holding Eliquis -Continue amiodarone and Coreg  -Continue home Crestor  -Obtain EKG >> history of prolonged QTC   Type 2 DM Plan -holding home Jardiance -SSI -Start semglee at 10 units daily -Change TF to Glucerna  -goal glucose 140-180  GERD Plan -Continue home PPI    Best Practice (right click and "Reselect all SmartList Selections" daily)   Diet/type: NPO DVT prophylaxis: SCD GI prophylaxis: N/A Lines: Central line, keep for poor peripheral access per Dr. Christella Noa Foley:  N/A Code Status:  full code Last date of multidisciplinary goals of care discussion [per primary, husband at bedside updated 7/7]  Labs   CBC: Recent Labs  Lab 03/25/22 0512 03/26/22 0529 03/27/22 0558 03/28/22 0500 03/29/22 0435  WBC 14.5* 11.9* 12.0* 11.1* 10.9*  HGB 10.5* 9.3* 8.8* 7.9* 8.1*  HCT 31.7* 28.3* 27.3* 24.4* 25.7*  MCV 84.8 86.0 87.8 87.8 89.2  PLT 101* 66* 66* 64* 69*    Basic Metabolic Panel: Recent Labs  Lab 03/25/22 0512 03/26/22 0529 03/27/22 0558 03/28/22 0500 03/28/22 1208 03/28/22 1624 03/29/22 0435  NA 138 139 138 142  --   --  142  K 4.6 4.1 4.0 3.8  --   --  4.0  CL 106 108 107 110  --   --  110  CO2 20* 21* 20* 21*  --   --  22  GLUCOSE 186* 227* 184* 121*  --   --  266*  BUN 27* '22 23 19  '$ --   --  27*  CREATININE 1.05* 1.02* 1.01* 0.99  --   --  0.93  CALCIUM 7.6* 7.7* 8.1* 7.9*  --   --  8.1*  MG 2.1 2.1 2.3 2.2  --  2.3 2.3  PHOS  --   --   --   --  3.3 3.3 2.8   GFR: Estimated Creatinine Clearance: 46.5 mL/min (by C-G formula based on SCr of 0.93 mg/dL). Recent Labs  Lab 03/26/22 0529 03/27/22 0558 03/28/22 0500 03/29/22 0435   WBC 11.9* 12.0* 11.1* 10.9*    Liver Function Tests: No results for input(s): "AST", "ALT", "ALKPHOS", "BILITOT", "PROT", "ALBUMIN" in the last 168 hours.  No results for input(s): "LIPASE", "AMYLASE" in the last 168 hours. No results for input(s): "AMMONIA" in the last 168 hours.  ABG    Component Value Date/Time   PHART 7.359 03/24/2022 1631   PCO2ART 40.6 03/24/2022 1631   PO2ART 236 (H) 03/24/2022 1631   HCO3 23.4 03/24/2022 1631   TCO2 25 03/24/2022 1631   ACIDBASEDEF 3.0 (H) 03/24/2022 1631   O2SAT 100 03/24/2022 1631     Coagulation Profile: No results for input(s): "INR", "PROTIME" in the last 168 hours.  Cardiac Enzymes: No results for input(s): "CKTOTAL", "CKMB", "CKMBINDEX", "TROPONINI" in the last 168 hours.  HbA1C: Hgb A1c MFr Bld  Date/Time Value Ref Range Status  02/27/2022 04:59 PM 6.3 (H) 4.8 - 5.6 % Final    Comment:    (NOTE) Pre diabetes:          5.7%-6.4%  Diabetes:              >6.4%  Glycemic control for   <7.0% adults with diabetes   01/12/2018 12:35 PM 6.0 (H) <5.7 % of total Hgb Final    Comment:    For someone without known diabetes, a hemoglobin  A1c value between 5.7% and 6.4% is consistent with prediabetes and should be confirmed with a  follow-up test. . For someone with known diabetes, a value <7% indicates that their diabetes is well controlled. A1c targets should be individualized based on duration of diabetes, age, comorbid conditions, and other considerations. . This assay result is consistent with an increased risk of diabetes. . Currently, no consensus exists regarding use of hemoglobin A1c for diagnosis of diabetes for children. .     CBG: Recent Labs  Lab 03/28/22 1604 03/28/22 1954 03/28/22 2306 03/29/22 0313 03/29/22 0744  GLUCAP 186* 218* 202* 227* 228*    Review of Systems:   Unable to obtain secondary to fatigue   Time Spent: 36 minutes     Hayden Pedro, AGACNP-BC [facility]  PCCM Pgr: [phone]

## 2022-03-29 NOTE — Progress Notes (Signed)
Patient progressing slowly following craniotomy for olfactory groove meningioma resection.  More awake today.  Following some simple commands..  No obvious seizure activity  Afebrile.  Vital signs are stable.  She awakens to voice.  She answers a few questions and says a few words.  Motor examination 5/5 bilaterally.  Visual acuity good bilaterally.  Wound healing well.  Progressing reasonably well following craniotomy and resection of subfrontal tumor.  Continue efforts at mobilization and seizure control.

## 2022-03-29 NOTE — Procedures (Signed)
Patient Name: Tara [name]  MRN: [id number]  Epilepsy Attending: Lora Havens  Referring Physician/Provider: Jacky Kindle, MD  Duration: 7/72023 1034 to 03/29/2022  0840   Patient history: 76 y.o. female with past medical history of anxiety, depression, GERD, hypertension, hyperlipidemia, nonischemic cardiomyopathy, atrial fibrillation on eliquis, Systolic HF, hx of breast cancer, meningioma who presented to the hospital on 03/15/2022 for evaluation of new onset seizures.   Level of alertness: Awake, asleep   AEDs during EEG study: LEV, LCM, Clonazepam   Technical aspects: This EEG study was done with scalp electrodes positioned according to the 10-20 International system of electrode placement. Electrical activity was acquired at a sampling rate of '500Hz'$  and reviewed with a high frequency filter of '70Hz'$  and a low frequency filter of '1Hz'$ . EEG data were recorded continuously and digitally stored.    Description: During awake state, no clear posterior dominant rhythm was seen. Sleep was characterized by sleep spindles (12-'14Hz'$ ), maximal frontocentral region. EEG also showed continuous generalized 3 to 5 Hz theta-delta slowing.  Hyperventilation and photic stimulation were not performed.      ABNORMALITY - Continuous slow, generalized   IMPRESSION: This study is suggestive of moderate diffuse encephalopathy, nonspecific etiology. No seizures or epileptiform discharges were seen throughout the recording.    Tara [name]

## 2022-03-30 ENCOUNTER — Inpatient Hospital Stay (HOSPITAL_COMMUNITY): Payer: Medicare Other

## 2022-03-30 DIAGNOSIS — R569 Unspecified convulsions: Secondary | ICD-10-CM | POA: Diagnosis not present

## 2022-03-30 LAB — MAGNESIUM
Magnesium: 2.4 mg/dL (ref 1.7–2.4)
Magnesium: 2.4 mg/dL (ref 1.7–2.4)

## 2022-03-30 LAB — GLUCOSE, CAPILLARY
Glucose-Capillary: 212 mg/dL — ABNORMAL HIGH (ref 70–99)
Glucose-Capillary: 133 mg/dL — ABNORMAL HIGH (ref 70–99)
Glucose-Capillary: 113 mg/dL — ABNORMAL HIGH (ref 70–99)
Glucose-Capillary: 120 mg/dL — ABNORMAL HIGH (ref 70–99)
Glucose-Capillary: 131 mg/dL — ABNORMAL HIGH (ref 70–99)
Glucose-Capillary: 120 mg/dL — ABNORMAL HIGH (ref 70–99)

## 2022-03-30 LAB — BASIC METABOLIC PANEL
Anion gap: 8 (ref 5–15)
BUN: 33 mg/dL — ABNORMAL HIGH (ref 8–23)
CO2: 24 mmol/L (ref 22–32)
Calcium: 8 mg/dL — ABNORMAL LOW (ref 8.9–10.3)
Chloride: 111 mmol/L (ref 98–111)
Creatinine, Ser: 0.9 mg/dL (ref 0.44–1.00)
GFR, Estimated: >60 mL/min (ref >60)
Glucose, Bld: 169 mg/dL — ABNORMAL HIGH (ref 70–99)
Potassium: 3.8 mmol/L (ref 3.5–5.1)
Sodium: 143 mmol/L (ref 135–145)

## 2022-03-30 LAB — CBC
HCT: 25.4 % — ABNORMAL LOW (ref 36.0–46.0)
Hemoglobin: 7.8 g/dL — ABNORMAL LOW (ref 12.0–15.0)
MCH: 27.5 pg (ref 26.0–34.0)
MCHC: 30.7 g/dL (ref 30.0–36.0)
MCV: 89.4 fL (ref 80.0–100.0)
Platelets: 74 10*3/uL — ABNORMAL LOW (ref 150–400)
RBC: 2.84 MIL/uL — ABNORMAL LOW (ref 3.87–5.11)
RDW: 17.4 % — ABNORMAL HIGH (ref 11.5–15.5)
WBC: 11 10*3/uL — ABNORMAL HIGH (ref 4.0–10.5)
nRBC: 0.2 % (ref 0.0–0.2)

## 2022-03-30 LAB — PHOSPHORUS
Phosphorus: 2.9 mg/dL (ref 2.5–4.6)
Phosphorus: 3.5 mg/dL (ref 2.5–4.6)

## 2022-03-30 MED ORDER — BISACODYL 10 MG RE SUPP
10.0000 mg | Freq: Every day | RECTAL | Status: DC | PRN
Start: 2022-03-30 — End: 2022-06-10
  Administered 2022-03-31: 10 mg via RECTAL
  Filled 2022-03-30 (×2): qty 1

## 2022-03-30 MED ORDER — POLYETHYLENE GLYCOL 3350 17 G PO PACK
17.0000 g | PACK | Freq: Every day | ORAL | Status: DC
  Administered 2022-03-30 – 2022-05-03 (×18): 17 g via NASOGASTRIC
  Filled 2022-03-30 (×21): qty 1

## 2022-03-30 MED ORDER — POLYETHYLENE GLYCOL 3350 17 G PO PACK
17.0000 g | PACK | Freq: Every day | ORAL | Status: DC
  Filled 2022-03-30: qty 1

## 2022-03-30 NOTE — Progress Notes (Signed)
Inpatient Rehab Admissions Coordinator:   Per therapy recommendations, patient was screened for CIR candidacy by Clemens Catholic, MS, CCC-SLP.  At this time, Pt. is remains total A and is not yet tolerating Oob.  She does not appear to be at a level where I believe she could tolerate the intensity of CIR. Pt. may have potential to progress to becoming a potential CIR candidate, so CIR admissions team will follow and monitor for progress and participation with therapies and place consult order if Pt. appears to be an appropriate candidate. Please contact me with any questions.   Clemens Catholic, [place], [place] Admissions Coordinator  [phone] ([place]) [phone] (office)

## 2022-03-30 NOTE — Progress Notes (Signed)
NAME:  Tara [name], MRN:  [id number], DOB:  02-Aug-1946, LOS: 52 ADMISSION DATE:  03/15/2022, CONSULTATION DATE:  03/30/22 REFERRING MD:  Christella Noa, CHIEF COMPLAINT:  Meningioma, Seizure   History of Present Illness:  Tara [name] is a 76 y.o. F with PMH significant for Anxiety, Depression, T2DM, HTN, HL, cardiomyopathy, Atrial Fibrillation on Eliquis, CKD IIIb, breast Ca s/p chemoradiation in 2008 who presented to the ED 6/24 with new onset seizures.   She had a known planum sphenoidal meningioma that was increased in size on CT with surrounding edema, so was transferred to [facility] for further evaluation.  LTM showed no further seizures.  She was evaluated by neurosurgery and underwent bifrontal craniotomy for meningioma resection on 7/3 and was transferred to intensive care post-op with PCCM consult while in ICU.    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   Significant Hospital Events: Including procedures, antibiotic start and stop dates in addition to other pertinent events   6/24 presented to [facility] ED with new onset seizures, CTH with enlarging meningioma with edema, loaded with Keppra 7/3 Underwent bifrontal craniotomy for meningioma, monitor in ICU 7/5 stable, no further seizure activity 7/6 less responsive, two episodes of vomiting overnight and increased oral secretions today.  Continuous blinking on exam. CTH  yesterday with new 7 mm thick subdural hematoma at the foramen magnum and tracking adjacent to the basion and odontoid not currently causing critical stenosis of the foramen magnum per radiology read, but merits surveillance. 7/7 Better night and more awake and responsive, repeat head CT unchanged and no seizures on video EEG 7/9 Recurrent seizure overnight requiring '4mg'$  of IV ativan to break. This am less responsive with forced right gaze   Interim History / Subjective:  Minimally responsive this am   Objective   Blood pressure (!) 148/72, pulse 75, temperature 98.6 F (37 C), temperature source Oral, resp. rate 17, height '4\' 9"'$  (1.448 m), weight 85 kg, SpO2 94 %.        Intake/Output Summary (Last 24 hours) at 03/30/2022 9735 Last data filed at 03/30/2022 0600 Gross per 24 hour  Intake 1512.34 ml  Output 600 ml  Net 912.34 ml    Filed Weights   03/15/22 0130  Weight: 85 kg   Physical Exam  General: Acute on chronic ill-appearing elderly female lying in bed in no acute distress HEENT: Severe forced right gaze unable to assess pupils Neuro: Minimally responsive, no external signs of seizure activity CV: s1s2 regular rate and rhythm, no murmur, rubs, or gallops,  PULM: Clear to auscultation bilaterally, no increased work of breathing, no added breath sounds GI: soft, bowel sounds active in all 4 quadrants, non-tender, non-distended Extremities: warm/dry, generalized edema  Skin: no rashes or lesions  Resolved Hospital Problem list   AKI  Assessment & Plan:   Sphenoidal Meningioma, enlarging with new vasogenic edema and new onset seizures -Post-op day #5 s/p bifrontal craniotomy, EEG negative  -Recurrent seizure overnight 7/9 required '4mg'$ s of ativan to stop  P: Primary management per NSGY  Maintain neuro protective measures; goal for eurothermia,  euglycemia, eunatermia, normoxia, and PCO2 goal of 35-40 Nutrition and bowel regiment  Seizure precautions with PRN  benzodiazepines Decadron taper in place   AEDs per NSGY Aspirations precautions   Leukocytosis, Afebrile  P: Continue to trend CBC and fever curve  Remains on Unasyn with stop date in place   Thrombocytopenia - slowly improving  ABLA -Acute on chronic thrombocytopenia -Slow Hgb drift without evidence of bleeding P: Continue to trend CBC  Monitor for signs of bleeding  Transfuse per protocol  Heparin remains on hold   HTN HFpEF, chronic Atrial Fibrillation -Echo 6/9 with EF 55% and Grade I diastolic dysfunction P: Continuous telemetry  Strict intake and output  Daily weight to assess volume status Closely monitor renal function and electrolytes  Anticoagulation remains on hold Continue Amiodarone, Coreg and Crestor   Type 2 DM P: Continue SSI TF on hold post seizures, therefore long acting insulin is on hold as well  CBG goal 140-180  GERD P: Continue PPI   Best Practice (right click and "Reselect all SmartList Selections" daily)   Diet/type: NPO DVT prophylaxis: SCD GI prophylaxis: N/A Lines: Central line, keep for poor peripheral access per Dr. Christella Noa Foley:  N/A Code Status:  full code Last date of multidisciplinary goals of care discussion: Discussion per primary   Critical care: NA  Reyce Lubeck D. Kenton Kingfisher, NP-C [facility] Personal contact information can be found on Amion  03/30/2022, 9:33 AM

## 2022-03-30 NOTE — Progress Notes (Signed)
Patient started seizing at 2120. Patient was smacking lips together. '2mg'$  of ativan given with no improvement. Additional '2mg'$  given. Neurosurgery notified.

## 2022-03-30 NOTE — Progress Notes (Signed)
Continue to hold TF at this time per CCM.  Insulin doses also held at this time d/t NPO status and CBG's trending down.

## 2022-03-30 NOTE — Progress Notes (Signed)
Patient developed right-sided gaze deviation overnight.  This did not respond to 2 different trials of Ativan.  No evidence of seizure activity otherwise.  She is afebrile.  Her vital signs are stable.  She awakens to voice.  She appears aware.  She answers some simple questions.  She follows commands with both upper and lower extremities although she still has some left-sided weakness.  Follow-up head CT scan demonstrates postoperative change without evidence of new infarct, hemorrhage or new structural issue.  Status post bifrontal craniotomy and resection of subfrontal tumor.  Patient with rightward gaze preference which is worrisome for seizure activity although previous EEG monitoring did not demonstrate any obvious seizure activity.  Certainly her ability to awaken and follow commands argues against status epilepticus..  At this point I think we will just continue with supportive efforts.

## 2022-03-31 DIAGNOSIS — R569 Unspecified convulsions: Secondary | ICD-10-CM | POA: Diagnosis not present

## 2022-03-31 LAB — BASIC METABOLIC PANEL
Anion gap: 10 (ref 5–15)
BUN: 32 mg/dL — ABNORMAL HIGH (ref 8–23)
CO2: 23 mmol/L (ref 22–32)
Calcium: 7.8 mg/dL — ABNORMAL LOW (ref 8.9–10.3)
Chloride: 110 mmol/L (ref 98–111)
Creatinine, Ser: 0.89 mg/dL (ref 0.44–1.00)
GFR, Estimated: >60 mL/min (ref >60)
Glucose, Bld: 138 mg/dL — ABNORMAL HIGH (ref 70–99)
Potassium: 4.1 mmol/L (ref 3.5–5.1)
Sodium: 143 mmol/L (ref 135–145)

## 2022-03-31 LAB — GLUCOSE, CAPILLARY
Glucose-Capillary: 113 mg/dL — ABNORMAL HIGH (ref 70–99)
Glucose-Capillary: 135 mg/dL — ABNORMAL HIGH (ref 70–99)
Glucose-Capillary: 149 mg/dL — ABNORMAL HIGH (ref 70–99)
Glucose-Capillary: 172 mg/dL — ABNORMAL HIGH (ref 70–99)
Glucose-Capillary: 182 mg/dL — ABNORMAL HIGH (ref 70–99)
Glucose-Capillary: 180 mg/dL — ABNORMAL HIGH (ref 70–99)

## 2022-03-31 LAB — CBC
HCT: 25.7 % — ABNORMAL LOW (ref 36.0–46.0)
Hemoglobin: 8 g/dL — ABNORMAL LOW (ref 12.0–15.0)
MCH: 28.2 pg (ref 26.0–34.0)
MCHC: 31.1 g/dL (ref 30.0–36.0)
MCV: 90.5 fL (ref 80.0–100.0)
Platelets: 79 10*3/uL — ABNORMAL LOW (ref 150–400)
RBC: 2.84 MIL/uL — ABNORMAL LOW (ref 3.87–5.11)
RDW: 17.5 % — ABNORMAL HIGH (ref 11.5–15.5)
WBC: 9.9 10*3/uL (ref 4.0–10.5)
nRBC: 0 % (ref 0.0–0.2)

## 2022-03-31 LAB — MAGNESIUM: Magnesium: 2.4 mg/dL (ref 1.7–2.4)

## 2022-03-31 LAB — PHOSPHORUS: Phosphorus: 3.6 mg/dL (ref 2.5–4.6)

## 2022-03-31 MED ORDER — FUROSEMIDE 10 MG/ML IJ SOLN
40.0000 mg | Freq: Once | INTRAMUSCULAR | Status: AC
  Administered 2022-03-31: 40 mg via INTRAVENOUS
  Filled 2022-03-31: qty 4

## 2022-03-31 MED ORDER — DEXAMETHASONE SODIUM PHOSPHATE 4 MG/ML IJ SOLN
4.0000 mg | Freq: Every day | INTRAMUSCULAR | Status: DC
  Administered 2022-03-31 – 2022-04-04 (×5): 4 mg via INTRAVENOUS
  Filled 2022-03-31 (×4): qty 1

## 2022-03-31 NOTE — Progress Notes (Signed)
Patient ID: Tara [name], female   DOB: 12/25/1945, 76 y.o.   MRN: [id number] BP (!) 143/82   Pulse 76   Temp (!) 97.1 F (36.2 C) (Axillary)   Resp (!) 24   Ht '4\' 9"'$  (1.448 m)   Wt 85 kg   SpO2 92%   BMI 40.55 kg/m  Lethargic, follows commands Moving all extremities Wound is clean, dry, no signs of infection Seizure free today. No etiology other than cerebrum disturbed by the resection.

## 2022-03-31 NOTE — Progress Notes (Signed)
Occupational Therapy Treatment Patient Details Name: Tara [name] MRN: [id number] DOB: September 19, 1946 Today's Date: 03/31/2022   History of present illness 76 yo woman admitted 03/15/22 with new onset seizures related to meningioma. s/p tumor resection 7/3. Recent DC from [facility] 6/22. PMH: CKD stage 3, HFpEF, a fib,  meningioma.   OT comments  Pt seen with PT. Total A +2 with all bed mobility and ADL. No initiation, no command follow, no initiation of BUE movement during session. Able to verbalize "yes" a few times during session. If level of arousal improves so that pt can participate with therapy, recommend AIR, however at this time recommend SNF. Pt's husband did not previously agree with SNF. VSS. Acute OT will continue to follow.    Recommendations for follow up therapy are one component of a multi-disciplinary discharge planning process, led by the attending physician.  Recommendations may be updated based on patient status, additional functional criteria and insurance authorization.    Follow Up Recommendations  Skilled nursing-short term rehab (<3 hours/day) (unless husband can provide level of care, including use of hoyer for transfers)    Assistance Recommended at Discharge Frequent or constant Supervision/Assistance  Patient can return home with the following  Two people to help with walking and/or transfers;Two people to help with bathing/dressing/bathroom;Direct supervision/assist for medications management;Assistance with feeding;Assistance with cooking/housework;Direct supervision/assist for financial management;Assist for transportation;Help with stairs or ramp for entrance   Equipment Recommendations       Recommendations for Other Services      Precautions / Restrictions Precautions Precautions: Fall Precaution Comments: LUE edema; seizures Restrictions Weight Bearing Restrictions: No       Mobility Bed Mobility               General bed mobility comments: total  A +2 all bed mobility; no initiation    Transfers                   General transfer comment: deferred     Balance     Sitting balance-Leahy Scale: Zero                                     ADL either performed or assessed with clinical judgement   ADL                                         General ADL Comments: total A all ADL    Extremity/Trunk Assessment Upper Extremity Assessment Upper Extremity Assessment:  (not moving either arm spontaneously; edematous B hands)   Lower Extremity Assessment Lower Extremity Assessment: Defer to PT evaluation        Vision   Vision Assessment?: Vision impaired- to be further tested in functional context Additional Comments: eyes closed 95% of session; wehn opened - L gaze   Perception     Praxis      Cognition Arousal/Alertness: Lethargic Behavior During Therapy: Flat affect Overall Cognitive Status: Impaired/Different from baseline                                 General Comments: not following commands; saying yes to a few quesitons; eye closed throughout; rigid; no initiation of movement; incontinent        Exercises General Exercises -  Upper Extremity Shoulder Flexion: PROM, 5 reps, Both Elbow Flexion: PROM, Both, 5 reps Elbow Extension: PROM, Both, 5 reps    Shoulder Instructions       General Comments      Pertinent Vitals/ Pain       Pain Assessment Pain Assessment: Faces Faces Pain Scale: Hurts little more Pain Location: generalized Pain Descriptors / Indicators: Grimacing, Moaning Pain Intervention(s): Limited activity within patient's tolerance  Home Living                                          Prior Functioning/Environment              Frequency  Min 2X/week        Progress Toward Goals  OT Goals(current goals can now be found in the care plan section)  Progress towards OT goals: Not progressing toward goals  - comment;OT to reassess next treatment  Acute Rehab OT Goals Patient Stated Goal: unable OT Goal Formulation: Patient unable to participate in goal setting Time For Goal Achievement: 04/12/22 Potential to Achieve Goals: Fair ADL Goals Pt Will Perform Grooming: with min assist;sitting Pt Will Perform Upper Body Bathing: with min assist;sitting Pt Will Perform Lower Body Bathing: with mod assist;sit to/from stand Pt Will Perform Lower Body Dressing: with modified independence;sitting/lateral leans;sit to/from stand Pt Will Transfer to Toilet: with mod assist;stand pivot transfer;bedside commode Pt Will Perform Toileting - Clothing Manipulation and hygiene: with modified independence;sitting/lateral leans;sit to/from stand Additional ADL Goal #1: Pt will visually locate at least 3 objects in the room with minimal directional cues  Plan Discharge plan needs to be updated    Co-evaluation    PT/OT/SLP Co-Evaluation/Treatment: Yes Reason for Co-Treatment: Complexity of the patient's impairments (multi-system involvement);Necessary to address cognition/behavior during functional activity;For patient/therapist safety   OT goals addressed during session: ADL's and self-care      AM-PAC OT "6 Clicks" Daily Activity     Outcome Measure   Help from another person eating meals?: Total Help from another person taking care of personal grooming?: Total Help from another person toileting, which includes using toliet, bedpan, or urinal?: Total Help from another person bathing (including washing, rinsing, drying)?: Total Help from another person to put on and taking off regular upper body clothing?: Total Help from another person to put on and taking off regular lower body clothing?: Total 6 Click Score: 6    End of Session    OT Visit Diagnosis: Unsteadiness on feet (R26.81);Other abnormalities of gait and mobility (R26.89);Muscle weakness (generalized) (M62.81);Low vision, both eyes  (H54.2);Other symptoms and signs involving cognitive function;Pain   Activity Tolerance Patient limited by lethargy   Patient Left in bed;with call bell/phone within reach;with bed alarm set;Other (comment) (chair position)   Nurse Communication Mobility status        Time: 1306-1330 OT Time Calculation (min): 24 min  Charges: OT General Charges $OT Visit: 1 Visit OT Evaluation $OT Eval Moderate Complexity: [place], OT/L   Acute OT Clinical Specialist Acute Rehabilitation Services Pager [phone] Office [phone]   [facility] 03/31/2022, 2:36 PM

## 2022-03-31 NOTE — Progress Notes (Signed)
Physical Therapy Treatment Patient Details Name: Tara [name] MRN: [id number] DOB: 11/18/1945 Today's Date: 03/31/2022   History of Present Illness 76 yo woman admitted 03/15/22 with new onset seizures related to meningioma. s/p tumor resection 7/3. Recent DC from [facility] 6/22. PMH: CKD stage 3, HFpEF, a fib,  meningioma.    PT Comments    Pt seen with OT. Total A +2 with all bed mobility and ADL. No initiation of movement, kept eyes closed most of session, no command follow, no initiation of BUE movement during session. Able to verbalize "yes" and "bye" a few times during session. If level of arousal improves so that pt can participate with therapy, recommend AIR, however at this time recommend SNF. Pt's husband did not previously agree with SNF.  Will continue to follow pt acutely and progress pt as able.   Recommendations for follow up therapy are one component of a multi-disciplinary discharge planning process, led by the attending physician.  Recommendations may be updated based on patient status, additional functional criteria and insurance authorization.  Follow Up Recommendations  Skilled nursing-short term rehab (<3 hours/day) Can patient physically be transported by private vehicle: No   Assistance Recommended at Discharge Frequent or constant Supervision/Assistance  Patient can return home with the following Two people to help with walking and/or transfers;Two people to help with bathing/dressing/bathroom;Assistance with cooking/housework;Direct supervision/assist for medications management;Help with stairs or ramp for entrance;Direct supervision/assist for financial management;Assist for transportation;Assistance with feeding   Equipment Recommendations  Wheelchair (measurements PT);Wheelchair cushion (measurements PT);Hospital bed;Other (comment) (lift equipment; pending progress)    Recommendations for Other Services       Precautions / Restrictions Precautions Precautions:  Fall Precaution Comments: LUE edema; seizures Restrictions Weight Bearing Restrictions: No     Mobility  Bed Mobility               General bed mobility comments: total A +2 all bed mobility; no initiation.  Cleaned pt at end of treatment with no assist for rolling.    Transfers                   General transfer comment: deferred    Ambulation/Gait                   Stairs             Wheelchair Mobility    Modified Rankin (Stroke Patients Only) Modified Rankin (Stroke Patients Only) Pre-Morbid Rankin Score: Moderately severe disability Modified Rankin: Severe disability     Balance Overall balance assessment: Needs assistance Sitting-balance support: Single extremity supported, Bilateral upper extremity supported, Feet supported Sitting balance-Leahy Scale: Zero Sitting balance - Comments: Pt able to sit statically EOB with total assist most of the 10 min at EOB.  Pt had a brief episode where she sat leaning slightly to her left with  min guard assist, but began to demonstrate trunk instability with fatigue, needing up to max assist to prevent LOB Postural control: Posterior lean, Left lateral lean Standing balance support: Bilateral upper extremity supported, Single extremity supported, During functional activity Standing balance-Leahy Scale: Poor Standing balance comment: deferred                            Cognition Arousal/Alertness: Lethargic Behavior During Therapy: Flat affect Overall Cognitive Status: Impaired/Different from baseline  General Comments: not following commands; saying yes to a few quesitons; eye closed throughout; rigid; no initiation of movement; incontinent        Exercises General Exercises - Lower Extremity Ankle Circles/Pumps: AROM, Both, 10 reps, Seated Heel Slides: PROM, Both, 5 reps, Supine    General Comments General comments (skin integrity,  edema, etc.): VSS, left pt in close to full chair position at end of treatment      Pertinent Vitals/Pain Pain Assessment Pain Assessment: Faces Faces Pain Scale: Hurts little more Pain Location: generalized Pain Descriptors / Indicators: Grimacing, Moaning Pain Intervention(s): Limited activity within patient's tolerance, Monitored during session, Repositioned    Home Living                          Prior Function            PT Goals (current goals can now be found in the care plan section) Acute Rehab PT Goals Patient Stated Goal: did not state Progress towards PT goals: Not progressing toward goals - comment (lethargic and poor participation)    Frequency    Min 3X/week      PT Plan Frequency needs to be updated;Discharge plan needs to be updated    Co-evaluation PT/OT/SLP Co-Evaluation/Treatment: Yes Reason for Co-Treatment: Complexity of the patient's impairments (multi-system involvement);For patient/therapist safety PT goals addressed during session: Mobility/safety with mobility OT goals addressed during session: ADL's and self-care      AM-PAC PT "6 Clicks" Mobility   Outcome Measure  Help needed turning from your back to your side while in a flat bed without using bedrails?: Total Help needed moving from lying on your back to sitting on the side of a flat bed without using bedrails?: Total Help needed moving to and from a bed to a chair (including a wheelchair)?: Total Help needed standing up from a chair using your arms (e.g., wheelchair or bedside chair)?: Total Help needed to walk in hospital room?: Total Help needed climbing 3-5 steps with a railing? : Total 6 Click Score: 6    End of Session   Activity Tolerance: Patient limited by lethargy Patient left: in bed;with call bell/phone within reach;with bed alarm set Nurse Communication: Mobility status;Need for lift equipment PT Visit Diagnosis: Unsteadiness on feet (R26.81);Muscle  weakness (generalized) (M62.81);Other symptoms and signs involving the nervous system (R29.898);Other abnormalities of gait and mobility (R26.89);Difficulty in walking, not elsewhere classified (R26.2)     Time: 4081-4481 PT Time Calculation (min) (ACUTE ONLY): 23 min  Charges:  $Therapeutic Activity: 8-22 mins                     [facility] M,PT Acute Rehab Services [phone]    Alvira Philips 03/31/2022, 3:55 PM

## 2022-03-31 NOTE — Progress Notes (Signed)
Speech Language Pathology Treatment: Dysphagia  Patient Details Name: Tara [name] MRN: [id number] DOB: 1946/07/18 Today's Date: 03/31/2022 Time: 1009-1020 SLP Time Calculation (min) (ACUTE ONLY): 11 min  Assessment / Plan / Recommendation Clinical Impression  Pt demonstrates improving arousal, but still struggles to sustain attention to PO trials. SLP able to stimulate pt long enough to respond to orientation x2 correctly, follow 2 oral motor commands and accept ice. Pt does seem to drift off while trying ice chips and does not immediately swallow when cued. Pt is not ready for increase oral intake. Arousal needs to improve further. Will continue efforts.   HPI HPI: Pt is a 76 y/o who presented 6/24 with new onset seizures. 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 EEG negative for seizures. Neurosurgey consulted and did not recommend meningioma resection as of 6/27. Decline in swallow function noted overnight 6/27. Dx acute toxic metabolic encephalopathy suspected secondary to keppra. Pt s/p Bifrontal craniotomy for resection of meningioma 7/3. PMH: HTN, HLD, GERD, CKD stage IIIb, chronic HFpEF, meningioma, breast CA-s/p right lumpectomy and chemoradiation in 2008, depression/anxiety. BSE 03/11/22: regular texture diet and thin liquids without need for follow up.      SLP Plan  Continue with current plan of care      Recommendations for follow up therapy are one component of a multi-disciplinary discharge planning process, led by the attending physician.  Recommendations may be updated based on patient status, additional functional criteria and insurance authorization.    Recommendations  Diet recommendations: NPO                Oral Care Recommendations: Oral care BID Plan: Continue with current plan of care           Kiela Shisler, [name]  03/31/2022, 10:23 AM

## 2022-03-31 NOTE — Progress Notes (Signed)
NAME:  Tara [name], MRN:  [id number], DOB:  [birth year], LOS: 17 ADMISSION DATE:  03/15/2022, CONSULTATION DATE:  03/31/22 REFERRING MD:  Christella Noa, CHIEF COMPLAINT:  Meningioma, Seizure   History of Present Illness:  Tara [name] is a 76 y.o. F with PMH significant for Anxiety, Depression, T2DM, HTN, HL, cardiomyopathy, Atrial Fibrillation on Eliquis, CKD IIIb, breast Ca s/p chemoradiation in 2008 who presented to the ED 6/24 with new onset seizures.   She had a known planum sphenoidal meningioma that was increased in size on CT with surrounding edema, so was transferred to [facility] for further evaluation.  LTM showed no further seizures.  She was evaluated by neurosurgery and underwent bifrontal craniotomy for meningioma resection on 7/3 and was transferred to intensive care post-op with PCCM consult while in ICU.    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   Significant Hospital Events: Including procedures, antibiotic start and stop dates in addition to other pertinent events   6/24 presented to [facility] ED with new onset seizures, CTH with enlarging meningioma with edema, loaded with Keppra 7/3 Underwent bifrontal craniotomy for meningioma, monitor in ICU 7/5 stable, no further seizure activity 7/6 less responsive, two episodes of vomiting overnight and increased oral secretions today.  Continuous blinking on exam. CTH  yesterday with new 7 mm thick subdural hematoma at the foramen magnum and tracking adjacent to the basion and odontoid not currently causing critical stenosis of the foramen magnum per radiology read, but merits surveillance. 7/7 Better night and more awake and responsive, repeat head CT unchanged and no seizures on video EEG 7/9 Recurrent seizure overnight requiring '4mg'$  of IV ativan to break. This am less responsive with forced right gaze  7/10 no further seizure activity, awake, responding, no forced gaze  Interim History / Subjective:  No overnight events More awake today Remains on Keppra and Vimpat 1.1L UOP yesterday and -3.5L since admission  Objective   Blood pressure (!) 146/70, pulse 67, temperature 97.7 F (36.5 C), temperature source Axillary, resp. rate 12, height '4\' 9"'$  (1.448 m), weight 85 kg, SpO2 93 %.        Intake/Output Summary (Last 24 hours) at 03/31/2022 0803 Last data filed at 03/31/2022 0530 Gross per 24 hour  Intake 400 ml  Output 1100 ml  Net -700 ml    Filed Weights   03/15/22 0130  Weight: 85 kg     General:  ill-appearing elderly F, sleeping in no distress HEENT: MM pink/moist, craniotomy incision without erythema, healing well, no gaze deviation, pupils equal and responsive, improving L peri-orbital ecchymosis Neuro: fatigued, slow to respond but will answer some questions with one word answers, no gaze deviation, intermittently moving extremities to command  CV: s1s2 rrr, no m/r/g PULM:  lung clear on RA, no rhonchi or wheezing GI: soft, non-distended, non-tender Extremities: warm/dry, 1+ generalized edema  Skin: no rashes or lesions  Labs reviewed: Hgb 8.0 Creatinine 0.89 Glu 138  Resolved Hospital Problem list   AKI  Assessment & Plan:   Sphenoidal Meningioma, enlarging with new vasogenic edema and new onset  seizures Post-op day #6 s/p bifrontal craniotomy, EEG negative  Recurrent seizure overnight 7/9 required '4mg'$ s of ativan to stop,  none since then Repeat CTH's have been without change -remains in ICU for close neuro monitoring  -Primary management per NSGY  -Maintain neuro protective measures; goal for eurothermia, euglycemia, eunatremia, normoxia, and PCO2 goal of 35-40 -Nutrition and bowel regiment  -Seizure precautions with PRN benzodiazepines -Decadron taper in place   -AEDs per NSGY -Aspirations precautions   Leukocytosis, Afebrile  WBC improved today to 9.9 -likely reactive vs aspiration pna 2/2 vomiting -Continue to trend CBC and fever curve  -Remains on Unasyn with stop date in place   Thrombocytopenia - slowly improving  ABLA Acute on chronic thrombocytopenia, platelets up to 79 today Slow Hgb drift without evidence of bleeding -Continue to trend CBC  -Monitor for signs of bleeding  T-ransfuse per protocol  -Heparin remains on hold   HTN HFpEF, chronic Atrial Fibrillation Echo 6/9 with EF 55% and Grade I diastolic dysfunction -Continuous telemetry  -Strict intake and output  -Daily weight to assess volume status -Closely monitor renal function and electrolytes  -Anticoagulation remains on hold -Continue Amiodarone, Coreg and Crestor   Type 2 DM -Continue SSI -TF held yesterday after seizures, mental status improved today so will resume -CBG goal 140-180  GERD P: Continue PPI   Best Practice (right click and "Reselect all SmartList Selections" daily)   Diet/type: tubefeeds DVT prophylaxis: SCD GI prophylaxis: N/A Lines: Central line, keep for poor peripheral access per Dr. Christella Noa Foley:  N/A Code Status:  full code Last date of multidisciplinary goals of care discussion: Discussion per primary, husband updated at the bedside  Critical care: NA   Otilio Carpen Miriam Liles, PA-C [facility] See Amion for pager If no response to pager , please call [phone] until 7pm After 7:00 pm call Elink  [phone]?[place]

## 2022-04-01 ENCOUNTER — Inpatient Hospital Stay (HOSPITAL_COMMUNITY): Payer: Medicare Other

## 2022-04-01 DIAGNOSIS — R569 Unspecified convulsions: Secondary | ICD-10-CM | POA: Diagnosis not present

## 2022-04-01 LAB — BASIC METABOLIC PANEL
Anion gap: 12 (ref 5–15)
BUN: 42 mg/dL — ABNORMAL HIGH (ref 8–23)
CO2: 26 mmol/L (ref 22–32)
Calcium: 8 mg/dL — ABNORMAL LOW (ref 8.9–10.3)
Chloride: 106 mmol/L (ref 98–111)
Creatinine, Ser: 1.16 mg/dL — ABNORMAL HIGH (ref 0.44–1.00)
GFR, Estimated: 49 mL/min — ABNORMAL LOW (ref >60)
Glucose, Bld: 164 mg/dL — ABNORMAL HIGH (ref 70–99)
Potassium: 3.5 mmol/L (ref 3.5–5.1)
Sodium: 144 mmol/L (ref 135–145)

## 2022-04-01 LAB — CBC
HCT: 25.5 % — ABNORMAL LOW (ref 36.0–46.0)
Hemoglobin: 8.1 g/dL — ABNORMAL LOW (ref 12.0–15.0)
MCH: 28.2 pg (ref 26.0–34.0)
MCHC: 31.8 g/dL (ref 30.0–36.0)
MCV: 88.9 fL (ref 80.0–100.0)
Platelets: 86 10*3/uL — ABNORMAL LOW (ref 150–400)
RBC: 2.87 MIL/uL — ABNORMAL LOW (ref 3.87–5.11)
RDW: 17.2 % — ABNORMAL HIGH (ref 11.5–15.5)
WBC: 9.9 10*3/uL (ref 4.0–10.5)
nRBC: 0.4 % — ABNORMAL HIGH (ref 0.0–0.2)

## 2022-04-01 LAB — GLUCOSE, CAPILLARY
Glucose-Capillary: 161 mg/dL — ABNORMAL HIGH (ref 70–99)
Glucose-Capillary: 145 mg/dL — ABNORMAL HIGH (ref 70–99)
Glucose-Capillary: 145 mg/dL — ABNORMAL HIGH (ref 70–99)
Glucose-Capillary: 185 mg/dL — ABNORMAL HIGH (ref 70–99)
Glucose-Capillary: 189 mg/dL — ABNORMAL HIGH (ref 70–99)
Glucose-Capillary: 172 mg/dL — ABNORMAL HIGH (ref 70–99)

## 2022-04-01 MED ORDER — LEVETIRACETAM 100 MG/ML PO SOLN
750.0000 mg | Freq: Two times a day (BID) | ORAL | Status: DC
Start: 2022-04-01 — End: 2022-04-02
  Administered 2022-04-01 – 2022-04-02 (×2): 750 mg
  Filled 2022-04-01: qty 10

## 2022-04-01 MED ORDER — GLUCERNA 1.5 CAL PO LIQD
1000.0000 mL | ORAL | Status: DC
  Administered 2022-04-02 – 2022-04-04 (×2): 1000 mL
  Filled 2022-04-01 (×4): qty 1000

## 2022-04-01 MED ORDER — ORAL CARE MOUTH RINSE: 15.0000 mL | OROMUCOSAL | Status: DC | PRN

## 2022-04-01 MED ORDER — ORAL CARE MOUTH RINSE
15.0000 mL | OROMUCOSAL | Status: DC
  Administered 2022-04-01 – 2022-06-10 (×259): 15 mL via OROMUCOSAL

## 2022-04-01 NOTE — Progress Notes (Signed)
SLP Cancellation Note  Patient Details Name: Tara [name] MRN: [id number] DOB: 06/09/1946   Cancelled treatment:       Reason Eval/Treat Not Completed: Fatigue/lethargy limiting ability to participate. Pt still not responsive enough for PO trials. Will f/u.    Hue Steveson, [name] 04/01/2022, 12:34 PM

## 2022-04-01 NOTE — Progress Notes (Signed)
EEG completed, results pending. 

## 2022-04-01 NOTE — Progress Notes (Signed)
Subjective: Had another episode decreased responsiveness with left gaze deviation and tachypnea (respiratory rate in the 30s per sister at bedside).    ROS: Unable to obtain due to poor mental status  Examination  Vital signs in last 24 hours: Temp:  [96.4 F (35.8 C)-98.1 F (36.7 C)] 96.4 F (35.8 C) (07/11 0749) Pulse Rate:  [61-76] 61 (07/11 1200) Resp:  [11-36] 22 (07/11 1200) BP: (127-146)/(57-82) 128/70 (07/11 1200) SpO2:  [90 %-95 %] 93 % (07/11 1200)  General: lying in bed, not in apparent distress  Neuro: Opens eyes to noxious stimuli but does not follow commands, PERLA, left gaze deviation, unable to overcome with oculocephalics, does not withdraw to noxious stimuli in all 4 extremities  Basic Metabolic Panel: Recent Labs  Lab 03/28/22 0500 03/28/22 1208 03/29/22 0435 03/29/22 1643 03/30/22 0600 03/30/22 1727 03/31/22 0559 04/01/22 0550  NA 142  --  142  --  143  --  143 144  K 3.8  --  4.0  --  3.8  --  4.1 3.5  CL 110  --  110  --  111  --  110 106  CO2 21*  --  22  --  24  --  23 26  GLUCOSE 121*  --  266*  --  169*  --  138* 164*  BUN 19  --  27*  --  33*  --  32* 42*  CREATININE 0.99  --  0.93  --  0.90  --  0.89 1.16*  CALCIUM 7.9*  --  8.1*  --  8.0*  --  7.8* 8.0*  MG 2.2   < > 2.3 2.4 2.4 2.4 2.4  --   PHOS  --    < > 2.8 2.9 2.9 3.5 3.6  --    < > = values in this interval not displayed.    CBC: Recent Labs  Lab 03/28/22 0500 03/29/22 0435 03/30/22 0600 03/31/22 0559 04/01/22 0550  WBC 11.1* 10.9* 11.0* 9.9 9.9  HGB 7.9* 8.1* 7.8* 8.0* 8.1*  HCT 24.4* 25.7* 25.4* 25.7* 25.5*  MCV 87.8 89.2 89.4 90.5 88.9  PLT 64* 69* 74* 79* 86*     Coagulation Studies: No results for input(s): "LABPROT", "INR" in the last 72 hours.  Imaging New brain imaging overnight  ASSESSMENT AND PLAN:  76 year old female with meningioma who presented with seizure.   Meningioma s/p resection Cerebral edema Epilepsy --Patient continues to have intermittent  episodes of gaze deviation with decreased responsiveness.  We have obtain video EEG in the past without any ictal activity during these episodes.  It is possible that these episodes are focal seizures.  Other differential includes stimulation of the frontal eye fields due to the recent surgery and subsequent edema  Recommendations -We will increase Keppra to 750 mg twice daily to see if it helps with these episodes -If episodes persist, can increase Keppra to 1000 mg twice daily -Continue Vimpat 200 mg twice daily  -If patient's mental status does not improve over the next couple of hours, would recommend CT head to look for any acute abnormality -Continue seizure precautions -Discussed plan with patient's sister and niece at bedside as well as critical care team - Management of rest of comorbidities per primary team   I have spent a total of  40 minutes with the patient reviewing hospital notes,  test results, labs and examining the patient as well as establishing an assessment and plan that was discussed personally with the patient's  sisters at bedside.  > 50% of time was spent in direct patient care.    Zeb Comfort Epilepsy [facility] For questions after 5pm please refer to AMION to reach the Neurologist on call

## 2022-04-01 NOTE — Procedures (Addendum)
Patient Name: Tara [name]  MRN: [id number]  Epilepsy Attending: Lora Havens  Referring Physician/Provider: Gleason, Otilio Carpen, PA-C Date: 04/01/2022 Duration: 22.24 mins   Patient history: 76 y.o. female with past medical history of anxiety, depression, GERD, hypertension, hyperlipidemia, nonischemic cardiomyopathy, atrial fibrillation on eliquis, Systolic HF, hx of breast cancer, meningioma who presented to the hospital on 03/15/2022 for evaluation of new onset seizures.   Level of alertness: Awake   AEDs during EEG study: LEV, LCM, Clonazepam   Technical aspects: This EEG study was done with scalp electrodes positioned according to the 10-20 International system of electrode placement. Electrical activity was acquired at a sampling rate of '500Hz'$  and reviewed with a high frequency filter of '70Hz'$  and a low frequency filter of '1Hz'$ . EEG data were recorded continuously and digitally stored.    Description: No clear posterior dominant rhythm was seen.  Sleep was characterized by sleep spindles (12 to 14 Hz), maximal frontocentral region. EEG showed continuous 3 to 5 Hz theta-delta slowing. Hyperventilation and photic stimulation were not performed.    Patient was noted to have left upward gaze on stimulation.  Concomitant EEG before, during and after the event did not show any EEG change to suggest seizure.   ABNORMALITY - Continuous slow   IMPRESSION: This study is suggestive moderate diffuse encephalopathy, nonspecific etiology.  No seizures or epileptiform discharges were seen throughout the recording.  Patient was noted to have left upward gaze on stimulation without concomitant EEG change.  These episodes were most likely not epileptic.  However, focal motor seizures may not be seen on scalp EEG.  Therefore clinical correlation is recommended.   Margarete Horace Barbra Sarks

## 2022-04-01 NOTE — Progress Notes (Signed)
Patient ID: Tara [name], female   DOB: Aug 01, 1946, 76 y.o.   MRN: [id number] BP (!) 148/72   Pulse 71   Temp 98.7 F (37.1 C) (Axillary)   Resp 12   Ht '4\' 9"'$  (1.448 m)   Wt 85 kg   SpO2 95%   BMI 40.55 kg/m  Lethargic, will tell me her name, follows some commands Another CT ordered, no change. The utility of repeating multiple CT's when the symptomatic presentation does not change is highly questionable. The continued radiation to the head is not indicated unless the symptomatic presentation were to change.  Moving lower extremities, wound without signs of infection.  Hope that dose modulation will help with possible seizure activity.

## 2022-04-01 NOTE — Progress Notes (Signed)
RN and CCM requested at bedside by pt's family d/t concern for seizure activity. Pt found with fixed upward left gaze and not answering questions. Pt had been minimally responsive and able to respond with delay to yes and no questions previously. Stat EEG ordered. EEG called by 4N RN and EEG obtained shortly thereafter. Dr. Lake Bells notified that EEG was obtained. Neuro exam remains unchanged.

## 2022-04-01 NOTE — Progress Notes (Addendum)
Nutrition Follow-up  DOCUMENTATION CODES:   Obesity unspecified  INTERVENTION:   Tube feeding via Cortrak tube: Glucerna 1.5 at 40 ml/h (960 ml per day) Prosource TF 45 ml BID  Provides 1520 kcal, 101 gm protein, 820 ml free water daily  MVI with minerals daily  NUTRITION DIAGNOSIS:   Inadequate oral intake related to inability to eat as evidenced by NPO status. Ongoing.   GOAL:   Patient will meet greater than or equal to 90% of their needs Progressing.   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   Pt discussed during ICU rounds and with RN and CCM MD.  Concern for sz, EEG completed, neruology following.   6/24 admit with new onset seizures 7/3 s/p bifrontal craniotomy for meningioma resection 7/6 less responsive, vomiting overnight with suspected aspiration 7/7 s/p cortrak placement; tip distal stomach/proximal duodenum  7/8 TF changed to Glucerna 1.5 per MD  Medications reviewed and include: decadron, SSI, semglee, keprra, protonix, miralax, senokot Keppra  Labs reviewed CBG's: 135-182   Diet Order:   Diet Order             Diet NPO time specified  Diet effective now                   EDUCATION NEEDS:   Not appropriate for education at this time  Skin:  Skin Assessment: Reviewed RN Assessment (head incision)  Last BM:  7/10 small  Height:   Ht Readings from Last 1 Encounters:  03/15/22 '4\' 9"'$  (1.448 m)    Weight:   Wt Readings from Last 1 Encounters:  03/15/22 85 kg    BMI:  Body mass index is 40.55 kg/m.  Estimated Nutritional Needs:   Kcal:  1400-1600  Protein:  70-85 grams  Fluid:  >1.5 L/day  Lockie Pares., RD, LDN, CNSC See AMiON for contact information

## 2022-04-01 NOTE — Progress Notes (Signed)
Patient ID: Tara [name], female   DOB: [birth year], 76 y.o.   MRN: [id number] Meningothelial menigioma grade 1, final pathology

## 2022-04-01 NOTE — Progress Notes (Signed)
NAME:  Tara [name], MRN:  [id number], DOB:  1946/05/08, LOS: 49 ADMISSION DATE:  03/15/2022, CONSULTATION DATE:  04/01/22 REFERRING MD:  Tara [name], CHIEF COMPLAINT:  Meningioma, Seizure   History of Present Illness:  Tara [name] is a 76 y.o. F with PMH significant for Anxiety, Depression, T2DM, HTN, HL, cardiomyopathy, Atrial Fibrillation on Eliquis, CKD IIIb, breast Ca s/p chemoradiation in 2008 who presented to the ED 6/24 with new onset seizures.   She had a known planum sphenoidal meningioma that was increased in size on CT with surrounding edema, so was transferred to [facility] for further evaluation.  LTM showed no further seizures.  She was evaluated by neurosurgery and underwent bifrontal craniotomy for meningioma resection on 7/3 and was transferred to intensive care post-op with PCCM consult while in ICU.    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   Significant Hospital Events: Including procedures, antibiotic start and stop dates in addition to other pertinent events   6/24 presented to [facility] with new onset seizures, CTH with enlarging meningioma with edema, loaded with Keppra 7/3 Underwent bifrontal craniotomy for meningioma, monitor in ICU 7/5 stable, no further seizure activity 7/6 less responsive, two episodes of vomiting overnight and increased oral secretions today.  Continuous blinking on exam. CTH  yesterday with new 7 mm thick subdural hematoma at the foramen magnum and tracking adjacent to the basion and odontoid not currently causing critical stenosis of the foramen magnum per radiology read, but merits surveillance. 7/7 Better night and more awake and responsive, repeat head CT unchanged and no seizures on video EEG 7/9 Recurrent seizure overnight requiring '4mg'$  of IV ativan to break. This am less responsive with forced right gaze  7/10 no further seizure activity, awake, responding, no forced gaze   Interim History / Subjective:  Stable today, slightly more responsive  Good UOP with one dose Lasix yesterday, 2.8L  Objective   Blood pressure 134/64, pulse 72, temperature (!) 96.4 F (35.8 C), temperature source Axillary, resp. rate 18, height '4\' 9"'$  (1.448 m), weight 85 kg, SpO2 94 %.        Intake/Output Summary (Last 24 hours) at 04/01/2022 0809 Last data filed at 04/01/2022 0600 Gross per 24 hour  Intake 1380.07 ml  Output 2750 ml  Net -1369.93 ml    Filed Weights   03/15/22 0130  Weight: 85 kg   General:  ill-appearing elderly F sleeping in bed in no acute distress HEENT: MM pink/moist, craniotomy incision dry and clean Neuro: opens eyes to voice, slow to answer but will state where she is, moving extremities to command CV: s1s2 rrr, no m/r/g PULM:  clear bilaterally on RA GI: soft, non-distended Extremities: warm/dry, 1+ pre-tibial edema  Skin: no rashes or lesions  Labs reviewed: Hgb 8.1 Creatinine 1.16 Glu 145  Resolved Hospital Problem list   AKI  Assessment & Plan:   Sphenoidal Meningioma, enlarging with new vasogenic edema and new onset seizures Post-op day #7 s/p bifrontal craniotomy, EEG negative  Has had several episodes of seizure-like activity post-op most likely 2/2 cerebrum disturbed by  resection Repeat CTH's have been without change -remains in ICU for close neuro monitoring  -Primary management per NSGY  -Maintain neuro protective  measures; goal for eurothermia, euglycemia, eunatremia, normoxia, and PCO2 goal of 35-40 -Nutrition and bowel regiment  -Seizure precautions with PRN benzodiazepines -Decadron taper in place, reduced to '4mg'$  qd 7/10  -AEDs per NSGY -Aspirations precautions  -worked with PT yesterday, will likely need SNF  Leukocytosis, Afebrile  WBC improved  -likely reactive vs aspiration pna 2/2 vomiting -completes 5 day course of Unasyn for aspiration today  Thrombocytopenia - slowly improving  ABLA Acute on chronic thrombocytopenia, platelets continue to improve No evidence of bleeding  -Continue to trend CBC  -Monitor for signs of bleeding  -transfuse per protocol  -Heparin remains on hold   HTN HFpEF, chronic Atrial Fibrillation Echo 6/9 with EF 55% and Grade I diastolic dysfunction -Lasix yesterday with good UOP -Continuous telemetry  -Strict intake and output  -Daily weight to assess volume status -Closely monitor renal function and electrolytes  -Anticoagulation remains on hold -Continue Amiodarone, Coreg and Crestor   Type 2 DM -Continue SSI -TF held yesterday after seizures, mental status improved today so will resume -CBG goal 140-180  GERD P: Continue PPI   Best Practice (right click and "Reselect all SmartList Selections" daily)   Diet/type: tubefeeds DVT prophylaxis: SCD GI prophylaxis: N/A Lines: Central line, keep for poor peripheral access per Dr. Christella [name] Foley:  N/A Code Status:  full code Last date of multidisciplinary goals of care discussion: Discussion per primary, husband updated at the bedside  Critical care: NA   Tara [name] Tara Hasley, PA-C [facility] See Amion for pager If no response to pager , please call [phone] until 7pm After 7:00 pm call Elink  [phone]

## 2022-04-01 NOTE — Progress Notes (Signed)
Interim progress note:  Called into room by family for tachypnea, pt had L gaze deviation and was not responding- similar to prior episodes this hospitalization.  Discussed with Dr. Lake Bells, will order stat EEG.     Otilio Carpen Janita Camberos, PA-C

## 2022-04-02 DIAGNOSIS — R569 Unspecified convulsions: Secondary | ICD-10-CM | POA: Diagnosis not present

## 2022-04-02 LAB — BASIC METABOLIC PANEL WITH GFR
Anion gap: 9 (ref 5–15)
BUN: 41 mg/dL — ABNORMAL HIGH (ref 8–23)
CO2: 23 mmol/L (ref 22–32)
Calcium: 8.1 mg/dL — ABNORMAL LOW (ref 8.9–10.3)
Chloride: 109 mmol/L (ref 98–111)
Creatinine, Ser: 0.97 mg/dL (ref 0.44–1.00)
GFR, Estimated: >60 mL/min
Glucose, Bld: 136 mg/dL — ABNORMAL HIGH (ref 70–99)
Potassium: 3.8 mmol/L (ref 3.5–5.1)
Sodium: 141 mmol/L (ref 135–145)

## 2022-04-02 LAB — GLUCOSE, CAPILLARY
Glucose-Capillary: 112 mg/dL — ABNORMAL HIGH (ref 70–99)
Glucose-Capillary: 114 mg/dL — ABNORMAL HIGH (ref 70–99)
Glucose-Capillary: 151 mg/dL — ABNORMAL HIGH (ref 70–99)
Glucose-Capillary: 152 mg/dL — ABNORMAL HIGH (ref 70–99)
Glucose-Capillary: 154 mg/dL — ABNORMAL HIGH (ref 70–99)
Glucose-Capillary: 172 mg/dL — ABNORMAL HIGH (ref 70–99)

## 2022-04-02 LAB — CBC
HCT: 25.2 % — ABNORMAL LOW (ref 36.0–46.0)
Hemoglobin: 8 g/dL — ABNORMAL LOW (ref 12.0–15.0)
MCH: 27.9 pg (ref 26.0–34.0)
MCHC: 31.7 g/dL (ref 30.0–36.0)
MCV: 87.8 fL (ref 80.0–100.0)
Platelets: 81 K/uL — ABNORMAL LOW (ref 150–400)
RBC: 2.87 MIL/uL — ABNORMAL LOW (ref 3.87–5.11)
RDW: 17.4 % — ABNORMAL HIGH (ref 11.5–15.5)
WBC: 10 K/uL (ref 4.0–10.5)
nRBC: 0.2 % (ref 0.0–0.2)

## 2022-04-02 MED ORDER — FUROSEMIDE 10 MG/ML PO SOLN
40.0000 mg | Freq: Once | ORAL | Status: AC
  Administered 2022-04-02: 40 mg
  Filled 2022-04-02: qty 4

## 2022-04-02 MED ORDER — POTASSIUM CHLORIDE 20 MEQ PO PACK
20.0000 meq | PACK | Freq: Once | ORAL | Status: AC
  Administered 2022-04-02: 20 meq
  Filled 2022-04-02: qty 1

## 2022-04-02 MED ORDER — LEVETIRACETAM 100 MG/ML PO SOLN
1000.0000 mg | Freq: Two times a day (BID) | ORAL | Status: DC
  Administered 2022-04-02 – 2022-05-05 (×67): 1000 mg
  Filled 2022-04-02 (×70): qty 10

## 2022-04-02 NOTE — Progress Notes (Signed)
Patient ID: FALISA LAMORA, female   DOB: [birth year], 76 y.o.   MRN: [id number] BP (!) 142/67   Pulse 69   Temp 97.9 F (36.6 C) (Axillary)   Resp (!) 23   Ht '4\' 9"'$  (1.448 m)   Wt 84.9 kg   SpO2 94%   BMI 40.50 kg/m  Lethargic, oriented to person, will follow commands Wound is clean and dry, no signs of infection Moving all extremities No changes noted on exam

## 2022-04-02 NOTE — Progress Notes (Signed)
NAME:  Tara [name], MRN:  [id number], DOB:  06/30/1946, LOS: 15 ADMISSION DATE:  03/15/2022, CONSULTATION DATE:  04/02/22 REFERRING MD:  Christella Noa, CHIEF COMPLAINT:  Meningioma, Seizure   History of Present Illness:  Tara [name] is a 76 y.o. F with PMH significant for Anxiety, Depression, T2DM, HTN, HL, cardiomyopathy, Atrial Fibrillation on Eliquis, CKD IIIb, breast Ca s/p chemoradiation in 2008 who presented to the ED 6/24 with new onset seizures.   She had a known planum sphenoidal meningioma that was increased in size on CT with surrounding edema, so was transferred to [facility] for further evaluation.  LTM showed no further seizures.  She was evaluated by neurosurgery and underwent bifrontal craniotomy for meningioma resection on 7/3 and was transferred to intensive care post-op with PCCM consult while in ICU.    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   Significant Hospital Events: Including procedures, antibiotic start and stop dates in addition to other pertinent events   6/24 presented to [facility] ED with new onset seizures, CTH with enlarging meningioma with edema, loaded with Keppra 7/3 Underwent bifrontal craniotomy for meningioma, monitor in ICU 7/5 stable, no further seizure activity 7/6 less responsive, two episodes of vomiting overnight and increased oral secretions today.  Continuous blinking on exam. CTH  yesterday with new 7 mm thick subdural hematoma at the foramen magnum and tracking adjacent to the basion and odontoid not currently causing critical stenosis of the foramen magnum per radiology read, but merits surveillance. 7/7 Better night and more awake and responsive, repeat head CT unchanged and no seizures on video EEG 7/9 Recurrent seizure overnight requiring '4mg'$  of IV ativan to break. This am less responsive with forced right gaze  7/10 no further seizure activity, awake, responding, no forced gaze 7/11 recurrent sz like activity. Not correlated on EEG. CT unchanged.    Interim History / Subjective:  No acute events overnight.   Objective   Blood pressure (!) 142/67, pulse 69, temperature 98.7 F (37.1 C), temperature source Axillary, resp. rate 18, height '4\' 9"'$  (1.448 m), weight 84.9 kg, SpO2 93 %.        Intake/Output Summary (Last 24 hours) at 04/02/2022 6948 Last data filed at 04/02/2022 0800 Gross per 24 hour  Intake 1181.17 ml  Output 1150 ml  Net 31.17 ml    Filed Weights   03/15/22 0130 04/02/22 0500  Weight: 85 kg 84.9 kg    General:  elderly appearing female in NAD HEENT: craniotomy incision clear and dry.  Neuro: Alert and oriented to self, place. Follows commands weakly in all four extremities.  CV: RRR, no MRG PULM:  Clear bilateral breath sounds GI: soft, NT, ND Extremities: No acute deformity Skin: grossly intact  Labs reviewed: Hgb 8.0 Plt 81 Creatinine 0.97 Glu 145  Resolved Hospital Problem list   AKI  Assessment & Plan:   Sphenoidal Meningioma, enlarging with new vasogenic edema and new onset seizures Post-op day #8 s/p bifrontal craniotomy, EEG negative  Has had several episodes of seizure-like activity post-op most likely 2/2 cerebrum disturbed by resection Repeat CTH's  have been without change Now 7/12 with rhythmic movements of L jaw -Primary management per NSGY  -Enteral nutrition and bowel regimen -Seizure precautions with PRN  benzodiazepines -Decadron taper in place, reduced to '4mg'$  qd 7/10  -Keppra, Vimpat. Appreciate neurology -ongoing PT  Leukocytosis, Afebrile  WBC improved  -likely reactive vs aspiration pna 2/2 vomiting -completed 5 day course of Unasyn for aspiration 7/11  Thrombocytopenia - slowly improving  ABLA Acute on chronic thrombocytopenia No evidence of active bleeding. Does have hematoma/ecchymosis r/t surgery -Continue to trend CBC  -Monitor for signs of bleeding  -transfuse per protocol  -AC remains on hold.   HTN HFpEF, chronic Atrial Fibrillation Echo 6/9 with EF 55% and Grade I diastolic dysfunction -Holding further diuresis due to creatinine bump.  -Strict intake and output  -Closely monitor renal function and electrolytes  -Anticoagulation remains on hold -Continue Amiodarone, Coreg and Crestor   Type 2 DM -Continue SSI -TF held yesterday after seizures, mental status improved today so will resume -CBG goal 140-180  GERD P: Continue PPI   Best Practice (right click and "Reselect all SmartList Selections" daily)   Diet/type: tubefeeds DVT prophylaxis: SCD GI prophylaxis: N/A Lines: Central line, keep for poor peripheral access per Dr. Christella Noa Foley:  N/A Code Status:  full code Last date of multidisciplinary goals of care discussion: Discussion per primary, husband updated at the bedside  Critical care: NA    [name], AGACNP-BC [place] for personal pager PCCM on call pager [phone] until 7pm. Please call Elink 7p-7a. [phone]  04/02/2022 8:34 AM

## 2022-04-02 NOTE — Progress Notes (Signed)
Orthopedic Tech Progress Note Patient Details:  Tara [name] Mar 04, 1946 [id number]  Ortho Devices Type of Ortho Device: Prafo boot/shoe Ortho Device/Splint Location: BLE Ortho Device/Splint Interventions: Ordered     RN placed boots on patient.  Vernona Rieger 04/02/2022, 6:58 PM

## 2022-04-02 NOTE — Progress Notes (Signed)
Subjective: Continues to have intermittent episodes of decreased awareness, left or right gaze preference.  Also had an episode of left corner of the mouth twitching this morning.  ROS: Unable to obtain due to poor mental status  Examination  Vital signs in last 24 hours: Temp:  [97.9 F (36.6 C)-99 F (37.2 C)] 97.9 F (36.6 C) (07/12 1200) Pulse Rate:  [63-76] 63 (07/12 1200) Resp:  [11-37] 13 (07/12 1200) BP: (69-150)/(50-80) 129/80 (07/12 1200) SpO2:  [91 %-95 %] 92 % (07/12 1200) Weight:  [84.9 kg] 84.9 kg (07/12 0500)  General: lying in bed, not in apparent distress  Neuro: Awake, sitting on recliner, does not follow commands, PERLA, RIGHT gaze deviation, unable to overcome with oculocephalics, does not withdraw to noxious stimuli in all 4 extremities  Basic Metabolic Panel: Recent Labs  Lab 03/29/22 0435 03/29/22 1643 03/30/22 0600 03/30/22 1727 03/31/22 0559 04/01/22 0550 04/02/22 0428  NA 142  --  143  --  143 144 141  K 4.0  --  3.8  --  4.1 3.5 3.8  CL 110  --  111  --  110 106 109  CO2 22  --  24  --  '23 26 23  '$ GLUCOSE 266*  --  169*  --  138* 164* 136*  BUN 27*  --  33*  --  32* 42* 41*  CREATININE 0.93  --  0.90  --  0.89 1.16* 0.97  CALCIUM 8.1*  --  8.0*  --  7.8* 8.0* 8.1*  MG 2.3 2.4 2.4 2.4 2.4  --   --   PHOS 2.8 2.9 2.9 3.5 3.6  --   --     CBC: Recent Labs  Lab 03/29/22 0435 03/30/22 0600 03/31/22 0559 04/01/22 0550 04/02/22 0428  WBC 10.9* 11.0* 9.9 9.9 10.0  HGB 8.1* 7.8* 8.0* 8.1* 8.0*  HCT 25.7* 25.4* 25.7* 25.5* 25.2*  MCV 89.2 89.4 90.5 88.9 87.8  PLT 69* 74* 79* 86* 81*     Coagulation Studies: No results for input(s): "LABPROT", "INR" in the last 72 hours.  Imaging CT head on 04/01/2022 was stable  ASSESSMENT AND PLAN: 76 year old female with meningioma who presented with seizure.   Meningioma s/p resection Cerebral edema Epilepsy --Patient continues to have intermittent episodes of gaze deviation with decreased  responsiveness.  We have obtain video EEG in the past without any ictal activity during these episodes.  It is possible that these episodes are focal seizures.  Other differential includes stimulation of the frontal eye fields due to the recent surgery and subsequent edema   Recommendations -We will increase Keppra to 1000 mg twice daily as patient had rhythmic left foralumab twitching this morning -If episodes persist, can obtain another LTM EEG.  However, I will be hesitant to continue to increase AEDs without improvement in mental status and no definite EEG changes -Continue Vimpat 200 mg twice daily  -Critical care team discussed with neurosurgery Dr. Christella Noa.  Patient is expected to have such presentation for up to a couple of months after surgery and therefore neurosurgery team would prefer a wait-and-see approach.  Recommended avoiding further CT scans to minimize risk of radiation.  Will defer to neurosurgery -Continue seizure precautions - Management of rest of comorbidities per primary team   I have spent a total of  38 minutes with the patient reviewing hospital notes,  test results, labs and examining the patient as well as establishing an assessment and plan.  > 50% of time was  spent in direct patient care.   Zeb Comfort Epilepsy [facility] For questions after 5pm please refer to AMION to reach the Neurologist on call

## 2022-04-02 NOTE — Progress Notes (Signed)
Physical Therapy Treatment Patient Details Name: Tara [name] MRN: [id number] DOB: December 20, 1945 Today's Date: 04/02/2022   History of Present Illness 76 yo woman admitted 03/15/22 with new onset seizures related to meningioma. s/p tumor resection 7/3. Recent DC from [facility] 6/22. PMH: CKD stage 3, HFpEF, a fib,  meningioma.    PT Comments    Pt admitted with above diagnosis. Pt was able to sit EOB with min guard assist for up to 10 min prior to fatigue. Pt following commands today as well. Pt was more alert as well. Continue and progress as able.  Pt currently with functional limitations due to balance and endurance deficits. Pt will benefit from skilled PT to increase their independence and safety with mobility to allow discharge to the venue listed below.      Recommendations for follow up therapy are one component of a multi-disciplinary discharge planning process, led by the attending physician.  Recommendations may be updated based on patient status, additional functional criteria and insurance authorization.  Follow Up Recommendations  Skilled nursing-short term rehab (<3 hours/day) Can patient physically be transported by private vehicle: No   Assistance Recommended at Discharge Frequent or constant Supervision/Assistance  Patient can return home with the following Two people to help with walking and/or transfers;Two people to help with bathing/dressing/bathroom;Assistance with cooking/housework;Direct supervision/assist for medications management;Help with stairs or ramp for entrance;Direct supervision/assist for financial management;Assist for transportation;Assistance with feeding   Equipment Recommendations  Wheelchair (measurements PT);Wheelchair cushion (measurements PT);Hospital bed;Other (comment) (lift equipment; pending progress)    Recommendations for Other Services       Precautions / Restrictions Precautions Precautions: Fall Precaution Comments: LUE edema;  seizures Restrictions Weight Bearing Restrictions: No     Mobility  Bed Mobility Overal bed mobility: Needs Assistance Bed Mobility: Supine to Sit, Sit to Supine Rolling: Max assist   Supine to sit: Max assist, +2 for physical assistance, HOB elevated     General bed mobility comments: Needed assist to come to eOB but pt initiated movement. needed incr assist for trunk    Transfers Overall transfer level: Needs assistance Equipment used: Rolling walker (2 wheels), 2 person hand held assist Transfers: Sit to/from Stand, Bed to chair/wheelchair/BSC Sit to Stand: Total assist, +2 physical assistance, From elevated surface          Lateral/Scoot Transfers: Total assist, Max assist, +2 physical assistance, From elevated surface General transfer comment: Pt attempts at stand difficult as pt is 4 feet 9 inches.  Scooted pt with use of pad to drop arm recliner.    Ambulation/Gait                   Stairs             Wheelchair Mobility    Modified Rankin (Stroke Patients Only) Modified Rankin (Stroke Patients Only) Pre-Morbid Rankin Score: Moderately severe disability Modified Rankin: Severe disability     Balance Overall balance assessment: Needs assistance Sitting-balance support: Single extremity supported, Bilateral upper extremity supported, Feet supported Sitting balance-Leahy Scale: Poor Sitting balance - Comments: Pt able to sit statically EOB with min guard assist intially for about 10 min.  As pt fatigued she began leaning to her right and needed min to mod assist to balance until PT assisted her to chair. Pt followed commands while sitting to exercise bil UE and LEs Postural control: Posterior lean, Right lateral lean Standing balance support: Bilateral upper extremity supported, During functional activity Standing balance-Leahy Scale: Poor Standing balance comment: unable  Cognition Arousal/Alertness:  Awake/alert Behavior During Therapy: Flat affect Overall Cognitive Status: Impaired/Different from baseline Area of Impairment: Following commands, Attention, Problem solving                   Current Attention Level: Focused   Following Commands: Follows one step commands with increased time, Follows one step commands inconsistently Safety/Judgement: Decreased awareness of deficits, Decreased awareness of safety   Problem Solving: Slow processing, Decreased initiation, Difficulty sequencing, Requires verbal cues, Requires tactile cues General Comments: following 1 step commands consistently;  eyes opened throughout        Exercises General Exercises - Upper Extremity Shoulder Flexion: 5 reps, Both, AAROM, Seated Elbow Flexion: Both, 5 reps, AAROM, Seated Elbow Extension: Both, 5 reps, AAROM, Seated General Exercises - Lower Extremity Ankle Circles/Pumps: AROM, Both, 10 reps, Seated Long Arc Quad: AROM, Both, Seated, 10 reps    General Comments General comments (skin integrity, edema, etc.): VSS      Pertinent Vitals/Pain Pain Assessment Pain Assessment: Faces Faces Pain Scale: Hurts little more Pain Location: generalized Pain Descriptors / Indicators: Grimacing, Moaning Pain Intervention(s): Limited activity within patient's tolerance, Monitored during session, Repositioned    Home Living                          Prior Function            PT Goals (current goals can now be found in the care plan section) Progress towards PT goals: Progressing toward goals    Frequency    Min 3X/week      PT Plan Current plan remains appropriate    Co-evaluation              AM-PAC PT "6 Clicks" Mobility   Outcome Measure  Help needed turning from your back to your side while in a flat bed without using bedrails?: Total Help needed moving from lying on your back to sitting on the side of a flat bed without using bedrails?: Total Help needed moving  to and from a bed to a chair (including a wheelchair)?: Total Help needed standing up from a chair using your arms (e.g., wheelchair or bedside chair)?: Total Help needed to walk in hospital room?: Total Help needed climbing 3-5 steps with a railing? : Total 6 Click Score: 6    End of Session Equipment Utilized During Treatment: Gait belt Activity Tolerance: Patient limited by fatigue Patient left: with call bell/phone within reach;in chair;with chair alarm set Nurse Communication: Mobility status;Need for lift equipment (MAximove back to bed with pad in place) PT Visit Diagnosis: Unsteadiness on feet (R26.81);Muscle weakness (generalized) (M62.81);Other symptoms and signs involving the nervous system (R29.898);Other abnormalities of gait and mobility (R26.89);Difficulty in walking, not elsewhere classified (R26.2)     Time: 1033-1100 PT Time Calculation (min) (ACUTE ONLY): 27 min  Charges:  $Therapeutic Exercise: 8-22 mins $Therapeutic Activity: 8-22 mins                     [facility] M,PT Acute Rehab Services [phone]    Alvira Philips 04/02/2022, 2:04 PM

## 2022-04-02 NOTE — Progress Notes (Signed)
Speech Language Pathology Treatment: Dysphagia;Cognitive-Linquistic  Patient Details Name: Tara [name] MRN: [id number] DOB: 1946-09-02 Today's Date: 04/02/2022 Time: 5277-8242 SLP Time Calculation (min) (ACUTE ONLY): 15 min  Assessment / Plan / Recommendation Clinical Impression  Pt seen up in chair after PT session. Pt kept eyes closed and was smacking lips almost constantly, but was able to respond to simple social language and wants/needs questions. By the end of session lip smacking was so intense pt was prevented from talking. Could not stop on command. However, pt was consistently able to orally manipulate ice and sips of water; no signs of aspiration with swallowing. Pt did start smacking lips during oral manipulation of puree and seemed to have delayed swallow in that instance. Recommend RN start offering sips of water today. SLP will f/u for further advancement.   HPI HPI: Pt is a 76 y/o who presented 6/24 with new onset seizures. 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 EEG negative for seizures. Neurosurgey consulted and did not recommend meningioma resection as of 6/27. Decline in swallow function noted overnight 6/27. Dx acute toxic metabolic encephalopathy suspected secondary to keppra. Pt s/p Bifrontal craniotomy for resection of meningioma 7/3. PMH: HTN, HLD, GERD, CKD stage IIIb, chronic HFpEF, meningioma, breast CA-s/p right lumpectomy and chemoradiation in 2008, depression/anxiety. BSE 03/11/22: regular texture diet and thin liquids without need for follow up.      SLP Plan  Continue with current plan of care      Recommendations for follow up therapy are one component of a multi-disciplinary discharge planning process, led by the attending physician.  Recommendations may be updated based on patient status, additional functional criteria and insurance authorization.    Recommendations  Diet recommendations: Thin liquid Liquids  provided via: Straw;Cup Medication Administration: Crushed with puree Supervision: Staff to assist with self feeding;Full supervision/cueing for compensatory strategies Compensations: Minimize environmental distractions;Slow rate;Small sips/bites;Lingual sweep for clearance of pocketing Postural Changes and/or Swallow Maneuvers: Seated upright 90 degrees                Oral Care Recommendations: Oral care BID Follow Up Recommendations: Acute inpatient rehab (3hours/day) Plan: Continue with current plan of care           Kasaundra Fahrney, [name]  04/02/2022, 1:17 PM

## 2022-04-03 DIAGNOSIS — R569 Unspecified convulsions: Secondary | ICD-10-CM | POA: Diagnosis not present

## 2022-04-03 LAB — BASIC METABOLIC PANEL
Anion gap: 13 (ref 5–15)
BUN: 38 mg/dL — ABNORMAL HIGH (ref 8–23)
CO2: 25 mmol/L (ref 22–32)
Calcium: 8.8 mg/dL — ABNORMAL LOW (ref 8.9–10.3)
Chloride: 103 mmol/L (ref 98–111)
Creatinine, Ser: 1.07 mg/dL — ABNORMAL HIGH (ref 0.44–1.00)
GFR, Estimated: 54 mL/min — ABNORMAL LOW (ref >60)
Glucose, Bld: 156 mg/dL — ABNORMAL HIGH (ref 70–99)
Potassium: 4.2 mmol/L (ref 3.5–5.1)
Sodium: 141 mmol/L (ref 135–145)

## 2022-04-03 LAB — CBC
HCT: 28.8 % — ABNORMAL LOW (ref 36.0–46.0)
Hemoglobin: 9 g/dL — ABNORMAL LOW (ref 12.0–15.0)
MCH: 28 pg (ref 26.0–34.0)
MCHC: 31.3 g/dL (ref 30.0–36.0)
MCV: 89.7 fL (ref 80.0–100.0)
Platelets: 99 10*3/uL — ABNORMAL LOW (ref 150–400)
RBC: 3.21 MIL/uL — ABNORMAL LOW (ref 3.87–5.11)
RDW: 17.3 % — ABNORMAL HIGH (ref 11.5–15.5)
WBC: 16.4 10*3/uL — ABNORMAL HIGH (ref 4.0–10.5)
nRBC: 0 % (ref 0.0–0.2)

## 2022-04-03 LAB — GLUCOSE, CAPILLARY
Glucose-Capillary: 116 mg/dL — ABNORMAL HIGH (ref 70–99)
Glucose-Capillary: 133 mg/dL — ABNORMAL HIGH (ref 70–99)
Glucose-Capillary: 147 mg/dL — ABNORMAL HIGH (ref 70–99)
Glucose-Capillary: 223 mg/dL — ABNORMAL HIGH (ref 70–99)
Glucose-Capillary: 252 mg/dL — ABNORMAL HIGH (ref 70–99)
Glucose-Capillary: 133 mg/dL — ABNORMAL HIGH (ref 70–99)

## 2022-04-03 MED ORDER — CARVEDILOL 6.25 MG PO TABS
6.2500 mg | ORAL_TABLET | Freq: Two times a day (BID) | ORAL | Status: DC
  Administered 2022-04-03 – 2022-04-10 (×11): 6.25 mg
  Filled 2022-04-03 (×2): qty 2
  Filled 2022-04-03: qty 1
  Filled 2022-04-03: qty 2
  Filled 2022-04-03 (×3): qty 1
  Filled 2022-04-03 (×5): qty 2
  Filled 2022-04-03: qty 1

## 2022-04-03 MED ORDER — APIXABAN 2.5 MG PO TABS
2.5000 mg | ORAL_TABLET | Freq: Two times a day (BID) | ORAL | Status: DC
  Administered 2022-04-03 – 2022-04-05 (×6): 2.5 mg via ORAL
  Filled 2022-04-03 (×7): qty 1

## 2022-04-03 NOTE — Progress Notes (Addendum)
Attending:    Subjective: More awake and alert today Still has tardive dyskinesia like mouth movements  Objective: Vitals:   04/03/22 0520 04/03/22 0600 04/03/22 0700 04/03/22 0800  BP: (!) 142/83 (!) 152/75 (!) 159/72   Pulse:  70 71   Resp:  16 16   Temp:    98.2 F (36.8 C)  TempSrc:    Axillary  SpO2:  93% 94%   Weight:      Height:          Intake/Output Summary (Last 24 hours) at 04/03/2022 1036 Last data filed at 04/03/2022 0745 Gross per 24 hour  Intake 990 ml  Output 1300 ml  Net -310 ml    General:  Resting comfortably in bed HENT: scalp incision without redness, drainage, OP clear PULM: CTA B, normal effort CV: RRR, no mgr GI: BS+, soft, nontender MSK: normal bulk and tone Neuro: awake, answers all of my questions with 1-2 word statements appropriately    CBC    Component Value Date/Time   WBC 16.4 (H) 04/03/2022 0635   RBC 3.21 (L) 04/03/2022 0635   HGB 9.0 (L) 04/03/2022 0635   HGB 11.1 05/28/2021 1200   HGB 12.4 10/12/2014 1000   HCT 28.8 (L) 04/03/2022 0635   HCT 34.9 05/28/2021 1200   HCT 39.3 10/12/2014 1000   PLT 99 (L) 04/03/2022 0635   PLT 288 05/28/2021 1200   MCV 89.7 04/03/2022 0635   MCV 85 05/28/2021 1200   MCV 82.2 10/12/2014 1000   MCH 28.0 04/03/2022 0635   MCHC 31.3 04/03/2022 0635   RDW 17.3 (H) 04/03/2022 0635   RDW 15.2 05/28/2021 1200   RDW 15.5 (H) 10/12/2014 1000   LYMPHSABS 0.4 (L) 03/20/2022 0451   LYMPHSABS 2.2 10/12/2014 1000   MONOABS 0.5 03/20/2022 0451   MONOABS 0.4 10/12/2014 1000   EOSABS 0.0 03/20/2022 0451   EOSABS 0.8 (H) 10/12/2014 1000   BASOSABS 0.0 03/20/2022 0451   BASOSABS 0.0 10/12/2014 1000    BMET    Component Value Date/Time   NA 141 04/03/2022 0635   NA 142 01/02/2022 1029   NA 143 10/12/2014 1001   K 4.2 04/03/2022 0635   K 3.9 10/12/2014 1001   CL 103 04/03/2022 0635   CO2 25 04/03/2022 0635   CO2 25 10/12/2014 1001   GLUCOSE 156 (H) 04/03/2022 0635   GLUCOSE 105 10/12/2014  1001   BUN 38 (H) 04/03/2022 0635   BUN 30 (H) 01/02/2022 1029   BUN 13.9 10/12/2014 1001   CREATININE 1.07 (H) 04/03/2022 0635   CREATININE 1.47 (H) 03/22/2020 1358   CREATININE 1.0 10/12/2014 1001   CALCIUM 8.8 (L) 04/03/2022 0635   CALCIUM 8.6 10/12/2014 1001   GFRNONAA 54 (L) 04/03/2022 0635   GFRNONAA 35 (L) 03/22/2020 1358   GFRAA 43 (L) 08/07/2020 1013   GFRAA 40 (L) 03/22/2020 1358      Impression: Meningioma s/p resection Seizure like activity Aspiration risk Anasarca Leukocystosis Atrial fibrillation Hypertension HFpEF  Discussion Mental status much improved today  Plan Amiodarone, crestor, increase coreg  Ice chips, SLP following Continue tube nutrition Decadron daily, consider stopping soon Keppra, vimpat to continue At this point given that the convulsive movements don't seem to be interfering with her progress, agree that there is no role for changing anti-convulsant therapy  Rest per NP note  My cc time n/a  Roselie Awkward, MD [place] [facility] Pager: [phone] Cell: [phone] After 7pm: [phone]

## 2022-04-03 NOTE — Progress Notes (Signed)
Speech Language Pathology Treatment: Dysphagia  Patient Details Name: Tara [name] MRN: [id number] DOB: 10-02-1945 Today's Date: 04/03/2022 Time: 5638-9373 SLP Time Calculation (min) (ACUTE ONLY): 14 min  Assessment / Plan / Recommendation Clinical Impression  Pt even more alert and responsive today but when asked reports she has no interest in eating or drinking. Pt able to participate in reasoning for PO trials and accepted all trials offered, though few. Lip smacking still constant at rest, but stops with sips of liquids or while chewing solids. She still smacks while manipulating puree. Pt with no signs of aspiration with thin liquids or puree. DID cough once after swallowing cookie. Pt shows tolerance of soft textures. Will initiate a dys 2 diet with thin liquids with the hope that by offering foods interest may improve. Will f/u for tolerance.   HPI HPI: Pt is a 76 y/o who presented 6/24 with new onset seizures. 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 EEG negative for seizures. Neurosurgey consulted and did not recommend meningioma resection as of 6/27. Decline in swallow function noted overnight 6/27. Dx acute toxic metabolic encephalopathy suspected secondary to keppra. Pt s/p Bifrontal craniotomy for resection of meningioma 7/3. PMH: HTN, HLD, GERD, CKD stage IIIb, chronic HFpEF, meningioma, breast CA-s/p right lumpectomy and chemoradiation in 2008, depression/anxiety. BSE 03/11/22: regular texture diet and thin liquids without need for follow up.      SLP Plan  Continue with current plan of care      Recommendations for follow up therapy are one component of a multi-disciplinary discharge planning process, led by the attending physician.  Recommendations may be updated based on patient status, additional functional criteria and insurance authorization.    Recommendations  Diet recommendations: Thin liquid;Dysphagia 2 (fine  chop) Liquids provided via: Cup;Straw Medication Administration: Crushed with puree Supervision: Staff to assist with self feeding;Full supervision/cueing for compensatory strategies Compensations: Minimize environmental distractions;Slow rate;Small sips/bites;Lingual sweep for clearance of pocketing Postural Changes and/or Swallow Maneuvers: Seated upright 90 degrees                Oral Care Recommendations: Oral care BID Follow Up Recommendations: Acute inpatient rehab (3hours/day) Plan: Continue with current plan of care           Detron Carras, [name]  04/03/2022, 12:28 PM

## 2022-04-03 NOTE — Progress Notes (Signed)
Subjective: No clinical seizure-like episodes overnight.  Sitting up in bed, repeated chewing and lipsmacking movements  ROS: Unable to obtain due to poor mental status  Examination  Vital signs in last 24 hours: Temp:  [97 F (36.1 C)-98.2 F (36.8 C)] 98.2 F (36.8 C) (07/13 0800) Pulse Rate:  [66-84] 77 (07/13 1200) Resp:  [13-24] 21 (07/13 1200) BP: (128-173)/(63-105) 152/83 (07/13 1200) SpO2:  [92 %-95 %] 95 % (07/13 1200)  General: Sitting in bed, not in apparent distress  Neuro: Awake, able to tell me her name and that she is in the hospital, did not follow commands, did not name objects, PERLA, does not track examiner neuro, spontaneously moving upper extremities in bed  Basic Metabolic Panel: Recent Labs  Lab 03/29/22 0435 03/29/22 1643 03/30/22 0600 03/30/22 1727 03/31/22 0559 04/01/22 0550 04/02/22 0428 04/03/22 0635  NA 142  --  143  --  143 144 141 141  K 4.0  --  3.8  --  4.1 3.5 3.8 4.2  CL 110  --  111  --  110 106 109 103  CO2 22  --  24  --  '23 26 23 25  '$ GLUCOSE 266*  --  169*  --  138* 164* 136* 156*  BUN 27*  --  33*  --  32* 42* 41* 38*  CREATININE 0.93  --  0.90  --  0.89 1.16* 0.97 1.07*  CALCIUM 8.1*  --  8.0*  --  7.8* 8.0* 8.1* 8.8*  MG 2.3 2.4 2.4 2.4 2.4  --   --   --   PHOS 2.8 2.9 2.9 3.5 3.6  --   --   --     CBC: Recent Labs  Lab 03/30/22 0600 03/31/22 0559 04/01/22 0550 04/02/22 0428 04/03/22 0635  WBC 11.0* 9.9 9.9 10.0 16.4*  HGB 7.8* 8.0* 8.1* 8.0* 9.0*  HCT 25.4* 25.7* 25.5* 25.2* 28.8*  MCV 89.4 90.5 88.9 87.8 89.7  PLT 74* 79* 86* 81* 99*     Coagulation Studies: No results for input(s): "LABPROT", "INR" in the last 72 hours.  Imaging No new brain imaging overnight  ASSESSMENT AND PLAN: 76 year old female with meningioma who presented with seizure, status postresection.   Meningioma s/p resection Cerebral edema Epilepsy Tardive dyskinesias --Patient continues to have intermittent episodes of gaze deviation  with decreased responsiveness.  We have obtain video EEG in the past without any ictal activity during these episodes.  It is possible that these episodes are focal seizures.  Other differential includes stimulation of the frontal eye fields due to the recent surgery and subsequent edema -Near continuous chewing and lip smacking could be tardive dyskinesias   Recommendations -Continue Keppra 1000 mg twice and Vimpat 200 mg twice daily -If episodes persist, can obtain another LTM EEG.  However, I will be hesitant to continue to increase AEDs without improvement in mental status and no definite EEG changes -Continue seizure precautions - Management of rest of comorbidities per primary team -Neurology will follow peripherally.  Please call for any further questions   I have spent a total of 25 minutes with the patient reviewing hospital notes,  test results, labs and examining the patient as well as establishing an assessment and plan.  > 50% of time was spent in direct patient care.   Zeb Comfort Epilepsy [facility] For questions after 5pm please refer to AMION to reach the Neurologist on call

## 2022-04-03 NOTE — Progress Notes (Signed)
Patient ID: Tara [name], female   DOB: 30-Aug-1946, 76 y.o.   MRN: [id number] BP (!) 172/82   Pulse 76   Temp 98.3 F (36.8 C) (Oral)   Resp 20   Ht '4\' 9"'$  (1.448 m)   Wt 84.9 kg   SpO2 93%   BMI 40.50 kg/m  Much more alert this evening. Responding quickly following commands Wound is clean, dry, no signs of infection If like this tomorrow plan on transfer to progressive

## 2022-04-03 NOTE — Progress Notes (Signed)
NAME:  Tara [name], MRN:  [id number], DOB:  May 20, 1946, LOS: 47 ADMISSION DATE:  03/15/2022, CONSULTATION DATE:  04/03/22 REFERRING MD:  Christella Noa, CHIEF COMPLAINT:  Meningioma, Seizure   History of Present Illness:  Tara [name] is a 76 y.o. F with PMH significant for Anxiety, Depression, T2DM, HTN, HL, cardiomyopathy, Atrial Fibrillation on Eliquis, CKD IIIb, breast Ca s/p chemoradiation in 2008 who presented to the ED 6/24 with new onset seizures.   She had a known planum sphenoidal meningioma that was increased in size on CT with surrounding edema, so was transferred to [facility] for further evaluation.  LTM showed no further seizures.  She was evaluated by neurosurgery and underwent bifrontal craniotomy for meningioma resection on 7/3 and was transferred to intensive care post-op with PCCM consult while in ICU.    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   Significant Hospital Events: Including procedures, antibiotic start and stop dates in addition to other pertinent events   6/24 presented to [facility] ED with new onset seizures, CTH with enlarging meningioma with edema, loaded with Keppra 7/3 Underwent bifrontal craniotomy for meningioma, monitor in ICU 7/5 stable, no further seizure activity 7/6 less responsive, two episodes of vomiting overnight and increased oral secretions today.  Continuous blinking on exam. CTH  yesterday with new 7 mm thick subdural hematoma at the foramen magnum and tracking adjacent to the basion and odontoid not currently causing critical stenosis of the foramen magnum per radiology read, but merits surveillance. 7/7 Better night and more awake and responsive, repeat head CT unchanged and no seizures on video EEG 7/9 Recurrent seizure overnight requiring '4mg'$  of IV ativan to break. This am less responsive with forced right gaze  7/10 no further seizure activity, awake, responding, no forced gaze 7/11 recurrent sz like activity. Not correlated on EEG. CT unchanged.    Interim History / Subjective:  No acute events overnight  Objective   Blood pressure (!) 159/72, pulse 71, temperature 98 F (36.7 C), temperature source Axillary, resp. rate 16, height '4\' 9"'$  (1.448 m), weight 84.9 kg, SpO2 94 %.        Intake/Output Summary (Last 24 hours) at 04/03/2022 0819 Last data filed at 04/03/2022 0745 Gross per 24 hour  Intake 1035 ml  Output 1300 ml  Net -265 ml    Filed Weights   03/15/22 0130 04/02/22 0500  Weight: 85 kg 84.9 kg    General:  elderly female resting comfortably in bed  HEENT: craniotomy incision clean and dry  Neuro: Alert and oriented to self and place. Follows commands in all four extremities with 4/5 strength. Lip smacking continuously.  CV: RRR, no MRG PULM:  Clear bilateral breath sounds.  GI: Soft, NT, ND Extremities: No acute deformity Skin: grossly intact.   Labs reviewed:  Hgb 8.0 Plt 81 Creatinine 0.97 Glu 145  Resolved Hospital Problem list   AKI  Assessment & Plan:   Sphenoidal Meningioma, enlarging with new vasogenic edema and new onset seizures: Post-op day 7/3 s/p bifrontal craniotomy, EEG negative  Has had several episodes of seizure-like activity  post-op most likely 2/2 cerebrum disturbed by resection Ongoing absence seizure type activity as well as lip smacking and facial twitching. None of which has been proven sz on EEG.  Repeat  CTH's have been without change -Primary management per NSGY  -Appreciate neurology/epileptology -Seizure precautions with PRN benzodiazepines -Decadron taper in place, reduced to '4mg'$  qd 7/10, will plan to DC tomorrow.  -Keppra, Vimpat. Appreciate neurology -ongoing PT  Leukocytosis, Afebrile  WBC up to 16 today 7/13. She is on steroids, but this is an acute change. Will monitor closely.  -likely reactive vs aspiration pna 2/2 vomiting -completed 5 day course of Unasyn for aspiration 7/11  Thrombocytopenia - slowly improving  ABLA Acute on chronic thrombocytopenia No evidence of active bleeding. Does have hematoma/ecchymosis r/t surgery -Continue to trend CBC   HTN HFpEF, chronic Atrial Fibrillation Echo 6/9 with EF 55% and Grade I diastolic dysfunction -Strict intake and output  -Closely monitor renal function and electrolytes  -OK to restart AC per NSGY -Continue Amiodarone, Coreg and Crestor. Increase coreg.  Type 2 DM -Continue SSI -CBG goal 140-180  GERD P: Continue PPI   Nutrition - appreciate SLP. Sips of water for now offered by RN orally. SLP following. Hoping to advance in the coming days.  - Continue tube feeds.  Best Practice (right click and "Reselect all SmartList Selections" daily)   Diet/type: tubefeeds DVT prophylaxis: SCD GI prophylaxis: N/A Lines: Central line, keep for poor peripheral access per Dr. Christella Noa Foley:  N/A Code Status:  full code Last date of multidisciplinary goals of care discussion: Discussion per primary, husband updated at the bedside  Critical care: NA    [name], AGACNP-BC [place] for personal pager PCCM on call pager [phone] until 7pm. Please call Elink 7p-7a. [phone]  04/03/2022 8:19 AM

## 2022-04-03 NOTE — TOC Progression Note (Signed)
Transition of Care [facility]) - Progression Note    Patient Details  Name: Tara [name] MRN: [id number] Date of Birth: [birth year]  [facility]) CM/SW [place], LCSW Phone Number: 04/03/2022, 11:40 AM  Clinical Narrative:    CSW will continue to follow for SNF needs.    Expected Discharge Plan: [place] Barriers to Discharge: Continued Medical Work up, Ship broker  Expected Discharge Plan and Services Expected Discharge Plan: [place]   Discharge Planning Services: CM Consult   Living arrangements for the past 2 months: [place]: [place]         Social Determinants of Health (SDOH) Interventions    Readmission Risk Interventions    03/06/2022   12:36 PM  Readmission Risk Prevention Plan  Transportation Screening Complete  HRI or Home Care Consult Complete  Social Work Consult for [place] Planning/Counseling Complete

## 2022-04-03 NOTE — TOC Progression Note (Signed)
Transition of Care [facility]) - Progression Note    Patient Details  Name: Tara [name] MRN: [id number] Date of Birth: Mar 12, 1946  Transition of Care [facility]) CM/SW [place], LCSW Phone Number: 04/03/2022, 11:39 AM  Clinical Narrative:    Patient transferred to 4N ICU. CSW will continue to follow for SNF needs.    Expected Discharge Plan: [place] Barriers to Discharge: Continued Medical Work up, Ship broker  Expected Discharge Plan and Services Expected Discharge Plan: [place]   Discharge Planning Services: CM Consult   Living arrangements for the past 2 months: [place]: [place]         Social Determinants of Health (SDOH) Interventions    Readmission Risk Interventions    03/06/2022   12:36 PM  Readmission Risk Prevention Plan  Transportation Screening Complete  HRI or Home Care Consult Complete  Social Work Consult for [place] Planning/Counseling Complete

## 2022-04-03 NOTE — TOC Progression Note (Signed)
Transition of Care [facility]) - Progression Note    Patient Details  Name: Tara [name] MRN: [id number] Date of Birth: August 15, 1946  Transition of Care [facility]) CM/SW [place], LCSW Phone Number: 04/03/2022, 11:40 AM  Clinical Narrative:    CSW continuing to follow for SNF needs.    Expected Discharge Plan: [place] Barriers to Discharge: Continued Medical Work up, Ship broker  Expected Discharge Plan and Services Expected Discharge Plan: [place]   Discharge Planning Services: CM Consult   Living arrangements for the past 2 months: [place]: [place]         Social Determinants of Health (SDOH) Interventions    Readmission Risk Interventions    03/06/2022   12:36 PM  Readmission Risk Prevention Plan  Transportation Screening Complete  HRI or Home Care Consult Complete  Social Work Consult for [place] Planning/Counseling Complete

## 2022-04-04 ENCOUNTER — Inpatient Hospital Stay (HOSPITAL_COMMUNITY): Payer: Medicare Other

## 2022-04-04 DIAGNOSIS — E7800 Pure hypercholesterolemia, unspecified: Secondary | ICD-10-CM | POA: Diagnosis not present

## 2022-04-04 DIAGNOSIS — I482 Chronic atrial fibrillation, unspecified: Secondary | ICD-10-CM | POA: Diagnosis not present

## 2022-04-04 DIAGNOSIS — D329 Benign neoplasm of meninges, unspecified: Secondary | ICD-10-CM | POA: Diagnosis not present

## 2022-04-04 DIAGNOSIS — R569 Unspecified convulsions: Secondary | ICD-10-CM | POA: Diagnosis not present

## 2022-04-04 LAB — BASIC METABOLIC PANEL
Anion gap: 15 (ref 5–15)
BUN: 38 mg/dL — ABNORMAL HIGH (ref 8–23)
CO2: 21 mmol/L — ABNORMAL LOW (ref 22–32)
Calcium: 8.6 mg/dL — ABNORMAL LOW (ref 8.9–10.3)
Chloride: 101 mmol/L (ref 98–111)
Creatinine, Ser: 0.95 mg/dL (ref 0.44–1.00)
GFR, Estimated: >60 mL/min (ref >60)
Glucose, Bld: 110 mg/dL — ABNORMAL HIGH (ref 70–99)
Potassium: 4.3 mmol/L (ref 3.5–5.1)
Sodium: 137 mmol/L (ref 135–145)

## 2022-04-04 LAB — URINALYSIS, ROUTINE W REFLEX MICROSCOPIC
Bilirubin Urine: NEGATIVE
Glucose, UA: >=500 mg/dL — AB
Hgb urine dipstick: NEGATIVE
Ketones, ur: NEGATIVE mg/dL
Leukocytes,Ua: NEGATIVE
Nitrite: NEGATIVE
Protein, ur: 30 mg/dL — AB
Specific Gravity, Urine: 1.015 (ref 1.005–1.030)
pH: 7 (ref 5.0–8.0)

## 2022-04-04 LAB — CBC
HCT: 25.8 % — ABNORMAL LOW (ref 36.0–46.0)
Hemoglobin: 8.4 g/dL — ABNORMAL LOW (ref 12.0–15.0)
MCH: 28.3 pg (ref 26.0–34.0)
MCHC: 32.6 g/dL (ref 30.0–36.0)
MCV: 86.9 fL (ref 80.0–100.0)
Platelets: 99 10*3/uL — ABNORMAL LOW (ref 150–400)
RBC: 2.97 MIL/uL — ABNORMAL LOW (ref 3.87–5.11)
RDW: 17.2 % — ABNORMAL HIGH (ref 11.5–15.5)
WBC: 19.1 10*3/uL — ABNORMAL HIGH (ref 4.0–10.5)
nRBC: 0 % (ref 0.0–0.2)

## 2022-04-04 LAB — MAGNESIUM: Magnesium: 2.3 mg/dL (ref 1.7–2.4)

## 2022-04-04 LAB — PHOSPHORUS: Phosphorus: 3.6 mg/dL (ref 2.5–4.6)

## 2022-04-04 LAB — GLUCOSE, CAPILLARY
Glucose-Capillary: 113 mg/dL — ABNORMAL HIGH (ref 70–99)
Glucose-Capillary: 120 mg/dL — ABNORMAL HIGH (ref 70–99)
Glucose-Capillary: 151 mg/dL — ABNORMAL HIGH (ref 70–99)
Glucose-Capillary: 158 mg/dL — ABNORMAL HIGH (ref 70–99)
Glucose-Capillary: 160 mg/dL — ABNORMAL HIGH (ref 70–99)
Glucose-Capillary: 193 mg/dL — ABNORMAL HIGH (ref 70–99)

## 2022-04-04 MED ORDER — PROSOURCE TF PO LIQD
45.0000 mL | Freq: Two times a day (BID) | ORAL | Status: DC
  Administered 2022-04-04 – 2022-04-22 (×36): 45 mL
  Filled 2022-04-04 (×38): qty 45

## 2022-04-04 MED ORDER — GLUCERNA 1.5 CAL PO LIQD
1000.0000 mL | ORAL | Status: DC
Start: 2022-04-04 — End: 2022-04-20
  Administered 2022-04-04 – 2022-04-19 (×13): 1000 mL
  Filled 2022-04-04 (×18): qty 1000

## 2022-04-04 NOTE — Progress Notes (Signed)
Speech Language Pathology Treatment: Dysphagia;Cognitive-Linquistic  Patient Details Name: Tara [name] MRN: [id number] DOB: 07-26-1946 Today's Date: 04/04/2022 Time: 1191-4782 SLP Time Calculation (min) (ACUTE ONLY): 8 min  Assessment / Plan / Recommendation Clinical Impression  Pt is less responsive today, minimal verbalizations, coughing a bit a baseline. Increased cueing needed for pt to recognize sips, including dripping water onto lips. Pt demonstrated coughing after sips; coordination appeared more impaired today. At the end of session pt did open her eyes and gaze moved firmly to the right.  Pt has not been given any meals that RN is aware of. With mentation somewhat declining going into the weekend, recommend making pt NPO at this time with SLP to f/u next week for more consistent improvement.     HPI HPI: Pt is a 76 y/o who presented 6/24 with new onset seizures. 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 EEG negative for seizures. Neurosurgey consulted and did not recommend meningioma resection as of 6/27. Decline in swallow function noted overnight 6/27. Dx acute toxic metabolic encephalopathy suspected secondary to keppra. Pt s/p Bifrontal craniotomy for resection of meningioma 7/3. PMH: HTN, HLD, GERD, CKD stage IIIb, chronic HFpEF, meningioma, breast CA-s/p right lumpectomy and chemoradiation in 2008, depression/anxiety. BSE 03/11/22: regular texture diet and thin liquids without need for follow up.      SLP Plan  Continue with current plan of care      Recommendations for follow up therapy are one component of a multi-disciplinary discharge planning process, led by the attending physician.  Recommendations may be updated based on patient status, additional functional criteria and insurance authorization.    Recommendations  Diet recommendations: NPO                Oral Care Recommendations: Oral care BID Follow Up  Recommendations: Acute inpatient rehab (3hours/day) Assistance recommended at discharge: Frequent or constant Supervision/Assistance Plan: Continue with current plan of care           Edwena Mayorga, [name]  04/04/2022, 12:04 PM

## 2022-04-04 NOTE — Progress Notes (Signed)
NAME:  Tara [name], MRN:  [id number], DOB:  1946-01-06, LOS: 66 ADMISSION DATE:  03/15/2022, CONSULTATION DATE:  04/04/22 REFERRING MD:  Christella Noa, CHIEF COMPLAINT:  Meningioma, Seizure   History of Present Illness:  Tara [name] is a 76 y.o. F with PMH significant for Anxiety, Depression, T2DM, HTN, HL, cardiomyopathy, Atrial Fibrillation on Eliquis, CKD IIIb, breast Ca s/p chemoradiation in 2008 who presented to the ED 6/24 with new onset seizures.   She had a known planum sphenoidal meningioma that was increased in size on CT with surrounding edema, so was transferred to [facility] for further evaluation.  LTM showed no further seizures.  She was evaluated by neurosurgery and underwent bifrontal craniotomy for meningioma resection on 7/3 and was transferred to intensive care post-op with PCCM consult while in ICU.    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   Significant Hospital Events: Including procedures, antibiotic start and stop dates in addition to other pertinent events   6/24 presented to [facility] ED with new onset seizures, CTH with enlarging meningioma with edema, loaded with Keppra 7/3 Underwent bifrontal craniotomy for meningioma, monitor in ICU 7/5 stable, no further seizure activity 7/6 less responsive, two episodes of vomiting overnight and increased oral secretions today.  Continuous blinking on exam. CTH  yesterday with new 7 mm thick subdural hematoma at the foramen magnum and tracking adjacent to the basion and odontoid not currently causing critical stenosis of the foramen magnum per radiology read, but merits surveillance. 7/7 Better night and more awake and responsive, repeat head CT unchanged and no seizures on video EEG 7/9 Recurrent seizure overnight requiring '4mg'$  of IV ativan to break. This am less responsive with forced right gaze  7/10 no further seizure activity, awake, responding, no forced gaze 7/11 recurrent sz like activity. Not correlated on EEG. CT unchanged.    Interim History / Subjective:  Coughing overnight. More tired this morning.   Objective   Blood pressure 121/81, pulse 66, temperature 98.6 F (37 C), temperature source Oral, resp. rate 15, height '4\' 9"'$  (1.448 m), weight 84.9 kg, SpO2 95 %.        Intake/Output Summary (Last 24 hours) at 04/04/2022 0826 Last data filed at 04/04/2022 0600 Gross per 24 hour  Intake 1110 ml  Output 1050 ml  Net 60 ml    Filed Weights   03/15/22 0130 04/02/22 0500  Weight: 85 kg 84.9 kg    General:  Elderly female resting comfortably in bed HEENT: craniotomy incision clean and dry.  Neuro: Somnolent, but easily arouses and briefly responds appropriately.  CV: RRR, no MRG PULM:  clear no distress GI: distended. Non-tender. Ecchymotic areas presumably from Extremities: No acute deformity Skin: grossly intact.   Labs reviewed: WBC 10 >16 >19 Hgb 8.4 Plt 81 > 99 Creatinine 0.95 Glu 145  Resolved Hospital Problem list   AKI  Assessment & Plan:   Sphenoidal Meningioma, enlarging with new vasogenic edema and new onset seizures: Post-op day 7/3 s/p bifrontal craniotomy, EEG negative  Has had several episodes of seizure-like activity post-op most likely 2/2  cerebrum disturbed by resection Ongoing absence seizure type activity as well as lip smacking and facial twitching. None of which has been proven sz on EEG.  Repeat  CTH's have been without change -Primary management per NSGY  -Appreciate neurology/epileptology -Seizure precautions with PRN benzodiazepines -Decadron taper in place, reduced to '4mg'$  qd 7/10, will plan to DC tomorrow.  -Keppra, Vimpat. Appreciate neurology -Ongoing PT  Leukocytosis, Afebrile  WBC up to 19 today 7/14. She is on steroids, but this is an acute change. Will monitor closely.  -completed 5 day course of Unasyn for aspiration 7/11 - Send UA. Get CXR, KUB - Defer further ABX for now  Thrombocytopenia - slowly improving  ABLA Acute on chronic thrombocytopenia No evidence of active bleeding.  - Continue to trend CBC   HTN HFpEF, chronic Atrial Fibrillation Echo 6/9 with EF 55% and Grade I diastolic dysfunction - Strict intake and output  - Closely monitor renal function and electrolytes  - Eliquis restarted 7/13 - Continue Amiodarone, Coreg and Crestor.  Type 2 DM - Continue SSI - CBG goal 140-180  GERD P: - Continue PPI   Nutrition - appreciate SLP - Continue tube feeds.  Best Practice (right click and "Reselect all SmartList Selections" daily)   Diet/type: tubefeeds DVT prophylaxis: SCD GI prophylaxis: N/A Lines: N/A Foley:  N/A Code Status:  full code Last date of multidisciplinary goals of care discussion: Discussion per primary, husband updated at the bedside  Critical care: NA    [name], AGACNP-BC [place] for personal pager PCCM on call pager [phone] until 7pm. Please call Elink 7p-7a. [phone]  04/04/2022 8:26 AM

## 2022-04-04 NOTE — Progress Notes (Signed)
Patient ID: Tara [name], female   DOB: 02-13-1946, 76 y.o.   MRN: [id number] BP 114/60 (BP Location: Left Leg)   Pulse 64   Temp 97.6 F (36.4 C) (Axillary)   Resp 14   Ht '4\' 9"'$  (1.448 m)   Wt 84.9 kg   SpO2 93%   BMI 40.50 kg/m  Lethargic, needed stimulation to respond by voice today.  Had lateral gaze earlier, not at this time.  Perrl Facies symmetric Continue with current measures. White count increasing while decadron is now done is worrisome. Appreciate ccm

## 2022-04-05 DIAGNOSIS — R569 Unspecified convulsions: Secondary | ICD-10-CM | POA: Diagnosis not present

## 2022-04-05 DIAGNOSIS — I482 Chronic atrial fibrillation, unspecified: Secondary | ICD-10-CM | POA: Diagnosis not present

## 2022-04-05 DIAGNOSIS — E440 Moderate protein-calorie malnutrition: Secondary | ICD-10-CM | POA: Diagnosis not present

## 2022-04-05 DIAGNOSIS — D329 Benign neoplasm of meninges, unspecified: Secondary | ICD-10-CM | POA: Diagnosis not present

## 2022-04-05 LAB — GLUCOSE, CAPILLARY
Glucose-Capillary: 117 mg/dL — ABNORMAL HIGH (ref 70–99)
Glucose-Capillary: 131 mg/dL — ABNORMAL HIGH (ref 70–99)
Glucose-Capillary: 133 mg/dL — ABNORMAL HIGH (ref 70–99)
Glucose-Capillary: 132 mg/dL — ABNORMAL HIGH (ref 70–99)
Glucose-Capillary: 91 mg/dL (ref 70–99)

## 2022-04-05 LAB — BASIC METABOLIC PANEL
Anion gap: 8 (ref 5–15)
BUN: 38 mg/dL — ABNORMAL HIGH (ref 8–23)
CO2: 24 mmol/L (ref 22–32)
Calcium: 8.5 mg/dL — ABNORMAL LOW (ref 8.9–10.3)
Chloride: 106 mmol/L (ref 98–111)
Creatinine, Ser: 1 mg/dL (ref 0.44–1.00)
GFR, Estimated: 58 mL/min — ABNORMAL LOW (ref >60)
Glucose, Bld: 128 mg/dL — ABNORMAL HIGH (ref 70–99)
Potassium: 4.2 mmol/L (ref 3.5–5.1)
Sodium: 138 mmol/L (ref 135–145)

## 2022-04-05 LAB — CBC
HCT: 23.9 % — ABNORMAL LOW (ref 36.0–46.0)
Hemoglobin: 7.9 g/dL — ABNORMAL LOW (ref 12.0–15.0)
MCH: 28.8 pg (ref 26.0–34.0)
MCHC: 33.1 g/dL (ref 30.0–36.0)
MCV: 87.2 fL (ref 80.0–100.0)
Platelets: 87 10*3/uL — ABNORMAL LOW (ref 150–400)
RBC: 2.74 MIL/uL — ABNORMAL LOW (ref 3.87–5.11)
RDW: 17.2 % — ABNORMAL HIGH (ref 11.5–15.5)
WBC: 16.3 10*3/uL — ABNORMAL HIGH (ref 4.0–10.5)
nRBC: 0 % (ref 0.0–0.2)

## 2022-04-05 LAB — MAGNESIUM: Magnesium: 2.4 mg/dL (ref 1.7–2.4)

## 2022-04-05 LAB — PHOSPHORUS: Phosphorus: 4 mg/dL (ref 2.5–4.6)

## 2022-04-05 NOTE — Progress Notes (Addendum)
Subjective: Patient reports some headache  Objective: Vital signs in last 24 hours: Temp:  [97.6 F (36.4 C)-98.4 F (36.9 C)] 97.7 F (36.5 C) (07/15 0400) Pulse Rate:  [59-71] 65 (07/15 0600) Resp:  [12-28] 12 (07/15 0600) BP: (93-137)/(53-77) 114/53 (07/15 0500) SpO2:  [89 %-95 %] 94 % (07/15 0600)  Intake/Output from previous day: 07/14 0701 - 07/15 0700 In: 1012.4 [NG/GT:1012.4] Out: 1000 [Urine:1000] Intake/Output this shift: No intake/output data recorded.  She arouses fairly easily and follows commands, her left grip is a little weaker than her right there is some swelling in the left hand.  Her incision is clean dry and intact without signs of infection.  Overall she appears to be stable.  Lab Results: Lab Results  Component Value Date   WBC 16.3 (H) 04/05/2022   HGB 7.9 (L) 04/05/2022   HCT 23.9 (L) 04/05/2022   MCV 87.2 04/05/2022   PLT 87 (L) 04/05/2022   Lab Results  Component Value Date   INR 1.1 02/27/2022   BMET Lab Results  Component Value Date   NA 138 04/05/2022   K 4.2 04/05/2022   CL 106 04/05/2022   CO2 24 04/05/2022   GLUCOSE 128 (H) 04/05/2022   BUN 38 (H) 04/05/2022   CREATININE 1.00 04/05/2022   CALCIUM 8.5 (L) 04/05/2022    Studies/Results: DG Abd 1 View  Result Date: 04/04/2022 CLINICAL DATA:  Abdominal distension EXAM: ABDOMEN - 1 VIEW COMPARISON:  03/28/2022 FINDINGS: Enteric tube is present with tip overlying the distal stomach or proximal duodenum. Mild gaseous distension of colon. Cholecystectomy clips. IMPRESSION: Mild gaseous distention of colon. Electronically Signed   By: Macy Mis M.D.   On: 04/04/2022 11:16   DG CHEST PORT 1 VIEW  Result Date: 04/04/2022 CLINICAL DATA:  28768 abdominal distention EXAM: PORTABLE CHEST 1 VIEW COMPARISON:  March 27, 2022 FINDINGS: There has been interval removal of the right transjugular central venous catheter and interval insertion of a feeding tube and its distal end is not included in  the image appears to be in the body of the stomach. Mild cardiomegaly. Again seen are the calcified mediastinal and hilar lymph nodes. Lungs remain clear. Postsurgical changes of the right axilla. The visualized skeletal structures are unremarkable. IMPRESSION: 1. There has been interval removal of the right transjugular central venous catheter and interval insertion of feeding tube with its tip is below the diaphragm appears to be in the body of the stomach. 2.  Stable mild cardiomegaly.  Lungs are clear. Electronically Signed   By: Frazier Richards M.D.   On: 04/04/2022 11:07    Assessment/Plan: Overall she appears to be stable.  White count 16 down from 19.  Continue to mobilize.  Estimated body mass index is 40.5 kg/m as calculated from the following:   Height as of this encounter: '4\' 9"'$  (1.448 m).   Weight as of this encounter: 84.9 kg.    LOS: 21 days    Eustace Moore 04/05/2022, 8:27 AM

## 2022-04-05 NOTE — Progress Notes (Signed)
Noted patient had left gaze deviation after cleaning up patient and changing bedding. Patient does not blink to threat, and not responding or following commands. Notified Dr. Tacy Learn with no new orders at this time. Episode lasted 5 minutes. Patient now sleeping but rousable to voice. Will continue to monitor.

## 2022-04-05 NOTE — Progress Notes (Signed)
NAME:  Tara [name], MRN:  [id number], DOB:  [birth year], LOS: 21 ADMISSION DATE:  03/15/2022, CONSULTATION DATE:  04/05/22 REFERRING MD:  Christella Noa, CHIEF COMPLAINT:  Meningioma, Seizure   History of Present Illness:  Tara [name] is a 76 y.o. F with PMH significant for Anxiety, Depression, T2DM, HTN, HL, cardiomyopathy, Atrial Fibrillation on Eliquis, CKD IIIb, breast Ca s/p chemoradiation in 2008 who presented to the ED 6/24 with new onset seizures.   She had a known planum sphenoidal meningioma that was increased in size on CT with surrounding edema, so was transferred to [facility] for further evaluation.  LTM showed no further seizures.  She was evaluated by neurosurgery and underwent bifrontal craniotomy for meningioma resection on 7/3 and was transferred to intensive care post-op with PCCM consult while in ICU.    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   Significant Hospital Events: Including procedures, antibiotic start and stop dates in addition to other pertinent events   6/24 presented to [facility] ED with new onset seizures, CTH with enlarging meningioma with edema, loaded with Keppra 7/3 Underwent bifrontal craniotomy for meningioma, monitor in ICU 7/5 stable, no further seizure activity 7/6 less responsive, two episodes of vomiting overnight and increased oral secretions today.  Continuous blinking on exam. CTH  yesterday with new 7 mm thick subdural hematoma at the foramen magnum and tracking adjacent to the basion and odontoid not currently causing critical stenosis of the foramen magnum per radiology read, but merits surveillance. 7/7 Better night and more awake and responsive, repeat head CT unchanged and no seizures on video EEG 7/9 Recurrent seizure overnight requiring '4mg'$  of IV ativan to break. This am less responsive with forced right gaze  7/10 no further seizure activity, awake, responding, no forced gaze 7/11 recurrent sz like activity. Not correlated on EEG. CT unchanged.    Interim History / Subjective:  White count is trending down Patient stated cough is better today She slept well last night  Objective   Blood pressure 135/74, pulse 65, temperature 98.4 F (36.9 C), temperature source Axillary, resp. rate 16, height '4\' 9"'$  (1.448 m), weight 84.9 kg, SpO2 95 %.        Intake/Output Summary (Last 24 hours) at 04/05/2022 1049 Last data filed at 04/05/2022 0600 Gross per 24 hour  Intake 832.42 ml  Output 1000 ml  Net -167.58 ml   Filed Weights   03/15/22 0130 04/02/22 0500  Weight: 85 kg 84.9 kg    General:  Elderly female, lying in the bed HEENT: craniotomy incision clean and dry.  Neuro: Much more awake today, opens eyes to vocal stimuli, following simple commands CV: RRR, no MRG PULM:  clear no distress GI: Soft, nondistended, Non-tender. Ecchymotic areas presumably from Extremities: No acute deformity Skin: grossly intact.   Labs reviewed: WBC 10 >16 >19 >16 Hgb 8.4 >7.9 Plt 81 > 99 >87 Creatinine 1 Glu 128  Resolved Hospital Problem list   AKI  Assessment & Plan:   Sphenoidal Meningioma, enlarging with new vasogenic edema and new onset seizures: Post-op day 7/3 s/p bifrontal  craniotomy, EEG negative  Has had several episodes of seizure-like activity post-op  Ongoing absence seizure type activity as well as lip smacking and facial twitching. None of which has  been proven sz on EEG.  Repeat CTH's have been without change Neurosurgery is following Continue Keppra and Vimpat per neurology recommendations Seizure precautions with PRN benzodiazepines Midodrine was tapered off Continue PT/OT evaluation  Leukocytosis, Afebrile  WBC has trended down to 16 today  She is off steroid now No signs of active infection UA is negative as well as x-ray chest was done yesterday showing no acute changes   Thrombocytopenia -stable ABLA Acute on chronic thrombocytopenia No evidence of active bleeding.  Continue to trend CBC   HTN HFpEF, chronic Paroxysmal Atrial Fibrillation Echo 6/9 with EF 55% and Grade I diastolic dysfunction Monitor intake and output Closely monitor renal function and electrolytes  Continue Eliquis for stroke prophylaxis Continue Amiodarone, Coreg and Crestor.  Type 2 DM Blood sugars are controlled Continue SSI with CBG goal 140-180  GERD Continue Protonix  Dysphagia Continue tube feeds  Best Practice (right click and "Reselect all SmartList Selections" daily)   Diet/type: tubefeeds DVT prophylaxis: Eliquis GI prophylaxis: N/A Lines: N/A Foley:  N/A Code Status:  full code Last date of multidisciplinary goals of care discussion: Discussion per primary, husband updated at the bedside  Critical care: NA     Jacky Kindle, MD [place] for pager If no response to pager, please call [phone] until 7pm After 7pm, Please call E-link [phone]

## 2022-04-06 DIAGNOSIS — E440 Moderate protein-calorie malnutrition: Secondary | ICD-10-CM | POA: Diagnosis not present

## 2022-04-06 DIAGNOSIS — I482 Chronic atrial fibrillation, unspecified: Secondary | ICD-10-CM | POA: Diagnosis not present

## 2022-04-06 DIAGNOSIS — R569 Unspecified convulsions: Secondary | ICD-10-CM | POA: Diagnosis not present

## 2022-04-06 DIAGNOSIS — D329 Benign neoplasm of meninges, unspecified: Secondary | ICD-10-CM | POA: Diagnosis not present

## 2022-04-06 LAB — BASIC METABOLIC PANEL
Anion gap: 14 (ref 5–15)
BUN: 38 mg/dL — ABNORMAL HIGH (ref 8–23)
CO2: 18 mmol/L — ABNORMAL LOW (ref 22–32)
Calcium: 8.2 mg/dL — ABNORMAL LOW (ref 8.9–10.3)
Chloride: 105 mmol/L (ref 98–111)
Creatinine, Ser: 0.91 mg/dL (ref 0.44–1.00)
GFR, Estimated: >60 mL/min (ref >60)
Glucose, Bld: 102 mg/dL — ABNORMAL HIGH (ref 70–99)
Potassium: 4.5 mmol/L (ref 3.5–5.1)
Sodium: 137 mmol/L (ref 135–145)

## 2022-04-06 LAB — CBC
HCT: 24.6 % — ABNORMAL LOW (ref 36.0–46.0)
Hemoglobin: 7.9 g/dL — ABNORMAL LOW (ref 12.0–15.0)
MCH: 28.4 pg (ref 26.0–34.0)
MCHC: 32.1 g/dL (ref 30.0–36.0)
MCV: 88.5 fL (ref 80.0–100.0)
Platelets: 93 10*3/uL — ABNORMAL LOW (ref 150–400)
RBC: 2.78 MIL/uL — ABNORMAL LOW (ref 3.87–5.11)
RDW: 17.3 % — ABNORMAL HIGH (ref 11.5–15.5)
WBC: 11.4 10*3/uL — ABNORMAL HIGH (ref 4.0–10.5)
nRBC: 0 % (ref 0.0–0.2)

## 2022-04-06 LAB — GLUCOSE, CAPILLARY
Glucose-Capillary: 107 mg/dL — ABNORMAL HIGH (ref 70–99)
Glucose-Capillary: 117 mg/dL — ABNORMAL HIGH (ref 70–99)
Glucose-Capillary: 119 mg/dL — ABNORMAL HIGH (ref 70–99)
Glucose-Capillary: 126 mg/dL — ABNORMAL HIGH (ref 70–99)
Glucose-Capillary: 132 mg/dL — ABNORMAL HIGH (ref 70–99)
Glucose-Capillary: 148 mg/dL — ABNORMAL HIGH (ref 70–99)
Glucose-Capillary: 156 mg/dL — ABNORMAL HIGH (ref 70–99)

## 2022-04-06 MED ORDER — APIXABAN 5 MG PO TABS
5.0000 mg | ORAL_TABLET | Freq: Two times a day (BID) | ORAL | Status: DC
Start: 2022-04-06 — End: 2022-04-06
  Filled 2022-04-06: qty 1

## 2022-04-06 MED ORDER — APIXABAN 5 MG PO TABS
5.0000 mg | ORAL_TABLET | Freq: Two times a day (BID) | ORAL | Status: DC
  Administered 2022-04-06 – 2022-04-12 (×13): 5 mg
  Filled 2022-04-06 (×12): qty 1

## 2022-04-06 NOTE — Progress Notes (Signed)
NEUROSURGERY PROGRESS NOTE   S/p bifrontal crani for meningioma resection. Patient is stable and her white count is trending down to 11 now. I think she is stable to be transferred to progressive today.   Temp:  [97.6 F (36.4 C)-98.5 F (36.9 C)] 98 F (36.7 C) (07/16 0400) Pulse Rate:  [61-79] 79 (07/16 0700) Resp:  [13-26] 18 (07/16 0700) BP: (108-148)/(52-74) 123/71 (07/16 0700) SpO2:  [91 %-96 %] 93 % (07/16 0700) Weight:  [77.1 kg] 77.1 kg (07/16 0459)    Eleonore Chiquito, NP 04/06/2022 8:01 AM

## 2022-04-06 NOTE — Progress Notes (Signed)
NAME:  Tara [name], MRN:  [id number], DOB:  1946/05/30, LOS: 88 ADMISSION DATE:  03/15/2022, CONSULTATION DATE:  04/06/22 REFERRING MD:  Christella Noa, CHIEF COMPLAINT:  Meningioma, Seizure   History of Present Illness:  Tara [name] is a 76 y.o. F with PMH significant for Anxiety, Depression, T2DM, HTN, HL, cardiomyopathy, Atrial Fibrillation on Eliquis, CKD IIIb, breast Ca s/p chemoradiation in 2008 who presented to the ED 6/24 with new onset seizures.   She had a known planum sphenoidal meningioma that was increased in size on CT with surrounding edema, so was transferred to [facility] for further evaluation.  LTM showed no further seizures.  She was evaluated by neurosurgery and underwent bifrontal craniotomy for meningioma resection on 7/3 and was transferred to intensive care post-op with PCCM consult while in ICU.    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   Significant Hospital Events: Including procedures, antibiotic start and stop dates in addition to other pertinent events   6/24 presented to [facility] ED with new onset seizures, CTH with enlarging meningioma with edema, loaded with Keppra 7/3 Underwent bifrontal craniotomy for meningioma, monitor in ICU 7/5 stable, no further seizure activity 7/6 less responsive, two episodes of vomiting overnight and increased oral secretions today.  Continuous blinking on exam. CTH  yesterday with new 7 mm thick subdural hematoma at the foramen magnum and tracking adjacent to the basion and odontoid not currently causing critical stenosis of the foramen magnum per radiology read, but merits surveillance. 7/7 Better night and more awake and responsive, repeat head CT unchanged and no seizures on video EEG 7/9 Recurrent seizure overnight requiring '4mg'$  of IV ativan to break. This am less responsive with forced right gaze  7/10 no further seizure activity, awake, responding, no forced gaze 7/11 recurrent sz like activity. Not correlated on EEG. CT unchanged.   Interim History / Subjective:  White count is trending down She slept well last night  Objective   Blood pressure 120/76, pulse 77, temperature 99.6 F (37.6 C), temperature source Axillary, resp. rate 17, height '4\' 9"'$  (1.448 m), weight 77.1 kg, SpO2 93 %.        Intake/Output Summary (Last 24 hours) at 04/06/2022 0917 Last data filed at 04/06/2022 0800 Gross per 24 hour  Intake 520 ml  Output 1450 ml  Net -930 ml   Filed Weights   04/02/22 0500 04/05/22 2200 04/06/22 0459  Weight: 84.9 kg 77.1 kg 77.1 kg    General:  Elderly female, lying in the bed HEENT: craniotomy incision clean and dry.  Neuro: Lethargic but responding to vocal stimuli, following simple commands intermittently CV: RRR, no MRG PULM:  clear no distress GI: Soft, nondistended, Non-tender. Ecchymotic areas presumably from Extremities: No acute deformity Skin: grossly intact.  Some mild ecchymosis is noted around the left eye, improving  Labs reviewed: WBC 11.4 Hgb 7.9 Plt 93 Creatinine .9 Glu 102  Resolved Hospital Problem list   AKI  Assessment & Plan:   Sphenoidal Meningioma, enlarging with new vasogenic edema and new onset seizures: Post-op day 7/3 s/p bifrontal craniotomy, EEG  negative  Has had several episodes of seizure-like activity post-op  Ongoing absence seizure type activity as well as lip smacking and facial  twitching. None of which has been proven sz on EEG.  Neurosurgery is following Continue Keppra and Vimpat per neurology recommendations Seizure precautions with PRN benzodiazepines Midodrine was tapered off Continue PT/OT evaluation  Leukocytosis, Afebrile  WBC has trended down to 11 today  She is off steroid now No signs of active infection UA is negative as well as x-ray chest was done yesterday showing no acute changes   Thrombocytopenia -stable ABLA Acute on chronic thrombocytopenia No evidence of active bleeding.  Continue to trend CBC   HTN HFpEF, chronic Paroxysmal Atrial Fibrillation Echo 6/9 with EF 55% and Grade I diastolic dysfunction Monitor intake and output Closely monitor renal function and electrolytes  Continue Eliquis for stroke prophylaxis Continue Amiodarone, Coreg and Crestor.  Type 2 DM Blood sugars are controlled Continue SSI with CBG goal 140-180  GERD Continue Protonix  Dysphagia Continue tube feeds  Best Practice (right click and "Reselect all SmartList Selections" daily)   Diet/type: tubefeeds DVT prophylaxis: Eliquis GI prophylaxis: N/A Lines: N/A Foley:  N/A Code Status:  full code Last date of multidisciplinary goals of care discussion: Discussion per primary, husband updated at the bedside  Critical care: NA     Jacky Kindle, MD [place] for pager If no response to pager, please call [phone] until 7pm After 7pm, Please call E-link [phone]

## 2022-04-06 NOTE — Plan of Care (Signed)
  Problem: Education: Goal: Knowledge of General Education information will improve Description: Including pain rating scale, medication(s)/side effects and non-pharmacologic comfort measures 04/06/2022 2132 by Lennox Grumbles, RN Outcome: Progressing 04/06/2022 2131 by Lennox Grumbles, RN Outcome: Progressing   Problem: Health Behavior/Discharge Planning: Goal: Ability to manage health-related needs will improve Outcome: Progressing   Problem: Clinical Measurements: Goal: Ability to maintain clinical measurements within normal limits will improve Outcome: Progressing   Problem: Activity: Goal: Risk for activity intolerance will decrease Outcome: Progressing   Problem: Nutrition: Goal: Adequate nutrition will be maintained Outcome: Progressing   Problem: Education: Goal: Expressions of having a comfortable level of knowledge regarding the disease process will increase 04/06/2022 2132 by Lennox Grumbles, RN Outcome: Progressing 04/06/2022 2131 by Lennox Grumbles, RN Outcome: Progressing   Problem: Coping: Goal: Ability to adjust to condition or change in health will improve 04/06/2022 2132 by Lennox Grumbles, RN Outcome: Progressing 04/06/2022 2131 by Lennox Grumbles, RN Outcome: Progressing Goal: Ability to identify appropriate support needs will improve 04/06/2022 2132 by Lennox Grumbles, RN Outcome: Progressing 04/06/2022 2131 by Lennox Grumbles, RN Outcome: Progressing   Problem: Health Behavior/Discharge Planning: Goal: Compliance with prescribed medication regimen will improve 04/06/2022 2132 by Lennox Grumbles, RN Outcome: Progressing 04/06/2022 2131 by Lennox Grumbles, RN Outcome: Progressing

## 2022-04-07 DIAGNOSIS — I482 Chronic atrial fibrillation, unspecified: Secondary | ICD-10-CM | POA: Diagnosis not present

## 2022-04-07 DIAGNOSIS — K2100 Gastro-esophageal reflux disease with esophagitis, without bleeding: Secondary | ICD-10-CM

## 2022-04-07 DIAGNOSIS — L8990 Pressure ulcer of unspecified site, unspecified stage: Secondary | ICD-10-CM | POA: Insufficient documentation

## 2022-04-07 DIAGNOSIS — E785 Hyperlipidemia, unspecified: Secondary | ICD-10-CM

## 2022-04-07 DIAGNOSIS — F419 Anxiety disorder, unspecified: Secondary | ICD-10-CM | POA: Diagnosis not present

## 2022-04-07 DIAGNOSIS — R569 Unspecified convulsions: Secondary | ICD-10-CM | POA: Diagnosis not present

## 2022-04-07 LAB — CBC WITH DIFFERENTIAL/PLATELET
Abs Immature Granulocytes: 0.07 10*3/uL (ref 0.00–0.07)
Basophils Absolute: 0 10*3/uL (ref 0.0–0.1)
Basophils Relative: 0 %
Eosinophils Absolute: 0 10*3/uL (ref 0.0–0.5)
Eosinophils Relative: 0 %
HCT: 24.8 % — ABNORMAL LOW (ref 36.0–46.0)
Hemoglobin: 7.9 g/dL — ABNORMAL LOW (ref 12.0–15.0)
Immature Granulocytes: 1 %
Lymphocytes Relative: 3 %
Lymphs Abs: 0.2 10*3/uL — ABNORMAL LOW (ref 0.7–4.0)
MCH: 28.4 pg (ref 26.0–34.0)
MCHC: 31.9 g/dL (ref 30.0–36.0)
MCV: 89.2 fL (ref 80.0–100.0)
Monocytes Absolute: 0.1 10*3/uL (ref 0.1–1.0)
Monocytes Relative: 2 %
Neutro Abs: 7.5 10*3/uL (ref 1.7–7.7)
Neutrophils Relative %: 94 %
Platelets: 92 10*3/uL — ABNORMAL LOW (ref 150–400)
RBC: 2.78 MIL/uL — ABNORMAL LOW (ref 3.87–5.11)
RDW: 17.2 % — ABNORMAL HIGH (ref 11.5–15.5)
WBC: 8 10*3/uL (ref 4.0–10.5)
nRBC: 0 % (ref 0.0–0.2)

## 2022-04-07 LAB — COMPREHENSIVE METABOLIC PANEL
ALT: 36 U/L (ref 0–44)
AST: 24 U/L (ref 15–41)
Albumin: 2.3 g/dL — ABNORMAL LOW (ref 3.5–5.0)
Alkaline Phosphatase: 87 U/L (ref 38–126)
Anion gap: 8 (ref 5–15)
BUN: 32 mg/dL — ABNORMAL HIGH (ref 8–23)
CO2: 25 mmol/L (ref 22–32)
Calcium: 8.1 mg/dL — ABNORMAL LOW (ref 8.9–10.3)
Chloride: 103 mmol/L (ref 98–111)
Creatinine, Ser: 0.88 mg/dL (ref 0.44–1.00)
GFR, Estimated: >60 mL/min (ref >60)
Glucose, Bld: 126 mg/dL — ABNORMAL HIGH (ref 70–99)
Potassium: 3.8 mmol/L (ref 3.5–5.1)
Sodium: 136 mmol/L (ref 135–145)
Total Bilirubin: 0.5 mg/dL (ref 0.3–1.2)
Total Protein: 5.6 g/dL — ABNORMAL LOW (ref 6.5–8.1)

## 2022-04-07 LAB — GLUCOSE, CAPILLARY
Glucose-Capillary: 118 mg/dL — ABNORMAL HIGH (ref 70–99)
Glucose-Capillary: 144 mg/dL — ABNORMAL HIGH (ref 70–99)
Glucose-Capillary: 149 mg/dL — ABNORMAL HIGH (ref 70–99)
Glucose-Capillary: 123 mg/dL — ABNORMAL HIGH (ref 70–99)
Glucose-Capillary: 142 mg/dL — ABNORMAL HIGH (ref 70–99)
Glucose-Capillary: 117 mg/dL — ABNORMAL HIGH (ref 70–99)

## 2022-04-07 LAB — MAGNESIUM: Magnesium: 2.1 mg/dL (ref 1.7–2.4)

## 2022-04-07 LAB — PHOSPHORUS: Phosphorus: 3.4 mg/dL (ref 2.5–4.6)

## 2022-04-07 NOTE — Progress Notes (Addendum)
PROGRESS NOTE    Tara [name]  KZS:[id number] DOB: Jan 20, 1946 DOA: 03/15/2022 PCP: [name], MD   Brief Narrative:  Patient is a 76 year old African-American female currently with past medical history significant for but not limited to anxiety, depression, diabetes mellitus type 2, hypertension, hyperlipidemia, cardiomyopathy, history of atrial fibrillation on anticoagulation with Eliquis, chronic kidney disease stage IIIb, history of breast cancer status post chemoradiation in 2008 as well as other comorbidities who presented to the hospital on 03/15/2022 with new onset seizures.  She had a known sphenoidal meningioma that was noted to be increased in size on the CT scan with surrounding edema so she is transferred from [facility] for further evaluation.  She underwent LTM with no further seizures and was evaluated by neurosurgery and underwent bifrontal craniotomy for meningioma resection on 03/24/2020 and was transferred to the intensive care unit postoperatively with PCCM consulting while she was in ICU.  Current clinical course and significant Hospital events include the following but not limited to:  Significant Hospital Events: Including procedures, antibiotic start and stop dates in addition to other pertinent events   6/24 presented to [facility] ED with new onset seizures, CTH with enlarging meningioma with edema, loaded with Keppra 7/3 Underwent bifrontal craniotomy for meningioma, monitor in ICU 7/5 stable, no further seizure activity 7/6 less responsive, two episodes of vomiting overnight and increased oral secretions today.  Continuous blinking on exam. CTH yesterday with new 7 mm thick subdural hematoma at the foramen magnum and tracking adjacent to the basion and odontoid not currently causing critical stenosis of the foramen magnum per radiology read, but merits surveillance. 7/7 Better night and more awake and responsive, repeat head CT unchanged and no seizures on video EEG 7/9  Recurrent seizure overnight requiring '4mg'$  of IV ativan to break. This am less responsive with forced right gaze  7/10 no further seizure activity, awake, responding, no forced gaze 7/11 recurrent sz like activity. Not correlated on EEG. CT unchanged.    **Interim History White count has completely improved and resolved and she was transferred to the [facility] consulting service on 04/07/2022.  Patient was transferred to the progressive care unit.  She did have a slight temperature today at 100.5 which we will need to continue monitor carefully and work-up further if she continues to spike temperatures.  Assessment and Plan:  Sphenoidal Meningioma, enlarging with new vasogenic edema and new onset seizures -History is consistent with tonic-clonic seizure with no previous history of seizures -Neurology was consulted and recommended admission to medicine, and was loaded with Keppra -Post-op day 7/3 s/p bifrontal craniotomy, EEG negative  -Has had several episodes of seizure-like activity post-op  -Ongoing absence seizure type activity as well as lip smacking and facial twitching. None of which has been proven sz on EEG.  -Neurosurgery is following and she is status post bifrontal craniotomy for meningioma resection -Continue Levetiracetam 1000 mg per Tube BID and Lacosamide 200 mg per Tube BID per neurology recommendations -Seizure precautions with PRN benzodiazepines -Midodrine was tapered off -Patient is 2 mg of IV lorazepam every 10 minutes as needed's procedure -Continue PT/OT evaluation and they are recommend SNF -Patient was lethargic today and not able to really participate with SLP and SLP recommending not feeding patient advancing her until she is responsive -Neurosurgery recommends continue to mobilize.   Leukocytosis, improved but now has a slight Fever  -WBC has trended down from 16.3 to 11.4 yesterday and today is improved to 8.0 -She is off steroids  now -No signs of active infection  currently but did spike a temperature of 100.5 -UA is negative as well as x-ray chest was done yesterday showing no acute changes  -If she continues to spike temperatures will need blood cultures x2, urinalysis and urine culture as well as a repeat chest x-ray in the a.m.   Thrombocytopenia -stable ABLA Acute on chronic thrombocytopenia -No evidence of active bleeding.  -Patient's platelet count is relatively stable now and at 92 and hemoglobin/hematocrit is stable at 7.9/24.8 -Continue monitor for signs and symptoms bleeding; no overt bleeding noted -Repeat CBC in a.m.  Metabolic acidosis -Improved.  Patient's CO2 was 18, anion gap was 14, chloride level was 105; now CO2 25, chloride level is 103 and anion gap of 8 -Continue to monitor and trend and repeat CMP in a.m.  Anxiety and Depression -Continue with sertraline 25 mg per tube daily    HTN Chronic systolic CHF/heart failure with preserved ejection fraction Paroxysmal Atrial Fibrillation Hyperlipidemia -Echo 6/9 with EF 55% and Grade I diastolic dysfunction; previously had a normal diastolic function -Continue monitor strict intake and output -Closely monitor renal function and electrolytes  -Continue Apixaban 5 mg per tube twice daily for stroke prophylaxis -Continue Carvedilol 6.25 mg per tube twice daily, Amiodarone 200 mg p.o. per tube daily, and Rosuvastatin 20 mg per tube daily -Continue with metoprolol tartrate 5 mg IV every 4 as needed for heart rate greater than 110 as well as labetalol 10 to 40 mg IV q. 10 minutes as needed for high blood pressure as well as IV hydralazine 10 mg every 4 as needed for systolic blood pressure greater than 180   Type 2 DM -Blood sugars are controlled -Continue moderate NovoLog sliding scale insulin every 4 with CBG goal 140-180 -CBGs ranging from   GERD/GI prophylaxis -Continue pantoprazole 40 mg per tube daily   Dysphagia -Continue tube feeds per nutritionist recommendations with  Glucerna 1.5 at 1000 mL per tube at 40 MLS per hour every 24 hours as well as Prosource tube feeding liquid 45 MLS per tube twice daily Nutrition Status: Nutrition Problem: Inadequate oral intake Etiology: inability to eat Signs/Symptoms: NPO status Interventions: Tube feeding, MVI  Obesity -Complicates overall prognosis and care -Estimated body mass index is 39.41 kg/m as calculated from the following:   Height as of this encounter: '4\' 9"'$  (1.448 m).   Weight as of this encounter: 82.6 kg.  -Weight Loss and Dietary Counseling given    DVT prophylaxis: SCDs Start: 03/26/22 0840 Place TED hose Start: 03/24/22 0616 Place and maintain sequential compression device Start: 03/23/22 0631 apixaban (ELIQUIS) tablet 5 mg    Code Status: Full Code Family Communication: Discussed with husband at bedside  Disposition Plan:  Level of care: Progressive Status is: Inpatient Remains inpatient appropriate because: Needs SNF and further clearance    Consultants:  PCCM Neurosurgery Neurology  Procedures:  Please see as above  Antimicrobials:  Anti-infectives (From admission, onward)    Start     Dose/Rate Route Frequency Ordered Stop   03/27/22 0945  Ampicillin-Sulbactam (UNASYN) 3 g in sodium chloride 0.9 % 100 mL IVPB        3 g 200 mL/hr over 30 Minutes Intravenous Every 6 hours 03/27/22 0859 04/01/22 0518   03/25/22 0600  ceFAZolin (ANCEF) IVPB 2g/100 mL premix        2 g 200 mL/hr over 30 Minutes Intravenous On call to O.R. 03/24/22 1215 03/24/22 1440       Subjective:  Seen and examined at bedside and she is extremely lethargic today with her head bent down.  Did not really want to interact and did not really respond.  Husband states that she had a temperature this morning of 100.5.  Subjective history not able to be obtained from the patient currently due to her current condition.  Objective: Vitals:   04/07/22 1300 04/07/22 1400 04/07/22 1500 04/07/22 1622  BP: (!) 132/56  (!) 125/54 (!) 129/51 (!) 145/63  Pulse: 72 75 73 78  Resp: (!) 21 (!) 35 (!) 22 (!) 22  Temp:    (!) 100.5 F (38.1 C)  TempSrc:    Axillary  SpO2: 98% 99% 99% 97%  Weight:      Height:        Intake/Output Summary (Last 24 hours) at 04/07/2022 1804 Last data filed at 04/07/2022 1300 Gross per 24 hour  Intake 760 ml  Output 650 ml  Net 110 ml   Filed Weights   04/05/22 2200 04/06/22 0459 04/07/22 0500  Weight: 77.1 kg 77.1 kg 82.6 kg   Examination: Physical Exam:  Constitutional: WN/WD obese female currently in no acute distress appears lethargic and not interactive Respiratory: Diminished to auscultation bilaterally, no wheezing, rales, rhonchi or crackles. Normal respiratory effort and patient is not tachypenic. No accessory muscle use.  Unlabored breathing Cardiovascular: RRR, no murmurs / rubs / gallops. S1 and S2 auscultated.  1+ extremity edema noted Abdomen: Soft, non-tender, distended secondary body habitus. Bowel sounds positive.  GU: Deferred. Musculoskeletal: No clubbing / cyanosis of digits/nails. No joint deformity upper and lower extremities.  Skin: Forehead scar noted from her bilateral frontal meningioma resection Neurologic: Very somnolent and drowsy and does not really follow commands Psychiatric: Impaired judgment and insight.  She is very drowsy currently with nursing states that she was awake and alert in the morning  Data Reviewed: I have personally reviewed following labs and imaging studies  CBC: Recent Labs  Lab 04/03/22 0635 04/04/22 0640 04/05/22 0240 04/06/22 0322 04/07/22 0915  WBC 16.4* 19.1* 16.3* 11.4* 8.0  NEUTROABS  --   --   --   --  7.5  HGB 9.0* 8.4* 7.9* 7.9* 7.9*  HCT 28.8* 25.8* 23.9* 24.6* 24.8*  MCV 89.7 86.9 87.2 88.5 89.2  PLT 99* 99* 87* 93* 92*   Basic Metabolic Panel: Recent Labs  Lab 04/03/22 0635 04/04/22 0502 04/05/22 0240 04/06/22 0322 04/07/22 0915  NA 141 137 138 137 136  K 4.2 4.3 4.2 4.5 3.8  CL 103  101 106 105 103  CO2 25 21* 24 18* 25  GLUCOSE 156* 110* 128* 102* 126*  BUN 38* 38* 38* 38* 32*  CREATININE 1.07* 0.95 1.00 0.91 0.88  CALCIUM 8.8* 8.6* 8.5* 8.2* 8.1*  MG  --  2.3 2.4  --  2.1  PHOS  --  3.6 4.0  --  3.4   GFR: Estimated Creatinine Clearance: 48.3 mL/min (by C-G formula based on SCr of 0.88 mg/dL). Liver Function Tests: Recent Labs  Lab 04/07/22 0915  AST 24  ALT 36  ALKPHOS 87  BILITOT 0.5  PROT 5.6*  ALBUMIN 2.3*   No results for input(s): "LIPASE", "AMYLASE" in the last 168 hours. No results for input(s): "AMMONIA" in the last 168 hours. Coagulation Profile: No results for input(s): "INR", "PROTIME" in the last 168 hours. Cardiac Enzymes: No results for input(s): "CKTOTAL", "CKMB", "CKMBINDEX", "TROPONINI" in the last 168 hours. BNP (last 3 results) No results for input(s): "PROBNP" in  the last 8760 hours. HbA1C: No results for input(s): "HGBA1C" in the last 72 hours. CBG: Recent Labs  Lab 04/06/22 2334 04/07/22 0329 04/07/22 0800 04/07/22 1203 04/07/22 1616  GLUCAP 148* 118* 144* 149* 123*   Lipid Profile: No results for input(s): "CHOL", "HDL", "LDLCALC", "TRIG", "CHOLHDL", "LDLDIRECT" in the last 72 hours. Thyroid Function Tests: No results for input(s): "TSH", "T4TOTAL", "FREET4", "T3FREE", "THYROIDAB" in the last 72 hours. Anemia Panel: No results for input(s): "VITAMINB12", "FOLATE", "FERRITIN", "TIBC", "IRON", "RETICCTPCT" in the last 72 hours. Sepsis Labs: No results for input(s): "PROCALCITON", "LATICACIDVEN" in the last 168 hours.  No results found for this or any previous visit (from the past 240 hour(s)).   Radiology Studies: No results found.  Scheduled Meds:  amiodarone  200 mg Per Tube Daily   apixaban  5 mg Per Tube BID   carvedilol  6.25 mg Per Tube BID WC   Chlorhexidine Gluconate Cloth  6 each Topical Daily   feeding supplement (GLUCERNA 1.5 CAL)  1,000 mL Per Tube Q24H   feeding supplement (PROSource TF)  45 mL  Per Tube BID   insulin aspart  0-15 Units Subcutaneous Q4H   insulin glargine-yfgn  10 Units Subcutaneous Daily   lacosamide  200 mg Per Tube BID   levETIRAcetam  1,000 mg Per Tube BID   montelukast  10 mg Per Tube QHS   multivitamin with minerals  1 tablet Per Tube Daily   mouth rinse  15 mL Mouth Rinse 4 times per day   pantoprazole sodium  40 mg Per Tube Daily   polyethylene glycol  17 g Per NG tube Daily   rosuvastatin  20 mg Per Tube Daily   senna  1 tablet Per Tube BID   sertraline  25 mg Per Tube Daily   Continuous Infusions:   LOS: 23 days   Raiford Noble, DO [facility] Available via Epic secure chat 7am-7pm After these hours, please refer to coverage provider listed on [email or web address] 04/07/2022, 6:04 PM

## 2022-04-07 NOTE — Progress Notes (Signed)
Patient ID: Tara [name], female   DOB: 03/26/1946, 76 y.o.   MRN: [id number] BP (!) 91/51 (BP Location: Left Arm)   Pulse 76   Temp 98.7 F (37.1 C) (Axillary)   Resp 18   Ht '4\' 9"'$  (1.448 m)   Wt 82.6 kg   SpO2 92%   BMI 39.41 kg/m  Alert this morning, non fluent but speaking.  Moving all extremities [name] had a planum sphenoidale meningoma, not a sphenoidal meningioma.  Appreciate medicine assistance.  Neurologically stable.

## 2022-04-07 NOTE — Progress Notes (Signed)
SLP Cancellation Note  Patient Details Name: Tara [name] MRN: [id number] DOB: 1946-05-25   Cancelled treatment:       Reason Eval/Treat Not Completed: Patient's level of consciousness. Pt did not verbally respond or follow commands. Eyes open gaze fixed to the right, could not track to midline or left. Cannot feed pt or advance her unless she is responsive.    Ivy Meriwether, [name] 04/07/2022, 11:57 AM

## 2022-04-07 NOTE — Plan of Care (Signed)
Problem: Education: Goal: Ability to describe self-care measures that may prevent or decrease complications (Diabetes Survival Skills Education) will improve 04/07/2022 2251 by Gwyndolyn Kaufman, RN Outcome: Progressing 04/07/2022 2251 by Gwyndolyn Kaufman, RN Outcome: Progressing Goal: Individualized Educational Video(s) 04/07/2022 2251 by Gwyndolyn Kaufman, RN Outcome: Progressing 04/07/2022 2251 by Gwyndolyn Kaufman, RN Outcome: Progressing   Problem: Coping: Goal: Ability to adjust to condition or change in health will improve 04/07/2022 2251 by Gwyndolyn Kaufman, RN Outcome: Progressing 04/07/2022 2251 by Gwyndolyn Kaufman, RN Outcome: Progressing   Problem: Fluid Volume: Goal: Ability to maintain a balanced intake and output will improve 04/07/2022 2251 by Gwyndolyn Kaufman, RN Outcome: Progressing 04/07/2022 2251 by Gwyndolyn Kaufman, RN Outcome: Progressing   Problem: Health Behavior/Discharge Planning: Goal: Ability to identify and utilize available resources and services will improve 04/07/2022 2251 by Gwyndolyn Kaufman, RN Outcome: Progressing 04/07/2022 2251 by Gwyndolyn Kaufman, RN Outcome: Progressing Goal: Ability to manage health-related needs will improve 04/07/2022 2251 by Gwyndolyn Kaufman, RN Outcome: Progressing 04/07/2022 2251 by Gwyndolyn Kaufman, RN Outcome: Progressing   Problem: Metabolic: Goal: Ability to maintain appropriate glucose levels will improve 04/07/2022 2251 by Gwyndolyn Kaufman, RN Outcome: Progressing 04/07/2022 2251 by Gwyndolyn Kaufman, RN Outcome: Progressing   Problem: Nutritional: Goal: Maintenance of adequate nutrition will improve 04/07/2022 2251 by Gwyndolyn Kaufman, RN Outcome: Progressing 04/07/2022 2251 by Gwyndolyn Kaufman, RN Outcome: Progressing Goal: Progress toward achieving an optimal weight will improve 04/07/2022 2251 by Gwyndolyn Kaufman, RN Outcome: Progressing 04/07/2022 2251 by Gwyndolyn Kaufman, RN Outcome: Progressing   Problem:  Skin Integrity: Goal: Risk for impaired skin integrity will decrease 04/07/2022 2251 by Gwyndolyn Kaufman, RN Outcome: Progressing 04/07/2022 2251 by Gwyndolyn Kaufman, RN Outcome: Progressing   Problem: Tissue Perfusion: Goal: Adequacy of tissue perfusion will improve 04/07/2022 2251 by Gwyndolyn Kaufman, RN Outcome: Progressing 04/07/2022 2251 by Gwyndolyn Kaufman, RN Outcome: Progressing   Problem: Education: Goal: Knowledge of General Education information will improve Description: Including pain rating scale, medication(s)/side effects and non-pharmacologic comfort measures 04/07/2022 2251 by Gwyndolyn Kaufman, RN Outcome: Progressing 04/07/2022 2251 by Gwyndolyn Kaufman, RN Outcome: Progressing   Problem: Health Behavior/Discharge Planning: Goal: Ability to manage health-related needs will improve 04/07/2022 2251 by Gwyndolyn Kaufman, RN Outcome: Progressing 04/07/2022 2251 by Gwyndolyn Kaufman, RN Outcome: Progressing   Problem: Clinical Measurements: Goal: Ability to maintain clinical measurements within normal limits will improve 04/07/2022 2251 by Gwyndolyn Kaufman, RN Outcome: Progressing 04/07/2022 2251 by Gwyndolyn Kaufman, RN Outcome: Progressing Goal: Will remain free from infection 04/07/2022 2251 by Gwyndolyn Kaufman, RN Outcome: Progressing 04/07/2022 2251 by Gwyndolyn Kaufman, RN Outcome: Progressing Goal: Diagnostic test results will improve 04/07/2022 2251 by Gwyndolyn Kaufman, RN Outcome: Progressing 04/07/2022 2251 by Gwyndolyn Kaufman, RN Outcome: Progressing Goal: Respiratory complications will improve 04/07/2022 2251 by Gwyndolyn Kaufman, RN Outcome: Progressing 04/07/2022 2251 by Gwyndolyn Kaufman, RN Outcome: Progressing Goal: Cardiovascular complication will be avoided 04/07/2022 2251 by Gwyndolyn Kaufman, RN Outcome: Progressing 04/07/2022 2251 by Gwyndolyn Kaufman, RN Outcome: Progressing   Problem: Activity: Goal: Risk for activity intolerance will  decrease 04/07/2022 2251 by Gwyndolyn Kaufman, RN Outcome: Progressing 04/07/2022 2251 by Gwyndolyn Kaufman, RN Outcome: Progressing   Problem: Nutrition: Goal: Adequate nutrition will be maintained 04/07/2022 2251 by Gwyndolyn Kaufman, RN Outcome: Progressing 04/07/2022 2251 by Gwyndolyn Kaufman, RN Outcome: Progressing  Problem: Coping: Goal: Level of anxiety will decrease 04/07/2022 2251 by Gwyndolyn Kaufman, RN Outcome: Progressing 04/07/2022 2251 by Gwyndolyn Kaufman, RN Outcome: Progressing   Problem: Elimination: Goal: Will not experience complications related to bowel motility Outcome: Progressing Goal: Will not experience complications related to urinary retention Outcome: Progressing   Problem: Pain Managment: Goal: General experience of comfort will improve Outcome: Progressing   Problem: Safety: Goal: Ability to remain free from injury will improve Outcome: Progressing   Problem: Skin Integrity: Goal: Risk for impaired skin integrity will decrease Outcome: Progressing   Problem: Education: Goal: Expressions of having a comfortable level of knowledge regarding the disease process will increase Outcome: Progressing   Problem: Coping: Goal: Ability to adjust to condition or change in health will improve Outcome: Progressing Goal: Ability to identify appropriate support needs will improve Outcome: Progressing   Problem: Health Behavior/Discharge Planning: Goal: Compliance with prescribed medication regimen will improve Outcome: Progressing   Problem: Education: Goal: Knowledge of General Education information will improve Description: Including pain rating scale, medication(s)/side effects and non-pharmacologic comfort measures Outcome: Progressing   Problem: Health Behavior/Discharge Planning: Goal: Ability to manage health-related needs will improve Outcome: Progressing   Problem: Clinical Measurements: Goal: Ability to maintain clinical measurements  within normal limits will improve Outcome: Progressing Goal: Will remain free from infection Outcome: Progressing Goal: Diagnostic test results will improve Outcome: Progressing Goal: Respiratory complications will improve Outcome: Progressing Goal: Cardiovascular complication will be avoided Outcome: Progressing   Problem: Activity: Goal: Risk for activity intolerance will decrease Outcome: Progressing   Problem: Nutrition: Goal: Adequate nutrition will be maintained Outcome: Progressing   Problem: Coping: Goal: Level of anxiety will decrease Outcome: Progressing   Problem: Elimination: Goal: Will not experience complications related to bowel motility Outcome: Progressing Goal: Will not experience complications related to urinary retention Outcome: Progressing   Problem: Pain Managment: Goal: General experience of comfort will improve Outcome: Progressing   Problem: Safety: Goal: Ability to remain free from injury will improve Outcome: Progressing   Problem: Skin Integrity: Goal: Risk for impaired skin integrity will decrease Outcome: Progressing

## 2022-04-07 NOTE — Progress Notes (Signed)
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 s/p tumor resection 7/3. Recent DC from [facility] 6/22. PMH: CKD stage 3, HFpEF, a fib,  meningioma.    PT Comments    Pt was awake, responding to questions intermittently with short answers, and following single step commands majority of the time with increased time to process and respond. Pt maintaining a resting position of her head turned to her R and arm held in a flexed position. Focused session on improving her core strength and balance while performing AAROM of her neck and bil upper extremities and trying to activate her quads with LAQ AAROM while seated EOB. Pt required TA to come to a half stand from EOB and maxA for all bed mobility today. Noted moments where eyelids would flutter while pt appeared to be grinding her teeth while sitting unsupported EOB without LOB but responding to questions, notified RN. Will continue to follow acutely. Current recommendations remain appropriate.    Recommendations for follow up therapy are one component of a multi-disciplinary discharge planning process, led by the attending physician.  Recommendations may be updated based on patient status, additional functional criteria and insurance authorization.  Follow Up Recommendations  Skilled nursing-short term rehab (<3 hours/day) Can patient physically be transported by private vehicle: No   Assistance Recommended at Discharge Frequent or constant Supervision/Assistance  Patient can return home with the following Two people to help with walking and/or transfers;Two people to help with bathing/dressing/bathroom;Assistance with cooking/housework;Direct supervision/assist for medications management;Help with stairs or ramp for entrance;Direct supervision/assist for financial  management;Assist for transportation;Assistance with feeding   Equipment Recommendations  Wheelchair (measurements PT);Wheelchair cushion (measurements PT);Hospital bed;Other (comment) (lift equipment; pending progress)    Recommendations for Other Services       Precautions / Restrictions Precautions Precautions: Fall Precaution Comments: LUE edema; seizures Restrictions Weight Bearing Restrictions: No     Mobility  Bed Mobility Overal bed mobility: Needs Assistance Bed Mobility: Supine to Sit, Sit to Supine     Supine to sit: Max assist, HOB elevated Sit to supine: Max assist   General bed mobility comments: MaxA to bring bil legs towards EOB and ascend trunk to sit L EOB, pt initiating partially with her trunk. MaxA to cue pt to lean laterally onto elbow to descent trunk and lift legs back onto bed with return to supine. Pt positioned in chair-like position end of session.    Transfers Overall transfer level: Needs assistance Equipment used: 1 person hand held assist Transfers: Sit to/from Stand Sit to Stand: Total assist, From elevated surface           General transfer comment: TA to come to half stand on step stool from elevated EOB as pt has a short stature and cannot reach the floor from lowest bed height. Attempted multiple times, but little to no initiation through legs by pt.    Ambulation/Gait               General Gait Details: deferred, unable to stand   Stairs             Wheelchair Mobility    Modified Rankin (Stroke Patients Only) Modified Rankin (Stroke Patients Only) Pre-Morbid Rankin Score: Moderately severe disability Modified Rankin: Severe disability     Balance Overall balance assessment: Needs assistance Sitting-balance support: Single extremity supported, Bilateral upper extremity  supported, Feet supported, No upper extremity supported, Feet unsupported Sitting balance-Leahy Scale: Fair Sitting balance - Comments: Able  to sit statically with legs supported or unsupported with no-2 UE support with mild trunk sway, min guard for safety. Up to minA for dynamic sitting balance.   Standing balance support: Bilateral upper extremity supported, During functional activity Standing balance-Leahy Scale: Zero Standing balance comment: Unable to come to full stand with TA                            Cognition Arousal/Alertness: Awake/alert Behavior During Therapy: Flat affect Overall Cognitive Status: Impaired/Different from baseline Area of Impairment: Following commands, Attention, Problem solving, Safety/judgement                   Current Attention Level: Focused   Following Commands: Follows one step commands with increased time, Follows one step commands inconsistently Safety/Judgement: Decreased awareness of deficits, Decreased awareness of safety   Problem Solving: Slow processing, Decreased initiation, Difficulty sequencing, Requires verbal cues, Requires tactile cues General Comments: following 1 step commands majority of time with extra time to process and respond;  eyes opened throughout, maintaining a primarily R gaze but could cross midline to look at PT to her L momentarily intermittently; Poor initiation and needing cues for sequencing all tasks        Exercises General Exercises - Upper Extremity Shoulder Flexion: 5 reps, Both, AAROM, Seated Elbow Flexion: Both, AAROM, Seated, 10 reps Elbow Extension: Both, AAROM, Seated, 10 reps General Exercises - Lower Extremity Long Arc Quad: Both, Seated, AAROM, 5 reps Other Exercises Other Exercises: AAROM to neck into lateral flexion to L 2x and to rotation to L 5x    General Comments General comments (skin integrity, edema, etc.): VSS on 2L O2 via [place]      Pertinent Vitals/Pain Pain Assessment Pain Assessment: Faces Faces Pain Scale: Hurts little more Pain Location: generalized grimacing with mobility Pain Descriptors /  Indicators: Grimacing, Moaning Pain Intervention(s): Limited activity within patient's tolerance, Monitored during session, Repositioned    Home Living                          Prior Function            PT Goals (current goals can now be found in the care plan section) Acute Rehab PT Goals Patient Stated Goal: agreeable to participate PT Goal Formulation: With patient Time For Goal Achievement: 04/21/22 Potential to Achieve Goals: Fair Progress towards PT goals: Progressing toward goals    Frequency    Min 3X/week      PT Plan Current plan remains appropriate    Co-evaluation              AM-PAC PT "6 Clicks" Mobility   Outcome Measure  Help needed turning from your back to your side while in a flat bed without using bedrails?: Total Help needed moving from lying on your back to sitting on the side of a flat bed without using bedrails?: A Lot Help needed moving to and from a bed to a chair (including a wheelchair)?: Total Help needed standing up from a chair using your arms (e.g., wheelchair or bedside chair)?: Total Help needed to walk in hospital room?: Total Help needed climbing 3-5 steps with a railing? : Total 6 Click Score: 7    End of Session   Activity Tolerance: Patient limited by fatigue  Patient left: with call bell/phone within reach;with chair alarm set;in bed (with bed in chair-like position) Nurse Communication: Mobility status;Need for lift equipment PT Visit Diagnosis: Unsteadiness on feet (R26.81);Muscle weakness (generalized) (M62.81);Other symptoms and signs involving the nervous system (R29.898);Other abnormalities of gait and mobility (R26.89);Difficulty in walking, not elsewhere classified (R26.2)     Time: 1856-3149 PT Time Calculation (min) (ACUTE ONLY): 28 min  Charges:  $Therapeutic Exercise: 8-22 mins $Therapeutic Activity: 8-22 mins                     Moishe Spice, PT, DPT Acute Rehabilitation Services  Office:  [phone]    Orvan Falconer 04/07/2022, 9:37 AM

## 2022-04-07 NOTE — Progress Notes (Cosign Needed)
RE: Tara [name] Date of Birth: February 27, 2046 Date: 04/07/22  Please be advised that the above-named patient will require a short-term nursing home stay - anticipated 30 days or less for rehabilitation and strengthening.  The plan is for return home.

## 2022-04-08 ENCOUNTER — Inpatient Hospital Stay (HOSPITAL_COMMUNITY): Payer: Medicare Other

## 2022-04-08 DIAGNOSIS — R569 Unspecified convulsions: Secondary | ICD-10-CM | POA: Diagnosis not present

## 2022-04-08 DIAGNOSIS — I482 Chronic atrial fibrillation, unspecified: Secondary | ICD-10-CM | POA: Diagnosis not present

## 2022-04-08 DIAGNOSIS — K2100 Gastro-esophageal reflux disease with esophagitis, without bleeding: Secondary | ICD-10-CM | POA: Diagnosis not present

## 2022-04-08 DIAGNOSIS — F419 Anxiety disorder, unspecified: Secondary | ICD-10-CM | POA: Diagnosis not present

## 2022-04-08 DIAGNOSIS — R509 Fever, unspecified: Secondary | ICD-10-CM

## 2022-04-08 LAB — COMPREHENSIVE METABOLIC PANEL
ALT: 36 U/L (ref 0–44)
AST: 24 U/L (ref 15–41)
Albumin: 2.1 g/dL — ABNORMAL LOW (ref 3.5–5.0)
Alkaline Phosphatase: 74 U/L (ref 38–126)
Anion gap: 9 (ref 5–15)
BUN: 26 mg/dL — ABNORMAL HIGH (ref 8–23)
CO2: 24 mmol/L (ref 22–32)
Calcium: 8.1 mg/dL — ABNORMAL LOW (ref 8.9–10.3)
Chloride: 103 mmol/L (ref 98–111)
Creatinine, Ser: 0.83 mg/dL (ref 0.44–1.00)
GFR, Estimated: >60 mL/min (ref >60)
Glucose, Bld: 147 mg/dL — ABNORMAL HIGH (ref 70–99)
Potassium: 3.5 mmol/L (ref 3.5–5.1)
Sodium: 136 mmol/L (ref 135–145)
Total Bilirubin: 0.6 mg/dL (ref 0.3–1.2)
Total Protein: 5.5 g/dL — ABNORMAL LOW (ref 6.5–8.1)

## 2022-04-08 LAB — CBC WITH DIFFERENTIAL/PLATELET
Abs Immature Granulocytes: 0.06 10*3/uL (ref 0.00–0.07)
Basophils Absolute: 0 10*3/uL (ref 0.0–0.1)
Basophils Relative: 0 %
Eosinophils Absolute: 0 10*3/uL (ref 0.0–0.5)
Eosinophils Relative: 0 %
HCT: 22.6 % — ABNORMAL LOW (ref 36.0–46.0)
Hemoglobin: 7.4 g/dL — ABNORMAL LOW (ref 12.0–15.0)
Immature Granulocytes: 1 %
Lymphocytes Relative: 5 %
Lymphs Abs: 0.3 10*3/uL — ABNORMAL LOW (ref 0.7–4.0)
MCH: 28.7 pg (ref 26.0–34.0)
MCHC: 32.7 g/dL (ref 30.0–36.0)
MCV: 87.6 fL (ref 80.0–100.0)
Monocytes Absolute: 0.2 10*3/uL (ref 0.1–1.0)
Monocytes Relative: 2 %
Neutro Abs: 6.6 10*3/uL (ref 1.7–7.7)
Neutrophils Relative %: 92 %
Platelets: 85 10*3/uL — ABNORMAL LOW (ref 150–400)
RBC: 2.58 MIL/uL — ABNORMAL LOW (ref 3.87–5.11)
RDW: 17.2 % — ABNORMAL HIGH (ref 11.5–15.5)
WBC: 7.2 10*3/uL (ref 4.0–10.5)
nRBC: 0 % (ref 0.0–0.2)

## 2022-04-08 LAB — GLUCOSE, CAPILLARY
Glucose-Capillary: 100 mg/dL — ABNORMAL HIGH (ref 70–99)
Glucose-Capillary: 118 mg/dL — ABNORMAL HIGH (ref 70–99)
Glucose-Capillary: 121 mg/dL — ABNORMAL HIGH (ref 70–99)
Glucose-Capillary: 124 mg/dL — ABNORMAL HIGH (ref 70–99)
Glucose-Capillary: 148 mg/dL — ABNORMAL HIGH (ref 70–99)
Glucose-Capillary: 151 mg/dL — ABNORMAL HIGH (ref 70–99)

## 2022-04-08 LAB — URINALYSIS, ROUTINE W REFLEX MICROSCOPIC
Bacteria, UA: NONE SEEN
Bilirubin Urine: NEGATIVE
Glucose, UA: 150 mg/dL — AB
Hgb urine dipstick: NEGATIVE
Ketones, ur: NEGATIVE mg/dL
Leukocytes,Ua: NEGATIVE
Nitrite: NEGATIVE
Protein, ur: 100 mg/dL — AB
Specific Gravity, Urine: 1.017 (ref 1.005–1.030)
pH: 7 (ref 5.0–8.0)

## 2022-04-08 LAB — MAGNESIUM: Magnesium: 2.1 mg/dL (ref 1.7–2.4)

## 2022-04-08 LAB — PHOSPHORUS: Phosphorus: 2.5 mg/dL (ref 2.5–4.6)

## 2022-04-08 MED ORDER — FREE WATER
200.0000 mL | Freq: Four times a day (QID) | Status: DC
  Administered 2022-04-08 – 2022-04-18 (×33): 200 mL

## 2022-04-08 NOTE — Progress Notes (Signed)
OT Cancellation Note  Patient Details Name: Tara [name] MRN: [id number] DOB: [birth year]   Cancelled Treatment:    Reason Eval/Treat Not Completed: Medical issues which prohibited therapy (Unable to rouse pt despite max efforts, pt's BP was 86/47 with MAP of 59. RN notfied. OT to continue efforts as appropriate.)  [name] A A[place] Courts 04/08/2022, 5:35 PM

## 2022-04-08 NOTE — Progress Notes (Signed)
Patient ID: Tara [name], female   DOB: 04-07-1946, 76 y.o.   MRN: [id number] BP (!) 96/55 (BP Location: Left Wrist)   Pulse 86   Temp 98.2 F (36.8 C) (Axillary)   Resp 20   Ht '4\' 9"'$  (1.448 m)   Wt 81.5 kg   SpO2 96%   BMI 38.88 kg/m  Lethargic, opens eyes with vigorous stimulation. Not speaking, not following commands. Afebrile at this time White count is not impressive, 7.2.  Not clear why LOC remains suppressed. Currently fever could be reason, or postoperative course.  Will continue to monitor closely.

## 2022-04-08 NOTE — Progress Notes (Signed)
Nutrition Follow-up  DOCUMENTATION CODES:   Obesity unspecified  INTERVENTION:   Continue:  Tube feeding via Cortrak tube: Glucerna 1.5 at 40 ml/h (960 ml per day) Prosource TF 45 ml BID  Provides 1520 kcal, 101 gm protein, 820 ml free water daily  MVI with minerals daily  Add free water:  200 ml every 6 hours  Total free water: 1620 ml  NUTRITION DIAGNOSIS:   Inadequate oral intake related to inability to eat as evidenced by NPO status. Ongoing.   GOAL:   Patient will meet greater than or equal to 90% of their needs Met with TF at goal.   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   Pt discussed during ICU rounds and with RN.  Pt did not respond to any questions during visit. Per RN pt has not been very responsive today. SLP following for when pt becomes appropriate for POs.  Noted new stage 2 on buttocks documented  6/24 admit with new onset seizures 7/3 s/p bifrontal craniotomy for meningioma resection 7/6 less responsive, vomiting overnight with suspected aspiration 7/7 s/p cortrak placement; tip distal stomach/proximal duodenum  7/8 TF changed to Glucerna 1.5 per MD  Medications reviewed and include: SSI, semglee, keprra, MVI with minerals, protonix, miralax, senokot  Labs reviewed: BUN: 26 CBG's: 117-148  UOP: 500 ml   Diet Order:   Diet Order             Diet NPO time specified  Diet effective now                   EDUCATION NEEDS:   Not appropriate for education at this time  Skin:  Skin Assessment: Skin Integrity Issues: Skin Integrity Issues:: Stage II Stage II: buttocks  Last BM:  7/17  Height:   Ht Readings from Last 1 Encounters:  03/15/22 4' 9" (1.448 m)    Weight:   Wt Readings from Last 1 Encounters:  04/08/22 81.5 kg    BMI:   Body mass index is 38.88 kg/m.  Estimated Nutritional Needs:   Kcal:  1500-1700  Protein:  85-100 grams  Fluid:  >1.5 L/day  Lockie Pares., RD, LDN, CNSC See AMiON for contact information

## 2022-04-08 NOTE — TOC Progression Note (Signed)
Transition of Care [facility]) - Progression Note    Patient Details  Name: Tara [name] MRN: [id number] Date of Birth: 1946-04-28  Transition of Care [facility]) CM/SW [place], [place] Phone Number: 04/08/2022, 6:03 PM  Clinical Narrative:     Patient needs 30 day note signed of by attending in epic or sign form placed on shadow chart. Form is required  in order to get approval for short term rehab at [facility]    Thurmond Butts, MSW, LCSW Clinical Social Worker     Expected Discharge Plan: [place] Barriers to Discharge: Continued Medical Work up, Ship broker  Expected Discharge Plan and Services Expected Discharge Plan: [place]   Discharge Planning Services: CM Consult   Living arrangements for the past 2 months: [place]: [place]         Social Determinants of Health (SDOH) Interventions    Readmission Risk Interventions    03/06/2022   12:36 PM  Readmission Risk Prevention Plan  Transportation Screening Complete  HRI or Home Care Consult Complete  Social Work Consult for [place] Planning/Counseling Complete

## 2022-04-08 NOTE — Social Work (Cosign Needed Addendum)
                                                                                                                          RE: Tara [name] Date of Birth: 05/03/2046 Date: 04/07/22  To Whom It May Concern:  Please be advised that the above-named patient will require a short-term nursing home stay-anticipated 30 days or less for rehabilitation and strengthening. The plan is to return home.

## 2022-04-08 NOTE — Progress Notes (Signed)
PROGRESS NOTE    Tara [name]  IWL:[id number] DOB: 1945-12-12 DOA: 03/15/2022 PCP: [name], MD   Brief Narrative:  Patient is a 76 year old African-American female currently with past medical history significant for but not limited to anxiety, depression, diabetes mellitus type 2, hypertension, hyperlipidemia, cardiomyopathy, history of atrial fibrillation on anticoagulation with Eliquis, chronic kidney disease stage IIIb, history of breast cancer status post chemoradiation in 2008 as well as other comorbidities who presented to the hospital on 03/15/2022 with new onset seizures.  She had a known sphenoidal meningioma that was noted to be increased in size on the CT scan with surrounding edema so she is transferred from [facility] for further evaluation.  She underwent LTM with no further seizures and was evaluated by neurosurgery and underwent bifrontal craniotomy for meningioma resection on 03/24/2020 and was transferred to the intensive care unit postoperatively with PCCM consulting while she was in ICU.  Current clinical course and significant Hospital events include the following but not limited to:   Significant Hospital Events: Including procedures, antibiotic start and stop dates in addition to other pertinent events   6/24 presented to [facility] ED with new onset seizures, CTH with enlarging meningioma with edema, loaded with Keppra 7/3 Underwent bifrontal craniotomy for meningioma, monitor in ICU 7/5 stable, no further seizure activity 7/6 less responsive, two episodes of vomiting overnight and increased oral secretions today.  Continuous blinking on exam. CTH yesterday with new 7 mm thick subdural hematoma at the foramen magnum and tracking adjacent to the basion and odontoid not currently causing critical stenosis of the foramen magnum per radiology read, but merits surveillance. 7/7 Better night and more awake and responsive, repeat head CT unchanged and no seizures on video EEG 7/9  Recurrent seizure overnight requiring '4mg'$  of IV ativan to break. This am less responsive with forced right gaze  7/10 no further seizure activity, awake, responding, no forced gaze 7/11 recurrent sz like activity. Not correlated on EEG. CT unchanged.    **Interim History White count has improved and resolved and she was transferred to the [facility] consulting service on 04/07/2022.  Patient was transferred to the progressive care unit.  She did have a slight temperature yesterday on 04/07/2022 at 100.5 and today on 04/08/2022 worsened to 101.1 and will further work-up and pan-culture the patient.   Assessment and Plan:  Planum Sphenoidal meningioma, enlarging with new vasogenic edema and new onset seizures -History is consistent with tonic-clonic seizure with no previous history of seizures -Neurology was consulted and recommended admission to medicine, and was loaded with Keppra -Post-op day 7/3 s/p bifrontal craniotomy, EEG negative  -Has had several episodes of seizure-like activity post-op  -Ongoing absence seizure type activity as well as lip smacking and facial twitching. None of which has been proven sz on EEG.  -Neurosurgery is following and she is status post bifrontal craniotomy for meningioma resection -Continue Levetiracetam 1000 mg per Tube BID and Lacosamide 200 mg per Tube BID per neurology recommendations -Seizure precautions with PRN benzodiazepines -Midodrine was tapered off -Patient is 2 mg of IV lorazepam every 10 minutes as needed's procedure -Continue PT/OT evaluation and they are recommend SNF -Patient was lethargic today and not able to really participate with SLP and SLP recommending not feeding patient advancing her until she is responsive -Neurosurgery recommends continue to mobilize.   Leukocytosis, improved -WBC has trended down from 16.3 to 11.4 yesterday and today is improved to 8.0 -She is off steroids now -UA is negative as  well as x-ray chest was done yesterday  showing no acute changes  -If she continues to spike temperatures will need blood cultures x2, urinalysis and urine culture as well as a repeat chest x-ray in the a.m.  Fever in setting of questionable new pneumonia and possible aspiration -Had a T Max of 101.1 this AM -We will panculture and obtain blood cultures x2, urine culture as well as urinalysis and a chest x-ray -Chest x-ray done and showed "New hazy opacities projecting over both upper lobes in the suprahilar regions could reflect developing infection in the correct clinical setting. Consider repeat upright PA and lateral radiographs for better evaluation as indicated" -Blood cultures x2 pending -Repeat urinalysis done in not really remarkable for infection given the initial 150 glucose, negative ketones, negative leukocytes, negative nitrites, no bacteria seen in 3-5 squamous epithelial cells 0-5 WBCs -Continue monitor for signs and symptoms of infection and if she continues to spike temperatures will empirically start antibiotic coverage and repeat her chest x-ray in the a.m. we will consider CT scan   Thrombocytopenia -stable ABLA Acute on chronic thrombocytopenia -No evidence of active bleeding.  -Patient's platelet count is relatively stable now and at 92 yesterday and did drop to 85 and hemoglobin/hematocrit is stable at 7.9/24.8 yesterday but did drop to 7.4/22.6 -Continue monitor for signs and symptoms bleeding; no overt bleeding noted -Repeat CBC in a.m.   Metabolic Acidosis -Improved.  Patient's CO2 was 18, anion gap was 14, chloride level was 105; now CO2 24, chloride level is 103 and anion gap of 9 -Continue to monitor and trend and repeat CMP in a.m.   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 per tube daily, and Rosuvastatin 20 mg per tube daily -Continue with metoprolol tartrate 5 mg IV every 4 as needed for heart rate greater than 110 as well as labetalol 10 to 40 mg IV q. 10 minutes as needed for high blood pressure as well as IV hydralazine 10 mg every 4 as needed for systolic blood pressure greater than 180   Type 2 DM -Blood sugars are controlled -Continue moderate NovoLog sliding scale insulin every 4 with CBG goal 140-180 -CBGs ranging from 117-151   GERD/GI prophylaxis -Continue pantoprazole 40 mg per tube daily   Dysphagia -Continue tube feeds per nutritionist recommendations with Glucerna 1.5 at 1000 mL per tube at 40 MLS per hour every 24 hours as well as Prosource tube feeding liquid 45 MLS per tube twice daily Nutrition Status: Nutrition Problem: Inadequate oral intake Etiology: inability to eat Signs/Symptoms: NPO status Interventions: Tube feeding, MVI -SLP evaluated and recommending n.p.o.  Obesity -Complicates overall prognosis and care -Estimated body mass index is 38.88 kg/m as calculated from the following:   Height as of this encounter: '4\' 9"'$  (1.448 m).   Weight as of this encounter: 81.5 kg.  -Weight Loss and Dietary Counseling given  DVT prophylaxis: SCDs Start: 03/26/22 0840 Place TED hose Start: 03/24/22 0616 Place and maintain sequential compression device Start: 03/23/22 0631 apixaban (ELIQUIS) tablet 5 mg    Code Status: Full Code  Family Communication: Discussed with her sisters at bedside  Disposition Plan:  Level of care: Progressive Status is: Inpatient Remains inpatient appropriate because: Keep temperatures and will need further work-up.  Not responsive and will need SNF  once stable for discharge   Consultants:  PCCM Neurosurgery Neurology    Procedures:  See as above  Antimicrobials:  Anti-infectives (From admission, onward)    Start     Dose/Rate Route Frequency Ordered Stop   03/27/22 0945  Ampicillin-Sulbactam (UNASYN) 3 g in sodium chloride 0.9 % 100 mL IVPB        3 g 200 mL/hr over 30 Minutes Intravenous Every 6 hours 03/27/22 0859 04/01/22 0518   03/25/22 0600  ceFAZolin (ANCEF) IVPB 2g/100 mL premix        2 g 200 mL/hr over 30 Minutes Intravenous On call to O.R. 03/24/22 1215 03/24/22 1440       Subjective: Seen and examined at bedside and was extremely lethargic today and not as interactive.  She is alert earlier but not right now.  No nausea or vomiting.  Spiked a temperature of 101 unclear etiology.  Chest x-ray shows possible pneumonia so we will repeat chest x-ray in the a.m. and hold off antibiotics at this time and reinitiate if she continues to spike temperatures and consider adding IV Unasyn.  No other concerns or plaints this time  Objective: Vitals:   04/08/22 0900 04/08/22 1054 04/08/22 1100 04/08/22 1507  BP: 110/62 (!) 100/57 (!) 101/55 (!) 94/54  Pulse:  83  76  Resp:  16  15  Temp: 99 F (37.2 C) (!) 100.6 F (38.1 C) 99.6 F (37.6 C) 98.7 F (37.1 C)  TempSrc:  Oral  Oral  SpO2:  97%  91%  Weight:      Height:        Intake/Output Summary (Last 24 hours) at 04/08/2022 1731 Last data filed at 04/07/2022 2326 Gross per 24 hour  Intake --  Output 300 ml  Net -300 ml   Filed Weights   04/06/22 0459 04/07/22 0500 04/08/22 0500  Weight: 77.1 kg 82.6 kg 81.5 kg   Examination: Physical Exam:  Constitutional: WN/WD obese female currently no acute distress appears lethargic and not as interactive again; Cortrak is in place Respiratory: Diminished to auscultation bilaterally with coarse breath sounds and some slight rhonchi noted, no wheezing, rales, or crackles. Normal respiratory effort and patient is not  tachypenic. No accessory muscle use.  Unlabored breathing and not wearing any supplemental oxygen nasal cannula Cardiovascular: RRR, no murmurs / rubs / gallops. S1 and S2 auscultated.  1+ extremity edema noted Abdomen: Soft, non-tender, distended secondary body habitus. Bowel sounds positive.  GU: Deferred. Musculoskeletal: No clubbing / cyanosis of digits/nails. No joint deformity upper and lower extremities.   Neurologic: Lethargic and does not follow commands currently Psychiatric: Impaired judgment and insight.  She is not alert and oriented x 3.  Data Reviewed: I have personally reviewed following labs and imaging studies  CBC: Recent Labs  Lab 04/04/22 0640 04/05/22 0240 04/06/22 0322 04/07/22 0915 04/08/22 1008  WBC 19.1* 16.3* 11.4* 8.0 7.2  NEUTROABS  --   --   --  7.5 6.6  HGB 8.4* 7.9* 7.9* 7.9* 7.4*  HCT 25.8* 23.9* 24.6* 24.8* 22.6*  MCV 86.9 87.2 88.5 89.2 87.6  PLT 99* 87* 93* 92* 85*   Basic Metabolic Panel: Recent Labs  Lab 04/04/22 0502 04/05/22 0240 04/06/22 0322 04/07/22 0915 04/08/22 1008  NA 137 138  137 136 136  K 4.3 4.2 4.5 3.8 3.5  CL 101 106 105 103 103  CO2 21* 24 18* 25 24  GLUCOSE 110* 128* 102* 126* 147*  BUN 38* 38* 38* 32* 26*  CREATININE 0.95 1.00 0.91 0.88 0.83  CALCIUM 8.6* 8.5* 8.2* 8.1* 8.1*  MG 2.3 2.4  --  2.1 2.1  PHOS 3.6 4.0  --  3.4 2.5   GFR: Estimated Creatinine Clearance: 50.8 mL/min (by C-G formula based on SCr of 0.83 mg/dL). Liver Function Tests: Recent Labs  Lab 04/07/22 0915 04/08/22 1008  AST 24 24  ALT 36 36  ALKPHOS 87 74  BILITOT 0.5 0.6  PROT 5.6* 5.5*  ALBUMIN 2.3* 2.1*   No results for input(s): "LIPASE", "AMYLASE" in the last 168 hours. No results for input(s): "AMMONIA" in the last 168 hours. Coagulation Profile: No results for input(s): "INR", "PROTIME" in the last 168 hours. Cardiac Enzymes: No results for input(s): "CKTOTAL", "CKMB", "CKMBINDEX", "TROPONINI" in the last 168 hours. BNP (last  3 results) No results for input(s): "PROBNP" in the last 8760 hours. HbA1C: No results for input(s): "HGBA1C" in the last 72 hours. CBG: Recent Labs  Lab 04/07/22 2324 04/08/22 0337 04/08/22 0738 04/08/22 1058 04/08/22 1505  GLUCAP 117* 118* 121* 148* 151*   Lipid Profile: No results for input(s): "CHOL", "HDL", "LDLCALC", "TRIG", "CHOLHDL", "LDLDIRECT" in the last 72 hours. Thyroid Function Tests: No results for input(s): "TSH", "T4TOTAL", "FREET4", "T3FREE", "THYROIDAB" in the last 72 hours. Anemia Panel: No results for input(s): "VITAMINB12", "FOLATE", "FERRITIN", "TIBC", "IRON", "RETICCTPCT" in the last 72 hours. Sepsis Labs: No results for input(s): "PROCALCITON", "LATICACIDVEN" in the last 168 hours.  Recent Results (from the past 240 hour(s))  Culture, blood (Routine X 2) w Reflex to ID Panel     Status: None (Preliminary result)   Collection Time: 04/08/22 10:08 AM   Specimen: BLOOD  Result Value Ref Range Status   Specimen Description BLOOD LEFT ANTECUBITAL  Final   Special Requests   Final    BOTTLES DRAWN AEROBIC AND ANAEROBIC Blood Culture adequate volume   Culture   Final    NO GROWTH < 12 HOURS Performed at [facility], [address] [place], [place] [address]    Report Status PENDING  Incomplete  Culture, blood (Routine X 2) w Reflex to ID Panel     Status: None (Preliminary result)   Collection Time: 04/08/22 10:12 AM   Specimen: BLOOD LEFT FOREARM  Result Value Ref Range Status   Specimen Description BLOOD LEFT FOREARM  Final   Special Requests   Final    BOTTLES DRAWN AEROBIC AND ANAEROBIC Blood Culture adequate volume   Culture   Final    NO GROWTH < 12 HOURS Performed at [facility], [place] [address] [place], [place] [address]    Report Status PENDING  Incomplete     Radiology Studies: DG CHEST PORT 1 VIEW  Result Date: 04/08/2022 CLINICAL DATA:  Fever, history of seizures EXAM: PORTABLE CHEST 1 VIEW COMPARISON:  Chest radiograph  04/04/2022 FINDINGS: The enteric catheter courses below the diaphragm and off the field of view. The cardiomediastinal silhouette is stable, allowing for rightward patient rotation. Calcified mediastinal and hilar lymph nodes are again seen. There is new hazy opacity projecting over both upper lobes in the suprahilar regions. There is no significant pleural effusion. There is no appreciable pneumothorax The bones are stable. IMPRESSION: New hazy opacities projecting over both upper lobes in  the suprahilar regions could reflect developing infection in the correct clinical setting. Consider repeat upright PA and lateral radiographs for better evaluation as indicated. Electronically Signed   By: Valetta Mole M.D.   On: 04/08/2022 09:18     Scheduled Meds:  amiodarone  200 mg Per Tube Daily   apixaban  5 mg Per Tube BID   carvedilol  6.25 mg Per Tube BID WC   Chlorhexidine Gluconate Cloth  6 each Topical Daily   feeding supplement (GLUCERNA 1.5 CAL)  1,000 mL Per Tube Q24H   feeding supplement (PROSource TF)  45 mL Per Tube BID   free water  200 mL Per Tube Q6H   insulin aspart  0-15 Units Subcutaneous Q4H   insulin glargine-yfgn  10 Units Subcutaneous Daily   lacosamide  200 mg Per Tube BID   levETIRAcetam  1,000 mg Per Tube BID   montelukast  10 mg Per Tube QHS   multivitamin with minerals  1 tablet Per Tube Daily   mouth rinse  15 mL Mouth Rinse 4 times per day   pantoprazole sodium  40 mg Per Tube Daily   polyethylene glycol  17 g Per NG tube Daily   rosuvastatin  20 mg Per Tube Daily   senna  1 tablet Per Tube BID   sertraline  25 mg Per Tube Daily   Continuous Infusions:   LOS: 24 days   Raiford Noble, DO [facility] Available via Epic secure chat 7am-7pm After these hours, please refer to coverage provider listed on [email or web address] 04/08/2022, 5:31 PM

## 2022-04-08 NOTE — Progress Notes (Signed)
Speech Language Pathology Treatment: Dysphagia  Patient Details Name: Tara [name] MRN: [id number] DOB: 01/22/1946 Today's Date: 04/08/2022 Time: 1300-1310 SLP Time Calculation (min) (ACUTE ONLY): 10 min  Assessment / Plan / Recommendation Clinical Impression  With repositioning, verbal and tactile stimuli and eventually sternal rub pt did not verbalize, follow commands or open eyes. When a piece of ice was offered pt did open mouth and masticate ice, but pt seemed to lose attention and eventually coughed. Pt still is not responsive or alert enough for PO intake.    HPI HPI: Pt is a 76 y/o who presented 6/24 with new onset seizures. 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 EEG negative for seizures. Neurosurgey consulted and did not recommend meningioma resection as of 6/27. Decline in swallow function noted overnight 6/27. Dx acute toxic metabolic encephalopathy suspected secondary to keppra. Pt s/p Bifrontal craniotomy for resection of meningioma 7/3. PMH: HTN, HLD, GERD, CKD stage IIIb, chronic HFpEF, meningioma, breast CA-s/p right lumpectomy and chemoradiation in 2008, depression/anxiety. BSE 03/11/22: regular texture diet and thin liquids without need for follow up.      SLP Plan         Recommendations for follow up therapy are one component of a multi-disciplinary discharge planning process, led by the attending physician.  Recommendations may be updated based on patient status, additional functional criteria and insurance authorization.    Recommendations  Diet recommendations: NPO                Oral Care Recommendations: Oral care QID           Camia Dipinto, [name]  04/08/2022, 3:29 PM

## 2022-04-09 ENCOUNTER — Inpatient Hospital Stay (HOSPITAL_COMMUNITY): Payer: Medicare Other

## 2022-04-09 DIAGNOSIS — D320 Benign neoplasm of cerebral meninges: Secondary | ICD-10-CM | POA: Diagnosis not present

## 2022-04-09 DIAGNOSIS — D696 Thrombocytopenia, unspecified: Secondary | ICD-10-CM | POA: Diagnosis not present

## 2022-04-09 DIAGNOSIS — I482 Chronic atrial fibrillation, unspecified: Secondary | ICD-10-CM | POA: Diagnosis not present

## 2022-04-09 DIAGNOSIS — R569 Unspecified convulsions: Secondary | ICD-10-CM | POA: Diagnosis not present

## 2022-04-09 LAB — GLUCOSE, CAPILLARY
Glucose-Capillary: 114 mg/dL — ABNORMAL HIGH (ref 70–99)
Glucose-Capillary: 132 mg/dL — ABNORMAL HIGH (ref 70–99)
Glucose-Capillary: 149 mg/dL — ABNORMAL HIGH (ref 70–99)
Glucose-Capillary: 137 mg/dL — ABNORMAL HIGH (ref 70–99)
Glucose-Capillary: 120 mg/dL — ABNORMAL HIGH (ref 70–99)

## 2022-04-09 LAB — CBC WITH DIFFERENTIAL/PLATELET
Abs Immature Granulocytes: 0.02 10*3/uL (ref 0.00–0.07)
Basophils Absolute: 0 10*3/uL (ref 0.0–0.1)
Basophils Relative: 0 %
Eosinophils Absolute: 0 10*3/uL (ref 0.0–0.5)
Eosinophils Relative: 0 %
HCT: 23.2 % — ABNORMAL LOW (ref 36.0–46.0)
Hemoglobin: 7.5 g/dL — ABNORMAL LOW (ref 12.0–15.0)
Immature Granulocytes: 0 %
Lymphocytes Relative: 6 %
Lymphs Abs: 0.4 10*3/uL — ABNORMAL LOW (ref 0.7–4.0)
MCH: 28.8 pg (ref 26.0–34.0)
MCHC: 32.3 g/dL (ref 30.0–36.0)
MCV: 89.2 fL (ref 80.0–100.0)
Monocytes Absolute: 0.1 10*3/uL (ref 0.1–1.0)
Monocytes Relative: 2 %
Neutro Abs: 5.7 10*3/uL (ref 1.7–7.7)
Neutrophils Relative %: 92 %
Platelets: 85 10*3/uL — ABNORMAL LOW (ref 150–400)
RBC: 2.6 MIL/uL — ABNORMAL LOW (ref 3.87–5.11)
RDW: 17.1 % — ABNORMAL HIGH (ref 11.5–15.5)
WBC: 6.2 10*3/uL (ref 4.0–10.5)
nRBC: 0 % (ref 0.0–0.2)

## 2022-04-09 LAB — URINE CULTURE

## 2022-04-09 LAB — COMPREHENSIVE METABOLIC PANEL
ALT: 36 U/L (ref 0–44)
AST: 26 U/L (ref 15–41)
Albumin: 2 g/dL — ABNORMAL LOW (ref 3.5–5.0)
Alkaline Phosphatase: 68 U/L (ref 38–126)
Anion gap: 13 (ref 5–15)
BUN: 26 mg/dL — ABNORMAL HIGH (ref 8–23)
CO2: 21 mmol/L — ABNORMAL LOW (ref 22–32)
Calcium: 8.1 mg/dL — ABNORMAL LOW (ref 8.9–10.3)
Chloride: 101 mmol/L (ref 98–111)
Creatinine, Ser: 0.75 mg/dL (ref 0.44–1.00)
GFR, Estimated: >60 mL/min (ref >60)
Glucose, Bld: 115 mg/dL — ABNORMAL HIGH (ref 70–99)
Potassium: 4 mmol/L (ref 3.5–5.1)
Sodium: 135 mmol/L (ref 135–145)
Total Bilirubin: 0.5 mg/dL (ref 0.3–1.2)
Total Protein: 5.3 g/dL — ABNORMAL LOW (ref 6.5–8.1)

## 2022-04-09 LAB — PHOSPHORUS: Phosphorus: 2.8 mg/dL (ref 2.5–4.6)

## 2022-04-09 LAB — MAGNESIUM: Magnesium: 2 mg/dL (ref 1.7–2.4)

## 2022-04-09 MED ORDER — VANCOMYCIN HCL 1750 MG/350ML IV SOLN
1750.0000 mg | Freq: Once | INTRAVENOUS | Status: AC
  Administered 2022-04-09: 1750 mg via INTRAVENOUS
  Filled 2022-04-09: qty 350

## 2022-04-09 MED ORDER — VANCOMYCIN HCL 750 MG/150ML IV SOLN
750.0000 mg | INTRAVENOUS | Status: DC
  Administered 2022-04-10: 750 mg via INTRAVENOUS
  Filled 2022-04-09 (×2): qty 150

## 2022-04-09 MED ORDER — SODIUM CHLORIDE 0.9 % IV SOLN
2.0000 g | Freq: Two times a day (BID) | INTRAVENOUS | Status: DC
  Administered 2022-04-09 – 2022-04-11 (×4): 2 g via INTRAVENOUS
  Filled 2022-04-09 (×4): qty 12.5

## 2022-04-09 MED ORDER — CEFEPIME HCL 2 G IV SOLR
2.0000 g | Freq: Once | INTRAVENOUS | Status: AC
  Administered 2022-04-09: 2 g via INTRAVENOUS
  Filled 2022-04-09: qty 12.5

## 2022-04-09 NOTE — Progress Notes (Signed)
Physical Therapy Treatment Patient Details Name: Tara [name] MRN: [id number] DOB: 01-16-1946 Today's Date: 04/09/2022   History of Present Illness 76 yo woman admitted 03/15/22 with new onset seizures related to meningioma. s/p tumor resection 7/3. Recent DC from [facility] 6/22. PMH: CKD stage 3, HFpEF, a fib,  meningioma.    PT Comments    Patient continues to be lethargic. Eyes opened while sitting EOB but with L upward gaze preference and inability to track to midline. Requires totalA+2 for bed mobility with no initiation noted. Patient not following commands this session. Progress remains limited by current deficits and unsure if patient will make substantial progress to improve quality of life. Recommend palliative consult to discuss goals of care. Continue to recommend SNF for ongoing Physical Therapy.      Recommendations for follow up therapy are one component of a multi-disciplinary discharge planning process, led by the attending physician.  Recommendations may be updated based on patient status, additional functional criteria and insurance authorization.  Follow Up Recommendations  Skilled nursing-short term rehab (<3 hours/day) Can patient physically be transported by private vehicle: No   Assistance Recommended at Discharge Frequent or constant Supervision/Assistance  Patient can return home with the following Two people to help with walking and/or transfers;Two people to help with bathing/dressing/bathroom;Assistance with cooking/housework;Direct supervision/assist for medications management;Help with stairs or ramp for entrance;Direct supervision/assist for financial management;Assist for transportation;Assistance with feeding   Equipment Recommendations  Wheelchair (measurements PT);Wheelchair cushion (measurements PT);Hospital bed;Other (comment) (hoyer lift)    Recommendations for Other Services       Precautions / Restrictions Precautions Precautions: Fall Precaution  Comments: LUE edema; seizures Restrictions Weight Bearing Restrictions: No     Mobility  Bed Mobility Overal bed mobility: Needs Assistance Bed Mobility: Supine to Sit, Sit to Supine     Supine to sit: Total assist, +2 for physical assistance, +2 for safety/equipment Sit to supine: Total assist, +2 for physical assistance, +2 for safety/equipment   General bed mobility comments: totalA+2 for all aspects of bed mobility    Transfers                   General transfer comment: deferred due to poor sitting balance and level of arousal. Not safe to attempt    Ambulation/Gait                   Stairs             Wheelchair Mobility    Modified Rankin (Stroke Patients Only)       Balance Overall balance assessment: Needs assistance Sitting-balance support: Single extremity supported, Feet unsupported Sitting balance-Leahy Scale: Poor Sitting balance - Comments: required posterior support in sitting with no attempt to catch self with sudden loss of posterior support. One instance of static sitting with min guard but <10 seconds and patient with L posterolateral lean. Postural control: Posterior lean, Left lateral lean                                  Cognition Arousal/Alertness: Lethargic Behavior During Therapy: Flat affect Overall Cognitive Status: Difficult to assess                                 General Comments: Not following commands. Eyes opened in sitting with minimal tracking to L but does not achieve midline or  cross midline to R. Mostly upward and to the L gaze during session.        Exercises      General Comments        Pertinent Vitals/Pain Pain Assessment Pain Assessment: Faces Faces Pain Scale: No hurt Pain Intervention(s): Monitored during session    Home Living                          Prior Function            PT Goals (current goals can now be found in the care plan  section) Acute Rehab PT Goals PT Goal Formulation: Patient unable to participate in goal setting Time For Goal Achievement: 04/21/22 Potential to Achieve Goals: Fair Progress towards PT goals: Not progressing toward goals - comment    Frequency    Min 3X/week      PT Plan Current plan remains appropriate    Co-evaluation PT/OT/SLP Co-Evaluation/Treatment: Yes Reason for Co-Treatment: For patient/therapist safety;To address functional/ADL transfers PT goals addressed during session: Mobility/safety with mobility        AM-PAC PT "6 Clicks" Mobility   Outcome Measure  Help needed turning from your back to your side while in a flat bed without using bedrails?: Total Help needed moving from lying on your back to sitting on the side of a flat bed without using bedrails?: Total Help needed moving to and from a bed to a chair (including a wheelchair)?: Total Help needed standing up from a chair using your arms (e.g., wheelchair or bedside chair)?: Total Help needed to walk in hospital room?: Total Help needed climbing 3-5 steps with a railing? : Total 6 Click Score: 6    End of Session Equipment Utilized During Treatment: Oxygen Activity Tolerance: Patient limited by lethargy Patient left: in bed;with call bell/phone within reach;with bed alarm set Nurse Communication: Mobility status;Need for lift equipment PT Visit Diagnosis: Unsteadiness on feet (R26.81);Muscle weakness (generalized) (M62.81);Other symptoms and signs involving the nervous system (R29.898);Other abnormalities of gait and mobility (R26.89);Difficulty in walking, not elsewhere classified (R26.2)     Time: 9357-0177 PT Time Calculation (min) (ACUTE ONLY): 26 min  Charges:  $Therapeutic Activity: 8-22 mins                     Sharman Garrott A. Gilford Rile PT, DPT Acute Rehabilitation Services Office [phone]    Linna Hoff 04/09/2022, 1:56 PM

## 2022-04-09 NOTE — Progress Notes (Signed)
PROGRESS NOTE    Tara [name]  ERX:[id number] DOB: Jan 24, 1946 DOA: 03/15/2022 PCP: [name], MD   Brief Narrative:  Patient is a 76 year old African-American female currently with past medical history significant for but not limited to anxiety, depression, diabetes mellitus type 2, hypertension, hyperlipidemia, cardiomyopathy, history of atrial fibrillation on anticoagulation with Eliquis, chronic kidney disease stage IIIb, history of breast cancer status post chemoradiation in 2008 as well as other comorbidities who presented to the hospital on 03/15/2022 with new onset seizures.  She had a known sphenoidal meningioma that was noted to be increased in size on the CT scan with surrounding edema so she is transferred from [facility] for further evaluation.  She underwent LTM with no further seizures and was evaluated by neurosurgery and underwent bifrontal craniotomy for meningioma resection on 03/24/2020 and was transferred to the intensive care unit postoperatively with PCCM consulting while she was in ICU.  Current clinical course and significant Hospital events include the following but not limited to:   Significant Hospital Events: Including procedures, antibiotic start and stop dates in addition to other pertinent events   6/24 presented to [facility] ED with new onset seizures, CTH with enlarging meningioma with edema, loaded with Keppra 7/3 Underwent bifrontal craniotomy for meningioma, monitor in ICU 7/5 stable, no further seizure activity 7/6 less responsive, two episodes of vomiting overnight and increased oral secretions today.  Continuous blinking on exam. CTH yesterday with new 7 mm thick subdural hematoma at the foramen magnum and tracking adjacent to the basion and odontoid not currently causing critical stenosis of the foramen magnum per radiology read, but merits surveillance. 7/7 Better night and more awake and responsive, repeat head CT unchanged and no seizures on video EEG 7/9  Recurrent seizure overnight requiring '4mg'$  of IV ativan to break. This am less responsive with forced right gaze  7/10 no further seizure activity, awake, responding, no forced gaze 7/11 recurrent sz like activity. Not correlated on EEG. CT unchanged.     Assessment and Plan:  Planum Sphenoidal meningioma, enlarging with new vasogenic edema and new onset seizures History is consistent with tonic-clonic seizure with no previous history of seizures Neurosurgery is following and she is status post bifrontal craniotomy for meningioma resection Continue Levetiracetam 1000 mg per Tube BID and Lacosamide 200 mg per Tube BID per neurology recommendations Seizure precautions with PRN benzodiazepines Repeat CT head unremarkable Patient is 2 mg of IV lorazepam every 10 minutes as needed Continue PT/OT evaluation and they are recommend SNF  Fever in setting of questionable new pneumonia and possible aspiration Had a T Max of 101.1 this AM, currently with no leukocytosis BC x2 NGTD UA unremarkable for infection, UC showing multiple species Chest x-ray done and showed "New hazy opacities projecting over both upper lobes in the suprahilar regions could reflect developing infection in the correct clinical setting. 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 per tube daily, and Rosuvastatin 20 mg per tube daily   Type 2 DM Continue SSI, Accu-Cheks, hypoglycemic protocol   GERD/GI prophylaxis Continue pantoprazole 40 mg per tube daily   Dysphagia Continue tube feeds per nutritionist recommendations with Glucerna 1.5 at 1000 mL per tube at 40 MLS per hour every 24 hours as well as Prosource tube feeding liquid 45 MLS per tube twice daily Nutrition Status: Nutrition Problem: Inadequate oral intake Etiology: inability to eat Signs/Symptoms: NPO status Interventions: Tube feeding, MVI SLP evaluated and recommending n.p.o.  Obesity Lifestyle modification advised -Estimated body mass index is 38.88 kg/m as calculated from the following:   Height as of this encounter: '4\' 9"'$  (1.448 m).   Weight as of this encounter: 81.5 kg.    DVT prophylaxis: SCDs Start: 03/26/22 0840 Place TED hose Start: 03/24/22 0616 Place and maintain sequential compression device Start: 03/23/22 0631 apixaban (ELIQUIS) tablet 5 mg    Code Status: Full Code Family Communication: Discussed with her son at bedside  Disposition Plan:  Level of care: Progressive Status is: Inpatient Remains inpatient appropriate because: Not responsive and will need SNF once stable for discharge   Consultants:  PCCM Neurosurgery Neurology    Procedures:  See as above  Antimicrobials:  Anti-infectives (From admission, onward)    Start     Dose/Rate Route Frequency Ordered Stop   04/10/22 1200  vancomycin (VANCOREADY) IVPB 750 mg/150 mL        750 mg 150 mL/hr over 60 Minutes Intravenous Every 24 hours 04/09/22 1034     04/09/22 2300  ceFEPIme (MAXIPIME) 2 g in sodium chloride 0.9 % 100 mL IVPB        2 g 200 mL/hr over 30 Minutes Intravenous Every 12 hours 04/09/22 1035     04/09/22 1045  vancomycin (VANCOREADY) IVPB 1750 mg/350 mL        1,750 mg 175 mL/hr over 120 Minutes Intravenous   Once 04/09/22 0953 04/09/22 1344   04/09/22 1045  ceFEPIme (MAXIPIME) 2 g in sodium chloride 0.9 % 100 mL IVPB        2 g 200 mL/hr over 30 Minutes Intravenous  Once 04/09/22 0953 04/09/22 1524   03/27/22 0945  Ampicillin-Sulbactam (UNASYN) 3 g in sodium chloride 0.9 % 100 mL IVPB        3 g 200 mL/hr over 30 Minutes Intravenous Every 6 hours 03/27/22 0859 04/01/22 0518   03/25/22 0600  ceFAZolin (ANCEF) IVPB 2g/100 mL premix        2 g 200 mL/hr over 30 Minutes Intravenous On call to O.R. 03/24/22 1215 03/24/22 1440       Subjective: Vernard Gambles seen and examined at bedside.  Basically minimally responsive, unable to engage in meaningful conversation.  Still spiking temp  Objective: Vitals:   04/09/22 1700 04/09/22 1800 04/09/22 1900 04/09/22 1938  BP:   (!) 93/53 (P) 98/69  Pulse: 85 85 86 (P) 88  Resp: (!) 21 (!) 21 20 (!) (P) 22  Temp:   (!) 101.3 F (38.5 C) (!) (P) 101 F (38.3 C)  TempSrc:   Oral (P) Oral  SpO2: 95% 98% 97%   Weight:      Height:        Intake/Output Summary (Last 24 hours) at 04/09/2022 2015 Last data filed at 04/09/2022 1659 Gross per 24 hour  Intake 1310 ml  Output 1350 ml  Net -40 ml  Filed Weights   04/06/22 0459 04/07/22 0500 04/08/22 0500  Weight: 77.1 kg 82.6 kg 81.5 kg   Examination: Physical Exam: General: NAD, minimally responsive, noted large scar across forehead Cardiovascular: S1, S2 present Respiratory: CTAB Abdomen: Soft, nontender, nondistended, bowel sounds present Musculoskeletal: No bilateral pedal edema noted Skin: Normal Psychiatry: Unable to assess Neurology: Unable to follow commands   Data Reviewed: I have personally reviewed following labs and imaging studies  CBC: Recent Labs  Lab 04/05/22 0240 04/06/22 0322 04/07/22 0915 04/08/22 1008 04/09/22 0221  WBC 16.3* 11.4* 8.0 7.2 6.2  NEUTROABS  --   --  7.5 6.6 5.7  HGB 7.9* 7.9* 7.9* 7.4* 7.5*  HCT 23.9* 24.6* 24.8* 22.6* 23.2*  MCV 87.2 88.5 89.2 87.6 89.2   PLT 87* 93* 92* 85* 85*   Basic Metabolic Panel: Recent Labs  Lab 04/04/22 0502 04/05/22 0240 04/06/22 0322 04/07/22 0915 04/08/22 1008 04/09/22 0221  NA 137 138 137 136 136 135  K 4.3 4.2 4.5 3.8 3.5 4.0  CL 101 106 105 103 103 101  CO2 21* 24 18* 25 24 21*  GLUCOSE 110* 128* 102* 126* 147* 115*  BUN 38* 38* 38* 32* 26* 26*  CREATININE 0.95 1.00 0.91 0.88 0.83 0.75  CALCIUM 8.6* 8.5* 8.2* 8.1* 8.1* 8.1*  MG 2.3 2.4  --  2.1 2.1 2.0  PHOS 3.6 4.0  --  3.4 2.5 2.8   GFR: Estimated Creatinine Clearance: 52.7 mL/min (by C-G formula based on SCr of 0.75 mg/dL). Liver Function Tests: Recent Labs  Lab 04/07/22 0915 04/08/22 1008 04/09/22 0221  AST '24 24 26  '$ ALT 36 36 36  ALKPHOS 87 74 68  BILITOT 0.5 0.6 0.5  PROT 5.6* 5.5* 5.3*  ALBUMIN 2.3* 2.1* 2.0*   No results for input(s): "LIPASE", "AMYLASE" in the last 168 hours. No results for input(s): "AMMONIA" in the last 168 hours. Coagulation Profile: No results for input(s): "INR", "PROTIME" in the last 168 hours. Cardiac Enzymes: No results for input(s): "CKTOTAL", "CKMB", "CKMBINDEX", "TROPONINI" in the last 168 hours. BNP (last 3 results) No results for input(s): "PROBNP" in the last 8760 hours. HbA1C: No results for input(s): "HGBA1C" in the last 72 hours. CBG: Recent Labs  Lab 04/09/22 0350 04/09/22 0812 04/09/22 1137 04/09/22 1505 04/09/22 1927  GLUCAP 114* 132* 149* 137* 120*   Lipid Profile: No results for input(s): "CHOL", "HDL", "LDLCALC", "TRIG", "CHOLHDL", "LDLDIRECT" in the last 72 hours. Thyroid Function Tests: No results for input(s): "TSH", "T4TOTAL", "FREET4", "T3FREE", "THYROIDAB" in the last 72 hours. Anemia Panel: No results for input(s): "VITAMINB12", "FOLATE", "FERRITIN", "TIBC", "IRON", "RETICCTPCT" in the last 72 hours. Sepsis Labs: No results for input(s): "PROCALCITON", "LATICACIDVEN" in the last 168 hours.  Recent Results (from the past 240 hour(s))  Urine Culture     Status:  Abnormal   Collection Time: 04/08/22  8:40 AM   Specimen: Urine, Clean Catch  Result Value Ref Range Status   Specimen Description URINE, CLEAN CATCH  Final   Special Requests   Final    NONE Performed at [facility], [address], [place], [place] [address]    Culture MULTIPLE SPECIES PRESENT, SUGGEST RECOLLECTION (A)  Final   Report Status 04/09/2022 FINAL  Final  Culture, blood (Routine X 2) w Reflex to ID Panel     Status: None (Preliminary result)   Collection Time: 04/08/22 10:08 AM   Specimen: BLOOD  Result Value Ref Range Status   Specimen Description BLOOD LEFT  ANTECUBITAL  Final   Special Requests   Final    BOTTLES DRAWN AEROBIC AND ANAEROBIC Blood Culture adequate volume   Culture   Final    NO GROWTH < 24 HOURS Performed at [facility], [address] [place], [place] [address]    Report Status PENDING  Incomplete  Culture, blood (Routine X 2) w Reflex to ID Panel     Status: None (Preliminary result)   Collection Time: 04/08/22 10:12 AM   Specimen: BLOOD LEFT FOREARM  Result Value Ref Range Status   Specimen Description BLOOD LEFT FOREARM  Final   Special Requests   Final    BOTTLES DRAWN AEROBIC AND ANAEROBIC Blood Culture adequate volume   Culture   Final    NO GROWTH < 24 HOURS Performed at [facility], [place] [address], [place], [place] [address]    Report Status PENDING  Incomplete     Radiology Studies: CT HEAD WO CONTRAST (5MM)  Result Date: 04/09/2022 CLINICAL DATA:  Mental status change EXAM: CT HEAD WITHOUT CONTRAST TECHNIQUE: Contiguous axial images were obtained from the base of the skull through the vertex without intravenous contrast. RADIATION DOSE REDUCTION: This exam was performed according to the departmental dose-optimization program which includes automated exposure control, adjustment of the mA and/or kV according to patient size and/or use of iterative reconstruction technique. COMPARISON:  04/01/2022 FINDINGS: Brain:  No worrisome or unexpected change since the study of 8 days ago. Previous bifrontal craniotomy for resection of planum sphenoidale meningioma. Postoperative extra-axial fluid and air is slightly diminished. No worsening or progressive finding. Some blood products at the site of the resected tumor recumbent less dense. Small amount of subarachnoid blood is becoming less dense. No sign of postoperative infarction or hydrocephalus. Vascular: There is atherosclerotic calcification of the major vessels at the base of the brain. Skull: Otherwise negative. Sinuses/Orbits: Clear/normal Other: None IMPRESSION: Expected evolutionary postoperative findings. Diminishing extra-axial fluid and air in the frontal regions subsequent to previous frontal craniotomy for resection of planum sphenoidale meningioma. No new bleeding, mass effect or hydrocephalus. Electronically Signed   By: Nelson Chimes M.D.   On: 04/09/2022 11:41   DG Chest Port 1 View  Result Date: 04/09/2022 CLINICAL DATA:  Shortness of breath, cough EXAM: PORTABLE CHEST 1 VIEW COMPARISON:  04/08/2022 FINDINGS: Cardiomegaly. Multiple calcified right hilar lymph nodes and/or right lung nodules. Unchanged heterogeneous and interstitial airspace opacity, most concentrated in the right upper lobe although also seen in the left upper lobe. The visualized skeletal structures are unremarkable. IMPRESSION: 1. Cardiomegaly. 2. Unchanged heterogeneous and interstitial airspace opacity. Electronically Signed   By: Delanna Ahmadi M.D.   On: 04/09/2022 08:29   DG CHEST PORT 1 VIEW  Result Date: 04/08/2022 CLINICAL DATA:  Fever, history of seizures EXAM: PORTABLE CHEST 1 VIEW COMPARISON:  Chest radiograph 04/04/2022 FINDINGS: The enteric catheter courses below the diaphragm and off the field of view. The cardiomediastinal silhouette is stable, allowing for rightward patient rotation. Calcified mediastinal and hilar lymph nodes are again seen. There is new hazy opacity  projecting over both upper lobes in the suprahilar regions. There is no significant pleural effusion. There is no appreciable pneumothorax The bones are stable. IMPRESSION: New hazy opacities projecting over both upper lobes in the suprahilar regions could reflect developing infection in the correct clinical setting. Consider repeat upright PA and lateral radiographs for better evaluation as indicated. Electronically Signed   By: Valetta Mole M.D.   On: 04/08/2022  09:18     Scheduled Meds:  amiodarone  200 mg Per Tube Daily   apixaban  5 mg Per Tube BID   carvedilol  6.25 mg Per Tube BID WC   Chlorhexidine Gluconate Cloth  6 each Topical Daily   feeding supplement (GLUCERNA 1.5 CAL)  1,000 mL Per Tube Q24H   feeding supplement (PROSource TF)  45 mL Per Tube BID   free water  200 mL Per Tube Q6H   insulin aspart  0-15 Units Subcutaneous Q4H   insulin glargine-yfgn  10 Units Subcutaneous Daily   lacosamide  200 mg Per Tube BID   levETIRAcetam  1,000 mg Per Tube BID   montelukast  10 mg Per Tube QHS   multivitamin with minerals  1 tablet Per Tube Daily   mouth rinse  15 mL Mouth Rinse 4 times per day   pantoprazole sodium  40 mg Per Tube Daily   polyethylene glycol  17 g Per NG tube Daily   rosuvastatin  20 mg Per Tube Daily   senna  1 tablet Per Tube BID   sertraline  25 mg Per Tube Daily   Continuous Infusions:  ceFEPime (MAXIPIME) IV     [START ON 04/10/2022] vancomycin       LOS: 25 days   Alma Friendly, MD [facility] Available via Epic secure chat 7am-7pm After these hours, please refer to coverage provider listed on [email or web address] 04/09/2022, 8:15 PM

## 2022-04-09 NOTE — Progress Notes (Signed)
Pharmacy Antibiotic Note  Tara [name] is a 76 y.o. female admitted on 03/15/2022 with new-onset fever.  Patient completed a course of Unasyn for aspiration PNA.  Now with fever and hypotension, so Pharmacy has been consulted for vancomycin and cefepime dosing for sepsis.  CXR showed interstitial airspace opacity.   Renal function stable, Tmax 101.1, WBC WNL.  Plan: Vanc '1750mg'$  IV x 1, then '750mg'$  IV Q24H for AUC 472 using SCr 0.8 Cefepime 2gm IV Q12H Monitor renal fxn, micro data, clinical progress, vanc levels as indicated  Height: '4\' 9"'$  (144.8 cm) Weight: 81.5 kg (179 lb 10.8 oz) IBW/kg (Calculated) : 38.6  Temp (24hrs), Avg:99.4 F (37.4 C), Min:98.2 F (36.8 C), Max:101.1 F (38.4 C)  Recent Labs  Lab 04/05/22 0240 04/06/22 0322 04/07/22 0915 04/08/22 1008 04/09/22 0221  WBC 16.3* 11.4* 8.0 7.2 6.2  CREATININE 1.00 0.91 0.88 0.83 0.75    Estimated Creatinine Clearance: 52.7 mL/min (by C-G formula based on SCr of 0.75 mg/dL).    Allergies  Allergen Reactions   Aspirin Other (See Comments)    Reports GI bleed--pt is currently taking   Lisinopril Cough   Sudafed [Pseudoephedrine Hcl]     Pt reports she had drainage in throat that made her throat hurt.     Unasyn 7/6 >> 7/11 Vanc 7/19 >> Cefepime 7/19 >>   7/18 UCx -  7/18 BCx - NGTD  Tara [name] D. Mina Marble, PharmD, BCPS, [place] 04/09/2022, 10:34 AM

## 2022-04-09 NOTE — Progress Notes (Signed)
Patient ID: Tara [name], female   DOB: July 22, 1946, 76 y.o.   MRN: [id number] BP 111/78   Pulse 72   Temp 99.4 F (37.4 C) (Oral)   Resp 17   Ht '4\' 9"'$  (1.448 m)   Wt 81.5 kg   SpO2 98%   BMI 38.88 kg/m  Lethargic, difficult to arouse Protecting airway Pupils right gaze preference Head ct improved Will continue to monitor closely

## 2022-04-09 NOTE — Progress Notes (Addendum)
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 s/p tumor resection 7/3. Recent DC from [facility] 6/22. PMH: CKD stage 3, HFpEF, a fib,  meningioma.   OT comments  Nyomie made limited progress towards her goals. She required total A +2 for bed mobility with no initiation of movement, no attempt at verbalizations, limited visual tracking, and up to total A for sitting balance. Pt initially lethargic and unable to rouse, once sitting EOB pt opened her eyes. She was able to sustain sitting balance with close min G for 3-5 seconds at a time 3x; no attempt at righting reactions with LOB. She continues to benefit from OT acutely. D/c to SNF is appropriate at this time.   Due to pt's continued presentation with limited progress with OT, palliative consult is recommended.    Recommendations for follow up therapy are one component of a multi-disciplinary discharge planning process, led by the attending physician.  Recommendations may be updated based on patient status, additional functional criteria and insurance authorization.    Follow Up Recommendations  Skilled nursing-short term rehab (<3 hours/day)    Assistance Recommended at Discharge Frequent or constant Supervision/Assistance  Patient can return home with the following  Two people to help with walking and/or transfers;Two people to help with bathing/dressing/bathroom;Direct supervision/assist for medications management;Assistance with feeding;Assistance with cooking/housework;Direct supervision/assist for financial management;Assist for transportation;Help with stairs or ramp for entrance   Equipment Recommendations  None recommended by OT;Other (comment)       Precautions / Restrictions Precautions Precautions: Fall Precaution Comments: LUE edema;  seizures Restrictions Weight Bearing Restrictions: No       Mobility Bed Mobility Overal bed mobility: Needs Assistance Bed Mobility: Supine to Sit, Sit to Supine     Supine to sit: Total assist, +2 for physical assistance, +2 for safety/equipment Sit to supine: Total assist, +2 for physical assistance, +2 for safety/equipment   General bed mobility comments: totalA+2 for all aspects of bed mobility    Transfers Overall transfer level: Needs assistance                 General transfer comment: not safet to attempt due to LOA and poor sitting balance     Balance Overall balance assessment: Needs assistance Sitting-balance support: Single extremity supported, Feet unsupported Sitting balance-Leahy Scale: Poor Sitting balance - Comments: required posterior support in sitting with no attempt to catch self with sudden loss of posterior support. One instance of static sitting with min guard but <10 seconds and patient with L posterolateral lean. Postural control: Posterior lean, Left lateral lean                                 ADL either performed or assessed with clinical judgement   ADL Overall ADL's : Needs assistance/impaired               Functional mobility during ADLs: Total assistance General ADL Comments: Total A for all aspects of care    Extremity/Trunk Assessment Upper Extremity Assessment Upper Extremity Assessment: RUE deficits/detail;LUE deficits/detail RUE Deficits / Details: Full PROM, no attempt to participate in AAROM. No attempt to use fro righting reaction or spontaneous movements RUE Coordination: decreased fine motor;decreased gross motor LUE Deficits / Details: decreased PROM, no attempt at active movement LUE Coordination:  decreased fine motor;decreased gross motor   Lower Extremity Assessment Lower Extremity Assessment: Defer to PT evaluation        Vision   Vision Assessment?: Vision impaired- to be further tested  in functional context Eye Alignment: Impaired (comment) Ocular Range of Motion: Impaired-to be further tested in functional context Alignment/Gaze Preference: Gaze left Tracking/Visual Pursuits: Impaired - to be further tested in functional context Additional Comments: eyes closed 75% of the session - opened once sitting EOB. sustained L gaze the majority of the time. would track to the left with max stimulation, did not track past midline   Perception Perception Perception: Not tested   Praxis Praxis Praxis: Not tested    Cognition Arousal/Alertness: Lethargic Behavior During Therapy: Flat affect Overall Cognitive Status: Difficult to assess                 General Comments: Not following commands. Eyes opened in sitting with minimal tracking to L but does not achieve midline or cross midline to R. Mostly upward and to the L gaze during session.              General Comments VSS on 2L [place], BPs consistently low with map >60    Pertinent Vitals/ Pain       Pain Assessment Pain Assessment: Faces Faces Pain Scale: Hurts a little bit Pain Location: general grimacing with some PROM Pain Descriptors / Indicators: Grimacing, Moaning Pain Intervention(s): Limited activity within patient's tolerance, Monitored during session   Frequency  Min 2X/week        Progress Toward Goals  OT Goals(current goals can now be found in the care plan section)  Progress towards OT goals: Progressing toward goals  Acute Rehab OT Goals Patient Stated Goal: unable OT Goal Formulation: Patient unable to participate in goal setting Time For Goal Achievement: 04/12/22 Potential to Achieve Goals: Fair ADL Goals Pt Will Perform Grooming: with min assist;sitting Pt Will Perform Upper Body Bathing: with min assist;sitting Pt Will Perform Lower Body Bathing: with mod assist;sit to/from stand Pt Will Perform Lower Body Dressing: with modified independence;sitting/lateral leans;sit to/from  stand Pt Will Transfer to Toilet: with mod assist;stand pivot transfer;bedside commode Pt Will Perform Toileting - Clothing Manipulation and hygiene: with modified independence;sitting/lateral leans;sit to/from stand Additional ADL Goal #1: Pt will visually locate at least 3 objects in the room with minimal directional cues  Plan Discharge plan remains appropriate    Co-evaluation    PT/OT/SLP Co-Evaluation/Treatment: Yes Reason for Co-Treatment: Complexity of the patient's impairments (multi-system involvement);For patient/therapist safety;To address functional/ADL transfers PT goals addressed during session: Mobility/safety with mobility OT goals addressed during session: ADL's and self-care      AM-PAC OT "6 Clicks" Daily Activity     Outcome Measure   Help from another person eating meals?: Total Help from another person taking care of personal grooming?: Total Help from another person toileting, which includes using toliet, bedpan, or urinal?: Total Help from another person bathing (including washing, rinsing, drying)?: Total Help from another person to put on and taking off regular upper body clothing?: Total Help from another person to put on and taking off regular lower body clothing?: Total 6 Click Score: 6    End of Session Equipment Utilized During Treatment: Oxygen  OT Visit Diagnosis: Unsteadiness on feet (R26.81);Other abnormalities of gait and mobility (R26.89);Muscle weakness (generalized) (M62.81);Low vision, both eyes (H54.2);Other symptoms and signs involving cognitive function;Pain   Activity Tolerance Patient limited by lethargy   Patient Left in bed;with  call bell/phone within reach;with bed alarm set   Nurse Communication Mobility status        Time: 7092-9574 OT Time Calculation (min): 24 min  Charges: OT General Charges $OT Visit: 1 Visit OT Treatments $Therapeutic Activity: 8-22 mins    Jaquasha Carnevale A Jamell Opfer 04/09/2022, 2:11 PM

## 2022-04-10 DIAGNOSIS — D320 Benign neoplasm of cerebral meninges: Secondary | ICD-10-CM | POA: Diagnosis not present

## 2022-04-10 DIAGNOSIS — R569 Unspecified convulsions: Secondary | ICD-10-CM | POA: Diagnosis not present

## 2022-04-10 DIAGNOSIS — D696 Thrombocytopenia, unspecified: Secondary | ICD-10-CM | POA: Diagnosis not present

## 2022-04-10 DIAGNOSIS — I482 Chronic atrial fibrillation, unspecified: Secondary | ICD-10-CM | POA: Diagnosis not present

## 2022-04-10 LAB — CBC WITH DIFFERENTIAL/PLATELET
Abs Immature Granulocytes: 0 10*3/uL (ref 0.00–0.07)
Basophils Absolute: 0 10*3/uL (ref 0.0–0.1)
Basophils Relative: 0 %
Eosinophils Absolute: 0.1 10*3/uL (ref 0.0–0.5)
Eosinophils Relative: 1 %
HCT: 22.6 % — ABNORMAL LOW (ref 36.0–46.0)
Hemoglobin: 7.2 g/dL — ABNORMAL LOW (ref 12.0–15.0)
Lymphocytes Relative: 7 %
Lymphs Abs: 0.4 10*3/uL — ABNORMAL LOW (ref 0.7–4.0)
MCH: 28.5 pg (ref 26.0–34.0)
MCHC: 31.9 g/dL (ref 30.0–36.0)
MCV: 89.3 fL (ref 80.0–100.0)
Monocytes Absolute: 0 10*3/uL — ABNORMAL LOW (ref 0.1–1.0)
Monocytes Relative: 0 %
Neutro Abs: 4.8 10*3/uL (ref 1.7–7.7)
Neutrophils Relative %: 92 %
Platelets: 96 10*3/uL — ABNORMAL LOW (ref 150–400)
RBC: 2.53 MIL/uL — ABNORMAL LOW (ref 3.87–5.11)
RDW: 17 % — ABNORMAL HIGH (ref 11.5–15.5)
WBC: 5.2 10*3/uL (ref 4.0–10.5)
nRBC: 0 % (ref 0.0–0.2)
nRBC: 1 /100 WBC — ABNORMAL HIGH

## 2022-04-10 LAB — BASIC METABOLIC PANEL
Anion gap: 8 (ref 5–15)
BUN: 29 mg/dL — ABNORMAL HIGH (ref 8–23)
CO2: 23 mmol/L (ref 22–32)
Calcium: 8.1 mg/dL — ABNORMAL LOW (ref 8.9–10.3)
Chloride: 103 mmol/L (ref 98–111)
Creatinine, Ser: 0.78 mg/dL (ref 0.44–1.00)
GFR, Estimated: >60 mL/min (ref >60)
Glucose, Bld: 142 mg/dL — ABNORMAL HIGH (ref 70–99)
Potassium: 3.8 mmol/L (ref 3.5–5.1)
Sodium: 134 mmol/L — ABNORMAL LOW (ref 135–145)

## 2022-04-10 LAB — GLUCOSE, CAPILLARY
Glucose-Capillary: 132 mg/dL — ABNORMAL HIGH (ref 70–99)
Glucose-Capillary: 119 mg/dL — ABNORMAL HIGH (ref 70–99)
Glucose-Capillary: 157 mg/dL — ABNORMAL HIGH (ref 70–99)
Glucose-Capillary: 147 mg/dL — ABNORMAL HIGH (ref 70–99)
Glucose-Capillary: 136 mg/dL — ABNORMAL HIGH (ref 70–99)
Glucose-Capillary: 167 mg/dL — ABNORMAL HIGH (ref 70–99)

## 2022-04-10 LAB — MRSA NEXT GEN BY PCR, NASAL: MRSA by PCR Next Gen: NOT DETECTED

## 2022-04-10 MED ORDER — METRONIDAZOLE 500 MG/100ML IV SOLN
500.0000 mg | Freq: Two times a day (BID) | INTRAVENOUS | Status: DC
  Administered 2022-04-10 – 2022-04-11 (×3): 500 mg via INTRAVENOUS
  Filled 2022-04-10 (×3): qty 100

## 2022-04-10 NOTE — Progress Notes (Signed)
   04/10/22 0740  Assess: MEWS Score  Temp (!) 100.5 F (38.1 C)  BP 120/81  MAP (mmHg) 92  Pulse Rate 98  ECG Heart Rate 98  Resp 20  Level of Consciousness Responds to Voice  SpO2 95 %  O2 Device Nasal Cannula  Assess: MEWS Score  MEWS Temp 1  MEWS Systolic 0  MEWS Pulse 0  MEWS RR 0  MEWS LOC 1  MEWS Score 2  MEWS Score Color Yellow  Assess: if the MEWS score is Yellow or Red  Were vital signs taken at a resting state? Yes  Focused Assessment No change from prior assessment  Does the patient meet 2 or more of the SIRS criteria? Yes  Does the patient have a confirmed or suspected source of infection? Yes  Provider and Rapid Response Notified? No  MEWS guidelines implemented *See Row Information* Yes  Treat  MEWS Interventions Administered scheduled meds/treatments  Pain Scale PAINAD  Pain Score 0  Breathing 0  Negative Vocalization 0  Facial Expression 0  Body Language 0  Consolability 0  PAINAD Score 0  Take Vital Signs  Increase Vital Sign Frequency  Yellow: Q 2hr X 2 then Q 4hr X 2, if remains yellow, continue Q 4hrs  Escalate  MEWS: Escalate Yellow: discuss with charge nurse/RN and consider discussing with provider and RRT  Notify: Charge Nurse/RN  Name of Charge Nurse/RN Notified Sherry  Date Charge Nurse/RN Notified 04/10/22  Time Charge Nurse/RN Notified 0800  Document  Patient Outcome Other (Comment) (Pt LOC is no change from previous assessment, pt temp improved to 99.1 after administered PRN Tylenol .)  Progress note created (see row info) Yes  Assess: SIRS CRITERIA  SIRS Temperature  0  SIRS Pulse 1  SIRS Respirations  0  SIRS WBC 1  SIRS Score Sum  2

## 2022-04-10 NOTE — Progress Notes (Signed)
Patient ID: Tara [name], female   DOB: 02-06-1946, 76 y.o.   MRN: [id number] BP 117/68   Pulse 87   Temp 98.5 F (36.9 C) (Oral)   Resp (!) 29   Ht '4\' 9"'$  (1.448 m)   Wt 77.7 kg   SpO2 94%   BMI 37.07 kg/m  Opening eyes, not speaking, not tracking. Rightward gaze Moving extremities Wound is clean, dry, without signs of infection Remains lethargic

## 2022-04-10 NOTE — Progress Notes (Signed)
PROGRESS NOTE    Tara [name]  VHQ:[id number] DOB: 09-19-1946 DOA: 03/15/2022 PCP: [name], MD   Brief Narrative:  Patient is a 76 year old African-American female currently with past medical history significant for but not limited to anxiety, depression, diabetes mellitus type 2, hypertension, hyperlipidemia, cardiomyopathy, history of atrial fibrillation on anticoagulation with Eliquis, chronic kidney disease stage IIIb, history of breast cancer status post chemoradiation in 2008 as well as other comorbidities who presented to the hospital on 03/15/2022 with new onset seizures.  She had a known sphenoidal meningioma that was noted to be increased in size on the CT scan with surrounding edema so she is transferred from [facility] for further evaluation.  She underwent LTM with no further seizures and was evaluated by neurosurgery and underwent bifrontal craniotomy for meningioma resection on 03/24/2020 and was transferred to the intensive care unit postoperatively with PCCM consulting while she was in ICU.  Current clinical course and significant Hospital events include the following but not limited to:   Significant Hospital Events: Including procedures, antibiotic start and stop dates in addition to other pertinent events   6/24 presented to [facility] ED with new onset seizures, CTH with enlarging meningioma with edema, loaded with Keppra 7/3 Underwent bifrontal craniotomy for meningioma, monitor in ICU 7/5 stable, no further seizure activity 7/6 less responsive, two episodes of vomiting overnight and increased oral secretions today.  Continuous blinking on exam. CTH yesterday with new 7 mm thick subdural hematoma at the foramen magnum and tracking adjacent to the basion and odontoid not currently causing critical stenosis of the foramen magnum per radiology read, but merits surveillance. 7/7 Better night and more awake and responsive, repeat head CT unchanged and no seizures on video EEG 7/9  Recurrent seizure overnight requiring '4mg'$  of IV ativan to break. This am less responsive with forced right gaze  7/10 no further seizure activity, awake, responding, no forced gaze 7/11 recurrent sz like activity. Not correlated on EEG. CT unchanged.     Assessment and Plan:  Planum Sphenoidal meningioma, enlarging with new vasogenic edema and new onset seizures History is consistent with tonic-clonic seizure with no previous history of seizures Neurosurgery is following and she is status post bifrontal craniotomy for meningioma resection Continue Levetiracetam 1000 mg per Tube BID and Lacosamide 200 mg per Tube BID per neurology recommendations Seizure precautions with PRN benzodiazepines Repeat CT head unremarkable Patient is 2 mg of IV lorazepam every 10 minutes as needed Continue PT/OT evaluation and they are recommend SNF  Fever in setting of questionable new pneumonia and possible aspiration Had a T Max of 100.5 this AM, currently with no leukocytosis BC x2 NGTD UA unremarkable for infection, UC showing multiple species Chest x-ray done and showed "New hazy opacities projecting over both upper lobes in the suprahilar regions could reflect developing infection in the correct clinical setting. 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 per tube daily, and Rosuvastatin 20 mg per tube daily   Type 2 DM Continue SSI, Accu-Cheks, hypoglycemic protocol   GERD/GI prophylaxis Continue pantoprazole 40 mg per tube daily   Dysphagia Continue tube feeds per nutritionist recommendations with Glucerna 1.5 at 1000 mL per tube at 40 MLS per hour every 24 hours as well as Prosource tube feeding liquid 45 MLS per tube twice daily Nutrition Status: Nutrition Problem: Inadequate oral intake Etiology: inability to eat Signs/Symptoms: NPO status Interventions: Tube feeding, MVI SLP evaluated and recommending n.p.o.  Obesity Lifestyle modification advised -Estimated body mass index is 37.07 kg/m as calculated from the following:   Height as of this encounter: '4\' 9"'$  (1.448 m).   Weight as of this encounter: 77.7 kg.   GOC discussion Due to multiple comorbidities, minimally responsive state, overall poor prognosis Palliative care consulted for further goals of care discussion     DVT prophylaxis: SCDs Start: 03/26/22 0840 Place TED hose Start: 03/24/22 0616 Place and maintain sequential compression device Start: 03/23/22 0631 apixaban (ELIQUIS) tablet 5 mg    Code Status: Full Code Family Communication: Discussed with her husband at bedside  Disposition Plan:  Level of care: Progressive Status is: Inpatient Remains inpatient appropriate because: Minimally responsive and will need SNF once stable for discharge   Consultants:  PCCM Neurosurgery Neurology Palliative  Procedures:  See as above  Antimicrobials:  Anti-infectives (From admission, onward)    Start     Dose/Rate Route Frequency Ordered Stop   04/10/22 1200  vancomycin (VANCOREADY) IVPB 750 mg/150 mL        750 mg 150 mL/hr over 60 Minutes Intravenous Every 24 hours 04/09/22 1034     04/10/22 0900   metroNIDAZOLE (FLAGYL) IVPB 500 mg        500 mg 100 mL/hr over 60 Minutes Intravenous Every 12 hours 04/10/22 0754     04/09/22 2300  ceFEPIme (MAXIPIME) 2 g in sodium chloride 0.9 % 100 mL IVPB        2 g 200 mL/hr over 30 Minutes Intravenous Every 12 hours 04/09/22 1035     04/09/22 1045  vancomycin (VANCOREADY) IVPB 1750 mg/350 mL        1,750 mg 175 mL/hr over 120 Minutes Intravenous  Once 04/09/22 0953 04/09/22 1344   04/09/22 1045  ceFEPIme (MAXIPIME) 2 g in sodium chloride 0.9 % 100 mL IVPB        2 g 200 mL/hr over 30 Minutes Intravenous  Once 04/09/22 0953 04/09/22 1524   03/27/22 0945  Ampicillin-Sulbactam (UNASYN) 3 g in sodium chloride 0.9 % 100 mL IVPB        3 g 200 mL/hr over 30 Minutes Intravenous Every 6 hours 03/27/22 0859 04/01/22 0518   03/25/22 0600  ceFAZolin (ANCEF) IVPB 2g/100 mL premix        2 g 200 mL/hr over 30 Minutes Intravenous On call to O.R. 03/24/22 1215 03/24/22 1440       Subjective: Patient seen and examined at bedside, still remains minimally responsive.  Poor prognosis, palliative consulted   Objective: Vitals:   04/10/22 0957 04/10/22 1114 04/10/22 1450 04/10/22 1808  BP: (!) 102/58 (!) 96/50 (!) 106/54 117/68  Pulse: 74 77 79 87  Resp: 20 19 (!) 26 (!) 29  Temp: 99.1 F (37.3 C) 98.4 F (36.9 C) 98.5 F (36.9 C)  TempSrc: Axillary Oral Oral   SpO2: 96% 97% 94% 94%  Weight:      Height:        Intake/Output Summary (Last 24 hours) at 04/10/2022 1829 Last data filed at 04/10/2022 1451 Gross per 24 hour  Intake 2716 ml  Output 1300 ml  Net 1416 ml   Filed Weights   04/07/22 0500 04/08/22 0500 04/10/22 0432  Weight: 82.6 kg 81.5 kg 77.7 kg   Examination: Physical Exam: General: NAD, minimally responsive, noted large scar across forehead Cardiovascular: S1, S2 present Respiratory: CTAB Abdomen: Soft, nontender, nondistended, bowel sounds present Musculoskeletal: No bilateral pedal edema noted Skin: Normal Psychiatry:  Unable to assess Neurology: Unable to follow commands   Data Reviewed: I have personally reviewed following labs and imaging studies  CBC: Recent Labs  Lab 04/06/22 0322 04/07/22 0915 04/08/22 1008 04/09/22 0221 04/10/22 0422  WBC 11.4* 8.0 7.2 6.2 5.2  NEUTROABS  --  7.5 6.6 5.7 4.8  HGB 7.9* 7.9* 7.4* 7.5* 7.2*  HCT 24.6* 24.8* 22.6* 23.2* 22.6*  MCV 88.5 89.2 87.6 89.2 89.3  PLT 93* 92* 85* 85* 96*   Basic Metabolic Panel: Recent Labs  Lab 04/04/22 0502 04/05/22 0240 04/06/22 0322 04/07/22 0915 04/08/22 1008 04/09/22 0221 04/10/22 0422  NA 137 138 137 136 136 135 134*  K 4.3 4.2 4.5 3.8 3.5 4.0 3.8  CL 101 106 105 103 103 101 103  CO2 21* 24 18* 25 24 21* 23  GLUCOSE 110* 128* 102* 126* 147* 115* 142*  BUN 38* 38* 38* 32* 26* 26* 29*  CREATININE 0.95 1.00 0.91 0.88 0.83 0.75 0.78  CALCIUM 8.6* 8.5* 8.2* 8.1* 8.1* 8.1* 8.1*  MG 2.3 2.4  --  2.1 2.1 2.0  --   PHOS 3.6 4.0  --  3.4 2.5 2.8  --    GFR: Estimated Creatinine Clearance: 51.2 mL/min (by C-G formula based on SCr of 0.78 mg/dL). Liver Function Tests: Recent Labs  Lab 04/07/22 0915 04/08/22 1008 04/09/22 0221  AST '24 24 26  '$ ALT 36 36 36  ALKPHOS 87 74 68  BILITOT 0.5 0.6 0.5  PROT 5.6* 5.5* 5.3*  ALBUMIN 2.3* 2.1* 2.0*   No results for input(s): "LIPASE", "AMYLASE" in the last 168 hours. No results for input(s): "AMMONIA" in the last 168 hours. Coagulation Profile: No results for input(s): "INR", "PROTIME" in the last 168 hours. Cardiac Enzymes: No results for input(s): "CKTOTAL", "CKMB", "CKMBINDEX", "TROPONINI" in the last 168 hours. BNP (last 3 results) No results for input(s): "PROBNP" in the last 8760 hours. HbA1C: No results for input(s): "HGBA1C" in the last 72 hours. CBG: Recent Labs  Lab 04/09/22 1927 04/10/22 0342 04/10/22 0738 04/10/22 1112 04/10/22 1449  GLUCAP 120* 132* 119* 157* 147*   Lipid Profile: No results for input(s): "CHOL", "HDL", "LDLCALC", "TRIG",  "CHOLHDL", "LDLDIRECT" in the last 72 hours. Thyroid Function Tests: No results for input(s): "TSH", "T4TOTAL", "FREET4", "T3FREE", "THYROIDAB" in the last 72 hours. Anemia Panel: No results for input(s): "VITAMINB12", "FOLATE", "FERRITIN", "TIBC", "IRON", "RETICCTPCT" in the last 72 hours. Sepsis Labs: No results for input(s): "PROCALCITON", "LATICACIDVEN" in the last 168 hours.  Recent Results (from the past 240 hour(s))  Urine Culture     Status: Abnormal   Collection Time: 04/08/22  8:40 AM   Specimen: Urine, Clean Catch  Result Value Ref Range Status   Specimen Description URINE, CLEAN CATCH  Final   Special Requests   Final    NONE Performed at [facility]  [facility], [place] [address] [place], [place] [address]    Culture MULTIPLE SPECIES PRESENT, SUGGEST RECOLLECTION (A)  Final   Report Status 04/09/2022 FINAL  Final  Culture, blood (Routine X 2) w Reflex to ID Panel     Status: None (Preliminary result)   Collection Time: 04/08/22 10:08 AM   Specimen: BLOOD  Result Value Ref Range Status   Specimen Description BLOOD LEFT ANTECUBITAL  Final   Special Requests   Final    BOTTLES DRAWN AEROBIC AND ANAEROBIC Blood Culture adequate volume   Culture   Final    NO GROWTH 2 DAYS Performed at [facility], [place] [address] [place], [place] [address]    Report Status PENDING  Incomplete  Culture, blood (Routine X 2) w Reflex to ID Panel     Status: None (Preliminary result)   Collection Time: 04/08/22 10:12 AM   Specimen: BLOOD LEFT FOREARM  Result Value Ref Range Status   Specimen Description BLOOD LEFT FOREARM  Final   Special Requests   Final    BOTTLES DRAWN AEROBIC AND ANAEROBIC Blood Culture adequate volume   Culture   Final    NO GROWTH 2 DAYS Performed at [facility], [place] [address], [place], [place] [address]    Report Status PENDING  Incomplete  MRSA Next Gen by PCR, Nasal     Status: None   Collection Time: 04/10/22 11:58 AM   Specimen: Nasal Mucosa;  Nasal Swab  Result Value Ref Range Status   MRSA by PCR Next Gen NOT DETECTED NOT DETECTED Final    Comment: (NOTE) The GeneXpert MRSA Assay (FDA approved for NASAL specimens only), is one component of a comprehensive MRSA colonization surveillance program. It is not intended to diagnose MRSA infection nor to guide or monitor treatment for MRSA infections. Test performance is not FDA approved in patients less than 88 years old. Performed at [facility], [place] [address], [place],  [address]      Radiology Studies: CT HEAD WO CONTRAST (5MM)  Result Date: 04/09/2022 CLINICAL DATA:  Mental status change EXAM: CT HEAD WITHOUT CONTRAST TECHNIQUE: Contiguous axial images were obtained from the base of the skull through the vertex without intravenous contrast. RADIATION DOSE REDUCTION: This exam was performed according to the departmental dose-optimization program which includes automated exposure control, adjustment of the mA and/or kV according to patient size and/or use of iterative reconstruction technique. COMPARISON:  04/01/2022 FINDINGS: Brain: No worrisome or unexpected change since the study of 8 days ago. Previous bifrontal craniotomy for resection of planum sphenoidale meningioma. Postoperative extra-axial fluid and air is slightly diminished. No worsening or progressive finding. Some blood products at the site of the resected tumor recumbent less dense. Small amount of subarachnoid blood is becoming less dense. No sign of postoperative infarction or hydrocephalus. Vascular: There is atherosclerotic calcification of the major vessels at the base of the brain. Skull: Otherwise negative. Sinuses/Orbits: Clear/normal Other: None IMPRESSION: Expected evolutionary postoperative findings. Diminishing extra-axial fluid and air in the frontal regions subsequent to previous frontal craniotomy for resection of planum sphenoidale meningioma. No new bleeding, mass effect or hydrocephalus.  Electronically Signed   By: Nelson Chimes M.D.   On: 04/09/2022 11:41   DG Chest Port 1 View  Result Date: 04/09/2022 CLINICAL DATA:  Shortness of breath, cough EXAM: PORTABLE CHEST 1 VIEW COMPARISON:  04/08/2022 FINDINGS: Cardiomegaly. Multiple calcified right hilar lymph nodes and/or right lung nodules. Unchanged heterogeneous and interstitial  airspace opacity, most concentrated in the right upper lobe although also seen in the left upper lobe. The visualized skeletal structures are unremarkable. IMPRESSION: 1. Cardiomegaly. 2. Unchanged heterogeneous and interstitial airspace opacity. Electronically Signed   By: Delanna Ahmadi M.D.   On: 04/09/2022 08:29     Scheduled Meds:  amiodarone  200 mg Per Tube Daily   apixaban  5 mg Per Tube BID   carvedilol  6.25 mg Per Tube BID WC   Chlorhexidine Gluconate Cloth  6 each Topical Daily   feeding supplement (GLUCERNA 1.5 CAL)  1,000 mL Per Tube Q24H   feeding supplement (PROSource TF)  45 mL Per Tube BID   free water  200 mL Per Tube Q6H   insulin aspart  0-15 Units Subcutaneous Q4H   insulin glargine-yfgn  10 Units Subcutaneous Daily   lacosamide  200 mg Per Tube BID   levETIRAcetam  1,000 mg Per Tube BID   montelukast  10 mg Per Tube QHS   multivitamin with minerals  1 tablet Per Tube Daily   mouth rinse  15 mL Mouth Rinse 4 times per day   pantoprazole sodium  40 mg Per Tube Daily   polyethylene glycol  17 g Per NG tube Daily   rosuvastatin  20 mg Per Tube Daily   senna  1 tablet Per Tube BID   sertraline  25 mg Per Tube Daily   Continuous Infusions:  ceFEPime (MAXIPIME) IV 2 g (04/10/22 1149)   metronidazole 500 mg (04/10/22 0848)   vancomycin 750 mg (04/10/22 1243)     LOS: 26 days   Alma Friendly, MD [facility] Available via Epic secure chat 7am-7pm After these hours, please refer to coverage provider listed on [email or web address] 04/10/2022, 6:29 PM

## 2022-04-10 NOTE — Progress Notes (Signed)
SLP Cancellation Note  Patient Details Name: Tara [name] MRN: [id number] DOB: 05/29/1946   Cancelled treatment:       Reason Eval/Treat Not Completed: Patient's level of consciousness. Husband in room, says she hasnt been responsive for days. SLP observed family try and wake pt up, but she did not respond.    [name], [name] 04/10/2022, 12:24 PM

## 2022-04-10 NOTE — Significant Event (Signed)
Rapid Response Event Note   Reason for Call : Low oxygen saturations (80%) Initial Focused Assessment:  Nursing staff notified me of Ms. Sikora's low oxygen saturations of 83% on 3L [place]. Upon my arrival, her oxygen saturations were 80% on 5L [place] and she was coughing with thick tan secretions. RR 35 and frequently coughing. BBS Rhonchi with more coarse sounds on the right. Pt was turned to the left and NTS x2 with copious amounts of thick tan secretions. Pt was placed on 6L [place] HFNC and sats came up to 95%. Ms. Hewson has a strong cough reflex but unable to effectively clear her secretions.   2230-98.2F, HR 74, 117/68 (80), RR 26 with sats 95% on 6L [place] HFNC.    Interventions:  -NTS x2  Plan of Care:  -Primary RN to Notify primary svc of event and further orders   MD Notified: per primary RN Call Time: 2148 Arrival Time: 2155 End Time: 2240  Madelynn Done, RN

## 2022-04-11 ENCOUNTER — Inpatient Hospital Stay (HOSPITAL_COMMUNITY): Payer: Medicare Other

## 2022-04-11 DIAGNOSIS — Z7189 Other specified counseling: Secondary | ICD-10-CM | POA: Diagnosis not present

## 2022-04-11 DIAGNOSIS — Z515 Encounter for palliative care: Secondary | ICD-10-CM

## 2022-04-11 DIAGNOSIS — I482 Chronic atrial fibrillation, unspecified: Secondary | ICD-10-CM | POA: Diagnosis not present

## 2022-04-11 DIAGNOSIS — T17908A Unspecified foreign body in respiratory tract, part unspecified causing other injury, initial encounter: Secondary | ICD-10-CM | POA: Diagnosis not present

## 2022-04-11 DIAGNOSIS — J9601 Acute respiratory failure with hypoxia: Secondary | ICD-10-CM | POA: Diagnosis not present

## 2022-04-11 DIAGNOSIS — D320 Benign neoplasm of cerebral meninges: Secondary | ICD-10-CM | POA: Diagnosis not present

## 2022-04-11 DIAGNOSIS — R569 Unspecified convulsions: Secondary | ICD-10-CM | POA: Diagnosis not present

## 2022-04-11 DIAGNOSIS — D696 Thrombocytopenia, unspecified: Secondary | ICD-10-CM | POA: Diagnosis not present

## 2022-04-11 LAB — GLUCOSE, CAPILLARY
Glucose-Capillary: 174 mg/dL — ABNORMAL HIGH (ref 70–99)
Glucose-Capillary: 152 mg/dL — ABNORMAL HIGH (ref 70–99)
Glucose-Capillary: 102 mg/dL — ABNORMAL HIGH (ref 70–99)
Glucose-Capillary: 98 mg/dL (ref 70–99)
Glucose-Capillary: 150 mg/dL — ABNORMAL HIGH (ref 70–99)
Glucose-Capillary: 157 mg/dL — ABNORMAL HIGH (ref 70–99)

## 2022-04-11 LAB — CBC WITH DIFFERENTIAL/PLATELET
Abs Immature Granulocytes: 0.05 10*3/uL (ref 0.00–0.07)
Basophils Absolute: 0 10*3/uL (ref 0.0–0.1)
Basophils Relative: 0 %
Eosinophils Absolute: 0 10*3/uL (ref 0.0–0.5)
Eosinophils Relative: 0 %
HCT: 21.6 % — ABNORMAL LOW (ref 36.0–46.0)
Hemoglobin: 7.1 g/dL — ABNORMAL LOW (ref 12.0–15.0)
Immature Granulocytes: 1 %
Lymphocytes Relative: 6 %
Lymphs Abs: 0.4 10*3/uL — ABNORMAL LOW (ref 0.7–4.0)
MCH: 28.2 pg (ref 26.0–34.0)
MCHC: 32.9 g/dL (ref 30.0–36.0)
MCV: 85.7 fL (ref 80.0–100.0)
Monocytes Absolute: 0.2 10*3/uL (ref 0.1–1.0)
Monocytes Relative: 3 %
Neutro Abs: 5.5 10*3/uL (ref 1.7–7.7)
Neutrophils Relative %: 90 %
Platelets: 112 10*3/uL — ABNORMAL LOW (ref 150–400)
RBC: 2.52 MIL/uL — ABNORMAL LOW (ref 3.87–5.11)
RDW: 17.1 % — ABNORMAL HIGH (ref 11.5–15.5)
WBC Morphology: INCREASED
WBC: 6.1 10*3/uL (ref 4.0–10.5)
nRBC: 0 % (ref 0.0–0.2)

## 2022-04-11 LAB — BASIC METABOLIC PANEL
Anion gap: 10 (ref 5–15)
BUN: 35 mg/dL — ABNORMAL HIGH (ref 8–23)
CO2: 21 mmol/L — ABNORMAL LOW (ref 22–32)
Calcium: 8.1 mg/dL — ABNORMAL LOW (ref 8.9–10.3)
Chloride: 104 mmol/L (ref 98–111)
Creatinine, Ser: 0.82 mg/dL (ref 0.44–1.00)
GFR, Estimated: >60 mL/min (ref >60)
Glucose, Bld: 161 mg/dL — ABNORMAL HIGH (ref 70–99)
Potassium: 3.9 mmol/L (ref 3.5–5.1)
Sodium: 135 mmol/L (ref 135–145)

## 2022-04-11 LAB — FERRITIN: Ferritin: 440 ng/mL — ABNORMAL HIGH (ref 11–307)

## 2022-04-11 LAB — VITAMIN B12: Vitamin B-12: 356 pg/mL (ref 180–914)

## 2022-04-11 LAB — FOLATE: Folate: 9.5 ng/mL (ref >5.9)

## 2022-04-11 LAB — IRON AND TIBC
Iron: 15 ug/dL — ABNORMAL LOW (ref 28–170)
Saturation Ratios: 8 % — ABNORMAL LOW (ref 10.4–31.8)
TIBC: 193 ug/dL — ABNORMAL LOW (ref 250–450)
UIBC: 178 ug/dL

## 2022-04-11 MED ORDER — BUDESONIDE 0.25 MG/2ML IN SUSP
0.2500 mg | Freq: Two times a day (BID) | RESPIRATORY_TRACT | Status: DC
  Administered 2022-04-11 – 2022-05-22 (×76): 0.25 mg via RESPIRATORY_TRACT
  Filled 2022-04-11 (×84): qty 2

## 2022-04-11 MED ORDER — IPRATROPIUM-ALBUTEROL 0.5-2.5 (3) MG/3ML IN SOLN
3.0000 mL | Freq: Four times a day (QID) | RESPIRATORY_TRACT | Status: AC
Start: 2022-04-11 — End: 2022-04-13
  Administered 2022-04-11 – 2022-04-13 (×8): 3 mL via RESPIRATORY_TRACT
  Filled 2022-04-11 (×8): qty 3

## 2022-04-11 MED ORDER — SODIUM CHLORIDE 0.9 % IV SOLN
3.0000 g | Freq: Three times a day (TID) | INTRAVENOUS | Status: DC
  Administered 2022-04-11 – 2022-04-12 (×2): 3 g via INTRAVENOUS
  Filled 2022-04-11 (×4): qty 8

## 2022-04-11 MED ORDER — FUROSEMIDE 10 MG/ML IJ SOLN
40.0000 mg | Freq: Once | INTRAMUSCULAR | Status: AC
  Administered 2022-04-11: 40 mg via INTRAVENOUS
  Filled 2022-04-11: qty 4

## 2022-04-11 NOTE — Consult Note (Signed)
Palliative Medicine Inpatient Consult Note  Consulting Provider: Alma Friendly, MD  Reason for consult:   Tara [name] Palliative Medicine Consult  Reason for Consult? Pt with hx of meningioma s/p resection, CHF, A-fib, fever, multiple comorbidities, has remained only responsive since surgery, poor prognosis.  Consult for further goals of care discussion, thanks   04/11/2022  HPI:  Per intake H&P --> Patient is a 76 year old African-American female currently with past medical history significant for but not limited to anxiety, depression, diabetes mellitus type 2, hypertension, hyperlipidemia, cardiomyopathy, history of atrial fibrillation on anticoagulation with Eliquis, chronic kidney disease stage IIIb, history of breast cancer status post chemoradiation in 2008 as well as other comorbidities who presented to the hospital on 03/15/2022 with new onset seizures.  She had a known sphenoidal meningioma that was noted to be increased in size on the CT scan with surrounding edema so she is transferred from [facility] for further evaluation.  She underwent LTM with no further seizures and was evaluated by neurosurgery and underwent bifrontal craniotomy for meningioma resection on 03/24/2020. She has been minimally responsive since. Has had a prolonged 27 day hospitalization. Presently Palliative care has been asked to get involved to discuss goals of care.   Clinical Assessment/Goals of Care:  *Please note that this is a verbal dictation therefore any spelling or grammatical errors are due to the "[facility]" system interpretation.  I have reviewed medical records including EPIC notes, labs and imaging, received report from bedside RN, assessed the patient.    I met with Tara [name] to further discuss diagnosis prognosis, GOC, EOL wishes, disposition and options.   I introduced Palliative Medicine as specialized medical care for people living with serious illness. It  focuses on providing relief from the symptoms and stress of a serious illness. The goal is to improve quality of life for both the patient and the family.  Medical History Review and Understanding:  A brief review of Tara [name]'s medical history inclusive of her history of type 2 diabetes, hypertension, cardiomyopathy, atrial fibrillation, anxiety/depression, stage III chronic kidney disease, prior breast cancer and more recent newly diagnosed meningioma was had.  Social History:  Tara [name] is from [place], [place] originally.  She has been married to her husband Tara [name] over the past 67 years and they share 2 children together, 2 grandchildren, and 2 great-grandchildren.  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  At home she enjoys both reading the Bible and watching television.  Tara [name] also gets great joy out of going on local trips and taking her great-grandchildren on shopping excursions.  She is a woman of the [place].  Functional and Nutritional State:  Prior to her prolonged almost month-long hospitalization Tara [name] was fully functional and independent per her husband.  He shares that he had noticed, she was going downhill" slowly over time as she was having more difficulty doing simple tasks and what actually brought her to the hospital was a fairly significant fall.  Tara [name] had an appropriate appetite prior to hospitalization though now she is being artificially supported through a core track feeding tube.  Patient's spouse shares that this has been held because she cannot continues to "choke".  Advance Directives:  A detailed discussion was had today regarding advanced directives.  Tara [name] shares that he thinks he had made a living well and he does know that he was recently meeting with a  lawyer to make some modifications of their financial well though he had to have a recent carpal tunnel  surgery and then Tara [name] became ill.  This inhibited Tara [name] from completing the necessary paperwork with his lawyer.  Code Status:  A comprehensive discussion was had in the setting of cardiopulmonary resuscitation.  We went through the various scenarios which could result in chest compressions, shocks, and/or intubation.  Reviewed that often you do not do want intervention in an emergent situation without other interventions.  Tara [name] can say with fair certainty that he understands Tara [name] would not want to be on a mechanical ventilator though being that his daughter is also a nurse and this is her mother he wants to have that conversation with her as well.  Tara [name] does not think Tara [name] would ever wish to live in a state whereby she was supported by machines or alternatively have interventions that would provide her with a poorer quality of life than she presently has.  Tara [name] will speak to his daughter and provide clarity on cardiopulmonary resuscitation status in the oncoming days.  Provided "Hard Choices for [employer]" booklet.   Discussion:  A discussion was held in the setting of Tara [name] prolonged hospitalization.  We reviewed that Tara [name] has been very involved in conversations about where to go from here more specifically the long-term plan.  Per Tara [name] Tara [name] had stated after surgical intervention to give [name] a total of 21 days prior to making any big decisions pertaining to what to do moving forward.  Tara [name] and I discussed the different avenues which could be traveled 1 which would be that of an aggressive modality of care inclusive of rehabilitation though I shared it may be precluded by Tara [name]'s tenuous mental state.  We also discussed an alternative modality of care which would be that of hospice and comfort oriented care.  Tara [name] had a lot of questions in terms of the distinctive differences between palliative care and hospice care which we were able to go over in great  detail:  Palliative care is specialized medical care for people living with a serious illness, such as cancer, heart failure, COPD, Alzheimer's dementia, etc. Patients in palliative care may receive medical care for their symptoms, or palliative care, along with treatment intended to cure their serious illness   Hospice care focuses on the care, comfort, and quality of life of a person with a serious illness who is approaching the end of life. At some [name], it may not be possible to cure a serious illness, or a patient may choose not to undergo certain treatments. Hospice is designed for this situation.  At this [name] in time Tara [name] would like to allow Tara [name] the 21 days recommended and on Tuesday to reconvene to further determine what direction to go with her care.  We reviewed that that is very reasonable from a neurological perspective though there are other concerns in the setting of her aspirational events.  I shared with is very important is to know whether or not Yesmin would wish to be on a ventilator or not and I stated if any decision is to be made sooner than later if that would be the one I would prioritize.  We discussed the use of artificial nutrition and how it does not inhibit the likelihood of ongoing continued aspirational risk as we are still constantly producing saliva.  Tara [name] understands this and shares he will speak to his family sooner than later.  We spoke a great deal  about undue suffering in Tara [name] lamented on the recent losses in his family and how he has seen people on artificial measures of support which he does not believe Tara [name] would want if it were not going to improve her quality of life.  He shares that he would prefer to not see her suffer if at all possible though he wants to speak more to his son and daughter to hear their thoughts and respect those as well.  Discussed the importance of continued conversation with family and their  medical providers regarding  overall plan of care and treatment options, ensuring decisions are within the context of the patients values and GOCs.  Decision Maker: Falletta,Tara [name] (Spouse): [phone] (Mobile)  SUMMARY OF RECOMMENDATIONS   FULL CODE/ FULL SCOPE OF CARE --> Tara [name] would like to speak to his son and daughter prior to making Jordana a possible DO NOT RESUSCITATE Tara [name] would like to allow Tara [name] 21 days postoperatively to see where she may end up in terms of baseline level of function mentally and physically  Differences between palliative care and hospice care were reviewed  Ongoing aspirational risk was discussed in the context of Schwanda's acute illness  Encouraged Tara [name] to have Ameliah's pastor come in to offer ongoing prayer  Palliative care will continue to follow and support Tara [name] during hospitalization  Code Status/Advance Care Planning: FULL CODE  Palliative Prophylaxis:  Aspiration, Bowel Regimen, Delirium Protocol, Frequent Pain Assessment, Oral Care, Palliative Wound Care, and Turn Reposition  Additional Recommendations (Limitations, Scope, Preferences): Continue current scope of care  Psycho-social/Spiritual:  Desire for further Chaplaincy support: Yes Additional Recommendations: Education on chronic disease processes and long-term outlook's   Prognosis: Very limited in the setting of patient's acute on chronic comorbid conditions, recent surgical procedure, increased frailty, new baseline level of physical function, and prolonged hospitalization.  Exceptionally high 31-month mortality risk.  Discharge Planning: Discharge plan is unclear at this juncture.   Vitals:   04/11/22 0112 04/11/22 0308  BP:  (!) 105/50  Pulse:  87  Resp:  (!) 25  Temp:  98.3 F (36.8 C)  SpO2: 92% 90%    Intake/Output Summary (Last 24 hours) at 04/11/2022 0736 Last data filed at 04/11/2022 0346 Gross per 24 hour  Intake 1404.67 ml  Output 1300 ml  Net 104.67 ml   Last Weight  Most  recent update: 04/11/2022  5:46 AM    Weight  81.1 kg (178 lb 12.7 oz)             Gen: Frail elderly African-American female HEENT: Suturing across forehead, core track in place, dry mucous membranes CV: Regular rate and rhythm, no murmurs rubs or gallops PULM: On 8LPM [place], breathing even and nonlabored ABD: soft/nontender EXT: Generalized edema Neuro: Opens eyes but does not verbally respond  PPS: 10%   This conversation/these recommendations were discussed with patient primary care team, Dr. Horris Latino  Total Time: 65  Billing based on MDM: High  Problems Addressed: One acute or chronic illness or injury that poses a threat to life or bodily function  Amount and/or Complexity of Data: Category 3:Discussion of management or test interpretation with external physician/other qualified health care professional/appropriate source (not separately reported)  Risks: Decision regarding hospitalization or escalation of hospital care and Decision not to resuscitate or to de-escalate care because of poor prognosis ______________________________________________________ [place] Team Team Cell Phone: [phone] Please utilize secure chat with additional questions, if there is no response within  30 minutes please call the above phone number  Palliative Medicine Team providers are available by phone from 7am to 7pm daily and can be reached through the team cell phone.  Should this patient require assistance outside of these hours, please call the patient's attending physician.

## 2022-04-11 NOTE — Progress Notes (Signed)
Pharmacy Antibiotic Note  Tara [name] is a 76 y.o. female with PNA on cefepime and to change to [facility] for concern of aspiration PNA. Pharmacy consulted to dose unasyn -WBC= 100.5, tmax= 100.5 -CrCl ~ 50 -CXR shows opacities bilaterally  Plan: -Unasyn 3gm IV q8h -Will follow renal function, cultures and clinical progress    Height: '4\' 9"'$  (144.8 cm) Weight: 81.1 kg (178 lb 12.7 oz) IBW/kg (Calculated) : 38.6  Temp (24hrs), Avg:98.9 F (37.2 C), Min:98.3 F (36.8 C), Max:99.3 F (37.4 C)  Recent Labs  Lab 04/07/22 0915 04/08/22 1008 04/09/22 0221 04/10/22 0422 04/11/22 0635  WBC 8.0 7.2 6.2 5.2 6.1  CREATININE 0.88 0.83 0.75 0.78 0.82     Estimated Creatinine Clearance: 51.2 mL/min (by C-G formula based on SCr of 0.82 mg/dL).    Allergies  Allergen Reactions   Aspirin Other (See Comments)    Reports GI bleed--pt is currently taking   Lisinopril Cough   Sudafed [Pseudoephedrine Hcl]     Pt reports she had drainage in throat that made her throat hurt.     Unasyn 7/6 >> 7/11; 7/21 Vanc 7/19 >> 7/21 Cefepime 7/19 >>  7/21  7/18 UCx - multiple 7/18 BCx - NGTD  Hildred Laser, PharmD Clinical Pharmacist **Pharmacist phone directory can now be found on [email or web address] ([email or web address]).  Listed under [place].

## 2022-04-11 NOTE — Progress Notes (Signed)
Physical Therapy Treatment Patient Details Name: Tara [name] MRN: [id number] DOB: Apr 13, 1946 Today's Date: 04/11/2022   History of Present Illness 76 yo woman admitted 03/15/22 with new onset seizures related to meningioma. s/p tumor resection 7/3. Recent DC from [facility] 6/22. PMH: CKD stage 3, HFpEF, a fib,  meningioma.    PT Comments    The pt was seen for continued mobility progression and OOB attempts. However, the pt remains lethargic this morning with no verbalizations or attempts at communication throughout session. She maintains R sided gaze with no attempts to visually track and no head movements, no command following noted throughout session despite attempts from PT or when spouse encouraged to engage pt. The pt continues to require totalA to complete rolling in bed for lift pad placement, and assist of +2 with use of lift to complete transfer OOB. Once seated in recliner, pt with no change in alertness or responsiveness, but did maintain eyes open through entire session. Will continue to follow, pt will need continued use of lift for OOB attempts at this time.     Recommendations for follow up therapy are one component of a multi-disciplinary discharge planning process, led by the attending physician.  Recommendations may be updated based on patient status, additional functional criteria and insurance authorization.  Follow Up Recommendations  Skilled nursing-short term rehab (<3 hours/day) Can patient physically be transported by private vehicle: No   Assistance Recommended at Discharge Frequent or constant Supervision/Assistance  Patient can return home with the following Two people to help with walking and/or transfers;Two people to help with bathing/dressing/bathroom;Assistance with cooking/housework;Direct supervision/assist for medications management;Help with stairs or ramp for entrance;Direct supervision/assist for financial management;Assist for transportation;Assistance with  feeding   Equipment Recommendations  Wheelchair (measurements PT);Wheelchair cushion (measurements PT);Hospital bed;Other (comment) (hoyer lift)    Recommendations for Other Services       Precautions / Restrictions Precautions Precautions: Fall Precaution Comments: LUE edema; seizures Restrictions Weight Bearing Restrictions: No     Mobility  Bed Mobility Overal bed mobility: Needs Assistance Bed Mobility: Rolling Rolling: Total assist         General bed mobility comments: totalA of 1 to complete rolling in bed for pericare and placement of lift pad, pt with no initiation to assist or participate in movement    Transfers Overall transfer level: Needs assistance Equipment used: Ambulation equipment used Transfers: Bed to chair/wheelchair/BSC             General transfer comment: pt transferred with maximove to recliner, +2 for lines/safety. SpO2 96% RR to mid 30s Transfer via Lift Equipment: Maximove  Modified Rankin (Stroke Patients Only) Modified Rankin (Stroke Patients Only) Pre-Morbid Rankin Score: Moderately severe disability Modified Rankin: Severe disability     Balance Overall balance assessment: Needs assistance Sitting-balance support: No upper extremity supported Sitting balance-Leahy Scale: Poor Sitting balance - Comments: dependent on posterior support, unable to assist with or maintain forward lean away from support                                    Cognition Arousal/Alertness: Lethargic Behavior During Therapy: Flat affect Overall Cognitive Status: Difficult to assess Area of Impairment: Following commands, Attention, Problem solving, Safety/judgement                       Following Commands:  (no command following this session) Safety/Judgement: Decreased awareness of deficits, Decreased  awareness of safety   Problem Solving: Slow processing, Decreased initiation, Difficulty sequencing, Requires verbal cues,  Requires tactile cues General Comments: pt with no command following or verbalizations. eyes open but no visual tracking to PT or spouse.        Exercises General Exercises - Lower Extremity Ankle Circles/Pumps: PROM, Both, 15 reps, Seated Long Arc Quad: PROM, Both, 10 reps, Sidelying Heel Slides: PROM, Both, 10 reps, Supine Hip Flexion/Marching: PROM, Both, 10 reps, Seated    General Comments General comments (skin integrity, edema, etc.): Spouse present but not participating. states the patient has been looking at him and turning her head "all morning" but did not demo any visual tracking or head movement during session. SpO2 >91% on 8L, spouse concerned about increased wheezing in sitting, RR to mid 30s, O2 increased to 9L during rest after movement with RR returning to 30 only, SpO2 100%      Pertinent Vitals/Pain Pain Assessment Pain Assessment: No/denies pain Faces Pain Scale: No hurt Pain Intervention(s): Monitored during session, Repositioned     PT Goals (current goals can now be found in the care plan section) Acute Rehab PT Goals Patient Stated Goal: per family, OOB to chair more often PT Goal Formulation: Patient unable to participate in goal setting Time For Goal Achievement: 04/21/22 Potential to Achieve Goals: Fair Progress towards PT goals: Not progressing toward goals - comment (lethargic, not able to participate)    Frequency    Min 3X/week      PT Plan Current plan remains appropriate       AM-PAC PT "6 Clicks" Mobility   Outcome Measure  Help needed turning from your back to your side while in a flat bed without using bedrails?: Total Help needed moving from lying on your back to sitting on the side of a flat bed without using bedrails?: Total Help needed moving to and from a bed to a chair (including a wheelchair)?: Total Help needed standing up from a chair using your arms (e.g., wheelchair or bedside chair)?: Total Help needed to walk in  hospital room?: Total Help needed climbing 3-5 steps with a railing? : Total 6 Click Score: 6    End of Session Equipment Utilized During Treatment: Oxygen Activity Tolerance: Patient limited by lethargy Patient left: in chair;with call bell/phone within reach;with chair alarm set;with family/visitor present Nurse Communication: Mobility status;Need for lift equipment PT Visit Diagnosis: Unsteadiness on feet (R26.81);Muscle weakness (generalized) (M62.81);Other symptoms and signs involving the nervous system (R29.898);Other abnormalities of gait and mobility (R26.89);Difficulty in walking, not elsewhere classified (R26.2)     Time: 6283-1517 PT Time Calculation (min) (ACUTE ONLY): 35 min  Charges:  $Therapeutic Activity: 23-37 mins                     West Carbo, PT, DPT   Acute Rehabilitation Department   Sandra Cockayne 04/11/2022, 9:04 AM

## 2022-04-11 NOTE — Progress Notes (Signed)
Patient ID: Tara [name], female   DOB: 1945-10-04, 76 y.o.   MRN: [id number] BP (!) 115/58   Pulse 86   Temp 99.3 F (37.4 C) (Oral)   Resp (!) 27   Ht '4\' 9"'$  (1.448 m)   Wt 81.1 kg   SpO2 98%   BMI 38.69 kg/m  Lethargic, arouses to moderate stimulation Grunted, no words Perrl,  Moving all extremities Wound is clean, dry.  Being treated for pneumonia

## 2022-04-11 NOTE — Progress Notes (Signed)
PROGRESS NOTE    Tara [name]  JYN:[id number] DOB: 06-08-1946 DOA: 03/15/2022 PCP: [name], MD   Brief Narrative:  Patient is a 76 year old African-American female currently with past medical history significant for but not limited to anxiety, depression, diabetes mellitus type 2, hypertension, hyperlipidemia, cardiomyopathy, history of atrial fibrillation on anticoagulation with Eliquis, chronic kidney disease stage IIIb, history of breast cancer status post chemoradiation in 2008 as well as other comorbidities who presented to the hospital on 03/15/2022 with new onset seizures.  She had a known sphenoidal meningioma that was noted to be increased in size on the CT scan with surrounding edema so she is transferred from [facility] for further evaluation.  She underwent LTM with no further seizures and was evaluated by neurosurgery and underwent bifrontal craniotomy for meningioma resection on 03/24/2020 and was transferred to the intensive care unit postoperatively with PCCM consulting while she was in ICU.  Current clinical course and significant Hospital events include the following but not limited to:   Significant Hospital Events: Including procedures, antibiotic start and stop dates in addition to other pertinent events   6/24 presented to [facility] ED with new onset seizures, CTH with enlarging meningioma with edema, loaded with Keppra 7/3 Underwent bifrontal craniotomy for meningioma, monitor in ICU 7/5 stable, no further seizure activity 7/6 less responsive, two episodes of vomiting overnight and increased oral secretions today.  Continuous blinking on exam. CTH yesterday with new 7 mm thick subdural hematoma at the foramen magnum and tracking adjacent to the basion and odontoid not currently causing critical stenosis of the foramen magnum per radiology read, but merits surveillance. 7/7 Better night and more awake and responsive, repeat head CT unchanged and no seizures on video EEG 7/9  Recurrent seizure overnight requiring '4mg'$  of IV ativan to break. This am less responsive with forced right gaze  7/10 no further seizure activity, awake, responding, no forced gaze 7/11 recurrent sz like activity. Not correlated on EEG. CT unchanged.     Assessment and Plan:  Planum Sphenoidal meningioma, enlarging with new vasogenic edema and new onset seizures History is consistent with tonic-clonic seizure with no previous history of seizures Neurosurgery is following and she is status post bifrontal craniotomy for meningioma resection Continue Levetiracetam 1000 mg per Tube BID and Lacosamide 200 mg per Tube BID per neurology recommendations Seizure precautions with PRN benzodiazepines Repeat CT head unremarkable Patient is 2 mg of IV lorazepam every 10 minutes as needed Continue PT/OT evaluation and they are recommend SNF  Fever in setting of questionable new pneumonia and possible aspiration Acute hypoxic respiratory failure Afebrile x24 hours BC x2 NGTD UA unremarkable for infection, UC showing multiple species Chest x-ray done and showed "New hazy opacities projecting over both upper lobes in the suprahilar regions could reflect developing infection in the correct clinical setting. Consider repeat upright PA and lateral radiographs for better evaluation as indicated" Switch to IV Unasyn Gave 1 dose of IV Lasix, DuoNebs, nebulizers PCCM reconsulted, fever aspiration pneumonia, continue to hold tube feeds Continue supplemental oxygen as needed   ABLA Iron deficiency anemia Acute on chronic thrombocytopenia No evidence of active bleeding Type and screen Anemia panel iron 15, sats 8, ferritin 440, vitamin B12-->356 Continue monitor for signs and symptoms bleeding; no overt bleeding noted Daily CBC   Anxiety and Depression Continue with sertraline 25 mg per tube daily    HTN 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 per tube daily, and Rosuvastatin 20 mg per tube daily   Type 2 DM Continue SSI, Accu-Cheks, hypoglycemic protocol   GERD/GI prophylaxis Continue pantoprazole 40 mg per tube daily   Dysphagia Hold tube feeds for now On Glucerna 1.5 at 1000 mL per tube at 40 MLS per hour every 24 hours as well as Prosource tube feeding liquid 45 MLS per tube twice daily  Nutrition Status: Nutrition Problem: Inadequate oral intake Etiology: inability to eat Signs/Symptoms: NPO status Interventions: Tube feeding, MVI SLP evaluated and recommending n.p.o.  Obesity Lifestyle modification advised -Estimated body mass index is 38.69 kg/m as calculated from the following:   Height as of this encounter: '4\' 9"'$  (1.448 m).   Weight as of this encounter: 81.1 kg.   GOC discussion Due to multiple comorbidities, minimally responsive state, overall poor prognosis Palliative care consulted for further goals of care discussion     DVT prophylaxis: SCDs Start: 03/26/22 0840 Place TED hose Start: 03/24/22 0616 Place and maintain sequential compression device Start: 03/23/22 0631 apixaban (ELIQUIS) tablet 5 mg    Code Status: Full Code Family Communication: Discussed with her husband at bedside  Disposition Plan:  Level of care: Progressive Status is: Inpatient Remains inpatient appropriate because: Minimally responsive and will need SNF once stable for discharge   Consultants:  PCCM Neurosurgery Neurology Palliative  Procedures:  See as above  Antimicrobials:  Anti-infectives (From admission, onward)    Start     Dose/Rate Route Frequency Ordered Stop   04/11/22 1845  Ampicillin-Sulbactam (UNASYN) 3 g in sodium chloride 0.9 % 100  mL IVPB        3 g 200 mL/hr over 30 Minutes Intravenous Every 8 hours 04/11/22 1754     04/10/22 1200  vancomycin (VANCOREADY) IVPB 750 mg/150 mL  Status:  Discontinued        750 mg 150 mL/hr over 60 Minutes Intravenous Every 24 hours 04/09/22 1034 04/11/22 1100   04/10/22 0900  metroNIDAZOLE (FLAGYL) IVPB 500 mg  Status:  Discontinued        500 mg 100 mL/hr over 60 Minutes Intravenous Every 12 hours 04/10/22 0754 04/11/22 1754   04/09/22 2300  ceFEPIme (MAXIPIME) 2 g in sodium chloride 0.9 % 100 mL IVPB  Status:  Discontinued        2 g 200 mL/hr over 30 Minutes Intravenous Every 12 hours 04/09/22 1035 04/11/22 1717   04/09/22 1045  vancomycin (VANCOREADY) IVPB 1750 mg/350 mL        1,750 mg 175 mL/hr over 120 Minutes Intravenous  Once 04/09/22 0953 04/09/22 1344   04/09/22 1045  ceFEPIme (MAXIPIME) 2 g in sodium chloride 0.9 % 100 mL IVPB        2 g 200 mL/hr over 30 Minutes Intravenous  Once 04/09/22 0953 04/09/22 1524   03/27/22 0945  Ampicillin-Sulbactam (UNASYN) 3 g in sodium chloride 0.9 % 100 mL IVPB        3 g 200 mL/hr over 30 Minutes Intravenous Every 6 hours 03/27/22 0859 04/01/22 0518   03/25/22 0600  ceFAZolin (ANCEF) IVPB 2g/100 mL premix        2 g 200 mL/hr over 30 Minutes Intravenous On call to O.R. 03/24/22 1215 03/24/22 1440       Subjective: Patient seen  and examined at bedside, continues to be unresponsive.  Noted to have increased work of breathing and requiring more oxygen/hypoxic   Objective: Vitals:   04/11/22 0904 04/11/22 1129 04/11/22 1424 04/11/22 1706  BP:  134/66  (!) 115/58  Pulse: 90 92 86   Resp: (!) 29 (!) 27 (!) 27   Temp:  99.3 F (37.4 C)    TempSrc:  Oral    SpO2: 97% 99% 98%   Weight:      Height:        Intake/Output Summary (Last 24 hours) at 04/11/2022 1935 Last data filed at 04/11/2022 1902 Gross per 24 hour  Intake 1054.67 ml  Output 2000 ml  Net -945.33 ml   Filed Weights   04/08/22 0500 04/10/22 0432 04/11/22 0500   Weight: 81.5 kg 77.7 kg 81.1 kg   Examination: Physical Exam: General: NAD, minimally responsive, noted large scar across forehead Cardiovascular: S1, S2 present Respiratory: Rhonchi noted bilaterally Abdomen: Soft, nontender, nondistended, bowel sounds present Musculoskeletal: No bilateral pedal edema noted Skin: Normal Psychiatry: Unable to assess Neurology: Unable to follow commands   Data Reviewed: I have personally reviewed following labs and imaging studies  CBC: Recent Labs  Lab 04/07/22 0915 04/08/22 1008 04/09/22 0221 04/10/22 0422 04/11/22 0635  WBC 8.0 7.2 6.2 5.2 6.1  NEUTROABS 7.5 6.6 5.7 4.8 5.5  HGB 7.9* 7.4* 7.5* 7.2* 7.1*  HCT 24.8* 22.6* 23.2* 22.6* 21.6*  MCV 89.2 87.6 89.2 89.3 85.7  PLT 92* 85* 85* 96* 841*   Basic Metabolic Panel: Recent Labs  Lab 04/05/22 0240 04/06/22 0322 04/07/22 0915 04/08/22 1008 04/09/22 0221 04/10/22 0422 04/11/22 0635  NA 138   < > 136 136 135 134* 135  K 4.2   < > 3.8 3.5 4.0 3.8 3.9  CL 106   < > 103 103 101 103 104  CO2 24   < > 25 24 21* 23 21*  GLUCOSE 128*   < > 126* 147* 115* 142* 161*  BUN 38*   < > 32* 26* 26* 29* 35*  CREATININE 1.00   < > 0.88 0.83 0.75 0.78 0.82  CALCIUM 8.5*   < > 8.1* 8.1* 8.1* 8.1* 8.1*  MG 2.4  --  2.1 2.1 2.0  --   --   PHOS 4.0  --  3.4 2.5 2.8  --   --    < > = values in this interval not displayed.   GFR: Estimated Creatinine Clearance: 51.2 mL/min (by C-G formula based on SCr of 0.82 mg/dL). Liver Function Tests: Recent Labs  Lab 04/07/22 0915 04/08/22 1008 04/09/22 0221  AST '24 24 26  '$ ALT 36 36 36  ALKPHOS 87 74 68  BILITOT 0.5 0.6 0.5  PROT 5.6* 5.5* 5.3*  ALBUMIN 2.3* 2.1* 2.0*   No results for input(s): "LIPASE", "AMYLASE" in the last 168 hours. No results for input(s): "AMMONIA" in the last 168 hours. Coagulation Profile: No results for input(s): "INR", "PROTIME" in the last 168 hours. Cardiac Enzymes: No results for input(s): "CKTOTAL", "CKMB",  "CKMBINDEX", "TROPONINI" in the last 168 hours. BNP (last 3 results) No results for input(s): "PROBNP" in the last 8760 hours. HbA1C: No results for input(s): "HGBA1C" in the last 72 hours. CBG: Recent Labs  Lab 04/11/22 0311 04/11/22 0736 04/11/22 1128 04/11/22 1618 04/11/22 1925  GLUCAP 174* 152* 102* 98 150*   Lipid Profile: No results for input(s): "CHOL", "HDL", "LDLCALC", "TRIG", "CHOLHDL", "LDLDIRECT" in the last 72 hours. Thyroid Function  Tests: No results for input(s): "TSH", "T4TOTAL", "FREET4", "T3FREE", "THYROIDAB" in the last 72 hours. Anemia Panel: Recent Labs    04/11/22 0422  VITAMINB12 356  FOLATE 9.5  FERRITIN 440*  TIBC 193*  IRON 15*   Sepsis Labs: No results for input(s): "PROCALCITON", "LATICACIDVEN" in the last 168 hours.  Recent Results (from the past 240 hour(s))  Urine Culture     Status: Abnormal   Collection Time: 04/08/22  8:40 AM   Specimen: Urine, Clean Catch  Result Value Ref Range Status   Specimen Description URINE, CLEAN CATCH  Final   Special Requests   Final    NONE Performed at [facility], [address] [place], [place] [address]    Culture MULTIPLE SPECIES PRESENT, SUGGEST RECOLLECTION (A)  Final   Report Status 04/09/2022 FINAL  Final  Culture, blood (Routine X 2) w Reflex to ID Panel     Status: None (Preliminary result)   Collection Time: 04/08/22 10:08 AM   Specimen: BLOOD  Result Value Ref Range Status   Specimen Description BLOOD LEFT ANTECUBITAL  Final   Special Requests   Final    BOTTLES DRAWN AEROBIC AND ANAEROBIC Blood Culture adequate volume   Culture   Final    NO GROWTH 3 DAYS Performed at [facility], [place] [address], [place], [place] [address]    Report Status PENDING  Incomplete  Culture, blood (Routine X 2) w Reflex to ID Panel     Status: None (Preliminary result)   Collection Time: 04/08/22 10:12 AM   Specimen: BLOOD LEFT FOREARM  Result Value Ref Range Status   Specimen Description  BLOOD LEFT FOREARM  Final   Special Requests   Final    BOTTLES DRAWN AEROBIC AND ANAEROBIC Blood Culture adequate volume   Culture   Final    NO GROWTH 3 DAYS Performed at [facility], [place] [address] [place], [place] [address]    Report Status PENDING  Incomplete  MRSA Next Gen by PCR, Nasal     Status: None   Collection Time: 04/10/22 11:58 AM   Specimen: Nasal Mucosa; Nasal Swab  Result Value Ref Range Status   MRSA by PCR Next Gen NOT DETECTED NOT DETECTED Final    Comment: (NOTE) The GeneXpert MRSA Assay (FDA approved for NASAL specimens only), is one component of a comprehensive MRSA colonization surveillance program. It is not intended to diagnose MRSA infection nor to guide or monitor treatment for MRSA infections. Test performance is not FDA approved in patients less than 23 years old. Performed at [facility], [place] [address], [place],  [address]      Radiology Studies: DG CHEST PORT 1 VIEW  Result Date: 04/11/2022 CLINICAL DATA:  Dyspnea EXAM: PORTABLE CHEST 1 VIEW COMPARISON:  Radiographs 04/09/2022 FINDINGS: Similar to slightly increased upper lung predominant airspace opacities bilaterally. Remainder unchanged. Calcified right hilar nodes. Enlarged cardiomediastinal silhouette. Subdiaphragmatic enteric tube. Right axillary and right upper quadrant surgical clips. IMPRESSION: Similar to slightly increased upper lung predominant airspace opacities bilaterally. Electronically Signed   By: Placido Sou M.D.   On: 04/11/2022 09:42     Scheduled Meds:  amiodarone  200 mg Per Tube Daily   apixaban  5 mg Per Tube BID   budesonide (PULMICORT) nebulizer solution  0.25 mg Nebulization BID   carvedilol  6.25 mg Per Tube BID WC   Chlorhexidine Gluconate Cloth  6 each Topical Daily   feeding supplement (GLUCERNA  1.5 CAL)  1,000 mL Per Tube Q24H   feeding supplement (PROSource TF)  45 mL Per Tube BID   free water  200 mL Per Tube Q6H   insulin aspart   0-15 Units Subcutaneous Q4H   insulin glargine-yfgn  10 Units Subcutaneous Daily   ipratropium-albuterol  3 mL Nebulization Q6H   lacosamide  200 mg Per Tube BID   levETIRAcetam  1,000 mg Per Tube BID   montelukast  10 mg Per Tube QHS   multivitamin with minerals  1 tablet Per Tube Daily   mouth rinse  15 mL Mouth Rinse 4 times per day   pantoprazole sodium  40 mg Per Tube Daily   polyethylene glycol  17 g Per NG tube Daily   rosuvastatin  20 mg Per Tube Daily   senna  1 tablet Per Tube BID   sertraline  25 mg Per Tube Daily   Continuous Infusions:  ampicillin-sulbactam (UNASYN) IV 3 g (04/11/22 1901)     LOS: 27 days   Alma Friendly, MD [facility] Available via Epic secure chat 7am-7pm After these hours, please refer to coverage provider listed on [email or web address] 04/11/2022, 7:35 PM

## 2022-04-11 NOTE — Progress Notes (Signed)
NAME:  Tara [name], MRN:  [id number], DOB:  08-16-1946, LOS: 47 ADMISSION DATE:  03/15/2022, CONSULTATION DATE:  04/11/22 REFERRING MD:  Christella Noa, CHIEF COMPLAINT:  Meningioma, Seizure   History of Present Illness:  Tara [name] is a 76 y.o. F with PMH significant for Anxiety, Depression, T2DM, HTN, HL, cardiomyopathy, Atrial Fibrillation on Eliquis, CKD IIIb, breast Ca s/p chemoradiation in 2008 who presented to the ED 6/24 with new onset seizures.   She had a known planum sphenoidal meningioma that was increased in size on CT with surrounding edema, so was transferred to [facility] for further evaluation.  LTM showed no further seizures.  She was evaluated by neurosurgery and underwent bifrontal craniotomy for meningioma resection on 7/3 and was transferred to intensive care post-op with PCCM consult while in ICU.   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   Significant Hospital Events: Including procedures, antibiotic start and stop dates in addition to other pertinent events   6/24 presented to [facility] ED with new onset seizures, CTH with enlarging meningioma with edema, loaded with Keppra 7/3 Underwent bifrontal craniotomy for meningioma, monitor in ICU 7/5 stable, no  further seizure activity 7/6 less responsive, two episodes of vomiting overnight and increased oral secretions today.  Continuous blinking on exam. CTH yesterday with new 7 mm thick subdural hematoma at the foramen magnum and tracking adjacent to the basion and odontoid not currently causing critical stenosis of the foramen magnum per radiology read, but merits surveillance. 7/7 Better night and more awake and responsive, repeat head CT unchanged and no seizures on video EEG 7/9 Recurrent seizure overnight requiring '4mg'$  of IV ativan to break. This am less responsive with forced right gaze  7/10 no further seizure activity, awake, responding, no forced gaze 7/11 recurrent sz like activity. Not correlated on EEG. CT unchanged.  7/21 reconsulted for hypoxia and bilateral infiltrates  Interim History / Subjective:   Mental status has remained poor. Chart reviewed, discussed with RN Needing 6 L nasal cannula, rapid response event noted on 6/20 requiring nasotracheal suction x2  Objective   Blood pressure (!) 115/58, pulse 86, temperature 99.3 F (37.4 C), temperature source Oral, resp. rate (!) 27, height '4\' 9"'$  (1.448 m), weight 81.1 kg, SpO2 98 %.        Intake/Output Summary (Last 24 hours) at 04/11/2022 1707 Last data filed at 04/11/2022 1300 Gross per 24 hour  Intake 1404.67 ml  Output 1500 ml  Net -95.33 ml    Filed Weights   04/08/22 0500 04/10/22 0432 04/11/22 0500  Weight: 81.5 kg 77.7 kg 81.1 kg    General:  Elderly female, sitting up in bed HEENT: craniotomy incision clean and dry.  Neuro: Does not follow commands, smacking lip movements, leftward gaze, does not move left upper extremity CV: RRR, no MRG PULM: Clear breath sounds, intermittent accessory muscle use GI: Soft, nondistended, Non-tender. Ecchymotic areas presumably from Extremities: No acute deformity Skin: grossly intact.  Some mild ecchymosis is noted around the left eye, improving  Labs reviewed: No  leukocytosis, normal electrolytes, stable anemia hemoglobin 7.1, mild thrombocytopenia  Resolved Hospital Problem list   AKI  Assessment & Plan:   Acute respiratory failure with hypoxia Bilateral upper lobe infiltrates  -Favor aspiration pneumonia -Doubt amiodarone toxicity  -NG feeds on hold -DC cefepime in the setting of seizures and use ceftriaxone instead -NT suction as needed   Sphenoidal Meningioma, enlarging with new vasogenic edema and new onset seizures: Post-op day 7/3 s/p bifrontal craniotomy, EEG negative  Had several episodes of seizure-like activity post-op  Ongoing absence seizure type activity as well as lip smacking and facial twitching. None of which has been proven sz on EEG.  Neurosurgery is following   Continue Keppra and Vimpat per neurology recommendations Seizure precautions with PRN benzodiazepines   Thrombocytopenia -stable ABLA Acute on chronic thrombocytopenia No evidence of active bleeding.  Transfuse for hemoglobin 7 or lower  HTN HFpEF, chronic Paroxysmal Atrial Fibrillation Echo 6/9 with EF 55% and Grade I diastolic dysfunction  Continue Eliquis for stroke prophylaxis Continue Amiodarone, Coreg and Crestor.  Type 2 DM Blood sugars are controlled Continue SSI with CBG goal 140-180  GERD Continue Protonix  Dysphagia Hold tube feeds  Neuro prognosis is poor and would recommend continuing palliative conversations  Best Practice (right click and "Reselect all SmartList Selections" daily)   Diet/type: tubefeeds DVT prophylaxis: Eliquis GI prophylaxis: N/A Lines: N/A Foley:  N/A Code Status:  full code Last date of multidisciplinary goals of care discussion: Palliative care note reviewed 7/21, husband wants to wait until 7/24 and discuss with family before making decisions.  If she becomes more hypoxic we may have to force a decision sooner  Critical care: NA     Kara Mead MD. Shade Flood. [facility] Pager  : [phone]  If no response to pager , please call [phone] until 7 pm After 7:00 pm call Elink  [phone]   04/11/2022

## 2022-04-12 DIAGNOSIS — Z515 Encounter for palliative care: Secondary | ICD-10-CM

## 2022-04-12 DIAGNOSIS — R569 Unspecified convulsions: Secondary | ICD-10-CM | POA: Diagnosis not present

## 2022-04-12 DIAGNOSIS — D320 Benign neoplasm of cerebral meninges: Secondary | ICD-10-CM | POA: Diagnosis not present

## 2022-04-12 DIAGNOSIS — I482 Chronic atrial fibrillation, unspecified: Secondary | ICD-10-CM | POA: Diagnosis not present

## 2022-04-12 DIAGNOSIS — D696 Thrombocytopenia, unspecified: Secondary | ICD-10-CM | POA: Diagnosis not present

## 2022-04-12 LAB — CBC WITH DIFFERENTIAL/PLATELET
Abs Immature Granulocytes: 0.06 10*3/uL (ref 0.00–0.07)
Abs Immature Granulocytes: 0.11 10*3/uL — ABNORMAL HIGH (ref 0.00–0.07)
Basophils Absolute: 0 10*3/uL (ref 0.0–0.1)
Basophils Absolute: 0 10*3/uL (ref 0.0–0.1)
Basophils Relative: 0 %
Basophils Relative: 0 %
Eosinophils Absolute: 0 10*3/uL (ref 0.0–0.5)
Eosinophils Absolute: 0.1 10*3/uL (ref 0.0–0.5)
Eosinophils Relative: 1 %
Eosinophils Relative: 1 %
HCT: 19.8 % — ABNORMAL LOW (ref 36.0–46.0)
HCT: 30.8 % — ABNORMAL LOW (ref 36.0–46.0)
Hemoglobin: 6.5 g/dL — CL (ref 12.0–15.0)
Hemoglobin: 10.5 g/dL — ABNORMAL LOW (ref 12.0–15.0)
Immature Granulocytes: 1 %
Immature Granulocytes: 2 %
Lymphocytes Relative: 6 %
Lymphocytes Relative: 8 %
Lymphs Abs: 0.3 10*3/uL — ABNORMAL LOW (ref 0.7–4.0)
Lymphs Abs: 0.5 10*3/uL — ABNORMAL LOW (ref 0.7–4.0)
MCH: 28.1 pg (ref 26.0–34.0)
MCH: 28.1 pg (ref 26.0–34.0)
MCHC: 32.8 g/dL (ref 30.0–36.0)
MCHC: 34.1 g/dL (ref 30.0–36.0)
MCV: 85.7 fL (ref 80.0–100.0)
MCV: 82.4 fL (ref 80.0–100.0)
Monocytes Absolute: 0.2 10*3/uL (ref 0.1–1.0)
Monocytes Absolute: 0.3 10*3/uL (ref 0.1–1.0)
Monocytes Relative: 4 %
Monocytes Relative: 4 %
Neutro Abs: 4.9 10*3/uL (ref 1.7–7.7)
Neutro Abs: 5.4 10*3/uL (ref 1.7–7.7)
Neutrophils Relative %: 88 %
Neutrophils Relative %: 85 %
Platelets: 127 10*3/uL — ABNORMAL LOW (ref 150–400)
Platelets: 113 10*3/uL — ABNORMAL LOW (ref 150–400)
RBC: 2.31 MIL/uL — ABNORMAL LOW (ref 3.87–5.11)
RBC: 3.74 MIL/uL — ABNORMAL LOW (ref 3.87–5.11)
RDW: 17.6 % — ABNORMAL HIGH (ref 11.5–15.5)
RDW: 17.2 % — ABNORMAL HIGH (ref 11.5–15.5)
WBC: 5.6 10*3/uL (ref 4.0–10.5)
WBC: 6.4 10*3/uL (ref 4.0–10.5)
nRBC: 0 % (ref 0.0–0.2)
nRBC: 0 % (ref 0.0–0.2)

## 2022-04-12 LAB — GLUCOSE, CAPILLARY
Glucose-Capillary: 144 mg/dL — ABNORMAL HIGH (ref 70–99)
Glucose-Capillary: 65 mg/dL — ABNORMAL LOW (ref 70–99)
Glucose-Capillary: 110 mg/dL — ABNORMAL HIGH (ref 70–99)
Glucose-Capillary: 108 mg/dL — ABNORMAL HIGH (ref 70–99)
Glucose-Capillary: 105 mg/dL — ABNORMAL HIGH (ref 70–99)
Glucose-Capillary: 94 mg/dL (ref 70–99)
Glucose-Capillary: 107 mg/dL — ABNORMAL HIGH (ref 70–99)

## 2022-04-12 LAB — BASIC METABOLIC PANEL
Anion gap: 10 (ref 5–15)
BUN: 40 mg/dL — ABNORMAL HIGH (ref 8–23)
CO2: 25 mmol/L (ref 22–32)
Calcium: 8.2 mg/dL — ABNORMAL LOW (ref 8.9–10.3)
Chloride: 105 mmol/L (ref 98–111)
Creatinine, Ser: 0.95 mg/dL (ref 0.44–1.00)
GFR, Estimated: >60 mL/min (ref >60)
Glucose, Bld: 132 mg/dL — ABNORMAL HIGH (ref 70–99)
Potassium: 3 mmol/L — ABNORMAL LOW (ref 3.5–5.1)
Sodium: 140 mmol/L (ref 135–145)

## 2022-04-12 LAB — PREPARE RBC (CROSSMATCH)

## 2022-04-12 LAB — MAGNESIUM: Magnesium: 2.2 mg/dL (ref 1.7–2.4)

## 2022-04-12 MED ORDER — SODIUM CHLORIDE 0.9 % IV SOLN
3.0000 g | Freq: Three times a day (TID) | INTRAVENOUS | Status: DC
  Administered 2022-04-12 – 2022-04-20 (×25): 3 g via INTRAVENOUS
  Filled 2022-04-12 (×28): qty 8

## 2022-04-12 MED ORDER — POTASSIUM CHLORIDE 20 MEQ PO PACK
40.0000 meq | PACK | Freq: Once | ORAL | Status: AC
  Administered 2022-04-12: 40 meq
  Filled 2022-04-12: qty 2

## 2022-04-12 MED ORDER — POTASSIUM CHLORIDE 10 MEQ/100ML IV SOLN
10.0000 meq | INTRAVENOUS | Status: AC
  Administered 2022-04-12 (×4): 10 meq via INTRAVENOUS
  Filled 2022-04-12 (×4): qty 100

## 2022-04-12 MED ORDER — SODIUM CHLORIDE 0.9% IV SOLUTION: Freq: Once | INTRAVENOUS | Status: AC

## 2022-04-12 MED ORDER — DEXTROSE 50 % IV SOLN
INTRAVENOUS | Status: AC
  Administered 2022-04-12: 25 mL
  Filled 2022-04-12: qty 50

## 2022-04-12 MED ORDER — POTASSIUM CHLORIDE 10 MEQ/100ML IV SOLN: 10.0000 meq | INTRAVENOUS | Status: AC

## 2022-04-12 NOTE — Progress Notes (Addendum)
This RN has been unable to administer IV potassium or abx d/t limited IV access (one IV) and PRBCs running. Order placed for IV team consult but unsuccessful at placing another PIV. MD notified of request for central line. Awaiting response.   1211- Per MD will re-time IV meds after blood admin is complete. Will not place central line at this time.

## 2022-04-12 NOTE — Progress Notes (Signed)
NAME:  Tara [name], MRN:  [id number], DOB:  12/02/1945, LOS: 25 ADMISSION DATE:  03/15/2022, CONSULTATION DATE:  04/12/22 REFERRING MD:  Christella Noa, CHIEF COMPLAINT:  Meningioma, Seizure   History of Present Illness:  Tara [name] is a 76 y.o. F with PMH significant for Anxiety, Depression, T2DM, HTN, HL, cardiomyopathy, Atrial Fibrillation on Eliquis, CKD IIIb, breast Ca s/p chemoradiation in 2008 who presented to the ED 6/24 with new onset seizures.   She had a known planum sphenoidal meningioma that was increased in size on CT with surrounding edema, so was transferred to [facility] for further evaluation.  LTM showed no further seizures.  She was evaluated by neurosurgery and underwent bifrontal craniotomy for meningioma resection on 7/3 and was transferred to intensive care post-op with PCCM consult while in ICU.   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   Significant Hospital Events: Including procedures, antibiotic start and stop dates in addition to other pertinent events   6/24 presented to [facility] ED with new onset seizures, CTH with enlarging meningioma with edema, loaded with Keppra 7/3 Underwent bifrontal craniotomy for meningioma, monitor in ICU 7/5 stable, no  further seizure activity 7/6 less responsive, two episodes of vomiting overnight and increased oral secretions today.  Continuous blinking on exam. CTH yesterday with new 7 mm thick subdural hematoma at the foramen magnum and tracking adjacent to the basion and odontoid not currently causing critical stenosis of the foramen magnum per radiology read, but merits surveillance. 7/7 Better night and more awake and responsive, repeat head CT unchanged and no seizures on video EEG 7/9 Recurrent seizure overnight requiring '4mg'$  of IV ativan to break. This am less responsive with forced right gaze  7/10 no further seizure activity, awake, responding, no forced gaze 7/11 recurrent sz like activity. Not correlated on EEG. CT unchanged.  7/21 reconsulted for hypoxia and bilateral infiltrates  Interim History / Subjective:   Grunting sounds  On 8L   Hgb dropped to 6.5 from 7.1 + slight downtrend in BP   Objective   Blood pressure (!) 100/53, pulse 93, temperature 99.5 F (37.5 C), temperature source Axillary, resp. rate (!) 22, height '4\' 9"'$  (1.448 m), weight 80.4 kg, SpO2 95 %.        Intake/Output Summary (Last 24 hours) at 04/12/2022 1028 Last data filed at 04/12/2022 9242 Gross per 24 hour  Intake 718.67 ml  Output 1600 ml  Net -881.33 ml   Filed Weights   04/10/22 0432 04/11/22 0500 04/12/22 0434  Weight: 77.7 kg 81.1 kg 80.4 kg    General:  Elderly F NAD  HEENT: frontal crani site is c/d/ well approximated. Some L periorbital ecchymosis. +cortrak  Neuro: Does not follow commands. + cough  CV: rrr cap refill brisk  PULM: Symmetrical chest expansion, diminished basilar sounds anteriorly. Some upper lobe rhonchi GI: soft ndnt  Extremities: no acute deformity  Skin: pale, c/d/w    Resolved Hospital Problem list  AKI  Assessment & Plan:   Acute respiratory failure with hypoxia Suspected aspiration PNA, BUL infiltrates  Dysphagia  -Doubt amiodarone toxicity -? 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, now s/p bifrontal craniectomy -Op date 7/3  -post op had seizure like activity but none proven on eeg  P -cont keppra, vimpat -sz precautions, PRN BZD  -on eliquis for stroke ppx   Thrombocytopenia Acute on chronic anemia  -- hgb 6.5 (7/22) -Think worse anemia is iatrogenic losses & marrow suppression w prolonged hospitalization P -looks like 2 PRBC have been ordered 7/22  -goal > 7  -monitor for s/sx bleed -H/H post transfusion -consider abd imaging if not seeing predicted response to transfusion   HTN Chronic HFpEF pAF, currently sinus  Echo 6/9 with EF 55% and Grade I diastolic dysfunction P -cont amio, coreg, crestor  -cont eliquis  -- monitoring h/h trend closely.   DM2  Acute hypoglycemia (in setting of EN on hold) P -SSI   GERD -PPI  Hypokalemia -replace   GOC Agree w ongoing [place] talks. The most pressing issue would be determination of advanced airway in event of further decline / worse resp status.    Best Practice (right click and "Reselect all SmartList Selections" daily)   Diet/type: tubefeeds -- now on hold  DVT prophylaxis: Eliquis GI prophylaxis: PPI Lines: N/A Foley:  N/A Code Status:  full code Last date of multidisciplinary goals of care discussion: Palliative care note reviewed 7/21, husband wants to wait until 7/24 and discuss with family before making decisions.  If she becomes more hypoxic we may have to force a decision sooner  Critical care: NA     Eliseo Gum MSN, AGACNP-BC [place] for pager  04/12/2022, 10:28 AM

## 2022-04-12 NOTE — Progress Notes (Signed)
Palliative Medicine  Name: Tara [name] Date: 04/12/2022 MRN: [id number]  DOB: 01-21-1946  Patient Care Team: [name], MD as PCP - Bethena Roys, MD as Consulting Physician (Cardiology) Cottle, Billey Co., MD as Attending Physician (Psychiatry) Daneil Dolin, MD as Consulting Physician (Gastroenterology)    REASON FOR CONSULTATION: Tara [name] is a 76 y.o. female with multiple medical problems including cardiomyopathy, history of A-fib on anticoagulation with Eliquis, CKD stage IIIb, history of breast cancer status post chemoradiation in 2008, who was admitted to the hospital 03/15/2022 with new onset seizures she had known meningioma, which was found to have increased in size on CT with surrounding edema.  Patient underwent bilateral formal craniotomy for meningioma resection on 03/24/2020.  She has had a prolonged hospitalization with minimal improvement neurologically.  Palliative care was consulted to address goals.  CODE STATUS: Full code  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   Anxiety    Breast cancer ([place]) 2008   right - s/p lumpectomy->chemo, radiation   Depression    Dysrhythmia    hx SVT   Family history of colon cancer    Family history of prostate cancer    GERD (gastroesophageal reflux disease)    H/O: hysterectomy    Hiatal hernia    History of cancer chemotherapy    History of radiation therapy    Hyperlipidemia    Hypertension 01/25/2018   Nonischemic cardiomyopathy ([place])    Personal history of radiation therapy 01/05/209   SVT (supraventricular tachycardia) ([place])    short RP SVT documented 5/80   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[place])    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)   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    PAST SURGICAL HISTORY:  Past Surgical History:  Procedure  Laterality Date   ABDOMINAL HYSTERECTOMY  1994   fibroids,    BIOPSY  06/19/2016   Procedure: BIOPSY;  Surgeon: Daneil Dolin, MD;  [facility];  Service: Endoscopy;;  gastric duodenum   BREAST BIOPSY Right 12/16/2006   malignant   BREAST EXCISIONAL BIOPSY Right 2018   benign lumpectomy   BREAST LUMPECTOMY Right 02/2007   BREAST LUMPECTOMY WITH RADIOACTIVE SEED LOCALIZATION Right 07/29/2017   Procedure: RIGHT BREAST LUMPECTOMY WITH RADIOACTIVE SEED LOCALIZATION;  Surgeon: Fanny Skates, MD;  Location: [place];  Service: General;  Laterality: Right;   BREAST SURGERY Right 2008   lumpectomy, cancer   CARDIAC CATHETERIZATION     CHOLECYSTECTOMY  1999   COLONOSCOPY  2008   Dr. Oneida Alar: multiple polyps. Path not available at time of visit.    COLONOSCOPY WITH PROPOFOL N/A 04/25/2014   Dr. Oneida Alar: Multiple tubular adenomas removed. Diverticulosis. Moderate internal hemorrhoids. Next colonoscopy planned for August 2018.   CRANIOTOMY Bilateral 03/24/2022   Procedure: Bifrontal craniotomy for resection of meningioma;  Surgeon: Ashok Pall, MD;  Location: [place];  Service: Neurosurgery;  Laterality: Bilateral;   ESOPHAGOGASTRODUODENOSCOPY (EGD) WITH PROPOFOL N/A 06/19/2016   Procedure: ESOPHAGOGASTRODUODENOSCOPY (EGD) WITH PROPOFOL;  Surgeon: Daneil Dolin, MD;  [facility];  Service: Endoscopy;  Laterality: N/A;   MASTECTOMY W/ SENTINEL NODE BIOPSY Right 06/09/2018   Procedure: RIGHT TOTAL MASTECTOMY WITH SENTINEL LYMPH NODE BIOPSY;  Surgeon: Fanny Skates, MD;  Location: [place];  Service: General;  Laterality: Right;   POLYPECTOMY N/A 04/25/2014   Procedure: POLYPECTOMY;  Surgeon: Danie Binder, MD;  Location: [facility];  Service: Endoscopy;  Laterality: N/A;  Ascending and Decending Colon x3 , Transverse colon x2, rectal   PORTACATH PLACEMENT N/A 06/09/2018   Procedure: INSERTION PORT-A-CATH;  Surgeon: Fanny Skates, MD;  Location: [place];  Service: General;  Laterality: N/A;    RIGHT/LEFT HEART CATH AND CORONARY ANGIOGRAPHY N/A 07/10/2017   Procedure: RIGHT/LEFT HEART CATH AND CORONARY ANGIOGRAPHY;  Surgeon: Larey Dresser, MD;  Location: [facility];  Service: Cardiovascular;  Laterality: N/A;    HEMATOLOGY/ONCOLOGY HISTORY:  Oncology History  Malignant neoplasm of right breast ([place])  02/03/2007 Surgery   Right breast lumpectomy: 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  Ultrasound reveals 0.9 cm, 1.3 cm, 1 cm no abnormal lymph nodes, right breast biopsy 10 o'clock position: IDC grade 3, ER 0%, PR 0%, Ki-67 60%, HER-2 negative ratio 1.74   06/09/2018 Surgery   Right mastectomy: Grade 3 IDC, 1.2 cm, high-grade DCIS, margins negative, negative for lymphovascular or perineural invasion, ER 0%, PR 0%, HER-2 negative, Ki-67 60%   07/16/2018 - 11/12/2018 Chemotherapy   [facility] adjuvant chemotherapy for 6 cycles    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 ([facility])    ALLERGIES:  is allergic to aspirin, lisinopril, and sudafed [pseudoephedrine hcl].  MEDICATIONS:  Current Facility-Administered Medications  Medication Dose Route Frequency Provider Last Rate Last Admin   acetaminophen (TYLENOL) tablet 650 mg  650 mg Per Tube Q6H PRN Meyran, Ocie Cornfield, NP   650 mg at 04/10/22 7342   Or   acetaminophen (TYLENOL) suppository 650 mg  650 mg Rectal Q6H PRN Meyran, Ocie Cornfield, NP       amiodarone (PACERONE) tablet 200 mg  200 mg Per Tube Daily Meyran, Ocie Cornfield, NP   200 mg at 04/12/22 0941   Ampicillin-Sulbactam (UNASYN) 3 g in sodium chloride 0.9 % 100 mL IVPB  3 g Intravenous Q8H  Kris Mouton, RPH 200 mL/hr at 04/12/22 0157 3 g at 04/12/22 0157   antiseptic oral rinse (BIOTENE) solution 15 mL  15 mL Mouth Rinse PRN Meyran, Ocie Cornfield, NP       apixaban (ELIQUIS) tablet 5 mg  5 mg Per Tube BID Henri Medal, RPH   5 mg at 04/12/22 8768   bisacodyl (DULCOLAX) suppository 10 mg  10 mg Rectal Daily PRN Eleonore Chiquito, NP   10 mg at 03/31/22 0842   budesonide (PULMICORT) nebulizer solution 0.25 mg  0.25 mg Nebulization BID Alma Friendly, MD   0.25 mg at 04/12/22 0802   carvedilol (COREG) tablet 6.25 mg  6.25 mg Per Tube BID WC Meyran, Ocie Cornfield, NP   6.25 mg at 04/10/22 1808   Chlorhexidine Gluconate Cloth 2 % PADS 6 each  6 each Topical Daily Meyran, Ocie Cornfield, NP   6 each at 04/11/22 2155   feeding supplement (GLUCERNA 1.5 CAL) liquid 1,000 mL  1,000 mL Per Tube Q24H Meyran, Ocie Cornfield, NP   1,000 mL at 04/11/22 1706   feeding supplement (PROSource TF) liquid 45  mL  45 mL Per Tube BID Eleonore Chiquito, NP   45 mL at 04/12/22 4496   free water 200 mL  200 mL Per Tube Q6H Sheikh, Omair Latif, DO   200 mL at 04/12/22 0543   guaiFENesin (ROBITUSSIN) 100 MG/5ML liquid 5 mL  5 mL Per Tube Q4H PRN Meyran, Ocie Cornfield, NP   5 mL at 04/04/22 2153   HYDROcodone-acetaminophen (NORCO/VICODIN) 5-325 MG per tablet 1 tablet  1 tablet Per Tube Q4H PRN Meyran, Ocie Cornfield, NP       insulin aspart (novoLOG) injection 0-15 Units  0-15 Units Subcutaneous Q4H Meyran, Ocie Cornfield, NP   2 Units at 04/12/22 0432   insulin glargine-yfgn (SEMGLEE) injection 10 Units  10 Units Subcutaneous Daily Eleonore Chiquito, NP   10 Units at 04/11/22 0859   ipratropium-albuterol (DUONEB) 0.5-2.5 (3) MG/3ML nebulizer solution 3 mL  3 mL Nebulization Q6H Alma Friendly, MD   3 mL at 04/12/22 0802   labetalol (NORMODYNE) injection 10-40 mg  10-40 mg Intravenous Q10 min PRN Meyran, Ocie Cornfield, NP   10 mg at 03/28/22 1032   lacosamide (VIMPAT)  tablet 200 mg  200 mg Per Tube BID Eleonore Chiquito, NP   200 mg at 04/12/22 0941   levETIRAcetam (KEPPRA) 100 MG/ML solution 1,000 mg  1,000 mg Per Tube BID Eleonore Chiquito, NP   1,000 mg at 04/12/22 0941   LORazepam (ATIVAN) injection 2 mg  2 mg Intravenous Q10 min PRN Meyran, Ocie Cornfield, NP   2 mg at 03/29/22 2200   metoprolol tartrate (LOPRESSOR) injection 5 mg  5 mg Intravenous Q4H PRN Meyran, Ocie Cornfield, NP       montelukast (SINGULAIR) tablet 10 mg  10 mg Per Tube QHS Meyran, Ocie Cornfield, NP   10 mg at 04/11/22 2146   multivitamin with minerals tablet 1 tablet  1 tablet Per Tube Daily Meyran, Ocie Cornfield, NP   1 tablet at 04/12/22 0941   naloxone (NARCAN) injection 0.08 mg  0.08 mg Intravenous PRN Meyran, Ocie Cornfield, NP       ondansetron (ZOFRAN) tablet 4 mg  4 mg Oral Q6H PRN Meyran, Ocie Cornfield, NP       Or   ondansetron (ZOFRAN) injection 4 mg  4 mg Intravenous Q6H PRN Meyran, Ocie Cornfield, NP   4 mg at 03/26/22 2323   Oral care mouth rinse  15 mL Mouth Rinse PRN Ashok Pall, MD       Oral care mouth rinse  15 mL Mouth Rinse 4 times per day Eleonore Chiquito, NP   15 mL at 04/12/22 0942   pantoprazole sodium (PROTONIX) 40 mg/20 mL oral suspension 40 mg  40 mg Per Tube Daily Meyran, Ocie Cornfield, NP   40 mg at 04/12/22 0941   polyethylene glycol (MIRALAX / GLYCOLAX) packet 17 g  17 g Per NG tube Daily Meyran, Ocie Cornfield, NP   17 g at 04/09/22 1029   rosuvastatin (CRESTOR) tablet 20 mg  20 mg Per Tube Daily Meyran, Ocie Cornfield, NP   20 mg at 04/12/22 0941   senna (SENOKOT) tablet 8.6 mg  1 tablet Per Tube BID Eleonore Chiquito, NP   8.6 mg at 04/12/22 0941   senna-docusate (Senokot-S) tablet 1 tablet  1 tablet Per Tube QHS PRN Meyran, Ocie Cornfield, NP       sertraline (ZOLOFT) tablet 25 mg  25 mg Per Tube Daily Meyran, Ocie Cornfield, NP   [phone]  mg at 04/12/22 0941   sodium phosphate (FLEET) 7-19 GM/118ML enema 1 enema   1 enema Rectal Once PRN Meyran, Ocie Cornfield, NP        VITAL SIGNS: BP (!) 107/58   Pulse 83   Temp 98.7 F (37.1 C) (Axillary)   Resp 19   Ht $R'4\' 9"'qB$  (1.448 m)   Wt 177 lb 4 oz (80.4 kg)   SpO2 96%   BMI 38.36 kg/m  Filed Weights   04/10/22 0432 04/11/22 0500 04/12/22 0434  Weight: 171 lb 4.8 oz (77.7 kg) 178 lb 12.7 oz (81.1 kg) 177 lb 4 oz (80.4 kg)    Estimated body mass index is 38.36 kg/m as calculated from the following:   Height as of this encounter: $RemoveBeforeD'4\' 9"'pvnRWEEDmjImSm$  (1.448 m).   Weight as of this encounter: 177 lb 4 oz (80.4 kg).  LABS: CBC:    Component Value Date/Time   WBC 5.6 04/12/2022 0422   HGB 6.5 (LL) 04/12/2022 0422   HGB 11.1 05/28/2021 1200   HGB 12.4 10/12/2014 1000   HCT 19.8 (L) 04/12/2022 0422   HCT 34.9 05/28/2021 1200   HCT 39.3 10/12/2014 1000   PLT 127 (L) 04/12/2022 0422   PLT 288 05/28/2021 1200   MCV 85.7 04/12/2022 0422   MCV 85 05/28/2021 1200   MCV 82.2 10/12/2014 1000   NEUTROABS 4.9 04/12/2022 0422   NEUTROABS 5.6 10/12/2014 1000   LYMPHSABS 0.3 (L) 04/12/2022 0422   LYMPHSABS 2.2 10/12/2014 1000   MONOABS 0.2 04/12/2022 0422   MONOABS 0.4 10/12/2014 1000   EOSABS 0.0 04/12/2022 0422   EOSABS 0.8 (H) 10/12/2014 1000   BASOSABS 0.0 04/12/2022 0422   BASOSABS 0.0 10/12/2014 1000   Comprehensive Metabolic Panel:    Component Value Date/Time   NA 140 04/12/2022 0422   NA 142 01/02/2022 1029   NA 143 10/12/2014 1001   K 3.0 (L) 04/12/2022 0422   K 3.9 10/12/2014 1001   CL 105 04/12/2022 0422   CO2 25 04/12/2022 0422   CO2 25 10/12/2014 1001   BUN 40 (H) 04/12/2022 0422   BUN 30 (H) 01/02/2022 1029   BUN 13.9 10/12/2014 1001   CREATININE 0.95 04/12/2022 0422   CREATININE 1.47 (H) 03/22/2020 1358   CREATININE 1.0 10/12/2014 1001   GLUCOSE 132 (H) 04/12/2022 0422   GLUCOSE 105 10/12/2014 1001   CALCIUM 8.2 (L) 04/12/2022 0422   CALCIUM 8.6 10/12/2014 1001   AST 26 04/09/2022 0221   AST 10 (L) 11/12/2018 1044   AST 33  10/12/2014 1001   ALT 36 04/09/2022 0221   ALT 6 11/12/2018 1044   ALT 27 10/12/2014 1001   ALKPHOS 68 04/09/2022 0221   ALKPHOS 102 10/12/2014 1001   BILITOT 0.5 04/09/2022 0221   BILITOT 0.3 01/02/2022 1029   BILITOT <0.2 (L) 11/12/2018 1044   BILITOT 0.25 10/12/2014 1001   PROT 5.3 (L) 04/09/2022 0221   PROT 6.7 01/02/2022 1029   PROT 7.4 10/12/2014 1001   ALBUMIN 2.0 (L) 04/09/2022 0221   ALBUMIN 4.0 01/02/2022 1029   ALBUMIN 3.5 10/12/2014 1001    RADIOGRAPHIC STUDIES: DG CHEST PORT 1 VIEW  Result Date: 04/11/2022 CLINICAL DATA:  Dyspnea EXAM: PORTABLE CHEST 1 VIEW COMPARISON:  Radiographs 04/09/2022 FINDINGS: Similar to slightly increased upper lung predominant airspace opacities bilaterally. Remainder unchanged. Calcified right hilar nodes. Enlarged cardiomediastinal silhouette. Subdiaphragmatic enteric tube. Right axillary and right upper quadrant surgical clips. IMPRESSION: Similar to slightly increased upper lung predominant airspace opacities  bilaterally. Electronically Signed   By: Placido Sou M.D.   On: 04/11/2022 09:42   CT HEAD WO CONTRAST (5MM)  Result Date: 04/09/2022 CLINICAL DATA:  Mental status change EXAM: CT HEAD WITHOUT CONTRAST TECHNIQUE: Contiguous axial images were obtained from the base of the skull through the vertex without intravenous contrast. RADIATION DOSE REDUCTION: This exam was performed according to the departmental dose-optimization program which includes automated exposure control, adjustment of the mA and/or kV according to patient size and/or use of iterative reconstruction technique. COMPARISON:  04/01/2022 FINDINGS: Brain: No worrisome or unexpected change since the study of 8 days ago. Previous bifrontal craniotomy for resection of planum sphenoidale meningioma. Postoperative extra-axial fluid and air is slightly diminished. No worsening or progressive finding. Some blood products at the site of the resected tumor recumbent less dense. Small  amount of subarachnoid blood is becoming less dense. No sign of postoperative infarction or hydrocephalus. Vascular: There is atherosclerotic calcification of the major vessels at the base of the brain. Skull: Otherwise negative. Sinuses/Orbits: Clear/normal Other: None IMPRESSION: Expected evolutionary postoperative findings. Diminishing extra-axial fluid and air in the frontal regions subsequent to previous frontal craniotomy for resection of planum sphenoidale meningioma. No new bleeding, mass effect or hydrocephalus. Electronically Signed   By: Nelson Chimes M.D.   On: 04/09/2022 11:41   DG Chest Port 1 View  Result Date: 04/09/2022 CLINICAL DATA:  Shortness of breath, cough EXAM: PORTABLE CHEST 1 VIEW COMPARISON:  04/08/2022 FINDINGS: Cardiomegaly. Multiple calcified right hilar lymph nodes and/or right lung nodules. Unchanged heterogeneous and interstitial airspace opacity, most concentrated in the right upper lobe although also seen in the left upper lobe. The visualized skeletal structures are unremarkable. IMPRESSION: 1. Cardiomegaly. 2. Unchanged heterogeneous and interstitial airspace opacity. Electronically Signed   By: Delanna Ahmadi M.D.   On: 04/09/2022 08:29   DG CHEST PORT 1 VIEW  Result Date: 04/08/2022 CLINICAL DATA:  Fever, history of seizures EXAM: PORTABLE CHEST 1 VIEW COMPARISON:  Chest radiograph 04/04/2022 FINDINGS: The enteric catheter courses below the diaphragm and off the field of view. The cardiomediastinal silhouette is stable, allowing for rightward patient rotation. Calcified mediastinal and hilar lymph nodes are again seen. There is new hazy opacity projecting over both upper lobes in the suprahilar regions. There is no significant pleural effusion. There is no appreciable pneumothorax The bones are stable. IMPRESSION: New hazy opacities projecting over both upper lobes in the suprahilar regions could reflect developing infection in the correct clinical setting. Consider  repeat upright PA and lateral radiographs for better evaluation as indicated. Electronically Signed   By: Valetta Mole M.D.   On: 04/08/2022 09:18   DG Abd 1 View  Result Date: 04/04/2022 CLINICAL DATA:  Abdominal distension EXAM: ABDOMEN - 1 VIEW COMPARISON:  03/28/2022 FINDINGS: Enteric tube is present with tip overlying the distal stomach or proximal duodenum. Mild gaseous distension of colon. Cholecystectomy clips. IMPRESSION: Mild gaseous distention of colon. Electronically Signed   By: Macy Mis M.D.   On: 04/04/2022 11:16   DG CHEST PORT 1 VIEW  Result Date: 04/04/2022 CLINICAL DATA:  35329 abdominal distention EXAM: PORTABLE CHEST 1 VIEW COMPARISON:  March 27, 2022 FINDINGS: There has been interval removal of the right transjugular central venous catheter and interval insertion of a feeding tube and its distal end is not included in the image appears to be in the body of the stomach. Mild cardiomegaly. Again seen are the calcified mediastinal and hilar lymph nodes. Lungs remain clear. Postsurgical  changes of the right axilla. The visualized skeletal structures are unremarkable. IMPRESSION: 1. There has been interval removal of the right transjugular central venous catheter and interval insertion of feeding tube with its tip is below the diaphragm appears to be in the body of the stomach. 2.  Stable mild cardiomegaly.  Lungs are clear. Electronically Signed   By: Frazier Richards M.D.   On: 04/04/2022 11:07   CT HEAD WO CONTRAST (5MM)  Result Date: 04/01/2022 CLINICAL DATA:  Seizure disorder, clinical change EXAM: CT HEAD WITHOUT CONTRAST TECHNIQUE: Contiguous axial images were obtained from the base of the skull through the vertex without intravenous contrast. RADIATION DOSE REDUCTION: This exam was performed according to the departmental dose-optimization program which includes automated exposure control, adjustment of the mA and/or kV according to patient size and/or use of iterative  reconstruction technique. COMPARISON:  CT head 03/30/2022. FINDINGS: Brain: Stable edema in the inferior frontal lobes at the site of mass resection. Similar surrounding hemorrhage and bilateral subdural collections along the cerebral convexities with intermixed blood products. Stable anti dependent frontal convexity pneumocephalus. No evidence of acute large vascular territory infarct, new mass effect, midline shift, or hydrocephalus. Vascular: No hyperdense vessel identified. Skull: Bifrontal craniotomies. Sinuses/Orbits: No acute findings. Other: No mastoid effusions. IMPRESSION: Similar postoperative appearance of hemorrhage, gas and inferior frontal lobe edema. No new abnormality. Electronically Signed   By: Margaretha Sheffield M.D.   On: 04/01/2022 17:19   EEG adult  Result Date: 04/01/2022 Lora Havens, MD     04/01/2022 12:46 PM Patient Name: Tara [name] MRN: [id number] Epilepsy Attending: Lora Havens Referring Physician/Provider: Gleason, Otilio Carpen, PA-C Date: 04/01/2022 Duration: 22.24 mins  Patient history: 76 y.o. female with past medical history of anxiety, depression, GERD, hypertension, hyperlipidemia, nonischemic cardiomyopathy, atrial fibrillation on eliquis, Systolic HF, hx of breast cancer, meningioma who presented to the hospital on 03/15/2022 for evaluation of new onset seizures.  Level of alertness: Awake  AEDs during EEG study: LEV, LCM, Clonazepam  Technical aspects: This EEG study was done with scalp electrodes positioned according to the 10-20 International system of electrode placement. Electrical activity was acquired at a sampling rate of $Remov'500Hz'FlbOcb$  and reviewed with a high frequency filter of $RemoveB'70Hz'hkUEUnmJ$  and a low frequency filter of $RemoveB'1Hz'UXAKNUbu$ . EEG data were recorded continuously and digitally stored.  Description: No clear posterior dominant rhythm was seen.  Sleep was characterized by sleep spindles (12 to 14 Hz), maximal frontocentral region. EEG showed continuous 3 to 5 Hz theta-delta  slowing. Hyperventilation and photic stimulation were not performed.  Patient was noted to have left upward gaze on stimulation.  Concomitant EEG before, during and after the event did not show any EEG change to suggest seizure.  ABNORMALITY - Continuous slow  IMPRESSION: This study is suggestive moderate diffuse encephalopathy, nonspecific etiology.  No seizures or epileptiform discharges were seen throughout the recording. Patient was noted to have left upward gaze on stimulation without concomitant EEG change.  These episodes were most likely not epileptic.  However, focal motor seizures may not be seen on scalp EEG.  Therefore clinical correlation is recommended.  Priyanka Barbra Sarks   CT HEAD WO CONTRAST (5MM)  Result Date: 03/30/2022 CLINICAL DATA:  Mental status change, persistent or worsening EXAM: CT HEAD WITHOUT CONTRAST TECHNIQUE: Contiguous axial images were obtained from the base of the skull through the vertex without intravenous contrast. RADIATION DOSE REDUCTION: This exam was performed according to the departmental dose-optimization program which includes automated exposure control,  adjustment of the mA and/or kV according to patient size and/or use of iterative reconstruction technique. COMPARISON:  Three days ago FINDINGS: Brain: Bilateral inferior frontal edema and hemorrhage at site of mass resection. Continued subdural collections covering most of the cerebral convexities and containing CSF density and blood products with gas. Stable subdural hemorrhage is seen in the low posterior fossa at the anterior foramen magnum, without cervicomedullary compression. No increase in pneumocephalus. No evidence of acute infarct or hydrocephalus. Small remote left cerebellar and left occipital infarct. Vascular: No hyperdense vessel or unexpected calcification. Skull: Bifrontal craniotomy sparing the sinuses. Sinuses/Orbits: Negative IMPRESSION: Stable postoperative hemorrhage, gas, and inferior frontal  edema. No new abnormality. Electronically Signed   By: Jorje Guild M.D.   On: 03/30/2022 11:07   DG Abd Portable 1V  Result Date: 03/28/2022 CLINICAL DATA:  Reason for exam: encounter for feeding tube placement EXAM: PORTABLE ABDOMEN - 1 VIEW COMPARISON:  None Available. FINDINGS: Feeding tube has been placed, tip overlying the level of the distal stomach or proximal duodenum. RIGHT IJ central line tip overlies the level of the RIGHT atrium. Heart is mildly enlarged. Bowel gas pattern is nonobstructive. Surgical clips are present in the RIGHT UPPER QUADRANT of the abdomen. IMPRESSION: Feeding tube tip overlying the distal stomach/proximal duodenum. Electronically Signed   By: Nolon Nations M.D.   On: 03/28/2022 10:27   Overnight EEG with video  Result Date: 03/28/2022 Lora Havens, MD     03/29/2022  6:32 AM Patient Name: ESTELLAR CADENA MRN: [id number] Epilepsy Attending: Lora Havens Referring Physician/Provider: Jacky Kindle, MD Duration: 03/27/2022 1034 to 03/28/2022  1034  Patient history: 76 y.o. female with past medical history of anxiety, depression, GERD, hypertension, hyperlipidemia, nonischemic cardiomyopathy, atrial fibrillation on eliquis, Systolic HF, hx of breast cancer, meningioma who presented to the hospital on 03/15/2022 for evaluation of new onset seizures.  Level of alertness: Awake, asleep  AEDs during EEG study: LEV, LCM, Clonazepam  Technical aspects: This EEG study was done with scalp electrodes positioned according to the 10-20 International system of electrode placement. Electrical activity was acquired at a sampling rate of 500Hz  and reviewed with a high frequency filter of 70Hz  and a low frequency filter of 1Hz . EEG data were recorded continuously and digitally stored.  Description: During awake state, no clear posterior dominant rhythm was seen. Sleep was characterized by sleep spindles (12-14Hz ), maximal frontocentral region. EEG also showed continuous generalized 3 to 5  Hz theta-delta slowing.  Hyperventilation and photic stimulation were not performed.    ABNORMALITY - Continuous slow, generalized IMPRESSION: This study is suggestive of moderate diffuse encephalopathy, nonspecific etiology. No seizures or epileptiform discharges were seen throughout the recording.   Priyanka Barbra Sarks   CT HEAD WO CONTRAST (5MM)  Result Date: 03/27/2022 CLINICAL DATA:  Mental status change, unknown cause EXAM: CT HEAD WITHOUT CONTRAST TECHNIQUE: Contiguous axial images were obtained from the base of the skull through the vertex without intravenous contrast. RADIATION DOSE REDUCTION: This exam was performed according to the departmental dose-optimization program which includes automated exposure control, adjustment of the mA and/or kV according to patient size and/or use of iterative reconstruction technique. COMPARISON:  03/26/2022. FINDINGS: Brain: No substantial change in bifrontal pneumocephalus with extra-axial hemorrhage at the site of recent meningioma resection, overlying the frontal convexities, and tracking along the falx. Similar edema and/or developing encephalomalacia in the adjacent frontal lobes at the site of resection bilaterally. Similar small linear intraparenchymal hemorrhage hemorrhage in the left frontal  lobe. Similar extra-axial hemorrhage at the foramen magnum. Remote cerebellar infarcts. No evidence of acute large vascular territory infarct. No hydrocephalus. Vascular: No hyperdense vessel identified. Skull: Frontal craniotomy with overlying edema and gas. Sinuses/Orbits: Clear visualized sinuses. Other: No mastoid effusions.  Bilateral EAC cerumen. IMPRESSION: No substantial change in postoperative appearance, including pneumocephalus, extra-axial hemorrhage and intraparenchymal hemorrhage. Similar bifrontal edema and/or developing encephalomalacia at the site of resection. MRI could further evaluate if clinically warranted. Electronically Signed   By: Margaretha Sheffield  M.D.   On: 03/27/2022 12:49   DG CHEST PORT 1 VIEW  Result Date: 03/27/2022 CLINICAL DATA:  [id number] aspiration into airway EXAM: PORTABLE CHEST 1 VIEW COMPARISON:  None Available. FINDINGS: There has been interval removal of the right subclavian central venous catheter and insertion of a right transjugular central venous catheter with its tip in the proximal right atrium. No pneumothorax. Mild cardiomegaly. Stable calcified mediastinal and right hilar lymph nodes. No acute cardiopulmonary process. Stable postsurgical changes of the right axilla. The visualized skeletal structures are unremarkable. IMPRESSION: There has been interval removal of the right subclavian central venous catheter and insertion of right transjugular central venous catheter with its tip in the right atrium. No pneumothorax. Lungs remain clear. Electronically Signed   By: Frazier Richards M.D.   On: 03/27/2022 08:01   CT HEAD WO CONTRAST (5MM)  Addendum Date: 03/26/2022   ADDENDUM REPORT: 03/26/2022 11:49 ADDENDUM: The original report was by Dr. Van Clines. The following addendum is by Dr. Van Clines: These results (and specifically the presence of the subdural hematoma extending at the level of the foramen magnum) were called by telephone at the time of interpretation on 03/26/2022 at 11:47 am to Dr. Ashok Pall , who verbally acknowledged these results. Electronically Signed   By: Van Clines M.D.   On: 03/26/2022 11:49   Result Date: 03/26/2022 CLINICAL DATA:  Mental status changes, bifrontal craniotomy for meningioma resection on 03/24/2022 EXAM: CT HEAD WITHOUT CONTRAST TECHNIQUE: Contiguous axial images were obtained from the base of the skull through the vertex without intravenous contrast. RADIATION DOSE REDUCTION: This exam was performed according to the departmental dose-optimization program which includes automated exposure control, adjustment of the mA and/or kV according to patient size and/or use of  iterative reconstruction technique. COMPARISON:  Multiple exams, including 03/15/2022 FINDINGS: Brain: Blood products are present the site of the recently resected planum sphenoidale meningioma, for example on image 16 series 5, with gas and blood products in the anterior extra-axial space below the bifrontal craniotomy site. The combination of extra-axial fluid and gas anterior to the frontal lobes measures about 1.3 cm bilaterally on image 15 series 4. Subdural hematoma tracks back along the right side of the anterior falx for example on images 18 through 24 of series 4. There is some effacement of the left side of the suprasellar cistern and left sylvian fissure, and blood products in this region are not excluded. Extra-axial collections of gas and fluid, there is some other small scattered loculations of pneumocephalus and some questionable complexity in the CSF along the convexity. No hydrocephalus. Old small upper cerebellar infarcts noted. Calcifications noted along the globus pallidus nuclei bilaterally. On image 17 series 4, a 0.7 by 0.4 by 0.5 cm focus of high density is probably a small intraparenchymal hematoma in the left frontal lobe, and less likely to be within a sulcus. On image 25 of series 6, we demonstrate a new subdural hematoma at the level of the foramen magnum measuring  about 7 mm in thickness, with cephalad portion tracking along the right posterior clivus and basion, and distal portion tracking just posterior to the odontoid tip. This is not causing critical stenosis at the foramen magnum currently, but likely merit surveillance. Subdural hematomas measuring about 5 mm in thickness along the convexities. Vascular: There is atherosclerotic calcification of the cavernous carotid arteries bilaterally. Skull: Recent bifrontal craniotomy noted. Sinuses/Orbits: Unremarkable Other: Expected craniofacial soft tissue swelling and small amount of gas in the scalp. Facial soft tissue swelling extends  in the left periorbital region. IMPRESSION: 1. Postop day 2 status post bifrontal craniotomy and resection of planum sphenoidale meningioma. Gas and blood products in the anterior extra-axial space and along the resection site, along with a small amount of subdural hematoma tracking back along the anterior falx and subdural hematomas tracking back along the convexities. 2. There is some effacement or blood products in the left side of the suprasellar cistern and left sylvian fissure. 3. Suspected 7 by 4 by 5 mm left frontal intraparenchymal hematoma on image 17 series 4. It is possible that this simply represents blood products along a sulcus. 4. New 7 mm thick subdural hematoma at the foramen magnum and tracking adjacent to the basion and odontoid. This is not currently causing critical stenosis the foramen magnum but likely merits surveillance. 5. Expected craniofacial soft tissue swelling and gas in the scalp. 6. Atherosclerosis. 7. Remote small upper cerebellar infarcts. Electronically Signed: By: Van Clines M.D. On: 03/26/2022 11:24   DG Chest Port 1 View  Result Date: 03/24/2022 CLINICAL DATA:  Seizures.  Postop. EXAM: PORTABLE CHEST 1 VIEW COMPARISON:  03/15/2022 FINDINGS: A right-sided subclavian line takes an unusual course, likely extends into the left brachiocephalic vein. Right axillary node dissection. Midline trachea. Borderline cardiomegaly. Right mediastinal and hilar calcified nodes indicative of old granulomatous disease. No pleural effusion or pneumothorax. Clear lungs. IMPRESSION: No acute cardiopulmonary disease. Right-sided central line which should be repositioned. Recommend retraction approximately 5-6 cm and advancement. These results will be called to the ordering clinician or representative by the Radiologist Assistant, and communication documented in the PACS or [employer]. Electronically Signed   By: Abigail Miyamoto M.D.   On: 03/24/2022 13:30   Overnight EEG with  video  Result Date: 03/24/2022 Lora Havens, MD     03/25/2022  8:43 AM Patient Name: Tara [name] MRN: [id number] Epilepsy Attending: Lora Havens Referring Physician/Provider: August Albino, NP Duration: 03/23/2022 1531 to 03/24/2022 1134  Patient history: 76 y.o. female with past medical history of anxiety, depression, GERD, hypertension, hyperlipidemia, nonischemic cardiomyopathy, atrial fibrillation on eliquis, Systolic HF, hx of breast cancer, meningioma who presented to the hospital on 03/15/2022 for evaluation of new onset seizures.  Level of alertness: Awake, asleep  AEDs during EEG study: LEV, LCM, VPA, Clonazepam  Technical aspects: This EEG study was done with scalp electrodes positioned according to the 10-20 International system of electrode placement. Electrical activity was acquired at a sampling rate of 500Hz  and reviewed with a high frequency filter of 70Hz  and a low frequency filter of 1Hz . EEG data were recorded continuously and digitally stored.  Description:  The posterior dominant rhythm consists of 7 Hz activity of moderate voltage (25-35 uV) seen predominantly in posterior head regions, symmetric and reactive to eye opening and eye closing. Sleep was characterized by vertex waves, sleep spindles (12-14Hz ), maximal frontocentral region. EEG also showed continuous generalized and lateralized right hemisphere predominantly 5 to 9 Hz theta alpha  activity admixed with intermittent 2 to 3 Hz delta slowing in right hemisphere. Of note, parts of study were difficult to interpret due to significant chewing artifact which appeared more prominent on the right side and noted predominantly when patient was awake. Hyperventilation and photic stimulation were not performed.   Patient event button was pressed on 03/23/2022 at 2300.  Per family patient was smacking her lips. Concomitant EEG showed significant chewing artifact but no definite EEG changes suggest seizure.  ABNORMALITY - Continuous slow,  right fronto-temporal region - Background slow  IMPRESSION: This study is suggestive of cortical dysfunction arising from right fronto-temporal region likely secondary to underlying structural abnormality. Additionally there is mild to moderate diffuse encephalopathy, nonspecific etiology. No seizures or epileptiform discharges were seen throughout the recording. One event was recorded on 03/23/2022 at 2300 during which family reported patient was making smacking her lips without concomitant EEG change.  This was most likely not an epileptic event.  Lora Havens    EEG adult  Result Date: 03/23/2022 Lora Havens, MD     03/23/2022  5:09 PM Patient Name: MANNAT BENEDETTI MRN: [id number] Epilepsy Attending: Lora Havens Referring Physician/Provider: August Albino, NP Date: 03/23/2022 Duration: 23.27 mins Patient history: 76 y.o. female with past medical history of anxiety, depression, GERD, hypertension, hyperlipidemia, nonischemic cardiomyopathy, atrial fibrillation on eliquis, Systolic HF, hx of breast cancer, meningioma who presented to the hospital on 03/15/2022 for evaluation of new onset seizures. Level of alertness: Awake AEDs during EEG study: LEV, LCM, Clonazepam Technical aspects: This EEG study was done with scalp electrodes positioned according to the 10-20 International system of electrode placement. Electrical activity was acquired at a sampling rate of $Remov'500Hz'Epgkek$  and reviewed with a high frequency filter of $RemoveB'70Hz'OsZOBFBk$  and a low frequency filter of $RemoveB'1Hz'auDgRkIh$ . EEG data were recorded continuously and digitally stored. Description: The posterior dominant rhythm consists of 7 Hz activity of moderate voltage (25-35 uV) seen predominantly in posterior head regions, symmetric and reactive to eye opening and eye closing. EEG showed continuous low amplitude 2-$RemoveBeforeD'3hz'FdhTHvltlzVVFo$  delta slowing in right fronto-temporal region. Hyperventilation and photic stimulation were not performed.   Of note, study was technically difficult due to  significant chewing artifact. ABNORMALITY - Continuous slow, right fronto-temporal region - Background slow IMPRESSION: This technically difficult study is suggestive of cortical dysfunction arising from right fronto-temporal region likely secondary to underlying structural abnormality.Additionally there is mild to moderate diffuse encephalopathy, nonspecific etiology. No seizures or epileptiform discharges were seen throughout the recording. Lora Havens   MR LUMBAR SPINE WO CONTRAST  Result Date: 03/19/2022 CLINICAL DATA:  Initial evaluation for acute myelopathy. EXAM: MRI LUMBAR SPINE WITHOUT CONTRAST TECHNIQUE: Multiplanar, multisequence MR imaging of the lumbar spine was performed. No intravenous contrast was administered. COMPARISON:  None Available. FINDINGS: Segmentation: Standard. Lowest well-formed disc space labeled the L5-S1 level. Alignment: Physiologic with preservation of the normal lumbar lordosis. No listhesis. Vertebrae: Vertebral body height maintained without acute or chronic fracture. Bone marrow signal intensity within normal limits. No worrisome osseous lesions. No abnormal marrow edema. Conus medullaris and cauda equina: Conus extends to the L1 level. Conus and cauda equina appear normal. Paraspinal and other soft tissues: Paraspinous soft tissues within normal limits. 1.3 cm simple left renal cyst noted, benign in appearance, no follow-up imaging recommended. Disc levels: T11-12: Mild disc bulge with facet hypertrophy. No significant canal or foraminal stenosis. T12-L1: Unremarkable. L1-2:  Negative interspace.  Mild facet hypertrophy.  No stenosis. L2-3: Disc desiccation without  significant disc bulge. Mild facet hypertrophy. No stenosis. L3-4: Mild circumferential disc bulge and disc desiccation. Associated reactive endplate spurring. Mild facet and ligament flavum hypertrophy. Borderline mild narrowing of the lateral recesses. Central canal remains patent. Mild right L3 foraminal  stenosis. Left neural foramen remains patent. L4-5: Disc bulge with disc desiccation. Mild to moderate facet and ligament flavum hypertrophy. Resultant mild bilateral subarticular stenosis. Central canal remains patent. Mild left greater than right L4 foraminal narrowing. L5-S1: Disc desiccation with shallow posterior disc bulge. Moderate right greater than left facet hypertrophy. Epidural lipomatosis. No significant spinal stenosis. Mild bilateral L5 foraminal stenosis. IMPRESSION: 1. No acute abnormality within the lumbar spine. Conus medullaris and nerve roots of the cauda equina are within normal limits. 2. Mild disc bulging with facet hypertrophy at L3-4 and L4-5 with resultant mild bilateral subarticular stenosis. No overt neural impingement. 3. Mild right L3 with bilateral L4 and L5 foraminal stenosis related to disc bulge, reactive endplate spurring, and facet hypertrophy. 4. Mild-to-moderate multilevel facet hypertrophy, most pronounced at L5-S1. Electronically Signed   By: Jeannine Boga M.D.   On: 03/19/2022 19:23   VAS [place] UPPER EXTREMITY VENOUS DUPLEX  Result Date: 03/19/2022 UPPER VENOUS STUDY  Patient Name:  JANERA PEUGH Hochmuth  Date of Exam:   03/19/2022 Medical Rec #: [id number]       Accession #:    [id number] Date of Birth: 1945/10/08       Patient Gender: F Patient Age:   79 years Exam Location:  [facility] Procedure:      VAS [place] UPPER EXTREMITY VENOUS DUPLEX Referring Phys: Oren Binet --------------------------------------------------------------------------------  Indications: Swelling Limitations: Poor ultrasound/tissue interface and limited mobility. Comparison Study: No previous exams Performing Technologist: Jody Hill RVT, RDMS  Examination Guidelines: A complete evaluation includes B-mode imaging, spectral Doppler, color Doppler, and power Doppler as needed of all accessible portions of each vessel. Bilateral testing is considered an integral part of a complete examination.  Limited examinations for reoccurring indications may be performed as noted.  Right Findings: +----------+------------+---------+-----------+----------+-------+ RIGHT     CompressiblePhasicitySpontaneousPropertiesSummary +----------+------------+---------+-----------+----------+-------+ Subclavian    Full       Yes       Yes                      +----------+------------+---------+-----------+----------+-------+  Left Findings: +----------+------------+---------+-----------+----------+--------------+ LEFT      CompressiblePhasicitySpontaneousProperties   Summary     +----------+------------+---------+-----------+----------+--------------+ IJV           Full       Yes       Yes                             +----------+------------+---------+-----------+----------+--------------+ Subclavian    Full       Yes       Yes                             +----------+------------+---------+-----------+----------+--------------+ Axillary      Full       Yes       Yes                             +----------+------------+---------+-----------+----------+--------------+ Brachial      Full       Yes       Yes                             +----------+------------+---------+-----------+----------+--------------+  Radial        Full                                                 +----------+------------+---------+-----------+----------+--------------+ Ulnar                                               Not visualized +----------+------------+---------+-----------+----------+--------------+ Cephalic      Full                                                 +----------+------------+---------+-----------+----------+--------------+ Basilic       Full       Yes       Yes                             +----------+------------+---------+-----------+----------+--------------+  Summary:  Right: No evidence of thrombosis in the subclavian.  Left: No evidence of deep vein  thrombosis in the upper extremity. No evidence of superficial vein thrombosis in the upper extremity. However, unable to visualize the ulnar veins.  *See table(s) above for measurements and observations.  Diagnosing physician: Orlie Pollen Electronically signed by Orlie Pollen on 03/19/2022 at 5:38:50 PM.    Final    Overnight EEG with video  Result Date: 03/16/2022 Lora Havens, MD     03/17/2022 10:11 AM Patient Name: Tara [name] MRN: [id number] Epilepsy Attending: Lora Havens Referring Physician/Provider: Derek Jack, MD Duration: 03/15/2022 1343 to 03/16/2022 1055 Patient history: 76 yo woman with hx CKD stage 3, HFpEF, a fib on eliquis followed as outpatient by Dr. Reatha Armour for meningioma presenting after new onset seizures now in complex partial status.  EEG to evaluate for seizure Level of alertness: Awake, asleep AEDs during EEG study: Clonazepam, LCM, LEV Technical aspects: This EEG study was done with scalp electrodes positioned according to the 10-20 International system of electrode placement. Electrical activity was acquired at a sampling rate of $Remov'500Hz'kAokmR$  and reviewed with a high frequency filter of $RemoveB'70Hz'qnSMRwJF$  and a low frequency filter of $RemoveB'1Hz'IoaycVOx$ . EEG data were recorded continuously and digitally stored. Description: The posterior dominant rhythm consists of 8 Hz activity of moderate voltage (25-35 uV) seen predominantly in posterior head regions, symmetric and reactive to eye opening and eye closing. Sleep was characterized by vertex waves, sleep spindles (12 to 14 Hz), maximal frontocentral region. Hyperventilation and photic stimulation were not performed.   IMPRESSION: This study is within normal limits. No seizures or epileptiform discharges were seen throughout the recording. Lora Havens   EEG adult  Result Date: 03/15/2022 Greta Doom, MD     03/15/2022  4:06 PM History: 76 yo Fbeing evaluated for seizures. Sedation: none Technique: This EEG was acquired with electrodes  placed according to the International 10-20 electrode system (including Fp1, Fp2, F3, F4, C3, C4, P3, P4, O1, O2, T3, T4, T5, T6, A1, A2, Fz, Cz, Pz). The following electrodes were missing or displaced: none. Background: The background consists of intermixed alpha and beta activities. There is a well defined posterior dominant rhythm of  9 Hz that attenuates with eye opening. Sleep is recorded with normal appearing structures. Photic stimulation: Physiologic driving is not performed EEG Abnormalities: None Clinical Interpretation: This normal EEG is recorded in the waking and sleep state. There was no seizure or seizure predisposition recorded on this study. Please note that lack of epileptiform activity on EEG does not preclude the possibility of epilepsy. Ritta Slot, MD [facility] [phone] If 7pm- 7am, please page neurology on call as listed in AMION.   MR BRAIN W WO CONTRAST  Result Date: 03/15/2022 CLINICAL DATA:  Seizure, new onset, no history of trauma. EXAM: MRI HEAD WITHOUT AND WITH CONTRAST TECHNIQUE: Multiplanar, multiecho pulse sequences of the brain and surrounding structures were obtained without and with intravenous contrast. CONTRAST:  8.44mL GADAVIST GADOBUTROL 1 MMOL/ML IV SOLN COMPARISON:  Head CT from earlier today.  Brain MRI 03/05/2006 FINDINGS: Brain: Long-standing planum sphenoidale meningioma with isointense T2 weighted appearance and homogeneous enhancement. The mass measures 3.1 cm in diameter and 2.1 cm in craniocaudal span, slow growth when compared to MRI in 2007. There is local mass effect with new vasogenic edema in the adjacent left inferior frontal lobe when compared to prior MRI. No acute infarct, hydrocephalus, or collection. Small remote bilateral cerebellar infarcts. Thin section images through the bilateral hippocampus are unremarkable. Vascular: Major flow voids and vascular enhancements are preserved Skull and upper cervical spine: Normal marrow  signal Sinuses/Orbits: Negative IMPRESSION: Long-standing planum sphenoidale meningioma with slow growth. Current dimensions are 3 x 3 x 2 cm. Possibly related to the history of seizure, there is new vasogenic edema in the adjacent left frontal lobe. Small chronic cerebellar infarcts. Electronically Signed   By: Tiburcio Pea M.D.   On: 03/15/2022 12:34   DG Chest Portable 1 View  Result Date: 03/15/2022 CLINICAL DATA:  Possible seizure EXAM: PORTABLE CHEST 1 VIEW COMPARISON:  03/04/2022 FINDINGS: Normal heart size and stable mediastinal contours. Chronic granulomatous nodal changes in the right mediastinum. There is no edema, consolidation, effusion, or pneumothorax. Postoperative right axilla. IMPRESSION: Stable chest radiograph.  No evidence of active disease. Electronically Signed   By: Tiburcio Pea M.D.   On: 03/15/2022 04:49   CT Head Wo Contrast  Result Date: 03/15/2022 CLINICAL DATA:  Seizure, new onset. EXAM: CT HEAD WITHOUT CONTRAST TECHNIQUE: Contiguous axial images were obtained from the base of the skull through the vertex without intravenous contrast. RADIATION DOSE REDUCTION: This exam was performed according to the departmental dose-optimization program which includes automated exposure control, adjustment of the mA and/or kV according to patient size and/or use of iterative reconstruction technique. COMPARISON:  02/27/2022. FINDINGS: Brain: No acute intracranial hemorrhage or midline shift. There is a hyperdense mass compatible with known planum sphenoidal meningioma, now measuring 2.4 x 3.3 x 2.8 cm, unchanged from the previous exam. No extra-axial fluid collection. A stable hypodense region is noted in the frontal lobe on the left medially adjacent to the mass, possible old infarct or edema. No hydrocephalus. Vascular: Atherosclerotic calcification of the carotid siphons. No hyperdense vessel. Skull: Normal. Negative for fracture or focal lesion. Sinuses/Orbits: No acute finding.  Other: None. IMPRESSION: 1. No acute intracranial hemorrhage or other acute process. 2. Stable planum sphenoidal meningioma measuring 2.4 x 3.3 x 2.8 cm. Hypodense region is seen in the frontal lobe on the left medially adjacent to the mass, possibly representing edema or old infarct and unchanged from the prior exam. Electronically Signed   By: Thornell Sartorius M.D.   On: 03/15/2022 03:31  PERFORMANCE STATUS (ECOG) : 4 - Bedbound  Review of Systems Unable to complete  Physical Exam General: Frail appearing HEENT: Core track tube Pulmonary: Unlabored Extremities: no edema, no joint deformities Skin: no rashes Neurological: Poorly responsive  IMPRESSION: Patient seen for follow-up.  Case discussed with RN and family at bedside.  Patient remains minimally responsive.  I met with patient's daughter.  Of note, both patient's daughters are RNs.  Daughter says that family remains committed to the ongoing scope of treatment.  They want to wait a total of 21 days postop prior to making any decisions including CODE STATUS.  PLAN: -Continue current scope of treatment -We will follow   Time Total: 15 minutes  Visit consisted of counseling and education dealing with the complex and emotionally intense issues of symptom management and palliative care in the setting of serious and potentially life-threatening illness.Greater than 50%  of this time was spent counseling and coordinating care related to the above assessment and plan.  Signed by: Altha Harm, PhD, NP-C

## 2022-04-12 NOTE — Progress Notes (Signed)
Upon starting second unit of blood bag was accidentally punctured when spiked bag was already scanned. Called blood bank to obtain new unit of blood. New unit obtained but unable to scan unit as unit was already released.   Second verified with Prentice Docker Transfusing began at 1320 Red cells  [id number] B-Pos  Exp 05/06/22

## 2022-04-12 NOTE — Progress Notes (Signed)
Neurosurgery Service Progress Note  Subjective: No acute events overnight   Objective: Vitals:   04/12/22 0804 04/12/22 0934 04/12/22 0937 04/12/22 0956  BP:  (!) 92/52  (!) 100/53  Pulse:  91  93  Resp:  (!) 22 (!) 21 (!) 22  Temp:  99.9 F (37.7 C)  99.5 F (37.5 C)  TempSrc:  Axillary  Axillary  SpO2: 92% 97% 98% 95%  Weight:      Height:        Physical Exam: Eyes closed but when opened makes eye contact and tracks - more of an eyelid apraxia than somnolence, grunts to questions, MAEx4 to pain, overall appears stable  Assessment & Plan: 76 y.o. woman s/p crani for tumor resection, neurologically stable.  -saw her Hb today, thankfully can't have enough intracranial bleeding to cause a decrement in Hb, so no need for repeat CT with a stable exam  Tara [name]  04/12/22 10:04 AM

## 2022-04-12 NOTE — Progress Notes (Signed)
Tube feeding and Coreg held today per Dr. Horris Latino verbal order this morning.

## 2022-04-12 NOTE — Progress Notes (Signed)
Hemoglobin=6.5 MD paged awaiting reply.

## 2022-04-12 NOTE — Progress Notes (Signed)
No reply from Md at this time regarding hemoglobin result. Will notify in coming nurse.

## 2022-04-12 NOTE — Progress Notes (Signed)
A consult was placed to IV Therapy for more peripheral access; pt is currently getting a unit of blood; she also has K runs ordered and other iv medications;  Limited to Left arm for all ivs, and labs;  no suitable veins noted in the left arm; suggest central access;  RN aware.  Husband at bedside.

## 2022-04-12 NOTE — Progress Notes (Signed)
PROGRESS NOTE    Tara [name]  ZOX:[id number] DOB: May 01, 1946 DOA: 03/15/2022 PCP: [name], MD   Brief Narrative:  Patient is a 76 year old African-American female currently with past medical history significant for but not limited to anxiety, depression, diabetes mellitus type 2, hypertension, hyperlipidemia, cardiomyopathy, history of atrial fibrillation on anticoagulation with Eliquis, chronic kidney disease stage IIIb, history of breast cancer status post chemoradiation in 2008 as well as other comorbidities who presented to the hospital on 03/15/2022 with new onset seizures.  She had a known sphenoidal meningioma that was noted to be increased in size on the CT scan with surrounding edema so she is transferred from [facility] for further evaluation.  She underwent LTM with no further seizures and was evaluated by neurosurgery and underwent bifrontal craniotomy for meningioma resection on 03/24/2020 and was transferred to the intensive care unit postoperatively with PCCM consulting while she was in ICU.  Current clinical course and significant Hospital events include the following but not limited to:   Significant Hospital Events: Including procedures, antibiotic start and stop dates in addition to other pertinent events   6/24 presented to [facility] ED with new onset seizures, CTH with enlarging meningioma with edema, loaded with Keppra 7/3 Underwent bifrontal craniotomy for meningioma, monitor in ICU 7/5 stable, no further seizure activity 7/6 less responsive, two episodes of vomiting overnight and increased oral secretions today.  Continuous blinking on exam. CTH yesterday with new 7 mm thick subdural hematoma at the foramen magnum and tracking adjacent to the basion and odontoid not currently causing critical stenosis of the foramen magnum per radiology read, but merits surveillance. 7/7 Better night and more awake and responsive, repeat head CT unchanged and no seizures on video EEG 7/9  Recurrent seizure overnight requiring '4mg'$  of IV ativan to break. This am less responsive with forced right gaze  7/10 no further seizure activity, awake, responding, no forced gaze 7/11 recurrent sz like activity. Not correlated on EEG. CT unchanged.     Assessment and Plan:  Planum Sphenoidal meningioma, enlarging with new vasogenic edema and new onset seizures History is consistent with tonic-clonic seizure with no previous history of seizures Neurosurgery is following and she is status post bifrontal craniotomy for meningioma resection Continue Levetiracetam 1000 mg per Tube BID and Lacosamide 200 mg per Tube BID per neurology recommendations Seizure precautions with PRN benzodiazepines Repeat CT head unremarkable Patient is 2 mg of IV lorazepam every 10 minutes as needed Continue PT/OT evaluation and they are recommend SNF  Fever in setting of questionable new pneumonia and possible aspiration Acute hypoxic respiratory failure- On 8L of HFNC Afebrile X 48h BC x2 NGTD UA unremarkable for infection, UC showing multiple species Chest x-ray done and showed "New hazy opacities projecting over both upper lobes in the suprahilar regions could reflect developing infection in the correct clinical setting. Consider repeat upright PA and lateral radiographs for better evaluation as indicated" Switch to IV Unasyn Gave 1 dose of IV Lasix, DuoNebs, nebulizers PCCM reconsulted, fever aspiration pneumonia, continue to hold tube feeds Continue supplemental oxygen as needed   ABLA Iron deficiency anemia Acute on chronic thrombocytopenia Hemoglobin dropped to 6.5 Type and screen Transfuse 2 units of PRBC Anemia panel iron 15, sats 8, ferritin 440, vitamin B12-->356 Continue monitor for signs and symptoms bleeding; no overt bleeding noted Daily CBC  Hypokalemia Replace as needed   Anxiety and Depression Continue with sertraline 25 mg per tube daily    HTN 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 per tube daily, and Rosuvastatin 20 mg per tube daily   Type 2 DM Continue SSI, Accu-Cheks, hypoglycemic protocol   GERD/GI prophylaxis Continue pantoprazole 40 mg per tube daily   Dysphagia Hold tube feeds for now On Glucerna 1.5 at 1000 mL per tube at 40 MLS per hour every 24 hours as well as Prosource tube feeding liquid 45 MLS per tube twice daily  Nutrition Status: Nutrition Problem: Inadequate oral intake Etiology: inability to eat Signs/Symptoms: NPO status Interventions: Tube feeding, MVI SLP evaluated and recommending n.p.o.  Obesity Lifestyle modification advised -Estimated body mass index is 38.36 kg/m as calculated from the following:   Height as of this encounter: '4\' 9"'$  (1.448 m).   Weight as of this encounter: 80.4 kg.   GOC discussion Due to multiple comorbidities, minimally responsive state, overall poor prognosis Palliative care consulted for further goals of care discussion     DVT prophylaxis: SCDs Start: 03/26/22 0840 Place TED hose Start: 03/24/22 0616 Place and maintain sequential compression device Start: 03/23/22 0631 apixaban (ELIQUIS) tablet 5 mg    Code Status: Full Code Family Communication: Discussed with her husband at bedside  Disposition Plan:  Level of care: Progressive Status is: Inpatient Remains inpatient appropriate because: Minimally responsive and will need SNF once stable for discharge   Consultants:  PCCM Neurosurgery Neurology Palliative  Procedures:  See as above  Antimicrobials:  Anti-infectives (From admission, onward)    Start     Dose/Rate Route Frequency Ordered Stop   04/12/22 1500  Ampicillin-Sulbactam  (UNASYN) 3 g in sodium chloride 0.9 % 100 mL IVPB        3 g 200 mL/hr over 30 Minutes Intravenous Every 8 hours 04/12/22 1409     04/11/22 1845  Ampicillin-Sulbactam (UNASYN) 3 g in sodium chloride 0.9 % 100 mL IVPB  Status:  Discontinued        3 g 200 mL/hr over 30 Minutes Intravenous Every 8 hours 04/11/22 1754 04/12/22 1409   04/10/22 1200  vancomycin (VANCOREADY) IVPB 750 mg/150 mL  Status:  Discontinued        750 mg 150 mL/hr over 60 Minutes Intravenous Every 24 hours 04/09/22 1034 04/11/22 1100   04/10/22 0900  metroNIDAZOLE (FLAGYL) IVPB 500 mg  Status:  Discontinued        500 mg 100 mL/hr over 60 Minutes Intravenous Every 12 hours 04/10/22 0754 04/11/22 1754   04/09/22 2300  ceFEPIme (MAXIPIME) 2 g in sodium chloride 0.9 % 100 mL IVPB  Status:  Discontinued        2 g 200 mL/hr over 30 Minutes Intravenous Every 12 hours 04/09/22 1035 04/11/22 1717   04/09/22 1045  vancomycin (VANCOREADY) IVPB 1750 mg/350 mL        1,750 mg 175 mL/hr over 120 Minutes Intravenous  Once 04/09/22 0953 04/09/22 1344   04/09/22 1045  ceFEPIme (MAXIPIME) 2 g in sodium chloride 0.9 % 100 mL IVPB        2 g 200 mL/hr over 30 Minutes Intravenous  Once 04/09/22 0953 04/09/22 1524   03/27/22 0945  Ampicillin-Sulbactam (UNASYN) 3 g in sodium chloride 0.9 % 100 mL IVPB        3 g 200  mL/hr over 30 Minutes Intravenous Every 6 hours 03/27/22 0859 04/01/22 0518   03/25/22 0600  ceFAZolin (ANCEF) IVPB 2g/100 mL premix        2 g 200 mL/hr over 30 Minutes Intravenous On call to O.R. 03/24/22 1215 03/24/22 1440       Subjective: Patient seen and examined at bedside, continues to be unresponsive.  Noted to have increased work of breathing and requiring more oxygen/hypoxic   Objective: Vitals:   04/12/22 1319 04/12/22 1333 04/12/22 1448 04/12/22 1558  BP: (!) 107/58 (!) 90/56  125/65  Pulse: 83 81 85 85  Resp: 19 20 (!) 21 (!) 21  Temp: 98.7 F (37.1 C) 98.4 F (36.9 C)  98 F (36.7 C)  TempSrc:  Axillary Axillary  Axillary  SpO2: 96% 96% 95% 94%  Weight:      Height:        Intake/Output Summary (Last 24 hours) at 04/12/2022 1857 Last data filed at 04/12/2022 1823 Gross per 24 hour  Intake 1685.29 ml  Output 1000 ml  Net 685.29 ml   Filed Weights   04/10/22 0432 04/11/22 0500 04/12/22 0434  Weight: 77.7 kg 81.1 kg 80.4 kg   Examination: Physical Exam: General: NAD, minimally responsive, noted large scar across forehead Cardiovascular: S1, S2 present Respiratory: Rhonchi noted bilaterally Abdomen: Soft, nontender, nondistended, bowel sounds present Musculoskeletal: No bilateral pedal edema noted Skin: Normal Psychiatry: Unable to assess Neurology: Unable to follow commands   Data Reviewed: I have personally reviewed following labs and imaging studies  CBC: Recent Labs  Lab 04/08/22 1008 04/09/22 0221 04/10/22 0422 04/11/22 0635 04/12/22 0422  WBC 7.2 6.2 5.2 6.1 5.6  NEUTROABS 6.6 5.7 4.8 5.5 4.9  HGB 7.4* 7.5* 7.2* 7.1* 6.5*  HCT 22.6* 23.2* 22.6* 21.6* 19.8*  MCV 87.6 89.2 89.3 85.7 85.7  PLT 85* 85* 96* 112* 027*   Basic Metabolic Panel: Recent Labs  Lab 04/07/22 0915 04/08/22 1008 04/09/22 0221 04/10/22 0422 04/11/22 0635 04/12/22 0422  NA 136 136 135 134* 135 140  K 3.8 3.5 4.0 3.8 3.9 3.0*  CL 103 103 101 103 104 105  CO2 25 24 21* 23 21* 25  GLUCOSE 126* 147* 115* 142* 161* 132*  BUN 32* 26* 26* 29* 35* 40*  CREATININE 0.88 0.83 0.75 0.78 0.82 0.95  CALCIUM 8.1* 8.1* 8.1* 8.1* 8.1* 8.2*  MG 2.1 2.1 2.0  --   --  2.2  PHOS 3.4 2.5 2.8  --   --   --    GFR: Estimated Creatinine Clearance: 44 mL/min (by C-G formula based on SCr of 0.95 mg/dL). Liver Function Tests: Recent Labs  Lab 04/07/22 0915 04/08/22 1008 04/09/22 0221  AST '24 24 26  '$ ALT 36 36 36  ALKPHOS 87 74 68  BILITOT 0.5 0.6 0.5  PROT 5.6* 5.5* 5.3*  ALBUMIN 2.3* 2.1* 2.0*   No results for input(s): "LIPASE", "AMYLASE" in the last 168 hours. No results for input(s):  "AMMONIA" in the last 168 hours. Coagulation Profile: No results for input(s): "INR", "PROTIME" in the last 168 hours. Cardiac Enzymes: No results for input(s): "CKTOTAL", "CKMB", "CKMBINDEX", "TROPONINI" in the last 168 hours. BNP (last 3 results) No results for input(s): "PROBNP" in the last 8760 hours. HbA1C: No results for input(s): "HGBA1C" in the last 72 hours. CBG: Recent Labs  Lab 04/12/22 0358 04/12/22 0745 04/12/22 0832 04/12/22 1159 04/12/22 1602  GLUCAP 144* 65* 110* 108* 105*   Lipid Profile: No results for input(s): "CHOL", "  HDL", "LDLCALC", "TRIG", "CHOLHDL", "LDLDIRECT" in the last 72 hours. Thyroid Function Tests: No results for input(s): "TSH", "T4TOTAL", "FREET4", "T3FREE", "THYROIDAB" in the last 72 hours. Anemia Panel: Recent Labs    04/11/22 0422  VITAMINB12 356  FOLATE 9.5  FERRITIN 440*  TIBC 193*  IRON 15*   Sepsis Labs: No results for input(s): "PROCALCITON", "LATICACIDVEN" in the last 168 hours.  Recent Results (from the past 240 hour(s))  Urine Culture     Status: Abnormal   Collection Time: 04/08/22  8:40 AM   Specimen: Urine, Clean Catch  Result Value Ref Range Status   Specimen Description URINE, CLEAN CATCH  Final   Special Requests   Final    NONE Performed at [facility], [address] [place], [place] [address]    Culture MULTIPLE SPECIES PRESENT, SUGGEST RECOLLECTION (A)  Final   Report Status 04/09/2022 FINAL  Final  Culture, blood (Routine X 2) w Reflex to ID Panel     Status: None (Preliminary result)   Collection Time: 04/08/22 10:08 AM   Specimen: BLOOD  Result Value Ref Range Status   Specimen Description BLOOD LEFT ANTECUBITAL  Final   Special Requests   Final    BOTTLES DRAWN AEROBIC AND ANAEROBIC Blood Culture adequate volume   Culture   Final    NO GROWTH 4 DAYS Performed at [facility], [place] [address] [place], [place] [address]    Report Status PENDING  Incomplete  Culture, blood (Routine X 2)  w Reflex to ID Panel     Status: None (Preliminary result)   Collection Time: 04/08/22 10:12 AM   Specimen: BLOOD LEFT FOREARM  Result Value Ref Range Status   Specimen Description BLOOD LEFT FOREARM  Final   Special Requests   Final    BOTTLES DRAWN AEROBIC AND ANAEROBIC Blood Culture adequate volume   Culture   Final    NO GROWTH 4 DAYS Performed at [facility], [place] [address], [place], [place] [address]    Report Status PENDING  Incomplete  MRSA Next Gen by PCR, Nasal     Status: None   Collection Time: 04/10/22 11:58 AM   Specimen: Nasal Mucosa; Nasal Swab  Result Value Ref Range Status   MRSA by PCR Next Gen NOT DETECTED NOT DETECTED Final    Comment: (NOTE) The GeneXpert MRSA Assay (FDA approved for NASAL specimens only), is one component of a comprehensive MRSA colonization surveillance program. It is not intended to diagnose MRSA infection nor to guide or monitor treatment for MRSA infections. Test performance is not FDA approved in patients less than 42 years old. Performed at [facility], [place] [address], [place], [place] [address]      Radiology Studies: DG CHEST PORT 1 VIEW  Result Date: 04/11/2022 CLINICAL DATA:  Dyspnea EXAM: PORTABLE CHEST 1 VIEW COMPARISON:  Radiographs 04/09/2022 FINDINGS: Similar to slightly increased upper lung predominant airspace opacities bilaterally. Remainder unchanged. Calcified right hilar nodes. Enlarged cardiomediastinal silhouette. Subdiaphragmatic enteric tube. Right axillary and right upper quadrant surgical clips. IMPRESSION: Similar to slightly increased upper lung predominant airspace opacities bilaterally. Electronically Signed   By: Placido Sou M.D.   On: 04/11/2022 09:42     Scheduled Meds:  amiodarone  200 mg Per Tube Daily   apixaban  5 mg Per Tube BID   budesonide (PULMICORT) nebulizer solution  0.25 mg Nebulization BID   carvedilol  6.25 mg Per Tube BID WC   Chlorhexidine  Gluconate Cloth  6 each Topical  Daily   feeding supplement (GLUCERNA 1.5 CAL)  1,000 mL Per Tube Q24H   feeding supplement (PROSource TF)  45 mL Per Tube BID   free water  200 mL Per Tube Q6H   insulin aspart  0-15 Units Subcutaneous Q4H   insulin glargine-yfgn  10 Units Subcutaneous Daily   ipratropium-albuterol  3 mL Nebulization Q6H   lacosamide  200 mg Per Tube BID   levETIRAcetam  1,000 mg Per Tube BID   montelukast  10 mg Per Tube QHS   multivitamin with minerals  1 tablet Per Tube Daily   mouth rinse  15 mL Mouth Rinse 4 times per day   pantoprazole sodium  40 mg Per Tube Daily   polyethylene glycol  17 g Per NG tube Daily   rosuvastatin  20 mg Per Tube Daily   senna  1 tablet Per Tube BID   sertraline  25 mg Per Tube Daily   Continuous Infusions:  ampicillin-sulbactam (UNASYN) IV 3 g (04/12/22 1605)   potassium chloride 10 mEq (04/12/22 1812)     LOS: 28 days   Alma Friendly, MD [facility] Available via Epic secure chat 7am-7pm After these hours, please refer to coverage provider listed on [email or web address] 04/12/2022, 6:57 PM

## 2022-04-12 NOTE — Progress Notes (Signed)
Hypoglycemic Event  CBG: 65  Treatment: D50 25 mL (12.5 gm)  Symptoms: None  Follow-up CBG: [id number] CBG Result:110  Possible Reasons for Event: Unknown     Renee Ramus

## 2022-04-13 ENCOUNTER — Inpatient Hospital Stay (HOSPITAL_COMMUNITY): Payer: Medicare Other

## 2022-04-13 DIAGNOSIS — D696 Thrombocytopenia, unspecified: Secondary | ICD-10-CM | POA: Diagnosis not present

## 2022-04-13 DIAGNOSIS — R569 Unspecified convulsions: Secondary | ICD-10-CM | POA: Diagnosis not present

## 2022-04-13 DIAGNOSIS — I482 Chronic atrial fibrillation, unspecified: Secondary | ICD-10-CM | POA: Diagnosis not present

## 2022-04-13 DIAGNOSIS — D320 Benign neoplasm of cerebral meninges: Secondary | ICD-10-CM | POA: Diagnosis not present

## 2022-04-13 LAB — CULTURE, BLOOD (ROUTINE X 2)
Culture: NO GROWTH
Culture: NO GROWTH
Special Requests: ADEQUATE
Special Requests: ADEQUATE

## 2022-04-13 LAB — CBC WITH DIFFERENTIAL/PLATELET
Abs Immature Granulocytes: 0.08 10*3/uL — ABNORMAL HIGH (ref 0.00–0.07)
Basophils Absolute: 0 10*3/uL (ref 0.0–0.1)
Basophils Relative: 0 %
Eosinophils Absolute: 0.1 10*3/uL (ref 0.0–0.5)
Eosinophils Relative: 1 %
HCT: 32.3 % — ABNORMAL LOW (ref 36.0–46.0)
Hemoglobin: 10.6 g/dL — ABNORMAL LOW (ref 12.0–15.0)
Immature Granulocytes: 1 %
Lymphocytes Relative: 7 %
Lymphs Abs: 0.4 10*3/uL — ABNORMAL LOW (ref 0.7–4.0)
MCH: 27.3 pg (ref 26.0–34.0)
MCHC: 32.8 g/dL (ref 30.0–36.0)
MCV: 83.2 fL (ref 80.0–100.0)
Monocytes Absolute: 0.3 10*3/uL (ref 0.1–1.0)
Monocytes Relative: 5 %
Neutro Abs: 5 10*3/uL (ref 1.7–7.7)
Neutrophils Relative %: 86 %
Platelets: 135 10*3/uL — ABNORMAL LOW (ref 150–400)
RBC: 3.88 MIL/uL (ref 3.87–5.11)
RDW: 18.2 % — ABNORMAL HIGH (ref 11.5–15.5)
WBC: 5.9 10*3/uL (ref 4.0–10.5)
nRBC: 0.5 % — ABNORMAL HIGH (ref 0.0–0.2)

## 2022-04-13 LAB — GLUCOSE, CAPILLARY
Glucose-Capillary: 102 mg/dL — ABNORMAL HIGH (ref 70–99)
Glucose-Capillary: 99 mg/dL (ref 70–99)
Glucose-Capillary: 118 mg/dL — ABNORMAL HIGH (ref 70–99)
Glucose-Capillary: 100 mg/dL — ABNORMAL HIGH (ref 70–99)
Glucose-Capillary: 98 mg/dL (ref 70–99)
Glucose-Capillary: 119 mg/dL — ABNORMAL HIGH (ref 70–99)

## 2022-04-13 LAB — BASIC METABOLIC PANEL
Anion gap: 10 (ref 5–15)
BUN: 31 mg/dL — ABNORMAL HIGH (ref 8–23)
CO2: 24 mmol/L (ref 22–32)
Calcium: 8.4 mg/dL — ABNORMAL LOW (ref 8.9–10.3)
Chloride: 110 mmol/L (ref 98–111)
Creatinine, Ser: 0.9 mg/dL (ref 0.44–1.00)
GFR, Estimated: >60 mL/min (ref >60)
Glucose, Bld: 108 mg/dL — ABNORMAL HIGH (ref 70–99)
Potassium: 3.8 mmol/L (ref 3.5–5.1)
Sodium: 144 mmol/L (ref 135–145)

## 2022-04-13 MED ORDER — IOHEXOL 350 MG/ML SOLN
100.0000 mL | Freq: Once | INTRAVENOUS | Status: AC | PRN
  Administered 2022-04-13: 100 mL via INTRAVENOUS

## 2022-04-13 MED ORDER — IPRATROPIUM-ALBUTEROL 0.5-2.5 (3) MG/3ML IN SOLN
3.0000 mL | Freq: Four times a day (QID) | RESPIRATORY_TRACT | Status: DC
  Administered 2022-04-13 – 2022-04-14 (×3): 3 mL via RESPIRATORY_TRACT
  Filled 2022-04-13 (×4): qty 3

## 2022-04-13 NOTE — Progress Notes (Signed)
Small amount of bleeding noted from right nares earlier this shift. No further episode of bleeding noted at this time will continue to monitor. No visible sign of distress noted.

## 2022-04-13 NOTE — Progress Notes (Signed)
PROGRESS NOTE    Tara [name]  DZH:[id number] DOB: 06-04-1946 DOA: 03/15/2022 PCP: [name], MD   Brief Narrative:  Patient is a 76 year old African-American female currently with past medical history significant for but not limited to anxiety, depression, diabetes mellitus type 2, hypertension, hyperlipidemia, cardiomyopathy, history of atrial fibrillation on anticoagulation with Eliquis, chronic kidney disease stage IIIb, history of breast cancer status post chemoradiation in 2008 as well as other comorbidities who presented to the hospital on 03/15/2022 with new onset seizures.  She had a known sphenoidal meningioma that was noted to be increased in size on the CT scan with surrounding edema so she is transferred from [facility] for further evaluation.  She underwent LTM with no further seizures and was evaluated by neurosurgery and underwent bifrontal craniotomy for meningioma resection on 03/24/2020 and was transferred to the intensive care unit postoperatively with PCCM consulting while she was in ICU.  Current clinical course and significant Hospital events include the following but not limited to:    Significant Hospital Events: Including procedures, antibiotic start and stop dates in addition to other pertinent events   6/24 presented to [facility] ED with new onset seizures, CTH with enlarging meningioma with edema, loaded with Keppra 7/3 Underwent bifrontal craniotomy for meningioma, monitor in ICU 7/5 stable, no further seizure activity 7/6 less responsive, two episodes of vomiting overnight and increased oral secretions today.  Continuous blinking on exam. CTH yesterday with new 7 mm thick subdural hematoma at the foramen magnum and tracking adjacent to the basion and odontoid not currently causing critical stenosis of the foramen magnum per radiology read, but merits surveillance. 7/7 Better night and more awake and responsive, repeat head CT unchanged and no seizures on video  EEG 7/9 Recurrent seizure overnight requiring '4mg'$  of IV ativan to break. This am less responsive with forced right gaze  7/10 no further seizure activity, awake, responding, no forced gaze 7/11 recurrent sz like activity. Not correlated on EEG. CT unchanged.     Assessment and Plan:  [place] Sphenoidal meningioma, enlarging with new vasogenic edema and new onset seizures History is consistent with tonic-clonic seizure with no previous history of seizures Neurosurgery is following and she is status post bifrontal craniotomy for meningioma resection Continue Levetiracetam 1000 mg per Tube BID and Lacosamide 200 mg per Tube BID per neurology recommendations Seizure precautions with PRN benzodiazepines Repeat CT head unremarkable Patient is 2 mg of IV lorazepam every 10 minutes as needed Continue PT/OT evaluation and they are recommend SNF  Sepsis in setting of HCAP Vs aspiration PNA Acute hypoxic respiratory failure- On 8L of HFNC Febrile on 7/23 on 100.5 BC x2 NGTD, repeat pending  UA unremarkable for infection, UC showing multiple species Chest x-ray done and showed "New hazy opacities projecting over both upper lobes in the suprahilar regions could reflect developing infection in the correct clinical setting CT chest, abdomen/pelvis pending for further evaluation PCCM rec switching to IV Unasyn Gave 1 dose of IV Lasix, continue DuoNebs, nebulizers PCCM re-consulted, appreciate recs Continue to hold tube feeds Continue supplemental oxygen as needed   ABLA Iron deficiency anemia Acute on chronic thrombocytopenia Hemoglobin dropped to 6.5 on 7/22 Type and screen Transfused 2 units of PRBC on 7/22 Anemia panel iron 15, sats 8, ferritin 440, vitamin B12-->356 Continue monitor for signs and symptoms bleeding; no overt bleeding noted Daily CBC  Hypokalemia Replace as needed   Anxiety and Depression Continue with sertraline 25 mg per tube daily  HTN Chronic systolic CHF/heart  failure with preserved ejection fraction Paroxysmal Atrial Fibrillation Hyperlipidemia Echo 6/9 with EF 55% and Grade I diastolic dysfunction; previously had a normal diastolic function Hold Apixaban 5 mg per tube given drop in hemoglobin Continue Carvedilol 6.25 mg per tube twice daily, Amiodarone 200 mg p.o. per tube daily, and Rosuvastatin 20 mg per tube daily   Type 2 DM Continue SSI, Accu-Cheks, hypoglycemic protocol   GERD/GI prophylaxis Continue pantoprazole 40 mg per tube daily   Dysphagia Hold tube feeds for now On Glucerna 1.5 at 1000 mL per tube at 40 MLS per hour every 24 hours as well as Prosource tube feeding liquid 45 MLS per tube twice daily  Nutrition Status: Nutrition Problem: Inadequate oral intake Etiology: inability to eat Signs/Symptoms: NPO status Interventions: Tube feeding, MVI SLP evaluated and recommending n.p.o.  Obesity Lifestyle modification advised -Estimated body mass index is 37.69 kg/m as calculated from the following:   Height as of this encounter: '4\' 9"'$  (1.448 m).   Weight as of this encounter: 79 kg.   GOC discussion Due to multiple comorbidities, minimally responsive state, overall poor prognosis Palliative care consulted for further goals of care discussion     DVT prophylaxis: SCDs Start: 03/26/22 0840 Place TED hose Start: 03/24/22 0616 Place and maintain sequential compression device Start: 03/23/22 0631    Code Status: Full Code Family Communication: Discussed with her husband at bedside  Disposition Plan:  Level of care: Progressive Status is: Inpatient Remains inpatient appropriate because: Minimally responsive and will need SNF once stable for discharge   Consultants:  PCCM Neurosurgery Neurology Palliative  Procedures:  See as above  Antimicrobials:  Anti-infectives (From admission, onward)    Start     Dose/Rate Route Frequency Ordered Stop   04/12/22 1500  Ampicillin-Sulbactam (UNASYN) 3 g in sodium  chloride 0.9 % 100 mL IVPB        3 g 200 mL/hr over 30 Minutes Intravenous Every 8 hours 04/12/22 1409     04/11/22 1845  Ampicillin-Sulbactam (UNASYN) 3 g in sodium chloride 0.9 % 100 mL IVPB  Status:  Discontinued        3 g 200 mL/hr over 30 Minutes Intravenous Every 8 hours 04/11/22 1754 04/12/22 1409   04/10/22 1200  vancomycin (VANCOREADY) IVPB 750 mg/150 mL  Status:  Discontinued        750 mg 150 mL/hr over 60 Minutes Intravenous Every 24 hours 04/09/22 1034 04/11/22 1100   04/10/22 0900  metroNIDAZOLE (FLAGYL) IVPB 500 mg  Status:  Discontinued        500 mg 100 mL/hr over 60 Minutes Intravenous Every 12 hours 04/10/22 0754 04/11/22 1754   04/09/22 2300  ceFEPIme (MAXIPIME) 2 g in sodium chloride 0.9 % 100 mL IVPB  Status:  Discontinued        2 g 200 mL/hr over 30 Minutes Intravenous Every 12 hours 04/09/22 1035 04/11/22 1717   04/09/22 1045  vancomycin (VANCOREADY) IVPB 1750 mg/350 mL        1,750 mg 175 mL/hr over 120 Minutes Intravenous  Once 04/09/22 0953 04/09/22 1344   04/09/22 1045  ceFEPIme (MAXIPIME) 2 g in sodium chloride 0.9 % 100 mL IVPB        2 g 200 mL/hr over 30 Minutes Intravenous  Once 04/09/22 0953 04/09/22 1524   03/27/22 0945  Ampicillin-Sulbactam (UNASYN) 3 g in sodium chloride 0.9 % 100 mL IVPB        3 g 200 mL/hr  over 30 Minutes Intravenous Every 6 hours 03/27/22 0859 04/01/22 0518   03/25/22 0600  ceFAZolin (ANCEF) IVPB 2g/100 mL premix        2 g 200 mL/hr over 30 Minutes Intravenous On call to O.R. 03/24/22 1215 03/24/22 1440       Subjective: Patient seen and examined at bedside.  Still unresponsive   Objective: Vitals:   04/13/22 0738 04/13/22 0819 04/13/22 1138 04/13/22 1616  BP: 101/62  (!) 95/57 109/69  Pulse: 100 97 87 90  Resp: (!) 22 20 (!) 21 (!) 26  Temp: (!) 100.5 F (38.1 C)  97.7 F (36.5 C) 98.8 F (37.1 C)  TempSrc: Axillary  Oral Oral  SpO2: 91% 93% 92% 93%  Weight:      Height:        Intake/Output Summary (Last  24 hours) at 04/13/2022 1644 Last data filed at 04/13/2022 0305 Gross per 24 hour  Intake 260.62 ml  Output 350 ml  Net -89.38 ml   Filed Weights   04/12/22 0434 04/12/22 1901 04/13/22 0500  Weight: 80.4 kg 76.4 kg 79 kg   Examination: Physical Exam: General: NAD, minimally responsive, noted scar across forehead Cardiovascular: S1, S2 present Respiratory: Rhonchi noted bilaterally Abdomen: Soft, nontender, nondistended, bowel sounds present Musculoskeletal: No bilateral pedal edema noted Skin: Normal Psychiatry: Unable to assess Neurology: Unable to follow commands   Data Reviewed: I have personally reviewed following labs and imaging studies  CBC: Recent Labs  Lab 04/10/22 0422 04/11/22 0635 04/12/22 0422 04/12/22 1801 04/13/22 0212  WBC 5.2 6.1 5.6 6.4 5.9  NEUTROABS 4.8 5.5 4.9 5.4 5.0  HGB 7.2* 7.1* 6.5* 10.5* 10.6*  HCT 22.6* 21.6* 19.8* 30.8* 32.3*  MCV 89.3 85.7 85.7 82.4 83.2  PLT 96* 112* 127* 113* 948*   Basic Metabolic Panel: Recent Labs  Lab 04/07/22 0915 04/08/22 1008 04/09/22 0221 04/10/22 0422 04/11/22 0635 04/12/22 0422 04/13/22 0212  NA 136 136 135 134* 135 140 144  K 3.8 3.5 4.0 3.8 3.9 3.0* 3.8  CL 103 103 101 103 104 105 110  CO2 25 24 21* 23 21* 25 24  GLUCOSE 126* 147* 115* 142* 161* 132* 108*  BUN 32* 26* 26* 29* 35* 40* 31*  CREATININE 0.88 0.83 0.75 0.78 0.82 0.95 0.90  CALCIUM 8.1* 8.1* 8.1* 8.1* 8.1* 8.2* 8.4*  MG 2.1 2.1 2.0  --   --  2.2  --   PHOS 3.4 2.5 2.8  --   --   --   --    GFR: Estimated Creatinine Clearance: 46 mL/min (by C-G formula based on SCr of 0.9 mg/dL). Liver Function Tests: Recent Labs  Lab 04/07/22 0915 04/08/22 1008 04/09/22 0221  AST '24 24 26  '$ ALT 36 36 36  ALKPHOS 87 74 68  BILITOT 0.5 0.6 0.5  PROT 5.6* 5.5* 5.3*  ALBUMIN 2.3* 2.1* 2.0*   No results for input(s): "LIPASE", "AMYLASE" in the last 168 hours. No results for input(s): "AMMONIA" in the last 168 hours. Coagulation Profile: No  results for input(s): "INR", "PROTIME" in the last 168 hours. Cardiac Enzymes: No results for input(s): "CKTOTAL", "CKMB", "CKMBINDEX", "TROPONINI" in the last 168 hours. BNP (last 3 results) No results for input(s): "PROBNP" in the last 8760 hours. HbA1C: No results for input(s): "HGBA1C" in the last 72 hours. CBG: Recent Labs  Lab 04/12/22 2305 04/13/22 0306 04/13/22 0800 04/13/22 1232 04/13/22 1614  GLUCAP 107* 102* 99 118* 100*   Lipid Profile: No results for  input(s): "CHOL", "HDL", "LDLCALC", "TRIG", "CHOLHDL", "LDLDIRECT" in the last 72 hours. Thyroid Function Tests: No results for input(s): "TSH", "T4TOTAL", "FREET4", "T3FREE", "THYROIDAB" in the last 72 hours. Anemia Panel: Recent Labs    04/11/22 0422  VITAMINB12 356  FOLATE 9.5  FERRITIN 440*  TIBC 193*  IRON 15*   Sepsis Labs: No results for input(s): "PROCALCITON", "LATICACIDVEN" in the last 168 hours.  Recent Results (from the past 240 hour(s))  Urine Culture     Status: Abnormal   Collection Time: 04/08/22  8:40 AM   Specimen: Urine, Clean Catch  Result Value Ref Range Status   Specimen Description URINE, CLEAN CATCH  Final   Special Requests   Final    NONE Performed at [facility], [address], [place], [place] [address]    Culture MULTIPLE SPECIES PRESENT, SUGGEST RECOLLECTION (A)  Final   Report Status 04/09/2022 FINAL  Final  Culture, blood (Routine X 2) w Reflex to ID Panel     Status: None   Collection Time: 04/08/22 10:08 AM   Specimen: BLOOD  Result Value Ref Range Status   Specimen Description BLOOD LEFT ANTECUBITAL  Final   Special Requests   Final    BOTTLES DRAWN AEROBIC AND ANAEROBIC Blood Culture adequate volume   Culture   Final    NO GROWTH 5 DAYS Performed at [facility], [place] [address], [place], [place] [address]    Report Status 04/13/2022 FINAL  Final  Culture, blood (Routine X 2) w Reflex to ID Panel     Status: None   Collection Time: 04/08/22 10:12 AM    Specimen: BLOOD LEFT FOREARM  Result Value Ref Range Status   Specimen Description BLOOD LEFT FOREARM  Final   Special Requests   Final    BOTTLES DRAWN AEROBIC AND ANAEROBIC Blood Culture adequate volume   Culture   Final    NO GROWTH 5 DAYS Performed at [facility], [place] [address], [place], [place] [address]    Report Status 04/13/2022 FINAL  Final  MRSA Next Gen by PCR, Nasal     Status: None   Collection Time: 04/10/22 11:58 AM   Specimen: Nasal Mucosa; Nasal Swab  Result Value Ref Range Status   MRSA by PCR Next Gen NOT DETECTED NOT DETECTED Final    Comment: (NOTE) The GeneXpert MRSA Assay (FDA approved for NASAL specimens only), is one component of a comprehensive MRSA colonization surveillance program. It is not intended to diagnose MRSA infection nor to guide or monitor treatment for MRSA infections. Test performance is not FDA approved in patients less than 4 years old. Performed at [facility], [place] [address] [place],  [address]      Radiology Studies: No results found.   Scheduled Meds:  amiodarone  200 mg Per Tube Daily   budesonide (PULMICORT) nebulizer solution  0.25 mg Nebulization BID   carvedilol  6.25 mg Per Tube BID WC   Chlorhexidine Gluconate Cloth  6 each Topical Daily   feeding supplement (GLUCERNA 1.5 CAL)  1,000 mL Per Tube Q24H   feeding supplement (PROSource TF)  45 mL Per Tube BID   free water  200 mL Per Tube Q6H   insulin aspart  0-15 Units Subcutaneous Q4H   insulin glargine-yfgn  10 Units Subcutaneous Daily   lacosamide  200 mg Per Tube BID   levETIRAcetam  1,000 mg Per Tube BID   montelukast  10 mg Per Tube  QHS   multivitamin with minerals  1 tablet Per Tube Daily   mouth rinse  15 mL Mouth Rinse 4 times per day   pantoprazole sodium  40 mg Per Tube Daily   polyethylene glycol  17 g Per NG tube Daily   rosuvastatin  20 mg Per Tube Daily   senna  1 tablet Per Tube BID   sertraline  25 mg Per Tube Daily    Continuous Infusions:  ampicillin-sulbactam (UNASYN) IV 3 g (04/13/22 1455)     LOS: 29 days   Alma Friendly, MD [facility] Available via Epic secure chat 7am-7pm After these hours, please refer to coverage provider listed on [email or web address] 04/13/2022, 4:44 PM

## 2022-04-13 NOTE — Progress Notes (Signed)
PIV consult: Unsuccessful attempts for 20g PIV by 2 VAST RNs with ultrasound guidance. Discussed venous access options with pt's daughter [name] and Radio broadcast assistant. Daughter reports providers are hesitant to insert PICC due to fevers. Pt may benefit from EJ for CT angio. L upper arm PIV infusing adequately for medications.

## 2022-04-13 NOTE — Progress Notes (Signed)
Neurosurgery Service Progress Note  Subjective: No acute events overnight   Objective: Vitals:   04/13/22 0304 04/13/22 0500 04/13/22 0738 04/13/22 0819  BP: 112/63  101/62   Pulse: (!) 101  100 97  Resp: 20  (!) 22 20  Temp: 99.3 F (37.4 C)  (!) 100.5 F (38.1 C)   TempSrc: Axillary  Axillary   SpO2: 98%  91% 93%  Weight:  79 kg    Height:        Physical Exam: Eyes closed but when opened by examiner she makes eye contact and tracks - more of an eyelid apraxia than somnolence, some grunts to questions, MAEx4 to pain, overall appears stable, incision c/d/i  Assessment & Plan: 76 y.o. woman s/p crani for tumor resection, neurologically stable.  -no change in neurosurgical plan of care, pt asked about speaking with Dr. Christella Noa about prognosis, advised he should be back tomorrow  [name]  04/13/22 10:39 AM

## 2022-04-14 DIAGNOSIS — D696 Thrombocytopenia, unspecified: Secondary | ICD-10-CM | POA: Diagnosis not present

## 2022-04-14 DIAGNOSIS — R569 Unspecified convulsions: Secondary | ICD-10-CM | POA: Diagnosis not present

## 2022-04-14 DIAGNOSIS — J9601 Acute respiratory failure with hypoxia: Secondary | ICD-10-CM | POA: Diagnosis not present

## 2022-04-14 DIAGNOSIS — Z515 Encounter for palliative care: Secondary | ICD-10-CM | POA: Diagnosis not present

## 2022-04-14 DIAGNOSIS — I482 Chronic atrial fibrillation, unspecified: Secondary | ICD-10-CM | POA: Diagnosis not present

## 2022-04-14 DIAGNOSIS — D320 Benign neoplasm of cerebral meninges: Secondary | ICD-10-CM | POA: Diagnosis not present

## 2022-04-14 DIAGNOSIS — T17908D Unspecified foreign body in respiratory tract, part unspecified causing other injury, subsequent encounter: Secondary | ICD-10-CM

## 2022-04-14 LAB — TYPE AND SCREEN
ABO/RH(D): B POS
Antibody Screen: NEGATIVE
Unit division: 0
Unit division: 0
Unit division: 0

## 2022-04-14 LAB — BPAM RBC
Blood Product Expiration Date: 202308082359
Blood Product Expiration Date: 202308082359
Blood Product Expiration Date: 202308152359
ISSUE DATE / TIME: 202307220926
ISSUE DATE / TIME: 202307221230
ISSUE DATE / TIME: 202307221308
Unit Type and Rh: 7300
Unit Type and Rh: 7300
Unit Type and Rh: 7300

## 2022-04-14 LAB — BASIC METABOLIC PANEL
Anion gap: 12 (ref 5–15)
Anion gap: 10 (ref 5–15)
BUN: 28 mg/dL — ABNORMAL HIGH (ref 8–23)
BUN: 27 mg/dL — ABNORMAL HIGH (ref 8–23)
CO2: 22 mmol/L (ref 22–32)
CO2: 22 mmol/L (ref 22–32)
Calcium: 8.3 mg/dL — ABNORMAL LOW (ref 8.9–10.3)
Calcium: 8.3 mg/dL — ABNORMAL LOW (ref 8.9–10.3)
Chloride: 113 mmol/L — ABNORMAL HIGH (ref 98–111)
Chloride: 113 mmol/L — ABNORMAL HIGH (ref 98–111)
Creatinine, Ser: 0.95 mg/dL (ref 0.44–1.00)
Creatinine, Ser: 0.98 mg/dL (ref 0.44–1.00)
GFR, Estimated: >60 mL/min (ref >60)
GFR, Estimated: 60 mL/min — ABNORMAL LOW (ref >60)
Glucose, Bld: 106 mg/dL — ABNORMAL HIGH (ref 70–99)
Glucose, Bld: 87 mg/dL (ref 70–99)
Potassium: 3.4 mmol/L — ABNORMAL LOW (ref 3.5–5.1)
Potassium: 4 mmol/L (ref 3.5–5.1)
Sodium: 147 mmol/L — ABNORMAL HIGH (ref 135–145)
Sodium: 145 mmol/L (ref 135–145)

## 2022-04-14 LAB — CBC WITH DIFFERENTIAL/PLATELET
Abs Immature Granulocytes: 0.12 10*3/uL — ABNORMAL HIGH (ref 0.00–0.07)
Basophils Absolute: 0 10*3/uL (ref 0.0–0.1)
Basophils Relative: 0 %
Eosinophils Absolute: 0.1 10*3/uL (ref 0.0–0.5)
Eosinophils Relative: 1 %
HCT: 31.6 % — ABNORMAL LOW (ref 36.0–46.0)
Hemoglobin: 10.4 g/dL — ABNORMAL LOW (ref 12.0–15.0)
Immature Granulocytes: 2 %
Lymphocytes Relative: 7 %
Lymphs Abs: 0.4 10*3/uL — ABNORMAL LOW (ref 0.7–4.0)
MCH: 27.7 pg (ref 26.0–34.0)
MCHC: 32.9 g/dL (ref 30.0–36.0)
MCV: 84 fL (ref 80.0–100.0)
Monocytes Absolute: 0.3 10*3/uL (ref 0.1–1.0)
Monocytes Relative: 5 %
Neutro Abs: 5 10*3/uL (ref 1.7–7.7)
Neutrophils Relative %: 85 %
Platelets: 150 10*3/uL (ref 150–400)
RBC: 3.76 MIL/uL — ABNORMAL LOW (ref 3.87–5.11)
RDW: 18.8 % — ABNORMAL HIGH (ref 11.5–15.5)
WBC: 5.8 10*3/uL (ref 4.0–10.5)
nRBC: 0 % (ref 0.0–0.2)

## 2022-04-14 LAB — GLUCOSE, CAPILLARY
Glucose-Capillary: 126 mg/dL — ABNORMAL HIGH (ref 70–99)
Glucose-Capillary: 80 mg/dL (ref 70–99)
Glucose-Capillary: 80 mg/dL (ref 70–99)
Glucose-Capillary: 91 mg/dL (ref 70–99)
Glucose-Capillary: 85 mg/dL (ref 70–99)
Glucose-Capillary: 115 mg/dL — ABNORMAL HIGH (ref 70–99)

## 2022-04-14 LAB — SARS CORONAVIRUS 2 BY RT PCR: SARS Coronavirus 2 by RT PCR: NEGATIVE

## 2022-04-14 MED ORDER — POTASSIUM CHLORIDE 10 MEQ/100ML IV SOLN
10.0000 meq | INTRAVENOUS | Status: AC
  Administered 2022-04-14 (×4): 10 meq via INTRAVENOUS
  Filled 2022-04-14 (×4): qty 100

## 2022-04-14 MED ORDER — POTASSIUM CHLORIDE 20 MEQ PO PACK
40.0000 meq | PACK | Freq: Once | ORAL | Status: AC
Start: 2022-04-14 — End: 2022-04-14
  Administered 2022-04-14: 40 meq
  Filled 2022-04-14: qty 2

## 2022-04-14 MED ORDER — FUROSEMIDE 10 MG/ML IJ SOLN
40.0000 mg | Freq: Every day | INTRAMUSCULAR | Status: DC
  Administered 2022-04-14 – 2022-04-18 (×5): 40 mg via INTRAVENOUS
  Filled 2022-04-14 (×5): qty 4

## 2022-04-14 MED ORDER — FUROSEMIDE 10 MG/ML IJ SOLN
40.0000 mg | Freq: Once | INTRAMUSCULAR | Status: AC
  Administered 2022-04-14: 40 mg via INTRAVENOUS
  Filled 2022-04-14: qty 4

## 2022-04-14 NOTE — Progress Notes (Signed)
Daily Progress Note   Patient Name: Tara [name]       Date: 04/14/2022 DOB: 13-Jun-1946  Age: 76 y.o. MRN#: [id number] Attending Physician: Ashok Pall, MD Primary Care Physician: [name], MD Admit Date: 03/15/2022  Reason for Consultation/Follow-up: Establishing goals of care  Patient Profile/HPI: Tara [name] is a 76 y.o. female with multiple medical problems including cardiomyopathy, history of A-fib on anticoagulation with Eliquis, CKD stage IIIb, history of breast cancer status post chemoradiation in 2008, who was admitted to the hospital 03/15/2022 with new onset seizures she had known meningioma, which was found to have increased in size on CT with surrounding edema.  Patient underwent bilateral formal craniotomy for meningioma resection on 03/24/2020.  She has had a prolonged hospitalization with minimal improvement neurologically.  Palliative care was consulted to address goals.    Subjective: Chart reviewed including labs and progress notes.  She is having little neurological improvement.  Tube feedings are now on hold due to concerns for aspiration. On my evaluation she does not open her eyes to any stimulation.  She does not squeeze my hands when asked.  She grunts intermittently.  Her sister-in-law is at bedside. I called her spouse for follow-up there was no answer and no opportunity to leave a voicemail. I called her daughter [name]. Per [name] the family is not interested in a palliative meeting until they receive information directly from the neurosurgeon.  [name] expressed frustrations with communication from medical team and requested a phone call from the attending provider.  Review of Systems  Unable to perform ROS: Mental status change     Physical  Exam Vitals reviewed.  Cardiovascular:     Rate and Rhythm: Normal rate.  Pulmonary:     Effort: Pulmonary effort is normal.  Neurological:     Comments: Not following commands, minimally responsive             Vital Signs: BP 102/63 (BP Location: Left Arm)   Pulse (!) 101   Temp 97.8 F (36.6 C) (Oral)   Resp (!) 27   Ht '4\' 9"'$  (1.448 m)   Wt 82.4 kg   SpO2 93%   BMI 39.31 kg/m  SpO2: SpO2: 93 % O2 Device: O2 Device: Nasal Cannula O2 Flow Rate: O2 Flow Rate (L/min): 10 L/min  Intake/output  summary:  Intake/Output Summary (Last 24 hours) at 04/14/2022 1210 Last data filed at 04/14/2022 1032 Gross per 24 hour  Intake 1002.48 ml  Output 500 ml  Net 502.48 ml   LBM: Last BM Date : 04/13/22 Baseline Weight: Weight: 85 kg Most recent weight: Weight: 82.4 kg       Palliative Assessment/Data: PPS: 10%      Patient Active Problem List   Diagnosis Date Noted  . Palliative care encounter   . Acute respiratory failure with hypoxia ([place])   . Aspiration into airway   . Pressure injury of skin 04/07/2022  . Thrombocytopenia ([place])   . Meningioma ([place]) 03/24/2022  . Seizure ([place]) 03/15/2022  . Atrial fibrillation, chronic ([place]) 03/15/2022  . Protein-calorie malnutrition, moderate ([place]) 03/15/2022  . Atrial fibrillation with RVR ([place])   . Metabolic acidosis 22/10/5425  . Acute renal failure superimposed on stage 3b chronic kidney disease ([place]) 02/27/2022  . Anxiety and depression 02/27/2022  . Meningioma, cerebral ([place]) 02/27/2022  . Normocytic anemia 02/27/2022  . Acute renal failure (ARF) ([place]) 02/27/2022  . Hemorrhoids 01/04/2021  . Ankle deformity, right 09/12/2020  . 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  . Port-A-Cath in place 09/28/2018  . 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 ([place]) 06/09/2018  . Hypertension 01/25/2018  . 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 Demand ischemia ([place])   . Prolonged Q-T  interval on ECG 09/29/2016  . Chronic kidney disease (CKD), stage III (moderate) ([place]) 09/29/2016  . Leukocytosis 09/18/2016  . Abnormal CT scan, chest   . Upper airway cough syndrome  vs Cough variant asthma  04/09/2016  . Seasonal allergies 08/08/2014  . Family history of colon cancer 03/30/2014  . Osteoporosis 02/14/2014  . Metabolic syndrome X 03/15/7627  . SVT (supraventricular tachycardia) ([place]) 03/14/2013  . Chronic systolic CHF (congestive heart failure) ([place]) 03/14/2013  . Vitamin D deficiency 07/23/2010  . Morbid obesity ([place]) 07/04/2009  . Malignant neoplasm of right breast ([place]) 12/13/2007  . Hyperlipidemia 12/13/2007  . Anxiety state 12/13/2007  . Depression, major, single episode, in partial remission ([place]) 12/13/2007  . GERD 12/13/2007    Palliative Care Assessment & Plan    Assessment/Recommendations/Plan  Continue current plan of care, full scope, full code Family requests communication from neurosurgery regarding patient's status and prognosis prior to having a palliative care meeting Palliative will follow-up with family after neurosurgery has been able to speak with them- they request communication with daughter- Ricki Rodriguez- patient's spouse has limited medical literacy and is not reliable in conveying information that is discussed with him to the rest of the family   Code Status: Full code  Prognosis:  Given patient's prolonged mental status change after surgery with minimal recovery, possible aspiration, her prognosis for functional recovery is poor  Discharge Planning: To Be Determined  Care plan was discussed with patient's daughter- [name]- messages sent to hospitalist and neurosurgery attending and nursing  Thank you for allowing the Palliative Medicine Team to assist in the care of this patient.  Greater than 50%  of this time was spent counseling and coordinating care related to the above assessment and plan.  Mariana Kaufman,  AGNP-C Palliative Medicine   Please contact Palliative Medicine Team phone at [phone] for questions and concerns.

## 2022-04-14 NOTE — Progress Notes (Signed)
Pharmacy Antibiotic Note  Tara [name] is a 76 y.o. female with PNA on cefepime and to change to [facility] for concern of aspiration PNA. Pharmacy consulted to dose unasyn -WBC 5.8, tmax= 100.9 -CrCl ~ 45  Plan: -Continue Unasyn 3gm IV q8h -Will follow renal function, cultures and clinical progress  Height: '4\' 9"'$  (144.8 cm) Weight: 82.4 kg (181 lb 10.5 oz) IBW/kg (Calculated) : 38.6  Temp (24hrs), Avg:99.2 F (37.3 C), Min:98.3 F (36.8 C), Max:100.9 F (38.3 C)  Recent Labs  Lab 04/10/22 0422 04/11/22 0635 04/12/22 0422 04/12/22 1801 04/13/22 0212 04/14/22 0527  WBC 5.2 6.1 5.6 6.4 5.9 5.8  CREATININE 0.78 0.82 0.95  --  0.90 0.95     Estimated Creatinine Clearance: 44.6 mL/min (by C-G formula based on SCr of 0.95 mg/dL).    Allergies  Allergen Reactions   Aspirin Other (See Comments)    Reports GI bleed--pt is currently taking   Lisinopril Cough   Sudafed [Pseudoephedrine Hcl]     Pt reports she had drainage in throat that made her throat hurt.     Unasyn 7/6 >> 7/11; 7/21 >> Vanc 7/19 >> 7/21 Cefepime 7/19 >>  7/21  7/18 UCx - multiple species present, suggest recollection 7/18 BCx - NGTD final  7/24 BCx - NGTD <12 hrs  Luisa Hart, PharmD, BCPS Clinical Pharmacist 04/14/2022 12:00 PM   Please refer to [facility] for pharmacy phone number

## 2022-04-14 NOTE — Progress Notes (Signed)
Patient ID: JAILEN LUNG, female   DOB: 1945-11-22, 76 y.o.   MRN: [id number] BP 96/73 (BP Location: Left Wrist)   Pulse 90   Temp 98.7 F (37.1 C) (Axillary)   Resp (!) 24   Ht '4\' 9"'$  (1.448 m)   Wt 82.4 kg   SpO2 92%   BMI 39.31 kg/m  Lethargic, not speaking, not following commands. Patient had mental status change prior to surgery. Family was instructed as documented that the meningioma which had experienced growth over a 20 year span was the one and only cause for the seizures and mental status change. Post operatively she has had multiple scans, none which show significant change in the cerebrum. There currently is no good explanation for her prolonged mental decline. She will need an MRI to determine if something else is happening. The ct is limited in providing information.  As her primary, and a neurosurgeon I will not be dictating her anticonvulsant treatment nor therapy. I rely on the consults for guidance as they were intimately involved with her care prior to my involvement. She presented with a tonic clonic seizure and neurology called at that time. They will be called again as I am not an expert in treating seizures, and will defer to their expertise.  Known pneumonia or at the very least known pulmonary infiltrates might be one reason she has compromised respiratory function. With pulmonary edema and an effusion.  On exam Mrs. Schrier localizes, pupils equal round and reactive. +corneals,+startle reflex, reacts to noxious stimuli.

## 2022-04-14 NOTE — Progress Notes (Signed)
PROGRESS NOTE    Tara [name]  WUJ:[id number] DOB: 1945/12/07 DOA: 03/15/2022 PCP: [name], MD   Brief Narrative:  Patient is a 76 year old African-American female currently with past medical history significant for but not limited to anxiety, depression, diabetes mellitus type 2, hypertension, hyperlipidemia, cardiomyopathy, history of atrial fibrillation on anticoagulation with Eliquis, chronic kidney disease stage IIIb, history of breast cancer status post chemoradiation in 2008 as well as other comorbidities who presented to the hospital on 03/15/2022 with new onset seizures.  She had a known sphenoidal meningioma that was noted to be increased in size on the CT scan with surrounding edema so she is transferred from [facility] for further evaluation.  She underwent LTM with no further seizures and was evaluated by neurosurgery and underwent bifrontal craniotomy for meningioma resection on 03/24/2020 and was transferred to the intensive care unit postoperatively with PCCM consulting while she was in ICU.  Current clinical course and significant Hospital events include the following but not limited to:     Significant Hospital Events: Including procedures, antibiotic start and stop dates in addition to other pertinent events   6/24 presented to [facility] ED with new onset seizures, CTH with enlarging meningioma with edema, loaded with Keppra 7/3 Underwent bifrontal craniotomy for meningioma, monitor in ICU 7/5 stable, no further seizure activity 7/6 less responsive, two episodes of vomiting overnight and increased oral secretions today.  Continuous blinking on exam. CTH yesterday with new 7 mm thick subdural hematoma at the foramen magnum and tracking adjacent to the basion and odontoid not currently causing critical stenosis of the foramen magnum per radiology read, but merits surveillance. 7/7 Better night and more awake and responsive, repeat head CT unchanged and no seizures on video  EEG 7/9 Recurrent seizure overnight requiring '4mg'$  of IV ativan to break. This am less responsive with forced right gaze  7/10 no further seizure activity, awake, responding, no forced gaze 7/11 recurrent sz like activity. Not correlated on EEG. CT unchanged.     Assessment and Plan:  Planum Sphenoidal meningioma, enlarging with new vasogenic edema and new onset seizures History is consistent with tonic-clonic seizure with no previous history of seizures Neurosurgery is following and she is status post bifrontal craniotomy for meningioma resection Continue Levetiracetam 1000 mg per Tube BID and Lacosamide 200 mg per Tube BID per neurology recommendations Seizure precautions with PRN benzodiazepines Repeat CT head unremarkable Patient is 2 mg of IV lorazepam every 10 minutes as needed Continue PT/OT evaluation and they are recommend SNF  Sepsis in setting of HCAP Vs aspiration PNA Acute hypoxic respiratory failure- On 14L of HFNC Febrile on 7/23 on 100.9 BC x2 NGTD, repeat pending  SARS COVID-negative UA unremarkable for infection, UC showing multiple species Chest x-ray done and showed "New hazy opacities projecting over both upper lobes in the suprahilar regions could reflect developing infection in the correct clinical setting CT chest, abdomen/pelvis, showed no PE, multifocal groundglass opacity likely pulmonary edema CT abdomen pelvis with no evidence of hemorrhage PCCM rec switching to IV Unasyn Start IV Lasix Continue DuoNebs, nebulizers PCCM re-consulted, appreciate recs Continue to hold tube feeds Continue supplemental oxygen as needed   ABLA Iron deficiency anemia Acute on chronic thrombocytopenia Hemoglobin dropped to 6.5 on 7/22 Type and screen Transfused 2 units of PRBC on 7/22 Anemia panel iron 15, sats 8, ferritin 440, vitamin B12-->356 Continue monitor for signs and symptoms bleeding; no overt bleeding noted Daily CBC  Hypokalemia Replace as needed  Anxiety  and Depression Continue with sertraline 25 mg per tube daily    HTN 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 per tube daily, and Rosuvastatin 20 mg per tube daily   Type 2 DM Continue SSI, Accu-Cheks, hypoglycemic protocol   GERD/GI prophylaxis Continue pantoprazole 40 mg per tube daily   Dysphagia Hold tube feeds for now On Glucerna 1.5 at 1000 mL per tube at 40 MLS per hour every 24 hours as well as Prosource tube feeding liquid 45 MLS per tube twice daily  Nutrition Status: Nutrition Problem: Inadequate oral intake Etiology: inability to eat Signs/Symptoms: NPO status Interventions: Tube feeding, MVI SLP evaluated and recommending n.p.o.  Obesity Lifestyle modification advised -Estimated body mass index is 39.31 kg/m as calculated from the following:   Height as of this encounter: '4\' 9"'$  (1.448 m).   Weight as of this encounter: 82.4 kg.   GOC discussion Due to multiple comorbidities, minimally responsive state, overall poor prognosis Palliative care consulted for further goals of care discussion     DVT prophylaxis: SCDs Start: 03/26/22 0840 Place TED hose Start: 03/24/22 0616 Place and maintain sequential compression device Start: 03/23/22 0631    Code Status: Full Code Family Communication: Discussed with daughter over the phone on 04/14/22  Disposition Plan:  Level of care: Progressive Status is: Inpatient Remains inpatient appropriate because: Minimally responsive and will need SNF once stable for discharge   Consultants:  PCCM Neurosurgery Neurology Palliative  Procedures:  See as above  Antimicrobials:  Anti-infectives (From admission, onward)     Start     Dose/Rate Route Frequency Ordered Stop   04/12/22 1500  Ampicillin-Sulbactam (UNASYN) 3 g in sodium chloride 0.9 % 100 mL IVPB        3 g 200 mL/hr over 30 Minutes Intravenous Every 8 hours 04/12/22 1409     04/11/22 1845  Ampicillin-Sulbactam (UNASYN) 3 g in sodium chloride 0.9 % 100 mL IVPB  Status:  Discontinued        3 g 200 mL/hr over 30 Minutes Intravenous Every 8 hours 04/11/22 1754 04/12/22 1409   04/10/22 1200  vancomycin (VANCOREADY) IVPB 750 mg/150 mL  Status:  Discontinued        750 mg 150 mL/hr over 60 Minutes Intravenous Every 24 hours 04/09/22 1034 04/11/22 1100   04/10/22 0900  metroNIDAZOLE (FLAGYL) IVPB 500 mg  Status:  Discontinued        500 mg 100 mL/hr over 60 Minutes Intravenous Every 12 hours 04/10/22 0754 04/11/22 1754   04/09/22 2300  ceFEPIme (MAXIPIME) 2 g in sodium chloride 0.9 % 100 mL IVPB  Status:  Discontinued        2 g 200 mL/hr over 30 Minutes Intravenous Every 12 hours 04/09/22 1035 04/11/22 1717   04/09/22 1045  vancomycin (VANCOREADY) IVPB 1750 mg/350 mL        1,750 mg 175 mL/hr over 120 Minutes Intravenous  Once 04/09/22 0953 04/09/22 1344   04/09/22 1045  ceFEPIme (MAXIPIME) 2 g in sodium chloride 0.9 % 100 mL IVPB        2 g 200 mL/hr over 30 Minutes Intravenous  Once 04/09/22 0953 04/09/22 1524   03/27/22 0945  Ampicillin-Sulbactam (UNASYN) 3 g in sodium  chloride 0.9 % 100 mL IVPB        3 g 200 mL/hr over 30 Minutes Intravenous Every 6 hours 03/27/22 0859 04/01/22 0518   03/25/22 0600  ceFAZolin (ANCEF) IVPB 2g/100 mL premix        2 g 200 mL/hr over 30 Minutes Intravenous On call to O.R. 03/24/22 1215 03/24/22 1440       Subjective: Patient seen and examined at bedside.  Noted lipsmacking.  Still remains unresponsive.   Objective: Vitals:   04/14/22 0722 04/14/22 0946 04/14/22 0950 04/14/22 1122  BP: (!) 106/59   102/63  Pulse: 88 91  (!) 101  Resp: 20 (!) 26  (!) 27  Temp: 98.3 F (36.8 C)   97.8 F (36.6 C)   TempSrc: Oral   Oral  SpO2: 95% 90% 90% 93%  Weight:      Height:        Intake/Output Summary (Last 24 hours) at 04/14/2022 1644 Last data filed at 04/14/2022 1032 Gross per 24 hour  Intake 1002.48 ml  Output 500 ml  Net 502.48 ml   Filed Weights   04/12/22 1901 04/13/22 0500 04/14/22 0500  Weight: 76.4 kg 79 kg 82.4 kg   Examination: Physical Exam: General: NAD, minimally responsive, noted scar across forehead Cardiovascular: S1, S2 present Respiratory: Rhonchi noted bilaterally Abdomen: Soft, nontender, nondistended, bowel sounds present Musculoskeletal: No bilateral pedal edema noted Skin: Normal Psychiatry: Unable to assess Neurology: Unable to follow commands   Data Reviewed: I have personally reviewed following labs and imaging studies  CBC: Recent Labs  Lab 04/11/22 0635 04/12/22 0422 04/12/22 1801 04/13/22 0212 04/14/22 0527  WBC 6.1 5.6 6.4 5.9 5.8  NEUTROABS 5.5 4.9 5.4 5.0 5.0  HGB 7.1* 6.5* 10.5* 10.6* 10.4*  HCT 21.6* 19.8* 30.8* 32.3* 31.6*  MCV 85.7 85.7 82.4 83.2 84.0  PLT 112* 127* 113* 135* 258   Basic Metabolic Panel: Recent Labs  Lab 04/08/22 1008 04/09/22 0221 04/10/22 0422 04/11/22 0635 04/12/22 0422 04/13/22 0212 04/14/22 0527  NA 136 135 134* 135 140 144 147*  K 3.5 4.0 3.8 3.9 3.0* 3.8 3.4*  CL 103 101 103 104 105 110 113*  CO2 24 21* 23 21* '25 24 22  '$ GLUCOSE 147* 115* 142* 161* 132* 108* 106*  BUN 26* 26* 29* 35* 40* 31* 28*  CREATININE 0.83 0.75 0.78 0.82 0.95 0.90 0.95  CALCIUM 8.1* 8.1* 8.1* 8.1* 8.2* 8.4* 8.3*  MG 2.1 2.0  --   --  2.2  --   --   PHOS 2.5 2.8  --   --   --   --   --    GFR: Estimated Creatinine Clearance: 44.6 mL/min (by C-G formula based on SCr of 0.95 mg/dL). Liver Function Tests: Recent Labs  Lab 04/08/22 1008 04/09/22 0221  AST 24 26  ALT 36 36  ALKPHOS 74 68  BILITOT 0.6 0.5  PROT 5.5* 5.3*  ALBUMIN 2.1* 2.0*   No results for input(s): "LIPASE", "AMYLASE" in the last 168 hours. No  results for input(s): "AMMONIA" in the last 168 hours. Coagulation Profile: No results for input(s): "INR", "PROTIME" in the last 168 hours. Cardiac Enzymes: No results for input(s): "CKTOTAL", "CKMB", "CKMBINDEX", "TROPONINI" in the last 168 hours. BNP (last 3 results) No results for input(s): "PROBNP" in the last 8760 hours. HbA1C: No results for input(s): "HGBA1C" in the last 72 hours. CBG: Recent Labs  Lab 04/13/22 2005 04/13/22 2319 04/14/22 0329 04/14/22 0752 04/14/22 1130  GLUCAP 98 119* 126* 80 80   Lipid Profile: No results for input(s): "CHOL", "HDL", "LDLCALC", "TRIG", "CHOLHDL", "LDLDIRECT" in the last 72 hours. Thyroid Function Tests: No results for input(s): "TSH", "T4TOTAL", "FREET4", "T3FREE", "THYROIDAB" in the last 72 hours. Anemia Panel: No results for input(s): "VITAMINB12", "FOLATE", "FERRITIN", "TIBC", "IRON", "RETICCTPCT" in the last 72 hours.  Sepsis Labs: No results for input(s): "PROCALCITON", "LATICACIDVEN" in the last 168 hours.  Recent Results (from the past 240 hour(s))  Urine Culture     Status: Abnormal   Collection Time: 04/08/22  8:40 AM   Specimen: Urine, Clean Catch  Result Value Ref Range Status   Specimen Description URINE, CLEAN CATCH  Final   Special Requests   Final    NONE Performed at [facility], [address], [place], [place] [address]    Culture MULTIPLE SPECIES PRESENT, SUGGEST RECOLLECTION (A)  Final   Report Status 04/09/2022 FINAL  Final  Culture, blood (Routine X 2) w Reflex to ID Panel     Status: None   Collection Time: 04/08/22 10:08 AM   Specimen: BLOOD  Result Value Ref Range Status   Specimen Description BLOOD LEFT ANTECUBITAL  Final   Special Requests   Final    BOTTLES DRAWN AEROBIC AND ANAEROBIC Blood Culture adequate volume   Culture   Final    NO GROWTH 5 DAYS Performed at [facility], [place] [address], [place], [place] [address]    Report Status 04/13/2022 FINAL  Final  Culture, blood  (Routine X 2) w Reflex to ID Panel     Status: None   Collection Time: 04/08/22 10:12 AM   Specimen: BLOOD LEFT FOREARM  Result Value Ref Range Status   Specimen Description BLOOD LEFT FOREARM  Final   Special Requests   Final    BOTTLES DRAWN AEROBIC AND ANAEROBIC Blood Culture adequate volume   Culture   Final    NO GROWTH 5 DAYS Performed at [facility], [place] [address], [place], [place] [address]    Report Status 04/13/2022 FINAL  Final  MRSA Next Gen by PCR, Nasal     Status: None   Collection Time: 04/10/22 11:58 AM   Specimen: Nasal Mucosa; Nasal Swab  Result Value Ref Range Status   MRSA by PCR Next Gen NOT DETECTED NOT DETECTED Final    Comment: (NOTE) The GeneXpert MRSA Assay (FDA approved for NASAL specimens only), is one component of a comprehensive MRSA colonization surveillance program. It is not intended to diagnose MRSA infection nor to guide or monitor treatment for MRSA infections. Test performance is not FDA approved in patients less than 3 years old. Performed at [facility], [place] [address] [place], [place] [address]   Culture, blood (Routine X 2) w Reflex to ID Panel     Status: None (Preliminary result)   Collection Time: 04/14/22  5:27 AM   Specimen: BLOOD LEFT HAND  Result Value Ref Range Status   Specimen Description BLOOD LEFT HAND  Final   Special Requests   Final    BOTTLES DRAWN AEROBIC ONLY Blood Culture results may not be optimal due to an inadequate volume of blood received in culture bottles   Culture   Final    NO GROWTH < 12 HOURS Performed at [facility], [place] [address] [place], [place] [address]    Report Status PENDING  Incomplete  Culture, blood (Routine X 2) w Reflex to ID Panel  Status: None (Preliminary result)   Collection Time: 04/14/22  5:51 AM   Specimen: BLOOD LEFT HAND  Result Value Ref Range Status   Specimen Description BLOOD LEFT HAND  Final   Special Requests   Final    BOTTLES DRAWN AEROBIC AND  ANAEROBIC Blood Culture adequate volume   Culture   Final    NO GROWTH < 12 HOURS Performed at [facility], [address], [place], [place] [address]    Report Status PENDING  Incomplete  SARS Coronavirus 2 by RT PCR (hospital order, performed in [facility] hospital lab) *cepheid single result test* Anterior Nasal Swab     Status: None   Collection Time: 04/14/22  1:12 PM   Specimen: Anterior Nasal Swab  Result Value Ref Range Status   SARS Coronavirus 2 by RT PCR NEGATIVE NEGATIVE Final    Comment: (NOTE) SARS-CoV-2 target nucleic acids are NOT DETECTED.  The SARS-CoV-2 RNA is generally detectable in upper and lower respiratory specimens during the acute phase of infection. The lowest concentration of SARS-CoV-2 viral copies this assay can detect is 250 copies / mL. A negative result does not preclude SARS-CoV-2 infection and should not be used as the sole basis for treatment or other patient management decisions.  A negative result may occur with improper specimen collection / handling, submission of specimen other than nasopharyngeal swab, presence of viral mutation(s) within the areas targeted by this assay, and inadequate number of viral copies (<250 copies / mL). A negative result must be combined with clinical observations, patient history, and epidemiological information.  Fact Sheet for Patients:   https://www.patel.info/  Fact Sheet for Healthcare Providers: https://hall.com/  This test is not yet approved or  cleared by the [place] FDA and has been authorized for detection and/or diagnosis of SARS-CoV-2 by FDA under an Emergency Use Authorization (EUA).  This EUA will remain in effect (meaning this test can be used) for the duration of the COVID-19 declaration under Section 564(b)(1) of the Act, 21 U.S.C. section 360bbb-3(b)(1), unless the authorization is terminated or revoked sooner.  Performed at [facility], [place] [address] [place], [place] [address]      Radiology Studies: CT ABDOMEN PELVIS W CONTRAST  Result Date: 04/13/2022 CLINICAL DATA:  Sepsis.  Drop in hemoglobin, rule out hemorrhage. EXAM: CT ABDOMEN AND PELVIS WITH CONTRAST TECHNIQUE: Multidetector CT imaging of the abdomen and pelvis was performed using the standard protocol following bolus administration of intravenous contrast. RADIATION DOSE REDUCTION: This exam was performed according to the departmental dose-optimization program which includes automated exposure control, adjustment of the mA and/or kV according to patient size and/or use of iterative reconstruction technique. CONTRAST:  146m OMNIPAQUE IOHEXOL 350 MG/ML SOLN COMPARISON:  06/18/2016. FINDINGS: Lower chest: The heart is enlarged and there is no pericardial effusion. There are patchy atelectasis or infiltrate at the lung bases. Hepatobiliary: No focal liver abnormality is seen. Status post cholecystectomy. No biliary dilatation. Pancreas: Unremarkable. No pancreatic ductal dilatation or surrounding inflammatory changes. Spleen: Normal in size without focal abnormality. Adrenals/Urinary Tract: No adrenal nodule or mass. A cyst is present in the upper pole the right kidney. No renal calculus or hydronephrosis. The bladder is unremarkable. Stomach/Bowel: An enteric tube terminates in the proximal duodenum. There is a small paraesophageal hiatal hernia. No bowel obstruction, free air, or pneumatosis. The appendix is not visualized on exam. Vascular/Lymphatic: Aortic atherosclerosis. No enlarged abdominal or pelvic lymph nodes. Reproductive: Status post hysterectomy. No adnexal masses.  Other: No abdominal wall hernia or abnormality. No abdominopelvic ascites. No focal hemorrhage is seen. Musculoskeletal: Degenerative changes are present in the thoracolumbar spine. No acute osseous abnormality. IMPRESSION: 1. No evidence of hemorrhage. 2. Patchy atelectasis or infiltrate at the  lung bases. 3. Paraesophageal hiatal hernia. 4. Right renal cyst. 5. Aortic atherosclerosis. Electronically Signed   By: Brett Fairy M.D.   On: 04/13/2022 23:36   CT Angio Chest Pulmonary Embolism (PE) W or WO Contrast  Result Date: 04/13/2022 CLINICAL DATA:  Sepsis and anemia EXAM: CT ANGIOGRAPHY CHEST WITH CONTRAST TECHNIQUE: Multidetector CT imaging of the chest was performed using the standard protocol during bolus administration of intravenous contrast. Multiplanar CT image reconstructions and MIPs were obtained to evaluate the vascular anatomy. RADIATION DOSE REDUCTION: This exam was performed according to the departmental dose-optimization program which includes automated exposure control, adjustment of the mA and/or kV according to patient size and/or use of iterative reconstruction technique. CONTRAST:  195m OMNIPAQUE IOHEXOL 350 MG/ML SOLN COMPARISON:  05/04/2018 FINDINGS: Cardiovascular: Contrast injection is sufficient to demonstrate satisfactory opacification of the pulmonary arteries to the segmental level. There is no pulmonary embolus or evidence of right heart strain. The size of the main pulmonary artery is normal. Mild cardiomegaly. The course and caliber of the aorta are normal. There is mild atherosclerotic calcification. Opacification decreased due to pulmonary arterial phase contrast bolus timing. Mediastinum/Nodes: Calcified right hilar adenopathy. No other mediastinal, hilar or axillary lymphadenopathy. Normal visualized thyroid. Thoracic esophageal course is normal. Lungs/Pleura: Multifocal ground-glass opacity within both lungs, likely pulmonary edema. Small right pleural effusion. Calcified granuloma in the right lung. Upper Abdomen: Contrast bolus timing is not optimized for evaluation of the abdominal organs. The visualized portions of the organs of the upper abdomen are normal. Musculoskeletal: No chest wall abnormality. No bony spinal canal stenosis. Review of the MIP images  confirms the above findings. IMPRESSION: 1. No pulmonary embolus or acute aortic syndrome. 2. Multifocal ground-glass opacity within both lungs, likely pulmonary edema. Small right pleural effusion. 3. Calcified granuloma in the right lung with calcified right hilar adenopathy, likely a sequela of remote granulomatous infection. 4. Aortic Atherosclerosis (ICD10-I70.0). Electronically Signed   By: KUlyses JarredM.D.   On: 04/13/2022 23:29     Scheduled Meds:  amiodarone  200 mg Per Tube Daily   budesonide (PULMICORT) nebulizer solution  0.25 mg Nebulization BID   Chlorhexidine Gluconate Cloth  6 each Topical Daily   feeding supplement (GLUCERNA 1.5 CAL)  1,000 mL Per Tube Q24H   feeding supplement (PROSource TF)  45 mL Per Tube BID   free water  200 mL Per Tube Q6H   furosemide  40 mg Intravenous Daily   insulin aspart  0-15 Units Subcutaneous Q4H   insulin glargine-yfgn  10 Units Subcutaneous Daily   lacosamide  200 mg Per Tube BID   levETIRAcetam  1,000 mg Per Tube BID   montelukast  10 mg Per Tube QHS   multivitamin with minerals  1 tablet Per Tube Daily   mouth rinse  15 mL Mouth Rinse 4 times per day   pantoprazole sodium  40 mg Per Tube Daily   polyethylene glycol  17 g Per NG tube Daily   rosuvastatin  20 mg Per Tube Daily   senna  1 tablet Per Tube BID   sertraline  25 mg Per Tube Daily   Continuous Infusions:  ampicillin-sulbactam (UNASYN) IV 3 g (04/14/22 1515)     LOS: 30 days  Alma Friendly, MD [facility] Available via Epic secure chat 7am-7pm After these hours, please refer to coverage provider listed on [email or web address] 04/14/2022, 4:44 PM

## 2022-04-14 NOTE — Progress Notes (Signed)
Speech Language Pathology Treatment: Dysphagia;Cognitive-Linquistic  Patient Details Name: Tara [name] MRN: [id number] DOB: [birth year] Today's Date: 04/14/2022 Time: 1130-1150 SLP Time Calculation (min) (ACUTE ONLY): 20 min  Assessment / Plan / Recommendation Clinical Impression  Pt alert upon SLP arrival, eyes open , but gaze deviated to the right. Pt able to pull gaze to midline several times during session with verbal cues, particularly from a family member. SLP has constant lip smacking today that she cannot stop with verbal cues. She does briefly stop when PO touches lips, but still smacks during oral manipulation of water or puree. In most instances pt is able to swallow with some delay, but with larger liquid boluses there is immediate coughing, likely due to liquids falling back too quickly with smacking behavior. Pt will need further improvement prior to diet initiation, but she is improving. Will f/u.   HPI HPI: Pt is a 76 y/o who presented 6/24 with new onset seizures. 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 EEG negative for seizures. Neurosurgey consulted and did not recommend meningioma resection as of 6/27. Decline in swallow function noted overnight 6/27. Dx acute toxic metabolic encephalopathy suspected secondary to keppra. Pt s/p Bifrontal craniotomy for resection of meningioma 7/3. PMH: HTN, HLD, GERD, CKD stage IIIb, chronic HFpEF, meningioma, breast CA-s/p right lumpectomy and chemoradiation in 2008, depression/anxiety. BSE 03/11/22: regular texture diet and thin liquids without need for follow up.      SLP Plan  Continue with current plan of care      Recommendations for follow up therapy are one component of a multi-disciplinary discharge planning process, led by the attending physician.  Recommendations may be updated based on patient status, additional functional criteria and insurance authorization.    Recommendations                    Oral Care Recommendations: Oral care QID Follow Up Recommendations: Skilled nursing-short term rehab (<3 hours/day) Assistance recommended at discharge: Frequent or constant Supervision/Assistance Plan: Continue with current plan of care           Lorie Cleckley, [name]  04/14/2022, 1:44 PM

## 2022-04-14 NOTE — Plan of Care (Signed)
Patient remains unresponsive, no tube feeding per report since Friday April 11, 2022 with concerns for aspiration.  CT CAP and CT PE study completed.    Problem: Education: Goal: Ability to describe self-care measures that may prevent or decrease complications (Diabetes Survival Skills Education) will improve 04/14/2022 0209 by Rosezetta Schlatter, RN Outcome: Not Progressing 04/14/2022 0208 by Rosezetta Schlatter, RN Outcome: Not Progressing Goal: Individualized Educational Video(s) 04/14/2022 0209 by Rosezetta Schlatter, RN Outcome: Not Progressing 04/14/2022 0208 by Rosezetta Schlatter, RN Outcome: Not Progressing   Problem: Coping: Goal: Ability to adjust to condition or change in health will improve 04/14/2022 0209 by Rosezetta Schlatter, RN Outcome: Not Progressing 04/14/2022 0208 by Rosezetta Schlatter, RN Outcome: Not Progressing   Problem: Fluid Volume: Goal: Ability to maintain a balanced intake and output will improve 04/14/2022 0209 by Rosezetta Schlatter, RN Outcome: Progressing 04/14/2022 0208 by Rosezetta Schlatter, RN Outcome: Not Progressing   Problem: Health Behavior/Discharge Planning: Goal: Ability to identify and utilize available resources and services will improve 04/14/2022 0209 by Rosezetta Schlatter, RN Outcome: Progressing 04/14/2022 0208 by Rosezetta Schlatter, RN Outcome: Progressing Goal: Ability to manage health-related needs will improve 04/14/2022 0209 by Rosezetta Schlatter, RN Outcome: Not Progressing 04/14/2022 0208 by Rosezetta Schlatter, RN Outcome: Progressing   Problem: Metabolic: Goal: Ability to maintain appropriate glucose levels will improve 04/14/2022 0209 by Rosezetta Schlatter, RN Outcome: Progressing 04/14/2022 0208 by Rosezetta Schlatter, RN Outcome: Progressing   Problem: Nutritional: Goal: Maintenance of adequate nutrition will improve 04/14/2022 0209 by Rosezetta Schlatter, RN Outcome: Not Progressing 04/14/2022 0208 by Rosezetta Schlatter, RN Outcome: Not Progressing Goal: Progress toward achieving an optimal weight will  improve 04/14/2022 0209 by Rosezetta Schlatter, RN Outcome: Progressing 04/14/2022 0208 by Rosezetta Schlatter, RN Outcome: Progressing   Problem: Skin Integrity: Goal: Risk for impaired skin integrity will decrease 04/14/2022 0209 by Rosezetta Schlatter, RN Outcome: Not Progressing 04/14/2022 0208 by Rosezetta Schlatter, RN Outcome: Not Progressing   Problem: Tissue Perfusion: Goal: Adequacy of tissue perfusion will improve 04/14/2022 0209 by Rosezetta Schlatter, RN Outcome: Not Progressing 04/14/2022 0208 by Rosezetta Schlatter, RN Outcome: Not Progressing   Problem: Education: Goal: Knowledge of General Education information will improve Description: Including pain rating scale, medication(s)/side effects and non-pharmacologic comfort measures 04/14/2022 0209 by Rosezetta Schlatter, RN Outcome: Not Progressing 04/14/2022 0208 by Rosezetta Schlatter, RN Outcome: Not Progressing   Problem: Health Behavior/Discharge Planning: Goal: Ability to manage health-related needs will improve 04/14/2022 0209 by Rosezetta Schlatter, RN Outcome: Not Progressing 04/14/2022 0208 by Rosezetta Schlatter, RN Outcome: Not Progressing

## 2022-04-14 NOTE — Progress Notes (Addendum)
NAME:  Tara [name], MRN:  [id number], DOB:  [birth year], LOS: 53 ADMISSION DATE:  03/15/2022, CONSULTATION DATE:  04/14/22 REFERRING MD:  Christella Noa, CHIEF COMPLAINT:  Meningioma, Seizure   History of Present Illness:  Tara [name] is a 75 y.o. F with PMH significant for Anxiety, Depression, T2DM, HTN, HL, cardiomyopathy, Atrial Fibrillation on Eliquis, CKD IIIb, breast Ca s/p chemoradiation in 2008 who presented to the ED 6/24 with new onset seizures.   She had a known planum sphenoidal meningioma that was increased in size on CT with surrounding edema, so was transferred to [facility] for further evaluation.  LTM showed no further seizures.  She was evaluated by neurosurgery and underwent bifrontal craniotomy for meningioma resection on 7/3 and was transferred to intensive care post-op with PCCM consult while in ICU.   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   Significant Hospital Events: Including procedures, antibiotic start and stop dates in addition to other pertinent events   6/24 presented to [facility] ED with new onset seizures, CTH with enlarging meningioma with edema, loaded with Keppra 7/3 Underwent bifrontal craniotomy for meningioma, monitor in ICU 7/5 stable, no  further seizure activity 7/6 less responsive, two episodes of vomiting overnight and increased oral secretions today.  Continuous blinking on exam. CTH yesterday with new 7 mm thick subdural hematoma at the foramen magnum and tracking adjacent to the basion and odontoid not currently causing critical stenosis of the foramen magnum per radiology read, but merits surveillance. 7/7 Better night and more awake and responsive, repeat head CT unchanged and no seizures on video EEG 7/9 Recurrent seizure overnight requiring '4mg'$  of IV ativan to break. This am less responsive with forced right gaze  7/10 no further seizure activity, awake, responding, no forced gaze 7/11 recurrent sz like activity. Not correlated on EEG. CT unchanged.  7/21 reconsulted for hypoxia and bilateral infiltrates 7/24 Continued increasing oxygen needs. CT Angio Chest >> No PE's, Multifocal ground glass opacity within both lungs, likely pulmonary edema, small right pleural effusion , Calcified granuloma in the right lung with calcified right hilar adenopathy, likely a sequela of remote granulomatous infection. Interim History / Subjective:  Increased oxygen needs  again today to 14 L HFNC Net negative 4.8 L. CTA showed multifocal GG opacities within both lungs, , pulmonary edema and a small right effusion .  Received Lasix 40 mg x 1 per [facility] for Covid and placed on airborne isolation. 7/24 HGB currently 10.4 after transfusion 7/22 for HGB of 6.5 Pt has eyes open, she is lip smacking, she does not follow commands.  She remains a full code   Objective   Blood pressure 102/63, pulse (!) 101, temperature 97.8 F (36.6 C), temperature source Oral, resp. rate (!) 27, height '4\' 9"'$  (1.448 m), weight 82.4 kg, SpO2 93 %.        Intake/Output Summary (Last 24 hours) at 04/14/2022 1317 Last data filed at 04/14/2022 1032 Gross per  24 hour  Intake 1002.48 ml  Output 500 ml  Net 502.48 ml   Filed Weights   04/12/22 1901  04/13/22 0500 04/14/22 0500  Weight: 76.4 kg 79 kg 82.4 kg    General:  Elderly F in mild distress on 14 HFNC HEENT: frontal crani site is c/d/ well approximated. Some L periorbital ecchymosis. +cortrak  Neuro: Does not follow commands. + cough  CV: rrr cap refill brisk  PULM: Symmetrical chest expansion, diminished basilar sounds anteriorly. Some upper lobe rhonchi GI: soft ndnt  Extremities: no acute deformity  Skin: pale, c/d/w   Labs reviewed Na 147/ K 3.4/ Cl 113/ BUN 28/ Creatinine 0.95/ Calcium 8.3  WBC 5.8/ HGB 10.4/ Platelets 150,000 Blood Cultures drawn Covid Testing done 04/14/2022 Potassium has been repleted with 4 runs IV K ( Total 400 cc's to give)    Resolved Hospital Problem list   AKI  Assessment & Plan:   Acute respiratory failure with hypoxia Suspected aspiration PNA, BUL infiltrates  Dysphagia  7/24>> Increasing oxygen demands now on 14L HFNC -Doubt amiodarone toxicity -CTA negative for PE and + for bilateral GG infiltrates and pulmonary edema.  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, now s/p bifrontal craniectomy Eliquis stopped due to drop in HGB and facial bruise -Op date 7/21  -post op had seizure like activity but none proven on eeg  P -cont keppra, vimpat -sz precautions, PRN BZD    Thrombocytopenia Acute on chronic anemia  -- hgb 6.5 (7/22) -Think worse anemia is iatrogenic losses & marrow suppression w prolonged hospitalization P -looks like 2 PRBC have been ordered 7/22  -goal > 7  -monitor for s/sx bleed - Trend CBC  - CT Abdomen Pelvis >> No evidence of hemorrhage, Paraesophageal hiatal hernia  HTN Chronic HFpEF pAF, currently sinus  Echo 6/9 with EF  55% and Grade I diastolic dysfunction P -cont amio, coreg, crestor   DM2  Acute hypoglycemia (in setting of EN on hold) P -SSI   GERD -PPI  Hypokalemia -replace   GOC Dr. Tacy Learn spoke with family regarding the fact the patient is 21 days post op and quality of life is poor. They need to make a decision regarding Code status.  The most pressing issue would be determination of advanced airway in event of further decline / worse resp status.    Best Practice (right click and "Reselect all SmartList Selections" daily)   Diet/type: tubefeeds -- now on hold  DVT prophylaxis: PAS GI prophylaxis: PPI Lines: N/A Foley:  N/A Code Status:  full code Last date of multidisciplinary goals of care discussion: Palliative care note reviewed 7/21, husband wants to wait until 7/24 and discuss with family before making decisions.  She has become more hypoxic we may have to force  for decision sooner  Critical care: NA     Magdalen Spatz, MSN, AGACNP-BC [place] for personal pager PCCM on call pager [phone]  04/14/2022, 1:17 PM

## 2022-04-14 NOTE — Progress Notes (Signed)
PT Cancellation Note  Patient Details Name: Tara [name] MRN: [id number] DOB: 1946-02-03   Cancelled Treatment:    Reason Eval/Treat Not Completed: Other (comment) Pt not appropriate for therapy at this time, with little to no neurological improvement over the last few sessions. Pt minimally responsive and not following commands or able to participate in therapy at this time. Also of note, requiring increased 02 need to 14L 02 HF per nurse, ruling out Covid. Will sign off, please re consult if any changes in cognition/alertness or ability to participate in therapy services. Thanks.   Marguarite Arbour A Haston Casebolt 04/14/2022, 2:46 PM Marisa Severin, PT, DPT Acute Rehabilitation Services Secure chat preferred Office [phone]

## 2022-04-15 ENCOUNTER — Inpatient Hospital Stay (HOSPITAL_COMMUNITY): Payer: Medicare Other

## 2022-04-15 DIAGNOSIS — R569 Unspecified convulsions: Secondary | ICD-10-CM | POA: Diagnosis not present

## 2022-04-15 DIAGNOSIS — Z515 Encounter for palliative care: Secondary | ICD-10-CM | POA: Diagnosis not present

## 2022-04-15 DIAGNOSIS — T17908D Unspecified foreign body in respiratory tract, part unspecified causing other injury, subsequent encounter: Secondary | ICD-10-CM | POA: Diagnosis not present

## 2022-04-15 DIAGNOSIS — D320 Benign neoplasm of cerebral meninges: Secondary | ICD-10-CM | POA: Diagnosis not present

## 2022-04-15 DIAGNOSIS — J9601 Acute respiratory failure with hypoxia: Secondary | ICD-10-CM | POA: Diagnosis not present

## 2022-04-15 LAB — CBC WITH DIFFERENTIAL/PLATELET
Abs Immature Granulocytes: 0.23 10*3/uL — ABNORMAL HIGH (ref 0.00–0.07)
Basophils Absolute: 0 10*3/uL (ref 0.0–0.1)
Basophils Relative: 0 %
Eosinophils Absolute: 0.1 10*3/uL (ref 0.0–0.5)
Eosinophils Relative: 1 %
HCT: 29.3 % — ABNORMAL LOW (ref 36.0–46.0)
Hemoglobin: 9.8 g/dL — ABNORMAL LOW (ref 12.0–15.0)
Immature Granulocytes: 4 %
Lymphocytes Relative: 9 %
Lymphs Abs: 0.5 10*3/uL — ABNORMAL LOW (ref 0.7–4.0)
MCH: 27.7 pg (ref 26.0–34.0)
MCHC: 33.4 g/dL (ref 30.0–36.0)
MCV: 82.8 fL (ref 80.0–100.0)
Monocytes Absolute: 0.3 10*3/uL (ref 0.1–1.0)
Monocytes Relative: 6 %
Neutro Abs: 4.3 10*3/uL (ref 1.7–7.7)
Neutrophils Relative %: 80 %
Platelets: 160 10*3/uL (ref 150–400)
RBC: 3.54 MIL/uL — ABNORMAL LOW (ref 3.87–5.11)
RDW: 18.7 % — ABNORMAL HIGH (ref 11.5–15.5)
WBC: 5.5 10*3/uL (ref 4.0–10.5)
nRBC: 0.4 % — ABNORMAL HIGH (ref 0.0–0.2)

## 2022-04-15 LAB — BASIC METABOLIC PANEL
Anion gap: 10 (ref 5–15)
BUN: 27 mg/dL — ABNORMAL HIGH (ref 8–23)
CO2: 23 mmol/L (ref 22–32)
Calcium: 8.1 mg/dL — ABNORMAL LOW (ref 8.9–10.3)
Chloride: 110 mmol/L (ref 98–111)
Creatinine, Ser: 1.02 mg/dL — ABNORMAL HIGH (ref 0.44–1.00)
GFR, Estimated: 57 mL/min — ABNORMAL LOW (ref >60)
Glucose, Bld: 93 mg/dL (ref 70–99)
Potassium: 3.4 mmol/L — ABNORMAL LOW (ref 3.5–5.1)
Sodium: 143 mmol/L (ref 135–145)

## 2022-04-15 LAB — MAGNESIUM: Magnesium: 2 mg/dL (ref 1.7–2.4)

## 2022-04-15 LAB — GLUCOSE, CAPILLARY
Glucose-Capillary: 103 mg/dL — ABNORMAL HIGH (ref 70–99)
Glucose-Capillary: 121 mg/dL — ABNORMAL HIGH (ref 70–99)
Glucose-Capillary: 86 mg/dL (ref 70–99)
Glucose-Capillary: 87 mg/dL (ref 70–99)
Glucose-Capillary: 88 mg/dL (ref 70–99)
Glucose-Capillary: 89 mg/dL (ref 70–99)

## 2022-04-15 MED ORDER — POTASSIUM CHLORIDE 20 MEQ PO PACK
40.0000 meq | PACK | Freq: Once | ORAL | Status: AC
  Administered 2022-04-15: 40 meq
  Filled 2022-04-15: qty 2

## 2022-04-15 NOTE — Progress Notes (Signed)
Nutrition Follow-up  DOCUMENTATION CODES:   Obesity unspecified  INTERVENTION:   If within goals of care, recommend reinitiating tube feeds via Cortrak: Glucerna 1.5 @ 40 mL/hr (960 mL/day) 45 mL ProSource TF - BID 200 mL free water flush q6h Provides 1520 kcal, 101 gm protein, and 1620 mL free water.  Continue Multivitamin w/ minerals daily  NUTRITION DIAGNOSIS:   Inadequate oral intake related to inability to eat as evidenced by NPO status. - Ongoing  GOAL:   Patient will meet greater than or equal to 90% of their needs - Ongoing  MONITOR:   I & O's  REASON FOR ASSESSMENT:    (Rounds)    ASSESSMENT:   Pt with PMH of DM, Afib on Eliquis, CKD stage III, CHF, HTN, anemia of chronic illness and sphenoidal meningioma admitted for new onset seizures.  6/24 - admit with new onset seizures 7/03 - s/p bifrontal craniotomy for meningioma resection 7/06 - less responsive, vomiting overnight with suspected aspiration 7/07 - s/p cortrak placement; tip distal stomach/proximal duodenum  7/08 - TF changed to Glucerna 1.5 per MD  7/24 - TF held due to concern of aspiration  Pt not in room at time of RD visit. Per Chart review, pt tube feed is currently being held due to concern of aspiration. Pt with continued lethargy and not following commands. Palliative care team continues to follow and have ongoing discussions with family regarding goals of care.   Medications reviewed and include: Lasix, NovoLog, Semglee, MVI, Protonix, Senokot, IV antibiotics  Labs reviewed: Potassium 3.4,   Diet Order:   Diet Order             Diet NPO time specified  Diet effective now                   EDUCATION NEEDS:   Not appropriate for education at this time  Skin:  Skin Assessment: Skin Integrity Issues: Skin Integrity Issues:: Stage II Stage II: buttocks  Last BM:  7/24  Height:  Ht Readings from Last 1 Encounters:  03/15/22 '4\' 9"'$  (1.448 m)   Weight:  Wt Readings from Last  1 Encounters:  04/14/22 82.4 kg   BMI:  Body mass index is 39.31 kg/m.  Estimated Nutritional Needs:   Kcal:  1500-1700  Protein:  85-100 grams  Fluid:  >1.5 L/day    Hermina Barters RD, LDN Clinical Dietitian See [facility] for contact information.

## 2022-04-15 NOTE — Progress Notes (Signed)
OT Cancellation Note  Patient Details Name: Tara [name] MRN: [id number] DOB: 09/10/1946   Cancelled Treatment:    Reason Eval/Treat Not Completed: Medical issues which prohibited therapy (MRI findings with new SDH. Will hold treatment for 24 hours per therapy guidelines. Will reassess when medically appropriate.)  Kina Shiffman A Alethia Melendrez 04/15/2022, 12:27 PM

## 2022-04-15 NOTE — Progress Notes (Signed)
Spoke with daughter this evening and she is frustrated because their is a lack in communication amongst all team members. Regarding the care of medications ( Dr. Christella Noa stated he is not in charge of the seizure meds), well then who is? Also neuro has not seen pt since July 13 and [name] would like to know why they have not been contacted and re- consulted. [name] is also concerned with the holding of the tube feeding.

## 2022-04-15 NOTE — Progress Notes (Signed)
Daily Progress Note   Patient Name: Tara [name]       Date: 04/15/2022 DOB: 1946-05-08  Age: 76 y.o. MRN#: [id number] Attending Physician: Ashok Pall, MD Primary Care Physician: [name], MD Admit Date: 03/15/2022  Reason for Consultation/Follow-up: Establishing goals of care  Patient Profile/HPI:  Tara [name] is a 76 y.o. female with multiple medical problems including cardiomyopathy, history of A-fib on anticoagulation with Eliquis, CKD stage IIIb, history of breast cancer status post chemoradiation in 2008, who was admitted to the hospital 03/15/2022 with new onset seizures she had known meningioma, which was found to have increased in size on CT with surrounding edema.  Patient underwent bilateral formal craniotomy for meningioma resection on 03/24/2020.  She has had a prolonged hospitalization with minimal improvement neurologically.  Palliative care was consulted to address goals.    Subjective: Chart reviewed including labs, progress notes, imaging. Worsening oxygen status- lung infiltrates. Patient continues to remain unresponsive. Does not open her eyes on arousal, or squeeze my hand. Her sisters are at bedside. No immediate family at bedside.   Review of Systems  Unable to perform ROS: Mental status change     Physical Exam Vitals and nursing note reviewed.  Constitutional:      Appearance: She is ill-appearing.  Cardiovascular:     Rate and Rhythm: Normal rate.  Pulmonary:     Comments: Increased oxygen demands Neurological:     Comments: unresponsive             Vital Signs: BP (!) 87/58 (BP Location: Left Wrist)   Pulse 96   Temp 98 F (36.7 C) (Oral)   Resp 19   Ht '4\' 9"'$  (1.448 m)   Wt 82.4 kg   SpO2 93%   BMI 39.31 kg/m  SpO2: SpO2: 93 % O2  Device: O2 Device: Nasal Cannula O2 Flow Rate: O2 Flow Rate (L/min): 10 L/min  Intake/output summary:  Intake/Output Summary (Last 24 hours) at 04/15/2022 1238 Last data filed at 04/15/2022 0742 Gross per 24 hour  Intake 346.4 ml  Output 450 ml  Net -103.6 ml   LBM: Last BM Date : 04/14/22 Baseline Weight: Weight: 85 kg Most recent weight: Weight: 82.4 kg       Palliative Assessment/Data: PPS: 10%      Patient Active Problem  List   Diagnosis Date Noted   Palliative care encounter    Acute respiratory failure with hypoxia ([place])    Aspiration into airway    Pressure injury of skin 04/07/2022   Thrombocytopenia (HCC)    Meningioma ([place]) 03/24/2022   Seizure ([place]) 03/15/2022   Atrial fibrillation, chronic ([place]) 03/15/2022   Protein-calorie malnutrition, moderate ([place]) 03/15/2022   Atrial fibrillation with RVR (HCC)    Metabolic acidosis 62/11/5595   Acute renal failure superimposed on stage 3b chronic kidney disease ([place]) 02/27/2022   Anxiety and depression 02/27/2022   Meningioma, cerebral ([place]) 02/27/2022   Normocytic anemia 02/27/2022   Acute renal failure (ARF) ([place]) 02/27/2022   Hemorrhoids 01/04/2021   Ankle deformity, right 09/12/2020   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   Port-A-Cath in place 09/28/2018   Colon adenomas 08/06/2018   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 ([place]) 06/09/2018   Hypertension 01/25/2018   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   Demand ischemia (HCC)    Prolonged Q-T interval on ECG 09/29/2016   Chronic kidney disease (CKD), stage III (moderate) (HCC) 09/29/2016   Leukocytosis 09/18/2016   Abnormal CT scan, chest    Upper airway cough syndrome  vs Cough variant asthma  04/09/2016   Seasonal allergies 08/08/2014   Family history of colon cancer 03/30/2014   Osteoporosis 41/63/8453   Metabolic syndrome X 64/68/0321   SVT (supraventricular tachycardia) ([place]) 22/48/2500   Chronic systolic CHF (congestive  heart failure) ([place]) 03/14/2013   Vitamin D deficiency 07/23/2010   Morbid obesity ([place]) 07/04/2009   Malignant neoplasm of right breast ([place]) 12/13/2007   Hyperlipidemia 12/13/2007   Anxiety state 12/13/2007   Depression, major, single episode, in partial remission ([place]) 12/13/2007   GERD 12/13/2007    Palliative Care Assessment & Plan    Assessment/Recommendations/Plan  Per chart review- cause for her mental status changes remain unclear- MRI results pending Worsening respiratory status Neurosurgeon and hospitalist spoke with family yesterday PMT will followup with family after MRI results  Addendum- MRI resulted with critical findings- scattered acute and subacute infarcts in bilateral cerebral hemispheres and cerebellum- embolic suspected; increased frontal lobe edema postop; subdural hemorrhage with possible mass effect; likely late subacute subdural hemorrhage without mass effect or midline shift; trace hemorrhage layering in the L occipital horn- RN has called Dr. Lacy Duverney office- PMT will followup with family after they have been updated by attending     Code Status: Full code  Prognosis:  Unable to determine  Discharge Planning: To Be Determined   Thank you for allowing the Palliative Medicine Team to assist in the care of this patient.   Greater than 50%  of this time was spent counseling and coordinating care related to the above assessment and plan.  Mariana Kaufman, AGNP-C Palliative Medicine   Please contact Palliative Medicine Team phone at [phone] for questions and concerns.

## 2022-04-15 NOTE — Progress Notes (Signed)
Patient ID: Tara [name], female   DOB: 1946-03-04, 76 y.o.   MRN: [id number] BP (!) 93/59 (BP Location: Left Wrist)   Pulse 90   Temp 98.3 F (36.8 C) (Oral)   Resp 20   Ht '4\' 9"'$  (1.448 m)   Wt 82.4 kg   SpO2 90%   BMI 39.31 kg/m  Findings noted on MRI. Believe she will improve with more time. None of these embolic events will cause cessation of cortical function. Continue supportive care.  Perrl, not following commands, mute Purposeful and localizes with upper extremities.

## 2022-04-15 NOTE — Progress Notes (Signed)
PROGRESS NOTE    Tara [name]  ONG:[id number] DOB: 06-Oct-1945 DOA: 03/15/2022 PCP: [name], MD   Brief Narrative:  Patient is a 76 year old African-American female currently with past medical history significant for but not limited to anxiety, depression, diabetes mellitus type 2, hypertension, hyperlipidemia, cardiomyopathy, history of atrial fibrillation on anticoagulation with Eliquis, chronic kidney disease stage IIIb, history of breast cancer status post chemoradiation in 2008 as well as other comorbidities who presented to the hospital on 03/15/2022 with new onset seizures.  She had a known sphenoidal meningioma that was noted to be increased in size on the CT scan with surrounding edema so she is transferred from [facility] for further evaluation.  She underwent LTM with no further seizures and was evaluated by neurosurgery and underwent bifrontal craniotomy for meningioma resection on 03/24/2020 and was transferred to the intensive care unit postoperatively with PCCM consulting while she was in ICU.  Current clinical course and significant Hospital events include the following but not limited to:     Significant Hospital Events: Including procedures, antibiotic start and stop dates in addition to other pertinent events   6/24 presented to [facility] ED with new onset seizures, CTH with enlarging meningioma with edema, loaded with Keppra 7/3 Underwent bifrontal craniotomy for meningioma, monitor in ICU 7/5 stable, no further seizure activity 7/6 less responsive, two episodes of vomiting overnight and increased oral secretions today.  Continuous blinking on exam. CTH yesterday with new 7 mm thick subdural hematoma at the foramen magnum and tracking adjacent to the basion and odontoid not currently causing critical stenosis of the foramen magnum per radiology read, but merits surveillance. 7/7 Better night and more awake and responsive, repeat head CT unchanged and no seizures on video  EEG 7/9 Recurrent seizure overnight requiring '4mg'$  of IV ativan to break. This am less responsive with forced right gaze  7/10 no further seizure activity, awake, responding, no forced gaze 7/11 recurrent sz like activity. Not correlated on EEG. CT unchanged.     Assessment and Plan:  Planum Sphenoidal meningioma, enlarging with new vasogenic edema and new onset seizures History is consistent with tonic-clonic seizure with no previous history of seizures Neurosurgery is following and she is status post bifrontal craniotomy for meningioma resection Continue Levetiracetam 1000 mg per Tube BID and Lacosamide 200 mg per Tube BID per neurology recommendations Seizure precautions with PRN benzodiazepines Repeat CT head unremarkable Repeat MRI brain, please see report Patient is 2 mg of IV lorazepam every 10 minutes as needed Continue PT/OT evaluation and they are recommend SNF  Sepsis in setting of HCAP Vs aspiration PNA Acute hypoxic respiratory failure- On 10L of HFNC Febrile on 7/23 on 100.9 BC x2 NGTD, repeat BC X 2 NGTD  SARS COVID-negative UA unremarkable for infection, UC showing multiple species Chest x-ray done and showed "New hazy opacities projecting over both upper lobes in the suprahilar regions could reflect developing infection in the correct clinical setting CT chest, abdomen/pelvis, showed no PE, multifocal groundglass opacity likely pulmonary edema CT abdomen pelvis with no evidence of hemorrhage PCCM rec switching to IV Unasyn Continue IV Lasix Continue DuoNebs, nebulizers PCCM re-consulted, appreciate recs Continue to hold tube feeds Continue supplemental oxygen as needed   ABLA Iron deficiency anemia Acute on chronic thrombocytopenia Hemoglobin dropped to 6.5 on 7/22 Type and screen Transfused 2 units of PRBC on 7/22 Anemia panel iron 15, sats 8, ferritin 440, vitamin B12-->356 Continue monitor for signs and symptoms bleeding; no overt bleeding noted  Daily  CBC  Hypokalemia Replace as needed   Anxiety and Depression Continue with sertraline 25 mg per tube daily    HTN 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 per tube daily, and Rosuvastatin 20 mg per tube daily   Type 2 DM Continue SSI, Accu-Cheks, hypoglycemic protocol   GERD/GI prophylaxis Continue pantoprazole 40 mg per tube daily   Dysphagia Hold tube feeds for now- pending improvement in resp status On Glucerna 1.5 at 1000 mL per tube at 40 MLS per hour every 24 hours as well as Prosource tube feeding liquid 45 MLS per tube twice daily  Nutrition Status: Nutrition Problem: Inadequate oral intake Etiology: inability to eat Signs/Symptoms: NPO status Interventions: Tube feeding, MVI SLP evaluated and recommending n.p.o.  Obesity Lifestyle modification advised -Estimated body mass index is 39.31 kg/m as calculated from the following:   Height as of this encounter: '4\' 9"'$  (1.448 m).   Weight as of this encounter: 82.4 kg.   GOC discussion Due to multiple comorbidities, minimally responsive state, overall poor prognosis Palliative care consulted for further goals of care discussion     DVT prophylaxis: SCDs Start: 03/26/22 0840 Place TED hose Start: 03/24/22 0616 Place and maintain sequential compression device Start: 03/23/22 0631    Code Status: Full Code Family Communication: Discussed with daughter over the phone on 04/14/22  Disposition Plan:  Level of care: Progressive Status is: Inpatient Remains inpatient appropriate because: Minimally responsive and will need SNF once stable for discharge   Consultants:   PCCM Neurosurgery Neurology Palliative  Procedures:  See as above  Antimicrobials:  Anti-infectives (From admission, onward)    Start     Dose/Rate Route Frequency Ordered Stop   04/12/22 1500  Ampicillin-Sulbactam (UNASYN) 3 g in sodium chloride 0.9 % 100 mL IVPB        3 g 200 mL/hr over 30 Minutes Intravenous Every 8 hours 04/12/22 1409     04/11/22 1845  Ampicillin-Sulbactam (UNASYN) 3 g in sodium chloride 0.9 % 100 mL IVPB  Status:  Discontinued        3 g 200 mL/hr over 30 Minutes Intravenous Every 8 hours 04/11/22 1754 04/12/22 1409   04/10/22 1200  vancomycin (VANCOREADY) IVPB 750 mg/150 mL  Status:  Discontinued        750 mg 150 mL/hr over 60 Minutes Intravenous Every 24 hours 04/09/22 1034 04/11/22 1100   04/10/22 0900  metroNIDAZOLE (FLAGYL) IVPB 500 mg  Status:  Discontinued        500 mg 100 mL/hr over 60 Minutes Intravenous Every 12 hours 04/10/22 0754 04/11/22 1754   04/09/22 2300  ceFEPIme (MAXIPIME) 2 g in sodium chloride 0.9 % 100 mL IVPB  Status:  Discontinued        2 g 200 mL/hr over 30 Minutes Intravenous Every 12 hours 04/09/22 1035 04/11/22 1717   04/09/22 1045  vancomycin (VANCOREADY) IVPB 1750 mg/350 mL        1,750 mg 175 mL/hr over 120 Minutes Intravenous  Once 04/09/22 0953 04/09/22 1344   04/09/22 1045  ceFEPIme (MAXIPIME) 2 g in sodium chloride 0.9 % 100 mL IVPB        2 g 200 mL/hr over 30 Minutes Intravenous  Once 04/09/22  7616 04/09/22 1524   03/27/22 0945  Ampicillin-Sulbactam (UNASYN) 3 g in sodium chloride 0.9 % 100 mL IVPB        3 g 200 mL/hr over 30 Minutes Intravenous Every 6 hours 03/27/22 0859 04/01/22 0518   03/25/22 0600  ceFAZolin (ANCEF) IVPB 2g/100 mL premix        2 g 200 mL/hr over 30 Minutes Intravenous On call to O.R. 03/24/22 1215 03/24/22 1440       Subjective: Patient seen and examined at bedside. Mental status unchanged   Objective: Vitals:   04/15/22 0741 04/15/22 0815 04/15/22 1157 04/15/22 1505  BP: 105/67   (!) 87/58 (!) 93/59  Pulse: 91  96 90  Resp: '19  19 20  '$ Temp: (!) 97.5 F (36.4 C)  98 F (36.7 C) 98.3 F (36.8 C)  TempSrc: Oral  Oral Oral  SpO2: 93% 93% 93% 90%  Weight:      Height:        Intake/Output Summary (Last 24 hours) at 04/15/2022 1832 Last data filed at 04/15/2022 1738 Gross per 24 hour  Intake 300.67 ml  Output 1250 ml  Net -949.33 ml   Filed Weights   04/12/22 1901 04/13/22 0500 04/14/22 0500  Weight: 76.4 kg 79 kg 82.4 kg   Examination: Physical Exam: General: NAD, minimally responsive, noted scar across forehead Cardiovascular: S1, S2 present Respiratory: Rhonchi noted bilaterally Abdomen: Soft, nontender, nondistended, bowel sounds present Musculoskeletal: No bilateral pedal edema noted Skin: Normal Psychiatry: Unable to assess Neurology: Unable to follow commands   Data Reviewed: I have personally reviewed following labs and imaging studies  CBC: Recent Labs  Lab 04/12/22 0422 04/12/22 1801 04/13/22 0212 04/14/22 0527 04/15/22 0409  WBC 5.6 6.4 5.9 5.8 5.5  NEUTROABS 4.9 5.4 5.0 5.0 4.3  HGB 6.5* 10.5* 10.6* 10.4* 9.8*  HCT 19.8* 30.8* 32.3* 31.6* 29.3*  MCV 85.7 82.4 83.2 84.0 82.8  PLT 127* 113* 135* 150 073   Basic Metabolic Panel: Recent Labs  Lab 04/09/22 0221 04/10/22 0422 04/12/22 0422 04/13/22 0212 04/14/22 0527 04/14/22 1935 04/15/22 0409  NA 135   < > 140 144 147* 145 143  K 4.0   < > 3.0* 3.8 3.4* 4.0 3.4*  CL 101   < > 105 110 113* 113* 110  CO2 21*   < > '25 24 22 22 23  '$ GLUCOSE 115*   < > 132* 108* 106* 87 93  BUN 26*   < > 40* 31* 28* 27* 27*  CREATININE 0.75   < > 0.95 0.90 0.95 0.98 1.02*  CALCIUM 8.1*   < > 8.2* 8.4* 8.3* 8.3* 8.1*  MG 2.0  --  2.2  --   --   --  2.0  PHOS 2.8  --   --   --   --   --   --    < > = values in this interval not displayed.   GFR: Estimated Creatinine Clearance: 41.6 mL/min (A) (by C-G formula based on SCr of 1.02 mg/dL (H)). Liver Function Tests: Recent Labs  Lab  04/09/22 0221  AST 26  ALT 36  ALKPHOS 68  BILITOT 0.5  PROT 5.3*  ALBUMIN 2.0*   No results for input(s): "LIPASE", "AMYLASE" in the last 168 hours. No results for input(s): "AMMONIA" in the last 168 hours. Coagulation Profile: No results for input(s): "INR", "PROTIME" in the last 168 hours. Cardiac Enzymes: No results for input(s): "CKTOTAL", "CKMB", "CKMBINDEX", "TROPONINI" in the last  168 hours. BNP (last 3 results) No results for input(s): "PROBNP" in the last 8760 hours. HbA1C: No results for input(s): "HGBA1C" in the last 72 hours. CBG: Recent Labs  Lab 04/14/22 2304 04/15/22 0405 04/15/22 0740 04/15/22 1156 04/15/22 1503  GLUCAP 115* 88 86 103* 89   Lipid Profile: No results for input(s): "CHOL", "HDL", "LDLCALC", "TRIG", "CHOLHDL", "LDLDIRECT" in the last 72 hours. Thyroid Function Tests: No results for input(s): "TSH", "T4TOTAL", "FREET4", "T3FREE", "THYROIDAB" in the last 72 hours. Anemia Panel: No results for input(s): "VITAMINB12", "FOLATE", "FERRITIN", "TIBC", "IRON", "RETICCTPCT" in the last 72 hours.  Sepsis Labs: No results for input(s): "PROCALCITON", "LATICACIDVEN" in the last 168 hours.  Recent Results (from the past 240 hour(s))  Urine Culture     Status: Abnormal   Collection Time: 04/08/22  8:40 AM   Specimen: Urine, Clean Catch  Result Value Ref Range Status   Specimen Description URINE, CLEAN CATCH  Final   Special Requests   Final    NONE Performed at [facility], [address], [place], [place] [address]    Culture MULTIPLE SPECIES PRESENT, SUGGEST RECOLLECTION (A)  Final   Report Status 04/09/2022 FINAL  Final  Culture, blood (Routine X 2) w Reflex to ID Panel     Status: None   Collection Time: 04/08/22 10:08 AM   Specimen: BLOOD  Result Value Ref Range Status   Specimen Description BLOOD LEFT ANTECUBITAL  Final   Special Requests   Final    BOTTLES DRAWN AEROBIC AND ANAEROBIC Blood Culture adequate volume   Culture    Final    NO GROWTH 5 DAYS Performed at [facility], [place] [address], [place], [place] [address]    Report Status 04/13/2022 FINAL  Final  Culture, blood (Routine X 2) w Reflex to ID Panel     Status: None   Collection Time: 04/08/22 10:12 AM   Specimen: BLOOD LEFT FOREARM  Result Value Ref Range Status   Specimen Description BLOOD LEFT FOREARM  Final   Special Requests   Final    BOTTLES DRAWN AEROBIC AND ANAEROBIC Blood Culture adequate volume   Culture   Final    NO GROWTH 5 DAYS Performed at [facility], [place] [address] [place], [place] [address]    Report Status 04/13/2022 FINAL  Final  MRSA Next Gen by PCR, Nasal     Status: None   Collection Time: 04/10/22 11:58 AM   Specimen: Nasal Mucosa; Nasal Swab  Result Value Ref Range Status   MRSA by PCR Next Gen NOT DETECTED NOT DETECTED Final    Comment: (NOTE) The GeneXpert MRSA Assay (FDA approved for NASAL specimens only), is one component of a comprehensive MRSA colonization surveillance program. It is not intended to diagnose MRSA infection nor to guide or monitor treatment for MRSA infections. Test performance is not FDA approved in patients less than 33 years old. Performed at [facility], [place] [address] [place], [place] [address]   Culture, blood (Routine X 2) w Reflex to ID Panel     Status: None (Preliminary result)   Collection Time: 04/14/22  5:27 AM   Specimen: BLOOD LEFT HAND  Result Value Ref Range Status   Specimen Description BLOOD LEFT HAND  Final   Special Requests   Final    BOTTLES DRAWN AEROBIC ONLY Blood Culture results may not be optimal due to an inadequate volume of blood received in culture bottles   Culture  Final    NO GROWTH 1 DAY Performed at [facility], [place] [address], [place], [place] [address]    Report Status PENDING  Incomplete  Culture, blood (Routine X 2) w Reflex to ID Panel     Status: None (Preliminary result)   Collection Time: 04/14/22  5:51 AM    Specimen: BLOOD LEFT HAND  Result Value Ref Range Status   Specimen Description BLOOD LEFT HAND  Final   Special Requests   Final    BOTTLES DRAWN AEROBIC AND ANAEROBIC Blood Culture adequate volume   Culture   Final    NO GROWTH 1 DAY Performed at [facility], [place] [address], [place], [place] [address]    Report Status PENDING  Incomplete  SARS Coronavirus 2 by RT PCR (hospital order, performed in [facility] hospital lab) *cepheid single result test* Anterior Nasal Swab     Status: None   Collection Time: 04/14/22  1:12 PM   Specimen: Anterior Nasal Swab  Result Value Ref Range Status   SARS Coronavirus 2 by RT PCR NEGATIVE NEGATIVE Final    Comment: (NOTE) SARS-CoV-2 target nucleic acids are NOT DETECTED.  The SARS-CoV-2 RNA is generally detectable in upper and lower respiratory specimens during the acute phase of infection. The lowest concentration of SARS-CoV-2 viral copies this assay can detect is 250 copies / mL. A negative result does not preclude SARS-CoV-2 infection and should not be used as the sole basis for treatment or other patient management decisions.  A negative result may occur with improper specimen collection / handling, submission of specimen other than nasopharyngeal swab, presence of viral mutation(s) within the areas targeted by this assay, and inadequate number of viral copies (<250 copies / mL). A negative result must be combined with clinical observations, patient history, and epidemiological information.  Fact Sheet for Patients:   https://www.patel.info/  Fact Sheet for Healthcare Providers: https://hall.com/  This test is not yet approved or  cleared by the [place] FDA and has been authorized for detection and/or diagnosis of SARS-CoV-2 by FDA under an Emergency Use Authorization (EUA).  This EUA will remain in effect (meaning this test can be used) for the duration of the COVID-19  declaration under Section 564(b)(1) of the Act, 21 U.S.C. section 360bbb-3(b)(1), unless the authorization is terminated or revoked sooner.  Performed at [facility], [place] [address] [place], [place] [address]      Radiology Studies: MR BRAIN WO CONTRAST  Result Date: 04/15/2022 CLINICAL DATA:  Meningioma, lethargic EXAM: MRI HEAD WITHOUT CONTRAST TECHNIQUE: Multiplanar, multiecho pulse sequences of the brain and surrounding structures were obtained without intravenous contrast. COMPARISON:  03/15/2022 correlation is also made with CT head 04/09/2022 FINDINGS: Brain: Status post frontal craniotomy for meningioma resection. Fluid and hemorrhage noted in the resection cavity and subjacent to the craniotomy flap. Edema is noted in the adjacent frontal lobes, which appears similar to the preoperative exam on the left and slightly increased on the right. Evaluation for residual meningioma is limited by the absence of intravenous contrast. Restricted diffusion with ADC correlates in the left cerebellum (series 2, images 15-18), bilateral occipital lobes (series 2, images 25 and 27), left parietal lobe (series 2, image 30), right frontal lobe (series 2, images 22-23), and left frontal lobe (series 2, image 42). Additional possible foci of increased signal on diffusion-weighted imaging do not demonstrate likely ADC correlates. T1 and T2 hyperintense subdural collections along the parieto-occipital convexity bilaterally, measuring up to  4 mm (series 6, image 15), likely late subacute blood products. Additional smaller areas of subdural hemorrhage are seen along the left frontal convexity (series 6, images 11-13), where there is also a small amount of subarachnoid hemorrhage (series 7, image 14 and series 8, image 15), right temporal convexity (series 7, image 10) and along the falx (series 8, image 21). No significant mass effect. No midline shift. Hemorrhage is also noted extending along the clivus,  through the foramen magnum, and along the anterior aspect of the craniocervical junction, measuring up to 4 mm (series 6, image 3 and series 4, image 16), narrowing the foramen magnum but without abnormal signal in the medulla or spinal cord in the imaged proximal cervical spine. Additional hemosiderin deposition in the left frontal lobe (series 8, image 18) and left temporal lobe (series 8, image 13). Trace hemosiderin deposition in the left occipital horn without evidence of hydrocephalus. Vascular: Patent arterial flow voids. Skull and upper cervical spine: Recent frontal craniotomy. Otherwise normal marrow signal. Sinuses/Orbits: No acute finding. Other: The mastoids are well aerated. IMPRESSION: 1. Small scattered acute and/or subacute infarcts in the bilateral cerebral hemispheres and left cerebellum. Given multiple vascular territories, an embolic etiology is suspected. 2. Postoperative findings of meningioma resection, with slightly increased edema in the right inferior frontal lobe. Evaluation for residual meningioma is limited in the absence of intravenous contrast. 3. Subdural hemorrhage extending inferiorly along the clivus and narrowing the foramen magnum with possible mass effect but without abnormal signal in the medulla or proximal cervical spinal cord. 4. Small extra-axial collections, most likely late subacute subdural hemorrhage, without significant mass effect or midline shift. 5. Trace hemorrhage layering in the left occipital horn. No hydrocephalus. These results will be called to the ordering clinician or representative by the Radiologist Assistant, and communication documented in the PACS or [employer]. Electronically Signed   By: Merilyn Baba M.D.   On: 04/15/2022 12:02   CT ABDOMEN PELVIS W CONTRAST  Result Date: 04/13/2022 CLINICAL DATA:  Sepsis.  Drop in hemoglobin, rule out hemorrhage. EXAM: CT ABDOMEN AND PELVIS WITH CONTRAST TECHNIQUE: Multidetector CT imaging of the abdomen  and pelvis was performed using the standard protocol following bolus administration of intravenous contrast. RADIATION DOSE REDUCTION: This exam was performed according to the departmental dose-optimization program which includes automated exposure control, adjustment of the mA and/or kV according to patient size and/or use of iterative reconstruction technique. CONTRAST:  13m OMNIPAQUE IOHEXOL 350 MG/ML SOLN COMPARISON:  06/18/2016. FINDINGS: Lower chest: The heart is enlarged and there is no pericardial effusion. There are patchy atelectasis or infiltrate at the lung bases. Hepatobiliary: No focal liver abnormality is seen. Status post cholecystectomy. No biliary dilatation. Pancreas: Unremarkable. No pancreatic ductal dilatation or surrounding inflammatory changes. Spleen: Normal in size without focal abnormality. Adrenals/Urinary Tract: No adrenal nodule or mass. A cyst is present in the upper pole the right kidney. No renal calculus or hydronephrosis. The bladder is unremarkable. Stomach/Bowel: An enteric tube terminates in the proximal duodenum. There is a small paraesophageal hiatal hernia. No bowel obstruction, free air, or pneumatosis. The appendix is not visualized on exam. Vascular/Lymphatic: Aortic atherosclerosis. No enlarged abdominal or pelvic lymph nodes. Reproductive: Status post hysterectomy. No adnexal masses. Other: No abdominal wall hernia or abnormality. No abdominopelvic ascites. No focal hemorrhage is seen. Musculoskeletal: Degenerative changes are present in the thoracolumbar spine. No acute osseous abnormality. IMPRESSION: 1. No evidence of hemorrhage. 2. Patchy atelectasis or infiltrate at the lung bases. 3. Paraesophageal  hiatal hernia. 4. Right renal cyst. 5. Aortic atherosclerosis. Electronically Signed   By: Brett Fairy M.D.   On: 04/13/2022 23:36   CT Angio Chest Pulmonary Embolism (PE) W or WO Contrast  Result Date: 04/13/2022 CLINICAL DATA:  Sepsis and anemia EXAM: CT  ANGIOGRAPHY CHEST WITH CONTRAST TECHNIQUE: Multidetector CT imaging of the chest was performed using the standard protocol during bolus administration of intravenous contrast. Multiplanar CT image reconstructions and MIPs were obtained to evaluate the vascular anatomy. RADIATION DOSE REDUCTION: This exam was performed according to the departmental dose-optimization program which includes automated exposure control, adjustment of the mA and/or kV according to patient size and/or use of iterative reconstruction technique. CONTRAST:  169m OMNIPAQUE IOHEXOL 350 MG/ML SOLN COMPARISON:  05/04/2018 FINDINGS: Cardiovascular: Contrast injection is sufficient to demonstrate satisfactory opacification of the pulmonary arteries to the segmental level. There is no pulmonary embolus or evidence of right heart strain. The size of the main pulmonary artery is normal. Mild cardiomegaly. The course and caliber of the aorta are normal. There is mild atherosclerotic calcification. Opacification decreased due to pulmonary arterial phase contrast bolus timing. Mediastinum/Nodes: Calcified right hilar adenopathy. No other mediastinal, hilar or axillary lymphadenopathy. Normal visualized thyroid. Thoracic esophageal course is normal. Lungs/Pleura: Multifocal ground-glass opacity within both lungs, likely pulmonary edema. Small right pleural effusion. Calcified granuloma in the right lung. Upper Abdomen: Contrast bolus timing is not optimized for evaluation of the abdominal organs. The visualized portions of the organs of the upper abdomen are normal. Musculoskeletal: No chest wall abnormality. No bony spinal canal stenosis. Review of the MIP images confirms the above findings. IMPRESSION: 1. No pulmonary embolus or acute aortic syndrome. 2. Multifocal ground-glass opacity within both lungs, likely pulmonary edema. Small right pleural effusion. 3. Calcified granuloma in the right lung with calcified right hilar adenopathy, likely a sequela  of remote granulomatous infection. 4. Aortic Atherosclerosis (ICD10-I70.0). Electronically Signed   By: KUlyses JarredM.D.   On: 04/13/2022 23:29     Scheduled Meds:  amiodarone  200 mg Per Tube Daily   budesonide (PULMICORT) nebulizer solution  0.25 mg Nebulization BID   Chlorhexidine Gluconate Cloth  6 each Topical Daily   feeding supplement (GLUCERNA 1.5 CAL)  1,000 mL Per Tube Q24H   feeding supplement (PROSource TF)  45 mL Per Tube BID   free water  200 mL Per Tube Q6H   furosemide  40 mg Intravenous Daily   insulin aspart  0-15 Units Subcutaneous Q4H   insulin glargine-yfgn  10 Units Subcutaneous Daily   lacosamide  200 mg Per Tube BID   levETIRAcetam  1,000 mg Per Tube BID   montelukast  10 mg Per Tube QHS   multivitamin with minerals  1 tablet Per Tube Daily   mouth rinse  15 mL Mouth Rinse 4 times per day   pantoprazole sodium  40 mg Per Tube Daily   polyethylene glycol  17 g Per NG tube Daily   rosuvastatin  20 mg Per Tube Daily   senna  1 tablet Per Tube BID   sertraline  25 mg Per Tube Daily   Continuous Infusions:  ampicillin-sulbactam (UNASYN) IV 200 mL/hr at 04/15/22 1738     LOS: 31 days   NAlma Friendly MD [facility] Available via Epic secure chat 7am-7pm After these hours, please refer to coverage provider listed on [email or web address] 04/15/2022, 6:32 PM

## 2022-04-15 NOTE — TOC Progression Note (Signed)
Transition of Care [facility]) - Progression Note    Patient Details  Name: Tara [name] MRN: [id number] Date of Birth: August 27, 1946  Transition of Care [facility]) CM/SW [place], [place] Phone Number: 04/15/2022, 2:01 PM  Clinical Narrative:     TOC continues to follow   Expected Discharge Plan: [place] Barriers to Discharge: Continued Medical Work up, Ship broker  Expected Discharge Plan and Services Expected Discharge Plan: [place]   Discharge Planning Services: CM Consult   Living arrangements for the past 2 months: [place]: [place]         Social Determinants of Health (SDOH) Interventions    Readmission Risk Interventions    03/06/2022   12:36 PM  Readmission Risk Prevention Plan  Transportation Screening Complete  HRI or [place] Complete  Social Work Consult for [place] Planning/Counseling Complete

## 2022-04-15 NOTE — Progress Notes (Signed)
NAME:  Tara [name], MRN:  [id number], DOB:  03-Dec-1945, LOS: 59 ADMISSION DATE:  03/15/2022, CONSULTATION DATE:  04/15/22 REFERRING MD:  Christella Noa, CHIEF COMPLAINT:  Meningioma, Seizure   History of Present Illness:  Tara [name] is a 76 y.o. F with PMH significant for Anxiety, Depression, T2DM, HTN, HL, cardiomyopathy, Atrial Fibrillation on Eliquis, CKD IIIb, breast Ca s/p chemoradiation in 2008 who presented to the ED 6/24 with new onset seizures.   She had a known planum sphenoidal meningioma that was increased in size on CT with surrounding edema, so was transferred to [facility] for further evaluation.  LTM showed no further seizures.  She was evaluated by neurosurgery and underwent bifrontal craniotomy for meningioma resection on 7/3 and was transferred to intensive care post-op with PCCM consult while in ICU.   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   Significant Hospital Events: Including procedures, antibiotic start and stop dates in addition to other pertinent events   6/24 presented to [facility] ED with new onset seizures, CTH with enlarging meningioma with edema, loaded with Keppra 7/3 Underwent bifrontal craniotomy for meningioma, monitor in ICU 7/5 stable, no  further seizure activity 7/6 less responsive, two episodes of vomiting overnight and increased oral secretions today.  Continuous blinking on exam. CTH yesterday with new 7 mm thick subdural hematoma at the foramen magnum and tracking adjacent to the basion and odontoid not currently causing critical stenosis of the foramen magnum per radiology read, but merits surveillance. 7/7 Better night and more awake and responsive, repeat head CT unchanged and no seizures on video EEG 7/9 Recurrent seizure overnight requiring '4mg'$  of IV ativan to break. This am less responsive with forced right gaze  7/10 no further seizure activity, awake, responding, no forced gaze 7/11 recurrent sz like activity. Not correlated on EEG. CT unchanged.  7/21 reconsulted for hypoxia and bilateral infiltrates 7/24 Continued increasing oxygen needs. CT Angio Chest >> No PE's, Multifocal ground glass opacity within both lungs, likely pulmonary edema, small right pleural effusion , Calcified granuloma in the right lung with calcified right hilar adenopathy, likely a sequela of remote granulomatous infection. Interim History / Subjective:  Oxygen demands slightly improved today - on 10L HFNC   Objective   Blood pressure 105/67, pulse 91, temperature (!) 97.5 F (36.4 C), temperature source Oral, resp. rate 19, height '4\' 9"'$  (1.448 m), weight 82.4 kg, SpO2 93 %.        Intake/Output Summary (Last 24 hours) at 04/15/2022 0948 Last data filed at 04/15/2022 0742 Gross per 24 hour  Intake 830.58 ml  Output 450 ml  Net 380.58 ml   Filed Weights   04/12/22 1901 04/13/22 0500 04/14/22 0500  Weight: 76.4 kg 79 kg 82.4 kg    General:  Elderly female, NAD on HFNC  HEENT: frontal crani site is c/d/ well approximated. Some L periorbital ecchymosis. +cortrak  Neuro: awake, eyes open, L gaze, Does not follow commands. +  cough  CV: rrr cap refill brisk  PULM: Symmetrical chest expansion, diminished basilar sounds anteriorly. Some upper  lobe rhonchi GI: soft, non tender  Extremities: warm and dry, foot boots, no acute deformity  Skin: pale, c/d/w    Resolved Hospital Problem list   AKI  Assessment & Plan:   Acute respiratory failure with hypoxia Suspected aspiration PNA, BUL infiltrates  Dysphagia  7/24>> Increasing oxygen demands now on 14L HFNC -Doubt amiodarone toxicity -CTA negative for PE and + for bilateral GG infiltrates and pulmonary edema.  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, now s/p bifrontal craniectomy Eliquis stopped due to drop in HGB and facial bruise -Op date 7/21  -post op had seizure like activity but none proven on eeg  P -cont keppra, vimpat -sz precautions, PRN BZD    Thrombocytopenia Acute on chronic anemia  -- hgb 6.5 (7/22) -Think worse anemia is iatrogenic losses & marrow suppression w prolonged hospitalization P -goal > 7  -monitor for s/sx bleed - Trend CBC   HTN Chronic HFpEF pAF, currently sinus  Echo 6/9 with EF 55% and Grade I diastolic dysfunction P -cont amio, coreg, crestor   DM2  Acute hypoglycemia  P -SSI   GERD -PPI  Hypokalemia -replace   GOC Dr. Tacy Learn spoke with family 7/24 regarding the fact the patient is 21 days post op and quality of life is poor. They need to make a decision regarding Code status.  The most pressing issue would be determination of advanced airway in event of further decline / worse resp status.   Palliative care following as well   Best Practice (right click and "Reselect all SmartList Selections" daily)   Diet/type: tubefeeds  DVT prophylaxis: PAS GI prophylaxis: PPI Lines: N/A Foley:  N/A Code Status:  full  code Last date of multidisciplinary goals of care discussion: 7/24  Critical care: NA    Nickolas Madrid, NP Pulmonary/Critical Care Medicine  04/15/2022  9:48 AM

## 2022-04-16 ENCOUNTER — Inpatient Hospital Stay (HOSPITAL_COMMUNITY): Payer: Medicare Other

## 2022-04-16 DIAGNOSIS — R569 Unspecified convulsions: Secondary | ICD-10-CM | POA: Diagnosis not present

## 2022-04-16 DIAGNOSIS — Z7189 Other specified counseling: Secondary | ICD-10-CM | POA: Diagnosis not present

## 2022-04-16 DIAGNOSIS — I639 Cerebral infarction, unspecified: Secondary | ICD-10-CM

## 2022-04-16 DIAGNOSIS — Z515 Encounter for palliative care: Secondary | ICD-10-CM | POA: Diagnosis not present

## 2022-04-16 DIAGNOSIS — T17908D Unspecified foreign body in respiratory tract, part unspecified causing other injury, subsequent encounter: Secondary | ICD-10-CM | POA: Diagnosis not present

## 2022-04-16 LAB — BASIC METABOLIC PANEL
Anion gap: 12 (ref 5–15)
BUN: 29 mg/dL — ABNORMAL HIGH (ref 8–23)
CO2: 23 mmol/L (ref 22–32)
Calcium: 8 mg/dL — ABNORMAL LOW (ref 8.9–10.3)
Chloride: 107 mmol/L (ref 98–111)
Creatinine, Ser: 0.94 mg/dL (ref 0.44–1.00)
GFR, Estimated: >60 mL/min (ref >60)
Glucose, Bld: 142 mg/dL — ABNORMAL HIGH (ref 70–99)
Potassium: 3.4 mmol/L — ABNORMAL LOW (ref 3.5–5.1)
Sodium: 142 mmol/L (ref 135–145)

## 2022-04-16 LAB — CBC WITH DIFFERENTIAL/PLATELET
Abs Immature Granulocytes: 0.28 10*3/uL — ABNORMAL HIGH (ref 0.00–0.07)
Basophils Absolute: 0 10*3/uL (ref 0.0–0.1)
Basophils Relative: 1 %
Eosinophils Absolute: 0.1 10*3/uL (ref 0.0–0.5)
Eosinophils Relative: 1 %
HCT: 30.2 % — ABNORMAL LOW (ref 36.0–46.0)
Hemoglobin: 9.9 g/dL — ABNORMAL LOW (ref 12.0–15.0)
Immature Granulocytes: 5 %
Lymphocytes Relative: 10 %
Lymphs Abs: 0.5 10*3/uL — ABNORMAL LOW (ref 0.7–4.0)
MCH: 27.6 pg (ref 26.0–34.0)
MCHC: 32.8 g/dL (ref 30.0–36.0)
MCV: 84.1 fL (ref 80.0–100.0)
Monocytes Absolute: 0.3 10*3/uL (ref 0.1–1.0)
Monocytes Relative: 5 %
Neutro Abs: 4.3 10*3/uL (ref 1.7–7.7)
Neutrophils Relative %: 78 %
Platelets: 163 10*3/uL (ref 150–400)
RBC: 3.59 MIL/uL — ABNORMAL LOW (ref 3.87–5.11)
RDW: 18.6 % — ABNORMAL HIGH (ref 11.5–15.5)
WBC: 5.4 10*3/uL (ref 4.0–10.5)
nRBC: 0 % (ref 0.0–0.2)

## 2022-04-16 LAB — GLUCOSE, CAPILLARY
Glucose-Capillary: 116 mg/dL — ABNORMAL HIGH (ref 70–99)
Glucose-Capillary: 140 mg/dL — ABNORMAL HIGH (ref 70–99)
Glucose-Capillary: 149 mg/dL — ABNORMAL HIGH (ref 70–99)
Glucose-Capillary: 159 mg/dL — ABNORMAL HIGH (ref 70–99)
Glucose-Capillary: 66 mg/dL — ABNORMAL LOW (ref 70–99)
Glucose-Capillary: 91 mg/dL (ref 70–99)
Glucose-Capillary: 98 mg/dL (ref 70–99)

## 2022-04-16 MED ORDER — HYDROMORPHONE HCL 1 MG/ML IJ SOLN
INTRAMUSCULAR | Status: AC
  Filled 2022-04-16: qty 1

## 2022-04-16 MED ORDER — APIXABAN 2.5 MG PO TABS
2.5000 mg | ORAL_TABLET | Freq: Two times a day (BID) | ORAL | Status: DC
  Administered 2022-04-16 – 2022-04-17 (×3): 2.5 mg via ORAL
  Filled 2022-04-16 (×3): qty 1

## 2022-04-16 MED ORDER — POTASSIUM CHLORIDE 20 MEQ PO PACK
40.0000 meq | PACK | Freq: Once | ORAL | Status: AC
  Administered 2022-04-16: 40 meq
  Filled 2022-04-16: qty 2

## 2022-04-16 MED ORDER — DEXTROSE 50 % IV SOLN
12.5000 g | INTRAVENOUS | Status: AC
Start: 2022-04-16 — End: 2022-04-16
  Administered 2022-04-16: 12.5 g via INTRAVENOUS
  Filled 2022-04-16: qty 50

## 2022-04-16 MED ORDER — MIDODRINE HCL 5 MG PO TABS
2.5000 mg | ORAL_TABLET | Freq: Three times a day (TID) | ORAL | Status: DC
  Administered 2022-04-16 – 2022-04-17 (×3): 2.5 mg via ORAL
  Filled 2022-04-16 (×3): qty 1

## 2022-04-16 NOTE — Progress Notes (Signed)
NEUROHOSPITALIST PROGRESS NOTE   Requesting physician: Dr. Christella Noa  Reason for Consult: Neurology re-consulted for evaluation and management of new strokes seen on MRI  History obtained from:  Husband and Chart     HPI:                                                                                                                                          Tara [name] is an 76 y.o. female who is well-known to our service during this hospital stay, seen initially for seizures and now for new strokes on MRI.   Per my 7/2 consult note: "Tara [name] is a 76 y.o. female with past medical history of anxiety, depression, GERD, hypertension, hyperlipidemia, nonischemic cardiomyopathy, atrial fibrillation on Eliquis, Systolic HF, hx of breast cancer, meningioma who presented to the hospital on 03/15/2022 for evaluation of new onset seizures. On the day of presentation she was lip smacking all day and then later on in the day she was found to be having a tonic clonic seizure with whole body stiffening and unresponsiveness. She was seen by neurology on 6/24 and loaded with 4.5 g Keppra followed by Keppra 750 mg (renally dosed) BID and a Vimpat load of 200 mg followed by '100mg'$  BID. Keppra was decreased to '500mg'$  BID on 6/28 due to increasing lethargy. Neurology was re-consulted for concerns of increased seizure activity with lip smacking during the night into this morning. Patient is scheduled to under go surgical resection of meningoma tomorrow."  On 7/3 she underwent bifrontal craniotomy for meningioma. 7/6 CT head revealed a new 7 mm thick subdural hematoma at the foramen magnum and tracking adjacent to the basion and odontoid not causing critical stenosis of the foramen magnum per radiology read. She had some recurrent seizure like activity on 7/11 without EEG correlate.   Dr, Hortense Ramal was following the patient post-operatively for seizure-like activity consisting of repeated chewing and  lip-smacking movements without EEG correlate. Her last assessment was on 7/13. At that time the patient was awake and able to state her name and that she is was the hospital, but did not follow commands and did not name objects. Pupils were equal but she did not track examiner. She was spontaneously moving her upper extremities in bed. Her lip movements were felt to be most likely due to tardive dyskinesia as there was no associated EEG correlate, but focal frontal lobe seizures with focus too deep to be detected by EEG was also on the DDx. She was continued on Keppra 1000 mg BID and Vimpat 200 mg BID.   Most recent EEG (7/11): Continuous slowing consistent with moderate diffuse encephalopathy, nonspecific to etiology. No seizures or epileptiform discharges were seen throughout the recording.  On 7/25 an MRI brain without contrast  was obtained, revealing posterior fossa SDH and postoperative findings of meningioma resection with slightly increased edema in the right inferior frontal lobe and.increasing mass effect, as well as subacute/acute small scattered infarcts. Neurology was reconsulted on 7/26 for evaluation and management of her new strokes.   MRI brain:  1. Small scattered acute and/or subacute infarcts in the bilateral cerebral hemispheres and left cerebellum. Given multiple vascular territories, an embolic etiology is suspected. 2. Postoperative findings of meningioma resection, with slightly increased edema in the right inferior frontal lobe. Evaluation for residual meningioma is limited in the absence of intravenous contrast. 3. Subdural hemorrhage extending inferiorly along the clivus and narrowing the foramen magnum with possible mass effect but without abnormal signal in the medulla or proximal cervical spinal cord. 4. Small extra-axial collections, most likely late subacute subdural hemorrhage, without significant mass effect or midline shift. 5. Trace hemorrhage layering in the  left occipital horn. No hydrocephalus.   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   Anxiety    Breast cancer ([place]) 2008   right - s/p lumpectomy->chemo, radiation   Depression    Dysrhythmia    hx SVT   Family history of colon cancer    Family history of prostate cancer    GERD (gastroesophageal reflux disease)    H/O: hysterectomy    Hiatal hernia    History of cancer chemotherapy    History of radiation therapy    Hyperlipidemia    Hypertension 01/25/2018   Nonischemic cardiomyopathy ([place])    Personal history of radiation therapy 01/05/209   SVT (supraventricular tachycardia) ([place])    short RP SVT documented 5/05   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[place])    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)   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    Past Surgical History:  Procedure Laterality Date   ABDOMINAL HYSTERECTOMY  1994   fibroids,    BIOPSY  06/19/2016   Procedure: BIOPSY;  Surgeon: Daneil Dolin, MD;  [facility];  Service: Endoscopy;;  gastric duodenum   BREAST BIOPSY Right 12/16/2006   malignant   BREAST EXCISIONAL BIOPSY Right 2018   benign lumpectomy   BREAST LUMPECTOMY Right 02/2007   BREAST LUMPECTOMY WITH RADIOACTIVE SEED LOCALIZATION Right 07/29/2017   Procedure: RIGHT BREAST LUMPECTOMY WITH RADIOACTIVE SEED LOCALIZATION;  Surgeon: Fanny Skates, MD;  Location: [place];  Service: General;  Laterality: Right;   BREAST SURGERY Right 2008   lumpectomy, cancer   CARDIAC CATHETERIZATION     CHOLECYSTECTOMY  1999   COLONOSCOPY  2008   Dr. Oneida Alar: multiple polyps. Path not available at time of visit.    COLONOSCOPY WITH PROPOFOL N/A 04/25/2014   Dr. Oneida Alar: Multiple tubular adenomas removed. Diverticulosis. Moderate internal hemorrhoids. Next colonoscopy planned for August 2018.   CRANIOTOMY Bilateral 03/24/2022   Procedure: Bifrontal  craniotomy for resection of meningioma;  Surgeon: Ashok Pall, MD;  Location: [facility];  Service: Neurosurgery;  Laterality: Bilateral;   ESOPHAGOGASTRODUODENOSCOPY (EGD) WITH PROPOFOL N/A 06/19/2016   Procedure: ESOPHAGOGASTRODUODENOSCOPY (EGD) WITH PROPOFOL;  Surgeon: Daneil Dolin, MD;  [facility];  Service: Endoscopy;  Laterality: N/A;   MASTECTOMY W/ SENTINEL NODE BIOPSY Right 06/09/2018   Procedure: RIGHT TOTAL MASTECTOMY WITH SENTINEL LYMPH NODE BIOPSY;  Surgeon: Fanny Skates, MD;  Location: [facility];  Service: General;  Laterality:  Right;   POLYPECTOMY N/A 04/25/2014   Procedure: POLYPECTOMY;  Surgeon: Danie Binder, MD;  Location: [facility];  Service: Endoscopy;  Laterality: N/A;  Ascending and Decending Colon x3 , Transverse colon x2, rectal   PORTACATH PLACEMENT N/A 06/09/2018   Procedure: INSERTION PORT-A-CATH;  Surgeon: Fanny Skates, MD;  Location: [facility];  Service: General;  Laterality: N/A;   RIGHT/LEFT HEART CATH AND CORONARY ANGIOGRAPHY N/A 07/10/2017   Procedure: RIGHT/LEFT HEART CATH AND CORONARY ANGIOGRAPHY;  Surgeon: Larey Dresser, MD;  Location: [facility];  Service: Cardiovascular;  Laterality: N/A;    Family History  Problem Relation Age of Onset   Colon cancer Father 19       died at age 70   Hypertension Sister    Heart disease Sister    Cancer Sister        unknown form   Dementia Mother    Stroke Mother 82       left hemiparesis   Cancer Brother 32       prostate   Cancer Paternal Aunt        NOS   Lung cancer Paternal Uncle    Other Paternal Uncle        lightning strike   Other Paternal Uncle        hit by train   Other Paternal 6        old age   60 Cousin        NOS pat first cousin   Cancer Cousin        NOS pat first cousin   Prostate cancer Cousin        pat first cousin            Social History:  reports that she has never smoked. She has never used smokeless tobacco. She reports that she does not drink alcohol  and does not use drugs.  Allergies  Allergen Reactions   Aspirin Other (See Comments)    Reports GI bleed--pt is currently taking   Lisinopril Cough   Sudafed [Pseudoephedrine Hcl]     Pt reports she had drainage in throat that made her throat hurt.     MEDICATIONS:                                                                                                                     Scheduled:  amiodarone  200 mg Per Tube Daily   apixaban  2.5 mg Oral BID   budesonide (PULMICORT) nebulizer solution  0.25 mg Nebulization BID   Chlorhexidine Gluconate Cloth  6 each Topical Daily   feeding supplement (GLUCERNA 1.5 CAL)  1,000 mL Per Tube Q24H   feeding supplement (PROSource TF)  45 mL Per Tube BID   free water  200 mL Per Tube Q6H   furosemide  40 mg Intravenous Daily   insulin aspart  0-15 Units Subcutaneous Q4H   insulin glargine-yfgn  10 Units Subcutaneous Daily   lacosamide  200 mg  Per Tube BID   levETIRAcetam  1,000 mg Per Tube BID   midodrine  2.5 mg Oral Q8H   montelukast  10 mg Per Tube QHS   multivitamin with minerals  1 tablet Per Tube Daily   mouth rinse  15 mL Mouth Rinse 4 times per day   pantoprazole sodium  40 mg Per Tube Daily   polyethylene glycol  17 g Per NG tube Daily   rosuvastatin  20 mg Per Tube Daily   saccharomyces boulardii  500 mg Per Tube BID   senna  1 tablet Per Tube BID   sertraline  25 mg Per Tube Daily   Continuous:  ampicillin-sulbactam (UNASYN) IV 3 g (04/17/22 0612)     ROS:                                                                                                                                       Unable to obtain due to AMS   Blood pressure 98/64, pulse 90, temperature 98 F (36.7 C), temperature source Oral, resp. rate (!) 24, height '4\' 9"'$  (1.448 m), weight 82.4 kg, SpO2 90 %.   General Examination:                                                                                                       HEENT: Transversely  oriented staples at incision site along vertex of the skull from recent operative procedure. Wound is C/D/I.  Lungs: Respirations unlabored Ext: No edema  Mental Status:  Not on any sedation at time of exam. Obtunded. Slight eyelid fluttering in response to noxious stimuli, otherwise no eye opening. Does not alert to voice or loud clap. No attempts to communicate. Moves upper extremities slightly and nonpurposefully to noxious stimuli. Cranial Nerves:  II: PERRL No blink to threat bilaterally.  III,IV, VI: No ptosis. EOMI.  V: Weakly blinks to eyelid stimulation bilaterally  VII: Purses lips intermittently in stereotyped fashion without asymmetry VIII: Does not respond to voice IX,X: Does not vocalize XI: Head is midline XII: Does not open mouth or protrude tongue Motor: Increased flexor tone to BUE, worse on the right. Does not move arms to repeated light pinch except for mild low amplitude tremulous arm movements. Does not posture or move upper extremities purposefully or nonpurposefully to any noxious stimuli.  BLE with moderately increased extensor tone. Does not withdraw either lower extremity to repeated light pinching.   Sensory: Reactions to noxious as above.  Deep Tendon  Reflexes: 1+ bilateral brachioradialis. Trace bilateral patellae Cerebellar: Unable to assess  Gait: Unable to assess    Lab Results: Basic Metabolic Panel: Recent Labs  Lab 04/12/22 0422 04/13/22 0212 04/14/22 0527 04/14/22 1935 04/15/22 0409 04/16/22 0429  NA 140 144 147* 145 143 142  K 3.0* 3.8 3.4* 4.0 3.4* 3.4*  CL 105 110 113* 113* 110 107  CO2 '25 24 22 22 23 23  '$ GLUCOSE 132* 108* 106* 87 93 142*  BUN 40* 31* 28* 27* 27* 29*  CREATININE 0.95 0.90 0.95 0.98 1.02* 0.94  CALCIUM 8.2* 8.4* 8.3* 8.3* 8.1* 8.0*  MG 2.2  --   --   --  2.0  --     CBC: Recent Labs  Lab 04/12/22 1801 04/13/22 0212 04/14/22 0527 04/15/22 0409 04/16/22 0429  WBC 6.4 5.9 5.8 5.5 5.4  NEUTROABS 5.4 5.0 5.0 4.3  4.3  HGB 10.5* 10.6* 10.4* 9.8* 9.9*  HCT 30.8* 32.3* 31.6* 29.3* 30.2*  MCV 82.4 83.2 84.0 82.8 84.1  PLT 113* 135* 150 160 163    Cardiac Enzymes: No results for input(s): "CKTOTAL", "CKMB", "CKMBINDEX", "TROPONINI" in the last 168 hours.  Lipid Panel: No results for input(s): "CHOL", "TRIG", "HDL", "CHOLHDL", "VLDL", "LDLCALC" in the last 168 hours.  Imaging: DG CHEST PORT 1 VIEW  Result Date: 04/16/2022 CLINICAL DATA:  Shortness of breath EXAM: PORTABLE CHEST 1 VIEW COMPARISON:  CT April 13, 2022 and chest radiograph April 11, 2022 FINDINGS: Enteric feeding catheter courses below the diaphragm with tip obscured by collimation. Right axillary surgical clips. Cholecystectomy clips. Similar cardiac enlargement with central vascular prominence. Worsened bilateral interstitial and airspace opacities. No visible pleural effusion or pneumothorax. No acute osseous abnormality. IMPRESSION: Similar cardiac enlargement and central vascular prominence with worsened bilateral interstitial and airspace opacities favored to reflect pulmonary edema. Electronically Signed   By: Dahlia Bailiff M.D.   On: 04/16/2022 14:50   MR BRAIN WO CONTRAST  Result Date: 04/15/2022 CLINICAL DATA:  Meningioma, lethargic EXAM: MRI HEAD WITHOUT CONTRAST TECHNIQUE: Multiplanar, multiecho pulse sequences of the brain and surrounding structures were obtained without intravenous contrast. COMPARISON:  03/15/2022 correlation is also made with CT head 04/09/2022 FINDINGS: Brain: Status post frontal craniotomy for meningioma resection. Fluid and hemorrhage noted in the resection cavity and subjacent to the craniotomy flap. Edema is noted in the adjacent frontal lobes, which appears similar to the preoperative exam on the left and slightly increased on the right. Evaluation for residual meningioma is limited by the absence of intravenous contrast. Restricted diffusion with ADC correlates in the left cerebellum (series 2, images 15-18),  bilateral occipital lobes (series 2, images 25 and 27), left parietal lobe (series 2, image 30), right frontal lobe (series 2, images 22-23), and left frontal lobe (series 2, image 42). Additional possible foci of increased signal on diffusion-weighted imaging do not demonstrate likely ADC correlates. T1 and T2 hyperintense subdural collections along the parieto-occipital convexity bilaterally, measuring up to 4 mm (series 6, image 15), likely late subacute blood products. Additional smaller areas of subdural hemorrhage are seen along the left frontal convexity (series 6, images 11-13), where there is also a small amount of subarachnoid hemorrhage (series 7, image 14 and series 8, image 15), right temporal convexity (series 7, image 10) and along the falx (series 8, image 21). No significant mass effect. No midline shift. Hemorrhage is also noted extending along the clivus, through the foramen magnum, and along the anterior aspect of the craniocervical junction, measuring up to  4 mm (series 6, image 3 and series 4, image 16), narrowing the foramen magnum but without abnormal signal in the medulla or spinal cord in the imaged proximal cervical spine. Additional hemosiderin deposition in the left frontal lobe (series 8, image 18) and left temporal lobe (series 8, image 13). Trace hemosiderin deposition in the left occipital horn without evidence of hydrocephalus. Vascular: Patent arterial flow voids. Skull and upper cervical spine: Recent frontal craniotomy. Otherwise normal marrow signal. Sinuses/Orbits: No acute finding. Other: The mastoids are well aerated. IMPRESSION: 1. Small scattered acute and/or subacute infarcts in the bilateral cerebral hemispheres and left cerebellum. Given multiple vascular territories, an embolic etiology is suspected. 2. Postoperative findings of meningioma resection, with slightly increased edema in the right inferior frontal lobe. Evaluation for residual meningioma is limited in the  absence of intravenous contrast. 3. Subdural hemorrhage extending inferiorly along the clivus and narrowing the foramen magnum with possible mass effect but without abnormal signal in the medulla or proximal cervical spinal cord. 4. Small extra-axial collections, most likely late subacute subdural hemorrhage, without significant mass effect or midline shift. 5. Trace hemorrhage layering in the left occipital horn. No hydrocephalus. These results will be called to the ordering clinician or representative by the Radiologist Assistant, and communication documented in the PACS or [employer]. Electronically Signed   By: Merilyn Baba M.D.   On: 04/15/2022 12:02     Assessment: 76 year old female who is well-known to our service during this hospital stay, seen initially by Neurosurgery for meningioma, then by Neurology for seizures pre-operatively, orofacial dyskinesias postoperatively, and now for new strokes on MRI.  1. Exam reveals an obtunded patient with increased flexor tone in the upper extremities, R worse than L, increased extensor tone in BLE and minimal movement to noxious stimuli.  2. MRI brain: Small scattered acute and/or subacute infarcts in the bilateral cerebral hemispheres and left cerebellum. Given multiple vascular territories, an embolic etiology is suspected. 3. Also noted on MRI brain:  - Postoperative findings of meningioma resection, with slightly increased edema in the right inferior frontal lobe.  - Subdural hemorrhage extending inferiorly along the clivus and narrowing the foramen magnum with possible mass effect but without abnormal signal in the medulla or proximal cervical spinal cord. - Small extra-axial collections, most likely late subacute subdural hemorrhage, without significant mass effect or midline shift. - Trace hemorrhage layering in the left occipital horn. No hydrocephalus. 4. Multiple stroke risk factors as noted in HPI and her PMHx, the most significant  being her atrial fibrillation  Recommendations: # Acute strokes:   - Most likely secondary to her atrial fibrillation - Continue Eliquis. Benefits for stroke prevention are felt to outweigh risks of extension of the hemorrhages seen on her MRI, although the risk of rebleeding is also felt to be relatively high.  - Continue rosuvastatin - TTE - Stroke Team to make further recommendations including whether or not vascular imaging would change her plan of care  # Seizures: - Most recent EEG (7/11): Continuous slowing consistent with moderate diffuse encephalopathy, nonspecific to etiology. No seizures or epileptiform discharges were seen throughout the recording. - Continue Keppra at 1000 mg BID and Vimpat at 200 mg BID - Inpatient seizure precautions  # Tardive dyskinesia - Avoid neuroleptics - Continue to monitor  # Status-post meningioma resection - Management per Neurosurgery  Electronically signed: Dr. Kerney Elbe 04/16/2022, 7:23 PM

## 2022-04-16 NOTE — Progress Notes (Addendum)
Progress Note Patient: Tara [name] KMQ:[id number] DOB: 04-09-1946 DOA: 03/15/2022  DOS: the patient was seen and examined on 04/16/2022  Brief hospital course: 76 year old African-American female PMH of anxiety, depression, type II DM, HLD, PAF, on Eliquis, CKD 3B, breast cancer SP chemoradiation presented to hospital on 6/24 with new onset seizures. Known history of sphenoidal meningioma that was noted to be increased in size on the CT scan with surrounding edema so she is transferred from [facility] for further evaluation.  She underwent LTM with no further seizures and was evaluated by neurosurgery and underwent bifrontal craniotomy for meningioma resection on 03/24/2020 and was transferred to the intensive care unit postoperatively with PCCM consulting while she was in ICU. Significant Hospital Events: 6/24 presented to [facility] ED with new onset seizures, CTH with enlarging meningioma with edema, loaded with Keppra 7/3 Underwent bifrontal craniotomy for meningioma 7/6 CTH with new 7 mm thick subdural hematoma at the foramen magnum and tracking adjacent to the basion and odontoid not currently causing critical stenosis of the foramen magnum per radiology read, but merits surveillance. 7/11 recurrent sz like activity. Not correlated on EEG. CT unchanged.  7/13 neurology signed off. 7/21 palliative care was consulted. 7/23 CT chest and abdomen was performed negative for PE or significant pneumonia, without any hemorrhage 7/25 MRI brain without contrast shows increasing mass effect, SDH as well as subacute/acute small scattered infarct. 7/26 tube feeds resumed, neurology reconsulted.  Eliquis resumed per neurosurgery Assessment and Plan: Sphenoidal meningioma, enlarging with new vasogenic edema and new onset seizures Postoperative right inferior lobe edema, subdural hemorrhage along the clivus, SAH and left occipital horn without hydrocephalus Narrowing of the foramen magnum due SDH History is  consistent with tonic-clonic seizure with no previous history of seizures Initially presented to [facility].  Admitted under hospitalist service. Transferred to [facility]. Neurosurgery was consulted. Patient S/P bifrontal craniotomy for meningioma resection Patient was started on Keppra and Vimpat. Underwent multiple EEG as well as LTM. MRI shows evidence of SDH and SAH.  Which was present on prior CT as well postoperatively and neurosurgery feels it does not require any therapy or intervention. There is also report for increasing edema, management per neurosurgery. No indication for use of steroids.  Patient was treated with Decadron in the past. Patient is already started on anticoagulation.   Sepsis in setting of HCAP Vs aspiration PNA Acute hypoxic respiratory failure- On 10L of HFNC Febrile on 7/23 on 100.9 BC x2 NGTD, repeat BC X 2 NGTD  SARS COVID-negative UA unremarkable for infection, UC showing multiple species Chest x-ray done and showed "New hazy opacities projecting over both upper lobes in the suprahilar regions could reflect developing infection in the correct clinical setting CT chest, abdomen/pelvis, showed no PE, multifocal groundglass opacity likely pulmonary edema CT abdomen pelvis with no evidence of hemorrhage PCCM rec switching to IV Unasyn Continue IV Lasix Continue DuoNebs, nebulizers Continue supplemental oxygen as needed Blood cultures so far negative.  Due to persistent hypoxia repeat chest x-ray performed on 7/26 shows worsening bilateral opacities concerning for pulmonary edema.  Acute HFpEF Echocardiogram in June 2023 shows preserved EF, grade 1 diastolic dysfunction without any significant valvular abnormality. Currently appears to have severe exacerbation of heart failure. Currently being treated with IV Lasix. Family okay with the use of IV albumin in case needed for hypotension or to augment diuresis. Will initiate midodrine  support for hypotension  Acute embolic CVA-incidental Patient has history of A-fib. MRI brain shows  evidence of restricted diffusion and left cerebellum, bilateral occipital lobe, left parietal lobe, bilateral frontal lobe, concerning for small scattered acute and subacute infarct most likely embolic in the setting of A-fib. Anticoagulation is currently resumed. We will discuss with neurology regarding further work-up. Limited echocardiogram ordered. Patient currently remains n.p.o. per speech therapy evaluation. PT and OT following. Currently on Crestor.  LDL 106 in April 2023.  Currently due to being on tube feeding lipid panel will not be accurate. We will follow neurology recommendation.  ABLA Iron deficiency anemia Acute on chronic thrombocytopenia Hemoglobin dropped to 6.5 on 7/22 Transfused 2 units of PRBC on 7/22 Anemia panel iron 15, sats 8, ferritin 440, vitamin B12-->356 Continue monitor for signs and symptoms bleeding; no overt bleeding noted Daily CBC, continue iron support.   Hypokalemia Replace as needed   Anxiety and Depression Continue with sertraline 25 mg per tube daily    HTN-now hypotension Chronic systolic CHF/heart failure with preserved ejection fraction Paroxysmal Atrial Fibrillation Hyperlipidemia Echo 6/9 with EF 55% and Grade I diastolic dysfunction; previously had a normal diastolic function Continue Amiodarone 200 mg p.o. per tube daily, and Rosuvastatin 20 mg per tube daily Hold carvedilol 6.25 mg per tube twice daily. While the patient's anticoagulation has been resumed, patient will require close observation given her finding of SDH on CT and MRI. Blood pressure is rather soft therefore will initiate midodrine.   Type 2 DM controlled with hyperlipidemia and peripheral vascular disease Continue SSI, Accu-Cheks, hypoglycemic protocol Hemoglobin A1c 6.3 in 02/27/2022.   GERD/GI prophylaxis Continue pantoprazole 40 mg per tube daily    Dysphagia On Glucerna 1.5 at 1000 mL per tube at 40 MLS per hour every 24 hours as well as Prosource tube feeding liquid 45 MLS per tube twice daily  Obesity Body mass index is 39.31 kg/m.    GOC discussion Due to multiple comorbidities, minimally responsive state, overall poor prognosis Palliative care consulted for further goals of care discussion  Pressure ulcer left buttock stage II not present on admission. Continue wound care foam dressing and frequent turning.   Subjective: Minimally responsive, no purposeful movement, opens eyes occasionally, unable to follow any commands.  Nonverbal for me but per RN verbalized one-word this morning.  Physical Exam: Vitals:   04/16/22 0739 04/16/22 0841 04/16/22 1114 04/16/22 1454  BP: 115/77  105/78 98/64  Pulse: 88  93 90  Resp: 20  (!) 25 (!) 24  Temp:   98.3 F (36.8 C) 98 F (36.7 C)  TempSrc:   Oral Oral  SpO2: 91% 93% 90% 90%  Weight:      Height:       General: Appear in mild distress; no visible Abnormal Neck Mass Or lumps, Conjunctiva normal Cardiovascular: S1 and S2 Present, no Murmur, Respiratory: good respiratory effort, Bilateral Air entry present and bilateral  Crackles, no wheezes Abdomen: Bowel Sound present, Non tender  Extremities: bilateral  Pedal edema Neurology: lethargic and non verbal, right-sided gaze preference, continues lipsmacking when awake Gait not checked due to patient safety concerns   Data Reviewed: I have Reviewed nursing notes, Vitals, and Lab results since pt's last encounter. Pertinent lab results CBC and BMP I have ordered test including CBC BMP procalcitonin level I have independently visualized and interpreted imaging chest x-ray which showed increasing infiltrate on the left side. I have ordered imaging studies chest x-ray.  I have personally discussed patient's case with neurosurgery as well as neurology.  Family Communication: Discussed with daughter on the  phone  Disposition: Status is: Inpatient Remains inpatient appropriate because: Requiring aggressive IV diuresis and is currently on 14 L of oxygen to maintain saturation above 88.  The patient is critically ill with multiple organ systems failure and requires high complexity decision making for assessment and support, frequent evaluation and titration of therapies. Critical Care Time devoted to patient care services described in this note is 35 minutes   Author: Berle Mull, MD 04/16/2022 6:20 PM  Please look on [email or web address] to find out who is on call.

## 2022-04-16 NOTE — Progress Notes (Signed)
Patient CBG 66. Followed standing orders and administered 25 mL of dextrose.   Rechecked 15 mins later and CBG 159

## 2022-04-16 NOTE — Evaluation (Addendum)
Physical Therapy Evaluation Patient Details Name: Tara [name] MRN: [id number] DOB: Jul 01, 1946 Today's Date: 04/16/2022  History of Present Illness  76 yo woman admitted 03/15/22 with new onset seizures related to meningioma. s/p tumor resection 7/3. Recent DC from [facility] 6/22. PMH: CKD stage 3, HFpEF, a fib,  meningioma.  Clinical Impression  Patient seen for re-assessment today as PT signed off due to limited progress on 7/25.  Today pt able to sit EOB and benefited from OT/PT with two skilled hands to reduce muscle tone and improve midline positioning.  She could sit for several seconds with minguard A after positioning and tone inhibition.  She would localize with her eyes for seconds at a time after max cues and increased time.  She held a phone and a lotion bottle in her L hand when placed, but did not keep it near her ear or keep visual fixation on them.  Feel she may continue to benefit from skilled PT to progress with mobility and cognition.  Will focus on PT/OT co-treats for maximum benefit.  Remains appropriate for SNF at d/c.      Recommendations for follow up therapy are one component of a multi-disciplinary discharge planning process, led by the attending physician.  Recommendations may be updated based on patient status, additional functional criteria and insurance authorization.  Follow Up Recommendations Skilled nursing-short term rehab (<3 hours/day) Can patient physically be transported by private vehicle: No    Assistance Recommended at Discharge Frequent or constant Supervision/Assistance  Patient can return home with the following  Assistance with cooking/housework;Assist for transportation;Direct supervision/assist for financial management;Two people to help with walking and/or transfers;Two people to help with bathing/dressing/bathroom;Help with stairs or ramp for entrance    Equipment Recommendations Wheelchair (measurements PT);Wheelchair cushion (measurements  PT);Hospital bed;Other (comment)  Recommendations for Other Services       Functional Status Assessment Patient has had a recent decline in their functional status and demonstrates the ability to make significant improvements in function in a reasonable and predictable amount of time.     Precautions / Restrictions Precautions Precautions: Fall Precaution Comments: seizures, multiple lines Restrictions Weight Bearing Restrictions: No      Mobility  Bed Mobility Overal bed mobility: Needs Assistance Bed Mobility: Rolling, Sidelying to Sit, Sit to Supine Rolling: Total assist   Supine to sit: Total assist, +2 for physical assistance, +2 for safety/equipment Sit to supine: Total assist, +2 for physical assistance, +2 for safety/equipment   General bed mobility comments: totalA of 1 to complete rolling in bed for  placement of bed pad, pt with no initiation to assist or participate in movement    Transfers Overall transfer level: Needs assistance                Lateral/Scoot Transfers: Total assist, +2 physical assistance General transfer comment: scooting toward HOB in sitting with +2 total A    Ambulation/Gait                  Stairs            Wheelchair Mobility    Modified Rankin (Stroke Patients Only)       Balance Overall balance assessment: Needs assistance Sitting-balance support: Feet supported Sitting balance-Leahy Scale: Poor Sitting balance - Comments: Pt has moments for up to 5 seconds that pt can suppport self with no challenges. At other times, pt requires min to mod assist to sit on the EOB.  Seated EOB about 15 minutes to work on  trunk and cervical PROM and tone inhibition to get to midline and for balance. Postural control: Posterior lean     Standing balance comment: Pt currently unable to stand                             Pertinent Vitals/Pain Pain Assessment Pain Assessment: Faces Faces Pain Scale: No hurt     Home Living Family/patient expects to be discharged to:: Private residence Living Arrangements: Spouse/significant other Available Help at Discharge: Family;Available PRN/intermittently Type of Home: House Home Access: Ramped entrance Entrance Stairs-Rails: Right;Left Entrance Stairs-Number of Steps: 1 Alternate Level Stairs-Number of Steps: flight Home Layout: Two level;Laundry or work area in [place]: Rollator (4 wheels);Grab bars - Statistician (2 wheels);BSC/3in1;Wheelchair - manual;Hospital bed      Prior Function Prior Level of Function : Independent/Modified Independent;Needs assist             Mobility Comments: Husband providing assistance with all mobility, usually x1 HHA only with short gait bouts. Husband had been having pt ambulate every 2 hours after recent d/c. ADLs Comments: husband assisting with all ADL tasks with the exception of feeding; recent incontinence     Hand Dominance   Dominant Hand: Right    Extremity/Trunk Assessment   Upper Extremity Assessment Upper Extremity Assessment: Defer to OT evaluation RUE Deficits / Details: Full PROM, no attempt to participate in AAROM. No attempt to use righting reaction or spontaneous movements. Pt with increased tone throughout RUE with tight scapula RUE Sensation:  (unsure) RUE Coordination: decreased fine motor;decreased gross motor LUE Deficits / Details: decreased PROM, no attempt at active movement, tight in shoulder.  Increased tone noted. Pt did hold to phone in L hand but possibly just grasp reflex. LUE Sensation:  (unsure) LUE Coordination: decreased fine motor;decreased gross motor    Lower Extremity Assessment Lower Extremity Assessment: RLE deficits/detail;LLE deficits/detail RLE Deficits / Details: PROM tight heel cords, increased tone throughout, not moving to command or spontaneously LLE Deficits / Details: PROM tight heel cords, but better than R, mild increased  tone, not moving to command or spontaneously    Cervical / Trunk Assessment Cervical / Trunk Assessment: Other exceptions Cervical / Trunk Exceptions: R cervical rotation and R/upward gaze; trunk shortened on R side  Communication   Communication: No difficulties (previously no difficulties, now non-verbal)  Cognition Arousal/Alertness: Awake/alert Behavior During Therapy: Flat affect Overall Cognitive Status: Impaired/Different from baseline Area of Impairment: Orientation, Attention, Memory, Following commands, Safety/judgement, Problem solving, Awareness                 Orientation Level:  (no verbalizations this session) Current Attention Level: Focused   Following Commands:  (not following commands) Safety/Judgement: Decreased awareness of deficits, Decreased awareness of safety Awareness: Intellectual Problem Solving: Slow processing, Decreased initiation, Difficulty sequencing, Requires verbal cues, Requires tactile cues General Comments: PT did not follow commands. Pt's vision did shift to midline on 3 or 4 occasions just for a moment but unable to sustain.        General Comments General comments (skin integrity, edema, etc.): patient awake, but not following commands , sister-in-law present and supportive, pt able to localize visually for about 5 seconds with max stimulation and increased time.    Exercises     Assessment/Plan    PT Assessment Patient needs continued PT services  PT Problem List Decreased range of motion;Impaired tone;Decreased mobility;Decreased strength;Decreased cognition;Decreased balance  PT Treatment Interventions Therapeutic exercise;Wheelchair mobility training;Balance training;Manual techniques;Cognitive remediation;Functional mobility training;Therapeutic activities;Patient/family education;Neuromuscular re-education    PT Goals (Current goals can be found in the Care Plan section)  Acute Rehab PT Goals Patient Stated Goal:  sister in law hopeful for some functional return PT Goal Formulation: With family Time For Goal Achievement: 04/30/22 Potential to Achieve Goals: Fair Additional Goals Additional Goal #1: Patient to attend to task x 30 seconds for improved attention.    Frequency Min 2X/week     Co-evaluation PT/OT/SLP Co-Evaluation/Treatment: Yes Reason for Co-Treatment: Complexity of the patient's impairments (multi-system involvement);For patient/therapist safety;To address functional/ADL transfers;Necessary to address cognition/behavior during functional activity PT goals addressed during session: Mobility/safety with mobility;Balance OT goals addressed during session: ADL's and self-care       AM-PAC PT "6 Clicks" Mobility  Outcome Measure Help needed turning from your back to your side while in a flat bed without using bedrails?: Total Help needed moving from lying on your back to sitting on the side of a flat bed without using bedrails?: Total Help needed moving to and from a bed to a chair (including a wheelchair)?: Total Help needed standing up from a chair using your arms (e.g., wheelchair or bedside chair)?: Total Help needed to walk in hospital room?: Total Help needed climbing 3-5 steps with a railing? : Total 6 Click Score: 6    End of Session Equipment Utilized During Treatment: Gait belt Activity Tolerance: Patient tolerated treatment well Patient left: in bed;with family/visitor present;with bed alarm set   PT Visit Diagnosis: Other abnormalities of gait and mobility (R26.89);Other symptoms and signs involving the nervous system (R29.898)    Time: 1035-1110 PT Time Calculation (min) (ACUTE ONLY): 35 min   Charges:   PT Evaluation $PT Re-evaluation: 1 Re-eval          Magda Kiel, PT Acute Rehabilitation Services Office:[phone] 04/16/2022   [name] 04/16/2022, 2:37 PM

## 2022-04-16 NOTE — Progress Notes (Signed)
SLP Cancellation Note  Patient Details Name: SHARIAN DELIA MRN: [id number] DOB: Apr 23, 1946   Cancelled treatment:        Attempted to see pt for ongoing dysphagia management.  Pt working with PT/OT at time of attempt.  SLP will return as schedule permits.   Celedonio Savage, MA, [place] Office: [phone] 04/16/2022, 10:51 AM

## 2022-04-16 NOTE — Progress Notes (Signed)
Speech Language Pathology Treatment: Dysphagia  Patient Details Name: Tara [name] MRN: [id number] DOB: [birth year] Today's Date: 04/16/2022 Time: 1152-1202 SLP Time Calculation (min) (ACUTE ONLY): 10 min  Assessment / Plan / Recommendation Clinical Impression  Pt seen for ongoing dysphagia management.  Pt asleep on SLP arrival but roused to thermal stimulation to lips. Pt kept eyes closed for majority of today's session.  Pt with rhythmic lip pursing prior to bolus trials.  Lip smacking resumed during PO trials and continued throughout.  Pt is able to stop behavior to retrieve bolus from spoon with good lip rounding noted, but this allowed for little oral cavity visualization and it interfering with A-P transit of bolus trials.  There was immediate cough with 1 of 4 trials of ice chips which occurred prior to swallow.  Suspect pt is not actively transiting bolus trials to pharynx.  Passive transit seems to occur with delayed swallow initiation.  Swallow reflex palpated but with suspected reduced hyolaryngeal excursion. Given clinical s/s of aspiration with ice chips, larger boluses of thin liquid deferred this date.  Following PO trials of puree, suction was used to remove any residuals, with very little if any puree remaining in oral cavity after swallow.  SLP will continue to follow for PO readiness.  Recommend pt remain NPO with temporary alternate means of nutrition, hydration, and medication.      HPI HPI: Pt is a 76 y/o who presented 6/24 with new onset seizures. 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 EEG negative for seizures. Neurosurgey consulted and did not recommend meningioma resection as of 6/27. Decline in swallow function noted overnight 6/27. Dx acute toxic metabolic encephalopathy suspected secondary to keppra. Pt s/p Bifrontal craniotomy for resection of meningioma 7/3.  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 PMH: HTN, HLD, GERD, CKD  stage IIIb, chronic HFpEF, meningioma, breast CA-s/p right lumpectomy and chemoradiation in 2008, depression/anxiety. BSE 03/11/22: regular texture diet and thin liquids without need for follow up.      SLP Plan  Continue with current plan of care      Recommendations for follow up therapy are one component of a multi-disciplinary discharge planning process, led by the attending physician.  Recommendations may be updated based on patient status, additional functional criteria and insurance authorization.    Recommendations  Diet recommendations: NPO Medication Administration: Via alternative means                Oral Care Recommendations: Oral care QID Follow Up Recommendations: Skilled nursing-short term rehab (<3 hours/day) Assistance recommended at discharge: Frequent or constant Supervision/Assistance SLP Visit Diagnosis: Dysphagia, oropharyngeal phase (R13.12) Plan: Continue with current plan of care           Celedonio Savage, [place], [place] Office: [phone]  04/16/2022, 12:06 PM

## 2022-04-16 NOTE — Inpatient Diabetes Management (Signed)
Inpatient Diabetes Program Recommendations  AACE/ADA: New Consensus Statement on Inpatient Glycemic Control (2015)  Target Ranges:  Prepandial:   less than 140 mg/dL      Peak postprandial:   less than 180 mg/dL (1-2 hours)      Critically ill patients:  140 - 180 mg/dL   Lab Results  Component Value Date   GLUCAP 91 04/16/2022   HGBA1C 6.3 (H) 02/27/2022    Review of Glycemic Control  Latest Reference Range & Units 04/15/22 07:40 04/15/22 11:56 04/15/22 15:03 04/15/22 19:19 04/15/22 23:30 04/16/22 03:12 04/16/22 04:42 04/16/22 07:37  Glucose-Capillary 70 - 99 mg/dL 86 103 (H) 89 87 121 (H) 66 (L) 159 (H) 91  (H): Data is abnormally high (L): Data is abnormally low  Diabetes history: DM2 Outpatient Diabetes medications: Jardiance 10 mg qd Current orders for Inpatient glycemic control: Semglee 10 units qd, Novolog 0-15 units correction q 4 hrs.  Inpatient Diabetes Program Recommendations:   Tara [name] has been held 7/25 due to hypoglycemia. -D/C Semglee -Decrease Novolog correction to 0-6 units q 4 hrs.  Thank you, Nani Gasser. Dot Splinter, RN, MSN, CDE  Diabetes Coordinator Inpatient Glycemic Control Team Team Pager [phone] (8am-5pm) 04/16/2022 10:37 AM

## 2022-04-16 NOTE — Progress Notes (Signed)
Patient ID: Tara [name], female   DOB: [birth year], 76 y.o.   MRN: [id number] BP (!) 91/59 (BP Location: Left Wrist)   Pulse 82   Temp 98.1 F (36.7 C) (Oral)   Resp 20   Ht '4\' 9"'$  (1.448 m)   Wt 82.4 kg   SpO2 91%   BMI 39.31 kg/m  Currently sleeping. Has spoken to family in brief sentences Moves all extremitie Continuing supportive care. Started tube feeding, and eliquis

## 2022-04-16 NOTE — Progress Notes (Signed)
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 s/p tumor resection 7/3. Recent DC from [facility] 6/22. PMH: CKD stage 3, HFpEF, a fib,  meningioma.   OT comments  Pt seen for reevaluation with above diagnosis and deficits outlined below. Pt has had a steady decline over the last few weeks in ability to participate in active therapy.  Pt not following any commands, not tracking during OT session, not verbalizing or actively completing adls. Pt did track to midline x3 today but is unable to sustain midline vision for more than a second.  Pt transferred to EOB today with total assist x2 and sat on EOB with max to min guard assist (for short moments).  Pt tolerated sitting EOB for appx 12 minutes while doing hand over hand adl tasks and therapists focusing on alignment of trunk and head.  Will continue to do ongoing assessment of this pt as to pt's appropriateness for therapy given pt is not following commands.  Will reassess at each session. Will continue acutely for now with focus on trunk and head alignment.     Recommendations for follow up therapy are one component of a multi-disciplinary discharge planning process, led by the attending physician.  Recommendations may be updated based on patient status, additional functional criteria and insurance authorization.    Follow Up Recommendations  Skilled nursing-short term rehab (<3 hours/day)    Assistance Recommended at Discharge Frequent or constant Supervision/Assistance  Patient can return home with the following  Two people to help with walking and/or transfers;Two people to help with bathing/dressing/bathroom;Assistance with cooking/housework;Assistance with feeding;Direct supervision/assist for medications management;Direct supervision/assist for  financial management;Assist for transportation;Help with stairs or ramp for entrance   Equipment Recommendations  Other (comment) (tbd)    Recommendations for Other Services      Precautions / Restrictions Precautions Precautions: Fall Precaution Comments: seizures, multiple lines Restrictions Weight Bearing Restrictions: No       Mobility Bed Mobility Overal bed mobility: Needs Assistance Bed Mobility: Rolling, Sidelying to Sit, Sit to Supine Rolling: Total assist Sidelying to sit: Total assist Supine to sit: Total assist, +2 for physical assistance, +2 for safety/equipment Sit to supine: Total assist, +2 for physical assistance, +2 for safety/equipment   General bed mobility comments: totalA of 1 to complete rolling in bed for pericare and placement of lift pad, pt with no initiation to assist or participate in movement    Transfers                   General transfer comment: Pt unable to stand at this time.  No initiation to assist.  Dependent transfer to chair with maximove     Balance Overall balance assessment: Needs assistance Sitting-balance support: Single extremity supported Sitting balance-Leahy Scale: Poor Sitting balance - Comments: Pt has moments for up to 5 seconds that pt can suppport self with no challenges. At other times, pt requires min to mod assist to sit on the EOB. Postural control: Posterior lean   Standing balance-Leahy Scale: Zero Standing balance comment: Pt currently unable to stand                           ADL either performed or assessed with clinical judgement   ADL Overall ADL's : Needs assistance/impaired Eating/Feeding: NPO  Grooming: Total assistance   Upper Body Bathing: Total assistance   Lower Body Bathing: Total assistance   Upper Body Dressing : Total assistance   Lower Body Dressing: Total assistance   Toilet Transfer: Total assistance   Toileting- Clothing Manipulation and Hygiene: Total  assistance       Functional mobility during ADLs: Total assistance General ADL Comments: Total A for all aspects of care    Extremity/Trunk Assessment Upper Extremity Assessment Upper Extremity Assessment: RUE deficits/detail;LUE deficits/detail RUE Deficits / Details: Full PROM, no attempt to participate in AAROM. No attempt to use righting reaction or spontaneous movements. Pt with increased tone throughout RUE with tight scapula RUE Sensation:  (unsure) RUE Coordination: decreased fine motor;decreased gross motor LUE Deficits / Details: decreased PROM, no attempt at active movement, tight in shoulder.  Increased tone noted. Pt did hold to phone in L hand but possibly just grasp reflex. LUE Sensation:  (unsure) LUE Coordination: decreased fine motor;decreased gross motor   Lower Extremity Assessment Lower Extremity Assessment: Defer to PT evaluation        Vision   Vision Assessment?: Vision impaired- to be further tested in functional context Eye Alignment: Impaired (comment) Ocular Range of Motion: Impaired-to be further tested in functional context Alignment/Gaze Preference: Gaze right Tracking/Visual Pursuits: Impaired - to be further tested in functional context Additional Comments: Pt with R gaze and eyes looking to far right throughout session except 3 times when pt came close to midline   Perception Perception Perception: Not tested   Praxis Praxis Praxis: Not tested    Cognition Arousal/Alertness: Awake/alert Behavior During Therapy: Flat affect Overall Cognitive Status: Difficult to assess Area of Impairment: Orientation, Attention, Memory, Following commands, Safety/judgement, Problem solving, Awareness                 Orientation Level:  (pt is mute and not following commands) Current Attention Level: Focused   Following Commands:  (Pt not following commands) Safety/Judgement: Decreased awareness of safety, Decreased awareness of  deficits Awareness: Intellectual Problem Solving: Slow processing, Decreased initiation, Difficulty sequencing, Requires verbal cues, Requires tactile cues General Comments: PT did not follow commands. Pt's vision did shift to midline on 3 or 4 occasions just for a moment but unable to sustain.        Exercises      Shoulder Instructions       General Comments Pt continues to be challenged by mental status. Pt awake but not tracking or following commands at this time to actively participate in much therapy.    Pertinent Vitals/ Pain       Pain Assessment Pain Assessment: Faces Faces Pain Scale: No hurt  Home Living                                          Prior Functioning/Environment              Frequency  Min 2X/week        Progress Toward Goals  OT Goals(current goals can now be found in the care plan section)  Progress towards OT goals: Goals drowngraded-see care plan  Acute Rehab OT Goals Patient Stated Goal: unable OT Goal Formulation: Patient unable to participate in goal setting Time For Goal Achievement: 04/30/22 Potential to Achieve Goals: Fair ADL Goals Pt Will Perform Grooming: with max assist;sitting Pt Will Perform Upper Body Bathing: with max assist;sitting Pt  Will Perform Lower Body Bathing: with max assist;bed level Pt Will Transfer to Toilet: with max assist;with +2 assist;bedside commode Additional ADL Goal #1: Pt will track to midline to find family members with moderate directional cues and max assist to turn head  Plan Discharge plan remains appropriate    Co-evaluation    PT/OT/SLP Co-Evaluation/Treatment: Yes Reason for Co-Treatment: Complexity of the patient's impairments (multi-system involvement);Necessary to address cognition/behavior during functional activity PT goals addressed during session: Mobility/safety with mobility OT goals addressed during session: ADL's and self-care      AM-PAC OT "6 Clicks"  Daily Activity     Outcome Measure   Help from another person eating meals?: Total Help from another person taking care of personal grooming?: Total Help from another person toileting, which includes using toliet, bedpan, or urinal?: Total Help from another person bathing (including washing, rinsing, drying)?: Total Help from another person to put on and taking off regular upper body clothing?: Total Help from another person to put on and taking off regular lower body clothing?: Total 6 Click Score: 6    End of Session Equipment Utilized During Treatment: Oxygen;Gait belt  OT Visit Diagnosis: Unsteadiness on feet (R26.81);Other abnormalities of gait and mobility (R26.89);Muscle weakness (generalized) (M62.81);Low vision, both eyes (H54.2);Other symptoms and signs involving cognitive function;Pain   Activity Tolerance Patient tolerated treatment well   Patient Left in bed;with call bell/phone within reach;with bed alarm set   Nurse Communication Mobility status        Time: 2956-2130 OT Time Calculation (min): 33 min  Charges: OT General Charges $OT Visit: 1 Visit OT Evaluation $OT Re-eval: 1 Re-eval  Glenford Peers 04/16/2022, 11:38 AM

## 2022-04-16 NOTE — Progress Notes (Signed)
Daily Progress Note   Patient Name: Tara [name]       Date: 04/16/2022 DOB: [birth year]  Age: 76 y.o. MRN#: [id number] Attending Physician: Ashok Pall, MD Primary Care Physician: [name], MD Admit Date: 03/15/2022  Reason for Consultation/Follow-up: Establishing goals of care  Patient Profile/HPI:  Tara [name] is a 76 y.o. female with multiple medical problems including cardiomyopathy, history of A-fib on anticoagulation with Eliquis, CKD stage IIIb, history of breast cancer status post chemoradiation in 2008, who was admitted to the hospital 03/15/2022 with new onset seizures she had known meningioma, which was found to have increased in size on CT with surrounding edema.  Patient underwent bilateral formal craniotomy for meningioma resection on 03/24/2020.  She has had a prolonged hospitalization with minimal improvement neurologically.  Palliative care was consulted to address goals.    Subjective: Chart reviewed including labs, progress notes, imaging. Oxygen down to 12LPM from 14 yesterday. Today Doneta has her eyes open, gaze if fixed to the right. No tracking, she does not respond verbally to me or follow commands.  Called daughter, [name] for followup. [name] was pleased with improved communication from Dr. Christella Noa. Her understanding is that Dr. Christella Noa has told her that he believes patient will make a full recovery, that she simply needs more time.  He is not concerned about her MRI from yesterday.  However, she states that she isn't unrealistic and understands that there is a risk that her mom may not. Her primary goal is to restore her mom to a state where she can take her home.  Some concerns were expressed about Rhyli's nursing care- her mom needs to be repositioned  more frequently. She has started to develop a pressure sore on her sacrum.  [name] reports that today her mom said short words like hi and bye. She also felt she was following some commands- she felt her mom relax her arm when she told her to relax it.  The family wishes for her mom to remain full code for now- however, if her status began to decline and escalation of care was needed she would want a conversation with the medical team.  [name] also shares that her mom would not want to die in a hospital.   Review of Systems  Unable to perform ROS: Mental status  change     Physical Exam Vitals and nursing note reviewed.  Constitutional:      Appearance: She is ill-appearing.  Cardiovascular:     Rate and Rhythm: Normal rate.  Pulmonary:     Comments: Increased oxygen demands Neurological:     Comments: unresponsive             Vital Signs: BP 105/78 (BP Location: Left Wrist)   Pulse 93   Temp 98.3 F (36.8 C) (Oral)   Resp (!) 25   Ht '4\' 9"'$  (1.448 m)   Wt 82.4 kg   SpO2 90%   BMI 39.31 kg/m  SpO2: SpO2: 90 % O2 Device: O2 Device: Nasal Cannula O2 Flow Rate: O2 Flow Rate (L/min): 12 L/min  Intake/output summary:  Intake/Output Summary (Last 24 hours) at 04/16/2022 1307 Last data filed at 04/16/2022 0900 Gross per 24 hour  Intake 300.67 ml  Output 500 ml  Net -199.33 ml   LBM: Last BM Date : 04/15/22 Baseline Weight: Weight: 85 kg Most recent weight: Weight: 82.4 kg       Palliative Assessment/Data: PPS: 10%      Patient Active Problem List   Diagnosis Date Noted   Palliative care encounter    Acute respiratory failure with hypoxia (HCC)    Aspiration into airway    Pressure injury of skin 04/07/2022   Thrombocytopenia (HCC)    Meningioma (HCC) 03/24/2022   Seizure ([place]) 03/15/2022   Atrial fibrillation, chronic (HCC) 03/15/2022   Protein-calorie malnutrition, moderate (HCC) 03/15/2022   Atrial fibrillation with RVR (HCC)    Metabolic acidosis 52/77/8242    Acute renal failure superimposed on stage 3b chronic kidney disease ([place]) 02/27/2022   Anxiety and depression 02/27/2022   Meningioma, cerebral ([place]) 02/27/2022   Normocytic anemia 02/27/2022   Acute renal failure (ARF) ([place]) 02/27/2022   Hemorrhoids 01/04/2021   Ankle deformity, right 09/12/2020   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   Port-A-Cath in place 09/28/2018   Colon adenomas 08/06/2018   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 ([place]) 06/09/2018   Hypertension 01/25/2018   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   Demand ischemia (HCC)    Prolonged Q-T interval on ECG 09/29/2016   Chronic kidney disease (CKD), stage III (moderate) (HCC) 09/29/2016   Leukocytosis 09/18/2016   Abnormal CT scan, chest    Upper airway cough syndrome  vs Cough variant asthma  04/09/2016   Seasonal allergies 08/08/2014   Family history of colon cancer 03/30/2014   Osteoporosis 35/36/1443   Metabolic syndrome X 15/40/0867   SVT (supraventricular tachycardia) (HCC) 61/95/0932   Chronic systolic CHF (congestive heart failure) ([place]) 03/14/2013   Vitamin D deficiency 07/23/2010   Morbid obesity ([place]) 07/04/2009   Malignant neoplasm of right breast ([place]) 12/13/2007   Hyperlipidemia 12/13/2007   Anxiety state 12/13/2007   Depression, major, single episode, in partial remission ([place]) 12/13/2007   GERD 12/13/2007    Palliative Care Assessment & Plan    Assessment/Recommendations/Plan  Per chart review- cause for her mental status changes remain unclear-  Family hears from Dr. Christella Noa that he believes she can still make a full recovery - their GOC is to restore her to a state that she can return home- she would not wish to die in a hospital if medical team felt she was approaching end of life PMT will continue to follow and support family  Code Status: Full code  Prognosis:  Unable to determine  Discharge Planning: To Be Determined  Thank you for allowing the  Palliative Medicine Team to assist in the care of this patient.   Greater than 50%  of this time was spent counseling and coordinating care related to the above assessment and plan.  Mariana Kaufman, AGNP-C Palliative Medicine   Please contact Palliative Medicine Team phone at [phone] for questions and concerns.

## 2022-04-16 NOTE — Hospital Course (Addendum)
76 year old African-American female PMH of anxiety, depression, type II DM, HLD, PAF, on Eliquis, CKD 3B, breast cancer SP chemoradiation presented to hospital on 6/24 with new onset seizures. Known history of sphenoidal meningioma that was noted to be increased in size on the CT scan with surrounding edema so she is transferred from [facility] for further evaluation.  She underwent LTM with no further seizures and was evaluated by neurosurgery and underwent bifrontal craniotomy for meningioma resection on 03/24/2020 and was transferred to the intensive care unit postoperatively with PCCM consulting while she was in ICU. 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  Treated with Unasyn for 5 days. 7/11 recurrent sz like activity. Not correlated on EEG. CT unchanged.  7/13 neurology signed off.  07/17 transferred to [facility].   7/19 started on ABX for sepsis then transition to Unasyn. 7/21 palliative care was consulted. 7/23 CT chest and abdomen was performed negative for PE or significant pneumonia, without any hemorrhage 7/25 MRI brain without contrast shows increasing mass effect, SDH as well as subacute/acute small scattered infarct. 7/26 tube feeds resumed, neurology reconsulted.  Eliquis resumed per neurosurgery 7/27 oxygenation improving, mentation improving, neurology following.  CTA head and neck without any LVO.  Lower extremity Doppler without any DVT.  Echo shows new systolic dysfunction with global hypokinesis EF 40 to 45% and significant RV failure. 7/30 had another aspiration event. Tube feeds on hold. Now on NRB. Remains full code. No tracheostomy per husband. Per Neurosurgery, give at least two weeks if not more to see if she improves.   8/1 oxygenation improving again to 5 L.  Trickle tube feeds started. 8/9 on D1 honey thick liquid diet.  Calorie count initiated. 8/13 oxygenation improving to 2 L. 8/14 core track removed nocturnal tube feeds stopped. 8/15 leukocytosis worsening, abdomen distended, x-ray abdomen shows evidence of possible sigmoid volvulus, GI consulted, CT abdomen negative for any volvulus and only shows redundant sigmoid colon.  Will resume diet. 8/24 keppra dose decreased 8/26 ritalin started 8/16-present: leukocytosis of unknown etiology persists 08/29 to 08/30 with vomiting.  09/03 continue to make slow progress with moderate cognitive deficits.  09/06 patient has been medically stable. Pending disposition. Her daughter will like to take her home when stable from neurosurgical point   09/11 patient has been medically stable, midodrine and loop diuretic therapy have been discontinued with good toleration.

## 2022-04-17 ENCOUNTER — Inpatient Hospital Stay (HOSPITAL_COMMUNITY): Payer: Medicare Other

## 2022-04-17 DIAGNOSIS — J9601 Acute respiratory failure with hypoxia: Secondary | ICD-10-CM | POA: Diagnosis not present

## 2022-04-17 DIAGNOSIS — R569 Unspecified convulsions: Secondary | ICD-10-CM | POA: Diagnosis not present

## 2022-04-17 DIAGNOSIS — I482 Chronic atrial fibrillation, unspecified: Secondary | ICD-10-CM | POA: Diagnosis not present

## 2022-04-17 DIAGNOSIS — R400 Somnolence: Secondary | ICD-10-CM

## 2022-04-17 DIAGNOSIS — I6340 Cerebral infarction due to embolism of unspecified cerebral artery: Secondary | ICD-10-CM

## 2022-04-17 DIAGNOSIS — T17908D Unspecified foreign body in respiratory tract, part unspecified causing other injury, subsequent encounter: Secondary | ICD-10-CM | POA: Diagnosis not present

## 2022-04-17 DIAGNOSIS — Z515 Encounter for palliative care: Secondary | ICD-10-CM | POA: Diagnosis not present

## 2022-04-17 DIAGNOSIS — I639 Cerebral infarction, unspecified: Secondary | ICD-10-CM

## 2022-04-17 DIAGNOSIS — D320 Benign neoplasm of cerebral meninges: Secondary | ICD-10-CM | POA: Diagnosis not present

## 2022-04-17 DIAGNOSIS — Z7189 Other specified counseling: Secondary | ICD-10-CM | POA: Diagnosis not present

## 2022-04-17 DIAGNOSIS — I6389 Other cerebral infarction: Secondary | ICD-10-CM | POA: Diagnosis not present

## 2022-04-17 LAB — CBC WITH DIFFERENTIAL/PLATELET
Abs Immature Granulocytes: 0.45 K/uL — ABNORMAL HIGH (ref 0.00–0.07)
Basophils Absolute: 0 K/uL (ref 0.0–0.1)
Basophils Relative: 1 %
Eosinophils Absolute: 0 K/uL (ref 0.0–0.5)
Eosinophils Relative: 1 %
HCT: 31.8 % — ABNORMAL LOW (ref 36.0–46.0)
Hemoglobin: 10.1 g/dL — ABNORMAL LOW (ref 12.0–15.0)
Immature Granulocytes: 7 %
Lymphocytes Relative: 8 %
Lymphs Abs: 0.6 K/uL — ABNORMAL LOW (ref 0.7–4.0)
MCH: 27.4 pg (ref 26.0–34.0)
MCHC: 31.8 g/dL (ref 30.0–36.0)
MCV: 86.2 fL (ref 80.0–100.0)
Monocytes Absolute: 0.4 K/uL (ref 0.1–1.0)
Monocytes Relative: 5 %
Neutro Abs: 5.5 K/uL (ref 1.7–7.7)
Neutrophils Relative %: 78 %
Platelets: 175 K/uL (ref 150–400)
RBC: 3.69 MIL/uL — ABNORMAL LOW (ref 3.87–5.11)
RDW: 18.6 % — ABNORMAL HIGH (ref 11.5–15.5)
Smear Review: NORMAL
WBC: 7 K/uL (ref 4.0–10.5)
nRBC: 0 % (ref 0.0–0.2)

## 2022-04-17 LAB — BASIC METABOLIC PANEL
Anion gap: 9 (ref 5–15)
BUN: 31 mg/dL — ABNORMAL HIGH (ref 8–23)
CO2: 27 mmol/L (ref 22–32)
Calcium: 8.4 mg/dL — ABNORMAL LOW (ref 8.9–10.3)
Chloride: 111 mmol/L (ref 98–111)
Creatinine, Ser: 0.95 mg/dL (ref 0.44–1.00)
GFR, Estimated: >60 mL/min (ref >60)
Glucose, Bld: 134 mg/dL — ABNORMAL HIGH (ref 70–99)
Potassium: 3.2 mmol/L — ABNORMAL LOW (ref 3.5–5.1)
Sodium: 147 mmol/L — ABNORMAL HIGH (ref 135–145)

## 2022-04-17 LAB — ECHOCARDIOGRAM LIMITED
Calc EF: 53.3 %
Height: 57 in
Single Plane A2C EF: 56.9 %
Single Plane A4C EF: 51.1 %
Weight: 2906.54 oz

## 2022-04-17 LAB — GLUCOSE, CAPILLARY
Glucose-Capillary: 125 mg/dL — ABNORMAL HIGH (ref 70–99)
Glucose-Capillary: 128 mg/dL — ABNORMAL HIGH (ref 70–99)
Glucose-Capillary: 132 mg/dL — ABNORMAL HIGH (ref 70–99)
Glucose-Capillary: 136 mg/dL — ABNORMAL HIGH (ref 70–99)
Glucose-Capillary: 141 mg/dL — ABNORMAL HIGH (ref 70–99)
Glucose-Capillary: 160 mg/dL — ABNORMAL HIGH (ref 70–99)
Glucose-Capillary: 162 mg/dL — ABNORMAL HIGH (ref 70–99)

## 2022-04-17 MED ORDER — APIXABAN 5 MG PO TABS
5.0000 mg | ORAL_TABLET | Freq: Two times a day (BID) | ORAL | Status: DC
  Administered 2022-04-17 – 2022-04-18 (×2): 5 mg via ORAL
  Filled 2022-04-17 (×4): qty 1

## 2022-04-17 MED ORDER — ROSUVASTATIN CALCIUM 20 MG PO TABS
20.0000 mg | ORAL_TABLET | Freq: Every day | ORAL | Status: DC
  Administered 2022-04-18 – 2022-05-05 (×18): 20 mg
  Filled 2022-04-17 (×18): qty 1

## 2022-04-17 MED ORDER — INSULIN ASPART 100 UNIT/ML IJ SOLN
0.0000 [IU] | Freq: Four times a day (QID) | INTRAMUSCULAR | Status: DC
  Administered 2022-04-17: 3 [IU] via SUBCUTANEOUS
  Administered 2022-04-17 – 2022-04-18 (×2): 2 [IU] via SUBCUTANEOUS
  Administered 2022-04-18: 3 [IU] via SUBCUTANEOUS
  Administered 2022-04-18 – 2022-04-23 (×11): 2 [IU] via SUBCUTANEOUS
  Administered 2022-04-23: 3 [IU] via SUBCUTANEOUS
  Administered 2022-04-23: 2 [IU] via SUBCUTANEOUS
  Administered 2022-04-23 – 2022-04-24 (×3): 3 [IU] via SUBCUTANEOUS
  Administered 2022-04-24 (×2): 2 [IU] via SUBCUTANEOUS
  Administered 2022-04-25: 3 [IU] via SUBCUTANEOUS
  Administered 2022-04-25: 2 [IU] via SUBCUTANEOUS
  Administered 2022-04-25 (×2): 3 [IU] via SUBCUTANEOUS
  Administered 2022-04-26 – 2022-04-28 (×9): 2 [IU] via SUBCUTANEOUS
  Administered 2022-04-28 – 2022-04-29 (×3): 3 [IU] via SUBCUTANEOUS
  Administered 2022-04-29 (×2): 2 [IU] via SUBCUTANEOUS
  Administered 2022-04-30: 3 [IU] via SUBCUTANEOUS
  Administered 2022-04-30 (×2): 2 [IU] via SUBCUTANEOUS
  Administered 2022-05-01: 3 [IU] via SUBCUTANEOUS
  Administered 2022-05-01: 2 [IU] via SUBCUTANEOUS
  Administered 2022-05-01 – 2022-05-02 (×3): 3 [IU] via SUBCUTANEOUS
  Administered 2022-05-02: 2 [IU] via SUBCUTANEOUS
  Administered 2022-05-02 (×2): 3 [IU] via SUBCUTANEOUS
  Administered 2022-05-03: 2 [IU] via SUBCUTANEOUS
  Administered 2022-05-03 (×2): 3 [IU] via SUBCUTANEOUS
  Administered 2022-05-03 – 2022-05-04 (×2): 2 [IU] via SUBCUTANEOUS
  Administered 2022-05-04 – 2022-05-05 (×2): 3 [IU] via SUBCUTANEOUS
  Administered 2022-05-05 – 2022-05-06 (×4): 2 [IU] via SUBCUTANEOUS

## 2022-04-17 MED ORDER — ROSUVASTATIN CALCIUM 20 MG PO TABS: 40.0000 mg | ORAL_TABLET | Freq: Every day | ORAL | Status: DC

## 2022-04-17 MED ORDER — SACCHAROMYCES BOULARDII 250 MG PO CAPS
500.0000 mg | ORAL_CAPSULE | Freq: Two times a day (BID) | ORAL | Status: AC
Start: 2022-04-17 — End: 2022-04-17
  Administered 2022-04-17 (×2): 500 mg
  Filled 2022-04-17 (×2): qty 2

## 2022-04-17 MED ORDER — IOHEXOL 350 MG/ML SOLN
100.0000 mL | Freq: Once | INTRAVENOUS | Status: AC | PRN
  Administered 2022-04-17: 75 mL via INTRAVENOUS

## 2022-04-17 MED ORDER — BACID PO TABS
2.0000 | ORAL_TABLET | Freq: Two times a day (BID) | ORAL | Status: DC
Start: 2022-04-17 — End: 2022-04-17

## 2022-04-17 MED ORDER — PERFLUTREN LIPID MICROSPHERE
1.0000 mL | INTRAVENOUS | Status: AC | PRN
  Administered 2022-04-17: 2 mL via INTRAVENOUS

## 2022-04-17 MED ORDER — POTASSIUM CHLORIDE CRYS ER 20 MEQ PO TBCR: 40.0000 meq | EXTENDED_RELEASE_TABLET | Freq: Four times a day (QID) | ORAL | Status: DC

## 2022-04-17 MED ORDER — POTASSIUM CHLORIDE 20 MEQ PO PACK
40.0000 meq | PACK | Freq: Four times a day (QID) | ORAL | Status: AC
  Administered 2022-04-17 (×2): 40 meq
  Filled 2022-04-17 (×2): qty 2

## 2022-04-17 MED ORDER — MIDODRINE HCL 5 MG PO TABS
2.5000 mg | ORAL_TABLET | Freq: Three times a day (TID) | ORAL | Status: DC
  Administered 2022-04-17 – 2022-04-19 (×6): 2.5 mg
  Filled 2022-04-17 (×8): qty 1

## 2022-04-17 NOTE — Progress Notes (Signed)
Progress Note Patient: Tara [name] MOQ:[id number] DOB: 05/18/1946 DOA: 03/15/2022  DOS: the patient was seen and examined on 04/17/2022  Brief hospital course: 76 year old African-American female PMH of anxiety, depression, type II DM, HLD, PAF, on Eliquis, CKD 3B, breast cancer SP chemoradiation presented to hospital on 6/24 with new onset seizures. Known history of sphenoidal meningioma that was noted to be increased in size on the CT scan with surrounding edema so she is transferred from [facility] for further evaluation.  She underwent LTM with no further seizures and was evaluated by neurosurgery and underwent bifrontal craniotomy for meningioma resection on 03/24/2020 and was transferred to the intensive care unit postoperatively with PCCM consulting while she was in ICU. Significant Hospital Events: 6/24 presented to [facility] ED with new onset seizures, CTH with enlarging meningioma with edema, loaded with Keppra 7/3 Underwent bifrontal craniotomy for meningioma 7/6 CTH with new 7 mm thick subdural hematoma at the foramen magnum and tracking adjacent to the basion and odontoid not currently causing critical stenosis of the foramen magnum per radiology read, but merits surveillance. 7/11 recurrent sz like activity. Not correlated on EEG. CT unchanged.  7/13 neurology signed off. 7/21 palliative care was consulted. 7/23 CT chest and abdomen was performed negative for PE or significant pneumonia, without any hemorrhage 7/25 MRI brain without contrast shows increasing mass effect, SDH as well as subacute/acute small scattered infarct. 7/26 tube feeds resumed, neurology reconsulted.  Eliquis resumed per neurosurgery 7/27 oxygenation improving, mentation improving, neurology following.  CTA head and neck without any LVO.  Lower extremity Doppler without any DVT.  Echo shows new systolic dysfunction with global hypokinesis EF 40 to 45% and significant RV failure.  Assessment and Plan: Sphenoidal  meningioma, enlarging with new vasogenic edema and new onset seizures Postoperative right inferior lobe edema, subdural hemorrhage along the clivus, SAH and left occipital horn without hydrocephalus Narrowing of the foramen magnum due SDH History is consistent with tonic-clonic seizure with no previous history of seizures Initially presented to [facility]  Admitted under hospitalist service. Transferred to [facility]. Neurosurgery was consulted. Patient S/P bifrontal craniotomy for meningioma resection Patient was started on Keppra and Vimpat. Underwent multiple EEG as well as LTM. MRI shows evidence of SDH and SAH.  Which was present on prior CT as well postoperatively and neurosurgery feels it does not require any therapy or intervention. There is also report for increasing edema, management per neurosurgery. No indication for use of steroids.  Patient was treated with Decadron in the past. Patient is already started on anticoagulation.   Sepsis in setting of HCAP Vs aspiration PNA Acute hypoxic respiratory failure- On 10L of HFNC Febrile on 7/23 on 100.9 BC x2 NGTD, repeat BC X 2 NGTD  SARS COVID-negative UA unremarkable for infection, UC showing multiple species Chest x-ray done and showed "New hazy opacities projecting over both upper lobes in the suprahilar regions could reflect developing infection in the correct clinical setting CT chest, abdomen/pelvis, showed no PE, multifocal groundglass opacity likely pulmonary edema CT abdomen pelvis with no evidence of hemorrhage PCCM rec switching to IV Unasyn Continue IV Lasix Continue DuoNebs, nebulizers Continue supplemental oxygen as needed Blood cultures so far negative.  Due to persistent hypoxia repeat chest x-ray performed on 7/26 shows worsening bilateral opacities concerning for pulmonary edema.   Acute combined systolic and diastolic CHF with right ventricular dysfunction Echocardiogram in June 2023 shows  preserved EF, grade 1 diastolic dysfunction without any significant valvular abnormality. Currently  appears to have severe exacerbation of heart failure. Repeat echocardiogram on 04/17/2022 shows EF dropping to 40 to 45% with global hypokinesis.  RV function also moderately low. CT PE protocol as well as lower extremity Doppler have ruled out VTE/PE as etiology of RV dysfunction. Currently being treated with IV Lasix. Family okay with the use of IV albumin in case needed for hypotension or to augment diuresis. Will initiate midodrine support for hypotension Pulmonary following.  Appreciate assistance.   Acute embolic CVA-incidental Patient has history of A-fib. MRI brain shows evidence of restricted diffusion and left cerebellum, bilateral occipital lobe, left parietal lobe, bilateral frontal lobe, concerning for small scattered acute and subacute infarct most likely embolic in the setting of A-fib. Anticoagulation is currently resumed. We will discuss with neurology regarding further work-up. Limited echocardiogram ordered. Patient currently remains n.p.o. per speech therapy evaluation. PT and OT following.  Recommend SNF. Currently on Crestor.  LDL 106 in April 2023.  Currently due to being on continuous tube feeding lipid panel will not be accurate. We will follow neurology recommendation.   Postoperative acute blood loss anemia-expected. Iron deficiency anemia Acute on chronic thrombocytopenia Hemoglobin dropped to 6.5 on 7/22 Transfused 2 units of PRBC on 7/22 Anemia panel iron 15, sats 8, ferritin 440, vitamin B12-->356 Continue monitor for signs and symptoms bleeding; no overt bleeding noted Platelet counts now improving from nadir of 113. Daily CBC, continue iron support.   Hypokalemia Replace as needed   Anxiety and Depression Continue with sertraline 25 mg per tube daily    HTN-now hypotension Chronic systolic CHF/heart failure with preserved ejection  fraction Paroxysmal Atrial Fibrillation Hyperlipidemia Echo 6/9 with EF 55% and Grade I diastolic dysfunction; previously had a normal diastolic function Continue Amiodarone 200 mg p.o. per tube daily, and Rosuvastatin 20 mg per tube daily Hold carvedilol 6.25 mg per tube twice daily. While the patient's anticoagulation has been resumed, patient will require close observation given her finding of SDH on CT and MRI. Blood pressure is rather soft therefore will continue midodrine.   Type 2 DM controlled with hyperlipidemia and peripheral vascular disease Continue SSI, Accu-Cheks, hypoglycemic protocol Hemoglobin A1c 6.3 in 02/27/2022.   GERD/GI prophylaxis Continue pantoprazole 40 mg per tube daily   Dysphagia On Glucerna 1.5 at 1000 mL per tube at 40 MLS per hour every 24 hours as well as Prosource tube feeding liquid 45 MLS per tube twice daily   Obesity Body mass index is 39.31 kg/m.  Placing the patient at high risk of poor outcome.  She may also have undiagnosed sleep apnea.   GOC discussion Due to multiple comorbidities, minimally responsive state, overall poor prognosis Palliative care consulted for further goals of care discussion   Pressure ulcer left buttock stage II not present on admission. Continue wound care foam dressing and frequent turning.  Subjective: Subjectively more interactive, unable to follow any commands yet.  No acute events overnight.  Physical Exam: Vitals:   04/17/22 1454 04/17/22 1508 04/17/22 1630 04/17/22 1633  BP:  94/60    Pulse: 95 94 94 93  Resp: (!) 24 19 (!) 30 (!) 36  Temp:  98.7 F (37.1 C)    TempSrc:  Oral    SpO2: 93% 92% (!) 87% 92%  Weight:      Height:       General: Appear in mild distress; no visible Abnormal Neck Mass Or lumps, Conjunctiva normal Cardiovascular: S1 and S2 Present, aortic systolic  Murmur, Respiratory: good respiratory effort,  Bilateral Air entry present and faint crackles, no wheezes Abdomen: Bowel Sound  present, Non tender Extremities: no Pedal edema Neurology: alert and non verbal unable to follow any commands. Gait not checked due to patient safety concerns   Data Reviewed: I have Reviewed nursing notes, Vitals, and Lab results since pt's last encounter. Pertinent lab results CBC and BMP I have ordered test including CBC and BMP and magnesium I have discussed pt's care plan and test results with pulmonary.   Family Communication: Husband, 2 sisters at bedside  Disposition: Status is: Inpatient Remains inpatient appropriate because: Need for IV diuresis and close observation.  Currently on 10 L of oxygen satting only 92%.  Author: Berle Mull, MD 04/17/2022 6:18 PM  Please look on [email or web address] to find out who is on call.

## 2022-04-17 NOTE — Progress Notes (Signed)
  Echocardiogram 2D Echocardiogram has been performed.  Tara [name] F 04/17/2022, 9:25 AM

## 2022-04-17 NOTE — Progress Notes (Signed)
NAME:  Tara [name], MRN:  [id number], DOB:  1945/12/29, LOS: 31 ADMISSION DATE:  03/15/2022, CONSULTATION DATE:  04/17/22 REFERRING MD:  Christella Noa, CHIEF COMPLAINT:  Meningioma, Seizure   History of Present Illness:  Tara [name] is a 76 y.o. F with PMH significant for Anxiety, Depression, T2DM, HTN, HL, cardiomyopathy, Atrial Fibrillation on Eliquis, CKD IIIb, breast Ca s/p chemoradiation in 2008 who presented to the ED 6/24 with new onset seizures.   She had a known planum sphenoidal meningioma that was increased in size on CT with surrounding edema, so was transferred to [facility] for further evaluation.  LTM showed no further seizures.  She was evaluated by neurosurgery and underwent bifrontal craniotomy for meningioma resection on 7/3 and was transferred to intensive care post-op with PCCM consult while in ICU.   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   Significant Hospital Events: Including procedures, antibiotic start and stop dates in addition to other pertinent events   6/24 presented to [facility] ED with new onset seizures, CTH with enlarging meningioma with edema, loaded with Keppra 7/3 Underwent bifrontal craniotomy for meningioma, monitor in ICU 7/5 stable, no  further seizure activity 7/6 less responsive, two episodes of vomiting overnight and increased oral secretions today.  Continuous blinking on exam. CTH yesterday with new 7 mm thick subdural hematoma at the foramen magnum and tracking adjacent to the basion and odontoid not currently causing critical stenosis of the foramen magnum per radiology read, but merits surveillance. 7/7 Better night and more awake and responsive, repeat head CT unchanged and no seizures on video EEG 7/9 Recurrent seizure overnight requiring '4mg'$  of IV ativan to break. This am less responsive with forced right gaze  7/10 no further seizure activity, awake, responding, no forced gaze 7/11 recurrent sz like activity. Not correlated on EEG. CT unchanged.  7/21 reconsulted for hypoxia and bilateral infiltrates 7/24 Continued increasing oxygen needs. CT Angio Chest >> No PE's, Multifocal ground glass opacity within both lungs, likely pulmonary edema, small right pleural effusion , Calcified granuloma in the right lung with calcified right hilar adenopathy, likely a sequela of remote granulomatous infection. Interim History / Subjective:  Oxygen demands slightly improved today - on 10L HFNC   Objective   Blood pressure 102/63, pulse 94, temperature 98 F (36.7 C), resp. rate (!) 26, height '4\' 9"'$  (1.448 m), weight 82.4 kg, SpO2 92 %.        Intake/Output Summary (Last 24 hours) at 04/17/2022 1338 Last data filed at 04/17/2022 1307 Gross per 24 hour  Intake 1291.33 ml  Output 1700 ml  Net -408.67 ml   Filed Weights   04/12/22 1901 04/13/22 0500 04/14/22 0500  Weight: 76.4 kg 79 kg 82.4 kg    General: Elderly woman, no distress on high flow oxygen HEENT: frontal crani site is c/d/ well approximated. Some L periorbital ecchymosis. +cortrak  Neuro: Grimace with stimulation, does not open eyes.  Weak cough CV: rrr  cap refill brisk  PULM: Clear bilaterally GI: soft, non tender  Extremities: warm and dry, foot boots, no  acute deformity  Skin: pale, c/d/w    Resolved Hospital Problem list   AKI  Assessment & Plan:   Acute respiratory failure with hypoxia Suspected aspiration PNA, BUL infiltrates  Dysphagia  7/24>> Increasing oxygen demands now on 14L HFNC -Doubt amiodarone toxicity -CTA negative for PE and + for bilateral GG infiltrates and pulmonary edema.  P -Push pulmonary hygiene.  Patient is n.p.o. speech therapy following but is not a candidate to take oral diet at this time -Tube feeding -Wean oxygen as able -She may require intermittent NTS  Sphenoidal meningioma with associated vasogenic edema and seizure, now s/p bifrontal craniectomy New CVAs MRI 04/15/2022 Eliquis stopped due to drop in HGB and facial bruise -Op date 7/21  -post op had seizure like activity but none proven on eeg  P -Seizure precautions.  Continue Keppra, Vimpat -Appreciate neurology management -Postop care per neurosurgery  [place] -palliative care following -If family wants to continue to be aggressive and if recurrent aspiration then she may require tracheostomy    Best Practice (right click and "Reselect all SmartList Selections" daily)   Diet/type: tubefeeds  DVT prophylaxis: PAS GI prophylaxis: PPI Lines: N/A Foley:  N/A Code Status:  full code Last date of multidisciplinary goals of care discussion: 7/24  Critical care: NA   Baltazar Apo, MD, PhD 04/17/2022, 1:39 PM [facility] [phone] or if no answer before 7:00PM call [phone] For any issues after 7:00PM please call eLink [phone]

## 2022-04-17 NOTE — Progress Notes (Addendum)
Pharmacy Antibiotic Note  Tara [name] is a 76 y.o. female with PNA on unasyn for concern of aspiration PNA. Pharmacy consulted to dose unasyn.  WBC wnl, afeb  Discussed with MD, continue Unasyn for total of 10 days.   Plan: -Continue Unasyn 3gm IV q8h x 10 days total (stop date 7/30) -Will follow renal function, cultures and clinical progress  Height: '4\' 9"'$  (144.8 cm) Weight: 82.4 kg (181 lb 10.5 oz) IBW/kg (Calculated) : 38.6  Temp (24hrs), Avg:98.5 F (36.9 C), Min:98 F (36.7 C), Max:98.9 F (37.2 C)  Recent Labs  Lab 04/13/22 0212 04/14/22 0527 04/14/22 1935 04/15/22 0409 04/16/22 0429 04/17/22 0303  WBC 5.9 5.8  --  5.5 5.4 7.0  CREATININE 0.90 0.95 0.98 1.02* 0.94 0.95     Estimated Creatinine Clearance: 44.6 mL/min (by C-G formula based on SCr of 0.95 mg/dL).    Allergies  Allergen Reactions   Aspirin Other (See Comments)    Reports GI bleed--pt is currently taking   Lisinopril Cough   Sudafed [Pseudoephedrine Hcl]     Pt reports she had drainage in throat that made her throat hurt.     Unasyn 7/6 >> 7/11; 7/21 >> (7/30) Vanc 7/19 >> 7/21 Cefepime 7/19 >>  7/21  7/18 UCx - multiple species present, suggest recollection 7/18 BCx - NG final  7/24 BCx - NGTD   Francena Hanly, PharmD Pharmacy Resident  04/17/2022 11:14 AM

## 2022-04-17 NOTE — Progress Notes (Addendum)
Daily Progress Note   Patient Name: Tara [name]       Date: 04/17/2022 DOB: 14-Nov-1945  Age: 76 y.o. MRN#: [id number] Attending Physician: Ashok Pall, MD Primary Care Physician: [name], MD Admit Date: 03/15/2022  Reason for Consultation/Follow-up: Establishing goals of care  Patient Profile/HPI:  Tara [name] is a 76 y.o. female with multiple medical problems including cardiomyopathy, history of A-fib on anticoagulation with Eliquis, CKD stage IIIb, history of breast cancer status post chemoradiation in 2008, who was admitted to the hospital 03/15/2022 with new onset seizures she had known meningioma, which was found to have increased in size on CT with surrounding edema.  Patient underwent bilateral formal craniotomy for meningioma resection on 03/24/2020.  She has had a prolonged hospitalization with minimal improvement neurologically. Her recovery is complicated by heart failure exacerbation, and likely aspiration pneumonia.  Palliative care was consulted to address goals.    Subjective: Chart reviewed including labs, progress notes, imaging. She remains NPO. On my eval she does not respond to any stimuli.  Called and spoke with [name]- she notes improved nursing care and improved communication from providers.  Notes that family saw her respond verbally to her spouse.  She and family continue to desire aggressive medical care in hopes that patient's mental status will be restored. Their goal is for Tara [name] to be able to return home.   Review of Systems  Unable to perform ROS: Mental status change     Physical Exam Vitals and nursing note reviewed.  Constitutional:      Appearance: She is ill-appearing.  Cardiovascular:     Rate and Rhythm: Normal rate.  Pulmonary:      Effort: Pulmonary effort is normal.  Neurological:     Comments: unresponsive             Vital Signs: BP 102/63 (BP Location: Left Wrist)   Pulse 94   Temp 98 F (36.7 C)   Resp (!) 26   Ht '4\' 9"'$  (1.448 m)   Wt 82.4 kg   SpO2 92%   BMI 39.31 kg/m  SpO2: SpO2: 92 % O2 Device: O2 Device: High Flow Nasal Cannula O2 Flow Rate: O2 Flow Rate (L/min): 10 L/min  Intake/output summary:  Intake/Output Summary (Last 24 hours) at 04/17/2022 1404 Last data filed at 04/17/2022  1307 Gross per 24 hour  Intake 1291.33 ml  Output 1100 ml  Net 191.33 ml    LBM: Last BM Date : 04/16/22 Baseline Weight: Weight: 85 kg Most recent weight: Weight: 82.4 kg       Palliative Assessment/Data: PPS: 10%      Patient Active Problem List   Diagnosis Date Noted   Palliative care encounter    Acute respiratory failure with hypoxia (HCC)    Aspiration into airway    Pressure injury of skin 04/07/2022   Thrombocytopenia (HCC)    Meningioma ([place]) 03/24/2022   Seizure ([place]) 03/15/2022   Atrial fibrillation, chronic ([place]) 03/15/2022   Protein-calorie malnutrition, moderate (HCC) 03/15/2022   Atrial fibrillation with RVR (HCC)    Metabolic acidosis 12/04/9456   Acute renal failure superimposed on stage 3b chronic kidney disease ([place]) 02/27/2022   Anxiety and depression 02/27/2022   Meningioma, cerebral ([place]) 02/27/2022   Normocytic anemia 02/27/2022   Acute renal failure (ARF) ([place]) 02/27/2022   Hemorrhoids 01/04/2021   Ankle deformity, right 09/12/2020   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   Port-A-Cath in place 09/28/2018   Colon adenomas 08/06/2018   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 ([place]) 06/09/2018   Hypertension 01/25/2018   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   Demand ischemia (HCC)    Prolonged Q-T interval on ECG 09/29/2016   Chronic kidney disease (CKD), stage III (moderate) (HCC) 09/29/2016   Leukocytosis 09/18/2016   Abnormal CT scan,  chest    Upper airway cough syndrome  vs Cough variant asthma  04/09/2016   Seasonal allergies 08/08/2014   Family history of colon cancer 03/30/2014   Osteoporosis 59/29/2446   Metabolic syndrome X 28/63/8177   SVT (supraventricular tachycardia) ([place]) 11/65/7903   Chronic systolic CHF (congestive heart failure) ([place]) 03/14/2013   Vitamin D deficiency 07/23/2010   Morbid obesity ([place]) 07/04/2009   Malignant neoplasm of right breast ([place]) 12/13/2007   Hyperlipidemia 12/13/2007   Anxiety state 12/13/2007   Depression, major, single episode, in partial remission ([place]) 12/13/2007   GERD 12/13/2007    Palliative Care Assessment & Plan    Assessment/Recommendations/Plan  Per chart review- cause for her mental status changes remain unclear-  Family hears from Dr. Christella Noa that he believes she can still make a full recovery - their GOC is to restore her to a state that she can return home- she would not wish to die in a hospital if medical team felt she was approaching end of life PMT will continue to follow and support family Will likely need to begin discussion regarding long term feeding tube soon if patient's mental status does not improve  Code Status: Full code  Prognosis:  Unable to determine  Discharge Planning: To Be Determined   Thank you for allowing the Palliative Medicine Team to assist in the care of this patient.  Total time: 60 mins  Greater than 50%  of this time was spent counseling and coordinating care related to the above assessment and plan.  Mariana Kaufman, AGNP-C Palliative Medicine   Please contact Palliative Medicine Team phone at [phone] for questions and concerns.

## 2022-04-17 NOTE — Progress Notes (Addendum)
STROKE TEAM PROGRESS NOTE   INTERVAL HISTORY Patient is seen in her room with no family at the bedside.  She was originally admitted with new onset seizures and found to have a large meningioma.  This was resected on 7/3.  She did have some lip smacking movements postoperatively, but these were determined to be due to tardive dyskinesia and are not associated with any seizure activity on EEG.  On 7/25, an MRI was performed, and patient was found to have new scattered infarcts in bilateral cerebral hemispheres and left cerebellum.  Vitals:   04/17/22 1103 04/17/22 1141 04/17/22 1306 04/17/22 1312  BP: 102/63     Pulse: 91  97 94  Resp: 20  (!) 26 (!) 26  Temp: 98 F (36.7 C) 98 F (36.7 C)    TempSrc: Oral     SpO2: 95%  (!) 81% 92%  Weight:      Height:       CBC:  Recent Labs  Lab 04/16/22 0429 04/17/22 0303  WBC 5.4 7.0  NEUTROABS 4.3 5.5  HGB 9.9* 10.1*  HCT 30.2* 31.8*  MCV 84.1 86.2  PLT 163 517   Basic Metabolic Panel:  Recent Labs  Lab 04/12/22 0422 04/13/22 0212 04/15/22 0409 04/16/22 0429 04/17/22 0303  NA 140   < > 143 142 147*  K 3.0*   < > 3.4* 3.4* 3.2*  CL 105   < > 110 107 111  CO2 25   < > _0 GLUCOSE 132*   < > 93 142* 134*  BUN 40*   < > 27* 29* 31*  CREATININE 0.95   < > 1.02* 0.94 0.95  CALCIUM 8.2*   < > 8.1* 8.0* 8.4*  MG 2.2  --  2.0  --   --    < > = values in this interval not displayed.   Lipid Panel: No results for input(s): "CHOL", "TRIG", "HDL", "CHOLHDL", "VLDL", "LDLCALC" in the last 168 hours. HgbA1c: No results for input(s): "HGBA1C" in the last 168 hours. Urine Drug Screen: No results for input(s): "LABOPIA", "COCAINSCRNUR", "LABBENZ", "AMPHETMU", "THCU", "LABBARB" in the last 168 hours.  Alcohol Level No results for input(s): "ETH" in the last 168 hours.  IMAGING past 24 hours ECHOCARDIOGRAM LIMITED  Result Date: 04/17/2022    ECHOCARDIOGRAM LIMITED REPORT   Patient Name:   Tara [name] Date of Exam: 04/17/2022 Medical  Rec #:  [id number]      Height:       57.0 in Accession #:    [id number]     Weight:       181.7 lb Date of Birth:  [birth year]      BSA:          1.727 m Patient Age:    76 years       BP:           103/61 mmHg Patient Gender: F              HR:           80 bpm. Exam Location:  Inpatient Procedure: Limited Echo and Intracardiac Opacification Agent Indications:    Stroke  History:        Patient has prior history of Echocardiogram examinations, most                 recent 02/28/2022. CHF, Signs/Symptoms:Dyspnea; Risk  Factors:Hypertension and Dyslipidemia. H/O breast cancer treated                 with chemo-radiation.  Sonographer:    Merrie Roof RDCS Referring Phys: Josetta Huddle PATEL IMPRESSIONS  1. Left ventricular ejection fraction, by estimation, is 40 to 45%. The left ventricle has mildly decreased function. The left ventricle demonstrates global hypokinesis. Left ventricular diastolic function could not be evaluated.  2. Mildly D-shaped interventricular septum suggestive of RV pressure/volume overload. Right ventricular systolic function is moderately reduced. The right ventricular size is mildly enlarged. Tricuspid regurgitation signal is inadequate for assessing PA  pressure.  3. The inferior vena cava is normal in size with greater than 50% respiratory variability, suggesting right atrial pressure of 3 mmHg.  4. Limited echo FINDINGS  Left Ventricle: Left ventricular ejection fraction, by estimation, is 40 to 45%. The left ventricle has mildly decreased function. The left ventricle demonstrates global hypokinesis. Definity contrast agent was given IV to delineate the left ventricular  endocardial borders. There is no left ventricular hypertrophy. Left ventricular diastolic function could not be evaluated. Right Ventricle: Mildly D-shaped interventricular septum suggestive of RV pressure/volume overload. The right ventricular size is mildly enlarged. Right ventricular systolic function is  moderately reduced. Tricuspid regurgitation signal is inadequate for  assessing PA pressure. Left Atrium: Left atrial size was normal in size. Right Atrium: Right atrial size was normal in size. Venous: The inferior vena cava is normal in size with greater than 50% respiratory variability, suggesting right atrial pressure of 3 mmHg.  LV Volumes (MOD) LV vol d, MOD A2C: 66.2 ml LV vol d, MOD A4C: 45.8 ml LV vol s, MOD A2C: 28.5 ml LV vol s, MOD A4C: 22.4 ml LV SV MOD A2C:     37.7 ml LV SV MOD A4C:     45.8 ml LV SV MOD BP:      29.5 ml Dalton McleanMD Electronically signed by Franki Monte Signature Date/Time: 04/17/2022/1:29:28 PM    Final    CT ANGIO HEAD NECK W WO CM  Result Date: 04/17/2022 CLINICAL DATA:  Stroke/TIA, determine embolic source EXAM: CT ANGIOGRAPHY HEAD AND NECK TECHNIQUE: Multidetector CT imaging of the head and neck was performed using the standard protocol during bolus administration of intravenous contrast. Multiplanar CT image reconstructions and MIPs were obtained to evaluate the vascular anatomy. Carotid stenosis measurements (when applicable) are obtained utilizing NASCET criteria, using the distal internal carotid diameter as the denominator. RADIATION DOSE REDUCTION: This exam was performed according to the departmental dose-optimization program which includes automated exposure control, adjustment of the mA and/or kV according to patient size and/or use of iterative reconstruction technique. CONTRAST:  75 mL of Omnipaque 350 IV. COMPARISON:  CT head 04/09/2022. FINDINGS: CT HEAD FINDINGS Brain: Postoperative changes of previous bifrontal craniotomy for resection of a planum sphenoidale meningioma. Expected interval evolution with diminished pneumocephalus and similar small volume of extra-axial hemorrhage. Developing encephalomalacia at the resection site. No evidence of acute large vascular territory infarct, new/interval hemorrhage, mass lesion, midline shift or hydrocephalus  Vascular: Detailed below. Skull: Bifrontal craniotomy. Sinuses: Mild paranasal sinus mucosal thickening. Orbits: No acute finding. Review of the MIP images confirms the above findings CTA NECK FINDINGS Aortic arch: Standard branching. Imaged portion shows no evidence of aneurysm or dissection. No significant stenosis of the major arch vessel origins. Motion limited assessment. Right carotid system: No evidence of dissection, stenosis (50% or greater) or occlusion. 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 Left carotid system: No evidence of dissection, stenosis (50% or greater) or occlusion. Vertebral arteries: Codominant. No evidence of dissection, stenosis (50% or greater) or occlusion. Skeleton: No acute findings Other neck: No acute findings. Upper chest: Patchy consolidative and ground-glass opacities in the visualized lung apices bilaterally. Review of the MIP images confirms the above findings CTA HEAD FINDINGS Anterior circulation: Hypoplastic or absent left A1 ACA, probably congenital given prominent right A1 ACA. Bilateral intracranial ICAs, MCAs, and ACAs patent without proximal hemodynamically significant stenosis. Moderate distal right ACA narrowing. Posterior circulation: Bilateral intradural vertebral arteries basilar artery and bilateral posterior cerebral arteries are patent without proximal hemodynamically significant stenosis. Venous sinuses: As permitted by contrast timing, patent. Anatomic variants: Detailed above. Review of the MIP images confirms the above findings IMPRESSION: CT head: Expected evolution of postoperative changes, detailed above and including similar small volume of extra-axial hemorrhage. No evidence of new/interval acute abnormality by CT head. An MRI could provide more sensitive evaluation if clinically warranted. CTA: 1. No emergent large vessel occlusion or proximal hemodynamically  significant stenosis. 2. 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 3. Patchy consolidative and ground-glass opacities in the visualized lung apices bilaterally, further assessed on recent CT chest. Electronically Signed   By: Margaretha Sheffield M.D.   On: 04/17/2022 10:17   DG CHEST PORT 1 VIEW  Result Date: 04/16/2022 CLINICAL DATA:  Shortness of breath EXAM: PORTABLE CHEST 1 VIEW COMPARISON:  CT April 13, 2022 and chest radiograph April 11, 2022 FINDINGS: Enteric feeding catheter courses below the diaphragm with tip obscured by collimation. Right axillary surgical clips. Cholecystectomy clips. Similar cardiac enlargement with central vascular prominence. Worsened bilateral interstitial and airspace opacities. No visible pleural effusion or pneumothorax. No acute osseous abnormality. IMPRESSION: Similar cardiac enlargement and central vascular prominence with worsened bilateral interstitial and airspace opacities favored to reflect pulmonary edema. Electronically Signed   By: Dahlia Bailiff M.D.   On: 04/16/2022 14:50    PHYSICAL EXAM General:  Somnolent, well-developed, well-nourished patient in no acute distress.  Frontal craniotomy incision on top of head Respiratory:  Regular, unlabored respirations on supplemental O2 Neurological:  PERRL, eye movements restricted and does not track objects.  Blinks to threat bilaterally.  No movement to noxious stimuli.  ASSESSMENT/PLAN [name] is a 76 y.o. female with history of anxiety, depression, HTN, HLD, cardiomyopathy, atrial fibrillation on Eliquis, CHF and BRCA who was originally admitted with new onset seizures and found to have a large meningioma.  This was resected on 7/3.  She did have some lip smacking movements postoperatively, but these were determined to be due to tardive dyskinesia and are not associated with any seizure activity on EEG.  On 7/25, an MRI was performed, and patient was found to have new  scattered infarcts in bilateral cerebral hemispheres and left cerebellum.  Of note, patient is on Eliquis for atrial fibrillation but is only taking 2.5 mg BID.  May need to increase dose to 5 mg.  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 LDL pending HgbA1c pending VTE prophylaxis - fully anticoagulated with Eliquis Eliquis (apixaban) daily prior to admission, now on Eliquis (apixaban) daily. Given now Hb stable, SDH has been going on for 3 weeks, tolerating with intermittent AC, will increase eliquis to 52m bid therapeutic dose.  Therapy recommendations:  SNF Disposition:  pending  Chronic AF  Anticoagulation use Pt discharged on 6/22 with eliquis 2.5 bid due to elevated Cre Over time her Cre gradually trending down  She was put on [facility] on [facility] and later subdural hematoma postsurgery Restarted Eliquis 7/13 to 7/15 for 2.5 twice daily Eliquis changed to 5 mg twice daily from 7/16 to 7/22. However, Eliquis on hold due to hemoglobin drop to 6.5 on 7/22 After PRBC transfusion, hemoglobin stabilized.  Eliquis resumed on 7/26 but on 2.5 mg twice daily. Now Eliquis will increase to 5 mg twice daily given stabilized hemoglobin  Frontal meningioma Seizure S/p meningioma resection Neurosurgery on board On Keppra and Vimpat Last EEG 7/11 no  seizure   Hypotension Home meds:  none Stable On Lasix and midodrine Off pressor Long-term BP goal normotensive  Hyperlipidemia Home meds:  rosuvastatin 20 mg daily LDL pending goal < 70 Crestor 20 resumed so far Continue statin at discharge  Diabetes type II Controlled Home meds:  jardiance 10 mg daily HgbA1c pending, goal < 7.0 CBGs SSI  Other Stroke Risk Factors Advanced Age >/= 6110 Obesity, Body mass index is 39.31 kg/m., BMI >/= 30 associated with increased stroke risk, recommend weight loss, diet and exercise as appropriate   Other Active Problems Hypernatremia, sodium 147, on free water  Hospital day # 3[place], MSN, AGACNP-BC [facility] See Amion for schedule and pager information 04/17/2022 2:28 PM  ATTENDING NOTE: I reviewed above note and agree with the assessment and plan. Pt was seen and examined.   76year old female with history of hypertension, hyperlipidemia, CHF, A-fib on Eliquis, frontal meningioma and breast cancer admitted for seizure on 03/15/2022.  She was loaded with Keppra and Vimpat.  MRI brain negative for stroke at that time.  She had craniotomy for meningioma resection on 03/24/2022.  Postop found to have posterior fossa SDH and questionable recurrent seizure but EEG showed no seizure, concerning for possible tardive dyskinesia.  MRI 7/25 showed scattered punctate infarct bilateral hemisphere and left cerebellum.  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  EF 40 to 45%.  LE venous Doppler negative for DVT.  LDL and A1c pending, creatinine 0.95 sodium 147.  On exam, patient obtunded, not open eyes on voice, not follow commands.  PERRL, eyes rolling movement and does not track objects.  Minimal blinking to visual threat bilaterally.  Corneal and cough and gag present.  No movement to noxious stimuli.  Etiology for patient stroke likely due to A-fib with intermittent anticoagulation (see  above) due to interruption from surgery, SDH and severe anemia.  Currently hemoglobin stable, SDH out for 3 weeks, will increase Eliquis from 2.5 to 5 mg regular dose.  Continue Keppra and Vimpat for seizure prevention.  Hypernatremia on free water.  Continue Crestor.  BP soft, on midodrine.  PT therapy recommend SNF.  Palliative care  is on board, currently full code.  Will follow.  For detailed assessment and plan, please refer to above/below as I have made changes wherever appropriate.   Rosalin Hawking, MD PhD Stroke Neurology 04/17/2022 8:20 PM  I had extensive discussion with pharmacy regarding medication dosage.  I spent extensive face-to-face time with the patient, more than 50% of which was spent in counseling and coordination of care, reviewing test results, images and medication, and discussing the diagnosis, treatment plan and potential prognosis. This patient's care requiresreview of multiple databases, neurological assessment, discussion with family, other specialists and medical decision making of high complexity.       To contact Stroke Continuity provider, please refer to [email or web address] After hours, contact General Neurology

## 2022-04-17 NOTE — TOC CAGE-AID Note (Signed)
Transition of Care [facility]) - CAGE-AID Screening   Patient Details  Name: Tara [name] MRN: [id number] Date of Birth: 1946/03/14  Transition of Care [facility]) CM/SW Contact:    Coralee Pesa, [place] Phone Number: 04/17/2022, 9:42 AM   Clinical Narrative: Per chart review, pt is minimally responsive and not appropriate for assessment at this time.   CAGE-AID Screening: Substance Abuse Screening unable to be completed due to: : Patient unable to participate (Minimally responsive)

## 2022-04-17 NOTE — Progress Notes (Signed)
VASCULAR LAB    Bilateral lower extremity venous duplex has been performed.  See CV proc for preliminary results.   Paolina Karwowski, RVT 04/17/2022, 3:36 PM

## 2022-04-18 ENCOUNTER — Inpatient Hospital Stay (HOSPITAL_COMMUNITY): Payer: Medicare Other

## 2022-04-18 DIAGNOSIS — D320 Benign neoplasm of cerebral meninges: Secondary | ICD-10-CM | POA: Diagnosis not present

## 2022-04-18 DIAGNOSIS — T17908D Unspecified foreign body in respiratory tract, part unspecified causing other injury, subsequent encounter: Secondary | ICD-10-CM | POA: Diagnosis not present

## 2022-04-18 DIAGNOSIS — J9601 Acute respiratory failure with hypoxia: Secondary | ICD-10-CM | POA: Diagnosis not present

## 2022-04-18 DIAGNOSIS — I482 Chronic atrial fibrillation, unspecified: Secondary | ICD-10-CM | POA: Diagnosis not present

## 2022-04-18 DIAGNOSIS — R569 Unspecified convulsions: Secondary | ICD-10-CM | POA: Diagnosis not present

## 2022-04-18 LAB — CBC WITH DIFFERENTIAL/PLATELET
Abs Immature Granulocytes: 0.3 10*3/uL — ABNORMAL HIGH (ref 0.00–0.07)
Basophils Absolute: 0.1 10*3/uL (ref 0.0–0.1)
Basophils Relative: 1 %
Eosinophils Absolute: 0 10*3/uL (ref 0.0–0.5)
Eosinophils Relative: 0 %
HCT: 31.3 % — ABNORMAL LOW (ref 36.0–46.0)
Hemoglobin: 10 g/dL — ABNORMAL LOW (ref 12.0–15.0)
Lymphocytes Relative: 14 %
Lymphs Abs: 1.2 10*3/uL (ref 0.7–4.0)
MCH: 27.2 pg (ref 26.0–34.0)
MCHC: 31.9 g/dL (ref 30.0–36.0)
MCV: 85.3 fL (ref 80.0–100.0)
Metamyelocytes Relative: 1 %
Monocytes Absolute: 0.2 10*3/uL (ref 0.1–1.0)
Monocytes Relative: 2 %
Myelocytes: 2 %
Neutro Abs: 6.9 10*3/uL (ref 1.7–7.7)
Neutrophils Relative %: 80 %
Platelets: 212 10*3/uL (ref 150–400)
RBC: 3.67 MIL/uL — ABNORMAL LOW (ref 3.87–5.11)
RDW: 18.5 % — ABNORMAL HIGH (ref 11.5–15.5)
WBC: 8.6 10*3/uL (ref 4.0–10.5)
nRBC: 0 /100 WBC
nRBC: 0.2 % (ref 0.0–0.2)

## 2022-04-18 LAB — BASIC METABOLIC PANEL
Anion gap: 9 (ref 5–15)
BUN: 30 mg/dL — ABNORMAL HIGH (ref 8–23)
CO2: 29 mmol/L (ref 22–32)
Calcium: 8.8 mg/dL — ABNORMAL LOW (ref 8.9–10.3)
Chloride: 111 mmol/L (ref 98–111)
Creatinine, Ser: 1 mg/dL (ref 0.44–1.00)
GFR, Estimated: 58 mL/min — ABNORMAL LOW (ref >60)
Glucose, Bld: 132 mg/dL — ABNORMAL HIGH (ref 70–99)
Potassium: 4.2 mmol/L (ref 3.5–5.1)
Sodium: 149 mmol/L — ABNORMAL HIGH (ref 135–145)

## 2022-04-18 LAB — LIPID PANEL
Cholesterol: 73 mg/dL (ref 0–200)
HDL: 18 mg/dL — ABNORMAL LOW (ref >40)
LDL Cholesterol: 38 mg/dL (ref 0–99)
Total CHOL/HDL Ratio: 4.1 RATIO
Triglycerides: 85 mg/dL (ref <150)
VLDL: 17 mg/dL (ref 0–40)

## 2022-04-18 LAB — GLUCOSE, CAPILLARY
Glucose-Capillary: 125 mg/dL — ABNORMAL HIGH (ref 70–99)
Glucose-Capillary: 143 mg/dL — ABNORMAL HIGH (ref 70–99)
Glucose-Capillary: 144 mg/dL — ABNORMAL HIGH (ref 70–99)
Glucose-Capillary: 156 mg/dL — ABNORMAL HIGH (ref 70–99)
Glucose-Capillary: 156 mg/dL — ABNORMAL HIGH (ref 70–99)

## 2022-04-18 LAB — HEMOGLOBIN A1C
Hgb A1c MFr Bld: 5.9 % — ABNORMAL HIGH (ref 4.8–5.6)
Mean Plasma Glucose: 122.63 mg/dL

## 2022-04-18 MED ORDER — APIXABAN 5 MG PO TABS
5.0000 mg | ORAL_TABLET | Freq: Two times a day (BID) | ORAL | Status: DC
  Administered 2022-04-18 – 2022-05-05 (×35): 5 mg
  Filled 2022-04-18 (×35): qty 1

## 2022-04-18 MED ORDER — FREE WATER
200.0000 mL | Status: DC
  Administered 2022-04-18 – 2022-04-23 (×24): 200 mL

## 2022-04-18 MED ORDER — IPRATROPIUM-ALBUTEROL 0.5-2.5 (3) MG/3ML IN SOLN
3.0000 mL | Freq: Two times a day (BID) | RESPIRATORY_TRACT | Status: DC
  Administered 2022-04-18: 3 mL via RESPIRATORY_TRACT
  Filled 2022-04-18: qty 3

## 2022-04-18 NOTE — Procedures (Signed)
Patient Name: Tara [name]  MRN: [id number]  Epilepsy Attending: Lora Havens  Referring Physician/Provider: Rikki Spearing, NP Date: 04/18/2022 Duration: 27.31 mins   Patient history: 76 y.o. female with past medical history of anxiety, depression, GERD, hypertension, hyperlipidemia, nonischemic cardiomyopathy, atrial fibrillation on eliquis, Systolic HF, hx of breast cancer, meningioma who presented to the hospital on 03/15/2022 for evaluation of new onset seizures.   Level of alertness: Lethargic   AEDs during EEG study: LEV, LCM   Technical aspects: This EEG study was done with scalp electrodes positioned according to the 10-20 International system of electrode placement. Electrical activity was acquired at a sampling rate of '500Hz'$  and reviewed with a high frequency filter of '70Hz'$  and a low frequency filter of '1Hz'$ . EEG data were recorded continuously and digitally stored.    Description: No clear posterior dominant rhythm was seen.  EEG showed continuous 3 to 5 Hz theta-delta slowing. Hyperventilation and photic stimulation were not performed.     ABNORMALITY - Continuous slow, generalized   IMPRESSION: This study is suggestive moderate diffuse encephalopathy, nonspecific etiology.  No seizures or epileptiform discharges were seen throughout the recording.   Abdullah Rizzi Barbra Sarks

## 2022-04-18 NOTE — Progress Notes (Addendum)
Patient ID: Tara [name], female   DOB: [birth year], 76 y.o.   MRN: [id number] Not speaking, eyes open Moving all extremities Perrl,  No neurological changes.  Eeg is negative as expected. Does not to be repeated unless there is something new on her exam.

## 2022-04-18 NOTE — Progress Notes (Signed)
Occupational Therapy Treatment Patient Details Name: Tara [name] MRN: [id number] DOB: 1945-11-03 Today's Date: 04/18/2022   History of present illness 76 yo woman admitted 03/15/22 with new onset seizures related to meningioma. s/p tumor resection 7/3. Recent DC from [facility] 6/22. MRI on 7/26 shows: SDH extending inferiorly along the clivus and  narrowing the foramen magnum with possible mass effect. PMH: CKD stage 3, HFpEF, a fib, meningioma.   OT comments  Pt seen in conjunction with PT to maximize pts activity tolerance and participation although pt making no efforts to engage in session. Total A +2 for all aspects of bed mobility to roll and place lift pad, total A lift via maximove to chair. Pt needs total A for ADLS at this time, tried various methods of arousal including playing music of choice and providing multimodal stimuli with no success.  Encouraged husband to continue to talk to pt and work towards purposeful arousal. Pt would continue to benefit from skilled occupational therapy while admitted and after d/c to address the below listed limitations in order to improve overall functional mobility and facilitate independence with BADL participation. DC plan remains appropriate, will follow acutely per POC.     Recommendations for follow up therapy are one component of a multi-disciplinary discharge planning process, led by the attending physician.  Recommendations may be updated based on patient status, additional functional criteria and insurance authorization.    Follow Up Recommendations  Skilled nursing-short term rehab (<3 hours/day)    Assistance Recommended at Discharge Frequent or constant Supervision/Assistance  Patient can return home with the following  Two people to help with walking and/or transfers;Two people to help with bathing/dressing/bathroom;Assistance with cooking/housework;Assistance with feeding;Direct supervision/assist for medications management;Direct  supervision/assist for financial management;Assist for transportation;Help with stairs or ramp for entrance   Equipment Recommendations  Other (comment) (tbd)    Recommendations for Other Services      Precautions / Restrictions Precautions Precautions: Fall Precaution Comments: seizures, multiple lines Restrictions Weight Bearing Restrictions: No       Mobility Bed Mobility Overal bed mobility: Needs Assistance Bed Mobility: Rolling Rolling: Total assist         General bed mobility comments: totalA to complete rolling in bed for lift pad placement. no attempt to assist with repositioning of limbs or attempt to pull/push to initiate or maintain roll    Transfers Overall transfer level: Needs assistance Equipment used: Ambulation equipment used Transfers: Bed to chair/wheelchair/BSC             General transfer comment: pt lifted to chair with no change in HR, BP, or response Transfer via Lift Equipment: Maximove   Balance Overall balance assessment: Needs assistance Sitting-balance support: Feet supported Sitting balance-Leahy Scale: Poor Sitting balance - Comments: dependent on posterior support at this time, unable to maintain upright position without support                                   ADL either performed or assessed with clinical judgement   ADL Overall ADL's : Needs assistance/impaired     Grooming: Wash/dry face;Total assistance Grooming Details (indicate cue type and reason): pt making no efforts to attempt to wash face even with hand over hand assist             Lower Body Dressing: Total assistance;Bed level   Toilet Transfer: Total assistance Toilet Transfer Details (indicate cue type and reason): simulated via  maximove lift to recliner         Functional mobility during ADLs: Total assistance (total A lift to recliner with maxi move) General ADL Comments: pt requires total A for all aspects of ADLS d/t decreased  level of arousal    Extremity/Trunk Assessment Upper Extremity Assessment Upper Extremity Assessment: RUE deficits/detail;LUE deficits/detail RUE Deficits / Details: Full PROM, no attempt to participate in AAROM. No attempt to use righting reaction or spontaneous movements. Pt with increased tone throughout RUE with tight scapula RUE Sensation:  (unable to assess d/t cog) RUE Coordination: decreased fine motor;decreased gross motor LUE Deficits / Details: decreased PROM, no attempt at active movement, tight in shoulder.  Increased tone noted. increased edema in LUE, tremors noted in LUE with PROM LUE Sensation:  (unable to assess d/t cog, did not respond to pain) LUE Coordination: decreased fine motor;decreased gross motor   Lower Extremity Assessment Lower Extremity Assessment: Defer to PT evaluation        Vision Baseline Vision/History: 1 Wears glasses Ability to See in Adequate Light: 0 Adequate Patient Visual Report: No change from baseline (difficult to assess d/t cog)     Perception     Praxis      Cognition Arousal/Alertness: Lethargic Behavior During Therapy: Flat affect Overall Cognitive Status: Impaired/Different from baseline Area of Impairment: Orientation, Attention, Memory, Following commands, Safety/judgement, Problem solving, Awareness                 Orientation Level:  (no verbalizations or commands followed) Current Attention Level: Focused Memory: Decreased recall of precautions, Decreased short-term memory Following Commands: Follows one step commands inconsistently, Follows one step commands with increased time Safety/Judgement: Decreased awareness of deficits, Decreased awareness of safety Awareness: Intellectual Problem Solving: Slow processing, Decreased initiation, Difficulty sequencing, Requires verbal cues, Requires tactile cues General Comments: pt with small movement of L 3rd and 4th toes to command x2, no other instances of command  following or tracking of verbal or tactile stimuli noted during session, pt initially seemed like she was attempting to move her lips but difficult to determine if movement was pursposeful or spontaneous d/t dry lips        Exercises Other Exercises Other Exercises: PROM to BUEs, tremors in LUE with movement, increased edema in LUE, increased tone in RUE    Shoulder Instructions       General Comments VSS on 10 L    Pertinent Vitals/ Pain       Pain Assessment Pain Assessment: Faces Faces Pain Scale: No hurt  Home Living                                          Prior Functioning/Environment              Frequency  Min 2X/week        Progress Toward Goals  OT Goals(current goals can now be found in the care plan section)  Progress towards OT goals: Not progressing toward goals - comment (limited participation)  Acute Rehab OT Goals OT Goal Formulation: Patient unable to participate in goal setting Time For Goal Achievement: 04/30/22 Potential to Achieve Goals: [place] Discharge plan remains appropriate;Frequency remains appropriate    Co-evaluation      Reason for Co-Treatment: Complexity of the patient's impairments (multi-system involvement);Necessary to address cognition/behavior during functional activity;For patient/therapist safety;To address functional/ADL transfers PT goals addressed  during session: Mobility/safety with mobility;Balance;Strengthening/ROM OT goals addressed during session: ADL's and self-care;Strengthening/ROM (PROM)      AM-PAC OT "6 Clicks" Daily Activity     Outcome Measure   Help from another person eating meals?: Total Help from another person taking care of personal grooming?: Total Help from another person toileting, which includes using toliet, bedpan, or urinal?: Total Help from another person bathing (including washing, rinsing, drying)?: Total Help from another person to put on and taking off regular  upper body clothing?: Total Help from another person to put on and taking off regular lower body clothing?: Total 6 Click Score: 6    End of Session Equipment Utilized During Treatment: Oxygen;Other (comment) (10 L; maximove)  OT Visit Diagnosis: Unsteadiness on feet (R26.81);Other abnormalities of gait and mobility (R26.89);Muscle weakness (generalized) (M62.81);Low vision, both eyes (H54.2);Other symptoms and signs involving cognitive function;Pain   Activity Tolerance Patient tolerated treatment well;Patient limited by lethargy   Patient Left in chair;with call bell/phone within reach;with chair alarm set;with family/visitor present   Nurse Communication Mobility status;Need for lift equipment        Time: 5038-8828 OT Time Calculation (min): 33 min  Charges: OT General Charges $OT Visit: 1 Visit OT Treatments $Self Care/Home Management : 8-22 mins  Harley Alto., COTA/L Acute Rehabilitation Services [phone]   Precious Haws 04/18/2022, 9:44 AM

## 2022-04-18 NOTE — Progress Notes (Signed)
EEG complete - results pending 

## 2022-04-18 NOTE — Progress Notes (Signed)
Physical Therapy Treatment Patient Details Name: Tara [name] MRN: [id number] DOB: 1946-04-27 Today's Date: 04/18/2022   History of Present Illness 76 yo woman admitted 03/15/22 with new onset seizures related to meningioma. s/p tumor resection 7/3. Recent DC from [facility] 6/22. MRI on 7/26 shows: SDH extending inferiorly along the clivus and  narrowing the foramen magnum with possible mass effect. PMH: CKD stage 3, HFpEF, a fib, meningioma.    PT Comments    Pt presents with eyes closed but did attempt verbal response to therapist upon arrival. The pt continues to require significant assist to complete rolling and repositioning, no active initiation of movement in any extremity to assist with rolling or to assist with leaning forwards or repositioning when sitting. Pt maintained eyes closed through session despite max cues and environmental stimuli. Possible attempt to follow single command x2 in session (slight movement of L 3rd and 4th toes) was only noted volitional movement. Increased flexion tone in bilateral UE. Will continue to benefit from skilled PT to progress functional movement and participation, SNF recommendation remains appropriate.     Recommendations for follow up therapy are one component of a multi-disciplinary discharge planning process, led by the attending physician.  Recommendations may be updated based on patient status, additional functional criteria and insurance authorization.  Follow Up Recommendations  Skilled nursing-short term rehab (<3 hours/day) Can patient physically be transported by private vehicle: No   Assistance Recommended at Discharge Frequent or constant Supervision/Assistance  Patient can return home with the following Assistance with cooking/housework;Assist for transportation;Direct supervision/assist for financial management;Two people to help with walking and/or transfers;Two people to help with bathing/dressing/bathroom;Help with stairs or ramp for  entrance   Equipment Recommendations  Wheelchair (measurements PT);Wheelchair cushion (measurements PT);Hospital bed;Other (comment)    Recommendations for Other Services       Precautions / Restrictions Precautions Precautions: Fall Precaution Comments: seizures, multiple lines Restrictions Weight Bearing Restrictions: No     Mobility  Bed Mobility Overal bed mobility: Needs Assistance Bed Mobility: Rolling Rolling: Total assist         General bed mobility comments: totalA to complete rolling in bed for lift pad placement. no attempt to assist with repositioning of limbs or attempt to pull/push to initiate or maintain roll    Transfers Overall transfer level: Needs assistance Equipment used: Ambulation equipment used Transfers: Bed to chair/wheelchair/BSC Sit to Stand: Total assist, From elevated surface           General transfer comment: pt lifted to chair with no change in HR, BP, or response Transfer via Lift Equipment: Maximove   Modified Rankin (Stroke Patients Only) Modified Rankin (Stroke Patients Only) Pre-Morbid Rankin Score: Moderately severe disability Modified Rankin: Severe disability     Balance Overall balance assessment: Needs assistance Sitting-balance support: Feet supported Sitting balance-Leahy Scale: Poor Sitting balance - Comments: dependent on posterior support at this time, unable to maintain upright position without support Postural control: Posterior lean, Left lateral lean                                  Cognition Arousal/Alertness: Lethargic Behavior During Therapy: Flat affect Overall Cognitive Status: Impaired/Different from baseline Area of Impairment: Orientation, Attention, Memory, Following commands, Safety/judgement, Problem solving, Awareness                 Orientation Level:  (pt is mute and not following commands) Current Attention Level: Focused Memory: Decreased  recall of precautions,  Decreased short-term memory Following Commands: Follows one step commands inconsistently, Follows one step commands with increased time Safety/Judgement: Decreased awareness of deficits, Decreased awareness of safety Awareness: Intellectual Problem Solving: Slow processing, Decreased initiation, Difficulty sequencing, Requires verbal cues, Requires tactile cues General Comments: pt with small movement of L 3rd and 4th toes to command x2, no other instances of command following or tracking of verbal or tactile stimuli noted during session        Exercises General Exercises - Upper Extremity Shoulder Flexion: PROM, Both, 5 reps, Seated Elbow Flexion: PROM, Both, 10 reps, Seated Elbow Extension: PROM, Both, 10 reps, Seated Wrist Flexion: PROM, Both, 5 reps, Seated Wrist Extension: PROM, Both, 5 reps, Seated Digit Composite Flexion: PROM, Both, 5 reps, Seated Composite Extension: PROM, Both, 5 reps, Seated General Exercises - Lower Extremity Ankle Circles/Pumps: PROM, Both, 10 reps, Supine Heel Slides: PROM, Both, 5 reps, Supine Hip Flexion/Marching: PROM, Both, 5 reps, Seated Other Exercises Other Exercises: PROM to neck into bilateral rotation and lateral flexion. less movement to L in both movements. minimally past midline    General Comments General comments (skin integrity, edema, etc.): VSS on 10L      Pertinent Vitals/Pain Pain Assessment Pain Assessment: Faces Faces Pain Scale: No hurt Pain Location: no grimacing with PROM Pain Intervention(s): Monitored during session     PT Goals (current goals can now be found in the care plan section) Acute Rehab PT Goals Patient Stated Goal: spouse present, more engaged with pt after therapists left PT Goal Formulation: With family Time For Goal Achievement: 04/30/22 Potential to Achieve Goals: Fair Progress towards PT goals: Progressing toward goals    Frequency    Min 2X/week      PT Plan Current plan remains  appropriate    Co-evaluation PT/OT/SLP Co-Evaluation/Treatment: Yes Reason for Co-Treatment: Complexity of the patient's impairments (multi-system involvement);Necessary to address cognition/behavior during functional activity;For patient/therapist safety;To address functional/ADL transfers PT goals addressed during session: Mobility/safety with mobility;Balance;Strengthening/ROM        AM-PAC PT "6 Clicks" Mobility   Outcome Measure  Help needed turning from your back to your side while in a flat bed without using bedrails?: Total Help needed moving from lying on your back to sitting on the side of a flat bed without using bedrails?: Total Help needed moving to and from a bed to a chair (including a wheelchair)?: Total Help needed standing up from a chair using your arms (e.g., wheelchair or bedside chair)?: Total Help needed to walk in hospital room?: Total Help needed climbing 3-5 steps with a railing? : Total 6 Click Score: 6    End of Session Equipment Utilized During Treatment: Oxygen Activity Tolerance: Patient tolerated treatment well Patient left: in bed;with family/visitor present;with bed alarm set Nurse Communication: Mobility status;Need for lift equipment PT Visit Diagnosis: Other abnormalities of gait and mobility (R26.89);Other symptoms and signs involving the nervous system (Y63.785)     Time: 8850-2774 PT Time Calculation (min) (ACUTE ONLY): 33 min  Charges:  $Therapeutic Exercise: 8-22 mins                    [place], PT, DPT   Acute Rehabilitation Department   Sandra Cockayne 04/18/2022, 9:07 AM

## 2022-04-18 NOTE — Progress Notes (Addendum)
STROKE TEAM PROGRESS NOTE   INTERVAL HISTORY Husband is at bedside during examination this morning. Patient is laying in bed, obtunded with ongoing chewing movements noted on examination. She was originally admitted with new onset seizures and found to have a large meningioma s/p resection 7/3.  She did have some lip smacking movements postoperatively, but these were determined to be due to tardive dyskinesia and are not associated with any seizure activity on EEG. On 7/25, an MRI was performed, and patient was found to have new scattered infarcts in bilateral cerebral hemispheres and left cerebellum.  Palliative care team is on board  Routine EEG ordered due to patient's mental status and ongoing chewing motion this AM  Vitals:   04/18/22 0400 04/18/22 0500 04/18/22 0600 04/18/22 0707  BP:    112/67  Pulse: 96 99 97 96  Resp: (!) 24 (!) 27 (!) 27 (!) 25  Temp:    99.9 F (37.7 C)  TempSrc:    Oral  SpO2: 94% 95% 95% 95%  Weight:      Height:       CBC:  Recent Labs  Lab 04/17/22 0303 04/18/22 0329  WBC 7.0 8.6  NEUTROABS 5.5 6.9  HGB 10.1* 10.0*  HCT 31.8* 31.3*  MCV 86.2 85.3  PLT 175 370    Basic Metabolic Panel:  Recent Labs  Lab 04/12/22 0422 04/13/22 0212 04/15/22 0409 04/16/22 0429 04/17/22 0303 04/18/22 0329  NA 140   < > 143   < > 147* 149*  K 3.0*   < > 3.4*   < > 3.2* 4.2  CL 105   < > 110   < > 111 111  CO2 25   < > 23   < > 27 29  GLUCOSE 132*   < > 93   < > 134* 132*  BUN 40*   < > 27*   < > 31* 30*  CREATININE 0.95   < > 1.02*   < > 0.95 1.00  CALCIUM 8.2*   < > 8.1*   < > 8.4* 8.8*  MG 2.2  --  2.0  --   --   --    < > = values in this interval not displayed.    Lipid Panel:  Recent Labs  Lab 04/18/22 0329  CHOL 73  TRIG 85  HDL 18*  CHOLHDL 4.1  VLDL 17  LDLCALC 38   HgbA1c:  Recent Labs  Lab 04/18/22 0329  HGBA1C 5.9*   Urine Drug Screen: No results for input(s): "LABOPIA", "COCAINSCRNUR", "LABBENZ", "AMPHETMU", "THCU",  "LABBARB" in the last 168 hours.  Alcohol Level No results for input(s): "ETH" in the last 168 hours.  IMAGING past 24 hours DG CHEST PORT 1 VIEW  Result Date: 04/18/2022 CLINICAL DATA:  Congestive heart failure EXAM: PORTABLE CHEST 1 VIEW COMPARISON:  Radiographs 04/16/2022 FINDINGS: Similar to slight improvement in patchy bilateral airspace and interstitial opacities compatible with edema. Remainder unchanged. Cardiomegaly. No pleural effusion or pneumothorax. Subdiaphragmatic enteric tube. Surgical clips right axilla. IMPRESSION: Similar to slight improvement in pulmonary edema.  Cardiomegaly. Electronically Signed   By: Placido Sou M.D.   On: 04/18/2022 08:12   VAS [place] LOWER EXTREMITY VENOUS (DVT)  Result Date: 04/17/2022  Lower Venous DVT Study Patient Name:  KIELYN KARDELL Nelis  Date of Exam:   04/17/2022 Medical Rec #: [id number]       Accession #:    [id number] Date of Birth: 1946-03-12  Patient Gender: F Patient Age:   76 years Exam Location:  [facility] Procedure:      VAS [place] LOWER EXTREMITY VENOUS (DVT) Referring Phys: Cornelius Moras Ola Fawver --------------------------------------------------------------------------------  Indications: Stroke.  Limitations: Interference with Doppler, unable to turn legs. Comparison Study: Prior Left lower extremity venous duplex done 09/29/16                   indicating DVT in the popliteal, PT, and Peroneal veins. Performing Technologist: Sharion Dove RVS  Examination Guidelines: A complete evaluation includes B-mode imaging, spectral Doppler, color Doppler, and power Doppler as needed of all accessible portions of each vessel. Bilateral testing is considered an integral part of a complete examination. Limited examinations for reoccurring indications may be performed as noted. The reflux portion of the exam is performed with the patient in reverse Trendelenburg.  +---------+---------------+---------+-----------+----------+--------------+ RIGHT     CompressibilityPhasicitySpontaneityPropertiesThrombus Aging +---------+---------------+---------+-----------+----------+--------------+ CFV      Full           Yes      Yes                                 +---------+---------------+---------+-----------+----------+--------------+ SFJ      Full                                                        +---------+---------------+---------+-----------+----------+--------------+ FV Prox  Full                                                        +---------+---------------+---------+-----------+----------+--------------+ FV Mid   Full                                                        +---------+---------------+---------+-----------+----------+--------------+ FV DistalFull                                                        +---------+---------------+---------+-----------+----------+--------------+ PFV      Full                                                        +---------+---------------+---------+-----------+----------+--------------+ POP                     Yes      Yes                                 +---------+---------------+---------+-----------+----------+--------------+ PTV      Full                                                        +---------+---------------+---------+-----------+----------+--------------+  PERO     Full                                                        +---------+---------------+---------+-----------+----------+--------------+   +---------+---------------+---------+-----------+----------+--------------+ LEFT     CompressibilityPhasicitySpontaneityPropertiesThrombus Aging +---------+---------------+---------+-----------+----------+--------------+ CFV      Full           Yes      Yes                                 +---------+---------------+---------+-----------+----------+--------------+ SFJ      Full                                                         +---------+---------------+---------+-----------+----------+--------------+ FV Prox  Full                                                        +---------+---------------+---------+-----------+----------+--------------+ FV Mid   Full                                                        +---------+---------------+---------+-----------+----------+--------------+ FV DistalFull                                                        +---------+---------------+---------+-----------+----------+--------------+ PFV      Full                                                        +---------+---------------+---------+-----------+----------+--------------+ POP                     Yes      Yes                                 +---------+---------------+---------+-----------+----------+--------------+ PTV      Full                                                        +---------+---------------+---------+-----------+----------+--------------+ PERO     Full                                                        +---------+---------------+---------+-----------+----------+--------------+  Summary: BILATERAL: - No evidence of deep vein thrombosis seen in the lower extremities, bilaterally. -No evidence of popliteal cyst, bilaterally.   *See table(s) above for measurements and observations.    Preliminary    ECHOCARDIOGRAM LIMITED  Result Date: 04/17/2022    ECHOCARDIOGRAM LIMITED REPORT   Patient Name:   ANBER MCKIVER Date of Exam: 04/17/2022 Medical Rec #:  [id number]      Height:       57.0 in Accession #:    [id number]     Weight:       181.7 lb Date of Birth:  1946-06-29      BSA:          1.727 m Patient Age:    59 years       BP:           103/61 mmHg Patient Gender: F              HR:           80 bpm. Exam Location:  Inpatient Procedure: Limited Echo and Intracardiac Opacification Agent Indications:    Stroke  History:        Patient has prior history  of Echocardiogram examinations, most                 recent 02/28/2022. CHF, Signs/Symptoms:Dyspnea; Risk                 Factors:Hypertension and Dyslipidemia. H/O breast cancer treated                 with chemo-radiation.  Sonographer:    Merrie Roof RDCS Referring Phys: Josetta Huddle PATEL IMPRESSIONS  1. Left ventricular ejection fraction, by estimation, is 40 to 45%. The left ventricle has mildly decreased function. The left ventricle demonstrates global hypokinesis. Left ventricular diastolic function could not be evaluated.  2. Mildly D-shaped interventricular septum suggestive of RV pressure/volume overload. Right ventricular systolic function is moderately reduced. The right ventricular size is mildly enlarged. Tricuspid regurgitation signal is inadequate for assessing PA  pressure.  3. The inferior vena cava is normal in size with greater than 50% respiratory variability, suggesting right atrial pressure of 3 mmHg.  4. Limited echo FINDINGS  Left Ventricle: Left ventricular ejection fraction, by estimation, is 40 to 45%. The left ventricle has mildly decreased function. The left ventricle demonstrates global hypokinesis. Definity contrast agent was given IV to delineate the left ventricular  endocardial borders. There is no left ventricular hypertrophy. Left ventricular diastolic function could not be evaluated. Right Ventricle: Mildly D-shaped interventricular septum suggestive of RV pressure/volume overload. The right ventricular size is mildly enlarged. Right ventricular systolic function is moderately reduced. Tricuspid regurgitation signal is inadequate for  assessing PA pressure. Left Atrium: Left atrial size was normal in size. Right Atrium: Right atrial size was normal in size. Venous: The inferior vena cava is normal in size with greater than 50% respiratory variability, suggesting right atrial pressure of 3 mmHg.  LV Volumes (MOD) LV vol d, MOD A2C: 66.2 ml LV vol d, MOD A4C: 45.8 ml LV vol s, MOD A2C:  28.5 ml LV vol s, MOD A4C: 22.4 ml LV SV MOD A2C:     37.7 ml LV SV MOD A4C:     45.8 ml LV SV MOD BP:      29.5 ml Dalton McleanMD Electronically signed by Franki Monte Signature Date/Time: 04/17/2022/1:29:28 PM    Final    CT ANGIO HEAD NECK W WO  CM  Result Date: 04/17/2022 CLINICAL DATA:  Stroke/TIA, determine embolic source EXAM: CT ANGIOGRAPHY HEAD AND NECK TECHNIQUE: Multidetector CT imaging of the head and neck was performed using the standard protocol during bolus administration of intravenous contrast. Multiplanar CT image reconstructions and MIPs were obtained to evaluate the vascular anatomy. Carotid stenosis measurements (when applicable) are obtained utilizing NASCET criteria, using the distal internal carotid diameter as the denominator. RADIATION DOSE REDUCTION: This exam was performed according to the departmental dose-optimization program which includes automated exposure control, adjustment of the mA and/or kV according to patient size and/or use of iterative reconstruction technique. CONTRAST:  75 mL of Omnipaque 350 IV. COMPARISON:  CT head 04/09/2022. FINDINGS: CT HEAD FINDINGS Brain: Postoperative changes of previous bifrontal craniotomy for resection of a planum sphenoidale meningioma. Expected interval evolution with diminished pneumocephalus and similar small volume of extra-axial hemorrhage. Developing encephalomalacia at the resection site. No evidence of acute large vascular territory infarct, new/interval hemorrhage, mass lesion, midline shift or hydrocephalus Vascular: Detailed below. Skull: Bifrontal craniotomy. Sinuses: Mild paranasal sinus mucosal thickening. Orbits: No acute finding. Review of the MIP images confirms the above findings CTA NECK FINDINGS Aortic arch: Standard branching. Imaged portion shows no evidence of aneurysm or dissection. No significant stenosis of the major arch vessel origins. Motion limited assessment. Right carotid system: No evidence of dissection,  stenosis (50% or greater) or occlusion. 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 Left carotid system: No evidence of dissection, stenosis (50% or greater) or occlusion. Vertebral arteries: Codominant. No evidence of dissection, stenosis (50% or greater) or occlusion. Skeleton: No acute findings Other neck: No acute findings. Upper chest: Patchy consolidative and ground-glass opacities in the visualized lung apices bilaterally. Review of the MIP images confirms the above findings CTA HEAD FINDINGS Anterior circulation: Hypoplastic or absent left A1 ACA, probably congenital given prominent right A1 ACA. Bilateral intracranial ICAs, MCAs, and ACAs patent without proximal hemodynamically significant stenosis. Moderate distal right ACA narrowing. Posterior circulation: Bilateral intradural vertebral arteries basilar artery and bilateral posterior cerebral arteries are patent without proximal hemodynamically significant stenosis. Venous sinuses: As permitted by contrast timing, patent. Anatomic variants: Detailed above. Review of the MIP images confirms the above findings IMPRESSION: CT head: Expected evolution of postoperative changes, detailed above and including similar small volume of extra-axial hemorrhage. No evidence of new/interval acute abnormality by CT head. An MRI could provide more sensitive evaluation if clinically warranted. CTA: 1. No emergent large vessel occlusion or proximal hemodynamically significant stenosis. 2. 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 3. Patchy consolidative and ground-glass opacities in the visualized lung apices bilaterally, further assessed on recent CT chest. Electronically Signed   By: Margaretha Sheffield M.D.   On: 04/17/2022 10:17    PHYSICAL EXAM General:  Well-developed, well-nourished patient in no acute distress.  Frontal craniotomy incision  on top of head Respiratory:  Regular, unlabored respirations on supplemental O2 via 10L high flow nasal cannula Neurological:  Somnolent, does not arouse to touch or loud voice.  PERRL, no spontaneous eye opening. Eyes are midline with examiner eye opening. She does not track. Blinks to the right, does not blink to threat on the left. No movement to noxious stimuli throughout. Increased tone throughout. She does have some slight chewing motions noted throughout examination but does not have any purposeful movements.   ASSESSMENT/PLAN  Ms. DINITA MIGLIACCIO is a 76 y.o. female with history of anxiety, depression, HTN, HLD, cardiomyopathy, atrial fibrillation on Eliquis, CHF and BRCA who was originally admitted with new onset seizures and found to have a large meningioma.  This was resected on 7/3.  She did have some lip smacking movements postoperatively, but these were determined to be due to tardive dyskinesia and are not associated with any seizure activity on EEG.  On 7/25, an MRI was performed, and patient was found to have new scattered infarcts in bilateral cerebral hemispheres and left cerebellum.  Of note, patient is on Eliquis for atrial fibrillation and was increased from 2.5 mg BID to 5 mg BID on 7/27.  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 LDL 38 HgbA1c 5.9 VTE prophylaxis - fully anticoagulated  with Eliquis Eliquis (apixaban) daily prior to admission, now on Eliquis (apixaban) daily. Given now Hb stable, SDH has been going on for 3 weeks, tolerating with intermittent AC, now on Eliquis at 554mbid therapeutic dose.  Therapy recommendations:  SNF Disposition:  pending  Chronic AF  Anticoagulation use Pt discharged on 6/22 with eliquis 2.5 bid due to elevated Cre Over time her Cre gradually trending down  She was put on [facility] on [facility] and later subdural hematoma postsurgery Restarted Eliquis 7/13 to 7/15 for 2.5 twice daily Eliquis changed to 5 mg twice daily from 7/16 to 7/22. However, Eliquis on hold due to hemoglobin drop to 6.5 on 7/22 After PRBC transfusion, hemoglobin stabilized.  Eliquis resumed on 7/26 but on 2.5 mg twice daily. Eliquis increased to 5 mg twice daily 7/27 given stabilized hemoglobin  Frontal meningioma Seizure S/p meningioma resection Neurosurgery on board On Keppra and Vimpat Last EEG 7/11 no seizure  Repeat routine EEG 7/28 due to AMS -moderate diffuse encephalopathy, no seizure  Hypotension Home meds:  none Stable On Lasix and midodrine Off pressor Long-term BP goal normotensive  Hyperlipidemia Home meds:  rosuvastatin 20 mg daily LDL at goal < 70 Crestor 20 resumed  Continue statin at discharge  Diabetes type II Controlled Home meds:  jardiance 10 mg daily HgbA1c 5.3 at goal of < 7.0 CBGs SSI  Other Stroke Risk Factors Advanced Age >/= 658Obesity, Body mass index is 39.31 kg/m., BMI >/= 30 associated with increased stroke risk, recommend weight loss, diet and exercise as appropriate   Other Active Problems Hypernatremia, sodium 149, on free water  Hospital day # [id number] Spearing MSN, AGACNP-BC [facility] See Amion for schedule and pager information 04/18/2022 9:16 AM  ATTENDING NOTE: I reviewed above note and agree with the assessment and plan. Pt  was seen and examined.   Husband at bedside.  He told me that patient was sitting in chair with PT/OT earlier today.  Currently patient lying in bed, not open eyes on voice, not follow commands, with forced eye opening, mild eye rolling  movement, seems minimally blinking to visual threat on the right but not on the left. PERRL, no significant facial asymmetry.  Bilateral upper extremity no strength against gravity, with mildly increased tone.  Bilateral lower extremity no movement on pain stimulation, with moderate increase in tone.  Neurologically no significant change from yesterday.  EEG showed moderate diffuse encephalopathy, no seizure.  Eliquis increased from 2.5 to 5 mg normal dose last night, so far tolerating well.  Continue Crestor.  Aggressive risk factor modification.  PT therapy recommend SNF.  Continue supportive care per primary team.  For detailed assessment and plan, please refer to above/below as I have made changes wherever appropriate.   Neurology will sign off. Please call with questions. Pt will follow up with stroke clinic NP at [facility] in about 4 weeks. Thanks for the consult.   Rosalin Hawking, MD PhD Stroke Neurology 04/18/2022 6:46 PM    To contact Stroke Continuity provider, please refer to [email or web address] After hours, contact General Neurology

## 2022-04-18 NOTE — Progress Notes (Signed)
SLP Cancellation Note  Patient Details Name: Tara [name] MRN: [id number] DOB: [birth year]   Cancelled treatment:        Attempted to see pt for ongoing swallowing management.  Pt unavailable for pt care and RN reports no change in mentation/level of alertness.  SLP will continue to follow for PO readiness as scheduler permits.   Celedonio Savage, [place], [place] Office: [phone] 04/18/2022, 12:51 PM

## 2022-04-18 NOTE — Progress Notes (Signed)
Arrived to room patient sitting up in chair. Moving patient back to be in an hour per RN. Will try back for EEG in 1 hr.

## 2022-04-18 NOTE — Progress Notes (Signed)
Progress Note for consult Patient: Tara [name] JKD:[id number] DOB: [birth year] DOA: 03/15/2022  DOS: the patient was seen and examined on 04/18/2022 Primary team Ashok Pall, MD   Brief hospital course: 75 year old African-American female PMH of anxiety, depression, type II DM, HLD, PAF, on Eliquis, CKD 3B, breast cancer SP chemoradiation presented to hospital on 6/24 with new onset seizures. Known history of sphenoidal meningioma that was noted to be increased in size on the CT scan with surrounding edema so she is transferred from [facility] for further evaluation.  She underwent LTM with no further seizures and was evaluated by neurosurgery and underwent bifrontal craniotomy for meningioma resection on 03/24/2020 and was transferred to the intensive care unit postoperatively with PCCM consulting while she was in ICU. Significant Hospital Events: 6/24 presented to [facility] ED with new onset seizures, CTH with enlarging meningioma with edema, loaded with Keppra 7/3 Underwent bifrontal craniotomy for meningioma 7/6 CTH with new 7 mm thick subdural hematoma at the foramen magnum and tracking adjacent to the basion and odontoid not currently causing critical stenosis of the foramen magnum per radiology read, but merits surveillance. 7/11 recurrent sz like activity. Not correlated on EEG. CT unchanged.  7/13 neurology signed off. 7/21 palliative care was consulted. 7/23 CT chest and abdomen was performed negative for PE or significant pneumonia, without any hemorrhage 7/25 MRI brain without contrast shows increasing mass effect, SDH as well as subacute/acute small scattered infarct. 7/26 tube feeds resumed, neurology reconsulted.  Eliquis resumed per neurosurgery 7/27 oxygenation improving, mentation improving, neurology following.  CTA head and neck without any LVO.  Lower extremity Doppler without any DVT.  Echo shows new systolic dysfunction with global hypokinesis EF 40 to 45% and significant RV  failure. Assessment and Plan: Sphenoidal meningioma, enlarging with new vasogenic edema and new onset seizures Postoperative right inferior lobe edema, subdural hemorrhage along the clivus, SAH and left occipital horn without hydrocephalus Narrowing of the foramen magnum due SDH History is consistent with tonic-clonic seizure with no previous history of seizures Initially presented to [facility]  Admitted under hospitalist service. Transferred to [facility]. Neurosurgery was consulted. Patient S/P bifrontal craniotomy for meningioma resection Patient was started on Keppra and Vimpat. Underwent multiple EEG as well as LTM.  Last EEG 7/28 negative for any acute seizures. MRI shows evidence of SDH and SAH.  Which was present on prior CT as well postoperatively and neurosurgery feels it does not require any therapy or intervention. There is also report for increasing edema, management per neurosurgery. No indication for use of steroids.  Patient was treated with Decadron in the past. Patient is already started on anticoagulation.   Sepsis in setting of HCAP Vs aspiration PNA Acute hypoxic respiratory failure- On 10L of HFNC Febrile on 7/23 on 100.9 BC x2 NGTD, repeat BC X 2 NGTD  SARS COVID-negative UA unremarkable for infection, UC showing multiple species Chest x-ray done and showed "New hazy opacities projecting over both upper lobes in the suprahilar regions could reflect developing infection in the correct clinical setting CT chest, abdomen/pelvis, showed no PE, multifocal groundglass opacity likely pulmonary edema CT abdomen pelvis with no evidence of hemorrhage PCCM rec switching to IV Unasyn Continue IV Lasix Continue DuoNebs, nebulizers Continue supplemental oxygen as needed Blood cultures so far negative.  Due to persistent hypoxia repeat chest x-ray performed on 7/26 shows worsening bilateral opacities concerning for pulmonary edema.   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 Currently appears to have severe exacerbation of heart failure. Repeat echocardiogram on 04/17/2022 shows EF dropping to 40 to 45% with global hypokinesis.  RV function also moderately low. CT PE protocol as well as lower extremity Doppler have ruled out VTE/PE as etiology of RV dysfunction. Currently being treated with IV Lasix. Family okay with the use of IV albumin in case needed for hypotension or to augment diuresis. Continue midodrine support for hypotension Pulmonary following.  Appreciate assistance. We will add chest vest.   Acute embolic CVA-incidental Patient has history of A-fib. MRI brain shows evidence of restricted diffusion and left cerebellum, bilateral occipital lobe, left parietal lobe, bilateral frontal lobe, concerning for small scattered acute and subacute infarct most likely embolic in the setting of A-fib. Anticoagulation is currently resumed. Echocardiogram shows mildly worsening EF.   Patient currently remains n.p.o. per speech therapy evaluation. PT and OT following.  Recommend SNF. Currently on Crestor.  LDL 106 in April 2023.  Currently due to being on continuous tube feeding lipid panel will not be accurate. We will follow neurology recommendation.   Postoperative acute blood loss anemia-expected. Iron deficiency anemia Acute on chronic thrombocytopenia Hemoglobin dropped to 6.5 on 7/22 Transfused 2 units of PRBC on 7/22 Anemia panel iron 15, sats 8, ferritin 440, vitamin B12-->356 Continue monitor for signs and symptoms bleeding; no overt bleeding noted Platelet counts now improving from nadir of 113. Daily CBC, continue iron support.   Hypokalemia Replace as needed  Hypernatremia. In the setting of diuresis. We will increase free water.   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 per tube daily, and Rosuvastatin 20 mg per tube daily Hold carvedilol 6.25 mg per tube twice daily. While the patient's anticoagulation has been resumed, patient will require close observation given her finding of SDH on CT and MRI. Blood pressure is rather soft therefore will continue midodrine.   Type 2 DM controlled with hyperlipidemia and peripheral vascular disease Continue SSI, Accu-Cheks, hypoglycemic protocol Hemoglobin A1c 6.3 in 02/27/2022.   GERD/GI prophylaxis Continue pantoprazole 40 mg per tube daily   Dysphagia On Glucerna 1.5 at 1000 mL per tube at 40 MLS per hour every 24 hours as well as Prosource tube feeding liquid 45 MLS per tube twice daily   Obesity Body mass index is 39.31 kg/m.  Placing the patient at high risk of poor outcome.  She may also have undiagnosed sleep apnea.   GOC discussion Due to multiple comorbidities, minimally responsive state, overall poor prognosis Palliative care consulted for further goals of care discussion   Pressure ulcer left buttock stage II not present on admission. Continue wound care foam dressing and frequent turning.  Subjective: More alert.  Still not interactive.  No nausea no vomiting.  No fever no chills.  Appears to have more purposeful movement.  Physical Exam: Vitals:   04/18/22 0925 04/18/22 1132 04/18/22 1300 04/18/22 1545  BP:  102/66  94/64  Pulse:  94 97 88  Resp:  (!) 23 (!) 23 (!) 21  Temp:  99 F (37.2 C)  98.8 F (37.1 C)  TempSrc:  Axillary  Axillary  SpO2: 93% 95% 93% 93%  Weight:  Height:       General: Appear in mild distress; no visible Abnormal Neck Mass Or lumps, Conjunctiva  normal Cardiovascular: S1 and S2 Present, aortic systolic  Murmur, Respiratory: good respiratory effort, Bilateral Air entry present and bilateral  Crackles, no wheezes Abdomen: Bowel Sound present, Non tender  Extremities: no Pedal edema Neurology: alert and non verbal unable to follow commands, not tracking no unilateral gaze preference seen so far. Gait not checked due to patient safety concerns   Data Reviewed: I have Reviewed nursing notes, Vitals, and Lab results since pt's last encounter. Pertinent lab results CBC and BMP I have ordered test including CBC and BMP I have reviewed the last note from neurology,    Family Communication: Husband at bedside  Author: Berle Mull, MD 04/18/2022 6:06 PM  Please look on [email or web address] to find out who is on call.

## 2022-04-19 DIAGNOSIS — J9601 Acute respiratory failure with hypoxia: Secondary | ICD-10-CM | POA: Diagnosis not present

## 2022-04-19 DIAGNOSIS — T17908D Unspecified foreign body in respiratory tract, part unspecified causing other injury, subsequent encounter: Secondary | ICD-10-CM | POA: Diagnosis not present

## 2022-04-19 LAB — CULTURE, BLOOD (ROUTINE X 2)
Culture: NO GROWTH
Culture: NO GROWTH
Special Requests: ADEQUATE

## 2022-04-19 LAB — CBC WITH DIFFERENTIAL/PLATELET
Abs Immature Granulocytes: 0.41 10*3/uL — ABNORMAL HIGH (ref 0.00–0.07)
Basophils Absolute: 0.1 10*3/uL (ref 0.0–0.1)
Basophils Relative: 1 %
Eosinophils Absolute: 0 10*3/uL (ref 0.0–0.5)
Eosinophils Relative: 1 %
HCT: 32.3 % — ABNORMAL LOW (ref 36.0–46.0)
Hemoglobin: 10.1 g/dL — ABNORMAL LOW (ref 12.0–15.0)
Immature Granulocytes: 5 %
Lymphocytes Relative: 13 %
Lymphs Abs: 1 10*3/uL (ref 0.7–4.0)
MCH: 27.5 pg (ref 26.0–34.0)
MCHC: 31.3 g/dL (ref 30.0–36.0)
MCV: 88 fL (ref 80.0–100.0)
Monocytes Absolute: 0.6 10*3/uL (ref 0.1–1.0)
Monocytes Relative: 8 %
Neutro Abs: 5.7 10*3/uL (ref 1.7–7.7)
Neutrophils Relative %: 72 %
Platelets: 219 10*3/uL (ref 150–400)
RBC: 3.67 MIL/uL — ABNORMAL LOW (ref 3.87–5.11)
RDW: 18.6 % — ABNORMAL HIGH (ref 11.5–15.5)
WBC: 7.8 10*3/uL (ref 4.0–10.5)
nRBC: 0.3 % — ABNORMAL HIGH (ref 0.0–0.2)

## 2022-04-19 LAB — GLUCOSE, CAPILLARY
Glucose-Capillary: 127 mg/dL — ABNORMAL HIGH (ref 70–99)
Glucose-Capillary: 146 mg/dL — ABNORMAL HIGH (ref 70–99)
Glucose-Capillary: 121 mg/dL — ABNORMAL HIGH (ref 70–99)

## 2022-04-19 LAB — BASIC METABOLIC PANEL
Anion gap: 11 (ref 5–15)
Anion gap: 10 (ref 5–15)
BUN: 37 mg/dL — ABNORMAL HIGH (ref 8–23)
BUN: 35 mg/dL — ABNORMAL HIGH (ref 8–23)
CO2: 30 mmol/L (ref 22–32)
CO2: 32 mmol/L (ref 22–32)
Calcium: 8.8 mg/dL — ABNORMAL LOW (ref 8.9–10.3)
Calcium: 8.6 mg/dL — ABNORMAL LOW (ref 8.9–10.3)
Chloride: 107 mmol/L (ref 98–111)
Chloride: 105 mmol/L (ref 98–111)
Creatinine, Ser: 1.18 mg/dL — ABNORMAL HIGH (ref 0.44–1.00)
Creatinine, Ser: 1.07 mg/dL — ABNORMAL HIGH (ref 0.44–1.00)
GFR, Estimated: 48 mL/min — ABNORMAL LOW (ref >60)
GFR, Estimated: 54 mL/min — ABNORMAL LOW (ref >60)
Glucose, Bld: 130 mg/dL — ABNORMAL HIGH (ref 70–99)
Glucose, Bld: 125 mg/dL — ABNORMAL HIGH (ref 70–99)
Potassium: 3.7 mmol/L (ref 3.5–5.1)
Potassium: 3.4 mmol/L — ABNORMAL LOW (ref 3.5–5.1)
Sodium: 148 mmol/L — ABNORMAL HIGH (ref 135–145)
Sodium: 147 mmol/L — ABNORMAL HIGH (ref 135–145)

## 2022-04-19 LAB — MAGNESIUM: Magnesium: 2.2 mg/dL (ref 1.7–2.4)

## 2022-04-19 MED ORDER — IPRATROPIUM-ALBUTEROL 0.5-2.5 (3) MG/3ML IN SOLN: 3.0000 mL | Freq: Four times a day (QID) | RESPIRATORY_TRACT | Status: DC | PRN

## 2022-04-19 MED ORDER — MIDODRINE HCL 5 MG PO TABS
5.0000 mg | ORAL_TABLET | Freq: Three times a day (TID) | ORAL | Status: DC
Start: 2022-04-19 — End: 2022-04-20
  Administered 2022-04-19 – 2022-04-20 (×2): 5 mg
  Filled 2022-04-19 (×2): qty 1

## 2022-04-19 NOTE — Progress Notes (Addendum)
Progress Note for consult Patient: Tara [name] JOA:[id number] DOB: [birth year] DOA: 03/15/2022  DOS: the patient was seen and examined on 04/19/2022 Primary team Ashok Pall, MD   Brief hospital course: 76 year old African-American female PMH of anxiety, depression, type II DM, HLD, PAF, on Eliquis, CKD 3B, breast cancer SP chemoradiation presented to hospital on 6/24 with new onset seizures. Known history of sphenoidal meningioma that was noted to be increased in size on the CT scan with surrounding edema so she is transferred from [facility] for further evaluation.  She underwent LTM with no further seizures and was evaluated by neurosurgery and underwent bifrontal craniotomy for meningioma resection on 03/24/2020 and was transferred to the intensive care unit postoperatively with PCCM consulting while she was in ICU. Significant Hospital Events: 6/24 presented to [facility] ED with new onset seizures, CTH with enlarging meningioma with edema, loaded with Keppra 7/3 Underwent bifrontal craniotomy for meningioma 7/6 CTH with new 7 mm thick subdural hematoma at the foramen magnum and tracking adjacent to the basion and odontoid not currently causing critical stenosis of the foramen magnum per radiology read, but merits surveillance. 7/11 recurrent sz like activity. Not correlated on EEG. CT unchanged.  7/13 neurology signed off. 7/21 palliative care was consulted. 7/23 CT chest and abdomen was performed negative for PE or significant pneumonia, without any hemorrhage 7/25 MRI brain without contrast shows increasing mass effect, SDH as well as subacute/acute small scattered infarct. 7/26 tube feeds resumed, neurology reconsulted.  Eliquis resumed per neurosurgery 7/27 oxygenation improving, mentation improving, neurology following.  CTA head and neck without any LVO.  Lower extremity Doppler without any DVT.  Echo shows new systolic dysfunction with global hypokinesis EF 40 to 45% and significant RV  failure. 7/29 oxygenation improving to 6 L saturating 94%. Assessment and Plan: Sphenoidal meningioma, enlarging with new vasogenic edema and new onset seizures Postoperative right inferior lobe edema, subdural hemorrhage along the clivus, SAH and left occipital horn without hydrocephalus Narrowing of the foramen magnum due SDH History is consistent with tonic-clonic seizure with no previous history of seizures Initially presented to [facility].  Admitted under hospitalist service. Transferred to [facility]. Neurosurgery was consulted. Patient S/P bifrontal craniotomy for meningioma resection Patient was started on Keppra and Vimpat. Underwent multiple EEG as well as LTM.  Last EEG 7/28 negative for any acute seizures. MRI shows evidence of SDH and SAH.  Which was present on prior CT as well postoperatively and neurosurgery feels it does not require any therapy or intervention. There is also report for increasing edema, management per neurosurgery. No indication for use of steroids.  Patient was treated with Decadron in the past. Patient is already started on anticoagulation. No significant improvement in mentation.   Sepsis in setting of HCAP Vs aspiration PNA Acute hypoxic respiratory failure- On 10L of HFNC Febrile on 7/23 on 100.9 BC x2 NGTD, repeat BC X 2 NGTD  SARS COVID-negative UA unremarkable for infection, UC showing multiple species Chest x-ray done and showed "New hazy opacities projecting over both upper lobes in the suprahilar regions could reflect developing infection in the correct clinical setting CT chest, abdomen/pelvis, showed no PE, multifocal groundglass opacity likely pulmonary edema CT abdomen pelvis with no evidence of hemorrhage PCCM rec switching to IV Unasyn Continue IV Lasix Continue DuoNebs, nebulizers Continue supplemental oxygen as needed Blood cultures so far negative.  Due to persistent hypoxia repeat chest x-ray performed on 7/26  shows worsening bilateral opacities concerning for pulmonary edema.  7/28 chest x-ray shows improvement. Now showing improvement in oxygenation on 6 L, saturating 93%.   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 Currently appears to have severe exacerbation of heart failure. Repeat echocardiogram on 04/17/2022 shows EF dropping to 40 to 45% with global hypokinesis.  RV function also moderately low. CT PE protocol as well as lower extremity Doppler have ruled out VTE/PE as etiology of RV dysfunction. Patient was treated with IV Lasix.  Currently on hold as of 7/29 due to hypotension.  And hypernatremia Family okay with the use of IV albumin in case needed for hypotension or to augment diuresis. Continue midodrine support for hypotension, will increase midodrine support as the blood pressure is in 80s. Continue chest vest. Due to hypotension currently holding GDMT.  RV dysfunction. PE ruled out.  Possibly due to pneumonia. Discussed with pulmonary. Patient will require repeat echocardiogram in 6 months.   Acute embolic CVA-incidental Patient has history of A-fib. MRI brain shows evidence of restricted diffusion and left cerebellum, bilateral occipital lobe, left parietal lobe, bilateral frontal lobe, concerning for small scattered acute and subacute infarct most likely embolic in the setting of A-fib. Anticoagulation is currently resumed. Echocardiogram shows mildly worsening EF.   Patient currently remains n.p.o. per speech therapy evaluation. PT and OT following.  Recommend SNF. Currently on Crestor.  LDL 106 in April 2023.  Currently due to being on continuous tube feeding lipid panel will not be accurate. We will follow neurology recommendation.   Postoperative acute blood loss anemia-expected. Iron deficiency anemia Acute on  chronic thrombocytopenia Hemoglobin dropped to 6.5 on 7/22 Transfused 2 units of PRBC on 7/22 Anemia panel iron 15, sats 8, ferritin 440, vitamin B12-->356 Continue monitor for signs and symptoms bleeding; no overt bleeding noted Platelet counts now improving from nadir of 113. Daily CBC, continue iron support.   Hypokalemia Replace as needed  Hypernatremia. In the setting of diuresis. Improving with increasing free water. Currently holding Lasix and anticipating further improvement.  We will recheck.   Anxiety and Depression Continue with sertraline 25 mg per tube daily    HTN-now hypotension Paroxysmal Atrial Fibrillation Hyperlipidemia Echo 6/9 with EF 55% and Grade I diastolic dysfunction; previously had a normal diastolic function Continue Amiodarone 200 mg p.o. per tube daily, and Rosuvastatin 20 mg per tube daily Hold carvedilol 6.25 mg per tube twice daily. While the patient's anticoagulation has been resumed, patient will require close observation given her finding of SDH on CT and MRI. Blood pressure remains soft therefore will continue midodrine.   Type 2 DM controlled with hyperlipidemia and peripheral vascular disease Continue SSI, Accu-Cheks, hypoglycemic protocol Hemoglobin A1c 6.3 in 02/27/2022.   GERD/GI prophylaxis Continue pantoprazole 40 mg per tube daily   Dysphagia Dietitian following speech therapy following.  Currently unable to swallow safely and therefore remains NPO. Currently on tube feeding.  I suspect that the patient will most likely require a G-tube placement.  Corpak placed on 03/28/2022.  Need to be replaced on 04/28/2022.   Obesity Body mass index is 39.31 kg/m.  Placing the patient at high risk of poor outcome.  She may also have undiagnosed sleep apnea.  Weight is mostly inaccurate.   GOC discussion Due to multiple comorbidities, minimally responsive state, overall poor prognosis Palliative care consulted for further goals of care  discussion   Pressure ulcer left buttock stage II not present  on admission. Continue wound care foam dressing and frequent turning.  Subjective: Patient seen twice today.  Remains sleepy.  Spontaneously moving head and upper extremities.  No nausea no vomiting.  No fever.  Physical Exam: Vitals:   04/19/22 0802 04/19/22 0824 04/19/22 1202 04/19/22 1557  BP: 107/68  (!) 90/56 (!) 89/56  Pulse: 89 93 81 82  Resp: 20 (!) '22 19 20  '$ Temp: 98.7 F (37.1 C)  98.7 F (37.1 C) 98.5 F (36.9 C)  TempSrc: Axillary  Axillary Axillary  SpO2: 96% 94% 95% 94%  Weight:      Height:       General: Appear in mild distress; no visible Abnormal Neck Mass Or lumps, Conjunctiva normal Cardiovascular: S1 and S2 Present, no Murmur, Respiratory: increased respiratory effort, Bilateral Air entry present and no Crackles, no wheezes Abdomen: Bowel Sound present, difficult to assess tenderness Extremities: no Pedal edema Neurology: lethargic and non verbal, not able to follow commands Gait not checked due to patient safety concerns  Data Reviewed: I have Reviewed nursing notes, Vitals, and Lab results since pt's last encounter. Pertinent lab results CBC and BMP I have ordered test including CBC and BMP I have discussed pt's care plan and test results with pulmonary.    Family Communication: Sister is at bedside.  Author: Berle Mull, MD 04/19/2022 6:47 PM  Please look on [email or web address] to find out who is on call.

## 2022-04-19 NOTE — Progress Notes (Signed)
Patient ID: Tara [name], female   DOB: 15-Aug-1946, 76 y.o.   MRN: [id number] BP 107/68 (BP Location: Left Wrist)   Pulse 93   Temp 98.7 F (37.1 C) (Axillary)   Resp (!) 22   Ht '4\' 9"'$  (1.448 m)   Wt 82.4 kg   SpO2 94%   BMI 39.31 kg/m  Not speaking, not following commands Moving all extremities Wound is clean, dry No real neurological change

## 2022-04-20 ENCOUNTER — Inpatient Hospital Stay (HOSPITAL_COMMUNITY): Payer: Medicare Other

## 2022-04-20 DIAGNOSIS — Z7189 Other specified counseling: Secondary | ICD-10-CM | POA: Diagnosis not present

## 2022-04-20 DIAGNOSIS — R569 Unspecified convulsions: Secondary | ICD-10-CM | POA: Diagnosis not present

## 2022-04-20 DIAGNOSIS — Z515 Encounter for palliative care: Secondary | ICD-10-CM | POA: Diagnosis not present

## 2022-04-20 LAB — CBC WITH DIFFERENTIAL/PLATELET
Abs Immature Granulocytes: 0.42 10*3/uL — ABNORMAL HIGH (ref 0.00–0.07)
Basophils Absolute: 0 10*3/uL (ref 0.0–0.1)
Basophils Relative: 1 %
Eosinophils Absolute: 0.1 10*3/uL (ref 0.0–0.5)
Eosinophils Relative: 1 %
HCT: 29.6 % — ABNORMAL LOW (ref 36.0–46.0)
Hemoglobin: 9.3 g/dL — ABNORMAL LOW (ref 12.0–15.0)
Immature Granulocytes: 5 %
Lymphocytes Relative: 13 %
Lymphs Abs: 1.2 10*3/uL (ref 0.7–4.0)
MCH: 27.7 pg (ref 26.0–34.0)
MCHC: 31.4 g/dL (ref 30.0–36.0)
MCV: 88.1 fL (ref 80.0–100.0)
Monocytes Absolute: 0.6 10*3/uL (ref 0.1–1.0)
Monocytes Relative: 7 %
Neutro Abs: 6.3 10*3/uL (ref 1.7–7.7)
Neutrophils Relative %: 73 %
Platelets: 217 10*3/uL (ref 150–400)
RBC: 3.36 MIL/uL — ABNORMAL LOW (ref 3.87–5.11)
RDW: 18.2 % — ABNORMAL HIGH (ref 11.5–15.5)
WBC: 8.6 10*3/uL (ref 4.0–10.5)
nRBC: 0 % (ref 0.0–0.2)

## 2022-04-20 LAB — BASIC METABOLIC PANEL
Anion gap: 8 (ref 5–15)
BUN: 35 mg/dL — ABNORMAL HIGH (ref 8–23)
CO2: 29 mmol/L (ref 22–32)
Calcium: 8.7 mg/dL — ABNORMAL LOW (ref 8.9–10.3)
Chloride: 109 mmol/L (ref 98–111)
Creatinine, Ser: 0.99 mg/dL (ref 0.44–1.00)
GFR, Estimated: 59 mL/min — ABNORMAL LOW (ref >60)
Glucose, Bld: 146 mg/dL — ABNORMAL HIGH (ref 70–99)
Potassium: 3.3 mmol/L — ABNORMAL LOW (ref 3.5–5.1)
Sodium: 146 mmol/L — ABNORMAL HIGH (ref 135–145)

## 2022-04-20 LAB — D-DIMER, QUANTITATIVE: D-Dimer, Quant: 1.3 ug/mL-FEU — ABNORMAL HIGH (ref 0.00–0.50)

## 2022-04-20 LAB — T4, FREE: Free T4: 0.95 ng/dL (ref 0.61–1.12)

## 2022-04-20 LAB — GLUCOSE, CAPILLARY
Glucose-Capillary: 132 mg/dL — ABNORMAL HIGH (ref 70–99)
Glucose-Capillary: 139 mg/dL — ABNORMAL HIGH (ref 70–99)
Glucose-Capillary: 141 mg/dL — ABNORMAL HIGH (ref 70–99)
Glucose-Capillary: 99 mg/dL (ref 70–99)

## 2022-04-20 LAB — TSH: TSH: 3.648 u[IU]/mL (ref 0.350–4.500)

## 2022-04-20 LAB — BRAIN NATRIURETIC PEPTIDE: B Natriuretic Peptide: 221.3 pg/mL — ABNORMAL HIGH (ref 0.0–100.0)

## 2022-04-20 MED ORDER — MIDODRINE HCL 5 MG PO TABS
2.5000 mg | ORAL_TABLET | Freq: Two times a day (BID) | ORAL | Status: DC
Start: 2022-04-20 — End: 2022-04-24
  Administered 2022-04-20 – 2022-04-23 (×7): 2.5 mg
  Filled 2022-04-20 (×7): qty 1

## 2022-04-20 MED ORDER — POTASSIUM CHLORIDE 20 MEQ PO PACK: 40.0000 meq | PACK | Freq: Once | ORAL | Status: DC

## 2022-04-20 MED ORDER — FUROSEMIDE 10 MG/ML IJ SOLN
20.0000 mg | Freq: Once | INTRAMUSCULAR | Status: AC
  Administered 2022-04-20: 20 mg via INTRAVENOUS
  Filled 2022-04-20: qty 2

## 2022-04-20 MED ORDER — PANTOPRAZOLE 2 MG/ML SUSPENSION
40.0000 mg | Freq: Two times a day (BID) | ORAL | Status: DC
Start: 2022-04-20 — End: 2022-04-20

## 2022-04-20 MED ORDER — PANTOPRAZOLE 2 MG/ML SUSPENSION
40.0000 mg | Freq: Two times a day (BID) | ORAL | Status: DC
  Administered 2022-04-20 – 2022-05-05 (×31): 40 mg
  Filled 2022-04-20 (×31): qty 20

## 2022-04-20 MED ORDER — PANTOPRAZOLE SODIUM 40 MG IV SOLR: 40.0000 mg | Freq: Two times a day (BID) | INTRAVENOUS | Status: DC

## 2022-04-20 MED ORDER — POTASSIUM CHLORIDE 10 MEQ/100ML IV SOLN
10.0000 meq | INTRAVENOUS | Status: DC
  Filled 2022-04-20 (×2): qty 100

## 2022-04-20 MED ORDER — POTASSIUM CHLORIDE 20 MEQ PO PACK
40.0000 meq | PACK | Freq: Once | ORAL | Status: AC
  Administered 2022-04-20: 40 meq
  Filled 2022-04-20: qty 2

## 2022-04-20 MED ORDER — SODIUM CHLORIDE 0.9 % IV SOLN
3.0000 g | Freq: Three times a day (TID) | INTRAVENOUS | Status: AC
  Administered 2022-04-20 – 2022-04-24 (×13): 3 g via INTRAVENOUS
  Filled 2022-04-20 (×13): qty 8

## 2022-04-20 MED ORDER — POTASSIUM CHLORIDE 20 MEQ PO PACK: 20.0000 meq | PACK | Freq: Once | ORAL | Status: DC

## 2022-04-20 NOTE — Progress Notes (Signed)
Patient ID: Tara [name], female   DOB: 1945-10-12, 76 y.o.   MRN: [id number] BP 123/73 (BP Location: Left Wrist)   Pulse 96   Temp 98 F (36.7 C) (Axillary)   Resp 20   Ht '4\' 9"'$  (1.448 m)   Wt 82.4 kg   SpO2 95%   BMI 39.31 kg/m  Not following commands Not speaking Moving all extremities Localizing Exam unchanged. Would certainly give at least two weeks if not more to see if she improves.

## 2022-04-20 NOTE — Progress Notes (Signed)
Palliative Medicine Inpatient Follow Up Note  HPI: Patient is a 76 year old African-American female currently with past medical history significant for but not limited to anxiety, depression, diabetes mellitus type 2, hypertension, hyperlipidemia, cardiomyopathy, history of atrial fibrillation on anticoagulation with Eliquis, chronic kidney disease stage IIIb, history of breast cancer status post chemoradiation in 2008 as well as other comorbidities who presented to the hospital on 03/15/2022 with new onset seizures.  She had a known sphenoidal meningioma that was noted to be increased in size on the CT scan with surrounding edema so she is transferred from [facility] for further evaluation.  She underwent LTM with no further seizures and was evaluated by neurosurgery and underwent bifrontal craniotomy for meningioma resection on 03/24/2020. She has been minimally responsive since.   Has had a prolonged 36 day hospitalization.   Palliative care has been asked to get involved to discuss goals of care.   Today's Discussion 04/20/2022  *Please note that this is a verbal dictation therefore any spelling or grammatical errors are due to the "[employer] One" system interpretation.  Chart reviewed inclusive of vital signs, progress notes, laboratory results, and diagnostic images.   I met with Rylie's husband Jeneen Rinks at bedside this morning.  Jeneen Rinks shares that the plan will be to continue waiting in the oncoming week to see if Nuvia makes additional improvements.  He expresses that she has had worsening lip quivering throughout the night which had prior been a sign of seizures.  I shared that I would pass this along to the medical team.  In terms of Bevan's resuscitation status he shares that he has not discussed this with his family and his continuing to consider this.  Created space and opportunity for patient to explore thoughts feelings and fears regarding current medical situation.  One of the  big concerns of Jeneen Rinks is the lack of nursing support whereby he feels Makylah is not gotten out of the bed daily and that there is skeletal staff not just on the weekends but all the time which is frustrating to him.  Offered support through empathetic listening.  I called patient's daughter [name] this morning, we reviewed Akaila's clinical state.  [name] shares she has not seen much of an improvement since her surgical procedure.  She understands that it may take time.  She shares that in terms of things like resuscitation she does know her mother's wishes and that she would not want to live in a prolonged state dependent upon artificial measures of support.  Given that her mother had made the decision to be full code full scope of care when she was admitted to the hospital [name] wants to respect that.  For the time being the plan is to continue watchful waiting for outcomes.  We reviewed best case and worst-case scenarios.  I asked [name] if Paolina did not improve to the point of recovery and something like hospice was needed would that be desired.  [name] shares that she is thought about this and the main goal is to get [place] home and if it was through hospice then she and her family would have to accept that.  We discussed that at this time its not clear what the outcome will be though it is important to have all of the options known.  Questions and concerns addressed/Palliative Support Provided.   Objective Assessment: Vital Signs Vitals:   04/20/22 0800 04/20/22 0843  BP: 123/73   Pulse: 96   Resp: 20  Temp: 98 F (36.7 C)   SpO2: 96% 95%    Intake/Output Summary (Last 24 hours) at 04/20/2022 1053 Last data filed at 04/20/2022 0530 Gross per 24 hour  Intake --  Output 950 ml  Net -950 ml   Last Weight  Most recent update: 04/14/2022  6:31 AM    Weight  82.4 kg (181 lb 10.5 oz)            Gen: Frail elderly African-American female HEENT: Suturing across forehead, core  track in place, dry mucous membranes CV: Regular rate and rhythm, no murmurs rubs or gallops PULM: On 5LPM [place], breathing even and nonlabored ABD: soft/nontender EXT: Generalized edema Neuro: Eyes closed does not verbally respond nor follow any commands  SUMMARY OF RECOMMENDATIONS   Full Code/Full Scope of Care Allow time for outcomes Family hears from Dr. Christella Noa that he believes she can still make a full recovery  GOC is to restore Simonne to a state that she can return home, if not - she would not wish to die in a hospital and family would accept hospice in the home if it came to this PMT will continue to follow and support family  Time Spent: 73  Billing based on MDM: High  Problems Addressed: One acute or chronic illness or injury that poses a threat to life or bodily function  Amount and/or Complexity of Data: Category 3:Discussion of management or test interpretation with external physician/other qualified health care professional/appropriate source (not separately reported)  Risks: Decision regarding hospitalization or escalation of hospital care ______________________________________________________________________________________ [place] Team Team Cell Phone: [phone] Please utilize secure chat with additional questions, if there is no response within 30 minutes please call the above phone number  Palliative Medicine Team providers are available by phone from 7am to 7pm daily and can be reached through the team cell phone.  Should this patient require assistance outside of these hours, please call the patient's attending physician.

## 2022-04-20 NOTE — Progress Notes (Addendum)
Progress Note for consult  Patient: Tara [name] DPO:[id number] DOB: December 08, 1945 DOA: 03/15/2022  DOS: the patient was seen and examined on 04/20/2022 Primary Team Ashok Pall, MD   Brief hospital course: 76 year old African-American female PMH of anxiety, depression, type II DM, HLD, PAF, on Eliquis, CKD 3B, breast cancer SP chemoradiation presented to hospital on 6/24 with new onset seizures. Known history of sphenoidal meningioma that was noted to be increased in size on the CT scan with surrounding edema so she is transferred from [facility] for further evaluation.  She underwent LTM with no further seizures and was evaluated by neurosurgery and underwent bifrontal craniotomy for meningioma resection on 03/24/2020 and was transferred to the intensive care unit postoperatively with PCCM consulting while she was in ICU. Significant Hospital Events: 6/24 presented to [facility] ED with new onset seizures, CTH with enlarging meningioma with edema, loaded with Keppra 7/3 Underwent bifrontal craniotomy for meningioma 7/6 CTH with new 7 mm thick subdural hematoma at the foramen magnum and tracking adjacent to the basion and odontoid not currently causing critical stenosis of the foramen magnum per radiology read, but merits surveillance. 7/11 recurrent sz like activity. Not correlated on EEG. CT unchanged.  7/13 neurology signed off. 7/21 palliative care was consulted. 7/23 CT chest and abdomen was performed negative for PE or significant pneumonia, without any hemorrhage 7/25 MRI brain without contrast shows increasing mass effect, SDH as well as subacute/acute small scattered infarct. 7/26 tube feeds resumed, neurology reconsulted.  Eliquis resumed per neurosurgery 7/27 oxygenation improving, mentation improving, neurology following.  CTA head and neck without any LVO.  Lower extremity Doppler without any DVT.  Echo shows new systolic dysfunction with global hypokinesis EF 40 to 45% and significant RV  failure. 7/29 oxygenation improving to 6 L saturating 94%. 7/30 had another aspiration event. Tube feeds on hold. Now on NRB. Remains full code. No tracheostomy per husband. Per Neurosurgery, give at least two weeks if not more to see if she improves.   Assessment and Plan: Sphenoidal meningioma, vasogenic edema and new onset seizures Postoperative right inferior lobe edema, subdural hemorrhage along the clivus, SAH and left occipital horn without hydrocephalus Narrowing of the foramen magnum due SDH Tonic-clonic seizure with no previous history of seizures Initially presented to [facility], under hospitalist service and transferred to [facility]. Neurosurgery was consulted. S/P bifrontal craniotomy for meningioma resection on 03/24/2022 Patient was started on Keppra and Vimpat. Underwent multiple EEG as well as LTM.  Last EEG 7/28 negative for any acute seizures. MRI 7/25 shows evidence of SDH and SAH.  Which was present on prior CT as well postoperatively and neurosurgery feels it does not require any therapy or intervention. There is also report for increasing edema. Management per neurosurgery. Per my discussion No indication for use of steroids for now. Anticoagulation with Apixaban resumed on 7/28 No significant improvement in mentation as of 7/30. Per Neurosurgery allow 2 weeks to assess improvement.    Sepsis in setting of HCAP and Recurrent Aspiration pneumonia  Acute hypoxic respiratory failure Febrile on 7/23 on 100.9 BC x2 NGTD, repeat BC X 2 NGTD  SARS COVID-negative UA unremarkable for infection, UC growing multiple species Chest x-ray showed "New hazy opacities projecting over both upper lobes in the suprahilar regions could reflect developing infection in the correct clinical setting CT chest, abdomen/pelvis, showed no PE, multifocal groundglass opacity likely pulmonary edema. CT abdomen pelvis with no evidence of hemorrhage PCCM was consulted and pt was  switched to IV Unasyn. Continue DuoNebs, nebulizers Continue supplemental oxygen as needed Pulmonary toilet with Chest Vest and NT suction. Briefly had improvement in oxygenation on 6 L, saturating 93%. On 7/30 worsening hypoxia and needed NRB.  Continue PRN IV lasix.   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 Currently appears to have severe exacerbation of heart failure. Repeat echocardiogram on 04/17/2022 shows EF dropping to 40 to 45% with global hypokinesis.  RV function also moderately low. CT PE protocol as well as lower extremity Doppler have ruled out VTE/PE Continue with IV Lasix as needed. Family okay with the use of IV albumin in case needed for hypotension or to augment diuresis. Continue midodrine support for hypotension Continue chest vest. Due to hypotension currently unable to initiate GDMT.  RV dysfunction. PE ruled out.  Possibly due to pneumonia. Discussed with pulmonary. Patient will require repeat echocardiogram in 6 months.   Acute embolic CVA-incidental Patient has history of A-fib. MRI brain shows evidence of restricted diffusion and left cerebellum, bilateral occipital lobe, left parietal lobe, bilateral frontal lobe, concerning for small scattered acute and subacute infarct most likely embolic in the setting of A-fib. Anticoagulation is currently resumed. Echocardiogram shows mildly worsening EF.   Patient currently remains n.p.o. per speech therapy evaluation. PT and OT following.  Recommend SNF. Currently on Crestor.  LDL 106 in April 2023.  Currently due to being on continuous tube feeding lipid panel will not be accurate. We will follow neurology recommendation.   Postoperative acute blood loss anemia-expected. Iron deficiency anemia Acute on chronic thrombocytopenia Hemoglobin dropped to 6.5  on 7/22 Transfused 2 units of PRBC on 7/22 Anemia panel iron 15, sats 8, ferritin 440, vitamin B12-->356 Continue monitor for signs and symptoms bleeding; no overt bleeding noted Platelet counts now improving from nadir of 113. Continue iron support.  Dysphagia Recurrent aspiration Dietitian following speech therapy following. Currently unable to swallow safely and therefore remains NPO. Currently on tube feeding. I suspect that the patient will most likely require a GJ-tube placement. Cortrak placed on 03/28/2022. Need to be replaced on 04/28/2022.  [place] discussion Due to multiple comorbidities, minimally responsive state, overall poor prognosis Palliative care consulted for further goals of care discussion.  As of 7/30 per neurosurgery, "Would certainly give at least two weeks if not more to see if she improves." Discussed with husband and daughter on 7/30. Husband does not want the patient to go to tracheostomy.  Hypokalemia Replace as needed  Hypernatremia. In the setting of diuresis. Improving with increasing free water.   Anxiety and Depression Continue with sertraline 25 mg per tube daily    HTN-now hypotension Paroxysmal Atrial Fibrillation Hyperlipidemia Echo 6/9 with EF 55% and Grade I diastolic dysfunction; previously had a normal diastolic function Continue Amiodarone 200 mg p.o. per tube daily, and Rosuvastatin 20 mg per tube daily Hold carvedilol 6.25 mg per tube twice daily. While the patient's anticoagulation has been resumed, patient will require close observation given her finding of SDH on CT and MRI. Blood pressure remains soft therefore will continue midodrine.   Type 2 DM controlled with hyperlipidemia and peripheral vascular disease Continue SSI, Accu-Cheks, hypoglycemic protocol Hemoglobin A1c 6.3 in 02/27/2022. Hold the long-acting insulin as the patient will be not on tube feed anymore.   GERD/GI prophylaxis Continue pantoprazole 40 mg per tube daily    Obesity Body mass index is 39.31  kg/m.  Placing the patient at high risk of poor outcome.  She may also have undiagnosed sleep apnea.   Pressure ulcer left buttock stage II not present on admission. Continue wound care foam dressing and frequent turning.  Limited access. Patient has poor venous access.  May require PICC line placement.  Discussed with daughter and have received consent.  Subjective: Continues to remain unresponsive and, unable to follow any commands and nonverbal.  Intermittent lipsmacking behavior is still seen.  Hyperventilating for last 2 days. Later in the day had an episode of diaphoresis followed by increased shortness of breath and gagging feeling per husband requiring nonrebreather.  Physical Exam: Vitals:   04/20/22 1506 04/20/22 1550 04/20/22 1618 04/20/22 1717  BP: 138/83  106/63 109/70  Pulse: (!) 107 87 86 (!) 101  Resp: (!) 28 19 (!) 22 17  Temp:   (P) 98 F (36.7 C) 98 F (36.7 C)  TempSrc:   (P) Axillary Axillary  SpO2: 93% 99% 91% 96%  Weight:      Height:       General: Appear in moderate distress, no Rash; Oral Mucosa Clear, moist. no Abnormal Neck Mass Or lumps, Conjunctiva normal  Cardiovascular: S1 and S2 Present, no Murmur, Respiratory: increased respiratory effort, Bilateral Air entry present and bilateral Crackles, no wheezes Abdomen: Bowel Sound present, Soft. Difficult to assess tenderness Extremities: trace Pedal edema Neurology: Nonverbal, drowsy, unable to follow any commands, noncommunicating.    Data Reviewed: I have Reviewed nursing notes, Vitals, and Lab results since pt's last encounter. Pertinent lab results CBC and BMP I have ordered test including CBC BMP and magnesium I have ordered imaging studies chest x-ray. I have independently visualized and interpreted imaging x-ray abdomen which showed no evidence of significant constipation. I have discussed pt's care plan and test results with neurosurgery and palliative care  and PCCM.     Family Communication: Husband at the bedside.  Also discussed with daughter  The patient is critically ill with multiple organ systems failure and requires high complexity decision making for assessment and support, frequent evaluation and titration of therapies. Critical Care Time devoted to patient care services described in this note is 35 minutes   Author: Berle Mull, MD 04/20/2022 7:05 PM  Please look on [email or web address] to find out who is on call.

## 2022-04-20 NOTE — Progress Notes (Signed)
Speech Language Pathology Treatment: Dysphagia  Patient Details Name: Tara [name] MRN: [id number] DOB: 16-Nov-1945 Today's Date: 04/20/2022 Time: 1040-1050 SLP Time Calculation (min) (ACUTE ONLY): 10 min  Assessment / Plan / Recommendation Clinical Impression  Tara [name] was able to be roused for limited participation. Her husband was at bedside.  She opened eyes, accepted small sips of water with persistent lip smacking that has been described in previous notes. She did trigger an eventual swallow response, followed by immediate coughing. She is not able to take any POs safely and is not showing improvements in swallowing, unfortunately. Spoke with Tara [name] about her dysphagia and inability to eat. She has had cortrak since 7/7.  If consistent with GOC, it is likely time to consider PEG. Mr. Munley asked some questions about PEG and I encouraged him to gather more information from Tara [name]'s providers. Shared this recommendation with Tara [name] from Palliative Care.   SLP will continue to follow.    HPI HPI: Pt is a 76 y/o who presented 6/24 with new onset seizures. 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 EEG negative for seizures. Neurosurgey consulted and did not recommend meningioma resection as of 6/27. Decline in swallow function noted overnight 6/27. Dx acute toxic metabolic encephalopathy suspected secondary to keppra. Pt s/p Bifrontal craniotomy for resection of meningioma 7/3.  Cortrak 7/7. 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 PMH: HTN, HLD, GERD, CKD stage IIIb, chronic HFpEF, meningioma, breast CA-s/p right lumpectomy and chemoradiation in 2008, depression/anxiety. BSE 03/11/22: regular texture diet and thin liquids without need for follow up.      SLP Plan  Continue with current plan of care      Recommendations for follow up therapy are one component of a multi-disciplinary discharge planning process, led by the attending physician.   Recommendations may be updated based on patient status, additional functional criteria and insurance authorization.    Recommendations  Diet recommendations: NPO                Oral Care Recommendations: Oral care QID Follow Up Recommendations: Skilled nursing-short term rehab (<3 hours/day) Assistance recommended at discharge: Frequent or constant Supervision/Assistance SLP Visit Diagnosis: Dysphagia, oropharyngeal phase (R13.12) Plan: Continue with current plan of care         Tara Corcoran L. Tivis Ringer, MA CCC/SLP Clinical Specialist - Acute Care SLP Acute Rehabilitation Services Office number [phone]   Tara [name]  04/20/2022, 11:24 AM

## 2022-04-20 NOTE — Progress Notes (Signed)
Patient having respiratory distress. O2 80% on HFNC. Non-rebreather applied. Rapid response and RT called. NP suctioned. O2 back up to 96%. MD notified. MD came to bedside. Discussed situation with patients family. Will continue to monitor.  Era Bumpers, RN

## 2022-04-20 NOTE — Progress Notes (Addendum)
Patient having labored breathing. O2 sat 96% on 6L HFNC. Notified MD. MD to bedside. Ordered chest xray  Era Bumpers, RN

## 2022-04-21 DIAGNOSIS — T17908D Unspecified foreign body in respiratory tract, part unspecified causing other injury, subsequent encounter: Secondary | ICD-10-CM | POA: Diagnosis not present

## 2022-04-21 DIAGNOSIS — R569 Unspecified convulsions: Secondary | ICD-10-CM | POA: Diagnosis not present

## 2022-04-21 DIAGNOSIS — J9601 Acute respiratory failure with hypoxia: Secondary | ICD-10-CM | POA: Diagnosis not present

## 2022-04-21 LAB — CBC WITH DIFFERENTIAL/PLATELET
Abs Immature Granulocytes: 0.34 10*3/uL — ABNORMAL HIGH (ref 0.00–0.07)
Basophils Absolute: 0.1 10*3/uL (ref 0.0–0.1)
Basophils Relative: 1 %
Eosinophils Absolute: 0.1 10*3/uL (ref 0.0–0.5)
Eosinophils Relative: 1 %
HCT: 31 % — ABNORMAL LOW (ref 36.0–46.0)
Hemoglobin: 9.7 g/dL — ABNORMAL LOW (ref 12.0–15.0)
Immature Granulocytes: 3 %
Lymphocytes Relative: 10 %
Lymphs Abs: 1.2 10*3/uL (ref 0.7–4.0)
MCH: 27.4 pg (ref 26.0–34.0)
MCHC: 31.3 g/dL (ref 30.0–36.0)
MCV: 87.6 fL (ref 80.0–100.0)
Monocytes Absolute: 0.8 10*3/uL (ref 0.1–1.0)
Monocytes Relative: 7 %
Neutro Abs: 10 10*3/uL — ABNORMAL HIGH (ref 1.7–7.7)
Neutrophils Relative %: 78 %
Platelets: 236 10*3/uL (ref 150–400)
RBC: 3.54 MIL/uL — ABNORMAL LOW (ref 3.87–5.11)
RDW: 18 % — ABNORMAL HIGH (ref 11.5–15.5)
WBC: 12.5 10*3/uL — ABNORMAL HIGH (ref 4.0–10.5)
nRBC: 0 % (ref 0.0–0.2)

## 2022-04-21 LAB — BASIC METABOLIC PANEL
Anion gap: 10 (ref 5–15)
BUN: 35 mg/dL — ABNORMAL HIGH (ref 8–23)
CO2: 31 mmol/L (ref 22–32)
Calcium: 8.9 mg/dL (ref 8.9–10.3)
Chloride: 106 mmol/L (ref 98–111)
Creatinine, Ser: 1.03 mg/dL — ABNORMAL HIGH (ref 0.44–1.00)
GFR, Estimated: 56 mL/min — ABNORMAL LOW (ref >60)
Glucose, Bld: 95 mg/dL (ref 70–99)
Potassium: 3.5 mmol/L (ref 3.5–5.1)
Sodium: 147 mmol/L — ABNORMAL HIGH (ref 135–145)

## 2022-04-21 LAB — MAGNESIUM: Magnesium: 2.1 mg/dL (ref 1.7–2.4)

## 2022-04-21 LAB — GLUCOSE, CAPILLARY
Glucose-Capillary: 107 mg/dL — ABNORMAL HIGH (ref 70–99)
Glucose-Capillary: 132 mg/dL — ABNORMAL HIGH (ref 70–99)
Glucose-Capillary: 88 mg/dL (ref 70–99)
Glucose-Capillary: 90 mg/dL (ref 70–99)

## 2022-04-21 MED ORDER — METOCLOPRAMIDE HCL 5 MG/5ML PO SOLN
5.0000 mg | Freq: Three times a day (TID) | ORAL | Status: DC
  Administered 2022-04-22: 5 mg
  Filled 2022-04-21 (×2): qty 10

## 2022-04-21 MED ORDER — FUROSEMIDE 10 MG/ML IJ SOLN
20.0000 mg | Freq: Once | INTRAMUSCULAR | Status: AC
  Administered 2022-04-21: 20 mg via INTRAVENOUS
  Filled 2022-04-21: qty 2

## 2022-04-21 NOTE — Progress Notes (Signed)
Patient ID: Tara [name], female   DOB: 03-19-1946, 76 y.o.   MRN: [id number] BP 95/60 (BP Location: Left Wrist)   Pulse 89   Temp 98.4 F (36.9 C) (Oral)   Resp (!) 23   Ht '4\' 9"'$  (1.448 m)   Wt 82.4 kg   SpO2 98%   BMI 39.31 kg/m  Currently sleeping. Was sitting on side of bed earlier and spoke a few words.  She needs the stimulation Wound is clean, and dry Moving all extremities.  Final pathology is meningioma grade l

## 2022-04-21 NOTE — Progress Notes (Signed)
Physical Therapy Treatment Patient Details Name: Tara [name] MRN: [id number] DOB: 17-May-1946 Today's Date: 04/21/2022   History of Present Illness 76 yo woman admitted 03/15/22 with new onset seizures related to meningioma. s/p tumor resection 7/3. Recent DC from [facility] 6/22. MRI on 7/26 shows: SDH extending inferiorly along the clivus and  narrowing the foramen magnum with possible mass effect. PMH: CKD stage 3, HFpEF, a fib, meningioma.    PT Comments    The pt was in bed with eyes closed upon my arrival, family present and encouraged continued attempts at mobility. Pt with no active command following at this time, but tolerated ROM of all extremities and changes in position without change in vitals or grimacing. Once sitting EOB, pt was able to maintain eyes open with max stimulation from family and maxA to maintain static sitting from therapist. Will continue to benefit from maximal OOB attempts via lift to attempt to increase alertness and reduce risk of pressure sores.     Recommendations for follow up therapy are one component of a multi-disciplinary discharge planning process, led by the attending physician.  Recommendations may be updated based on patient status, additional functional criteria and insurance authorization.  Follow Up Recommendations  Skilled nursing-short term rehab (<3 hours/day) Can patient physically be transported by private vehicle: No   Assistance Recommended at Discharge Frequent or constant Supervision/Assistance  Patient can return home with the following Assistance with cooking/housework;Assist for transportation;Direct supervision/assist for financial management;Two people to help with walking and/or transfers;Two people to help with bathing/dressing/bathroom;Help with stairs or ramp for entrance   Equipment Recommendations  Wheelchair (measurements PT);Wheelchair cushion (measurements PT);Hospital bed;Other (comment)    Recommendations for Other Services        Precautions / Restrictions Precautions Precautions: Fall Precaution Comments: seizures, multiple lines Restrictions Weight Bearing Restrictions: No     Mobility  Bed Mobility Overal bed mobility: Needs Assistance Bed Mobility: Rolling, Sidelying to Sit, Sit to Sidelying Rolling: Total assist Sidelying to sit: Total assist     Sit to sidelying: Independent General bed mobility comments: totalA to complete rolling in bed and transition to sidelying, pt with no ability to correct posterior LOB, mod-maxA to maintain static sitting    Transfers                   General transfer comment: deferred to focus on bed mobility and attempt to improve alertness        Modified Rankin (Stroke Patients Only) Modified Rankin (Stroke Patients Only) Pre-Morbid Rankin Score: Moderately severe disability Modified Rankin: Severe disability     Balance Overall balance assessment: Needs assistance Sitting-balance support: Feet supported Sitting balance-Leahy Scale: Poor Sitting balance - Comments: dependent on posterior support at this time, unable to maintain upright position without support Postural control: Posterior lean, Left lateral lean                                  Cognition Arousal/Alertness: Lethargic Behavior During Therapy: Flat affect Overall Cognitive Status: Impaired/Different from baseline Area of Impairment: Orientation, Attention, Memory, Following commands, Safety/judgement, Problem solving, Awareness                 Orientation Level:  (pt is mute and not following commands) Current Attention Level: Focused Memory: Decreased recall of precautions, Decreased short-term memory Following Commands: Follows one step commands inconsistently, Follows one step commands with increased time Safety/Judgement: Decreased awareness of deficits,  Decreased awareness of safety Awareness: Intellectual Problem Solving: Slow processing, Decreased  initiation, Difficulty sequencing, Requires verbal cues, Requires tactile cues General Comments: pt with no noted command following or tracking of verbal or tactile stimuli noted during session, pt did open eyes after transition to sitting EOB, max stimulation        Exercises General Exercises - Upper Extremity Shoulder Flexion: PROM, Both, Seated, 10 reps Shoulder Extension: PROM, Right, Sidelying, 10 reps Elbow Flexion: PROM, Both, 10 reps, Seated Elbow Extension: PROM, Both, 10 reps, Seated Wrist Flexion: PROM, Both, 5 reps, Seated Wrist Extension: PROM, Both, 5 reps, Seated Digit Composite Flexion: PROM, Both, 5 reps, Seated Composite Extension: PROM, Both, 5 reps, Seated General Exercises - Lower Extremity Ankle Circles/Pumps: PROM, Both, 15 reps, Supine Heel Slides: PROM, Both, 10 reps, Supine Hip ABduction/ADduction: PROM, Both, 10 reps, Supine Hip Flexion/Marching: PROM, Both, 5 reps, Seated Other Exercises Other Exercises: PROM to neck into bilateral rotation and lateral flexion. less movement to L in both movements. minimally past midline    General Comments General comments (skin integrity, edema, etc.): VSS on 7L HFNC. (94-98%). HR stable in 80s, no change in BP      Pertinent Vitals/Pain Pain Assessment Pain Assessment: Faces Faces Pain Scale: No hurt Pain Location: no grimacing with PROM Pain Intervention(s): Monitored during session     PT Goals (current goals can now be found in the care plan section) Acute Rehab PT Goals Patient Stated Goal: per family, to return to independence, plan to wait 2 weeks to see improvement PT Goal Formulation: With family Time For Goal Achievement: 04/30/22 Potential to Achieve Goals: Fair Progress towards PT goals: Progressing toward goals    Frequency    Min 2X/week      PT Plan Current plan remains appropriate       AM-PAC PT "6 Clicks" Mobility   Outcome Measure  Help needed turning from your back to your  side while in a flat bed without using bedrails?: Total Help needed moving from lying on your back to sitting on the side of a flat bed without using bedrails?: Total Help needed moving to and from a bed to a chair (including a wheelchair)?: Total Help needed standing up from a chair using your arms (e.g., wheelchair or bedside chair)?: Total Help needed to walk in hospital room?: Total Help needed climbing 3-5 steps with a railing? : Total 6 Click Score: 6    End of Session Equipment Utilized During Treatment: Oxygen Activity Tolerance: Patient tolerated treatment well Patient left: in bed;with family/visitor present;with bed alarm set Nurse Communication: Mobility status;Need for lift equipment PT Visit Diagnosis: Other abnormalities of gait and mobility (R26.89);Other symptoms and signs involving the nervous system (R29.898)     Time: 3559-7416 PT Time Calculation (min) (ACUTE ONLY): 42 min  Charges:  $Therapeutic Exercise: 23-37 mins $Therapeutic Activity: 8-22 mins                     West Carbo, PT, DPT   Acute Rehabilitation Department   Sandra Cockayne 04/21/2022, 11:05 AM

## 2022-04-21 NOTE — Progress Notes (Signed)
Progress Note for consult  Patient: Tara [name] BJS:[id number] DOB: Mar 26, 1946 DOA: 03/15/2022  DOS: the patient was seen and examined on 04/21/2022 Primary Team Ashok Pall, MD   Brief hospital course: 76 year old African-American female PMH of anxiety, depression, type II DM, HLD, PAF, on Eliquis, CKD 3B, breast cancer SP chemoradiation presented to hospital on 6/24 with new onset seizures. Known history of sphenoidal meningioma that was noted to be increased in size on the CT scan with surrounding edema so she is transferred from [facility] for further evaluation.  She underwent LTM with no further seizures and was evaluated by neurosurgery and underwent bifrontal craniotomy for meningioma resection on 03/24/2020 and was transferred to the intensive care unit postoperatively with PCCM consulting while she was in ICU. Significant Hospital Events: 6/24 presented to [facility] ED with new onset seizures, CTH with enlarging meningioma with edema, loaded with Keppra 7/3 Underwent bifrontal craniotomy for meningioma 7/6 CTH with new 7 mm thick subdural hematoma at the foramen magnum and tracking adjacent to the basion and odontoid not currently causing critical stenosis of the foramen magnum per radiology read, but merits surveillance. 7/11 recurrent sz like activity. Not correlated on EEG. CT unchanged.  7/13 neurology signed off. 7/21 palliative care was consulted. 7/23 CT chest and abdomen was performed negative for PE or significant pneumonia, without any hemorrhage 7/25 MRI brain without contrast shows increasing mass effect, SDH as well as subacute/acute small scattered infarct. 7/26 tube feeds resumed, neurology reconsulted.  Eliquis resumed per neurosurgery 7/27 oxygenation improving, mentation improving, neurology following.  CTA head and neck without any LVO.  Lower extremity Doppler without any DVT.  Echo shows new systolic dysfunction with global hypokinesis EF 40 to 45% and significant RV  failure. 7/29 oxygenation improving to 6 L saturating 94%. 7/30 had another aspiration event. Tube feeds on hold. Now on NRB. Remains full code. No tracheostomy per husband. Per Neurosurgery, give at least two weeks if not more to see if she improves.   Assessment and Plan: Sphenoidal meningioma, vasogenic edema and new onset seizures Postoperative right inferior lobe edema, subdural hemorrhage along the clivus, SAH and left occipital horn without hydrocephalus Narrowing of the foramen magnum due SDH Tonic-clonic seizure with no previous history of seizures Initially presented to [facility], under hospitalist service and transferred to [facility]. Neurosurgery was consulted. S/P bifrontal craniotomy for meningioma resection on 03/24/2022 Patient was started on Keppra and Vimpat. Underwent multiple EEG as well as LTM.  Last EEG 7/28 negative for any acute seizures. MRI 7/25 shows evidence of SDH and SAH.  Which was present on prior CT as well postoperatively and neurosurgery feels it does not require any therapy or intervention. There is also report for increasing edema. Management per neurosurgery. Per my discussion No indication for use of steroids for now. Anticoagulation with Apixaban resumed on 7/28 No significant improvement in mentation, Per Neurosurgery allow at least 2 weeks from 7/30 to assess improvement.    Sepsis in setting of HCAP and Recurrent Aspiration pneumonia  Acute hypoxic respiratory failure Febrile on 7/23 on 100.9 BC x2 NGTD, repeat BC X 2 NGTD  SARS COVID-negative UA unremarkable for infection, UC growing multiple species Chest x-ray showed "New hazy opacities projecting over both upper lobes in the suprahilar regions could reflect developing infection in the correct clinical setting CT chest, abdomen/pelvis, showed no PE, multifocal groundglass opacity likely pulmonary edema. CT abdomen pelvis with no evidence of hemorrhage PCCM was consulted and  pt  was switched to IV Unasyn. Continue DuoNebs, nebulizers Continue supplemental oxygen as needed Pulmonary toilet with Chest Vest and NT suction. Briefly had improvement in oxygenation on 6 L, saturating 93%. On 7/30 worsening hypoxia and needed NRB.  Continue PRN IV lasix.  X1 dose ordered.   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 Currently appears to have severe exacerbation of heart failure. Repeat echocardiogram on 04/17/2022 shows EF dropping to 40 to 45% with global hypokinesis.  RV function also moderately low. CT PE protocol as well as lower extremity Doppler have ruled out VTE/PE Continue with IV Lasix as needed. Family okay with the use of IV albumin in case needed for hypotension or to augment diuresis. Continue midodrine support for hypotension Continue chest vest. Due to hypotension currently unable to initiate GDMT.  RV dysfunction. PE ruled out.  Possibly due to pneumonia. Discussed with pulmonary. Patient will require repeat echocardiogram in 6 months.   Acute embolic CVA-incidental Patient has history of A-fib. MRI brain shows evidence of restricted diffusion and left cerebellum, bilateral occipital lobe, left parietal lobe, bilateral frontal lobe, concerning for small scattered acute and subacute infarct most likely embolic in the setting of A-fib. Anticoagulation is currently resumed. Echocardiogram shows mildly worsening EF.   Patient currently remains n.p.o. per speech therapy evaluation. PT and OT following.  Recommend SNF. Currently on Crestor.  LDL 106 in April 2023.  Currently due to being on continuous tube feeding lipid panel will not be accurate. We will follow neurology recommendation.   Postoperative acute blood loss anemia-expected. Iron deficiency anemia Acute on chronic  thrombocytopenia Hemoglobin dropped to 6.5 on 7/22 Transfused 2 units of PRBC on 7/22 Anemia panel iron 15, sats 8, ferritin 440, vitamin B12-->356 Continue monitor for signs and symptoms bleeding; no overt bleeding noted Platelet counts now improving from nadir of 113. Continue iron support.  Dysphagia Recurrent aspiration Dietitian following speech therapy following. Currently unable to swallow safely and therefore remains NPO. Currently on tube feeding. I suspect that the patient will most likely require a GJ-tube placement. Cortrak placed on 03/28/2022. Need to be replaced on 04/28/2022.  [place] discussion Due to multiple comorbidities, minimally responsive state, overall poor prognosis Palliative care consulted for further goals of care discussion.  As of 7/30 per neurosurgery, "Would certainly give at least two weeks if not more to see if she improves." Discussed with husband and daughter on 7/30. Husband does not want the patient to go to tracheostomy.  Hypokalemia Replace as needed  Hypernatremia. In the setting of diuresis. Improving with increasing free water.   Anxiety and Depression Continue with sertraline 25 mg per tube daily    HTN-now hypotension Paroxysmal Atrial Fibrillation Hyperlipidemia Echo 6/9 with EF 55% and Grade I diastolic dysfunction; previously had a normal diastolic function Continue Amiodarone 200 mg p.o. per tube daily, and Rosuvastatin 20 mg per tube daily Hold carvedilol 6.25 mg per tube twice daily. While the patient's anticoagulation has been resumed, patient will require close observation given her finding of SDH on CT and MRI. Blood pressure remains soft therefore will continue midodrine.   Type 2 DM controlled with hyperlipidemia and peripheral vascular disease Continue SSI, Accu-Cheks, hypoglycemic protocol Hemoglobin A1c 6.3 in 02/27/2022. Hold the long-acting insulin as the patient will be not on tube feed anymore.   GERD/GI  prophylaxis Continue pantoprazole 40 mg per tube daily   Obesity  Body mass index is 39.31 kg/m.  Placing the patient at high risk of poor outcome.  She may also have undiagnosed sleep apnea.   Pressure ulcer left buttock stage II not present on admission. Continue wound care foam dressing and frequent turning.  Limited access. Patient has poor venous access.  May require PICC line placement.  Discussed with daughter and have received consent.  Subjective: No nausea no vomiting but no fever no chills no chest pain abdominal.  Oxygenation improved to 10 L now.   Physical Exam: Vitals:   04/21/22 0500 04/21/22 0800 04/21/22 1102 04/21/22 1440  BP:   105/63 103/63  Pulse:  92 80 87  Resp:  '16 18 19  '$ Temp:   97.9 F (36.6 C) 98.5 F (36.9 C)  TempSrc:   Oral Axillary  SpO2:  97% 100% 96%  Weight: 82.4 kg     Height:       General: Appear in moderate distress; no visible Abnormal Neck Mass Or lumps, Conjunctiva normal Cardiovascular: S1 and S2 Present, no Murmur, Respiratory: increased respiratory effort, Bilateral Air entry present and bilateral Crackles, no wheezes Abdomen: Bowel Sound present, Non tender  Extremities: no Pedal edema Neurology: lethargic and non verbal, not to follow commands Gait not checked due to patient safety concerns  Data Reviewed: I have Reviewed nursing notes, Vitals, and Lab results since pt's last encounter. Pertinent lab results CBC and I have ordered test including CBC and BMP I have discussed pt's care plan and test results with pulmonary.      Family Communication: Sister is at bedside.  Author: Berle Mull, MD 04/21/2022 7:36 PM  Please look on [email or web address] to find out who is on call.

## 2022-04-21 NOTE — Progress Notes (Signed)
NAME:  Tara [name], MRN:  [id number], DOB:  20-Jun-1946, LOS: 49 ADMISSION DATE:  03/15/2022, CONSULTATION DATE:  04/21/22 REFERRING MD:  Christella Noa, CHIEF COMPLAINT:  Meningioma, Seizure   History of Present Illness:  Tara [name] is a 76 y.o. F with PMH significant for Anxiety, Depression, T2DM, HTN, HL, cardiomyopathy, Atrial Fibrillation on Eliquis, CKD IIIb, breast Ca s/p chemoradiation in 2008 who presented to the ED 6/24 with new onset seizures.   She had a known planum sphenoidal meningioma that was increased in size on CT with surrounding edema, so was transferred to [facility] for further evaluation.  LTM showed no further seizures.  She was evaluated by neurosurgery and underwent bifrontal craniotomy for meningioma resection on 7/3 and was transferred to intensive care post-op with PCCM consult while in ICU.   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   Significant Hospital Events: Including procedures, antibiotic start and stop dates in addition to other pertinent events   6/24 presented to [facility] ED with new onset seizures, CTH with enlarging meningioma with edema, loaded with Keppra 7/3 Underwent bifrontal craniotomy for meningioma, monitor in ICU 7/5 stable, no  further seizure activity 7/6 less responsive, two episodes of vomiting overnight and increased oral secretions today.  Continuous blinking on exam. CTH yesterday with new 7 mm thick subdural hematoma at the foramen magnum and tracking adjacent to the basion and odontoid not currently causing critical stenosis of the foramen magnum per radiology read, but merits surveillance. 7/7 Better night and more awake and responsive, repeat head CT unchanged and no seizures on video EEG 7/9 Recurrent seizure overnight requiring '4mg'$  of IV ativan to break. This am less responsive with forced right gaze  7/10 no further seizure activity, awake, responding, no forced gaze 7/11 recurrent sz like activity. Not correlated on EEG. CT unchanged.  7/21 reconsulted for hypoxia and bilateral infiltrates 7/24 Continued increasing oxygen needs. CT Angio Chest >> No PE's, Multifocal ground glass opacity within both lungs, likely pulmonary edema, small right pleural effusion , Calcified granuloma in the right lung with calcified right hilar adenopathy, likely a sequela of remote granulomatous infection. Interim History / Subjective:  Over the weekend, patient had improved O2 requirement however developed respiratory distress yesterday due to suspected aspiration. Weaned to 10L  Weaned again to 7L while I was in the room  Objective   Blood pressure 97/60, pulse 92, temperature 98 F (36.7 C), temperature source Axillary, resp. rate 16, height '4\' 9"'$  (1.448 m), weight 82.4 kg, SpO2 97 %.    FiO2 (%):  [97 %] 97 %   Intake/Output Summary (Last 24 hours) at 04/21/2022 0953 Last data filed at 04/21/2022 0800 Gross per 24 hour  Intake 100 ml  Output 1000 ml  Net -900 ml   Filed Weights   04/13/22 0500 04/14/22 0500 04/21/22 0500  Weight: 79 kg 82.4 kg 82.4 kg   Physical Exam: [name]: Chronically ill and elderly-appearing, no acute  distress HENT: [place], frontal craniotomy Eyes: Eyes closed Respiratory: Diminished but  clear to auscultation bilaterally.  No crackles, wheezing or rales Cardiovascular: RRR, -M/R/G, no JVD Extremities:-Edema,-tenderness Neuro: Does not follow commands, no withdrawal to noxious stimuli  CXR 04/21/22 - increased left-sided opacities  Resolved Hospital Problem list   AKI  Assessment & Plan:   Acute respiratory failure with hypoxia Probable aspiration Dysphagia  -Doubt amiodarone toxicity -CTA negative for PE and + for bilateral GG infiltrates and pulmonary edema.  P -Mental status poses significant risk for recurrent aspiration. Palliative care confirmed full code status -Aspiration precautions -NPO. Plan for enteral nutrition per primary team -Agree with antibiotics -Wean oxygen for goal SpO2 >88% -She may require intermittent NTS -Pulmonary hygiene: Chest PT, Pulmicort BID, PRN Duoneb, PRN guaifenesin  Sphenoidal meningioma with associated vasogenic edema and seizure, now s/p bifrontal craniectomy New CVAs MRI 04/15/2022 Eliquis stopped due to drop in HGB and facial bruise -Op date 7/21  -post op had seizure like activity but none proven on eeg  P -Seizure precautions.  Continue Keppra, Vimpat -Appreciate neurology management -Postop care per neurosurgery. Recommends monitoring for next two weeks for neurologic improvement  GOC -palliative care following -Currently full code -Family has declined tracheostomy but would consider intubation to allow time for neurologic recovery  Discussed plan for team. Pulmonary will intermittently follow. Please call for any urgent concerns or questions  Best Practice (right click and "Reselect all SmartList Selections" daily)   Diet/type: tubefeeds  DVT prophylaxis: PAS GI prophylaxis: PPI Lines: N/A Foley:  N/A Code Status:  full code Last date of multidisciplinary goals of care discussion: 7/24  Critical care: NA   Care Time: 35 minutes  Rodman Pickle, MD 04/21/2022, 9:53 AM [facility]

## 2022-04-22 DIAGNOSIS — J9601 Acute respiratory failure with hypoxia: Secondary | ICD-10-CM | POA: Diagnosis not present

## 2022-04-22 DIAGNOSIS — T17908D Unspecified foreign body in respiratory tract, part unspecified causing other injury, subsequent encounter: Secondary | ICD-10-CM | POA: Diagnosis not present

## 2022-04-22 LAB — CBC WITH DIFFERENTIAL/PLATELET
Abs Immature Granulocytes: 0.27 10*3/uL — ABNORMAL HIGH (ref 0.00–0.07)
Basophils Absolute: 0.1 10*3/uL (ref 0.0–0.1)
Basophils Relative: 1 %
Eosinophils Absolute: 0.1 10*3/uL (ref 0.0–0.5)
Eosinophils Relative: 1 %
HCT: 31.7 % — ABNORMAL LOW (ref 36.0–46.0)
Hemoglobin: 9.7 g/dL — ABNORMAL LOW (ref 12.0–15.0)
Immature Granulocytes: 3 %
Lymphocytes Relative: 11 %
Lymphs Abs: 1.1 10*3/uL (ref 0.7–4.0)
MCH: 27.4 pg (ref 26.0–34.0)
MCHC: 30.6 g/dL (ref 30.0–36.0)
MCV: 89.5 fL (ref 80.0–100.0)
Monocytes Absolute: 1 10*3/uL (ref 0.1–1.0)
Monocytes Relative: 10 %
Neutro Abs: 7.3 10*3/uL (ref 1.7–7.7)
Neutrophils Relative %: 74 %
Platelets: 257 10*3/uL (ref 150–400)
RBC: 3.54 MIL/uL — ABNORMAL LOW (ref 3.87–5.11)
RDW: 17.5 % — ABNORMAL HIGH (ref 11.5–15.5)
WBC: 9.8 10*3/uL (ref 4.0–10.5)
nRBC: 0 % (ref 0.0–0.2)

## 2022-04-22 LAB — HEPATIC FUNCTION PANEL
ALT: 40 U/L (ref 0–44)
AST: 39 U/L (ref 15–41)
Albumin: 1.9 g/dL — ABNORMAL LOW (ref 3.5–5.0)
Alkaline Phosphatase: 78 U/L (ref 38–126)
Bilirubin, Direct: 0.2 mg/dL (ref 0.0–0.2)
Indirect Bilirubin: 0.5 mg/dL (ref 0.3–0.9)
Total Bilirubin: 0.7 mg/dL (ref 0.3–1.2)
Total Protein: 6.3 g/dL — ABNORMAL LOW (ref 6.5–8.1)

## 2022-04-22 LAB — GLUCOSE, CAPILLARY
Glucose-Capillary: 103 mg/dL — ABNORMAL HIGH (ref 70–99)
Glucose-Capillary: 103 mg/dL — ABNORMAL HIGH (ref 70–99)
Glucose-Capillary: 105 mg/dL — ABNORMAL HIGH (ref 70–99)
Glucose-Capillary: 139 mg/dL — ABNORMAL HIGH (ref 70–99)
Glucose-Capillary: 91 mg/dL (ref 70–99)

## 2022-04-22 LAB — BASIC METABOLIC PANEL
Anion gap: 10 (ref 5–15)
BUN: 27 mg/dL — ABNORMAL HIGH (ref 8–23)
CO2: 29 mmol/L (ref 22–32)
Calcium: 8.7 mg/dL — ABNORMAL LOW (ref 8.9–10.3)
Chloride: 106 mmol/L (ref 98–111)
Creatinine, Ser: 0.99 mg/dL (ref 0.44–1.00)
GFR, Estimated: 59 mL/min — ABNORMAL LOW (ref >60)
Glucose, Bld: 93 mg/dL (ref 70–99)
Potassium: 3.2 mmol/L — ABNORMAL LOW (ref 3.5–5.1)
Sodium: 145 mmol/L (ref 135–145)

## 2022-04-22 LAB — MAGNESIUM: Magnesium: 2.2 mg/dL (ref 1.7–2.4)

## 2022-04-22 MED ORDER — OSMOLITE 1.5 CAL PO LIQD
1000.0000 mL | ORAL | Status: DC
  Administered 2022-04-22 – 2022-04-26 (×6): 1000 mL
  Filled 2022-04-22 (×3): qty 1000

## 2022-04-22 MED ORDER — POTASSIUM CHLORIDE 20 MEQ PO PACK
60.0000 meq | PACK | Freq: Once | ORAL | Status: AC
  Administered 2022-04-22: 60 meq via ORAL
  Filled 2022-04-22: qty 3

## 2022-04-22 MED ORDER — PROSOURCE TF PO LIQD
45.0000 mL | Freq: Three times a day (TID) | ORAL | Status: DC
Start: 2022-04-22 — End: 2022-05-01
  Administered 2022-04-22 – 2022-05-01 (×27): 45 mL
  Filled 2022-04-22 (×29): qty 45

## 2022-04-22 MED ORDER — ONDANSETRON HCL 4 MG/5ML PO SOLN
4.0000 mg | Freq: Two times a day (BID) | ORAL | Status: DC
  Administered 2022-04-22 – 2022-04-23 (×4): 4 mg via ORAL
  Filled 2022-04-22 (×4): qty 5

## 2022-04-22 MED ORDER — SODIUM CHLORIDE 3 % IN NEBU: 4.0000 mL | INHALATION_SOLUTION | RESPIRATORY_TRACT | Status: AC | PRN

## 2022-04-22 NOTE — Progress Notes (Signed)
Progress Note Patient: Tara [name] TDD:[id number] DOB: [birth year] DOA: 03/15/2022  DOS: the patient was seen and examined on 04/22/2022  Brief hospital course: 76 year old African-American female PMH of anxiety, depression, type II DM, HLD, PAF, on Eliquis, CKD 3B, breast cancer SP chemoradiation presented to hospital on 6/24 with new onset seizures. Known history of sphenoidal meningioma that was noted to be increased in size on the CT scan with surrounding edema so she is transferred from [facility] for further evaluation.  She underwent LTM with no further seizures and was evaluated by neurosurgery and underwent bifrontal craniotomy for meningioma resection on 03/24/2020 and was transferred to the intensive care unit postoperatively with PCCM consulting while she was in ICU. Significant Hospital Events: 6/24 presented to [facility] ED with new onset seizures, CTH with enlarging meningioma with edema, loaded with Keppra 7/3 Underwent bifrontal craniotomy for meningioma 7/6 CTH with new 7 mm thick subdural hematoma at the foramen magnum and tracking adjacent to the basion and odontoid not currently causing critical stenosis of the foramen magnum per radiology read, but merits surveillance. 7/11 recurrent sz like activity. Not correlated on EEG. CT unchanged.  7/13 neurology signed off. 7/21 palliative care was consulted. 7/23 CT chest and abdomen was performed negative for PE or significant pneumonia, without any hemorrhage 7/25 MRI brain without contrast shows increasing mass effect, SDH as well as subacute/acute small scattered infarct. 7/26 tube feeds resumed, neurology reconsulted.  Eliquis resumed per neurosurgery 7/27 oxygenation improving, mentation improving, neurology following.  CTA head and neck without any LVO.  Lower extremity Doppler without any DVT.  Echo shows new systolic dysfunction with global hypokinesis EF 40 to 45% and significant RV failure. 7/29 oxygenation improving to 6 L  saturating 94%. 7/30 had another aspiration event. Tube feeds on hold. Now on NRB. Remains full code. No tracheostomy per husband. Per Neurosurgery, give at least two weeks if not more to see if she improves.  8/1 oxygenation improving again to 5 L.  Trickle tube feeds started.  Zofran and PPI scheduled twice daily. 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 Neurosurgery was consulted. S/P bifrontal craniotomy for meningioma resection on 03/24/2022 Patient was started on Keppra and Vimpat.  Neurology was consulted. Underwent multiple EEG as well as LTM.  Last EEG 7/28 negative for any acute seizures. MRI 7/25 shows evidence of SDH and SAH.  Which was present on prior CT as well postoperatively and neurosurgery feels it does not require any therapy or intervention. Per my discussion No indication for use of steroids for now. Anticoagulation with Apixaban resumed on 7/28 Per Neurosurgery allow at least 2 weeks from 7/30 to assess improvement.  Management per neurosurgery.   Sepsis in setting of HCAP and Recurrent Aspiration pneumonia  Acute hypoxic respiratory failure Febrile on 7/23 on 100.9 BC x2 NGTD, repeat BC X 2 NGTD  SARS COVID-negative UA unremarkable for infection, UC growing multiple species Chest x-ray showed "New hazy opacities projecting over both upper lobes in the suprahilar regions could reflect developing infection in the correct clinical setting CT chest, abdomen/pelvis, showed no PE, multifocal groundglass opacity likely pulmonary edema. CT abdomen pelvis with no evidence of  hemorrhage PCCM  was consulted and pt was switched to IV Unasyn. Continue DuoNebs, nebulizers Continue supplemental oxygen as needed Pulmonary toilet with Chest Vest and NT suction. On 7/30 worsening hypoxia and needed NRB.  Most likely another aspiration event. Continue PRN IV lasix. Currently on 14-day antibiotic course with last day on 8/4.  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 Currently appears to have severe exacerbation of heart failure. Repeat echocardiogram on 04/17/2022 shows EF dropping to 40 to 45% with global hypokinesis.  RV function also moderately low. CT PE protocol as well as lower extremity Doppler have ruled out VTE/PE Continue with IV Lasix as needed. Continue midodrine support for hypotension Continue chest vest with hypertonic saline. Due to hypotension currently unable to initiate GDMT.   RV dysfunction. PE ruled out.  Possibly due to pneumonia. Discussed with pulmonary. Patient will require repeat echocardiogram in 6 months.   Acute embolic CVA-incidental Patient has history of A-fib. MRI brain shows evidence of restricted diffusion and left cerebellum, bilateral occipital lobe, left parietal lobe, bilateral frontal lobe, concerning for small scattered acute and subacute infarct most likely embolic in the setting of A-fib. Anticoagulation is currently resumed. Echocardiogram shows mildly worsening EF.   Patient currently remains n.p.o. per speech therapy evaluation. PT and OT following.  Recommend SNF. Currently on Crestor.  LDL 106 in April 2023.  Currently due to being on continuous tube feeding lipid panel will not be accurate. Neurology signed off again.   Postoperative acute blood loss anemia-expected. Iron deficiency anemia Acute on chronic thrombocytopenia Hemoglobin dropped to 6.5 on  7/22 Transfused 2 units of PRBC on 7/22 Anemia panel iron 15, sats 8, ferritin 440, vitamin B12-->356 Continue monitor for signs and symptoms bleeding; no overt bleeding noted Platelet counts now improving from nadir of 113. Continue iron support.   Dysphagia Recurrent aspiration Dietitian following speech therapy following. Currently unable to swallow safely and therefore remains NPO. Currently on tube feeding. I suspect that the patient will most likely require a GJ-tube placement. Cortrak placed on 03/28/2022.  Per my discussion with RD, given that the tube is already postpyloric no indication for further advancement. Monitor for pressure injury due to core track in the nares.   [place] discussion Due to multiple comorbidities, minimally responsive state, overall poor prognosis Palliative care consulted for further goals of care discussion.  As of 7/30 per neurosurgery, "Would certainly give at least two weeks if not more to see if she improves." Husband does not want the patient to go to tracheostomy.   Hypokalemia Replace as needed   Hypernatremia. In the setting of diuresis. Improving with increasing free water.   Anxiety and Depression Continue with sertraline 25 mg per tube daily    HTN- now hypotension  Paroxysmal Atrial Fibrillation Hyperlipidemia Continue Amiodarone 200 mg p.o. per tube daily, and Rosuvastatin 20 mg per tube daily Hold carvedilol 6.25 mg per tube twice daily. While the patient's anticoagulation has been resumed, patient will require close observation given her finding of SDH on CT and MRI. Blood pressure remains soft therefore will continue midodrine.   Type 2 DM controlled with hyperlipidemia and peripheral vascular disease Continue SSI, Accu-Cheks, hypoglycemic protocol Hemoglobin A1c 6.3 in 02/27/2022. Hold the long-acting insulin as the patient will be not on tube feed anymore.   GERD/GI prophylaxis Continue pantoprazole 40 mg per tube daily    Obesity Body mass index is  39.31 kg/m.  Placing the patient at high risk of poor outcome.  She may also have undiagnosed sleep apnea.   Pressure ulcer left buttock stage II not present on admission. Continue wound care foam dressing and frequent turning.   Limited access. Patient has poor venous access.  May require PICC line placement.  Discussed with daughter and have received consent.  Subjective: Remains minimally responsive.  Reportedly I did talk with the family and open her eyes although currently not following any commands.  Not opening her eyes.  Had a bowel movement.  Physical Exam: Vitals:   04/22/22 0500 04/22/22 0800 04/22/22 1148 04/22/22 1449  BP:  97/64 (!) 160/66 (!) 110/58  Pulse:      Resp:      Temp:  98.3 F (36.8 C) 98.3 F (36.8 C) 97.6 F (36.4 C)  TempSrc:  Oral Oral Tympanic  SpO2:      Weight: 82.4 kg     Height:       General: Appear in mild distress; no visible Abnormal Neck Mass Or lumps, Conjunctiva normal Cardiovascular: S1 and S2 Present, no Murmur, Respiratory: good respiratory effort, Bilateral Air entry present and faint Crackles, no wheezes Abdomen: Bowel Sound present, Non tender  Extremities: trace Pedal edema Neurology: Unresponsive and non verbal , moving head and extremities.  Intermittently spiking not seen today. Gait not checked due to patient safety concerns   Data Reviewed: I have Reviewed nursing notes, Vitals, and Lab results since pt's last encounter. Pertinent lab results CBC and BMP I have ordered test including CBC and BMP    Family Communication: Sister-in-law at bedside.  Discussed with daughter on the phone.  Disposition: Status is: Inpatient Remains inpatient appropriate because: Still dependent on tube feedings.  Author: Berle Mull, MD 04/22/2022 7:40 PM  Please look on [email or web address] to find out who is on call.

## 2022-04-22 NOTE — Progress Notes (Signed)
Patient ID: Tara [name], female   DOB: [birth year], 76 y.o.   MRN: [id number] BP (!) 110/58 (BP Location: Right Arm)   Pulse 80   Temp 97.6 F (36.4 C) (Tympanic)   Resp 16   Ht '4\' 9"'$  (1.448 m)   Wt 82.4 kg   SpO2 99%   BMI 39.31 kg/m  Has opened eyes and spoken today Moving her extremities There is obviously improvement Was speaking with her husband and sisters today Continue with therapy

## 2022-04-22 NOTE — Progress Notes (Signed)
Speech Language Pathology Treatment: Dysphagia  Patient Details Name: Tara [name] MRN: [id number] DOB: [birth year] Today's Date: 04/22/2022 Time: 1205-1220 SLP Time Calculation (min) (ACUTE ONLY): 15 min  Assessment / Plan / Recommendation Clinical Impression  Tara [name] was sitting in recliner with sister-in-law at bedside. She was nonverbal; opened her eyes to stimulation.  Did not follow commands.  Accepted limited ice chips, masticating without cessation in a perseveratory pattern. Onset of swallow was followed by coughing. She continues to have difficulty protecting her airway.  TF have been held due to aspiration event on 7/30. When appropriate, it may be beneficial to explore PEG given the length of time her cortrak has been in place.    SLP will follow.    HPI HPI: Pt is a 76 y/o who presented 6/24 with new onset seizures. 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 EEG negative for seizures. Neurosurgey consulted and did not recommend meningioma resection as of 6/27. Decline in swallow function noted overnight 6/27. Dx acute toxic metabolic encephalopathy suspected secondary to keppra. Pt s/p Bifrontal craniotomy for resection of meningioma 7/3.  Cortrak 7/7. 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 PMH: HTN, HLD, GERD, CKD stage IIIb, chronic HFpEF, meningioma, breast CA-s/p right lumpectomy and chemoradiation in 2008, depression/anxiety. BSE 03/11/22: regular texture diet and thin liquids without need for follow up.      SLP Plan  Continue with current plan of care      Recommendations for follow up therapy are one component of a multi-disciplinary discharge planning process, led by the attending physician.  Recommendations may be updated based on patient status, additional functional criteria and insurance authorization.    Recommendations  Diet recommendations: NPO                Oral Care Recommendations: Oral care QID Follow Up  Recommendations: Skilled nursing-short term rehab (<3 hours/day) Assistance recommended at discharge: Frequent or constant Supervision/Assistance SLP Visit Diagnosis: Dysphagia, oropharyngeal phase (R13.12) Plan: Continue with current plan of care         Tara [name] L. Tivis Ringer, MA CCC/SLP Clinical Specialist - Acute Care SLP Acute Rehabilitation Services Office number [phone]   Tara [name]  04/22/2022, 3:17 PM

## 2022-04-22 NOTE — Progress Notes (Signed)
Nutrition Follow-up  DOCUMENTATION CODES:   Obesity unspecified  INTERVENTION:   Restart TF Osmolite 1.5 @ 20 ml/hr and increase by 10 ml every 12 hours to goal rate of 40 ml/hr 45 ml ProSource TF BID 200 ml free water flush every 4 hours Communicated plan to RN.   TF regimen at goal provides:  1575 kcal, 93 grams protein, and 729 ml free water.   -- Discussed with provider. Pt with repeat episodes of aspiration. Pt is not having nausea/vomiting. Pt's cortrak tube tip is post pyloric. Pt is aspirating on her secretions. No changes to the tube or TF would prevent her from aspirating her secretions. Placement of a PEG is being considered for long-term means of nutrition. PEG placement does not prevent aspiration of secretions.   Continue Multivitamin w/ minerals daily  NUTRITION DIAGNOSIS:   Inadequate oral intake related to inability to eat as evidenced by NPO status. - Ongoing  GOAL:   Patient will meet greater than or equal to 90% of their needs - Progressing with TF advancement   MONITOR:   I & O's  REASON FOR ASSESSMENT:   Consult Enteral/tube feeding initiation and management  ASSESSMENT:   Pt with PMH of DM, Afib on Eliquis, CKD stage III, CHF, HTN, anemia of chronic illness and sphenoidal meningioma admitted for new onset seizures.  Discussed with provider. TF off due to aspiration events, however this appears to be aspiration of her secretions and not vomiting of her TF. Cortrak tip is post pyloric. Pt required 10L HFNC and is now back to room air.  Per SLP there has been no progress in swallowing function and SLP recommends consider PEG if within goals of care.   6/24 - admit with new onset seizures 7/03 - s/p bifrontal craniotomy for meningioma resection 7/06 - less responsive, vomiting overnight with suspected aspiration 7/07 - s/p cortrak placement; tip distal stomach/proximal duodenum  7/08 - TF changed to Glucerna 1.5 per MD  7/24 - TF held due to  concern of aspiration 07/26 - TF resumed 07/30 - TF held due to aspiration 08/01 - TF resumed at trickle and advanced slowly. Discussed with provider that change in TF/tube would not prevent aspiration of secretions.   Sister-in-law at bedside. Pt did not respond to any questions, eyes remained closed.   Medications reviewed and include: NovoLog, zofran every 12 hours (8/1), MVI, Protonix, miralax, Senokot, IV antibiotics   Labs reviewed: Potassium 3.2  Diet Order:   Diet Order             Diet NPO time specified  Diet effective now                   EDUCATION NEEDS:   Not appropriate for education at this time  Skin:  Skin Assessment: Skin Integrity Issues: Skin Integrity Issues:: Stage II Stage II: buttocks  Last BM:  7/30 large  Height:  Ht Readings from Last 1 Encounters:  03/15/22 '4\' 9"'$  (1.448 m)   Weight:  Wt Readings from Last 1 Encounters:  04/22/22 82.4 kg   BMI:  Body mass index is 39.31 kg/m.  Estimated Nutritional Needs:   Kcal:  1500-1700  Protein:  85-100 grams  Fluid:  >1.5 L/day   Lockie Pares., RD, LDN, CNSC See AMiON for contact information

## 2022-04-23 DIAGNOSIS — R569 Unspecified convulsions: Secondary | ICD-10-CM | POA: Diagnosis not present

## 2022-04-23 LAB — BASIC METABOLIC PANEL
Anion gap: 8 (ref 5–15)
BUN: 23 mg/dL (ref 8–23)
CO2: 28 mmol/L (ref 22–32)
Calcium: 8.6 mg/dL — ABNORMAL LOW (ref 8.9–10.3)
Chloride: 112 mmol/L — ABNORMAL HIGH (ref 98–111)
Creatinine, Ser: 0.89 mg/dL (ref 0.44–1.00)
GFR, Estimated: >60 mL/min (ref >60)
Glucose, Bld: 151 mg/dL — ABNORMAL HIGH (ref 70–99)
Potassium: 3.5 mmol/L (ref 3.5–5.1)
Sodium: 148 mmol/L — ABNORMAL HIGH (ref 135–145)

## 2022-04-23 LAB — CBC WITH DIFFERENTIAL/PLATELET
Abs Immature Granulocytes: 0.24 10*3/uL — ABNORMAL HIGH (ref 0.00–0.07)
Basophils Absolute: 0.1 10*3/uL (ref 0.0–0.1)
Basophils Relative: 1 %
Eosinophils Absolute: 0.1 10*3/uL (ref 0.0–0.5)
Eosinophils Relative: 1 %
HCT: 30.7 % — ABNORMAL LOW (ref 36.0–46.0)
Hemoglobin: 9.3 g/dL — ABNORMAL LOW (ref 12.0–15.0)
Immature Granulocytes: 3 %
Lymphocytes Relative: 11 %
Lymphs Abs: 1 10*3/uL (ref 0.7–4.0)
MCH: 27.3 pg (ref 26.0–34.0)
MCHC: 30.3 g/dL (ref 30.0–36.0)
MCV: 90 fL (ref 80.0–100.0)
Monocytes Absolute: 1 10*3/uL (ref 0.1–1.0)
Monocytes Relative: 11 %
Neutro Abs: 6.7 10*3/uL (ref 1.7–7.7)
Neutrophils Relative %: 73 %
Platelets: 254 10*3/uL (ref 150–400)
RBC: 3.41 MIL/uL — ABNORMAL LOW (ref 3.87–5.11)
RDW: 17.4 % — ABNORMAL HIGH (ref 11.5–15.5)
WBC: 9.2 10*3/uL (ref 4.0–10.5)
nRBC: 0 % (ref 0.0–0.2)

## 2022-04-23 LAB — GLUCOSE, CAPILLARY
Glucose-Capillary: 141 mg/dL — ABNORMAL HIGH (ref 70–99)
Glucose-Capillary: 145 mg/dL — ABNORMAL HIGH (ref 70–99)
Glucose-Capillary: 171 mg/dL — ABNORMAL HIGH (ref 70–99)
Glucose-Capillary: 180 mg/dL — ABNORMAL HIGH (ref 70–99)

## 2022-04-23 LAB — MAGNESIUM: Magnesium: 2.1 mg/dL (ref 1.7–2.4)

## 2022-04-23 MED ORDER — ONDANSETRON HCL 4 MG PO TABS: 4.0000 mg | ORAL_TABLET | Freq: Four times a day (QID) | ORAL | Status: DC | PRN

## 2022-04-23 MED ORDER — FREE WATER
300.0000 mL | Status: DC
  Administered 2022-04-23 – 2022-05-02 (×53): 300 mL

## 2022-04-23 MED ORDER — ONDANSETRON HCL 4 MG/2ML IJ SOLN: 4.0000 mg | Freq: Four times a day (QID) | INTRAMUSCULAR | Status: DC | PRN

## 2022-04-23 MED ORDER — ONDANSETRON HCL 4 MG PO TABS
4.0000 mg | ORAL_TABLET | Freq: Two times a day (BID) | ORAL | Status: DC
  Administered 2022-04-24 – 2022-05-05 (×24): 4 mg
  Filled 2022-04-23 (×25): qty 1

## 2022-04-23 NOTE — Progress Notes (Signed)
PROGRESS NOTE    Tara [name]  WGN:[id number] DOB: [birth year] DOA: 03/15/2022 PCP: [name], MD    Brief Narrative:   Tara [name] is a 76 y.o. female with past medical history significant for anxiety/depression, type 2 diabetes mellitus, hyperlipidemia, paroxysmal atrial fibrillation on Eliquis, essential hypertension, nonischemic cardiomyopathy, CKD stage IIIb, breast cancer s/p chemoradiation, history of sphenoidal meningioma who presented to [facility] ED on 6/24 with new onset seizures.    Patient recently discharged on 03/12/2022 after she presented for weakness, fatigue, confusion and slurred speech.  She was initially admitted to the intensive care unit with acute renal failure, electrolyte disturbance and A-fib with RVR with hypotension.  She was started on amiodarone infusion at that time and CT head during that admission showed interval increase in the size of her previously noted meningioma.  Patient was hydrated with improvement of renal function and discharged home with Decadron taper and follow-up with neurosurgery in 7-10 days.  Family reports that she never returned to her baseline following that admission.  In the ED, CT head with no acute process with stable sphenoidal meningioma measuring 2.4 x 3.3 x 2.8 cm with hypodense lesion in the frontal lobe medially to the adjacent mass representing edema/infarct.  Neurosurgery was consulted and patient was transferred to [facility] for further evaluation and management under the hospitalist service.  Significant Hospital Events: 6/24 presented to [facility] ED with new onset seizures, CTH with enlarging meningioma with edema, loaded with Keppra 7/3 Underwent bifrontal craniotomy for meningioma 7/6 CTH with new 7 mm thick subdural hematoma at the foramen magnum and tracking adjacent to the basion and odontoid not currently causing critical stenosis of the foramen magnum per radiology read, but merits surveillance. 7/11  recurrent sz like activity. Not correlated on EEG. CT unchanged.  7/13 neurology signed off. 7/21 palliative care consulted. 7/23 CT chest and abdomen was performed negative for PE or significant pneumonia, without any hemorrhage 7/25 MRI brain without contrast shows increasing mass effect, SDH as well as subacute/acute small scattered infarct. 7/26 tube feeds resumed, neurology reconsulted.  Eliquis resumed per neurosurgery 7/27 oxygenation improving, mentation improving, neurology following.  CTA head and neck without any LVO.  Lower extremity Doppler without any DVT.  Echo shows new systolic dysfunction with global hypokinesis EF 40 to 45% and significant RV failure. 7/29 oxygenation improving to 6 L saturating 94%. 7/30 had another aspiration event. Tube feeds on hold. Now on NRB. Remains full code. No tracheostomy per husband. Per Neurosurgery, give at least two weeks if not more to see if she improves.  8/1 oxygenation improving again to 5 L.  Trickle tube feeds started.  Zofran and PPI scheduled twice daily. 8/2: No significant change, husband reports patient responding occasionally to verbal stimuli  Assessment & Plan:   Sphenoidal meningioma with vasogenic edema Seizure Subdural hemorrhage along [place] Subarachnoid hemorrhage and left occipital horn without hydrocephalus Narrowing foramen magnum due to SDH Patient presenting as a transfer from [facility] following tonic-clonic seizure at home.  Patient with known history of meningioma with interval increase.  Neurosurgery was consulted and patient underwent bifrontal craniotomy for meningioma resection on 03/24/2022.  MRI brain 7/25 with evidence of SDH and SAH.  Neurology was initially consulted and patient underwent multiple EEGs and LTM while inpatient, last EEG 7/28 negative for any acute seizures. --Palliative care following, appreciate assistance --Apixaban resumed 7/28 --Vimpat 200 mg per tube twice daily --Keppra 1000  mg per tube twice  daily --Per neurosurgery, will at least 2 weeks from 7/30 to assess improvement --Further management per neurosurgery  Sepsis in the setting of HCAP and recurrent aspiration pneumonia Acute hypoxic respiratory failure Developed fever of 100.9 on 7/23, COVID-19 PCR negative.  Chest x-ray with new hazy opacities projecting over both upper lobes in the suprahilar regions consistent with developing infection.  CT chest, abdomen/pelvis with no PE, multifocal groundglass opacities likely pulmonary edema. --MRSA PCR negative. 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  Repeat TTE 04/17/2022 with EF 40-45% with global hypokinesis, RV function also moderately low. --net negative 7.9L since admission --wt 85>>>82.4kg --Strict I's and O's Daily weights --Titrate supplemental oxygen to maintain SPO2 >92% --Intermittent diuresis with IV Lasix  Acute embolic CVA, incidental Patient with known history of atrial fibrillation, MRI brain with evidence of restricted effusion left cerebellum, bilateral occipital lobe, left parietal lobe, bilateral frontal lobe; concerning for small scattered acute and subacute infarct most likely embolic in the setting of history of A-fib.  Repeat TTE showed mildly degraded LVEF of 40-45%.  Eliquis was initially held due to/SAH and need for meningioma resection as above but now has been resumed.  LDL 106 April 2023.  Neurology was initially consulted but now signed off. --SLP, remain n.p.o. with core track in  place and tube feeds initiated --Continue PT/OT efforts while inpatient, currently recommending SNF --Crestor 20 mg per tube daily  Postoperative acute blood loss anemia, expected Iron deficiency anemia Acute on chronic thrombocytopenia Patient was transfused 2 units of PRBCs on 7/22 for hemoglobin drop of 6.8.  Anemia panel with iron 15, sats 8, ferritin 440, vitamin B12 356. --Hgb  9.3>>>6.8>>7.7>8.3 --Plt 155>>>85>>81>>>257>254 --Continue intermittent monitoring of CBC  Dysphagia with recurrent aspiration Remains n.p.o. per recommendations of speech therapy.  Core track placed on 03/28/2022. --Continue tube feeds --If no further improvement, will need to consider PEG/G-tube placement if goals of care remain aggressive  Hypokalemia Potassium 3.5 today, continue to monitor and replace as needed  Hyponatremia --BMP daily  Anxiety/depression --Sertraline 25 mg per tube daily  Type 2 diabetes mellitus Hemogram A1C 5.9, well controlled.  Paroxysmal atrial fibrillation --Amiodarone 20 mg per tube daily --Apixaban 5 mg per tube twice daily  History of essential hypertension Now with hypotension --Midodrine 2.5 mg per tube BID  GERD: Protonix 40 mg per tube daily  Pressure ulcer left buttock stage II, not POA Pressure Injury 04/07/22 Buttocks Left Stage 2 -  Partial thickness loss of dermis presenting as a shallow open injury with a red, pink wound bed without slough. (Active)  04/07/22 0100  Location: Buttocks  Location Orientation: Left  Staging: Stage 2 -  Partial thickness loss of dermis presenting as a shallow open injury with a red, pink wound bed without slough.  Wound Description (Comments):   Present on Admission: No  --Continue wound care, frequent turning/offloading  Obesity Body mass index is 39.31 kg/m.  Discussed with patient needs for aggressive lifestyle changes/weight loss as this complicates all facets of care.  Outpatient follow-up with PCP.    DVT  prophylaxis: SCDs Start: 03/26/22 0840 Place TED hose  Start: 03/24/22 0616 Place and maintain sequential compression device Start: 03/23/22 0631 apixaban (ELIQUIS) tablet 5 mg    Code Status: Full Code Family Communication:   Disposition Plan:  Level of care: Progressive Status is: Inpatient Remains inpatient appropriate because: Per primary, neurosurgery, PT/OT currently recommending SNF placement     Procedures:  EEG 7/2, 7/7, 7/8, 7/11 Bifrontal craniotomy for resection of meningioma, Dr. Christella Noa 7/3  Antimicrobials:  Vancomycin 7/19 - 7/20 Cefepime 7/19 - 7/21 Cefazolin 7/3 - 7/3 Ampicillin 7/7 - 7/10, 7/21>>   Subjective: Patient seen examined bedside, resting comfortably lying in bed.  Remains poorly responsive.  Family present at bedside.  Per discussion with neurosurgery, will allow 2 weeks for neurological recovery.  Unable to assess further due to mental status.  No acute concerns overnight per nursing staff.  Objective: Vitals:   04/23/22 0500 04/23/22 0804 04/23/22 0843 04/23/22 1157  BP:  (!) 88/58  105/69  Pulse:  89  87  Resp:  18  18  Temp:  98.4 F (36.9 C)  98.3 F (36.8 C)  TempSrc:  Oral  Oral  SpO2:  97% 98% 96%  Weight: 82.4 kg     Height:        Intake/Output Summary (Last 24 hours) at 04/23/2022 1214 Last data filed at 04/23/2022 0618 Gross per 24 hour  Intake --  Output 400 ml  Net -400 ml   Filed Weights   04/21/22 0500 04/22/22 0500 04/23/22 0500  Weight: 82.4 kg 82.4 kg 82.4 kg    Examination:  Physical Exam: GEN: Chronically ill in appearance, NAD HEENT: Frontal craniotomy incision site noted, dry mucous membranes, eyes closed PULM: Decreased breath sounds bilateral bases, no crackles/wheezing, normal respiratory effort on 5 L nasal cannula CV: RRR w/o M/G/R GI: abd soft, NTND, NABS, no R/G/M MSK: No peripheral edema NEURO: Not following commands    Data Reviewed: I have personally reviewed following labs and imaging  studies  CBC: Recent Labs  Lab 04/19/22 0212 04/20/22 0225 04/21/22 0157 04/22/22 0636 04/23/22 0607  WBC 7.8 8.6 12.5* 9.8 9.2  NEUTROABS 5.7 6.3 10.0* 7.3 6.7  HGB 10.1* 9.3* 9.7* 9.7* 9.3*  HCT 32.3* 29.6* 31.0* 31.7* 30.7*  MCV 88.0 88.1 87.6 89.5 90.0  PLT 219 217 236 257 176   Basic Metabolic Panel: Recent Labs  Lab 04/19/22 2003 04/20/22 1141 04/21/22 0157 04/22/22 0636 04/23/22 0607  NA 147* 146* 147* 145 148*  K 3.4* 3.3* 3.5 3.2* 3.5  CL 105 109 106 106 112*  CO2 32 '29 31 29 28  '$ GLUCOSE 125* 146* 95 93 151*  BUN 35* 35* 35* 27* 23  CREATININE 1.07* 0.99 1.03* 0.99 0.89  CALCIUM 8.6* 8.7* 8.9 8.7* 8.6*  MG 2.2  --  2.1 2.2 2.1   GFR: Estimated Creatinine Clearance: 47.6 mL/min (by C-G formula based on SCr of 0.89 mg/dL). Liver Function Tests: Recent Labs  Lab 04/22/22 0636  AST 39  ALT 40  ALKPHOS 78  BILITOT 0.7  PROT 6.3*  ALBUMIN 1.9*   No results for input(s): "LIPASE", "AMYLASE" in the last 168 hours. No results for input(s): "AMMONIA" in the last 168 hours. Coagulation Profile: No results for input(s): "INR", "PROTIME" in the last 168 hours. Cardiac Enzymes: No results for input(s): "CKTOTAL", "CKMB", "CKMBINDEX", "TROPONINI" in the last 168 hours. BNP (last 3 results) No results for input(s): "PROBNP" in the last 8760 hours. HbA1C: No results for input(s): "HGBA1C" in the last 72 hours. CBG: Recent Labs  Lab 04/22/22 1132 04/22/22 1537 04/22/22 2351 04/23/22 0604 04/23/22 1155  GLUCAP 105* 103* 139* 145* 171*   Lipid Profile: No results for input(s): "CHOL", "HDL", "LDLCALC", "TRIG", "CHOLHDL", "LDLDIRECT" in the last 72 hours. Thyroid Function Tests: No results for input(s): "TSH", "T4TOTAL", "FREET4", "T3FREE", "THYROIDAB" in the last 72 hours. Anemia Panel: No results for input(s): "VITAMINB12", "FOLATE", "FERRITIN", "TIBC", "IRON", "RETICCTPCT" in the last 72 hours. Sepsis Labs: No results for input(s): "PROCALCITON",  "LATICACIDVEN" in the last 168 hours.  Recent Results (from the past 240 hour(s))  Culture, blood (Routine X 2) w Reflex to ID Panel     Status: None   Collection Time: 04/14/22  5:27 AM   Specimen: BLOOD LEFT HAND  Result Value Ref Range Status   Specimen Description BLOOD LEFT HAND  Final   Special Requests   Final    BOTTLES DRAWN AEROBIC ONLY Blood Culture results may not be optimal due to an inadequate volume of blood received in culture bottles   Culture   Final    NO GROWTH 5 DAYS Performed at [facility], [place] [address], [place], [place] [address]    Report Status 04/19/2022 FINAL  Final  Culture, blood (Routine X 2) w Reflex to ID Panel     Status: None   Collection Time: 04/14/22  5:51 AM   Specimen: BLOOD LEFT HAND  Result Value Ref Range Status   Specimen Description BLOOD LEFT HAND  Final   Special Requests   Final    BOTTLES DRAWN AEROBIC AND ANAEROBIC Blood Culture adequate volume   Culture   Final    NO GROWTH 5 DAYS Performed at [facility], [place] [address] [place], [place] [address]    Report Status 04/19/2022 FINAL  Final  SARS Coronavirus 2 by RT PCR (hospital order, performed in [facility] hospital lab) *cepheid single result test* Anterior Nasal Swab     Status: None   Collection Time: 04/14/22  1:12 PM   Specimen: Anterior Nasal Swab  Result Value Ref Range Status   SARS Coronavirus 2 by RT PCR NEGATIVE NEGATIVE Final    Comment: (NOTE) SARS-CoV-2 target nucleic acids are NOT DETECTED.  The SARS-CoV-2 RNA is generally detectable in upper and lower respiratory specimens during the acute phase of infection. The lowest concentration of SARS-CoV-2 viral copies this assay can detect is 250 copies / mL. A negative result does not preclude SARS-CoV-2 infection and should not be used as the sole basis for treatment or other patient management decisions.  A negative result may occur with improper specimen collection / handling, submission of  specimen other than nasopharyngeal swab, presence of viral mutation(s) within the areas targeted by this assay, and inadequate number of viral copies (<250 copies / mL). A negative result must be combined with clinical observations, patient history, and epidemiological information.  Fact Sheet for Patients:   https://www.patel.info/  Fact Sheet for Healthcare Providers: https://hall.com/  This test is not yet approved or  cleared by the [place] FDA and has been authorized for detection and/or diagnosis of SARS-CoV-2 by FDA under an Emergency Use Authorization (EUA).  This EUA will remain in effect (meaning this test can be used) for the duration of the COVID-19 declaration under Section 564(b)(1) of the Act, 21 U.S.C. section 360bbb-3(b)(1), unless the authorization is terminated or revoked sooner.  Performed at [facility], [place] [address], [place], [place] [address]  Radiology Studies: No results found.      Scheduled Meds:  amiodarone  200 mg Per Tube Daily   apixaban  5 mg Per Tube BID   budesonide (PULMICORT) nebulizer solution  0.25 mg Nebulization BID   Chlorhexidine Gluconate Cloth  6 each Topical Daily   feeding supplement (PROSource TF)  45 mL Per Tube TID   free water  200 mL Per Tube Q4H   insulin aspart  0-15 Units Subcutaneous Q6H   lacosamide  200 mg Per Tube BID   levETIRAcetam  1,000 mg Per Tube BID   midodrine  2.5 mg Per Tube BID WC   montelukast  10 mg Per Tube QHS   multivitamin with minerals  1 tablet Per Tube Daily   ondansetron  4 mg Oral Q12H   mouth rinse  15 mL Mouth Rinse 4 times per day   pantoprazole sodium  40 mg Per Tube BID   polyethylene glycol  17 g Per NG tube Daily   rosuvastatin  20 mg Per Tube Daily   senna  1 tablet Per Tube BID   sertraline  25 mg Per Tube Daily   Continuous Infusions:  ampicillin-sulbactam (UNASYN) IV 3 g (04/23/22 0859)   feeding  supplement (OSMOLITE 1.5 CAL) 1,000 mL (04/22/22 1655)     LOS: 39 days    Time spent: 49 minutes spent on chart review, discussion with nursing staff, consultants, updating family and interview/physical exam; more than 50% of that time was spent in counseling and/or coordination of care.    Farrell Broerman J [place] DO [facility] Available via Epic secure chat 7am-7pm After these hours, please refer to coverage provider listed on [email or web address] 04/23/2022, 12:14 PM

## 2022-04-23 NOTE — Progress Notes (Signed)
Patient ID: Tara [name], female   DOB: [birth year], 76 y.o.   MRN: [id number] BP 112/68 (BP Location: Left Wrist)   Pulse 93   Temp 98.5 F (36.9 C) (Axillary)   Resp 20   Ht '4\' 9"'$  (1.448 m)   Wt 82.4 kg   SpO2 96%   BMI 39.31 kg/m  Has been alert and interacting with family.  Moving extremities Improving daily now

## 2022-04-24 ENCOUNTER — Inpatient Hospital Stay (HOSPITAL_COMMUNITY): Payer: Medicare Other

## 2022-04-24 DIAGNOSIS — R569 Unspecified convulsions: Secondary | ICD-10-CM | POA: Diagnosis not present

## 2022-04-24 LAB — CBC WITH DIFFERENTIAL/PLATELET
Abs Immature Granulocytes: 0.37 10*3/uL — ABNORMAL HIGH (ref 0.00–0.07)
Basophils Absolute: 0.1 10*3/uL (ref 0.0–0.1)
Basophils Relative: 1 %
Eosinophils Absolute: 0.1 10*3/uL (ref 0.0–0.5)
Eosinophils Relative: 1 %
HCT: 28.5 % — ABNORMAL LOW (ref 36.0–46.0)
Hemoglobin: 8.7 g/dL — ABNORMAL LOW (ref 12.0–15.0)
Immature Granulocytes: 4 %
Lymphocytes Relative: 12 %
Lymphs Abs: 1.2 10*3/uL (ref 0.7–4.0)
MCH: 27.5 pg (ref 26.0–34.0)
MCHC: 30.5 g/dL (ref 30.0–36.0)
MCV: 90.2 fL (ref 80.0–100.0)
Monocytes Absolute: 1.2 10*3/uL — ABNORMAL HIGH (ref 0.1–1.0)
Monocytes Relative: 11 %
Neutro Abs: 7.6 10*3/uL (ref 1.7–7.7)
Neutrophils Relative %: 71 %
Platelets: 251 10*3/uL (ref 150–400)
RBC: 3.16 MIL/uL — ABNORMAL LOW (ref 3.87–5.11)
RDW: 17.3 % — ABNORMAL HIGH (ref 11.5–15.5)
WBC: 10.6 10*3/uL — ABNORMAL HIGH (ref 4.0–10.5)
nRBC: 0 % (ref 0.0–0.2)

## 2022-04-24 LAB — BASIC METABOLIC PANEL
Anion gap: 8 (ref 5–15)
BUN: 22 mg/dL (ref 8–23)
CO2: 27 mmol/L (ref 22–32)
Calcium: 8.4 mg/dL — ABNORMAL LOW (ref 8.9–10.3)
Chloride: 111 mmol/L (ref 98–111)
Creatinine, Ser: 0.87 mg/dL (ref 0.44–1.00)
GFR, Estimated: >60 mL/min (ref >60)
Glucose, Bld: 156 mg/dL — ABNORMAL HIGH (ref 70–99)
Potassium: 3.4 mmol/L — ABNORMAL LOW (ref 3.5–5.1)
Sodium: 146 mmol/L — ABNORMAL HIGH (ref 135–145)

## 2022-04-24 LAB — GLUCOSE, CAPILLARY
Glucose-Capillary: 148 mg/dL — ABNORMAL HIGH (ref 70–99)
Glucose-Capillary: 146 mg/dL — ABNORMAL HIGH (ref 70–99)
Glucose-Capillary: 167 mg/dL — ABNORMAL HIGH (ref 70–99)
Glucose-Capillary: 166 mg/dL — ABNORMAL HIGH (ref 70–99)

## 2022-04-24 MED ORDER — MIDODRINE HCL 5 MG PO TABS
5.0000 mg | ORAL_TABLET | Freq: Two times a day (BID) | ORAL | Status: DC
  Administered 2022-04-24 – 2022-05-06 (×24): 5 mg
  Filled 2022-04-24 (×22): qty 1

## 2022-04-24 MED ORDER — POTASSIUM CHLORIDE 20 MEQ PO PACK
20.0000 meq | PACK | ORAL | Status: AC
  Administered 2022-04-24 (×3): 20 meq
  Filled 2022-04-24 (×3): qty 1

## 2022-04-24 NOTE — Progress Notes (Signed)
NAME:  Tara [name], MRN:  [id number], DOB:  [birth year], LOS: 20 ADMISSION DATE:  03/15/2022, CONSULTATION DATE:  04/24/22 REFERRING MD:  Christella Noa, CHIEF COMPLAINT:  Meningioma, Seizure   History of Present Illness:  Tara [name] is a 76 y.o. F with PMH significant for Anxiety, Depression, T2DM, HTN, HL, cardiomyopathy, Atrial Fibrillation on Eliquis, CKD IIIb, breast Ca s/p chemoradiation in 2008 who presented to the ED 6/24 with new onset seizures.   She had a known planum sphenoidal meningioma that was increased in size on CT with surrounding edema, so was [facility] for [facility] evaluation.  LTM showed no further seizures.  She was evaluated by neurosurgery and underwent bifrontal craniotomy for meningioma resection on 7/3 and was transferred to intensive care post-op with PCCM consult while in ICU.   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   Significant Hospital Events: Including procedures, antibiotic start and stop dates in addition to other pertinent events   6/24 presented to [facility] ED with new onset seizures, CTH with enlarging meningioma with edema, loaded with Keppra 7/3 Underwent bifrontal craniotomy for meningioma, monitor in ICU 7/5 stable, no  further seizure activity 7/6 less responsive, two episodes of vomiting overnight and increased oral secretions today.  Continuous blinking on exam. CTH yesterday with new 7 mm thick subdural hematoma at the foramen magnum and tracking adjacent to the basion and odontoid not currently causing critical stenosis of the foramen magnum per radiology read, but merits surveillance. 7/7 Better night and more awake and responsive, repeat head CT unchanged and no seizures on video EEG 7/9 Recurrent seizure overnight requiring '4mg'$  of IV ativan to break. This am less responsive with forced right gaze  7/10 no further seizure activity, awake, responding, no forced gaze 7/11 recurrent sz like activity. Not correlated on EEG. CT unchanged.  7/21 reconsulted for hypoxia and bilateral infiltrates 7/24 Continued increasing oxygen needs. CT Angio Chest >> No PE's, Multifocal ground glass opacity within both lungs, likely pulmonary edema, small right pleural effusion , Calcified granuloma in the right lung with calcified right hilar adenopathy, likely a sequela of remote granulomatous infection. Interim History / Subjective:  Currently on 5 L nasal cannula sats 97% need to continue to wean.  No significant change in neurological status  Objective   Blood pressure 112/68, pulse 92, temperature 98.5 F (36.9 C), temperature source Oral, resp. rate 16, height '4\' 9"'$  (1.448 m), weight 72.9 kg, SpO2 91 %.        Intake/Output Summary (Last 24 hours) at 04/24/2022 0809 Last data filed at 04/24/2022 0700 Gross per 24 hour  Intake 4923.33 ml  Output 900 ml  Net 4023.33 ml   Filed Weights   04/22/22 0500 04/23/22 0500 04/24/22 0500  Weight: 82.4 kg 82.4 kg 72.9 kg   Physical Exam: General: Elderly female poorly responsive HEENT: MM pink/moist tube feeding via right nare Neuro: Does not follow verbal commands.  Disconjugate gaze.  Poorly responsive CV: Heart sounds are regular PULM: Diminished breath sounds throughout  with small amount of rhonchi   GI: soft, bsx4 active  GU: Amber urine Extremities: warm/dry, 1-2+ edema  Skin: no rashes or lesions, left foot with soft dressing   Resolved Hospital Problem list   AKI  Assessment & Plan:   Acute respiratory failure with hypoxia Probable aspiration Dysphagia  -Doubt amiodarone toxicity -CTA negative for PE and + for bilateral GG infiltrates and pulmonary edema.  P Decreased level of consciousness remains a concern for possible aspiration Aspiration precautions Enteral feeds Continue antimicrobial therapy Wean FiO2 for sats greater than 88% she is currently on 5 L with sats of 97% Pulmonary toilet Pulmonary will follow at a distance Check portable chest x-ray 04/24/2022   Sphenoidal meningioma with associated vasogenic edema and seizure, now s/p bifrontal craniectomy New CVAs MRI 04/15/2022 Eliquis stopped due to drop in HGB and facial bruise -Op date 7/21  -post op had seizure like activity but none proven on eeg  P Continue seizure precautions. Continue antiepileptics Neurology follow-up is appreciated Postop care per neurosurgery continue to monitor for neurological improvement.     [place] Palliative care is on board Remains full code Family has declined tracheostomy but will allow short-term intubation if needed.  y  Remains a full code.  Pulmonary critical care will follow intermittently.  Best Practice (right click and "Reselect all SmartList Selections" daily)   Diet/type: tubefeeds  DVT prophylaxis: PAS GI prophylaxis: PPI Lines: N/A Foley:  N/A Code Status:  full code Last date of multidisciplinary goals of care discussion: 7/24  Critical care: NA   Richardson Landry Faelyn Sigler ACNP Acute Care Nurse Practitioner [place] Please consult Amion 04/24/2022, 8:09 AM

## 2022-04-24 NOTE — Progress Notes (Signed)
Patient ID: Tara [name], female   DOB: Feb 04, 1946, 76 y.o.   MRN: [id number] BP 105/68 (BP Location: Left Wrist)   Pulse 84   Temp 98.3 F (36.8 C) (Oral)   Resp 17   Ht '4\' 9"'$  (1.448 m)   Wt 72.9 kg   SpO2 97%   BMI 34.78 kg/m  Has spoken to husband, was standing at the bedside with assistance Continuing to improve Wound is clean,and dry Moving all extremities

## 2022-04-24 NOTE — Progress Notes (Signed)
PROGRESS NOTE    Tara [name]  UKG:[id number] DOB: 1945-10-13 DOA: 03/15/2022 PCP: [name], MD    Brief Narrative:   Tara [name] is a 76 y.o. female with past medical history significant for anxiety/depression, type 2 diabetes mellitus, hyperlipidemia, paroxysmal atrial fibrillation on Eliquis, essential hypertension, nonischemic cardiomyopathy, CKD stage IIIb, breast cancer s/p chemoradiation, history of sphenoidal meningioma who presented to [facility] ED on 6/24 with new onset seizures.    Patient recently discharged on 03/12/2022 after she presented for weakness, fatigue, confusion and slurred speech.  She was initially admitted to the intensive care unit with acute renal failure, electrolyte disturbance and A-fib with RVR with hypotension.  She was started on amiodarone infusion at that time and CT head during that admission showed interval increase in the size of her previously noted meningioma.  Patient was hydrated with improvement of renal function and discharged home with Decadron taper and follow-up with neurosurgery in 7-10 days.  Family reports that she never returned to her baseline following that admission.  In the ED, CT head with no acute process with stable sphenoidal meningioma measuring 2.4 x 3.3 x 2.8 cm with hypodense lesion in the frontal lobe medially to the adjacent mass representing edema/infarct.  Neurosurgery was consulted and patient was transferred to [facility] for further evaluation and management under the hospitalist service.  Significant Hospital Events: 6/24 presented to [facility] ED with new onset seizures, CTH with enlarging meningioma with edema, loaded with Keppra 7/3 Underwent bifrontal craniotomy for meningioma 7/6 CTH with new 7 mm thick subdural hematoma at the foramen magnum and tracking adjacent to the basion and odontoid not currently causing critical stenosis of the foramen magnum per radiology read, but merits surveillance. 7/11  recurrent sz like activity. Not correlated on EEG. CT unchanged.  7/13 neurology signed off. 7/21 palliative care consulted. 7/23 CT chest and abdomen was performed negative for PE or significant pneumonia, without any hemorrhage 7/25 MRI brain without contrast shows increasing mass effect, SDH as well as subacute/acute small scattered infarct. 7/26 tube feeds resumed, neurology reconsulted.  Eliquis resumed per neurosurgery 7/27 oxygenation improving, mentation improving, neurology following.  CTA head and neck without any LVO.  Lower extremity Doppler without any DVT.  Echo shows new systolic dysfunction with global hypokinesis EF 40 to 45% and significant RV failure. 7/29 oxygenation improving to 6 L saturating 94%. 7/30 had another aspiration event. Tube feeds on hold. Now on NRB. Remains full code. No tracheostomy per husband. Per Neurosurgery, give at least two weeks if not more to see if she improves.  8/1 oxygenation improving again to 5 L.  Trickle tube feeds started.  Zofran and PPI scheduled twice daily. 8/2: No significant change, husband reports patient responding occasionally to verbal stimuli  Assessment & Plan:   Sphenoidal meningioma with vasogenic edema Seizure Subdural hemorrhage along [place] Subarachnoid hemorrhage and left occipital horn without hydrocephalus Narrowing foramen magnum due to SDH Patient presenting as a transfer from [facility] following tonic-clonic seizure at home.  Patient with known history of meningioma with interval increase.  Neurosurgery was consulted and patient underwent bifrontal craniotomy for meningioma resection on 03/24/2022.  MRI brain 7/25 with evidence of SDH and SAH.  Neurology was initially consulted and patient underwent multiple EEGs and LTM while inpatient, last EEG 7/28 negative for any acute seizures. --Palliative care following, appreciate assistance --Apixaban resumed 7/28 --Vimpat 200 mg per tube twice daily --Keppra 1000  mg per tube twice  daily --Per neurosurgery, will at least 2 weeks from 7/30 to assess improvement --Further management per neurosurgery  Sepsis in the setting of HCAP and recurrent aspiration pneumonia Acute hypoxic respiratory failure Developed fever of 100.9 on 7/23, COVID-19 PCR negative.  Chest x-ray with new hazy opacities projecting over both upper lobes in the suprahilar regions consistent with developing infection.  CT chest, abdomen/pelvis with no PE, multifocal groundglass opacities likely pulmonary edema. --MRSA PCR negative. 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  Repeat TTE 04/17/2022 with EF 40-45% with global hypokinesis, RV function also moderately low. --net negative 3.9L since admission --wt 85>>>72.9kg --Strict I's and O's Daily weights --Titrate oxygen to maintain SPO2 >92%; on 5L [place] w/ SpO2 98% at rest --Intermittent diuresis with IV Lasix  Acute embolic CVA, incidental Patient with known history of atrial fibrillation, MRI brain with evidence of restricted effusion left cerebellum, bilateral occipital lobe, left parietal lobe, bilateral frontal lobe; concerning for small scattered acute and subacute infarct most likely embolic in the setting of history of A-fib.  Repeat TTE showed mildly degraded LVEF of 40-45%.  Eliquis was initially held due to/SAH and need for meningioma resection as above but now has been resumed.  LDL 106 April 2023.  Neurology was initially consulted but now signed off. --SLP, remain n.p.o.  with core track in place and tube feeds initiated --Continue PT/OT efforts while inpatient, currently recommending SNF --Crestor 20 mg per tube daily  Postoperative acute blood loss anemia, expected Iron deficiency anemia Acute on chronic thrombocytopenia Patient was transfused 2 units of PRBCs on 7/22 for hemoglobin drop of 6.8.  Anemia panel with iron 15, sats 8, ferritin 440, vitamin B12 356. --Hgb  9.3>>>6.8>>7.7>8.3>8.7 --Plt 155>>>85>>81>>>257>254>251 --Continue intermittent monitoring of CBC  Dysphagia with recurrent aspiration Remains n.p.o. per recommendations of speech therapy.  Core track placed on 03/28/2022. --Continue tube feeds --If no further improvement, will need to consider PEG/G-tube placement if goals of care remain aggressive  Hypokalemia Potassium 3.4 today, will replete --Repeat BMP in the a.m. with magnesium level  Hyponatremia: Resolved --BMP daily  Anxiety/depression --Sertraline 25 mg per tube daily  Type 2 diabetes mellitus Hemogram A1C 5.9, well controlled. --SSI for coverage  Paroxysmal atrial fibrillation --Amiodarone 20 mg per tube daily --Apixaban 5 mg per tube twice daily  History of essential hypertension Now with hypotension --Midodrine 5 mg per tube BID  GERD: Protonix 40 mg per tube daily  Pressure ulcer left buttock stage II, not POA Pressure Injury 04/07/22 Buttocks Left Stage 2 -  Partial thickness loss of dermis presenting as a shallow open injury with a red, pink wound bed without slough. (Active)  04/07/22 0100  Location: Buttocks  Location Orientation: Left  Staging: Stage 2 -  Partial thickness loss of dermis presenting as a shallow open injury with a red, pink wound bed without slough.  Wound Description (Comments):   Present on Admission: No  --Continue wound care, frequent turning/offloading  Obesity Body mass index is 34.78 kg/m.  Discussed with patient needs for aggressive lifestyle changes/weight loss as this  complicates all facets of care.  Outpatient follow-up  with PCP.    DVT prophylaxis: SCDs Start: 03/26/22 0840 Place TED hose Start: 03/24/22 0616 Place and maintain sequential compression device Start: 03/23/22 0631 apixaban (ELIQUIS) tablet 5 mg    Code Status: Full Code Family Communication:   Disposition Plan:  Level of care: Progressive Status is: Inpatient Remains inpatient appropriate because: Per primary, neurosurgery, PT/OT currently recommending SNF placement     Procedures:  EEG 7/2, 7/7, 7/8, 7/11 Bifrontal craniotomy for resection of meningioma, Dr. Christella Noa 7/3  Antimicrobials:  Vancomycin 7/19 - 7/20 Cefepime 7/19 - 7/21 Cefazolin 7/3 - 7/3 Unasyn 7/7 - 7/10, 7/21>>   Subjective: Patient seen examined bedside, sitting at edge of bed with therapist.  Husband present at bedside.  Currently sitting at edge of bed and attempting standing.  Much more alert and conversant today.  Husband reports patient talk to her son over the telephone yesterday.  Patient with no complaints this morning.  No acute concerns overnight per nursing staff.  Objective: Vitals:   04/23/22 2330 04/24/22 0330 04/24/22 0500 04/24/22 0715  BP: 92/61 (!) 90/57  112/68  Pulse: 88 81  92  Resp: '17 16  16  '$ Temp: 98.4 F (36.9 C) 98.5 F (36.9 C)  98.5 F (36.9 C)  TempSrc: Axillary Axillary  Oral  SpO2: 96% 98%  91%  Weight:   72.9 kg   Height:        Intake/Output Summary (Last 24 hours) at 04/24/2022 1024 Last data filed at 04/24/2022 0700 Gross per 24 hour  Intake 4923.33 ml  Output 900 ml  Net 4023.33 ml   Filed Weights   04/22/22 0500 04/23/22 0500 04/24/22 0500  Weight: 82.4 kg 82.4 kg 72.9 kg    Examination:  Physical Exam: GEN: Chronically ill in appearance, alert, NAD HEENT: Frontal craniotomy incision site noted, dry mucous membranes, eyes closed PULM: Decreased breath sounds bilateral bases, no crackles/wheezing, normal respiratory effort on 5 L nasal cannula with SPO2  98% at rest CV: RRR w/o M/G/R GI: abd soft, NTND, NABS, no R/G/M MSK: No peripheral edema    Data Reviewed: I have personally reviewed following labs and imaging studies  CBC: Recent Labs  Lab 04/20/22 0225 04/21/22 0157 04/22/22 0636 04/23/22 0607 04/24/22 0159  WBC 8.6 12.5* 9.8 9.2 10.6*  NEUTROABS 6.3 10.0* 7.3 6.7 7.6  HGB 9.3* 9.7* 9.7* 9.3* 8.7*  HCT 29.6* 31.0* 31.7* 30.7* 28.5*  MCV 88.1 87.6 89.5 90.0 90.2  PLT 217 236 257 254 694   Basic Metabolic Panel: Recent Labs  Lab 04/19/22 2003 04/20/22 1141 04/21/22 0157 04/22/22 0636 04/23/22 0607 04/24/22 0159  NA 147* 146* 147* 145 148* 146*  K 3.4* 3.3* 3.5 3.2* 3.5 3.4*  CL 105 109 106 106 112* 111  CO2 32 '29 31 29 28 27  '$ GLUCOSE 125* 146* 95 93 151* 156*  BUN 35* 35* 35* 27* 23 22  CREATININE 1.07* 0.99 1.03* 0.99 0.89 0.87  CALCIUM 8.6* 8.7* 8.9 8.7* 8.6* 8.4*  MG 2.2  --  2.1 2.2 2.1  --    GFR: Estimated Creatinine Clearance: 45.4 mL/min (by C-G formula based on SCr of 0.87 mg/dL). Liver Function Tests: Recent Labs  Lab 04/22/22 0636  AST 39  ALT 40  ALKPHOS 78  BILITOT 0.7  PROT 6.3*  ALBUMIN 1.9*   No results for input(s): "LIPASE", "AMYLASE" in the last 168 hours. No results for input(s): "AMMONIA" in the last 168 hours. Coagulation Profile: No results for input(s): "INR", "PROTIME" in the  last 168 hours. Cardiac Enzymes: No results for input(s): "CKTOTAL", "CKMB", "CKMBINDEX", "TROPONINI" in the last 168 hours. BNP (last 3 results) No results for input(s): "PROBNP" in the last 8760 hours. HbA1C: No results for input(s): "HGBA1C" in the last 72 hours. CBG: Recent Labs  Lab 04/23/22 0604 04/23/22 1155 04/23/22 1651 04/23/22 2331 04/24/22 0609  GLUCAP 145* 171* 141* 180* 148*   Lipid Profile: No results for input(s): "CHOL", "HDL", "LDLCALC", "TRIG", "CHOLHDL", "LDLDIRECT" in the last 72 hours. Thyroid Function Tests: No results for input(s): "TSH", "T4TOTAL", "FREET4",  "T3FREE", "THYROIDAB" in the last 72 hours. Anemia Panel: No results for input(s): "VITAMINB12", "FOLATE", "FERRITIN", "TIBC", "IRON", "RETICCTPCT" in the last 72 hours. Sepsis Labs: No results for input(s): "PROCALCITON", "LATICACIDVEN" in the last 168 hours.  Recent Results (from the past 240 hour(s))  SARS Coronavirus 2 by RT PCR (hospital order, performed in [facility] hospital lab) *cepheid single result test* Anterior Nasal Swab     Status: None   Collection Time: 04/14/22  1:12 PM   Specimen: Anterior Nasal Swab  Result Value Ref Range Status   SARS Coronavirus 2 by RT PCR NEGATIVE NEGATIVE Final    Comment: (NOTE) SARS-CoV-2 target nucleic acids are NOT DETECTED.  The SARS-CoV-2 RNA is generally detectable in upper and lower respiratory specimens during the acute phase of infection. The lowest concentration of SARS-CoV-2 viral copies this assay can detect is 250 copies / mL. A negative result does not preclude SARS-CoV-2 infection and should not be used as the sole basis for treatment or other patient management decisions.  A negative result may occur with improper specimen collection / handling, submission of specimen other than nasopharyngeal swab, presence of viral mutation(s) within the areas targeted by this assay, and inadequate number of viral copies (<250 copies / mL). A negative result must be combined with clinical observations, patient history, and epidemiological information.  Fact Sheet for Patients:   https://www.patel.info/  Fact Sheet for Healthcare Providers: https://hall.com/  This test is not yet approved or  cleared by the [place] FDA and has been authorized for detection and/or diagnosis of SARS-CoV-2 by FDA under an Emergency Use Authorization (EUA).  This EUA will remain in effect (meaning this test can be used) for the duration of the COVID-19 declaration under Section 564(b)(1) of the Act, 21  U.S.C. section 360bbb-3(b)(1), unless the authorization is terminated or revoked sooner.  Performed at [facility], [place] [address], [place], [place] [address]          Radiology Studies: DG CHEST PORT 1 VIEW  Result Date: 04/24/2022 CLINICAL DATA:  Provided history: Respiratory abnormalities. History of CHF, hypertension, nonsmoker. EXAM: PORTABLE CHEST 1 VIEW COMPARISON:  Prior chest radiographs 04/20/2022 and earlier. FINDINGS: An enteric tube passes below the level of left hemidiaphragm with tip excluded from the field of view. Cardiomegaly. Aortic atherosclerosis. Redemonstrated calcified mediastinal/hilar lymph nodes, likely reflecting sequela of prior granulomatous disease. Bilateral interstitial opacities and ill-defined airspace opacities, progressed from the prior chest radiograph 04/20/2022. No evidence of pleural effusion or pneumothorax. No acute bony abnormality identified. Surgical clips within the right axilla. IMPRESSION: Progressive bilateral interstitial opacities and ill-defined airspace opacities, which may reflect pulmonary edema and/or infection. Cardiomegaly. Aortic Atherosclerosis (ICD10-I70.0). Electronically Signed   By: Kellie Simmering D.O.   On: 04/24/2022 08:52        Scheduled Meds:  amiodarone  200 mg Per Tube Daily   apixaban  5 mg Per Tube BID   budesonide (PULMICORT)  nebulizer solution  0.25 mg Nebulization BID   Chlorhexidine Gluconate Cloth  6 each Topical Daily   feeding supplement (PROSource TF)  45 mL Per Tube TID   free water  300 mL Per Tube Q4H   insulin aspart  0-15 Units Subcutaneous Q6H   lacosamide  200 mg Per Tube BID   levETIRAcetam  1,000 mg Per Tube BID   midodrine  5 mg Per Tube BID WC   montelukast  10 mg Per Tube QHS   multivitamin with minerals  1 tablet Per Tube Daily   ondansetron  4 mg Per Tube Q12H   mouth rinse  15 mL Mouth Rinse 4 times per day   pantoprazole sodium  40 mg Per Tube BID   polyethylene glycol  17 g Per  NG tube Daily   potassium chloride  20 mEq Per Tube Q3H   rosuvastatin  20 mg Per Tube Daily   senna  1 tablet Per Tube BID   sertraline  25 mg Per Tube Daily   Continuous Infusions:  ampicillin-sulbactam (UNASYN) IV 3 g (04/24/22 0810)   feeding supplement (OSMOLITE 1.5 CAL) 40 mL/hr at 04/24/22 0700     LOS: 40 days    Time spent: 49 minutes spent on chart review, discussion with nursing staff, consultants, updating family and interview/physical exam; more than 50% of that time was spent in counseling and/or coordination of care.    Blair Lundeen J [place] DO [facility] Available via Epic secure chat 7am-7pm After these hours, please refer to coverage provider listed on [email or web address] 04/24/2022, 10:24 AM

## 2022-04-24 NOTE — Progress Notes (Signed)
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 s/p tumor resection 7/3. Recent DC from [facility] 6/22. MRI on 7/26 shows: SDH extending inferiorly along the clivus and  narrowing the foramen magnum with possible mass effect. PMH: CKD stage 3, HFpEF, a fib, meningioma.   OT comments  Pt progressing towards established OT goals and demonstrating increased engagement this session. Once sitting at EOB, pt opening her eyes and maintaining them open throughout session. Pt participating in minimal answering of questions and making eye contact. Pt participating in oral care with initial Max A for hand over hand and then Min A. Pt performing sit<>stand with Mod A +2 and squat pivot to recliner with Total A +2. Continue to recommend dc to SNF and will continue to follow acutely as admitted.    Recommendations for follow up therapy are one component of a multi-disciplinary discharge planning process, led by the attending physician.  Recommendations may be updated based on patient status, additional functional criteria and insurance authorization.    Follow Up Recommendations  Skilled nursing-short term rehab (<3 hours/day)    Assistance Recommended at Discharge Frequent or constant Supervision/Assistance  Patient can return home with the following  Two people to help with walking and/or transfers;Two people to help with bathing/dressing/bathroom;Assistance with cooking/housework;Assistance with feeding;Direct supervision/assist for medications management;Direct supervision/assist for financial management;Assist for transportation;Help with stairs or ramp for entrance   Equipment Recommendations  Other (comment)    Recommendations for Other Services Other (comment)    Precautions / Restrictions Precautions Precautions:  Fall Precaution Comments: seizures, multiple lines Restrictions Weight Bearing Restrictions: No       Mobility Bed Mobility Overal bed mobility: Needs Assistance Bed Mobility: Rolling, Sidelying to Sit Rolling: Total assist Sidelying to sit: Total assist       General bed mobility comments: pt with no attempt to assist with transition to sitting. initially maxA to maintain static sitting but improved with improved alertness    Transfers Overall transfer level: Needs assistance Equipment used: 2 person hand held assist Transfers: Sit to/from Stand, Bed to chair/wheelchair/BSC Sit to Stand: Max assist, +2 physical assistance   Squat pivot transfers: Total assist, +2 physical assistance       General transfer comment: completed sit-stand x2 with maxA of 2 and use of bed pad to assist with hip extension. pt with poor postural stength and remains dependent on therapists. totalA to pivot to recliner     Balance Overall balance assessment: Needs assistance Sitting-balance support: Feet supported Sitting balance-Leahy Scale: Poor Sitting balance - Comments: initially dependent on assist, progressed to moments of minG with continued activity sitting EOB Postural control: Posterior lean, Left lateral lean, Right lateral lean Standing balance support: Bilateral upper extremity supported, During functional activity Standing balance-Leahy Scale: Zero Standing balance comment: dependent on +2 assist to rise and maintain standing                           ADL either performed or assessed with clinical judgement   ADL Overall ADL's : Needs assistance/impaired     Grooming: Oral care;Moderate assistance;Sitting Grooming Details (indicate cue type and reason): Pt participating in oral care with initial Max hand over hand for back and forth motition. Pt achieving brushing motion with min A. Sitting EOB with MIn A from second person  Toilet Transfer:  Total assistance Toilet Transfer Details (indicate cue type and reason): squat pivot to recliner         Functional mobility during ADLs: Total assistance;+2 for physical assistance General ADL Comments: Pt demonstrating increased engagment to sit EOB, participate in oral care, and then squat pivot to recliner    Extremity/Trunk Assessment Upper Extremity Assessment Upper Extremity Assessment: RUE deficits/detail;LUE deficits/detail RUE Deficits / Details: Tendency for internal rotation and flexion. About to achieve PROM and into extension. RUE Coordination: decreased fine motor;decreased gross motor LUE Deficits / Details: Minimal active/purposeful movement; but feel this is more due to cognition and attention LUE Coordination: decreased fine motor;decreased gross motor   Lower Extremity Assessment Lower Extremity Assessment: Defer to PT evaluation        Vision   Vision Assessment?: Vision impaired- to be further tested in functional context Additional Comments: Gaze R. Making eye contact but difficulty sustaining   Perception     Praxis      Cognition Arousal/Alertness: Lethargic, Awake/alert Behavior During Therapy: Flat affect Overall Cognitive Status: Impaired/Different from baseline Area of Impairment: Orientation, Attention, Memory, Following commands, Safety/judgement, Problem solving, Awareness                 Orientation Level:  (Not answering orientation questions) Current Attention Level: Focused, Sustained Memory: Decreased short-term memory, Decreased recall of precautions Following Commands: Follows one step commands inconsistently, Follows one step commands with increased time Safety/Judgement: Decreased awareness of safety, Decreased awareness of deficits Awareness: Intellectual Problem Solving: Slow processing, Difficulty sequencing, Requires verbal cues, Requires tactile cues General Comments: Initially lethargic and sleepy. Once at EOB, pt  opening her eyes and minimicing therapist's two word sentences including "good morning" and "hi Glorianne". Then pt progressing to answering simple questions such as "how are you" responding "I am going fine." Engaging in oral care while sitting EOB and following simple commands with increased cues and time inconsistently.        Exercises      Shoulder Instructions       General Comments SpO2 to low of 83% on 5L with transition to sitting, increased O2 to 8L, and pt gradually recovered to 97%, then decreased to 6L and pt tolerated well with SpO2 >91%. Husband present throughout    Pertinent Vitals/ Pain       Pain Assessment Pain Assessment: Faces Faces Pain Scale: No hurt Pain Intervention(s): Monitored during session  Home Living                                          Prior Functioning/Environment              Frequency  Min 2X/week        Progress Toward Goals  OT Goals(current goals can now be found in the care plan section)  Progress towards OT goals: Progressing toward goals  Acute Rehab OT Goals OT Goal Formulation: Patient unable to participate in goal setting Time For Goal Achievement: 04/30/22 Potential to Achieve Goals: Fair ADL Goals Pt Will Perform Grooming: with max assist;sitting Pt Will Perform Upper Body Bathing: with max assist;sitting Pt Will Perform Lower Body Bathing: with max assist;bed level Pt Will Perform Lower Body Dressing: with modified independence;sitting/lateral leans;sit to/from stand Pt Will Transfer to Toilet: with max assist;with +2 assist;bedside commode Pt Will Perform Toileting - Clothing Manipulation and hygiene: with modified independence;sitting/lateral leans;sit  to/from stand Additional ADL Goal #1: Pt will track to midline to find family members with moderate directional cues and max assist to turn head  Plan Discharge plan remains appropriate;Frequency remains appropriate    Co-evaluation     PT/OT/SLP Co-Evaluation/Treatment: Yes Reason for Co-Treatment: Complexity of the patient's impairments (multi-system involvement);Necessary to address cognition/behavior during functional activity;For patient/therapist safety;To address functional/ADL transfers PT goals addressed during session: Mobility/safety with mobility;Balance;Strengthening/ROM OT goals addressed during session: ADL's and self-care      AM-PAC OT "6 Clicks" Daily Activity     Outcome Measure   Help from another person eating meals?: Total Help from another person taking care of personal grooming?: A Lot Help from another person toileting, which includes using toliet, bedpan, or urinal?: Total Help from another person bathing (including washing, rinsing, drying)?: Total Help from another person to put on and taking off regular upper body clothing?: Total Help from another person to put on and taking off regular lower body clothing?: Total 6 Click Score: 7    End of Session Equipment Utilized During Treatment: Oxygen;Gait belt  OT Visit Diagnosis: Unsteadiness on feet (R26.81);Other abnormalities of gait and mobility (R26.89);Muscle weakness (generalized) (M62.81);Low vision, both eyes (H54.2);Other symptoms and signs involving cognitive function;Pain   Activity Tolerance Patient tolerated treatment well   Patient Left in chair;with call bell/phone within reach;with chair alarm set;with family/visitor present   Nurse Communication Mobility status        Time: 4103-0131 OT Time Calculation (min): 34 min  Charges: OT General Charges $OT Visit: 1 Visit OT Treatments $Self Care/Home Management : 8-22 mins  Gustav Knueppel MSOT, OTR/L Acute Rehab Office: [place] 04/24/2022, 10:47 AM

## 2022-04-24 NOTE — Progress Notes (Signed)
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 s/p tumor resection 7/3. Recent DC from [facility] 6/22. MRI on 7/26 shows: SDH extending inferiorly along the clivus and  narrowing the foramen magnum with possible mass effect. PMH: CKD stage 3, HFpEF, a fib, meningioma.    PT Comments    The pt was received in bed with spouse present and eager for pt to participate in session. With change in position to sitting EOB and increased stimulation, pt with improved arousal and was gradually able to progress from maxA to maintain static sitting to moments of minG. The pt then completed x2 sit-stand transfers from EOB with maxA of 2, blocking of bilateral knees, and use of bed pad to facilitate hip extension and rise. The pt had drop in SpO2 to low of 84% on 5L with initial transition to sitting, but recovered slowly to 97% with increase to 8L. Once recovered, the pt was able to tolerate wean back to 6L with SpO2 >91%. Will continue to benefit from skilled PT to improve arousal, strength, coordination, and activity tolerance.     Recommendations for follow up therapy are one component of a multi-disciplinary discharge planning process, led by the attending physician.  Recommendations may be updated based on patient status, additional functional criteria and insurance authorization.  Follow Up Recommendations  Skilled nursing-short term rehab (<3 hours/day) Can patient physically be transported by private vehicle: No   Assistance Recommended at Discharge Frequent or constant Supervision/Assistance  Patient can return home with the following Assistance with cooking/housework;Assist for transportation;Direct supervision/assist for financial management;Two people to help with walking and/or transfers;Two people to help with  bathing/dressing/bathroom;Help with stairs or ramp for entrance   Equipment Recommendations  Wheelchair (measurements PT);Wheelchair cushion (measurements PT);Hospital bed;Other (comment)    Recommendations for Other Services       Precautions / Restrictions Precautions Precautions: Fall Precaution Comments: seizures, multiple lines Restrictions Weight Bearing Restrictions: No     Mobility  Bed Mobility Overal bed mobility: Needs Assistance Bed Mobility: Rolling, Sidelying to Sit Rolling: Total assist Sidelying to sit: Total assist       General bed mobility comments: pt with no attempt to assist with transition to sitting. initially maxA to maintain static sitting but improved with improved alertness    Transfers Overall transfer level: Needs assistance Equipment used: 2 person hand held assist Transfers: Sit to/from Stand, Bed to chair/wheelchair/BSC Sit to Stand: Max assist, +2 physical assistance     Squat pivot transfers: Total assist, +2 physical assistance     General transfer comment: completed sit-stand x2 with maxA of 2 and use of bed pad to assist with hip extension. pt with poor postural stength and remains dependent on therapists. totalA to pivot to recliner    Ambulation/Gait               General Gait Details: unable to manage, total A to maintain static stance    Modified Rankin (Stroke Patients Only) Modified Rankin (Stroke Patients Only) Pre-Morbid Rankin Score: Moderately severe disability Modified Rankin: Severe disability     Balance Overall balance assessment: Needs assistance Sitting-balance support: Feet supported Sitting balance-Leahy Scale: Poor Sitting balance - Comments: initially dependent on assist, progressed to moments of minG with continued activity sitting EOB Postural control: Posterior lean, Left lateral lean, Right lateral lean Standing balance support: Bilateral upper  extremity supported, During functional  activity Standing balance-Leahy Scale: Zero Standing balance comment: dependent on +2 assist to rise and maintain standing                            Cognition Arousal/Alertness: Lethargic Behavior During Therapy: Flat affect Overall Cognitive Status: Impaired/Different from baseline                                 General Comments: not formally tested, pt attempting to follow commands with increased time, effort, and multimodal cues. Pt with improved arousal and participation through session, progressing to answering multiple simple questions with one-word answers. Maintains eyes opened        Exercises      General Comments General comments (skin integrity, edema, etc.): SpO2 to low of 83% on 5L with transition to sitting, increased O2 to 8L, and pt gradually recovered to 97%, then decreased to 6L and pt tolerated well aith SpO2 >91%      Pertinent Vitals/Pain Pain Assessment Pain Assessment: Faces Faces Pain Scale: No hurt Breathing: normal Negative Vocalization: none Facial Expression: smiling or inexpressive Body Language: relaxed Consolability: no need to console PAINAD Score: 0 Pain Intervention(s): Monitored during session     PT Goals (current goals can now be found in the care plan section) Acute Rehab PT Goals Patient Stated Goal: per family, to return to independence, plan to wait 2 weeks to see improvement PT Goal Formulation: With family Time For Goal Achievement: 04/30/22 Potential to Achieve Goals: Fair Progress towards PT goals: Progressing toward goals    Frequency    Min 3X/week      PT Plan Current plan remains appropriate    Co-evaluation PT/OT/SLP Co-Evaluation/Treatment: Yes Reason for Co-Treatment: Complexity of the patient's impairments (multi-system involvement);For patient/therapist safety;To address functional/ADL transfers PT goals addressed during session: Mobility/safety with  mobility;Balance;Strengthening/ROM OT goals addressed during session: ADL's and self-care      AM-PAC PT "6 Clicks" Mobility   Outcome Measure  Help needed turning from your back to your side while in a flat bed without using bedrails?: Total Help needed moving from lying on your back to sitting on the side of a flat bed without using bedrails?: Total Help needed moving to and from a bed to a chair (including a wheelchair)?: Total Help needed standing up from a chair using your arms (e.g., wheelchair or bedside chair)?: Total Help needed to walk in hospital room?: Total Help needed climbing 3-5 steps with a railing? : Total 6 Click Score: 6    End of Session Equipment Utilized During Treatment: Gait belt;Oxygen Activity Tolerance: Patient tolerated treatment well Patient left: in chair;with call bell/phone within reach;with chair alarm set;with family/visitor present Nurse Communication: Mobility status;Need for lift equipment PT Visit Diagnosis: Other abnormalities of gait and mobility (R26.89);Other symptoms and signs involving the nervous system (H37.169)     Time: 6789-3810 PT Time Calculation (min) (ACUTE ONLY): 32 min  Charges:  $Therapeutic Exercise: 8-22 mins                     West Carbo, PT, DPT   Acute Rehabilitation Department   Sandra Cockayne 04/24/2022, 9:42 AM

## 2022-04-25 DIAGNOSIS — T17908D Unspecified foreign body in respiratory tract, part unspecified causing other injury, subsequent encounter: Secondary | ICD-10-CM | POA: Diagnosis not present

## 2022-04-25 DIAGNOSIS — Z515 Encounter for palliative care: Secondary | ICD-10-CM | POA: Diagnosis not present

## 2022-04-25 DIAGNOSIS — R569 Unspecified convulsions: Secondary | ICD-10-CM | POA: Diagnosis not present

## 2022-04-25 LAB — GLUCOSE, CAPILLARY
Glucose-Capillary: 160 mg/dL — ABNORMAL HIGH (ref 70–99)
Glucose-Capillary: 150 mg/dL — ABNORMAL HIGH (ref 70–99)
Glucose-Capillary: 142 mg/dL — ABNORMAL HIGH (ref 70–99)
Glucose-Capillary: 164 mg/dL — ABNORMAL HIGH (ref 70–99)

## 2022-04-25 LAB — BASIC METABOLIC PANEL
Anion gap: 8 (ref 5–15)
BUN: 20 mg/dL (ref 8–23)
CO2: 26 mmol/L (ref 22–32)
Calcium: 8.5 mg/dL — ABNORMAL LOW (ref 8.9–10.3)
Chloride: 109 mmol/L (ref 98–111)
Creatinine, Ser: 0.83 mg/dL (ref 0.44–1.00)
GFR, Estimated: >60 mL/min (ref >60)
Glucose, Bld: 155 mg/dL — ABNORMAL HIGH (ref 70–99)
Potassium: 4.3 mmol/L (ref 3.5–5.1)
Sodium: 143 mmol/L (ref 135–145)

## 2022-04-25 LAB — MAGNESIUM: Magnesium: 1.9 mg/dL (ref 1.7–2.4)

## 2022-04-25 MED ORDER — FUROSEMIDE 40 MG PO TABS
40.0000 mg | ORAL_TABLET | Freq: Every day | ORAL | Status: DC
Start: 2022-04-25 — End: 2022-05-06
  Administered 2022-04-25 – 2022-05-05 (×11): 40 mg
  Filled 2022-04-25 (×11): qty 1

## 2022-04-25 MED ORDER — ORAL CARE MOUTH RINSE: 15.0000 mL | OROMUCOSAL | Status: DC | PRN

## 2022-04-25 NOTE — Progress Notes (Signed)
Palliative Medicine Inpatient Follow Up Note  HPI: Patient is a 76-year-old African-American female currently with past medical history significant for but not limited to anxiety, depression, diabetes mellitus type 2, hypertension, hyperlipidemia, cardiomyopathy, history of atrial fibrillation on anticoagulation with Eliquis, chronic kidney disease stage IIIb, history of breast cancer status post chemoradiation in 2008 as well as other comorbidities who presented to the hospital on 03/15/2022 with new onset seizures.  She had a known sphenoidal meningioma that was noted to be increased in size on the CT scan with surrounding edema so she is transferred from [place] for further evaluation.  She underwent LTM with no further seizures and was evaluated by neurosurgery and underwent bifrontal craniotomy for meningioma resection on 03/24/2020. She has been minimally responsive since.   Has had a prolonged 36 day hospitalization.   Palliative care has been asked to get involved to discuss goals of care.   Today's Discussion 04/25/2022  *Please note that this is a verbal dictation therefore any spelling or grammatical errors are due to the "[employer]" system interpretation.  Chart reviewed inclusive of vital signs, progress notes, laboratory results, and diagnostic images.   I met with Tara [name] at bedside this morning. She was more awake and alert. She was not able to respond to me though was opening her eyes and moving her lips. No family was present at bedside.   Plan to continue with watchful waiting for improvements.   Objective Assessment: Vital Signs Vitals:   04/25/22 0310 04/25/22 0746  BP: 108/62 (!) 108/56  Pulse: 88 88  Resp: 17 19  Temp: 98 F (36.7 C) (!) 97.5 F (36.4 C)  SpO2: 94% 93%    Intake/Output Summary (Last 24 hours) at 04/25/2022 1109 Last data filed at 04/25/2022 0324 Gross per 24 hour  Intake --  Output 1000 ml  Net -1000 ml    Last Weight  Most recent  update: 04/25/2022  6:25 AM    Weight  78.6 kg (173 lb 4.5 oz)            Gen: Frail elderly African-American female HEENT: Suturing across forehead, core track in place, dry mucous membranes CV: Regular rate and rhythm, no murmurs rubs or gallops PULM: On 6LPM [place], breathing even and nonlabored ABD: soft/nontender EXT: Generalized edema Neuro: Eyes closed does not verbally respond nor follow any commands  SUMMARY OF RECOMMENDATIONS   Full Code/Full Scope of Care Allow time for outcomes Family hears from Dr. Cabbell that he believes she can still make a full recovery  GOC is to restore Tara [name] to a state that she can return home, if not - she would not wish to die in a hospital and family would accept hospice in the home if it came to this PMT will continue to follow incrementally  ______________________________________________________________________________________   [place] Palliative Medicine Team Team Cell Phone: [phone] Please utilize secure chat with additional questions, if there is no response within 30 minutes please call the above phone number  Palliative Medicine Team providers are available by phone from 7am to 7pm daily and can be reached through the team cell phone.  Should this patient require assistance outside of these hours, please call the patient's attending physician.     

## 2022-04-25 NOTE — Progress Notes (Signed)
Patient ID: Tara [name], female   DOB: 06-11-1946, 76 y.o.   MRN: [id number] BP (!) 108/56 (BP Location: Left Wrist)   Pulse 88   Temp (!) 97.5 F (36.4 C) (Oral)   Resp 19   Ht '4\' 9"'$  (1.448 m)   Wt 78.6 kg   SpO2 93%   BMI 37.50 kg/m  Slow improvement Will speak at times Moving all extremities Wound is clean and dry Continue current recommendations

## 2022-04-25 NOTE — Progress Notes (Signed)
PROGRESS NOTE    [name]  FBP:[id number] DOB: July 05, 1946 DOA: 03/15/2022 PCP: [name], MD    Brief Narrative:   Tara [name] is a 76 y.o. female with past medical history significant for anxiety/depression, type 2 diabetes mellitus, hyperlipidemia, paroxysmal atrial fibrillation on Eliquis, essential hypertension, nonischemic cardiomyopathy, CKD stage IIIb, breast cancer s/p chemoradiation, history of sphenoidal meningioma who presented to [facility] ED on 6/24 with new onset seizures.    Patient recently discharged on 03/12/2022 after she presented for weakness, fatigue, confusion and slurred speech.  She was initially admitted to the intensive care unit with acute renal failure, electrolyte disturbance and A-fib with RVR with hypotension.  She was started on amiodarone infusion at that time and CT head during that admission showed interval increase in the size of her previously noted meningioma.  Patient was hydrated with improvement of renal function and discharged home with Decadron taper and follow-up with neurosurgery in 7-10 days.  Family reports that she never returned to her baseline following that admission.  In the ED, CT head with no acute process with stable sphenoidal meningioma measuring 2.4 x 3.3 x 2.8 cm with hypodense lesion in the frontal lobe medially to the adjacent mass representing edema/infarct.  Neurosurgery was consulted and patient was transferred to [facility] for further evaluation and management under the hospitalist service.  Significant Hospital Events: 6/24 presented to [facility] ED with new onset seizures, CTH with enlarging meningioma with edema, loaded with Keppra 7/3 Underwent bifrontal craniotomy for meningioma 7/6 CTH with new 7 mm thick subdural hematoma at the foramen magnum and tracking adjacent to the basion and odontoid not currently causing critical stenosis of the foramen magnum per radiology read, but merits surveillance. 7/11  recurrent sz like activity. Not correlated on EEG. CT unchanged.  7/13 neurology signed off. 7/21 palliative care consulted. 7/23 CT chest and abdomen was performed negative for PE or significant pneumonia, without any hemorrhage 7/25 MRI brain without contrast shows increasing mass effect, SDH as well as subacute/acute small scattered infarct. 7/26 tube feeds resumed, neurology reconsulted.  Eliquis resumed per neurosurgery 7/27 oxygenation improving, mentation improving, neurology following.  CTA head and neck without any LVO.  Lower extremity Doppler without any DVT.  Echo shows new systolic dysfunction with global hypokinesis EF 40 to 45% and significant RV failure. 7/29 oxygenation improving to 6 L saturating 94%. 7/30 had another aspiration event. Tube feeds on hold. Now on NRB. Remains full code. No tracheostomy per husband. Per Neurosurgery, give at least two weeks if not more to see if she improves.  8/1 oxygenation improving again to 5 L.  Trickle tube feeds started.  Zofran and PPI scheduled twice daily. 8/2: No significant change, husband reports patient responding occasionally to verbal stimuli 8/4: Patient worked with therapy more yesterday, was able to sit at edge of bed and stand with moderate assistance x2, husband reporting that she has been more verbal, slowly improving  Assessment & Plan:   Sphenoidal meningioma with vasogenic edema Seizure Subdural hemorrhage along [place] Subarachnoid hemorrhage and left occipital horn without hydrocephalus Narrowing foramen magnum due to SDH Patient presenting as a transfer from [facility] following tonic-clonic seizure at home.  Patient with known history of meningioma with interval increase.  Neurosurgery was consulted and patient underwent bifrontal craniotomy for meningioma resection on 03/24/2022.  MRI brain 7/25 with evidence of SDH and SAH.  Neurology was initially consulted and patient underwent multiple EEGs and LTM while  inpatient, last EEG 7/28 negative for any acute seizures. --Palliative care following, appreciate assistance --Apixaban resumed 7/28 --Vimpat 200 mg per tube twice daily --Keppra 1000 mg per tube twice daily --Per neurosurgery, will at least 2 weeks from 7/30 to assess improvement --Further management per neurosurgery  Sepsis in the setting of HCAP and recurrent aspiration pneumonia Acute hypoxic respiratory failure Developed fever of 100.9 on 7/23, COVID-19 PCR negative.  Chest x-ray with new hazy opacities projecting over both upper lobes in the suprahilar regions consistent with developing infection.  CT chest, abdomen/pelvis with no PE, multifocal groundglass opacities likely pulmonary edema. --MRSA PCR negative. 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  Repeat TTE 04/17/2022 with EF 40-45% with global hypokinesis, RV function also moderately low. --net negative 4.9L since admission --wt 85>>>78.6kg --Strict I's and O's Daily weights --Titrate oxygen to maintain SPO2 >92%; on 5L [place] w/ SpO2 98% at rest --Lasix 40 mg PO daily --BMP in am  Acute embolic CVA, incidental Patient with known history of atrial fibrillation, MRI brain with evidence of restricted effusion left cerebellum, bilateral occipital lobe, left parietal lobe, bilateral frontal lobe; concerning for small scattered acute and subacute infarct most likely embolic in the setting of history of A-fib.  Repeat TTE showed mildly degraded LVEF of 40-45%.  Eliquis was  initially held due to/SAH and need for meningioma resection as above but now has been resumed.  LDL 106 April 2023.  Neurology was initially consulted but now signed off. --SLP, remain n.p.o. with cortrak in place and tube feeds initiated --Continue PT/OT efforts while inpatient, currently recommending SNF --Crestor 20 mg per tube daily  Postoperative acute blood loss anemia, expected Iron deficiency anemia Acute on chronic thrombocytopenia Patient was transfused 2 units of PRBCs on 7/22 for hemoglobin drop of 6.8.  Anemia panel with iron 15, sats 8, ferritin 440, vitamin B12 356. --Hgb  9.3>>>6.8>>7.7>8.3>8.7 --Plt 155>>>85>>81>>>257>254>251 --Continue intermittent monitoring of CBC  Dysphagia with recurrent aspiration Remains n.p.o. per recommendations of speech therapy.  Core track placed on 03/28/2022. --Continue tube feeds --If no further improvement, will need to consider PEG/G-tube placement if goals of care remain aggressive  Hypokalemia Potassium 4.3 today, will replete --Repeat BMP in the a.m. with magnesium level  Hyponatremia: Resolved --BMP daily  Anxiety/depression --Sertraline 25 mg per tube daily  Type 2 diabetes mellitus Hemogram A1C 5.9, well controlled. --SSI for coverage  Paroxysmal atrial fibrillation --Amiodarone 20 mg per tube daily --Apixaban 5 mg per tube twice daily  History of essential hypertension Now with hypotension --Midodrine 5 mg per tube BID  GERD: Protonix 40 mg per tube daily  Pressure ulcer left buttock stage II, not POA Pressure Injury 04/07/22 Buttocks Left Stage 2 -  Partial thickness loss of dermis presenting as a shallow open injury with a red, pink wound bed without slough. (Active)  04/07/22 0100  Location: Buttocks  Location Orientation: Left  Staging: Stage 2 -  Partial thickness loss of dermis presenting as a shallow open injury with a red, pink wound bed without slough.  Wound Description (Comments):   Present on  Admission: No  --Continue  wound care, frequent turning/offloading  Obesity Body mass index is 37.5 kg/m.  Discussed with patient needs for aggressive lifestyle changes/weight loss as this complicates all facets of care.  Outpatient follow-up with PCP.    DVT prophylaxis: SCDs Start: 03/26/22 0840 Place TED hose Start: 03/24/22 0616 Place and maintain sequential compression device Start: 03/23/22 0631 apixaban (ELIQUIS) tablet 5 mg    Code Status: Full Code Family Communication:   Disposition Plan:  Level of care: Progressive Status is: Inpatient Remains inpatient appropriate because: Per primary, neurosurgery, PT/OT currently recommending SNF placement     Procedures:  EEG 7/2, 7/7, 7/8, 7/11 Bifrontal craniotomy for resection of meningioma, Dr. Christella Noa 7/3  Antimicrobials:  Vancomycin 7/19 - 7/20 Cefepime 7/19 - 7/21 Cefazolin 7/3 - 7/3 Unasyn 7/7 - 7/10, 7/21>>   Subjective: Patient seen examined bedside, lying in bed.  Husband present.  Worked more with physical therapy yesterday, and spouse stating continues improved verbal communication.  No specific complaints this morning.  Very slow improvement but making some progress over the last 2 days.  Patient denies headache, no chest pain, no shortness of breath, no abdominal pain.  No acute concerns overnight per nursing staff.  Objective: Vitals:   04/24/22 2310 04/25/22 0310 04/25/22 0500 04/25/22 0746  BP: (!) 94/53 108/62  (!) 108/56  Pulse:  88  88  Resp:  17  19  Temp: 98.2 F (36.8 C) 98 F (36.7 C)  (!) 97.5 F (36.4 C)  TempSrc: Axillary Axillary  Oral  SpO2:  94%  93%  Weight:   78.6 kg   Height:        Intake/Output Summary (Last 24 hours) at 04/25/2022 1016 Last data filed at 04/25/2022 0324 Gross per 24 hour  Intake --  Output 1000 ml  Net -1000 ml   Filed Weights   04/23/22 0500 04/24/22 0500 04/25/22 0500  Weight: 82.4 kg 72.9 kg 78.6 kg    Examination:  Physical Exam: GEN: Chronically ill  in appearance, alert, NAD HEENT: Frontal craniotomy incision site noted, dry mucous membranes, PERRL PULM: Decreased breath sounds bilateral bases, no crackles/wheezing, normal respiratory effort on 6 L nasal cannula with SPO2 94% at rest CV: RRR w/o M/G/R GI: abd soft, NTND, NABS, no R/G/M MSK: No peripheral edema    Data Reviewed: I have personally reviewed following labs and imaging studies  CBC: Recent Labs  Lab 04/20/22 0225 04/21/22 0157 04/22/22 0636 04/23/22 0607 04/24/22 0159  WBC 8.6 12.5* 9.8 9.2 10.6*  NEUTROABS 6.3 10.0* 7.3 6.7 7.6  HGB 9.3* 9.7* 9.7* 9.3* 8.7*  HCT 29.6* 31.0* 31.7* 30.7* 28.5*  MCV 88.1 87.6 89.5 90.0 90.2  PLT 217 236 257 254 053   Basic Metabolic Panel: Recent Labs  Lab 04/19/22 2003 04/20/22 1141 04/21/22 0157 04/22/22 0636 04/23/22 0607 04/24/22 0159 04/25/22 0242  NA 147*   < > 147* 145 148* 146* 143  K 3.4*   < > 3.5 3.2* 3.5 3.4* 4.3  CL 105   < > 106 106 112* 111 109  CO2 32   < > '31 29 28 27 26  '$ GLUCOSE 125*   < > 95 93 151* 156* 155*  BUN 35*   < > 35* 27* '23 22 20  '$ CREATININE 1.07*   < > 1.03* 0.99 0.89 0.87 0.83  CALCIUM 8.6*   < > 8.9 8.7* 8.6* 8.4* 8.5*  MG 2.2  --  2.1 2.2 2.1  --  1.9   < > =  values in this interval not displayed.   GFR: Estimated Creatinine Clearance: 49.7 mL/min (by C-G formula based on SCr of 0.83 mg/dL). Liver Function Tests: Recent Labs  Lab 04/22/22 0636  AST 39  ALT 40  ALKPHOS 78  BILITOT 0.7  PROT 6.3*  ALBUMIN 1.9*   No results for input(s): "LIPASE", "AMYLASE" in the last 168 hours. No results for input(s): "AMMONIA" in the last 168 hours. Coagulation Profile: No results for input(s): "INR", "PROTIME" in the last 168 hours. Cardiac Enzymes: No results for input(s): "CKTOTAL", "CKMB", "CKMBINDEX", "TROPONINI" in the last 168 hours. BNP (last 3 results) No results for input(s): "PROBNP" in the last 8760 hours. HbA1C: No results for input(s): "HGBA1C" in the last 72  hours. CBG: Recent Labs  Lab 04/24/22 0609 04/24/22 1136 04/24/22 1726 04/24/22 2326 04/25/22 0625  GLUCAP 148* 146* 167* 166* 160*   Lipid Profile: No results for input(s): "CHOL", "HDL", "LDLCALC", "TRIG", "CHOLHDL", "LDLDIRECT" in the last 72 hours. Thyroid Function Tests: No results for input(s): "TSH", "T4TOTAL", "FREET4", "T3FREE", "THYROIDAB" in the last 72 hours. Anemia Panel: No results for input(s): "VITAMINB12", "FOLATE", "FERRITIN", "TIBC", "IRON", "RETICCTPCT" in the last 72 hours. Sepsis Labs: No results for input(s): "PROCALCITON", "LATICACIDVEN" in the last 168 hours.  No results found for this or any previous visit (from the past 240 hour(s)).        Radiology Studies: DG CHEST PORT 1 VIEW  Result Date: 04/24/2022 CLINICAL DATA:  Provided history: Respiratory abnormalities. History of CHF, hypertension, nonsmoker. EXAM: PORTABLE CHEST 1 VIEW COMPARISON:  Prior chest radiographs 04/20/2022 and earlier. FINDINGS: An enteric tube passes below the level of left hemidiaphragm with tip excluded from the field of view. Cardiomegaly. Aortic atherosclerosis. Redemonstrated calcified mediastinal/hilar lymph nodes, likely reflecting sequela of prior granulomatous disease. Bilateral interstitial opacities and ill-defined airspace opacities, progressed from the prior chest radiograph 04/20/2022. No evidence of pleural effusion or pneumothorax. No acute bony abnormality identified. Surgical clips within the right axilla. IMPRESSION: Progressive bilateral interstitial opacities and ill-defined airspace opacities, which may reflect pulmonary edema and/or infection. Cardiomegaly. Aortic Atherosclerosis (ICD10-I70.0). Electronically Signed   By: Kellie Simmering D.O.   On: 04/24/2022 08:52        Scheduled Meds:  amiodarone  200 mg Per Tube Daily   apixaban  5 mg Per Tube BID   budesonide (PULMICORT) nebulizer solution  0.25 mg Nebulization BID   Chlorhexidine Gluconate Cloth  6  each Topical Daily   feeding supplement (PROSource TF)  45 mL Per Tube TID   free water  300 mL Per Tube Q4H   furosemide  40 mg Per Tube Daily   insulin aspart  0-15 Units Subcutaneous Q6H   lacosamide  200 mg Per Tube BID   levETIRAcetam  1,000 mg Per Tube BID   midodrine  5 mg Per Tube BID WC   montelukast  10 mg Per Tube QHS   multivitamin with minerals  1 tablet Per Tube Daily   ondansetron  4 mg Per Tube Q12H   mouth rinse  15 mL Mouth Rinse 4 times per day   pantoprazole sodium  40 mg Per Tube BID   polyethylene glycol  17 g Per NG tube Daily   rosuvastatin  20 mg Per Tube Daily   senna  1 tablet Per Tube BID   sertraline  25 mg Per Tube Daily   Continuous Infusions:  feeding supplement (OSMOLITE 1.5 CAL) 1,000 mL (04/24/22 1747)     LOS: 41 days  Time spent: 49 minutes spent on chart review, discussion with nursing staff, consultants, updating family and interview/physical exam; more than 50% of that time was spent in counseling and/or coordination of care.    Shenea Giacobbe J [place] DO [facility] Available via Epic secure chat 7am-7pm After these hours, please refer to coverage provider listed on [email or web address] 04/25/2022, 10:16 AM

## 2022-04-25 NOTE — Progress Notes (Signed)
Nutrition Follow-up  DOCUMENTATION CODES:   Obesity unspecified  INTERVENTION:   Tube feeding via Cortrak tube Osmolite 1.5 @ 40 ml/hr 45 ml ProSource TF BID 200 ml free water flush every 4 hours  TF regimen at goal provides:  1575 kcal, 93 grams protein, and 729 ml free water.   Free water: 300 ml every 4 hours Total free water: 2529 ml   Continue Multivitamin w/ minerals daily  NUTRITION DIAGNOSIS:   Inadequate oral intake related to inability to eat as evidenced by NPO status. - Ongoing  GOAL:   Patient will meet greater than or equal to 90% of their needs -  Met with TF at goal   MONITOR:   I & O's  REASON FOR ASSESSMENT:   Consult Enteral/tube feeding initiation and management  ASSESSMENT:   Pt with PMH of DM, Afib on Eliquis, CKD stage III, CHF, HTN, anemia of chronic illness and sphenoidal meningioma admitted for new onset seizures.  Per neurosurgery pt making slow progress. Pt working with therapy EOB.    6/24 - admit with new onset seizures 7/03 - s/p bifrontal craniotomy for meningioma resection 7/06 - less responsive, vomiting overnight with suspected aspiration 7/07 - s/p cortrak placement; tip distal stomach/proximal duodenum  7/08 - TF changed to Glucerna 1.5 per MD  7/24 - TF held due to concern of aspiration 07/26 - TF resumed 07/30 - TF held due to aspiration 08/01 - TF resumed at trickle and advanced slowly. Discussed with provider that change in TF/tube would not prevent aspiration of secretions.   Medications reviewed and include: lasix, NovoLog, zofran every 12 hours (8/1), MVI, Protonix, miralax, Senokot  Labs reviewed:  CBG's: 146-180  Diet Order:   Diet Order             Diet NPO time specified  Diet effective now                   EDUCATION NEEDS:   Not appropriate for education at this time  Skin:  Skin Assessment: Skin Integrity Issues: Skin Integrity Issues:: Stage II Stage II: buttocks  Last BM:  8/4 large  (scheduled BM regimen meds held today)  Height:  Ht Readings from Last 1 Encounters:  03/15/22 _0  (1.448 m)   Weight:  Wt Readings from Last 1 Encounters:  04/25/22 78.6 kg   BMI:  Body mass index is 37.5 kg/m.  Estimated Nutritional Needs:   Kcal:  1500-1700  Protein:  85-100 grams  Fluid:  >1.5 L/day   Lockie Pares., RD, LDN, CNSC See AMiON for contact information

## 2022-04-26 DIAGNOSIS — R569 Unspecified convulsions: Secondary | ICD-10-CM | POA: Diagnosis not present

## 2022-04-26 LAB — GLUCOSE, CAPILLARY
Glucose-Capillary: 138 mg/dL — ABNORMAL HIGH (ref 70–99)
Glucose-Capillary: 126 mg/dL — ABNORMAL HIGH (ref 70–99)
Glucose-Capillary: 127 mg/dL — ABNORMAL HIGH (ref 70–99)
Glucose-Capillary: 146 mg/dL — ABNORMAL HIGH (ref 70–99)

## 2022-04-26 NOTE — Progress Notes (Addendum)
Pt noted to be with increased WOB no cough noted. Lung sounds clear/diminished bilaterally. Pt responds to verbal stimuli with mumbled responses. SpO2 at 88-89% on 5L HFNC. Increased O2 to 8L HFNC pt maintaining between 92-93%. MD notified. Awaiting response.   [place] Per Dr. [place] will repeat cxr in AM.

## 2022-04-26 NOTE — Progress Notes (Signed)
PROGRESS NOTE    Tara [name]  JKD:[id number] DOB: [birth year] DOA: 03/15/2022 PCP: [name], MD    Brief Narrative:   Tara [name] is a 76 y.o. female with past medical history significant for anxiety/depression, type 2 diabetes mellitus, hyperlipidemia, paroxysmal atrial fibrillation on Eliquis, essential hypertension, nonischemic cardiomyopathy, CKD stage IIIb, breast cancer s/p chemoradiation, history of sphenoidal meningioma who presented to [facility] ED on 6/24 with new onset seizures.    Patient recently discharged on 03/12/2022 after she presented for weakness, fatigue, confusion and slurred speech.  She was initially admitted to the intensive care unit with acute renal failure, electrolyte disturbance and A-fib with RVR with hypotension.  She was started on amiodarone infusion at that time and CT head during that admission showed interval increase in the size of her previously noted meningioma.  Patient was hydrated with improvement of renal function and discharged home with Decadron taper and follow-up with neurosurgery in 7-10 days.  Family reports that she never returned to her baseline following that admission.  In the ED, CT head with no acute process with stable sphenoidal meningioma measuring 2.4 x 3.3 x 2.8 cm with hypodense lesion in the frontal lobe medially to the adjacent mass representing edema/infarct.  Neurosurgery was consulted and patient was transferred to [facility] for further evaluation and management under the hospitalist service.  Significant Hospital Events: 6/24 presented to [facility] ED with new onset seizures, CTH with enlarging meningioma with edema, loaded with Keppra 7/3 Underwent bifrontal craniotomy for meningioma 7/6 CTH with new 7 mm thick subdural hematoma at the foramen magnum and tracking adjacent to the basion and odontoid not currently causing critical stenosis of the foramen magnum per radiology read, but merits surveillance. 7/11  recurrent sz like activity. Not correlated on EEG. CT unchanged.  7/13 neurology signed off. 7/21 palliative care consulted. 7/23 CT chest and abdomen was performed negative for PE or significant pneumonia, without any hemorrhage 7/25 MRI brain without contrast shows increasing mass effect, SDH as well as subacute/acute small scattered infarct. 7/26 tube feeds resumed, neurology reconsulted.  Eliquis resumed per neurosurgery 7/27 oxygenation improving, mentation improving, neurology following.  CTA head and neck without any LVO.  Lower extremity Doppler without any DVT.  Echo shows new systolic dysfunction with global hypokinesis EF 40 to 45% and significant RV failure. 7/29 oxygenation improving to 6 L saturating 94%. 7/30 had another aspiration event. Tube feeds on hold. Now on NRB. Remains full code. No tracheostomy per husband. Per Neurosurgery, give at least two weeks if not more to see if she improves.  8/1 oxygenation improving again to 5 L.  Trickle tube feeds started.  Zofran and PPI scheduled twice daily. 8/2: No significant change, husband reports patient responding occasionally to verbal stimuli 8/4: Patient worked with therapy more yesterday, was able to sit at edge of bed and stand with moderate assistance x2, husband reporting that she has been more verbal, slowly improving 8/5: More somnolent today, but awakes to command with mumbling speech.  Did not work with therapy yesterday.  Assessment & Plan:   Sphenoidal meningioma with vasogenic edema Seizure Subdural hemorrhage along [place] Subarachnoid hemorrhage and left occipital horn without hydrocephalus Narrowing foramen magnum due to SDH Patient presenting as a transfer from [facility] following tonic-clonic seizure at home.  Patient with known history of meningioma with interval increase.  Neurosurgery was consulted and patient underwent bifrontal craniotomy for meningioma resection on 03/24/2022.  MRI brain 7/25 with  evidence of SDH and SAH.  Neurology was initially consulted and patient underwent multiple EEGs and LTM while inpatient, last EEG 7/28 negative for any acute seizures. --Palliative care following, appreciate assistance --Apixaban resumed 7/28 --Vimpat 200 mg per tube twice daily --Keppra 1000 mg per tube twice daily --Per neurosurgery, will at least 2 weeks from 7/30 to assess improvement --Further management per neurosurgery  Sepsis in the setting of HCAP and recurrent aspiration pneumonia Acute hypoxic respiratory failure Developed fever of 100.9 on 7/23, COVID-19 PCR negative.  Chest x-ray with new hazy opacities projecting over both upper lobes in the suprahilar regions consistent with developing infection.  CT chest, abdomen/pelvis with no PE, multifocal groundglass opacities likely pulmonary edema.  Completed course of Unasyn. --MRSA PCR negative. 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  Repeat TTE 04/17/2022 with EF 40-45% with global hypokinesis, RV function also moderately low. --net negative 6.6L since admission --wt 85>>>77.7kg --Strict I's and O's Daily weights --Titrate oxygen to maintain SPO2 >92%; on 5L [place] w/ SpO2 98% at rest --Lasix 40 mg PO daily --BMP in am  Acute embolic CVA, incidental Patient with known history of atrial fibrillation, MRI brain with evidence of restricted effusion left cerebellum, bilateral occipital lobe, left parietal lobe, bilateral frontal lobe; concerning for small scattered acute and subacute infarct most likely embolic in the  setting of history of A-fib.  Repeat TTE showed mildly degraded LVEF of 40-45%.  Eliquis was initially held due to/SAH and need for meningioma resection as above but now has been resumed.  LDL 106 April 2023.  Neurology was initially consulted but now signed off. --SLP, remain n.p.o. with cortrak in place and tube feeds initiated --Continue PT/OT efforts while inpatient, currently recommending SNF --Crestor 20 mg per tube daily  Postoperative acute blood loss anemia, expected Iron deficiency anemia Acute on chronic thrombocytopenia Patient was transfused 2 units of PRBCs on 7/22 for hemoglobin drop of 6.8.  Anemia panel with iron 15, sats 8, ferritin 440, vitamin B12 356. --Hgb  9.3>>>6.8>>7.7>8.3>8.7 --Plt 155>>>85>>81>>>257>254>251 --Continue intermittent monitoring of CBC  Dysphagia with recurrent aspiration Remains n.p.o. per recommendations of speech therapy.  Core track placed on 03/28/2022. --Continue tube feeds --If no further improvement, will need to consider PEG/G-tube placement if goals of care remain aggressive  Hypokalemia Repleted, continue intermittent monitoring of electrolytes  Hyponatremia: Resolved Continue intermittent monitoring of electrolytes  Anxiety/depression --Sertraline 25 mg per tube daily  Type 2 diabetes mellitus Hemogram A1C 5.9, well controlled. --SSI for coverage  Paroxysmal atrial fibrillation --Amiodarone 20 mg per tube daily --Apixaban 5 mg per tube twice daily  History of essential hypertension Now with hypotension --Midodrine 5 mg per tube BID  GERD: Protonix 40 mg per tube daily  Pressure ulcer left buttock stage II, not POA Pressure Injury 04/07/22 Buttocks Left Stage 2 -  Partial thickness loss of dermis presenting as a shallow open injury with a red, pink wound bed without slough. (Active)  04/07/22 0100  Location: Buttocks  Location Orientation: Left  Staging: Stage 2 -  Partial thickness loss of dermis presenting as a shallow  open injury with a red, pink wound bed without slough.  Wound  Description (Comments):   Present on Admission: No  --Continue wound care, frequent turning/offloading  Obesity Body mass index is 37.07 kg/m.  Discussed with patient needs for aggressive lifestyle changes/weight loss as this complicates all facets of care.  Outpatient follow-up with PCP.    DVT prophylaxis: SCDs Start: 03/26/22 0840 Place TED hose Start: 03/24/22 0616 Place and maintain sequential compression device Start: 03/23/22 0631 apixaban (ELIQUIS) tablet 5 mg    Code Status: Full Code Family Communication:   Disposition Plan:  Level of care: Progressive Status is: Inpatient Remains inpatient appropriate because: Per primary, neurosurgery, PT/OT currently recommending SNF placement     Procedures:  EEG 7/2, 7/7, 7/8, 7/11 Bifrontal craniotomy for resection of meningioma, Dr. Christella Noa 7/3  Antimicrobials:  Vancomycin 7/19 - 7/20 Cefepime 7/19 - 7/21 Cefazolin 7/3 - 7/3 Unasyn 7/7 - 7/10, 7/21 - 8/4   Subjective: Patient seen examined bedside, lying in bed.  Husband present. But arouses to verbal command, mumbling speech.  Did not work with physical therapy yesterday.  Spouse present at bedside, and reports that she did communicate with him fairly well last night.  Remains very slow improvement.  Patient denies headache, no chest pain, no shortness of breath, no abdominal pain.  No acute concerns overnight per nursing staff.  Objective: Vitals:   04/26/22 0500 04/26/22 0700 04/26/22 0848 04/26/22 1100  BP:  99/62  112/65  Pulse:  76  74  Resp:  20  15  Temp:  98.1 F (36.7 C)  98 F (36.7 C)  TempSrc:  Axillary  Axillary  SpO2:  98% 93% 95%  Weight: 77.7 kg     Height:        Intake/Output Summary (Last 24 hours) at 04/26/2022 1135 Last data filed at 04/26/2022 1100 Gross per 24 hour  Intake --  Output 2650 ml  Net -2650 ml   Filed Weights   04/24/22 0500 04/25/22 0500 04/26/22 0500  Weight:  72.9 kg 78.6 kg 77.7 kg    Examination:  Physical Exam: GEN: Chronically ill in appearance, alert, NAD HEENT: Frontal craniotomy incision site noted, dry mucous membranes, PERRL PULM: Decreased breath sounds bilateral bases, no crackles/wheezing, normal respiratory effort on 6 L nasal cannula with SPO2 94% at rest CV: RRR w/o M/G/R GI: abd soft, NTND, NABS, no R/G/M MSK: No peripheral edema    Data Reviewed: I have personally reviewed following labs and imaging studies  CBC: Recent Labs  Lab 04/20/22 0225 04/21/22 0157 04/22/22 0636 04/23/22 0607 04/24/22 0159  WBC 8.6 12.5* 9.8 9.2 10.6*  NEUTROABS 6.3 10.0* 7.3 6.7 7.6  HGB 9.3* 9.7* 9.7* 9.3* 8.7*  HCT 29.6* 31.0* 31.7* 30.7* 28.5*  MCV 88.1 87.6 89.5 90.0 90.2  PLT 217 236 257 254 811   Basic Metabolic Panel: Recent Labs  Lab 04/19/22 2003 04/20/22 1141 04/21/22 0157 04/22/22 0636 04/23/22 0607 04/24/22 0159 04/25/22 0242  NA 147*   < > 147* 145 148* 146* 143  K 3.4*   < > 3.5 3.2* 3.5 3.4* 4.3  CL 105   < > 106 106 112* 111 109  CO2 32   < > '31 29 28 27 26  '$ GLUCOSE 125*   < > 95 93 151* 156* 155*  BUN 35*   < > 35* 27* '23 22 20  '$ CREATININE 1.07*   < > 1.03* 0.99 0.89 0.87 0.83  CALCIUM 8.6*   < > 8.9 8.7* 8.6* 8.4* 8.5*  MG 2.2  --  2.1 2.2  2.1  --  1.9   < > = values in this interval not displayed.   GFR: Estimated Creatinine Clearance: 49.3 mL/min (by C-G formula based on SCr of 0.83 mg/dL). Liver Function Tests: Recent Labs  Lab 04/22/22 0636  AST 39  ALT 40  ALKPHOS 78  BILITOT 0.7  PROT 6.3*  ALBUMIN 1.9*   No results for input(s): "LIPASE", "AMYLASE" in the last 168 hours. No results for input(s): "AMMONIA" in the last 168 hours. Coagulation Profile: No results for input(s): "INR", "PROTIME" in the last 168 hours. Cardiac Enzymes: No results for input(s): "CKTOTAL", "CKMB", "CKMBINDEX", "TROPONINI" in the last 168 hours. BNP (last 3 results) No results for input(s): "PROBNP" in the  last 8760 hours. HbA1C: No results for input(s): "HGBA1C" in the last 72 hours. CBG: Recent Labs  Lab 04/25/22 0625 04/25/22 1202 04/25/22 1706 04/25/22 2311 04/26/22 0550  GLUCAP 160* 150* 142* 164* 138*   Lipid Profile: No results for input(s): "CHOL", "HDL", "LDLCALC", "TRIG", "CHOLHDL", "LDLDIRECT" in the last 72 hours. Thyroid Function Tests: No results for input(s): "TSH", "T4TOTAL", "FREET4", "T3FREE", "THYROIDAB" in the last 72 hours. Anemia Panel: No results for input(s): "VITAMINB12", "FOLATE", "FERRITIN", "TIBC", "IRON", "RETICCTPCT" in the last 72 hours. Sepsis Labs: No results for input(s): "PROCALCITON", "LATICACIDVEN" in the last 168 hours.  No results found for this or any previous visit (from the past 240 hour(s)).        Radiology Studies: No results found.      Scheduled Meds:  amiodarone  200 mg Per Tube Daily   apixaban  5 mg Per Tube BID   budesonide (PULMICORT) nebulizer solution  0.25 mg Nebulization BID   Chlorhexidine Gluconate Cloth  6 each Topical Daily   feeding supplement (PROSource TF)  45 mL Per Tube TID   free water  300 mL Per Tube Q4H   furosemide  40 mg Per Tube Daily   insulin aspart  0-15 Units Subcutaneous Q6H   lacosamide  200 mg Per Tube BID   levETIRAcetam  1,000 mg Per Tube BID   midodrine  5 mg Per Tube BID WC   montelukast  10 mg Per Tube QHS   multivitamin with minerals  1 tablet Per Tube Daily   ondansetron  4 mg Per Tube Q12H   mouth rinse  15 mL Mouth Rinse 4 times per day   pantoprazole sodium  40 mg Per Tube BID   polyethylene glycol  17 g Per NG tube Daily   rosuvastatin  20 mg Per Tube Daily   senna  1 tablet Per Tube BID   sertraline  25 mg Per Tube Daily   Continuous Infusions:  feeding supplement (OSMOLITE 1.5 CAL) 1,000 mL (04/25/22 1656)     LOS: 42 days    Time spent: 49 minutes spent on chart review, discussion with nursing staff, consultants, updating family and interview/physical exam; more  than 50% of that time was spent in counseling and/or coordination of care.    Mecca Barga J [place] DO [facility] Available via Epic secure chat 7am-7pm After these hours, please refer to coverage provider listed on [email or web address] 04/26/2022, 11:35 AM

## 2022-04-26 NOTE — Progress Notes (Signed)
Subjective: The patient is somnolent but arousable.  She is in no apparent distress.  Her husband is at the bedside.  Objective: Vital signs in last 24 hours: Temp:  [97.7 F (36.5 C)-98.1 F (36.7 C)] 98.1 F (36.7 C) (08/05 0700) Pulse Rate:  [76-97] 76 (08/05 0700) Resp:  [17-22] 20 (08/05 0700) BP: (96-114)/(59-75) 99/62 (08/05 0700) SpO2:  [92 %-98 %] 93 % (08/05 0848) Weight:  [77.7 kg] 77.7 kg (08/05 0500) Estimated body mass index is 37.07 kg/m as calculated from the following:   Height as of this encounter: '4\' 9"'$  (1.448 m).   Weight as of this encounter: 77.7 kg.   Intake/Output from previous day: 08/04 0701 - 08/05 0700 In: -  Out: 1700 [Urine:1700] Intake/Output this shift: No intake/output data recorded.  Physical exam the patient is somnolent but arousable.  She will mumble.  She moves all 4 extremities.  She does not quite follow commands.  Her craniotomy incision is healing well.  Lab Results: Recent Labs    04/24/22 0159  WBC 10.6*  HGB 8.7*  HCT 28.5*  PLT 251   BMET Recent Labs    04/24/22 0159 04/25/22 0242  NA 146* 143  K 3.4* 4.3  CL 111 109  CO2 27 26  GLUCOSE 156* 155*  BUN 22 20  CREATININE 0.87 0.83  CALCIUM 8.4* 8.5*    Studies/Results: No results found.  Assessment/Plan: Status post craniotomy: Continue supportive care.  I spoke with her husband.  LOS: 42 days     Ophelia Charter 04/26/2022, 9:32 AM     Patient ID: [name], female   DOB: [birth year], 76 y.o.   MRN: [id number]

## 2022-04-27 ENCOUNTER — Inpatient Hospital Stay (HOSPITAL_COMMUNITY): Payer: Medicare Other

## 2022-04-27 DIAGNOSIS — Z515 Encounter for palliative care: Secondary | ICD-10-CM | POA: Diagnosis not present

## 2022-04-27 DIAGNOSIS — R569 Unspecified convulsions: Secondary | ICD-10-CM | POA: Diagnosis not present

## 2022-04-27 LAB — GLUCOSE, CAPILLARY
Glucose-Capillary: 128 mg/dL — ABNORMAL HIGH (ref 70–99)
Glucose-Capillary: 132 mg/dL — ABNORMAL HIGH (ref 70–99)
Glucose-Capillary: 135 mg/dL — ABNORMAL HIGH (ref 70–99)
Glucose-Capillary: 137 mg/dL — ABNORMAL HIGH (ref 70–99)

## 2022-04-27 LAB — BASIC METABOLIC PANEL
Anion gap: 10 (ref 5–15)
BUN: 23 mg/dL (ref 8–23)
CO2: 25 mmol/L (ref 22–32)
Calcium: 8.4 mg/dL — ABNORMAL LOW (ref 8.9–10.3)
Chloride: 97 mmol/L — ABNORMAL LOW (ref 98–111)
Creatinine, Ser: 0.77 mg/dL (ref 0.44–1.00)
GFR, Estimated: >60 mL/min (ref >60)
Glucose, Bld: 143 mg/dL — ABNORMAL HIGH (ref 70–99)
Potassium: 3.8 mmol/L (ref 3.5–5.1)
Sodium: 132 mmol/L — ABNORMAL LOW (ref 135–145)

## 2022-04-27 LAB — CBC
HCT: 30 % — ABNORMAL LOW (ref 36.0–46.0)
Hemoglobin: 9.4 g/dL — ABNORMAL LOW (ref 12.0–15.0)
MCH: 27.3 pg (ref 26.0–34.0)
MCHC: 31.3 g/dL (ref 30.0–36.0)
MCV: 87.2 fL (ref 80.0–100.0)
Platelets: 267 10*3/uL (ref 150–400)
RBC: 3.44 MIL/uL — ABNORMAL LOW (ref 3.87–5.11)
RDW: 17.2 % — ABNORMAL HIGH (ref 11.5–15.5)
WBC: 13.7 10*3/uL — ABNORMAL HIGH (ref 4.0–10.5)
nRBC: 0 % (ref 0.0–0.2)

## 2022-04-27 LAB — MAGNESIUM: Magnesium: 1.9 mg/dL (ref 1.7–2.4)

## 2022-04-27 NOTE — Progress Notes (Signed)
PROGRESS NOTE    Tara [name]  XAJ:[id number] DOB: May 27, 1946 DOA: 03/15/2022 PCP: [name], MD    Brief Narrative:   Tara [name] is a 76 y.o. female with past medical history significant for anxiety/depression, type 2 diabetes mellitus, hyperlipidemia, paroxysmal atrial fibrillation on Eliquis, essential hypertension, nonischemic cardiomyopathy, CKD stage IIIb, breast cancer s/p chemoradiation, history of sphenoidal meningioma who presented to [facility] ED on 6/24 with new onset seizures.    Patient recently discharged on 03/12/2022 after she presented for weakness, fatigue, confusion and slurred speech.  She was initially admitted to the intensive care unit with acute renal failure, electrolyte disturbance and A-fib with RVR with hypotension.  She was started on amiodarone infusion at that time and CT head during that admission showed interval increase in the size of her previously noted meningioma.  Patient was hydrated with improvement of renal function and discharged home with Decadron taper and follow-up with neurosurgery in 7-10 days.  Family reports that she never returned to her baseline following that admission.  In the ED, CT head with no acute process with stable sphenoidal meningioma measuring 2.4 x 3.3 x 2.8 cm with hypodense lesion in the frontal lobe medially to the adjacent mass representing edema/infarct.  Neurosurgery was consulted and patient was transferred to [facility] for further evaluation and management under the hospitalist service.  Significant Hospital Events: 6/24 presented to [facility] ED with new onset seizures, CTH with enlarging meningioma with edema, loaded with Keppra 7/3 Underwent bifrontal craniotomy for meningioma 7/6 CTH with new 7 mm thick subdural hematoma at the foramen magnum and tracking adjacent to the basion and odontoid not currently causing critical stenosis of the foramen magnum per radiology read, but merits surveillance. 7/11  recurrent sz like activity. Not correlated on EEG. CT unchanged.  7/13 neurology signed off. 7/21 palliative care consulted. 7/23 CT chest and abdomen was performed negative for PE or significant pneumonia, without any hemorrhage 7/25 MRI brain without contrast shows increasing mass effect, SDH as well as subacute/acute small scattered infarct. 7/26 tube feeds resumed, neurology reconsulted.  Eliquis resumed per neurosurgery 7/27 oxygenation improving, mentation improving, neurology following.  CTA head and neck without any LVO.  Lower extremity Doppler without any DVT.  Echo shows new systolic dysfunction with global hypokinesis EF 40 to 45% and significant RV failure. 7/29 oxygenation improving to 6 L saturating 94%. 7/30 had another aspiration event. Tube feeds on hold. Now on NRB. Remains full code. No tracheostomy per husband. Per Neurosurgery, give at least two weeks if not more to see if she improves.  8/1 oxygenation improving again to 5 L.  Trickle tube feeds started.  Zofran and PPI scheduled twice daily. 8/2: No significant change, husband reports patient responding occasionally to verbal stimuli 8/4: Patient worked with therapy more yesterday, was able to sit at edge of bed and stand with moderate assistance x2, husband reporting that she has been more verbal, slowly improving 8/5: More somnolent today, but awakes to command with mumbling speech.  Did not work with therapy yesterday.  Assessment & Plan:   Sphenoidal meningioma with vasogenic edema Seizure Subdural hemorrhage along [place] Subarachnoid hemorrhage and left occipital horn without hydrocephalus Narrowing foramen magnum due to SDH Patient presenting as a transfer from [facility] following tonic-clonic seizure at home.  Patient with known history of meningioma with interval increase.  Neurosurgery was consulted and patient underwent bifrontal craniotomy for meningioma resection on 03/24/2022.  MRI brain 7/25 with  evidence of SDH and SAH.  Neurology was initially consulted and patient underwent multiple EEGs and LTM while inpatient, last EEG 7/28 negative for any acute seizures. --Palliative care following, appreciate assistance --Apixaban resumed 7/28 --Vimpat 200 mg per tube twice daily --Keppra 1000 mg per tube twice daily --Per neurosurgery, will at least 2 weeks from 7/30 to assess improvement --Further management per neurosurgery  Sepsis in the setting of HCAP and recurrent aspiration pneumonia Acute hypoxic respiratory failure Developed fever of 100.9 on 7/23, COVID-19 PCR negative.  Chest x-ray with new hazy opacities projecting over both upper lobes in the suprahilar regions consistent with developing infection.  CT chest, abdomen/pelvis with no PE, multifocal groundglass opacities likely pulmonary edema.  Completed course of Unasyn. --MRSA PCR negative. 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  Repeat TTE 04/17/2022 with EF 40-45% with global hypokinesis, RV function also moderately low. --net negative 9.4L since admission --wt 85>>>73.3kg --Strict I's and O's Daily weights --Titrate oxygen to maintain SPO2 >92%; on 6L [place] w/ SpO2 97% at rest --Lasix 40 mg per tube daily --BMP in am  Acute embolic CVA, incidental Patient with known history of atrial fibrillation, MRI brain with evidence of restricted effusion left cerebellum, bilateral occipital lobe, left parietal lobe, bilateral frontal lobe; concerning for small scattered acute and subacute infarct most likely embolic  in the setting of history of A-fib.  Repeat TTE showed mildly degraded LVEF of 40-45%.  Eliquis was initially held due to/SAH and need for meningioma resection as above but now has been resumed.  LDL 106 April 2023.  Neurology was initially consulted but now signed off. --SLP, remain n.p.o. with cortrak in place and tube feeds initiated --Continue PT/OT efforts while inpatient, currently recommending SNF --Crestor 20 mg per tube daily  Postoperative acute blood loss anemia, expected Iron deficiency anemia Acute on chronic thrombocytopenia Patient was transfused 2 units of PRBCs on 7/22 for hemoglobin drop of 6.8.  Anemia panel with iron 15, sats 8, ferritin 440, vitamin B12 356. --Hgb  9.3>>>6.8>>7.7>8.3>8.7>9.4 --Plt 155>>>85>>81>>>257>254>251 --Continue intermittent monitoring of CBC  Dysphagia with recurrent aspiration Remains n.p.o. per recommendations of speech therapy.  Core track placed on 03/28/2022. --Continue tube feeds --If no further improvement, will need to consider PEG/G-tube placement if goals of care remain aggressive  Hypokalemia Repleted, continue intermittent monitoring of electrolytes  Hyponatremia: Resolved Continue intermittent monitoring of electrolytes  Anxiety/depression --Sertraline 25 mg per tube daily  Type 2 diabetes mellitus Hemogram A1C 5.9, well controlled. --SSI for coverage  Paroxysmal atrial fibrillation --Amiodarone 20 mg per tube daily --Apixaban 5 mg per tube twice daily  History of essential hypertension Now with hypotension --Midodrine 5 mg per tube BID  GERD: Protonix 40 mg per tube daily  Pressure ulcer left buttock stage II, not POA Pressure Injury 04/07/22 Buttocks Left Stage 2 -  Partial thickness loss of dermis presenting as a shallow open injury with a red, pink wound bed without slough. (Active)  04/07/22 0100  Location: Buttocks  Location Orientation: Left  Staging: Stage 2 -  Partial thickness loss of dermis presenting as  a shallow open injury with a red, pink wound bed without  slough.  Wound Description (Comments):   Present on Admission: No  --Continue wound care, frequent turning/offloading  Obesity Body mass index is 34.97 kg/m.  Discussed with patient needs for aggressive lifestyle changes/weight loss as this complicates all facets of care.  Outpatient follow-up with PCP.    DVT prophylaxis: SCDs Start: 03/26/22 0840 Place TED hose Start: 03/24/22 0616 Place and maintain sequential compression device Start: 03/23/22 0631 apixaban (ELIQUIS) tablet 5 mg    Code Status: Full Code Family Communication:   Disposition Plan:  Level of care: Progressive Status is: Inpatient Remains inpatient appropriate because: Per primary, neurosurgery, PT/OT currently recommending SNF placement     Procedures:  EEG 7/2, 7/7, 7/8, 7/11 Bifrontal craniotomy for resection of meningioma, Dr. Christella Noa 7/3  Antimicrobials:  Vancomycin 7/19 - 7/20 Cefepime 7/19 - 7/21 Cefazolin 7/3 - 7/3 Unasyn 7/7 - 7/10, 7/21 - 8/4   Subjective: Patient seen examined bedside, lying in bed.  Husband present. But arouses to verbal command, answers questions appropriately in short sentences. Remains very slow improvement.  Patient denies chest pain, no shortness of breath, no abdominal pain.  No acute concerns overnight per nursing staff.  Objective: Vitals:   04/27/22 0308 04/27/22 0500 04/27/22 0725 04/27/22 0810  BP: 122/66  (!) 106/58   Pulse: 86  79   Resp: 17  12   Temp:   97.6 F (36.4 C)   TempSrc: Axillary  Axillary   SpO2: 97%  100% 100%  Weight:  73.3 kg    Height:        Intake/Output Summary (Last 24 hours) at 04/27/2022 1052 Last data filed at 04/27/2022 0646 Gross per 24 hour  Intake --  Output 2850 ml  Net -2850 ml   Filed Weights   04/25/22 0500 04/26/22 0500 04/27/22 0500  Weight: 78.6 kg 77.7 kg 73.3 kg    Examination:  Physical Exam: GEN: Chronically ill in appearance, alert, NAD HEENT:  Frontal craniotomy incision site noted, dry mucous membranes, PERRL PULM: Decreased breath sounds bilateral bases, no crackles/wheezing, normal respiratory effort on 6 L nasal cannula with SPO2 97% at rest CV: RRR w/o M/G/R GI: abd soft, NTND, NABS, no R/G/M MSK: No peripheral edema    Data Reviewed: I have personally reviewed following labs and imaging studies  CBC: Recent Labs  Lab 04/21/22 0157 04/22/22 0636 04/23/22 0607 04/24/22 0159 04/27/22 0327  WBC 12.5* 9.8 9.2 10.6* 13.7*  NEUTROABS 10.0* 7.3 6.7 7.6  --   HGB 9.7* 9.7* 9.3* 8.7* 9.4*  HCT 31.0* 31.7* 30.7* 28.5* 30.0*  MCV 87.6 89.5 90.0 90.2 87.2  PLT 236 257 254 251 784   Basic Metabolic Panel: Recent Labs  Lab 04/21/22 0157 04/22/22 0636 04/23/22 0607 04/24/22 0159 04/25/22 0242 04/27/22 0327  NA 147* 145 148* 146* 143 132*  K 3.5 3.2* 3.5 3.4* 4.3 3.8  CL 106 106 112* 111 109 97*  CO2 '31 29 28 27 26 25  '$ GLUCOSE 95 93 151* 156* 155* 143*  BUN 35* 27* '23 22 20 23  '$ CREATININE 1.03* 0.99 0.89 0.87 0.83 0.77  CALCIUM 8.9 8.7* 8.6* 8.4* 8.5* 8.4*  MG 2.1 2.2 2.1  --  1.9 1.9   GFR: Estimated Creatinine Clearance: 49.6 mL/min (by C-G formula based on SCr of 0.77 mg/dL). Liver Function Tests: Recent Labs  Lab 04/22/22 0636  AST 39  ALT 40  ALKPHOS 78  BILITOT 0.7  PROT 6.3*  ALBUMIN 1.9*   No results for input(s): "LIPASE", "AMYLASE" in  the last 168 hours. No results for input(s): "AMMONIA" in the last 168 hours. Coagulation Profile: No results for input(s): "INR", "PROTIME" in the last 168 hours. Cardiac Enzymes: No results for input(s): "CKTOTAL", "CKMB", "CKMBINDEX", "TROPONINI" in the last 168 hours. BNP (last 3 results) No results for input(s): "PROBNP" in the last 8760 hours. HbA1C: No results for input(s): "HGBA1C" in the last 72 hours. CBG: Recent Labs  Lab 04/26/22 0550 04/26/22 1240 04/26/22 1711 04/26/22 2308 04/27/22 0625  GLUCAP 138* 126* 127* 146* 135*   Lipid  Profile: No results for input(s): "CHOL", "HDL", "LDLCALC", "TRIG", "CHOLHDL", "LDLDIRECT" in the last 72 hours. Thyroid Function Tests: No results for input(s): "TSH", "T4TOTAL", "FREET4", "T3FREE", "THYROIDAB" in the last 72 hours. Anemia Panel: No results for input(s): "VITAMINB12", "FOLATE", "FERRITIN", "TIBC", "IRON", "RETICCTPCT" in the last 72 hours. Sepsis Labs: No results for input(s): "PROCALCITON", "LATICACIDVEN" in the last 168 hours.  No results found for this or any previous visit (from the past 240 hour(s)).        Radiology Studies: DG CHEST PORT 1 VIEW  Result Date: 04/27/2022 CLINICAL DATA:  Shortness of breath. EXAM: PORTABLE CHEST 1 VIEW COMPARISON:  04/24/2022 FINDINGS: 0730 hours. The cardio pericardial silhouette is enlarged. Calcified mediastinal and right hilar lymph nodes again noted. A feeding tube passes into the stomach although the distal tip position is not included on the film. Interstitial markings are diffusely coarsened with chronic features. Patchy airspace disease, left greater than right, is stable to minimally improved in the interval. Telemetry leads overlie the chest. IMPRESSION: Similar appearance of chronic interstitial opacity with stable to minimal improvement in patchy areas of airspace opacity bilaterally. Electronically Signed   By: Misty Stanley M.D.   On: 04/27/2022 10:32        Scheduled Meds:  amiodarone  200 mg Per Tube Daily   apixaban  5 mg Per Tube BID   budesonide (PULMICORT) nebulizer solution  0.25 mg Nebulization BID   Chlorhexidine Gluconate Cloth  6 each Topical Daily   feeding supplement (PROSource TF)  45 mL Per Tube TID   free water  300 mL Per Tube Q4H   furosemide  40 mg Per Tube Daily   insulin aspart  0-15 Units Subcutaneous Q6H   lacosamide  200 mg Per Tube BID   levETIRAcetam  1,000 mg Per Tube BID   midodrine  5 mg Per Tube BID WC   montelukast  10 mg Per Tube QHS   multivitamin with minerals  1 tablet Per  Tube Daily   ondansetron  4 mg Per Tube Q12H   mouth rinse  15 mL Mouth Rinse 4 times per day   pantoprazole sodium  40 mg Per Tube BID   polyethylene glycol  17 g Per NG tube Daily   rosuvastatin  20 mg Per Tube Daily   senna  1 tablet Per Tube BID   sertraline  25 mg Per Tube Daily   Continuous Infusions:  feeding supplement (OSMOLITE 1.5 CAL) 1,000 mL (04/26/22 1850)     LOS: 43 days    Time spent: 49 minutes spent on chart review, discussion with nursing staff, consultants, updating family and interview/physical exam; more than 50% of that time was spent in counseling and/or coordination of care.    [place] Bryngelson J [place] DO [facility] Available via Epic secure chat 7am-7pm After these hours, please refer to coverage provider listed on [email or web address] 04/27/2022, 10:52 AM

## 2022-04-27 NOTE — Progress Notes (Signed)
   Palliative Medicine Inpatient Follow Up Note  HPI: Patient is a 76 year old African-American female currently with past medical history significant for but not limited to anxiety, depression, diabetes mellitus type 2, hypertension, hyperlipidemia, cardiomyopathy, history of atrial fibrillation on anticoagulation with Eliquis, chronic kidney disease stage IIIb, history of breast cancer status post chemoradiation in 2008 as well as other comorbidities who presented to the hospital on 03/15/2022 with new onset seizures.  She had a known sphenoidal meningioma that was noted to be increased in size on the CT scan with surrounding edema so she is transferred from [facility] for further evaluation.  She underwent LTM with no further seizures and was evaluated by neurosurgery and underwent bifrontal craniotomy for meningioma resection on 03/24/2020. She has been minimally responsive since.   Has had a prolonged 36 day hospitalization.   Palliative care has been asked to get involved to discuss goals of care.   Today's Discussion 04/27/2022  *Please note that this is a verbal dictation therefore any spelling or grammatical errors are due to the "[employer]" system interpretation.  Chart reviewed inclusive of vital signs, progress notes, laboratory results, and diagnostic images.   I met with Tara [name] at bedside this morning she was being evaluated by Dr. Arnoldo Morale and able to open her eyes and respond with a one word answer though did not follow commands.   Patients spouse, Tara [name] shares that he feels Tara [name] started improving about last Sunday which took roughly four weeks. He continues to hope for improvements. He states that he has not spoken to his family about PEG tube as Dr. Christella Noa had not mentioned this. He maintains optimism for ongoing improvements.   Objective Assessment: Vital Signs Vitals:   04/27/22 0810 04/27/22 1128  BP:  (!) 104/56  Pulse:    Resp:  17  Temp:  97.9 F (36.6 C)   SpO2: 100% 93%    Intake/Output Summary (Last 24 hours) at 04/27/2022 1142 Last data filed at 04/27/2022 6144 Gross per 24 hour  Intake --  Output 1900 ml  Net -1900 ml    Last Weight  Most recent update: 04/27/2022  6:29 AM    Weight  73.3 kg (161 lb 9.6 oz)            Gen: Frail elderly African-American female HEENT: Suturing across forehead, core track in place, dry mucous membranes CV: Regular rate and rhythm, no murmurs rubs or gallops PULM: On 6LPM [place], breathing even and nonlabored ABD: soft/nontender EXT: Generalized edema Neuro: Eyes closed does not verbally respond nor follow any commands  SUMMARY OF RECOMMENDATIONS   Full Code/Full Scope of Care Allow time for outcomes Family hears from Dr. Christella Noa that he believes she can still make a full recovery  GOC is to restore Tara [name] to a state that she can return home, if not - she would not wish to die in a hospital and family would accept hospice in the home if it came to this PMT will continue to follow incrementally  ______________________________________________________________________________________ [place] Team Team Cell Phone: [phone] Please utilize secure chat with additional questions, if there is no response within 30 minutes please call the above phone number  Palliative Medicine Team providers are available by phone from 7am to 7pm daily and can be reached through the team cell phone.  Should this patient require assistance outside of these hours, please call the patient's attending physician.

## 2022-04-27 NOTE — Progress Notes (Signed)
Subjective: The patient is more alert today.  She will answer simple questions.  She is in no apparent distress.  Her husband is at the bedside.  Objective: Vital signs in last 24 hours: Temp:  [97.5 F (36.4 C)-98.1 F (36.7 C)] 97.5 F (36.4 C) (08/05 2306) Pulse Rate:  [74-94] 86 (08/06 0308) Resp:  [15-22] 17 (08/06 0308) BP: (105-130)/(61-66) 122/66 (08/06 0308) SpO2:  [91 %-100 %] 100 % (08/06 0810) Weight:  [73.3 kg] 73.3 kg (08/06 0500) Estimated body mass index is 34.97 kg/m as calculated from the following:   Height as of this encounter: '4\' 9"'$  (1.448 m).   Weight as of this encounter: 73.3 kg.   Intake/Output from previous day: 08/05 0701 - 08/06 0700 In: -  Out: 2850 [Urine:2850] Intake/Output this shift: No intake/output data recorded.  Physical exam the patient is more alert.  She will open her eyes and answer simple questions.  She does not quite follow commands.  Lab Results: Recent Labs    04/27/22 0327  WBC 13.7*  HGB 9.4*  HCT 30.0*  PLT 267   BMET Recent Labs    04/25/22 0242 04/27/22 0327  NA 143 132*  K 4.3 3.8  CL 109 97*  CO2 26 25  GLUCOSE 155* 143*  BUN 20 23  CREATININE 0.83 0.77  CALCIUM 8.5* 8.4*    Studies/Results: No results found.  Assessment/Plan: Postop day #43: Continue supportive care.  LOS: 43 days     Tara [name] 04/27/2022, 8:12 AM     Patient ID: Tara [name], female   DOB: 1945-10-31, 76 y.o.   MRN: [id number]

## 2022-04-28 DIAGNOSIS — R569 Unspecified convulsions: Secondary | ICD-10-CM | POA: Diagnosis not present

## 2022-04-28 LAB — GLUCOSE, CAPILLARY
Glucose-Capillary: 118 mg/dL — ABNORMAL HIGH (ref 70–99)
Glucose-Capillary: 141 mg/dL — ABNORMAL HIGH (ref 70–99)
Glucose-Capillary: 153 mg/dL — ABNORMAL HIGH (ref 70–99)
Glucose-Capillary: 166 mg/dL — ABNORMAL HIGH (ref 70–99)

## 2022-04-28 NOTE — Progress Notes (Signed)
Physical Therapy Treatment Patient Details Name: Tara [name] MRN: [id number] DOB: 15-Feb-1946 Today's Date: 04/28/2022   History of Present Illness 76 yo woman admitted 03/15/22 with new onset seizures related to meningioma. s/p tumor resection 7/3. Recent DC from [facility] 6/22. MRI on 7/26 shows: SDH extending inferiorly along the clivus and  narrowing the foramen magnum with possible mass effect. PMH: CKD stage 3, HFpEF, a fib, meningioma.    PT Comments    The pt presents with improved alertness and eyes opened for this session. Family is present and encouraged by pt being more alert. The pt did vocalize x1 in session, stating "alright" when asked how she was, and made small movement with RLE x1 to command with max cues and tactile stimulation, otherwise no attempts at communication or command following were noted this session. The pt presents with L lateral lean in sitting and needed minA to maintain upright, dependent on assist from therapists to complete stand and pivot transfers OOB. The pt relies on blocking of bilateral knees, assist at hips with use of pads, and totalA for trunk support. The pt continues to make very slow progress, will continue to benefit from skilled PT to continue progress with mobility and participation.    Recommendations for follow up therapy are one component of a multi-disciplinary discharge planning process, led by the attending physician.  Recommendations may be updated based on patient status, additional functional criteria and insurance authorization.  Follow Up Recommendations  Skilled nursing-short term rehab (<3 hours/day) Can patient physically be transported by private vehicle: No   Assistance Recommended at Discharge Frequent or constant Supervision/Assistance  Patient can return home with the following Assistance with cooking/housework;Assist for transportation;Direct supervision/assist for financial management;Two people to help with walking and/or  transfers;Two people to help with bathing/dressing/bathroom;Help with stairs or ramp for entrance   Equipment Recommendations  Wheelchair (measurements PT);Wheelchair cushion (measurements PT);Hospital bed;Other (comment)    Recommendations for Other Services       Precautions / Restrictions Precautions Precautions: Fall Precaution Comments: seizures, multiple lines, cortrak Restrictions Weight Bearing Restrictions: No     Mobility  Bed Mobility Overal bed mobility: Needs Assistance Bed Mobility: Rolling, Sidelying to Sit Rolling: Total assist Sidelying to sit: Total assist       General bed mobility comments: pt with no attempt to assist with transition to sitting. initially maxA to maintain static sitting but improved with improved alertness    Transfers Overall transfer level: Needs assistance Equipment used: 2 person hand held assist Transfers: Sit to/from Stand, Bed to chair/wheelchair/BSC Sit to Stand: Total assist, +2 physical assistance     Squat pivot transfers: Total assist, +2 physical assistance     General transfer comment: completed sit-stand x2 with totalA of 2 and use of bed pad to assist with hip extension. pt with poor postural stength and remains dependent on therapists. totalA to pivot to recliner    Ambulation/Gait               General Gait Details: unable to manage, total A to maintain static stance    Modified Rankin (Stroke Patients Only) Modified Rankin (Stroke Patients Only) Pre-Morbid Rankin Score: Moderately severe disability Modified Rankin: Severe disability     Balance Overall balance assessment: Needs assistance Sitting-balance support: Feet supported Sitting balance-Leahy Scale: Poor Sitting balance - Comments: initially dependent on assist, progressed to moments of minG with continued activity sitting EOB Postural control: Posterior lean, Left lateral lean, Right lateral lean Standing balance support: Bilateral  upper  extremity supported, During functional activity Standing balance-Leahy Scale: Zero Standing balance comment: dependent on +2 assist to rise and maintain standing                            Cognition Arousal/Alertness: Awake/alert Behavior During Therapy: Flat affect Overall Cognitive Status: Impaired/Different from baseline                                 General Comments: pt in bed with eyes open upon arrival, maintained through session. able to answer x1 question with 1-word answer "alright" and only followed single command to RLE, but did not repeat when asked        Exercises General Exercises - Lower Extremity Ankle Circles/Pumps: PROM, Both, 5 reps, Supine Long Arc Quad: PROM, Both, 5 reps, Seated    General Comments General comments (skin integrity, edema, etc.): VSS on 5L      Pertinent Vitals/Pain Pain Assessment Pain Assessment: Faces Faces Pain Scale: No hurt Breathing: normal Negative Vocalization: none Facial Expression: smiling or inexpressive Body Language: relaxed Consolability: no need to console PAINAD Score: 0 Pain Intervention(s): Monitored during session     PT Goals (current goals can now be found in the care plan section) Acute Rehab PT Goals Patient Stated Goal: per family, to return to independence, plan to wait 2 weeks to see improvement PT Goal Formulation: With family Time For Goal Achievement: 04/30/22 Potential to Achieve Goals: Fair Progress towards PT goals: Progressing toward goals    Frequency    Min 3X/week      PT Plan Current plan remains appropriate    Co-evaluation PT/OT/SLP Co-Evaluation/Treatment: Yes Reason for Co-Treatment: Complexity of the patient's impairments (multi-system involvement);Necessary to address cognition/behavior during functional activity;For patient/therapist safety;To address functional/ADL transfers PT goals addressed during session: Mobility/safety with  mobility;Balance;Strengthening/ROM        AM-PAC PT "6 Clicks" Mobility   Outcome Measure  Help needed turning from your back to your side while in a flat bed without using bedrails?: Total Help needed moving from lying on your back to sitting on the side of a flat bed without using bedrails?: Total Help needed moving to and from a bed to a chair (including a wheelchair)?: Total Help needed standing up from a chair using your arms (e.g., wheelchair or bedside chair)?: Total Help needed to walk in hospital room?: Total Help needed climbing 3-5 steps with a railing? : Total 6 Click Score: 6    End of Session Equipment Utilized During Treatment: Gait belt;Oxygen Activity Tolerance: Patient tolerated treatment well Patient left: in chair;with call bell/phone within reach;with chair alarm set;with family/visitor present Nurse Communication: Mobility status;Need for lift equipment PT Visit Diagnosis: Other abnormalities of gait and mobility (R26.89);Other symptoms and signs involving the nervous system (R29.898)     Time: 8099-8338 PT Time Calculation (min) (ACUTE ONLY): 33 min  Charges:  $Therapeutic Activity: 8-22 mins                     West Carbo, PT, DPT   Acute Rehabilitation Department   Sandra Cockayne 04/28/2022, 12:25 PM

## 2022-04-28 NOTE — Progress Notes (Signed)
Occupational Therapy Treatment Patient Details Name: Tara [name] MRN: [id number] DOB: 11/29/1945 Today's Date: 04/28/2022   History of present illness 76 yo woman admitted 03/15/22 with new onset seizures related to meningioma. s/p tumor resection 7/3. Recent DC from [facility] 6/22. MRI on 7/26 shows: SDH extending inferiorly along the clivus and  narrowing the foramen magnum with possible mass effect. PMH: CKD stage 3, HFpEF, a fib, meningioma.   OT comments  Tamelia was seen with PT this date. Overall pt demonstrated increased arousal, attention and activity tolerance this date. Navdeep's eyes were open 100% of the session, and she was tracking stimuli within the room about 50% of the time. She made 1x appropriate vocalization to a question, otherwise no other attempts. She required total A +2 for bed mobility and SP transfer to the chair, noted to have a L lateral lean throughout. Pt help BUEs in an internally rotated flexed positioning throughout, able to get full PROM with increased time and effort. R digits are fisted with mild MRSD noted - she will benefit from a R palm guard. POC remains appropriate.    Recommendations for follow up therapy are one component of a multi-disciplinary discharge planning process, led by the attending physician.  Recommendations may be updated based on patient status, additional functional criteria and insurance authorization.    Follow Up Recommendations  Skilled nursing-short term rehab (<3 hours/day)    Assistance Recommended at Discharge Frequent or constant Supervision/Assistance  Patient can return home with the following  Two people to help with walking and/or transfers;Two people to help with bathing/dressing/bathroom;Assistance with cooking/housework;Assistance with feeding;Direct supervision/assist for medications management;Direct supervision/assist for financial management;Assist for transportation;Help with stairs or ramp for entrance   Equipment  Recommendations  Other (comment)    Recommendations for Other Services Other (comment)    Precautions / Restrictions Precautions Precautions: Fall Precaution Comments: seizures, multiple lines, cortrak Restrictions Weight Bearing Restrictions: No       Mobility Bed Mobility Overal bed mobility: Needs Assistance Bed Mobility: Rolling, Sidelying to Sit Rolling: Total assist Sidelying to sit: Total assist       General bed mobility comments: pt with no attempt to assist with transition to sitting. initially maxA to maintain static sitting but improved with improved alertness    Transfers Overall transfer level: Needs assistance Equipment used: 2 person hand held assist Transfers: Sit to/from Stand, Bed to chair/wheelchair/BSC Sit to Stand: Total assist, +2 physical assistance   Squat pivot transfers: Total assist, +2 physical assistance       General transfer comment: completed sit-stand x2 with totalA of 2 and use of bed pad to assist with hip extension. pt with poor postural stength and remains dependent on therapists. totalA to pivot to recliner     Balance Overall balance assessment: Needs assistance Sitting-balance support: Feet supported Sitting balance-Leahy Scale: Poor Sitting balance - Comments: initially dependent on assist, progressed to moments of minG with continued activity sitting EOB Postural control: Posterior lean, Left lateral lean, Right lateral lean Standing balance support: Bilateral upper extremity supported, During functional activity Standing balance-Leahy Scale: Zero Standing balance comment: dependent on +2 assist to rise and maintain standing                           ADL either performed or assessed with clinical judgement   ADL Overall ADL's : Needs assistance/impaired  Toilet Transfer: +2 for physical assistance;Total assistance;+2 for safety/equipment;Stand-pivot Toilet Transfer Details  (indicate cue type and reason): simulated from bed>chair         Functional mobility during ADLs: Total assistance;+2 for safety/equipment;+2 for physical assistance General ADL Comments: demonstrated incrased arousal and attention, continues to have L lateral lean    Extremity/Trunk Assessment Upper Extremity Assessment Upper Extremity Assessment: RUE deficits/detail;LUE deficits/detail RUE Deficits / Details: Tendency for internal rotation and flexion. Able to achieve full PROM and into extension. Fisted with minimal MRSD in palm - will recommend palm guard RUE Coordination: decreased fine motor;decreased gross motor LUE Deficits / Details: also protected in internally flexed position, required increased time and effort to PROM into full extension. No purposedful activation noted LUE Coordination: decreased fine motor;decreased gross motor   Lower Extremity Assessment Lower Extremity Assessment: Defer to PT evaluation        Vision   Vision Assessment?: Vision impaired- to be further tested in functional context Eye Alignment: Impaired (comment) Ocular Range of Motion: Impaired-to be further tested in functional context Alignment/Gaze Preference: Gaze right Tracking/Visual Pursuits: Impaired - to be further tested in functional context Additional Comments: gaze R/midline, not many attempts on tracking to the L. 50% of the time maving eyes toward stimuli   Perception Perception Perception: Not tested   Praxis Praxis Praxis: Not tested    Cognition Arousal/Alertness: Awake/alert Behavior During Therapy: Flat affect Overall Cognitive Status: Impaired/Different from baseline                                 General Comments: pt in bed with eyes open upon arrival, maintained through session. able to answer x1 question with 1-word answer "alright" and only followed single command to RLE, but did not repeat when asked        Exercises Other Exercises Other  Exercises: PROM of bilat hands x 5 Other Exercises: PROM of bilat elbows into extension x5 with 5 second holds Other Exercises: PROM of bilat forearm supination x5 with 5 second holds    Shoulder Instructions       General Comments VSS on 5L, 2 sisters present    Pertinent Vitals/ Pain       Pain Assessment Pain Assessment: Faces Pain Score: 0-No pain Pain Intervention(s): Monitored during session  Home Living                                          Prior Functioning/Environment              Frequency  Min 2X/week        Progress Toward Goals  OT Goals(current goals can now be found in the care plan section)  Progress towards OT goals: Progressing toward goals  Acute Rehab OT Goals Patient Stated Goal: unable OT Goal Formulation: Patient unable to participate in goal setting Time For Goal Achievement: 04/30/22 Potential to Achieve Goals: Fair ADL Goals Pt Will Perform Grooming: with max assist;sitting Pt Will Perform Upper Body Bathing: with max assist;sitting Pt Will Perform Lower Body Bathing: with max assist;bed level Pt Will Perform Lower Body Dressing: with modified independence;sitting/lateral leans;sit to/from stand Pt Will Transfer to Toilet: with max assist;with +2 assist;bedside commode Pt Will Perform Toileting - Clothing Manipulation and hygiene: with modified independence;sitting/lateral leans;sit to/from stand Additional ADL Goal #1: Pt will  track to midline to find family members with moderate directional cues and max assist to turn head  Plan Discharge plan remains appropriate;Frequency remains appropriate    Co-evaluation    PT/OT/SLP Co-Evaluation/Treatment: Yes Reason for Co-Treatment: Complexity of the patient's impairments (multi-system involvement);For patient/therapist safety;To address functional/ADL transfers PT goals addressed during session: Mobility/safety with mobility;Balance;Strengthening/ROM OT goals  addressed during session: ADL's and self-care      AM-PAC OT "6 Clicks" Daily Activity     Outcome Measure   Help from another person eating meals?: Total Help from another person taking care of personal grooming?: Total Help from another person toileting, which includes using toliet, bedpan, or urinal?: Total Help from another person bathing (including washing, rinsing, drying)?: Total Help from another person to put on and taking off regular upper body clothing?: Total Help from another person to put on and taking off regular lower body clothing?: Total 6 Click Score: 6    End of Session Equipment Utilized During Treatment: Oxygen;Gait belt  OT Visit Diagnosis: Unsteadiness on feet (R26.81);Other abnormalities of gait and mobility (R26.89);Muscle weakness (generalized) (M62.81);Low vision, both eyes (H54.2);Other symptoms and signs involving cognitive function;Pain   Activity Tolerance Patient tolerated treatment well   Patient Left in chair;with call bell/phone within reach;with chair alarm set;with family/visitor present   Nurse Communication Mobility status        Time: 1173-5670 OT Time Calculation (min): 36 min  Charges: OT General Charges $OT Visit: 1 Visit OT Treatments $Therapeutic Activity: 8-22 mins    Wymon Swaney A Lashun Mccants 04/28/2022, 1:54 PM

## 2022-04-28 NOTE — Progress Notes (Signed)
Patient ID: Tara [name], female   DOB: Feb 28, 1946, 76 y.o.   MRN: [id number] BP 114/68 (BP Location: Left Arm)   Pulse 86   Temp (!) 97.2 F (36.2 C)   Resp 20   Ht '4\' 9"'$  (1.448 m)   Wt 75.2 kg   SpO2 96%   BMI 35.88 kg/m  Alert, speaking, oriented x 2 Moving all extremities Significant improvement this weekend. Wound is clean, and dry With rapid improvement currently she may qualify for inpatient rehab.  Continue with pt/ot Swallow tomorrow

## 2022-04-28 NOTE — Progress Notes (Signed)
Speech Language Pathology Treatment: Dysphagia  Patient Details Name: GRADIE BUTRICK MRN: [id number] DOB: Mar 27, 1946 Today's Date: 04/28/2022 Time: 1155-2080 SLP Time Calculation (min) (ACUTE ONLY): 26 min  Assessment / Plan / Recommendation Clinical Impression  Pt seen for ongoing dysphagia management.  Pt exhibiting a significant improvement in overall level of alertness.  Pt seated upright in chair with family present for session.  Lip smack observed consistently.  Pt briefly stops this behavior with PO trials.  Pt drank serial straw sips of thin liquid with single, dry cough over multiple trials. Pt consumed ~8oz of water with SLP.  With puree, pt exhibited prompt oral response. There were no clinical s/s of aspiration.  Pt consumed entire 4 oz container of applesauce.  With trial of regular solid, pt had difficulty biting cracker but was eventually able to bite through and exhibited adequate oral clearance with additional time.  Lip smacking behavior may impact oral phase.  There were no clinical s/s of aspiration.  Family report some concern for aspiration, and most recent CXR from 8/6 reveals "Similar appearance of chronic interstitial opacity with stable to minimal improvement in patchy areas of airspace opacity bilaterally".  Given prolonged NPO status and concern for recent aspiration, recommend MBSS prior to resuming PO diet.  Pt may have some puree snacks from floor stock and sips of thin liquid by straw when awake/alert, with upright positioning pending results of MBS planned for next date.  Recommend pt remain NPO with alternate means of nutrition, hydration, and medication, except for snacks as noted above, pending results of instrumental assessment.   HPI HPI: Pt is a 76 y/o who presented 6/24 with new onset seizures. 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 EEG negative for seizures. Neurosurgey consulted and did not recommend meningioma  resection as of 6/27. Decline in swallow function noted overnight 6/27. Dx acute toxic metabolic encephalopathy suspected secondary to keppra. Pt s/p Bifrontal craniotomy for resection of meningioma 7/3.  Cortrak 7/7. 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  CXR 8/6 concerning for patchy airspace disease. PMH: HTN, HLD, GERD, CKD stage IIIb, chronic HFpEF, meningioma, breast CA-s/p right lumpectomy and chemoradiation in 2008, depression/anxiety. BSE 03/11/22: regular texture diet and thin liquids without need for follow up.      SLP Plan  MBS      Recommendations for follow up therapy are one component of a multi-disciplinary discharge planning process, led by the attending physician.  Recommendations may be updated based on patient status, additional functional criteria and insurance authorization.    Recommendations  Diet recommendations: NPO (May have puree snacks from floor stock and sips of thin liquid by straw pending results of MBS) Liquids provided via: Straw Medication Administration: Via alternative means Postural Changes and/or Swallow Maneuvers: Seated upright 90 degrees                Oral Care Recommendations: Oral care QID Follow Up Recommendations: Skilled nursing-short term rehab (<3 hours/day) Assistance recommended at discharge: Frequent or constant Supervision/Assistance Plan: [place], [place], [place] Office: [phone] 04/28/2022, 11:21 AM

## 2022-04-28 NOTE — Progress Notes (Signed)
PROGRESS NOTE    Tara [name]  ELF:[id number] DOB: [birth year] DOA: 03/15/2022 PCP: [name], MD    Brief Narrative:   Tara [name] is a 76 y.o. female with past medical history significant for anxiety/depression, type 2 diabetes mellitus, hyperlipidemia, paroxysmal atrial fibrillation on Eliquis, essential hypertension, nonischemic cardiomyopathy, CKD stage IIIb, breast cancer s/p chemoradiation, history of sphenoidal meningioma who presented to [facility] ED on 6/24 with new onset seizures.    Patient recently discharged on 03/12/2022 after she presented for weakness, fatigue, confusion and slurred speech.  She was initially admitted to the intensive care unit with acute renal failure, electrolyte disturbance and A-fib with RVR with hypotension.  She was started on amiodarone infusion at that time and CT head during that admission showed interval increase in the size of her previously noted meningioma.  Patient was hydrated with improvement of renal function and discharged home with Decadron taper and follow-up with neurosurgery in 7-10 days.  Family reports that she never returned to her baseline following that admission.  In the ED, CT head with no acute process with stable sphenoidal meningioma measuring 2.4 x 3.3 x 2.8 cm with hypodense lesion in the frontal lobe medially to the adjacent mass representing edema/infarct.  Neurosurgery was consulted and patient was transferred to [facility] for further evaluation and management under the hospitalist service.  Significant Hospital Events: 6/24 presented to [facility] ED with new onset seizures, CTH with enlarging meningioma with edema, loaded with Keppra 7/3 Underwent bifrontal craniotomy for meningioma 7/6 CTH with new 7 mm thick subdural hematoma at the foramen magnum and tracking adjacent to the basion and odontoid not currently causing critical stenosis of the foramen magnum per radiology read, but merits surveillance. 7/11  recurrent sz like activity. Not correlated on EEG. CT unchanged.  7/13 neurology signed off. 7/21 palliative care consulted. 7/23 CT chest and abdomen was performed negative for PE or significant pneumonia, without any hemorrhage 7/25 MRI brain without contrast shows increasing mass effect, SDH as well as subacute/acute small scattered infarct. 7/26 tube feeds resumed, neurology reconsulted.  Eliquis resumed per neurosurgery 7/27 oxygenation improving, mentation improving, neurology following.  CTA head and neck without any LVO.  Lower extremity Doppler without any DVT.  Echo shows new systolic dysfunction with global hypokinesis EF 40 to 45% and significant RV failure. 7/29 oxygenation improving to 6 L saturating 94%. 7/30 had another aspiration event. Tube feeds on hold. Now on NRB. Remains full code. No tracheostomy per husband. Per Neurosurgery, give at least two weeks if not more to see if she improves.  8/1 oxygenation improving again to 5 L.  Trickle tube feeds started.  Zofran and PPI scheduled twice daily. 8/2: No significant change, husband reports patient responding occasionally to verbal stimuli 8/4: Patient worked with therapy more yesterday, was able to sit at edge of bed and stand with moderate assistance x2, husband reporting that she has been more verbal, slowly improving 8/5: More somnolent today, but awakes to command with mumbling speech.  Did not work with therapy yesterday. 8/7: Husband reports increased lipsmacking today, although patient alert and appropriate to verbal command.  Remains with slow improvement.  No other significant changes  Assessment & Plan:   Sphenoidal meningioma with vasogenic edema Seizure Subdural hemorrhage along [place] Subarachnoid hemorrhage and left occipital horn without hydrocephalus Narrowing foramen magnum due to SDH Patient presenting as a transfer from [facility] following tonic-clonic seizure at home.  Patient with known history  of meningioma with interval increase.  Neurosurgery was consulted and patient underwent bifrontal craniotomy for meningioma resection on 03/24/2022.  MRI brain 7/25 with evidence of SDH and SAH.  Neurology was initially consulted and patient underwent multiple EEGs and LTM while inpatient, last EEG 7/28 negative for any acute seizures. --Palliative care following, appreciate assistance --Apixaban resumed 7/28 --Vimpat 200 mg per tube twice daily --Keppra 1000 mg per tube twice daily --Per neurosurgery, will at least 2 weeks from 7/30 to assess improvement --Further management per neurosurgery  Sepsis in the setting of HCAP and recurrent aspiration pneumonia Acute hypoxic respiratory failure Developed fever of 100.9 on 7/23, COVID-19 PCR negative.  Chest x-ray with new hazy opacities projecting over both upper lobes in the suprahilar regions consistent with developing infection.  CT chest, abdomen/pelvis with no PE, multifocal groundglass opacities likely pulmonary edema.  Completed course of Unasyn. --MRSA PCR negative. 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  Repeat TTE 04/17/2022 with EF 40-45% with global hypokinesis, RV function also moderately low. --net negative 11L since admission --wt 85>>>73.3kg --Strict I's and O's Daily weights --Titrate oxygen to maintain SPO2 >92%; on 6L [place] w/ SpO2 96% at rest --Lasix 40 mg per tube daily --Intermittent monitoring of renal function  Acute embolic CVA, incidental Patient with known history of atrial fibrillation, MRI brain with  evidence of restricted effusion left cerebellum, bilateral occipital lobe, left parietal lobe, bilateral frontal lobe; concerning for small scattered acute and subacute infarct most likely embolic in the setting of history of A-fib.  Repeat TTE showed mildly degraded LVEF of 40-45%.  Eliquis was initially held due to/SAH and need for meningioma resection as above but now has been resumed.  LDL 106 April 2023.  Neurology was initially consulted but now signed off. --SLP, remain n.p.o. with cortrak in place and tube feeds initiated --Continue PT/OT efforts while inpatient, currently recommending SNF --Crestor 20 mg per tube daily  Postoperative acute blood loss anemia, expected Iron deficiency anemia Acute on chronic thrombocytopenia Patient was transfused 2 units of PRBCs on 7/22 for hemoglobin drop of 6.8.  Anemia panel with iron 15, sats 8, ferritin 440, vitamin B12 356. --Hgb  9.3>>>6.8>>7.7>8.3>8.7>9.4 --Plt 155>>>85>>81>>>257>254>251 --Continue intermittent monitoring of CBC  Dysphagia with recurrent aspiration Remains n.p.o. per recommendations of speech therapy.  Core track placed on 03/28/2022. --Continue tube feeds --If no further improvement, will need to consider PEG/G-tube placement if goals of care remain aggressive  Hypokalemia Repleted, continue intermittent monitoring of electrolytes  Hyponatremia: Resolved Continue intermittent monitoring of electrolytes  Anxiety/depression --Sertraline 25 mg per tube daily  Type 2 diabetes mellitus Hemogram A1C 5.9, well controlled. --SSI for coverage  Paroxysmal atrial fibrillation --Amiodarone 20 mg per tube daily --Apixaban 5 mg per tube twice daily  History of essential hypertension Now with hypotension --Midodrine 5 mg per tube BID  GERD: Protonix 40 mg per tube daily  Pressure ulcer left buttock stage II, not POA Pressure Injury 04/07/22 Buttocks Left Stage 2 -  Partial thickness loss of dermis presenting as a shallow  open injury with a red, pink wound bed without slough. (Active)  04/07/22 0100  Location: Buttocks  Location  Orientation: Left  Staging: Stage 2 -  Partial thickness loss of dermis presenting as a shallow open injury with a red, pink wound bed without slough.  Wound Description (Comments):   Present on Admission: No  --Continue wound care, frequent turning/offloading  Obesity Body mass index is 35.88 kg/m.  Discussed with patient needs for aggressive lifestyle changes/weight loss as this complicates all facets of care.  Outpatient follow-up with PCP.    DVT prophylaxis: SCDs Start: 03/26/22 0840 Place TED hose Start: 03/24/22 0616 Place and maintain sequential compression device Start: 03/23/22 0631 apixaban (ELIQUIS) tablet 5 mg    Code Status: Full Code Family Communication:   Disposition Plan:  Level of care: Progressive Status is: Inpatient Remains inpatient appropriate because: Per primary, neurosurgery, PT/OT currently recommending SNF placement     Procedures:  EEG 7/2, 7/7, 7/8, 7/11 Bifrontal craniotomy for resection of meningioma, Dr. Christella Noa 7/3  Antimicrobials:  Vancomycin 7/19 - 7/20 Cefepime 7/19 - 7/21 Cefazolin 7/3 - 7/3 Unasyn 7/7 - 7/10, 7/21 - 8/4   Subjective: Patient seen examined bedside, lying in bed.  Husband present.  Husband reports increased lipsmacking, patient responding appropriately to verbal command.  Continues with very slow progress/improvement.  Patient denies chest pain, no shortness of breath, no abdominal pain.  No acute concerns overnight per nursing staff.   Objective: Vitals:   04/27/22 2335 04/28/22 0333 04/28/22 0842 04/28/22 0900  BP: (!) 106/57 (!) 110/58    Pulse: 90 92    Resp: (!) 22 18    Temp: 98.6 F (37 C) 98.4 F (36.9 C)  98.4 F (36.9 C)  TempSrc: Oral Axillary  Oral  SpO2: 98% 96% 96%   Weight:  75.2 kg    Height:        Intake/Output Summary (Last 24 hours) at 04/28/2022 1048 Last data filed at 04/28/2022  0350 Gross per 24 hour  Intake --  Output 1650 ml  Net -1650 ml   Filed Weights   04/26/22 0500 04/27/22 0500 04/28/22 0333  Weight: 77.7 kg 73.3 kg 75.2 kg    Examination:  Physical Exam: GEN: Chronically ill in appearance, alert, NAD HEENT: Frontal craniotomy incision site noted, dry mucous membranes, PERRL, cortak tube noted in place PULM: Decreased breath sounds bilateral bases, no crackles/wheezing, normal respiratory effort on 6 L nasal cannula with SPO2 96% at rest CV: RRR w/o M/G/R GI: abd soft, NTND, NABS, no R/G/M MSK: No peripheral edema    Data Reviewed: I have personally reviewed following labs and imaging studies  CBC: Recent Labs  Lab 04/22/22 0636 04/23/22 0607 04/24/22 0159 04/27/22 0327  WBC 9.8 9.2 10.6* 13.7*  NEUTROABS 7.3 6.7 7.6  --   HGB 9.7* 9.3* 8.7* 9.4*  HCT 31.7* 30.7* 28.5* 30.0*  MCV 89.5 90.0 90.2 87.2  PLT 257 254 251 093   Basic Metabolic Panel: Recent Labs  Lab 04/22/22 0636 04/23/22 0607 04/24/22 0159 04/25/22 0242 04/27/22 0327  NA 145 148* 146* 143 132*  K 3.2* 3.5 3.4* 4.3 3.8  CL 106 112* 111 109 97*  CO2 '29 28 27 26 25  '$ GLUCOSE 93 151* 156* 155* 143*  BUN 27* '23 22 20 23  '$ CREATININE 0.99 0.89 0.87 0.83 0.77  CALCIUM 8.7* 8.6* 8.4* 8.5* 8.4*  MG 2.2 2.1  --  1.9 1.9   GFR: Estimated Creatinine Clearance: 50.2 mL/min (by C-G formula based on SCr of 0.77 mg/dL). Liver Function Tests: Recent Labs  Lab 04/22/22 0636  AST 39  ALT 40  ALKPHOS 78  BILITOT 0.7  PROT 6.3*  ALBUMIN 1.9*   No results for input(s): "LIPASE", "AMYLASE" in the last 168 hours. No results for input(s): "AMMONIA" in the last 168 hours. Coagulation Profile: No results for input(s): "INR", "PROTIME" in the last 168 hours. Cardiac Enzymes: No results for input(s): "CKTOTAL", "CKMB", "CKMBINDEX", "TROPONINI" in the last 168 hours. BNP (last 3 results) No results for input(s): "PROBNP" in the last 8760 hours. HbA1C: No results for  input(s): "HGBA1C" in the last 72 hours. CBG: Recent Labs  Lab 04/27/22 0625 04/27/22 1223 04/27/22 1718 04/27/22 2343 04/28/22 0525  GLUCAP 135* 128* 132* 137* 141*   Lipid Profile: No results for input(s): "CHOL", "HDL", "LDLCALC", "TRIG", "CHOLHDL", "LDLDIRECT" in the last 72 hours. Thyroid Function Tests: No results for input(s): "TSH", "T4TOTAL", "FREET4", "T3FREE", "THYROIDAB" in the last 72 hours. Anemia Panel: No results for input(s): "VITAMINB12", "FOLATE", "FERRITIN", "TIBC", "IRON", "RETICCTPCT" in the last 72 hours. Sepsis Labs: No results for input(s): "PROCALCITON", "LATICACIDVEN" in the last 168 hours.  No results found for this or any previous visit (from the past 240 hour(s)).        Radiology Studies: DG CHEST PORT 1 VIEW  Result Date: 04/27/2022 CLINICAL DATA:  Shortness of breath. EXAM: PORTABLE CHEST 1 VIEW COMPARISON:  04/24/2022 FINDINGS: 0730 hours. The cardio pericardial silhouette is enlarged. Calcified mediastinal and right hilar lymph nodes again noted. A feeding tube passes into the stomach although the distal tip position is not included on the film. Interstitial markings are diffusely coarsened with chronic features. Patchy airspace disease, left greater than right, is stable to minimally improved in the interval. Telemetry leads overlie the chest. IMPRESSION: Similar appearance of chronic interstitial opacity with stable to minimal improvement in patchy areas of airspace opacity bilaterally. Electronically Signed   By: Misty Stanley M.D.   On: 04/27/2022 10:32        Scheduled Meds:  amiodarone  200 mg Per Tube Daily   apixaban  5 mg Per Tube BID   budesonide (PULMICORT) nebulizer solution  0.25 mg Nebulization BID   Chlorhexidine Gluconate Cloth  6 each Topical Daily   feeding supplement (PROSource TF)  45 mL Per Tube TID   free water  300 mL Per Tube Q4H   furosemide  40 mg Per Tube Daily   insulin aspart  0-15 Units Subcutaneous Q6H    lacosamide  200 mg Per Tube BID   levETIRAcetam  1,000 mg Per Tube BID   midodrine  5 mg Per Tube BID WC   montelukast  10 mg Per Tube QHS   multivitamin with minerals  1 tablet Per Tube Daily   ondansetron  4 mg Per Tube Q12H   mouth rinse  15 mL Mouth Rinse 4 times per day   pantoprazole sodium  40 mg Per Tube BID   polyethylene glycol  17 g Per NG tube Daily   rosuvastatin  20 mg Per Tube Daily   senna  1 tablet Per Tube BID   sertraline  25 mg Per Tube Daily   Continuous Infusions:  feeding supplement (OSMOLITE 1.5 CAL) 1,000 mL (04/26/22 1850)     LOS: 44 days    Time spent: 49 minutes spent on chart review, discussion with nursing staff, consultants, updating family and interview/physical exam; more than 50% of that time was spent in counseling and/or coordination of care.    Ayianna Darnold J [place] DO [facility] Available via Epic secure chat 7am-7pm  After these hours, please refer to coverage provider listed on [email or web address] 04/28/2022, 10:48 AM

## 2022-04-29 ENCOUNTER — Inpatient Hospital Stay (HOSPITAL_COMMUNITY): Payer: Medicare Other

## 2022-04-29 LAB — GLUCOSE, CAPILLARY
Glucose-Capillary: 140 mg/dL — ABNORMAL HIGH (ref 70–99)
Glucose-Capillary: 165 mg/dL — ABNORMAL HIGH (ref 70–99)
Glucose-Capillary: 141 mg/dL — ABNORMAL HIGH (ref 70–99)

## 2022-04-29 MED ORDER — OSMOLITE 1.5 CAL PO LIQD
960.0000 mL | ORAL | Status: DC
Start: 2022-04-29 — End: 2022-05-02
  Administered 2022-04-29 – 2022-05-01 (×3): 960 mL
  Filled 2022-04-29 (×3): qty 1000

## 2022-04-29 NOTE — Progress Notes (Signed)
Modified Barium Swallow Progress Note  Patient Details  Name: Tara [name] MRN: [id number] Date of Birth: 1946-04-28  Today's Date: 04/29/2022  Modified Barium Swallow completed.  Full report located under Chart Review in the Imaging Section.  Brief recommendations include the following:  Clinical Impression  Pt exhibited mild oral and moderate pharyngeal dysphagia marked by decreased timing in laryngeal closure, decreased tongue base retraction and pharyngeal contraction. She required additional time to manipulate and transit boluses primarily with soft cereal bar (Dys 2 texture). Laryngeal closure was delayed with nectar resulting in penetration (PAS 2, 3). Vallecular and pyriform sinus residue present across textures min-max given reduced tongue base retraction and pharyngeal contraction. She sensed residue and given time, consistently performed spontaneous swallow to effectively decrease to minimal. Thin and minimal amount of honey thick were aspirated after the swallow from residue (PAS 8). Unable to produce cough in attempts to clear aspirates. She was awake, kept eyes closed, not verbally communicative with therapist or able to effectively follow comands during study. Recommend initiate puree (Dys 1), honey thick liquids, crush meds, allow EXTRA TIME for pt to SWALLOW TWICE, small bits/sips in upright position. She was able to extract honey thick via straw. Therapist will continue to follow.   Swallow Evaluation Recommendations       SLP Diet Recommendations: Dysphagia 1 (Puree) solids;Honey thick liquids   Liquid Administration via: Straw;Cup   Medication Administration: Crushed with puree   Supervision: Staff to assist with self feeding;Full supervision/cueing for compensatory strategies   Compensations: Minimize environmental distractions;Slow rate;Small sips/bites;Multiple dry swallows after each bite/sip;Clear throat intermittently   Postural Changes: Seated upright at 90  degrees   Oral Care Recommendations: Oral care BID        Houston Siren 04/29/2022,12:02 PM

## 2022-04-29 NOTE — Progress Notes (Signed)
PROGRESS NOTE    Tara [name]  QPY:[id number] DOB: 1945-12-06 DOA: 03/15/2022 PCP: [name], MD    Brief Narrative:   Tara [name] is a 76 y.o. female with past medical history significant for anxiety/depression, type 2 diabetes mellitus, hyperlipidemia, paroxysmal atrial fibrillation on Eliquis, essential hypertension, nonischemic cardiomyopathy, CKD stage IIIb, breast cancer s/p chemoradiation, history of sphenoidal meningioma who presented to [facility] ED on 6/24 with new onset seizures.    Patient recently discharged on 03/12/2022 after she presented for weakness, fatigue, confusion and slurred speech.  She was initially admitted to the intensive care unit with acute renal failure, electrolyte disturbance and A-fib with RVR with hypotension.  She was started on amiodarone infusion at that time and CT head during that admission showed interval increase in the size of her previously noted meningioma.  Patient was hydrated with improvement of renal function and discharged home with Decadron taper and follow-up with neurosurgery in 7-10 days.  Family reports that she never returned to her baseline following that admission.  In the ED, CT head with no acute process with stable sphenoidal meningioma measuring 2.4 x 3.3 x 2.8 cm with hypodense lesion in the frontal lobe medially to the adjacent mass representing edema/infarct.  Neurosurgery was consulted and patient was transferred to [facility] for further evaluation and management under the hospitalist service.  Significant Hospital Events: 6/24 presented to [facility] ED with new onset seizures, CTH with enlarging meningioma with edema, loaded with Keppra 7/3 Underwent bifrontal craniotomy for meningioma 7/6 CTH with new 7 mm thick subdural hematoma at the foramen magnum and tracking adjacent to the basion and odontoid not currently causing critical stenosis of the foramen magnum per radiology read, but merits surveillance. 7/11  recurrent sz like activity. Not correlated on EEG. CT unchanged.  7/13 neurology signed off. 7/21 palliative care consulted. 7/23 CT chest and abdomen was performed negative for PE or significant pneumonia, without any hemorrhage 7/25 MRI brain without contrast shows increasing mass effect, SDH as well as subacute/acute small scattered infarct. 7/26 tube feeds resumed, neurology reconsulted.  Eliquis resumed per neurosurgery 7/27 oxygenation improving, mentation improving, neurology following.  CTA head and neck without any LVO.  Lower extremity Doppler without any DVT.  Echo shows new systolic dysfunction with global hypokinesis EF 40 to 45% and significant RV failure. 7/29 oxygenation improving to 6 L saturating 94%. 7/30 had another aspiration event. Tube feeds on hold. Now on NRB. Remains full code. No tracheostomy per husband. Per Neurosurgery, give at least two weeks if not more to see if she improves.  8/1 oxygenation improving again to 5 L.  Trickle tube feeds started.  Zofran and PPI scheduled twice daily. 8/2: No significant change, husband reports patient responding occasionally to verbal stimuli 8/4: Patient worked with therapy more yesterday, was able to sit at edge of bed and stand with moderate assistance x2, husband reporting that she has been more verbal, slowly improving 8/5: More somnolent today, but awakes to command with mumbling speech.  Did not work with therapy yesterday. 8/7: Husband reports increased lipsmacking today, although patient alert and appropriate to verbal command.  Remains with slow improvement.  No other significant changes 8/8: Husband reports increased verbal communication, remains on tube feeds, hopeful to work further with PT/OT today.  Continues with slow/gradual improvement.    Assessment & Plan:   Sphenoidal meningioma with vasogenic edema Seizure Subdural hemorrhage along [place] Subarachnoid hemorrhage and left occipital horn without  hydrocephalus  Narrowing foramen magnum due to SDH Patient presenting as a transfer from [facility] following tonic-clonic seizure at home.  Patient with known history of meningioma with interval increase.  Neurosurgery was consulted and patient underwent bifrontal craniotomy for meningioma resection on 03/24/2022.  MRI brain 7/25 with evidence of SDH and SAH.  Neurology was initially consulted and patient underwent multiple EEGs and LTM while inpatient, last EEG 7/28 negative for any acute seizures. --Palliative care following, appreciate assistance --Apixaban resumed 7/28 --Vimpat 200 mg per tube twice daily --Keppra 1000 mg per tube twice daily --Per neurosurgery, will at least 2 weeks from 7/30 to assess improvement --Further management per neurosurgery  Sepsis in the setting of HCAP and recurrent aspiration pneumonia Acute hypoxic respiratory failure Developed fever of 100.9 on 7/23, COVID-19 PCR negative.  Chest x-ray with new hazy opacities projecting over both upper lobes in the suprahilar regions consistent with developing infection.  CT chest, abdomen/pelvis with no PE, multifocal groundglass opacities likely pulmonary edema.  Completed course of Unasyn. --MRSA PCR negative. 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  Repeat TTE 04/17/2022 with EF 40-45% with global hypokinesis, RV function also moderately low. --net negative 12.1L since admission --wt 85>>>75.2kg --Strict I's and O's Daily weights --Titrate oxygen to maintain SPO2 >92%; on 4L [place] w/ SpO2 98% at  rest --Lasix 40 mg per tube daily --Intermittent monitoring of renal function  Acute embolic CVA, incidental Patient with known history of atrial fibrillation, MRI brain with evidence of restricted effusion left cerebellum, bilateral occipital lobe, left parietal lobe, bilateral frontal lobe; concerning for small scattered acute and subacute infarct most likely embolic in the setting of history of A-fib.  Repeat TTE showed mildly degraded LVEF of 40-45%.  Eliquis was initially held due to/SAH and need for meningioma resection as above but now has been resumed.  LDL 106 April 2023.  Neurology was initially consulted but now signed off. --SLP, remain n.p.o. with cortrak in place and tube feeds initiated --Continue PT/OT efforts while inpatient, currently recommending SNF --Crestor 20 mg per tube daily  Postoperative acute blood loss anemia, expected Iron deficiency anemia Acute on chronic thrombocytopenia Patient was transfused 2 units of PRBCs on 7/22 for hemoglobin drop of 6.8.  Anemia panel with iron 15, sats 8, ferritin 440, vitamin B12 356. --Hgb  9.3>>>6.8>>7.7>8.3>8.7>9.4 --Plt 155>>>85>>81>>>257>254>251 --Continue intermittent monitoring of CBC  Dysphagia with recurrent aspiration Remains n.p.o. per recommendations of speech therapy.  Core track placed on 03/28/2022. --Continue tube feeds --If no further improvement, will need to consider PEG/G-tube placement if goals of care remain aggressive  Hypokalemia Repleted, continue intermittent monitoring of electrolytes  Hyponatremia: Resolved Continue intermittent monitoring of electrolytes  Anxiety/depression --Sertraline 25 mg per tube daily  Type 2 diabetes mellitus Hemogram A1C 5.9, well controlled. --SSI for coverage  Paroxysmal atrial fibrillation --Amiodarone 20 mg per tube daily --Apixaban 5 mg per tube twice daily  History of essential hypertension Now with hypotension --Midodrine 5 mg per tube BID  GERD: Protonix  40 mg per tube daily  Pressure ulcer left buttock stage II, not POA Pressure Injury 04/07/22 Buttocks Left Stage 2 -  Partial thickness loss  of dermis presenting as a shallow open injury with a red, pink wound bed without slough. (Active)  04/07/22 0100  Location: Buttocks  Location Orientation: Left  Staging: Stage 2 -  Partial thickness loss of dermis presenting as a shallow open injury with a red, pink wound bed without slough.  Wound Description (Comments):   Present on Admission: No  --Continue wound care, frequent turning/offloading  Obesity Body mass index is 35.88 kg/m.  Discussed with patient needs for aggressive lifestyle changes/weight loss as this complicates all facets of care.  Outpatient follow-up with PCP.    DVT prophylaxis: SCDs Start: 03/26/22 0840 Place TED hose Start: 03/24/22 0616 Place and maintain sequential compression device Start: 03/23/22 0631 apixaban (ELIQUIS) tablet 5 mg    Code Status: Full Code Family Communication:   Disposition Plan:  Level of care: Progressive Status is: Inpatient Remains inpatient appropriate because: Per primary, neurosurgery, PT/OT currently recommending SNF placement     Procedures:  EEG 7/2, 7/7, 7/8, 7/11 Bifrontal craniotomy for resection of meningioma, Dr. Christella Noa 7/3  Antimicrobials:  Vancomycin 7/19 - 7/20 Cefepime 7/19 - 7/21 Cefazolin 7/3 - 7/3 Unasyn 7/7 - 7/10, 7/21 - 8/4   Subjective: Patient seen examined bedside, lying in bed.  Husband and other family members present. Continues with very slow progress/improvement.  Patient denies chest pain, no shortness of breath, no abdominal pain.  No acute concerns overnight per nursing staff.   Objective: Vitals:   04/28/22 2324 04/29/22 0400 04/29/22 0722 04/29/22 1109  BP: 115/61 135/63 (!) 143/64 125/61  Pulse: 89 90 90 91  Resp: '20 18 17 17  '$ Temp: 98.4 F (36.9 C) 98.6 F (37 C) 98.6 F (37 C) 98.7 F (37.1 C)  TempSrc: Oral Axillary Oral Oral   SpO2: 95% 98% 90% (!) 87%  Weight:      Height:        Intake/Output Summary (Last 24 hours) at 04/29/2022 1135 Last data filed at 04/29/2022 0830 Gross per 24 hour  Intake 180 ml  Output 1200 ml  Net -1020 ml   Filed Weights   04/26/22 0500 04/27/22 0500 04/28/22 0333  Weight: 77.7 kg 73.3 kg 75.2 kg    Examination:  Physical Exam: GEN: Chronically ill in appearance, alert x1, NAD HEENT: Frontal craniotomy incision site noted, dry mucous membranes, PERRL, cortak tube noted in place PULM: Decreased breath sounds bilateral bases, no crackles/wheezing, normal respiratory effort on 4 L nasal cannula with SPO2 98% at rest CV: RRR w/o M/G/R GI: abd soft, NTND, NABS, no R/G/M MSK: No peripheral edema    Data Reviewed: I have personally reviewed following labs and imaging studies  CBC: Recent Labs  Lab 04/23/22 0607 04/24/22 0159 04/27/22 0327  WBC 9.2 10.6* 13.7*  NEUTROABS 6.7 7.6  --   HGB 9.3* 8.7* 9.4*  HCT 30.7* 28.5* 30.0*  MCV 90.0 90.2 87.2  PLT 254 251 419   Basic Metabolic Panel: Recent Labs  Lab 04/23/22 0607 04/24/22 0159 04/25/22 0242 04/27/22 0327  NA 148* 146* 143 132*  K 3.5 3.4* 4.3 3.8  CL 112* 111 109 97*  CO2 '28 27 26 25  '$ GLUCOSE 151* 156* 155* 143*  BUN '23 22 20 23  '$ CREATININE 0.89 0.87 0.83 0.77  CALCIUM 8.6* 8.4* 8.5* 8.4*  MG 2.1  --  1.9 1.9   GFR: Estimated Creatinine Clearance: 50.2 mL/min (by C-G formula based on SCr of 0.77 mg/dL). Liver Function Tests: No results for input(s): "AST", "ALT", "ALKPHOS", "BILITOT", "PROT", "  ALBUMIN" in the last 168 hours.  No results for input(s): "LIPASE", "AMYLASE" in the last 168 hours. No results for input(s): "AMMONIA" in the last 168 hours. Coagulation Profile: No results for input(s): "INR", "PROTIME" in the last 168 hours. Cardiac Enzymes: No results for input(s): "CKTOTAL", "CKMB", "CKMBINDEX", "TROPONINI" in the last 168 hours. BNP (last 3 results) No results for input(s): "PROBNP"  in the last 8760 hours. HbA1C: No results for input(s): "HGBA1C" in the last 72 hours. CBG: Recent Labs  Lab 04/28/22 0525 04/28/22 1214 04/28/22 1451 04/28/22 2357 04/29/22 0512  GLUCAP 141* 166* 118* 153* 140*   Lipid Profile: No results for input(s): "CHOL", "HDL", "LDLCALC", "TRIG", "CHOLHDL", "LDLDIRECT" in the last 72 hours. Thyroid Function Tests: No results for input(s): "TSH", "T4TOTAL", "FREET4", "T3FREE", "THYROIDAB" in the last 72 hours. Anemia Panel: No results for input(s): "VITAMINB12", "FOLATE", "FERRITIN", "TIBC", "IRON", "RETICCTPCT" in the last 72 hours. Sepsis Labs: No results for input(s): "PROCALCITON", "LATICACIDVEN" in the last 168 hours.  No results found for this or any previous visit (from the past 240 hour(s)).        Radiology Studies: No results found.      Scheduled Meds:  amiodarone  200 mg Per Tube Daily   apixaban  5 mg Per Tube BID   budesonide (PULMICORT) nebulizer solution  0.25 mg Nebulization BID   Chlorhexidine Gluconate Cloth  6 each Topical Daily   feeding supplement (PROSource TF)  45 mL Per Tube TID   free water  300 mL Per Tube Q4H   furosemide  40 mg Per Tube Daily   insulin aspart  0-15 Units Subcutaneous Q6H   lacosamide  200 mg Per Tube BID   levETIRAcetam  1,000 mg Per Tube BID   midodrine  5 mg Per Tube BID WC   montelukast  10 mg Per Tube QHS   multivitamin with minerals  1 tablet Per Tube Daily   ondansetron  4 mg Per Tube Q12H   mouth rinse  15 mL Mouth Rinse 4 times per day   pantoprazole sodium  40 mg Per Tube BID   polyethylene glycol  17 g Per NG tube Daily   rosuvastatin  20 mg Per Tube Daily   senna  1 tablet Per Tube BID   sertraline  25 mg Per Tube Daily   Continuous Infusions:  feeding supplement (OSMOLITE 1.5 CAL) 1,000 mL (04/26/22 1850)     LOS: 45 days    Time spent: 49 minutes spent on chart review, discussion with nursing staff, consultants, updating family and interview/physical  exam; more than 50% of that time was spent in counseling and/or coordination of care.    Suda Forbess J [place] DO [facility] Available via Epic secure chat 7am-7pm After these hours, please refer to coverage provider listed on [email or web address] 04/29/2022, 11:35 AM

## 2022-04-29 NOTE — Progress Notes (Signed)
Patient ID: Tara [name], female   DOB: 11-04-1945, 76 y.o.   MRN: [id number] BP 127/71 (BP Location: Left Arm)   Pulse 91   Temp 98.6 F (37 C) (Oral)   Resp 20   Ht '4\' 9"'$  (1.448 m)   Wt 75.2 kg   SpO2 93%   BMI 35.88 kg/m  Alert, conversant. Moving all extremities Wound is clean, and dry Continuing to improve.

## 2022-04-29 NOTE — Progress Notes (Signed)
Occupational Therapy Treatment Patient Details Name: Tara [name] MRN: [id number] DOB: Dec 10, 1945 Today's Date: 04/29/2022   History of present illness 76 yo woman admitted 03/15/22 with new onset seizures related to meningioma. s/p tumor resection 7/3. Recent DC from [facility] 6/22. MRI on 7/26 shows: SDH extending inferiorly along the clivus and  narrowing the foramen magnum with possible mass effect. PMH: CKD stage 3, HFpEF, a fib, meningioma.   OT comments  Session focused on R hand palm guard - upon arrival, pt still with fisted grasp and required increased time and effort to extend each digit. Palm dried and mild MASD noted on skin fold of palm. R palm guard placed, sister in law and RN educated on wear schedule and sign hung in room. Pt sitting in recliner upon arrival, adjusted to improved position with RN assist. OT POC remains appropriate.    Recommendations for follow up therapy are one component of a multi-disciplinary discharge planning process, led by the attending physician.  Recommendations may be updated based on patient status, additional functional criteria and insurance authorization.    Follow Up Recommendations  Skilled nursing-short term rehab (<3 hours/day)    Assistance Recommended at Discharge Frequent or constant Supervision/Assistance  Patient can return home with the following  Two people to help with walking and/or transfers;Two people to help with bathing/dressing/bathroom;Assistance with cooking/housework;Assistance with feeding;Direct supervision/assist for medications management;Direct supervision/assist for financial management;Assist for transportation;Help with stairs or ramp for entrance   Equipment Recommendations  Other (comment)    Recommendations for Other Services Other (comment)    Precautions / Restrictions Precautions Precautions: Fall Precaution Comments: seizures, multiple lines, cortrak Restrictions Weight Bearing Restrictions: No        Mobility Bed Mobility               General bed mobility comments: OOB in chiar upon arrival    Transfers Overall transfer level: Needs assistance                       Balance Overall balance assessment: Needs assistance Sitting-balance support: Feet supported Sitting balance-Leahy Scale: Poor                                     ADL either performed or assessed with clinical judgement   ADL Overall ADL's : Needs assistance/impaired                                       General ADL Comments: focus of session on palm guard and re-edujecting pt in recliner    Extremity/Trunk Assessment Upper Extremity Assessment Upper Extremity Assessment: RUE deficits/detail RUE Deficits / Details: continues to guard in an internally rotated and flexed patterned. +fisted grasp. Able to reach 90% of extension. Pt given palm guard with red tubing placed for fucntional grasp pattern RUE Coordination: decreased fine motor;decreased gross motor LUE Deficits / Details: Pt moving LUE purposefully this date to adjust her blanket and reach for her painful R shoulder LUE Coordination: decreased fine motor;decreased gross motor   Lower Extremity Assessment Lower Extremity Assessment: Defer to PT evaluation         Cognition Arousal/Alertness: Awake/alert Behavior During Therapy: Flat affect Overall Cognitive Status: Impaired/Different from baseline Area of Impairment: Orientation, Attention, Memory, Following commands, Safety/judgement, Problem solving, Awareness  Current Attention Level: Focused, Sustained Memory: Decreased short-term memory, Decreased recall of precautions Following Commands: Follows one step commands inconsistently, Follows one step commands with increased time Safety/Judgement: Decreased awareness of safety, Decreased awareness of deficits Awareness: Intellectual Problem Solving: Slow processing,  Difficulty sequencing, Requires verbal cues, Requires tactile cues General Comments: pt repeating words this date and stating yes/no seeminging accurately with increased time        Exercises Other Exercises Other Exercises: PROM of L digits    Shoulder Instructions       General Comments VSS on RA - sister in law present    Pertinent Vitals/ Pain       Pain Assessment Pain Assessment: Faces Faces Pain Scale: Hurts a little bit Pain Location: R shoulder Pain Descriptors / Indicators: Grimacing, Moaning Pain Intervention(s): Limited activity within patient's tolerance, Monitored during session   Frequency  Min 2X/week        Progress Toward Goals  OT Goals(current goals can now be found in the care plan section)  Progress towards OT goals: Progressing toward goals  Acute Rehab OT Goals Patient Stated Goal: unable OT Goal Formulation: Patient unable to participate in goal setting Time For Goal Achievement: 04/30/22 Potential to Achieve Goals: Fair ADL Goals Pt Will Perform Grooming: with max assist;sitting Pt Will Perform Upper Body Bathing: with max assist;sitting Pt Will Perform Lower Body Bathing: with max assist;bed level Pt Will Perform Lower Body Dressing: with modified independence;sitting/lateral leans;sit to/from stand Pt Will Transfer to Toilet: with max assist;with +2 assist;bedside commode Pt Will Perform Toileting - Clothing Manipulation and hygiene: with modified independence;sitting/lateral leans;sit to/from stand Additional ADL Goal #1: Pt will track to midline to find family members with moderate directional cues and max assist to turn head  Plan Discharge plan remains appropriate;Frequency remains appropriate       AM-PAC OT "6 Clicks" Daily Activity     Outcome Measure   Help from another person eating meals?: Total Help from another person taking care of personal grooming?: A Lot Help from another person toileting, which includes using  toliet, bedpan, or urinal?: Total Help from another person bathing (including washing, rinsing, drying)?: Total Help from another person to put on and taking off regular upper body clothing?: Total Help from another person to put on and taking off regular lower body clothing?: Total 6 Click Score: 7    End of Session    OT Visit Diagnosis: Unsteadiness on feet (R26.81);Other abnormalities of gait and mobility (R26.89);Muscle weakness (generalized) (M62.81);Low vision, both eyes (H54.2);Other symptoms and signs involving cognitive function;Pain   Activity Tolerance Patient tolerated treatment well   Patient Left in chair;with call bell/phone within reach;with chair alarm set;with family/visitor present   Nurse Communication Mobility status        Time: 1535-1550 OT Time Calculation (min): 15 min  Charges: OT General Charges $OT Visit: 1 Visit OT Treatments $Therapeutic Activity: 8-22 mins   Davaris Youtsey A Isebella [place]hur 04/29/2022, 5:01 PM

## 2022-04-29 NOTE — Progress Notes (Signed)
Calorie Count Note  48 hour calorie count ordered  Diet: Dysphagia 1 with Honey thickened liquids TF: Osmolite 1.5 @ 40 ml/hr with 45 ml ProSource TF BID  8/8 Diet advanced per SLP after MBS. Per SLP notes pt requires extra time for pt to swallow twice after bites.   Per RN pt ate 50% of her lunch today and is hopeful she will be able to eat well at dinner.   - Start Calorie count  - Change to nocturnal TF Osmolite 1.5 @ 80 ml/hr (run from 1800 - 0600) 45 ml ProSrouce TF BID  Akaila Rambo P., RD, LDN, CNSC See AMiON for contact information

## 2022-04-30 DIAGNOSIS — R569 Unspecified convulsions: Secondary | ICD-10-CM | POA: Diagnosis not present

## 2022-04-30 LAB — CBC
HCT: 29.4 % — ABNORMAL LOW (ref 36.0–46.0)
Hemoglobin: 9.6 g/dL — ABNORMAL LOW (ref 12.0–15.0)
MCH: 27.7 pg (ref 26.0–34.0)
MCHC: 32.7 g/dL (ref 30.0–36.0)
MCV: 85 fL (ref 80.0–100.0)
Platelets: 273 10*3/uL (ref 150–400)
RBC: 3.46 MIL/uL — ABNORMAL LOW (ref 3.87–5.11)
RDW: 17.2 % — ABNORMAL HIGH (ref 11.5–15.5)
WBC: 13.8 10*3/uL — ABNORMAL HIGH (ref 4.0–10.5)
nRBC: 0 % (ref 0.0–0.2)

## 2022-04-30 LAB — GLUCOSE, CAPILLARY
Glucose-Capillary: 109 mg/dL — ABNORMAL HIGH (ref 70–99)
Glucose-Capillary: 140 mg/dL — ABNORMAL HIGH (ref 70–99)
Glucose-Capillary: 146 mg/dL — ABNORMAL HIGH (ref 70–99)
Glucose-Capillary: 153 mg/dL — ABNORMAL HIGH (ref 70–99)
Glucose-Capillary: 156 mg/dL — ABNORMAL HIGH (ref 70–99)

## 2022-04-30 LAB — BASIC METABOLIC PANEL
Anion gap: 11 (ref 5–15)
BUN: 23 mg/dL (ref 8–23)
CO2: 27 mmol/L (ref 22–32)
Calcium: 8.8 mg/dL — ABNORMAL LOW (ref 8.9–10.3)
Chloride: 98 mmol/L (ref 98–111)
Creatinine, Ser: 0.79 mg/dL (ref 0.44–1.00)
GFR, Estimated: >60 mL/min (ref >60)
Glucose, Bld: 155 mg/dL — ABNORMAL HIGH (ref 70–99)
Potassium: 4.1 mmol/L (ref 3.5–5.1)
Sodium: 136 mmol/L (ref 135–145)

## 2022-04-30 LAB — MAGNESIUM: Magnesium: 2 mg/dL (ref 1.7–2.4)

## 2022-04-30 NOTE — Progress Notes (Signed)
Speech Language Pathology Treatment: Dysphagia  Patient Details Name: Tara [name] MRN: [id number] DOB: [birth year] Today's Date: 04/30/2022 Time: 2025-4270 SLP Time Calculation (min) (ACUTE ONLY): 14 min  Assessment / Plan / Recommendation Clinical Impression  Mrs. Tara [name] seen upright in chair and even though she keeps eyes closed most of the time she appeared slightly less interactive this session ans sister-in-law said she was "trying to steal a nap". She did not follow commands during session unless given tactile cues and did answer question verbally x 1. Swallow initiation appeared delayed and slightly weaker today without signs of aspiration. Oral manipulation functional with minimal right sided labial residue and unable to achieve hand to mouth with cup given hand over hand assist due to rigidity.  Sister educated yesterday on MBS results and continued education to look for pt to swallow and allow additional time for second spontaneous swallow (not observed today). Feed slowly with small sips and pt in upright position. Continue ST.      HPI HPI: Pt is a 76 y/o who presented 6/24 with new onset seizures. 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 EEG negative for seizures. Neurosurgey consulted and did not recommend meningioma resection as of 6/27. Decline in swallow function noted overnight 6/27. Dx acute toxic metabolic encephalopathy suspected secondary to keppra. Pt s/p Bifrontal craniotomy for resection of meningioma 7/3.  Cortrak 7/7. 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  CXR 8/6 concerning for patchy airspace disease. PMH: HTN, HLD, GERD, CKD stage IIIb, chronic HFpEF, meningioma, breast CA-s/p right lumpectomy and chemoradiation in 2008, depression/anxiety. BSE 03/11/22: regular texture diet and thin liquids without need for follow up.      SLP Plan  Continue with current plan of care      Recommendations for follow up therapy are one  component of a multi-disciplinary discharge planning process, led by the attending physician.  Recommendations may be updated based on patient status, additional functional criteria and insurance authorization.    Recommendations  Diet recommendations: Dysphagia 1 (puree);Honey-thick liquid Liquids provided via: Cup;Straw Medication Administration: Crushed with puree Supervision: Full supervision/cueing for compensatory strategies;Staff to assist with self feeding Compensations: Minimize environmental distractions;Slow rate;Small sips/bites;Multiple dry swallows after each bite/sip Postural Changes and/or Swallow Maneuvers: Seated upright 90 degrees                Oral Care Recommendations: Oral care BID Follow Up Recommendations: Acute inpatient rehab (3hours/day) Assistance recommended at discharge: Frequent or constant Supervision/Assistance SLP Visit Diagnosis: Dysphagia, oropharyngeal phase (R13.12) Plan: Continue with current plan of care           Houston Siren  04/30/2022, 1:03 PM

## 2022-04-30 NOTE — Progress Notes (Signed)
Patient ID: Tara [name], female   DOB: [birth year], 76 y.o.   MRN: [id number] Progressing well Continues to speak with one and all Moving everything BP 106/72 (BP Location: Right Leg)   Pulse 84   Temp 98.5 F (36.9 C) (Oral)   Resp 15   Ht '4\' 9"'$  (1.448 m)   Wt 74 kg   SpO2 100%   BMI 35.30 kg/m

## 2022-04-30 NOTE — Progress Notes (Signed)
Progress Note Patient: Tara [name]:[id number] DOB: October 17, 1945 DOA: 03/15/2022  DOS: the patient was seen and examined on 04/30/2022  Brief hospital course: 76 year old African-American female PMH of anxiety, depression, type II DM, HLD, PAF, on Eliquis, CKD 3B, breast cancer SP chemoradiation presented to hospital on 6/24 with new onset seizures. Known history of sphenoidal meningioma that was noted to be increased in size on the CT scan with surrounding edema so she is transferred from [facility] for further evaluation.  She underwent LTM with no further seizures and was evaluated by neurosurgery and underwent bifrontal craniotomy for meningioma resection on 03/24/2020 and was transferred to the intensive care unit postoperatively with PCCM consulting while she was in ICU. Significant Hospital Events: 6/24 presented to [facility] ED with new onset seizures, CTH with enlarging meningioma with edema, loaded with Keppra 7/3 Underwent bifrontal craniotomy for meningioma 7/6 CTH with new 7 mm thick subdural hematoma at the foramen magnum and tracking adjacent to the basion and odontoid not currently causing critical stenosis of the foramen magnum per radiology read, but merits surveillance. 7/11 recurrent sz like activity. Not correlated on EEG. CT unchanged.  7/13 neurology signed off. 7/21 palliative care was consulted. 7/23 CT chest and abdomen was performed negative for PE or significant pneumonia, without any hemorrhage 7/25 MRI brain without contrast shows increasing mass effect, SDH as well as subacute/acute small scattered infarct. 7/26 tube feeds resumed, neurology reconsulted.  Eliquis resumed per neurosurgery 7/27 oxygenation improving, mentation improving, neurology following.  CTA head and neck without any LVO.  Lower extremity Doppler without any DVT.  Echo shows new systolic dysfunction with global hypokinesis EF 40 to 45% and significant RV failure. 7/29 oxygenation improving to 6 L  saturating 94%. 7/30 had another aspiration event. Tube feeds on hold. Now on NRB. Remains full code. No tracheostomy per husband. Per Neurosurgery, give at least two weeks if not more to see if she improves.  8/1 oxygenation improving again to 5 L.  Trickle tube feeds started. 8/9 still on 10 L of oxygen although mentation significantly better.  Currently on D1 honey thick liquid diet. Assessment and Plan: Sphenoidal meningioma with vasogenic edema and new onset seizures Postoperative SDH, SAH Narrowing of the foramen magnum due SDH On admission Tonic-clonic seizure with no previous history of seizures Initially presented to [facility], under hospitalist service and transferred to [facility]. Neurosurgery was consulted. S/P bifrontal craniotomy for meningioma resection on 03/24/2022 Patient was started on Keppra and Vimpat.  Neurology was consulted. Underwent multiple EEG as well as LTM.  Last EEG 7/28 negative for any acute seizures. MRI 7/25 shows evidence of SDH and SAH.  Which was present on prior CT as well Apixaban resumed on 7/28 Per Neurosurgery allow at least 2 weeks from 7/30 to assess improvement.  Management per neurosurgery.   Sepsis in setting of HCAP and Recurrent Aspiration pneumonia  Acute hypoxic respiratory failure Febrile on 7/23 on 100.9 BC x2 NGTD, repeat BC X 2 NGTD  SARS COVID-negative UA unremarkable for infection, UC growing multiple species Chest x-ray showed "New hazy opacities projecting over both upper lobes in the suprahilar regions could reflect developing infection in the correct clinical setting CT chest, abdomen/pelvis, showed no PE, multifocal groundglass opacity likely pulmonary edema. CT abdomen pelvis with no evidence of hemorrhage PCCM was consulted and pt was switched to IV Unasyn. Continue DuoNebs, nebulizers Continue supplemental oxygen as needed Pulmonary toilet with Chest Vest and NT suction. On 7/30 worsening  hypoxia and  needed NRB.  Most likely another aspiration event. Continue PRN IV lasix.  Currently on oral Lasix. Completed 14-day antibiotic course with last day on 8/4.   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 Currently appears to have severe exacerbation of heart failure. Repeat echocardiogram on 04/17/2022 shows EF dropping to 40 to 45% with global hypokinesis.  RV function also moderately low. CT PE protocol as well as lower extremity Doppler have ruled out VTE/PE Continue with IV Lasix as needed.  Currently on oral Lasix. Continue midodrine support for hypotension Due to hypotension currently unable to initiate GDMT.   RV dysfunction. PE ruled out.  Possibly due to pneumonia. Discussed with pulmonary. Patient will require repeat echocardiogram in 6 months.   Acute embolic CVA-incidental Patient has history of A-fib. MRI brain shows evidence of restricted diffusion and left cerebellum, bilateral occipital lobe, left parietal lobe, bilateral frontal lobe, concerning for small scattered acute and subacute infarct most likely embolic in the setting of A-fib. Anticoagulation is currently resumed. Echocardiogram shows mildly worsening EF.   Patient currently remains n.p.o. per speech therapy evaluation. PT and OT following.  Recommend SNF. Currently on Crestor.  LDL 106 in April 2023.  Currently due to being on continuous tube feeding lipid panel will not be accurate. Neurology signed off again.   Postoperative acute blood loss anemia-expected. Iron deficiency anemia Acute on chronic thrombocytopenia Hemoglobin dropped to 6.5 on 7/22 Transfused 2 units of PRBC on 7/22 Iron 15, sats 8, ferritin 440, vitamin B12-->356 Continue monitor for signs and symptoms bleeding; no overt bleeding noted Platelet counts now improving. Continue iron  support.   Dysphagia Recurrent aspiration Dietitian following speech therapy following.  Currently on tube feeding.  Cortrak placed on 03/28/2022.  Per my discussion with RD, given that the tube is already postpyloric no indication for further advancement. Monitor for pressure injury due to core track in the nares.   [place] discussion Due to multiple comorbidities, minimally responsive state, overall poor prognosis Palliative care consulted for further goals of care discussion.  As of 7/30 per neurosurgery, "Would certainly give at least two weeks if not more to see if she improves." Husband does not want the patient to go to tracheostomy.   Hypokalemia Replace as needed   Hypernatremia. In the setting of diuresis. Improving with increasing free water.   Anxiety and Depression Continue with sertraline 25 mg per tube daily    HTN now hypotension   Paroxysmal Atrial Fibrillation Hyperlipidemia Continue Amiodarone 200 mg p.o. per tube daily, and Rosuvastatin 20 mg per tube daily   Type 2 DM controlled with hyperlipidemia and peripheral vascular disease Continue SSI, Accu-Cheks, hypoglycemic protocol Hemoglobin A1c 6.3 in 02/27/2022.   GERD/GI prophylaxis Continue pantoprazole 40 mg per tube daily   Obesity Body mass index is 35.3 kg/m.  Placing the patient at high risk of poor outcome.  She may also have undiagnosed sleep apnea.   Pressure ulcer left buttock stage II not present on admission. Continue wound care foam dressing and frequent turning.  Subjective: No nausea or vomiting no fever no chills.  Reportedly patient has been more conversive although not talking with me.  Oral intake is adequate per family.  Physical Exam: Vitals:   04/30/22 0408 04/30/22 0715 04/30/22 1110 04/30/22 1503  BP:  (!) 109/58 122/75 106/72  Pulse:  91 87 84  Resp:  $'17 20 15  'f$ Temp:  98.2 F (36.8 C) 98 F (36.7 C) 98.5 F (36.9 C)  TempSrc:  Oral Oral Oral  SpO2:  91% 98% 100%   Weight: 74 kg     Height:       General: Appear in mild distress; no visible Abnormal Neck Mass Or lumps, Conjunctiva normal Cardiovascular: S1 and S2 Present, no Murmur, Respiratory: good respiratory effort, Bilateral Air entry present and bilateral Crackles, no wheezes Abdomen: Bowel Sound present, Non tender  Extremities: no Pedal edema Neurology: alert and non verbal occasionally following commands Gait not checked due to patient safety concerns   Data Reviewed: I have Reviewed nursing notes, Vitals, and Lab results since pt's last encounter. Pertinent lab results CBC and BMP I have ordered test including CBC and BMP    Family Communication: Family at bedside  Disposition: Status is: Inpatient Remains inpatient appropriate because: Need to improve oral intake.  Currently undergoing calorie count.  Author: Berle Mull, MD 04/30/2022 6:22 PM  Please look on [email or web address] to find out who is on call.

## 2022-04-30 NOTE — Progress Notes (Signed)
Physical Therapy Treatment Patient Details Name: Tara [name] MRN: [id number] DOB: 09-15-1946 Today's Date: 04/30/2022   History of Present Illness 76 yo woman admitted 03/15/22 with new onset seizures related to meningioma. s/p tumor resection 7/3. Recent DC from [facility] 6/22. MRI on 7/26 shows: SDH extending inferiorly along the clivus and  narrowing the foramen magnum with possible mass effect. PMH: CKD stage 3, HFpEF, a fib, meningioma.    PT Comments    The pt was seen for re-evaluation of progress towards therapy goals. She is continuing to make slow but steady progress with alertness, activity tolerance, and command following at this time. She was able to assist with active ROM at L shoulder, elbow, and followed intermittent commands with L hand/grasp. The pt did attempt verbalizations this session, but only x3 single words through entire session with max encouragement. She continues to require totalA to complete bed mobility and is dependent on lift to transfer to chair at this time. The pt was able to make progress towards some of her mobility goals, all goals were updated to reflect current progress and mobility. Continue to recommend SNF rehab at d/c to continue to progress functional strength and mobility.    Recommendations for follow up therapy are one component of a multi-disciplinary discharge planning process, led by the attending physician.  Recommendations may be updated based on patient status, additional functional criteria and insurance authorization.  Follow Up Recommendations  Skilled nursing-short term rehab (<3 hours/day) Can patient physically be transported by private vehicle: No   Assistance Recommended at Discharge Frequent or constant Supervision/Assistance  Patient can return home with the following Assistance with cooking/housework;Assist for transportation;Direct supervision/assist for financial management;Two people to help with walking and/or transfers;Two people  to help with bathing/dressing/bathroom;Help with stairs or ramp for entrance   Equipment Recommendations  Wheelchair (measurements PT);Wheelchair cushion (measurements PT);Hospital bed;Other (comment)    Recommendations for Other Services       Precautions / Restrictions Precautions Precautions: Fall Precaution Comments: seizures, multiple lines, cortrak Restrictions Weight Bearing Restrictions: No     Mobility  Bed Mobility Overal bed mobility: Needs Assistance Bed Mobility: Rolling, Sidelying to Sit Rolling: Total assist         General bed mobility comments: pt with no attempt to assist with rolling    Transfers Overall transfer level: Needs assistance Equipment used: Ambulation equipment used Transfers: Bed to chair/wheelchair/BSC             General transfer comment: totalA with use of lift due to pt fatigue and lack skilled assist Transfer via Lift Equipment: Maximove  Ambulation/Gait               General Gait Details: unable to manage, total A to maintain static stance    Modified Rankin (Stroke Patients Only) Modified Rankin (Stroke Patients Only) Pre-Morbid Rankin Score: Moderately severe disability Modified Rankin: Severe disability     Balance Overall balance assessment: Needs assistance Sitting-balance support: Feet supported Sitting balance-Leahy Scale: Poor Sitting balance - Comments: able to lean forwards in chair x5, minA to maintain                                    Cognition Arousal/Alertness: Awake/alert Behavior During Therapy: Flat affect Overall Cognitive Status: Impaired/Different from baseline  General Comments: pt with few attempts to verbalize, stating single-word answers with max encouragement. was able to demo more consistent command following with LLE and LUE at this time compared to prior sessions        Exercises General Exercises - Upper  Extremity Shoulder Flexion: AAROM, Left, PROM, Right, 10 reps, Seated Shoulder ABduction: AAROM, Left, PROM, Right, 10 reps, Seated Elbow Flexion: AAROM, Left, 5 reps, Seated Elbow Extension: PROM, Right, 10 reps, Seated General Exercises - Lower Extremity Ankle Circles/Pumps: AAROM, Both, 10 reps, Seated Long Arc Quad: PROM, Both, 5 reps, Seated Hip ABduction/ADduction: PROM, Both, 10 reps, Supine Other Exercises Other Exercises: RLE calf stretch Other Exercises: passive cervical ROM x5 each direction    General Comments General comments (skin integrity, edema, etc.): VSS on 10L HFNC      Pertinent Vitals/Pain Pain Assessment Pain Assessment: Faces Faces Pain Scale: No hurt Breathing: normal Negative Vocalization: none Facial Expression: smiling or inexpressive Body Language: relaxed Consolability: no need to console PAINAD Score: 0 Pain Intervention(s): Monitored during session     PT Goals (current goals can now be found in the care plan section) Acute Rehab PT Goals Patient Stated Goal: per family, to return to independence, plan to wait 2 weeks (8/13) to see improvement PT Goal Formulation: With family Time For Goal Achievement: 05/14/22 Potential to Achieve Goals: Fair Progress towards PT goals: Progressing toward goals    Frequency    Min 3X/week      PT Plan Current plan remains appropriate    Co-evaluation PT/OT/SLP Co-Evaluation/Treatment: Yes            AM-PAC PT "6 Clicks" Mobility   Outcome Measure  Help needed turning from your back to your side while in a flat bed without using bedrails?: Total Help needed moving from lying on your back to sitting on the side of a flat bed without using bedrails?: Total Help needed moving to and from a bed to a chair (including a wheelchair)?: Total Help needed standing up from a chair using your arms (e.g., wheelchair or bedside chair)?: Total Help needed to walk in hospital room?: Total Help needed  climbing 3-5 steps with a railing? : Total 6 Click Score: 6    End of Session Equipment Utilized During Treatment: Gait belt;Oxygen Activity Tolerance: Patient tolerated treatment well Patient left: in chair;with call bell/phone within reach;with chair alarm set;with family/visitor present Nurse Communication: Mobility status;Need for lift equipment PT Visit Diagnosis: Other abnormalities of gait and mobility (R26.89);Other symptoms and signs involving the nervous system (R29.898)     Time: 0630-1601 PT Time Calculation (min) (ACUTE ONLY): 38 min  Charges:  $Therapeutic Exercise: 8-22 mins $Therapeutic Activity: 8-22 mins                     [place] Carbo, PT, DPT   Acute Rehabilitation Department   Sandra Cockayne 04/30/2022, 5:40 PM

## 2022-05-01 DIAGNOSIS — R569 Unspecified convulsions: Secondary | ICD-10-CM | POA: Diagnosis not present

## 2022-05-01 LAB — GLUCOSE, CAPILLARY
Glucose-Capillary: 148 mg/dL — ABNORMAL HIGH (ref 70–99)
Glucose-Capillary: 155 mg/dL — ABNORMAL HIGH (ref 70–99)
Glucose-Capillary: 173 mg/dL — ABNORMAL HIGH (ref 70–99)
Glucose-Capillary: 200 mg/dL — ABNORMAL HIGH (ref 70–99)

## 2022-05-01 NOTE — Progress Notes (Signed)
Patient ID: Tara [name], female   DOB: 1945-12-07, 76 y.o.   MRN: [id number] BP 120/68 (BP Location: Left Leg)   Pulse 96   Temp 99.4 F (37.4 C) (Oral)   Resp 19   Ht '4\' 9"'$  (1.448 m)   Wt 72.6 kg   SpO2 99%   BMI 34.64 kg/m  Alert, conversant Perrl, full eom Wound healing well Moving all extremities PT believes that snf would be appropriate. Family I know is not willing to place Mrs. Philbin in a snf.  She has improved a great deal over the last week. Will continue to define a placement situation amenable to family wishes.

## 2022-05-01 NOTE — TOC Progression Note (Signed)
Transition of Care [facility]) - Progression Note    Patient Details  Name: Tara [name] MRN: [id number] Date of Birth: 08-07-1946  Transition of Care [facility]) CM/SW [place], [place] Phone Number: 05/01/2022, 10:35 AM  Clinical Narrative:     TOC continues to follow  Expected Discharge Plan: [place] Barriers to Discharge: Continued Medical Work up, Ship broker  Expected Discharge Plan and Services Expected Discharge Plan: [place]   Discharge Planning Services: CM Consult   Living arrangements for the past 2 months: [place]: [place]         Social Determinants of Health (SDOH) Interventions    Readmission Risk Interventions    03/06/2022   12:36 PM  Readmission Risk Prevention Plan  Transportation Screening Complete  HRI or Home Care Consult Complete  Social Work Consult for [place] Planning/Counseling Complete

## 2022-05-01 NOTE — Progress Notes (Addendum)
Progress Note Patient: Tara [name] JME:[id number] DOB: 11-18-1945 DOA: 03/15/2022  DOS: the patient was seen and examined on 05/01/2022  Brief hospital course: 76 year old African-American female PMH of anxiety, depression, type II DM, HLD, PAF, on Eliquis, CKD 3B, breast cancer SP chemoradiation presented to hospital on 6/24 with new onset seizures. Known history of sphenoidal meningioma that was noted to be increased in size on the CT scan with surrounding edema so she is transferred from [facility] for further evaluation.  She underwent LTM with no further seizures and was evaluated by neurosurgery and underwent bifrontal craniotomy for meningioma resection on 03/24/2020 and was transferred to the intensive care unit postoperatively with PCCM consulting while she was in ICU. Significant Hospital Events: 6/24 presented to [facility] ED with new onset seizures, CTH with enlarging meningioma with edema, loaded with Keppra 7/3 Underwent bifrontal craniotomy for meningioma 7/6 CTH with new 7 mm thick subdural hematoma at the foramen magnum and tracking adjacent to the basion and odontoid not currently causing critical stenosis of the foramen magnum per radiology read, but merits surveillance. 7/11 recurrent sz like activity. Not correlated on EEG. CT unchanged.  7/13 neurology signed off. 7/21 palliative care was consulted. 7/23 CT chest and abdomen was performed negative for PE or significant pneumonia, without any hemorrhage 7/25 MRI brain without contrast shows increasing mass effect, SDH as well as subacute/acute small scattered infarct. 7/26 tube feeds resumed, neurology reconsulted.  Eliquis resumed per neurosurgery 7/27 oxygenation improving, mentation improving, neurology following.  CTA head and neck without any LVO.  Lower extremity Doppler without any DVT.  Echo shows new systolic dysfunction with global hypokinesis EF 40 to 45% and significant RV failure. 7/29 oxygenation improving to 6 L  saturating 94%. 7/30 had another aspiration event. Tube feeds on hold. Now on NRB. Remains full code. No tracheostomy per husband. Per Neurosurgery, give at least two weeks if not more to see if she improves.  8/1 oxygenation improving again to 5 L.  Trickle tube feeds started. 8/9 still on 10 L of oxygen although mentation significantly better.  Currently on D1 honey thick liquid diet.  Calorie count initiated. 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 Neurosurgery was consulted. S/P bifrontal craniotomy for meningioma resection on 03/24/2022 Patient was started on Keppra and Vimpat.  Neurology was consulted. Underwent multiple EEG as well as LTM.  Last EEG 7/28 negative for any acute seizures. MRI 7/25 shows evidence of SDH and SAH.  Which was present on prior CT as well Apixaban resumed on 7/28 Per Neurosurgery allow at least 2 weeks from 7/30 to assess improvement.  Management per neurosurgery. Patient does have left upper and outer gaze preference with rhythmic eye movements.  If persistent, will consider EEG.   Sepsis in setting of HCAP and Recurrent Aspiration pneumonia  Acute hypoxic respiratory failure Febrile on 7/23 on 100.9 BC x2 NGTD, repeat BC X 2 NGTD  SARS COVID-negative UA unremarkable for infection, UC growing multiple species Chest x-ray showed "New hazy opacities projecting over both upper lobes in the suprahilar regions could reflect developing infection in the correct clinical setting CT chest, abdomen/pelvis, showed no PE, multifocal groundglass opacity likely pulmonary edema. CT abdomen pelvis with no evidence of hemorrhage PCCM was consulted and pt was  switched to IV  Unasyn. Continue DuoNebs, nebulizers Continue supplemental oxygen as needed Pulmonary toilet with Chest Vest and NT suction. On 7/30 worsening hypoxia and needed NRB.  Most likely another aspiration event. Continue PRN IV lasix.  Currently on oral Lasix. Completed 14-day antibiotic course with last day on 8/4. Oxygenation improving back to 5 L on 8/10.   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 Currently appears to have severe exacerbation of heart failure. Repeat echocardiogram on 04/17/2022 shows EF dropping to 40 to 45% with global hypokinesis.  RV function also moderately low. CT PE protocol as well as lower extremity Doppler have ruled out VTE/PE Continue with IV Lasix as needed.  Currently on oral Lasix. Continue midodrine support for hypotension Due to hypotension currently unable to initiate GDMT.   RV dysfunction. PE ruled out.  Possibly due to pneumonia. Discussed with pulmonary. Patient will require repeat echocardiogram in 6 months.   Acute embolic CVA-incidental Patient has history of A-fib. MRI brain shows evidence of restricted diffusion and left cerebellum, bilateral occipital lobe, left parietal lobe, bilateral frontal lobe, concerning for small scattered acute and subacute infarct most likely embolic in the setting of A-fib. Anticoagulation is currently resumed. Echocardiogram shows mildly worsening EF.   Patient currently remains n.p.o. per speech therapy evaluation. PT and OT following.  Recommend SNF. Currently on Crestor.  LDL 106 in April 2023.  Currently due to being on continuous tube feeding lipid panel will not be accurate. Neurology signed off again.   Postoperative acute blood loss anemia-expected. Iron deficiency anemia Acute on chronic thrombocytopenia Hemoglobin dropped to 6.5 on  7/22 Transfused 2 units of PRBC on 7/22 Iron 15, sats 8, ferritin 440, vitamin B12-->356 Continue monitor for signs and symptoms bleeding; no overt bleeding noted Platelet counts now improving. Continue iron support.   Dysphagia Recurrent aspiration Dietitian following speech therapy following.  Currently on tube feeding.  Cortrak placed on 03/28/2022.  Per my discussion with RD, given that the tube is already postpyloric no indication for further advancement. Monitor for pressure injury due to core track in the nares.   [place] discussion Due to multiple comorbidities, minimally responsive state, overall poor prognosis Palliative care consulted for further goals of care discussion.  As of 7/30 per neurosurgery, "Would certainly give at least two weeks if not more to see if she improves." Husband does not want the patient to go to tracheostomy.   Hypokalemia Replace as needed   Hypernatremia. In the setting of diuresis. Improving with increasing free water.   Anxiety and Depression Continue with sertraline 25 mg per tube daily    HTN now hypotension   Paroxysmal Atrial Fibrillation Hyperlipidemia Continue Amiodarone 200 mg p.o. per tube daily, and Rosuvastatin 20 mg per tube daily   Type 2 DM controlled with hyperlipidemia and peripheral vascular disease Continue SSI, Accu-Cheks, hypoglycemic protocol Hemoglobin A1c 6.3 in 02/27/2022.   GERD/GI prophylaxis Continue pantoprazole 40 mg per tube daily   Obesity Body mass index is 34.64 kg/m.  Placing the patient at high risk of poor outcome.  She may also have undiagnosed sleep apnea.   Pressure ulcer left buttock stage II not present on admission. Continue wound care foam dressing and frequent turning.  Subjective: No acute events.  Able to communicate with me today.  Left upper gaze preference.  Occasional rhythmic eye movements.  Bilateral rhythmic ankle movement. Monitor  Physical Exam: Vitals:   05/01/22 0806  05/01/22 0844 05/01/22 1156 05/01/22 1452  BP: 111/64  137/80 129/76  Pulse: 94  95 96  Resp: 19  (!) 24 (!) 21  Temp: 98.7 F (37.1 C)  98.5 F (36.9 C) 98.4 F (36.9 C)  TempSrc: Oral  Oral Oral  SpO2: 99% 99% 98% 97%  Weight:      Height:       General: Appear in mild distress; no visible Abnormal Neck Mass Or lumps, Conjunctiva normal Cardiovascular: S1 and S2 Present, no Murmur, Respiratory: good respiratory effort, Bilateral Air entry present and CTA, no Crackles, no wheezes Abdomen: Bowel Sound present, Non tender  Extremities: no Pedal edema Neurology: alert and not oriented to time, place, and person Gait not checked due to patient safety concerns  Data Reviewed: I have ordered test including CBC and BMP     Family Communication: Sister at bedside  Disposition: Status is: Inpatient Remains inpatient appropriate because: Monitor for results of the calorie count.  Author: Berle Mull, MD 05/01/2022 7:58 PM  Please look on [email or web address] to find out who is on call.

## 2022-05-01 NOTE — Progress Notes (Signed)
Inpatient Rehabilitation Admissions Coordinator   Patient not at a level to pursue CIR level rehab.  I concur with therapy recommendations for SNF. She is total assist with maxi move and total assist for bed mobility. TOC aware. We will not pursue CIR admit.  Danne Baxter, RN, MSN Rehab Admissions Coordinator [phone] 05/01/2022 1:31 PM

## 2022-05-01 NOTE — Progress Notes (Signed)
Calorie Count Note  48 hour calorie count ordered  Diet: Dysphagia 1 with Honey thickened liquids Nocturnal TF: Osmolite 1.5 @ 80 ml/hr (run from 1800 - 0600)   8/8 Diet advanced per SLP after MBS. Per SLP notes pt requires extra time for pt to swallow twice after bites.   Per RN pt eating well, spoke with sister and niece.   - Start Calorie count, spoke with RN     Lockie Pares., RD, LDN, CNSC See AMiON for contact information

## 2022-05-02 DIAGNOSIS — R569 Unspecified convulsions: Secondary | ICD-10-CM | POA: Diagnosis not present

## 2022-05-02 LAB — GLUCOSE, CAPILLARY
Glucose-Capillary: 118 mg/dL — ABNORMAL HIGH (ref 70–99)
Glucose-Capillary: 138 mg/dL — ABNORMAL HIGH (ref 70–99)
Glucose-Capillary: 160 mg/dL — ABNORMAL HIGH (ref 70–99)
Glucose-Capillary: 196 mg/dL — ABNORMAL HIGH (ref 70–99)

## 2022-05-02 MED ORDER — OSMOLITE 1.5 CAL PO LIQD
500.0000 mL | ORAL | Status: DC
  Administered 2022-05-02 – 2022-05-04 (×3): 500 mL
  Filled 2022-05-02 (×4): qty 711

## 2022-05-02 NOTE — Progress Notes (Signed)
Speech Language Pathology Treatment: Dysphagia  Patient Details Name: Tara [name] MRN: [id number] DOB: February 26, 1946 Today's Date: 05/02/2022 Time: 4128-7867 SLP Time Calculation (min) (ACUTE ONLY): 13 min  Assessment / Plan / Recommendation Clinical Impression  Pt seen for dysphagia with pt's husband present for first time with this SLP present. Reviewed results of MBS and recommendations for thick liquids and compensatory strategies. Pt initially not responsive to therapist's stimulation, tsp to lips as she normally is. She opened eyes to husband's voice and opened her mouth to accept teaspoons of honey thick (teaspoon given for easier acceptance today). Educated husband with verbal and visual cues to determine when swallow has occurred, give her additional time and observe her spontaneously swallow a second time which she typically will do. No indications of decreased airway protection with honey thick. Noted RD's note that feeds are nocturnal, free water D/C'd and calorie count up in attempts to increase oral intake.  Requested eval for speech-language-cognition (had orders before surgery that got discontinued).    HPI HPI: Pt is a 76 y/o who presented 6/24 with new onset seizures. 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 EEG negative for seizures. Neurosurgey consulted and did not recommend meningioma resection as of 6/27. Decline in swallow function noted overnight 6/27. Dx acute toxic metabolic encephalopathy suspected secondary to keppra. Pt s/p Bifrontal craniotomy for resection of meningioma 7/3.  Cortrak 7/7. 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  CXR 8/6 concerning for patchy airspace disease. PMH: HTN, HLD, GERD, CKD stage IIIb, chronic HFpEF, meningioma, breast CA-s/p right lumpectomy and chemoradiation in 2008, depression/anxiety. BSE 03/11/22: regular texture diet and thin liquids without need for follow up.      SLP Plan  Continue with  current plan of care      Recommendations for follow up therapy are one component of a multi-disciplinary discharge planning process, led by the attending physician.  Recommendations may be updated based on patient status, additional functional criteria and insurance authorization.    Recommendations  Diet recommendations: Dysphagia 1 (puree);Honey-thick liquid Liquids provided via: Cup;Straw Medication Administration: Crushed with puree Supervision: Full supervision/cueing for compensatory strategies;Staff to assist with self feeding Compensations: Slow rate;Small sips/bites;Multiple dry swallows after each bite/sip;Clear throat intermittently Postural Changes and/or Swallow Maneuvers: Seated upright 90 degrees                Oral Care Recommendations: Oral care BID Follow Up Recommendations:  (TBD) Assistance recommended at discharge: Frequent or constant Supervision/Assistance SLP Visit Diagnosis: Dysphagia, oropharyngeal phase (R13.12) Plan: Continue with current plan of care           Houston Siren  05/02/2022, 11:44 AM

## 2022-05-02 NOTE — Progress Notes (Signed)
Physical Therapy Treatment Patient Details Name: Tara [name] MRN: [id number] DOB: [birth year] Today's Date: 05/02/2022   History of Present Illness 76 yo woman admitted 03/15/22 with new onset seizures related to meningioma. s/p tumor resection 7/3. Recent DC from [facility] 6/22. MRI on 7/26 shows: SDH extending inferiorly along the clivus and  narrowing the foramen magnum with possible mass effect. PMH: CKD stage 3, HFpEF, a fib, meningioma.    PT Comments    Patient with limited progress and focus on slight arousal with eyes remaining closed during session and on sitting balance and ROM/tone inhibition.  Patient already in chair and working on sitting up at edge of chair.  She was tight in shoulders and neck and after working on ROM improved with less stiffness.  She would hold her head up on command, but did not follow other commands.  PT will continue to follow.    Recommendations for follow up therapy are one component of a multi-disciplinary discharge planning process, led by the attending physician.  Recommendations may be updated based on patient status, additional functional criteria and insurance authorization.  Follow Up Recommendations  Skilled nursing-short term rehab (<3 hours/day)     Assistance Recommended at Discharge Frequent or constant Supervision/Assistance  Patient can return home with the following Two people to help with walking and/or transfers;Two people to help with bathing/dressing/bathroom;Assist for transportation;Assistance with feeding;Assistance with Education officer, environmental (measurements PT);Wheelchair cushion (measurements PT);Hospital bed;Other (comment)    Recommendations for Other Services       Precautions / Restrictions Precautions Precautions: Fall Precaution Comments: seizures, multiple lines, cortrak Restrictions Weight Bearing Restrictions: No     Mobility  Bed Mobility               General bed  mobility comments: up in recliner    Transfers                   General transfer comment: up in chair via lift per nursing    Ambulation/Gait                   Stairs             Wheelchair Mobility    Modified Rankin (Stroke Patients Only)       Balance Overall balance assessment: Needs assistance Sitting-balance support: Feet unsupported, Bilateral upper extremity supported Sitting balance-Leahy Scale: Poor Sitting balance - Comments: lifting forward from back of chair with min to mod support with L lateral lean Postural control: Left lateral lean, Posterior lean                                  Cognition Arousal/Alertness: Lethargic Behavior During Therapy: Flat affect Overall Cognitive Status: Difficult to assess                                 General Comments: spouse present and reports had seizure meds overnight and more lethargic since        Exercises General Exercises - Lower Extremity Ankle Circles/Pumps: Both, 10 reps, Seated, PROM Long Arc Quad: PROM, Both, Seated, 10 reps Hip Flexion/Marching: PROM, Both, 5 reps, Seated    General Comments General comments (skin integrity, edema, etc.): VSS on 3L O2      Pertinent Vitals/Pain Pain Assessment Faces Pain Scale: No hurt  Home Living                          Prior Function            PT Goals (current goals can now be found in the care plan section) Progress towards PT goals: Not progressing toward goals - comment (limited due to lethargy)    Frequency    Min 3X/week      PT Plan Current plan remains appropriate    Co-evaluation PT/OT/SLP Co-Evaluation/Treatment: Yes Reason for Co-Treatment: Complexity of the patient's impairments (multi-system involvement);For patient/therapist safety PT goals addressed during session: Balance;Strengthening/ROM OT goals addressed during session: ADL's and self-care;Strengthening/ROM       AM-PAC PT "6 Clicks" Mobility   Outcome Measure  Help needed turning from your back to your side while in a flat bed without using bedrails?: Total Help needed moving from lying on your back to sitting on the side of a flat bed without using bedrails?: Total Help needed moving to and from a bed to a chair (including a wheelchair)?: Total Help needed standing up from a chair using your arms (e.g., wheelchair or bedside chair)?: Total Help needed to walk in hospital room?: Total Help needed climbing 3-5 steps with a railing? : Total 6 Click Score: 6    End of Session Equipment Utilized During Treatment: Oxygen Activity Tolerance: Patient limited by lethargy Patient left: in chair;with family/visitor present;with chair alarm set   PT Visit Diagnosis: Other abnormalities of gait and mobility (R26.89);Other symptoms and signs involving the nervous system (R29.898)     Time: 6415-8309 PT Time Calculation (min) (ACUTE ONLY): 23 min  Charges:  $Therapeutic Activity: 8-22 mins                     Magda Kiel, PT Acute Rehabilitation Services Office:[phone] 05/02/2022    [name] 05/02/2022, 2:51 PM

## 2022-05-02 NOTE — Progress Notes (Signed)
Calorie Count Note  Calorie count ordered.  Diet: Dysphagia 1 with Honey Thickened Liquids Nocturnal TF: Decrease to Osmolite 1.5 @ 50 ml/hr x 10 hours (run from 1800 - 0400) Will provide 750 kcal and 31 grams protein  D/C free water as this is proving significant volume during the day and could impact her PO intake.   8/10 - Lunch: 205 kcal and 16 grams of protein 8/10 - Dinner: 200 kcal and 18 grams of protein 8/11 - Breakfast: 375 kcal and 15 grams protein   Total intake: 780 kcal (45-50% of minimum estimated needs)  49 grams protein (50% of minimum estimated needs)  Continue nocturnal supplemental TF through the weekend and follow up on Monday. Hopefully will be able to d/c TF after the weekend.   Lockie Pares., RD, LDN, CNSC See AMiON for contact information

## 2022-05-02 NOTE — Progress Notes (Signed)
"  When patient gets restless possibly she is wet. Patient also takes some time to eat possibly best interest to spread meals out if trays come back to back." -Husband

## 2022-05-02 NOTE — Progress Notes (Signed)
Patient ID: Tara [name], female   DOB: [birth year], 76 y.o.   MRN: [id number] BP 124/73 (BP Location: Right Leg)   Pulse 93   Temp 98.6 F (37 C) (Oral)   Resp (!) 21   Ht '4\' 9"'$  (1.448 m)   Wt 75.3 kg   SpO2 95%   BMI 35.92 kg/m  speaKing less today Continuing to eat Moving all extremities progressing

## 2022-05-02 NOTE — Progress Notes (Signed)
Occupational Therapy Treatment Patient Details Name: Tara [name] MRN: [id number] DOB: June 11, 1946 Today's Date: 05/02/2022   History of present illness 76 yo woman admitted 03/15/22 with new onset seizures related to meningioma. s/p tumor resection 7/3. Recent DC from [facility] 6/22. MRI on 7/26 shows: SDH extending inferiorly along the clivus and  narrowing the foramen magnum with possible mass effect. PMH: CKD stage 3, HFpEF, a fib, meningioma.   OT comments  Pt limited by lethargy this session, seen up in recliner, eyes closed throughout session and no attempts at interacting with those around her this session. Focused on AAROM/PROM neck, trunk, BUE, and BUE towards UB ADL goals. Spouse present during session. Pt able to maintain trunk clearance from recliner to sit with BUE support and min-mod A to left trunk for several minutes. D/c recommendation remains appropriate.    Recommendations for follow up therapy are one component of a multi-disciplinary discharge planning process, led by the attending physician.  Recommendations may be updated based on patient status, additional functional criteria and insurance authorization.    Follow Up Recommendations  Skilled nursing-short term rehab (<3 hours/day)    Assistance Recommended at Discharge Frequent or constant Supervision/Assistance  Patient can return home with the following  Two people to help with walking and/or transfers;Two people to help with bathing/dressing/bathroom;Assistance with cooking/housework;Assistance with feeding;Direct supervision/assist for medications management;Direct supervision/assist for financial management;Assist for transportation;Help with stairs or ramp for entrance   Equipment Recommendations  Other (comment) (defer to next venue)    Recommendations for Other Services      Precautions / Restrictions Precautions Precautions: Fall Precaution Comments: seizures, multiple lines, cortrak Restrictions Weight  Bearing Restrictions: No       Mobility Bed Mobility               General bed mobility comments: up in recliner    Transfers                   General transfer comment: did not assess, pt already up in recliner via lift     Balance Overall balance assessment: Needs assistance Sitting-balance support: Feet unsupported, Bilateral upper extremity supported Sitting balance-Leahy Scale: Poor Sitting balance - Comments: able to clear trunk from recliner and maintain seated balance with min assist to left trunk. d/t pt's height and depth of chair unable to have feet supported.                                   ADL either performed or assessed with clinical judgement   ADL Overall ADL's : Needs assistance/impaired     Grooming: Total assistance                                 General ADL Comments: Worked on normalizing tone while seated in recliner towards UB ADL goals.    Extremity/Trunk Assessment Upper Extremity Assessment Upper Extremity Assessment: RUE deficits/detail;LUE deficits/detail;Difficult to assess due to impaired cognition RUE Deficits / Details: Initially PROM then improved to be able to move AAROM against flexion tone tendency. completed sholder flexion and horizontal abduction exercises, seated with support. Placed rolled washcloth in palm at end of session to guard palm and encourage digit extension RUE Coordination: decreased fine motor;decreased gross motor LUE Deficits / Details: AAROM shoulder and distal. Lethargy limiting assessment, not following commands this session no purposeful movement  observed. LUE Coordination: decreased fine motor;decreased gross motor   Lower Extremity Assessment Lower Extremity Assessment: Defer to PT evaluation        Vision   Vision Assessment?: Vision impaired- to be further tested in functional context Eye Alignment: Impaired (comment) Additional Comments: did not open eyes this  session despite multimodal cueing, seen in recliner   Perception     Praxis      Cognition Arousal/Alertness: Lethargic Behavior During Therapy: Flat affect Overall Cognitive Status: Difficult to assess                                 General Comments: eyes closed throughout session desite multimodal cueing, lethargic sitting up in recliner, unable to follow commands or interact this session        Exercises Exercises: Other exercises General Exercises - Upper Extremity Shoulder Flexion: AAROM, Left, PROM, Right, 10 reps, Seated Shoulder Extension: AAROM, PROM, 5 reps, Left, Both, 10 reps, Seated Shoulder ABduction: AAROM, Left, PROM, Right, 10 reps, Seated Elbow Flexion: AAROM, Left, 5 reps, Seated, PROM, Right Elbow Extension: PROM, Right, 10 reps, Seated Wrist Flexion: PROM, Both, 5 reps, Seated Wrist Extension: PROM, Both, 5 reps, Seated Digit Composite Flexion: PROM, Both, 5 reps, Seated Composite Extension: PROM, Both, 5 reps, Seated Other Exercises Other Exercises: PROM trunk extension, seated Other Exercises: PROM cervical    Shoulder Instructions       General Comments      Pertinent Vitals/ Pain       Pain Assessment Pain Assessment: Faces Faces Pain Scale: No hurt Breathing: normal Negative Vocalization: none Facial Expression: smiling or inexpressive Body Language: relaxed Consolability: no need to console PAINAD Score: 0  Home Living                                          Prior Functioning/Environment              Frequency  Min 2X/week        Progress Toward Goals  OT Goals(current goals can now be found in the care plan section)  Progress towards OT goals: Progressing toward goals  Acute Rehab OT Goals Patient Stated Goal: unable OT Goal Formulation: Patient unable to participate in goal setting Time For Goal Achievement: 05/16/22 Potential to Achieve Goals: Fair ADL Goals Pt Will Perform  Grooming: with max assist;sitting Pt Will Perform Upper Body Bathing: with max assist;sitting Pt Will Perform Lower Body Bathing: with max assist;bed level Pt Will Perform Lower Body Dressing: with modified independence;sitting/lateral leans;sit to/from stand Pt Will Transfer to Toilet: with max assist;with +2 assist;bedside commode Additional ADL Goal #1: Pt will track to midline to find family members with moderate directional cues and max assist to turn head  Plan Discharge plan remains appropriate;Frequency remains appropriate    Co-evaluation    PT/OT/SLP Co-Evaluation/Treatment: Yes Reason for Co-Treatment: For patient/therapist safety   OT goals addressed during session: ADL's and self-care;Strengthening/ROM      AM-PAC OT "6 Clicks" Daily Activity     Outcome Measure   Help from another person eating meals?: Total Help from another person taking care of personal grooming?: Total Help from another person toileting, which includes using toliet, bedpan, or urinal?: Total Help from another person bathing (including washing, rinsing, drying)?: Total Help from another person to put on and  taking off regular upper body clothing?: Total Help from another person to put on and taking off regular lower body clothing?: Total 6 Click Score: 6    End of Session Equipment Utilized During Treatment: Oxygen  OT Visit Diagnosis: Unsteadiness on feet (R26.81);Other abnormalities of gait and mobility (R26.89);Muscle weakness (generalized) (M62.81);Low vision, both eyes (H54.2);Other symptoms and signs involving cognitive function;Pain   Activity Tolerance Patient limited by lethargy   Patient Left in chair;with call bell/phone within reach;with chair alarm set;with family/visitor present   Nurse Communication          Time: 1222-4114 OT Time Calculation (min): 23 min  Charges: OT General Charges $OT Visit: 1 Visit OT Treatments $Self Care/Home Management : 8-22 mins  Tyrone Schimke, OT Acute Rehabilitation Services  Office: [phone]   Hortencia Pilar 05/02/2022, 12:48 PM

## 2022-05-02 NOTE — Evaluation (Signed)
Speech Language Pathology Evaluation Patient Details Name: Tara [name] MRN: [id number] DOB: [birth year] Today's Date: 05/02/2022 Time: 7062-3762 SLP Time Calculation (min) (ACUTE ONLY): 10 min  Problem List:  Patient Active Problem List   Diagnosis Date Noted   Palliative care encounter    Acute respiratory failure with hypoxia ([place])    Aspiration into airway    Pressure injury of skin 04/07/2022   Thrombocytopenia ([place])    Meningioma ([place]) 03/24/2022   Seizure ([place]) 03/15/2022   Atrial fibrillation, chronic ([place]) 03/15/2022   Protein-calorie malnutrition, moderate ([place]) 03/15/2022   Atrial fibrillation with RVR (HCC)    Metabolic acidosis 83/15/1761   Acute renal failure superimposed on stage 3b chronic kidney disease ([place]) 02/27/2022   Anxiety and depression 02/27/2022   Meningioma, cerebral ([place]) 02/27/2022   Normocytic anemia 02/27/2022   Acute renal failure (ARF) ([place]) 02/27/2022   Hemorrhoids 01/04/2021   Ankle deformity, right 09/12/2020   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   Port-A-Cath in place 09/28/2018   Colon adenomas 08/06/2018   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 ([place]) 06/09/2018   Hypertension 01/25/2018   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   Demand ischemia (HCC)    Prolonged Q-T interval on ECG 09/29/2016   Chronic kidney disease (CKD), stage III (moderate) (HCC) 09/29/2016   Leukocytosis 09/18/2016   Abnormal CT scan, chest    Upper airway cough syndrome  vs Cough variant asthma  04/09/2016   Seasonal allergies 08/08/2014   Family history of colon cancer 03/30/2014   Osteoporosis 60/73/7106   Metabolic syndrome X 26/94/8546   SVT (supraventricular tachycardia) ([place]) 27/11/5007   Chronic systolic CHF (congestive heart failure) ([place]) 03/14/2013   Vitamin D deficiency 07/23/2010   Morbid obesity ([place]) 07/04/2009   Malignant neoplasm of right breast ([place]) 12/13/2007   Hyperlipidemia 12/13/2007   Anxiety state  12/13/2007   Depression, major, single episode, in partial remission ([place]) 12/13/2007   GERD 12/13/2007   Past Medical History:  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   Anxiety    Breast cancer ([place]) 2008   right - s/p lumpectomy->chemo, radiation   Depression    Dysrhythmia    hx SVT   Family history of colon cancer    Family history of prostate cancer    GERD (gastroesophageal reflux disease)    H/O: hysterectomy    Hiatal hernia    History of cancer chemotherapy    History of radiation therapy    Hyperlipidemia    Hypertension 01/25/2018   Nonischemic cardiomyopathy ([place])    Personal history of radiation therapy 01/05/209   SVT (supraventricular tachycardia) ([place])    short RP SVT documented 3/81   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[place])    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)   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   Past Surgical History:  Past Surgical History:  Procedure Laterality Date   ABDOMINAL HYSTERECTOMY  1994   fibroids,    BIOPSY  06/19/2016   Procedure: BIOPSY;  Surgeon: Daneil Dolin, MD;  [facility];  Service: Endoscopy;;  gastric duodenum   BREAST BIOPSY Right 12/16/2006   malignant   BREAST EXCISIONAL BIOPSY Right 2018   benign lumpectomy   BREAST LUMPECTOMY Right 02/2007   BREAST LUMPECTOMY WITH RADIOACTIVE SEED LOCALIZATION Right 07/29/2017   Procedure: RIGHT  BREAST LUMPECTOMY WITH RADIOACTIVE SEED LOCALIZATION;  Surgeon: Fanny Skates, MD;  Location: [place];  Service: General;  Laterality: Right;   BREAST SURGERY Right 2008   lumpectomy, cancer   CARDIAC CATHETERIZATION     CHOLECYSTECTOMY  1999   COLONOSCOPY  2008   Dr. Oneida Alar: multiple polyps. Path not available at time of visit.    COLONOSCOPY WITH PROPOFOL N/A 04/25/2014   Dr. Oneida Alar: Multiple tubular adenomas removed. Diverticulosis. Moderate internal  hemorrhoids. Next colonoscopy planned for August 2018.   CRANIOTOMY Bilateral 03/24/2022   Procedure: Bifrontal craniotomy for resection of meningioma;  Surgeon: Ashok Pall, MD;  Location: [place];  Service: Neurosurgery;  Laterality: Bilateral;   ESOPHAGOGASTRODUODENOSCOPY (EGD) WITH PROPOFOL N/A 06/19/2016   Procedure: ESOPHAGOGASTRODUODENOSCOPY (EGD) WITH PROPOFOL;  Surgeon: Daneil Dolin, MD;  [facility];  Service: Endoscopy;  Laterality: N/A;   MASTECTOMY W/ SENTINEL NODE BIOPSY Right 06/09/2018   Procedure: RIGHT TOTAL MASTECTOMY WITH SENTINEL LYMPH NODE BIOPSY;  Surgeon: Fanny Skates, MD;  Location: [place];  Service: General;  Laterality: Right;   POLYPECTOMY N/A 04/25/2014   Procedure: POLYPECTOMY;  Surgeon: Danie Binder, MD;  Location: [facility];  Service: Endoscopy;  Laterality: N/A;  Ascending and Decending Colon x3 , Transverse colon x2, rectal   PORTACATH PLACEMENT N/A 06/09/2018   Procedure: INSERTION PORT-A-CATH;  Surgeon: Fanny Skates, MD;  Location: [place];  Service: General;  Laterality: N/A;   RIGHT/LEFT HEART CATH AND CORONARY ANGIOGRAPHY N/A 07/10/2017   Procedure: RIGHT/LEFT HEART CATH AND CORONARY ANGIOGRAPHY;  Surgeon: Larey Dresser, MD;  Location: [facility];  Service: Cardiovascular;  Laterality: N/A;   HPI:  Pt is a 76 y/o who presented 6/24 with new onset seizures. 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 EEG negative for seizures. Neurosurgey consulted and did not recommend meningioma resection as of 6/27. Decline in swallow function noted overnight 6/27. Dx acute toxic metabolic encephalopathy suspected secondary to keppra. Pt s/p Bifrontal craniotomy for resection of meningioma 7/3.  Cortrak 7/7. 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  CXR 8/6 concerning for patchy airspace disease. PMH: HTN, HLD, GERD, CKD stage IIIb, chronic HFpEF, meningioma, breast CA-s/p right lumpectomy and chemoradiation in 2008,  depression/anxiety. BSE 03/11/22: regular texture diet and thin liquids without need for follow up.   Assessment / Plan / Recommendation Clinical Impression  Pt demonstrates significant cognitive communication impairments marked by decreased initiation of motor and speech abilities, flat affect and delayed processing. She frequently keeps eyes closed and verbal/tactile stimulation is sometimes effective. Mrs. Feltman did not follow commands or attempt to hold cup or spoon when presented. She required hand over hand for all movement. Husband present and reports she has been talking to him for past several days but was given seizure meds "and is not doing much today." Pt would benefit from continued ST to maximize function.    SLP Assessment  SLP Recommendation/Assessment: Patient needs continued Speech [place] [facility] SLP Visit Diagnosis: Cognitive communication deficit (R41.841)    Recommendations for follow up therapy are one component of a multi-disciplinary discharge planning process, led by the attending physician.  Recommendations may be updated based on patient status, additional functional criteria and insurance authorization.    Follow Up Recommendations  Acute inpatient rehab (3hours/day)    Assistance Recommended at Discharge  Frequent or constant Supervision/Assistance  Functional Status Assessment Patient has had a recent decline in their functional status and demonstrates the ability to make significant improvements  in function in a reasonable and predictable amount of time.  Frequency and Duration min 2x/week  2 weeks      SLP Evaluation Cognition  Overall Cognitive Status: Impaired/Different from baseline Arousal/Alertness:  (waxes and wanes) Orientation Level:  (no response) Attention: Sustained Sustained Attention: Impaired Sustained Attention Impairment: Verbal basic Memory:  (TBD) Awareness: Impaired Awareness Impairment: Intellectual impairment;Emergent  impairment;Anticipatory impairment Problem Solving: Impaired Problem Solving Impairment: Functional basic Safety/Judgment: Impaired       Comprehension  Auditory Comprehension Overall Auditory Comprehension:  (no response today to commands- will continue to assess) Interfering Components: Attention;Processing speed Visual Recognition/Discrimination Discrimination: Not tested Reading Comprehension Reading Status: Not tested    Expression Expression Primary Mode of Expression: Verbal Verbal Expression Overall Verbal Expression:  (no vocalizations) Initiation: Impaired Level of Generative/Spontaneous Verbalization: Word Repetition:  (no response) Naming: Not tested Pragmatics: Impairment Impairments: Eye contact Written Expression Dominant Hand: Right Written Expression: Not tested   Oral / Motor  Oral Motor/Sensory Function Overall Oral Motor/Sensory Function:  (appears symmetrical- did not follow commands) Motor Speech Overall Motor Speech:  (TBA) Motor Planning:  (TBA)            Houston Siren 05/02/2022, 3:51 PM

## 2022-05-02 NOTE — Progress Notes (Signed)
[facility] PROGRESS NOTE  Patient: Tara [name] PJK:[id number]   PCP: [name], MD DOB: 24-Nov-1945   DOA: 03/15/2022   DOS: 05/02/2022    Subjective: Patient is sleepy.  Per husband patient received IV Ativan yesterday and since have been remaining sleepy.  She still eats.  She still communicates with the family.  No other acute events overnight.  Objective:   Vitals:   05/02/22 0750 05/02/22 1101 05/02/22 1150 05/02/22 1506  BP: 136/84  124/73 130/63  Pulse: (!) 102  93 90  Resp: 20 16 (!) 21 18  Temp: 98.2 F (36.8 C)  98.6 F (37 C) 98.7 F (37.1 C)  TempSrc: Oral  Oral Oral  SpO2: 98%  95% 98%  Weight:      Height:      General: Appear in mild distress; no visible Abnormal Neck Mass Or lumps, Conjunctiva normal Cardiovascular: S1 and S2 Present, no Murmur, Respiratory: good respiratory effort, Bilateral Air entry present and CTA, no Crackles, no wheezes Abdomen: Bowel Sound present, Non tender  Extremities: no Pedal edema Neurology: Drowsy and non verbal Gait not checked due to patient safety concerns   Assessment and plan: Active Issues Dysphagia. Moderate protein calorie malnutrition. Currently has a core track. Calorie count roughly 700 cal/day. Continuing tube feeding with nocturnal feedings. Hopefully over the next few days tube feeds can be discontinued.   Drowsiness. In response to receiving IV Ativan yesterday. Unsure whether the patient actually had a seizure-like event yesterday or not. It does not appear that anybody was notified about this need for medication. Continue monitor for now.  Brief Hospital course 76 year old African-American female PMH of anxiety, depression, type II DM, HLD, PAF, on Eliquis, CKD 3B, breast cancer SP chemoradiation presented to hospital on 6/24 with new onset seizures. Known history of sphenoidal meningioma that was noted to be increased in size on the CT scan with surrounding edema so she is transferred  from [facility] for further evaluation.  She underwent LTM with no further seizures and was evaluated by neurosurgery and underwent bifrontal craniotomy for meningioma resection on 03/24/2020 and was transferred to the intensive care unit postoperatively with PCCM consulting while she was in ICU. Significant Hospital Events: 6/24 presented to [facility] ED with new onset seizures, CTH with enlarging meningioma with edema, loaded with Keppra 7/3 Underwent bifrontal craniotomy for meningioma 7/6 CTH with new 7 mm thick subdural hematoma at the foramen magnum and tracking adjacent to the basion and odontoid not currently causing critical stenosis of the foramen magnum per radiology read, but merits surveillance. 7/11 recurrent sz like activity. Not correlated on EEG. CT unchanged.  7/13 neurology signed off. 7/21 palliative care was consulted. 7/23 CT chest and abdomen was performed negative for PE or significant pneumonia, without any hemorrhage 7/25 MRI brain without contrast shows increasing mass effect, SDH as well as subacute/acute small scattered infarct. 7/26 tube feeds resumed, neurology reconsulted.  Eliquis resumed per neurosurgery 7/27 oxygenation improving, mentation improving, neurology following.  CTA head and neck without any LVO.  Lower extremity Doppler without any DVT.  Echo shows new systolic dysfunction with global hypokinesis EF 40 to 45% and significant RV failure. 7/29 oxygenation improving to 6 L saturating 94%. 7/30 had another aspiration event. Tube feeds on hold. Now on NRB. Remains full code. No tracheostomy per husband. Per Neurosurgery, give at least two weeks if not more to see if she improves.  8/1 oxygenation improving again to 5 L.  Trickle tube  feeds started. 8/9 still on 10 L of oxygen although mentation significantly better.  Currently on D1 honey thick liquid diet.  Calorie count initiated.   Principal Problem:   Seizure ([place]) Active Problems:   Meningioma, cerebral  (HCC)   Leukocytosis   Anxiety and depression   GERD   Hyperlipidemia   Atrial fibrillation, chronic (HCC)   Protein-calorie malnutrition, moderate (HCC)   Meningioma (HCC)   Thrombocytopenia (HCC)   Pressure injury of skin   Acute respiratory failure with hypoxia (HCC)   Aspiration into airway   Palliative care encounter   Author: Berle Mull, MD [facility] 05/02/2022 4:57 PM   If 7PM-7AM, please contact night-coverage at [email or web address]

## 2022-05-03 DIAGNOSIS — R569 Unspecified convulsions: Secondary | ICD-10-CM | POA: Diagnosis not present

## 2022-05-03 LAB — CBC
HCT: 28.5 % — ABNORMAL LOW (ref 36.0–46.0)
Hemoglobin: 8.9 g/dL — ABNORMAL LOW (ref 12.0–15.0)
MCH: 27.1 pg (ref 26.0–34.0)
MCHC: 31.2 g/dL (ref 30.0–36.0)
MCV: 86.9 fL (ref 80.0–100.0)
Platelets: 184 10*3/uL (ref 150–400)
RBC: 3.28 MIL/uL — ABNORMAL LOW (ref 3.87–5.11)
RDW: 17.4 % — ABNORMAL HIGH (ref 11.5–15.5)
WBC: 14.6 10*3/uL — ABNORMAL HIGH (ref 4.0–10.5)
nRBC: 0 % (ref 0.0–0.2)

## 2022-05-03 LAB — BASIC METABOLIC PANEL
Anion gap: 9 (ref 5–15)
BUN: 33 mg/dL — ABNORMAL HIGH (ref 8–23)
CO2: 27 mmol/L (ref 22–32)
Calcium: 8.9 mg/dL (ref 8.9–10.3)
Chloride: 103 mmol/L (ref 98–111)
Creatinine, Ser: 0.91 mg/dL (ref 0.44–1.00)
GFR, Estimated: >60 mL/min (ref >60)
Glucose, Bld: 153 mg/dL — ABNORMAL HIGH (ref 70–99)
Potassium: 4.5 mmol/L (ref 3.5–5.1)
Sodium: 139 mmol/L (ref 135–145)

## 2022-05-03 LAB — GLUCOSE, CAPILLARY
Glucose-Capillary: 183 mg/dL — ABNORMAL HIGH (ref 70–99)
Glucose-Capillary: 138 mg/dL — ABNORMAL HIGH (ref 70–99)
Glucose-Capillary: 139 mg/dL — ABNORMAL HIGH (ref 70–99)
Glucose-Capillary: 157 mg/dL — ABNORMAL HIGH (ref 70–99)

## 2022-05-03 NOTE — Progress Notes (Signed)
Subjective: Patient reports  nonverbal this morning but moving spontaneously husband reports stable to improved over the last day or 2  Objective: Vital signs in last 24 hours: Temp:  [98 F (36.7 C)-98.9 F (37.2 C)] 98 F (36.7 C) (08/12 0306) Pulse Rate:  [84-96] 84 (08/12 0306) Resp:  [16-24] 22 (08/12 0306) BP: (105-130)/(56-73) 117/61 (08/12 0306) SpO2:  [95 %-100 %] 100 % (08/12 0306) Weight:  [73.6 kg] 73.6 kg (08/12 0345)  Intake/Output from previous day: 08/11 0701 - 08/12 0700 In: 700 [P.O.:700] Out: 1400 [Urine:1400] Intake/Output this shift: No intake/output data recorded.  Moves all extremities awaiting placement  Lab Results: Recent Labs    05/03/22 0620  WBC 14.6*  HGB 8.9*  HCT 28.5*  PLT 184   BMET Recent Labs    05/03/22 0620  NA 139  K 4.5  CL 103  CO2 27  GLUCOSE 153*  BUN 33*  CREATININE 0.91  CALCIUM 8.9    Studies/Results: No results found.  Assessment/Plan: Discussed with occupational therapy working on placement  LOS: 49 days     Elaina Hoops 05/03/2022, 7:59 AM

## 2022-05-03 NOTE — Progress Notes (Signed)
[facility] PROGRESS NOTE  Patient: Tara [name] RFF:[id number]   PCP: [name], MD DOB: 04-04-1946   DOA: 03/15/2022   DOS: 05/03/2022    Subjective: Continued appears to be improving.  Patient able to interact more today.  No acute events overnight.  Objective:   Today's Vitals   05/03/22 0950 05/03/22 1159 05/03/22 1507 05/03/22 1906  BP:  (!) 107/59 (!) 126/59   Pulse:  88 80   Resp:  16 18   Temp:  98.8 F (37.1 C) 99 F (37.2 C) 98.3 F (36.8 C)  TempSrc:  Oral Axillary Axillary  SpO2:  97% 100%   Weight:      Height:      PainSc: Asleep      Body mass index is 35.11 kg/m.  General: Appear in mild distress; no visible Abnormal Neck Mass Or lumps, Conjunctiva normal Cardiovascular: S1 and S2 Present, no Murmur, Respiratory: good respiratory effort, Bilateral Air entry present and CTA, no Crackles, no wheezes Abdomen: Bowel Sound present, Non tender  Extremities: no Pedal edema Neurology: alert and answered to simple questions today.  Did not follow any commands stop  Assessment and plan: Active Issues Dysphagia. Continuing tube feeding for now.  Drowsiness but Improving. Monitor.  Brief Hospital course 76 year old African-American female PMH of anxiety, depression, type II DM, HLD, PAF, on Eliquis, CKD 3B, breast cancer SP chemoradiation presented to hospital on 6/24 with new onset seizures. Known history of sphenoidal meningioma that was noted to be increased in size on the CT scan with surrounding edema so she is transferred from [facility] for further evaluation.  She underwent LTM with no further seizures and was evaluated by neurosurgery and underwent bifrontal craniotomy for meningioma resection on 03/24/2020 and was transferred to the intensive care unit postoperatively with PCCM consulting while she was in ICU. Significant Hospital Events: 6/24 presented to [facility] with new onset seizures, CTH with enlarging meningioma with edema, loaded with  Keppra 7/3 Underwent bifrontal craniotomy for meningioma 7/6 CTH with new 7 mm thick subdural hematoma at the foramen magnum and tracking adjacent to the basion and odontoid not currently causing critical stenosis of the foramen magnum per radiology read, but merits surveillance. 7/11 recurrent sz like activity. Not correlated on EEG. CT unchanged.  7/13 neurology signed off. 7/21 palliative care was consulted. 7/23 CT chest and abdomen was performed negative for PE or significant pneumonia, without any hemorrhage 7/25 MRI brain without contrast shows increasing mass effect, SDH as well as subacute/acute small scattered infarct. 7/26 tube feeds resumed, neurology reconsulted.  Eliquis resumed per neurosurgery 7/27 oxygenation improving, mentation improving, neurology following.  CTA head and neck without any LVO.  Lower extremity Doppler without any DVT.  Echo shows new systolic dysfunction with global hypokinesis EF 40 to 45% and significant RV failure. 7/29 oxygenation improving to 6 L saturating 94%. 7/30 had another aspiration event. Tube feeds on hold. Now on NRB. Remains full code. No tracheostomy per husband. Per Neurosurgery, give at least two weeks if not more to see if she improves.  8/1 oxygenation improving again to 5 L.  Trickle tube feeds started. 8/9 still on 10 L of oxygen although mentation significantly better.  Currently on D1 honey thick liquid diet.  Calorie count initiated.   Principal Problem:   Seizure ([place]) Active Problems:   Meningioma, cerebral (HCC)   Leukocytosis   Anxiety and depression   GERD   Hyperlipidemia   Atrial fibrillation, chronic (HCC)  Protein-calorie malnutrition, moderate (HCC)   Meningioma (HCC)   Thrombocytopenia (HCC)   Pressure injury of skin   Acute respiratory failure with hypoxia (HCC)   Aspiration into airway   Palliative care encounter   Author: Berle Mull, MD [facility] 05/03/2022 8:10 PM   If 7PM-7AM, please contact  night-coverage at [email or web address]

## 2022-05-04 DIAGNOSIS — R569 Unspecified convulsions: Secondary | ICD-10-CM | POA: Diagnosis not present

## 2022-05-04 LAB — GLUCOSE, CAPILLARY
Glucose-Capillary: 128 mg/dL — ABNORMAL HIGH (ref 70–99)
Glucose-Capillary: 111 mg/dL — ABNORMAL HIGH (ref 70–99)
Glucose-Capillary: 159 mg/dL — ABNORMAL HIGH (ref 70–99)

## 2022-05-04 NOTE — Progress Notes (Signed)
[facility] PROGRESS NOTE  Patient: Tara [name] WUJ:[id number]   PCP: [name], MD DOB: [birth year]   DOA: 03/15/2022   DOS: 05/04/2022    Subjective: No nausea no vomiting no fever no chills.  No acute events overnight.  Continues to interact continues to tolerate oral diet.  Objective:   Vitals:   05/04/22 0707 05/04/22 1108 05/04/22 1116 05/04/22 1507  BP: 128/73 106/79  (!) 119/57  Pulse: 96 97  85  Resp: '17 19  20  '$ Temp: 97.8 F (36.6 C) 97.9 F (36.6 C)  98.3 F (36.8 C)  TempSrc: Axillary Axillary  Axillary  SpO2: 95% 91% 96% 93%  Weight:      Height:      S1-S2 present. Clear to auscultation percussion bowel sound present No edema. Interactive but having difficulty following complex commands.  Assessment and plan: Active Issues Dysphagia. Currently has a core track and is receiving tube feedings through that for nocturnal basis. Would like to see if the tube feeding can be discontinued so that the core track can be removed.  Monitor.  Brief Hospital course 76 year old African-American female PMH of anxiety, depression, type II DM, HLD, PAF, on Eliquis, CKD 3B, breast cancer SP chemoradiation presented to hospital on 6/24 with new onset seizures. Known history of sphenoidal meningioma that was noted to be increased in size on the CT scan with surrounding edema so she is transferred from [facility] for further evaluation.  She underwent LTM with no further seizures and was evaluated by neurosurgery and underwent bifrontal craniotomy for meningioma resection on 03/24/2020 and was transferred to the intensive care unit postoperatively with PCCM consulting while she was in ICU. Significant Hospital Events: 6/24 presented to [facility] with new onset seizures, CTH with enlarging meningioma with edema, loaded with Keppra 7/3 Underwent bifrontal craniotomy for meningioma 7/6 CTH with new 7 mm thick subdural hematoma at the foramen magnum and tracking adjacent to the  basion and odontoid not currently causing critical stenosis of the foramen magnum per radiology read, but merits surveillance. 7/11 recurrent sz like activity. Not correlated on EEG. CT unchanged.  7/13 neurology signed off. 7/21 palliative care was consulted. 7/23 CT chest and abdomen was performed negative for PE or significant pneumonia, without any hemorrhage 7/25 MRI brain without contrast shows increasing mass effect, SDH as well as subacute/acute small scattered infarct. 7/26 tube feeds resumed, neurology reconsulted.  Eliquis resumed per neurosurgery 7/27 oxygenation improving, mentation improving, neurology following.  CTA head and neck without any LVO.  Lower extremity Doppler without any DVT.  Echo shows new systolic dysfunction with global hypokinesis EF 40 to 45% and significant RV failure. 7/29 oxygenation improving to 6 L saturating 94%. 7/30 had another aspiration event. Tube feeds on hold. Now on NRB. Remains full code. No tracheostomy per husband. Per Neurosurgery, give at least two weeks if not more to see if she improves.  8/1 oxygenation improving again to 5 L.  Trickle tube feeds started. 8/9 still on 10 L of oxygen although mentation significantly better.  Currently on D1 honey thick liquid diet.  Calorie count initiated. 8/13 oxygenation improving to 2 L.  Mentation significantly improving with frequent appropriate interaction.  Still having difficulty following complex commands.  Tolerating oral diet.   Principal Problem:   Seizure ([place]) Active Problems:   Meningioma, cerebral (HCC)   Leukocytosis   Anxiety and depression   GERD   Hyperlipidemia   Atrial fibrillation, chronic (HCC)   Protein-calorie malnutrition,  moderate (HCC)   Meningioma (HCC)   Thrombocytopenia (HCC)   Pressure injury of skin   Acute respiratory failure with hypoxia (HCC)   Aspiration into airway   Palliative care encounter   Author: Berle Mull, MD [facility] 05/04/2022 6:49 PM    If 7PM-7AM, please contact night-coverage at [email or web address]

## 2022-05-04 NOTE — Progress Notes (Signed)
Neurosurgery Service Progress Note  Subjective: No acute events overnight   Objective: Vitals:   05/04/22 0410 05/04/22 0413 05/04/22 0707 05/04/22 1116  BP: 134/70  128/73   Pulse:   96   Resp: 17  17   Temp: 98.1 F (36.7 C)  97.8 F (36.6 C)   TempSrc: Axillary  Axillary   SpO2:   95% 96%  Weight:  74.7 kg    Height:        Physical Exam: Sitting up in bed, awake/alert, hospitalist evaluating the pt and speaking w/ pt and family while I was rounding  Assessment & Plan:  -no change in neurosurgical plan of care  [name]  05/04/22 2:04 PM

## 2022-05-05 DIAGNOSIS — R569 Unspecified convulsions: Secondary | ICD-10-CM | POA: Diagnosis not present

## 2022-05-05 LAB — GLUCOSE, CAPILLARY
Glucose-Capillary: 136 mg/dL — ABNORMAL HIGH (ref 70–99)
Glucose-Capillary: 139 mg/dL — ABNORMAL HIGH (ref 70–99)
Glucose-Capillary: 140 mg/dL — ABNORMAL HIGH (ref 70–99)
Glucose-Capillary: 144 mg/dL — ABNORMAL HIGH (ref 70–99)
Glucose-Capillary: 156 mg/dL — ABNORMAL HIGH (ref 70–99)

## 2022-05-05 NOTE — Progress Notes (Signed)
Progress Note Patient: Tara [name] FOY:[id number] DOB: Aug 22, 1946 DOA: 03/15/2022  DOS: the patient was seen and examined on 05/05/2022  Brief hospital course: 76 year old African-American female PMH of anxiety, depression, type II DM, HLD, PAF, on Eliquis, CKD 3B, breast cancer SP chemoradiation presented to hospital on 6/24 with new onset seizures. Known history of sphenoidal meningioma that was noted to be increased in size on the CT scan with surrounding edema so she is transferred from [facility] for further evaluation.  She underwent LTM with no further seizures and was evaluated by neurosurgery and underwent bifrontal craniotomy for meningioma resection on 03/24/2020 and was transferred to the intensive care unit postoperatively with PCCM consulting while she was in ICU. 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  Treated with Unasyn for 5 days. 7/11 recurrent sz like activity. Not correlated on EEG. CT unchanged.  7/13 neurology signed off. 7/19 started on ABX for sepsis then transition to Unasyn. 7/21 palliative care was consulted. 7/23 CT chest and abdomen was performed negative for PE or significant pneumonia, without any hemorrhage 7/25 MRI brain without contrast shows increasing mass effect, SDH as well as subacute/acute small scattered infarct. 7/26 tube feeds resumed, neurology reconsulted.  Eliquis resumed per neurosurgery 7/27 oxygenation improving, mentation improving, neurology following.  CTA head and neck without any LVO.  Lower extremity Doppler without any DVT.  Echo shows new systolic dysfunction with global hypokinesis EF 40 to 45% and significant RV failure. 7/30 had another aspiration event. Tube feeds on hold. Now on  NRB. Remains full code. No tracheostomy per husband. Per Neurosurgery, give at least two weeks if not more to see if she improves.  8/1 oxygenation improving again to 5 L.  Trickle tube feeds started. 8/9 on D1 honey thick liquid diet.  Calorie count initiated. 8/13 oxygenation improving to 2 L. 8/14 core track removed nocturnal tube feeds stopped. Assessment and Plan: Sphenoidal meningioma with vasogenic edema and new onset seizures Postoperative SDH, SAH Narrowing of the foramen magnum due SDH On admission Tonic-clonic seizure with no previous history of seizures Initially came to [facility]. transferred to [facility]. Neurosurgery was consulted. S/P bifrontal craniotomy for meningioma resection on 03/24/2022 Patient was started on Keppra and Vimpat.  Neurology was consulted. Underwent multiple EEG as well as LTM.  Last EEG 7/28 negative for any acute seizures. MRI 7/25 shows evidence of SDH and SAH.  Which was present on prior CT as well Apixaban resumed on 7/28 Mentation gradually improving. Management per neurosurgery.   Sepsis in setting of HCAP and Recurrent Aspiration pneumonia  Acute hypoxic respiratory failure Febrile on 7/23 on 100.9 BC x2 NGTD, repeat BC X 2 NGTD  SARS COVID-negative UA unremarkable for infection, UC growing multiple species Chest x-ray showed "New hazy opacities projecting over both upper lobes in the suprahilar regions could reflect developing infection in the correct clinical setting CT chest, abdomen/pelvis, showed no PE, multifocal groundglass opacity likely pulmonary edema. CT abdomen pelvis with no evidence of hemorrhage PCCM was consulted and pt was switched to IV Unasyn. Continue DuoNebs, nebulizers Continue supplemental oxygen as needed Pulmonary toilet with Chest Vest and NT suction. On 7/30 worsening hypoxia and needed NRB.  Most likely another aspiration event. Continue PRN IV lasix.  Currently on oral Lasix. Completed  14-day antibiotic course with last day  on 8/4. Oxygenation improving back to 5 L on 8/10.   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 Currently appears to have severe exacerbation of heart failure. Repeat echocardiogram on 04/17/2022 shows EF dropping to 40 to 45% with global hypokinesis.  RV function also moderately low. CT PE protocol as well as lower extremity Doppler have ruled out VTE/PE Currently on oral Lasix.  Continue midodrine support for hypotension Due to hypotension currently unable to initiate GDMT.   RV dysfunction. PE ruled out.  Possibly due to pneumonia. Discussed with pulmonary. Patient will require repeat echocardiogram in 6 months.   Acute embolic CVA-incidental Patient has history of A-fib. MRI brain shows evidence of restricted diffusion and left cerebellum, bilateral occipital lobe, left parietal lobe, bilateral frontal lobe, concerning for small scattered acute and subacute infarct most likely embolic in the setting of A-fib. Anticoagulation is currently resumed. Echocardiogram shows mildly worsening EF.   Patient currently remains n.p.o. per speech therapy evaluation. PT and OT following.  Recommend SNF. Currently on Crestor.  LDL 106 in April 2023.  Currently due to being on continuous tube feeding lipid panel will not be accurate. Neurology signed off again.   Postoperative acute blood loss anemia-expected. Iron deficiency anemia Acute on chronic thrombocytopenia Hemoglobin dropped to 6.5 on 7/22 Transfused 2 units of PRBC on 7/22 Iron 15, sats 8, ferritin 440, vitamin B12-->356 Continue monitor for signs and symptoms bleeding; no overt bleeding noted Platelet counts now improving. Continue iron support.   Dysphagia Recurrent aspiration Dietitian following speech therapy following.  On tube feeding with core track. Calorie count was  completed and per dietitian patient is taking adequate calories. We will discontinue the core track and monitor.   [place] discussion Due to multiple comorbidities, minimally responsive state, overall poor prognosis Palliative care consulted for further goals of care discussion.  As of 7/30 per neurosurgery, "Would certainly give at least two weeks if not more to see if she improves." Husband does not want the patient to go to tracheostomy.   Hypokalemia Replace as needed   Hypernatremia. In the setting of diuresis. Improving with increasing free water.   Anxiety and Depression Continue with sertraline 25 mg per tube daily    HTN now hypotension   Paroxysmal Atrial Fibrillation Hyperlipidemia Continue Amiodarone 200 mg p.o. per tube daily, and Rosuvastatin 20 mg per tube daily   Type 2 DM controlled with hyperlipidemia and peripheral vascular disease Continue SSI, Accu-Cheks, hypoglycemic protocol Hemoglobin A1c 6.3 in 02/27/2022.   GERD/GI prophylaxis Continue pantoprazole 40 mg per tube daily   Obesity Body mass index is 34.64 kg/m.  Placing the patient at high risk of poor outcome.  She may also have undiagnosed sleep apnea.   Pressure ulcer left buttock stage II not present on admission. Continue wound care foam dressing and frequent turning.  Subjective: Denies any acute complaint.  Interaction continues to improve.  No acute events overnight.  Physical Exam: Vitals:   05/05/22 0706 05/05/22 0846 05/05/22 1102 05/05/22 1508  BP: 124/72 121/70 110/68 133/82  Pulse: 89 95 86 96  Resp: '18 17 18 19  '$ Temp: 98.5 F (36.9 C)  98.8 F (37.1 C) 98.3 F (36.8 C)  TempSrc: Axillary  Axillary Axillary  SpO2: 98% 97% 98% 97%  Weight:      Height:  General: Appear in mild distress; no visible Abnormal Neck Mass Or lumps, Conjunctiva normal Cardiovascular: S1 and S2 Present, no Murmur, Respiratory: good respiratory effort, Bilateral Air entry present and CTA, no  Crackles, no wheezes Abdomen: Bowel Sound present, Non tender  Extremities: no Pedal edema Neurology: alert and not oriented to time, place, and person, not able to follow commands consistently Gait not checked due to patient safety concerns   Data Reviewed: I have Reviewed nursing notes, Vitals, and Lab results since pt's last encounter. Pertinent lab results CBG    Family Communication: Sister at bedside  Disposition: Status is: Inpatient Remains inpatient appropriate because: Need to monitor adequacy of oral nutrition very well up to 2 place.  Author: Berle Mull, MD 05/05/2022 8:01 PM  Please look on [email or web address] to find out who is on call.

## 2022-05-05 NOTE — TOC Progression Note (Signed)
Transition of Care [facility]) - Progression Note    Patient Details  Name: Tara [name] MRN: [id number] Date of Birth: 1946-04-05  Transition of Care [facility]) CM/SW [place], RN Phone Number:[phone]  05/05/2022, 1:48 PM  Clinical Narrativ TOC continues to follow. No clear disposition plan identified. Patient continues to have cortrack and is tolerating diet. CIR had noted that patient is not appropriate for CIR intensity at this point. Current recommendation per therapy notes is SNF.  There has been no SNF workup at this point due to documentation that family will not want to place patient in SNF.  TOC does not want family to feel that we are going against there wishes. TOC is following for a discharge plan when patient is deemed medically stable.    Expected Discharge Plan: [place] Barriers to Discharge: Continued Medical Work up, Ship broker  Expected Discharge Plan and Services Expected Discharge Plan: [place]   Discharge Planning Services: CM Consult   Living arrangements for the past 2 months: [place]: [place]         Social Determinants of Health (SDOH) Interventions    Readmission Risk Interventions    03/06/2022   12:36 PM  Readmission Risk Prevention Plan  Transportation Screening Complete  HRI or Home Care Consult Complete  Social Work Consult for [place] Planning/Counseling Complete

## 2022-05-05 NOTE — Progress Notes (Signed)
Patient ID: Tara [name], female   DOB: May 07, 1946, 76 y.o.   MRN: [id number] BP 133/82   Pulse 96   Temp 98.3 F (36.8 C) (Axillary)   Resp 19   Ht '4\' 9"'$  (1.448 m)   Wt 74.7 kg   SpO2 97%   BMI 35.64 kg/m  Alert, oriented to person Moving all extremities Pt now recommending inpatient rehabilitation She is progressing well

## 2022-05-05 NOTE — Progress Notes (Signed)
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 s/p tumor resection 7/3. Recent DC from [facility] 6/22. MRI on 7/26 shows: SDH extending inferiorly along the clivus and  narrowing the foramen magnum with possible mass effect. PMH: CKD stage 3, HFpEF, a fib, meningioma.    PT Comments    Patient progressing this session with level of arousal and verbalizing as well as with sitting balance and transfers.  She was able to stand with max A and transferred with +2 to recliner.  Family present and supportive and evidently refusing SNF.  Feel progress is indicative she may benefit from acute inpatient rehab to maximize mobility and educate family to allow d/c home.  PT to continue to follow acutely.    Recommendations for follow up therapy are one component of a multi-disciplinary discharge planning process, led by the attending physician.  Recommendations may be updated based on patient status, additional functional criteria and insurance authorization.  Follow Up Recommendations  Acute inpatient rehab (3hours/day) Can patient physically be transported by private vehicle: No   Assistance Recommended at Discharge Frequent or constant Supervision/Assistance  Patient can return home with the following Two people to help with walking and/or transfers;Two people to help with bathing/dressing/bathroom;Assist for transportation;Assistance with feeding;Assistance with Education officer, environmental (measurements PT);Wheelchair cushion (measurements PT);Hospital bed;Other (comment);BSC/3in1 (hoyer lift)    Recommendations for Other Services       Precautions / Restrictions Precautions Precautions: Fall Precaution Comments: seizures, multiple lines     Mobility  Bed Mobility Overal bed mobility: Needs  Assistance Bed Mobility: Rolling, Supine to Sit Rolling: Max assist   Supine to sit: Total assist, HOB elevated, +2 for physical assistance     General bed mobility comments: assist for legs off bed, scooting hips and lifting trunk    Transfers Overall transfer level: Needs assistance Equipment used: 2 person hand held assist, None Transfers: Sit to/from Stand, Bed to chair/wheelchair/BSC Sit to Stand: Max assist, +2 safety/equipment     Squat pivot transfers: +2 physical assistance, Max assist     General transfer comment: up at EOB and pt with arms held tight to her sides, worked to reduce tone and maintain sitting balance, then performed sit to stand x 3 with knees and hips flexed, but maintaining weight through her legs, then pivot with +2 A to recliner.    Ambulation/Gait                   Stairs             Wheelchair Mobility    Modified Rankin (Stroke Patients Only)       Balance Overall balance assessment: Needs assistance   Sitting balance-Leahy Scale: Poor Sitting balance - Comments: initially needing mod to min support for balance at EOB but progressed to S to minguard A.   Standing balance support: Single extremity supported Standing balance-Leahy Scale: Zero Standing balance comment: max support for standing                            Cognition Arousal/Alertness: Lethargic Behavior During Therapy: Flat affect Overall Cognitive Status: Impaired/Different from baseline Area of Impairment: Attention, Following commands, Safety/judgement, Problem solving  Current Attention Level: Focused   Following Commands: Follows one step commands inconsistently, Follows one step commands with increased time Safety/Judgement: Decreased awareness of deficits, Decreased awareness of safety   Problem Solving: Slow processing, Decreased initiation, Difficulty sequencing, Requires verbal cues, Requires tactile  cues General Comments: sister and neice present; patient verbalizing back what was asked (What is your sister's name?)        Exercises Other Exercises Other Exercises: PROM bilateral UE's with tone inhibition techniques Other Exercises: PROM cervical    General Comments General comments (skin integrity, edema, etc.): VSS on 3-4L O2; rolled initially for hygiene and gown change; sister and neice present      Pertinent Vitals/Pain Pain Assessment Pain Assessment: Faces Faces Pain Scale: No hurt    Home Living                          Prior Function            PT Goals (current goals can now be found in the care plan section) Progress towards PT goals: Progressing toward goals    Frequency    Min 3X/week      PT Plan Current plan remains appropriate    Co-evaluation              AM-PAC PT "6 Clicks" Mobility   Outcome Measure  Help needed turning from your back to your side while in a flat bed without using bedrails?: Total Help needed moving from lying on your back to sitting on the side of a flat bed without using bedrails?: Total Help needed moving to and from a bed to a chair (including a wheelchair)?: Total Help needed standing up from a chair using your arms (e.g., wheelchair or bedside chair)?: Total Help needed to walk in hospital room?: Total Help needed climbing 3-5 steps with a railing? : Total 6 Click Score: 6    End of Session Equipment Utilized During Treatment: Gait belt;Oxygen Activity Tolerance: Patient tolerated treatment well Patient left: in chair;with call bell/phone within reach;with family/visitor present;with chair alarm set Nurse Communication: Mobility status PT Visit Diagnosis: Other abnormalities of gait and mobility (R26.89);Other symptoms and signs involving the nervous system (R29.898)     Time: 4492-0100 PT Time Calculation (min) (ACUTE ONLY): 32 min  Charges:  $Therapeutic Activity: 23-37 mins                      Magda Kiel, PT Acute Rehabilitation Services Office:[phone] 05/05/2022    Tara [name] 05/05/2022, 7:03 PM

## 2022-05-05 NOTE — Progress Notes (Signed)
Per MD order cortrak removed. Pt tolerated well.

## 2022-05-05 NOTE — Progress Notes (Signed)
Nutrition Follow-up  DOCUMENTATION CODES:   Obesity unspecified  INTERVENTION:   Recommend d/c cortrak tube  Magic cup with all meals  Thickened milk and juice with all meals   NUTRITION DIAGNOSIS:   Inadequate oral intake related to dysphagia as evidenced by  (altered texture diet; meal completion <75%). Progressing.   GOAL:   Patient will meet greater than or equal to 90% of their needs Progressing.   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  Calorie count completed through the weekend. Per documentation pt is consuming >80% of all of her meals. This includes magic cup and honey thickened milk and juice which was added to all meal trays.   Discussed results with MD, plan to remove cortrak tube.  Spoke with sister who was at bedside.  SLP following pt, no changes to her diet for now.   Medications reviewed and include: lasix, SSI, MVI with minerals, protonix, miralax, senokot   Labs reviewed CBG's: 128-159  Diet Order:   Diet Order             DIET - DYS 1 Room service appropriate? Yes; Fluid consistency: Honey Thick  Diet effective now                   EDUCATION NEEDS:   Not appropriate for education at this time  Skin:  Skin Assessment: Skin Integrity Issues: Skin Integrity Issues:: Stage II Stage II: buttocks  Last BM:  8/14 medium, BM regimen meds held today  Height:   Ht Readings from Last 1 Encounters:  03/15/22 '4\' 9"'$  (1.448 m)    Weight:   Wt Readings from Last 1 Encounters:  05/04/22 74.7 kg    BMI:  Body mass index is 35.64 kg/m.  Estimated Nutritional Needs:   Kcal:  1500-1700  Protein:  85-100 grams  Fluid:  >1.5 L/day  Lockie Pares., RD, LDN, CNSC See AMiON for contact  information

## 2022-05-06 ENCOUNTER — Inpatient Hospital Stay (HOSPITAL_COMMUNITY): Payer: Medicare Other

## 2022-05-06 DIAGNOSIS — R140 Abdominal distension (gaseous): Secondary | ICD-10-CM

## 2022-05-06 DIAGNOSIS — R935 Abnormal findings on diagnostic imaging of other abdominal regions, including retroperitoneum: Secondary | ICD-10-CM

## 2022-05-06 DIAGNOSIS — R569 Unspecified convulsions: Secondary | ICD-10-CM | POA: Diagnosis not present

## 2022-05-06 LAB — CBC
HCT: 28.8 % — ABNORMAL LOW (ref 36.0–46.0)
Hemoglobin: 8.9 g/dL — ABNORMAL LOW (ref 12.0–15.0)
MCH: 27.1 pg (ref 26.0–34.0)
MCHC: 30.9 g/dL (ref 30.0–36.0)
MCV: 87.5 fL (ref 80.0–100.0)
Platelets: 250 10*3/uL (ref 150–400)
RBC: 3.29 MIL/uL — ABNORMAL LOW (ref 3.87–5.11)
RDW: 17.1 % — ABNORMAL HIGH (ref 11.5–15.5)
WBC: 20 10*3/uL — ABNORMAL HIGH (ref 4.0–10.5)
nRBC: 0 % (ref 0.0–0.2)

## 2022-05-06 LAB — BASIC METABOLIC PANEL
Anion gap: 12 (ref 5–15)
BUN: 40 mg/dL — ABNORMAL HIGH (ref 8–23)
CO2: 26 mmol/L (ref 22–32)
Calcium: 9.2 mg/dL (ref 8.9–10.3)
Chloride: 103 mmol/L (ref 98–111)
Creatinine, Ser: 1.06 mg/dL — ABNORMAL HIGH (ref 0.44–1.00)
GFR, Estimated: 54 mL/min — ABNORMAL LOW (ref >60)
Glucose, Bld: 125 mg/dL — ABNORMAL HIGH (ref 70–99)
Potassium: 3.7 mmol/L (ref 3.5–5.1)
Sodium: 141 mmol/L (ref 135–145)

## 2022-05-06 LAB — GLUCOSE, CAPILLARY
Glucose-Capillary: 118 mg/dL — ABNORMAL HIGH (ref 70–99)
Glucose-Capillary: 189 mg/dL — ABNORMAL HIGH (ref 70–99)

## 2022-05-06 MED ORDER — HYDROCODONE-ACETAMINOPHEN 5-325 MG PO TABS: 1.0000 | ORAL_TABLET | ORAL | Status: DC | PRN

## 2022-05-06 MED ORDER — ONDANSETRON HCL 4 MG PO TABS
4.0000 mg | ORAL_TABLET | Freq: Four times a day (QID) | ORAL | Status: DC | PRN
  Administered 2022-05-10: 4 mg via ORAL
  Filled 2022-05-06: qty 1

## 2022-05-06 MED ORDER — MONTELUKAST SODIUM 10 MG PO TABS
10.0000 mg | ORAL_TABLET | Freq: Every day | ORAL | Status: DC
  Administered 2022-05-06 – 2022-05-30 (×25): 10 mg via ORAL
  Filled 2022-05-06 (×27): qty 1

## 2022-05-06 MED ORDER — LEVETIRACETAM 500 MG PO TABS
1000.0000 mg | ORAL_TABLET | Freq: Two times a day (BID) | ORAL | Status: DC
  Administered 2022-05-06 – 2022-05-15 (×19): 1000 mg via ORAL
  Filled 2022-05-06 (×19): qty 2

## 2022-05-06 MED ORDER — ACETAMINOPHEN 325 MG PO TABS
650.0000 mg | ORAL_TABLET | Freq: Four times a day (QID) | ORAL | Status: DC | PRN
  Administered 2022-05-09 – 2022-05-31 (×7): 650 mg via ORAL
  Filled 2022-05-06 (×8): qty 2

## 2022-05-06 MED ORDER — ONDANSETRON HCL 4 MG/2ML IJ SOLN
4.0000 mg | Freq: Four times a day (QID) | INTRAMUSCULAR | Status: DC | PRN
  Administered 2022-05-20 – 2022-05-21 (×2): 4 mg via INTRAVENOUS
  Filled 2022-05-06 (×2): qty 2

## 2022-05-06 MED ORDER — FUROSEMIDE 40 MG PO TABS
40.0000 mg | ORAL_TABLET | ORAL | Status: DC
  Administered 2022-05-08 – 2022-05-20 (×7): 40 mg via ORAL
  Filled 2022-05-06 (×8): qty 1

## 2022-05-06 MED ORDER — GUAIFENESIN 100 MG/5ML PO LIQD: 5.0000 mL | ORAL | Status: DC | PRN

## 2022-05-06 MED ORDER — ACETAMINOPHEN 650 MG RE SUPP: 650.0000 mg | Freq: Four times a day (QID) | RECTAL | Status: DC | PRN

## 2022-05-06 MED ORDER — IOHEXOL 300 MG/ML  SOLN
100.0000 mL | Freq: Once | INTRAMUSCULAR | Status: AC | PRN
  Administered 2022-05-06: 100 mL via INTRAVENOUS

## 2022-05-06 MED ORDER — ADULT MULTIVITAMIN W/MINERALS CH
1.0000 | ORAL_TABLET | Freq: Every day | ORAL | Status: DC
  Administered 2022-05-06 – 2022-06-10 (×36): 1 via ORAL
  Filled 2022-05-06 (×36): qty 1

## 2022-05-06 MED ORDER — POLYETHYLENE GLYCOL 3350 17 G PO PACK: 17.0000 g | PACK | Freq: Every day | ORAL | Status: DC

## 2022-05-06 MED ORDER — POLYETHYLENE GLYCOL 3350 17 G PO PACK: 17.0000 g | PACK | Freq: Every day | ORAL | Status: DC | PRN

## 2022-05-06 MED ORDER — FUROSEMIDE 40 MG PO TABS
40.0000 mg | ORAL_TABLET | Freq: Every day | ORAL | Status: DC
  Administered 2022-05-06: 40 mg via ORAL
  Filled 2022-05-06: qty 1

## 2022-05-06 MED ORDER — IOHEXOL 9 MG/ML PO SOLN
500.0000 mL | ORAL | Status: AC
  Administered 2022-05-06 (×2): 500 mL via ORAL

## 2022-05-06 MED ORDER — ONDANSETRON HCL 4 MG PO TABS
4.0000 mg | ORAL_TABLET | Freq: Two times a day (BID) | ORAL | Status: DC
  Administered 2022-05-06: 4 mg via ORAL
  Filled 2022-05-06: qty 1

## 2022-05-06 MED ORDER — APIXABAN 5 MG PO TABS
5.0000 mg | ORAL_TABLET | Freq: Two times a day (BID) | ORAL | Status: DC
  Administered 2022-05-06 – 2022-06-10 (×71): 5 mg via ORAL
  Filled 2022-05-06 (×74): qty 1

## 2022-05-06 MED ORDER — LORAZEPAM 2 MG/ML IJ SOLN
1.0000 mg | INTRAMUSCULAR | Status: DC | PRN
Start: 2022-05-06 — End: 2022-05-31

## 2022-05-06 MED ORDER — PANTOPRAZOLE SODIUM 40 MG PO TBEC
40.0000 mg | DELAYED_RELEASE_TABLET | Freq: Two times a day (BID) | ORAL | Status: DC
  Administered 2022-05-06 – 2022-05-28 (×45): 40 mg via ORAL
  Filled 2022-05-06 (×49): qty 1

## 2022-05-06 MED ORDER — ROSUVASTATIN CALCIUM 20 MG PO TABS
20.0000 mg | ORAL_TABLET | Freq: Every day | ORAL | Status: DC
  Administered 2022-05-06 – 2022-06-10 (×36): 20 mg via ORAL
  Filled 2022-05-06 (×36): qty 1

## 2022-05-06 MED ORDER — SENNA 8.6 MG PO TABS: 1.0000 | ORAL_TABLET | Freq: Two times a day (BID) | ORAL | Status: DC

## 2022-05-06 MED ORDER — SERTRALINE HCL 50 MG PO TABS
25.0000 mg | ORAL_TABLET | Freq: Every day | ORAL | Status: DC
  Administered 2022-05-06: 25 mg via ORAL
  Filled 2022-05-06: qty 1

## 2022-05-06 MED ORDER — SENNOSIDES-DOCUSATE SODIUM 8.6-50 MG PO TABS
1.0000 | ORAL_TABLET | Freq: Two times a day (BID) | ORAL | Status: DC
  Administered 2022-05-06 – 2022-06-10 (×64): 1 via ORAL
  Filled 2022-05-06 (×67): qty 1

## 2022-05-06 MED ORDER — SENNOSIDES-DOCUSATE SODIUM 8.6-50 MG PO TABS: 1.0000 | ORAL_TABLET | Freq: Every evening | ORAL | Status: DC | PRN

## 2022-05-06 MED ORDER — AMIODARONE HCL 200 MG PO TABS
200.0000 mg | ORAL_TABLET | Freq: Every day | ORAL | Status: DC
  Administered 2022-05-06 – 2022-06-10 (×36): 200 mg via ORAL
  Filled 2022-05-06 (×36): qty 1

## 2022-05-06 MED ORDER — MIDODRINE HCL 5 MG PO TABS
5.0000 mg | ORAL_TABLET | Freq: Two times a day (BID) | ORAL | Status: DC
Start: 2022-05-06 — End: 2022-05-23
  Administered 2022-05-06 – 2022-05-23 (×34): 5 mg via ORAL
  Filled 2022-05-06 (×33): qty 1

## 2022-05-06 MED ORDER — LACOSAMIDE 50 MG PO TABS
200.0000 mg | ORAL_TABLET | Freq: Two times a day (BID) | ORAL | Status: DC
  Administered 2022-05-06 – 2022-06-10 (×71): 200 mg via ORAL
  Filled 2022-05-06 (×74): qty 4

## 2022-05-06 NOTE — Progress Notes (Signed)
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 s/p tumor resection 7/3. Recent DC from [facility] 6/22. MRI on 7/26 shows: SDH extending inferiorly along the clivus and  narrowing the foramen magnum with possible mass effect. PMH: CKD stage 3, HFpEF, a fib, meningioma.    PT Comments    Patient progressing slowly.  Able to answer "no" when questioned about pain.  She was a little more lethargic today with less eye opening than last session.  Able to work to get arms extended on pillows after stretching and tone inhibition techniques.  Sister, Estill Bamberg, present and assisting with her care with NT for bathing and with PT during transfer to chair.  Patient remains appropriate for acute inpatient rehab with goal for decreased burden of care and caregiver education.     Recommendations for follow up therapy are one component of a multi-disciplinary discharge planning process, led by the attending physician.  Recommendations may be updated based on patient status, additional functional criteria and insurance authorization.  Follow Up Recommendations  Acute inpatient rehab (3hours/day)     Assistance Recommended at Discharge Frequent or constant Supervision/Assistance  Patient can return home with the following Two people to help with walking and/or transfers;Two people to help with bathing/dressing/bathroom;Assist for transportation;Assistance with feeding;Assistance with Education officer, environmental (measurements PT);Wheelchair cushion (measurements PT);Hospital bed;Other (comment);BSC/3in1    Recommendations for Other Services       Precautions / Restrictions Precautions Precautions: Fall Precaution Comments: seizures     Mobility  Bed Mobility Overal bed mobility: Needs Assistance Bed  Mobility: Supine to Sit       Sit to supine: Max assist, HOB elevated, +2 for safety/equipment   General bed mobility comments: arms held in to chest and pt slow to respond so assisted up to EOB    Transfers Overall transfer level: Needs assistance   Transfers: Bed to chair/wheelchair/BSC   Stand pivot transfers: Max assist   Squat pivot transfers: Max assist, +2 safety/equipment     General transfer comment: NT in the room and assisting with hygiene initially; then assisted to hold the chair, pt's sister at bedside and attempting to also assist pt with transfer to chair.  PT lifted pt up to her feet then sister helped to pivot hips.    Ambulation/Gait                   Stairs             Wheelchair Mobility    Modified Rankin (Stroke Patients Only) Modified Rankin (Stroke Patients Only) Pre-Morbid Rankin Score: Moderately severe disability Modified Rankin: Severe disability     Balance Overall balance assessment: Needs assistance Sitting-balance support: Feet supported Sitting balance-Leahy Scale: Poor Sitting balance - Comments: posterior bias throughout, cues and assist for chin tuck to encourage anterior weight shift in sitting. Postural control: Posterior lean   Standing balance-Leahy Scale: Zero Standing balance comment: max support for standing                            Cognition Arousal/Alertness: Lethargic Behavior During Therapy: Flat affect Overall Cognitive Status: Impaired/Different from baseline Area of Impairment: Attention, Following commands, Safety/judgement, Problem solving  Current Attention Level: Focused   Following Commands: Follows one step commands inconsistently, Follows one step commands with increased time     Problem Solving: Slow processing, Decreased initiation, Difficulty sequencing, Requires verbal cues, Requires tactile cues General Comments: verbalizing "no" in response to  if in pain        Exercises Other Exercises Other Exercises: PROM bilateral UE's with tone inhibition techniques Other Exercises: PROM cervical Other Exercises: ankle PF stretch in sitting    General Comments General comments (skin integrity, edema, etc.): VSS on 3L O2 drinking nectar thick apple juice with assist      Pertinent Vitals/Pain Pain Assessment Faces Pain Scale: No hurt    Home Living                          Prior Function            PT Goals (current goals can now be found in the care plan section) Progress towards PT goals: Progressing toward goals    Frequency    Min 3X/week      PT Plan Current plan remains appropriate    Co-evaluation              AM-PAC PT "6 Clicks" Mobility   Outcome Measure  Help needed turning from your back to your side while in a flat bed without using bedrails?: Total Help needed moving from lying on your back to sitting on the side of a flat bed without using bedrails?: Total Help needed moving to and from a bed to a chair (including a wheelchair)?: Total Help needed standing up from a chair using your arms (e.g., wheelchair or bedside chair)?: Total Help needed to walk in hospital room?: Total Help needed climbing 3-5 steps with a railing? : Total 6 Click Score: 6    End of Session Equipment Utilized During Treatment: Gait belt;Oxygen Activity Tolerance: Patient tolerated treatment well Patient left: in chair;with call bell/phone within reach;with chair alarm set Nurse Communication: Mobility status PT Visit Diagnosis: Other abnormalities of gait and mobility (R26.89);Other symptoms and signs involving the nervous system (R29.898)     Time: 3016-0109 PT Time Calculation (min) (ACUTE ONLY): 38 min  Charges:  $Therapeutic Exercise: 8-22 mins $Therapeutic Activity: 8-22 mins                     Magda Kiel, PT Acute Rehabilitation Services Office:[phone] 05/06/2022    [name] 05/06/2022, 3:43 PM

## 2022-05-06 NOTE — Progress Notes (Signed)
Speech Language Pathology Treatment: Dysphagia;Cognitive-Linquistic  Patient Details Name: Tara [name] MRN: [id number] DOB: Jun 02, 1946 Today's Date: 05/06/2022 Time: 6754-4920 SLP Time Calculation (min) (ACUTE ONLY): 13 min  Assessment / Plan / Recommendation Clinical Impression  Pt seen for dysphagia and cognition with sister-in-law at bedside. Her eyes are open and responding verbally to this therapist this morning. Her attention and initiation is improving although continues to need extra time to process, cues for eye contact when eyes open. She is oriented to place but decreased intellectual awareness to her situation and impairments.  RN stated pt took meds crushed without difficulty. She needed mod-max assist for self feeding- rigid right UE. Upgraded po texture trial with graham cracker with mildly prolonged mastication however functional without residue. Pt has a lip smacking behavior that she has demonstrated since admission with po's and sometimes at rest and frequency is decreasing. No indications of compromised airway with honey thick via straw although therapist controlled for smaller sips and educated sister-in-law to pull straw away after approximately 3 sips. She is able to upgrade texture, however, given attention and initiation deficits recommend Dys 2 at this time. ST will continue to follow.    HPI HPI: Pt is a 76 y/o who presented 6/24 with new onset seizures. 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 EEG negative for seizures. Neurosurgey consulted and did not recommend meningioma resection as of 6/27. Decline in swallow function noted overnight 6/27. Dx acute toxic metabolic encephalopathy suspected secondary to keppra. Pt s/p Bifrontal craniotomy for resection of meningioma 7/3.  Cortrak 7/7. 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  CXR 8/6 concerning for patchy airspace disease. PMH: HTN, HLD, GERD, CKD stage IIIb, chronic HFpEF,  meningioma, breast CA-s/p right lumpectomy and chemoradiation in 2008, depression/anxiety. BSE 03/11/22: regular texture diet and thin liquids without need for follow up.      SLP Plan  Continue with current plan of care      Recommendations for follow up therapy are one component of a multi-disciplinary discharge planning process, led by the attending physician.  Recommendations may be updated based on patient status, additional functional criteria and insurance authorization.    Recommendations  Diet recommendations: Dysphagia 2 (fine chop);Honey-thick liquid Liquids provided via: Cup;Straw Medication Administration: Crushed with puree Supervision: Full supervision/cueing for compensatory strategies;Staff to assist with self feeding Compensations: Slow rate;Small sips/bites;Multiple dry swallows after each bite/sip;Clear throat intermittently Postural Changes and/or Swallow Maneuvers: Seated upright 90 degrees                General recommendations: Rehab consult Oral Care Recommendations: Oral care BID Follow Up Recommendations: Acute inpatient rehab (3hours/day) Assistance recommended at discharge: Frequent or constant Supervision/Assistance SLP Visit Diagnosis: Dysphagia, unspecified (R13.10);Cognitive communication deficit (F00.712) Plan: Continue with current plan of care           Houston Siren  05/06/2022, 9:20 AM

## 2022-05-06 NOTE — TOC Progression Note (Signed)
Transition of Care [facility]) - Progression Note    Patient Details  Name: Tara [name] MRN: [id number] Date of Birth: 05/15/1946  Transition of Care [facility]) CM/SW Scott, RN Phone Number:[phone]  05/06/2022, 12:54 PM  Clinical Narrative:    Urban Gibson with CIR made aware of request to screen patient for CIR.    Expected Discharge Plan: [place] Barriers to Discharge: Continued Medical Work up, Ship broker  Expected Discharge Plan and Services Expected Discharge Plan: [place]   Discharge Planning Services: CM Consult   Living arrangements for the past 2 months: [place]: [place]         Social Determinants of Health (SDOH) Interventions    Readmission Risk Interventions    03/06/2022   12:36 PM  Readmission Risk Prevention Plan  Transportation Screening Complete  HRI or Home Care Consult Complete  Social Work Consult for [place] Planning/Counseling Complete

## 2022-05-06 NOTE — Progress Notes (Signed)
Inpatient Rehab Admissions Coordinator:   Per MD request pt was rescreened for CIR by Shann Medal, PT, DPT.  Reviewed therapy notes from admission.  Pt has not made significant progress with mobility throughout hospitalization (continues to require 2 person max to total assist for EOB/transfers).  In order to qualify for CIR, pt's need to demonstrate the ability to make significant functional gains in a short period of time.  Would not consider for admit to our rehab program.  TOC aware.   Shann Medal, PT, DPT Admissions Coordinator [phone] 05/06/22  1:03 PM

## 2022-05-06 NOTE — Consult Note (Signed)
[facility] Consult: 3:00 PM 05/06/2022  LOS: 52 days    Referring Provider: Dr Marlowe Sax.    Primary Care Physician:  [name], MD in [place]. Primary Gastroenterologist:  Dr. Lavinia Sharps.  [facility].    Reason for Consultation: Ileus, possible sigmoid volvulus   HPI: SUKI CROCKETT is a 76 y.o. female.  PMH breast cancer 2008.  Vitamin D deficiency.  HLD.  Obesity.  Anxiety/depression GERD.  Sphenoidal meningioma.   2008 colonoscopy "benign polyps, next colonoscopy 12/2011" unable to find actual colonoscopy report or a pathology report. 04/2014 colonoscopy.  For personal history of colon polyps and family history colon cancer.  7 polyps were removed.  Diverticulosis.  Nonbleeding internal hemorrhoids.  Path: TAs and HP polyps.  05/2016 EGD abnormal distal esophagus, stomach and duodenum.  Esophagus described as tubular, edematous.  Stomach described as boggy, edematous with poor insufflation.  NG tube trauma at greater curvature and NG tube removed for procedure.  Fixed, narrowed pyloric channel.  Required moderate pressure to pass scope beyond and findings of duodenal edema.  Stomach and duodenal biopsies obtained.  Question was if this was angioedema versus submucosal process such as lymphoma.  Findings not consistent with ischemia.  Path: peptic duodenitis.  Reactive gastropathy.  No H. pylori.  No intestinal metaplasia, dysplasia, malignancy  Patient is close to 2 months for this current hospital stay.  Presented with new onset seizures.  03/24/2020 craniotomy, meningioma resection.  Postop subdural hematoma, then later had mass effect from SDH and subacute/acute scattered small infarcts.  LVEF is 40 to 03% with diastolic function, significant right ventricular failure. Treated with Unasyn  for sepsis, asp PNA  Anemia was evaluated with 04/13/2022 CTAP w contrast:  No active hemorrhage.  Patchy ATX versus infiltrate at lung bases.  Paraesophageal HH.  Renal cyst.  Aortic atherosclerosis.  Degenerative spine disease. Required tube feeds for dysphagia and aspiration events.  SLP cleared her for D1, honey thick liquid diet on 8 9.  Core track feeding tube was removed yesterday. A-fib, RVR started on Eliquis around 6/12 .Marland Kitchen  Discontinued ... resumed on 7/28... Discontinued today.  Yesterday developed painless abdominal distention and imaging with KUB and CT today. KUB this morning showed moderate to marked sigmoid distention may be ileus or sigmoid volvulus.  Recommend CT if suspicion for volvulus.  No significant SB dilatation.  Moderate to large amount of stool in colon.  No abdominal pain.  Before she went down for her KUB she had a couple of soft, light brown stools of moderate to large volume.  She has been on a bowel regimen and having bowel movements about every day.  Sister tells me that at home she tends to be bothered by constipation.  There is been no nausea or vomiting.  She has now had a third bowel movement for the day, also soft and light brown. Repeat CTAP w contrast is pending.  However IV team needs to come and place an IV line so this study can be performed.  Potassium normal. Hgb 8.9.  MCV 87.  Normal platelets.  WBCs 20 K.  Low iron, low TIBC, low iron sats.  Ferritin 440.  Normal ferritin and B12.   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   Anxiety    Breast cancer ([place]) 2008   right - s/p lumpectomy->chemo, radiation   Depression    Dysrhythmia    hx SVT   Family history of colon cancer    Family history of prostate cancer    GERD (gastroesophageal reflux disease)    H/O: hysterectomy    Hiatal hernia    History of cancer chemotherapy    History of radiation therapy     Hyperlipidemia    Hypertension 01/25/2018   Nonischemic cardiomyopathy ([place])    Personal history of radiation therapy 01/05/209   SVT (supraventricular tachycardia) ([place])    short RP SVT documented 1/44   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[place])    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)   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    Past Surgical History:  Procedure Laterality Date   ABDOMINAL HYSTERECTOMY  1994   fibroids,    BIOPSY  06/19/2016   Procedure: BIOPSY;  Surgeon: Daneil Dolin, MD;  [facility];  Service: Endoscopy;;  gastric duodenum   BREAST BIOPSY Right 12/16/2006   malignant   BREAST EXCISIONAL BIOPSY Right 2018   benign lumpectomy   BREAST LUMPECTOMY Right 02/2007   BREAST LUMPECTOMY WITH RADIOACTIVE SEED LOCALIZATION Right 07/29/2017   Procedure: RIGHT BREAST LUMPECTOMY WITH RADIOACTIVE SEED LOCALIZATION;  Surgeon: Fanny Skates, MD;  Location: [place];  Service: General;  Laterality: Right;   BREAST SURGERY Right 2008   lumpectomy, cancer   CARDIAC CATHETERIZATION     CHOLECYSTECTOMY  1999   COLONOSCOPY  2008   Dr. Oneida Alar: multiple polyps. Path not available at time of visit.    COLONOSCOPY WITH PROPOFOL N/A 04/25/2014   Dr. Oneida Alar: Multiple tubular adenomas removed. Diverticulosis. Moderate internal hemorrhoids. Next colonoscopy planned for August 2018.   CRANIOTOMY Bilateral 03/24/2022   Procedure: Bifrontal craniotomy for resection of meningioma;  Surgeon: Ashok Pall, MD;  Location: [place];  Service: Neurosurgery;  Laterality: Bilateral;   ESOPHAGOGASTRODUODENOSCOPY (EGD) WITH PROPOFOL N/A 06/19/2016   Procedure: ESOPHAGOGASTRODUODENOSCOPY (EGD) WITH PROPOFOL;  Surgeon: Daneil Dolin, MD;  [facility];  Service: Endoscopy;  Laterality: N/A;   MASTECTOMY W/ SENTINEL NODE BIOPSY Right 06/09/2018   Procedure: RIGHT TOTAL MASTECTOMY WITH SENTINEL LYMPH NODE BIOPSY;  Surgeon: Fanny Skates, MD;   Location: [place];  Service: General;  Laterality: Right;   POLYPECTOMY N/A 04/25/2014   Procedure: POLYPECTOMY;  Surgeon: Danie Binder, MD;  Location: [facility];  Service: Endoscopy;  Laterality: N/A;  Ascending and Decending Colon x3 , Transverse colon x2, rectal   PORTACATH PLACEMENT N/A 06/09/2018   Procedure: INSERTION PORT-A-CATH;  Surgeon: Fanny Skates, MD;  Location: [place];  Service: General;  Laterality: N/A;   RIGHT/LEFT HEART CATH AND CORONARY ANGIOGRAPHY N/A 07/10/2017   Procedure: RIGHT/LEFT HEART CATH AND CORONARY ANGIOGRAPHY;  Surgeon: Larey Dresser, MD;  Location: [place] [facility];  Service: Cardiovascular;  Laterality: N/A;    Prior to Admission medications   Medication Sig Start Date End Date Taking? Authorizing Provider  acetaminophen (TYLENOL) 500 MG tablet Take 500-1,000 mg by mouth every 6 (six) hours as  needed (FOR PAIN.).   Yes [provider]  amiodarone (PACERONE) 200 MG tablet Take 1 tablet (200 mg total) by mouth daily. 03/13/22  Yes Thurnell Lose, MD  apixaban (ELIQUIS) 2.5 MG TABS tablet Take 1 tablet (2.5 mg total) by mouth 2 (two) times daily. 03/12/22  Yes Thurnell Lose, MD  azelastine (ASTELIN) 0.1 % nasal spray Place 1 spray into both nostrils daily as needed for rhinitis. Use in each nostril as directed   Yes [provider]  Cholecalciferol (VITAMIN D) 50 MCG (2000 UT) CAPS Take 2,000 Units by mouth daily.   Yes [provider]  clonazePAM (KLONOPIN) 0.5 MG tablet Take 1 tablet (0.5 mg total) by mouth at bedtime. 01/20/22  Yes [name], MD  dexamethasone (DECADRON) 0.5 MG tablet Take 2 tabs for 3 days, then 1 tab for 3 days then stop. 03/13/22  Yes Thurnell Lose, MD  docusate sodium (COLACE) 100 MG capsule Take 200 mg by mouth 2 (two) times daily as needed for mild constipation.   Yes [provider]  JARDIANCE 10 MG TABS tablet TAKE ONE TABLET BY MOUTH ONCE DAILY. Patient taking differently: Take 10 mg  by mouth daily. 01/03/22  Yes Larey Dresser, MD  montelukast (SINGULAIR) 10 MG tablet TAKE ONE TABLET BY MOUTH AT BEDTIME. Patient taking differently: Take 10 mg by mouth at bedtime. 09/03/21  Yes [name], MD  pantoprazole (PROTONIX) 40 MG tablet TAKE (1) TABLET BY MOUTH TWICE DAILY. Patient taking differently: 40 mg 2 (two) times daily. 11/18/21  Yes [name], MD  Phenylephrine-Witch Hazel (PREPARATION H) 0.25-50 % GEL Apply 1 Application topically daily as needed (hemorrhoid).   Yes [provider]  rosuvastatin (CRESTOR) 20 MG tablet Take 1 tablet (20 mg total) by mouth daily. 01/14/22  Yes [name], MD  sertraline (ZOLOFT) 25 MG tablet Take 1 tablet (25 mg total) by mouth daily. 01/20/22  Yes [name], MD    Scheduled Meds:  amiodarone  200 mg Oral Daily   apixaban  5 mg Oral BID   budesonide (PULMICORT) nebulizer solution  0.25 mg Nebulization BID   Chlorhexidine Gluconate Cloth  6 each Topical Daily   [START ON 05/08/2022] furosemide  40 mg Oral QODAY   lacosamide  200 mg Oral BID   levETIRAcetam  1,000 mg Oral BID   midodrine  5 mg Oral BID WC   montelukast  10 mg Oral QHS   multivitamin with minerals  1 tablet Oral Daily   mouth rinse  15 mL Mouth Rinse 4 times per day   pantoprazole  40 mg Oral BID   rosuvastatin  20 mg Oral Daily   senna-docusate  1 tablet Oral BID   Infusions:  PRN Meds: acetaminophen **OR** acetaminophen, antiseptic oral rinse, bisacodyl, guaiFENesin, HYDROcodone-acetaminophen, ipratropium-albuterol, labetalol, LORazepam, ondansetron **OR** ondansetron (ZOFRAN) IV, mouth rinse, polyethylene glycol, sodium phosphate   Allergies as of 03/15/2022 - Review Complete 03/15/2022  Allergen Reaction Noted   Aspirin Other (See Comments) 01/31/2014   Lisinopril Cough 04/23/2016   Sudafed [pseudoephedrine hcl]  03/16/2016    Family History  Problem Relation Age of Onset   Colon cancer Father 22       died at  age 95   Hypertension Sister    Heart disease Sister    Cancer Sister        unknown form   Dementia Mother    Stroke Mother 64  left hemiparesis   Cancer Brother 110       prostate   Cancer Paternal Aunt        NOS   Lung cancer Paternal Uncle    Other Paternal Uncle        lightning strike   Other Paternal Uncle        hit by train   Other Paternal 79        old age   62 Cousin        NOS pat first cousin   Cancer Cousin        NOS pat first cousin   Prostate cancer Cousin        pat first cousin    Social History   Socioeconomic History   Marital status: Married    Spouse name: Not on file   Number of children: 2   Years of education: Not on file   Highest education level: Not on file  Occupational History   Occupation: disabled     Employer: UNEMPLOYED  Tobacco Use   Smoking status: Never   Smokeless tobacco: Never  Vaping Use   Vaping Use: Never used  Substance and Sexual Activity   Alcohol use: No   Drug use: No   Sexual activity: Not on file  Other Topics Concern   Not on file  Social History Narrative   Lives in [place] with family.  Does not routinely exercise.   Social Determinants of Health   Financial Resource Strain: Low Risk  (05/01/2021)   Overall Financial Resource Strain (CARDIA)    Difficulty of Paying Living Expenses: Not hard at all  Food Insecurity: No Food Insecurity (05/01/2021)   Hunger Vital Sign    Worried About Running Out of Food in the Last Year: Never true    Ran Out of Food in the Last Year: Never true  Transportation Needs: No Transportation Needs (05/01/2021)   PRAPARE - Hydrologist (Medical): No    Lack of Transportation (Non-Medical): No  Physical Activity: Insufficiently Active (05/01/2021)   Exercise Vital Sign    Days of Exercise per Week: 3 days    Minutes of Exercise per Session: 30 min  Stress: No Stress Concern Present (05/01/2021)   Brenda    Feeling of Stress : Not at all  Social Connections: Moderately Integrated (05/01/2021)   Social Connection and Isolation Panel [NHANES]    Frequency of Communication with Friends and Family: More than three times a week    Frequency of Social Gatherings with Friends and Family: More than three times a week    Attends Religious Services: More than 4 times per year    Active Member of [employer] or Organizations: No    Attends Archivist Meetings: Never    Marital Status: Married  Human resources officer Violence: Not At Risk (05/01/2021)   Humiliation, Afraid, Rape, and Kick questionnaire    Fear of Current or Ex-Partner: No    Emotionally Abused: No    Physically Abused: No    Sexually Abused: No    REVIEW OF SYSTEMS: Patient is quite somnolent.  Her mental status waxes and wanes.  Earlier today she was more alert and when she is alert she is not confused.  However now she is quite somnolent and not maintaining arousal for very long so I did not complete a review of systems.  Did ask her simple questions about  the GI track noted in the HPI.   PHYSICAL EXAM: Vital signs in last 24 hours: Vitals:   05/06/22 0806 05/06/22 1134  BP:  121/75  Pulse:  100  Resp:  20  Temp:  98 F (36.7 C)  SpO2: 96% 96%   Wt Readings from Last 3 Encounters:  05/06/22 74.7 kg  03/13/22 85 kg  01/14/22 78.8 kg    General: Patient is obese.  Looks unwell, pale.  Resting comfortably on her left side. Head: Long scar across front of her scalp is clean, dry, intact Ears: No obvious hearing deficit Nose: No congestion or discharge Mouth: No discharge.  Mucosa is moist and clear. Neck: No JVD, no masses Lungs: Lear bilaterally.  No labored breathing.  No cough. Heart: RRR. Abdomen: Obese but soft.  Moderately distended.  Not tender.  Active bowel sounds without high-pitched or tympanitic quality.  No HSM, masses, bruits, hernias..   Rectal: Incontinent of  loose/soft light brown stool.  DRE with same material on exam glove.  No masses.  No bleeding. Musc/Skeltl: No joint redness, swelling or gross deformities. Extremities: Lower extremity edema Neurologic: Follows commands.  Answers simple questions appropriately. Skin: No obvious sores, rashes or suspicious lesions   Intake/Output from previous day: 08/14 0701 - 08/15 0700 In: 1600 [P.O.:1000; NG/GT:600] Out: 1000 [Urine:1000] Intake/Output this shift: No intake/output data recorded.  LAB RESULTS: Recent Labs    05/06/22 0551  WBC 20.0*  HGB 8.9*  HCT 28.8*  PLT 250   BMET Lab Results  Component Value Date   NA 141 05/06/2022   NA 139 05/03/2022   NA 136 04/30/2022   K 3.7 05/06/2022   K 4.5 05/03/2022   K 4.1 04/30/2022   CL 103 05/06/2022   CL 103 05/03/2022   CL 98 04/30/2022   CO2 26 05/06/2022   CO2 27 05/03/2022   CO2 27 04/30/2022   GLUCOSE 125 (H) 05/06/2022   GLUCOSE 153 (H) 05/03/2022   GLUCOSE 155 (H) 04/30/2022   BUN 40 (H) 05/06/2022   BUN 33 (H) 05/03/2022   BUN 23 04/30/2022   CREATININE 1.06 (H) 05/06/2022   CREATININE 0.91 05/03/2022   CREATININE 0.79 04/30/2022   CALCIUM 9.2 05/06/2022   CALCIUM 8.9 05/03/2022   CALCIUM 8.8 (L) 04/30/2022   LFT No results for input(s): "PROT", "ALBUMIN", "AST", "ALT", "ALKPHOS", "BILITOT", "BILIDIR", "IBILI" in the last 72 hours. PT/INR Lab Results  Component Value Date   INR 1.1 02/27/2022   INR 1.00 07/24/2017   INR 1.02 07/10/2017   Hepatitis Panel No results for input(s): "HEPBSAG", "HCVAB", "HEPAIGM", "HEPBIGM" in the last 72 hours. C-Diff No components found for: "CDIFF" Lipase     Component Value Date/Time   LIPASE 67 (H) 02/27/2022 2156    Drugs of Abuse  No results found for: "LABOPIA", "COCAINSCRNUR", "LABBENZ", "AMPHETMU", "THCU", "LABBARB"   RADIOLOGY STUDIES: DG Abd Portable 1V  Result Date: 05/06/2022 CLINICAL DATA:  Abdominal distention EXAM: PORTABLE ABDOMEN - 1 VIEW  COMPARISON:  03/21/2022 FINDINGS: There is moderate to marked gaseous distention of sigmoid colon. Moderate to large amount of stool is seen in colon. There is no small bowel dilation. Stomach is not distended. IMPRESSION: There is moderate to marked gaseous distention of sigmoid colon. This may be due to ileus or suggest sigmoid volvulus. If there is clinical suspicion for sigmoid volvulus, follow-up CT should be considered. There is no significant small bowel dilation. Moderate to large amount of stool is seen in colon. Electronically  Signed   By: Elmer Picker M.D.   On: 05/06/2022 12:23      IMPRESSION:     Ileus vs sigmoid volvulus.  Patient has already had 3 fairly large soft stools today.  There is no abdominal pain, no obstructive symptoms.  Seizures.  Status post resection of meningioma with postop complications.  Dysphagia, aspiration, aspiration pneumonia.  Cleared for dysphagia diet by SLP.  Tolerating this diet yesterday and today but now n.p.o.  Eliquis for Afib, RVR.  Held as of today.  Last dose was yesterday evening on 8/15    Adenomatous and HP colon polyps.  Colonoscopy 2015    PLAN:     CT scan pending.   Azucena Freed  05/06/2022, 3:00 PM Phone [phone]

## 2022-05-06 NOTE — Progress Notes (Signed)
Pt has order for STAT abd CT. Per CT new IV required d/t being older than 3 days. STAT IV consult placed d/t pt being difficult IV start and her R arm is restricted.

## 2022-05-06 NOTE — Progress Notes (Signed)
Paged Dr. Posey Pronto to review pt abd xray. New orders given for NPO and CT of abd.

## 2022-05-06 NOTE — Progress Notes (Signed)
[facility] Consultation Progress Note  Patient: Tara [name] ZOX:[id number]   PCP: [name], MD DOB: 25-Apr-1946   DOA: 03/15/2022   DOS: 05/06/2022   Date of Service: the patient was seen and examined on 05/06/2022 Primary service: Ashok Pall, MD   Brief hospital course: 76 year old African-American female PMH of anxiety, depression, type II DM, HLD, PAF, on Eliquis, CKD 3B, breast cancer SP chemoradiation presented to hospital on 6/24 with new onset seizures. Known history of sphenoidal meningioma that was noted to be increased in size on the CT scan with surrounding edema so she is transferred from [facility] for further evaluation.  She underwent LTM with no further seizures and was evaluated by neurosurgery and underwent bifrontal craniotomy for meningioma resection on 03/24/2020 and was transferred to the intensive care unit postoperatively with PCCM consulting while she was in ICU. 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  Treated with Unasyn for 5 days. 7/11 recurrent sz like activity. Not correlated on EEG. CT unchanged.  7/13 neurology signed off. 7/19 started on ABX for sepsis then transition to Unasyn. 7/21 palliative care was consulted. 7/23 CT chest and abdomen was performed negative for PE or significant pneumonia, without any hemorrhage 7/25 MRI brain without contrast shows increasing mass effect, SDH as well as subacute/acute small scattered infarct. 7/26 tube feeds resumed, neurology reconsulted.  Eliquis resumed per neurosurgery 7/27 oxygenation improving, mentation improving, neurology following.  CTA head and neck without any LVO.  Lower extremity Doppler without any DVT.  Echo shows new systolic  dysfunction with global hypokinesis EF 40 to 45% and significant RV failure. 7/30 had another aspiration event. Tube feeds on hold. Now on NRB. Remains full code. No tracheostomy per husband. Per Neurosurgery, give at least two weeks if not more to see if she improves.  8/1 oxygenation improving again to 5 L.  Trickle tube feeds started. 8/9 on D1 honey thick liquid diet.  Calorie count initiated. 8/13 oxygenation improving to 2 L. 8/14 core track removed nocturnal tube feeds stopped. 8/15 leukocytosis worsening, abdomen distended, x-ray abdomen shows evidence of possible sigmoid volvulus, GI consulted, CT abdomen negative for any volvulus and only shows redundant sigmoid colon.  Will resume diet.   Assessment and Plan: Acute issues. Sigmoid volvulus versus ileus. Leukocytosis worsening as well as abdominal distention worsening. X-ray abdomen shows evidence of sigmoid volvulus. CT abdomen reassuring. We will resume diet. GI consulted will follow recommendation.  Hypoxia. Still on 5 L of oxygen. Continue to monitor.  Acute confusional state. Secondary to meningioma and seizures. Gradually improving. Continue to monitor and continue supportive care.  Continue therapy.  Leukocytosis. Etiology not clear. Currently worsening. Monitor  Dysphagia Recurrent aspiration Dietitian following speech therapy following.  On tube feeding with core track. Calorie count was completed and per dietitian patient is taking adequate calories. Core track discontinued 8/14.  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 transferred to [facility]. Neurosurgery was consulted. S/P bifrontal craniotomy for meningioma resection on 03/24/2022 Patient was started on Keppra and Vimpat.  Neurology was consulted. Underwent  multiple EEG as well as LTM.  Last EEG 7/28 negative for any acute seizures. MRI 7/25 shows evidence of SDH and SAH.  Which was present on prior CT as well Apixaban resumed on 7/28 Mentation gradually improving. Management per neurosurgery.   Sepsis in setting of HCAP and Recurrent Aspiration pneumonia  Acute hypoxic respiratory failure Febrile on 7/23 on 100.9 BC x2 NGTD, repeat BC X 2 NGTD  SARS COVID-negative UA unremarkable for infection, UC growing multiple species Chest x-ray showed "New hazy opacities projecting over both upper lobes in the suprahilar regions could reflect developing infection in the correct clinical setting CT chest, abdomen/pelvis, showed no PE, multifocal groundglass opacity likely pulmonary edema. CT abdomen pelvis with no evidence of hemorrhage PCCM was consulted and pt was switched to IV Unasyn. Continue DuoNebs, nebulizers Continue supplemental oxygen as needed Pulmonary toilet with Chest Vest and NT suction. On 7/30 worsening hypoxia and needed NRB.  Most likely another aspiration event. Continue PRN IV lasix.  Currently on oral Lasix. Completed 14-day antibiotic course with last day on 8/4. Oxygenation improving back to 5 L on 8/10.   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 Currently appears to have severe exacerbation of heart failure. Repeat echocardiogram on 04/17/2022 shows EF dropping to 40 to 45% with global hypokinesis.  RV function also moderately low. CT PE protocol as well as lower extremity Doppler have ruled out VTE/PE Currently on oral Lasix.  Change to every other day. Continue midodrine support for hypotension Soft blood pressure not allowing initiation of GDMT.   RV dysfunction. PE ruled out.  Possibly due to pneumonia. Discussed with pulmonary. Patient will require repeat  echocardiogram in 6 months.   Acute embolic CVA-incidental Patient has history of A-fib. MRI brain shows evidence of restricted diffusion and left cerebellum, bilateral occipital lobe, left parietal lobe, bilateral frontal lobe, concerning for small scattered acute and subacute infarct most likely embolic in the setting of A-fib. Anticoagulation is currently resumed. Echocardiogram shows mildly worsening EF.   Currently on Crestor.  LDL 106 in April 2023.   Neurology signed off again.   Postoperative acute blood loss anemia-expected. Iron deficiency anemia Acute on chronic thrombocytopenia Hemoglobin dropped to 6.5 on 7/22 Transfused 2 units of PRBC on 7/22 Iron 15, sats 8, ferritin 440, vitamin B12-->356 Continue monitor for signs and symptoms bleeding; no overt bleeding noted Platelet counts now improving. Continue iron support.  [place] discussion Due to multiple comorbidities, minimally responsive state, overall poor prognosis Palliative care consulted for further goals of care discussion.  As of 7/30 per neurosurgery, "Would certainly give at least two weeks if not more to see if she improves." Husband does not want the patient to go to tracheostomy.   Hypokalemia Replace as needed   Hypernatremia. In the setting of diuresis. Treated with free water.  Resolved with   Anxiety and Depression Patient has been treated with sertraline throughout the hospital stay.  We will hold it to see if that helps improving the mentation.   HTN now hypotension   Paroxysmal Atrial Fibrillation Hyperlipidemia Continue Amiodarone 200 mg p.o. daily, and Rosuvastatin 20 mg   Type 2 DM controlled with hyperlipidemia and peripheral vascular disease Continue SSI, Accu-Cheks, hypoglycemic protocol Hemoglobin A1c 6.3 in 02/27/2022.   GERD/GI prophylaxis Continue pantoprazole 40 mg   Obesity Body mass index is 35.64 kg/m.  Placing the patient at high risk of poor outcome.   She  may also have  undiagnosed sleep apnea.   Pressure ulcer left buttock stage II not present on admission. Continue wound care foam dressing and frequent turning.  We will continue to follow the patient.    Subjective: Patient remains interactive.  No nausea no vomiting.  Patient actually having bowel movement.  But abdomen is significantly distended.  Leukocytosis worsening as well.  No fever no chills.  No increasing cough.  Objective: Vitals:   05/06/22 0806 05/06/22 1134 05/06/22 1502 05/06/22 2015  BP:  121/75 111/64   Pulse:  100 86   Resp:  20 17   Temp:  98 F (36.7 C) 98.3 F (36.8 C)   TempSrc:  Oral Oral   SpO2: 96% 96% 100% 100%  Weight:      Height:        General: Appear in mild distress; no visible Abnormal Neck Mass Or lumps, Conjunctiva normal Cardiovascular: S1 and S2 Present, no Murmur, Respiratory: good respiratory effort, Bilateral Air entry present and CTA, no Crackles, no wheezes Abdomen: Bowel Sound present, distended, tense, difficult to assess tenderness. Extremities: no Pedal edema Neurology: alert and not oriented, nonverbal.  Family Communication: Discussed with daughter on the phone and sister-in-law at bedside.  Data Reviewed: I have Reviewed nursing notes, Vitals, and Lab results since pt's last encounter. Pertinent lab results CBC and BMP I have ordered test including CBC and BMP I have ordered imaging studies x-ray abdomen and CT abdomen. I have discussed pt's care plan and test results with gastroenterology.   Author: Berle Mull, MD 05/06/2022 8:23 PM  To reach On-call, see care teams to locate the attending and reach out to them via [email or web address]. If 7PM-7AM, please contact night-coverage If you still have difficulty reaching the attending provider, please page the [facility] (Director on Call) for [facility] on amion for assistance.

## 2022-05-06 NOTE — Progress Notes (Signed)
Patient ID: [name], female   DOB: [birth year], 76 y.o.   MRN: [id number] BP 111/64 (BP Location: Right Leg)   Pulse 86   Temp 98.3 F (36.8 C) (Oral)   Resp 17   Ht '4\' 9"'$  (1.448 m)   Wt 74.7 kg   SpO2 100%   BMI 35.64 kg/m  Alert.  Going to CT for distended abdomen, and possible volvulus.  Moving all extremities Neurological status continuing to improve.

## 2022-05-07 DIAGNOSIS — D62 Acute posthemorrhagic anemia: Secondary | ICD-10-CM | POA: Diagnosis not present

## 2022-05-07 DIAGNOSIS — I5043 Acute on chronic combined systolic (congestive) and diastolic (congestive) heart failure: Secondary | ICD-10-CM | POA: Diagnosis not present

## 2022-05-07 DIAGNOSIS — J9601 Acute respiratory failure with hypoxia: Secondary | ICD-10-CM | POA: Diagnosis not present

## 2022-05-07 DIAGNOSIS — R935 Abnormal findings on diagnostic imaging of other abdominal regions, including retroperitoneum: Secondary | ICD-10-CM | POA: Diagnosis not present

## 2022-05-07 DIAGNOSIS — R1310 Dysphagia, unspecified: Secondary | ICD-10-CM

## 2022-05-07 DIAGNOSIS — R569 Unspecified convulsions: Secondary | ICD-10-CM | POA: Diagnosis not present

## 2022-05-07 DIAGNOSIS — I519 Heart disease, unspecified: Secondary | ICD-10-CM

## 2022-05-07 DIAGNOSIS — E870 Hyperosmolality and hypernatremia: Secondary | ICD-10-CM

## 2022-05-07 LAB — CBC WITH DIFFERENTIAL/PLATELET
Abs Immature Granulocytes: 0.32 10*3/uL — ABNORMAL HIGH (ref 0.00–0.07)
Basophils Absolute: 0.1 10*3/uL (ref 0.0–0.1)
Basophils Relative: 1 %
Eosinophils Absolute: 1.1 10*3/uL — ABNORMAL HIGH (ref 0.0–0.5)
Eosinophils Relative: 6 %
HCT: 27.7 % — ABNORMAL LOW (ref 36.0–46.0)
Hemoglobin: 8.9 g/dL — ABNORMAL LOW (ref 12.0–15.0)
Immature Granulocytes: 2 %
Lymphocytes Relative: 10 %
Lymphs Abs: 1.8 10*3/uL (ref 0.7–4.0)
MCH: 27.5 pg (ref 26.0–34.0)
MCHC: 32.1 g/dL (ref 30.0–36.0)
MCV: 85.5 fL (ref 80.0–100.0)
Monocytes Absolute: 1.6 10*3/uL — ABNORMAL HIGH (ref 0.1–1.0)
Monocytes Relative: 9 %
Neutro Abs: 13.5 10*3/uL — ABNORMAL HIGH (ref 1.7–7.7)
Neutrophils Relative %: 72 %
Platelets: 237 10*3/uL (ref 150–400)
RBC: 3.24 MIL/uL — ABNORMAL LOW (ref 3.87–5.11)
RDW: 17 % — ABNORMAL HIGH (ref 11.5–15.5)
WBC: 18.4 10*3/uL — ABNORMAL HIGH (ref 4.0–10.5)
nRBC: 0 % (ref 0.0–0.2)

## 2022-05-07 LAB — RENAL FUNCTION PANEL
Albumin: 2.4 g/dL — ABNORMAL LOW (ref 3.5–5.0)
Anion gap: 11 (ref 5–15)
BUN: 32 mg/dL — ABNORMAL HIGH (ref 8–23)
CO2: 27 mmol/L (ref 22–32)
Calcium: 8.7 mg/dL — ABNORMAL LOW (ref 8.9–10.3)
Chloride: 100 mmol/L (ref 98–111)
Creatinine, Ser: 0.95 mg/dL (ref 0.44–1.00)
GFR, Estimated: >60 mL/min (ref >60)
Glucose, Bld: 114 mg/dL — ABNORMAL HIGH (ref 70–99)
Phosphorus: 5.1 mg/dL — ABNORMAL HIGH (ref 2.5–4.6)
Potassium: 3.5 mmol/L (ref 3.5–5.1)
Sodium: 138 mmol/L (ref 135–145)

## 2022-05-07 LAB — MAGNESIUM: Magnesium: 2 mg/dL (ref 1.7–2.4)

## 2022-05-07 NOTE — Progress Notes (Signed)
Patient ID: Tara [name], female   DOB: 12/16/1945, 76 y.o.   MRN: [id number] Alert, speaking non fluently , following commands Moving all extremities Improving each day CT abdomen is normal. Eating well today,

## 2022-05-07 NOTE — Assessment & Plan Note (Signed)
Resolved

## 2022-05-07 NOTE — Assessment & Plan Note (Signed)
Acute anemia down to 6.5 requiring 2 units of PRBC. Hemoglobin currently stable.

## 2022-05-07 NOTE — Progress Notes (Signed)
Occupational Therapy Treatment Patient Details Name: Tara [name] MRN: [id number] DOB: [birth year] Today's Date: 05/07/2022   History of present illness 76 yo woman admitted 03/15/22 with new onset seizures related to meningioma. s/p tumor resection 7/3. Recent DC from [facility] 6/22. MRI on 7/26 shows: SDH extending inferiorly along the clivus and  narrowing the foramen magnum with possible mass effect. PMH: CKD stage 3, HFpEF, a fib, meningioma.   OT comments  Pt making slow progress towards acute OT goals. Lethargy a limiting factor this session. Pt seen up in chair, family member present. Pt maintained eyes closed the entire session but able to rouse for a couple Y/N questions which she verbally responded to. Able to state "Zacarias Pontes" for place. Worked on BUE ROM in seated position, able to come out of flexion synergy pattern with use of facilitory techniques though returned into this position shortly after. Hand over hand total assist to complete familiar grooming tasks. D/c recommendation remains appropriate.   Recommendations for follow up therapy are one component of a multi-disciplinary discharge planning process, led by the attending physician.  Recommendations may be updated based on patient status, additional functional criteria and insurance authorization.    Follow Up Recommendations  Skilled nursing-short term rehab (<3 hours/day)    Assistance Recommended at Discharge Frequent or constant Supervision/Assistance  Patient can return home with the following  Two people to help with walking and/or transfers;Two people to help with bathing/dressing/bathroom;Assistance with cooking/housework;Assistance with feeding;Direct supervision/assist for medications management;Direct supervision/assist for financial management;Assist for transportation;Help with stairs or ramp for entrance   Equipment Recommendations  Other (comment) (defer to next venue)    Recommendations for Other Services       Precautions / Restrictions Precautions Precautions: Fall Precaution Comments: seizures Restrictions Weight Bearing Restrictions: No       Mobility Bed Mobility               General bed mobility comments: up in chair    Transfers                         Balance                                           ADL either performed or assessed with clinical judgement   ADL Overall ADL's : Needs assistance/impaired     Grooming: Oral care;Wash/dry face;Wash/dry hands;Total assistance;Sitting Grooming Details (indicate cue type and reason): hand over hand assist. Pt moving lip muscles during mouth washing and oral care.                               General ADL Comments: Pt seen in recliner. BUE PROM exercises. Lethargy limiting session. hand over hand to complete familiar grooming tasks.    Extremity/Trunk Assessment Upper Extremity Assessment Upper Extremity Assessment: RUE deficits/detail;LUE deficits/detail RUE Deficits / Details: PROM with lethargy a limiting factor. Able to range RUE joints into extension against flexion tone tendency with use of facilitory techniques.. RUE quick to return to flexion synergy pattern. Placed rolled washcloth in palm at end of session to guard palm and encourage digit extension RUE Coordination: decreased fine motor;decreased gross motor LUE Deficits / Details: PROM with lethargy a limiting factor. Able to range LUE joints into extension against flexion tone tendency with use  of facilitory techniques. LUE quick to return to flexion synergy pattern. LUE Coordination: decreased fine motor;decreased gross motor   Lower Extremity Assessment Lower Extremity Assessment: Defer to PT evaluation        Vision   Additional Comments: did not open eyes during session even when verbally answering yes/no questions   Perception     Praxis      Cognition Arousal/Alertness: Lethargic Behavior During  Therapy: Flat affect Overall Cognitive Status: Difficult to assess                                 General Comments: verbalized "no" in response to pain and if she is cold. Stated "Zacarias Pontes" when asked place. Repeated "Zacarias Pontes" when asked to identify family in the room. Eyes closed for entirety of session.        Exercises Exercises: Other exercises General Exercises - Upper Extremity Shoulder Flexion: Both, 5 reps, Seated, PROM Shoulder Extension: PROM, 5 reps, Both, Seated Shoulder ABduction: PROM, Both, Seated, 5 reps Shoulder ADduction: PROM, 5 reps, Both, Seated Elbow Flexion: PROM, Both, 5 reps, Seated Elbow Extension: PROM, Both, 5 reps, Seated Wrist Flexion: PROM, Both, 5 reps, Seated Wrist Extension: PROM, Both, 5 reps, Seated Digit Composite Flexion: PROM, Both, 5 reps, Seated Composite Extension: PROM, Both, 5 reps, Seated    Shoulder Instructions       General Comments      Pertinent Vitals/ Pain       Pain Assessment Pain Assessment: No/denies pain  Home Living                                          Prior Functioning/Environment              Frequency  Min 2X/week        Progress Toward Goals  OT Goals(current goals can now be found in the care plan section)  Progress towards OT goals: Progressing toward goals  Acute Rehab OT Goals Patient Stated Goal: unable to state OT Goal Formulation: Patient unable to participate in goal setting Time For Goal Achievement: 05/16/22 Potential to Achieve Goals: Fair ADL Goals Pt Will Perform Grooming: with max assist;sitting Pt Will Perform Upper Body Bathing: with max assist;sitting Pt Will Perform Lower Body Bathing: with max assist;bed level Pt Will Perform Lower Body Dressing: with modified independence;sitting/lateral leans;sit to/from stand Pt Will Transfer to Toilet: with max assist;with +2 assist;bedside commode Pt Will Perform Toileting - Clothing  Manipulation and hygiene: with modified independence;sitting/lateral leans;sit to/from stand Additional ADL Goal #1: Pt will track to midline to find family members with moderate directional cues and max assist to turn head  Plan Discharge plan remains appropriate;Frequency remains appropriate    Co-evaluation                 AM-PAC OT "6 Clicks" Daily Activity     Outcome Measure   Help from another person eating meals?: Total Help from another person taking care of personal grooming?: Total Help from another person toileting, which includes using toliet, bedpan, or urinal?: Total Help from another person bathing (including washing, rinsing, drying)?: Total Help from another person to put on and taking off regular upper body clothing?: Total Help from another person to put on and taking off regular lower body clothing?: Total 6 Click Score:  6    End of Session Equipment Utilized During Treatment: Oxygen  OT Visit Diagnosis: Unsteadiness on feet (R26.81);Other abnormalities of gait and mobility (R26.89);Muscle weakness (generalized) (M62.81);Low vision, both eyes (H54.2);Other symptoms and signs involving cognitive function;Pain   Activity Tolerance Patient limited by lethargy   Patient Left in chair;with call bell/phone within reach;with chair alarm set;with family/visitor present   Nurse Communication          Time: 6728-9791 OT Time Calculation (min): 25 min  Charges: OT General Charges $OT Visit: 1 Visit OT Treatments $Self Care/Home Management : 23-37 mins  Tyrone Schimke, OT Acute Rehabilitation Services Office: [phone]   Hortencia Pilar 05/07/2022, 10:36 AM

## 2022-05-07 NOTE — Assessment & Plan Note (Signed)
Most recent Transthoracic Echocardiogram with LVEF of 40-45% (down from 55%) with global hypokinesis and associated RV dysfunction noted. Patient with acute heart failure this admission that responded to Lasix. Currently resolved. Not on GDMT secondary to recent hypotension. -Continue Lasix

## 2022-05-07 NOTE — Assessment & Plan Note (Signed)
Acute/chronic PE ruled out. Possibly related to pneumonia. Recommendation for repeat Transthoracic Echocardiogram in 6 months. Patient can follow-up with pulmonology.

## 2022-05-07 NOTE — Progress Notes (Signed)
Speech Language Pathology Treatment: Dysphagia;Cognitive-Linquistic  Patient Details Name: Tara [name] MRN: [id number] DOB: [birth year] Today's Date: 05/07/2022 Time: 4239-5320 SLP Time Calculation (min) (ACUTE ONLY): 8 min  Assessment / Plan / Recommendation Clinical Impression  Diet texture was upgraded to Dys 2 yesterday and sister-in-law stated she ate grits but did not want to eat the eggs or sausage. Today she is very sleepy and encountered up in chair. She required max verbal and tactile stimulation with wash cloth and trap squeeze. Attempted hand over hand face washing and teeth brushing. In previous sessions she keeps eyes closed but typically will interact less than 50% of the time and may follow commands. Today she continues to demonstrate decreased arousal and ability to attend/initiate tasks. Attempted solid texture however unable to initiate mastication. Continue Dys 2 texture, honey thick liquids and ST will continue to work with pt to progress communication, cognition and swallow.   HPI HPI: Pt is a 76 y/o who presented 6/24 with new onset seizures. 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 EEG negative for seizures. Neurosurgey consulted and did not recommend meningioma resection as of 6/27. Decline in swallow function noted overnight 6/27. Dx acute toxic metabolic encephalopathy suspected secondary to keppra. Pt s/p Bifrontal craniotomy for resection of meningioma 7/3.  Cortrak 7/7. 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  CXR 8/6 concerning for patchy airspace disease. PMH: HTN, HLD, GERD, CKD stage IIIb, chronic HFpEF, meningioma, breast CA-s/p right lumpectomy and chemoradiation in 2008, depression/anxiety. BSE 03/11/22: regular texture diet and thin liquids without need for follow up.      SLP Plan  Continue with current plan of care      Recommendations for follow up therapy are one component of a multi-disciplinary discharge  planning process, led by the attending physician.  Recommendations may be updated based on patient status, additional functional criteria and insurance authorization.    Recommendations  Diet recommendations: Dysphagia 2 (fine chop);Honey-thick liquid Liquids provided via: Cup;Straw Medication Administration: Crushed with puree Supervision: Full supervision/cueing for compensatory strategies;Staff to assist with self feeding Compensations: Slow rate;Small sips/bites;Multiple dry swallows after each bite/sip;Clear throat intermittently Postural Changes and/or Swallow Maneuvers: Seated upright 90 degrees                General recommendations: Rehab consult Oral Care Recommendations: Oral care BID Follow Up Recommendations: Acute inpatient rehab (3hours/day) Assistance recommended at discharge: Frequent or constant Supervision/Assistance SLP Visit Diagnosis: Dysphagia, unspecified (R13.10);Cognitive communication deficit (E33.435) Plan: Continue with current plan of care           Houston Siren  05/07/2022, 11:10 AM

## 2022-05-07 NOTE — Assessment & Plan Note (Addendum)
Secondary to pneumonia with associated tachypnea. Hypoxia improved but not resolved. Repeat chest x-ray with edema vs pneumonia. Patient with leukocytosis and no other infectious symptoms. Down to 1 L/min with Lasix. -Wean as able -repeat CXR with low lung volumes, no acute abnormality

## 2022-05-07 NOTE — Progress Notes (Addendum)
Daily Rounding Note  05/07/2022, 2:04 PM  LOS: 53 days   SUBJECTIVE:   Chief complaint:   sigmoid ileus  Sigmoid volvulus ruled out on yesterday evening's CT scan.  Large bowel movement, soft/liquid this morning.  Dysphagia diet restarted, she has had a little bit of lunch.    OBJECTIVE:         Vital signs in last 24 hours:    Temp:  [98.3 F (36.8 C)-98.7 F (37.1 C)] 98.7 F (37.1 C) (08/16 1158) Pulse Rate:  [83-86] 84 (08/16 1158) Resp:  [14-20] 20 (08/16 1158) BP: (102-126)/(55-71) 126/71 (08/16 1158) SpO2:  [93 %-100 %] 95 % (08/16 1158) FiO2 (%):  [100 %] 100 % (08/16 0818) Last BM Date : 05/06/22 Filed Weights   05/03/22 0345 05/04/22 0413 05/06/22 0500  Weight: 73.6 kg 74.7 kg 74.7 kg   General: Obese.  Looks ill.  Sitting up in the bedside chair but a little bit slumped off to the right side. Heart: RRR. Chest: No labored breathing.  No cough. Abdomen: Distended, soft.  Distant bowel sounds but no tympanitic or tinkling quality bowel sounds. Extremities: Nonpitting pedal swelling. Neuro/Psych: Acknowledges present since of visitor but not verbal today.  Inconsistently following commands, mostly not following commands.  Some upper extremity and head tremors  Intake/Output from previous day: 08/15 0701 - 08/16 0700 In: -  Out: 400 [Urine:400]  Intake/Output this shift: No intake/output data recorded.  Lab Results: Recent Labs    05/06/22 0551 05/07/22 0223  WBC 20.0* 18.4*  HGB 8.9* 8.9*  HCT 28.8* 27.7*  PLT 250 237   BMET Recent Labs    05/06/22 0551 05/07/22 0223  NA 141 138  K 3.7 3.5  CL 103 100  CO2 26 27  GLUCOSE 125* 114*  BUN 40* 32*  CREATININE 1.06* 0.95  CALCIUM 9.2 8.7*   LFT Recent Labs    05/07/22 0223  ALBUMIN 2.4*   PT/INR No results for input(s): "LABPROT", "INR" in the last 72 hours. Hepatitis Panel No results for input(s): "HEPBSAG", "HCVAB", "HEPAIGM",  "HEPBIGM" in the last 72 hours.  Studies/Results: CT ABDOMEN PELVIS W CONTRAST  Result Date: 05/06/2022 CLINICAL DATA:  Bowel obstruction suspected. EXAM: CT ABDOMEN AND PELVIS WITH CONTRAST TECHNIQUE: Multidetector CT imaging of the abdomen and pelvis was performed using the standard protocol following bolus administration of intravenous contrast. RADIATION DOSE REDUCTION: This exam was performed according to the departmental dose-optimization program which includes automated exposure control, adjustment of the mA and/or kV according to patient size and/or use of iterative reconstruction technique. CONTRAST:  156m OMNIPAQUE IOHEXOL 300 MG/ML  SOLN COMPARISON:  CT abdomen 04/13/2022 radiograph FINDINGS: Lower chest: Bibasilar interstitial thickening. Pleuroparenchymal thickening at the LEFT lung base is most prominent (image 22/6 but not changed from comparison CT 04/13/2022. Hepatobiliary: No focal hepatic lesion. Postcholecystectomy. No biliary dilatation. Pancreas: Pancreas is normal. No ductal dilatation. No pancreatic inflammation. Spleen: Normal spleen Adrenals/urinary tract: Adrenal glands and kidneys are normal. The ureters and bladder normal. Stomach/Bowel: Large sliding-type hiatal hernia. The stomach small bowel normal. No small bowel dilatation. Cecum is normal. Post appendectomy. Ascending and transverse colon normal. There are several diverticula of descending colon. The sigmoid colon is redundant but no evidence of obstruction. Contrast flows through the entirety of the bowel into the rectum. Vascular/Lymphatic: Abdominal aorta is normal caliber with atherosclerotic calcification. There is no retroperitoneal or periportal lymphadenopathy. No pelvic lymphadenopathy. Reproductive: Post hysterectomy.  Adnexa  unremarkable Other: No free fluid. Musculoskeletal: No aggressive osseous lesion. IMPRESSION: 1. No evidence of bowel obstruction. Contrast transits entirety of the bowel to the rectum. Sigmoid  colon is redundant. 2. Large sliding-type hiatal hernia. 3. Chronic pleuroparenchymal thickening and interstitial thickening at the lung bases. Electronically Signed   By: Suzy Bouchard M.D.   On: 05/06/2022 19:57   DG Abd Portable 1V  Result Date: 05/06/2022 CLINICAL DATA:  Abdominal distention EXAM: PORTABLE ABDOMEN - 1 VIEW COMPARISON:  03/21/2022 FINDINGS: There is moderate to marked gaseous distention of sigmoid colon. Moderate to large amount of stool is seen in colon. There is no small bowel dilation. Stomach is not distended. IMPRESSION: There is moderate to marked gaseous distention of sigmoid colon. This may be due to ileus or suggest sigmoid volvulus. If there is clinical suspicion for sigmoid volvulus, follow-up CT should be considered. There is no significant small bowel dilation. Moderate to large amount of stool is seen in colon. Electronically Signed   By: Elmer Picker M.D.   On: 05/06/2022 12:23    Scheduled Meds:  amiodarone  200 mg Oral Daily   apixaban  5 mg Oral BID   budesonide (PULMICORT) nebulizer solution  0.25 mg Nebulization BID   Chlorhexidine Gluconate Cloth  6 each Topical Daily   [START ON 05/08/2022] furosemide  40 mg Oral QODAY   lacosamide  200 mg Oral BID   levETIRAcetam  1,000 mg Oral BID   midodrine  5 mg Oral BID WC   montelukast  10 mg Oral QHS   multivitamin with minerals  1 tablet Oral Daily   mouth rinse  15 mL Mouth Rinse 4 times per day   pantoprazole  40 mg Oral BID   rosuvastatin  20 mg Oral Daily   senna-docusate  1 tablet Oral BID   Continuous Infusions: PRN Meds:.acetaminophen **OR** acetaminophen, antiseptic oral rinse, bisacodyl, guaiFENesin, HYDROcodone-acetaminophen, ipratropium-albuterol, labetalol, LORazepam, ondansetron **OR** ondansetron (ZOFRAN) IV, mouth rinse, polyethylene glycol, sodium phosphate  ASSESMENT:     Abdominal distention.  Sigmoid distention, ? Sigmoid volvulus per  KUB 8/15.  CTAP: volvulus, contrast seen  throughout colon and into rectum.  Large HH.  Potassium, magnesium, phosphate ok.  Hx constipation PTA, treated w colace prn.        Senokot bid resumed.      Dysphagia. Cognitive-Linguistic.        Seizures.  S/p craniotomy and Meningioma resection.  Post op strokes.      Afib, on Eliquis.      [place] anemia.  Hgb nadir 6.5 on 7/22.  Rebounded to 10, drifted back to 8.9 and stable there for 5 days.   Low iron, TIBC, sats.  Ferritin 440.  B12, folate okay.  04/2014 colonoscopy.  TA and HP polyps.  Diverticulosis, nonbleeding hemorrhoids.  05/2016 EGD with tubular edematous esophagus, edematous stomach, fixed narrowed pyloric channel.  Pathology: Peptic duodenitis, reactive gastropathy, no H. pylori, no intestinal metaplasia/dysplasia/malignancy.  Continues on bid Protonix per home regimen.     PLAN     Agree w resumption D 2 diet.  Continue Senokot bid vs add additional laxatives or change laxative regimen.      Azucena Freed  05/07/2022, 2:04 PM Phone [phone]

## 2022-05-07 NOTE — Assessment & Plan Note (Addendum)
Previously with Tara [name] Cortrak and tube feedings. Evaluated/followed by speech therapy. -Speech therapy recommendations (8/28)  - regular solids, whole meds with puree (see note)

## 2022-05-07 NOTE — Progress Notes (Signed)
PROGRESS NOTE    Tara [name]  HCW:[id number] DOB: 03/07/1946 DOA: 03/15/2022 PCP: [name], MD   Brief Narrative: 76 year old African-American female PMH of anxiety, depression, type II DM, HLD, PAF, on Eliquis, CKD 3B, breast cancer SP chemoradiation presented to hospital on 6/24 with new onset seizures. Known history of sphenoidal meningioma that was noted to be increased in size on the CT scan with surrounding edema so she is transferred from [facility] for further evaluation.  She underwent LTM with no further seizures and was evaluated by neurosurgery and underwent bifrontal craniotomy for meningioma resection on 03/24/2020 and was transferred to the intensive care unit postoperatively with PCCM consulting while she was in ICU. 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  Treated with Unasyn for 5 days. 7/11 recurrent sz like activity. Not correlated on EEG. CT unchanged.  7/13 neurology signed off. 7/19 started on ABX for sepsis then transition to Unasyn. 7/21 palliative care was consulted. 7/23 CT chest and abdomen was performed negative for PE or significant pneumonia, without any hemorrhage 7/25 MRI brain without contrast shows increasing mass effect, SDH as well as subacute/acute small scattered infarct. 7/26 tube feeds resumed, neurology reconsulted.  Eliquis resumed per neurosurgery 7/27 oxygenation improving, mentation improving, neurology following.  CTA head and neck without any LVO.  Lower extremity Doppler without any DVT.  Echo shows new systolic dysfunction with global hypokinesis EF 40 to 45% and significant RV failure. 7/30 had another aspiration event. Tube feeds on hold. Now on NRB. Remains full code. No  tracheostomy per husband. Per Neurosurgery, give at least two weeks if not more to see if she improves.  8/1 oxygenation improving again to 5 L.  Trickle tube feeds started. 8/9 on D1 honey thick liquid diet.  Calorie count initiated. 8/13 oxygenation improving to 2 L. 8/14 core track removed nocturnal tube feeds stopped. 8/15 leukocytosis worsening, abdomen distended, x-ray abdomen shows evidence of possible sigmoid volvulus, GI consulted, CT abdomen negative for any volvulus and only shows redundant sigmoid colon.  Will resume diet.   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 Know specific infectious source identified although patient has been recently treated for recurrent aspiration pneumonia.  -Trend CBC  Hypertension - Continue  Anxiety and depression Previously on Zoloft and Klonopin which are discontinued.  GERD -Continue Protonix  Hyperlipidemia -Continue Crestor  Dysphagia Previously with a Cortrak and tube feedings. Evaluated/followed by speech therapy. -Speech therapy recommendations (8/15): Diet recommendations: Dysphagia 2 (fine chop);Honey-thick liquid Liquids provided via: Cup;Straw Medication Administration: Crushed with puree Supervision: Full supervision/cueing for compensatory strategies;Staff to assist with self feeding Compensations: Slow rate;Small sips/bites;Multiple dry swallows after each bite/sip;Clear throat intermittently Postural Changes and/or Swallow Maneuvers: Seated upright 90 degrees  Acute postoperative anemia due to expected blood loss Acute anemia down to 6.5 requiring 2 units of PRBC. Hemoglobin currently stable.  Dysfunction of right cardiac ventricle Acute/chronic PE ruled out. Possibly related to pneumonia. Recommendation for repeat Transthoracic Echocardiogram in 6 months. Patient can follow-up with pulmonology.  Acute respiratory failure with  hypoxia (HCC) Secondary to pneumonia with associated tachypnea. Hypoxia improved. -Wean to room air  Atrial fibrillation, chronic ([place]) Patient is on rate/rhythm control and on anticoagulation. -  Continue amiodarone and Eliquis  Acute on chronic combined systolic and diastolic CHF (congestive heart failure) (HCC) Most recent Transthoracic Echocardiogram with LVEF of 40-45% (down from 55%) with global hypokinesis and associated RV dysfunction noted. Patient with acute heart failure this admission that responded to Lasix. Currently resolved. Not on GDMT secondary to recent hypotension. -Continue Lasix  Hypernatremia-resolved as of 05/07/2022 Resolved.  Thrombocytopenia (HCC)-resolved as of 05/07/2022 Resolved.    DVT prophylaxis: Eliquis Code Status:   Code Status: Full Code Family Communication: Sister in Sports coach at bedside Disposition Plan: Per primary    Procedures:    Antimicrobials:     Subjective: Patient not very interactive today.   Objective: BP (!) 127/91 (BP Location: Right Leg)   Pulse 84   Temp 97.7 F (36.5 C) (Axillary)   Resp 19   Ht '4\' 9"'$  (1.448 m)   Wt 74.7 kg   SpO2 97%   BMI 35.64 kg/m   Examination:  General exam: Asleep in her chair Respiratory system: Respiratory effort normal. Cardiovascular system: S1 & S2 heard, RRR. No murmurs, rubs, gallops or clicks. Gastrointestinal system: Abdomen is distended, soft and nontender.  Decreased bowel sounds heard. Central nervous system: Somnolent. Briefly awakens to continual verbal/tactile stimulation but returns to sleep Musculoskeletal: No calf tenderness Skin: No cyanosis. No rashes Psychiatry: Judgement and insight appear normal. Mood & affect appropriate.    Data Reviewed: I have personally reviewed following labs and imaging studies  CBC Lab Results  Component Value Date   WBC 18.4 (H) 05/07/2022   RBC 3.24 (L) 05/07/2022   HGB 8.9 (L) 05/07/2022   HCT 27.7 (L) 05/07/2022   MCV 85.5  05/07/2022   MCH 27.5 05/07/2022   PLT 237 05/07/2022   MCHC 32.1 05/07/2022   RDW 17.0 (H) 05/07/2022   LYMPHSABS 1.8 05/07/2022   MONOABS 1.6 (H) 05/07/2022   EOSABS 1.1 (H) 05/07/2022   BASOSABS 0.1 51/70/0174     Last metabolic panel Lab Results  Component Value Date   NA 138 05/07/2022   K 3.5 05/07/2022   CL 100 05/07/2022   CO2 27 05/07/2022   BUN 32 (H) 05/07/2022   CREATININE 0.95 05/07/2022   GLUCOSE 114 (H) 05/07/2022   GFRNONAA >60 05/07/2022   GFRAA 43 (L) 08/07/2020   CALCIUM 8.7 (L) 05/07/2022   PHOS 5.1 (H) 05/07/2022   PROT 6.3 (L) 04/22/2022   ALBUMIN 2.4 (L) 05/07/2022   LABGLOB 2.7 01/02/2022   AGRATIO 1.5 01/02/2022   BILITOT 0.7 04/22/2022   ALKPHOS 78 04/22/2022   AST 39 04/22/2022   ALT 40 04/22/2022   ANIONGAP 11 05/07/2022    GFR: Estimated Creatinine Clearance: 42.2 mL/min (by C-G formula based on SCr of 0.95 mg/dL).  No results found for this or any previous visit (from the past 240 hour(s)).    Radiology Studies: CT ABDOMEN PELVIS W CONTRAST  Result Date: 05/06/2022 CLINICAL DATA:  Bowel obstruction suspected. EXAM: CT ABDOMEN AND PELVIS WITH CONTRAST TECHNIQUE: Multidetector CT imaging of the abdomen and pelvis was performed using the standard protocol following bolus administration of intravenous contrast. RADIATION DOSE REDUCTION: This exam was performed according to the departmental dose-optimization program which includes automated exposure control, adjustment of the mA and/or kV according to patient size and/or use of iterative reconstruction technique. CONTRAST:  167m OMNIPAQUE IOHEXOL 300 MG/ML  SOLN COMPARISON:  CT abdomen 04/13/2022 radiograph FINDINGS: Lower chest: Bibasilar interstitial thickening. Pleuroparenchymal thickening at the LEFT lung base is most prominent (image 22/6 but not  changed from comparison CT 04/13/2022. Hepatobiliary: No focal hepatic lesion. Postcholecystectomy. No biliary dilatation. Pancreas: Pancreas is  normal. No ductal dilatation. No pancreatic inflammation. Spleen: Normal spleen Adrenals/urinary tract: Adrenal glands and kidneys are normal. The ureters and bladder normal. Stomach/Bowel: Large sliding-type hiatal hernia. The stomach small bowel normal. No small bowel dilatation. Cecum is normal. Post appendectomy. Ascending and transverse colon normal. There are several diverticula of descending colon. The sigmoid colon is redundant but no evidence of obstruction. Contrast flows through the entirety of the bowel into the rectum. Vascular/Lymphatic: Abdominal aorta is normal caliber with atherosclerotic calcification. There is no retroperitoneal or periportal lymphadenopathy. No pelvic lymphadenopathy. Reproductive: Post hysterectomy.  Adnexa unremarkable Other: No free fluid. Musculoskeletal: No aggressive osseous lesion. IMPRESSION: 1. No evidence of bowel obstruction. Contrast transits entirety of the bowel to the rectum. Sigmoid colon is redundant. 2. Large sliding-type hiatal hernia. 3. Chronic pleuroparenchymal thickening and interstitial thickening at the lung bases. Electronically Signed   By: Suzy Bouchard M.D.   On: 05/06/2022 19:57   DG Abd Portable 1V  Result Date: 05/06/2022 CLINICAL DATA:  Abdominal distention EXAM: PORTABLE ABDOMEN - 1 VIEW COMPARISON:  03/21/2022 FINDINGS: There is moderate to marked gaseous distention of sigmoid colon. Moderate to large amount of stool is seen in colon. There is no small bowel dilation. Stomach is not distended. IMPRESSION: There is moderate to marked gaseous distention of sigmoid colon. This may be due to ileus or suggest sigmoid volvulus. If there is clinical suspicion for sigmoid volvulus, follow-up CT should be considered. There is no significant small bowel dilation. Moderate to large amount of stool is seen in colon. Electronically Signed   By: Elmer Picker M.D.   On: 05/06/2022 12:23      LOS: 53 days    Cordelia Poche, MD [facility] 05/07/2022, 6:50 PM   If 7PM-7AM, please contact night-coverage [email or web address]

## 2022-05-08 DIAGNOSIS — J9601 Acute respiratory failure with hypoxia: Secondary | ICD-10-CM | POA: Diagnosis not present

## 2022-05-08 DIAGNOSIS — D62 Acute posthemorrhagic anemia: Secondary | ICD-10-CM | POA: Diagnosis not present

## 2022-05-08 DIAGNOSIS — I5043 Acute on chronic combined systolic (congestive) and diastolic (congestive) heart failure: Secondary | ICD-10-CM | POA: Diagnosis not present

## 2022-05-08 DIAGNOSIS — R569 Unspecified convulsions: Secondary | ICD-10-CM | POA: Diagnosis not present

## 2022-05-08 LAB — CBC
HCT: 30.7 % — ABNORMAL LOW (ref 36.0–46.0)
Hemoglobin: 9.8 g/dL — ABNORMAL LOW (ref 12.0–15.0)
MCH: 27.8 pg (ref 26.0–34.0)
MCHC: 31.9 g/dL (ref 30.0–36.0)
MCV: 87.2 fL (ref 80.0–100.0)
Platelets: 239 10*3/uL (ref 150–400)
RBC: 3.52 MIL/uL — ABNORMAL LOW (ref 3.87–5.11)
RDW: 16.8 % — ABNORMAL HIGH (ref 11.5–15.5)
WBC: 15.5 10*3/uL — ABNORMAL HIGH (ref 4.0–10.5)
nRBC: 0.1 % (ref 0.0–0.2)

## 2022-05-08 NOTE — TOC Progression Note (Signed)
Transition of Care [facility]) - Progression Note    Patient Details  Name: Tara [name] MRN: [id number] Date of Birth: February 28, 1946  Transition of Care [facility]) CM/SW [place],  Phone Number: 05/08/2022, 10:35 AM  Clinical Narrative:     CSW met with patient's spouse,James. CSW introduced self and explained role. CSW informed of CIR has reviewed clinicals and determined patient is not demonstrating significant functional gains for CIR and they will not pursue placement. He advised no one has informed him or family of their decision. CSW advised will have CIR to follow up with family.   CSW discussed short term rehab at [facility]. CSW explained the SNF process and answered all questions. Patient's spouse requested to call and update  his daughter,Rachel.    Left voice message for [name] to return call.   TOC will continue to follow and assist with discharge planning.   Thurmond Butts, MSW, LCSW Clinical Social Worker    Expected Discharge Plan: Skilled Nursing Facility Barriers to Discharge: Continued Medical Work up, Ship broker  Expected Discharge Plan and Services Expected Discharge Plan: [place]   Discharge Planning Services: CM Consult   Living arrangements for the past 2 months: [place]: [place]         Social Determinants of Health (SDOH) Interventions    Readmission Risk Interventions    03/06/2022   12:36 PM  Readmission Risk Prevention Plan  Transportation Screening Complete  HRI or Home Care Consult Complete  Social Work Consult for [place] Planning/Counseling Complete

## 2022-05-08 NOTE — Social Work (Cosign Needed Addendum)
                                                                                                                          Re: Tara [name] Date of Birth: 1946-04-17 Date: 05/08/2022  To Whom It May Concern:  Please be advised that the above-named patient will require a short-term nursing home stay-anticipated 30 days or less for rehabilitation and strengthening. The plan is to return home.

## 2022-05-08 NOTE — Plan of Care (Signed)
Problem: Education: Goal: Ability to describe self-care measures that may prevent or decrease complications (Diabetes Survival Skills Education) will improve Outcome: Progressing Goal: Individualized Educational Video(s) Outcome: Progressing   Problem: Coping: Goal: Ability to adjust to condition or change in health will improve Outcome: Progressing   Problem: Fluid Volume: Goal: Ability to maintain a balanced intake and output will improve Outcome: Progressing   Problem: Health Behavior/Discharge Planning: Goal: Ability to identify and utilize available resources and services will improve Outcome: Progressing Goal: Ability to manage health-related needs will improve Outcome: Progressing   Problem: Metabolic: Goal: Ability to maintain appropriate glucose levels will improve Outcome: Progressing   Problem: Nutritional: Goal: Maintenance of adequate nutrition will improve Outcome: Progressing Goal: Progress toward achieving an optimal weight will improve Outcome: Progressing   Problem: Skin Integrity: Goal: Risk for impaired skin integrity will decrease Outcome: Progressing   Problem: Tissue Perfusion: Goal: Adequacy of tissue perfusion will improve Outcome: Progressing   Problem: Education: Goal: Knowledge of General Education information will improve Description: Including pain rating scale, medication(s)/side effects and non-pharmacologic comfort measures Outcome: Progressing   Problem: Health Behavior/Discharge Planning: Goal: Ability to manage health-related needs will improve Outcome: Progressing   Problem: Clinical Measurements: Goal: Ability to maintain clinical measurements within normal limits will improve Outcome: Progressing Goal: Will remain free from infection Outcome: Progressing Goal: Diagnostic test results will improve Outcome: Progressing Goal: Respiratory complications will improve Outcome: Progressing Goal: Cardiovascular complication will  be avoided Outcome: Progressing   Problem: Activity: Goal: Risk for activity intolerance will decrease Outcome: Progressing   Problem: Nutrition: Goal: Adequate nutrition will be maintained Outcome: Progressing   Problem: Coping: Goal: Level of anxiety will decrease Outcome: Progressing   Problem: Elimination: Goal: Will not experience complications related to bowel motility Outcome: Progressing Goal: Will not experience complications related to urinary retention Outcome: Progressing   Problem: Pain Managment: Goal: General experience of comfort will improve Outcome: Progressing   Problem: Safety: Goal: Ability to remain free from injury will improve Outcome: Progressing   Problem: Skin Integrity: Goal: Risk for impaired skin integrity will decrease Outcome: Progressing   Problem: Education: Goal: Expressions of having a comfortable level of knowledge regarding the disease process will increase Outcome: Progressing   Problem: Coping: Goal: Ability to adjust to condition or change in health will improve Outcome: Progressing Goal: Ability to identify appropriate support needs will improve Outcome: Progressing   Problem: Health Behavior/Discharge Planning: Goal: Compliance with prescribed medication regimen will improve Outcome: Progressing   Problem: Education: Goal: Knowledge of General Education information will improve Description: Including pain rating scale, medication(s)/side effects and non-pharmacologic comfort measures Outcome: Progressing   Problem: Health Behavior/Discharge Planning: Goal: Ability to manage health-related needs will improve Outcome: Progressing   Problem: Clinical Measurements: Goal: Ability to maintain clinical measurements within normal limits will improve Outcome: Progressing Goal: Will remain free from infection Outcome: Progressing Goal: Diagnostic test results will improve Outcome: Progressing Goal: Respiratory  complications will improve Outcome: Progressing Goal: Cardiovascular complication will be avoided Outcome: Progressing   Problem: Activity: Goal: Risk for activity intolerance will decrease Outcome: Progressing   Problem: Nutrition: Goal: Adequate nutrition will be maintained Outcome: Progressing   Problem: Coping: Goal: Level of anxiety will decrease Outcome: Progressing   Problem: Elimination: Goal: Will not experience complications related to bowel motility Outcome: Progressing Goal: Will not experience complications related to urinary retention Outcome: Progressing   Problem: Pain Managment: Goal: General experience of comfort will improve Outcome: Progressing   Problem: Safety: Goal: Ability  to remain free from injury will improve Outcome: Progressing   Problem: Skin Integrity: Goal: Risk for impaired skin integrity will decrease Outcome: Progressing

## 2022-05-08 NOTE — Progress Notes (Signed)
Patient ID: Tara [name], female   DOB: Jan 29, 1946, 76 y.o.   MRN: [id number] BP 125/65 (BP Location: Left Leg)   Pulse 88   Temp 98.2 F (36.8 C) (Oral)   Resp 20   Ht '4\' 9"'$  (1.448 m)   Wt 74.7 kg   SpO2 94%   BMI 35.64 kg/m  Alert, speaking Moving well Placement is the issue. Continue with pt, ot

## 2022-05-08 NOTE — Progress Notes (Signed)
Speech Language Pathology Treatment: Dysphagia;Cognitive-Linquistic  Patient Details Name: Tara [name] MRN: [id number] DOB: Dec 13, 1945 Today's Date: 05/08/2022 Time: 5883-2549 SLP Time Calculation (min) (ACUTE ONLY): 17 min  Assessment / Plan / Recommendation Clinical Impression  Pt was awake sitting in chair with husband at bedside. Inconsistent focused attention when cued to look at therapist. She neglected to respond to questions when provided visual and verbal cues due to decreased initiation. Therapist provided choice of 2 for spatial orientation without response. She automatically opened mouth when presented with solids/liquids via cup by therapist. She required larger bolus to increase awareness and initiate bolus however educated spouse to limit boluses to single sips. She displayed an audible swallow when presented with honey thick liquids but no cough or throat clear and demonstrated prolonged mastication with solids requiring verbal cues. Husband reports she ate all solids at the previous meal without issue. Continue Dys 2 texture, and honey thick liquids. ST will continue to work with patient to progress cognition, communication and swallow.    HPI HPI: Pt is a 76 y/o who presented 6/24 with new onset seizures. 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 EEG negative for seizures. Neurosurgey consulted and did not recommend meningioma resection as of 6/27. Decline in swallow function noted overnight 6/27. Dx acute toxic metabolic encephalopathy suspected secondary to keppra. Pt s/p Bifrontal craniotomy for resection of meningioma 7/3.  Cortrak 7/7. 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  CXR 8/6 concerning for patchy airspace disease. PMH: HTN, HLD, GERD, CKD stage IIIb, chronic HFpEF, meningioma, breast CA-s/p right lumpectomy and chemoradiation in 2008, depression/anxiety. BSE 03/11/22: regular texture diet and thin liquids without need for follow  up.      SLP Plan  Continue with current plan of care      Recommendations for follow up therapy are one component of a multi-disciplinary discharge planning process, led by the attending physician.  Recommendations may be updated based on patient status, additional functional criteria and insurance authorization.    Recommendations  Diet recommendations: Dysphagia 2 (fine chop) Liquids provided via: Cup;Straw;Teaspoon Medication Administration: Crushed with puree Supervision: Full supervision/cueing for compensatory strategies Compensations: Slow rate;Small sips/bites Postural Changes and/or Swallow Maneuvers: Seated upright 90 degrees                General recommendations: Rehab consult Oral Care Recommendations: Oral care BID Follow Up Recommendations: Acute inpatient rehab (3hours/day) Assistance recommended at discharge: Frequent or constant Supervision/Assistance SLP Visit Diagnosis: Dysphagia, unspecified (R13.10);Cognitive communication deficit (R41.841) Plan: Continue with current plan of care          [place]  05/08/2022, 2:38 PM

## 2022-05-08 NOTE — TOC Progression Note (Signed)
Transition of Care [facility]) - Progression Note    Patient Details  Name: Tara [name] MRN: [id number] Date of Birth: [birth year]  Transition of Care [facility]) CM/SW [place],  Phone Number: 05/08/2022, 3:16 PM  Clinical Narrative:     Received call from patient's daughter- she is agreeable to SNF search- preferred SNF [facility]   Expected Discharge Plan: [place] Barriers to Discharge: Continued Medical Work up, Ship broker  Expected Discharge Plan and Services Expected Discharge Plan: [place]   Discharge Planning Services: CM Consult   Living arrangements for the past 2 months: [place]: [place]         Social Determinants of Health (SDOH) Interventions    Readmission Risk Interventions    03/06/2022   12:36 PM  Readmission Risk Prevention Plan  Transportation Screening Complete  HRI or Home Care Consult Complete  Social Work Consult for [place] Planning/Counseling Complete

## 2022-05-08 NOTE — NC FL2 (Signed)
[place] MEDICAID FL2 LEVEL OF CARE SCREENING TOOL     IDENTIFICATION  Patient Name: Tara [name] Birthdate: Sep 18, 1946 Sex: female Admission Date (Current Location): 03/15/2022  [facility] and [place] Number:  [employer] and Address:  The [place] [place] [address] [place], [place] [address]      Provider Number: [id number]  Attending Physician Name and Address:  Ashok Pall, MD  Relative Name and Phone Number:       Current Level of Care: Hospital Recommended Level of Care: [place] Prior Approval Number:    Date Approved/Denied:   PASRR Number: Pending  Discharge Plan: SNF    Current Diagnoses: Patient Active Problem List   Diagnosis Date Noted   Dysfunction of right cardiac ventricle 05/07/2022   Acute postoperative anemia due to expected blood loss 05/07/2022   Dysphagia 05/07/2022   Palliative care encounter    Acute respiratory failure with hypoxia (HCC)    Aspiration into airway    Pressure injury of skin 04/07/2022   Meningioma ([place]) 03/24/2022   Seizure ([place]) 03/15/2022   Atrial fibrillation, chronic ([place]) 03/15/2022   Protein-calorie malnutrition, moderate ([place]) 03/15/2022   Atrial fibrillation with RVR ([place])    Metabolic acidosis 76/16/0737   Acute renal failure superimposed on stage 3b chronic kidney disease ([place]) 02/27/2022   Anxiety and depression 02/27/2022   Meningioma, cerebral ([place]) 02/27/2022   Normocytic anemia 02/27/2022   Acute renal failure (ARF) ([place]) 02/27/2022   Hemorrhoids 01/04/2021   Ankle deformity, right 09/12/2020   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   Port-A-Cath in place 09/28/2018   Colon adenomas 08/06/2018   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 ([place]) 06/09/2018   Hypertension 01/25/2018   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   Acute on chronic combined systolic and diastolic CHF (congestive heart failure) (HCC)    Demand ischemia (HCC)     Prolonged Q-T interval on ECG 09/29/2016   Chronic kidney disease (CKD), stage III (moderate) (HCC) 09/29/2016   Leukocytosis 09/18/2016   Abnormal CT scan, chest    Upper airway cough syndrome  vs Cough variant asthma  04/09/2016   Seasonal allergies 08/08/2014   Family history of colon cancer 03/30/2014   Osteoporosis 10/62/6948   Metabolic syndrome X 54/62/7035   SVT (supraventricular tachycardia) ([place]) 00/93/8182   Chronic systolic CHF (congestive heart failure) ([place]) 03/14/2013   Vitamin D deficiency 07/23/2010   Morbid obesity ([place]) 07/04/2009   Malignant neoplasm of right breast ([place]) 12/13/2007   Hyperlipidemia 12/13/2007   Anxiety state 12/13/2007   Depression, major, single episode, in partial remission ([place]) 12/13/2007   GERD 12/13/2007    Orientation RESPIRATION BLADDER Height & Weight     Self, Time, Situation, Place  Normal Incontinent Weight: 164 lb 10.9 oz (74.7 kg) Height:  '4\' 9"'$  (144.8 cm)  BEHAVIORAL SYMPTOMS/MOOD NEUROLOGICAL BOWEL NUTRITION STATUS      Incontinent Diet (see DC summary)  AMBULATORY STATUS COMMUNICATION OF NEEDS Skin   Extensive Assist Verbally Normal                       Personal Care Assistance Level of Assistance  Bathing, Feeding, Dressing Bathing Assistance: Limited assistance Feeding assistance: Limited assistance Dressing Assistance: Limited assistance     Functional Limitations Info  Sight Sight Info: Impaired        SPECIAL CARE FACTORS FREQUENCY  PT (By licensed PT), OT (By licensed OT), Speech therapy  PT Frequency: 5x/wk OT Frequency: 5x/wk     Speech Therapy Frequency: 5x/wk      Contractures Contractures Info: Not present    Additional Factors Info  Code Status, Allergies Code Status Info: Full Allergies Info: Aspirin, Lisinopril, Sudafed (Pseudoephedrine Hcl) Psychotropic Info: Zoloft '25mg'$  daily, Klonopin 0.5 at bedtime Insulin Sliding Scale Info: see DC summary       Current Medications  (05/08/2022):  This is the current hospital active medication list Current Facility-Administered Medications  Medication Dose Route Frequency Provider Last Rate Last Admin   acetaminophen (TYLENOL) tablet 650 mg  650 mg Oral Q6H PRN Ashok Pall, MD       Or   acetaminophen (TYLENOL) suppository 650 mg  650 mg Rectal Q6H PRN Ashok Pall, MD       amiodarone (PACERONE) tablet 200 mg  200 mg Oral Daily Ashok Pall, MD   200 mg at 05/07/22 4627   antiseptic oral rinse (BIOTENE) solution 15 mL  15 mL Mouth Rinse PRN Meyran, Ocie Cornfield, NP       apixaban Arne [place]) tablet 5 mg  5 mg Oral BID Ashok Pall, MD   5 mg at 05/07/22 2218   bisacodyl (DULCOLAX) suppository 10 mg  10 mg Rectal Daily PRN Eleonore Chiquito, NP   10 mg at 03/31/22 0842   budesonide (PULMICORT) nebulizer solution 0.25 mg  0.25 mg Nebulization BID Alma Friendly, MD   0.25 mg at 05/08/22 0750   Chlorhexidine Gluconate Cloth 2 % PADS 6 each  6 each Topical Daily Meyran, Ocie Cornfield, NP   6 each at 05/06/22 2122   furosemide (LASIX) tablet 40 mg  40 mg Oral Ervin Knack, MD       guaiFENesin (ROBITUSSIN) 100 MG/5ML liquid 5 mL  5 mL Oral Q4H PRN Ashok Pall, MD       HYDROcodone-acetaminophen (NORCO/VICODIN) 5-325 MG per tablet 1 tablet  1 tablet Oral Q4H PRN Lavina Hamman, MD       ipratropium-albuterol (DUONEB) 0.5-2.5 (3) MG/3ML nebulizer solution 3 mL  3 mL Nebulization Q6H PRN Ashok Pall, MD       labetalol (NORMODYNE) injection 10-40 mg  10-40 mg Intravenous Q10 min PRN Meyran, Ocie Cornfield, NP   10 mg at 03/28/22 1032   lacosamide (VIMPAT) tablet 200 mg  200 mg Oral BID Ashok Pall, MD   200 mg at 05/07/22 2218   levETIRAcetam (KEPPRA) tablet 1,000 mg  1,000 mg Oral BID Ashok Pall, MD   1,000 mg at 05/07/22 2218   LORazepam (ATIVAN) injection 1 mg  1 mg Intravenous Q10 min PRN Lavina Hamman, MD       midodrine (PROAMATINE) tablet 5 mg  5 mg Oral BID WC Ashok Pall, MD   5  mg at 05/07/22 1705   montelukast (SINGULAIR) tablet 10 mg  10 mg Oral QHS Ashok Pall, MD   10 mg at 05/07/22 2218   multivitamin with minerals tablet 1 tablet  1 tablet Oral Daily Ashok Pall, MD   1 tablet at 05/07/22 0925   ondansetron (ZOFRAN) tablet 4 mg  4 mg Oral Q6H PRN Ashok Pall, MD       Or   ondansetron (ZOFRAN) injection 4 mg  4 mg Intravenous Q6H PRN Ashok Pall, MD       Oral care mouth rinse  15 mL Mouth Rinse 4 times per day Eleonore Chiquito, NP   15 mL at 05/07/22 2232   Oral care  mouth rinse  15 mL Mouth Rinse PRN Ashok Pall, MD       pantoprazole (PROTONIX) EC tablet 40 mg  40 mg Oral BID Ashok Pall, MD   40 mg at 05/07/22 2218   polyethylene glycol (MIRALAX / GLYCOLAX) packet 17 g  17 g Oral Daily PRN Lavina Hamman, MD       rosuvastatin (CRESTOR) tablet 20 mg  20 mg Oral Daily Ashok Pall, MD   20 mg at 05/07/22 2902   senna-docusate (Senokot-S) tablet 1 tablet  1 tablet Oral BID Lavina Hamman, MD   1 tablet at 05/07/22 2218   sodium phosphate (FLEET) 7-19 GM/118ML enema 1 enema  1 enema Rectal Once PRN Meyran, Ocie Cornfield, NP         Discharge Medications: Please see discharge summary for a list of discharge medications.  Relevant Imaging Results:  Relevant Lab Results:   Additional Information SS#: [id number]  Vinie Sill, LCSW

## 2022-05-08 NOTE — Progress Notes (Signed)
Orders to wean patient to room air   2000- Weaned to 3L [place] sating at 97% RR 20  0000- Weaned to 2L [place] sating at 98% RR 20  0430- Weaned to 1L [place] sating at 90-91% [phone]

## 2022-05-08 NOTE — Progress Notes (Signed)
PROGRESS NOTE    Tara [name]  IRS:[id number] DOB: [birth year] DOA: 03/15/2022 PCP: [name], MD   Brief Narrative: 76 year old African-American female PMH of anxiety, depression, type II DM, HLD, PAF, on Eliquis, CKD 3B, breast cancer SP chemoradiation presented to hospital on 6/24 with new onset seizures. Known history of sphenoidal meningioma that was noted to be increased in size on the CT scan with surrounding edema so she is transferred from [facility] for further evaluation.  She underwent LTM with no further seizures and was evaluated by neurosurgery and underwent bifrontal craniotomy for meningioma resection on 03/24/2020 and was transferred to the intensive care unit postoperatively with PCCM consulting while she was in ICU. 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  Treated with Unasyn for 5 days. 7/11 recurrent sz like activity. Not correlated on EEG. CT unchanged.  7/13 neurology signed off. 7/19 started on ABX for sepsis then transition to Unasyn. 7/21 palliative care was consulted. 7/23 CT chest and abdomen was performed negative for PE or significant pneumonia, without any hemorrhage 7/25 MRI brain without contrast shows increasing mass effect, SDH as well as subacute/acute small scattered infarct. 7/26 tube feeds resumed, neurology reconsulted.  Eliquis resumed per neurosurgery 7/27 oxygenation improving, mentation improving, neurology following.  CTA head and neck without any LVO.  Lower extremity Doppler without any DVT.  Echo shows new systolic dysfunction with global hypokinesis EF 40 to 45% and significant RV failure. 7/30 had another aspiration event. Tube feeds on hold. Now on NRB. Remains full code. No  tracheostomy per husband. Per Neurosurgery, give at least two weeks if not more to see if she improves.  8/1 oxygenation improving again to 5 L.  Trickle tube feeds started. 8/9 on D1 honey thick liquid diet.  Calorie count initiated. 8/13 oxygenation improving to 2 L. 8/14 core track removed nocturnal tube feeds stopped. 8/15 leukocytosis worsening, abdomen distended, x-ray abdomen shows evidence of possible sigmoid volvulus, GI consulted, CT abdomen negative for any volvulus and only shows redundant sigmoid colon.  Will resume diet.   Assessment and Plan: * Seizure ([place]) -Continue Keppra  Meningioma, cerebral ([place]) Per neurosurgery  Leukocytosis Recurrent. Know specific infectious source identified although patient has been recently treated for recurrent aspiration pneumonia.  -Trend CBC  Hypertension - Continue  Anxiety and depression Previously on Zoloft and Klonopin which are discontinued.  GERD -Continue Protonix  Hyperlipidemia -Continue Crestor  Dysphagia Previously with a Cortrak and tube feedings. Evaluated/followed by speech therapy. -Speech therapy recommendations (8/15): Diet recommendations: Dysphagia 2 (fine chop);Honey-thick liquid Liquids provided via: Cup;Straw Medication Administration: Crushed with puree Supervision: Full supervision/cueing for compensatory strategies;Staff to assist with self feeding Compensations: Slow rate;Small sips/bites;Multiple dry swallows after each bite/sip;Clear throat intermittently Postural Changes and/or Swallow Maneuvers: Seated upright 90 degrees  Acute postoperative anemia due to expected blood loss Acute anemia down to 6.5 requiring 2 units of PRBC. Hemoglobin currently stable.  Dysfunction of right cardiac ventricle Acute/chronic PE ruled out. Possibly related to pneumonia. Recommendation for repeat Transthoracic Echocardiogram in 6 months. Patient can follow-up with pulmonology.  Acute respiratory failure with  hypoxia (HCC) Secondary to pneumonia with associated tachypnea. Hypoxia improved. -Wean to room air  Atrial fibrillation, chronic ([place]) Patient is on rate/rhythm control and on anticoagulation. -  Continue amiodarone and Eliquis  Acute on chronic combined systolic and diastolic CHF (congestive heart failure) (HCC) Most recent Transthoracic Echocardiogram with LVEF of 40-45% (down from 55%) with global hypokinesis and associated RV dysfunction noted. Patient with acute heart failure this admission that responded to Lasix. Currently resolved. Not on GDMT secondary to recent hypotension. -Continue Lasix  Hypernatremia-resolved as of 05/07/2022 Resolved.  Thrombocytopenia (HCC)-resolved as of 05/07/2022 Resolved.    DVT prophylaxis: Eliquis Code Status:   Code Status: Full Code Family Communication: Husband at bedside Disposition Plan: Per primary    Procedures:    Antimicrobials:     Subjective: Patient reports no issues this morning.  Objective: BP 133/70 (BP Location: Right Leg)   Pulse 89   Temp 98.7 F (37.1 C) (Oral)   Resp 16   Ht '4\' 9"'$  (1.448 m)   Wt 74.7 kg   SpO2 97%   BMI 35.64 kg/m   Examination:  General exam: Appears calm and comfortable Respiratory system: Clear to auscultation. Respiratory effort normal. Cardiovascular system: S1 & S2 heard, RRR. Gastrointestinal system: Abdomen is nondistended, soft and nontender. No organomegaly or masses felt. Normal bowel sounds heard. Central nervous system: Alert but does not open her eyes when speaking. Answers some questions, but answers them appropriately Musculoskeletal: No calf tenderness Skin: No cyanosis. No rashes   Data Reviewed: I have personally reviewed following labs and imaging studies  CBC Lab Results  Component Value Date   WBC 18.4 (H) 05/07/2022   RBC 3.24 (L) 05/07/2022   HGB 8.9 (L) 05/07/2022   HCT 27.7 (L) 05/07/2022   MCV 85.5 05/07/2022   MCH 27.5 05/07/2022   PLT 237  05/07/2022   MCHC 32.1 05/07/2022   RDW 17.0 (H) 05/07/2022   LYMPHSABS 1.8 05/07/2022   MONOABS 1.6 (H) 05/07/2022   EOSABS 1.1 (H) 05/07/2022   BASOSABS 0.1 11/94/1740     Last metabolic panel Lab Results  Component Value Date   NA 138 05/07/2022   K 3.5 05/07/2022   CL 100 05/07/2022   CO2 27 05/07/2022   BUN 32 (H) 05/07/2022   CREATININE 0.95 05/07/2022   GLUCOSE 114 (H) 05/07/2022   GFRNONAA >60 05/07/2022   GFRAA 43 (L) 08/07/2020   CALCIUM 8.7 (L) 05/07/2022   PHOS 5.1 (H) 05/07/2022   PROT 6.3 (L) 04/22/2022   ALBUMIN 2.4 (L) 05/07/2022   LABGLOB 2.7 01/02/2022   AGRATIO 1.5 01/02/2022   BILITOT 0.7 04/22/2022   ALKPHOS 78 04/22/2022   AST 39 04/22/2022   ALT 40 04/22/2022   ANIONGAP 11 05/07/2022    GFR: Estimated Creatinine Clearance: 42.2 mL/min (by C-G formula based on SCr of 0.95 mg/dL).  No results found for this or any previous visit (from the past 240 hour(s)).    Radiology Studies: CT ABDOMEN PELVIS W CONTRAST  Result Date: 05/06/2022 CLINICAL DATA:  Bowel obstruction suspected. EXAM: CT ABDOMEN AND PELVIS WITH CONTRAST TECHNIQUE: Multidetector CT imaging of the abdomen and pelvis was performed using the standard protocol following bolus administration of intravenous contrast. RADIATION DOSE REDUCTION: This exam was performed according to the departmental dose-optimization program which includes automated exposure control, adjustment of the mA and/or kV according to patient size and/or use of iterative reconstruction technique. CONTRAST:  175m OMNIPAQUE IOHEXOL 300 MG/ML  SOLN COMPARISON:  CT abdomen 04/13/2022 radiograph FINDINGS: Lower chest: Bibasilar interstitial thickening. Pleuroparenchymal thickening at the LEFT lung base is most prominent (image 22/6 but not changed from comparison CT 04/13/2022. Hepatobiliary: No focal  hepatic lesion. Postcholecystectomy. No biliary dilatation. Pancreas: Pancreas is normal. No ductal dilatation. No pancreatic  inflammation. Spleen: Normal spleen Adrenals/urinary tract: Adrenal glands and kidneys are normal. The ureters and bladder normal. Stomach/Bowel: Large sliding-type hiatal hernia. The stomach small bowel normal. No small bowel dilatation. Cecum is normal. Post appendectomy. Ascending and transverse colon normal. There are several diverticula of descending colon. The sigmoid colon is redundant but no evidence of obstruction. Contrast flows through the entirety of the bowel into the rectum. Vascular/Lymphatic: Abdominal aorta is normal caliber with atherosclerotic calcification. There is no retroperitoneal or periportal lymphadenopathy. No pelvic lymphadenopathy. Reproductive: Post hysterectomy.  Adnexa unremarkable Other: No free fluid. Musculoskeletal: No aggressive osseous lesion. IMPRESSION: 1. No evidence of bowel obstruction. Contrast transits entirety of the bowel to the rectum. Sigmoid colon is redundant. 2. Large sliding-type hiatal hernia. 3. Chronic pleuroparenchymal thickening and interstitial thickening at the lung bases. Electronically Signed   By: Suzy Bouchard M.D.   On: 05/06/2022 19:57   DG Abd Portable 1V  Result Date: 05/06/2022 CLINICAL DATA:  Abdominal distention EXAM: PORTABLE ABDOMEN - 1 VIEW COMPARISON:  03/21/2022 FINDINGS: There is moderate to marked gaseous distention of sigmoid colon. Moderate to large amount of stool is seen in colon. There is no small bowel dilation. Stomach is not distended. IMPRESSION: There is moderate to marked gaseous distention of sigmoid colon. This may be due to ileus or suggest sigmoid volvulus. If there is clinical suspicion for sigmoid volvulus, follow-up CT should be considered. There is no significant small bowel dilation. Moderate to large amount of stool is seen in colon. Electronically Signed   By: Elmer Picker M.D.   On: 05/06/2022 12:23      LOS: 54 days    Cordelia Poche, MD [facility] 05/08/2022, 10:42 AM   If 7PM-7AM,  please contact night-coverage [email or web address]

## 2022-05-08 NOTE — Progress Notes (Signed)
When RN weaned patient to 1L [place] patient gradually desated to 88% sustained. RN increased oxygen back to 2L [place] sating at 94%.

## 2022-05-08 NOTE — Progress Notes (Signed)
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 s/p tumor resection 7/3. Recent DC from [facility] 6/22. MRI on 7/26 shows: SDH extending inferiorly along the clivus and  narrowing the foramen magnum with possible mass effect. PMH: CKD stage 3, HFpEF, a fib, meningioma.    PT Comments    The pt was agreeable to session, initially lethargic with eyes closed but does open with change in position to sitting EOB. The pt was able to make short statements such as "I'm fine" "good morning" or "no" when asked about pain. She was unable to answer any other questions such as "what is your granddaughter's name?" Or "what season is it?" The pt remains dependent on therapist assist for any OOB movement, remains totalA to complete transfer at this time. The pt did demo slight movement to command in RLE, no initiation of movement to command in LLE or UE this session.    Recommendations for follow up therapy are one component of a multi-disciplinary discharge planning process, led by the attending physician.  Recommendations may be updated based on patient status, additional functional criteria and insurance authorization.  Follow Up Recommendations  Acute inpatient rehab (3hours/day) Can patient physically be transported by private vehicle: No   Assistance Recommended at Discharge Frequent or constant Supervision/Assistance  Patient can return home with the following Two people to help with walking and/or transfers;Two people to help with bathing/dressing/bathroom;Assist for transportation;Assistance with feeding;Assistance with Education officer, environmental (measurements PT);Wheelchair cushion (measurements PT);Hospital bed;Other (comment);BSC/3in1    Recommendations for Other Services       Precautions /  Restrictions Precautions Precautions: Fall Precaution Comments: seizures Restrictions Weight Bearing Restrictions: No     Mobility  Bed Mobility Overal bed mobility: Needs Assistance Bed Mobility: Supine to Sit     Supine to sit: Total assist, HOB elevated     General bed mobility comments: totalA with no initiation from pt    Transfers Overall transfer level: Needs assistance Equipment used: 1 person hand held assist Transfers: Sit to/from Stand, Bed to chair/wheelchair/BSC Sit to Stand: Max assist     Squat pivot transfers: Total assist     General transfer comment: pt with slight assist in BLE to complete sit-stand, mostly dependent on therapist to rise and maintain upright. no initiation to assist wtih transfer. sit-stand x5 in session    Ambulation/Gait               General Gait Details: unable to manage, total A to maintain static stance    Modified Rankin (Stroke Patients Only) Modified Rankin (Stroke Patients Only) Pre-Morbid Rankin Score: Moderately severe disability Modified Rankin: Severe disability     Balance Overall balance assessment: Needs assistance Sitting-balance support: Feet supported Sitting balance-Leahy Scale: Poor Sitting balance - Comments: leaning posteriorly and to L through session, limited ability to correct but did attempt to use LUE to assist. Postural control: Posterior lean, Left lateral lean   Standing balance-Leahy Scale: Zero Standing balance comment: dependent on therapist                            Cognition Arousal/Alertness: Lethargic Behavior During Therapy: Flat affect Overall Cognitive Status: Difficult to assess Area of Impairment: Attention, Following commands, Safety/judgement, Problem solving, Orientation, Memory  Orientation Level: Disoriented to, Time, Situation Current Attention Level: Focused Memory: Decreased short-term memory, Decreased recall of  precautions Following Commands: Follows one step commands inconsistently, Follows one step commands with increased time Safety/Judgement: Decreased awareness of deficits, Decreased awareness of safety Awareness: Intellectual Problem Solving: Slow processing, Decreased initiation, Difficulty sequencing, Requires verbal cues, Requires tactile cues General Comments: pt states "no" in response to questions about pain, states "[place]" after significant delay (8-10 seconds) when asked where she is. Unable to state no or answer other orientation questions such as "what is the date?" "what is the month?" or "what season are we in?" pt needing increased time for all command following, completing ~25% of actions to commands. x1 with trunk movement, x2 with RLE, x0 with LLE        Exercises      General Comments General comments (skin integrity, edema, etc.): SpO2 to 90% on 1L with sitting, improved to 95% when positioned in chair, VSS      Pertinent Vitals/Pain Pain Assessment Pain Assessment: No/denies pain Faces Pain Scale: No hurt Pain Location: pt reponds "no" Pain Intervention(s): Monitored during session     PT Goals (current goals can now be found in the care plan section) Acute Rehab PT Goals Patient Stated Goal: per family, to return to independence, plan to wait 2 weeks (8/13) to see improvement PT Goal Formulation: With family Time For Goal Achievement: 05/14/22 Potential to Achieve Goals: Fair Progress towards PT goals: Progressing toward goals    Frequency    Min 3X/week      PT Plan Current plan remains appropriate       AM-PAC PT "6 Clicks" Mobility   Outcome Measure  Help needed turning from your back to your side while in a flat bed without using bedrails?: Total Help needed moving from lying on your back to sitting on the side of a flat bed without using bedrails?: Total Help needed moving to and from a bed to a chair (including a wheelchair)?: Total Help  needed standing up from a chair using your arms (e.g., wheelchair or bedside chair)?: Total Help needed to walk in hospital room?: Total Help needed climbing 3-5 steps with a railing? : Total 6 Click Score: 6    End of Session Equipment Utilized During Treatment: Gait belt;Oxygen Activity Tolerance: Patient tolerated treatment well Patient left: in chair;with call bell/phone within reach;with nursing/sitter in room;with family/visitor present Nurse Communication: Mobility status PT Visit Diagnosis: Other abnormalities of gait and mobility (R26.89);Other symptoms and signs involving the nervous system (R29.898)     Time: 7096-2836 PT Time Calculation (min) (ACUTE ONLY): 29 min  Charges:  $Therapeutic Exercise: 8-22 mins $Therapeutic Activity: 8-22 mins                     West Carbo, PT, DPT   Acute Rehabilitation Department   Sandra Cockayne 05/08/2022, 9:07 AM

## 2022-05-09 DIAGNOSIS — D62 Acute posthemorrhagic anemia: Secondary | ICD-10-CM | POA: Diagnosis not present

## 2022-05-09 DIAGNOSIS — I5043 Acute on chronic combined systolic (congestive) and diastolic (congestive) heart failure: Secondary | ICD-10-CM | POA: Diagnosis not present

## 2022-05-09 DIAGNOSIS — J9601 Acute respiratory failure with hypoxia: Secondary | ICD-10-CM | POA: Diagnosis not present

## 2022-05-09 DIAGNOSIS — R569 Unspecified convulsions: Secondary | ICD-10-CM | POA: Diagnosis not present

## 2022-05-09 LAB — CBC
HCT: 29.2 % — ABNORMAL LOW (ref 36.0–46.0)
Hemoglobin: 9.4 g/dL — ABNORMAL LOW (ref 12.0–15.0)
MCH: 27.6 pg (ref 26.0–34.0)
MCHC: 32.2 g/dL (ref 30.0–36.0)
MCV: 85.9 fL (ref 80.0–100.0)
Platelets: 253 10*3/uL (ref 150–400)
RBC: 3.4 MIL/uL — ABNORMAL LOW (ref 3.87–5.11)
RDW: 16.9 % — ABNORMAL HIGH (ref 11.5–15.5)
WBC: 19.9 10*3/uL — ABNORMAL HIGH (ref 4.0–10.5)
nRBC: 0 % (ref 0.0–0.2)

## 2022-05-09 LAB — BASIC METABOLIC PANEL
Anion gap: 13 (ref 5–15)
BUN: 28 mg/dL — ABNORMAL HIGH (ref 8–23)
CO2: 25 mmol/L (ref 22–32)
Calcium: 8.9 mg/dL (ref 8.9–10.3)
Chloride: 101 mmol/L (ref 98–111)
Creatinine, Ser: 1.36 mg/dL — ABNORMAL HIGH (ref 0.44–1.00)
GFR, Estimated: 40 mL/min — ABNORMAL LOW (ref >60)
Glucose, Bld: 125 mg/dL — ABNORMAL HIGH (ref 70–99)
Potassium: 3.1 mmol/L — ABNORMAL LOW (ref 3.5–5.1)
Sodium: 139 mmol/L (ref 135–145)

## 2022-05-09 MED ORDER — POTASSIUM CHLORIDE CRYS ER 20 MEQ PO TBCR
40.0000 meq | EXTENDED_RELEASE_TABLET | Freq: Once | ORAL | Status: AC
  Administered 2022-05-09: 40 meq via ORAL
  Filled 2022-05-09: qty 2

## 2022-05-09 NOTE — TOC Progression Note (Signed)
Transition of Care [facility]) - Progression Note    Patient Details  Name: Tara [name] MRN: [id number] Date of Birth: Oct 05, 1945  Transition of Care [facility] CM/SW [place], [place] Phone Number: 05/09/2022, 1:52 PM  Clinical Narrative:     CSW uploaded clinicals to PASRR # - pending   CSW spoke with patients daughter- CSW provided bed offer.   Sent message to [facility]- requested they review - waiting on decision.  TOC will continue to follow and assist with discharge planning.   Thurmond Butts, MSW, LCSW Clinical Social Worker      Expected Discharge Plan: Skilled Nursing Facility Barriers to Discharge: Continued Medical Work up, Ship broker  Expected Discharge Plan and Services Expected Discharge Plan: [place]   Discharge Planning Services: CM Consult   Living arrangements for the past 2 months: [place]: [place]         Social Determinants of Health (SDOH) Interventions    Readmission Risk Interventions    03/06/2022   12:36 PM  Readmission Risk Prevention Plan  Transportation Screening Complete  HRI or Home Care Consult Complete  Social Work Consult for [place] Planning/Counseling Complete

## 2022-05-09 NOTE — Assessment & Plan Note (Signed)
Possibly secondary to lasix. Weight appears to have downtrended from admission but is stable for the past week. -Repeat BMP in AM

## 2022-05-09 NOTE — Plan of Care (Signed)
Problem: Education: Goal: Ability to describe self-care measures that may prevent or decrease complications (Diabetes Survival Skills Education) will improve Outcome: Progressing Goal: Individualized Educational Video(s) Outcome: Progressing   Problem: Coping: Goal: Ability to adjust to condition or change in health will improve Outcome: Progressing   Problem: Fluid Volume: Goal: Ability to maintain a balanced intake and output will improve Outcome: Progressing   Problem: Health Behavior/Discharge Planning: Goal: Ability to identify and utilize available resources and services will improve Outcome: Progressing Goal: Ability to manage health-related needs will improve Outcome: Progressing   Problem: Metabolic: Goal: Ability to maintain appropriate glucose levels will improve Outcome: Progressing   Problem: Nutritional: Goal: Maintenance of adequate nutrition will improve Outcome: Progressing Goal: Progress toward achieving an optimal weight will improve Outcome: Progressing   Problem: Skin Integrity: Goal: Risk for impaired skin integrity will decrease Outcome: Progressing   Problem: Tissue Perfusion: Goal: Adequacy of tissue perfusion will improve Outcome: Progressing   Problem: Education: Goal: Knowledge of General Education information will improve Description: Including pain rating scale, medication(s)/side effects and non-pharmacologic comfort measures Outcome: Progressing   Problem: Health Behavior/Discharge Planning: Goal: Ability to manage health-related needs will improve Outcome: Progressing   Problem: Clinical Measurements: Goal: Ability to maintain clinical measurements within normal limits will improve Outcome: Progressing Goal: Will remain free from infection Outcome: Progressing Goal: Diagnostic test results will improve Outcome: Progressing Goal: Respiratory complications will improve Outcome: Progressing Goal: Cardiovascular complication will  be avoided Outcome: Progressing   Problem: Activity: Goal: Risk for activity intolerance will decrease Outcome: Progressing   Problem: Nutrition: Goal: Adequate nutrition will be maintained Outcome: Progressing   Problem: Coping: Goal: Level of anxiety will decrease Outcome: Progressing   Problem: Elimination: Goal: Will not experience complications related to bowel motility Outcome: Progressing Goal: Will not experience complications related to urinary retention Outcome: Progressing   Problem: Pain Managment: Goal: General experience of comfort will improve Outcome: Progressing   Problem: Safety: Goal: Ability to remain free from injury will improve Outcome: Progressing   Problem: Skin Integrity: Goal: Risk for impaired skin integrity will decrease Outcome: Progressing   Problem: Education: Goal: Expressions of having a comfortable level of knowledge regarding the disease process will increase Outcome: Progressing   Problem: Coping: Goal: Ability to adjust to condition or change in health will improve Outcome: Progressing Goal: Ability to identify appropriate support needs will improve Outcome: Progressing   Problem: Health Behavior/Discharge Planning: Goal: Compliance with prescribed medication regimen will improve Outcome: Progressing   Problem: Education: Goal: Knowledge of General Education information will improve Description: Including pain rating scale, medication(s)/side effects and non-pharmacologic comfort measures Outcome: Progressing   Problem: Health Behavior/Discharge Planning: Goal: Ability to manage health-related needs will improve Outcome: Progressing   Problem: Clinical Measurements: Goal: Ability to maintain clinical measurements within normal limits will improve Outcome: Progressing Goal: Will remain free from infection Outcome: Progressing Goal: Diagnostic test results will improve Outcome: Progressing Goal: Respiratory  complications will improve Outcome: Progressing Goal: Cardiovascular complication will be avoided Outcome: Progressing   Problem: Activity: Goal: Risk for activity intolerance will decrease Outcome: Progressing   Problem: Nutrition: Goal: Adequate nutrition will be maintained Outcome: Progressing   Problem: Coping: Goal: Level of anxiety will decrease Outcome: Progressing   Problem: Elimination: Goal: Will not experience complications related to bowel motility Outcome: Progressing Goal: Will not experience complications related to urinary retention Outcome: Progressing   Problem: Pain Managment: Goal: General experience of comfort will improve Outcome: Progressing   Problem: Safety: Goal: Ability  to remain free from injury will improve Outcome: Progressing   Problem: Skin Integrity: Goal: Risk for impaired skin integrity will decrease Outcome: Progressing

## 2022-05-09 NOTE — Progress Notes (Signed)
Patient ID: Tara [name], female   DOB: Feb 17, 1946, 76 y.o.   MRN: [id number] BP 138/74 (BP Location: Left Leg)   Pulse 91   Temp 97.6 F (36.4 C) (Oral)   Resp (!) 21   Ht '4\' 9"'$  (1.448 m)   Wt 74.7 kg   SpO2 90%   BMI 35.64 kg/m  Alert, speaking Family is considering options  Moving all extremities Continuing to work with pt, ot

## 2022-05-09 NOTE — Progress Notes (Signed)
PROGRESS NOTE    Tara [name]  FBP:[id number] DOB: [birth year] DOA: 03/15/2022 PCP: [name], MD   Brief Narrative: 76 year old African-American female PMH of anxiety, depression, type II DM, HLD, PAF, on Eliquis, CKD 3B, breast cancer SP chemoradiation presented to hospital on 6/24 with new onset seizures. Known history of sphenoidal meningioma that was noted to be increased in size on the CT scan with surrounding edema so she is transferred from [facility] for further evaluation.  She underwent LTM with no further seizures and was evaluated by neurosurgery and underwent bifrontal craniotomy for meningioma resection on 03/24/2020 and was transferred to the intensive care unit postoperatively with PCCM consulting while she was in ICU. 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  Treated with Unasyn for 5 days. 7/11 recurrent sz like activity. Not correlated on EEG. CT unchanged.  7/13 neurology signed off. 7/19 started on ABX for sepsis then transition to Unasyn. 7/21 palliative care was consulted. 7/23 CT chest and abdomen was performed negative for PE or significant pneumonia, without any hemorrhage 7/25 MRI brain without contrast shows increasing mass effect, SDH as well as subacute/acute small scattered infarct. 7/26 tube feeds resumed, neurology reconsulted.  Eliquis resumed per neurosurgery 7/27 oxygenation improving, mentation improving, neurology following.  CTA head and neck without any LVO.  Lower extremity Doppler without any DVT.  Echo shows new systolic dysfunction with global hypokinesis EF 40 to 45% and significant RV failure. 7/30 had another aspiration event. Tube feeds on hold. Now on NRB. Remains full code. No  tracheostomy per husband. Per Neurosurgery, give at least two weeks if not more to see if she improves.  8/1 oxygenation improving again to 5 L.  Trickle tube feeds started. 8/9 on D1 honey thick liquid diet.  Calorie count initiated. 8/13 oxygenation improving to 2 L. 8/14 core track removed nocturnal tube feeds stopped. 8/15 leukocytosis worsening, abdomen distended, x-ray abdomen shows evidence of possible sigmoid volvulus, GI consulted, CT abdomen negative for any volvulus and only shows redundant sigmoid colon.  Will resume diet.   Assessment and Plan: * Seizure ([place]) -Continue Keppra  Meningioma, cerebral ([place]) Per neurosurgery  Leukocytosis Recurrent. Know specific infectious source identified although patient has been recently treated for recurrent aspiration pneumonia.  -Trend CBC  Hypertension - Continue  Anxiety and depression Previously on Zoloft and Klonopin which are discontinued.  GERD -Continue Protonix  Hyperlipidemia -Continue Crestor  Dysphagia Previously with a Cortrak and tube feedings. Evaluated/followed by speech therapy. -Speech therapy recommendations (8/15): Diet recommendations: Dysphagia 2 (fine chop);Honey-thick liquid Liquids provided via: Cup;Straw Medication Administration: Crushed with puree Supervision: Full supervision/cueing for compensatory strategies;Staff to assist with self feeding Compensations: Slow rate;Small sips/bites;Multiple dry swallows after each bite/sip;Clear throat intermittently Postural Changes and/or Swallow Maneuvers: Seated upright 90 degrees  Acute postoperative anemia due to expected blood loss Acute anemia down to 6.5 requiring 2 units of PRBC. Hemoglobin currently stable.  Dysfunction of right cardiac ventricle Acute/chronic PE ruled out. Possibly related to pneumonia. Recommendation for repeat Transthoracic Echocardiogram in 6 months. Patient can follow-up with pulmonology.  Acute respiratory failure with  hypoxia (HCC) Secondary to pneumonia with associated tachypnea. Hypoxia improved. -Wean to room air as able.  Atrial fibrillation, chronic ([place]) Patient is on rate/rhythm control and  on anticoagulation. -Continue amiodarone and Eliquis  AKI (acute kidney injury) ([place]) Possibly secondary to lasix. Weight appears to have downtrended from admission but is stable for the past week. -Repeat BMP in AM  Acute on chronic combined systolic and diastolic CHF (congestive heart failure) ([place]) Most recent Transthoracic Echocardiogram with LVEF of 40-45% (down from 55%) with global hypokinesis and associated RV dysfunction noted. Patient with acute heart failure this admission that responded to Lasix. Currently resolved. Not on GDMT secondary to recent hypotension. -Continue Lasix  Hypernatremia-resolved as of 05/07/2022 Resolved.  Thrombocytopenia (HCC)-resolved as of 05/07/2022 Resolved.    DVT prophylaxis: Eliquis Code Status:   Code Status: Full Code Family Communication: Sisters and neice at bedside Disposition Plan: Per primary    Procedures:    Antimicrobials:     Subjective: No issues noted overnight per nursing except inability to wean oxygen to room air.  Objective: BP (!) 104/59 (BP Location: Left Leg)   Pulse 87   Temp 97.9 F (36.6 C) (Axillary)   Resp 18   Ht '4\' 9"'$  (1.448 m)   Wt 74.7 kg   SpO2 100%   BMI 35.64 kg/m   Examination:  General exam: Appears calm and comfortable Respiratory system: Clear to auscultation. Respiratory effort normal. Cardiovascular system: S1 & S2 heard, RRR. Gastrointestinal system: Abdomen is distended, soft and non-tender. Normal bowel sounds heard. Central nervous system: Alert. Musculoskeletal: No calf tenderness   Data Reviewed: I have personally reviewed following labs and imaging studies  CBC Lab Results  Component Value Date   WBC 19.9 (H) 05/09/2022   RBC 3.40 (L) 05/09/2022   HGB 9.4 (L) 05/09/2022   HCT 29.2  (L) 05/09/2022   MCV 85.9 05/09/2022   MCH 27.6 05/09/2022   PLT 253 05/09/2022   MCHC 32.2 05/09/2022   RDW 16.9 (H) 05/09/2022   LYMPHSABS 1.8 05/07/2022   MONOABS 1.6 (H) 05/07/2022   EOSABS 1.1 (H) 05/07/2022   BASOSABS 0.1 96/75/9163     Last metabolic panel Lab Results  Component Value Date   NA 139 05/09/2022   K 3.1 (L) 05/09/2022   CL 101 05/09/2022   CO2 25 05/09/2022   BUN 28 (H) 05/09/2022   CREATININE 1.36 (H) 05/09/2022   GLUCOSE 125 (H) 05/09/2022   GFRNONAA 40 (L) 05/09/2022   GFRAA 43 (L) 08/07/2020   CALCIUM 8.9 05/09/2022   PHOS 5.1 (H) 05/07/2022   PROT 6.3 (L) 04/22/2022   ALBUMIN 2.4 (L) 05/07/2022   LABGLOB 2.7 01/02/2022   AGRATIO 1.5 01/02/2022   BILITOT 0.7 04/22/2022   ALKPHOS 78 04/22/2022   AST 39 04/22/2022   ALT 40 04/22/2022   ANIONGAP 13 05/09/2022    GFR: Estimated Creatinine Clearance: 29.4 mL/min (A) (by C-G formula based on SCr of 1.36 mg/dL (H)).  No results found for this or any previous visit (from the past 240 hour(s)).    Radiology Studies: No results found.    LOS: 55 days    Cordelia Poche, MD [facility] 05/09/2022, 4:39 PM   If 7PM-7AM, please contact night-coverage [email or web address]

## 2022-05-09 NOTE — Progress Notes (Signed)
Nutrition Follow-up  DOCUMENTATION CODES:   Obesity unspecified  INTERVENTION:   Encourage PO intake   Continue:  Magic cup with all meals  Thickened milk and juice with all meals   NUTRITION DIAGNOSIS:   Inadequate oral intake related to dysphagia as evidenced by  (altered texture diet; meal completion <75%). Progressing.   GOAL:   Patient will meet greater than or equal to 90% of their needs Progressing.   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  Pt continues to make progress with therapy. PO intake recorded ant >70% of all meals   Medications reviewed and include: lasix every other day, MVI with minerals, protonix, senokot   Labs reviewed K 3.1  Diet Order:   Diet Order             DIET DYS 2 Room service appropriate? Yes; Fluid consistency: Honey Thick  Diet effective now                   EDUCATION NEEDS:   Not appropriate for education at this time  Skin:  Skin Assessment: Skin Integrity Issues: Skin Integrity Issues:: Stage II Stage II: buttocks  Last BM:  8/14 medium, BM regimen meds held today  Height:   Ht Readings from Last 1 Encounters:  03/15/22 '4\' 9"'$  (1.448 m)    Weight:   Wt Readings from Last 1 Encounters:  05/06/22 74.7 kg    BMI:  Body mass index is 35.64 kg/m.  Estimated Nutritional Needs:   Kcal:  1500-1700  Protein:  85-100 grams  Fluid:  >1.5 L/day  Lockie Pares., RD, LDN, CNSC See AMiON for contact information

## 2022-05-09 NOTE — Progress Notes (Signed)
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 s/p tumor resection 7/3. Recent DC from [facility] 6/22. MRI on 7/26 shows: SDH extending inferiorly along the clivus and  narrowing the foramen magnum with possible mass effect. PMH: CKD stage 3, HFpEF, a fib, meningioma.   OT comments  Pt progressing slow but steady towards acute OT goals. More alert this session and able to initiate more attempts at visual tracking. Positively identified names of 2 visitors present in the room. Pt seen sitting up in recliner, tried to work on some trunk flexion and bringing back off of chair for pressure relief and pregait/transfer activity. Grimacing with trunk flexion, abdomen feels tight. D/c recommendations remain appropriate.    Recommendations for follow up therapy are one component of a multi-disciplinary discharge planning process, led by the attending physician.  Recommendations may be updated based on patient status, additional functional criteria and insurance authorization.    Follow Up Recommendations  Skilled nursing-short term rehab (<3 hours/day)    Assistance Recommended at Discharge Frequent or constant Supervision/Assistance  Patient can return home with the following  Two people to help with walking and/or transfers;Two people to help with bathing/dressing/bathroom;Assistance with cooking/housework;Assistance with feeding;Direct supervision/assist for medications management;Direct supervision/assist for financial management;Assist for transportation;Help with stairs or ramp for entrance   Equipment Recommendations  Other (comment) (defer to next venue)    Recommendations for Other Services      Precautions / Restrictions Precautions Precautions: Fall Precaution Comments: seizures Restrictions Weight Bearing  Restrictions: No       Mobility Bed Mobility               General bed mobility comments: Pt seen at recliner level    Transfers                   General transfer comment: worked on trunk forward flexion, attempted to work on coming out of supported sitting a bit. Pt seen sitting up in chair.     Balance Overall balance assessment: Needs assistance Sitting-balance support: Feet supported Sitting balance-Leahy Scale: Poor                                     ADL either performed or assessed with clinical judgement   ADL Overall ADL's : Needs assistance/impaired     Grooming: Oral care;Maximal assistance;Total assistance Grooming Details (indicate cue type and reason): more alert during today's attempt at hand over hand. Pt initiating RUE arm movement today. Assist to stabilized at R elbow and to guide movements to access mouth. Provided builtup material for oral care stick (rolled washcloth).                               General ADL Comments: Pt seen in recliner. Attempted some trunk forward flexion incorporating cues to reach out for arm rest. Trialed on both left and right sides. Pt appeared to have increased discomfort, abdomen appears tight.    Extremity/Trunk Assessment Upper Extremity Assessment Upper Extremity Assessment: Generalized weakness;RUE deficits/detail;LUE deficits/detail RUE Deficits / Details: pt moving arms on her own more today then previous OT sessions. Tendency towrds flexion synergy pattern. RUE Coordination: decreased fine motor;decreased gross motor LUE Deficits / Details:  pt moving arms on her own more today then previous OT sessions. Tendency towrds flexion synergy pattern. LUE Coordination: decreased fine motor;decreased gross motor   Lower Extremity Assessment Lower Extremity Assessment: Defer to PT evaluation        Vision       Perception     Praxis      Cognition Arousal/Alertness:  Awake/alert Behavior During Therapy: Flat affect Overall Cognitive Status: Difficult to assess Area of Impairment: Attention, Following commands, Safety/judgement, Problem solving, Orientation, Memory                   Current Attention Level: Focused Memory: Decreased short-term memory, Decreased recall of precautions Following Commands: Follows one step commands inconsistently, Follows one step commands with increased time Safety/Judgement: Decreased awareness of deficits, Decreased awareness of safety Awareness: Intellectual Problem Solving: Slow processing, Decreased initiation, Difficulty sequencing, Requires verbal cues, Requires tactile cues General Comments: more alert today. eyes remaining open more than closed this session. Able to correctly state 2 vistors names with extra time and multimodal cueing        Exercises      Shoulder Instructions       General Comments Multiple family members present    Pertinent Vitals/ Pain       Pain Assessment Pain Assessment: No/denies pain Pain Location: pt reponds "no"  Home Living                                          Prior Functioning/Environment              Frequency  Min 2X/week        Progress Toward Goals  OT Goals(current goals can now be found in the care plan section)  Progress towards OT goals: Progressing toward goals  Acute Rehab OT Goals Patient Stated Goal: unable to state OT Goal Formulation: Patient unable to participate in goal setting Time For Goal Achievement: 05/16/22 Potential to Achieve Goals: Fair ADL Goals Pt Will Perform Grooming: with max assist;sitting Pt Will Perform Upper Body Bathing: with max assist;sitting Pt Will Perform Lower Body Bathing: with max assist;bed level Pt Will Perform Lower Body Dressing: with modified independence;sitting/lateral leans;sit to/from stand Pt Will Transfer to Toilet: with max assist;with +2 assist;bedside commode Pt  Will Perform Toileting - Clothing Manipulation and hygiene: with modified independence;sitting/lateral leans;sit to/from stand Additional ADL Goal #1: Pt will track to midline to find family members with moderate directional cues and max assist to turn head  Plan Discharge plan remains appropriate;Frequency remains appropriate    Co-evaluation                 AM-PAC OT "6 Clicks" Daily Activity     Outcome Measure   Help from another person eating meals?: Total Help from another person taking care of personal grooming?: Total Help from another person toileting, which includes using toliet, bedpan, or urinal?: Total Help from another person bathing (including washing, rinsing, drying)?: Total Help from another person to put on and taking off regular upper body clothing?: Total Help from another person to put on and taking off regular lower body clothing?: Total 6 Click Score: 6    End of Session Equipment Utilized During Treatment: Oxygen  OT Visit Diagnosis: Unsteadiness on feet (R26.81);Other abnormalities of gait and mobility (R26.89);Muscle weakness (generalized) (M62.81);Low vision, both eyes (H54.2);Other symptoms and signs involving cognitive  function;Pain   Activity Tolerance Patient limited by fatigue   Patient Left in chair;with call bell/phone within reach;with chair alarm set;with family/visitor present   Nurse Communication          Time: 1106-1140 OT Time Calculation (min): 34 min  Charges: OT General Charges $OT Visit: 1 Visit OT Treatments $Therapeutic Activity: 23-37 mins  Tyrone Schimke, OT Acute Rehabilitation Services Office: [phone]   Hortencia Pilar 05/09/2022, 2:16 PM

## 2022-05-10 ENCOUNTER — Inpatient Hospital Stay (HOSPITAL_COMMUNITY): Payer: Medicare Other

## 2022-05-10 DIAGNOSIS — D62 Acute posthemorrhagic anemia: Secondary | ICD-10-CM | POA: Diagnosis not present

## 2022-05-10 DIAGNOSIS — R569 Unspecified convulsions: Secondary | ICD-10-CM | POA: Diagnosis not present

## 2022-05-10 DIAGNOSIS — J9601 Acute respiratory failure with hypoxia: Secondary | ICD-10-CM | POA: Diagnosis not present

## 2022-05-10 DIAGNOSIS — I5043 Acute on chronic combined systolic (congestive) and diastolic (congestive) heart failure: Secondary | ICD-10-CM | POA: Diagnosis not present

## 2022-05-10 LAB — BASIC METABOLIC PANEL
Anion gap: 11 (ref 5–15)
BUN: 28 mg/dL — ABNORMAL HIGH (ref 8–23)
CO2: 23 mmol/L (ref 22–32)
Calcium: 8.9 mg/dL (ref 8.9–10.3)
Chloride: 106 mmol/L (ref 98–111)
Creatinine, Ser: 1.16 mg/dL — ABNORMAL HIGH (ref 0.44–1.00)
GFR, Estimated: 49 mL/min — ABNORMAL LOW (ref >60)
Glucose, Bld: 124 mg/dL — ABNORMAL HIGH (ref 70–99)
Potassium: 3.7 mmol/L (ref 3.5–5.1)
Sodium: 140 mmol/L (ref 135–145)

## 2022-05-10 LAB — CBC WITH DIFFERENTIAL/PLATELET
Abs Immature Granulocytes: 0.46 10*3/uL — ABNORMAL HIGH (ref 0.00–0.07)
Basophils Absolute: 0.1 10*3/uL (ref 0.0–0.1)
Basophils Relative: 1 %
Eosinophils Absolute: 0.8 10*3/uL — ABNORMAL HIGH (ref 0.0–0.5)
Eosinophils Relative: 4 %
HCT: 28 % — ABNORMAL LOW (ref 36.0–46.0)
Hemoglobin: 9.1 g/dL — ABNORMAL LOW (ref 12.0–15.0)
Immature Granulocytes: 2 %
Lymphocytes Relative: 10 %
Lymphs Abs: 2.1 10*3/uL (ref 0.7–4.0)
MCH: 27.7 pg (ref 26.0–34.0)
MCHC: 32.5 g/dL (ref 30.0–36.0)
MCV: 85.1 fL (ref 80.0–100.0)
Monocytes Absolute: 1.8 10*3/uL — ABNORMAL HIGH (ref 0.1–1.0)
Monocytes Relative: 9 %
Neutro Abs: 15.4 10*3/uL — ABNORMAL HIGH (ref 1.7–7.7)
Neutrophils Relative %: 74 %
Platelets: 270 10*3/uL (ref 150–400)
RBC: 3.29 MIL/uL — ABNORMAL LOW (ref 3.87–5.11)
RDW: 16.9 % — ABNORMAL HIGH (ref 11.5–15.5)
WBC: 20.7 10*3/uL — ABNORMAL HIGH (ref 4.0–10.5)
nRBC: 0 % (ref 0.0–0.2)

## 2022-05-10 NOTE — Progress Notes (Signed)
PROGRESS NOTE    Tara [name]  YYT:[id number] DOB: November 30, 1945 DOA: 03/15/2022 PCP: [name], MD   Brief Narrative: 76 year old African-American female PMH of anxiety, depression, type II DM, HLD, PAF, on Eliquis, CKD 3B, breast cancer SP chemoradiation presented to hospital on 6/24 with new onset seizures. Known history of sphenoidal meningioma that was noted to be increased in size on the CT scan with surrounding edema so she is transferred from [facility] for further evaluation.  She underwent LTM with no further seizures and was evaluated by neurosurgery and underwent bifrontal craniotomy for meningioma resection on 03/24/2020 and was transferred to the intensive care unit postoperatively with PCCM consulting while she was in ICU. 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  Treated with Unasyn for 5 days. 7/11 recurrent sz like activity. Not correlated on EEG. CT unchanged.  7/13 neurology signed off. 7/19 started on ABX for sepsis then transition to Unasyn. 7/21 palliative care was consulted. 7/23 CT chest and abdomen was performed negative for PE or significant pneumonia, without any hemorrhage 7/25 MRI brain without contrast shows increasing mass effect, SDH as well as subacute/acute small scattered infarct. 7/26 tube feeds resumed, neurology reconsulted.  Eliquis resumed per neurosurgery 7/27 oxygenation improving, mentation improving, neurology following.  CTA head and neck without any LVO.  Lower extremity Doppler without any DVT.  Echo shows new systolic dysfunction with global hypokinesis EF 40 to 45% and significant RV failure. 7/30 had another aspiration event. Tube feeds on hold. Now on NRB. Remains full code. No  tracheostomy per husband. Per Neurosurgery, give at least two weeks if not more to see if she improves.  8/1 oxygenation improving again to 5 L.  Trickle tube feeds started. 8/9 on D1 honey thick liquid diet.  Calorie count initiated. 8/13 oxygenation improving to 2 L. 8/14 core track removed nocturnal tube feeds stopped. 8/15 leukocytosis worsening, abdomen distended, x-ray abdomen shows evidence of possible sigmoid volvulus, GI consulted, CT abdomen negative for any volvulus and only shows redundant sigmoid colon.  Will resume diet.   Assessment and Plan: * Seizure ([place]) -Continue Keppra  Meningioma, cerebral ([place]) Per neurosurgery  Leukocytosis Recurrent. Know specific infectious source identified although patient has been recently treated for recurrent aspiration pneumonia. No current signs or symptoms for infection. -CBC in AM; may need to obtain culture data to rule out occult infection  Hypertension - Continue  Anxiety and depression Previously on Zoloft and Klonopin which are discontinued.  GERD -Continue Protonix  Hyperlipidemia -Continue Crestor  Dysphagia Previously with a Cortrak and tube feedings. Evaluated/followed by speech therapy. -Speech therapy recommendations (8/15): Diet recommendations: Dysphagia 2 (fine chop);Honey-thick liquid Liquids provided via: Cup;Straw Medication Administration: Crushed with puree Supervision: Full supervision/cueing for compensatory strategies;Staff to assist with self feeding Compensations: Slow rate;Small sips/bites;Multiple dry swallows after each bite/sip;Clear throat intermittently Postural Changes and/or Swallow Maneuvers: Seated upright 90 degrees  Acute postoperative anemia due to expected blood loss Acute anemia down to 6.5 requiring 2 units of PRBC. Hemoglobin currently stable.  Dysfunction of right cardiac ventricle Acute/chronic PE ruled out. Possibly related to pneumonia. Recommendation for repeat Transthoracic  Echocardiogram in 6 months. Patient can follow-up with pulmonology.  Acute respiratory failure with hypoxia (HCC) Secondary to pneumonia with associated tachypnea. Hypoxia  improved. -Wean to room air as able. -Repeat chest x-ray in AM  Atrial fibrillation, chronic (HCC) Patient is on rate/rhythm control and on anticoagulation. -Continue amiodarone and Eliquis  AKI (acute kidney injury) ([place]) Possibly secondary to lasix. Weight appears to have downtrended from admission but is stable for the past week. -Repeat BMP in AM  Acute on chronic combined systolic and diastolic CHF (congestive heart failure) ([place]) Most recent Transthoracic Echocardiogram with LVEF of 40-45% (down from 55%) with global hypokinesis and associated RV dysfunction noted. Patient with acute heart failure this admission that responded to Lasix. Currently resolved. Not on GDMT secondary to recent hypotension. -Continue Lasix  Hypernatremia-resolved as of 05/07/2022 Resolved.  Thrombocytopenia (HCC)-resolved as of 05/07/2022 Resolved.    DVT prophylaxis: Eliquis Code Status:   Code Status: Full Code Family Communication: Sisters and neice at bedside Disposition Plan: Per primary    Procedures:    Antimicrobials:     Subjective: Patient reports no concerns today  Objective: BP (!) 111/58 (BP Location: Left Leg)   Pulse 90   Temp 99 F (37.2 C) (Axillary)   Resp 15   Ht '4\' 9"'$  (1.448 m)   Wt 74.7 kg   SpO2 95%   BMI 35.64 kg/m   Examination:  General exam: Appears calm and comfortable. Sitting upright in bed Respiratory system: Clear to auscultation. Respiratory effort normal. Cardiovascular system: S1 & S2 heard, RRR. Gastrointestinal system: Abdomen is distended, soft and non-tender. Normal bowel sounds heard. Central nervous system: Alert. Lip smacking. Musculoskeletal: No calf tenderness   Data Reviewed: I have personally reviewed following labs and imaging studies  CBC Lab Results   Component Value Date   WBC 20.7 (H) 05/10/2022   RBC 3.29 (L) 05/10/2022   HGB 9.1 (L) 05/10/2022   HCT 28.0 (L) 05/10/2022   MCV 85.1 05/10/2022   MCH 27.7 05/10/2022   PLT 270 05/10/2022   MCHC 32.5 05/10/2022   RDW 16.9 (H) 05/10/2022   LYMPHSABS 2.1 05/10/2022   MONOABS 1.8 (H) 05/10/2022   EOSABS 0.8 (H) 05/10/2022   BASOSABS 0.1 40/98/1191     Last metabolic panel Lab Results  Component Value Date   NA 140 05/10/2022   K 3.7 05/10/2022   CL 106 05/10/2022   CO2 23 05/10/2022   BUN 28 (H) 05/10/2022   CREATININE 1.16 (H) 05/10/2022   GLUCOSE 124 (H) 05/10/2022   GFRNONAA 49 (L) 05/10/2022   GFRAA 43 (L) 08/07/2020   CALCIUM 8.9 05/10/2022   PHOS 5.1 (H) 05/07/2022   PROT 6.3 (L) 04/22/2022   ALBUMIN 2.4 (L) 05/07/2022   LABGLOB 2.7 01/02/2022   AGRATIO 1.5 01/02/2022   BILITOT 0.7 04/22/2022   ALKPHOS 78 04/22/2022   AST 39 04/22/2022   ALT 40 04/22/2022   ANIONGAP 11 05/10/2022    GFR: Estimated Creatinine Clearance: 34.5 mL/min (A) (by C-G formula based on SCr of 1.16 mg/dL (H)).  No results found for this or any previous visit (from the past 240 hour(s)).    Radiology Studies: DG CHEST PORT 1 VIEW  Result Date: 05/10/2022 CLINICAL DATA:  Hypoxia EXAM: PORTABLE CHEST 1 VIEW COMPARISON:  04/29/2022 FINDINGS: Left perihilar interstitial thickening. No focal consolidation. No pleural effusion or pneumothorax. Stable cardiomegaly. Calcified right paratracheal and hilar lymph nodes. No acute osseous abnormality. IMPRESSION: 1. Left perihilar interstitial thickening likely reflecting mild interstitial edema versus pneumonia. Electronically Signed   By: Kathreen Devoid M.D.   On: 05/10/2022 10:27      LOS: 56  days    Cordelia Poche, MD [facility] 05/10/2022, 4:12 PM   If 7PM-7AM, please contact night-coverage [email or web address]

## 2022-05-10 NOTE — Progress Notes (Signed)
Subjective: Patient reports eating breakfast with assistance from husband. No acute events overnight.   Objective: Vital signs in last 24 hours: Temp:  [97.6 F (36.4 C)-98.6 F (37 C)] 98.4 F (36.9 C) (08/19 1048) Pulse Rate:  [87-107] 94 (08/19 1048) Resp:  [18-21] 20 (08/19 1048) BP: (104-138)/(59-94) 129/94 (08/19 1048) SpO2:  [90 %-100 %] 95 % (08/19 1048)  Intake/Output from previous day: 08/18 0701 - 08/19 0700 In: 180 [P.O.:180] Out: 700 [Urine:700] Intake/Output this shift: No intake/output data recorded.  Physical Exam: Patient is awake and alert. Eyes open spontaneously. Speech present but not fluent. They are in NAD and VSS. MAEW. PERLA, EOMI. CNs grossly intact. Incision is well approximated with no drainage, erythema, or edema.   Lab Results: Recent Labs    05/09/22 0230 05/10/22 0117  WBC 19.9* 20.7*  HGB 9.4* 9.1*  HCT 29.2* 28.0*  PLT 253 270   BMET Recent Labs    05/09/22 0230 05/10/22 0117  NA 139 140  K 3.1* 3.7  CL 101 106  CO2 25 23  GLUCOSE 125* 124*  BUN 28* 28*  CREATININE 1.36* 1.16*  CALCIUM 8.9 8.9    Studies/Results: DG CHEST PORT 1 VIEW  Result Date: 05/10/2022 CLINICAL DATA:  Hypoxia EXAM: PORTABLE CHEST 1 VIEW COMPARISON:  04/29/2022 FINDINGS: Left perihilar interstitial thickening. No focal consolidation. No pleural effusion or pneumothorax. Stable cardiomegaly. Calcified right paratracheal and hilar lymph nodes. No acute osseous abnormality. IMPRESSION: 1. Left perihilar interstitial thickening likely reflecting mild interstitial edema versus pneumonia. Electronically Signed   By: Kathreen Devoid M.D.   On: 05/10/2022 10:27    Assessment/Plan: Patient is doing well. Neuro exam is stable. Awaiting SNF placement. Continue PT/OT.   LOS: 56 days     Marvis Moeller, DNP, AGNP-C Neurosurgery Nurse Practitioner  [facility] [place] [address], [place], [place] [address] P: [phone]     F: [phone]  05/10/2022 11:54 AM

## 2022-05-11 DIAGNOSIS — D62 Acute posthemorrhagic anemia: Secondary | ICD-10-CM | POA: Diagnosis not present

## 2022-05-11 DIAGNOSIS — J9601 Acute respiratory failure with hypoxia: Secondary | ICD-10-CM | POA: Diagnosis not present

## 2022-05-11 DIAGNOSIS — R569 Unspecified convulsions: Secondary | ICD-10-CM | POA: Diagnosis not present

## 2022-05-11 DIAGNOSIS — I5043 Acute on chronic combined systolic (congestive) and diastolic (congestive) heart failure: Secondary | ICD-10-CM | POA: Diagnosis not present

## 2022-05-11 LAB — CBC
HCT: 28.6 % — ABNORMAL LOW (ref 36.0–46.0)
Hemoglobin: 9.1 g/dL — ABNORMAL LOW (ref 12.0–15.0)
MCH: 27.7 pg (ref 26.0–34.0)
MCHC: 31.8 g/dL (ref 30.0–36.0)
MCV: 87.2 fL (ref 80.0–100.0)
Platelets: 243 10*3/uL (ref 150–400)
RBC: 3.28 MIL/uL — ABNORMAL LOW (ref 3.87–5.11)
RDW: 17.2 % — ABNORMAL HIGH (ref 11.5–15.5)
WBC: 19.7 10*3/uL — ABNORMAL HIGH (ref 4.0–10.5)
nRBC: 0.1 % (ref 0.0–0.2)

## 2022-05-11 NOTE — Progress Notes (Signed)
Subjective: Patient reports sitting up in bed eating breakfast with assistance from her husband. Husband reports that patient has made good improvement over the last two days and is doing well with therapies. Husband is concerned about contractures in bilateral hands. NAE ON.   Objective: Vital signs in last 24 hours: Temp:  [97.7 F (36.5 C)-99 F (37.2 C)] 97.7 F (36.5 C) (08/20 0754) Pulse Rate:  [84-94] 84 (08/20 0754) Resp:  [15-22] 19 (08/20 0754) BP: (111-149)/(57-94) 115/57 (08/20 0754) SpO2:  [93 %-97 %] 95 % (08/20 0754)  Intake/Output from previous day: 08/19 0701 - 08/20 0700 In: -  Out: 200 [Urine:200] Intake/Output this shift: No intake/output data recorded.  Physical Exam: Patient is awake and alert. Eyes open spontaneously. Speech present but not fluent. They are in NAD with stable vital signs. MAEW. PERLA, EOMI. CNs grossly intact. Incision is well approximated with no drainage, erythema, or edema.    Lab Results: Recent Labs    05/10/22 0117 05/11/22 0336  WBC 20.7* 19.7*  HGB 9.1* 9.1*  HCT 28.0* 28.6*  PLT 270 243   BMET Recent Labs    05/09/22 0230 05/10/22 0117  NA 139 140  K 3.1* 3.7  CL 101 106  CO2 25 23  GLUCOSE 125* 124*  BUN 28* 28*  CREATININE 1.36* 1.16*  CALCIUM 8.9 8.9    Studies/Results: DG CHEST PORT 1 VIEW  Result Date: 05/10/2022 CLINICAL DATA:  Hypoxia EXAM: PORTABLE CHEST 1 VIEW COMPARISON:  04/29/2022 FINDINGS: Left perihilar interstitial thickening. No focal consolidation. No pleural effusion or pneumothorax. Stable cardiomegaly. Calcified right paratracheal and hilar lymph nodes. No acute osseous abnormality. IMPRESSION: 1. Left perihilar interstitial thickening likely reflecting mild interstitial edema versus pneumonia. Electronically Signed   By: Kathreen Devoid M.D.   On: 05/10/2022 10:27    Assessment/Plan: Patient is continuing to do well and make progress. Neuro exam is stable. Awaiting SNF placement. Continue  PT/OT.  LOS: 28 days   Marvis Moeller, DNP, AGNP-C Neurosurgery Nurse Practitioner  [facility] [place] [address], [address], [place], [place] [address] P: [phone]    F: [phone]  05/11/2022 8:22 AM

## 2022-05-11 NOTE — Progress Notes (Signed)
Tara [name]  OIZ:[id number] DOB: [birth year] DOA: 03/15/2022 PCP: [name], MD   Brief Narrative: 76 year old African-American female PMH of anxiety, depression, type II DM, HLD, PAF, on Eliquis, CKD 3B, breast cancer SP chemoradiation presented to hospital on 6/24 with new onset seizures. Known history of sphenoidal meningioma that was noted to be increased in size on the CT scan with surrounding edema so she is transferred from [facility] for further evaluation.  She underwent LTM with no further seizures and was evaluated by neurosurgery and underwent bifrontal craniotomy for meningioma resection on 03/24/2020 and was transferred to the intensive care unit postoperatively with PCCM consulting while she was in ICU. 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  Treated with Unasyn for 5 days. 7/11 recurrent sz like activity. Not correlated on EEG. CT unchanged.  7/13 neurology signed off. 7/19 started on ABX for sepsis then transition to Unasyn. 7/21 palliative care was consulted. 7/23 CT chest and abdomen was performed negative for PE or significant pneumonia, without any hemorrhage 7/25 MRI brain without contrast shows increasing mass effect, SDH as well as subacute/acute small scattered infarct. 7/26 tube feeds resumed, neurology reconsulted.  Eliquis resumed per neurosurgery 7/27 oxygenation improving, mentation improving, neurology following.  CTA head and neck without any LVO.  Lower extremity Doppler without any DVT.  Echo shows new systolic dysfunction with global hypokinesis EF 40 to 45% and significant RV failure. 7/30 had another aspiration event. Tube feeds on hold. Now on NRB. Remains full code. No  tracheostomy per husband. Per Neurosurgery, give at least two weeks if not more to see if she improves.  8/1 oxygenation improving again to 5 L.  Trickle tube feeds started. 8/9 on D1 honey thick liquid diet.  Calorie count initiated. 8/13 oxygenation improving to 2 L. 8/14 core track removed nocturnal tube feeds stopped. 8/15 leukocytosis worsening, abdomen distended, x-ray abdomen shows evidence of possible sigmoid volvulus, GI consulted, CT abdomen negative for any volvulus and only shows redundant sigmoid colon.  Will resume diet.   Assessment and Plan: * Seizure ([place]) -Continue Keppra  Meningioma, cerebral ([place]) Per neurosurgery  Leukocytosis Recurrent. Know specific infectious source identified although patient has been recently treated for recurrent aspiration pneumonia. No current signs or symptoms for infection. WBC appears to have possibly stabilized. -CBC in AM  Hypertension - Continue  Anxiety and depression Previously on Zoloft and Klonopin which are discontinued.  GERD -Continue Protonix  Hyperlipidemia -Continue Crestor  Dysphagia Previously with a Cortrak and tube feedings. Evaluated/followed by speech therapy. -Speech therapy recommendations (8/15): Diet recommendations: Dysphagia 2 (fine chop);Honey-thick liquid Liquids provided via: Cup;Straw Medication Administration: Crushed with puree Supervision: Full supervision/cueing for compensatory strategies;Staff to assist with self feeding Compensations: Slow rate;Small sips/bites;Multiple dry swallows after each bite/sip;Clear throat intermittently Postural Changes and/or Swallow Maneuvers: Seated upright 90 degrees  Acute postoperative anemia due to expected blood loss Acute anemia down to 6.5 requiring 2 units of PRBC. Hemoglobin currently stable.  Dysfunction of right cardiac ventricle Acute/chronic PE ruled out. Possibly related to pneumonia. Recommendation for repeat Transthoracic Echocardiogram in 6  months. Patient can follow-up with pulmonology.  Acute respiratory failure with hypoxia (HCC) Secondary to pneumonia with associated tachypnea. Hypoxia improved but not resolved. Repeat  chest x-ray with edema vs pneumonia. Patient with leukocytosis and no other infectious symptoms. -Will treat with Lasix to see if she continues to improve -If any clinical symptoms of pneumonia, will start empiric antibiotic treatment  Atrial fibrillation, chronic ([place]) Patient is on rate/rhythm control and on anticoagulation. -Continue amiodarone and Eliquis  AKI (acute kidney injury) ([place]) Possibly secondary to lasix although now has stabilized. Weight appears to have downtrended from admission but is stable for the past week.  Acute on chronic combined systolic and diastolic CHF (congestive heart failure) (HCC) Most recent Transthoracic Echocardiogram with LVEF of 40-45% (down from 55%) with global hypokinesis and associated RV dysfunction noted. Patient with acute heart failure this admission that responded to Lasix. Currently resolved. Not on GDMT secondary to recent hypotension. -Continue Lasix  Hypernatremia-resolved as of 05/07/2022 Resolved.  Thrombocytopenia (HCC)-resolved as of 05/07/2022 Resolved.    DVT prophylaxis: Eliquis Code Status:   Code Status: Full Code Family Communication: Sisters and neice at bedside Disposition Plan: Per primary    Procedures:    Antimicrobials:     Subjective: No concerns from patient or husband.  Objective: BP (!) 115/57 (BP Location: Left Leg)   Pulse 84   Temp 97.7 F (36.5 C) (Axillary)   Resp 19   Ht '4\' 9"'$  (1.448 m)   Wt 74.7 kg   SpO2 94%   BMI 35.64 kg/m   Examination:  General exam: Appears calm and comfortable Respiratory system: Clear to auscultation but diminished. Respiratory effort normal. Cardiovascular system: S1 & S2 heard, RRR. No murmurs, rubs, gallops or clicks. Gastrointestinal system: Abdomen is nondistended, soft  and nontender. Normal bowel sounds heard. Central nervous system: Alert. Musculoskeletal: No calf tenderness   Data Reviewed: I have personally reviewed following labs and imaging studies  CBC Lab Results  Component Value Date   WBC 19.7 (H) 05/11/2022   RBC 3.28 (L) 05/11/2022   HGB 9.1 (L) 05/11/2022   HCT 28.6 (L) 05/11/2022   MCV 87.2 05/11/2022   MCH 27.7 05/11/2022   PLT 243 05/11/2022   MCHC 31.8 05/11/2022   RDW 17.2 (H) 05/11/2022   LYMPHSABS 2.1 05/10/2022   MONOABS 1.8 (H) 05/10/2022   EOSABS 0.8 (H) 05/10/2022   BASOSABS 0.1 09/47/0962     Last metabolic panel Lab Results  Component Value Date   NA 140 05/10/2022   K 3.7 05/10/2022   CL 106 05/10/2022   CO2 23 05/10/2022   BUN 28 (H) 05/10/2022   CREATININE 1.16 (H) 05/10/2022   GLUCOSE 124 (H) 05/10/2022   GFRNONAA 49 (L) 05/10/2022   GFRAA 43 (L) 08/07/2020   CALCIUM 8.9 05/10/2022   PHOS 5.1 (H) 05/07/2022   PROT 6.3 (L) 04/22/2022   ALBUMIN 2.4 (L) 05/07/2022   LABGLOB 2.7 01/02/2022   AGRATIO 1.5 01/02/2022   BILITOT 0.7 04/22/2022   ALKPHOS 78 04/22/2022   AST 39 04/22/2022   ALT 40 04/22/2022   ANIONGAP 11 05/10/2022    GFR: Estimated Creatinine Clearance: 34.5 mL/min (A) (by C-G formula based on SCr of 1.16 mg/dL (H)).  No results found for this or any previous visit (from the past 240 hour(s)).    Radiology Studies: DG CHEST PORT 1 VIEW  Result Date: 05/10/2022 CLINICAL DATA:  Hypoxia EXAM: PORTABLE CHEST 1 VIEW COMPARISON:  04/29/2022 FINDINGS: Left perihilar interstitial thickening. No focal consolidation. No pleural effusion or pneumothorax. Stable cardiomegaly. Calcified right paratracheal and hilar lymph nodes. No acute osseous abnormality. IMPRESSION: 1. Left perihilar interstitial thickening likely  reflecting mild interstitial edema versus pneumonia. Electronically Signed   By: Kathreen Devoid M.D.   On: 05/10/2022 10:27      LOS: 57 days    Cordelia Poche, MD [facility] 05/11/2022, 10:08 AM   If 7PM-7AM, please contact night-coverage [email or web address]

## 2022-05-12 DIAGNOSIS — J9601 Acute respiratory failure with hypoxia: Secondary | ICD-10-CM | POA: Diagnosis not present

## 2022-05-12 DIAGNOSIS — D62 Acute posthemorrhagic anemia: Secondary | ICD-10-CM | POA: Diagnosis not present

## 2022-05-12 DIAGNOSIS — R569 Unspecified convulsions: Secondary | ICD-10-CM | POA: Diagnosis not present

## 2022-05-12 DIAGNOSIS — I5043 Acute on chronic combined systolic (congestive) and diastolic (congestive) heart failure: Secondary | ICD-10-CM | POA: Diagnosis not present

## 2022-05-12 LAB — CBC
HCT: 30.4 % — ABNORMAL LOW (ref 36.0–46.0)
Hemoglobin: 9.6 g/dL — ABNORMAL LOW (ref 12.0–15.0)
MCH: 27.4 pg (ref 26.0–34.0)
MCHC: 31.6 g/dL (ref 30.0–36.0)
MCV: 86.6 fL (ref 80.0–100.0)
Platelets: 256 10*3/uL (ref 150–400)
RBC: 3.51 MIL/uL — ABNORMAL LOW (ref 3.87–5.11)
RDW: 17.5 % — ABNORMAL HIGH (ref 11.5–15.5)
WBC: 19.4 10*3/uL — ABNORMAL HIGH (ref 4.0–10.5)
nRBC: 0 % (ref 0.0–0.2)

## 2022-05-12 LAB — BASIC METABOLIC PANEL
Anion gap: 6 (ref 5–15)
BUN: 23 mg/dL (ref 8–23)
CO2: 23 mmol/L (ref 22–32)
Calcium: 8.7 mg/dL — ABNORMAL LOW (ref 8.9–10.3)
Chloride: 111 mmol/L (ref 98–111)
Creatinine, Ser: 0.98 mg/dL (ref 0.44–1.00)
GFR, Estimated: 60 mL/min — ABNORMAL LOW (ref >60)
Glucose, Bld: 112 mg/dL — ABNORMAL HIGH (ref 70–99)
Potassium: 3.2 mmol/L — ABNORMAL LOW (ref 3.5–5.1)
Sodium: 140 mmol/L (ref 135–145)

## 2022-05-12 MED ORDER — POTASSIUM CHLORIDE CRYS ER 20 MEQ PO TBCR
40.0000 meq | EXTENDED_RELEASE_TABLET | ORAL | Status: AC
  Administered 2022-05-12 (×2): 40 meq via ORAL
  Filled 2022-05-12 (×2): qty 2

## 2022-05-12 NOTE — TOC Progression Note (Signed)
Transition of Care [facility]) - Progression Note    Patient Details  Name: Tara [name] MRN: [id number] Date of Birth: April 29, 1946  Transition of Care [facility]) CM/SW [place], [place] Phone Number: 05/12/2022, 10:50 AM  Clinical Narrative:     Patrick [place]- unable to offer - reports patient appears to be more LTC then STR.   Expected Discharge Plan: [place] Barriers to Discharge: Continued Medical Work up, Ship broker  Expected Discharge Plan and Services Expected Discharge Plan: [place]   Discharge Planning Services: CM Consult   Living arrangements for the past 2 months: [place]: [place]         Social Determinants of Health (SDOH) Interventions    Readmission Risk Interventions    03/06/2022   12:36 PM  Readmission Risk Prevention Plan  Transportation Screening Complete  HRI or Home Care Consult Complete  Social Work Consult for [place] Planning/Counseling Complete

## 2022-05-12 NOTE — Progress Notes (Signed)
PROGRESS NOTE    Tara [name]  NWG:[id number] DOB: 01/09/1946 DOA: 03/15/2022 PCP: [name], MD   Brief Narrative: 76 year old African-American female PMH of anxiety, depression, type II DM, HLD, PAF, on Eliquis, CKD 3B, breast cancer SP chemoradiation presented to hospital on 6/24 with new onset seizures. Known history of sphenoidal meningioma that was noted to be increased in size on the CT scan with surrounding edema so she is transferred from [facility] for further evaluation.  She underwent LTM with no further seizures and was evaluated by neurosurgery and underwent bifrontal craniotomy for meningioma resection on 03/24/2020 and was transferred to the intensive care unit postoperatively with PCCM consulting while she was in ICU. 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  Treated with Unasyn for 5 days. 7/11 recurrent sz like activity. Not correlated on EEG. CT unchanged.  7/13 neurology signed off. 7/19 started on ABX for sepsis then transition to Unasyn. 7/21 palliative care was consulted. 7/23 CT chest and abdomen was performed negative for PE or significant pneumonia, without any hemorrhage 7/25 MRI brain without contrast shows increasing mass effect, SDH as well as subacute/acute small scattered infarct. 7/26 tube feeds resumed, neurology reconsulted.  Eliquis resumed per neurosurgery 7/27 oxygenation improving, mentation improving, neurology following.  CTA head and neck without any LVO.  Lower extremity Doppler without any DVT.  Echo shows new systolic dysfunction with global hypokinesis EF 40 to 45% and significant RV failure. 7/30 had another aspiration event. Tube feeds on hold. Now on NRB. Remains full code. No  tracheostomy per husband. Per Neurosurgery, give at least two weeks if not more to see if she improves.  8/1 oxygenation improving again to 5 L.  Trickle tube feeds started. 8/9 on D1 honey thick liquid diet.  Calorie count initiated. 8/13 oxygenation improving to 2 L. 8/14 core track removed nocturnal tube feeds stopped. 8/15 leukocytosis worsening, abdomen distended, x-ray abdomen shows evidence of possible sigmoid volvulus, GI consulted, CT abdomen negative for any volvulus and only shows redundant sigmoid colon.  Will resume diet.   Assessment and Plan: * Seizure ([place]) -Continue Keppra  Meningioma, cerebral ([place]) Per neurosurgery  Leukocytosis Recurrent. Know specific infectious source identified although patient has been recently treated for recurrent aspiration pneumonia. No current signs or symptoms for infection. WBC appears to have stabilized. -CBC in AM  Hypertension - Continue  Anxiety and depression Previously on Zoloft and Klonopin which are discontinued.  GERD -Continue Protonix  Hyperlipidemia -Continue Crestor  Dysphagia Previously with a Cortrak and tube feedings. Evaluated/followed by speech therapy. -Speech therapy recommendations (8/15): Diet recommendations: Dysphagia 2 (fine chop);Honey-thick liquid Liquids provided via: Cup;Straw Medication Administration: Crushed with puree Supervision: Full supervision/cueing for compensatory strategies;Staff to assist with self feeding Compensations: Slow rate;Small sips/bites;Multiple dry swallows after each bite/sip;Clear throat intermittently Postural Changes and/or Swallow Maneuvers: Seated upright 90 degrees  Acute postoperative anemia due to expected blood loss Acute anemia down to 6.5 requiring 2 units of PRBC. Hemoglobin currently stable.  Dysfunction of right cardiac ventricle Acute/chronic PE ruled out. Possibly related to pneumonia. Recommendation for repeat Transthoracic Echocardiogram in 6 months.  Patient can follow-up with pulmonology.  Acute respiratory failure with hypoxia (HCC) Secondary to pneumonia with associated tachypnea. Hypoxia improved but not resolved. Repeat chest  x-ray with edema vs pneumonia. Patient with leukocytosis and no other infectious symptoms. -Will treat with Lasix to see if she continues to improve -If any clinical symptoms of pneumonia, will start empiric antibiotic treatment  Atrial fibrillation, chronic ([place]) Patient is on rate/rhythm control and on anticoagulation. -Continue amiodarone and Eliquis  AKI (acute kidney injury) ([place]) Possibly secondary to lasix although now has stabilized. Weight appears to have downtrended from admission but is stable for the past week.  Acute on chronic combined systolic and diastolic CHF (congestive heart failure) (HCC) Most recent Transthoracic Echocardiogram with LVEF of 40-45% (down from 55%) with global hypokinesis and associated RV dysfunction noted. Patient with acute heart failure this admission that responded to Lasix. Currently resolved. Not on GDMT secondary to recent hypotension. -Continue Lasix  Hypokalemia Potassium of 3.2 this morning on BMP. -Kdur 40 meq x2 -May need to schedule potassium supplementation with Lasix  Hypernatremia-resolved as of 05/07/2022 Resolved.  Thrombocytopenia (HCC)-resolved as of 05/07/2022 Resolved.    DVT prophylaxis: Eliquis Code Status:   Code Status: Full Code Family Communication: Sisters and neice at bedside Disposition Plan: Per primary    Procedures:    Antimicrobials:     Subjective: Patient's oxygen use increased to 6 L/min overnight per nursing. Unknown etiology. No specific symptoms although rales heard on exam. This morning, however, patient reports no dyspnea or chest pain. Breathing well. Husband has not noticed any breathing issues.  Objective: BP 113/70 (BP Location: Right Leg)   Pulse 95   Temp 98.9 F (37.2 C) (Oral)   Resp (!) 25   Ht 4'  9" (1.448 m)   Wt 74.7 kg   SpO2 93%   BMI 35.64 kg/m   Examination:  General exam: Appears calm and comfortable Respiratory system: Clear to auscultation. Respiratory effort normal. Cardiovascular system: S1 & S2 heard. Tachycardia. Normal rhythm. Gastrointestinal system: Abdomen is nondistended, soft and nontender. Decreased bowel sounds heard. Central nervous system: Alert. Constant lip smacking Musculoskeletal: No calf tenderness   Data Reviewed: I have personally reviewed following labs and imaging studies  CBC Lab Results  Component Value Date   WBC 19.4 (H) 05/12/2022   RBC 3.51 (L) 05/12/2022   HGB 9.6 (L) 05/12/2022   HCT 30.4 (L) 05/12/2022   MCV 86.6 05/12/2022   MCH 27.4 05/12/2022   PLT 256 05/12/2022   MCHC 31.6 05/12/2022   RDW 17.5 (H) 05/12/2022   LYMPHSABS 2.1 05/10/2022   MONOABS 1.8 (H) 05/10/2022   EOSABS 0.8 (H) 05/10/2022   BASOSABS 0.1 16/60/6301     Last metabolic panel Lab Results  Component Value Date   NA 140 05/12/2022   K 3.2 (L) 05/12/2022   CL 111 05/12/2022   CO2 23 05/12/2022   BUN 23 05/12/2022   CREATININE 0.98 05/12/2022   GLUCOSE 112 (H) 05/12/2022   GFRNONAA 60 (L) 05/12/2022   GFRAA 43 (L) 08/07/2020   CALCIUM 8.7 (L) 05/12/2022   PHOS 5.1 (H) 05/07/2022   PROT 6.3 (L) 04/22/2022   ALBUMIN 2.4 (L) 05/07/2022   LABGLOB 2.7 01/02/2022   AGRATIO 1.5 01/02/2022   BILITOT 0.7 04/22/2022   ALKPHOS 78 04/22/2022   AST 39 04/22/2022   ALT 40 04/22/2022   ANIONGAP 6 05/12/2022    GFR: Estimated Creatinine Clearance: 40.9 mL/min (by C-G formula based on SCr of 0.98 mg/dL).  No results found for this or any previous visit (from the past 240 hour(s)).    Radiology Studies: No results found.  LOS: 17 days    Cordelia Poche, MD [facility] 05/12/2022, 9:46 AM   If 7PM-7AM, please contact night-coverage [email or web address]

## 2022-05-12 NOTE — Progress Notes (Signed)
Physical Therapy Treatment Patient Details Name: Tara [name] MRN: [id number] DOB: 1945/09/27 Today's Date: 05/12/2022   History of Present Illness 76 yo woman admitted 03/15/22 with new onset seizures related to meningioma. s/p tumor resection 7/3. Recent DC from [facility] 6/22. MRI on 7/26 shows: SDH extending inferiorly along the clivus and  narrowing the foramen magnum with possible mass effect. PMH: CKD stage 3, HFpEF, a fib, meningioma.    PT Comments    Patient progressing slowly.  Attempted standing lift but lift non-operable.  She needed mod A for sitting EOB due to posterior lean and arms tight at her chest.  She was quicker with responses to questions (one word answers).  Once stretched out had hands on pillows and placed cloths rolled in her hands for maintaining positioning.  She continues to need +2 A for mobility, but feel her family could learn how to use a lift and how to help keep her clean at home if they were willing.  Feel follow up skilled PT at [facility] could help with transition to home helping to decreased burden of care and with caregiver education.  PT will continue to follow acutely.    Recommendations for follow up therapy are one component of a multi-disciplinary discharge planning process, led by the attending physician.  Recommendations may be updated based on patient status, additional functional criteria and insurance authorization.  Follow Up Recommendations  Skilled nursing-short term rehab (<3 hours/day) Can patient physically be transported by private vehicle: No   Assistance Recommended at Discharge Frequent or constant Supervision/Assistance  Patient can return home with the following Two people to help with walking and/or transfers;Two people to help with bathing/dressing/bathroom;Assist for transportation;Assistance with feeding;Assistance with Education officer, environmental (measurements PT);Wheelchair cushion (measurements  PT);Hospital bed;Other (comment);BSC/3in1 (hoyer lift)    Recommendations for Other Services       Precautions / Restrictions Precautions Precautions: Fall Precaution Comments: seizures     Mobility  Bed Mobility Overal bed mobility: Needs Assistance Bed Mobility: Rolling, Supine to Sit Rolling: Max assist   Supine to sit: Total assist, HOB elevated     General bed mobility comments: rolling for changing linen as NT in the room helping to clean after BM; up to sit EOB with assist for scooting hips, moving legs to EOB and lifting trunk    Transfers Overall transfer level: Needs assistance   Transfers: Bed to chair/wheelchair/BSC   Stand pivot transfers: Total assist         General transfer comment: Planned to use Clarise Cruz Plus to attempt standing at the bedside, but lift not operable so removed and assisted with squat pivot to drop arm recliner with extra time to scoot back in chair    Ambulation/Gait                   Stairs             Wheelchair Mobility    Modified Rankin (Stroke Patients Only) Modified Rankin (Stroke Patients Only) Pre-Morbid Rankin Score: Moderately severe disability Modified Rankin: Severe disability     Balance Overall balance assessment: Needs assistance   Sitting balance-Leahy Scale: Poor Sitting balance - Comments: mod A for balance sitting EOB     Standing balance-Leahy Scale: Zero Standing balance comment: unable to stand erect today                            Cognition Arousal/Alertness: Awake/alert  Behavior During Therapy: Flat affect Overall Cognitive Status: Impaired/Different from baseline Area of Impairment: Attention, Following commands, Problem solving                   Current Attention Level: Focused   Following Commands: Follows one step commands with increased time, Follows one step commands inconsistently     Problem Solving: Slow processing, Decreased initiation, Difficulty  sequencing, Requires verbal cues, Requires tactile cues General Comments: alert and communicating with less delay when questioned        Exercises General Exercises - Upper Extremity Shoulder Flexion: Both, 5 reps, Seated, PROM Elbow Flexion: PROM, Both, 5 reps, Seated Elbow Extension: PROM, Both, 5 reps, Seated Wrist Flexion: PROM, Both, 5 reps, Seated Wrist Extension: PROM, Both, 5 reps, Seated Digit Composite Flexion: PROM, Both, 5 reps, Seated Composite Extension: PROM, Both, 5 reps, Seated General Exercises - Lower Extremity Ankle Circles/Pumps: Both, 10 reps, Seated, PROM Heel Slides: PROM, Both, 10 reps, Seated Hip ABduction/ADduction: PROM, Both, 10 reps, Seated    General Comments General comments (skin integrity, edema, etc.): spouse present and encouraging her to talk for herself to insurance company on the phone      Pertinent Vitals/Pain Pain Assessment Pain Assessment: Faces Faces Pain Scale: Hurts little more Pain Location: grimacing to stretching heel cords and arms. Pain Descriptors / Indicators: Guarding, Grimacing Pain Intervention(s): Monitored during session    Home Living                          Prior Function            PT Goals (current goals can now be found in the care plan section) Progress towards PT goals: Progressing toward goals    Frequency    Min 3X/week      PT Plan Discharge plan needs to be updated    Co-evaluation              AM-PAC PT "6 Clicks" Mobility   Outcome Measure  Help needed turning from your back to your side while in a flat bed without using bedrails?: Total Help needed moving from lying on your back to sitting on the side of a flat bed without using bedrails?: Total Help needed moving to and from a bed to a chair (including a wheelchair)?: Total Help needed standing up from a chair using your arms (e.g., wheelchair or bedside chair)?: Total Help needed to walk in hospital room?:  Total Help needed climbing 3-5 steps with a railing? : Total 6 Click Score: 6    End of Session Equipment Utilized During Treatment: Gait belt;Oxygen Activity Tolerance: Patient tolerated treatment well Patient left: in chair;with call bell/phone within reach;with family/visitor present Nurse Communication: Mobility status;Need for lift equipment PT Visit Diagnosis: Other abnormalities of gait and mobility (R26.89);Other symptoms and signs involving the nervous system (R29.898)     Time: 2831-5176 PT Time Calculation (min) (ACUTE ONLY): 36 min  Charges:  $Therapeutic Exercise: 8-22 mins $Therapeutic Activity: 8-22 mins                     Magda Kiel, PT Acute Rehabilitation Services Office:[phone] 05/12/2022    [name] 05/12/2022, 1:40 PM

## 2022-05-12 NOTE — TOC Progression Note (Signed)
Transition of Care [facility]) - Progression Note    Patient Details  Name: Tara [name] MRN: [id number] Date of Birth: May 16, 1946  [facility] CM/SW Contact  Vinie Sill, [place] Phone Number: 05/12/2022, 10:11 AM  Clinical Narrative:     Rec'd pasrr #  [id number]  Sent message to [facility]  to review - waiting on response   TOC will continue to follow and assist with discharge planning.   Thurmond Butts, MSW, LCSW Clinical Social Worker    Expected Discharge Plan: Skilled Nursing Facility Barriers to Discharge: Continued Medical Work up, Ship broker  Expected Discharge Plan and Services Expected Discharge Plan: [place]   Discharge Planning Services: CM Consult   Living arrangements for the past 2 months: [place]: [place]         Social Determinants of Health (SDOH) Interventions    Readmission Risk Interventions    03/06/2022   12:36 PM  Readmission Risk Prevention Plan  Transportation Screening Complete  HRI or Home Care Consult Complete  Social Work Consult for [place] Planning/Counseling Complete

## 2022-05-12 NOTE — Progress Notes (Signed)
Patient ID: Tara [name], female   DOB: 1946/07/20, 76 y.o.   MRN: [id number] BP 126/62 (BP Location: Right Leg)   Pulse 89   Temp 98.2 F (36.8 C) (Oral)   Resp (!) 21   Ht '4\' 9"'$  (1.448 m)   Wt 74.7 kg   SpO2 96%   BMI 35.64 kg/m  Will open eyes and speak when spoken to.  Wound is clean dry no signs of infection Family questioning why inpatient rehabilitation is not an option.

## 2022-05-12 NOTE — Assessment & Plan Note (Addendum)
Replace and follow

## 2022-05-13 ENCOUNTER — Inpatient Hospital Stay (HOSPITAL_COMMUNITY): Payer: Medicare Other

## 2022-05-13 DIAGNOSIS — J9601 Acute respiratory failure with hypoxia: Secondary | ICD-10-CM | POA: Diagnosis not present

## 2022-05-13 DIAGNOSIS — I5043 Acute on chronic combined systolic (congestive) and diastolic (congestive) heart failure: Secondary | ICD-10-CM | POA: Diagnosis not present

## 2022-05-13 DIAGNOSIS — R569 Unspecified convulsions: Secondary | ICD-10-CM | POA: Diagnosis not present

## 2022-05-13 DIAGNOSIS — D62 Acute posthemorrhagic anemia: Secondary | ICD-10-CM | POA: Diagnosis not present

## 2022-05-13 LAB — CBC
HCT: 30.6 % — ABNORMAL LOW (ref 36.0–46.0)
Hemoglobin: 9.8 g/dL — ABNORMAL LOW (ref 12.0–15.0)
MCH: 27.5 pg (ref 26.0–34.0)
MCHC: 32 g/dL (ref 30.0–36.0)
MCV: 86 fL (ref 80.0–100.0)
Platelets: 278 10*3/uL (ref 150–400)
RBC: 3.56 MIL/uL — ABNORMAL LOW (ref 3.87–5.11)
RDW: 17.4 % — ABNORMAL HIGH (ref 11.5–15.5)
WBC: 18.5 10*3/uL — ABNORMAL HIGH (ref 4.0–10.5)
nRBC: 0 % (ref 0.0–0.2)

## 2022-05-13 LAB — POTASSIUM: Potassium: 4.1 mmol/L (ref 3.5–5.1)

## 2022-05-13 NOTE — TOC Progression Note (Signed)
Transition of Care [facility]) - Progression Note    Patient Details  Name: Tara [name] MRN: [id number] Date of Birth: 1946/06/01  Transition of [facility]) CM/SW [place], [place] Phone Number: 05/13/2022, 10:56 AM  Clinical Narrative:     Spoke with patient's daugter, by phone- informed [facility] unable to offer- she reports going to visit [facility] and will update CSW.   Thurmond Butts, MSW, LCSW Clinical Social Worker    Expected Discharge Plan: Skilled Nursing Facility Barriers to Discharge: Continued Medical Work up, Ship broker  Expected Discharge Plan and Services Expected Discharge Plan: [place]   Discharge Planning Services: CM Consult   Living arrangements for the past 2 months: [place]: [place]         Social Determinants of Health (SDOH) Interventions    Readmission Risk Interventions    03/06/2022   12:36 PM  Readmission Risk Prevention Plan  Transportation Screening Complete  HRI or Home Care Consult Complete  Social Work Consult for [place] Planning/Counseling Complete

## 2022-05-13 NOTE — Progress Notes (Signed)
Patient ID: Tara [name], female   DOB: 1946-06-05, 76 y.o.   MRN: [id number] BP 109/65 (BP Location: Right Leg)   Pulse 88   Temp 98.4 F (36.9 C) (Oral)   Resp 19   Ht '4\' 9"'$  (1.448 m)   Wt 74.7 kg   SpO2 98%   BMI 35.64 kg/m  Alert, speaking to me, answering some questions Not clear why she is only getting out of bed to the chair and not being walked or at the very least being stood in the room.  I have read the reasoning for not approving CIR. Seems self fulfilling that prognosis is not good enough, but less is being explored by the therapist's since she was standing last week.  Wound is clean,and dry.  Awaiting placement

## 2022-05-13 NOTE — Progress Notes (Signed)
PROGRESS NOTE    Tara [name]  TML:[id number] DOB: [birth year] DOA: 03/15/2022 PCP: [name], MD   Brief Narrative: 76 year old African-American female PMH of anxiety, depression, type II DM, HLD, PAF, on Eliquis, CKD 3B, breast cancer SP chemoradiation presented to hospital on 6/24 with new onset seizures. Known history of sphenoidal meningioma that was noted to be increased in size on the CT scan with surrounding edema so she is transferred from [facility] for further evaluation.  She underwent LTM with no further seizures and was evaluated by neurosurgery and underwent bifrontal craniotomy for meningioma resection on 03/24/2020 and was transferred to the intensive care unit postoperatively with PCCM consulting while she was in ICU. 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  Treated with Unasyn for 5 days. 7/11 recurrent sz like activity. Not correlated on EEG. CT unchanged.  7/13 neurology signed off. 7/19 started on ABX for sepsis then transition to Unasyn. 7/21 palliative care was consulted. 7/23 CT chest and abdomen was performed negative for PE or significant pneumonia, without any hemorrhage 7/25 MRI brain without contrast shows increasing mass effect, SDH as well as subacute/acute small scattered infarct. 7/26 tube feeds resumed, neurology reconsulted.  Eliquis resumed per neurosurgery 7/27 oxygenation improving, mentation improving, neurology following.  CTA head and neck without any LVO.  Lower extremity Doppler without any DVT.  Echo shows new systolic dysfunction with global hypokinesis EF 40 to 45% and significant RV failure. 7/30 had another aspiration event. Tube feeds on hold. Now on NRB. Remains full code. No  tracheostomy per husband. Per Neurosurgery, give at least two weeks if not more to see if she improves.  8/1 oxygenation improving again to 5 L.  Trickle tube feeds started. 8/9 on D1 honey thick liquid diet.  Calorie count initiated. 8/13 oxygenation improving to 2 L. 8/14 core track removed nocturnal tube feeds stopped. 8/15 leukocytosis worsening, abdomen distended, x-ray abdomen shows evidence of possible sigmoid volvulus, GI consulted, CT abdomen negative for any volvulus and only shows redundant sigmoid colon.  Will resume diet. 8/16-8/22: leukocytosis of unknown etiology.   Assessment and Plan: * Seizure ([place]) -Continue Keppra  Meningioma, cerebral ([place]) Per neurosurgery  Leukocytosis Recurrent. Know specific infectious source identified although patient has been recently treated for recurrent aspiration pneumonia. No current signs or symptoms for infection. WBC appears to have stabilized. -CBC in AM  Hypertension - Continue  Anxiety and depression Previously on Zoloft and Klonopin which are discontinued.  GERD -Continue Protonix  Hyperlipidemia -Continue Crestor  Dysphagia Previously with a Cortrak and tube feedings. Evaluated/followed by speech therapy. -Speech therapy recommendations (8/15): Diet recommendations: Dysphagia 2 (fine chop);Honey-thick liquid Liquids provided via: Cup;Straw Medication Administration: Crushed with puree Supervision: Full supervision/cueing for compensatory strategies;Staff to assist with self feeding Compensations: Slow rate;Small sips/bites;Multiple dry swallows after each bite/sip;Clear throat intermittently Postural Changes and/or Swallow Maneuvers: Seated upright 90 degrees  Acute postoperative anemia due to expected blood loss Acute anemia down to 6.5 requiring 2 units of PRBC. Hemoglobin currently stable.  Dysfunction of right cardiac ventricle Acute/chronic PE ruled out. Possibly related to pneumonia. Recommendation for  repeat Transthoracic Echocardiogram in 6 months. Patient can follow-up with pulmonology.  Acute respiratory failure with hypoxia (HCC) Secondary to pneumonia with associated tachypnea. Hypoxia improved  but not resolved. Repeat chest x-ray with edema vs pneumonia. Patient with leukocytosis and no other infectious symptoms. Down to 2 L/min with Lasix. -Wean as able  Atrial fibrillation, chronic (HCC) Patient is on rate/rhythm control and on anticoagulation. -Continue amiodarone and Eliquis  AKI (acute kidney injury) ([place]) Possibly secondary to lasix although now has stabilized. Weight appears to have downtrended from admission but is stable for the past week.  Acute on chronic combined systolic and diastolic CHF (congestive heart failure) (HCC) Most recent Transthoracic Echocardiogram with LVEF of 40-45% (down from 55%) with global hypokinesis and associated RV dysfunction noted. Patient with acute heart failure this admission that responded to Lasix. Currently resolved. Not on GDMT secondary to recent hypotension. -Continue Lasix  Hypokalemia Potassium of 4.1 this morning after supplementation.  Hypernatremia-resolved as of 05/07/2022 Resolved.  Thrombocytopenia (HCC)-resolved as of 05/07/2022 Resolved.    DVT prophylaxis: Eliquis Code Status:   Code Status: Full Code Family Communication: Sisters and neice at bedside Disposition Plan: Per primary    Procedures:    Antimicrobials:     Subjective: No issues overnight. Having bowel movements. No nausea or vomiting.  Objective: BP 109/65 (BP Location: Right Leg)   Pulse 88   Temp 98.4 F (36.9 C) (Oral)   Resp 19   Ht '4\' 9"'$  (1.448 m)   Wt 74.7 kg   SpO2 98%   BMI 35.64 kg/m   Examination:  General exam: Appears calm and comfortable Respiratory system: Clear to auscultation. Respiratory effort normal. Cardiovascular system: S1 & S2 heard, RRR. No murmurs, rubs, gallops or clicks. Gastrointestinal system: Abdomen  is distended, soft and non-tender. Decreased bowel sounds heard. Central nervous system: Alert. Musculoskeletal: No edema. No calf tenderness Skin: No cyanosis. No rashes   Data Reviewed: I have personally reviewed following labs and imaging studies  CBC Lab Results  Component Value Date   WBC 18.5 (H) 05/13/2022   RBC 3.56 (L) 05/13/2022   HGB 9.8 (L) 05/13/2022   HCT 30.6 (L) 05/13/2022   MCV 86.0 05/13/2022   MCH 27.5 05/13/2022   PLT 278 05/13/2022   MCHC 32.0 05/13/2022   RDW 17.4 (H) 05/13/2022   LYMPHSABS 2.1 05/10/2022   MONOABS 1.8 (H) 05/10/2022   EOSABS 0.8 (H) 05/10/2022   BASOSABS 0.1 87/68/1157     Last metabolic panel Lab Results  Component Value Date   NA 140 05/12/2022   K 4.1 05/13/2022   CL 111 05/12/2022   CO2 23 05/12/2022   BUN 23 05/12/2022   CREATININE 0.98 05/12/2022   GLUCOSE 112 (H) 05/12/2022   GFRNONAA 60 (L) 05/12/2022   GFRAA 43 (L) 08/07/2020   CALCIUM 8.7 (L) 05/12/2022   PHOS 5.1 (H) 05/07/2022   PROT 6.3 (L) 04/22/2022   ALBUMIN 2.4 (L) 05/07/2022   LABGLOB 2.7 01/02/2022   AGRATIO 1.5 01/02/2022   BILITOT 0.7 04/22/2022   ALKPHOS 78 04/22/2022   AST 39 04/22/2022   ALT 40 04/22/2022   ANIONGAP 6 05/12/2022    GFR: Estimated Creatinine Clearance: 40.9 mL/min (by C-G formula based on SCr of 0.98 mg/dL).  No results found for this or any previous visit (from the past 240 hour(s)).    Radiology Studies: No results found.    LOS: 15 days    Cordelia Poche, MD [facility] 05/13/2022, 3:07 PM   If 7PM-7AM, please contact night-coverage [email or web address]

## 2022-05-13 NOTE — Progress Notes (Signed)
Speech Language Pathology Treatment: Cognitive-Linquistic  Patient Details Name: Tara [name] MRN: [id number] DOB: [birth year] Today's Date: 05/13/2022 Time: 0913-0929 SLP Time Calculation (min) (ACUTE ONLY): 16 min  Assessment / Plan / Recommendation Clinical Impression  Pt more alert than previous encounters with family at bedside. She greeted the therapist by repeating what was said to her. Pt produced more verbal output today than previous encounters however, she continues to present with issues in initiation of speech indicated by her inability to name objects provided maximal visual, verbal and tactile cues. When given semantic and visual cues she named "comb". Pt more motivated to produce verbal output when asked questions regarding family, however, provided incorrect name for 1/2 of her children and could not produce her name. Pt initiation of motor movement also appears impaired as she could not use the comb provided maximal visual and verbal cueing. Consistant with previous encounters pt's upper extremities stayed fixed against her chest. Pt's speech inteligibility continues to be low due to articulatory errors and low intensity. Family educated on initiation issues and the importance of processing time. Recommend continue tx plan as written.     HPI HPI: Pt is a 76 y/o who presented 6/24 with new onset seizures. 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 EEG negative for seizures. Neurosurgey consulted and did not recommend meningioma resection as of 6/27. Decline in swallow function noted overnight 6/27. Dx acute toxic metabolic encephalopathy suspected secondary to keppra. Pt s/p Bifrontal craniotomy for resection of meningioma 7/3.  Cortrak 7/7. 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  CXR 8/6 concerning for patchy airspace disease. PMH: HTN, HLD, GERD, CKD stage IIIb, chronic HFpEF, meningioma, breast CA-s/p right lumpectomy and chemoradiation in  2008, depression/anxiety. BSE 03/11/22: regular texture diet and thin liquids without need for follow up.      SLP Plan  Continue with current plan of care      Recommendations for follow up therapy are one component of a multi-disciplinary discharge planning process, led by the attending physician.  Recommendations may be updated based on patient status, additional functional criteria and insurance authorization.    Recommendations                   Oral Care Recommendations: Oral care BID Follow Up Recommendations: Skilled nursing-short term rehab (<3 hours/day) Assistance recommended at discharge: Frequent or constant Supervision/Assistance SLP Visit Diagnosis: Cognitive communication deficit (I31.281) Plan: Continue with current plan of care           [facility] Student SLP   05/13/2022, 10:55 AM

## 2022-05-14 ENCOUNTER — Inpatient Hospital Stay (HOSPITAL_COMMUNITY): Payer: Medicare Other

## 2022-05-14 DIAGNOSIS — I10 Essential (primary) hypertension: Secondary | ICD-10-CM | POA: Diagnosis not present

## 2022-05-14 LAB — COMPREHENSIVE METABOLIC PANEL
ALT: 33 U/L (ref 0–44)
AST: 28 U/L (ref 15–41)
Albumin: 2.6 g/dL — ABNORMAL LOW (ref 3.5–5.0)
Alkaline Phosphatase: 89 U/L (ref 38–126)
Anion gap: 7 (ref 5–15)
BUN: 22 mg/dL (ref 8–23)
CO2: 25 mmol/L (ref 22–32)
Calcium: 8.7 mg/dL — ABNORMAL LOW (ref 8.9–10.3)
Chloride: 109 mmol/L (ref 98–111)
Creatinine, Ser: 1.1 mg/dL — ABNORMAL HIGH (ref 0.44–1.00)
GFR, Estimated: 52 mL/min — ABNORMAL LOW (ref >60)
Glucose, Bld: 111 mg/dL — ABNORMAL HIGH (ref 70–99)
Potassium: 4 mmol/L (ref 3.5–5.1)
Sodium: 141 mmol/L (ref 135–145)
Total Bilirubin: 0.3 mg/dL (ref 0.3–1.2)
Total Protein: 6.3 g/dL — ABNORMAL LOW (ref 6.5–8.1)

## 2022-05-14 LAB — CBC WITH DIFFERENTIAL/PLATELET
Abs Immature Granulocytes: 0.25 10*3/uL — ABNORMAL HIGH (ref 0.00–0.07)
Basophils Absolute: 0.1 10*3/uL (ref 0.0–0.1)
Basophils Relative: 1 %
Eosinophils Absolute: 0.7 10*3/uL — ABNORMAL HIGH (ref 0.0–0.5)
Eosinophils Relative: 4 %
HCT: 29.7 % — ABNORMAL LOW (ref 36.0–46.0)
Hemoglobin: 9.6 g/dL — ABNORMAL LOW (ref 12.0–15.0)
Immature Granulocytes: 1 %
Lymphocytes Relative: 10 %
Lymphs Abs: 1.9 10*3/uL (ref 0.7–4.0)
MCH: 28.2 pg (ref 26.0–34.0)
MCHC: 32.3 g/dL (ref 30.0–36.0)
MCV: 87.4 fL (ref 80.0–100.0)
Monocytes Absolute: 1.4 10*3/uL — ABNORMAL HIGH (ref 0.1–1.0)
Monocytes Relative: 8 %
Neutro Abs: 14.3 10*3/uL — ABNORMAL HIGH (ref 1.7–7.7)
Neutrophils Relative %: 76 %
Platelets: 260 10*3/uL (ref 150–400)
RBC: 3.4 MIL/uL — ABNORMAL LOW (ref 3.87–5.11)
RDW: 17.7 % — ABNORMAL HIGH (ref 11.5–15.5)
WBC: 18.7 10*3/uL — ABNORMAL HIGH (ref 4.0–10.5)
nRBC: 0 % (ref 0.0–0.2)

## 2022-05-14 NOTE — Progress Notes (Signed)
Patient ID: Tara [name], female   DOB: 17-Feb-1946, 76 y.o.   MRN: [id number] BP (!) 95/56 (BP Location: Right Leg)   Pulse 87   Temp 98.1 F (36.7 C) (Oral)   Resp (!) 22   Ht '4\' 9"'$  (1.448 m)   Wt 74.7 kg   SpO2 99%   BMI 35.64 kg/m  Alert, answering questions Following some commands Plan for placement now in place. Continues very slow improvement.

## 2022-05-14 NOTE — Progress Notes (Signed)
Physical Therapy Treatment Patient Details Name: Tara [name] MRN: [id number] DOB: 1946/01/25 Today's Date: 05/14/2022   History of Present Illness 76 yo woman admitted 03/15/22 with new onset seizures related to meningioma. s/p tumor resection 7/3. Recent DC from [facility] 6/22. MRI on 7/26 shows: SDH extending inferiorly along the clivus and  narrowing the foramen magnum with possible mass effect. PMH: CKD stage 3, HFpEF, a fib, meningioma.    PT Comments    Patient progressing slowly.  Today able to stand x 2 with +2 A and max support for hip extension and to prevent knees buckling.  Patient able to verbalize fearful of falling.  Also once trunk stretched pt able to demonstrate sitting balance with 1 UE support and able to relax arms once stretched with deep tendon pressure into R bicep.  Patient remains most appropriate for STSNF level rehab for decreased burden of care and for caregiver education.  PT goals updated today.  Will continue to follow.    Recommendations for follow up therapy are one component of a multi-disciplinary discharge planning process, led by the attending physician.  Recommendations may be updated based on patient status, additional functional criteria and insurance authorization.  Follow Up Recommendations  Skilled nursing-short term rehab (<3 hours/day)     Assistance Recommended at Discharge Frequent or constant Supervision/Assistance  Patient can return home with the following Two people to help with walking and/or transfers;Two people to help with bathing/dressing/bathroom;Assist for transportation;Assistance with feeding;Assistance with Education officer, environmental (measurements PT);Wheelchair cushion (measurements PT);Hospital bed;Other (comment);BSC/3in1    Recommendations for Other Services       Precautions / Restrictions Precautions Precautions: Fall Precaution Comments: seizures Restrictions Weight Bearing Restrictions: No      Mobility  Bed Mobility Overal bed mobility: Needs Assistance Bed Mobility: Rolling, Supine to Sit, Sit to Supine Rolling: Max assist   Supine to sit: Total assist, HOB elevated, +2 for physical assistance Sit to supine: Total assist, +2 for physical assistance, HOB elevated   General bed mobility comments: total A +2 for all aspects of bed mobility with pt needing MAX A to roll R<>L for pad change    Transfers Overall transfer level: Needs assistance Equipment used: 2 person hand held assist Transfers: Sit to/from Stand Sit to Stand: Max assist, +2 physical assistance, +2 safety/equipment           General transfer comment: pt completed x2 sit>stands from EOB with MAX A +2 pt with difficulty shifting hips anteriorly and elevating trunk into standing. total A for weight shift for pre-gait activity    Ambulation/Gait                   Stairs             Wheelchair Mobility    Modified Rankin (Stroke Patients Only) Modified Rankin (Stroke Patients Only) Pre-Morbid Rankin Score: Moderately severe disability Modified Rankin: Severe disability     Balance Overall balance assessment: Needs assistance Sitting-balance support: Feet supported, Bilateral upper extremity supported Sitting balance-Leahy Scale: Poor Sitting balance - Comments: initial MOD A for static sitting balance but progressed to min guard wit pt demonstrating ability to prop with LUE     Standing balance-Leahy Scale: Zero Standing balance comment: +2 A for standing                            Cognition Arousal/Alertness: Awake/alert (lethargic initially, but aroused with mobility)  Behavior During Therapy: Flat affect Overall Cognitive Status: Impaired/Different from baseline Area of Impairment: Attention, Following commands, Problem solving                   Current Attention Level: Focused   Following Commands: Follows one step commands with increased time,  Follows one step commands inconsistently     Problem Solving: Slow processing, Decreased initiation, Difficulty sequencing, Requires verbal cues, Requires tactile cues General Comments: responding to yes/no questions mostly flat during session, did arouse more as session progressed able to keep eyes open and follow commands        Exercises Other Exercises Other Exercises: stretching out bilateral UE's to allow L UE support in sitting Other Exercises: forward trunk flexion in sitting for passive stretch with improved sitting balance after    General Comments General comments (skin integrity, edema, etc.): 2LNC with SpO2 >95%; BP 106/81 post activity      Pertinent Vitals/Pain Pain Assessment Pain Assessment: No/denies pain Faces Pain Scale: Hurts a little bit Pain Location: general grimacing with movement Pain Descriptors / Indicators: Guarding, Grimacing Pain Intervention(s): Monitored during session    Home Living                          Prior Function            PT Goals (current goals can now be found in the care plan section) Acute Rehab PT Goals Patient Stated Goal: family wishing for home once able to care for her PT Goal Formulation: With patient/family Time For Goal Achievement: 05/28/22 Potential to Achieve Goals: Fair Progress towards PT goals: Progressing toward goals (goals updated today)    Frequency    Min 3X/week      PT Plan Current plan remains appropriate    Co-evaluation PT/OT/SLP Co-Evaluation/Treatment: Yes Reason for Co-Treatment: Complexity of the patient's impairments (multi-system involvement);Necessary to address cognition/behavior during functional activity;For patient/therapist safety;To address functional/ADL transfers PT goals addressed during session: Mobility/safety with mobility;Balance OT goals addressed during session: ADL's and self-care      AM-PAC PT "6 Clicks" Mobility   Outcome Measure  Help needed  turning from your back to your side while in a flat bed without using bedrails?: Total Help needed moving from lying on your back to sitting on the side of a flat bed without using bedrails?: Total Help needed moving to and from a bed to a chair (including a wheelchair)?: Total Help needed standing up from a chair using your arms (e.g., wheelchair or bedside chair)?: Total Help needed to walk in hospital room?: Total Help needed climbing 3-5 steps with a railing? : Total 6 Click Score: 6    End of Session Equipment Utilized During Treatment: Gait belt;Oxygen Activity Tolerance: Patient tolerated treatment well Patient left: in bed;with bed alarm set   PT Visit Diagnosis: Other abnormalities of gait and mobility (R26.89);Other symptoms and signs involving the nervous system (R29.898)     Time: 1749-4496 PT Time Calculation (min) (ACUTE ONLY): 21 min  Charges:                        Magda Kiel, PT Acute Rehabilitation Services Office:[phone] 05/14/2022    [name] 05/14/2022, 7:03 PM

## 2022-05-14 NOTE — TOC Progression Note (Signed)
Transition of Care [facility]) - Progression Note    Patient Details  Name: Tara [name] MRN: [id number] Date of Birth: 05/22/1946  Transition of Care [facility]) CM/SW [place] Phone Number: 05/14/2022, 1:27 PM  Clinical Narrative:     CSW spoke with patient's daughter, [name]. [name] confirmed disposition plan for the patient is short term rehab at [facility] and  then return home with the support of [facility] services. Family advised, patient was accepted by [facility].  Family informed-[facility] confirmed they have received and accepted  referral for [facility] PT/OT/RN/Aide and they can add SW if needed. Family is agreeable to [employer].   CSW started insurance auth for [employer].  TOC will continue to follow and assist with discharge planning.    Thurmond Butts, MSW, LCSW Clinical Social Worker    Expected Discharge Plan: Skilled Nursing Facility Barriers to Discharge: Continued Medical Work up, Ship broker  Expected Discharge Plan and Services Expected Discharge Plan: [place]   Discharge Planning Services: CM Consult   Living arrangements for the past 2 months: [place]: [place]         Social Determinants of Health (SDOH) Interventions    Readmission Risk Interventions    03/06/2022   12:36 PM  Readmission Risk Prevention Plan  Transportation Screening Complete  HRI or Home Care Consult Complete  Social Work Consult for [place] Planning/Counseling Complete

## 2022-05-14 NOTE — Progress Notes (Signed)
PROGRESS NOTE    Tara [name]  GHW:[id number] DOB: September 19, 1946 DOA: 03/15/2022 PCP: [name], MD  Chief Complaint  Patient presents with   Seizures    Brief Narrative:  76 year old African-American female PMH of anxiety, depression, type II DM, HLD, PAF, on Eliquis, CKD 3B, breast cancer SP chemoradiation presented to hospital on 6/24 with new onset seizures. Known history of sphenoidal meningioma that was noted to be increased in size on the CT scan with surrounding edema so she is transferred from [facility] for further evaluation.  She underwent LTM with no further seizures and was evaluated by neurosurgery and underwent bifrontal craniotomy for meningioma resection on 03/24/2020 and was transferred to the intensive care unit postoperatively with PCCM consulting while she was in ICU. 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  Treated with Unasyn for 5 days. 7/11 recurrent sz like activity. Not correlated on EEG. CT unchanged.  7/13 neurology signed off. 7/19 started on ABX for sepsis then transition to Unasyn. 7/21 palliative care was consulted. 7/23 CT chest and abdomen was performed negative for PE or significant pneumonia, without any hemorrhage 7/25 MRI brain without contrast shows increasing mass effect, SDH as well as subacute/acute small scattered infarct. 7/26 tube feeds resumed, neurology reconsulted.  Eliquis resumed per neurosurgery 7/27 oxygenation improving, mentation improving, neurology following.  CTA head and neck without any LVO.  Lower extremity Doppler without any DVT.  Echo shows new systolic dysfunction with global hypokinesis EF 40 to 45% and significant RV failure. 7/30 had another aspiration event.  Tube feeds on hold. Now on NRB. Remains full code. No tracheostomy per husband. Per Neurosurgery, give at least two weeks if not more to see if she improves.  8/1 oxygenation improving again to 5 L.  Trickle tube feeds started. 8/9 on D1 honey thick liquid diet.  Calorie count initiated. 8/13 oxygenation improving to 2 L. 8/14 core track removed nocturnal tube feeds stopped. 8/15 leukocytosis worsening, abdomen distended, x-ray abdomen shows evidence of possible sigmoid volvulus, GI consulted, CT abdomen negative for any volvulus and only shows redundant sigmoid colon.  Will resume diet. 8/16-8/22: leukocytosis of unknown etiology.    Assessment & Plan:   Principal Problem:   Seizure ([place]) Active Problems:   Meningioma, cerebral (HCC)   Leukocytosis   Acute on chronic combined systolic and diastolic CHF (congestive heart failure) (HCC)   Acute respiratory failure with hypoxia (HCC)   Hypertension   Anxiety and depression   GERD   Hyperlipidemia   Hypokalemia   AKI (acute kidney injury) ([place])   Atrial fibrillation, chronic (HCC)   Protein-calorie malnutrition, moderate (HCC)   Meningioma (HCC)   Pressure injury of skin   Aspiration into airway   Palliative care encounter   Dysfunction of right cardiac ventricle   Acute postoperative anemia due to expected blood loss   Dysphagia   Assessment and Plan: * Seizure ([place]) -Continue Keppra  Meningioma, cerebral ([place]) Per neurosurgery  Leukocytosis Recurrent. Know specific infectious source identified although patient has been recently treated for recurrent aspiration pneumonia. Recently completed course of unasyn 8/3 CXR 8/19 concerning for edema vs pneumia Will repeat CXR  Acute respiratory failure with hypoxia ([place]) Secondary to pneumonia with associated tachypnea. Hypoxia improved but not resolved. Repeat  chest x-ray with edema vs pneumonia. Patient with leukocytosis and no other infectious symptoms. Down to 2 L/min with  Lasix. -Wean as able -repeat CXR pending  Acute on chronic combined systolic and diastolic CHF (congestive heart failure) ([place]) Most recent Transthoracic Echocardiogram with LVEF of 40-45% (down from 55%) with global hypokinesis and associated RV dysfunction noted. Patient with acute heart failure this admission that responded to Lasix. Currently resolved. Not on GDMT secondary to recent hypotension. -Continue Lasix  Hypertension - Continue lasix, currently on midodrine  Anxiety and depression Previously on Zoloft and Klonopin which are discontinued.  GERD -Continue Protonix  Atrial fibrillation, chronic (HCC) Patient is on rate/rhythm control and on anticoagulation. -Continue amiodarone and Eliquis  AKI (acute kidney injury) ([place]) Possibly secondary to lasix although now has stabilized. Weight appears to have downtrended from admission but is stable for the past week.  Hypokalemia Replace and follow  Hyperlipidemia -Continue Crestor  Dysphagia Previously with Orlando Thalmann Cortrak and tube feedings. Evaluated/followed by speech therapy. -Speech therapy recommendations (8/15): Diet recommendations: Dysphagia 2 (fine chop);Honey-thick liquid Liquids provided via: Cup;Straw Medication Administration: Crushed with puree Supervision: Full supervision/cueing for compensatory strategies;Staff to assist with self feeding Compensations: Slow rate;Small sips/bites;Multiple dry swallows after each bite/sip;Clear throat intermittently Postural Changes and/or Swallow Maneuvers: Seated upright 90 degrees  Acute postoperative anemia due to expected blood loss Acute anemia down to 6.5 requiring 2 units of PRBC. Hemoglobin currently stable.  Dysfunction of right cardiac ventricle Acute/chronic PE ruled out. Possibly related to pneumonia. Recommendation for repeat Transthoracic Echocardiogram in 6 months. Patient can follow-up with pulmonology.  Hypernatremia-resolved as of  05/07/2022 Resolved.  Thrombocytopenia (HCC)-resolved as of 05/07/2022 Resolved.          DVT prophylaxis: eliquis Code Status: full Family Communication: husband at bedside Disposition:   Status is: Inpatient Remains inpatient appropriate because: per primary   Consultants:  Neurosurgery Neurology Pccm Palliative care GI  Procedures:  See significant events above  Antimicrobials:  Anti-infectives (From admission, onward)    Start     Dose/Rate Route Frequency Ordered Stop   04/21/22 0000  Ampicillin-Sulbactam (UNASYN) 3 g in sodium chloride 0.9 % 100 mL IVPB        3 g 200 mL/hr over 30 Minutes Intravenous Every 8 hours 04/20/22 1913 04/25/22 0018   04/12/22 1500  Ampicillin-Sulbactam (UNASYN) 3 g in sodium chloride 0.9 % 100 mL IVPB  Status:  Discontinued        3 g 200 mL/hr over 30 Minutes Intravenous Every 8 hours 04/12/22 1409 04/20/22 1913   04/11/22 1845  Ampicillin-Sulbactam (UNASYN) 3 g in sodium chloride 0.9 % 100 mL IVPB  Status:  Discontinued        3 g 200 mL/hr over 30 Minutes Intravenous Every 8 hours 04/11/22 1754 04/12/22 1409   04/10/22 1200  vancomycin (VANCOREADY) IVPB 750 mg/150 mL  Status:  Discontinued        750 mg 150 mL/hr over 60 Minutes Intravenous Every 24 hours 04/09/22 1034 04/11/22 1100   04/10/22 0900  metroNIDAZOLE (FLAGYL) IVPB 500 mg  Status:  Discontinued        500 mg 100 mL/hr over 60 Minutes Intravenous Every 12 hours 04/10/22 0754 04/11/22 1754   04/09/22 2300  ceFEPIme (MAXIPIME) 2 g in sodium chloride 0.9 % 100 mL IVPB  Status:  Discontinued        2 g 200 mL/hr over 30 Minutes Intravenous Every 12 hours 04/09/22 1035 04/11/22 1717   04/09/22 1045  vancomycin (VANCOREADY) IVPB 1750 mg/350 mL        1,750 mg 175 mL/hr over 120 Minutes Intravenous  Once 04/09/22 0953 04/09/22 1344   04/09/22 1045  ceFEPIme (MAXIPIME) 2 g in sodium chloride 0.9 % 100 mL IVPB        2 g 200 mL/hr over 30 Minutes Intravenous  Once  04/09/22 0953 04/09/22 1524   03/27/22 0945  Ampicillin-Sulbactam (UNASYN) 3 g in sodium chloride 0.9 % 100 mL IVPB        3 g 200 mL/hr over 30 Minutes Intravenous Every 6 hours 03/27/22 0859 04/01/22 0518   03/25/22 0600  ceFAZolin (ANCEF) IVPB 2g/100 mL premix        2 g 200 mL/hr over 30 Minutes Intravenous On call to O.R. 03/24/22 1215 03/24/22 1440       Subjective: Sleeping, discussed with husband at bedside  Objective: Vitals:   05/14/22 0359 05/14/22 0700 05/14/22 0904 05/14/22 1056  BP: 118/68 (!) 128/54  125/67  Pulse: 95 89  86  Resp: 19 (!) 22  15  Temp: 97.9 F (36.6 C) 98.6 F (37 C)  97.8 F (36.6 C)  TempSrc: Axillary Oral  Oral  SpO2: 95% 93% 100% 93%  Weight:      Height:       No intake or output data in the 24 hours ending 05/14/22 1746 Filed Weights   05/03/22 0345 05/04/22 0413 05/06/22 0500  Weight: 73.6 kg 74.7 kg 74.7 kg    Examination:  General exam: Appears calm and comfortable  Respiratory system: unlabored Cardiovascular system: RRR Gastrointestinal system: Abdomen is nondistended, soft and nontender Central nervous system: sleeping (husband notes she's had Twila Rappa good day so far today) Extremities: no LEE  Data Reviewed: I have personally reviewed following labs and imaging studies  CBC: Recent Labs  Lab 05/10/22 0117 05/11/22 0336 05/12/22 0620 05/13/22 1022 05/14/22 1048  WBC 20.7* 19.7* 19.4* 18.5* 18.7*  NEUTROABS 15.4*  --   --   --  14.3*  HGB 9.1* 9.1* 9.6* 9.8* 9.6*  HCT 28.0* 28.6* 30.4* 30.6* 29.7*  MCV 85.1 87.2 86.6 86.0 87.4  PLT 270 243 256 278 660    Basic Metabolic Panel: Recent Labs  Lab 05/09/22 0230 05/10/22 0117 05/12/22 0620 05/13/22 1022 05/14/22 1048  NA 139 140 140  --  141  K 3.1* 3.7 3.2* 4.1 4.0  CL 101 106 111  --  109  CO2 '25 23 23  '$ --  25  GLUCOSE 125* 124* 112*  --  111*  BUN 28* 28* 23  --  22  CREATININE 1.36* 1.16* 0.98  --  1.10*  CALCIUM 8.9 8.9 8.7*  --  8.7*     GFR: Estimated Creatinine Clearance: 36.4 mL/min (Oddie Kuhlmann) (by C-G formula based on SCr of 1.1 mg/dL (H)).  Liver Function Tests: Recent Labs  Lab 05/14/22 1048  AST 28  ALT 33  ALKPHOS 89  BILITOT 0.3  PROT 6.3*  ALBUMIN 2.6*    CBG: No results for input(s): "GLUCAP" in the last 168 hours.   No results found for this or any previous visit (from the past 240 hour(s)).       Radiology Studies: DG Abd Portable 1V  Result Date: 05/13/2022 CLINICAL DATA:  Distended abdomen EXAM: PORTABLE ABDOMEN - 1 VIEW COMPARISON:  05/06/2022 FINDINGS: Normal bowel gas pattern. No dilated bowel loops. Surgical clips right upper quadrant. No urinary tract calculi. IMPRESSION: Normal bowel gas pattern. Electronically Signed  By: Franchot Gallo M.D.   On: 05/13/2022 16:09        Scheduled Meds:  amiodarone  200 mg Oral Daily   apixaban  5 mg Oral BID   budesonide (PULMICORT) nebulizer solution  0.25 mg Nebulization BID   furosemide  40 mg Oral QODAY   lacosamide  200 mg Oral BID   levETIRAcetam  1,000 mg Oral BID   midodrine  5 mg Oral BID WC   montelukast  10 mg Oral QHS   multivitamin with minerals  1 tablet Oral Daily   mouth rinse  15 mL Mouth Rinse 4 times per day   pantoprazole  40 mg Oral BID   rosuvastatin  20 mg Oral Daily   senna-docusate  1 tablet Oral BID   Continuous Infusions:   LOS: 60 days    Time spent: over 30 min    [name], MD [facility]   To contact the attending provider between 7A-7P or the covering provider during after hours 7P-7A, please log into the web site [email or web address] and access using universal [place] password for that web site. If you do not have the password, please call the hospital operator.  05/14/2022, 5:46 PM

## 2022-05-14 NOTE — Progress Notes (Signed)
Occupational Therapy Treatment Patient Details Name: Tara [name] MRN: [id number] DOB: 23-Jul-1946 Today's Date: 05/14/2022   History of present illness 76 yo woman admitted 03/15/22 with new onset seizures related to meningioma. s/p tumor resection 7/3. Recent DC from [facility] 6/22. MRI on 7/26 shows: SDH extending inferiorly along the clivus and  narrowing the foramen magnum with possible mass effect. PMH: CKD stage 3, HFpEF, a fib, meningioma.   OT comments  Pt seen in conjunction with PT to maximize activity tolerance and participation. Pt received supine in bed, pt lethargic but able to arouse. Pt responds to yes/no questions during session. Session focus on practicing sit>stands from EOB as precursor to higher level functional mobility. Pt completed x2 sit>stands from EOB with overall MAX +2 with HHA. Pt continues to present with impaired balance, decreased activity tolerance, and impaired cognition. Pt was able to statically sit EOB today with Min guard assist. Pt would continue to benefit from skilled occupational therapy while admitted and after d/c to address the below listed limitations in order to improve overall functional mobility and facilitate independence with BADL participation. DC plan remains appropriate, will follow acutely per POC.     Recommendations for follow up therapy are one component of a multi-disciplinary discharge planning process, led by the attending physician.  Recommendations may be updated based on patient status, additional functional criteria and insurance authorization.    Follow Up Recommendations  Skilled nursing-short term rehab (<3 hours/day)    Assistance Recommended at Discharge Frequent or constant Supervision/Assistance  Patient can return home with the following  Two people to help with walking and/or transfers;Two people to help with bathing/dressing/bathroom;Assistance with cooking/housework;Assistance with feeding;Direct supervision/assist for  medications management;Direct supervision/assist for financial management;Assist for transportation;Help with stairs or ramp for entrance   Equipment Recommendations  Other (comment) (defer to next venue of care)    Recommendations for Other Services      Precautions / Restrictions Precautions Precautions: Fall Precaution Comments: seizures Restrictions Weight Bearing Restrictions: No       Mobility Bed Mobility Overal bed mobility: Needs Assistance Bed Mobility: Rolling, Supine to Sit, Sit to Supine Rolling: Max assist   Supine to sit: Total assist, HOB elevated, +2 for physical assistance Sit to supine: Total assist, +2 for physical assistance, HOB elevated   General bed mobility comments: total A +2 for all aspects of bed mobility with pt needing MAX A to roll R<>L for pad change    Transfers Overall transfer level: Needs assistance Equipment used: 2 person hand held assist Transfers: Sit to/from Stand Sit to Stand: Max assist, +2 physical assistance, +2 safety/equipment           General transfer comment: pt completed x2 sit>stands from EOB with MAX A +2 pt with difficulty shifting hips anteriorly and elevating trunk into standing. able to shift weight R<>L with total A as precursor to functional gait     Balance Overall balance assessment: Needs assistance Sitting-balance support: Feet supported, Bilateral upper extremity supported Sitting balance-Leahy Scale: Fair Sitting balance - Comments: initial MOD A for static sitting balance but progressed to min guard wit pt demonstrating ability to prop with LUE     Standing balance-Leahy Scale: Zero                             ADL either performed or assessed with clinical judgement   ADL Overall ADL's : Needs assistance/impaired  Lower Body Bathing: Total assistance;Bed level Lower Body Bathing Details (indicate cue type and reason): simulated via posterior pericare          Toilet Transfer: Total assistance;+2 for physical assistance (sit>stand only from EOB)   Toileting- Clothing Manipulation and Hygiene: Total assistance;Bed level Toileting - Clothing Manipulation Details (indicate cue type and reason): incontinent     Functional mobility during ADLs: Total assistance;+2 for safety/equipment;+2 for physical assistance General ADL Comments: ADL particpation limited by impaired cog,impaired balance, decreased level of arousal, and  impaired attention    Extremity/Trunk Assessment Upper Extremity Assessment Upper Extremity Assessment: Generalized weakness;RUE deficits/detail;LUE deficits/detail RUE Deficits / Details: flexor synergy but able to stretch out to neutral LUE Deficits / Details: LUE tends to more freely but prefers to stay in flexor synergy pattern   Lower Extremity Assessment Lower Extremity Assessment: Defer to PT evaluation        Vision Baseline Vision/History: 1 Wears glasses Ability to See in Adequate Light: 0 Adequate     Perception Perception Perception: Not tested   Praxis Praxis Praxis: Not tested    Cognition Arousal/Alertness: Awake/alert (initially lethargic but arouses as session progresses) Behavior During Therapy: Flat affect Overall Cognitive Status: Impaired/Different from baseline Area of Impairment: Attention, Following commands, Problem solving                   Current Attention Level: Focused   Following Commands: Follows one step commands with increased time, Follows one step commands inconsistently     Problem Solving: Slow processing, Decreased initiation, Difficulty sequencing, Requires verbal cues, Requires tactile cues General Comments: responding to yes/no questions mostly flat during session, did arouse more as session progressed able to keep eyes open and follow commands        Exercises      Shoulder Instructions       General Comments pt on 2L [place] with SpO2 >95%, BP 106/81 post  activity    Pertinent Vitals/ Pain       Pain Assessment Pain Assessment: Faces Faces Pain Scale: Hurts a little bit Pain Location: general grimacing with movement Pain Descriptors / Indicators: Guarding, Grimacing Pain Intervention(s): Monitored during session  Home Living                                          Prior Functioning/Environment              Frequency  Min 2X/week        Progress Toward Goals  OT Goals(current goals can now be found in the care plan section)  Progress towards OT goals: Progressing toward goals (incrementally)  Acute Rehab OT Goals OT Goal Formulation: Patient unable to participate in goal setting Time For Goal Achievement: 05/16/22 Potential to Achieve Goals: [place] Discharge plan remains appropriate;Frequency remains appropriate    Co-evaluation      Reason for Co-Treatment: Complexity of the patient's impairments (multi-system involvement);To address functional/ADL transfers;Necessary to address cognition/behavior during functional activity;For patient/therapist safety   OT goals addressed during session: ADL's and self-care      AM-PAC OT "6 Clicks" Daily Activity     Outcome Measure   Help from another person eating meals?: Total Help from another person taking care of personal grooming?: Total Help from another person toileting, which includes using toliet, bedpan, or urinal?: Total Help from another person bathing (including washing, rinsing,  drying)?: Total Help from another person to put on and taking off regular upper body clothing?: Total Help from another person to put on and taking off regular lower body clothing?: Total 6 Click Score: 6    End of Session Equipment Utilized During Treatment: Gait belt;Oxygen;Other (comment) (2L)  OT Visit Diagnosis: Unsteadiness on feet (R26.81);Other abnormalities of gait and mobility (R26.89);Muscle weakness (generalized) (M62.81);Low vision, both eyes  (H54.2);Other symptoms and signs involving cognitive function;Pain Pain - part of body:  (generalized)   Activity Tolerance Patient tolerated treatment well   Patient Left in bed;with call bell/phone within reach;with bed alarm set   Nurse Communication          Time: 7017-7939 OT Time Calculation (min): 21 min  Charges: OT General Charges $OT Visit: 1 Visit OT Treatments $Therapeutic Activity: 8-22 mins  Harley Alto., COTA/L Acute Rehabilitation Services [phone]   Precious Haws 05/14/2022, 4:04 PM

## 2022-05-15 ENCOUNTER — Inpatient Hospital Stay (HOSPITAL_COMMUNITY): Payer: Medicare Other

## 2022-05-15 DIAGNOSIS — R252 Cramp and spasm: Secondary | ICD-10-CM

## 2022-05-15 DIAGNOSIS — I6349 Cerebral infarction due to embolism of other cerebral artery: Secondary | ICD-10-CM | POA: Diagnosis not present

## 2022-05-15 DIAGNOSIS — R569 Unspecified convulsions: Secondary | ICD-10-CM | POA: Diagnosis not present

## 2022-05-15 DIAGNOSIS — D329 Benign neoplasm of meninges, unspecified: Secondary | ICD-10-CM | POA: Diagnosis not present

## 2022-05-15 DIAGNOSIS — I10 Essential (primary) hypertension: Secondary | ICD-10-CM | POA: Diagnosis not present

## 2022-05-15 DIAGNOSIS — R5383 Other fatigue: Secondary | ICD-10-CM

## 2022-05-15 LAB — CBC WITH DIFFERENTIAL/PLATELET
Abs Immature Granulocytes: 0.15 10*3/uL — ABNORMAL HIGH (ref 0.00–0.07)
Basophils Absolute: 0.1 10*3/uL (ref 0.0–0.1)
Basophils Relative: 0 %
Eosinophils Absolute: 0.5 10*3/uL (ref 0.0–0.5)
Eosinophils Relative: 3 %
HCT: 28.6 % — ABNORMAL LOW (ref 36.0–46.0)
Hemoglobin: 9.2 g/dL — ABNORMAL LOW (ref 12.0–15.0)
Immature Granulocytes: 1 %
Lymphocytes Relative: 10 %
Lymphs Abs: 1.8 10*3/uL (ref 0.7–4.0)
MCH: 27.4 pg (ref 26.0–34.0)
MCHC: 32.2 g/dL (ref 30.0–36.0)
MCV: 85.1 fL (ref 80.0–100.0)
Monocytes Absolute: 1.2 10*3/uL — ABNORMAL HIGH (ref 0.1–1.0)
Monocytes Relative: 7 %
Neutro Abs: 14 10*3/uL — ABNORMAL HIGH (ref 1.7–7.7)
Neutrophils Relative %: 79 %
Platelets: 257 10*3/uL (ref 150–400)
RBC: 3.36 MIL/uL — ABNORMAL LOW (ref 3.87–5.11)
RDW: 17.7 % — ABNORMAL HIGH (ref 11.5–15.5)
WBC: 17.7 10*3/uL — ABNORMAL HIGH (ref 4.0–10.5)
nRBC: 0 % (ref 0.0–0.2)

## 2022-05-15 LAB — COMPREHENSIVE METABOLIC PANEL
ALT: 34 U/L (ref 0–44)
AST: 27 U/L (ref 15–41)
Albumin: 2.7 g/dL — ABNORMAL LOW (ref 3.5–5.0)
Alkaline Phosphatase: 92 U/L (ref 38–126)
Anion gap: 9 (ref 5–15)
BUN: 22 mg/dL (ref 8–23)
CO2: 24 mmol/L (ref 22–32)
Calcium: 8.8 mg/dL — ABNORMAL LOW (ref 8.9–10.3)
Chloride: 107 mmol/L (ref 98–111)
Creatinine, Ser: 1.03 mg/dL — ABNORMAL HIGH (ref 0.44–1.00)
GFR, Estimated: 56 mL/min — ABNORMAL LOW (ref >60)
Glucose, Bld: 112 mg/dL — ABNORMAL HIGH (ref 70–99)
Potassium: 3.8 mmol/L (ref 3.5–5.1)
Sodium: 140 mmol/L (ref 135–145)
Total Bilirubin: 0.4 mg/dL (ref 0.3–1.2)
Total Protein: 6.6 g/dL (ref 6.5–8.1)

## 2022-05-15 LAB — PHOSPHORUS: Phosphorus: 4.6 mg/dL (ref 2.5–4.6)

## 2022-05-15 LAB — MAGNESIUM: Magnesium: 1.8 mg/dL (ref 1.7–2.4)

## 2022-05-15 MED ORDER — LEVETIRACETAM 750 MG PO TABS
750.0000 mg | ORAL_TABLET | Freq: Two times a day (BID) | ORAL | Status: DC
  Administered 2022-05-15 – 2022-06-10 (×52): 750 mg via ORAL
  Filled 2022-05-15 (×55): qty 1

## 2022-05-15 NOTE — Plan of Care (Signed)
Problem: Education: Goal: Ability to describe self-care measures that may prevent or decrease complications (Diabetes Survival Skills Education) will improve Outcome: Progressing Goal: Individualized Educational Video(s) Outcome: Progressing   Problem: Coping: Goal: Ability to adjust to condition or change in health will improve Outcome: Progressing   Problem: Fluid Volume: Goal: Ability to maintain a balanced intake and output will improve Outcome: Progressing   Problem: Health Behavior/Discharge Planning: Goal: Ability to identify and utilize available resources and services will improve Outcome: Progressing Goal: Ability to manage health-related needs will improve Outcome: Progressing   Problem: Metabolic: Goal: Ability to maintain appropriate glucose levels will improve Outcome: Progressing   Problem: Nutritional: Goal: Maintenance of adequate nutrition will improve Outcome: Progressing Goal: Progress toward achieving an optimal weight will improve Outcome: Progressing   Problem: Skin Integrity: Goal: Risk for impaired skin integrity will decrease Outcome: Progressing   Problem: Tissue Perfusion: Goal: Adequacy of tissue perfusion will improve Outcome: Progressing   Problem: Education: Goal: Knowledge of General Education information will improve Description: Including pain rating scale, medication(s)/side effects and non-pharmacologic comfort measures Outcome: Progressing   Problem: Health Behavior/Discharge Planning: Goal: Ability to manage health-related needs will improve Outcome: Progressing   Problem: Clinical Measurements: Goal: Ability to maintain clinical measurements within normal limits will improve Outcome: Progressing Goal: Will remain free from infection Outcome: Progressing Goal: Diagnostic test results will improve Outcome: Progressing Goal: Respiratory complications will improve Outcome: Progressing Goal: Cardiovascular complication will  be avoided Outcome: Progressing   Problem: Activity: Goal: Risk for activity intolerance will decrease Outcome: Progressing   Problem: Nutrition: Goal: Adequate nutrition will be maintained Outcome: Progressing   Problem: Coping: Goal: Level of anxiety will decrease Outcome: Progressing   Problem: Elimination: Goal: Will not experience complications related to bowel motility Outcome: Progressing Goal: Will not experience complications related to urinary retention Outcome: Progressing   Problem: Pain Managment: Goal: General experience of comfort will improve Outcome: Progressing   Problem: Safety: Goal: Ability to remain free from injury will improve Outcome: Progressing   Problem: Skin Integrity: Goal: Risk for impaired skin integrity will decrease Outcome: Progressing   Problem: Education: Goal: Expressions of having a comfortable level of knowledge regarding the disease process will increase Outcome: Progressing   Problem: Coping: Goal: Ability to adjust to condition or change in health will improve Outcome: Progressing Goal: Ability to identify appropriate support needs will improve Outcome: Progressing   Problem: Health Behavior/Discharge Planning: Goal: Compliance with prescribed medication regimen will improve Outcome: Progressing   Problem: Education: Goal: Knowledge of General Education information will improve Description: Including pain rating scale, medication(s)/side effects and non-pharmacologic comfort measures Outcome: Progressing   Problem: Health Behavior/Discharge Planning: Goal: Ability to manage health-related needs will improve Outcome: Progressing   Problem: Clinical Measurements: Goal: Ability to maintain clinical measurements within normal limits will improve Outcome: Progressing Goal: Will remain free from infection Outcome: Progressing Goal: Diagnostic test results will improve Outcome: Progressing Goal: Respiratory  complications will improve Outcome: Progressing Goal: Cardiovascular complication will be avoided Outcome: Progressing   Problem: Activity: Goal: Risk for activity intolerance will decrease Outcome: Progressing   Problem: Nutrition: Goal: Adequate nutrition will be maintained Outcome: Progressing   Problem: Coping: Goal: Level of anxiety will decrease Outcome: Progressing   Problem: Elimination: Goal: Will not experience complications related to bowel motility Outcome: Progressing Goal: Will not experience complications related to urinary retention Outcome: Progressing   Problem: Pain Managment: Goal: General experience of comfort will improve Outcome: Progressing   Problem: Safety: Goal: Ability  to remain free from injury will improve Outcome: Progressing   Problem: Skin Integrity: Goal: Risk for impaired skin integrity will decrease Outcome: Progressing

## 2022-05-15 NOTE — Progress Notes (Signed)
Speech Language Pathology Treatment: Cognitive-Linquistic;Dysphagia  Patient Details Name: Tara [name] MRN: [id number] DOB: [birth year] Today's Date: 05/15/2022 Time: 2951-8841 SLP Time Calculation (min) (ACUTE ONLY): 27 min  Assessment / Plan / Recommendation Clinical Impression  Pt awake with husband present. Consistent with previous encounters, pt displayed difficulty initiating speech and was unable to follow commands given maximal verbal/tactile cues. Pt displayed more sustained attention throughout encounter, indicated by her increased eye contact. Pt answered simple yes/no questions. In previous encounters the pt only responded with "yes," however, pt answered "no" in this encounter indicating a potential increase in attention or initiation. Pt observed with honey thick liquids/Dys2 consistency, which husband assisted her with. Mastication appeared [facility] and pt displayed no s/sx of aspiration across all trials. Consistent with previous encounters husband provided large boluses. Husband educated on diet types and informed of treatment plan recomendations. Recommend pt continue current diet of dys2/honey thick liquids until repeat MBS is performed to evaluate if pt ready to advance diet which may be next week.    HPI HPI: Pt is a 76 y/o who presented 6/24 with new onset seizures. 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 EEG negative for seizures. Neurosurgey consulted and did not recommend meningioma resection as of 6/27. Decline in swallow function noted overnight 6/27. Dx acute toxic metabolic encephalopathy suspected secondary to keppra. Pt s/p Bifrontal craniotomy for resection of meningioma 7/3.  Cortrak 7/7. 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  CXR 8/6 concerning for patchy airspace disease. PMH: HTN, HLD, GERD, CKD stage IIIb, chronic HFpEF, meningioma, breast CA-s/p right lumpectomy and chemoradiation in 2008, depression/anxiety. BSE 03/11/22:  regular texture diet and thin liquids without need for follow up.      SLP Plan  Continue with current plan of care      Recommendations for follow up therapy are one component of a multi-disciplinary discharge planning process, led by the attending physician.  Recommendations may be updated based on patient status, additional functional criteria and insurance authorization.    Recommendations  Diet recommendations: Dysphagia 2 (fine chop);Honey-thick liquid Liquids provided via: Cup Medication Administration: Crushed with puree Supervision: Full supervision/cueing for compensatory strategies Compensations: Slow rate;Small sips/bites Postural Changes and/or Swallow Maneuvers: Seated upright 90 degrees                Oral Care Recommendations: Oral care BID Follow Up Recommendations: Skilled nursing-short term rehab (<3 hours/day) Assistance recommended at discharge: Frequent or constant Supervision/Assistance SLP Visit Diagnosis: Cognitive communication deficit (Y60.630) Plan: Continue with current plan of care           [facility] Student SLP   05/15/2022, 11:23 AM

## 2022-05-15 NOTE — Procedures (Signed)
Patient Name: Tara [name]  MRN: [id number]  Epilepsy Attending: Lora Havens  Referring Physician/Provider: Elodia Florence., MD  Date: 05/15/2022  Duration: 22.12 mins  Patient history:  76 y.o. female with past medical history of anxiety, depression, GERD, hypertension, hyperlipidemia, nonischemic cardiomyopathy, atrial fibrillation on eliquis, Systolic HF, hx of breast cancer, meningioma who presented to the hospital on 03/15/2022 for evaluation of new onset seizures.   Level of alertness: Awake  AEDs during EEG study: LEV, LCM  Technical aspects: This EEG study was done with scalp electrodes positioned according to the 10-20 International system of electrode placement. Electrical activity was reviewed with band pass filter of 1-'70Hz'$ , sensitivity of 7 uV/mm, display speed of 24m/sec with a '60Hz'$  notched filter applied as appropriate. EEG data were recorded continuously and digitally stored.  Video monitoring was available and reviewed as appropriate.  Description: No clear posterior dominant rhythm was seen.  EEG showed continuous 3 to 5 Hz theta-delta slowing. Hyperventilation and photic stimulation were not performed.     ABNORMALITY - Continuous slow, generalized   IMPRESSION: This study is suggestive moderate diffuse encephalopathy, nonspecific etiology.  No seizures or epileptiform discharges were seen throughout the recording.   Tara [name]

## 2022-05-15 NOTE — Progress Notes (Signed)
Unavailable for EEG. PT in room. Will attempt later when schedule permits.

## 2022-05-15 NOTE — Progress Notes (Signed)
PROGRESS NOTE    Tara [name]  ZOX:[id number] DOB: 07-23-1946 DOA: 03/15/2022 PCP: [name], MD  Chief Complaint  Patient presents with   Seizures    Brief Narrative:  75 year old African-American female PMH of anxiety, depression, type II DM, HLD, PAF, on Eliquis, CKD 3B, breast cancer SP chemoradiation presented to hospital on 6/24 with new onset seizures. Known history of sphenoidal meningioma that was noted to be increased in size on the CT scan with surrounding edema so she is transferred from [facility] for further evaluation.  She underwent LTM with no further seizures and was evaluated by neurosurgery and underwent bifrontal craniotomy for meningioma resection on 03/24/2020 and was transferred to the intensive care unit postoperatively with PCCM consulting while she was in ICU. 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  Treated with Unasyn for 5 days. 7/11 recurrent sz like activity. Not correlated on EEG. CT unchanged.  7/13 neurology signed off. 7/19 started on ABX for sepsis then transition to Unasyn. 7/21 palliative care was consulted. 7/23 CT chest and abdomen was performed negative for PE or significant pneumonia, without any hemorrhage 7/25 MRI brain without contrast shows increasing mass effect, SDH as well as subacute/acute small scattered infarct. 7/26 tube feeds resumed, neurology reconsulted.  Eliquis resumed per neurosurgery 7/27 oxygenation improving, mentation improving, neurology following.  CTA head and neck without any LVO.  Lower extremity Doppler without any DVT.  Echo shows new systolic dysfunction with global hypokinesis EF 40 to 45% and significant RV failure. 7/30 had another aspiration event.  Tube feeds on hold. Now on NRB. Remains full code. No tracheostomy per husband. Per Neurosurgery, give at least two weeks if not more to see if she improves.  8/1 oxygenation improving again to 5 L.  Trickle tube feeds started. 8/9 on D1 honey thick liquid diet.  Calorie count initiated. 8/13 oxygenation improving to 2 L. 8/14 core track removed nocturnal tube feeds stopped. 8/15 leukocytosis worsening, abdomen distended, x-ray abdomen shows evidence of possible sigmoid volvulus, GI consulted, CT abdomen negative for any volvulus and only shows redundant sigmoid colon.  Will resume diet. 8/16-8/22: leukocytosis of unknown etiology.    Assessment & Plan:   Principal Problem:   Seizure ([place]) Active Problems:   Meningioma, cerebral (HCC)   Leukocytosis   Lethargy   Acute on chronic combined systolic and diastolic CHF (congestive heart failure) (HCC)   Acute respiratory failure with hypoxia (HCC)   Hypertension   Anxiety and depression   GERD   Hyperlipidemia   Hypokalemia   AKI (acute kidney injury) ([place])   Atrial fibrillation, chronic (HCC)   Protein-calorie malnutrition, moderate (HCC)   Meningioma (HCC)   Pressure injury of skin   Aspiration into airway   Palliative care encounter   Dysfunction of right cardiac ventricle   Acute postoperative anemia due to expected blood loss   Dysphagia   Assessment and Plan: * Seizure ([place]) Continue Keppra Will repeat EEG today, low suspicion PM&R (8/24 note) recommending considering decreasing keppra?, will defer to nsgy, may need to discuss with neurology  Meningioma, cerebral [facility]) Per neurosurgery  Lethargy PM&R recommending considering ritalin 5 mg twice daily  I discussed informally with cards, in absence of RVR and decompensated HF can consider this -  will discuss with husband prior to starting  Leukocytosis Recurrent. Know specific infectious source identified although patient has been recently treated for recurrent  aspiration pneumonia. Recently completed course of unasyn 8/3 CXR 8/19 concerning for edema vs pneumia Will repeat CXR  Acute respiratory failure with hypoxia ([place]) Secondary to pneumonia with associated tachypnea. Hypoxia improved but not resolved. Repeat chest x-ray with edema vs pneumonia. Patient with leukocytosis and no other infectious symptoms. Down to 2 L/min with Lasix. -Wean as able -repeat CXR with low lung volumes, no acute abnormality  Acute on chronic combined systolic and diastolic CHF (congestive heart failure) (HCC) Most recent Transthoracic Echocardiogram with LVEF of 40-45% (down from 55%) with global hypokinesis and associated RV dysfunction noted. Patient with acute heart failure this admission that responded to Lasix. Currently resolved. Not on GDMT secondary to recent hypotension. -Continue Lasix  Hypertension - Continue lasix, currently on midodrine  Anxiety and depression Previously on Zoloft and Klonopin which are discontinued.  GERD -Continue Protonix  Atrial fibrillation, chronic (HCC) Patient is on rate/rhythm control and on anticoagulation. -Continue amiodarone and Eliquis  AKI (acute kidney injury) ([place]) Possibly secondary to lasix although now has stabilized. Weight appears to have downtrended from admission but is stable for the past week.  Hypokalemia Replace and follow  Hyperlipidemia -Continue Crestor  Dysphagia Previously with Asmaa Tirpak Cortrak and tube feedings. Evaluated/followed by speech therapy. -Speech therapy recommendations (8/15): Diet recommendations: Dysphagia 2 (fine chop);Honey-thick liquid Liquids provided via: Cup;Straw Medication Administration: Crushed with puree Supervision: Full supervision/cueing for compensatory strategies;Staff to assist with self feeding Compensations: Slow rate;Small sips/bites;Multiple dry swallows after each bite/sip;Clear throat intermittently Postural Changes and/or Swallow Maneuvers: Seated upright  90 degrees  Acute postoperative anemia due to expected blood loss Acute anemia down to 6.5 requiring 2 units of PRBC. Hemoglobin currently stable.  Dysfunction of right cardiac ventricle Acute/chronic PE ruled out. Possibly related to pneumonia. Recommendation for repeat Transthoracic Echocardiogram in 6 months. Patient can follow-up with pulmonology.  Hypernatremia-resolved as of 05/07/2022 Resolved.  Thrombocytopenia (HCC)-resolved as of 05/07/2022 Resolved.      DVT prophylaxis: eliquis Code Status: full Family Communication: husband at bedside Disposition:   Status is: Inpatient Remains inpatient appropriate because: per primary   Consultants:  Neurosurgery Neurology Pccm Palliative care GI  Procedures:  See significant events above  Antimicrobials:  Anti-infectives (From admission, onward)    Start     Dose/Rate Route Frequency Ordered Stop   04/21/22 0000  Ampicillin-Sulbactam (UNASYN) 3 g in sodium chloride 0.9 % 100 mL IVPB        3 g 200 mL/hr over 30 Minutes Intravenous Every 8 hours 04/20/22 1913 04/25/22 0018   04/12/22 1500  Ampicillin-Sulbactam (UNASYN) 3 g in sodium chloride 0.9 % 100 mL IVPB  Status:  Discontinued        3 g 200 mL/hr over 30 Minutes Intravenous Every 8 hours 04/12/22 1409 04/20/22 1913   04/11/22 1845  Ampicillin-Sulbactam (UNASYN) 3 g in sodium chloride 0.9 % 100 mL IVPB  Status:  Discontinued        3 g 200 mL/hr over 30 Minutes Intravenous Every 8 hours 04/11/22 1754 04/12/22 1409   04/10/22 1200  vancomycin (VANCOREADY) IVPB 750 mg/150 mL  Status:  Discontinued        750 mg 150 mL/hr over 60 Minutes Intravenous Every 24 hours 04/09/22 1034 04/11/22 1100   04/10/22 0900  metroNIDAZOLE (FLAGYL) IVPB 500 mg  Status:  Discontinued  500 mg 100 mL/hr over 60 Minutes Intravenous Every 12 hours 04/10/22 0754 04/11/22 1754   04/09/22 2300  ceFEPIme (MAXIPIME) 2 g in sodium chloride 0.9 % 100 mL IVPB  Status:  Discontinued         2 g 200 mL/hr over 30 Minutes Intravenous Every 12 hours 04/09/22 1035 04/11/22 1717   04/09/22 1045  vancomycin (VANCOREADY) IVPB 1750 mg/350 mL        1,750 mg 175 mL/hr over 120 Minutes Intravenous  Once 04/09/22 0953 04/09/22 1344   04/09/22 1045  ceFEPIme (MAXIPIME) 2 g in sodium chloride 0.9 % 100 mL IVPB        2 g 200 mL/hr over 30 Minutes Intravenous  Once 04/09/22 0953 04/09/22 1524   03/27/22 0945  Ampicillin-Sulbactam (UNASYN) 3 g in sodium chloride 0.9 % 100 mL IVPB        3 g 200 mL/hr over 30 Minutes Intravenous Every 6 hours 03/27/22 0859 04/01/22 0518   03/25/22 0600  ceFAZolin (ANCEF) IVPB 2g/100 mL premix        2 g 200 mL/hr over 30 Minutes Intravenous On call to O.R. 03/24/22 1215 03/24/22 1440       Subjective: Answers some questions appropriately, other times repeats what I've said  Inconsistnet Husband at bedside  Objective: Vitals:   05/14/22 2300 05/15/22 0348 05/15/22 0749 05/15/22 1145  BP:  104/65 (!) 91/55 111/75  Pulse: 88 90 93 92  Resp: '17 20 20 '$ (!) 22  Temp:  98 F (36.7 C) 98.7 F (37.1 C) 98.4 F (36.9 C)  TempSrc:  Oral Oral Oral  SpO2: 96% 96% 96% 92%  Weight:      Height:        Intake/Output Summary (Last 24 hours) at 05/15/2022 1345 Last data filed at 05/14/2022 1924 Gross per 24 hour  Intake --  Output 1000 ml  Net -1000 ml   Filed Weights   05/03/22 0345 05/04/22 0413 05/06/22 0500  Weight: 73.6 kg 74.7 kg 74.7 kg    Examination:  General: No acute distress. Cardiovascular: RRR Lungs: unlabored Abdomen: Soft, nontender, nondistended  Neurological: lethargic, lip smacking, echolalia at times - inconsistently following commands Extremities: No clubbing or cyanosis. No edema.   Data Reviewed: I have personally reviewed following labs and imaging studies  CBC: Recent Labs  Lab 05/10/22 0117 05/11/22 0336 05/12/22 0620 05/13/22 1022 05/14/22 1048 05/15/22 0555  WBC 20.7* 19.7* 19.4* 18.5* 18.7* 17.7*   NEUTROABS 15.4*  --   --   --  14.3* 14.0*  HGB 9.1* 9.1* 9.6* 9.8* 9.6* 9.2*  HCT 28.0* 28.6* 30.4* 30.6* 29.7* 28.6*  MCV 85.1 87.2 86.6 86.0 87.4 85.1  PLT 270 243 256 278 260 518    Basic Metabolic Panel: Recent Labs  Lab 05/09/22 0230 05/10/22 0117 05/12/22 0620 05/13/22 1022 05/14/22 1048 05/15/22 0555  NA 139 140 140  --  141 140  K 3.1* 3.7 3.2* 4.1 4.0 3.8  CL 101 106 111  --  109 107  CO2 '25 23 23  '$ --  25 24  GLUCOSE 125* 124* 112*  --  111* 112*  BUN 28* 28* 23  --  22 22  CREATININE 1.36* 1.16* 0.98  --  1.10* 1.03*  CALCIUM 8.9 8.9 8.7*  --  8.7* 8.8*  MG  --   --   --   --   --  1.8  PHOS  --   --   --   --   --  4.6    GFR: Estimated Creatinine Clearance: 38.9 mL/min ([place] Imel) (by C-G formula based on SCr of 1.03 mg/dL (H)).  Liver Function Tests: Recent Labs  Lab 05/14/22 1048 05/15/22 0555  AST 28 27  ALT 33 34  ALKPHOS 89 92  BILITOT 0.3 0.4  PROT 6.3* 6.6  ALBUMIN 2.6* 2.7*    CBG: No results for input(s): "GLUCAP" in the last 168 hours.   No results found for this or any previous visit (from the past 240 hour(s)).       Radiology Studies: DG CHEST PORT 1 VIEW  Result Date: 05/14/2022 CLINICAL DATA:  Leukocytosis. EXAM: PORTABLE CHEST 1 VIEW COMPARISON:  05/10/2022, CT 04/13/2022. FINDINGS: Low lung volumes. Stable heart size and mediastinal contours. Calcified right-sided adenopathy consistent with prior granulomatous disease. Improved left perihilar opacities from prior exam. No acute consolidation. No pleural effusion or pneumothorax. Stable osseous structures. Surgical clips in the right axilla. IMPRESSION: Low lung volumes. No acute abnormality. Improved left perihilar opacities from prior exam. Electronically Signed   By: Keith Rake M.D.   On: 05/14/2022 18:06   DG Abd Portable 1V  Result Date: 05/13/2022 CLINICAL DATA:  Distended abdomen EXAM: PORTABLE ABDOMEN - 1 VIEW COMPARISON:  05/06/2022 FINDINGS: Normal bowel gas pattern.  No dilated bowel loops. Surgical clips right upper quadrant. No urinary tract calculi. IMPRESSION: Normal bowel gas pattern. Electronically Signed   By: Franchot Gallo M.D.   On: 05/13/2022 16:09        Scheduled Meds:  amiodarone  200 mg Oral Daily   apixaban  5 mg Oral BID   budesonide (PULMICORT) nebulizer solution  0.25 mg Nebulization BID   furosemide  40 mg Oral QODAY   lacosamide  200 mg Oral BID   levETIRAcetam  1,000 mg Oral BID   midodrine  5 mg Oral BID WC   montelukast  10 mg Oral QHS   multivitamin with minerals  1 tablet Oral Daily   mouth rinse  15 mL Mouth Rinse 4 times per day   pantoprazole  40 mg Oral BID   rosuvastatin  20 mg Oral Daily   senna-docusate  1 tablet Oral BID   Continuous Infusions:   LOS: 61 days    Time spent: over 30 min    [name], MD [facility]   To contact the attending provider between 7A-7P or the covering provider during after hours 7P-7A, please log into the web site [email or web address] and access using universal [place] password for that web site. If you do not have the password, please call the hospital operator.  05/15/2022, 1:45 PM

## 2022-05-15 NOTE — Consult Note (Signed)
Physical Medicine and Rehabilitation Consult Reason for Consult:slow functional progress      HPI: Tara [name] is a 76 y.o. African-American female with history of anxiety and depression as well as diabetes, proximal atrial fibrillation and chronic kidney disease in addition to breast cancer and known history of sphenoidal meningioma, who was admitted to the hospital on 03/15/2022 with new onset seizures.  MRI on that date demonstrated longstanding planum sphenoidal meningioma with slow growth with new vasogenic edema in the adjacent left frontal lobe.  Patient was loaded with Keppra and ultimately underwent a bifrontal craniotomy with resection of meningioma by Dr. Christella Noa.  Follow-up head CT on 03/26/2022 demonstrated bifrontal craniotomy site with a small amount of subdural hematoma along the anterior falx as well as subdural hematomas tracking along the convexities.  Also noted was a ?left frontal intraparenchymal hematoma and new 7 mm subdural hematoma at the foramen magnum which was not causing any critical stenosis.  Course also complicated by sepsis.  MRI of the brain on 7/25 demonstrated postoperative findings of meningioma resection with slightly increased edema in the right inferior frontal lobe, subdural hemorrhage narrowing the foramen magnum with possible mass effect, as well as subacute/acute small scattered infarcts in the bilateral cerebral hemispheres as well as left cerebellum.  On 04/20/2022 patient had a aspiration event.  Tube feeds were on hold and then again resumed.  Her tube feeds and cor trak tube were ultimately stopped  on 05/05/2022.  She is also had a leukocytosis of unknown etiology.  Patient has demonstrated slow progress from a neurological and functional standpoint.  She has been working regularly with PT, OT and speech therapies.  She has demonstrated delays in initiation and processing.  She still requiring max to total assistance for basic bed mobility and sit to  stand transfers.  She has demonstrated hypertonicity which is also interfered with her movements.  Her husband has been present throughout her stay and reports that she has been showing some signs of increased engagement in arousal.  She seemed to do well around him and family in particular.   Review of Systems  Unable to perform ROS: Mental acuity  Genitourinary:  Negative for frequency.   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   Anxiety    Breast cancer ([place]) 2008   right - s/p lumpectomy->chemo, radiation   Depression    Dysrhythmia    hx SVT   Family history of colon cancer    Family history of prostate cancer    GERD (gastroesophageal reflux disease)    H/O: hysterectomy    Hiatal hernia    History of cancer chemotherapy    History of radiation therapy    Hyperlipidemia    Hypertension 01/25/2018   Nonischemic cardiomyopathy ([place])    Personal history of radiation therapy 01/05/209   SVT (supraventricular tachycardia) ([facility])    short RP SVT documented 1/61   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[place])    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)   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   Past Surgical History:  Procedure Laterality Date   ABDOMINAL HYSTERECTOMY  1994   fibroids,    BIOPSY  06/19/2016   Procedure: BIOPSY;  Surgeon: Daneil Dolin, MD;  [facility];  Service: Endoscopy;;  gastric duodenum  BREAST BIOPSY Right 12/16/2006   malignant   BREAST EXCISIONAL BIOPSY Right 2018   benign lumpectomy   BREAST LUMPECTOMY Right 02/2007   BREAST LUMPECTOMY WITH RADIOACTIVE SEED LOCALIZATION Right 07/29/2017   Procedure: RIGHT BREAST LUMPECTOMY WITH RADIOACTIVE SEED LOCALIZATION;  Surgeon: Fanny Skates, MD;  Location: [place];  Service: General;  Laterality: Right;   BREAST SURGERY Right 2008   lumpectomy, cancer   CARDIAC CATHETERIZATION      CHOLECYSTECTOMY  1999   COLONOSCOPY  2008   Dr. Oneida Alar: multiple polyps. Path not available at time of visit.    COLONOSCOPY WITH PROPOFOL N/A 04/25/2014   Dr. Oneida Alar: Multiple tubular adenomas removed. Diverticulosis. Moderate internal hemorrhoids. Next colonoscopy planned for August 2018.   CRANIOTOMY Bilateral 03/24/2022   Procedure: Bifrontal craniotomy for resection of meningioma;  Surgeon: Ashok Pall, MD;  Location: [place];  Service: Neurosurgery;  Laterality: Bilateral;   ESOPHAGOGASTRODUODENOSCOPY (EGD) WITH PROPOFOL N/A 06/19/2016   Procedure: ESOPHAGOGASTRODUODENOSCOPY (EGD) WITH PROPOFOL;  Surgeon: Daneil Dolin, MD;  [facility];  Service: Endoscopy;  Laterality: N/A;   MASTECTOMY W/ SENTINEL NODE BIOPSY Right 06/09/2018   Procedure: RIGHT TOTAL MASTECTOMY WITH SENTINEL LYMPH NODE BIOPSY;  Surgeon: Fanny Skates, MD;  Location: [place];  Service: General;  Laterality: Right;   POLYPECTOMY N/A 04/25/2014   Procedure: POLYPECTOMY;  Surgeon: Danie Binder, MD;  Location: [facility];  Service: Endoscopy;  Laterality: N/A;  Ascending and Decending Colon x3 , Transverse colon x2, rectal   PORTACATH PLACEMENT N/A 06/09/2018   Procedure: INSERTION PORT-A-CATH;  Surgeon: Fanny Skates, MD;  Location: [place];  Service: General;  Laterality: N/A;   RIGHT/LEFT HEART CATH AND CORONARY ANGIOGRAPHY N/A 07/10/2017   Procedure: RIGHT/LEFT HEART CATH AND CORONARY ANGIOGRAPHY;  Surgeon: Larey Dresser, MD;  Location: [place] [facility];  Service: Cardiovascular;  Laterality: N/A;   Family History  Problem Relation Age of Onset   Colon cancer Father 80       died at age 41   Hypertension Sister    Heart disease Sister    Cancer Sister        unknown form   Dementia Mother    Stroke Mother 32       left hemiparesis   Cancer Brother 38       prostate   Cancer Paternal Aunt        NOS   Lung cancer Paternal Uncle    Other Paternal Uncle        lightning strike   Other Paternal Uncle         hit by train   Other Paternal 68        old age   56 Cousin        NOS pat first cousin   Cancer Cousin        NOS pat first cousin   Prostate cancer Cousin        pat first cousin   Social History:  reports that she has never smoked. She has never used smokeless tobacco. She reports that she does not drink alcohol and does not use drugs. Allergies:  Allergies  Allergen Reactions   Aspirin Other (See Comments)    Reports GI bleed--pt is currently taking   Lisinopril Cough   Sudafed [Pseudoephedrine Hcl]     Pt reports she had drainage in throat that made her throat hurt.    Medications Prior to Admission  Medication Sig Dispense Refill   acetaminophen (  TYLENOL) 500 MG tablet Take 500-1,000 mg by mouth every 6 (six) hours as needed (FOR PAIN.).     amiodarone (PACERONE) 200 MG tablet Take 1 tablet (200 mg total) by mouth daily. 30 tablet 0   apixaban (ELIQUIS) 2.5 MG TABS tablet Take 1 tablet (2.5 mg total) by mouth 2 (two) times daily. 60 tablet 0   azelastine (ASTELIN) 0.1 % nasal spray Place 1 spray into both nostrils daily as needed for rhinitis. Use in each nostril as directed     Cholecalciferol (VITAMIN D) 50 MCG (2000 UT) CAPS Take 2,000 Units by mouth daily.     clonazePAM (KLONOPIN) 0.5 MG tablet Take 1 tablet (0.5 mg total) by mouth at bedtime. 30 tablet 5   dexamethasone (DECADRON) 0.5 MG tablet Take 2 tabs for 3 days, then 1 tab for 3 days then stop. 10 tablet 0   docusate sodium (COLACE) 100 MG capsule Take 200 mg by mouth 2 (two) times daily as needed for mild constipation.     JARDIANCE 10 MG TABS tablet TAKE ONE TABLET BY MOUTH ONCE DAILY. (Patient taking differently: Take 10 mg by mouth daily.) 90 tablet 3   montelukast (SINGULAIR) 10 MG tablet TAKE ONE TABLET BY MOUTH AT BEDTIME. (Patient taking differently: Take 10 mg by mouth at bedtime.) 90 tablet 1   pantoprazole (PROTONIX) 40 MG tablet TAKE (1) TABLET BY MOUTH TWICE DAILY. (Patient taking  differently: 40 mg 2 (two) times daily.) 180 tablet 0   Phenylephrine-Witch Hazel (PREPARATION H) 0.25-50 % GEL Apply 1 Application topically daily as needed (hemorrhoid).     rosuvastatin (CRESTOR) 20 MG tablet Take 1 tablet (20 mg total) by mouth daily. 30 tablet 5   sertraline (ZOLOFT) 25 MG tablet Take 1 tablet (25 mg total) by mouth daily. 30 tablet 5    Home: Home Living Family/patient expects to be discharged to:: Private residence Living Arrangements: Spouse/significant other Available Help at Discharge: Family, Available PRN/intermittently Type of Home: House Home Access: Ramped entrance Entrance Stairs-Number of Steps: 1 Entrance Stairs-Rails: Right, Left Home Layout: Two level, Laundry or work area in basement Alternate Therapist, sports of Steps: flight Alternate Level Stairs-Rails: Right, Left Bathroom Shower/Tub: Other (comment) (walk in tub) Bathroom Toilet: Standard Bathroom Accessibility: Yes Home Equipment: Rollator (4 wheels), Grab bars - tub/shower, Conservation officer, nature (2 wheels), BSC/3in1, Wheelchair - manual, Hospital bed  Lives With: Spouse  Functional History: Prior Function Prior Level of Function : Independent/Modified Independent, Needs assist Physical Assist : Mobility (physical), ADLs (physical) Mobility (physical): Transfers, Gait ADLs (physical): Grooming, Bathing, Dressing, Toileting Mobility Comments: Husband providing assistance with all mobility, usually x1 HHA only with short gait bouts. Husband had been having pt ambulate every 2 hours after recent d/c. ADLs Comments: husband assisting with all ADL tasks with the exception of feeding; recent incontinence Functional Status:  Mobility: Bed Mobility Overal bed mobility: Needs Assistance Bed Mobility: Rolling, Supine to Sit, Sit to Supine Rolling: Max assist Sidelying to sit: Total assist Supine to sit: Total assist, HOB elevated, +2 for physical assistance Sit to supine: Total assist, +2 for  physical assistance, HOB elevated Sit to sidelying: Independent General bed mobility comments: total A +2 for all aspects of bed mobility with pt needing MAX A to roll R<>L for pad change Transfers Overall transfer level: Needs assistance Equipment used: 2 person hand held assist Transfers: Sit to/from Stand Sit to Stand: Max assist, +2 physical assistance, +2 safety/equipment Bed to/from chair/wheelchair/BSC transfer type:: Squat pivot Stand pivot transfers:  Total assist Squat pivot transfers: Total assist Step pivot transfers: Min assist  Lateral/Scoot Transfers: Total assist, +2 physical assistance Transfer via Lift Equipment: [place] transfer comment: pt completed x2 sit>stands from EOB with MAX A +2 pt with difficulty shifting hips anteriorly and elevating trunk into standing. total A for weight shift for pre-gait activity Ambulation/Gait Ambulation/Gait assistance: Mod assist, +2 safety/equipment Gait Distance (Feet): 45 Feet Assistive device: Rolling walker (2 wheels) Gait Pattern/deviations: Decreased stride length, Shuffle, Wide base of support (L lateral lean) General Gait Details: unable to manage, total A to maintain static stance Gait velocity: very slow Gait velocity interpretation: <1.8 ft/sec, indicate of risk for recurrent falls    ADL: ADL Overall ADL's : Needs assistance/impaired Eating/Feeding: NPO Grooming: Oral care, Maximal assistance, Total assistance Grooming Details (indicate cue type and reason): more alert during today's attempt at hand over hand. Pt initiating RUE arm movement today. Assist to stabilized at R elbow and to guide movements to access mouth. Provided builtup material for oral care stick (rolled washcloth). Upper Body Bathing: Total assistance Upper Body Bathing Details (indicate cue type and reason): to wash back Lower Body Bathing: Total assistance, Bed level Lower Body Bathing Details (indicate cue type and reason): simulated via  posterior pericare Upper Body Dressing : Total assistance Lower Body Dressing: Total assistance, Bed level Toilet Transfer: Total assistance, +2 for physical assistance (sit>stand only from EOB) Toilet Transfer Details (indicate cue type and reason): simulated from bed>chair Toileting- Clothing Manipulation and Hygiene: Total assistance, Bed level Toileting - Clothing Manipulation Details (indicate cue type and reason): incontinent Functional mobility during ADLs: Total assistance, +2 for safety/equipment, +2 for physical assistance General ADL Comments: ADL particpation limited by impaired cog,impaired balance, decreased level of arousal, and  impaired attention  Cognition: Cognition Overall Cognitive Status: Impaired/Different from baseline Arousal/Alertness:  (waxes and wanes) Orientation Level: Oriented to person Attention: Sustained Sustained Attention: Impaired Sustained Attention Impairment: Verbal basic Memory:  (TBD) Awareness: Impaired Awareness Impairment: Intellectual impairment, Emergent impairment, Anticipatory impairment Problem Solving: Impaired Problem Solving Impairment: Functional basic Safety/Judgment: Impaired Cognition Arousal/Alertness: Awake/alert (lethargic initially, but aroused with mobility) Behavior During Therapy: Flat affect Overall Cognitive Status: Impaired/Different from baseline Area of Impairment: Attention, Following commands, Problem solving Orientation Level: Disoriented to, Time, Situation Current Attention Level: Focused Memory: Decreased short-term memory, Decreased recall of precautions Following Commands: Follows one step commands with increased time, Follows one step commands inconsistently Safety/Judgement: Decreased awareness of deficits, Decreased awareness of safety Awareness: Intellectual Problem Solving: Slow processing, Decreased initiation, Difficulty sequencing, Requires verbal cues, Requires tactile cues General Comments:  responding to yes/no questions mostly flat during session, did arouse more as session progressed able to keep eyes open and follow commands Difficult to assess due to: Level of arousal  Blood pressure (!) 91/55, pulse 93, temperature 98.7 F (37.1 C), temperature source Oral, resp. rate 20, height '4\' 9"'$  (1.448 m), weight 74.7 kg, SpO2 96 %. Physical Exam Constitutional:      Appearance: She is obese.  HENT:     Head:     Comments: Bifrontal crani scar which CDI, healing.     Nose: Nose normal.     Mouth/Throat:     Mouth: Mucous membranes are moist.  Eyes:     Extraocular Movements: Extraocular movements intact.     Conjunctiva/sclera: Conjunctivae normal.     Pupils: Pupils are equal, round, and reactive to light.  Cardiovascular:     Rate and Rhythm: Normal rate and regular rhythm.  Heart sounds: No murmur heard. Pulmonary:     Effort: Pulmonary effort is normal. No respiratory distress.     Breath sounds: No wheezing.  Abdominal:     General: Bowel sounds are normal. There is distension.  Musculoskeletal:        General: Tenderness (bilateral legs with PROM) present. No swelling.     Cervical back: Normal range of motion.  Skin:    General: Skin is warm and dry.  Neurological:     Comments: Patient is alert.  She was watching television when I came in.  Her eyes stayed fixed on the television.  She told me it was her birthday but could not tell me the month of her birthday.  She was able to express the day of her birthday which was correct. Speech is dysarthric.  She struggled following any structured commands or sequences.  He did provide some effort when I asked her to squeeze my hand, especially with the right hand.  But effort as a whole was inconsistent with any type of motor testing.  The patient ultimately told me thank you when I left.   Patient demonstrates significant flexor tone in the left upper extremity greater than right upper extremity.  She had difficulty  initiating movements with perhaps some apraxia as well.  She seemed to have at least 2 out of 5 strength in the right upper extremity, perhaps 3 out of 5, but this some time was inhibited by her flexor tone. Pt demonstrates significant extensor tone in her lower extremities R>L. She could not initiate any volitional movement today. DTR's are 2-3+ all 4's. Seemed to sense pain to some extent in all 4 limbs.      Results for orders placed or performed during the hospital encounter of 03/15/22 (from the past 24 hour(s))  CBC with Differential/Platelet     Status: Abnormal   Collection Time: 05/14/22 10:48 AM  Result Value Ref Range   WBC 18.7 (H) 4.0 - 10.5 K/uL   RBC 3.40 (L) 3.87 - 5.11 MIL/uL   Hemoglobin 9.6 (L) 12.0 - 15.0 g/dL   HCT 29.7 (L) 36.0 - 46.0 %   MCV 87.4 80.0 - 100.0 fL   MCH 28.2 26.0 - 34.0 pg   MCHC 32.3 30.0 - 36.0 g/dL   RDW 17.7 (H) 11.5 - 15.5 %   Platelets 260 150 - 400 K/uL   nRBC 0.0 0.0 - 0.2 %   Neutrophils Relative % 76 %   Neutro Abs 14.3 (H) 1.7 - 7.7 K/uL   Lymphocytes Relative 10 %   Lymphs Abs 1.9 0.7 - 4.0 K/uL   Monocytes Relative 8 %   Monocytes Absolute 1.4 (H) 0.1 - 1.0 K/uL   Eosinophils Relative 4 %   Eosinophils Absolute 0.7 (H) 0.0 - 0.5 K/uL   Basophils Relative 1 %   Basophils Absolute 0.1 0.0 - 0.1 K/uL   Immature Granulocytes 1 %   Abs Immature Granulocytes 0.25 (H) 0.00 - 0.07 K/uL  Comprehensive metabolic panel     Status: Abnormal   Collection Time: 05/14/22 10:48 AM  Result Value Ref Range   Sodium 141 135 - 145 mmol/L   Potassium 4.0 3.5 - 5.1 mmol/L   Chloride 109 98 - 111 mmol/L   CO2 25 22 - 32 mmol/L   Glucose, Bld 111 (H) 70 - 99 mg/dL   BUN 22 8 - 23 mg/dL   Creatinine, Ser 1.10 (H) 0.44 - 1.00 mg/dL   Calcium  8.7 (L) 8.9 - 10.3 mg/dL   Total Protein 6.3 (L) 6.5 - 8.1 g/dL   Albumin 2.6 (L) 3.5 - 5.0 g/dL   AST 28 15 - 41 U/L   ALT 33 0 - 44 U/L   Alkaline Phosphatase 89 38 - 126 U/L   Total Bilirubin 0.3 0.3 - 1.2  mg/dL   GFR, Estimated 52 (L) >60 mL/min   Anion gap 7 5 - 15  CBC with Differential/Platelet     Status: Abnormal   Collection Time: 05/15/22  5:55 AM  Result Value Ref Range   WBC 17.7 (H) 4.0 - 10.5 K/uL   RBC 3.36 (L) 3.87 - 5.11 MIL/uL   Hemoglobin 9.2 (L) 12.0 - 15.0 g/dL   HCT 28.6 (L) 36.0 - 46.0 %   MCV 85.1 80.0 - 100.0 fL   MCH 27.4 26.0 - 34.0 pg   MCHC 32.2 30.0 - 36.0 g/dL   RDW 17.7 (H) 11.5 - 15.5 %   Platelets 257 150 - 400 K/uL   nRBC 0.0 0.0 - 0.2 %   Neutrophils Relative % 79 %   Neutro Abs 14.0 (H) 1.7 - 7.7 K/uL   Lymphocytes Relative 10 %   Lymphs Abs 1.8 0.7 - 4.0 K/uL   Monocytes Relative 7 %   Monocytes Absolute 1.2 (H) 0.1 - 1.0 K/uL   Eosinophils Relative 3 %   Eosinophils Absolute 0.5 0.0 - 0.5 K/uL   Basophils Relative 0 %   Basophils Absolute 0.1 0.0 - 0.1 K/uL   Immature Granulocytes 1 %   Abs Immature Granulocytes 0.15 (H) 0.00 - 0.07 K/uL  Comprehensive metabolic panel     Status: Abnormal   Collection Time: 05/15/22  5:55 AM  Result Value Ref Range   Sodium 140 135 - 145 mmol/L   Potassium 3.8 3.5 - 5.1 mmol/L   Chloride 107 98 - 111 mmol/L   CO2 24 22 - 32 mmol/L   Glucose, Bld 112 (H) 70 - 99 mg/dL   BUN 22 8 - 23 mg/dL   Creatinine, Ser 1.03 (H) 0.44 - 1.00 mg/dL   Calcium 8.8 (L) 8.9 - 10.3 mg/dL   Total Protein 6.6 6.5 - 8.1 g/dL   Albumin 2.7 (L) 3.5 - 5.0 g/dL   AST 27 15 - 41 U/L   ALT 34 0 - 44 U/L   Alkaline Phosphatase 92 38 - 126 U/L   Total Bilirubin 0.4 0.3 - 1.2 mg/dL   GFR, Estimated 56 (L) >60 mL/min   Anion gap 9 5 - 15  Magnesium     Status: None   Collection Time: 05/15/22  5:55 AM  Result Value Ref Range   Magnesium 1.8 1.7 - 2.4 mg/dL  Phosphorus     Status: None   Collection Time: 05/15/22  5:55 AM  Result Value Ref Range   Phosphorus 4.6 2.5 - 4.6 mg/dL   DG CHEST PORT 1 VIEW  Result Date: 05/14/2022 CLINICAL DATA:  Leukocytosis. EXAM: PORTABLE CHEST 1 VIEW COMPARISON:  05/10/2022, CT 04/13/2022.  FINDINGS: Low lung volumes. Stable heart size and mediastinal contours. Calcified right-sided adenopathy consistent with prior granulomatous disease. Improved left perihilar opacities from prior exam. No acute consolidation. No pleural effusion or pneumothorax. Stable osseous structures. Surgical clips in the right axilla. IMPRESSION: Low lung volumes. No acute abnormality. Improved left perihilar opacities from prior exam. Electronically Signed   By: Keith Rake M.D.   On: 05/14/2022 18:06   DG Abd Portable 1V  Result Date: 05/13/2022 CLINICAL DATA:  Distended abdomen EXAM: PORTABLE ABDOMEN - 1 VIEW COMPARISON:  05/06/2022 FINDINGS: Normal bowel gas pattern. No dilated bowel loops. Surgical clips right upper quadrant. No urinary tract calculi. IMPRESSION: Normal bowel gas pattern. Electronically Signed   By: Franchot Gallo M.D.   On: 05/13/2022 16:09    Assessment:  Anterior skull base/sphenoid meningioma s/p craniotomy and resection with post-op edema, SDH. Bilateral scattered cerebral infarcts, likely embolic due to AF, now back on eliquis Significant upper extremity flexor tone and lower extremity extensor tone Severely impaired initiation and processing related to frontal lobe involvement.   Plan/recs: Pt would benefit from a stimulant such as ritalin to improve her initiation and to an extent her arousal. My only concern in starting a stimulant would be her cardiac hx which includes AF and cardiomyopathy. If she is cleared by cards/IM to start ritalin, I would recommend '5mg'$  daily at 0700 and 1200. She needs bilateral resting wrist hand orthoses to help maintain positioning of her fingers and wrists. Her left hand/wrist in particular is affected Recommend a night splint for left heel cord contracture. Maintain current PRAFO LLE.  I understand that she needs seizure prophylaxis, but is there an opportunity to decrease her keppra at all? Her dose is fairly robust, and I've found keppra at  this dosage to cause significant neuro-sedating side effects in patients of her age and with this diagnosis.  From a rehab standpoint, she remains most appropriate for SNF. She is nowhere near being able to tolerate 3 hours per day of therapy. Certainly, we could consider inpatient rehab if she demonstrates progress at the SNF. For now, continue with therapy on acute to advance swallowing and basic transfers and mobility. Additional therapy focus should be wrist/hand and ankle ROM, contracture prevention.    I spoke with her husband about my observations and recommendations. He expressed and understanding. Please let me know if you have any further questions.   Meredith Staggers, MD, [place] Director Rehabilitation Services 05/15/2022   Meredith Staggers, MD 05/15/2022

## 2022-05-15 NOTE — Progress Notes (Signed)
RT came by to give neb treatment but pt was working with speech therapy.

## 2022-05-15 NOTE — Assessment & Plan Note (Addendum)
PM&R recommending considering ritalin 5 mg twice daily  I discussed informally with cards, in absence of RVR and decompensated HF can consider this - will discuss with husband and daughter rachel prior to starting

## 2022-05-15 NOTE — Progress Notes (Signed)
EEG complete - results pending 

## 2022-05-15 NOTE — Progress Notes (Signed)
Nutrition Follow-up  DOCUMENTATION CODES:   Obesity unspecified  INTERVENTION:   Encourage PO intake   Continue:  Magic cup with all meals  Thickened milk and juice with all meals  Please re-consult as needed   NUTRITION DIAGNOSIS:   Inadequate oral intake related to dysphagia as evidenced by  (altered texture diet; meal completion <75%). Progressing.   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  Pt continues to make progress with therapy. PO intake recorded at >75% of all meals  Therapy recommends SNF.  Spoke with husband who reports great PO intake, always eats 100% of Breakfast and Lunch, he does not always feed her all of dinner as it feels too much. Uses some of the thickened beverages between meals.   Medications reviewed and include: lasix every other day, MVI with minerals, protonix, senokot   Labs reviewed   Diet Order:   Diet Order             DIET DYS 2 Room service appropriate? Yes; Fluid consistency: Honey Thick  Diet effective now                   EDUCATION NEEDS:   Not appropriate for education at this time  Skin:  Skin Assessment: Skin Integrity Issues: Skin Integrity Issues:: Stage II Stage II: buttocks  Last BM:  8/24 large; type 6  Height:   Ht Readings from Last 1 Encounters:  03/15/22 $RemoveB'4\' 9"'QOSucXXM$  (1.448 m)    Weight:   Wt Readings from Last 1 Encounters:  05/06/22 74.7 kg    BMI:  Body mass index is 35.64 kg/m.  Estimated Nutritional Needs:   Kcal:  1500-1700  Protein:  85-100 grams  Fluid:  >1.5 L/day  Lockie Pares., RD, LDN, CNSC See AMiON for contact information

## 2022-05-15 NOTE — Progress Notes (Signed)
Insurance auth for [facility] remains pending at this time. Will provide updates as available.   Wandra Feinstein, MSW, LCSW [phone] (coverage)

## 2022-05-15 NOTE — Progress Notes (Signed)
Patient ID: CHANIN FRUMKIN, female   DOB: [birth year], 76 y.o.   MRN: [id number] BP 107/61   Pulse 91   Temp 99.4 F (37.4 C) (Oral)   Resp 20   Ht '4\' 9"'$  (1.448 m)   Wt 74.7 kg   SpO2 99%   BMI 35.64 kg/m  Eeg repeated today. No seizure activity At current baseline Moving all extremities Did not respond verbally tonight Not clear what led to EEG order will discuss with treatment team.

## 2022-05-15 NOTE — Progress Notes (Signed)
Orthopedic Tech Progress Note Patient Details:  Tara [name] [birth year] [id number] Bilateral resting hand splints and RLE night splint have been ordered from [facility]  Patient ID: Tara [name], female   DOB: 02-Aug-1946, 76 y.o.   MRN: [id number]  Jearld Lesch 05/15/2022, 2:30 PM

## 2022-05-16 DIAGNOSIS — R569 Unspecified convulsions: Secondary | ICD-10-CM | POA: Diagnosis not present

## 2022-05-16 LAB — COMPREHENSIVE METABOLIC PANEL
ALT: 25 U/L (ref 0–44)
AST: 23 U/L (ref 15–41)
Albumin: 2.6 g/dL — ABNORMAL LOW (ref 3.5–5.0)
Alkaline Phosphatase: 96 U/L (ref 38–126)
Anion gap: 10 (ref 5–15)
BUN: 27 mg/dL — ABNORMAL HIGH (ref 8–23)
CO2: 22 mmol/L (ref 22–32)
Calcium: 8.7 mg/dL — ABNORMAL LOW (ref 8.9–10.3)
Chloride: 107 mmol/L (ref 98–111)
Creatinine, Ser: 1.1 mg/dL — ABNORMAL HIGH (ref 0.44–1.00)
GFR, Estimated: 52 mL/min — ABNORMAL LOW (ref >60)
Glucose, Bld: 116 mg/dL — ABNORMAL HIGH (ref 70–99)
Potassium: 3.5 mmol/L (ref 3.5–5.1)
Sodium: 139 mmol/L (ref 135–145)
Total Bilirubin: 0.5 mg/dL (ref 0.3–1.2)
Total Protein: 6.2 g/dL — ABNORMAL LOW (ref 6.5–8.1)

## 2022-05-16 LAB — PHOSPHORUS: Phosphorus: 4.7 mg/dL — ABNORMAL HIGH (ref 2.5–4.6)

## 2022-05-16 LAB — CBC WITH DIFFERENTIAL/PLATELET
Abs Immature Granulocytes: 0.13 10*3/uL — ABNORMAL HIGH (ref 0.00–0.07)
Basophils Absolute: 0.1 10*3/uL (ref 0.0–0.1)
Basophils Relative: 0 %
Eosinophils Absolute: 0.6 10*3/uL — ABNORMAL HIGH (ref 0.0–0.5)
Eosinophils Relative: 3 %
HCT: 27.3 % — ABNORMAL LOW (ref 36.0–46.0)
Hemoglobin: 8.9 g/dL — ABNORMAL LOW (ref 12.0–15.0)
Immature Granulocytes: 1 %
Lymphocytes Relative: 10 %
Lymphs Abs: 1.8 10*3/uL (ref 0.7–4.0)
MCH: 28.2 pg (ref 26.0–34.0)
MCHC: 32.6 g/dL (ref 30.0–36.0)
MCV: 86.4 fL (ref 80.0–100.0)
Monocytes Absolute: 1.3 10*3/uL — ABNORMAL HIGH (ref 0.1–1.0)
Monocytes Relative: 7 %
Neutro Abs: 14.7 10*3/uL — ABNORMAL HIGH (ref 1.7–7.7)
Neutrophils Relative %: 79 %
Platelets: 242 10*3/uL (ref 150–400)
RBC: 3.16 MIL/uL — ABNORMAL LOW (ref 3.87–5.11)
RDW: 17.4 % — ABNORMAL HIGH (ref 11.5–15.5)
WBC: 18.5 10*3/uL — ABNORMAL HIGH (ref 4.0–10.5)
nRBC: 0 % (ref 0.0–0.2)

## 2022-05-16 LAB — MAGNESIUM: Magnesium: 1.8 mg/dL (ref 1.7–2.4)

## 2022-05-16 MED ORDER — METHYLPHENIDATE HCL 5 MG PO TABS
5.0000 mg | ORAL_TABLET | Freq: Two times a day (BID) | ORAL | Status: DC
Start: 2022-05-17 — End: 2022-05-21
  Administered 2022-05-17 – 2022-05-21 (×9): 5 mg via ORAL
  Filled 2022-05-16 (×9): qty 1

## 2022-05-16 MED ORDER — POTASSIUM CHLORIDE CRYS ER 20 MEQ PO TBCR
40.0000 meq | EXTENDED_RELEASE_TABLET | Freq: Once | ORAL | Status: AC
  Administered 2022-05-16: 40 meq via ORAL
  Filled 2022-05-16: qty 2

## 2022-05-16 NOTE — Progress Notes (Signed)
Patient ID: Tara [name], female   DOB: 11-18-1945, 76 y.o.   MRN: [id number] Alert, conversant Stood up today with assist Improving slowly Appreciate PT and their efforts.

## 2022-05-16 NOTE — Progress Notes (Signed)
Physical Therapy Treatment Patient Details Name: Tara [name] MRN: [id number] DOB: 1946/06/17 Today's Date: 05/16/2022   History of Present Illness 76 yo woman admitted 03/15/22 with new onset seizures related to meningioma. s/p tumor resection 7/3. Recent DC from [facility] 6/22. MRI on 7/26 shows: SDH extending inferiorly along the clivus and  narrowing the foramen magnum with possible mass effect. PMH: CKD stage 3, HFpEF, a fib, meningioma.    PT Comments    Patient seen for follow up splint check for ankle contracture boot and noted area of pressure under calf and  at achilles.  She did not seem to be in pain, but was asleep.  Will attempt to address with brace modification.  Family aware of plan.   Recommendations for follow up therapy are one component of a multi-disciplinary discharge planning process, led by the attending physician.  Recommendations may be updated based on patient status, additional functional criteria and insurance authorization.  Follow Up Recommendations  Skilled nursing-short term rehab (<3 hours/day) Can patient physically be transported by private vehicle: No   Assistance Recommended at Discharge Frequent or constant Supervision/Assistance  Patient can return home with the following Two people to help with walking and/or transfers;Two people to help with bathing/dressing/bathroom;Assist for transportation;Assistance with feeding;Assistance with Education officer, environmental (measurements PT);Wheelchair cushion (measurements PT);Hospital bed;Other (comment);BSC/3in1    Recommendations for Other Services       Precautions / Restrictions Precautions Precautions: Fall Precaution Comments: seizures Required Braces or Orthoses: Other Brace Other Brace: bilat resting hand splints, R hand palmar guard, ankle contracture boot (wearing schedules to be posted in room)     Mobility  Bed Mobility                   Transfers              Ambulation/Gait                   Stairs             Wheelchair Mobility    Modified Rankin (Stroke Patients Only) Modified Rankin (Stroke Patients Only) Pre-Morbid Rankin Score: Moderately severe disability Modified Rankin: Severe disability     Balance Overall balance assessment: Needs assistance Sitting-balance support: Feet supported, Bilateral upper extremity supported Sitting balance-Leahy Scale: Poor Sitting balance - Comments: min to mod A for balance in sitting Postural control: Posterior lean Standing balance support: Single extremity supported Standing balance-Leahy Scale: Zero Standing balance comment: supported in [place] lift for standing today up to 20-30 seconds                            Cognition Arousal/Alertness:  (asleep) Behavior During Therapy: Flat affect Overall Cognitive Status: Impaired/Different from baseline Area of Impairment: Attention, Following commands, Problem solving                   Current Attention Level: Focused   Following Commands: Follows one step commands with increased time, Follows one step commands inconsistently     Problem Solving: Slow processing, Decreased initiation, Difficulty sequencing, Requires verbal cues, Requires tactile cues General Comments: responded to yes/no questions though mostly flat during session.  Able to state sister's name and when given choice recognized neice's name; needed incresed time to attempt extremity movements        Exercises Other Exercises Other Exercises: Initiated ankle boot for positioning to prevent ankle contracture and education with  family regarding wear time/cycle.  Placed and adjusted fit and to return to check skin    General Comments General comments (skin integrity, edema, etc.): mobility/balance deferred, splint check only, noted areas under R calf and ankle contracture boot since AM session (3 hours) and removed to adjust for  proper fit.  sisters in the room and educated on plan to attempt wearing again once brace adjusted.      Pertinent Vitals/Pain Pain Assessment Pain Assessment: No/denies pain Faces Pain Scale: No hurt Pain Location: general grimacing with movement Pain Descriptors / Indicators: Grimacing Pain Intervention(s): Limited activity within patient's tolerance    Home Living                          Prior Function            PT Goals (current goals can now be found in the care plan section) Progress towards PT goals: Progressing toward goals    Frequency    Min 3X/week      PT Plan Current plan remains appropriate    Co-evaluation PT/OT/SLP Co-Evaluation/Treatment: Yes Reason for Co-Treatment: Complexity of the patient's impairments (multi-system involvement);Necessary to address cognition/behavior during functional activity;For patient/therapist safety;To address functional/ADL transfers PT goals addressed during session: Mobility/safety with mobility;Balance;Strengthening/ROM        AM-PAC PT "6 Clicks" Mobility   Outcome Measure  Help needed turning from your back to your side while in a flat bed without using bedrails?: Total Help needed moving from lying on your back to sitting on the side of a flat bed without using bedrails?: Total Help needed moving to and from a bed to a chair (including a wheelchair)?: Total Help needed standing up from a chair using your arms (e.g., wheelchair or bedside chair)?: Total Help needed to walk in hospital room?: Total Help needed climbing 3-5 steps with a railing? : Total 6 Click Score: 6    End of Session Equipment Utilized During Treatment: Oxygen Activity Tolerance: Patient tolerated treatment well Patient left: in bed;with family/visitor present Nurse Communication: Mobility status;Need for lift equipment PT Visit Diagnosis: Other abnormalities of gait and mobility (R26.89);Other symptoms and signs involving the  nervous system (R29.898)     Time: 1536-1550 PT Time Calculation (min) (ACUTE ONLY): 14 min  Charges:  $Orthotics/Prosthetics Check: 8-22 mins                     Magda Kiel, PT Acute Rehabilitation Services Office:[phone] 05/16/2022    [name] 05/16/2022, 5:37 PM

## 2022-05-16 NOTE — Progress Notes (Addendum)
PROGRESS NOTE    Tara [name]  XTA:[id number] DOB: 04-Jun-1946 DOA: 03/15/2022 PCP: [name], MD  Chief Complaint  Patient presents with   Seizures    Brief Narrative:  76 year old African-American female PMH of anxiety, depression, type II DM, HLD, PAF, on Eliquis, CKD 3B, breast cancer SP chemoradiation presented to hospital on 6/24 with new onset seizures. Known history of sphenoidal meningioma that was noted to be increased in size on the CT scan with surrounding edema so she is transferred from [facility] for further evaluation.  She underwent LTM with no further seizures and was evaluated by neurosurgery and underwent bifrontal craniotomy for meningioma resection on 03/24/2020 and was transferred to the intensive care unit postoperatively with PCCM consulting while she was in ICU. 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  Treated with Unasyn for 5 days. 7/11 recurrent sz like activity. Not correlated on EEG. CT unchanged.  7/13 neurology signed off. 7/19 started on ABX for sepsis then transition to Unasyn. 7/21 palliative care was consulted. 7/23 CT chest and abdomen was performed negative for PE or significant pneumonia, without any hemorrhage 7/25 MRI brain without contrast shows increasing mass effect, SDH as well as subacute/acute small scattered infarct. 7/26 tube feeds resumed, neurology reconsulted.  Eliquis resumed per neurosurgery 7/27 oxygenation improving, mentation improving, neurology following.  CTA head and neck without any LVO.  Lower extremity Doppler without any DVT.  Echo shows new systolic dysfunction with global hypokinesis EF 40 to 45% and significant RV failure. 7/30 had another aspiration event.  Tube feeds on hold. Now on NRB. Remains full code. No tracheostomy per husband. Per Neurosurgery, give at least two weeks if not more to see if she improves.  8/1 oxygenation improving again to 5 L.  Trickle tube feeds started. 8/9 on D1 honey thick liquid diet.  Calorie count initiated. 8/13 oxygenation improving to 2 L. 8/14 core track removed nocturnal tube feeds stopped. 8/15 leukocytosis worsening, abdomen distended, x-ray abdomen shows evidence of possible sigmoid volvulus, GI consulted, CT abdomen negative for any volvulus and only shows redundant sigmoid colon.  Will resume diet. 8/16-8/22: leukocytosis of unknown etiology.    Assessment & Plan:   Principal Problem:   Seizure ([place]) Active Problems:   Meningioma, cerebral (HCC)   Leukocytosis   Lethargy   Acute on chronic combined systolic and diastolic CHF (congestive heart failure) (HCC)   Acute respiratory failure with hypoxia (HCC)   Hypertension   Anxiety and depression   GERD   Hyperlipidemia   Hypokalemia   AKI (acute kidney injury) ([place])   Atrial fibrillation, chronic (HCC)   Protein-calorie malnutrition, moderate (HCC)   Meningioma (HCC)   Pressure injury of skin   Aspiration into airway   Palliative care encounter   Dysfunction of right cardiac ventricle   Acute postoperative anemia due to expected blood loss   Dysphagia   Assessment and Plan: * Seizure ([place]) Continue Keppra EEG 8/24 with moderate diffuse encephalopathy, no seizures or epileptiform discharges PM&R (8/24 note) recommending considering decreasing keppra? I discussed with neurology on call who were ok decreasing to 750 mg BID (8/24).  Will continue to monitor.  Meningioma, cerebral [facility]) Per neurosurgery  Lethargy PM&R recommending considering ritalin 5 mg twice daily  I  discussed informally with cards, in absence of RVR and decompensated HF can consider this - will discuss with husband and daughter rachel prior to  starting  Leukocytosis Recurrent. Know specific infectious source identified although patient has been recently treated for recurrent aspiration pneumonia. Recently completed course of unasyn 8/3 CXR 8/19 concerning for edema vs pneumia Will repeat CXR without acute abnormality, low lung volumes  Acute respiratory failure with hypoxia (HCC) Secondary to pneumonia with associated tachypnea. Hypoxia improved but not resolved. Repeat chest x-ray with edema vs pneumonia. Patient with leukocytosis and no other infectious symptoms. Down to 2 L/min with Lasix. -Wean as able -repeat CXR with low lung volumes, no acute abnormality  Acute on chronic combined systolic and diastolic CHF (congestive heart failure) (HCC) Most recent Transthoracic Echocardiogram with LVEF of 40-45% (down from 55%) with global hypokinesis and associated RV dysfunction noted. Patient with acute heart failure this admission that responded to Lasix. Currently resolved. Not on GDMT secondary to recent hypotension. -Continue Lasix  Hypertension - Continue lasix, currently on midodrine  Anxiety and depression Previously on Zoloft and Klonopin which are discontinued.  GERD -Continue Protonix  Atrial fibrillation, chronic (HCC) Patient is on rate/rhythm control and on anticoagulation. -Continue amiodarone and Eliquis  AKI (acute kidney injury) ([place]) Possibly secondary to lasix although now has stabilized. Weight appears to have downtrended from admission but is stable for the past week.  Hypokalemia Replace and follow  Hyperlipidemia -Continue Crestor  Dysphagia Previously with [place] Cortrak and tube feedings. Evaluated/followed by speech therapy. -Speech therapy recommendations (8/15): Diet recommendations: Dysphagia 2 (fine chop);Honey-thick liquid Liquids provided via: Cup;Straw Medication Administration: Crushed with puree Supervision: Full supervision/cueing for compensatory strategies;Staff to assist with self  feeding Compensations: Slow rate;Small sips/bites;Multiple dry swallows after each bite/sip;Clear throat intermittently Postural Changes and/or Swallow Maneuvers: Seated upright 90 degrees  Acute postoperative anemia due to expected blood loss Acute anemia down to 6.5 requiring 2 units of PRBC. Hemoglobin currently stable.  Dysfunction of right cardiac ventricle Acute/chronic PE ruled out. Possibly related to pneumonia. Recommendation for repeat Transthoracic Echocardiogram in 6 months. Patient can follow-up with pulmonology.  Hypernatremia-resolved as of 05/07/2022 Resolved.  Thrombocytopenia (HCC)-resolved as of 05/07/2022 Resolved.      DVT prophylaxis: eliquis Code Status: full Family Communication: family at bedside - daughter rachel over phone, husband over phone Disposition:   Status is: Inpatient Remains inpatient appropriate because: per primary   Consultants:  Neurosurgery Neurology Pccm Palliative care GI  Procedures:  See significant events above  Antimicrobials:  Anti-infectives (From admission, onward)    Start     Dose/Rate Route Frequency Ordered Stop   04/21/22 0000  Ampicillin-Sulbactam (UNASYN) 3 g in sodium chloride 0.9 % 100 mL IVPB        3 g 200 mL/hr over 30 Minutes Intravenous Every 8 hours 04/20/22 1913 04/25/22 0018   04/12/22 1500  Ampicillin-Sulbactam (UNASYN) 3 g in sodium chloride 0.9 % 100 mL IVPB  Status:  Discontinued        3 g 200 mL/hr over 30 Minutes Intravenous Every 8 hours 04/12/22 1409 04/20/22 1913   04/11/22 1845  Ampicillin-Sulbactam (UNASYN) 3 g in sodium chloride 0.9 % 100 mL IVPB  Status:  Discontinued        3 g 200 mL/hr over 30 Minutes Intravenous Every 8 hours 04/11/22 1754 04/12/22 1409   04/10/22 1200  vancomycin (VANCOREADY) IVPB 750 mg/150 mL  Status:  Discontinued        750 mg 150 mL/hr  over 60 Minutes Intravenous Every 24 hours 04/09/22 1034 04/11/22 1100   04/10/22 0900  metroNIDAZOLE (FLAGYL) IVPB 500 mg   Status:  Discontinued        500 mg 100 mL/hr over 60 Minutes Intravenous Every 12 hours 04/10/22 0754 04/11/22 1754   04/09/22 2300  ceFEPIme (MAXIPIME) 2 g in sodium chloride 0.9 % 100 mL IVPB  Status:  Discontinued        2 g 200 mL/hr over 30 Minutes Intravenous Every 12 hours 04/09/22 1035 04/11/22 1717   04/09/22 1045  vancomycin (VANCOREADY) IVPB 1750 mg/350 mL        1,750 mg 175 mL/hr over 120 Minutes Intravenous  Once 04/09/22 0953 04/09/22 1344   04/09/22 1045  ceFEPIme (MAXIPIME) 2 g in sodium chloride 0.9 % 100 mL IVPB        2 g 200 mL/hr over 30 Minutes Intravenous  Once 04/09/22 0953 04/09/22 1524   03/27/22 0945  Ampicillin-Sulbactam (UNASYN) 3 g in sodium chloride 0.9 % 100 mL IVPB        3 g 200 mL/hr over 30 Minutes Intravenous Every 6 hours 03/27/22 0859 04/01/22 0518   03/25/22 0600  ceFAZolin (ANCEF) IVPB 2g/100 mL premix        2 g 200 mL/hr over 30 Minutes Intravenous On call to O.R. 03/24/22 1215 03/24/22 1440       Subjective: Denies pain Family at bedside  Objective: Vitals:   05/16/22 0751 05/16/22 0822 05/16/22 1144 05/16/22 1443  BP: 108/65  125/72 116/70  Pulse: 88  93 98  Resp: (!) '24  16 20  '$ Temp: 97.7 F (36.5 C)  98.3 F (36.8 C) 98.2 F (36.8 C)  TempSrc: Oral  Oral Oral  SpO2: 90% 90% 90% 94%  Weight:      Height:        Intake/Output Summary (Last 24 hours) at 05/16/2022 1712 Last data filed at 05/16/2022 0950 Gross per 24 hour  Intake 300 ml  Output 600 ml  Net -300 ml   Filed Weights   05/03/22 0345 05/04/22 0413 05/06/22 0500  Weight: 73.6 kg 74.7 kg 74.7 kg    Examination:  General: No acute distress. Cardiovascular: RRR Lungs: unlabored Abdomen: Soft, nontender, nondistended  Neurological: eyes open today, lip smacking, echolalia (less today), moving all extremities Extremities: trace edema bilaterally   Data Reviewed: I have personally reviewed following labs and imaging studies  CBC: Recent Labs  Lab  05/10/22 0117 05/11/22 0336 05/12/22 0620 05/13/22 1022 05/14/22 1048 05/15/22 0555 05/16/22 0243  WBC 20.7*   < > 19.4* 18.5* 18.7* 17.7* 18.5*  NEUTROABS 15.4*  --   --   --  14.3* 14.0* 14.7*  HGB 9.1*   < > 9.6* 9.8* 9.6* 9.2* 8.9*  HCT 28.0*   < > 30.4* 30.6* 29.7* 28.6* 27.3*  MCV 85.1   < > 86.6 86.0 87.4 85.1 86.4  PLT 270   < > 256 278 260 257 242   < > = values in this interval not displayed.    Basic Metabolic Panel: Recent Labs  Lab 05/10/22 0117 05/12/22 0620 05/13/22 1022 05/14/22 1048 05/15/22 0555 05/16/22 0243  NA 140 140  --  141 140 139  K 3.7 3.2* 4.1 4.0 3.8 3.5  CL 106 111  --  109 107 107  CO2 23 23  --  '25 24 22  '$ GLUCOSE 124* 112*  --  111* 112* 116*  BUN 28* 23  --  22 22 27*  CREATININE 1.16* 0.98  --  1.10* 1.03* 1.10*  CALCIUM 8.9 8.7*  --  8.7* 8.8* 8.7*  MG  --   --   --   --  1.8 1.8  PHOS  --   --   --   --  4.6 4.7*    GFR: Estimated Creatinine Clearance: 36.4 mL/min (Abree Romick) (by C-G formula based on SCr of 1.1 mg/dL (H)).  Liver Function Tests: Recent Labs  Lab 05/14/22 1048 05-23-22 0555 05/16/22 0243  AST '28 27 23  '$ ALT 33 34 25  ALKPHOS 89 92 96  BILITOT 0.3 0.4 0.5  PROT 6.3* 6.6 6.2*  ALBUMIN 2.6* 2.7* 2.6*    CBG: No results for input(s): "GLUCAP" in the last 168 hours.   No results found for this or any previous visit (from the past 240 hour(s)).       Radiology Studies: EEG adult  Result Date: 05-23-22 Lora Havens, MD     05/23/2022  2:26 PM Patient Name: Tara [name] MRN: [id number] Epilepsy Attending: Lora Havens Referring Physician/Provider: Elodia Florence., MD Date: 2022/05/23 Duration: 22.12 mins Patient history:  76 y.o. female with past medical history of anxiety, depression, GERD, hypertension, hyperlipidemia, nonischemic cardiomyopathy, atrial fibrillation on eliquis, Systolic HF, hx of breast cancer, meningioma who presented to the hospital on 03/15/2022 for evaluation of new onset  seizures. Level of alertness: Awake AEDs during EEG study: LEV, LCM Technical aspects: This EEG study was done with scalp electrodes positioned according to the 10-20 International system of electrode placement. Electrical activity was reviewed with band pass filter of 1-'70Hz'$ , sensitivity of 7 uV/mm, display speed of 50m/sec with Marciano Mundt '60Hz'$  notched filter applied as appropriate. EEG data were recorded continuously and digitally stored.  Video monitoring was available and reviewed as appropriate. Description: No clear posterior dominant rhythm was seen.  EEG showed continuous 3 to 5 Hz theta-delta slowing. Hyperventilation and photic stimulation were not performed.   ABNORMALITY - Continuous slow, generalized  IMPRESSION: This study is suggestive moderate diffuse encephalopathy, nonspecific etiology.  No seizures or epileptiform discharges were seen throughout the recording.  PLora Havens  DG CHEST PORT 1 VIEW  Result Date: 05/14/2022 CLINICAL DATA:  Leukocytosis. EXAM: PORTABLE CHEST 1 VIEW COMPARISON:  05/10/2022, CT 04/13/2022. FINDINGS: Low lung volumes. Stable heart size and mediastinal contours. Calcified right-sided adenopathy consistent with prior granulomatous disease. Improved left perihilar opacities from prior exam. No acute consolidation. No pleural effusion or pneumothorax. Stable osseous structures. Surgical clips in the right axilla. IMPRESSION: Low lung volumes. No acute abnormality. Improved left perihilar opacities from prior exam. Electronically Signed   By: MKeith RakeM.D.   On: 05/14/2022 18:06        Scheduled Meds:  amiodarone  200 mg Oral Daily   apixaban  5 mg Oral BID   budesonide (PULMICORT) nebulizer solution  0.25 mg Nebulization BID   furosemide  40 mg Oral QODAY   lacosamide  200 mg Oral BID   levETIRAcetam  750 mg Oral BID   midodrine  5 mg Oral BID WC   montelukast  10 mg Oral QHS   multivitamin with minerals  1 tablet Oral Daily   mouth rinse  15 mL Mouth  Rinse 4 times per day   pantoprazole  40 mg Oral BID   rosuvastatin  20 mg Oral Daily   senna-docusate  1 tablet Oral BID   Continuous Infusions:   LOS: 62  days    Time spent: over 30 min    [name], MD [facility]   To contact the attending provider between 7A-7P or the covering provider during after hours 7P-7A, please log into the web site [email or web address] and access using universal [place] password for that web site. If you do not have the password, please call the hospital operator.  05/16/2022, 5:12 PM

## 2022-05-16 NOTE — Progress Notes (Signed)
Speech Language Pathology Treatment: Dysphagia  Patient Details Name: Tara [name] MRN: [id number] DOB: [birth year] Today's Date: 05/16/2022 Time: 1138-1202 SLP Time Calculation (min) (ACUTE ONLY): 24 min  Assessment / Plan / Recommendation Clinical Impression  Re- assessment of swallowing requested (new BSE order acknowledged) this date, per family request to determine if pt is ready for diet advancement. Sisters x2 and niece present for session reported pt doing well with dys 2/HTL diet during mealtimes. Sister inquired regarding clinician's use of self-thickened liquid, stating MD suspecting "clumpy" thickened liquids have resulted in GI issues. Discussed with RN who has not heard report of this. After thorough oral care, assisted pt with ice chips, thin liquids by cup/straw, HTL and bites of simulated dys 2 solids. No overt s/sx of aspiration exhibited with any consistency, but suspect some delay in swallow initiation and this is likely given recent MBS report (8/8). Oral prep remains prolonged, but she demonstrates functional ability to clear oral cavity of POs with time. SLP recommendation this date is consistent with recommendation on 8/24. Continue dys 2 diet/HTL with repeat MBS likely next week. Discussed with family and RN. Will f/u.    HPI HPI: Pt is a 76 y/o who presented 6/24 with new onset seizures. 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 EEG negative for seizures. Neurosurgey consulted and did not recommend meningioma resection as of 6/27. Decline in swallow function noted overnight 6/27. Dx acute toxic metabolic encephalopathy suspected secondary to keppra. Pt s/p Bifrontal craniotomy for resection of meningioma 7/3.  Cortrak 7/7. 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  CXR 8/6 concerning for patchy airspace disease. PMH: HTN, HLD, GERD, CKD stage IIIb, chronic HFpEF, meningioma, breast CA-s/p right lumpectomy and chemoradiation in 2008,  depression/anxiety. BSE 03/11/22: regular texture diet and thin liquids without need for follow up.      SLP Plan  Continue with current plan of care      Recommendations for follow up therapy are one component of a multi-disciplinary discharge planning process, led by the attending physician.  Recommendations may be updated based on patient status, additional functional criteria and insurance authorization.    Recommendations  Diet recommendations: Dysphagia 2 (fine chop);Honey-thick liquid Liquids provided via: Cup Medication Administration: Crushed with puree Supervision: Full supervision/cueing for compensatory strategies Compensations: Slow rate;Small sips/bites Postural Changes and/or Swallow Maneuvers: Seated upright 90 degrees                Oral Care Recommendations: Oral care BID Follow Up Recommendations: Skilled nursing-short term rehab (<3 hours/day) Assistance recommended at discharge: Frequent or constant Supervision/Assistance SLP Visit Diagnosis: Dysphagia, oropharyngeal phase (R13.12) Plan: Continue with current plan of care            [place] Dense, [place], [place] Office Number: [place]  05/16/2022, 12:13 PM

## 2022-05-16 NOTE — Progress Notes (Signed)
Physical Therapy Treatment Patient Details Name: Tara [name] MRN: [id number] DOB: [birth year] Today's Date: 05/16/2022   History of Present Illness 76 yo woman admitted 03/15/22 with new onset seizures related to meningioma. s/p tumor resection 7/3. Recent DC from [facility] 6/22. MRI on 7/26 shows: SDH extending inferiorly along the clivus and  narrowing the foramen magnum with possible mass effect. PMH: CKD stage 3, HFpEF, a fib, meningioma.    PT Comments    Patient seen with OT for focus on mobility up on her feet with Clarise Cruz Plus assist.  Patient tolerating up to about 30 seconds, but with posterior bias and poor tolerance noted increased lip smacking and increased respirations applying O2 to address.  She has fear of falling, but also wonder about pain in feet due to limited ankle ROM.  Initiated ankle contracture boot and caregiver education, but plan to return to check fit.  Patient more easily responding verbally to yes/no questions and recalling sister's names, though does demonstrate some echolalia.  She will benefit from continued skilled PT in the acute setting and will need STSNF for decreased burden of care and caregiver education.    Recommendations for follow up therapy are one component of a multi-disciplinary discharge planning process, led by the attending physician.  Recommendations may be updated based on patient status, additional functional criteria and insurance authorization.  Follow Up Recommendations  Skilled nursing-short term rehab (<3 hours/day) Can patient physically be transported by private vehicle: No   Assistance Recommended at Discharge Frequent or constant Supervision/Assistance  Patient can return home with the following Two people to help with walking and/or transfers;Two people to help with bathing/dressing/bathroom;Assist for transportation;Assistance with feeding;Assistance with Education officer, environmental (measurements  PT);Wheelchair cushion (measurements PT);Hospital bed;Other (comment);BSC/3in1    Recommendations for Other Services       Precautions / Restrictions Precautions Precautions: Fall Precaution Comments: seizures Restrictions Weight Bearing Restrictions: No     Mobility  Bed Mobility               General bed mobility comments: up in chair    Transfers Overall transfer level: Needs assistance Equipment used: Ambulation equipment used Transfers: Sit to/from Stand Sit to Stand: Max assist, +2 physical assistance, +2 safety/equipment           General transfer comment: stood from recliner using [place] for support and +2 A for support under hips with chair pad, standing after assist for relaxing tone in arms and reaching to hold handles on lift.  Patient stood x 2 each time about 15-20 seconds Transfer via Lift Equipment: Agricultural engineer Rankin (Stroke Patients Only) Modified Rankin (Stroke Patients Only) Pre-Morbid Rankin Score: Moderately severe disability Modified Rankin: Severe disability     Balance Overall balance assessment: Needs assistance Sitting-balance support: Feet supported, Bilateral upper extremity supported Sitting balance-Leahy Scale: Poor Sitting balance - Comments: min to mod A for balance in sitting Postural control: Posterior lean Standing balance support: Single extremity supported Standing balance-Leahy Scale: Zero Standing balance comment: supported in [place] lift for standing today up to 20-30 seconds                            Cognition Arousal/Alertness: Awake/alert  Behavior During Therapy: Flat affect Overall Cognitive Status: Impaired/Different from baseline Area of Impairment: Attention, Following commands, Problem solving                   Current Attention Level: Focused   Following Commands: Follows one step  commands with increased time, Follows one step commands inconsistently     Problem Solving: Slow processing, Decreased initiation, Difficulty sequencing, Requires verbal cues, Requires tactile cues General Comments: responded to yes/no questions though mostly flat during session.  Able to state sister's name and when given choice recognized neice's name; needed incresed time to attempt extremity movements        Exercises Other Exercises Other Exercises: Initiated ankle boot for positioning to prevent ankle contracture and education with family regarding wear time/cycle.  Placed and adjusted fit and to return to check skin    General Comments General comments (skin integrity, edema, etc.): Applied 2L [place] O2 during session though weaning on RA at rest, SpO2 87% after session with pt sleeping so applied O2 and RN aware      Pertinent Vitals/Pain Pain Assessment Pain Assessment: No/denies pain Faces Pain Scale: Hurts a little bit Pain Location: general grimacing with movement Pain Descriptors / Indicators: Grimacing Pain Intervention(s): Limited activity within patient's tolerance    Home Living                          Prior Function            PT Goals (current goals can now be found in the care plan section) Progress towards PT goals: Progressing toward goals    Frequency    Min 3X/week      PT Plan Current plan remains appropriate    Co-evaluation PT/OT/SLP Co-Evaluation/Treatment: Yes Reason for Co-Treatment: Complexity of the patient's impairments (multi-system involvement);Necessary to address cognition/behavior during functional activity;For patient/therapist safety;To address functional/ADL transfers PT goals addressed during session: Mobility/safety with mobility;Balance;Strengthening/ROM OT goals addressed during session: ADL's and self-care;Strengthening/ROM      AM-PAC PT "6 Clicks" Mobility   Outcome Measure  Help needed turning from your  back to your side while in a flat bed without using bedrails?: Total Help needed moving from lying on your back to sitting on the side of a flat bed without using bedrails?: Total Help needed moving to and from a bed to a chair (including a wheelchair)?: Total Help needed standing up from a chair using your arms (e.g., wheelchair or bedside chair)?: Total Help needed to walk in hospital room?: Total Help needed climbing 3-5 steps with a railing? : Total 6 Click Score: 6    End of Session Equipment Utilized During Treatment: Oxygen Activity Tolerance: Patient tolerated treatment well Patient left: in chair;with call bell/phone within reach;with family/visitor present Nurse Communication: Mobility status;Need for lift equipment PT Visit Diagnosis: Other abnormalities of gait and mobility (R26.89);Other symptoms and signs involving the nervous system (R29.898)     Time: 5732-2025 PT Time Calculation (min) (ACUTE ONLY): 41 min  Charges:  $Therapeutic Activity: 23-37 mins                     Magda Kiel, PT Acute Rehabilitation Services [phone] 05/16/2022    [name] 05/16/2022, 4:05 PM

## 2022-05-16 NOTE — TOC Progression Note (Signed)
Transition of Care [facility]) - Progression Note    Patient Details  Name: Tara [name] MRN: [id number] Date of Birth: [birth year]  [facility]) CM/SW [place], [place] Phone Number: 05/16/2022, 3:11 PM  Clinical Narrative:     Received insurance Josem Kaufmann 8/24-8/28 for Tara [name] Informed SNF anticipated discharge possible on Monday.  TOC will continue to follow and assist with discharge planning.  Thurmond Butts, MSW, LCSW Clinical Social Worker    Expected Discharge Plan: Skilled Nursing Facility Barriers to Discharge: Continued Medical Work up, Ship broker  Expected Discharge Plan and Services Expected Discharge Plan: [place]   Discharge Planning Services: CM Consult   Living arrangements for the past 2 months: [place]: [place]         Social Determinants of Health (SDOH) Interventions    Readmission Risk Interventions    03/06/2022   12:36 PM  Readmission Risk Prevention Plan  Transportation Screening Complete  HRI or Home Care Consult Complete  Social Work Consult for [place] Planning/Counseling Complete

## 2022-05-16 NOTE — Progress Notes (Signed)
Occupational Therapy Treatment Note  Pt seen for splint check for B resting hand splints. Pt asleep. Splints removed after wearing @ 3 hours without any areas of pressure noted. Pt has R palm guard to wear at this time as indicated previously. Will have OT follow up tomorrow and establish wearing schedule.     05/16/22 1603  OT Visit Information  Last OT Received On 05/16/22  Assistance Needed +2  History of Present Illness 76 yo woman admitted 03/15/22 with new onset seizures related to meningioma. s/p tumor resection 7/3. Recent DC from [facility] 6/22. MRI on 7/26 shows: SDH extending inferiorly along the clivus and  narrowing the foramen magnum with possible mass effect. PMH: CKD stage 3, HFpEF, a fib, meningioma.  Precautions  Precautions Fall  Precaution Comments seizures  Pain Assessment  Pain Assessment Faces  Pain Score 0  General Comments  General comments (skin integrity, edema, etc.) seen for splint check for B hand splints after wearing 3 hours  OT - End of Session  Activity Tolerance Patient tolerated treatment well  Patient left in bed  Nurse Communication Other (comment) (positioning)  OT Assessment/Plan  OT Plan Discharge plan remains appropriate;Frequency remains appropriate  OT Visit Diagnosis Unsteadiness on feet (R26.81);Other abnormalities of gait and mobility (R26.89);Muscle weakness (generalized) (M62.81);Low vision, both eyes (H54.2);Other symptoms and signs involving cognitive function;Pain  OT Frequency (ACUTE ONLY) Min 2X/week  Follow Up Recommendations Skilled nursing-short term rehab (<3 hours/day)  Assistance recommended at discharge Frequent or constant Supervision/Assistance  Patient can return home with the following Two people to help with walking and/or transfers;Two people to help with bathing/dressing/bathroom;Assistance with cooking/housework;Assistance with feeding;Direct supervision/assist for medications management;Direct supervision/assist for  financial management;Assist for transportation;Help with stairs or ramp for entrance  OT Equipment Other (comment)  AM-PAC OT "6 Clicks" Daily Activity Outcome Measure (Version 2)  Help from another person eating meals? 1  Help from another person taking care of personal grooming? 1  Help from another person toileting, which includes using toliet, bedpan, or urinal? 1  Help from another person bathing (including washing, rinsing, drying)? 1  Help from another person to put on and taking off regular upper body clothing? 1  Help from another person to put on and taking off regular lower body clothing? 1  6 Click Score 6  Progressive Mobility  What is the highest level of mobility based on the progressive mobility assessment? Level 1 (Bedfast) - Unable to balance while sitting on edge of bed  Activity Stood at bedside  OT Goal Progression  Progress towards OT goals Progressing toward goals  Acute Rehab OT Goals  Patient Stated Goal pe family for Anh to get better  OT Goal Formulation With family  Time For Goal Achievement 05/30/22  Potential to Achieve Goals Fair  ADL Goals  Pt Will Perform Grooming with mod assist;sitting  Pt Will Perform Upper Body Bathing with mod assist;sitting  Pt Will Perform Lower Body Bathing with max assist;sit to/from stand  Pt Will Perform Lower Body Dressing with modified independence;sitting/lateral leans;sit to/from stand  Pt Will Transfer to Toilet with max assist;with +2 assist;bedside commode  Pt Will Perform Toileting - Clothing Manipulation and hygiene with modified independence;sitting/lateral leans;sit to/from stand  Additional ADL Goal #2 Pt will accurately rspond to questions when given choice of 2 answers  Additional ADL Goal #3 Ptwill maintain midlien posutral control EOB with min A in preparation for ADL tasks  Additional ADL Goal #4 pt will toelrate B resting hand splints  at nights to prevent further flexor tightness B hands  OT Time  Calculation  OT Start Time (ACUTE ONLY) 1535  OT Stop Time (ACUTE ONLY) 1549  OT Time Calculation (min) 14 min  OT General Charges  $OT Visit 1 Visit  OT Treatments  $Orthotics/Prosthetics Check 8-22 mins   Maurie Boettcher, OT/L   Acute OT Clinical Specialist [place] Pager [phone] Office [phone]

## 2022-05-16 NOTE — Progress Notes (Signed)
Occupational Therapy Treatment Patient Details Name: Tara [name] MRN: [id number] DOB: 11/12/1945 Today's Date: 05/16/2022   History of present illness 76 yo woman admitted 03/15/22 with new onset seizures related to meningioma. s/p tumor resection 7/3. Recent DC from [facility] 6/22. MRI on 7/26 shows: SDH extending inferiorly along the clivus and  narrowing the foramen magnum with possible mass effect. PMH: CKD stage 3, HFpEF, a fib, meningioma.   OT comments  Pt seen as cotreat with PT with family (sisters and niece) present in room. OOB in chair on arrival. Significant improvement in arousal, attention, spontaneous speech and ability to follow commands. Pt answering yes/no questions and although she was "parroting" at times, she was also demonstrating appropriate spontaneous speech. Improved speech, response during meaningful conversation. [name] plus used to stand pt @ 20-30 seconds several times. On last stand, pt spontaneously said "I need to sit down". Holding BUE in flexor pattern with increased tightness noted RUE. B resting hand splints ordered by Dr Swartz. Splints donned, will return to check for tolerance. Recommend pt wear splints during night time hours once tolerance is established, and encourage functional use of hands during waking hours. Discussed with pt's sister "Tara [name]". Pt continues to be appropriate for rehab at SNF at this time.    Recommendations for follow up therapy are one component of a multi-disciplinary discharge planning process, led by the attending physician.  Recommendations may be updated based on patient status, additional functional criteria and insurance authorization.    Follow Up Recommendations  Skilled nursing-short term rehab (<3 hours/day)    Assistance Recommended at Discharge Frequent or constant Supervision/Assistance  Patient can return home with the following  Two people to help with walking and/or transfers;Two people to help with  bathing/dressing/bathroom;Assistance with cooking/housework;Assistance with feeding;Direct supervision/assist for medications management;Direct supervision/assist for financial management;Assist for transportation;Help with stairs or ramp for entrance   Equipment Recommendations  Other (comment) (TBD)    Recommendations for Other Services      Precautions / Restrictions Precautions Precautions: Fall Precaution Comments: seizures Restrictions Weight Bearing Restrictions: No       Mobility Bed Mobility               General bed mobility comments: up in chair    Transfers Overall transfer level: Needs assistance Equipment used: Ambulation equipment used Transfers: Sit to/from Stand             General transfer comment: stood from recliner using [name] Plus for support and +2 A for support under hips with chair pad, standing after assist for relaxing tone in arms and reaching to hold handles on lift.  Patient stood x 2 each time about 15-20 seconds Transfer via Lift Equipment: [name] Lift   Balance Overall balance assessment: Needs assistance   Sitting balance-Leahy Scale: Poor Sitting balance - Comments: min to mod A for balance in sitting Postural control: Posterior lean   Standing balance-Leahy Scale: Zero Standing balance comment: supported in [name] lift for standing today up to 20-30 seconds (posterior bias)                           ADL either performed or assessed with clinical judgement   ADL Overall ADL's : Needs assistance/impaired Eating/Feeding: NPO Eating/Feeding Details (indicate cue type and reason): speech recommending WMS Grooming: Maximal assistance   Upper Body Bathing: Total assistance   Lower Body Bathing: Total assistance   Upper Body Dressing : Total assistance     Lower Body Dressing: Total assistance   Toilet Transfer: +2 for physical assistance;Total assistance Pt positioned into seated position at edge of chair. Attempted  to have pt rub lotion on legs, however she kept B fists clenched.                   Extremity/Trunk Assessment Upper Extremity Assessment Upper Extremity Assessment: RUE deficits/detail;LUE deficits/detail RUE Deficits / Details: increased flexor tone; tone releases with pressure to biceps and shoulder protraction; di not attmept to use R hand, however did hold onto [place] hand hold during transfer RUE Coordination: decreased fine motor;decreased gross motor LUE Deficits / Details: LUE tends to more freely but prefers to stay in flexor synergy pattern; did not attmept to sue funcitonally, however held onto hand hold of [name] Plu LUE Coordination: decreased fine motor;decreased gross motor   Lower Extremity Assessment Lower Extremity Assessment: Defer to PT evaluation        Vision   Vision Assessment?: Vision impaired- to be further tested in functional context Additional Comments: able to visually attend at times, however does not sustain visual attention   Perception Perception Perception: Impaired   Praxis Praxis Praxis: Impaired Praxis Impairment Details:  (apparent difficulty with initiation and motor planning)    Cognition Arousal/Alertness: Awake/alert Behavior During Therapy: Flat affect Overall Cognitive Status: Impaired/Different from baseline Area of Impairment: Attention, Following commands, Problem solving                   Current Attention Level: Focused   Following Commands: Follows one step commands with increased time, Follows one step commands inconsistently Safety/Judgement: Decreased awareness of safety, Decreased awareness of deficits Awareness: Intellectual Problem Solving: Slow processing, Decreased initiation, Difficulty sequencing, Requires verbal cues, Requires tactile cues General Comments: responding to yes/no questions mostly flat during session, did arouse more as session progressed able to keep eyes open and follow commands; echolaia  however also resonding yes/no adn automatic speech resonses at times        Exercises General Exercises - Upper Extremity Shoulder Flexion: Both, 5 reps, Seated, PROM Shoulder Extension: PROM, 5 reps, Both, Seated Shoulder ABduction: PROM, Both, Seated, 5 reps Shoulder ADduction: PROM, 5 reps, Both, Seated Elbow Flexion: PROM, Both, 5 reps, Seated Elbow Extension: PROM, Both, 5 reps, Seated Wrist Flexion: PROM, Both, 5 reps, Seated Wrist Extension: PROM, Both, 5 reps, Seated Digit Composite Flexion: PROM, Both, 5 reps, Seated Composite Extension: PROM, Both, 5 reps, Seated Other Exercises Other Exercises: inhibition of tone followed by ROM Other Exercises: forward trunk flexion in sitting for passive stretch with improved sitting balance after    Shoulder Instructions       General Comments      Pertinent Vitals/ Pain       Pain Assessment Pain Assessment: Faces Faces Pain Scale: Hurts a little bit Pain Location: general grimacing with movement Pain Descriptors / Indicators: Grimacing Pain Intervention(s): Limited activity within patient's tolerance  Home Living                                          Prior Functioning/Environment              Frequency  Min 2X/week        Progress Toward Goals  OT Goals(current goals can now be found in the care plan section)  Progress towards OT goals: Progressing toward goals  Acute  Rehab OT Goals Patient Stated Goal: per family for Tria to get better OT Goal Formulation: Patient unable to participate in goal setting ADL Goals Pt Will Perform Grooming: with mod assist;sitting Pt Will Perform Upper Body Bathing: with mod assist;sitting Pt Will Perform Lower Body Bathing: with max assist;sit to/from stand Pt Will Transfer to Toilet: with max assist;with +2 assist;bedside commode Additional ADL Goal #1:  (met 8/25) Additional ADL Goal #2: Pt will accurately rspond to questions when given choice of  2 answers Additional ADL Goal #3: Ptwill maintain midlien posutral control EOB with min A in preparation for ADL tasks Additional ADL Goal #4: pt will toelrate B resting hand splints at nights to prevent further flexor tightness B hands  Plan Discharge plan remains appropriate;Frequency remains appropriate    Co-evaluation    PT/OT/SLP Co-Evaluation/Treatment: Yes Reason for Co-Treatment: Complexity of the patient's impairments (multi-system involvement);Necessary to address cognition/behavior during functional activity;For patient/therapist safety;To address functional/ADL transfers PT goals addressed during session: Mobility/safety with mobility;Balance;Strengthening/ROM OT goals addressed during session: ADL's and self-care;Strengthening/ROM      AM-PAC OT "6 Clicks" Daily Activity     Outcome Measure   Help from another person eating meals?: Total Help from another person taking care of personal grooming?: Total Help from another person toileting, which includes using toliet, bedpan, or urinal?: Total Help from another person bathing (including washing, rinsing, drying)?: Total Help from another person to put on and taking off regular upper body clothing?: Total Help from another person to put on and taking off regular lower body clothing?: Total 6 Click Score: 6    End of Session Equipment Utilized During Treatment: Gait belt;Oxygen (2L added due to desat when sleeping)  OT Visit Diagnosis: Unsteadiness on feet (R26.81);Other abnormalities of gait and mobility (R26.89);Muscle weakness (generalized) (M62.81);Low vision, both eyes (H54.2);Other symptoms and signs involving cognitive function;Pain Pain - part of body:  (generalized)   Activity Tolerance Patient tolerated treatment well   Patient Left in chair;with call bell/phone within reach;with family/visitor present   Nurse Communication Mobility status;Need for lift equipment        Time: 1200-1241 OT Time Calculation  (min): 41 min  Charges: OT General Charges $OT Visit: 1 Visit OT Treatments $Self Care/Home Management : 8-22 mins  Maurie Boettcher, OT/L   Acute OT Clinical Specialist [place] Pager [phone] Office [phone]   [facility] 05/16/2022, 1:55 PM

## 2022-05-16 NOTE — Plan of Care (Signed)
Problem: Education: Goal: Ability to describe self-care measures that may prevent or decrease complications (Diabetes Survival Skills Education) will improve Outcome: Progressing Goal: Individualized Educational Video(s) Outcome: Progressing   Problem: Coping: Goal: Ability to adjust to condition or change in health will improve Outcome: Progressing   Problem: Fluid Volume: Goal: Ability to maintain a balanced intake and output will improve Outcome: Progressing   Problem: Health Behavior/Discharge Planning: Goal: Ability to identify and utilize available resources and services will improve Outcome: Progressing Goal: Ability to manage health-related needs will improve Outcome: Progressing   Problem: Metabolic: Goal: Ability to maintain appropriate glucose levels will improve Outcome: Progressing   Problem: Nutritional: Goal: Maintenance of adequate nutrition will improve Outcome: Progressing Goal: Progress toward achieving an optimal weight will improve Outcome: Progressing   Problem: Skin Integrity: Goal: Risk for impaired skin integrity will decrease Outcome: Progressing   Problem: Tissue Perfusion: Goal: Adequacy of tissue perfusion will improve Outcome: Progressing   Problem: Education: Goal: Knowledge of General Education information will improve Description: Including pain rating scale, medication(s)/side effects and non-pharmacologic comfort measures Outcome: Progressing   Problem: Health Behavior/Discharge Planning: Goal: Ability to manage health-related needs will improve Outcome: Progressing   Problem: Clinical Measurements: Goal: Ability to maintain clinical measurements within normal limits will improve Outcome: Progressing Goal: Will remain free from infection Outcome: Progressing Goal: Diagnostic test results will improve Outcome: Progressing Goal: Respiratory complications will improve Outcome: Progressing Goal: Cardiovascular complication will  be avoided Outcome: Progressing   Problem: Activity: Goal: Risk for activity intolerance will decrease Outcome: Progressing   Problem: Nutrition: Goal: Adequate nutrition will be maintained Outcome: Progressing   Problem: Coping: Goal: Level of anxiety will decrease Outcome: Progressing   Problem: Elimination: Goal: Will not experience complications related to bowel motility Outcome: Progressing Goal: Will not experience complications related to urinary retention Outcome: Progressing   Problem: Pain Managment: Goal: General experience of comfort will improve Outcome: Progressing   Problem: Safety: Goal: Ability to remain free from injury will improve Outcome: Progressing   Problem: Skin Integrity: Goal: Risk for impaired skin integrity will decrease Outcome: Progressing   Problem: Education: Goal: Expressions of having a comfortable level of knowledge regarding the disease process will increase Outcome: Progressing   Problem: Coping: Goal: Ability to adjust to condition or change in health will improve Outcome: Progressing Goal: Ability to identify appropriate support needs will improve Outcome: Progressing   Problem: Health Behavior/Discharge Planning: Goal: Compliance with prescribed medication regimen will improve Outcome: Progressing   Problem: Education: Goal: Knowledge of General Education information will improve Description: Including pain rating scale, medication(s)/side effects and non-pharmacologic comfort measures Outcome: Progressing   Problem: Health Behavior/Discharge Planning: Goal: Ability to manage health-related needs will improve Outcome: Progressing   Problem: Clinical Measurements: Goal: Ability to maintain clinical measurements within normal limits will improve Outcome: Progressing Goal: Will remain free from infection Outcome: Progressing Goal: Diagnostic test results will improve Outcome: Progressing Goal: Respiratory  complications will improve Outcome: Progressing Goal: Cardiovascular complication will be avoided Outcome: Progressing   Problem: Activity: Goal: Risk for activity intolerance will decrease Outcome: Progressing   Problem: Nutrition: Goal: Adequate nutrition will be maintained Outcome: Progressing   Problem: Coping: Goal: Level of anxiety will decrease Outcome: Progressing   Problem: Elimination: Goal: Will not experience complications related to bowel motility Outcome: Progressing Goal: Will not experience complications related to urinary retention Outcome: Progressing   Problem: Pain Managment: Goal: General experience of comfort will improve Outcome: Progressing   Problem: Safety: Goal: Ability  to remain free from injury will improve Outcome: Progressing   Problem: Skin Integrity: Goal: Risk for impaired skin integrity will decrease Outcome: Progressing

## 2022-05-17 DIAGNOSIS — R569 Unspecified convulsions: Secondary | ICD-10-CM | POA: Diagnosis not present

## 2022-05-17 LAB — CBC WITH DIFFERENTIAL/PLATELET
Abs Immature Granulocytes: 0.14 10*3/uL — ABNORMAL HIGH (ref 0.00–0.07)
Basophils Absolute: 0.1 10*3/uL (ref 0.0–0.1)
Basophils Relative: 0 %
Eosinophils Absolute: 0.5 10*3/uL (ref 0.0–0.5)
Eosinophils Relative: 3 %
HCT: 26.4 % — ABNORMAL LOW (ref 36.0–46.0)
Hemoglobin: 8.6 g/dL — ABNORMAL LOW (ref 12.0–15.0)
Immature Granulocytes: 1 %
Lymphocytes Relative: 11 %
Lymphs Abs: 1.9 10*3/uL (ref 0.7–4.0)
MCH: 27.7 pg (ref 26.0–34.0)
MCHC: 32.6 g/dL (ref 30.0–36.0)
MCV: 84.9 fL (ref 80.0–100.0)
Monocytes Absolute: 1.3 10*3/uL — ABNORMAL HIGH (ref 0.1–1.0)
Monocytes Relative: 8 %
Neutro Abs: 13.5 10*3/uL — ABNORMAL HIGH (ref 1.7–7.7)
Neutrophils Relative %: 77 %
Platelets: 222 10*3/uL (ref 150–400)
RBC: 3.11 MIL/uL — ABNORMAL LOW (ref 3.87–5.11)
RDW: 17.3 % — ABNORMAL HIGH (ref 11.5–15.5)
WBC: 17.3 10*3/uL — ABNORMAL HIGH (ref 4.0–10.5)
nRBC: 0 % (ref 0.0–0.2)

## 2022-05-17 LAB — COMPREHENSIVE METABOLIC PANEL
ALT: 27 U/L (ref 0–44)
AST: 21 U/L (ref 15–41)
Albumin: 2.7 g/dL — ABNORMAL LOW (ref 3.5–5.0)
Alkaline Phosphatase: 98 U/L (ref 38–126)
Anion gap: 10 (ref 5–15)
BUN: 26 mg/dL — ABNORMAL HIGH (ref 8–23)
CO2: 21 mmol/L — ABNORMAL LOW (ref 22–32)
Calcium: 8.6 mg/dL — ABNORMAL LOW (ref 8.9–10.3)
Chloride: 109 mmol/L (ref 98–111)
Creatinine, Ser: 0.97 mg/dL (ref 0.44–1.00)
GFR, Estimated: >60 mL/min (ref >60)
Glucose, Bld: 120 mg/dL — ABNORMAL HIGH (ref 70–99)
Potassium: 3.1 mmol/L — ABNORMAL LOW (ref 3.5–5.1)
Sodium: 140 mmol/L (ref 135–145)
Total Bilirubin: 0.4 mg/dL (ref 0.3–1.2)
Total Protein: 6.4 g/dL — ABNORMAL LOW (ref 6.5–8.1)

## 2022-05-17 LAB — PHOSPHORUS: Phosphorus: 4.1 mg/dL (ref 2.5–4.6)

## 2022-05-17 LAB — MAGNESIUM: Magnesium: 1.7 mg/dL (ref 1.7–2.4)

## 2022-05-17 MED ORDER — MAGNESIUM SULFATE 2 GM/50ML IV SOLN
2.0000 g | Freq: Once | INTRAVENOUS | Status: AC
  Administered 2022-05-17: 2 g via INTRAVENOUS
  Filled 2022-05-17: qty 50

## 2022-05-17 MED ORDER — MAGNESIUM SULFATE 2 GM/50ML IV SOLN
2.0000 g | Freq: Once | INTRAVENOUS | Status: DC
  Filled 2022-05-17: qty 50

## 2022-05-17 MED ORDER — POTASSIUM CHLORIDE CRYS ER 20 MEQ PO TBCR
40.0000 meq | EXTENDED_RELEASE_TABLET | ORAL | Status: AC
Start: 2022-05-17 — End: 2022-05-17
  Administered 2022-05-17 (×2): 40 meq via ORAL
  Filled 2022-05-17 (×2): qty 2

## 2022-05-17 NOTE — Progress Notes (Signed)
Occupational Therapy Treatment Patient Details Name: Tara [name] MRN: [id number] DOB: 1945/10/30 Today's Date: 05/17/2022   History of present illness 76 yo woman admitted 03/15/22 with new onset seizures related to meningioma. s/p tumor resection 7/3. Recent DC from [facility] 6/22. MRI on 7/26 shows: SDH extending inferiorly along the clivus and  narrowing the foramen magnum with possible mass effect. PMH: CKD stage 3, HFpEF, a fib, meningioma.   OT comments  Tara [name] continues to make steady progress. Per RN and pt's husband, Tara [name] tolerated bilateral resting hand splints last night with notable improved mobility (less flexor pattern) today. Splints checked again and schedule set, see orders. Husband verbalized understanding. Pt had incontinent BM upon arrival, total A for LB Adls at bed level, however pt able to sustain sidelying bilaterally by grasping bed rails. Total A to transfer to sitting EOB, and max A +2 for 2x sit<>stands with use of steady. OT to continue to follow. POC remains appropriate.    Recommendations for follow up therapy are one component of a multi-disciplinary discharge planning process, led by the attending physician.  Recommendations may be updated based on patient status, additional functional criteria and insurance authorization.    Follow Up Recommendations  Skilled nursing-short term rehab (<3 hours/day)    Assistance Recommended at Discharge Frequent or constant Supervision/Assistance  Patient can return home with the following  Two people to help with walking and/or transfers;Two people to help with bathing/dressing/bathroom;Assistance with cooking/housework;Assistance with feeding;Direct supervision/assist for medications management;Direct supervision/assist for financial management;Assist for transportation;Help with stairs or ramp for entrance   Equipment Recommendations  Other (comment)    Recommendations for Other Services Other (comment)    Precautions /  Restrictions Precautions Precautions: Fall Precaution Comments: seizures Required Braces or Orthoses: Other Brace Other Brace: bilat resting hand splints, R hand palm guard, ankle contracture boot (wearing schedules to be posted in room) Restrictions Weight Bearing Restrictions: No       Mobility Bed Mobility Overal bed mobility: Needs Assistance Bed Mobility: Rolling, Supine to Sit, Sit to Supine Rolling: Max assist Sidelying to sit: Total assist       General bed mobility comments: assist from NT    Transfers Overall transfer level: Needs assistance Equipment used: Ambulation equipment used Transfers: Sit to/from Stand Sit to Stand: Max assist, +2 physical assistance, +2 safety/equipment           General transfer comment: stood with steady frame 2x Transfer via Lift Equipment: Stedy   Balance Overall balance assessment: Needs assistance Sitting-balance support: Feet supported, Bilateral upper extremity supported Sitting balance-Leahy Scale: Poor   Postural control: Posterior lean Standing balance support: Single extremity supported Standing balance-Leahy Scale: Zero                             ADL either performed or assessed with clinical judgement   ADL Overall ADL's : Needs assistance/impaired                         Toilet Transfer: Maximal assistance;+2 for physical assistance;+2 for safety/equipment Toilet Transfer Details (indicate cue type and reason): incontinent BM in bed upon arrival. simulated transfer with steady from bed>chair Toileting- Clothing Manipulation and Hygiene: Bed level;Total assistance;+2 for physical assistance;+2 for safety/equipment Toileting - Clothing Manipulation Details (indicate cue type and reason): once in sidelying, pt able to assist by holding bed rail     Functional mobility during ADLs: Maximal assistance;+2  for physical assistance;+2 for safety/equipment;Total assistance General ADL Comments: LB  bathing at bed level with rolling, total A for sidelying to sit and max A +2 for sit<>Stand with steady    Extremity/Trunk Assessment Upper Extremity Assessment Upper Extremity Assessment: RUE deficits/detail;LUE deficits/detail RUE Deficits / Details: more extension noted in hand, used to hold bed rail in sidelying and to grasp steady bar RUE Coordination: decreased fine motor;decreased gross motor LUE Deficits / Details: moves more freely and funcitonally this session with multimodal cues for rolling and holding onto the steady bar LUE Coordination: decreased fine motor;decreased gross motor   Lower Extremity Assessment Lower Extremity Assessment: Defer to PT evaluation        Vision   Vision Assessment?: Vision impaired- to be further tested in functional context Eye Alignment: Impaired (comment) Ocular Range of Motion: Impaired-to be further tested in functional context Alignment/Gaze Preference: Gaze right Tracking/Visual Pursuits: Impaired - to be further tested in functional context Additional Comments: poor visual attention   Perception Perception Perception: Impaired   Praxis Praxis Praxis: Impaired    Cognition Arousal/Alertness: Awake/alert Behavior During Therapy: Flat affect Overall Cognitive Status: Impaired/Different from baseline Area of Impairment: Attention, Following commands, Problem solving                 Orientation Level: Disoriented to, Time, Situation Current Attention Level: Focused Memory: Decreased recall of precautions, Decreased short-term memory Following Commands: Follows one step commands inconsistently Safety/Judgement: Decreased awareness of safety, Decreased awareness of deficits Awareness: Intellectual Problem Solving: Slow processing, Decreased initiation, Difficulty sequencing, Requires verbal cues, Requires tactile cues General Comments: responds to yes/no questions with increased time. flat throughout session         Exercises      Shoulder Instructions       General Comments splint check and schedule set - per RN and pt's husband, pt tolerated BUE resting hand splints all night with imprived mobility today. Educated husband on splint wear schedule, posted sign in room and placed orders    Pertinent Vitals/ Pain       Pain Assessment Pain Assessment: Faces Faces Pain Scale: No hurt Pain Location: general grimacing with movement Pain Descriptors / Indicators: Grimacing Pain Intervention(s): Limited activity within patient's tolerance, Monitored during session  Home Living                                          Prior Functioning/Environment              Frequency  Min 2X/week        Progress Toward Goals  OT Goals(current goals can now be found in the care plan section)  Progress towards OT goals: Progressing toward goals  Acute Rehab OT Goals Patient Stated Goal: per family for kosisoch[place]wu to go to rehab OT Goal Formulation: With family Time For Goal Achievement: 05/30/22 Potential to Achieve Goals: Fair ADL Goals Pt Will Perform Grooming: with mod assist;sitting Pt Will Perform Upper Body Bathing: with mod assist;sitting Pt Will Perform Lower Body Bathing: with max assist;sit to/from stand Pt Will Perform Lower Body Dressing: with modified independence;sitting/lateral leans;sit to/from stand Pt Will Transfer to Toilet: with max assist;with +2 assist;bedside commode Pt Will Perform Toileting - Clothing Manipulation and hygiene: with modified independence;sitting/lateral leans;sit to/from stand Additional ADL Goal #1: Pt will track to midline to find family members with moderate directional cues and max assist  to turn head Additional ADL Goal #2: Pt will accurately rspond to questions when given choice of 2 answers Additional ADL Goal #3: Ptwill maintain midlien posutral control EOB with min A in preparation for ADL tasks Additional ADL Goal #4: pt will  toelrate B resting hand splints at nights to prevent further flexor tightness B hands  Plan Discharge plan remains appropriate;Frequency remains appropriate    Co-evaluation                 AM-PAC OT "6 Clicks" Daily Activity     Outcome Measure   Help from another person eating meals?: Total Help from another person taking care of personal grooming?: Total Help from another person toileting, which includes using toliet, bedpan, or urinal?: Total Help from another person bathing (including washing, rinsing, drying)?: Total Help from another person to put on and taking off regular upper body clothing?: Total Help from another person to put on and taking off regular lower body clothing?: Total 6 Click Score: 6    End of Session Equipment Utilized During Treatment: Gait belt;Oxygen  OT Visit Diagnosis: Unsteadiness on feet (R26.81);Other abnormalities of gait and mobility (R26.89);Muscle weakness (generalized) (M62.81);Low vision, both eyes (H54.2);Other symptoms and signs involving cognitive function;Pain   Activity Tolerance Patient tolerated treatment well   Patient Left in chair;with call bell/phone within reach;with chair alarm set;with family/visitor present   Nurse Communication Other (comment)        Time: 9201-0071 OT Time Calculation (min): 37 min  Charges: OT General Charges $OT Visit: 1 Visit OT Treatments $Therapeutic Activity: 23-37 mins    Maekayla Giorgio A Alexandre Faries 05/17/2022, 4:00 PM

## 2022-05-17 NOTE — Progress Notes (Signed)
Subjective: Patient reports stable. NAE ON. Patient's husband at bedside.   Objective: Vital signs in last 24 hours: Temp:  [98 F (36.7 C)-99.2 F (37.3 C)] 98.4 F (36.9 C) (08/26 0712) Pulse Rate:  [84-98] 92 (08/26 0712) Resp:  [16-23] 20 (08/26 0712) BP: (89-125)/(53-72) 116/62 (08/26 0712) SpO2:  [90 %-97 %] 97 % (08/26 0712)  Intake/Output from previous day: 08/25 0701 - 08/26 0700 In: 300 [P.O.:300] Out: 400 [Urine:400] Intake/Output this shift: No intake/output data recorded.  Physical Exam: Patient is awake and alert. Eyes open spontaneously. Speech present but not fluent. They are in NAD and VSS. MAEW. PERLA, EOMI. CNs grossly intact. Incision is well approximated with no drainage, erythema, or edema.    Lab Results: Recent Labs    05/16/22 0243 05/17/22 0227  WBC 18.5* 17.3*  HGB 8.9* 8.6*  HCT 27.3* 26.4*  PLT 242 222   BMET Recent Labs    05/16/22 0243 05/17/22 0227  NA 139 140  K 3.5 3.1*  CL 107 109  CO2 22 21*  GLUCOSE 116* 120*  BUN 27* 26*  CREATININE 1.10* 0.97  CALCIUM 8.7* 8.6*    Studies/Results: EEG adult  Result Date: 05/15/2022 Lora Havens, MD     05/15/2022  2:26 PM Patient Name: AZA DANTES MRN: [id number] Epilepsy Attending: Lora Havens Referring Physician/Provider: Elodia Florence., MD Date: 05/15/2022 Duration: 22.12 mins Patient history:  76 y.o. female with past medical history of anxiety, depression, GERD, hypertension, hyperlipidemia, nonischemic cardiomyopathy, atrial fibrillation on eliquis, Systolic HF, hx of breast cancer, meningioma who presented to the hospital on 03/15/2022 for evaluation of new onset seizures. Level of alertness: Awake AEDs during EEG study: LEV, LCM Technical aspects: This EEG study was done with scalp electrodes positioned according to the 10-20 International system of electrode placement. Electrical activity was reviewed with band pass filter of 1-'70Hz'$ , sensitivity of 7 uV/mm, display  speed of 55m/sec with a '60Hz'$  notched filter applied as appropriate. EEG data were recorded continuously and digitally stored.  Video monitoring was available and reviewed as appropriate. Description: No clear posterior dominant rhythm was seen.  EEG showed continuous 3 to 5 Hz theta-delta slowing. Hyperventilation and photic stimulation were not performed.   ABNORMALITY - Continuous slow, generalized  IMPRESSION: This study is suggestive moderate diffuse encephalopathy, nonspecific etiology.  No seizures or epileptiform discharges were seen throughout the recording.  Priyanka OBarbra Sarks   Assessment/Plan: Patient is doing well. Neuro exam is stable. Making slow progress with therapy. Awaiting SNF placement, possibly Monday. Continue PT/OT.    LOS: [place]     JMarvis Moeller DNP, AGNP-C Neurosurgery Nurse Practitioner  [facility] [address], [place] [address]P: [phone]   F: [phone] 05/17/2022 10:57 AM

## 2022-05-17 NOTE — Progress Notes (Signed)
PROGRESS NOTE    Tara [name]  ATF:[id number] DOB: 05/13/1946 DOA: 03/15/2022 PCP: [name], MD  Chief Complaint  Patient presents with   Seizures    Brief Narrative:  76 year old African-American female PMH of anxiety, depression, type II DM, HLD, PAF, on Eliquis, CKD 3B, breast cancer SP chemoradiation presented to hospital on 6/24 with new onset seizures. Known history of sphenoidal meningioma that was noted to be increased in size on the CT scan with surrounding edema so she is transferred from [facility] for further evaluation.  She underwent LTM with no further seizures and was evaluated by neurosurgery and underwent bifrontal craniotomy for meningioma resection on 03/24/2020 and was transferred to the intensive care unit postoperatively with PCCM consulting while she was in ICU. 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  Treated with Unasyn for 5 days. 7/11 recurrent sz like activity. Not correlated on EEG. CT unchanged.  7/13 neurology signed off. 7/19 started on ABX for sepsis then transition to Unasyn. 7/21 palliative care was consulted. 7/23 CT chest and abdomen was performed negative for PE or significant pneumonia, without any hemorrhage 7/25 MRI brain without contrast shows increasing mass effect, SDH as well as subacute/acute small scattered infarct. 7/26 tube feeds resumed, neurology reconsulted.  Eliquis resumed per neurosurgery 7/27 oxygenation improving, mentation improving, neurology following.  CTA head and neck without any LVO.  Lower extremity Doppler without any DVT.  Echo shows new systolic dysfunction with global hypokinesis EF 40 to 45% and significant RV failure. 7/30 had another aspiration event.  Tube feeds on hold. Now on NRB. Remains full code. No tracheostomy per husband. Per Neurosurgery, give at least two weeks if not more to see if she improves.  8/1 oxygenation improving again to 5 L.  Trickle tube feeds started. 8/9 on D1 honey thick liquid diet.  Calorie count initiated. 8/13 oxygenation improving to 2 L. 8/14 core track removed nocturnal tube feeds stopped. 8/15 leukocytosis worsening, abdomen distended, x-ray abdomen shows evidence of possible sigmoid volvulus, GI consulted, CT abdomen negative for any volvulus and only shows redundant sigmoid colon.  Will resume diet. 8/16-8/22: leukocytosis of unknown etiology.    Assessment & Plan:   Principal Problem:   Seizure ([place]) Active Problems:   Meningioma, cerebral (HCC)   Leukocytosis   Lethargy   Acute on chronic combined systolic and diastolic CHF (congestive heart failure) (HCC)   Acute respiratory failure with hypoxia (HCC)   Hypertension   Anxiety and depression   GERD   Hyperlipidemia   Hypokalemia   AKI (acute kidney injury) ([place])   Atrial fibrillation, chronic (HCC)   Protein-calorie malnutrition, moderate (HCC)   Meningioma (HCC)   Pressure injury of skin   Aspiration into airway   Palliative care encounter   Dysfunction of right cardiac ventricle   Acute postoperative anemia due to expected blood loss   Dysphagia   Assessment and Plan: * Seizure ([place]) Continue Keppra EEG 8/24 with moderate diffuse encephalopathy, no seizures or epileptiform discharges PM&R (8/24 note) recommending considering decreasing keppra? I discussed with neurology on call who were ok decreasing to 750 mg BID (8/24).  Will continue to monitor.  Meningioma, cerebral [facility]) Per neurosurgery  Lethargy PM&R recommending considering ritalin 5 mg twice daily  I  discussed informally with cards, in absence of RVR and decompensated HF can consider this - started 8/26, will follow response  Leukocytosis Recurrent. Know specific  infectious source identified although patient has been recently treated for recurrent aspiration pneumonia. Recently completed course of unasyn 8/3 CXR 8/19 concerning for edema vs pneumia Will repeat CXR without acute abnormality, low lung volumes  Acute respiratory failure with hypoxia (HCC) Secondary to pneumonia with associated tachypnea. Hypoxia improved but not resolved. Repeat chest x-ray with edema vs pneumonia. Patient with leukocytosis and no other infectious symptoms. Down to 2 L/min with Lasix. -Wean as able -repeat CXR with low lung volumes, no acute abnormality  Acute on chronic combined systolic and diastolic CHF (congestive heart failure) (HCC) Most recent Transthoracic Echocardiogram with LVEF of 40-45% (down from 55%) with global hypokinesis and associated RV dysfunction noted. Patient with acute heart failure this admission that responded to Lasix. Currently resolved. Not on GDMT secondary to recent hypotension. -Continue Lasix  Hypertension - Continue lasix, currently on midodrine  Anxiety and depression Previously on Zoloft and Klonopin which are discontinued.  GERD -Continue Protonix  Atrial fibrillation, chronic (HCC) Patient is on rate/rhythm control and on anticoagulation. -Continue amiodarone and Eliquis  AKI (acute kidney injury) ([place]) Possibly secondary to lasix although now has stabilized. Weight appears to have downtrended from admission but is stable for the past week.  Hypokalemia Replace and follow  Hyperlipidemia -Continue Crestor  Dysphagia Previously with [employer] Cortrak and tube feedings. Evaluated/followed by speech therapy. -Speech therapy recommendations (8/15): Diet recommendations: Dysphagia 2 (fine chop);Honey-thick liquid Liquids provided via: Cup;Straw Medication Administration: Crushed with puree Supervision: Full supervision/cueing for compensatory strategies;Staff to assist with self feeding Compensations: Slow rate;Small  sips/bites;Multiple dry swallows after each bite/sip;Clear throat intermittently Postural Changes and/or Swallow Maneuvers: Seated upright 90 degrees  Acute postoperative anemia due to expected blood loss Acute anemia down to 6.5 requiring 2 units of PRBC. Hemoglobin currently stable.  Dysfunction of right cardiac ventricle Acute/chronic PE ruled out. Possibly related to pneumonia. Recommendation for repeat Transthoracic Echocardiogram in 6 months. Patient can follow-up with pulmonology.  Hypernatremia-resolved as of 05/07/2022 Resolved.  Thrombocytopenia (HCC)-resolved as of 05/07/2022 Resolved.      DVT prophylaxis: eliquis Code Status: full Family Communication: husband Disposition:   Status is: Inpatient Remains inpatient appropriate because: per primary   Consultants:  Neurosurgery Neurology Pccm Palliative care GI  Procedures:  See significant events above  Antimicrobials:  Anti-infectives (From admission, onward)    Start     Dose/Rate Route Frequency Ordered Stop   04/21/22 0000  Ampicillin-Sulbactam (UNASYN) 3 g in sodium chloride 0.9 % 100 mL IVPB        3 g 200 mL/hr over 30 Minutes Intravenous Every 8 hours 04/20/22 1913 04/25/22 0018   04/12/22 1500  Ampicillin-Sulbactam (UNASYN) 3 g in sodium chloride 0.9 % 100 mL IVPB  Status:  Discontinued        3 g 200 mL/hr over 30 Minutes Intravenous Every 8 hours 04/12/22 1409 04/20/22 1913   04/11/22 1845  Ampicillin-Sulbactam (UNASYN) 3 g in sodium chloride 0.9 % 100 mL IVPB  Status:  Discontinued        3 g 200 mL/hr over 30 Minutes Intravenous Every 8 hours 04/11/22 1754 04/12/22 1409   04/10/22 1200  vancomycin (VANCOREADY) IVPB 750 mg/150 mL  Status:  Discontinued        750 mg 150 mL/hr over 60 Minutes Intravenous Every 24 hours 04/09/22 1034 04/11/22 1100   04/10/22 0900  metroNIDAZOLE (FLAGYL) IVPB 500 mg  Status:  Discontinued        500 mg 100 mL/hr over 60 Minutes Intravenous Every 12 hours  04/10/22 0754 04/11/22 1754   04/09/22 2300  ceFEPIme (MAXIPIME) 2 g in sodium chloride 0.9 % 100 mL IVPB  Status:  Discontinued        2 g 200 mL/hr over 30 Minutes Intravenous Every 12 hours 04/09/22 1035 04/11/22 1717   04/09/22 1045  vancomycin (VANCOREADY) IVPB 1750 mg/350 mL        1,750 mg 175 mL/hr over 120 Minutes Intravenous  Once 04/09/22 0953 04/09/22 1344   04/09/22 1045  ceFEPIme (MAXIPIME) 2 g in sodium chloride 0.9 % 100 mL IVPB        2 g 200 mL/hr over 30 Minutes Intravenous  Once 04/09/22 0953 04/09/22 1524   03/27/22 0945  Ampicillin-Sulbactam (UNASYN) 3 g in sodium chloride 0.9 % 100 mL IVPB        3 g 200 mL/hr over 30 Minutes Intravenous Every 6 hours 03/27/22 0859 04/01/22 0518   03/25/22 0600  ceFAZolin (ANCEF) IVPB 2g/100 mL premix        2 g 200 mL/hr over 30 Minutes Intravenous On call to O.R. 03/24/22 1215 03/24/22 1440       Subjective: No complaints Husband at bedside  Objective: Vitals:   05/17/22 0323 05/17/22 0712 05/17/22 1046 05/17/22 1635  BP: (!) 89/57 116/62 112/60 (!) 122/92  Pulse: 95 92 86 86  Resp: '19 20 18 17  '$ Temp: 98 F (36.7 C) 98.4 F (36.9 C) 98 F (36.7 C) 98.9 F (37.2 C)  TempSrc: Oral Oral Oral Axillary  SpO2: 92% 97% 95% 97%  Weight:      Height:        Intake/Output Summary (Last 24 hours) at 05/17/2022 1801 Last data filed at 05/17/2022 0545 Gross per 24 hour  Intake --  Output 400 ml  Net -400 ml   Filed Weights   05/03/22 0345 05/04/22 0413 05/06/22 0500  Weight: 73.6 kg 74.7 kg 74.7 kg    Examination:  General: No acute distress. Cardiovascular: RRR Lungs: unlabored Abdomen: Soft, nontender, nondistended  Neurological: some echolalia (less), lip smacking, moving all extremities, bilateral foot drop Extremities: No clubbing or cyanosis. No edema.  Data Reviewed: I have personally reviewed following labs and imaging studies  CBC: Recent Labs  Lab 05/13/22 1022 05/14/22 1048 05/15/22 0555  05/16/22 0243 05/17/22 0227  WBC 18.5* 18.7* 17.7* 18.5* 17.3*  NEUTROABS  --  14.3* 14.0* 14.7* 13.5*  HGB 9.8* 9.6* 9.2* 8.9* 8.6*  HCT 30.6* 29.7* 28.6* 27.3* 26.4*  MCV 86.0 87.4 85.1 86.4 84.9  PLT 278 260 257 242 144    Basic Metabolic Panel: Recent Labs  Lab 05/12/22 0620 05/13/22 1022 05/14/22 1048 05/15/22 0555 05/16/22 0243 05/17/22 0227  NA 140  --  141 140 139 140  K 3.2* 4.1 4.0 3.8 3.5 3.1*  CL 111  --  109 107 107 109  CO2 23  --  '25 24 22 '$ 21*  GLUCOSE 112*  --  111* 112* 116* 120*  BUN 23  --  22 22 27* 26*  CREATININE 0.98  --  1.10* 1.03* 1.10* 0.97  CALCIUM 8.7*  --  8.7* 8.8* 8.7* 8.6*  MG  --   --   --  1.8 1.8 1.7  PHOS  --   --   --  4.6 4.7* 4.1    GFR: Estimated  Creatinine Clearance: 41.3 mL/min (by C-G formula based on SCr of 0.97 mg/dL).  Liver Function Tests: Recent Labs  Lab 05/14/22 1048 05/15/22 0555 05/16/22 0243 05/17/22 0227  AST '28 27 23 21  '$ ALT 33 34 25 27  ALKPHOS 89 92 96 98  BILITOT 0.3 0.4 0.5 0.4  PROT 6.3* 6.6 6.2* 6.4*  ALBUMIN 2.6* 2.7* 2.6* 2.7*    CBG: No results for input(s): "GLUCAP" in the last 168 hours.   No results found for this or any previous visit (from the past 240 hour(s)).       Radiology Studies: No results found.      Scheduled Meds:  amiodarone  200 mg Oral Daily   apixaban  5 mg Oral BID   budesonide (PULMICORT) nebulizer solution  0.25 mg Nebulization BID   furosemide  40 mg Oral QODAY   lacosamide  200 mg Oral BID   levETIRAcetam  750 mg Oral BID   methylphenidate  5 mg Oral BID WC   midodrine  5 mg Oral BID WC   montelukast  10 mg Oral QHS   multivitamin with minerals  1 tablet Oral Daily   mouth rinse  15 mL Mouth Rinse 4 times per day   pantoprazole  40 mg Oral BID   rosuvastatin  20 mg Oral Daily   senna-docusate  1 tablet Oral BID   Continuous Infusions:   LOS: 63 days    Time spent: over 30 min    [name], MD [facility]   To contact the  attending provider between 7A-7P or the covering provider during after hours 7P-7A, please log into the web site [email or web address] and access using universal [place] password for that web site. If you do not have the password, please call the hospital operator.  05/17/2022, 6:01 PM

## 2022-05-18 DIAGNOSIS — R569 Unspecified convulsions: Secondary | ICD-10-CM | POA: Diagnosis not present

## 2022-05-18 LAB — BASIC METABOLIC PANEL
Anion gap: 12 (ref 5–15)
BUN: 27 mg/dL — ABNORMAL HIGH (ref 8–23)
CO2: 20 mmol/L — ABNORMAL LOW (ref 22–32)
Calcium: 8.9 mg/dL (ref 8.9–10.3)
Chloride: 112 mmol/L — ABNORMAL HIGH (ref 98–111)
Creatinine, Ser: 0.97 mg/dL (ref 0.44–1.00)
GFR, Estimated: >60 mL/min (ref >60)
Glucose, Bld: 108 mg/dL — ABNORMAL HIGH (ref 70–99)
Potassium: 4 mmol/L (ref 3.5–5.1)
Sodium: 144 mmol/L (ref 135–145)

## 2022-05-18 LAB — CBC
HCT: 29.4 % — ABNORMAL LOW (ref 36.0–46.0)
Hemoglobin: 9.2 g/dL — ABNORMAL LOW (ref 12.0–15.0)
MCH: 27.5 pg (ref 26.0–34.0)
MCHC: 31.3 g/dL (ref 30.0–36.0)
MCV: 88 fL (ref 80.0–100.0)
Platelets: 252 10*3/uL (ref 150–400)
RBC: 3.34 MIL/uL — ABNORMAL LOW (ref 3.87–5.11)
RDW: 17.5 % — ABNORMAL HIGH (ref 11.5–15.5)
WBC: 16.5 10*3/uL — ABNORMAL HIGH (ref 4.0–10.5)
nRBC: 0 % (ref 0.0–0.2)

## 2022-05-18 MED ORDER — CIPROFLOXACIN HCL 0.3 % OP SOLN
2.0000 [drp] | OPHTHALMIC | Status: DC
Start: 2022-05-18 — End: 2022-05-30
  Administered 2022-05-18 – 2022-05-30 (×55): 2 [drp] via OPHTHALMIC
  Filled 2022-05-18 (×2): qty 2.5

## 2022-05-18 NOTE — Progress Notes (Signed)
Subjective: Patient reports doing well. No current complaints. Husband at bedside. NAE ON.   Objective: Vital signs in last 24 hours: Temp:  [98 F (36.7 C)-98.9 F (37.2 C)] 98 F (36.7 C) (08/26 2350) Pulse Rate:  [86-98] 98 (08/26 2350) Resp:  [17-22] 22 (08/26 2350) BP: (106-122)/(56-92) 107/56 (08/26 2350) SpO2:  [95 %-100 %] 95 % (08/26 2350)  Intake/Output from previous day: 08/26 0701 - 08/27 0700 In: -  Out: 175 [Urine:175] Intake/Output this shift: No intake/output data recorded.  Physical Exam: Patient is awake and alert. Eyes open spontaneously. Speech present but not fluent. Answers simple questions appropriately. Follows commands. They are in NAD and VSS. MAEW. PERLA, EOMI. CNs grossly intact. Incision is well approximated with no drainage, erythema, or edema.    Lab Results: Recent Labs    05/17/22 0227 05/18/22 0530  WBC 17.3* 16.5*  HGB 8.6* 9.2*  HCT 26.4* 29.4*  PLT 222 252   BMET Recent Labs    05/17/22 0227 05/18/22 0530  NA 140 144  K 3.1* 4.0  CL 109 112*  CO2 21* 20*  GLUCOSE 120* 108*  BUN 26* 27*  CREATININE 0.97 0.97  CALCIUM 8.6* 8.9    Studies/Results: No results found.  Assessment/Plan: Patient is continuing to do well. Neuro exam stable. Making slow progress. Awaiting SNF placement, possibly Monday. Continue supportive care, PT/OT.   LOS: 64 days     Marvis Moeller, DNP, AGNP-C Neurosurgery Nurse Practitioner  [facility] [place] [address], [place], [place] [address] P: [phone]    F: [phone]  05/18/2022 6:30 AM

## 2022-05-18 NOTE — Progress Notes (Signed)
PROGRESS NOTE    Tara [name]  GLO:[id number] DOB: Mar 22, 1946 DOA: 03/15/2022 PCP: [name], MD  Chief Complaint  Patient presents with   Seizures    Brief Narrative:  76 year old African-American female PMH of anxiety, depression, type II DM, HLD, PAF, on Eliquis, CKD 3B, breast cancer SP chemoradiation presented to hospital on 6/24 with new onset seizures. Known history of sphenoidal meningioma that was noted to be increased in size on the CT scan with surrounding edema so she is transferred from [facility] for further evaluation.  She underwent LTM with no further seizures and was evaluated by neurosurgery and underwent bifrontal craniotomy for meningioma resection on 03/24/2020 and was transferred to the intensive care unit postoperatively with PCCM consulting while she was in ICU. 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  Treated with Unasyn for 5 days. 7/11 recurrent sz like activity. Not correlated on EEG. CT unchanged.  7/13 neurology signed off. 7/19 started on ABX for sepsis then transition to Unasyn. 7/21 palliative care was consulted. 7/23 CT chest and abdomen was performed negative for PE or significant pneumonia, without any hemorrhage 7/25 MRI brain without contrast shows increasing mass effect, SDH as well as subacute/acute small scattered infarct. 7/26 tube feeds resumed, neurology reconsulted.  Eliquis resumed per neurosurgery 7/27 oxygenation improving, mentation improving, neurology following.  CTA head and neck without any LVO.  Lower extremity Doppler without any DVT.  Echo shows new systolic dysfunction with global hypokinesis EF 40 to 45% and significant RV failure. 7/30 had another aspiration event.  Tube feeds on hold. Now on NRB. Remains full code. No tracheostomy per husband. Per Neurosurgery, give at least two weeks if not more to see if she improves.  8/1 oxygenation improving again to 5 L.  Trickle tube feeds started. 8/9 on D1 honey thick liquid diet.  Calorie count initiated. 8/13 oxygenation improving to 2 L. 8/14 core track removed nocturnal tube feeds stopped. 8/15 leukocytosis worsening, abdomen distended, x-ray abdomen shows evidence of possible sigmoid volvulus, GI consulted, CT abdomen negative for any volvulus and only shows redundant sigmoid colon.  Will resume diet. 8/16-8/22: leukocytosis of unknown etiology.    Assessment & Plan:   Principal Problem:   Seizure ([place]) Active Problems:   Meningioma, cerebral (HCC)   Leukocytosis   Lethargy   Acute on chronic combined systolic and diastolic CHF (congestive heart failure) (HCC)   Acute respiratory failure with hypoxia (HCC)   Hypertension   Anxiety and depression   GERD   Hyperlipidemia   Hypokalemia   AKI (acute kidney injury) ([place])   Atrial fibrillation, chronic (HCC)   Protein-calorie malnutrition, moderate (HCC)   Meningioma (HCC)   Pressure injury of skin   Aspiration into airway   Palliative care encounter   Dysfunction of right cardiac ventricle   Acute postoperative anemia due to expected blood loss   Dysphagia   Assessment and Plan: * Seizure ([place]) Continue Keppra EEG 8/24 with moderate diffuse encephalopathy, no seizures or epileptiform discharges PM&R (8/24 note) recommending considering decreasing keppra? I discussed with neurology on call who were ok decreasing to 750 mg BID (8/24).  Will continue to monitor.  Meningioma, cerebral [facility]) Per neurosurgery  Lethargy PM&R recommending considering ritalin 5 mg twice daily  I  discussed informally with cards, in absence of RVR and decompensated HF can consider this - started 8/26, will follow response  Leukocytosis Recurrent. Know specific  infectious source identified although patient has been recently treated for recurrent aspiration pneumonia. Recently completed course of unasyn 8/3 CXR 8/19 concerning for edema vs pneumia Will repeat CXR without acute abnormality, low lung volumes  Acute respiratory failure with hypoxia (HCC) Secondary to pneumonia with associated tachypnea. Hypoxia improved but not resolved. Repeat chest x-ray with edema vs pneumonia. Patient with leukocytosis and no other infectious symptoms. Down to 2 L/min with Lasix. -Wean as able -repeat CXR with low lung volumes, no acute abnormality  Acute on chronic combined systolic and diastolic CHF (congestive heart failure) (HCC) Most recent Transthoracic Echocardiogram with LVEF of 40-45% (down from 55%) with global hypokinesis and associated RV dysfunction noted. Patient with acute heart failure this admission that responded to Lasix. Currently resolved. Not on GDMT secondary to recent hypotension. -Continue Lasix  Hypertension - Continue lasix, currently on midodrine  Anxiety and depression Previously on Zoloft and Klonopin which are discontinued.  GERD -Continue Protonix  Atrial fibrillation, chronic (HCC) Patient is on rate/rhythm control and on anticoagulation. -Continue amiodarone and Eliquis  AKI (acute kidney injury) ([place]) Possibly secondary to lasix although now has stabilized. Weight appears to have downtrended from admission but is stable for the past week.  Hypokalemia Replace and follow  Hyperlipidemia -Continue Crestor  Dysphagia Previously with Hardie Veltre Cortrak and tube feedings. Evaluated/followed by speech therapy. -Speech therapy recommendations (8/15): Diet recommendations: Dysphagia 2 (fine chop);Honey-thick liquid Liquids provided via: Cup;Straw Medication Administration: Crushed with puree Supervision: Full supervision/cueing for compensatory strategies;Staff to assist with self feeding Compensations: Slow rate;Small  sips/bites;Multiple dry swallows after each bite/sip;Clear throat intermittently Postural Changes and/or Swallow Maneuvers: Seated upright 90 degrees  Acute postoperative anemia due to expected blood loss Acute anemia down to 6.5 requiring 2 units of PRBC. Hemoglobin currently stable.  Dysfunction of right cardiac ventricle Acute/chronic PE ruled out. Possibly related to pneumonia. Recommendation for repeat Transthoracic Echocardiogram in 6 months. Patient can follow-up with pulmonology.  Hypernatremia-resolved as of 05/07/2022 Resolved.  Thrombocytopenia (HCC)-resolved as of 05/07/2022 Resolved.      DVT prophylaxis: eliquis Code Status: full Family Communication: husband Disposition:   Status is: Inpatient Remains inpatient appropriate because: per primary   Consultants:  Neurosurgery Neurology Pccm Palliative care GI  Procedures:  See significant events above  Antimicrobials:  Anti-infectives (From admission, onward)    Start     Dose/Rate Route Frequency Ordered Stop   04/21/22 0000  Ampicillin-Sulbactam (UNASYN) 3 g in sodium chloride 0.9 % 100 mL IVPB        3 g 200 mL/hr over 30 Minutes Intravenous Every 8 hours 04/20/22 1913 04/25/22 0018   04/12/22 1500  Ampicillin-Sulbactam (UNASYN) 3 g in sodium chloride 0.9 % 100 mL IVPB  Status:  Discontinued        3 g 200 mL/hr over 30 Minutes Intravenous Every 8 hours 04/12/22 1409 04/20/22 1913   04/11/22 1845  Ampicillin-Sulbactam (UNASYN) 3 g in sodium chloride 0.9 % 100 mL IVPB  Status:  Discontinued        3 g 200 mL/hr over 30 Minutes Intravenous Every 8 hours 04/11/22 1754 04/12/22 1409   04/10/22 1200  vancomycin (VANCOREADY) IVPB 750 mg/150 mL  Status:  Discontinued        750 mg 150 mL/hr over 60 Minutes Intravenous Every 24 hours 04/09/22 1034 04/11/22 1100   04/10/22 0900  metroNIDAZOLE (FLAGYL) IVPB 500 mg  Status:  Discontinued        500 mg 100 mL/hr over 60 Minutes Intravenous Every 12 hours  04/10/22 0754 04/11/22 1754   04/09/22 2300  ceFEPIme (MAXIPIME) 2 g in sodium chloride 0.9 % 100 mL IVPB  Status:  Discontinued        2 g 200 mL/hr over 30 Minutes Intravenous Every 12 hours 04/09/22 1035 04/11/22 1717   04/09/22 1045  vancomycin (VANCOREADY) IVPB 1750 mg/350 mL        1,750 mg 175 mL/hr over 120 Minutes Intravenous  Once 04/09/22 0953 04/09/22 1344   04/09/22 1045  ceFEPIme (MAXIPIME) 2 g in sodium chloride 0.9 % 100 mL IVPB        2 g 200 mL/hr over 30 Minutes Intravenous  Once 04/09/22 0953 04/09/22 1524   03/27/22 0945  Ampicillin-Sulbactam (UNASYN) 3 g in sodium chloride 0.9 % 100 mL IVPB        3 g 200 mL/hr over 30 Minutes Intravenous Every 6 hours 03/27/22 0859 04/01/22 0518   03/25/22 0600  ceFAZolin (ANCEF) IVPB 2g/100 mL premix        2 g 200 mL/hr over 30 Minutes Intravenous On call to O.R. 03/24/22 1215 03/24/22 1440       Subjective: No complaints Answers inconsitently   Objective: Vitals:   05/17/22 2350 05/18/22 0728 05/18/22 1046 05/18/22 1447  BP: (!) 107/56 (!) 127/47 (!) 109/57 (!) 143/69  Pulse: 98 96 89 93  Resp: (!) '22 19 17 '$ (!) 21  Temp: 98 F (36.7 C) 98.5 F (36.9 C) 98.2 F (36.8 C) 98.2 F (36.8 C)  TempSrc:  Axillary Oral Oral  SpO2: 95% 96% 98% 96%  Weight:      Height:        Intake/Output Summary (Last 24 hours) at 05/18/2022 1654 Last data filed at 05/17/2022 1700 Gross per 24 hour  Intake --  Output 175 ml  Net -175 ml   Filed Weights   05/03/22 0345 05/04/22 0413 05/06/22 0500  Weight: 73.6 kg 74.7 kg 74.7 kg    Examination:  General: No acute distress. Cardiovascular: RRr Lungs: unlabored. Abdomen: Soft, nontender, nondistended  Neurological: awake, confused, answers some questions, follows commands inconsistently - lip smacking, echolalia not present today  Extremities: No clubbing or cyanosis. No edema.  Data Reviewed: I have personally reviewed following labs and imaging studies  CBC: Recent Labs   Lab 05/14/22 1048 05/15/22 0555 05/16/22 0243 05/17/22 0227 05/18/22 0530  WBC 18.7* 17.7* 18.5* 17.3* 16.5*  NEUTROABS 14.3* 14.0* 14.7* 13.5*  --   HGB 9.6* 9.2* 8.9* 8.6* 9.2*  HCT 29.7* 28.6* 27.3* 26.4* 29.4*  MCV 87.4 85.1 86.4 84.9 88.0  PLT 260 257 242 222 623    Basic Metabolic Panel: Recent Labs  Lab 05/14/22 1048 05/15/22 0555 05/16/22 0243 05/17/22 0227 05/18/22 0530  NA 141 140 139 140 144  K 4.0 3.8 3.5 3.1* 4.0  CL 109 107 107 109 112*  CO2 '25 24 22 '$ 21* 20*  GLUCOSE 111* 112* 116* 120* 108*  BUN 22 22 27* 26* 27*  CREATININE 1.10* 1.03* 1.10* 0.97 0.97  CALCIUM 8.7* 8.8* 8.7* 8.6* 8.9  MG  --  1.8 1.8 1.7  --   PHOS  --  4.6 4.7* 4.1  --     GFR: Estimated Creatinine Clearance: 41.3 mL/min (by C-G formula based on SCr of 0.97 mg/dL).  Liver Function Tests: Recent Labs  Lab  05/14/22 1048 05/15/22 0555 05/16/22 0243 05/17/22 0227  AST '28 27 23 21  '$ ALT 33 34 25 27  ALKPHOS 89 92 96 98  BILITOT 0.3 0.4 0.5 0.4  PROT 6.3* 6.6 6.2* 6.4*  ALBUMIN 2.6* 2.7* 2.6* 2.7*    CBG: No results for input(s): "GLUCAP" in the last 168 hours.   No results found for this or any previous visit (from the past 240 hour(s)).       Radiology Studies: No results found.      Scheduled Meds:  amiodarone  200 mg Oral Daily   apixaban  5 mg Oral BID   budesonide (PULMICORT) nebulizer solution  0.25 mg Nebulization BID   ciprofloxacin  2 drop Right Eye Q4H while awake   furosemide  40 mg Oral QODAY   lacosamide  200 mg Oral BID   levETIRAcetam  750 mg Oral BID   methylphenidate  5 mg Oral BID WC   midodrine  5 mg Oral BID WC   montelukast  10 mg Oral QHS   multivitamin with minerals  1 tablet Oral Daily   mouth rinse  15 mL Mouth Rinse 4 times per day   pantoprazole  40 mg Oral BID   rosuvastatin  20 mg Oral Daily   senna-docusate  1 tablet Oral BID   Continuous Infusions:   LOS: 64 days    Time spent: over 30 min    [name],  MD [facility]   To contact the attending provider between 7A-7P or the covering provider during after hours 7P-7A, please log into the web site [email or web address] and access using universal [place] password for that web site. If you do not have the password, please call the hospital operator.  05/18/2022, 4:54 PM

## 2022-05-19 ENCOUNTER — Inpatient Hospital Stay (HOSPITAL_COMMUNITY): Payer: Medicare Other

## 2022-05-19 DIAGNOSIS — R569 Unspecified convulsions: Secondary | ICD-10-CM | POA: Diagnosis not present

## 2022-05-19 DIAGNOSIS — R5383 Other fatigue: Secondary | ICD-10-CM

## 2022-05-19 DIAGNOSIS — D329 Benign neoplasm of meninges, unspecified: Secondary | ICD-10-CM | POA: Diagnosis not present

## 2022-05-19 LAB — COMPREHENSIVE METABOLIC PANEL
ALT: 28 U/L (ref 0–44)
AST: 25 U/L (ref 15–41)
Albumin: 2.7 g/dL — ABNORMAL LOW (ref 3.5–5.0)
Alkaline Phosphatase: 93 U/L (ref 38–126)
Anion gap: 10 (ref 5–15)
BUN: 25 mg/dL — ABNORMAL HIGH (ref 8–23)
CO2: 23 mmol/L (ref 22–32)
Calcium: 8.7 mg/dL — ABNORMAL LOW (ref 8.9–10.3)
Chloride: 110 mmol/L (ref 98–111)
Creatinine, Ser: 1.15 mg/dL — ABNORMAL HIGH (ref 0.44–1.00)
GFR, Estimated: 49 mL/min — ABNORMAL LOW (ref >60)
Glucose, Bld: 105 mg/dL — ABNORMAL HIGH (ref 70–99)
Potassium: 3.7 mmol/L (ref 3.5–5.1)
Sodium: 143 mmol/L (ref 135–145)
Total Bilirubin: 0.5 mg/dL (ref 0.3–1.2)
Total Protein: 6.5 g/dL (ref 6.5–8.1)

## 2022-05-19 LAB — CBC WITH DIFFERENTIAL/PLATELET
Abs Immature Granulocytes: 0.1 10*3/uL — ABNORMAL HIGH (ref 0.00–0.07)
Basophils Absolute: 0.1 10*3/uL (ref 0.0–0.1)
Basophils Relative: 0 %
Eosinophils Absolute: 0.5 10*3/uL (ref 0.0–0.5)
Eosinophils Relative: 3 %
HCT: 27.3 % — ABNORMAL LOW (ref 36.0–46.0)
Hemoglobin: 8.9 g/dL — ABNORMAL LOW (ref 12.0–15.0)
Immature Granulocytes: 1 %
Lymphocytes Relative: 11 %
Lymphs Abs: 1.9 10*3/uL (ref 0.7–4.0)
MCH: 27.9 pg (ref 26.0–34.0)
MCHC: 32.6 g/dL (ref 30.0–36.0)
MCV: 85.6 fL (ref 80.0–100.0)
Monocytes Absolute: 1.3 10*3/uL — ABNORMAL HIGH (ref 0.1–1.0)
Monocytes Relative: 8 %
Neutro Abs: 12.8 10*3/uL — ABNORMAL HIGH (ref 1.7–7.7)
Neutrophils Relative %: 77 %
Platelets: 261 10*3/uL (ref 150–400)
RBC: 3.19 MIL/uL — ABNORMAL LOW (ref 3.87–5.11)
RDW: 17.2 % — ABNORMAL HIGH (ref 11.5–15.5)
WBC: 16.5 10*3/uL — ABNORMAL HIGH (ref 4.0–10.5)
nRBC: 0 % (ref 0.0–0.2)

## 2022-05-19 LAB — PHOSPHORUS: Phosphorus: 4.7 mg/dL — ABNORMAL HIGH (ref 2.5–4.6)

## 2022-05-19 LAB — MAGNESIUM: Magnesium: 1.7 mg/dL (ref 1.7–2.4)

## 2022-05-19 NOTE — TOC Progression Note (Addendum)
Transition of Care [facility]) - Progression Note    Patient Details  Name: LURLIE WIGEN MRN: [id number] Date of Birth: November 05, 1945  [facility] CM/SW Contact  Joanne Chars, LCSW Phone Number: 05/19/2022, 11:30 AM  Clinical Narrative:   CS[facility] confirmed with [facility] that [facility] can accept pt today.  CSW called [facility] and LM for Dr Cyndy Freeze regarding DC.   12: call back from Dr Lacy Duverney assistant, she will get him a message.  Informed her that today is the last day of insurance auth.  1350: TOC leadership notified, asked for help reaching MD.  5625: called Dr Cyndy Freeze office, left another message.   1530: message from [facility]  Too late for admission for today.   1545: TC Dr Christella Noa, was not aware auth expiring today, will work on DC.  CSW spoke with [facility]--they cannot take today but she was told by Navi that pt can still admit tomorrow under the current auth.  Her admissions RN is gone for the day, will plan on tomorrow.   Expected Discharge Plan: [place] Barriers to Discharge: Continued Medical Work up, Ship broker  Expected Discharge Plan and Services Expected Discharge Plan: [place]   Discharge Planning Services: CM Consult   Living arrangements for the past 2 months: [place]: [place]         Social Determinants of Health (SDOH) Interventions    Readmission Risk Interventions    03/06/2022   12:36 PM  Readmission Risk Prevention Plan  Transportation Screening Complete  HRI or Home Care Consult Complete  Social Work Consult for [place] Planning/Counseling Complete

## 2022-05-19 NOTE — Progress Notes (Signed)
Modified Barium Swallow Progress Note  Patient Details  Name: Tara [name] MRN: [id number] Date of Birth: 08-04-1946  Today's Date: 05/19/2022  Modified Barium Swallow completed.  Full report located under Chart Review in the Imaging Section.  Brief recommendations include the following:  Clinical Impression  Pt's swallow function has improved from prior MBS marked by significantly decreased volume of pharyngeal residue and absence of aspiration. Mastication and propulsion was mildly delayed with solid texture. Pt's swallow initiation was delayed 3-4 seconds to valleculae and once to pyriform sinuses. Multiple sips thin liquid via straw resulted in penetration to vocal cords (PAS 5) without immediate awareness but with delayed throat clear due to decreased timing of laryngeal closure. A second spontaneous swallow decreased amount of penetrate. There was reduced tongue base retraction with moderate vallecular residue and mild with other consistencies (valleculae and pyriform sinuses). Recommend upgrade to regular texture and to nectar thick liquids (from honey), straws allowed, crush meds and ask pt to clear her throat intermittently. ST will work with pt re: trials thin liquid with use of small sips via cup or straw.   Swallow Evaluation Recommendations       SLP Diet Recommendations: Regular solids       Medication Administration: Whole meds with puree   Supervision: Staff to assist with self feeding;Full supervision/cueing for compensatory strategies   Compensations: Slow rate;Small sips/bites;Clear throat intermittently   Postural Changes: Seated upright at 90 degrees   Oral Care Recommendations: Oral care BID        [place] Siren 05/19/2022,2:35 PM

## 2022-05-19 NOTE — Progress Notes (Signed)
Occupational Therapy Treatment Note  Pt seen for splint check after wearing blue contracture splint @ 3 hrs. Pt tolerating splint without issues. Recommend Rudene wear the Blue contracture splint 2 hrs on/2 hrs off. When not weraing the splint, she should be wearing the PRAOF splints on B feet. REcommend she sleep in the PRAFO splints.  R Hand splint - recommend 2 hour on/2 hours off during daytime hours and then wear the R hand splint throughout the night. Pt with increased spontaneous movement of L hand therefore, will hold on use of L hand splint at this time.     05/19/22 1600  OT Visit Information  Last OT Received On 05/19/22  Assistance Needed +2  History of Present Illness 76 yo woman admitted 03/15/22 with new onset seizures related to meningioma. s/p tumor resection 7/3. Recent DC from [facility] 6/22. MRI on 7/26 shows: SDH extending inferiorly along the clivus and  narrowing the foramen magnum with possible mass effect. PMH: CKD stage 3, HFpEF, a fib, meningioma.  Precautions  Precautions Fall  Precaution Comments seizures  Required Braces or Orthoses Other Brace  Other Brace bilat resting hand splints, R hand palm guard, ankle contracture boot  Restrictions  Weight Bearing Restrictions No  Pain Assessment  Pain Assessment Faces  Faces Pain Scale 2  Pain Location general grimacing with movement  Pain Descriptors / Indicators Grimacing  Pain Intervention(s) Limited activity within patient's tolerance  General Comments  General comments (skin integrity, edema, etc.) tolerated R contracture splint for 3 hours wihtou issues  Other Exercises  Other Exercises ankle PF stretch extended time (>30 sec) on R and 2 x 20 sec on L  OT - End of Session  Activity Tolerance Patient tolerated treatment well  Patient left in bed;with call bell/phone within reach  Nurse Communication Other (comment) (positioning/splint schedule)  OT Assessment/Plan  OT Plan Discharge plan remains  appropriate;Frequency remains appropriate  OT Visit Diagnosis Unsteadiness on feet (R26.81);Other abnormalities of gait and mobility (R26.89);Muscle weakness (generalized) (M62.81);Low vision, both eyes (H54.2);Other symptoms and signs involving cognitive function;Pain  OT Frequency (ACUTE ONLY) Min 2X/week  Recommendations for Other Services Other (comment)  Follow Up Recommendations Skilled nursing-short term rehab (<3 hours/day)  Assistance recommended at discharge Frequent or constant Supervision/Assistance  Patient can return home with the following Two people to help with walking and/or transfers;Two people to help with bathing/dressing/bathroom;Assistance with cooking/housework;Assistance with feeding;Direct supervision/assist for medications management;Direct supervision/assist for financial management;Assist for transportation;Help with stairs or ramp for entrance  OT Equipment Other (comment)  AM-PAC OT "6 Clicks" Daily Activity Outcome Measure (Version 2)  Help from another person eating meals? 1  Help from another person taking care of personal grooming? 2  Help from another person toileting, which includes using toliet, bedpan, or urinal? 1  Help from another person bathing (including washing, rinsing, drying)? 1  Help from another person to put on and taking off regular upper body clothing? 1  Help from another person to put on and taking off regular lower body clothing? 1  6 Click Score 7  Progressive Mobility  What is the highest level of mobility based on the progressive mobility assessment? Level 2 ([place]) - Balance while sitting on edge of bed and cannot stand  Activity Turned to back - supine  OT Goal Progression  Progress towards OT goals Progressing toward goals  Acute Rehab OT Goals  Patient Stated Goal per family for [place] to get better  OT Goal Formulation With family  Time  For Goal Achievement 05/30/22  Potential to Achieve Goals Fair  ADL Goals  Pt Will  Perform Grooming with mod assist;sitting  Pt Will Perform Upper Body Bathing with mod assist;sitting  Pt Will Perform Lower Body Bathing with max assist;sit to/from stand  Pt Will Perform Lower Body Dressing with modified independence;sitting/lateral leans;sit to/from stand  Pt Will Transfer to Toilet with max assist;with +2 assist;bedside commode  Pt Will Perform Toileting - Clothing Manipulation and hygiene with modified independence;sitting/lateral leans;sit to/from stand  Additional ADL Goal #1 Pt will track to midline to find family members with moderate directional cues and max assist to turn head  Additional ADL Goal #2 Pt will accurately rspond to questions when given choice of 2 answers  Additional ADL Goal #3 Ptwill maintain midlien posutral control EOB with min A in preparation for ADL tasks  Additional ADL Goal #4 pt will toelrate B resting hand splints at nights to prevent further flexor tightness B hands  OT Time Calculation  OT Start Time (ACUTE ONLY) 1550  OT Stop Time (ACUTE ONLY) 1605  OT Time Calculation (min) 15 min  OT General Charges  $OT Visit 1 Visit  OT Treatments  $Orthotics/Prosthetics Check 8-22 mins   Maurie Boettcher, OT/L   Acute OT Clinical Specialist Acute Rehabilitation Services Pager [phone] Office [phone]

## 2022-05-19 NOTE — Progress Notes (Signed)
Occupational Therapy Treatment Patient Details Name: Tara [name] MRN: [id number] DOB: 08-31-1946 Today's Date: 05/19/2022   History of present illness 76 yo woman admitted 03/15/22 with new onset seizures related to meningioma. s/p tumor resection 7/3. Recent DC from [facility] 6/22. MRI on 7/26 shows: SDH extending inferiorly along the clivus and  narrowing the foramen magnum with possible mass effect. PMH: CKD stage 3, HFpEF, a fib, meningioma.   OT comments  Seen as cotreat with PT to progress mobility. Once EOB with max A, able to maintain midline postural control @ 3 min with S. Used the Clarise Cruz Plus to stand and transfer to recliner and for pericare with increased standing tolerance noted. Increased initiation to reach for handles and shift weight anteriorly in anticipation of sit - stand.   Once supported in seated position, pt able to wash her face after cues for initiation. VSS although appears to desat on RA with increased exertion - difficult to read accurate pleth due to position of sensor on finger during transfers. Continue to recommend rehab at [facility]. Will return to monitor splints.    Recommendations for follow up therapy are one component of a multi-disciplinary discharge planning process, led by the attending physician.  Recommendations may be updated based on patient status, additional functional criteria and insurance authorization.    Follow Up Recommendations  Skilled nursing-short term rehab (<3 hours/day)    Assistance Recommended at Discharge Frequent or constant Supervision/Assistance  Patient can return home with the following  Two people to help with walking and/or transfers;Two people to help with bathing/dressing/bathroom;Assistance with cooking/housework;Assistance with feeding;Direct supervision/assist for medications management;Direct supervision/assist for financial management;Assist for transportation;Help with stairs or ramp for entrance   Equipment Recommendations   Other (comment) (TBD)    Recommendations for Other Services Other (comment)    Precautions / Restrictions Precautions Precautions: Fall Precaution Comments: seizures Required Braces or Orthoses: Other Brace Other Brace: bilat resting hand splints, R hand palm guard, ankle contracture boot       Mobility Bed Mobility Overal bed mobility: Needs Assistance Bed Mobility: Sidelying to Sit     Supine to sit: Max assist, +2 for safety/equipment     General bed mobility comments: once movement initiated,pt was able to help right herself to midlien; appearead to be demonstrating "scotting" movement"; increased initiation of anterioro weight shift    Transfers Overall transfer level: Needs assistance                 General transfer comment: stood from bed with sara plus then transferred to recliner then stood for pericare due to incontinence; increased RR with all mobility Transfer via Lift Equipment: Teacher, early years/pre balance-Leahy Scale: Fair Sitting balance - Comments: Increased midline postural control EOB; ableto sustain sitting balance at least 3 min  with S     Standing balance-Leahy Scale: Zero                             ADL either performed or assessed with clinical judgement   ADL Overall ADL's : Needs assistance/impaired Eating/Feeding: NPO Eating/Feeding Details (indicate cue type and reason): scheduled for MBS today Grooming: Maximal assistance Grooming Details (indicate cue type and reason): does best with backwqrd chaining during activity Upper Body Bathing: Total assistance   Lower Body Bathing: Total assistance   Upper Body Dressing : Total assistance   Lower Body Dressing: Total assistance   Toilet  Transfer: Maximal assistance;+2 for safety/equipment (with use of Sarah plus)   Toileting- Clothing Manipulation and Hygiene: Total assistance Toileting - Clothing Manipulation Details (indicate cue type and reason):  incontinenet of BM - cleaned in standing     Functional mobility during ADLs: Maximal assistance;+2 for physical assistance      Extremity/Trunk Assessment Upper Extremity Assessment Upper Extremity Assessment: RUE deficits/detail;LUE deficits/detail RUE Deficits / Details: movement out of flexor pattern improved, however more rigid than L; increased aiblity to plce hand on sarah plus handle and release RUE Coordination: decreased fine motor;decreased gross motor LUE Deficits / Details: Increased spontaneous movement; able to bring hand to mouth to wash mouth however unable to follow through and wash eyes; increased spontaneous movemetn; perseverative movement noted LUE Coordination: decreased fine motor;decreased gross motor   Lower Extremity Assessment Lower Extremity Assessment: Defer to PT evaluation        Vision   Additional Comments: improved visual attention   Perception Perception Perception: Impaired   Praxis Praxis Praxis: Impaired Praxis Impairment Details: Initiation;Motor planning;Perseveration    Cognition Arousal/Alertness: Awake/alert Behavior During Therapy: Flat affect, Anxious (anxious at times) Overall Cognitive Status: Impaired/Different from baseline Area of Impairment: Orientation, Attention, Memory, Following commands, Awareness, Safety/judgement, Problem solving                 Orientation Level: Disoriented to, Place, Time, Situation Current Attention Level: Focused, Sustained (sustained at times)   Following Commands: Follows one step commands inconsistently, Follows one step commands with increased time Safety/Judgement: Decreased awareness of safety, Decreased awareness of deficits Awareness: Intellectual Problem Solving: Slow processing, Decreased initiation, Difficulty sequencing, Requires verbal cues, Requires tactile cues General Comments: increased initiation of tasks; ablet o read therapist's name on name tag; answering yes/no  questions; spontaneous speech at times        Exercises      Shoulder Instructions       General Comments Family friend present during session; tolerating resting hand splints without issues; R foot drop splint adapted with Gel padding to reduce pressure on achiliies; foam wedge removed    Pertinent Vitals/ Pain       Pain Assessment Pain Assessment: Faces Faces Pain Scale: Hurts a little bit Pain Location: general grimacing with movement Pain Descriptors / Indicators: Grimacing Pain Intervention(s): Limited activity within patient's tolerance  Home Living                                          Prior Functioning/Environment              Frequency  Min 2X/week        Progress Toward Goals  OT Goals(current goals can now be found in the care plan section)  Progress towards OT goals: Progressing toward goals  Acute Rehab OT Goals Patient Stated Goal: per family for [place] to get better OT Goal Formulation: With family Time For Goal Achievement: 05/30/22 Potential to Achieve Goals: Fair ADL Goals Pt Will Perform Grooming: with mod assist;sitting Pt Will Perform Upper Body Bathing: with mod assist;sitting Pt Will Perform Lower Body Bathing: with max assist;sit to/from stand Pt Will Perform Lower Body Dressing: with modified independence;sitting/lateral leans;sit to/from stand Pt Will Transfer to Toilet: with max assist;with +2 assist;bedside commode Pt Will Perform Toileting - Clothing Manipulation and hygiene: with modified independence;sitting/lateral leans;sit to/from stand Additional ADL Goal #1: Pt will track to midline to  find family members with moderate directional cues and max assist to turn head Additional ADL Goal #2: Pt will accurately rspond to questions when given choice of 2 answers Additional ADL Goal #3: Ptwill maintain midlien posutral control EOB with min A in preparation for ADL tasks Additional ADL Goal #4: pt will toelrate  B resting hand splints at nights to prevent further flexor tightness B hands  Plan Discharge plan remains appropriate;Frequency remains appropriate    Co-evaluation    PT/OT/SLP Co-Evaluation/Treatment: Yes Reason for Co-Treatment: Complexity of the patient's impairments (multi-system involvement);Necessary to address cognition/behavior during functional activity;For patient/therapist safety   OT goals addressed during session: ADL's and self-care;Strengthening/ROM      AM-PAC OT "6 Clicks" Daily Activity     Outcome Measure   Help from another person eating meals?: Total Help from another person taking care of personal grooming?: A Lot Help from another person toileting, which includes using toliet, bedpan, or urinal?: Total Help from another person bathing (including washing, rinsing, drying)?: Total Help from another person to put on and taking off regular upper body clothing?: Total Help from another person to put on and taking off regular lower body clothing?: Total 6 Click Score: 7    End of Session Equipment Utilized During Treatment: Oxygen (2L)  OT Visit Diagnosis: Unsteadiness on feet (R26.81);Other abnormalities of gait and mobility (R26.89);Muscle weakness (generalized) (M62.81);Low vision, both eyes (H54.2);Other symptoms and signs involving cognitive function;Pain Pain - part of body:  (generalized with movement)   Activity Tolerance Patient tolerated treatment well   Patient Left in chair;with call bell/phone within reach;with chair alarm set;with family/visitor present   Nurse Communication Need for lift equipment Mayo Ao)        Time: 1517-6160 OT Time Calculation (min): 37 min  Charges: OT General Charges $OT Visit: 1 Visit OT Treatments $Self Care/Home Management : 8-22 mins  Maurie Boettcher, OT/L   Acute OT Clinical Specialist [place] Pager [phone] Office [phone]   [facility] 05/19/2022, 10:59 AM

## 2022-05-19 NOTE — Progress Notes (Signed)
Physical Therapy Treatment Patient Details Name: Tara [name] MRN: [id number] DOB: [birth year] Today's Date: 05/19/2022   History of Present Illness 76 yo woman admitted 03/15/22 with new onset seizures related to meningioma. s/p tumor resection 7/3. Recent DC from [facility] 6/22. MRI on 7/26 shows: SDH extending inferiorly along the clivus and  narrowing the foramen magnum with possible mass effect. PMH: CKD stage 3, HFpEF, a fib, meningioma.    PT Comments    Patient progressing with initiation of mobility for supine to sit and with sitting balance able to sit unsupported about 3 minutes during prep for OOB.  She held her arms away from sides when initially upright and used UE 's some with assist initially for balance.  Modified ankle contracture boot with OT help and hopeful for improved tolerance.  She stood for about 30 seconds during pericare up in [employer].  She continues to benefit from skilled PT in the acute setting.  Recommend follow up SNF level rehab prior to home with family support.   Recommendations for follow up therapy are one component of a multi-disciplinary discharge planning process, led by the attending physician.  Recommendations may be updated based on patient status, additional functional criteria and insurance authorization.  Follow Up Recommendations  Skilled nursing-short term rehab (<3 hours/day) Can patient physically be transported by private vehicle: No   Assistance Recommended at Discharge Frequent or constant Supervision/Assistance  Patient can return home with the following Two people to help with walking and/or transfers;Two people to help with bathing/dressing/bathroom;Assist for transportation;Assistance with feeding;Assistance with Education officer, environmental (measurements PT);Wheelchair cushion (measurements PT);Hospital bed;Other (comment);BSC/3in1    Recommendations for Other Services       Precautions / Restrictions  Precautions Precautions: Fall Precaution Comments: seizures Required Braces or Orthoses: Other Brace Other Brace: bilat resting hand splints, R hand palm guard, ankle contracture boot     Mobility  Bed Mobility Overal bed mobility: Needs Assistance       Supine to sit: Max assist, +2 for safety/equipment     General bed mobility comments: once movement initiated, pt was able to help right herself to midlien; appearead to be demonstrating "scooting" movement"; increased initiation of anterior weight shift    Transfers Overall transfer level: Needs assistance   Transfers: Sit to/from Stand, Bed to chair/wheelchair/BSC Sit to Stand: Total assist Stand pivot transfers: Total assist         General transfer comment: stood from bed with sara plus then transferred to recliner then stood for pericare due to incontinence; increased RR with all mobility; still with posterior lean in [place] and limited standing during pericare, needing max cues to maintain upright Transfer via Lift Equipment: Marketing executive    Modified Rankin (Stroke Patients Only) Modified Rankin (Stroke Patients Only) Pre-Morbid Rankin Score: Moderately severe disability Modified Rankin: Severe disability     Balance Overall balance assessment: Needs assistance Sitting-balance support: Feet supported, Bilateral upper extremity supported Sitting balance-Leahy Scale: Poor Sitting balance - Comments: Increased midline postural control EOB; ableto sustain sitting balance at least 3 min  with S   Standing balance support: Bilateral upper extremity supported Standing balance-Leahy Scale: Zero Standing balance comment: supported in [place] for standing today up to 20-30 seconds  Cognition Arousal/Alertness: Awake/alert Behavior During Therapy: Flat affect, Anxious (anxious at times with  mobility) Overall Cognitive Status: Impaired/Different from baseline Area of Impairment: Orientation, Attention, Memory, Following commands, Awareness, Safety/judgement, Problem solving                 Orientation Level: Disoriented to, Place, Time, Situation Current Attention Level: Focused, Sustained Memory: Decreased recall of precautions, Decreased short-term memory Following Commands: Follows one step commands inconsistently, Follows one step commands with increased time Safety/Judgement: Decreased awareness of safety, Decreased awareness of deficits   Problem Solving: Slow processing, Decreased initiation, Difficulty sequencing, Requires verbal cues, Requires tactile cues General Comments: increased initiation of tasks; ablet o read therapist's name on name tag; answering yes/no questions; spontaneous speech at times        Exercises Other Exercises Other Exercises: ankle PF stretch extended time (>30 sec) on R and 2 x 20 sec on L    General Comments General comments (skin integrity, edema, etc.): Adjusted ankle contracture boot with OT help for reduced pressure on calf and achilles; skin check after 1 hour without signs of excess pressure.      Pertinent Vitals/Pain Pain Assessment Pain Assessment: Faces Faces Pain Scale: No hurt    Home Living                          Prior Function            PT Goals (current goals can now be found in the care plan section) Progress towards PT goals: Progressing toward goals    Frequency    Min 3X/week      PT Plan Current plan remains appropriate    Co-evaluation PT/OT/SLP Co-Evaluation/Treatment: Yes Reason for Co-Treatment: Complexity of the patient's impairments (multi-system involvement);Necessary to address cognition/behavior during functional activity;To address functional/ADL transfers;For patient/therapist safety PT goals addressed during session: Mobility/safety with  mobility;Balance;Strengthening/ROM OT goals addressed during session: ADL's and self-care;Strengthening/ROM      AM-PAC PT "6 Clicks" Mobility   Outcome Measure  Help needed turning from your back to your side while in a flat bed without using bedrails?: A Lot Help needed moving from lying on your back to sitting on the side of a flat bed without using bedrails?: A Lot Help needed moving to and from a bed to a chair (including a wheelchair)?: Total Help needed standing up from a chair using your arms (e.g., wheelchair or bedside chair)?: Total Help needed to walk in hospital room?: Total Help needed climbing 3-5 steps with a railing? : Total 6 Click Score: 8    End of Session Equipment Utilized During Treatment: Oxygen Clarise Cruz Plus) Activity Tolerance: Patient tolerated treatment well Patient left: in chair;with call bell/phone within reach;with chair alarm set;with family/visitor present Nurse Communication: Mobility status;Need for lift equipment PT Visit Diagnosis: Other abnormalities of gait and mobility (R26.89);Other symptoms and signs involving the nervous system (R29.898)     Time: 7654-6503 PT Time Calculation (min) (ACUTE ONLY): 37 min  Charges:  $Therapeutic Activity: 8-22 mins                     Magda Kiel, PT Acute Rehabilitation Services Office:[phone] 05/19/2022    [name] 05/19/2022, 1:48 PM

## 2022-05-19 NOTE — Progress Notes (Signed)
[facility] PHYSICAL MEDICINE AND REHABILITATION  CONSULT SERVICE NOTE   Pt definitively more alert and attentive today. Actually responded appropriately to several questions I asked her! Made consistent eye contact with me. Still smacking lips. Tolerating ritalin thus far although HR was up over 100 this am at 10am (may have coincided with therapy) but was only in 90's when I examined her.  Pt is wearing RLE night splint without any discomfort and heel is in neutral position.  Keppra decreased to '750mg'$  bid per hospitalist.   BP (!) 121/56   Pulse (!) 116   Temp 98.6 F (37 C)   Resp (!) 27   Ht '4\' 9"'$  (1.448 m)   Wt 74.7 kg   SpO2 100%   BMI 35.64 kg/m     Continue with current plan, mobilizing when possible in therapy. Looks better today, but still will need longer term rehab to maximize functional gains. .   Will continue to follow at a distance.    Meredith Staggers, MD, [place] Director Rehabilitation Services 05/19/2022

## 2022-05-19 NOTE — Progress Notes (Signed)
PROGRESS NOTE    Tara [name]  YWV:[id number] DOB: 08-14-1946 DOA: 03/15/2022 PCP: [name], MD  Chief Complaint  Patient presents with   Seizures    Brief Narrative:  76 year old African-American female PMH of anxiety, depression, type II DM, HLD, PAF, on Eliquis, CKD 3B, breast cancer SP chemoradiation presented to hospital on 6/24 with new onset seizures. Known history of sphenoidal meningioma that was noted to be increased in size on the CT scan with surrounding edema so she is transferred from [facility] for further evaluation.  She underwent LTM with no further seizures and was evaluated by neurosurgery and underwent bifrontal craniotomy for meningioma resection on 03/24/2020 and was transferred to the intensive care unit postoperatively with PCCM consulting while she was in ICU. 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  Treated with Unasyn for 5 days. 7/11 recurrent sz like activity. Not correlated on EEG. CT unchanged.  7/13 neurology signed off. 7/19 started on ABX for sepsis then transition to Unasyn. 7/21 palliative care was consulted. 7/23 CT chest and abdomen was performed negative for PE or significant pneumonia, without any hemorrhage 7/25 MRI brain without contrast shows increasing mass effect, SDH as well as subacute/acute small scattered infarct. 7/26 tube feeds resumed, neurology reconsulted.  Eliquis resumed per neurosurgery 7/27 oxygenation improving, mentation improving, neurology following.  CTA head and neck without any LVO.  Lower extremity Doppler without any DVT.  Echo shows new systolic dysfunction with global hypokinesis EF 40 to 45% and significant RV failure. 7/30 had another aspiration event.  Tube feeds on hold. Now on NRB. Remains full code. No tracheostomy per husband. Per Neurosurgery, give at least two weeks if not more to see if she improves.  8/1 oxygenation improving again to 5 L.  Trickle tube feeds started. 8/9 on D1 honey thick liquid diet.  Calorie count initiated. 8/13 oxygenation improving to 2 L. 8/14 core track removed nocturnal tube feeds stopped. 8/15 leukocytosis worsening, abdomen distended, x-ray abdomen shows evidence of possible sigmoid volvulus, GI consulted, CT abdomen negative for any volvulus and only shows redundant sigmoid colon.  Will resume diet. 8/16-8/22: leukocytosis of unknown etiology.    Assessment & Plan:   Principal Problem:   Seizure ([place]) Active Problems:   Meningioma, cerebral (HCC)   Leukocytosis   Lethargy   Acute on chronic combined systolic and diastolic CHF (congestive heart failure) (HCC)   Acute respiratory failure with hypoxia (HCC)   Hypertension   Anxiety and depression   GERD   Hyperlipidemia   Hypokalemia   AKI (acute kidney injury) ([place])   Atrial fibrillation, chronic (HCC)   Protein-calorie malnutrition, moderate (HCC)   Meningioma (HCC)   Pressure injury of skin   Aspiration into airway   Palliative care encounter   Dysfunction of right cardiac ventricle   Acute postoperative anemia due to expected blood loss   Dysphagia   Assessment and Plan: * Seizure ([place]) Continue Keppra EEG 8/24 with moderate diffuse encephalopathy, no seizures or epileptiform discharges PM&R (8/24 note) recommending considering decreasing keppra? I discussed with neurology on call who were ok decreasing to 750 mg BID (8/24).  Will continue to monitor.  Meningioma, cerebral [facility]) Per neurosurgery  Lethargy PM&R recommending considering ritalin 5 mg twice daily  I  discussed informally with cards, in absence of RVR and decompensated HF can consider this - started 8/26, will follow response  Leukocytosis Recurrent and persistent.  Know specific infectious source identified although patient has been recently treated for recurrent aspiration pneumonia. Recently completed course of unasyn 8/3 CXR 8/19 concerning for edema vs pneumia Will repeat CXR without acute abnormality, low lung volumes  Acute respiratory failure with hypoxia (HCC) Secondary to pneumonia with associated tachypnea. Hypoxia improved but not resolved. Repeat chest x-ray with edema vs pneumonia. Patient with leukocytosis and no other infectious symptoms. Down to 1 L/min with Lasix. -Wean as able -repeat CXR with low lung volumes, no acute abnormality  Acute on chronic combined systolic and diastolic CHF (congestive heart failure) (HCC) Most recent Transthoracic Echocardiogram with LVEF of 40-45% (down from 55%) with global hypokinesis and associated RV dysfunction noted. Patient with acute heart failure this admission that responded to Lasix. Currently resolved. Not on GDMT secondary to recent hypotension. -Continue Lasix  Hypertension - Continue lasix, currently on midodrine  Anxiety and depression Previously on Zoloft and Klonopin which are discontinued.  GERD -Continue Protonix  Atrial fibrillation, chronic (HCC) Patient is on rate/rhythm control and on anticoagulation. -Continue amiodarone and Eliquis  AKI (acute kidney injury) ([place]) Possibly secondary to lasix although now has stabilized. Weight appears to have downtrended from admission but is stable for the past week.  Hypokalemia Replace and follow  Hyperlipidemia -Continue Crestor  Dysphagia Previously with Alichia Alridge Cortrak and tube feedings. Evaluated/followed by speech therapy. -Speech therapy recommendations (8/15): Diet recommendations: Dysphagia 2 (fine chop);Honey-thick liquid Liquids provided via: Cup;Straw Medication Administration: Crushed with puree Supervision: Full supervision/cueing for compensatory strategies;Staff to assist with self feeding Compensations: Slow  rate;Small sips/bites;Multiple dry swallows after each bite/sip;Clear throat intermittently Postural Changes and/or Swallow Maneuvers: Seated upright 90 degrees  Acute postoperative anemia due to expected blood loss Acute anemia down to 6.5 requiring 2 units of PRBC. Hemoglobin currently stable.  Dysfunction of right cardiac ventricle Acute/chronic PE ruled out. Possibly related to pneumonia. Recommendation for repeat Transthoracic Echocardiogram in 6 months. Patient can follow-up with pulmonology.  Hypernatremia-resolved as of 05/07/2022 Resolved.  Thrombocytopenia (HCC)-resolved as of 05/07/2022 Resolved.      DVT prophylaxis: eliquis Code Status: full Family Communication: caregiver at bedside Disposition:   Status is: Inpatient Remains inpatient appropriate because: per primary   Consultants:  Neurosurgery Neurology Pccm Palliative care GI  Procedures:  See significant events above  Antimicrobials:  Anti-infectives (From admission, onward)    Start     Dose/Rate Route Frequency Ordered Stop   04/21/22 0000  Ampicillin-Sulbactam (UNASYN) 3 g in sodium chloride 0.9 % 100 mL IVPB        3 g 200 mL/hr over 30 Minutes Intravenous Every 8 hours 04/20/22 1913 04/25/22 0018   04/12/22 1500  Ampicillin-Sulbactam (UNASYN) 3 g in sodium chloride 0.9 % 100 mL IVPB  Status:  Discontinued        3 g 200 mL/hr over 30 Minutes Intravenous Every 8 hours 04/12/22 1409 04/20/22 1913   04/11/22 1845  Ampicillin-Sulbactam (UNASYN) 3 g in sodium chloride 0.9 % 100 mL IVPB  Status:  Discontinued        3 g 200 mL/hr over 30 Minutes Intravenous Every 8 hours 04/11/22 1754 04/12/22 1409   04/10/22 1200  vancomycin (VANCOREADY) IVPB 750 mg/150 mL  Status:  Discontinued        750 mg 150 mL/hr over 60 Minutes Intravenous Every 24 hours 04/09/22 1034 04/11/22 1100  04/10/22 0900  metroNIDAZOLE (FLAGYL) IVPB 500 mg  Status:  Discontinued        500 mg 100 mL/hr over 60 Minutes  Intravenous Every 12 hours 04/10/22 0754 04/11/22 1754   04/09/22 2300  ceFEPIme (MAXIPIME) 2 g in sodium chloride 0.9 % 100 mL IVPB  Status:  Discontinued        2 g 200 mL/hr over 30 Minutes Intravenous Every 12 hours 04/09/22 1035 04/11/22 1717   04/09/22 1045  vancomycin (VANCOREADY) IVPB 1750 mg/350 mL        1,750 mg 175 mL/hr over 120 Minutes Intravenous  Once 04/09/22 0953 04/09/22 1344   04/09/22 1045  ceFEPIme (MAXIPIME) 2 g in sodium chloride 0.9 % 100 mL IVPB        2 g 200 mL/hr over 30 Minutes Intravenous  Once 04/09/22 0953 04/09/22 1524   03/27/22 0945  Ampicillin-Sulbactam (UNASYN) 3 g in sodium chloride 0.9 % 100 mL IVPB        3 g 200 mL/hr over 30 Minutes Intravenous Every 6 hours 03/27/22 0859 04/01/22 0518   03/25/22 0600  ceFAZolin (ANCEF) IVPB 2g/100 mL premix        2 g 200 mL/hr over 30 Minutes Intravenous On call to O.R. 03/24/22 1215 03/24/22 1440       Subjective: No new complaints  Objective: Vitals:   05/19/22 0806 05/19/22 1000 05/19/22 1200 05/19/22 1659  BP: (!) 121/56  134/70 132/73  Pulse: 88 (!) 116 95 93  Resp: (!) 23 (!) 27 (!) 22 (!) 21  Temp: 98.6 F (37 C)  98.4 F (36.9 C) 98.6 F (37 C)  TempSrc:   Axillary Axillary  SpO2: 95% 100% 92% 97%  Weight:      Height:        Intake/Output Summary (Last 24 hours) at 05/19/2022 1701 Last data filed at 05/19/2022 1200 Gross per 24 hour  Intake 520 ml  Output 450 ml  Net 70 ml   Filed Weights   05/03/22 0345 05/04/22 0413 05/06/22 0500  Weight: 73.6 kg 74.7 kg 74.7 kg    Examination:  General: No acute distress. Cardiovascular: rRR Lungs: unlabored Abdomen: Soft, nontender, nondistended  Neurological: some echolalia, following commands moving all extremities Extremities: No clubbing or cyanosis. No edema.   Data Reviewed: I have personally reviewed following labs and imaging studies  CBC: Recent Labs  Lab 05/14/22 1048 05/15/22 0555 05/16/22 0243 05/17/22 0227  05/18/22 0530 05/19/22 0302  WBC 18.7* 17.7* 18.5* 17.3* 16.5* 16.5*  NEUTROABS 14.3* 14.0* 14.7* 13.5*  --  12.8*  HGB 9.6* 9.2* 8.9* 8.6* 9.2* 8.9*  HCT 29.7* 28.6* 27.3* 26.4* 29.4* 27.3*  MCV 87.4 85.1 86.4 84.9 88.0 85.6  PLT 260 257 242 222 252 102    Basic Metabolic Panel: Recent Labs  Lab 05/15/22 0555 05/16/22 0243 05/17/22 0227 05/18/22 0530 05/19/22 0302  NA 140 139 140 144 143  K 3.8 3.5 3.1* 4.0 3.7  CL 107 107 109 112* 110  CO2 24 22 21* 20* 23  GLUCOSE 112* 116* 120* 108* 105*  BUN 22 27* 26* 27* 25*  CREATININE 1.03* 1.10* 0.97 0.97 1.15*  CALCIUM 8.8* 8.7* 8.6* 8.9 8.7*  MG 1.8 1.8 1.7  --  1.7  PHOS 4.6 4.7* 4.1  --  4.7*    GFR: Estimated Creatinine Clearance: 34.8 mL/min (Gerrie Castiglia) (by C-G formula based on SCr of 1.15 mg/dL (H)).  Liver Function Tests: Recent Labs  Lab 05/14/22 1048 05/15/22  9702 05/16/22 0243 05/17/22 0227 05/19/22 0302  AST '28 27 23 21 25  '$ ALT 33 34 '25 27 28  '$ ALKPHOS 89 92 96 98 93  BILITOT 0.3 0.4 0.5 0.4 0.5  PROT 6.3* 6.6 6.2* 6.4* 6.5  ALBUMIN 2.6* 2.7* 2.6* 2.7* 2.7*    CBG: No results for input(s): "GLUCAP" in the last 168 hours.   No results found for this or any previous visit (from the past 240 hour(s)).       Radiology Studies: DG Swallowing Func-Speech Pathology  Result Date: 05/19/2022 Table formatting from the original result was not included. Objective Swallowing Evaluation: Type of Study: MBS-Modified Barium Swallow Study  Patient Details Name: EMMALIE HAIGH MRN: [id number] Date of Birth: [birth year] Today's Date: 05/19/2022 Time: SLP Start Time (ACUTE ONLY): 2774 -SLP Stop Time (ACUTE ONLY): 1287 SLP Time Calculation (min) (ACUTE ONLY): 16 min Past Medical History: Past Medical History: Diagnosis Date  Abnormal mammogram of right breast 07/29/2017  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  Anxiety   Breast cancer ([place]) 2008  right - s/p lumpectomy->chemo, radiation  Depression   Dysrhythmia   hx SVT  Family history of  colon cancer   Family history of prostate cancer   GERD (gastroesophageal reflux disease)   H/O: hysterectomy   Hiatal hernia   History of cancer chemotherapy   History of radiation therapy   Hyperlipidemia   Hypertension 01/25/2018  Nonischemic cardiomyopathy ([place])   Personal history of radiation therapy 01/05/209  SVT (supraventricular tachycardia) ([place])   short RP SVT documented 8/67  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[place])   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)  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 Past Surgical History: Past Surgical History: Procedure Laterality Date  ABDOMINAL HYSTERECTOMY  1994  fibroids,   BIOPSY  06/19/2016  Procedure: BIOPSY;  Surgeon: Daneil Dolin, MD;  [facility];  Service: Endoscopy;;  gastric duodenum  BREAST BIOPSY Right 12/16/2006  malignant  BREAST EXCISIONAL BIOPSY Right 2018  benign lumpectomy  BREAST LUMPECTOMY Right 02/2007  BREAST LUMPECTOMY WITH RADIOACTIVE SEED LOCALIZATION Right 07/29/2017  Procedure: RIGHT BREAST LUMPECTOMY WITH RADIOACTIVE SEED LOCALIZATION;  Surgeon: Fanny Skates, MD;  Location: [place];  Service: General;  Laterality: Right;  BREAST SURGERY Right 2008  lumpectomy, cancer  CARDIAC CATHETERIZATION    CHOLECYSTECTOMY  1999  COLONOSCOPY  2008  Dr. Oneida Alar: multiple polyps. Path not available at time of visit.   COLONOSCOPY WITH PROPOFOL N/Deaire Mcwhirter 04/25/2014  Dr. Oneida Alar: Multiple tubular adenomas removed. Diverticulosis. Moderate internal hemorrhoids. Next colonoscopy planned for August 2018.  CRANIOTOMY Bilateral 03/24/2022  Procedure: Bifrontal craniotomy for resection of meningioma;  Surgeon: Ashok Pall, MD;  Location: [address];  Service: Neurosurgery;  Laterality: Bilateral;  ESOPHAGOGASTRODUODENOSCOPY (EGD) WITH PROPOFOL N/Mehreen Azizi 06/19/2016  Procedure: ESOPHAGOGASTRODUODENOSCOPY (EGD) WITH PROPOFOL;  Surgeon: Daneil Dolin, MD;  [facility];  Service: Endoscopy;  Laterality: N/Taralynn Quiett;  MASTECTOMY W/ SENTINEL  NODE BIOPSY Right 06/09/2018  Procedure: RIGHT TOTAL MASTECTOMY WITH SENTINEL LYMPH NODE BIOPSY;  Surgeon: Fanny Skates, MD;  Location: [place];  Service: General;  Laterality: Right;  POLYPECTOMY N/Torry Istre 04/25/2014  Procedure: POLYPECTOMY;  Surgeon: Danie Binder, MD;  Location: [facility];  Service: Endoscopy;  Laterality: N/Cesare Sumlin;  Ascending and Decending Colon x3 , Transverse colon x2, rectal  PORTACATH PLACEMENT N/Charleen Madera 06/09/2018  Procedure: INSERTION PORT-Kajol Crispen-CATH;  Surgeon: Fanny Skates, MD;  Location: [place];  Service: General;  Laterality: N/Adron Geisel;  RIGHT/LEFT HEART CATH AND CORONARY ANGIOGRAPHY N/Rome Schlauch 07/10/2017  Procedure: RIGHT/LEFT HEART CATH AND CORONARY ANGIOGRAPHY;  Surgeon: Larey Dresser, MD;  Location: [place] [facility];  Service: Cardiovascular;  Laterality: N/Eduin Friedel; HPI: Pt is Phyllistine Domingos 76 y/o who presented 6/24 with new onset seizures. 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 EEG negative for seizures. Neurosurgey consulted and did not recommend meningioma resection as of 6/27. Decline in swallow function noted overnight 6/27. Dx acute toxic metabolic encephalopathy suspected secondary to keppra. Pt s/p Bifrontal craniotomy for resection of meningioma 7/3.  Cortrak 7/7. 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  CXR 8/6 concerning for patchy airspace disease. PMH: HTN, HLD, GERD, CKD stage IIIb, chronic HFpEF, meningioma, breast CA-s/p right lumpectomy and chemoradiation in 2008, depression/anxiety. BSE 03/11/22: regular texture diet and thin liquids without need for follow up. MBS 8/8 recommended Dys 1, honey thick.  Subjective: daughter present; pt awake, alert, pleasant, participative  Recommendations for follow up therapy are one component of Jeremian Whitby multi-disciplinary discharge planning process, led by the attending physician.  Recommendations may be updated based on patient status, additional functional criteria and insurance authorization. Assessment / Plan / Recommendation    05/19/2022   1:18 PM Clinical Impressions Clinical Impression Pt's swallow function has improved from prior MBS marked by significantly decreased volume of pharyngeal residue and absence of aspiration. Mastication and propulsion was mildly delayed with solid texture. Pt's swallow initiation was delayed 3-4 seconds to valleculae and once to pyriform sinuses. Multiple sips thin liquid via straw resulted in penetration to vocal cords (PAS 5) without immediate awareness but with delayed throat clear due to decreased timing of laryngeal closure. Sayana Salley second spontaneous swallow decreased amount of penetrate. There was reduced tongue base retraction with moderate vallecular residue and mild with other consistencies (valleculae and pyriform sinuses). Recommend upgrade to regular texture and to nectar thick liquids (from honey), straws allowed, crush meds and ask pt to clear her throat intermittently. ST will work with pt re: trials thin liquid with use of small sips via cup or straw. SLP Visit Diagnosis Dysphagia, oropharyngeal phase (R13.12) Impact on safety and function Mild aspiration risk     05/19/2022   1:18 PM Treatment Recommendations Treatment Recommendations Therapy as outlined in treatment plan below     05/19/2022   1:18 PM Prognosis Prognosis for Safe Diet Advancement Good Barriers to Reach Goals Cognitive deficits   05/19/2022   1:18 PM Diet Recommendations SLP Diet Recommendations Regular solids Medication Administration Whole meds with puree Compensations Slow rate;Small sips/bites;Clear throat intermittently Postural Changes Seated upright at 90 degrees     05/19/2022   1:18 PM Other Recommendations Oral Care Recommendations Oral care BID Follow Up Recommendations Skilled nursing-short term rehab (<3 hours/day) Assistance recommended at discharge Frequent or constant Supervision/Assistance Functional Status Assessment Patient has had Jazzy Parmer recent decline in their functional status and demonstrates the ability to make  significant improvements in function in Devonn Giampietro reasonable and predictable amount of time.   05/19/2022   1:18 PM Frequency and Duration  Speech Therapy Frequency (ACUTE ONLY) min 2x/week Treatment Duration 2 weeks     05/19/2022   1:18 PM Oral Phase Oral Phase Impaired Oral - Honey Cup NT Oral - Nectar Teaspoon WFL Oral - Nectar Cup WFL Oral - Nectar Straw WFL Oral - Thin Teaspoon WFL Oral - Thin Cup WFL Oral - Thin Straw WFL Oral - Puree WFL Oral - Mech Soft NT Oral - Regular Delayed oral transit  05/19/2022   1:18 PM Pharyngeal Phase Pharyngeal Phase Impaired Pharyngeal- Honey Cup NT Pharyngeal- Nectar Teaspoon Delayed swallow initiation-vallecula Pharyngeal- Nectar Cup Delayed swallow initiation-vallecula Pharyngeal Material does not enter airway Pharyngeal- Nectar Straw Delayed swallow initiation-pyriform sinuses Pharyngeal Material does not enter airway Pharyngeal- Thin Teaspoon Delayed swallow initiation-pyriform sinuses Pharyngeal- Thin Cup Penetration/Aspiration during swallow;Pharyngeal residue - pyriform;Pharyngeal residue - valleculae Pharyngeal Material enters airway, remains ABOVE vocal cords and not ejected out Pharyngeal- Thin Straw Pharyngeal residue - valleculae;Pharyngeal residue - pyriform;Penetration/Aspiration during swallow Pharyngeal Material enters airway, CONTACTS cords and not ejected out Pharyngeal- Puree NT Pharyngeal- Mechanical Soft NT Pharyngeal- Regular Pharyngeal residue - valleculae    05/19/2022   1:18 PM Cervical Esophageal Phase  Cervical Esophageal Phase -- Houston Siren 05/19/2022, 2:34 PM                          Scheduled Meds:  amiodarone  200 mg Oral Daily   apixaban  5 mg Oral BID   budesonide (PULMICORT) nebulizer solution  0.25 mg Nebulization BID   ciprofloxacin  2 drop Right Eye Q4H while awake   furosemide  40 mg Oral QODAY   lacosamide  200 mg Oral BID   levETIRAcetam  750 mg Oral BID   methylphenidate  5 mg Oral BID WC   midodrine  5 mg Oral BID WC    montelukast  10 mg Oral QHS   multivitamin with minerals  1 tablet Oral Daily   mouth rinse  15 mL Mouth Rinse 4 times per day   pantoprazole  40 mg Oral BID   rosuvastatin  20 mg Oral Daily   senna-docusate  1 tablet Oral BID   Continuous Infusions:   LOS: 65 days    Time spent: over 30 min    [name], MD [facility]   To contact the attending provider between 7A-7P or the covering provider during after hours 7P-7A, please log into the web site [email or web address] and access using universal [place] password for that web site. If you do not have the password, please call the hospital operator.  05/19/2022, 5:01 PM

## 2022-05-19 NOTE — Progress Notes (Signed)
Patient ID: Tara [name], female   DOB: [birth year], 76 y.o.   MRN: [id number] BP 132/73   Pulse 93   Temp 98.6 F (37 C) (Axillary)   Resp (!) 21   Ht '4\' 9"'$  (1.448 m)   Wt 74.7 kg   SpO2 97%   BMI 35.64 kg/m  Alert, responds quickly to voice.  Pt had her standing again today Family does not wish a snf, have spoken with social work Obviously improved

## 2022-05-20 ENCOUNTER — Ambulatory Visit: Payer: Medicare Other | Admitting: Family Medicine

## 2022-05-20 DIAGNOSIS — R569 Unspecified convulsions: Secondary | ICD-10-CM | POA: Diagnosis not present

## 2022-05-20 LAB — COMPREHENSIVE METABOLIC PANEL
ALT: 26 U/L (ref 0–44)
AST: 24 U/L (ref 15–41)
Albumin: 2.7 g/dL — ABNORMAL LOW (ref 3.5–5.0)
Alkaline Phosphatase: 95 U/L (ref 38–126)
Anion gap: 11 (ref 5–15)
BUN: 27 mg/dL — ABNORMAL HIGH (ref 8–23)
CO2: 21 mmol/L — ABNORMAL LOW (ref 22–32)
Calcium: 8.8 mg/dL — ABNORMAL LOW (ref 8.9–10.3)
Chloride: 107 mmol/L (ref 98–111)
Creatinine, Ser: 1.01 mg/dL — ABNORMAL HIGH (ref 0.44–1.00)
GFR, Estimated: 58 mL/min — ABNORMAL LOW (ref >60)
Glucose, Bld: 107 mg/dL — ABNORMAL HIGH (ref 70–99)
Potassium: 3.8 mmol/L (ref 3.5–5.1)
Sodium: 139 mmol/L (ref 135–145)
Total Bilirubin: 0.4 mg/dL (ref 0.3–1.2)
Total Protein: 6.4 g/dL — ABNORMAL LOW (ref 6.5–8.1)

## 2022-05-20 LAB — CBC WITH DIFFERENTIAL/PLATELET
Abs Immature Granulocytes: 0.08 10*3/uL — ABNORMAL HIGH (ref 0.00–0.07)
Basophils Absolute: 0.1 10*3/uL (ref 0.0–0.1)
Basophils Relative: 0 %
Eosinophils Absolute: 0.4 10*3/uL (ref 0.0–0.5)
Eosinophils Relative: 3 %
HCT: 27.5 % — ABNORMAL LOW (ref 36.0–46.0)
Hemoglobin: 8.9 g/dL — ABNORMAL LOW (ref 12.0–15.0)
Immature Granulocytes: 1 %
Lymphocytes Relative: 10 %
Lymphs Abs: 1.7 10*3/uL (ref 0.7–4.0)
MCH: 27.8 pg (ref 26.0–34.0)
MCHC: 32.4 g/dL (ref 30.0–36.0)
MCV: 85.9 fL (ref 80.0–100.0)
Monocytes Absolute: 1.3 10*3/uL — ABNORMAL HIGH (ref 0.1–1.0)
Monocytes Relative: 8 %
Neutro Abs: 12.5 10*3/uL — ABNORMAL HIGH (ref 1.7–7.7)
Neutrophils Relative %: 78 %
Platelets: 251 10*3/uL (ref 150–400)
RBC: 3.2 MIL/uL — ABNORMAL LOW (ref 3.87–5.11)
RDW: 17.2 % — ABNORMAL HIGH (ref 11.5–15.5)
WBC: 16 10*3/uL — ABNORMAL HIGH (ref 4.0–10.5)
nRBC: 0 % (ref 0.0–0.2)

## 2022-05-20 LAB — PHOSPHORUS: Phosphorus: 5 mg/dL — ABNORMAL HIGH (ref 2.5–4.6)

## 2022-05-20 LAB — MAGNESIUM: Magnesium: 1.8 mg/dL (ref 1.7–2.4)

## 2022-05-20 NOTE — TOC Progression Note (Signed)
Transition of Care [facility]) - Progression Note    Patient Details  Name: Tara [name] MRN: [id number] Date of Birth: [birth year]  Transition of Care [facility] CM/SW Martorell, RN Phone Number:[phone]  05/20/2022, 12:11 PM  Clinical Narrative:    TOC continues to follow to assist with discharge needs. Cm spoke with daughter Ricki Rodriguez who states that she does not want her mother to go to SNF and she understands that her mother does not meet criteria for CIR although everyone had hoped that she could go. Daughter states that she will bring her mother home with her at discharge. Daughter states that patient has used [facility] before and wishes to continue with [facility] at discharge. Daughter requesting to maximize home health services [facility] PT/OT/ RN/ [facility] aide). CM has made daughter aware that this can be arranged. MD will need to enter orders. CM has also advised daughter that these services will be limited to short visits 2-3 times per week and that home health agency would not be in the home for extended periods of time. Daughter verbalizes understanding and acknowledges that they may need to hire sitters to assist with care. CM and daughter discussed DME needs daughter is requesting the following DME- Hospital bed, wheelchair, BSC, lift to stand chair, hoyer lift, pure wick, suction setup,nebulizer BP cuff and pulse oximetry. CM made daughter aware that DME can be ordered as soon as MD enters order. Some items will not be covered by insurance but CM is willing to ask. Daughter verbalizes understanding but would like to get what insurance will cover and will purchase other needed items that insurance does not cover. TOC continues to follow for discharge planning when patient is medically stable for discharge.      Expected Discharge Plan: Skilled Nursing Facility Barriers to Discharge: Other (must enter comment) (family declined SNF & clearance from attending)  Expected  Discharge Plan and Services Expected Discharge Plan: [place]   Discharge Planning Services: CM Consult   Living arrangements for the past 2 months: [place]: [place]         Social Determinants of Health (SDOH) Interventions    Readmission Risk Interventions    03/06/2022   12:36 PM  Readmission Risk Prevention Plan  Transportation Screening Complete  HRI or Home Care Consult Complete  Social Work Consult for [place] Planning/Counseling Complete

## 2022-05-20 NOTE — Progress Notes (Signed)
Patient ID: Tara [name], female   DOB: 03/26/1946, 76 y.o.   MRN: [id number] BP 128/81 (BP Location: Left Leg)   Pulse 93   Temp 98.1 F (36.7 C) (Oral)   Resp 20   Ht '4\' 9"'$  (1.448 m)   Wt 74.7 kg   SpO2 93%   BMI 35.64 kg/m  Alert, following some commands Moving all extremtiies Not clear why [facility] was not contacted about nausea and emesis. At this time no xray needed.  Working with PT and OT

## 2022-05-20 NOTE — Progress Notes (Signed)
PT Splint check note:  Sister in the room Estill Bamberg) and reports wearing brace 2 hours on and 2 hours off, states not sure about PRAFO wear time, noted no pressure spots after wearing ankld contracture boot 2 hours, removed and discussed resting from PRAFO splints since will wear all night.  Discussed if not wearing splints and up in chair need to bridge heels with pillow to prevent skin breakdown on heels.  Patient tolerating well.  Will follow up.  Magda Kiel, PT Acute Rehabilitation Services Office:[phone] 05/20/2022 [id number] 1 Orthotic Check

## 2022-05-20 NOTE — TOC Progression Note (Signed)
Transition of Care [facility]) - Progression Note    Patient Details  Name: Tara [name] MRN: [id number] Date of Birth: [birth year]  Transition of Care [facility]) CM/SW [place], [place] Phone Number: 05/20/2022, 9:57 AM  Clinical Narrative:     CSW spoke with patient's daughter- she confirmed, family does not want SNF- family preference is CIR or Home.   TOC will continue to follow and assist with discharge planning.  Thurmond Butts, MSW, LCSW Clinical Social Worker    Expected Discharge Plan: Skilled Nursing Facility Barriers to Discharge: Other (must enter comment) (family declined SNF & clearance from attending)  Expected Discharge Plan and Services Expected Discharge Plan: [place]   Discharge Planning Services: CM Consult   Living arrangements for the past 2 months: [place]: [place]         Social Determinants of Health (SDOH) Interventions    Readmission Risk Interventions    03/06/2022   12:36 PM  Readmission Risk Prevention Plan  Transportation Screening Complete  HRI or Home Care Consult Complete  Social Work Consult for [place] Planning/Counseling Complete

## 2022-05-20 NOTE — Progress Notes (Signed)
PROGRESS NOTE    Tara [name]  VHQ:[id number] DOB: 1945-12-09 DOA: 03/15/2022 PCP: [name], MD  Chief Complaint  Patient presents with   Seizures    Brief Narrative:  76 year old African-American female PMH of anxiety, depression, type II DM, HLD, PAF, on Eliquis, CKD 3B, breast cancer SP chemoradiation presented to hospital on 6/24 with new onset seizures. Known history of sphenoidal meningioma that was noted to be increased in size on the CT scan with surrounding edema so she is transferred from [facility] for further evaluation.  She underwent LTM with no further seizures and was evaluated by neurosurgery and underwent bifrontal craniotomy for meningioma resection on 03/24/2020 and was transferred to the intensive care unit postoperatively with PCCM consulting while she was in ICU. 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  Treated with Unasyn for 5 days. 7/11 recurrent sz like activity. Not correlated on EEG. CT unchanged.  7/13 neurology signed off. 7/19 started on ABX for sepsis then transition to Unasyn. 7/21 palliative care was consulted. 7/23 CT chest and abdomen was performed negative for PE or significant pneumonia, without any hemorrhage 7/25 MRI brain without contrast shows increasing mass effect, SDH as well as subacute/acute small scattered infarct. 7/26 tube feeds resumed, neurology reconsulted.  Eliquis resumed per neurosurgery 7/27 oxygenation improving, mentation improving, neurology following.  CTA head and neck without any LVO.  Lower extremity Doppler without any DVT.  Echo shows new systolic dysfunction with global hypokinesis EF 40 to 45% and significant RV failure. 7/30 had another aspiration event.  Tube feeds on hold. Now on NRB. Remains full code. No tracheostomy per husband. Per Neurosurgery, give at least two weeks if not more to see if she improves.  8/1 oxygenation improving again to 5 L.  Trickle tube feeds started. 8/9 on D1 honey thick liquid diet.  Calorie count initiated. 8/13 oxygenation improving to 2 L. 8/14 core track removed nocturnal tube feeds stopped. 8/15 leukocytosis worsening, abdomen distended, x-ray abdomen shows evidence of possible sigmoid volvulus, GI consulted, CT abdomen negative for any volvulus and only shows redundant sigmoid colon.  Will resume diet. 8/24 keppra dose decreased 8/26 ritalin started 8/16-present: leukocytosis of unknown etiology persists     Assessment & Plan:   Principal Problem:   Seizure ([place]) Active Problems:   Meningioma, cerebral (HCC)   Leukocytosis   Lethargy   Acute on chronic combined systolic and diastolic CHF (congestive heart failure) (HCC)   Acute respiratory failure with hypoxia (HCC)   Hypertension   Anxiety and depression   GERD   Hyperlipidemia   Hypokalemia   AKI (acute kidney injury) ([place])   Atrial fibrillation, chronic (HCC)   Protein-calorie malnutrition, moderate (HCC)   Meningioma (HCC)   Pressure injury of skin   Aspiration into airway   Palliative care encounter   Dysfunction of right cardiac ventricle   Acute postoperative anemia due to expected blood loss   Dysphagia   Assessment and Plan: * Seizure ([place]) Continue Keppra EEG 8/24 with moderate diffuse encephalopathy, no seizures or epileptiform discharges PM&R (8/24 note) recommending considering decreasing keppra? I discussed with neurology on call who were ok decreasing to 750 mg BID (8/24).  Will continue to monitor.  Meningioma, cerebral [facility]) Per neurosurgery  Lethargy PM&R  recommending considering ritalin 5 mg twice daily  I discussed informally with cards, in absence of RVR and decompensated HF can consider this - started 8/26,  will follow response  Leukocytosis Recurrent and persistent. Know specific infectious source identified although patient has been recently treated for recurrent aspiration pneumonia. Recently completed course of unasyn 8/3 CXR 8/19 concerning for edema vs pneumia repeat CXR without acute abnormality, low lung volumes  Acute respiratory failure with hypoxia (HCC) Secondary to pneumonia with associated tachypnea. Hypoxia improved but not resolved. Repeat chest x-ray with edema vs pneumonia. Patient with leukocytosis and no other infectious symptoms. Down to 1 L/min with Lasix. -Wean as able -repeat CXR with low lung volumes, no acute abnormality  Acute on chronic combined systolic and diastolic CHF (congestive heart failure) (HCC) Most recent Transthoracic Echocardiogram with LVEF of 40-45% (down from 55%) with global hypokinesis and associated RV dysfunction noted. Patient with acute heart failure this admission that responded to Lasix. Currently resolved. Not on GDMT secondary to recent hypotension. -Continue Lasix  Hypertension - Continue lasix, currently on midodrine  Anxiety and depression Previously on Zoloft and Klonopin which are discontinued.  GERD -Continue Protonix  Atrial fibrillation, chronic (HCC) Patient is on rate/rhythm control and on anticoagulation. -Continue amiodarone and Eliquis  AKI (acute kidney injury) ([place]) Possibly secondary to lasix although now has stabilized. Weight appears to have downtrended from admission but is stable for the past week.  Hypokalemia Replace and follow  Hyperlipidemia -Continue Crestor  Dysphagia Previously with Jarquis Walker Cortrak and tube feedings. Evaluated/followed by speech therapy. -Speech therapy recommendations (8/28)  - regular solids, whole meds with puree (see note)  Acute postoperative anemia due to expected blood loss Acute anemia down to 6.5 requiring 2 units of PRBC. Hemoglobin currently stable.  Dysfunction of right  cardiac ventricle Acute/chronic PE ruled out. Possibly related to pneumonia. Recommendation for repeat Transthoracic Echocardiogram in 6 months. Patient can follow-up with pulmonology.  Hypernatremia-resolved as of 05/07/2022 Resolved.  Thrombocytopenia (HCC)-resolved as of 05/07/2022 Resolved.      DVT prophylaxis: eliquis Code Status: full Family Communication: family at bedside Disposition:   Status is: Inpatient Remains inpatient appropriate because: per primary   Consultants:  Neurosurgery Neurology Pccm Palliative care GI  Procedures:  See significant events above  Antimicrobials:  Anti-infectives (From admission, onward)    Start     Dose/Rate Route Frequency Ordered Stop   04/21/22 0000  Ampicillin-Sulbactam (UNASYN) 3 g in sodium chloride 0.9 % 100 mL IVPB        3 g 200 mL/hr over 30 Minutes Intravenous Every 8 hours 04/20/22 1913 04/25/22 0018   04/12/22 1500  Ampicillin-Sulbactam (UNASYN) 3 g in sodium chloride 0.9 % 100 mL IVPB  Status:  Discontinued        3 g 200 mL/hr over 30 Minutes Intravenous Every 8 hours 04/12/22 1409 04/20/22 1913   04/11/22 1845  Ampicillin-Sulbactam (UNASYN) 3 g in sodium chloride 0.9 % 100 mL IVPB  Status:  Discontinued        3 g 200 mL/hr over 30 Minutes Intravenous Every 8 hours 04/11/22 1754 04/12/22 1409   04/10/22 1200  vancomycin (VANCOREADY) IVPB 750 mg/150 mL  Status:  Discontinued        750 mg 150 mL/hr over 60 Minutes Intravenous Every 24 hours 04/09/22 1034 04/11/22 1100   04/10/22 0900  metroNIDAZOLE (FLAGYL) IVPB 500 mg  Status:  Discontinued        500 mg 100 mL/hr over 60 Minutes Intravenous  Every 12 hours 04/10/22 0754 04/11/22 1754   04/09/22 2300  ceFEPIme (MAXIPIME) 2 g in sodium chloride 0.9 % 100 mL IVPB  Status:  Discontinued        2 g 200 mL/hr over 30 Minutes Intravenous Every 12 hours 04/09/22 1035 04/11/22 1717   04/09/22 1045  vancomycin (VANCOREADY) IVPB 1750 mg/350 mL        1,750 mg 175  mL/hr over 120 Minutes Intravenous  Once 04/09/22 0953 04/09/22 1344   04/09/22 1045  ceFEPIme (MAXIPIME) 2 g in sodium chloride 0.9 % 100 mL IVPB        2 g 200 mL/hr over 30 Minutes Intravenous  Once 04/09/22 0953 04/09/22 1524   03/27/22 0945  Ampicillin-Sulbactam (UNASYN) 3 g in sodium chloride 0.9 % 100 mL IVPB        3 g 200 mL/hr over 30 Minutes Intravenous Every 6 hours 03/27/22 0859 04/01/22 0518   03/25/22 0600  ceFAZolin (ANCEF) IVPB 2g/100 mL premix        2 g 200 mL/hr over 30 Minutes Intravenous On call to O.R. 03/24/22 1215 03/24/22 1440       Subjective: Denies pain  Objective: Vitals:   05/20/22 0300 05/20/22 0705 05/20/22 1126 05/20/22 1517  BP: (!) 110/49 118/60 (!) 115/59 128/81  Pulse: 99 94 71 93  Resp: '20 19  20  '$ Temp: 97.9 F (36.6 C) 98.4 F (36.9 C) (!) 97.2 F (36.2 C) 98.1 F (36.7 C)  TempSrc: Oral Oral Axillary Oral  SpO2: 96% 93% 95% 93%  Weight:      Height:        Intake/Output Summary (Last 24 hours) at 05/20/2022 1744 Last data filed at 05/20/2022 0950 Gross per 24 hour  Intake --  Output 700 ml  Net -700 ml   Filed Weights   05/03/22 0345 05/04/22 0413 05/06/22 0500  Weight: 73.6 kg 74.7 kg 74.7 kg    Examination:  General: No acute distress. Cardiovascular: RRR Lungs: unlabored Abdomen: Soft, nontender, nondistended  Neurological: more alert, still slow to respond, still with echolalia - moving all extremities Extremities: No clubbing or cyanosis. No edema. Data Reviewed: I have personally reviewed following labs and imaging studies  CBC: Recent Labs  Lab 05/15/22 0555 05/16/22 0243 05/17/22 0227 05/18/22 0530 05/19/22 0302 05/20/22 0136  WBC 17.7* 18.5* 17.3* 16.5* 16.5* 16.0*  NEUTROABS 14.0* 14.7* 13.5*  --  12.8* 12.5*  HGB 9.2* 8.9* 8.6* 9.2* 8.9* 8.9*  HCT 28.6* 27.3* 26.4* 29.4* 27.3* 27.5*  MCV 85.1 86.4 84.9 88.0 85.6 85.9  PLT 257 242 222 252 261 749    Basic Metabolic Panel: Recent Labs  Lab  05/15/22 0555 05/16/22 0243 05/17/22 0227 05/18/22 0530 05/19/22 0302 05/20/22 0136  NA 140 139 140 144 143 139  K 3.8 3.5 3.1* 4.0 3.7 3.8  CL 107 107 109 112* 110 107  CO2 24 22 21* 20* 23 21*  GLUCOSE 112* 116* 120* 108* 105* 107*  BUN 22 27* 26* 27* 25* 27*  CREATININE 1.03* 1.10* 0.97 0.97 1.15* 1.01*  CALCIUM 8.8* 8.7* 8.6* 8.9 8.7* 8.8*  MG 1.8 1.8 1.7  --  1.7 1.8  PHOS 4.6 4.7* 4.1  --  4.7* 5.0*    GFR: Estimated Creatinine Clearance: 39.6 mL/min (Chiquitta Matty) (by C-G formula based on SCr of 1.01 mg/dL (H)).  Liver Function Tests: Recent Labs  Lab 05/15/22 0555 05/16/22 0243 05/17/22 0227 05/19/22 0302 05/20/22 0136  AST '27 23 21 '$ 25  24  ALT 34 '25 27 28 26  '$ ALKPHOS 92 96 98 93 95  BILITOT 0.4 0.5 0.4 0.5 0.4  PROT 6.6 6.2* 6.4* 6.5 6.4*  ALBUMIN 2.7* 2.6* 2.7* 2.7* 2.7*    CBG: No results for input(s): "GLUCAP" in the last 168 hours.   No results found for this or any previous visit (from the past 240 hour(s)).       Radiology Studies: DG Swallowing Func-Speech Pathology  Result Date: 05/19/2022 Table formatting from the original result was not included. Objective Swallowing Evaluation: Type of Study: MBS-Modified Barium Swallow Study  Patient Details Name: Tara [name] MRN: [id number] Date of Birth: 12-22-1945 Today's Date: 05/19/2022 Time: SLP Start Time (ACUTE ONLY): 9518 -SLP Stop Time (ACUTE ONLY): 8416 SLP Time Calculation (min) (ACUTE ONLY): 16 min Past Medical History: Past Medical History: Diagnosis Date  Abnormal mammogram of right breast 07/29/2017  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  Anxiety   Breast cancer ([place]) 2008  right - s/p lumpectomy->chemo, radiation  Depression   Dysrhythmia   hx SVT  Family history of colon cancer   Family history of prostate cancer   GERD (gastroesophageal reflux disease)   H/O: hysterectomy   Hiatal hernia   History of cancer chemotherapy   History of radiation therapy   Hyperlipidemia   Hypertension 01/25/2018  Nonischemic  cardiomyopathy ([place])   Personal history of radiation therapy 01/05/209  SVT (supraventricular tachycardia) ([place])   short RP SVT documented 6/06  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[place])   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)  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 Past Surgical History: Past Surgical History: Procedure Laterality Date  ABDOMINAL HYSTERECTOMY  1994  fibroids,   BIOPSY  06/19/2016  Procedure: BIOPSY;  Surgeon: Daneil Dolin, MD;  [facility];  Service: Endoscopy;;  gastric duodenum  BREAST BIOPSY Right 12/16/2006  malignant  BREAST EXCISIONAL BIOPSY Right 2018  benign lumpectomy  BREAST LUMPECTOMY Right 02/2007  BREAST LUMPECTOMY WITH RADIOACTIVE SEED LOCALIZATION Right 07/29/2017  Procedure: RIGHT BREAST LUMPECTOMY WITH RADIOACTIVE SEED LOCALIZATION;  Surgeon: Fanny Skates, MD;  Location: [place];  Service: General;  Laterality: Right;  BREAST SURGERY Right 2008  lumpectomy, cancer  CARDIAC CATHETERIZATION    CHOLECYSTECTOMY  1999  COLONOSCOPY  2008  Dr. Oneida Alar: multiple polyps. Path not available at time of visit.   COLONOSCOPY WITH PROPOFOL N/Jahsiah Carpenter 04/25/2014  Dr. Oneida Alar: Multiple tubular adenomas removed. Diverticulosis. Moderate internal hemorrhoids. Next colonoscopy planned for August 2018.  CRANIOTOMY Bilateral 03/24/2022  Procedure: Bifrontal craniotomy for resection of meningioma;  Surgeon: Ashok Pall, MD;  Location: [place];  Service: Neurosurgery;  Laterality: Bilateral;  ESOPHAGOGASTRODUODENOSCOPY (EGD) WITH PROPOFOL N/Luca Dyar 06/19/2016  Procedure: ESOPHAGOGASTRODUODENOSCOPY (EGD) WITH PROPOFOL;  Surgeon: Daneil Dolin, MD;  [facility];  Service: Endoscopy;  Laterality: N/Morrison Mcbryar;  MASTECTOMY W/ SENTINEL NODE BIOPSY Right 06/09/2018  Procedure: RIGHT TOTAL MASTECTOMY WITH SENTINEL LYMPH NODE BIOPSY;  Surgeon: Fanny Skates, MD;  Location: [place];  Service: General;  Laterality: Right;  POLYPECTOMY N/Serenah Mill 04/25/2014  Procedure: POLYPECTOMY;   Surgeon: Danie Binder, MD;  Location: [facility];  Service: Endoscopy;  Laterality: N/Wakeelah Solan;  Ascending and Decending Colon x3 , Transverse colon x2, rectal  PORTACATH PLACEMENT N/Jett Kulzer 06/09/2018  Procedure: INSERTION PORT-Imer Foxworth-CATH;  Surgeon: Fanny Skates, MD;  Location: [place];  Service: General;  Laterality: N/Tarron Krolak;  RIGHT/LEFT HEART CATH AND CORONARY ANGIOGRAPHY N/Lemoine Goyne 07/10/2017  Procedure: RIGHT/LEFT HEART CATH  AND CORONARY ANGIOGRAPHY;  Surgeon: Larey Dresser, MD;  Location: [place] [facility];  Service: Cardiovascular;  Laterality: N/Monick Rena; HPI: Pt is Katyra Tomassetti 76 y/o who presented 6/24 with new onset seizures. 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 EEG negative for seizures. Neurosurgey consulted and did not recommend meningioma resection as of 6/27. Decline in swallow function noted overnight 6/27. Dx acute toxic metabolic encephalopathy suspected secondary to keppra. Pt s/p Bifrontal craniotomy for resection of meningioma 7/3.  Cortrak 7/7. 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  CXR 8/6 concerning for patchy airspace disease. PMH: HTN, HLD, GERD, CKD stage IIIb, chronic HFpEF, meningioma, breast CA-s/p right lumpectomy and chemoradiation in 2008, depression/anxiety. BSE 03/11/22: regular texture diet and thin liquids without need for follow up. MBS 8/8 recommended Dys 1, honey thick.  Subjective: daughter present; pt awake, alert, pleasant, participative  Recommendations for follow up therapy are one component of Natoshia Souter multi-disciplinary discharge planning process, led by the attending physician.  Recommendations may be updated based on patient status, additional functional criteria and insurance authorization. Assessment / Plan / Recommendation   05/19/2022   1:18 PM Clinical Impressions Clinical Impression Pt's swallow function has improved from prior MBS marked by significantly decreased volume of pharyngeal residue and absence of aspiration. Mastication and propulsion was mildly  delayed with solid texture. Pt's swallow initiation was delayed 3-4 seconds to valleculae and once to pyriform sinuses. Multiple sips thin liquid via straw resulted in penetration to vocal cords (PAS 5) without immediate awareness but with delayed throat clear due to decreased timing of laryngeal closure. Mckenzee Beem second spontaneous swallow decreased amount of penetrate. There was reduced tongue base retraction with moderate vallecular residue and mild with other consistencies (valleculae and pyriform sinuses). Recommend upgrade to regular texture and to nectar thick liquids (from honey), straws allowed, crush meds and ask pt to clear her throat intermittently. ST will work with pt re: trials thin liquid with use of small sips via cup or straw. SLP Visit Diagnosis Dysphagia, oropharyngeal phase (R13.12) Impact on safety and function Mild aspiration risk     05/19/2022   1:18 PM Treatment Recommendations Treatment Recommendations Therapy as outlined in treatment plan below     05/19/2022   1:18 PM Prognosis Prognosis for Safe Diet Advancement Good Barriers to Reach Goals Cognitive deficits   05/19/2022   1:18 PM Diet Recommendations SLP Diet Recommendations Regular solids Medication Administration Whole meds with puree Compensations Slow rate;Small sips/bites;Clear throat intermittently Postural Changes Seated upright at 90 degrees     05/19/2022   1:18 PM Other Recommendations Oral Care Recommendations Oral care BID Follow Up Recommendations Skilled nursing-short term rehab (<3 hours/day) Assistance recommended at discharge Frequent or constant Supervision/Assistance Functional Status Assessment Patient has had Ranson Belluomini recent decline in their functional status and demonstrates the ability to make significant improvements in function in Anarie Kalish reasonable and predictable amount of time.   05/19/2022   1:18 PM Frequency and Duration  Speech Therapy Frequency (ACUTE ONLY) min 2x/week Treatment Duration 2 weeks     05/19/2022   1:18 PM Oral  Phase Oral Phase Impaired Oral - Honey Cup NT Oral - Nectar Teaspoon WFL Oral - Nectar Cup WFL Oral - Nectar Straw WFL Oral - Thin Teaspoon WFL Oral - Thin Cup WFL Oral - Thin Straw WFL Oral - Puree WFL Oral - Mech Soft NT Oral - Regular Delayed oral transit    05/19/2022   1:18 PM Pharyngeal Phase Pharyngeal Phase Impaired Pharyngeal- Honey Cup  NT Pharyngeal- Nectar Teaspoon Delayed swallow initiation-vallecula Pharyngeal- Nectar Cup Delayed swallow initiation-vallecula Pharyngeal Material does not enter airway Pharyngeal- Nectar Straw Delayed swallow initiation-pyriform sinuses Pharyngeal Material does not enter airway Pharyngeal- Thin Teaspoon Delayed swallow initiation-pyriform sinuses Pharyngeal- Thin Cup Penetration/Aspiration during swallow;Pharyngeal residue - pyriform;Pharyngeal residue - valleculae Pharyngeal Material enters airway, remains ABOVE vocal cords and not ejected out Pharyngeal- Thin Straw Pharyngeal residue - valleculae;Pharyngeal residue - pyriform;Penetration/Aspiration during swallow Pharyngeal Material enters airway, CONTACTS cords and not ejected out Pharyngeal- Puree NT Pharyngeal- Mechanical Soft NT Pharyngeal- Regular Pharyngeal residue - valleculae    05/19/2022   1:18 PM Cervical Esophageal Phase  Cervical Esophageal Phase -- Houston Siren 05/19/2022, 2:34 PM                          Scheduled Meds:  amiodarone  200 mg Oral Daily   apixaban  5 mg Oral BID   budesonide (PULMICORT) nebulizer solution  0.25 mg Nebulization BID   ciprofloxacin  2 drop Right Eye Q4H while awake   furosemide  40 mg Oral QODAY   lacosamide  200 mg Oral BID   levETIRAcetam  750 mg Oral BID   methylphenidate  5 mg Oral BID WC   midodrine  5 mg Oral BID WC   montelukast  10 mg Oral QHS   multivitamin with minerals  1 tablet Oral Daily   mouth rinse  15 mL Mouth Rinse 4 times per day   pantoprazole  40 mg Oral BID   rosuvastatin  20 mg Oral Daily   senna-docusate  1 tablet Oral BID    Continuous Infusions:   LOS: 66 days    Time spent: over 30 min    [name], MD [facility]   To contact the attending provider between 7A-7P or the covering provider during after hours 7P-7A, please log into the web site [email or web address] and access using universal [place] password for that web site. If you do not have the password, please call the hospital operator.  05/20/2022, 5:44 PM

## 2022-05-20 NOTE — Progress Notes (Signed)
Pt had episode of N/V.  Family concerned she may need xray.  Called Dr. Lacy Duverney office and left message with his assistant to advise.

## 2022-05-21 ENCOUNTER — Inpatient Hospital Stay (HOSPITAL_COMMUNITY): Payer: Medicare Other

## 2022-05-21 DIAGNOSIS — R569 Unspecified convulsions: Secondary | ICD-10-CM | POA: Diagnosis not present

## 2022-05-21 LAB — CBC WITH DIFFERENTIAL/PLATELET
Abs Immature Granulocytes: 0.05 10*3/uL (ref 0.00–0.07)
Basophils Absolute: 0.1 10*3/uL (ref 0.0–0.1)
Basophils Relative: 1 %
Eosinophils Absolute: 0.3 10*3/uL (ref 0.0–0.5)
Eosinophils Relative: 3 %
HCT: 29.4 % — ABNORMAL LOW (ref 36.0–46.0)
Hemoglobin: 9.3 g/dL — ABNORMAL LOW (ref 12.0–15.0)
Immature Granulocytes: 0 %
Lymphocytes Relative: 11 %
Lymphs Abs: 1.4 10*3/uL (ref 0.7–4.0)
MCH: 27.6 pg (ref 26.0–34.0)
MCHC: 31.6 g/dL (ref 30.0–36.0)
MCV: 87.2 fL (ref 80.0–100.0)
Monocytes Absolute: 0.9 10*3/uL (ref 0.1–1.0)
Monocytes Relative: 7 %
Neutro Abs: 9.8 10*3/uL — ABNORMAL HIGH (ref 1.7–7.7)
Neutrophils Relative %: 78 %
Platelets: 268 10*3/uL (ref 150–400)
RBC: 3.37 MIL/uL — ABNORMAL LOW (ref 3.87–5.11)
RDW: 17.2 % — ABNORMAL HIGH (ref 11.5–15.5)
WBC: 12.6 10*3/uL — ABNORMAL HIGH (ref 4.0–10.5)
nRBC: 0 % (ref 0.0–0.2)

## 2022-05-21 LAB — COMPREHENSIVE METABOLIC PANEL
ALT: 26 U/L (ref 0–44)
AST: 22 U/L (ref 15–41)
Albumin: 2.9 g/dL — ABNORMAL LOW (ref 3.5–5.0)
Alkaline Phosphatase: 96 U/L (ref 38–126)
Anion gap: 9 (ref 5–15)
BUN: 24 mg/dL — ABNORMAL HIGH (ref 8–23)
CO2: 25 mmol/L (ref 22–32)
Calcium: 9 mg/dL (ref 8.9–10.3)
Chloride: 109 mmol/L (ref 98–111)
Creatinine, Ser: 1.13 mg/dL — ABNORMAL HIGH (ref 0.44–1.00)
GFR, Estimated: 50 mL/min — ABNORMAL LOW (ref >60)
Glucose, Bld: 100 mg/dL — ABNORMAL HIGH (ref 70–99)
Potassium: 3.8 mmol/L (ref 3.5–5.1)
Sodium: 143 mmol/L (ref 135–145)
Total Bilirubin: 0.3 mg/dL (ref 0.3–1.2)
Total Protein: 6.8 g/dL (ref 6.5–8.1)

## 2022-05-21 LAB — MAGNESIUM: Magnesium: 1.9 mg/dL (ref 1.7–2.4)

## 2022-05-21 LAB — PHOSPHORUS: Phosphorus: 4.8 mg/dL — ABNORMAL HIGH (ref 2.5–4.6)

## 2022-05-21 MED ORDER — FUROSEMIDE 20 MG PO TABS
20.0000 mg | ORAL_TABLET | ORAL | Status: DC
  Administered 2022-05-22 – 2022-06-01 (×6): 20 mg via ORAL
  Filled 2022-05-21 (×10): qty 1

## 2022-05-21 NOTE — Progress Notes (Signed)
Occupational Therapy Treatment Patient Details Name: Tara [name] MRN: [id number] DOB: [birth year] Today's Date: 05/21/2022   History of present illness 76 yo woman admitted 03/15/22 with new onset seizures related to meningioma. s/p tumor resection 7/3. Recent DC from [facility] 6/22. MRI on 7/26 shows: SDH extending inferiorly along the clivus and  narrowing the foramen magnum with possible mass effect. PMH: CKD stage 3, HFpEF, a fib, meningioma.   OT comments  On entry, caregiver feeding Tara [name]. With multimodal cues, Tara [name] able to feed herself toast using her R hand with cues for initiation and coordination of movement after toast placed in hand. Also assisted with bringing lidded cup to mouth and using straw to drink. Straw removed after a couple of sips due to difficulty terminating task. Able to progress out of bed to chair with use of Stedy with Max A +2. Once in standing, pt maintained standing position as she had difficulty switching attention and motor planning I order to sit down. Less verbal today with periods of staring off into superior quadrant (L bias). Caregiver present throughout session for education. Will continue to follow.    Recommendations for follow up therapy are one component of a multi-disciplinary discharge planning process, led by the attending physician.  Recommendations may be updated based on patient status, additional functional criteria and insurance authorization.    Follow Up Recommendations  Skilled nursing-short term rehab (<3 hours/day) ; if DC plan is home, will need max HH services - see CM note   Assistance Recommended at Discharge Frequent or constant Supervision/Assistance  Patient can return home with the following  Two people to help with walking and/or transfers;Two people to help with bathing/dressing/bathroom;Assistance with cooking/housework;Assistance with feeding;Direct supervision/assist for medications management;Direct supervision/assist for  financial management;Assist for transportation;Help with stairs or ramp for entrance   Equipment Recommendations  BSC/3in1;Wheelchair (measurements OT);Wheelchair cushion (measurements OT);Hospital bed;Other (comment) Harrel Lemon)    Recommendations for Other Services Other (comment)    Precautions / Restrictions Precautions Precautions: Fall Precaution Comments: seizures Required Braces or Orthoses: Other Brace Other Brace: bilat resting hand splints,  ankle contracture boot, B Prafos Restrictions Weight Bearing Restrictions: No       Mobility Bed Mobility Overal bed mobility: Needs Assistance Bed Mobility: Supine to Sit   Sidelying to sit: Total assist       General bed mobility comments: Decreased initation of movement    Transfers Overall transfer level: Needs assistance                 General transfer comment: Max A +2 to stand after placing B hands on rails; Once standing, pt stood and did not sit on paddles; tactile cues to help facilitate sit due to perseveration Transfer via Lift Equipment: Stedy   Balance     Sitting balance-Tara [name] Scale: Poor       Standing balance-Tara [name] Scale: Zero                             ADL either performed or assessed with clinical judgement   ADL   Eating/Feeding: Maximal assistance Eating/Feeding Details (indicate cue type and reason): on regualr diet; does better with finger foods, able to bring toast to mouth, however required tactile cues for initiation; Held toast tightly in hand, would not release spontaneously; used lidded cup to drink with straw - straw removed after 2 sips, as pt would continue to "gulp" Grooming: Maximal assistance Grooming Details (indicate cue type and  reason): wiping mouth                             Functional mobility during ADLs: Maximal assistance;+2 for physical assistance      Extremity/Trunk Assessment Upper Extremity Assessment Upper Extremity Assessment: RUE  deficits/detail;LUE deficits/detail RUE Deficits / Details: Able to bring toast to her outh once placed into her hand, however would not open her hadn spontaneously. Cues for initiation and termnation of movemetn patterns LUE Deficits / Details: increased rigidity LUE today, however PROM overall WFL; discussed use of splints with caregiver - can use for 2 hr intervals during the day if rigidity noted, otherwise, encourage functional use of hand   Lower Extremity Assessment Lower Extremity Assessment: Defer to PT evaluation        Vision   Vision Assessment?: Vision impaired- to be further tested in functional context Additional Comments: Would track therapists, however times of staring to L bias superior quadrant   Perception Perception Perception: Impaired   Praxis Praxis Praxis: Impaired Praxis Impairment Details: Initiation;Motor planning;Perseveration    Cognition Arousal/Alertness: Awake/alert Behavior During Therapy: Flat affect, Anxious Overall Cognitive Status: Impaired/Different from baseline Area of Impairment: Attention, Following commands, Safety/judgement, Awareness, Problem solving                   Current Attention Level: Focused, Sustained   Following Commands: Follows one step commands inconsistently Safety/Judgement: Decreased awareness of safety, Decreased awareness of deficits Awareness: Intellectual Problem Solving: Slow processing, Decreased initiation, Difficulty sequencing, Requires verbal cues, Requires tactile cues General Comments: Less verbal today however sequencing improved at times; staring off superioroly at times, however when her name was called, would look at therapist and engage for a short period before staring off again - alwasy starign to superior quadrant (appeared skewed to the left)        Exercises General Exercises - Upper Extremity Shoulder Flexion: Both, 5 reps, Seated, PROM Shoulder Extension: PROM, 5 reps, Both, Seated,  AAROM Shoulder ABduction: PROM, Both, Seated, 5 reps, AAROM Shoulder ADduction: PROM, 5 reps, Both, Seated, AAROM Elbow Flexion: PROM, Both, 5 reps, Seated, AROM, AAROM Elbow Extension: PROM, Both, 5 reps, Seated, AROM, AAROM Wrist Flexion: PROM, Both, 5 reps, Seated, AROM, AAROM Wrist Extension: PROM, Both, 5 reps, Seated, AROM, AAROM Digit Composite Flexion: PROM, Both, 5 reps, Seated, AROM, AAROM Composite Extension: PROM, Both, 5 reps, Seated, AROM, AAROM    Shoulder Instructions       General Comments      Pertinent Vitals/ Pain       Pain Assessment Pain Assessment: Faces Faces Pain Scale: Hurts a little bit Pain Location: general grimacing with movement Pain Descriptors / Indicators: Grimacing Pain Intervention(s): Limited activity within patient's tolerance  Home Living                                          Prior Functioning/Environment              Frequency  Min 2X/week        Progress Toward Goals  OT Goals(current goals can now be found in the care plan section)  Progress towards OT goals: Progressing toward goals  Acute Rehab OT Goals Patient Stated Goal: per family for [place] to get better OT Goal Formulation: With family Time For Goal Achievement: 05/30/22 Potential to Achieve Goals:  Fair ADL Goals Pt Will Perform Grooming: with mod assist;sitting Pt Will Perform Upper Body Bathing: with mod assist;sitting Pt Will Perform Lower Body Bathing: with max assist;sit to/from stand Pt Will Perform Lower Body Dressing: with modified independence;sitting/lateral leans;sit to/from stand Pt Will Transfer to Toilet: with max assist;with +2 assist;bedside commode Pt Will Perform Toileting - Clothing Manipulation and hygiene: with modified independence;sitting/lateral leans;sit to/from stand Additional ADL Goal #1: Pt will track to midline to find family members with moderate directional cues and max assist to turn head Additional ADL  Goal #2: Pt will accurately rspond to questions when given choice of 2 answers Additional ADL Goal #3: Ptwill maintain midlien posutral control EOB with min A in preparation for ADL tasks Additional ADL Goal #4: pt will tolerate B resting hand splints at nights to prevent further flexor tightness B hands  Plan Discharge plan remains appropriate;Frequency remains appropriate    Co-evaluation    PT/OT/SLP Co-Evaluation/Treatment: Yes Reason for Co-Treatment: Complexity of the patient's impairments (multi-system involvement);Necessary to address cognition/behavior during functional activity;For patient/therapist safety;To address functional/ADL transfers   OT goals addressed during session: ADL's and self-care;Strengthening/ROM      AM-PAC OT "6 Clicks" Daily Activity     Outcome Measure   Help from another person eating meals?: A Lot Help from another person taking care of personal grooming?: A Lot Help from another person toileting, which includes using toliet, bedpan, or urinal?: Total Help from another person bathing (including washing, rinsing, drying)?: Total Help from another person to put on and taking off regular upper body clothing?: Total Help from another person to put on and taking off regular lower body clothing?: Total 6 Click Score: 8    End of Session Equipment Utilized During Treatment: Oxygen (1L)  OT Visit Diagnosis: Unsteadiness on feet (R26.81);Other abnormalities of gait and mobility (R26.89);Muscle weakness (generalized) (M62.81);Low vision, both eyes (H54.2);Other symptoms and signs involving cognitive function;Pain Pain - Right/Left:  (generalized) Pain - part of body:  (generalized)   Activity Tolerance Patient tolerated treatment well   Patient Left in chair;with call bell/phone within reach;with bed alarm set;with family/visitor present   Nurse Communication Mobility status;Need for lift equipment        Time: 0935-1020 OT Time Calculation (min):  45 min  Charges: OT General Charges $OT Visit: 1 Visit OT Treatments $Self Care/Home Management : 23-37 mins  Maurie Boettcher, OT/L   Acute OT Clinical Specialist [place] Pager [phone] Office [phone]   [facility] 05/21/2022, 12:37 PM

## 2022-05-21 NOTE — TOC Progression Note (Signed)
Transition of Care [facility]) - Progression Note    Patient Details  Name: Tara [name] MRN: [id number] Date of Birth: 1946/01/08  [facility] CM/SW [place], RN Phone Number:[phone]  05/21/2022, 2:01 PM  Clinical Narrative:    CM received message from PT requesting for PT to initiate caregiver education in preparation for discharge home. CM has made PT aware that TOC is following for medical stability for discharge readiness. MD please advise if it is appropriate for PT to initiate family education for discharge planning.     Expected Discharge Plan: [place] Barriers to Discharge: No Barriers Identified  Expected Discharge Plan and Services Expected Discharge Plan: [place]   Discharge Planning Services: CM Consult   Living arrangements for the past 2 months: [place]: [place]         Social Determinants of Health (SDOH) Interventions    Readmission Risk Interventions    03/06/2022   12:36 PM  Readmission Risk Prevention Plan  Transportation Screening Complete  HRI or [place] Complete  Social Work Consult for [place] Planning/Counseling Complete

## 2022-05-21 NOTE — Progress Notes (Signed)
Patient ID: Tara [name], female   DOB: 1945/12/08, 76 y.o.   MRN: [id number] BP (!) 107/52 (BP Location: Right Leg)   Pulse 90   Temp 98.8 F (37.1 C) (Oral)   Resp 19   Ht '4\' 9"'$  (1.448 m)   Wt 74.7 kg   SpO2 92%   BMI 35.64 kg/m  Alert, being bathed.  Moving all extremities Working still with PT Family and Social work arranging placement and discharge plans

## 2022-05-21 NOTE — Progress Notes (Signed)
Physical Therapy Treatment Patient Details Name: Tara [name] MRN: [id number] DOB: [birth year] Today's Date: 05/21/2022   History of Present Illness 76 yo woman admitted 03/15/22 with new onset seizures related to meningioma. s/p tumor resection 7/3. Recent DC from [facility] 6/22. MRI on 7/26 shows: SDH extending inferiorly along the clivus and  narrowing the foramen magnum with possible mass effect. PMH: CKD stage 3, HFpEF, a fib, meningioma.    PT Comments    Patient progressing with mobility today using Stedy standing transfer assist device instead of mechanical assist transfer device.  She engaged in self feeding with OT and kicked her legs out to command while up in chair.  She had frequent episodes of vacant staring to L upper quadrant, but could quickly localize to voice when encouraged and name called.  Noted family plans on taking pt home.  Feel she will benefit from HHPT/aide and equipment as noted below.  Have initiated caregiver education, but will be ongoing.  PT to continue to follow acutely.   Recommendations for follow up therapy are one component of a multi-disciplinary discharge planning process, led by the attending physician.  Recommendations may be updated based on patient status, additional functional criteria and insurance authorization.  Follow Up Recommendations  Home health PT Can patient physically be transported by private vehicle: No   Assistance Recommended at Discharge Frequent or constant Supervision/Assistance  Patient can return home with the following Two people to help with walking and/or transfers;Two people to help with bathing/dressing/bathroom;Assist for transportation;Assistance with feeding;Assistance with Education officer, environmental (measurements PT);Wheelchair cushion (measurements PT);Hospital bed;Other (comment);BSC/3in1 (hoyer lift)    Recommendations for Other Services       Precautions / Restrictions  Precautions Precautions: Fall Precaution Comments: seizures Required Braces or Orthoses: Other Brace Other Brace: bilat resting hand splints,  ankle contracture boot, B Prafos Restrictions Weight Bearing Restrictions: No     Mobility  Bed Mobility Overal bed mobility: Needs Assistance Bed Mobility: Supine to Sit     Supine to sit: Total assist, HOB elevated     General bed mobility comments: Decreased initation of movement    Transfers Overall transfer level: Needs assistance   Transfers: Sit to/from Stand, Bed to chair/wheelchair/BSC Sit to Stand: Max assist, +2 physical assistance           General transfer comment: Max A +2 to stand after placing B hands on rails; Once standing, pt stood and did not sit on paddles; tactile cues to help facilitate sit due to perseveration Transfer via Lift Equipment: Stedy  Ambulation/Gait                   Stairs             Wheelchair Mobility    Modified Rankin (Stroke Patients Only) Modified Rankin (Stroke Patients Only) Pre-Morbid Rankin Score: Moderately severe disability Modified Rankin: Severe disability     Balance Overall balance assessment: Needs assistance Sitting-balance support: Feet supported, Bilateral upper extremity supported Sitting balance-Leahy Scale: Poor Sitting balance - Comments: assist to flex head/neck to help reduce posterior bias Postural control: Posterior lean Standing balance support: Bilateral upper extremity supported Standing balance-Leahy Scale: Poor Standing balance comment: holding onto handles on stedy pt able to maintain with hips not on seat on stedy during movement to recliner                            Cognition Arousal/Alertness:  Awake/alert Behavior During Therapy: Flat affect, Anxious Overall Cognitive Status: Impaired/Different from baseline Area of Impairment: Attention, Following commands, Safety/judgement, Awareness, Problem solving                    Current Attention Level: Focused, Sustained   Following Commands: Follows one step commands inconsistently Safety/Judgement: Decreased awareness of safety, Decreased awareness of deficits Awareness: Intellectual Problem Solving: Slow processing, Decreased initiation, Difficulty sequencing, Requires verbal cues, Requires tactile cues General Comments: Less verbal today however sequencing improved at times; staring off superioroly at times, however when her name was called, would look at therapist and engage for a short period before staring off again - always staring to superior quadrant (appeared skewed to the left)        Exercises Other Exercises Other Exercises: ankle PF stretch extended time (>30 sec) on R and 2 x 20 sec on L    General Comments General comments (skin integrity, edema, etc.): initially eating breakfast and family feeding her, OT worked on self feeding pt taking toast to her mouth with R UE and drinking from cup with handle with min A      Pertinent Vitals/Pain Pain Assessment Faces Pain Scale: Hurts a little bit Pain Location: general grimacing with movement Pain Descriptors / Indicators: Grimacing Pain Intervention(s): Monitored during session    Home Living                          Prior Function            PT Goals (current goals can now be found in the care plan section) Progress towards PT goals: Progressing toward goals    Frequency    Min 3X/week      PT Plan Discharge plan needs to be updated    Co-evaluation PT/OT/SLP Co-Evaluation/Treatment: Yes Reason for Co-Treatment: Complexity of the patient's impairments (multi-system involvement);For patient/therapist safety;To address functional/ADL transfers PT goals addressed during session: Mobility/safety with mobility;Balance;Strengthening/ROM OT goals addressed during session: ADL's and self-care;Strengthening/ROM      AM-PAC PT "6 Clicks" Mobility   Outcome  Measure  Help needed turning from your back to your side while in a flat bed without using bedrails?: A Lot Help needed moving from lying on your back to sitting on the side of a flat bed without using bedrails?: A Lot Help needed moving to and from a bed to a chair (including a wheelchair)?: Total Help needed standing up from a chair using your arms (e.g., wheelchair or bedside chair)?: Total Help needed to walk in hospital room?: Total Help needed climbing 3-5 steps with a railing? : Total 6 Click Score: 8    End of Session Equipment Utilized During Treatment: Gait belt;Oxygen Activity Tolerance: Patient tolerated treatment well Patient left: in chair;with call bell/phone within reach;with chair alarm set;with family/visitor present Nurse Communication: Need for lift equipment PT Visit Diagnosis: Other abnormalities of gait and mobility (R26.89);Other symptoms and signs involving the nervous system (R29.898)     Time: 0347-4259 PT Time Calculation (min) (ACUTE ONLY): 45 min  Charges:  $Therapeutic Activity: 8-22 mins                     Tara [name], PT Acute Rehabilitation Services Office:[phone] 05/21/2022    Tara [name] 05/21/2022, 1:18 PM

## 2022-05-21 NOTE — Progress Notes (Addendum)
Tara NOTE    Tara [name]  TUU:[id number] DOB: [birth year] DOA: 03/15/2022 PCP: [name], MD  Brief Narrative:  76 year old African-American female PMH of anxiety, depression, type II DM, HLD, PAF, on Eliquis, CKD 3B, breast cancer SP chemoradiation presented to hospital on 6/24 with new onset seizures. Known history of sphenoidal meningioma > increased in size on the CTw/ surrounding edema, Transferred to [facility]  She underwent LTM with no further seizures and was evaluated by neurosurgery and underwent bifrontal craniotomy for meningioma resection on 03/24/2020 and was transferred to the intensive care unit postoperatively with PCCM consulting. 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  Treated with Unasyn for 5 days. 7/11 recurrent sz like activity. Not correlated on EEG. CT unchanged.  7/13 neurology signed off. 7/19 started on ABX for sepsis then transition to Unasyn. 7/21 palliative care was consulted.>  Family wants full aggressive scope of Rx 7/23 CT chest and abdomen largely unremarkable 7/25 MRI brain > increasing mass effect, SDH as well as subacute/acute small scattered infarct. 7/26 tube feeds resumed, neurology reconsulted.  Eliquis resumed per neurosurgery 7/27 oxygenation improving, mentation improving, neurology following.  CTA head and neck without any LVO.  Lower extremity Doppler without any DVT.  Echo shows new systolic dysfunction with global hypokinesis EF 40 to 45% and significant RV failure. 7/30 had another aspiration event. Tube feeds on hold. Hypoxic> NRB. Remains full code. No tracheostomy per husband. Per Neurosurgery, give at least two weeks if not more to see if she improves.  8/1 oxygenation  improving again to 5 L.  Trickle tube feeds started. 8/9 on D1 honey thick liquid diet.  Calorie count initiated. 8/14 core track removed nocturnal tube feeds stopped. 8/15 leukocytosis worsening, abdomen distended, x-ray abdomen shows evidence of possible sigmoid volvulus, GI consulted, CT abdomen negative for any volvulus and only shows redundant sigmoid colon.  Will resume diet. 8/24 keppra dose decreased 8/26 ritalin started 8/16-present: leukocytosis of unknown etiology persists 8/29-8/30: Vomiting after a.m. meds, Ritalin discontinued   Subjective: -Vomiting this morning, no other events overnight, denies any abdominal pain or urinary symptoms  Assessment and Plan:  Seizures Continue Keppra, Vimpat EEG 8/24 with moderate diffuse encephalopathy, no seizures or epileptiform discharges PM&R suggested to consider decreasing keppra, Dr. Florene Glen discussed with neurology on-call, Keppra dose decreased to 750 mg twice daily on 8/24  Meningioma, cerebral ([place]) -Underwent bilateral craniotomy, meningioma resection on 7/3, Dr. Christella Noa  Encephalopathy, lethargy PM&R recommending considering ritalin 5 mg twice daily  -Started on low-dose Ritalin 8/26, discontinuing today on account of nausea and vomiting -Mental status slowly improving, continues to have frequent lipsmacking,?  Tardive dyskinesia  Nausea and vomiting -Suspect this is secondary to Ritalin, noted after meds yesterday and today, discontinue and monitor, also check KUB  Leukocytosis -Now improving  Acute respiratory failure with hypoxia ([place]) -Improved, completed ABX for aspiration pneumonia -SLP following, diet upgraded  Acute on chronic combined systolic and diastolic CHF Most recent Transthoracic Echocardiogram with LVEF of 40-45% (down from 55%) with global hypokinesis and associated RV dysfunction noted.  -Currently euvolemic, continue Lasix -Would benefit from sleep study as outpatient  Hypertension - Continue  lasix, currently on midodrine  Anxiety and depression Previously on Zoloft and Klonopin which are discontinued.  GERD -Continue  Protonix  Atrial fibrillation, chronic (HCC) Patient is on rate/rhythm control and on anticoagulation. -Continue amiodarone and Eliquis  AKI (acute kidney injury) ([place]) Possibly secondary to lasix although now has stabilized. Weight appears to have downtrended from admission but is stable for the past week.  Hypokalemia Replace and follow  Hyperlipidemia -Continue Crestor  Dysphagia Previously with a Cortrak and tube feedings. Evaluated/followed by speech therapy. -Speech therapy recommendations (8/28)  - regular solids, whole meds with puree (see note)  Acute postoperative anemia due to expected blood loss Acute anemia down to 6.5 requiring 2 units of PRBC. Hemoglobin currently stable.  Hypernatremia-resolved as of 05/07/2022 Resolved.  Thrombocytopenia (HCC)-resolved as of 05/07/2022 Resolved.  DVT prophylaxis: Apixaban Code Status: Full code Family Communication: Caregiver at bedside Disposition Plan: To be determined, declined by CIR  Consultants:    Procedures:   Antimicrobials:    Objective: Vitals:   05/20/22 2302 05/21/22 0313 05/21/22 0718 05/21/22 1117  BP: (!) 101/52 104/65 (!) 107/52 109/64  Pulse: 89 90 90 88  Resp: '20 18 19 '$ (!) 22  Temp: 98.2 F (36.8 C) 98.4 F (36.9 C) 98.8 F (37.1 C) 98.1 F (36.7 C)  TempSrc: Oral Oral Oral Oral  SpO2: 93% 99% 92% 90%  Weight:      Height:        Intake/Output Summary (Last 24 hours) at 05/21/2022 1438 Last data filed at 05/21/2022 0129 Gross per 24 hour  Intake --  Output 350 ml  Net -350 ml   Filed Weights   05/03/22 0345 05/04/22 0413 05/06/22 0500  Weight: 73.6 kg 74.7 kg 74.7 kg    Examination:  General exam: Appears calm and comfortable  Respiratory system: Clear to auscultation Cardiovascular system: S1 & S2 heard, RRR.  Abd: nondistended, soft and  nontender.Normal bowel sounds heard. Central nervous system: Alert and oriented. No focal neurological deficits. Extremities: no edema Skin: No rashes Psychiatry:  Mood & affect appropriate.     Data Reviewed:   CBC: Recent Labs  Lab 05/16/22 0243 05/17/22 0227 05/18/22 0530 05/19/22 0302 05/20/22 0136 05/21/22 0842  WBC 18.5* 17.3* 16.5* 16.5* 16.0* 12.6*  NEUTROABS 14.7* 13.5*  --  12.8* 12.5* 9.8*  HGB 8.9* 8.6* 9.2* 8.9* 8.9* 9.3*  HCT 27.3* 26.4* 29.4* 27.3* 27.5* 29.4*  MCV 86.4 84.9 88.0 85.6 85.9 87.2  PLT 242 222 252 261 251 427   Basic Metabolic Panel: Recent Labs  Lab 05/16/22 0243 05/17/22 0227 05/18/22 0530 05/19/22 0302 05/20/22 0136 05/21/22 0842  NA 139 140 144 143 139 143  K 3.5 3.1* 4.0 3.7 3.8 3.8  CL 107 109 112* 110 107 109  CO2 22 21* 20* 23 21* 25  GLUCOSE 116* 120* 108* 105* 107* 100*  BUN 27* 26* 27* 25* 27* 24*  CREATININE 1.10* 0.97 0.97 1.15* 1.01* 1.13*  CALCIUM 8.7* 8.6* 8.9 8.7* 8.8* 9.0  MG 1.8 1.7  --  1.7 1.8 1.9  PHOS 4.7* 4.1  --  4.7* 5.0* 4.8*   GFR: Estimated Creatinine Clearance: 35.4 mL/min (A) (by C-G formula based on SCr of 1.13 mg/dL (H)). Liver Function Tests: Recent Labs  Lab 05/16/22 0243 05/17/22 0227 05/19/22 0302 05/20/22 0136 05/21/22 0842  AST '23 21 25 24 22  '$ ALT '25 27 28 26 26  '$ ALKPHOS 96 98 93 95 96  BILITOT 0.5 0.4 0.5 0.4 0.3  PROT 6.2* 6.4* 6.5 6.4* 6.8  ALBUMIN 2.6* 2.7* 2.7* 2.7* 2.9*   No results for input(s): "LIPASE", "AMYLASE" in  the last 168 hours. No results for input(s): "AMMONIA" in the last 168 hours. Coagulation Profile: No results for input(s): "INR", "PROTIME" in the last 168 hours. Cardiac Enzymes: No results for input(s): "CKTOTAL", "CKMB", "CKMBINDEX", "TROPONINI" in the last 168 hours. BNP (last 3 results) No results for input(s): "PROBNP" in the last 8760 hours. HbA1C: No results for input(s): "HGBA1C" in the last 72 hours. CBG: No results for input(s): "GLUCAP" in the  last 168 hours. Lipid Profile: No results for input(s): "CHOL", "HDL", "LDLCALC", "TRIG", "CHOLHDL", "LDLDIRECT" in the last 72 hours. Thyroid Function Tests: No results for input(s): "TSH", "T4TOTAL", "FREET4", "T3FREE", "THYROIDAB" in the last 72 hours. Anemia Panel: No results for input(s): "VITAMINB12", "FOLATE", "FERRITIN", "TIBC", "IRON", "RETICCTPCT" in the last 72 hours. Urine analysis:    Component Value Date/Time   COLORURINE YELLOW 04/08/2022 [place] 04/08/2022 1317   LABSPEC 1.017 04/08/2022 1317   PHURINE 7.0 04/08/2022 1317   GLUCOSEU 150 (A) 04/08/2022 1317   HGBUR NEGATIVE 04/08/2022 1317   [place] 04/08/2022 1317   KETONESUR NEGATIVE 04/08/2022 1317   PROTEINUR 100 (A) 04/08/2022 1317   UROBILINOGEN 0.2 02/07/2013 1859   NITRITE NEGATIVE 04/08/2022 1317   LEUKOCYTESUR NEGATIVE 04/08/2022 1317   Sepsis Labs: '@LABRCNTIP'$ (procalcitonin:4,lacticidven:4)  )No results found for this or any previous visit (from the past 240 hour(s)).   Radiology Studies: No results found.   Scheduled Meds:  amiodarone  200 mg Oral Daily   apixaban  5 mg Oral BID   budesonide (PULMICORT) nebulizer solution  0.25 mg Nebulization BID   ciprofloxacin  2 drop Right Eye Q4H while awake   furosemide  40 mg Oral QODAY   lacosamide  200 mg Oral BID   levETIRAcetam  750 mg Oral BID   midodrine  5 mg Oral BID WC   montelukast  10 mg Oral QHS   multivitamin with minerals  1 tablet Oral Daily   mouth rinse  15 mL Mouth Rinse 4 times per day   pantoprazole  40 mg Oral BID   rosuvastatin  20 mg Oral Daily   senna-docusate  1 tablet Oral BID   Continuous Infusions:   LOS: 67 days    Time spent: 84mn    PDomenic Polite MD [facility]   05/21/2022, 2:38 PM

## 2022-05-22 DIAGNOSIS — R569 Unspecified convulsions: Secondary | ICD-10-CM | POA: Diagnosis not present

## 2022-05-22 LAB — COMPREHENSIVE METABOLIC PANEL
ALT: 28 U/L (ref 0–44)
AST: 27 U/L (ref 15–41)
Albumin: 2.9 g/dL — ABNORMAL LOW (ref 3.5–5.0)
Alkaline Phosphatase: 95 U/L (ref 38–126)
Anion gap: 10 (ref 5–15)
BUN: 23 mg/dL (ref 8–23)
CO2: 24 mmol/L (ref 22–32)
Calcium: 9 mg/dL (ref 8.9–10.3)
Chloride: 107 mmol/L (ref 98–111)
Creatinine, Ser: 1.22 mg/dL — ABNORMAL HIGH (ref 0.44–1.00)
GFR, Estimated: 46 mL/min — ABNORMAL LOW (ref >60)
Glucose, Bld: 104 mg/dL — ABNORMAL HIGH (ref 70–99)
Potassium: 4 mmol/L (ref 3.5–5.1)
Sodium: 141 mmol/L (ref 135–145)
Total Bilirubin: 0.3 mg/dL (ref 0.3–1.2)
Total Protein: 6.7 g/dL (ref 6.5–8.1)

## 2022-05-22 LAB — CBC
HCT: 28.2 % — ABNORMAL LOW (ref 36.0–46.0)
Hemoglobin: 9.1 g/dL — ABNORMAL LOW (ref 12.0–15.0)
MCH: 27.7 pg (ref 26.0–34.0)
MCHC: 32.3 g/dL (ref 30.0–36.0)
MCV: 86 fL (ref 80.0–100.0)
Platelets: 291 10*3/uL (ref 150–400)
RBC: 3.28 MIL/uL — ABNORMAL LOW (ref 3.87–5.11)
RDW: 17.1 % — ABNORMAL HIGH (ref 11.5–15.5)
WBC: 12.3 10*3/uL — ABNORMAL HIGH (ref 4.0–10.5)
nRBC: 0 % (ref 0.0–0.2)

## 2022-05-22 NOTE — Progress Notes (Signed)
Speech Language Pathology Treatment: Dysphagia;Cognitive-Linquistic  Patient Details Name: Tara [name] MRN: [id number] DOB: [birth year] Today's Date: 05/22/2022 Time: 4193-7902 SLP Time Calculation (min) (ACUTE ONLY): 24 min  Assessment / Plan / Recommendation Clinical Impression  Pt was seen for treatment with her husband present. Pt's family and RN stated that the pt has been tolerating the diet without overt s/s of aspiration. However, pt's nurse stated that emesis has been noted after meals and that ritalin was suspected to be the cause. Pt had recently finished eating, but tolerated puree and nectar thick liquids via straw (using consecutive swallows) without overt s/s of aspiration. Per pt's husband, pt has been participating in conversation with her family in person and on the phone. Pt inconsistently responded to questions with single-word utterances. She named objects, but exhibited difficulty attending for further participation in language production. Cueing was necessary for attention throughout the session with variable response to prompting. With verbal and visual prompts, pt was able to attend and read a single section from a printed prescription. Pt's current diet will be continued with observance of swallowing precautions. SLP will continue to follow pt.     HPI HPI: Pt is a 76 y/o who presented 6/24 with new onset seizures. 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 EEG negative for seizures. Neurosurgey consulted and did not recommend meningioma resection as of 6/27. Decline in swallow function noted overnight 6/27. Dx acute toxic metabolic encephalopathy suspected secondary to keppra. Pt s/p Bifrontal craniotomy for resection of meningioma 7/3.  Cortrak 7/7. 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  CXR 8/6 concerning for patchy airspace disease. PMH: HTN, HLD, GERD, CKD stage IIIb, chronic HFpEF, meningioma, breast CA-s/p right lumpectomy and  chemoradiation in 2008, depression/anxiety. BSE 03/11/22: regular texture diet and thin liquids without need for follow up. MBS 8/8 recommended Dys 1, honey thick.      SLP Plan  Continue with current plan of care      Recommendations for follow up therapy are one component of a multi-disciplinary discharge planning process, led by the attending physician.  Recommendations may be updated based on patient status, additional functional criteria and insurance authorization.    Recommendations  Diet recommendations: Regular;Nectar-thick liquid Liquids provided via: Cup;Straw Medication Administration: Crushed with puree Supervision: Full supervision/cueing for compensatory strategies Compensations: Slow rate;Small sips/bites;Clear throat intermittently Postural Changes and/or Swallow Maneuvers: Seated upright 90 degrees                Oral Care Recommendations: Oral care BID Follow Up Recommendations: Skilled nursing-short term rehab (<3 hours/day) Assistance recommended at discharge: Frequent or constant Supervision/Assistance SLP Visit Diagnosis: Dysphagia, oropharyngeal phase (R13.12) Plan: Continue with current plan of care         Tara [name] I. Tara [name], [place], [place] Office number [phone]  Tara [name]  05/22/2022, 9:27 AM

## 2022-05-22 NOTE — Progress Notes (Signed)
PROGRESS NOTE    Tara [name]  XFG:[id number] DOB: 08/28/1946 DOA: 03/15/2022 PCP: [name], MD  Brief Narrative:  76 year old African-American female PMH of anxiety, depression, type II DM, HLD, PAF, on Eliquis, CKD 3B, breast cancer SP chemoradiation presented to hospital on 6/24 with new onset seizures. Known history of sphenoidal meningioma > increased in size on the CTw/ surrounding edema, Transferred to [facility].  She underwent LTM with no further seizures and was evaluated by neurosurgery and underwent bifrontal craniotomy for meningioma resection on 03/24/2020 and was transferred to the intensive care unit postoperatively with PCCM consulting. 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  Treated with Unasyn for 5 days. 7/11 recurrent sz like activity. Not correlated on EEG. CT unchanged.  7/13 neurology signed off. 7/19 started on ABX for sepsis then changed to Unasyn. 7/21 palliative care was consulted.>  Family wants full aggressive scope of Rx 7/23 CT chest and abdomen largely unremarkable 7/25 MRI brain > increasing mass effect, SDH as well as subacute/acute small scattered infarct. 7/26 tube feeds resumed, neurology reconsulted.  Eliquis resumed per neurosurgery 7/27 oxygenation improving, mentation improving, neurology following.  CTA head and neck without any LVO.  Lower extremity Doppler without any DVT.  Echo shows new systolic dysfunction with global hypokinesis EF 40 to 45% and significant RV failure. 7/30 had another aspiration event. Tube feeds on hold. Hypoxic> NRB. Remains full code. No tracheostomy per husband. Per Neurosurgery, give at least two weeks if not more to see if she improves.  8/1 oxygenation improving  again to 5 L.  Trickle tube feeds started. 8/9 on D1 honey thick liquid diet.  Calorie count initiated. 8/14 core track removed nocturnal tube feeds stopped. 8/15 leukocytosis worsening, abdomen distended, x-ray abdomen shows evidence of possible sigmoid volvulus, GI consulted, CT abdomen negative for any volvulus and only shows redundant sigmoid colon.  Will resume diet. 8/24 keppra dose decreased 8/26 ritalin started 8/16-present: leukocytosis of unknown etiology persists 8/29-8/30: Vomiting after a.m. meds, Ritalin discontinued   Subjective: -No events overnight, mental status mostly stable, continues to vaccine [place], no further episodes of vomiting  Assessment and Plan:  Seizures Continue Keppra, Vimpat EEG 8/24 with moderate diffuse encephalopathy, no seizures or epileptiform discharges PM&R suggested to consider decreasing keppra, Dr. Florene Glen discussed with neurology on-call, Keppra dose decreased to 750 mg twice daily on 8/24  Meningioma, cerebral ([place]) -Underwent bilateral craniotomy, meningioma resection on 7/3, Dr. Christella Noa -Postop course complicated by seizures, SDH and strokes  Encephalopathy, lethargy PM&R recommending considering ritalin 5 mg twice daily  -Started on low-dose Ritalin 8/26, discontinuing today on account of nausea and vomiting -Mental status slowly improving, continues to have frequent lipsmacking,?  Tardive dyskinesia  Nausea and vomiting -Suspect this is secondary to Ritalin, noted after meds last 2 days, now discontinued, symptoms have improved, KUB unremarkable  Leukocytosis -Now improving  Acute respiratory failure with hypoxia ([place]) -Improved, completed ABX for aspiration pneumonia -SLP following, diet upgraded  Acute on chronic combined systolic and diastolic CHF Most recent Transthoracic Echocardiogram with LVEF of 40-45% (down from 55%) with global hypokinesis and associated RV dysfunction noted.  -Currently euvolemic, continue  Lasix -Would benefit from sleep study as outpatient  Hypertension - Continue lasix, currently on midodrine  Anxiety and depression  Previously on Zoloft and Klonopin which were discontinued  GERD -Continue Protonix  Atrial fibrillation, chronic (HCC) Patient is on rate/rhythm control and on anticoagulation. -Continue amiodarone and Eliquis  AKI (acute kidney injury) ([place]) -Resolved  Hypokalemia Replace and follow  Hyperlipidemia -Continue Crestor  Dysphagia Previously with a Cortrak and tube feedings. Evaluated/followed by speech therapy. -Speech therapy recommendations (8/28)  - regular solids, whole meds with puree (see note)  Acute postoperative anemia due to expected blood loss Acute anemia down to 6.5 requiring 2 units of PRBC. Hemoglobin currently stable.  Hypernatremia-resolved as of 05/07/2022 Resolved.  Thrombocytopenia (HCC)-resolved as of 05/07/2022 Resolved.  DVT prophylaxis: Apixaban Code Status: Full code Family Communication: Sister in law at bedside Disposition Plan: To be determined, declined by CIR  Procedures:   Antimicrobials:    Objective: Vitals:   05/22/22 0340 05/22/22 0717 05/22/22 0825 05/22/22 1123  BP: (!) 95/49 (!) 105/51  131/78  Pulse: 87 99 94 87  Resp: 15 15 (!) 22 17  Temp: 98.1 F (36.7 C) 98.3 F (36.8 C)  98.5 F (36.9 C)  TempSrc: Oral Oral  Oral  SpO2: 97% 96% 94% 91%  Weight:      Height:        Intake/Output Summary (Last 24 hours) at 05/22/2022 1408 Last data filed at 05/22/2022 0720 Gross per 24 hour  Intake 300 ml  Output 500 ml  Net -200 ml   Filed Weights   05/03/22 0345 05/04/22 0413 05/06/22 0500  Weight: 73.6 kg 74.7 kg 74.7 kg    Examination:  General exam: Chronically ill female sitting up in recliner, awake alert, oriented to self and partly to place, constant lipsmacking movements noted  HEENT: No JVD, surgical scars CVS: S1-S2, regular rhythm,  Lungs: Decreased breath sounds at the  bases Abdomen: Soft, obese, nontender, bowel sounds present Extremities: No edema  Psychiatry: Flat affect, poor insight and judgment    Data Reviewed:   CBC: Recent Labs  Lab 05/16/22 0243 05/17/22 0227 05/18/22 0530 05/19/22 0302 05/20/22 0136 05/21/22 0842 05/22/22 0210  WBC 18.5* 17.3* 16.5* 16.5* 16.0* 12.6* 12.3*  NEUTROABS 14.7* 13.5*  --  12.8* 12.5* 9.8*  --   HGB 8.9* 8.6* 9.2* 8.9* 8.9* 9.3* 9.1*  HCT 27.3* 26.4* 29.4* 27.3* 27.5* 29.4* 28.2*  MCV 86.4 84.9 88.0 85.6 85.9 87.2 86.0  PLT 242 222 252 261 251 268 176   Basic Metabolic Panel: Recent Labs  Lab 05/16/22 0243 05/17/22 0227 05/18/22 0530 05/19/22 0302 05/20/22 0136 05/21/22 0842 05/22/22 0210  NA 139 140 144 143 139 143 141  K 3.5 3.1* 4.0 3.7 3.8 3.8 4.0  CL 107 109 112* 110 107 109 107  CO2 22 21* 20* 23 21* 25 24  GLUCOSE 116* 120* 108* 105* 107* 100* 104*  BUN 27* 26* 27* 25* 27* 24* 23  CREATININE 1.10* 0.97 0.97 1.15* 1.01* 1.13* 1.22*  CALCIUM 8.7* 8.6* 8.9 8.7* 8.8* 9.0 9.0  MG 1.8 1.7  --  1.7 1.8 1.9  --   PHOS 4.7* 4.1  --  4.7* 5.0* 4.8*  --    GFR: Estimated Creatinine Clearance: 32.8 mL/min (A) (by C-G formula based on SCr of 1.22 mg/dL (H)). Liver Function Tests: Recent Labs  Lab 05/17/22 0227 05/19/22 0302 05/20/22 0136 05/21/22 0842 05/22/22 0210  AST '21 25 24 22 27  '$ ALT '27 28 26 26 28  '$ ALKPHOS 98 93 95 96 95  BILITOT 0.4 0.5 0.4 0.3 0.3  PROT 6.4* 6.5  6.4* 6.8 6.7  ALBUMIN 2.7* 2.7* 2.7* 2.9* 2.9*   No results for input(s): "LIPASE", "AMYLASE" in the last 168 hours. No results for input(s): "AMMONIA" in the last 168 hours. Coagulation Profile: No results for input(s): "INR", "PROTIME" in the last 168 hours. Cardiac Enzymes: No results for input(s): "CKTOTAL", "CKMB", "CKMBINDEX", "TROPONINI" in the last 168 hours. BNP (last 3 results) No results for input(s): "PROBNP" in the last 8760 hours. HbA1C: No results for input(s): "HGBA1C" in the last 72  hours. CBG: No results for input(s): "GLUCAP" in the last 168 hours. Lipid Profile: No results for input(s): "CHOL", "HDL", "LDLCALC", "TRIG", "CHOLHDL", "LDLDIRECT" in the last 72 hours. Thyroid Function Tests: No results for input(s): "TSH", "T4TOTAL", "FREET4", "T3FREE", "THYROIDAB" in the last 72 hours. Anemia Panel: No results for input(s): "VITAMINB12", "FOLATE", "FERRITIN", "TIBC", "IRON", "RETICCTPCT" in the last 72 hours. Urine analysis:    Component Value Date/Time   COLORURINE YELLOW 04/08/2022 [place] 04/08/2022 1317   LABSPEC 1.017 04/08/2022 1317   PHURINE 7.0 04/08/2022 1317   GLUCOSEU 150 (A) 04/08/2022 1317   HGBUR NEGATIVE 04/08/2022 1317   [place] 04/08/2022 1317   KETONESUR NEGATIVE 04/08/2022 1317   PROTEINUR 100 (A) 04/08/2022 1317   UROBILINOGEN 0.2 02/07/2013 1859   NITRITE NEGATIVE 04/08/2022 1317   LEUKOCYTESUR NEGATIVE 04/08/2022 1317   Sepsis Labs: '@LABRCNTIP'$ (procalcitonin:4,lacticidven:4)  )No results found for this or any previous visit (from the past 240 hour(s)).   Radiology Studies: DG Abd Portable 2V  Result Date: 05/21/2022 CLINICAL DATA:  Nausea vomiting x2 days. EXAM: PORTABLE ABDOMEN - 2 VIEW COMPARISON:  Abdominal radiograph dated 05/13/2022 and CT dated 05/06/2022. FINDINGS: Oral contrast noted throughout the colon and within the rectum. No bowel dilatation or evidence of obstruction. No free air. Right upper quadrant cholecystectomy clips. The osseous structures are intact. The soft tissues are unremarkable. IMPRESSION: No evidence of bowel obstruction. Electronically Signed   By: Anner Crete M.D.   On: 05/21/2022 17:28     Scheduled Meds:  amiodarone  200 mg Oral Daily   apixaban  5 mg Oral BID   budesonide (PULMICORT) nebulizer solution  0.25 mg Nebulization BID   ciprofloxacin  2 drop Right Eye Q4H while awake   furosemide  20 mg Oral QODAY   lacosamide  200 mg Oral BID   levETIRAcetam  750 mg  Oral BID   midodrine  5 mg Oral BID WC   montelukast  10 mg Oral QHS   multivitamin with minerals  1 tablet Oral Daily   mouth rinse  15 mL Mouth Rinse 4 times per day   pantoprazole  40 mg Oral BID   rosuvastatin  20 mg Oral Daily   senna-docusate  1 tablet Oral BID   Continuous Infusions:   LOS: 68 days    Time spent: 83mn    PDomenic Polite MD [facility]   05/22/2022, 2:08 PM

## 2022-05-22 NOTE — Progress Notes (Signed)
NEUROSURGERY PROGRESS NOTE  Doing well. Denies any headaches. Awaiting discharge placement. No acute events overnight. Continue to work with therapy  Temp:  [98.1 F (36.7 C)-98.8 F (37.1 C)] 98.1 F (36.7 C) (08/31 0340) Pulse Rate:  [87-90] 87 (08/31 0340) Resp:  [15-22] 15 (08/31 0340) BP: (95-109)/(49-64) 95/49 (08/31 0340) SpO2:  [90 %-97 %] 97 % (08/31 0340)    Eleonore Chiquito, NP 05/22/2022 6:31 AM

## 2022-05-23 DIAGNOSIS — Z515 Encounter for palliative care: Secondary | ICD-10-CM | POA: Diagnosis not present

## 2022-05-23 DIAGNOSIS — R569 Unspecified convulsions: Secondary | ICD-10-CM | POA: Diagnosis not present

## 2022-05-23 LAB — CBC
HCT: 29.2 % — ABNORMAL LOW (ref 36.0–46.0)
Hemoglobin: 9.1 g/dL — ABNORMAL LOW (ref 12.0–15.0)
MCH: 27.2 pg (ref 26.0–34.0)
MCHC: 31.2 g/dL (ref 30.0–36.0)
MCV: 87.4 fL (ref 80.0–100.0)
Platelets: 273 10*3/uL (ref 150–400)
RBC: 3.34 MIL/uL — ABNORMAL LOW (ref 3.87–5.11)
RDW: 17 % — ABNORMAL HIGH (ref 11.5–15.5)
WBC: 10.9 10*3/uL — ABNORMAL HIGH (ref 4.0–10.5)
nRBC: 0 % (ref 0.0–0.2)

## 2022-05-23 MED ORDER — MIDODRINE HCL 5 MG PO TABS
2.5000 mg | ORAL_TABLET | Freq: Two times a day (BID) | ORAL | Status: DC
  Administered 2022-05-23 – 2022-06-01 (×18): 2.5 mg via ORAL
  Filled 2022-05-23 (×2): qty 1
  Filled 2022-05-23: qty 0.5
  Filled 2022-05-23 (×15): qty 1

## 2022-05-23 NOTE — Progress Notes (Signed)
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 s/p tumor resection 7/3. Recent DC from [facility] 6/22. MRI on 7/26 shows: SDH extending inferiorly along the clivus and  narrowing the foramen magnum with possible mass effect. PMH: CKD stage 3, HFpEF, a fib, meningioma.    PT Comments    The pt was agreeable to session with focus on continued standing activity with stedy. With increased time and maxA as well as assist from LUE, the pt was able to rise to standing in stedy x4 for 5-15 seconds at a time. The pt also tolerated transfer from recliner to bed with use of stedy and mod-maxA to maintain balance with movement. Will continue to benefit from skilled PT to progress functional strength, power, and dynamic stability.    Recommendations for follow up therapy are one component of a multi-disciplinary discharge planning process, led by the attending physician.  Recommendations may be updated based on patient status, additional functional criteria and insurance authorization.  Follow Up Recommendations  Home health PT (per family request) Can patient physically be transported by private vehicle: No   Assistance Recommended at Discharge Frequent or constant Supervision/Assistance  Patient can return home with the following Two people to help with walking and/or transfers;Two people to help with bathing/dressing/bathroom;Assist for transportation;Assistance with feeding;Assistance with Education officer, environmental (measurements PT);Wheelchair cushion (measurements PT);Hospital bed;Other (comment);BSC/3in1    Recommendations for Other Services       Precautions / Restrictions Precautions Precautions: Fall Precaution Comments: seizures Required Braces or Orthoses: Other Brace Other Brace:  bilat resting hand splints,  ankle contracture boot, B Prafos Restrictions Weight Bearing Restrictions: No     Mobility  Bed Mobility Overal bed mobility: Needs Assistance Bed Mobility: Rolling, Sit to Supine Rolling: Max assist     Sit to supine: Max assist   General bed mobility comments: pt assisting with movement of head down but needing assist for LE and trunk rolling    Transfers Overall transfer level: Needs assistance Equipment used: Ambulation equipment used Transfers: Sit to/from Stand, Bed to chair/wheelchair/BSC Sit to Stand: Max assist Stand pivot transfers: Total assist         General transfer comment: maxA with increased time to reach standing. assist to place LUE on bar and to rise. assist to maintain balance with use of stedy to transfer from recliner to bed. completed sit-stand 4x` Transfer via Lift Equipment: Stedy      Modified Rankin (Stroke Patients Only) Modified Rankin (Stroke Patients Only) Pre-Morbid Rankin Score: Moderately severe disability Modified Rankin: Severe disability     Balance Overall balance assessment: Needs assistance Sitting-balance support: Feet supported, Bilateral upper extremity supported Sitting balance-Leahy Scale: Poor Sitting balance - Comments: assist to flex head/neck to help reduce posterior bias Postural control: Posterior lean Standing balance support: Bilateral upper extremity supported Standing balance-Leahy Scale: Poor Standing balance comment: holding onto handles on stedy pt able to maintain with hips not on seat on stedy during movement to recliner                            Cognition Arousal/Alertness: Awake/alert Behavior During Therapy: Flat affect, Anxious Overall Cognitive Status: Impaired/Different from baseline Area of Impairment: Attention, Following commands, Safety/judgement, Awareness, Problem solving  Current Attention Level: Focused, Sustained    Following Commands: Follows one step commands inconsistently Safety/Judgement: Decreased awareness of safety, Decreased awareness of deficits Awareness: Intellectual Problem Solving: Slow processing, Decreased initiation, Difficulty sequencing, Requires verbal cues, Requires tactile cues General Comments: Less verbal today however sequencing improved at times. pt able to rise to standing when cued but continues to need significant increased time. no verbal response to questions or following of simple commands for movement of extremitied        Exercises General Exercises - Upper Extremity Shoulder Flexion: PROM, Right, 5 reps Shoulder ABduction: PROM, Right, 5 reps Elbow Extension: PROM, Right, 5 reps General Exercises - Lower Extremity Heel Slides: PROM, Both, 10 reps, Supine Other Exercises Other Exercises: calf stretch x15 seconds x 3 each side Other Exercises: pulling forwards with LUE and core x5    General Comments General comments (skin integrity, edema, etc.): VSS on 2L with activity      Pertinent Vitals/Pain Pain Assessment Pain Assessment: Faces Faces Pain Scale: Hurts a little bit Pain Location: grimacing with movement of RUE Pain Descriptors / Indicators: Grimacing Pain Intervention(s): Limited activity within patient's tolerance, Monitored during session, Repositioned     PT Goals (current goals can now be found in the care plan section) Acute Rehab PT Goals Patient Stated Goal: family wishing for home once able to care for her PT Goal Formulation: With patient/family Time For Goal Achievement: 05/28/22 Potential to Achieve Goals: Fair Progress towards PT goals: Progressing toward goals    Frequency    Min 3X/week      PT Plan Current plan remains appropriate       AM-PAC PT "6 Clicks" Mobility   Outcome Measure  Help needed turning from your back to your side while in a flat bed without using bedrails?: A Lot Help needed moving from lying on  your back to sitting on the side of a flat bed without using bedrails?: A Lot Help needed moving to and from a bed to a chair (including a wheelchair)?: Total Help needed standing up from a chair using your arms (e.g., wheelchair or bedside chair)?: Total Help needed to walk in hospital room?: Total Help needed climbing 3-5 steps with a railing? : Total 6 Click Score: 8    End of Session Equipment Utilized During Treatment: Gait belt;Oxygen Activity Tolerance: Patient tolerated treatment well Patient left: in bed;with call bell/phone within reach;with bed alarm set;with family/visitor present Nurse Communication: Need for lift equipment PT Visit Diagnosis: Other abnormalities of gait and mobility (R26.89);Other symptoms and signs involving the nervous system (F68.127)     Time: 5170-0174 PT Time Calculation (min) (ACUTE ONLY): 26 min  Charges:  $Therapeutic Exercise: 8-22 mins $Therapeutic Activity: 8-22 mins                     West Carbo, PT, DPT   Acute Rehabilitation Department   Sandra Cockayne 05/23/2022, 2:43 PM

## 2022-05-23 NOTE — Progress Notes (Signed)
Subjective: Patient reports no headaches  Objective: Vital signs in last 24 hours: Temp:  [97.8 F (36.6 C)-99.1 F (37.3 C)] 99.1 F (37.3 C) (09/01 0315) Pulse Rate:  [84-94] 85 (09/01 0315) Resp:  [17-22] 19 (09/01 0315) BP: (90-131)/(53-78) 90/56 (09/01 0315) SpO2:  [91 %-98 %] 98 % (09/01 0315)  Intake/Output from previous day: 08/31 0701 - 09/01 0700 In: 700 [P.O.:700] Out: 300 [Urine:300] Intake/Output this shift: No intake/output data recorded.  Neurologic: Grossly normal  Lab Results: Lab Results  Component Value Date   WBC 10.9 (H) 05/23/2022   HGB 9.1 (L) 05/23/2022   HCT 29.2 (L) 05/23/2022   MCV 87.4 05/23/2022   PLT 273 05/23/2022   Lab Results  Component Value Date   INR 1.1 02/27/2022   BMET Lab Results  Component Value Date   NA 141 05/22/2022   K 4.0 05/22/2022   CL 107 05/22/2022   CO2 24 05/22/2022   GLUCOSE 104 (H) 05/22/2022   BUN 23 05/22/2022   CREATININE 1.22 (H) 05/22/2022   CALCIUM 9.0 05/22/2022    Studies/Results: DG Abd Portable 2V  Result Date: 05/21/2022 CLINICAL DATA:  Nausea vomiting x2 days. EXAM: PORTABLE ABDOMEN - 2 VIEW COMPARISON:  Abdominal radiograph dated 05/13/2022 and CT dated 05/06/2022. FINDINGS: Oral contrast noted throughout the colon and within the rectum. No bowel dilatation or evidence of obstruction. No free air. Right upper quadrant cholecystectomy clips. The osseous structures are intact. The soft tissues are unremarkable. IMPRESSION: No evidence of bowel obstruction. Electronically Signed   By: Anner Crete M.D.   On: 05/21/2022 17:28    Assessment/Plan: S/p crani for tumor resection of Dr. Alphonzo Dublin. Awaiting SNF placement. Continue therapy for now.    LOS: 69 days    Tara [name] 05/23/2022, 7:28 AM

## 2022-05-23 NOTE — Progress Notes (Signed)
PROGRESS NOTE    LANELLE LINDO  MVE:[id number] DOB: [birth year] DOA: 03/15/2022 PCP: [name], MD  Brief Narrative:  76 year old African-American female PMH of anxiety, depression, type II DM, HLD, PAF, on Eliquis, CKD 3B, breast cancer SP chemoradiation presented to hospital on 6/24 with new onset seizures. Known history of sphenoidal meningioma > increased in size on the CTw/ surrounding edema, Transferred to [facility].  She underwent LTM with no further seizures and was evaluated by neurosurgery and underwent bifrontal craniotomy for meningioma resection on 03/24/2020 and was transferred to the intensive care unit postoperatively with PCCM consulting. 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  Treated with Unasyn for 5 days. 7/11 recurrent sz like activity. Not correlated on EEG. CT unchanged.  7/13 neurology signed off. 7/19 started on ABX for sepsis then changed to Unasyn. 7/21 palliative care was consulted.>  Family wants full aggressive scope of Rx 7/23 CT chest and abdomen largely unremarkable 7/25 MRI brain > increasing mass effect, SDH as well as subacute/acute small scattered infarct. 7/26 tube feeds resumed, neurology reconsulted.  Eliquis resumed per neurosurgery 7/27 oxygenation improving, mentation improving, neurology following.  CTA head and neck without any LVO.  Lower extremity Doppler without any DVT.  Echo shows new systolic dysfunction with global hypokinesis EF 40 to 45% and significant RV failure. 7/30 had another aspiration event. Tube feeds on hold. Hypoxic> NRB. Remains full code. No tracheostomy per husband. Per Neurosurgery, give at least two weeks if not more to see if she improves.  8/1 oxygenation improving  again to 5 L.  Trickle tube feeds started. 8/9 on D1 honey thick liquid diet.  Calorie count initiated. 8/14 core track removed nocturnal tube feeds stopped. 8/15 leukocytosis worsening, abdomen distended, x-ray abdomen shows evidence of possible sigmoid volvulus, GI consulted, CT abdomen negative for any volvulus and only shows redundant sigmoid colon.  Will resume diet. 8/24 keppra dose decreased 8/26 ritalin started 8/16-present: leukocytosis of unknown etiology persists 8/29-8/30: Vomiting after a.m. meds, Ritalin discontinued   Subjective: -Feels okay, no events overnight, no further nausea or vomiting, woke up late, Breakfast will be eating an early lunch today  Assessment and Plan:  Seizures Continue Keppra, Vimpat EEG 8/24 with moderate diffuse encephalopathy, no seizures or epileptiform discharges PM&R suggested to consider decreasing keppra, Dr. Florene Glen discussed with neurology on-call, Keppra dose decreased to 750 mg twice daily on 8/24 -Needs close follow-up with neurology -She is medically stable for discharge from my standpoint, further DC plans per neurosurgery  Meningioma, cerebral ([place]) -Underwent bilateral craniotomy, meningioma resection on 7/3, Dr. Christella Noa -Postop course complicated by seizures, SDH and strokes  Encephalopathy, lethargy PM&R recommended considering ritalin 5 mg twice daily  -Started on low-dose Ritalin 8/26, disc continued this on account of nausea and vomiting -Mental status slowly improving, continues to have frequent lipsmacking,?  Tardive dyskinesia  Nausea and vomiting -Suspect this is secondary to Ritalin, noted after meds last 2 days, now discontinued, symptoms have improved, KUB unremarkable -Resolved  Leukocytosis -Now improving  Acute respiratory failure with hypoxia ([place]) -Improved, completed ABX for aspiration pneumonia -SLP following, diet upgraded  Acute on chronic combined systolic and diastolic CHF Most recent  Transthoracic Echocardiogram with LVEF of 40-45% (down from 55%) with global hypokinesis and associated RV  dysfunction noted.  -Currently euvolemic, continue Lasix -Would benefit from sleep study as outpatient  Hypertension - Continue lasix, currently on midodrine  Anxiety and depression Previously on Zoloft and Klonopin which were discontinued during her long ICU stay  GERD -Continue Protonix  Atrial fibrillation, chronic ([place]) Patient is on rate/rhythm control and on anticoagulation. -Continue amiodarone and Eliquis  AKI (acute kidney injury) ([place]) -Resolved  Hypokalemia Replace and follow  Hyperlipidemia -Continue Crestor  Dysphagia Previously with a Cortrak and tube feedings. Evaluated/followed by speech therapy. -Speech therapy recommendations (8/28) - regular solids, whole meds with puree (see note)  Acute postoperative anemia due to expected blood loss Acute anemia down to 6.5 requiring 2 units of PRBC. Hemoglobin currently stable.  Hypernatremia-resolved as of 05/07/2022 Resolved.  Thrombocytopenia (HCC)-resolved as of 05/07/2022 Resolved.  DVT prophylaxis: Apixaban Code Status: Full code Family Communication: Caregiver at bedside Disposition Plan: Home with home health services  Procedures:   Antimicrobials:    Objective: Vitals:   05/23/22 0005 05/23/22 0315 05/23/22 0758 05/23/22 1148  BP: (!) 111/56 (!) 90/56 (!) 121/57 136/75  Pulse: 84 85 84 88  Resp: 17 19 (!) 21 20  Temp: 99 F (37.2 C) 99.1 F (37.3 C) 97.6 F (36.4 C) 97.7 F (36.5 C)  TempSrc: Oral Axillary Oral Oral  SpO2: 92% 98% 92% 99%  Weight:      Height:        Intake/Output Summary (Last 24 hours) at 05/23/2022 1339 Last data filed at 05/23/2022 0015 Gross per 24 hour  Intake --  Output 300 ml  Net -300 ml   Filed Weights   05/03/22 0345 05/04/22 0413 05/06/22 0500  Weight: 73.6 kg 74.7 kg 74.7 kg    Examination:  General exam: Chronically ill elderly female sitting  up in the recliner, awake alert oriented to self and partly to place, cognitive deficits noted constant lipsmacking movements, surgical scars on scalp HEENT: No JVD CVS: S1-S2, regular rhythm Lungs: Decreased breath sounds to bases Abdomen: Soft, obese, nontender, bowel sounds present Extremities: No edema Neuro: Follows commands, tracks, answers some questions appropriately, cognitive deficits noted, increased tone and spasticity noted in both upper extremities, 3+ strength in both lower extremities  Extremities: No edema  Psychiatry: Flat affect, poor insight and judgment    Data Reviewed:   CBC: Recent Labs  Lab 05/17/22 0227 05/18/22 0530 05/19/22 0302 05/20/22 0136 05/21/22 0842 05/22/22 0210 05/23/22 0346  WBC 17.3*   < > 16.5* 16.0* 12.6* 12.3* 10.9*  NEUTROABS 13.5*  --  12.8* 12.5* 9.8*  --   --   HGB 8.6*   < > 8.9* 8.9* 9.3* 9.1* 9.1*  HCT 26.4*   < > 27.3* 27.5* 29.4* 28.2* 29.2*  MCV 84.9   < > 85.6 85.9 87.2 86.0 87.4  PLT 222   < > 261 251 268 291 273   < > = values in this interval not displayed.   Basic Metabolic Panel: Recent Labs  Lab 05/17/22 0227 05/18/22 0530 05/19/22 0302 05/20/22 0136 05/21/22 0842 05/22/22 0210  NA 140 144 143 139 143 141  K 3.1* 4.0 3.7 3.8 3.8 4.0  CL 109 112* 110 107 109 107  CO2 21* 20* 23 21* 25 24  GLUCOSE 120* 108* 105* 107* 100* 104*  BUN 26* 27* 25* 27* 24* 23  CREATININE 0.97 0.97 1.15* 1.01* 1.13* 1.22*  CALCIUM 8.6* 8.9 8.7* 8.8* 9.0 9.0  MG 1.7  --  1.7 1.8 1.9  --  PHOS 4.1  --  4.7* 5.0* 4.8*  --    GFR: Estimated Creatinine Clearance: 32.8 mL/min (A) (by C-G formula based on SCr of 1.22 mg/dL (H)). Liver Function Tests: Recent Labs  Lab 05/17/22 0227 05/19/22 0302 05/20/22 0136 05/21/22 0842 05/22/22 0210  AST '21 25 24 22 27  '$ ALT '27 28 26 26 28  '$ ALKPHOS 98 93 95 96 95  BILITOT 0.4 0.5 0.4 0.3 0.3  PROT 6.4* 6.5 6.4* 6.8 6.7  ALBUMIN 2.7* 2.7* 2.7* 2.9* 2.9*   No results for input(s):  "LIPASE", "AMYLASE" in the last 168 hours. No results for input(s): "AMMONIA" in the last 168 hours. Coagulation Profile: No results for input(s): "INR", "PROTIME" in the last 168 hours. Cardiac Enzymes: No results for input(s): "CKTOTAL", "CKMB", "CKMBINDEX", "TROPONINI" in the last 168 hours. BNP (last 3 results) No results for input(s): "PROBNP" in the last 8760 hours. HbA1C: No results for input(s): "HGBA1C" in the last 72 hours. CBG: No results for input(s): "GLUCAP" in the last 168 hours. Lipid Profile: No results for input(s): "CHOL", "HDL", "LDLCALC", "TRIG", "CHOLHDL", "LDLDIRECT" in the last 72 hours. Thyroid Function Tests: No results for input(s): "TSH", "T4TOTAL", "FREET4", "T3FREE", "THYROIDAB" in the last 72 hours. Anemia Panel: No results for input(s): "VITAMINB12", "FOLATE", "FERRITIN", "TIBC", "IRON", "RETICCTPCT" in the last 72 hours. Urine analysis:    Component Value Date/Time   COLORURINE YELLOW 04/08/2022 [place] 04/08/2022 1317   LABSPEC 1.017 04/08/2022 1317   PHURINE 7.0 04/08/2022 1317   GLUCOSEU 150 (A) 04/08/2022 1317   HGBUR NEGATIVE 04/08/2022 1317   [place] 04/08/2022 1317   KETONESUR NEGATIVE 04/08/2022 1317   PROTEINUR 100 (A) 04/08/2022 1317   UROBILINOGEN 0.2 02/07/2013 1859   NITRITE NEGATIVE 04/08/2022 1317   LEUKOCYTESUR NEGATIVE 04/08/2022 1317   Sepsis Labs: '@LABRCNTIP'$ (procalcitonin:4,lacticidven:4)  )No results found for this or any previous visit (from the past 240 hour(s)).   Radiology Studies: DG Abd Portable 2V  Result Date: 05/21/2022 CLINICAL DATA:  Nausea vomiting x2 days. EXAM: PORTABLE ABDOMEN - 2 VIEW COMPARISON:  Abdominal radiograph dated 05/13/2022 and CT dated 05/06/2022. FINDINGS: Oral contrast noted throughout the colon and within the rectum. No bowel dilatation or evidence of obstruction. No free air. Right upper quadrant cholecystectomy clips. The osseous structures are intact. The  soft tissues are unremarkable. IMPRESSION: No evidence of bowel obstruction. Electronically Signed   By: Anner Crete M.D.   On: 05/21/2022 17:28     Scheduled Meds:  amiodarone  200 mg Oral Daily   apixaban  5 mg Oral BID   ciprofloxacin  2 drop Right Eye Q4H while awake   furosemide  20 mg Oral QODAY   lacosamide  200 mg Oral BID   levETIRAcetam  750 mg Oral BID   midodrine  5 mg Oral BID WC   montelukast  10 mg Oral QHS   multivitamin with minerals  1 tablet Oral Daily   mouth rinse  15 mL Mouth Rinse 4 times per day   pantoprazole  40 mg Oral BID   rosuvastatin  20 mg Oral Daily   senna-docusate  1 tablet Oral BID   Continuous Infusions:   LOS: 69 days    Time spent: 37mn    PDomenic Polite MD [facility]   05/23/2022, 1:39 PM

## 2022-05-23 NOTE — TOC Progression Note (Addendum)
Transition of Care [facility]) - Progression Note    Patient Details  Name: TORIA MONTE MRN: [id number] Date of Birth: May 13, 1946  [facility]) CM/SW [place], RN Phone Number:[phone]  05/23/2022, 9:26 AM  Clinical Narrative:    TOC continues to follow. There is no a placement issue. TOC has confirmed that family is refusing SNF , patient has been determined per CIR to not be a candidate. CM previously spoke with daughter [name] who confirms that daughter will bring patient home when it is determined that patient is medically stable for discharge. TOC remains on standby and ready to set up Home health , DME and therapy teaching at bedside for the family. MD please advise. Is this patient medically stable for discharge?   Expected Discharge Plan: [place] Barriers to Discharge: No Barriers Identified  Expected Discharge Plan and Services Expected Discharge Plan: [place]   Discharge Planning Services: CM Consult   Living arrangements for the past 2 months: [place]: [place]         Social Determinants of Health (SDOH) Interventions    Readmission Risk Interventions    03/06/2022   12:36 PM  Readmission Risk Prevention Plan  Transportation Screening Complete  HRI or [place] Complete  Social Work Consult for [place] Planning/Counseling Complete

## 2022-05-23 NOTE — Progress Notes (Signed)
Palliative Medicine Inpatient Follow Up Note  HPI: Patient is a 76 year old African-American female currently with past medical history significant for but not limited to anxiety, depression, diabetes mellitus type 2, hypertension, hyperlipidemia, cardiomyopathy, history of atrial fibrillation on anticoagulation with Eliquis, chronic kidney disease stage IIIb, history of breast cancer status post chemoradiation in 2008 as well as other comorbidities who presented to the hospital on 03/15/2022 with new onset seizures.  She had a known sphenoidal meningioma that was noted to be increased in size on the CT scan with surrounding edema so she is transferred from [facility] for further evaluation.  She underwent LTM with no further seizures and was evaluated by neurosurgery and underwent bifrontal craniotomy for meningioma resection on 03/24/2020. She has been minimally responsive since.   Has had a prolonged 36 day hospitalization.   Palliative care has been asked to get involved to discuss goals of care.   Today's Discussion 05/23/2022  *Please note that this is a verbal dictation therefore any spelling or grammatical errors are due to the "[place] One" system interpretation.  Chart reviewed inclusive of vital signs, progress notes, laboratory results, and diagnostic images. Patient has been making slow improvements.  I met with patients bedside RN, Wells Guiles. She shares that patient has done well eating for the most part. She is OOB 2/2 hoyer lift. Tolerating well. Plan to place an alarm belt around her lap for safety.Patient had some nausea though this had resolved.  I met with [name] at bedside this morning. She is awake and alert to self. She is vocalizing multiple words which is an improvement from the last time I saw here. She denies pain or nausea at the time of my visit.  I spoke to patients daughter, [name]. She shares that she is open to OP Palliative support and would welcome this.    The plan will be for patient to transition to Rachel's home and for her to be her mothers primary CG with her father as a secondary. [name] vocalized relief at improvements.   Plan to continue with watchful waiting for improvements.   Objective Assessment: Vital Signs Vitals:   05/23/22 0758 05/23/22 1148  BP: (!) 121/57 136/75  Pulse: 84 88  Resp: (!) 21 20  Temp: 97.6 F (36.4 C) 97.7 F (36.5 C)  SpO2: 92% 99%    Intake/Output Summary (Last 24 hours) at 05/23/2022 1319 Last data filed at 05/23/2022 0015 Gross per 24 hour  Intake --  Output 300 ml  Net -300 ml    Last Weight  Most recent update: 05/06/2022  5:38 AM    Weight  74.7 kg (164 lb 10.9 oz)            Gen: Frail elderly African-American female HEENT: Suturing across forehead, moist mucous membranes CV: Regular rate and rhythm PULM: On 1LPM [place], breathing even and nonlabored ABD: soft/nontender EXT: Generalized edema Neuro: A+O to self  SUMMARY OF RECOMMENDATIONS   Full Code/Full Scope of Care Continue to allow time for outcomes Plan for [facility] Acute Care to discharge home once medically stable TOC --> Family is Open to OP Palliative support PMT will continue to follow incrementally  ______________________________________________________________________________________ [place] Team Team Cell Phone: [phone] Please utilize secure chat with additional questions, if there is no response within 30 minutes please call the above phone number  Palliative Medicine Team providers are available by phone from 7am to 7pm daily and can be reached through the team cell phone.  Should this patient require assistance outside of these hours, please call the patient's attending physician.     

## 2022-05-24 DIAGNOSIS — R569 Unspecified convulsions: Secondary | ICD-10-CM | POA: Diagnosis not present

## 2022-05-24 NOTE — Progress Notes (Signed)
Overall stable.  Making slow steady progress.  Denies headache.  Awake and aware.  Answers questions appropriately.  Motor 5/5 bilaterally.  Wound healing well.  Status post subfrontal tumor resection.  Continue efforts at supportive care.

## 2022-05-24 NOTE — Progress Notes (Signed)
PROGRESS NOTE    Tara [name]  HYI:[id number] DOB: March 30, 1946 DOA: 03/15/2022 PCP: [name], MD  Brief Narrative:  76 year old African-American female PMH of anxiety, depression, type II DM, HLD, PAF, on Eliquis, CKD 3B, breast cancer SP chemoradiation presented to hospital on 6/24 with new onset seizures. Known history of sphenoidal meningioma > increased in size on the CTw/ surrounding edema, Transferred to [facility].  She underwent LTM with no further seizures and was evaluated by neurosurgery and underwent bifrontal craniotomy for meningioma resection on 03/24/2020 and was transferred to the intensive care unit postoperatively with PCCM consulting. 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  Treated with Unasyn for 5 days. 7/11 recurrent sz like activity. Not correlated on EEG. CT unchanged.  7/13 neurology signed off. 7/19 started on ABX for sepsis then changed to Unasyn. 7/21 palliative care was consulted.>  Family wants full aggressive scope of Rx 7/23 CT chest and abdomen largely unremarkable 7/25 MRI brain > increasing mass effect, SDH as well as subacute/acute small scattered infarct. 7/26 tube feeds resumed, neurology reconsulted.  Eliquis resumed per neurosurgery 7/27 oxygenation improving, mentation improving, neurology following.  CTA head and neck without any LVO.  Lower extremity Doppler without any DVT.  Echo shows new systolic dysfunction with global hypokinesis EF 40 to 45% and significant RV failure. 7/30 had another aspiration event. Tube feeds on hold. Hypoxic> NRB. Remains full code. No tracheostomy per husband. Per Neurosurgery, give at least two weeks if not more to see if she improves.  8/1 oxygenation improving  again to 5 L.  Trickle tube feeds started. 8/9 on D1 honey thick liquid diet.  Calorie count initiated. 8/14 core track removed nocturnal tube feeds stopped. 8/15 leukocytosis worsening, abdomen distended, x-ray abdomen shows evidence of possible sigmoid volvulus, GI consulted, CT abdomen negative for any volvulus and only shows redundant sigmoid colon.  Will resume diet. 8/24 keppra dose decreased 8/26 ritalin started 8/16-present: leukocytosis of unknown etiology persists 8/29-8/30: Vomiting after a.m. meds, Ritalin discontinued   Subjective: -Feels okay, no events overnight, no further nausea or vomiting, back on 0.5 L of O2  Assessment and Plan:  Seizures Continue Keppra, Vimpat EEG 8/24 with moderate diffuse encephalopathy, no seizures or epileptiform discharges PM&R suggested to consider decreasing keppra, Dr. Florene Glen discussed with neurology on-call, Keppra dose decreased to 750 mg twice daily on 8/24 -Needs close follow-up with neurology -She is medically stable for discharge from my standpoint, further DC plans per neurosurgery,  -Needs palliative care follow-up  Meningioma, cerebral ([place]) -Underwent bilateral craniotomy, meningioma resection on 7/3, Dr. Christella Noa -Postop course complicated by seizures, SDH and strokes  Encephalopathy, lethargy PM&R recommended considering ritalin 5 mg twice daily  -Started on low-dose Ritalin 8/26, disc continued this on account of nausea and vomiting -Mental status slowly improving, continues to have frequent lipsmacking,?  Tardive dyskinesia  Nausea and vomiting -Suspect this is secondary to Ritalin, noted after meds last 2 days, now discontinued, symptoms have improved, KUB unremarkable -Resolved  Leukocytosis -Now improving  Acute respiratory failure with hypoxia ([place]) -Improved, completed ABX for aspiration pneumonia -SLP following, diet upgraded  Acute on chronic combined systolic and diastolic CHF Most recent Transthoracic  Echocardiogram with LVEF of 40-45% (down from 55%) with global hypokinesis and associated RV  dysfunction noted.  -Currently euvolemic, continue Lasix -Would benefit from sleep study as outpatient  Hypertension - Continue lasix, currently on midodrine  Anxiety and depression Previously on Zoloft and Klonopin which were discontinued during her long ICU stay  GERD -Continue Protonix  Atrial fibrillation, chronic ([place]) Patient is on rate/rhythm control and on anticoagulation. -Continue amiodarone and Eliquis  AKI (acute kidney injury) ([place]) -Resolved  Hypokalemia Replace and follow  Hyperlipidemia -Continue Crestor  Dysphagia Previously with a Cortrak and tube feedings. Evaluated/followed by speech therapy. -Speech therapy recommendations (8/28) - regular solids, whole meds with puree (see note)  Acute postoperative anemia due to expected blood loss Acute anemia down to 6.5 requiring 2 units of PRBC. Hemoglobin currently stable.  Hypernatremia-resolved as of 05/07/2022 Resolved.  Thrombocytopenia (HCC)-resolved as of 05/07/2022 Resolved.  DVT prophylaxis: Apixaban Code Status: Full code Family Communication: Husband at bedside Disposition Plan: Stable for discharge home, per neurosurgery  Procedures:   Antimicrobials:    Objective: Vitals:   05/23/22 1950 05/23/22 2300 05/24/22 0312 05/24/22 0724  BP: 105/63 98/66 101/61 98/80  Pulse: 85 85 86 85  Resp: 18 (!) '21 19 18  '$ Temp: 98.8 F (37.1 C) 97.8 F (36.6 C) 97.7 F (36.5 C) 98.8 F (37.1 C)  TempSrc: Axillary Axillary Oral Axillary  SpO2: 99% 96% 94% 95%  Weight:      Height:        Intake/Output Summary (Last 24 hours) at 05/24/2022 1251 Last data filed at 05/24/2022 0500 Gross per 24 hour  Intake --  Output 325 ml  Net -325 ml   Filed Weights   05/03/22 0345 05/04/22 0413 05/06/22 0500  Weight: 73.6 kg 74.7 kg 74.7 kg    Examination:  General exam: Chronically ill elderly female sitting up in  bed, awake alert, oriented to self and partly to place, cognitive deficits noted, intermittent lipsmacking, surgical scars,  CVS: S1-S2, regular rhythm Lungs: Decreased breath sounds to bases Abdomen: Soft, obese, nontender, bowel sounds present Extremities: No edema Neuro: Follows commands, tracks, answers some questions appropriately, cognitive deficits noted, increased tone and spasticity noted in both upper extremities, 3+ strength in both lower extremities  Psychiatry: Flat affect, poor insight and judgment    Data Reviewed:   CBC: Recent Labs  Lab 05/19/22 0302 05/20/22 0136 05/21/22 0842 05/22/22 0210 05/23/22 0346  WBC 16.5* 16.0* 12.6* 12.3* 10.9*  NEUTROABS 12.8* 12.5* 9.8*  --   --   HGB 8.9* 8.9* 9.3* 9.1* 9.1*  HCT 27.3* 27.5* 29.4* 28.2* 29.2*  MCV 85.6 85.9 87.2 86.0 87.4  PLT 261 251 268 291 893   Basic Metabolic Panel: Recent Labs  Lab 05/18/22 0530 05/19/22 0302 05/20/22 0136 05/21/22 0842 05/22/22 0210  NA 144 143 139 143 141  K 4.0 3.7 3.8 3.8 4.0  CL 112* 110 107 109 107  CO2 20* 23 21* 25 24  GLUCOSE 108* 105* 107* 100* 104*  BUN 27* 25* 27* 24* 23  CREATININE 0.97 1.15* 1.01* 1.13* 1.22*  CALCIUM 8.9 8.7* 8.8* 9.0 9.0  MG  --  1.7 1.8 1.9  --   PHOS  --  4.7* 5.0* 4.8*  --    GFR: Estimated Creatinine Clearance: 32.8 mL/min (A) (by C-G formula based on SCr of 1.22 mg/dL (H)). Liver Function Tests: Recent Labs  Lab 05/19/22 0302 05/20/22 0136 05/21/22 0842 05/22/22 0210  AST '25 24 22 27  '$ ALT '28 26 26 28  '$ ALKPHOS 93 95 96 95  BILITOT 0.5 0.4 0.3  0.3  PROT 6.5 6.4* 6.8 6.7  ALBUMIN 2.7* 2.7* 2.9* 2.9*   No results for input(s): "LIPASE", "AMYLASE" in the last 168 hours. No results for input(s): "AMMONIA" in the last 168 hours. Coagulation Profile: No results for input(s): "INR", "PROTIME" in the last 168 hours. Cardiac Enzymes: No results for input(s): "CKTOTAL", "CKMB", "CKMBINDEX", "TROPONINI" in the last 168 hours. BNP (last 3  results) No results for input(s): "PROBNP" in the last 8760 hours. HbA1C: No results for input(s): "HGBA1C" in the last 72 hours. CBG: No results for input(s): "GLUCAP" in the last 168 hours. Lipid Profile: No results for input(s): "CHOL", "HDL", "LDLCALC", "TRIG", "CHOLHDL", "LDLDIRECT" in the last 72 hours. Thyroid Function Tests: No results for input(s): "TSH", "T4TOTAL", "FREET4", "T3FREE", "THYROIDAB" in the last 72 hours. Anemia Panel: No results for input(s): "VITAMINB12", "FOLATE", "FERRITIN", "TIBC", "IRON", "RETICCTPCT" in the last 72 hours. Urine analysis:    Component Value Date/Time   COLORURINE YELLOW 04/08/2022 [place] 04/08/2022 1317   LABSPEC 1.017 04/08/2022 1317   PHURINE 7.0 04/08/2022 1317   GLUCOSEU 150 (A) 04/08/2022 1317   HGBUR NEGATIVE 04/08/2022 1317   [place] 04/08/2022 1317   KETONESUR NEGATIVE 04/08/2022 1317   PROTEINUR 100 (A) 04/08/2022 1317   UROBILINOGEN 0.2 02/07/2013 1859   NITRITE NEGATIVE 04/08/2022 1317   LEUKOCYTESUR NEGATIVE 04/08/2022 1317   Sepsis Labs: '@LABRCNTIP'$ (procalcitonin:4,lacticidven:4)  )No results found for this or any previous visit (from the past 240 hour(s)).   Radiology Studies: No results found.   Scheduled Meds:  amiodarone  200 mg Oral Daily   apixaban  5 mg Oral BID   ciprofloxacin  2 drop Right Eye Q4H while awake   furosemide  20 mg Oral QODAY   lacosamide  200 mg Oral BID   levETIRAcetam  750 mg Oral BID   midodrine  2.5 mg Oral BID WC   montelukast  10 mg Oral QHS   multivitamin with minerals  1 tablet Oral Daily   mouth rinse  15 mL Mouth Rinse 4 times per day   pantoprazole  40 mg Oral BID   rosuvastatin  20 mg Oral Daily   senna-docusate  1 tablet Oral BID   Continuous Infusions:   LOS: 70 days    Time spent: 33mn    PDomenic Polite MD [facility]   05/24/2022, 12:51 PM

## 2022-05-25 DIAGNOSIS — R569 Unspecified convulsions: Secondary | ICD-10-CM | POA: Diagnosis not present

## 2022-05-25 NOTE — Progress Notes (Signed)
NEUROSURGERY PROGRESS NOTE  Doing well s/p crani for tumor. Per patients husband she is doing a lot better than last week when CIR assessed her and denied her for CIR. I would like them to take another look at her to see if she would be a candidate not. Patients family is denying SNF at the moment per TOC. Continue therapy.  Temp:  [98 F (36.7 C)-98.8 F (37.1 C)] 98.8 F (37.1 C) (09/03 0340) Pulse Rate:  [77-94] 77 (09/03 0340) Resp:  [15-22] 15 (09/03 0340) BP: (125-141)/(53-80) 125/53 (09/03 0340) SpO2:  [91 %-97 %] 97 % (09/03 0340)    Tara Chiquito, NP 05/25/2022 8:28 AM

## 2022-05-25 NOTE — Progress Notes (Signed)
Patient seen and examined, husband at bedside, just ate breakfast - no changes from my note yesterday -Continues to make slow progress, still with moderate cognitive deficits, 1-2 word answers but level of alertness has improved -Discharge planning per neurosurgical team, medically stable  Domenic Polite MD

## 2022-05-26 DIAGNOSIS — D320 Benign neoplasm of cerebral meninges: Secondary | ICD-10-CM | POA: Diagnosis not present

## 2022-05-26 DIAGNOSIS — R569 Unspecified convulsions: Secondary | ICD-10-CM | POA: Diagnosis not present

## 2022-05-26 DIAGNOSIS — N179 Acute kidney failure, unspecified: Secondary | ICD-10-CM

## 2022-05-26 DIAGNOSIS — E785 Hyperlipidemia, unspecified: Secondary | ICD-10-CM | POA: Diagnosis not present

## 2022-05-26 LAB — BASIC METABOLIC PANEL
Anion gap: 11 (ref 5–15)
BUN: 21 mg/dL (ref 8–23)
CO2: 23 mmol/L (ref 22–32)
Calcium: 9 mg/dL (ref 8.9–10.3)
Chloride: 107 mmol/L (ref 98–111)
Creatinine, Ser: 1.14 mg/dL — ABNORMAL HIGH (ref 0.44–1.00)
GFR, Estimated: 50 mL/min — ABNORMAL LOW (ref >60)
Glucose, Bld: 101 mg/dL — ABNORMAL HIGH (ref 70–99)
Potassium: 3.4 mmol/L — ABNORMAL LOW (ref 3.5–5.1)
Sodium: 141 mmol/L (ref 135–145)

## 2022-05-26 LAB — CBC
HCT: 29.7 % — ABNORMAL LOW (ref 36.0–46.0)
Hemoglobin: 9.2 g/dL — ABNORMAL LOW (ref 12.0–15.0)
MCH: 26.9 pg (ref 26.0–34.0)
MCHC: 31 g/dL (ref 30.0–36.0)
MCV: 86.8 fL (ref 80.0–100.0)
Platelets: 274 10*3/uL (ref 150–400)
RBC: 3.42 MIL/uL — ABNORMAL LOW (ref 3.87–5.11)
RDW: 16.5 % — ABNORMAL HIGH (ref 11.5–15.5)
WBC: 11.4 10*3/uL — ABNORMAL HIGH (ref 4.0–10.5)
nRBC: 0 % (ref 0.0–0.2)

## 2022-05-26 MED ORDER — POTASSIUM CHLORIDE CRYS ER 20 MEQ PO TBCR
40.0000 meq | EXTENDED_RELEASE_TABLET | ORAL | Status: DC
  Administered 2022-05-26: 40 meq via ORAL
  Filled 2022-05-26: qty 2

## 2022-05-26 MED ORDER — POTASSIUM CHLORIDE 20 MEQ PO PACK
40.0000 meq | PACK | Freq: Once | ORAL | Status: AC
Start: 2022-05-26 — End: 2022-05-26
  Administered 2022-05-26: 40 meq via ORAL
  Filled 2022-05-26: qty 2

## 2022-05-26 NOTE — Progress Notes (Signed)
NEUROSURGERY PROGRESS NOTE  Doing well. Seems to be moving around in bed better today. I did consult CIR again to reconsider her since her husbands states shes doing better than last week. Family refusing SNF placement and not sure patient would be safe at home. Continue therapy  Temp:  [97.3 F (36.3 C)-98.9 F (37.2 C)] 97.3 F (36.3 C) (09/04 0742) Pulse Rate:  [81-91] 81 (09/04 0742) Resp:  [17-24] 19 (09/04 0742) BP: (107-137)/(51-85) 113/56 (09/04 0742) SpO2:  [90 %-99 %] 94 % (09/04 0742)    Tara Chiquito, NP 05/26/2022 8:28 AM

## 2022-05-26 NOTE — Progress Notes (Addendum)
Inpatient Rehabilitation Admissions Coordinator   Discussed case with Dr Naaman Plummer who has consulted on 8/24 and did follow up on 8/28. I called pt's spouse and then her daughter, [name] with his permission. She is not at  a level to tolerate the intensity required of a CIR level admit and needs prolonged rehab recovery before discharge home. [name] does not want SNF and prefers discharge home after family education with therapy has been completed. I have notified Acute therapy of her request for specific date and times for family education. Nursing education with family on care at home is also recommended. Acute team and TOC made aware. We are not pursuing Cir admit at this time and will sign off.  Danne Baxter, RN, MSN Rehab Admissions Coordinator [phone] 05/26/2022 4:07 PM

## 2022-05-26 NOTE — TOC Progression Note (Addendum)
Transition of Care [facility]) - Progression Note    Patient Details  Name: Tara [name] MRN: [id number] Date of Birth: March 11, 1946  Transition [facility]) CM/SW [place], RN Phone Number:[phone]  05/26/2022, 8:57 AM  Clinical Narrative:    Cm spoke with daughter [name] who was expressed concern because she was told over the weekend that patient was being discharged.CM reassured daughter that patient has not been discharged and that Dr. Christella Noa is the attending and he would be the physician to discharge. Daughter concerned because she want to focus on three goals Prior to discharge. Daughter goals are 1. She wants patient diet to be advanced as much as possible to regular diet with thin liquids. She understands that patient is currently on regular with nectar thin and will be ok if this is the safest diet for her. 2. Daughter is concerned why the patient can not be completely weaned off O2 and wants to have a better understanding of why she continues to need 1L. Would like to know why respiratory treatments have been discontinued? 3. Daughter wants patient to be able to stand and pivot.  Daughter made aware that CM would document conversation and goals. TOC continues to follow for discharge readiness in order to set up home health and DME needs. TOC is in no way trying to force anything. We are merely here to put all needs in place once the MD feels that patient is medically ready for discharge.    Expected Discharge Plan: [place] Barriers to Discharge: No Barriers Identified  Expected Discharge Plan and Services Expected Discharge Plan: [place]   Discharge Planning Services: CM Consult   Living arrangements for the past 2 months: [place]: [place]         Social Determinants of Health (SDOH) Interventions    Readmission Risk Interventions    03/06/2022   12:36 PM   Readmission Risk Prevention Plan  Transportation Screening Complete  HRI or [place] Complete  Social Work Consult for [place] Planning/Counseling Complete

## 2022-05-26 NOTE — Progress Notes (Signed)
Inpatient Rehabilitation Admissions Coordinator   Rehab consult received. I have discussed with Dr Naaman Plummer. I await his review of her therapy progress since his consult on 05/15/22 to discuss with family our current recommendations.  Danne Baxter, RN, MSN Rehab Admissions Coordinator [phone] 05/26/2022 1:10 PM

## 2022-05-26 NOTE — Progress Notes (Signed)
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 s/p tumor resection 7/3. Recent DC from [facility] 6/22. MRI on 7/26 shows: SDH extending inferiorly along the clivus and  narrowing the foramen magnum with possible mass effect. PMH: CKD stage 3, HFpEF, a fib, meningioma.   OT comments  Pt making slow steady progress, per family member Raysa was able to feed herself lunch with min A prior to this session. Pt continues to require max A +2 for sit<>stand with steady frame and is total A for transfers in the steady. She follows most simple one step commands to assist with tasks but needs significantly increased time. Pt mumbling words throughout session, difficulty to understand. Pt's LUE in fisted flexor pattern upon arrival with difficulty getting extension, L resting hand splint donned at the end of the session. OT to continue to follow, POC remains appropriate.    Recommendations for follow up therapy are one component of a multi-disciplinary discharge planning process, led by the attending physician.  Recommendations may be updated based on patient status, additional functional criteria and insurance authorization.    Follow Up Recommendations  Skilled nursing-short term rehab (<3 hours/day)    Assistance Recommended at Discharge Frequent or constant Supervision/Assistance  Patient can return home with the following  Two people to help with walking and/or transfers;Two people to help with bathing/dressing/bathroom;Assistance with cooking/housework;Assistance with feeding;Direct supervision/assist for medications management;Direct supervision/assist for financial management;Assist for transportation;Help with stairs or ramp for entrance   Equipment Recommendations  BSC/3in1;Wheelchair (measurements OT);Wheelchair cushion  (measurements OT);Hospital bed;Other (comment)    Recommendations for Other Services Other (comment)    Precautions / Restrictions Precautions Precautions: Fall Precaution Comments: seizures Required Braces or Orthoses: Other Brace Other Brace: bilat resting hand splints,  ankle contracture boot, B Prafos/ankle contracture boot (wearing schedules to be posted in room) Restrictions Weight Bearing Restrictions: No       Mobility Bed Mobility Overal bed mobility: Needs Assistance Bed Mobility: Sit to Supine       Sit to supine: Max assist, +2 for physical assistance Sit to sidelying: Independent General bed mobility comments: Assist for trunk and LEs    Transfers Overall transfer level: Needs assistance Equipment used: Ambulation equipment used, 2 person hand held assist Transfers: Sit to/from Stand, Bed to chair/wheelchair/BSC Sit to Stand: Mod assist, Max assist, +2 physical assistance Stand pivot transfers: Total assist         General transfer comment: Required assist to place BUE on bar of steady. Stood with mod A +2 initially. On second attempt to stand from bed with 2 person HHA, required max A +2. Transfer via Lift Equipment: Stedy   Balance Overall balance assessment: Needs assistance Sitting-balance support: No upper extremity supported, Feet supported Sitting balance-Leahy Scale: Poor     Standing balance support: Bilateral upper extremity supported Standing balance-Leahy Scale: Poor Standing balance comment: Reliant on BUE and external support                           ADL either performed or assessed with clinical judgement   ADL Overall ADL's : Needs assistance/impaired   Eating/Feeding Details (indicate cue type and reason): Per family, prior to session pt fed herself a burger with minimal assist  Toilet Transfer: Maximal assistance;+2 for safety/equipment;+2 for physical assistance Toilet Transfer Details  (indicate cue type and reason): sit<>stand with steady. Toileting- Clothing Manipulation and Hygiene: Total assistance;+2 for physical assistance;+2 for safety/equipment;Sit to/from stand Toileting - Clothing Manipulation Details (indicate cue type and reason): total A for peri care in standing     Functional mobility during ADLs: Maximal assistance;+2 for physical assistance General ADL Comments: focus on family goal of sit<>stand.    Extremity/Trunk Assessment Upper Extremity Assessment Upper Extremity Assessment: RUE deficits/detail;LUE deficits/detail RUE Deficits / Details: reaching for therapist and steady bar appropriately, opening and closing hand to command with increased time LUE Deficits / Details: fisted grasp upon arrival, very difficult to stretch into extension. Pt did grasp steady bar, but needed assist to initiate grasp/release. L resting hand splint donned at the end of the session due to flexor pattern   Lower Extremity Assessment Lower Extremity Assessment: Defer to PT evaluation        Vision   Vision Assessment?: Vision impaired- to be further tested in functional context Eye Alignment: Impaired (comment) Ocular Range of Motion: Impaired-to be further tested in functional context Alignment/Gaze Preference: Gaze right Tracking/Visual Pursuits: Impaired - to be further tested in functional context Additional Comments: tracks stimuli with increased cues, maintained R superior gaze the majority of the session   Perception Perception Perception: Impaired   Praxis Praxis Praxis: Impaired Praxis Impairment Details: Initiation;Motor planning;Perseveration    Cognition Arousal/Alertness: Awake/alert Behavior During Therapy: Flat affect Overall Cognitive Status: Impaired/Different from baseline Area of Impairment: Attention, Following commands, Safety/judgement, Awareness, Problem solving                 Orientation Level: Disoriented to, Place, Time,  Situation Current Attention Level: Focused Memory: Decreased recall of precautions, Decreased short-term memory Following Commands: Follows one step commands inconsistently, Follows one step commands with increased time Safety/Judgement: Decreased awareness of safety, Decreased awareness of deficits Awareness: Intellectual Problem Solving: Slow processing, Decreased initiation, Difficulty sequencing, Requires verbal cues, Requires tactile cues General Comments: Mumbled, repetitive statements throughout that were hard to understand. Following simple cues to stand but requiring increased time.        Exercises Other Exercises Other Exercises: PROM of L wrist and extension with stretching holds Other Exercises: PROM of LLE dorsiflex    Shoulder Instructions       General Comments VSS On 1L, on RA intially but increased WOB noted therefore donned 1L    Pertinent Vitals/ Pain       Pain Assessment Pain Assessment: Faces Faces Pain Scale: Hurts a little bit Pain Location: grimacing with stretching but denied pain Pain Descriptors / Indicators: Grimacing Pain Intervention(s): Monitored during session, Limited activity within patient's tolerance  Home Living                                          Prior Functioning/Environment              Frequency  Min 2X/week        Progress Toward Goals  OT Goals(current goals can now be found in the care plan section)  Progress towards OT goals: Progressing toward goals  Acute Rehab OT Goals Patient Stated Goal: per family for vickki to stand OT Goal Formulation: With family Time For Goal Achievement: 05/30/22 Potential to Achieve Goals: Fair ADL Goals Pt Will Perform Grooming: with mod assist;sitting Pt Will  Perform Upper Body Bathing: with mod assist;sitting Pt Will Perform Lower Body Bathing: with max assist;sit to/from stand Pt Will Perform Lower Body Dressing: with modified independence;sitting/lateral  leans;sit to/from stand Pt Will Transfer to Toilet: with max assist;with +2 assist;bedside commode Pt Will Perform Toileting - Clothing Manipulation and hygiene: with modified independence;sitting/lateral leans;sit to/from stand Additional ADL Goal #1: Pt will track to midline to find family members with moderate directional cues and max assist to turn head Additional ADL Goal #2: Pt will accurately rspond to questions when given choice of 2 answers Additional ADL Goal #3: Ptwill maintain midlien posutral control EOB with min A in preparation for ADL tasks Additional ADL Goal #4: pt will tolerate B resting hand splints at nights to prevent further flexor tightness B hands  Plan Discharge plan remains appropriate;Frequency remains appropriate    Co-evaluation    PT/OT/SLP Co-Evaluation/Treatment: Yes Reason for Co-Treatment: Complexity of the patient's impairments (multi-system involvement);For patient/therapist safety;To address functional/ADL transfers PT goals addressed during session: Balance;Mobility/safety with mobility OT goals addressed during session: ADL's and self-care      AM-PAC OT "6 Clicks" Daily Activity     Outcome Measure   Help from another person eating meals?: A Lot Help from another person taking care of personal grooming?: A Lot Help from another person toileting, which includes using toliet, bedpan, or urinal?: Total Help from another person bathing (including washing, rinsing, drying)?: Total Help from another person to put on and taking off regular upper body clothing?: Total Help from another person to put on and taking off regular lower body clothing?: Total 6 Click Score: 8    End of Session Equipment Utilized During Treatment: Oxygen  OT Visit Diagnosis: Unsteadiness on feet (R26.81);Other abnormalities of gait and mobility (R26.89);Muscle weakness (generalized) (M62.81);Low vision, both eyes (H54.2);Other symptoms and signs involving cognitive  function;Pain   Activity Tolerance Patient tolerated treatment well   Patient Left in chair;with call bell/phone within reach;with bed alarm set;with family/visitor present   Nurse Communication Mobility status;Need for lift equipment        Time: 1350-1416 OT Time Calculation (min): 26 min  Charges: OT General Charges $OT Visit: 1 Visit OT Treatments $Therapeutic Activity: 8-22 mins    Rozelia Catapano A Quantia Grullon 05/26/2022, 3:18 PM

## 2022-05-26 NOTE — Progress Notes (Signed)
Progress Note   Patient: Tara [name] TFT:[id number] DOB: [birth year] DOA: 03/15/2022     72 DOS: the patient was seen and examined on 05/26/2022   Brief hospital course: 76 year old African-American female PMH of anxiety, depression, type II DM, HLD, PAF, on Eliquis, CKD 3B, breast cancer SP chemoradiation presented to hospital on 6/24 with new onset seizures. Known history of sphenoidal meningioma that was noted to be increased in size on the CT scan with surrounding edema so she is transferred from [facility] for further evaluation.  She underwent LTM with no further seizures and was evaluated by neurosurgery and underwent bifrontal craniotomy for meningioma resection on 03/24/2020 and was transferred to the intensive care unit postoperatively with PCCM consulting while she was in ICU. 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  Treated with Unasyn for 5 days. 7/11 recurrent sz like activity. Not correlated on EEG. CT unchanged.  7/13 neurology signed off. 7/19 started on ABX for sepsis then transition to Unasyn. 7/21 palliative care was consulted. 7/23 CT chest and abdomen was performed negative for PE or significant pneumonia, without any hemorrhage 7/25 MRI brain without contrast shows increasing mass effect, SDH as well as subacute/acute small scattered infarct. 7/26 tube feeds resumed, neurology reconsulted.  Eliquis resumed per neurosurgery 7/27 oxygenation improving, mentation improving, neurology following.  CTA head and neck without any LVO.  Lower extremity Doppler without any DVT.  Echo shows new systolic dysfunction with global hypokinesis EF 40 to 45% and significant RV failure. 7/30 had another aspiration event. Tube feeds on  hold. Now on NRB. Remains full code. No tracheostomy per husband. Per Neurosurgery, give at least two weeks if not more to see if she improves.  8/1 oxygenation improving again to 5 L.  Trickle tube feeds started. 8/9 on D1 honey thick liquid diet.  Calorie count initiated. 8/13 oxygenation improving to 2 L. 8/14 core track removed nocturnal tube feeds stopped. 8/15 leukocytosis worsening, abdomen distended, x-ray abdomen shows evidence of possible sigmoid volvulus, GI consulted, CT abdomen negative for any volvulus and only shows redundant sigmoid colon.  Will resume diet. 8/24 keppra dose decreased 8/26 ritalin started 8/16-present: leukocytosis of unknown etiology persists 08/29 to 08/30 with vomiting.  09/03 continue to make slow progress with moderate cognitive deficits.   Assessment and Plan: * Seizure ([place]) Seizures Continue Keppra, Vimpat EEG 8/24 with moderate diffuse encephalopathy, no seizures or epileptiform discharges PM&R suggested to consider decreasing keppra, Dr. Florene Glen discussed with neurology on-call, Keppra dose decreased to 750 mg twice daily on 8/24 -Needs close follow-up with neurology -She is medically stable for discharge from my standpoint, further DC plans per neurosurgery,  -Needs palliative care follow-up   Meningioma, cerebral ([place]) -Underwent bilateral craniotomy, meningioma resection on 7/3, Dr. Christella Noa -Postop course complicated by seizures, SDH and strokes   Encephalopathy, lethargy PM&R recommended considering ritalin 5 mg twice daily  -Started on low-dose Ritalin 8/26, disc continued this on account of nausea and vomiting -Mental status slowly improving, continues to have frequent lipsmacking,?  Tardive dyskinesia   Nausea and vomiting -Suspect this is secondary to Ritalin, noted after meds last 2 days, now discontinued, symptoms have improved, KUB unremarkable -Resolved   Leukocytosis -Now improving   Acute respiratory failure with hypoxia  ([place]) -Improved,  completed ABX for aspiration pneumonia -SLP following, diet upgraded   Acute on chronic combined systolic and diastolic CHF Most recent Transthoracic Echocardiogram with LVEF of 40-45% (down from 55%) with global hypokinesis and associated RV dysfunction noted.  -Currently euvolemic, continue Lasix -Would benefit from sleep study as outpatient  Her volume status is improving    Hypertension - Continue furosemide for diuresis  on midodrine for blood pressure support.    Anxiety and depression Previously on Zoloft and Klonopin which were discontinued during her long ICU stay   GERD -Continue Protonix   Atrial fibrillation, chronic (HCC) Patient is on rate/rhythm control and on anticoagulation. -Continue amiodarone and Eliquis   AKI (acute kidney injury) ([place]) -Resolved   Hypokalemia Renal function with serum  Cr at 1,14 with K at 3,4 and serum bicarbonate at 23. Plan to continue K correction with KCl Follow up renal function in am.    Hyperlipidemia -Continue Crestor   Dysphagia Previously with a Cortrak and tube feedings. Evaluated/followed by speech therapy. -Speech therapy recommendations (8/28)     - regular solids, whole meds with puree (see note)   Acute postoperative anemia due to expected blood loss Acute anemia down to 6.5 requiring 2 units of PRBC. Hemoglobin currently stable.   Hypernatremia-resolved as of 05/07/2022 Resolved.   Thrombocytopenia (HCC)-resolved as of 05/07/2022 Resolved.  Meningioma, cerebral [facility]) Per neurosurgery  Lethargy PM&R recommending considering ritalin 5 mg twice daily  I discussed informally with cards, in absence of RVR and decompensated HF can consider this - started 8/26, will follow response  Leukocytosis Recurrent and persistent. Know specific infectious source identified although patient has been recently treated for recurrent aspiration pneumonia. Recently completed course of unasyn 8/3 CXR 8/19  concerning for edema vs pneumia repeat CXR without acute abnormality, low lung volumes  Acute respiratory failure with hypoxia (HCC) Secondary to pneumonia with associated tachypnea. Hypoxia improved but not resolved. Repeat chest x-ray with edema vs pneumonia. Patient with leukocytosis and no other infectious symptoms. Down to 1 L/min with Lasix. -Wean as able -repeat CXR with low lung volumes, no acute abnormality  Acute on chronic combined systolic and diastolic CHF (congestive heart failure) (HCC) Most recent Transthoracic Echocardiogram with LVEF of 40-45% (down from 55%) with global hypokinesis and associated RV dysfunction noted. Patient with acute heart failure this admission that responded to Lasix. Currently resolved. Not on GDMT secondary to recent hypotension. -Continue Lasix  Hypertension - Continue lasix, currently on midodrine  Anxiety and depression Previously on Zoloft and Klonopin which are discontinued.  GERD -Continue Protonix  Atrial fibrillation, chronic (HCC) Patient is on rate/rhythm control and on anticoagulation. -Continue amiodarone and Eliquis  AKI (acute kidney injury) ([place]) Possibly secondary to lasix although now has stabilized. Weight appears to have downtrended from admission but is stable for the past week.  Hypokalemia Replace and follow  Hyperlipidemia -Continue Crestor  Dysphagia Previously with a Cortrak and tube feedings. Evaluated/followed by speech therapy. -Speech therapy recommendations (8/28)  - regular solids, whole meds with puree (see note)  Acute postoperative anemia due to expected blood loss Acute anemia down to 6.5 requiring 2 units of PRBC. Hemoglobin currently stable.  Dysfunction of right cardiac ventricle Acute/chronic PE ruled out. Possibly related to pneumonia. Recommendation for repeat Transthoracic Echocardiogram in 6 months. Patient can follow-up with pulmonology.  Hypernatremia-resolved as of  05/07/2022 Resolved.  Thrombocytopenia (HCC)-resolved as of 05/07/2022 Resolved.        Subjective: Patient with no chest pain or dyspnea  Physical Exam: Vitals:   05/26/22 0345 05/26/22 0742 05/26/22 1143 05/26/22 1452  BP: (!) 118/54 (!) 113/56 119/70 (!) 107/49  Pulse: 91 81 80 88  Resp: '20 19 20 20  '$ Temp: 98.9 F (37.2 C) (!) 97.3 F (36.3 C) (!) 97.3 F (36.3 C) 97.7 F (36.5 C)  TempSrc: Axillary Axillary Oral Oral  SpO2: 93% 94% 93% 98%  Weight:      Height:       Neurology awake and alert, able to respond to simple questions ENT with mild pallor Cardiovascular with S1 and S2 present and rhythmic Respiratory with no rales or wheezing Abdomen mild distended but not tender No lower extremity edema right leg with boot in place.  Data Reviewed:    Family Communication: family present in the room at the time of my visit   Disposition: Status is: Inpatient Remains inpatient appropriate because: per primary team.    Planned Discharge Destination:  to be determined      Author: Tawni Millers, MD 05/26/2022 5:38 PM  For on call review [email or web address].

## 2022-05-26 NOTE — Progress Notes (Signed)
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 s/p tumor resection 7/3. Recent DC from [facility] 6/22. MRI on 7/26 shows: SDH extending inferiorly along the clivus and  narrowing the foramen magnum with possible mass effect. PMH: CKD stage 3, HFpEF, a fib, meningioma.    PT Comments    Pt progressing towards goals. Required use of stedy for transfers this session. Requiring mod A +2 to stand from steady and max A +2 to stand with 2 person HHA. Current recommendations for SNF appropriate, however, family likely to refuse. Recommending max HH services and DME below if family chooses to take pt home. Will continue to follow acutely.     Recommendations for follow up therapy are one component of a multi-disciplinary discharge planning process, led by the attending physician.  Recommendations may be updated based on patient status, additional functional criteria and insurance authorization.  Follow Up Recommendations  Skilled nursing-short term rehab (<3 hours/day) (max HH services if family declines) Can patient physically be transported by private vehicle: No   Assistance Recommended at Discharge Frequent or constant Supervision/Assistance  Patient can return home with the following Two people to help with walking and/or transfers;Two people to help with bathing/dressing/bathroom;Assist for transportation;Assistance with feeding;Assistance with Education officer, environmental (measurements PT);Wheelchair cushion (measurements PT);Hospital bed;Other (comment);BSC/3in1 (hoyer lift)    Recommendations for Other Services       Precautions / Restrictions Precautions Precautions: Fall Precaution Comments: seizures Required Braces or Orthoses: Other Brace Other Brace: bilat resting hand splints,   ankle contracture boot, B Prafos/ankle contracture boot (wearing schedules to be posted in room) Restrictions Weight Bearing Restrictions: No     Mobility  Bed Mobility Overal bed mobility: Needs Assistance Bed Mobility: Sit to Supine       Sit to supine: Max assist, +2 for physical assistance   General bed mobility comments: Assist for trunk and LEs    Transfers Overall transfer level: Needs assistance Equipment used: Ambulation equipment used, 2 person hand held assist Transfers: Sit to/from Stand, Bed to chair/wheelchair/BSC Sit to Stand: Mod assist, Max assist, +2 physical assistance Stand pivot transfers: Total assist (in steady)         General transfer comment: Required assist to place BUE on bar of steady. Stood with mod A +2 initially. On second attempt to stand from bed with 2 person HHA, required max A +2. Transfer via Lift Equipment: Stedy  Ambulation/Gait                   Stairs             Wheelchair Mobility    Modified Rankin (Stroke Patients Only)       Balance Overall balance assessment: Needs assistance Sitting-balance support: No upper extremity supported, Feet supported Sitting balance-Leahy Scale: Poor     Standing balance support: Bilateral upper extremity supported Standing balance-Leahy Scale: Poor Standing balance comment: Reliant on BUE and external support                            Cognition Arousal/Alertness: Awake/alert Behavior During Therapy: Flat affect Overall Cognitive Status: Impaired/Different from baseline Area of Impairment: Attention, Following commands, Safety/judgement, Awareness, Problem solving  Orientation Level: Disoriented to, Place, Time, Situation Current Attention Level: Focused Memory: Decreased recall of precautions, Decreased short-term memory Following Commands: Follows one step commands inconsistently, Follows one step commands with increased  time Safety/Judgement: Decreased awareness of safety, Decreased awareness of deficits Awareness: Intellectual Problem Solving: Slow processing, Decreased initiation, Difficulty sequencing, Requires verbal cues, Requires tactile cues General Comments: Mumbled, repetitive statements throughout that were hard to understand. Following simple cues to stand but requiring increased time.        Exercises Other Exercises Other Exercises: Passive calf stretch X5 bilaterally    General Comments        Pertinent Vitals/Pain Pain Assessment Pain Assessment: Faces Faces Pain Scale: Hurts a little bit Pain Location: generalized Pain Descriptors / Indicators: Grimacing Pain Intervention(s): Limited activity within patient's tolerance, Monitored during session, Repositioned    Home Living                          Prior Function            PT Goals (current goals can now be found in the care plan section) Acute Rehab PT Goals Patient Stated Goal: none stated; no family present PT Goal Formulation: Patient unable to participate in goal setting Time For Goal Achievement: 06/09/22 Potential to Achieve Goals: Fair Progress towards PT goals: Progressing toward goals    Frequency    Min 3X/week      PT Plan Current plan remains appropriate    Co-evaluation PT/OT/SLP Co-Evaluation/Treatment: Yes Reason for Co-Treatment: To address functional/ADL transfers;For patient/therapist safety PT goals addressed during session: Balance;Mobility/safety with mobility        AM-PAC PT "6 Clicks" Mobility   Outcome Measure  Help needed turning from your back to your side while in a flat bed without using bedrails?: A Lot Help needed moving from lying on your back to sitting on the side of a flat bed without using bedrails?: A Lot Help needed moving to and from a bed to a chair (including a wheelchair)?: Total Help needed standing up from a chair using your arms (e.g., wheelchair or  bedside chair)?: Total Help needed to walk in hospital room?: Total Help needed climbing 3-5 steps with a railing? : Total 6 Click Score: 8    End of Session Equipment Utilized During Treatment: Gait belt;Oxygen Activity Tolerance: Patient tolerated treatment well Patient left: in bed;with call bell/phone within reach;with bed alarm set Nurse Communication: Mobility status PT Visit Diagnosis: Other abnormalities of gait and mobility (R26.89);Other symptoms and signs involving the nervous system (R29.898)     Time: 4315-4008 PT Time Calculation (min) (ACUTE ONLY): 28 min  Charges:  $Therapeutic Activity: 8-22 mins                     Lou Miner, DPT  Acute Rehabilitation Services  Office: [phone]    Rudean Hitt 05/26/2022, 2:41 PM

## 2022-05-26 NOTE — Progress Notes (Signed)
Patient care assistant provided by family was with patient for lunch setup and feeding. Care assistant encouraged patient to self feed by placing her cheeseburger in her right. Patient was able to successful feed herself half the burger with no issues. She also held the incentive spirometer herself and used it appropriately.

## 2022-05-27 DIAGNOSIS — R569 Unspecified convulsions: Secondary | ICD-10-CM | POA: Diagnosis not present

## 2022-05-27 LAB — BASIC METABOLIC PANEL
Anion gap: 11 (ref 5–15)
BUN: 20 mg/dL (ref 8–23)
CO2: 18 mmol/L — ABNORMAL LOW (ref 22–32)
Calcium: 8.9 mg/dL (ref 8.9–10.3)
Chloride: 113 mmol/L — ABNORMAL HIGH (ref 98–111)
Creatinine, Ser: 0.99 mg/dL (ref 0.44–1.00)
GFR, Estimated: 59 mL/min — ABNORMAL LOW (ref >60)
Glucose, Bld: 99 mg/dL (ref 70–99)
Potassium: 4.5 mmol/L (ref 3.5–5.1)
Sodium: 142 mmol/L (ref 135–145)

## 2022-05-27 NOTE — Progress Notes (Signed)
Patient ID: Tara [name], female   DOB: 05-20-1946, 76 y.o.   MRN: [id number] BP (!) 117/53 (BP Location: Left Leg)   Pulse 82   Temp 97.8 F (36.6 C) (Oral)   Resp 20   Ht '4\' 9"'$  (1.448 m)   Wt 74.7 kg   SpO2 96%   BMI 35.64 kg/m  Alert, following commands Moving all extremities Family working with therapy and social work.

## 2022-05-27 NOTE — Progress Notes (Signed)
Progress Note   Patient: Tara [name]:[id number] DOB: 10/04/1945 DOA: 03/15/2022     73 DOS: the patient was seen and examined on 05/27/2022   Brief hospital course: 76 year old African-American female PMH of anxiety, depression, type II DM, HLD, PAF, on Eliquis, CKD 3B, breast cancer SP chemoradiation presented to hospital on 6/24 with new onset seizures. Known history of sphenoidal meningioma that was noted to be increased in size on the CT scan with surrounding edema so she is transferred from [facility] for further evaluation.  She underwent LTM with no further seizures and was evaluated by neurosurgery and underwent bifrontal craniotomy for meningioma resection on 03/24/2020 and was transferred to the intensive care unit postoperatively with PCCM consulting while she was in ICU. 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  Treated with Unasyn for 5 days. 7/11 recurrent sz like activity. Not correlated on EEG. CT unchanged.  7/13 neurology signed off. 7/19 started on ABX for sepsis then transition to Unasyn. 7/21 palliative care was consulted. 7/23 CT chest and abdomen was performed negative for PE or significant pneumonia, without any hemorrhage 7/25 MRI brain without contrast shows increasing mass effect, SDH as well as subacute/acute small scattered infarct. 7/26 tube feeds resumed, neurology reconsulted.  Eliquis resumed per neurosurgery 7/27 oxygenation improving, mentation improving, neurology following.  CTA head and neck without any LVO.  Lower extremity Doppler without any DVT.  Echo shows new systolic dysfunction with global hypokinesis EF 40 to 45% and significant RV failure. 7/30 had another aspiration event. Tube feeds on  hold. Now on NRB. Remains full code. No tracheostomy per husband. Per Neurosurgery, give at least two weeks if not more to see if she improves.  8/1 oxygenation improving again to 5 L.  Trickle tube feeds started. 8/9 on D1 honey thick liquid diet.  Calorie count initiated. 8/13 oxygenation improving to 2 L. 8/14 core track removed nocturnal tube feeds stopped. 8/15 leukocytosis worsening, abdomen distended, x-ray abdomen shows evidence of possible sigmoid volvulus, GI consulted, CT abdomen negative for any volvulus and only shows redundant sigmoid colon.  Will resume diet. 8/24 keppra dose decreased 8/26 ritalin started 8/16-present: leukocytosis of unknown etiology persists 08/29 to 08/30 with vomiting.  09/03 continue to make slow progress with moderate cognitive deficits.  09/04 continue to show decent progress, physical therapy continue to recommend SNF however family prefers home health.  Assessment and Plan: * Seizure ([place]) Seizures Continue Keppra, Vimpat EEG 8/24 with moderate diffuse encephalopathy, no seizures or epileptiform discharges PM&R suggested to consider decreasing keppra, Dr. Florene Glen discussed with neurology on-call, Keppra dose decreased to 750 mg twice daily on 8/24 -Needs close follow-up with neurology -She is medically stable for discharge from my standpoint, further DC plans per neurosurgery, diarrhea appears to be patient's family prefers home, declined SNF over -Needs palliative care follow-up  Acute on chronic combined systolic and diastolic CHF, resolved Most recent Transthoracic Echocardiogram with LVEF of 40-45% (down from 55%) with global hypokinesis and associated RV dysfunction noted.  -Currently euvolemic, continue every other day Lasix -Would benefit from sleep study as outpatient  Chest x-ray tomorrow.  Acute respiratory failure with hypoxia (HCC) -secondary to aspiration pneumonia, completed inpatient antibiotic treatment -SLP following, diet  upgraded   Hypotension -Stabilized with midodrine -  According to physical therapy, still has significant fall risk  Meningioma, cerebral (HCC) -Underwent bilateral craniotomy, meningioma resection on 7/3, Dr. Christella Noa -Postop course complicated by seizures, SDH and strokes   Encephalopathy, lethargy PM&R recommended considering ritalin 5 mg twice daily  -Started on low-dose Ritalin 8/26, disc continued this on account of nausea and vomiting -Mental status slowly improving, continues to have frequent lipsmacking,?  Tardive dyskinesia   Nausea and vomiting -Suspect this is secondary to Ritalin, recently discontinued and symptoms resolved.   Leukocytosis -Now improving   Hypertension - Continue every other day Lasix on midodrine for blood pressure support.    Anxiety and depression Previously on Zoloft and Klonopin which were discontinued during her long ICU stay   GERD -Continue Protonix   Paroxysmal atrial fibrillation, chronic (HCC) Patient is on rate/rhythm control and on anticoagulation. -Continue amiodarone and Eliquis   AKI (acute kidney injury) ([place]) -Resolved   Hypokalemia Resolved   Hyperlipidemia -Continue Crestor   Dysphagia Previously with a Cortrak and tube feedings. Evaluated/followed by speech therapy. -Speech therapy recommendations (8/28)     - regular solids, whole meds with puree (see note)   Acute postoperative anemia due to expected blood loss Acute anemia down to 6.5 requiring 2 units of PRBC. Hemoglobin currently stable.   Hypernatremia-resolved as of 05/07/2022 Resolved.   Thrombocytopenia (HCC)-resolved as of 05/07/2022 Resolved.   Hypernatremia-resolved as of 05/07/2022 Resolved.  Thrombocytopenia (HCC)-resolved as of 05/07/2022 Resolved.        Subjective:  Subjective:  Eyes: PERRL, lids and conjunctivae normal ENMT: Mucous membranes are moist. Posterior pharynx clear of any exudate or lesions.Normal dentition.  Neck: normal,  supple, no masses, no thyromegaly Respiratory: clear to auscultation bilaterally, no wheezing, no crackles. Normal respiratory effort. No accessory muscle use.  Cardiovascular: Regular rate and rhythm, no murmurs / rubs / gallops. No extremity edema. 2+ pedal pulses. No carotid bruits.  Abdomen: no tenderness, no masses palpated. No hepatosplenomegaly. Bowel sounds positive.  Musculoskeletal: no clubbing / cyanosis. No joint deformity upper and lower extremities. Good ROM, no contractures. Normal muscle tone.  Skin: no rashes, lesions, ulcers. No induration Neurologic: No facial droops, moving all limbs, following simple commands Psychiatric: Awake, mentation at baseline  Physical Exam: Vitals:   05/27/22 0710 05/27/22 0757 05/27/22 0850 05/27/22 1105  BP: (!) 99/55 101/60  128/76  Pulse: 79 82 80 83  Resp: '18 20 19 '$ (!) 23  Temp: 97.9 F (36.6 C)   98.1 F (36.7 C)  TempSrc: Axillary   Axillary  SpO2: 99% 99% 95% 95%  Weight:      Height:        Data Reviewed:  There are no new results to review at this time.  Family Communication: Husband at bedside  Disposition: Status is: Inpatient Remains inpatient appropriate because: Complicated Home health needs before discharging home  Planned Discharge Destination: Home with Home Health    Time spent: 38 minutes  Author: Lequita Halt, MD 05/27/2022 1:51 PM  For on call review [email or web address].

## 2022-05-28 ENCOUNTER — Inpatient Hospital Stay (HOSPITAL_COMMUNITY): Payer: Medicare Other

## 2022-05-28 DIAGNOSIS — R569 Unspecified convulsions: Secondary | ICD-10-CM | POA: Diagnosis not present

## 2022-05-28 DIAGNOSIS — D320 Benign neoplasm of cerebral meninges: Secondary | ICD-10-CM | POA: Diagnosis not present

## 2022-05-28 DIAGNOSIS — D72822 Plasmacytosis: Secondary | ICD-10-CM | POA: Diagnosis not present

## 2022-05-28 DIAGNOSIS — R5383 Other fatigue: Secondary | ICD-10-CM | POA: Diagnosis not present

## 2022-05-28 MED ORDER — PANTOPRAZOLE SODIUM 40 MG PO TBEC
40.0000 mg | DELAYED_RELEASE_TABLET | Freq: Every day | ORAL | Status: DC
  Administered 2022-05-29 – 2022-06-10 (×13): 40 mg via ORAL
  Filled 2022-05-28 (×13): qty 1

## 2022-05-28 NOTE — Progress Notes (Signed)
Progress Note   Patient: Tara [name]:[id number] DOB: September 13, 1946 DOA: 03/15/2022     74 DOS: the patient was seen and examined on 05/28/2022   Brief hospital course: 76 year old African-American female PMH of anxiety, depression, type II DM, HLD, PAF, on Eliquis, CKD 3B, breast cancer SP chemoradiation presented to hospital on 6/24 with new onset seizures. Known history of sphenoidal meningioma that was noted to be increased in size on the CT scan with surrounding edema so she is transferred from [facility] for further evaluation.  She underwent LTM with no further seizures and was evaluated by neurosurgery and underwent bifrontal craniotomy for meningioma resection on 03/24/2020 and was transferred to the intensive care unit postoperatively with PCCM consulting while she was in ICU. 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  Treated with Unasyn for 5 days. 7/11 recurrent sz like activity. Not correlated on EEG. CT unchanged.  7/13 neurology signed off. 7/19 started on ABX for sepsis then transition to Unasyn. 7/21 palliative care was consulted. 7/23 CT chest and abdomen was performed negative for PE or significant pneumonia, without any hemorrhage 7/25 MRI brain without contrast shows increasing mass effect, SDH as well as subacute/acute small scattered infarct. 7/26 tube feeds resumed, neurology reconsulted.  Eliquis resumed per neurosurgery 7/27 oxygenation improving, mentation improving, neurology following.  CTA head and neck without any LVO.  Lower extremity Doppler without any DVT.  Echo shows new systolic dysfunction with global hypokinesis EF 40 to 45% and significant RV failure. 7/30 had another aspiration event. Tube feeds on  hold. Now on NRB. Remains full code. No tracheostomy per husband. Per Neurosurgery, give at least two weeks if not more to see if she improves.  8/1 oxygenation improving again to 5 L.  Trickle tube feeds started. 8/9 on D1 honey thick liquid diet.  Calorie count initiated. 8/13 oxygenation improving to 2 L. 8/14 core track removed nocturnal tube feeds stopped. 8/15 leukocytosis worsening, abdomen distended, x-ray abdomen shows evidence of possible sigmoid volvulus, GI consulted, CT abdomen negative for any volvulus and only shows redundant sigmoid colon.  Will resume diet. 8/24 keppra dose decreased 8/26 ritalin started 8/16-present: leukocytosis of unknown etiology persists 08/29 to 08/30 with vomiting.  09/03 continue to make slow progress with moderate cognitive deficits.  09/06 patient has been medically stable. Pending disposition. Her daughter will like to take her home when stable from neurosurgical point   Assessment and Plan: * Seizure ([place]) Seizures Continue Keppra, Vimpat EEG 8/24 with moderate diffuse encephalopathy, no seizures or epileptiform discharges PM&R suggested to consider decreasing keppra, Dr. Florene Glen discussed with neurology on-call, Keppra dose decreased to 750 mg twice daily on 8/24 -Needs close follow-up with neurology -She is medically stable for discharge from my standpoint, further DC plans per neurosurgery,     Meningioma, cerebral ([place]) -Underwent bilateral craniotomy, meningioma resection on 7/3, Dr. Christella Noa -Postop course complicated by seizures, SDH and strokes Her cognition and neurological function have been stable.    Encephalopathy, lethargy PM&R recommended considering ritalin 5 mg twice daily  -Started on low-dose Ritalin 8/26, disc continued this on account of nausea and vomiting -Mental status slowly improving, continues to have frequent lipsmacking,(Tardive dyskinesia)  Her mentation has been stable, continue working with physical therapy.  Nausea and vomiting has resolved    Leukocytosis has been stable with wbc at 11,4 no signs of systemic bacterial infection    Acute respiratory failure with hypoxia (HCC) -Improved, completed ABX for aspiration pneumonia -SLP following, diet upgraded  Continue with aspiration precautions.    Acute on chronic combined systolic and diastolic CHF Most recent Transthoracic Echocardiogram with LVEF of 40-45% (down from 55%) with global hypokinesis and associated RV dysfunction noted.  -Currently euvolemic, continue Lasix -Would benefit from sleep study as outpatient  Patient off diuretic therapy. Blood pressure and volume status are sable.  Hypertension - Continue furosemide for diuresis  on midodrine for blood pressure support.    Anxiety and depression Previously on Zoloft and Klonopin which were discontinued during her long ICU stay   GERD -Continue Protonix   Atrial fibrillation, chronic ([place]) Patient is on rate/rhythm control and on anticoagulation. -Continue amiodarone for rate control and anticoagulation with apixaban.    AKI (acute kidney injury) ([place]) -Resolved   Hypokalemia/ hypernatremia  Stable renal function and K has been corrected at 4,5  Na 142 yesterday.     Hyperlipidemia -Continue Crestor   Dysphagia Previously with a Cortrak and tube feedings. Evaluated/followed by speech therapy. -Speech therapy recommendations (8/28)     - regular solids, whole meds with puree (see note)   Acute postoperative anemia due to expected blood loss Acute anemia down to 6.5 requiring 2 units of PRBC. Hemoglobin currently stable.   Thrombocytopenia (HCC)-resolved as of 05/07/2022 Resolved.  Meningioma, cerebral [facility]) Per neurosurgery  Lethargy PM&R recommending considering ritalin 5 mg twice daily  I discussed informally with cards, in absence of RVR and decompensated HF can consider this - started 8/26, will follow response  Leukocytosis Recurrent and  persistent. Know specific infectious source identified although patient has been recently treated for recurrent aspiration pneumonia. Recently completed course of unasyn 8/3 CXR 8/19 concerning for edema vs pneumia repeat CXR without acute abnormality, low lung volumes  Acute respiratory failure with hypoxia (HCC) Secondary to pneumonia with associated tachypnea. Hypoxia improved but not resolved. Repeat chest x-ray with edema vs pneumonia. Patient with leukocytosis and no other infectious symptoms. Down to 1 L/min with Lasix. -Wean as able -repeat CXR with low lung volumes, no acute abnormality  Acute on chronic combined systolic and diastolic CHF (congestive heart failure) (HCC) Most recent Transthoracic Echocardiogram with LVEF of 40-45% (down from 55%) with global hypokinesis and associated RV dysfunction noted. Patient with acute heart failure this admission that responded to Lasix. Currently resolved. Not on GDMT secondary to recent hypotension. -Continue Lasix  Hypertension - Continue lasix, currently on midodrine  Anxiety and depression Previously on Zoloft and Klonopin which are discontinued.  GERD -Continue Protonix  Atrial fibrillation, chronic (HCC) Patient is on rate/rhythm control and on anticoagulation. -Continue amiodarone and Eliquis  AKI (acute kidney injury) ([place]) Possibly secondary to lasix although now has stabilized. Weight appears to have downtrended from admission but is stable for the past week.  Hypokalemia Replace and follow  Hyperlipidemia -Continue Crestor  Dysphagia Previously with a Cortrak and tube feedings. Evaluated/followed by speech therapy. -Speech therapy recommendations (8/28)  - regular solids, whole meds with puree (see note)  Acute postoperative anemia due to expected blood loss Acute anemia down to 6.5 requiring 2 units of PRBC. Hemoglobin currently stable.  Dysfunction of right cardiac ventricle Acute/chronic PE ruled out.  Possibly related to pneumonia. Recommendation for repeat Transthoracic Echocardiogram in 6 months. Patient can follow-up with pulmonology.  Hypernatremia-resolved  as of 05/07/2022 Resolved.  Thrombocytopenia (HCC)-resolved as of 05/07/2022 Resolved.        Subjective: Patient is out of bed to the chair with the help of physical therapy, her daughter is at the bedside. Patient with no chest pain or dyspnea   Physical Exam: Vitals:   05/28/22 1135 05/28/22 1200 05/28/22 1452 05/28/22 1919  BP: 110/67  (!) 115/59 108/62  Pulse: 81 81 86 90  Resp: 20 20 (!) 24 (!) 23  Temp: 98.2 F (36.8 C)  98 F (36.7 C) 98.6 F (37 C)  TempSrc: Oral  Oral Oral  SpO2: 92% 91% 92% 92%  Weight:      Height:       Neurology awake and alert, continue to be very weak and deconditioned ENT with mild pallor Cardiovascular with S1 and S2 present with no gallops Respiratory with no rales or wheezing Abdomen with no distention  No lower extremity edema   Data Reviewed:    Family Communication: I spoke with patient's dauhgter at the bedside, we talked in detail about patient's condition, plan of care and prognosis and all questions were addressed.   Disposition: Status is: Inpatient Remains inpatient appropriate because: pending final disposition   Planned Discharge Destination: Home with Home Health  Author: Tawni Millers, MD 05/28/2022 9:35 PM  For on call review [email or web address].

## 2022-05-28 NOTE — Progress Notes (Signed)
Physical Therapy Treatment Patient Details Name: Tara [name] MRN: [id number] DOB: October 11, 1945 Today's Date: 05/28/2022   History of Present Illness 76 yo woman admitted 03/15/22 with new onset seizures related to meningioma. s/p tumor resection 7/3. Recent DC from [facility] 6/22. MRI on 7/26 shows: SDH extending inferiorly along the clivus and  narrowing the foramen magnum with possible mass effect. PMH: CKD stage 3, HFpEF, a fib, meningioma.    PT Comments    Pt is making great progress, standing in the stedy with minA today and performing multiple sit <> stands with HHA and using RW with modA. She remains limited in endurance and strength and was unable to try to take steps today. Educated pt's daughter on safe and proper positioning to assist pt with bed mobility and transfers with HHA, RW, and stedy utilization. Educated daughter on splint wearing schedule and on exercises to assist in stretching/strengthening pt's ankles. Planning another family education session, hopefully tomorrow. Will continue to follow acutely. Current recommendations remain appropriate.    Recommendations for follow up therapy are one component of a multi-disciplinary discharge planning process, led by the attending physician.  Recommendations may be updated based on patient status, additional functional criteria and insurance authorization.  Follow Up Recommendations  Skilled nursing-short term rehab (<3 hours/day) (max HH services if family declines) Can patient physically be transported by private vehicle: No   Assistance Recommended at Discharge Frequent or constant Supervision/Assistance  Patient can return home with the following Two people to help with walking and/or transfers;Two people to help with bathing/dressing/bathroom;Assist for transportation;Assistance with feeding;Assistance with cooking/housework;Direct supervision/assist for medications management;Direct supervision/assist for financial management;Help  with stairs or ramp for entrance   Equipment Recommendations  Wheelchair (measurements PT);Wheelchair cushion (measurements PT);Hospital bed;Other (comment);BSC/3in1 (hoyer lift; stedy; drop-arm w/c and bedside commode)    Recommendations for Other Services       Precautions / Restrictions Precautions Precautions: Fall Precaution Comments: seizures Required Braces or Orthoses: Other Brace Other Brace: bilat resting hand splints,  ankle contracture boot, B Prafos/ankle contracture boot (wearing schedules to be posted in room) Restrictions Weight Bearing Restrictions: No     Mobility  Bed Mobility Overal bed mobility: Needs Assistance Bed Mobility: Supine to Sit, Rolling Rolling: Min assist, Mod assist   Supine to sit: Max assist, HOB elevated     General bed mobility comments: Pt cued to bring legs off L EOB, with pt moving R leg better than L. MaxA with pt pulling on therapist's hand to ascend trunk, using bed pad to pivot and scoot hips to EOB. Min/modA with cues for guidance to roll.    Transfers Overall transfer level: Needs assistance Equipment used: Ambulation equipment used, 1 person hand held assist, Rolling walker (2 wheels) Transfers: Sit to/from Stand, Bed to chair/wheelchair/BSC Sit to Stand: Mod assist, Min assist Stand pivot transfers: Total assist (in steady)         General transfer comment: Pt pulling self to stand in stedy with minA, >5x, needing repeated cues to place hands on blue bar. ModA for pt to come to stand from recliner to RW 2x and to [facility] holding onto therapist in anterior approach 2x. Educated pt's daughter throughout session on how to position self and provide assistance using gait belt. Transfer via Lift Equipment: Stedy  Ambulation/Gait               General Gait Details: unable to take steps when cued   Stairs  Wheelchair Mobility    Modified Rankin (Stroke Patients Only) Modified Rankin (Stroke Patients  Only) Pre-Morbid Rankin Score: Moderately severe disability Modified Rankin: Severe disability     Balance Overall balance assessment: Needs assistance Sitting-balance support: Feet supported, Single extremity supported, Bilateral upper extremity supported Sitting balance-Leahy Scale: Poor Sitting balance - Comments: Reliant on UE support or external physical assistance of up to modA due to posterior lean. Postural control: Posterior lean Standing balance support: Bilateral upper extremity supported Standing balance-Leahy Scale: Poor Standing balance comment: Reliant on BUE and external support                            Cognition Arousal/Alertness: Awake/alert Behavior During Therapy: Flat affect Overall Cognitive Status: Impaired/Different from baseline Area of Impairment: Attention, Following commands, Safety/judgement, Awareness, Problem solving, Memory                   Current Attention Level: Focused Memory: Decreased recall of precautions, Decreased short-term memory Following Commands: Follows one step commands inconsistently, Follows one step commands with increased time Safety/Judgement: Decreased awareness of safety, Decreased awareness of deficits Awareness: Intellectual Problem Solving: Slow processing, Decreased initiation, Difficulty sequencing, Requires verbal cues, Requires tactile cues General Comments: Mumbled, repetitive statements, often copying therapist. Pt needing repeated simple multi-modal cues for all tasks and sequencing. Poor awareness into deficits/safety, trying to pull to stand prior to being cued to do so.        Exercises      General Comments General comments (skin integrity, edema, etc.): SpO2 dropping to 80s% on RA, recovered with resting and cues for breathing; extensive education provided to pt's daughter on positioning of herself, cuing pt, and providing assistance to pt with bed mobility and transfers. Educated daughter  on splint wearing schedule and on how to assist in strengthening pt's anterior tibialis and stretch her gastrocs      Pertinent Vitals/Pain Pain Assessment Pain Assessment: Faces Faces Pain Scale: Hurts a little bit Pain Location: generalized Pain Descriptors / Indicators: Grimacing Pain Intervention(s): Limited activity within patient's tolerance, Monitored during session, Repositioned    Home Living                          Prior Function            PT Goals (current goals can now be found in the care plan section) Acute Rehab PT Goals Patient Stated Goal: to improve PT Goal Formulation: With family Time For Goal Achievement: 06/09/22 Potential to Achieve Goals: Fair Progress towards PT goals: Progressing toward goals    Frequency    Min 3X/week      PT Plan Equipment recommendations need to be updated    Co-evaluation              AM-PAC PT "6 Clicks" Mobility   Outcome Measure  Help needed turning from your back to your side while in a flat bed without using bedrails?: A Lot Help needed moving from lying on your back to sitting on the side of a flat bed without using bedrails?: A Lot Help needed moving to and from a bed to a chair (including a wheelchair)?: Total Help needed standing up from a chair using your arms (e.g., wheelchair or bedside chair)?: A Lot Help needed to walk in hospital room?: Total Help needed climbing 3-5 steps with a railing? : Total 6 Click Score: 9  End of Session Equipment Utilized During Treatment: Gait belt Activity Tolerance: Patient tolerated treatment well Patient left: with call bell/phone within reach;in chair;with chair alarm set;with family/visitor present   PT Visit Diagnosis: Other abnormalities of gait and mobility (R26.89);Other symptoms and signs involving the nervous system (R29.898);Unsteadiness on feet (R26.81);Muscle weakness (generalized) (M62.81);Difficulty in walking, not elsewhere classified  (R26.2)     Time: 1700-1741 PT Time Calculation (min) (ACUTE ONLY): 41 min  Charges:  $Therapeutic Activity: 38-52 mins                     Moishe Spice, PT, DPT Acute Rehabilitation Services  Office: [phone]    Orvan Falconer 05/28/2022, 5:50 PM

## 2022-05-28 NOTE — Progress Notes (Signed)
Patient ID: Tara [name], female   DOB: [birth year], 76 y.o.   MRN: [id number] BP (!) 115/59 (BP Location: Left Leg)   Pulse 86   Temp 98 F (36.7 C) (Oral)   Resp (!) 24   Ht '4\' 9"'$  (1.448 m)   Wt 74.7 kg   SpO2 92%   BMI 35.64 kg/m  Alert No changes Will work with family about discharge

## 2022-05-28 NOTE — Progress Notes (Signed)
Speech Language Pathology Treatment: Dysphagia  Patient Details Name: Tara [name] MRN: [id number] DOB: [birth year] Today's Date: 05/28/2022 Time: 1050-1105 SLP Time Calculation (min) (ACUTE ONLY): 15 min  Assessment / Plan / Recommendation Clinical Impression  Patient seen by SLP for skilled treatment with focus on dysphagia goals. She was awake and alert, sitting in recliner with no family present at this time. SLP observed her with cup and straw sips of water, with patient able to hold cup to take small sips. No overt s/s aspiration or penetration observed. As patient has had two MBS's this admission with improvement from honey thick to nectar thick on 05/19/22 MBS, SLP recommending repeat MBS to determine if patient could upgrade to thin liquids. MBS ordered and scheduled with SLP today.   HPI HPI: Pt is a 76 y/o who presented 6/24 with new onset seizures. 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 EEG negative for seizures. Neurosurgey consulted and did not recommend meningioma resection as of 6/27. Decline in swallow function noted overnight 6/27. Dx acute toxic metabolic encephalopathy suspected secondary to keppra. Pt s/p Bifrontal craniotomy for resection of meningioma 7/3.  Cortrak 7/7. 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  CXR 8/6 concerning for patchy airspace disease. PMH: HTN, HLD, GERD, CKD stage IIIb, chronic HFpEF, meningioma, breast CA-s/p right lumpectomy and chemoradiation in 2008, depression/anxiety. BSE 03/11/22: regular texture diet and thin liquids without need for follow up. MBS 8/8 recommended Dys 1, honey thick and repeat MBS on 05/19/22 recommending upgrade to regular solids and nectar thick liquids.      SLP Plan  Continue with current plan of care      Recommendations for follow up therapy are one component of a multi-disciplinary discharge planning process, led by the attending physician.  Recommendations may be updated  based on patient status, additional functional criteria and insurance authorization.    Recommendations  Diet recommendations: Regular;Nectar-thick liquid Liquids provided via: Cup;Straw Medication Administration: Crushed with puree Supervision: Full supervision/cueing for compensatory strategies Compensations: Slow rate;Small sips/bites;Clear throat intermittently Postural Changes and/or Swallow Maneuvers: Seated upright 90 degrees                Oral Care Recommendations: Oral care BID Follow Up Recommendations: Other (comment) (patient does not want SNF and plans to take home; [facility] SLP recommended) Assistance recommended at discharge: Frequent or constant Supervision/Assistance SLP Visit Diagnosis: Dysphagia, oropharyngeal phase (R13.12) Plan: Continue with current plan of care          Sonia Baller, MA, CCC-SLP Speech Therapy

## 2022-05-28 NOTE — Progress Notes (Signed)
Modified Barium Swallow Progress Note  Patient Details  Name: Tara [name] MRN: [id number] Date of Birth: [birth year]  Today's Date: 05/28/2022  Modified Barium Swallow completed.  Full report located under Chart Review in the Imaging Section.  Brief recommendations include the following:  Clinical Impression  Patient exhibits improved swallow function as compared to previous MBS on 05/19/22. Focus of today's MBS was to determine readiness for upgrade from nectar thick liquids. SLP tested patient with: thin barium, ultra thin barium, regular solids. She exhibited mild oral transit delays with solids but with full clearance and no delays in mastication. During pharyngeal phase, swallow initiation of solids was at level of vallecular sinus and min-mod vallecular residuals remained after initial swallow but cleared with subsequent swallows. When taking sips of thin liquids and ultra thin liquids, swallow was initiated at level of pyriform sinus and intermittent flash penetration observed. (full cleared laryngeal vestibule. She exhibited one instance of trace penetration to the vocal cords that was not observed to clear and was not sensed by patient. Subsequent swallows of thin liquid barium, even large volume sips from straw, did not elicit penetration to vocal cords. SLP is recommending to upgrade liquids to thin and continue with regular solids. Patient continues to require full supervision and assistance with PO's.   Swallow Evaluation Recommendations       SLP Diet Recommendations: Regular solids;Thin liquid   Liquid Administration via: Cup;Straw   Medication Administration: Whole meds with puree   Supervision: Staff to assist with self feeding;Full supervision/cueing for compensatory strategies   Compensations: Slow rate;Small sips/bites;Clear throat intermittently   Postural Changes: Seated upright at 90 degrees   Oral Care Recommendations: Oral care BID        Sonia Baller,  MA, CCC-SLP Speech Therapy

## 2022-05-29 DIAGNOSIS — I5043 Acute on chronic combined systolic (congestive) and diastolic (congestive) heart failure: Secondary | ICD-10-CM | POA: Diagnosis not present

## 2022-05-29 DIAGNOSIS — D320 Benign neoplasm of cerebral meninges: Secondary | ICD-10-CM | POA: Diagnosis not present

## 2022-05-29 DIAGNOSIS — R569 Unspecified convulsions: Secondary | ICD-10-CM | POA: Diagnosis not present

## 2022-05-29 DIAGNOSIS — I10 Essential (primary) hypertension: Secondary | ICD-10-CM | POA: Diagnosis not present

## 2022-05-29 LAB — BASIC METABOLIC PANEL
Anion gap: 9 (ref 5–15)
BUN: 21 mg/dL (ref 8–23)
CO2: 23 mmol/L (ref 22–32)
Calcium: 8.9 mg/dL (ref 8.9–10.3)
Chloride: 107 mmol/L (ref 98–111)
Creatinine, Ser: 1.06 mg/dL — ABNORMAL HIGH (ref 0.44–1.00)
GFR, Estimated: 54 mL/min — ABNORMAL LOW (ref >60)
Glucose, Bld: 96 mg/dL (ref 70–99)
Potassium: 3.6 mmol/L (ref 3.5–5.1)
Sodium: 139 mmol/L (ref 135–145)

## 2022-05-29 MED ORDER — POTASSIUM CHLORIDE CRYS ER 20 MEQ PO TBCR
20.0000 meq | EXTENDED_RELEASE_TABLET | Freq: Every day | ORAL | Status: DC
Start: 2022-05-29 — End: 2022-05-30
  Administered 2022-05-29 – 2022-05-30 (×2): 20 meq via ORAL
  Filled 2022-05-29 (×2): qty 1

## 2022-05-29 MED ORDER — SERTRALINE HCL 50 MG PO TABS
25.0000 mg | ORAL_TABLET | Freq: Every day | ORAL | Status: DC
  Administered 2022-05-29 – 2022-06-10 (×13): 25 mg via ORAL
  Filled 2022-05-29 (×14): qty 1

## 2022-05-29 NOTE — Progress Notes (Signed)
Physical Therapy Treatment Patient Details Name: Tara [name] MRN: [id number] DOB: 1946/01/08 Today's Date: 05/29/2022   History of Present Illness 76 yo woman admitted 03/15/22 with new onset seizures related to meningioma. s/p tumor resection 7/3. Recent DC from [facility] 6/22. MRI on 7/26 shows: SDH extending inferiorly along the clivus and  narrowing the foramen magnum with possible mass effect. PMH: CKD stage 3, HFpEF, a fib, meningioma.    PT Comments    Per Case Manager's note on 05/26/22: Daughter wanting "to focus on three goals prior to discharge. Daughter goals are 1. She wants patient diet to be advanced as much as possible to regular diet with thin liquids." 2. Daughter wants pt completely weaned off O2, and "3. Daughter wants patient to be able to stand and pivot".  Pt is currently on a thin liquid diet and maintaining good SpO2 levels on RA. Focused today's session on providing family education while practicing transfers with pt. Husband, granddaughter, and caregiver friend were able to practice providing assistance for sit <> stand transfers, placing hoyer pad if the hoyer lift is need, and donning splints while extensive education and cuing were provided. Educated them on proper body mechanics and splint wearing schedules. Also, provided info on using a stedy if able for reduced caregiver burden. Pt was able to transfer to stand with modA and bil UE support, but needed modAx2 for stand pivot transfers. Will plan to focus future sessions on pt and family education on stand pivot transfers to address all 3 of the daughter's desired goals to allow pt to d/c home with family support if they decline a SNF. Will continue to follow acutely.        Recommendations for follow up therapy are one component of a multi-disciplinary discharge planning process, led by the attending physician.  Recommendations may be updated based on patient status, additional functional criteria and insurance  authorization.  Follow Up Recommendations  Skilled nursing-short term rehab (<3 hours/day) (max HH services if family declines) Can patient physically be transported by private vehicle: No   Assistance Recommended at Discharge Frequent or constant Supervision/Assistance  Patient can return home with the following Two people to help with walking and/or transfers;Two people to help with bathing/dressing/bathroom;Assist for transportation;Assistance with feeding;Assistance with cooking/housework;Direct supervision/assist for medications management;Direct supervision/assist for financial management;Help with stairs or ramp for entrance   Equipment Recommendations  Wheelchair (measurements PT);Wheelchair cushion (measurements PT);Hospital bed;Other (comment);BSC/3in1 (hoyer lift; stedy; drop-arm w/c and bedside commode)    Recommendations for Other Services       Precautions / Restrictions Precautions Precautions: Fall Precaution Comments: seizures Required Braces or Orthoses: Other Brace Other Brace: bilat resting hand splints,  ankle contracture boot, B Prafos/ankle contracture boot (wearing schedules posted in room) Restrictions Weight Bearing Restrictions: No     Mobility  Bed Mobility Overal bed mobility: Needs Assistance Bed Mobility: Sit to Supine       Sit to supine: Max assist, +2 for physical assistance, +2 for safety/equipment, HOB elevated   General bed mobility comments: MaxAx2 to control trunk and lift legs for return to supine. Cues provided for UE reach to rail or HHA to pull on and to push through ipsilateral leg to roll either direction, modA. Pt's husband and granddaughter practicing assisting with rolls to place and remove hoyer pad.    Transfers Overall transfer level: Needs assistance Equipment used: 1 person hand held assist, 2 person hand held assist Transfers: Sit to/from Stand, Bed to chair/wheelchair/BSC Sit to Stand:  Mod assist, +2  safety/equipment Stand pivot transfers: Mod assist, +2 physical assistance         General transfer comment: Mod A for power up and weight shift forward. Mod A +2 for pivot. Letting husband, caregiver, and granddaughter practice sit<>stand for +1 assistance. Caregiver also practiced stand pivot to R recliner > bed with +2 HHA with therapist as second person. Educated family on letting pt place hands around their waists, not necks, or on their biceps to help pull up to stand and to control pt's buttocks/hips to pivot. Educated them and provided cues for placing their legs to provide knee blocks as needed.    Ambulation/Gait               General Gait Details: unable   Stairs             Wheelchair Mobility    Modified Rankin (Stroke Patients Only) Modified Rankin (Stroke Patients Only) Pre-Morbid Rankin Score: Moderately severe disability Modified Rankin: Severe disability     Balance Overall balance assessment: Needs assistance Sitting-balance support: Feet supported, Single extremity supported, Bilateral upper extremity supported Sitting balance-Leahy Scale: Poor Sitting balance - Comments: Reliant on UE support and min guard-minA Postural control: Posterior lean Standing balance support: Bilateral upper extremity supported Standing balance-Leahy Scale: Poor Standing balance comment: Reliant on BUE and external support                            Cognition Arousal/Alertness: Awake/alert Behavior During Therapy: Flat affect Overall Cognitive Status: Impaired/Different from baseline Area of Impairment: Attention, Following commands, Safety/judgement, Awareness, Problem solving, Memory                 Orientation Level: Disoriented to, Place, Time, Situation Current Attention Level: Sustained Memory: Decreased recall of precautions, Decreased short-term memory Following Commands: Follows one step commands inconsistently, Follows one step  commands with increased time Safety/Judgement: Decreased awareness of safety, Decreased awareness of deficits Awareness: Intellectual Problem Solving: Slow processing, Decreased initiation, Difficulty sequencing, Requires verbal cues, Requires tactile cues General Comments: Pt speaking one word responses majority of time and able to tell us names of family members present today. Pt perseverating. Needs repeated multi-modal simple step-by-step cues for all tasks. Extra time for processing required.        Exercises      General Comments General comments (skin integrity, edema, etc.): SpO2 94% on RA with good pleth throughout session. May have dropped to mid/high 80s% with poor pleth but if so it recovered quickly when good pleth would be present again. Husband, granddaughter, and caregiver/CNA present throughout and educated on splint wearing schedule and application, use of stedy, use of hoyer lift, and how to properly and safely provide assistance to pt with mobility using variety of machines or HHA. Provided them with a gait belt. Educated them verbally on how to improve her endurance and muscular strength. Provided info on sara stedy. They reported no further questions at this time.      Pertinent Vitals/Pain Pain Assessment Pain Assessment: Faces Faces Pain Scale: Hurts a little bit Pain Location: generalized Pain Descriptors / Indicators: Grimacing Pain Intervention(s): Limited activity within patient's tolerance, Monitored during session, Repositioned    Home Living                          Prior Function            PT  Goals (current goals can now be found in the care plan section) Acute Rehab PT Goals Patient Stated Goal: to walk per husband PT Goal Formulation: With patient/family Time For Goal Achievement: 06/09/22 Potential to Achieve Goals: Fair Progress towards PT goals: Progressing toward goals    Frequency    Min 3X/week      PT Plan Current plan  remains appropriate    Co-evaluation PT/OT/SLP Co-Evaluation/Treatment: Yes Reason for Co-Treatment: Complexity of the patient's impairments (multi-system involvement);Necessary to address cognition/behavior during functional activity;For patient/therapist safety;To address functional/ADL transfers PT goals addressed during session: Mobility/safety with mobility;Balance;Proper use of DME;Strengthening/ROM OT goals addressed during session: ADL's and self-care      AM-PAC PT "6 Clicks" Mobility   Outcome Measure  Help needed turning from your back to your side while in a flat bed without using bedrails?: A Lot Help needed moving from lying on your back to sitting on the side of a flat bed without using bedrails?: Total Help needed moving to and from a bed to a chair (including a wheelchair)?: Total Help needed standing up from a chair using your arms (e.g., wheelchair or bedside chair)?: A Lot Help needed to walk in hospital room?: Total Help needed climbing 3-5 steps with a railing? : Total 6 Click Score: 8    End of Session Equipment Utilized During Treatment: Gait belt Activity Tolerance: Patient tolerated treatment well Patient left: in bed;with call bell/phone within reach;with bed alarm set;with family/visitor present   PT Visit Diagnosis: Other abnormalities of gait and mobility (R26.89);Other symptoms and signs involving the nervous system (R29.898);Unsteadiness on feet (R26.81);Muscle weakness (generalized) (M62.81);Difficulty in walking, not elsewhere classified (R26.2)     Time: 7014-1030 PT Time Calculation (min) (ACUTE ONLY): 54 min  Charges:  $Therapeutic Activity: 23-37 mins                     Moishe Spice, PT, DPT Acute Rehabilitation Services  Office: [phone]    Orvan Falconer 05/29/2022, 2:50 PM

## 2022-05-29 NOTE — Progress Notes (Addendum)
Progress Note   Patient: Tara [name] CHY:[id number] DOB: 1946/07/03 DOA: 03/15/2022     76 DOS: the patient was seen and examined on 05/29/2022   Brief hospital course: 76 year old African-American female PMH of anxiety, depression, type II DM, HLD, PAF, on Eliquis, CKD 3B, breast cancer SP chemoradiation presented to hospital on 6/24 with new onset seizures. Known history of sphenoidal meningioma that was noted to be increased in size on the CT scan with surrounding edema so she is transferred from [facility] for further evaluation.  She underwent LTM with no further seizures and was evaluated by neurosurgery and underwent bifrontal craniotomy for meningioma resection on 03/24/2020 and was transferred to the intensive care unit postoperatively with PCCM consulting while she was in ICU. 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  Treated with Unasyn for 5 days. 7/11 recurrent sz like activity. Not correlated on EEG. CT unchanged.  7/13 neurology signed off. 7/19 started on ABX for sepsis then transition to Unasyn. 7/21 palliative care was consulted. 7/23 CT chest and abdomen was performed negative for PE or significant pneumonia, without any hemorrhage 7/25 MRI brain without contrast shows increasing mass effect, SDH as well as subacute/acute small scattered infarct. 7/26 tube feeds resumed, neurology reconsulted.  Eliquis resumed per neurosurgery 7/27 oxygenation improving, mentation improving, neurology following.  CTA head and neck without any LVO.  Lower extremity Doppler without any DVT.  Echo shows new systolic dysfunction with global hypokinesis EF 40 to 45% and significant RV failure. 7/30 had another aspiration event. Tube feeds on  hold. Now on NRB. Remains full code. No tracheostomy per husband. Per Neurosurgery, give at least two weeks if not more to see if she improves.  8/1 oxygenation improving again to 5 L.  Trickle tube feeds started. 8/9 on D1 honey thick liquid diet.  Calorie count initiated. 8/13 oxygenation improving to 2 L. 8/14 core track removed nocturnal tube feeds stopped. 8/15 leukocytosis worsening, abdomen distended, x-ray abdomen shows evidence of possible sigmoid volvulus, GI consulted, CT abdomen negative for any volvulus and only shows redundant sigmoid colon.  Will resume diet. 8/24 keppra dose decreased 8/26 ritalin started 8/16-present: leukocytosis of unknown etiology persists 08/29 to 08/30 with vomiting.  09/03 continue to make slow progress with moderate cognitive deficits.  09/06 patient has been medically stable. Pending disposition. Her daughter will like to take her home when stable from neurosurgical point   Assessment and Plan: * Seizure ([place]) Seizures Continue Keppra, Vimpat EEG 8/24 with moderate diffuse encephalopathy, no seizures or epileptiform discharges PM&R suggested to consider decreasing keppra, Dr. Florene Glen discussed with neurology on-call, Keppra dose decreased to 750 mg twice daily on 8/24 -Needs close follow-up with neurology -She is medically stable for discharge.    Meningioma, cerebral (HCC) -Underwent bilateral craniotomy, meningioma resection on 7/3, Dr. Christella Noa -Postop course complicated by seizures, SDH and strokes Her cognition and neurological function have been stable.    Encephalopathy, lethargy PM&R recommended considering ritalin 5 mg twice daily  -Started on low-dose Ritalin 8/26, disc continued this on account of nausea and vomiting -Mental status slowly improving, continues to have frequent lipsmacking,(Tardive dyskinesia)  Her mentation has been stable, continue working with physical therapy.    Leukocytosis no clinical signs of infection, wbc  has  been trending down. Continue to hold on antibiotic therapy.    Acute respiratory failure with hypoxia (HCC) -Improved, completed ABX for aspiration pneumonia Continue with aspiration precautions.    Acute on chronic combined systolic and diastolic CHF Most recent Transthoracic Echocardiogram with LVEF of 40-45% (down from 55%) with global hypokinesis and associated RV dysfunction noted.   Patient on diuretic therapy with good toleration, will add potassium supplements.  Blood pressure and volume status are sable.  Hypertension - Continue furosemide for diuresis  on midodrine for blood pressure support.    Anxiety and depression Will resume sertraline and will continue to hold on clonazepam for now.     GERD -Continue Protonix   Atrial fibrillation, chronic (HCC) Patient is on rate/rhythm control and on anticoagulation. -Continue amiodarone for rate control and anticoagulation with apixaban.    AKI (acute kidney injury) ([place]) -Resolved   Hypokalemia/ hypernatremia resolved.     Hyperlipidemia -Continue Crestor   Dysphagia Previously with a Cortrak and tube feedings. Evaluated/followed by speech therapy. -Speech therapy recommendations (8/28)     - regular solids, whole meds with puree (see note)   Acute postoperative anemia due to expected blood loss Acute anemia down to 6.5 requiring 2 units of PRBC. Hemoglobin currently stable.   Thrombocytopenia (HCC)-resolved as of 05/07/2022 Resolved.  Meningioma, cerebral [facility]) Per neurosurgery  Lethargy PM&R recommending considering ritalin 5 mg twice daily  I discussed informally with cards, in absence of RVR and decompensated HF can consider this - started 8/26, will follow response  Leukocytosis Recurrent and persistent. Know specific infectious source identified although patient has been recently treated for recurrent aspiration pneumonia. Recently completed course of unasyn 8/3 CXR 8/19 concerning for edema vs  pneumia repeat CXR without acute abnormality, low lung volumes  Acute respiratory failure with hypoxia (HCC) Secondary to pneumonia with associated tachypnea. Hypoxia improved but not resolved. Repeat chest x-ray with edema vs pneumonia. Patient with leukocytosis and no other infectious symptoms. Down to 1 L/min with Lasix. -Wean as able -repeat CXR with low lung volumes, no acute abnormality  Acute on chronic combined systolic and diastolic CHF (congestive heart failure) (HCC) Most recent Transthoracic Echocardiogram with LVEF of 40-45% (down from 55%) with global hypokinesis and associated RV dysfunction noted. Patient with acute heart failure this admission that responded to Lasix. Currently resolved. Not on GDMT secondary to recent hypotension. -Continue Lasix  Hypertension - Continue lasix, currently on midodrine  Anxiety and depression Previously on Zoloft and Klonopin which are discontinued.  GERD -Continue Protonix  Atrial fibrillation, chronic (HCC) Patient is on rate/rhythm control and on anticoagulation. -Continue amiodarone and Eliquis  AKI (acute kidney injury) ([place]) Possibly secondary to lasix although now has stabilized. Weight appears to have downtrended from admission but is stable for the past week.  Hypokalemia Replace and follow  Hyperlipidemia -Continue Crestor  Dysphagia Previously with a Cortrak and tube feedings. Evaluated/followed by speech therapy. -Speech therapy recommendations (8/28)  - regular solids, whole meds with puree (see note)  Acute postoperative anemia due to expected blood loss Acute anemia down to 6.5 requiring 2 units of PRBC. Hemoglobin currently stable.  Dysfunction of right cardiac ventricle Acute/chronic PE ruled out. Possibly related to pneumonia. Recommendation for repeat Transthoracic Echocardiogram in 6 months. Patient can follow-up with pulmonology.  Hypernatremia-resolved as of 05/07/2022 Resolved.  Thrombocytopenia  (HCC)-resolved as of 05/07/2022 Resolved.        Subjective: Patient has been stable with no chest pain or dyspnea, she has been working with  physical therapy   Physical Exam: Vitals:   05/29/22 0325 05/29/22 0732 05/29/22 1142 05/29/22 1509  BP: (!) 112/48 106/65 127/67 115/67  Pulse: 81 85 86 84  Resp: 18 (!) '23 17 17  '$ Temp: 99.1 F (37.3 C) 98 F (36.7 C) 98.5 F (36.9 C) 98.4 F (36.9 C)  TempSrc: Axillary Oral Oral Oral  SpO2: 92% 92% 92% 92%  Weight:      Height:       Neurology awake and alert ENT with mild pallor Cardiovascular with S1 and S2 present and rhythmic Respiratory with no rales or wheezing Abdomen not distended No lower extremity edema  Data Reviewed:    Family Communication: no family at the bedside   Disposition: Status is: Inpatient Remains inpatient appropriate because: patient is medically stable for discharge.   Planned Discharge Destination:  to be determined    Author: Tawni Millers, MD 05/29/2022 4:30 PM  For on call review [email or web address].

## 2022-05-29 NOTE — Progress Notes (Signed)
Speech Language Pathology Treatment: Dysphagia  Patient Details Name: Tara [name] MRN: [id number] DOB: 10-17-1945 Today's Date: 05/29/2022 Time: 1779-3903 SLP Time Calculation (min) (ACUTE ONLY): 9 min  Assessment / Plan / Recommendation Clinical Impression  Session focused on skilled observation and education with pt's husband and caregiver. Reviewed results of yesterday's MBS, results and recommendations. Pt impulsive with straw sips orange juice and taking consecutive sips requiring therapist to remove straw after 2-3 sips. She did not show signs of aspiration (during MBS had sensed and unsensed penetration). Reiterated small sips and need for family/caregiver to assist by removing straw. Husband and caregiver stated she consumed breakfast and lunch without coughing. Continue ST.   HPI HPI: Pt is a 76 y/o who presented 6/24 with new onset seizures. 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 EEG negative for seizures. Neurosurgey consulted and did not recommend meningioma resection as of 6/27. Decline in swallow function noted overnight 6/27. Dx acute toxic metabolic encephalopathy suspected secondary to keppra. Pt s/p Bifrontal craniotomy for resection of meningioma 7/3.  Cortrak 7/7. 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  CXR 8/6 concerning for patchy airspace disease. PMH: HTN, HLD, GERD, CKD stage IIIb, chronic HFpEF, meningioma, breast CA-s/p right lumpectomy and chemoradiation in 2008, depression/anxiety. BSE 03/11/22: regular texture diet and thin liquids without need for follow up. MBS 8/8 recommended Dys 1, honey thick and repeat MBS on 05/19/22 recommending upgrade to regular solids and nectar thick liquids.      SLP Plan  Continue with current plan of care      Recommendations for follow up therapy are one component of a multi-disciplinary discharge planning process, led by the attending physician.  Recommendations may be updated based on  patient status, additional functional criteria and insurance authorization.    Recommendations  Diet recommendations: Regular;Thin liquid Liquids provided via: Cup;Straw Medication Administration: Crushed with puree Supervision: Full supervision/cueing for compensatory strategies Compensations: Slow rate;Small sips/bites;Clear throat intermittently Postural Changes and/or Swallow Maneuvers: Seated upright 90 degrees                Oral Care Recommendations: Oral care BID Follow Up Recommendations: Home health SLP Assistance recommended at discharge: Frequent or constant Supervision/Assistance SLP Visit Diagnosis: Dysphagia, oropharyngeal phase (R13.12) Plan: Continue with current plan of care           Houston Siren  05/29/2022, 3:08 PM

## 2022-05-29 NOTE — Progress Notes (Signed)
Occupational Therapy Treatment Patient Details Name: Tara [name] MRN: [id number] DOB: 1946/02/28 Today's Date: 05/29/2022   History of present illness 76 yo woman admitted 03/15/22 with new onset seizures related to meningioma. s/p tumor resection 7/3. Recent DC from [facility] 6/22. MRI on 7/26 shows: SDH extending inferiorly along the clivus and  narrowing the foramen magnum with possible mass effect. PMH: CKD stage 3, HFpEF, a fib, meningioma.   OT comments  Pt progressing towards established OT goals and motivated to participate. Family present for educational session. Pt performing functional transfers with Mod A +2. Caregiver, granddaughter, and husband participating in sit<>stand transfer from EOB with close [employer] for safety. Also providing education on placement of lift pad at bed level. Discussing different techniques for bathing, dressing, and toileting with granddaughter - such as sit<>stand on stronger day and bed level when pt is feeling more fatigue. Continue to recommend dc to SNF for post-acute rehab; however, providing education for family to take home with [place].    Of note: Family's goals per CM note on 9/4: "Daughter goals are 1. She wants patient diet to be advanced as much as possible to regular diet with thin liquids. She understands that patient is currently on regular with nectar thin and will be ok if this is the safest diet for her. 2. Daughter is concerned why the patient can not be completely weaned off O2 and wants to have a better understanding of why she continues to need 1L. Would like to know why respiratory treatments have been discontinued? 3. Daughter wants patient to be able to stand and pivot." Patient is progressing towards these goals: 1. Progressed to lin liquids with SLP. 2.  Performing whole therapy session on RA. 3. Family and caregiver participating in sit<>stand transfers.    Recommendations for follow up therapy are one component of a multi-disciplinary  discharge planning process, led by the attending physician.  Recommendations may be updated based on patient status, additional functional criteria and insurance authorization.    Follow Up Recommendations  Skilled nursing-short term rehab (<3 hours/day)    Assistance Recommended at Discharge Frequent or constant Supervision/Assistance  Patient can return home with the following  Two people to help with walking and/or transfers;Two people to help with bathing/dressing/bathroom;Assistance with cooking/housework;Assistance with feeding;Direct supervision/assist for medications management;Direct supervision/assist for financial management;Assist for transportation;Help with stairs or ramp for entrance   Equipment Recommendations  BSC/3in1;Wheelchair (measurements OT);Wheelchair cushion (measurements OT);Hospital bed;Other (comment) Product manager lift)    Recommendations for Other Services Other (comment)    Precautions / Restrictions Precautions Precautions: Fall Precaution Comments: seizures Required Braces or Orthoses: Other Brace Other Brace: bilat resting hand splints,  ankle contracture boot, B Prafos/ankle contracture boot (wearing schedules posted in room) Restrictions Weight Bearing Restrictions: No       Mobility Bed Mobility Overal bed mobility: Needs Assistance Bed Mobility: Rolling, Sidelying to Sit Rolling: Min assist, Mod assist Sidelying to sit: Max assist, +2 for physical assistance            Transfers Overall transfer level: Needs assistance Equipment used: Ambulation equipment used, 1 person hand held assist, Rolling walker (2 wheels) Transfers: Sit to/from Stand, Bed to chair/wheelchair/BSC Sit to Stand: Mod assist, +2 physical assistance Stand pivot transfers: Mod assist, +2 physical assistance         General transfer comment: Mod A +2 for power up and weight shift forward. Mod A +2 for pivot. Letting husband, caregiver, and granddaughter practice  sit<>stand  Balance Overall balance assessment: Needs assistance Sitting-balance support: Feet supported, Single extremity supported, Bilateral upper extremity supported Sitting balance-Leahy Scale: Poor     Standing balance support: Bilateral upper extremity supported Standing balance-Leahy Scale: Poor                             ADL either performed or assessed with clinical judgement   ADL Overall ADL's : Needs assistance/impaired                                            Extremity/Trunk Assessment Upper Extremity Assessment Upper Extremity Assessment: RUE deficits/detail;LUE deficits/detail RUE Sensation:  (unable to assess d/t cog) RUE Coordination: decreased fine motor;decreased gross motor LUE Deficits / Details: fisted grasp upon arrival, very difficult to stretch into extension. Pt did grasp steady bar, but needed assist to initiate grasp/release. L resting hand splint donned at the end of the session due to flexor pattern LUE Sensation:  (unable to assess d/t cog, did not respond to pain) LUE Coordination: decreased fine motor;decreased gross motor   Lower Extremity Assessment RLE Deficits / Details: PROM tight heel cords, increased tone throughout, not moving to command or spontaneously LLE Deficits / Details: PROM tight heel cords, but better than R, mild increased tone, not moving to command or spontaneously        Vision   Vision Assessment?: Vision impaired- to be further tested in functional context Eye Alignment: Impaired (comment) Ocular Range of Motion: Impaired-to be further tested in functional context Alignment/Gaze Preference: Gaze right Tracking/Visual Pursuits: Impaired - to be further tested in functional context   Perception Perception Perception: Impaired   Praxis Praxis Praxis: Impaired Praxis Impairment Details: Initiation;Motor planning;Perseveration    Cognition Arousal/Alertness: Awake/alert Behavior  During Therapy: Flat affect Overall Cognitive Status: Impaired/Different from baseline Area of Impairment: Attention, Following commands, Safety/judgement, Awareness, Problem solving, Memory                 Orientation Level: Disoriented to, Place, Time, Situation Current Attention Level: Sustained Memory: Decreased recall of precautions, Decreased short-term memory Following Commands: Follows one step commands inconsistently, Follows one step commands with increased time Safety/Judgement: Decreased awareness of safety, Decreased awareness of deficits Awareness: Intellectual Problem Solving: Slow processing, Decreased initiation, Difficulty sequencing, Requires verbal cues, Requires tactile cues          Exercises Exercises: Other exercises Other Exercises Other Exercises: Family practicing donning/doffing splints at hands and feet. Other Exercises: Family practicing rollign and placing hoyer lift. Other Exercises: Providing gait belt to family    Shoulder Instructions       General Comments SpO2 94% on RA throughout session. Husband, granddaughter, and caregiver/CNA present throughout    Pertinent Vitals/ Pain       Pain Assessment Pain Assessment: Faces Faces Pain Scale: Hurts a little bit Pain Location: generalized Pain Descriptors / Indicators: Grimacing Pain Intervention(s): Monitored during session, Repositioned  Home Living                                          Prior Functioning/Environment              Frequency  Min 2X/week        Progress Toward Goals  OT Goals(current goals can now be found in the care plan section)  Progress towards OT goals: Progressing toward goals  Acute Rehab OT Goals OT Goal Formulation: With family Time For Goal Achievement: 06/12/22 Potential to Achieve Goals: Fair ADL Goals Pt Will Perform Grooming: with mod assist;sitting Pt Will Perform Upper Body Bathing: with mod assist;sitting Pt Will  Perform Lower Body Bathing: with max assist;sit to/from stand Pt Will Perform Lower Body Dressing: with modified independence;sitting/lateral leans;sit to/from stand Pt Will Transfer to Toilet: with max assist;with +2 assist;bedside commode Pt Will Perform Toileting - Clothing Manipulation and hygiene: with modified independence;sitting/lateral leans;sit to/from stand Additional ADL Goal #1: Pt will track to midline to find family members with moderate directional cues and max assist to turn head Additional ADL Goal #2: Pt will accurately rspond to questions when given choice of 2 answers Additional ADL Goal #3: Ptwill maintain midlien posutral control EOB with min A in preparation for ADL tasks Additional ADL Goal #4: pt will tolerate B resting hand splints at nights to prevent further flexor tightness B hands  Plan Discharge plan remains appropriate;Frequency remains appropriate    Co-evaluation    PT/OT/SLP Co-Evaluation/Treatment: Yes Reason for Co-Treatment: For patient/therapist safety;To address functional/ADL transfers   OT goals addressed during session: ADL's and self-care      AM-PAC OT "6 Clicks" Daily Activity     Outcome Measure   Help from another person eating meals?: A Lot Help from another person taking care of personal grooming?: A Lot Help from another person toileting, which includes using toliet, bedpan, or urinal?: Total Help from another person bathing (including washing, rinsing, drying)?: Total Help from another person to put on and taking off regular upper body clothing?: Total Help from another person to put on and taking off regular lower body clothing?: Total 6 Click Score: 8    End of Session Equipment Utilized During Treatment: Gait belt  OT Visit Diagnosis: Unsteadiness on feet (R26.81);Other abnormalities of gait and mobility (R26.89);Muscle weakness (generalized) (M62.81);Low vision, both eyes (H54.2);Other symptoms and signs involving cognitive  function;Pain Pain - Right/Left:  (generalized) Pain - part of body:  (generalized)   Activity Tolerance Patient tolerated treatment well   Patient Left with call bell/phone within reach;in bed;with bed alarm set;with family/visitor present   Nurse Communication Mobility status;Need for lift equipment        Time: 1312-1406 OT Time Calculation (min): 54 min  Charges: OT General Charges $OT Visit: 1 Visit OT Treatments $Self Care/Home Management : 23-37 mins  Haileigh Pitz MSOT, OTR/L Acute Rehab Office: [place] 05/29/2022, 2:25 PM

## 2022-05-29 NOTE — Progress Notes (Signed)
Patient ID: Tara [name], female   DOB: [birth year], 76 y.o.   MRN: [id number] BP 115/67 (BP Location: Left Leg)   Pulse 84   Temp 98.4 F (36.9 C) (Oral)   Resp 17   Ht '4\' 9"'$  (1.448 m)   Wt 74.7 kg   SpO2 92%   BMI 35.64 kg/m  Alert Follows some commands Moving all extremities

## 2022-05-30 ENCOUNTER — Other Ambulatory Visit (HOSPITAL_COMMUNITY): Payer: Self-pay

## 2022-05-30 DIAGNOSIS — D320 Benign neoplasm of cerebral meninges: Secondary | ICD-10-CM | POA: Diagnosis not present

## 2022-05-30 DIAGNOSIS — R569 Unspecified convulsions: Secondary | ICD-10-CM | POA: Diagnosis not present

## 2022-05-30 DIAGNOSIS — R5383 Other fatigue: Secondary | ICD-10-CM | POA: Diagnosis not present

## 2022-05-30 MED ORDER — IPRATROPIUM-ALBUTEROL 0.5-2.5 (3) MG/3ML IN SOLN
3.0000 mL | Freq: Four times a day (QID) | RESPIRATORY_TRACT | 0 refills | Status: DC | PRN
  Filled 2022-05-30: qty 180, 15d supply, fill #0

## 2022-05-30 MED ORDER — POTASSIUM CHLORIDE CRYS ER 10 MEQ PO TBCR
10.0000 meq | EXTENDED_RELEASE_TABLET | Freq: Every day | ORAL | Status: DC
Start: 2022-05-31 — End: 2022-06-01
  Administered 2022-05-31 – 2022-06-01 (×2): 10 meq via ORAL
  Filled 2022-05-30 (×2): qty 1

## 2022-05-30 NOTE — Progress Notes (Signed)
Progress Note   Patient: Tara [name] IFO:[id number] DOB: [birth year] DOA: 03/15/2022     76 DOS: the patient was seen and examined on 05/30/2022   Brief hospital course: 76 year old African-American female PMH of anxiety, depression, type II DM, HLD, PAF, on Eliquis, CKD 3B, breast cancer SP chemoradiation presented to hospital on 6/24 with new onset seizures. Known history of sphenoidal meningioma that was noted to be increased in size on the CT scan with surrounding edema so she is transferred from [facility] for further evaluation.  She underwent LTM with no further seizures and was evaluated by neurosurgery and underwent bifrontal craniotomy for meningioma resection on 03/24/2020 and was transferred to the intensive care unit postoperatively with PCCM consulting while she was in ICU. 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  Treated with Unasyn for 5 days. 7/11 recurrent sz like activity. Not correlated on EEG. CT unchanged.  7/13 neurology signed off. 7/19 started on ABX for sepsis then transition to Unasyn. 7/21 palliative care was consulted. 7/23 CT chest and abdomen was performed negative for PE or significant pneumonia, without any hemorrhage 7/25 MRI brain without contrast shows increasing mass effect, SDH as well as subacute/acute small scattered infarct. 7/26 tube feeds resumed, neurology reconsulted.  Eliquis resumed per neurosurgery 7/27 oxygenation improving, mentation improving, neurology following.  CTA head and neck without any LVO.  Lower extremity Doppler without any DVT.  Echo shows new systolic dysfunction with global hypokinesis EF 40 to 45% and significant RV failure. 7/30 had another aspiration event. Tube feeds on  hold. Now on NRB. Remains full code. No tracheostomy per husband. Per Neurosurgery, give at least two weeks if not more to see if she improves.  8/1 oxygenation improving again to 5 L.  Trickle tube feeds started. 8/9 on D1 honey thick liquid diet.  Calorie count initiated. 8/13 oxygenation improving to 2 L. 8/14 core track removed nocturnal tube feeds stopped. 8/15 leukocytosis worsening, abdomen distended, x-ray abdomen shows evidence of possible sigmoid volvulus, GI consulted, CT abdomen negative for any volvulus and only shows redundant sigmoid colon.  Will resume diet. 8/24 keppra dose decreased 8/26 ritalin started 8/16-present: leukocytosis of unknown etiology persists 08/29 to 08/30 with vomiting.  09/03 continue to make slow progress with moderate cognitive deficits.  09/06 patient has been medically stable. Pending disposition. Her daughter will like to take her home when stable from neurosurgical point   Assessment and Plan: * Seizure ([place]) Seizures Continue Keppra, Vimpat EEG 8/24 with moderate diffuse encephalopathy, no seizures or epileptiform discharges PM&R suggested to consider decreasing keppra, Dr. Florene Glen discussed with neurology on-call, Keppra dose decreased to 750 mg twice daily on 8/24 -Needs close follow-up with neurology -She is medically stable for discharge.    Meningioma, cerebral (HCC) -Underwent bilateral craniotomy, meningioma resection on 7/3, Dr. Christella Noa -Postop course complicated by seizures, SDH and strokes Her cognition and neurological function have been stable.    Encephalopathy, lethargy PM&R recommended considering ritalin 5 mg twice daily  -Started on low-dose Ritalin 8/26, disc continued this on account of nausea and vomiting -Mental status slowly improving, continues to have frequent lipsmacking,(Tardive dyskinesia)  Her mentation has been stable, continue working with physical therapy.    Leukocytosis no clinical signs of infection, wbc  has  been trending down. Continue to hold on antibiotic therapy.    Acute respiratory failure with hypoxia (HCC) -Improved, completed ABX for aspiration pneumonia Continue with aspiration precautions.    Acute on chronic combined systolic and diastolic CHF Most recent Transthoracic Echocardiogram with LVEF of 40-45% (down from 55%) with global hypokinesis and associated RV dysfunction noted.   Patient on diuretic therapy with good toleration, Continue furosemide and K supplementation  Blood pressure and volume status are sable.  Hypertension - Continue furosemide for diuresis  on midodrine for blood pressure support.    Anxiety and depression Will resume sertraline and will continue to hold on clonazepam for now.     GERD -Continue Protonix   Atrial fibrillation, chronic (HCC) Patient is on rate/rhythm control and on anticoagulation. -Continue amiodarone for rate control and anticoagulation with apixaban.    AKI (acute kidney injury) ([place]) -Resolved   Hypokalemia/ hypernatremia resolved.     Hyperlipidemia -Continue Crestor   Dysphagia Previously with a Cortrak and tube feedings. Evaluated/followed by speech therapy. -Speech therapy recommendations (8/28)     - regular solids, whole meds with puree (see note)   Acute postoperative anemia due to expected blood loss Acute anemia down to 6.5 requiring 2 units of PRBC. Hemoglobin currently stable.   Thrombocytopenia (HCC)-resolved as of 05/07/2022 Resolved.  Meningioma, cerebral [facility]) Per neurosurgery  Lethargy PM&R recommending considering ritalin 5 mg twice daily  I discussed informally with cards, in absence of RVR and decompensated HF can consider this - started 8/26, will follow response  Leukocytosis Recurrent and persistent. Know specific infectious source identified although patient has been recently treated for recurrent aspiration pneumonia. Recently completed course of unasyn 8/3 CXR 8/19 concerning for  edema vs pneumia repeat CXR without acute abnormality, low lung volumes  Acute respiratory failure with hypoxia (HCC) Secondary to pneumonia with associated tachypnea. Hypoxia improved but not resolved. Repeat chest x-ray with edema vs pneumonia. Patient with leukocytosis and no other infectious symptoms. Down to 1 L/min with Lasix. -Wean as able -repeat CXR with low lung volumes, no acute abnormality  Acute on chronic combined systolic and diastolic CHF (congestive heart failure) (HCC) Most recent Transthoracic Echocardiogram with LVEF of 40-45% (down from 55%) with global hypokinesis and associated RV dysfunction noted. Patient with acute heart failure this admission that responded to Lasix. Currently resolved. Not on GDMT secondary to recent hypotension. -Continue Lasix  Hypertension - Continue lasix, currently on midodrine  Anxiety and depression Previously on Zoloft and Klonopin which are discontinued.  GERD -Continue Protonix  Atrial fibrillation, chronic (HCC) Patient is on rate/rhythm control and on anticoagulation. -Continue amiodarone and Eliquis  AKI (acute kidney injury) ([place]) Possibly secondary to lasix although now has stabilized. Weight appears to have downtrended from admission but is stable for the past week.  Hypokalemia Replace and follow  Hyperlipidemia -Continue Crestor  Dysphagia Previously with a Cortrak and tube feedings. Evaluated/followed by speech therapy. -Speech therapy recommendations (8/28)  - regular solids, whole meds with puree (see note)  Acute postoperative anemia due to expected blood loss Acute anemia down to 6.5 requiring 2 units of PRBC. Hemoglobin currently stable.  Dysfunction of right cardiac ventricle Acute/chronic PE ruled out. Possibly related to pneumonia. Recommendation for repeat Transthoracic Echocardiogram in 6 months. Patient can follow-up with pulmonology.  Hypernatremia-resolved as of  05/07/2022 Resolved.  Thrombocytopenia (HCC)-resolved as of 05/07/2022 Resolved.        Subjective: Patient with no chest pain or dyspnea, continue very weak and deconditioned  Physical Exam: Vitals:   05/30/22 0310 05/30/22 0710 05/30/22 1135 05/30/22 1515  BP: (!) 99/55 111/67 116/62 (!) 113/54  Pulse: 75 83 80 78  Resp: 16 15 (!) 22 (!) 22  Temp: 98.1 F (36.7 C) 98.6 F (37 C) 98.1 F (36.7 C) 97.9 F (36.6 C)  TempSrc: Axillary Oral Oral Oral  SpO2:  99% 93% 90%  Weight:      Height:       Neurology is awake and alert  ENT with no pallor Cardiovascular with S1 and S2 present and rhythmic Respiratory with no rales or wheezing Abdomen with no distention  Trace lower extremity edema  Data Reviewed:    Family Communication: no family at the bedside   Disposition: Status is: Inpatient Remains inpatient appropriate because: pending final disposition   Planned Discharge Destination: Home    Author: Tawni Millers, MD 05/30/2022 4:15 PM  For on call review [email or web address].

## 2022-05-30 NOTE — Progress Notes (Signed)
SATURATION QUALIFICATIONS: (This note is used to comply with regulatory documentation for home oxygen)  Patient Saturations on Room Air at Rest = 93%  Patient Saturations on Room Air while Ambulating = 87%    Please briefly explain why patient needs home oxygen: Pt likely needs some supplementary SpO2 while mobilizing to maintain sats >/= 90%.    Moishe Spice, PT, DPT Acute Rehabilitation Services  Office: [phone]

## 2022-05-30 NOTE — Progress Notes (Signed)
Patient ID: Tara [name], female   DOB: [birth year], 76 y.o.   MRN: [id number] BP 92/65 (BP Location: Left Leg)   Pulse 79   Temp (!) 97.4 F (36.3 C) (Oral)   Resp 16   Ht '4\' 9"'$  (1.448 m)   Wt 74.7 kg   SpO2 90%   BMI 35.64 kg/m  Alert, asked how I was doing Will order equipment  Continuing to improve Moving all extremities Wound is clean, dry, no signs of infection

## 2022-05-30 NOTE — TOC Progression Note (Signed)
Transition of Care (TOC) - Progression Note  Marvetta Gibbons RN, BSN Transitions of Care Unit 4E- RN Case Manager See Treatment Team for direct phone #  Cross coverage for 4NP  Patient Details  Name: SKILER TYE MRN: [id number] Date of Birth: [birth year]  Transition of [facility]) CM/SW Contact  Dahlia Client, Romeo Rabon, RN Phone Number: 05/30/2022, 4:00 PM  Clinical Narrative:    CM reached out to daughter [name] to discuss transition needs and offer any assistance needed at this time with plans to transition patient home.  Reviewed with daughter plans to return home w/ HH- confirmed [place] agency of choice as [facility]- will need orders placed for HHRN/PT/OT/aide/ and maybe SLP.  Also asked about outpt Palliative follow up- daughter is thinking about this but states she will most likely want referral for outpt PC at discharge but is still deciding on where her mom will discharge to- back to patient's home vs coming home w/ daughter. Family still working on best plan for transition.   Discussed DME needs and orders that have been placed- confirmed with daughter- will need to have 3n1 w/ drop arms, youth walker, wheelchair w/ drop arms, hoyer lift and stedy, (daughter reports pt already has hospital bed at her home). Daughter also asking about lift chair, home 02 and nebulizer as well as PureWick for home. Explained to daughter about home PureWicks and limited insurance coverage for this in the home- CM will leave info on home Pure Wicks for daughter to look at and f/u on. Also explained how insurance covers part of lift chair (mechanical parts only)- Will send msg to Medical MD regarding home 02 and nebulizer- explained pt will need to qualify for the home 02 prior to discharge for insurance to cover- daughter voiced understanding.  Per daughter does not have a preference for DME company - agreeable to using in house provider.   Daughter also requested info on stretcher transport options- as if pt goes to  her home- would have stairs which are a concern, explained pt would be able to transport home via EMS- but will check on availability for stretcher transport to doctor's appointments, etc.   PT to f/u and place exercise handouts in room for family today per daughter's request as well. Msg has been sent to PT.   [place] placed in room for daughter. TC made call back to daughter to update on this mornings conversation and request. CM has followed up on request and orders placed for home 02/nebulizer- [facility] pharmacy has delivered meds to bedside as well for nebs- family can take these home. CM has also researched stretcher transport and currently did not find availability for area that pt will be in [employer] or daughter's home)- explained to daughter that there is w/c transport available to access for doctor's appointments but not stretcher transport in this area at this time. TOC will f/u with daughter next week for final decision on address patient is going to in order to start working on DME ordering and coordination for delivery prior to pt's discharge.     Expected Discharge Plan: Skilled Nursing Facility Barriers to Discharge: Other (must enter comment) (see CM documentation- waiting neuro MD clearence)  Expected Discharge Plan and Services Expected Discharge Plan: [place]   Discharge Planning Services: CM Consult Post Acute Care Choice: Durable Medical Equipment, Home Health Living arrangements for the past 2 months: [place]  DME Arranged: 3-N-1, Hospital bed, Oxygen, Nebulizer/meds, Walker youth, Wheelchair manual, Other see comment (hoyer lift/stedy, lift chair)         HH Arranged: RN, PT, OT, Nurse's Aide [place] Agency: [place]         Social Determinants of Health (SDOH) Interventions    Readmission Risk Interventions    03/06/2022   12:36 PM  Readmission Risk Prevention Plan  Transportation  Screening Complete  HRI or [place] Complete  Social Work Consult for [place] Planning/Counseling Complete

## 2022-05-30 NOTE — Progress Notes (Signed)
Patient had an episode of emesis while taking her pills with applesauce this morning. So I'm not sure how much of her medications she ingested this morning.

## 2022-05-30 NOTE — Progress Notes (Signed)
   Palliative Medicine Inpatient Follow Up Note   Palliative care has been following along with Tara [name] since 7/21.   Goals of the family are for continued improvement. Patient to discharge home once medically optimized.  Palliative care will sign off at this point though we are happy to be re-consulted if additional care needs arise.  No Charge ______________________________________________________________________________________ [place] Team Team Cell Phone: [phone] Please utilize secure chat with additional questions, if there is no response within 30 minutes please call the above phone number  Palliative Medicine Team providers are available by phone from 7am to 7pm daily and can be reached through the team cell phone.  Should this patient require assistance outside of these hours, please call the patient's attending physician.

## 2022-05-30 NOTE — Progress Notes (Signed)
Mobility Specialist Progress Note   05/30/22 1337  Mobility  Activity Transferred from chair to bed  Level of Assistance Minimal assist, patient does 75% or more  Assistive Device Stedy  Activity Response Tolerated well  $Mobility charge 1 Mobility   Pt requesting to get from chair to bed d/t tiredness. Required MinA for physical assistance, increased time for cognitive delay and mod cues for sequencing. Pt understanding the use of the stedy for transfer, transferred w/o fault. Pt left in bed with all needs met, and call bell in reach.  Holland Falling Mobility Specialist MS [facility] #:  [phone] Acute Rehab Office:  [phone]

## 2022-05-30 NOTE — Progress Notes (Signed)
Physical Therapy Treatment Patient Details Name: Tara [name] MRN: [id number] DOB: 1946/08/02 Today's Date: 05/30/2022   History of Present Illness 76 yo woman admitted 03/15/22 with new onset seizures related to meningioma. s/p tumor resection 7/3. Recent DC from [facility] 6/22. MRI on 7/26 shows: SDH extending inferiorly along the clivus and  narrowing the foramen magnum with possible mass effect. PMH: CKD stage 3, HFpEF, a fib, meningioma.    PT Comments    Focused session on transfer training with pt able to come to stand and perform stand step transfer bed > recliner with modA today. Initiated stepping in place in addition to stepping laterally between surfaces, but pt needing extra time to process cues and initiate movements. Provided therabands and HEP handout. Will continue to follow acutely. Current recommendations remain appropriate.       Recommendations for follow up therapy are one component of a multi-disciplinary discharge planning process, led by the attending physician.  Recommendations may be updated based on patient status, additional functional criteria and insurance authorization.  Follow Up Recommendations  Skilled nursing-short term rehab (<3 hours/day) (max HH services if family declines) Can patient physically be transported by private vehicle: No   Assistance Recommended at Discharge Frequent or constant Supervision/Assistance  Patient can return home with the following Two people to help with walking and/or transfers;Two people to help with bathing/dressing/bathroom;Assist for transportation;Assistance with feeding;Assistance with cooking/housework;Direct supervision/assist for medications management;Direct supervision/assist for financial management;Help with stairs or ramp for entrance   Equipment Recommendations  Wheelchair (measurements PT);Wheelchair cushion (measurements PT);Hospital bed;Other (comment);BSC/3in1 (hoyer lift; stedy; drop-arm w/c and bedside commode)     Recommendations for Other Services       Precautions / Restrictions Precautions Precautions: Fall Precaution Comments: seizures Required Braces or Orthoses: Other Brace Other Brace: bilat resting hand splints,  ankle contracture boot, B Prafos/ankle contracture boot (wearing schedules posted in room) Restrictions Weight Bearing Restrictions: No     Mobility  Bed Mobility Overal bed mobility: Needs Assistance Bed Mobility: Supine to Sit     Supine to sit: Mod assist, HOB elevated     General bed mobility comments: ModA to lift trunk and scoot hips to EOB with extra time for pt to initiate.    Transfers Overall transfer level: Needs assistance Equipment used: 1 person hand held assist, Rolling walker (2 wheels) Transfers: Sit to/from Stand, Bed to chair/wheelchair/BSC Sit to Stand: Mod assist   Step pivot transfers: Mod assist       General transfer comment: ModA for pt to power up to stand holding onto therapist and step to L to transfer bed > recliner. x2 sit to stands from recliner to RW    Ambulation/Gait Ambulation/Gait assistance: Mod assist Gait Distance (Feet): 1 Feet Assistive device: Rolling walker (2 wheels), 1 person hand held assist Gait Pattern/deviations: Step-to pattern, Decreased step length - right, Decreased step length - left, Decreased stride length, Shuffle, Leaning posteriorly, Trunk flexed Gait velocity: very slow Gait velocity interpretation: <1.31 ft/sec, indicative of household ambulator   General Gait Details: Flexed posture, but posterior lean, needing modA for stability and cuing to weight shift and lift legs to step. 1x to L bed > recliner then several steps in place x2 bouts in front of recliner with RW to hold onto.   Stairs             Wheelchair Mobility    Modified Rankin (Stroke Patients Only) Modified Rankin (Stroke Patients Only) Pre-Morbid Rankin Score: Moderately severe disability Modified  Rankin: Severe  disability     Balance Overall balance assessment: Needs assistance Sitting-balance support: Feet supported, Single extremity supported, Bilateral upper extremity supported Sitting balance-Leahy Scale: Poor Sitting balance - Comments: Reliant on UE support and min guard-minA Postural control: Posterior lean Standing balance support: Bilateral upper extremity supported Standing balance-Leahy Scale: Poor Standing balance comment: Reliant on BUE and external support                            Cognition Arousal/Alertness: Awake/alert Behavior During Therapy: Flat affect Overall Cognitive Status: Impaired/Different from baseline Area of Impairment: Attention, Following commands, Safety/judgement, Awareness, Problem solving, Memory                 Orientation Level: Disoriented to, Place, Time, Situation Current Attention Level: Sustained Memory: Decreased recall of precautions, Decreased short-term memory Following Commands: Follows one step commands inconsistently, Follows one step commands with increased time Safety/Judgement: Decreased awareness of safety, Decreased awareness of deficits Awareness: Intellectual Problem Solving: Slow processing, Decreased initiation, Difficulty sequencing, Requires verbal cues, Requires tactile cues General Comments: Pt speaking one word responses majority of time, but intermittently spoke full short sentence. Pt perseverating. Needs repeated multi-modal simple step-by-step cues for all tasks. Extra time for processing required.        Exercises      General Comments General comments (skin integrity, edema, etc.): MedBridge HEP Access Code: [email or web address], provided therabands and dropped off HEP handout; SpO2 dropped to 87% on RA with mobility but recovered to 93% on RW when resting      Pertinent Vitals/Pain Pain Assessment Pain Assessment: Faces Faces Pain Scale: Hurts a little bit Pain Location: generalized Pain Descriptors /  Indicators: Grimacing Pain Intervention(s): Limited activity within patient's tolerance, Monitored during session, Repositioned    Home Living                          Prior Function            PT Goals (current goals can now be found in the care plan section) Acute Rehab PT Goals Patient Stated Goal: to have splint off PT Goal Formulation: With patient Time For Goal Achievement: 06/09/22 Potential to Achieve Goals: Fair Progress towards PT goals: Progressing toward goals    Frequency    Min 3X/week      PT Plan Current plan remains appropriate    Co-evaluation              AM-PAC PT "6 Clicks" Mobility   Outcome Measure  Help needed turning from your back to your side while in a flat bed without using bedrails?: A Lot Help needed moving from lying on your back to sitting on the side of a flat bed without using bedrails?: A Lot Help needed moving to and from a bed to a chair (including a wheelchair)?: A Lot Help needed standing up from a chair using your arms (e.g., wheelchair or bedside chair)?: A Lot Help needed to walk in hospital room?: Total Help needed climbing 3-5 steps with a railing? : Total 6 Click Score: 10    End of Session Equipment Utilized During Treatment: Gait belt Activity Tolerance: Patient tolerated treatment well Patient left: with call bell/phone within reach;in chair;with chair alarm set Nurse Communication: Mobility status PT Visit Diagnosis: Other abnormalities of gait and mobility (R26.89);Other symptoms and signs involving the nervous system (R29.898);Unsteadiness on feet (R26.81);Muscle weakness (generalized) (M62.81);Difficulty in  walking, not elsewhere classified (R26.2)     Time: 6122-4497 PT Time Calculation (min) (ACUTE ONLY): 23 min  Charges:  $Therapeutic Activity: 23-37 mins                     Moishe Spice, PT, DPT Acute Rehabilitation Services  Office: [phone]    Orvan Falconer 05/30/2022, 4:18  PM

## 2022-05-31 ENCOUNTER — Other Ambulatory Visit (HOSPITAL_COMMUNITY): Payer: Self-pay

## 2022-05-31 DIAGNOSIS — R569 Unspecified convulsions: Secondary | ICD-10-CM | POA: Diagnosis not present

## 2022-05-31 DIAGNOSIS — D320 Benign neoplasm of cerebral meninges: Secondary | ICD-10-CM | POA: Diagnosis not present

## 2022-05-31 DIAGNOSIS — G40909 Epilepsy, unspecified, not intractable, without status epilepticus: Secondary | ICD-10-CM

## 2022-05-31 NOTE — Progress Notes (Signed)
   Providing Compassionate, Quality Care - Together  NEUROSURGERY PROGRESS NOTE   S: No issues overnight.   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  Please do not hesitate to call with questions or concerns.   Elwin Sleight, [facility] Cell: [phone]

## 2022-05-31 NOTE — Plan of Care (Signed)
Problem: Education: Goal: Ability to describe self-care measures that may prevent or decrease complications (Diabetes Survival Skills Education) will improve Outcome: Progressing Goal: Individualized Educational Video(s) Outcome: Progressing   Problem: Coping: Goal: Ability to adjust to condition or change in health will improve Outcome: Progressing   Problem: Fluid Volume: Goal: Ability to maintain a balanced intake and output will improve Outcome: Progressing   Problem: Health Behavior/Discharge Planning: Goal: Ability to identify and utilize available resources and services will improve Outcome: Progressing Goal: Ability to manage health-related needs will improve Outcome: Progressing   Problem: Metabolic: Goal: Ability to maintain appropriate glucose levels will improve Outcome: Progressing   Problem: Nutritional: Goal: Maintenance of adequate nutrition will improve Outcome: Progressing Goal: Progress toward achieving an optimal weight will improve Outcome: Progressing   Problem: Skin Integrity: Goal: Risk for impaired skin integrity will decrease Outcome: Progressing   Problem: Tissue Perfusion: Goal: Adequacy of tissue perfusion will improve Outcome: Progressing   Problem: Education: Goal: Knowledge of General Education information will improve Description: Including pain rating scale, medication(s)/side effects and non-pharmacologic comfort measures Outcome: Progressing   Problem: Health Behavior/Discharge Planning: Goal: Ability to manage health-related needs will improve Outcome: Progressing   Problem: Clinical Measurements: Goal: Ability to maintain clinical measurements within normal limits will improve Outcome: Progressing Goal: Will remain free from infection Outcome: Progressing Goal: Diagnostic test results will improve Outcome: Progressing Goal: Respiratory complications will improve Outcome: Progressing Goal: Cardiovascular complication will  be avoided Outcome: Progressing   Problem: Activity: Goal: Risk for activity intolerance will decrease Outcome: Progressing   Problem: Nutrition: Goal: Adequate nutrition will be maintained Outcome: Progressing   Problem: Coping: Goal: Level of anxiety will decrease Outcome: Progressing   Problem: Elimination: Goal: Will not experience complications related to bowel motility Outcome: Progressing Goal: Will not experience complications related to urinary retention Outcome: Progressing   Problem: Pain Managment: Goal: General experience of comfort will improve Outcome: Progressing   Problem: Safety: Goal: Ability to remain free from injury will improve Outcome: Progressing   Problem: Skin Integrity: Goal: Risk for impaired skin integrity will decrease Outcome: Progressing   Problem: Education: Goal: Expressions of having a comfortable level of knowledge regarding the disease process will increase Outcome: Progressing   Problem: Coping: Goal: Ability to adjust to condition or change in health will improve Outcome: Progressing Goal: Ability to identify appropriate support needs will improve Outcome: Progressing   Problem: Health Behavior/Discharge Planning: Goal: Compliance with prescribed medication regimen will improve Outcome: Progressing   Problem: Education: Goal: Knowledge of General Education information will improve Description: Including pain rating scale, medication(s)/side effects and non-pharmacologic comfort measures Outcome: Progressing   Problem: Health Behavior/Discharge Planning: Goal: Ability to manage health-related needs will improve Outcome: Progressing   Problem: Clinical Measurements: Goal: Ability to maintain clinical measurements within normal limits will improve Outcome: Progressing Goal: Will remain free from infection Outcome: Progressing Goal: Diagnostic test results will improve Outcome: Progressing Goal: Respiratory  complications will improve Outcome: Progressing Goal: Cardiovascular complication will be avoided Outcome: Progressing   Problem: Activity: Goal: Risk for activity intolerance will decrease Outcome: Progressing   Problem: Nutrition: Goal: Adequate nutrition will be maintained Outcome: Progressing   Problem: Coping: Goal: Level of anxiety will decrease Outcome: Progressing   Problem: Elimination: Goal: Will not experience complications related to bowel motility Outcome: Progressing Goal: Will not experience complications related to urinary retention Outcome: Progressing   Problem: Pain Managment: Goal: General experience of comfort will improve Outcome: Progressing   Problem: Safety: Goal: Ability  to remain free from injury will improve Outcome: Progressing   Problem: Skin Integrity: Goal: Risk for impaired skin integrity will decrease Outcome: Progressing

## 2022-05-31 NOTE — Progress Notes (Signed)
Progress Note   Patient: Tara [name] LXB:[id number] DOB: 1945-11-12 DOA: 03/15/2022     77 DOS: the patient was seen and examined on 05/31/2022   Brief hospital course: 76 year old African-American female PMH of anxiety, depression, type II DM, HLD, PAF, on Eliquis, CKD 3B, breast cancer SP chemoradiation presented to hospital on 6/24 with new onset seizures. Known history of sphenoidal meningioma that was noted to be increased in size on the CT scan with surrounding edema so she is transferred from [facility] for further evaluation.  She underwent LTM with no further seizures and was evaluated by neurosurgery and underwent bifrontal craniotomy for meningioma resection on 03/24/2020 and was transferred to the intensive care unit postoperatively with PCCM consulting while she was in ICU. 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  Treated with Unasyn for 5 days. 7/11 recurrent sz like activity. Not correlated on EEG. CT unchanged.  7/13 neurology signed off. 7/19 started on ABX for sepsis then transition to Unasyn. 7/21 palliative care was consulted. 7/23 CT chest and abdomen was performed negative for PE or significant pneumonia, without any hemorrhage 7/25 MRI brain without contrast shows increasing mass effect, SDH as well as subacute/acute small scattered infarct. 7/26 tube feeds resumed, neurology reconsulted.  Eliquis resumed per neurosurgery 7/27 oxygenation improving, mentation improving, neurology following.  CTA head and neck without any LVO.  Lower extremity Doppler without any DVT.  Echo shows new systolic dysfunction with global hypokinesis EF 40 to 45% and significant RV failure. 7/30 had another aspiration event. Tube feeds on  hold. Now on NRB. Remains full code. No tracheostomy per husband. Per Neurosurgery, give at least two weeks if not more to see if she improves.  8/1 oxygenation improving again to 5 L.  Trickle tube feeds started. 8/9 on D1 honey thick liquid diet.  Calorie count initiated. 8/13 oxygenation improving to 2 L. 8/14 core track removed nocturnal tube feeds stopped. 8/15 leukocytosis worsening, abdomen distended, x-ray abdomen shows evidence of possible sigmoid volvulus, GI consulted, CT abdomen negative for any volvulus and only shows redundant sigmoid colon.  Will resume diet. 8/24 keppra dose decreased 8/26 ritalin started 8/16-present: leukocytosis of unknown etiology persists 08/29 to 08/30 with vomiting.  09/03 continue to make slow progress with moderate cognitive deficits.  09/06 patient has been medically stable. Pending disposition. Her daughter will like to take her home when stable from neurosurgical point   Assessment and Plan: * Seizure ([place]) Seizures Continue Keppra, Vimpat EEG 8/24 with moderate diffuse encephalopathy, no seizures or epileptiform discharges PM&R suggested to consider decreasing keppra, Dr. Florene Glen discussed with neurology on-call, Keppra dose decreased to 750 mg twice daily on 8/24 -Needs close follow-up with neurology -She is medically stable for discharge.    Meningioma, cerebral (HCC) -Underwent bilateral craniotomy, meningioma resection on 7/3, Dr. Christella Noa -Postop course complicated by seizures, SDH and strokes Her cognition and neurological function have been stable.    Encephalopathy, lethargy PM&R recommended considering ritalin 5 mg twice daily  -Started on low-dose Ritalin 8/26, disc continued this on account of nausea and vomiting -Mental status slowly improving, continues to have frequent lipsmacking,(Tardive dyskinesia)  Her mentation has been stable, continue working with physical therapy.    Leukocytosis no clinical signs of infection, wbc  has  been trending down. Continue to hold on antibiotic therapy.    Acute respiratory failure with hypoxia (HCC) -Improved, completed ABX for aspiration pneumonia Continue with aspiration precautions.    Acute on chronic combined systolic and diastolic CHF Most recent Transthoracic Echocardiogram with LVEF of 40-45% (down from 55%) with global hypokinesis and associated RV dysfunction noted.   Patient on diuretic therapy with good toleration, Continue furosemide and K supplementation  Blood pressure and volume status are sable.  Hypertension - Continue furosemide for diuresis  on midodrine for blood pressure support.    Anxiety and depression Will resume sertraline and will continue to hold on clonazepam for now.     GERD -Continue Protonix   Atrial fibrillation, chronic (HCC) Patient is on rate/rhythm control and on anticoagulation. -Continue amiodarone for rate control and anticoagulation with apixaban.    AKI (acute kidney injury) ([place]) -Resolved   Hypokalemia/ hypernatremia resolved.     Hyperlipidemia -Continue Crestor   Dysphagia Previously with a Cortrak and tube feedings. Evaluated/followed by speech therapy. -Speech therapy recommendations (8/28)     - regular solids, whole meds with puree (see note)   Acute postoperative anemia due to expected blood loss Acute anemia down to 6.5 requiring 2 units of PRBC. Hemoglobin currently stable.   Thrombocytopenia (HCC)-resolved as of 05/07/2022 Resolved.  Meningioma, cerebral [facility]) Per neurosurgery  Lethargy PM&R recommending considering ritalin 5 mg twice daily  I discussed informally with cards, in absence of RVR and decompensated HF can consider this - started 8/26, will follow response  Leukocytosis Recurrent and persistent. Know specific infectious source identified although patient has been recently treated for recurrent aspiration pneumonia. Recently completed course of unasyn 8/3 CXR 8/19 concerning for  edema vs pneumia repeat CXR without acute abnormality, low lung volumes  Acute respiratory failure with hypoxia (HCC) Secondary to pneumonia with associated tachypnea. Hypoxia improved but not resolved. Repeat chest x-ray with edema vs pneumonia. Patient with leukocytosis and no other infectious symptoms. Down to 1 L/min with Lasix. -Wean as able -repeat CXR with low lung volumes, no acute abnormality  Acute on chronic combined systolic and diastolic CHF (congestive heart failure) (HCC) Most recent Transthoracic Echocardiogram with LVEF of 40-45% (down from 55%) with global hypokinesis and associated RV dysfunction noted. Patient with acute heart failure this admission that responded to Lasix. Currently resolved. Not on GDMT secondary to recent hypotension. -Continue Lasix  Hypertension - Continue lasix, currently on midodrine  Anxiety and depression Previously on Zoloft and Klonopin which are discontinued.  GERD -Continue Protonix  Atrial fibrillation, chronic (HCC) Patient is on rate/rhythm control and on anticoagulation. -Continue amiodarone and Eliquis  AKI (acute kidney injury) ([place]) Possibly secondary to lasix although now has stabilized. Weight appears to have downtrended from admission but is stable for the past week.  Hypokalemia Replace and follow  Hyperlipidemia -Continue Crestor  Dysphagia Previously with a Cortrak and tube feedings. Evaluated/followed by speech therapy. -Speech therapy recommendations (8/28)  - regular solids, whole meds with puree (see note)  Acute postoperative anemia due to expected blood loss Acute anemia down to 6.5 requiring 2 units of PRBC. Hemoglobin currently stable.  Dysfunction of right cardiac ventricle Acute/chronic PE ruled out. Possibly related to pneumonia. Recommendation for repeat Transthoracic Echocardiogram in 6 months. Patient can follow-up with pulmonology.  Hypernatremia-resolved as of  05/07/2022 Resolved.  Thrombocytopenia (HCC)-resolved as of 05/07/2022 Resolved.        Subjective: Patient out of bed to the chair, no dyspnea or chest pain,  Physical Exam: Vitals:   05/30/22 2356 05/31/22 0354 05/31/22 0718 05/31/22 1046  BP: 114/61 (!) 106/54 113/71 118/73  Pulse: 77 77 76 84  Resp: '20 20 20 20  '$ Temp: 97.7 F (36.5 C) 98.7 F (37.1 C) 97.6 F (36.4 C) 98 F (36.7 C)  TempSrc: Oral Axillary Axillary Oral  SpO2: 92% 100% 100% 96%  Weight:      Height:       Neurology awake and alert, repetitive movement with her lips, but able to follow commands and answer to simple questions ENT with mild pallor Cardiovascular with S1 and S2 present and rhythmic Respiratory with no rales or wheezing on anterior auscultation  Abdomen with no distention  Trace lower extremity edema  Data Reviewed:    Family Communication: I spoke with patient's sister at the bedside, we talked in detail about patient's condition, plan of care and prognosis and all questions were addressed.   Disposition: Status is: Inpatient Remains inpatient appropriate because: pending placement   Planned Discharge Destination: Home per primary team.       Author: Tawni Millers, MD 05/31/2022 3:10 PM  For on call review [email or web address].

## 2022-06-01 DIAGNOSIS — D320 Benign neoplasm of cerebral meninges: Secondary | ICD-10-CM | POA: Diagnosis not present

## 2022-06-01 DIAGNOSIS — R5383 Other fatigue: Secondary | ICD-10-CM | POA: Diagnosis not present

## 2022-06-01 DIAGNOSIS — D72822 Plasmacytosis: Secondary | ICD-10-CM | POA: Diagnosis not present

## 2022-06-01 DIAGNOSIS — R569 Unspecified convulsions: Secondary | ICD-10-CM | POA: Diagnosis not present

## 2022-06-01 NOTE — Plan of Care (Signed)
Problem: Education: Goal: Ability to describe self-care measures that may prevent or decrease complications (Diabetes Survival Skills Education) will improve Outcome: Progressing Goal: Individualized Educational Video(s) Outcome: Progressing   Problem: Coping: Goal: Ability to adjust to condition or change in health will improve Outcome: Progressing   Problem: Fluid Volume: Goal: Ability to maintain a balanced intake and output will improve Outcome: Progressing   Problem: Health Behavior/Discharge Planning: Goal: Ability to identify and utilize available resources and services will improve Outcome: Progressing Goal: Ability to manage health-related needs will improve Outcome: Progressing   Problem: Metabolic: Goal: Ability to maintain appropriate glucose levels will improve Outcome: Progressing   Problem: Nutritional: Goal: Maintenance of adequate nutrition will improve Outcome: Progressing Goal: Progress toward achieving an optimal weight will improve Outcome: Progressing   Problem: Skin Integrity: Goal: Risk for impaired skin integrity will decrease Outcome: Progressing   Problem: Tissue Perfusion: Goal: Adequacy of tissue perfusion will improve Outcome: Progressing   Problem: Education: Goal: Knowledge of General Education information will improve Description: Including pain rating scale, medication(s)/side effects and non-pharmacologic comfort measures Outcome: Progressing   Problem: Health Behavior/Discharge Planning: Goal: Ability to manage health-related needs will improve Outcome: Progressing   Problem: Clinical Measurements: Goal: Ability to maintain clinical measurements within normal limits will improve Outcome: Progressing Goal: Will remain free from infection Outcome: Progressing Goal: Diagnostic test results will improve Outcome: Progressing Goal: Respiratory complications will improve Outcome: Progressing Goal: Cardiovascular complication will  be avoided Outcome: Progressing   Problem: Activity: Goal: Risk for activity intolerance will decrease Outcome: Progressing   Problem: Nutrition: Goal: Adequate nutrition will be maintained Outcome: Progressing   Problem: Coping: Goal: Level of anxiety will decrease Outcome: Progressing   Problem: Elimination: Goal: Will not experience complications related to bowel motility Outcome: Progressing Goal: Will not experience complications related to urinary retention Outcome: Progressing   Problem: Pain Managment: Goal: General experience of comfort will improve Outcome: Progressing   Problem: Safety: Goal: Ability to remain free from injury will improve Outcome: Progressing   Problem: Skin Integrity: Goal: Risk for impaired skin integrity will decrease Outcome: Progressing   Problem: Education: Goal: Expressions of having a comfortable level of knowledge regarding the disease process will increase Outcome: Progressing   Problem: Coping: Goal: Ability to adjust to condition or change in health will improve Outcome: Progressing Goal: Ability to identify appropriate support needs will improve Outcome: Progressing   Problem: Health Behavior/Discharge Planning: Goal: Compliance with prescribed medication regimen will improve Outcome: Progressing   Problem: Education: Goal: Knowledge of General Education information will improve Description: Including pain rating scale, medication(s)/side effects and non-pharmacologic comfort measures Outcome: Progressing   Problem: Health Behavior/Discharge Planning: Goal: Ability to manage health-related needs will improve Outcome: Progressing   Problem: Clinical Measurements: Goal: Ability to maintain clinical measurements within normal limits will improve Outcome: Progressing Goal: Will remain free from infection Outcome: Progressing Goal: Diagnostic test results will improve Outcome: Progressing Goal: Respiratory  complications will improve Outcome: Progressing Goal: Cardiovascular complication will be avoided Outcome: Progressing   Problem: Activity: Goal: Risk for activity intolerance will decrease Outcome: Progressing   Problem: Nutrition: Goal: Adequate nutrition will be maintained Outcome: Progressing   Problem: Coping: Goal: Level of anxiety will decrease Outcome: Progressing   Problem: Elimination: Goal: Will not experience complications related to bowel motility Outcome: Progressing Goal: Will not experience complications related to urinary retention Outcome: Progressing   Problem: Pain Managment: Goal: General experience of comfort will improve Outcome: Progressing   Problem: Safety: Goal: Ability  to remain free from injury will improve Outcome: Progressing   Problem: Skin Integrity: Goal: Risk for impaired skin integrity will decrease Outcome: Progressing

## 2022-06-01 NOTE — Progress Notes (Addendum)
Progress Note   Patient: Tara [name] VCB:[id number] DOB: [birth year] DOA: 03/15/2022     76 DOS: the patient was seen and examined on 06/01/2022   Brief hospital course: 76 year old African-American female PMH of anxiety, depression, type II DM, HLD, PAF, on Eliquis, CKD 3B, breast cancer SP chemoradiation presented to hospital on 6/24 with new onset seizures. Known history of sphenoidal meningioma that was noted to be increased in size on the CT scan with surrounding edema so she is transferred from [facility] for further evaluation.  She underwent LTM with no further seizures and was evaluated by neurosurgery and underwent bifrontal craniotomy for meningioma resection on 03/24/2020 and was transferred to the intensive care unit postoperatively with PCCM consulting while she was in ICU. 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  Treated with Unasyn for 5 days. 7/11 recurrent sz like activity. Not correlated on EEG. CT unchanged.  7/13 neurology signed off.  07/17 transferred to [facility].   7/19 started on ABX for sepsis then transition to Unasyn. 7/21 palliative care was consulted. 7/23 CT chest and abdomen was performed negative for PE or significant pneumonia, without any hemorrhage 7/25 MRI brain without contrast shows increasing mass effect, SDH as well as subacute/acute small scattered infarct. 7/26 tube feeds resumed, neurology reconsulted.  Eliquis resumed per neurosurgery 7/27 oxygenation improving, mentation improving, neurology following.  CTA head and neck without any LVO.  Lower extremity Doppler without any DVT.  Echo shows new systolic dysfunction with global hypokinesis EF 40 to 45% and significant RV failure. 7/30 had another  aspiration event. Tube feeds on hold. Now on NRB. Remains full code. No tracheostomy per husband. Per Neurosurgery, give at least two weeks if not more to see if she improves.  8/1 oxygenation improving again to 5 L.  Trickle tube feeds started. 8/9 on D1 honey thick liquid diet.  Calorie count initiated. 8/13 oxygenation improving to 2 L. 8/14 core track removed nocturnal tube feeds stopped. 8/15 leukocytosis worsening, abdomen distended, x-ray abdomen shows evidence of possible sigmoid volvulus, GI consulted, CT abdomen negative for any volvulus and only shows redundant sigmoid colon.  Will resume diet. 8/24 keppra dose decreased 8/26 ritalin started 8/16-present: leukocytosis of unknown etiology persists 08/29 to 08/30 with vomiting.  09/03 continue to make slow progress with moderate cognitive deficits.  09/06 patient has been medically stable. Pending disposition. Her daughter will like to take her home when stable from neurosurgical point    Assessment and Plan: * Seizure ([place]) Seizures Continue Keppra, Vimpat EEG 8/24 with moderate diffuse encephalopathy, no seizures or epileptiform discharges PM&R suggested to consider decreasing keppra, Dr. Florene Glen discussed with neurology on-call, Keppra dose decreased to 750 mg twice daily on 8/24 -Needs close follow-up with neurology -She is medically stable for discharge.  Continue with keppra and vimpat    Meningioma, cerebral (HCC) -Underwent bilateral craniotomy, meningioma resection on 7/3, Dr. Christella Noa -Postop course complicated by seizures, SDH and strokes Her cognition and neurological function have been stable.    Encephalopathy, lethargy PM&R recommended considering ritalin 5 mg twice daily  -Started on low-dose Ritalin 8/26, disc continued this on account of nausea and vomiting -Mental status slowly improving, continues to have frequent lipsmacking,(Tardive dyskinesia)  Continue to improve mentation.  Leukocytosis Resolved.  Continue to hold on antibiotic therapy.    Acute respiratory failure with hypoxia (HCC) -Improved, completed ABX for aspiration pneumonia Continue with aspiration precautions.    Acute on chronic combined systolic and diastolic CHF Most recent Transthoracic Echocardiogram with LVEF of 40-45% (down from 55%) with global hypokinesis and associated RV dysfunction noted.   Patient on diuretic therapy with good toleration, Will do furosemide as needed, hold on K supplements.  Blood pressure and volume status are sable.  Hypertension - Continue furosemide for diuresis  Will hold on midodrine and have close follow up on blood pressure.    Anxiety and depression Continue with sertraline.    GERD -Continue Protonix   Atrial fibrillation, chronic (HCC) Patient is on rate/rhythm control and on anticoagulation. -Continue amiodarone for rate control and anticoagulation with apixaban.    AKI (acute kidney injury) ([place]) -Resolved   Hypokalemia/ hypernatremia resolved.     Hyperlipidemia -Continue Crestor   Dysphagia Previously with a Cortrak and tube feedings. Evaluated/followed by speech therapy. -Speech therapy recommendations (8/28)     - regular solids, whole meds with puree (see note)   Acute postoperative anemia due to expected blood loss Acute anemia down to 6.5 requiring 2 units of PRBC. Hemoglobin currently stable.   Thrombocytopenia (HCC)-resolved as of 05/07/2022 Resolved.  Meningioma, cerebral [facility]) Per neurosurgery  Lethargy PM&R recommending considering ritalin 5 mg twice daily  I discussed informally with cards, in absence of RVR and decompensated HF can consider this - started 8/26, will follow response  Leukocytosis Recurrent and persistent. Know specific infectious source identified although patient has been recently treated for recurrent aspiration pneumonia. Recently completed course of unasyn 8/3 CXR 8/19 concerning for edema vs  pneumia repeat CXR without acute abnormality, low lung volumes  Acute respiratory failure with hypoxia (HCC) Secondary to pneumonia with associated tachypnea. Hypoxia improved but not resolved. Repeat chest x-ray with edema vs pneumonia. Patient with leukocytosis and no other infectious symptoms. Down to 1 L/min with Lasix. -Wean as able -repeat CXR with low lung volumes, no acute abnormality  Acute on chronic combined systolic and diastolic CHF (congestive heart failure) (HCC) Most recent Transthoracic Echocardiogram with LVEF of 40-45% (down from 55%) with global hypokinesis and associated RV dysfunction noted. Patient with acute heart failure this admission that responded to Lasix. Currently resolved. Not on GDMT secondary to recent hypotension. -Continue Lasix  Hypertension - Continue lasix, currently on midodrine  Anxiety and depression Previously on Zoloft and Klonopin which are discontinued.  GERD -Continue Protonix  Atrial fibrillation, chronic (HCC) Patient is on rate/rhythm control and on anticoagulation. -Continue amiodarone and Eliquis  AKI (acute kidney injury) ([place]) Possibly secondary to lasix although now has stabilized. Weight appears to have downtrended from admission but is stable for the past week.  Hypokalemia Replace and follow  Hyperlipidemia -Continue Crestor  Dysphagia Previously with a Cortrak and tube feedings. Evaluated/followed by speech therapy. -Speech therapy recommendations (8/28)  - regular solids, whole meds with puree (see note)  Acute postoperative anemia due to expected blood loss Acute anemia down to 6.5 requiring 2 units of PRBC. Hemoglobin currently stable.  Dysfunction of right cardiac ventricle Acute/chronic PE ruled out. Possibly related to pneumonia. Recommendation for repeat Transthoracic Echocardiogram in 6 months. Patient can follow-up with pulmonology.  Hypernatremia-resolved as of 05/07/2022 Resolved.  Thrombocytopenia  (HCC)-resolved as of 05/07/2022 Resolved.        Subjective: Patient is feeling well, no chest pain or dyspnea.   Physical Exam: Vitals:  06/01/22 0415 06/01/22 0819 06/01/22 1122 06/01/22 1620  BP: (!) 109/52 (!) 116/51 (!) 100/40 (!) 149/55  Pulse: 76 73 75 75  Resp: 20 (!) '21 18 20  '$ Temp: 97.9 F (36.6 C) 98.1 F (36.7 C) (!) 97.5 F (36.4 C) 98.9 F (37.2 C)  TempSrc: Axillary Oral Oral Oral  SpO2: 94% 91% 93% 95%  Weight:      Height:       Neurology awake and alert, she responds to simple questions,  ENT with mild pallor Cardiovascular with S1 and S2 present and rhythmic  Respiratory with no rales or wheezing Abdomen with no distention  Lower extremity with trace edea,  Data Reviewed:    Family Communication: family at the bedside   Disposition: Status is: Inpatient Remains inpatient appropriate because: pending final disposition   Planned Discharge Destination:  home with family      Author: Tawni Millers, MD 06/01/2022 4:23 PM  For on call review [email or web address].

## 2022-06-01 NOTE — Progress Notes (Addendum)
  NEUROSURGERY PROGRESS NOTE   No issues overnight. No complaints this am  EXAM:  BP (!) 116/51 (BP Location: Left Leg)   Pulse 73   Temp 98.1 F (36.7 C) (Oral)   Resp (!) 21   Ht '4\' 9"'$  (1.448 m)   Wt 74.7 kg   SpO2 91%   BMI 35.64 kg/m   Awake, alert, oriented  Speech fluent, appropriate  CN grossly intact  5/5 BUE/BLE  Wound c/d/i  IMPRESSION:  76 y.o. female s/p bifrontal crani for meningioma, stable  PLAN: - Cont to mobilize - Likely SNF upon d/c - Cont Keppra/Vimpat - Appreciate hospitalist assistance   Consuella Lose, MD [facility]

## 2022-06-02 DIAGNOSIS — R5383 Other fatigue: Secondary | ICD-10-CM | POA: Diagnosis not present

## 2022-06-02 DIAGNOSIS — I10 Essential (primary) hypertension: Secondary | ICD-10-CM | POA: Diagnosis not present

## 2022-06-02 DIAGNOSIS — D72822 Plasmacytosis: Secondary | ICD-10-CM | POA: Diagnosis not present

## 2022-06-02 DIAGNOSIS — R569 Unspecified convulsions: Secondary | ICD-10-CM | POA: Diagnosis not present

## 2022-06-02 LAB — BASIC METABOLIC PANEL
Anion gap: 8 (ref 5–15)
BUN: 18 mg/dL (ref 8–23)
CO2: 22 mmol/L (ref 22–32)
Calcium: 8.9 mg/dL (ref 8.9–10.3)
Chloride: 107 mmol/L (ref 98–111)
Creatinine, Ser: 1.04 mg/dL — ABNORMAL HIGH (ref 0.44–1.00)
GFR, Estimated: 56 mL/min — ABNORMAL LOW (ref >60)
Glucose, Bld: 96 mg/dL (ref 70–99)
Potassium: 3.6 mmol/L (ref 3.5–5.1)
Sodium: 137 mmol/L (ref 135–145)

## 2022-06-02 NOTE — Progress Notes (Signed)
Physical Therapy Treatment Patient Details Name: Tara [name] MRN: [id number] DOB: [birth year] Today's Date: 06/02/2022   History of Present Illness 76 yo woman admitted 03/15/22 with new onset seizures related to meningioma. s/p tumor resection 7/3. Recent DC from [facility] 6/22. MRI on 7/26 shows: SDH extending inferiorly along the clivus and  narrowing the foramen magnum with possible mass effect. PMH: CKD stage 3, HFpEF, a fib, meningioma.    PT Comments    Patient progressing with mobility able to ambulate several feet in her room using Harmon Pier walker for more support as pt fearful and spouse educated to be by her side as well to aide in confidence.  She still gets SOB though VSS throughout.  Feel anxiety component at play.  She had already been up in chair and anxious to get back to bed per spouse, and just back to bed with OT assist.  Patient, however, agreeable to up again to attempt ambulation with Harmon Pier walker.  Patient remains appropriate for home with family support and max HH services (aide, SW, PT, OT, SLP and RN).  Will continue to involve family to allow improved comfort and success with transition to home.    Recommendations for follow up therapy are one component of a multi-disciplinary discharge planning process, led by the attending physician.  Recommendations may be updated based on patient status, additional functional criteria and insurance authorization.  Follow Up Recommendations  Home health PT Can patient physically be transported by private vehicle: Yes   Assistance Recommended at Discharge Frequent or constant Supervision/Assistance  Patient can return home with the following A lot of help with bathing/dressing/bathroom;A lot of help with walking and/or transfers;Assist for transportation;Assistance with cooking/housework;Help with stairs or ramp for entrance;Direct supervision/assist for financial management;Direct supervision/assist for medications management   Equipment  Recommendations  Wheelchair (measurements PT);Wheelchair cushion (measurements PT);Hospital bed;BSC/3in1;Rolling walker (2 wheels)    Recommendations for Other Services       Precautions / Restrictions Precautions Precautions: Fall Precaution Comments: seizures Required Braces or Orthoses: Other Brace Other Brace: bilat resting hand splints,  ankle contracture boot, B Prafos/ankle contracture boot (wearing schedules posted in room) Restrictions Weight Bearing Restrictions: No     Mobility  Bed Mobility Overal bed mobility: Needs Assistance Bed Mobility: Supine to Sit, Sit to Supine     Supine to sit: HOB elevated, Min guard Sit to supine: Mod assist   General bed mobility comments: up to sitting increased time and cues for initiation, but able to lift trunk upright and use rail; to supine assist for legs and spouse assisted wtih +2 to scoot to [place] Overall transfer level: Needs assistance Equipment used: Ambulation equipment used Transfers: Sit to/from Stand Sit to Stand: Min assist           General transfer comment: pulling up on eva walker to stand, cues for foot position under her    Ambulation/Gait Ambulation/Gait assistance: Mod assist, +2 safety/equipment Gait Distance (Feet): 8 Feet (& 4') Assistive device: Ethelene Hal Gait Pattern/deviations: Step-to pattern, Decreased stride length, Decreased step length - right, Trunk flexed, Wide base of support, Decreased dorsiflexion - right       General Gait Details: flexed at hips but more upright with Harmon Pier walker, taking steps though increased time and shorter strides with R LE; forward from bed toward window, then turned and walked to end of her bed, sat in recliner due to SOB, but VSS; then moved chair to allow ambulation forward back to  bed; spouse present and assisting on R side, PT on L side   Stairs             Wheelchair Mobility    Modified Rankin (Stroke Patients Only) Modified  Rankin (Stroke Patients Only) Pre-Morbid Rankin Score: Moderately severe disability Modified Rankin: Moderately severe disability     Balance Overall balance assessment: Needs assistance   Sitting balance-Leahy Scale: Fair Sitting balance - Comments: scooting to EOB dynamic balance with close S   Standing balance support: Bilateral upper extremity supported Standing balance-Leahy Scale: Poor Standing balance comment: UE support for balance                            Cognition Arousal/Alertness: Awake/alert Behavior During Therapy: Flat affect Overall Cognitive Status: Impaired/Different from baseline Area of Impairment: Attention, Following commands, Safety/judgement, Problem solving, Awareness                   Current Attention Level: Sustained Memory: Decreased short-term memory Following Commands: Follows one step commands inconsistently, Follows one step commands with increased time Safety/Judgement: Decreased awareness of deficits, Decreased awareness of safety   Problem Solving: Slow processing, Decreased initiation General Comments: communicating in full sentences, though does some repeating, seems more alert, though per spouse fatigued after up in chair since 1130-2 and restless trying to get back to bed        Exercises      General Comments General comments (skin integrity, edema, etc.): Spouse present and supportive and assisted with transfer with cues for staying on her R side and to assist with managing the [facility] walker      Pertinent Vitals/Pain Pain Assessment Faces Pain Scale: No hurt    Home Living                          Prior Function            PT Goals (current goals can now be found in the care plan section) Progress towards PT goals: Progressing toward goals    Frequency    Min 3X/week      PT Plan Current plan remains appropriate    Co-evaluation              AM-PAC PT "6 Clicks" Mobility    Outcome Measure  Help needed turning from your back to your side while in a flat bed without using bedrails?: A Lot Help needed moving from lying on your back to sitting on the side of a flat bed without using bedrails?: A Lot Help needed moving to and from a bed to a chair (including a wheelchair)?: A Little Help needed standing up from a chair using your arms (e.g., wheelchair or bedside chair)?: A Little Help needed to walk in hospital room?: Total Help needed climbing 3-5 steps with a railing? : Total 6 Click Score: 12    End of Session Equipment Utilized During Treatment: Gait belt Activity Tolerance: Patient tolerated treatment well Patient left: in bed;with call bell/phone within reach;with family/visitor present   PT Visit Diagnosis: Other abnormalities of gait and mobility (R26.89);Other symptoms and signs involving the nervous system (R29.898);Unsteadiness on feet (R26.81);Muscle weakness (generalized) (M62.81);Difficulty in walking, not elsewhere classified (R26.2)     Time: 6440-3474 PT Time Calculation (min) (ACUTE ONLY): 29 min  Charges:  $Gait Training: 8-22 mins $Therapeutic Activity: 8-22 mins  Magda Kiel, PT Acute Rehabilitation Services Office:[phone] 06/02/2022    [name] 06/02/2022, 5:24 PM

## 2022-06-02 NOTE — Progress Notes (Signed)
Speech Language Pathology Treatment: Cognitive-Linquistic  Patient Details Name: Tara [name] MRN: [id number] DOB: 1945/10/29 Today's Date: 06/02/2022 Time: 0206-0229 SLP Time Calculation (min) (ACUTE ONLY): 23 min  Assessment / Plan / Recommendation Clinical Impression  Pt was alert throughout encounter with husband at bedside. Her speech initiation appeared considerably improved since previous encounter. She responded to questions immediately after they were posed and requested her wants/needs. Pt accurately named and described family members/friends, which is an improvment since the previous cog tx where she was unable to name loved ones. During a reading task, pt displayed errors skipping words and whole lines indicating the continued presence of attention issues or potential visual organization errors. However, during a picture description task targeting utterance expansion, the pt displayed adequate attention skills and accurately described the picture with syntactically complete sentences. During problem solving task pt had difficulty with mental calculations. Pt's utterances throughout encounter were significantly longer than previous encounters. Pt continues to display perseverative thoughts in both structured tasks and natural conversation, however, she displayed awareness of the errors by continually attempting to self correct. Pt and husband educated purpose of cognitive tx. S/T will follow for cog tx.   HPI HPI: Pt is a 76 y/o who presented 6/24 with new onset seizures. 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 EEG negative for seizures. Neurosurgey consulted and did not recommend meningioma resection as of 6/27. Decline in swallow function noted overnight 6/27. Dx acute toxic metabolic encephalopathy suspected secondary to keppra. Pt s/p Bifrontal craniotomy for resection of meningioma 7/3.  Cortrak 7/7. 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   CXR 8/6 concerning for patchy airspace disease. PMH: HTN, HLD, GERD, CKD stage IIIb, chronic HFpEF, meningioma, breast CA-s/p right lumpectomy and chemoradiation in 2008, depression/anxiety. BSE 03/11/22: regular texture diet and thin liquids without need for follow up. MBS 8/8 recommended Dys 1, honey thick and repeat MBS on 05/19/22 recommending upgrade to regular solids and nectar thick liquids.      SLP Plan  Continue with current plan of care      Recommendations for follow up therapy are one component of a multi-disciplinary discharge planning process, led by the attending physician.  Recommendations may be updated based on patient status, additional functional criteria and insurance authorization.    Recommendations                   Oral Care Recommendations: Oral care BID Follow Up Recommendations: Home health SLP Assistance recommended at discharge: Frequent or constant Supervision/Assistance SLP Visit Diagnosis: Cognitive communication deficit (W09.811) Plan: Continue with current plan of care          [facility] Student SLP   06/02/2022, 3:17 PM

## 2022-06-02 NOTE — Progress Notes (Signed)
Occupational Therapy Treatment Patient Details Name: Tara [name] MRN: [id number] DOB: December 20, 1945 Today's Date: 06/02/2022   History of present illness 76 yo woman admitted 03/15/22 with new onset seizures related to meningioma. s/p tumor resection 7/3. Recent DC from [facility] 6/22. MRI on 7/26 shows: SDH extending inferiorly along the clivus and  narrowing the foramen magnum with possible mass effect. PMH: CKD stage 3, HFpEF, a fib, meningioma.   OT comments  Tara [name] is making steady progress with plans to d/c home with family soon per Husband's report. Pt attempting to stand from chair and get back to bed upon arrival, overall she required min A to stand and max A for pivotal steps toward the bed. Pt required simple multimodal cues and increased time for processing and increased assist to manage RLE. Once sitting EOB, pt initiated laying down but ultimately needed max A. BLE splints donned and RUE resting hand splint donned. OT to continue to follow. POC remains appropriate, if pt d/c's home with family she will need HHOT and max HH services.    Recommendations for follow up therapy are one component of a multi-disciplinary discharge planning process, led by the attending physician.  Recommendations may be updated based on patient status, additional functional criteria and insurance authorization.    Follow Up Recommendations  Skilled nursing-short term rehab (<3 hours/day) (family planning to take pt home - will need max HH services.)    Assistance Recommended at Discharge Frequent or constant Supervision/Assistance  Patient can return home with the following  Two people to help with walking and/or transfers;Two people to help with bathing/dressing/bathroom;Assistance with cooking/housework;Assistance with feeding;Direct supervision/assist for medications management;Direct supervision/assist for financial management;Assist for transportation;Help with stairs or ramp for entrance   Equipment  Recommendations  BSC/3in1;Wheelchair (measurements OT);Wheelchair cushion (measurements OT);Hospital bed;Other (comment)    Recommendations for Other Services Other (comment)    Precautions / Restrictions Precautions Precautions: Fall Precaution Comments: seizures Required Braces or Orthoses: Other Brace Other Brace: bilat resting hand splints,  ankle contracture boot, B Prafos/ankle contracture boot (wearing schedules posted in room) Restrictions Weight Bearing Restrictions: No       Mobility Bed Mobility Overal bed mobility: Needs Assistance Bed Mobility: Sit to Supine       Sit to supine: Max assist   General bed mobility comments: cues, assist for initiation, trunk control and management of LEs    Transfers Overall transfer level: Needs assistance Equipment used: 1 person hand held assist, Rolling walker (2 wheels) Transfers: Sit to/from Stand, Bed to chair/wheelchair/BSC Sit to Stand: Min assist     Step pivot transfers: Max assist     General transfer comment: max A for pivotal stepping to the bed from the chair, multimodal cues and increased time needed     Balance Overall balance assessment: Needs assistance Sitting-balance support: Feet supported, Single extremity supported, Bilateral upper extremity supported Sitting balance-Leahy Scale: Poor Sitting balance - Comments: Reliant on UE support and min guard-minA   Standing balance support: Bilateral upper extremity supported Standing balance-Leahy Scale: Poor Standing balance comment: Reliant on BUE and external support                           ADL either performed or assessed with clinical judgement   ADL Overall ADL's : Needs assistance/impaired                         Toilet Transfer: Maximal assistance;Stand-pivot Toilet  Transfer Details (indicate cue type and reason): simulated. pivotal stepping from chair to bed. More difficulty with moving RLE         Functional  mobility during ADLs: Maximal assistance General ADL Comments: continues to perseverate on 1-2 words. Able to express needs and respond to questions appropriately with increased time    Extremity/Trunk Assessment Upper Extremity Assessment Upper Extremity Assessment: RUE deficits/detail;LUE deficits/detail RUE Deficits / Details: reaching towards therapist to initiate stand, maintained fisted grasp until cued to open. R spliint donned at the end of the session RUE Coordination: decreased fine motor;decreased gross motor LUE Deficits / Details: reaching for therapist to intiate stand. LUE Coordination: decreased fine motor;decreased gross motor   Lower Extremity Assessment Lower Extremity Assessment: Defer to PT evaluation        Vision   Vision Assessment?: Vision impaired- to be further tested in functional context Additional Comments: good visual attention this date   Perception Perception Perception: Impaired   Praxis Praxis Praxis: Impaired Praxis Impairment Details: Initiation;Motor planning;Perseveration    Cognition Arousal/Alertness: Awake/alert Behavior During Therapy: Flat affect Overall Cognitive Status: Impaired/Different from baseline Area of Impairment: Attention, Following commands, Safety/judgement, Problem solving, Awareness                 Orientation Level: Disoriented to, Situation Current Attention Level: Sustained Memory: Decreased recall of precautions, Decreased short-term memory Following Commands: Follows one step commands with increased time Safety/Judgement: Decreased awareness of safety, Decreased awareness of deficits Awareness: Intellectual Problem Solving: Slow processing, Decreased initiation, Requires verbal cues General Comments: Pt speaking one word responses majority of time, but intermittently spoke full short sentence. Pt perseverating. Needs repeated multi-modal simple step-by-step cues for all tasks. Extra time for processing  required.              General Comments VSS, husband present and supportive    Pertinent Vitals/ Pain       Pain Assessment Pain Assessment: Faces Faces Pain Scale: No hurt Pain Intervention(s): Monitored during session   Frequency  Min 2X/week        Progress Toward Goals  OT Goals(current goals can now be found in the care plan section)  Progress towards OT goals: Progressing toward goals  Acute Rehab OT Goals Patient Stated Goal: per family for michiko to walk OT Goal Formulation: With family Time For Goal Achievement: 06/12/22 Potential to Achieve Goals: Fair ADL Goals Pt Will Perform Grooming: with mod assist;sitting Pt Will Perform Upper Body Bathing: with mod assist;sitting Pt Will Perform Lower Body Bathing: with max assist;sit to/from stand Pt Will Perform Lower Body Dressing: with modified independence;sitting/lateral leans;sit to/from stand Pt Will Transfer to Toilet: with min assist;with +2 assist;bedside commode;stand pivot transfer Pt Will Perform Toileting - Clothing Manipulation and hygiene: with modified independence;sitting/lateral leans;sit to/from stand Additional ADL Goal #1: Pt will track to midline to find family members with moderate directional cues and max assist to turn head Additional ADL Goal #2: Pt will follow simple commands 75% of time during ADL Additional ADL Goal #3: Ptwill maintain midlien posutral control EOB with min A in preparation for ADL tasks Additional ADL Goal #4: pt will tolerate B resting hand splints at nights to prevent further flexor tightness B hands  Plan Discharge plan remains appropriate;Frequency remains appropriate       AM-PAC OT "6 Clicks" Daily Activity     Outcome Measure   Help from another person eating meals?: A Lot Help from another person taking care of personal grooming?:  A Lot Help from another person toileting, which includes using toliet, bedpan, or urinal?: Total Help from another person bathing  (including washing, rinsing, drying)?: Total Help from another person to put on and taking off regular upper body clothing?: A Lot Help from another person to put on and taking off regular lower body clothing?: Total 6 Click Score: 9    End of Session Equipment Utilized During Treatment: Gait belt  OT Visit Diagnosis: Unsteadiness on feet (R26.81);Other abnormalities of gait and mobility (R26.89);Muscle weakness (generalized) (M62.81);Low vision, both eyes (H54.2);Other symptoms and signs involving cognitive function;Pain   Activity Tolerance Patient tolerated treatment well   Patient Left with call bell/phone within reach;in bed;with bed alarm set;with family/visitor present   Nurse Communication Mobility status;Need for lift equipment        Time: 2595-6387 OT Time Calculation (min): 15 min  Charges: OT General Charges $OT Visit: 1 Visit OT Treatments $Self Care/Home Management : 8-22 mins    Elliot Cousin 06/02/2022, 4:13 PM

## 2022-06-02 NOTE — Progress Notes (Signed)
Patient ID: Tara [name], female   DOB: 17-Mar-1946, 76 y.o.   MRN: [id number] BP (!) 122/51 (BP Location: Left Leg)   Pulse 77   Temp 98.4 F (36.9 C) (Oral)   Resp (!) 24   Ht '4\' 9"'$  (1.448 m)   Wt 74.7 kg   SpO2 90%   BMI 35.64 kg/m  Alert, oriented Moving all extremities Wound is clean, dry Awaiting home delivery of equipment which did not happen today.

## 2022-06-02 NOTE — Progress Notes (Addendum)
Progress Note   Patient: Tara [name] [id number]:[id number] DOB: 08-Dec-1945 DOA: 03/15/2022     79 DOS: the patient was seen and examined on 06/02/2022   Brief hospital course: 76 year old African-American female PMH of anxiety, depression, type II DM, HLD, PAF, on Eliquis, CKD 3B, breast cancer SP chemoradiation presented to hospital on 6/24 with new onset seizures. Known history of sphenoidal meningioma that was noted to be increased in size on the CT scan with surrounding edema so she is transferred from [facility] for further evaluation.  She underwent LTM with no further seizures and was evaluated by neurosurgery and underwent bifrontal craniotomy for meningioma resection on 03/24/2020 and was transferred to the intensive care unit postoperatively with PCCM consulting while she was in ICU. 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  Treated with Unasyn for 5 days. 7/11 recurrent sz like activity. Not correlated on EEG. CT unchanged.  7/13 neurology signed off.  07/17 transferred to [facility].   7/19 started on ABX for sepsis then transition to Unasyn. 7/21 palliative care was consulted. 7/23 CT chest and abdomen was performed negative for PE or significant pneumonia, without any hemorrhage 7/25 MRI brain without contrast shows increasing mass effect, SDH as well as subacute/acute small scattered infarct. 7/26 tube feeds resumed, neurology reconsulted.  Eliquis resumed per neurosurgery 7/27 oxygenation improving, mentation improving, neurology following.  CTA head and neck without any LVO.  Lower extremity Doppler without any DVT.  Echo shows new systolic dysfunction with global hypokinesis EF 40 to 45% and significant RV failure. 7/30 had another  aspiration event. Tube feeds on hold. Now on NRB. Remains full code. No tracheostomy per husband. Per Neurosurgery, give at least two weeks if not more to see if she improves.  8/1 oxygenation improving again to 5 L.  Trickle tube feeds started. 8/9 on D1 honey thick liquid diet.  Calorie count initiated. 8/13 oxygenation improving to 2 L. 8/14 core track removed nocturnal tube feeds stopped. 8/15 leukocytosis worsening, abdomen distended, x-ray abdomen shows evidence of possible sigmoid volvulus, GI consulted, CT abdomen negative for any volvulus and only shows redundant sigmoid colon.  Will resume diet. 8/24 keppra dose decreased 8/26 ritalin started 8/16-present: leukocytosis of unknown etiology persists 08/29 to 08/30 with vomiting.  09/03 continue to make slow progress with moderate cognitive deficits.  09/06 patient has been medically stable. Pending disposition. Her daughter will like to take her home when stable from neurosurgical point   09/11 patient has been medically stable, midodrine and loop diuretic therapy have been discontinued with good toleration.    Assessment and Plan: * Seizure ([place]) Seizures Continue Keppra, Vimpat EEG 8/24 with moderate diffuse encephalopathy, no seizures or epileptiform discharges PM&R suggested to consider decreasing keppra, Dr. Florene Glen discussed with neurology on-call, Keppra dose decreased to 750 mg twice daily on 8/24 -Needs close follow-up with neurology -She is medically stable for discharge.  Continue with keppra and vimpat    Meningioma, cerebral (HCC) -Underwent bilateral craniotomy, meningioma resection on 7/3, Dr. Christella Noa -Postop course complicated by seizures, SDH and strokes Her cognition and neurological function have been stable.    Encephalopathy, lethargy PM&R recommended considering ritalin 5 mg twice daily  -Started on low-dose Ritalin 8/26, disc continued this on account of nausea and vomiting -  Mental status slowly  improving, continues to have frequent lipsmacking,(Tardive dyskinesia)  Resume sertraline with good toleration.  Continue to improve mentation.    Leukocytosis Resolved.  Continue to hold on antibiotic therapy.    Acute respiratory failure with hypoxia (HCC) -Improved, completed ABX for aspiration pneumonia Continue with aspiration precautions.    Acute on chronic combined systolic and diastolic CHF Most recent Transthoracic Echocardiogram with LVEF of 40-45% (down from 55%) with global hypokinesis and associated RV dysfunction noted.   Midodrine and furosemide have been discontinued with good toleration No signs of volume overload Follow up Echocardiogram in 3 months, after her discharge.   Hypertension - Continue furosemide for diuresis  Will hold on midodrine and have close follow up on blood pressure.    Anxiety and depression Continue with sertraline.    GERD -Continue Protonix   Atrial fibrillation, chronic (HCC) Patient is on rate/rhythm control and on anticoagulation. -Continue amiodarone for rate control and anticoagulation with apixaban.    AKI (acute kidney injury) ([place]) -Resolved   Hypokalemia/ hypernatremia resolved.   Hyperlipidemia -Continue Crestor   Dysphagia Previously with a Cortrak and tube feedings. Evaluated/followed by speech therapy. -Speech therapy recommendations (8/28)     - regular solids, whole meds with puree (see note)   Acute postoperative anemia due to expected blood loss Acute anemia down to 6.5 requiring 2 units of PRBC. Hemoglobin currently stable.   Thrombocytopenia (HCC)-resolved as of 05/07/2022 Resolved.  Meningioma, cerebral [facility]) Per neurosurgery  Lethargy PM&R recommending considering ritalin 5 mg twice daily  I discussed informally with cards, in absence of RVR and decompensated HF can consider this - started 8/26, will follow response  Leukocytosis Recurrent and persistent. Know specific infectious source  identified although patient has been recently treated for recurrent aspiration pneumonia. Recently completed course of unasyn 8/3 CXR 8/19 concerning for edema vs pneumia repeat CXR without acute abnormality, low lung volumes  Acute respiratory failure with hypoxia (HCC) Secondary to pneumonia with associated tachypnea. Hypoxia improved but not resolved. Repeat chest x-ray with edema vs pneumonia. Patient with leukocytosis and no other infectious symptoms. Down to 1 L/min with Lasix. -Wean as able -repeat CXR with low lung volumes, no acute abnormality  Acute on chronic combined systolic and diastolic CHF (congestive heart failure) (HCC) Most recent Transthoracic Echocardiogram with LVEF of 40-45% (down from 55%) with global hypokinesis and associated RV dysfunction noted. Patient with acute heart failure this admission that responded to Lasix. Currently resolved. Not on GDMT secondary to recent hypotension. -Continue Lasix  Hypertension - Continue lasix, currently on midodrine  Anxiety and depression Previously on Zoloft and Klonopin which are discontinued.  GERD -Continue Protonix  Atrial fibrillation, chronic (HCC) Patient is on rate/rhythm control and on anticoagulation. -Continue amiodarone and Eliquis  AKI (acute kidney injury) ([place]) Possibly secondary to lasix although now has stabilized. Weight appears to have downtrended from admission but is stable for the past week.  Hypokalemia Replace and follow  Hyperlipidemia -Continue Crestor  Dysphagia Previously with a Cortrak and tube feedings. Evaluated/followed by speech therapy. -Speech therapy recommendations (8/28)  - regular solids, whole meds with puree (see note)  Acute postoperative anemia due to expected blood loss Acute anemia down to 6.5 requiring 2 units of PRBC. Hemoglobin currently stable.  Dysfunction of right cardiac ventricle Acute/chronic PE ruled out. Possibly related to pneumonia. Recommendation for  repeat Transthoracic Echocardiogram in 6 months. Patient can follow-up with pulmonology.  Hypernatremia-resolved as of 05/07/2022 Resolved.  Thrombocytopenia (HCC)-resolved as of 05/07/2022  Resolved.        Subjective: Patient with no chest pain or dyspnea, able to respond to simple questions.   Physical Exam: Vitals:   06/02/22 0300 06/02/22 0400 06/02/22 0821 06/02/22 1100  BP: (!) 104/46  (!) 109/93 (!) 104/53  Pulse: 75 75 75 79  Resp: 20 (!) '21 14 18  '$ Temp:   98 F (36.7 C) 97.8 F (36.6 C)  TempSrc:   Oral Oral  SpO2: 90% 90% 97% 99%  Weight:      Height:       Neurology awake and alert, she continue to do repetitive movements with her lips, able to follow simple commands and respond to simple questions ENT with mild pallor Cardiovascular with S1 and S2 present and rhythmic Respiratory with no rales or wheezing Abdomen with no distention  No lower extremity edema  Data Reviewed:    Family Communication: I spoke with patient's family at the bedside, we talked in detail about patient's condition, plan of care and prognosis and all questions were addressed.    Disposition: Status is: Inpatient Remains inpatient appropriate because: pending final recommendations from primary team regards discharge. Patient is medically stable   Planned Discharge Destination: Home with home health services per her family request     Author: Tawni Millers, MD 06/02/2022 4:34 PM  For on call review [email or web address].

## 2022-06-02 NOTE — TOC Progression Note (Signed)
Transition of Care (TOC) - Progression Note  Marvetta Gibbons RN,BSN Transitions of Care Unit 4NP (Non Trauma)- RN Case Manager See Treatment Team for direct Phone #    Patient Details  Name: Tara [name] MRN: [id number] Date of Birth: April 25, 1946  Transition of Care [facility]) CM/SW Contact  Dahlia Client, Romeo Rabon, RN Phone Number: 06/02/2022, 5:16 PM  Clinical Narrative:    Spoke with daughter [name]- per Fredderick Erb has confirmed that family has decided that pt will go home with daughter to her home in [place].  Address to daughter's home: [place],  [id number] [phone]  Daughter voiced she is ready to have DME set up for delivery and is agreeable to use [employer] for DME needs- DME list re-confirmed again with Daughter.   As it is after 5pm- CM will call [employer] in the AM for DME needs- pt will also need to have a qualifying note done for home 02 to see if she will qualify for insurance- CM will send msg to staff in am.   Daughter is still interested in home [employer]- she spoke with Dr. Loretha Stapler and will f/u with him for needed documentation.   Per [name] she and Dr. Christella Noa discussed transition plan on 9/8 and are planning for possible discharge home w/ [facility] on 06/04/22 pending DME delivery. HH orders still needed as well for HHRN/PT/OT/SLP/aide.   Pt will need to transport home via EMS.    Expected Discharge Plan: Skilled Nursing Facility Barriers to Discharge: Other (must enter comment) (see CM documentation- waiting neuro MD clearence)  Expected Discharge Plan and Services Expected Discharge Plan: [place]   Discharge Planning Services: CM Consult Post Acute Care Choice: Durable Medical Equipment, Home Health Living arrangements for the past 2 months: [place]                 DME Arranged: 3-N-1, Hospital bed, Oxygen, Nebulizer/meds, Environmental consultant youth, Wheelchair manual, Other see comment (hoyer lift/stedy, lift  chair)         HH Arranged: RN, PT, OT, Nurse's Aide HH Agency: [facility]         Social Determinants of Health (SDOH) Interventions    Readmission Risk Interventions    03/06/2022   12:36 PM  Readmission Risk Prevention Plan  Transportation Screening Complete  HRI or Home Care Consult Complete  Social Work Consult for [place] Planning/Counseling Complete

## 2022-06-03 DIAGNOSIS — D320 Benign neoplasm of cerebral meninges: Secondary | ICD-10-CM | POA: Diagnosis not present

## 2022-06-03 DIAGNOSIS — R569 Unspecified convulsions: Secondary | ICD-10-CM | POA: Diagnosis not present

## 2022-06-03 DIAGNOSIS — N179 Acute kidney failure, unspecified: Secondary | ICD-10-CM | POA: Diagnosis not present

## 2022-06-03 DIAGNOSIS — I482 Chronic atrial fibrillation, unspecified: Secondary | ICD-10-CM | POA: Diagnosis not present

## 2022-06-03 NOTE — Progress Notes (Addendum)
Progress Note   Patient: Tara [name] PZW:[id number] DOB: [birth year] DOA: 03/15/2022     80 DOS: the patient was seen and examined on 06/03/2022   Brief hospital course: 76 year old African-American female PMH of anxiety, depression, type II DM, HLD, PAF, on Eliquis, CKD 3B, breast cancer SP chemoradiation presented to hospital on 6/24 with new onset seizures.  Known history of sphenoidal meningioma that was noted to be increased in size on the CT scan with surrounding edema so she is transferred from [facility] for further evaluation.  She underwent LTM with no further seizures and was evaluated by neurosurgery and underwent bifrontal craniotomy for meningioma resection on 03/24/2020 and was transferred to the intensive care unit postoperatively with PCCM consulting while she was in ICU.  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  Treated with Unasyn for 5 days. 7/11 recurrent sz like activity. Not correlated on EEG. CT unchanged.  7/13 neurology signed off.  07/17 consult transferred to [facility] from [facility]  7/19 started on ABX for sepsis then transition to Unasyn. 7/21 palliative care was consulted. 7/23 CT chest and abdomen was performed negative for PE or significant pneumonia, without any hemorrhage 7/25 MRI brain without contrast shows increasing mass effect, SDH as well as subacute/acute small scattered infarct. 7/26 tube feeds resumed, neurology reconsulted.  Eliquis resumed per neurosurgery 7/27 oxygenation improving, mentation improving, neurology following.  CTA head and neck without any LVO.  Lower extremity Doppler without any DVT.  Echo shows new systolic dysfunction with global hypokinesis EF 40 to 45% and significant RV  failure. 7/30 had another aspiration event. Tube feeds on hold. Now on NRB. Remains full code. No tracheostomy per husband. Per Neurosurgery, give at least two weeks if not more to see if she improves.  8/1 oxygenation improving again to 5 L.  Trickle tube feeds started. 8/9 on D1 honey thick liquid diet.  Calorie count initiated. 8/13 oxygenation improving to 2 L. 8/14 core track removed nocturnal tube feeds stopped. 8/15 leukocytosis worsening, abdomen distended, x-ray abdomen shows evidence of possible sigmoid volvulus, GI consulted, CT abdomen negative for any volvulus and only shows redundant sigmoid colon.  Will resume diet. 8/24 keppra dose decreased 8/26 ritalin started, then discontinued  8/16-present: leukocytosis of unknown etiology persists 08/29 to 08/30 with vomiting.  09/03 continue to make slow progress with moderate cognitive deficits.  09/06 patient has been medically stable. Pending disposition. Her daughter will like to take her home when stable from neurosurgical point   09/11 patient has been medically stable, midodrine and loop diuretic therapy have been discontinued with good toleration.   09/12 patient is medically stable for discharge, will need follow up as outpatient.   Assessment and Plan: * Seizure ([place]) Seizures Continue Keppra, Vimpat EEG 8/24 with moderate diffuse encephalopathy, no seizures or epileptiform discharges PM&R suggested to consider decreasing keppra, Dr. Florene Glen discussed with neurology on-call, Keppra dose decreased to 750 mg twice daily on 8/24 -Needs close follow-up with neurology -She is medically stable for discharge.  Continue with keppra and vimpat    Meningioma, cerebral (HCC) -Underwent bilateral craniotomy, meningioma resection on 7/3, Dr. Christella Noa -Postop course complicated by seizures, SDH and strokes Her cognition and neurological function have been stable.    Encephalopathy, lethargy PM&R recommended  considering ritalin 5 mg  twice daily  -Started on low-dose Ritalin 8/26, disc continued this on account of nausea and vomiting -Mental status slowly improving, continues to have frequent lipsmacking,(Tardive dyskinesia)  Resume sertraline with good toleration.  Continue to improve mentation.    Leukocytosis Resolved.  Continue to hold on antibiotic therapy.    Acute respiratory failure with hypoxia (HCC) -Improved, completed ABX for aspiration pneumonia Continue with aspiration precautions.    Acute on chronic combined systolic and diastolic CHF Most recent Transthoracic Echocardiogram with LVEF of 40-45% (down from 55%) with global hypokinesis and associated RV dysfunction noted.   Midodrine and furosemide have been discontinued with good toleration No signs of volume overload Follow up Echocardiogram in 3 months, after her discharge.   Hypertension Blood pressure has remained stable, off midodrine.  Continue to hold on diuretic therapy for now.    Anxiety and depression Continue with sertraline.    GERD -Continue Protonix   Atrial fibrillation, chronic (HCC) Patient is on rate/rhythm control and on anticoagulation. -Continue amiodarone for rate control and anticoagulation with apixaban.    AKI (acute kidney injury) ([place]) -Resolved   Hypokalemia/ hypernatremia resolved.   Hyperlipidemia -Continue Crestor   Dysphagia Previously with a Cortrak and tube feedings. Evaluated/followed by speech therapy. -Speech therapy recommendations (8/28)     - regular solids, whole meds with puree (see note)   Acute postoperative anemia due to expected blood loss Acute anemia down to 6.5 requiring 2 units of PRBC. Hemoglobin currently stable.   Thrombocytopenia (HCC)-resolved as of 05/07/2022 Resolved.  Meningioma, cerebral [facility]) Per neurosurgery  Lethargy PM&R recommending considering ritalin 5 mg twice daily  I discussed informally with cards, in absence of RVR and decompensated HF can consider this  - started 8/26, will follow response  Leukocytosis Recurrent and persistent. Know specific infectious source identified although patient has been recently treated for recurrent aspiration pneumonia. Recently completed course of unasyn 8/3 CXR 8/19 concerning for edema vs pneumia repeat CXR without acute abnormality, low lung volumes  Acute respiratory failure with hypoxia (HCC) Secondary to pneumonia with associated tachypnea. Hypoxia improved but not resolved. Repeat chest x-ray with edema vs pneumonia. Patient with leukocytosis and no other infectious symptoms. Down to 1 L/min with Lasix. -Wean as able -repeat CXR with low lung volumes, no acute abnormality  Acute on chronic combined systolic and diastolic CHF (congestive heart failure) (HCC) Most recent Transthoracic Echocardiogram with LVEF of 40-45% (down from 55%) with global hypokinesis and associated RV dysfunction noted. Patient with acute heart failure this admission that responded to Lasix. Currently resolved. Not on GDMT secondary to recent hypotension. -Continue Lasix  Hypertension - Continue lasix, currently on midodrine  Anxiety and depression Previously on Zoloft and Klonopin which are discontinued.  GERD -Continue Protonix  Atrial fibrillation, chronic (HCC) Patient is on rate/rhythm control and on anticoagulation. -Continue amiodarone and Eliquis  AKI (acute kidney injury) ([place]) Possibly secondary to lasix although now has stabilized. Weight appears to have downtrended from admission but is stable for the past week.  Hypokalemia Replace and follow  Hyperlipidemia -Continue Crestor  Dysphagia Previously with a Cortrak and tube feedings. Evaluated/followed by speech therapy. -Speech therapy recommendations (8/28)  - regular solids, whole meds with puree (see note)  Acute postoperative anemia due to expected blood loss Acute anemia down to 6.5 requiring 2 units of PRBC. Hemoglobin currently  stable.  Dysfunction of right cardiac ventricle Acute/chronic PE ruled out. Possibly related to pneumonia. Recommendation for repeat Transthoracic Echocardiogram in  6 months. Patient can follow-up with pulmonology.  Hypernatremia-resolved as of 05/07/2022 Resolved.  Thrombocytopenia (HCC)-resolved as of 05/07/2022 Resolved.        Subjective: Patient out of bed to the chair, on room air, no dyspnea or edema   Physical Exam: Vitals:   06/03/22 1115 06/03/22 1200 06/03/22 1400 06/03/22 1459  BP: (!) 97/54   131/74  Pulse: 74 69 78 81  Resp: '12 16 20 '$ (!) 23  Temp: 98.8 F (37.1 C)   (!) 97.2 F (36.2 C)  TempSrc: Oral   Axillary  SpO2: 91% 94% 98% 92%  Weight:      Height:       BP 131/74   Pulse 81   Temp (!) 97.2 F (36.2 C) (Axillary)   Resp (!) 23   Ht '4\' 9"'$  (1.448 m)   Wt 74.7 kg   SpO2 92%   BMI 35.64 kg/m    Neurology awake and alert ENT with mild pallor Cardiovascular with S1 and S2 present and rhythmic Respiratory with no rales or wheezing Abdomen not distended  No lower extremity edema  Data Reviewed:    Family Communication: no family at the bedside   Disposition: Status is: Inpatient Remains inpatient appropriate because: patient will be likely discharge home tomorrow to continue with home health services.  Patient is medically stable, Recommendations for discharge cardiovascular medications:  Amiodarone 200 mg daily, apixaban 5 mg bid, and rosuvastatin 20 mg daily Follow up with primary care in 7 to 10 days  Follow up renal function as outpatient.   Will sign off, please call if questions    Planned Discharge Destination: Home     Author: Tawni Millers, MD 06/03/2022 3:43 PM  For on call review [email or web address].

## 2022-06-03 NOTE — TOC Progression Note (Addendum)
Transition of Care [facility]) - Progression Note    Patient Details  Name: Tara [name] MRN: [id number] Date of Birth: 1946-04-10  Transition of Care [facility]) CM/SW [place], RN Phone Number:[phone]  06/03/2022, 9:49 AM  Clinical Narrative:   CM following for DME needs. CM has called [employer] to order DME. Currently there is no answer. Voicemail left . Will await return call.  1105 CM spoke with Lucrecia at [facility] to order DME supplies. DME ordered 3in 1 with drop arm, Hoyer lift with pad, steady, hospital bed, power lift chair, home O2, nebulizer machine, wheelchair & youth walker. Per [employer] the only equipment that patient will be eligible for insurance coverage is the nebulizer machine and hoyer lift. [employer] can pick up current hospital bed from the patients home and transport to daughters home.  3in 1 can not be covered due to patient just received BSC on 03/13/22, steady is not provided per Home DME company, wheelchair or youth walker can not be provided because the patient received a walker 03/03/21. Power lift chair is not covered for delivery family will need to come in to retail store and insurance will pay a portion for the [employer] but this is a process that must be handled in the retail store. Patient has not been wearing O2 and does not qualify for home O2 so insurance will not cover the cost. Pure wick is not covered through insurance but the rep  Corine Shelter is willing to send information to assist the family with self pay ordering of the pure wick.   1230 Cm just received call from [place] with [employer]. Patients Hoyer life DME requires a prior authorization and the order needs to be modified per the MD to read - patient requires periodic movement to affect improvement or further deterioration. Patients caregiver is unable to bodily transfer patient without lift.   [place] spoke with daughter [name] to make aware of above information regarding DME. Daughter verbalizes  understanding and is requesting to know if the family is allowed to trade in DME that has not been used. CM spoke with Lucrecia at [facility] and per Lucrecia these items are purchase items that have been billed and in the home so they will not be able to return the items. Joycelyn Das has also verified that [employer] does not have steady sit to stand lifts.   1400 CM called [facility] and they do not provide steady sit to stand lifts.  1430 CM has called daughter [name] and made her aware that steady is not available and that DME can not be returned. Daughter has been updated on all DME and states that she will research cost of the youth walker . Daughter has been contacted by [employer] to set up pickup of bed from patients home and delivery of the hospital bed to daughters home at [place]. [address] [place] [address]. Daughter was not given a time frame. CM called Lucrecia at [employer] to confirm when the bed will be delivered. Lucrecia does not have a time frame but will check and update CM. Will await return call.   [place] spoke with Lucrecia at [facility] who confirms that bed pick up and delivery  will not happen until tomorrow. [employer] will notify daughter when on the way for delivery. They do not have an expected time of delivery.   10 MD please enter Home health orders  Expected Discharge Plan: [place] Barriers to  Discharge: Other (must enter comment) (see CM documentation- waiting neuro MD clearence)  Expected Discharge Plan and Services Expected Discharge Plan: [place]   Discharge Planning Services: CM Consult Post Acute Care Choice: Durable Medical Equipment, Home Health Living arrangements for the past 2 months: [place]                 DME Arranged: 3-N-1, Hospital bed, Oxygen, Nebulizer/meds, Environmental consultant youth, Wheelchair manual, Other see comment (hoyer lift/stedy, lift chair)         HH Arranged: RN, PT, OT, Nurse's Aide HH Agency: [facility]         Social Determinants of Health (SDOH) Interventions    Readmission Risk Interventions    03/06/2022   12:36 PM  Readmission Risk Prevention Plan  Transportation Screening Complete  HRI or Home Care Consult Complete  Social Work Consult for [place] Planning/Counseling Complete

## 2022-06-03 NOTE — Progress Notes (Signed)
Patient ID: Tara [name], female   DOB: 1946-03-21, 76 y.o.   MRN: [id number] BP (!) 110/47 (BP Location: Left Arm)   Pulse 81   Temp 97.7 F (36.5 C) (Axillary)   Resp (!) 23   Ht '4\' 9"'$  (1.448 m)   Wt 74.7 kg   SpO2 92%   BMI 35.64 kg/m  Patient to be discharged once family receives equipment at home.  Responded to voice Moving all extremities

## 2022-06-03 NOTE — Progress Notes (Signed)
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 s/p tumor resection 7/3. Recent DC from [facility] 6/22. MRI on 7/26 shows: SDH extending inferiorly along the clivus and  narrowing the foramen magnum with possible mass effect. PMH: CKD stage 3, HFpEF, a fib, meningioma.    PT Comments    Patient not up this am as caregiver reports she threw up and was up part of the night with spouse here.  She roused when PT returned and participated well, though when attempting sit to stand to standard RW her L foot kept sliding forward.  Requested to caregiver here to speak with family to bring in shoes and likely clothing as well.  Patient progressing towards home and feel family will hopefully feel comfortable to take her home with follow up HHPT.    Recommendations for follow up therapy are one component of a multi-disciplinary discharge planning process, led by the attending physician.  Recommendations may be updated based on patient status, additional functional criteria and insurance authorization.  Follow Up Recommendations  Home health PT Can patient physically be transported by private vehicle: Yes   Assistance Recommended at Discharge Frequent or constant Supervision/Assistance  Patient can return home with the following A lot of help with bathing/dressing/bathroom;A lot of help with walking and/or transfers;Assist for transportation;Assistance with cooking/housework;Help with stairs or ramp for entrance;Direct supervision/assist for financial management;Direct supervision/assist for medications management   Equipment Recommendations  Wheelchair (measurements PT);Wheelchair cushion (measurements PT);Hospital bed;BSC/3in1;Rolling walker (2 wheels)    Recommendations for Other Services       Precautions / Restrictions  Precautions Precautions: Fall Other Brace: bilat resting hand splints,  ankle contracture boot, B Prafos/ankle contracture boot (wearing schedules posted in room)     Mobility  Bed Mobility Overal bed mobility: Needs Assistance Bed Mobility: Supine to Sit     Supine to sit: Mod assist, HOB elevated     General bed mobility comments: slow to initiate so assist for trunk, then leaning back some as well    Transfers Overall transfer level: Needs assistance Equipment used: Rolling walker (2 wheels) Transfers: Sit to/from Stand, Bed to chair/wheelchair/BSC Sit to Stand: Mod assist           General transfer comment: stood to RW but L foot kept sliding forward even after blocked for initial sit to stand, pt with hips posterior to COG and L LE sliding forward so returned to EOB and pt on very edge to to supine to use bed pan and once finished assisted to chair via stedy Transfer via Lift Equipment: Stedy  Ambulation/Gait                   Stairs             Wheelchair Mobility    Modified Rankin (Stroke Patients Only) Modified Rankin (Stroke Patients Only) Pre-Morbid Rankin Score: Moderately severe disability Modified Rankin: Severe disability     Balance Overall balance assessment: Needs assistance   Sitting balance-Leahy Scale: Fair Sitting balance - Comments: unless too close to EOB and pt leaning back with fear of falling   Standing balance support: Bilateral upper extremity supported Standing balance-Leahy Scale: Poor Standing balance comment: standing with RW L LE sliding forward, with stedy standing without UE support  Cognition Arousal/Alertness: Awake/alert Behavior During Therapy: Flat affect Overall Cognitive Status: Impaired/Different from baseline Area of Impairment: Attention, Following commands, Safety/judgement, Problem solving, Awareness                   Current Attention Level:  Sustained Memory: Decreased short-term memory Following Commands: Follows one step commands inconsistently, Follows one step commands with increased time   Awareness: Intellectual Problem Solving: Slow processing, Decreased initiation General Comments: sleepy initially, but roused with time and therex, repeating gibberish phrase, but able to talk over it when expressing herself        Exercises Other Exercises Other Exercises: AAROM UE's in supine and LE's in sitting    General Comments General comments (skin integrity, edema, etc.): Caregiver present (not family) and left room to get lunch, but returned during session.  Helpful to assist to scoot pt back on bed when requested, but despite talking about caregiver education as plan earlier today. she was not engaged.      Pertinent Vitals/Pain Pain Assessment Pain Score: 0-No pain    Home Living                          Prior Function            PT Goals (current goals can now be found in the care plan section) Progress towards PT goals: Progressing toward goals    Frequency    Min 3X/week      PT Plan Current plan remains appropriate    Co-evaluation              AM-PAC PT "6 Clicks" Mobility   Outcome Measure  Help needed turning from your back to your side while in a flat bed without using bedrails?: A Lot Help needed moving from lying on your back to sitting on the side of a flat bed without using bedrails?: A Lot Help needed moving to and from a bed to a chair (including a wheelchair)?: Total Help needed standing up from a chair using your arms (e.g., wheelchair or bedside chair)?: Total Help needed to walk in hospital room?: Total Help needed climbing 3-5 steps with a railing? : Total 6 Click Score: 8    End of Session Equipment Utilized During Treatment: Gait belt Activity Tolerance: Patient tolerated treatment well Patient left: in chair;with call bell/phone within reach;with  family/visitor present   PT Visit Diagnosis: Other abnormalities of gait and mobility (R26.89);Other symptoms and signs involving the nervous system (R29.898);Unsteadiness on feet (R26.81);Muscle weakness (generalized) (M62.81);Difficulty in walking, not elsewhere classified (R26.2)     Time: 6160-7371 PT Time Calculation (min) (ACUTE ONLY): 36 min  Charges:  $Therapeutic Exercise: 8-22 mins $Therapeutic Activity: 8-22 mins                     Magda Kiel, PT Acute Rehabilitation Services [phone] 06/03/2022    [name] 06/03/2022, 4:43 PM

## 2022-06-03 NOTE — Progress Notes (Signed)
Patients oxygen saturation is 94% on room air.

## 2022-06-03 NOTE — Progress Notes (Signed)
Mobility Specialist Progress Note   06/03/22 1559  Mobility  Activity Refused mobility   Pt difficult to arouse out of sleep to hold a full convo. Pt deferring mobility w/ max encouragement. Will f/u tomorrow if time permits.   Holland Falling Mobility Specialist MS [facility] #:  [phone] Acute Rehab Office:  [phone]

## 2022-06-04 NOTE — TOC Progression Note (Addendum)
Transition of Care [facility]) - Progression Note    Patient Details  Name: Tara [name] MRN: [id number] Date of Birth: [birth year]  Transition of Care [facility]) CM/SW [place], RN Phone Number:[phone]  06/04/2022, 11:23 AM  Clinical Narrative:    CM called daughter per daughters request/ CM spoke with Tara [name] who inquires about status of DME delivery. CM spoke with Henry at [facility]. Per Leda Gauze the driver has attempted to call for delivery this morning and there was no answer and driver was unable to leave a voicemail. Driver is currently on another delivery and will be able to get back with Tara [name] in approximately 30 minutes. CM made Tara [name] aware of this. Tara [name] is requesting that driver call her and she will relay the information to family members in order for them to be available for delivery. CM has called [employer] and updated Lucrecia to make driver aware to call Tara [name] at [phone]. TOC will continue to follow.  28 Daughter Tara [name] called  requesting that CM  follow up on medicaid application. Daughter has been made aware that CM does not complete the Medicaid application process but message will be sent to financial counselor. [place] also inquiring to know if medicare part D can be added to patients Medicare. Daughter has been advised that this is something that may happen during medicare enrollment process. CM is not able to update benefits.      Expected Discharge Plan: Skilled Nursing Facility Barriers to Discharge: Other (must enter comment) (see CM documentation- waiting neuro MD clearence)  Expected Discharge Plan and Services Expected Discharge Plan: [place]   Discharge Planning Services: CM Consult Post Acute Care Choice: Durable Medical Equipment, Home Health Living arrangements for the past 2 months: [place]                 DME Arranged: 3-N-1, Hospital bed, Oxygen, Nebulizer/meds, Environmental consultant youth, Wheelchair manual, Other see  comment (hoyer lift/stedy, lift chair)         HH Arranged: RN, PT, OT, Nurse's Aide HH Agency: [facility]         Social Determinants of Health (SDOH) Interventions    Readmission Risk Interventions    03/06/2022   12:36 PM  Readmission Risk Prevention Plan  Transportation Screening Complete  HRI or Home Care Consult Complete  Social Work Consult for [place] Planning/Counseling Complete

## 2022-06-04 NOTE — Progress Notes (Signed)
Patient ID: Tara [name], female   DOB: 01-06-1946, 76 y.o.   MRN: [id number] BP 131/65 (BP Location: Left Leg)   Pulse 74   Temp 97.8 F (36.6 C) (Oral)   Resp 20   Ht '4\' 9"'$  (1.448 m)   Wt 74.7 kg   SpO2 91%   BMI 35.64 kg/m  Alert and oriented x 4 Speech is clear Following commands Still awaiting equipment No changes

## 2022-06-04 NOTE — Progress Notes (Signed)
Nutrition Follow-up  DOCUMENTATION CODES:   Obesity unspecified  INTERVENTION:   Encourage PO intake.  Magic cup BID with meals to maximize protein intake, each supplement provides 290 kcal and 9 grams of protein.  Continue MVI with minerals daily.  Please re-consult as needed.  NUTRITION DIAGNOSIS:   Inadequate oral intake related to dysphagia as evidenced by  (altered texture diet; meal completion <75%). Progressing.   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  Plans for d/c home when equipment is delivered to home.  Patient currently on a regular diet with thin liquids and since diet advancement to regular patient has been consuming 100% of her meals.   Labs reviewed.  Medications reviewed and include MVI with minerals, Protonix, Senokot-S.  Diet Order:   Diet Order             Diet regular Room service appropriate? Yes with Assist; Fluid consistency: Thin  Diet effective now                   EDUCATION NEEDS:   Not appropriate for education at this time  Skin:  Skin Assessment: Skin Integrity Issues: Skin Integrity Issues:: Stage II Stage II: buttocks  Last BM:  9/12 type 6  Height:   Ht Readings from Last 1 Encounters:  03/15/22 _0  (1.448 m)    Weight:   Wt Readings from Last 1 Encounters:  05/06/22 74.7 kg    BMI:  Body mass index is 35.64 kg/m.  Estimated Nutritional Needs:   Kcal:  1500-1700  Protein:  85-100 grams  Fluid:  >1.5 L/day   Lucas Mallow RD, LDN, CNSC Please refer to Amion for contact information.

## 2022-06-04 NOTE — Progress Notes (Signed)
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 s/p tumor resection 7/3. Recent DC from [facility] 6/22. MRI on 7/26 shows: SDH extending inferiorly along the clivus and  narrowing the foramen magnum with possible mass effect. PMH: CKD stage 3, HFpEF, a fib, meningioma.    PT Comments    Patient able to ambulate to bathroom and then to doorway with eva walker and spouse assisting as well.  She fatigued and flexed upper trunk over walker, but stopped x 2 to correct posture.  Patient less verbal today.  Spouse to bring in shoes/clothes tomorrow.  PT will continue to follow acutely.   Recommendations for follow up therapy are one component of a multi-disciplinary discharge planning process, led by the attending physician.  Recommendations may be updated based on patient status, additional functional criteria and insurance authorization.  Follow Up Recommendations  Home health PT Can patient physically be transported by private vehicle: Yes   Assistance Recommended at Discharge Frequent or constant Supervision/Assistance  Patient can return home with the following A lot of help with bathing/dressing/bathroom;A lot of help with walking and/or transfers;Assist for transportation;Assistance with cooking/housework;Help with stairs or ramp for entrance;Direct supervision/assist for financial management;Direct supervision/assist for medications management   Equipment Recommendations  Wheelchair (measurements PT);Wheelchair cushion (measurements PT);Hospital bed;BSC/3in1;Rolling walker (2 wheels)    Recommendations for Other Services       Precautions / Restrictions Precautions Precautions: Fall Precaution Comments: seizures Required Braces or Orthoses: Other Brace Other Brace: bilat resting hand splints,  ankle contracture  boot, B Prafos/ankle contracture boot (wearing schedules posted in room)     Mobility  Bed Mobility Overal bed mobility: Needs Assistance Bed Mobility: Supine to Sit     Supine to sit: Mod assist, HOB elevated     General bed mobility comments: pulling up on therapist to scoot forward, assist to initiate moving legs off bed    Transfers Overall transfer level: Needs assistance Equipment used: Ambulation equipment used Transfers: Sit to/from Stand Sit to Stand: Min assist, +2 safety/equipment, Mod assist           General transfer comment: up to eva walker from EOB (at higher level to fit walker under the bed) then down and up from toilet in bathroom with mod A +2 for walker stability, spouse present and assisting as well; down to toilet and bed guiding/lowering help for safety and cues    Ambulation/Gait   Gait Distance (Feet): 12 Feet (& 20') Assistive device: Ethelene Hal Gait Pattern/deviations: Decreased stride length, Decreased step length - right, Decreased dorsiflexion - left, Decreased dorsiflexion - right, Wide base of support, Trunk flexed, Drifts right/left, Shuffle       General Gait Details: flexed upper trunk leaning over eva walker, cues for stepping and assist for managing walker; spouse at her side, then able to obtain recliner when pt needing to sit; first walked to bathroom due to had BM smear on bed, then walked to door   Stairs             Wheelchair Mobility    Modified Rankin (Stroke Patients Only) Modified Rankin (Stroke Patients Only) Pre-Morbid Rankin Score: Moderately severe disability Modified Rankin: Moderately severe disability     Balance Overall balance assessment: Needs assistance   Sitting balance-Leahy Scale: Fair     Standing balance support: Bilateral upper extremity supported  Standing balance-Leahy Scale: Zero Standing balance comment: assist for balance with UE support                             Cognition Arousal/Alertness: Awake/alert Behavior During Therapy: Flat affect Overall Cognitive Status: Impaired/Different from baseline Area of Impairment: Attention, Following commands, Safety/judgement, Problem solving, Awareness                   Current Attention Level: Sustained Memory: Decreased short-term memory Following Commands: Follows one step commands with increased time, Follows one step commands consistently Safety/Judgement: Decreased awareness of deficits, Decreased awareness of safety   Problem Solving: Slow processing, Decreased initiation          Exercises General Exercises - Lower Extremity Ankle Circles/Pumps: AAROM, Both, 15 reps, Seated    General Comments General comments (skin integrity, edema, etc.): Spouse present and assisting throughout for safety and to obtain recliner, even helped her with perineal hygiene while in bathroom and PT helping her with balance for standing; discussed follow up at d/c as he is trying to hire help during the day, but may be cost prohibitive.  States no equipment at the house yet.      Pertinent Vitals/Pain Pain Assessment Pain Score: 0-No pain    Home Living                          Prior Function            PT Goals (current goals can now be found in the care plan section) Progress towards PT goals: Progressing toward goals    Frequency    Min 4X/week      PT Plan Current plan remains appropriate;Frequency needs to be updated    Co-evaluation              AM-PAC PT "6 Clicks" Mobility   Outcome Measure  Help needed turning from your back to your side while in a flat bed without using bedrails?: A Lot Help needed moving from lying on your back to sitting on the side of a flat bed without using bedrails?: A Lot Help needed moving to and from a bed to a chair (including a wheelchair)?: Total Help needed standing up from a chair using your arms (e.g., wheelchair or bedside  chair)?: Total Help needed to walk in hospital room?: Total (2 person assist for safety) Help needed climbing 3-5 steps with a railing? : Total 6 Click Score: 8    End of Session Equipment Utilized During Treatment: Gait belt Activity Tolerance: Patient tolerated treatment well Patient left: in chair;with call bell/phone within reach;with family/visitor present Nurse Communication: Mobility status PT Visit Diagnosis: Other abnormalities of gait and mobility (R26.89);Other symptoms and signs involving the nervous system (R29.898);Unsteadiness on feet (R26.81);Muscle weakness (generalized) (M62.81);Difficulty in walking, not elsewhere classified (R26.2)     Time: 1025-1100 PT Time Calculation (min) (ACUTE ONLY): 35 min  Charges:  $Gait Training: 8-22 mins $Therapeutic Activity: 8-22 mins                     Magda Kiel, PT Acute Rehabilitation Services Office:[phone] 06/04/2022    Tara [name] 06/04/2022, 11:11 AM

## 2022-06-05 NOTE — Progress Notes (Signed)
Occupational Therapy Treatment Patient Details Name: Tara [name] MRN: [id number] DOB: 10-Aug-1946 Today's Date: 06/05/2022   History of present illness 76 yo woman admitted 03/15/22 with new onset seizures related to meningioma. s/p tumor resection 7/3. Recent DC from [facility] 6/22. MRI on 7/26 shows: SDH extending inferiorly along the clivus and  narrowing the foramen magnum with possible mass effect. PMH: CKD stage 3, HFpEF, a fib, meningioma.   OT comments  Aliani continues to make progress. Pt with increased spontaneous verbalizations and improved initiation. Able to ambulate to the bathroom and assist with pericare after toileting @ Ethelene Hal level. Husband and caregiver present. Plan is to DC home with 24/7 assistance. Recommend HHOT and [place]. Will continue to follow.    Recommendations for follow up therapy are one component of a multi-disciplinary discharge planning process, led by the attending physician.  Recommendations may be updated based on patient status, additional functional criteria and insurance authorization.    Follow Up Recommendations  Home health OT    Assistance Recommended at Discharge Frequent or constant Supervision/Assistance  Patient can return home with the following  Two people to help with walking and/or transfers;Two people to help with bathing/dressing/bathroom;Direct supervision/assist for medications management;Direct supervision/assist for financial management;Assist for transportation;Help with stairs or ramp for entrance;Assistance with feeding;Assistance with cooking/housework   Equipment Recommendations  BSC/3in1;Wheelchair (measurements OT);Wheelchair cushion (measurements OT);Hospital bed;Other (comment)    Recommendations for Other Services      Precautions / Restrictions Precautions Precautions: Fall Precaution Comments: seizures Required Braces or Orthoses: Other Brace Other Brace: bilat resting hand splints,  ankle contracture boot, B  Prafos/ankle contracture boot (wearing schedules posted in room) (encourage functioinal use of hands during the day and wear splints at night) Restrictions Weight Bearing Restrictions: No       Mobility Bed Mobility Overal bed mobility: Needs Assistance         Sit to supine: Mod assist        Transfers Overall transfer level: Needs assistance     Sit to Stand: Min assist, +2 safety/equipment           General transfer comment: using Harmon Pier walker     Balance     Sitting balance-Leahy Scale: Fair       Standing balance-Leahy Scale: Poor                             ADL either performed or assessed with clinical judgement   ADL   Eating/Feeding: Minimal assistance   Grooming: Moderate assistance   Upper Body Bathing: Maximal assistance               Toilet Transfer: Moderate assistance;+2 for safety/equipment;Ambulation (eva walker)   Toileting- Clothing Manipulation and Hygiene: Moderate assistance Toileting - Clothing Manipulation Details (indicate cue type and reason): cues for sequening and physical assist to wipe; significant posterioro lean when standing to complete pericare     Functional mobility during ADLs: Moderate assistance;+2 for safety/equipment      Extremity/Trunk Assessment Upper Extremity Assessment RUE Deficits / Details: keeping hand fisted byt attempting to use funcitonally; used hand to help with pericatre RUE Coordination: decreased fine motor;decreased gross motor LUE Deficits / Details: increased spontaneous movement LUE Coordination: decreased fine motor   Lower Extremity Assessment Lower Extremity Assessment: Defer to PT evaluation        Vision   Vision Assessment?: Vision impaired- to be further tested in functional context  Perception     Praxis      Cognition Arousal/Alertness: Awake/alert Behavior During Therapy: Flat affect, Anxious Overall Cognitive Status: Impaired/Different from  baseline Area of Impairment: Attention, Orientation, Memory, Following commands, Safety/judgement, Awareness, Problem solving                 Orientation Level: Disoriented to, Time Current Attention Level: Sustained     Safety/Judgement: Decreased awareness of safety, Decreased awareness of deficits Awareness: Emergent Problem Solving: Slow processing, Decreased initiation, Difficulty sequencing, Requires verbal cues, Requires tactile cues          Exercises General Exercises - Upper Extremity Composite Extension: Right, 10 reps Other Exercises Other Exercises: educated husband on stretching out R hand, incluign thumb into composite extension    Shoulder Instructions       General Comments      Pertinent Vitals/ Pain       Pain Assessment Pain Assessment: Faces Faces Pain Scale: Hurts a little bit Pain Location: generalized Pain Descriptors / Indicators: Grimacing Pain Intervention(s): Limited activity within patient's tolerance  Home Living                                          Prior Functioning/Environment              Frequency  Min 2X/week        Progress Toward Goals  OT Goals(current goals can now be found in the care plan section)  Progress towards OT goals: Progressing toward goals  Acute Rehab OT Goals Patient Stated Goal: to get better OT Goal Formulation: With patient/family Time For Goal Achievement: 06/12/22 Potential to Achieve Goals: Fair ADL Goals Pt Will Perform Grooming: with mod assist;sitting Pt Will Perform Upper Body Bathing: with mod assist;sitting Pt Will Perform Lower Body Bathing: with max assist;sit to/from stand Pt Will Perform Lower Body Dressing: with modified independence;sitting/lateral leans;sit to/from stand Pt Will Transfer to Toilet: with min assist;with +2 assist;bedside commode;stand pivot transfer Pt Will Perform Toileting - Clothing Manipulation and hygiene: with modified  independence;sitting/lateral leans;sit to/from stand Additional ADL Goal #1: Pt will track to midline to find family members with moderate directional cues and max assist to turn head Additional ADL Goal #2: Pt will follow simple commands 75% of time during ADL Additional ADL Goal #3: Ptwill maintain midlien posutral control EOB with min A in preparation for ADL tasks Additional ADL Goal #4: pt will tolerate B resting hand splints at nights to prevent further flexor tightness B hands  Plan Discharge plan remains appropriate;Frequency remains appropriate    Co-evaluation                 AM-PAC OT "6 Clicks" Daily Activity     Outcome Measure   Help from another person eating meals?: A Lot Help from another person taking care of personal grooming?: A Lot Help from another person toileting, which includes using toliet, bedpan, or urinal?: A Lot Help from another person bathing (including washing, rinsing, drying)?: A Lot Help from another person to put on and taking off regular upper body clothing?: A Lot Help from another person to put on and taking off regular lower body clothing?: Total 6 Click Score: 11    End of Session Equipment Utilized During Treatment: Gait belt;Other (comment) (EVA walker)  OT Visit Diagnosis: Unsteadiness on feet (R26.81);Other abnormalities of gait and mobility (R26.89);Muscle weakness (generalized) (  M62.81);Low vision, both eyes (H54.2);Other symptoms and signs involving cognitive function;Pain Pain - part of body:  (generalized)   Activity Tolerance Patient tolerated treatment well   Patient Left in bed;with call bell/phone within reach;with bed alarm set   Nurse Communication Mobility status        Time: 2956-2130 OT Time Calculation (min): 23 min  Charges: OT General Charges $OT Visit: 1 Visit OT Treatments $Self Care/Home Management : 23-37 mins  Maurie Boettcher, OT/L   Acute OT Clinical Specialist [place] Pager  [phone] Office [phone]   [facility] 06/05/2022, 1:24 PM

## 2022-06-05 NOTE — Progress Notes (Signed)
Physical Therapy Treatment Patient Details Name: Tara [name] MRN: [id number] DOB: 1946/04/07 Today's Date: 06/05/2022   History of Present Illness 76 yo woman admitted 03/15/22 with new onset seizures related to meningioma. s/p tumor resection 7/3. Recent DC from [facility] 6/22. MRI on 7/26 shows: SDH extending inferiorly along the clivus and  narrowing the foramen magnum with possible mass effect. PMH: CKD stage 3, HFpEF, a fib, meningioma.    PT Comments    The pt was agreeable to session with focus on continued mobility progression and endurance training. She was able to make great progress with hallway ambulation, but continues to need significant assist to rise to standing and steady with both static and dynamic stance, even with BUE support. The pt needed significant assist to maintain stability with gait in addition to assist with managing eva walker. The pt was appropriately able to state when she needed to return to room for rest. Was able to answer simple questions, perseverating on "my sister" throughout session.     Recommendations for follow up therapy are one component of a multi-disciplinary discharge planning process, led by the attending physician.  Recommendations may be updated based on patient status, additional functional criteria and insurance authorization.  Follow Up Recommendations  Home health PT Can patient physically be transported by private vehicle: Yes   Assistance Recommended at Discharge Frequent or constant Supervision/Assistance  Patient can return home with the following A lot of help with bathing/dressing/bathroom;A lot of help with walking and/or transfers;Assist for transportation;Assistance with cooking/housework;Help with stairs or ramp for entrance;Direct supervision/assist for financial management;Direct supervision/assist for medications management   Equipment Recommendations  Wheelchair (measurements PT);Wheelchair cushion (measurements PT);Hospital  bed;BSC/3in1;Rolling walker (2 wheels)    Recommendations for Other Services       Precautions / Restrictions Precautions Precautions: Fall Precaution Comments: seizures Required Braces or Orthoses: Other Brace Other Brace: bilat resting hand splints,  ankle contracture boot, B Prafos/ankle contracture boot (wearing schedules posted in room) (encourage functioinal use of hands during the day and wear splints at night) Restrictions Weight Bearing Restrictions: No     Mobility  Bed Mobility Overal bed mobility: Needs Assistance Bed Mobility: Supine to Sit, Sit to Supine     Supine to sit: Mod assist Sit to supine: Mod assist   General bed mobility comments: modA to move BLE to EOB and to pull on therapist to reach sitting. modA to return BLE to bed    Transfers Overall transfer level: Needs assistance Equipment used: Bilateral platform walker Transfers: Sit to/from Stand Sit to Stand: Mod assist           General transfer comment: using Harmon Pier walker    Ambulation/Gait Ambulation/Gait assistance: Mod assist Gait Distance (Feet): 75 Feet Assistive device: Ethelene Hal Gait Pattern/deviations: Decreased stride length, Decreased step length - right, Decreased dorsiflexion - left, Decreased dorsiflexion - right, Wide base of support, Trunk flexed, Drifts right/left, Shuffle, Scissoring Gait velocity: very slow Gait velocity interpretation: <1.8 ft/sec, indicate of risk for recurrent falls   General Gait Details: small narrow steps with minimal stride clearance. significant postural sway needing modA and modA to control movement of eva walker    Modified Rankin (Stroke Patients Only) Modified Rankin (Stroke Patients Only) Pre-Morbid Rankin Score: Moderately severe disability Modified Rankin: Moderately severe disability     Balance Overall balance assessment: Needs assistance Sitting-balance support: Feet supported, Single extremity supported, Bilateral upper extremity  supported Sitting balance-Leahy Scale: Fair     Standing balance support: Bilateral  upper extremity supported Standing balance-Leahy Scale: Poor Standing balance comment: assist for balance with UE support                            Cognition Arousal/Alertness: Awake/alert Behavior During Therapy: Flat affect, Anxious Overall Cognitive Status: Impaired/Different from baseline Area of Impairment: Attention, Orientation, Memory, Following commands, Safety/judgement, Awareness, Problem solving                 Orientation Level: Disoriented to, Time Current Attention Level: Sustained     Safety/Judgement: Decreased awareness of safety, Decreased awareness of deficits Awareness: Emergent Problem Solving: Slow processing, Decreased initiation, Difficulty sequencing, Requires verbal cues, Requires tactile cues General Comments: pt able to follow cues and answer questions with slightly increased time. able to express a few needs without prompting. perseverating on "my sister" through session        Exercises      General Comments General comments (skin integrity, edema, etc.): HR 90-100 with SpO2 low of 90% on RA with exertion      Pertinent Vitals/Pain Pain Assessment Pain Assessment: Faces Faces Pain Scale: Hurts a little bit Pain Location: generalized Pain Descriptors / Indicators: Grimacing Pain Intervention(s): Limited activity within patient's tolerance, Monitored during session, Repositioned     PT Goals (current goals can now be found in the care plan section) Acute Rehab PT Goals Patient Stated Goal: to go home PT Goal Formulation: With patient Time For Goal Achievement: 06/09/22 Potential to Achieve Goals: Fair Progress towards PT goals: Progressing toward goals    Frequency    Min 4X/week      PT Plan Current plan remains appropriate;Frequency needs to be updated       AM-PAC PT "6 Clicks" Mobility   Outcome Measure  Help needed  turning from your back to your side while in a flat bed without using bedrails?: A Lot Help needed moving from lying on your back to sitting on the side of a flat bed without using bedrails?: A Lot Help needed moving to and from a bed to a chair (including a wheelchair)?: A Lot Help needed standing up from a chair using your arms (e.g., wheelchair or bedside chair)?: A Lot Help needed to walk in hospital room?: Total Help needed climbing 3-5 steps with a railing? : Total 6 Click Score: 10    End of Session Equipment Utilized During Treatment: Gait belt Activity Tolerance: Patient tolerated treatment well Patient left: in bed;with call bell/phone within reach;with bed alarm set;with family/visitor present (in chair position in bed) Nurse Communication: Mobility status PT Visit Diagnosis: Other abnormalities of gait and mobility (R26.89);Other symptoms and signs involving the nervous system (R29.898);Unsteadiness on feet (R26.81);Muscle weakness (generalized) (M62.81);Difficulty in walking, not elsewhere classified (R26.2)     Time: 2500-3704 PT Time Calculation (min) (ACUTE ONLY): 21 min  Charges:  $Gait Training: 8-22 mins                     West Carbo, PT, DPT   Acute Rehabilitation Department   Sandra Cockayne 06/05/2022, 5:14 PM

## 2022-06-05 NOTE — TOC Progression Note (Addendum)
Transition of Care [facility]) - Progression Note    Patient Details  Name: Tara [name] MRN: [id number] Date of Birth: 1945-10-02  Transition of Care [facility]) CM/SW [place], RN Phone [phone]  06/05/2022, 9:19 AM  Clinical Narrative:    CM called Tara [name] with [employer] to determine if DME has been delivered. Per Thedore Mins DME including bed nebulizer machine hoyer lift were delivered to the residence of daughter Tara [name] Address: [place], [place] [address] on 9/13 @ 12:50pm. CM will call daughter to confirm that DME was delivered.   Deaf Smith spoke with Daughter Tara [name] who confirms that DME was delivered yesterday 06/04/22. Daughter states that bed that was delivered to her home had short bed rails and her mothers previous bed had long rails. Daughter states that she was unable to reach out to [employer] due to after hours but was told that the delivery guy pulled a bed from the warehouse and a different guy picked up the bed from the patients residence. CM called Tara [name] to make him aware that bed rails are needed ASAP. Thedore Mins states that he is creating a ticket and will have the rails delivered today. MD will need to enter home health orders for home health set up.   1442 Daughter called CM to inquire about delivery of full bed rails. CM followed up with Lucrecia at [facility] who states that the order has been received and the delivery is in que but no other details. CM notified Tara [name] with [employer] for follow up with daughter.   [place] with [facility] confirms that patient is next on list for delivery of full bed rails and driver has number for daughter Tara [name].   1625 CM received called from daughter Tara [name] to make CM aware that Tara [name] has received the full bed rails and all other needed DME for home.  Tara [name] is requesting for am discharge and CM made her aware that CM will enter note. Daughter is also requesting that MD place a consult for [facility]  inpatient rehab in [place]  and [facility]. Daughter states that [facility] can admit from home and she will be able to follow up with them from home but she is not sure about [facility]. [place] also inquiring about medicaid application. Daughter has been made aware that financial counselors have been made aware. No other needs at this time.     Expected Discharge Plan: Skilled Nursing Facility Barriers to Discharge: Other (must enter comment) (see CM documentation- waiting neuro MD clearence)  Expected Discharge Plan and Services Expected Discharge Plan: [place]   Discharge Planning Services: CM Consult Post Acute Care Choice: Durable Medical Equipment, Home Health Living arrangements for the past 2 months: [place]                 DME Arranged: 3-N-1, Hospital bed, Oxygen, Nebulizer/meds, Environmental consultant youth, Wheelchair manual, Other see comment (hoyer lift/stedy, lift chair)         HH Arranged: RN, PT, OT, Nurse's Aide HH Agency: [facility]         Social Determinants of Health (SDOH) Interventions    Readmission Risk Interventions    03/06/2022   12:36 PM  Readmission Risk Prevention Plan  Transportation Screening Complete  HRI or Home Care Consult Complete  Social Work Consult for [place] Planning/Counseling Complete

## 2022-06-06 NOTE — Progress Notes (Signed)
Patient ID: Tara [name], female   DOB: [birth year], 76 y.o.   MRN: [id number] BP (!) 100/59 (BP Location: Left Leg)   Pulse 76   Temp 98.6 F (37 C) (Oral)   Resp 18   Ht '4\' 9"'$  (1.448 m)   Wt 74.7 kg   SpO2 93%   BMI 35.64 kg/m  Home tomorrow All equipment has been delivered Wound is clean, dry

## 2022-06-06 NOTE — TOC Progression Note (Signed)
Transition of Care [facility]) - Progression Note    Patient Details  Name: Tara [name] MRN: [id number] Date of Birth: 1946/03/23  Transition of Care [facility]) CM/SW [place], RN Phone Number:[phone]  06/06/2022, 4:26 PM  Clinical Narrative:    All DME has been delivered since yesterday 9/14. Patient is ready for d/c from [facility]  needs. MD needs to enter [facility] order prior to d.c. Referral has been called to [facility]. Please update [facility] when discharged   Expected Discharge Plan: Skilled Nursing Facility Barriers to Discharge: Other (must enter comment) (see CM documentation- waiting neuro MD clearence)  Expected Discharge Plan and Services Expected Discharge Plan: [place]   Discharge Planning Services: CM Consult Post Acute Care Choice: Durable Medical Equipment, Home Health Living arrangements for the past 2 months: [place]                 DME Arranged: 3-N-1, Hospital bed, Oxygen, Nebulizer/meds, Environmental consultant youth, Wheelchair manual, Other see comment (hoyer lift/stedy, lift chair)         HH Arranged: RN, PT, OT, Nurse's Aide HH Agency: [facility]         Social Determinants of Health (SDOH) Interventions    Readmission Risk Interventions    03/06/2022   12:36 PM  Readmission Risk Prevention Plan  Transportation Screening Complete  HRI or Home Care Consult Complete  Social Work Consult for [place] Planning/Counseling Complete

## 2022-06-06 NOTE — Progress Notes (Signed)
Mobility Specialist Progress Note   06/06/22 1527  Mobility  Activity Stood at bedside  Level of Assistance Minimal assist, patient does 75% or more  Assistive Device Stedy  Activity Response Tolerated well  $Mobility charge 1 Mobility   Pre Mobility: 74 HR, 98% SpO2 During Mobility: 84 HR, 94% SpO2 Post Mobility: 73 HR, BP, 96% SpO2  Received pt in bed eager for mobility. x10 STS w/ x4 seated rest breaks d/t fatigue. Pt requiring mod cues for sequencing throughout but no faults during session. Returend back supine w/ call bell in reach and all needs met.    Holland Falling Mobility Specialist MS [facility] #:  [phone] Acute Rehab Office:  [phone]

## 2022-06-06 NOTE — Care Management Important Message (Signed)
Important Message  Patient Details  Name: Tara [name] MRN: [id number] Date of Birth: 05-21-1946   Medicare Important Message Given:  Yes     Hannah Beat 06/06/2022, 12:09 PM

## 2022-06-06 NOTE — Progress Notes (Signed)
Physical Therapy Treatment Patient Details Name: Tara [name] MRN: [id number] DOB: 02-20-1946 Today's Date: 06/06/2022   History of Present Illness 76 yo woman admitted 03/15/22 with new onset seizures related to meningioma. s/p tumor resection 7/3. Recent DC from [facility] 6/22. MRI on 7/26 shows: SDH extending inferiorly along the clivus and  narrowing the foramen magnum with possible mass effect. PMH: CKD stage 3, HFpEF, a fib, meningioma.    PT Comments    The pt was agreeable to session and eager to mobilize. Was able to practice multiple sit-stand transfers from EOB and recliner with increased time to rise but minA-modA to complete with pt pulling on RW. The pt was then able to progress hallway ambulation distance but continues to need significant assist for stability and management of eva walker. She takes short strides with narrow BOS and frequent knee hyperextension but is able to maintain upright with bilateral UE support and modA. Continue to recommend d/c home with frequent family assist. Husband present and in agreement with plan.    Recommendations for follow up therapy are one component of a multi-disciplinary discharge planning process, led by the attending physician.  Recommendations may be updated based on patient status, additional functional criteria and insurance authorization.  Follow Up Recommendations  Home health PT Can patient physically be transported by private vehicle: Yes   Assistance Recommended at Discharge Frequent or constant Supervision/Assistance  Patient can return home with the following A lot of help with bathing/dressing/bathroom;A lot of help with walking and/or transfers;Assist for transportation;Assistance with cooking/housework;Help with stairs or ramp for entrance;Direct supervision/assist for financial management;Direct supervision/assist for medications management   Equipment Recommendations  Wheelchair (measurements PT);Wheelchair cushion  (measurements PT);Hospital bed;BSC/3in1;Rolling walker (2 wheels)    Recommendations for Other Services       Precautions / Restrictions Precautions Precautions: Fall Precaution Comments: seizures Required Braces or Orthoses: Other Brace Other Brace: bilat resting hand splints,  ankle contracture boot, B Prafos/ankle contracture boot (wearing schedules posted in room) (encourage functioinal use of hands during the day and wear splints at night) Restrictions Weight Bearing Restrictions: No     Mobility  Bed Mobility Overal bed mobility: Needs Assistance       Supine to sit: Min assist     General bed mobility comments: minA with increased time and cues to complete bed mobility. cues to scoot and minA to complete.    Transfers Overall transfer level: Needs assistance Equipment used: Bilateral platform walker Transfers: Sit to/from Stand Sit to Stand: Mod assist           General transfer comment: using Harmon Pier walker    Ambulation/Gait Ambulation/Gait assistance: Mod assist Gait Distance (Feet): 85 Feet Assistive device: Ethelene Hal Gait Pattern/deviations: Decreased stride length, Decreased step length - right, Decreased dorsiflexion - left, Decreased dorsiflexion - right, Wide base of support, Trunk flexed, Drifts right/left, Shuffle, Scissoring Gait velocity: very slow Gait velocity interpretation: <1.31 ft/sec, indicative of household ambulator   General Gait Details: small narrow steps with minimal stride clearance. significant postural sway needing modA and modA to control movement of eva walker    Modified Rankin (Stroke Patients Only) Modified Rankin (Stroke Patients Only) Pre-Morbid Rankin Score: Moderately severe disability Modified Rankin: Moderately severe disability     Balance Overall balance assessment: Needs assistance Sitting-balance support: Feet supported, Single extremity supported, Bilateral upper extremity supported Sitting balance-Leahy  Scale: Fair     Standing balance support: Bilateral upper extremity supported Standing balance-Leahy Scale: Poor Standing balance comment: assist  for balance with UE support                            Cognition Arousal/Alertness: Awake/alert Behavior During Therapy: Flat affect, Anxious Overall Cognitive Status: Impaired/Different from baseline Area of Impairment: Attention, Orientation, Memory, Following commands, Safety/judgement, Awareness, Problem solving                 Orientation Level: Disoriented to, Time Current Attention Level: Sustained     Safety/Judgement: Decreased awareness of safety, Decreased awareness of deficits Awareness: Emergent Problem Solving: Slow processing, Decreased initiation, Difficulty sequencing, Requires verbal cues, Requires tactile cues General Comments: pt able to follow cues and answer questions with slightly increased time. able to express a few needs without prompting. perseverating on "my sister" through session        Exercises      General Comments General comments (skin integrity, edema, etc.): HR 70-98bpm      Pertinent Vitals/Pain Pain Assessment Pain Assessment: Faces Faces Pain Scale: Hurts a little bit Pain Location: generalized Pain Descriptors / Indicators: Grimacing Pain Intervention(s): Monitored during session           PT Goals (current goals can now be found in the care plan section) Acute Rehab PT Goals Patient Stated Goal: to go home PT Goal Formulation: With patient Time For Goal Achievement: 06/09/22 Potential to Achieve Goals: Fair Progress towards PT goals: Progressing toward goals    Frequency    Min 4X/week      PT Plan Current plan remains appropriate;Frequency needs to be updated       AM-PAC PT "6 Clicks" Mobility   Outcome Measure  Help needed turning from your back to your side while in a flat bed without using bedrails?: A Lot Help needed moving from lying on your  back to sitting on the side of a flat bed without using bedrails?: A Lot Help needed moving to and from a bed to a chair (including a wheelchair)?: A Lot Help needed standing up from a chair using your arms (e.g., wheelchair or bedside chair)?: A Lot Help needed to walk in hospital room?: Total Help needed climbing 3-5 steps with a railing? : Total 6 Click Score: 10    End of Session Equipment Utilized During Treatment: Gait belt Activity Tolerance: Patient tolerated treatment well Patient left: in bed;with call bell/phone within reach;with bed alarm set;with family/visitor present (in chair position in bed) Nurse Communication: Mobility status PT Visit Diagnosis: Other abnormalities of gait and mobility (R26.89);Other symptoms and signs involving the nervous system (R29.898);Unsteadiness on feet (R26.81);Muscle weakness (generalized) (M62.81);Difficulty in walking, not elsewhere classified (R26.2)     Time: 2263-3354 PT Time Calculation (min) (ACUTE ONLY): 29 min  Charges:  $Gait Training: 8-22 mins $Therapeutic Activity: 8-22 mins                     West Carbo, PT, DPT   Acute Rehabilitation Department   Sandra Cockayne 06/06/2022, 9:52 AM

## 2022-06-07 MED ORDER — LACOSAMIDE 200 MG PO TABS: 200.0000 mg | ORAL_TABLET | Freq: Two times a day (BID) | ORAL | 1 refills | Status: DC

## 2022-06-07 MED ORDER — APIXABAN 5 MG PO TABS: 5.0000 mg | ORAL_TABLET | Freq: Two times a day (BID) | ORAL | 1 refills | Status: DC

## 2022-06-07 MED ORDER — LEVETIRACETAM 750 MG PO TABS: 750.0000 mg | ORAL_TABLET | Freq: Two times a day (BID) | ORAL | 1 refills | Status: DC

## 2022-06-07 NOTE — TOC Progression Note (Signed)
Transition of Care [facility]) - Progression Note    Patient Details  Name: Tara [name] MRN: [id number] Date of Birth: 1946-02-11  [facility] CM/SW Contact  Bartholomew Crews, RN Phone Number: [phone] 06/07/2022, 4:24 PM  Clinical Narrative:     Spoke with patient and spouse at the bedside to discuss post acute transition. Advised that patient had been making increased progress with PT going from not walking to ambulating 85' this week. Pending PT/OT evaluations for updated recommendations. Per Anderson Malta at [facility] not opened on weekends, so Josem Kaufmann will be initiated on Monday. Spouse is ready for patient to return home with him, but stated that he knows she needs more rehab before being ready to return home. TOC following for transition needs.   Expected Discharge Plan: Skilled Nursing Facility Barriers to Discharge: Other (must enter comment) (see CM documentation- waiting neuro MD clearence)  Expected Discharge Plan and Services Expected Discharge Plan: [place]   Discharge Planning Services: CM Consult Post Acute Care Choice: Durable Medical Equipment, Home Health Living arrangements for the past 2 months: Single Family Home Expected Discharge Date: 06/07/22               DME Arranged: 3-N-1, Hospital bed, Oxygen, Nebulizer/meds, Walker youth, Wheelchair manual, Other see comment (hoyer lift/stedy, lift chair)         HH Arranged: RN, PT, OT, Nurse's Aide HH Agency: [place]         Social Determinants of Health (SDOH) Interventions    Readmission Risk Interventions    03/06/2022   12:36 PM  Readmission Risk Prevention Plan  Transportation Screening Complete  HRI or [place] Complete  Social Work Consult for [place] Planning/Counseling Complete

## 2022-06-07 NOTE — Progress Notes (Signed)
Physical Therapy Treatment Patient Details Name: Tara [name] MRN: [id number] DOB: 1945/11/25 Today's Date: 06/07/2022   History of Present Illness 76 yo woman admitted 03/15/22 with new onset seizures related to meningioma. s/p tumor resection 7/3. Recent DC from [facility] 6/22. MRI on 7/26 shows: SDH extending inferiorly along the clivus and  narrowing the foramen magnum with possible mass effect. PMH: CKD stage 3, HFpEF, a fib, meningioma.    PT Comments    Pt is continuing to demonstrate good, steady progress with PT. She ambulated x2 bouts of ~80 ft the first bout then ~50 ft the second bout utilizing an Education officer, community. She displays deficits in her core/trunk and bil lower extremity strength as she displays a flexed posture that increases with fatigue and poor feet clearance when stepping. She also has balance deficits and needs cues to widen her BOS to improve her stability. However, she tends to step/drift laterally and needs cues to direct her to step anteriorly majority of the time. As pt is demonstrating consistent progress with PT and can tolerate 3 hours of therapy a day now, updated recs to [facility]. Will continue to follow acutely.    Recommendations for follow up therapy are one component of a multi-disciplinary discharge planning process, led by the attending physician.  Recommendations may be updated based on patient status, additional functional criteria and insurance authorization.  Follow Up Recommendations  Acute inpatient rehab (3hours/day) Can patient physically be transported by private vehicle: Yes   Assistance Recommended at Discharge Frequent or constant Supervision/Assistance  Patient can return home with the following A lot of help with bathing/dressing/bathroom;A lot of help with walking and/or transfers;Assist for transportation;Assistance with cooking/housework;Help with stairs or ramp for entrance;Direct supervision/assist for financial management;Direct  supervision/assist for medications management   Equipment Recommendations  Wheelchair (measurements PT);Wheelchair cushion (measurements PT);Hospital bed;BSC/3in1;Rolling walker (2 wheels)    Recommendations for Other Services Rehab consult     Precautions / Restrictions Precautions Precautions: Fall Precaution Comments: seizures Required Braces or Orthoses: Other Brace Other Brace: bilat resting hand splints,  ankle contracture boot, B Prafos/ankle contracture boot (wearing schedules posted in room) (encourage functioinal use of hands during the day and wear splints at night) Restrictions Weight Bearing Restrictions: No     Mobility  Bed Mobility Overal bed mobility: Needs Assistance Bed Mobility: Supine to Sit, Sit to Supine     Supine to sit: Min assist, HOB elevated Sit to supine: Mod assist   General bed mobility comments: Min A for elevating trunk. Mod A for managing trunk and legs back to supine.    Transfers Overall transfer level: Needs assistance Equipment used: Bilateral platform walker Transfers: Sit to/from Stand Sit to Stand: Mod assist           General transfer comment: Mod A to power up and gain balance using Harmon Pier walker    Ambulation/Gait Ambulation/Gait assistance: Mod assist, +2 safety/equipment Gait Distance (Feet): 80 Feet (x2 bouts of ~80 ft > ~50 ft) Assistive device: Ethelene Hal Gait Pattern/deviations: Decreased stride length, Decreased step length - right, Decreased dorsiflexion - left, Decreased dorsiflexion - right, Trunk flexed, Drifts right/left, Shuffle, Scissoring, Narrow base of support Gait velocity: reduced Gait velocity interpretation: <1.31 ft/sec, indicative of household ambulator   General Gait Details: small narrow steps with minimal stride clearance. significant postural sway needing modA and modA to control movement of eva walker. Tactile and verbal cues provided to widen stance and improve foot clearance, min momentary  success. As pt  fatigued, her trunk flexion increased.   Stairs             Wheelchair Mobility    Modified Rankin (Stroke Patients Only) Modified Rankin (Stroke Patients Only) Pre-Morbid Rankin Score: Moderately severe disability Modified Rankin: Moderately severe disability     Balance Overall balance assessment: Needs assistance Sitting-balance support: Feet supported, Single extremity supported, Bilateral upper extremity supported Sitting balance-Leahy Scale: Fair     Standing balance support: Bilateral upper extremity supported Standing balance-Leahy Scale: Poor Standing balance comment: assist for balance with UE support                            Cognition Arousal/Alertness: Awake/alert Behavior During Therapy: Flat affect, Anxious Overall Cognitive Status: Impaired/Different from baseline Area of Impairment: Attention, Orientation, Memory, Following commands, Safety/judgement, Awareness, Problem solving                 Orientation Level: Disoriented to, Time Current Attention Level: Sustained Memory: Decreased short-term memory Following Commands: Follows one step commands with increased time, Follows one step commands consistently Safety/Judgement: Decreased awareness of safety, Decreased awareness of deficits Awareness: Emergent Problem Solving: Slow processing, Decreased initiation, Difficulty sequencing, Requires verbal cues, Requires tactile cues General Comments: Verbalizing yes/no questions. Perseverating on word "sister". Requiring increased encouragement throughout        Exercises      General Comments General comments (skin integrity, edema, etc.): husband present throughout      Pertinent Vitals/Pain Pain Assessment Pain Assessment: Faces Faces Pain Scale: Hurts a little bit Pain Location: generalized Pain Descriptors / Indicators: Grimacing Pain Intervention(s): Limited activity within patient's tolerance, Monitored  during session, Repositioned    Home Living                          Prior Function            PT Goals (current goals can now be found in the care plan section) Acute Rehab PT Goals Patient Stated Goal: to get better PT Goal Formulation: With patient/family Time For Goal Achievement: 06/09/22 Potential to Achieve Goals: Fair Progress towards PT goals: Progressing toward goals    Frequency    Min 4X/week      PT Plan Discharge plan needs to be updated    Co-evaluation PT/OT/SLP Co-Evaluation/Treatment: Yes Reason for Co-Treatment: For patient/therapist safety;To address functional/ADL transfers PT goals addressed during session: Mobility/safety with mobility;Balance;Proper use of DME OT goals addressed during session: ADL's and self-care      AM-PAC PT "6 Clicks" Mobility   Outcome Measure  Help needed turning from your back to your side while in a flat bed without using bedrails?: A Little Help needed moving from lying on your back to sitting on the side of a flat bed without using bedrails?: A Little Help needed moving to and from a bed to a chair (including a wheelchair)?: A Lot Help needed standing up from a chair using your arms (e.g., wheelchair or bedside chair)?: A Lot Help needed to walk in hospital room?: A Lot Help needed climbing 3-5 steps with a railing? : Total 6 Click Score: 13    End of Session Equipment Utilized During Treatment: Gait belt Activity Tolerance: Patient tolerated treatment well Patient left: in bed;with bed alarm set;with call bell/phone within reach;with family/visitor present Nurse Communication: Mobility status PT Visit Diagnosis: Other abnormalities of gait and mobility (R26.89);Other symptoms and signs involving the  nervous system (R29.898);Unsteadiness on feet (R26.81);Muscle weakness (generalized) (M62.81);Difficulty in walking, not elsewhere classified (R26.2)     Time: 2542-7062 PT Time Calculation (min) (ACUTE  ONLY): 31 min  Charges:  $Gait Training: 8-22 mins                     Moishe Spice, PT, DPT Acute Rehabilitation Services  Office: [phone]    Orvan Falconer 06/07/2022, 5:25 PM

## 2022-06-07 NOTE — Progress Notes (Signed)
OT Cancellation Note  Patient Details Name: Tara [name] MRN: [id number] DOB: [birth year]   Cancelled Treatment:    Reason Eval/Treat Not Completed: Other (comment) (Eating lunch. Will return as schedule allows.)  [place], OTR/L Acute Rehab Office: [phone] 06/07/2022, 1:47 PM

## 2022-06-07 NOTE — Discharge Summary (Signed)
Physician Discharge Summary  Patient ID: Tara [name] MRN: [id number] DOB/AGE: 76-Nov-1947 76 y.o.  Admit date: 03/15/2022 Discharge date: 06/07/2022  Admission Diagnoses: meningioma   Discharge Diagnoses: same   Discharged Condition: stable  Hospital Course: The patient was admitted on 03/15/2022 and taken to the operating room where the patient underwent craniotomy for tumor. The patient tolerated the procedure well and was taken to the recovery room and then to the ICU in stable condition. The wound remained clean dry and intact. Unfortunately the pt had a long and complicated hospital course. She had slow recovery, and had respiratory failure, leukocytosis, feeding issues, and lethargy. The patient continued to increase activities, and pain was well controlled. She was finally stable for D/C.   Consults: pulmonary/intensive care and neurology and medicine  Significant Diagnostic Studies:  Results for orders placed or performed during the hospital encounter of 03/15/22  Culture, blood (Routine X 2) w Reflex to ID Panel   Specimen: BLOOD  Result Value Ref Range   Specimen Description BLOOD LEFT ANTECUBITAL    Special Requests      BOTTLES DRAWN AEROBIC AND ANAEROBIC Blood Culture adequate volume   Culture      NO GROWTH 5 DAYS Performed at [facility], [place] [address] [place], [place] [address]    Report Status 04/13/2022 FINAL   Culture, blood (Routine X 2) w Reflex to ID Panel   Specimen: BLOOD LEFT FOREARM  Result Value Ref Range   Specimen Description BLOOD LEFT FOREARM    Special Requests      BOTTLES DRAWN AEROBIC AND ANAEROBIC Blood Culture adequate volume   Culture      NO GROWTH 5 DAYS Performed at [facility], [place] [address], [place], [place] [address]    Report Status 04/13/2022 FINAL   Urine Culture   Specimen: Urine, Clean Catch  Result Value Ref Range   Specimen Description URINE, CLEAN CATCH    Special Requests      NONE Performed at  [facility], [place] [address] [place], [place] [address]    Culture MULTIPLE SPECIES PRESENT, SUGGEST RECOLLECTION (A)    Report Status 04/09/2022 FINAL   MRSA Next Gen by PCR, Nasal   Specimen: Nasal Mucosa; Nasal Swab  Result Value Ref Range   MRSA by PCR Next Gen NOT DETECTED NOT DETECTED  Culture, blood (Routine X 2) w Reflex to ID Panel   Specimen: BLOOD LEFT HAND  Result Value Ref Range   Specimen Description BLOOD LEFT HAND    Special Requests      BOTTLES DRAWN AEROBIC ONLY Blood Culture results may not be optimal due to an inadequate volume of blood received in culture bottles   Culture      NO GROWTH 5 DAYS Performed at [place] [address], [place], [place] [address]    Report Status 04/19/2022 FINAL   Culture, blood (Routine X 2) w Reflex to ID Panel   Specimen: BLOOD LEFT HAND  Result Value Ref Range   Specimen Description BLOOD LEFT HAND    Special Requests      BOTTLES DRAWN AEROBIC AND ANAEROBIC Blood Culture adequate volume   Culture      NO GROWTH 5 DAYS Performed at [facility], [place] [address] [place],  [address]    Report Status 04/19/2022 FINAL   SARS Coronavirus 2 by RT PCR (hospital order, performed in [facility] hospital lab) *cepheid single result test*  Anterior Nasal Swab   Specimen: Anterior Nasal Swab  Result Value Ref Range   SARS Coronavirus 2 by RT PCR NEGATIVE NEGATIVE  CBC with Differential  Result Value Ref Range   WBC 18.2 (H) 4.0 - 10.5 K/uL   RBC 2.91 (L) 3.87 - 5.11 MIL/uL   Hemoglobin 8.3 (L) 12.0 - 15.0 g/dL   HCT 26.0 (L) 36.0 - 46.0 %   MCV 89.3 80.0 - 100.0 fL   MCH 28.5 26.0 - 34.0 pg   MCHC 31.9 30.0 - 36.0 g/dL   RDW 16.5 (H) 11.5 - 15.5 %   Platelets 155 150 - 400 K/uL   nRBC 0.0 0.0 - 0.2 %   Neutrophils Relative % 90 %   Neutro Abs 16.5 (H) 1.7 - 7.7 K/uL   Lymphocytes Relative 4 %   Lymphs Abs 0.7 0.7 - 4.0 K/uL   Monocytes Relative 5 %   Monocytes Absolute 0.8 0.1 - 1.0 K/uL    Eosinophils Relative 0 %   Eosinophils Absolute 0.0 0.0 - 0.5 K/uL   Basophils Relative 0 %   Basophils Absolute 0.0 0.0 - 0.1 K/uL   Immature Granulocytes 1 %   Abs Immature Granulocytes 0.16 (H) 0.00 - 0.07 K/uL  Comprehensive metabolic panel  Result Value Ref Range   Sodium 134 (L) 135 - 145 mmol/L   Potassium 3.5 3.5 - 5.1 mmol/L   Chloride 102 98 - 111 mmol/L   CO2 24 22 - 32 mmol/L   Glucose, Bld 105 (H) 70 - 99 mg/dL   BUN 27 (H) 8 - 23 mg/dL   Creatinine, Ser 1.63 (H) 0.44 - 1.00 mg/dL   Calcium 8.2 (L) 8.9 - 10.3 mg/dL   Total Protein 5.7 (L) 6.5 - 8.1 g/dL   Albumin 2.8 (L) 3.5 - 5.0 g/dL   AST 22 15 - 41 U/L   ALT 32 0 - 44 U/L   Alkaline Phosphatase 62 38 - 126 U/L   Total Bilirubin 0.5 0.3 - 1.2 mg/dL   GFR, Estimated 32 (L) >60 mL/min   Anion gap 8 5 - 15  Urinalysis, Routine w reflex microscopic Urine, Clean Catch  Result Value Ref Range   Color, Urine YELLOW YELLOW   APPearance CLEAR CLEAR   Specific Gravity, Urine 1.014 1.005 - 1.030   pH 6.0 5.0 - 8.0   Glucose, UA >=500 (A) NEGATIVE mg/dL   Hgb urine dipstick SMALL (A) NEGATIVE   Bilirubin Urine NEGATIVE NEGATIVE   Ketones, ur NEGATIVE NEGATIVE mg/dL   Protein, ur 30 (A) NEGATIVE mg/dL   Nitrite NEGATIVE NEGATIVE   Leukocytes,Ua NEGATIVE NEGATIVE   RBC / HPF 0-5 0 - 5 RBC/hpf   WBC, UA 0-5 0 - 5 WBC/hpf   Bacteria, UA NONE SEEN NONE SEEN   Squamous Epithelial / LPF 0-5 0 - 5  Magnesium  Result Value Ref Range   Magnesium 1.7 1.7 - 2.4 mg/dL  CK  Result Value Ref Range   Total CK 118 38 - 234 U/L  Glucose, capillary  Result Value Ref Range   Glucose-Capillary 109 (H) 70 - 99 mg/dL  Basic metabolic panel  Result Value Ref Range   Sodium 132 (L) 135 - 145 mmol/L   Potassium 3.7 3.5 - 5.1 mmol/L   Chloride 104 98 - 111 mmol/L   CO2 21 (L) 22 - 32 mmol/L   Glucose, Bld 135 (H) 70 - 99 mg/dL   BUN 24 (H) 8 - 23 mg/dL   Creatinine,  Ser 1.11 (H) 0.44 - 1.00 mg/dL   Calcium 8.4 (L) 8.9 - 10.3 mg/dL    GFR, Estimated 52 (L) >60 mL/min   Anion gap 7 5 - 15  CBC  Result Value Ref Range   WBC 13.5 (H) 4.0 - 10.5 K/uL   RBC 2.70 (L) 3.87 - 5.11 MIL/uL   Hemoglobin 7.7 (L) 12.0 - 15.0 g/dL   HCT 23.4 (L) 36.0 - 46.0 %   MCV 86.7 80.0 - 100.0 fL   MCH 28.5 26.0 - 34.0 pg   MCHC 32.9 30.0 - 36.0 g/dL   RDW 15.9 (H) 11.5 - 15.5 %   Platelets 143 (L) 150 - 400 K/uL   nRBC 0.0 0.0 - 0.2 %  Magnesium  Result Value Ref Range   Magnesium 1.9 1.7 - 2.4 mg/dL  Heparin level (unfractionated)  Result Value Ref Range   Heparin Unfractionated >1.10 (H) 0.30 - 0.70 IU/mL  APTT  Result Value Ref Range   aPTT 100 (H) 24 - 36 seconds  Glucose, capillary  Result Value Ref Range   Glucose-Capillary 127 (H) 70 - 99 mg/dL   Comment 1 Notify RN    Comment 2 Document in Chart   Glucose, capillary  Result Value Ref Range   Glucose-Capillary 160 (H) 70 - 99 mg/dL   Comment 1 Notify RN    Comment 2 Document in Chart   APTT  Result Value Ref Range   aPTT 116 (H) 24 - 36 seconds  Glucose, capillary  Result Value Ref Range   Glucose-Capillary 135 (H) 70 - 99 mg/dL   Comment 1 Notify RN    Comment 2 Document in Chart   Glucose, capillary  Result Value Ref Range   Glucose-Capillary 206 (H) 70 - 99 mg/dL  Glucose, capillary  Result Value Ref Range   Glucose-Capillary 165 (H) 70 - 99 mg/dL  CBC  Result Value Ref Range   WBC 18.6 (H) 4.0 - 10.5 K/uL   RBC 2.72 (L) 3.87 - 5.11 MIL/uL   Hemoglobin 7.9 (L) 12.0 - 15.0 g/dL   HCT 23.6 (L) 36.0 - 46.0 %   MCV 86.8 80.0 - 100.0 fL   MCH 29.0 26.0 - 34.0 pg   MCHC 33.5 30.0 - 36.0 g/dL   RDW 15.9 (H) 11.5 - 15.5 %   Platelets 159 150 - 400 K/uL   nRBC 0.0 0.0 - 0.2 %  APTT  Result Value Ref Range   aPTT 112 (H) 24 - 36 seconds  Glucose, capillary  Result Value Ref Range   Glucose-Capillary 156 (H) 70 - 99 mg/dL   Comment 1 Notify RN    Comment 2 Document in Chart   Heparin level (unfractionated)  Result Value Ref Range   Heparin  Unfractionated >1.10 (H) 0.30 - 0.70 IU/mL  APTT  Result Value Ref Range   aPTT 71 (H) 24 - 36 seconds  Glucose, capillary  Result Value Ref Range   Glucose-Capillary 141 (H) 70 - 99 mg/dL  Glucose, capillary  Result Value Ref Range   Glucose-Capillary 158 (H) 70 - 99 mg/dL  Heparin level (unfractionated)  Result Value Ref Range   Heparin Unfractionated >1.10 (H) 0.30 - 0.70 IU/mL  APTT  Result Value Ref Range   aPTT 87 (H) 24 - 36 seconds  Basic metabolic panel  Result Value Ref Range   Sodium 134 (L) 135 - 145 mmol/L   Potassium 4.0 3.5 - 5.1 mmol/L   Chloride 103 98 - 111  mmol/L   CO2 22 22 - 32 mmol/L   Glucose, Bld 129 (H) 70 - 99 mg/dL   BUN 40 (H) 8 - 23 mg/dL   Creatinine, Ser 1.43 (H) 0.44 - 1.00 mg/dL   Calcium 8.9 8.9 - 10.3 mg/dL   GFR, Estimated 38 (L) >60 mL/min   Anion gap 9 5 - 15  Glucose, capillary  Result Value Ref Range   Glucose-Capillary 140 (H) 70 - 99 mg/dL  Glucose, capillary  Result Value Ref Range   Glucose-Capillary 138 (H) 70 - 99 mg/dL  Glucose, capillary  Result Value Ref Range   Glucose-Capillary 120 (H) 70 - 99 mg/dL  Glucose, capillary  Result Value Ref Range   Glucose-Capillary 155 (H) 70 - 99 mg/dL  Glucose, capillary  Result Value Ref Range   Glucose-Capillary 156 (H) 70 - 99 mg/dL  Heparin level (unfractionated)  Result Value Ref Range   Heparin Unfractionated >1.10 (H) 0.30 - 0.70 IU/mL  APTT  Result Value Ref Range   aPTT 59 (H) 24 - 36 seconds  Glucose, capillary  Result Value Ref Range   Glucose-Capillary 161 (H) 70 - 99 mg/dL   Comment 1 Notify RN    Comment 2 Document in Chart   APTT  Result Value Ref Range   aPTT 76 (H) 24 - 36 seconds  Glucose, capillary  Result Value Ref Range   Glucose-Capillary 132 (H) 70 - 99 mg/dL   Comment 1 Notify RN    Comment 2 Document in Chart   Levetiracetam level  Result Value Ref Range   Levetiracetam Lvl 37.1 10.0 - 40.0 ug/mL  Glucose, capillary  Result Value Ref Range    Glucose-Capillary 153 (H) 70 - 99 mg/dL  Glucose, capillary  Result Value Ref Range   Glucose-Capillary 158 (H) 70 - 99 mg/dL  Heparin level (unfractionated)  Result Value Ref Range   Heparin Unfractionated 1.09 (H) 0.30 - 0.70 IU/mL  APTT  Result Value Ref Range   aPTT 76 (H) 24 - 36 seconds  Comprehensive metabolic panel  Result Value Ref Range   Sodium 133 (L) 135 - 145 mmol/L   Potassium 4.1 3.5 - 5.1 mmol/L   Chloride 105 98 - 111 mmol/L   CO2 22 22 - 32 mmol/L   Glucose, Bld 128 (H) 70 - 99 mg/dL   BUN 32 (H) 8 - 23 mg/dL   Creatinine, Ser 1.24 (H) 0.44 - 1.00 mg/dL   Calcium 8.3 (L) 8.9 - 10.3 mg/dL   Total Protein 5.2 (L) 6.5 - 8.1 g/dL   Albumin 2.6 (L) 3.5 - 5.0 g/dL   AST 17 15 - 41 U/L   ALT 36 0 - 44 U/L   Alkaline Phosphatase 43 38 - 126 U/L   Total Bilirubin 0.6 0.3 - 1.2 mg/dL   GFR, Estimated 45 (L) >60 mL/min   Anion gap 6 5 - 15  CBC with Differential/Platelet  Result Value Ref Range   WBC 12.5 (H) 4.0 - 10.5 K/uL   RBC 2.86 (L) 3.87 - 5.11 MIL/uL   Hemoglobin 8.1 (L) 12.0 - 15.0 g/dL   HCT 24.5 (L) 36.0 - 46.0 %   MCV 85.7 80.0 - 100.0 fL   MCH 28.3 26.0 - 34.0 pg   MCHC 33.1 30.0 - 36.0 g/dL   RDW 15.7 (H) 11.5 - 15.5 %   Platelets 143 (L) 150 - 400 K/uL   nRBC 0.0 0.0 - 0.2 %   Neutrophils Relative % 92 %  Neutro Abs 11.5 (H) 1.7 - 7.7 K/uL   Lymphocytes Relative 3 %   Lymphs Abs 0.4 (L) 0.7 - 4.0 K/uL   Monocytes Relative 4 %   Monocytes Absolute 0.5 0.1 - 1.0 K/uL   Eosinophils Relative 0 %   Eosinophils Absolute 0.0 0.0 - 0.5 K/uL   Basophils Relative 0 %   Basophils Absolute 0.0 0.0 - 0.1 K/uL   Immature Granulocytes 1 %   Abs Immature Granulocytes 0.13 (H) 0.00 - 0.07 K/uL  Glucose, capillary  Result Value Ref Range   Glucose-Capillary 133 (H) 70 - 99 mg/dL  Glucose, capillary  Result Value Ref Range   Glucose-Capillary 140 (H) 70 - 99 mg/dL   Comment 1 Notify RN    Comment 2 Document in Chart   Glucose, capillary  Result Value  Ref Range   Glucose-Capillary 186 (H) 70 - 99 mg/dL  Glucose, capillary  Result Value Ref Range   Glucose-Capillary 169 (H) 70 - 99 mg/dL  Heparin level (unfractionated)  Result Value Ref Range   Heparin Unfractionated 1.02 (H) 0.30 - 0.70 IU/mL  APTT  Result Value Ref Range   aPTT 63 (H) 24 - 36 seconds  Glucose, capillary  Result Value Ref Range   Glucose-Capillary 238 (H) 70 - 99 mg/dL  Glucose, capillary  Result Value Ref Range   Glucose-Capillary 173 (H) 70 - 99 mg/dL  Glucose, capillary  Result Value Ref Range   Glucose-Capillary 197 (H) 70 - 99 mg/dL  Heparin level (unfractionated)  Result Value Ref Range   Heparin Unfractionated >1.10 (H) 0.30 - 0.70 IU/mL  APTT  Result Value Ref Range   aPTT 88 (H) 24 - 36 seconds  CBC  Result Value Ref Range   WBC 12.7 (H) 4.0 - 10.5 K/uL   RBC 2.85 (L) 3.87 - 5.11 MIL/uL   Hemoglobin 8.1 (L) 12.0 - 15.0 g/dL   HCT 24.3 (L) 36.0 - 46.0 %   MCV 85.3 80.0 - 100.0 fL   MCH 28.4 26.0 - 34.0 pg   MCHC 33.3 30.0 - 36.0 g/dL   RDW 15.6 (H) 11.5 - 15.5 %   Platelets 135 (L) 150 - 400 K/uL   nRBC 0.0 0.0 - 0.2 %  Basic metabolic panel  Result Value Ref Range   Sodium 135 135 - 145 mmol/L   Potassium 4.2 3.5 - 5.1 mmol/L   Chloride 101 98 - 111 mmol/L   CO2 23 22 - 32 mmol/L   Glucose, Bld 153 (H) 70 - 99 mg/dL   BUN 32 (H) 8 - 23 mg/dL   Creatinine, Ser 1.05 (H) 0.44 - 1.00 mg/dL   Calcium 8.3 (L) 8.9 - 10.3 mg/dL   GFR, Estimated 55 (L) >60 mL/min   Anion gap 11 5 - 15  Glucose, capillary  Result Value Ref Range   Glucose-Capillary 149 (H) 70 - 99 mg/dL  Glucose, capillary  Result Value Ref Range   Glucose-Capillary 186 (H) 70 - 99 mg/dL   Comment 1 Notify RN    Comment 2 Document in Chart   Glucose, capillary  Result Value Ref Range   Glucose-Capillary 159 (H) 70 - 99 mg/dL   Comment 1 Notify RN    Comment 2 Document in Chart   Glucose, capillary  Result Value Ref Range   Glucose-Capillary 185 (H) 70 - 99 mg/dL   Glucose, capillary  Result Value Ref Range   Glucose-Capillary 143 (H) 70 - 99 mg/dL  Heparin level (unfractionated)  Result Value Ref Range   Heparin Unfractionated >1.10 (H) 0.30 - 0.70 IU/mL  APTT  Result Value Ref Range   aPTT 109 (H) 24 - 36 seconds  CBC  Result Value Ref Range   WBC 13.1 (H) 4.0 - 10.5 K/uL   RBC 2.95 (L) 3.87 - 5.11 MIL/uL   Hemoglobin 8.1 (L) 12.0 - 15.0 g/dL   HCT 25.3 (L) 36.0 - 46.0 %   MCV 85.8 80.0 - 100.0 fL   MCH 27.5 26.0 - 34.0 pg   MCHC 32.0 30.0 - 36.0 g/dL   RDW 15.2 11.5 - 15.5 %   Platelets 151 150 - 400 K/uL   nRBC 0.0 0.0 - 0.2 %  Basic metabolic panel  Result Value Ref Range   Sodium 133 (L) 135 - 145 mmol/L   Potassium 3.8 3.5 - 5.1 mmol/L   Chloride 101 98 - 111 mmol/L   CO2 23 22 - 32 mmol/L   Glucose, Bld 175 (H) 70 - 99 mg/dL   BUN 29 (H) 8 - 23 mg/dL   Creatinine, Ser 1.10 (H) 0.44 - 1.00 mg/dL   Calcium 8.0 (L) 8.9 - 10.3 mg/dL   GFR, Estimated 52 (L) >60 mL/min   Anion gap 9 5 - 15  Glucose, capillary  Result Value Ref Range   Glucose-Capillary 154 (H) 70 - 99 mg/dL   Comment 1 Notify RN    Comment 2 Document in Chart   Glucose, capillary  Result Value Ref Range   Glucose-Capillary 175 (H) 70 - 99 mg/dL   Comment 1 Notify RN    Comment 2 Document in Chart   Glucose, capillary  Result Value Ref Range   Glucose-Capillary 225 (H) 70 - 99 mg/dL  CBC  Result Value Ref Range   WBC 10.9 (H) 4.0 - 10.5 K/uL   RBC 3.04 (L) 3.87 - 5.11 MIL/uL   Hemoglobin 8.5 (L) 12.0 - 15.0 g/dL   HCT 26.5 (L) 36.0 - 46.0 %   MCV 87.2 80.0 - 100.0 fL   MCH 28.0 26.0 - 34.0 pg   MCHC 32.1 30.0 - 36.0 g/dL   RDW 15.4 11.5 - 15.5 %   Platelets 146 (L) 150 - 400 K/uL   nRBC 0.0 0.0 - 0.2 %  Magnesium  Result Value Ref Range   Magnesium 2.3 1.7 - 2.4 mg/dL  Brain natriuretic peptide  Result Value Ref Range   B Natriuretic Peptide 41.5 0.0 - 100.0 pg/mL  Basic metabolic panel  Result Value Ref Range   Sodium 134 (L) 135 - 145  mmol/L   Potassium 4.1 3.5 - 5.1 mmol/L   Chloride 98 98 - 111 mmol/L   CO2 22 22 - 32 mmol/L   Glucose, Bld 199 (H) 70 - 99 mg/dL   BUN 32 (H) 8 - 23 mg/dL   Creatinine, Ser 1.24 (H) 0.44 - 1.00 mg/dL   Calcium 8.3 (L) 8.9 - 10.3 mg/dL   GFR, Estimated 45 (L) >60 mL/min   Anion gap 14 5 - 15  Glucose, capillary  Result Value Ref Range   Glucose-Capillary 180 (H) 70 - 99 mg/dL  Glucose, capillary  Result Value Ref Range   Glucose-Capillary 190 (H) 70 - 99 mg/dL  Valproic acid level  Result Value Ref Range   Valproic Acid Lvl 66 50.0 - 100.0 ug/mL  Glucose, capillary  Result Value Ref Range   Glucose-Capillary 183 (H) 70 - 99 mg/dL  Glucose, capillary  Result Value Ref Range  Glucose-Capillary 162 (H) 70 - 99 mg/dL  Glucose, capillary  Result Value Ref Range   Glucose-Capillary 158 (H) 70 - 99 mg/dL  Glucose, capillary  Result Value Ref Range   Glucose-Capillary 160 (H) 70 - 99 mg/dL  CBC  Result Value Ref Range   WBC 14.5 (H) 4.0 - 10.5 K/uL   RBC 3.74 (L) 3.87 - 5.11 MIL/uL   Hemoglobin 10.5 (L) 12.0 - 15.0 g/dL   HCT 31.7 (L) 36.0 - 46.0 %   MCV 84.8 80.0 - 100.0 fL   MCH 28.1 26.0 - 34.0 pg   MCHC 33.1 30.0 - 36.0 g/dL   RDW 17.1 (H) 11.5 - 15.5 %   Platelets 101 (L) 150 - 400 K/uL   nRBC 0.0 0.0 - 0.2 %  Magnesium  Result Value Ref Range   Magnesium 2.1 1.7 - 2.4 mg/dL  Brain natriuretic peptide  Result Value Ref Range   B Natriuretic Peptide 55.8 0.0 - 100.0 pg/mL  Basic metabolic panel  Result Value Ref Range   Sodium 138 135 - 145 mmol/L   Potassium 4.6 3.5 - 5.1 mmol/L   Chloride 106 98 - 111 mmol/L   CO2 20 (L) 22 - 32 mmol/L   Glucose, Bld 186 (H) 70 - 99 mg/dL   BUN 27 (H) 8 - 23 mg/dL   Creatinine, Ser 1.05 (H) 0.44 - 1.00 mg/dL   Calcium 7.6 (L) 8.9 - 10.3 mg/dL   GFR, Estimated 55 (L) >60 mL/min   Anion gap 12 5 - 15  Glucose, capillary  Result Value Ref Range   Glucose-Capillary 179 (H) 70 - 99 mg/dL  Glucose, capillary  Result Value  Ref Range   Glucose-Capillary 166 (H) 70 - 99 mg/dL  Glucose, capillary  Result Value Ref Range   Glucose-Capillary 211 (H) 70 - 99 mg/dL  Magnesium  Result Value Ref Range   Magnesium 2.1 1.7 - 2.4 mg/dL  CBC  Result Value Ref Range   WBC 11.9 (H) 4.0 - 10.5 K/uL   RBC 3.29 (L) 3.87 - 5.11 MIL/uL   Hemoglobin 9.3 (L) 12.0 - 15.0 g/dL   HCT 28.3 (L) 36.0 - 46.0 %   MCV 86.0 80.0 - 100.0 fL   MCH 28.3 26.0 - 34.0 pg   MCHC 32.9 30.0 - 36.0 g/dL   RDW 17.3 (H) 11.5 - 15.5 %   Platelets 66 (L) 150 - 400 K/uL   nRBC 0.0 0.0 - 0.2 %  Brain natriuretic peptide  Result Value Ref Range   B Natriuretic Peptide 20.5 0.0 - 100.0 pg/mL  Basic metabolic panel  Result Value Ref Range   Sodium 139 135 - 145 mmol/L   Potassium 4.1 3.5 - 5.1 mmol/L   Chloride 108 98 - 111 mmol/L   CO2 21 (L) 22 - 32 mmol/L   Glucose, Bld 227 (H) 70 - 99 mg/dL   BUN 22 8 - 23 mg/dL   Creatinine, Ser 1.02 (H) 0.44 - 1.00 mg/dL   Calcium 7.7 (L) 8.9 - 10.3 mg/dL   GFR, Estimated 57 (L) >60 mL/min   Anion gap 10 5 - 15  Glucose, capillary  Result Value Ref Range   Glucose-Capillary 176 (H) 70 - 99 mg/dL  Glucose, capillary  Result Value Ref Range   Glucose-Capillary 174 (H) 70 - 99 mg/dL  Glucose, capillary  Result Value Ref Range   Glucose-Capillary 200 (H) 70 - 99 mg/dL  Glucose, capillary  Result Value Ref Range   Glucose-Capillary  221 (H) 70 - 99 mg/dL  Magnesium  Result Value Ref Range   Magnesium 2.3 1.7 - 2.4 mg/dL  CBC  Result Value Ref Range   WBC 12.0 (H) 4.0 - 10.5 K/uL   RBC 3.11 (L) 3.87 - 5.11 MIL/uL   Hemoglobin 8.8 (L) 12.0 - 15.0 g/dL   HCT 27.3 (L) 36.0 - 46.0 %   MCV 87.8 80.0 - 100.0 fL   MCH 28.3 26.0 - 34.0 pg   MCHC 32.2 30.0 - 36.0 g/dL   RDW 17.5 (H) 11.5 - 15.5 %   Platelets 66 (L) 150 - 400 K/uL   nRBC 0.0 0.0 - 0.2 %  Brain natriuretic peptide  Result Value Ref Range   B Natriuretic Peptide 42.8 0.0 - 100.0 pg/mL  Basic metabolic panel  Result Value Ref Range    Sodium 138 135 - 145 mmol/L   Potassium 4.0 3.5 - 5.1 mmol/L   Chloride 107 98 - 111 mmol/L   CO2 20 (L) 22 - 32 mmol/L   Glucose, Bld 184 (H) 70 - 99 mg/dL   BUN 23 8 - 23 mg/dL   Creatinine, Ser 1.01 (H) 0.44 - 1.00 mg/dL   Calcium 8.1 (L) 8.9 - 10.3 mg/dL   GFR, Estimated 58 (L) >60 mL/min   Anion gap 11 5 - 15  Glucose, capillary  Result Value Ref Range   Glucose-Capillary 200 (H) 70 - 99 mg/dL  Urinalysis, Routine w reflex microscopic Urine, Unspecified Source  Result Value Ref Range   Color, Urine YELLOW YELLOW   APPearance HAZY (A) CLEAR   Specific Gravity, Urine 1.022 1.005 - 1.030   pH 5.0 5.0 - 8.0   Glucose, UA >=500 (A) NEGATIVE mg/dL   Hgb urine dipstick MODERATE (A) NEGATIVE   Bilirubin Urine NEGATIVE NEGATIVE   Ketones, ur 5 (A) NEGATIVE mg/dL   Protein, ur 100 (A) NEGATIVE mg/dL   Nitrite NEGATIVE NEGATIVE   Leukocytes,Ua NEGATIVE NEGATIVE   RBC / HPF 11-20 0 - 5 RBC/hpf   WBC, UA 0-5 0 - 5 WBC/hpf   Bacteria, UA RARE (A) NONE SEEN   Squamous Epithelial / LPF 0-5 0 - 5   Budding Yeast PRESENT   Glucose, capillary  Result Value Ref Range   Glucose-Capillary 186 (H) 70 - 99 mg/dL  Glucose, capillary  Result Value Ref Range   Glucose-Capillary 148 (H) 70 - 99 mg/dL  Glucose, capillary  Result Value Ref Range   Glucose-Capillary 138 (H) 70 - 99 mg/dL  Magnesium  Result Value Ref Range   Magnesium 2.2 1.7 - 2.4 mg/dL  CBC  Result Value Ref Range   WBC 11.1 (H) 4.0 - 10.5 K/uL   RBC 2.78 (L) 3.87 - 5.11 MIL/uL   Hemoglobin 7.9 (L) 12.0 - 15.0 g/dL   HCT 24.4 (L) 36.0 - 46.0 %   MCV 87.8 80.0 - 100.0 fL   MCH 28.4 26.0 - 34.0 pg   MCHC 32.4 30.0 - 36.0 g/dL   RDW 17.4 (H) 11.5 - 15.5 %   Platelets 64 (L) 150 - 400 K/uL   nRBC 0.0 0.0 - 0.2 %  Basic metabolic panel  Result Value Ref Range   Sodium 142 135 - 145 mmol/L   Potassium 3.8 3.5 - 5.1 mmol/L   Chloride 110 98 - 111 mmol/L   CO2 21 (L) 22 - 32 mmol/L   Glucose, Bld 121 (H) 70 - 99 mg/dL    BUN 19 8 - 23 mg/dL  Creatinine, Ser 0.99 0.44 - 1.00 mg/dL   Calcium 7.9 (L) 8.9 - 10.3 mg/dL   GFR, Estimated 59 (L) >60 mL/min   Anion gap 11 5 - 15  Glucose, capillary  Result Value Ref Range   Glucose-Capillary 146 (H) 70 - 99 mg/dL  Glucose, capillary  Result Value Ref Range   Glucose-Capillary 133 (H) 70 - 99 mg/dL  Glucose, capillary  Result Value Ref Range   Glucose-Capillary 97 70 - 99 mg/dL  Magnesium  Result Value Ref Range   Magnesium 2.3 1.7 - 2.4 mg/dL  Phosphorus  Result Value Ref Range   Phosphorus 3.3 2.5 - 4.6 mg/dL  Phosphorus  Result Value Ref Range   Phosphorus 3.3 2.5 - 4.6 mg/dL  Glucose, capillary  Result Value Ref Range   Glucose-Capillary 105 (H) 70 - 99 mg/dL  Glucose, capillary  Result Value Ref Range   Glucose-Capillary 106 (H) 70 - 99 mg/dL  Glucose, capillary  Result Value Ref Range   Glucose-Capillary 102 (H) 70 - 99 mg/dL  Glucose, capillary  Result Value Ref Range   Glucose-Capillary 186 (H) 70 - 99 mg/dL  Magnesium  Result Value Ref Range   Magnesium 2.3 1.7 - 2.4 mg/dL  CBC  Result Value Ref Range   WBC 10.9 (H) 4.0 - 10.5 K/uL   RBC 2.88 (L) 3.87 - 5.11 MIL/uL   Hemoglobin 8.1 (L) 12.0 - 15.0 g/dL   HCT 25.7 (L) 36.0 - 46.0 %   MCV 89.2 80.0 - 100.0 fL   MCH 28.1 26.0 - 34.0 pg   MCHC 31.5 30.0 - 36.0 g/dL   RDW 17.4 (H) 11.5 - 15.5 %   Platelets 69 (L) 150 - 400 K/uL   nRBC 0.2 0.0 - 0.2 %  Basic metabolic panel  Result Value Ref Range   Sodium 142 135 - 145 mmol/L   Potassium 4.0 3.5 - 5.1 mmol/L   Chloride 110 98 - 111 mmol/L   CO2 22 22 - 32 mmol/L   Glucose, Bld 266 (H) 70 - 99 mg/dL   BUN 27 (H) 8 - 23 mg/dL   Creatinine, Ser 0.93 0.44 - 1.00 mg/dL   Calcium 8.1 (L) 8.9 - 10.3 mg/dL   GFR, Estimated >60 >60 mL/min   Anion gap 10 5 - 15  Phosphorus  Result Value Ref Range   Phosphorus 2.8 2.5 - 4.6 mg/dL  Glucose, capillary  Result Value Ref Range   Glucose-Capillary 218 (H) 70 - 99 mg/dL  Glucose,  capillary  Result Value Ref Range   Glucose-Capillary 202 (H) 70 - 99 mg/dL  Glucose, capillary  Result Value Ref Range   Glucose-Capillary 227 (H) 70 - 99 mg/dL  Glucose, capillary  Result Value Ref Range   Glucose-Capillary 228 (H) 70 - 99 mg/dL  Phosphorus  Result Value Ref Range   Phosphorus 2.9 2.5 - 4.6 mg/dL  Magnesium  Result Value Ref Range   Magnesium 2.4 1.7 - 2.4 mg/dL  Glucose, capillary  Result Value Ref Range   Glucose-Capillary 186 (H) 70 - 99 mg/dL  Glucose, capillary  Result Value Ref Range   Glucose-Capillary 187 (H) 70 - 99 mg/dL  Phosphorus  Result Value Ref Range   Phosphorus 2.9 2.5 - 4.6 mg/dL  Magnesium  Result Value Ref Range   Magnesium 2.4 1.7 - 2.4 mg/dL  CBC  Result Value Ref Range   WBC 11.0 (H) 4.0 - 10.5 K/uL   RBC 2.84 (L) 3.87 - 5.11 MIL/uL  Hemoglobin 7.8 (L) 12.0 - 15.0 g/dL   HCT 25.4 (L) 36.0 - 46.0 %   MCV 89.4 80.0 - 100.0 fL   MCH 27.5 26.0 - 34.0 pg   MCHC 30.7 30.0 - 36.0 g/dL   RDW 17.4 (H) 11.5 - 15.5 %   Platelets 74 (L) 150 - 400 K/uL   nRBC 0.2 0.0 - 0.2 %  Basic metabolic panel  Result Value Ref Range   Sodium 143 135 - 145 mmol/L   Potassium 3.8 3.5 - 5.1 mmol/L   Chloride 111 98 - 111 mmol/L   CO2 24 22 - 32 mmol/L   Glucose, Bld 169 (H) 70 - 99 mg/dL   BUN 33 (H) 8 - 23 mg/dL   Creatinine, Ser 0.90 0.44 - 1.00 mg/dL   Calcium 8.0 (L) 8.9 - 10.3 mg/dL   GFR, Estimated >60 >60 mL/min   Anion gap 8 5 - 15  Glucose, capillary  Result Value Ref Range   Glucose-Capillary 186 (H) 70 - 99 mg/dL  Glucose, capillary  Result Value Ref Range   Glucose-Capillary 181 (H) 70 - 99 mg/dL  Phosphorus  Result Value Ref Range   Phosphorus 3.5 2.5 - 4.6 mg/dL  Magnesium  Result Value Ref Range   Magnesium 2.4 1.7 - 2.4 mg/dL  Glucose, capillary  Result Value Ref Range   Glucose-Capillary 212 (H) 70 - 99 mg/dL  Glucose, capillary  Result Value Ref Range   Glucose-Capillary 133 (H) 70 - 99 mg/dL   Comment 1 Notify RN     Comment 2 Document in Chart   Glucose, capillary  Result Value Ref Range   Glucose-Capillary 113 (H) 70 - 99 mg/dL   Comment 1 Notify RN    Comment 2 Document in Chart   Glucose, capillary  Result Value Ref Range   Glucose-Capillary 120 (H) 70 - 99 mg/dL   Comment 1 Notify RN    Comment 2 Document in Chart   Phosphorus  Result Value Ref Range   Phosphorus 3.6 2.5 - 4.6 mg/dL  Magnesium  Result Value Ref Range   Magnesium 2.4 1.7 - 2.4 mg/dL  CBC  Result Value Ref Range   WBC 9.9 4.0 - 10.5 K/uL   RBC 2.84 (L) 3.87 - 5.11 MIL/uL   Hemoglobin 8.0 (L) 12.0 - 15.0 g/dL   HCT 25.7 (L) 36.0 - 46.0 %   MCV 90.5 80.0 - 100.0 fL   MCH 28.2 26.0 - 34.0 pg   MCHC 31.1 30.0 - 36.0 g/dL   RDW 17.5 (H) 11.5 - 15.5 %   Platelets 79 (L) 150 - 400 K/uL   nRBC 0.0 0.0 - 0.2 %  Basic metabolic panel  Result Value Ref Range   Sodium 143 135 - 145 mmol/L   Potassium 4.1 3.5 - 5.1 mmol/L   Chloride 110 98 - 111 mmol/L   CO2 23 22 - 32 mmol/L   Glucose, Bld 138 (H) 70 - 99 mg/dL   BUN 32 (H) 8 - 23 mg/dL   Creatinine, Ser 0.89 0.44 - 1.00 mg/dL   Calcium 7.8 (L) 8.9 - 10.3 mg/dL   GFR, Estimated >60 >60 mL/min   Anion gap 10 5 - 15  Glucose, capillary  Result Value Ref Range   Glucose-Capillary 131 (H) 70 - 99 mg/dL  Glucose, capillary  Result Value Ref Range   Glucose-Capillary 120 (H) 70 - 99 mg/dL  Glucose, capillary  Result Value Ref Range   Glucose-Capillary 113 (H) 70 -  99 mg/dL  Glucose, capillary  Result Value Ref Range   Glucose-Capillary 135 (H) 70 - 99 mg/dL  Glucose, capillary  Result Value Ref Range   Glucose-Capillary 149 (H) 70 - 99 mg/dL  Glucose, capillary  Result Value Ref Range   Glucose-Capillary 172 (H) 70 - 99 mg/dL  CBC  Result Value Ref Range   WBC 9.9 4.0 - 10.5 K/uL   RBC 2.87 (L) 3.87 - 5.11 MIL/uL   Hemoglobin 8.1 (L) 12.0 - 15.0 g/dL   HCT 25.5 (L) 36.0 - 46.0 %   MCV 88.9 80.0 - 100.0 fL   MCH 28.2 26.0 - 34.0 pg   MCHC 31.8 30.0 - 36.0  g/dL   RDW 17.2 (H) 11.5 - 15.5 %   Platelets 86 (L) 150 - 400 K/uL   nRBC 0.4 (H) 0.0 - 0.2 %  Basic metabolic panel  Result Value Ref Range   Sodium 144 135 - 145 mmol/L   Potassium 3.5 3.5 - 5.1 mmol/L   Chloride 106 98 - 111 mmol/L   CO2 26 22 - 32 mmol/L   Glucose, Bld 164 (H) 70 - 99 mg/dL   BUN 42 (H) 8 - 23 mg/dL   Creatinine, Ser 1.16 (H) 0.44 - 1.00 mg/dL   Calcium 8.0 (L) 8.9 - 10.3 mg/dL   GFR, Estimated 49 (L) >60 mL/min   Anion gap 12 5 - 15  Glucose, capillary  Result Value Ref Range   Glucose-Capillary 182 (H) 70 - 99 mg/dL  Glucose, capillary  Result Value Ref Range   Glucose-Capillary 180 (H) 70 - 99 mg/dL  Glucose, capillary  Result Value Ref Range   Glucose-Capillary 161 (H) 70 - 99 mg/dL  Glucose, capillary  Result Value Ref Range   Glucose-Capillary 145 (H) 70 - 99 mg/dL  Glucose, capillary  Result Value Ref Range   Glucose-Capillary 145 (H) 70 - 99 mg/dL  Glucose, capillary  Result Value Ref Range   Glucose-Capillary 185 (H) 70 - 99 mg/dL  CBC  Result Value Ref Range   WBC 10.0 4.0 - 10.5 K/uL   RBC 2.87 (L) 3.87 - 5.11 MIL/uL   Hemoglobin 8.0 (L) 12.0 - 15.0 g/dL   HCT 25.2 (L) 36.0 - 46.0 %   MCV 87.8 80.0 - 100.0 fL   MCH 27.9 26.0 - 34.0 pg   MCHC 31.7 30.0 - 36.0 g/dL   RDW 17.4 (H) 11.5 - 15.5 %   Platelets 81 (L) 150 - 400 K/uL   nRBC 0.2 0.0 - 0.2 %  Basic metabolic panel  Result Value Ref Range   Sodium 141 135 - 145 mmol/L   Potassium 3.8 3.5 - 5.1 mmol/L   Chloride 109 98 - 111 mmol/L   CO2 23 22 - 32 mmol/L   Glucose, Bld 136 (H) 70 - 99 mg/dL   BUN 41 (H) 8 - 23 mg/dL   Creatinine, Ser 0.97 0.44 - 1.00 mg/dL   Calcium 8.1 (L) 8.9 - 10.3 mg/dL   GFR, Estimated >60 >60 mL/min   Anion gap 9 5 - 15  Glucose, capillary  Result Value Ref Range   Glucose-Capillary 189 (H) 70 - 99 mg/dL  Glucose, capillary  Result Value Ref Range   Glucose-Capillary 172 (H) 70 - 99 mg/dL  Glucose, capillary  Result Value Ref Range    Glucose-Capillary 154 (H) 70 - 99 mg/dL  Glucose, capillary  Result Value Ref Range   Glucose-Capillary 114 (H) 70 - 99 mg/dL  Glucose,  capillary  Result Value Ref Range   Glucose-Capillary 151 (H) 70 - 99 mg/dL  Glucose, capillary  Result Value Ref Range   Glucose-Capillary 172 (H) 70 - 99 mg/dL  CBC  Result Value Ref Range   WBC 16.4 (H) 4.0 - 10.5 K/uL   RBC 3.21 (L) 3.87 - 5.11 MIL/uL   Hemoglobin 9.0 (L) 12.0 - 15.0 g/dL   HCT 28.8 (L) 36.0 - 46.0 %   MCV 89.7 80.0 - 100.0 fL   MCH 28.0 26.0 - 34.0 pg   MCHC 31.3 30.0 - 36.0 g/dL   RDW 17.3 (H) 11.5 - 15.5 %   Platelets 99 (L) 150 - 400 K/uL   nRBC 0.0 0.0 - 0.2 %  Basic metabolic panel  Result Value Ref Range   Sodium 141 135 - 145 mmol/L   Potassium 4.2 3.5 - 5.1 mmol/L   Chloride 103 98 - 111 mmol/L   CO2 25 22 - 32 mmol/L   Glucose, Bld 156 (H) 70 - 99 mg/dL   BUN 38 (H) 8 - 23 mg/dL   Creatinine, Ser 1.07 (H) 0.44 - 1.00 mg/dL   Calcium 8.8 (L) 8.9 - 10.3 mg/dL   GFR, Estimated 54 (L) >60 mL/min   Anion gap 13 5 - 15  Glucose, capillary  Result Value Ref Range   Glucose-Capillary 152 (H) 70 - 99 mg/dL  Glucose, capillary  Result Value Ref Range   Glucose-Capillary 112 (H) 70 - 99 mg/dL  Glucose, capillary  Result Value Ref Range   Glucose-Capillary 116 (H) 70 - 99 mg/dL  Glucose, capillary  Result Value Ref Range   Glucose-Capillary 133 (H) 70 - 99 mg/dL  Glucose, capillary  Result Value Ref Range   Glucose-Capillary 147 (H) 70 - 99 mg/dL  Glucose, capillary  Result Value Ref Range   Glucose-Capillary 223 (H) 70 - 99 mg/dL  Basic metabolic panel  Result Value Ref Range   Sodium 137 135 - 145 mmol/L   Potassium 4.3 3.5 - 5.1 mmol/L   Chloride 101 98 - 111 mmol/L   CO2 21 (L) 22 - 32 mmol/L   Glucose, Bld 110 (H) 70 - 99 mg/dL   BUN 38 (H) 8 - 23 mg/dL   Creatinine, Ser 0.95 0.44 - 1.00 mg/dL   Calcium 8.6 (L) 8.9 - 10.3 mg/dL   GFR, Estimated >60 >60 mL/min   Anion gap 15 5 - 15  Magnesium   Result Value Ref Range   Magnesium 2.3 1.7 - 2.4 mg/dL  Phosphorus  Result Value Ref Range   Phosphorus 3.6 2.5 - 4.6 mg/dL  Glucose, capillary  Result Value Ref Range   Glucose-Capillary 252 (H) 70 - 99 mg/dL  Glucose, capillary  Result Value Ref Range   Glucose-Capillary 133 (H) 70 - 99 mg/dL  Glucose, capillary  Result Value Ref Range   Glucose-Capillary 113 (H) 70 - 99 mg/dL  CBC  Result Value Ref Range   WBC 19.1 (H) 4.0 - 10.5 K/uL   RBC 2.97 (L) 3.87 - 5.11 MIL/uL   Hemoglobin 8.4 (L) 12.0 - 15.0 g/dL   HCT 25.8 (L) 36.0 - 46.0 %   MCV 86.9 80.0 - 100.0 fL   MCH 28.3 26.0 - 34.0 pg   MCHC 32.6 30.0 - 36.0 g/dL   RDW 17.2 (H) 11.5 - 15.5 %   Platelets 99 (L) 150 - 400 K/uL   nRBC 0.0 0.0 - 0.2 %  Glucose, capillary  Result Value Ref Range  Glucose-Capillary 120 (H) 70 - 99 mg/dL  Urinalysis, Routine w reflex microscopic  Result Value Ref Range   Color, Urine YELLOW YELLOW   APPearance HAZY (A) CLEAR   Specific Gravity, Urine 1.015 1.005 - 1.030   pH 7.0 5.0 - 8.0   Glucose, UA >=500 (A) NEGATIVE mg/dL   Hgb urine dipstick NEGATIVE NEGATIVE   Bilirubin Urine NEGATIVE NEGATIVE   Ketones, ur NEGATIVE NEGATIVE mg/dL   Protein, ur 30 (A) NEGATIVE mg/dL   Nitrite NEGATIVE NEGATIVE   Leukocytes,Ua NEGATIVE NEGATIVE   RBC / HPF 0-5 0 - 5 RBC/hpf   WBC, UA 0-5 0 - 5 WBC/hpf   Bacteria, UA RARE (A) NONE SEEN   Squamous Epithelial / LPF 0-5 0 - 5  Glucose, capillary  Result Value Ref Range   Glucose-Capillary 151 (H) 70 - 99 mg/dL  Glucose, capillary  Result Value Ref Range   Glucose-Capillary 160 (H) 70 - 99 mg/dL  CBC  Result Value Ref Range   WBC 16.3 (H) 4.0 - 10.5 K/uL   RBC 2.74 (L) 3.87 - 5.11 MIL/uL   Hemoglobin 7.9 (L) 12.0 - 15.0 g/dL   HCT 23.9 (L) 36.0 - 46.0 %   MCV 87.2 80.0 - 100.0 fL   MCH 28.8 26.0 - 34.0 pg   MCHC 33.1 30.0 - 36.0 g/dL   RDW 17.2 (H) 11.5 - 15.5 %   Platelets 87 (L) 150 - 400 K/uL   nRBC 0.0 0.0 - 0.2 %  Basic metabolic  panel  Result Value Ref Range   Sodium 138 135 - 145 mmol/L   Potassium 4.2 3.5 - 5.1 mmol/L   Chloride 106 98 - 111 mmol/L   CO2 24 22 - 32 mmol/L   Glucose, Bld 128 (H) 70 - 99 mg/dL   BUN 38 (H) 8 - 23 mg/dL   Creatinine, Ser 1.00 0.44 - 1.00 mg/dL   Calcium 8.5 (L) 8.9 - 10.3 mg/dL   GFR, Estimated 58 (L) >60 mL/min   Anion gap 8 5 - 15  Magnesium  Result Value Ref Range   Magnesium 2.4 1.7 - 2.4 mg/dL  Phosphorus  Result Value Ref Range   Phosphorus 4.0 2.5 - 4.6 mg/dL  Glucose, capillary  Result Value Ref Range   Glucose-Capillary 193 (H) 70 - 99 mg/dL  Glucose, capillary  Result Value Ref Range   Glucose-Capillary 158 (H) 70 - 99 mg/dL  Glucose, capillary  Result Value Ref Range   Glucose-Capillary 117 (H) 70 - 99 mg/dL  Glucose, capillary  Result Value Ref Range   Glucose-Capillary 131 (H) 70 - 99 mg/dL  Glucose, capillary  Result Value Ref Range   Glucose-Capillary 133 (H) 70 - 99 mg/dL  Glucose, capillary  Result Value Ref Range   Glucose-Capillary 132 (H) 70 - 99 mg/dL  Basic metabolic panel  Result Value Ref Range   Sodium 137 135 - 145 mmol/L   Potassium 4.5 3.5 - 5.1 mmol/L   Chloride 105 98 - 111 mmol/L   CO2 18 (L) 22 - 32 mmol/L   Glucose, Bld 102 (H) 70 - 99 mg/dL   BUN 38 (H) 8 - 23 mg/dL   Creatinine, Ser 0.91 0.44 - 1.00 mg/dL   Calcium 8.2 (L) 8.9 - 10.3 mg/dL   GFR, Estimated >60 >60 mL/min   Anion gap 14 5 - 15  CBC  Result Value Ref Range   WBC 11.4 (H) 4.0 - 10.5 K/uL   RBC 2.78 (L) 3.87 -  5.11 MIL/uL   Hemoglobin 7.9 (L) 12.0 - 15.0 g/dL   HCT 24.6 (L) 36.0 - 46.0 %   MCV 88.5 80.0 - 100.0 fL   MCH 28.4 26.0 - 34.0 pg   MCHC 32.1 30.0 - 36.0 g/dL   RDW 17.3 (H) 11.5 - 15.5 %   Platelets 93 (L) 150 - 400 K/uL   nRBC 0.0 0.0 - 0.2 %  Glucose, capillary  Result Value Ref Range   Glucose-Capillary 91 70 - 99 mg/dL  Glucose, capillary  Result Value Ref Range   Glucose-Capillary 126 (H) 70 - 99 mg/dL  Glucose, capillary  Result  Value Ref Range   Glucose-Capillary 107 (H) 70 - 99 mg/dL  Glucose, capillary  Result Value Ref Range   Glucose-Capillary 119 (H) 70 - 99 mg/dL  Glucose, capillary  Result Value Ref Range   Glucose-Capillary 156 (H) 70 - 99 mg/dL  Glucose, capillary  Result Value Ref Range   Glucose-Capillary 117 (H) 70 - 99 mg/dL  Glucose, capillary  Result Value Ref Range   Glucose-Capillary 132 (H) 70 - 99 mg/dL  Glucose, capillary  Result Value Ref Range   Glucose-Capillary 148 (H) 70 - 99 mg/dL  Glucose, capillary  Result Value Ref Range   Glucose-Capillary 118 (H) 70 - 99 mg/dL  Glucose, capillary  Result Value Ref Range   Glucose-Capillary 144 (H) 70 - 99 mg/dL  CBC with Differential/Platelet  Result Value Ref Range   WBC 8.0 4.0 - 10.5 K/uL   RBC 2.78 (L) 3.87 - 5.11 MIL/uL   Hemoglobin 7.9 (L) 12.0 - 15.0 g/dL   HCT 24.8 (L) 36.0 - 46.0 %   MCV 89.2 80.0 - 100.0 fL   MCH 28.4 26.0 - 34.0 pg   MCHC 31.9 30.0 - 36.0 g/dL   RDW 17.2 (H) 11.5 - 15.5 %   Platelets 92 (L) 150 - 400 K/uL   nRBC 0.0 0.0 - 0.2 %   Neutrophils Relative % 94 %   Neutro Abs 7.5 1.7 - 7.7 K/uL   Lymphocytes Relative 3 %   Lymphs Abs 0.2 (L) 0.7 - 4.0 K/uL   Monocytes Relative 2 %   Monocytes Absolute 0.1 0.1 - 1.0 K/uL   Eosinophils Relative 0 %   Eosinophils Absolute 0.0 0.0 - 0.5 K/uL   Basophils Relative 0 %   Basophils Absolute 0.0 0.0 - 0.1 K/uL   Immature Granulocytes 1 %   Abs Immature Granulocytes 0.07 0.00 - 0.07 K/uL  Comprehensive metabolic panel  Result Value Ref Range   Sodium 136 135 - 145 mmol/L   Potassium 3.8 3.5 - 5.1 mmol/L   Chloride 103 98 - 111 mmol/L   CO2 25 22 - 32 mmol/L   Glucose, Bld 126 (H) 70 - 99 mg/dL   BUN 32 (H) 8 - 23 mg/dL   Creatinine, Ser 0.88 0.44 - 1.00 mg/dL   Calcium 8.1 (L) 8.9 - 10.3 mg/dL   Total Protein 5.6 (L) 6.5 - 8.1 g/dL   Albumin 2.3 (L) 3.5 - 5.0 g/dL   AST 24 15 - 41 U/L   ALT 36 0 - 44 U/L   Alkaline Phosphatase 87 38 - 126 U/L   Total  Bilirubin 0.5 0.3 - 1.2 mg/dL   GFR, Estimated >60 >60 mL/min   Anion gap 8 5 - 15  Magnesium  Result Value Ref Range   Magnesium 2.1 1.7 - 2.4 mg/dL  Phosphorus  Result Value Ref Range   Phosphorus 3.4  2.5 - 4.6 mg/dL  Glucose, capillary  Result Value Ref Range   Glucose-Capillary 149 (H) 70 - 99 mg/dL  Glucose, capillary  Result Value Ref Range   Glucose-Capillary 123 (H) 70 - 99 mg/dL   Comment 1 Notify RN    Comment 2 Document in Chart   Glucose, capillary  Result Value Ref Range   Glucose-Capillary 142 (H) 70 - 99 mg/dL  Glucose, capillary  Result Value Ref Range   Glucose-Capillary 117 (H) 70 - 99 mg/dL  Glucose, capillary  Result Value Ref Range   Glucose-Capillary 118 (H) 70 - 99 mg/dL  Glucose, capillary  Result Value Ref Range   Glucose-Capillary 121 (H) 70 - 99 mg/dL  CBC with Differential/Platelet  Result Value Ref Range   WBC 7.2 4.0 - 10.5 K/uL   RBC 2.58 (L) 3.87 - 5.11 MIL/uL   Hemoglobin 7.4 (L) 12.0 - 15.0 g/dL   HCT 22.6 (L) 36.0 - 46.0 %   MCV 87.6 80.0 - 100.0 fL   MCH 28.7 26.0 - 34.0 pg   MCHC 32.7 30.0 - 36.0 g/dL   RDW 17.2 (H) 11.5 - 15.5 %   Platelets 85 (L) 150 - 400 K/uL   nRBC 0.0 0.0 - 0.2 %   Neutrophils Relative % 92 %   Neutro Abs 6.6 1.7 - 7.7 K/uL   Lymphocytes Relative 5 %   Lymphs Abs 0.3 (L) 0.7 - 4.0 K/uL   Monocytes Relative 2 %   Monocytes Absolute 0.2 0.1 - 1.0 K/uL   Eosinophils Relative 0 %   Eosinophils Absolute 0.0 0.0 - 0.5 K/uL   Basophils Relative 0 %   Basophils Absolute 0.0 0.0 - 0.1 K/uL   Immature Granulocytes 1 %   Abs Immature Granulocytes 0.06 0.00 - 0.07 K/uL  Comprehensive metabolic panel  Result Value Ref Range   Sodium 136 135 - 145 mmol/L   Potassium 3.5 3.5 - 5.1 mmol/L   Chloride 103 98 - 111 mmol/L   CO2 24 22 - 32 mmol/L   Glucose, Bld 147 (H) 70 - 99 mg/dL   BUN 26 (H) 8 - 23 mg/dL   Creatinine, Ser 0.83 0.44 - 1.00 mg/dL   Calcium 8.1 (L) 8.9 - 10.3 mg/dL   Total Protein 5.5 (L) 6.5 -  8.1 g/dL   Albumin 2.1 (L) 3.5 - 5.0 g/dL   AST 24 15 - 41 U/L   ALT 36 0 - 44 U/L   Alkaline Phosphatase 74 38 - 126 U/L   Total Bilirubin 0.6 0.3 - 1.2 mg/dL   GFR, Estimated >60 >60 mL/min   Anion gap 9 5 - 15  Magnesium  Result Value Ref Range   Magnesium 2.1 1.7 - 2.4 mg/dL  Phosphorus  Result Value Ref Range   Phosphorus 2.5 2.5 - 4.6 mg/dL  Urinalysis, Routine w reflex microscopic Urine, Clean Catch  Result Value Ref Range   Color, Urine YELLOW YELLOW   APPearance CLEAR CLEAR   Specific Gravity, Urine 1.017 1.005 - 1.030   pH 7.0 5.0 - 8.0   Glucose, UA 150 (A) NEGATIVE mg/dL   Hgb urine dipstick NEGATIVE NEGATIVE   Bilirubin Urine NEGATIVE NEGATIVE   Ketones, ur NEGATIVE NEGATIVE mg/dL   Protein, ur 100 (A) NEGATIVE mg/dL   Nitrite NEGATIVE NEGATIVE   Leukocytes,Ua NEGATIVE NEGATIVE   RBC / HPF 0-5 0 - 5 RBC/hpf   WBC, UA 0-5 0 - 5 WBC/hpf   Bacteria, UA NONE SEEN NONE SEEN  Squamous Epithelial / LPF 0-5 0 - 5  Glucose, capillary  Result Value Ref Range   Glucose-Capillary 148 (H) 70 - 99 mg/dL  Glucose, capillary  Result Value Ref Range   Glucose-Capillary 151 (H) 70 - 99 mg/dL  CBC with Differential/Platelet  Result Value Ref Range   WBC 6.2 4.0 - 10.5 K/uL   RBC 2.60 (L) 3.87 - 5.11 MIL/uL   Hemoglobin 7.5 (L) 12.0 - 15.0 g/dL   HCT 23.2 (L) 36.0 - 46.0 %   MCV 89.2 80.0 - 100.0 fL   MCH 28.8 26.0 - 34.0 pg   MCHC 32.3 30.0 - 36.0 g/dL   RDW 17.1 (H) 11.5 - 15.5 %   Platelets 85 (L) 150 - 400 K/uL   nRBC 0.0 0.0 - 0.2 %   Neutrophils Relative % 92 %   Neutro Abs 5.7 1.7 - 7.7 K/uL   Lymphocytes Relative 6 %   Lymphs Abs 0.4 (L) 0.7 - 4.0 K/uL   Monocytes Relative 2 %   Monocytes Absolute 0.1 0.1 - 1.0 K/uL   Eosinophils Relative 0 %   Eosinophils Absolute 0.0 0.0 - 0.5 K/uL   Basophils Relative 0 %   Basophils Absolute 0.0 0.0 - 0.1 K/uL   Immature Granulocytes 0 %   Abs Immature Granulocytes 0.02 0.00 - 0.07 K/uL  Comprehensive metabolic panel   Result Value Ref Range   Sodium 135 135 - 145 mmol/L   Potassium 4.0 3.5 - 5.1 mmol/L   Chloride 101 98 - 111 mmol/L   CO2 21 (L) 22 - 32 mmol/L   Glucose, Bld 115 (H) 70 - 99 mg/dL   BUN 26 (H) 8 - 23 mg/dL   Creatinine, Ser 0.75 0.44 - 1.00 mg/dL   Calcium 8.1 (L) 8.9 - 10.3 mg/dL   Total Protein 5.3 (L) 6.5 - 8.1 g/dL   Albumin 2.0 (L) 3.5 - 5.0 g/dL   AST 26 15 - 41 U/L   ALT 36 0 - 44 U/L   Alkaline Phosphatase 68 38 - 126 U/L   Total Bilirubin 0.5 0.3 - 1.2 mg/dL   GFR, Estimated >60 >60 mL/min   Anion gap 13 5 - 15  Magnesium  Result Value Ref Range   Magnesium 2.0 1.7 - 2.4 mg/dL  Phosphorus  Result Value Ref Range   Phosphorus 2.8 2.5 - 4.6 mg/dL  Glucose, capillary  Result Value Ref Range   Glucose-Capillary 124 (H) 70 - 99 mg/dL  Glucose, capillary  Result Value Ref Range   Glucose-Capillary 100 (H) 70 - 99 mg/dL  Glucose, capillary  Result Value Ref Range   Glucose-Capillary 114 (H) 70 - 99 mg/dL  Glucose, capillary  Result Value Ref Range   Glucose-Capillary 132 (H) 70 - 99 mg/dL  Glucose, capillary  Result Value Ref Range   Glucose-Capillary 149 (H) 70 - 99 mg/dL  Glucose, capillary  Result Value Ref Range   Glucose-Capillary 137 (H) 70 - 99 mg/dL  Glucose, capillary  Result Value Ref Range   Glucose-Capillary 120 (H) 70 - 99 mg/dL  Basic metabolic panel  Result Value Ref Range   Sodium 134 (L) 135 - 145 mmol/L   Potassium 3.8 3.5 - 5.1 mmol/L   Chloride 103 98 - 111 mmol/L   CO2 23 22 - 32 mmol/L   Glucose, Bld 142 (H) 70 - 99 mg/dL   BUN 29 (H) 8 - 23 mg/dL   Creatinine, Ser 0.78 0.44 - 1.00 mg/dL   Calcium 8.1 (  L) 8.9 - 10.3 mg/dL   GFR, Estimated >60 >60 mL/min   Anion gap 8 5 - 15  CBC with Differential/Platelet  Result Value Ref Range   WBC 5.2 4.0 - 10.5 K/uL   RBC 2.53 (L) 3.87 - 5.11 MIL/uL   Hemoglobin 7.2 (L) 12.0 - 15.0 g/dL   HCT 22.6 (L) 36.0 - 46.0 %   MCV 89.3 80.0 - 100.0 fL   MCH 28.5 26.0 - 34.0 pg   MCHC 31.9 30.0 -  36.0 g/dL   RDW 17.0 (H) 11.5 - 15.5 %   Platelets 96 (L) 150 - 400 K/uL   nRBC 0.0 0.0 - 0.2 %   Neutrophils Relative % 92 %   Neutro Abs 4.8 1.7 - 7.7 K/uL   Lymphocytes Relative 7 %   Lymphs Abs 0.4 (L) 0.7 - 4.0 K/uL   Monocytes Relative 0 %   Monocytes Absolute 0.0 (L) 0.1 - 1.0 K/uL   Eosinophils Relative 1 %   Eosinophils Absolute 0.1 0.0 - 0.5 K/uL   Basophils Relative 0 %   Basophils Absolute 0.0 0.0 - 0.1 K/uL   nRBC 1 (H) 0 /100 WBC   Abs Immature Granulocytes 0.00 0.00 - 0.07 K/uL   Acanthocytes PRESENT   Glucose, capillary  Result Value Ref Range   Glucose-Capillary 132 (H) 70 - 99 mg/dL  Glucose, capillary  Result Value Ref Range   Glucose-Capillary 119 (H) 70 - 99 mg/dL  Glucose, capillary  Result Value Ref Range   Glucose-Capillary 157 (H) 70 - 99 mg/dL  Glucose, capillary  Result Value Ref Range   Glucose-Capillary 147 (H) 70 - 99 mg/dL  Basic metabolic panel  Result Value Ref Range   Sodium 135 135 - 145 mmol/L   Potassium 3.9 3.5 - 5.1 mmol/L   Chloride 104 98 - 111 mmol/L   CO2 21 (L) 22 - 32 mmol/L   Glucose, Bld 161 (H) 70 - 99 mg/dL   BUN 35 (H) 8 - 23 mg/dL   Creatinine, Ser 0.82 0.44 - 1.00 mg/dL   Calcium 8.1 (L) 8.9 - 10.3 mg/dL   GFR, Estimated >60 >60 mL/min   Anion gap 10 5 - 15  CBC with Differential/Platelet  Result Value Ref Range   WBC 6.1 4.0 - 10.5 K/uL   RBC 2.52 (L) 3.87 - 5.11 MIL/uL   Hemoglobin 7.1 (L) 12.0 - 15.0 g/dL   HCT 21.6 (L) 36.0 - 46.0 %   MCV 85.7 80.0 - 100.0 fL   MCH 28.2 26.0 - 34.0 pg   MCHC 32.9 30.0 - 36.0 g/dL   RDW 17.1 (H) 11.5 - 15.5 %   Platelets 112 (L) 150 - 400 K/uL   nRBC 0.0 0.0 - 0.2 %   Neutrophils Relative % 90 %   Neutro Abs 5.5 1.7 - 7.7 K/uL   Lymphocytes Relative 6 %   Lymphs Abs 0.4 (L) 0.7 - 4.0 K/uL   Monocytes Relative 3 %   Monocytes Absolute 0.2 0.1 - 1.0 K/uL   Eosinophils Relative 0 %   Eosinophils Absolute 0.0 0.0 - 0.5 K/uL   Basophils Relative 0 %   Basophils Absolute  0.0 0.0 - 0.1 K/uL   WBC Morphology INCREASED BANDS (>20% BANDS)    RBC Morphology MORPHOLOGY UNREMARKABLE    Immature Granulocytes 1 %   Abs Immature Granulocytes 0.05 0.00 - 0.07 K/uL  Glucose, capillary  Result Value Ref Range   Glucose-Capillary 136 (H) 70 - 99 mg/dL  Glucose, capillary  Result Value Ref Range   Glucose-Capillary 167 (H) 70 - 99 mg/dL  Glucose, capillary  Result Value Ref Range   Glucose-Capillary 174 (H) 70 - 99 mg/dL  Glucose, capillary  Result Value Ref Range   Glucose-Capillary 152 (H) 70 - 99 mg/dL  Vitamin B12  Result Value Ref Range   Vitamin B-12 356 180 - 914 pg/mL  Folate  Result Value Ref Range   Folate 9.5 >5.9 ng/mL  Iron and TIBC  Result Value Ref Range   Iron 15 (L) 28 - 170 ug/dL   TIBC 193 (L) 250 - 450 ug/dL   Saturation Ratios 8 (L) 10.4 - 31.8 %   UIBC 178 ug/dL  Ferritin  Result Value Ref Range   Ferritin 440 (H) 11 - 307 ng/mL  Glucose, capillary  Result Value Ref Range   Glucose-Capillary 102 (H) 70 - 99 mg/dL  Glucose, capillary  Result Value Ref Range   Glucose-Capillary 98 70 - 99 mg/dL  Basic metabolic panel  Result Value Ref Range   Sodium 140 135 - 145 mmol/L   Potassium 3.0 (L) 3.5 - 5.1 mmol/L   Chloride 105 98 - 111 mmol/L   CO2 25 22 - 32 mmol/L   Glucose, Bld 132 (H) 70 - 99 mg/dL   BUN 40 (H) 8 - 23 mg/dL   Creatinine, Ser 0.95 0.44 - 1.00 mg/dL   Calcium 8.2 (L) 8.9 - 10.3 mg/dL   GFR, Estimated >60 >60 mL/min   Anion gap 10 5 - 15  CBC with Differential/Platelet  Result Value Ref Range   WBC 5.6 4.0 - 10.5 K/uL   RBC 2.31 (L) 3.87 - 5.11 MIL/uL   Hemoglobin 6.5 (LL) 12.0 - 15.0 g/dL   HCT 19.8 (L) 36.0 - 46.0 %   MCV 85.7 80.0 - 100.0 fL   MCH 28.1 26.0 - 34.0 pg   MCHC 32.8 30.0 - 36.0 g/dL   RDW 17.6 (H) 11.5 - 15.5 %   Platelets 127 (L) 150 - 400 K/uL   nRBC 0.0 0.0 - 0.2 %   Neutrophils Relative % 88 %   Neutro Abs 4.9 1.7 - 7.7 K/uL   Lymphocytes Relative 6 %   Lymphs Abs 0.3 (L) 0.7 - 4.0  K/uL   Monocytes Relative 4 %   Monocytes Absolute 0.2 0.1 - 1.0 K/uL   Eosinophils Relative 1 %   Eosinophils Absolute 0.0 0.0 - 0.5 K/uL   Basophils Relative 0 %   Basophils Absolute 0.0 0.0 - 0.1 K/uL   Immature Granulocytes 1 %   Abs Immature Granulocytes 0.06 0.00 - 0.07 K/uL  Glucose, capillary  Result Value Ref Range   Glucose-Capillary 150 (H) 70 - 99 mg/dL  Glucose, capillary  Result Value Ref Range   Glucose-Capillary 157 (H) 70 - 99 mg/dL  Glucose, capillary  Result Value Ref Range   Glucose-Capillary 144 (H) 70 - 99 mg/dL  Glucose, capillary  Result Value Ref Range   Glucose-Capillary 65 (L) 70 - 99 mg/dL  Magnesium  Result Value Ref Range   Magnesium 2.2 1.7 - 2.4 mg/dL  Glucose, capillary  Result Value Ref Range   Glucose-Capillary 110 (H) 70 - 99 mg/dL  Glucose, capillary  Result Value Ref Range   Glucose-Capillary 108 (H) 70 - 99 mg/dL  CBC with Differential/Platelet  Result Value Ref Range   WBC 6.4 4.0 - 10.5 K/uL   RBC 3.74 (L) 3.87 - 5.11 MIL/uL   Hemoglobin 10.5 (  L) 12.0 - 15.0 g/dL   HCT 30.8 (L) 36.0 - 46.0 %   MCV 82.4 80.0 - 100.0 fL   MCH 28.1 26.0 - 34.0 pg   MCHC 34.1 30.0 - 36.0 g/dL   RDW 17.2 (H) 11.5 - 15.5 %   Platelets 113 (L) 150 - 400 K/uL   nRBC 0.0 0.0 - 0.2 %   Neutrophils Relative % 85 %   Neutro Abs 5.4 1.7 - 7.7 K/uL   Lymphocytes Relative 8 %   Lymphs Abs 0.5 (L) 0.7 - 4.0 K/uL   Monocytes Relative 4 %   Monocytes Absolute 0.3 0.1 - 1.0 K/uL   Eosinophils Relative 1 %   Eosinophils Absolute 0.1 0.0 - 0.5 K/uL   Basophils Relative 0 %   Basophils Absolute 0.0 0.0 - 0.1 K/uL   Immature Granulocytes 2 %   Abs Immature Granulocytes 0.11 (H) 0.00 - 0.07 K/uL  Glucose, capillary  Result Value Ref Range   Glucose-Capillary 105 (H) 70 - 99 mg/dL  Basic metabolic panel  Result Value Ref Range   Sodium 144 135 - 145 mmol/L   Potassium 3.8 3.5 - 5.1 mmol/L   Chloride 110 98 - 111 mmol/L   CO2 24 22 - 32 mmol/L   Glucose,  Bld 108 (H) 70 - 99 mg/dL   BUN 31 (H) 8 - 23 mg/dL   Creatinine, Ser 0.90 0.44 - 1.00 mg/dL   Calcium 8.4 (L) 8.9 - 10.3 mg/dL   GFR, Estimated >60 >60 mL/min   Anion gap 10 5 - 15   *Note: Due to a large number of results and/or encounters for the requested time period, some results have not been displayed. A complete set of results can be found in Results Review.    DG Swallowing Func-Speech Pathology  Result Date: 05/28/2022 Table formatting from the original result was not included. 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  Anxiety   Breast cancer ([place]) 2008  right - s/p lumpectomy->chemo, radiation  Depression   Dysrhythmia   hx SVT  Family history of colon cancer   Family history of prostate cancer   GERD (gastroesophageal reflux disease)   H/O: hysterectomy   Hiatal hernia   History of cancer chemotherapy   History of radiation therapy   Hyperlipidemia   Hypertension 01/25/2018  Nonischemic cardiomyopathy ([place])   Personal history of radiation therapy 01/05/209  SVT (supraventricular tachycardia) ([place])   short RP SVT documented 7/61  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[place])   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)  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 Past Surgical History: Past Surgical History: Procedure Laterality Date  ABDOMINAL HYSTERECTOMY  1994  fibroids,   BIOPSY  06/19/2016  Procedure: BIOPSY;  Surgeon: Daneil Dolin, MD;  [facility];  Service: Endoscopy;;  gastric duodenum  BREAST BIOPSY Right 12/16/2006  malignant  BREAST EXCISIONAL BIOPSY  Right 2018  benign lumpectomy  BREAST LUMPECTOMY Right 02/2007  BREAST LUMPECTOMY WITH RADIOACTIVE SEED LOCALIZATION Right 07/29/2017  Procedure: RIGHT BREAST LUMPECTOMY WITH RADIOACTIVE SEED LOCALIZATION;  Surgeon: Dalbert Batman,  Renelda Loma, MD;  Location: [facility];  Service: General;  Laterality: Right;  BREAST SURGERY Right 2008  lumpectomy, cancer  CARDIAC CATHETERIZATION    CHOLECYSTECTOMY  1999  COLONOSCOPY  2008  Dr. Oneida Alar: multiple polyps. Path not available at time of visit.   COLONOSCOPY WITH PROPOFOL N/A 04/25/2014  Dr. Oneida Alar: Multiple tubular adenomas removed. Diverticulosis. Moderate internal hemorrhoids. Next colonoscopy planned for August 2018.  CRANIOTOMY Bilateral 03/24/2022  Procedure: Bifrontal craniotomy for resection of meningioma;  Surgeon: Ashok Pall, MD;  Location: [facility];  Service: Neurosurgery;  Laterality: Bilateral;  ESOPHAGOGASTRODUODENOSCOPY (EGD) WITH PROPOFOL N/A 06/19/2016  Procedure: ESOPHAGOGASTRODUODENOSCOPY (EGD) WITH PROPOFOL;  Surgeon: Daneil Dolin, MD;  [facility];  Service: Endoscopy;  Laterality: N/A;  MASTECTOMY W/ SENTINEL NODE BIOPSY Right 06/09/2018  Procedure: RIGHT TOTAL MASTECTOMY WITH SENTINEL LYMPH NODE BIOPSY;  Surgeon: Fanny Skates, MD;  Location: [facility];  Service: General;  Laterality: Right;  POLYPECTOMY N/A 04/25/2014  Procedure: POLYPECTOMY;  Surgeon: Danie Binder, MD;  [facility];  Service: Endoscopy;  Laterality: N/A;  Ascending and Decending Colon x3 , Transverse colon x2, rectal  PORTACATH PLACEMENT N/A 06/09/2018  Procedure: INSERTION PORT-A-CATH;  Surgeon: Fanny Skates, MD;  Location: [facility];  Service: General;  Laterality: N/A;  RIGHT/LEFT HEART CATH AND CORONARY ANGIOGRAPHY N/A 07/10/2017  Procedure: RIGHT/LEFT HEART CATH AND CORONARY ANGIOGRAPHY;  Surgeon: Larey Dresser, MD;  Location: [facility];  Service: Cardiovascular;  Laterality: N/A; HPI: Pt is a 76 y/o who presented 6/24 with new onset seizures. 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 EEG negative for seizures. Neurosurgey consulted and did not recommend meningioma resection as of 6/27. Decline in swallow function noted overnight 6/27. Dx acute toxic metabolic encephalopathy suspected secondary to keppra. Pt s/p Bifrontal craniotomy for resection of meningioma 7/3.  Cortrak 7/7. 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  CXR 8/6 concerning for patchy airspace disease. PMH: HTN, HLD, GERD, CKD stage IIIb, chronic HFpEF, meningioma, breast CA-s/p right lumpectomy and chemoradiation in 2008, depression/anxiety. BSE 03/11/22: regular texture diet and thin liquids without need for follow up. MBS 8/8 recommended Dys 1, honey thick and repeat MBS on 05/19/22 recommending upgrade to regular solids and nectar thick liquids.  Subjective: awake, alert, cooperative  Recommendations for follow up therapy are one component of a multi-disciplinary discharge planning process, led by the attending physician.  Recommendations may be updated based on patient status, additional functional criteria and insurance authorization. Assessment / Plan / Recommendation   05/28/2022   3:08 PM Clinical Impressions Clinical Impression Patient exhibits improved swallow function as compared to previous MBS on 05/19/22. Focus of today's MBS was to determine readiness for upgrade from nectar thick liquids. SLP tested patient with: thin barium, ultra thin barium, regular solids. She exhibited mild oral transit delays with solids but with full clearance and no delays in mastication. During pharyngeal phase, swallow initiation of solids was at level of vallecular sinus and min-mod vallecular residuals remained after initial swallow but cleared with subsequent swallows. When taking sips of thin liquids and ultra thin liquids, swallow was initiated at level of pyriform sinus and intermittent flash penetration observed. (full cleared laryngeal vestibule. She exhibited one instance of  trace penetration to the vocal cords that was not observed to clear and was not sensed by patient. Subsequent swallows of thin liquid barium, even large volume sips from straw, did not elicit penetration to vocal cords. SLP is recommending to upgrade liquids to thin  and continue with regular solids. Patient continues to require full supervision and assistance with PO's. SLP Visit Diagnosis Dysphagia, oropharyngeal phase (R13.12) Impact on safety and function Mild aspiration risk     05/28/2022   3:08 PM Treatment Recommendations Treatment Recommendations Therapy as outlined in treatment plan below     05/28/2022   3:13 PM Prognosis Prognosis for Safe Diet Advancement Good Barriers to Reach Goals Cognitive deficits   05/28/2022   3:08 PM Diet Recommendations SLP Diet Recommendations Regular solids;Thin liquid Liquid Administration via Cup;Straw Medication Administration Whole meds with puree Compensations Slow rate;Small sips/bites;Clear throat intermittently Postural Changes Seated upright at 90 degrees     05/28/2022   3:08 PM Other Recommendations Oral Care Recommendations Oral care BID Follow Up Recommendations Home health SLP Assistance recommended at discharge Frequent or constant Supervision/Assistance Functional Status Assessment Patient has had a recent decline in their functional status and demonstrates the ability to make significant improvements in function in a reasonable and predictable amount of time.   05/28/2022   3:08 PM Frequency and Duration  Speech Therapy Frequency (ACUTE ONLY) min 2x/week Treatment Duration 2 weeks     05/28/2022   2:55 PM Oral Phase Oral - Puree NT    05/28/2022   2:55 PM Pharyngeal Phase Pharyngeal- Puree NT    05/28/2022   2:52 PM Cervical Esophageal Phase  Cervical Esophageal Phase Impaired Sonia Baller, MA, CCC-SLP Speech Therapy                     DG CHEST PORT 1 VIEW  Result Date: 05/28/2022 CLINICAL DATA:  76 year old female with congestive heart failure. EXAM: PORTABLE CHEST -  1 VIEW COMPARISON:  05/14/2022, 04/13/2022 FINDINGS: The mediastinal contours are within normal limits. Similar appearing severe cardiomegaly. Again seen are multifocal calcified right hilar lymph nodes. The lungs are clear bilaterally without evidence of focal consolidation, pleural effusion, or pneumothorax. No acute osseous abnormality. Again seen are surgical clips in the right axilla and right upper quadrant. IMPRESSION: 1. Unchanged cardiomegaly. 2. No evidence of significant pulmonary edema or pleural effusions. 3. Similar appearing calcified right hilar lymph nodes compatible with prior granulomatous disease. Electronically Signed   By: Ruthann Cancer M.D.   On: 05/28/2022 08:16   DG Abd Portable 2V  Result Date: 05/21/2022 CLINICAL DATA:  Nausea vomiting x2 days. EXAM: PORTABLE ABDOMEN - 2 VIEW COMPARISON:  Abdominal radiograph dated 05/13/2022 and CT dated 05/06/2022. FINDINGS: Oral contrast noted throughout the colon and within the rectum. No bowel dilatation or evidence of obstruction. No free air. Right upper quadrant cholecystectomy clips. The osseous structures are intact. The soft tissues are unremarkable. IMPRESSION: No evidence of bowel obstruction. Electronically Signed   By: Anner Crete M.D.   On: 05/21/2022 17:28   DG Swallowing Func-Speech Pathology  Result Date: 05/19/2022 Table formatting from the original result was not included. 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  Anxiety   Breast cancer ([place]) 2008  right - s/p lumpectomy->chemo, radiation  Depression   Dysrhythmia   hx SVT   Family history of colon cancer   Family history of prostate cancer  GERD (gastroesophageal reflux disease)   H/O: hysterectomy   Hiatal hernia   History of cancer chemotherapy   History of radiation therapy   Hyperlipidemia   Hypertension 01/25/2018  Nonischemic cardiomyopathy ([place])   Personal history of radiation therapy 01/05/209  SVT (supraventricular tachycardia) ([place])   short RP SVT documented 3/90  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[place])   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)  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 Past Surgical History: Past Surgical History: Procedure Laterality Date  ABDOMINAL HYSTERECTOMY  1994  fibroids,   BIOPSY  06/19/2016  Procedure: BIOPSY;  Surgeon: Daneil Dolin, MD;  [facility];  Service: Endoscopy;;  gastric duodenum  BREAST BIOPSY Right 12/16/2006  malignant  BREAST EXCISIONAL BIOPSY Right 2018  benign lumpectomy  BREAST LUMPECTOMY Right 02/2007  BREAST LUMPECTOMY WITH RADIOACTIVE SEED LOCALIZATION Right 07/29/2017  Procedure: RIGHT BREAST LUMPECTOMY WITH RADIOACTIVE SEED LOCALIZATION;  Surgeon: Fanny Skates, MD;  Location: [facility];  Service: General;  Laterality: Right;  BREAST SURGERY Right 2008  lumpectomy, cancer  CARDIAC CATHETERIZATION    CHOLECYSTECTOMY  1999  COLONOSCOPY  2008  Dr. Oneida Alar: multiple polyps. Path not available at time of visit.   COLONOSCOPY WITH PROPOFOL N/A 04/25/2014  Dr. Oneida Alar: Multiple tubular adenomas removed. Diverticulosis. Moderate internal hemorrhoids. Next colonoscopy planned for August 2018.  CRANIOTOMY Bilateral 03/24/2022  Procedure: Bifrontal craniotomy for resection of meningioma;  Surgeon: Ashok Pall, MD;  Location: [facility];  Service: Neurosurgery;  Laterality: Bilateral;  ESOPHAGOGASTRODUODENOSCOPY (EGD) WITH PROPOFOL N/A 06/19/2016  Procedure: ESOPHAGOGASTRODUODENOSCOPY (EGD) WITH PROPOFOL;  Surgeon: Daneil Dolin, MD;  [facility];  Service: Endoscopy;  Laterality: N/A;   MASTECTOMY W/ SENTINEL NODE BIOPSY Right 06/09/2018  Procedure: RIGHT TOTAL MASTECTOMY WITH SENTINEL LYMPH NODE BIOPSY;  Surgeon: Fanny Skates, MD;  Location: [facility];  Service: General;  Laterality: Right;  POLYPECTOMY N/A 04/25/2014  Procedure: POLYPECTOMY;  Surgeon: Danie Binder, MD;  [facility];  Service: Endoscopy;  Laterality: N/A;  Ascending and Decending Colon x3 , Transverse colon x2, rectal  PORTACATH PLACEMENT N/A 06/09/2018  Procedure: INSERTION PORT-A-CATH;  Surgeon: Fanny Skates, MD;  Location: [facility];  Service: General;  Laterality: N/A;  RIGHT/LEFT HEART CATH AND CORONARY ANGIOGRAPHY N/A 07/10/2017  Procedure: RIGHT/LEFT HEART CATH AND CORONARY ANGIOGRAPHY;  Surgeon: Larey Dresser, MD;  Location: [facility];  Service: Cardiovascular;  Laterality: N/A; HPI: Pt is a 76 y/o who presented 6/24 with new onset seizures. 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 EEG negative for seizures. Neurosurgey consulted and did not recommend meningioma resection as of 6/27. Decline in swallow function noted overnight 6/27. Dx acute toxic metabolic encephalopathy suspected secondary to keppra. Pt s/p Bifrontal craniotomy for resection of meningioma 7/3.  Cortrak 7/7. 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  CXR 8/6 concerning for patchy airspace disease. PMH: HTN, HLD, GERD, CKD stage IIIb, chronic HFpEF, meningioma, breast CA-s/p right lumpectomy and chemoradiation in 2008, depression/anxiety. BSE 03/11/22: regular texture diet and thin liquids without need for follow up. MBS 8/8 recommended Dys 1, honey thick.  Subjective: daughter present; pt awake, alert, pleasant, participative  Recommendations for follow up therapy are one component of a multi-disciplinary discharge planning process, led by the attending physician.  Recommendations may be updated based on patient status, additional functional criteria and insurance authorization. Assessment / Plan /  Recommendation   05/19/2022   1:18  PM Clinical Impressions Clinical Impression Pt's swallow function has improved from prior MBS marked by significantly decreased volume of pharyngeal residue and absence of aspiration. Mastication and propulsion was mildly delayed with solid texture. Pt's swallow initiation was delayed 3-4 seconds to valleculae and once to pyriform sinuses. Multiple sips thin liquid via straw resulted in penetration to vocal cords (PAS 5) without immediate awareness but with delayed throat clear due to decreased timing of laryngeal closure. A second spontaneous swallow decreased amount of penetrate. There was reduced tongue base retraction with moderate vallecular residue and mild with other consistencies (valleculae and pyriform sinuses). Recommend upgrade to regular texture and to nectar thick liquids (from honey), straws allowed, crush meds and ask pt to clear her throat intermittently. ST will work with pt re: trials thin liquid with use of small sips via cup or straw. SLP Visit Diagnosis Dysphagia, oropharyngeal phase (R13.12) Impact on safety and function Mild aspiration risk     05/19/2022   1:18 PM Treatment Recommendations Treatment Recommendations Therapy as outlined in treatment plan below     05/19/2022   1:18 PM Prognosis Prognosis for Safe Diet Advancement Good Barriers to Reach Goals Cognitive deficits   05/19/2022   1:18 PM Diet Recommendations SLP Diet Recommendations Regular solids Medication Administration Whole meds with puree Compensations Slow rate;Small sips/bites;Clear throat intermittently Postural Changes Seated upright at 90 degrees     05/19/2022   1:18 PM Other Recommendations Oral Care Recommendations Oral care BID Follow Up Recommendations Skilled nursing-short term rehab (<3 hours/day) Assistance recommended at discharge Frequent or constant Supervision/Assistance Functional Status Assessment Patient has had a recent decline in their functional status and demonstrates the  ability to make significant improvements in function in a reasonable and predictable amount of time.   05/19/2022   1:18 PM Frequency and Duration  Speech Therapy Frequency (ACUTE ONLY) min 2x/week Treatment Duration 2 weeks     05/19/2022   1:18 PM Oral Phase Oral Phase Impaired Oral - Honey Cup NT Oral - Nectar Teaspoon WFL Oral - Nectar Cup WFL Oral - Nectar Straw WFL Oral - Thin Teaspoon WFL Oral - Thin Cup WFL Oral - Thin Straw WFL Oral - Puree WFL Oral - Mech Soft NT Oral - Regular Delayed oral transit    05/19/2022   1:18 PM Pharyngeal Phase Pharyngeal Phase Impaired Pharyngeal- Honey Cup NT Pharyngeal- Nectar Teaspoon Delayed swallow initiation-vallecula Pharyngeal- Nectar Cup Delayed swallow initiation-vallecula Pharyngeal Material does not enter airway Pharyngeal- Nectar Straw Delayed swallow initiation-pyriform sinuses Pharyngeal Material does not enter airway Pharyngeal- Thin Teaspoon Delayed swallow initiation-pyriform sinuses Pharyngeal- Thin Cup Penetration/Aspiration during swallow;Pharyngeal residue - pyriform;Pharyngeal residue - valleculae Pharyngeal Material enters airway, remains ABOVE vocal cords and not ejected out Pharyngeal- Thin Straw Pharyngeal residue - valleculae;Pharyngeal residue - pyriform;Penetration/Aspiration during swallow Pharyngeal Material enters airway, CONTACTS cords and not ejected out Pharyngeal- Puree NT Pharyngeal- Mechanical Soft NT Pharyngeal- Regular Pharyngeal residue - valleculae    05/19/2022   1:18 PM Cervical Esophageal Phase  Cervical Esophageal Phase -- [place] Siren 05/19/2022, 2:34 PM                     EEG adult  Result Date: 05/15/2022 Lora Havens, MD     05/15/2022  2:26 PM Patient Name: Tara [name] MRN: [id number] Epilepsy Attending: Lora Havens Referring Physician/Provider: Elodia Florence., MD Date: 05/15/2022 Duration: 22.12 mins Patient history:  76 y.o. female with past medical history of anxiety, depression, GERD,  hypertension, hyperlipidemia, nonischemic cardiomyopathy, atrial fibrillation on eliquis, Systolic HF, hx of breast cancer, meningioma who presented to the hospital on 03/15/2022 for evaluation of new onset seizures. Level of alertness: Awake AEDs during EEG study: LEV, LCM Technical aspects: This EEG study was done with scalp electrodes positioned according to the 10-20 International system of electrode placement. Electrical activity was reviewed with band pass filter of 1-'70Hz'$ , sensitivity of 7 uV/mm, display speed of 88m/sec with a '60Hz'$  notched filter applied as appropriate. EEG data were recorded continuously and digitally stored.  Video monitoring was available and reviewed as appropriate. Description: No clear posterior dominant rhythm was seen.  EEG showed continuous 3 to 5 Hz theta-delta slowing. Hyperventilation and photic stimulation were not performed.   ABNORMALITY - Continuous slow, generalized  IMPRESSION: This study is suggestive moderate diffuse encephalopathy, nonspecific etiology.  No seizures or epileptiform discharges were seen throughout the recording.  PLora Havens  DG CHEST PORT 1 VIEW  Result Date: 05/14/2022 CLINICAL DATA:  Leukocytosis. EXAM: PORTABLE CHEST 1 VIEW COMPARISON:  05/10/2022, CT 04/13/2022. FINDINGS: Low lung volumes. Stable heart size and mediastinal contours. Calcified right-sided adenopathy consistent with prior granulomatous disease. Improved left perihilar opacities from prior exam. No acute consolidation. No pleural effusion or pneumothorax. Stable osseous structures. Surgical clips in the right axilla. IMPRESSION: Low lung volumes. No acute abnormality. Improved left perihilar opacities from prior exam. Electronically Signed   By: MKeith RakeM.D.   On: 05/14/2022 18:06   DG Abd Portable 1V  Result Date: 05/13/2022 CLINICAL DATA:  Distended abdomen EXAM: PORTABLE ABDOMEN - 1 VIEW COMPARISON:  05/06/2022 FINDINGS: Normal bowel gas pattern. No dilated  bowel loops. Surgical clips right upper quadrant. No urinary tract calculi. IMPRESSION: Normal bowel gas pattern. Electronically Signed   By: CFranchot GalloM.D.   On: 05/13/2022 16:09   DG CHEST PORT 1 VIEW  Result Date: 05/10/2022 CLINICAL DATA:  Hypoxia EXAM: PORTABLE CHEST 1 VIEW COMPARISON:  04/29/2022 FINDINGS: Left perihilar interstitial thickening. No focal consolidation. No pleural effusion or pneumothorax. Stable cardiomegaly. Calcified right paratracheal and hilar lymph nodes. No acute osseous abnormality. IMPRESSION: 1. Left perihilar interstitial thickening likely reflecting mild interstitial edema versus pneumonia. Electronically Signed   By: HKathreen DevoidM.D.   On: 05/10/2022 10:27    Antibiotics:  Anti-infectives (From admission, onward)    Start     Dose/Rate Route Frequency Ordered Stop   04/21/22 0000  Ampicillin-Sulbactam (UNASYN) 3 g in sodium chloride 0.9 % 100 mL IVPB        3 g 200 mL/hr over 30 Minutes Intravenous Every 8 hours 04/20/22 1913 04/25/22 0018   04/12/22 1500  Ampicillin-Sulbactam (UNASYN) 3 g in sodium chloride 0.9 % 100 mL IVPB  Status:  Discontinued        3 g 200 mL/hr over 30 Minutes Intravenous Every 8 hours 04/12/22 1409 04/20/22 1913   04/11/22 1845  Ampicillin-Sulbactam (UNASYN) 3 g in sodium chloride 0.9 % 100 mL IVPB  Status:  Discontinued        3 g 200 mL/hr over 30 Minutes Intravenous Every 8 hours 04/11/22 1754 04/12/22 1409   04/10/22 1200  vancomycin (VANCOREADY) IVPB 750 mg/150 mL  Status:  Discontinued        750 mg 150 mL/hr over 60 Minutes Intravenous Every 24 hours 04/09/22 1034 04/11/22 1100   04/10/22 0900  metroNIDAZOLE (FLAGYL) IVPB 500 mg  Status:  Discontinued        500 mg 100  mL/hr over 60 Minutes Intravenous Every 12 hours 04/10/22 0754 04/11/22 1754   04/09/22 2300  ceFEPIme (MAXIPIME) 2 g in sodium chloride 0.9 % 100 mL IVPB  Status:  Discontinued        2 g 200 mL/hr over 30 Minutes Intravenous Every 12 hours 04/09/22  1035 04/11/22 1717   04/09/22 1045  vancomycin (VANCOREADY) IVPB 1750 mg/350 mL        1,750 mg 175 mL/hr over 120 Minutes Intravenous  Once 04/09/22 0953 04/09/22 1344   04/09/22 1045  ceFEPIme (MAXIPIME) 2 g in sodium chloride 0.9 % 100 mL IVPB        2 g 200 mL/hr over 30 Minutes Intravenous  Once 04/09/22 0953 04/09/22 1524   03/27/22 0945  Ampicillin-Sulbactam (UNASYN) 3 g in sodium chloride 0.9 % 100 mL IVPB        3 g 200 mL/hr over 30 Minutes Intravenous Every 6 hours 03/27/22 0859 04/01/22 0518   03/25/22 0600  ceFAZolin (ANCEF) IVPB 2g/100 mL premix        2 g 200 mL/hr over 30 Minutes Intravenous On call to O.R. 03/24/22 1215 03/24/22 1440       Discharge Exam: Blood pressure (!) 119/95, pulse 83, temperature 98.4 F (36.9 C), temperature source Oral, resp. rate 18, height '4\' 9"'$  (1.448 m), weight 74.7 kg, SpO2 91 %.  Awakens and talks, perseverates, MAEx4    Discharge Medications:   Allergies as of 06/07/2022       Reactions   Aspirin Other (See Comments)   Reports GI bleed--pt is currently taking   Lisinopril Cough   Sudafed [pseudoephedrine Hcl]    Pt reports she had drainage in throat that made her throat hurt.         Medication List     TAKE these medications    acetaminophen 500 MG tablet Commonly known as: TYLENOL Take 500-1,000 mg by mouth every 6 (six) hours as needed (FOR PAIN.).   amiodarone 200 MG tablet Commonly known as: PACERONE Take 1 tablet (200 mg total) by mouth daily.   azelastine 0.1 % nasal spray Commonly known as: ASTELIN Place 1 spray into both nostrils daily as needed for rhinitis. Use in each nostril as directed   clonazePAM 0.5 MG tablet Commonly known as: KLONOPIN Take 1 tablet (0.5 mg total) by mouth at bedtime.   dexamethasone 0.5 MG tablet Commonly known as: DECADRON Take 2 tabs for 3 days, then 1 tab for 3 days then stop.   docusate sodium 100 MG capsule Commonly known as: COLACE Take 200 mg by mouth 2 (two)  times daily as needed for mild constipation.   Eliquis 2.5 MG Tabs tablet Generic drug: apixaban Take 1 tablet (2.5 mg total) by mouth 2 (two) times daily.   ipratropium-albuterol 0.5-2.5 (3) MG/3ML Soln Commonly known as: DUONEB USe 1 vial by nebulization every 6 (six) hours as needed for shortness of breath.   Jardiance 10 MG Tabs tablet Generic drug: empagliflozin TAKE ONE TABLET BY MOUTH ONCE DAILY. What changed: how much to take   lacosamide 200 MG Tabs tablet Commonly known as: VIMPAT Take 1 tablet (200 mg total) by mouth 2 (two) times daily.   levETIRAcetam 750 MG tablet Commonly known as: KEPPRA Take 1 tablet (750 mg total) by mouth 2 (two) times daily.   montelukast 10 MG tablet Commonly known as: SINGULAIR TAKE ONE TABLET BY MOUTH AT BEDTIME.   pantoprazole 40 MG tablet Commonly known as: PROTONIX TAKE (1)  TABLET BY MOUTH TWICE DAILY. What changed: See the new instructions.   Preparation H 0.25-50 % Gel Generic drug: Phenylephrine-Witch Hazel Apply 1 Application topically daily as needed (hemorrhoid).   rosuvastatin 20 MG tablet Commonly known as: Crestor Take 1 tablet (20 mg total) by mouth daily.   sertraline 25 MG tablet Commonly known as: ZOLOFT Take 1 tablet (25 mg total) by mouth daily.   Vitamin D 50 MCG (2000 UT) Caps Take 2,000 Units by mouth daily.               Durable Medical Equipment  (From admission, onward)           Start     Ordered   06/05/22 1156  For home use only DME Other see comment  Once       Comments: Full bed rails  Question:  Length of Need  Answer:  Lifetime   06/05/22 1155   05/30/22 1335  For home use only DME Other see comment  Once       Comments: Power Lift Chair  Question:  Length of Need  Answer:  Lifetime   05/30/22 1335   05/30/22 1138  For home use only DME Nebulizer machine  Once       Question Answer Comment  Patient needs a nebulizer to treat with the following condition Bronchitis   Length  of Need 6 Months      05/30/22 1137   05/30/22 1138  For home use only DME oxygen  Once       Question Answer Comment  Length of Need 6 Months   Mode or (Route) Nasal cannula   Liters per Minute 2   Frequency Continuous (stationary and portable oxygen unit needed)   Oxygen conserving device Yes   Oxygen delivery system Gas      05/30/22 1137   05/29/22 0820  For home use only DME Other see comment  Once       Comments: Harrel Lemon Lift and pad & Stedy  Question:  Length of Need  Answer:  12 Months   05/29/22 0819   05/29/22 0817  For home use only DME standard manual wheelchair with seat cushion  Once       Comments: Patient suffers from brain tumors and seizures which impairs their ability to perform daily activities like bathing in the home.  A walker will not resolve issue with performing activities of daily living. A wheelchair will allow patient to safely perform daily activities. Patient can safely propel the wheelchair in the home or has a caregiver who can provide assistance. Length of need Lifetime. Accessories: elevating leg rests (ELRs), wheel locks, extensions, drop arms, and anti-tippers.   05/29/22 0819   05/29/22 0815  For home use only DME Hospital bed  Once       Question Answer Comment  Length of Need 12 Months   Patient has (list medical condition): impaired mobility   The above medical condition requires: Patient requires the ability to reposition frequently   Bed type Semi-electric   Hoyer Lift Yes   Support Surface: Gel Overlay      05/29/22 0819   05/29/22 0814  For home use only DME Walker youth  Once       Question:  Patient needs a walker to treat with the following condition  Answer:  Mobility impaired   05/29/22 0819   05/29/22 0814  For home use only DME 3 n 1  Once  Comments: With drop arms   05/29/22 0819            Disposition: home with [facility]   Final Dx: craniotomy for meningioma  Discharge Instructions     Call MD for:  difficulty  breathing, headache or visual disturbances   Complete by: As directed    Call MD for:  persistant nausea and vomiting   Complete by: As directed    Call MD for:  redness, tenderness, or signs of infection (pain, swelling, redness, odor or green/yellow discharge around incision site)   Complete by: As directed    Call MD for:  severe uncontrolled pain   Complete by: As directed    Call MD for:  temperature >100.4   Complete by: As directed    Diet - low sodium heart healthy   Complete by: As directed    Increase activity slowly   Complete by: As directed    No wound care   Complete by: As directed         Follow-up Information     Ashok Pall, MD. Schedule an appointment as soon as possible for a visit in 2 week(s).   Specialty: Neurosurgery Contact information: [address] [place] [address] [phone]                  Signed: Eustace Moore 06/07/2022, 7:49 AM

## 2022-06-07 NOTE — TOC Progression Note (Addendum)
Transition of Care [facility]) - Progression Note    Patient Details  Name: Tara [name] MRN: [id number] Date of Birth: Mar 26, 1946  Transition of Care [facility]) CM/SW Contact  Bartholomew Crews, RN Phone Number: [phone] 06/07/2022, 12:05 PM  Clinical Narrative:     Spoke with patient's daughter, [name], on her cell phone to discuss plans for transition home. DME has been delivered to home including correct bed rails. Confirmed PTAR transport is needed d/t steps - discussed transport destination [place].   [name] asked about referral to [facility] stating that patient could transition to [place] from home. Referral sent to [facility] [place] recommended updated PT/OT recommendations and stated that insurance authorization would be pursued. Patient could transition home with home health while awaiting authorization for inpatient rehab; however, if insurance requests Peer to Peer with MD, [facility] can reach out to attending via outpatient office. Patient will need HH orders for RN/PT/OT/Aide/SW with Face to Face - orders requested through secure chat.   Discussed outpatient referral for palliative care. Choice offered. Referral faxed to [place] [phone] for outpatient f/u for palliative care.   [name] updated on plans for updated PT/OT notes. [name] stated that the ultimate goal is for patient to transition back home with her husband in [place].   TOC following for transition needs.   Expected Discharge Plan: Skilled Nursing Facility Barriers to Discharge: Other (must enter comment) (see CM documentation- waiting neuro MD clearence)  Expected Discharge Plan and Services Expected Discharge Plan: [place]   Discharge Planning Services: CM Consult Post Acute Care Choice: Durable Medical Equipment, Home Health Living arrangements for the past 2 months: Single Family Home Expected  Discharge Date: 06/07/22               DME Arranged: 3-N-1, Hospital bed, Oxygen, Nebulizer/meds, Walker youth, Wheelchair manual, Other see comment (hoyer lift/stedy, lift chair)         HH Arranged: RN, PT, OT, Nurse's Aide HH Agency: [facility]         Social Determinants of Health (SDOH) Interventions    Readmission Risk Interventions    03/06/2022   12:36 PM  Readmission Risk Prevention Plan  Transportation Screening Complete  HRI or [place] Complete  Social Work Consult for [place] Planning/Counseling Complete

## 2022-06-07 NOTE — Progress Notes (Signed)
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 s/p tumor resection 7/3. Recent DC from [facility] 6/22. MRI on 7/26 shows: SDH extending inferiorly along the clivus and  narrowing the foramen magnum with possible mass effect. PMH: CKD stage 3, HFpEF, a fib, meningioma.   OT comments  Pt continues to be agreeable to therapy and motivated; benefiting some encouragement from family. Pt performing toilet hygiene with Mod A +2 demonstrating increased occupational performance. Pt completing mobility in hallway with Mod A +2 and eva walker. Pt demonstrating increased activity tolerance and feel she would benefit from intensive therapy to optimize safety in return to home. Will continue to follow acutely as admitted.    Recommendations for follow up therapy are one component of a multi-disciplinary discharge planning process, led by the attending physician.  Recommendations may be updated based on patient status, additional functional criteria and insurance authorization.    Follow Up Recommendations  Acute inpatient rehab (3hours/day)    Assistance Recommended at Discharge Frequent or constant Supervision/Assistance  Patient can return home with the following  Two people to help with walking and/or transfers;Two people to help with bathing/dressing/bathroom;Direct supervision/assist for medications management;Direct supervision/assist for financial management;Assist for transportation;Help with stairs or ramp for entrance;Assistance with feeding;Assistance with cooking/housework   Equipment Recommendations  BSC/3in1;Wheelchair (measurements OT);Wheelchair cushion (measurements OT);Hospital bed;Other (comment)    Recommendations for Other Services      Precautions / Restrictions Precautions Precautions: Fall Precaution  Comments: seizures Required Braces or Orthoses: Other Brace Other Brace: bilat resting hand splints,  ankle contracture boot, B Prafos/ankle contracture boot (wearing schedules posted in room) (encourage functioinal use of hands during the day and wear splints at night) Restrictions Weight Bearing Restrictions: No       Mobility Bed Mobility Overal bed mobility: Needs Assistance       Supine to sit: Min assist, HOB elevated Sit to supine: Mod assist   General bed mobility comments: Min A for elevating trunk. Mod A for managing    Transfers Overall transfer level: Needs assistance Equipment used: Bilateral platform walker Transfers: Sit to/from Stand Sit to Stand: Mod assist           General transfer comment: Mod A to power up and gain balance using [place] walker     Balance Overall balance assessment: Needs assistance Sitting-balance support: Feet supported, Single extremity supported, Bilateral upper extremity supported Sitting balance-Leahy Scale: Fair     Standing balance support: Bilateral upper extremity supported Standing balance-Leahy Scale: Poor Standing balance comment: assist for balance with UE support                           ADL either performed or assessed with clinical judgement   ADL Overall ADL's : Needs assistance/impaired                         Toilet Transfer: Minimal assistance;Moderate assistance;+2 for physical assistance (simulated)   Toileting- Clothing Manipulation and Hygiene: Moderate assistance;+2 for physical assistance;Sit to/from stand Toileting - Clothing Manipulation Details (indicate cue type and reason): Pt requiring assistance for performing posterior peri care with second person for balance in standing. Pt particiapting in anterior peri care with abel to perform with assistance for balance nad cues for sequencing     Functional  mobility during ADLs: Moderate assistance;+2 for physical assistance (eva  walker) General ADL Comments: Pt performing peri care while standing at EOB. Then completing mobility in hallway    Extremity/Trunk Assessment Upper Extremity Assessment Upper Extremity Assessment: RUE deficits/detail;LUE deficits/detail RUE Deficits / Details: Continues to have tendency for flexion. able to use RUE to hold wash cloth and clean peri area. Also able to grab eva walker LUE Deficits / Details: abel to reach forward to grab eva walker   Lower Extremity Assessment Lower Extremity Assessment: Defer to PT evaluation        Vision   Vision Assessment?: Vision impaired- to be further tested in functional context   Perception     Praxis      Cognition Arousal/Alertness: Awake/alert Behavior During Therapy: Flat affect, Anxious Overall Cognitive Status: Impaired/Different from baseline Area of Impairment: Attention, Orientation, Memory, Following commands, Safety/judgement, Awareness, Problem solving                 Orientation Level: Disoriented to, Time Current Attention Level: Sustained Memory: Decreased short-term memory Following Commands: Follows one step commands with increased time, Follows one step commands consistently Safety/Judgement: Decreased awareness of safety, Decreased awareness of deficits Awareness: Emergent Problem Solving: Slow processing, Decreased initiation, Difficulty sequencing, Requires verbal cues, Requires tactile cues General Comments: Verbalizing yes/no questions. Perseverating on word "sister". Requiring increased encouragement throughout        Exercises      Shoulder Instructions       General Comments husband present throughout    Pertinent Vitals/ Pain       Pain Assessment Pain Assessment: Faces Faces Pain Scale: Hurts a little bit Pain Location: generalized Pain Descriptors / Indicators: Grimacing Pain Intervention(s): Monitored during session, Limited activity within patient's tolerance, Repositioned  Home  Living                                          Prior Functioning/Environment              Frequency  Min 2X/week        Progress Toward Goals  OT Goals(current goals can now be found in the care plan section)  Progress towards OT goals: Progressing toward goals  Acute Rehab OT Goals OT Goal Formulation: With patient/family Time For Goal Achievement: 06/12/22 Potential to Achieve Goals: Fair ADL Goals Pt Will Perform Grooming: with mod assist;sitting Pt Will Perform Upper Body Bathing: with mod assist;sitting Pt Will Perform Lower Body Bathing: with max assist;sit to/from stand Pt Will Perform Lower Body Dressing: with modified independence;sitting/lateral leans;sit to/from stand Pt Will Transfer to Toilet: with min assist;with +2 assist;bedside commode;stand pivot transfer Pt Will Perform Toileting - Clothing Manipulation and hygiene: with modified independence;sitting/lateral leans;sit to/from stand Additional ADL Goal #1: Pt will track to midline to find family members with moderate directional cues and max assist to turn head Additional ADL Goal #2: Pt will follow simple commands 75% of time during ADL Additional ADL Goal #3: Ptwill maintain midlien posutral control EOB with min A in preparation for ADL tasks Additional ADL Goal #4: pt will tolerate B resting hand splints at nights to prevent further flexor tightness B hands  Plan Frequency remains appropriate;Discharge plan needs to be updated    Co-evaluation    PT/OT/SLP Co-Evaluation/Treatment: Yes Reason for Co-Treatment: For patient/therapist safety;To address functional/ADL transfers   OT goals addressed during session: ADL's  and self-care      AM-PAC OT "6 Clicks" Daily Activity     Outcome Measure   Help from another person eating meals?: A Lot Help from another person taking care of personal grooming?: A Lot Help from another person toileting, which includes using toliet, bedpan, or  urinal?: A Lot Help from another person bathing (including washing, rinsing, drying)?: A Lot Help from another person to put on and taking off regular upper body clothing?: A Lot Help from another person to put on and taking off regular lower body clothing?: Total 6 Click Score: 11    End of Session Equipment Utilized During Treatment: Gait belt  OT Visit Diagnosis: Unsteadiness on feet (R26.81);Other abnormalities of gait and mobility (R26.89);Muscle weakness (generalized) (M62.81);Low vision, both eyes (H54.2);Other symptoms and signs involving cognitive function;Pain   Activity Tolerance Patient tolerated treatment well   Patient Left in bed;with call bell/phone within reach;with family/visitor present;with bed alarm set   Nurse Communication Mobility status        Time: 3491-7915 OT Time Calculation (min): 30 min  Charges: OT General Charges $OT Visit: 1 Visit OT Treatments $Self Care/Home Management : 8-22 mins  [place] Student MSOT, OTR/L Acute Rehab Office: [place] 06/07/2022, 5:08 PM

## 2022-06-08 NOTE — TOC Progression Note (Addendum)
Transition of Care [facility]) - Progression Note    Patient Details  Name: Tara [name] MRN: [id number] Date of Birth: 06/26/1946  [facility] Care [facility]) CM/SW Contact  Bartholomew Crews, RN Phone Number: [phone] 06/08/2022, 10:45 AM  Clinical Narrative:     Discussed updated PT/OT recommendations with Tara [name], liaison at [facility]. [facility] to submit for insurance authorization in AM. If authorization received, patient to transition to [facility] upon bed availability. If authorization denied, patient to transition home to her daughter's - address in [facility] notes. PTAR needed for transition home. TOC following.   Update: Spoke with patient's daughter, Tara [name], on her cell phone to discus updated recommendations and [facility] to submit for authorization in AM. Reviewed Plan A for AIR and Plan B for [facility]. If authorization received for AIR, Tara [name] and family to provide transportation to [employer] barrier to home  transportation is the need for PTAR to home.   [facility] updated yesterday of potential plans to AIR pending.   Update: Spoke with patient and spouse at the bedside to provide update. Spouse stated someone from therapy stopped in to visit and discuss possible transition. Advised that this RNCM had reached out to CIR yesterday to discuss patient improvements and reassessment - CIR had advised of limited bed opportunities d/t demand and encouraged following up with other AIRs. However, CIR was to f/u. Spouse verbalized understanding.   Expected Discharge Plan: Skilled Nursing Facility Barriers to Discharge: Other (must enter comment) (see CM documentation- waiting neuro MD clearence)  Expected Discharge Plan and Services Expected Discharge Plan: [place]   Discharge Planning Services: CM Consult Post Acute Care Choice: Durable Medical Equipment, Home Health Living arrangements for the past 2 months: Single Family Home Expected Discharge  Date: 06/07/22               DME Arranged: 3-N-1, Hospital bed, Oxygen, Nebulizer/meds, Walker youth, Wheelchair manual, Other see comment (hoyer lift/stedy, lift chair)         HH Arranged: RN, PT, OT, Nurse's Aide HH Agency: [place]         Social Determinants of Health (SDOH) Interventions    Readmission Risk Interventions    03/06/2022   12:36 PM  Readmission Risk Prevention Plan  Transportation Screening Complete  HRI or [place] Complete  Social Work Consult for [place] Planning/Counseling Complete

## 2022-06-08 NOTE — Progress Notes (Signed)
Patient ID: Tara [name], female   DOB: [birth year], 76 y.o.   MRN: [id number] Awake and alert and eating breakfast.  She is pleasant and interactive today.  Moving her extremities well.  Incision well-healed.  Now awaiting CIR consultation

## 2022-06-09 NOTE — Progress Notes (Signed)
Speech Language Pathology Treatment: Cognitive-Linquistic  Patient Details Name: Tara [name] MRN: [id number] DOB: 12-Feb-1946 Today's Date: 06/09/2022 Time: 3151-7616 SLP Time Calculation (min) (ACUTE ONLY): 11 min  Assessment / Plan / Recommendation Clinical Impression  Pt initially asleep upon arrival but was easily awaken upon repositioning. Session focused on cognitive linguistic goals. Consistent with previous encounter, pt's speech initiation is improved as she engaged in natural conversation. Longer processing times are required as conversation becomes more complex. Pt's speech intelligibility continues to be reduced by phonemic errors, however, pt frequently self corrected most errors indicating awareness of the errors. Pt participated in a functional activity targeting attention, problem solving and reading comprehension. She accurately answered 4/6 reading comprehension questions provided moderate verbal cues. Pt demonstrated potential deficits in visual attention marked by issues identifying numbers located on the page and benefited from covering the excess words on the sheet. Pt continues to demonstrate problem-solving deficits as she was unable to perform mental math provided verbal instruction. ST will follow for cognitive tx.   HPI HPI: Pt is a 76 y/o who presented 6/24 with new onset seizures. 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 EEG negative for seizures. Neurosurgey consulted and did not recommend meningioma resection as of 6/27. Decline in swallow function noted overnight 6/27. Dx acute toxic metabolic encephalopathy suspected secondary to keppra. Pt s/p Bifrontal craniotomy for resection of meningioma 7/3.  Cortrak 7/7. 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  CXR 8/6 concerning for patchy airspace disease. PMH: HTN, HLD, GERD, CKD stage IIIb, chronic HFpEF, meningioma, breast CA-s/p right lumpectomy and chemoradiation in 2008,  depression/anxiety. BSE 03/11/22: regular texture diet and thin liquids without need for follow up. MBS 8/8 recommended Dys 1, honey thick and repeat MBS on 05/19/22 recommending upgrade to regular solids and nectar thick liquids.      SLP Plan  Continue with current plan of care      Recommendations for follow up therapy are one component of a multi-disciplinary discharge planning process, led by the attending physician.  Recommendations may be updated based on patient status, additional functional criteria and insurance authorization.    Recommendations                   Oral Care Recommendations: Oral care BID Follow Up Recommendations: Acute inpatient rehab (3hours/day) Assistance recommended at discharge: Frequent or constant Supervision/Assistance SLP Visit Diagnosis: Cognitive communication deficit (W73.710) Plan: Continue with current plan of care           [facility] Student SLP   06/09/2022, 1:52 PM

## 2022-06-09 NOTE — Care Management Important Message (Signed)
Important Message  Patient Details  Name: Tara [name] MRN: [id number] Date of Birth: [birth year]   Medicare Important Message Given:  Yes     Hannah Beat 06/09/2022, 10:57 AM

## 2022-06-09 NOTE — Progress Notes (Signed)
Inpatient Rehab Admissions Coordinator:   Per Therapy recommendations,  patient was screened for CIR candidacy by Clemens Catholic, MS, CCC-SLP. Discussed case with rehab MD. At this time, Pt. Appears to be a a potential candidate for CIR. I will place   order for rehab consult per protocol for full assessment.  Note that [facility] is pursuing auth on this Pt. And will need to withdraw their case if family prefers [facility]. I will reach out to [facility] to discuss. Please contact me any with questions.  Clemens Catholic, [place], [place] Admissions Coordinator  [phone] ([place]) [phone] (office)

## 2022-06-09 NOTE — Progress Notes (Signed)
Mobility Specialist Progress Note   06/09/22 1854  Mobility  Activity Refused mobility   Pt deferring mobility even w/ max encouragement but having no specific reason. Will f/u tomorrow if time permits.   Holland Falling Mobility Specialist MS [facility] #:  [phone] Acute Rehab Office:  [phone]

## 2022-06-09 NOTE — Progress Notes (Signed)
Patient ID: Tara [name], female   DOB: 05/23/1946, 76 y.o.   MRN: [id number] BP (!) 116/57 (BP Location: Left Leg)   Pulse 75   Temp 98.1 F (36.7 C) (Oral)   Resp 20   Ht '4\' 9"'$  (1.448 m)   Wt 74.7 kg   SpO2 94%   BMI 35.64 kg/m  Discharge in am Alert and oriented x 2 Moving all extremities Wound is clean, without signs of infection

## 2022-06-09 NOTE — Progress Notes (Signed)
Physical Therapy Treatment Patient Details Name: Tara [name] MRN: [id number] DOB: [birth year] Today's Date: 06/09/2022   History of Present Illness 76 yo woman admitted 03/15/22 with new onset seizures related to meningioma. s/p tumor resection 7/3. Recent DC from [facility] 6/22. MRI on 7/26 shows: SDH extending inferiorly along the clivus and  narrowing the foramen magnum with possible mass effect. PMH: CKD stage 3, HFpEF, a fib, meningioma.    PT Comments    The pt was agreeable to session and able to progress to use of RW for ambulation. She was able to maintain bilateral grip on RW, but demos increased need for assistance with balance, movement of RW, and decreased endurance with this device. The pt ultimately required seated rest after 12 ft, then 11f, then 5 ft ambulation and had 3 LOB as she fatigued. Will continue to benefit from skilled PT acutely and following d/c to maximize functional stability and safety with mobility.     Recommendations for follow up therapy are one component of a multi-disciplinary discharge planning process, led by the attending physician.  Recommendations may be updated based on patient status, additional functional criteria and insurance authorization.  Follow Up Recommendations  Acute inpatient rehab (3hours/day) Can patient physically be transported by private vehicle: Yes   Assistance Recommended at Discharge Frequent or constant Supervision/Assistance  Patient can return home with the following A lot of help with bathing/dressing/bathroom;A lot of help with walking and/or transfers;Assist for transportation;Assistance with cooking/housework;Help with stairs or ramp for entrance;Direct supervision/assist for financial management;Direct supervision/assist for medications management   Equipment Recommendations  Wheelchair (measurements PT);Wheelchair cushion (measurements PT);Hospital bed;BSC/3in1;Rolling walker (2 wheels)    Recommendations for Other Services  Rehab consult     Precautions / Restrictions Precautions Precautions: Fall Precaution Comments: seizures Required Braces or Orthoses: Other Brace Other Brace: bilat resting hand splints,  ankle contracture boot, B Prafos/ankle contracture boot (wearing schedules posted in room) (encourage functioinal use of hands during the day and wear splints at night) Restrictions Weight Bearing Restrictions: No     Mobility  Bed Mobility Overal bed mobility: Needs Assistance Bed Mobility: Sit to Supine     Supine to sit: Min assist, HOB elevated Sit to supine: Mod assist   General bed mobility comments: ModA to complete LE movement back into bed and max cues to reposition    Transfers Overall transfer level: Needs assistance Equipment used: Rolling walker (2 wheels) Transfers: Sit to/from Stand Sit to Stand: Mod assist, Min assist           General transfer comment: progressed from mShelbyto minA with max cues after reps with focus on hand placement. poor ability to sequence or repeat movements without repeated cues    Ambulation/Gait Ambulation/Gait assistance: Mod assist Gait Distance (Feet): 12 Feet (+ 7 ft + 5 ft) Assistive device: Rolling walker (2 wheels) Gait Pattern/deviations: Decreased stride length, Decreased step length - right, Decreased dorsiflexion - left, Decreased dorsiflexion - right, Trunk flexed, Drifts right/left, Shuffle, Scissoring, Narrow base of support, Knee hyperextension - left Gait velocity: reduced     General Gait Details: small steps with progression from minA to modA with pt not able to self-regulate fatigue. x3 LOB needing modA to correct as pt fatigues. cues to slow and improve posture. posterior and Left LOB   Modified Rankin (Stroke Patients Only) Modified Rankin (Stroke Patients Only) Pre-Morbid Rankin Score: Moderately severe disability Modified Rankin: Moderately severe disability     Balance Overall balance assessment: Needs  assistance  Sitting-balance support: Feet supported, Single extremity supported, Bilateral upper extremity supported Sitting balance-Leahy Scale: Fair     Standing balance support: Bilateral upper extremity supported Standing balance-Leahy Scale: Poor Standing balance comment: assist for balance with UE support                            Cognition Arousal/Alertness: Awake/alert Behavior During Therapy: Flat affect, Anxious Overall Cognitive Status: Impaired/Different from baseline Area of Impairment: Attention, Orientation, Memory, Following commands, Safety/judgement, Awareness, Problem solving                 Orientation Level: Disoriented to, Time Current Attention Level: Sustained Memory: Decreased short-term memory Following Commands: Follows one step commands with increased time, Follows one step commands inconsistently Safety/Judgement: Decreased awareness of safety, Decreased awareness of deficits Awareness: Emergent Problem Solving: Slow processing, Decreased initiation, Difficulty sequencing, Requires verbal cues, Requires tactile cues General Comments: needing multiple instructions to complete task as cued, even with repeated tasks. poor self-awareness for need for rest.        Exercises Other Exercises Other Exercises: repeated sit-stand transfers from recliner x 5    General Comments General comments (skin integrity, edema, etc.): VSS on RA, pt with increased WOB      Pertinent Vitals/Pain Pain Assessment Pain Assessment: No/denies pain Faces Pain Scale: No hurt Pain Location: generalized Pain Descriptors / Indicators: Grimacing Pain Intervention(s): Monitored during session     PT Goals (current goals can now be found in the care plan section) Acute Rehab PT Goals Patient Stated Goal: to get better PT Goal Formulation: With patient/family Time For Goal Achievement: 06/09/22 Potential to Achieve Goals: Fair Progress towards PT goals:  Progressing toward goals    Frequency    Min 4X/week      PT Plan Current plan remains appropriate    Co-evaluation PT/OT/SLP Co-Evaluation/Treatment: Yes            AM-PAC PT "6 Clicks" Mobility   Outcome Measure  Help needed turning from your back to your side while in a flat bed without using bedrails?: A Little Help needed moving from lying on your back to sitting on the side of a flat bed without using bedrails?: A Little Help needed moving to and from a bed to a chair (including a wheelchair)?: A Lot Help needed standing up from a chair using your arms (e.g., wheelchair or bedside chair)?: A Lot Help needed to walk in hospital room?: A Lot Help needed climbing 3-5 steps with a railing? : Total 6 Click Score: 13    End of Session Equipment Utilized During Treatment: Gait belt Activity Tolerance: Patient tolerated treatment well Patient left: in bed;with bed alarm set;with call bell/phone within reach;with family/visitor present Nurse Communication: Mobility status PT Visit Diagnosis: Other abnormalities of gait and mobility (R26.89);Other symptoms and signs involving the nervous system (R29.898);Unsteadiness on feet (R26.81);Muscle weakness (generalized) (M62.81);Difficulty in walking, not elsewhere classified (R26.2)     Time: 9675-9163 PT Time Calculation (min) (ACUTE ONLY): 24 min  Charges:  $Gait Training: 8-22 mins $Therapeutic Exercise: 8-22 mins                     West Carbo, PT, DPT   Acute Rehabilitation Department   Sandra Cockayne 06/09/2022, 11:23 AM

## 2022-06-09 NOTE — TOC Progression Note (Addendum)
Transition of Care [facility]) - Progression Note    Patient Details  Name: Tara [name] MRN: [id number] Date of Birth: [birth year]  Transition of Care [facility]) CM/SW [place], RN Phone Number:[phone]  06/09/2022, 9:35 AM  Clinical Narrative:    CM received message from Sherri Rad at [facility] Per Darcella [facility] inpatient rehab is in the process of submitting updated progress notes for insurance authorization. CM will await to hear from back from [place] to determine is [facility]auf[facility] has been accepted.   1010 CM has received message from [facility] stating that they may be able to take patient later this week but would need to start insurance auth for [facility]. This would mean that [facility] would need to withdraw insurance auth. CM spoke with daughter [name] who will speak with her father and determine which rehab they prefer. Will await return call.   1140 CM received message from [place] at [facility] stating that [facility] has received insurance authorization to admit patient today. CM has reached out to daughter [name] top determine if it is ok to proceed. There is no answer, message left will await return call.   65 CM spoke with Daughter [name] Per [name] her plan is to tour [facility] this evening with hopes of transfer tomorrow. CM has updated Anderson Malta at [place] and Mickel Baas with [facility].   Expected Discharge Plan: Skilled Nursing Facility Barriers to Discharge: Other (must enter comment) (see CM documentation- waiting neuro MD clearence)  Expected Discharge Plan and Services Expected Discharge Plan: [place]   Discharge Planning Services: CM Consult Post Acute Care Choice: Durable Medical Equipment, Home Health Living arrangements for the past 2 months: Single Family Home Expected Discharge Date: 06/07/22               DME Arranged: 3-N-1, Hospital bed, Oxygen, Nebulizer/meds, Walker youth, Wheelchair manual, Other see comment (hoyer lift/stedy, lift chair)         HH  Arranged: RN, PT, OT, Nurse's Aide HH Agency: [place]         Social Determinants of Health (SDOH) Interventions    Readmission Risk Interventions    03/06/2022   12:36 PM  Readmission Risk Prevention Plan  Transportation Screening Complete  HRI or [place] Complete  Social Work Consult for [place] Planning/Counseling Complete

## 2022-06-10 DIAGNOSIS — I255 Ischemic cardiomyopathy: Secondary | ICD-10-CM | POA: Diagnosis not present

## 2022-06-10 DIAGNOSIS — F329 Major depressive disorder, single episode, unspecified: Secondary | ICD-10-CM | POA: Diagnosis not present

## 2022-06-10 DIAGNOSIS — I4891 Unspecified atrial fibrillation: Secondary | ICD-10-CM | POA: Diagnosis not present

## 2022-06-10 DIAGNOSIS — Z7409 Other reduced mobility: Secondary | ICD-10-CM | POA: Diagnosis not present

## 2022-06-10 DIAGNOSIS — D496 Neoplasm of unspecified behavior of brain: Secondary | ICD-10-CM | POA: Diagnosis not present

## 2022-06-10 DIAGNOSIS — I480 Paroxysmal atrial fibrillation: Secondary | ICD-10-CM | POA: Diagnosis not present

## 2022-06-10 DIAGNOSIS — E785 Hyperlipidemia, unspecified: Secondary | ICD-10-CM | POA: Diagnosis not present

## 2022-06-10 DIAGNOSIS — K219 Gastro-esophageal reflux disease without esophagitis: Secondary | ICD-10-CM | POA: Diagnosis not present

## 2022-06-10 DIAGNOSIS — R531 Weakness: Secondary | ICD-10-CM | POA: Diagnosis not present

## 2022-06-10 DIAGNOSIS — F419 Anxiety disorder, unspecified: Secondary | ICD-10-CM | POA: Diagnosis not present

## 2022-06-10 DIAGNOSIS — R051 Acute cough: Secondary | ICD-10-CM | POA: Diagnosis not present

## 2022-06-10 DIAGNOSIS — Z483 Aftercare following surgery for neoplasm: Secondary | ICD-10-CM | POA: Diagnosis not present

## 2022-06-10 DIAGNOSIS — I10 Essential (primary) hypertension: Secondary | ICD-10-CM | POA: Diagnosis not present

## 2022-06-10 DIAGNOSIS — Z853 Personal history of malignant neoplasm of breast: Secondary | ICD-10-CM | POA: Diagnosis not present

## 2022-06-10 DIAGNOSIS — I517 Cardiomegaly: Secondary | ICD-10-CM | POA: Diagnosis not present

## 2022-06-10 DIAGNOSIS — I959 Hypotension, unspecified: Secondary | ICD-10-CM | POA: Diagnosis not present

## 2022-06-10 DIAGNOSIS — N179 Acute kidney failure, unspecified: Secondary | ICD-10-CM | POA: Diagnosis not present

## 2022-06-10 DIAGNOSIS — Z7901 Long term (current) use of anticoagulants: Secondary | ICD-10-CM | POA: Diagnosis not present

## 2022-06-10 DIAGNOSIS — R110 Nausea: Secondary | ICD-10-CM | POA: Diagnosis not present

## 2022-06-10 DIAGNOSIS — Y998 Other external cause status: Secondary | ICD-10-CM | POA: Diagnosis not present

## 2022-06-10 DIAGNOSIS — R569 Unspecified convulsions: Secondary | ICD-10-CM | POA: Diagnosis not present

## 2022-06-10 DIAGNOSIS — R918 Other nonspecific abnormal finding of lung field: Secondary | ICD-10-CM | POA: Diagnosis not present

## 2022-06-10 DIAGNOSIS — H15109 Unspecified episcleritis, unspecified eye: Secondary | ICD-10-CM | POA: Diagnosis not present

## 2022-06-10 DIAGNOSIS — E872 Acidosis, unspecified: Secondary | ICD-10-CM | POA: Diagnosis not present

## 2022-06-10 DIAGNOSIS — D329 Benign neoplasm of meninges, unspecified: Secondary | ICD-10-CM | POA: Diagnosis not present

## 2022-06-10 DIAGNOSIS — S065XAA Traumatic subdural hemorrhage with loss of consciousness status unknown, initial encounter: Secondary | ICD-10-CM | POA: Diagnosis not present

## 2022-06-10 DIAGNOSIS — K5909 Other constipation: Secondary | ICD-10-CM | POA: Diagnosis not present

## 2022-06-10 DIAGNOSIS — F418 Other specified anxiety disorders: Secondary | ICD-10-CM | POA: Diagnosis not present

## 2022-06-10 DIAGNOSIS — T17908A Unspecified foreign body in respiratory tract, part unspecified causing other injury, initial encounter: Secondary | ICD-10-CM | POA: Diagnosis not present

## 2022-06-10 DIAGNOSIS — X58XXXA Exposure to other specified factors, initial encounter: Secondary | ICD-10-CM | POA: Diagnosis not present

## 2022-06-10 DIAGNOSIS — J810 Acute pulmonary edema: Secondary | ICD-10-CM | POA: Diagnosis not present

## 2022-06-10 DIAGNOSIS — R059 Cough, unspecified: Secondary | ICD-10-CM | POA: Diagnosis not present

## 2022-06-10 DIAGNOSIS — R109 Unspecified abdominal pain: Secondary | ICD-10-CM | POA: Diagnosis not present

## 2022-06-10 DIAGNOSIS — R1110 Vomiting, unspecified: Secondary | ICD-10-CM | POA: Diagnosis not present

## 2022-06-10 NOTE — TOC Transition Note (Signed)
Transition of Care [facility]) - CM/SW Discharge Note   Patient Details  Name: NOEMIE DEVIVO MRN: [id number] Date of Birth: 10-29-1945  Transition of Care [facility]) CM/SW Contact:  Angelita Ingles, RN Phone Number:[phone]  06/10/2022, 11:48 AM   Clinical Narrative:    CM spoke with daughter [name] who confirms that she will transport patient to [place]. CM has verified with Anderson Malta at [facility] that she is able to receive patient. Nurse has been made aware to call report to [facility] inpatient rehab @ [phone]. No other needs noted at this time. Updated discharge summary  has been faxed to [phone]. TOC will sign off.    Final next level of care: IP Rehab Facility Barriers to Discharge: Other (must enter comment) (see CM documentation- waiting neuro MD clearence)   Patient Goals and CMS Choice   CMS Medicare.gov Compare Post Acute Care list provided to:: Patient Represenative (must comment) Choice offered to / list presented to : Patient, Adult Children  Discharge Placement                       Discharge Plan and Services   Discharge Planning Services: CM Consult Post Acute Care Choice: Durable Medical Equipment, Home Health          DME Arranged: 3-N-1, Hospital bed, Oxygen, Nebulizer/meds, Environmental consultant youth, Wheelchair manual, Other see comment (hoyer lift/stedy, lift chair)         HH Arranged: RN, PT, OT, Nurse's Aide HH Agency: [facility]        Social Determinants of Health (SDOH) Interventions     Readmission Risk Interventions    03/06/2022   12:36 PM  Readmission Risk Prevention Plan  Transportation Screening Complete  HRI or [place] Complete  Social Work Consult for [place] Planning/Counseling Complete

## 2022-06-10 NOTE — Discharge Summary (Signed)
Physician Discharge Summary  Patient ID: Tara [name] MRN: [id number] DOB/AGE: 1946-02-22 76 y.o.  Admit date: 03/15/2022 Discharge date: 06/10/2022  Admission [place] disorder meningioma  Discharge Diagnoses:  Principal Problem:   Seizure [facility]) Active Problems:   Hyperlipidemia   GERD   Leukocytosis   Hypokalemia   Acute on chronic combined systolic and diastolic CHF (congestive heart failure) (HCC)   AKI (acute kidney injury) ([place])   Hypertension   Anxiety and depression   Meningioma, cerebral (HCC)   Atrial fibrillation, chronic (HCC)   Protein-calorie malnutrition, moderate (HCC)   Meningioma (HCC)   Pressure injury of skin   Acute respiratory failure with hypoxia (HCC)   Aspiration into airway   Palliative care encounter   Dysfunction of right cardiac ventricle   Acute postoperative anemia due to expected blood loss   Dysphagia   Lethargy   New onset seizure [facility])   Discharged Condition: good  Hospital Course: Mrs. Tara [name] was initially admitted due to suspected seizures on 03/15/2022. She had recently been admitted for acute renal failure on 02/27/22 and discharged on 03/13/22. She also developed A-fib and was treated with amiodarone, and eliquis. Readmitted on 6/24 for possible seizure activity. She receive multiple EEG's and treated with anticonvulsants. Neurology concluded that the meningioma which had been diagnosed and followed for well over a decade was the cause of the seizures. I was consulted and performed a craniotomy on  03/24/22 for tumor resection. Transferred to the unit she received multiple EEG's after the resection, and multiple head ct's. She never had a tonic clonic seizure after the operation.  Neurologically she improved over two months and at discharge is following commands, oriented to person, place. Her wound is clean, and dry. She is ambulating with assistance. She will be admitted to [facility] for inpatient rehabilitation today.   Treatments:   Procedures EEG adult 05/15/2022 EEG adult 04/18/2022 EEG adult 04/01/2022 Procedure note 03/29/2022 Cortrak 03/28/2022 Overnight EEG with video 03/28/2022 Overnight EEG with video 03/24/2022 EEG adult 03/23/2022 Overnight EEG with video 03/16/2022 EEG adult 03/15/2022  Bifrontal craniotomy for resection of meningioma  Discharge Exam: Blood pressure (!) 104/47, pulse 69, temperature 98.4 F (36.9 C), temperature source Axillary, resp. rate 20, height '4\' 9"'$  (1.448 m), weight 74.7 kg, SpO2 93 %. General appearance: alert, appears stated age, and no distress  Disposition: Discharge disposition: [employer] Not Defined      Meningioma Discharge Instructions     Call MD for:  difficulty breathing, headache or visual disturbances   Complete by: As directed    Call MD for:  persistant nausea and vomiting   Complete by: As directed    Call MD for:  redness, tenderness, or signs of infection (pain, swelling, redness, odor or green/yellow discharge around incision site)   Complete by: As directed    Call MD for:  severe uncontrolled pain   Complete by: As directed    Call MD for:  temperature >100.4   Complete by: As directed    Diet - low sodium heart healthy   Complete by: As directed    Increase activity slowly   Complete by: As directed    No wound care   Complete by: As directed       Allergies as of 06/10/2022       Reactions   Aspirin Other (See Comments)   Reports GI bleed--pt is currently taking   Lisinopril Cough   Sudafed [pseudoephedrine Hcl]    Pt reports she  had drainage in throat that made her throat hurt.         Medication List     TAKE these medications    acetaminophen 500 MG tablet Commonly known as: TYLENOL Take 500-1,000 mg by mouth every 6 (six) hours as needed (FOR PAIN.).   amiodarone 200 MG tablet Commonly known as: PACERONE Take 1 tablet (200 mg total) by mouth daily.   apixaban 5 MG Tabs tablet Commonly known as:  ELIQUIS Take 1 tablet (5 mg total) by mouth 2 (two) times daily. What changed:  medication strength how much to take   azelastine 0.1 % nasal spray Commonly known as: ASTELIN Place 1 spray into both nostrils daily as needed for rhinitis. Use in each nostril as directed   clonazePAM 0.5 MG tablet Commonly known as: KLONOPIN Take 1 tablet (0.5 mg total) by mouth at bedtime.   dexamethasone 0.5 MG tablet Commonly known as: DECADRON Take 2 tabs for 3 days, then 1 tab for 3 days then stop.   docusate sodium 100 MG capsule Commonly known as: COLACE Take 200 mg by mouth 2 (two) times daily as needed for mild constipation.   ipratropium-albuterol 0.5-2.5 (3) MG/3ML Soln Commonly known as: DUONEB USe 1 vial by nebulization every 6 (six) hours as needed for shortness of breath.   Jardiance 10 MG Tabs tablet Generic drug: empagliflozin TAKE ONE TABLET BY MOUTH ONCE DAILY. What changed: how much to take   lacosamide 200 MG Tabs tablet Commonly known as: VIMPAT Take 1 tablet (200 mg total) by mouth 2 (two) times daily.   levETIRAcetam 750 MG tablet Commonly known as: KEPPRA Take 1 tablet (750 mg total) by mouth 2 (two) times daily.   montelukast 10 MG tablet Commonly known as: SINGULAIR TAKE ONE TABLET BY MOUTH AT BEDTIME.   pantoprazole 40 MG tablet Commonly known as: PROTONIX TAKE (1) TABLET BY MOUTH TWICE DAILY. What changed: See the new instructions.   Preparation H 0.25-50 % Gel Generic drug: Phenylephrine-Witch [place]el Apply 1 Application topically daily as needed (hemorrhoid).   rosuvastatin 20 MG tablet Commonly known as: Crestor Take 1 tablet (20 mg total) by mouth daily.   sertraline 25 MG tablet Commonly known as: ZOLOFT Take 1 tablet (25 mg total) by mouth daily.   Vitamin D 50 MCG (2000 UT) Caps Take 2,000 Units by mouth daily.               Durable Medical Equipment  (From admission, onward)           Start     Ordered   06/05/22 1156  For  home use only DME Other see comment  Once       Comments: Full bed rails  Question:  Length of Need  Answer:  Lifetime   06/05/22 1155   05/30/22 1335  For home use only DME Other see comment  Once       Comments: Power Lift Chair  Question:  Length of Need  Answer:  Lifetime   05/30/22 1335   05/30/22 1138  For home use only DME Nebulizer machine  Once       Question Answer Comment  Patient needs a nebulizer to treat with the following condition Bronchitis   Length of Need 6 Months      05/30/22 1137   05/30/22 1138  For home use only DME oxygen  Once       Question Answer Comment  Length of Need 6 Months  Mode or (Route) Nasal cannula   Liters per Minute 2   Frequency Continuous (stationary and portable oxygen unit needed)   Oxygen conserving device Yes   Oxygen delivery system Gas      05/30/22 1137   05/29/22 0820  For home use only DME Other see comment  Once       Comments: Harrel Lemon Lift and pad & Stedy  Question:  Length of Need  Answer:  12 Months   05/29/22 0819   05/29/22 0817  For home use only DME standard manual wheelchair with seat cushion  Once       Comments: Patient suffers from brain tumors and seizures which impairs their ability to perform daily activities like bathing in the home.  A walker will not resolve issue with performing activities of daily living. A wheelchair will allow patient to safely perform daily activities. Patient can safely propel the wheelchair in the home or has a caregiver who can provide assistance. Length of need Lifetime. Accessories: elevating leg rests (ELRs), wheel locks, extensions, drop arms, and anti-tippers.   05/29/22 0819   05/29/22 0815  For home use only DME Hospital bed  Once       Question Answer Comment  Length of Need 12 Months   Patient has (list medical condition): impaired mobility   The above medical condition requires: Patient requires the ability to reposition frequently   Bed type Semi-electric   Hoyer Lift Yes    Support Surface: Gel Overlay      05/29/22 0819   05/29/22 0814  For home use only DME Walker youth  Once       Question:  Patient needs a walker to treat with the following condition  Answer:  Mobility impaired   05/29/22 0819   05/29/22 0814  For home use only DME 3 n 1  Once       Comments: With drop arms   05/29/22 0819            Follow-up Information     Ashok Pall, MD. Schedule an appointment as soon as possible for a visit in 2 week(s).   Specialty: Neurosurgery Contact information: [address] [place] [address] [phone]                 Signed: Ashok Pall 06/10/2022, 10:20 AM

## 2022-06-10 NOTE — Progress Notes (Signed)
Pt discharged to [facility] IR. Report called to Herbert Spires, Therapist, sports. Pt's belongings all packed, PIV removed. 2 copies of discharge paperwork sent with family, with no questions at this time. Pt's husband and daughter driving her over to [facility] at this time.  Justice Rocher, RN

## 2022-06-10 NOTE — TOC Progression Note (Signed)
Transition of Care [facility]) - Progression Note    Patient Details  Name: Tara [name] MRN: [id number] Date of Birth: [birth year]  Transition of Care [facility]) CM/SW [place], RN Phone Number:[phone]  06/10/2022, 9:49 AM  Clinical Narrative:    CM received message from [place] at [place] stating that daughter has confirmed with Anderson Malta that she plans to transport patient to [name] today. CM called [name] (daughter )to confirm but no answer. Voicemail has been left.    Expected Discharge Plan: Skilled Nursing Facility Barriers to Discharge: Other (must enter comment) (see CM documentation- waiting neuro MD clearence)  Expected Discharge Plan and Services Expected Discharge Plan: [place]   Discharge Planning Services: CM Consult Post Acute Care Choice: Durable Medical Equipment, Home Health Living arrangements for the past 2 months: Single Family Home Expected Discharge Date: 06/07/22               DME Arranged: 3-N-1, Hospital bed, Oxygen, Nebulizer/meds, Walker youth, Wheelchair manual, Other see comment (hoyer lift/stedy, lift chair)         HH Arranged: RN, PT, OT, Nurse's Aide HH Agency: [place]         Social Determinants of Health (SDOH) Interventions    Readmission Risk Interventions    03/06/2022   12:36 PM  Readmission Risk Prevention Plan  Transportation Screening Complete  HRI or [place] Complete  Social Work Consult for [place] Planning/Counseling Complete

## 2022-06-11 DIAGNOSIS — D496 Neoplasm of unspecified behavior of brain: Secondary | ICD-10-CM | POA: Diagnosis not present

## 2022-06-22 DIAGNOSIS — G40909 Epilepsy, unspecified, not intractable, without status epilepticus: Secondary | ICD-10-CM | POA: Diagnosis not present

## 2022-06-22 DIAGNOSIS — Z4659 Encounter for fitting and adjustment of other gastrointestinal appliance and device: Secondary | ICD-10-CM | POA: Diagnosis not present

## 2022-06-22 DIAGNOSIS — D496 Neoplasm of unspecified behavior of brain: Secondary | ICD-10-CM | POA: Diagnosis not present

## 2022-06-22 DIAGNOSIS — Z9981 Dependence on supplemental oxygen: Secondary | ICD-10-CM | POA: Diagnosis not present

## 2022-06-22 DIAGNOSIS — Z66 Do not resuscitate: Secondary | ICD-10-CM | POA: Diagnosis not present

## 2022-06-22 DIAGNOSIS — R1319 Other dysphagia: Secondary | ICD-10-CM | POA: Diagnosis not present

## 2022-06-22 DIAGNOSIS — R059 Cough, unspecified: Secondary | ICD-10-CM | POA: Diagnosis not present

## 2022-06-22 DIAGNOSIS — Z5309 Procedure and treatment not carried out because of other contraindication: Secondary | ICD-10-CM | POA: Diagnosis not present

## 2022-06-22 DIAGNOSIS — T17908A Unspecified foreign body in respiratory tract, part unspecified causing other injury, initial encounter: Secondary | ICD-10-CM | POA: Diagnosis not present

## 2022-06-22 DIAGNOSIS — Z20822 Contact with and (suspected) exposure to covid-19: Secondary | ICD-10-CM | POA: Diagnosis not present

## 2022-06-22 DIAGNOSIS — J810 Acute pulmonary edema: Secondary | ICD-10-CM | POA: Diagnosis not present

## 2022-06-22 DIAGNOSIS — I5032 Chronic diastolic (congestive) heart failure: Secondary | ICD-10-CM | POA: Diagnosis not present

## 2022-06-22 DIAGNOSIS — I4891 Unspecified atrial fibrillation: Secondary | ICD-10-CM | POA: Diagnosis not present

## 2022-06-22 DIAGNOSIS — R1310 Dysphagia, unspecified: Secondary | ICD-10-CM | POA: Diagnosis not present

## 2022-06-22 DIAGNOSIS — F32A Depression, unspecified: Secondary | ICD-10-CM | POA: Diagnosis not present

## 2022-06-22 DIAGNOSIS — F39 Unspecified mood [affective] disorder: Secondary | ICD-10-CM | POA: Diagnosis not present

## 2022-06-22 DIAGNOSIS — R6339 Other feeding difficulties: Secondary | ICD-10-CM | POA: Diagnosis not present

## 2022-06-22 DIAGNOSIS — K5909 Other constipation: Secondary | ICD-10-CM | POA: Diagnosis not present

## 2022-06-22 DIAGNOSIS — I959 Hypotension, unspecified: Secondary | ICD-10-CM | POA: Diagnosis not present

## 2022-06-22 DIAGNOSIS — X58XXXA Exposure to other specified factors, initial encounter: Secondary | ICD-10-CM | POA: Diagnosis not present

## 2022-06-22 DIAGNOSIS — R918 Other nonspecific abnormal finding of lung field: Secondary | ICD-10-CM | POA: Diagnosis not present

## 2022-06-22 DIAGNOSIS — J690 Pneumonitis due to inhalation of food and vomit: Secondary | ICD-10-CM | POA: Diagnosis not present

## 2022-06-22 DIAGNOSIS — J45909 Unspecified asthma, uncomplicated: Secondary | ICD-10-CM | POA: Diagnosis not present

## 2022-06-22 DIAGNOSIS — N1832 Chronic kidney disease, stage 3b: Secondary | ICD-10-CM | POA: Diagnosis not present

## 2022-06-22 DIAGNOSIS — D329 Benign neoplasm of meninges, unspecified: Secondary | ICD-10-CM | POA: Diagnosis not present

## 2022-06-22 DIAGNOSIS — Y998 Other external cause status: Secondary | ICD-10-CM | POA: Diagnosis not present

## 2022-06-22 DIAGNOSIS — R1314 Dysphagia, pharyngoesophageal phase: Secondary | ICD-10-CM | POA: Diagnosis not present

## 2022-06-22 DIAGNOSIS — R509 Fever, unspecified: Secondary | ICD-10-CM | POA: Diagnosis not present

## 2022-06-22 DIAGNOSIS — K311 Adult hypertrophic pyloric stenosis: Secondary | ICD-10-CM | POA: Diagnosis not present

## 2022-06-22 DIAGNOSIS — E1122 Type 2 diabetes mellitus with diabetic chronic kidney disease: Secondary | ICD-10-CM | POA: Diagnosis not present

## 2022-06-22 DIAGNOSIS — K449 Diaphragmatic hernia without obstruction or gangrene: Secondary | ICD-10-CM | POA: Diagnosis not present

## 2022-06-22 DIAGNOSIS — I130 Hypertensive heart and chronic kidney disease with heart failure and stage 1 through stage 4 chronic kidney disease, or unspecified chronic kidney disease: Secondary | ICD-10-CM | POA: Diagnosis not present

## 2022-06-22 DIAGNOSIS — I255 Ischemic cardiomyopathy: Secondary | ICD-10-CM | POA: Diagnosis not present

## 2022-06-23 DIAGNOSIS — J690 Pneumonitis due to inhalation of food and vomit: Secondary | ICD-10-CM | POA: Diagnosis not present

## 2022-06-23 DIAGNOSIS — Z9981 Dependence on supplemental oxygen: Secondary | ICD-10-CM | POA: Diagnosis not present

## 2022-06-23 DIAGNOSIS — R1319 Other dysphagia: Secondary | ICD-10-CM | POA: Diagnosis not present

## 2022-06-23 DIAGNOSIS — T17908A Unspecified foreign body in respiratory tract, part unspecified causing other injury, initial encounter: Secondary | ICD-10-CM | POA: Diagnosis not present

## 2022-06-23 DIAGNOSIS — J810 Acute pulmonary edema: Secondary | ICD-10-CM | POA: Diagnosis not present

## 2022-06-24 DIAGNOSIS — R1310 Dysphagia, unspecified: Secondary | ICD-10-CM | POA: Diagnosis not present

## 2022-06-24 DIAGNOSIS — R1319 Other dysphagia: Secondary | ICD-10-CM | POA: Diagnosis not present

## 2022-06-24 DIAGNOSIS — Z9981 Dependence on supplemental oxygen: Secondary | ICD-10-CM | POA: Diagnosis not present

## 2022-06-24 DIAGNOSIS — J690 Pneumonitis due to inhalation of food and vomit: Secondary | ICD-10-CM | POA: Diagnosis not present

## 2022-06-25 DIAGNOSIS — R1310 Dysphagia, unspecified: Secondary | ICD-10-CM | POA: Diagnosis not present

## 2022-06-25 DIAGNOSIS — J45909 Unspecified asthma, uncomplicated: Secondary | ICD-10-CM | POA: Diagnosis not present

## 2022-06-25 DIAGNOSIS — R1319 Other dysphagia: Secondary | ICD-10-CM | POA: Diagnosis not present

## 2022-06-25 DIAGNOSIS — K449 Diaphragmatic hernia without obstruction or gangrene: Secondary | ICD-10-CM | POA: Diagnosis not present

## 2022-06-25 DIAGNOSIS — T17908A Unspecified foreign body in respiratory tract, part unspecified causing other injury, initial encounter: Secondary | ICD-10-CM | POA: Diagnosis not present

## 2022-06-25 DIAGNOSIS — R6339 Other feeding difficulties: Secondary | ICD-10-CM | POA: Diagnosis not present

## 2022-06-25 DIAGNOSIS — J690 Pneumonitis due to inhalation of food and vomit: Secondary | ICD-10-CM | POA: Diagnosis not present

## 2022-06-25 DIAGNOSIS — K311 Adult hypertrophic pyloric stenosis: Secondary | ICD-10-CM | POA: Diagnosis not present

## 2022-06-25 DIAGNOSIS — I4891 Unspecified atrial fibrillation: Secondary | ICD-10-CM | POA: Diagnosis not present

## 2022-06-26 DIAGNOSIS — T17908A Unspecified foreign body in respiratory tract, part unspecified causing other injury, initial encounter: Secondary | ICD-10-CM | POA: Diagnosis not present

## 2022-06-26 DIAGNOSIS — R1319 Other dysphagia: Secondary | ICD-10-CM | POA: Diagnosis not present

## 2022-06-26 DIAGNOSIS — J690 Pneumonitis due to inhalation of food and vomit: Secondary | ICD-10-CM | POA: Diagnosis not present

## 2022-06-26 DIAGNOSIS — R1314 Dysphagia, pharyngoesophageal phase: Secondary | ICD-10-CM | POA: Diagnosis not present

## 2022-06-27 DIAGNOSIS — R1314 Dysphagia, pharyngoesophageal phase: Secondary | ICD-10-CM | POA: Diagnosis not present

## 2022-06-27 DIAGNOSIS — J690 Pneumonitis due to inhalation of food and vomit: Secondary | ICD-10-CM | POA: Diagnosis not present

## 2022-06-28 DIAGNOSIS — F39 Unspecified mood [affective] disorder: Secondary | ICD-10-CM | POA: Diagnosis not present

## 2022-06-28 DIAGNOSIS — J45909 Unspecified asthma, uncomplicated: Secondary | ICD-10-CM | POA: Diagnosis not present

## 2022-06-28 DIAGNOSIS — R1310 Dysphagia, unspecified: Secondary | ICD-10-CM | POA: Diagnosis not present

## 2022-06-28 DIAGNOSIS — D329 Benign neoplasm of meninges, unspecified: Secondary | ICD-10-CM | POA: Diagnosis not present

## 2022-06-29 DIAGNOSIS — T17908A Unspecified foreign body in respiratory tract, part unspecified causing other injury, initial encounter: Secondary | ICD-10-CM | POA: Diagnosis not present

## 2022-06-29 DIAGNOSIS — R1310 Dysphagia, unspecified: Secondary | ICD-10-CM | POA: Diagnosis not present

## 2022-06-29 DIAGNOSIS — J45909 Unspecified asthma, uncomplicated: Secondary | ICD-10-CM | POA: Diagnosis not present

## 2022-06-29 DIAGNOSIS — F39 Unspecified mood [affective] disorder: Secondary | ICD-10-CM | POA: Diagnosis not present

## 2022-06-29 DIAGNOSIS — D329 Benign neoplasm of meninges, unspecified: Secondary | ICD-10-CM | POA: Diagnosis not present

## 2022-06-30 DIAGNOSIS — R1314 Dysphagia, pharyngoesophageal phase: Secondary | ICD-10-CM | POA: Diagnosis not present

## 2022-06-30 DIAGNOSIS — J690 Pneumonitis due to inhalation of food and vomit: Secondary | ICD-10-CM | POA: Diagnosis not present

## 2022-06-30 DIAGNOSIS — R509 Fever, unspecified: Secondary | ICD-10-CM | POA: Diagnosis not present

## 2022-07-01 DIAGNOSIS — J690 Pneumonitis due to inhalation of food and vomit: Secondary | ICD-10-CM | POA: Diagnosis not present

## 2022-07-01 DIAGNOSIS — R1319 Other dysphagia: Secondary | ICD-10-CM | POA: Diagnosis not present

## 2022-07-01 DIAGNOSIS — R509 Fever, unspecified: Secondary | ICD-10-CM | POA: Diagnosis not present

## 2022-07-01 DIAGNOSIS — R1314 Dysphagia, pharyngoesophageal phase: Secondary | ICD-10-CM | POA: Diagnosis not present

## 2022-07-01 DIAGNOSIS — J45909 Unspecified asthma, uncomplicated: Secondary | ICD-10-CM | POA: Diagnosis not present

## 2022-07-01 DIAGNOSIS — Z5309 Procedure and treatment not carried out because of other contraindication: Secondary | ICD-10-CM | POA: Diagnosis not present

## 2022-07-01 DIAGNOSIS — T17908A Unspecified foreign body in respiratory tract, part unspecified causing other injury, initial encounter: Secondary | ICD-10-CM | POA: Diagnosis not present

## 2022-07-02 DIAGNOSIS — J45909 Unspecified asthma, uncomplicated: Secondary | ICD-10-CM | POA: Diagnosis not present

## 2022-07-02 DIAGNOSIS — Z4659 Encounter for fitting and adjustment of other gastrointestinal appliance and device: Secondary | ICD-10-CM | POA: Diagnosis not present

## 2022-07-02 DIAGNOSIS — J690 Pneumonitis due to inhalation of food and vomit: Secondary | ICD-10-CM | POA: Diagnosis not present

## 2022-07-02 DIAGNOSIS — R1314 Dysphagia, pharyngoesophageal phase: Secondary | ICD-10-CM | POA: Diagnosis not present

## 2022-07-02 DIAGNOSIS — R509 Fever, unspecified: Secondary | ICD-10-CM | POA: Diagnosis not present

## 2022-07-03 DIAGNOSIS — F411 Generalized anxiety disorder: Secondary | ICD-10-CM | POA: Diagnosis not present

## 2022-07-03 DIAGNOSIS — I255 Ischemic cardiomyopathy: Secondary | ICD-10-CM | POA: Diagnosis not present

## 2022-07-03 DIAGNOSIS — R0902 Hypoxemia: Secondary | ICD-10-CM | POA: Diagnosis not present

## 2022-07-03 DIAGNOSIS — E440 Moderate protein-calorie malnutrition: Secondary | ICD-10-CM | POA: Diagnosis not present

## 2022-07-03 DIAGNOSIS — E1165 Type 2 diabetes mellitus with hyperglycemia: Secondary | ICD-10-CM | POA: Diagnosis not present

## 2022-07-03 DIAGNOSIS — R531 Weakness: Secondary | ICD-10-CM | POA: Diagnosis not present

## 2022-07-03 DIAGNOSIS — N179 Acute kidney failure, unspecified: Secondary | ICD-10-CM | POA: Diagnosis not present

## 2022-07-03 DIAGNOSIS — Z931 Gastrostomy status: Secondary | ICD-10-CM | POA: Diagnosis not present

## 2022-07-03 DIAGNOSIS — Z431 Encounter for attention to gastrostomy: Secondary | ICD-10-CM | POA: Diagnosis not present

## 2022-07-03 DIAGNOSIS — I480 Paroxysmal atrial fibrillation: Secondary | ICD-10-CM | POA: Diagnosis not present

## 2022-07-03 DIAGNOSIS — I4891 Unspecified atrial fibrillation: Secondary | ICD-10-CM | POA: Diagnosis not present

## 2022-07-03 DIAGNOSIS — K224 Dyskinesia of esophagus: Secondary | ICD-10-CM | POA: Diagnosis not present

## 2022-07-03 DIAGNOSIS — Z483 Aftercare following surgery for neoplasm: Secondary | ICD-10-CM | POA: Diagnosis not present

## 2022-07-03 DIAGNOSIS — R1319 Other dysphagia: Secondary | ICD-10-CM | POA: Diagnosis not present

## 2022-07-03 DIAGNOSIS — R1310 Dysphagia, unspecified: Secondary | ICD-10-CM | POA: Diagnosis not present

## 2022-07-03 DIAGNOSIS — E872 Acidosis, unspecified: Secondary | ICD-10-CM | POA: Diagnosis not present

## 2022-07-03 DIAGNOSIS — J690 Pneumonitis due to inhalation of food and vomit: Secondary | ICD-10-CM | POA: Diagnosis not present

## 2022-07-03 DIAGNOSIS — D496 Neoplasm of unspecified behavior of brain: Secondary | ICD-10-CM | POA: Diagnosis not present

## 2022-07-03 DIAGNOSIS — F419 Anxiety disorder, unspecified: Secondary | ICD-10-CM | POA: Diagnosis not present

## 2022-07-03 DIAGNOSIS — Z7409 Other reduced mobility: Secondary | ICD-10-CM | POA: Diagnosis not present

## 2022-07-03 DIAGNOSIS — R2689 Other abnormalities of gait and mobility: Secondary | ICD-10-CM | POA: Diagnosis not present

## 2022-07-06 DIAGNOSIS — I5022 Chronic systolic (congestive) heart failure: Secondary | ICD-10-CM | POA: Diagnosis not present

## 2022-07-06 DIAGNOSIS — R0602 Shortness of breath: Secondary | ICD-10-CM | POA: Diagnosis not present

## 2022-07-06 DIAGNOSIS — R569 Unspecified convulsions: Secondary | ICD-10-CM | POA: Diagnosis not present

## 2022-07-06 DIAGNOSIS — I959 Hypotension, unspecified: Secondary | ICD-10-CM | POA: Diagnosis not present

## 2022-07-06 DIAGNOSIS — R1310 Dysphagia, unspecified: Secondary | ICD-10-CM | POA: Diagnosis not present

## 2022-07-06 DIAGNOSIS — E876 Hypokalemia: Secondary | ICD-10-CM | POA: Diagnosis not present

## 2022-07-06 DIAGNOSIS — I517 Cardiomegaly: Secondary | ICD-10-CM | POA: Diagnosis not present

## 2022-07-06 DIAGNOSIS — I4891 Unspecified atrial fibrillation: Secondary | ICD-10-CM | POA: Diagnosis not present

## 2022-07-06 DIAGNOSIS — K5989 Other specified functional intestinal disorders: Secondary | ICD-10-CM | POA: Diagnosis not present

## 2022-07-06 DIAGNOSIS — R069 Unspecified abnormalities of breathing: Secondary | ICD-10-CM | POA: Diagnosis not present

## 2022-07-06 DIAGNOSIS — Z66 Do not resuscitate: Secondary | ICD-10-CM | POA: Diagnosis not present

## 2022-07-06 DIAGNOSIS — Z79899 Other long term (current) drug therapy: Secondary | ICD-10-CM | POA: Diagnosis not present

## 2022-07-06 DIAGNOSIS — Z8709 Personal history of other diseases of the respiratory system: Secondary | ICD-10-CM | POA: Diagnosis not present

## 2022-07-06 DIAGNOSIS — D320 Benign neoplasm of cerebral meninges: Secondary | ICD-10-CM | POA: Diagnosis not present

## 2022-07-06 DIAGNOSIS — F419 Anxiety disorder, unspecified: Secondary | ICD-10-CM | POA: Diagnosis not present

## 2022-07-06 DIAGNOSIS — J9611 Chronic respiratory failure with hypoxia: Secondary | ICD-10-CM | POA: Diagnosis not present

## 2022-07-06 DIAGNOSIS — R112 Nausea with vomiting, unspecified: Secondary | ICD-10-CM | POA: Diagnosis not present

## 2022-07-06 DIAGNOSIS — I110 Hypertensive heart disease with heart failure: Secondary | ICD-10-CM | POA: Diagnosis not present

## 2022-07-06 DIAGNOSIS — N2889 Other specified disorders of kidney and ureter: Secondary | ICD-10-CM | POA: Diagnosis not present

## 2022-07-06 DIAGNOSIS — Z931 Gastrostomy status: Secondary | ICD-10-CM | POA: Diagnosis not present

## 2022-07-06 DIAGNOSIS — E872 Acidosis, unspecified: Secondary | ICD-10-CM | POA: Diagnosis not present

## 2022-07-06 DIAGNOSIS — J90 Pleural effusion, not elsewhere classified: Secondary | ICD-10-CM | POA: Diagnosis not present

## 2022-07-06 DIAGNOSIS — I5020 Unspecified systolic (congestive) heart failure: Secondary | ICD-10-CM | POA: Diagnosis not present

## 2022-07-06 DIAGNOSIS — I428 Other cardiomyopathies: Secondary | ICD-10-CM | POA: Diagnosis not present

## 2022-07-06 DIAGNOSIS — K838 Other specified diseases of biliary tract: Secondary | ICD-10-CM | POA: Diagnosis not present

## 2022-07-06 DIAGNOSIS — D649 Anemia, unspecified: Secondary | ICD-10-CM | POA: Diagnosis not present

## 2022-07-06 DIAGNOSIS — Z90710 Acquired absence of both cervix and uterus: Secondary | ICD-10-CM | POA: Diagnosis not present

## 2022-07-06 DIAGNOSIS — R918 Other nonspecific abnormal finding of lung field: Secondary | ICD-10-CM | POA: Diagnosis not present

## 2022-07-06 DIAGNOSIS — J690 Pneumonitis due to inhalation of food and vomit: Secondary | ICD-10-CM | POA: Diagnosis not present

## 2022-07-06 DIAGNOSIS — I482 Chronic atrial fibrillation, unspecified: Secondary | ICD-10-CM | POA: Diagnosis not present

## 2022-07-06 DIAGNOSIS — K311 Adult hypertrophic pyloric stenosis: Secondary | ICD-10-CM | POA: Diagnosis not present

## 2022-07-06 DIAGNOSIS — N179 Acute kidney failure, unspecified: Secondary | ICD-10-CM | POA: Diagnosis not present

## 2022-07-08 ENCOUNTER — Telehealth: Payer: Self-pay

## 2022-07-08 ENCOUNTER — Telehealth: Payer: Self-pay | Admitting: Family Medicine

## 2022-07-08 NOTE — Telephone Encounter (Signed)
Tara [name] [phone] called from [facility] about patients medication, Please return call by noon today.

## 2022-07-08 NOTE — Telephone Encounter (Signed)
Spoke with pharmacist and verified medications

## 2022-07-08 NOTE — Telephone Encounter (Signed)
LMTRC

## 2022-07-08 NOTE — Telephone Encounter (Signed)
See other tele msg [facility]

## 2022-07-08 NOTE — Telephone Encounter (Signed)
Tara [name] with [facility] [phone] called stating she is wanting to speak with nurse to complete a med review for patient. Can you please call her between 1:30 pm & 3pm?

## 2022-07-24 DIAGNOSIS — D329 Benign neoplasm of meninges, unspecified: Secondary | ICD-10-CM | POA: Diagnosis not present

## 2022-07-24 DIAGNOSIS — J690 Pneumonitis due to inhalation of food and vomit: Secondary | ICD-10-CM | POA: Diagnosis not present

## 2022-07-24 DIAGNOSIS — I509 Heart failure, unspecified: Secondary | ICD-10-CM | POA: Diagnosis not present

## 2022-07-24 DIAGNOSIS — R1319 Other dysphagia: Secondary | ICD-10-CM | POA: Diagnosis not present

## 2022-07-24 DIAGNOSIS — R0902 Hypoxemia: Secondary | ICD-10-CM | POA: Diagnosis not present

## 2022-07-24 DIAGNOSIS — I110 Hypertensive heart disease with heart failure: Secondary | ICD-10-CM | POA: Diagnosis not present

## 2022-07-24 DIAGNOSIS — Z7901 Long term (current) use of anticoagulants: Secondary | ICD-10-CM | POA: Diagnosis not present

## 2022-07-24 DIAGNOSIS — E872 Acidosis, unspecified: Secondary | ICD-10-CM | POA: Diagnosis not present

## 2022-07-24 DIAGNOSIS — Z431 Encounter for attention to gastrostomy: Secondary | ICD-10-CM | POA: Diagnosis not present

## 2022-07-24 DIAGNOSIS — R29898 Other symptoms and signs involving the musculoskeletal system: Secondary | ICD-10-CM | POA: Diagnosis not present

## 2022-07-24 DIAGNOSIS — Z7409 Other reduced mobility: Secondary | ICD-10-CM | POA: Diagnosis not present

## 2022-07-24 DIAGNOSIS — E440 Moderate protein-calorie malnutrition: Secondary | ICD-10-CM | POA: Diagnosis not present

## 2022-07-24 DIAGNOSIS — G7281 Critical illness myopathy: Secondary | ICD-10-CM | POA: Diagnosis not present

## 2022-07-24 DIAGNOSIS — Z789 Other specified health status: Secondary | ICD-10-CM | POA: Diagnosis not present

## 2022-07-24 DIAGNOSIS — Z483 Aftercare following surgery for neoplasm: Secondary | ICD-10-CM | POA: Diagnosis not present

## 2022-07-24 DIAGNOSIS — D649 Anemia, unspecified: Secondary | ICD-10-CM | POA: Diagnosis not present

## 2022-07-24 DIAGNOSIS — R1310 Dysphagia, unspecified: Secondary | ICD-10-CM | POA: Diagnosis not present

## 2022-07-24 DIAGNOSIS — D496 Neoplasm of unspecified behavior of brain: Secondary | ICD-10-CM | POA: Diagnosis not present

## 2022-07-24 DIAGNOSIS — N179 Acute kidney failure, unspecified: Secondary | ICD-10-CM | POA: Diagnosis not present

## 2022-07-24 DIAGNOSIS — I4891 Unspecified atrial fibrillation: Secondary | ICD-10-CM | POA: Diagnosis not present

## 2022-07-24 DIAGNOSIS — R1312 Dysphagia, oropharyngeal phase: Secondary | ICD-10-CM | POA: Diagnosis not present

## 2022-07-24 DIAGNOSIS — F419 Anxiety disorder, unspecified: Secondary | ICD-10-CM | POA: Diagnosis not present

## 2022-07-24 DIAGNOSIS — I10 Essential (primary) hypertension: Secondary | ICD-10-CM | POA: Diagnosis not present

## 2022-07-24 DIAGNOSIS — I5022 Chronic systolic (congestive) heart failure: Secondary | ICD-10-CM | POA: Diagnosis not present

## 2022-07-24 DIAGNOSIS — Z9889 Other specified postprocedural states: Secondary | ICD-10-CM | POA: Diagnosis not present

## 2022-07-24 DIAGNOSIS — I482 Chronic atrial fibrillation, unspecified: Secondary | ICD-10-CM | POA: Diagnosis not present

## 2022-07-24 DIAGNOSIS — I255 Ischemic cardiomyopathy: Secondary | ICD-10-CM | POA: Diagnosis not present

## 2022-07-24 DIAGNOSIS — R569 Unspecified convulsions: Secondary | ICD-10-CM | POA: Diagnosis not present

## 2022-07-24 DIAGNOSIS — F329 Major depressive disorder, single episode, unspecified: Secondary | ICD-10-CM | POA: Diagnosis not present

## 2022-07-24 DIAGNOSIS — R1314 Dysphagia, pharyngoesophageal phase: Secondary | ICD-10-CM | POA: Diagnosis not present

## 2022-07-24 DIAGNOSIS — I959 Hypotension, unspecified: Secondary | ICD-10-CM | POA: Diagnosis not present

## 2022-07-24 DIAGNOSIS — I480 Paroxysmal atrial fibrillation: Secondary | ICD-10-CM | POA: Diagnosis not present

## 2022-08-12 ENCOUNTER — Telehealth: Payer: Self-pay | Admitting: Family Medicine

## 2022-08-12 ENCOUNTER — Telehealth: Payer: Self-pay

## 2022-08-12 NOTE — Telephone Encounter (Signed)
Transition Care Management Follow-up Telephone Call Date of discharge and from where: [facility] 08/11/2022 How have you been since you were released from the hospital? weak Any questions or concerns? No  Items Reviewed: Did the pt receive and understand the discharge instructions provided? Yes  Medications obtained and verified? Yes  Other? No  Any new allergies since your discharge? No  Dietary orders reviewed? Yes Do you have support at home? Yes   Home Care and Equipment/Supplies: Were home health services ordered? yes If so, what is the name of the agency? [facility]  Has the agency set up a time to come to the patient's home? no Were any new equipment or medical supplies ordered?  No What is the name of the medical supply agency? N/a Were you able to get the supplies/equipment? not applicable Do you have any questions related to the use of the equipment or supplies? No  Functional Questionnaire: (I = Independent and D = Dependent) ADLs: D  Bathing/Dressing- D  Meal Prep- D  Eating- D  Maintaining continence- D  Transferring/Ambulation- D  Managing Meds- D  Follow up appointments reviewed:  PCP Hospital f/u appt confirmed? Yes  Scheduled to see on 08/19/2022 @ 11:00. [name] Hospital f/u appt confirmed? No   Are transportation arrangements needed? No  If their condition worsens, is the pt aware to call PCP or go to the Emergency Dept.? Yes Was the patient provided with contact information for the PCP's office or ED? Yes Was to pt encouraged to call back with questions or concerns? Yes Juanda Crumble, LPN [place] Direct Dial [phone]

## 2022-08-12 NOTE — Telephone Encounter (Signed)
Pt spouse requested to have Dr. Moshe Cipro call him. Would not give reason. He did call about her appt on 11/28 wanting her to be seen with Dr. Moshe Cipro instead of Dr. Court Joy. Explained to patient Dr. Moshe Cipro schedule was full and Dr. Court Joy was helping her out.

## 2022-08-13 ENCOUNTER — Inpatient Hospital Stay (HOSPITAL_COMMUNITY)
Admission: EM | Admit: 2022-08-13 | Discharge: 2022-08-19 | DRG: 177 | Disposition: A | Discharge status: Home Health | Payer: Medicare Other | Attending: Internal Medicine | Admitting: Internal Medicine

## 2022-08-13 ENCOUNTER — Emergency Department (HOSPITAL_COMMUNITY): Payer: Medicare Other

## 2022-08-13 ENCOUNTER — Encounter (HOSPITAL_COMMUNITY): Payer: Self-pay | Admitting: Emergency Medicine

## 2022-08-13 DIAGNOSIS — F419 Anxiety disorder, unspecified: Secondary | ICD-10-CM | POA: Diagnosis present

## 2022-08-13 DIAGNOSIS — K449 Diaphragmatic hernia without obstruction or gangrene: Secondary | ICD-10-CM | POA: Diagnosis not present

## 2022-08-13 DIAGNOSIS — R0902 Hypoxemia: Secondary | ICD-10-CM | POA: Diagnosis not present

## 2022-08-13 DIAGNOSIS — Z931 Gastrostomy status: Secondary | ICD-10-CM

## 2022-08-13 DIAGNOSIS — I110 Hypertensive heart disease with heart failure: Secondary | ICD-10-CM | POA: Diagnosis not present

## 2022-08-13 DIAGNOSIS — I428 Other cardiomyopathies: Secondary | ICD-10-CM | POA: Diagnosis present

## 2022-08-13 DIAGNOSIS — Z8611 Personal history of tuberculosis: Secondary | ICD-10-CM

## 2022-08-13 DIAGNOSIS — Z800 Family history of malignant neoplasm of digestive organs: Secondary | ICD-10-CM

## 2022-08-13 DIAGNOSIS — K219 Gastro-esophageal reflux disease without esophagitis: Secondary | ICD-10-CM | POA: Diagnosis not present

## 2022-08-13 DIAGNOSIS — Z853 Personal history of malignant neoplasm of breast: Secondary | ICD-10-CM

## 2022-08-13 DIAGNOSIS — N179 Acute kidney failure, unspecified: Secondary | ICD-10-CM | POA: Diagnosis not present

## 2022-08-13 DIAGNOSIS — R000 Tachycardia, unspecified: Secondary | ICD-10-CM | POA: Diagnosis not present

## 2022-08-13 DIAGNOSIS — R001 Bradycardia, unspecified: Secondary | ICD-10-CM | POA: Diagnosis not present

## 2022-08-13 DIAGNOSIS — Z23 Encounter for immunization: Secondary | ICD-10-CM | POA: Diagnosis not present

## 2022-08-13 DIAGNOSIS — J690 Pneumonitis due to inhalation of food and vomit: Secondary | ICD-10-CM | POA: Diagnosis not present

## 2022-08-13 DIAGNOSIS — Z823 Family history of stroke: Secondary | ICD-10-CM

## 2022-08-13 DIAGNOSIS — F32A Depression, unspecified: Secondary | ICD-10-CM | POA: Diagnosis not present

## 2022-08-13 DIAGNOSIS — Z923 Personal history of irradiation: Secondary | ICD-10-CM | POA: Diagnosis not present

## 2022-08-13 DIAGNOSIS — G40909 Epilepsy, unspecified, not intractable, without status epilepticus: Secondary | ICD-10-CM | POA: Diagnosis present

## 2022-08-13 DIAGNOSIS — R4781 Slurred speech: Secondary | ICD-10-CM | POA: Diagnosis present

## 2022-08-13 DIAGNOSIS — J9601 Acute respiratory failure with hypoxia: Secondary | ICD-10-CM | POA: Diagnosis present

## 2022-08-13 DIAGNOSIS — E119 Type 2 diabetes mellitus without complications: Secondary | ICD-10-CM | POA: Diagnosis present

## 2022-08-13 DIAGNOSIS — Z801 Family history of malignant neoplasm of trachea, bronchus and lung: Secondary | ICD-10-CM

## 2022-08-13 DIAGNOSIS — I5022 Chronic systolic (congestive) heart failure: Secondary | ICD-10-CM | POA: Diagnosis present

## 2022-08-13 DIAGNOSIS — I482 Chronic atrial fibrillation, unspecified: Secondary | ICD-10-CM | POA: Diagnosis not present

## 2022-08-13 DIAGNOSIS — I4891 Unspecified atrial fibrillation: Secondary | ICD-10-CM | POA: Diagnosis not present

## 2022-08-13 DIAGNOSIS — I10 Essential (primary) hypertension: Secondary | ICD-10-CM | POA: Diagnosis not present

## 2022-08-13 DIAGNOSIS — Z66 Do not resuscitate: Secondary | ICD-10-CM | POA: Diagnosis not present

## 2022-08-13 DIAGNOSIS — I959 Hypotension, unspecified: Secondary | ICD-10-CM | POA: Diagnosis not present

## 2022-08-13 DIAGNOSIS — Z9221 Personal history of antineoplastic chemotherapy: Secondary | ICD-10-CM

## 2022-08-13 DIAGNOSIS — Z8249 Family history of ischemic heart disease and other diseases of the circulatory system: Secondary | ICD-10-CM | POA: Diagnosis not present

## 2022-08-13 DIAGNOSIS — Z86011 Personal history of benign neoplasm of the brain: Secondary | ICD-10-CM

## 2022-08-13 DIAGNOSIS — J189 Pneumonia, unspecified organism: Secondary | ICD-10-CM | POA: Diagnosis not present

## 2022-08-13 DIAGNOSIS — E785 Hyperlipidemia, unspecified: Secondary | ICD-10-CM | POA: Diagnosis not present

## 2022-08-13 DIAGNOSIS — E870 Hyperosmolality and hypernatremia: Secondary | ICD-10-CM | POA: Diagnosis present

## 2022-08-13 DIAGNOSIS — E872 Acidosis, unspecified: Secondary | ICD-10-CM | POA: Diagnosis not present

## 2022-08-13 DIAGNOSIS — K2100 Gastro-esophageal reflux disease with esophagitis, without bleeding: Secondary | ICD-10-CM | POA: Diagnosis not present

## 2022-08-13 DIAGNOSIS — Z90710 Acquired absence of both cervix and uterus: Secondary | ICD-10-CM

## 2022-08-13 DIAGNOSIS — R0689 Other abnormalities of breathing: Secondary | ICD-10-CM | POA: Diagnosis not present

## 2022-08-13 DIAGNOSIS — Z8042 Family history of malignant neoplasm of prostate: Secondary | ICD-10-CM

## 2022-08-13 DIAGNOSIS — Z1152 Encounter for screening for COVID-19: Secondary | ICD-10-CM

## 2022-08-13 LAB — CBC WITH DIFFERENTIAL/PLATELET
Abs Immature Granulocytes: 0.04 10*3/uL (ref 0.00–0.07)
Basophils Absolute: 0 10*3/uL (ref 0.0–0.1)
Basophils Relative: 0 %
Eosinophils Absolute: 0.2 10*3/uL (ref 0.0–0.5)
Eosinophils Relative: 2 %
HCT: 32.5 % — ABNORMAL LOW (ref 36.0–46.0)
Hemoglobin: 10.5 g/dL — ABNORMAL LOW (ref 12.0–15.0)
Immature Granulocytes: 0 %
Lymphocytes Relative: 9 %
Lymphs Abs: 0.9 10*3/uL (ref 0.7–4.0)
MCH: 25.8 pg — ABNORMAL LOW (ref 26.0–34.0)
MCHC: 32.3 g/dL (ref 30.0–36.0)
MCV: 79.9 fL — ABNORMAL LOW (ref 80.0–100.0)
Monocytes Absolute: 0.7 10*3/uL (ref 0.1–1.0)
Monocytes Relative: 7 %
Neutro Abs: 9 10*3/uL — ABNORMAL HIGH (ref 1.7–7.7)
Neutrophils Relative %: 82 %
Platelets: 278 10*3/uL (ref 150–400)
RBC: 4.07 MIL/uL (ref 3.87–5.11)
RDW: 15.6 % — ABNORMAL HIGH (ref 11.5–15.5)
WBC: 10.9 10*3/uL — ABNORMAL HIGH (ref 4.0–10.5)
nRBC: 0 % (ref 0.0–0.2)

## 2022-08-13 LAB — RESP PANEL BY RT-PCR (FLU A&B, COVID) ARPGX2
Influenza A by PCR: NEGATIVE
Influenza B by PCR: NEGATIVE
SARS Coronavirus 2 by RT PCR: NEGATIVE

## 2022-08-13 LAB — BASIC METABOLIC PANEL
Anion gap: 12 (ref 5–15)
BUN: 27 mg/dL — ABNORMAL HIGH (ref 8–23)
CO2: 22 mmol/L (ref 22–32)
Calcium: 9.5 mg/dL (ref 8.9–10.3)
Chloride: 98 mmol/L (ref 98–111)
Creatinine, Ser: 1 mg/dL (ref 0.44–1.00)
GFR, Estimated: 58 mL/min — ABNORMAL LOW (ref >60)
Glucose, Bld: 111 mg/dL — ABNORMAL HIGH (ref 70–99)
Potassium: 4 mmol/L (ref 3.5–5.1)
Sodium: 132 mmol/L — ABNORMAL LOW (ref 135–145)

## 2022-08-13 LAB — LACTIC ACID, PLASMA: Lactic Acid, Venous: 1.6 mmol/L (ref 0.5–1.9)

## 2022-08-13 LAB — BRAIN NATRIURETIC PEPTIDE: B Natriuretic Peptide: 54 pg/mL (ref 0.0–100.0)

## 2022-08-13 MED ORDER — VANCOMYCIN HCL IN DEXTROSE 1-5 GM/200ML-% IV SOLN
1000.0000 mg | Freq: Once | INTRAVENOUS | Status: AC
  Administered 2022-08-14: 1000 mg via INTRAVENOUS
  Filled 2022-08-13: qty 200

## 2022-08-13 MED ORDER — VANCOMYCIN HCL 500 MG/100ML IV SOLN
500.0000 mg | Freq: Once | INTRAVENOUS | Status: AC
  Administered 2022-08-14: 500 mg via INTRAVENOUS
  Filled 2022-08-13 (×2): qty 100

## 2022-08-13 MED ORDER — SODIUM CHLORIDE 0.9 % IV SOLN
2.0000 g | Freq: Once | INTRAVENOUS | Status: AC
  Administered 2022-08-13: 2 g via INTRAVENOUS
  Filled 2022-08-13: qty 12.5

## 2022-08-13 MED ORDER — IPRATROPIUM-ALBUTEROL 0.5-2.5 (3) MG/3ML IN SOLN
3.0000 mL | Freq: Once | RESPIRATORY_TRACT | Status: AC
  Administered 2022-08-13: 3 mL via RESPIRATORY_TRACT
  Filled 2022-08-13: qty 3

## 2022-08-13 MED ORDER — VANCOMYCIN HCL IN DEXTROSE 1-5 GM/200ML-% IV SOLN: 1000.0000 mg | INTRAVENOUS | Status: DC

## 2022-08-13 MED ORDER — ONDANSETRON HCL 4 MG/2ML IJ SOLN
4.0000 mg | Freq: Once | INTRAMUSCULAR | Status: AC
  Administered 2022-08-13: 4 mg via INTRAVENOUS
  Filled 2022-08-13: qty 2

## 2022-08-13 NOTE — ED Triage Notes (Signed)
Pt BIB [facility] from home after son called due to concern for pt having seizures. Pt coughing and spiting on arrival. Pt appears to be attempting to clear her throat.  Pt has PEG tube feeding at home and son discontinued the feeding on EMS arrival.

## 2022-08-13 NOTE — Progress Notes (Signed)
Pharmacy Antibiotic Note  Tara [name] is a 76 y.o. female admitted on 08/13/2022 with pneumonia.  Pharmacy has been consulted for vancomycin dosing. Labs are pending.   Plan: Vancomycin '1500mg'$  IV x1 as loading dose followed by vancomycin '1000mg'$  IV q 24h  Goal trough 15-20 mcg/mL Obtain vancomycin levels PRN per protocol F/u renal func, clinical picture, cultures, MRSA PCR  Height: '4\' 9"'$  (144.8 cm) Weight: 74.7 kg (164 lb 10.9 oz) IBW/kg (Calculated) : 38.6  No data recorded.  No results for input(s): "WBC", "CREATININE", "LATICACIDVEN", "VANCOTROUGH", "VANCOPEAK", "VANCORANDOM", "GENTTROUGH", "GENTPEAK", "GENTRANDOM", "TOBRATROUGH", "TOBRAPEAK", "TOBRARND", "AMIKACINPEAK", "AMIKACINTROU", "AMIKACIN" in the last 168 hours.  CrCl cannot be calculated (Patient's most recent lab result is older than the maximum 21 days allowed.).    Allergies  Allergen Reactions   Aspirin Other (See Comments)    Reports GI bleed--pt is currently taking   Lisinopril Cough   Sudafed [Pseudoephedrine Hcl]     Pt reports she had drainage in throat that made her throat hurt.     Antimicrobials this admission: Cefepime 11/22 x1 Vancomycin 11/22>   Dose adjustments this admission:   Microbiology results: 11/22 MRSA PCR:   Thank you for allowing pharmacy to be a part of this patient's care.   Wilson Singer, PharmD Clinical Pharmacist 08/13/2022 10:40 PM

## 2022-08-13 NOTE — ED Provider Notes (Signed)
[name] Provider Note   CSN: [id number] Arrival date & time: 08/13/22  2112     History  No chief complaint on file.   Tara [name] is a 76 y.o. female with with past medical history cerebral meningioma s/p craniotomy in July 2023, recurrent hospitalizations since June of this year for her seizure disorder, aspiration pneumonia, esophageal dysphagia status post PEG tube placement, CHF, hypertension, diabetes, kidney disease who presents to the ED via EMS from her home with complaints of a frequent cough and vomiting since early this morning. While postictal, patient began to repetitively gag and have trouble breathing that was witnessed by her son and her husband who promptly called EMS to assist patient.  Patient was placed on oxygen by EMS which is continued on arrival to the ED at 2 L.  Husband states that during one of her hospitalizations this year when she had aspiration pneumonia she did require oxygen, however, while she has been in rehab and at home for the last 2 days she has not been on oxygen and was not on it prior to these hospitalizations.  Patient denies fever, trouble swallowing, congestion, rhinorrhea, abdominal pain, diarrhea.  Patient and husband state she has been receiving her tube feeds as scheduled.  Husband is concerned that patient is receiving too many medications/too much liquid at 1 time as she takes all her medications by her PEG tube at once and he believes this is causing her nausea and leading to recurrent aspiration pneumonia.  On arrival on 2 L of oxygen, patient has an oxygen saturation of 94 to 95%.  When removed from the oxygen, she drops down to high 80s-91%.  She is answering questions appropriately though a bit slow to respond.  Husband states that patient's baseline is that she requires assistance with all daily activities and he assists her with getting up and walking around, bathing, and all of her caretaking.    Home  Medications Prior to Admission medications   Medication Sig Start Date End Date Taking? Authorizing Provider  acetaminophen (TYLENOL) 500 MG tablet Take 500-1,000 mg by mouth every 6 (six) hours as needed (FOR PAIN.).    [provider]  amiodarone (PACERONE) 200 MG tablet Take 1 tablet (200 mg total) by mouth daily. 03/13/22   Thurnell Lose, MD  apixaban (ELIQUIS) 5 MG TABS tablet Take 1 tablet (5 mg total) by mouth 2 (two) times daily. 06/07/22   Meyran, Ocie Cornfield, NP  azelastine (ASTELIN) 0.1 % nasal spray Place 1 spray into both nostrils daily as needed for rhinitis. Use in each nostril as directed    [provider]  Cholecalciferol (VITAMIN D) 50 MCG (2000 UT) CAPS Take 2,000 Units by mouth daily.    [provider]  clonazePAM (KLONOPIN) 0.5 MG tablet Take 1 tablet (0.5 mg total) by mouth at bedtime. 01/20/22   [name], MD  dexamethasone (DECADRON) 0.5 MG tablet Take 2 tabs for 3 days, then 1 tab for 3 days then stop. 03/13/22   Thurnell Lose, MD  docusate sodium (COLACE) 100 MG capsule Take 200 mg by mouth 2 (two) times daily as needed for mild constipation.    [provider]  ipratropium-albuterol (DUONEB) 0.5-2.5 (3) MG/3ML SOLN USe 1 vial by nebulization every 6 (six) hours as needed for shortness of breath. 05/30/22 06/29/22  Arrien, Jimmy Picket, MD  JARDIANCE 10 MG TABS tablet TAKE ONE TABLET BY MOUTH ONCE DAILY. Patient taking differently: Take  10 mg by mouth daily. 01/03/22   Larey Dresser, MD  lacosamide (VIMPAT) 200 MG TABS tablet Take 1 tablet (200 mg total) by mouth 2 (two) times daily. 06/07/22   Eustace Moore, MD  levETIRAcetam (KEPPRA) 750 MG tablet Take 1 tablet (750 mg total) by mouth 2 (two) times daily. 06/07/22   Eustace Moore, MD  montelukast (SINGULAIR) 10 MG tablet TAKE ONE TABLET BY MOUTH AT BEDTIME. Patient taking differently: Take 10 mg by mouth at bedtime. 09/03/21   [name], MD  pantoprazole  (PROTONIX) 40 MG tablet TAKE (1) TABLET BY MOUTH TWICE DAILY. Patient taking differently: 40 mg 2 (two) times daily. 11/18/21   [name], MD  Phenylephrine-Witch Hazel (PREPARATION H) 0.25-50 % GEL Apply 1 Application topically daily as needed (hemorrhoid).    [provider]  rosuvastatin (CRESTOR) 20 MG tablet Take 1 tablet (20 mg total) by mouth daily. 01/14/22   [name], MD  sertraline (ZOLOFT) 25 MG tablet Take 1 tablet (25 mg total) by mouth daily. 01/20/22   [name], MD      Allergies    Aspirin, Lisinopril, and Sudafed [pseudoephedrine hcl]    Review of Systems   Review of Systems  Unable to perform ROS: Acuity of condition  limited due to clinical condition of patient, please see HPI for information able to be obtained   Physical Exam Updated Vital Signs BP 120/71   Pulse 62   Resp 14   Ht '4\' 9"'$  (1.448 m)   Wt 74.7 kg   SpO2 97%   BMI 35.64 kg/m  Physical Exam Vitals and nursing note reviewed.  Constitutional:      Appearance: She is ill-appearing. She is not diaphoretic.     Comments: Moderate distress secondary to frequent cough, gagging, generally weak and requires assistance to change positions in bed  HENT:     Head: Normocephalic and atraumatic.     Nose: No congestion or rhinorrhea.     Mouth/Throat:     Mouth: Mucous membranes are moist.     Pharynx: Oropharynx is clear. No oropharyngeal exudate or posterior oropharyngeal erythema.  Eyes:     General: No scleral icterus.    Conjunctiva/sclera: Conjunctivae normal.     Pupils: Pupils are equal, round, and reactive to light.  Cardiovascular:     Rate and Rhythm: Normal rate and regular rhythm.     Heart sounds: Normal heart sounds. No murmur heard.    No gallop.  Pulmonary:     Comments: Breathing at a normal rate with slightly shallow breaths, no wheezing in lung fields, mild crackles present diffusely in all lung fields though do notice improvement/partial  resolution with coughing spells, currently on 2 L of oxygen Chest:     Chest wall: No tenderness.  Abdominal:     General: Abdomen is flat. There is no distension.     Palpations: Abdomen is soft.     Tenderness: There is no abdominal tenderness. There is no guarding or rebound.     Comments: PEG tube in place to left abdomen with no signs of erythema, tenderness, or other signs of infection  Musculoskeletal:     Right lower leg: Edema (1+) present.     Left lower leg: Edema (1+) present.  Skin:    General: Skin is warm and dry.     Capillary Refill: Capillary refill takes less than 2 seconds.     Coloration: Skin is not jaundiced  or pale.     Findings: No rash.  Neurological:     Comments: Patient laying with eyes closed on stretcher but does answer questions appropriately though a bit slowly, she is generally weak in all extremities but this appears to be equal and no focal deficits can be identified, intermittent lipsmacking, normal speech, exam limited due to patient's clinical condition  Psychiatric:     Comments: Limited due to clinical condition     ED Results / Procedures / Treatments   Labs (all labs ordered are listed, but only abnormal results are displayed) Labs Reviewed  BASIC METABOLIC PANEL - Abnormal; Notable for the following components:      Result Value   Sodium 132 (*)    Glucose, Bld 111 (*)    BUN 27 (*)    GFR, Estimated 58 (*)    All other components within normal limits  CBC WITH DIFFERENTIAL/PLATELET - Abnormal; Notable for the following components:   WBC 10.9 (*)    Hemoglobin 10.5 (*)    HCT 32.5 (*)    MCV 79.9 (*)    MCH 25.8 (*)    RDW 15.6 (*)    Neutro Abs 9.0 (*)    All other components within normal limits  RESP PANEL BY RT-PCR (FLU A&B, COVID) ARPGX2  MRSA NEXT GEN BY PCR, NASAL  BRAIN NATRIURETIC PEPTIDE  LACTIC ACID, PLASMA  LACTIC ACID, PLASMA  CBG MONITORING, ED    EKG None  Radiology DG Chest Port 1 View  Result Date:  08/13/2022 CLINICAL DATA:  Hypoxia EXAM: PORTABLE CHEST 1 VIEW COMPARISON:  Chest x-ray 05/28/2022 FINDINGS: The heart size and mediastinal contours are within normal limits. Calcified right hilar lymph nodes are again seen. Both lungs are clear. The visualized skeletal structures are unremarkable. Right axillary surgical clips are again seen. IMPRESSION: No active disease. Old granulomatous disease. Electronically Signed   By: Ronney Asters M.D.   On: 08/13/2022 22:41    Procedures Cardiac monitor shows normal sinus rhythm that when patient is removed from 2 L of oxygen and her sats duration does drop down to high 80s-91%.  Medications Ordered in ED Medications  vancomycin (VANCOCIN) IVPB 1000 mg/200 mL premix (has no administration in time range)    Followed by  vancomycin (VANCOREADY) IVPB 500 mg/100 mL (has no administration in time range)  vancomycin (VANCOCIN) IVPB 1000 mg/200 mL premix (has no administration in time range)  ceFEPIme (MAXIPIME) 2 g in sodium chloride 0.9 % 100 mL IVPB (2 g Intravenous New Bag/Given 08/13/22 2257)  ipratropium-albuterol (DUONEB) 0.5-2.5 (3) MG/3ML nebulizer solution 3 mL (3 mLs Nebulization Given 08/13/22 2338)  ondansetron (ZOFRAN) injection 4 mg (4 mg Intravenous Given 08/13/22 2333)    ED Course/ Medical Decision Making/ A&P                           Medical Decision Making This is a 76 year old female with many comorbidities and frequent hospitalizations since June of this year who presents to the ED after several episodes of vomiting today, an episode of her normal seizure activity witnessed by family, and then a dyspnea spell where pt was unable to breathe for several seconds and had persistent difficulty breathing prompting EMS call.  She was placed on 2 L of oxygen by EMS with improvement in oxygen saturation and brought to the ED where she remains on oxygen.  On arrival, she continues to have frequent coughing spells resulting in  gagging with  complaints of nausea.  She appears fatigued on exam but does say that her breathing has improved since being placed on the oxygen.  As patient has been mostly hospitalized since 5 months ago and just left rehab to go home 2 days ago, patient will need to be admitted for further management of her current symptoms, new oxygen requirement, and further discussion of goals of care.  Long discussion with patient and husband alongside attending physician regarding life-sustaining care such as CPR, intubation and patient states that she does not desire this though does want to be comfortable as she nears the end of life.  Husband confirms that this has been patient's wishes since she became so ill earlier this year.  Chest x-ray resulted and does not show any infiltrates.  However, with her recent admissions for aspiration pneumonia and many risk factors will continue with IV antibiotics and plan for admission of patient. Labs that have resulted so far are reassuring and appear to be at baseline. Nebulizer treatment ordered to help with patient's symptoms as well as Zofran to prevent nausea and vomiting.  Discussed with supervising physician who agrees with the plan and admission to hospitalist team.  Amount and/or Complexity of Data Reviewed Labs: ordered. Decision-making details documented in ED Course. Radiology: ordered. Decision-making details documented in ED Course. ECG/medicine tests: ordered.  Risk Prescription drug management. Decision regarding hospitalization. Decision not to resuscitate or to de-escalate care because of poor prognosis.     Final Clinical Impression(s) / ED Diagnoses Final diagnoses:  None    Rx / DC Orders ED Discharge Orders     None         Turner Daniels 08/13/22 2348    Noemi Chapel, MD 08/14/22 [phone]

## 2022-08-13 NOTE — ED Notes (Signed)
Pt having episode of vomiting and constant coughing.Bsuper,RN

## 2022-08-14 ENCOUNTER — Other Ambulatory Visit: Payer: Self-pay

## 2022-08-14 DIAGNOSIS — F419 Anxiety disorder, unspecified: Secondary | ICD-10-CM | POA: Diagnosis not present

## 2022-08-14 DIAGNOSIS — J9601 Acute respiratory failure with hypoxia: Secondary | ICD-10-CM

## 2022-08-14 DIAGNOSIS — K2100 Gastro-esophageal reflux disease with esophagitis, without bleeding: Secondary | ICD-10-CM

## 2022-08-14 DIAGNOSIS — I5022 Chronic systolic (congestive) heart failure: Secondary | ICD-10-CM

## 2022-08-14 DIAGNOSIS — J690 Pneumonitis due to inhalation of food and vomit: Secondary | ICD-10-CM | POA: Insufficient documentation

## 2022-08-14 DIAGNOSIS — F32A Depression, unspecified: Secondary | ICD-10-CM

## 2022-08-14 DIAGNOSIS — I482 Chronic atrial fibrillation, unspecified: Secondary | ICD-10-CM

## 2022-08-14 LAB — COMPREHENSIVE METABOLIC PANEL
ALT: 27 U/L (ref 0–44)
AST: 25 U/L (ref 15–41)
Albumin: 3.1 g/dL — ABNORMAL LOW (ref 3.5–5.0)
Alkaline Phosphatase: 80 U/L (ref 38–126)
Anion gap: 8 (ref 5–15)
BUN: 26 mg/dL — ABNORMAL HIGH (ref 8–23)
CO2: 22 mmol/L (ref 22–32)
Calcium: 9 mg/dL (ref 8.9–10.3)
Chloride: 101 mmol/L (ref 98–111)
Creatinine, Ser: 0.96 mg/dL (ref 0.44–1.00)
GFR, Estimated: >60 mL/min (ref >60)
Glucose, Bld: 105 mg/dL — ABNORMAL HIGH (ref 70–99)
Potassium: 4.1 mmol/L (ref 3.5–5.1)
Sodium: 131 mmol/L — ABNORMAL LOW (ref 135–145)
Total Bilirubin: 0.6 mg/dL (ref 0.3–1.2)
Total Protein: 7.3 g/dL (ref 6.5–8.1)

## 2022-08-14 LAB — CBC WITH DIFFERENTIAL/PLATELET
Abs Immature Granulocytes: 0.05 10*3/uL (ref 0.00–0.07)
Basophils Absolute: 0 10*3/uL (ref 0.0–0.1)
Basophils Relative: 0 %
Eosinophils Absolute: 0.2 10*3/uL (ref 0.0–0.5)
Eosinophils Relative: 2 %
HCT: 30.2 % — ABNORMAL LOW (ref 36.0–46.0)
Hemoglobin: 9.7 g/dL — ABNORMAL LOW (ref 12.0–15.0)
Immature Granulocytes: 0 %
Lymphocytes Relative: 8 %
Lymphs Abs: 1 10*3/uL (ref 0.7–4.0)
MCH: 25.9 pg — ABNORMAL LOW (ref 26.0–34.0)
MCHC: 32.1 g/dL (ref 30.0–36.0)
MCV: 80.5 fL (ref 80.0–100.0)
Monocytes Absolute: 0.7 10*3/uL (ref 0.1–1.0)
Monocytes Relative: 6 %
Neutro Abs: 10.3 10*3/uL — ABNORMAL HIGH (ref 1.7–7.7)
Neutrophils Relative %: 84 %
Platelets: 273 10*3/uL (ref 150–400)
RBC: 3.75 MIL/uL — ABNORMAL LOW (ref 3.87–5.11)
RDW: 15.7 % — ABNORMAL HIGH (ref 11.5–15.5)
WBC: 12.4 10*3/uL — ABNORMAL HIGH (ref 4.0–10.5)
nRBC: 0 % (ref 0.0–0.2)

## 2022-08-14 LAB — MAGNESIUM: Magnesium: 1.8 mg/dL (ref 1.7–2.4)

## 2022-08-14 LAB — CBG MONITORING, ED: Glucose-Capillary: 111 mg/dL — ABNORMAL HIGH (ref 70–99)

## 2022-08-14 LAB — MRSA NEXT GEN BY PCR, NASAL: MRSA by PCR Next Gen: NOT DETECTED

## 2022-08-14 LAB — LACTIC ACID, PLASMA: Lactic Acid, Venous: 1.5 mmol/L (ref 0.5–1.9)

## 2022-08-14 MED ORDER — ONDANSETRON HCL 4 MG PO TABS: 4.0000 mg | ORAL_TABLET | Freq: Four times a day (QID) | ORAL | Status: DC | PRN

## 2022-08-14 MED ORDER — INFLUENZA VAC A&B SA ADJ QUAD 0.5 ML IM PRSY
0.5000 mL | PREFILLED_SYRINGE | INTRAMUSCULAR | Status: AC
  Administered 2022-08-15: 0.5 mL via INTRAMUSCULAR
  Filled 2022-08-14: qty 0.5

## 2022-08-14 MED ORDER — OXYCODONE HCL 5 MG PO TABS: 5.0000 mg | ORAL_TABLET | ORAL | Status: DC | PRN

## 2022-08-14 MED ORDER — LACOSAMIDE 50 MG PO TABS
200.0000 mg | ORAL_TABLET | Freq: Two times a day (BID) | ORAL | Status: DC
  Administered 2022-08-14 – 2022-08-19 (×11): 200 mg via ORAL
  Filled 2022-08-14 (×11): qty 4

## 2022-08-14 MED ORDER — PANTOPRAZOLE SODIUM 40 MG PO TBEC
40.0000 mg | DELAYED_RELEASE_TABLET | Freq: Every day | ORAL | Status: DC
  Administered 2022-08-14 – 2022-08-19 (×6): 40 mg via ORAL
  Filled 2022-08-14 (×6): qty 1

## 2022-08-14 MED ORDER — ACETAMINOPHEN 650 MG RE SUPP: 650.0000 mg | Freq: Four times a day (QID) | RECTAL | Status: DC | PRN

## 2022-08-14 MED ORDER — SODIUM CHLORIDE 0.9 % IV SOLN
3.0000 g | Freq: Three times a day (TID) | INTRAVENOUS | Status: DC
  Administered 2022-08-14 – 2022-08-18 (×12): 3 g via INTRAVENOUS
  Filled 2022-08-14 (×13): qty 8

## 2022-08-14 MED ORDER — IPRATROPIUM-ALBUTEROL 0.5-2.5 (3) MG/3ML IN SOLN: 3.0000 mL | Freq: Four times a day (QID) | RESPIRATORY_TRACT | Status: DC | PRN

## 2022-08-14 MED ORDER — SERTRALINE HCL 50 MG PO TABS
25.0000 mg | ORAL_TABLET | Freq: Every day | ORAL | Status: DC
  Administered 2022-08-14 – 2022-08-19 (×6): 25 mg via ORAL
  Filled 2022-08-14 (×6): qty 1

## 2022-08-14 MED ORDER — ACETAMINOPHEN 325 MG PO TABS: 650.0000 mg | ORAL_TABLET | Freq: Four times a day (QID) | ORAL | Status: DC | PRN

## 2022-08-14 MED ORDER — MORPHINE SULFATE (PF) 2 MG/ML IV SOLN: 2.0000 mg | INTRAVENOUS | Status: DC | PRN

## 2022-08-14 MED ORDER — ROSUVASTATIN CALCIUM 20 MG PO TABS
20.0000 mg | ORAL_TABLET | Freq: Every day | ORAL | Status: DC
  Administered 2022-08-14 – 2022-08-19 (×6): 20 mg via ORAL
  Filled 2022-08-14 (×6): qty 1

## 2022-08-14 MED ORDER — LEVETIRACETAM 500 MG PO TABS
750.0000 mg | ORAL_TABLET | Freq: Two times a day (BID) | ORAL | Status: DC
  Administered 2022-08-14 – 2022-08-19 (×11): 750 mg via ORAL
  Filled 2022-08-14 (×11): qty 1

## 2022-08-14 MED ORDER — APIXABAN 5 MG PO TABS
5.0000 mg | ORAL_TABLET | Freq: Two times a day (BID) | ORAL | Status: DC
  Administered 2022-08-14 – 2022-08-19 (×11): 5 mg via ORAL
  Filled 2022-08-14 (×11): qty 1

## 2022-08-14 MED ORDER — ONDANSETRON HCL 4 MG/2ML IJ SOLN: 4.0000 mg | Freq: Four times a day (QID) | INTRAMUSCULAR | Status: DC | PRN

## 2022-08-14 MED ORDER — AMIODARONE HCL 200 MG PO TABS
200.0000 mg | ORAL_TABLET | Freq: Every day | ORAL | Status: DC
  Administered 2022-08-14 – 2022-08-19 (×6): 200 mg via ORAL
  Filled 2022-08-14 (×6): qty 1

## 2022-08-14 MED ORDER — SODIUM CHLORIDE 0.9 % IV SOLN: INTRAVENOUS | Status: DC

## 2022-08-14 MED ORDER — MONTELUKAST SODIUM 10 MG PO TABS
10.0000 mg | ORAL_TABLET | Freq: Every day | ORAL | Status: DC
  Administered 2022-08-14 – 2022-08-18 (×5): 10 mg via ORAL
  Filled 2022-08-14 (×5): qty 1

## 2022-08-14 MED ORDER — CLONAZEPAM 0.5 MG PO TABS
0.5000 mg | ORAL_TABLET | Freq: Every day | ORAL | Status: DC
  Administered 2022-08-14 – 2022-08-18 (×5): 0.5 mg via ORAL
  Filled 2022-08-14 (×5): qty 1

## 2022-08-14 NOTE — Assessment & Plan Note (Addendum)
-   With constant coughing following free water flushes and PEG tube nutrition infusion. -Follow recommendations by dietitian regarding rate and velocity; assess for residual and tolerance. -Patient with underlying gastroesophageal dysmotility and large hiatal hernia. -Prognosis overall guarded. -High risk for decompensation and continue with issues aspiration cycle. -remains NPO. -Continue treatment with IV Unasyn.

## 2022-08-14 NOTE — Assessment & Plan Note (Addendum)
-  Continue amiodarone and Eliquis -Rate controlled.

## 2022-08-14 NOTE — Progress Notes (Signed)
Patient seen and examined; admitted after midnight secondary to concerns for seizure activity after episode of aspiration.  Patient with severe esophageal dysmotility chronically receiving PEG tube and n.p.o.  Also with underlying history of meningioma status postcraniotomy and receiving antiepileptics.  Who presented to the hospital after event of aspiration with continued coughing spells, oxygen desaturation and consequent episode of what appears to be seizure like activity.  2 L nasal cannula supplementation in place to maintain saturation.  Currently afebrile.  This will be patient's third episode of aspiration in the last 47-month  Chest x-ray at time of admission demonstrating old granulomatous disease changes along with bronchitic changes; no frank opacities.  Hemodynamically stable at this time.  No fever appreciated.  Please refer to H&P written by Dr. ZClearence Pedfor further info/details on admission.  Plan: -Continue current antibiotic (Unasyn) for aspiration component. -Evaluation by dietitian to further assist with rate disposition on PEG tube feedings in order to minimize aspiration. -Continue supportive care. -try to wean off O2 supplementation. -overall guarded prognosis; patient is DNR.  CBarton DuboisMD [phone]

## 2022-08-14 NOTE — Assessment & Plan Note (Addendum)
-   Last echo was in July which showed an EF of 40-45% with global hypokinesis of the left ventricle determinate diastolic function - BNP 54 -No crackles on exam. -Continue to follow daily weights/strict I's and O's. -Condition is currently compensated.

## 2022-08-14 NOTE — Assessment & Plan Note (Addendum)
Continue PPI ?

## 2022-08-14 NOTE — Assessment & Plan Note (Addendum)
-   Likely secondary to aspiration pneumonia versus aspiration pneumonitis - Still requiring 2 L nasal cannula supplementation -Continue experiencing intermittent episodes of coughing spells. -Patient reports no chest pain. -Restarting PEG tube nutrition infusion and assessing tolerance. -Continue current antibiotics. -Continue as needed bronchodilators and wean off oxygen supplementation as tolerated.

## 2022-08-14 NOTE — TOC Progression Note (Signed)
Transition of Care [facility]) - Progression Note    Patient Details  Name: Tara [name] MRN: [id number] Date of Birth: 1946-04-12  [facility]) CM/SW Contact  Boneta Lucks, RN Phone Number: 08/14/2022, 11:28 AM  Clinical Narrative:   Patient admitted with Acute respiratory failure with hypoxia. Recently discharged from inpatient rehab at [facility]. Now on oxygen, has had 3 aspiration in 3 months. Needs dietian consult for peg rate change. TOC to follow for discharge needs. Not active with any home health.     Barriers to Discharge: Continued Medical Work up  Expected Discharge Plan and Services      Readmission Risk Interventions    03/06/2022   12:36 PM  Readmission Risk Prevention Plan  Transportation Screening Complete  HRI or [place] Complete  Social Work Consult for [place] Planning/Counseling Complete

## 2022-08-14 NOTE — ED Notes (Signed)
Dr Darrell Jewel at bedside.Bryson Corona Edd Fabian

## 2022-08-14 NOTE — H&P (Signed)
History and Physical    Patient: Tara [name] JJK:[id number] DOB: [birth year] DOA: 08/13/2022 DOS: the patient was seen and examined on 08/14/2022 PCP: [name], MD  Patient coming from: Home  Chief Complaint: Possibly having a seizure  HPI: ELETHA CULBERTSON is a 76 y.o. female with medical history significant of anxiety, depression, GERD, history of meningioma, history of breast cancer, hyperlipidemia, hypertension, nonischemic myopathy, systolic CHF, and more presents to the ED with a chief complaint of possibly having a seizure.  Patient reports that it is her husband at bedside.  He provides most of the history.  He reports that patient is on continuous PEG feeds.  Every time he gets her medicine and free water flush she goes into a coughing fit.  He is not yet had home nursing out to show him how to check residuals.  She gets free water flushes twice daily when she takes her medicine.  He reports today when she had her free water flushes started coughing so bad that she began shaking and biting her lip.  She had slurred speech during this episode.  He reports it looked like a seizure to him.  He reports that patient is coughing and spitting up constantly since that time.  When she had an episode of nausea and vomiting it was nonbloody, undigested food.  She has had some pink-tinged sputum when she is coughing.  Patient takes nothing by mouth.  She is not on oxygen at home.  She denies any chest pain or palpitations.  She admits to dyspnea.  Husband reports that patient was just discharged from the hospital 2 days ago.  Possibly that was sent in another system because I do not see an chart review.  He reports even in the hospital every time she had a free water flush she was coughing and spitting up like that.  Any fevers.  No further complaints at this time. Review of Systems: As mentioned in the history of present illness. All other systems reviewed and are negative. Past Medical  History:  Diagnosis Date   Abnormal mammogram of right breast 07/29/2017   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   Anxiety    Breast cancer ([place]) 2008   right - s/p lumpectomy->chemo, radiation   Depression    Dysrhythmia    hx SVT   Family history of colon cancer    Family history of prostate cancer    GERD (gastroesophageal reflux disease)    H/O: hysterectomy    Hiatal hernia    History of cancer chemotherapy    History of radiation therapy    Hyperlipidemia    Hypertension 01/25/2018   Nonischemic cardiomyopathy ([place])    Personal history of radiation therapy 01/05/209   SVT (supraventricular tachycardia)    short RP SVT documented 3/71   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[employer])    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)   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   Past Surgical History:  Procedure Laterality Date   ABDOMINAL HYSTERECTOMY  1994   fibroids,    BIOPSY  06/19/2016   Procedure: BIOPSY;  Surgeon: Daneil Dolin, MD;  [facility];  Service: Endoscopy;;  gastric duodenum   BREAST BIOPSY Right 12/16/2006   malignant   BREAST EXCISIONAL BIOPSY Right 2018   benign lumpectomy   BREAST LUMPECTOMY Right 02/2007   BREAST LUMPECTOMY WITH RADIOACTIVE SEED LOCALIZATION Right 07/29/2017  Procedure: RIGHT BREAST LUMPECTOMY WITH RADIOACTIVE SEED LOCALIZATION;  Surgeon: Fanny Skates, MD;  Location: [facility];  Service: General;  Laterality: Right;   BREAST SURGERY Right 2008   lumpectomy, cancer   CARDIAC CATHETERIZATION     CHOLECYSTECTOMY  1999   COLONOSCOPY  2008   Dr. Oneida Alar: multiple polyps. Path not available at time of visit.    COLONOSCOPY WITH PROPOFOL N/A 04/25/2014   Dr. Oneida Alar: Multiple tubular adenomas removed. Diverticulosis. Moderate internal hemorrhoids. Next colonoscopy planned for August 2018.   CRANIOTOMY Bilateral 03/24/2022   Procedure: Bifrontal craniotomy for resection of meningioma;  Surgeon: Ashok Pall,  MD;  Location: [facility];  Service: Neurosurgery;  Laterality: Bilateral;   ESOPHAGOGASTRODUODENOSCOPY (EGD) WITH PROPOFOL N/A 06/19/2016   Procedure: ESOPHAGOGASTRODUODENOSCOPY (EGD) WITH PROPOFOL;  Surgeon: Daneil Dolin, MD;  [facility];  Service: Endoscopy;  Laterality: N/A;   MASTECTOMY W/ SENTINEL NODE BIOPSY Right 06/09/2018   Procedure: RIGHT TOTAL MASTECTOMY WITH SENTINEL LYMPH NODE BIOPSY;  Surgeon: Fanny Skates, MD;  Location: [facility];  Service: General;  Laterality: Right;   POLYPECTOMY N/A 04/25/2014   Procedure: POLYPECTOMY;  Surgeon: Danie Binder, MD;  Location: [facility];  Service: Endoscopy;  Laterality: N/A;  Ascending and Decending Colon x3 , Transverse colon x2, rectal   PORTACATH PLACEMENT N/A 06/09/2018   Procedure: INSERTION PORT-A-CATH;  Surgeon: Fanny Skates, MD;  Location: [facility];  Service: General;  Laterality: N/A;   RIGHT/LEFT HEART CATH AND CORONARY ANGIOGRAPHY N/A 07/10/2017   Procedure: RIGHT/LEFT HEART CATH AND CORONARY ANGIOGRAPHY;  Surgeon: Larey Dresser, MD;  Location: [facility];  Service: Cardiovascular;  Laterality: N/A;   Social History:  reports that she has never smoked. She has never used smokeless tobacco. She reports that she does not drink alcohol and does not use drugs.  Allergies  Allergen Reactions   Aspirin Other (See Comments)    Reports GI bleed--pt is currently taking   Lisinopril Cough   Sudafed [Pseudoephedrine Hcl]     Pt reports she had drainage in throat that made her throat hurt.     Family History  Problem Relation Age of Onset   Colon cancer Father 62       died at age 69   Hypertension Sister    Heart disease Sister    Cancer Sister        unknown form   Dementia Mother    Stroke Mother 12       left hemiparesis   Cancer Brother 34       prostate   Cancer Paternal Aunt        NOS   Lung cancer Paternal Uncle    Other Paternal Uncle        lightning strike   Other Paternal Uncle        hit by  train   Other Paternal 58        old age   63 Cousin        NOS pat first cousin   Cancer Cousin        NOS pat first cousin   Prostate cancer Cousin        pat first cousin    Prior to Admission medications   Medication Sig Start Date End Date Taking? Authorizing Provider  acetaminophen (TYLENOL) 500 MG tablet Take 500-1,000 mg by mouth every 6 (six) hours as needed (FOR PAIN.).    [provider]  amiodarone (PACERONE) 200 MG tablet Take  1 tablet (200 mg total) by mouth daily. 03/13/22   Thurnell Lose, MD  apixaban (ELIQUIS) 5 MG TABS tablet Take 1 tablet (5 mg total) by mouth 2 (two) times daily. 06/07/22   Meyran, Ocie Cornfield, NP  azelastine (ASTELIN) 0.1 % nasal spray Place 1 spray into both nostrils daily as needed for rhinitis. Use in each nostril as directed    [provider]  Cholecalciferol (VITAMIN D) 50 MCG (2000 UT) CAPS Take 2,000 Units by mouth daily.    [provider]  clonazePAM (KLONOPIN) 0.5 MG tablet Take 1 tablet (0.5 mg total) by mouth at bedtime. 01/20/22   [name], MD  dexamethasone (DECADRON) 0.5 MG tablet Take 2 tabs for 3 days, then 1 tab for 3 days then stop. 03/13/22   Thurnell Lose, MD  docusate sodium (COLACE) 100 MG capsule Take 200 mg by mouth 2 (two) times daily as needed for mild constipation.    [provider]  ipratropium-albuterol (DUONEB) 0.5-2.5 (3) MG/3ML SOLN USe 1 vial by nebulization every 6 (six) hours as needed for shortness of breath. 05/30/22 06/29/22  Arrien, Jimmy Picket, MD  JARDIANCE 10 MG TABS tablet TAKE ONE TABLET BY MOUTH ONCE DAILY. Patient taking differently: Take 10 mg by mouth daily. 01/03/22   Larey Dresser, MD  lacosamide (VIMPAT) 200 MG TABS tablet Take 1 tablet (200 mg total) by mouth 2 (two) times daily. 06/07/22   Eustace Moore, MD  levETIRAcetam (KEPPRA) 750 MG tablet Take 1 tablet (750 mg total) by mouth 2 (two) times daily. 06/07/22   Eustace Moore, MD   montelukast (SINGULAIR) 10 MG tablet TAKE ONE TABLET BY MOUTH AT BEDTIME. Patient taking differently: Take 10 mg by mouth at bedtime. 09/03/21   [name], MD  pantoprazole (PROTONIX) 40 MG tablet TAKE (1) TABLET BY MOUTH TWICE DAILY. Patient taking differently: 40 mg 2 (two) times daily. 11/18/21   [name], MD  Phenylephrine-Witch Hazel (PREPARATION H) 0.25-50 % GEL Apply 1 Application topically daily as needed (hemorrhoid).    [provider]  rosuvastatin (CRESTOR) 20 MG tablet Take 1 tablet (20 mg total) by mouth daily. 01/14/22   [name], MD  sertraline (ZOLOFT) 25 MG tablet Take 1 tablet (25 mg total) by mouth daily. 01/20/22   [name], MD    Physical Exam: Vitals:   08/14/22 0120 08/14/22 0130 08/14/22 0230 08/14/22 0346  BP:  (!) 103/53 109/76 114/64  Pulse: 70 67 75 67  Resp: 18 13 (!) 23 20  Temp:  98.1 F (36.7 C)  98.2 F (36.8 C)  TempSrc:  Axillary  Axillary  SpO2: 99% 96% 92% 95%  Weight:      Height:       1.  General: Patient lying supine in bed, head of bed elevated, constantly coughing and spitting   2. Psychiatric: Alert and oriented x 3, mood and behavior normal for situation, pleasant and cooperative with exam   3. Neurologic: Speech and language are normal, face is symmetric, moves all 4 extremities voluntarily, at baseline without acute deficits on limited exam   4. HEENMT:  Head is atraumatic, normocephalic, pupils reactive to light, neck is supple, trachea is midline, mucous membranes are moist   5. Respiratory : Rhonchi present bilaterally, constant cough with spitting, no wheezing, maintaining oxygen saturations with 2 L nasal cannula   6. Cardiovascular : Heart rate normal, rhythm is regular, no murmurs, rubs or gallops,  no peripheral edema, peripheral pulses palpated   7. Gastrointestinal:  Abdomen is soft, nondistended, nontender to palpation bowel sounds active, no masses or organomegaly  palpated   8. Skin:  Skin is warm, dry and intact without rashes, acute lesions, or ulcers on limited exam   9.Musculoskeletal:  No acute deformities or trauma, no asymmetry in tone, no peripheral edema, peripheral pulses palpated, no tenderness to palpation in the extremities  Data Reviewed: In the ED afebrile, heart rate 62-103, respiratory rate 14-, blood pressure 119/71-138/88, satting 98% on 2 L nasal cannula No leukocytosis with white blood cell count 10 globin stable at 10.5, platelets 278 Chemistries unremarkable BNP 54 Lactic acid 1.6 COVID and flu negative MRSA negative Chest x-ray shows no active disease Reassessment 7 fibrillation G-tube Note, vancomycin patient kept n.p.o. Admission requested for aspiration pneumonia and adjustments of PEG tube feeds  Assessment and Plan: * Acute respiratory failure with hypoxia (HCC) - Likely secondary to aspiration pneumonia versus aspiration pneumonitis - No oxygen at baseline - Requiring 2 L nasal cannula to maintain oxygen saturations here - Chest x-ray shows no active disease - BNP is normal at 54 - Continue Lyrica - Continue DuoNeb -The underlying cause - Wean off O2 as tolerated - Continue to monitor  Chronic systolic CHF (congestive heart failure) (HCC) - Currently euvolemic - Last echo was in July which showed an EF of 40-45% with global hypokinesis of the left ventricle determinate diastolic function - Monitor fluid status closely - Continue to monitor  Anxiety and depression - Continue Zoloft, and as needed Klonopin  GERD - Continue PPI  Atrial fibrillation, chronic (HCC) - Continue amiodarone and Eliquis  Aspiration pneumonia (HCC) - With constant coughing following free water flushes with PEG tube - Patient is already n.p.o. so speech therapy consult not ordered at this time - Continue n.p.o. with PEG tube - Consult registered dietitian, as patient may be having elevated residuals, eval for change to  bolus feeds rather than continuous feeds - Patient was started on vancomycin and cefepime in the ED for HCAP - Given that its likely aspiration, start Unasyn - Continue to monitor      Advance Care Planning:   Code Status: DNR  Consults: Consulted   Family Communication: Husband at bedside  Severity of Illness: The appropriate patient status for this patient is INPATIENT. Inpatient status is judged to be reasonable and necessary in order to provide the required intensity of service to ensure the patient's safety. The patient's presenting symptoms, physical exam findings, and initial radiographic and laboratory data in the context of their chronic comorbidities is felt to place them at high risk for further clinical deterioration. Furthermore, it is not anticipated that the patient will be medically stable for discharge from the hospital within 2 midnights of admission.   * I certify that at the point of admission it is my clinical judgment that the patient will require inpatient hospital care spanning beyond 2 midnights from the point of admission due to high intensity of service, high risk for further deterioration and high frequency of surveillance required.*  Author: Rolla Plate, DO 08/14/2022 5:03 AM  For on call review [email or web address].

## 2022-08-14 NOTE — Assessment & Plan Note (Addendum)
-  Continue Zoloft, and as needed Klonopin -overall stable mood appreciated.

## 2022-08-14 NOTE — ED Notes (Signed)
Sputum culture cup provided to patient. Tara Corona Edd Fabian

## 2022-08-15 DIAGNOSIS — I482 Chronic atrial fibrillation, unspecified: Secondary | ICD-10-CM | POA: Diagnosis not present

## 2022-08-15 DIAGNOSIS — K2100 Gastro-esophageal reflux disease with esophagitis, without bleeding: Secondary | ICD-10-CM | POA: Diagnosis not present

## 2022-08-15 DIAGNOSIS — J690 Pneumonitis due to inhalation of food and vomit: Secondary | ICD-10-CM | POA: Diagnosis not present

## 2022-08-15 DIAGNOSIS — J9601 Acute respiratory failure with hypoxia: Secondary | ICD-10-CM | POA: Diagnosis not present

## 2022-08-15 MED ORDER — PROSOURCE TF20 ENFIT COMPATIBL EN LIQD
60.0000 mL | Freq: Every day | ENTERAL | Status: DC
  Administered 2022-08-15 – 2022-08-19 (×5): 60 mL
  Filled 2022-08-15 (×4): qty 60

## 2022-08-15 MED ORDER — JEVITY 1.5 CAL/FIBER PO LIQD
1000.0000 mL | ORAL | Status: DC
  Administered 2022-08-15 – 2022-08-18 (×4): 1000 mL
  Filled 2022-08-15 (×6): qty 1000

## 2022-08-15 NOTE — Care Management Important Message (Signed)
Important Message  Patient Details  Name: Tara [name] MRN: [id number] Date of Birth: 03-02-1946   Medicare Important Message Given:  Yes     Tommy Medal 08/15/2022, 2:08 PM

## 2022-08-15 NOTE — Progress Notes (Addendum)
Initial Nutrition Assessment  DOCUMENTATION CODES:   Obesity unspecified  INTERVENTION:  Start tube feeding ->Continuous Jevity 1.5 @ 40 ml/hr   ProSource TF20 (60 ml) daily  Provides 1520 kcal, 81 gr protein, 730 ml water  If no IVF add 200 ml TID per tube daily in addition to medication flushes  NUTRITION DIAGNOSIS:   Inadequate oral intake related to dysphagia (history) as evidenced by NPO status.   GOAL:  Patient will meet greater than or equal to 90% of their needs  MONITOR:   TF tolerance, Weight trends, Labs REASON FOR ASSESSMENT:   Consult Enteral/tube feeding initiation and management  ASSESSMENT: Patient is a 76 yo female with hx of aspiration pneumonia, CHF, GERD, hiatal hernia, breast cancer. Presents with concerns for seizure activity and possible aspiration.  Patient NPO with dependence on enteral feeding via PEG. Placed <2 months ago at [facility] per spouse. Patient on regular diet during hospitalization [facility] 06/04/22.   Husband is bedside and reports patient in and out of the hospital and rehab units most of the past 6 months. Patient on continuous tube feeding at home the past 3 days. Head of bed elevated at all times. He crushes meds and flushes tube before and after administering each one.    Medications: Keppra, protonix, crestor   Weight history stable- 74.7 kg.   IVF: NS@ 75 ml/hr      Latest Ref Rng & Units 08/14/2022    5:45 AM 08/13/2022   10:39 PM 06/02/2022    2:18 AM  BMP  Glucose 70 - 99 mg/dL 105  111  96   BUN 8 - 23 mg/dL '26  27  18   '$ Creatinine 0.44 - 1.00 mg/dL 0.96  1.00  1.04   Sodium 135 - 145 mmol/L 131  132  137   Potassium 3.5 - 5.1 mmol/L 4.1  4.0  3.6   Chloride 98 - 111 mmol/L 101  98  107   CO2 22 - 32 mmol/L '22  22  22   '$ Calcium 8.9 - 10.3 mg/dL 9.0  9.5  8.9      Diet Order:   Diet Order             Diet NPO time specified Except for: Sips with Meds, Ice Chips  Diet effective now                    EDUCATION NEEDS:  No education needs have been identified at this time  Skin:  Skin Assessment: Reviewed RN Assessment  Last BM:  11/22  Height:   Ht Readings from Last 1 Encounters:  08/13/22 '4\' 9"'$  (1.448 m)    Weight:   Wt Readings from Last 1 Encounters:  08/13/22 74.7 kg    Ideal Body Weight:   45 kg  BMI:  Body mass index is 35.64 kg/m.  Estimated Nutritional Needs:   Kcal:  1500-1700  Protein:  80-85 gr  Fluid:  < 2 liters daily  Colman Cater MS,RD,CSG,LDN Contact: Shea Evans

## 2022-08-15 NOTE — Progress Notes (Signed)
Progress Note   Patient: Tara [name] DOB: 1946/03/21 DOA: 08/13/2022     2 DOS: the patient was seen and examined on 08/15/2022   Brief hospital course: As per H&P written by Dr.Zierle-Ghosh on 08/14/22 Tara [name] is a 76 y.o. female with medical history significant of anxiety, depression, GERD, history of meningioma, history of breast cancer, hyperlipidemia, hypertension, nonischemic myopathy, systolic CHF, and more presents to the ED with a chief complaint of possibly having a seizure.  Patient reports that it is her husband at bedside.  He provides most of the history.  He reports that patient is on continuous PEG feeds.  Every time he gets her medicine and free water flush she goes into a coughing fit.  He is not yet had home nursing out to show him how to check residuals.  She gets free water flushes twice daily when she takes her medicine.  He reports today when she had her free water flushes started coughing so bad that she began shaking and biting her lip.  She had slurred speech during this episode.  He reports it looked like a seizure to him.  He reports that patient is coughing and spitting up constantly since that time.  When she had an episode of nausea and vomiting it was nonbloody, undigested food.  She has had some pink-tinged sputum when she is coughing.  Patient takes nothing by mouth.  She is not on oxygen at home.  She denies any chest pain or palpitations.  She admits to dyspnea.  Husband reports that patient was just discharged from the hospital 2 days ago.  Possibly that was sent in another system because I do not see an chart review.  He reports even in the hospital every time she had a free water flush she was coughing and spitting up like that.  Any fevers.  No further complaints at this time.   Assessment and Plan: * Acute respiratory failure with hypoxia (HCC) - Likely secondary to aspiration pneumonia versus aspiration pneumonitis - Still requiring 2 L  nasal cannula supplementation -Continue experiencing intermittent episodes of coughing spells. -Patient reports no chest pain. -Restarting PEG tube nutrition infusion and assessing tolerance. -Continue current antibiotics. -Continue as needed bronchodilators and wean off oxygen supplementation as tolerated.   Chronic systolic CHF (congestive heart failure) (HCC) - Last echo was in July which showed an EF of 40-45% with global hypokinesis of the left ventricle determinate diastolic function - BNP 54 -No crackles on exam. -Continue to follow daily weights/strict I's and O's. -Condition is currently compensated.  Anxiety and depression -Continue Zoloft, and as needed Klonopin -overall stable mood appreciated.  GERD -Continue PPI  Atrial fibrillation, chronic (HCC) -Continue amiodarone and Eliquis -Rate controlled.  Aspiration pneumonia ([place]) - With constant coughing following free water flushes and PEG tube nutrition infusion. -Follow recommendations by dietitian regarding rate and velocity; assess for residual and tolerance. -Patient with underlying gastroesophageal dysmotility and large hiatal hernia. -Prognosis overall guarded. -High risk for decompensation and continue with issues aspiration cycle. -remains NPO. -Continue treatment with IV Unasyn.  History of seizure disorder following craniotomy -Continue current antiplatelet drugs -No seizure activity appreciated.  Subjective:  Afebrile, no chest pain, no nausea or vomiting currently.  Still requiring 2 L nasal cannula supplementation.  No signs of seizure appreciated.  Physical Exam: Vitals:   08/14/22 1500 08/14/22 2210 08/15/22 0333 08/15/22 1225  BP: (!) 93/48 (!) 102/50 (!) 104/50 (!) 103/47  Pulse: 66 67 65  65  Resp: '16 14 20 20  '$ Temp: (!) 97.4 F (36.3 C) 98.1 F (36.7 C) 98.3 F (36.8 C) 98 F (36.7 C)  TempSrc: Oral Oral Oral Oral  SpO2: 100% 100% 100% 100%  Weight:      Height:       General  exam: Alert, awake, oriented x 3; chronically ill in appearance and deconditioned.  Experiencing intermittent coughing spells and requiring 2 L nasal cannula supplementation. Respiratory system: Positive rhonchi bilaterally; no wheezing, no crackles.  No using accessory muscles. Cardiovascular system: Rate controlled, no rubs, no gallops.  No JVD on exam. Gastrointestinal system: Abdomen is nondistended, soft and nontender.  Positive bowel sounds.  PEG tube in place. Central nervous system: Alert and oriented. No new focal neurological deficits. Extremities: No cyanosis, clubbing or edema. Skin: No petechiae. Psychiatry: Mood & affect appropriate.   Data Reviewed: No new data today.  Family Communication: No family at bedside on today's evaluation.  Disposition: Status is: Inpatient Remains inpatient appropriate because: Continue IV antibiotics for treatment of aspiration pneumonia/pneumonitis.   Planned Discharge Destination: Home with Home Health  Time spent: 35 minutes  Author: Barton Dubois, MD 08/15/2022 5:55 PM  For on call review [email or web address].

## 2022-08-16 DIAGNOSIS — J9601 Acute respiratory failure with hypoxia: Secondary | ICD-10-CM | POA: Diagnosis not present

## 2022-08-16 DIAGNOSIS — J690 Pneumonitis due to inhalation of food and vomit: Secondary | ICD-10-CM | POA: Diagnosis not present

## 2022-08-16 DIAGNOSIS — I482 Chronic atrial fibrillation, unspecified: Secondary | ICD-10-CM | POA: Diagnosis not present

## 2022-08-16 DIAGNOSIS — K2100 Gastro-esophageal reflux disease with esophagitis, without bleeding: Secondary | ICD-10-CM | POA: Diagnosis not present

## 2022-08-16 LAB — CBC
HCT: 26.7 % — ABNORMAL LOW (ref 36.0–46.0)
Hemoglobin: 8.5 g/dL — ABNORMAL LOW (ref 12.0–15.0)
MCH: 26.4 pg (ref 26.0–34.0)
MCHC: 31.8 g/dL (ref 30.0–36.0)
MCV: 82.9 fL (ref 80.0–100.0)
Platelets: 227 10*3/uL (ref 150–400)
RBC: 3.22 MIL/uL — ABNORMAL LOW (ref 3.87–5.11)
RDW: 15.6 % — ABNORMAL HIGH (ref 11.5–15.5)
WBC: 7.4 10*3/uL (ref 4.0–10.5)
nRBC: 0 % (ref 0.0–0.2)

## 2022-08-16 LAB — BASIC METABOLIC PANEL
Anion gap: 7 (ref 5–15)
BUN: 15 mg/dL (ref 8–23)
CO2: 22 mmol/L (ref 22–32)
Calcium: 8.4 mg/dL — ABNORMAL LOW (ref 8.9–10.3)
Chloride: 113 mmol/L — ABNORMAL HIGH (ref 98–111)
Creatinine, Ser: 0.9 mg/dL (ref 0.44–1.00)
GFR, Estimated: >60 mL/min (ref >60)
Glucose, Bld: 124 mg/dL — ABNORMAL HIGH (ref 70–99)
Potassium: 3.9 mmol/L (ref 3.5–5.1)
Sodium: 142 mmol/L (ref 135–145)

## 2022-08-16 NOTE — Progress Notes (Signed)
Progress Note   Patient: Tara [name] DOB: 08/31/1946 DOA: 08/13/2022     3 DOS: the patient was seen and examined on 08/16/2022   Brief hospital course: As per H&P written by Dr.Zierle-Ghosh on 08/14/22 Tara [name] is a 76 y.o. female with medical history significant of anxiety, depression, GERD, history of meningioma, history of breast cancer, hyperlipidemia, hypertension, nonischemic myopathy, systolic CHF, and more presents to the ED with a chief complaint of possibly having a seizure.  Patient reports that it is her husband at bedside.  He provides most of the history.  He reports that patient is on continuous PEG feeds.  Every time he gets her medicine and free water flush she goes into a coughing fit.  He is not yet had home nursing out to show him how to check residuals.  She gets free water flushes twice daily when she takes her medicine.  He reports today when she had her free water flushes started coughing so bad that she began shaking and biting her lip.  She had slurred speech during this episode.  He reports it looked like a seizure to him.  He reports that patient is coughing and spitting up constantly since that time.  When she had an episode of nausea and vomiting it was nonbloody, undigested food.  She has had some pink-tinged sputum when she is coughing.  Patient takes nothing by mouth.  She is not on oxygen at home.  She denies any chest pain or palpitations.  She admits to dyspnea.  Husband reports that patient was just discharged from the hospital 2 days ago.  Possibly that was sent in another system because I do not see an chart review.  He reports even in the hospital every time she had a free water flush she was coughing and spitting up like that.  Any fevers.  No further complaints at this time.   Assessment and Plan: * Acute respiratory failure with hypoxia (HCC) - Likely secondary to aspiration pneumonia versus aspiration pneumonitis - Still requiring 2 L  nasal cannula supplementation -Continue experiencing intermittent episodes of coughing spells. -Patient reports no chest pain. -Restarting PEG tube nutrition infusion and assessing tolerance. -Continue current antibiotics. -Continue as needed bronchodilators and wean off oxygen supplementation as tolerated.   Chronic systolic CHF (congestive heart failure) (HCC) - Last echo was in July which showed an EF of 40-45% with global hypokinesis of the left ventricle determinate diastolic function - BNP 54 -No crackles on exam. -Continue to follow daily weights/strict I's and O's. -Condition is currently compensated.  Anxiety and depression -Continue Zoloft, and as needed Klonopin -overall stable mood appreciated.  GERD -Continue PPI  Atrial fibrillation, chronic (HCC) -Continue amiodarone and Eliquis -Rate controlled.  Aspiration pneumonia ([place]) - With constant coughing following free water flushes and PEG tube nutrition infusion. -Follow recommendations by dietitian regarding rate and velocity; assess for residual and tolerance. -Patient with underlying gastroesophageal dysmotility and large hiatal hernia. -Prognosis overall guarded. -High risk for decompensation and continue with issues aspiration cycle. -remains NPO. -Continue treatment with IV Unasyn.  History of seizure disorder following craniotomy -Continue the use of current antiepileptic medications -No seizure activity appreciated.  Subjective:  No fever, no chest pain, no nausea or vomiting.  Good saturations on 2 L supplementation demonstrated.  Continued to have coughing spells.  Sleepy/somnolent today.  No overnight events.  So far demonstrating good tolerance to tube feedings at adjusted rate.  Physical Exam: Vitals:   08/15/22  1225 08/15/22 2020 08/16/22 0557 08/16/22 1357  BP: (!) 103/47 (!) 93/47 (!) 106/56 (!) 103/53  Pulse: 65 60 68 62  Resp: '20 18 14   '$ Temp: 98 F (36.7 C) 98.8 F (37.1 C) 98 F (36.7  C) 97.6 F (36.4 C)  TempSrc: Oral Oral Oral   SpO2: 100% 100% 100% 100%  Weight:      Height:       General exam: Alert, awake, oriented x 3; sleepy today; 2 L nasal cannula in place.  No fever, no chest pain, no nausea or vomiting reported.  Continues to have intermittent coughing spells. Respiratory system: Positive rhonchi bilaterally; no wheezing, no using accessory muscle.  2 L nasal, supplementation in place. Cardiovascular system:RRR. No rubs or gallops. Gastrointestinal system: Abdomen is nondistended, soft and nontender. No organomegaly or masses felt. Normal bowel sounds heard.  PEG tube in place. Central nervous system: No new focal neurological deficits. Extremities: No cyanosis or clubbing; trace edema appreciated bilaterally. Skin: No petechiae. Psychiatry: Mood & affect appropriate.    Data Reviewed: No new data today.  Family Communication: No family at bedside on today's evaluation.  Disposition: Status is: Inpatient Remains inpatient appropriate because: Continue IV antibiotics for treatment of aspiration pneumonia/pneumonitis.   Planned Discharge Destination: Home with Home Health  Time spent: 35 minutes  Author: Barton Dubois, MD 08/16/2022 4:33 PM  For on call review [email or web address].

## 2022-08-17 DIAGNOSIS — K2100 Gastro-esophageal reflux disease with esophagitis, without bleeding: Secondary | ICD-10-CM | POA: Diagnosis not present

## 2022-08-17 DIAGNOSIS — J9601 Acute respiratory failure with hypoxia: Secondary | ICD-10-CM | POA: Diagnosis not present

## 2022-08-17 DIAGNOSIS — I482 Chronic atrial fibrillation, unspecified: Secondary | ICD-10-CM | POA: Diagnosis not present

## 2022-08-17 DIAGNOSIS — J690 Pneumonitis due to inhalation of food and vomit: Secondary | ICD-10-CM | POA: Diagnosis not present

## 2022-08-17 LAB — BASIC METABOLIC PANEL
Anion gap: 6 (ref 5–15)
BUN: 14 mg/dL (ref 8–23)
CO2: 25 mmol/L (ref 22–32)
Calcium: 8.6 mg/dL — ABNORMAL LOW (ref 8.9–10.3)
Chloride: 115 mmol/L — ABNORMAL HIGH (ref 98–111)
Creatinine, Ser: 0.76 mg/dL (ref 0.44–1.00)
GFR, Estimated: >60 mL/min (ref >60)
Glucose, Bld: 125 mg/dL — ABNORMAL HIGH (ref 70–99)
Potassium: 4 mmol/L (ref 3.5–5.1)
Sodium: 146 mmol/L — ABNORMAL HIGH (ref 135–145)

## 2022-08-17 LAB — CBC
HCT: 28.4 % — ABNORMAL LOW (ref 36.0–46.0)
Hemoglobin: 8.9 g/dL — ABNORMAL LOW (ref 12.0–15.0)
MCH: 26.3 pg (ref 26.0–34.0)
MCHC: 31.3 g/dL (ref 30.0–36.0)
MCV: 84 fL (ref 80.0–100.0)
Platelets: 255 10*3/uL (ref 150–400)
RBC: 3.38 MIL/uL — ABNORMAL LOW (ref 3.87–5.11)
RDW: 15.7 % — ABNORMAL HIGH (ref 11.5–15.5)
WBC: 8 10*3/uL (ref 4.0–10.5)
nRBC: 0 % (ref 0.0–0.2)

## 2022-08-17 NOTE — Progress Notes (Signed)
Progress Note   Patient: Tara [name] CMK:[id number] DOB: 02-11-1946 DOA: 08/13/2022     4 DOS: the patient was seen and examined on 08/17/2022   Brief hospital course: As per H&P written by Dr.Zierle-Ghosh on 08/14/22 Tara [name] is a 76 y.o. female with medical history significant of anxiety, depression, GERD, history of meningioma, history of breast cancer, hyperlipidemia, hypertension, nonischemic myopathy, systolic CHF, and more presents to the ED with a chief complaint of possibly having a seizure.  Patient reports that it is her husband at bedside.  He provides most of the history.  He reports that patient is on continuous PEG feeds.  Every time he gets her medicine and free water flush she goes into a coughing fit.  He is not yet had home nursing out to show him how to check residuals.  She gets free water flushes twice daily when she takes her medicine.  He reports today when she had her free water flushes started coughing so bad that she began shaking and biting her lip.  She had slurred speech during this episode.  He reports it looked like a seizure to him.  He reports that patient is coughing and spitting up constantly since that time.  When she had an episode of nausea and vomiting it was nonbloody, undigested food.  She has had some pink-tinged sputum when she is coughing.  Patient takes nothing by mouth.  She is not on oxygen at home.  She denies any chest pain or palpitations.  She admits to dyspnea.  Husband reports that patient was just discharged from the hospital 2 days ago.  Possibly that was sent in another system because I do not see an chart review.  He reports even in the hospital every time she had a free water flush she was coughing and spitting up like that.  Any fevers.  No further complaints at this time.   Assessment and Plan: * Acute respiratory failure with hypoxia ([place]) - Likely secondary to aspiration pneumonia versus aspiration pneumonitis - Patient was able to  be weaned off oxygen supplementation today; good saturation on room air appreciated (94%). -Continue experiencing intermittent episodes of coughing spells. -Patient reports no chest pain. -Restarting PEG tube nutrition infusion and assessing tolerance. -Continue current antibiotics; with intention to transition to oral route on 08/18/2022 at time of discharge. -Continue as needed bronchodilators and wean off oxygen supplementation as tolerated.   Chronic systolic CHF (congestive heart failure) (HCC) - Last echo was in July which showed an EF of 40-45% with global hypokinesis of the left ventricle determinate diastolic function - BNP 54 -No crackles on exam. -Continue to follow daily weights/strict I's and O's. -Condition is currently compensated.  Anxiety and depression -Continue Zoloft, and as needed Klonopin -overall stable mood appreciated.  GERD -Continue PPI  Atrial fibrillation, chronic (HCC) -Continue amiodarone and Eliquis -Rate controlled.  Aspiration pneumonia ([place]) - With constant coughing following free water flushes and PEG tube nutrition infusion. -Follow recommendations by dietitian regarding rate and velocity; assess for residual and tolerance. -Patient with underlying gastroesophageal dysmotility and large hiatal hernia. -Prognosis overall guarded. -High risk for decompensation and continue with issues aspiration cycle. -remains NPO. -Continue treatment with IV Unasyn.  History of seizure disorder following craniotomy -Continue the use of current antiepileptic medications -No seizure activity appreciated.  Subjective:  No fever, no chest pain, no nausea or vomiting; Desaturation screening able to tolerate good saturation on room air.  So far with no events while  administering tube feedings at current rate.  Continue to have intermittent coughing spells but expressed no shortness of breath at this moment.  Physical Exam: Vitals:   08/17/22 0825 08/17/22  0928 08/17/22 1424 08/17/22 1554  BP:  (!) 103/54 (!) 95/49   Pulse:  (!) 55 70   Resp:   17   Temp:   98.4 F (36.9 C)   TempSrc:   Oral   SpO2: 98%  97% 94%  Weight:      Height:       General exam: Alert, awake, oriented x 3; very weak and deconditioned; reports intermittent coughing spells but having improvement in her breathing and currently not requiring oxygen supplementation. Respiratory system: Positive rhonchi; no wheezing, no crackles.  No using accessory muscles. Cardiovascular system: Rate controlled. No rubs or gallops; no JVD. Gastrointestinal system: Abdomen is nondistended, soft and nontender. No organomegaly or masses felt. Normal bowel sounds heard.  PEG tube in place Central nervous system: Alert and oriented. No new focal neurological deficits. Extremities: No cyanosis or clubbing; trace edema appreciated bilaterally. Skin: No petechiae. Psychiatry: Mood and affect appropriate.   Data Reviewed: No new data today.  Family Communication: No family at bedside on today's evaluation.  Disposition: Status is: Inpatient Remains inpatient appropriate because: Continue IV antibiotics for treatment of aspiration pneumonia/pneumonitis.   Planned Discharge Destination: Home with Home Health  Time spent: 35 minutes  Author: Barton Dubois, MD 08/17/2022 4:34 PM  For on call review [email or web address].

## 2022-08-18 ENCOUNTER — Telehealth: Payer: Self-pay | Admitting: Family Medicine

## 2022-08-18 DIAGNOSIS — I482 Chronic atrial fibrillation, unspecified: Secondary | ICD-10-CM | POA: Diagnosis not present

## 2022-08-18 DIAGNOSIS — J9601 Acute respiratory failure with hypoxia: Secondary | ICD-10-CM | POA: Diagnosis not present

## 2022-08-18 DIAGNOSIS — K2100 Gastro-esophageal reflux disease with esophagitis, without bleeding: Secondary | ICD-10-CM | POA: Diagnosis not present

## 2022-08-18 DIAGNOSIS — J690 Pneumonitis due to inhalation of food and vomit: Secondary | ICD-10-CM | POA: Diagnosis not present

## 2022-08-18 MED ORDER — AMOXICILLIN-POT CLAVULANATE 400-57 MG/5ML PO SUSR
875.0000 mg | Freq: Two times a day (BID) | ORAL | Status: DC
  Filled 2022-08-18 (×5): qty 10.9

## 2022-08-18 MED ORDER — AMOXICILLIN-POT CLAVULANATE 250-62.5 MG/5ML PO SUSR: 500.0000 mg | Freq: Three times a day (TID) | ORAL | 0 refills | Status: AC

## 2022-08-18 MED ORDER — PROSOURCE TF20 ENFIT COMPATIBL EN LIQD: 60.0000 mL | Freq: Every day | ENTERAL | 0 refills | Status: DC

## 2022-08-18 MED ORDER — AMOXICILLIN-POT CLAVULANATE 250-62.5 MG/5ML PO SUSR
500.0000 mg | Freq: Three times a day (TID) | ORAL | Status: DC
  Administered 2022-08-18 – 2022-08-19 (×3): 500 mg via ORAL
  Filled 2022-08-18 (×7): qty 10

## 2022-08-18 MED ORDER — FREE WATER: 100.0000 mL | Freq: Two times a day (BID) | Status: DC

## 2022-08-18 MED ORDER — JEVITY 1.5 CAL/FIBER PO LIQD: 1000.0000 mL | ORAL | 1 refills | Status: DC

## 2022-08-18 NOTE — Telephone Encounter (Signed)
Patient Tara [name] Spouse came by the office to cancel appointment, patient is in the hospital

## 2022-08-18 NOTE — Progress Notes (Signed)
Progress Note   Patient: Tara [name] DOB: 1946-04-11 DOA: 08/13/2022     5 DOS: the patient was seen and examined on 08/18/2022   Brief hospital course: As per H&P written by Dr.Zierle-Ghosh on 08/14/22 Tara [name] is a 76 y.o. female with medical history significant of anxiety, depression, GERD, history of meningioma, history of breast cancer, hyperlipidemia, hypertension, nonischemic myopathy, systolic CHF, and more presents to the ED with a chief complaint of possibly having a seizure.  Patient reports that it is her husband at bedside.  He provides most of the history.  He reports that patient is on continuous PEG feeds.  Every time he gets her medicine and free water flush she goes into a coughing fit.  He is not yet had home nursing out to show him how to check residuals.  She gets free water flushes twice daily when she takes her medicine.  He reports today when she had her free water flushes started coughing so bad that she began shaking and biting her lip.  She had slurred speech during this episode.  He reports it looked like a seizure to him.  He reports that patient is coughing and spitting up constantly since that time.  When she had an episode of nausea and vomiting it was nonbloody, undigested food.  She has had some pink-tinged sputum when she is coughing.  Patient takes nothing by mouth.  She is not on oxygen at home.  She denies any chest pain or palpitations.  She admits to dyspnea.  Husband reports that patient was just discharged from the hospital 2 days ago.  Possibly that was sent in another system because I do not see an chart review.  He reports even in the hospital every time she had a free water flush she was coughing and spitting up like that.  Any fevers.  No further complaints at this time.   Assessment and Plan: * Acute respiratory failure with hypoxia ([place]) - Likely secondary to aspiration pneumonia versus aspiration pneumonitis - Patient was able to  be weaned off oxygen supplementation today; good saturation on room air appreciated (94%). -Continue experiencing intermittent episodes of coughing spells. -Patient reports no chest pain. -Restarting PEG tube nutrition infusion and assessing tolerance. -Continue current antibiotics; with intention to transition to oral route on 08/18/2022 at time of discharge. -Continue as needed bronchodilators and wean off oxygen supplementation as tolerated.   Chronic systolic CHF (congestive heart failure) (HCC) - Last echo was in July which showed an EF of 40-45% with global hypokinesis of the left ventricle determinate diastolic function - BNP 54 -No crackles on exam. -Continue to follow daily weights/strict I's and O's. -Condition is currently compensated.  Anxiety and depression -Continue Zoloft, and as needed Klonopin -overall stable mood appreciated.  GERD -Continue PPI  Atrial fibrillation, chronic (HCC) -Continue amiodarone and Eliquis -Rate controlled.  Aspiration pneumonia ([place]) - With constant coughing following free water flushes and PEG tube nutrition infusion. -Follow recommendations by dietitian regarding rate and velocity; assess for residual and tolerance. -Patient with underlying gastroesophageal dysmotility and large hiatal hernia. -Prognosis overall guarded. -High risk for decompensation and continue with issues aspiration cycle. -remains NPO. -Continue treatment with IV Unasyn.  History of seizure disorder following craniotomy -Continue the use of current antiepileptic medications -No seizure activity appreciated.  Hypernatremia -will add free water -follow electrolytes.   Subjective:  No fever, no chest pain, no nausea, no vomiting.  Patient is off oxygen with good saturation.  Tolerating peg tube infusion.  Physical Exam: Vitals:   08/17/22 1554 08/17/22 2141 08/18/22 0518 08/18/22 1435  BP:  118/64 127/62 126/61  Pulse:  63 76 76  Resp:  '20 18 18  '$ Temp:   99.6 F (37.6 C) (!) 97.2 F (36.2 C) 98.6 F (37 C)  TempSrc:    Oral  SpO2: 94% 97% 99% 98%  Weight:      Height:       General exam: Alert, awake, oriented x 3; weak and deconditioned; no CP, no nausea, no vomiting. Still with coughing spells and demonstrating not need of oxygen supplementation currently. Respiratory system: positive rhonchi bilaterally; no wheezing. No using accessory muscles. Cardiovascular system:Rate controlled. No rubs, no gallops. Gastrointestinal system: Abdomen is nondistended, soft and nontender. No organomegaly or masses felt. Normal bowel sounds heard. Peg in position. Central nervous system: Alert and oriented. No focal neurological deficits. Extremities: No cyanosis, no clubbing, trace edema bilaterally. Skin: No petechiae. Psychiatry: mood and affect appropriate.  Data Reviewed: Basic metabolic panel: Sodium 258, potassium 4.0, chloride 115, bicarb 25, BUN 14, creatinine 0.76 CBC: WBCs 8.0, hemoglobin 8.9 and platelet count 255 K.  Family Communication: Patient husband over the phone.  Disposition: Status is: Inpatient Remains inpatient appropriate because: Transition antibiotics to oral route; patient husband requested discharge in the morning to arrange home health services and around-the-clock care/supervision.   Planned Discharge Destination: Home with Home Health  Time spent: 35 minutes  Author: Barton Dubois, MD 08/18/2022 3:41 PM  For on call review [email or web address].

## 2022-08-18 NOTE — Care Management Important Message (Signed)
Important Message  Patient Details  Name: Tara [name] MRN: [id number] Date of Birth: 1946-09-16   Medicare Important Message Given:  Yes     Tommy Medal 08/18/2022, 11:59 AM

## 2022-08-18 NOTE — Telephone Encounter (Signed)
noted 

## 2022-08-19 ENCOUNTER — Inpatient Hospital Stay: Payer: Medicare Other | Admitting: Internal Medicine

## 2022-08-19 ENCOUNTER — Inpatient Hospital Stay: Payer: Medicare Other | Admitting: Family Medicine

## 2022-08-19 DIAGNOSIS — I5022 Chronic systolic (congestive) heart failure: Secondary | ICD-10-CM | POA: Diagnosis not present

## 2022-08-19 DIAGNOSIS — J690 Pneumonitis due to inhalation of food and vomit: Secondary | ICD-10-CM | POA: Diagnosis not present

## 2022-08-19 DIAGNOSIS — J9601 Acute respiratory failure with hypoxia: Secondary | ICD-10-CM | POA: Diagnosis not present

## 2022-08-19 DIAGNOSIS — F419 Anxiety disorder, unspecified: Secondary | ICD-10-CM | POA: Diagnosis not present

## 2022-08-19 NOTE — Progress Notes (Signed)
Patient discharged with instructions given on medications and follow up visits,patient and family verbalized understanding. Prescriptions sent to Pharmacy of choice documented on AVS.No IV access noted. Accompanied by staff to an awaiting vehicle.

## 2022-08-19 NOTE — Progress Notes (Signed)
This nurse noticed pts left forearm was swollen where her iv was placed. IV site was wrapped in Kerlix. After unwrapping, noticed that the IV had infiltrated. Notified night shift DO if this nurse needed to place another IV. Pt is suppose to d/c today with HH.

## 2022-08-19 NOTE — Progress Notes (Addendum)
Adefeso said to hold off on getting another IV right now. Currently have pts arm elevated with a pillow d/t swelling.

## 2022-08-19 NOTE — Discharge Summary (Signed)
Physician Discharge Summary   Patient: Tara [name] MRN: [id number] DOB: 06/13/1946  Admit date:     08/13/2022  Discharge date: 08/19/22  Discharge Physician: Barton Dubois   PCP: [name], MD   Recommendations at discharge:  Will recommend outpatient goals of care discussion and advance directives clarifications. Repeat chest x-ray in 4-6 weeks to assure complete resolution of infiltrates. Repeat basic metabolic panel to follow ultralights and renal function   Discharge Diagnoses: Principal Problem:   Acute respiratory failure with hypoxia ([place]) Active Problems:   Chronic systolic CHF (congestive heart failure) (HCC)   Anxiety and depression   GERD   Atrial fibrillation, chronic (HCC)   Aspiration pneumonia ([place])  Resolved Problems:   * No resolved hospital problems. [facility] admission course:  As per H&P written by Dr.Zierle-Ghosh on 08/14/22 JANETTA [name] is a 76 y.o. female with medical history significant of anxiety, depression, GERD, history of meningioma, history of breast cancer, hyperlipidemia, hypertension, nonischemic myopathy, systolic CHF, and more presents to the ED with a chief complaint of possibly having a seizure.  Patient reports that it is her husband at bedside.  He provides most of the history.  He reports that patient is on continuous PEG feeds.  Every time he gets her medicine and free water flush she goes into a coughing fit.  He is not yet had home nursing out to show him how to check residuals.  She gets free water flushes twice daily when she takes her medicine.  He reports today when she had her free water flushes started coughing so bad that she began shaking and biting her lip.  She had slurred speech during this episode.  He reports it looked like a seizure to him.  He reports that patient is coughing and spitting up constantly since that time.  When she had an episode of nausea and vomiting it was nonbloody, undigested food.  She  has had some pink-tinged sputum when she is coughing.  Patient takes nothing by mouth.  She is not on oxygen at home.  She denies any chest pain or palpitations.  She admits to dyspnea.  Husband reports that patient was just discharged from the hospital 2 days ago.  Possibly that was sent in another system because I do not see an chart review.  He reports even in the hospital every time she had a free water flush she was coughing and spitting up like that.  Any fevers.  No further complaints at this time.     Assessment and Plan: * Acute respiratory failure with hypoxia ([place]) - Likely secondary to aspiration pneumonia versus aspiration pneumonitis - Patient was able to be weaned off oxygen supplementation today; good saturation on room air appreciated (94%). -Continue experiencing intermittent episodes of coughing spells. -Patient reports no chest pain. -Restarting PEG tube nutrition infusion and assessing tolerance. -Continue current antibiotics; with intention to transition to oral route on 08/19/2022 at time of discharge. -Continue as needed bronchodilators and wean off oxygen supplementation as tolerated.   Chronic systolic CHF (congestive heart failure) (HCC) - Last echo was in July which showed an EF of 40-45% with global hypokinesis of the left ventricle determinate diastolic function - BNP 54 -No crackles on exam. -Continue to follow daily weights/strict I's and O's. -Condition is currently compensated.   Anxiety and depression -Continue Zoloft, and as needed Klonopin -overall stable mood appreciated.   GERD -Continue PPI   Atrial fibrillation, chronic (HCC) -Continue amiodarone  and Eliquis -Rate controlled and is stable.   Aspiration pneumonia ([place]) - With constant coughing following free water flushes and PEG tube nutrition infusion. -Follow recommendations by dietitian regarding rate and velocity; assess for residual and tolerance. -Patient with underlying  gastroesophageal dysmotility and large hiatal hernia. -Prognosis overall guarded. -High risk for future decompensation and continue recurrent issues aspiration process. -Patient will remain NPO. -Continue treatment with Augmentin at time of discharge to complete antibiotic therapy for aspiration pneumonia.   History of seizure disorder following craniotomy -Continue the use of current antiepileptic medications -No seizure activity appreciated.   Hypernatremia -will add free water -follow electrolytes with repeat basic metabolic panel at follow-up visit.   Consultants: None Procedures performed: See below for x-ray reports Disposition: Home with home health services. Diet recommendation: N.p.o.; PEG tube nutritional infusions.  DISCHARGE MEDICATION: Allergies as of 08/19/2022       Reactions   Aspirin Other (See Comments)   Reports GI bleed--pt is currently taking   Lisinopril Cough   Sudafed [pseudoephedrine Hcl]    Pt reports she had drainage in throat that made her throat hurt.         Medication List     STOP taking these medications    dexamethasone 0.5 MG tablet Commonly known as: DECADRON   Jardiance 10 MG Tabs tablet Generic drug: empagliflozin       TAKE these medications    acetaminophen 500 MG tablet Commonly known as: TYLENOL Take 500-1,000 mg by mouth every 6 (six) hours as needed (FOR PAIN.).   amiodarone 200 MG tablet Commonly known as: PACERONE Take 1 tablet (200 mg total) by mouth daily.   amoxicillin-clavulanate 250-62.5 MG/5ML suspension Commonly known as: AUGMENTIN Take 10 mLs (500 mg total) by mouth every 8 (eight) hours for 4 days.   apixaban 5 MG Tabs tablet Commonly known as: ELIQUIS Take 1 tablet (5 mg total) by mouth 2 (two) times daily.   clonazePAM 0.5 MG tablet Commonly known as: KLONOPIN Take 1 tablet (0.5 mg total) by mouth at bedtime.   docusate sodium 100 MG capsule Commonly known as: COLACE Take 200 mg by mouth 2  (two) times daily as needed for mild constipation.   feeding supplement (JEVITY 1.5 CAL/FIBER) Liqd Place 1,000 mLs into feeding tube daily.   feeding supplement (PROSource TF20) liquid Place 60 mLs into feeding tube daily.   free water Soln Place 100 mLs into feeding tube every 12 (twelve) hours. (Slowly)   ipratropium-albuterol 0.5-2.5 (3) MG/3ML Soln Commonly known as: DUONEB USe 1 vial by nebulization every 6 (six) hours as needed for shortness of breath.   levETIRAcetam 100 MG/ML solution Commonly known as: KEPPRA Place 7.5 mLs into feeding tube 2 (two) times daily. What changed: Another medication with the same name was removed. Continue taking this medication, and follow the directions you see here.   montelukast 10 MG tablet Commonly known as: SINGULAIR TAKE ONE TABLET BY MOUTH AT BEDTIME.   pantoprazole 40 MG tablet Commonly known as: PROTONIX TAKE (1) TABLET BY MOUTH TWICE DAILY. What changed: See the new instructions.   rosuvastatin 20 MG tablet Commonly known as: Crestor Take 1 tablet (20 mg total) by mouth daily.   sertraline 25 MG tablet Commonly known as: ZOLOFT Take 1 tablet (25 mg total) by mouth daily.   Vimpat 10 MG/ML oral solution Generic drug: lacosamide Take 200 mg by mouth 2 (two) times daily. What changed: Another medication with the same name was removed. Continue taking this  medication, and follow the directions you see here.   Vitamin D 50 MCG (2000 UT) Caps Take 2,000 Units by mouth daily.        Follow-up Information     [name], MD. Schedule an appointment as soon as possible for a visit in 1 week(s).   Specialty: Family Medicine Contact information: [place] [address] [phone]                Discharge Exam: Danley Danker Weights   08/13/22 2132 08/17/22 0651  Weight: 74.7 kg 71.6 kg   General exam: Alert, awake, oriented x 3; weak and deconditioned; no CP, no nausea, no vomiting.  Still with coughing spells and demonstrating not need of oxygen supplementation currently. Respiratory system: positive rhonchi bilaterally; no wheezing. No using accessory muscles. Cardiovascular system:Rate controlled. No rubs, no gallops. Gastrointestinal system: Abdomen is nondistended, soft and nontender. No organomegaly or masses felt. Normal bowel sounds heard. Peg in position. Central nervous system: Alert and oriented. No focal neurological deficits. Extremities: No cyanosis, no clubbing, trace edema bilaterally. Skin: No petechiae. Psychiatry: mood and affect appropriate.  Condition at discharge: Stable and improved.  The results of significant diagnostics from this hospitalization (including imaging, microbiology, ancillary and laboratory) are listed below for reference.   Imaging Studies: DG Chest Port 1 View  Result Date: 08/13/2022 CLINICAL DATA:  Hypoxia EXAM: PORTABLE CHEST 1 VIEW COMPARISON:  Chest x-ray 05/28/2022 FINDINGS: The heart size and mediastinal contours are within normal limits. Calcified right hilar lymph nodes are again seen. Both lungs are clear. The visualized skeletal structures are unremarkable. Right axillary surgical clips are again seen. IMPRESSION: No active disease. Old granulomatous disease. Electronically Signed   By: Ronney Asters M.D.   On: 08/13/2022 22:41    Microbiology: Results for orders placed or performed during the hospital encounter of 08/13/22  Resp Panel by RT-PCR (Flu A&B, Covid) Anterior Nasal Swab     Status: None   Collection Time: 08/13/22 11:00 PM   Specimen: Anterior Nasal Swab  Result Value Ref Range Status   SARS Coronavirus 2 by RT PCR NEGATIVE NEGATIVE Final    Comment: (NOTE) SARS-CoV-2 target nucleic acids are NOT DETECTED.  The SARS-CoV-2 RNA is generally detectable in upper respiratory specimens during the acute phase of infection. The lowest concentration of SARS-CoV-2 viral copies this assay can detect is 138  copies/mL. A negative result does not preclude SARS-Cov-2 infection and should not be used as the sole basis for treatment or other patient management decisions. A negative result may occur with  improper specimen collection/handling, submission of specimen other than nasopharyngeal swab, presence of viral mutation(s) within the areas targeted by this assay, and inadequate number of viral copies(<138 copies/mL). A negative result must be combined with clinical observations, patient history, and epidemiological information. The expected result is Negative.  Fact Sheet for Patients:  EntrepreneurPulse.com.au  Fact Sheet for Healthcare Providers:  IncredibleEmployment.be  This test is no t yet approved or cleared by the [place] FDA and  has been authorized for detection and/or diagnosis of SARS-CoV-2 by FDA under an Emergency Use Authorization (EUA). This EUA will remain  in effect (meaning this test can be used) for the duration of the COVID-19 declaration under Section 564(b)(1) of the Act, 21 U.S.C.section 360bbb-3(b)(1), unless the authorization is terminated  or revoked sooner.       Influenza A by PCR NEGATIVE NEGATIVE Final   Influenza B by PCR NEGATIVE NEGATIVE  Final    Comment: (NOTE) The Xpert Xpress SARS-CoV-2/FLU/RSV plus assay is intended as an aid in the diagnosis of influenza from Nasopharyngeal swab specimens and should not be used as a sole basis for treatment. Nasal washings and aspirates are unacceptable for Xpert Xpress SARS-CoV-2/FLU/RSV testing.  Fact Sheet for Patients: EntrepreneurPulse.com.au  Fact Sheet for Healthcare Providers: IncredibleEmployment.be  This test is not yet approved or cleared by the [place] FDA and has been authorized for detection and/or diagnosis of SARS-CoV-2 by FDA under an Emergency Use Authorization (EUA). This EUA will remain in effect (meaning  this test can be used) for the duration of the COVID-19 declaration under Section 564(b)(1) of the Act, 21 U.S.C. section 360bbb-3(b)(1), unless the authorization is terminated or revoked.  Performed at [facility], [address] [place], [place] [address]   MRSA Next Gen by PCR, Nasal     Status: None   Collection Time: 08/13/22 11:00 PM   Specimen: Nasal Mucosa; Nasal Swab  Result Value Ref Range Status   MRSA by PCR Next Gen NOT DETECTED NOT DETECTED Final    Comment: (NOTE) The GeneXpert MRSA Assay (FDA approved for NASAL specimens only), is one component of a comprehensive MRSA colonization surveillance program. It is not intended to diagnose MRSA infection nor to guide or monitor treatment for MRSA infections. Test performance is not FDA approved in patients less than 17 years old. Performed at [facility], [address], [place], [place] [address]     Labs: CBC: Recent Labs  Lab 08/13/22 2239 08/14/22 0545 08/16/22 0451 08/17/22 0647  WBC 10.9* 12.4* 7.4 8.0  NEUTROABS 9.0* 10.3*  --   --   HGB 10.5* 9.7* 8.5* 8.9*  HCT 32.5* 30.2* 26.7* 28.4*  MCV 79.9* 80.5 82.9 84.0  PLT 278 273 227 300   Basic Metabolic Panel: Recent Labs  Lab 08/13/22 2239 08/14/22 0545 08/16/22 0451 08/17/22 0647  NA 132* 131* 142 146*  K 4.0 4.1 3.9 4.0  CL 98 101 113* 115*  CO2 '22 22 22 25  '$ GLUCOSE 111* 105* 124* 125*  BUN 27* 26* 15 14  CREATININE 1.00 0.96 0.90 0.76  CALCIUM 9.5 9.0 8.4* 8.6*  MG  --  1.8  --   --    Liver Function Tests: Recent Labs  Lab 08/14/22 0545  AST 25  ALT 27  ALKPHOS 80  BILITOT 0.6  PROT 7.3  ALBUMIN 3.1*   CBG: Recent Labs  Lab 08/13/22 2254  GLUCAP 111*    Discharge time spent: greater than 30 minutes.  Signed: Barton Dubois, MD [facility] 08/19/2022

## 2022-08-19 NOTE — Telephone Encounter (Signed)
CAP forms  Copied Noted Sleeved

## 2022-08-19 NOTE — TOC Transition Note (Signed)
Transition of Care [facility]) - CM/SW Discharge Note   Patient Details  Name: Tara [name] MRN: [id number] Date of Birth: 10/13/1945  Transition of Care [facility]) CM/SW Contact:  Tara [name], [place] Phone Number: 08/19/2022, 11:09 AM   Clinical Narrative:    CSW updated overnight that family is interested in [facility] at D/C and requested CSW spoke with pts daughter. CSW spoke with pts daughter Tara [name] about interest in [facility]. Pt and family are interested in this at D/C. Tara [name] states that she has reached out to [place] already. CSW reached out to [facility] with Tara [name] to give referral. [facility] PT/RN/SW/Aide orders have been placed. CSW awaiting confirmation if Tara [name] can accept. TOC signing off.   Final next level of care: [place] Barriers to Discharge: Barriers Resolved   Patient Goals and CMS Choice Patient states their goals for this hospitalization and ongoing recovery are:: home with [facility] CMS Medicare.gov Compare Post Acute Care list provided to:: Patient Represenative (must comment) Choice offered to / list presented to : Adult Children  Discharge Placement                       Discharge Plan and Services                          HH Arranged: RN, PT, Nurse's Aide, Social Work [facility] Agency: [place] Date [facility] Agency Contacted: 08/19/22   Representative spoke with at [place]: [place] Interventions     Readmission Risk Interventions    03/06/2022   12:36 PM  Readmission Risk Prevention Plan  Transportation Screening Complete  HRI or [place] Complete  Social Work Consult for [place] Planning/Counseling Complete

## 2022-08-20 ENCOUNTER — Telehealth: Payer: Self-pay | Admitting: Family Medicine

## 2022-08-20 ENCOUNTER — Ambulatory Visit (INDEPENDENT_AMBULATORY_CARE_PROVIDER_SITE_OTHER): Payer: Medicare Other | Admitting: Family Medicine

## 2022-08-20 ENCOUNTER — Encounter: Payer: Self-pay | Admitting: Family Medicine

## 2022-08-20 VITALS — BP 126/71 | HR 79 | Resp 16 | Ht <= 58 in | Wt 158.4 lb

## 2022-08-20 DIAGNOSIS — N183 Chronic kidney disease, stage 3 unspecified: Secondary | ICD-10-CM

## 2022-08-20 DIAGNOSIS — J9601 Acute respiratory failure with hypoxia: Secondary | ICD-10-CM | POA: Diagnosis not present

## 2022-08-20 DIAGNOSIS — J690 Pneumonitis due to inhalation of food and vomit: Secondary | ICD-10-CM

## 2022-08-20 DIAGNOSIS — Z7689 Persons encountering health services in other specified circumstances: Secondary | ICD-10-CM | POA: Diagnosis not present

## 2022-08-20 DIAGNOSIS — H6123 Impacted cerumen, bilateral: Secondary | ICD-10-CM

## 2022-08-20 DIAGNOSIS — R569 Unspecified convulsions: Secondary | ICD-10-CM

## 2022-08-20 DIAGNOSIS — F419 Anxiety disorder, unspecified: Secondary | ICD-10-CM

## 2022-08-20 DIAGNOSIS — F32A Depression, unspecified: Secondary | ICD-10-CM

## 2022-08-20 DIAGNOSIS — Z931 Gastrostomy status: Secondary | ICD-10-CM

## 2022-08-20 MED ORDER — CLONAZEPAM 0.5 MG PO TABS: 0.5000 mg | ORAL_TABLET | Freq: Every day | ORAL | 5 refills | Status: DC

## 2022-08-20 MED ORDER — PANTOPRAZOLE SODIUM 40 MG PO TBEC: DELAYED_RELEASE_TABLET | ORAL | 3 refills | Status: DC

## 2022-08-20 MED ORDER — PROSOURCE TF20 ENFIT COMPATIBL EN LIQD: 60.0000 mL | Freq: Every day | ENTERAL | 3 refills | Status: DC

## 2022-08-20 MED ORDER — MONTELUKAST SODIUM 10 MG PO TABS: 10.0000 mg | ORAL_TABLET | Freq: Every day | ORAL | 3 refills | Status: DC

## 2022-08-20 MED ORDER — ROSUVASTATIN CALCIUM 20 MG PO TABS: 20.0000 mg | ORAL_TABLET | Freq: Every day | ORAL | 5 refills | Status: DC

## 2022-08-20 NOTE — Patient Instructions (Addendum)
F/U in 2 months, call if you need me sooner  Nurse pls order pill packing of meds and refill starred meds  You are referred to [facility] Neurology re seizures  You are referred to GI in [facility]  CAP service to be arranged  Nurse visits once weekly x 6 weeks  Chem 7 and EGFR  in 1 week, home, health nurse to do that  Home health PT/OT twice weekly. Eval by speech therapy in home   Ricki Rodriguez to be aDDED ON DPR her daughter   Thanks for choosing [place], we consider it a privelige to serve you.

## 2022-08-20 NOTE — Telephone Encounter (Signed)
Transition Care Management Follow-up Telephone Call Date of discharge and from where: 08/19/22 [facility] How have you been since you were released from the hospital? Pretty good  Any questions or concerns? No  Items Reviewed: Did the pt receive and understand the discharge instructions provided? Yes  Medications obtained and verified? Yes  missing some Other? No  Any new allergies since your discharge? No  Dietary orders reviewed? Yes Do you have support at home? Yes  husband  Home Care and Equipment/Supplies: Were home health services ordered? yes If so, what is the name of the agency? Coming tomorrow  Has the agency set up a time to come to the patient's home? yes Were any new equipment or medical supplies ordered?  Yes:   What is the name of the medical supply agency? [employer], received  Were you able to get the supplies/equipment? yes Do you have any questions related to the use of the equipment or supplies? No  Functional Questionnaire: (I = Independent and D = Dependent) ADLs: d  Bathing/Dressing- d  Meal Prep- d  Eating- i  Maintaining continence- d with help  Transferring/Ambulation- d  Managing Meds- d  Follow up appointments reviewed:  PCP Hospital f/u appt confirmed? Yes  Scheduled to see [facility]  on 11/29 @ 1100. [facility] f/u appt confirmed? No   Are transportation arrangements needed? No  If their condition worsens, is the pt aware to call PCP or go to the Emergency Dept.? Yes Was the patient provided with contact information for the PCP's office or ED? Yes Was to pt encouraged to call back with questions or concerns? Yes

## 2022-08-21 ENCOUNTER — Telehealth: Payer: Self-pay | Admitting: Family Medicine

## 2022-08-21 DIAGNOSIS — G40909 Epilepsy, unspecified, not intractable, without status epilepticus: Secondary | ICD-10-CM | POA: Diagnosis not present

## 2022-08-21 DIAGNOSIS — Z7901 Long term (current) use of anticoagulants: Secondary | ICD-10-CM | POA: Diagnosis not present

## 2022-08-21 DIAGNOSIS — I255 Ischemic cardiomyopathy: Secondary | ICD-10-CM | POA: Diagnosis not present

## 2022-08-21 DIAGNOSIS — I482 Chronic atrial fibrillation, unspecified: Secondary | ICD-10-CM | POA: Diagnosis not present

## 2022-08-21 DIAGNOSIS — D649 Anemia, unspecified: Secondary | ICD-10-CM | POA: Diagnosis not present

## 2022-08-21 DIAGNOSIS — K449 Diaphragmatic hernia without obstruction or gangrene: Secondary | ICD-10-CM | POA: Diagnosis not present

## 2022-08-21 DIAGNOSIS — R1319 Other dysphagia: Secondary | ICD-10-CM | POA: Diagnosis not present

## 2022-08-21 DIAGNOSIS — Z9181 History of falling: Secondary | ICD-10-CM | POA: Diagnosis not present

## 2022-08-21 DIAGNOSIS — Z431 Encounter for attention to gastrostomy: Secondary | ICD-10-CM | POA: Diagnosis not present

## 2022-08-21 DIAGNOSIS — I110 Hypertensive heart disease with heart failure: Secondary | ICD-10-CM | POA: Diagnosis not present

## 2022-08-21 DIAGNOSIS — I5022 Chronic systolic (congestive) heart failure: Secondary | ICD-10-CM | POA: Diagnosis not present

## 2022-08-21 DIAGNOSIS — Z792 Long term (current) use of antibiotics: Secondary | ICD-10-CM | POA: Diagnosis not present

## 2022-08-21 DIAGNOSIS — G7281 Critical illness myopathy: Secondary | ICD-10-CM | POA: Diagnosis not present

## 2022-08-21 DIAGNOSIS — Z6838 Body mass index (BMI) 38.0-38.9, adult: Secondary | ICD-10-CM | POA: Diagnosis not present

## 2022-08-21 DIAGNOSIS — J690 Pneumonitis due to inhalation of food and vomit: Secondary | ICD-10-CM | POA: Diagnosis not present

## 2022-08-21 DIAGNOSIS — Z9011 Acquired absence of right breast and nipple: Secondary | ICD-10-CM | POA: Diagnosis not present

## 2022-08-21 DIAGNOSIS — F419 Anxiety disorder, unspecified: Secondary | ICD-10-CM | POA: Diagnosis not present

## 2022-08-21 DIAGNOSIS — E669 Obesity, unspecified: Secondary | ICD-10-CM | POA: Diagnosis not present

## 2022-08-21 DIAGNOSIS — F32A Depression, unspecified: Secondary | ICD-10-CM | POA: Diagnosis not present

## 2022-08-21 DIAGNOSIS — E785 Hyperlipidemia, unspecified: Secondary | ICD-10-CM | POA: Diagnosis not present

## 2022-08-21 DIAGNOSIS — Z853 Personal history of malignant neoplasm of breast: Secondary | ICD-10-CM | POA: Diagnosis not present

## 2022-08-21 DIAGNOSIS — I428 Other cardiomyopathies: Secondary | ICD-10-CM | POA: Diagnosis not present

## 2022-08-21 DIAGNOSIS — K219 Gastro-esophageal reflux disease without esophagitis: Secondary | ICD-10-CM | POA: Diagnosis not present

## 2022-08-21 DIAGNOSIS — J9601 Acute respiratory failure with hypoxia: Secondary | ICD-10-CM | POA: Diagnosis not present

## 2022-08-21 DIAGNOSIS — Z86011 Personal history of benign neoplasm of the brain: Secondary | ICD-10-CM | POA: Diagnosis not present

## 2022-08-21 NOTE — Telephone Encounter (Signed)
Kenard Gower called in on patient. Has a few questions in regard for medication issues as well as Tube feeding.  Wants a call back to discuss  [phone]

## 2022-08-22 ENCOUNTER — Telehealth: Payer: Self-pay | Admitting: Family Medicine

## 2022-08-22 ENCOUNTER — Other Ambulatory Visit: Payer: Self-pay | Admitting: Family Medicine

## 2022-08-22 DIAGNOSIS — J9601 Acute respiratory failure with hypoxia: Secondary | ICD-10-CM

## 2022-08-22 MED ORDER — AMIODARONE HCL 200 MG PO TABS: 200.0000 mg | ORAL_TABLET | Freq: Every day | ORAL | 3 refills | Status: DC

## 2022-08-22 MED ORDER — PROSOURCE TF20 ENFIT COMPATIBL EN LIQD: 60.0000 mL | Freq: Every day | ENTERAL | 3 refills | Status: DC

## 2022-08-22 MED ORDER — JEVITY 1.5 CAL/FIBER PO LIQD: 1000.0000 mL | ORAL | 1 refills | Status: AC

## 2022-08-22 MED ORDER — IPRATROPIUM-ALBUTEROL 0.5-2.5 (3) MG/3ML IN SOLN: 3.0000 mL | Freq: Four times a day (QID) | RESPIRATORY_TRACT | 3 refills | Status: DC | PRN

## 2022-08-22 MED ORDER — AMIODARONE HCL 200 MG PO TABS: ORAL_TABLET | ORAL | 3 refills | Status: DC

## 2022-08-22 NOTE — Telephone Encounter (Signed)
Ben physical therapist w. [facility] Lemmings called in on patient behalf.   Pt has been dc from hospital w. Craniotomy for benign tumor   Then returned back due to respiratory issues / congestive heart failure complications   Wants to pick up frequency of   1 week 1  2 week 2  1 week 2   Call back # [phone]

## 2022-08-22 NOTE — Telephone Encounter (Signed)
Spoke with Tara [name] states  Two cal hn 2.0 cal is the tube feeding that she has in the home. On the pump 30 ml per hr but the water flush setting is 25 ml every hr. states that amiodrone 200 mg is in pill pack as 1/2 tablet daily instead of 1 whole tablet daily. She also states that pt does not have a nebulizer machine or duoneb in home. Tara [name] wants to know if provider wants to have a suckion in the home at least a yonker to suck up phlem. For nursing visits twice next week and then once a week for 4 weeks. Nurse is wanting to clarify what feeding supplement and what water flush is suppose to be on. please advise

## 2022-08-22 NOTE — Telephone Encounter (Signed)
Spoke with nurse. Feed called in and neb sent to [facility] and suction ordered through [employer]

## 2022-08-23 ENCOUNTER — Encounter: Payer: Self-pay | Admitting: Family Medicine

## 2022-08-23 DIAGNOSIS — H6123 Impacted cerumen, bilateral: Secondary | ICD-10-CM | POA: Insufficient documentation

## 2022-08-23 DIAGNOSIS — Z7689 Persons encountering health services in other specified circumstances: Secondary | ICD-10-CM | POA: Insufficient documentation

## 2022-08-23 NOTE — Assessment & Plan Note (Signed)
Successful ear flush of outer canals by Nursing,

## 2022-08-23 NOTE — Assessment & Plan Note (Signed)
Controlled on medication continue same

## 2022-08-23 NOTE — Progress Notes (Signed)
   Tara [name]     MRN: [id number]      DOB: 04/18/1946   HPI Tara [name] is here for [facility] visit , admitted from 11/22 to 08/19/2022 with acute respiratory failure with hypoxia due to aspiration pneumonia, having been discharged from the SNF, Tara [name]   just 2 days prior Of note she was admitted  from 6/08 to 03/13/2022 with acute renal failure and on 6/24 with seizures, Neurology evaluated her and concluded that her seizures were due to a meningioma which she had for years, over a decade,She had a craniotomy on 07/03, never had a tonic clonic seizure post procedure and improved neurologically and was discharged 06/10/2022 to SNF She is here brought by her husband and visit coordinated with telephone contact with her daughter Tara [name] on the phone Medications need to be sent to pharmacy.Referrals for GI follow up and also Neurology follow up need to be done Needs form signed off for CAP program and assistance Hospital course and medications reviewed ROS History also provided by spouse Denies recent fever or chills. C/o generalized weakness, but feels generally  C/o hearing loss and ears clogged with wax Chronic coughing spells intermittently associated with water flushes Denies  leg swelling Denies abdominal pain, nausea, vomiting,diarrhea or constipation.   Denies dysuria, Limited mobility, . . Denies uncontrolled  depression, anxiety  Denies skin break down or rash.   PE  BP 126/71   Pulse 79   Resp 16   Ht '4\' 9"'$  (1.448 m)   Wt 158 lb 6.4 oz (71.8 kg)   SpO2 97%   BMI 34.28 kg/m   Patient alert and oriented answering questions appropriately .In no c/p distress, sitting in wheelchair  HEENT: No facial asymmetry, EOMI,  bilateral cerumen impaction right worse than left     Chest: decreased though adequate air entry scattered crackles, no wheezes.  CVS: S1, S2 .Regular rate.  ABD: Soft non tender.   Ext: No edema  MS: decreased  ROM spine, shoulders, hips and  knees.  Skin: Intact, per spouse, Psych: Good eye contact, normal affect. not anxious or depressed appearing.  CNS: CN 2-12 intact.   Assessment & Plan  Aspiration pneumonia ([place]) Improved,will complete antibiotic course as out patient. Request for in ome antibiotics in th event of acute decompensation , not beneficial as she will need in hospital management    Acute respiratory failure with hypoxia (HCC) Resolved , home without supplemental oxygen  Chronic kidney disease (CKD), stage III (moderate) Rept labs in 1 week  Anxiety and depression Controlled on medication continue same  Seizure [facility]) Family requests Neurology follow up at [facility] with Neurology, pt to be maintained on current dose of kepra  Encounter for support and coordination of transition of care Patient in for follow up of recent hospitalization. Discharge summary, and laboratory and radiology data are reviewed, and any questions or concerns  are discussed. Specific issues requiring follow up are specifically addressed.   Excessive cerumen in ear canal, bilateral Successful ear flush of outer canals by Nursing,

## 2022-08-23 NOTE — Assessment & Plan Note (Signed)
Rept labs in 1 week

## 2022-08-23 NOTE — Assessment & Plan Note (Signed)
Patient in for follow up of recent hospitalization. Discharge summary, and laboratory and radiology data are reviewed, and any questions or concerns  are discussed. Specific issues requiring follow up are specifically addressed.  

## 2022-08-23 NOTE — Assessment & Plan Note (Signed)
Improved,will complete antibiotic course as out patient. Request for in ome antibiotics in th event of acute decompensation , not beneficial as she will need in hospital management

## 2022-08-23 NOTE — Assessment & Plan Note (Signed)
Family requests Neurology follow up at [facility] with Neurology, pt to be maintained on current dose of kepra

## 2022-08-23 NOTE — Assessment & Plan Note (Signed)
Resolved , home without supplemental oxygen

## 2022-08-25 ENCOUNTER — Encounter: Payer: Self-pay | Admitting: Family Medicine

## 2022-08-25 ENCOUNTER — Telehealth: Payer: Self-pay | Admitting: Family Medicine

## 2022-08-25 NOTE — Telephone Encounter (Signed)
Please call patients daughter regarding her medication since discharge from [facility].  She said some of her meds  are not correct

## 2022-08-26 NOTE — Telephone Encounter (Signed)
Verbal order given to [place].

## 2022-08-28 MED ORDER — IPRATROPIUM-ALBUTEROL 0.5-2.5 (3) MG/3ML IN SOLN: 3.0000 mL | Freq: Four times a day (QID) | RESPIRATORY_TRACT | 1 refills | Status: DC | PRN

## 2022-08-28 MED ORDER — DOXYCYCLINE HYCLATE 100 MG PO TABS: 100.0000 mg | ORAL_TABLET | Freq: Two times a day (BID) | ORAL | 0 refills | Status: DC

## 2022-08-28 NOTE — Telephone Encounter (Signed)
Neb soluution and doxycycline an antibiotic are prescribed and are at [facility]

## 2022-08-28 NOTE — Telephone Encounter (Signed)
Tara [name], [id number], daughter on phone, pt refuses to go to hosp. aspiration episode. needs to speak to nurse? / Daughter said [name] needs Antibiotic and Nebulizer medication.  Still coughing and will not go to ER.  Aspiration issues so they called EMS.

## 2022-08-29 DIAGNOSIS — K449 Diaphragmatic hernia without obstruction or gangrene: Secondary | ICD-10-CM

## 2022-08-29 DIAGNOSIS — E669 Obesity, unspecified: Secondary | ICD-10-CM

## 2022-08-29 DIAGNOSIS — I482 Chronic atrial fibrillation, unspecified: Secondary | ICD-10-CM

## 2022-08-29 DIAGNOSIS — Z792 Long term (current) use of antibiotics: Secondary | ICD-10-CM

## 2022-08-29 DIAGNOSIS — F419 Anxiety disorder, unspecified: Secondary | ICD-10-CM

## 2022-08-29 DIAGNOSIS — J690 Pneumonitis due to inhalation of food and vomit: Secondary | ICD-10-CM | POA: Diagnosis not present

## 2022-08-29 DIAGNOSIS — Z853 Personal history of malignant neoplasm of breast: Secondary | ICD-10-CM

## 2022-08-29 DIAGNOSIS — E785 Hyperlipidemia, unspecified: Secondary | ICD-10-CM

## 2022-08-29 DIAGNOSIS — I110 Hypertensive heart disease with heart failure: Secondary | ICD-10-CM | POA: Diagnosis not present

## 2022-08-29 DIAGNOSIS — I5022 Chronic systolic (congestive) heart failure: Secondary | ICD-10-CM

## 2022-08-29 DIAGNOSIS — R1319 Other dysphagia: Secondary | ICD-10-CM

## 2022-08-29 DIAGNOSIS — Z86011 Personal history of benign neoplasm of the brain: Secondary | ICD-10-CM

## 2022-08-29 DIAGNOSIS — G7281 Critical illness myopathy: Secondary | ICD-10-CM | POA: Diagnosis not present

## 2022-08-29 DIAGNOSIS — Z431 Encounter for attention to gastrostomy: Secondary | ICD-10-CM

## 2022-08-29 DIAGNOSIS — F32A Depression, unspecified: Secondary | ICD-10-CM

## 2022-08-29 DIAGNOSIS — I255 Ischemic cardiomyopathy: Secondary | ICD-10-CM

## 2022-08-29 DIAGNOSIS — Z9181 History of falling: Secondary | ICD-10-CM

## 2022-08-29 DIAGNOSIS — J9601 Acute respiratory failure with hypoxia: Secondary | ICD-10-CM | POA: Diagnosis not present

## 2022-08-29 DIAGNOSIS — I428 Other cardiomyopathies: Secondary | ICD-10-CM

## 2022-08-29 DIAGNOSIS — Z9011 Acquired absence of right breast and nipple: Secondary | ICD-10-CM

## 2022-08-29 DIAGNOSIS — D649 Anemia, unspecified: Secondary | ICD-10-CM

## 2022-08-29 DIAGNOSIS — Z6838 Body mass index (BMI) 38.0-38.9, adult: Secondary | ICD-10-CM

## 2022-08-29 DIAGNOSIS — Z7901 Long term (current) use of anticoagulants: Secondary | ICD-10-CM

## 2022-08-29 DIAGNOSIS — G40909 Epilepsy, unspecified, not intractable, without status epilepticus: Secondary | ICD-10-CM

## 2022-08-29 DIAGNOSIS — K219 Gastro-esophageal reflux disease without esophagitis: Secondary | ICD-10-CM

## 2022-09-01 ENCOUNTER — Telehealth: Payer: Self-pay | Admitting: Family Medicine

## 2022-09-01 DIAGNOSIS — D329 Benign neoplasm of meninges, unspecified: Secondary | ICD-10-CM | POA: Diagnosis not present

## 2022-09-01 DIAGNOSIS — E440 Moderate protein-calorie malnutrition: Secondary | ICD-10-CM | POA: Diagnosis not present

## 2022-09-01 DIAGNOSIS — R1314 Dysphagia, pharyngoesophageal phase: Secondary | ICD-10-CM | POA: Diagnosis not present

## 2022-09-01 DIAGNOSIS — R1312 Dysphagia, oropharyngeal phase: Secondary | ICD-10-CM | POA: Diagnosis not present

## 2022-09-01 NOTE — Telephone Encounter (Signed)
Janett Billow (nurse) called form [place] called to see if a suction machine has been ordered for patient.  Also some other DME equipment.  Please return Janett Billow call at [phone].

## 2022-09-02 DIAGNOSIS — I5022 Chronic systolic (congestive) heart failure: Secondary | ICD-10-CM | POA: Diagnosis not present

## 2022-09-02 DIAGNOSIS — I110 Hypertensive heart disease with heart failure: Secondary | ICD-10-CM | POA: Diagnosis not present

## 2022-09-02 DIAGNOSIS — D649 Anemia, unspecified: Secondary | ICD-10-CM | POA: Diagnosis not present

## 2022-09-02 NOTE — Telephone Encounter (Signed)
Unable to reach [place] or leave VM. Only thing ordered at last visit with our office was feeding supplement and nebulizer solution.

## 2022-09-03 DIAGNOSIS — E872 Acidosis, unspecified: Secondary | ICD-10-CM | POA: Diagnosis not present

## 2022-09-03 DIAGNOSIS — I4891 Unspecified atrial fibrillation: Secondary | ICD-10-CM | POA: Diagnosis not present

## 2022-09-03 DIAGNOSIS — N179 Acute kidney failure, unspecified: Secondary | ICD-10-CM | POA: Diagnosis not present

## 2022-09-03 DIAGNOSIS — F419 Anxiety disorder, unspecified: Secondary | ICD-10-CM | POA: Diagnosis not present

## 2022-09-04 ENCOUNTER — Encounter: Payer: Self-pay | Admitting: Family Medicine

## 2022-09-05 ENCOUNTER — Other Ambulatory Visit: Payer: Self-pay

## 2022-09-05 ENCOUNTER — Other Ambulatory Visit: Payer: Self-pay | Admitting: Family Medicine

## 2022-09-05 DIAGNOSIS — Z9011 Acquired absence of right breast and nipple: Secondary | ICD-10-CM

## 2022-09-05 DIAGNOSIS — Z792 Long term (current) use of antibiotics: Secondary | ICD-10-CM

## 2022-09-05 DIAGNOSIS — G7281 Critical illness myopathy: Secondary | ICD-10-CM | POA: Diagnosis not present

## 2022-09-05 DIAGNOSIS — D649 Anemia, unspecified: Secondary | ICD-10-CM

## 2022-09-05 DIAGNOSIS — Z86011 Personal history of benign neoplasm of the brain: Secondary | ICD-10-CM

## 2022-09-05 DIAGNOSIS — I110 Hypertensive heart disease with heart failure: Secondary | ICD-10-CM | POA: Diagnosis not present

## 2022-09-05 DIAGNOSIS — G40909 Epilepsy, unspecified, not intractable, without status epilepticus: Secondary | ICD-10-CM

## 2022-09-05 DIAGNOSIS — I5022 Chronic systolic (congestive) heart failure: Secondary | ICD-10-CM

## 2022-09-05 DIAGNOSIS — J9601 Acute respiratory failure with hypoxia: Secondary | ICD-10-CM | POA: Diagnosis not present

## 2022-09-05 DIAGNOSIS — Z431 Encounter for attention to gastrostomy: Secondary | ICD-10-CM

## 2022-09-05 DIAGNOSIS — I255 Ischemic cardiomyopathy: Secondary | ICD-10-CM

## 2022-09-05 DIAGNOSIS — I428 Other cardiomyopathies: Secondary | ICD-10-CM

## 2022-09-05 DIAGNOSIS — Z6838 Body mass index (BMI) 38.0-38.9, adult: Secondary | ICD-10-CM

## 2022-09-05 DIAGNOSIS — E669 Obesity, unspecified: Secondary | ICD-10-CM

## 2022-09-05 DIAGNOSIS — I482 Chronic atrial fibrillation, unspecified: Secondary | ICD-10-CM

## 2022-09-05 DIAGNOSIS — Z7901 Long term (current) use of anticoagulants: Secondary | ICD-10-CM

## 2022-09-05 DIAGNOSIS — F419 Anxiety disorder, unspecified: Secondary | ICD-10-CM

## 2022-09-05 DIAGNOSIS — E785 Hyperlipidemia, unspecified: Secondary | ICD-10-CM

## 2022-09-05 DIAGNOSIS — Z853 Personal history of malignant neoplasm of breast: Secondary | ICD-10-CM

## 2022-09-05 DIAGNOSIS — K449 Diaphragmatic hernia without obstruction or gangrene: Secondary | ICD-10-CM

## 2022-09-05 DIAGNOSIS — Z9181 History of falling: Secondary | ICD-10-CM

## 2022-09-05 DIAGNOSIS — K219 Gastro-esophageal reflux disease without esophagitis: Secondary | ICD-10-CM

## 2022-09-05 DIAGNOSIS — J690 Pneumonitis due to inhalation of food and vomit: Secondary | ICD-10-CM | POA: Diagnosis not present

## 2022-09-05 DIAGNOSIS — R1319 Other dysphagia: Secondary | ICD-10-CM

## 2022-09-05 DIAGNOSIS — F32A Depression, unspecified: Secondary | ICD-10-CM

## 2022-09-05 MED ORDER — UNABLE TO FIND: 12 refills | Status: DC

## 2022-09-05 MED ORDER — UNABLE TO FIND: 3 refills | Status: DC

## 2022-09-05 NOTE — Telephone Encounter (Signed)
Supplies ordered and sent to [facility].

## 2022-09-08 ENCOUNTER — Other Ambulatory Visit: Payer: Self-pay | Admitting: Family Medicine

## 2022-09-09 DIAGNOSIS — R1314 Dysphagia, pharyngoesophageal phase: Secondary | ICD-10-CM | POA: Diagnosis not present

## 2022-09-09 DIAGNOSIS — E440 Moderate protein-calorie malnutrition: Secondary | ICD-10-CM | POA: Diagnosis not present

## 2022-09-09 DIAGNOSIS — C50911 Malignant neoplasm of unspecified site of right female breast: Secondary | ICD-10-CM | POA: Diagnosis not present

## 2022-09-09 DIAGNOSIS — D329 Benign neoplasm of meninges, unspecified: Secondary | ICD-10-CM | POA: Diagnosis not present

## 2022-09-09 DIAGNOSIS — R1312 Dysphagia, oropharyngeal phase: Secondary | ICD-10-CM | POA: Diagnosis not present

## 2022-09-10 DIAGNOSIS — E785 Hyperlipidemia, unspecified: Secondary | ICD-10-CM

## 2022-09-10 DIAGNOSIS — F32A Depression, unspecified: Secondary | ICD-10-CM

## 2022-09-10 DIAGNOSIS — I428 Other cardiomyopathies: Secondary | ICD-10-CM

## 2022-09-10 DIAGNOSIS — Z9011 Acquired absence of right breast and nipple: Secondary | ICD-10-CM

## 2022-09-10 DIAGNOSIS — I255 Ischemic cardiomyopathy: Secondary | ICD-10-CM

## 2022-09-10 DIAGNOSIS — I110 Hypertensive heart disease with heart failure: Secondary | ICD-10-CM | POA: Diagnosis not present

## 2022-09-10 DIAGNOSIS — Z6838 Body mass index (BMI) 38.0-38.9, adult: Secondary | ICD-10-CM

## 2022-09-10 DIAGNOSIS — K449 Diaphragmatic hernia without obstruction or gangrene: Secondary | ICD-10-CM

## 2022-09-10 DIAGNOSIS — I482 Chronic atrial fibrillation, unspecified: Secondary | ICD-10-CM

## 2022-09-10 DIAGNOSIS — K219 Gastro-esophageal reflux disease without esophagitis: Secondary | ICD-10-CM

## 2022-09-10 DIAGNOSIS — F419 Anxiety disorder, unspecified: Secondary | ICD-10-CM

## 2022-09-10 DIAGNOSIS — I5022 Chronic systolic (congestive) heart failure: Secondary | ICD-10-CM

## 2022-09-10 DIAGNOSIS — D649 Anemia, unspecified: Secondary | ICD-10-CM

## 2022-09-10 DIAGNOSIS — G40909 Epilepsy, unspecified, not intractable, without status epilepticus: Secondary | ICD-10-CM

## 2022-09-10 DIAGNOSIS — Z7901 Long term (current) use of anticoagulants: Secondary | ICD-10-CM

## 2022-09-10 DIAGNOSIS — Z9181 History of falling: Secondary | ICD-10-CM

## 2022-09-10 DIAGNOSIS — G7281 Critical illness myopathy: Secondary | ICD-10-CM | POA: Diagnosis not present

## 2022-09-10 DIAGNOSIS — J690 Pneumonitis due to inhalation of food and vomit: Secondary | ICD-10-CM | POA: Diagnosis not present

## 2022-09-10 DIAGNOSIS — Z431 Encounter for attention to gastrostomy: Secondary | ICD-10-CM

## 2022-09-10 DIAGNOSIS — E669 Obesity, unspecified: Secondary | ICD-10-CM

## 2022-09-10 DIAGNOSIS — Z853 Personal history of malignant neoplasm of breast: Secondary | ICD-10-CM

## 2022-09-10 DIAGNOSIS — Z792 Long term (current) use of antibiotics: Secondary | ICD-10-CM

## 2022-09-10 DIAGNOSIS — Z86011 Personal history of benign neoplasm of the brain: Secondary | ICD-10-CM

## 2022-09-10 DIAGNOSIS — R1319 Other dysphagia: Secondary | ICD-10-CM

## 2022-09-10 DIAGNOSIS — J9601 Acute respiratory failure with hypoxia: Secondary | ICD-10-CM | POA: Diagnosis not present

## 2022-09-12 DIAGNOSIS — J9601 Acute respiratory failure with hypoxia: Secondary | ICD-10-CM | POA: Diagnosis not present

## 2022-09-12 DIAGNOSIS — Z6838 Body mass index (BMI) 38.0-38.9, adult: Secondary | ICD-10-CM

## 2022-09-12 DIAGNOSIS — Z792 Long term (current) use of antibiotics: Secondary | ICD-10-CM

## 2022-09-12 DIAGNOSIS — G40909 Epilepsy, unspecified, not intractable, without status epilepticus: Secondary | ICD-10-CM

## 2022-09-12 DIAGNOSIS — E872 Acidosis, unspecified: Secondary | ICD-10-CM | POA: Diagnosis not present

## 2022-09-12 DIAGNOSIS — R1319 Other dysphagia: Secondary | ICD-10-CM

## 2022-09-12 DIAGNOSIS — I5022 Chronic systolic (congestive) heart failure: Secondary | ICD-10-CM

## 2022-09-12 DIAGNOSIS — I110 Hypertensive heart disease with heart failure: Secondary | ICD-10-CM | POA: Diagnosis not present

## 2022-09-12 DIAGNOSIS — I428 Other cardiomyopathies: Secondary | ICD-10-CM

## 2022-09-12 DIAGNOSIS — K449 Diaphragmatic hernia without obstruction or gangrene: Secondary | ICD-10-CM

## 2022-09-12 DIAGNOSIS — F32A Depression, unspecified: Secondary | ICD-10-CM

## 2022-09-12 DIAGNOSIS — I255 Ischemic cardiomyopathy: Secondary | ICD-10-CM

## 2022-09-12 DIAGNOSIS — Z86011 Personal history of benign neoplasm of the brain: Secondary | ICD-10-CM

## 2022-09-12 DIAGNOSIS — Z9181 History of falling: Secondary | ICD-10-CM

## 2022-09-12 DIAGNOSIS — K219 Gastro-esophageal reflux disease without esophagitis: Secondary | ICD-10-CM

## 2022-09-12 DIAGNOSIS — E785 Hyperlipidemia, unspecified: Secondary | ICD-10-CM

## 2022-09-12 DIAGNOSIS — J690 Pneumonitis due to inhalation of food and vomit: Secondary | ICD-10-CM | POA: Diagnosis not present

## 2022-09-12 DIAGNOSIS — Z431 Encounter for attention to gastrostomy: Secondary | ICD-10-CM

## 2022-09-12 DIAGNOSIS — N179 Acute kidney failure, unspecified: Secondary | ICD-10-CM | POA: Diagnosis not present

## 2022-09-12 DIAGNOSIS — G7281 Critical illness myopathy: Secondary | ICD-10-CM | POA: Diagnosis not present

## 2022-09-12 DIAGNOSIS — F419 Anxiety disorder, unspecified: Secondary | ICD-10-CM | POA: Diagnosis not present

## 2022-09-12 DIAGNOSIS — D649 Anemia, unspecified: Secondary | ICD-10-CM

## 2022-09-12 DIAGNOSIS — I482 Chronic atrial fibrillation, unspecified: Secondary | ICD-10-CM

## 2022-09-12 DIAGNOSIS — I4891 Unspecified atrial fibrillation: Secondary | ICD-10-CM | POA: Diagnosis not present

## 2022-09-12 DIAGNOSIS — Z7901 Long term (current) use of anticoagulants: Secondary | ICD-10-CM

## 2022-09-12 DIAGNOSIS — Z9011 Acquired absence of right breast and nipple: Secondary | ICD-10-CM

## 2022-09-12 DIAGNOSIS — E669 Obesity, unspecified: Secondary | ICD-10-CM

## 2022-09-12 DIAGNOSIS — Z853 Personal history of malignant neoplasm of breast: Secondary | ICD-10-CM

## 2022-09-18 ENCOUNTER — Telehealth: Payer: Self-pay | Admitting: Family Medicine

## 2022-09-18 NOTE — Telephone Encounter (Signed)
Danae Chen, [place]  Wants to know if they can extend care?  1x wk for 4 wk

## 2022-09-19 ENCOUNTER — Telehealth: Payer: Self-pay | Admitting: Family Medicine

## 2022-09-19 NOTE — Telephone Encounter (Signed)
Verbal ok given.

## 2022-09-19 NOTE — Telephone Encounter (Signed)
[place], [phone]   Called stating pt has EMS come last night & they advised her to go to er. Pt refused. Currently her O2 is 90%, weight 146, has vomiting & no good BM for several days. Please advise

## 2022-09-19 NOTE — Telephone Encounter (Signed)
I spoke with patient and spouse, she repeated that she will not go to the hospital. Spouse requests nausea medication, on review there is drug interaction that may affect her heart, no medication prescribed

## 2022-09-20 ENCOUNTER — Encounter (HOSPITAL_COMMUNITY): Payer: Self-pay | Admitting: *Deleted

## 2022-09-20 ENCOUNTER — Emergency Department (HOSPITAL_COMMUNITY): Payer: Medicare Other

## 2022-09-20 ENCOUNTER — Observation Stay (HOSPITAL_COMMUNITY): Payer: Medicare Other

## 2022-09-20 ENCOUNTER — Observation Stay (HOSPITAL_COMMUNITY)
Admission: EM | Admit: 2022-09-20 | Discharge: 2022-09-22 | Disposition: A | Discharge status: Home / Self Care | Payer: Medicare Other | Attending: Internal Medicine | Admitting: Internal Medicine

## 2022-09-20 ENCOUNTER — Other Ambulatory Visit: Payer: Self-pay

## 2022-09-20 DIAGNOSIS — R1310 Dysphagia, unspecified: Secondary | ICD-10-CM | POA: Diagnosis not present

## 2022-09-20 DIAGNOSIS — D72829 Elevated white blood cell count, unspecified: Secondary | ICD-10-CM | POA: Diagnosis not present

## 2022-09-20 DIAGNOSIS — Z1152 Encounter for screening for COVID-19: Secondary | ICD-10-CM | POA: Diagnosis not present

## 2022-09-20 DIAGNOSIS — I480 Paroxysmal atrial fibrillation: Secondary | ICD-10-CM | POA: Diagnosis present

## 2022-09-20 DIAGNOSIS — R9431 Abnormal electrocardiogram [ECG] [EKG]: Secondary | ICD-10-CM | POA: Diagnosis present

## 2022-09-20 DIAGNOSIS — N179 Acute kidney failure, unspecified: Principal | ICD-10-CM | POA: Diagnosis present

## 2022-09-20 DIAGNOSIS — Z931 Gastrostomy status: Secondary | ICD-10-CM | POA: Diagnosis not present

## 2022-09-20 DIAGNOSIS — R0602 Shortness of breath: Secondary | ICD-10-CM | POA: Diagnosis not present

## 2022-09-20 DIAGNOSIS — F419 Anxiety disorder, unspecified: Secondary | ICD-10-CM

## 2022-09-20 DIAGNOSIS — Z853 Personal history of malignant neoplasm of breast: Secondary | ICD-10-CM | POA: Insufficient documentation

## 2022-09-20 DIAGNOSIS — I110 Hypertensive heart disease with heart failure: Secondary | ICD-10-CM | POA: Diagnosis not present

## 2022-09-20 DIAGNOSIS — I5022 Chronic systolic (congestive) heart failure: Secondary | ICD-10-CM | POA: Diagnosis present

## 2022-09-20 DIAGNOSIS — Z4682 Encounter for fitting and adjustment of non-vascular catheter: Secondary | ICD-10-CM | POA: Diagnosis not present

## 2022-09-20 DIAGNOSIS — K449 Diaphragmatic hernia without obstruction or gangrene: Secondary | ICD-10-CM

## 2022-09-20 DIAGNOSIS — G40909 Epilepsy, unspecified, not intractable, without status epilepticus: Secondary | ICD-10-CM

## 2022-09-20 DIAGNOSIS — F32A Depression, unspecified: Secondary | ICD-10-CM | POA: Diagnosis present

## 2022-09-20 DIAGNOSIS — Z79899 Other long term (current) drug therapy: Secondary | ICD-10-CM | POA: Diagnosis not present

## 2022-09-20 DIAGNOSIS — I4581 Long QT syndrome: Secondary | ICD-10-CM | POA: Diagnosis not present

## 2022-09-20 DIAGNOSIS — K2100 Gastro-esophageal reflux disease with esophagitis, without bleeding: Secondary | ICD-10-CM

## 2022-09-20 DIAGNOSIS — R053 Chronic cough: Secondary | ICD-10-CM | POA: Diagnosis present

## 2022-09-20 DIAGNOSIS — D329 Benign neoplasm of meninges, unspecified: Secondary | ICD-10-CM

## 2022-09-20 DIAGNOSIS — R1314 Dysphagia, pharyngoesophageal phase: Secondary | ICD-10-CM | POA: Diagnosis not present

## 2022-09-20 DIAGNOSIS — Z7901 Long term (current) use of anticoagulants: Secondary | ICD-10-CM | POA: Diagnosis not present

## 2022-09-20 DIAGNOSIS — D320 Benign neoplasm of cerebral meninges: Secondary | ICD-10-CM | POA: Diagnosis present

## 2022-09-20 DIAGNOSIS — K219 Gastro-esophageal reflux disease without esophagitis: Secondary | ICD-10-CM | POA: Diagnosis present

## 2022-09-20 LAB — BRAIN NATRIURETIC PEPTIDE: B Natriuretic Peptide: 69.3 pg/mL (ref 0.0–100.0)

## 2022-09-20 LAB — CBC WITH DIFFERENTIAL/PLATELET
Abs Immature Granulocytes: 0.06 10*3/uL (ref 0.00–0.07)
Basophils Absolute: 0 10*3/uL (ref 0.0–0.1)
Basophils Relative: 0 %
Eosinophils Absolute: 0.5 10*3/uL (ref 0.0–0.5)
Eosinophils Relative: 4 %
HCT: 34.7 % — ABNORMAL LOW (ref 36.0–46.0)
Hemoglobin: 10.9 g/dL — ABNORMAL LOW (ref 12.0–15.0)
Immature Granulocytes: 0 %
Lymphocytes Relative: 10 %
Lymphs Abs: 1.5 10*3/uL (ref 0.7–4.0)
MCH: 25.3 pg — ABNORMAL LOW (ref 26.0–34.0)
MCHC: 31.4 g/dL (ref 30.0–36.0)
MCV: 80.5 fL (ref 80.0–100.0)
Monocytes Absolute: 0.8 10*3/uL (ref 0.1–1.0)
Monocytes Relative: 6 %
Neutro Abs: 11.5 10*3/uL — ABNORMAL HIGH (ref 1.7–7.7)
Neutrophils Relative %: 80 %
Platelets: 292 10*3/uL (ref 150–400)
RBC: 4.31 MIL/uL (ref 3.87–5.11)
RDW: 16.6 % — ABNORMAL HIGH (ref 11.5–15.5)
WBC: 14.4 10*3/uL — ABNORMAL HIGH (ref 4.0–10.5)
nRBC: 0 % (ref 0.0–0.2)

## 2022-09-20 LAB — COMPREHENSIVE METABOLIC PANEL
ALT: 18 U/L (ref 0–44)
AST: 24 U/L (ref 15–41)
Albumin: 3.3 g/dL — ABNORMAL LOW (ref 3.5–5.0)
Alkaline Phosphatase: 77 U/L (ref 38–126)
Anion gap: 10 (ref 5–15)
BUN: 27 mg/dL — ABNORMAL HIGH (ref 8–23)
CO2: 22 mmol/L (ref 22–32)
Calcium: 9.4 mg/dL (ref 8.9–10.3)
Chloride: 105 mmol/L (ref 98–111)
Creatinine, Ser: 1.16 mg/dL — ABNORMAL HIGH (ref 0.44–1.00)
GFR, Estimated: 49 mL/min — ABNORMAL LOW (ref >60)
Glucose, Bld: 114 mg/dL — ABNORMAL HIGH (ref 70–99)
Potassium: 3.6 mmol/L (ref 3.5–5.1)
Sodium: 137 mmol/L (ref 135–145)
Total Bilirubin: 0.8 mg/dL (ref 0.3–1.2)
Total Protein: 7.5 g/dL (ref 6.5–8.1)

## 2022-09-20 LAB — TROPONIN I (HIGH SENSITIVITY): Troponin I (High Sensitivity): 10 ng/L (ref <18)

## 2022-09-20 LAB — RESP PANEL BY RT-PCR (RSV, FLU A&B, COVID)  RVPGX2
Influenza A by PCR: NEGATIVE
Influenza B by PCR: NEGATIVE
Resp Syncytial Virus by PCR: NEGATIVE
SARS Coronavirus 2 by RT PCR: NEGATIVE

## 2022-09-20 LAB — C-REACTIVE PROTEIN: CRP: 3.1 mg/dL — ABNORMAL HIGH (ref <1.0)

## 2022-09-20 LAB — PROCALCITONIN: Procalcitonin: <0.1 ng/mL

## 2022-09-20 MED ORDER — PANCRELIPASE (LIP-PROT-AMYL) 10440-39150 UNITS PO TABS
20880.0000 [IU] | ORAL_TABLET | Freq: Once | ORAL | Status: DC
  Filled 2022-09-20: qty 2

## 2022-09-20 MED ORDER — APIXABAN 5 MG PO TABS
5.0000 mg | ORAL_TABLET | Freq: Two times a day (BID) | ORAL | Status: DC
  Administered 2022-09-21 – 2022-09-22 (×2): 5 mg
  Filled 2022-09-20 (×4): qty 1

## 2022-09-20 MED ORDER — SERTRALINE HCL 50 MG PO TABS
25.0000 mg | ORAL_TABLET | Freq: Every day | ORAL | Status: DC
  Administered 2022-09-22: 25 mg
  Filled 2022-09-20 (×2): qty 1

## 2022-09-20 MED ORDER — SODIUM CHLORIDE 0.9 % IV SOLN
200.0000 mg | Freq: Two times a day (BID) | INTRAVENOUS | Status: DC
  Administered 2022-09-20: 200 mg via INTRAVENOUS
  Filled 2022-09-20 (×3): qty 20

## 2022-09-20 MED ORDER — SODIUM BICARBONATE 650 MG PO TABS: 650.0000 mg | ORAL_TABLET | Freq: Once | ORAL | Status: DC

## 2022-09-20 MED ORDER — SODIUM CHLORIDE 0.9 % IV SOLN: INTRAVENOUS | Status: AC

## 2022-09-20 MED ORDER — CLONAZEPAM 0.1 MG/ML ORAL SUSPENSION: 0.5000 mg | Freq: Every day | ORAL | Status: DC

## 2022-09-20 MED ORDER — ROSUVASTATIN CALCIUM 20 MG PO TABS: 20.0000 mg | ORAL_TABLET | Freq: Every day | ORAL | Status: DC

## 2022-09-20 MED ORDER — LACTATED RINGERS IV BOLUS
1000.0000 mL | Freq: Once | INTRAVENOUS | Status: AC
  Administered 2022-09-20: 1000 mL via INTRAVENOUS

## 2022-09-20 MED ORDER — CLONAZEPAM 0.5 MG PO TABS
0.5000 mg | ORAL_TABLET | Freq: Every day | ORAL | Status: DC
  Filled 2022-09-20: qty 1

## 2022-09-20 MED ORDER — ACETAMINOPHEN 650 MG RE SUPP: 650.0000 mg | Freq: Four times a day (QID) | RECTAL | Status: DC | PRN

## 2022-09-20 MED ORDER — CLONAZEPAM 0.5 MG PO TABS: 0.5000 mg | ORAL_TABLET | Freq: Every day | ORAL | Status: DC

## 2022-09-20 MED ORDER — PROSOURCE TF20 ENFIT COMPATIBL EN LIQD: 60.0000 mL | Freq: Every day | ENTERAL | Status: DC

## 2022-09-20 MED ORDER — LACOSAMIDE 50 MG PO TABS
200.0000 mg | ORAL_TABLET | Freq: Two times a day (BID) | ORAL | Status: DC
  Filled 2022-09-20: qty 4

## 2022-09-20 MED ORDER — ALBUTEROL SULFATE (2.5 MG/3ML) 0.083% IN NEBU: 2.5000 mg | INHALATION_SOLUTION | Freq: Four times a day (QID) | RESPIRATORY_TRACT | Status: DC | PRN

## 2022-09-20 MED ORDER — SODIUM CHLORIDE 0.9% FLUSH
3.0000 mL | Freq: Two times a day (BID) | INTRAVENOUS | Status: DC
  Administered 2022-09-21: 3 mL via INTRAVENOUS

## 2022-09-20 MED ORDER — SODIUM CHLORIDE 0.9 % IV SOLN: INTRAVENOUS | Status: DC

## 2022-09-20 MED ORDER — JEVITY 1.5 CAL/FIBER PO LIQD: 1000.0000 mL | ORAL | Status: DC

## 2022-09-20 MED ORDER — DIATRIZOATE MEGLUMINE & SODIUM 66-10 % PO SOLN
ORAL | Status: AC
  Filled 2022-09-20: qty 30

## 2022-09-20 MED ORDER — ACETAMINOPHEN 325 MG PO TABS: 650.0000 mg | ORAL_TABLET | Freq: Four times a day (QID) | ORAL | Status: DC | PRN

## 2022-09-20 MED ORDER — TRIMETHOBENZAMIDE HCL 100 MG/ML IM SOLN: 200.0000 mg | Freq: Four times a day (QID) | INTRAMUSCULAR | Status: DC | PRN

## 2022-09-20 MED ORDER — MONTELUKAST SODIUM 10 MG PO TABS
10.0000 mg | ORAL_TABLET | Freq: Every day | ORAL | Status: DC
  Administered 2022-09-21: 10 mg
  Filled 2022-09-20 (×2): qty 1

## 2022-09-20 MED ORDER — SODIUM CHLORIDE 0.9 % IV SOLN
750.0000 mg | Freq: Once | INTRAVENOUS | Status: AC
  Administered 2022-09-20: 750 mg via INTRAVENOUS
  Filled 2022-09-20: qty 7.5

## 2022-09-20 MED ORDER — LEVETIRACETAM 100 MG/ML PO SOLN
750.0000 mg | Freq: Two times a day (BID) | ORAL | Status: DC
  Filled 2022-09-20 (×2): qty 10

## 2022-09-20 MED ORDER — JEVITY 1.5 CAL/FIBER PO LIQD
1000.0000 mL | ORAL | Status: DC
  Filled 2022-09-20: qty 1000

## 2022-09-20 MED ORDER — CLONAZEPAM 0.5 MG PO TBDP: 0.5000 mg | ORAL_TABLET | Freq: Every day | ORAL | Status: DC

## 2022-09-20 NOTE — ED Provider Notes (Signed)
Care assumed after signout from EDP Dr. Tinnie Gens.  Patient to be admitted for failing to tolerate tube feeds.  Hospitalist service made aware of case and will evaluate for admission.   Valarie Merino, MD 09/20/22 [phone]

## 2022-09-20 NOTE — ED Notes (Signed)
Requested ordered medication from pharmacy 

## 2022-09-20 NOTE — ED Triage Notes (Signed)
The pt reports that her feeding tube is not working she has had emesis and the pt s family  reports that she has had this tube for 4 months after she had brain surgery.  She has had pneumonia  several times since the surgery

## 2022-09-20 NOTE — Progress Notes (Signed)
NEW ADMISSION NOTE New Admission Note:   Arrival Method: stretcher Mental Orientation: A & O x 4 Telemetry: yes Assessment: Completed Skin: Intact IV:Lt forearm Pain: 3/10 Tubes: G tube Safety Measures: Safety Fall Prevention Plan has been given, discussed and implemented. Admission: Completed [facility] Orientation: Patient has been orientated to the room, unit and staff.  Family: Husband at bedside  Orders have been reviewed and implemented. Will continue to monitor the patient. Call light has been placed within reach and bed alarm has been activated.   Keenan Bachelor, RN

## 2022-09-20 NOTE — ED Notes (Signed)
Pt had 1 large liquid stool, this RN and NT to change pt and bed linen.

## 2022-09-20 NOTE — H&P (Addendum)
History and Physical    Patient: Tara [name] DDU:[id number] DOB: 1946/04/15 DOA: 09/20/2022 DOS: the patient was seen and examined on 09/20/2022 PCP: [name], MD  Patient coming from: Home  Chief Complaint:  Chief Complaint  Patient presents with   Shortness of Breath   HPI: Tara [name] is a 76 y.o. female with medical history significant of history of meningioma s/p resection 03/2019., history of breast cancer, hyperlipidemia, hypertension, nonischemic myopathy, systolic CHF, recurrent aspiration pneumonia, dysphagia s/p PEG, seizure disorder, anxiety, depression, large sliding hernia, and GERD who presents due to reports of her gastric tube not working.  History is from the patient with assistance of her husband who is present at bedside.  At home patient has been receiving tube feeds and meds through her gastric tube which is red in color and nothing through the yellow J-tube.  Husband not that she has been gassy, coughing,  vomiting, and lethargy.  He relates this to her seizure medications and states that even though she received the medications IV this morning.  He reports that the coughing spells last approximately 2 hours before resolving.  He is not sure if the coughing leads to vomiting or if it is possibly her tube feeds.  She last received Eliquis yesterday evening.  Patient has been told not to have anything by mouth after having recurrent admissions for aspiration pneumonia.   In the emergency department patient was noted to be afebrile with mild tachypnea, and all other vital signs relatively maintained.  Labs significant for WBC 14.4, hemoglobin 10.9, BUN 27, and creatinine 1.16.  Chest x-ray noted changes of prior granulomatous disease with no acute abnormality appreciated.  Influenza and COVID-19 screening were negative.  Patient has been given 1 L lactated Ringer's, Keppra 750 mg IV, and Vimpat 200 mg IV.  Review of Systems: As mentioned in the history of  present illness. All other systems reviewed and are negative. 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   Anxiety    Breast cancer ([place]) 2008   right - s/p lumpectomy->chemo, radiation   Depression    Dysrhythmia    hx SVT   Family history of colon cancer    Family history of prostate cancer    GERD (gastroesophageal reflux disease)    H/O: hysterectomy    Hiatal hernia    History of cancer chemotherapy    History of radiation therapy    Hyperlipidemia    Hypertension 01/25/2018   Nonischemic cardiomyopathy ([place])    Personal history of radiation therapy 01/05/209   SVT (supraventricular tachycardia)    short RP SVT documented 2/37   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[place])    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)   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   Past Surgical History:  Procedure Laterality Date   ABDOMINAL HYSTERECTOMY  1994   fibroids,    BIOPSY  06/19/2016   Procedure: BIOPSY;  Surgeon: Daneil Dolin, MD;  [facility];  Service: Endoscopy;;  gastric duodenum   BREAST BIOPSY Right 12/16/2006   malignant   BREAST EXCISIONAL BIOPSY Right 2018   benign lumpectomy   BREAST LUMPECTOMY Right 02/2007   BREAST LUMPECTOMY WITH RADIOACTIVE SEED LOCALIZATION Right 07/29/2017   Procedure: RIGHT BREAST LUMPECTOMY WITH RADIOACTIVE SEED LOCALIZATION;  Surgeon: Fanny Skates, MD;  Location:  [facility];  Service: General;  Laterality: Right;   BREAST SURGERY Right 2008   lumpectomy, cancer   CARDIAC CATHETERIZATION     CHOLECYSTECTOMY  1999   COLONOSCOPY  2008   Dr. Oneida Alar: multiple polyps. Path not available at time of visit.    COLONOSCOPY WITH PROPOFOL N/A 04/25/2014   Dr. Oneida Alar: Multiple tubular adenomas removed. Diverticulosis. Moderate internal hemorrhoids. Next colonoscopy planned for August 2018.   CRANIOTOMY Bilateral 03/24/2022   Procedure:  Bifrontal craniotomy for resection of meningioma;  Surgeon: Ashok Pall, MD;  Location: [facility];  Service: Neurosurgery;  Laterality: Bilateral;   ESOPHAGOGASTRODUODENOSCOPY (EGD) WITH PROPOFOL N/A 06/19/2016   Procedure: ESOPHAGOGASTRODUODENOSCOPY (EGD) WITH PROPOFOL;  Surgeon: Daneil Dolin, MD;  [facility];  Service: Endoscopy;  Laterality: N/A;   MASTECTOMY W/ SENTINEL NODE BIOPSY Right 06/09/2018   Procedure: RIGHT TOTAL MASTECTOMY WITH SENTINEL LYMPH NODE BIOPSY;  Surgeon: Fanny Skates, MD;  Location: [facility];  Service: General;  Laterality: Right;   POLYPECTOMY N/A 04/25/2014   Procedure: POLYPECTOMY;  Surgeon: Danie Binder, MD;  Location: [facility];  Service: Endoscopy;  Laterality: N/A;  Ascending and Decending Colon x3 , Transverse colon x2, rectal   PORTACATH PLACEMENT N/A 06/09/2018   Procedure: INSERTION PORT-A-CATH;  Surgeon: Fanny Skates, MD;  Location: [facility];  Service: General;  Laterality: N/A;   RIGHT/LEFT HEART CATH AND CORONARY ANGIOGRAPHY N/A 07/10/2017   Procedure: RIGHT/LEFT HEART CATH AND CORONARY ANGIOGRAPHY;  Surgeon: Larey Dresser, MD;  Location: [facility];  Service: Cardiovascular;  Laterality: N/A;   Social History:  reports that she has never smoked. She has never used smokeless tobacco. She reports that she does not drink alcohol and does not use drugs.  Allergies  Allergen Reactions   Aspirin Other (See Comments)    Reports GI bleed--pt is currently taking   Lisinopril Cough   Sudafed [Pseudoephedrine Hcl]     Pt reports she had drainage in throat that made her throat hurt.     Family History  Problem Relation Age of Onset   Colon cancer Father 40       died at age 54   Hypertension Sister    Heart disease Sister    Cancer Sister        unknown form   Dementia Mother    Stroke Mother 42       left hemiparesis   Cancer Brother 36       prostate   Cancer Paternal Aunt        NOS   Lung cancer Paternal Uncle    Other Paternal  Uncle        lightning strike   Other Paternal Uncle        hit by train   Other Paternal 32        old age   79 Cousin        NOS pat first cousin   Cancer Cousin        NOS pat first cousin   Prostate cancer Cousin        pat first cousin    Prior to Admission medications   Medication Sig Start Date End Date Taking? Authorizing Provider  acetaminophen (TYLENOL) 500 MG tablet Take 500-1,000 mg by mouth every 6 (six) hours as needed (FOR PAIN.).    [provider]  amiodarone (PACERONE) 200 MG tablet Take 1 tablet (200 mg total) by mouth daily. 08/22/22   [name], MD  Cholecalciferol (VITAMIN D) 50 MCG (2000 UT) CAPS Take 2,000 Units by mouth daily.    [provider]  clonazePAM (KLONOPIN) 0.5 MG tablet Take 1 tablet (0.5 mg total) by mouth at bedtime. 08/20/22   [name], MD  docusate sodium (COLACE) 100 MG capsule Take 200 mg by mouth 2 (two) times daily as needed for mild constipation.    [provider]  doxycycline (VIBRA-TABS) 100 MG tablet Take 1 tablet (100 mg total) by mouth 2 (two) times daily. 08/28/22   [name], MD  ELIQUIS 5 MG TABS tablet TAKE 1 TABLET VIA G-TUBE 2 TIMES DAILY. 09/05/22   [name], MD  ipratropium-albuterol (DUONEB) 0.5-2.5 (3) MG/3ML SOLN USe 1 vial by nebulization every 6 (six) hours as needed for shortness of breath. 05/30/22 08/20/22  Arrien, Jimmy Picket, MD  ipratropium-albuterol (DUONEB) 0.5-2.5 (3) MG/3ML SOLN Take 3 mLs by nebulization every 6 (six) hours as needed. 08/22/22   [name], MD  ipratropium-albuterol (DUONEB) 0.5-2.5 (3) MG/3ML SOLN Take 3 mLs by nebulization every 6 (six) hours as needed. 08/28/22   [name], MD  lacosamide (VIMPAT) 10 MG/ML oral solution Take 200 mg by mouth 2 (two) times daily.    [provider]  levETIRAcetam (KEPPRA) 100 MG/ML solution TAKE 7.5 MLS VIA G-TUBE 2 TIMES DAILY. 09/09/22   [name], MD   montelukast (SINGULAIR) 10 MG tablet Take 1 tablet (10 mg total) by mouth at bedtime. 08/20/22   [name], MD  Nutritional Supplements (FEEDING SUPPLEMENT, JEVITY 1.5 CAL/FIBER,) LIQD Place 1,000 mLs into feeding tube daily. 08/22/22 10/21/22  [name], MD  pantoprazole (PROTONIX) 40 MG tablet TAKE (1) TABLET BY MOUTH TWICE DAILY. 08/20/22   [name], MD  Protein (FEEDING SUPPLEMENT, PROSOURCE TF20,) liquid Place 60 mLs into feeding tube daily. 08/22/22   [name], MD  rosuvastatin (CRESTOR) 20 MG tablet Take 1 tablet (20 mg total) by mouth daily. 08/20/22   [name], MD  sertraline (ZOLOFT) 25 MG tablet Take 1 tablet (25 mg total) by mouth daily. 01/20/22   [name], [place] Name: Prosource-TF 20 Liquid Place 60 MLS into feeding tube daily. 09/05/22   [name], MD  UNABLE TO FIND Med Name: Gloves Size:Medium 09/05/22   [name], MD  UNABLE TO FIND Med Name: Disposable Bed Pads Size: Addison Naegeli 09/05/22   [name], MD  UNABLE TO FIND Med Name: Adult Pull up Briefs Size : L 09/05/22   [name], MD  Water For Irrigation, Sterile (FREE WATER) SOLN Place 100 mLs into feeding tube every 12 (twelve) hours. (Slowly) 08/18/22   Barton Dubois, MD    Physical Exam: Vitals:   09/20/22 0715 09/20/22 0730 09/20/22 0745 09/20/22 0749  BP: (!) 109/57 116/62 112/68   Pulse: 74 76 77   Resp: (!) 21 (!) 23 18   Temp:    (!) 97.5 F (36.4 C)  TempSrc:    Oral  SpO2: 94% 96% 97%   Weight:      Height:       Exam  Constitutional: Elderly female currently in no acute distress Eyes: PERRL, lids and conjunctivae normal ENMT: Mucous membranes are dry. Neck: normal, supple  Respiratory: clear to auscultation bilaterally, no wheezing, no crackles. Normal respiratory effort. No accessory muscle use.  Cardiovascular: Regular rate and rhythm, no murmurs / rubs / gallops. No extremity edema.  Abdomen: no tenderness, JG tube present of the abdomen. Musculoskeletal: no clubbing / cyanosis.  Skin: no rashes, lesions, ulcers. No induration Neurologic: CN 2-12 grossly intact.  Right lower extremity weakness 3/5 and all other extremities appear at least 4+/5. Psychiatric: Normal judgment and insight. Alert and oriented x 3. Normal mood.   Data Reviewed:  EKG reveals sinus rhythm at 76 bpm with QTc 593.  Reviewed labs, imaging and pertinent records as noted above in HPI  Assessment and Plan:  S/p JG tube Dysphagia  Patient has been told not to take any p.o. meds or food after having recurrent aspiration pneumonia.  Husband notes that he had only been using her G-tube and had not been using her J-tube at home, but patient was not tolerating tube feeds.  Injected contrast was given which noted correct placement of PEG tube.  IR noted correct placement of PEG tube without any abnormality. -Admit to a medical telemetry bed -PEG tube order set utilized -Nursing care order for all tube feeds through J-tube plan on giving certain medications due to the J-tube and others through the G-tube.  Husband updated at bedside and reported understanding. -Resume prior tube feeds at 50 mL/h and adjust per nutrition recommendations -Dietary consult for tube feed adjustment if needed  Leukocytosis Acute.  WBC elevated at 14.4.  Chest x-ray noted chronic granulomatous disease. -Check procalcitonin and CRP -Check urinalysis.  Acute kidney injury On admission creatinine 1.16 with BUN 27.  Baseline creatinine previously noted to be around 0.7.  Patient has been unable to tolerate tube feeds due to coughing.  Patient has been given 1 L normal saline IV fluids. -Normal saline IV fluids at 75 mL/h for 1 L / 15 hours -Recheck kidney function in a.m.  Meningioma s/p craniotomy Patient had craniotomy for meningioma back in 03/2022.  Post procedure patient reported residual right lower extremity weakness. -PT  to evaluate and treat for weakness  Cough Acute on chronic.  Patient noted to have episodes of coughing spells after receiving medications and tube feeds.  Suspect likely related to large hiatal hernia. -Elevate head of bed greater than 30 degrees and try and never lay patient flat -Follow-up procalcitonin  Paroxysmal atrial fibrillation on chronic anticoagulation Patient appears to be in sinus rhythm at this time. -Continue amiodarone and Eliquis  Seizure disorder No recent episodes of seizure-like activity.  Neurology agreed that symptoms seem to be more likely secondary to gastrointestinal issue and less likely related to seizure medications. -Continue home seizure medication regimen -Recommend outpatient follow-up with neurology  Prolonged QT interval QTc was 593 on admission.   -Avoid QT prolonging medications -Recheck EKG in a.m.  Sliding hiatal hernia GERD Noted to have a large sliding hiatal hernia on last CT in the abdomen pelvis from 04/2022. -Held Protonix due to QTc.  Resume when medically appropriate -Elevate head of the bed greater than 30 degrees  Systolic congestive heart failure Patient appears to be hypovolemic at this time.  Last echocardiogram noted EF to be 40 to 45% with indeterminate diastolic parameters in 01/6432. -Strict I&Os and daily weights  Hyperlipidemia -Continue Crestor  Anxiety depression -Continue Zoloft   DVT prophylaxis: Eliquis Advance Care Planning:   Code Status: DNR.  Confirmed with husband present at bedside  Consults: Discussed with IR and stated that J-tube correctly placed.  Family Communication: Husband updated at bedside  Severity of Illness: The appropriate patient status for this patient is OBSERVATION. Observation status is judged to be reasonable and necessary  in order to provide the required intensity of service to ensure the patient's safety. The patient's presenting symptoms, physical exam findings, and initial  radiographic and laboratory data in the context of their medical condition is felt to place them at decreased risk for further clinical deterioration. Furthermore, it is anticipated that the patient will be medically stable for discharge from the hospital within 2 midnights of admission.   Author: Norval Morton, MD 09/20/2022 8:27 AM  For on call review [email or web address].

## 2022-09-20 NOTE — ED Provider Triage Note (Signed)
Emergency Medicine Provider Triage Evaluation Note  Tara [name], a 76 y.o. female  was evaluated in triage. Pt complains of worsening of breath.  She does have a history of heart failure.  She had a prolonged admission to the hospital several months ago after surgery for meningioma.  She has a feeding tube currently and family states that when she gets tube feeds, she regurgitates and vomits.  She has had aspiration pneumonia.  Breathing was worsening tonight, prompting emergency visit.  Review of Systems  Positive: Shortness of breath, cough Negative: Fever  Physical Exam  BP 107/67   Pulse 90   Resp 18   Ht '4\' 9"'$  (1.448 m)   Wt 71.8 kg   SpO2 97%   BMI 34.25 kg/m  Gen:   Awake, no distress   Resp:  Normal effort; Crackles middle right, right base, left base MSK:   Moves extremities without difficulty  Other:   no lower extremity edema  Medical Decision Making  Medically screening exam initiated at 1:38 AM.  Appropriate orders placed.  ZENAYA ULATOWSKI was informed that the remainder of the evaluation will be completed by another provider, this initial triage assessment does not replace that evaluation, and the importance of remaining in the ED until their evaluation is complete.     Carlisle Cater, PA-C 09/20/22 [phone]

## 2022-09-20 NOTE — Evaluation (Signed)
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   Anxiety    Breast cancer ([place]) 2008   right - s/p lumpectomy->chemo, radiation   Depression    Dysrhythmia    hx SVT   Family history of colon cancer    Family history of prostate cancer    GERD (gastroesophageal reflux disease)    H/O: hysterectomy    Hiatal hernia    History of cancer chemotherapy    History of radiation therapy    Hyperlipidemia    Hypertension 01/25/2018   Nonischemic cardiomyopathy ([place])    Personal history of radiation therapy 01/05/209   SVT (supraventricular tachycardia)    short RP SVT documented 7/62   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[place])    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)   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   Past Surgical History:  Past Surgical History:  Procedure Laterality Date   ABDOMINAL HYSTERECTOMY  1994   fibroids,    BIOPSY  06/19/2016   Procedure: BIOPSY;  Surgeon: Daneil Dolin, MD;  [facility];  Service: Endoscopy;;  gastric duodenum   BREAST BIOPSY Right 12/16/2006   malignant   BREAST EXCISIONAL BIOPSY Right 2018   benign lumpectomy   BREAST LUMPECTOMY Right 02/2007   BREAST LUMPECTOMY WITH RADIOACTIVE SEED LOCALIZATION Right 07/29/2017   Procedure: RIGHT BREAST LUMPECTOMY WITH RADIOACTIVE SEED LOCALIZATION;  Surgeon: Fanny Skates, MD;  Location: [place];  Service: General;  Laterality: Right;   BREAST SURGERY Right 2008   lumpectomy, cancer   CARDIAC CATHETERIZATION     CHOLECYSTECTOMY  1999   COLONOSCOPY  2008   Dr. Oneida Alar: multiple polyps. Path not  available at time of visit.    COLONOSCOPY WITH PROPOFOL N/A 04/25/2014   Dr. Oneida Alar: Multiple tubular adenomas removed. Diverticulosis. Moderate internal hemorrhoids. Next colonoscopy planned for August 2018.   CRANIOTOMY Bilateral 03/24/2022   Procedure: Bifrontal craniotomy for resection of meningioma;  Surgeon: Ashok Pall, MD;  Location: [place];  Service: Neurosurgery;  Laterality: Bilateral;   ESOPHAGOGASTRODUODENOSCOPY (EGD) WITH PROPOFOL N/A 06/19/2016   Procedure: ESOPHAGOGASTRODUODENOSCOPY (EGD) WITH PROPOFOL;  Surgeon: Daneil Dolin, MD;  [facility];  Service: Endoscopy;  Laterality: N/A;   MASTECTOMY W/ SENTINEL NODE BIOPSY Right 06/09/2018   Procedure: RIGHT TOTAL MASTECTOMY WITH SENTINEL LYMPH NODE BIOPSY;  Surgeon: Fanny Skates, MD;  Location: [place];  Service: General;  Laterality: Right;   POLYPECTOMY N/A 04/25/2014   Procedure: POLYPECTOMY;  Surgeon: Danie Binder, MD;  Location: [facility];  Service: Endoscopy;  Laterality: N/A;  Ascending and Decending Colon x3 , Transverse colon x2, rectal   PORTACATH PLACEMENT N/A 06/09/2018   Procedure: INSERTION PORT-A-CATH;  Surgeon: Fanny Skates, MD;  Location: [place];  Service: General;  Laterality: N/A;   RIGHT/LEFT HEART CATH AND CORONARY ANGIOGRAPHY N/A 07/10/2017   Procedure: RIGHT/LEFT HEART CATH AND CORONARY ANGIOGRAPHY;  Surgeon: Larey Dresser, MD;  Location: [place] [facility];  Service: Cardiovascular;  Laterality: N/A;   HPI:  Patient is a 76 y.o. female with PMH: HTN, HLD,  GERD, CKD stage IIIb, chronic HFpEF, meningioma, breast CA-s/p right lumpectomy and chemoradiation in 2008, depression/anxiety, hiatal hernia. She has h/o prolonged hospitalization from 6/24-9/16/2023 following craniotomy secondary to tumor with slow recovery, respiratory failure, lethargy, dysphagia. During that hospitalization, her swallow function improved to allow her to discharge on regular solids, thin liquids. Patient and spouse report that  she had a feeding tube (PEG) placed through [facility] 3-4 months ago and she has  not had any PO's since then. She presented to the hospital on 09/20/22 due to not tolerating tube feedings.    Assessment / Plan / Recommendation  Clinical Impression  Patient seen by SLP for bedside swallow evaluation but with primary focus on discussion/education as patient has been NPO for past few months due to esophageal dysphagia. MD entered room at end of this evaluation and reported that he did not in fact want an assessment of patient's ability to take PO's. During evaluation, SLP discussed dysphagia h/o with patient and her spouse. Most recent time she was seen by SLP at a [place] facility was 05/29/22 and at that time, after multiple MBS' she was able to be advanced to regular solids, thin liquids with precautions of taking small sips. Patient and spouse report that she was then discharged to rehab through [facility] and during that time, she had a PEG placed. Most recently she was receiving Home health therapy through [facility]. Patient reports that she was told by [facility] SLP to not have any food or drink by mouth except for ice chips but patient said, "I didn't want that" so she has reportedly had absolutely no PO's in past few months. Patient's NPO status and PEG appear to be secondary to her esophageal dysphagia with GERD and hiatal hernia, SLP not making any PO recommendations at this time. MBS on 05/28/2022 showed penetration of thin liquids but no aspiration even with large sips and recommendation at that time was for regular solids, thin liquids. SLP is recommending that GI be consulted to determine if she would be safe for any PO's, even if just for comfort. SLP to s/o at this time but please reorder if needed. SLP Visit Diagnosis: Dysphagia, pharyngoesophageal phase (R13.14)    Aspiration Risk  Mild aspiration risk    Diet Recommendation Other (Comment) (no PO recommendations to be made at this time)         Other  Recommendations Oral Care Recommendations: Oral care QID    Recommendations for follow up therapy are one component of a multi-disciplinary discharge planning process, led by the attending physician.  Recommendations may be updated based on patient status, additional functional criteria and insurance authorization.  Follow up Recommendations Follow physician's recommendations for discharge plan and follow up therapies      Assistance Recommended at Discharge    Functional Status Assessment Patient has not had a recent decline in their functional status  Frequency and Duration   N/A         Prognosis   N/A     Swallow Study   General Date of Onset: 09/20/22 HPI: Patient is a 76 y.o. female with PMH: HTN, HLD, GERD, CKD stage IIIb, chronic HFpEF, meningioma, breast CA-s/p right lumpectomy and chemoradiation in 2008, depression/anxiety, hiatal hernia. She has h/o prolonged hospitalization from 6/24-9/16/2023 following craniotomy secondary to tumor with slow recovery, respiratory failure, lethargy, dysphagia. During that hospitalization, her swallow function improved to allow her to discharge on regular solids, thin liquids. Patient and spouse report that she had  a feeding tube (PEG) placed through [facility] 3-4 months ago and she has  not had any PO's since then. She presented to the hospital on 09/20/22 due to not tolerating tube feedings. Type of Study: Bedside Swallow Evaluation Previous Swallow Assessment: during previous admission June/July of 2023, multiple MBS Diet Prior to this Study: NPO Temperature Spikes Noted: No Respiratory Status: Room air History of Recent Intubation: No Behavior/Cognition: Alert;Cooperative;Pleasant mood Oral Cavity Assessment: Within Functional Limits Oral Care Completed by SLP: No Vision: Functional for self-feeding Patient Positioning: Upright in bed Baseline Vocal Quality: Normal Volitional Cough: Strong Volitional Swallow: Able to  elicit    Oral/Motor/Sensory Function Overall Oral Motor/Sensory Function: Within functional limits   Ice Chips Ice chips: Not tested   Thin Liquid Thin Liquid: Not tested    Nectar Thick Nectar Thick Liquid: Not tested   Honey Thick Honey Thick Liquid: Not tested   Puree Puree: Not tested   Solid     Solid: Not tested     Sonia Baller, MA, CCC-SLP Speech Therapy

## 2022-09-20 NOTE — ED Provider Notes (Signed)
[facility] Provider Note  CSN: [id number] Arrival date & time: 09/20/22 0125  Chief Complaint(s) Shortness of Breath  HPI ALEKSIA FREIMAN is a 76 y.o. female with PMH breast cancer status post chemoradiation, nonischemic cardiomyopathy, CHF, A-fib on a DOAC, HTN, HLD, G-tube dependent, meningioma s/p resection and associated seizure disorder, recurrent aspiration pneumonia who presents emergency department for evaluation of shortness of breath and choking episodes with tube feeds.  History obtained from patient's husband who states that while aspiration pneumonia has been a problem for this patient it is significantly worsened over the last 2 to 3 days where she feels the sensation of choking and aspiration every time tube feeds are administered.  Has been states that the patient had a small seizure last night likely because he is having trouble giving her G-tube medications due to recurrent aspiration.  Patient currently denies chest pain, abdominal pain, nausea, vomiting or other systemic symptoms   Past Medical History 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   Anxiety    Breast cancer ([place]) 2008   right - s/p lumpectomy->chemo, radiation   Depression    Dysrhythmia    hx SVT   Family history of colon cancer    Family history of prostate cancer    GERD (gastroesophageal reflux disease)    H/O: hysterectomy    Hiatal hernia    History of cancer chemotherapy    History of radiation therapy    Hyperlipidemia    Hypertension 01/25/2018   Nonischemic cardiomyopathy ([place])    Personal history of radiation therapy 01/05/209   SVT (supraventricular tachycardia)    short RP SVT documented 2/02   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[place])    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)   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   Patient Active Problem  List   Diagnosis Date Noted   Status post insertion of percutaneous endoscopic gastrostomy (PEG) tube ([place]) 09/20/2022   Paroxysmal atrial fibrillation ([place]) 09/20/2022   Chronic anticoagulation 09/20/2022   Hiatal hernia 09/20/2022   Encounter for support and coordination of transition of care 08/23/2022   Excessive cerumen in ear canal, bilateral 08/23/2022   Aspiration pneumonia ([place]) 08/14/2022   Seizure disorder ([place])    Lethargy 05/15/2022   Dysfunction of right cardiac ventricle 05/07/2022   Acute postoperative anemia due to expected blood loss 05/07/2022   Dysphagia 05/07/2022   Palliative care encounter    Acute respiratory failure with hypoxia (HCC)    Aspiration into airway    Pressure injury of skin 04/07/2022   Meningioma ([place]) 03/24/2022   Seizure ([place]) 03/15/2022   Atrial fibrillation, chronic ([place]) 03/15/2022   Protein-calorie malnutrition, moderate ([place]) 03/15/2022   Atrial fibrillation with RVR (HCC)    Metabolic acidosis 54/27/0623   Acute renal failure superimposed on stage 3b chronic kidney disease ([place]) 02/27/2022   Anxiety and depression 02/27/2022   Meningioma, cerebral ([place]) 02/27/2022   Normocytic anemia 02/27/2022   Acute renal failure (ARF) ([place]) 02/27/2022   Hemorrhoids 01/04/2021   Ankle deformity, right 09/12/2020   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   Port-A-Cath in place 09/28/2018   Colon adenomas 08/06/2018   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 ([place]) 06/09/2018   Hypertension 01/25/2018   AKI (acute kidney injury) ([place]) 10/25/2016   Allergic  eosinophilia 10/07/2016   Acute on chronic combined systolic and diastolic CHF (congestive heart failure) ([place])    Demand ischemia    Hypokalemia 09/29/2016   Prolonged Q-T interval on ECG 09/29/2016   Chronic kidney disease (CKD), stage III (moderate) (HCC) 09/29/2016   Leukocytosis 09/18/2016   Abnormal CT scan, chest    Upper airway cough syndrome  vs Cough  variant asthma  04/09/2016   Chronic cough 03/17/2016   Seasonal allergies 08/08/2014   Family history of colon cancer 03/30/2014   Osteoporosis 05/39/7673   Metabolic syndrome X 41/93/7902   SVT (supraventricular tachycardia) 40/97/3532   Chronic systolic CHF (congestive heart failure) ([place]) 03/14/2013   Vitamin D deficiency 07/23/2010   Morbid obesity ([place]) 07/04/2009   Malignant neoplasm of right breast ([place]) 12/13/2007   Hyperlipidemia 12/13/2007   Anxiety state 12/13/2007   Depression, major, single episode, in partial remission ([place]) 12/13/2007   GERD 12/13/2007   Home Medication(s) Prior to Admission medications   Medication Sig Start Date End Date Taking? Authorizing Provider  acetaminophen (TYLENOL) 500 MG tablet Take 500-1,000 mg by mouth every 6 (six) hours as needed (FOR PAIN.).    [provider]  amiodarone (PACERONE) 200 MG tablet Take 1 tablet (200 mg total) by mouth daily. 08/22/22   [name], MD  Cholecalciferol (VITAMIN D) 50 MCG (2000 UT) CAPS Take 2,000 Units by mouth daily.    [provider]  clonazePAM (KLONOPIN) 0.5 MG tablet Take 1 tablet (0.5 mg total) by mouth at bedtime. 08/20/22   [name], MD  docusate sodium (COLACE) 100 MG capsule Take 200 mg by mouth 2 (two) times daily as needed for mild constipation.    [provider]  doxycycline (VIBRA-TABS) 100 MG tablet Take 1 tablet (100 mg total) by mouth 2 (two) times daily. 08/28/22   [name], MD  ELIQUIS 5 MG TABS tablet TAKE 1 TABLET VIA G-TUBE 2 TIMES DAILY. 09/05/22   [name], MD  ipratropium-albuterol (DUONEB) 0.5-2.5 (3) MG/3ML SOLN USe 1 vial by nebulization every 6 (six) hours as needed for shortness of breath. 05/30/22 08/20/22  Arrien, Jimmy Picket, MD  ipratropium-albuterol (DUONEB) 0.5-2.5 (3) MG/3ML SOLN Take 3 mLs by nebulization every 6 (six) hours as needed. 08/22/22   [name], MD  ipratropium-albuterol (DUONEB)  0.5-2.5 (3) MG/3ML SOLN Take 3 mLs by nebulization every 6 (six) hours as needed. 08/28/22   [name], MD  lacosamide (VIMPAT) 10 MG/ML oral solution Take 200 mg by mouth 2 (two) times daily.    [provider]  levETIRAcetam (KEPPRA) 100 MG/ML solution TAKE 7.5 MLS VIA G-TUBE 2 TIMES DAILY. 09/09/22   [name], MD  montelukast (SINGULAIR) 10 MG tablet Take 1 tablet (10 mg total) by mouth at bedtime. 08/20/22   [name], MD  Nutritional Supplements (FEEDING SUPPLEMENT, JEVITY 1.5 CAL/FIBER,) LIQD Place 1,000 mLs into feeding tube daily. 08/22/22 10/21/22  [name], MD  pantoprazole (PROTONIX) 40 MG tablet TAKE (1) TABLET BY MOUTH TWICE DAILY. 08/20/22   [name], MD  Protein (FEEDING SUPPLEMENT, PROSOURCE TF20,) liquid Place 60 mLs into feeding tube daily. 08/22/22   [name], MD  rosuvastatin (CRESTOR) 20 MG tablet Take 1 tablet (20 mg total) by mouth daily. 08/20/22   [name], MD  sertraline (ZOLOFT) 25 MG tablet Take 1 tablet (25 mg total) by mouth daily. 01/20/22   [name], [place]  Med Name: Prosource-TF 20 Liquid Place 60 MLS into feeding tube daily. 09/05/22   [name], MD  UNABLE TO FIND Med Name: Gloves Size:Medium 09/05/22   [name], MD  UNABLE TO FIND Med Name: Disposable Bed Pads Size: Addison Naegeli 09/05/22   [name], MD  UNABLE TO FIND Med Name: Adult Pull up Briefs Size : L 09/05/22   [name], MD  Water For Irrigation, Sterile (FREE WATER) SOLN Place 100 mLs into feeding tube every 12 (twelve) hours. (Slowly) 08/18/22   Barton Dubois, MD                                                                                                                                    Past Surgical History Past Surgical History:  Procedure Laterality Date   ABDOMINAL HYSTERECTOMY  1994   fibroids,    BIOPSY  06/19/2016   Procedure: BIOPSY;  Surgeon: Daneil Dolin, MD;  [facility];  Service: Endoscopy;;  gastric duodenum   BREAST BIOPSY Right 12/16/2006   malignant   BREAST EXCISIONAL BIOPSY Right 2018   benign lumpectomy   BREAST LUMPECTOMY Right 02/2007   BREAST LUMPECTOMY WITH RADIOACTIVE SEED LOCALIZATION Right 07/29/2017   Procedure: RIGHT BREAST LUMPECTOMY WITH RADIOACTIVE SEED LOCALIZATION;  Surgeon: Fanny Skates, MD;  Location: [facility];  Service: General;  Laterality: Right;   BREAST SURGERY Right 2008   lumpectomy, cancer   CARDIAC CATHETERIZATION     CHOLECYSTECTOMY  1999   COLONOSCOPY  2008   Dr. Oneida Alar: multiple polyps. Path not available at time of visit.    COLONOSCOPY WITH PROPOFOL N/A 04/25/2014   Dr. Oneida Alar: Multiple tubular adenomas removed. Diverticulosis. Moderate internal hemorrhoids. Next colonoscopy planned for August 2018.   CRANIOTOMY Bilateral 03/24/2022   Procedure: Bifrontal craniotomy for resection of meningioma;  Surgeon: Ashok Pall, MD;  Location: [facility];  Service: Neurosurgery;  Laterality: Bilateral;   ESOPHAGOGASTRODUODENOSCOPY (EGD) WITH PROPOFOL N/A 06/19/2016   Procedure: ESOPHAGOGASTRODUODENOSCOPY (EGD) WITH PROPOFOL;  Surgeon: Daneil Dolin, MD;  [facility];  Service: Endoscopy;  Laterality: N/A;   MASTECTOMY W/ SENTINEL NODE BIOPSY Right 06/09/2018   Procedure: RIGHT TOTAL MASTECTOMY WITH SENTINEL LYMPH NODE BIOPSY;  Surgeon: Fanny Skates, MD;  Location: [facility];  Service: General;  Laterality: Right;   POLYPECTOMY N/A 04/25/2014   Procedure: POLYPECTOMY;  Surgeon: Danie Binder, MD;  Location: [facility];  Service: Endoscopy;  Laterality: N/A;  Ascending and Decending Colon x3 , Transverse colon x2, rectal   PORTACATH PLACEMENT N/A 06/09/2018   Procedure: INSERTION PORT-A-CATH;  Surgeon: Fanny Skates, MD;  Location: [facility];  Service: General;  Laterality: N/A;   RIGHT/LEFT HEART CATH AND CORONARY ANGIOGRAPHY N/A 07/10/2017   Procedure: RIGHT/LEFT HEART CATH AND CORONARY ANGIOGRAPHY;   Surgeon: Larey Dresser, MD;  Location: [facility];  Service: Cardiovascular;  Laterality: N/A;   Family  History Family History  Problem Relation Age of Onset   Colon cancer Father 48       died at age 33   Hypertension Sister    Heart disease Sister    Cancer Sister        unknown form   Dementia Mother    Stroke Mother 80       left hemiparesis   Cancer Brother 10       prostate   Cancer Paternal Aunt        NOS   Lung cancer Paternal Uncle    Other Paternal Uncle        lightning strike   Other Paternal Uncle        hit by train   Other Paternal 51        old age   49 Cousin        NOS pat first cousin   Cancer Cousin        NOS pat first cousin   Prostate cancer Cousin        pat first cousin    Social History Social History   Tobacco Use   Smoking status: Never   Smokeless tobacco: Never  Vaping Use   Vaping Use: Never used  Substance Use Topics   Alcohol use: No   Drug use: No   Allergies Aspirin, Lisinopril, and Sudafed [pseudoephedrine hcl]  Review of Systems Review of Systems  Respiratory:  Positive for cough and choking.     Physical Exam Vital Signs  I have reviewed the triage vital signs BP (!) 110/46 (BP Location: Right Arm)   Pulse 73   Temp (!) 97.3 F (36.3 C) (Oral)   Resp 18   Ht '4\' 9"'$  (1.448 m)   Wt 71.8 kg   SpO2 96%   BMI 34.25 kg/m   Physical Exam Vitals and nursing note reviewed.  Constitutional:      General: She is not in acute distress.    Appearance: She is well-developed.  HENT:     Head: Normocephalic and atraumatic.  Eyes:     Conjunctiva/sclera: Conjunctivae normal.  Cardiovascular:     Rate and Rhythm: Normal rate and regular rhythm.     Heart sounds: No murmur heard. Pulmonary:     Effort: Pulmonary effort is normal. No respiratory distress.     Breath sounds: Normal breath sounds.  Abdominal:     Palpations: Abdomen is soft.     Tenderness: There is no abdominal tenderness.   Musculoskeletal:        General: No swelling.     Cervical back: Neck supple.  Skin:    General: Skin is warm and dry.     Capillary Refill: Capillary refill takes less than 2 seconds.  Neurological:     Mental Status: She is alert.  Psychiatric:        Mood and Affect: Mood normal.     ED Results and Treatments Labs (all labs ordered are listed, but only abnormal results are displayed) Labs Reviewed  CBC WITH DIFFERENTIAL/PLATELET - Abnormal; Notable for the following components:      Result Value   WBC 14.4 (*)    Hemoglobin 10.9 (*)    HCT 34.7 (*)    MCH 25.3 (*)    RDW 16.6 (*)    Neutro Abs 11.5 (*)    All other components within normal limits  COMPREHENSIVE METABOLIC PANEL - Abnormal; Notable for the following components:   Glucose, Bld  114 (*)    BUN 27 (*)    Creatinine, Ser 1.16 (*)    Albumin 3.3 (*)    GFR, Estimated 49 (*)    All other components within normal limits  RESP PANEL BY RT-PCR (RSV, FLU A&B, COVID)  RVPGX2  BRAIN NATRIURETIC PEPTIDE  C-REACTIVE PROTEIN  PROCALCITONIN  URINALYSIS, ROUTINE W REFLEX MICROSCOPIC  CBC  COMPREHENSIVE METABOLIC PANEL  TROPONIN I (HIGH SENSITIVITY)                                                                                                                          Radiology DG ABDOMEN PEG TUBE LOCATION  Result Date: 09/20/2022 CLINICAL DATA:  Evaluate PEG tube. EXAM: ABDOMEN - 1 VIEW COMPARISON:  None Available. FINDINGS: Contrast injected into the patient's gastrostomy tube lies within the stomach. The tip of the tube overlies the mid-distal duodenum. No extraluminal contrast is identified. IMPRESSION: Injected contrast at the patient's gastrostomy/enteric tube lies within the stomach. No extraluminal contrast. The tip of the tube overlies the mid/distal duodenum. Electronically Signed   By: Margarette Canada M.D.   On: 09/20/2022 09:24   DG Chest 2 View  Result Date: 09/20/2022 CLINICAL DATA:  Shortness of breath  EXAM: CHEST - 2 VIEW COMPARISON:  08/13/2022 FINDINGS: Cardiac shadow is enlarged but stable. Calcified hilar and mediastinal nodes are again seen consistent with prior granulomatous disease. Lungs are well aerated bilaterally. No focal infiltrate is seen. No bony abnormality is noted. Postsurgical changes in the right axilla are again noted. IMPRESSION: Changes of prior granulomatous disease.  No acute abnormality noted. Electronically Signed   By: Inez Catalina M.D.   On: 09/20/2022 03:02    Pertinent labs & imaging results that were available during my care of the patient were reviewed by me and considered in my medical decision making (see MDM for details).  Medications Ordered in ED Medications  trimethobenzamide (TIGAN) injection 200 mg (has no administration in time range)  sodium chloride flush (NS) 0.9 % injection 3 mL (3 mLs Intravenous Not Given 09/20/22 0930)  acetaminophen (TYLENOL) tablet 650 mg (has no administration in time range)    Or  acetaminophen (TYLENOL) suppository 650 mg (has no administration in time range)  albuterol (PROVENTIL) (2.5 MG/3ML) 0.083% nebulizer solution 2.5 mg (has no administration in time range)  lipase/protease/amylase) (VIOKACE) tablets 20,880 Units (has no administration in time range)    And  sodium bicarbonate tablet 650 mg (has no administration in time range)  rosuvastatin (CRESTOR) tablet 20 mg (has no administration in time range)  apixaban (ELIQUIS) tablet 5 mg (has no administration in time range)  lacosamide (VIMPAT) tablet 200 mg (has no administration in time range)  levETIRAcetam (KEPPRA) 100 MG/ML solution 750 mg (has no administration in time range)  feeding supplement (PROSource TF20) liquid 60 mL (has no administration in time range)  montelukast (SINGULAIR) tablet 10 mg (has no administration in time range)  0.9 %  sodium chloride infusion (  Intravenous New Bag/Given 09/20/22 1629)  feeding supplement (JEVITY 1.5 CAL/FIBER) liquid  1,000 mL (has no administration in time range)  clonazePAM (KLONOPIN) 0.1 mg/mL oral suspension 0.5 mg (has no administration in time range)  sertraline (ZOLOFT) tablet 25 mg (has no administration in time range)  levETIRAcetam (KEPPRA) 750 mg in sodium chloride 0.9 % 100 mL IVPB (0 mg Intravenous Stopped 09/20/22 0809)  lactated ringers bolus 1,000 mL (0 mLs Intravenous Stopped 09/20/22 0930)  diatrizoate meglumine-sodium (GASTROGRAFIN) 66-10 % solution (  Given 09/20/22 0918)                                                                                                                                     Procedures Procedures  (including critical care time)  Medical Decision Making / ED Course   This patient presents to the ED for concern of choking episodes with G-tube feeds, this involves an extensive number of treatment options, and is a complaint that carries with it a high risk of complications and morbidity.  The differential diagnosis includes uncontrolled reflux, aspiration pneumonia, G-tube malpositioning, medication failure  MDM: Patient seen emerged part for evaluation of multiple complaints as described above.  Physical exam largely unremarkable.  Laboratory evaluation with a leukocytosis to 14.4, hemoglobin 10.9, creatinine 1.16, BUN 27, COVID and flu negative, chest x-ray unremarkable.  Patient will likely require hospital admission due to inability to tolerate her tube feeds with recurrent aspiration.  I do wonder if possible consultation with IR for GJ tube make sense in this scenario but will defer to hospitalist evaluation.  Seizure medications ordered IV and patient ultimately will require hospital admission.   Additional history obtained: -Additional history obtained from husband -External records from outside source obtained and reviewed including: Chart review including previous notes, labs, imaging, consultation notes   Lab Tests: -I ordered, reviewed, and  interpreted labs.   The pertinent results include:   Labs Reviewed  CBC WITH DIFFERENTIAL/PLATELET - Abnormal; Notable for the following components:      Result Value   WBC 14.4 (*)    Hemoglobin 10.9 (*)    HCT 34.7 (*)    MCH 25.3 (*)    RDW 16.6 (*)    Neutro Abs 11.5 (*)    All other components within normal limits  COMPREHENSIVE METABOLIC PANEL - Abnormal; Notable for the following components:   Glucose, Bld 114 (*)    BUN 27 (*)    Creatinine, Ser 1.16 (*)    Albumin 3.3 (*)    GFR, Estimated 49 (*)    All other components within normal limits  RESP PANEL BY RT-PCR (RSV, FLU A&B, COVID)  RVPGX2  BRAIN NATRIURETIC PEPTIDE  C-REACTIVE PROTEIN  PROCALCITONIN  URINALYSIS, ROUTINE W REFLEX MICROSCOPIC  CBC  COMPREHENSIVE METABOLIC PANEL  TROPONIN I (HIGH SENSITIVITY)      EKG   EKG Interpretation  Date/Time:  Saturday September 20 2022  06:56:35 EST Ventricular Rate:  76 PR Interval:  60 QRS Duration: 134 QT Interval:  527 QTC Calculation: 593 R Axis:   -33 Text Interpretation: Sinus rhythm Short PR interval Left ventricular hypertrophy Borderline T abnormalities, lateral leads Prolonged QT interval Confirmed by Dene Gentry [phone]) on 09/20/2022 7:12:02 AM         Imaging Studies ordered: I ordered imaging studies including chest x-ray I independently visualized and interpreted imaging. I agree with the radiologist interpretation   Medicines ordered and prescription drug management: Meds ordered this encounter  Medications   levETIRAcetam (KEPPRA) 750 mg in sodium chloride 0.9 % 100 mL IVPB   DISCONTD: lacosamide (VIMPAT) 200 mg in sodium chloride 0.9 % 25 mL IVPB   lactated ringers bolus 1,000 mL   diatrizoate meglumine-sodium (GASTROGRAFIN) 66-10 % solution    Souther, Juanita A: cabinet override   trimethobenzamide (TIGAN) injection 200 mg   sodium chloride flush (NS) 0.9 % injection 3 mL   DISCONTD: 0.9 %  sodium chloride infusion   OR Linked  Order Group    acetaminophen (TYLENOL) tablet 650 mg    acetaminophen (TYLENOL) suppository 650 mg   albuterol (PROVENTIL) (2.5 MG/3ML) 0.083% nebulizer solution 2.5 mg   AND Linked Order Group    lipase/protease/amylase) (VIOKACE) tablets 20,880 Units    sodium bicarbonate tablet 650 mg   rosuvastatin (CRESTOR) tablet 20 mg   apixaban (ELIQUIS) tablet 5 mg   DISCONTD: clonazePAM (KLONOPIN) tablet 0.5 mg   lacosamide (VIMPAT) tablet 200 mg   levETIRAcetam (KEPPRA) 100 MG/ML solution 750 mg   DISCONTD: feeding supplement (JEVITY 1.5 CAL/FIBER) liquid 1,000 mL   feeding supplement (PROSource TF20) liquid 60 mL   montelukast (SINGULAIR) tablet 10 mg   0.9 %  sodium chloride infusion   feeding supplement (JEVITY 1.5 CAL/FIBER) liquid 1,000 mL   clonazePAM (KLONOPIN) 0.1 mg/mL oral suspension 0.5 mg   sertraline (ZOLOFT) tablet 25 mg    -I have reviewed the patients home medicines and have made adjustments as needed  Critical interventions none    Cardiac Monitoring: The patient was maintained on a cardiac monitor.  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   Anxiety    Breast cancer ([place]) 2008   right - s/p lumpectomy->chemo, radiation   Depression    Dysrhythmia    hx SVT   Family history of colon cancer    Family history of prostate cancer    GERD (gastroesophageal reflux disease)    H/O: hysterectomy    Hiatal hernia    History of cancer chemotherapy    History of radiation therapy    Hyperlipidemia    Hypertension 01/25/2018    Nonischemic cardiomyopathy ([place])    Personal history of radiation therapy 01/05/209   SVT (supraventricular tachycardia)    short RP SVT documented 6/96   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

## 2022-09-21 DIAGNOSIS — R9431 Abnormal electrocardiogram [ECG] [EKG]: Secondary | ICD-10-CM | POA: Diagnosis not present

## 2022-09-21 DIAGNOSIS — N179 Acute kidney failure, unspecified: Secondary | ICD-10-CM | POA: Diagnosis not present

## 2022-09-21 DIAGNOSIS — R1310 Dysphagia, unspecified: Secondary | ICD-10-CM | POA: Diagnosis not present

## 2022-09-21 DIAGNOSIS — D72829 Elevated white blood cell count, unspecified: Secondary | ICD-10-CM | POA: Diagnosis not present

## 2022-09-21 LAB — COMPREHENSIVE METABOLIC PANEL
ALT: 14 U/L (ref 0–44)
AST: 20 U/L (ref 15–41)
Albumin: 2.6 g/dL — ABNORMAL LOW (ref 3.5–5.0)
Alkaline Phosphatase: 59 U/L (ref 38–126)
Anion gap: 6 (ref 5–15)
BUN: 15 mg/dL (ref 8–23)
CO2: 21 mmol/L — ABNORMAL LOW (ref 22–32)
Calcium: 8.4 mg/dL — ABNORMAL LOW (ref 8.9–10.3)
Chloride: 112 mmol/L — ABNORMAL HIGH (ref 98–111)
Creatinine, Ser: 0.91 mg/dL (ref 0.44–1.00)
GFR, Estimated: >60 mL/min (ref >60)
Glucose, Bld: 71 mg/dL (ref 70–99)
Potassium: 3.5 mmol/L (ref 3.5–5.1)
Sodium: 139 mmol/L (ref 135–145)
Total Bilirubin: 0.4 mg/dL (ref 0.3–1.2)
Total Protein: 6.2 g/dL — ABNORMAL LOW (ref 6.5–8.1)

## 2022-09-21 LAB — GLUCOSE, CAPILLARY
Glucose-Capillary: 142 mg/dL — ABNORMAL HIGH (ref 70–99)
Glucose-Capillary: 59 mg/dL — ABNORMAL LOW (ref 70–99)
Glucose-Capillary: 87 mg/dL (ref 70–99)
Glucose-Capillary: 93 mg/dL (ref 70–99)

## 2022-09-21 LAB — CBC
HCT: 29.8 % — ABNORMAL LOW (ref 36.0–46.0)
Hemoglobin: 9.7 g/dL — ABNORMAL LOW (ref 12.0–15.0)
MCH: 25.9 pg — ABNORMAL LOW (ref 26.0–34.0)
MCHC: 32.6 g/dL (ref 30.0–36.0)
MCV: 79.7 fL — ABNORMAL LOW (ref 80.0–100.0)
Platelets: 239 10*3/uL (ref 150–400)
RBC: 3.74 MIL/uL — ABNORMAL LOW (ref 3.87–5.11)
RDW: 16.4 % — ABNORMAL HIGH (ref 11.5–15.5)
WBC: 9.1 10*3/uL (ref 4.0–10.5)
nRBC: 0 % (ref 0.0–0.2)

## 2022-09-21 LAB — PHOSPHORUS: Phosphorus: 3.6 mg/dL (ref 2.5–4.6)

## 2022-09-21 LAB — MAGNESIUM: Magnesium: 1.8 mg/dL (ref 1.7–2.4)

## 2022-09-21 MED ORDER — DEXTROSE 50 % IV SOLN
INTRAVENOUS | Status: AC
  Administered 2022-09-21: 50 mL
  Filled 2022-09-21: qty 50

## 2022-09-21 MED ORDER — OSMOLITE 1.5 CAL PO LIQD
1000.0000 mL | ORAL | Status: DC
  Administered 2022-09-21: 1000 mL via JEJUNOSTOMY
  Filled 2022-09-21: qty 1185

## 2022-09-21 MED ORDER — FREE WATER
100.0000 mL | Freq: Every day | Status: DC
  Administered 2022-09-21 – 2022-09-22 (×6): 100 mL

## 2022-09-21 MED ORDER — VITAL HIGH PROTEIN PO LIQD: 1000.0000 mL | ORAL | Status: DC

## 2022-09-21 MED ORDER — LACOSAMIDE 50 MG PO TABS
200.0000 mg | ORAL_TABLET | Freq: Two times a day (BID) | ORAL | Status: DC
  Administered 2022-09-21 – 2022-09-22 (×2): 200 mg via JEJUNOSTOMY
  Filled 2022-09-21 (×3): qty 4

## 2022-09-21 MED ORDER — LEVETIRACETAM IN NACL 500 MG/100ML IV SOLN
500.0000 mg | Freq: Two times a day (BID) | INTRAVENOUS | Status: DC
  Administered 2022-09-21 – 2022-09-22 (×3): 500 mg via INTRAVENOUS
  Filled 2022-09-21 (×4): qty 100

## 2022-09-21 MED ORDER — ROSUVASTATIN CALCIUM 20 MG PO TABS
20.0000 mg | ORAL_TABLET | Freq: Every day | ORAL | Status: DC
  Administered 2022-09-22: 20 mg
  Filled 2022-09-21 (×2): qty 1

## 2022-09-21 MED ORDER — CLONAZEPAM 0.5 MG PO TABS
0.5000 mg | ORAL_TABLET | Freq: Every day | ORAL | Status: DC
  Administered 2022-09-21: 0.5 mg via JEJUNOSTOMY
  Filled 2022-09-21: qty 1

## 2022-09-21 NOTE — Progress Notes (Signed)
PT Cancellation Note  Patient Details Name: Tara [name] MRN: [id number] DOB: [birth year]   Cancelled Treatment:    Reason Eval/Treat Not Completed: Other (comment)  Refusing OOB, or sitting EOB, despite best efforts at education and encouragement; Husband and son present, and encouraged pt participation as well;   Took time to validate pt's frustration with the situation, and gain some rapport, with goal of increasing pt's participation, with little effect;   She is a retired [employer] scientist, and I'm hopeful that she'll be better able to participate when her confusion clears;   Roney Marion, PT  Acute Rehabilitation Services Office [phone]    Colletta [place] 09/21/2022, 5:01 PM

## 2022-09-21 NOTE — Progress Notes (Signed)
Hypoglycemic Event  CBG: 59  Treatment: Glucagon IM 1 mg  Symptoms: Hungry  Follow-up CBG: Time:1307 CBG Result:142  Possible Reasons for Event: Inadequate meal intake  Comments/MD notified:MD Pokhrel    Domenick Quebedeaux K Petra Sargeant 59

## 2022-09-21 NOTE — Progress Notes (Signed)
Received a call from bedside RN regarding the patient refusing to take her medications.  Presented at bedside.  The patient is alert and able to answer questions.  She refuses to take any medications until she speaks with the admitting physician or until she is provided with a list of the medications that she can or cannot take.    Switched p.o. Keppra to IV Keppra.   Time: 15 minutes

## 2022-09-21 NOTE — Progress Notes (Signed)
PROGRESS NOTE    Tara [name]  IWL:[id number] DOB: [birth year] DOA: 09/20/2022 PCP: [name], MD    Brief Narrative:  Tara [name] is a 76 y.o. female with past medical history of meningioma s/p resection 03/2019, history of breast cancer, hyperlipidemia, hypertension,systolic CHF, recurrent aspiration pneumonia, dysphagia s/p PEG, seizure disorder, anxiety, depression, large sliding hernia, and GERD presented to hospital with malfunction of her gastric tube.  Patient had been feeling gassy, coughing vomiting and was lethargic at home and has been having some coughing spells.  In the ED, patient was afebrile with mild tachypnea.  Lab was significant for leukocytosis.  Chest x-ray noted changes of prior granulomatous disease with no acute abnormality appreciated.  Influenza and COVID-19 screening were negative.  Patient has been given 1 L lactated Ringer's, Keppra 750 mg IV, and Vimpat 200 mg IV and was admitted hospital for further evaluation and treatment.  Assessment and plan.  S/p [place] tube/Dysphagia  Patient indicated no p.o. intake due to recurrent aspiration pneumonia.  Per the husband patient had been using her G-tube and not the j-tube at home but was not tolerating tube feeds.  Contrast test showed correct placement of PEG tube by IR. Nursing care order for all tube feeds through J-tube plan on giving certain medications due to the J-tube and others through the G-tube.  I had a prolonged discussion with multiple family members at bedside regarding the plan for initiating medications.  Patient has been refusing intake of medications and tube feeding until family came in at bedside.  Will start the patient on on tube feed see if she would tolerate to goal.  Dietary has been consulted for further adjustments as necessary.  Informed nursing staff to initiate tube feeding today and see if she can tolerate.   Leukocytosis Initial white cell at 14.4.  improved to 9.1 today.   Procalcitonin less than 0.10.  Chest x-ray noted chronic granulomatous disease.  Influenza, COVID-19, RSV was negative.  Urinalysis pending.   Acute kidney injury On admission creatinine 1.16 with BUN 27.  Baseline creatinine previously noted to be around 0.7.  Creatinine today after IV fluids at 0.9.   Meningioma s/p craniotomy Status postcraniotomy on 03/2022.  With residual weakness.-PT evaluation yesterday and pending..  Cough Acute on chronic.  Patient noted to have episodes of coughing spells after receiving medications and tube feeds.  Please secondary to hiatal hernia.  Procalcitonin negative.  Chest x-ray with chronic disease.  Continue aspiration precautions.  Paroxysmal atrial fibrillation on chronic anticoagulation Continue amiodarone and Eliquis.  On sinus rhythm.   Seizure disorder No acute issues.  Continue Klonopin and Keppra. Recommend outpatient follow-up with neurology.  Plan for liquid preparation of Keppra and Vimpat on discharge.   Prolonged QT interval QTc was 593 on admission.  Will try to minimize QTc prolonging medications.  Correct electrolytes.  Latest potassium was 3.5.   Sliding hiatal hernia GERD Hiatal hernia noted CT in the abdomen pelvis from 04/2022.  Aspiration precautions.  Protonix on hold currently.  Chronic systolic congestive heart failure Review of last 2D echocardiogram noted EF to be 40 to 45% with indeterminate diastolic parameters in 09/7406.  Continue daily intake and output charting, daily weights.  Appears to be compensated at this time.  Received some IV fluids for initial hypovolemia.   Hyperlipidemia -Continue Crestor   Anxiety depression -Continue Zoloft     DVT prophylaxis:  apixaban (ELIQUIS) tablet 5 mg   Code Status:  Code Status: DNR  Disposition: Home likely on 09/22/2021 if able to tolerate tube feeding. Status is: Observation The patient will require care spanning > 2 midnights and should be moved to inpatient  because: Hypoglycemia, tube feeding intolerance, need for reinitiating tube feedings/see clinical course   Family Communication: Spoke with patient's husband, son and multiple family members at bedside including on the phone.  Consultants:  Interventional radiology  Procedures:  Contrast examination of JG tube  Antimicrobials:  None  Anti-infectives (From admission, onward)    None       Subjective: Today, patient was seen and examined at bedside.  Denies any nausea, vomiting, fever, chills or rigor but has been refusing oral meds and tube feeding until family was at bedside.  After having a family discussion patient is willing to start medications and tube feeding.  Nursing staff reported hypoglycemia and received glucagon IM.  Objective: Vitals:   09/20/22 1656 09/20/22 2120 09/21/22 0530 09/21/22 1003  BP: (!) 110/46 (!) 116/55 (!) 107/38 (!) 111/49  Pulse: 73 72 74 68  Resp: '18 17 17 16  '$ Temp: (!) 97.3 F (36.3 C) 98.1 F (36.7 C) 98.6 F (37 C) 97.9 F (36.6 C)  TempSrc: Oral Oral  Oral  SpO2: 96% 92% 97% 93%  Weight:      Height:        Intake/Output Summary (Last 24 hours) at 09/21/2022 1524 Last data filed at 09/21/2022 0530 Gross per 24 hour  Intake 863.41 ml  Output 200 ml  Net 663.41 ml   Filed Weights   09/20/22 0134  Weight: 71.8 kg    Physical Examination: Body mass index is 34.25 kg/m.  General: Obese built, not in obvious distress, elderly female, easily gets irritated, HENT:   No scleral pallor or icterus noted. Oral mucosa is moist.  Large scalp healed scar. Chest:    Diminished breath sounds bilaterally. No crackles or wheezes.  CVS: S1 &S2 heard. No murmur.  Regular rate and rhythm. Abdomen: Soft, nontender, nondistended.  Bowel sounds are heard.  PEG tube in place. Extremities: No cyanosis, clubbing or edema.  Peripheral pulses are palpable. Psych: Alert, awake and Communicative, underlying dementia. CNS:  No cranial nerve deficits.   Power equal in all extremities.  Generalized weakness noted. Skin: Warm and dry.  No rashes noted.  Data Reviewed:   CBC: Recent Labs  Lab 09/20/22 0202 09/21/22 0319  WBC 14.4* 9.1  NEUTROABS 11.5*  --   HGB 10.9* 9.7*  HCT 34.7* 29.8*  MCV 80.5 79.7*  PLT 292 423    Basic Metabolic Panel: Recent Labs  Lab 09/20/22 0202 09/21/22 0319 09/21/22 1210  NA 137 139  --   K 3.6 3.5  --   CL 105 112*  --   CO2 22 21*  --   GLUCOSE 114* 71  --   BUN 27* 15  --   CREATININE 1.16* 0.91  --   CALCIUM 9.4 8.4*  --   MG  --   --  1.8  PHOS  --   --  3.6    Liver Function Tests: Recent Labs  Lab 09/20/22 0202 09/21/22 0319  AST 24 20  ALT 18 14  ALKPHOS 77 59  BILITOT 0.8 0.4  PROT 7.5 6.2*  ALBUMIN 3.3* 2.6*     Radiology Studies: DG ABDOMEN PEG TUBE LOCATION  Result Date: 09/20/2022 CLINICAL DATA:  Evaluate PEG tube. EXAM: ABDOMEN - 1 VIEW COMPARISON:  None Available. FINDINGS: Contrast injected  into the patient's gastrostomy tube lies within the stomach. The tip of the tube overlies the mid-distal duodenum. No extraluminal contrast is identified. IMPRESSION: Injected contrast at the patient's gastrostomy/enteric tube lies within the stomach. No extraluminal contrast. The tip of the tube overlies the mid/distal duodenum. Electronically Signed   By: Margarette Canada M.D.   On: 09/20/2022 09:24   DG Chest 2 View  Result Date: 09/20/2022 CLINICAL DATA:  Shortness of breath EXAM: CHEST - 2 VIEW COMPARISON:  08/13/2022 FINDINGS: Cardiac shadow is enlarged but stable. Calcified hilar and mediastinal nodes are again seen consistent with prior granulomatous disease. Lungs are well aerated bilaterally. No focal infiltrate is seen. No bony abnormality is noted. Postsurgical changes in the right axilla are again noted. IMPRESSION: Changes of prior granulomatous disease.  No acute abnormality noted. Electronically Signed   By: Inez Catalina M.D.   On: 09/20/2022 03:02      LOS: 0 days      Flora Lipps, MD [facility] Available via Epic secure chat 7am-7pm After these hours, please refer to coverage provider listed on [email or web address] 09/21/2022, 3:24 PM

## 2022-09-21 NOTE — Plan of Care (Signed)
  Problem: Education: Goal: Knowledge of General Education information will improve Description: Including pain rating scale, medication(s)/side effects and non-pharmacologic comfort measures Outcome: Completed/Met

## 2022-09-21 NOTE — Progress Notes (Addendum)
Initial Nutrition Assessment RD working remotely.  DOCUMENTATION CODES:   Obesity unspecified  INTERVENTION:  - ordered Osmolite 1.5 @ 65 ml/hr x18 hours/day (1600-0800) with 100 ml water six times daily via J-port.  - this regimen provides 1755 kcal, 73 grams protein, and 1491 ml water.  - will start with 35 ml/hr and increase every 18 hours of run time to 65 ml/hr.  - complete NFPE when feasible.  - ongoing education to patient and family on using J-port for tube feeding.   NUTRITION DIAGNOSIS:   Inadequate oral intake related to dysphagia, inability to eat as evidenced by NPO status.  GOAL:   Patient will meet greater than or equal to 90% of their needs  MONITOR:   TF tolerance, Labs, Weight trends, I & O's  REASON FOR ASSESSMENT:   Consult Enteral/tube feeding initiation and management  ASSESSMENT:   76 y.o. female with medical history of meningioma s/p resection 03/2019, breast cancer, HLD, HTN, non-ischemic myopathy, systolic CHF, recurrent aspiration PNA, dysphagia s/p PEG-J, seizure disorder, anxiety, depression, large sliding hernia, and GERD. She presented to the ED due to gastric tube dysfunction. At home, she receives medications and tube feeding through G-port (red) and nothing through J-port (yellow). Her husband reported that patient has been gassy, coughing, vomiting, and lethargic. Patient is unsure if coughing or tube feeding is what leads to vomiting.  Called room phone which rang x10 at which time RD ended the call. RD called patient's husband, Jeneen Rinks, on his cell phone. He shares that patient receives TwoCal HN via G-port (they were unaware that it was supposed to be via J-port). Patient typically has tube feeding on at night and off during the day, though he was unable to provide details on what time tube feeding is turned on and turned off and at which rate tube feeding is run.  Patient was seen by a [place] RD ~1 month ago at which time she was  receiving Jevity 1.5.  Weight yesterday was 158 lb and weight has been stable for the past 1 month. Weight on 05/06/22 was 164 lb. This indicates 6 lb (3.6%) weight loss in 4 months which is not significant for time frame. No information documented in the edema section of flow sheet.   Labs reviewed; Cl: 112 mmol/l, C: 8.4 mg/dl. Medications reviewed.    NUTRITION - FOCUSED PHYSICAL EXAM:  RD working remotely.  Diet Order:   Diet Order             Diet NPO time specified  Diet effective now                   EDUCATION NEEDS:   No education needs have been identified at this time  Skin:  Skin Assessment: Reviewed RN Assessment  Last BM:  PTA/unknown  Height:   Ht Readings from Last 1 Encounters:  09/20/22 '4\' 9"'$  (1.448 m)    Weight:   Wt Readings from Last 1 Encounters:  09/20/22 71.8 kg    BMI:  Body mass index is 34.25 kg/m.  Estimated Nutritional Needs:  Kcal:  1600-1800 kcal Protein:  70-85 grams Fluid:  >/= 1.7 L/day      Jarome Matin, MS, RD, LDN, CNSC Clinical Dietitian PRN/Relief staff On-call/weekend pager # available in [facility]

## 2022-09-21 NOTE — Progress Notes (Signed)
Patient refused all her medications MD is aware, states that he is on the way to see patient.

## 2022-09-21 NOTE — Hospital Course (Signed)
KENNADIE BRENNER is a 76 y.o. female with past medical history of meningioma s/p resection 03/2019, history of breast cancer, hyperlipidemia, hypertension,systolic CHF, recurrent aspiration pneumonia, dysphagia s/p PEG, seizure disorder, anxiety, depression, large sliding hernia, and GERD presented to hospital with malfunction of her gastric tube.  Patient had been feeling gassy coughing vomiting and was lethargic at home and has been having some coughing spells.  In the ED patient was afebrile with mild tachypnea.  Lab was significant for leukocytosis.  Chest x-ray noted changes of prior granulomatous disease with no acute abnormality appreciated.  Influenza and COVID-19 screening were negative.  Patient has been given 1 L lactated Ringer's, Keppra 750 mg IV, and Vimpat 200 mg IV and was admitted hospital for further evaluation and treatment.  Assessment and plan.  S/p [place] tube/Dysphagia  Patient indicated no p.o. intake due to recurrent aspiration pneumonia.  Per the husband patient had been using her G-tube and not the j-tube at home but was not tolerating tube feeds.  Contrast test showed correct placement of PEG tube by IR. Nursing care order for all tube feeds through J-tube plan on giving certain medications due to the J-tube and others through the G-tube.  On tube feed 50 mill per hour.  Dietary has been consulted for further adjustments as necessary.   Leukocytosis Initial white cell at 14.4.  improved to 9.1 today.  Procalcitonin less than 0.10.  Chest x-ray noted chronic granulomatous disease.  Influenza, COVID-19, RSV was negative.  Urinalysis pending.   Acute kidney injury On admission creatinine 1.16 with BUN 27.  Baseline creatinine previously noted to be around 0.7.  Creatinine today after IV fluids at 0.9.   Meningioma s/p craniotomy Status postcraniotomy on 03/2022.  With residual weakness.-PT evaluation.  Cough Acute on chronic.  Patient noted to have episodes of coughing spells after  receiving medications and tube feeds.  Please secondary to hiatal hernia.  Procalcitonin negative.  Chest x-ray with chronic disease.  Continue aspiration precautions.  Paroxysmal atrial fibrillation on chronic anticoagulation Continue amiodarone and Eliquis.  On sinus rhythm.   Seizure disorder No acute issues.  Continue Klonopin and Keppra. Recommend outpatient follow-up with neurology   Prolonged QT interval QTc was 593 on admission.  Will try to minimize QTc prolonging medications.  Correct electrolytes.  Latest potassium was 3.5.   Sliding hiatal hernia GERD Hiatal hernia noted CT in the abdomen pelvis from 04/2022.  Aspiration precautions.  Protonix on hold currently.  Chronic systolic congestive heart failure Review of last 2D echocardiogram noted EF to be 40 to 45% with indeterminate diastolic parameters in 05/6758.  Continue daily intake and output charting, daily weights.  Appears to be compensated at this time.  Received some IV fluids for initial hypovolemia.   Hyperlipidemia -Continue Crestor   Anxiety depression -Continue Zoloft

## 2022-09-21 NOTE — Plan of Care (Addendum)
Plan is for patient to receive: (Red)G-tube: Crestor, Eliquis, Zoloft, Singulair, amiodarone, pantoprazole suspension. (Yellow)J-tube: vimpat, keppra, clonazepam, and tube feeds yo minimize risk of N/V due to large hiatal hernia.  Patient will need to have the prescriptions written for Keppra and Vimpat in the liquid form and sent to her pharmacy.  Pharmacy notes that this should be covered due to it being a different formulation.  A copy of this information was given to the patient nurse to be given to the husband.  If this plan is modified an updated plan of this should be provided.

## 2022-09-21 NOTE — Progress Notes (Signed)
Per husband and son. They are asking to state that she is getting the medication and not tell her what the medication is, because of confusion.Thanks

## 2022-09-21 NOTE — Care Management Obs Status (Signed)
[place] NOTIFICATION   Patient Details  Name: Tara [name] MRN: [id number] Date of Birth: 10/19/1945   Medicare Observation Status Notification Given:  Yes    Bartholomew Crews, RN 09/21/2022, 4:27 PM

## 2022-09-22 DIAGNOSIS — R0602 Shortness of breath: Secondary | ICD-10-CM | POA: Diagnosis not present

## 2022-09-22 DIAGNOSIS — Z853 Personal history of malignant neoplasm of breast: Secondary | ICD-10-CM | POA: Diagnosis not present

## 2022-09-22 DIAGNOSIS — Z1152 Encounter for screening for COVID-19: Secondary | ICD-10-CM | POA: Diagnosis not present

## 2022-09-22 DIAGNOSIS — Z7901 Long term (current) use of anticoagulants: Secondary | ICD-10-CM | POA: Diagnosis not present

## 2022-09-22 DIAGNOSIS — R1310 Dysphagia, unspecified: Secondary | ICD-10-CM | POA: Diagnosis not present

## 2022-09-22 DIAGNOSIS — N179 Acute kidney failure, unspecified: Secondary | ICD-10-CM | POA: Diagnosis not present

## 2022-09-22 DIAGNOSIS — R1314 Dysphagia, pharyngoesophageal phase: Secondary | ICD-10-CM | POA: Diagnosis not present

## 2022-09-22 DIAGNOSIS — D72829 Elevated white blood cell count, unspecified: Secondary | ICD-10-CM | POA: Diagnosis not present

## 2022-09-22 DIAGNOSIS — I480 Paroxysmal atrial fibrillation: Secondary | ICD-10-CM | POA: Diagnosis not present

## 2022-09-22 DIAGNOSIS — I4581 Long QT syndrome: Secondary | ICD-10-CM | POA: Diagnosis not present

## 2022-09-22 DIAGNOSIS — I5022 Chronic systolic (congestive) heart failure: Secondary | ICD-10-CM | POA: Diagnosis not present

## 2022-09-22 DIAGNOSIS — Z79899 Other long term (current) drug therapy: Secondary | ICD-10-CM | POA: Diagnosis not present

## 2022-09-22 DIAGNOSIS — I110 Hypertensive heart disease with heart failure: Secondary | ICD-10-CM | POA: Diagnosis not present

## 2022-09-22 DIAGNOSIS — Z931 Gastrostomy status: Secondary | ICD-10-CM | POA: Diagnosis not present

## 2022-09-22 LAB — BASIC METABOLIC PANEL
Anion gap: 10 (ref 5–15)
BUN: 11 mg/dL (ref 8–23)
CO2: 21 mmol/L — ABNORMAL LOW (ref 22–32)
Calcium: 8.4 mg/dL — ABNORMAL LOW (ref 8.9–10.3)
Chloride: 109 mmol/L (ref 98–111)
Creatinine, Ser: 0.86 mg/dL (ref 0.44–1.00)
GFR, Estimated: >60 mL/min (ref >60)
Glucose, Bld: 123 mg/dL — ABNORMAL HIGH (ref 70–99)
Potassium: 3.3 mmol/L — ABNORMAL LOW (ref 3.5–5.1)
Sodium: 140 mmol/L (ref 135–145)

## 2022-09-22 LAB — GLUCOSE, CAPILLARY
Glucose-Capillary: 105 mg/dL — ABNORMAL HIGH (ref 70–99)
Glucose-Capillary: 112 mg/dL — ABNORMAL HIGH (ref 70–99)
Glucose-Capillary: 114 mg/dL — ABNORMAL HIGH (ref 70–99)
Glucose-Capillary: 78 mg/dL (ref 70–99)
Glucose-Capillary: 91 mg/dL (ref 70–99)

## 2022-09-22 LAB — MAGNESIUM: Magnesium: 1.9 mg/dL (ref 1.7–2.4)

## 2022-09-22 LAB — CBC
HCT: 31.1 % — ABNORMAL LOW (ref 36.0–46.0)
Hemoglobin: 10 g/dL — ABNORMAL LOW (ref 12.0–15.0)
MCH: 25.6 pg — ABNORMAL LOW (ref 26.0–34.0)
MCHC: 32.2 g/dL (ref 30.0–36.0)
MCV: 79.5 fL — ABNORMAL LOW (ref 80.0–100.0)
Platelets: 242 10*3/uL (ref 150–400)
RBC: 3.91 MIL/uL (ref 3.87–5.11)
RDW: 16.5 % — ABNORMAL HIGH (ref 11.5–15.5)
WBC: 8.4 10*3/uL (ref 4.0–10.5)
nRBC: 0 % (ref 0.0–0.2)

## 2022-09-22 LAB — PHOSPHORUS: Phosphorus: 3.4 mg/dL (ref 2.5–4.6)

## 2022-09-22 MED ORDER — ROSUVASTATIN CALCIUM 20 MG PO TABS: 20.0000 mg | ORAL_TABLET | Freq: Every day | ORAL | Status: DC

## 2022-09-22 MED ORDER — POTASSIUM CHLORIDE 20 MEQ PO PACK
60.0000 meq | PACK | Freq: Once | ORAL | Status: AC
  Administered 2022-09-22: 60 meq
  Filled 2022-09-22: qty 3

## 2022-09-22 MED ORDER — DIPHENHYDRAMINE HCL 12.5 MG/5ML PO ELIX
6.2500 mg | ORAL_SOLUTION | Freq: Four times a day (QID) | ORAL | Status: DC | PRN
  Filled 2022-09-22: qty 5

## 2022-09-22 NOTE — Progress Notes (Signed)
PT Cancellation Note  Patient Details Name: Tara [name] MRN: [id number] DOB: 1946/09/10   Cancelled Treatment:    Reason Eval/Treat Not Completed: Patient declined, no reason specified Pt kept eyes closed during entire encounter stating she did not sleep well last night and is not interested in getting up today despite education on importance. Will follow.   Marguarite Arbour A Sayer Masini 09/22/2022, 11:47 AM Marisa Severin, PT, DPT Acute Rehabilitation Services Secure chat preferred Office [phone]

## 2022-09-22 NOTE — Progress Notes (Signed)
Tara [name] to be discharged Home per MD order. Discussed prescriptions and follow up appointments with the patient. Medication list explained in detail. Patient and husband verbalized understanding.  Skin clean, dry and intact without evidence of skin break down, no evidence of skin tears noted. IV catheter discontinued intact. Site without signs and symptoms of complications. Dressing and pressure applied. Pt denies pain at the site currently. No complaints noted.  Patient free of lines, drains, and wounds.   An After Visit Summary (AVS) was printed and given to the patient. Patient escorted via wheelchair, and discharged home via private auto.  Amaryllis Dyke, RN

## 2022-09-22 NOTE — Discharge Summary (Signed)
Physician Discharge Summary  Tara [name]:[id number] DOB: Jan 26, 1946 DOA: 09/20/2022  PCP: Tara Helper, MD  Admit date: 09/20/2022 Discharge date: 09/22/2022  Admitted From: Home  Discharge disposition: Home  Recommendations for Outpatient Follow-Up:   Follow up with your primary care provider in one week.  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  Diet recommendation: PEG tube feeding.  Wound care: None.  Code status: DNR   History of Present Illness:   Tara [name] is a 77 y.o. female with past medical history of meningioma s/p resection 03/2019, history of breast cancer, hyperlipidemia, hypertension,systolic CHF, recurrent aspiration pneumonia, dysphagia s/p PEG, seizure disorder, anxiety, depression, large sliding hernia, and GERD presented to hospital with malfunction of her gastric tube.  Patient had been feeling gassy, coughing vomiting and was lethargic at home and has been having some coughing spells.  In the ED, patient was afebrile with mild tachypnea.  Lab was significant for leukocytosis.  Chest x-ray noted changes of prior granulomatous disease with no acute abnormality appreciated.  Influenza and COVID-19 screening were negative.  Patient has been given 1 L lactated Ringer's, Keppra 750 mg IV, and Vimpat 200 mg IV and was admitted to the hospital for further evaluation and treatment.    Hospital Course:   Following conditions were  addressed during hospitalization as listed below,  S/p JG tube/Dysphagia  Patient indicated no p.o. intake due to recurrent aspiration pneumonia.  Per the husband patient had been using her G-tube and not the J-tube at home but was not tolerating tube feeds.  Contrast test showed correct placement of PEG tube by IR. Nursing care order for all tube feeds through J-tube plan on giving certain medications due to the J-tube and others through the G-tube.  I had a prolonged discussion with multiple family members at bedside regarding the plan for initiating medications.  She has been started on tube feeding which she has tolerated so far.  Leukocytosis Likely reactive.  Initial white cell at 14.4.  WBC today at 8.4.  Acute kidney injury On admission creatinine 1.16 with BUN 27.  Baseline creatinine previously noted to be around 0.7.  Creatinine today after IV fluids at 0.8.   Meningioma s/p craniotomy Status postcraniotomy on 03/2022.  With residual weakness.-Physical therapy consulted but patient has refused.  Cough Acute on chronic.  Patient noted to have episodes of coughing spells after receiving medications and tube feeds.    Procalcitonin negative.  Chest x-ray with chronic disease.  Continue aspiration precautions.   Paroxysmal atrial fibrillation on chronic anticoagulation Continue amiodarone and Eliquis.  On sinus rhythm.   Seizure disorder No acute issues.  Continue Klonopin and Keppra. Recommend outpatient follow-up with neurology.     Sliding hiatal hernia GERD Hiatal hernia noted. CT in the abdomen pelvis from 04/2022.  Aspiration precautions.     Chronic systolic congestive heart failure Review of last 2D echocardiogram noted EF to be 40 to 45% with  indeterminate diastolic parameters in 09/930.   Appears to be compensated at this time.    Hyperlipidemia -Continue Crestor   Anxiety depression -Continue Zoloft  Disposition.  At this time, patient is stable for disposition  home with outpatient PCP follow-up. Spoke with the patient's husband prior to discharge.  Medical Consultants:   IR  Procedures:    IR guided contrast examination of the PEG tube. Subjective:   Today, patient was seen and examined at bedside.  Closes her eyes.  Denies any nausea vomiting.  Has tolerated PEG tube feeding.  Discharge Exam:   Vitals:   09/22/22 0900 09/22/22 1343  BP: 130/65 (!) 108/54  Pulse: 70 82  Resp: 16 15  Temp: 98 F (36.7 C) 97.7 F (36.5 C)  SpO2: 92% 95%   Vitals:   09/22/22 0448 09/22/22 0617 09/22/22 0900 09/22/22 1343  BP: (!) 131/53  130/65 (!) 108/54  Pulse: 66  70 82  Resp: '18  16 15  '$ Temp: 97.9 F (36.6 C)  98 F (36.7 C) 97.7 F (36.5 C)  TempSrc: Oral  Oral Oral  SpO2: 96%  92% 95%  Weight:  72.3 kg    Height:       General: Alert awake, not in obvious distress, elderly female, Communicative, closes her eyes, obese female HENT: pupils equally reacting to light,  No scleral pallor or icterus noted. Oral mucosa is moist.  Large scar on the scalp. Chest:  Clear breath sounds.  Diminished breath sounds bilaterally. No crackles or wheezes.  CVS: S1 &S2 heard. No murmur.  Regular rate and rhythm. Abdomen: Soft, nontender, nondistended.  Bowel sounds are heard.  PEG tube in place. Extremities: No cyanosis, clubbing or edema.  Peripheral pulses are palpable. Psych: Alert, awake and oriented, normal mood CNS:  No cranial nerve deficits.  Power equal in all extremities.  Generalized weakness. Skin: Warm and dry.  No rashes noted.  The results of significant diagnostics from this hospitalization (including imaging, microbiology, ancillary and laboratory) are listed below for reference.     Diagnostic Studies:   DG ABDOMEN PEG TUBE LOCATION  Result Date: 09/20/2022 CLINICAL DATA:  Evaluate PEG tube. EXAM: ABDOMEN - 1 VIEW COMPARISON:  None Available. FINDINGS: Contrast injected into the patient's gastrostomy tube lies within the stomach.  The tip of the tube overlies the mid-distal duodenum. No extraluminal contrast is identified. IMPRESSION: Injected contrast at the patient's gastrostomy/enteric tube lies within the stomach. No extraluminal contrast. The tip of the tube overlies the mid/distal duodenum. Electronically Signed   By: Margarette Canada M.D.   On: 09/20/2022 09:24   DG Chest 2 View  Result Date: 09/20/2022 CLINICAL DATA:  Shortness of breath EXAM: CHEST - 2 VIEW COMPARISON:  08/13/2022 FINDINGS: Cardiac shadow is enlarged but stable. Calcified hilar and mediastinal nodes are again seen consistent with prior granulomatous disease. Lungs are well aerated bilaterally. No focal infiltrate is seen. No bony abnormality is noted. Postsurgical changes in the right axilla are again noted. IMPRESSION: Changes of prior granulomatous disease.  No acute abnormality noted. Electronically Signed   By: Inez Catalina M.D.   On: 09/20/2022 03:02     Labs:   Basic Metabolic Panel: Recent Labs  Lab 09/20/22 0202 09/21/22 0319 09/21/22 1210 09/22/22 0734  NA 137 139  --  140  K 3.6 3.5  --  3.3*  CL 105 112*  --  109  CO2 22 21*  --  21*  GLUCOSE 114* 71  --  123*  BUN 27* 15  --  11  CREATININE 1.16* 0.91  --  0.86  CALCIUM 9.4 8.4*  --  8.4*  MG  --   --  1.8 1.9  PHOS  --   --  3.6 3.4   GFR Estimated Creatinine Clearance: 45.8 mL/min (by C-G formula based on SCr of 0.86 mg/dL). Liver Function Tests: Recent Labs  Lab 09/20/22 0202 09/21/22 0319  AST 24 20  ALT 18 14  ALKPHOS 77 59  BILITOT 0.8 0.4  PROT 7.5 6.2*  ALBUMIN 3.3* 2.6*   No results for input(s): "LIPASE", "AMYLASE" in the last 168 hours. No results for input(s): "AMMONIA" in the last 168 hours. Coagulation profile No results for input(s): "INR", "PROTIME" in the last 168 hours.  CBC: Recent Labs  Lab 09/20/22 0202 09/21/22 0319 09/22/22 0734  WBC 14.4* 9.1 8.4  NEUTROABS 11.5*  --   --   HGB 10.9* 9.7* 10.0*  HCT 34.7* 29.8* 31.1*  MCV 80.5  79.7* 79.5*  PLT 292 239 242   Cardiac Enzymes: No results for input(s): "CKTOTAL", "CKMB", "CKMBINDEX", "TROPONINI" in the last 168 hours. BNP: Invalid input(s): "POCBNP" CBG: Recent Labs  Lab 09/21/22 2041 09/22/22 0056 09/22/22 0426 09/22/22 0737 09/22/22 1232  GLUCAP 93 105* 112* 114* 91   D-Dimer No results for input(s): "DDIMER" in the last 72 hours. Hgb A1c No results for input(s): "HGBA1C" in the last 72 hours. Lipid Profile No results for input(s): "CHOL", "HDL", "LDLCALC", "TRIG", "CHOLHDL", "LDLDIRECT" in the last 72 hours. Thyroid function studies No results for input(s): "TSH", "T4TOTAL", "T3FREE", "THYROIDAB" in the last 72 hours.  Invalid input(s): "FREET3" Anemia work up No results for input(s): "VITAMINB12", "FOLATE", "FERRITIN", "TIBC", "IRON", "RETICCTPCT" in the last 72 hours. Microbiology Recent Results (from the past 240 hour(s))  Resp panel by RT-PCR (RSV, Flu A&B, Covid) Anterior Nasal Swab     Status: None   Collection Time: 09/20/22  1:37 AM   Specimen: Anterior Nasal Swab  Result Value Ref Range Status   SARS Coronavirus 2 by RT PCR NEGATIVE NEGATIVE Final    Comment: (NOTE) SARS-CoV-2 target nucleic acids are NOT DETECTED.  The SARS-CoV-2 RNA is generally detectable in upper respiratory specimens during the acute phase of infection. The lowest concentration of SARS-CoV-2 viral copies this assay can detect is 138 copies/mL. A negative result does not preclude SARS-Cov-2 infection and should not be used as the sole basis for treatment or other patient management decisions. A negative result may occur with  improper specimen collection/handling, submission of specimen other than nasopharyngeal swab, presence of viral mutation(s) within the areas targeted by this assay, and inadequate number of viral copies(<138 copies/mL). A negative result must be combined with clinical observations, patient history, and epidemiological information. The  expected result is Negative.  Fact Sheet for Patients:  EntrepreneurPulse.com.au  Fact Sheet for Healthcare Providers:  IncredibleEmployment.be  This test is no t yet approved or cleared by the [place] FDA and  has been authorized for detection and/or diagnosis of SARS-CoV-2 by FDA under an Emergency Use Authorization (EUA). This EUA will remain  in effect (meaning this test can be used) for the duration of the COVID-19 declaration under Section 564(b)(1) of the Act, 21 U.S.C.section 360bbb-3(b)(1), unless the authorization is terminated  or revoked sooner.       Influenza A by PCR NEGATIVE NEGATIVE Final   Influenza B by PCR NEGATIVE NEGATIVE Final    Comment: (NOTE) The Xpert Xpress SARS-CoV-2/FLU/RSV  plus assay is intended as an aid in the diagnosis of influenza from Nasopharyngeal swab specimens and should not be used as a sole basis for treatment. Nasal washings and aspirates are unacceptable for Xpert Xpress SARS-CoV-2/FLU/RSV testing.  Fact Sheet for Patients: EntrepreneurPulse.com.au  Fact Sheet for Healthcare Providers: IncredibleEmployment.be  This test is not yet approved or cleared by the [place] FDA and has been authorized for detection and/or diagnosis of SARS-CoV-2 by FDA under an Emergency Use Authorization (EUA). This EUA will remain in effect (meaning this test can be used) for the duration of the COVID-19 declaration under Section 564(b)(1) of the Act, 21 U.S.C. section 360bbb-3(b)(1), unless the authorization is terminated or revoked.     Resp Syncytial Virus by PCR NEGATIVE NEGATIVE Final    Comment: (NOTE) Fact Sheet for Patients: EntrepreneurPulse.com.au  Fact Sheet for Healthcare Providers: IncredibleEmployment.be  This test is not yet approved or cleared by the [place] FDA and has been authorized for detection and/or  diagnosis of SARS-CoV-2 by FDA under an Emergency Use Authorization (EUA). This EUA will remain in effect (meaning this test can be used) for the duration of the COVID-19 declaration under Section 564(b)(1) of the Act, 21 U.S.C. section 360bbb-3(b)(1), unless the authorization is terminated or revoked.  Performed at [facility], [place] [address], [place], [place] [address]      Discharge Instructions:   Discharge Instructions     Ambulatory referral to Neurology   Complete by: As directed    An appointment is requested in approximately: 2 weeks   Diet - low sodium heart healthy   Complete by: As directed    Discharge instructions   Complete by: As directed    Follow-up with your primary care provider in 1 week.  Continue to take medications as prescribed.  Seek medical attention for worsening symptoms. Plan is for patient to receive: (Red)G-tube: Crestor, Eliquis, Zoloft, Singulair, amiodarone, pantoprazole suspension. (Yellow)J-tube: vimpat, keppra, clonazepam, and tube feeds yo minimize risk of N/V due to large hiatal hernia.   Increase activity slowly   Complete by: As directed       Allergies as of 09/22/2022       Reactions   Aspirin Other (See Comments)   Reports GI bleed--pt is currently taking   Lisinopril Cough   Sudafed [pseudoephedrine Hcl]    Pt reports she had drainage in throat that made her throat hurt.         Medication List     STOP taking these medications    docusate sodium 100 MG capsule Commonly known as: COLACE   doxycycline 100 MG tablet Commonly known as: VIBRA-TABS   sertraline 25 MG tablet Commonly known as: ZOLOFT       TAKE these medications    acetaminophen 500 MG tablet Commonly known as: TYLENOL Place 500-1,000 mg into feeding tube every 6 (six) hours as needed for mild pain.   amiodarone 200 MG tablet Commonly known as: PACERONE Take 1 tablet (200 mg total) by mouth daily. What changed: how to take this    clonazePAM 0.5 MG tablet Commonly known as: KLONOPIN Take 1 tablet (0.5 mg total) by mouth at bedtime.   Eliquis 5 MG Tabs tablet Generic drug: apixaban TAKE 1 TABLET VIA G-TUBE 2 TIMES DAILY. What changed: See the new instructions.   feeding supplement (JEVITY 1.5 CAL/FIBER) Liqd Place 1,000 mLs into feeding tube daily. What changed: how to take this   feeding supplement (PROSource TF20) liquid Place 60  mLs into feeding tube daily.   free water Soln Place 100 mLs into feeding tube every 12 (twelve) hours. (Slowly)   ipratropium-albuterol 0.5-2.5 (3) MG/3ML Soln Commonly known as: DUONEB Take 3 mLs by nebulization every 6 (six) hours as needed. What changed:  reasons to take this Another medication with the same name was removed. Continue taking this medication, and follow the directions you see here.   levETIRAcetam 100 MG/ML solution Commonly known as: KEPPRA TAKE 7.5 MLS VIA G-TUBE 2 TIMES DAILY. What changed: See the new instructions.   montelukast 10 MG tablet Commonly known as: SINGULAIR Take 1 tablet (10 mg total) by mouth at bedtime.   pantoprazole 40 MG tablet Commonly known as: PROTONIX TAKE (1) TABLET BY MOUTH TWICE DAILY. What changed:  how much to take how to take this when to take this additional instructions   rosuvastatin 20 MG tablet Commonly known as: Crestor Place 1 tablet (20 mg total) into feeding tube daily.   UNABLE TO FIND Med Name: Prosource-TF 20 Liquid Place 60 MLS into feeding tube daily.   UNABLE TO FIND Med Name: Gloves Size:Medium   UNABLE TO FIND Med Name: Disposable Bed Pads Size: XXL   UNABLE TO FIND Med Name: Adult Pull up Briefs Size : L   Vimpat 10 MG/ML oral solution Generic drug: lacosamide Take 200 mg by mouth 2 (two) times daily.   Vitamin D 50 MCG (2000 UT) Caps Place 2,000 Units into feeding tube daily.          Time coordinating discharge: 39 minutes  Signed:  Emori Mumme  [facility] 09/22/2022, 3:08 PM

## 2022-09-22 NOTE — Progress Notes (Signed)
  Transition of Care [facility]) Screening Note   Patient Details  Name: Tara [name] Date of Birth: [birth year]   Transition of Care [facility]) CM/SW Contact:    Cyndi Bender, RN Phone Number: 09/22/2022, 8:54 AM    Transition of Care Department [facility]) has reviewed patient and no TOC needs have been identified at this time. We will continue to monitor patient advancement through interdisciplinary progression rounds. If new patient transition needs arise, please place a TOC consult.

## 2022-09-23 ENCOUNTER — Telehealth: Payer: Self-pay | Admitting: Family Medicine

## 2022-09-23 DIAGNOSIS — G40909 Epilepsy, unspecified, not intractable, without status epilepticus: Secondary | ICD-10-CM | POA: Diagnosis not present

## 2022-09-23 DIAGNOSIS — E669 Obesity, unspecified: Secondary | ICD-10-CM | POA: Diagnosis not present

## 2022-09-23 DIAGNOSIS — F32A Depression, unspecified: Secondary | ICD-10-CM | POA: Diagnosis not present

## 2022-09-23 DIAGNOSIS — I110 Hypertensive heart disease with heart failure: Secondary | ICD-10-CM | POA: Diagnosis not present

## 2022-09-23 DIAGNOSIS — J9601 Acute respiratory failure with hypoxia: Secondary | ICD-10-CM | POA: Diagnosis not present

## 2022-09-23 DIAGNOSIS — Z9181 History of falling: Secondary | ICD-10-CM | POA: Diagnosis not present

## 2022-09-23 DIAGNOSIS — Z7901 Long term (current) use of anticoagulants: Secondary | ICD-10-CM | POA: Diagnosis not present

## 2022-09-23 DIAGNOSIS — Z792 Long term (current) use of antibiotics: Secondary | ICD-10-CM | POA: Diagnosis not present

## 2022-09-23 DIAGNOSIS — I428 Other cardiomyopathies: Secondary | ICD-10-CM | POA: Diagnosis not present

## 2022-09-23 DIAGNOSIS — G7281 Critical illness myopathy: Secondary | ICD-10-CM | POA: Diagnosis not present

## 2022-09-23 DIAGNOSIS — Z431 Encounter for attention to gastrostomy: Secondary | ICD-10-CM | POA: Diagnosis not present

## 2022-09-23 DIAGNOSIS — I255 Ischemic cardiomyopathy: Secondary | ICD-10-CM | POA: Diagnosis not present

## 2022-09-23 DIAGNOSIS — J690 Pneumonitis due to inhalation of food and vomit: Secondary | ICD-10-CM | POA: Diagnosis not present

## 2022-09-23 DIAGNOSIS — Z6838 Body mass index (BMI) 38.0-38.9, adult: Secondary | ICD-10-CM | POA: Diagnosis not present

## 2022-09-23 DIAGNOSIS — I482 Chronic atrial fibrillation, unspecified: Secondary | ICD-10-CM | POA: Diagnosis not present

## 2022-09-23 DIAGNOSIS — R1319 Other dysphagia: Secondary | ICD-10-CM | POA: Diagnosis not present

## 2022-09-23 DIAGNOSIS — Z86011 Personal history of benign neoplasm of the brain: Secondary | ICD-10-CM | POA: Diagnosis not present

## 2022-09-23 DIAGNOSIS — K219 Gastro-esophageal reflux disease without esophagitis: Secondary | ICD-10-CM | POA: Diagnosis not present

## 2022-09-23 DIAGNOSIS — Z9011 Acquired absence of right breast and nipple: Secondary | ICD-10-CM | POA: Diagnosis not present

## 2022-09-23 DIAGNOSIS — E785 Hyperlipidemia, unspecified: Secondary | ICD-10-CM | POA: Diagnosis not present

## 2022-09-23 DIAGNOSIS — K449 Diaphragmatic hernia without obstruction or gangrene: Secondary | ICD-10-CM | POA: Diagnosis not present

## 2022-09-23 DIAGNOSIS — F419 Anxiety disorder, unspecified: Secondary | ICD-10-CM | POA: Diagnosis not present

## 2022-09-23 DIAGNOSIS — I5022 Chronic systolic (congestive) heart failure: Secondary | ICD-10-CM | POA: Diagnosis not present

## 2022-09-23 DIAGNOSIS — Z853 Personal history of malignant neoplasm of breast: Secondary | ICD-10-CM | POA: Diagnosis not present

## 2022-09-23 DIAGNOSIS — D649 Anemia, unspecified: Secondary | ICD-10-CM | POA: Diagnosis not present

## 2022-09-23 NOTE — Telephone Encounter (Signed)
[facility], [phone]  Called stating pt is relying on J-tube for all nutrition & hydration. Wants to know if a modified barium swallow study done at [facility]?   Fax # [phone]

## 2022-09-23 NOTE — Telephone Encounter (Signed)
Deirdre with [facility] is requesting another modified swallow study be faxed to [phone] since it has been months since the patients last study and she is 100% reliant on J-tube. Ok to order ?

## 2022-09-24 ENCOUNTER — Other Ambulatory Visit: Payer: Self-pay

## 2022-09-24 ENCOUNTER — Telehealth: Payer: Self-pay

## 2022-09-24 ENCOUNTER — Telehealth (HOSPITAL_COMMUNITY): Payer: Self-pay

## 2022-09-24 DIAGNOSIS — Z931 Gastrostomy status: Secondary | ICD-10-CM

## 2022-09-24 NOTE — Telephone Encounter (Signed)
Transition Care Management Unsuccessful Follow-up Telephone Call  Date of discharge and from where:  [facility] 09/22/2022  Attempts:  1st Attempt  Reason for unsuccessful TCM follow-up call:  No answer/busy Juanda Crumble, [place] Direct Dial [phone]

## 2022-09-24 NOTE — Telephone Encounter (Signed)
Order faxed to number provided

## 2022-09-24 NOTE — Telephone Encounter (Signed)
Called and spoke with husband of patient to schedule Modified Barium Swallow - patient would like to have test done at [facility]. I informed husband that the scheduler from [facility] would give him a call to schedule patient.  Faxed over order on 09/24/22.

## 2022-09-25 NOTE — Telephone Encounter (Signed)
Transition Care Management Unsuccessful Follow-up Telephone Call  Date of discharge and from where:  [facility] 09/22/2022  Attempts:  2nd Attempt  Reason for unsuccessful TCM follow-up call:  Left voice message Juanda Crumble, [place] Direct Dial [phone]

## 2022-09-26 DIAGNOSIS — D649 Anemia, unspecified: Secondary | ICD-10-CM | POA: Diagnosis not present

## 2022-09-26 DIAGNOSIS — J9601 Acute respiratory failure with hypoxia: Secondary | ICD-10-CM | POA: Diagnosis not present

## 2022-09-26 DIAGNOSIS — G7281 Critical illness myopathy: Secondary | ICD-10-CM | POA: Diagnosis not present

## 2022-09-26 DIAGNOSIS — J690 Pneumonitis due to inhalation of food and vomit: Secondary | ICD-10-CM | POA: Diagnosis not present

## 2022-09-26 DIAGNOSIS — F32A Depression, unspecified: Secondary | ICD-10-CM | POA: Diagnosis not present

## 2022-09-26 DIAGNOSIS — K219 Gastro-esophageal reflux disease without esophagitis: Secondary | ICD-10-CM | POA: Diagnosis not present

## 2022-09-26 DIAGNOSIS — K449 Diaphragmatic hernia without obstruction or gangrene: Secondary | ICD-10-CM | POA: Diagnosis not present

## 2022-09-26 DIAGNOSIS — I110 Hypertensive heart disease with heart failure: Secondary | ICD-10-CM | POA: Diagnosis not present

## 2022-09-26 DIAGNOSIS — E785 Hyperlipidemia, unspecified: Secondary | ICD-10-CM | POA: Diagnosis not present

## 2022-09-26 DIAGNOSIS — G40909 Epilepsy, unspecified, not intractable, without status epilepticus: Secondary | ICD-10-CM | POA: Diagnosis not present

## 2022-09-26 DIAGNOSIS — I428 Other cardiomyopathies: Secondary | ICD-10-CM | POA: Diagnosis not present

## 2022-09-26 DIAGNOSIS — R1319 Other dysphagia: Secondary | ICD-10-CM | POA: Diagnosis not present

## 2022-09-26 DIAGNOSIS — I482 Chronic atrial fibrillation, unspecified: Secondary | ICD-10-CM | POA: Diagnosis not present

## 2022-09-26 DIAGNOSIS — I255 Ischemic cardiomyopathy: Secondary | ICD-10-CM | POA: Diagnosis not present

## 2022-09-26 DIAGNOSIS — I5022 Chronic systolic (congestive) heart failure: Secondary | ICD-10-CM | POA: Diagnosis not present

## 2022-09-26 DIAGNOSIS — F419 Anxiety disorder, unspecified: Secondary | ICD-10-CM | POA: Diagnosis not present

## 2022-09-26 NOTE — Telephone Encounter (Signed)
Transition Care Management Unsuccessful Follow-up Telephone Call  Date of discharge and from where:  [facility] 09/22/2022  Attempts:  3rd Attempt  Reason for unsuccessful TCM follow-up call:  Left voice message Juanda Crumble, [place] Direct Dial [phone]

## 2022-09-29 ENCOUNTER — Telehealth: Payer: Self-pay | Admitting: Family Medicine

## 2022-09-29 DIAGNOSIS — J9601 Acute respiratory failure with hypoxia: Secondary | ICD-10-CM | POA: Diagnosis not present

## 2022-09-29 DIAGNOSIS — I255 Ischemic cardiomyopathy: Secondary | ICD-10-CM | POA: Diagnosis not present

## 2022-09-29 DIAGNOSIS — J690 Pneumonitis due to inhalation of food and vomit: Secondary | ICD-10-CM | POA: Diagnosis not present

## 2022-09-29 DIAGNOSIS — I428 Other cardiomyopathies: Secondary | ICD-10-CM | POA: Diagnosis not present

## 2022-09-29 DIAGNOSIS — R1319 Other dysphagia: Secondary | ICD-10-CM | POA: Diagnosis not present

## 2022-09-29 DIAGNOSIS — F32A Depression, unspecified: Secondary | ICD-10-CM | POA: Diagnosis not present

## 2022-09-29 DIAGNOSIS — D649 Anemia, unspecified: Secondary | ICD-10-CM | POA: Diagnosis not present

## 2022-09-29 DIAGNOSIS — E785 Hyperlipidemia, unspecified: Secondary | ICD-10-CM | POA: Diagnosis not present

## 2022-09-29 DIAGNOSIS — K449 Diaphragmatic hernia without obstruction or gangrene: Secondary | ICD-10-CM | POA: Diagnosis not present

## 2022-09-29 DIAGNOSIS — K219 Gastro-esophageal reflux disease without esophagitis: Secondary | ICD-10-CM | POA: Diagnosis not present

## 2022-09-29 DIAGNOSIS — G40909 Epilepsy, unspecified, not intractable, without status epilepticus: Secondary | ICD-10-CM | POA: Diagnosis not present

## 2022-09-29 DIAGNOSIS — G7281 Critical illness myopathy: Secondary | ICD-10-CM | POA: Diagnosis not present

## 2022-09-29 DIAGNOSIS — I5022 Chronic systolic (congestive) heart failure: Secondary | ICD-10-CM | POA: Diagnosis not present

## 2022-09-29 DIAGNOSIS — F419 Anxiety disorder, unspecified: Secondary | ICD-10-CM | POA: Diagnosis not present

## 2022-09-29 DIAGNOSIS — I482 Chronic atrial fibrillation, unspecified: Secondary | ICD-10-CM | POA: Diagnosis not present

## 2022-09-29 DIAGNOSIS — I110 Hypertensive heart disease with heart failure: Secondary | ICD-10-CM | POA: Diagnosis not present

## 2022-09-29 MED ORDER — DOCUSATE SODIUM 50 MG/5ML PO LIQD: ORAL | 1 refills | Status: DC

## 2022-09-29 MED ORDER — POLYETHYLENE GLYCOL 3350 17 GM/SCOOP PO POWD: ORAL | 1 refills | Status: DC

## 2022-09-29 NOTE — Telephone Encounter (Signed)
Tara [name], Tara [name], [place] stating pt is having constipation & wants to know if she can be started like cholace, miralax? Pt is NPO, has to be given thru feeding tube.    Wants info for barium swallow status?

## 2022-09-30 NOTE — Telephone Encounter (Signed)
Deirdre and patients husband aware medication sent to [facility]

## 2022-10-01 ENCOUNTER — Other Ambulatory Visit (HOSPITAL_COMMUNITY): Payer: Self-pay | Admitting: Specialist

## 2022-10-01 DIAGNOSIS — R1319 Other dysphagia: Secondary | ICD-10-CM | POA: Diagnosis not present

## 2022-10-01 DIAGNOSIS — G40909 Epilepsy, unspecified, not intractable, without status epilepticus: Secondary | ICD-10-CM | POA: Diagnosis not present

## 2022-10-01 DIAGNOSIS — J690 Pneumonitis due to inhalation of food and vomit: Secondary | ICD-10-CM | POA: Diagnosis not present

## 2022-10-01 DIAGNOSIS — R058 Other specified cough: Secondary | ICD-10-CM

## 2022-10-01 DIAGNOSIS — K2100 Gastro-esophageal reflux disease with esophagitis, without bleeding: Secondary | ICD-10-CM

## 2022-10-01 DIAGNOSIS — F32A Depression, unspecified: Secondary | ICD-10-CM | POA: Diagnosis not present

## 2022-10-01 DIAGNOSIS — R1312 Dysphagia, oropharyngeal phase: Secondary | ICD-10-CM

## 2022-10-01 DIAGNOSIS — I482 Chronic atrial fibrillation, unspecified: Secondary | ICD-10-CM | POA: Diagnosis not present

## 2022-10-01 DIAGNOSIS — I255 Ischemic cardiomyopathy: Secondary | ICD-10-CM | POA: Diagnosis not present

## 2022-10-01 DIAGNOSIS — I5022 Chronic systolic (congestive) heart failure: Secondary | ICD-10-CM | POA: Diagnosis not present

## 2022-10-01 DIAGNOSIS — G7281 Critical illness myopathy: Secondary | ICD-10-CM | POA: Diagnosis not present

## 2022-10-01 DIAGNOSIS — D649 Anemia, unspecified: Secondary | ICD-10-CM | POA: Diagnosis not present

## 2022-10-01 DIAGNOSIS — D329 Benign neoplasm of meninges, unspecified: Secondary | ICD-10-CM | POA: Diagnosis not present

## 2022-10-01 DIAGNOSIS — R1314 Dysphagia, pharyngoesophageal phase: Secondary | ICD-10-CM | POA: Diagnosis not present

## 2022-10-01 DIAGNOSIS — K449 Diaphragmatic hernia without obstruction or gangrene: Secondary | ICD-10-CM | POA: Diagnosis not present

## 2022-10-01 DIAGNOSIS — E440 Moderate protein-calorie malnutrition: Secondary | ICD-10-CM | POA: Diagnosis not present

## 2022-10-01 DIAGNOSIS — F419 Anxiety disorder, unspecified: Secondary | ICD-10-CM | POA: Diagnosis not present

## 2022-10-01 DIAGNOSIS — J9601 Acute respiratory failure with hypoxia: Secondary | ICD-10-CM | POA: Diagnosis not present

## 2022-10-01 DIAGNOSIS — K219 Gastro-esophageal reflux disease without esophagitis: Secondary | ICD-10-CM | POA: Diagnosis not present

## 2022-10-01 DIAGNOSIS — I110 Hypertensive heart disease with heart failure: Secondary | ICD-10-CM | POA: Diagnosis not present

## 2022-10-01 DIAGNOSIS — E785 Hyperlipidemia, unspecified: Secondary | ICD-10-CM | POA: Diagnosis not present

## 2022-10-01 DIAGNOSIS — I428 Other cardiomyopathies: Secondary | ICD-10-CM | POA: Diagnosis not present

## 2022-10-03 DIAGNOSIS — K449 Diaphragmatic hernia without obstruction or gangrene: Secondary | ICD-10-CM | POA: Diagnosis not present

## 2022-10-03 DIAGNOSIS — G40909 Epilepsy, unspecified, not intractable, without status epilepticus: Secondary | ICD-10-CM | POA: Diagnosis not present

## 2022-10-03 DIAGNOSIS — J9601 Acute respiratory failure with hypoxia: Secondary | ICD-10-CM | POA: Diagnosis not present

## 2022-10-03 DIAGNOSIS — I428 Other cardiomyopathies: Secondary | ICD-10-CM | POA: Diagnosis not present

## 2022-10-03 DIAGNOSIS — K219 Gastro-esophageal reflux disease without esophagitis: Secondary | ICD-10-CM | POA: Diagnosis not present

## 2022-10-03 DIAGNOSIS — G7281 Critical illness myopathy: Secondary | ICD-10-CM | POA: Diagnosis not present

## 2022-10-03 DIAGNOSIS — J690 Pneumonitis due to inhalation of food and vomit: Secondary | ICD-10-CM | POA: Diagnosis not present

## 2022-10-03 DIAGNOSIS — F32A Depression, unspecified: Secondary | ICD-10-CM | POA: Diagnosis not present

## 2022-10-03 DIAGNOSIS — F419 Anxiety disorder, unspecified: Secondary | ICD-10-CM | POA: Diagnosis not present

## 2022-10-03 DIAGNOSIS — I5022 Chronic systolic (congestive) heart failure: Secondary | ICD-10-CM | POA: Diagnosis not present

## 2022-10-03 DIAGNOSIS — R1319 Other dysphagia: Secondary | ICD-10-CM | POA: Diagnosis not present

## 2022-10-03 DIAGNOSIS — I110 Hypertensive heart disease with heart failure: Secondary | ICD-10-CM | POA: Diagnosis not present

## 2022-10-03 DIAGNOSIS — I482 Chronic atrial fibrillation, unspecified: Secondary | ICD-10-CM | POA: Diagnosis not present

## 2022-10-03 DIAGNOSIS — I255 Ischemic cardiomyopathy: Secondary | ICD-10-CM | POA: Diagnosis not present

## 2022-10-03 DIAGNOSIS — D649 Anemia, unspecified: Secondary | ICD-10-CM | POA: Diagnosis not present

## 2022-10-03 DIAGNOSIS — E785 Hyperlipidemia, unspecified: Secondary | ICD-10-CM | POA: Diagnosis not present

## 2022-10-04 DIAGNOSIS — I4891 Unspecified atrial fibrillation: Secondary | ICD-10-CM | POA: Diagnosis not present

## 2022-10-04 DIAGNOSIS — F419 Anxiety disorder, unspecified: Secondary | ICD-10-CM | POA: Diagnosis not present

## 2022-10-04 DIAGNOSIS — E872 Acidosis, unspecified: Secondary | ICD-10-CM | POA: Diagnosis not present

## 2022-10-04 DIAGNOSIS — N179 Acute kidney failure, unspecified: Secondary | ICD-10-CM | POA: Diagnosis not present

## 2022-10-06 ENCOUNTER — Other Ambulatory Visit (HOSPITAL_COMMUNITY): Payer: Self-pay | Admitting: Neurosurgery

## 2022-10-06 DIAGNOSIS — J690 Pneumonitis due to inhalation of food and vomit: Secondary | ICD-10-CM | POA: Diagnosis not present

## 2022-10-06 DIAGNOSIS — K449 Diaphragmatic hernia without obstruction or gangrene: Secondary | ICD-10-CM | POA: Diagnosis not present

## 2022-10-06 DIAGNOSIS — K219 Gastro-esophageal reflux disease without esophagitis: Secondary | ICD-10-CM | POA: Diagnosis not present

## 2022-10-06 DIAGNOSIS — G40909 Epilepsy, unspecified, not intractable, without status epilepticus: Secondary | ICD-10-CM | POA: Diagnosis not present

## 2022-10-06 DIAGNOSIS — I5022 Chronic systolic (congestive) heart failure: Secondary | ICD-10-CM | POA: Diagnosis not present

## 2022-10-06 DIAGNOSIS — E785 Hyperlipidemia, unspecified: Secondary | ICD-10-CM | POA: Diagnosis not present

## 2022-10-06 DIAGNOSIS — J9601 Acute respiratory failure with hypoxia: Secondary | ICD-10-CM | POA: Diagnosis not present

## 2022-10-06 DIAGNOSIS — I110 Hypertensive heart disease with heart failure: Secondary | ICD-10-CM | POA: Diagnosis not present

## 2022-10-06 DIAGNOSIS — D329 Benign neoplasm of meninges, unspecified: Secondary | ICD-10-CM

## 2022-10-06 DIAGNOSIS — I255 Ischemic cardiomyopathy: Secondary | ICD-10-CM | POA: Diagnosis not present

## 2022-10-06 DIAGNOSIS — I428 Other cardiomyopathies: Secondary | ICD-10-CM | POA: Diagnosis not present

## 2022-10-06 DIAGNOSIS — G7281 Critical illness myopathy: Secondary | ICD-10-CM | POA: Diagnosis not present

## 2022-10-06 DIAGNOSIS — F32A Depression, unspecified: Secondary | ICD-10-CM | POA: Diagnosis not present

## 2022-10-06 DIAGNOSIS — F419 Anxiety disorder, unspecified: Secondary | ICD-10-CM | POA: Diagnosis not present

## 2022-10-06 DIAGNOSIS — D649 Anemia, unspecified: Secondary | ICD-10-CM | POA: Diagnosis not present

## 2022-10-06 DIAGNOSIS — I482 Chronic atrial fibrillation, unspecified: Secondary | ICD-10-CM | POA: Diagnosis not present

## 2022-10-06 DIAGNOSIS — R1319 Other dysphagia: Secondary | ICD-10-CM | POA: Diagnosis not present

## 2022-10-08 ENCOUNTER — Other Ambulatory Visit: Payer: Self-pay | Admitting: Family Medicine

## 2022-10-08 DIAGNOSIS — G40909 Epilepsy, unspecified, not intractable, without status epilepticus: Secondary | ICD-10-CM | POA: Diagnosis not present

## 2022-10-08 DIAGNOSIS — K449 Diaphragmatic hernia without obstruction or gangrene: Secondary | ICD-10-CM | POA: Diagnosis not present

## 2022-10-08 DIAGNOSIS — E785 Hyperlipidemia, unspecified: Secondary | ICD-10-CM | POA: Diagnosis not present

## 2022-10-08 DIAGNOSIS — J9601 Acute respiratory failure with hypoxia: Secondary | ICD-10-CM | POA: Diagnosis not present

## 2022-10-08 DIAGNOSIS — I255 Ischemic cardiomyopathy: Secondary | ICD-10-CM | POA: Diagnosis not present

## 2022-10-08 DIAGNOSIS — G7281 Critical illness myopathy: Secondary | ICD-10-CM | POA: Diagnosis not present

## 2022-10-08 DIAGNOSIS — J690 Pneumonitis due to inhalation of food and vomit: Secondary | ICD-10-CM | POA: Diagnosis not present

## 2022-10-08 DIAGNOSIS — R1319 Other dysphagia: Secondary | ICD-10-CM | POA: Diagnosis not present

## 2022-10-08 DIAGNOSIS — I5022 Chronic systolic (congestive) heart failure: Secondary | ICD-10-CM | POA: Diagnosis not present

## 2022-10-08 DIAGNOSIS — F419 Anxiety disorder, unspecified: Secondary | ICD-10-CM | POA: Diagnosis not present

## 2022-10-08 DIAGNOSIS — D649 Anemia, unspecified: Secondary | ICD-10-CM | POA: Diagnosis not present

## 2022-10-08 DIAGNOSIS — F32A Depression, unspecified: Secondary | ICD-10-CM | POA: Diagnosis not present

## 2022-10-08 DIAGNOSIS — K219 Gastro-esophageal reflux disease without esophagitis: Secondary | ICD-10-CM | POA: Diagnosis not present

## 2022-10-08 DIAGNOSIS — I482 Chronic atrial fibrillation, unspecified: Secondary | ICD-10-CM | POA: Diagnosis not present

## 2022-10-08 DIAGNOSIS — I428 Other cardiomyopathies: Secondary | ICD-10-CM | POA: Diagnosis not present

## 2022-10-08 DIAGNOSIS — I110 Hypertensive heart disease with heart failure: Secondary | ICD-10-CM | POA: Diagnosis not present

## 2022-10-09 ENCOUNTER — Encounter (HOSPITAL_COMMUNITY): Payer: Self-pay | Admitting: Speech Pathology

## 2022-10-09 ENCOUNTER — Ambulatory Visit (HOSPITAL_COMMUNITY)
Admission: RE | Admit: 2022-10-09 | Discharge: 2022-10-09 | Disposition: A | Discharge status: Home / Self Care | Payer: Medicare Other | Source: Ambulatory Visit | Attending: Family Medicine | Admitting: Family Medicine

## 2022-10-09 ENCOUNTER — Ambulatory Visit (HOSPITAL_COMMUNITY): Payer: Medicare Other | Attending: Family Medicine | Admitting: Speech Pathology

## 2022-10-09 DIAGNOSIS — K2100 Gastro-esophageal reflux disease with esophagitis, without bleeding: Secondary | ICD-10-CM | POA: Diagnosis not present

## 2022-10-09 DIAGNOSIS — R1310 Dysphagia, unspecified: Secondary | ICD-10-CM | POA: Diagnosis not present

## 2022-10-09 DIAGNOSIS — R058 Other specified cough: Secondary | ICD-10-CM

## 2022-10-09 DIAGNOSIS — R1312 Dysphagia, oropharyngeal phase: Secondary | ICD-10-CM | POA: Insufficient documentation

## 2022-10-09 NOTE — Therapy (Signed)
[place], [place], [address] Phone: [phone]   Fax:  [phone]  Modified Barium Swallow  Patient Details  Name: Tara [name] MRN: [id number] Date of Birth: Nov 21, 1945 No data recorded  Encounter Date: 10/09/2022   End of Session - 10/09/22 1643     Visit Number 1    Number of Visits 1    Authorization Type BCBS Medicare    SLP Start Time 1200    SLP Stop Time  6962    SLP Time Calculation (min) 36 min    Activity Tolerance Patient tolerated treatment well             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   Anxiety    Breast cancer ([place]) 2008   right - s/p lumpectomy->chemo, radiation   Depression    Dysrhythmia    hx SVT   Family history of colon cancer    Family history of prostate cancer    GERD (gastroesophageal reflux disease)    H/O: hysterectomy    Hiatal hernia    History of cancer chemotherapy    History of radiation therapy    Hyperlipidemia    Hypertension 01/25/2018   Nonischemic cardiomyopathy ([place])    Personal history of radiation therapy 01/05/209   SVT (supraventricular tachycardia)    short RP SVT documented 9/52   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[place])    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)   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    Past Surgical History:  Procedure Laterality Date   ABDOMINAL HYSTERECTOMY  1994   fibroids,    BIOPSY  06/19/2016   Procedure: BIOPSY;  Surgeon: Daneil Dolin, MD;  [facility];  Service: Endoscopy;;  gastric duodenum   BREAST BIOPSY Right 12/16/2006   malignant   BREAST EXCISIONAL BIOPSY Right 2018   benign lumpectomy   BREAST LUMPECTOMY Right 02/2007   BREAST LUMPECTOMY WITH RADIOACTIVE SEED LOCALIZATION Right 07/29/2017   Procedure: RIGHT BREAST LUMPECTOMY WITH RADIOACTIVE SEED  LOCALIZATION;  Surgeon: Fanny Skates, MD;  Location: [facility];  Service: General;  Laterality: Right;   BREAST SURGERY Right 2008   lumpectomy, cancer   CARDIAC CATHETERIZATION     CHOLECYSTECTOMY  1999   COLONOSCOPY  2008   Dr. Oneida Alar: multiple polyps. Path not available at time of visit.    COLONOSCOPY WITH PROPOFOL N/A 04/25/2014   Dr. Oneida Alar: Multiple tubular adenomas removed. Diverticulosis. Moderate internal hemorrhoids. Next colonoscopy planned for August 2018.   CRANIOTOMY Bilateral 03/24/2022   Procedure: Bifrontal craniotomy for resection of meningioma;  Surgeon: Ashok Pall, MD;  Location: [facility];  Service: Neurosurgery;  Laterality: Bilateral;   ESOPHAGOGASTRODUODENOSCOPY (EGD) WITH PROPOFOL N/A 06/19/2016   Procedure: ESOPHAGOGASTRODUODENOSCOPY (EGD) WITH PROPOFOL;  Surgeon: Daneil Dolin, MD;  [facility];  Service: Endoscopy;  Laterality: N/A;   MASTECTOMY W/ SENTINEL NODE BIOPSY Right 06/09/2018   Procedure: RIGHT TOTAL MASTECTOMY WITH SENTINEL LYMPH NODE BIOPSY;  Surgeon: Fanny Skates, MD;  Location: [facility];  Service: General;  Laterality: Right;   POLYPECTOMY N/A 04/25/2014   Procedure: POLYPECTOMY;  Surgeon: Danie Binder, MD;  Location: [facility];  Service: Endoscopy;  Laterality: N/A;  Ascending and Decending Colon x3 , Transverse colon x2, rectal   PORTACATH PLACEMENT N/A 06/09/2018  Procedure: INSERTION PORT-A-CATH;  Surgeon: Fanny Skates, MD;  Location: [facility];  Service: General;  Laterality: N/A;   RIGHT/LEFT HEART CATH AND CORONARY ANGIOGRAPHY N/A 07/10/2017   Procedure: RIGHT/LEFT HEART CATH AND CORONARY ANGIOGRAPHY;  Surgeon: Larey Dresser, MD;  Location: [facility];  Service: Cardiovascular;  Laterality: N/A;    There were no vitals filed for this visit.        General - 10/09/22 1637       General Information   Date of Onset 09/20/22    HPI Patient is a 77 y.o. female with PMH: HTN, HLD, GERD, CKD stage IIIb, chronic HFpEF,  meningioma, breast CA-s/p right lumpectomy and chemoradiation in 2008, depression/anxiety, hiatal hernia. She has h/o prolonged hospitalization from 6/24-9/16/2023 following craniotomy secondary to tumor with slow recovery, respiratory failure, lethargy, dysphagia. During that hospitalization, her swallow function improved to allow her to discharge on regular solids, thin liquids. Patient and spouse report that she had a feeding tube (PEG) [facility] through [facility] around 07/06/22 and she has  not had any PO's since. This was apparently attributed to esophageal dysphagia. Pt also has a h/o constipation, per spouse.   Type of Study MBS-Modified Barium Swallow Study    Previous Swallow Assessment during previous admission June/July of 2023, multiple MBS with last on 05/28/22 with recommendation for regular/thin    Diet Prior to this Study NPO    Temperature Spikes Noted No    Respiratory Status Room air    History of Recent Intubation No    Behavior/Cognition Alert;Cooperative;Pleasant mood    Oral Cavity Assessment Within Functional Limits    Oral Care Completed by SLP Recent completion by patient/staff    Oral Cavity - Dentition Adequate natural dentition;Missing dentition    Vision Functional for self feeding    Self-Feeding Abilities Able to feed self    Patient Positioning Upright in chair    Baseline Vocal Quality Normal    Volitional Cough Strong    Volitional Swallow Able to elicit    Anatomy Within functional limits    Pharyngeal Secretions Not observed secondary MBS                Oral Preparation/Oral Phase - 10/09/22 1638       Oral Preparation/Oral Phase   Oral Phase Within functional limits              Pharyngeal Phase - 10/09/22 1638       Pharyngeal Phase   Pharyngeal Phase Impaired      Pharyngeal - Thin   Pharyngeal- Thin Teaspoon Swallow initiation at vallecula;Within functional limits    Pharyngeal- Thin Cup Within functional limits;Swallow  initiation at vallecula;Penetration/Aspiration during swallow    Pharyngeal Material enters airway, remains ABOVE vocal cords then ejected out    Pharyngeal- Thin Straw Swallow initiation at vallecula;Penetration/Aspiration during swallow    Pharyngeal Material enters airway, remains ABOVE vocal cords then ejected out      Pharyngeal - Solids   Pharyngeal- Puree Delayed swallow initiation;Pharyngeal residue - valleculae    Pharyngeal- Regular Delayed swallow initiation-vallecula    Pharyngeal- Pill Other (Comment)   trace penetration of thins when taking cup sip thin with pill, cued throat clear removed it     Electrical Stimulation - Pharyngeal Phase   Was Electrical Stimulation Used No              Cricopharyngeal Phase - 10/09/22 1641       Cervical Esophageal Phase  Cervical Esophageal Phase Impaired      Cervical Esophageal Phase - Thin   Thin Straw Prominent cricopharyngeal segment      Cervical Esophageal Phase - Comment   Other Esophageal Phase Observations Transient delay of barium tablet noted near GE junction and liquid retained above it with evidence of to and fro movement, however eventually cleared               Plan - 10/09/22 1644     Clinical Impression Statement MBSS completed and Pt assessed in the lateral position with barium tinged thin via tsp/cup/straw, puree, regular textures, and barium tablet. Pt presents with essentially WNL oropharyngeal swallow with swallow trigger at the level of the valleculae across textures and consistencies. Pt with flash penetration of thins (underepiglottic coating) during the swallow at times with only once in trace amount to the top of the vocal folds (removed with a cued cough) which occurred when taking sequential cup sips with the barium tablet. Pt with min lingual residuals after the primary swallow, however she independtly clears with a spontaneous and or volitional swallow. Pt with noted prominent cricopharyngeus  during sequential straw sips thin. Esophageal sweep was completed after all thins were presented (esophagus clear), after purees (clear) and then following regular textures and barium tablet and the barium tablet was noted to be delayed near the GE junction and barium backed up and moved in to and fro motion up to the thoracic esophagus, but did eventually clear. Pt could consume regular textures and thin liquids from and oropharyngeal perspective, however she has a history of esophageal dysphagia per previous reports, so may benefit from a GI consult and/or proceed with transition to oral diet with caution/care. She will also need to work with a registered dietician to transition off her tube feeds. Recommend D3/mech soft with thin liquids (taking care to cut up meats/solids and/or masticate thoroughly) via cup or straw when Pt is alert and upright.             Patient will benefit from skilled therapeutic intervention in order to improve the following deficits and impairments:   Dysphagia, oropharyngeal phase     Recommendations/Treatment - 10/09/22 1642       Swallow Evaluation Recommendations   Recommended Consults Consider esophageal assessment;Consider GI evaluation    SLP Diet Recommendations Thin;Dysphagia 3 (mechanical soft)    Liquid Administration via Cup;Straw    Medication Administration Whole meds with liquid   crush large pills as able and present in puree   Supervision Patient able to self feed    Compensations Small sips/bites;Slow rate    Postural Changes Seated upright at 90 degrees;Remain upright for at least 30 minutes after feeds/meals               Problem List Patient Active Problem List   Diagnosis Date Noted   Status post insertion of percutaneous endoscopic gastrostomy (PEG) tube ([place]) 09/20/2022   Paroxysmal atrial fibrillation ([place]) 09/20/2022   Chronic anticoagulation 09/20/2022   Hiatal hernia 09/20/2022   Encounter for support and coordination  of transition of care 08/23/2022   Excessive cerumen in ear canal, bilateral 08/23/2022   Aspiration pneumonia ([place]) 08/14/2022   Seizure disorder ([place])    Lethargy 05/15/2022   Dysfunction of right cardiac ventricle 05/07/2022   Acute postoperative anemia due to expected blood loss 05/07/2022   Dysphagia 05/07/2022   Palliative care encounter    Acute respiratory failure with hypoxia (HCC)    Aspiration into airway  Pressure injury of skin 04/07/2022   Meningioma ([place]) 03/24/2022   Seizure ([place]) 03/15/2022   Atrial fibrillation, chronic ([place]) 03/15/2022   Protein-calorie malnutrition, moderate ([place]) 03/15/2022   Atrial fibrillation with RVR (HCC)    Metabolic acidosis 66/02/44   Acute renal failure superimposed on stage 3b chronic kidney disease ([place]) 02/27/2022   Anxiety and depression 02/27/2022   Meningioma, cerebral ([place]) 02/27/2022   Normocytic anemia 02/27/2022   Acute renal failure (ARF) ([place]) 02/27/2022   Hemorrhoids 01/04/2021   Ankle deformity, right 09/12/2020   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   Port-A-Cath in place 09/28/2018   Colon adenomas 08/06/2018   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 ([place]) 06/09/2018   Hypertension 01/25/2018   AKI (acute kidney injury) ([place]) 10/25/2016   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   Acute on chronic combined systolic and diastolic CHF (congestive heart failure) ([place])    Demand ischemia    Hypokalemia 09/29/2016   Prolonged Q-T interval on ECG 09/29/2016   Chronic kidney disease (CKD), stage III (moderate) (HCC) 09/29/2016   Leukocytosis 09/18/2016   Abnormal CT scan, chest    Upper airway cough syndrome  vs Cough variant asthma  04/09/2016   Chronic cough 03/17/2016   Seasonal allergies 08/08/2014   Family history of colon cancer 03/30/2014   Osteoporosis 99/77/4142   Metabolic syndrome X 39/53/2023   SVT (supraventricular tachycardia) 34/35/6861   Chronic systolic CHF (congestive  heart failure) ([place]) 03/14/2013   Vitamin D deficiency 07/23/2010   Morbid obesity ([place]) 07/04/2009   Malignant neoplasm of right breast ([place]) 12/13/2007   Hyperlipidemia 12/13/2007   Anxiety state 12/13/2007   Depression, major, single episode, in partial remission ([place]) 12/13/2007   GERD 12/13/2007   Thank you,  Genene Churn, [place]  Genene Churn, [place] 10/09/2022, 5:05 PM  [place], [place], [address] Phone: [phone]   Fax:  [phone]  Name: Tara [name] MRN: [id number] Date of Birth: 04/14/1946

## 2022-10-10 DIAGNOSIS — Z931 Gastrostomy status: Secondary | ICD-10-CM | POA: Diagnosis not present

## 2022-10-10 DIAGNOSIS — R1314 Dysphagia, pharyngoesophageal phase: Secondary | ICD-10-CM | POA: Diagnosis not present

## 2022-10-10 DIAGNOSIS — E872 Acidosis, unspecified: Secondary | ICD-10-CM | POA: Diagnosis not present

## 2022-10-10 DIAGNOSIS — F419 Anxiety disorder, unspecified: Secondary | ICD-10-CM | POA: Diagnosis not present

## 2022-10-10 DIAGNOSIS — I4891 Unspecified atrial fibrillation: Secondary | ICD-10-CM | POA: Diagnosis not present

## 2022-10-10 DIAGNOSIS — N179 Acute kidney failure, unspecified: Secondary | ICD-10-CM | POA: Diagnosis not present

## 2022-10-11 DIAGNOSIS — D329 Benign neoplasm of meninges, unspecified: Secondary | ICD-10-CM | POA: Diagnosis not present

## 2022-10-11 DIAGNOSIS — E440 Moderate protein-calorie malnutrition: Secondary | ICD-10-CM | POA: Diagnosis not present

## 2022-10-11 DIAGNOSIS — R1312 Dysphagia, oropharyngeal phase: Secondary | ICD-10-CM | POA: Diagnosis not present

## 2022-10-11 DIAGNOSIS — R1314 Dysphagia, pharyngoesophageal phase: Secondary | ICD-10-CM | POA: Diagnosis not present

## 2022-10-13 DIAGNOSIS — I4891 Unspecified atrial fibrillation: Secondary | ICD-10-CM | POA: Diagnosis not present

## 2022-10-15 DIAGNOSIS — G40909 Epilepsy, unspecified, not intractable, without status epilepticus: Secondary | ICD-10-CM | POA: Diagnosis not present

## 2022-10-15 DIAGNOSIS — I482 Chronic atrial fibrillation, unspecified: Secondary | ICD-10-CM | POA: Diagnosis not present

## 2022-10-15 DIAGNOSIS — D649 Anemia, unspecified: Secondary | ICD-10-CM | POA: Diagnosis not present

## 2022-10-15 DIAGNOSIS — K219 Gastro-esophageal reflux disease without esophagitis: Secondary | ICD-10-CM | POA: Diagnosis not present

## 2022-10-15 DIAGNOSIS — G7281 Critical illness myopathy: Secondary | ICD-10-CM | POA: Diagnosis not present

## 2022-10-15 DIAGNOSIS — I255 Ischemic cardiomyopathy: Secondary | ICD-10-CM | POA: Diagnosis not present

## 2022-10-15 DIAGNOSIS — I110 Hypertensive heart disease with heart failure: Secondary | ICD-10-CM | POA: Diagnosis not present

## 2022-10-15 DIAGNOSIS — E785 Hyperlipidemia, unspecified: Secondary | ICD-10-CM | POA: Diagnosis not present

## 2022-10-15 DIAGNOSIS — J690 Pneumonitis due to inhalation of food and vomit: Secondary | ICD-10-CM | POA: Diagnosis not present

## 2022-10-15 DIAGNOSIS — I5022 Chronic systolic (congestive) heart failure: Secondary | ICD-10-CM | POA: Diagnosis not present

## 2022-10-15 DIAGNOSIS — J9601 Acute respiratory failure with hypoxia: Secondary | ICD-10-CM | POA: Diagnosis not present

## 2022-10-15 DIAGNOSIS — F32A Depression, unspecified: Secondary | ICD-10-CM | POA: Diagnosis not present

## 2022-10-15 DIAGNOSIS — R1319 Other dysphagia: Secondary | ICD-10-CM | POA: Diagnosis not present

## 2022-10-15 DIAGNOSIS — F419 Anxiety disorder, unspecified: Secondary | ICD-10-CM | POA: Diagnosis not present

## 2022-10-15 DIAGNOSIS — I428 Other cardiomyopathies: Secondary | ICD-10-CM | POA: Diagnosis not present

## 2022-10-15 DIAGNOSIS — K449 Diaphragmatic hernia without obstruction or gangrene: Secondary | ICD-10-CM | POA: Diagnosis not present

## 2022-10-16 ENCOUNTER — Ambulatory Visit (HOSPITAL_COMMUNITY)
Admission: RE | Admit: 2022-10-16 | Discharge: 2022-10-16 | Disposition: A | Discharge status: Home / Self Care | Payer: Medicare Other | Source: Ambulatory Visit | Attending: Neurosurgery | Admitting: Neurosurgery

## 2022-10-16 DIAGNOSIS — G9389 Other specified disorders of brain: Secondary | ICD-10-CM | POA: Diagnosis not present

## 2022-10-16 DIAGNOSIS — D329 Benign neoplasm of meninges, unspecified: Secondary | ICD-10-CM | POA: Diagnosis not present

## 2022-10-16 MED ORDER — GADOBUTROL 1 MMOL/ML IV SOLN
7.0000 mL | Freq: Once | INTRAVENOUS | Status: AC | PRN
  Administered 2022-10-16: 7 mL via INTRAVENOUS

## 2022-10-17 DIAGNOSIS — I428 Other cardiomyopathies: Secondary | ICD-10-CM | POA: Diagnosis not present

## 2022-10-17 DIAGNOSIS — G7281 Critical illness myopathy: Secondary | ICD-10-CM | POA: Diagnosis not present

## 2022-10-17 DIAGNOSIS — D649 Anemia, unspecified: Secondary | ICD-10-CM | POA: Diagnosis not present

## 2022-10-17 DIAGNOSIS — I110 Hypertensive heart disease with heart failure: Secondary | ICD-10-CM | POA: Diagnosis not present

## 2022-10-17 DIAGNOSIS — F32A Depression, unspecified: Secondary | ICD-10-CM | POA: Diagnosis not present

## 2022-10-17 DIAGNOSIS — I482 Chronic atrial fibrillation, unspecified: Secondary | ICD-10-CM | POA: Diagnosis not present

## 2022-10-17 DIAGNOSIS — G40909 Epilepsy, unspecified, not intractable, without status epilepticus: Secondary | ICD-10-CM | POA: Diagnosis not present

## 2022-10-17 DIAGNOSIS — K219 Gastro-esophageal reflux disease without esophagitis: Secondary | ICD-10-CM | POA: Diagnosis not present

## 2022-10-17 DIAGNOSIS — F419 Anxiety disorder, unspecified: Secondary | ICD-10-CM | POA: Diagnosis not present

## 2022-10-17 DIAGNOSIS — I255 Ischemic cardiomyopathy: Secondary | ICD-10-CM | POA: Diagnosis not present

## 2022-10-17 DIAGNOSIS — J690 Pneumonitis due to inhalation of food and vomit: Secondary | ICD-10-CM | POA: Diagnosis not present

## 2022-10-17 DIAGNOSIS — J9601 Acute respiratory failure with hypoxia: Secondary | ICD-10-CM | POA: Diagnosis not present

## 2022-10-17 DIAGNOSIS — I5022 Chronic systolic (congestive) heart failure: Secondary | ICD-10-CM | POA: Diagnosis not present

## 2022-10-17 DIAGNOSIS — E785 Hyperlipidemia, unspecified: Secondary | ICD-10-CM | POA: Diagnosis not present

## 2022-10-17 DIAGNOSIS — R1319 Other dysphagia: Secondary | ICD-10-CM | POA: Diagnosis not present

## 2022-10-17 DIAGNOSIS — K449 Diaphragmatic hernia without obstruction or gangrene: Secondary | ICD-10-CM | POA: Diagnosis not present

## 2022-10-21 ENCOUNTER — Telehealth: Payer: Self-pay | Admitting: Family Medicine

## 2022-10-21 DIAGNOSIS — I482 Chronic atrial fibrillation, unspecified: Secondary | ICD-10-CM | POA: Diagnosis not present

## 2022-10-21 DIAGNOSIS — E785 Hyperlipidemia, unspecified: Secondary | ICD-10-CM | POA: Diagnosis not present

## 2022-10-21 DIAGNOSIS — G7281 Critical illness myopathy: Secondary | ICD-10-CM | POA: Diagnosis not present

## 2022-10-21 DIAGNOSIS — Z86011 Personal history of benign neoplasm of the brain: Secondary | ICD-10-CM | POA: Diagnosis not present

## 2022-10-21 DIAGNOSIS — Z9011 Acquired absence of right breast and nipple: Secondary | ICD-10-CM | POA: Diagnosis not present

## 2022-10-21 DIAGNOSIS — E669 Obesity, unspecified: Secondary | ICD-10-CM | POA: Diagnosis not present

## 2022-10-21 DIAGNOSIS — D649 Anemia, unspecified: Secondary | ICD-10-CM | POA: Diagnosis not present

## 2022-10-21 DIAGNOSIS — Z8611 Personal history of tuberculosis: Secondary | ICD-10-CM | POA: Diagnosis not present

## 2022-10-21 DIAGNOSIS — K449 Diaphragmatic hernia without obstruction or gangrene: Secondary | ICD-10-CM | POA: Diagnosis not present

## 2022-10-21 DIAGNOSIS — Z6838 Body mass index (BMI) 38.0-38.9, adult: Secondary | ICD-10-CM | POA: Diagnosis not present

## 2022-10-21 DIAGNOSIS — F419 Anxiety disorder, unspecified: Secondary | ICD-10-CM | POA: Diagnosis not present

## 2022-10-21 DIAGNOSIS — Z9181 History of falling: Secondary | ICD-10-CM | POA: Diagnosis not present

## 2022-10-21 DIAGNOSIS — Z86010 Personal history of colonic polyps: Secondary | ICD-10-CM | POA: Diagnosis not present

## 2022-10-21 DIAGNOSIS — Z853 Personal history of malignant neoplasm of breast: Secondary | ICD-10-CM | POA: Diagnosis not present

## 2022-10-21 DIAGNOSIS — G40909 Epilepsy, unspecified, not intractable, without status epilepticus: Secondary | ICD-10-CM | POA: Diagnosis not present

## 2022-10-21 DIAGNOSIS — R1319 Other dysphagia: Secondary | ICD-10-CM | POA: Diagnosis not present

## 2022-10-21 DIAGNOSIS — F32A Depression, unspecified: Secondary | ICD-10-CM | POA: Diagnosis not present

## 2022-10-21 DIAGNOSIS — Z7901 Long term (current) use of anticoagulants: Secondary | ICD-10-CM | POA: Diagnosis not present

## 2022-10-21 DIAGNOSIS — I255 Ischemic cardiomyopathy: Secondary | ICD-10-CM | POA: Diagnosis not present

## 2022-10-21 DIAGNOSIS — Z8701 Personal history of pneumonia (recurrent): Secondary | ICD-10-CM | POA: Diagnosis not present

## 2022-10-21 DIAGNOSIS — I5022 Chronic systolic (congestive) heart failure: Secondary | ICD-10-CM | POA: Diagnosis not present

## 2022-10-21 DIAGNOSIS — K219 Gastro-esophageal reflux disease without esophagitis: Secondary | ICD-10-CM | POA: Diagnosis not present

## 2022-10-21 DIAGNOSIS — I110 Hypertensive heart disease with heart failure: Secondary | ICD-10-CM | POA: Diagnosis not present

## 2022-10-21 DIAGNOSIS — I428 Other cardiomyopathies: Secondary | ICD-10-CM | POA: Diagnosis not present

## 2022-10-21 DIAGNOSIS — Z431 Encounter for attention to gastrostomy: Secondary | ICD-10-CM | POA: Diagnosis not present

## 2022-10-21 NOTE — Telephone Encounter (Signed)
Dedrie called from home health speech therapist left voicemail while at lunch 12:21 pm. said patient has been vomiting with oral in take and through, concern of aspiration pneumonia please return dedrie call back # [phone].

## 2022-10-22 ENCOUNTER — Ambulatory Visit (INDEPENDENT_AMBULATORY_CARE_PROVIDER_SITE_OTHER): Payer: Medicare Other | Admitting: Internal Medicine

## 2022-10-22 ENCOUNTER — Encounter: Payer: Self-pay | Admitting: Family Medicine

## 2022-10-22 ENCOUNTER — Ambulatory Visit (INDEPENDENT_AMBULATORY_CARE_PROVIDER_SITE_OTHER): Payer: Medicare Other | Admitting: Family Medicine

## 2022-10-22 ENCOUNTER — Encounter: Payer: Self-pay | Admitting: Internal Medicine

## 2022-10-22 VITALS — BP 107/68 | HR 89 | Ht <= 58 in | Wt 159.0 lb

## 2022-10-22 VITALS — BP 107/68 | HR 89 | Ht <= 58 in | Wt 159.1 lb

## 2022-10-22 DIAGNOSIS — R1312 Dysphagia, oropharyngeal phase: Secondary | ICD-10-CM | POA: Diagnosis not present

## 2022-10-22 DIAGNOSIS — F419 Anxiety disorder, unspecified: Secondary | ICD-10-CM

## 2022-10-22 DIAGNOSIS — Z0000 Encounter for general adult medical examination without abnormal findings: Secondary | ICD-10-CM

## 2022-10-22 DIAGNOSIS — F32A Depression, unspecified: Secondary | ICD-10-CM

## 2022-10-22 DIAGNOSIS — K5900 Constipation, unspecified: Secondary | ICD-10-CM

## 2022-10-22 DIAGNOSIS — K2100 Gastro-esophageal reflux disease with esophagitis, without bleeding: Secondary | ICD-10-CM | POA: Diagnosis not present

## 2022-10-22 DIAGNOSIS — G40909 Epilepsy, unspecified, not intractable, without status epilepticus: Secondary | ICD-10-CM | POA: Diagnosis not present

## 2022-10-22 NOTE — Patient Instructions (Signed)
  Tara [name] , Thank you for taking time to come for your Medicare Wellness Visit. I appreciate your ongoing commitment to your health goals. Please review the following plan we discussed and let me know if I can assist you in the future.   These are the goals we discussed: Patient had a brain tumor this past year. She would like to regain her strength and spend time with her family.   This is a list of the screening recommended for you and due dates:  Health Maintenance  Topic Date Due   DTaP/Tdap/Td vaccine (1 - Tdap) Never done   Zoster (Shingles) Vaccine (2 of 2) 01/13/1996   Colon Cancer Screening  04/27/2019   COVID-19 Vaccine (4 - 2023-24 season) 05/23/2022   Pneumonia Vaccine  Completed   Flu Shot  Completed   DEXA scan (bone density measurement)  Completed   Hepatitis C Screening: USPSTF Recommendation to screen - Ages 82-79 yo.  Completed   HPV Vaccine  Aged Out

## 2022-10-22 NOTE — Patient Instructions (Addendum)
F/U in 2.5 months, call if you need me sooner  Start stool softener colace twice daily  If no BM I take dulcolax suppository or enema to facilitate bowel movement  Will try to get urgent GI evaluation for you  Stop sertraline  Careful not to fall Thanks for choosing [facility], we consider it a privelige to serve you.

## 2022-10-22 NOTE — Progress Notes (Signed)
Subjective:   Tara [name] is a 77 y.o. female who presents for Medicare Annual (Subsequent) preventive examination.  Review of Systems    Review of Systems  All other systems reviewed and are negative.    Objective:    Today's Vitals   10/22/22 1020  BP: 107/68  Pulse: 89  SpO2: 97%  Weight: 159 lb 1.9 oz (72.2 kg)  Height: '4\' 9"'$  (1.448 m)  PainSc: 0-No pain   Body mass index is 34.43 kg/m.     09/20/2022    1:36 AM 08/14/2022   12:00 PM 03/15/2022    5:00 PM 03/15/2022    1:30 AM 02/27/2022    2:23 PM 05/01/2021   11:26 AM 10/25/2018    3:20 PM  Advanced Directives  Does Patient Have a Medical Advance Directive? No Yes No No No No No  Type of Advance Directive  Out of facility DNR (pink MOST or yellow form)       Does patient want to make changes to medical advance directive?  Yes (ED - send information to [place])       Would patient like information on creating a medical advance directive?   No - Patient declined;No - Guardian declined No - Patient declined No - Patient declined Yes (MAU/Ambulatory/Procedural Areas - Information given) No - Patient declined  Pre-existing out of facility DNR order (yellow form or pink MOST form)  Pink Most/Yellow Form available - Physician notified to receive inpatient order         Current Medications (verified) Outpatient Encounter Medications as of 10/22/2022  Medication Sig   acetaminophen (TYLENOL) 500 MG tablet Place 500-1,000 mg into feeding tube every 6 (six) hours as needed for mild pain.   amiodarone (PACERONE) 200 MG tablet Take 1 tablet (200 mg total) by mouth daily. (Patient taking differently: Place 200 mg into feeding tube daily.)   Cholecalciferol (VITAMIN D) 50 MCG (2000 UT) CAPS Place 2,000 Units into feeding tube daily.   clonazePAM (KLONOPIN) 0.5 MG tablet Take 1 tablet (0.5 mg total) by mouth at bedtime. (Patient not taking: Reported on 09/20/2022)   docusate (COLACE) 50 MG/5ML liquid 5 ml via  tube daily   ELIQUIS  5 MG TABS tablet TAKE 1 TABLET VIA G-TUBE 2 TIMES DAILY.   ipratropium-albuterol (DUONEB) 0.5-2.5 (3) MG/3ML SOLN Take 3 mLs by nebulization every 6 (six) hours as needed. (Patient taking differently: Take 3 mLs by nebulization every 6 (six) hours as needed (Shortness of breath).)   lacosamide (VIMPAT) 10 MG/ML oral solution Take 200 mg by mouth 2 (two) times daily.   levETIRAcetam (KEPPRA) 100 MG/ML solution TAKE 7.5 MLS VIA G-TUBE 2 TIMES DAILY. (Patient taking differently: Place into feeding tube See admin instructions. Take 7.5 ml by mouth twice daily per patient)   montelukast (SINGULAIR) 10 MG tablet Take 1 tablet (10 mg total) by mouth at bedtime.   pantoprazole (PROTONIX) 40 MG tablet TAKE (1) TABLET BY MOUTH TWICE DAILY. (Patient taking differently: Take 40 mg by mouth 2 (two) times daily.)   polyethylene glycol powder (GLYCOLAX/MIRALAX) 17 GM/SCOOP powder 17 gm via feeding tube daily   Protein (FEEDING SUPPLEMENT, PROSOURCE TF20,) liquid Place 60 mLs into feeding tube daily.   rosuvastatin (CRESTOR) 20 MG tablet Place 1 tablet (20 mg total) into feeding tube daily.   UNABLE TO FIND Med Name: Prosource-TF 20 Liquid Place 60 MLS into feeding tube daily.   UNABLE TO FIND Med Name: Gloves Size:Medium   UNABLE TO FIND  Med Name: Disposable Bed Pads Size: XXL   UNABLE TO FIND Med Name: Adult Pull up Briefs Size : L   Water For Irrigation, Sterile (FREE WATER) SOLN Place 100 mLs into feeding tube every 12 (twelve) hours. (Slowly)   No facility-administered encounter medications on file as of 10/22/2022.    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   Anxiety    Breast cancer ([place]) 2008   right - s/p lumpectomy->chemo, radiation   Depression    Dysrhythmia    hx SVT   Family history of colon cancer    Family history of  prostate cancer    GERD (gastroesophageal reflux disease)    H/O: hysterectomy    Hiatal hernia    History of cancer chemotherapy    History of radiation therapy    Hyperlipidemia    Hypertension 01/25/2018   Nonischemic cardiomyopathy ([place])    Personal history of radiation therapy 01/05/209   SVT (supraventricular tachycardia)    short RP SVT documented 7/78   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[place])    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)   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   Past Surgical History:  Procedure Laterality Date   ABDOMINAL HYSTERECTOMY  1994   fibroids,    BIOPSY  06/19/2016   Procedure: BIOPSY;  Surgeon: Daneil Dolin, MD;  [facility];  Service: Endoscopy;;  gastric duodenum   BREAST BIOPSY Right 12/16/2006   malignant   BREAST EXCISIONAL BIOPSY Right 2018   benign lumpectomy   BREAST LUMPECTOMY Right 02/2007   BREAST LUMPECTOMY WITH RADIOACTIVE SEED LOCALIZATION Right 07/29/2017   Procedure: RIGHT BREAST LUMPECTOMY WITH RADIOACTIVE SEED LOCALIZATION;  Surgeon: Fanny Skates, MD;  Location: [facility];  Service: General;  Laterality: Right;   BREAST SURGERY Right 2008   lumpectomy, cancer   CARDIAC CATHETERIZATION     CHOLECYSTECTOMY  1999   COLONOSCOPY  2008   Dr. Oneida Alar: multiple polyps. Path not available at time of visit.    COLONOSCOPY WITH PROPOFOL N/A 04/25/2014   Dr. Oneida Alar: Multiple tubular adenomas removed. Diverticulosis. Moderate internal hemorrhoids. Next colonoscopy planned for August 2018.   CRANIOTOMY Bilateral 03/24/2022   Procedure: Bifrontal craniotomy for resection of meningioma;  Surgeon: Ashok Pall, MD;  Location: [facility];  Service: Neurosurgery;  Laterality: Bilateral;   ESOPHAGOGASTRODUODENOSCOPY (EGD) WITH PROPOFOL N/A 06/19/2016   Procedure: ESOPHAGOGASTRODUODENOSCOPY (EGD) WITH PROPOFOL;  Surgeon: Daneil Dolin, MD;  [facility];  Service: Endoscopy;  Laterality: N/A;    MASTECTOMY W/ SENTINEL NODE BIOPSY Right 06/09/2018   Procedure: RIGHT TOTAL MASTECTOMY WITH SENTINEL LYMPH NODE BIOPSY;  Surgeon: Fanny Skates, MD;  Location: [facility];  Service: General;  Laterality: Right;   POLYPECTOMY N/A 04/25/2014   Procedure: POLYPECTOMY;  Surgeon: Danie Binder, MD;  Location: [facility];  Service: Endoscopy;  Laterality: N/A;  Ascending and Decending Colon x3 , Transverse colon x2, rectal   PORTACATH PLACEMENT N/A 06/09/2018   Procedure: INSERTION PORT-A-CATH;  Surgeon: Fanny Skates, MD;  Location: [facility];  Service: General;  Laterality: N/A;   RIGHT/LEFT HEART CATH AND CORONARY ANGIOGRAPHY N/A 07/10/2017   Procedure: RIGHT/LEFT HEART CATH AND CORONARY ANGIOGRAPHY;  Surgeon: Larey Dresser, MD;  Location: [facility];  Service: Cardiovascular;  Laterality: N/A;   Family History  Problem Relation Age of Onset   Colon cancer Father 15       died at age 33   Hypertension Sister    Heart disease Sister    Cancer Sister        unknown form   Dementia Mother    Stroke Mother 46       left hemiparesis   Cancer Brother 1       prostate   Cancer Paternal Aunt        NOS   Lung cancer Paternal Uncle    Other Paternal Uncle        lightning strike   Other Paternal Uncle        hit by train   Other Paternal 21        old age   13 Cousin        NOS pat first cousin   Cancer Cousin        NOS pat first cousin   Prostate cancer Cousin        pat first cousin   Social History   Socioeconomic History   Marital status: Married    Spouse name: Not on file   Number of children: 2   Years of education: Not on file   Highest education level: Not on file  Occupational History   Occupation: disabled     Employer: UNEMPLOYED  Tobacco Use   Smoking status: Never   Smokeless tobacco: Never  Vaping Use   Vaping Use: Never used  Substance and Sexual Activity   Alcohol use: No   Drug use: No   Sexual activity: Not on file  Other Topics Concern    Not on file  Social History Narrative   Lives in [place] with family.  Does not routinely exercise.   Social Determinants of Health   Financial Resource Strain: Low Risk  (05/01/2021)   Overall Financial Resource Strain (CARDIA)    Difficulty of Paying Living Expenses: Not hard at all  Food Insecurity: No Food Insecurity (08/14/2022)   Hunger Vital Sign    Worried About Running Out of Food in the Last Year: Never true    Ran Out of Food in the Last Year: Never true  Transportation Needs: No Transportation Needs (08/14/2022)   PRAPARE - Hydrologist (Medical): No    Lack of Transportation (Non-Medical): No  Physical Activity: Insufficiently Active (05/01/2021)   Exercise Vital Sign    Days of Exercise per Week: 3 days    Minutes of Exercise per Session: 30 min  Stress: No Stress Concern Present (05/01/2021)   [place]    Feeling of Stress : Not at all  Social Connections: Moderately Integrated (05/01/2021)   Social Connection and Isolation Panel [NHANES]    Frequency of Communication with Friends and Family: More than three times a week    Frequency of Social Gatherings with Friends and Family: More than three times a week    Attends Religious Services: More than 4 times per year    Active Member of [employer] or Organizations: No    Attends Archivist Meetings: Never    Marital Status: Married    Tobacco Counseling Counseling given: Yes   Clinical Intake:     Pain Score: 0-No pain           Diabetic?no  Activities of Daily Living    09/20/2022    5:21 PM 08/14/2022   12:00 PM  In your present state of health, do you have any difficulty performing the following activities:  Hearing? 0 0  Vision? 0 0  Difficulty concentrating or making decisions? 0 1  Dressing or bathing? 1 1    Patient Care Team: [name], MD as PCP -  Bethena Roys, MD (Inactive) as Consulting Physician (Cardiology) Cottle, Billey Co., MD as Attending Physician (Psychiatry) Gala Romney Cristopher Estimable, MD as Consulting Physician (Gastroenterology)  Indicate any recent Medical Services you may have received from other than [facility] providers in the past year (date may be approximate).     Assessment:   This is a routine wellness examination for [place].  Hearing/Vision screen No results found.  Dietary issues and exercise activities discussed:     Goals Addressed   None    Depression Screen    08/14/2021    9:48 AM 05/28/2021   11:11 AM 05/01/2021   11:27 AM 05/01/2021   11:24 AM 12/25/2020   10:02 AM 08/07/2020    9:29 AM 04/23/2020    1:32 PM  PHQ 2/9 Scores  PHQ - 2 Score 0 0 0 0 0 0 0  PHQ- 9 Score 0     0     Fall Risk    01/14/2022   11:05 AM 10/09/2021   11:29 AM 08/14/2021    9:48 AM 05/28/2021   11:10 AM 05/01/2021   11:27 AM  Fall Risk   Falls in the past year? 0 0 0 0 0  Number falls in past yr: 0 0 0 0 0  Injury with Fall? 0 0 0 0 0  Risk for fall due to :     No Fall Risks  Follow up     Falls evaluation completed    FALL RISK PREVENTION PERTAINING TO THE HOME:  Any stairs in or around the home? Yes  If so, are there any without handrails? Yes  Home free of loose throw rugs in walkways, pet beds, electrical cords, etc? Yes  Adequate lighting in your home to reduce risk of falls? Yes   ASSISTIVE DEVICES UTILIZED TO PREVENT FALLS:  Life alert? Yes  Use of a cane, walker or w/c? Yes  Grab bars in the bathroom? Yes  Shower chair or bench in shower? Yes  Elevated toilet seat or a handicapped toilet? Yes    Cognitive Function:    05/01/2021   11:27 AM  MMSE - Mini Mental State Exam  Not completed: Unable to complete        05/01/2021   11:28 AM 04/23/2020    1:32 PM 04/05/2019   10:00 AM 04/05/2019    9:55 AM  6CIT Screen  What Year? 0 points 0 points 0 points 0 points  What month? 0 points 0 points   0 points  What time? 0 points 0 points 0 points 0 points  Count back from 20 0 points 0 points 0 points 0 points  Months in reverse 0 points 0 points 0 points 0 points  Repeat phrase 0 points 0 points 0 points 0 points  Total Score 0 points 0 points  0 points    Immunizations Immunization History  Administered Date(s) Administered   Fluad Quad(high Dose 65+) 08/08/2019, 08/07/2020, 05/28/2021, 08/15/2022   PFIZER(Purple Top)SARS-COV-2 Vaccination 12/08/2019, 01/02/2020, 09/13/2020   Pneumococcal Conjugate-13 12/11/2014   Pneumococcal Polysaccharide-23 07/03/2016  TDAP status: Due, Education has been provided regarding the importance of this vaccine. Advised may receive this vaccine at local pharmacy or Health Dept. Aware to provide a copy of the vaccination record if obtained from local pharmacy or Health Dept. Verbalized acceptance and understanding.  Flu Vaccine status: Up to date  Pneumococcal vaccine status: Up to date  Covid-19 vaccine status: Information provided on how to obtain vaccines.   Qualifies for Shingles Vaccine? Yes   Zostavax completed No   Shingrix Completed?: Yes  Screening Tests Health Maintenance  Topic Date Due   DTaP/Tdap/Td (1 - Tdap) Never done   Zoster Vaccines- Shingrix (1 of 2) Never done   COLONOSCOPY (Pts 45-80yr Insurance coverage will need to be confirmed)  04/27/2019   COVID-19 Vaccine (4 - 2023-24 season) 05/23/2022   Pneumonia Vaccine 77 Years old  Completed   INFLUENZA VACCINE  Completed   DEXA SCAN  Completed   Hepatitis C Screening  Completed   HPV VACCINES  Aged Out    Health Maintenance  Health Maintenance Due  Topic Date Due   DTaP/Tdap/Td (1 - Tdap) Never done   Zoster Vaccines- Shingrix (1 of 2) Never done   COLONOSCOPY (Pts 45-450yrInsurance coverage will need to be confirmed)  04/27/2019   COVID-19 Vaccine (4 - 2023-24 season) 05/23/2022    Colorectal cancer screening: No longer required.   Mammogram status: No  longer required due to age.  Bone Density status: Completed 02/14/2014. Results reflect: Bone density results: OSTEOPOROSIS.   Lung Cancer Screening: (Low Dose CT Chest recommended if Age 77-80ears, 30 pack-year currently smoking OR have quit w/in 15years.) does not qualify.    Additional Screening:  Hepatitis C Screening: does not qualify; Completed 10/09/2015  Vision Screening: Recommended annual ophthalmology exams for early detection of glaucoma and other disorders of the eye. Is the patient up to date with their annual eye exam?  Yes  Who is the provider or what is the name of the office in which the patient attends annual eye exams? [facility] If pt is not established with a provider, would they like to be referred to a provider to establish care? No .   Dental Screening: Recommended annual dental exams for proper oral hygiene  Community Resource Referral / Chronic Care Management: CRR required this visit?  No   CCM required this visit?  No      Plan:     I have personally reviewed and noted the following in the patient's chart:   Medical and social history Use of alcohol, tobacco or illicit drugs  Current medications and supplements including opioid prescriptions. Patient is not currently taking opioid prescriptions. Functional ability and status Nutritional status Physical activity Advanced directives List of other physicians Hospitalizations, surgeries, and ER visits in previous 12 months Vitals Screenings to include cognitive, depression, and falls Referrals and appointments  In addition, I have reviewed and discussed with patient certain preventive protocols, quality metrics, and best practice recommendations. A written personalized care plan for preventive services as well as general preventive health recommendations were provided to patient.     JeLorene DyMD   10/22/2022

## 2022-10-23 DIAGNOSIS — Z9011 Acquired absence of right breast and nipple: Secondary | ICD-10-CM

## 2022-10-23 DIAGNOSIS — Z8611 Personal history of tuberculosis: Secondary | ICD-10-CM

## 2022-10-23 DIAGNOSIS — D649 Anemia, unspecified: Secondary | ICD-10-CM | POA: Diagnosis not present

## 2022-10-23 DIAGNOSIS — Z9181 History of falling: Secondary | ICD-10-CM

## 2022-10-23 DIAGNOSIS — Z431 Encounter for attention to gastrostomy: Secondary | ICD-10-CM

## 2022-10-23 DIAGNOSIS — I255 Ischemic cardiomyopathy: Secondary | ICD-10-CM | POA: Diagnosis not present

## 2022-10-23 DIAGNOSIS — Z6838 Body mass index (BMI) 38.0-38.9, adult: Secondary | ICD-10-CM | POA: Diagnosis not present

## 2022-10-23 DIAGNOSIS — I428 Other cardiomyopathies: Secondary | ICD-10-CM | POA: Diagnosis not present

## 2022-10-23 DIAGNOSIS — I110 Hypertensive heart disease with heart failure: Secondary | ICD-10-CM | POA: Diagnosis not present

## 2022-10-23 DIAGNOSIS — G40909 Epilepsy, unspecified, not intractable, without status epilepticus: Secondary | ICD-10-CM | POA: Diagnosis not present

## 2022-10-23 DIAGNOSIS — Z7901 Long term (current) use of anticoagulants: Secondary | ICD-10-CM

## 2022-10-23 DIAGNOSIS — G7281 Critical illness myopathy: Secondary | ICD-10-CM | POA: Diagnosis not present

## 2022-10-23 DIAGNOSIS — F419 Anxiety disorder, unspecified: Secondary | ICD-10-CM | POA: Diagnosis not present

## 2022-10-23 DIAGNOSIS — K224 Dyskinesia of esophagus: Secondary | ICD-10-CM | POA: Diagnosis not present

## 2022-10-23 DIAGNOSIS — K311 Adult hypertrophic pyloric stenosis: Secondary | ICD-10-CM | POA: Diagnosis not present

## 2022-10-23 DIAGNOSIS — I5022 Chronic systolic (congestive) heart failure: Secondary | ICD-10-CM | POA: Diagnosis not present

## 2022-10-23 DIAGNOSIS — I482 Chronic atrial fibrillation, unspecified: Secondary | ICD-10-CM | POA: Diagnosis not present

## 2022-10-23 DIAGNOSIS — E669 Obesity, unspecified: Secondary | ICD-10-CM | POA: Diagnosis not present

## 2022-10-23 DIAGNOSIS — K449 Diaphragmatic hernia without obstruction or gangrene: Secondary | ICD-10-CM | POA: Diagnosis not present

## 2022-10-23 DIAGNOSIS — R112 Nausea with vomiting, unspecified: Secondary | ICD-10-CM | POA: Diagnosis not present

## 2022-10-23 DIAGNOSIS — Z853 Personal history of malignant neoplasm of breast: Secondary | ICD-10-CM

## 2022-10-23 DIAGNOSIS — Z86010 Personal history of colonic polyps: Secondary | ICD-10-CM

## 2022-10-23 DIAGNOSIS — R1312 Dysphagia, oropharyngeal phase: Secondary | ICD-10-CM | POA: Diagnosis not present

## 2022-10-23 DIAGNOSIS — K219 Gastro-esophageal reflux disease without esophagitis: Secondary | ICD-10-CM | POA: Diagnosis not present

## 2022-10-23 DIAGNOSIS — F32A Depression, unspecified: Secondary | ICD-10-CM | POA: Diagnosis not present

## 2022-10-23 DIAGNOSIS — R1319 Other dysphagia: Secondary | ICD-10-CM | POA: Diagnosis not present

## 2022-10-23 DIAGNOSIS — E785 Hyperlipidemia, unspecified: Secondary | ICD-10-CM | POA: Diagnosis not present

## 2022-10-23 DIAGNOSIS — Z8701 Personal history of pneumonia (recurrent): Secondary | ICD-10-CM

## 2022-10-23 DIAGNOSIS — Z86011 Personal history of benign neoplasm of the brain: Secondary | ICD-10-CM

## 2022-10-24 ENCOUNTER — Ambulatory Visit: Payer: Medicare Other | Admitting: Neurology

## 2022-10-24 ENCOUNTER — Encounter: Payer: Self-pay | Admitting: Neurology

## 2022-10-24 VITALS — BP 117/51 | HR 64

## 2022-10-24 DIAGNOSIS — G20A1 Parkinson's disease without dyskinesia, without mention of fluctuations: Secondary | ICD-10-CM

## 2022-10-24 DIAGNOSIS — D329 Benign neoplasm of meninges, unspecified: Secondary | ICD-10-CM

## 2022-10-24 DIAGNOSIS — Z5181 Encounter for therapeutic drug level monitoring: Secondary | ICD-10-CM | POA: Diagnosis not present

## 2022-10-24 DIAGNOSIS — G40909 Epilepsy, unspecified, not intractable, without status epilepticus: Secondary | ICD-10-CM

## 2022-10-24 NOTE — Progress Notes (Signed)
[facility]  PATIENT: Tara STEAD DOB: [birth year]  REQUESTING CLINICIAN: Norval Morton, MD HISTORY FROM: Patient and husband  REASON FOR VISIT: Seizure disorder    HISTORICAL  CHIEF COMPLAINT:  Chief Complaint  Patient presents with   New Patient (Initial Visit)    Rm 12,  NP/internal referral for seizures s/p craniotomy. 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  Patient have a long standing history of meningioma, in June she presented with lethargy and was found to have new vasogenic edema in the adjacent frontal lobe.  The meningioma was resected.  Husband stated after resection of the meningioma she had seizures.  She had a total of 3 seizures and they are all described as generalized convulsion.  She is on Vimpat 200 mg twice daily and Keppra 750 mg twice daily but husband reported lethargy with the medicine and also reports shaking.  Husband stated whenever he gave him the medication he noted that patient shakes all over.  Currently he is on a wheelchair she is able to stand but has difficulty with ambulation.She is doing home PT and OT.   Handedness: Right-handed  Onset: In June after resection of a meningioma  Seizure Type: Described as generalized convulsion  Current frequency: Total of 3 seizures  Any injuries from seizures: Denies  Seizure risk factors: Meningioma s/p resection  Previous ASMs: Levetiracetam and lacosamide  Currenty ASMs: Levetiracetam 750 mg twice daily and lacosamide 200 mg twice daily  ASMs side effects: Lethargy  Brain Images: Postsurgical change  Previous EEGs: Diffuse slowing   OTHER MEDICAL CONDITIONS: Meningioma s/p resection seizure disorder,  REVIEW OF SYSTEMS: Full 14 system review of systems performed and negative with  exception of: As noted in the HPI   ALLERGIES: Allergies  Allergen Reactions   Aspirin Other (See Comments)    Reports GI bleed--pt is currently taking   Lisinopril Cough   Sudafed [Pseudoephedrine Hcl]     Pt reports she had drainage in throat that made her throat hurt.     HOME MEDICATIONS: Outpatient Medications Prior to Visit  Medication Sig Dispense Refill   acetaminophen (TYLENOL) 500 MG tablet Place 500-1,000 mg into feeding tube every 6 (six) hours as needed for mild pain.     amiodarone (PACERONE) 200 MG tablet Take 1 tablet (200 mg total) by mouth daily. (Patient taking differently: Place 200 mg into feeding tube daily.) 30 tablet 3   clonazePAM (KLONOPIN) 0.5 MG tablet Take 1 tablet (0.5 mg total) by mouth at bedtime. 30 tablet 5   docusate (COLACE) 50 MG/5ML liquid 5 ml via  tube daily 100 mL 1   ELIQUIS 5 MG TABS tablet TAKE 1 TABLET VIA G-TUBE 2 TIMES DAILY. 60 tablet 0   lacosamide (VIMPAT) 10 MG/ML oral solution Take 200 mg by mouth 2 (two) times daily.     levETIRAcetam (KEPPRA) 100 MG/ML solution TAKE 7.5 MLS VIA G-TUBE 2 TIMES DAILY. (Patient taking differently: Place 750 mg into feeding tube 2 (two) times daily.) 450 mL 5   pantoprazole (PROTONIX) 40 MG tablet TAKE (1) TABLET BY MOUTH TWICE DAILY. (Patient taking differently: Take 40 mg by mouth 2 (two) times daily.) 60 tablet 3   polyethylene glycol powder (GLYCOLAX/MIRALAX) 17 GM/SCOOP powder 17 gm via feeding tube daily 3350 g 1  Protein (FEEDING SUPPLEMENT, PROSOURCE TF20,) liquid Place 60 mLs into feeding tube daily. 1800 mL 3   rosuvastatin (CRESTOR) 20 MG tablet Place 1 tablet (20 mg total) into feeding tube daily.     sertraline (ZOLOFT) 25 MG tablet Take 25 mg by mouth daily.     UNABLE TO FIND Med Name: Prosource-TF 20 Liquid Place 60 MLS into feeding tube daily. 1800 mL 3   UNABLE TO FIND Med Name: Gloves Size:Medium 200 each 12   UNABLE TO FIND Med Name: Disposable Bed Pads Size: XXL 200 each 12    UNABLE TO FIND Med Name: Adult Pull up Briefs Size : L 200 each 12   Water For Irrigation, Sterile (FREE WATER) SOLN Place 100 mLs into feeding tube every 12 (twelve) hours. (Slowly)     Cholecalciferol (VITAMIN D) 50 MCG (2000 UT) CAPS Place 2,000 Units into feeding tube daily.     ipratropium-albuterol (DUONEB) 0.5-2.5 (3) MG/3ML SOLN Take 3 mLs by nebulization every 6 (six) hours as needed. (Patient taking differently: Take 3 mLs by nebulization every 6 (six) hours as needed (Shortness of breath).) 360 mL 3   montelukast (SINGULAIR) 10 MG tablet Take 1 tablet (10 mg total) by mouth at bedtime. 30 tablet 3   No facility-administered medications prior to visit.    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   Anxiety    Breast cancer ([place]) 2008   right - s/p lumpectomy->chemo, radiation   Depression    Dysrhythmia    hx SVT   Family history of colon cancer    Family history of prostate cancer    GERD (gastroesophageal reflux disease)    H/O: hysterectomy    Hiatal hernia    History of cancer chemotherapy    History of radiation therapy    Hyperlipidemia    Hypertension 01/25/2018   Nonischemic cardiomyopathy ([place])    Personal history of radiation therapy 01/05/209   SVT (supraventricular tachycardia)    short RP SVT documented 0/62   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[place])    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)   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    PAST SURGICAL HISTORY: Past Surgical History:  Procedure Laterality Date   ABDOMINAL HYSTERECTOMY  1994   fibroids,    BIOPSY  06/19/2016   Procedure: BIOPSY;  Surgeon: Daneil Dolin, MD;  [facility];  Service: Endoscopy;;  gastric duodenum   BREAST BIOPSY Right 12/16/2006   malignant   BREAST EXCISIONAL BIOPSY Right 2018   benign lumpectomy   BREAST LUMPECTOMY  Right 02/2007   BREAST LUMPECTOMY WITH RADIOACTIVE SEED LOCALIZATION Right 07/29/2017   Procedure: RIGHT BREAST LUMPECTOMY WITH RADIOACTIVE SEED LOCALIZATION;  Surgeon: Fanny Skates, MD;  Location: [place];  Service: General;  Laterality: Right;   BREAST SURGERY Right 2008   lumpectomy, cancer   CARDIAC CATHETERIZATION     CHOLECYSTECTOMY  1999   COLONOSCOPY  2008   Dr. Oneida Alar: multiple polyps. Path not available at time of visit.    COLONOSCOPY WITH PROPOFOL N/A 04/25/2014   Dr. Oneida Alar: Multiple tubular adenomas removed. Diverticulosis. Moderate internal hemorrhoids. Next colonoscopy planned for August 2018.   CRANIOTOMY Bilateral 03/24/2022   Procedure: Bifrontal craniotomy for resection of meningioma;  Surgeon: Ashok Pall, MD;  Location: [place];  Service: Neurosurgery;  Laterality: Bilateral;   ESOPHAGOGASTRODUODENOSCOPY (EGD) WITH PROPOFOL N/A 06/19/2016   Procedure: ESOPHAGOGASTRODUODENOSCOPY (EGD) WITH PROPOFOL;  Surgeon: Daneil Dolin, MD;  [facility];  Service: Endoscopy;  Laterality: N/A;   MASTECTOMY W/ SENTINEL NODE BIOPSY Right 06/09/2018   Procedure: RIGHT TOTAL MASTECTOMY WITH SENTINEL LYMPH NODE BIOPSY;  Surgeon: Fanny Skates, MD;  Location: [place];  Service: General;  Laterality: Right;   POLYPECTOMY N/A 04/25/2014   Procedure: POLYPECTOMY;  Surgeon: Danie Binder, MD;  [facility];  Service: Endoscopy;  Laterality: N/A;  Ascending and Decending Colon x3 , Transverse colon x2, rectal   PORTACATH PLACEMENT N/A 06/09/2018   Procedure: INSERTION PORT-A-CATH;  Surgeon: Fanny Skates, MD;  Location: [place];  Service: General;  Laterality: N/A;   RIGHT/LEFT HEART CATH AND CORONARY ANGIOGRAPHY N/A 07/10/2017   Procedure: RIGHT/LEFT HEART CATH AND CORONARY ANGIOGRAPHY;  Surgeon: Larey Dresser, MD;  Location: [place] [facility];  Service: Cardiovascular;  Laterality: N/A;    FAMILY HISTORY: Family History  Problem Relation Age of Onset   Colon cancer Father 71        died at age 7   Hypertension Sister    Heart disease Sister    Cancer Sister        unknown form   Dementia Mother    Stroke Mother 90       left hemiparesis   Cancer Brother 55       prostate   Cancer Paternal Aunt        NOS   Lung cancer Paternal Uncle    Other Paternal Uncle        lightning strike   Other Paternal Uncle        hit by train   Other Paternal 51        old age   32 Cousin        NOS pat first cousin   Cancer Cousin        NOS pat first cousin   Prostate cancer Cousin        pat first cousin    SOCIAL HISTORY: Social History   Socioeconomic History   Marital status: Married    Spouse name: Not on file   Number of children: 2   Years of education: Not on file   Highest education level: Not on file  Occupational History   Occupation: disabled     Employer: UNEMPLOYED  Tobacco Use   Smoking status: Never   Smokeless tobacco: Never  Vaping Use   Vaping Use: Never used  Substance and Sexual Activity   Alcohol use: No   Drug use: No   Sexual activity: Not on file  Other Topics Concern   Not on file  Social History Narrative   Lives in [place] with family.  Does not routinely exercise.   Social Determinants of Health   Financial Resource Strain: Low Risk  (05/01/2021)   Overall Financial Resource Strain (CARDIA)    Difficulty of Paying Living Expenses: Not hard at all  Food Insecurity: No Food Insecurity (08/14/2022)   Hunger Vital Sign    Worried About Running Out of Food in the Last Year: Never true    Ran Out of Food in the Last Year: Never true  Transportation Needs: No Transportation Needs (08/14/2022)   PRAPARE - Hydrologist (Medical): No    Lack of Transportation (Non-Medical): No  Physical Activity: Insufficiently Active (05/01/2021)   Exercise Vital  Sign    Days of Exercise per Week: 3 days    Minutes of Exercise per Session: 30 min  Stress: No Stress Concern Present (05/01/2021)    [place]    Feeling of Stress : Not at all  Social Connections: Moderately Integrated (05/01/2021)   Social Connection and Isolation Panel [NHANES]    Frequency of Communication with Friends and Family: More than three times a week    Frequency of Social Gatherings with Friends and Family: More than three times a week    Attends Religious Services: More than 4 times per year    Active Member of [employer] or Organizations: No    Attends Archivist Meetings: Never    Marital Status: Married  Human resources officer Violence: Not At Risk (08/14/2022)   Humiliation, Afraid, Rape, and Kick questionnaire    Fear of Current or Ex-Partner: No    Emotionally Abused: No    Physically Abused: No    Sexually Abused: No    PHYSICAL EXAM  GENERAL EXAM/CONSTITUTIONAL: Vitals:  Vitals:   10/24/22 1031  BP: (!) 117/51  Pulse: 64   There is no height or weight on file to calculate BMI. Wt Readings from Last 3 Encounters:  10/22/22 159 lb (72.1 kg)  10/22/22 159 lb 1.9 oz (72.2 kg)  09/22/22 159 lb 6.3 oz (72.3 kg)   Patient is in no distress; well developed, nourished and groomed; neck is supple, sitting in her wheelchair Glabellar reflex present.   EYES: Visual fields full to confrontation, Extraocular movements intacts,  No results found.  MUSCULOSKELETAL: Gait, strength, tone, movements noted in Neurologic exam below  NEUROLOGIC: MENTAL STATUS:     05/01/2021   11:27 AM  MMSE - Mini Mental State Exam  Not completed: Unable to complete   awake, alert, oriented to person, place and time recent and remote memory intact normal attention and concentration language fluent, comprehension intact, naming intact fund of knowledge appropriate  CRANIAL NERVE:  2nd, 3rd, 4th, 6th - Visual fields full to confrontation, extraocular muscles intact, no nystagmus 5th - facial sensation symmetric 7th - facial strength  symmetric 8th - hearing intact 9th - palate elevates symmetrically, uvula midline 11th - shoulder shrug symmetric 12th - tongue protrusion midline  MOTOR:  normal bulk and tone, full strength in the BUE, BLE. She has bradykinesia and rigidity present.  SENSORY:  normal and symmetric to light touch  COORDINATION:  finger-nose-finger, fine finger movements normal. There is bilateral resting tremors, left worse than right   GAIT/STATION:  Deferred     DIAGNOSTIC DATA (LABS, IMAGING, TESTING) - I reviewed patient records, labs, notes, testing and imaging myself where available.  Lab Results  Component Value Date   WBC 8.4 09/22/2022   HGB 10.0 (L) 09/22/2022   HCT 31.1 (L) 09/22/2022   MCV 79.5 (L) 09/22/2022   PLT 242 09/22/2022      Component Value Date/Time   NA 140 09/22/2022 0734   NA 142 01/02/2022 1029   NA 143 10/12/2014 1001   K 3.3 (L) 09/22/2022 0734   K 3.9 10/12/2014 1001   CL 109 09/22/2022 0734   CO2 21 (L) 09/22/2022 0734   CO2 25 10/12/2014 1001   GLUCOSE 123 (H) 09/22/2022 0734   GLUCOSE 105 10/12/2014 1001   BUN 11 09/22/2022 0734   BUN 30 (H) 01/02/2022 1029   BUN 13.9 10/12/2014 1001   CREATININE 0.86 09/22/2022 0734   CREATININE  1.47 (H) 03/22/2020 1358   CREATININE 1.0 10/12/2014 1001   CALCIUM 8.4 (L) 09/22/2022 0734   CALCIUM 8.6 10/12/2014 1001   PROT 6.2 (L) 09/21/2022 0319   PROT 6.7 01/02/2022 1029   PROT 7.4 10/12/2014 1001   ALBUMIN 2.6 (L) 09/21/2022 0319   ALBUMIN 4.0 01/02/2022 1029   ALBUMIN 3.5 10/12/2014 1001   AST 20 09/21/2022 0319   AST 10 (L) 11/12/2018 1044   AST 33 10/12/2014 1001   ALT 14 09/21/2022 0319   ALT 6 11/12/2018 1044   ALT 27 10/12/2014 1001   ALKPHOS 59 09/21/2022 0319   ALKPHOS 102 10/12/2014 1001   BILITOT 0.4 09/21/2022 0319   BILITOT 0.3 01/02/2022 1029   BILITOT <0.2 (L) 11/12/2018 1044   BILITOT 0.25 10/12/2014 1001   GFRNONAA >60 09/22/2022 0734   GFRNONAA 35 (L) 03/22/2020 1358   GFRAA  43 (L) 08/07/2020 1013   GFRAA 40 (L) 03/22/2020 1358   Lab Results  Component Value Date   CHOL 73 04/18/2022   HDL 18 (L) 04/18/2022   LDLCALC 38 04/18/2022   TRIG 85 04/18/2022   Lab Results  Component Value Date   HGBA1C 5.9 (H) 04/18/2022   Lab Results  Component Value Date   VITAMINB12 356 04/11/2022   Lab Results  Component Value Date   TSH 3.648 04/20/2022    EEG 05/15/22 This study is suggestive moderate diffuse encephalopathy, nonspecific etiology.  No seizures or epileptiform discharges were seen throughout the recording.    MRI Brain 10/17/2022 1. 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 2. Diffuse pachymeningeal thickening and enhancement over both cerebral hemispheres, more prominent over the frontal lobes at the midline measuring up to 7 mm in thickness, favored to reflect evolving postoperative change. Recommend follow-up in 3-6 months to ensure stability.   MRI Brain 03/15/2022 Long-standing planum sphenoidale meningioma with slow growth. Current dimensions are 3 x 3 x 2 cm. Possibly related to the history of seizure, there is new vasogenic edema in the adjacent left frontal lobe.   I personally reviewed brain Images and previous EEG reports.   ASSESSMENT AND PLAN  77 y.o. year old female  with meningioma s/p resection in June, seizure disorder, heart disease who is presenting for further management of her seizures.  She is on Vimpat and Keppra.  Husband reports that every time that he gave the anti seizure medications patient is noted to have shaking all over.  They denies any seizures with the 2 medications. I will check a level and if elevated, will likely reduce it.   On exam today patient was noted to have a tremor, resting tremor, bradykinesia, rigidity, positive glabellar response all consistent with  Parkinson disease.  Her gait was not tested as she has trouble with ambulation.  Husband has also reported REM sleep behavior. Husband said during sleep patient will shout and kick.  I have informed patient that she likely has Parkinson disease and she is adamant that she does not have the disease.  She was told previously by maybe physical therapy or some other provider that she might have Parkinson's but again she is adamant that she does not have it. I will obtain a DaTscan to confirm the diagnosis prior to starting meds. I am afraid if I go ahead and start medication without any imaging finding to support a diagnosis she will not accept  it and not take the medication.  She is a retired Marine scientist.  I will see them in 6 months for follow-up.   1. Seizure disorder ([place])   2. Meningioma ([place])   3. Therapeutic drug monitoring   4. Parkinson's disease without dyskinesia, unspecified whether manifestations fluctuate     Patient Instructions  Continue with Keppra 750 mg twice daily  Continue with Vimpat 200 mg twice daily  Will check a Keppra and Vimpat level  Dat scan to confirm Parkinson disease  Follow up in 6 months or sooner if worse    Per [place] statutes, patients with seizures are not allowed to drive until they have been seizure-free for six months.  Other recommendations include using caution when using heavy equipment or power tools. Avoid working on ladders or at heights. Take showers instead of baths.  Do not swim alone.  Ensure the water temperature is not too high on the home water heater. Do not go swimming alone. Do not lock yourself in a room alone (i.e. bathroom). When caring for infants or small children, sit down when holding, feeding, or changing them to minimize risk of injury to the child in the event you have a seizure. Maintain good sleep hygiene. Avoid alcohol.  Also recommend adequate sleep, hydration, good diet and minimize stress.   During the Seizure  - First,  ensure adequate ventilation and place patients on the floor on their left side  Loosen clothing around the neck and ensure the airway is patent. If the patient is clenching the teeth, do not force the mouth open with any object as this can cause severe damage - Remove all items from the surrounding that can be hazardous. The patient may be oblivious to what's happening and may not even know what he or she is doing. If the patient is confused and wandering, either gently guide him/her away and block access to outside areas - Reassure the individual and be comforting - Call 911. In most cases, the seizure ends before EMS arrives. However, there are cases when seizures may last over 3 to 5 minutes. Or the individual may have developed breathing difficulties or severe injuries. If a pregnant patient or a person with diabetes develops a seizure, it is prudent to call an ambulance. - Finally, if the patient does not regain full consciousness, then call EMS. Most patients will remain confused for about 45 to 90 minutes after a seizure, so you must use judgment in calling for help. - Avoid restraints but make sure the patient is in a bed with padded side rails - Place the individual in a lateral position with the neck slightly flexed; this will help the saliva drain from the mouth and prevent the tongue from falling backward - Remove all nearby furniture and other hazards from the area - Provide verbal assurance as the individual is regaining consciousness - Provide the patient with privacy if possible - Call for help and start treatment as ordered by the caregiver   After the Seizure (Postictal Stage)  After a seizure, most patients experience confusion, fatigue, muscle pain and/or a headache. Thus, one should permit the individual to sleep. For the next few days, reassurance is essential. Being calm and helping reorient the person is also of importance.  Most seizures are painless and end spontaneously.  Seizures are not harmful to others but can lead to complications such as stress on the lungs, brain and the heart. Individuals with prior lung problems may develop  labored breathing and respiratory distress.     Orders Placed This Encounter  Procedures   NM BRAIN DATSCAN TUMOR LOC INFLAM SPECT 1 DAY   Levetiracetam level   Lacosamide    No orders of the defined types were placed in this encounter.   Return in about 6 months (around 04/24/2023).    Alric Ran, MD 10/24/2022, 12:42 PM  [facility] [address] [place], [place] [address] [phone]

## 2022-10-24 NOTE — Patient Instructions (Signed)
Continue with Keppra 750 mg twice daily  Continue with Vimpat 200 mg twice daily  Will check a Keppra and Vimpat level  Dat scan to confirm Parkinson disease  Follow up in 6 months or sooner if worse

## 2022-10-26 DIAGNOSIS — K5900 Constipation, unspecified: Secondary | ICD-10-CM | POA: Insufficient documentation

## 2022-10-26 NOTE — Assessment & Plan Note (Signed)
Currently on no PPI may need to be re instituted, mx per GI

## 2022-10-26 NOTE — Progress Notes (Signed)
   Tara [name]     MRN: [id number]      DOB: [birth year]   HPI Tara [name] is here for follow up and re-evaluation of chronic medical conditions, medication management and review of any available recent lab and radiology data.  Specific concerns from h/h pathologist is that she is vomiting and gagging with purees and liquids , great concern for aspiration, requesting urgent GI coonsult Also concerned regarding reflux , and also c/o constiation, not taking colace regularly Pt and family request stopping zolofy Concerned about abnorml behavior after vipmat, will discuss with neurology a upcoming appt ROS Denies recent fever or chills. Denies sinus pressure, nasal congestion, ear pain or sore throat.    limitation in mobility. Denies headaches, denies seizures, Denies depression, anxiety or insomnia. Denies skin break down or rash.   PE  BP 107/68   Pulse 89   Ht '4\' 9"'$  (1.448 m)   Wt 159 lb (72.1 kg)   SpO2 97%   BMI 34.41 kg/m   Patient alert and oriented communicates but an unreliable historian spouse accompanies her and in no cardiopulmonary distress.  HEENT: No facial asymmetry, EOMI,     Neck decreased ROM Chest: Clear to auscultation bilaterally.  CVS: S1, S2  no S3. ABD: Soft non tender.   Ext: No edema  MS: decreased  ROM spine, shoulders, hips and knees.  Skin: Intact, no ulcerations or rash noted.  Psych: Good eye contact,  not anxious or depressed appearing.  CNS: CN 2-12 intact,  Assessment & Plan  Dysphagia Not tolerating purees and liquids with high aspiration risk urgent gI eval needed, has upcoming appt scheduld  Seizure disorder [facility]) Neurology to determine med management, spouse is convinced that medication makes her sick  Anxiety and depression Pt reports not depressed, family wants to  reduce pill burden and have asked to d/c zoloft in the past, though she has been on this for a long time, I will go ahead wiith trial of d/c zolof , klonopin to  continue  GERD Currently on no PPI may need to be re instituted, mx per GI  Constipation Needs to commit to daily stool softener and laxative every 3 days if needed

## 2022-10-26 NOTE — Assessment & Plan Note (Signed)
Not tolerating purees and liquids with high aspiration risk urgent gI eval needed, has upcoming appt scheduld

## 2022-10-26 NOTE — Assessment & Plan Note (Signed)
Pt reports not depressed, family wants to  reduce pill burden and have asked to d/c zoloft in the past, though she has been on this for a long time, I will go ahead wiith trial of d/c zolof , klonopin to continue

## 2022-10-26 NOTE — Assessment & Plan Note (Signed)
Neurology to determine med management, spouse is convinced that medication makes her sick

## 2022-10-26 NOTE — Assessment & Plan Note (Signed)
Needs to commit to daily stool softener and laxative every 3 days if needed

## 2022-10-28 ENCOUNTER — Other Ambulatory Visit: Payer: Self-pay

## 2022-10-28 DIAGNOSIS — F419 Anxiety disorder, unspecified: Secondary | ICD-10-CM | POA: Diagnosis not present

## 2022-10-28 DIAGNOSIS — G40909 Epilepsy, unspecified, not intractable, without status epilepticus: Secondary | ICD-10-CM | POA: Diagnosis not present

## 2022-10-28 DIAGNOSIS — F32A Depression, unspecified: Secondary | ICD-10-CM | POA: Diagnosis not present

## 2022-10-28 DIAGNOSIS — I255 Ischemic cardiomyopathy: Secondary | ICD-10-CM | POA: Diagnosis not present

## 2022-10-28 DIAGNOSIS — E785 Hyperlipidemia, unspecified: Secondary | ICD-10-CM | POA: Diagnosis not present

## 2022-10-28 DIAGNOSIS — I110 Hypertensive heart disease with heart failure: Secondary | ICD-10-CM | POA: Diagnosis not present

## 2022-10-28 DIAGNOSIS — G7281 Critical illness myopathy: Secondary | ICD-10-CM | POA: Diagnosis not present

## 2022-10-28 DIAGNOSIS — I428 Other cardiomyopathies: Secondary | ICD-10-CM | POA: Diagnosis not present

## 2022-10-28 DIAGNOSIS — D649 Anemia, unspecified: Secondary | ICD-10-CM | POA: Diagnosis not present

## 2022-10-28 DIAGNOSIS — E669 Obesity, unspecified: Secondary | ICD-10-CM | POA: Diagnosis not present

## 2022-10-28 DIAGNOSIS — K219 Gastro-esophageal reflux disease without esophagitis: Secondary | ICD-10-CM | POA: Diagnosis not present

## 2022-10-28 DIAGNOSIS — K449 Diaphragmatic hernia without obstruction or gangrene: Secondary | ICD-10-CM | POA: Diagnosis not present

## 2022-10-28 DIAGNOSIS — R1319 Other dysphagia: Secondary | ICD-10-CM | POA: Diagnosis not present

## 2022-10-28 DIAGNOSIS — I482 Chronic atrial fibrillation, unspecified: Secondary | ICD-10-CM | POA: Diagnosis not present

## 2022-10-28 DIAGNOSIS — I5022 Chronic systolic (congestive) heart failure: Secondary | ICD-10-CM | POA: Diagnosis not present

## 2022-10-28 DIAGNOSIS — Z6838 Body mass index (BMI) 38.0-38.9, adult: Secondary | ICD-10-CM | POA: Diagnosis not present

## 2022-10-28 LAB — LACOSAMIDE: Lacosamide: 40.5 ug/mL — ABNORMAL HIGH (ref 5.0–10.0)

## 2022-10-28 LAB — LEVETIRACETAM LEVEL: Levetiracetam Lvl: 38.1 ug/mL (ref 10.0–40.0)

## 2022-10-28 NOTE — Progress Notes (Signed)
Please call and advise the patient/husband that the recent Vimpat level was very high. Please advise them to decrease to 100 mg (1/2 tablet) twice daily and to continue same dose of Keppra.  Please remind patient to keep any upcoming appointments or tests and to call us with any interim questions, concerns, problems or updates. Thanks,   I have call but no response, unable to leave a message.   Alric Ran, MD

## 2022-10-28 NOTE — Telephone Encounter (Signed)
-----   Message from Alric Ran, MD sent at 10/28/2022  1:17 PM EST ----- Please call and advise the patient/husband that the recent Vimpat level was very high. Please advise them to decrease to 100 mg (1/2 tablet) twice daily and to continue same dose of Keppra.  Please remind patient to keep any upcoming appointments or tests and to call us with any interim questions, concerns, problems or updates. Thanks,   I have call but no response, unable to leave a message.   Alric Ran, MD

## 2022-10-30 DIAGNOSIS — I428 Other cardiomyopathies: Secondary | ICD-10-CM | POA: Diagnosis not present

## 2022-10-30 DIAGNOSIS — R1319 Other dysphagia: Secondary | ICD-10-CM | POA: Diagnosis not present

## 2022-10-30 DIAGNOSIS — D649 Anemia, unspecified: Secondary | ICD-10-CM | POA: Diagnosis not present

## 2022-10-30 DIAGNOSIS — F419 Anxiety disorder, unspecified: Secondary | ICD-10-CM | POA: Diagnosis not present

## 2022-10-30 DIAGNOSIS — K219 Gastro-esophageal reflux disease without esophagitis: Secondary | ICD-10-CM | POA: Diagnosis not present

## 2022-10-30 DIAGNOSIS — F32A Depression, unspecified: Secondary | ICD-10-CM | POA: Diagnosis not present

## 2022-10-30 DIAGNOSIS — I110 Hypertensive heart disease with heart failure: Secondary | ICD-10-CM | POA: Diagnosis not present

## 2022-10-30 DIAGNOSIS — I482 Chronic atrial fibrillation, unspecified: Secondary | ICD-10-CM | POA: Diagnosis not present

## 2022-10-30 DIAGNOSIS — Z6838 Body mass index (BMI) 38.0-38.9, adult: Secondary | ICD-10-CM | POA: Diagnosis not present

## 2022-10-30 DIAGNOSIS — I5022 Chronic systolic (congestive) heart failure: Secondary | ICD-10-CM | POA: Diagnosis not present

## 2022-10-30 DIAGNOSIS — G40909 Epilepsy, unspecified, not intractable, without status epilepticus: Secondary | ICD-10-CM | POA: Diagnosis not present

## 2022-10-30 DIAGNOSIS — G7281 Critical illness myopathy: Secondary | ICD-10-CM | POA: Diagnosis not present

## 2022-10-30 DIAGNOSIS — E785 Hyperlipidemia, unspecified: Secondary | ICD-10-CM | POA: Diagnosis not present

## 2022-10-30 DIAGNOSIS — I255 Ischemic cardiomyopathy: Secondary | ICD-10-CM | POA: Diagnosis not present

## 2022-10-30 DIAGNOSIS — E669 Obesity, unspecified: Secondary | ICD-10-CM | POA: Diagnosis not present

## 2022-10-30 DIAGNOSIS — K449 Diaphragmatic hernia without obstruction or gangrene: Secondary | ICD-10-CM | POA: Diagnosis not present

## 2022-11-03 ENCOUNTER — Other Ambulatory Visit: Payer: Self-pay | Admitting: Family Medicine

## 2022-11-04 ENCOUNTER — Other Ambulatory Visit: Payer: Self-pay | Admitting: Family Medicine

## 2022-11-04 ENCOUNTER — Telehealth: Payer: Self-pay | Admitting: Neurology

## 2022-11-04 DIAGNOSIS — E872 Acidosis, unspecified: Secondary | ICD-10-CM | POA: Diagnosis not present

## 2022-11-04 DIAGNOSIS — N179 Acute kidney failure, unspecified: Secondary | ICD-10-CM | POA: Diagnosis not present

## 2022-11-04 DIAGNOSIS — D649 Anemia, unspecified: Secondary | ICD-10-CM | POA: Diagnosis not present

## 2022-11-04 DIAGNOSIS — E785 Hyperlipidemia, unspecified: Secondary | ICD-10-CM | POA: Diagnosis not present

## 2022-11-04 DIAGNOSIS — I428 Other cardiomyopathies: Secondary | ICD-10-CM | POA: Diagnosis not present

## 2022-11-04 DIAGNOSIS — K449 Diaphragmatic hernia without obstruction or gangrene: Secondary | ICD-10-CM | POA: Diagnosis not present

## 2022-11-04 DIAGNOSIS — G40909 Epilepsy, unspecified, not intractable, without status epilepticus: Secondary | ICD-10-CM | POA: Diagnosis not present

## 2022-11-04 DIAGNOSIS — E669 Obesity, unspecified: Secondary | ICD-10-CM | POA: Diagnosis not present

## 2022-11-04 DIAGNOSIS — Z6838 Body mass index (BMI) 38.0-38.9, adult: Secondary | ICD-10-CM | POA: Diagnosis not present

## 2022-11-04 DIAGNOSIS — I255 Ischemic cardiomyopathy: Secondary | ICD-10-CM | POA: Diagnosis not present

## 2022-11-04 DIAGNOSIS — R1319 Other dysphagia: Secondary | ICD-10-CM | POA: Diagnosis not present

## 2022-11-04 DIAGNOSIS — I5022 Chronic systolic (congestive) heart failure: Secondary | ICD-10-CM | POA: Diagnosis not present

## 2022-11-04 DIAGNOSIS — K219 Gastro-esophageal reflux disease without esophagitis: Secondary | ICD-10-CM | POA: Diagnosis not present

## 2022-11-04 DIAGNOSIS — I482 Chronic atrial fibrillation, unspecified: Secondary | ICD-10-CM | POA: Diagnosis not present

## 2022-11-04 DIAGNOSIS — F419 Anxiety disorder, unspecified: Secondary | ICD-10-CM | POA: Diagnosis not present

## 2022-11-04 DIAGNOSIS — I4891 Unspecified atrial fibrillation: Secondary | ICD-10-CM | POA: Diagnosis not present

## 2022-11-04 DIAGNOSIS — G7281 Critical illness myopathy: Secondary | ICD-10-CM | POA: Diagnosis not present

## 2022-11-04 DIAGNOSIS — I110 Hypertensive heart disease with heart failure: Secondary | ICD-10-CM | POA: Diagnosis not present

## 2022-11-04 DIAGNOSIS — F32A Depression, unspecified: Secondary | ICD-10-CM | POA: Diagnosis not present

## 2022-11-04 NOTE — Telephone Encounter (Signed)
[employer] medicare Josem Kaufmann: [id number] exp. 11/04/22-12/03/22 sent to [facility] nuclear medicine [phone]

## 2022-11-05 ENCOUNTER — Telehealth: Payer: Self-pay | Admitting: Family Medicine

## 2022-11-05 MED ORDER — LACOSAMIDE 10 MG/ML PO SOLN: 100.0000 mg | Freq: Two times a day (BID) | ORAL | 3 refills | Status: DC

## 2022-11-05 NOTE — Telephone Encounter (Signed)
Called daughter back (dpr) to let her know a new prescription had been sent

## 2022-11-05 NOTE — Telephone Encounter (Signed)
Tara [name], nurse case manager called [phone] ever had a suction machine ordered for her?

## 2022-11-05 NOTE — Addendum Note (Signed)
Addended by: Kristen Loader on: 11/05/2022 08:01 AM   Modules accepted: Orders

## 2022-11-06 ENCOUNTER — Other Ambulatory Visit: Payer: Self-pay | Admitting: Family Medicine

## 2022-11-06 DIAGNOSIS — D329 Benign neoplasm of meninges, unspecified: Secondary | ICD-10-CM | POA: Diagnosis not present

## 2022-11-06 DIAGNOSIS — Z1231 Encounter for screening mammogram for malignant neoplasm of breast: Secondary | ICD-10-CM

## 2022-11-06 NOTE — Telephone Encounter (Signed)
Aware and gave case manager the number so copay can be made for equipment

## 2022-11-06 NOTE — Telephone Encounter (Signed)
Thanks

## 2022-11-07 DIAGNOSIS — E669 Obesity, unspecified: Secondary | ICD-10-CM | POA: Diagnosis not present

## 2022-11-07 DIAGNOSIS — R1319 Other dysphagia: Secondary | ICD-10-CM | POA: Diagnosis not present

## 2022-11-07 DIAGNOSIS — K219 Gastro-esophageal reflux disease without esophagitis: Secondary | ICD-10-CM | POA: Diagnosis not present

## 2022-11-07 DIAGNOSIS — I5022 Chronic systolic (congestive) heart failure: Secondary | ICD-10-CM | POA: Diagnosis not present

## 2022-11-07 DIAGNOSIS — I110 Hypertensive heart disease with heart failure: Secondary | ICD-10-CM | POA: Diagnosis not present

## 2022-11-07 DIAGNOSIS — F32A Depression, unspecified: Secondary | ICD-10-CM | POA: Diagnosis not present

## 2022-11-07 DIAGNOSIS — I428 Other cardiomyopathies: Secondary | ICD-10-CM | POA: Diagnosis not present

## 2022-11-07 DIAGNOSIS — E785 Hyperlipidemia, unspecified: Secondary | ICD-10-CM | POA: Diagnosis not present

## 2022-11-07 DIAGNOSIS — Z6838 Body mass index (BMI) 38.0-38.9, adult: Secondary | ICD-10-CM | POA: Diagnosis not present

## 2022-11-07 DIAGNOSIS — F419 Anxiety disorder, unspecified: Secondary | ICD-10-CM | POA: Diagnosis not present

## 2022-11-07 DIAGNOSIS — I255 Ischemic cardiomyopathy: Secondary | ICD-10-CM | POA: Diagnosis not present

## 2022-11-07 DIAGNOSIS — G7281 Critical illness myopathy: Secondary | ICD-10-CM | POA: Diagnosis not present

## 2022-11-07 DIAGNOSIS — I482 Chronic atrial fibrillation, unspecified: Secondary | ICD-10-CM | POA: Diagnosis not present

## 2022-11-07 DIAGNOSIS — D649 Anemia, unspecified: Secondary | ICD-10-CM | POA: Diagnosis not present

## 2022-11-07 DIAGNOSIS — K449 Diaphragmatic hernia without obstruction or gangrene: Secondary | ICD-10-CM | POA: Diagnosis not present

## 2022-11-07 DIAGNOSIS — G40909 Epilepsy, unspecified, not intractable, without status epilepticus: Secondary | ICD-10-CM | POA: Diagnosis not present

## 2022-11-08 DIAGNOSIS — D329 Benign neoplasm of meninges, unspecified: Secondary | ICD-10-CM | POA: Diagnosis not present

## 2022-11-08 DIAGNOSIS — E440 Moderate protein-calorie malnutrition: Secondary | ICD-10-CM | POA: Diagnosis not present

## 2022-11-08 DIAGNOSIS — R1314 Dysphagia, pharyngoesophageal phase: Secondary | ICD-10-CM | POA: Diagnosis not present

## 2022-11-08 DIAGNOSIS — R1312 Dysphagia, oropharyngeal phase: Secondary | ICD-10-CM | POA: Diagnosis not present

## 2022-11-12 DIAGNOSIS — I255 Ischemic cardiomyopathy: Secondary | ICD-10-CM | POA: Diagnosis not present

## 2022-11-12 DIAGNOSIS — E669 Obesity, unspecified: Secondary | ICD-10-CM | POA: Diagnosis not present

## 2022-11-12 DIAGNOSIS — I482 Chronic atrial fibrillation, unspecified: Secondary | ICD-10-CM | POA: Diagnosis not present

## 2022-11-12 DIAGNOSIS — I428 Other cardiomyopathies: Secondary | ICD-10-CM | POA: Diagnosis not present

## 2022-11-12 DIAGNOSIS — G7281 Critical illness myopathy: Secondary | ICD-10-CM | POA: Diagnosis not present

## 2022-11-12 DIAGNOSIS — I110 Hypertensive heart disease with heart failure: Secondary | ICD-10-CM | POA: Diagnosis not present

## 2022-11-12 DIAGNOSIS — G40909 Epilepsy, unspecified, not intractable, without status epilepticus: Secondary | ICD-10-CM | POA: Diagnosis not present

## 2022-11-12 DIAGNOSIS — F32A Depression, unspecified: Secondary | ICD-10-CM | POA: Diagnosis not present

## 2022-11-12 DIAGNOSIS — Z6838 Body mass index (BMI) 38.0-38.9, adult: Secondary | ICD-10-CM | POA: Diagnosis not present

## 2022-11-12 DIAGNOSIS — F419 Anxiety disorder, unspecified: Secondary | ICD-10-CM | POA: Diagnosis not present

## 2022-11-12 DIAGNOSIS — K449 Diaphragmatic hernia without obstruction or gangrene: Secondary | ICD-10-CM | POA: Diagnosis not present

## 2022-11-12 DIAGNOSIS — R1319 Other dysphagia: Secondary | ICD-10-CM | POA: Diagnosis not present

## 2022-11-12 DIAGNOSIS — I5022 Chronic systolic (congestive) heart failure: Secondary | ICD-10-CM | POA: Diagnosis not present

## 2022-11-12 DIAGNOSIS — E785 Hyperlipidemia, unspecified: Secondary | ICD-10-CM | POA: Diagnosis not present

## 2022-11-12 DIAGNOSIS — D649 Anemia, unspecified: Secondary | ICD-10-CM | POA: Diagnosis not present

## 2022-11-12 DIAGNOSIS — K219 Gastro-esophageal reflux disease without esophagitis: Secondary | ICD-10-CM | POA: Diagnosis not present

## 2022-11-13 ENCOUNTER — Other Ambulatory Visit: Payer: Self-pay | Admitting: Neurology

## 2022-11-13 DIAGNOSIS — F419 Anxiety disorder, unspecified: Secondary | ICD-10-CM | POA: Diagnosis not present

## 2022-11-13 DIAGNOSIS — N179 Acute kidney failure, unspecified: Secondary | ICD-10-CM | POA: Diagnosis not present

## 2022-11-13 DIAGNOSIS — E872 Acidosis, unspecified: Secondary | ICD-10-CM | POA: Diagnosis not present

## 2022-11-13 DIAGNOSIS — I4891 Unspecified atrial fibrillation: Secondary | ICD-10-CM | POA: Diagnosis not present

## 2022-11-13 MED ORDER — LACOSAMIDE 100 MG PO TABS: 100.0000 mg | ORAL_TABLET | Freq: Two times a day (BID) | ORAL | 5 refills | Status: DC

## 2022-11-13 MED ORDER — LEVETIRACETAM 750 MG PO TABS: 750.0000 mg | ORAL_TABLET | Freq: Two times a day (BID) | ORAL | 11 refills | Status: DC

## 2022-11-13 NOTE — Telephone Encounter (Signed)
Switched to pill forms. Thanks

## 2022-11-19 DIAGNOSIS — Z8701 Personal history of pneumonia (recurrent): Secondary | ICD-10-CM | POA: Diagnosis not present

## 2022-11-19 DIAGNOSIS — Z86010 Personal history of colonic polyps: Secondary | ICD-10-CM | POA: Diagnosis not present

## 2022-11-19 DIAGNOSIS — E669 Obesity, unspecified: Secondary | ICD-10-CM | POA: Diagnosis not present

## 2022-11-19 DIAGNOSIS — Z8611 Personal history of tuberculosis: Secondary | ICD-10-CM | POA: Diagnosis not present

## 2022-11-19 DIAGNOSIS — G7281 Critical illness myopathy: Secondary | ICD-10-CM | POA: Diagnosis not present

## 2022-11-19 DIAGNOSIS — G40909 Epilepsy, unspecified, not intractable, without status epilepticus: Secondary | ICD-10-CM | POA: Diagnosis not present

## 2022-11-19 DIAGNOSIS — R1319 Other dysphagia: Secondary | ICD-10-CM | POA: Diagnosis not present

## 2022-11-19 DIAGNOSIS — Z86011 Personal history of benign neoplasm of the brain: Secondary | ICD-10-CM | POA: Diagnosis not present

## 2022-11-19 DIAGNOSIS — Z9181 History of falling: Secondary | ICD-10-CM | POA: Diagnosis not present

## 2022-11-19 DIAGNOSIS — I255 Ischemic cardiomyopathy: Secondary | ICD-10-CM | POA: Diagnosis not present

## 2022-11-19 DIAGNOSIS — F32A Depression, unspecified: Secondary | ICD-10-CM | POA: Diagnosis not present

## 2022-11-19 DIAGNOSIS — I110 Hypertensive heart disease with heart failure: Secondary | ICD-10-CM | POA: Diagnosis not present

## 2022-11-19 DIAGNOSIS — I482 Chronic atrial fibrillation, unspecified: Secondary | ICD-10-CM | POA: Diagnosis not present

## 2022-11-19 DIAGNOSIS — Z431 Encounter for attention to gastrostomy: Secondary | ICD-10-CM | POA: Diagnosis not present

## 2022-11-19 DIAGNOSIS — E785 Hyperlipidemia, unspecified: Secondary | ICD-10-CM | POA: Diagnosis not present

## 2022-11-19 DIAGNOSIS — Z853 Personal history of malignant neoplasm of breast: Secondary | ICD-10-CM | POA: Diagnosis not present

## 2022-11-19 DIAGNOSIS — Z6838 Body mass index (BMI) 38.0-38.9, adult: Secondary | ICD-10-CM | POA: Diagnosis not present

## 2022-11-19 DIAGNOSIS — K449 Diaphragmatic hernia without obstruction or gangrene: Secondary | ICD-10-CM | POA: Diagnosis not present

## 2022-11-19 DIAGNOSIS — F419 Anxiety disorder, unspecified: Secondary | ICD-10-CM | POA: Diagnosis not present

## 2022-11-19 DIAGNOSIS — D649 Anemia, unspecified: Secondary | ICD-10-CM | POA: Diagnosis not present

## 2022-11-19 DIAGNOSIS — K219 Gastro-esophageal reflux disease without esophagitis: Secondary | ICD-10-CM | POA: Diagnosis not present

## 2022-11-19 DIAGNOSIS — Z7901 Long term (current) use of anticoagulants: Secondary | ICD-10-CM | POA: Diagnosis not present

## 2022-11-19 DIAGNOSIS — I5022 Chronic systolic (congestive) heart failure: Secondary | ICD-10-CM | POA: Diagnosis not present

## 2022-11-19 DIAGNOSIS — I428 Other cardiomyopathies: Secondary | ICD-10-CM | POA: Diagnosis not present

## 2022-11-19 DIAGNOSIS — Z9011 Acquired absence of right breast and nipple: Secondary | ICD-10-CM | POA: Diagnosis not present

## 2022-11-20 DIAGNOSIS — D649 Anemia, unspecified: Secondary | ICD-10-CM | POA: Diagnosis not present

## 2022-11-20 DIAGNOSIS — K219 Gastro-esophageal reflux disease without esophagitis: Secondary | ICD-10-CM | POA: Diagnosis not present

## 2022-11-20 DIAGNOSIS — G7281 Critical illness myopathy: Secondary | ICD-10-CM | POA: Diagnosis not present

## 2022-11-20 DIAGNOSIS — E785 Hyperlipidemia, unspecified: Secondary | ICD-10-CM | POA: Diagnosis not present

## 2022-11-20 DIAGNOSIS — I482 Chronic atrial fibrillation, unspecified: Secondary | ICD-10-CM | POA: Diagnosis not present

## 2022-11-20 DIAGNOSIS — I428 Other cardiomyopathies: Secondary | ICD-10-CM | POA: Diagnosis not present

## 2022-11-20 DIAGNOSIS — R1319 Other dysphagia: Secondary | ICD-10-CM | POA: Diagnosis not present

## 2022-11-20 DIAGNOSIS — E669 Obesity, unspecified: Secondary | ICD-10-CM | POA: Diagnosis not present

## 2022-11-20 DIAGNOSIS — Z6838 Body mass index (BMI) 38.0-38.9, adult: Secondary | ICD-10-CM | POA: Diagnosis not present

## 2022-11-20 DIAGNOSIS — F419 Anxiety disorder, unspecified: Secondary | ICD-10-CM | POA: Diagnosis not present

## 2022-11-20 DIAGNOSIS — I255 Ischemic cardiomyopathy: Secondary | ICD-10-CM | POA: Diagnosis not present

## 2022-11-20 DIAGNOSIS — I110 Hypertensive heart disease with heart failure: Secondary | ICD-10-CM | POA: Diagnosis not present

## 2022-11-20 DIAGNOSIS — F32A Depression, unspecified: Secondary | ICD-10-CM | POA: Diagnosis not present

## 2022-11-20 DIAGNOSIS — K449 Diaphragmatic hernia without obstruction or gangrene: Secondary | ICD-10-CM | POA: Diagnosis not present

## 2022-11-20 DIAGNOSIS — G40909 Epilepsy, unspecified, not intractable, without status epilepticus: Secondary | ICD-10-CM | POA: Diagnosis not present

## 2022-11-20 DIAGNOSIS — I5042 Chronic combined systolic (congestive) and diastolic (congestive) heart failure: Secondary | ICD-10-CM | POA: Diagnosis not present

## 2022-11-20 DIAGNOSIS — I5022 Chronic systolic (congestive) heart failure: Secondary | ICD-10-CM | POA: Diagnosis not present

## 2022-11-24 DIAGNOSIS — R1314 Dysphagia, pharyngoesophageal phase: Secondary | ICD-10-CM | POA: Diagnosis not present

## 2022-11-24 DIAGNOSIS — E440 Moderate protein-calorie malnutrition: Secondary | ICD-10-CM | POA: Diagnosis not present

## 2022-11-24 DIAGNOSIS — R1312 Dysphagia, oropharyngeal phase: Secondary | ICD-10-CM | POA: Diagnosis not present

## 2022-11-24 DIAGNOSIS — D329 Benign neoplasm of meninges, unspecified: Secondary | ICD-10-CM | POA: Diagnosis not present

## 2022-11-25 DIAGNOSIS — D649 Anemia, unspecified: Secondary | ICD-10-CM | POA: Diagnosis not present

## 2022-11-25 DIAGNOSIS — I255 Ischemic cardiomyopathy: Secondary | ICD-10-CM | POA: Diagnosis not present

## 2022-11-25 DIAGNOSIS — I428 Other cardiomyopathies: Secondary | ICD-10-CM | POA: Diagnosis not present

## 2022-11-25 DIAGNOSIS — I110 Hypertensive heart disease with heart failure: Secondary | ICD-10-CM | POA: Diagnosis not present

## 2022-11-25 DIAGNOSIS — K219 Gastro-esophageal reflux disease without esophagitis: Secondary | ICD-10-CM | POA: Diagnosis not present

## 2022-11-25 DIAGNOSIS — I482 Chronic atrial fibrillation, unspecified: Secondary | ICD-10-CM | POA: Diagnosis not present

## 2022-11-25 DIAGNOSIS — Z6838 Body mass index (BMI) 38.0-38.9, adult: Secondary | ICD-10-CM | POA: Diagnosis not present

## 2022-11-25 DIAGNOSIS — R1319 Other dysphagia: Secondary | ICD-10-CM | POA: Diagnosis not present

## 2022-11-25 DIAGNOSIS — F419 Anxiety disorder, unspecified: Secondary | ICD-10-CM | POA: Diagnosis not present

## 2022-11-25 DIAGNOSIS — E785 Hyperlipidemia, unspecified: Secondary | ICD-10-CM | POA: Diagnosis not present

## 2022-11-25 DIAGNOSIS — I5022 Chronic systolic (congestive) heart failure: Secondary | ICD-10-CM | POA: Diagnosis not present

## 2022-11-25 DIAGNOSIS — F32A Depression, unspecified: Secondary | ICD-10-CM | POA: Diagnosis not present

## 2022-11-25 DIAGNOSIS — E669 Obesity, unspecified: Secondary | ICD-10-CM | POA: Diagnosis not present

## 2022-11-25 DIAGNOSIS — K449 Diaphragmatic hernia without obstruction or gangrene: Secondary | ICD-10-CM | POA: Diagnosis not present

## 2022-11-25 DIAGNOSIS — G7281 Critical illness myopathy: Secondary | ICD-10-CM | POA: Diagnosis not present

## 2022-11-25 DIAGNOSIS — G40909 Epilepsy, unspecified, not intractable, without status epilepticus: Secondary | ICD-10-CM | POA: Diagnosis not present

## 2022-12-01 DIAGNOSIS — K080 Exfoliation of teeth due to systemic causes: Secondary | ICD-10-CM | POA: Diagnosis not present

## 2022-12-03 DIAGNOSIS — N179 Acute kidney failure, unspecified: Secondary | ICD-10-CM | POA: Diagnosis not present

## 2022-12-03 DIAGNOSIS — F419 Anxiety disorder, unspecified: Secondary | ICD-10-CM | POA: Diagnosis not present

## 2022-12-03 DIAGNOSIS — E872 Acidosis, unspecified: Secondary | ICD-10-CM | POA: Diagnosis not present

## 2022-12-03 DIAGNOSIS — I4891 Unspecified atrial fibrillation: Secondary | ICD-10-CM | POA: Diagnosis not present

## 2022-12-04 ENCOUNTER — Other Ambulatory Visit: Payer: Self-pay | Admitting: Family Medicine

## 2022-12-04 ENCOUNTER — Other Ambulatory Visit: Payer: Self-pay | Admitting: Neurology

## 2022-12-04 ENCOUNTER — Encounter (HOSPITAL_COMMUNITY)
Admission: RE | Admit: 2022-12-04 | Discharge: 2022-12-04 | Disposition: A | Discharge status: Home / Self Care | Payer: Medicare Other | Source: Ambulatory Visit | Attending: Neurology | Admitting: Neurology

## 2022-12-04 DIAGNOSIS — G20A1 Parkinson's disease without dyskinesia, without mention of fluctuations: Secondary | ICD-10-CM | POA: Diagnosis not present

## 2022-12-04 DIAGNOSIS — G20C Parkinsonism, unspecified: Secondary | ICD-10-CM | POA: Diagnosis not present

## 2022-12-04 MED ORDER — IOFLUPANE I 123 185 MBQ/2.5ML IV SOLN
4.3000 | Freq: Once | INTRAVENOUS | Status: AC | PRN
  Administered 2022-12-04: 4.3 via INTRAVENOUS
  Filled 2022-12-04: qty 5

## 2022-12-04 MED ORDER — POTASSIUM IODIDE (ANTIDOTE) 130 MG PO TABS
ORAL_TABLET | ORAL | Status: AC
  Administered 2022-12-04: 130 mg via ORAL
  Filled 2022-12-04: qty 1

## 2022-12-04 MED ORDER — POTASSIUM IODIDE (ANTIDOTE) 130 MG PO TABS: 130.0000 mg | ORAL_TABLET | Freq: Once | ORAL | Status: DC

## 2022-12-09 DIAGNOSIS — Z6838 Body mass index (BMI) 38.0-38.9, adult: Secondary | ICD-10-CM | POA: Diagnosis not present

## 2022-12-09 DIAGNOSIS — G7281 Critical illness myopathy: Secondary | ICD-10-CM | POA: Diagnosis not present

## 2022-12-09 DIAGNOSIS — D649 Anemia, unspecified: Secondary | ICD-10-CM | POA: Diagnosis not present

## 2022-12-09 DIAGNOSIS — E669 Obesity, unspecified: Secondary | ICD-10-CM | POA: Diagnosis not present

## 2022-12-09 DIAGNOSIS — I5022 Chronic systolic (congestive) heart failure: Secondary | ICD-10-CM | POA: Diagnosis not present

## 2022-12-09 DIAGNOSIS — G40909 Epilepsy, unspecified, not intractable, without status epilepticus: Secondary | ICD-10-CM | POA: Diagnosis not present

## 2022-12-09 DIAGNOSIS — F32A Depression, unspecified: Secondary | ICD-10-CM | POA: Diagnosis not present

## 2022-12-09 DIAGNOSIS — K449 Diaphragmatic hernia without obstruction or gangrene: Secondary | ICD-10-CM | POA: Diagnosis not present

## 2022-12-09 DIAGNOSIS — I110 Hypertensive heart disease with heart failure: Secondary | ICD-10-CM | POA: Diagnosis not present

## 2022-12-09 DIAGNOSIS — I255 Ischemic cardiomyopathy: Secondary | ICD-10-CM | POA: Diagnosis not present

## 2022-12-09 DIAGNOSIS — K219 Gastro-esophageal reflux disease without esophagitis: Secondary | ICD-10-CM | POA: Diagnosis not present

## 2022-12-09 DIAGNOSIS — I482 Chronic atrial fibrillation, unspecified: Secondary | ICD-10-CM | POA: Diagnosis not present

## 2022-12-09 DIAGNOSIS — E785 Hyperlipidemia, unspecified: Secondary | ICD-10-CM | POA: Diagnosis not present

## 2022-12-09 DIAGNOSIS — R1319 Other dysphagia: Secondary | ICD-10-CM | POA: Diagnosis not present

## 2022-12-09 DIAGNOSIS — F419 Anxiety disorder, unspecified: Secondary | ICD-10-CM | POA: Diagnosis not present

## 2022-12-09 DIAGNOSIS — I428 Other cardiomyopathies: Secondary | ICD-10-CM | POA: Diagnosis not present

## 2022-12-10 DIAGNOSIS — R1314 Dysphagia, pharyngoesophageal phase: Secondary | ICD-10-CM | POA: Diagnosis not present

## 2022-12-10 DIAGNOSIS — D329 Benign neoplasm of meninges, unspecified: Secondary | ICD-10-CM | POA: Diagnosis not present

## 2022-12-10 DIAGNOSIS — R1312 Dysphagia, oropharyngeal phase: Secondary | ICD-10-CM | POA: Diagnosis not present

## 2022-12-10 DIAGNOSIS — E440 Moderate protein-calorie malnutrition: Secondary | ICD-10-CM | POA: Diagnosis not present

## 2022-12-12 DIAGNOSIS — I4891 Unspecified atrial fibrillation: Secondary | ICD-10-CM | POA: Diagnosis not present

## 2022-12-12 DIAGNOSIS — N179 Acute kidney failure, unspecified: Secondary | ICD-10-CM | POA: Diagnosis not present

## 2022-12-12 DIAGNOSIS — J690 Pneumonitis due to inhalation of food and vomit: Secondary | ICD-10-CM | POA: Diagnosis not present

## 2022-12-12 DIAGNOSIS — E872 Acidosis, unspecified: Secondary | ICD-10-CM | POA: Diagnosis not present

## 2022-12-12 DIAGNOSIS — F419 Anxiety disorder, unspecified: Secondary | ICD-10-CM | POA: Diagnosis not present

## 2022-12-12 DIAGNOSIS — J9601 Acute respiratory failure with hypoxia: Secondary | ICD-10-CM | POA: Diagnosis not present

## 2022-12-22 DIAGNOSIS — K080 Exfoliation of teeth due to systemic causes: Secondary | ICD-10-CM | POA: Diagnosis not present

## 2022-12-25 DIAGNOSIS — Z79899 Other long term (current) drug therapy: Secondary | ICD-10-CM | POA: Diagnosis not present

## 2022-12-25 DIAGNOSIS — K224 Dyskinesia of esophagus: Secondary | ICD-10-CM | POA: Diagnosis not present

## 2022-12-25 DIAGNOSIS — G20A1 Parkinson's disease without dyskinesia, without mention of fluctuations: Secondary | ICD-10-CM | POA: Diagnosis not present

## 2022-12-25 DIAGNOSIS — Z888 Allergy status to other drugs, medicaments and biological substances status: Secondary | ICD-10-CM | POA: Diagnosis not present

## 2022-12-25 DIAGNOSIS — Z9981 Dependence on supplemental oxygen: Secondary | ICD-10-CM | POA: Diagnosis not present

## 2022-12-25 DIAGNOSIS — J9611 Chronic respiratory failure with hypoxia: Secondary | ICD-10-CM | POA: Diagnosis not present

## 2022-12-25 DIAGNOSIS — I110 Hypertensive heart disease with heart failure: Secondary | ICD-10-CM | POA: Diagnosis not present

## 2022-12-25 DIAGNOSIS — K311 Adult hypertrophic pyloric stenosis: Secondary | ICD-10-CM | POA: Diagnosis not present

## 2022-12-25 DIAGNOSIS — Y738 Miscellaneous gastroenterology and urology devices associated with adverse incidents, not elsewhere classified: Secondary | ICD-10-CM | POA: Diagnosis not present

## 2022-12-25 DIAGNOSIS — Y838 Other surgical procedures as the cause of abnormal reaction of the patient, or of later complication, without mention of misadventure at the time of the procedure: Secondary | ICD-10-CM | POA: Diagnosis not present

## 2022-12-25 DIAGNOSIS — Z934 Other artificial openings of gastrointestinal tract status: Secondary | ICD-10-CM | POA: Diagnosis not present

## 2022-12-25 DIAGNOSIS — E669 Obesity, unspecified: Secondary | ICD-10-CM | POA: Diagnosis not present

## 2022-12-25 DIAGNOSIS — Z7901 Long term (current) use of anticoagulants: Secondary | ICD-10-CM | POA: Diagnosis not present

## 2022-12-25 DIAGNOSIS — K449 Diaphragmatic hernia without obstruction or gangrene: Secondary | ICD-10-CM | POA: Diagnosis not present

## 2022-12-25 DIAGNOSIS — I5022 Chronic systolic (congestive) heart failure: Secondary | ICD-10-CM | POA: Diagnosis not present

## 2022-12-25 DIAGNOSIS — Z886 Allergy status to analgesic agent status: Secondary | ICD-10-CM | POA: Diagnosis not present

## 2022-12-25 DIAGNOSIS — I482 Chronic atrial fibrillation, unspecified: Secondary | ICD-10-CM | POA: Diagnosis not present

## 2022-12-25 DIAGNOSIS — K279 Peptic ulcer, site unspecified, unspecified as acute or chronic, without hemorrhage or perforation: Secondary | ICD-10-CM | POA: Diagnosis not present

## 2022-12-25 DIAGNOSIS — R569 Unspecified convulsions: Secondary | ICD-10-CM | POA: Diagnosis not present

## 2022-12-25 DIAGNOSIS — K219 Gastro-esophageal reflux disease without esophagitis: Secondary | ICD-10-CM | POA: Diagnosis not present

## 2022-12-25 DIAGNOSIS — R109 Unspecified abdominal pain: Secondary | ICD-10-CM | POA: Diagnosis not present

## 2022-12-25 DIAGNOSIS — K3184 Gastroparesis: Secondary | ICD-10-CM | POA: Diagnosis not present

## 2022-12-25 DIAGNOSIS — T85528A Displacement of other gastrointestinal prosthetic devices, implants and grafts, initial encounter: Secondary | ICD-10-CM | POA: Diagnosis not present

## 2022-12-25 DIAGNOSIS — I509 Heart failure, unspecified: Secondary | ICD-10-CM | POA: Diagnosis not present

## 2022-12-25 DIAGNOSIS — R1312 Dysphagia, oropharyngeal phase: Secondary | ICD-10-CM | POA: Diagnosis not present

## 2022-12-25 DIAGNOSIS — Z6833 Body mass index (BMI) 33.0-33.9, adult: Secondary | ICD-10-CM | POA: Diagnosis not present

## 2022-12-25 DIAGNOSIS — K9423 Gastrostomy malfunction: Secondary | ICD-10-CM | POA: Diagnosis not present

## 2022-12-29 DIAGNOSIS — K080 Exfoliation of teeth due to systemic causes: Secondary | ICD-10-CM | POA: Diagnosis not present

## 2022-12-31 ENCOUNTER — Encounter: Payer: Self-pay | Admitting: Family Medicine

## 2022-12-31 ENCOUNTER — Ambulatory Visit (INDEPENDENT_AMBULATORY_CARE_PROVIDER_SITE_OTHER): Payer: Medicare Other | Admitting: Family Medicine

## 2022-12-31 VITALS — BP 137/83 | HR 78 | Ht <= 58 in | Wt 152.0 lb

## 2022-12-31 DIAGNOSIS — R569 Unspecified convulsions: Secondary | ICD-10-CM

## 2022-12-31 DIAGNOSIS — Z7409 Other reduced mobility: Secondary | ICD-10-CM | POA: Diagnosis not present

## 2022-12-31 DIAGNOSIS — I10 Essential (primary) hypertension: Secondary | ICD-10-CM | POA: Diagnosis not present

## 2022-12-31 DIAGNOSIS — I428 Other cardiomyopathies: Secondary | ICD-10-CM

## 2022-12-31 DIAGNOSIS — N1831 Chronic kidney disease, stage 3a: Secondary | ICD-10-CM

## 2022-12-31 DIAGNOSIS — E559 Vitamin D deficiency, unspecified: Secondary | ICD-10-CM | POA: Diagnosis not present

## 2022-12-31 DIAGNOSIS — R7302 Impaired glucose tolerance (oral): Secondary | ICD-10-CM

## 2022-12-31 DIAGNOSIS — R7301 Impaired fasting glucose: Secondary | ICD-10-CM | POA: Diagnosis not present

## 2022-12-31 DIAGNOSIS — Z789 Other specified health status: Secondary | ICD-10-CM

## 2022-12-31 DIAGNOSIS — F324 Major depressive disorder, single episode, in partial remission: Secondary | ICD-10-CM

## 2022-12-31 DIAGNOSIS — E785 Hyperlipidemia, unspecified: Secondary | ICD-10-CM

## 2022-12-31 DIAGNOSIS — Z13220 Encounter for screening for lipoid disorders: Secondary | ICD-10-CM | POA: Diagnosis not present

## 2022-12-31 DIAGNOSIS — R1312 Dysphagia, oropharyngeal phase: Secondary | ICD-10-CM

## 2022-12-31 DIAGNOSIS — E440 Moderate protein-calorie malnutrition: Secondary | ICD-10-CM

## 2022-12-31 DIAGNOSIS — I5022 Chronic systolic (congestive) heart failure: Secondary | ICD-10-CM

## 2022-12-31 DIAGNOSIS — G40909 Epilepsy, unspecified, not intractable, without status epilepticus: Secondary | ICD-10-CM

## 2022-12-31 NOTE — Patient Instructions (Addendum)
Annual exam in 3 months, call if you need me sooner  Thankful you are doing much better  Labs today CBC, lipid, cmp and EGFr, TSH and vit D and HBA1C ( IGT)  If you are diabetic ins will pay for glucometer  You are referred toPT for twice weekly therapy for 6 weeks  I will refer you for cardiology follow up   Thanks for choosing [facility], we consider it a privelige to serve you.

## 2023-01-01 ENCOUNTER — Inpatient Hospital Stay: Admission: RE | Admit: 2023-01-01 | Payer: Medicare Other | Source: Ambulatory Visit

## 2023-01-01 ENCOUNTER — Other Ambulatory Visit: Payer: Self-pay | Admitting: Family Medicine

## 2023-01-01 DIAGNOSIS — D6869 Other thrombophilia: Secondary | ICD-10-CM | POA: Diagnosis not present

## 2023-01-01 DIAGNOSIS — I5042 Chronic combined systolic (congestive) and diastolic (congestive) heart failure: Secondary | ICD-10-CM | POA: Diagnosis not present

## 2023-01-01 DIAGNOSIS — E440 Moderate protein-calorie malnutrition: Secondary | ICD-10-CM | POA: Diagnosis not present

## 2023-01-01 DIAGNOSIS — I480 Paroxysmal atrial fibrillation: Secondary | ICD-10-CM | POA: Diagnosis not present

## 2023-01-01 LAB — LIPID PANEL
Chol/HDL Ratio: 2.6 ratio (ref 0.0–4.4)
Cholesterol, Total: 178 mg/dL (ref 100–199)
HDL: 69 mg/dL (ref >39)
LDL Chol Calc (NIH): 94 mg/dL (ref 0–99)
Triglycerides: 81 mg/dL (ref 0–149)
VLDL Cholesterol Cal: 15 mg/dL (ref 5–40)

## 2023-01-01 LAB — CMP14+EGFR
ALT: 18 IU/L (ref 0–32)
AST: 24 IU/L (ref 0–40)
Albumin/Globulin Ratio: 1.1 — ABNORMAL LOW (ref 1.2–2.2)
Albumin: 3.8 g/dL (ref 3.8–4.8)
Alkaline Phosphatase: 117 IU/L (ref 44–121)
BUN/Creatinine Ratio: 13 (ref 12–28)
BUN: 15 mg/dL (ref 8–27)
Bilirubin Total: 0.4 mg/dL (ref 0.0–1.2)
CO2: 22 mmol/L (ref 20–29)
Calcium: 9.3 mg/dL (ref 8.7–10.3)
Chloride: 105 mmol/L (ref 96–106)
Creatinine, Ser: 1.18 mg/dL — ABNORMAL HIGH (ref 0.57–1.00)
Globulin, Total: 3.6 g/dL (ref 1.5–4.5)
Glucose: 86 mg/dL (ref 70–99)
Potassium: 4.4 mmol/L (ref 3.5–5.2)
Sodium: 141 mmol/L (ref 134–144)
Total Protein: 7.4 g/dL (ref 6.0–8.5)
eGFR: 48 mL/min/{1.73_m2} — ABNORMAL LOW (ref >59)

## 2023-01-01 LAB — CBC
Hematocrit: 37.1 % (ref 34.0–46.6)
Hemoglobin: 11.4 g/dL (ref 11.1–15.9)
MCH: 25.2 pg — ABNORMAL LOW (ref 26.6–33.0)
MCHC: 30.7 g/dL — ABNORMAL LOW (ref 31.5–35.7)
MCV: 82 fL (ref 79–97)
Platelets: 291 10*3/uL (ref 150–450)
RBC: 4.53 x10E6/uL (ref 3.77–5.28)
RDW: 16.1 % — ABNORMAL HIGH (ref 11.7–15.4)
WBC: 8.1 10*3/uL (ref 3.4–10.8)

## 2023-01-01 LAB — VITAMIN D 25 HYDROXY (VIT D DEFICIENCY, FRACTURES): Vit D, 25-Hydroxy: 40.3 ng/mL (ref 30.0–100.0)

## 2023-01-01 LAB — HEMOGLOBIN A1C
Est. average glucose Bld gHb Est-mCnc: 128 mg/dL
Hgb A1c MFr Bld: 6.1 % — ABNORMAL HIGH (ref 4.8–5.6)

## 2023-01-01 LAB — TSH: TSH: 2.42 u[IU]/mL (ref 0.450–4.500)

## 2023-01-03 DIAGNOSIS — I4891 Unspecified atrial fibrillation: Secondary | ICD-10-CM | POA: Diagnosis not present

## 2023-01-03 DIAGNOSIS — F419 Anxiety disorder, unspecified: Secondary | ICD-10-CM | POA: Diagnosis not present

## 2023-01-03 DIAGNOSIS — N179 Acute kidney failure, unspecified: Secondary | ICD-10-CM | POA: Diagnosis not present

## 2023-01-03 DIAGNOSIS — E872 Acidosis, unspecified: Secondary | ICD-10-CM | POA: Diagnosis not present

## 2023-01-05 DIAGNOSIS — K080 Exfoliation of teeth due to systemic causes: Secondary | ICD-10-CM | POA: Diagnosis not present

## 2023-01-05 DIAGNOSIS — R7302 Impaired glucose tolerance (oral): Secondary | ICD-10-CM | POA: Insufficient documentation

## 2023-01-05 DIAGNOSIS — I428 Other cardiomyopathies: Secondary | ICD-10-CM | POA: Insufficient documentation

## 2023-01-05 DIAGNOSIS — Z789 Other specified health status: Secondary | ICD-10-CM | POA: Insufficient documentation

## 2023-01-05 NOTE — Assessment & Plan Note (Signed)
Hyperlipidemia:Low fat diet discussed and encouraged.   Lipid Panel  Lab Results  Component Value Date   CHOL 178 12/31/2022   HDL 69 12/31/2022   LDLCALC 94 12/31/2022   TRIG 81 12/31/2022   CHOLHDL 2.6 12/31/2022     Controlled, no change in medication

## 2023-01-05 NOTE — Assessment & Plan Note (Signed)
improved

## 2023-01-05 NOTE — Assessment & Plan Note (Signed)
Stable , no s/s of decompensation 

## 2023-01-05 NOTE — Assessment & Plan Note (Signed)
Annual cardiology f/u past due will refer, currently stable

## 2023-01-05 NOTE — Assessment & Plan Note (Signed)
Controlled, no change in medication  

## 2023-01-05 NOTE — Assessment & Plan Note (Signed)
Updated lab shows stability as compared to 3 months prior

## 2023-01-05 NOTE — Assessment & Plan Note (Signed)
Improved , now able to swallow regular diet

## 2023-01-05 NOTE — Progress Notes (Signed)
TANEAH [name]     MRN: [id number]      DOB: [birth year]   HPI Tara [name] is here for follow up and re-evaluation of chronic medical conditions, medication management and review of any available recent lab and radiology data.  Still requires 24/7 assistance including with ADL's. In home PT has been completed, spouse and family requesting out pt PT to see if mobility and independence can be further improved  Ms Tara [name] herself states that she wants  to be able to cook and clean  for her husn=band again Swallows with no difficulty, no choking, regular diet, bowel movements are normal, no skin breakdown noted, no pain with urination or malodorous urine Family requesting prescription for glucometer , however not diabetic, so advised will need to purchase independently  ROS Denies recent fever or chills. Denies sinus pressure, nasal congestion, ear pain or sore throat. Denies chest congestion, productive cough or wheezing. Denies chest pains, palpitations and leg swelling Denies abdominal pain, nausea, vomiting,diarrhea or constipation.   Denies dysuria, frequency, hesitancy or incontinence. . Denies depression, anxiety or insomnia. Denies skin break down or rash.   PE  BP 137/83 (BP Location: Right Arm, Patient Position: Sitting, Cuff Size: Large)   Pulse 78   Ht 4\' 9"  (1.448 m)   Wt 152 lb 0.6 oz (69 kg)   SpO2 90%   BMI 32.90 kg/m   Patient alert and oriented and in no cardiopulmonary distress.  HEENT: No facial asymmetry, EOMI,     Neck decreased ROM Chest: Clear to auscultation bilaterally.  CVS: S1, S2 no murmurs, no S3.Regular rate.  ABD: Soft non tender.   Ext: No edema  MS: decreased  ROM spine, shoulders, hips and knees.  Skin: Intact, no ulcerations or rash noted.  Psych: Good eye contact, normal affect.  not anxious or depressed appearing.  CNS: CN 2-12 intact, decreased power and tone throughout, grade 4  Assessment/ Plan  Protein-calorie malnutrition,  moderate (HCC) improved  Seizure (HCC) Controlled on current medication with no adverse s/e noted  Impaired mobility and ADLs Still requirin 24/7 assistance , has completed in home PT/Ot, refer for out pt eval and management for an additional 8 weeks to see if further improvement in function is possible  Hyperlipidemia Hyperlipidemia:Low fat diet discussed and encouraged.   Lipid Panel  Lab Results  Component Value Date   CHOL 178 12/31/2022   HDL 69 12/31/2022   LDLCALC 94 12/31/2022   TRIG 81 12/31/2022   CHOLHDL 2.6 12/31/2022     Controlled, no change in medication   Chronic kidney disease (CKD), stage III (moderate) Updated lab shows stability as compared to 3 months prior  Seizure disorder (HCC) Controlled on current med with no adverse s/e  Hypertension Controlled, no change in medication   Chronic systolic CHF (congestive heart failure) (HCC) Stable , no s/s of decompensation  Dysphagia Improved , now able to swallow regular diet  Depression, major, single episode, in partial remission (HCC) Controlled, no change in medication   IGT (impaired glucose tolerance) Patient educated about the importance of limiting  Carbohydrate intake , the need to commit to daily physical activity for a minimum of 30 minutes , and to commit weight loss. The fact that changes in all these areas will reduce or eliminate all together the development of diabetes is stressed.      Latest Ref Rng & Units 12/31/2022   11:31 AM 09/22/2022    7:34 AM 09/21/2022  3:19 AM 09/20/2022    2:02 AM 08/17/2022    6:47 AM  Diabetic Labs  HbA1c 4.8 - 5.6 % 6.1       Chol 100 - 199 mg/dL 542       HDL >70 mg/dL 69       Calc LDL 0 - 99 mg/dL 94       Triglycerides 0 - 149 mg/dL 81       Creatinine 6.23 - 1.00 mg/dL 7.62  8.31  5.17  6.16  0.76       12/31/2022   10:37 AM 10/24/2022   10:31 AM 10/22/2022   10:52 AM 10/22/2022   10:20 AM 09/22/2022    4:24 PM 09/22/2022    1:43 PM  09/22/2022    9:00 AM  BP/Weight  Systolic BP 137 117 107 107 116 108 130  Diastolic BP 83 51 68 68 99 54 65  Wt. (Lbs) 152.04  159 159.12     BMI 32.9 kg/m2  34.41 kg/m2 34.43 kg/m2          No data to display            Non-ischemic cardiomyopathy Annual cardiology f/u past due will refer, currently stable

## 2023-01-05 NOTE — Assessment & Plan Note (Signed)
Controlled on current medication with no adverse s/e noted

## 2023-01-05 NOTE — Assessment & Plan Note (Signed)
Still requirin 24/7 assistance , has completed in home PT/Ot, refer for out pt eval and management for an additional 8 weeks to see if further improvement in function is possible

## 2023-01-05 NOTE — Assessment & Plan Note (Addendum)
Patient educated about the importance of limiting  Carbohydrate intake , the need to commit to daily physical activity for a minimum of 30 minutes , and to commit weight loss. The fact that changes in all these areas will reduce or eliminate all together the development of diabetes is stressed.      Latest Ref Rng & Units 12/31/2022   11:31 AM 09/22/2022    7:34 AM 09/21/2022    3:19 AM 09/20/2022    2:02 AM 08/17/2022    6:47 AM  Diabetic Labs  HbA1c 4.8 - 5.6 % 6.1       Chol 100 - 199 mg/dL 185       HDL >63 mg/dL 69       Calc LDL 0 - 99 mg/dL 94       Triglycerides 0 - 149 mg/dL 81       Creatinine 1.49 - 1.00 mg/dL 7.02  6.37  8.58  8.50  0.76       12/31/2022   10:37 AM 10/24/2022   10:31 AM 10/22/2022   10:52 AM 10/22/2022   10:20 AM 09/22/2022    4:24 PM 09/22/2022    1:43 PM 09/22/2022    9:00 AM  BP/Weight  Systolic BP 137 117 107 107 116 108 130  Diastolic BP 83 51 68 68 99 54 65  Wt. (Lbs) 152.04  159 159.12     BMI 32.9 kg/m2  34.41 kg/m2 34.43 kg/m2          No data to display

## 2023-01-05 NOTE — Assessment & Plan Note (Signed)
Controlled on current med with no adverse s/e

## 2023-01-10 DIAGNOSIS — D329 Benign neoplasm of meninges, unspecified: Secondary | ICD-10-CM | POA: Diagnosis not present

## 2023-01-10 DIAGNOSIS — R1312 Dysphagia, oropharyngeal phase: Secondary | ICD-10-CM | POA: Diagnosis not present

## 2023-01-10 DIAGNOSIS — R1314 Dysphagia, pharyngoesophageal phase: Secondary | ICD-10-CM | POA: Diagnosis not present

## 2023-01-10 DIAGNOSIS — E440 Moderate protein-calorie malnutrition: Secondary | ICD-10-CM | POA: Diagnosis not present

## 2023-01-12 DIAGNOSIS — J9601 Acute respiratory failure with hypoxia: Secondary | ICD-10-CM | POA: Diagnosis not present

## 2023-01-12 DIAGNOSIS — J690 Pneumonitis due to inhalation of food and vomit: Secondary | ICD-10-CM | POA: Diagnosis not present

## 2023-01-16 DIAGNOSIS — D329 Benign neoplasm of meninges, unspecified: Secondary | ICD-10-CM | POA: Diagnosis not present

## 2023-01-16 DIAGNOSIS — E440 Moderate protein-calorie malnutrition: Secondary | ICD-10-CM | POA: Diagnosis not present

## 2023-01-16 DIAGNOSIS — R1314 Dysphagia, pharyngoesophageal phase: Secondary | ICD-10-CM | POA: Diagnosis not present

## 2023-01-16 DIAGNOSIS — R1312 Dysphagia, oropharyngeal phase: Secondary | ICD-10-CM | POA: Diagnosis not present

## 2023-01-22 ENCOUNTER — Other Ambulatory Visit: Payer: Self-pay

## 2023-01-22 ENCOUNTER — Ambulatory Visit (HOSPITAL_COMMUNITY): Payer: Medicare Other | Attending: Family Medicine | Admitting: Physical Therapy

## 2023-01-22 DIAGNOSIS — Z789 Other specified health status: Secondary | ICD-10-CM | POA: Insufficient documentation

## 2023-01-22 DIAGNOSIS — Z7409 Other reduced mobility: Secondary | ICD-10-CM | POA: Insufficient documentation

## 2023-01-22 DIAGNOSIS — R262 Difficulty in walking, not elsewhere classified: Secondary | ICD-10-CM | POA: Diagnosis not present

## 2023-01-22 DIAGNOSIS — M6281 Muscle weakness (generalized): Secondary | ICD-10-CM | POA: Insufficient documentation

## 2023-01-22 NOTE — Therapy (Signed)
OUTPATIENT PHYSICAL THERAPY LOWER EXTREMITY EVALUATION   Patient Name: Tara [name] MRN: [id number] DOB:1945-10-28, 77 y.o., female Today's Date: 01/22/2023  END OF SESSION:  PT End of Session - 01/22/23 1425     Visit Number 1    Number of Visits 12    Date for PT Re-Evaluation 03/05/23    Authorization Type [employer] medicare    Progress Note Due on Visit 10    PT Start Time 1348    PT Stop Time 1426    PT Time Calculation (min) 38 min             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   Anxiety    Breast cancer (HCC) 2008   right - s/p lumpectomy->chemo, radiation   Depression    Dysrhythmia    hx SVT   Family history of colon cancer    Family history of prostate cancer    GERD (gastroesophageal reflux disease)    H/O: hysterectomy    Hiatal hernia    History of cancer chemotherapy    History of radiation therapy    Hyperlipidemia    Hypertension 01/25/2018   Nonischemic cardiomyopathy (HCC)    Personal history of radiation therapy 01/05/209   SVT (supraventricular tachycardia)    short RP SVT documented 5/14   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HCC)    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)   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   Past Surgical History:  Procedure Laterality Date   ABDOMINAL HYSTERECTOMY  1994   fibroids,    BIOPSY  06/19/2016   Procedure: BIOPSY;  Surgeon: Corbin Ade, MD;  Location: [facility];  Service: Endoscopy;;  gastric duodenum   BREAST BIOPSY Right 12/16/2006   malignant   BREAST EXCISIONAL BIOPSY Right 2018   benign lumpectomy   BREAST LUMPECTOMY Right 02/2007   BREAST LUMPECTOMY WITH RADIOACTIVE SEED LOCALIZATION Right 07/29/2017   Procedure: RIGHT BREAST LUMPECTOMY WITH RADIOACTIVE SEED LOCALIZATION;  Surgeon: Claud Kelp, MD;  Location: [facility] OR;  Service: General;  Laterality: Right;   BREAST  SURGERY Right 2008   lumpectomy, cancer   CARDIAC CATHETERIZATION     CHOLECYSTECTOMY  1999   COLONOSCOPY  2008   Dr. Darrick Penna: multiple polyps. Path not available at time of visit.    COLONOSCOPY WITH PROPOFOL N/A 04/25/2014   Dr. Darrick Penna: Multiple tubular adenomas removed. Diverticulosis. Moderate internal hemorrhoids. Next colonoscopy planned for August 2018.   CRANIOTOMY Bilateral 03/24/2022   Procedure: Bifrontal craniotomy for resection of meningioma;  Surgeon: Coletta Memos, MD;  Location: [facility] OR;  Service: Neurosurgery;  Laterality: Bilateral;   ESOPHAGOGASTRODUODENOSCOPY (EGD) WITH PROPOFOL N/A 06/19/2016   Procedure: ESOPHAGOGASTRODUODENOSCOPY (EGD) WITH PROPOFOL;  Surgeon: Corbin Ade, MD;  Location: [facility];  Service: Endoscopy;  Laterality: N/A;   MASTECTOMY W/ SENTINEL NODE BIOPSY Right 06/09/2018   Procedure: RIGHT TOTAL MASTECTOMY WITH SENTINEL LYMPH NODE BIOPSY;  Surgeon: Claud Kelp, MD;  Location: [facility] OR;  Service: General;  Laterality: Right;   POLYPECTOMY N/A 04/25/2014   Procedure: POLYPECTOMY;  Surgeon: West Bali, MD;  Location: [facility] ORS;  Service: Endoscopy;  Laterality: N/A;  Ascending and Decending Colon x3 , Transverse colon x2, rectal   PORTACATH PLACEMENT N/A 06/09/2018   Procedure: INSERTION PORT-A-CATH;  Surgeon: Claud Kelp, MD;  Location: [facility]  OR;  Service: General;  Laterality: N/A;   RIGHT/LEFT HEART CATH AND CORONARY ANGIOGRAPHY N/A 07/10/2017   Procedure: RIGHT/LEFT HEART CATH AND CORONARY ANGIOGRAPHY;  Surgeon: Laurey Morale, MD;  Location: [facility] INVASIVE CV LAB;  Service: Cardiovascular;  Laterality: N/A;   Patient Active Problem List   Diagnosis Date Noted   Impaired mobility and ADLs 01/05/2023   IGT (impaired glucose tolerance) 01/05/2023   Non-ischemic cardiomyopathy (HCC) 01/05/2023   Constipation 10/26/2022   Status post insertion of percutaneous endoscopic gastrostomy (PEG) tube (HCC) 09/20/2022   Paroxysmal atrial fibrillation (HCC)  09/20/2022   Chronic anticoagulation 09/20/2022   Hiatal hernia 09/20/2022   Encounter for support and coordination of transition of care 08/23/2022   Aspiration pneumonia (HCC) 08/14/2022   Seizure disorder (HCC)    Lethargy 05/15/2022   Dysfunction of right cardiac ventricle 05/07/2022   Acute postoperative anemia due to expected blood loss 05/07/2022   Dysphagia 05/07/2022   Palliative care encounter    Acute respiratory failure with hypoxia (HCC)    Aspiration into airway    Pressure injury of skin 04/07/2022   Meningioma (HCC) 03/24/2022   Seizure (HCC) 03/15/2022   Atrial fibrillation, chronic (HCC) 03/15/2022   Protein-calorie malnutrition, moderate (HCC) 03/15/2022   Atrial fibrillation with RVR (HCC)    Metabolic acidosis 02/27/2022   Acute renal failure superimposed on stage 3b chronic kidney disease (HCC) 02/27/2022   Anxiety and depression 02/27/2022   Meningioma, cerebral (HCC) 02/27/2022   Normocytic anemia 02/27/2022   Acute renal failure (ARF) (HCC) 02/27/2022   Hemorrhoids 01/04/2021   Ankle deformity, right 09/12/2020   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   Port-A-Cath in place 09/28/2018   Colon adenomas 08/06/2018   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 (HCC) 06/09/2018   Hypertension 01/25/2018   AKI (acute kidney injury) (HCC) 10/25/2016   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   Acute on chronic combined systolic and diastolic CHF (congestive heart failure) (HCC)    Demand ischemia    Hypokalemia 09/29/2016   Prolonged Q-T interval on ECG 09/29/2016   Chronic kidney disease (CKD), stage III (moderate) (HCC) 09/29/2016   Leukocytosis 09/18/2016   Abnormal CT scan, chest    Upper airway cough syndrome  vs Cough variant asthma  04/09/2016   Chronic cough 03/17/2016   Seasonal allergies 08/08/2014   Family history of colon cancer 03/30/2014   Osteoporosis 02/14/2014   Metabolic syndrome X 02/05/2014   SVT (supraventricular  tachycardia) 03/14/2013   Chronic systolic CHF (congestive heart failure) (HCC) 03/14/2013   Vitamin D deficiency 07/23/2010   Malignant neoplasm of right breast (HCC) 12/13/2007   Hyperlipidemia 12/13/2007   Anxiety state 12/13/2007   Depression, major, single episode, 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   GERD 12/13/2007    PCP: Syliva Overman  REFERRING PROVIDER: Syliva Overman  REFERRING DIAG:  [phone] (ICD-10-CM) - Impaired mobility and ADLs    THERAPY DIAG:  Muscle weakness  Rationale for Evaluation and Treatment: Rehabilitation  ONSET DATE: 7/23  SUBJECTIVE STATEMENT: Ms. Stamour states that she had several seizures due to a meningioma.  She had a craniotomy for removal on 04/13/22 and remained in the hospital for 2 months at which time she went to rehab for two months She then had home health who has been seeing the pt until mid April.  She developed pneumonia and has been hospitalized several times.  At discharged she was walking with a walker and assistance.  She can walk for about 150  ft on the walker now.  She walks every day and completes her exercises.   She has had home health but did not progress well and feels she may have better results with out patient therapy.  Her husband stated that she is having more difficulty with bed mobility and balance.   PERTINENT HISTORY: CHF, osteoporosis, anxiety,cerebral meningioma, seizures PAIN:  Are you having pain? No  PRECAUTIONS: Fall  WEIGHT BEARING RESTRICTIONS: No  FALLS:  Has patient fallen in last 6 months? No  LIVING ENVIRONMENT: Lives with: lives with their family Lives in: House/apartment Stairs: Yes: Internal: 13 steps; on right going up and External: 3 steps; on right going up Has following equipment at home: Dan Humphreys - 2 wheeled  OCCUPATION: retired  PLOF: I prior to the seizures.   PATIENT GOALS: to be more I   NEXT MD VISIT: 8/24   OBJECTIVE:   DIAGNOSTIC FINDINGS:  IMPRESSION: 1.  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 2. Diffuse pachymeningeal thickening and enhancement over both cerebral hemispheres, more prominent over the frontal lobes at the midline measuring up to 7 mm in thickness, favored to reflect evolving postoperative change. Recommend follow-up in 3-6 months to ensure stability.     Electronically Signed   By: Lesia Hausen M.D.   On: 10/17/2022 11:03     COGNITION: Overall cognitive status: Within functional limits for tasks assessed    LOWER EXTREMITY MMT:  MMT Right eval Left eval  Hip flexion 5 4  Hip extension 3- 3-  Hip abduction 3 3  Hip adduction    Hip internal rotation    Hip external rotation    Knee flexion 3 3  Knee extension 5 5  Ankle dorsiflexion 4 4  Ankle plantarflexion    Ankle inversion    Ankle eversion     (Blank rows = not tested)  FUNCTIONAL TESTS:  30 seconds chair stand test:  unable to come sit to stand without UE assist at this time.   2 minute walk test: 168 ft with rolling walker Single leg stance:  unable    TODAY'S TREATMENT:                                                                                                                              DATE: 01/22/23  Rolling Rt x 5; Lt x 5 Bridge x 5 SLR x 5  Side lying hip abduction x 5 B Sit to stand   PATIENT EDUCATION:  Education details: HEP Person educated: Patient and Spouse Education method: Explanation, Actor cues, and Handouts Education comprehension: returned demonstration  HOME EXERCISE PROGRAM: Access Code: [email or web address] URL: [email or web address] Date: 01/22/2023 Prepared by: Virgina Organ  Exercises - Supine Bridge  - 1 x daily - 7 x weekly - 1 sets - 10 reps - 3" hold - Active Straight Leg  Raise with Quad Set  - 1 x daily - 7 x weekly - 1 sets - 10 reps - 3"  hold - Sidelying Hip Abduction  - 1 x daily - 7 x weekly - 1 sets - 10 reps - 3" hold - Seated Ankle Dorsiflexion AROM  - 1 x daily - 7 x weekly - 1 sets - 10 reps - 3" hold - Sit to Stand with Counter Support  - 1 x daily - 7 x weekly - 1 sets - 10 reps  ASSESSMENT:  CLINICAL IMPRESSION: Patient is a 77 y.o. female  who was seen today for physical therapy evaluation and treatment for impaired mobility.  The pt has had a craniotomy followed by admission into the hospital for pneumonia and then UTI.  PT has had extensive therapy but the family feels that the pt can continue to improve functionally as every time she started really progressing with therapy something would happen to cause her to go two steps back.  Eval demonstrates decreased balance, decreased strength, decreased activity tolerance and endurance.  Ms Drozdowski will benefit from skilled PT to address these issues to see if she can progress to ambulating prolong periods with least assistive device.   OBJECTIVE IMPAIRMENTS: cardiopulmonary status limiting activity, decreased activity tolerance, decreased balance, and decreased strength.   ACTIVITY LIMITATIONS: carrying, lifting, standing, squatting, stairs, and locomotion level  PARTICIPATION LIMITATIONS: meal prep, cleaning, laundry, shopping, community activity, and church  PERSONAL FACTORS: Fitness, Past/current experiences, Time since onset of injury/illness/exacerbation, and 3+ comorbidities: CHF, seizure, mengingoma  are also affecting patient's functional outcome.   REHAB POTENTIAL: Fair    CLINICAL DECISION MAKING: Stable/uncomplicated  EVALUATION COMPLEXITY: Low   GOALS: Goals reviewed with patient? No  SHORT TERM GOALS: Target date: 02/12/23 Pt core and LE strength to increase to allow pt to be able to come sit to stand without UE  Baseline: Goal status: INITIAL  2.  PT to be able to walk 226 ft in 2 minutes with least assistive device.  Baseline:  Goal status:  INITIAL  3.  Pt to be able to single leg stance for 5 seconds B to reduce risk of falling  Baseline:  Goal status: INITIAL    LONG TERM GOALS: Target date: 03/05/23  Pt core and LE strength to increase to allow pt to be able to go up and down 4 steps in a reciprocal manner with one handrail assist.  Baseline:  Goal status: INITIAL  2.  PT to be able to walk 300 ft in 2 minutes with least assistive device Baseline:  Goal status: INITIAL  3.  Pt to be able to single leg stance for 10 seconds B to reduce risk of falling  Baseline:  Goal status: INITIAL     PLAN:  PT FREQUENCY: 2x/week  PT DURATION: 6 weeks  PLANNED INTERVENTIONS: Therapeutic exercises, Therapeutic activity, Neuromuscular re-education, Balance training, Gait training, Patient/Family education, and Self Care  PLAN FOR NEXT SESSION: work on balance and strengthening.  Virgina Organ, PT CLT [phone]  01/22/2023, 2:27 PM

## 2023-01-28 ENCOUNTER — Ambulatory Visit (HOSPITAL_COMMUNITY): Payer: Medicare Other | Admitting: Physical Therapy

## 2023-01-28 DIAGNOSIS — R262 Difficulty in walking, not elsewhere classified: Secondary | ICD-10-CM

## 2023-01-28 DIAGNOSIS — Z7409 Other reduced mobility: Secondary | ICD-10-CM | POA: Diagnosis not present

## 2023-01-28 DIAGNOSIS — M6281 Muscle weakness (generalized): Secondary | ICD-10-CM

## 2023-01-28 DIAGNOSIS — Z789 Other specified health status: Secondary | ICD-10-CM | POA: Diagnosis not present

## 2023-01-28 NOTE — Therapy (Signed)
OUTPATIENT PHYSICAL THERAPY TREATMENT  Patient Name: Tara [name] MRN: [id number] DOB:August 24, 1946, 77 y.o., female Today's Date: 01/28/2023  END OF SESSION:  PT End of Session - 01/28/23 1418     Visit Number 2    Number of Visits 12    Date for PT Re-Evaluation 03/05/23    Authorization Type [employer] medicare    Progress Note Due on Visit 10    PT Start Time 1350    PT Stop Time 1430    PT Time Calculation (min) 40 min              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   Anxiety    Breast cancer (HCC) 2008   right - s/p lumpectomy->chemo, radiation   Depression    Dysrhythmia    hx SVT   Family history of colon cancer    Family history of prostate cancer    GERD (gastroesophageal reflux disease)    H/O: hysterectomy    Hiatal hernia    History of cancer chemotherapy    History of radiation therapy    Hyperlipidemia    Hypertension 01/25/2018   Nonischemic cardiomyopathy (HCC)    Personal history of radiation therapy 01/05/209   SVT (supraventricular tachycardia)    short RP SVT documented 5/14   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HCC)    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)   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   Past Surgical History:  Procedure Laterality Date   ABDOMINAL HYSTERECTOMY  1994   fibroids,    BIOPSY  06/19/2016   Procedure: BIOPSY;  Surgeon: Corbin Ade, MD;  Location: [facility];  Service: Endoscopy;;  gastric duodenum   BREAST BIOPSY Right 12/16/2006   malignant   BREAST EXCISIONAL BIOPSY Right 2018   benign lumpectomy   BREAST LUMPECTOMY Right 02/2007   BREAST LUMPECTOMY WITH RADIOACTIVE SEED LOCALIZATION Right 07/29/2017   Procedure: RIGHT BREAST LUMPECTOMY WITH RADIOACTIVE SEED LOCALIZATION;  Surgeon: Claud Kelp, MD;  Location: [facility] OR;  Service: General;  Laterality: Right;   BREAST SURGERY Right  2008   lumpectomy, cancer   CARDIAC CATHETERIZATION     CHOLECYSTECTOMY  1999   COLONOSCOPY  2008   Dr. Darrick Penna: multiple polyps. Path not available at time of visit.    COLONOSCOPY WITH PROPOFOL N/A 04/25/2014   Dr. Darrick Penna: Multiple tubular adenomas removed. Diverticulosis. Moderate internal hemorrhoids. Next colonoscopy planned for August 2018.   CRANIOTOMY Bilateral 03/24/2022   Procedure: Bifrontal craniotomy for resection of meningioma;  Surgeon: Coletta Memos, MD;  Location: [facility] OR;  Service: Neurosurgery;  Laterality: Bilateral;   ESOPHAGOGASTRODUODENOSCOPY (EGD) WITH PROPOFOL N/A 06/19/2016   Procedure: ESOPHAGOGASTRODUODENOSCOPY (EGD) WITH PROPOFOL;  Surgeon: Corbin Ade, MD;  Location: [facility];  Service: Endoscopy;  Laterality: N/A;   MASTECTOMY W/ SENTINEL NODE BIOPSY Right 06/09/2018   Procedure: RIGHT TOTAL MASTECTOMY WITH SENTINEL LYMPH NODE BIOPSY;  Surgeon: Claud Kelp, MD;  Location: [facility] OR;  Service: General;  Laterality: Right;   POLYPECTOMY N/A 04/25/2014   Procedure: POLYPECTOMY;  Surgeon: West Bali, MD;  Location: [facility] ORS;  Service: Endoscopy;  Laterality: N/A;  Ascending and Decending Colon x3 , Transverse colon x2, rectal   PORTACATH PLACEMENT N/A 06/09/2018   Procedure: INSERTION PORT-A-CATH;  Surgeon: Claud Kelp, MD;  Location: [facility] OR;  Service: General;  Laterality: N/A;   RIGHT/LEFT HEART CATH AND CORONARY ANGIOGRAPHY N/A 07/10/2017   Procedure: RIGHT/LEFT HEART CATH AND CORONARY ANGIOGRAPHY;  Surgeon: Laurey Morale, MD;  Location: [facility] INVASIVE CV LAB;  Service: Cardiovascular;  Laterality: N/A;   Patient Active Problem List   Diagnosis Date Noted   Impaired mobility and ADLs 01/05/2023   IGT (impaired glucose tolerance) 01/05/2023   Non-ischemic cardiomyopathy (HCC) 01/05/2023   Constipation 10/26/2022   Status post insertion of percutaneous endoscopic gastrostomy (PEG) tube (HCC) 09/20/2022   Paroxysmal atrial fibrillation (HCC) 09/20/2022    Chronic anticoagulation 09/20/2022   Hiatal hernia 09/20/2022   Encounter for support and coordination of transition of care 08/23/2022   Aspiration pneumonia (HCC) 08/14/2022   Seizure disorder (HCC)    Lethargy 05/15/2022   Dysfunction of right cardiac ventricle 05/07/2022   Acute postoperative anemia due to expected blood loss 05/07/2022   Dysphagia 05/07/2022   Palliative care encounter    Acute respiratory failure with hypoxia (HCC)    Aspiration into airway    Pressure injury of skin 04/07/2022   Meningioma (HCC) 03/24/2022   Seizure (HCC) 03/15/2022   Atrial fibrillation, chronic (HCC) 03/15/2022   Protein-calorie malnutrition, moderate (HCC) 03/15/2022   Atrial fibrillation with RVR (HCC)    Metabolic acidosis 02/27/2022   Acute renal failure superimposed on stage 3b chronic kidney disease (HCC) 02/27/2022   Anxiety and depression 02/27/2022   Meningioma, cerebral (HCC) 02/27/2022   Normocytic anemia 02/27/2022   Acute renal failure (ARF) (HCC) 02/27/2022   Hemorrhoids 01/04/2021   Ankle deformity, right 09/12/2020   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   Port-A-Cath in place 09/28/2018   Colon adenomas 08/06/2018   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 (HCC) 06/09/2018   Hypertension 01/25/2018   AKI (acute kidney injury) (HCC) 10/25/2016   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   Acute on chronic combined systolic and diastolic CHF (congestive heart failure) (HCC)    Demand ischemia    Hypokalemia 09/29/2016   Prolonged Q-T interval on ECG 09/29/2016   Chronic kidney disease (CKD), stage III (moderate) (HCC) 09/29/2016   Leukocytosis 09/18/2016   Abnormal CT scan, chest    Upper airway cough syndrome  vs Cough variant asthma  04/09/2016   Chronic cough 03/17/2016   Seasonal allergies 08/08/2014   Family history of colon cancer 03/30/2014   Osteoporosis 02/14/2014   Metabolic syndrome X 02/05/2014   SVT (supraventricular tachycardia)  03/14/2013   Chronic systolic CHF (congestive heart failure) (HCC) 03/14/2013   Vitamin D deficiency 07/23/2010   Malignant neoplasm of right breast (HCC) 12/13/2007   Hyperlipidemia 12/13/2007   Anxiety state 12/13/2007   Depression, major, single episode, 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   GERD 12/13/2007    PCP: Syliva Overman  REFERRING PROVIDER: Syliva Overman  REFERRING DIAG:  [phone] (ICD-10-CM) - Impaired mobility and ADLs    THERAPY DIAG:  Muscle weakness  Rationale for Evaluation and Treatment: Rehabilitation  ONSET DATE: 7/23  SUBJECTIVE STATEMENT: Pt states she has been compliant with HEP.  Comes today with aide, using RW.    Evaluation:  Ms. Pendell states that she had several seizures due to a meningioma.  She had a craniotomy for removal on 04/13/22 and remained in the hospital for 2 months at which time she went to rehab for two months She then had home health who has been seeing the pt until mid April.  She developed pneumonia and has been hospitalized several times.  At discharged she was walking with a walker and assistance.  She can walk for about 150 ft on the walker now.  She walks every day and completes her exercises.   She has had home health but did not progress well and feels she may have better results with out patient therapy.  Her husband stated that she is having more difficulty with bed mobility and balance.   PERTINENT HISTORY: CHF, osteoporosis, anxiety,cerebral meningioma, seizures PAIN:  Are you having pain? No  PRECAUTIONS: Fall  WEIGHT BEARING RESTRICTIONS: No  FALLS:  Has patient fallen in last 6 months? No  LIVING ENVIRONMENT: Lives with: lives with their family Lives in: House/apartment Stairs: Yes: Internal: 13 steps; on right going up and External: 3 steps; on right going up Has following equipment at home: Dan Humphreys - 2 wheeled  OCCUPATION: retired  PLOF: I prior to the seizures.   PATIENT GOALS: to be more  Independent; be able to go to basement and do laundry  NEXT MD VISIT: 8/24   OBJECTIVE:   DIAGNOSTIC FINDINGS:  IMPRESSION: 1. 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 2. Diffuse pachymeningeal thickening and enhancement over both cerebral hemispheres, more prominent over the frontal lobes at the midline measuring up to 7 mm in thickness, favored to reflect evolving postoperative change. Recommend follow-up in 3-6 months to ensure stability.     Electronically Signed   By: Lesia Hausen M.D.   On: 10/17/2022 11:03     COGNITION: Overall cognitive status: Within functional limits for tasks assessed    LOWER EXTREMITY MMT:  MMT Right eval Left eval  Hip flexion 5 4  Hip extension 3- 3-  Hip abduction 3 3  Hip adduction    Hip internal rotation    Hip external rotation    Knee flexion 3 3  Knee extension 5 5  Ankle dorsiflexion 4 4  Ankle plantarflexion    Ankle inversion    Ankle eversion     (Blank rows = not tested)  FUNCTIONAL TESTS:  30 seconds chair stand test:  unable to come sit to stand without UE assist at this time.   2 minute walk test: 168 ft with rolling walker Single leg stance:  unable    TODAY'S TREATMENT:                                                                                                                              DATE: 01/28/23  Ambulation with RW 200 feet, 100 feet Transferring sit to/from supine  Supine:  Bridge 2X10  SLR 2X10 each Sidelying hip abduction each 2X10 Prone:  hip extension 2X10  Knee flexion bil 2X10 Sit to stand no UE standard chair 10X  LAQ 10X5"  01/22/23  Rolling Rt x 5; Lt x 5 Bridge x 5 SLR x 5  Side lying  hip abduction x 5 B Sit to stand   PATIENT EDUCATION:  Education details: HEP Person educated: Patient and Spouse Education method: Explanation,  Actor cues, and Handouts Education comprehension: returned demonstration  HOME EXERCISE PROGRAM: Access Code: [email or web address] URL: [email or web address] Date: 01/22/2023 Prepared by: Virgina Organ  Exercises - Supine Bridge  - 1 x daily - 7 x weekly - 1 sets - 10 reps - 3" hold - Active Straight Leg Raise with Quad Set  - 1 x daily - 7 x weekly - 1 sets - 10 reps - 3" hold - Sidelying Hip Abduction  - 1 x daily - 7 x weekly - 1 sets - 10 reps - 3" hold - Seated Ankle Dorsiflexion AROM  - 1 x daily - 7 x weekly - 1 sets - 10 reps - 3" hold - Sit to Stand with Counter Support  - 1 x daily - 7 x weekly - 1 sets - 10 reps  ASSESSMENT:  CLINICAL IMPRESSION: Goals reviewed and POC moving forward.  Pt able to recall general exercises but required cues for form and correct mm isolation.  Completed 2 sets of each exercise today and added prone hip and hamstring work as well.  Pt with postural cues to maintain within BOS using walker but overall good stability and cadence.  Pt very motivated and good at keeping count of exercises.  Pt reports she would like to be able to go to basement to do laundry (added this to personal goal information). Pt will continue to benefit from skilled therapy to progress toward goals and improve function.  Evaluation: Patient is a 77 y.o. female  who was seen today for physical therapy evaluation and treatment for impaired mobility.  The pt has had a craniotomy followed by admission into the hospital for pneumonia and then UTI.  PT has had extensive therapy but the family feels that the pt can continue to improve functionally as every time she started really progressing with therapy something would happen to cause her to go two steps back.  Eval demonstrates decreased balance, decreased strength, decreased activity tolerance and endurance.  Ms Schuerger will benefit from skilled PT to address these issues to see if she can progress to ambulating prolong periods with  least assistive device.   OBJECTIVE IMPAIRMENTS: cardiopulmonary status limiting activity, decreased activity tolerance, decreased balance, and decreased strength.   ACTIVITY LIMITATIONS: carrying, lifting, standing, squatting, stairs, and locomotion level  PARTICIPATION LIMITATIONS: meal prep, cleaning, laundry, shopping, community activity, and church  PERSONAL FACTORS: Fitness, Past/current experiences, Time since onset of injury/illness/exacerbation, and 3+ comorbidities: CHF, seizure, mengingoma  are also affecting patient's functional outcome.   REHAB POTENTIAL: Fair    CLINICAL DECISION MAKING: Stable/uncomplicated  EVALUATION COMPLEXITY: Low   GOALS: Goals reviewed with patient? Yes  SHORT TERM GOALS: Target date: 02/12/23 Pt core and LE strength to increase to allow pt to be able to come sit to stand without UE  Baseline: Goal status: IN PROGRESS  2.  PT to be able to walk 226 ft in 2 minutes with least assistive device.  Baseline:  Goal status: IN PROGRESS  3.  Pt to be able to single leg stance for 5 seconds B to reduce risk of falling  Baseline:  Goal status: MET    LONG TERM GOALS: Target date: 03/05/23  Pt core and LE strength to increase to allow pt to be able to go up and down 4 steps in a reciprocal manner with one handrail assist.  Baseline:  Goal status: IN PROGRESS  2.  PT to be able to walk 300 ft in 2 minutes with least assistive device Baseline:  Goal status: IN PROGRESS  3.  Pt to be able to single leg stance for 10 seconds B to reduce risk of falling  Baseline:  Goal status: IN PROGRESS     PLAN:  PT FREQUENCY: 2x/week  PT DURATION: 6 weeks  PLANNED INTERVENTIONS: Therapeutic exercises, Therapeutic activity, Neuromuscular re-education, Balance training, Gait training, Patient/Family education, and Self Care  PLAN FOR NEXT SESSION: work on balance and strengthening.  Lurena Nida, PTA/CLT [facility] Ph: [phone] 01/28/2023, 2:19 PM

## 2023-01-29 DIAGNOSIS — H25813 Combined forms of age-related cataract, bilateral: Secondary | ICD-10-CM | POA: Diagnosis not present

## 2023-02-02 ENCOUNTER — Other Ambulatory Visit: Payer: Self-pay | Admitting: Family Medicine

## 2023-02-02 DIAGNOSIS — E872 Acidosis, unspecified: Secondary | ICD-10-CM | POA: Diagnosis not present

## 2023-02-02 DIAGNOSIS — F419 Anxiety disorder, unspecified: Secondary | ICD-10-CM | POA: Diagnosis not present

## 2023-02-02 DIAGNOSIS — I4891 Unspecified atrial fibrillation: Secondary | ICD-10-CM | POA: Diagnosis not present

## 2023-02-02 DIAGNOSIS — N179 Acute kidney failure, unspecified: Secondary | ICD-10-CM | POA: Diagnosis not present

## 2023-02-04 ENCOUNTER — Ambulatory Visit (HOSPITAL_COMMUNITY): Payer: Medicare Other | Admitting: Physical Therapy

## 2023-02-04 DIAGNOSIS — H25811 Combined forms of age-related cataract, right eye: Secondary | ICD-10-CM | POA: Diagnosis not present

## 2023-02-05 ENCOUNTER — Ambulatory Visit (HOSPITAL_COMMUNITY): Payer: Medicare Other | Admitting: Physical Therapy

## 2023-02-05 DIAGNOSIS — R262 Difficulty in walking, not elsewhere classified: Secondary | ICD-10-CM | POA: Diagnosis not present

## 2023-02-05 DIAGNOSIS — Z7409 Other reduced mobility: Secondary | ICD-10-CM | POA: Diagnosis not present

## 2023-02-05 DIAGNOSIS — M6281 Muscle weakness (generalized): Secondary | ICD-10-CM | POA: Diagnosis not present

## 2023-02-05 DIAGNOSIS — Z789 Other specified health status: Secondary | ICD-10-CM | POA: Diagnosis not present

## 2023-02-05 NOTE — Therapy (Signed)
OUTPATIENT PHYSICAL THERAPY TREATMENT  Patient Name: Tara [name] MRN: [id number] DOB:1946/06/17, 77 y.o., female Today's Date: 02/05/2023  END OF SESSION:  PT End of Session - 02/05/23 1442     Visit Number 3    Number of Visits 12    Date for PT Re-Evaluation 03/05/23    Authorization Type [employer] medicare    Progress Note Due on Visit 10    PT Start Time 1432    PT Stop Time 1512    PT Time Calculation (min) 40 min              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   Anxiety    Breast cancer (HCC) 2008   right - s/p lumpectomy->chemo, radiation   Depression    Dysrhythmia    hx SVT   Family history of colon cancer    Family history of prostate cancer    GERD (gastroesophageal reflux disease)    H/O: hysterectomy    Hiatal hernia    History of cancer chemotherapy    History of radiation therapy    Hyperlipidemia    Hypertension 01/25/2018   Nonischemic cardiomyopathy (HCC)    Personal history of radiation therapy 01/05/209   SVT (supraventricular tachycardia)    short RP SVT documented 5/14   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HCC)    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)   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 OR;  Service: General;  Laterality: Right;   BREAST SURGERY Right  2008   lumpectomy, cancer   CARDIAC CATHETERIZATION     CHOLECYSTECTOMY  1999   COLONOSCOPY  2008   Dr. Darrick Penna: multiple polyps. Path not available at time of visit.    COLONOSCOPY WITH PROPOFOL N/A 04/25/2014   Dr. Darrick Penna: Multiple tubular adenomas removed. Diverticulosis. Moderate internal hemorrhoids. Next colonoscopy planned for August 2018.   CRANIOTOMY Bilateral 03/24/2022   Procedure: Bifrontal craniotomy for resection of meningioma;  Surgeon: Coletta Memos, MD;  Location: [facility] OR;  Service: Neurosurgery;  Laterality: Bilateral;   ESOPHAGOGASTRODUODENOSCOPY (EGD) WITH PROPOFOL N/A 06/19/2016   Procedure: ESOPHAGOGASTRODUODENOSCOPY (EGD) WITH PROPOFOL;  Surgeon: Corbin Ade, MD;  Location: [facility];  Service: Endoscopy;  Laterality: N/A;   MASTECTOMY W/ SENTINEL NODE BIOPSY Right 06/09/2018   Procedure: RIGHT TOTAL MASTECTOMY WITH SENTINEL LYMPH NODE BIOPSY;  Surgeon: Claud Kelp, MD;  Location[facility] OR;  Service: General;  Laterality: Right;   POLYPECTOMY N/A 04/25/2014   Procedure: POLYPECTOMY;  Surgeon: West Bali, MD;  Location: [facility];  Service: Endoscopy;  Laterality: N/A;  Ascending and Decending Colon x3 , Transverse colon x2, rectal   PORTACATH PLACEMENT N/A 06/09/2018   Procedure: INSERTION PORT-A-CATH;  Surgeon: Claud Kelp, MD;  Location: [facility] OR;  Service: General;  Laterality: N/A;   RIGHT/LEFT HEART CATH AND CORONARY ANGIOGRAPHY N/A 07/10/2017   Procedure: RIGHT/LEFT HEART CATH AND CORONARY ANGIOGRAPHY;  Surgeon: Laurey Morale, MD;  Location: [facility] INVASIVE CV LAB;  Service: Cardiovascular;  Laterality: N/A;   Patient Active Problem List   Diagnosis Date Noted   Impaired mobility and ADLs 01/05/2023   IGT (impaired glucose tolerance) 01/05/2023   Non-ischemic cardiomyopathy (HCC) 01/05/2023   Constipation 10/26/2022   Status post insertion of percutaneous endoscopic gastrostomy (PEG) tube (HCC) 09/20/2022   Paroxysmal atrial fibrillation (HCC) 09/20/2022    Chronic anticoagulation 09/20/2022   Hiatal hernia 09/20/2022   Encounter for support and coordination of transition of care 08/23/2022   Aspiration pneumonia (HCC) 08/14/2022   Seizure disorder (HCC)    Lethargy 05/15/2022   Dysfunction of right cardiac ventricle 05/07/2022   Acute postoperative anemia due to expected blood loss 05/07/2022   Dysphagia 05/07/2022   Palliative care encounter    Acute respiratory failure with hypoxia (HCC)    Aspiration into airway    Pressure injury of skin 04/07/2022   Meningioma (HCC) 03/24/2022   Seizure (HCC) 03/15/2022   Atrial fibrillation, chronic (HCC) 03/15/2022   Protein-calorie malnutrition, moderate (HCC) 03/15/2022   Atrial fibrillation with RVR (HCC)    Metabolic acidosis 02/27/2022   Acute renal failure superimposed on stage 3b chronic kidney disease (HCC) 02/27/2022   Anxiety and depression 02/27/2022   Meningioma, cerebral (HCC) 02/27/2022   Normocytic anemia 02/27/2022   Acute renal failure (ARF) (HCC) 02/27/2022   Hemorrhoids 01/04/2021   Ankle deformity, right 09/12/2020   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   Port-A-Cath in place 09/28/2018   Colon adenomas 08/06/2018   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 (HCC) 06/09/2018   Hypertension 01/25/2018   AKI (acute kidney injury) (HCC) 10/25/2016   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   Acute on chronic combined systolic and diastolic CHF (congestive heart failure) (HCC)    Demand ischemia    Hypokalemia 09/29/2016   Prolonged Q-T interval on ECG 09/29/2016   Chronic kidney disease (CKD), stage III (moderate) (HCC) 09/29/2016   Leukocytosis 09/18/2016   Abnormal CT scan, chest    Upper airway cough syndrome  vs Cough variant asthma  04/09/2016   Chronic cough 03/17/2016   Seasonal allergies 08/08/2014   Family history of colon cancer 03/30/2014   Osteoporosis 02/14/2014   Metabolic syndrome X 02/05/2014   SVT (supraventricular tachycardia)  03/14/2013   Chronic systolic CHF (congestive heart failure) (HCC) 03/14/2013   Vitamin D deficiency 07/23/2010   Malignant neoplasm of right breast (HCC) 12/13/2007   Hyperlipidemia 12/13/2007   Anxiety state 12/13/2007   Depression, major, single episode, 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   GERD 12/13/2007    PCP: Syliva Overman  REFERRING PROVIDER: Syliva Overman  REFERRING DIAG:  [id number] (ICD-10-CM) - Impaired mobility and ADLs    THERAPY DIAG:  Muscle weakness  Rationale for Evaluation and Treatment: Rehabilitation  ONSET DATE: 7/23  SUBJECTIVE STATEMENT: Pt states she is doing well today without complaints.   Evaluation:  Tara [name] states that she had several seizures due to a meningioma.  She had a craniotomy for removal on 04/13/22 and remained in the hospital for 2 months at which time she went to rehab for two months She then had home health who has been seeing the pt until mid April.  She developed pneumonia and has been hospitalized several times.  At discharged she was walking with  a walker and assistance.  She can walk for about 150 ft on the walker now.  She walks every day and completes her exercises.   She has had home health but did not progress well and feels she may have better results with out patient therapy.  Her husband stated that she is having more difficulty with bed mobility and balance.   PERTINENT HISTORY: CHF, osteoporosis, anxiety,cerebral meningioma, seizures, PEG placement in abdomen PAIN:  Are you having pain? No  PRECAUTIONS: Fall  WEIGHT BEARING RESTRICTIONS: No  FALLS:  Has patient fallen in last 6 months? No  LIVING ENVIRONMENT: Lives with: lives with their family Lives in: House/apartment Stairs: Yes: Internal: 13 steps; on right going up and External: 3 steps; on right going up Has following equipment at home: Dan Humphreys - 2 wheeled  OCCUPATION: retired  PLOF: I prior to the seizures.   PATIENT GOALS: to be more  Independent; be able to go to basement and do laundry  NEXT MD VISIT: 8/24   OBJECTIVE:   DIAGNOSTIC FINDINGS:  IMPRESSION: 1. 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 2. Diffuse pachymeningeal thickening and enhancement over both cerebral hemispheres, more prominent over the frontal lobes at the midline measuring up to 7 mm in thickness, favored to reflect evolving postoperative change. Recommend follow-up in 3-6 months to ensure stability.     Electronically Signed   By: Lesia Hausen M.D.   On: 10/17/2022 11:03     COGNITION: Overall cognitive status: Within functional limits for tasks assessed    LOWER EXTREMITY MMT:  MMT Right eval Left eval  Hip flexion 5 4  Hip extension 3- 3-  Hip abduction 3 3  Hip adduction    Hip internal rotation    Hip external rotation    Knee flexion 3 3  Knee extension 5 5  Ankle dorsiflexion 4 4  Ankle plantarflexion    Ankle inversion    Ankle eversion     (Blank rows = not tested)  FUNCTIONAL TESTS:  30 seconds chair stand test:  unable to come sit to stand without UE assist at this time.   2 minute walk test: 168 ft with rolling walker Single leg stance:  unable    TODAY'S TREATMENT:                                                                                                                              DATE: 02/05/23  Ambulation with RW 226 feet no rest break Transferring sit to/from supine  Supine:  Bridge 2X10  SLR 2X10 each Sidelying hip abduction each 2X10 Prone:  hip extension 2X10  Knee flexion bil 2X10 Sit to stand no UE standard chair 10X  LAQ 10X5"  01/28/23  Ambulation with RW 200 feet, 100 feet Transferring sit to/from supine  Supine:  Bridge 2X10  SLR 2X10 each Sidelying hip abduction each 2X10 Prone:  hip extension 2X10  Knee flexion bil 2X10 Sit to  stand no UE standard chair 10X  LAQ 10X5"  01/22/23  Rolling Rt x 5; Lt x 5 Bridge x 5 SLR x 5  Side lying hip abduction x 5 B Sit to stand   PATIENT EDUCATION:  Education details: HEP Person educated: Patient and Spouse Education method: Explanation, Actor cues, and Handouts Education comprehension: returned demonstration  HOME EXERCISE PROGRAM: Access Code: [email or web address] URL: [email or web address] Date: 01/22/2023 Prepared by: Virgina Organ  Exercises - Supine Bridge  - 1 x daily - 7 x weekly - 1 sets - 10 reps - 3" hold - Active Straight Leg Raise with Quad Set  - 1 x daily - 7 x weekly - 1 sets - 10 reps - 3" hold - Sidelying Hip Abduction  - 1 x daily - 7 x weekly - 1 sets - 10 reps - 3" hold - Seated Ankle Dorsiflexion AROM  - 1 x daily - 7 x weekly - 1 sets - 10 reps - 3" hold - Sit to Stand with Counter Support  - 1 x daily - 7 x weekly - 1 sets - 10 reps  ASSESSMENT:  CLINICAL IMPRESSION: Began session with ambulation using RW around the clinic.  Pt able to complete a full lap without rest break.  Last visit she had to rest before halfway point.  Pt requires cues for exercise recall and correct form today, however states she follows her sheets at home.  Pt with postural cues to maintain within BOS using walker but overall good gait quality and stability using walker. Pt will continue to benefit from skilled therapy to progress toward goals and improve function.  Evaluation: Patient is a 77 y.o. female  who was seen today for physical therapy evaluation and treatment for impaired mobility.  The pt has had a craniotomy followed by admission into the hospital for pneumonia and then UTI.  PT has had extensive therapy but the family feels that the pt can continue to improve functionally as every time she started really progressing with therapy something would happen to cause her to go two steps back.  Eval demonstrates decreased balance, decreased strength, decreased  activity tolerance and endurance.  Tara [name] will benefit from skilled PT to address these issues to see if she can progress to ambulating prolong periods with least assistive device.   OBJECTIVE IMPAIRMENTS: cardiopulmonary status limiting activity, decreased activity tolerance, decreased balance, and decreased strength.   ACTIVITY LIMITATIONS: carrying, lifting, standing, squatting, stairs, and locomotion level  PARTICIPATION LIMITATIONS: meal prep, cleaning, laundry, shopping, community activity, and church  PERSONAL FACTORS: Fitness, Past/current experiences, Time since onset of injury/illness/exacerbation, and 3+ comorbidities: CHF, seizure, mengingoma  are also affecting patient's functional outcome.   REHAB POTENTIAL: Fair    CLINICAL DECISION MAKING: Stable/uncomplicated  EVALUATION COMPLEXITY: Low   GOALS: Goals reviewed with patient? Yes  SHORT TERM GOALS: Target date: 02/12/23 Pt core and LE strength to increase to allow pt to be able to come sit to stand without UE  Baseline: Goal status: IN PROGRESS  2.  PT to be able to walk 226 ft in 2 minutes with least assistive device.  Baseline:  Goal status: IN PROGRESS  3.  Pt to be able to single leg stance for 5 seconds B to reduce risk of falling  Baseline:  Goal status: MET    LONG TERM GOALS: Target date: 03/05/23  Pt core and LE strength to increase to allow pt to be able to go up and down 4 steps in a reciprocal manner with one handrail assist.  Baseline:  Goal status: IN PROGRESS  2.  PT to be able to walk 300 ft in 2 minutes with least assistive device Baseline:  Goal status: IN PROGRESS  3.  Pt to be able to single leg stance for 10 seconds B to reduce risk of falling  Baseline:  Goal status: IN PROGRESS     PLAN:  PT FREQUENCY: 2x/week  PT DURATION: 6 weeks  PLANNED INTERVENTIONS: Therapeutic exercises, Therapeutic activity, Neuromuscular re-education, Balance training, Gait training,  Patient/Family education, and Self Care  PLAN FOR NEXT SESSION: work on balance and strengthening to progress toward goals.  Progress to standing exercises next session.   Lurena Nida, PTA/CLT [facility] Ph: [phone] 02/05/2023, 2:42 PM

## 2023-02-09 DIAGNOSIS — R1314 Dysphagia, pharyngoesophageal phase: Secondary | ICD-10-CM | POA: Diagnosis not present

## 2023-02-09 DIAGNOSIS — E440 Moderate protein-calorie malnutrition: Secondary | ICD-10-CM | POA: Diagnosis not present

## 2023-02-09 DIAGNOSIS — N179 Acute kidney failure, unspecified: Secondary | ICD-10-CM | POA: Diagnosis not present

## 2023-02-09 DIAGNOSIS — D329 Benign neoplasm of meninges, unspecified: Secondary | ICD-10-CM | POA: Diagnosis not present

## 2023-02-09 DIAGNOSIS — F419 Anxiety disorder, unspecified: Secondary | ICD-10-CM | POA: Diagnosis not present

## 2023-02-09 DIAGNOSIS — R1312 Dysphagia, oropharyngeal phase: Secondary | ICD-10-CM | POA: Diagnosis not present

## 2023-02-09 DIAGNOSIS — E872 Acidosis, unspecified: Secondary | ICD-10-CM | POA: Diagnosis not present

## 2023-02-09 DIAGNOSIS — I4891 Unspecified atrial fibrillation: Secondary | ICD-10-CM | POA: Diagnosis not present

## 2023-02-11 ENCOUNTER — Ambulatory Visit (HOSPITAL_COMMUNITY): Payer: Medicare Other

## 2023-02-11 ENCOUNTER — Encounter (HOSPITAL_COMMUNITY): Payer: Self-pay

## 2023-02-11 DIAGNOSIS — M6281 Muscle weakness (generalized): Secondary | ICD-10-CM

## 2023-02-11 DIAGNOSIS — Z7409 Other reduced mobility: Secondary | ICD-10-CM | POA: Diagnosis not present

## 2023-02-11 DIAGNOSIS — J9601 Acute respiratory failure with hypoxia: Secondary | ICD-10-CM | POA: Diagnosis not present

## 2023-02-11 DIAGNOSIS — J690 Pneumonitis due to inhalation of food and vomit: Secondary | ICD-10-CM | POA: Diagnosis not present

## 2023-02-11 DIAGNOSIS — R262 Difficulty in walking, not elsewhere classified: Secondary | ICD-10-CM | POA: Diagnosis not present

## 2023-02-11 DIAGNOSIS — Z789 Other specified health status: Secondary | ICD-10-CM | POA: Diagnosis not present

## 2023-02-11 NOTE — Therapy (Signed)
OUTPATIENT PHYSICAL THERAPY TREATMENT  Patient Name: TRACHELLE BIANCHI MRN: [id number] DOB:May 20, 1946, 77 y.o., female Today's Date: 02/11/2023  END OF SESSION:  PT End of Session - 02/11/23 1425     Visit Number 4    Number of Visits 12    Date for PT Re-Evaluation 03/05/23    Authorization Type [employer] medicare    Progress Note Due on Visit 10    PT Start Time 1346    PT Stop Time 1428    PT Time Calculation (min) 42 min    Equipment Utilized During Treatment Gait belt    Activity Tolerance Patient tolerated treatment well    Behavior During Therapy WFL for tasks assessed/performed               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   Anxiety    Breast cancer (HCC) 2008   right - s/p lumpectomy->chemo, radiation   Depression    Dysrhythmia    hx SVT   Family history of colon cancer    Family history of prostate cancer    GERD (gastroesophageal reflux disease)    H/O: hysterectomy    Hiatal hernia    History of cancer chemotherapy    History of radiation therapy    Hyperlipidemia    Hypertension 01/25/2018   Nonischemic cardiomyopathy (HCC)    Personal history of radiation therapy 01/05/209   SVT (supraventricular tachycardia)    short RP SVT documented 5/14   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HCC)    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)   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   Past Surgical History:  Procedure Laterality Date   ABDOMINAL HYSTERECTOMY  1994   fibroids,    BIOPSY  06/19/2016   Procedure: BIOPSY;  Surgeon: Corbin Ade, MD;  Location: [facility];  Service: Endoscopy;;  gastric duodenum   BREAST BIOPSY Right 12/16/2006   malignant   BREAST EXCISIONAL BIOPSY Right 2018   benign lumpectomy   BREAST LUMPECTOMY Right 02/2007   BREAST LUMPECTOMY WITH RADIOACTIVE SEED LOCALIZATION Right 07/29/2017   Procedure: RIGHT  BREAST LUMPECTOMY WITH RADIOACTIVE SEED LOCALIZATION;  Surgeon: Claud Kelp, MD;  Location: [facility] OR;  Service: General;  Laterality: Right;   BREAST SURGERY Right 2008   lumpectomy, cancer   CARDIAC CATHETERIZATION     CHOLECYSTECTOMY  1999   COLONOSCOPY  2008   Dr. Darrick Penna: multiple polyps. Path not available at time of visit.    COLONOSCOPY WITH PROPOFOL N/A 04/25/2014   Dr. Darrick Penna: Multiple tubular adenomas removed. Diverticulosis. Moderate internal hemorrhoids. Next colonoscopy planned for August 2018.   CRANIOTOMY Bilateral 03/24/2022   Procedure: Bifrontal craniotomy for resection of meningioma;  Surgeon: Coletta Memos, MD;  Location: [facility] OR;  Service: Neurosurgery;  Laterality: Bilateral;   ESOPHAGOGASTRODUODENOSCOPY (EGD) WITH PROPOFOL N/A 06/19/2016   Procedure: ESOPHAGOGASTRODUODENOSCOPY (EGD) WITH PROPOFOL;  Surgeon: Corbin Ade, MD;  Location: [facility];  Service: Endoscopy;  Laterality: N/A;   MASTECTOMY W/ SENTINEL NODE BIOPSY Right 06/09/2018   Procedure: RIGHT TOTAL MASTECTOMY WITH SENTINEL LYMPH NODE BIOPSY;  Surgeon: Claud Kelp, MD;  Location: [facility] OR;  Service: General;  Laterality: Right;   POLYPECTOMY N/A 04/25/2014   Procedure: POLYPECTOMY;  Surgeon: West Bali, MD;  Location: [facility];  Service: Endoscopy;  Laterality: N/A;  Ascending and  Decending Colon x3 , Transverse colon x2, rectal   PORTACATH PLACEMENT N/A 06/09/2018   Procedure: INSERTION PORT-A-CATH;  Surgeon: Claud Kelp, MD;  Location: [facility] OR;  Service: General;  Laterality: N/A;   RIGHT/LEFT HEART CATH AND CORONARY ANGIOGRAPHY N/A 07/10/2017   Procedure: RIGHT/LEFT HEART CATH AND CORONARY ANGIOGRAPHY;  Surgeon: Laurey Morale, MD;  Location: [facility] INVASIVE CV LAB;  Service: Cardiovascular;  Laterality: N/A;   Patient Active Problem List   Diagnosis Date Noted   Impaired mobility and ADLs 01/05/2023   IGT (impaired glucose tolerance) 01/05/2023   Non-ischemic cardiomyopathy (HCC) 01/05/2023    Constipation 10/26/2022   Status post insertion of percutaneous endoscopic gastrostomy (PEG) tube (HCC) 09/20/2022   Paroxysmal atrial fibrillation (HCC) 09/20/2022   Chronic anticoagulation 09/20/2022   Hiatal hernia 09/20/2022   Encounter for support and coordination of transition of care 08/23/2022   Aspiration pneumonia (HCC) 08/14/2022   Seizure disorder (HCC)    Lethargy 05/15/2022   Dysfunction of right cardiac ventricle 05/07/2022   Acute postoperative anemia due to expected blood loss 05/07/2022   Dysphagia 05/07/2022   Palliative care encounter    Acute respiratory failure with hypoxia (HCC)    Aspiration into airway    Pressure injury of skin 04/07/2022   Meningioma (HCC) 03/24/2022   Seizure (HCC) 03/15/2022   Atrial fibrillation, chronic (HCC) 03/15/2022   Protein-calorie malnutrition, moderate (HCC) 03/15/2022   Atrial fibrillation with RVR (HCC)    Metabolic acidosis 02/27/2022   Acute renal failure superimposed on stage 3b chronic kidney disease (HCC) 02/27/2022   Anxiety and depression 02/27/2022   Meningioma, cerebral (HCC) 02/27/2022   Normocytic anemia 02/27/2022   Acute renal failure (ARF) (HCC) 02/27/2022   Hemorrhoids 01/04/2021   Ankle deformity, right 09/12/2020   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   Port-A-Cath in place 09/28/2018   Colon adenomas 08/06/2018   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 (HCC) 06/09/2018   Hypertension 01/25/2018   AKI (acute kidney injury) (HCC) 10/25/2016   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   Acute on chronic combined systolic and diastolic CHF (congestive heart failure) (HCC)    Demand ischemia    Hypokalemia 09/29/2016   Prolonged Q-T interval on ECG 09/29/2016   Chronic kidney disease (CKD), stage III (moderate) (HCC) 09/29/2016   Leukocytosis 09/18/2016   Abnormal CT scan, chest    Upper airway cough syndrome  vs Cough variant asthma  04/09/2016   Chronic cough 03/17/2016    Seasonal allergies 08/08/2014   Family history of colon cancer 03/30/2014   Osteoporosis 02/14/2014   Metabolic syndrome X 02/05/2014   SVT (supraventricular tachycardia) 03/14/2013   Chronic systolic CHF (congestive heart failure) (HCC) 03/14/2013   Vitamin D deficiency 07/23/2010   Malignant neoplasm of right breast (HCC) 12/13/2007   Hyperlipidemia 12/13/2007   Anxiety state 12/13/2007   Depression, major, single episode, 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   GERD 12/13/2007    PCP: Syliva Overman  REFERRING PROVIDER: Syliva Overman  REFERRING DIAG:  [phone] (ICD-10-CM) - Impaired mobility and ADLs    THERAPY DIAG:  Muscle weakness  Rationale for Evaluation and Treatment: Rehabilitation  ONSET DATE: 7/23  SUBJECTIVE STATEMENT: Pt stated she is feeling good today.  Reports ability to get in and out of bed without assistance.  Arrived walking with RW today.  No reports of recent fall or pain.    Evaluation:  Ms. Ings states that she had several seizures due to a meningioma.  She had a  craniotomy for removal on 04/13/22 and remained in the hospital for 2 months at which time she went to rehab for two months She then had home health who has been seeing the pt until mid April.  She developed pneumonia and has been hospitalized several times.  At discharged she was walking with a walker and assistance.  She can walk for about 150 ft on the walker now.  She walks every day and completes her exercises.   She has had home health but did not progress well and feels she may have better results with out patient therapy.  Her husband stated that she is having more difficulty with bed mobility and balance.   PERTINENT HISTORY: CHF, osteoporosis, anxiety,cerebral meningioma, seizures, PEG placement in abdomen PAIN:  Are you having pain? No  PRECAUTIONS: Fall  WEIGHT BEARING RESTRICTIONS: No  FALLS:  Has patient fallen in last 6 months? No  LIVING ENVIRONMENT: Lives with:  lives with their family Lives in: House/apartment Stairs: Yes: Internal: 13 steps; on right going up and External: 3 steps; on right going up Has following equipment at home: Dan Humphreys - 2 wheeled  OCCUPATION: retired  PLOF: I prior to the seizures.   PATIENT GOALS: to be more Independent; be able to go to basement and do laundry  NEXT MD VISIT: 8/24   OBJECTIVE:   DIAGNOSTIC FINDINGS:  IMPRESSION: 1. 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 2. Diffuse pachymeningeal thickening and enhancement over both cerebral hemispheres, more prominent over the frontal lobes at the midline measuring up to 7 mm in thickness, favored to reflect evolving postoperative change. Recommend follow-up in 3-6 months to ensure stability.     Electronically Signed   By: Lesia Hausen M.D.   On: 10/17/2022 11:03     COGNITION: Overall cognitive status: Within functional limits for tasks assessed    LOWER EXTREMITY MMT:  MMT Right eval Left eval  Hip flexion 5 4  Hip extension 3- 3-  Hip abduction 3 3  Hip adduction    Hip internal rotation    Hip external rotation    Knee flexion 3 3  Knee extension 5 5  Ankle dorsiflexion 4 4  Ankle plantarflexion    Ankle inversion    Ankle eversion     (Blank rows = not tested)  FUNCTIONAL TESTS:  30 seconds chair stand test:  unable to come sit to stand without UE assist at this time.   2 minute walk test: 168 ft with rolling walker Single leg stance:  unable    TODAY'S TREATMENT:                                                                                                                              DATE: 02/11/23 226RW no LOB  10 STS no HHA Standing:   Heel raise  10x 2 Toe raises decline slope 10x 2 Minisquat 10x 2 Abduction 10x 2 Tandem stance 2x 30" Sidestep front of counter  2RT Toetapping 6in with decreased HHA every set 3x 10    02/05/23  Ambulation with RW 226 feet no rest break Transferring sit to/from supine  Supine:  Bridge 2X10  SLR 2X10 each Sidelying hip abduction each 2X10 Prone:  hip extension 2X10  Knee flexion bil 2X10 Sit to stand no UE standard chair 10X  LAQ 10X5"  01/28/23  Ambulation with RW 200 feet, 100 feet Transferring sit to/from supine  Supine:  Bridge 2X10  SLR 2X10 each Sidelying hip abduction each 2X10 Prone:  hip extension 2X10  Knee flexion bil 2X10 Sit to stand no UE standard chair 10X  LAQ 10X5"  01/22/23  Rolling Rt x 5; Lt x 5 Bridge x 5 SLR x 5  Side lying hip abduction x 5 B Sit to stand   PATIENT EDUCATION:  Education details: HEP Person educated: Patient and Spouse Education method: Explanation, Actor cues, and Handouts Education comprehension: returned demonstration  HOME EXERCISE PROGRAM: Access Code: [email or web address] URL: [email or web address] Date: 01/22/2023 Prepared by: Virgina Organ  Exercises - Supine Bridge  - 1 x daily - 7 x weekly - 1 sets - 10 reps - 3" hold - Active Straight Leg Raise with Quad Set  - 1 x daily - 7 x weekly - 1 sets - 10 reps - 3" hold - Sidelying Hip Abduction  - 1 x daily - 7 x weekly - 1 sets - 10 reps - 3" hold - Seated Ankle Dorsiflexion AROM  - 1 x daily - 7 x weekly - 1 sets - 10 reps - 3" hold - Sit to Stand with Counter Support  - 1 x daily - 7 x weekly - 1 sets - 10 reps  ASSESSMENT:  CLINICAL IMPRESSION: Progressed to standing exercises for balance and functional strengthening with good tolerance.  Pt ambulated with RW through session with occasional seated rest breaks for fatigue recovery.  Pt required cueing for posture through session as tendency to lean forward.  Min A and HHA utilized for safety with new standing exercises that required NBOS or SLS gait associated activities.  No reports of pain through session.    Evaluation: Patient is a 77  y.o. female  who was seen today for physical therapy evaluation and treatment for impaired mobility.  The pt has had a craniotomy followed by admission into the hospital for pneumonia and then UTI.  PT has had extensive therapy but the family feels that the pt can continue to improve functionally as every time she started really progressing with therapy something would happen to cause her to go two steps back.  Eval demonstrates decreased balance, decreased strength, decreased activity tolerance and endurance.  Ms Woolums will benefit from skilled PT to address these issues to see if she can progress to ambulating prolong periods with least assistive device.   OBJECTIVE IMPAIRMENTS: cardiopulmonary status limiting activity, decreased activity tolerance, decreased balance, and decreased strength.   ACTIVITY LIMITATIONS: carrying, lifting, standing, squatting, stairs, and locomotion level  PARTICIPATION LIMITATIONS: meal prep, cleaning, laundry, shopping, community activity, and church  PERSONAL FACTORS: Fitness, Past/current experiences, Time since onset of injury/illness/exacerbation, and 3+ comorbidities: CHF, seizure, mengingoma  are also affecting patient's functional outcome.   REHAB POTENTIAL: Fair    CLINICAL DECISION MAKING: Stable/uncomplicated  EVALUATION COMPLEXITY: Low   GOALS: Goals reviewed with patient? Yes  SHORT TERM GOALS: Target  date: 02/12/23 Pt core and LE strength to increase to allow pt to be able to come sit to stand without UE  Baseline: Goal status: IN PROGRESS  2.  PT to be able to walk 226 ft in 2 minutes with least assistive device.  Baseline:  Goal status: IN PROGRESS  3.  Pt to be able to single leg stance for 5 seconds B to reduce risk of falling  Baseline:  Goal status: MET    LONG TERM GOALS: Target date: 03/05/23  Pt core and LE strength to increase to allow pt to be able to go up and down 4 steps in a reciprocal manner with one handrail assist.   Baseline:  Goal status: IN PROGRESS  2.  PT to be able to walk 300 ft in 2 minutes with least assistive device Baseline:  Goal status: IN PROGRESS  3.  Pt to be able to single leg stance for 10 seconds B to reduce risk of falling  Baseline:  Goal status: IN PROGRESS     PLAN:  PT FREQUENCY: 2x/week  PT DURATION: 6 weeks  PLANNED INTERVENTIONS: Therapeutic exercises, Therapeutic activity, Neuromuscular re-education, Balance training, Gait training, Patient/Family education, and Self Care  PLAN FOR NEXT SESSION: work on balance and strengthening to progress toward goals.  Progress to standing exercises next session.   Becky Sax, LPTA/CLT; Rowe Clack [phone]  02/11/2023, 4:09 PM

## 2023-02-12 ENCOUNTER — Ambulatory Visit
Admission: RE | Admit: 2023-02-12 | Discharge: 2023-02-12 | Disposition: A | Discharge status: Home / Self Care | Payer: Medicare Other | Source: Ambulatory Visit | Attending: Family Medicine | Admitting: Family Medicine

## 2023-02-12 DIAGNOSIS — Z1231 Encounter for screening mammogram for malignant neoplasm of breast: Secondary | ICD-10-CM

## 2023-02-18 ENCOUNTER — Ambulatory Visit (HOSPITAL_COMMUNITY): Payer: Medicare Other | Admitting: Physical Therapy

## 2023-02-18 DIAGNOSIS — R1319 Other dysphagia: Secondary | ICD-10-CM | POA: Diagnosis not present

## 2023-02-18 DIAGNOSIS — R1312 Dysphagia, oropharyngeal phase: Secondary | ICD-10-CM | POA: Diagnosis not present

## 2023-02-18 DIAGNOSIS — K3184 Gastroparesis: Secondary | ICD-10-CM | POA: Diagnosis not present

## 2023-02-18 DIAGNOSIS — Z789 Other specified health status: Secondary | ICD-10-CM | POA: Diagnosis not present

## 2023-02-20 ENCOUNTER — Ambulatory Visit (HOSPITAL_COMMUNITY): Payer: Medicare Other | Admitting: Physical Therapy

## 2023-02-20 DIAGNOSIS — Z789 Other specified health status: Secondary | ICD-10-CM | POA: Diagnosis not present

## 2023-02-20 DIAGNOSIS — R262 Difficulty in walking, not elsewhere classified: Secondary | ICD-10-CM | POA: Diagnosis not present

## 2023-02-20 DIAGNOSIS — M6281 Muscle weakness (generalized): Secondary | ICD-10-CM

## 2023-02-20 DIAGNOSIS — Z7409 Other reduced mobility: Secondary | ICD-10-CM | POA: Diagnosis not present

## 2023-02-20 NOTE — Therapy (Signed)
OUTPATIENT PHYSICAL THERAPY TREATMENT  Patient Name: Tara [name] MRN: [id number] DOB:23-Aug-1946, 77 y.o., female Today's Date: 02/20/2023  END OF SESSION:  PT End of Session - 02/20/23 1600    Visit Number 5    Number of Visits 12    Date for PT Re-Evaluation 03/05/23    Authorization Type [employer] medicare    Progress Note Due on Visit 10    PT Start Time 1515    PT Stop Time 1600    PT Time Calculation (min) 45 min    Equipment Utilized During Treatment Gait belt    Activity Tolerance Patient tolerated treatment well    Behavior During Therapy WFL for tasks assessed/performed               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   Anxiety    Breast cancer (HCC) 2008   right - s/p lumpectomy->chemo, radiation   Depression    Dysrhythmia    hx SVT   Family history of colon cancer    Family history of prostate cancer    GERD (gastroesophageal reflux disease)    H/O: hysterectomy    Hiatal hernia    History of cancer chemotherapy    History of radiation therapy    Hyperlipidemia    Hypertension 01/25/2018   Nonischemic cardiomyopathy (HCC)    Personal history of radiation therapy 01/05/209   SVT (supraventricular tachycardia)    short RP SVT documented 5/14   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HCC)    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)   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   Past Surgical History:  Procedure Laterality Date   ABDOMINAL HYSTERECTOMY  1994   fibroids,    BIOPSY  06/19/2016   Procedure: BIOPSY;  Surgeon: Corbin Ade, MD;  Location: [facility];  Service: Endoscopy;;  gastric duodenum   BREAST BIOPSY Right 12/16/2006   malignant   BREAST EXCISIONAL BIOPSY Right 2018   benign lumpectomy   BREAST LUMPECTOMY Right 02/2007   BREAST LUMPECTOMY WITH RADIOACTIVE SEED LOCALIZATION Right 07/29/2017   Procedure: RIGHT  BREAST LUMPECTOMY WITH RADIOACTIVE SEED LOCALIZATION;  Surgeon: Claud Kelp, MD;  Location: [facility] OR;  Service: General;  Laterality: Right;   BREAST SURGERY Right 2008   lumpectomy, cancer   CARDIAC CATHETERIZATION     CHOLECYSTECTOMY  1999   COLONOSCOPY  2008   Dr. Darrick Penna: multiple polyps. Path not available at time of visit.    COLONOSCOPY WITH PROPOFOL N/A 04/25/2014   Dr. Darrick Penna: Multiple tubular adenomas removed. Diverticulosis. Moderate internal hemorrhoids. Next colonoscopy planned for August 2018.   CRANIOTOMY Bilateral 03/24/2022   Procedure: Bifrontal craniotomy for resection of meningioma;  Surgeon: Coletta Memos, MD;  Location: [facility] OR;  Service: Neurosurgery;  Laterality: Bilateral;   ESOPHAGOGASTRODUODENOSCOPY (EGD) WITH PROPOFOL N/A 06/19/2016   Procedure: ESOPHAGOGASTRODUODENOSCOPY (EGD) WITH PROPOFOL;  Surgeon: Corbin Ade, MD;  Location: [facility];  Service: Endoscopy;  Laterality: N/A;   MASTECTOMY W/ SENTINEL NODE BIOPSY Right 06/09/2018   Procedure: RIGHT TOTAL MASTECTOMY WITH SENTINEL LYMPH NODE BIOPSY;  Surgeon: Claud Kelp, MD;  Location: [facility] OR;  Service: General;  Laterality: Right;   POLYPECTOMY N/A 04/25/2014   Procedure: POLYPECTOMY;  Surgeon: West Bali, MD;  Location: [facility];  Service: Endoscopy;  Laterality: N/A;  Ascending and Decending  Colon x3 , Transverse colon x2, rectal   PORTACATH PLACEMENT N/A 06/09/2018   Procedure: INSERTION PORT-A-CATH;  Surgeon: Claud Kelp, MD;  Location: [facility] OR;  Service: General;  Laterality: N/A;   RIGHT/LEFT HEART CATH AND CORONARY ANGIOGRAPHY N/A 07/10/2017   Procedure: RIGHT/LEFT HEART CATH AND CORONARY ANGIOGRAPHY;  Surgeon: Laurey Morale, MD;  Location: [facility] INVASIVE CV LAB;  Service: Cardiovascular;  Laterality: N/A;   Patient Active Problem List   Diagnosis Date Noted   Impaired mobility and ADLs 01/05/2023   IGT (impaired glucose tolerance) 01/05/2023   Non-ischemic cardiomyopathy (HCC) 01/05/2023    Constipation 10/26/2022   Status post insertion of percutaneous endoscopic gastrostomy (PEG) tube (HCC) 09/20/2022   Paroxysmal atrial fibrillation (HCC) 09/20/2022   Chronic anticoagulation 09/20/2022   Hiatal hernia 09/20/2022   Encounter for support and coordination of transition of care 08/23/2022   Aspiration pneumonia (HCC) 08/14/2022   Seizure disorder (HCC)    Lethargy 05/15/2022   Dysfunction of right cardiac ventricle 05/07/2022   Acute postoperative anemia due to expected blood loss 05/07/2022   Dysphagia 05/07/2022   Palliative care encounter    Acute respiratory failure with hypoxia (HCC)    Aspiration into airway    Pressure injury of skin 04/07/2022   Meningioma (HCC) 03/24/2022   Seizure (HCC) 03/15/2022   Atrial fibrillation, chronic (HCC) 03/15/2022   Protein-calorie malnutrition, moderate (HCC) 03/15/2022   Atrial fibrillation with RVR (HCC)    Metabolic acidosis 02/27/2022   Acute renal failure superimposed on stage 3b chronic kidney disease (HCC) 02/27/2022   Anxiety and depression 02/27/2022   Meningioma, cerebral (HCC) 02/27/2022   Normocytic anemia 02/27/2022   Acute renal failure (ARF) (HCC) 02/27/2022   Hemorrhoids 01/04/2021   Ankle deformity, right 09/12/2020   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   Port-A-Cath in place 09/28/2018   Colon adenomas 08/06/2018   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 (HCC) 06/09/2018   Hypertension 01/25/2018   AKI (acute kidney injury) (HCC) 10/25/2016   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   Acute on chronic combined systolic and diastolic CHF (congestive heart failure) (HCC)    Demand ischemia    Hypokalemia 09/29/2016   Prolonged Q-T interval on ECG 09/29/2016   Chronic kidney disease (CKD), stage III (moderate) (HCC) 09/29/2016   Leukocytosis 09/18/2016   Abnormal CT scan, chest    Upper airway cough syndrome  vs Cough variant asthma  04/09/2016   Chronic cough 03/17/2016    Seasonal allergies 08/08/2014   Family history of colon cancer 03/30/2014   Osteoporosis 02/14/2014   Metabolic syndrome X 02/05/2014   SVT (supraventricular tachycardia) 03/14/2013   Chronic systolic CHF (congestive heart failure) (HCC) 03/14/2013   Vitamin D deficiency 07/23/2010   Malignant neoplasm of right breast (HCC) 12/13/2007   Hyperlipidemia 12/13/2007   Anxiety state 12/13/2007   Depression, major, single episode, 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   GERD 12/13/2007    PCP: Syliva Overman  REFERRING PROVIDER: Syliva Overman  REFERRING DIAG:  [phone] (ICD-10-CM) - Impaired mobility and ADLs    THERAPY DIAG:  Muscle weakness  Rationale for Evaluation and Treatment: Rehabilitation  ONSET DATE: 7/23  SUBJECTIVE STATEMENT: Patient states that she is completing her HEP and has no complaints.   PERTINENT HISTORY: CHF, osteoporosis, anxiety,cerebral meningioma, seizures, PEG placement in abdomen PAIN:  Are you having pain? No  PRECAUTIONS: Fall  WEIGHT BEARING RESTRICTIONS: No  FALLS:  Has patient fallen in last 6 months? No  LIVING ENVIRONMENT: Lives  with: lives with their family Lives in: House/apartment Stairs: Yes: Internal: 13 steps; on right going up and External: 3 steps; on right going up Has following equipment at home: Dan Humphreys - 2 wheeled  OCCUPATION: retired  PLOF: I prior to the seizures.   PATIENT GOALS: to be more Independent; be able to go to basement and do laundry  NEXT MD VISIT: 8/24   OBJECTIVE:   DIAGNOSTIC FINDINGS:  IMPRESSION: 1. 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 2. Diffuse pachymeningeal thickening and enhancement over both cerebral hemispheres, more prominent over the frontal lobes at the midline measuring up to 7 mm in thickness, favored to reflect evolving  postoperative change. Recommend follow-up in 3-6 months to ensure stability.     Electronically Signed   By: Lesia Hausen M.D.   On: 10/17/2022 11:03     COGNITION: Overall cognitive status: Within functional limits for tasks assessed    LOWER EXTREMITY MMT:  MMT Right eval Left eval  Hip flexion 5 4  Hip extension 3- 3-  Hip abduction 3 3  Hip adduction    Hip internal rotation    Hip external rotation    Knee flexion 3 3  Knee extension 5 5  Ankle dorsiflexion 4 4  Ankle plantarflexion    Ankle inversion    Ankle eversion     (Blank rows = not tested)  FUNCTIONAL TESTS:  30 seconds chair stand test:  unable to come sit to stand without UE assist at this time.   2 minute walk test: 168 ft with rolling walker Single leg stance:  unable    TODAY'S TREATMENT:                                                                                                                              DATE: 02/20/23 Gt with RW x 226 ft Heel raise x 10 Squat x 10 March x 10 Sit to stand x 10 Step up 4" step Rt and LT  x 10 Step down 4" rt and lt x 10 each Side step with green tband x 3 RT in // Hip extension x 10 B  Toetapping 6in with decreased HHA every set  10 Narrow base of support with no UE assist working on correcting loss of balance.   02/11/23 226RW no LOB 10 STS no HHA Standing:   Heel raise 10x 2 Toe raises decline slope 10x 2 Minisquat 10x 2 Abduction 10x 2 Tandem stance 2x 30" Sidestep front of counter 2RT Toetapping 6in with decreased HHA every set 3x 10     02/05/23  Ambulation with RW 226 feet no rest break Transferring sit to/from supine  Supine:  Bridge 2X10  SLR 2X10 each Sidelying hip abduction each 2X10 Prone:  hip extension 2X10  Knee flexion bil 2X10 Sit to stand no UE  standard chair 10X  LAQ 10X5"  PATIENT EDUCATION:  Education details: HEP Person educated: Patient and Spouse Education method: Explanation, Actor cues, and  Handouts Education comprehension: returned demonstration  HOME EXERCISE PROGRAM: Access Code: [email or web address] URL: [email or web address] Date: 01/22/2023 Prepared by: Virgina Organ  Exercises - Supine Bridge  - 1 x daily - 7 x weekly - 1 sets - 10 reps - 3" hold - Active Straight Leg Raise with Quad Set  - 1 x daily - 7 x weekly - 1 sets - 10 reps - 3" hold - Sidelying Hip Abduction  - 1 x daily - 7 x weekly - 1 sets - 10 reps - 3" hold - Seated Ankle Dorsiflexion AROM  - 1 x daily - 7 x weekly - 1 sets - 10 reps - 3" hold - Sit to Stand with Counter Support  - 1 x daily - 7 x weekly - 1 sets - 10 reps  ASSESSMENT:  CLINICAL IMPRESSION: Therapist focused on weight bearing activity.  PT continues to improve in activity tolerance.  PT continues to have decreased strength and decreased balance  decreased coordination and decrease strength.  Pt will continue to benefit from skilled PT.  OBJECTIVE IMPAIRMENTS: cardiopulmonary status limiting activity, decreased activity tolerance, decreased balance, and decreased strength.   ACTIVITY LIMITATIONS: carrying, lifting, standing, squatting, stairs, and locomotion level  PARTICIPATION LIMITATIONS: meal prep, cleaning, laundry, shopping, community activity, and church  PERSONAL FACTORS: Fitness, Past/current experiences, Time since onset of injury/illness/exacerbation, and 3+ comorbidities: CHF, seizure, mengingoma  are also affecting patient's functional outcome.   REHAB POTENTIAL: Fair    CLINICAL DECISION MAKING: Stable/uncomplicated  EVALUATION COMPLEXITY: Low   GOALS: Goals reviewed with patient? Yes  SHORT TERM GOALS: Target date: 02/12/23 Pt core and LE strength to increase to allow pt to be able to come sit to stand without UE  Baseline: Goal status: IN PROGRESS  2.  PT to be able to walk 226 ft in 2 minutes with least assistive device.  Baseline:  Goal status: IN PROGRESS  3.  Pt to be able to single leg stance for  5 seconds B to reduce risk of falling  Baseline:  Goal status: MET    LONG TERM GOALS: Target date: 03/05/23  Pt core and LE strength to increase to allow pt to be able to go up and down 4 steps in a reciprocal manner with one handrail assist.  Baseline:  Goal status: IN PROGRESS  2.  PT to be able to walk 300 ft in 2 minutes with least assistive device Baseline:  Goal status: IN PROGRESS  3.  Pt to be able to single leg stance for 10 seconds B to reduce risk of falling  Baseline:  Goal status: IN PROGRESS     PLAN:  PT FREQUENCY: 2x/week  PT DURATION: 6 weeks  PLANNED INTERVENTIONS: Therapeutic exercises, Therapeutic activity, Neuromuscular re-education, Balance training, Gait training, Patient/Family education, and Self Care  PLAN FOR NEXT SESSION: work on balance and strengthening to progress toward goals.  Progress to standing exercises next session.   Virgina Organ, PT CLT [phone]    02/20/2023, 4:00 PM

## 2023-02-23 ENCOUNTER — Ambulatory Visit (HOSPITAL_COMMUNITY): Payer: Medicare Other | Attending: Family Medicine | Admitting: Physical Therapy

## 2023-02-23 DIAGNOSIS — M6281 Muscle weakness (generalized): Secondary | ICD-10-CM | POA: Insufficient documentation

## 2023-02-23 DIAGNOSIS — R262 Difficulty in walking, not elsewhere classified: Secondary | ICD-10-CM | POA: Diagnosis not present

## 2023-02-23 NOTE — Therapy (Signed)
OUTPATIENT PHYSICAL THERAPY TREATMENT  Patient Name: Tara [name] MRN: [id number] DOB:20-May-1946, 77 y.o., female Today's Date: 02/23/2023  END OF SESSION:  PT End of Session - 02/20/23 1600    Visit Number 5    Number of Visits 12    Date for PT Re-Evaluation 03/05/23    Authorization Type [employer] medicare    Progress Note Due on Visit 10    PT Start Time 1515    PT Stop Time 1600    PT Time Calculation (min) 45 min    Equipment Utilized During Treatment Gait belt    Activity Tolerance Patient tolerated treatment well    Behavior During Therapy WFL for tasks assessed/performed               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   Anxiety    Breast cancer (HCC) 2008   right - s/p lumpectomy->chemo, radiation   Depression    Dysrhythmia    hx SVT   Family history of colon cancer    Family history of prostate cancer    GERD (gastroesophageal reflux disease)    H/O: hysterectomy    Hiatal hernia    History of cancer chemotherapy    History of radiation therapy    Hyperlipidemia    Hypertension 01/25/2018   Nonischemic cardiomyopathy (HCC)    Personal history of radiation therapy 01/05/209   SVT (supraventricular tachycardia)    short RP SVT documented 5/14   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HCC)    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)   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   Past Surgical History:  Procedure Laterality Date   ABDOMINAL HYSTERECTOMY  1994   fibroids,    BIOPSY  06/19/2016   Procedure: BIOPSY;  Surgeon: Corbin Ade, MD;  Location: [facility];  Service: Endoscopy;;  gastric duodenum   BREAST BIOPSY Right 12/16/2006   malignant   BREAST EXCISIONAL BIOPSY Right 2018   benign lumpectomy   BREAST LUMPECTOMY Right 02/2007   BREAST LUMPECTOMY WITH RADIOACTIVE SEED LOCALIZATION Right 07/29/2017   Procedure: RIGHT  BREAST LUMPECTOMY WITH RADIOACTIVE SEED LOCALIZATION;  Surgeon: Claud Kelp, MD;  Location: [facility] OR;  Service: General;  Laterality: Right;   BREAST SURGERY Right 2008   lumpectomy, cancer   CARDIAC CATHETERIZATION     CHOLECYSTECTOMY  1999   COLONOSCOPY  2008   Dr. Darrick Penna: multiple polyps. Path not available at time of visit.    COLONOSCOPY WITH PROPOFOL N/A 04/25/2014   Dr. Darrick Penna: Multiple tubular adenomas removed. Diverticulosis. Moderate internal hemorrhoids. Next colonoscopy planned for August 2018.   CRANIOTOMY Bilateral 03/24/2022   Procedure: Bifrontal craniotomy for resection of meningioma;  Surgeon: Coletta Memos, MD;  Location: [facility] OR;  Service: Neurosurgery;  Laterality: Bilateral;   ESOPHAGOGASTRODUODENOSCOPY (EGD) WITH PROPOFOL N/A 06/19/2016   Procedure: ESOPHAGOGASTRODUODENOSCOPY (EGD) WITH PROPOFOL;  Surgeon: Corbin Ade, MD;  Location: [facility];  Service: Endoscopy;  Laterality: N/A;   MASTECTOMY W/ SENTINEL NODE BIOPSY Right 06/09/2018   Procedure: RIGHT TOTAL MASTECTOMY WITH SENTINEL LYMPH NODE BIOPSY;  Surgeon: Claud Kelp, MD;  Location: [facility] OR;  Service: General;  Laterality: Right;   POLYPECTOMY N/A 04/25/2014   Procedure: POLYPECTOMY;  Surgeon: West Bali, MD;  Location: [facility];  Service: Endoscopy;  Laterality: N/A;  Ascending and Decending  Colon x3 , Transverse colon x2, rectal   PORTACATH PLACEMENT N/A 06/09/2018   Procedure: INSERTION PORT-A-CATH;  Surgeon: Claud Kelp, MD;  Location: [facility] OR;  Service: General;  Laterality: N/A;   RIGHT/LEFT HEART CATH AND CORONARY ANGIOGRAPHY N/A 07/10/2017   Procedure: RIGHT/LEFT HEART CATH AND CORONARY ANGIOGRAPHY;  Surgeon: Laurey Morale, MD;  Location: [facility] INVASIVE CV LAB;  Service: Cardiovascular;  Laterality: N/A;   Patient Active Problem List   Diagnosis Date Noted   Impaired mobility and ADLs 01/05/2023   IGT (impaired glucose tolerance) 01/05/2023   Non-ischemic cardiomyopathy (HCC) 01/05/2023    Constipation 10/26/2022   Status post insertion of percutaneous endoscopic gastrostomy (PEG) tube (HCC) 09/20/2022   Paroxysmal atrial fibrillation (HCC) 09/20/2022   Chronic anticoagulation 09/20/2022   Hiatal hernia 09/20/2022   Encounter for support and coordination of transition of care 08/23/2022   Aspiration pneumonia (HCC) 08/14/2022   Seizure disorder (HCC)    Lethargy 05/15/2022   Dysfunction of right cardiac ventricle 05/07/2022   Acute postoperative anemia due to expected blood loss 05/07/2022   Dysphagia 05/07/2022   Palliative care encounter    Acute respiratory failure with hypoxia (HCC)    Aspiration into airway    Pressure injury of skin 04/07/2022   Meningioma (HCC) 03/24/2022   Seizure (HCC) 03/15/2022   Atrial fibrillation, chronic (HCC) 03/15/2022   Protein-calorie malnutrition, moderate (HCC) 03/15/2022   Atrial fibrillation with RVR (HCC)    Metabolic acidosis 02/27/2022   Acute renal failure superimposed on stage 3b chronic kidney disease (HCC) 02/27/2022   Anxiety and depression 02/27/2022   Meningioma, cerebral (HCC) 02/27/2022   Normocytic anemia 02/27/2022   Acute renal failure (ARF) (HCC) 02/27/2022   Hemorrhoids 01/04/2021   Ankle deformity, right 09/12/2020   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   Port-A-Cath in place 09/28/2018   Colon adenomas 08/06/2018   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 (HCC) 06/09/2018   Hypertension 01/25/2018   AKI (acute kidney injury) (HCC) 10/25/2016   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   Acute on chronic combined systolic and diastolic CHF (congestive heart failure) (HCC)    Demand ischemia    Hypokalemia 09/29/2016   Prolonged Q-T interval on ECG 09/29/2016   Chronic kidney disease (CKD), stage III (moderate) (HCC) 09/29/2016   Leukocytosis 09/18/2016   Abnormal CT scan, chest    Upper airway cough syndrome  vs Cough variant asthma  04/09/2016   Chronic cough 03/17/2016    Seasonal allergies 08/08/2014   Family history of colon cancer 03/30/2014   Osteoporosis 02/14/2014   Metabolic syndrome X 02/05/2014   SVT (supraventricular tachycardia) 03/14/2013   Chronic systolic CHF (congestive heart failure) (HCC) 03/14/2013   Vitamin D deficiency 07/23/2010   Malignant neoplasm of right breast (HCC) 12/13/2007   Hyperlipidemia 12/13/2007   Anxiety state 12/13/2007   Depression, major, single episode, 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   GERD 12/13/2007    PCP: Syliva Overman  REFERRING PROVIDER: Syliva Overman  REFERRING DIAG:  [phone] (ICD-10-CM) - Impaired mobility and ADLs    THERAPY DIAG:  Muscle weakness  Rationale for Evaluation and Treatment: Rehabilitation  ONSET DATE: 7/23  SUBJECTIVE STATEMENT: Patient states that she is completing her HEP and has no complaints.   PERTINENT HISTORY: CHF, osteoporosis, anxiety,cerebral meningioma, seizures, PEG placement in abdomen PAIN:  Are you having pain? No  PRECAUTIONS: Fall  WEIGHT BEARING RESTRICTIONS: No  FALLS:  Has patient fallen in last 6 months? No  LIVING ENVIRONMENT: Lives  with: lives with their family Lives in: House/apartment Stairs: Yes: Internal: 13 steps; on right going up and External: 3 steps; on right going up Has following equipment at home: Dan Humphreys - 2 wheeled  OCCUPATION: retired  PLOF: I prior to the seizures.   PATIENT GOALS: to be more Independent; be able to go to basement and do laundry  NEXT MD VISIT: 8/24   OBJECTIVE:   DIAGNOSTIC FINDINGS:  IMPRESSION: 1. 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 2. Diffuse pachymeningeal thickening and enhancement over both cerebral hemispheres, more prominent over the frontal lobes at the midline measuring up to 7 mm in thickness, favored to reflect evolving  postoperative change. Recommend follow-up in 3-6 months to ensure stability.     Electronically Signed   By: Lesia Hausen M.D.   On: 10/17/2022 11:03     COGNITION: Overall cognitive status: Within functional limits for tasks assessed    LOWER EXTREMITY MMT:  MMT Right eval Left eval  Hip flexion 5 4  Hip extension 3- 3-  Hip abduction 3 3  Hip adduction    Hip internal rotation    Hip external rotation    Knee flexion 3 3  Knee extension 5 5  Ankle dorsiflexion 4 4  Ankle plantarflexion    Ankle inversion    Ankle eversion     (Blank rows = not tested)  FUNCTIONAL TESTS:  30 seconds chair stand test:  unable to come sit to stand without UE assist at this time.   2 minute walk test: 168 ft with rolling walker Single leg stance:  unable    TODAY'S TREATMENT:                                                                                                                              DATE: 02/23/23: Heel raises x 15 Squat x 15 one hand hold only  March x 15 one hand hold only  Step up 4" one hand only x 10 B Step down 4" rt and lt x 10 each Side step with green tband x 3 RT in // Sit to stand x 15  NBS for balance  02/20/23 Gt with RW x 226 ft Heel raise x 10 Squat x 10 March x 10 Sit to stand x 10 Step up 4" step Rt and LT  x 10 Step down 4" rt and lt x 10 each Side step with green tband x 3 RT in // Hip extension x 10 B  Toetapping 6in with decreased HHA every set  10 Narrow base of support with no UE assist working on correcting loss of balance.   02/11/23 226RW no LOB 10 STS no HHA Standing:   Heel raise 10x 2 Toe raises decline slope 10x 2 Minisquat 10x 2 Abduction 10x 2 Tandem stance 2x 30" Sidestep front  of counter 2RT Toetapping 6in with decreased HHA every set 3x 10     02/05/23  Ambulation with RW 226 feet no rest break Transferring sit to/from supine  Supine:  Bridge 2X10  SLR 2X10 each Sidelying hip abduction each 2X10 Prone:   hip extension 2X10  Knee flexion bil 2X10 Sit to stand no UE standard chair 10X  LAQ 10X5"  PATIENT EDUCATION:  Education details: HEP Person educated: Patient and Spouse Education method: Explanation, Actor cues, and Handouts Education comprehension: returned demonstration  HOME EXERCISE PROGRAM: Access Code: [email or web address] URL: [email or web address] Date: 01/22/2023 Prepared by: Virgina Organ  Exercises - Supine Bridge  - 1 x daily - 7 x weekly - 1 sets - 10 reps - 3" hold - Active Straight Leg Raise with Quad Set  - 1 x daily - 7 x weekly - 1 sets - 10 reps - 3" hold - Sidelying Hip Abduction  - 1 x daily - 7 x weekly - 1 sets - 10 reps - 3" hold - Seated Ankle Dorsiflexion AROM  - 1 x daily - 7 x weekly - 1 sets - 10 reps - 3" hold - Sit to Stand with Counter Support  - 1 x daily - 7 x weekly - 1 sets - 10 reps  ASSESSMENT:  CLINICAL IMPRESSION: Therapist continued to focus on weight bearing activity. Pt main difficulty at this time is balance.  .  PT continues to have decreased strength and decreased balance  decreased coordination and decrease strength.  Pt will continue to benefit from skilled PT.  OBJECTIVE IMPAIRMENTS: cardiopulmonary status limiting activity, decreased activity tolerance, decreased balance, and decreased strength.   ACTIVITY LIMITATIONS: carrying, lifting, standing, squatting, stairs, and locomotion level  PARTICIPATION LIMITATIONS: meal prep, cleaning, laundry, shopping, community activity, and church  PERSONAL FACTORS: Fitness, Past/current experiences, Time since onset of injury/illness/exacerbation, and 3+ comorbidities: CHF, seizure, mengingoma  are also affecting patient's functional outcome.   REHAB POTENTIAL: Fair    CLINICAL DECISION MAKING: Stable/uncomplicated  EVALUATION COMPLEXITY: Low   GOALS: Goals reviewed with patient? Yes  SHORT TERM GOALS: Target date: 02/12/23 Pt core and LE strength to increase to allow pt to be  able to come sit to stand without UE  Baseline: Goal status: IN PROGRESS  2.  PT to be able to walk 226 ft in 2 minutes with least assistive device.  Baseline:  Goal status: IN PROGRESS  3.  Pt to be able to single leg stance for 5 seconds B to reduce risk of falling  Baseline:  Goal status: MET    LONG TERM GOALS: Target date: 03/05/23  Pt core and LE strength to increase to allow pt to be able to go up and down 4 steps in a reciprocal manner with one handrail assist.  Baseline:  Goal status: IN PROGRESS  2.  PT to be able to walk 300 ft in 2 minutes with least assistive device Baseline:  Goal status: IN PROGRESS  3.  Pt to be able to single leg stance for 10 seconds B to reduce risk of falling  Baseline:  Goal status: IN PROGRESS     PLAN:  PT FREQUENCY: 2x/week  PT DURATION: 6 weeks  PLANNED INTERVENTIONS: Therapeutic exercises, Therapeutic activity, Neuromuscular re-education, Balance training, Gait training, Patient/Family education, and Self Care  PLAN FOR NEXT SESSION: work on balance and strengthening to progress toward goals.  Progress to standing exercises next session.   Virgina Organ, PT CLT [phone]    02/23/2023,  3:15  PM

## 2023-02-26 DIAGNOSIS — H2511 Age-related nuclear cataract, right eye: Secondary | ICD-10-CM | POA: Diagnosis not present

## 2023-03-04 ENCOUNTER — Other Ambulatory Visit: Payer: Self-pay | Admitting: Family Medicine

## 2023-03-04 ENCOUNTER — Ambulatory Visit (HOSPITAL_COMMUNITY): Payer: Medicare Other | Admitting: Physical Therapy

## 2023-03-04 ENCOUNTER — Encounter (HOSPITAL_COMMUNITY): Payer: Medicare Other | Admitting: Physical Therapy

## 2023-03-04 ENCOUNTER — Telehealth (HOSPITAL_COMMUNITY): Payer: Self-pay | Admitting: Physical Therapy

## 2023-03-04 ENCOUNTER — Other Ambulatory Visit: Payer: Self-pay | Admitting: Neurology

## 2023-03-04 DIAGNOSIS — Z961 Presence of intraocular lens: Secondary | ICD-10-CM | POA: Diagnosis not present

## 2023-03-04 DIAGNOSIS — H25812 Combined forms of age-related cataract, left eye: Secondary | ICD-10-CM | POA: Diagnosis not present

## 2023-03-04 MED ORDER — CLONAZEPAM 0.5 MG PO TABS: 0.5000 mg | ORAL_TABLET | Freq: Every day | ORAL | 5 refills | Status: DC

## 2023-03-04 NOTE — Telephone Encounter (Signed)
Pt did not show for appt.  Called given number with no answer and no voice mail available.  Lurena Nida, PTA/CLT [facility] Ph: [phone]

## 2023-03-04 NOTE — Progress Notes (Signed)
Refill request sent for klonopin

## 2023-03-05 DIAGNOSIS — F419 Anxiety disorder, unspecified: Secondary | ICD-10-CM | POA: Diagnosis not present

## 2023-03-05 DIAGNOSIS — I4891 Unspecified atrial fibrillation: Secondary | ICD-10-CM | POA: Diagnosis not present

## 2023-03-05 DIAGNOSIS — E872 Acidosis, unspecified: Secondary | ICD-10-CM | POA: Diagnosis not present

## 2023-03-05 DIAGNOSIS — N179 Acute kidney failure, unspecified: Secondary | ICD-10-CM | POA: Diagnosis not present

## 2023-03-11 ENCOUNTER — Ambulatory Visit (HOSPITAL_COMMUNITY): Payer: Medicare Other | Admitting: Physical Therapy

## 2023-03-11 DIAGNOSIS — R262 Difficulty in walking, not elsewhere classified: Secondary | ICD-10-CM | POA: Diagnosis not present

## 2023-03-11 DIAGNOSIS — M6281 Muscle weakness (generalized): Secondary | ICD-10-CM | POA: Diagnosis not present

## 2023-03-11 NOTE — Therapy (Addendum)
OUTPATIENT PHYSICAL THERAPY TREATMENT Progress Note Reporting Period 01/22/23 to 03/11/23  See note below for Objective Data and Assessment of Progress/Goals.      Patient Name: Tara [name] MRN: [id number] DOB:1946/03/09, 77 y.o., female Today's Date: 03/11/2023  END OF SESSION:   PT End of Session - 03/11/23 1427     Visit Number 7    Number of Visits 15    Date for PT Re-Evaluation 04/03/23    Authorization Type [employer] medicare    Progress Note Due on Visit 17    PT Start Time 1430    PT Stop Time 1510    PT Time Calculation (min) 40 min    Equipment Utilized During Treatment Gait belt    Activity Tolerance Patient tolerated treatment well    Behavior During Therapy WFL for tasks assessed/performed              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   Anxiety    Breast cancer (HCC) 2008   right - s/p lumpectomy->chemo, radiation   Depression    Dysrhythmia    hx SVT   Family history of colon cancer    Family history of prostate cancer    GERD (gastroesophageal reflux disease)    H/O: hysterectomy    Hiatal hernia    History of cancer chemotherapy    History of radiation therapy    Hyperlipidemia    Hypertension 01/25/2018   Nonischemic cardiomyopathy (HCC)    Personal history of radiation therapy 01/05/209   SVT (supraventricular tachycardia)    short RP SVT documented 5/14   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HCC)    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)   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   Past Surgical History:  Procedure Laterality Date   ABDOMINAL HYSTERECTOMY  1994   fibroids,    BIOPSY  06/19/2016   Procedure: BIOPSY;  Surgeon: Corbin Ade, MD;  Location: [facility];  Service: Endoscopy;;  gastric duodenum   BREAST BIOPSY Right 12/16/2006   malignant   BREAST EXCISIONAL BIOPSY Right 2018   benign  lumpectomy   BREAST LUMPECTOMY Right 02/2007   BREAST LUMPECTOMY WITH RADIOACTIVE SEED LOCALIZATION Right 07/29/2017   Procedure: RIGHT BREAST LUMPECTOMY WITH RADIOACTIVE SEED LOCALIZATION;  Surgeon: Claud Kelp, MD;  Location: [facility] OR;  Service: General;  Laterality: Right;   BREAST SURGERY Right 2008   lumpectomy, cancer   CARDIAC CATHETERIZATION     CHOLECYSTECTOMY  1999   COLONOSCOPY  2008   Dr. Darrick Penna: multiple polyps. Path not available at time of visit.    COLONOSCOPY WITH PROPOFOL N/A 04/25/2014   Dr. Darrick Penna: Multiple tubular adenomas removed. Diverticulosis. Moderate internal hemorrhoids. Next colonoscopy planned for August 2018.   CRANIOTOMY Bilateral 03/24/2022   Procedure: Bifrontal craniotomy for resection of meningioma;  Surgeon: Coletta Memos, MD;  Location: [facility] OR;  Service: Neurosurgery;  Laterality: Bilateral;   ESOPHAGOGASTRODUODENOSCOPY (EGD) WITH PROPOFOL N/A 06/19/2016   Procedure: ESOPHAGOGASTRODUODENOSCOPY (EGD) WITH PROPOFOL;  Surgeon: Corbin Ade, MD;  Location: [facility];  Service: Endoscopy;  Laterality: N/A;   MASTECTOMY W/ SENTINEL NODE BIOPSY Right 06/09/2018   Procedure: RIGHT TOTAL MASTECTOMY WITH SENTINEL LYMPH NODE BIOPSY;  Surgeon: Claud Kelp, MD;  Location: [facility] OR;  Service: General;  Laterality: Right;   POLYPECTOMY N/A 04/25/2014  Procedure: POLYPECTOMY;  Surgeon: West Bali, MD;  Location: [facility];  Service: Endoscopy;  Laterality: N/A;  Ascending and Decending Colon x3 , Transverse colon x2, rectal   PORTACATH PLACEMENT N/A 06/09/2018   Procedure: INSERTION PORT-A-CATH;  Surgeon: Claud Kelp, MD;  Location: [facility] OR;  Service: General;  Laterality: N/A;   RIGHT/LEFT HEART CATH AND CORONARY ANGIOGRAPHY N/A 07/10/2017   Procedure: RIGHT/LEFT HEART CATH AND CORONARY ANGIOGRAPHY;  Surgeon: Laurey Morale, MD;  Location: [facility];  Service: Cardiovascular;  Laterality: N/A;   Patient Active Problem List   Diagnosis Date Noted    Impaired mobility and ADLs 01/05/2023   IGT (impaired glucose tolerance) 01/05/2023   Non-ischemic cardiomyopathy (HCC) 01/05/2023   Constipation 10/26/2022   Status post insertion of percutaneous endoscopic gastrostomy (PEG) tube (HCC) 09/20/2022   Paroxysmal atrial fibrillation (HCC) 09/20/2022   Chronic anticoagulation 09/20/2022   Hiatal hernia 09/20/2022   Encounter for support and coordination of transition of care 08/23/2022   Aspiration pneumonia (HCC) 08/14/2022   Seizure disorder (HCC)    Lethargy 05/15/2022   Dysfunction of right cardiac ventricle 05/07/2022   Acute postoperative anemia due to expected blood loss 05/07/2022   Dysphagia 05/07/2022   Palliative care encounter    Acute respiratory failure with hypoxia (HCC)    Aspiration into airway    Pressure injury of skin 04/07/2022   Meningioma (HCC) 03/24/2022   Seizure (HCC) 03/15/2022   Atrial fibrillation, chronic (HCC) 03/15/2022   Protein-calorie malnutrition, moderate (HCC) 03/15/2022   Atrial fibrillation with RVR (HCC)    Metabolic acidosis 02/27/2022   Acute renal failure superimposed on stage 3b chronic kidney disease (HCC) 02/27/2022   Anxiety and depression 02/27/2022   Meningioma, cerebral (HCC) 02/27/2022   Normocytic anemia 02/27/2022   Acute renal failure (ARF) (HCC) 02/27/2022   Hemorrhoids 01/04/2021   Ankle deformity, right 09/12/2020   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   Port-A-Cath in place 09/28/2018   Colon adenomas 08/06/2018   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 (HCC) 06/09/2018   Hypertension 01/25/2018   AKI (acute kidney injury) (HCC) 10/25/2016   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   Acute on chronic combined systolic and diastolic CHF (congestive heart failure) (HCC)    Demand ischemia    Hypokalemia 09/29/2016   Prolonged Q-T interval on ECG 09/29/2016   Chronic kidney disease (CKD), stage III (moderate) (HCC) 09/29/2016   Leukocytosis  09/18/2016   Abnormal CT scan, chest    Upper airway cough syndrome  vs Cough variant asthma  04/09/2016   Chronic cough 03/17/2016   Seasonal allergies 08/08/2014   Family history of colon cancer 03/30/2014   Osteoporosis 02/14/2014   Metabolic syndrome X 02/05/2014   SVT (supraventricular tachycardia) 03/14/2013   Chronic systolic CHF (congestive heart failure) (HCC) 03/14/2013   Vitamin D deficiency 07/23/2010   Malignant neoplasm of right breast (HCC) 12/13/2007   Hyperlipidemia 12/13/2007   Anxiety state 12/13/2007   Depression, major, single episode, 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   GERD 12/13/2007    PCP: Syliva Overman  REFERRING PROVIDER: Syliva Overman  REFERRING DIAG:  [phone] (ICD-10-CM) - Impaired mobility and ADLs    THERAPY DIAG:  Muscle weakness  Rationale for Evaluation and Treatment: Rehabilitation  ONSET DATE: 7/23  SUBJECTIVE STATEMENT: Patient states she arrived late to her last appt and was unable to be seen.  States she had cataract surgery Rt eye and will be having on her Lt soon.  States  she feels she is getting stronger, 97% better. Completing her HEP and has no complaints.  Walking some without the walker at home without any falls or near falls.   PERTINENT HISTORY: CHF, osteoporosis, anxiety,cerebral meningioma, seizures, PEG placement in abdomen PAIN:  Are you having pain? No  PRECAUTIONS: Fall  WEIGHT BEARING RESTRICTIONS: No  FALLS:  Has patient fallen in last 6 months? No  LIVING ENVIRONMENT: Lives with: lives with their family Lives in: House/apartment Stairs: Yes: Internal: 13 steps; on right going up and External: 3 steps; on right going up Has following equipment at home: Dan Humphreys - 2 wheeled  OCCUPATION: retired  PLOF: INDEPENDENT prior to the seizures.   PATIENT GOALS: to be more Independent; be able to go to basement and do laundry  NEXT MD VISIT: 8/24   OBJECTIVE:   DIAGNOSTIC FINDINGS:   IMPRESSION: 1. 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 2. Diffuse pachymeningeal thickening and enhancement over both cerebral hemispheres, more prominent over the frontal lobes at the midline measuring up to 7 mm in thickness, favored to reflect evolving postoperative change. Recommend follow-up in 3-6 months to ensure stability.     Electronically Signed   By: Lesia Hausen M.D.   On: 10/17/2022 11:03     COGNITION: Overall cognitive status: Within functional limits for tasks assessed    LOWER EXTREMITY MMT:  MMT Right eval Left eval Right 03/11/23 Left  03/11/23  Hip flexion 5 4 5  4+  Hip extension 3- 3- 3 3  Hip abduction 3 3 3+ 3+  Hip adduction      Hip internal rotation      Hip external rotation      Knee flexion 3 3 4 4   Knee extension 5 5    Ankle dorsiflexion 4 4 4+ 4+  Ankle plantarflexion      Ankle inversion      Ankle eversion       (Blank rows = not tested)  FUNCTIONAL TESTS:  03/11/23 30 seconds chair stand test:  8X no UE   2 minute walk test: 226 ft with rolling walker Single leg stance:  Rt: 1" , Lt: 2"   Evaluation:  30 seconds chair stand test:  unable to come sit to stand without UE assist at this time.   2 minute walk test: 168 ft with rolling walker Single leg stance:  unable    TODAY'S TREATMENT:                                                                                                                              DATE: 03/11/23 FUNCTIONAL TESTS:  30 seconds chair stand test:  6/19: 8X no UE from standard chair (was unable to come sit to stand without UE assist)  2 minute walk test: 6/19:  226 feet with RW (was 168  ft with rolling walker at evaluation) Single leg stance: 6/19:  Rt: 1", Lt:2" max no HHA (was unable at evaluation)  MMT see above  Goal review see  below  02/23/23: Heel raises x 15 Squat x 15 one hand hold only  March x 15 one hand hold only  Step up 4" one hand only x 10 B Step down 4" rt and lt x 10 each Side step with green tband x 3 RT in // Sit to stand x 15  NBS for balance   02/20/23 Gt with RW x 226 ft Heel raise x 10 Squat x 10 March x 10 Sit to stand x 10 Step up 4" step Rt and LT  x 10 Step down 4" rt and lt x 10 each Side step with green tband x 3 RT in // Hip extension x 10 B  Toetapping 6in with decreased HHA every set  10 Narrow base of support with no UE assist working on correcting loss of balance.   02/11/23 226RW no LOB 10 STS no HHA Standing:   Heel raise 10x 2 Toe raises decline slope 10x 2 Minisquat 10x 2 Abduction 10x 2 Tandem stance 2x 30" Sidestep front of counter 2RT Toetapping 6in with decreased HHA every set 3x 10   02/05/23  Ambulation with RW 226 feet no rest break Transferring sit to/from supine  Supine:  Bridge 2X10  SLR 2X10 each Sidelying hip abduction each 2X10 Prone:  hip extension 2X10  Knee flexion bil 2X10 Sit to stand no UE standard chair 10X  LAQ 10X5"  PATIENT EDUCATION:  Education details: HEP Person educated: Patient and Spouse Education method: Explanation, Actor cues, and Handouts Education comprehension: returned demonstration  HOME EXERCISE PROGRAM: Access Code: [email or web address] URL: [email or web address] Date: 01/22/2023 Prepared by: Virgina Organ Exercises - Supine Bridge  - 1 x daily - 7 x weekly - 1 sets - 10 reps - 3" hold - Active Straight Leg Raise with Quad Set  - 1 x daily - 7 x weekly - 1 sets - 10 reps - 3" hold - Sidelying Hip Abduction  - 1 x daily - 7 x weekly - 1 sets - 10 reps - 3" hold - Seated Ankle Dorsiflexion AROM  - 1 x daily - 7 x weekly - 1 sets - 10 reps - 3" hold - Sit to Stand with Counter Support  - 1 x daily - 7 x weekly - 1 sets - 10 reps  ASSESSMENT:  CLINICAL IMPRESSION: Progress note completed this  session.  Pt has made great gains in functional strength and measures.  Pt was unable to complete a SLS without UE and now she can briefly stand 1-2 seconds.  Pt has met 2/3 short term goals and is progressing well toward long term goals.  Pt would like to be walking without and AD and be able to negotiate her 13 basement stairs safely.  Pt will benefit from continued therapy to focus on improving LE strength and stability to achieve remaining goals.    OBJECTIVE IMPAIRMENTS: cardiopulmonary status limiting activity, decreased activity tolerance, decreased balance, and decreased strength.   ACTIVITY LIMITATIONS: carrying, lifting, standing, squatting, stairs, and locomotion level  PARTICIPATION LIMITATIONS: meal prep, cleaning, laundry, shopping, community activity, and church  PERSONAL FACTORS: Fitness, Past/current experiences, Time since onset of injury/illness/exacerbation, and 3+ comorbidities: CHF, seizure, mengingoma  are also affecting patient's functional outcome.   REHAB POTENTIAL: Fair    CLINICAL DECISION MAKING: Stable/uncomplicated  EVALUATION COMPLEXITY: Low  GOALS: Goals reviewed with patient? Yes  SHORT TERM GOALS: Target date: 02/12/23 Pt core and LE strength to increase to allow pt to be able to come sit to stand without UE  Baseline: Goal status: MET  2.  PT to be able to walk 226 ft in 2 minutes with least assistive device.  Baseline:  Goal status: MET  3.  Pt to be able to single leg stance for 5 seconds B to reduce risk of falling  Baseline:  Goal status: IN PROGRESS    LONG TERM GOALS: Target date: 03/05/23  Pt core and LE strength to increase to allow pt to be able to go up and down 4 steps in a reciprocal manner with one handrail assist.  Baseline:  Goal status: IN PROGRESS  2.  PT to be able to walk 300 ft in 2 minutes with least assistive device Baseline:  Goal status: IN PROGRESS  3.  Pt to be able to single leg stance for 10 seconds B to  reduce risk of falling  Baseline:  Goal status: IN PROGRESS     PLAN:  PT FREQUENCY: 2x/week  PT DURATION: 4 weeks  PLANNED INTERVENTIONS: Therapeutic exercises, Therapeutic activity, Neuromuscular re-education, Balance training, Gait training, Patient/Family education, and Self Care  PLAN FOR NEXT SESSION: work on balance and strengthening to progress toward goals.  Progress to standing exercises next session.  Begin gait with LRAD, balance and stair negotiation.   Lurena Nida, PTA/CLT [facility] Ph: [phone]  Virgina Organ, PT CLT [phone]  03/11/2023, 3:21 PM

## 2023-03-12 DIAGNOSIS — R1312 Dysphagia, oropharyngeal phase: Secondary | ICD-10-CM | POA: Diagnosis not present

## 2023-03-12 DIAGNOSIS — D329 Benign neoplasm of meninges, unspecified: Secondary | ICD-10-CM | POA: Diagnosis not present

## 2023-03-12 DIAGNOSIS — R1314 Dysphagia, pharyngoesophageal phase: Secondary | ICD-10-CM | POA: Diagnosis not present

## 2023-03-12 DIAGNOSIS — E440 Moderate protein-calorie malnutrition: Secondary | ICD-10-CM | POA: Diagnosis not present

## 2023-03-13 ENCOUNTER — Ambulatory Visit (HOSPITAL_COMMUNITY): Payer: Medicare Other | Admitting: Physical Therapy

## 2023-03-13 DIAGNOSIS — M6281 Muscle weakness (generalized): Secondary | ICD-10-CM

## 2023-03-13 DIAGNOSIS — R262 Difficulty in walking, not elsewhere classified: Secondary | ICD-10-CM | POA: Diagnosis not present

## 2023-03-13 NOTE — Therapy (Signed)
OUTPATIENT PHYSICAL THERAPY TREATMENT     Patient Name: Tara [name] MRN: [id number] DOB:[birth year], 77 y.o., female Today's Date: 03/13/2023  END OF SESSION:   PT End of Session - 03/13/23 1124     Visit Number 8    Number of Visits 15    Date for PT Re-Evaluation 04/03/23    Authorization Type [employer] medicare    Progress Note Due on Visit 17    PT Start Time 1040    PT Stop Time 1124    PT Time Calculation (min) 44 min    Equipment Utilized During Treatment Gait belt    Activity Tolerance Patient tolerated treatment well    Behavior During Therapy WFL for tasks assessed/performed              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   Anxiety    Breast cancer (HCC) 2008   right - s/p lumpectomy->chemo, radiation   Depression    Dysrhythmia    hx SVT   Family history of colon cancer    Family history of prostate cancer    GERD (gastroesophageal reflux disease)    H/O: hysterectomy    Hiatal hernia    History of cancer chemotherapy    History of radiation therapy    Hyperlipidemia    Hypertension 01/25/2018   Nonischemic cardiomyopathy (HCC)    Personal history of radiation therapy 01/05/209   SVT (supraventricular tachycardia)    short RP SVT documented 5/14   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HCC)    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)   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   Past Surgical History:  Procedure Laterality Date   ABDOMINAL HYSTERECTOMY  1994   fibroids,    BIOPSY  06/19/2016   Procedure: BIOPSY;  Surgeon: Corbin Ade, MD;  Location: [facility];  Service: Endoscopy;;  gastric duodenum   BREAST BIOPSY Right 12/16/2006   malignant   BREAST EXCISIONAL BIOPSY Right 2018   benign lumpectomy   BREAST LUMPECTOMY Right 02/2007   BREAST LUMPECTOMY WITH RADIOACTIVE SEED LOCALIZATION Right 07/29/2017   Procedure:  RIGHT BREAST LUMPECTOMY WITH RADIOACTIVE SEED LOCALIZATION;  Surgeon: Claud Kelp, MD;  Location: [facility] OR;  Service: General;  Laterality: Right;   BREAST SURGERY Right 2008   lumpectomy, cancer   CARDIAC CATHETERIZATION     CHOLECYSTECTOMY  1999   COLONOSCOPY  2008   Dr. Darrick Penna: multiple polyps. Path not available at time of visit.    COLONOSCOPY WITH PROPOFOL N/A 04/25/2014   Dr. Darrick Penna: Multiple tubular adenomas removed. Diverticulosis. Moderate internal hemorrhoids. Next colonoscopy planned for August 2018.   CRANIOTOMY Bilateral 03/24/2022   Procedure: Bifrontal craniotomy for resection of meningioma;  Surgeon: Coletta Memos, MD;  Location: [facility] OR;  Service: Neurosurgery;  Laterality: Bilateral;   ESOPHAGOGASTRODUODENOSCOPY (EGD) WITH PROPOFOL N/A 06/19/2016   Procedure: ESOPHAGOGASTRODUODENOSCOPY (EGD) WITH PROPOFOL;  Surgeon: Corbin Ade, MD;  Location: [facility];  Service: Endoscopy;  Laterality: N/A;   MASTECTOMY W/ SENTINEL NODE BIOPSY Right 06/09/2018   Procedure: RIGHT TOTAL MASTECTOMY WITH SENTINEL LYMPH NODE BIOPSY;  Surgeon: Claud Kelp, MD;  Location: [facility] OR;  Service: General;  Laterality: Right;   POLYPECTOMY N/A 04/25/2014   Procedure: POLYPECTOMY;  Surgeon: West Bali, MD;  Location: [facility];  Service: Endoscopy;  Laterality: N/A;  Ascending and Decending Colon x3 , Transverse colon x2, rectal   PORTACATH PLACEMENT N/A 06/09/2018   Procedure: INSERTION PORT-A-CATH;  Surgeon: Claud Kelp, MD;  Location: [facility] OR;  Service: General;  Laterality: N/A;   RIGHT/LEFT HEART CATH AND CORONARY ANGIOGRAPHY N/A 07/10/2017   Procedure: RIGHT/LEFT HEART CATH AND CORONARY ANGIOGRAPHY;  Surgeon: Laurey Morale, MD;  Location: [facility] INVASIVE CV LAB;  Service: Cardiovascular;  Laterality: N/A;   Patient Active Problem List   Diagnosis Date Noted   Impaired mobility and ADLs 01/05/2023   IGT (impaired glucose tolerance) 01/05/2023   Non-ischemic cardiomyopathy (HCC) 01/05/2023    Constipation 10/26/2022   Status post insertion of percutaneous endoscopic gastrostomy (PEG) tube (HCC) 09/20/2022   Paroxysmal atrial fibrillation (HCC) 09/20/2022   Chronic anticoagulation 09/20/2022   Hiatal hernia 09/20/2022   Encounter for support and coordination of transition of care 08/23/2022   Aspiration pneumonia (HCC) 08/14/2022   Seizure disorder (HCC)    Lethargy 05/15/2022   Dysfunction of right cardiac ventricle 05/07/2022   Acute postoperative anemia due to expected blood loss 05/07/2022   Dysphagia 05/07/2022   Palliative care encounter    Acute respiratory failure with hypoxia (HCC)    Aspiration into airway    Pressure injury of skin 04/07/2022   Meningioma (HCC) 03/24/2022   Seizure (HCC) 03/15/2022   Atrial fibrillation, chronic (HCC) 03/15/2022   Protein-calorie malnutrition, moderate (HCC) 03/15/2022   Atrial fibrillation with RVR (HCC)    Metabolic acidosis 02/27/2022   Acute renal failure superimposed on stage 3b chronic kidney disease (HCC) 02/27/2022   Anxiety and depression 02/27/2022   Meningioma, cerebral (HCC) 02/27/2022   Normocytic anemia 02/27/2022   Acute renal failure (ARF) (HCC) 02/27/2022   Hemorrhoids 01/04/2021   Ankle deformity, right 09/12/2020   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   Port-A-Cath in place 09/28/2018   Colon adenomas 08/06/2018   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 (HCC) 06/09/2018   Hypertension 01/25/2018   AKI (acute kidney injury) (HCC) 10/25/2016   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   Acute on chronic combined systolic and diastolic CHF (congestive heart failure) (HCC)    Demand ischemia    Hypokalemia 09/29/2016   Prolonged Q-T interval on ECG 09/29/2016   Chronic kidney disease (CKD), stage III (moderate) (HCC) 09/29/2016   Leukocytosis 09/18/2016   Abnormal CT scan, chest    Upper airway cough syndrome  vs Cough variant asthma  04/09/2016   Chronic cough 03/17/2016    Seasonal allergies 08/08/2014   Family history of colon cancer 03/30/2014   Osteoporosis 02/14/2014   Metabolic syndrome X 02/05/2014   SVT (supraventricular tachycardia) 03/14/2013   Chronic systolic CHF (congestive heart failure) (HCC) 03/14/2013   Vitamin D deficiency 07/23/2010   Malignant neoplasm of right breast (HCC) 12/13/2007   Hyperlipidemia 12/13/2007   Anxiety state 12/13/2007   Depression, major, single episode, 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   GERD 12/13/2007    PCP: Syliva Overman  REFERRING PROVIDER: Syliva Overman  REFERRING DIAG:  [phone] (ICD-10-CM) - Impaired mobility and ADLs    THERAPY DIAG:  Muscle weakness  Rationale for Evaluation and Treatment: Rehabilitation  ONSET DATE: 7/23  SUBJECTIVE STATEMENT:  Pt states that sit to stand is the most difficult thing to do at home.  She has no pain and is completing her HEp    PERTINENT HISTORY: CHF, osteoporosis, anxiety,cerebral meningioma, seizures, PEG placement in abdomen PAIN:  Are you having pain? No  PRECAUTIONS: Fall  WEIGHT  BEARING RESTRICTIONS: No  FALLS:  Has patient fallen in last 6 months? No  LIVING ENVIRONMENT: Lives with: lives with their family Lives in: House/apartment Stairs: Yes: Internal: 13 steps; on right going up and External: 3 steps; on right going up Has following equipment at home: Dan Humphreys - 2 wheeled  OCCUPATION: retired  PLOF: INDEPENDENT prior to the seizures.   PATIENT GOALS: to be more Independent; be able to go to basement and do laundry  NEXT MD VISIT: 8/24   OBJECTIVE:   DIAGNOSTIC FINDINGS:  IMPRESSION: 1. 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 2. Diffuse pachymeningeal thickening and enhancement over both cerebral hemispheres, more prominent over the frontal lobes at the midline  measuring up to 7 mm in thickness, favored to reflect evolving postoperative change. Recommend follow-up in 3-6 months to ensure stability.     Electronically Signed   By: Lesia Hausen M.D.   On: 10/17/2022 11:03     COGNITION: Overall cognitive status: Within functional limits for tasks assessed    LOWER EXTREMITY MMT:  MMT Right eval Left eval Right 03/11/23 Left  03/11/23  Hip flexion 5 4 5  4+  Hip extension 3- 3- 3 3  Hip abduction 3 3 3+ 3+  Hip adduction      Hip internal rotation      Hip external rotation      Knee flexion 3 3 4 4   Knee extension 5 5    Ankle dorsiflexion 4 4 4+ 4+  Ankle plantarflexion      Ankle inversion      Ankle eversion       (Blank rows = not tested)  FUNCTIONAL TESTS:  03/11/23 30 seconds chair stand test:  8X no UE   2 minute walk test: 226 ft with rolling walker Single leg stance:  Rt: 1" , Lt: 2"   Evaluation:  30 seconds chair stand test:  unable to come sit to stand without UE assist at this time.   2 minute walk test: 168 ft with rolling walker Single leg stance:  unable    TODAY'S TREATMENT:                                                                                                                              DATE: 03/13/23 Sit to stand: No UE assist and pt holds balance for 5 seconds each time x 10 Heel raises x 15 Toe raises x 10 Step up 6" x 10 B Step down 6" B x 10 Functional squat x 10  Single leg stance with one hand hold x 5 B   Sitting: Ankle dorsiflexion x 10  03/11/23 FUNCTIONAL TESTS:  30 seconds chair stand test:  6/19: 8X no UE from standard chair (was unable to come sit to stand without UE assist)  2 minute walk test: 6/19:  226 feet with RW (was 168 ft with rolling walker at evaluation) Single leg stance: 6/19:  Rt: 1", Lt:2" max no HHA (was unable at evaluation)  MMT see above  Goal review see below  02/23/23: Heel raises x 15 Squat x 15 one hand hold only  March x 15 one hand hold only   Step up 4" one hand only x 10 B Step down 4" rt and lt x 10 each Side step with green tband x 3 RT in // Sit to stand x 15  NBS for balance   PATIENT EDUCATION:  Education details: HEP Person educated: Patient and Spouse Education method: Explanation, Actor cues, and Handouts Education comprehension: returned demonstration  HOME EXERCISE PROGRAM: Access Code: [email or web address] URL: [email or web address] Date: 01/22/2023 Prepared by: Virgina Organ Exercises - Supine Bridge  - 1 x daily - 7 x weekly - 1 sets - 10 reps - 3" hold - Active Straight Leg Raise with Quad Set  - 1 x daily - 7 x weekly - 1 sets - 10 reps - 3" hold - Sidelying Hip Abduction  - 1 x daily - 7 x weekly - 1 sets - 10 reps - 3" hold - Seated Ankle Dorsiflexion AROM  - 1 x daily - 7 x weekly - 1 sets - 10 reps - 3" hold - Sit to Stand with Counter Support  - 1 x daily - 7 x weekly - 1 sets - 10 reps  ASSESSMENT:  CLINICAL IMPRESSION: Therapist added standing DF with noted weakness and tightness therefore slant board stretch was added as well.  Pt fatigued following treatment.  Pt would like to be walking without and AD and be able to negotiate her 13 basement stairs safely.  Pt will benefit from continued therapy to focus on improving LE strength and stability to achieve remaining goals.    OBJECTIVE IMPAIRMENTS: cardiopulmonary status limiting activity, decreased activity tolerance, decreased balance, and decreased strength.   ACTIVITY LIMITATIONS: carrying, lifting, standing, squatting, stairs, and locomotion level  PARTICIPATION LIMITATIONS: meal prep, cleaning, laundry, shopping, community activity, and church  PERSONAL FACTORS: Fitness, Past/current experiences, Time since onset of injury/illness/exacerbation, and 3+ comorbidities: CHF, seizure, mengingoma  are also affecting patient's functional outcome.   REHAB POTENTIAL: Fair    CLINICAL DECISION MAKING: Stable/uncomplicated  EVALUATION  COMPLEXITY: Low   GOALS: Goals reviewed with patient? Yes  SHORT TERM GOALS: Target date: 02/12/23 Pt core and LE strength to increase to allow pt to be able to come sit to stand without UE  Baseline: Goal status: MET  2.  PT to be able to walk 226 ft in 2 minutes with least assistive device.  Baseline:  Goal status: MET  3.  Pt to be able to single leg stance for 5 seconds B to reduce risk of falling  Baseline:  Goal status: IN PROGRESS    LONG TERM GOALS: Target date: 03/05/23  Pt core and LE strength to increase to allow pt to be able to go up and down 4 steps in a reciprocal manner with one handrail assist.  Baseline:  Goal status: IN PROGRESS  2.  PT to be able to walk 300 ft in 2 minutes with least assistive device Baseline:  Goal status: IN PROGRESS  3.  Pt to be able to single leg stance for 10 seconds B to reduce risk of falling  Baseline:  Goal status: IN PROGRESS     PLAN:  PT FREQUENCY: 2x/week  PT DURATION: 4 weeks  PLANNED INTERVENTIONS: Therapeutic exercises, Therapeutic activity, Neuromuscular re-education, Balance training, Gait training, Patient/Family education, and Self Care  PLAN FOR NEXT SESSION: work on balance and strengthening to progress toward goals.  Progress to standing exercises next session.  Begin gait with LRAD, balance and stair negotiation.   Virgina Organ, PT CLT [phone]  03/11/2023, 11:31 AM

## 2023-03-13 NOTE — Addendum Note (Signed)
Addended by: Bella Kennedy on: 03/13/2023 09:55 AM   Modules accepted: Orders

## 2023-03-14 DIAGNOSIS — J690 Pneumonitis due to inhalation of food and vomit: Secondary | ICD-10-CM | POA: Diagnosis not present

## 2023-03-14 DIAGNOSIS — J9601 Acute respiratory failure with hypoxia: Secondary | ICD-10-CM | POA: Diagnosis not present

## 2023-03-17 ENCOUNTER — Ambulatory Visit (HOSPITAL_COMMUNITY): Payer: Medicare Other | Admitting: Physical Therapy

## 2023-03-17 DIAGNOSIS — M6281 Muscle weakness (generalized): Secondary | ICD-10-CM | POA: Diagnosis not present

## 2023-03-17 DIAGNOSIS — R262 Difficulty in walking, not elsewhere classified: Secondary | ICD-10-CM | POA: Diagnosis not present

## 2023-03-17 NOTE — Therapy (Signed)
OUTPATIENT PHYSICAL THERAPY TREATMENT     Patient Name: Tara [name] MRN: [id number] DOB:Oct 03, 1945, 77 y.o., female Today's Date: 03/17/2023  END OF SESSION:   PT End of Session - 03/17/23 1430     Visit Number 9    Number of Visits 15    Date for PT Re-Evaluation 04/03/23    Authorization Type [employer] medicare    Progress Note Due on Visit 17    PT Start Time 1350    PT Stop Time 1430    PT Time Calculation (min) 40 min    Equipment Utilized During Treatment Gait belt    Activity Tolerance Patient tolerated treatment well    Behavior During Therapy WFL for tasks assessed/performed                   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   Anxiety    Breast cancer (HCC) 2008   right - s/p lumpectomy->chemo, radiation   Depression    Dysrhythmia    hx SVT   Family history of colon cancer    Family history of prostate cancer    GERD (gastroesophageal reflux disease)    H/O: hysterectomy    Hiatal hernia    History of cancer chemotherapy    History of radiation therapy    Hyperlipidemia    Hypertension 01/25/2018   Nonischemic cardiomyopathy (HCC)    Personal history of radiation therapy 01/05/209   SVT (supraventricular tachycardia)    short RP SVT documented 5/14   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HCC)    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)   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   Past Surgical History:  Procedure Laterality Date   ABDOMINAL HYSTERECTOMY  1994   fibroids,    BIOPSY  06/19/2016   Procedure: BIOPSY;  Surgeon: Corbin Ade, MD;  Location: [facility];  Service: Endoscopy;;  gastric duodenum   BREAST BIOPSY Right 12/16/2006   malignant   BREAST EXCISIONAL BIOPSY Right 2018   benign lumpectomy   BREAST LUMPECTOMY Right 02/2007   BREAST LUMPECTOMY WITH RADIOACTIVE SEED LOCALIZATION Right 07/29/2017    Procedure: RIGHT BREAST LUMPECTOMY WITH RADIOACTIVE SEED LOCALIZATION;  Surgeon: Claud Kelp, MD;  Location: [facility] OR;  Service: General;  Laterality: Right;   BREAST SURGERY Right 2008   lumpectomy, cancer   CARDIAC CATHETERIZATION     CHOLECYSTECTOMY  1999   COLONOSCOPY  2008   Dr. Darrick Penna: multiple polyps. Path not available at time of visit.    COLONOSCOPY WITH PROPOFOL N/A 04/25/2014   Dr. Darrick Penna: Multiple tubular adenomas removed. Diverticulosis. Moderate internal hemorrhoids. Next colonoscopy planned for August 2018.   CRANIOTOMY Bilateral 03/24/2022   Procedure: Bifrontal craniotomy for resection of meningioma;  Surgeon: Coletta Memos, MD;  Location: [facility] OR;  Service: Neurosurgery;  Laterality: Bilateral;   ESOPHAGOGASTRODUODENOSCOPY (EGD) WITH PROPOFOL N/A 06/19/2016   Procedure: ESOPHAGOGASTRODUODENOSCOPY (EGD) WITH PROPOFOL;  Surgeon: Corbin Ade, MD;  Location: [facility];  Service: Endoscopy;  Laterality: N/A;   MASTECTOMY W/ SENTINEL NODE BIOPSY Right 06/09/2018   Procedure: RIGHT TOTAL MASTECTOMY WITH SENTINEL LYMPH NODE BIOPSY;  Surgeon: Claud Kelp, MD;  Location: [facility] OR;  Service: General;  Laterality: Right;   POLYPECTOMY N/A 04/25/2014   Procedure: POLYPECTOMY;  Surgeon: West Bali, MD;  Location: [facility];  Service: Endoscopy;  Laterality: N/A;  Ascending and Decending Colon x3 , Transverse colon x2, rectal   PORTACATH PLACEMENT N/A 06/09/2018   Procedure: INSERTION PORT-A-CATH;  Surgeon: Claud Kelp, MD;  Location: [facility] OR;  Service: General;  Laterality: N/A;   RIGHT/LEFT HEART CATH AND CORONARY ANGIOGRAPHY N/A 07/10/2017   Procedure: RIGHT/LEFT HEART CATH AND CORONARY ANGIOGRAPHY;  Surgeon: Laurey Morale, MD;  Location: [facility] INVASIVE CV LAB;  Service: Cardiovascular;  Laterality: N/A;   Patient Active Problem List   Diagnosis Date Noted   Impaired mobility and ADLs 01/05/2023   IGT (impaired glucose tolerance) 01/05/2023   Non-ischemic cardiomyopathy (HCC)  01/05/2023   Constipation 10/26/2022   Status post insertion of percutaneous endoscopic gastrostomy (PEG) tube (HCC) 09/20/2022   Paroxysmal atrial fibrillation (HCC) 09/20/2022   Chronic anticoagulation 09/20/2022   Hiatal hernia 09/20/2022   Encounter for support and coordination of transition of care 08/23/2022   Aspiration pneumonia (HCC) 08/14/2022   Seizure disorder (HCC)    Lethargy 05/15/2022   Dysfunction of right cardiac ventricle 05/07/2022   Acute postoperative anemia due to expected blood loss 05/07/2022   Dysphagia 05/07/2022   Palliative care encounter    Acute respiratory failure with hypoxia (HCC)    Aspiration into airway    Pressure injury of skin 04/07/2022   Meningioma (HCC) 03/24/2022   Seizure (HCC) 03/15/2022   Atrial fibrillation, chronic (HCC) 03/15/2022   Protein-calorie malnutrition, moderate (HCC) 03/15/2022   Atrial fibrillation with RVR (HCC)    Metabolic acidosis 02/27/2022   Acute renal failure superimposed on stage 3b chronic kidney disease (HCC) 02/27/2022   Anxiety and depression 02/27/2022   Meningioma, cerebral (HCC) 02/27/2022   Normocytic anemia 02/27/2022   Acute renal failure (ARF) (HCC) 02/27/2022   Hemorrhoids 01/04/2021   Ankle deformity, right 09/12/2020   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   Port-A-Cath in place 09/28/2018   Colon adenomas 08/06/2018   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 (HCC) 06/09/2018   Hypertension 01/25/2018   AKI (acute kidney injury) (HCC) 10/25/2016   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   Acute on chronic combined systolic and diastolic CHF (congestive heart failure) (HCC)    Demand ischemia    Hypokalemia 09/29/2016   Prolonged Q-T interval on ECG 09/29/2016   Chronic kidney disease (CKD), stage III (moderate) (HCC) 09/29/2016   Leukocytosis 09/18/2016   Abnormal CT scan, chest    Upper airway cough syndrome  vs Cough variant asthma  04/09/2016   Chronic cough  03/17/2016   Seasonal allergies 08/08/2014   Family history of colon cancer 03/30/2014   Osteoporosis 02/14/2014   Metabolic syndrome X 02/05/2014   SVT (supraventricular tachycardia) 03/14/2013   Chronic systolic CHF (congestive heart failure) (HCC) 03/14/2013   Vitamin D deficiency 07/23/2010   Malignant neoplasm of right breast (HCC) 12/13/2007   Hyperlipidemia 12/13/2007   Anxiety state 12/13/2007   Depression, major, single episode, 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   GERD 12/13/2007    PCP: Syliva Overman  REFERRING PROVIDER: Syliva Overman  REFERRING DIAG:  [id number] (ICD-10-CM) - Impaired mobility and ADLs    THERAPY DIAG:  Muscle weakness  Rationale for Evaluation and Treatment: Rehabilitation  ONSET DATE: 7/23  SUBJECTIVE STATEMENT:  Pt states that she is doing her exercises every day.   PERTINENT HISTORY: CHF, osteoporosis, anxiety,cerebral meningioma, seizures, PEG placement in abdomen PAIN:  Are you having pain? No  PRECAUTIONS: Fall  WEIGHT BEARING RESTRICTIONS: No  FALLS:  Has patient fallen in  last 6 months? No  LIVING ENVIRONMENT: Lives with: lives with their family Lives in: House/apartment Stairs: Yes: Internal: 13 steps; on right going up and External: 3 steps; on right going up Has following equipment at home: Dan Humphreys - 2 wheeled  OCCUPATION: retired  PLOF: INDEPENDENT prior to the seizures.   PATIENT GOALS: to be more Independent; be able to go to basement and do laundry  NEXT MD VISIT: 8/24   OBJECTIVE:   DIAGNOSTIC FINDINGS:  IMPRESSION: 1. 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 2. Diffuse pachymeningeal thickening and enhancement over both cerebral hemispheres, more prominent over the frontal lobes at the midline measuring up to 7 mm in thickness, favored to  reflect evolving postoperative change. Recommend follow-up in 3-6 months to ensure stability.     Electronically Signed   By: Lesia Hausen M.D.   On: 10/17/2022 11:03     COGNITION: Overall cognitive status: Within functional limits for tasks assessed    LOWER EXTREMITY MMT:  MMT Right eval Left eval Right 03/11/23 Left  03/11/23  Hip flexion 5 4 5  4+  Hip extension 3- 3- 3 3  Hip abduction 3 3 3+ 3+  Hip adduction      Hip internal rotation      Hip external rotation      Knee flexion 3 3 4 4   Knee extension 5 5    Ankle dorsiflexion 4 4 4+ 4+  Ankle plantarflexion      Ankle inversion      Ankle eversion       (Blank rows = not tested)  FUNCTIONAL TESTS:  03/11/23 30 seconds chair stand test:  8X no UE   2 minute walk test: 226 ft with rolling walker Single leg stance:  Rt: 1" , Lt: 2"   Evaluation:  30 seconds chair stand test:  unable to come sit to stand without UE assist at this time.   2 minute walk test: 168 ft with rolling walker Single leg stance:  unable    TODAY'S TREATMENT:                                                                                                                              DATE: 03/17/23 Stairs x 3 reps  Sit to stand x 15  Heel raise x 15 Lunge with leg on second step x 10 B Squat x 15 Toe raise x 15  Slant board stretch x 1 min x 2  Toe raise x10 03/13/23 Sit to stand: No UE assist and pt holds balance for 5 seconds each time x 10 Heel raises x 15 Toe raises x 10 Step up 6" x 10 B Step down 6" B x 10 Functional squat x 10  Single leg stance with one hand hold x 5 B   Sitting: Ankle dorsiflexion x  10  03/11/23 FUNCTIONAL TESTS:  30 seconds chair stand test:  6/19: 8X no UE from standard chair (was unable to come sit to stand without UE assist)  2 minute walk test: 6/19:  226 feet with RW (was 168 ft with rolling walker at evaluation) Single leg stance: 6/19:  Rt: 1", Lt:2" max no HHA (was unable at evaluation)   MMT see above  Goal review see below  02/23/23: Heel raises x 15 Squat x 15 one hand hold only  March x 15 one hand hold only  Step up 4" one hand only x 10 B Step down 4" rt and lt x 10 each Side step with green tband x 3 RT in // Sit to stand x 15  NBS for balance   PATIENT EDUCATION:  Education details: HEP Person educated: Patient and Spouse Education method: Explanation, Actor cues, and Handouts Education comprehension: returned demonstration  HOME EXERCISE PROGRAM: Access Code: [email or web address] URL: [email or web address] Date: 01/22/2023 Prepared by: Virgina Organ Exercises - Supine Bridge  - 1 x daily - 7 x weekly - 1 sets - 10 reps - 3" hold - Active Straight Leg Raise with Quad Set  - 1 x daily - 7 x weekly - 1 sets - 10 reps - 3" hold - Sidelying Hip Abduction  - 1 x daily - 7 x weekly - 1 sets - 10 reps - 3" hold - Seated Ankle Dorsiflexion AROM  - 1 x daily - 7 x weekly - 1 sets - 10 reps - 3" hold - Sit to Stand with Counter Support  - 1 x daily - 7 x weekly - 1 sets - 10 reps  ASSESSMENT:  CLINICAL IMPRESSION: Therapist added stair climbing to program.    Pt fatigued following treatment.  Pt would like to be walking without and AD and be able to negotiate her 13 basement stairs safely.  Pt will benefit from continued therapy to focus on improving LE strength and stability to achieve remaining goals.    OBJECTIVE IMPAIRMENTS: cardiopulmonary status limiting activity, decreased activity tolerance, decreased balance, and decreased strength.   ACTIVITY LIMITATIONS: carrying, lifting, standing, squatting, stairs, and locomotion level  PARTICIPATION LIMITATIONS: meal prep, cleaning, laundry, shopping, community activity, and church  PERSONAL FACTORS: Fitness, Past/current experiences, Time since onset of injury/illness/exacerbation, and 3+ comorbidities: CHF, seizure, mengingoma  are also affecting patient's functional outcome.   REHAB POTENTIAL: Fair     CLINICAL DECISION MAKING: Stable/uncomplicated  EVALUATION COMPLEXITY: Low   GOALS: Goals reviewed with patient? Yes  SHORT TERM GOALS: Target date: 02/12/23 Pt core and LE strength to increase to allow pt to be able to come sit to stand without UE  Baseline: Goal status: MET  2.  PT to be able to walk 226 ft in 2 minutes with least assistive device.  Baseline:  Goal status: MET  3.  Pt to be able to single leg stance for 5 seconds B to reduce risk of falling  Baseline:  Goal status: IN PROGRESS    LONG TERM GOALS: Target date: 03/05/23  Pt core and LE strength to increase to allow pt to be able to go up and down 4 steps in a reciprocal manner with one handrail assist.  Baseline:  Goal status: IN PROGRESS  2.  PT to be able to walk 300 ft in 2 minutes with least assistive device Baseline:  Goal status: IN PROGRESS  3.  Pt to be able to single leg stance for 10 seconds B  to reduce risk of falling  Baseline:  Goal status: IN PROGRESS     PLAN:  PT FREQUENCY: 2x/week  PT DURATION: 4 weeks  PLANNED INTERVENTIONS: Therapeutic exercises, Therapeutic activity, Neuromuscular re-education, Balance training, Gait training, Patient/Family education, and Self Care  PLAN FOR NEXT SESSION: work on balance and strengthening to progress toward goals.  Progress to standing exercises next session.  Begin gait with LRAD, balance and stair negotiation.   Virgina Organ, PT CLT [phone]  03/11/2023, 2:34 PM

## 2023-03-19 DIAGNOSIS — H268 Other specified cataract: Secondary | ICD-10-CM | POA: Diagnosis not present

## 2023-03-19 DIAGNOSIS — H25812 Combined forms of age-related cataract, left eye: Secondary | ICD-10-CM | POA: Diagnosis not present

## 2023-03-20 ENCOUNTER — Ambulatory Visit (HOSPITAL_COMMUNITY): Payer: Medicare Other | Admitting: Physical Therapy

## 2023-03-20 DIAGNOSIS — M6281 Muscle weakness (generalized): Secondary | ICD-10-CM

## 2023-03-20 DIAGNOSIS — R262 Difficulty in walking, not elsewhere classified: Secondary | ICD-10-CM | POA: Diagnosis not present

## 2023-03-20 NOTE — Therapy (Signed)
OUTPATIENT PHYSICAL THERAPY TREATMENT     Patient Name: Tara [name] MRN: [id number] DOB:May 28, 1946, 77 y.o., female Today's Date: 03/20/2023  END OF SESSION:   PT End of Session - 03/20/23 1155    Visit Number 10    Number of Visits 15    Date for PT Re-Evaluation 04/03/23    Authorization Type [employer] medicare    Progress Note Due on Visit 17    PT Start Time 1115    PT Stop Time 1155    PT Time Calculation (min) 40 min    Equipment Utilized During Treatment Gait belt    Activity Tolerance Patient tolerated treatment well    Behavior During Therapy WFL for tasks assessed/performed              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   Anxiety    Breast cancer (HCC) 2008   right - s/p lumpectomy->chemo, radiation   Depression    Dysrhythmia    hx SVT   Family history of colon cancer    Family history of prostate cancer    GERD (gastroesophageal reflux disease)    H/O: hysterectomy    Hiatal hernia    History of cancer chemotherapy    History of radiation therapy    Hyperlipidemia    Hypertension 01/25/2018   Nonischemic cardiomyopathy (HCC)    Personal history of radiation therapy 01/05/209   SVT (supraventricular tachycardia)    short RP SVT documented 5/14   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HCC)    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)   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   Past Surgical History:  Procedure Laterality Date   ABDOMINAL HYSTERECTOMY  1994   fibroids,    BIOPSY  06/19/2016   Procedure: BIOPSY;  Surgeon: Corbin Ade, MD;  Location: [facility];  Service: Endoscopy;;  gastric duodenum   BREAST BIOPSY Right 12/16/2006   malignant   BREAST EXCISIONAL BIOPSY Right 2018   benign lumpectomy   BREAST LUMPECTOMY Right 02/2007   BREAST LUMPECTOMY WITH RADIOACTIVE SEED LOCALIZATION Right 07/29/2017   Procedure:  RIGHT BREAST LUMPECTOMY WITH RADIOACTIVE SEED LOCALIZATION;  Surgeon: Claud Kelp, MD;  Location: [facility] OR;  Service: General;  Laterality: Right;   BREAST SURGERY Right 2008   lumpectomy, cancer   CARDIAC CATHETERIZATION     CHOLECYSTECTOMY  1999   COLONOSCOPY  2008   Dr. Darrick Penna: multiple polyps. Path not available at time of visit.    COLONOSCOPY WITH PROPOFOL N/A 04/25/2014   Dr. Darrick Penna: Multiple tubular adenomas removed. Diverticulosis. Moderate internal hemorrhoids. Next colonoscopy planned for August 2018.   CRANIOTOMY Bilateral 03/24/2022   Procedure: Bifrontal craniotomy for resection of meningioma;  Surgeon: Coletta Memos, MD;  Location: [facility] OR;  Service: Neurosurgery;  Laterality: Bilateral;   ESOPHAGOGASTRODUODENOSCOPY (EGD) WITH PROPOFOL N/A 06/19/2016   Procedure: ESOPHAGOGASTRODUODENOSCOPY (EGD) WITH PROPOFOL;  Surgeon: Corbin Ade, MD;  Location: [facility];  Service: Endoscopy;  Laterality: N/A;   MASTECTOMY W/ SENTINEL NODE BIOPSY Right 06/09/2018   Procedure: RIGHT TOTAL MASTECTOMY WITH SENTINEL LYMPH NODE BIOPSY;  Surgeon: Claud Kelp, MD;  Location: [facility] OR;  Service: General;  Laterality: Right;   POLYPECTOMY N/A 04/25/2014   Procedure: POLYPECTOMY;  Surgeon: West Bali, MD;  Location: [facility];  Service: Endoscopy;  Laterality: N/A;  Ascending and Decending Colon x3 , Transverse colon x2, rectal   PORTACATH PLACEMENT N/A 06/09/2018   Procedure: INSERTION PORT-A-CATH;  Surgeon: Claud Kelp, MD;  Location: [facility] OR;  Service: General;  Laterality: N/A;   RIGHT/LEFT HEART CATH AND CORONARY ANGIOGRAPHY N/A 07/10/2017   Procedure: RIGHT/LEFT HEART CATH AND CORONARY ANGIOGRAPHY;  Surgeon: Laurey Morale, MD;  Location: [facility] INVASIVE CV LAB;  Service: Cardiovascular;  Laterality: N/A;   Patient Active Problem List   Diagnosis Date Noted   Impaired mobility and ADLs 01/05/2023   IGT (impaired glucose tolerance) 01/05/2023   Non-ischemic cardiomyopathy (HCC) 01/05/2023    Constipation 10/26/2022   Status post insertion of percutaneous endoscopic gastrostomy (PEG) tube (HCC) 09/20/2022   Paroxysmal atrial fibrillation (HCC) 09/20/2022   Chronic anticoagulation 09/20/2022   Hiatal hernia 09/20/2022   Encounter for support and coordination of transition of care 08/23/2022   Aspiration pneumonia (HCC) 08/14/2022   Seizure disorder (HCC)    Lethargy 05/15/2022   Dysfunction of right cardiac ventricle 05/07/2022   Acute postoperative anemia due to expected blood loss 05/07/2022   Dysphagia 05/07/2022   Palliative care encounter    Acute respiratory failure with hypoxia (HCC)    Aspiration into airway    Pressure injury of skin 04/07/2022   Meningioma (HCC) 03/24/2022   Seizure (HCC) 03/15/2022   Atrial fibrillation, chronic (HCC) 03/15/2022   Protein-calorie malnutrition, moderate (HCC) 03/15/2022   Atrial fibrillation with RVR (HCC)    Metabolic acidosis 02/27/2022   Acute renal failure superimposed on stage 3b chronic kidney disease (HCC) 02/27/2022   Anxiety and depression 02/27/2022   Meningioma, cerebral (HCC) 02/27/2022   Normocytic anemia 02/27/2022   Acute renal failure (ARF) (HCC) 02/27/2022   Hemorrhoids 01/04/2021   Ankle deformity, right 09/12/2020   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   Port-A-Cath in place 09/28/2018   Colon adenomas 08/06/2018   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 (HCC) 06/09/2018   Hypertension 01/25/2018   AKI (acute kidney injury) (HCC) 10/25/2016   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   Acute on chronic combined systolic and diastolic CHF (congestive heart failure) (HCC)    Demand ischemia    Hypokalemia 09/29/2016   Prolonged Q-T interval on ECG 09/29/2016   Chronic kidney disease (CKD), stage III (moderate) (HCC) 09/29/2016   Leukocytosis 09/18/2016   Abnormal CT scan, chest    Upper airway cough syndrome  vs Cough variant asthma  04/09/2016   Chronic cough 03/17/2016    Seasonal allergies 08/08/2014   Family history of colon cancer 03/30/2014   Osteoporosis 02/14/2014   Metabolic syndrome X 02/05/2014   SVT (supraventricular tachycardia) 03/14/2013   Chronic systolic CHF (congestive heart failure) (HCC) 03/14/2013   Vitamin D deficiency 07/23/2010   Malignant neoplasm of right breast (HCC) 12/13/2007   Hyperlipidemia 12/13/2007   Anxiety state 12/13/2007   Depression, major, single episode, 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   GERD 12/13/2007    PCP: Syliva Overman  REFERRING PROVIDER: Syliva Overman  REFERRING DIAG:  [phone] (ICD-10-CM) - Impaired mobility and ADLs    THERAPY DIAG:  Muscle weakness  Rationale for Evaluation and Treatment: Rehabilitation  ONSET DATE: 7/23  SUBJECTIVE STATEMENT:  Pt state has no complaints or questions  PERTINENT HISTORY: CHF, osteoporosis, anxiety,cerebral meningioma, seizures, PEG placement in abdomen PAIN:  Are you having pain? No  PRECAUTIONS: Fall  WEIGHT BEARING RESTRICTIONS: No  FALLS:  Has patient fallen in last 6 months? No  LIVING ENVIRONMENT: Lives with: lives  with their family Lives in: House/apartment Stairs: Yes: Internal: 13 steps; on right going up and External: 3 steps; on right going up Has following equipment at home: Walker - 2 wheeled  OCCUPATION: retired  PLOF: INDEPENDENT prior to the seizures.   PATIENT GOALS: to be more Independent; be able to go to basement and do laundry  NEXT MD VISIT: 8/24   OBJECTIVE:   DIAGNOSTIC FINDINGS:  IMPRESSION: 1. 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 2. Diffuse pachymeningeal thickening and enhancement over both cerebral hemispheres, more prominent over the frontal lobes at the midline measuring up to 7 mm in thickness, favored to reflect evolving postoperative change.  Recommend follow-up in 3-6 months to ensure stability.     Electronically Signed   By: Lesia Hausen M.D.   On: 10/17/2022 11:03     COGNITION: Overall cognitive status: Within functional limits for tasks assessed    LOWER EXTREMITY MMT:  MMT Right eval Left eval Right 03/11/23 Left  03/11/23  Hip flexion 5 4 5  4+  Hip extension 3- 3- 3 3  Hip abduction 3 3 3+ 3+  Hip adduction      Hip internal rotation      Hip external rotation      Knee flexion 3 3 4 4   Knee extension 5 5    Ankle dorsiflexion 4 4 4+ 4+  Ankle plantarflexion      Ankle inversion      Ankle eversion       (Blank rows = not tested)  FUNCTIONAL TESTS:   03/11/23 30 seconds chair stand test:  8X no UE   2 minute walk test: 226 ft with rolling walker Single leg stance:  Rt: 1" , Lt: 2"   Evaluation:  30 seconds chair stand test:  unable to come sit to stand without UE assist at this time.   2 minute walk test: 168 ft with rolling walker Single leg stance:  unable    TODAY'S TREATMENT:                                                                                                                              DATE: 03/20/23: Rocker board x 3 minutes Sit to stand x 15  Heel raise x 15 Lunge with leg on second step x 10 B Squat x 15 Toe raise x 15  Slant board stretch x 1 min x 2  Toe raise x 10 Side step with green theraband x 2 Rt in // Retro gait in // Sit to stand no UE assist 03/17/23 Stairs x 3 reps  Sit to stand x 15  Heel raise x 15 Lunge with leg on second step x 10 B Squat x 15 Toe raise x 15  Slant board stretch x 1 min x 2  Toe raise x10 03/13/23 Sit  to stand: No UE assist and pt holds balance for 5 seconds each time x 10 Heel raises x 15 Toe raises x 10 Step up 6" x 10 B Step down 6" B x 10 Functional squat x 10  Single leg stance with one hand hold x 5 B   Sitting: Ankle dorsiflexion x 10  03/11/23 FUNCTIONAL TESTS:  30 seconds chair stand test:  6/19: 8X no UE from  standard chair (was unable to come sit to stand without UE assist)  2 minute walk test: 6/19:  226 feet with RW (was 168 ft with rolling walker at evaluation) Single leg stance: 6/19:  Rt: 1", Lt:2" max no HHA (was unable at evaluation)  MMT see above  Goal review see below  02/23/23: Heel raises x 15 Squat x 15 one hand hold only  March x 15 one hand hold only  Step up 4" one hand only x 10 B Step down 4" rt and lt x 10 each Side step with green tband x 3 RT in // Sit to stand x 15  NBS for balance   PATIENT EDUCATION:  Education details: HEP Person educated: Patient and Spouse Education method: Explanation, Actor cues, and Handouts Education comprehension: returned demonstration  HOME EXERCISE PROGRAM: Access Code: [email or web address] URL: [email or web address] Date: 01/22/2023 Prepared by: Virgina Organ Exercises - Supine Bridge  - 1 x daily - 7 x weekly - 1 sets - 10 reps - 3" hold - Active Straight Leg Raise with Quad Set  - 1 x daily - 7 x weekly - 1 sets - 10 reps - 3" hold - Sidelying Hip Abduction  - 1 x daily - 7 x weekly - 1 sets - 10 reps - 3" hold - Seated Ankle Dorsiflexion AROM  - 1 x daily - 7 x weekly - 1 sets - 10 reps - 3" hold - Sit to Stand with Counter Support  - 1 x daily - 7 x weekly - 1 sets - 10 reps  ASSESSMENT:  CLINICAL IMPRESSION: Treatment focused on both strengthening as well as balance.  Pt continues to have decreased balance, strength and activity tolerance and  will benefit from continued therapy to focus on improving LE strength and stability to achieve remaining goals.    OBJECTIVE IMPAIRMENTS: cardiopulmonary status limiting activity, decreased activity tolerance, decreased balance, and decreased strength.   ACTIVITY LIMITATIONS: carrying, lifting, standing, squatting, stairs, and locomotion level  PARTICIPATION LIMITATIONS: meal prep, cleaning, laundry, shopping, community activity, and church  PERSONAL FACTORS: Fitness,  Past/current experiences, Time since onset of injury/illness/exacerbation, and 3+ comorbidities: CHF, seizure, mengingoma  are also affecting patient's functional outcome.   REHAB POTENTIAL: Fair    CLINICAL DECISION MAKING: Stable/uncomplicated  EVALUATION COMPLEXITY: Low   GOALS: Goals reviewed with patient? Yes  SHORT TERM GOALS: Target date: 02/12/23 Pt core and LE strength to increase to allow pt to be able to come sit to stand without UE  Baseline: Goal status: MET  2.  PT to be able to walk 226 ft in 2 minutes with least assistive device.  Baseline:  Goal status: MET  3.  Pt to be able to single leg stance for 5 seconds B to reduce risk of falling  Baseline:  Goal status: IN PROGRESS    LONG TERM GOALS: Target date: 03/05/23  Pt core and LE strength to increase to allow pt to be able to go up and down 4 steps in a reciprocal manner with one handrail assist.  Baseline:  Goal status: IN PROGRESS  2.  PT to be able to walk 300 ft in 2 minutes with least assistive device Baseline:  Goal status: IN PROGRESS  3.  Pt to be able to single leg stance for 10 seconds B to reduce risk of falling  Baseline:  Goal status: IN PROGRESS     PLAN:  PT FREQUENCY: 2x/week  PT DURATION: 4 weeks  PLANNED INTERVENTIONS: Therapeutic exercises, Therapeutic activity, Neuromuscular re-education, Balance training, Gait training, Patient/Family education, and Self Care  PLAN FOR NEXT SESSION: work on balance and strengthening to progress toward goals.  Progress to standing exercises next session.  Begin gait with LRAD, balance and stair negotiation.   Virgina Organ, PT CLT [phone]  03/11/2023, 12:00 PM

## 2023-03-23 ENCOUNTER — Ambulatory Visit (HOSPITAL_COMMUNITY): Payer: Medicare Other | Attending: Family Medicine | Admitting: Physical Therapy

## 2023-03-23 DIAGNOSIS — M6281 Muscle weakness (generalized): Secondary | ICD-10-CM | POA: Insufficient documentation

## 2023-03-23 DIAGNOSIS — R262 Difficulty in walking, not elsewhere classified: Secondary | ICD-10-CM | POA: Insufficient documentation

## 2023-03-23 NOTE — Therapy (Signed)
OUTPATIENT PHYSICAL THERAPY TREATMENT     Patient Name: Tara [name] MRN: [id number] DOB:11/27/1945, 77 y.o., female Today's Date: 03/23/2023  END OF SESSION:   PT End of Session - 03/23/23 1424     Visit Number 11    Number of Visits 15    Date for PT Re-Evaluation 04/03/23    Authorization Type [employer] medicare    Progress Note Due on Visit 17    PT Start Time 1341    PT Stop Time 1424    PT Time Calculation (min) 43 min    Equipment Utilized During Treatment Gait belt    Activity Tolerance Patient tolerated treatment well    Behavior During Therapy WFL for tasks assessed/performed                  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   Anxiety    Breast cancer (HCC) 2008   right - s/p lumpectomy->chemo, radiation   Depression    Dysrhythmia    hx SVT   Family history of colon cancer    Family history of prostate cancer    GERD (gastroesophageal reflux disease)    H/O: hysterectomy    Hiatal hernia    History of cancer chemotherapy    History of radiation therapy    Hyperlipidemia    Hypertension 01/25/2018   Nonischemic cardiomyopathy (HCC)    Personal history of radiation therapy 01/05/209   SVT (supraventricular tachycardia)    short RP SVT documented 5/14   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HCC)    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)   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   Past Surgical History:  Procedure Laterality Date   ABDOMINAL HYSTERECTOMY  1994   fibroids,    BIOPSY  06/19/2016   Procedure: BIOPSY;  Surgeon: Corbin Ade, MD;  Location: [facility];  Service: Endoscopy;;  gastric duodenum   BREAST BIOPSY Right 12/16/2006   malignant   BREAST EXCISIONAL BIOPSY Right 2018   benign lumpectomy   BREAST LUMPECTOMY Right 02/2007   BREAST LUMPECTOMY WITH RADIOACTIVE SEED LOCALIZATION Right 07/29/2017    Procedure: RIGHT BREAST LUMPECTOMY WITH RADIOACTIVE SEED LOCALIZATION;  Surgeon: Claud Kelp, MD;  Location: [facility] OR;  Service: General;  Laterality: Right;   BREAST SURGERY Right 2008   lumpectomy, cancer   CARDIAC CATHETERIZATION     CHOLECYSTECTOMY  1999   COLONOSCOPY  2008   Dr. Darrick Penna: multiple polyps. Path not available at time of visit.    COLONOSCOPY WITH PROPOFOL N/A 04/25/2014   Dr. Darrick Penna: Multiple tubular adenomas removed. Diverticulosis. Moderate internal hemorrhoids. Next colonoscopy planned for August 2018.   CRANIOTOMY Bilateral 03/24/2022   Procedure: Bifrontal craniotomy for resection of meningioma;  Surgeon: Coletta Memos, MD;  Location: [facility] OR;  Service: Neurosurgery;  Laterality: Bilateral;   ESOPHAGOGASTRODUODENOSCOPY (EGD) WITH PROPOFOL N/A 06/19/2016   Procedure: ESOPHAGOGASTRODUODENOSCOPY (EGD) WITH PROPOFOL;  Surgeon: Corbin Ade, MD;  Location: [facility];  Service: Endoscopy;  Laterality: N/A;   MASTECTOMY W/ SENTINEL NODE BIOPSY Right 06/09/2018   Procedure: RIGHT TOTAL MASTECTOMY WITH SENTINEL LYMPH NODE BIOPSY;  Surgeon: Claud Kelp, MD;  Location: [facility] OR;  Service: General;  Laterality: Right;   POLYPECTOMY N/A 04/25/2014   Procedure: POLYPECTOMY;  Surgeon: West Bali, MD;  Location: [facility];  Service:  Endoscopy;  Laterality: N/A;  Ascending and Decending Colon x3 , Transverse colon x2, rectal   PORTACATH PLACEMENT N/A 06/09/2018   Procedure: INSERTION PORT-A-CATH;  Surgeon: Claud Kelp, MD;  [facility];  Service: General;  Laterality: N/A;   RIGHT/LEFT HEART CATH AND CORONARY ANGIOGRAPHY N/A 07/10/2017   Procedure: RIGHT/LEFT HEART CATH AND CORONARY ANGIOGRAPHY;  Surgeon: Laurey Morale, MD;  Location: [facility] INVASIVE CV LAB;  Service: Cardiovascular;  Laterality: N/A;   Patient Active Problem List   Diagnosis Date Noted   Impaired mobility and ADLs 01/05/2023   IGT (impaired glucose tolerance) 01/05/2023   Non-ischemic cardiomyopathy (HCC)  01/05/2023   Constipation 10/26/2022   Status post insertion of percutaneous endoscopic gastrostomy (PEG) tube (HCC) 09/20/2022   Paroxysmal atrial fibrillation (HCC) 09/20/2022   Chronic anticoagulation 09/20/2022   Hiatal hernia 09/20/2022   Encounter for support and coordination of transition of care 08/23/2022   Aspiration pneumonia (HCC) 08/14/2022   Seizure disorder (HCC)    Lethargy 05/15/2022   Dysfunction of right cardiac ventricle 05/07/2022   Acute postoperative anemia due to expected blood loss 05/07/2022   Dysphagia 05/07/2022   Palliative care encounter    Acute respiratory failure with hypoxia (HCC)    Aspiration into airway    Pressure injury of skin 04/07/2022   Meningioma (HCC) 03/24/2022   Seizure (HCC) 03/15/2022   Atrial fibrillation, chronic (HCC) 03/15/2022   Protein-calorie malnutrition, moderate (HCC) 03/15/2022   Atrial fibrillation with RVR (HCC)    Metabolic acidosis 02/27/2022   Acute renal failure superimposed on stage 3b chronic kidney disease (HCC) 02/27/2022   Anxiety and depression 02/27/2022   Meningioma, cerebral (HCC) 02/27/2022   Normocytic anemia 02/27/2022   Acute renal failure (ARF) (HCC) 02/27/2022   Hemorrhoids 01/04/2021   Ankle deformity, right 09/12/2020   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   Port-A-Cath in place 09/28/2018   Colon adenomas 08/06/2018   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 (HCC) 06/09/2018   Hypertension 01/25/2018   AKI (acute kidney injury) (HCC) 10/25/2016   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   Acute on chronic combined systolic and diastolic CHF (congestive heart failure) (HCC)    Demand ischemia    Hypokalemia 09/29/2016   Prolonged Q-T interval on ECG 09/29/2016   Chronic kidney disease (CKD), stage III (moderate) (HCC) 09/29/2016   Leukocytosis 09/18/2016   Abnormal CT scan, chest    Upper airway cough syndrome  vs Cough variant asthma  04/09/2016   Chronic cough  03/17/2016   Seasonal allergies 08/08/2014   Family history of colon cancer 03/30/2014   Osteoporosis 02/14/2014   Metabolic syndrome X 02/05/2014   SVT (supraventricular tachycardia) 03/14/2013   Chronic systolic CHF (congestive heart failure) (HCC) 03/14/2013   Vitamin D deficiency 07/23/2010   Malignant neoplasm of right breast (HCC) 12/13/2007   Hyperlipidemia 12/13/2007   Anxiety state 12/13/2007   Depression, major, single episode, 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   GERD 12/13/2007    PCP: Syliva Overman  REFERRING PROVIDER: Syliva Overman  REFERRING DIAG:  [phone] (ICD-10-CM) - Impaired mobility and ADLs    THERAPY DIAG:  Muscle weakness  Rationale for Evaluation and Treatment: Rehabilitation  ONSET DATE: 7/23  SUBJECTIVE STATEMENT:  Pt state has no complaints or questions  PERTINENT HISTORY: CHF, osteoporosis, anxiety,cerebral meningioma, seizures, PEG placement in abdomen PAIN:  Are you having pain? No  PRECAUTIONS: Fall  WEIGHT BEARING RESTRICTIONS: No  FALLS:  Has patient fallen in last 6 months? No  LIVING ENVIRONMENT: Lives with: lives with their family Lives in: House/apartment Stairs: Yes: Internal: 13 steps; on right going up and External: 3 steps; on right going up Has following equipment at home: Dan Humphreys - 2 wheeled  OCCUPATION: retired  PLOF: INDEPENDENT prior to the seizures.   PATIENT GOALS: to be more Independent; be able to go to basement and do laundry  NEXT MD VISIT: 8/24   OBJECTIVE:   DIAGNOSTIC FINDINGS:  IMPRESSION: 1. 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 2. Diffuse pachymeningeal thickening and enhancement over both cerebral hemispheres, more prominent over the frontal lobes at the midline measuring up to 7 mm in thickness, favored to reflect evolving  postoperative change. Recommend follow-up in 3-6 months to ensure stability.     Electronically Signed   By: Lesia Hausen M.D.   On: 10/17/2022 11:03     COGNITION: Overall cognitive status: Within functional limits for tasks assessed    LOWER EXTREMITY MMT:  MMT Right eval Left eval Right 03/11/23 Left  03/11/23  Hip flexion 5 4 5  4+  Hip extension 3- 3- 3 3  Hip abduction 3 3 3+ 3+  Hip adduction      Hip internal rotation      Hip external rotation      Knee flexion 3 3 4 4   Knee extension 5 5    Ankle dorsiflexion 4 4 4+ 4+  Ankle plantarflexion      Ankle inversion      Ankle eversion       (Blank rows = not tested)  FUNCTIONAL TESTS:   03/11/23 30 seconds chair stand test:  8X no UE   2 minute walk test: 226 ft with rolling walker Single leg stance:  Rt: 1" , Lt: 2"   Evaluation:  30 seconds chair stand test:  unable to come sit to stand without UE assist at this time.   2 minute walk test: 168 ft with rolling walker Single leg stance:  unable    TODAY'S TREATMENT:                                                                                                                              DATE: 03/23/23:  Gait x 2 minutes with RW Steps with hand rail assist x 12 steps   Squat x 15 Toe raise x 15  Slant board stretch x 1 min x 2  Toe raise x 15 Side step with green theraband x 2 Rt in // Retro gait in // Sit to stand no UE assist Marching x10 03/20/23: Rocker board x 3 minutes Sit to stand x 15  Heel raise x 15 Lunge with leg on second step x 10 B Squat x 15 Toe raise x 15  Slant board stretch x 1 min x 2  Toe raise x 10 Side  step with green theraband x 2 Rt in // Retro gait in // Sit to stand no UE assist 03/17/23 Stairs x 3 reps  Sit to stand x 15  Heel raise x 15 Lunge with leg on second step x 10 B Squat x 15 Toe raise x 15  Slant board stretch x 1 min x 2  Toe raise x10 03/13/23 Sit to stand: No UE assist and pt holds balance for 5  seconds each time x 10 Heel raises x 15 Toe raises x 10 Step up 6" x 10 B Step down 6" B x 10 Functional squat x 10  Single leg stance with one hand hold x 5 B   Sitting: Ankle dorsiflexion x 10  03/11/23 FUNCTIONAL TESTS:  30 seconds chair stand test:  6/19: 8X no UE from standard chair (was unable to come sit to stand without UE assist)  2 minute walk test: 6/19:  226 feet with RW (was 168 ft with rolling walker at evaluation) Single leg stance: 6/19:  Rt: 1", Lt:2" max no HHA (was unable at evaluation)  MMT see above  Goal review see below  02/23/23: Heel raises x 15 Squat x 15 one hand hold only  March x 15 one hand hold only  Step up 4" one hand only x 10 B Step down 4" rt and lt x 10 each Side step with green tband x 3 RT in // Sit to stand x 15  NBS for balance   PATIENT EDUCATION:  Education details: HEP Person educated: Patient and Spouse Education method: Explanation, Actor cues, and Handouts Education comprehension: returned demonstration  HOME EXERCISE PROGRAM: Access Code: [email or web address] URL: [email or web address] Date: 01/22/2023 Prepared by: Virgina Organ Exercises - Supine Bridge  - 1 x daily - 7 x weekly - 1 sets - 10 reps - 3" hold - Active Straight Leg Raise with Quad Set  - 1 x daily - 7 x weekly - 1 sets - 10 reps - 3" hold - Sidelying Hip Abduction  - 1 x daily - 7 x weekly - 1 sets - 10 reps - 3" hold - Seated Ankle Dorsiflexion AROM  - 1 x daily - 7 x weekly - 1 sets - 10 reps - 3" hold  03/23/23: - Sit to Stand with Counter Support  - 1 x daily - 7 x weekly - 1 sets - 10 reps - Standing Heel Raises  - 1 x daily - 7 x weekly - 1 sets - 15 reps - 5' hold - Mini Squat with Counter Support  - 1 x daily - 7 x weekly - 1 sets - 15 reps - Side Stepping with Unilateral Counter Support  - 1 x daily - 7 x weekly - 1 sets - 10 reps - Standing Romberg to 3/4 Tandem Stance  - 1 x daily - 7 x weekly - 1 sets - 10 reps  ASSESSMENT:  CLINICAL  IMPRESSION: Treatment  continues to focus on balance.  Pt continues to have decreased balance, strength and activity tolerance and  will benefit from continued therapy to focus on improving LE strength and stability to achieve remaining goals.    OBJECTIVE IMPAIRMENTS: cardiopulmonary status limiting activity, decreased activity tolerance, decreased balance, and decreased strength.   ACTIVITY LIMITATIONS: carrying, lifting, standing, squatting, stairs, and locomotion level  PARTICIPATION LIMITATIONS: meal prep, cleaning, laundry, shopping, community activity, and church  PERSONAL FACTORS: Fitness, Past/current experiences, Time since onset of injury/illness/exacerbation, and 3+ comorbidities: CHF, seizure, mengingoma  are also affecting patient's functional outcome.   REHAB POTENTIAL: Fair    CLINICAL DECISION MAKING: Stable/uncomplicated  EVALUATION COMPLEXITY: Low   GOALS: Goals reviewed with patient? Yes  SHORT TERM GOALS: Target date: 02/12/23 Pt core and LE strength to increase to allow pt to be able to come sit to stand without UE  Baseline: Goal status: MET  2.  PT to be able to walk 226 ft in 2 minutes with least assistive device.  Baseline:  Goal status: MET  3.  Pt to be able to single leg stance for 5 seconds B to reduce risk of falling  Baseline:  Goal status: IN PROGRESS    LONG TERM GOALS: Target date: 03/05/23  Pt core and LE strength to increase to allow pt to be able to go up and down 4 steps in a reciprocal manner with one handrail assist.  Baseline:  Goal status:able to do with supervision   2.  PT to be able to walk 300 ft in 2 minutes with least assistive device Baseline:  Goal status: MET  3.  Pt to be able to single leg stance for 10 seconds B to reduce risk of falling  Baseline:  Goal status: IN PROGRESS     PLAN:  PT FREQUENCY: 2x/week  PT DURATION: 4 weeks do continue for 2 extra weeks    PLANNED INTERVENTIONS: Therapeutic exercises,  Therapeutic activity, Neuromuscular re-education, Balance training, Gait training, Patient/Family education, and Self Care  PLAN FOR NEXT SESSION: work on balance and strengthening to progress toward goals.  Progress to standing exercises next session.  Begin gait with LRAD, balance and stair negotiation.   Virgina Organ, PT CLT [phone]  03/11/2023, 2:26 PM

## 2023-03-25 ENCOUNTER — Ambulatory Visit (HOSPITAL_COMMUNITY): Payer: Medicare Other | Admitting: Physical Therapy

## 2023-03-25 DIAGNOSIS — R262 Difficulty in walking, not elsewhere classified: Secondary | ICD-10-CM

## 2023-03-25 DIAGNOSIS — M6281 Muscle weakness (generalized): Secondary | ICD-10-CM | POA: Diagnosis not present

## 2023-03-25 NOTE — Therapy (Signed)
OUTPATIENT PHYSICAL THERAPY TREATMENT     Patient Name: Tara [name] MRN: [id number] DOB:1945/09/26, 77 y.o., female Today's Date: 03/25/2023  END OF SESSION:   PT End of Session - 03/25/23 1611     Visit Number 12    Number of Visits 15    Date for PT Re-Evaluation 04/03/23    Authorization Type [employer] medicare    Progress Note Due on Visit 17    PT Start Time 1445    PT Stop Time 1530    PT Time Calculation (min) 45 min    Equipment Utilized During Treatment Gait belt    Activity Tolerance Patient tolerated treatment well    Behavior During Therapy WFL for tasks assessed/performed              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   Anxiety    Breast cancer (HCC) 2008   right - s/p lumpectomy->chemo, radiation   Depression    Dysrhythmia    hx SVT   Family history of colon cancer    Family history of prostate cancer    GERD (gastroesophageal reflux disease)    H/O: hysterectomy    Hiatal hernia    History of cancer chemotherapy    History of radiation therapy    Hyperlipidemia    Hypertension 01/25/2018   Nonischemic cardiomyopathy (HCC)    Personal history of radiation therapy 01/05/209   SVT (supraventricular tachycardia)    short RP SVT documented 5/14   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HCC)    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)   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   Past Surgical History:  Procedure Laterality Date   ABDOMINAL HYSTERECTOMY  1994   fibroids,    BIOPSY  06/19/2016   Procedure: BIOPSY;  Surgeon: Corbin Ade, MD;  Location: [facility];  Service: Endoscopy;;  gastric duodenum   BREAST BIOPSY Right 12/16/2006   malignant   BREAST EXCISIONAL BIOPSY Right 2018   benign lumpectomy   BREAST LUMPECTOMY Right 02/2007   BREAST LUMPECTOMY WITH RADIOACTIVE SEED LOCALIZATION Right 07/29/2017   Procedure:  RIGHT BREAST LUMPECTOMY WITH RADIOACTIVE SEED LOCALIZATION;  Surgeon: Claud Kelp, MD;  Location: [facility] OR;  Service: General;  Laterality: Right;   BREAST SURGERY Right 2008   lumpectomy, cancer   CARDIAC CATHETERIZATION     CHOLECYSTECTOMY  1999   COLONOSCOPY  2008   Dr. Darrick Penna: multiple polyps. Path not available at time of visit.    COLONOSCOPY WITH PROPOFOL N/A 04/25/2014   Dr. Darrick Penna: Multiple tubular adenomas removed. Diverticulosis. Moderate internal hemorrhoids. Next colonoscopy planned for August 2018.   CRANIOTOMY Bilateral 03/24/2022   Procedure: Bifrontal craniotomy for resection of meningioma;  Surgeon: Coletta Memos, MD;  Location: [facility] OR;  Service: Neurosurgery;  Laterality: Bilateral;   ESOPHAGOGASTRODUODENOSCOPY (EGD) WITH PROPOFOL N/A 06/19/2016   Procedure: ESOPHAGOGASTRODUODENOSCOPY (EGD) WITH PROPOFOL;  Surgeon: Corbin Ade, MD;  Location: [facility];  Service: Endoscopy;  Laterality: N/A;   MASTECTOMY W/ SENTINEL NODE BIOPSY Right 06/09/2018   Procedure: RIGHT TOTAL MASTECTOMY WITH SENTINEL LYMPH NODE BIOPSY;  Surgeon: Claud Kelp, MD;  Location: [facility] OR;  Service: General;  Laterality: Right;   POLYPECTOMY N/A 04/25/2014   Procedure: POLYPECTOMY;  Surgeon: West Bali, MD;  Location: [facility];  Service: Endoscopy;  Laterality: N/A;  Ascending and Decending Colon x3 , Transverse colon x2, rectal   PORTACATH PLACEMENT N/A 06/09/2018   Procedure: INSERTION PORT-A-CATH;  Surgeon: Claud Kelp, MD;  Location: [facility] OR;  Service: General;  Laterality: N/A;   RIGHT/LEFT HEART CATH AND CORONARY ANGIOGRAPHY N/A 07/10/2017   Procedure: RIGHT/LEFT HEART CATH AND CORONARY ANGIOGRAPHY;  Surgeon: Laurey Morale, MD;  Location: [facility] INVASIVE CV LAB;  Service: Cardiovascular;  Laterality: N/A;   Patient Active Problem List   Diagnosis Date Noted   Impaired mobility and ADLs 01/05/2023   IGT (impaired glucose tolerance) 01/05/2023   Non-ischemic cardiomyopathy (HCC) 01/05/2023    Constipation 10/26/2022   Status post insertion of percutaneous endoscopic gastrostomy (PEG) tube (HCC) 09/20/2022   Paroxysmal atrial fibrillation (HCC) 09/20/2022   Chronic anticoagulation 09/20/2022   Hiatal hernia 09/20/2022   Encounter for support and coordination of transition of care 08/23/2022   Aspiration pneumonia (HCC) 08/14/2022   Seizure disorder (HCC)    Lethargy 05/15/2022   Dysfunction of right cardiac ventricle 05/07/2022   Acute postoperative anemia due to expected blood loss 05/07/2022   Dysphagia 05/07/2022   Palliative care encounter    Acute respiratory failure with hypoxia (HCC)    Aspiration into airway    Pressure injury of skin 04/07/2022   Meningioma (HCC) 03/24/2022   Seizure (HCC) 03/15/2022   Atrial fibrillation, chronic (HCC) 03/15/2022   Protein-calorie malnutrition, moderate (HCC) 03/15/2022   Atrial fibrillation with RVR (HCC)    Metabolic acidosis 02/27/2022   Acute renal failure superimposed on stage 3b chronic kidney disease (HCC) 02/27/2022   Anxiety and depression 02/27/2022   Meningioma, cerebral (HCC) 02/27/2022   Normocytic anemia 02/27/2022   Acute renal failure (ARF) (HCC) 02/27/2022   Hemorrhoids 01/04/2021   Ankle deformity, right 09/12/2020   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   Port-A-Cath in place 09/28/2018   Colon adenomas 08/06/2018   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 (HCC) 06/09/2018   Hypertension 01/25/2018   AKI (acute kidney injury) (HCC) 10/25/2016   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   Acute on chronic combined systolic and diastolic CHF (congestive heart failure) (HCC)    Demand ischemia    Hypokalemia 09/29/2016   Prolonged Q-T interval on ECG 09/29/2016   Chronic kidney disease (CKD), stage III (moderate) (HCC) 09/29/2016   Leukocytosis 09/18/2016   Abnormal CT scan, chest    Upper airway cough syndrome  vs Cough variant asthma  04/09/2016   Chronic cough 03/17/2016    Seasonal allergies 08/08/2014   Family history of colon cancer 03/30/2014   Osteoporosis 02/14/2014   Metabolic syndrome X 02/05/2014   SVT (supraventricular tachycardia) 03/14/2013   Chronic systolic CHF (congestive heart failure) (HCC) 03/14/2013   Vitamin D deficiency 07/23/2010   Malignant neoplasm of right breast (HCC) 12/13/2007   Hyperlipidemia 12/13/2007   Anxiety state 12/13/2007   Depression, major, single episode, 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   GERD 12/13/2007    PCP: Syliva Overman  REFERRING PROVIDER: Syliva Overman  REFERRING DIAG:  [phone] (ICD-10-CM) - Impaired mobility and ADLs    THERAPY DIAG:  Muscle weakness  Rationale for Evaluation and Treatment: Rehabilitation  ONSET DATE: 7/23  SUBJECTIVE STATEMENT:  Pt states she has family in town and going to dinner after therapy today.  States she is ready to start using a cane.   PERTINENT HISTORY: CHF, osteoporosis, anxiety,cerebral meningioma, seizures, PEG placement in abdomen PAIN:  Are you having pain? No  PRECAUTIONS: Fall  WEIGHT BEARING RESTRICTIONS:  No  FALLS:  Has patient fallen in last 6 months? No  LIVING ENVIRONMENT: Lives with: lives with their family Lives in: House/apartment Stairs: Yes: Internal: 13 steps; on right going up and External: 3 steps; on right going up Has following equipment at home: Dan Humphreys - 2 wheeled  OCCUPATION: retired  PLOF: INDEPENDENT prior to the seizures.   PATIENT GOALS: to be more Independent; be able to go to basement and do laundry  NEXT MD VISIT: 8/24   OBJECTIVE:   DIAGNOSTIC FINDINGS:  IMPRESSION: 1. 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 2. Diffuse pachymeningeal thickening and enhancement over both cerebral hemispheres, more prominent over the frontal lobes at the midline  measuring up to 7 mm in thickness, favored to reflect evolving postoperative change. Recommend follow-up in 3-6 months to ensure stability.     Electronically Signed   By: Lesia Hausen M.D.   On: 10/17/2022 11:03     COGNITION: Overall cognitive status: Within functional limits for tasks assessed    LOWER EXTREMITY MMT:  MMT Right eval Left eval Right 03/11/23 Left  03/11/23  Hip flexion 5 4 5  4+  Hip extension 3- 3- 3 3  Hip abduction 3 3 3+ 3+  Hip adduction      Hip internal rotation      Hip external rotation      Knee flexion 3 3 4 4   Knee extension 5 5    Ankle dorsiflexion 4 4 4+ 4+  Ankle plantarflexion      Ankle inversion      Ankle eversion       (Blank rows = not tested)  FUNCTIONAL TESTS:   03/11/23 30 seconds chair stand test:  8X no UE   2 minute walk test: 226 ft with rolling walker Single leg stance:  Rt: 1" , Lt: 2"   Evaluation:  30 seconds chair stand test:  unable to come sit to stand without UE assist at this time.   2 minute walk test: 168 ft with rolling walker Single leg stance:  unable    TODAY'S TREATMENT:                                                                                                                              DATE: 03/25/23 Heelraises 20X on incline Forward amb in // with Rt UE only and retro return 3RT Side stepping in // with Rt UE only 2RT Slant board stretch 3X30" 4" lunges no UE assist 10X2 each with 1 HHA 4" forward step ups with 1 HHA 2X10 each 4" lateral step ups with 1 HHA 2X10 each Gait with SPC 100 feet  03/23/23:  Gait x 2 minutes with RW Steps with hand rail assist x 12 steps   Squat x 15 Toe raise x 15  Slant board stretch x 1  min x 2  Toe raise x 15 Side step with green theraband x 2 Rt in // Retro gait in // Sit to stand no UE assist Marching x10 03/20/23: Rocker board x 3 minutes Sit to stand x 15  Heel raise x 15 Lunge with leg on second step x 10 B Squat x 15 Toe raise x 15  Slant  board stretch x 1 min x 2  Toe raise x 10 Side step with green theraband x 2 Rt in // Retro gait in // Sit to stand no UE assist 03/17/23 Stairs x 3 reps  Sit to stand x 15  Heel raise x 15 Lunge with leg on second step x 10 B Squat x 15 Toe raise x 15  Slant board stretch x 1 min x 2  Toe raise x10 03/13/23 Sit to stand: No UE assist and pt holds balance for 5 seconds each time x 10 Heel raises x 15 Toe raises x 10 Step up 6" x 10 B Step down 6" B x 10 Functional squat x 10  Single leg stance with one hand hold x 5 B   Sitting: Ankle dorsiflexion x 10  03/11/23 FUNCTIONAL TESTS:  30 seconds chair stand test:  6/19: 8X no UE from standard chair (was unable to come sit to stand without UE assist)  2 minute walk test: 6/19:  226 feet with RW (was 168 ft with rolling walker at evaluation) Single leg stance: 6/19:  Rt: 1", Lt:2" max no HHA (was unable at evaluation)  MMT see above  Goal review see below  02/23/23: Heel raises x 15 Squat x 15 one hand hold only  March x 15 one hand hold only  Step up 4" one hand only x 10 B Step down 4" rt and lt x 10 each Side step with green tband x 3 RT in // Sit to stand x 15  NBS for balance   PATIENT EDUCATION:  Education details: HEP Person educated: Patient and Spouse Education method: Explanation, Actor cues, and Handouts Education comprehension: returned demonstration  HOME EXERCISE PROGRAM: Access Code: [email or web address] URL: [email or web address] Date: 01/22/2023 Prepared by: Virgina Organ Exercises - Supine Bridge  - 1 x daily - 7 x weekly - 1 sets - 10 reps - 3" hold - Active Straight Leg Raise with Quad Set  - 1 x daily - 7 x weekly - 1 sets - 10 reps - 3" hold - Sidelying Hip Abduction  - 1 x daily - 7 x weekly - 1 sets - 10 reps - 3" hold - Seated Ankle Dorsiflexion AROM  - 1 x daily - 7 x weekly - 1 sets - 10 reps - 3" hold  03/23/23: - Sit to Stand with Counter Support  - 1 x daily - 7 x weekly - 1 sets -  10 reps - Standing Heel Raises  - 1 x daily - 7 x weekly - 1 sets - 15 reps - 5' hold - Mini Squat with Counter Support  - 1 x daily - 7 x weekly - 1 sets - 15 reps - Side Stepping with Unilateral Counter Support  - 1 x daily - 7 x weekly - 1 sets - 10 reps - Standing Romberg to 3/4 Tandem Stance  - 1 x daily - 7 x weekly - 1 sets - 10 reps  ASSESSMENT:  CLINICAL IMPRESSION:  Continued with standing functional strengthening.  Progressed to reduced use of UE but with CGA from therapist.  Pt had one episode where she lost her balance requiring min assist. Began gait training with SPC with overall good stability and sequencing.  CGA from therapist for safety while using SPC.  Pt only required 2 seated rest breaks during session today.  Repeated step ups completed to work on LE strength. Pt will benefit from continued therapy to focus on improving LE strength and stability to achieve remaining goals.    OBJECTIVE IMPAIRMENTS: cardiopulmonary status limiting activity, decreased activity tolerance, decreased balance, and decreased strength.   ACTIVITY LIMITATIONS: carrying, lifting, standing, squatting, stairs, and locomotion level  PARTICIPATION LIMITATIONS: meal prep, cleaning, laundry, shopping, community activity, and church  PERSONAL FACTORS: Fitness, Past/current experiences, Time since onset of injury/illness/exacerbation, and 3+ comorbidities: CHF, seizure, mengingoma  are also affecting patient's functional outcome.   REHAB POTENTIAL: Fair    CLINICAL DECISION MAKING: Stable/uncomplicated  EVALUATION COMPLEXITY: Low   GOALS: Goals reviewed with patient? Yes  SHORT TERM GOALS: Target date: 02/12/23 Pt core and LE strength to increase to allow pt to be able to come sit to stand without UE  Baseline: Goal status: MET  2.  PT to be able to walk 226 ft in 2 minutes with least assistive device.  Baseline:  Goal status: MET  3.  Pt to be able to single leg stance for 5 seconds B to  reduce risk of falling  Baseline:  Goal status: IN PROGRESS    LONG TERM GOALS: Target date: 03/05/23  Pt core and LE strength to increase to allow pt to be able to go up and down 4 steps in a reciprocal manner with one handrail assist.  Baseline:  Goal status:able to do with supervision   2.  PT to be able to walk 300 ft in 2 minutes with least assistive device Baseline:  Goal status: MET  3.  Pt to be able to single leg stance for 10 seconds B to reduce risk of falling  Baseline:  Goal status: IN PROGRESS     PLAN:  PT FREQUENCY: 2x/week  PT DURATION: 4 weeks do continue for 2 extra weeks    PLANNED INTERVENTIONS: Therapeutic exercises, Therapeutic activity, Neuromuscular re-education, Balance training, Gait training, Patient/Family education, and Self Care  PLAN FOR NEXT SESSION: work on balance and strengthening to progress toward goals.   Lurena Nida, PTA/CLT [facility] Ph: [phone]   Lurena Nida, PTA 03/25/2023, 4:19 PM

## 2023-04-01 ENCOUNTER — Other Ambulatory Visit: Payer: Self-pay | Admitting: Family Medicine

## 2023-04-02 ENCOUNTER — Encounter (HOSPITAL_COMMUNITY): Payer: Self-pay | Admitting: Physical Therapy

## 2023-04-02 ENCOUNTER — Ambulatory Visit (HOSPITAL_COMMUNITY): Payer: Medicare Other | Admitting: Physical Therapy

## 2023-04-02 DIAGNOSIS — R262 Difficulty in walking, not elsewhere classified: Secondary | ICD-10-CM | POA: Diagnosis not present

## 2023-04-02 DIAGNOSIS — M6281 Muscle weakness (generalized): Secondary | ICD-10-CM | POA: Diagnosis not present

## 2023-04-02 NOTE — Therapy (Signed)
OUTPATIENT PHYSICAL THERAPY TREATMENT     Patient Name: Tara [name] MRN: [id number] DOB:24-Feb-1946, 77 y.o., female Today's Date: 04/02/2023  PHYSICAL THERAPY DISCHARGE SUMMARY  Visits from Start of Care: 13  Current functional level related to goals / functional outcomes: See below   Remaining deficits: See below   Education / Equipment: See below   Patient agrees to discharge. Patient goals were met. Patient is being discharged due to meeting the stated rehab goals.   END OF SESSION:   PT End of Session - 04/02/23 1425     Visit Number 13    Number of Visits 15    Date for PT Re-Evaluation 04/03/23    Authorization Type [employer] medicare    Progress Note Due on Visit 17    PT Start Time 1428    PT Stop Time 1500    PT Time Calculation (min) 32 min    Equipment Utilized During Treatment Gait belt    Activity Tolerance Patient tolerated treatment well    Behavior During Therapy WFL for tasks assessed/performed              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   Anxiety    Breast cancer (HCC) 2008   right - s/p lumpectomy->chemo, radiation   Depression    Dysrhythmia    hx SVT   Family history of colon cancer    Family history of prostate cancer    GERD (gastroesophageal reflux disease)    H/O: hysterectomy    Hiatal hernia    History of cancer chemotherapy    History of radiation therapy    Hyperlipidemia    Hypertension 01/25/2018   Nonischemic cardiomyopathy (HCC)    Personal history of radiation therapy 01/05/209   SVT (supraventricular tachycardia)    short RP SVT documented 5/14   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HCC)    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)   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 OR;  Service: General;  Laterality: Right;   BREAST SURGERY Right 2008   lumpectomy, cancer   CARDIAC CATHETERIZATION     CHOLECYSTECTOMY  1999   COLONOSCOPY  2008   Dr. Darrick Penna: multiple polyps. Path not available at time of visit.    COLONOSCOPY WITH PROPOFOL N/A 04/25/2014   Dr. Darrick Penna: Multiple tubular adenomas removed. Diverticulosis. Moderate internal hemorrhoids. Next colonoscopy planned for August 2018.   CRANIOTOMY Bilateral 03/24/2022   Procedure: Bifrontal craniotomy for resection of meningioma;  Surgeon: Coletta Memos, MD;  Location: [facility] OR;  Service: Neurosurgery;  Laterality: Bilateral;   ESOPHAGOGASTRODUODENOSCOPY (EGD) WITH PROPOFOL N/A 06/19/2016   Procedure: ESOPHAGOGASTRODUODENOSCOPY (EGD) WITH PROPOFOL;  Surgeon: Corbin Ade, MD;  Location: [facility];  Service: Endoscopy;  Laterality: N/A;  MASTECTOMY W/ SENTINEL NODE BIOPSY Right 06/09/2018   Procedure: RIGHT TOTAL MASTECTOMY WITH SENTINEL LYMPH NODE BIOPSY;  Surgeon: Claud Kelp, MD;  Location: [facility] OR;  Service: General;  Laterality: Right;   POLYPECTOMY N/A 04/25/2014   Procedure: POLYPECTOMY;  Surgeon: West Bali, MD;  Location: [facility];  Service: Endoscopy;  Laterality: N/A;  Ascending and Decending Colon x3 , Transverse colon x2, rectal   PORTACATH PLACEMENT N/A 06/09/2018   Procedure: INSERTION PORT-A-CATH;  Surgeon: Claud Kelp, MD;  Location: [facility] OR;  Service: General;  Laterality: N/A;   RIGHT/LEFT HEART CATH AND CORONARY ANGIOGRAPHY N/A 07/10/2017    Procedure: RIGHT/LEFT HEART CATH AND CORONARY ANGIOGRAPHY;  Surgeon: Laurey Morale, MD;  Location: [facility] INVASIVE CV LAB;  Service: Cardiovascular;  Laterality: N/A;   Patient Active Problem List   Diagnosis Date Noted   Impaired mobility and ADLs 01/05/2023   IGT (impaired glucose tolerance) 01/05/2023   Non-ischemic cardiomyopathy (HCC) 01/05/2023   Constipation 10/26/2022   Status post insertion of percutaneous endoscopic gastrostomy (PEG) tube (HCC) 09/20/2022   Paroxysmal atrial fibrillation (HCC) 09/20/2022   Chronic anticoagulation 09/20/2022   Hiatal hernia 09/20/2022   Encounter for support and coordination of transition of care 08/23/2022   Aspiration pneumonia (HCC) 08/14/2022   Seizure disorder (HCC)    Lethargy 05/15/2022   Dysfunction of right cardiac ventricle 05/07/2022   Acute postoperative anemia due to expected blood loss 05/07/2022   Dysphagia 05/07/2022   Palliative care encounter    Acute respiratory failure with hypoxia (HCC)    Aspiration into airway    Pressure injury of skin 04/07/2022   Meningioma (HCC) 03/24/2022   Seizure (HCC) 03/15/2022   Atrial fibrillation, chronic (HCC) 03/15/2022   Protein-calorie malnutrition, moderate (HCC) 03/15/2022   Atrial fibrillation with RVR (HCC)    Metabolic acidosis 02/27/2022   Acute renal failure superimposed on stage 3b chronic kidney disease (HCC) 02/27/2022   Anxiety and depression 02/27/2022   Meningioma, cerebral (HCC) 02/27/2022   Normocytic anemia 02/27/2022   Acute renal failure (ARF) (HCC) 02/27/2022   Hemorrhoids 01/04/2021   Ankle deformity, right 09/12/2020   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   Port-A-Cath in place 09/28/2018   Colon adenomas 08/06/2018   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 (HCC) 06/09/2018   Hypertension 01/25/2018   AKI (acute kidney injury) (HCC) 10/25/2016   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   Acute on chronic combined systolic and  diastolic CHF (congestive heart failure) (HCC)    Demand ischemia    Hypokalemia 09/29/2016   Prolonged Q-T interval on ECG 09/29/2016   Chronic kidney disease (CKD), stage III (moderate) (HCC) 09/29/2016   Leukocytosis 09/18/2016   Abnormal CT scan, chest    Upper airway cough syndrome  vs Cough variant asthma  04/09/2016   Chronic cough 03/17/2016   Seasonal allergies 08/08/2014   Family history of colon cancer 03/30/2014   Osteoporosis 02/14/2014   Metabolic syndrome X 02/05/2014   SVT (supraventricular tachycardia) 03/14/2013   Chronic systolic CHF (congestive heart failure) (HCC) 03/14/2013   Vitamin D deficiency 07/23/2010   Malignant neoplasm of right breast (HCC) 12/13/2007   Hyperlipidemia 12/13/2007   Anxiety state 12/13/2007   Depression, major, single episode, 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   GERD 12/13/2007    PCP: Syliva Overman  REFERRING PROVIDER: Syliva Overman  REFERRING DIAG:  [id number] (ICD-10-CM) - Impaired mobility and ADLs    THERAPY DIAG:  Muscle weakness  Rationale for Evaluation and Treatment: Rehabilitation  ONSET DATE: 7/23  SUBJECTIVE STATEMENT:  Pt states HEP going well. Patient state 100% improvement since beginning PT. Her husband has been making her walk at home home more.   PERTINENT HISTORY: CHF, osteoporosis, anxiety,cerebral meningioma, seizures, PEG placement in abdomen PAIN:  Are you having pain? No  PRECAUTIONS: Fall  WEIGHT BEARING RESTRICTIONS: No  FALLS:  Has patient fallen in last 6 months? No  LIVING ENVIRONMENT: Lives with: lives with their family Lives in: House/apartment Stairs: Yes: Internal: 13 steps; on right going up and External: 3 steps; on right going up Has following equipment at home: Dan Humphreys - 2 wheeled  OCCUPATION: retired  PLOF: INDEPENDENT prior to the seizures.   PATIENT GOALS: to be more Independent; be able to go to basement and do laundry  NEXT MD VISIT: 8/24   OBJECTIVE:    DIAGNOSTIC FINDINGS:  IMPRESSION: 1. 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 2. Diffuse pachymeningeal thickening and enhancement over both cerebral hemispheres, more prominent over the frontal lobes at the midline measuring up to 7 mm in thickness, favored to reflect evolving postoperative change. Recommend follow-up in 3-6 months to ensure stability.     Electronically Signed   By: Lesia Hausen M.D.   On: 10/17/2022 11:03     COGNITION: Overall cognitive status: Within functional limits for tasks assessed    LOWER EXTREMITY MMT:  MMT Right eval Left eval Right 03/11/23 Left  03/11/23 Right 04/02/23 Left 04/02/23  Hip flexion 5 4 5  4+ 5 5  Hip extension 3- 3- 3 3 3+ 3+  Hip abduction 3 3 3+ 3+ 4+ 4  Hip adduction        Hip internal rotation        Hip external rotation        Knee flexion 3 3 4 4 5 5   Knee extension 5 5   5 5   Ankle dorsiflexion 4 4 4+ 4+ 5 5  Ankle plantarflexion        Ankle inversion        Ankle eversion         (Blank rows = not tested)  FUNCTIONAL TESTS:   03/11/23 30 seconds chair stand test:  8X no UE   2 minute walk test: 226 ft with rolling walker Single leg stance:  Rt: 1" , Lt: 2"   Evaluation:  30 seconds chair stand test:  unable to come sit to stand without UE assist at this time.   2 minute walk test: 168 ft with rolling walker Single leg stance:  unable    Reassessment 04/02/23 30 seconds chair stand test:  8X no UE   2 minute walk test: 325 ft with rolling walker Single leg stance:  Rt: 5" , Lt: 2" Stairs: bilateral UE support, labored, no assist required   TODAY'S TREATMENT:  DATE: 04/02/23 Reassessment    03/25/23 Heelraises 20X on  incline Forward amb in // with Rt UE only and retro return 3RT Side stepping in // with Rt UE only 2RT Slant board stretch 3X30" 4" lunges no UE assist 10X2 each with 1 HHA 4" forward step ups with 1 HHA 2X10 each 4" lateral step ups with 1 HHA 2X10 each Gait with SPC 100 feet  03/23/23:  Gait x 2 minutes with RW Steps with hand rail assist x 12 steps   Squat x 15 Toe raise x 15  Slant board stretch x 1 min x 2  Toe raise x 15 Side step with green theraband x 2 Rt in // Retro gait in // Sit to stand no UE assist Marching x10 03/20/23: Rocker board x 3 minutes Sit to stand x 15  Heel raise x 15 Lunge with leg on second step x 10 B Squat x 15 Toe raise x 15  Slant board stretch x 1 min x 2  Toe raise x 10 Side step with green theraband x 2 Rt in // Retro gait in // Sit to stand no UE assist 03/17/23 Stairs x 3 reps  Sit to stand x 15  Heel raise x 15 Lunge with leg on second step x 10 B Squat x 15 Toe raise x 15  Slant board stretch x 1 min x 2  Toe raise x10 03/13/23 Sit to stand: No UE assist and pt holds balance for 5 seconds each time x 10 Heel raises x 15 Toe raises x 10 Step up 6" x 10 B Step down 6" B x 10 Functional squat x 10  Single leg stance with one hand hold x 5 B   Sitting: Ankle dorsiflexion x 10  03/11/23 FUNCTIONAL TESTS:  30 seconds chair stand test:  6/19: 8X no UE from standard chair (was unable to come sit to stand without UE assist)  2 minute walk test: 6/19:  226 feet with RW (was 168 ft with rolling walker at evaluation) Single leg stance: 6/19:  Rt: 1", Lt:2" max no HHA (was unable at evaluation)  MMT see above  Goal review see below  02/23/23: Heel raises x 15 Squat x 15 one hand hold only  March x 15 one hand hold only  Step up 4" one hand only x 10 B Step down 4" rt and lt x 10 each Side step with green tband x 3 RT in // Sit to stand x 15  NBS for balance   PATIENT EDUCATION:  Education details: HEP Person educated: Patient  and Spouse Education method: Explanation, Actor cues, and Handouts Education comprehension: returned demonstration  HOME EXERCISE PROGRAM: Access Code: [email or web address] URL: [email or web address] Date: 01/22/2023 Prepared by: Virgina Organ Exercises - Supine Bridge  - 1 x daily - 7 x weekly - 1 sets - 10 reps - 3" hold - Active Straight Leg Raise with Quad Set  - 1 x daily - 7 x weekly - 1 sets - 10 reps - 3" hold - Sidelying Hip Abduction  - 1 x daily - 7 x weekly - 1 sets - 10 reps - 3" hold - Seated Ankle Dorsiflexion AROM  - 1 x daily - 7 x weekly - 1 sets - 10 reps - 3" hold  03/23/23: - Sit to Stand with Counter Support  - 1 x daily - 7 x weekly - 1 sets - 10 reps - Standing Heel Raises  - 1 x daily -  7 x weekly - 1 sets - 15 reps - 5' hold - Mini Squat with Counter Support  - 1 x daily - 7 x weekly - 1 sets - 15 reps - Side Stepping with Unilateral Counter Support  - 1 x daily - 7 x weekly - 1 sets - 10 reps - Standing Romberg to 3/4 Tandem Stance  - 1 x daily - 7 x weekly - 1 sets - 10 reps  ASSESSMENT:  CLINICAL IMPRESSION:  Patient has met 2/3 short term goals and 1/3 long term goals with ability to complete HEP and improvement in symptoms, strength, activity tolerance, gait, balance, and functional mobility. Remaining goals not met due to continued deficits in balance and functional mobility. Patient has made good progress toward remaining goals and feels ready to d/c to HEP at this time. Patient educated on continuing HEP. Patient discharged from PT.   OBJECTIVE IMPAIRMENTS: cardiopulmonary status limiting activity, decreased activity tolerance, decreased balance, and decreased strength.   ACTIVITY LIMITATIONS: carrying, lifting, standing, squatting, stairs, and locomotion level  PARTICIPATION LIMITATIONS: meal prep, cleaning, laundry, shopping, community activity, and church  PERSONAL FACTORS: Fitness, Past/current experiences, Time since onset of  injury/illness/exacerbation, and 3+ comorbidities: CHF, seizure, mengingoma  are also affecting patient's functional outcome.   REHAB POTENTIAL: Fair    CLINICAL DECISION MAKING: Stable/uncomplicated  EVALUATION COMPLEXITY: Low   GOALS: Goals reviewed with patient? Yes  SHORT TERM GOALS: Target date: 02/12/23 Pt core and LE strength to increase to allow pt to be able to come sit to stand without UE  Baseline: Goal status: MET  2.  PT to be able to walk 226 ft in 2 minutes with least assistive device.  Baseline:  Goal status: MET  3.  Pt to be able to single leg stance for 5 seconds B to reduce risk of falling  Baseline:  Goal status: NOT MET    LONG TERM GOALS: Target date: 03/05/23  Pt core and LE strength to increase to allow pt to be able to go up and down 4 steps in a reciprocal manner with one handrail assist.  Baseline:  Goal status:NOT MET  2.  PT to be able to walk 300 ft in 2 minutes with least assistive device Baseline:  Goal status: MET  3.  Pt to be able to single leg stance for 10 seconds B to reduce risk of falling  Baseline:  Goal status: NOT MET     PLAN:  PT FREQUENCY: 2x/week  PT DURATION: 4 weeks do continue for 2 extra weeks    PLANNED INTERVENTIONS: Therapeutic exercises, Therapeutic activity, Neuromuscular re-education, Balance training, Gait training, Patient/Family education, and Self Care  PLAN FOR NEXT SESSION: n/a     Reola Mosher Darnella Zeiter, PT 04/02/2023, 3:04 PM

## 2023-04-08 ENCOUNTER — Ambulatory Visit (HOSPITAL_COMMUNITY): Payer: Medicare Other

## 2023-04-09 ENCOUNTER — Encounter: Payer: Medicare Other | Admitting: Family Medicine

## 2023-04-11 DIAGNOSIS — R1312 Dysphagia, oropharyngeal phase: Secondary | ICD-10-CM | POA: Diagnosis not present

## 2023-04-11 DIAGNOSIS — E440 Moderate protein-calorie malnutrition: Secondary | ICD-10-CM | POA: Diagnosis not present

## 2023-04-11 DIAGNOSIS — D329 Benign neoplasm of meninges, unspecified: Secondary | ICD-10-CM | POA: Diagnosis not present

## 2023-04-11 DIAGNOSIS — R1314 Dysphagia, pharyngoesophageal phase: Secondary | ICD-10-CM | POA: Diagnosis not present

## 2023-04-13 ENCOUNTER — Other Ambulatory Visit (HOSPITAL_COMMUNITY): Payer: Self-pay | Admitting: Neurosurgery

## 2023-04-13 DIAGNOSIS — J9601 Acute respiratory failure with hypoxia: Secondary | ICD-10-CM | POA: Diagnosis not present

## 2023-04-13 DIAGNOSIS — D329 Benign neoplasm of meninges, unspecified: Secondary | ICD-10-CM

## 2023-04-13 DIAGNOSIS — J690 Pneumonitis due to inhalation of food and vomit: Secondary | ICD-10-CM | POA: Diagnosis not present

## 2023-04-16 ENCOUNTER — Ambulatory Visit (HOSPITAL_COMMUNITY)
Admission: RE | Admit: 2023-04-16 | Discharge: 2023-04-16 | Disposition: A | Discharge status: Home / Self Care | Payer: Medicare Other | Source: Ambulatory Visit | Attending: Neurosurgery | Admitting: Neurosurgery

## 2023-04-16 DIAGNOSIS — D329 Benign neoplasm of meninges, unspecified: Secondary | ICD-10-CM | POA: Diagnosis not present

## 2023-04-16 DIAGNOSIS — I6782 Cerebral ischemia: Secondary | ICD-10-CM | POA: Diagnosis not present

## 2023-04-16 DIAGNOSIS — G9389 Other specified disorders of brain: Secondary | ICD-10-CM | POA: Diagnosis not present

## 2023-04-16 DIAGNOSIS — D352 Benign neoplasm of pituitary gland: Secondary | ICD-10-CM | POA: Diagnosis not present

## 2023-04-16 MED ORDER — GADOBUTROL 1 MMOL/ML IV SOLN
7.0000 mL | Freq: Once | INTRAVENOUS | Status: AC | PRN
  Administered 2023-04-16: 7 mL via INTRAVENOUS

## 2023-04-17 DIAGNOSIS — H524 Presbyopia: Secondary | ICD-10-CM | POA: Diagnosis not present

## 2023-05-01 ENCOUNTER — Other Ambulatory Visit: Payer: Self-pay | Admitting: Family Medicine

## 2023-05-07 DIAGNOSIS — D329 Benign neoplasm of meninges, unspecified: Secondary | ICD-10-CM | POA: Diagnosis not present

## 2023-05-12 DIAGNOSIS — D329 Benign neoplasm of meninges, unspecified: Secondary | ICD-10-CM | POA: Diagnosis not present

## 2023-05-12 DIAGNOSIS — E440 Moderate protein-calorie malnutrition: Secondary | ICD-10-CM | POA: Diagnosis not present

## 2023-05-12 DIAGNOSIS — R1314 Dysphagia, pharyngoesophageal phase: Secondary | ICD-10-CM | POA: Diagnosis not present

## 2023-05-12 DIAGNOSIS — R1312 Dysphagia, oropharyngeal phase: Secondary | ICD-10-CM | POA: Diagnosis not present

## 2023-05-13 ENCOUNTER — Telehealth: Payer: Self-pay | Admitting: Neurology

## 2023-05-13 NOTE — Telephone Encounter (Signed)
LVM and sent mychart msg informing pt of cx for tomorrow's appt- MD out.

## 2023-05-14 ENCOUNTER — Ambulatory Visit: Payer: Medicare Other | Admitting: Neurology

## 2023-05-14 ENCOUNTER — Telehealth: Payer: Self-pay | Admitting: Neurology

## 2023-05-14 DIAGNOSIS — J690 Pneumonitis due to inhalation of food and vomit: Secondary | ICD-10-CM | POA: Diagnosis not present

## 2023-05-14 DIAGNOSIS — Z789 Other specified health status: Secondary | ICD-10-CM | POA: Diagnosis not present

## 2023-05-14 DIAGNOSIS — J9601 Acute respiratory failure with hypoxia: Secondary | ICD-10-CM | POA: Diagnosis not present

## 2023-05-14 DIAGNOSIS — R1319 Other dysphagia: Secondary | ICD-10-CM | POA: Diagnosis not present

## 2023-05-14 DIAGNOSIS — K3184 Gastroparesis: Secondary | ICD-10-CM | POA: Diagnosis not present

## 2023-05-14 DIAGNOSIS — R1312 Dysphagia, oropharyngeal phase: Secondary | ICD-10-CM | POA: Diagnosis not present

## 2023-05-14 NOTE — Telephone Encounter (Signed)
Pt's daughter is asking if pt needs to come in for lab work between now and the November appointment date, please call

## 2023-05-14 NOTE — Telephone Encounter (Signed)
Last lab was in February, we can recheck it at next visit in November. Thanks

## 2023-05-29 ENCOUNTER — Other Ambulatory Visit: Payer: Self-pay | Admitting: Family Medicine

## 2023-06-03 ENCOUNTER — Encounter: Payer: Self-pay | Admitting: Family Medicine

## 2023-06-03 ENCOUNTER — Other Ambulatory Visit: Payer: Self-pay | Admitting: Family Medicine

## 2023-06-03 ENCOUNTER — Telehealth: Payer: Self-pay

## 2023-06-03 ENCOUNTER — Ambulatory Visit (INDEPENDENT_AMBULATORY_CARE_PROVIDER_SITE_OTHER): Payer: Medicare Other | Admitting: Family Medicine

## 2023-06-03 ENCOUNTER — Ambulatory Visit (HOSPITAL_COMMUNITY)
Admission: RE | Admit: 2023-06-03 | Discharge: 2023-06-03 | Disposition: A | Discharge status: Home / Self Care | Payer: Medicare Other | Source: Ambulatory Visit | Attending: Family Medicine | Admitting: Family Medicine

## 2023-06-03 ENCOUNTER — Other Ambulatory Visit: Payer: Self-pay

## 2023-06-03 VITALS — BP 119/73 | HR 83 | Temp 97.9°F | Ht <= 58 in | Wt 164.0 lb

## 2023-06-03 DIAGNOSIS — R509 Fever, unspecified: Secondary | ICD-10-CM | POA: Insufficient documentation

## 2023-06-03 DIAGNOSIS — R1314 Dysphagia, pharyngoesophageal phase: Secondary | ICD-10-CM

## 2023-06-03 DIAGNOSIS — Z13220 Encounter for screening for lipoid disorders: Secondary | ICD-10-CM | POA: Diagnosis not present

## 2023-06-03 DIAGNOSIS — R051 Acute cough: Secondary | ICD-10-CM

## 2023-06-03 DIAGNOSIS — R300 Dysuria: Secondary | ICD-10-CM | POA: Diagnosis not present

## 2023-06-03 DIAGNOSIS — I517 Cardiomegaly: Secondary | ICD-10-CM | POA: Diagnosis not present

## 2023-06-03 DIAGNOSIS — R7302 Impaired glucose tolerance (oral): Secondary | ICD-10-CM

## 2023-06-03 DIAGNOSIS — R059 Cough, unspecified: Secondary | ICD-10-CM | POA: Diagnosis not present

## 2023-06-03 DIAGNOSIS — K2100 Gastro-esophageal reflux disease with esophagitis, without bleeding: Secondary | ICD-10-CM

## 2023-06-03 DIAGNOSIS — D71 Functional disorders of polymorphonuclear neutrophils: Secondary | ICD-10-CM | POA: Diagnosis not present

## 2023-06-03 DIAGNOSIS — G40909 Epilepsy, unspecified, not intractable, without status epilepticus: Secondary | ICD-10-CM

## 2023-06-03 DIAGNOSIS — R630 Anorexia: Secondary | ICD-10-CM

## 2023-06-03 DIAGNOSIS — N1831 Chronic kidney disease, stage 3a: Secondary | ICD-10-CM

## 2023-06-03 LAB — BMP8+EGFR
BUN/Creatinine Ratio: 10 — ABNORMAL LOW (ref 12–28)
BUN: 19 mg/dL (ref 8–27)
CO2: 19 mmol/L — ABNORMAL LOW (ref 20–29)
Calcium: 8.6 mg/dL — ABNORMAL LOW (ref 8.7–10.3)
Chloride: 112 mmol/L — ABNORMAL HIGH (ref 96–106)
Creatinine, Ser: 1.86 mg/dL — ABNORMAL HIGH (ref 0.57–1.00)
Glucose: 85 mg/dL (ref 70–99)
Potassium: 3.9 mmol/L (ref 3.5–5.2)
Sodium: 147 mmol/L — ABNORMAL HIGH (ref 134–144)
eGFR: 28 mL/min/{1.73_m2} — ABNORMAL LOW (ref >59)

## 2023-06-03 LAB — CBC WITH DIFFERENTIAL/PLATELET
Basophils Absolute: 0 10*3/uL (ref 0.0–0.2)
Basos: 1 %
EOS (ABSOLUTE): 0.2 10*3/uL (ref 0.0–0.4)
Eos: 3 %
Hematocrit: 35.2 % (ref 34.0–46.6)
Hemoglobin: 11.1 g/dL (ref 11.1–15.9)
Immature Grans (Abs): 0 10*3/uL (ref 0.0–0.1)
Immature Granulocytes: 0 %
Lymphocytes Absolute: 2 10*3/uL (ref 0.7–3.1)
Lymphs: 25 %
MCH: 26.6 pg (ref 26.6–33.0)
MCHC: 31.5 g/dL (ref 31.5–35.7)
MCV: 84 fL (ref 79–97)
Monocytes Absolute: 0.7 10*3/uL (ref 0.1–0.9)
Monocytes: 9 %
Neutrophils Absolute: 5.1 10*3/uL (ref 1.4–7.0)
Neutrophils: 62 %
Platelets: 251 10*3/uL (ref 150–450)
RBC: 4.17 x10E6/uL (ref 3.77–5.28)
RDW: 16 % — ABNORMAL HIGH (ref 11.7–15.4)
WBC: 8.1 10*3/uL (ref 3.4–10.8)

## 2023-06-03 LAB — URINALYSIS
Glucose, UA: NEGATIVE
Ketones, UA: NEGATIVE
Leukocytes,UA: NEGATIVE
Nitrite, UA: NEGATIVE
Specific Gravity, UA: 1.03 (ref 1.005–1.030)
Urobilinogen, Ur: 2 mg/dL — ABNORMAL HIGH (ref 0.2–1.0)
pH, UA: 5.5 (ref 5.0–7.5)

## 2023-06-03 LAB — HEMOGLOBIN A1C
Est. average glucose Bld gHb Est-mCnc: 131 mg/dL
Hgb A1c MFr Bld: 6.2 % — ABNORMAL HIGH (ref 4.8–5.6)

## 2023-06-03 MED ORDER — BENZONATATE 100 MG PO CAPS: 100.0000 mg | ORAL_CAPSULE | Freq: Two times a day (BID) | ORAL | 0 refills | Status: DC | PRN

## 2023-06-03 MED ORDER — PENICILLIN V POTASSIUM 500 MG PO TABS: 500.0000 mg | ORAL_TABLET | Freq: Three times a day (TID) | ORAL | 0 refills | Status: DC

## 2023-06-03 MED ORDER — GUAIFENESIN-DM 100-10 MG/5ML PO SYRP: ORAL_SOLUTION | ORAL | 0 refills | Status: DC

## 2023-06-03 NOTE — Assessment & Plan Note (Signed)
Controlled, no change in medication  

## 2023-06-03 NOTE — Progress Notes (Signed)
   Tara [name]     MRN: [id number]      DOB: [birth year]  Chief Complaint  Patient presents with   Cough    Cough congestion fever productive cough x 3 days stomach issues since labor day no appetite just wants to sleep tired     HPI Tara [name] is here with a 10-day history of cough fever poor appetite and low energy.  She has no known sick contact.  She does have mild dysuria. Prior to that she had been doing well.     ROS As above. Denies chest congestion, productive cough or wheezing. Denies c leg swelling Denies abdominal pain,  has had some  nausea, no vomiting,diarrhea , took laxative with success for mild or constipation.    Denies headaches, seizures, numbness, or tingling. Denies depression, anxiety or insomnia. Denies skin break down or rash.   PE  BP 119/73 (BP Location: Right Arm, Patient Position: Sitting, Cuff Size: Normal)   Pulse 83   Temp 97.9 F (36.6 C) (Oral)   Ht 4\' 9"  (1.448 m)   Wt 164 lb 0.6 oz (74.4 kg)   SpO2 92%   BMI 35.50 kg/m   Patient alert and oriented and in no cardiopulmonary distress.  HEENT: No facial asymmetry, EOMI,     Neck supple .  Chest:decreased air entry , bibasilar crackles  CVS: S1, S2 no murmurs, no S3.Regular rate.  ABD: Soft non tender.   Ext: No edema  MS: decreased e ROM spine, shoulders, hips and knees.  Skin: Intact, no ulcerations or rash noted.  Psych: Good eye contact, normal affect. Memory intact not anxious or depressed appearing.  CNS: CN 2-12 intact, power,  normal throughout.no focal deficits noted.   Assessment & Plan  Fever 10 day h/o intermittent fever , cough, poor appetite and low energy High risk of aspiration pneumonia. No known sick contact cXR, cbc and diff , chem 7 and EGFr stat Tessalon perle, z pack and phenergan DM Viral swabs  Dysuria Ccua and c/s if abn  GERD Controlled, no change in medication   Seizure disorder (HCC) Controlled, no change in medication

## 2023-06-03 NOTE — Patient Instructions (Addendum)
F/u in October as before , ca;ll iof you needme sooner  cXR today  Stat cBc and  diff chem 7 and EGFr, hBA1C   CCUA, c/s if abnormal  Covid, RSv and influenza titer  Tessalon perls,pen icillin,  and Robitussin  dM are prescribed   If you worsen need to go to the ED  Please intentionally increase food and water intake ti get stronger  Thanks for choosing [facility], we consider it a privelige to serve you.

## 2023-06-03 NOTE — Assessment & Plan Note (Signed)
Ccua and c/s if abn

## 2023-06-03 NOTE — Assessment & Plan Note (Addendum)
10 day h/o intermittent fever , cough, poor appetite and low energy High risk of aspiration pneumonia. No known sick contact cXR, cbc and diff , chem 7 and EGFr stat Tessalon perle, z pack and phenergan DM Viral swabs

## 2023-06-03 NOTE — Telephone Encounter (Signed)
Patient daughter aware of lab results and orders to be done next week she is requesting a rx for hemorrhoid cream and suppositories patient forgot to mention at appointment today please send to [facility].

## 2023-06-06 LAB — COVID-19, FLU A+B AND RSV
Influenza A, NAA: NOT DETECTED
Influenza B, NAA: NOT DETECTED
RSV, NAA: NOT DETECTED
SARS-CoV-2, NAA: NOT DETECTED

## 2023-06-07 MED ORDER — HYDROCORTISONE ACETATE 25 MG RE SUPP: 25.0000 mg | Freq: Two times a day (BID) | RECTAL | 3 refills | Status: DC

## 2023-06-07 MED ORDER — HYDROCORTISONE (PERIANAL) 2.5 % EX CREA: 1.0000 | TOPICAL_CREAM | Freq: Two times a day (BID) | CUTANEOUS | 3 refills | Status: DC

## 2023-06-07 NOTE — Addendum Note (Signed)
Addended by: Kerri Perches on: 06/07/2023 07:56 PM   Modules accepted: Orders

## 2023-06-08 ENCOUNTER — Telehealth: Payer: Self-pay | Admitting: Family Medicine

## 2023-06-08 NOTE — Telephone Encounter (Signed)
patient Tara [name] daughter called asked if Dr Lodema Hong could set her up with a nutritionist  call back # [phone].Marland Kitchen

## 2023-06-12 NOTE — Telephone Encounter (Signed)
[id number]-KG 

## 2023-06-12 NOTE — Telephone Encounter (Signed)
Tara [name] returning your call

## 2023-06-14 DIAGNOSIS — J9601 Acute respiratory failure with hypoxia: Secondary | ICD-10-CM | POA: Diagnosis not present

## 2023-06-14 DIAGNOSIS — J690 Pneumonitis due to inhalation of food and vomit: Secondary | ICD-10-CM | POA: Diagnosis not present

## 2023-06-16 ENCOUNTER — Telehealth: Payer: Self-pay | Admitting: Family Medicine

## 2023-06-17 ENCOUNTER — Encounter: Payer: Self-pay | Admitting: Family Medicine

## 2023-06-17 ENCOUNTER — Ambulatory Visit (INDEPENDENT_AMBULATORY_CARE_PROVIDER_SITE_OTHER): Payer: Medicare Other | Admitting: Family Medicine

## 2023-06-17 ENCOUNTER — Other Ambulatory Visit: Payer: Self-pay

## 2023-06-17 VITALS — BP 118/73 | HR 73 | Ht <= 58 in | Wt 162.0 lb

## 2023-06-17 DIAGNOSIS — R053 Chronic cough: Secondary | ICD-10-CM

## 2023-06-17 MED ORDER — GUAIFENESIN-DM 100-10 MG/5ML PO SYRP
ORAL_SOLUTION | ORAL | 0 refills | Status: DC
Start: 2023-06-17 — End: 2023-07-03

## 2023-06-17 MED ORDER — ONDANSETRON HCL 4 MG PO TABS: 4.0000 mg | ORAL_TABLET | Freq: Three times a day (TID) | ORAL | 0 refills | Status: DC | PRN

## 2023-06-17 NOTE — Assessment & Plan Note (Addendum)
Recommended supportive care with nonpharmacological management of cough: -Hydration: recommend drinking plenty of fluids to help thin mucus and soothe the throat. -Humidification: Using a humidifier can add moisture to the air, helping to relieve irritation in the airways. -Warm Saltwater Gargles: Gargling with warm salt water can help soothe a sore throat and reduce coughing. -Honey: Consuming honey  can coat the throat and may reduce coughing. -Lozenges: Throat lozenges can help soothe throat irritation and reduce the urge to cough. -Rest: Ensuring adequate rest can help the body heal and may reduce coughing. -Avoid Irritants: Steering clear of smoke, strong odors, and other irritants can prevent worsening of cough symptoms. -Elevation: Sleeping with the head elevated can help reduce nighttime coughing, especially if post-nasal drip is a contributing factor. -Steam Inhalation: Inhaling steam from a hot shower or bowl of hot water can help loosen mucus and ease coughing. -Dietary Adjustments: Avoiding foods that can trigger cough, such as spicy foods or dairy may also be beneficial.

## 2023-06-17 NOTE — Progress Notes (Signed)
Acute Office Visit  Subjective:    Patient ID: Tara [name], female    DOB: 1946-05-17, 77 y.o.   MRN: [id number]  Chief Complaint  Patient presents with   Cough    Productive cough, weak tessalon pearles not helping     HPI The patient presents today with complaints of cough and fatigue that have been ongoing for the past two weeks. She denies experiencing shortness of breath, hemoptysis, sore throat, nasal congestion, wheezing, chest discomfort, chest pain, postnasal drip, facial pain, and dyspnea. Notably, she tested negative for COVID-19, influenza, and RSV on June 03, 2023.    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   Anxiety    Breast cancer (HCC) 2008   right - s/p lumpectomy->chemo, radiation   Depression    Dysrhythmia    hx SVT   Family history of colon cancer    Family history of prostate cancer    GERD (gastroesophageal reflux disease)    H/O: hysterectomy    Hiatal hernia    History of cancer chemotherapy    History of radiation therapy    Hyperlipidemia    Hypertension 01/25/2018   Nonischemic cardiomyopathy (HCC)    Personal history of radiation therapy 01/05/209   SVT (supraventricular tachycardia)    short RP SVT documented 5/14   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HCC)    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)   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    Past Surgical History:  Procedure Laterality Date   ABDOMINAL HYSTERECTOMY  1994   fibroids,    BIOPSY  06/19/2016   Procedure: BIOPSY;  Surgeon: Corbin Ade, MD;  Location: [facility];  Service: Endoscopy;;  gastric duodenum   BREAST BIOPSY Right 12/16/2006   malignant   BREAST EXCISIONAL BIOPSY Right 2018   benign lumpectomy   BREAST LUMPECTOMY Right 02/2007   BREAST LUMPECTOMY WITH RADIOACTIVE SEED LOCALIZATION Right 07/29/2017   Procedure: RIGHT BREAST  LUMPECTOMY WITH RADIOACTIVE SEED LOCALIZATION;  Surgeon: Claud Kelp, MD;  Location: [facility] OR;  Service: General;  Laterality: Right;   BREAST SURGERY Right 2008   lumpectomy, cancer   CARDIAC CATHETERIZATION     CHOLECYSTECTOMY  1999   COLONOSCOPY  2008   Dr. Darrick Penna: multiple polyps. Path not available at time of visit.    COLONOSCOPY WITH PROPOFOL N/A 04/25/2014   Dr. Darrick Penna: Multiple tubular adenomas removed. Diverticulosis. Moderate internal hemorrhoids. Next colonoscopy planned for August 2018.   CRANIOTOMY Bilateral 03/24/2022   Procedure: Bifrontal craniotomy for resection of meningioma;  Surgeon: Coletta Memos, MD;  Location: [facility] OR;  Service: Neurosurgery;  Laterality: Bilateral;   ESOPHAGOGASTRODUODENOSCOPY (EGD) WITH PROPOFOL N/A 06/19/2016   Procedure: ESOPHAGOGASTRODUODENOSCOPY (EGD) WITH PROPOFOL;  Surgeon: Corbin Ade, MD;  Location: [facility];  Service: Endoscopy;  Laterality: N/A;   MASTECTOMY W/ SENTINEL NODE BIOPSY Right 06/09/2018   Procedure: RIGHT TOTAL MASTECTOMY WITH SENTINEL LYMPH NODE BIOPSY;  Surgeon: Claud Kelp, MD;  Location: [facility] OR;  Service: General;  Laterality: Right;   POLYPECTOMY N/A 04/25/2014   Procedure: POLYPECTOMY;  Surgeon: West Bali, MD;  Location: [facility] ORS;  Service: Endoscopy;  Laterality: N/A;  Ascending and Decending Colon x3 , Transverse colon x2, rectal   PORTACATH PLACEMENT N/A 06/09/2018   Procedure: INSERTION PORT-A-CATH;  Surgeon: Claud Kelp, MD;  Location:  [facility] OR;  Service: General;  Laterality: N/A;   RIGHT/LEFT HEART CATH AND CORONARY ANGIOGRAPHY N/A 07/10/2017   Procedure: RIGHT/LEFT HEART CATH AND CORONARY ANGIOGRAPHY;  Surgeon: Laurey Morale, MD;  Location: [facility] INVASIVE CV LAB;  Service: Cardiovascular;  Laterality: N/A;    Family History  Problem Relation Age of Onset   Dementia Mother    Stroke Mother 71       left hemiparesis   Colon cancer Father 52       died at age 16   Breast cancer Sister    Hypertension  Sister    Heart disease Sister    Cancer Sister        unknown form   Cancer Paternal Aunt        NOS   Other Paternal Aunt        old age   Lung cancer Paternal Uncle    Other Paternal Uncle        lightning strike   Other Paternal Uncle        hit by train   Cancer Cousin        NOS pat first cousin   Cancer Cousin        NOS pat first cousin   Prostate cancer Cousin        pat first cousin   Cancer Brother 82       prostate    Social History   Socioeconomic History   Marital status: Married    Spouse name: Not on file   Number of children: 2   Years of education: Not on file   Highest education level: Not on file  Occupational History   Occupation: disabled     Employer: UNEMPLOYED  Tobacco Use   Smoking status: Never   Smokeless tobacco: Never  Vaping Use   Vaping status: Never Used  Substance and Sexual Activity   Alcohol use: No   Drug use: No   Sexual activity: Not on file  Other Topics Concern   Not on file  Social History Narrative   Lives in [place] with family.  Does not routinely exercise.   Social Determinants of Health   Financial Resource Strain: Low Risk  (07/07/2022)   Received from [facility]   Overall Financial Resource Strain (CARDIA)    Difficulty of Paying Living Expenses: Not hard at all  Food Insecurity: No Food Insecurity (08/14/2022)   Hunger Vital Sign    Worried About Running Out of Food in the Last Year: Never true    Ran Out of Food in the Last Year: Never true  Transportation Needs: No Transportation Needs (08/14/2022)   PRAPARE - Administrator, Civil Service (Medical): No    Lack of Transportation (Non-Medical): No  Physical Activity: Insufficiently Active (05/01/2021)   Exercise Vital Sign    Days of Exercise per Week: 3 days    Minutes of Exercise per Session: 30 min  Stress: No Stress Concern Present (07/07/2022)   Received from [facility]  Occupational Health - Occupational Stress Questionnaire    Feeling of Stress : Not at all  Social Connections: Unknown (07/06/2022)   Received from [facility], [facility]   Social Network    Social Network: Not on file  Intimate Partner Violence: Not At Risk (08/14/2022)   Humiliation, Afraid, Rape, and Kick questionnaire    Fear of Current or Ex-Partner: No    Emotionally Abused:  No    Physically Abused: No    Sexually Abused: No    Outpatient Medications Prior to Visit  Medication Sig Dispense Refill   acetaminophen (TYLENOL) 500 MG tablet Place 500-1,000 mg into feeding tube every 6 (six) hours as needed for mild pain.     amiodarone (PACERONE) 200 MG tablet TAKE 1 TABLET VIA G-TUBE ONCE DAILY. 30 tablet 0   benzonatate (TESSALON) 100 MG capsule Take 1 capsule (100 mg total) by mouth 2 (two) times daily as needed for cough. 20 capsule 0   clonazePAM (KLONOPIN) 0.5 MG tablet TAKE (1) TABLET BY MOUTH AT BEDTIME. 30 tablet 0   clonazePAM (KLONOPIN) 0.5 MG tablet Take 1 tablet (0.5 mg total) by mouth daily. 30 tablet 5   docusate (COLACE) 50 MG/5ML liquid 5 ml via  tube daily 100 mL 1   ELIQUIS 5 MG TABS tablet TAKE 1 TABLET VIA G-TUBE 2 TIMES DAILY. 60 tablet 0   hydrocortisone (ANUSOL-HC) 2.5 % rectal cream Place 1 Application rectally 2 (two) times daily. 30 g 3   hydrocortisone (ANUSOL-HC) 25 MG suppository Place 1 suppository (25 mg total) rectally 2 (two) times daily. 12 suppository 3   Lacosamide 100 MG TABS TAKE 1 TABLET BYMOUTH IN THE MORNING AND AT BEDTIME 60 tablet 5   levETIRAcetam (KEPPRA) 750 MG tablet Take 1 tablet (750 mg total) by mouth 2 (two) times daily. 120 tablet 11   pantoprazole (PROTONIX) 40 MG tablet TAKE (1) TABLET BY MOUTH TWICE DAILY. 60 tablet 0   penicillin v potassium (VEETID) 500 MG tablet Take 1 tablet (500 mg total) by mouth 3 (three) times daily. 30 tablet 0   polyethylene glycol powder (GLYCOLAX/MIRALAX) 17 GM/SCOOP powder 17 gm via feeding tube  daily 3350 g 1   Protein (FEEDING SUPPLEMENT, PROSOURCE TF20,) liquid Place 60 mLs into feeding tube daily. 1800 mL 3   rosuvastatin (CRESTOR) 20 MG tablet TAKE ONE TABLET BY MOUTH ONCE DAILY. 30 tablet 0   sertraline (ZOLOFT) 25 MG tablet TAKE 1 TABLET BY MOUTH ONCE A DAY. 30 tablet 0   UNABLE TO FIND Med Name: Prosource-TF 20 Liquid Place 60 MLS into feeding tube daily. 1800 mL 3   UNABLE TO FIND Med Name: Gloves Size:Medium 200 each 12   UNABLE TO FIND Med Name: Disposable Bed Pads Size: XXL 200 each 12   UNABLE TO FIND Med Name: Adult Pull up Briefs Size : L 200 each 12   Water For Irrigation, Sterile (FREE WATER) SOLN Place 100 mLs into feeding tube every 12 (twelve) hours. (Slowly)     guaiFENesin-dextromethorphan (ROBITUSSIN DM) 100-10 MG/5ML syrup Half to on teaspoon at bedtime, as nded, for excessive cough 118 mL 0   No facility-administered medications prior to visit.    Allergies  Allergen Reactions   Aspirin Other (See Comments)    Reports GI bleed--pt is currently taking   Lisinopril Cough   Sudafed [Pseudoephedrine Hcl]     Pt reports she had drainage in throat that made her throat hurt.     Review of Systems  Constitutional:  Positive for fatigue.  HENT:  Positive for postnasal drip. Negative for sinus pressure and sinus pain.   Respiratory:  Positive for cough. Negative for chest tightness, shortness of breath and wheezing.        Objective:    Physical Exam HENT:     Head: Normocephalic.     Mouth/Throat:     Mouth: Mucous membranes are moist.  Cardiovascular:  Rate and Rhythm: Normal rate.     Heart sounds: Normal heart sounds.  Pulmonary:     Effort: Pulmonary effort is normal.     Breath sounds: Normal breath sounds.  Neurological:     Mental Status: She is alert.     BP 118/73 (BP Location: Right Arm, Patient Position: Sitting, Cuff Size: Large)   Pulse 73   Ht 4\' 9"  (1.448 m)   Wt 162 lb (73.5 kg)   SpO2 92%   BMI 35.06 kg/m  Wt  Readings from Last 3 Encounters:  06/17/23 162 lb (73.5 kg)  06/03/23 164 lb 0.6 oz (74.4 kg)  12/31/22 152 lb 0.6 oz (69 kg)       Assessment & Plan:  Chronic cough Assessment & Plan: Recommended supportive care with nonpharmacological management of cough: -Hydration: recommend drinking plenty of fluids to help thin mucus and soothe the throat. -Humidification: Using a humidifier can add moisture to the air, helping to relieve irritation in the airways. -Warm Saltwater Gargles: Gargling with warm salt water can help soothe a sore throat and reduce coughing. -Honey: Consuming honey  can coat the throat and may reduce coughing. -Lozenges: Throat lozenges can help soothe throat irritation and reduce the urge to cough. -Rest: Ensuring adequate rest can help the body heal and may reduce coughing. -Avoid Irritants: Steering clear of smoke, strong odors, and other irritants can prevent worsening of cough symptoms. -Elevation: Sleeping with the head elevated can help reduce nighttime coughing, especially if post-nasal drip is a contributing factor. -Steam Inhalation: Inhaling steam from a hot shower or bowl of hot water can help loosen mucus and ease coughing. -Dietary Adjustments: Avoiding foods that can trigger cough, such as spicy foods or dairy may also be beneficial.   Orders: -     guaiFENesin-DM; Half to on teaspoon at bedtime, as nded, for excessive cough  Dispense: 118 mL; Refill: 0   Note: This chart has been completed using Engineer, civil (consulting) software, and while attempts have been made to ensure accuracy, certain words and phrases may not be transcribed as intended.   Gilmore Laroche, FNP

## 2023-06-17 NOTE — Patient Instructions (Addendum)
I appreciate the opportunity to provide care to you today!    Follow up:  PCP  Please pick up your prescription at the pharmacy and start treatment  Nonpharmacological management of cough include the following approaches: -Hydration: recommend drinking plenty of fluids to help thin mucus and soothe the throat. -Humidification: Using a humidifier can add moisture to the air, helping to relieve irritation in the airways. -Warm Saltwater Gargles: Gargling with warm salt water can help soothe a sore throat and reduce coughing. -Honey: Consuming honey  can coat the throat and may reduce coughing. -Lozenges: Throat lozenges can help soothe throat irritation and reduce the urge to cough. -Rest: Ensuring adequate rest can help the body heal and may reduce coughing. -Avoid Irritants: Steering clear of smoke, strong odors, and other irritants can prevent worsening of cough symptoms. -Elevation: Sleeping with the head elevated can help reduce nighttime coughing, especially if post-nasal drip is a contributing factor. -Steam Inhalation: Inhaling steam from a hot shower or bowl of hot water can help loosen mucus and ease coughing. -Dietary Adjustments: Avoiding foods that can trigger cough, such as spicy foods or dairymay also be beneficial.    Please continue to a heart-healthy diet and increase your physical activities. Try to exercise for at least five days a week.    It was a pleasure to see you and I look forward to continuing to work together on your health and well-being. Please do not hesitate to call the office if you need care or have questions about your care.  In case of emergency, please visit the Emergency Department for urgent care, or contact our clinic at [phone] to schedule an appointment. We're here to help you!   Have a wonderful day and week. With Gratitude, Gilmore Laroche MSN, FNP-BC

## 2023-06-19 ENCOUNTER — Encounter (HOSPITAL_COMMUNITY): Payer: Self-pay | Admitting: *Deleted

## 2023-06-19 ENCOUNTER — Other Ambulatory Visit: Payer: Self-pay

## 2023-06-19 ENCOUNTER — Emergency Department (HOSPITAL_COMMUNITY): Payer: Medicare Other

## 2023-06-19 ENCOUNTER — Inpatient Hospital Stay (HOSPITAL_COMMUNITY)
Admission: EM | Admit: 2023-06-19 | Discharge: 2023-06-22 | DRG: 194 | Disposition: A | Discharge status: Home / Self Care | Payer: Medicare Other | Attending: Internal Medicine | Admitting: Internal Medicine

## 2023-06-19 DIAGNOSIS — R1310 Dysphagia, unspecified: Secondary | ICD-10-CM

## 2023-06-19 DIAGNOSIS — Z90710 Acquired absence of both cervix and uterus: Secondary | ICD-10-CM

## 2023-06-19 DIAGNOSIS — Z803 Family history of malignant neoplasm of breast: Secondary | ICD-10-CM

## 2023-06-19 DIAGNOSIS — F32A Depression, unspecified: Secondary | ICD-10-CM | POA: Diagnosis present

## 2023-06-19 DIAGNOSIS — Z886 Allergy status to analgesic agent status: Secondary | ICD-10-CM

## 2023-06-19 DIAGNOSIS — Z86010 Personal history of colonic polyps: Secondary | ICD-10-CM

## 2023-06-19 DIAGNOSIS — R0902 Hypoxemia: Secondary | ICD-10-CM | POA: Diagnosis not present

## 2023-06-19 DIAGNOSIS — R918 Other nonspecific abnormal finding of lung field: Secondary | ICD-10-CM | POA: Diagnosis not present

## 2023-06-19 DIAGNOSIS — Z800 Family history of malignant neoplasm of digestive organs: Secondary | ICD-10-CM | POA: Diagnosis not present

## 2023-06-19 DIAGNOSIS — Z86011 Personal history of benign neoplasm of the brain: Secondary | ICD-10-CM

## 2023-06-19 DIAGNOSIS — G40909 Epilepsy, unspecified, not intractable, without status epilepticus: Secondary | ICD-10-CM | POA: Diagnosis not present

## 2023-06-19 DIAGNOSIS — Z923 Personal history of irradiation: Secondary | ICD-10-CM

## 2023-06-19 DIAGNOSIS — E785 Hyperlipidemia, unspecified: Secondary | ICD-10-CM | POA: Diagnosis not present

## 2023-06-19 DIAGNOSIS — I480 Paroxysmal atrial fibrillation: Secondary | ICD-10-CM | POA: Diagnosis not present

## 2023-06-19 DIAGNOSIS — I428 Other cardiomyopathies: Secondary | ICD-10-CM | POA: Diagnosis not present

## 2023-06-19 DIAGNOSIS — I5022 Chronic systolic (congestive) heart failure: Secondary | ICD-10-CM | POA: Diagnosis present

## 2023-06-19 DIAGNOSIS — Z8249 Family history of ischemic heart disease and other diseases of the circulatory system: Secondary | ICD-10-CM | POA: Diagnosis not present

## 2023-06-19 DIAGNOSIS — Z853 Personal history of malignant neoplasm of breast: Secondary | ICD-10-CM | POA: Diagnosis not present

## 2023-06-19 DIAGNOSIS — N179 Acute kidney failure, unspecified: Secondary | ICD-10-CM | POA: Diagnosis present

## 2023-06-19 DIAGNOSIS — Z1152 Encounter for screening for COVID-19: Secondary | ICD-10-CM

## 2023-06-19 DIAGNOSIS — Z9011 Acquired absence of right breast and nipple: Secondary | ICD-10-CM

## 2023-06-19 DIAGNOSIS — Z79899 Other long term (current) drug therapy: Secondary | ICD-10-CM

## 2023-06-19 DIAGNOSIS — I110 Hypertensive heart disease with heart failure: Secondary | ICD-10-CM | POA: Diagnosis not present

## 2023-06-19 DIAGNOSIS — R058 Other specified cough: Secondary | ICD-10-CM | POA: Diagnosis not present

## 2023-06-19 DIAGNOSIS — J984 Other disorders of lung: Secondary | ICD-10-CM | POA: Diagnosis not present

## 2023-06-19 DIAGNOSIS — J189 Pneumonia, unspecified organism: Principal | ICD-10-CM | POA: Diagnosis present

## 2023-06-19 DIAGNOSIS — R0602 Shortness of breath: Secondary | ICD-10-CM | POA: Diagnosis not present

## 2023-06-19 DIAGNOSIS — Z9049 Acquired absence of other specified parts of digestive tract: Secondary | ICD-10-CM | POA: Diagnosis not present

## 2023-06-19 DIAGNOSIS — Z7901 Long term (current) use of anticoagulants: Secondary | ICD-10-CM

## 2023-06-19 DIAGNOSIS — Z888 Allergy status to other drugs, medicaments and biological substances status: Secondary | ICD-10-CM

## 2023-06-19 DIAGNOSIS — T17908A Unspecified foreign body in respiratory tract, part unspecified causing other injury, initial encounter: Secondary | ICD-10-CM | POA: Diagnosis present

## 2023-06-19 DIAGNOSIS — R1312 Dysphagia, oropharyngeal phase: Secondary | ICD-10-CM | POA: Diagnosis not present

## 2023-06-19 DIAGNOSIS — Z9221 Personal history of antineoplastic chemotherapy: Secondary | ICD-10-CM | POA: Diagnosis not present

## 2023-06-19 DIAGNOSIS — Z66 Do not resuscitate: Secondary | ICD-10-CM | POA: Diagnosis present

## 2023-06-19 LAB — TROPONIN I (HIGH SENSITIVITY)
Troponin I (High Sensitivity): 13 ng/L (ref <18)
Troponin I (High Sensitivity): 9 ng/L (ref <18)

## 2023-06-19 LAB — BASIC METABOLIC PANEL
Anion gap: 13 (ref 5–15)
BUN: 22 mg/dL (ref 8–23)
CO2: 21 mmol/L — ABNORMAL LOW (ref 22–32)
Calcium: 8.4 mg/dL — ABNORMAL LOW (ref 8.9–10.3)
Chloride: 106 mmol/L (ref 98–111)
Creatinine, Ser: 2.15 mg/dL — ABNORMAL HIGH (ref 0.44–1.00)
GFR, Estimated: 23 mL/min — ABNORMAL LOW (ref >60)
Glucose, Bld: 98 mg/dL (ref 70–99)
Potassium: 3.7 mmol/L (ref 3.5–5.1)
Sodium: 140 mmol/L (ref 135–145)

## 2023-06-19 LAB — CBC
HCT: 34.3 % — ABNORMAL LOW (ref 36.0–46.0)
Hemoglobin: 10.7 g/dL — ABNORMAL LOW (ref 12.0–15.0)
MCH: 25.7 pg — ABNORMAL LOW (ref 26.0–34.0)
MCHC: 31.2 g/dL (ref 30.0–36.0)
MCV: 82.5 fL (ref 80.0–100.0)
Platelets: 236 10*3/uL (ref 150–400)
RBC: 4.16 MIL/uL (ref 3.87–5.11)
RDW: 18 % — ABNORMAL HIGH (ref 11.5–15.5)
WBC: 8.1 10*3/uL (ref 4.0–10.5)
nRBC: 0 % (ref 0.0–0.2)

## 2023-06-19 LAB — SARS CORONAVIRUS 2 BY RT PCR: SARS Coronavirus 2 by RT PCR: NEGATIVE

## 2023-06-19 LAB — HIV ANTIBODY (ROUTINE TESTING W REFLEX): HIV Screen 4th Generation wRfx: NONREACTIVE

## 2023-06-19 MED ORDER — LACTATED RINGERS IV SOLN: INTRAVENOUS | Status: DC

## 2023-06-19 MED ORDER — FOOD THICKENER (SIMPLYTHICK): 1.0000 | ORAL | Status: DC | PRN

## 2023-06-19 MED ORDER — LEVETIRACETAM 500 MG PO TABS
500.0000 mg | ORAL_TABLET | Freq: Two times a day (BID) | ORAL | Status: DC
  Administered 2023-06-19 – 2023-06-22 (×6): 500 mg via ORAL
  Filled 2023-06-19 (×6): qty 1

## 2023-06-19 MED ORDER — DOCUSATE SODIUM 50 MG/5ML PO LIQD: 50.0000 mg | Freq: Every day | ORAL | Status: DC

## 2023-06-19 MED ORDER — APIXABAN 5 MG PO TABS
5.0000 mg | ORAL_TABLET | Freq: Two times a day (BID) | ORAL | Status: DC
  Administered 2023-06-19 – 2023-06-22 (×6): 5 mg via ORAL
  Filled 2023-06-19 (×6): qty 1

## 2023-06-19 MED ORDER — LACOSAMIDE 50 MG PO TABS
100.0000 mg | ORAL_TABLET | Freq: Two times a day (BID) | ORAL | Status: DC
  Administered 2023-06-19 – 2023-06-22 (×6): 100 mg via ORAL
  Filled 2023-06-19 (×6): qty 2

## 2023-06-19 MED ORDER — SODIUM CHLORIDE 0.9 % IV BOLUS
500.0000 mL | Freq: Once | INTRAVENOUS | Status: AC
  Administered 2023-06-19: 500 mL via INTRAVENOUS

## 2023-06-19 MED ORDER — LEVETIRACETAM 500 MG PO TABS: 750.0000 mg | ORAL_TABLET | Freq: Two times a day (BID) | ORAL | Status: DC

## 2023-06-19 MED ORDER — SODIUM CHLORIDE 0.9 % IV SOLN
2.0000 g | INTRAVENOUS | Status: DC
  Administered 2023-06-19 – 2023-06-21 (×3): 2 g via INTRAVENOUS
  Filled 2023-06-19 (×2): qty 20

## 2023-06-19 MED ORDER — CLONAZEPAM 0.5 MG PO TABS
0.5000 mg | ORAL_TABLET | Freq: Every day | ORAL | Status: DC
  Administered 2023-06-19 – 2023-06-21 (×3): 0.5 mg via ORAL
  Filled 2023-06-19 (×3): qty 1

## 2023-06-19 MED ORDER — ROSUVASTATIN CALCIUM 20 MG PO TABS
20.0000 mg | ORAL_TABLET | Freq: Every day | ORAL | Status: DC
  Administered 2023-06-20 – 2023-06-22 (×3): 20 mg via ORAL
  Filled 2023-06-19 (×3): qty 1

## 2023-06-19 MED ORDER — SODIUM CHLORIDE 0.9 % IV SOLN
2.0000 g | Freq: Once | INTRAVENOUS | Status: DC
  Filled 2023-06-19: qty 20

## 2023-06-19 MED ORDER — PANTOPRAZOLE SODIUM 40 MG PO TBEC
40.0000 mg | DELAYED_RELEASE_TABLET | Freq: Two times a day (BID) | ORAL | Status: DC
  Administered 2023-06-19 – 2023-06-22 (×6): 40 mg via ORAL
  Filled 2023-06-19 (×6): qty 1

## 2023-06-19 MED ORDER — SERTRALINE HCL 25 MG PO TABS
25.0000 mg | ORAL_TABLET | Freq: Every day | ORAL | Status: DC
  Administered 2023-06-20 – 2023-06-22 (×3): 25 mg via ORAL
  Filled 2023-06-19 (×3): qty 1

## 2023-06-19 MED ORDER — SODIUM CHLORIDE 0.9 % IV SOLN: 2.0000 g | INTRAVENOUS | Status: DC

## 2023-06-19 MED ORDER — BENZONATATE 100 MG PO CAPS: 100.0000 mg | ORAL_CAPSULE | Freq: Two times a day (BID) | ORAL | Status: DC | PRN

## 2023-06-19 MED ORDER — DOCUSATE SODIUM 100 MG PO CAPS
100.0000 mg | ORAL_CAPSULE | Freq: Every day | ORAL | Status: DC
  Administered 2023-06-20 – 2023-06-22 (×3): 100 mg via ORAL
  Filled 2023-06-19 (×3): qty 1

## 2023-06-19 MED ORDER — SODIUM CHLORIDE 0.9 % IV SOLN
100.0000 mg | Freq: Two times a day (BID) | INTRAVENOUS | Status: DC
  Administered 2023-06-19 – 2023-06-21 (×4): 100 mg via INTRAVENOUS
  Filled 2023-06-19 (×5): qty 100

## 2023-06-19 MED ORDER — AMIODARONE HCL 200 MG PO TABS
200.0000 mg | ORAL_TABLET | Freq: Every day | ORAL | Status: DC
  Administered 2023-06-20 – 2023-06-22 (×3): 200 mg via ORAL
  Filled 2023-06-19 (×3): qty 1

## 2023-06-19 MED ORDER — ONDANSETRON HCL 4 MG/2ML IJ SOLN
4.0000 mg | Freq: Four times a day (QID) | INTRAMUSCULAR | Status: DC | PRN
  Administered 2023-06-22: 4 mg via INTRAVENOUS
  Filled 2023-06-19: qty 2

## 2023-06-19 NOTE — Telephone Encounter (Signed)
-   Tara [name] is aware that Dr Lodema Hong recommends pt be taken to the ER to address her symptoms

## 2023-06-19 NOTE — Assessment & Plan Note (Addendum)
Onset in 2023 following resection of meningioma. Looks like pt on Keppra 750 BID + Vimpat (Dose decreased to 100 BID after levels came back very high at Feb Neuro office visit). Cont Vimpat 100 BID Cont Keppra 750 BID Pharm consult to adjust doses for renal fxn ([name]) if needed Looks like the Tara [name] is a PCP prescribed med and not being used to treat seizure disorder.

## 2023-06-19 NOTE — ED Triage Notes (Signed)
Pt tested for covid 2 days ago it was negative

## 2023-06-19 NOTE — ED Provider Notes (Signed)
[place] EMERGENCY DEPARTMENT AT [facility] Provider Note   CSN: [id number] Arrival date & time: 06/19/23  1456     History  Chief Complaint  Patient presents with   Shortness of Breath    AYLINE DINGUS is a 77 y.o. female.   Shortness of Breath Patient presents with cough with some shortness of breath.  Is had for around 2 weeks now.  Has been seen by PCP and had steroids and azithromycin.  Continued cough.  Does have somewhat decreased oral intake.  States she has had clear sputum.  States she has not had an x-ray but did listen to her lungs and told her that she did not have pneumonia.    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   Anxiety    Breast cancer (HCC) 2008   right - s/p lumpectomy->chemo, radiation   Depression    Dysrhythmia    hx SVT   Family history of colon cancer    Family history of prostate cancer    GERD (gastroesophageal reflux disease)    H/O: hysterectomy    Hiatal hernia    History of cancer chemotherapy    History of radiation therapy    Hyperlipidemia    Hypertension 01/25/2018   Nonischemic cardiomyopathy (HCC)    Personal history of radiation therapy 01/05/209   SVT (supraventricular tachycardia)    short RP SVT documented 5/14   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HCC)    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 Authorizing Provider  acetaminophen (TYLENOL) 500 MG tablet Place 500-1,000 mg into feeding tube every 6 (six) hours as needed for mild pain.    [provider]  amiodarone (PACERONE) 200 MG tablet TAKE 1 TABLET VIA G-TUBE ONCE DAILY. 05/29/23   Kerri Perches, MD  benzonatate (TESSALON) 100 MG capsule Take 1 capsule (100 mg total) by mouth 2 (two)  times daily as needed for cough. 06/03/23   Kerri Perches, MD  clonazePAM (KLONOPIN) 0.5 MG tablet TAKE (1) TABLET BY MOUTH AT BEDTIME. 02/02/23   Kerri Perches, MD  clonazePAM (KLONOPIN) 0.5 MG tablet Take 1 tablet (0.5 mg total) by mouth daily. 03/04/23   Kerri Perches, MD  docusate (COLACE) 50 MG/5ML liquid 5 ml via  tube daily 09/29/22   Kerri Perches, MD  ELIQUIS 5 MG TABS tablet TAKE 1 TABLET VIA G-TUBE 2 TIMES DAILY. 05/29/23   Kerri Perches, MD  guaiFENesin-dextromethorphan [facility] DM) 100-10 MG/5ML syrup Half to on teaspoon at bedtime, as nded, for excessive cough 06/17/23   Gilmore Laroche, FNP  hydrocortisone (ANUSOL-HC) 2.5 % rectal cream Place 1 Application rectally 2 (two) times daily. 06/07/23   Kerri Perches, MD  hydrocortisone (ANUSOL-HC) 25 MG suppository Place 1 suppository (25 mg total) rectally 2 (two) times daily. 06/07/23   Kerri Perches, MD  Lacosamide 100 MG TABS TAKE 1 TABLET BYMOUTH IN THE MORNING AND AT BEDTIME 03/04/23   Windell Norfolk, MD  levETIRAcetam (KEPPRA) 750 MG tablet Take 1 tablet (750 mg total) by mouth 2 (two) times daily. 11/13/22   Windell Norfolk, MD  ondansetron (ZOFRAN) 4 MG tablet Take 1 tablet (4 mg total)  by mouth every 8 (eight) hours as needed for nausea or vomiting. 06/17/23   Gilmore Laroche, FNP  pantoprazole (PROTONIX) 40 MG tablet TAKE (1) TABLET BY MOUTH TWICE DAILY. 05/29/23   Kerri Perches, MD  penicillin v potassium (VEETID) 500 MG tablet Take 1 tablet (500 mg total) by mouth 3 (three) times daily. 06/03/23   Kerri Perches, MD  polyethylene glycol powder [facility]) 17 GM/SCOOP powder 17 gm via feeding tube daily 09/29/22   Kerri Perches, MD  Protein (FEEDING SUPPLEMENT, PROSOURCE TF20,) liquid Place 60 mLs into feeding tube daily. 08/22/22   Kerri Perches, MD  rosuvastatin (CRESTOR) 20 MG tablet TAKE ONE TABLET BY MOUTH ONCE DAILY. 05/29/23   Kerri Perches, MD  sertraline (ZOLOFT)  25 MG tablet TAKE 1 TABLET BY MOUTH ONCE A DAY. 05/29/23   Kerri Perches, MD  UNABLE TO FIND Med Name: Prosource-TF 20 Liquid Place 60 MLS into feeding tube daily. 09/05/22   Kerri Perches, MD  UNABLE TO FIND Med Name: Gloves Size:Medium 09/05/22   Kerri Perches, MD  UNABLE TO FIND Med Name: Disposable Bed Pads Size: Bo Merino 09/05/22   Kerri Perches, MD  UNABLE TO FIND Med Name: Adult Pull up Briefs Size : L 09/05/22   Kerri Perches, MD  Water For Irrigation, Sterile (FREE WATER) SOLN Place 100 mLs into feeding tube every 12 (twelve) hours. (Slowly) 08/18/22   Vassie Loll, MD      Allergies    Aspirin, Lisinopril, and Sudafed [pseudoephedrine hcl]    Review of Systems   Review of Systems  Respiratory:  Positive for shortness of breath.     Physical Exam Updated Vital Signs BP (!) 122/90   Pulse 81   Temp 98.1 F (36.7 C) (Oral)   Resp 20   Ht 4\' 9"  (1.448 m)   Wt 73.5 kg   SpO2 99%   BMI 35.06 kg/m  Physical Exam Vitals reviewed.  Cardiovascular:     Rate and Rhythm: Normal rate.  Pulmonary:     Breath sounds: No wheezing or rhonchi.     Comments: Mildly harsh breath sounds without focal rales or rhonchi. Chest:     Chest wall: No tenderness.  Musculoskeletal:     Right lower leg: No tenderness.     Left lower leg: No tenderness.  Skin:    Capillary Refill: Capillary refill takes less than 2 seconds.  Neurological:     Mental Status: She is alert and oriented to person, place, and time.     ED Results / Procedures / Treatments   Labs (all labs ordered are listed, but only abnormal results are displayed) Labs Reviewed  BASIC METABOLIC PANEL - Abnormal; Notable for the following components:      Result Value   CO2 21 (*)    Creatinine, Ser 2.15 (*)    Calcium 8.4 (*)    GFR, Estimated 23 (*)    All other components within normal limits  CBC - Abnormal; Notable for the following components:   Hemoglobin 10.7 (*)    HCT 34.3 (*)     MCH 25.7 (*)    RDW 18.0 (*)    All other components within normal limits  TROPONIN I (HIGH SENSITIVITY)  TROPONIN I (HIGH SENSITIVITY)    EKG None  Radiology DG Chest 2 View  Result Date: 06/19/2023 CLINICAL DATA:  Shortness of breath and productive cough.  Nausea. EXAM: CHEST - 2 VIEW COMPARISON:  06/03/2023  FINDINGS: The cardio pericardial silhouette is enlarged. Patchy nodular airspace disease is seen in the retrocardiac left base, new in the interval. Calcified lymph nodes are seen in the mediastinum and right hilar region with similar right parahilar nodule. No acute bony abnormality. IMPRESSION: New patchy nodular airspace disease in the retrocardiac left base. Imaging features suggest pneumonia. Given the nodular character of this finding, follow-up imaging recommended to ensure resolution. A Electronically Signed   By: Kennith Center M.D.   On: 06/19/2023 17:31    Procedures Procedures    Medications Ordered in ED Medications  sodium chloride 0.9 % bolus 500 mL (has no administration in time range)  cefTRIAXone (ROCEPHIN) 2 g in sodium chloride 0.9 % 100 mL IVPB (has no administration in time range)    ED Course/ Medical Decision Making/ A&P                                 Medical Decision Making  Patient with cough.  Some shortness of breath.  Has had 2 weeks of cough and decreased oral intake.  Differential diagnose includes pneumonia, viral syndrome.  Does have elevated creatinine.  Increased from 2 weeks ago when it was 1.9.  2.15 today.  However 5 months ago was 1.2. Will get chest x-ray.  Will give small fluid bolus to hopefully help with the creatinine.  Xray shows pneumonia. Dyspnea and weakness. Unable to ambulate and has had weakness. Will admit        Final Clinical Impression(s) / ED Diagnoses Final diagnoses:  Community acquired pneumonia of left lung, unspecified part of lung    Rx / DC Orders ED Discharge Orders     None          Benjiman Core, MD 06/19/23 [phone]

## 2023-06-19 NOTE — ED Notes (Signed)
2 IV attempts made by 2 different nurses.

## 2023-06-19 NOTE — Assessment & Plan Note (Addendum)
-   Presumed prerenal - Creatinine 2.15 on admission - Improving some with fluids - need repeat BMP at follow up

## 2023-06-19 NOTE — ED Triage Notes (Signed)
The pt has had some sob and spitting up for 2 weeks she saw her doctor 2 days ago for the same

## 2023-06-19 NOTE — Addendum Note (Signed)
Addended by: Kerri Perches on: 06/19/2023 02:32 PM   Modules accepted: Orders

## 2023-06-19 NOTE — Assessment & Plan Note (Signed)
Cont amiodarone and eliquis

## 2023-06-19 NOTE — Assessment & Plan Note (Addendum)
Retrocardiac nodularity / opacity that could be c/w PNA ? Aspiration PNA again given the history PNA pathway Rocephin + Doxycycline (EDP reports pt failed outpt azithromycin but I just see pen v k prescribed on 9/11?) Urine for s.pneumo COVID, FLU, RSV were neg on 9/11 PT/OT Rads recd follow up CXR following resolution of symptoms given the nodular appearance

## 2023-06-19 NOTE — Assessment & Plan Note (Addendum)
H/o chronic dysphagia and prior aspiration pneumonias.  Looks like she had PEG tube for a while but this had been removed according to last GI note. Looks like question of parkinson's disease by neurologist on their 2/2 note? dont see that this is formally diagnosed nor that she's on PD meds though.  Sounds like may be aspirating on thin liquids once again based on HPI, tolerates solids well. SLP eval and treat and diet rec in AM Thickener PRN if needed Though as I expressed to husband: already concerned about dehydration causing AKI, and thickened liquids taste isnt the greatest.

## 2023-06-19 NOTE — H&P (Addendum)
History and Physical    Patient: Tara [name] KGM:[id number] DOB: [birth year] DOA: 06/19/2023 DOS: the patient was seen and examined on 06/19/2023 PCP: Kerri Perches, MD  Patient coming from: Home  Chief Complaint:  Chief Complaint  Patient presents with   Shortness of Breath   HPI: Tara [name] is a 77 y.o. female with medical history significant of seizure disorder following resection of meningioma in June last year, breast CA in 2008, PAF on eliquis and amiodarone, aspiration PNA x3 fall last year which lead them to making her NPO and putting in a G tube and a J tube (both apparently).  Since then both tubes have been removed and pt eating a mechanical soft diet with significant weight gain.  She developed cough and some SOB for the past 2 weeks now.  Seen by PCP, reportedly treated with steroids and azithromycin (though I see Pen VK prescribed on 9/11 on her med rec?).  CXR in epic on 9/11 was neg for PNA.  COVID, FLU, RSV were neg on 9/11.  Symptoms persisted.  Pt in to ED today.  CXR in ED today is positive for retrocardiac opacity / nodularity.  On further discussion with patient and husband: having coughing / aspiration on liquids that seems to have occurred over past couple of weeks.  Previously had been doing well with diet and liquids.  Now does okay with solids, only thin liquids that she's having trouble with once again (as she was at end of last year causing her 3 admits for aspiration PNA).   Review of Systems: As mentioned in the history of present illness. All other systems reviewed and are negative. 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   Anxiety    Breast cancer (HCC) 2008   right - s/p lumpectomy->chemo, radiation   Depression    Dysrhythmia    hx SVT   Family history of colon cancer    Family history of prostate cancer    GERD (gastroesophageal reflux disease)    H/O:  hysterectomy    Hiatal hernia    History of cancer chemotherapy    History of radiation therapy    Hyperlipidemia    Hypertension 01/25/2018   Nonischemic cardiomyopathy (HCC)    Personal history of radiation therapy 01/05/209   SVT (supraventricular tachycardia)    short RP SVT documented 5/14   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HCC)    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)   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   Past Surgical History:  Procedure Laterality Date   ABDOMINAL HYSTERECTOMY  1994   fibroids,    BIOPSY  06/19/2016   Procedure: BIOPSY;  Surgeon: Corbin Ade, MD;  Location: [facility];  Service: Endoscopy;;  gastric duodenum   BREAST BIOPSY Right 12/16/2006   malignant   BREAST EXCISIONAL BIOPSY Right 2018   benign lumpectomy   BREAST LUMPECTOMY Right 02/2007   BREAST LUMPECTOMY WITH RADIOACTIVE SEED LOCALIZATION Right 07/29/2017   Procedure: RIGHT BREAST LUMPECTOMY WITH RADIOACTIVE SEED LOCALIZATION;  Surgeon: Claud Kelp, MD;  Location: [facility] OR;  Service: General;  Laterality: Right;   BREAST SURGERY Right 2008   lumpectomy, cancer   CARDIAC CATHETERIZATION     CHOLECYSTECTOMY  1999   COLONOSCOPY  2008   Dr. Darrick Penna: multiple polyps. Path not available at time  of visit.    COLONOSCOPY WITH PROPOFOL N/A 04/25/2014   Dr. Darrick Penna: Multiple tubular adenomas removed. Diverticulosis. Moderate internal hemorrhoids. Next colonoscopy planned for August 2018.   CRANIOTOMY Bilateral 03/24/2022   Procedure: Bifrontal craniotomy for resection of meningioma;  Surgeon: Coletta Memos, MD;  Location: [facility] OR;  Service: Neurosurgery;  Laterality: Bilateral;   ESOPHAGOGASTRODUODENOSCOPY (EGD) WITH PROPOFOL N/A 06/19/2016   Procedure: ESOPHAGOGASTRODUODENOSCOPY (EGD) WITH PROPOFOL;  Surgeon: Corbin Ade, MD;  Location: [facility];  Service: Endoscopy;  Laterality: N/A;   MASTECTOMY W/ SENTINEL NODE BIOPSY Right 06/09/2018   Procedure:  RIGHT TOTAL MASTECTOMY WITH SENTINEL LYMPH NODE BIOPSY;  Surgeon: Claud Kelp, MD;  Location: [facility] OR;  Service: General;  Laterality: Right;   POLYPECTOMY N/A 04/25/2014   Procedure: POLYPECTOMY;  Surgeon: West Bali, MD;  Location: [facility];  Service: Endoscopy;  Laterality: N/A;  Ascending and Decending Colon x3 , Transverse colon x2, rectal   PORTACATH PLACEMENT N/A 06/09/2018   Procedure: INSERTION PORT-A-CATH;  Surgeon: Claud Kelp, MD;  Location: [facility] OR;  Service: General;  Laterality: N/A;   RIGHT/LEFT HEART CATH AND CORONARY ANGIOGRAPHY N/A 07/10/2017   Procedure: RIGHT/LEFT HEART CATH AND CORONARY ANGIOGRAPHY;  Surgeon: Laurey Morale, MD;  Location: [facility] CV LAB;  Service: Cardiovascular;  Laterality: N/A;   Social History:  reports that she has never smoked. She has never used smokeless tobacco. She reports that she does not drink alcohol and does not use drugs.  Allergies  Allergen Reactions   Aspirin Other (See Comments)    Reports GI bleed--pt is currently taking   Lisinopril Cough   Sudafed [Pseudoephedrine Hcl]     Pt reports she had drainage in throat that made her throat hurt.     Family History  Problem Relation Age of Onset   Dementia Mother    Stroke Mother 23       left hemiparesis   Colon cancer Father 74       died at age 93   Breast cancer Sister    Hypertension Sister    Heart disease Sister    Cancer Sister        unknown form   Cancer Paternal Aunt        NOS   Other Paternal Aunt        old age   Lung cancer Paternal Uncle    Other Paternal Uncle        lightning strike   Other Paternal Uncle        hit by train   Cancer Cousin        NOS pat first cousin   Cancer Cousin        NOS pat first cousin   Prostate cancer Cousin        pat first cousin   Cancer Brother 79       prostate    Prior to Admission medications   Medication Sig Start Date End Date Taking? Authorizing Provider  acetaminophen (TYLENOL) 500 MG tablet Place  500-1,000 mg into feeding tube every 6 (six) hours as needed for mild pain.    [provider]  amiodarone (PACERONE) 200 MG tablet TAKE 1 TABLET VIA G-TUBE ONCE DAILY. 05/29/23   Kerri Perches, MD  benzonatate (TESSALON) 100 MG capsule Take 1 capsule (100 mg total) by mouth 2 (two) times daily as needed for cough. 06/03/23   Kerri Perches, MD  clonazePAM (KLONOPIN) 0.5 MG tablet TAKE (1) TABLET BY  MOUTH AT BEDTIME. 02/02/23   Kerri Perches, MD  clonazePAM (KLONOPIN) 0.5 MG tablet Take 1 tablet (0.5 mg total) by mouth daily. 03/04/23   Kerri Perches, MD  docusate (COLACE) 50 MG/5ML liquid 5 ml via  tube daily 09/29/22   Kerri Perches, MD  ELIQUIS 5 MG TABS tablet TAKE 1 TABLET VIA G-TUBE 2 TIMES DAILY. 05/29/23   Kerri Perches, MD  guaiFENesin-dextromethorphan [facility] DM) 100-10 MG/5ML syrup Half to on teaspoon at bedtime, as nded, for excessive cough 06/17/23   Gilmore Laroche, FNP  hydrocortisone (ANUSOL-HC) 2.5 % rectal cream Place 1 Application rectally 2 (two) times daily. 06/07/23   Kerri Perches, MD  hydrocortisone (ANUSOL-HC) 25 MG suppository Place 1 suppository (25 mg total) rectally 2 (two) times daily. 06/07/23   Kerri Perches, MD  Lacosamide 100 MG TABS TAKE 1 TABLET BYMOUTH IN THE MORNING AND AT BEDTIME 03/04/23   Windell Norfolk, MD  levETIRAcetam (KEPPRA) 750 MG tablet Take 1 tablet (750 mg total) by mouth 2 (two) times daily. 11/13/22   Windell Norfolk, MD  ondansetron (ZOFRAN) 4 MG tablet Take 1 tablet (4 mg total) by mouth every 8 (eight) hours as needed for nausea or vomiting. 06/17/23   Gilmore Laroche, FNP  pantoprazole (PROTONIX) 40 MG tablet TAKE (1) TABLET BY MOUTH TWICE DAILY. 05/29/23   Kerri Perches, MD  polyethylene glycol powder [facility]) 17 GM/SCOOP powder 17 gm via feeding tube daily 09/29/22   Kerri Perches, MD  Protein (FEEDING SUPPLEMENT, PROSOURCE TF20,) liquid Place 60 mLs into feeding tube daily. 08/22/22    Kerri Perches, MD  rosuvastatin (CRESTOR) 20 MG tablet TAKE ONE TABLET BY MOUTH ONCE DAILY. 05/29/23   Kerri Perches, MD  sertraline (ZOLOFT) 25 MG tablet TAKE 1 TABLET BY MOUTH ONCE A DAY. 05/29/23   Kerri Perches, MD  UNABLE TO FIND Med Name: Prosource-TF 20 Liquid Place 60 MLS into feeding tube daily. 09/05/22   Kerri Perches, MD  UNABLE TO FIND Med Name: Gloves Size:Medium 09/05/22   Kerri Perches, MD  UNABLE TO FIND Med Name: Disposable Bed Pads Size: Bo Merino 09/05/22   Kerri Perches, MD  UNABLE TO FIND Med Name: Adult Pull up Briefs Size : L 09/05/22   Kerri Perches, MD  Water For Irrigation, Sterile (FREE WATER) SOLN Place 100 mLs into feeding tube every 12 (twelve) hours. (Slowly) 08/18/22   Vassie Loll, MD    Physical Exam: Vitals:   06/19/23 1515 06/19/23 1707 06/19/23 1816 06/19/23 1825  BP:   (!) 122/90   Pulse:  67 81   Resp:  (!) 22 20   Temp:  97.6 F (36.4 C) 98.1 F (36.7 C)   TempSrc:   Oral   SpO2:  93% 97% 99%  Weight: 73.5 kg     Height: 4\' 9"  (1.448 m)      Constitutional: NAD, calm, comfortable Respiratory: clear to auscultation bilaterally, no wheezing, no crackles. Normal respiratory effort. No accessory muscle use.  Cardiovascular: Regular rate and rhythm, no murmurs / rubs / gallops. No extremity edema. 2+ pedal pulses. No carotid bruits.  Abdomen: no tenderness, no masses palpated. No hepatosplenomegaly. Bowel sounds positive.  Neurologic: Generalized weakness Psychiatric: Normal judgment and insight. Alert and oriented x 3. Normal mood.   Data Reviewed:    Labs on Admission: I have personally reviewed following labs and imaging studies  CBC: Recent Labs  Lab 06/19/23 1521  WBC 8.1  HGB 10.7*  HCT 34.3*  MCV 82.5  PLT 236   Basic Metabolic Panel: Recent Labs  Lab 06/19/23 1521  NA 140  K 3.7  CL 106  CO2 21*  GLUCOSE 98  BUN 22  CREATININE 2.15*  CALCIUM 8.4*   GFR: Estimated Creatinine  Clearance: 18.2 mL/min (A) (by C-G formula based on SCr of 2.15 mg/dL (H)). Liver Function Tests: No results for input(s): "AST", "ALT", "ALKPHOS", "BILITOT", "PROT", "ALBUMIN" in the last 168 hours. No results for input(s): "LIPASE", "AMYLASE" in the last 168 hours. No results for input(s): "AMMONIA" in the last 168 hours. Coagulation Profile: No results for input(s): "INR", "PROTIME" in the last 168 hours. Cardiac Enzymes: No results for input(s): "CKTOTAL", "CKMB", "CKMBINDEX", "TROPONINI" in the last 168 hours. BNP (last 3 results) No results for input(s): "PROBNP" in the last 8760 hours. HbA1C: No results for input(s): "HGBA1C" in the last 72 hours. CBG: No results for input(s): "GLUCAP" in the last 168 hours. Lipid Profile: No results for input(s): "CHOL", "HDL", "LDLCALC", "TRIG", "CHOLHDL", "LDLDIRECT" in the last 72 hours. Thyroid Function Tests: No results for input(s): "TSH", "T4TOTAL", "FREET4", "T3FREE", "THYROIDAB" in the last 72 hours. Anemia Panel: No results for input(s): "VITAMINB12", "FOLATE", "FERRITIN", "TIBC", "IRON", "RETICCTPCT" in the last 72 hours. Urine analysis:    Component Value Date/Time   COLORURINE YELLOW 04/08/2022 1317   APPEARANCEUR Cloudy (A) 06/03/2023 0000   LABSPEC 1.017 04/08/2022 1317   PHURINE 7.0 04/08/2022 1317   GLUCOSEU Negative 06/03/2023 0000   HGBUR NEGATIVE 04/08/2022 1317   BILIRUBINUR 1+ (A) 06/03/2023 0000   KETONESUR NEGATIVE 04/08/2022 1317   PROTEINUR 2+ (A) 06/03/2023 0000   PROTEINUR 100 (A) 04/08/2022 1317   UROBILINOGEN 0.2 02/07/2013 1859   NITRITE Negative 06/03/2023 0000   NITRITE NEGATIVE 04/08/2022 1317   LEUKOCYTESUR Negative 06/03/2023 0000   LEUKOCYTESUR NEGATIVE 04/08/2022 1317    Radiological Exams on Admission: DG Chest 2 View  Result Date: 06/19/2023 CLINICAL DATA:  Shortness of breath and productive cough.  Nausea. EXAM: CHEST - 2 VIEW COMPARISON:  06/03/2023 FINDINGS: The cardio pericardial  silhouette is enlarged. Patchy nodular airspace disease is seen in the retrocardiac left base, new in the interval. Calcified lymph nodes are seen in the mediastinum and right hilar region with similar right parahilar nodule. No acute bony abnormality. IMPRESSION: New patchy nodular airspace disease in the retrocardiac left base. Imaging features suggest pneumonia. Given the nodular character of this finding, follow-up imaging recommended to ensure resolution. A Electronically Signed   By: Kennith Center M.D.   On: 06/19/2023 17:31    EKG: Independently reviewed.   Assessment and Plan: * AKI (acute kidney injury) (HCC) ? Pre renal / ATN in setting of acute illness (PNA), h/o same during prior admits it looks like IVF Strict intake and output Repeat BMP in AM Renal US if not rapidly improving  Community acquired pneumonia of left lower lobe of lung Retrocardiac nodularity / opacity that could be c/w PNA ? Aspiration PNA again given the history PNA pathway Rocephin + Doxycycline (EDP reports pt failed outpt azithromycin but I just see pen v k prescribed on 9/11?) Urine for s.pneumo COVID, FLU, RSV were neg on 9/11 PT/OT Rads recd follow up CXR following resolution of symptoms given the nodular appearance  Dysphagia H/o chronic dysphagia and prior aspiration pneumonias.  Looks like she had PEG tube for a while but this had been removed according to last GI note. Looks like question of parkinson's disease  by neurologist on their 2/2 note? dont see that this is formally diagnosed nor that she's on PD meds though.  Sounds like may be aspirating on thin liquids once again based on HPI, tolerates solids well. SLP eval and treat and diet rec in AM Thickener PRN if needed Though as I expressed to husband: already concerned about dehydration causing AKI, and thickened liquids taste isnt the greatest.  Paroxysmal atrial fibrillation (HCC) Cont amiodarone and eliquis.  Seizure disorder  (HCC) Onset in 2023 following resection of meningioma. Looks like pt on Keppra 750 BID + Vimpat (Dose decreased to 100 BID after levels came back very high at Feb Neuro office visit). Cont Vimpat 100 BID Cont Keppra 750 BID Pharm consult to adjust doses for renal fxn ([name]) if needed Looks like the Jaynie Bream is a PCP prescribed med and not being used to treat seizure disorder.  Chronic HFrEF (heart failure with reduced ejection fraction) (HCC) EF 40% Watch for development of volume overload with IVF being used to treat her AKI.      Advance Care Planning:   Code Status: Do not attempt resuscitation (DNR) PRE-ARREST INTERVENTIONS DESIRED Confirmed with pt and husband  Consults: None  Family Communication: Husband at bedside  Severity of Illness: The appropriate patient status for this patient is OBSERVATION. Observation status is judged to be reasonable and necessary in order to provide the required intensity of service to ensure the patient's safety. The patient's presenting symptoms, physical exam findings, and initial radiographic and laboratory data in the context of their medical condition is felt to place them at decreased risk for further clinical deterioration. Furthermore, it is anticipated that the patient will be medically stable for discharge from the hospital within 2 midnights of admission.   Author: Hillary Bow., DO 06/19/2023 8:21 PM  For on call review [email or web address].

## 2023-06-19 NOTE — Assessment & Plan Note (Addendum)
EF 40% Watch for development of volume overload with IVF being used to treat her AKI.

## 2023-06-19 NOTE — ED Notes (Signed)
ED TO INPATIENT HANDOFF REPORT  ED Nurse Name and Phone #: Scheryl Marten RN, [phone]  S Name/Age/Gender Tara [name] 77 y.o. female Room/Bed: 020C/020C  Code Status   Code Status: Do not attempt resuscitation (DNR) PRE-ARREST INTERVENTIONS DESIRED  Home/SNF/Other Home Patient oriented to: self, place, time, and situation Is this baseline? Yes   Triage Complete: Triage complete  Chief Complaint Community acquired pneumonia of left lower lobe of lung [J18.9]  Triage Note The pt has had some sob and spitting up for 2 weeks she saw her doctor 2 days ago for the same  Pt tested for covid 2 days ago it was negative   Allergies Allergies  Allergen Reactions   Aspirin Other (See Comments)    Reports GI bleed--pt is currently taking   Lisinopril Cough   Sudafed [Pseudoephedrine Hcl]     Pt reports she had drainage in throat that made her throat hurt.     Level of Care/Admitting Diagnosis ED Disposition     ED Disposition  Admit   Condition  --   Comment  Hospital Area: [facility] [[id number]]  Level of Care: Med-Surg [16]  May place patient in observation at [facility] or [facility] if equivalent level of care is available:: No  Covid Evaluation: Asymptomatic - no recent exposure (last 10 days) testing not required  Diagnosis: Community acquired pneumonia of left lower lobe of lung [[id number]]  Admitting Physician: Hillary Bow [[id number]]  Attending Physician: Hillary Bow [[id number]]          B Medical/Surgery History 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   Anxiety    Breast cancer (HCC) 2008   right - s/p lumpectomy->chemo, radiation   Depression    Dysrhythmia    hx SVT   Family history of colon cancer    Family history of prostate cancer    GERD (gastroesophageal reflux disease)    H/O: hysterectomy    Hiatal hernia    History of cancer chemotherapy     History of radiation therapy    Hyperlipidemia    Hypertension 01/25/2018   Nonischemic cardiomyopathy (HCC)    Personal history of radiation therapy 01/05/209   SVT (supraventricular tachycardia)    short RP SVT documented 5/14   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HCC)    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)   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   Past Surgical History:  Procedure Laterality Date   ABDOMINAL HYSTERECTOMY  1994   fibroids,    BIOPSY  06/19/2016   Procedure: BIOPSY;  Surgeon: Corbin Ade, MD;  [facility];  Service: Endoscopy;;  gastric duodenum   BREAST BIOPSY Right 12/16/2006   malignant   BREAST EXCISIONAL BIOPSY Right 2018   benign lumpectomy   BREAST LUMPECTOMY Right 02/2007   BREAST LUMPECTOMY WITH RADIOACTIVE SEED LOCALIZATION Right 07/29/2017   Procedure: RIGHT BREAST LUMPECTOMY WITH RADIOACTIVE SEED LOCALIZATION;  Surgeon: Claud Kelp, MD;  Location: [facility] OR;  Service: General;  Laterality: Right;   BREAST SURGERY Right 2008   lumpectomy, cancer   CARDIAC CATHETERIZATION     CHOLECYSTECTOMY  1999   COLONOSCOPY  2008   Dr. Darrick Penna: multiple polyps. Path not available at time of visit.    COLONOSCOPY WITH PROPOFOL N/A 04/25/2014   Dr. Darrick Penna: Multiple tubular  adenomas removed. Diverticulosis. Moderate internal hemorrhoids. Next colonoscopy planned for August 2018.   CRANIOTOMY Bilateral 03/24/2022   Procedure: Bifrontal craniotomy for resection of meningioma;  Surgeon: Coletta Memos, MD;  Location: [facility] OR;  Service: Neurosurgery;  Laterality: Bilateral;   ESOPHAGOGASTRODUODENOSCOPY (EGD) WITH PROPOFOL N/A 06/19/2016   Procedure: ESOPHAGOGASTRODUODENOSCOPY (EGD) WITH PROPOFOL;  Surgeon: Corbin Ade, MD;  [facility];  Service: Endoscopy;  Laterality: N/A;   MASTECTOMY W/ SENTINEL NODE BIOPSY Right 06/09/2018   Procedure: RIGHT TOTAL MASTECTOMY WITH SENTINEL LYMPH NODE BIOPSY;  Surgeon:  Claud Kelp, MD;  Location: [facility] OR;  Service: General;  Laterality: Right;   POLYPECTOMY N/A 04/25/2014   Procedure: POLYPECTOMY;  Surgeon: West Bali, MD;  Location: [facility] ORS;  Service: Endoscopy;  Laterality: N/A;  Ascending and Decending Colon x3 , Transverse colon x2, rectal   PORTACATH PLACEMENT N/A 06/09/2018   Procedure: INSERTION PORT-A-CATH;  Surgeon: Claud Kelp, MD;  Location: [facility] OR;  Service: General;  Laterality: N/A;   RIGHT/LEFT HEART CATH AND CORONARY ANGIOGRAPHY N/A 07/10/2017   Procedure: RIGHT/LEFT HEART CATH AND CORONARY ANGIOGRAPHY;  Surgeon: Laurey Morale, MD;  Location: [facility] INVASIVE CV LAB;  Service: Cardiovascular;  Laterality: N/A;     A IV Location/Drains/Wounds Patient Lines/Drains/Airways Status     Active Line/Drains/Airways     Name Placement date Placement time Site Days   Gastrostomy/Enterostomy PEG-jejunostomy LLQ --  --  LLQ  --   Pressure Injury 04/07/22 Buttocks Left Stage 2 -  Partial thickness loss of dermis presenting as a shallow open injury with a red, pink wound bed without slough. 04/07/22  0100  -- 438            Intake/Output Last 24 hours No intake or output data in the 24 hours ending 06/19/23 2004  Labs/Imaging Results for orders placed or performed during the hospital encounter of 06/19/23 (from the past 48 hour(s))  Basic metabolic panel     Status: Abnormal   Collection Time: 06/19/23  3:21 PM  Result Value Ref Range   Sodium 140 135 - 145 mmol/L   Potassium 3.7 3.5 - 5.1 mmol/L   Chloride 106 98 - 111 mmol/L   CO2 21 (L) 22 - 32 mmol/L   Glucose, Bld 98 70 - 99 mg/dL    Comment: Glucose reference range applies only to samples taken after fasting for at least 8 hours.   BUN 22 8 - 23 mg/dL   Creatinine, Ser 5.78 (H) 0.44 - 1.00 mg/dL   Calcium 8.4 (L) 8.9 - 10.3 mg/dL   GFR, Estimated 23 (L) >60 mL/min    Comment: (NOTE) Calculated using the CKD-EPI Creatinine Equation (2021)    Anion gap 13 5 - 15    Comment:  Performed at [facility] Lab, [address] [place], [place] [address]  CBC     Status: Abnormal   Collection Time: 06/19/23  3:21 PM  Result Value Ref Range   WBC 8.1 4.0 - 10.5 K/uL   RBC 4.16 3.87 - 5.11 MIL/uL   Hemoglobin 10.7 (L) 12.0 - 15.0 g/dL   HCT 95.2 (L) 84.1 - 32.4 %   MCV 82.5 80.0 - 100.0 fL   MCH 25.7 (L) 26.0 - 34.0 pg   MCHC 31.2 30.0 - 36.0 g/dL   RDW 40.1 (H) 02.7 - 25.3 %   Platelets 236 150 - 400 K/uL   nRBC 0.0 0.0 - 0.2 %    Comment: Performed at [facility], [address] [place], [place] [address]  Troponin I (High Sensitivity)     Status: None   Collection Time: 06/19/23  3:21 PM  Result Value Ref Range   Troponin I (High Sensitivity) 13 <18 ng/L    Comment: (NOTE) Elevated high sensitivity troponin I (hsTnI) values and significant  changes across serial measurements may suggest ACS but many other  chronic and acute conditions are known to elevate hsTnI results.  Refer to the "Links" section for chest pain algorithms and additional  guidance. Performed at [facility], [address] [place], [place] [address]    DG Chest 2 View  Result Date: 06/19/2023 CLINICAL DATA:  Shortness of breath and productive cough.  Nausea. EXAM: CHEST - 2 VIEW COMPARISON:  06/03/2023 FINDINGS: The cardio pericardial silhouette is enlarged. Patchy nodular airspace disease is seen in the retrocardiac left base, new in the interval. Calcified lymph nodes are seen in the mediastinum and right hilar region with similar right parahilar nodule. No acute bony abnormality. IMPRESSION: New patchy nodular airspace disease in the retrocardiac left base. Imaging features suggest pneumonia. Given the nodular character of this finding, follow-up imaging recommended to ensure resolution. A Electronically Signed   By: Kennith Center M.D.   On: 06/19/2023 17:31    Pending Labs Unresulted Labs (From admission, onward)     Start     Ordered   06/20/23 0500  CBC  Tomorrow  morning,   R        06/19/23 1952   06/20/23 0500  Basic metabolic panel  Tomorrow morning,   R        06/19/23 1952   06/19/23 1951  Strep pneumoniae urinary antigen  Once,   R        06/19/23 1952   06/19/23 1948  HIV Antibody (routine testing w rflx)  (HIV Antibody (Routine testing w reflex) panel)  Once,   R        06/19/23 1952            Vitals/Pain Today's Vitals   06/19/23 1515 06/19/23 1707 06/19/23 1816 06/19/23 1825  BP:   (!) 122/90   Pulse:  67 81   Resp:  (!) 22 20   Temp:  97.6 F (36.4 C) 98.1 F (36.7 C)   TempSrc:   Oral   SpO2:  93% 97% 99%  Weight: 73.5 kg     Height: 4\' 9"  (1.448 m)     PainSc:        Isolation Precautions No active isolations  Medications Medications  sodium chloride 0.9 % bolus 500 mL (has no administration in time range)  cefTRIAXone (ROCEPHIN) 2 g in sodium chloride 0.9 % 100 mL IVPB (has no administration in time range)  lactated ringers infusion (has no administration in time range)  cefTRIAXone (ROCEPHIN) 2 g in sodium chloride 0.9 % 100 mL IVPB (has no administration in time range)  doxycycline (VIBRAMYCIN) 100 mg in sodium chloride 0.9 % 250 mL IVPB (has no administration in time range)  apixaban (ELIQUIS) tablet 5 mg (has no administration in time range)  amiodarone (PACERONE) tablet 200 mg (has no administration in time range)  levETIRAcetam (KEPPRA) tablet 750 mg (has no administration in time range)    Mobility non-ambulatory     Focused Assessments Pulmonary Assessment Handoff:  Lung sounds:   O2 Device: Room Air      R Recommendations: See Admitting Provider Note  Report given to:  Additional Notes: Major difficulty getting access of pt. Has had nearly 5 PIV attempts. IV team consulted, unable to start any IV medications until then

## 2023-06-20 ENCOUNTER — Observation Stay (HOSPITAL_COMMUNITY): Payer: Medicare Other

## 2023-06-20 DIAGNOSIS — Z800 Family history of malignant neoplasm of digestive organs: Secondary | ICD-10-CM | POA: Diagnosis not present

## 2023-06-20 DIAGNOSIS — R1310 Dysphagia, unspecified: Secondary | ICD-10-CM | POA: Diagnosis present

## 2023-06-20 DIAGNOSIS — E785 Hyperlipidemia, unspecified: Secondary | ICD-10-CM | POA: Diagnosis present

## 2023-06-20 DIAGNOSIS — Z803 Family history of malignant neoplasm of breast: Secondary | ICD-10-CM | POA: Diagnosis not present

## 2023-06-20 DIAGNOSIS — Z8249 Family history of ischemic heart disease and other diseases of the circulatory system: Secondary | ICD-10-CM | POA: Diagnosis not present

## 2023-06-20 DIAGNOSIS — I428 Other cardiomyopathies: Secondary | ICD-10-CM | POA: Diagnosis present

## 2023-06-20 DIAGNOSIS — Z66 Do not resuscitate: Secondary | ICD-10-CM | POA: Diagnosis present

## 2023-06-20 DIAGNOSIS — Z90710 Acquired absence of both cervix and uterus: Secondary | ICD-10-CM | POA: Diagnosis not present

## 2023-06-20 DIAGNOSIS — Z79899 Other long term (current) drug therapy: Secondary | ICD-10-CM | POA: Diagnosis not present

## 2023-06-20 DIAGNOSIS — F32A Depression, unspecified: Secondary | ICD-10-CM | POA: Diagnosis present

## 2023-06-20 DIAGNOSIS — G40909 Epilepsy, unspecified, not intractable, without status epilepticus: Secondary | ICD-10-CM | POA: Diagnosis present

## 2023-06-20 DIAGNOSIS — Z1152 Encounter for screening for COVID-19: Secondary | ICD-10-CM | POA: Diagnosis not present

## 2023-06-20 DIAGNOSIS — Z923 Personal history of irradiation: Secondary | ICD-10-CM | POA: Diagnosis not present

## 2023-06-20 DIAGNOSIS — Z9221 Personal history of antineoplastic chemotherapy: Secondary | ICD-10-CM | POA: Diagnosis not present

## 2023-06-20 DIAGNOSIS — Z853 Personal history of malignant neoplasm of breast: Secondary | ICD-10-CM | POA: Diagnosis not present

## 2023-06-20 DIAGNOSIS — J189 Pneumonia, unspecified organism: Secondary | ICD-10-CM | POA: Diagnosis not present

## 2023-06-20 DIAGNOSIS — Z86011 Personal history of benign neoplasm of the brain: Secondary | ICD-10-CM | POA: Diagnosis not present

## 2023-06-20 DIAGNOSIS — I110 Hypertensive heart disease with heart failure: Secondary | ICD-10-CM | POA: Diagnosis present

## 2023-06-20 DIAGNOSIS — Z886 Allergy status to analgesic agent status: Secondary | ICD-10-CM | POA: Diagnosis not present

## 2023-06-20 DIAGNOSIS — R0902 Hypoxemia: Secondary | ICD-10-CM | POA: Diagnosis not present

## 2023-06-20 DIAGNOSIS — I5022 Chronic systolic (congestive) heart failure: Secondary | ICD-10-CM | POA: Diagnosis present

## 2023-06-20 DIAGNOSIS — Z9049 Acquired absence of other specified parts of digestive tract: Secondary | ICD-10-CM | POA: Diagnosis not present

## 2023-06-20 DIAGNOSIS — T17908A Unspecified foreign body in respiratory tract, part unspecified causing other injury, initial encounter: Secondary | ICD-10-CM | POA: Diagnosis present

## 2023-06-20 DIAGNOSIS — I480 Paroxysmal atrial fibrillation: Secondary | ICD-10-CM | POA: Diagnosis present

## 2023-06-20 DIAGNOSIS — N179 Acute kidney failure, unspecified: Secondary | ICD-10-CM | POA: Diagnosis not present

## 2023-06-20 DIAGNOSIS — R1312 Dysphagia, oropharyngeal phase: Secondary | ICD-10-CM | POA: Diagnosis not present

## 2023-06-20 DIAGNOSIS — Z7901 Long term (current) use of anticoagulants: Secondary | ICD-10-CM | POA: Diagnosis not present

## 2023-06-20 LAB — BASIC METABOLIC PANEL
Anion gap: 9 (ref 5–15)
BUN: 21 mg/dL (ref 8–23)
CO2: 19 mmol/L — ABNORMAL LOW (ref 22–32)
Calcium: 7.9 mg/dL — ABNORMAL LOW (ref 8.9–10.3)
Chloride: 112 mmol/L — ABNORMAL HIGH (ref 98–111)
Creatinine, Ser: 1.92 mg/dL — ABNORMAL HIGH (ref 0.44–1.00)
GFR, Estimated: 27 mL/min — ABNORMAL LOW (ref >60)
Glucose, Bld: 97 mg/dL (ref 70–99)
Potassium: 3.5 mmol/L (ref 3.5–5.1)
Sodium: 140 mmol/L (ref 135–145)

## 2023-06-20 LAB — CBC
HCT: 33.4 % — ABNORMAL LOW (ref 36.0–46.0)
Hemoglobin: 10.4 g/dL — ABNORMAL LOW (ref 12.0–15.0)
MCH: 24.8 pg — ABNORMAL LOW (ref 26.0–34.0)
MCHC: 31.1 g/dL (ref 30.0–36.0)
MCV: 79.7 fL — ABNORMAL LOW (ref 80.0–100.0)
Platelets: 202 10*3/uL (ref 150–400)
RBC: 4.19 MIL/uL (ref 3.87–5.11)
RDW: 18.1 % — ABNORMAL HIGH (ref 11.5–15.5)
WBC: 6.8 10*3/uL (ref 4.0–10.5)
nRBC: 0 % (ref 0.0–0.2)

## 2023-06-20 NOTE — Evaluation (Signed)
Physical Therapy Evaluation Patient Details Name: Tara [name] MRN: [id number] DOB: 01-May-1946 Today's Date: 06/20/2023  History of Present Illness  77 y.o. female presenting 9/27 with cough and SHOB. CXR positive for retrocardiac opacity / nodularity. Workup for AKI, CAP. PMH: aspiration PNA, a fib,  seizure disorder s/p resection of meningioma 2023, breast cancer.  Clinical Impression   Pt presents with generalized weakness, min impaired coordination, impaired balance, poor activity tolerance vs baseline, and dyspnea on exertion with SPO2 88-95% on RA (reapplied 2LO2 at PT exit). Pt to benefit from acute PT to address deficits. Pt ambulated short room distance with use of rollator and light steadying assist, pt endorsing significant fatigue post-session. Pt would benefit from [facility] services at d/c, will have assist from husband for ADLs and mobility. PT to progress mobility as tolerated, and will continue to follow acutely.          If plan is discharge home, recommend the following: A little help with walking and/or transfers;A little help with bathing/dressing/bathroom   Can travel by private vehicle        Equipment Recommendations None recommended by PT  Recommendations for Other Services       Functional Status Assessment Patient has had a recent decline in their functional status and demonstrates the ability to make significant improvements in function in a reasonable and predictable amount of time.     Precautions / Restrictions Precautions Precautions: Fall Precaution Comments: on 2LO2 presently, SPO2 88-95% on RA throughout session but 2LO2 reapplied upon PT exit Restrictions Weight Bearing Restrictions: No      Mobility  Bed Mobility Overal bed mobility: Needs Assistance Bed Mobility: Supine to Sit, Sit to Supine     Supine to sit: Min assist, HOB elevated Sit to supine: Mod assist, HOB elevated   General bed mobility comments: assist for trunk and LE  management, boost up in bed. increased time, step-by-step sequencing cues    Transfers Overall transfer level: Needs assistance Equipment used: Rolling walker (2 wheels), Rollator (4 wheels) Transfers: Sit to/from Stand Sit to Stand: Min assist           General transfer comment: light rise and steady assist, stand x2 from EOB with RW and rollator (RW too tall for pt so rollator utilized for more appropriate height)    Ambulation/Gait Ambulation/Gait assistance: Min Chemical engineer (Feet): 20 Feet Assistive device: Rollator (4 wheels) Gait Pattern/deviations: Step-through pattern, Decreased stride length, Trunk flexed, Leaning posteriorly Gait velocity: decr     General Gait Details: assist to steady and correct posterior bias, especially with directional cahnges. Spo25min on RA 88% with DOE 2/4  Stairs            Wheelchair Mobility     Tilt Bed    Modified Rankin (Stroke Patients Only)       Balance Overall balance assessment: Needs assistance Sitting-balance support: No upper extremity supported, Feet supported Sitting balance-Leahy Scale: Fair     Standing balance support: Bilateral upper extremity supported, During functional activity Standing balance-Leahy Scale: Poor Standing balance comment: reliant on AD                             Pertinent Vitals/Pain Pain Assessment Pain Assessment: No/denies pain    Home Living Family/patient expects to be discharged to:: Private residence Living Arrangements: Spouse/significant other Available Help at Discharge: Family;Available PRN/intermittently Type of Home: House Home Access: Ramped entrance  Home Layout: Able to live on main level with bedroom/bathroom Home Equipment: Rolling Walker (2 wheels);Rollator (4 wheels);BSC/3in1;Wheelchair - manual;Hospital bed      Prior Function Prior Level of Function : Needs assist             Mobility Comments: uses RW for gait,  working towards no AD prior to "going downhill" starting around labor day ADLs Comments: assist from husband for all ADLs     Extremity/Trunk Assessment   Upper Extremity Assessment Upper Extremity Assessment: Defer to OT evaluation (tremors)    Lower Extremity Assessment Lower Extremity Assessment: Generalized weakness    Cervical / Trunk Assessment Cervical / Trunk Assessment: Kyphotic  Communication   Communication Communication: No apparent difficulties Cueing Techniques: Gestural cues;Verbal cues  Cognition Arousal: Alert Behavior During Therapy: Flat affect   Area of Impairment: Attention, Following commands, Safety/judgement, Problem solving                   Current Attention Level: Sustained   Following Commands: Follows one step commands with increased time     Problem Solving: Slow processing, Decreased initiation, Difficulty sequencing, Requires verbal cues, Requires tactile cues General Comments: very slowed processing, unsure of pt baseline        General Comments      Exercises     Assessment/Plan    PT Assessment Patient needs continued PT services  PT Problem List Decreased strength;Decreased mobility;Decreased activity tolerance;Decreased balance;Decreased knowledge of use of DME;Pain;Decreased cognition;Cardiopulmonary status limiting activity       PT Treatment Interventions DME instruction;Therapeutic activities;Gait training;Therapeutic exercise;Patient/family education;Balance training;Functional mobility training;Neuromuscular re-education    PT Goals (Current goals can be found in the Care Plan section)  Acute Rehab PT Goals Patient Stated Goal: home PT Goal Formulation: With patient/family Time For Goal Achievement: 07/04/23 Potential to Achieve Goals: Good    Frequency Min 1X/week     Co-evaluation               AM-PAC PT "6 Clicks" Mobility  Outcome Measure Help needed turning from your back to your side while  in a flat bed without using bedrails?: A Little Help needed moving from lying on your back to sitting on the side of a flat bed without using bedrails?: A Little Help needed moving to and from a bed to a chair (including a wheelchair)?: A Little Help needed standing up from a chair using your arms (e.g., wheelchair or bedside chair)?: A Little Help needed to walk in hospital room?: A Little Help needed climbing 3-5 steps with a railing? : A Lot 6 Click Score: 17    End of Session Equipment Utilized During Treatment: Gait belt Activity Tolerance: Patient tolerated treatment well Patient left: with call bell/phone within reach;in bed;with bed alarm set;with family/visitor present Nurse Communication: Mobility status PT Visit Diagnosis: Other abnormalities of gait and mobility (R26.89);Difficulty in walking, not elsewhere classified (R26.2);Muscle weakness (generalized) (M62.81)    Time: 0930-1000 PT Time Calculation (min) (ACUTE ONLY): 30 min   Charges:   PT Evaluation $PT Eval Low Complexity: 1 Low PT Treatments $Therapeutic Activity: 8-22 mins PT General Charges $$ ACUTE PT VISIT: 1 Visit         Marye Round, PT DPT Acute Rehabilitation Services Secure Chat Preferred  Office [phone]   Leasia Swann E Christain [place] 06/20/2023, 10:17 AM

## 2023-06-20 NOTE — Progress Notes (Signed)
Progress Note    Tara DESAULNIERS   UJW:[id number]  DOB: November 16, 1945  DOA: 06/19/2023     0 PCP: Kerri Perches, MD  Initial CC: cough  Hospital Course: Tara [name] is a 77 y.o. female with medical history significant of seizure disorder following resection of meningioma in June 2023, breast CA in 2008, PAF on eliquis and amiodarone, aspiration PNA x3 with history of tube feeds.  She has since been transitioned off tube feeds and started back on dysphagia diet's.   She developed cough and some SOB for the past 2 weeks now.  She was on outpatient antibiotics but presented due to persistence of symptoms.  CXR in epic on 9/11 was neg for PNA.  COVID, FLU, RSV were neg on 9/11.   CXR on admission was concerning for a small left retrocardiac opacity.  She was started on antibiotics due to concern for aspiration and SLP was consulted.  Interval History:  Seen this afternoon after returning from a swallow study.  Resting comfortably with no concerns when seen.  Has been swallowing okay on dysphagia diet so far today.  Assessment and Plan: * AKI (acute kidney injury) (HCC) - Presumed prerenal - Creatinine 2.15 on admission - Improving some with fluids - Continue fluids and repeat BMP in a.m.  Community acquired pneumonia of left lower lobe of lung Retrocardiac nodularity / opacity that could be c/w PNA and/or aspiration - continue rocephin and doxy - COVID, FLU, RSV were neg on 9/11  Dysphagia - H/o chronic dysphagia and prior aspiration pneumonias.  S/p PEG in past too - appreciate SLP eval. patient also underwent swallow study during this admission - No aspiration noted but recommended to continue on dysphagia 3 diet  Paroxysmal atrial fibrillation (HCC) Cont amiodarone and eliquis.  Seizure disorder (HCC) Onset in 2023 following resection of meningioma. Looks like pt on Keppra 750 BID + Vimpat (Dose decreased to 100 BID after levels came back very high at Feb Neuro office  visit). Cont Vimpat 100 BID Cont Keppra 750 BID Pharm consult to adjust doses for renal fxn ([name]) if needed Looks like the Jaynie Bream is a PCP prescribed med and not being used to treat seizure disorder.  Chronic HFrEF (heart failure with reduced ejection fraction) (HCC) EF 40% Watch for development of volume overload with IVF being used to treat her AKI.    Old records reviewed in assessment of this patient  Antimicrobials: Rocephin 06/19/2023 >> current Doxycycline 06/19/2023 >> current  DVT prophylaxis:   apixaban (ELIQUIS) tablet 5 mg   Code Status:   Code Status: Do not attempt resuscitation (DNR) PRE-ARREST INTERVENTIONS DESIRED  Mobility Assessment (Last 72 Hours)     Mobility Assessment     Row Name 06/20/23 1200 06/20/23 1014 06/19/23 2324       Does patient have an order for bedrest or is patient medically unstable -- -- No - Continue assessment     What is the highest level of mobility based on the progressive mobility assessment? Level 4 (Walks with assist in room) - Balance while marching in place and cannot step forward and back - Complete Level 4 (Walks with assist in room) - Balance while marching in place and cannot step forward and back - Complete Level 4 (Walks with assist in room) - Balance while marching in place and cannot step forward and back - Complete              Barriers to discharge: none  Disposition Plan:  Home Status is: Inpt  Objective: Blood pressure 99/88, pulse 77, temperature (!) 97.5 F (36.4 C), temperature source Oral, resp. rate 17, height 4\' 9"  (1.448 m), weight 73.5 kg, SpO2 98%.  Examination:  Physical Exam Constitutional:      Appearance: Normal appearance.  HENT:     Head: Normocephalic and atraumatic.     Mouth/Throat:     Mouth: Mucous membranes are moist.  Eyes:     Extraocular Movements: Extraocular movements intact.  Cardiovascular:     Rate and Rhythm: Normal rate and regular rhythm.  Pulmonary:      Effort: Pulmonary effort is normal. No respiratory distress.     Breath sounds: Normal breath sounds. No wheezing.  Abdominal:     General: Bowel sounds are normal. There is no distension.     Palpations: Abdomen is soft.     Tenderness: There is no abdominal tenderness.  Musculoskeletal:        General: Normal range of motion.     Cervical back: Normal range of motion and neck supple.  Skin:    General: Skin is warm and dry.  Neurological:     General: No focal deficit present.     Mental Status: She is alert.  Psychiatric:        Mood and Affect: Mood normal.      Consultants:    Procedures:    Data Reviewed: Results for orders placed or performed during the hospital encounter of 06/19/23 (from the past 24 hour(s))  HIV Antibody (routine testing w rflx)     Status: None   Collection Time: 06/19/23  8:27 PM  Result Value Ref Range   HIV Screen 4th Generation wRfx Non Reactive Non Reactive  SARS Coronavirus 2 by RT PCR (hospital order, performed in [facility] hospital lab) *cepheid single result test* Anterior Nasal Swab     Status: None   Collection Time: 06/19/23  8:58 PM   Specimen: Anterior Nasal Swab  Result Value Ref Range   SARS Coronavirus 2 by RT PCR NEGATIVE NEGATIVE  Troponin I (High Sensitivity)     Status: None   Collection Time: 06/19/23  9:55 PM  Result Value Ref Range   Troponin I (High Sensitivity) 9 <18 ng/L  CBC     Status: Abnormal   Collection Time: 06/20/23  9:14 AM  Result Value Ref Range   WBC 6.8 4.0 - 10.5 K/uL   RBC 4.19 3.87 - 5.11 MIL/uL   Hemoglobin 10.4 (L) 12.0 - 15.0 g/dL   HCT 16.1 (L) 09.6 - 04.5 %   MCV 79.7 (L) 80.0 - 100.0 fL   MCH 24.8 (L) 26.0 - 34.0 pg   MCHC 31.1 30.0 - 36.0 g/dL   RDW 40.9 (H) 81.1 - 91.4 %   Platelets 202 150 - 400 K/uL   nRBC 0.0 0.0 - 0.2 %  Basic metabolic panel     Status: Abnormal   Collection Time: 06/20/23  9:14 AM  Result Value Ref Range   Sodium 140 135 - 145 mmol/L   Potassium 3.5 3.5 -  5.1 mmol/L   Chloride 112 (H) 98 - 111 mmol/L   CO2 19 (L) 22 - 32 mmol/L   Glucose, Bld 97 70 - 99 mg/dL   BUN 21 8 - 23 mg/dL   Creatinine, Ser 7.82 (H) 0.44 - 1.00 mg/dL   Calcium 7.9 (L) 8.9 - 10.3 mg/dL   GFR, Estimated 27 (L) >60 mL/min   Anion  gap 9 5 - 15    I have reviewed pertinent nursing notes, vitals, labs, and images as necessary. I have ordered labwork to follow up on as indicated.  I have reviewed the last notes from staff over past 24 hours. I have discussed patient's care plan and test results with nursing staff, CM/SW, and other staff as appropriate.  Time spent: Greater than 50% of the 55 minute visit was spent in counseling/coordination of care for the patient as laid out in the A&P.   LOS: 0 days   Lewie Chamber, MD [facility] 06/20/2023, 3:59 PM

## 2023-06-20 NOTE — Evaluation (Signed)
Occupational Therapy Evaluation Patient Details Name: Tara [name] MRN: [id number] DOB: 02/10/1946 Today's Date: 06/20/2023   History of Present Illness 77 y.o. female presenting 9/27 with cough and SHOB. CXR positive for retrocardiac opacity / nodularity. Workup for AKI, CAP. PMH: aspiration PNA, a fib,  seizure disorder s/p resection of meningioma 2023, breast cancer.   Clinical Impression   PTA, pt lived with husband and had caregivers to assist with ADL 8 hours/day; husband also assists. Upon eval, pt with decreased activity tolerance and strength as compared to her reported baseline. Pt performing UB ADL with set-up and LB ADL with up to max A. Pt interested in learning compensatory techniques to optimize independence. Pt with slowed cognitive processing throughout, but following all commands. Recommending HHOT at discharge.      If plan is discharge home, recommend the following: A little help with walking and/or transfers;A lot of help with bathing/dressing/bathroom;Assistance with cooking/housework;Direct supervision/assist for medications management;Direct supervision/assist for financial management;Assist for transportation;Help with stairs or ramp for entrance    Functional Status Assessment  Patient has had a recent decline in their functional status and demonstrates the ability to make significant improvements in function in a reasonable and predictable amount of time.  Equipment Recommendations  BSC/3in1    Recommendations for Other Services       Precautions / Restrictions Precautions Precautions: Fall Precaution Comments: on 2LO2 presently, SPO2 88-95% on RA throughout session but 2LO2 reapplied upon PT exit Restrictions Weight Bearing Restrictions: No      Mobility Bed Mobility Overal bed mobility: Needs Assistance Bed Mobility: Supine to Sit, Sit to Supine     Supine to sit: Min assist, HOB elevated Sit to supine: Mod assist, HOB elevated   General bed  mobility comments: assist for trunk and LE management, boost up in bed. increased time    Transfers Overall transfer level: Needs assistance Equipment used: Rolling walker (2 wheels), Rollator (4 wheels) Transfers: Sit to/from Stand Sit to Stand: Min assist           General transfer comment: light rise and steady assist      Balance Overall balance assessment: Needs assistance Sitting-balance support: No upper extremity supported, Feet supported Sitting balance-Leahy Scale: Fair     Standing balance support: Bilateral upper extremity supported, During functional activity Standing balance-Leahy Scale: Poor Standing balance comment: reliant on AD                           ADL either performed or assessed with clinical judgement   ADL Overall ADL's : Needs assistance/impaired Eating/Feeding: Set up;Bed level   Grooming: Set up;Sitting   Upper Body Bathing: Set up;Sitting   Lower Body Bathing: Moderate assistance;Sit to/from stand   Upper Body Dressing : Set up;Sitting   Lower Body Dressing: Maximal assistance;Sit to/from stand   Toilet Transfer: Ambulation;Contact guard assist;Rollator (4 wheels)           Functional mobility during ADLs: Minimal assistance;Rollator (4 wheels)       Vision Baseline Vision/History: 1 Wears glasses Ability to See in Adequate Light: 0 Adequate Patient Visual Report: No change from baseline Additional Comments: not formally assessed, but denies changes     Perception Perception: Not tested       Praxis Praxis: Not tested       Pertinent Vitals/Pain Pain Assessment Pain Assessment: No/denies pain     Extremity/Trunk Assessment Upper Extremity Assessment Upper Extremity Assessment: Generalized weakness;LUE deficits/detail (tremor)  LUE Deficits / Details: weaker as compared to R 4/5   Lower Extremity Assessment Lower Extremity Assessment: Defer to PT evaluation   Cervical / Trunk Assessment Cervical /  Trunk Assessment: Kyphotic   Communication Communication Communication: No apparent difficulties Cueing Techniques: Verbal cues;Gestural cues   Cognition Arousal: Alert Behavior During Therapy: Flat affect Overall Cognitive Status: Impaired/Different from baseline Area of Impairment: Attention, Following commands, Safety/judgement, Problem solving                   Current Attention Level: Sustained   Following Commands: Follows one step commands with increased time Safety/Judgement: Decreased awareness of safety   Problem Solving: Slow processing, Decreased initiation, Difficulty sequencing, Requires verbal cues, Requires tactile cues General Comments: very slowed processing, unsure of pt baseline. answers questions appropriately and providing accurate history     General Comments       Exercises     Shoulder Instructions      Home Living Family/patient expects to be discharged to:: Private residence Living Arrangements: Spouse/significant other Available Help at Discharge: Family;Available PRN/intermittently Type of Home: House Home Access: Ramped entrance     Home Layout: Able to live on main level with bedroom/bathroom     Bathroom Shower/Tub: Other (comment) (walk in tub)   Bathroom Toilet: Standard Bathroom Accessibility: Yes   Home Equipment: Agricultural consultant (2 wheels);Rollator (4 wheels);BSC/3in1;Wheelchair - manual;Hospital bed          Prior Functioning/Environment Prior Level of Function : Needs assist       Physical Assist : Mobility (physical);ADLs (physical) Mobility (physical): Bed mobility ADLs (physical): Bathing;Dressing;Toileting Mobility Comments: uses RW for gait, working towards no AD prior to "going downhill" starting around labor day ADLs Comments: assist from husband for all ADLs except self feeding per pt. Has caregivers 8-9 hours a day        OT Problem List: Decreased strength;Decreased activity tolerance;Impaired  balance (sitting and/or standing);Decreased cognition;Decreased safety awareness;Decreased knowledge of use of DME or AE      OT Treatment/Interventions: Self-care/ADL training;Therapeutic exercise;DME and/or AE instruction;Balance training;Patient/family education;Therapeutic activities;Cognitive remediation/compensation    OT Goals(Current goals can be found in the care plan section) Acute Rehab OT Goals Patient Stated Goal: get better OT Goal Formulation: With patient Time For Goal Achievement: 07/04/23 Potential to Achieve Goals: Good  OT Frequency: Min 1X/week    Co-evaluation              AM-PAC OT "6 Clicks" Daily Activity     Outcome Measure Help from another person eating meals?: A Little Help from another person taking care of personal grooming?: A Little Help from another person toileting, which includes using toliet, bedpan, or urinal?: A Little Help from another person bathing (including washing, rinsing, drying)?: A Lot Help from another person to put on and taking off regular upper body clothing?: A Little Help from another person to put on and taking off regular lower body clothing?: A Lot 6 Click Score: 16   End of Session Equipment Utilized During Treatment: Gait belt;Rollator (4 wheels) Nurse Communication: Mobility status  Activity Tolerance: Patient tolerated treatment well Patient left: in bed;with call bell/phone within reach;with bed alarm set  OT Visit Diagnosis: Unsteadiness on feet (R26.81);Muscle weakness (generalized) (M62.81);Other symptoms and signs involving cognitive function                Time: 1120-1145 OT Time Calculation (min): 25 min Charges:  OT General Charges $OT Visit: 1 Visit OT Evaluation $OT Eval  Moderate Complexity: 1 Mod OT Treatments $Self Care/Home Management : 8-22 mins  Tyler Deis, OTR/L [facility] Acute Rehabilitation Office: [phone]   Myrla Halsted 06/20/2023, 12:59 PM

## 2023-06-20 NOTE — Procedures (Addendum)
Modified Barium Swallow Study  Patient Details  Name: Tara [name] MRN: [id number] Date of Birth: 17-Apr-1946  Today's Date: 06/20/2023  Modified Barium Swallow completed.  Full report located under Chart Review in the Imaging Section.  History of Present Illness 77 y.o. female presenting 9/27 with cough and SHOB. CXR positive for retrocardiac opacity / nodularity. Pt has had x3 asp PNA in the past year and is on a mech soft diet at home. Pt complains on coughing with thins during meals and after meals. Pt with h/x of GI Workup for AKI, CAP. PMH: aspiration PNA, a fib,  seizure disorder s/p resection of meningioma 2023, breast cancer.   Clinical Impression Pt seen for MBSS to determine safest diet recs. Pt assessed in the lateral position and assessed with thins via straw, puree via tsp, regular solids, and barium tablet. Pt took sips of thin via straw, x2 small sips and x1 sequential sips with no aspiration/penetration. The pt's swallow trigger was at the level of the pyriform sinuses, pt had partial distention and obstruction of flow for thin sequential sips. The pt had trace oral residue and mild oral residue at the vallecula space which was independently and successfully cleared by a second swallow. The pt had x2 bites of puree via teaspoon independently with no aspiration/penetration present. The pt's swallow was initiated at the vallecula space and partial distention at UES opening, mild esophageal distention. Pt consumed x1 bite of regular solids with penetration/aspiration present, the pt had adequate UES opening. Pt consumed barium pill with thin via straw, pt initiated swallow at level of pyriform sinuses and had moderate residue in the vallecula space. Pt independently did a second swallow, minimal residue in the pyriform sinus present following second swallow. Esophageal retention with retrograde back below the level of the UES present. Pt given puree to clear bolus, tablet and thins  already cleared to the stomach by the time of puree admission. The pt presents with a mild oropharyngeal dysphagia characterized by: delayed swallow initiation, poor UES opening, and poor BOT for all consistencies. No aspiration or penetration for all consistencies. Safest diet rec continues dys 3/thin liquid with STRICT aspiration precautions (small bites and sips, reduced intake rate, intermittent throat clearance then reswallow PRN, and PT IS TO REMAIN UPRIGHT FOR ALL PO INTAKE AND AT LEAST ONE HOUR FOLLOWING). Given signs of esophageal retention and GI h/x, GI consult recommended. SLP to follow up with diet tolerance and compliance with compensatory strategies. Factors that may increase risk of adverse event in presence of aspiration [employer] Clearance Coots 2021): Respiratory or GI disease  Swallow Evaluation Recommendations Recommendations: PO diet PO Diet Recommendation: Dysphagia 3 (Mechanical soft);Thin liquids (Level 0) Liquid Administration via: Straw (pt preference) Medication Administration: Whole meds with puree Supervision: Patient able to self-feed Swallowing strategies  : Clear throat intermittently;Small bites/sips;Slow rate;Multiple dry swallows after each bite/sip Postural changes: Stay upright 30-60 min after meals;Position pt fully upright for meals Oral care recommendations: Oral care BID (2x/day) Recommended consults: Consider GI consultation;Consider esophageal assessment     Dione Housekeeper M.S. CCC-SLP

## 2023-06-20 NOTE — Evaluation (Signed)
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   Anxiety    Breast cancer (HCC) 2008   right - s/p lumpectomy->chemo, radiation   Depression    Dysrhythmia    hx SVT   Family history of colon cancer    Family history of prostate cancer    GERD (gastroesophageal reflux disease)    H/O: hysterectomy    Hiatal hernia    History of cancer chemotherapy    History of radiation therapy    Hyperlipidemia    Hypertension 01/25/2018   Nonischemic cardiomyopathy (HCC)    Personal history of radiation therapy 01/05/209   SVT (supraventricular tachycardia)    short RP SVT documented 5/14   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HCC)    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)   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   Past Surgical History:  Past Surgical History:  Procedure Laterality Date   ABDOMINAL HYSTERECTOMY  1994   fibroids,    BIOPSY  06/19/2016   Procedure: BIOPSY;  Surgeon: Corbin Ade, MD;  Location: [facility];  Service: Endoscopy;;  gastric duodenum   BREAST BIOPSY Right 12/16/2006   malignant   BREAST EXCISIONAL BIOPSY Right 2018   benign lumpectomy   BREAST LUMPECTOMY Right 02/2007   BREAST LUMPECTOMY WITH RADIOACTIVE SEED LOCALIZATION Right 07/29/2017   Procedure: RIGHT BREAST LUMPECTOMY WITH RADIOACTIVE SEED LOCALIZATION;  Surgeon: Claud Kelp, MD;  Location: [facility] OR;  Service: General;  Laterality: Right;   BREAST SURGERY Right 2008   lumpectomy, cancer   CARDIAC CATHETERIZATION     CHOLECYSTECTOMY  1999   COLONOSCOPY  2008   Dr. Darrick Penna: multiple polyps. Path not  available at time of visit.    COLONOSCOPY WITH PROPOFOL N/A 04/25/2014   Dr. Darrick Penna: Multiple tubular adenomas removed. Diverticulosis. Moderate internal hemorrhoids. Next colonoscopy planned for August 2018.   CRANIOTOMY Bilateral 03/24/2022   Procedure: Bifrontal craniotomy for resection of meningioma;  Surgeon: Coletta Memos, MD;  Location: [facility] OR;  Service: Neurosurgery;  Laterality: Bilateral;   ESOPHAGOGASTRODUODENOSCOPY (EGD) WITH PROPOFOL N/A 06/19/2016   Procedure: ESOPHAGOGASTRODUODENOSCOPY (EGD) WITH PROPOFOL;  Surgeon: Corbin Ade, MD;  Location: [facility];  Service: Endoscopy;  Laterality: N/A;   MASTECTOMY W/ SENTINEL NODE BIOPSY Right 06/09/2018   Procedure: RIGHT TOTAL MASTECTOMY WITH SENTINEL LYMPH NODE BIOPSY;  Surgeon: Claud Kelp, MD;  Location: [facility] OR;  Service: General;  Laterality: Right;   POLYPECTOMY N/A 04/25/2014   Procedure: POLYPECTOMY;  Surgeon: West Bali, MD;  Location: [facility];  Service: Endoscopy;  Laterality: N/A;  Ascending and Decending Colon x3 , Transverse colon x2, rectal   PORTACATH PLACEMENT N/A 06/09/2018   Procedure: INSERTION PORT-A-CATH;  Surgeon: Claud Kelp, MD;  Location: [facility] OR;  Service: General;  Laterality: N/A;   RIGHT/LEFT HEART CATH AND CORONARY ANGIOGRAPHY N/A 07/10/2017   Procedure: RIGHT/LEFT HEART CATH AND CORONARY ANGIOGRAPHY;  Surgeon: Laurey Morale, MD;  Location: [facility] INVASIVE CV LAB;  Service: Cardiovascular;  Laterality: N/A;   HPI:  77 y.o. female presenting 9/27 with cough and SHOB. CXR  positive for retrocardiac opacity / nodularity. Pt has had x3 asp PNA in the past year and is on a mech soft diet at home. Pt complains on coughing with thins during meals and after meals. Pt with h/x of GI Workup for AKI, CAP. PMH: aspiration PNA, a fib,  seizure disorder s/p resection of meningioma 2023, breast cancer.    Assessment / Plan / Recommendation  Clinical Impression  Pt seen for skilled ST services to address swallow  evaluation. The pt is currently on a dysphagia 3/thin liquid diet which is baseline at home. Pt and pt's husband report increased difficulty with PO intake of thins in the last several weeks but no concerns for solids. The pt was assessed with thin liquid, puree, and soft solids. The pt was encountered during a meal, OME functionally WFL, although cued swallow had reduced hyolaryngeal excursion and moderate oral residue present from the meal. The pt had x2 small sips via straw with slight wet vocal quality, following a sequential sip, the pt had wet vocal quality and an immediate throat clearance. Pt also observed having an immediate cough following PO intake of thins upon arrival. The pt had x2 puree with wet vocal quality and x1 immediate throat clearance and x1 immediate cough. The pt consumed several bites of soft solids with immediate throat clearances and intermittent wet vocal quality. The pt reports doing a cued cough/throat clearance after every bite per [facility] SLP. The pt's voice was hoarse t/o and the pt had intermittent coughing and belching t/o the eval. Given the pt's s/s of aspiration and pulmonary status, the pt would benefit from a modified barium swallow study to determine if aspiration is occurring. Safest diet rec remains dys 3/thin with STRICT aspiration precautions until MBSS is completed. SLP to f/u with MBS when possible. SLP Visit Diagnosis: Dysphagia, unspecified (R13.10)    Aspiration Risk       Diet Recommendation Dysphagia 3 (Mech soft);Thin liquid    Liquid Administration via: Straw (Pt preference) Medication Administration: Whole meds with liquid Supervision: Patient able to self feed Compensations: Clear throat intermittently;Clear throat after each swallow;Slow rate;Small sips/bites Postural Changes: Seated upright at 90 degrees;Remain upright for at least 30 minutes after po intake    Other  Recommendations Recommended Consults: Consider GI evaluation;Consider esophageal  assessment;Other (Comment) (Pending MBS results) Oral Care Recommendations: Oral care QID    Recommendations for follow up therapy are one component of a multi-disciplinary discharge planning process, led by the attending physician.  Recommendations may be updated based on patient status, additional functional criteria and insurance authorization.     Assistance Recommended at Discharge    Functional Status Assessment Patient has had a recent decline in their functional status and demonstrates the ability to make significant improvements in function in a reasonable and predictable amount of time.  Frequency and Duration min 3x week  2 weeks       Prognosis Prognosis for improved oropharyngeal function: Fair Barriers to Reach Goals: Severity of deficits;Other (Comment) (h/x of dysphagia)      Swallow Study   General Date of Onset:  (Round two weeks of coughing) HPI: 77 y.o. female presenting 9/27 with cough and SHOB. CXR positive for retrocardiac opacity / nodularity. Pt has had x3 asp PNA in the past year and is on a mech soft diet at home. Pt complains on coughing with thins during meals and after meals. Pt with h/x of GI Workup for AKI, CAP. PMH: aspiration PNA, a fib,  seizure  disorder s/p resection of meningioma 2023, breast cancer. Type of Study: Bedside Swallow Evaluation Previous Swallow Assessment: previous MBS on 1/18 (cleared for dysphagia 3 and thin liquids) Diet Prior to this Study: Dysphagia 3 (mechanical soft);Thin liquids (Level 0) Respiratory Status: Nasal cannula (2L of O2 via [place]) History of Recent Intubation: No Behavior/Cognition: Alert;Cooperative;Pleasant mood Oral Cavity Assessment: Within Functional Limits Oral Care Completed by SLP: No Vision: Functional for self-feeding Self-Feeding Abilities: Able to feed self;Needs assist (Some assist required depending on tremor) Patient Positioning: Upright in bed Baseline Vocal Quality: Hoarse (pt reports hoarse voice is  due to coughing) Volitional Cough: Strong Volitional Swallow: Able to elicit (minimal hyolaryngeal excursion noticied)    Oral/Motor/Sensory Function Overall Oral Motor/Sensory Function: Within functional limits   Ice Chips     Thin Liquid Thin Liquid: Impaired Presentation: Spoon Pharyngeal  Phase Impairments: Decreased hyoid-laryngeal movement;Wet Vocal Quality;Throat Clearing - Immediate;Throat Clearing - Delayed;Cough - Immediate    Nectar Thick     Honey Thick     Puree Puree: Impaired Presentation: Self Fed;Spoon Pharyngeal Phase Impairments: Decreased hyoid-laryngeal movement;Wet Vocal Quality;Throat Clearing - Immediate;Cough - Immediate   Solid     Solid: Impaired Presentation: Self Fed Oral Phase Functional Implications: Oral residue Pharyngeal Phase Impairments: Decreased hyoid-laryngeal movement;Wet Vocal Quality;Cough - Immediate      Dione Housekeeper M.S. CCC-SLP

## 2023-06-20 NOTE — Progress Notes (Signed)
Patient received to room 2W39 from ED. Patient oriented to room and staff. Patient's spouse at bedside. Call light within reach.

## 2023-06-20 NOTE — Plan of Care (Signed)

## 2023-06-20 NOTE — Hospital Course (Addendum)
Tara [name] is a 77 y.o. female with medical history significant of seizure disorder following resection of meningioma in June 2023, breast CA in 2008, PAF on eliquis and amiodarone, aspiration PNA x3 with history of tube feeds.  She has since been transitioned off tube feeds and started back on dysphagia diet's.   She developed cough and some SOB for the past 2 weeks now.  She was on outpatient antibiotics but presented due to persistence of symptoms.  CXR in epic on 9/11 was neg for PNA.  COVID, FLU, RSV were neg on 9/11.   CXR on admission was concerning for a small left retrocardiac opacity.  She was started on antibiotics due to concern for aspiration and SLP was consulted.

## 2023-06-21 DIAGNOSIS — J189 Pneumonia, unspecified organism: Secondary | ICD-10-CM | POA: Diagnosis not present

## 2023-06-21 DIAGNOSIS — R0902 Hypoxemia: Secondary | ICD-10-CM | POA: Diagnosis not present

## 2023-06-21 DIAGNOSIS — R1312 Dysphagia, oropharyngeal phase: Secondary | ICD-10-CM | POA: Diagnosis not present

## 2023-06-21 DIAGNOSIS — N179 Acute kidney failure, unspecified: Secondary | ICD-10-CM | POA: Diagnosis not present

## 2023-06-21 LAB — GLUCOSE, CAPILLARY: Glucose-Capillary: 91 mg/dL (ref 70–99)

## 2023-06-21 MED ORDER — DOXYCYCLINE HYCLATE 100 MG PO TABS
100.0000 mg | ORAL_TABLET | Freq: Two times a day (BID) | ORAL | Status: DC
  Administered 2023-06-21 – 2023-06-22 (×2): 100 mg via ORAL
  Filled 2023-06-21 (×2): qty 1

## 2023-06-21 NOTE — Progress Notes (Signed)
Progress Note    Tara [name]   ZOX:[id number]  DOB: 1946-04-24  DOA: 06/19/2023     1 PCP: Tara Perches, MD  Initial CC: cough  Hospital Course: Tara [name] is a 77 y.o. female with medical history significant of seizure disorder following resection of meningioma in June 2023, breast CA in 2008, PAF on eliquis and amiodarone, aspiration PNA x3 with history of tube feeds.  She has since been transitioned off tube feeds and started back on dysphagia diet's.   She developed cough and some SOB for the past 2 weeks now.  She was on outpatient antibiotics but presented due to persistence of symptoms.  CXR in epic on 9/11 was neg for PNA.  COVID, FLU, RSV were neg on 9/11.   CXR on admission was concerning for a small left retrocardiac opacity.  She was started on antibiotics due to concern for aspiration and SLP was consulted.  Interval History:  No events overnight.  Ambulated this morning with staff and still had O2 desaturation.  We will continue attempting weaning oxygen but may need home O2.  Assessment and Plan: * AKI (acute kidney injury) (HCC) - Presumed prerenal - Creatinine 2.15 on admission - Improving some with fluids - Continue fluids and repeat BMP in a.m.  Hypoxia - Not on oxygen at home at baseline - Suspect in setting of underlying aspiration event - Still having desaturations with ambulation; may need home O2 if recurs again tomorrow on walk test  Community acquired pneumonia of left lower lobe of lung Retrocardiac nodularity / opacity that could be c/w PNA and/or aspiration - continue rocephin and doxy - COVID, FLU, RSV were neg on 9/11  Dysphagia - H/o chronic dysphagia and prior aspiration pneumonias.  S/p PEG in past too - appreciate SLP eval. patient also underwent swallow study during this admission - No aspiration noted but recommended to continue on dysphagia 3 diet  Paroxysmal atrial fibrillation (HCC) Cont amiodarone and  eliquis.  Seizure disorder (HCC) Onset in 2023 following resection of meningioma. Looks like pt on Keppra 750 BID + Vimpat (Dose decreased to 100 BID after levels came back very high at Feb Neuro office visit). Cont Vimpat 100 BID Cont Keppra 750 BID Pharm consult to adjust doses for renal fxn ([name]) if needed Looks like the Tara [name] is a PCP prescribed med and not being used to treat seizure disorder.  Chronic HFrEF (heart failure with reduced ejection fraction) (HCC) EF 40% Watch for development of volume overload with IVF being used to treat her AKI.    Old records reviewed in assessment of this patient  Antimicrobials: Rocephin 06/19/2023 >> current Doxycycline 06/19/2023 >> current  DVT prophylaxis:   apixaban (ELIQUIS) tablet 5 mg   Code Status:   Code Status: Do not attempt resuscitation (DNR) PRE-ARREST INTERVENTIONS DESIRED  Mobility Assessment (Last 72 Hours)     Mobility Assessment     Row Name 06/21/23 0902 06/20/23 2015 06/20/23 1200 06/20/23 1014 06/20/23 0720   Does patient have an order for bedrest or is patient medically unstable No - Continue assessment No - Continue assessment -- -- No - Continue assessment   What is the highest level of mobility based on the progressive mobility assessment? Level 4 (Walks with assist in room) - Balance while marching in place and cannot step forward and back - Complete Level 4 (Walks with assist in room) - Balance while marching in place and cannot step forward and back -  Complete Level 4 (Walks with assist in room) - Balance while marching in place and cannot step forward and back - Complete Level 4 (Walks with assist in room) - Balance while marching in place and cannot step forward and back - Complete Level 4 (Walks with assist in room) - Balance while marching in place and cannot step forward and back - Complete    Row Name 06/19/23 2324           Does patient have an order for bedrest or is patient medically  unstable No - Continue assessment       What is the highest level of mobility based on the progressive mobility assessment? Level 4 (Walks with assist in room) - Balance while marching in place and cannot step forward and back - Complete                Barriers to discharge: none Disposition Plan:  Home Status is: Inpt  Objective: Blood pressure 111/75, pulse 75, temperature 97.6 F (36.4 C), temperature source Oral, resp. rate 17, height 4\' 9"  (1.448 m), weight 73.5 kg, SpO2 95%.  Examination:  Physical Exam Constitutional:      Appearance: Normal appearance.  HENT:     Head: Normocephalic and atraumatic.     Mouth/Throat:     Mouth: Mucous membranes are moist.  Eyes:     Extraocular Movements: Extraocular movements intact.  Cardiovascular:     Rate and Rhythm: Normal rate and regular rhythm.  Pulmonary:     Effort: Pulmonary effort is normal. No respiratory distress.     Breath sounds: Normal breath sounds. No wheezing.  Abdominal:     General: Bowel sounds are normal. There is no distension.     Palpations: Abdomen is soft.     Tenderness: There is no abdominal tenderness.  Musculoskeletal:        General: Normal range of motion.     Cervical back: Normal range of motion and neck supple.  Skin:    General: Skin is warm and dry.  Neurological:     General: No focal deficit present.     Mental Status: She is alert.  Psychiatric:        Mood and Affect: Mood normal.      Consultants:    Procedures:    Data Reviewed: No results found for this or any previous visit (from the past 24 hour(s)).   I have reviewed pertinent nursing notes, vitals, labs, and images as necessary. I have ordered labwork to follow up on as indicated.  I have reviewed the last notes from staff over past 24 hours. I have discussed patient's care plan and test results with nursing staff, CM/SW, and other staff as appropriate.    LOS: 1 day   Lewie Chamber, MD [facility] 06/21/2023, 12:27 PM

## 2023-06-21 NOTE — Progress Notes (Signed)
MEWS Progress Note  Patient Details Name: LYNNEX FULP MRN: [id number] DOB: 02-Nov-1945 Today's Date: 06/21/2023   MEWS Flowsheet Documentation:  Assess: MEWS Score Temp: 97.6 F (36.4 C) BP: 111/75 MAP (mmHg): 85 Pulse Rate: 75 ECG Heart Rate: 80 Resp: 17 Level of Consciousness: Alert SpO2: 95 % O2 Device: Nasal Cannula O2 Flow Rate (L/min): 2 L/min Assess: MEWS Score MEWS Temp: 0 MEWS Systolic: 0 MEWS Pulse: 0 MEWS RR: 0 MEWS LOC: 0 MEWS Score: 0 MEWS Score Color: Green Assess: SIRS CRITERIA SIRS Temperature : 0 SIRS Respirations : 0 SIRS Pulse: 0 SIRS WBC: 0 SIRS Score Sum : 0          Santiago Bumpers 06/21/2023, 10:40 AM

## 2023-06-21 NOTE — Assessment & Plan Note (Signed)
-   Not on oxygen at home at baseline - Suspect in setting of underlying aspiration event - Still having desaturations with ambulation; may need home O2 if recurs again tomorrow on walk test

## 2023-06-21 NOTE — Progress Notes (Signed)
Pt up walking without O2, sat dropped to 80% recovered up to 89% then dropped into the low 80's. Pt placed back in bed and 2L O2 [place] reapplied O2 back to 95%

## 2023-06-21 NOTE — Plan of Care (Signed)

## 2023-06-22 DIAGNOSIS — N179 Acute kidney failure, unspecified: Secondary | ICD-10-CM | POA: Diagnosis not present

## 2023-06-22 DIAGNOSIS — J189 Pneumonia, unspecified organism: Secondary | ICD-10-CM | POA: Diagnosis not present

## 2023-06-22 DIAGNOSIS — R0902 Hypoxemia: Secondary | ICD-10-CM | POA: Diagnosis not present

## 2023-06-22 DIAGNOSIS — R1312 Dysphagia, oropharyngeal phase: Secondary | ICD-10-CM | POA: Diagnosis not present

## 2023-06-22 LAB — GLUCOSE, CAPILLARY: Glucose-Capillary: 86 mg/dL (ref 70–99)

## 2023-06-22 LAB — BASIC METABOLIC PANEL
Anion gap: 10 (ref 5–15)
BUN: 23 mg/dL (ref 8–23)
CO2: 18 mmol/L — ABNORMAL LOW (ref 22–32)
Calcium: 8.2 mg/dL — ABNORMAL LOW (ref 8.9–10.3)
Chloride: 110 mmol/L (ref 98–111)
Creatinine, Ser: 1.98 mg/dL — ABNORMAL HIGH (ref 0.44–1.00)
GFR, Estimated: 26 mL/min — ABNORMAL LOW (ref >60)
Glucose, Bld: 79 mg/dL (ref 70–99)
Potassium: 3.3 mmol/L — ABNORMAL LOW (ref 3.5–5.1)
Sodium: 138 mmol/L (ref 135–145)

## 2023-06-22 MED ORDER — POTASSIUM CHLORIDE CRYS ER 20 MEQ PO TBCR
40.0000 meq | EXTENDED_RELEASE_TABLET | Freq: Once | ORAL | Status: AC
  Administered 2023-06-22: 40 meq via ORAL
  Filled 2023-06-22: qty 2

## 2023-06-22 MED ORDER — LEVETIRACETAM 500 MG PO TABS: 500.0000 mg | ORAL_TABLET | Freq: Two times a day (BID) | ORAL | 2 refills | Status: DC

## 2023-06-22 MED ORDER — DOXYCYCLINE HYCLATE 100 MG PO TABS: 100.0000 mg | ORAL_TABLET | Freq: Two times a day (BID) | ORAL | 0 refills | Status: AC

## 2023-06-22 NOTE — Discharge Instructions (Addendum)
Take new Keppra dose of 500 mg twice a day until your kidney function improves after bloodwork check at follow up. Once better, discuss with your neurologist about resuming prior dose.   ---------------------------------------------------------------------------------------------------------------------------------------  Information on my medicine - ELIQUIS (apixaban)  This medication education was reviewed with me or my healthcare representative as part of my discharge preparation.    Why was Eliquis prescribed for you? Eliquis was prescribed for you to reduce the risk of a blood clot forming that can cause a stroke if you have a medical condition called atrial fibrillation (a type of irregular heartbeat).  What do You need to know about Eliquis ? Take your Eliquis TWICE DAILY - one tablet in the morning and one tablet in the evening with or without food. If you have difficulty swallowing the tablet whole please discuss with your pharmacist how to take the medication safely.  Take Eliquis exactly as prescribed by your doctor and DO NOT stop taking Eliquis without talking to the doctor who prescribed the medication.  Stopping may increase your risk of developing a stroke.  Refill your prescription before you run out.  After discharge, you should have regular check-up appointments with your healthcare provider that is prescribing your Eliquis.  In the future your dose may need to be changed if your kidney function or weight changes by a significant amount or as you get older.  What do you do if you miss a dose? If you miss a dose, take it as soon as you remember on the same day and resume taking twice daily.  Do not take more than one dose of ELIQUIS at the same time to make up a missed dose.  Important Safety Information A possible side effect of Eliquis is bleeding. You should call your healthcare provider right away if you experience any of the following: Bleeding from an injury or  your nose that does not stop. Unusual colored urine (red or dark brown) or unusual colored stools (red or black). Unusual bruising for unknown reasons. A serious fall or if you hit your head (even if there is no bleeding).  Some medicines may interact with Eliquis and might increase your risk of bleeding or clotting while on Eliquis. To help avoid this, consult your healthcare provider or pharmacist prior to using any new prescription or non-prescription medications, including herbals, vitamins, non-steroidal anti-inflammatory drugs (NSAIDs) and supplements.  This website has more information on Eliquis (apixaban): http://www.eliquis.com/eliquis/home

## 2023-06-22 NOTE — Plan of Care (Signed)
  Problem: Education: Goal: Knowledge of General Education information will improve Description: Including pain rating scale, medication(s)/side effects and non-pharmacologic comfort measures Outcome: Progressing   Problem: Health Behavior/Discharge Planning: Goal: Ability to manage health-related needs will improve Outcome: Progressing   Problem: Activity: Goal: Risk for activity intolerance will decrease Outcome: Progressing   

## 2023-06-22 NOTE — Progress Notes (Addendum)
Transition of Care [facility]) - Inpatient Brief Assessment   Patient Details  Name: Tara [name] MRN: [id number] Date of Birth: 04-24-1946  [facility] CM/SW Contact:    Janae Bridgeman, RN Phone Number: 06/22/2023, 1:15 PM   Clinical Narrative: CM met with the patient and spouse at the bedside to discuss TOC needs.  The patient admitted for respiratory infection and will need home oxygen.  I spoke with the patient and spouse at the bedside and spouse did not have preference for DME company for the oxygen.    I called Jermaine, CM with [employer] and ordered the home oxygen.  Bedside nursing to repeat the ambulation test and place oxygen on the template to order home oxygen.  Spouse states that the patient was recently active with [facility]. I called Kandee Keen, [facility] and patient was set up with [facility] for home health services.  The patient's husband will provide transportation home by car when patient I medically stable for discharge.  9/30 - I called [employer] and home oxygen was cancelled since the patient was able to ambulate without oxygen needs.  Transition of Care Asessment: Insurance and Status: (P) Insurance coverage has been reviewed Patient has primary care physician: (P) Yes Home environment has been reviewed: (P) home with spouse Prior level of function:: (P) family assistance Prior/Current Home Services: (P) No current home services Social Determinants of Health Reivew: (P) SDOH reviewed interventions complete Readmission risk has been reviewed: (P) Yes Transition of care needs: (P) transition of care needs identified, TOC will continue to follow

## 2023-06-22 NOTE — Progress Notes (Signed)
Physical Therapy Treatment Patient Details Name: Tara [name] MRN: [id number] DOB: 08-07-1946 Today's Date: 06/22/2023   History of Present Illness 77 y.o. female presenting 9/27 with cough and SHOB. CXR positive for retrocardiac opacity / nodularity. Workup for AKI, CAP. PMH: aspiration PNA, a fib,  seizure disorder s/p resection of meningioma 2023, breast cancer.    PT Comments  Pt greeted resting in bed and agreeable to session with steady progress towards acute goals. Pt VSS on RA throughout session (see saturations qualifications note). Pt needing grossly CGA for transfers and gait with RW for support for short household distance in room, however pt limited by fatigue. Reviewed IS use with pt able to return demonstration with cues for technique. Pt was educated on continued walker use to maximize functional independence, safety, and decrease risk for falls as well as appropriate activity recommendations and importance of time up OOB. Pt continues to benefit from skilled PT services to progress toward functional mobility goals.     If plan is discharge home, recommend the following: A little help with walking and/or transfers;A little help with bathing/dressing/bathroom   Can travel by private vehicle        Equipment Recommendations  None recommended by PT    Recommendations for Other Services       Precautions / Restrictions Precautions Precautions: Fall Restrictions Weight Bearing Restrictions: No     Mobility  Bed Mobility Overal bed mobility: Needs Assistance Bed Mobility: Supine to Sit, Sit to Supine     Supine to sit: Min assist, HOB elevated Sit to supine: Min assist   General bed mobility comments: assist for trunk and LE management    Transfers Overall transfer level: Needs assistance Equipment used: Rolling walker (2 wheels), Rollator (4 wheels) Transfers: Sit to/from Stand Sit to Stand: Contact guard assist           General transfer comment: CGA  for safety    Ambulation/Gait Ambulation/Gait assistance: Contact guard assist Gait Distance (Feet): 30 Feet Assistive device: Rolling walker (2 wheels) Gait Pattern/deviations: Step-through pattern, Decreased stride length, Trunk flexed, Leaning posteriorly Gait velocity: decr     General Gait Details: CGA for safety, very slow labored gait, cues for RW proximity and uproght trunk   Stairs             Wheelchair Mobility     Tilt Bed    Modified Rankin (Stroke Patients Only)       Balance Overall balance assessment: Needs assistance Sitting-balance support: No upper extremity supported, Feet supported Sitting balance-Leahy Scale: Fair     Standing balance support: Bilateral upper extremity supported, During functional activity Standing balance-Leahy Scale: Poor Standing balance comment: reliant on AD                            Cognition Arousal: Alert Behavior During Therapy: Flat affect Overall Cognitive Status: Impaired/Different from baseline Area of Impairment: Attention, Following commands, Safety/judgement, Problem solving                   Current Attention Level: Sustained   Following Commands: Follows one step commands with increased time Safety/Judgement: Decreased awareness of safety   Problem Solving: Slow processing, Decreased initiation, Difficulty sequencing, Requires verbal cues, Requires tactile cues General Comments: very slowed processing, unsure of pt baseline. answers questions appropriately and providing accurate history        Exercises      General Comments General comments (  skin integrity, edema, etc.): VSS on RA throughout session      Pertinent Vitals/Pain Pain Assessment Pain Assessment: No/denies pain    Home Living                          Prior Function            PT Goals (current goals can now be found in the care plan section) Acute Rehab PT Goals Patient Stated Goal:  home PT Goal Formulation: With patient/family Time For Goal Achievement: 07/04/23 Progress towards PT goals: Progressing toward goals    Frequency    Min 1X/week      PT Plan      Co-evaluation              AM-PAC PT "6 Clicks" Mobility   Outcome Measure  Help needed turning from your back to your side while in a flat bed without using bedrails?: A Little Help needed moving from lying on your back to sitting on the side of a flat bed without using bedrails?: A Little Help needed moving to and from a bed to a chair (including a wheelchair)?: A Little Help needed standing up from a chair using your arms (e.g., wheelchair or bedside chair)?: A Little Help needed to walk in hospital room?: A Little Help needed climbing 3-5 steps with a railing? : A Lot 6 Click Score: 17    End of Session   Activity Tolerance: Patient tolerated treatment well Patient left: with call bell/phone within reach;in bed;with bed alarm set;with family/visitor present Nurse Communication: Mobility status PT Visit Diagnosis: Other abnormalities of gait and mobility (R26.89);Difficulty in walking, not elsewhere classified (R26.2);Muscle weakness (generalized) (M62.81)     Time: 7829-5621 PT Time Calculation (min) (ACUTE ONLY): 24 min  Charges:    $Therapeutic Activity: 23-37 mins PT General Charges $$ ACUTE PT VISIT: 1 Visit                     Royetta Probus R. PTA Acute Rehabilitation Services Office: [phone]   Catalina Antigua 06/22/2023, 3:59 PM

## 2023-06-22 NOTE — Progress Notes (Signed)
SATURATION QUALIFICATIONS: (This note is used to comply with regulatory documentation for home oxygen)  Patient Saturations on Room Air at Rest = 96%  Patient Saturations on Room Air while Ambulating = 92%  Please briefly explain why patient needs home oxygen: N/A.  Pt maintains a safe saturation level on room air during activity.   Tara [name]. PTA Acute Rehabilitation Services Office: [phone]

## 2023-06-22 NOTE — Progress Notes (Signed)
Ok to give kdur x1 to replace k per Dr. Frederick Peers.  Ulyses Southward, PharmD, BCIDP, AAHIVP, CPP Infectious Disease Pharmacist 06/22/2023 11:03 AM

## 2023-06-22 NOTE — Discharge Summary (Signed)
CL 106 112* 110  CO2 21* 19* 18*  GLUCOSE 98 97  79  BUN 22 21 23   CREATININE 2.15* 1.92* 1.98*  CALCIUM 8.4* 7.9* 8.2*   Liver Function Tests: No results for input(s): "AST", "ALT", "ALKPHOS", "BILITOT", "PROT", "ALBUMIN" in the last 168 hours. No results for input(s): "LIPASE", "AMYLASE" in the last 168 hours. No results for input(s): "AMMONIA" in the last 168 hours. CBC: Recent Labs  Lab 06/19/23 1521 06/20/23 0914  WBC 8.1 6.8  HGB 10.7* 10.4*  HCT 34.3* 33.4*  MCV 82.5 79.7*  PLT 236 202   Cardiac Enzymes: No results for input(s): "CKTOTAL", "CKMB", "CKMBINDEX", "TROPONINI" in the last 168 hours. BNP: Invalid input(s): "POCBNP" CBG: Recent Labs  Lab 06/21/23 2102 06/22/23 0636  GLUCAP 91 86   D-Dimer No results for input(s): "DDIMER" in the last 72 hours. Hgb A1c No results for input(s): "HGBA1C" in the last 72 hours. Lipid Profile No results for input(s): "CHOL", "HDL", "LDLCALC", "TRIG", "CHOLHDL", "LDLDIRECT" in the last 72 hours. Thyroid function studies No results for input(s): "TSH", "T4TOTAL", "T3FREE", "THYROIDAB" in the last 72 hours.  Invalid input(s): "FREET3" Anemia work up No results for input(s): "VITAMINB12", "FOLATE", "FERRITIN", "TIBC", "IRON", "RETICCTPCT" in the last 72 hours. Urinalysis    Component Value Date/Time   COLORURINE YELLOW 04/08/2022 1317   APPEARANCEUR Cloudy (A) 06/03/2023 0000   LABSPEC 1.017 04/08/2022 1317   PHURINE 7.0 04/08/2022 1317   GLUCOSEU Negative 06/03/2023 0000   HGBUR NEGATIVE 04/08/2022 1317   BILIRUBINUR 1+ (A) 06/03/2023 0000   KETONESUR NEGATIVE 04/08/2022 1317   PROTEINUR 2+ (A) 06/03/2023 0000   PROTEINUR 100 (A) 04/08/2022 1317   UROBILINOGEN 0.2 02/07/2013 1859   NITRITE Negative 06/03/2023 0000   NITRITE NEGATIVE 04/08/2022 1317   LEUKOCYTESUR Negative 06/03/2023 0000   LEUKOCYTESUR NEGATIVE 04/08/2022 1317   Sepsis Labs Recent Labs  Lab 06/19/23 1521 06/20/23 0914  WBC 8.1 6.8   Microbiology Recent Results (from the past 240 hour(s))   SARS Coronavirus 2 by RT PCR (hospital order, performed in [facility] hospital lab) *cepheid single result test* Anterior Nasal Swab     Status: None   Collection Time: 06/19/23  8:58 PM   Specimen: Anterior Nasal Swab  Result Value Ref Range Status   SARS Coronavirus 2 by RT PCR NEGATIVE NEGATIVE Final    Comment: Performed at [facility], [address], [place], [place] [address]    Procedures/Studies: DG Swallowing Func-Speech Pathology  Result Date: 06/20/2023 Table formatting from the original result was not included. Modified Barium Swallow Study Patient Details Name: Tara [name] MRN: [id number] Date of Birth: [birth year] Today's Date: 06/20/2023 HPI/PMH: HPI: 77 y.o. female presenting 9/27 with cough and SHOB. CXR positive for retrocardiac opacity / nodularity. Pt has had x3 asp PNA in the past year and is on a mech soft diet at home. Pt complains on coughing with thins during meals and after meals. Pt with h/x of GI Workup for AKI, CAP. PMH: aspiration PNA, a fib,  seizure disorder s/p resection of meningioma 2023, breast cancer. Clinical Impression: Pt seen for MBSS to determine safest diet recs. Pt assessed in the lateral position and assessed with thins via straw, puree via tsp, regular solids, and barium tablet. Pt took sips of thin via straw, x2 small sips and x1 sequential sips with no aspiration/penetration. The pt's swallow trigger was at the level of the pyriform sinuses, pt had partial distention and obstruction of flow for thin  CL 106 112* 110  CO2 21* 19* 18*  GLUCOSE 98 97  79  BUN 22 21 23   CREATININE 2.15* 1.92* 1.98*  CALCIUM 8.4* 7.9* 8.2*   Liver Function Tests: No results for input(s): "AST", "ALT", "ALKPHOS", "BILITOT", "PROT", "ALBUMIN" in the last 168 hours. No results for input(s): "LIPASE", "AMYLASE" in the last 168 hours. No results for input(s): "AMMONIA" in the last 168 hours. CBC: Recent Labs  Lab 06/19/23 1521 06/20/23 0914  WBC 8.1 6.8  HGB 10.7* 10.4*  HCT 34.3* 33.4*  MCV 82.5 79.7*  PLT 236 202   Cardiac Enzymes: No results for input(s): "CKTOTAL", "CKMB", "CKMBINDEX", "TROPONINI" in the last 168 hours. BNP: Invalid input(s): "POCBNP" CBG: Recent Labs  Lab 06/21/23 2102 06/22/23 0636  GLUCAP 91 86   D-Dimer No results for input(s): "DDIMER" in the last 72 hours. Hgb A1c No results for input(s): "HGBA1C" in the last 72 hours. Lipid Profile No results for input(s): "CHOL", "HDL", "LDLCALC", "TRIG", "CHOLHDL", "LDLDIRECT" in the last 72 hours. Thyroid function studies No results for input(s): "TSH", "T4TOTAL", "T3FREE", "THYROIDAB" in the last 72 hours.  Invalid input(s): "FREET3" Anemia work up No results for input(s): "VITAMINB12", "FOLATE", "FERRITIN", "TIBC", "IRON", "RETICCTPCT" in the last 72 hours. Urinalysis    Component Value Date/Time   COLORURINE YELLOW 04/08/2022 1317   APPEARANCEUR Cloudy (A) 06/03/2023 0000   LABSPEC 1.017 04/08/2022 1317   PHURINE 7.0 04/08/2022 1317   GLUCOSEU Negative 06/03/2023 0000   HGBUR NEGATIVE 04/08/2022 1317   BILIRUBINUR 1+ (A) 06/03/2023 0000   KETONESUR NEGATIVE 04/08/2022 1317   PROTEINUR 2+ (A) 06/03/2023 0000   PROTEINUR 100 (A) 04/08/2022 1317   UROBILINOGEN 0.2 02/07/2013 1859   NITRITE Negative 06/03/2023 0000   NITRITE NEGATIVE 04/08/2022 1317   LEUKOCYTESUR Negative 06/03/2023 0000   LEUKOCYTESUR NEGATIVE 04/08/2022 1317   Sepsis Labs Recent Labs  Lab 06/19/23 1521 06/20/23 0914  WBC 8.1 6.8   Microbiology Recent Results (from the past 240 hour(s))   SARS Coronavirus 2 by RT PCR (hospital order, performed in [facility] hospital lab) *cepheid single result test* Anterior Nasal Swab     Status: None   Collection Time: 06/19/23  8:58 PM   Specimen: Anterior Nasal Swab  Result Value Ref Range Status   SARS Coronavirus 2 by RT PCR NEGATIVE NEGATIVE Final    Comment: Performed at [facility], [address], [place], [place] [address]    Procedures/Studies: DG Swallowing Func-Speech Pathology  Result Date: 06/20/2023 Table formatting from the original result was not included. Modified Barium Swallow Study Patient Details Name: Tara [name] MRN: [id number] Date of Birth: [birth year] Today's Date: 06/20/2023 HPI/PMH: HPI: 77 y.o. female presenting 9/27 with cough and SHOB. CXR positive for retrocardiac opacity / nodularity. Pt has had x3 asp PNA in the past year and is on a mech soft diet at home. Pt complains on coughing with thins during meals and after meals. Pt with h/x of GI Workup for AKI, CAP. PMH: aspiration PNA, a fib,  seizure disorder s/p resection of meningioma 2023, breast cancer. Clinical Impression: Pt seen for MBSS to determine safest diet recs. Pt assessed in the lateral position and assessed with thins via straw, puree via tsp, regular solids, and barium tablet. Pt took sips of thin via straw, x2 small sips and x1 sequential sips with no aspiration/penetration. The pt's swallow trigger was at the level of the pyriform sinuses, pt had partial distention and obstruction of flow for thin  CL 106 112* 110  CO2 21* 19* 18*  GLUCOSE 98 97  79  BUN 22 21 23   CREATININE 2.15* 1.92* 1.98*  CALCIUM 8.4* 7.9* 8.2*   Liver Function Tests: No results for input(s): "AST", "ALT", "ALKPHOS", "BILITOT", "PROT", "ALBUMIN" in the last 168 hours. No results for input(s): "LIPASE", "AMYLASE" in the last 168 hours. No results for input(s): "AMMONIA" in the last 168 hours. CBC: Recent Labs  Lab 06/19/23 1521 06/20/23 0914  WBC 8.1 6.8  HGB 10.7* 10.4*  HCT 34.3* 33.4*  MCV 82.5 79.7*  PLT 236 202   Cardiac Enzymes: No results for input(s): "CKTOTAL", "CKMB", "CKMBINDEX", "TROPONINI" in the last 168 hours. BNP: Invalid input(s): "POCBNP" CBG: Recent Labs  Lab 06/21/23 2102 06/22/23 0636  GLUCAP 91 86   D-Dimer No results for input(s): "DDIMER" in the last 72 hours. Hgb A1c No results for input(s): "HGBA1C" in the last 72 hours. Lipid Profile No results for input(s): "CHOL", "HDL", "LDLCALC", "TRIG", "CHOLHDL", "LDLDIRECT" in the last 72 hours. Thyroid function studies No results for input(s): "TSH", "T4TOTAL", "T3FREE", "THYROIDAB" in the last 72 hours.  Invalid input(s): "FREET3" Anemia work up No results for input(s): "VITAMINB12", "FOLATE", "FERRITIN", "TIBC", "IRON", "RETICCTPCT" in the last 72 hours. Urinalysis    Component Value Date/Time   COLORURINE YELLOW 04/08/2022 1317   APPEARANCEUR Cloudy (A) 06/03/2023 0000   LABSPEC 1.017 04/08/2022 1317   PHURINE 7.0 04/08/2022 1317   GLUCOSEU Negative 06/03/2023 0000   HGBUR NEGATIVE 04/08/2022 1317   BILIRUBINUR 1+ (A) 06/03/2023 0000   KETONESUR NEGATIVE 04/08/2022 1317   PROTEINUR 2+ (A) 06/03/2023 0000   PROTEINUR 100 (A) 04/08/2022 1317   UROBILINOGEN 0.2 02/07/2013 1859   NITRITE Negative 06/03/2023 0000   NITRITE NEGATIVE 04/08/2022 1317   LEUKOCYTESUR Negative 06/03/2023 0000   LEUKOCYTESUR NEGATIVE 04/08/2022 1317   Sepsis Labs Recent Labs  Lab 06/19/23 1521 06/20/23 0914  WBC 8.1 6.8   Microbiology Recent Results (from the past 240 hour(s))   SARS Coronavirus 2 by RT PCR (hospital order, performed in [facility] hospital lab) *cepheid single result test* Anterior Nasal Swab     Status: None   Collection Time: 06/19/23  8:58 PM   Specimen: Anterior Nasal Swab  Result Value Ref Range Status   SARS Coronavirus 2 by RT PCR NEGATIVE NEGATIVE Final    Comment: Performed at [facility], [address], [place], [place] [address]    Procedures/Studies: DG Swallowing Func-Speech Pathology  Result Date: 06/20/2023 Table formatting from the original result was not included. Modified Barium Swallow Study Patient Details Name: Tara [name] MRN: [id number] Date of Birth: [birth year] Today's Date: 06/20/2023 HPI/PMH: HPI: 77 y.o. female presenting 9/27 with cough and SHOB. CXR positive for retrocardiac opacity / nodularity. Pt has had x3 asp PNA in the past year and is on a mech soft diet at home. Pt complains on coughing with thins during meals and after meals. Pt with h/x of GI Workup for AKI, CAP. PMH: aspiration PNA, a fib,  seizure disorder s/p resection of meningioma 2023, breast cancer. Clinical Impression: Pt seen for MBSS to determine safest diet recs. Pt assessed in the lateral position and assessed with thins via straw, puree via tsp, regular solids, and barium tablet. Pt took sips of thin via straw, x2 small sips and x1 sequential sips with no aspiration/penetration. The pt's swallow trigger was at the level of the pyriform sinuses, pt had partial distention and obstruction of flow for thin  CL 106 112* 110  CO2 21* 19* 18*  GLUCOSE 98 97  79  BUN 22 21 23   CREATININE 2.15* 1.92* 1.98*  CALCIUM 8.4* 7.9* 8.2*   Liver Function Tests: No results for input(s): "AST", "ALT", "ALKPHOS", "BILITOT", "PROT", "ALBUMIN" in the last 168 hours. No results for input(s): "LIPASE", "AMYLASE" in the last 168 hours. No results for input(s): "AMMONIA" in the last 168 hours. CBC: Recent Labs  Lab 06/19/23 1521 06/20/23 0914  WBC 8.1 6.8  HGB 10.7* 10.4*  HCT 34.3* 33.4*  MCV 82.5 79.7*  PLT 236 202   Cardiac Enzymes: No results for input(s): "CKTOTAL", "CKMB", "CKMBINDEX", "TROPONINI" in the last 168 hours. BNP: Invalid input(s): "POCBNP" CBG: Recent Labs  Lab 06/21/23 2102 06/22/23 0636  GLUCAP 91 86   D-Dimer No results for input(s): "DDIMER" in the last 72 hours. Hgb A1c No results for input(s): "HGBA1C" in the last 72 hours. Lipid Profile No results for input(s): "CHOL", "HDL", "LDLCALC", "TRIG", "CHOLHDL", "LDLDIRECT" in the last 72 hours. Thyroid function studies No results for input(s): "TSH", "T4TOTAL", "T3FREE", "THYROIDAB" in the last 72 hours.  Invalid input(s): "FREET3" Anemia work up No results for input(s): "VITAMINB12", "FOLATE", "FERRITIN", "TIBC", "IRON", "RETICCTPCT" in the last 72 hours. Urinalysis    Component Value Date/Time   COLORURINE YELLOW 04/08/2022 1317   APPEARANCEUR Cloudy (A) 06/03/2023 0000   LABSPEC 1.017 04/08/2022 1317   PHURINE 7.0 04/08/2022 1317   GLUCOSEU Negative 06/03/2023 0000   HGBUR NEGATIVE 04/08/2022 1317   BILIRUBINUR 1+ (A) 06/03/2023 0000   KETONESUR NEGATIVE 04/08/2022 1317   PROTEINUR 2+ (A) 06/03/2023 0000   PROTEINUR 100 (A) 04/08/2022 1317   UROBILINOGEN 0.2 02/07/2013 1859   NITRITE Negative 06/03/2023 0000   NITRITE NEGATIVE 04/08/2022 1317   LEUKOCYTESUR Negative 06/03/2023 0000   LEUKOCYTESUR NEGATIVE 04/08/2022 1317   Sepsis Labs Recent Labs  Lab 06/19/23 1521 06/20/23 0914  WBC 8.1 6.8   Microbiology Recent Results (from the past 240 hour(s))   SARS Coronavirus 2 by RT PCR (hospital order, performed in [facility] hospital lab) *cepheid single result test* Anterior Nasal Swab     Status: None   Collection Time: 06/19/23  8:58 PM   Specimen: Anterior Nasal Swab  Result Value Ref Range Status   SARS Coronavirus 2 by RT PCR NEGATIVE NEGATIVE Final    Comment: Performed at [facility], [address], [place], [place] [address]    Procedures/Studies: DG Swallowing Func-Speech Pathology  Result Date: 06/20/2023 Table formatting from the original result was not included. Modified Barium Swallow Study Patient Details Name: Tara [name] MRN: [id number] Date of Birth: [birth year] Today's Date: 06/20/2023 HPI/PMH: HPI: 77 y.o. female presenting 9/27 with cough and SHOB. CXR positive for retrocardiac opacity / nodularity. Pt has had x3 asp PNA in the past year and is on a mech soft diet at home. Pt complains on coughing with thins during meals and after meals. Pt with h/x of GI Workup for AKI, CAP. PMH: aspiration PNA, a fib,  seizure disorder s/p resection of meningioma 2023, breast cancer. Clinical Impression: Pt seen for MBSS to determine safest diet recs. Pt assessed in the lateral position and assessed with thins via straw, puree via tsp, regular solids, and barium tablet. Pt took sips of thin via straw, x2 small sips and x1 sequential sips with no aspiration/penetration. The pt's swallow trigger was at the level of the pyriform sinuses, pt had partial distention and obstruction of flow for thin  CL 106 112* 110  CO2 21* 19* 18*  GLUCOSE 98 97  79  BUN 22 21 23   CREATININE 2.15* 1.92* 1.98*  CALCIUM 8.4* 7.9* 8.2*   Liver Function Tests: No results for input(s): "AST", "ALT", "ALKPHOS", "BILITOT", "PROT", "ALBUMIN" in the last 168 hours. No results for input(s): "LIPASE", "AMYLASE" in the last 168 hours. No results for input(s): "AMMONIA" in the last 168 hours. CBC: Recent Labs  Lab 06/19/23 1521 06/20/23 0914  WBC 8.1 6.8  HGB 10.7* 10.4*  HCT 34.3* 33.4*  MCV 82.5 79.7*  PLT 236 202   Cardiac Enzymes: No results for input(s): "CKTOTAL", "CKMB", "CKMBINDEX", "TROPONINI" in the last 168 hours. BNP: Invalid input(s): "POCBNP" CBG: Recent Labs  Lab 06/21/23 2102 06/22/23 0636  GLUCAP 91 86   D-Dimer No results for input(s): "DDIMER" in the last 72 hours. Hgb A1c No results for input(s): "HGBA1C" in the last 72 hours. Lipid Profile No results for input(s): "CHOL", "HDL", "LDLCALC", "TRIG", "CHOLHDL", "LDLDIRECT" in the last 72 hours. Thyroid function studies No results for input(s): "TSH", "T4TOTAL", "T3FREE", "THYROIDAB" in the last 72 hours.  Invalid input(s): "FREET3" Anemia work up No results for input(s): "VITAMINB12", "FOLATE", "FERRITIN", "TIBC", "IRON", "RETICCTPCT" in the last 72 hours. Urinalysis    Component Value Date/Time   COLORURINE YELLOW 04/08/2022 1317   APPEARANCEUR Cloudy (A) 06/03/2023 0000   LABSPEC 1.017 04/08/2022 1317   PHURINE 7.0 04/08/2022 1317   GLUCOSEU Negative 06/03/2023 0000   HGBUR NEGATIVE 04/08/2022 1317   BILIRUBINUR 1+ (A) 06/03/2023 0000   KETONESUR NEGATIVE 04/08/2022 1317   PROTEINUR 2+ (A) 06/03/2023 0000   PROTEINUR 100 (A) 04/08/2022 1317   UROBILINOGEN 0.2 02/07/2013 1859   NITRITE Negative 06/03/2023 0000   NITRITE NEGATIVE 04/08/2022 1317   LEUKOCYTESUR Negative 06/03/2023 0000   LEUKOCYTESUR NEGATIVE 04/08/2022 1317   Sepsis Labs Recent Labs  Lab 06/19/23 1521 06/20/23 0914  WBC 8.1 6.8   Microbiology Recent Results (from the past 240 hour(s))   SARS Coronavirus 2 by RT PCR (hospital order, performed in [facility] hospital lab) *cepheid single result test* Anterior Nasal Swab     Status: None   Collection Time: 06/19/23  8:58 PM   Specimen: Anterior Nasal Swab  Result Value Ref Range Status   SARS Coronavirus 2 by RT PCR NEGATIVE NEGATIVE Final    Comment: Performed at [facility], [address], [place], [place] [address]    Procedures/Studies: DG Swallowing Func-Speech Pathology  Result Date: 06/20/2023 Table formatting from the original result was not included. Modified Barium Swallow Study Patient Details Name: Tara [name] MRN: [id number] Date of Birth: [birth year] Today's Date: 06/20/2023 HPI/PMH: HPI: 77 y.o. female presenting 9/27 with cough and SHOB. CXR positive for retrocardiac opacity / nodularity. Pt has had x3 asp PNA in the past year and is on a mech soft diet at home. Pt complains on coughing with thins during meals and after meals. Pt with h/x of GI Workup for AKI, CAP. PMH: aspiration PNA, a fib,  seizure disorder s/p resection of meningioma 2023, breast cancer. Clinical Impression: Pt seen for MBSS to determine safest diet recs. Pt assessed in the lateral position and assessed with thins via straw, puree via tsp, regular solids, and barium tablet. Pt took sips of thin via straw, x2 small sips and x1 sequential sips with no aspiration/penetration. The pt's swallow trigger was at the level of the pyriform sinuses, pt had partial distention and obstruction of flow for thin  CL 106 112* 110  CO2 21* 19* 18*  GLUCOSE 98 97  79  BUN 22 21 23   CREATININE 2.15* 1.92* 1.98*  CALCIUM 8.4* 7.9* 8.2*   Liver Function Tests: No results for input(s): "AST", "ALT", "ALKPHOS", "BILITOT", "PROT", "ALBUMIN" in the last 168 hours. No results for input(s): "LIPASE", "AMYLASE" in the last 168 hours. No results for input(s): "AMMONIA" in the last 168 hours. CBC: Recent Labs  Lab 06/19/23 1521 06/20/23 0914  WBC 8.1 6.8  HGB 10.7* 10.4*  HCT 34.3* 33.4*  MCV 82.5 79.7*  PLT 236 202   Cardiac Enzymes: No results for input(s): "CKTOTAL", "CKMB", "CKMBINDEX", "TROPONINI" in the last 168 hours. BNP: Invalid input(s): "POCBNP" CBG: Recent Labs  Lab 06/21/23 2102 06/22/23 0636  GLUCAP 91 86   D-Dimer No results for input(s): "DDIMER" in the last 72 hours. Hgb A1c No results for input(s): "HGBA1C" in the last 72 hours. Lipid Profile No results for input(s): "CHOL", "HDL", "LDLCALC", "TRIG", "CHOLHDL", "LDLDIRECT" in the last 72 hours. Thyroid function studies No results for input(s): "TSH", "T4TOTAL", "T3FREE", "THYROIDAB" in the last 72 hours.  Invalid input(s): "FREET3" Anemia work up No results for input(s): "VITAMINB12", "FOLATE", "FERRITIN", "TIBC", "IRON", "RETICCTPCT" in the last 72 hours. Urinalysis    Component Value Date/Time   COLORURINE YELLOW 04/08/2022 1317   APPEARANCEUR Cloudy (A) 06/03/2023 0000   LABSPEC 1.017 04/08/2022 1317   PHURINE 7.0 04/08/2022 1317   GLUCOSEU Negative 06/03/2023 0000   HGBUR NEGATIVE 04/08/2022 1317   BILIRUBINUR 1+ (A) 06/03/2023 0000   KETONESUR NEGATIVE 04/08/2022 1317   PROTEINUR 2+ (A) 06/03/2023 0000   PROTEINUR 100 (A) 04/08/2022 1317   UROBILINOGEN 0.2 02/07/2013 1859   NITRITE Negative 06/03/2023 0000   NITRITE NEGATIVE 04/08/2022 1317   LEUKOCYTESUR Negative 06/03/2023 0000   LEUKOCYTESUR NEGATIVE 04/08/2022 1317   Sepsis Labs Recent Labs  Lab 06/19/23 1521 06/20/23 0914  WBC 8.1 6.8   Microbiology Recent Results (from the past 240 hour(s))   SARS Coronavirus 2 by RT PCR (hospital order, performed in [facility] hospital lab) *cepheid single result test* Anterior Nasal Swab     Status: None   Collection Time: 06/19/23  8:58 PM   Specimen: Anterior Nasal Swab  Result Value Ref Range Status   SARS Coronavirus 2 by RT PCR NEGATIVE NEGATIVE Final    Comment: Performed at [facility], [address], [place], [place] [address]    Procedures/Studies: DG Swallowing Func-Speech Pathology  Result Date: 06/20/2023 Table formatting from the original result was not included. Modified Barium Swallow Study Patient Details Name: Tara [name] MRN: [id number] Date of Birth: [birth year] Today's Date: 06/20/2023 HPI/PMH: HPI: 77 y.o. female presenting 9/27 with cough and SHOB. CXR positive for retrocardiac opacity / nodularity. Pt has had x3 asp PNA in the past year and is on a mech soft diet at home. Pt complains on coughing with thins during meals and after meals. Pt with h/x of GI Workup for AKI, CAP. PMH: aspiration PNA, a fib,  seizure disorder s/p resection of meningioma 2023, breast cancer. Clinical Impression: Pt seen for MBSS to determine safest diet recs. Pt assessed in the lateral position and assessed with thins via straw, puree via tsp, regular solids, and barium tablet. Pt took sips of thin via straw, x2 small sips and x1 sequential sips with no aspiration/penetration. The pt's swallow trigger was at the level of the pyriform sinuses, pt had partial distention and obstruction of flow for thin  Physician Discharge Summary   Tara [name] [id number] DOB: 1946/03/27 DOA: 06/19/2023  PCP: Kerri Perches, MD  Admit date: 06/19/2023 Discharge date: 06/22/2023  Admitted From: Home Disposition:  Home Discharging physician: Lewie Chamber, MD Barriers to discharge: none  Recommendations at discharge: Repeat BMP; if renal function improved can resume prior keppra dose of 750 mg BID  Home Health: PT/OT  Discharge Condition: stable CODE STATUS: DNR Diet recommendation:  Diet Orders (From admission, onward)     Start     Ordered   06/19/23 2026  DIET DYS 3 Room service appropriate? Yes; Fluid consistency: Thin  Diet effective now       Question Answer Comment  Room service appropriate? Yes   Fluid consistency: Thin      06/19/23 2026            Hospital Course: Tara [name] is a 77 y.o. female with medical history significant of seizure disorder following resection of meningioma in June 2023, breast CA in 2008, PAF on eliquis and amiodarone, aspiration PNA x3 with history of tube feeds.  She has since been transitioned off tube feeds and started back on dysphagia diet's.   She developed cough and some SOB for the past 2 weeks now.  She was on outpatient antibiotics but presented due to persistence of symptoms.  CXR in epic on 9/11 was neg for PNA.  COVID, FLU, RSV were neg on 9/11.   CXR on admission was concerning for a small left retrocardiac opacity.  She was started on antibiotics due to concern for aspiration and SLP was consulted.  She also underwent modified barium swallow study.  Testing was negative for aspiration and she was continued on a dysphagia 3 diet.  Assessment and Plan: * AKI (acute kidney injury) (HCC) - Presumed prerenal - Creatinine 2.15 on admission - Improving some with fluids - need repeat BMP at follow up  Community acquired pneumonia of left lower lobe of lung Retrocardiac nodularity / opacity that could be c/w PNA and/or  aspiration - s/p rocephin and doxy; continue on doxy at discharge to complete course  - COVID, FLU, RSV were neg on 9/11  Hypoxia-resolved as of 06/22/2023 - Not on oxygen at home at baseline - Suspect in setting of underlying aspiration event - Still having desaturations with ambulation but this slowly improved.  No further desaturation on walk test on 06/22/2023  Dysphagia - H/o chronic dysphagia and prior aspiration pneumonias.  S/p PEG in past too - appreciate SLP eval. patient also underwent swallow study during this admission - No aspiration noted but recommended to continue on dysphagia 3 diet  Seizure disorder (HCC) Onset in 2023 following resection of meningioma. Looks like pt on Keppra 750 BID + Vimpat (Dose decreased of Vimpat to 100 BID after levels came back very high at Feb Neuro office visit). Cont Vimpat 100 BID Cont Keppra 750 BID>>>changing to 500 mg BID at discharge for renal function and if better at follow up, needs to be resumed back on prior dosing  Paroxysmal atrial fibrillation (HCC) Cont amiodarone and eliquis.  Chronic HFrEF (heart failure with reduced ejection fraction) (HCC) EF 40% Watch for development of volume overload with IVF being used to treat her AKI.    The patient's acute and chronic medical conditions were treated accordingly. On day of discharge, patient was felt deemed stable for discharge. Patient/family member advised to call PCP or come back to ER if needed.   Principal Diagnosis: AKI (acute kidney

## 2023-06-24 ENCOUNTER — Encounter (HOSPITAL_COMMUNITY): Payer: Self-pay

## 2023-06-24 ENCOUNTER — Other Ambulatory Visit: Payer: Self-pay

## 2023-06-24 ENCOUNTER — Emergency Department (HOSPITAL_COMMUNITY)
Admission: EM | Admit: 2023-06-24 | Discharge: 2023-06-25 | Disposition: A | Discharge status: Home / Self Care | Payer: Medicare Other

## 2023-06-24 ENCOUNTER — Emergency Department (HOSPITAL_COMMUNITY): Payer: Medicare Other

## 2023-06-24 ENCOUNTER — Telehealth: Payer: Self-pay | Admitting: Family Medicine

## 2023-06-24 DIAGNOSIS — I509 Heart failure, unspecified: Secondary | ICD-10-CM | POA: Insufficient documentation

## 2023-06-24 DIAGNOSIS — R0602 Shortness of breath: Secondary | ICD-10-CM | POA: Diagnosis not present

## 2023-06-24 DIAGNOSIS — I4891 Unspecified atrial fibrillation: Secondary | ICD-10-CM | POA: Insufficient documentation

## 2023-06-24 DIAGNOSIS — Z7901 Long term (current) use of anticoagulants: Secondary | ICD-10-CM | POA: Insufficient documentation

## 2023-06-24 DIAGNOSIS — R918 Other nonspecific abnormal finding of lung field: Secondary | ICD-10-CM | POA: Diagnosis not present

## 2023-06-24 DIAGNOSIS — J449 Chronic obstructive pulmonary disease, unspecified: Secondary | ICD-10-CM | POA: Diagnosis not present

## 2023-06-24 DIAGNOSIS — J90 Pleural effusion, not elsewhere classified: Secondary | ICD-10-CM | POA: Diagnosis not present

## 2023-06-24 DIAGNOSIS — I110 Hypertensive heart disease with heart failure: Secondary | ICD-10-CM | POA: Diagnosis not present

## 2023-06-24 LAB — I-STAT CHEM 8, ED
BUN: 31 mg/dL — ABNORMAL HIGH (ref 8–23)
Calcium, Ion: 0.86 mmol/L — CL (ref 1.15–1.40)
Chloride: 115 mmol/L — ABNORMAL HIGH (ref 98–111)
Creatinine, Ser: 1.6 mg/dL — ABNORMAL HIGH (ref 0.44–1.00)
Glucose, Bld: 71 mg/dL (ref 70–99)
HCT: 31 % — ABNORMAL LOW (ref 36.0–46.0)
Hemoglobin: 10.5 g/dL — ABNORMAL LOW (ref 12.0–15.0)
Potassium: 4 mmol/L (ref 3.5–5.1)
Sodium: 142 mmol/L (ref 135–145)
TCO2: 18 mmol/L — ABNORMAL LOW (ref 22–32)

## 2023-06-24 LAB — CBC
HCT: 31.1 % — ABNORMAL LOW (ref 36.0–46.0)
Hemoglobin: 9.8 g/dL — ABNORMAL LOW (ref 12.0–15.0)
MCH: 25.2 pg — ABNORMAL LOW (ref 26.0–34.0)
MCHC: 31.5 g/dL (ref 30.0–36.0)
MCV: 79.9 fL — ABNORMAL LOW (ref 80.0–100.0)
Platelets: 105 10*3/uL — ABNORMAL LOW (ref 150–400)
RBC: 3.89 MIL/uL (ref 3.87–5.11)
RDW: 19.9 % — ABNORMAL HIGH (ref 11.5–15.5)
WBC: 7.7 10*3/uL (ref 4.0–10.5)
nRBC: 0 % (ref 0.0–0.2)

## 2023-06-24 LAB — BRAIN NATRIURETIC PEPTIDE: B Natriuretic Peptide: 392.3 pg/mL — ABNORMAL HIGH (ref 0.0–100.0)

## 2023-06-24 MED ORDER — POTASSIUM CHLORIDE CRYS ER 20 MEQ PO TBCR
40.0000 meq | EXTENDED_RELEASE_TABLET | Freq: Once | ORAL | Status: AC
  Administered 2023-06-24: 40 meq via ORAL
  Filled 2023-06-24: qty 2

## 2023-06-24 MED ORDER — FUROSEMIDE 10 MG/ML IJ SOLN
40.0000 mg | Freq: Once | INTRAMUSCULAR | Status: AC
  Administered 2023-06-24: 40 mg via INTRAVENOUS
  Filled 2023-06-24: qty 4

## 2023-06-24 NOTE — ED Triage Notes (Signed)
Pt coming in after being admitted 2 days ago for similar issues. Husband reports that her weight has increased in the last 2 days by atleast 10lbs. There is swelling edema in her bilateral lower legs and she is spitting up clear sputum. No expression of chest pain at this time. She presents with a 88% RA O2 sat in to triage an placed on 2l

## 2023-06-24 NOTE — ED Provider Notes (Signed)
[place] EMERGENCY DEPARTMENT AT [facility] Provider Note   CSN: [id number] Arrival date & time: 06/24/23  1825     History {Add pertinent medical, surgical, social history, OB history to HPI:1} Chief Complaint  Patient presents with   Shortness of Breath    Tara [name] is a 77 y.o. female.  77 year old female with past medical history of atrial fibrillation on amiodarone and Eliquis as well as systolic CHF with ejection fraction of 40 to 45% presenting to the emergency department today with shortness of breath.  The patient has had some increased weight gain and shortness of breath this been going on since she got out of the hospital.  She is apparently had a 12 pound weight gain since before she came into the hospital.  She was recently admitted for pneumonia.  She completed Rocephin and is on doxycycline.  The patient continues to cough with clear and white phlegm.  She denies any fevers.   Shortness of Breath Associated symptoms: cough        Home Medications Prior to Admission medications   Medication Sig Start Date End Date Taking? Authorizing Provider  acetaminophen (TYLENOL) 500 MG tablet Place 500-1,000 mg into feeding tube every 6 (six) hours as needed for mild pain.    [provider]  amiodarone (PACERONE) 200 MG tablet TAKE 1 TABLET VIA G-TUBE ONCE DAILY. 05/29/23   Kerri Perches, MD  benzonatate (TESSALON) 100 MG capsule Take 1 capsule (100 mg total) by mouth 2 (two) times daily as needed for cough. 06/03/23   Kerri Perches, MD  clonazePAM (KLONOPIN) 0.5 MG tablet TAKE (1) TABLET BY MOUTH AT BEDTIME. 02/02/23   Kerri Perches, MD  docusate (COLACE) 50 MG/5ML liquid 5 ml via  tube daily 09/29/22   Kerri Perches, MD  doxycycline (VIBRA-TABS) 100 MG tablet Take 1 tablet (100 mg total) by mouth every 12 (twelve) hours for 4 days. 06/22/23 06/26/23  Lewie Chamber, MD  ELIQUIS 5 MG TABS tablet TAKE 1 TABLET VIA G-TUBE 2 TIMES  DAILY. Patient taking differently: Take 5 mg by mouth 2 (two) times daily. 05/29/23   Kerri Perches, MD  guaiFENesin-dextromethorphan [facility] DM) 100-10 MG/5ML syrup Half to on teaspoon at bedtime, as nded, for excessive cough 06/17/23   Gilmore Laroche, FNP  hydrocortisone (ANUSOL-HC) 2.5 % rectal cream Place 1 Application rectally 2 (two) times daily. 06/07/23   Kerri Perches, MD  hydrocortisone (ANUSOL-HC) 25 MG suppository Place 1 suppository (25 mg total) rectally 2 (two) times daily. 06/07/23   Kerri Perches, MD  Lacosamide 100 MG TABS TAKE 1 TABLET BYMOUTH IN THE MORNING AND AT BEDTIME 03/04/23   Windell Norfolk, MD  levETIRAcetam (KEPPRA) 500 MG tablet Take 1 tablet (500 mg total) by mouth 2 (two) times daily. 06/22/23   Lewie Chamber, MD  ondansetron (ZOFRAN) 4 MG tablet Take 1 tablet (4 mg total) by mouth every 8 (eight) hours as needed for nausea or vomiting. 06/17/23   Gilmore Laroche, FNP  pantoprazole (PROTONIX) 40 MG tablet TAKE (1) TABLET BY MOUTH TWICE DAILY. 05/29/23   Kerri Perches, MD  polyethylene glycol powder [facility]) 17 GM/SCOOP powder 17 gm via feeding tube daily 09/29/22   Kerri Perches, MD  Protein (FEEDING SUPPLEMENT, PROSOURCE TF20,) liquid Place 60 mLs into feeding tube daily. 08/22/22   Kerri Perches, MD  rosuvastatin (CRESTOR) 20 MG tablet TAKE ONE TABLET BY MOUTH ONCE DAILY. 05/29/23   Kerri Perches,  MD  sertraline (ZOLOFT) 25 MG tablet TAKE 1 TABLET BY MOUTH ONCE A DAY. 05/29/23   Kerri Perches, MD  UNABLE TO FIND Med Name: Prosource-TF 20 Liquid Place 60 MLS into feeding tube daily. 09/05/22   Kerri Perches, MD  UNABLE TO FIND Med Name: Gloves Size:Medium 09/05/22   Kerri Perches, MD  UNABLE TO FIND Med Name: Disposable Bed Pads Size: Bo Merino 09/05/22   Kerri Perches, MD  UNABLE TO FIND Med Name: Adult Pull up Briefs Size : L 09/05/22   Kerri Perches, MD      Allergies    Aspirin, Lisinopril, and  Sudafed [pseudoephedrine hcl]    Review of Systems   Review of Systems  Respiratory:  Positive for cough and shortness of breath.   Cardiovascular:  Positive for leg swelling.  All other systems reviewed and are negative.   Physical Exam Updated Vital Signs BP 112/70 (BP Location: Left Arm)   Pulse 85   Temp 98.2 F (36.8 C) (Oral)   Resp (!) 22   Ht 4\' 9"  (1.448 m)   Wt 74 kg   SpO2 95%   BMI 35.30 kg/m  Physical Exam Vitals and nursing note reviewed.   Gen: NAD Eyes: PERRL, EOMI HEENT: no oropharyngeal swelling Neck: trachea midline Resp: Diminished at bilateral lung bases Card: RRR, no murmurs, rubs, or gallops Abd: nontender, nondistended Extremities: no calf tenderness, 1+ pitting edema bilaterally Vascular: 2+ radial pulses bilaterally, 2+ DP pulses bilaterally Skin: no rashes Psyc: acting appropriately   ED Results / Procedures / Treatments   Labs (all labs ordered are listed, but only abnormal results are displayed) Labs Reviewed  BASIC METABOLIC PANEL  CBC  BRAIN NATRIURETIC PEPTIDE    EKG EKG Interpretation Date/Time:  Wednesday June 24 2023 18:30:27 EDT Ventricular Rate:  95 PR Interval:    QRS Duration:  168 QT Interval:  424 QTC Calculation: 532 R Axis:   156  Text Interpretation: Atrial fibrillation Left bundle branch block When compared with ECG of 19-Jun-2023 17:04, PREVIOUS ECG IS PRESENT Confirmed by Alvester Chou [phone]) on 06/24/2023 7:27:34 PM  Radiology DG Chest 2 View  Result Date: 06/24/2023 CLINICAL DATA:  Shortness of breath, COPD EXAM: CHEST - 2 VIEW COMPARISON:  Radiograph 06/19/2023 FINDINGS: Stable cardiomegaly. Aortic atherosclerotic calcification. Pulmonary vascular congestion. Calcified right hilar and mediastinal lymph nodes. Left lower lobe bronchial wall thickening and infiltrates. Small right pleural effusion. No pneumothorax. Surgical clips right axilla. IMPRESSION: 1. Similar findings of airspace disease in the  left lower lobe suspicious for pneumonia. Follow-up in 4 weeks is recommended to ensure resolution. 2. New small right pleural effusion. Electronically Signed   By: Minerva Fester M.D.   On: 06/24/2023 20:15    Procedures Procedures  {Document cardiac monitor, telemetry assessment procedure when appropriate:1}  Medications Ordered in ED Medications - No data to display  ED Course/ Medical Decision Making/ A&P   {   Click here for ABCD2, HEART and other calculatorsREFRESH Note before signing :1}                              Medical Decision Making 76 year old female with past medical history of CHF, seizures, and SVT presenting to the emergency department today with shortness of breath.  The patient does report weight gain with this.  There is concern for CHF.  I will further evaluate her here with basic labs as well as  an EKG, chest x-ray, and BNP for further evaluation.  She denies any chest pain at this time.  I will reevaluate for ultimate disposition.  Amount and/or Complexity of Data Reviewed Labs: ordered. Radiology: ordered.   ***  {Document critical care time when appropriate:1} {Document review of labs and clinical decision tools ie heart score, Chads2Vasc2 etc:1}  {Document your independent review of radiology images, and any outside records:1} {Document your discussion with family members, caretakers, and with consultants:1} {Document social determinants of health affecting pt's care:1} {Document your decision making why or why not admission, treatments were needed:1} Final Clinical Impression(s) / ED Diagnoses Final diagnoses:  None    Rx / DC Orders ED Discharge Orders     None

## 2023-06-24 NOTE — Telephone Encounter (Signed)
Patient daughter Fleet Contras called asking can provider send in some lasix for lower extremities  and left arm she has a lot of swelling , just was discharged from hospital yesterday. Call back # [phone] today. Pharmacy: [facility]

## 2023-06-24 NOTE — Telephone Encounter (Signed)
Patient daughter called needs follow up for [facility] discharged from hospital on 10.01.2024 no openings with Dr Lodema Hong. Can she be worked into her schedule call back # [phone].

## 2023-06-25 ENCOUNTER — Other Ambulatory Visit: Payer: Self-pay | Admitting: Family Medicine

## 2023-06-25 ENCOUNTER — Telehealth: Payer: Self-pay | Admitting: Family Medicine

## 2023-06-25 DIAGNOSIS — I428 Other cardiomyopathies: Secondary | ICD-10-CM

## 2023-06-25 MED ORDER — FUROSEMIDE 20 MG PO TABS: 20.0000 mg | ORAL_TABLET | Freq: Every day | ORAL | 0 refills | Status: DC

## 2023-06-25 NOTE — Telephone Encounter (Signed)
Daughter aware patient scheduled 07/01/23 @ 2:20

## 2023-06-25 NOTE — Addendum Note (Signed)
Addended by: Kerri Perches on: 06/25/2023 08:28 AM   Modules accepted: Orders

## 2023-06-25 NOTE — Telephone Encounter (Signed)
Marylene Land RN Development worker, international aid w. [facility] [phone])  FYI  Delay in start of care to 06/26/23  Patient request

## 2023-06-25 NOTE — Discharge Instructions (Addendum)
Your X-ray shows some fluid around the lungs.  Please take the lasix as prescribed and follow up with the cardiologist.  Return to the ER for worsening symptoms.   Keep legs elevated when sitting or lying down, use compression stockings daily.   Your potassium levels were okay today, no need for potassium supplement pill.   Be sure to eat plenty of fruits and vegetables and follow a heart healthy diet.

## 2023-06-25 NOTE — ED Notes (Signed)
Pt walked around room with walker. Lowest sat (without oxygen) 96%

## 2023-06-25 NOTE — Telephone Encounter (Signed)
Pls tell daughter that I refilled the 5 lasix tabs from ED, refer for urgent cardiology appt and set up toc with e next 7 to 10 days please, thanks

## 2023-06-25 NOTE — Telephone Encounter (Signed)
Patient back in ER

## 2023-06-25 NOTE — ED Provider Notes (Signed)
  Provider Note MRN:  [id number]  Arrival date & time: 06/25/23    ED Course and Medical Decision Making  Assumed care from Dr Rhae Hammock at shift change.  See note from prior team for complete details, in brief:  77 yo female Recent admit for PNA Weight gain at discharge, small pleural eff, pna unchanged but symptoms improved Given lasix, BNP + Ambulatory pulse ox pending   Plan per prior physician : trial of ambulation   She is feeling much better on recheck, she was able to ambulate without hypoxia.  Per plan of prior team will start Lasix for home, follow-up with cardiology.  Fluid restriction discussed with patient  The patient improved significantly and was discharged in stable condition. Detailed discussions were had with the patient regarding current findings, and need for close f/u with PCP or on call doctor. The patient has been instructed to return immediately if the symptoms worsen in any way for re-evaluation. Patient verbalized understanding and is in agreement with current care plan. All questions answered prior to discharge.    Procedures  Final Clinical Impressions(s) / ED Diagnoses     ICD-10-CM   1. Acute on chronic congestive heart failure, unspecified heart failure type (HCC)  I50.9 Ambulatory referral to Cardiology      ED Discharge Orders          Ordered    furosemide (LASIX) 20 MG tablet  Daily        06/25/23 0009    Ambulatory referral to Cardiology       Comments: If you have not heard from the Cardiology office within the next 72 hours please call [phone].   06/25/23 0009              Discharge Instructions      Your X-ray shows some fluid around the lungs.  Please take the lasix as prescribed and follow up with the cardiologist.  Return to the ER for worsening symptoms.        Sloan Leiter, DO 06/25/23 [phone]

## 2023-06-26 DIAGNOSIS — Z853 Personal history of malignant neoplasm of breast: Secondary | ICD-10-CM | POA: Diagnosis not present

## 2023-06-26 DIAGNOSIS — K5790 Diverticulosis of intestine, part unspecified, without perforation or abscess without bleeding: Secondary | ICD-10-CM | POA: Diagnosis not present

## 2023-06-26 DIAGNOSIS — G40909 Epilepsy, unspecified, not intractable, without status epilepticus: Secondary | ICD-10-CM | POA: Diagnosis not present

## 2023-06-26 DIAGNOSIS — K648 Other hemorrhoids: Secondary | ICD-10-CM | POA: Diagnosis not present

## 2023-06-26 DIAGNOSIS — R1310 Dysphagia, unspecified: Secondary | ICD-10-CM | POA: Diagnosis not present

## 2023-06-26 DIAGNOSIS — I480 Paroxysmal atrial fibrillation: Secondary | ICD-10-CM | POA: Diagnosis not present

## 2023-06-26 DIAGNOSIS — I471 Supraventricular tachycardia, unspecified: Secondary | ICD-10-CM | POA: Diagnosis not present

## 2023-06-26 DIAGNOSIS — Z7952 Long term (current) use of systemic steroids: Secondary | ICD-10-CM | POA: Diagnosis not present

## 2023-06-26 DIAGNOSIS — Z9181 History of falling: Secondary | ICD-10-CM | POA: Diagnosis not present

## 2023-06-26 DIAGNOSIS — Z7901 Long term (current) use of anticoagulants: Secondary | ICD-10-CM | POA: Diagnosis not present

## 2023-06-26 DIAGNOSIS — F419 Anxiety disorder, unspecified: Secondary | ICD-10-CM | POA: Diagnosis not present

## 2023-06-26 DIAGNOSIS — I5022 Chronic systolic (congestive) heart failure: Secondary | ICD-10-CM | POA: Diagnosis not present

## 2023-06-26 DIAGNOSIS — N179 Acute kidney failure, unspecified: Secondary | ICD-10-CM | POA: Diagnosis not present

## 2023-06-26 DIAGNOSIS — K219 Gastro-esophageal reflux disease without esophagitis: Secondary | ICD-10-CM | POA: Diagnosis not present

## 2023-06-26 DIAGNOSIS — I428 Other cardiomyopathies: Secondary | ICD-10-CM | POA: Diagnosis not present

## 2023-06-26 DIAGNOSIS — I110 Hypertensive heart disease with heart failure: Secondary | ICD-10-CM | POA: Diagnosis not present

## 2023-06-26 DIAGNOSIS — F32A Depression, unspecified: Secondary | ICD-10-CM | POA: Diagnosis not present

## 2023-06-26 DIAGNOSIS — E785 Hyperlipidemia, unspecified: Secondary | ICD-10-CM | POA: Diagnosis not present

## 2023-06-30 ENCOUNTER — Other Ambulatory Visit: Payer: Self-pay | Admitting: Family Medicine

## 2023-06-30 ENCOUNTER — Telehealth: Payer: Self-pay | Admitting: Family Medicine

## 2023-06-30 DIAGNOSIS — K080 Exfoliation of teeth due to systemic causes: Secondary | ICD-10-CM | POA: Diagnosis not present

## 2023-06-30 NOTE — Telephone Encounter (Signed)
Denzil Magnuson with [facility] in on patient  Verbal orders PT 1 x week 1 week  2 x week 3 weeks  1x week 3 weeks   Call back [phone]

## 2023-07-01 ENCOUNTER — Encounter: Payer: Self-pay | Admitting: Family Medicine

## 2023-07-01 ENCOUNTER — Telehealth: Payer: Self-pay | Admitting: Family Medicine

## 2023-07-01 ENCOUNTER — Ambulatory Visit: Payer: Medicare Other | Admitting: Family Medicine

## 2023-07-01 VITALS — BP 110/75 | HR 95 | Ht <= 58 in | Wt 162.0 lb

## 2023-07-01 DIAGNOSIS — K219 Gastro-esophageal reflux disease without esophagitis: Secondary | ICD-10-CM | POA: Diagnosis not present

## 2023-07-01 DIAGNOSIS — Z7901 Long term (current) use of anticoagulants: Secondary | ICD-10-CM | POA: Diagnosis not present

## 2023-07-01 DIAGNOSIS — I428 Other cardiomyopathies: Secondary | ICD-10-CM | POA: Diagnosis not present

## 2023-07-01 DIAGNOSIS — I471 Supraventricular tachycardia, unspecified: Secondary | ICD-10-CM | POA: Diagnosis not present

## 2023-07-01 DIAGNOSIS — K648 Other hemorrhoids: Secondary | ICD-10-CM | POA: Diagnosis not present

## 2023-07-01 DIAGNOSIS — I5022 Chronic systolic (congestive) heart failure: Secondary | ICD-10-CM | POA: Diagnosis not present

## 2023-07-01 DIAGNOSIS — E261 Secondary hyperaldosteronism: Secondary | ICD-10-CM | POA: Diagnosis not present

## 2023-07-01 DIAGNOSIS — I480 Paroxysmal atrial fibrillation: Secondary | ICD-10-CM | POA: Diagnosis not present

## 2023-07-01 DIAGNOSIS — G40909 Epilepsy, unspecified, not intractable, without status epilepticus: Secondary | ICD-10-CM | POA: Diagnosis not present

## 2023-07-01 DIAGNOSIS — R1310 Dysphagia, unspecified: Secondary | ICD-10-CM | POA: Diagnosis not present

## 2023-07-01 DIAGNOSIS — E785 Hyperlipidemia, unspecified: Secondary | ICD-10-CM | POA: Diagnosis not present

## 2023-07-01 DIAGNOSIS — N179 Acute kidney failure, unspecified: Secondary | ICD-10-CM

## 2023-07-01 DIAGNOSIS — I5042 Chronic combined systolic (congestive) and diastolic (congestive) heart failure: Secondary | ICD-10-CM | POA: Diagnosis not present

## 2023-07-01 DIAGNOSIS — K2100 Gastro-esophageal reflux disease with esophagitis, without bleeding: Secondary | ICD-10-CM

## 2023-07-01 DIAGNOSIS — R262 Difficulty in walking, not elsewhere classified: Secondary | ICD-10-CM | POA: Diagnosis not present

## 2023-07-01 DIAGNOSIS — Z7952 Long term (current) use of systemic steroids: Secondary | ICD-10-CM | POA: Diagnosis not present

## 2023-07-01 DIAGNOSIS — F419 Anxiety disorder, unspecified: Secondary | ICD-10-CM | POA: Diagnosis not present

## 2023-07-01 DIAGNOSIS — J189 Pneumonia, unspecified organism: Secondary | ICD-10-CM | POA: Diagnosis not present

## 2023-07-01 DIAGNOSIS — N184 Chronic kidney disease, stage 4 (severe): Secondary | ICD-10-CM | POA: Diagnosis not present

## 2023-07-01 DIAGNOSIS — J690 Pneumonitis due to inhalation of food and vomit: Secondary | ICD-10-CM

## 2023-07-01 DIAGNOSIS — I110 Hypertensive heart disease with heart failure: Secondary | ICD-10-CM | POA: Diagnosis not present

## 2023-07-01 DIAGNOSIS — K5790 Diverticulosis of intestine, part unspecified, without perforation or abscess without bleeding: Secondary | ICD-10-CM | POA: Diagnosis not present

## 2023-07-01 DIAGNOSIS — F32A Depression, unspecified: Secondary | ICD-10-CM | POA: Diagnosis not present

## 2023-07-01 DIAGNOSIS — Z09 Encounter for follow-up examination after completed treatment for conditions other than malignant neoplasm: Secondary | ICD-10-CM

## 2023-07-01 MED ORDER — FUROSEMIDE 20 MG PO TABS: ORAL_TABLET | ORAL | 0 refills | Status: DC

## 2023-07-01 MED ORDER — POTASSIUM CHLORIDE CRYS ER 20 MEQ PO TBCR: EXTENDED_RELEASE_TABLET | ORAL | 0 refills | Status: DC

## 2023-07-01 NOTE — Patient Instructions (Signed)
F/U as before, call if you need me sooner  Lasix and potassium to be taken every other day and I am asking Cardiology to assist with or take over dosing  You are referred to Nephrology  Cmp and EGFR today  Please get CXR on 07/22/2023 to re evaluate pneumonia  Thanks for choosing [facility], we consider it a privelige to serve you.   l

## 2023-07-01 NOTE — Telephone Encounter (Signed)
Tresa Endo called [facility] for pt appointment today, oxygen is dropping to low to mid 80's. Recover after couple of minutes of sitting. Kelly call back # [phone].

## 2023-07-01 NOTE — Telephone Encounter (Signed)
Verbal order given  

## 2023-07-02 ENCOUNTER — Encounter: Payer: Self-pay | Admitting: Neurology

## 2023-07-02 ENCOUNTER — Ambulatory Visit: Payer: Medicare Other | Admitting: Neurology

## 2023-07-02 ENCOUNTER — Other Ambulatory Visit: Payer: Self-pay

## 2023-07-02 VITALS — BP 136/74 | Ht <= 58 in | Wt 163.0 lb

## 2023-07-02 DIAGNOSIS — R251 Tremor, unspecified: Secondary | ICD-10-CM | POA: Diagnosis not present

## 2023-07-02 DIAGNOSIS — N1831 Chronic kidney disease, stage 3a: Secondary | ICD-10-CM

## 2023-07-02 DIAGNOSIS — D329 Benign neoplasm of meninges, unspecified: Secondary | ICD-10-CM | POA: Diagnosis not present

## 2023-07-02 DIAGNOSIS — R748 Abnormal levels of other serum enzymes: Secondary | ICD-10-CM

## 2023-07-02 DIAGNOSIS — G40909 Epilepsy, unspecified, not intractable, without status epilepticus: Secondary | ICD-10-CM

## 2023-07-02 LAB — CMP14+EGFR
ALT: 76 [IU]/L — ABNORMAL HIGH (ref 0–32)
AST: 90 [IU]/L — ABNORMAL HIGH (ref 0–40)
Albumin: 3.4 g/dL — ABNORMAL LOW (ref 3.8–4.8)
Alkaline Phosphatase: 169 [IU]/L — ABNORMAL HIGH (ref 44–121)
BUN/Creatinine Ratio: 14 (ref 12–28)
BUN: 24 mg/dL (ref 8–27)
Bilirubin Total: 0.6 mg/dL (ref 0.0–1.2)
CO2: 20 mmol/L (ref 20–29)
Calcium: 8.7 mg/dL (ref 8.7–10.3)
Chloride: 109 mmol/L — ABNORMAL HIGH (ref 96–106)
Creatinine, Ser: 1.77 mg/dL — ABNORMAL HIGH (ref 0.57–1.00)
Globulin, Total: 3.6 g/dL (ref 1.5–4.5)
Glucose: 83 mg/dL (ref 70–99)
Potassium: 3.2 mmol/L — ABNORMAL LOW (ref 3.5–5.2)
Sodium: 146 mmol/L — ABNORMAL HIGH (ref 134–144)
Total Protein: 7 g/dL (ref 6.0–8.5)
eGFR: 29 mL/min/{1.73_m2} — ABNORMAL LOW (ref >59)

## 2023-07-02 MED ORDER — LEVETIRACETAM 500 MG PO TABS: 500.0000 mg | ORAL_TABLET | Freq: Two times a day (BID) | ORAL | 3 refills | Status: DC

## 2023-07-02 MED ORDER — LACOSAMIDE 50 MG PO TABS: 50.0000 mg | ORAL_TABLET | Freq: Two times a day (BID) | ORAL | 2 refills | Status: DC

## 2023-07-02 NOTE — Patient Instructions (Addendum)
Decrease Keppra to 500 mg twice daily  Decrease back to 50 mg twice daily Continue other medication Continue to follow with PCP Return in 6 months or sooner if worse.

## 2023-07-02 NOTE — Progress Notes (Signed)
[facility]  PATIENT: Tara [name] DOB: [birth year]  REQUESTING CLINICIAN: Kerri Perches, MD HISTORY FROM: Patient and husband  REASON FOR VISIT: Seizure disorder    HISTORICAL  CHIEF COMPLAINT:  Chief Complaint  Patient presents with   Follow-up    Rm 13, sz f/u no sz, no missed medications, went to ER  for pneumonia, ER changed keppra dose, they are not sure why    INTERVAL HISTORY 07/02/2023 Patient presents today for follow-up, she is accompanied by husband and daughter. Last visit was in February, at that time I have decreased her Keppra from 1000 to 750 mg twice daily and Vimpat from 200 to 100 mg twice daily.  Since then she has not had any seizure or seizure like activity.  She was admitted to the hospital twice over the summer due to pneumonia and second time regarding fluid overload and worsening renal function.  At that time her Keppra was decreased to 500 mg twice daily.  Again no seizure or seizure activity.  Overall she is doing well. In term of bradykinesia and resting tremor, I obtained a DaTscan which was negative for parkinsonism.   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  Patient have a long standing history of meningioma, in June she presented with lethargy and was found to have new vasogenic edema in the adjacent frontal lobe.  The meningioma was resected.  Husband stated after resection of the meningioma she had seizures.  She had a total of 3 seizures and they are all described as generalized convulsion.  She is on Vimpat 200 mg twice daily and Keppra 750 mg twice daily but husband reported lethargy with the medicine and also reports shaking.  Husband stated whenever he gave him the medication he noted that patient shakes all over.  Currently he is on a wheelchair she is able to stand but  has difficulty with ambulation.She is doing home PT and OT.   Handedness: Right-handed  Onset: In June after resection of a meningioma  Seizure Type: Described as generalized convulsion  Current frequency: Total of 3 seizures  Any injuries from seizures: Denies  Seizure risk factors: Meningioma s/p resection  Previous ASMs: Levetiracetam and lacosamide  Currenty ASMs: Levetiracetam 750 mg twice daily and lacosamide 200 mg twice daily  ASMs side effects: Lethargy  Brain Images: Postsurgical change  Previous EEGs: Diffuse slowing   OTHER MEDICAL CONDITIONS: Meningioma s/p resection seizure disorder,  REVIEW OF SYSTEMS: Full 14 system review of systems performed and negative with exception of: As noted in the HPI   ALLERGIES: Allergies  Allergen Reactions   Aspirin Other (See Comments)    Reports GI bleed-   Lisinopril Cough   Sudafed [Pseudoephedrine Hcl]     Pt reports she had drainage in throat that made her throat hurt.     HOME MEDICATIONS: Outpatient Medications Prior to Visit  Medication Sig Dispense Refill   acetaminophen (TYLENOL) 500 MG tablet Place 500-1,000 mg into feeding tube every 6 (six) hours as needed for mild pain.     amiodarone (PACERONE) 200 MG tablet TAKE 1 TABLET VIA G-TUBE ONCE DAILY. 30 tablet 0   clonazePAM (KLONOPIN) 0.5 MG tablet TAKE (1) TABLET BY MOUTH AT BEDTIME. 30 tablet 0   hydrocortisone (ANUSOL-HC) 2.5 % rectal cream Place 1 Application rectally 2 (two) times daily. 30 g 3  hydrocortisone (ANUSOL-HC) 25 MG suppository Place 1 suppository (25 mg total) rectally 2 (two) times daily. 12 suppository 3   pantoprazole (PROTONIX) 40 MG tablet TAKE (1) TABLET BY MOUTH TWICE DAILY. 60 tablet 0   sertraline (ZOLOFT) 25 MG tablet TAKE 1 TABLET BY MOUTH ONCE A DAY. 30 tablet 0   Lacosamide 100 MG TABS TAKE 1 TABLET BYMOUTH IN THE MORNING AND AT BEDTIME 60 tablet 5   levETIRAcetam (KEPPRA) 500 MG tablet Take 1 tablet (500 mg total) by mouth 2  (two) times daily. 30 tablet 2   rosuvastatin (CRESTOR) 20 MG tablet TAKE ONE TABLET BY MOUTH ONCE DAILY. 30 tablet 0   benzonatate (TESSALON) 100 MG capsule Take 1 capsule (100 mg total) by mouth 2 (two) times daily as needed for cough. (Patient not taking: Reported on 07/02/2023) 20 capsule 0   ELIQUIS 5 MG TABS tablet TAKE 1 TABLET VIA G-TUBE 2 TIMES DAILY. (Patient taking differently: Take 5 mg by mouth 2 (two) times daily.) 60 tablet 0   furosemide (LASIX) 20 MG tablet Take one tablet every other day as needed, for leg swelling (Patient not taking: Reported on 07/02/2023) 15 tablet 0   ondansetron (ZOFRAN) 4 MG tablet Take 1 tablet (4 mg total) by mouth every 8 (eight) hours as needed for nausea or vomiting. (Patient not taking: Reported on 07/02/2023) 20 tablet 0   potassium chloride SA (KLOR-CON M) 20 MEQ tablet Take one tablet every other day , only on the days that you take furosemide (Patient not taking: Reported on 07/02/2023) 15 tablet 0   docusate (COLACE) 50 MG/5ML liquid 5 ml via  tube daily (Patient not taking: Reported on 07/02/2023) 100 mL 1   guaiFENesin-dextromethorphan (ROBITUSSIN DM) 100-10 MG/5ML syrup Half to on teaspoon at bedtime, as nded, for excessive cough (Patient not taking: Reported on 07/02/2023) 118 mL 0   No facility-administered medications prior to visit.    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   Anxiety    Breast cancer (HCC) 2008   right - s/p lumpectomy->chemo, radiation   Depression    Dysrhythmia    hx SVT   Family history of colon cancer    Family history of prostate cancer    GERD (gastroesophageal reflux disease)    H/O: hysterectomy    Hiatal hernia    History of cancer chemotherapy    History of radiation therapy    Hyperlipidemia    Hypertension 01/25/2018   Nonischemic cardiomyopathy (HCC)    Personal history of radiation therapy  01/05/209   SVT (supraventricular tachycardia) (HCC)    short RP SVT documented 5/14   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HCC)    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)   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    PAST SURGICAL HISTORY: Past Surgical History:  Procedure Laterality Date   ABDOMINAL HYSTERECTOMY  1994   fibroids,    BIOPSY  06/19/2016   Procedure: BIOPSY;  Surgeon: Corbin Ade, MD;  [facility];  Service: Endoscopy;;  gastric duodenum   BREAST BIOPSY Right 12/16/2006   malignant   BREAST EXCISIONAL BIOPSY Right 2018   benign lumpectomy   BREAST LUMPECTOMY Right 02/2007   BREAST LUMPECTOMY WITH RADIOACTIVE SEED LOCALIZATION Right 07/29/2017   Procedure: RIGHT BREAST LUMPECTOMY WITH RADIOACTIVE SEED LOCALIZATION;  Surgeon: Claud Kelp, MD;  Location: [facility] OR;  Service: General;  Laterality: Right;   BREAST SURGERY Right 2008   lumpectomy, cancer   CARDIAC CATHETERIZATION     CHOLECYSTECTOMY  1999   COLONOSCOPY  2008   Dr. Darrick Penna: multiple polyps. Path not available at time of visit.    COLONOSCOPY WITH PROPOFOL N/A 04/25/2014   Dr. Darrick Penna: Multiple tubular adenomas removed. Diverticulosis. Moderate internal hemorrhoids. Next colonoscopy planned for August 2018.   CRANIOTOMY Bilateral 03/24/2022   Procedure: Bifrontal craniotomy for resection of meningioma;  Surgeon: Coletta Memos, MD;  Location: [facility] OR;  Service: Neurosurgery;  Laterality: Bilateral;   ESOPHAGOGASTRODUODENOSCOPY (EGD) WITH PROPOFOL N/A 06/19/2016   Procedure: ESOPHAGOGASTRODUODENOSCOPY (EGD) WITH PROPOFOL;  Surgeon: Corbin Ade, MD;  [facility]: [facility];  Service: Endoscopy;  Laterality: N/A;   MASTECTOMY W/ SENTINEL NODE BIOPSY Right 06/09/2018   Procedure: RIGHT TOTAL MASTECTOMY WITH SENTINEL LYMPH NODE BIOPSY;  Surgeon: Claud Kelp, MD;  Location: [facility] OR;  Service: General;  Laterality: Right;   POLYPECTOMY N/A 04/25/2014   Procedure:  POLYPECTOMY;  Surgeon: West Bali, MD;  Location: [facility];  Service: Endoscopy;  Laterality: N/A;  Ascending and Decending Colon x3 , Transverse colon x2, rectal   PORTACATH PLACEMENT N/A 06/09/2018   Procedure: INSERTION PORT-A-CATH;  Surgeon: Claud Kelp, MD;  Location: [facility] OR;  Service: General;  Laterality: N/A;   RIGHT/LEFT HEART CATH AND CORONARY ANGIOGRAPHY N/A 07/10/2017   Procedure: RIGHT/LEFT HEART CATH AND CORONARY ANGIOGRAPHY;  Surgeon: Laurey Morale, MD;  Location: [facility] INVASIVE CV LAB;  Service: Cardiovascular;  Laterality: N/A;    FAMILY HISTORY: Family History  Problem Relation Age of Onset   Dementia Mother    Stroke Mother 73       left hemiparesis   Colon cancer Father 75       died at age 68   Breast cancer Sister    Hypertension Sister    Heart disease Sister    Cancer Sister        unknown form   Cancer Paternal Aunt        NOS   Other Paternal Aunt        old age   Lung cancer Paternal Uncle    Other Paternal Uncle        lightning strike   Other Paternal Uncle        hit by train   Cancer Cousin        NOS pat first cousin   Cancer Cousin        NOS pat first cousin   Prostate cancer Cousin        pat first cousin   Cancer Brother 60       prostate    SOCIAL HISTORY: Social History   Socioeconomic History   Marital status: Married    Spouse name: Not on file   Number of children: 2   Years of education: Not on file   Highest education level: Not on file  Occupational History   Occupation: disabled     Employer: UNEMPLOYED  Tobacco Use   Smoking status: Never   Smokeless tobacco: Never  Vaping Use   Vaping status: Never Used  Substance and Sexual Activity   Alcohol use: No   Drug use: No   Sexual activity: Not on file  Other Topics Concern   Not on file  Social History Narrative   Lives in [place] with family.  Does not routinely exercise.  Right handed   Caffeine-none   Social Determinants of Health   Financial  Resource Strain: Low Risk  (07/07/2022)   Received from [facility], [facility]   Overall Financial Resource Strain (CARDIA)    Difficulty of Paying Living Expenses: Not hard at all  Food Insecurity: Patient Declined (06/19/2023)   Hunger Vital Sign    Worried About Running Out of Food in the Last Year: Patient declined    Ran Out of Food in the Last Year: Patient declined  Transportation Needs: Patient Declined (06/19/2023)   PRAPARE - Administrator, Civil Service (Medical): Patient declined    Lack of Transportation (Non-Medical): Patient declined  Physical Activity: Insufficiently Active (05/01/2021)   Exercise Vital Sign    Days of Exercise per Week: 3 days    Minutes of Exercise per Session: 30 min  Stress: No Stress Concern Present (07/07/2022)   Received from [facility], [facility]   [employer] of Occupational Health - Occupational Stress Questionnaire    Feeling of Stress : Not at all  Social Connections: Unknown (07/06/2022)   Received from [facility], [facility]   Social Network    Social Network: Not on file  Intimate Partner Violence: Patient Declined (06/19/2023)   Humiliation, Afraid, Rape, and Kick questionnaire    Fear of Current or Ex-Partner: Patient declined    Emotionally Abused: Patient declined    Physically Abused: Patient declined    Sexually Abused: Patient declined    PHYSICAL EXAM  GENERAL EXAM/CONSTITUTIONAL: Vitals:  Vitals:   07/02/23 1155  BP: 136/74  Weight: 163 lb (73.9 kg)  Height: 4\' 9"  (1.448 m)   Body mass index is 35.27 kg/m. Wt Readings from Last 3 Encounters:  07/02/23 163 lb (73.9 kg)  07/01/23 162 lb (73.5 kg)  06/24/23 163 lb 2.3 oz (74 kg)   Patient is in no distress; well developed, nourished and groomed; neck is supple, sitting in her wheelchair  MUSCULOSKELETAL: Gait, strength, tone, movements noted in Neurologic exam below  NEUROLOGIC: MENTAL STATUS:     05/01/2021   11:27 AM   MMSE - Mini Mental State Exam  Not completed: Unable to complete   awake, alert, oriented to person, place and time recent and remote memory intact normal attention and concentration language fluent, comprehension intact, naming intact fund of knowledge appropriate  CRANIAL NERVE:  2nd, 3rd, 4th, 6th - Visual fields full to confrontation, extraocular muscles intact, no nystagmus 5th - facial sensation symmetric 7th - facial strength symmetric 8th - hearing intact 9th - palate elevates symmetrically, uvula midline 11th - shoulder shrug symmetric 12th - tongue protrusion midline  MOTOR:  normal bulk and tone, full strength in the BUE, BLE. She has bradykinesia   SENSORY:  normal and symmetric to light touch  COORDINATION:  finger-nose-finger, fine finger movements normal. There is bilateral resting tremors, left worse than right   GAIT/STATION:  Deferred    DIAGNOSTIC DATA (LABS, IMAGING, TESTING) - I reviewed patient records, labs, notes, testing and imaging myself where available.  Lab Results  Component Value Date   WBC 7.7 06/24/2023   HGB 10.5 (L) 06/24/2023   HCT 31.0 (L) 06/24/2023   MCV 79.9 (L) 06/24/2023   PLT 105 (L) 06/24/2023      Component Value Date/Time   NA 146 (H) 07/01/2023 1530   NA 143 10/12/2014 1001   K 3.2 (L) 07/01/2023 1530   K 3.9 10/12/2014 1001   CL 109 (H) 07/01/2023 1530  CO2 20 07/01/2023 1530   CO2 25 10/12/2014 1001   GLUCOSE 83 07/01/2023 1530   GLUCOSE 71 06/24/2023 2353   GLUCOSE 105 10/12/2014 1001   BUN 24 07/01/2023 1530   BUN 13.9 10/12/2014 1001   CREATININE 1.77 (H) 07/01/2023 1530   CREATININE 1.47 (H) 03/22/2020 1358   CREATININE 1.0 10/12/2014 1001   CALCIUM 8.7 07/01/2023 1530   CALCIUM 8.6 10/12/2014 1001   PROT 7.0 07/01/2023 1530   PROT 7.4 10/12/2014 1001   ALBUMIN 3.4 (L) 07/01/2023 1530   ALBUMIN 3.5 10/12/2014 1001   AST 90 (H) 07/01/2023 1530   AST 10 (L) 11/12/2018 1044   AST 33 10/12/2014  1001   ALT 76 (H) 07/01/2023 1530   ALT 6 11/12/2018 1044   ALT 27 10/12/2014 1001   ALKPHOS 169 (H) 07/01/2023 1530   ALKPHOS 102 10/12/2014 1001   BILITOT 0.6 07/01/2023 1530   BILITOT <0.2 (L) 11/12/2018 1044   BILITOT 0.25 10/12/2014 1001   GFRNONAA 26 (L) 06/22/2023 0722   GFRNONAA 35 (L) 03/22/2020 1358   GFRAA 43 (L) 08/07/2020 1013   GFRAA 40 (L) 03/22/2020 1358   Lab Results  Component Value Date   CHOL 178 12/31/2022   HDL 69 12/31/2022   LDLCALC 94 12/31/2022   TRIG 81 12/31/2022   Lab Results  Component Value Date   HGBA1C 6.2 (H) 06/03/2023   Lab Results  Component Value Date   VITAMINB12 356 04/11/2022   Lab Results  Component Value Date   TSH 2.420 12/31/2022    EEG 05/15/22 This study is suggestive moderate diffuse encephalopathy, nonspecific etiology.  No seizures or epileptiform discharges were seen throughout the recording.    MRI Brain 10/17/2022 1. 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 2. Diffuse pachymeningeal thickening and enhancement over both cerebral hemispheres, more prominent over the frontal lobes at the midline measuring up to 7 mm in thickness, favored to reflect evolving postoperative change. Recommend follow-up in 3-6 months to ensure stability.   MRI Brain 03/15/2022 Long-standing planum sphenoidale meningioma with slow growth. Current dimensions are 3 x 3 x 2 cm. Possibly related to the history of seizure, there is new vasogenic edema in the adjacent left frontal lobe.  DaTscan 12/04/2022 Ioflupane scan within normal limits. No reduced radiotracer activity in basal ganglia to suggest Parkinson's syndrome pathology.   ASSESSMENT AND PLAN  77 y.o. year old female  with meningioma s/p resection in June, seizure disorder, heart disease who is presenting for for follow-up for seizure.   Currently she is on Keppra 500 mg twice daily, recently decreased due to worsening kidney function and Vimpat 100 mg twice daily.  Denies any seizure or seizure activity.  Plan will be to continue patient on Keppra 500 mg twice daily and to decrease the Vimpat to 50 mg twice daily.  I will see her in 6 months for follow-up at that time if no seizures we will discontinue the Vimpat.  Interim of the bradykinesia and tremor, her DaTscan was negative for Parkinson pathology.  Will continue to observe her.  Continue to follow with PCP and return in 6 months or sooner if worse.    1. Seizure disorder (HCC)   2. Meningioma (HCC)   3. Tremor      Patient Instructions  Decrease Keppra to 500 mg twice daily  Decrease back to 50 mg twice  daily Continue other medication Continue to follow with PCP Return in 6 months or sooner if worse.   Per [facility] statutes, patients with seizures are not allowed to drive until they have been seizure-free for six months.  Other recommendations include using caution when using heavy equipment or power tools. Avoid working on ladders or at heights. Take showers instead of baths.  Do not swim alone.  Ensure the water temperature is not too high on the home water heater. Do not go swimming alone. Do not lock yourself in a room alone (i.e. bathroom). When caring for infants or small children, sit down when holding, feeding, or changing them to minimize risk of injury to the child in the event you have a seizure. Maintain good sleep hygiene. Avoid alcohol.  Also recommend adequate sleep, hydration, good diet and minimize stress.   During the Seizure  - First, ensure adequate ventilation and place patients on the floor on their left side  Loosen clothing around the neck and ensure the airway is patent. If the patient is clenching the teeth, do not force the mouth open with any object as this can cause severe damage - Remove all items from the surrounding that can  be hazardous. The patient may be oblivious to what's happening and may not even know what he or she is doing. If the patient is confused and wandering, either gently guide him/her away and block access to outside areas - Reassure the individual and be comforting - Call 911. In most cases, the seizure ends before EMS arrives. However, there are cases when seizures may last over 3 to 5 minutes. Or the individual may have developed breathing difficulties or severe injuries. If a pregnant patient or a person with diabetes develops a seizure, it is prudent to call an ambulance. - Finally, if the patient does not regain full consciousness, then call EMS. Most patients will remain confused for about 45 to 90 minutes after a seizure, so you must use judgment in calling for help. - Avoid restraints but make sure the patient is in a bed with padded side rails - Place the individual in a lateral position with the neck slightly flexed; this will help the saliva drain from the mouth and prevent the tongue from falling backward - Remove all nearby furniture and other hazards from the area - Provide verbal assurance as the individual is regaining consciousness - Provide the patient with privacy if possible - Call for help and start treatment as ordered by the caregiver   After the Seizure (Postictal Stage)  After a seizure, most patients experience confusion, fatigue, muscle pain and/or a headache. Thus, one should permit the individual to sleep. For the next few days, reassurance is essential. Being calm and helping reorient the person is also of importance.  Most seizures are painless and end spontaneously. Seizures are not harmful to others but can lead to complications such as stress on the lungs, brain and the heart. Individuals with prior lung problems may develop labored breathing and respiratory distress.     No orders of the defined types were placed in this encounter.   Meds ordered this encounter   Medications   lacosamide (VIMPAT) 50 MG TABS tablet    Sig: Take 1 tablet (50 mg total) by mouth 2 (two) times daily.    Dispense:  180 tablet    Refill:  2   levETIRAcetam (KEPPRA) 500 MG tablet    Sig: Take 1 tablet (500 mg  total) by mouth 2 (two) times daily.    Dispense:  180 tablet    Refill:  3    Return in about 6 months (around 12/31/2023).    Windell Norfolk, MD 07/03/2023, 8:10 AM  [facility] [address] [place], [place] [address] [phone]

## 2023-07-03 ENCOUNTER — Encounter: Payer: Self-pay | Admitting: Neurology

## 2023-07-03 DIAGNOSIS — I471 Supraventricular tachycardia, unspecified: Secondary | ICD-10-CM | POA: Diagnosis not present

## 2023-07-03 DIAGNOSIS — I110 Hypertensive heart disease with heart failure: Secondary | ICD-10-CM | POA: Diagnosis not present

## 2023-07-03 DIAGNOSIS — K219 Gastro-esophageal reflux disease without esophagitis: Secondary | ICD-10-CM | POA: Diagnosis not present

## 2023-07-03 DIAGNOSIS — E785 Hyperlipidemia, unspecified: Secondary | ICD-10-CM | POA: Diagnosis not present

## 2023-07-03 DIAGNOSIS — K648 Other hemorrhoids: Secondary | ICD-10-CM | POA: Diagnosis not present

## 2023-07-03 DIAGNOSIS — Z7901 Long term (current) use of anticoagulants: Secondary | ICD-10-CM | POA: Diagnosis not present

## 2023-07-03 DIAGNOSIS — K5790 Diverticulosis of intestine, part unspecified, without perforation or abscess without bleeding: Secondary | ICD-10-CM | POA: Diagnosis not present

## 2023-07-03 DIAGNOSIS — N179 Acute kidney failure, unspecified: Secondary | ICD-10-CM | POA: Diagnosis not present

## 2023-07-03 DIAGNOSIS — I480 Paroxysmal atrial fibrillation: Secondary | ICD-10-CM | POA: Diagnosis not present

## 2023-07-03 DIAGNOSIS — I5022 Chronic systolic (congestive) heart failure: Secondary | ICD-10-CM | POA: Diagnosis not present

## 2023-07-03 DIAGNOSIS — F419 Anxiety disorder, unspecified: Secondary | ICD-10-CM | POA: Diagnosis not present

## 2023-07-03 DIAGNOSIS — R1310 Dysphagia, unspecified: Secondary | ICD-10-CM | POA: Diagnosis not present

## 2023-07-03 DIAGNOSIS — G40909 Epilepsy, unspecified, not intractable, without status epilepticus: Secondary | ICD-10-CM | POA: Diagnosis not present

## 2023-07-03 DIAGNOSIS — I428 Other cardiomyopathies: Secondary | ICD-10-CM | POA: Diagnosis not present

## 2023-07-03 DIAGNOSIS — F32A Depression, unspecified: Secondary | ICD-10-CM | POA: Diagnosis not present

## 2023-07-03 DIAGNOSIS — Z7952 Long term (current) use of systemic steroids: Secondary | ICD-10-CM | POA: Diagnosis not present

## 2023-07-06 ENCOUNTER — Telehealth: Payer: Self-pay | Admitting: Family Medicine

## 2023-07-06 DIAGNOSIS — N179 Acute kidney failure, unspecified: Secondary | ICD-10-CM | POA: Diagnosis not present

## 2023-07-06 DIAGNOSIS — K5790 Diverticulosis of intestine, part unspecified, without perforation or abscess without bleeding: Secondary | ICD-10-CM | POA: Diagnosis not present

## 2023-07-06 DIAGNOSIS — E785 Hyperlipidemia, unspecified: Secondary | ICD-10-CM | POA: Diagnosis not present

## 2023-07-06 DIAGNOSIS — I110 Hypertensive heart disease with heart failure: Secondary | ICD-10-CM | POA: Diagnosis not present

## 2023-07-06 DIAGNOSIS — I428 Other cardiomyopathies: Secondary | ICD-10-CM | POA: Diagnosis not present

## 2023-07-06 DIAGNOSIS — I480 Paroxysmal atrial fibrillation: Secondary | ICD-10-CM | POA: Diagnosis not present

## 2023-07-06 DIAGNOSIS — Z7952 Long term (current) use of systemic steroids: Secondary | ICD-10-CM | POA: Diagnosis not present

## 2023-07-06 DIAGNOSIS — K648 Other hemorrhoids: Secondary | ICD-10-CM | POA: Diagnosis not present

## 2023-07-06 DIAGNOSIS — Z7901 Long term (current) use of anticoagulants: Secondary | ICD-10-CM | POA: Diagnosis not present

## 2023-07-06 DIAGNOSIS — F32A Depression, unspecified: Secondary | ICD-10-CM | POA: Diagnosis not present

## 2023-07-06 DIAGNOSIS — G40909 Epilepsy, unspecified, not intractable, without status epilepticus: Secondary | ICD-10-CM | POA: Diagnosis not present

## 2023-07-06 DIAGNOSIS — F419 Anxiety disorder, unspecified: Secondary | ICD-10-CM | POA: Diagnosis not present

## 2023-07-06 DIAGNOSIS — I471 Supraventricular tachycardia, unspecified: Secondary | ICD-10-CM | POA: Diagnosis not present

## 2023-07-06 DIAGNOSIS — R1310 Dysphagia, unspecified: Secondary | ICD-10-CM | POA: Diagnosis not present

## 2023-07-06 DIAGNOSIS — K219 Gastro-esophageal reflux disease without esophagitis: Secondary | ICD-10-CM | POA: Diagnosis not present

## 2023-07-06 DIAGNOSIS — I5022 Chronic systolic (congestive) heart failure: Secondary | ICD-10-CM | POA: Diagnosis not present

## 2023-07-06 NOTE — Telephone Encounter (Signed)
Rhunette Croft called from [place] home health needs plan of care for patient 1 time week for 4 weeks. Call back # [phone]

## 2023-07-08 DIAGNOSIS — I10 Essential (primary) hypertension: Secondary | ICD-10-CM | POA: Diagnosis not present

## 2023-07-08 DIAGNOSIS — N184 Chronic kidney disease, stage 4 (severe): Secondary | ICD-10-CM | POA: Diagnosis not present

## 2023-07-08 DIAGNOSIS — N179 Acute kidney failure, unspecified: Secondary | ICD-10-CM | POA: Diagnosis not present

## 2023-07-08 DIAGNOSIS — D631 Anemia in chronic kidney disease: Secondary | ICD-10-CM | POA: Diagnosis not present

## 2023-07-08 NOTE — Telephone Encounter (Signed)
Phone number no longer in service will wait for return call

## 2023-07-09 DIAGNOSIS — Z7952 Long term (current) use of systemic steroids: Secondary | ICD-10-CM | POA: Diagnosis not present

## 2023-07-09 DIAGNOSIS — K648 Other hemorrhoids: Secondary | ICD-10-CM | POA: Diagnosis not present

## 2023-07-09 DIAGNOSIS — F419 Anxiety disorder, unspecified: Secondary | ICD-10-CM | POA: Diagnosis not present

## 2023-07-09 DIAGNOSIS — F32A Depression, unspecified: Secondary | ICD-10-CM | POA: Diagnosis not present

## 2023-07-09 DIAGNOSIS — I480 Paroxysmal atrial fibrillation: Secondary | ICD-10-CM | POA: Diagnosis not present

## 2023-07-09 DIAGNOSIS — N179 Acute kidney failure, unspecified: Secondary | ICD-10-CM | POA: Diagnosis not present

## 2023-07-09 DIAGNOSIS — K5790 Diverticulosis of intestine, part unspecified, without perforation or abscess without bleeding: Secondary | ICD-10-CM | POA: Diagnosis not present

## 2023-07-09 DIAGNOSIS — R1310 Dysphagia, unspecified: Secondary | ICD-10-CM | POA: Diagnosis not present

## 2023-07-09 DIAGNOSIS — Z7901 Long term (current) use of anticoagulants: Secondary | ICD-10-CM | POA: Diagnosis not present

## 2023-07-09 DIAGNOSIS — I471 Supraventricular tachycardia, unspecified: Secondary | ICD-10-CM | POA: Diagnosis not present

## 2023-07-09 DIAGNOSIS — K219 Gastro-esophageal reflux disease without esophagitis: Secondary | ICD-10-CM | POA: Diagnosis not present

## 2023-07-09 DIAGNOSIS — E785 Hyperlipidemia, unspecified: Secondary | ICD-10-CM | POA: Diagnosis not present

## 2023-07-09 DIAGNOSIS — G40909 Epilepsy, unspecified, not intractable, without status epilepticus: Secondary | ICD-10-CM | POA: Diagnosis not present

## 2023-07-09 DIAGNOSIS — I428 Other cardiomyopathies: Secondary | ICD-10-CM | POA: Diagnosis not present

## 2023-07-09 DIAGNOSIS — I5022 Chronic systolic (congestive) heart failure: Secondary | ICD-10-CM | POA: Diagnosis not present

## 2023-07-09 DIAGNOSIS — I110 Hypertensive heart disease with heart failure: Secondary | ICD-10-CM | POA: Diagnosis not present

## 2023-07-10 DIAGNOSIS — K648 Other hemorrhoids: Secondary | ICD-10-CM | POA: Diagnosis not present

## 2023-07-10 DIAGNOSIS — F32A Depression, unspecified: Secondary | ICD-10-CM | POA: Diagnosis not present

## 2023-07-10 DIAGNOSIS — Z7901 Long term (current) use of anticoagulants: Secondary | ICD-10-CM | POA: Diagnosis not present

## 2023-07-10 DIAGNOSIS — I110 Hypertensive heart disease with heart failure: Secondary | ICD-10-CM | POA: Diagnosis not present

## 2023-07-10 DIAGNOSIS — I5022 Chronic systolic (congestive) heart failure: Secondary | ICD-10-CM | POA: Diagnosis not present

## 2023-07-10 DIAGNOSIS — R1310 Dysphagia, unspecified: Secondary | ICD-10-CM | POA: Diagnosis not present

## 2023-07-10 DIAGNOSIS — K5790 Diverticulosis of intestine, part unspecified, without perforation or abscess without bleeding: Secondary | ICD-10-CM | POA: Diagnosis not present

## 2023-07-10 DIAGNOSIS — Z7952 Long term (current) use of systemic steroids: Secondary | ICD-10-CM | POA: Diagnosis not present

## 2023-07-10 DIAGNOSIS — N179 Acute kidney failure, unspecified: Secondary | ICD-10-CM | POA: Diagnosis not present

## 2023-07-10 DIAGNOSIS — K219 Gastro-esophageal reflux disease without esophagitis: Secondary | ICD-10-CM | POA: Diagnosis not present

## 2023-07-10 DIAGNOSIS — E785 Hyperlipidemia, unspecified: Secondary | ICD-10-CM | POA: Diagnosis not present

## 2023-07-10 DIAGNOSIS — G40909 Epilepsy, unspecified, not intractable, without status epilepticus: Secondary | ICD-10-CM | POA: Diagnosis not present

## 2023-07-10 DIAGNOSIS — I480 Paroxysmal atrial fibrillation: Secondary | ICD-10-CM | POA: Diagnosis not present

## 2023-07-10 DIAGNOSIS — I428 Other cardiomyopathies: Secondary | ICD-10-CM | POA: Diagnosis not present

## 2023-07-10 DIAGNOSIS — I471 Supraventricular tachycardia, unspecified: Secondary | ICD-10-CM | POA: Diagnosis not present

## 2023-07-10 DIAGNOSIS — F419 Anxiety disorder, unspecified: Secondary | ICD-10-CM | POA: Diagnosis not present

## 2023-07-13 DIAGNOSIS — I5022 Chronic systolic (congestive) heart failure: Secondary | ICD-10-CM | POA: Diagnosis not present

## 2023-07-13 DIAGNOSIS — G40909 Epilepsy, unspecified, not intractable, without status epilepticus: Secondary | ICD-10-CM | POA: Diagnosis not present

## 2023-07-13 DIAGNOSIS — I480 Paroxysmal atrial fibrillation: Secondary | ICD-10-CM | POA: Diagnosis not present

## 2023-07-13 DIAGNOSIS — Z7901 Long term (current) use of anticoagulants: Secondary | ICD-10-CM | POA: Diagnosis not present

## 2023-07-13 DIAGNOSIS — I428 Other cardiomyopathies: Secondary | ICD-10-CM | POA: Diagnosis not present

## 2023-07-13 DIAGNOSIS — K5790 Diverticulosis of intestine, part unspecified, without perforation or abscess without bleeding: Secondary | ICD-10-CM | POA: Diagnosis not present

## 2023-07-13 DIAGNOSIS — I471 Supraventricular tachycardia, unspecified: Secondary | ICD-10-CM | POA: Diagnosis not present

## 2023-07-13 DIAGNOSIS — Z7952 Long term (current) use of systemic steroids: Secondary | ICD-10-CM | POA: Diagnosis not present

## 2023-07-13 DIAGNOSIS — F419 Anxiety disorder, unspecified: Secondary | ICD-10-CM | POA: Diagnosis not present

## 2023-07-13 DIAGNOSIS — I110 Hypertensive heart disease with heart failure: Secondary | ICD-10-CM | POA: Diagnosis not present

## 2023-07-13 DIAGNOSIS — R1310 Dysphagia, unspecified: Secondary | ICD-10-CM | POA: Diagnosis not present

## 2023-07-13 DIAGNOSIS — E785 Hyperlipidemia, unspecified: Secondary | ICD-10-CM | POA: Diagnosis not present

## 2023-07-13 DIAGNOSIS — F32A Depression, unspecified: Secondary | ICD-10-CM | POA: Diagnosis not present

## 2023-07-13 DIAGNOSIS — K648 Other hemorrhoids: Secondary | ICD-10-CM | POA: Diagnosis not present

## 2023-07-13 DIAGNOSIS — K219 Gastro-esophageal reflux disease without esophagitis: Secondary | ICD-10-CM | POA: Diagnosis not present

## 2023-07-13 DIAGNOSIS — N179 Acute kidney failure, unspecified: Secondary | ICD-10-CM | POA: Diagnosis not present

## 2023-07-15 DIAGNOSIS — K5790 Diverticulosis of intestine, part unspecified, without perforation or abscess without bleeding: Secondary | ICD-10-CM | POA: Diagnosis not present

## 2023-07-15 DIAGNOSIS — R1310 Dysphagia, unspecified: Secondary | ICD-10-CM | POA: Diagnosis not present

## 2023-07-15 DIAGNOSIS — I110 Hypertensive heart disease with heart failure: Secondary | ICD-10-CM | POA: Diagnosis not present

## 2023-07-15 DIAGNOSIS — I480 Paroxysmal atrial fibrillation: Secondary | ICD-10-CM | POA: Diagnosis not present

## 2023-07-15 DIAGNOSIS — N179 Acute kidney failure, unspecified: Secondary | ICD-10-CM | POA: Diagnosis not present

## 2023-07-15 DIAGNOSIS — I471 Supraventricular tachycardia, unspecified: Secondary | ICD-10-CM | POA: Diagnosis not present

## 2023-07-15 DIAGNOSIS — E785 Hyperlipidemia, unspecified: Secondary | ICD-10-CM | POA: Diagnosis not present

## 2023-07-15 DIAGNOSIS — F419 Anxiety disorder, unspecified: Secondary | ICD-10-CM | POA: Diagnosis not present

## 2023-07-15 DIAGNOSIS — I5022 Chronic systolic (congestive) heart failure: Secondary | ICD-10-CM | POA: Diagnosis not present

## 2023-07-15 DIAGNOSIS — Z7901 Long term (current) use of anticoagulants: Secondary | ICD-10-CM | POA: Diagnosis not present

## 2023-07-15 DIAGNOSIS — K219 Gastro-esophageal reflux disease without esophagitis: Secondary | ICD-10-CM | POA: Diagnosis not present

## 2023-07-15 DIAGNOSIS — G40909 Epilepsy, unspecified, not intractable, without status epilepticus: Secondary | ICD-10-CM | POA: Diagnosis not present

## 2023-07-15 DIAGNOSIS — Z7952 Long term (current) use of systemic steroids: Secondary | ICD-10-CM | POA: Diagnosis not present

## 2023-07-15 DIAGNOSIS — I428 Other cardiomyopathies: Secondary | ICD-10-CM | POA: Diagnosis not present

## 2023-07-15 DIAGNOSIS — K648 Other hemorrhoids: Secondary | ICD-10-CM | POA: Diagnosis not present

## 2023-07-15 DIAGNOSIS — F32A Depression, unspecified: Secondary | ICD-10-CM | POA: Diagnosis not present

## 2023-07-17 ENCOUNTER — Telehealth (HOSPITAL_COMMUNITY): Payer: Self-pay

## 2023-07-17 ENCOUNTER — Ambulatory Visit (HOSPITAL_COMMUNITY)
Admission: RE | Admit: 2023-07-17 | Discharge: 2023-07-17 | Disposition: A | Discharge status: Home / Self Care | Payer: Medicare Other | Source: Ambulatory Visit | Attending: Family Medicine | Admitting: Family Medicine

## 2023-07-17 ENCOUNTER — Encounter (HOSPITAL_COMMUNITY): Payer: Self-pay | Admitting: Cardiology

## 2023-07-17 ENCOUNTER — Ambulatory Visit (HOSPITAL_COMMUNITY)
Admission: RE | Admit: 2023-07-17 | Discharge: 2023-07-17 | Disposition: A | Discharge status: Home / Self Care | Payer: Medicare Other | Source: Ambulatory Visit | Attending: Cardiology | Admitting: Cardiology

## 2023-07-17 ENCOUNTER — Other Ambulatory Visit: Payer: Self-pay

## 2023-07-17 ENCOUNTER — Other Ambulatory Visit (HOSPITAL_COMMUNITY): Payer: Self-pay

## 2023-07-17 VITALS — BP 102/60 | HR 88 | Wt 160.4 lb

## 2023-07-17 DIAGNOSIS — I471 Supraventricular tachycardia, unspecified: Secondary | ICD-10-CM | POA: Insufficient documentation

## 2023-07-17 DIAGNOSIS — J189 Pneumonia, unspecified organism: Secondary | ICD-10-CM | POA: Diagnosis not present

## 2023-07-17 DIAGNOSIS — I4819 Other persistent atrial fibrillation: Secondary | ICD-10-CM | POA: Diagnosis not present

## 2023-07-17 DIAGNOSIS — I517 Cardiomegaly: Secondary | ICD-10-CM | POA: Diagnosis not present

## 2023-07-17 DIAGNOSIS — N184 Chronic kidney disease, stage 4 (severe): Secondary | ICD-10-CM | POA: Diagnosis not present

## 2023-07-17 DIAGNOSIS — Z7901 Long term (current) use of anticoagulants: Secondary | ICD-10-CM | POA: Insufficient documentation

## 2023-07-17 DIAGNOSIS — I10 Essential (primary) hypertension: Secondary | ICD-10-CM

## 2023-07-17 DIAGNOSIS — R918 Other nonspecific abnormal finding of lung field: Secondary | ICD-10-CM | POA: Diagnosis not present

## 2023-07-17 DIAGNOSIS — R7989 Other specified abnormal findings of blood chemistry: Secondary | ICD-10-CM | POA: Insufficient documentation

## 2023-07-17 DIAGNOSIS — I5022 Chronic systolic (congestive) heart failure: Secondary | ICD-10-CM | POA: Insufficient documentation

## 2023-07-17 DIAGNOSIS — N179 Acute kidney failure, unspecified: Secondary | ICD-10-CM | POA: Diagnosis not present

## 2023-07-17 DIAGNOSIS — I428 Other cardiomyopathies: Secondary | ICD-10-CM | POA: Diagnosis not present

## 2023-07-17 DIAGNOSIS — R9431 Abnormal electrocardiogram [ECG] [EKG]: Secondary | ICD-10-CM | POA: Diagnosis not present

## 2023-07-17 DIAGNOSIS — N183 Chronic kidney disease, stage 3 unspecified: Secondary | ICD-10-CM | POA: Insufficient documentation

## 2023-07-17 LAB — COMPREHENSIVE METABOLIC PANEL
ALT: 46 U/L — ABNORMAL HIGH (ref 0–44)
AST: 54 U/L — ABNORMAL HIGH (ref 15–41)
Albumin: 3.1 g/dL — ABNORMAL LOW (ref 3.5–5.0)
Alkaline Phosphatase: 112 U/L (ref 38–126)
Anion gap: 11 (ref 5–15)
BUN: 35 mg/dL — ABNORMAL HIGH (ref 8–23)
CO2: 20 mmol/L — ABNORMAL LOW (ref 22–32)
Calcium: 8.7 mg/dL — ABNORMAL LOW (ref 8.9–10.3)
Chloride: 112 mmol/L — ABNORMAL HIGH (ref 98–111)
Creatinine, Ser: 1.95 mg/dL — ABNORMAL HIGH (ref 0.44–1.00)
GFR, Estimated: 26 mL/min — ABNORMAL LOW (ref >60)
Glucose, Bld: 111 mg/dL — ABNORMAL HIGH (ref 70–99)
Potassium: 4.3 mmol/L (ref 3.5–5.1)
Sodium: 143 mmol/L (ref 135–145)
Total Bilirubin: 1 mg/dL (ref 0.3–1.2)
Total Protein: 7.4 g/dL (ref 6.5–8.1)

## 2023-07-17 LAB — TSH: TSH: 3.17 u[IU]/mL (ref 0.350–4.500)

## 2023-07-17 LAB — BRAIN NATRIURETIC PEPTIDE: B Natriuretic Peptide: 726.9 pg/mL — ABNORMAL HIGH (ref 0.0–100.0)

## 2023-07-17 MED ORDER — FUROSEMIDE 20 MG PO TABS: 40.0000 mg | ORAL_TABLET | Freq: Every day | ORAL | 11 refills | Status: DC

## 2023-07-17 MED ORDER — POTASSIUM CHLORIDE CRYS ER 20 MEQ PO TBCR: 40.0000 meq | EXTENDED_RELEASE_TABLET | Freq: Every day | ORAL | 11 refills | Status: DC

## 2023-07-17 MED ORDER — FARXIGA 10 MG PO TABS: 10.0000 mg | ORAL_TABLET | Freq: Every day | ORAL | 3 refills | Status: DC

## 2023-07-17 NOTE — Patient Instructions (Addendum)
START Farxiga 10 mg daily   INCREASE Lasix to 40 mg daily.  INCREASE potassium  40 mEq ( 2 tab) daily.  Labs done today, your results will be available in MyChart, we will contact you for abnormal readings.  Repeat blood work in 10 days.  Your physician has requested that you have an echocardiogram. Echocardiography is a painless test that uses sound waves to create images of your heart. It provides your doctor with information about the size and shape of your heart and how well your heart's chambers and valves are working. This procedure takes approximately one hour. There are no restrictions for this procedure. Please do NOT wear cologne, perfume, aftershave, or lotions (deodorant is allowed). Please arrive 15 minutes prior to your appointment time.  Your physician recommends that you schedule a follow-up appointment in: 3 weeks.  If you have any questions or concerns before your next appointment please send [place] a message through [place] or call our office at [phone].    TO LEAVE A MESSAGE FOR THE NURSE SELECT OPTION 2, PLEASE LEAVE A MESSAGE INCLUDING: YOUR NAME DATE OF BIRTH CALL BACK NUMBER REASON FOR CALL**this is important as we prioritize the call backs  YOU WILL RECEIVE A CALL BACK THE SAME DAY AS LONG AS YOU CALL BEFORE 4:00 PM  At the [facility], you and your health needs are our priority. As part of our continuing mission to provide you with exceptional heart care, we have created designated Provider Care Teams. These Care Teams include your primary Cardiologist (physician) and Advanced Practice Providers (APPs- Physician Assistants and Nurse Practitioners) who all work together to provide you with the care you need, when you need it.   You may see any of the following providers on your designated Care Team at your next follow up: Dr Arvilla Meres Dr [name] Dr. Dorthula Nettles Dr. Clearnce Hasten Amy Filbert Schilder, NP Robbie Lis,  [place] [facility] [place], [place] Brynda Peon, NP [place] Lee, NP Karle Plumber, PharmD   Please be sure to bring in all your medications bottles to every appointment.    Thank you for choosing [facility]

## 2023-07-17 NOTE — Telephone Encounter (Signed)
[facility] Patient Advocate Encounter  The patient was approved for a [employer] grant that will help cover the cost of Farxiga.  Total amount awarded, $10,000.  Effective: 06/17/2023 - 06/15/2024.  BIN [id number] PCN [id number] [id number] ID [id number]  Pharmacy provided with approval and processing information. Patient informed in office.  Burnell Blanks, CPhT Rx Patient Advocate Phone: [phone]

## 2023-07-18 LAB — COMPLETE METABOLIC PANEL WITH GFR
AG Ratio: 1 (calc) (ref 1.0–2.5)
ALT: 41 U/L — ABNORMAL HIGH (ref 6–29)
AST: 43 U/L — ABNORMAL HIGH (ref 10–35)
Albumin: 3.4 g/dL — ABNORMAL LOW (ref 3.6–5.1)
Alkaline phosphatase (APISO): 120 U/L (ref 37–153)
BUN/Creatinine Ratio: 19 (calc) (ref 6–22)
BUN: 37 mg/dL — ABNORMAL HIGH (ref 7–25)
CO2: 22 mmol/L (ref 20–32)
Calcium: 8.7 mg/dL (ref 8.6–10.4)
Chloride: 108 mmol/L (ref 98–110)
Creat: 1.9 mg/dL — ABNORMAL HIGH (ref 0.60–1.00)
Globulin: 3.5 g/dL (ref 1.9–3.7)
Glucose, Bld: 97 mg/dL (ref 65–99)
Potassium: 4.6 mmol/L (ref 3.5–5.3)
Sodium: 142 mmol/L (ref 135–146)
Total Bilirubin: 0.9 mg/dL (ref 0.2–1.2)
Total Protein: 6.9 g/dL (ref 6.1–8.1)
eGFR: 27 mL/min/{1.73_m2} — ABNORMAL LOW (ref >=60)

## 2023-07-19 NOTE — Progress Notes (Addendum)
PCP: Dr. Lodema Hong Cardiology: Dr. Johney Frame HF Cardiology: Dr. Shirlee Latch  77 y.o. returns for followup of CHF.   Dr. Johney Frame has followed her for a short RP SVT.  This has been quiescent recently on Toprol XL.  No palpitations.   Patient was initially found to have low EF in 2014. Repeat echo in 6/17 showed EF 35-40%. Cardiolite in 8/17 showed fixed apical defect but no evidence for ischemia. She was admitted in 1/18 with PNA, DVT, and CHF exacerbation.  Echo at that time showed EF down to 20-25%.  Troponin was mildly elevated to around 0.6. She was diuresed and discharged.   Patient was diagnosed with breast cancer in 2008 and had lumpectomy, radiation, and chemotherapy which included epirubicin, and anthracycline.  Recurrent cancer in 2019 with right mastectomy and chemo with cyclophosphamide, MTX, and fluorouracil.   She had RHC/LHC in 10/18 showing no significant CAD and mildly elevate PCWP.  Cardiac MRI in 11/18 showed EF 49%, no LGE.   Echo in 5/21 showed EF 45%, global hypokinesis, mildly decreased RV systolic function.  Echo in 7/22 showed EF 55% with normal RV and IVC.    Patient was in atrial fibrillation with RVR in 6/23, converted back to NSR with amiodarone.   Echo in 7/23 showed EF 40-45%, D-shaped septum, moderate RV dysfunction with mild RV enlargement, IVC normal.   She has been hospitalized for aspiration PNA, most recently in 9/24.   She returns for followup of CHF and atrial fibrillation.  She has been more short of breath since she returned home from 9/24 hospitalization for aspiration PNA. She was started on Lasix 20 mg daily about 1-2 weeks ago.  She was noted to be back in atrial fibrillation in 9/24 and is in atrial fibrillation today.  She has not missed any Eliquis.  She walks with a walker.  Sometimes short of breath walking across her house.  She was short of breath walking into the  office today.  No chest pain. +Orthopnea.  She is doing home PT.   ECG (personally  reviewed): atrial fibrillation 86 bpm, right axis deviation, LBBB 170 msec  Labs (7/18): K 4.3, creatinine 1.16, TSH normal, LDL 101 Labs (8/18): K 5.3, creatinine 1.45 Labs (10/18): K 4.3, creatinine 1.26 Labs (2/21): K 3.6, creatinine 1.45, BNP 239 Labs (7/21): K 4.9, creatinine 1.47, LDL 119 Labs (11/21): K 4, creatinine 1.38, LDL 89 Labs (4/22): K 4.2, creatinine 1.24 Labs (9/22): LDL 84 Labs (10/22): K 4, creatinine 1.45 Labs (10/24): K 3.2, creatinine 1.77, AST 90, ALT 76, hgb 9.8, BNP 392  PMH: 1. SVT: Short R-P SVT, followed by Dr. Johney Frame.  2. DVT in 1/18 in setting of PNA.  She was on Xarelto for about 6 months.  3. Breast cancer: Diagnosed on right in 2008.  She had lumpectomy, radiation, and chemotherapy.  She had fluorouracil, epirubicin, Cytoxan.  - Recent mammogram with right breast calcifications => biopsy with complex sclerosing lesion.  - Recurrent cancer 2019, she had right mastectomy and chemotherapy with cyclophosphamide, MTX, fluorouracil.  4. GERD 5. Hyperlipidemia 6. Chronic systolic CHF: Uncertain cause of cardiomyopathy.  Echo in 2014 showed EF 40%.  It is possible that it is related to epirubicin use (anthracycline).  - Echo (6/17): EF 35-40%, mild LV dilation.  - Cardiolite (8/17): no ischemia, apical lateral/apical fixed perfusion defect.  - Echo (1/18): EF 20-25%, moderate MR, severe TR, D-shaped IV septum, PASP 35 mmHg.  - RHC/LHC (10/18): No angiographic CAD.  Mean RA  5, PA 44/11 mean 26, mean PCWP 18, CI 3.49.  - Cardiac MRI (11/18): EF 49%, diffuse mild hypokinesis, mild RV dilation with normal function, no LGE.  - Echo (5/21): EF 45%, global hypokinesis, mildly decreased RV systolic function.  - Echo (7/22): EF 55% with normal RV and IVC. - Echo (7/23): EF 40-45%, D-shaped septum, moderate RV dysfunction with mild RV enlargement, IVC normal.  7. Atrial fibrillation: First noted in 6/23.  8. Meningioma: Resection in 6/23.  9. Aspiration PNA x 3  episodes. 10. CKD stage 3  FH: Colon cancer.  No known heart disease.   SH: Married, never smoked.  Lives in [place].  Retired Engineer, civil (consulting).    ROS: All systems reviewed and negative except as per HPI.   Current Outpatient Medications  Medication Sig Dispense Refill   acetaminophen (TYLENOL) 500 MG tablet Place 500-1,000 mg into feeding tube every 6 (six) hours as needed for mild pain.     amiodarone (PACERONE) 200 MG tablet Take 200 mg by mouth daily.     apixaban (ELIQUIS) 5 MG TABS tablet Take 5 mg by mouth 2 (two) times daily.     clonazePAM (KLONOPIN) 0.5 MG tablet TAKE (1) TABLET BY MOUTH AT BEDTIME. 30 tablet 0   FARXIGA 10 MG TABS tablet Take 1 tablet (10 mg total) by mouth daily before breakfast. 90 tablet 3   hydrocortisone (ANUSOL-HC) 2.5 % rectal cream Place 1 Application rectally 2 (two) times daily. 30 g 3   hydrocortisone (ANUSOL-HC) 25 MG suppository Place 1 suppository (25 mg total) rectally 2 (two) times daily. 12 suppository 3   lacosamide (VIMPAT) 50 MG TABS tablet Take 1 tablet (50 mg total) by mouth 2 (two) times daily. 180 tablet 2   levETIRAcetam (KEPPRA) 500 MG tablet Take 1 tablet (500 mg total) by mouth 2 (two) times daily. 180 tablet 3   ondansetron (ZOFRAN) 4 MG tablet Take 1 tablet (4 mg total) by mouth every 8 (eight) hours as needed for nausea or vomiting. 20 tablet 0   pantoprazole (PROTONIX) 40 MG tablet TAKE (1) TABLET BY MOUTH TWICE DAILY. 60 tablet 0   sertraline (ZOLOFT) 25 MG tablet TAKE 1 TABLET BY MOUTH ONCE A DAY. 30 tablet 0   furosemide (LASIX) 20 MG tablet Take 2 tablets (40 mg total) by mouth daily. 60 tablet 11   potassium chloride SA (KLOR-CON M) 20 MEQ tablet Take 2 tablets (40 mEq total) by mouth daily. 60 tablet 11   No current facility-administered medications for this encounter.   BP 102/60   Pulse 88   Wt 72.8 kg (160 lb 6.4 oz)   SpO2 94%   BMI 34.71 kg/m  General: NAD Neck: JVP 12-14 cm, no thyromegaly or thyroid nodule.  Lungs:  Clear to auscultation bilaterally with normal respiratory effort. CV: Nondisplaced PMI.  Heart regular S1/S2, no S3/S4, no murmur.  1+ ankle edema.  No carotid bruit.  Normal pedal pulses.  Abdomen: Soft, nontender, no hepatosplenomegaly, no distention.  Skin: Intact without lesions or rashes.  Neurologic: Alert and oriented x 3.  Psych: Normal affect. Extremities: No clubbing or cyanosis.  HEENT: Normal.   Assessment/Plan: 1. Chronic systolic CHF: Long-standing cardiomyopathy, since at least 2014.  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  Could be due to epirubicin exposure with 2008 breast cancer treatment.  Cardiac MRI in 11/18 showed EF up to 49% with no LGE.  Echo in 5/21 showed that EF remains in the 45% range.  Echo in  7/22 showed EF up to 55%.  Echo in 7/23 showed EF 40-45%, D-shaped septum, moderate RV dysfunction with mild RV enlargement, IVC normal.  NYHA class III symptoms, she is volume overloaded.  This may coincide with reversion to atrial fibrillation.   Will need to be careful with med adjustment given CKD stage 3.  - Increase Lasix to 40 mg daily and KCl to 40 mEq daily.  BMET/BNP in 10 days.  - Add Farxiga 10 mg daily.   - Add spironolactone at next appt. - I will arrange repeat echo.  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   - Continue amiodarone.  3. CKD stage 3: Follow closely with med adjustment.  4. Atrial fibrillation: Persistent.  She is in AF today.  This may have triggered a mild CHF exacerbation.  - I will arrange for DCCV.  She has not missed any Eliquis in the last month. We discussed risks/benefits and she agrees to procedure.  - Continue Eliquis 5 mg bid.  - Continue amiodarone 200 mg daily.  LFTs recently elevated, will need to check again today. Follow TSH and will need regular eye exam.  5. Elevated LFTs: Mild.  Noted earlier this month. ?Due to CHF, ?due to amiodarone.  - Recheck LFTs today.   Followup in 3 wks with APP.   [name] 07/19/2023

## 2023-07-19 NOTE — Addendum Note (Signed)
Encounter addended by: Laurey Morale, MD on: 07/19/2023 10:58 PM  Actions taken: Clinical Note Signed, Level of Service modified

## 2023-07-19 NOTE — H&P (View-Only) (Signed)
PCP: Dr. Lodema Hong Cardiology: Dr. Johney Frame HF Cardiology: Dr. Shirlee Latch  77 y.o. returns for followup of CHF.   Dr. Johney Frame has followed her for a short RP SVT.  This has been quiescent recently on Toprol XL.  No palpitations.   Patient was initially found to have low EF in 2014. Repeat echo in 6/17 showed EF 35-40%. Cardiolite in 8/17 showed fixed apical defect but no evidence for ischemia. She was admitted in 1/18 with PNA, DVT, and CHF exacerbation.  Echo at that time showed EF down to 20-25%.  Troponin was mildly elevated to around 0.6. She was diuresed and discharged.   Patient was diagnosed with breast cancer in 2008 and had lumpectomy, radiation, and chemotherapy which included epirubicin, and anthracycline.  Recurrent cancer in 2019 with right mastectomy and chemo with cyclophosphamide, MTX, and fluorouracil.   She had RHC/LHC in 10/18 showing no significant CAD and mildly elevate PCWP.  Cardiac MRI in 11/18 showed EF 49%, no LGE.   Echo in 5/21 showed EF 45%, global hypokinesis, mildly decreased RV systolic function.  Echo in 7/22 showed EF 55% with normal RV and IVC.    Patient was in atrial fibrillation with RVR in 6/23, converted back to NSR with amiodarone.   Echo in 7/23 showed EF 40-45%, D-shaped septum, moderate RV dysfunction with mild RV enlargement, IVC normal.   She has been hospitalized for aspiration PNA, most recently in 9/24.   She returns for followup of CHF and atrial fibrillation.  She has been more short of breath since she returned home from 9/24 hospitalization for aspiration PNA. She was started on Lasix 20 mg daily about 1-2 weeks ago.  She was noted to be back in atrial fibrillation in 9/24 and is in atrial fibrillation today.  She has not missed any Eliquis.  She walks with a walker.  Sometimes short of breath walking across her house.  She was short of breath walking into the  office today.  No chest pain. +Orthopnea.  She is doing home PT.   ECG (personally  reviewed): atrial fibrillation 86 bpm, right axis deviation, LBBB 170 msec  Labs (7/18): K 4.3, creatinine 1.16, TSH normal, LDL 101 Labs (8/18): K 5.3, creatinine 1.45 Labs (10/18): K 4.3, creatinine 1.26 Labs (2/21): K 3.6, creatinine 1.45, BNP 239 Labs (7/21): K 4.9, creatinine 1.47, LDL 119 Labs (11/21): K 4, creatinine 1.38, LDL 89 Labs (4/22): K 4.2, creatinine 1.24 Labs (9/22): LDL 84 Labs (10/22): K 4, creatinine 1.45 Labs (10/24): K 3.2, creatinine 1.77, AST 90, ALT 76, hgb 9.8, BNP 392  PMH: 1. SVT: Short R-P SVT, followed by Dr. Johney Frame.  2. DVT in 1/18 in setting of PNA.  She was on Xarelto for about 6 months.  3. Breast cancer: Diagnosed on right in 2008.  She had lumpectomy, radiation, and chemotherapy.  She had fluorouracil, epirubicin, Cytoxan.  - Recent mammogram with right breast calcifications => biopsy with complex sclerosing lesion.  - Recurrent cancer 2019, she had right mastectomy and chemotherapy with cyclophosphamide, MTX, fluorouracil.  4. GERD 5. Hyperlipidemia 6. Chronic systolic CHF: Uncertain cause of cardiomyopathy.  Echo in 2014 showed EF 40%.  It is possible that it is related to epirubicin use (anthracycline).  - Echo (6/17): EF 35-40%, mild LV dilation.  - Cardiolite (8/17): no ischemia, apical lateral/apical fixed perfusion defect.  - Echo (1/18): EF 20-25%, moderate MR, severe TR, D-shaped IV septum, PASP 35 mmHg.  - RHC/LHC (10/18): No angiographic CAD.  Mean RA  5, PA 44/11 mean 26, mean PCWP 18, CI 3.49.  - Cardiac MRI (11/18): EF 49%, diffuse mild hypokinesis, mild RV dilation with normal function, no LGE.  - Echo (5/21): EF 45%, global hypokinesis, mildly decreased RV systolic function.  - Echo (7/22): EF 55% with normal RV and IVC. - Echo (7/23): EF 40-45%, D-shaped septum, moderate RV dysfunction with mild RV enlargement, IVC normal.  7. Atrial fibrillation: First noted in 6/23.  8. Meningioma: Resection in 6/23.  9. Aspiration PNA x 3  episodes. 10. CKD stage 3  FH: Colon cancer.  No known heart disease.   SH: Married, never smoked.  Lives in [place].  Retired Engineer, civil (consulting).    ROS: All systems reviewed and negative except as per HPI.   Current Outpatient Medications  Medication Sig Dispense Refill   acetaminophen (TYLENOL) 500 MG tablet Place 500-1,000 mg into feeding tube every 6 (six) hours as needed for mild pain.     amiodarone (PACERONE) 200 MG tablet Take 200 mg by mouth daily.     apixaban (ELIQUIS) 5 MG TABS tablet Take 5 mg by mouth 2 (two) times daily.     clonazePAM (KLONOPIN) 0.5 MG tablet TAKE (1) TABLET BY MOUTH AT BEDTIME. 30 tablet 0   FARXIGA 10 MG TABS tablet Take 1 tablet (10 mg total) by mouth daily before breakfast. 90 tablet 3   hydrocortisone (ANUSOL-HC) 2.5 % rectal cream Place 1 Application rectally 2 (two) times daily. 30 g 3   hydrocortisone (ANUSOL-HC) 25 MG suppository Place 1 suppository (25 mg total) rectally 2 (two) times daily. 12 suppository 3   lacosamide (VIMPAT) 50 MG TABS tablet Take 1 tablet (50 mg total) by mouth 2 (two) times daily. 180 tablet 2   levETIRAcetam (KEPPRA) 500 MG tablet Take 1 tablet (500 mg total) by mouth 2 (two) times daily. 180 tablet 3   ondansetron (ZOFRAN) 4 MG tablet Take 1 tablet (4 mg total) by mouth every 8 (eight) hours as needed for nausea or vomiting. 20 tablet 0   pantoprazole (PROTONIX) 40 MG tablet TAKE (1) TABLET BY MOUTH TWICE DAILY. 60 tablet 0   sertraline (ZOLOFT) 25 MG tablet TAKE 1 TABLET BY MOUTH ONCE A DAY. 30 tablet 0   furosemide (LASIX) 20 MG tablet Take 2 tablets (40 mg total) by mouth daily. 60 tablet 11   potassium chloride SA (KLOR-CON M) 20 MEQ tablet Take 2 tablets (40 mEq total) by mouth daily. 60 tablet 11   No current facility-administered medications for this encounter.   BP 102/60   Pulse 88   Wt 72.8 kg (160 lb 6.4 oz)   SpO2 94%   BMI 34.71 kg/m  General: NAD Neck: JVP 12-14 cm, no thyromegaly or thyroid nodule.  Lungs:  Clear to auscultation bilaterally with normal respiratory effort. CV: Nondisplaced PMI.  Heart regular S1/S2, no S3/S4, no murmur.  1+ ankle edema.  No carotid bruit.  Normal pedal pulses.  Abdomen: Soft, nontender, no hepatosplenomegaly, no distention.  Skin: Intact without lesions or rashes.  Neurologic: Alert and oriented x 3.  Psych: Normal affect. Extremities: No clubbing or cyanosis.  HEENT: Normal.   Assessment/Plan: 1. Chronic systolic CHF: Long-standing cardiomyopathy, since at least 2014.  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  Could be due to epirubicin exposure with 2008 breast cancer treatment.  Cardiac MRI in 11/18 showed EF up to 49% with no LGE.  Echo in 5/21 showed that EF remains in the 45% range.  Echo in  7/22 showed EF up to 55%.  Echo in 7/23 showed EF 40-45%, D-shaped septum, moderate RV dysfunction with mild RV enlargement, IVC normal.  NYHA class III symptoms, she is volume overloaded.  This may coincide with reversion to atrial fibrillation.   Will need to be careful with med adjustment given CKD stage 3.  - Increase Lasix to 40 mg daily and KCl to 40 mEq daily.  BMET/BNP in 10 days.  - Add Farxiga 10 mg daily.   - Add spironolactone at next appt. - I will arrange repeat echo.  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   - Continue amiodarone.  3. CKD stage 3: Follow closely with med adjustment.  4. Atrial fibrillation: Persistent.  She is in AF today.  This may have triggered a mild CHF exacerbation.  - I will arrange for DCCV.  She has not missed any Eliquis in the last month. We discussed risks/benefits and she agrees to procedure.  - Continue Eliquis 5 mg bid.  - Continue amiodarone 200 mg daily.  LFTs recently elevated, will need to check again today. Follow TSH and will need regular eye exam.  5. Elevated LFTs: Mild.  Noted earlier this month. ?Due to CHF, ?due to amiodarone.  - Recheck LFTs today.   Followup in 3 wks with APP.   [name] 07/19/2023

## 2023-07-20 ENCOUNTER — Encounter (HOSPITAL_COMMUNITY): Payer: Self-pay

## 2023-07-20 ENCOUNTER — Telehealth (HOSPITAL_COMMUNITY): Payer: Self-pay

## 2023-07-20 NOTE — Assessment & Plan Note (Signed)
Needs to re establish with Cardiology who need to be responsible for ongoing management

## 2023-07-20 NOTE — Progress Notes (Signed)
Tara [name]     MRN: [id number]      DOB: [birth year]  Chief Complaint  Patient presents with   Hospitalization Follow-up    TOC feeling better still having swelling out of lasix     HPI Tara [name] is here for follow up of recent hospitalization from 9/27 to 06/22/2023 hospitalized with AKI , also LLL pneumonia , community acquired, possibly due to aspiration, returned to ED on 10/02 with acute on chronic heart fialure ROS Denies recent fever or chills. Denies sinus pressure, nasal congestion, ear pain or sore throat. C/o chest congestion and cough no sputum. Denies chest pains, c/o palpitations and  leg swelling Denies abdominal pain, nausea, vomiting,diarrhea or constipation.   Denies dysuria, frequency, . Chronic  limitation in mobility. Denies headaches, seizures, numbness, or tingling. Denies depression, anxiety or insomnia. Denies skin break down or rash.   PE  BP 110/75 (BP Location: Left Arm, Patient Position: Sitting, Cuff Size: Large)   Pulse 95   Ht 4\' 9"  (1.448 m)   Wt 162 lb (73.5 kg)   SpO2 92%   BMI 35.06 kg/m   Patient alert chronically ill appearing  and in no cardiopulmonary distress.  HEENT: No facial asymmetry, EOMI,     Neck decreased rPMle .  Chest: decreased air entry bronchial breath sound sin LLL.  CVS: S1, S2 no murmurs, no S3.Regular rate.  ABD: Soft non tender.   Ext: trace  edema  MS: decreased  ROM spine, shoulders, hips and knees.  Skin: Intact, no ulcerations or rash noted.  Psych: Good eye contact, normal affect. Memory I'm[paired not anxious or depressed appearing.  CNS: CN 2-12 intact, power,  normal throughout.no focal deficits noted.   Assessment & Plan  Aspiration pneumonia (HCC) Rept CXR 07/22/2023 needed, currently mildly symptomatic  Chronic HFrEF (heart failure with reduced ejection fraction) (HCC) Needs to re establish with Cardiology who need to be responsible for ongoing management   AKI (acute kidney injury)  [facility]) Refer to nephrology asap  Hospital discharge follow-up Patient in for follow up of recent hospitalization. Discharge summary, and laboratory and radiology data are reviewed, and any questions or concerns  are discussed. Specific issues requiring follow up are specifically addressed.   GERD Controlled, no change in medication   Seizure disorder [facility]) Managed by Neurology dose reduced of kepra due to renal dz

## 2023-07-20 NOTE — Assessment & Plan Note (Signed)
Managed by Neurology dose reduced of kepra due to renal dz

## 2023-07-20 NOTE — Assessment & Plan Note (Signed)
Refer to nephrology asap

## 2023-07-20 NOTE — Telephone Encounter (Signed)
Attempted to reach patient to discuss cardioversion that has been scheduled.November 14@7 :30 am arrival 6:30. Left message to call office back to review instructions.

## 2023-07-20 NOTE — Assessment & Plan Note (Signed)
Patient in for follow up of recent hospitalization. Discharge summary, and laboratory and radiology data are reviewed, and any questions or concerns  are discussed. Specific issues requiring follow up are specifically addressed.  

## 2023-07-20 NOTE — Assessment & Plan Note (Signed)
Rept CXR 07/22/2023 needed, currently mildly symptomatic

## 2023-07-20 NOTE — Assessment & Plan Note (Signed)
Controlled, no change in medication  

## 2023-07-21 NOTE — Telephone Encounter (Signed)
Spoke with patients daughter (okay per DPR) and went over cardioversion instructions, she is aware and verbalized understanding.   She also wanted to verify amount of fluid- provided this information to patients daughter- no more than 2 liters of fluid per day   Also spoke with patients husband- advised him of procedure instructions and information and education on fluid intake. He verbalized understanding of all instructions.

## 2023-07-22 ENCOUNTER — Encounter: Payer: Self-pay | Admitting: Family Medicine

## 2023-07-22 ENCOUNTER — Ambulatory Visit (INDEPENDENT_AMBULATORY_CARE_PROVIDER_SITE_OTHER): Payer: Medicare Other | Admitting: Family Medicine

## 2023-07-22 VITALS — BP 91/62 | HR 97 | Ht <= 58 in | Wt 151.0 lb

## 2023-07-22 DIAGNOSIS — Z0001 Encounter for general adult medical examination with abnormal findings: Secondary | ICD-10-CM

## 2023-07-22 DIAGNOSIS — R748 Abnormal levels of other serum enzymes: Secondary | ICD-10-CM

## 2023-07-22 DIAGNOSIS — Z23 Encounter for immunization: Secondary | ICD-10-CM | POA: Diagnosis not present

## 2023-07-22 DIAGNOSIS — K5900 Constipation, unspecified: Secondary | ICD-10-CM

## 2023-07-22 MED ORDER — POLYETHYLENE GLYCOL 3350 17 G PO PACK: PACK | ORAL | 5 refills | Status: AC

## 2023-07-22 MED ORDER — AZELASTINE HCL 0.1 % NA SOLN: 2.0000 | Freq: Two times a day (BID) | NASAL | 12 refills | Status: DC

## 2023-07-22 NOTE — Patient Instructions (Addendum)
F/U in 4 months  You are to hold farxiga for 3 days before cardioversion, as stated by Dr Shirlee Latch  New for bowels colace 2 every day Daily All Bran or Raisin Bran and warm prune juice or 2 dry prunes  Dulcolax 5 mg  laxative tablet  one every 3 days IF no BM, give the morning of day 4 I will also message Cardiology about this since new  New for allergies is astellin  I am going to refer you to your GI Doc since liver enzymes are increased and constipation   Flu vaccine today  Thanks for choosing [facility], we consider it a privelige to serve you.

## 2023-07-23 DIAGNOSIS — N179 Acute kidney failure, unspecified: Secondary | ICD-10-CM | POA: Diagnosis not present

## 2023-07-23 DIAGNOSIS — I110 Hypertensive heart disease with heart failure: Secondary | ICD-10-CM | POA: Diagnosis not present

## 2023-07-23 DIAGNOSIS — I5022 Chronic systolic (congestive) heart failure: Secondary | ICD-10-CM | POA: Diagnosis not present

## 2023-07-23 DIAGNOSIS — F32A Depression, unspecified: Secondary | ICD-10-CM | POA: Diagnosis not present

## 2023-07-23 DIAGNOSIS — F419 Anxiety disorder, unspecified: Secondary | ICD-10-CM | POA: Diagnosis not present

## 2023-07-23 DIAGNOSIS — E785 Hyperlipidemia, unspecified: Secondary | ICD-10-CM | POA: Diagnosis not present

## 2023-07-23 DIAGNOSIS — Z7952 Long term (current) use of systemic steroids: Secondary | ICD-10-CM | POA: Diagnosis not present

## 2023-07-23 DIAGNOSIS — I480 Paroxysmal atrial fibrillation: Secondary | ICD-10-CM | POA: Diagnosis not present

## 2023-07-23 DIAGNOSIS — I471 Supraventricular tachycardia, unspecified: Secondary | ICD-10-CM | POA: Diagnosis not present

## 2023-07-23 DIAGNOSIS — G40909 Epilepsy, unspecified, not intractable, without status epilepticus: Secondary | ICD-10-CM | POA: Diagnosis not present

## 2023-07-23 DIAGNOSIS — R1310 Dysphagia, unspecified: Secondary | ICD-10-CM | POA: Diagnosis not present

## 2023-07-23 DIAGNOSIS — Z7901 Long term (current) use of anticoagulants: Secondary | ICD-10-CM | POA: Diagnosis not present

## 2023-07-23 DIAGNOSIS — K648 Other hemorrhoids: Secondary | ICD-10-CM | POA: Diagnosis not present

## 2023-07-23 DIAGNOSIS — I428 Other cardiomyopathies: Secondary | ICD-10-CM | POA: Diagnosis not present

## 2023-07-23 DIAGNOSIS — K5790 Diverticulosis of intestine, part unspecified, without perforation or abscess without bleeding: Secondary | ICD-10-CM | POA: Diagnosis not present

## 2023-07-23 DIAGNOSIS — K219 Gastro-esophageal reflux disease without esophagitis: Secondary | ICD-10-CM | POA: Diagnosis not present

## 2023-07-24 DIAGNOSIS — K5790 Diverticulosis of intestine, part unspecified, without perforation or abscess without bleeding: Secondary | ICD-10-CM | POA: Diagnosis not present

## 2023-07-24 DIAGNOSIS — G40909 Epilepsy, unspecified, not intractable, without status epilepticus: Secondary | ICD-10-CM | POA: Diagnosis not present

## 2023-07-24 DIAGNOSIS — E785 Hyperlipidemia, unspecified: Secondary | ICD-10-CM | POA: Diagnosis not present

## 2023-07-24 DIAGNOSIS — I110 Hypertensive heart disease with heart failure: Secondary | ICD-10-CM | POA: Diagnosis not present

## 2023-07-24 DIAGNOSIS — Z7901 Long term (current) use of anticoagulants: Secondary | ICD-10-CM | POA: Diagnosis not present

## 2023-07-24 DIAGNOSIS — K648 Other hemorrhoids: Secondary | ICD-10-CM | POA: Diagnosis not present

## 2023-07-24 DIAGNOSIS — Z7952 Long term (current) use of systemic steroids: Secondary | ICD-10-CM | POA: Diagnosis not present

## 2023-07-24 DIAGNOSIS — F419 Anxiety disorder, unspecified: Secondary | ICD-10-CM | POA: Diagnosis not present

## 2023-07-24 DIAGNOSIS — I428 Other cardiomyopathies: Secondary | ICD-10-CM | POA: Diagnosis not present

## 2023-07-24 DIAGNOSIS — I480 Paroxysmal atrial fibrillation: Secondary | ICD-10-CM | POA: Diagnosis not present

## 2023-07-24 DIAGNOSIS — K219 Gastro-esophageal reflux disease without esophagitis: Secondary | ICD-10-CM | POA: Diagnosis not present

## 2023-07-24 DIAGNOSIS — N179 Acute kidney failure, unspecified: Secondary | ICD-10-CM | POA: Diagnosis not present

## 2023-07-24 DIAGNOSIS — I471 Supraventricular tachycardia, unspecified: Secondary | ICD-10-CM | POA: Diagnosis not present

## 2023-07-24 DIAGNOSIS — R1310 Dysphagia, unspecified: Secondary | ICD-10-CM | POA: Diagnosis not present

## 2023-07-24 DIAGNOSIS — I5022 Chronic systolic (congestive) heart failure: Secondary | ICD-10-CM | POA: Diagnosis not present

## 2023-07-24 DIAGNOSIS — F32A Depression, unspecified: Secondary | ICD-10-CM | POA: Diagnosis not present

## 2023-07-27 ENCOUNTER — Telehealth: Payer: Self-pay | Admitting: Family Medicine

## 2023-07-27 NOTE — Telephone Encounter (Signed)
Verbal order given  

## 2023-07-27 NOTE — Telephone Encounter (Signed)
Andres Shad Occupation Therapist with [employer]   Verbal orders Add one OT visit   Pt missed visit on 10/25 Caregiver declined pt had too many appts scheduled on 10/25  Call back [phone]

## 2023-07-28 ENCOUNTER — Ambulatory Visit: Payer: Medicare Other | Admitting: Neurology

## 2023-07-29 DIAGNOSIS — Z7952 Long term (current) use of systemic steroids: Secondary | ICD-10-CM | POA: Diagnosis not present

## 2023-07-29 DIAGNOSIS — Z7901 Long term (current) use of anticoagulants: Secondary | ICD-10-CM | POA: Diagnosis not present

## 2023-07-29 DIAGNOSIS — N179 Acute kidney failure, unspecified: Secondary | ICD-10-CM | POA: Diagnosis not present

## 2023-07-29 DIAGNOSIS — I5022 Chronic systolic (congestive) heart failure: Secondary | ICD-10-CM | POA: Diagnosis not present

## 2023-07-29 DIAGNOSIS — F32A Depression, unspecified: Secondary | ICD-10-CM | POA: Diagnosis not present

## 2023-07-29 DIAGNOSIS — R1310 Dysphagia, unspecified: Secondary | ICD-10-CM | POA: Diagnosis not present

## 2023-07-29 DIAGNOSIS — I471 Supraventricular tachycardia, unspecified: Secondary | ICD-10-CM | POA: Diagnosis not present

## 2023-07-29 DIAGNOSIS — I110 Hypertensive heart disease with heart failure: Secondary | ICD-10-CM | POA: Diagnosis not present

## 2023-07-29 DIAGNOSIS — K5790 Diverticulosis of intestine, part unspecified, without perforation or abscess without bleeding: Secondary | ICD-10-CM | POA: Diagnosis not present

## 2023-07-29 DIAGNOSIS — K648 Other hemorrhoids: Secondary | ICD-10-CM | POA: Diagnosis not present

## 2023-07-29 DIAGNOSIS — F419 Anxiety disorder, unspecified: Secondary | ICD-10-CM | POA: Diagnosis not present

## 2023-07-29 DIAGNOSIS — Z9181 History of falling: Secondary | ICD-10-CM | POA: Diagnosis not present

## 2023-07-29 DIAGNOSIS — I428 Other cardiomyopathies: Secondary | ICD-10-CM | POA: Diagnosis not present

## 2023-07-29 DIAGNOSIS — I480 Paroxysmal atrial fibrillation: Secondary | ICD-10-CM | POA: Diagnosis not present

## 2023-07-29 DIAGNOSIS — K219 Gastro-esophageal reflux disease without esophagitis: Secondary | ICD-10-CM | POA: Diagnosis not present

## 2023-07-29 DIAGNOSIS — Z853 Personal history of malignant neoplasm of breast: Secondary | ICD-10-CM | POA: Diagnosis not present

## 2023-07-29 DIAGNOSIS — G40909 Epilepsy, unspecified, not intractable, without status epilepticus: Secondary | ICD-10-CM | POA: Diagnosis not present

## 2023-07-29 DIAGNOSIS — E785 Hyperlipidemia, unspecified: Secondary | ICD-10-CM | POA: Diagnosis not present

## 2023-07-30 ENCOUNTER — Other Ambulatory Visit: Payer: Self-pay | Admitting: Family Medicine

## 2023-07-30 ENCOUNTER — Ambulatory Visit (HOSPITAL_COMMUNITY)
Admission: RE | Admit: 2023-07-30 | Discharge: 2023-07-30 | Disposition: A | Discharge status: Home / Self Care | Payer: Medicare Other | Source: Ambulatory Visit | Attending: Cardiology | Admitting: Cardiology

## 2023-07-30 DIAGNOSIS — I081 Rheumatic disorders of both mitral and tricuspid valves: Secondary | ICD-10-CM | POA: Insufficient documentation

## 2023-07-30 DIAGNOSIS — I509 Heart failure, unspecified: Secondary | ICD-10-CM | POA: Diagnosis not present

## 2023-07-30 DIAGNOSIS — I471 Supraventricular tachycardia, unspecified: Secondary | ICD-10-CM | POA: Diagnosis not present

## 2023-07-30 DIAGNOSIS — I5022 Chronic systolic (congestive) heart failure: Secondary | ICD-10-CM

## 2023-07-30 DIAGNOSIS — I110 Hypertensive heart disease with heart failure: Secondary | ICD-10-CM | POA: Insufficient documentation

## 2023-07-30 DIAGNOSIS — E785 Hyperlipidemia, unspecified: Secondary | ICD-10-CM | POA: Insufficient documentation

## 2023-07-30 LAB — ECHOCARDIOGRAM COMPLETE
Calc EF: 27.8 %
S' Lateral: 4.5 cm
Single Plane A2C EF: 32 %
Single Plane A4C EF: 24.2 %

## 2023-08-02 ENCOUNTER — Encounter: Payer: Self-pay | Admitting: Family Medicine

## 2023-08-02 DIAGNOSIS — Z23 Encounter for immunization: Secondary | ICD-10-CM | POA: Insufficient documentation

## 2023-08-02 DIAGNOSIS — R748 Abnormal levels of other serum enzymes: Secondary | ICD-10-CM | POA: Insufficient documentation

## 2023-08-02 DIAGNOSIS — Z0001 Encounter for general adult medical examination with abnormal findings: Secondary | ICD-10-CM | POA: Insufficient documentation

## 2023-08-02 NOTE — Assessment & Plan Note (Signed)
Refer GI for eval and management

## 2023-08-02 NOTE — Assessment & Plan Note (Signed)
Recurrent concern and conflicting reports. Commit to daily stool softener, brn, pruness/prune juice , laxative tab every e 3 days , if needed, refer GI

## 2023-08-02 NOTE — Assessment & Plan Note (Signed)
After obtaining informed consent, the influenza vaccine is  administered , with no adverse effect noted at the time of administration.

## 2023-08-02 NOTE — Assessment & Plan Note (Signed)
Annual exam as documented. . Immunization and cancer screening needs are specifically addressed at this visit.  

## 2023-08-02 NOTE — Progress Notes (Signed)
    Tara [name]     MRN: [id number]      DOB: 11-14-1945  Chief Complaint  Patient presents with   Annual Exam    Cpe, irregular bowel movements need nausea medication and cough medication    HPI: Patient is in for annual physical exam. Concerns as above. Son accompanies his parents and expresses his concerns about her health. States she often complains that she is tired and is concerned about her bowel movements, is under the impression that she often needs  Immunization is reviewed , and  updated    PE: BP 91/62 (BP Location: Left Arm, Patient Position: Sitting, Cuff Size: Large)   Pulse 97   Ht 4\' 9"  (1.448 m)   Wt 151 lb 0.6 oz (68.5 kg)   SpO2 94%   BMI 32.68 kg/m   Pleasant  female, alert and oriented , chronically ill appearing   HEENT No facial trauma or asymetry. Sinuses non tender.  Extra occullar muscles intact.. External ears normal, . Neck: decreased ROM, no adenopathy,JVD or thyromegaly.No bruits.  Chest: Clear to ascultation bilaterally.No crackles or wheezes. Non tender to palpation  Cardiovascular system; Heart sounds  S1 and  S2 ,no S3.  Systolic  murmur.Irregularly , irregular pulse   Abdomen: Soft, non tender, no organomegaly or masses. .   .   .   Neurologic: Cranial nerves 2 to 12 intact. Power, tone ,sensation  normal throughout.  disturbance in gait.  Uses a walker  No tremor.  Skin: Intact, no ulceration reported Pigmentation normal throughout  Psych; Not depressed or anxious Assessment & Plan:  Encounter for Medicare annual examination with abnormal findings Annual exam as documented.  Immunization and cancer screening needs are specifically addressed at this visit.   Elevated liver enzymes Refer GI for eval and management  Constipation Recurrent concern and conflicting reports. Commit to daily stool softener, brn, pruness/prune juice , laxative tab every e 3 days , if needed, refer GI  Encounter for  immunization After obtaining informed consent, the influenza  vaccine is  administered , with no adverse effect noted at the time of administration.

## 2023-08-04 DIAGNOSIS — N184 Chronic kidney disease, stage 4 (severe): Secondary | ICD-10-CM | POA: Diagnosis not present

## 2023-08-04 DIAGNOSIS — I10 Essential (primary) hypertension: Secondary | ICD-10-CM | POA: Diagnosis not present

## 2023-08-04 DIAGNOSIS — D631 Anemia in chronic kidney disease: Secondary | ICD-10-CM | POA: Diagnosis not present

## 2023-08-05 ENCOUNTER — Encounter: Payer: Medicare Other | Attending: Family Medicine | Admitting: Nutrition

## 2023-08-05 ENCOUNTER — Other Ambulatory Visit: Payer: Self-pay

## 2023-08-05 ENCOUNTER — Encounter: Payer: Self-pay | Admitting: Nutrition

## 2023-08-05 VITALS — Ht <= 58 in | Wt 147.0 lb

## 2023-08-05 DIAGNOSIS — E119 Type 2 diabetes mellitus without complications: Secondary | ICD-10-CM | POA: Diagnosis not present

## 2023-08-05 DIAGNOSIS — N184 Chronic kidney disease, stage 4 (severe): Secondary | ICD-10-CM | POA: Insufficient documentation

## 2023-08-05 DIAGNOSIS — R635 Abnormal weight gain: Secondary | ICD-10-CM | POA: Diagnosis not present

## 2023-08-05 DIAGNOSIS — K2100 Gastro-esophageal reflux disease with esophagitis, without bleeding: Secondary | ICD-10-CM

## 2023-08-05 DIAGNOSIS — K219 Gastro-esophageal reflux disease without esophagitis: Secondary | ICD-10-CM | POA: Insufficient documentation

## 2023-08-05 NOTE — Progress Notes (Signed)
Spoke to pt and instructed them to come at 0630 and to be NPO after 0000. Confirmed no missed doses of AC and instructed to take in AM with a small sip of water.   Confirmed that pt will have a ride home and someone to stay with them for 24 hours after the procedure.

## 2023-08-05 NOTE — Patient Instructions (Signed)
Goals Established by Pt Cut out processed meats, fat back, fried foods Increase fruits, vegetables and dried beans Drink only water Avoid salty foods. Eat 3 balanced meals per day Increase vegetables and fruit daily.

## 2023-08-05 NOTE — Anesthesia Preprocedure Evaluation (Addendum)
Anesthesia Evaluation  Patient identified by MRN, date of birth, ID band Patient awake    Reviewed: Allergy & Precautions, NPO status , Patient's Chart, lab work & pertinent test results  History of Anesthesia Complications Negative for: history of anesthetic complications  Airway Mallampati: II  TM Distance: >3 FB Neck ROM: Full    Dental  (+) Dental Advisory Given   Pulmonary neg pulmonary ROS   breath sounds clear to auscultation       Cardiovascular hypertension, Pt. on medications (-) angina +CHF  + dysrhythmias Atrial Fibrillation  Rhythm:Regular Rate:Normal + Systolic murmurs 78/10/9560 ECHO: EF 30-35%, moderately decreased LVF, global hypokinesis, reduced RVF with R ventricular enlargement, moderated MR, mod-severe TR,    Neuro/Psych Seizures -,   Anxiety Depression       GI/Hepatic Neg liver ROS, hiatal hernia,GERD  Medicated and Controlled,,  Endo/Other  negative endocrine ROS    Renal/GU Renal InsufficiencyRenal disease     Musculoskeletal   Abdominal   Peds  Hematology eliquis   Anesthesia Other Findings H/o breast cancer  Reproductive/Obstetrics                              Anesthesia Physical Anesthesia Plan  ASA: 3  Anesthesia Plan: General   Post-op Pain Management: Minimal or no pain anticipated   Induction: Intravenous  PONV Risk Score and Plan: 3 and Treatment may vary due to age or medical condition  Airway Management Planned: Natural Airway and Simple Face Mask  Additional Equipment: None  Intra-op Plan:   Post-operative Plan:   Informed Consent: I have reviewed the patients History and Physical, chart, labs and discussed the procedure including the risks, benefits and alternatives for the proposed anesthesia with the patient or authorized representative who has indicated his/her understanding and acceptance.     Dental advisory given  Plan Discussed  with: CRNA and Surgeon  Anesthesia Plan Comments:         Anesthesia Quick Evaluation

## 2023-08-05 NOTE — Progress Notes (Signed)
PCP: Dr. Lodema Hong Cardiology: Dr. Johney Frame HF Cardiology: Dr. Shirlee Latch  77 y.o. returns for followup of CHF.   Dr. Johney Frame has followed her for a short RP SVT.  This has been quiescent recently on Toprol XL.  No palpitations.   Patient was initially found to have low EF in 2014. Repeat echo in 6/17 showed EF 35-40%. Cardiolite in 8/17 showed fixed apical defect but no evidence for ischemia. She was admitted in 1/18 with PNA, DVT, and CHF exacerbation.  Echo at that time showed EF down to 20-25%.  Troponin was mildly elevated to around 0.6. She was diuresed and discharged.   Patient was diagnosed with breast cancer in 2008 and had lumpectomy, radiation, and chemotherapy which included epirubicin, and anthracycline.  Recurrent cancer in 2019 with right mastectomy and chemo with cyclophosphamide, MTX, and fluorouracil.   She had RHC/LHC in 10/18 showing no significant CAD and mildly elevate PCWP.  Cardiac MRI in 11/18 showed EF 49%, no LGE.   Echo in 5/21 showed EF 45%, global hypokinesis, mildly decreased RV systolic function.  Echo in 7/22 showed EF 55% with normal RV and IVC.    Patient was in atrial fibrillation with RVR in 6/23, converted back to NSR with amiodarone.   Echo in 7/23 showed EF 40-45%, D-shaped septum, moderate RV dysfunction with mild RV enlargement, IVC normal.   She has been hospitalized for aspiration PNA, most recently in 9/24.   Follow up 10/24, in atrial fibrillation and mildly volume overloaded. Lasix increased to 40 daily, Farxiga started and arranged for repeat echo and DCCV.   Echo 11/24 showed EF 30-35%, RV mildly reduced.  DCCV 08/06/23 to NSR   Today she returns for HF follow up with her daughter and husband. Overall feeling fair. Husband gives most of history, he says Miamor is unable to answer questions. Main complaint is LLE pain that started this morning. She is not physically very active, walks with a walker around her house for short distances. She is  fatigued. Denies palpitations, CP, dizziness, or PND/Orthopnea. Appetite fair. No fever or chills. Weight at home 154 pounds. Taking all medications, has been off Lasix x 2 days. Daughter worried leg pain could be DVT., she has had these in the past.  ReDs: 31%  ECG (personally reviewed): NSR with 1AVB, 71 bpm  Labs (7/18): K 4.3, creatinine 1.16, TSH normal, LDL 101 Labs (8/18): K 5.3, creatinine 1.45 Labs (10/18): K 4.3, creatinine 1.26 Labs (2/21): K 3.6, creatinine 1.45, BNP 239 Labs (7/21): K 4.9, creatinine 1.47, LDL 119 Labs (11/21): K 4, creatinine 1.38, LDL 89 Labs (4/22): K 4.2, creatinine 1.24 Labs (9/22): LDL 84 Labs (10/22): K 4, creatinine 1.45 Labs (10/24): K 4.3, creatinine 1.95, AST 54, ALT 46, hgb 9.8, BNP 392  PMH: 1. SVT: Short R-P SVT, followed by Dr. Johney Frame.  2. DVT in 1/18 in setting of PNA.  She was on Xarelto for about 6 months.  3. Breast cancer: Diagnosed on right in 2008.  She had lumpectomy, radiation, and chemotherapy.  She had fluorouracil, epirubicin, Cytoxan.  - Recent mammogram with right breast calcifications => biopsy with complex sclerosing lesion.  - Recurrent cancer 2019, she had right mastectomy and chemotherapy with cyclophosphamide, MTX, fluorouracil.  4. GERD 5. Hyperlipidemia 6. Chronic systolic CHF: Uncertain cause of cardiomyopathy.  Echo in 2014 showed EF 40%.  It is possible that it is related to epirubicin use (anthracycline).  - Echo (6/17): EF 35-40%, mild LV dilation.  - Cardiolite (8/17):  no ischemia, apical lateral/apical fixed perfusion defect.  - Echo (1/18): EF 20-25%, moderate MR, severe TR, D-shaped IV septum, PASP 35 mmHg.  - RHC/LHC (10/18): No angiographic CAD.  Mean RA 5, PA 44/11 mean 26, mean PCWP 18, CI 3.49.  - Cardiac MRI (11/18): EF 49%, diffuse mild hypokinesis, mild RV dilation with normal function, no LGE.  - Echo (5/21): EF 45%, global hypokinesis, mildly decreased RV systolic function.  - Echo (7/22): EF 55%  with normal RV and IVC. - Echo (7/23): EF 40-45%, D-shaped septum, moderate RV dysfunction with mild RV enlargement, IVC normal.  - Echo (11/24): EF 30-35%, RV systolic function reduced, moderate MR and moderate to severe TR 7. Atrial fibrillation: First noted in 6/23. S/p DCCV 11/24. 8. Meningioma: Resection in 6/23.  9. Aspiration PNA x 3 episodes. 10. CKD stage 3  FH: Colon cancer.  No known heart disease.   SH: Married, never smoked.  Lives in [place].  Retired Engineer, civil (consulting).    ROS: All systems reviewed and negative except as per HPI.   Current Outpatient Medications  Medication Sig Dispense Refill   acetaminophen (TYLENOL) 500 MG tablet Place 500-1,000 mg into feeding tube every 6 (six) hours as needed for mild pain.     amiodarone (PACERONE) 200 MG tablet TAKE 1 TABLET VIA G-TUBE ONCE DAILY. 30 tablet 0   apixaban (ELIQUIS) 5 MG TABS tablet Take 5 mg by mouth 2 (two) times daily.     azelastine (ASTELIN) 0.1 % nasal spray Place 2 sprays into both nostrils 2 (two) times daily. Use in each nostril as directed 30 mL 12   bisacodyl 5 MG EC tablet One tablet every 3 days, if needed, for constipation     clonazePAM (KLONOPIN) 0.5 MG tablet TAKE (1) TABLET BY MOUTH AT BEDTIME. 30 tablet 0   docusate sodium (COLACE) 100 MG capsule Take 1 capsule (100 mg total) by mouth 2 (two) times daily.     FARXIGA 10 MG TABS tablet Take 1 tablet (10 mg total) by mouth daily before breakfast. 90 tablet 3   furosemide (LASIX) 20 MG tablet Take 2 tablets (40 mg total) by mouth daily. 60 tablet 11   hydrocortisone (ANUSOL-HC) 2.5 % rectal cream Place 1 Application rectally 2 (two) times daily. 30 g 3   hydrocortisone (ANUSOL-HC) 25 MG suppository Place 1 suppository (25 mg total) rectally 2 (two) times daily. 12 suppository 3   lacosamide (VIMPAT) 50 MG TABS tablet Take 1 tablet (50 mg total) by mouth 2 (two) times daily. 180 tablet 2   levETIRAcetam (KEPPRA) 500 MG tablet Take 1 tablet (500 mg total) by mouth  2 (two) times daily. 180 tablet 3   pantoprazole (PROTONIX) 40 MG tablet TAKE (1) TABLET BY MOUTH TWICE DAILY. 60 tablet 0   polyethylene glycol (MIRALAX) 17 g packet Take one packet mira lax once daily in 8 ounces water 28 each 5   potassium chloride SA (KLOR-CON M) 20 MEQ tablet Take 2 tablets (40 mEq total) by mouth daily. 60 tablet 11   sertraline (ZOLOFT) 25 MG tablet TAKE 1 TABLET BY MOUTH ONCE A DAY. 30 tablet 0   No current facility-administered medications for this encounter.   Wt Readings from Last 3 Encounters:  08/07/23 68.3 kg (150 lb 9.6 oz)  08/06/23 67.1 kg (148 lb)  08/05/23 66.7 kg (147 lb)   BP 106/68   Pulse 68   Wt 68.3 kg (150 lb 9.6 oz)   SpO2 91%   BMI  32.59 kg/m  Physical Exam General:  NAD. No resp difficulty, arrived in [facility], frail-appearing HEENT: Normal Neck: Supple. No JVD. Carotids 2+ bilat; no bruits. No lymphadenopathy or thryomegaly appreciated. Cor: PMI nondisplaced. Regular rate & rhythm. No rubs, gallops or murmurs. Lungs: Clear Abdomen: Soft, nontender, nondistended. No hepatosplenomegaly. No bruits or masses. Good bowel sounds. Extremities: No cyanosis, clubbing, rash, trace pedal edema L>R Neuro: Alert & oriented x 3, cranial nerves grossly intact. Moves all 4 extremities w/o difficulty. Affect pleasant.   Assessment/Plan: 1. Chronic systolic CHF: Long-standing cardiomyopathy, since at least 2014.  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  Could be due to epirubicin exposure with 2008 breast cancer treatment.  Cardiac MRI in 11/18 showed EF up to 49% with no LGE.  Echo in 5/21 showed that EF remains in the 45% range.  Echo in 7/22 showed EF up to 55%.  Echo in 7/23 showed EF 40-45%, D-shaped septum, moderate RV dysfunction with mild RV enlargement, IVC normal.  Back in atrial fibrillation 10/24, echo 11/24 showed EF 30-35%. ? If reduction in EF due to atrial fibrillation. NYHA class III symptoms, fatigue appears to be main limiter.  She is not volume overloaded today, REDs 31%. Weight down 12 lbs. GDMT limited by CKD stage 3 & marginal BP.  - Decrease Lasix to 20 mg daily + decrease KCL to 20 mEq daily.  - Continue Farxiga 10 mg daily.  No GU symptoms. - BP too soft to add spiro or losartan. Plan to add next if able. - Plan to repeat echo in 3 months with restoration of SR. - Place compression hose. - Refer to [facility] at [facility]. 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   - Continue amiodarone.  3. CKD stage 3: Baseline SCr 1.9 - Continue SGLT2i. BMET today. 4. Atrial fibrillation: Persistent.  S/p DC-CV 11/24. NSR on ECG today. - Continue Eliquis 5 mg bid. No bleeding issues. - Continue amiodarone 200 mg daily.  LFTs recently elevated, will need to check again today. Follow TSH and will need regular eye exam.  5. Elevated LFTs: Mild.  Noted earlier this month. ? Due to CHF, ? due to amiodarone.  - Recheck LFTs today.  6. LLE pain: likely MSK vs edema. Patient has history of DVT, considered provoked in setting of PNA. Low suspicion for this today as she is on Eliquis but will check LE venous duplex to rule out.  Follow up in 3 wks with PharmD or APP (add spiro or losartan if renal function and BP allow) and 3 months with Dr. Shirlee Latch + echo  Anderson [facility] FNP-BC 08/07/2023  Greater than 50% of the (total minutes 40) visit spent in counseling/coordination of care regarding (GDMT, reduced EF, plans for echo and work up for LE pain)

## 2023-08-05 NOTE — Progress Notes (Signed)
Medical Nutrition Therapy  Appointment Start time:  1330  Appointment End time:  1500  Primary concerns today: GERD, weight gain, CKD stg 4  Referral diagnosis: N18.4,  Preferred learning style: NO preference  Learning readiness: Ready    NUTRITION ASSESSMENT  77 yr old bfemale here with her husband and caretake for GERD, CKD, DM and weight gain. She has Afib; getting conversion tomorrow am.  Not testing blood sugars. A1C 6.2%. On Jardiance.  She and the family are willing to work on following a more whole plant predominant diet with renal restrictions as necessary.  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   Anxiety    Breast cancer (HCC) 2008   right - s/p lumpectomy->chemo, radiation   Depression    Dysrhythmia    hx SVT   Family history of colon cancer    Family history of prostate cancer    GERD (gastroesophageal reflux disease)    H/O: hysterectomy    Hiatal hernia    History of cancer chemotherapy    History of radiation therapy    Hyperlipidemia    Hypertension 01/25/2018   Nonischemic cardiomyopathy (HCC)    Personal history of radiation therapy 01/05/209   SVT (supraventricular tachycardia) (HCC)    short RP SVT documented 5/14   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HCC)    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 30 tablet 0   ELIQUIS 5 MG TABS tablet TAKE 1 TABLET VIA G-TUBE 2 TIMES DAILY. 60 tablet 0   FARXIGA 10 MG TABS tablet Take 1 tablet (10 mg total) by mouth daily before breakfast. 90 tablet 3   furosemide (LASIX) 20 MG tablet Take 2 tablets (40 mg total) by mouth daily. 60 tablet 11    levETIRAcetam (KEPPRA) 500 MG tablet Take 1 tablet (500 mg total) by mouth 2 (two) times daily. 180 tablet 3   pantoprazole (PROTONIX) 40 MG tablet TAKE (1) TABLET BY MOUTH TWICE DAILY. 60 tablet 0   potassium chloride SA (KLOR-CON M) 20 MEQ tablet Take 2 tablets (40 mEq total) by mouth daily. 60 tablet 11   sertraline (ZOLOFT) 25 MG tablet TAKE 1 TABLET BY MOUTH ONCE A DAY. 30 tablet 0   acetaminophen (TYLENOL) 500 MG tablet Place 500-1,000 mg into feeding tube every 6 (six) hours as needed for mild pain.     azelastine (ASTELIN) 0.1 % nasal spray Place 2 sprays into both nostrils 2 (two) times daily. Use in each nostril as directed 30 mL 12   bisacodyl 5 MG EC tablet One tablet every 3 days, if needed, for constipation     clonazePAM (KLONOPIN) 0.5 MG tablet TAKE (1) TABLET BY MOUTH AT BEDTIME. 30 tablet 0   docusate sodium (COLACE) 100 MG capsule Take 1 capsule (100 mg total) by mouth 2 (two) times daily.     hydrocortisone (ANUSOL-HC) 2.5 % rectal cream Place 1 Application rectally 2 (two) times daily. 30 g 3   hydrocortisone (ANUSOL-HC) 25 MG suppository Place 1 suppository (25 mg total) rectally 2 (two) times daily. 12 suppository 3   lacosamide (VIMPAT)  50 MG TABS tablet Take 1 tablet (50 mg total) by mouth 2 (two) times daily. 180 tablet 2   polyethylene glycol (MIRALAX) 17 g packet Take one packet mira lax once daily in 8 ounces water 28 each 5   No current facility-administered medications on file prior to visit.    Labs:  Lab Results  Component Value Date   HGBA1C 6.2 (H) 06/03/2023      Latest Ref Rng & Units 07/17/2023    4:19 PM 07/17/2023    1:56 PM 07/01/2023    3:30 PM  CMP  Glucose 70 - 99 mg/dL 161  97  83   BUN 8 - 23 mg/dL 35  37  24   Creatinine 0.44 - 1.00 mg/dL 0.96  0.45  4.09   Sodium 135 - 145 mmol/L 143  142  146   Potassium 3.5 - 5.1 mmol/L 4.3  4.6  3.2   Chloride 98 - 111 mmol/L 112  108  109   CO2 22 - 32 mmol/L 20  22  20    Calcium 8.9 - 10.3 mg/dL  8.7  8.7  8.7   Total Protein 6.5 - 8.1 g/dL 7.4  6.9  7.0   Total Bilirubin 0.3 - 1.2 mg/dL 1.0  0.9  0.6   Alkaline Phos 38 - 126 U/L 112   169   AST 15 - 41 U/L 54  43  90   ALT 0 - 44 U/L 46  41  76    Lipid Panel     Component Value Date/Time   CHOL 178 12/31/2022 1131   TRIG 81 12/31/2022 1131   HDL 69 12/31/2022 1131   CHOLHDL 2.6 12/31/2022 1131   CHOLHDL 4.1 04/18/2022 0329   VLDL 17 04/18/2022 0329   LDLCALC 94 12/31/2022 1131   LDLCALC 119 (H) 03/22/2020 1358   LABVLDL 15 12/31/2022 1131    Notable Signs/Symptoms: irregular heat beat and fatigue,   Lifestyle & Dietary Hx LIves with husband. Has care takers. Eats 2 meals per day  Estimated daily fluid intake: 30 oz Supplements:  Sleep:  Stress / self-care:  Current average weekly physical activity: ADL- uses a walker.  24-Hr Dietary Recall First Meal: eggs, grits and toast, applesauce and or juice; or cereal-frosted wheat with banana 1/2, or prunes if needed Snack:  Second Meal: Salad with Secretary/administrator, water Snack:  Third Meal: Meat and vegetables; ;prefers meat instead of vegetables. Snack:  Beverages: Water  Estimated Energy Needs Calories: 1200 Carbohydrate: 135g Protein: 90g Fat: 33g   NUTRITION DIAGNOSIS  NB-1.1 Food and nutrition-related knowledge deficit As related to a low calorie, low salt renal diabetic diet.  As evidenced by A1C 6.2%, CKD Stg 4 and BMI 31.   NUTRITION INTERVENTION  Nutrition education (E-1) on the following topics:  Nutrition and Diabetes education provided on My Plate, CHO counting, meal planning, portion sizes, timing of meals, avoiding snacks between meals unless having a low blood sugar, target ranges for A1C and blood sugars, signs/symptoms and treatment of hyper/hypoglycemia, monitoring blood sugars, taking medications as prescribed, benefits of exercising 30 minutes per day and prevention of complications of DM.  Low Salt Renal diet Lifestyle  Medicine  - Whole Food, Plant Predominant Nutrition is highly recommended: Eat Plenty of vegetables, Mushrooms, fruits, Legumes, Whole Grains, Nuts, seeds in lieu of processed meats, processed snacks/pastries red meat, poultry, eggs.    -It is better to avoid simple carbohydrates including: Cakes, Sweet Desserts, Ice Cream, Soda (  diet and regular), Sweet Tea, Candies, Chips, Cookies, Store Bought Juices, Alcohol in Excess of  1-2 drinks a day, Lemonade,  Artificial Sweeteners, Doughnuts, Coffee Creamers, "Sugar-free" Products, etc, etc.  This is not a complete list.....  Exercise: If you are able: 30 -60 minutes a day ,4 days a week, or 150 minutes a week.  The longer the better.  Combine stretch, strength, and aerobic activities.  If you were told in the past that you have high risk for cardiovascular diseases, you may seek evaluation by your heart doctor prior to initiating moderate to intense exercise programs.   Handouts Provided Include  Lifestyle Medicine handouts Renal diet  Learning Style & Readiness for Change Teaching method utilized: Visual & Auditory  Demonstrated degree of understanding via: Teach Back  Barriers to learning/adherence to lifestyle change: None  Goals Established by Pt Cut out processed meats, fat back, fried foods Increase fruits, vegetables and dried beans Drink only water Avoid salty foods. Eat 3 balanced meals per day Increase vegetables and fruit daily.  MONITORING & EVALUATION Dietary intake, weekly physical activity,  in 3 months.  Next Steps  Patient is to work on following a whole plant based low salt diet.

## 2023-08-06 ENCOUNTER — Encounter (HOSPITAL_COMMUNITY)
Admission: RE | Disposition: A | Discharge status: Home / Self Care | Payer: Self-pay | Source: Home / Self Care | Attending: Cardiology

## 2023-08-06 ENCOUNTER — Other Ambulatory Visit: Payer: Self-pay

## 2023-08-06 ENCOUNTER — Ambulatory Visit (HOSPITAL_COMMUNITY)
Admission: RE | Admit: 2023-08-06 | Discharge: 2023-08-06 | Disposition: A | Discharge status: Home / Self Care | Payer: Medicare Other | Attending: Cardiology | Admitting: Cardiology

## 2023-08-06 ENCOUNTER — Telehealth (HOSPITAL_COMMUNITY): Payer: Self-pay

## 2023-08-06 ENCOUNTER — Ambulatory Visit (HOSPITAL_COMMUNITY): Payer: Medicare Other | Admitting: Anesthesiology

## 2023-08-06 ENCOUNTER — Encounter (HOSPITAL_COMMUNITY): Payer: Self-pay | Admitting: Cardiology

## 2023-08-06 ENCOUNTER — Ambulatory Visit (HOSPITAL_BASED_OUTPATIENT_CLINIC_OR_DEPARTMENT_OTHER): Payer: Medicare Other | Admitting: Anesthesiology

## 2023-08-06 DIAGNOSIS — I4891 Unspecified atrial fibrillation: Secondary | ICD-10-CM

## 2023-08-06 DIAGNOSIS — Z853 Personal history of malignant neoplasm of breast: Secondary | ICD-10-CM | POA: Insufficient documentation

## 2023-08-06 DIAGNOSIS — Z7901 Long term (current) use of anticoagulants: Secondary | ICD-10-CM | POA: Insufficient documentation

## 2023-08-06 DIAGNOSIS — I5043 Acute on chronic combined systolic (congestive) and diastolic (congestive) heart failure: Secondary | ICD-10-CM | POA: Diagnosis not present

## 2023-08-06 DIAGNOSIS — Z9011 Acquired absence of right breast and nipple: Secondary | ICD-10-CM | POA: Insufficient documentation

## 2023-08-06 DIAGNOSIS — I130 Hypertensive heart and chronic kidney disease with heart failure and stage 1 through stage 4 chronic kidney disease, or unspecified chronic kidney disease: Secondary | ICD-10-CM | POA: Insufficient documentation

## 2023-08-06 DIAGNOSIS — N183 Chronic kidney disease, stage 3 unspecified: Secondary | ICD-10-CM | POA: Insufficient documentation

## 2023-08-06 DIAGNOSIS — Z79899 Other long term (current) drug therapy: Secondary | ICD-10-CM | POA: Insufficient documentation

## 2023-08-06 DIAGNOSIS — I428 Other cardiomyopathies: Secondary | ICD-10-CM | POA: Insufficient documentation

## 2023-08-06 DIAGNOSIS — F32A Depression, unspecified: Secondary | ICD-10-CM | POA: Insufficient documentation

## 2023-08-06 DIAGNOSIS — K219 Gastro-esophageal reflux disease without esophagitis: Secondary | ICD-10-CM | POA: Diagnosis not present

## 2023-08-06 DIAGNOSIS — I4819 Other persistent atrial fibrillation: Secondary | ICD-10-CM | POA: Insufficient documentation

## 2023-08-06 DIAGNOSIS — I471 Supraventricular tachycardia, unspecified: Secondary | ICD-10-CM | POA: Diagnosis not present

## 2023-08-06 DIAGNOSIS — Z923 Personal history of irradiation: Secondary | ICD-10-CM | POA: Diagnosis not present

## 2023-08-06 DIAGNOSIS — F419 Anxiety disorder, unspecified: Secondary | ICD-10-CM | POA: Diagnosis not present

## 2023-08-06 DIAGNOSIS — Z9221 Personal history of antineoplastic chemotherapy: Secondary | ICD-10-CM | POA: Insufficient documentation

## 2023-08-06 DIAGNOSIS — I480 Paroxysmal atrial fibrillation: Secondary | ICD-10-CM

## 2023-08-06 DIAGNOSIS — I5022 Chronic systolic (congestive) heart failure: Secondary | ICD-10-CM | POA: Diagnosis not present

## 2023-08-06 HISTORY — PX: CARDIOVERSION: EP1203

## 2023-08-06 SURGERY — CARDIOVERSION (CATH LAB)
Anesthesia: General

## 2023-08-06 MED ORDER — SODIUM CHLORIDE 0.9% FLUSH: 10.0000 mL | Freq: Two times a day (BID) | INTRAVENOUS | Status: DC

## 2023-08-06 MED ORDER — LIDOCAINE 2% (20 MG/ML) 5 ML SYRINGE
INTRAMUSCULAR | Status: DC | PRN
  Administered 2023-08-06: 20 mg via INTRAVENOUS

## 2023-08-06 MED ORDER — PROPOFOL 10 MG/ML IV BOLUS
INTRAVENOUS | Status: DC | PRN
  Administered 2023-08-06: 60 mg via INTRAVENOUS

## 2023-08-06 SURGICAL SUPPLY — 1 items: PAD DEFIB RADIO PHYSIO CONN (PAD) ×2 IMPLANT

## 2023-08-06 NOTE — Telephone Encounter (Signed)
Called and spoke to patient's husband Fayrene Fearing to confirm/remind patient of [facility].   Patient reminded to bring all medications and/or complete list.  Confirmed patient has transportation. Gave directions, instructed to utilize valet parking.  Confirmed appointment prior to ending call.

## 2023-08-06 NOTE — Transfer of Care (Signed)
Immediate Anesthesia Transfer of Care Note  Patient: Tara [name]  Procedure(s) Performed: CARDIOVERSION (CATH LAB)  Patient Location: PACU  Anesthesia Type:General  Level of Consciousness: drowsy  Airway & Oxygen Therapy: Patient Spontanous Breathing and Patient connected to face mask oxygen  Post-op Assessment: Report given to RN and Post -op Vital signs reviewed and stable  Post vital signs: Reviewed and stable  Last Vitals:  Vitals Value Taken Time  BP    Temp    Pulse 69 08/06/23 0736  Resp 24 08/06/23 0736  SpO2 90 % 08/06/23 0736  Vitals shown include unfiled device data.  Last Pain:  Vitals:   08/06/23 0641  TempSrc: Tympanic  PainSc: 0-No pain         Complications: No notable events documented.

## 2023-08-06 NOTE — Discharge Instructions (Signed)

## 2023-08-06 NOTE — Interval H&P Note (Signed)
History and Physical Interval Note:  08/06/2023 7:26 AM  Tara [name]  has presented today for surgery, with the diagnosis of AFIB.  The various methods of treatment have been discussed with the patient and family. After consideration of risks, benefits and other options for treatment, the patient has consented to  Procedure(s): CARDIOVERSION (CATH LAB) (N/A) as a surgical intervention.  The patient's history has been reviewed, patient examined, no change in status, stable for surgery.  I have reviewed the patient's chart and labs.  Questions were answered to the patient's satisfaction.     Celese Banner [employer]

## 2023-08-06 NOTE — Anesthesia Postprocedure Evaluation (Signed)
Anesthesia Post Note  Patient: Tara [name]  Procedure(s) Performed: CARDIOVERSION (CATH LAB)     Patient location during evaluation: Cath Lab Anesthesia Type: General Level of consciousness: awake and alert, patient cooperative and oriented Pain management: pain level controlled Vital Signs Assessment: post-procedure vital signs reviewed and stable Respiratory status: spontaneous breathing, nonlabored ventilation and respiratory function stable Cardiovascular status: blood pressure returned to baseline and stable Postop Assessment: no apparent nausea or vomiting and able to ambulate Anesthetic complications: no   No notable events documented.  Last Vitals:  Vitals:   08/06/23 0807 08/06/23 0819  BP: 110/71 113/69  Pulse: (!) 58 (!) 59  Resp: 13 14  Temp:    SpO2: (!) 89% 94%    Last Pain:  Vitals:   08/06/23 0742  TempSrc: Temporal  PainSc: 0-No pain                 Tarron Krolak,E. Jona Erkkila

## 2023-08-06 NOTE — Procedures (Signed)
Electrical Cardioversion Procedure Note Tara [name] [id number] 1946/06/16  Procedure: Electrical Cardioversion Indications:  Atrial Fibrillation  Procedure Details Consent: Risks of procedure as well as the alternatives and risks of each were explained to the (patient/caregiver).  Consent for procedure obtained. Time Out: Verified patient identification, verified procedure, site/side was marked, verified correct patient position, special equipment/implants available, medications/allergies/relevent history reviewed, required imaging and test results available.  Performed  Patient placed on cardiac monitor, pulse oximetry, supplemental oxygen as necessary.  Sedation given:  Propofol per anesthesiology Pacer pads placed anterior and posterior chest.  Cardioverted 1 time(s).  Cardioverted at 200J.  Evaluation Findings: Post procedure EKG shows: NSR Complications: None Patient did tolerate procedure well.   Tara [name] 08/06/2023, 7:31 AM

## 2023-08-07 ENCOUNTER — Ambulatory Visit (HOSPITAL_COMMUNITY)
Admission: RE | Admit: 2023-08-07 | Discharge: 2023-08-07 | Disposition: A | Discharge status: Home / Self Care | Payer: Medicare Other | Source: Ambulatory Visit | Attending: Family Medicine | Admitting: Family Medicine

## 2023-08-07 ENCOUNTER — Encounter (HOSPITAL_COMMUNITY): Payer: Self-pay | Admitting: Cardiology

## 2023-08-07 VITALS — BP 106/68 | HR 68 | Wt 150.6 lb

## 2023-08-07 DIAGNOSIS — M79605 Pain in left leg: Secondary | ICD-10-CM | POA: Diagnosis not present

## 2023-08-07 DIAGNOSIS — R5383 Other fatigue: Secondary | ICD-10-CM | POA: Insufficient documentation

## 2023-08-07 DIAGNOSIS — Z79899 Other long term (current) drug therapy: Secondary | ICD-10-CM | POA: Diagnosis not present

## 2023-08-07 DIAGNOSIS — I4819 Other persistent atrial fibrillation: Secondary | ICD-10-CM | POA: Diagnosis not present

## 2023-08-07 DIAGNOSIS — Z9011 Acquired absence of right breast and nipple: Secondary | ICD-10-CM | POA: Diagnosis not present

## 2023-08-07 DIAGNOSIS — Z86718 Personal history of other venous thrombosis and embolism: Secondary | ICD-10-CM | POA: Diagnosis not present

## 2023-08-07 DIAGNOSIS — I471 Supraventricular tachycardia, unspecified: Secondary | ICD-10-CM | POA: Diagnosis not present

## 2023-08-07 DIAGNOSIS — Z7984 Long term (current) use of oral hypoglycemic drugs: Secondary | ICD-10-CM | POA: Diagnosis not present

## 2023-08-07 DIAGNOSIS — Z7901 Long term (current) use of anticoagulants: Secondary | ICD-10-CM | POA: Insufficient documentation

## 2023-08-07 DIAGNOSIS — I440 Atrioventricular block, first degree: Secondary | ICD-10-CM | POA: Diagnosis not present

## 2023-08-07 DIAGNOSIS — I482 Chronic atrial fibrillation, unspecified: Secondary | ICD-10-CM | POA: Diagnosis not present

## 2023-08-07 DIAGNOSIS — N183 Chronic kidney disease, stage 3 unspecified: Secondary | ICD-10-CM

## 2023-08-07 DIAGNOSIS — Z853 Personal history of malignant neoplasm of breast: Secondary | ICD-10-CM | POA: Insufficient documentation

## 2023-08-07 DIAGNOSIS — R7989 Other specified abnormal findings of blood chemistry: Secondary | ICD-10-CM

## 2023-08-07 DIAGNOSIS — I5022 Chronic systolic (congestive) heart failure: Secondary | ICD-10-CM | POA: Diagnosis not present

## 2023-08-07 DIAGNOSIS — Z9221 Personal history of antineoplastic chemotherapy: Secondary | ICD-10-CM | POA: Diagnosis not present

## 2023-08-07 DIAGNOSIS — I445 Left posterior fascicular block: Secondary | ICD-10-CM | POA: Diagnosis not present

## 2023-08-07 DIAGNOSIS — I428 Other cardiomyopathies: Secondary | ICD-10-CM | POA: Diagnosis not present

## 2023-08-07 LAB — COMPREHENSIVE METABOLIC PANEL
ALT: 67 U/L — ABNORMAL HIGH (ref 0–44)
AST: 79 U/L — ABNORMAL HIGH (ref 15–41)
Albumin: 3.1 g/dL — ABNORMAL LOW (ref 3.5–5.0)
Alkaline Phosphatase: 100 U/L (ref 38–126)
Anion gap: 10 (ref 5–15)
BUN: 37 mg/dL — ABNORMAL HIGH (ref 8–23)
CO2: 23 mmol/L (ref 22–32)
Calcium: 8.9 mg/dL (ref 8.9–10.3)
Chloride: 113 mmol/L — ABNORMAL HIGH (ref 98–111)
Creatinine, Ser: 2.16 mg/dL — ABNORMAL HIGH (ref 0.44–1.00)
GFR, Estimated: 23 mL/min — ABNORMAL LOW (ref >60)
Glucose, Bld: 59 mg/dL — ABNORMAL LOW (ref 70–99)
Potassium: 3.9 mmol/L (ref 3.5–5.1)
Sodium: 146 mmol/L — ABNORMAL HIGH (ref 135–145)
Total Bilirubin: 0.5 mg/dL (ref <1.2)
Total Protein: 7.3 g/dL (ref 6.5–8.1)

## 2023-08-07 LAB — BRAIN NATRIURETIC PEPTIDE: B Natriuretic Peptide: 332.4 pg/mL — ABNORMAL HIGH (ref 0.0–100.0)

## 2023-08-07 MED ORDER — FUROSEMIDE 20 MG PO TABS: 20.0000 mg | ORAL_TABLET | Freq: Every day | ORAL | 11 refills | Status: DC

## 2023-08-07 MED ORDER — POTASSIUM CHLORIDE CRYS ER 20 MEQ PO TBCR: 20.0000 meq | EXTENDED_RELEASE_TABLET | Freq: Every day | ORAL | 11 refills | Status: DC

## 2023-08-07 NOTE — Progress Notes (Signed)
ReDS Vest / Clip - 08/07/23 1400       ReDS Vest / Clip   Station Marker A    Ruler Value 32    ReDS Value Range Low volume    ReDS Actual Value 31    Anatomical Comments sitting

## 2023-08-07 NOTE — Patient Instructions (Addendum)
RedsClip done today.   Labs done today. We will contact you only if your labs are abnormal.  DECREASE Lasix to 20mg  (1 tablet) by mouth daily.   DECREASE Potassium to (1 tablet) by mouth daily.   No other medication changes were made. Please continue all current medications as prescribed.  You have been referred to Cardiac Rehab at [facility]. They will contact you to schedule an appointment.   Please wear your compression hose daily, place them on as soon as you get up in the morning and remove before you go to bed at night.  Your physician has requested that you have a lower extremity venous duplex. This test is an ultrasound of the veins in the legs or arms. It looks at venous blood flow that carries blood from the heart to the legs or arms. Allow one hour for a Lower Venous exam. Allow thirty minutes for an Upper Venous exam. There are no restrictions or special instructions. This has to be approved through your insurance company prior to scheduling, once approved we will contact you to schedule an appointment.   Please note: We ask at that you not bring children with you during ultrasound (echo/ vascular) testing. Due to room size and safety concerns, children are not allowed in the ultrasound rooms during exams. Our front office staff cannot provide observation of children in our lobby area while testing is being conducted. An adult accompanying a patient to their appointment will only be allowed in the ultrasound room at the discretion of the ultrasound technician under special circumstances. We apologize for any inconvenience.  Your physician recommends that you schedule a follow-up appointment in: 3 weeks and in 3 months with Dr. Shirlee Latch with an echo prior to your appointment. Please contact our office in December to schedule a February 2025 appointment.   Your physician has requested that you have an echocardiogram. Echocardiography is a painless test that uses sound waves to  create images of your heart. It provides your doctor with information about the size and shape of your heart and how well your heart's chambers and valves are working. This procedure takes approximately one hour. There are no restrictions for this procedure. Please do NOT wear cologne, perfume, aftershave, or lotions (deodorant is allowed). Please arrive 15 minutes prior to your appointment time.  Please note: We ask at that you not bring children with you during ultrasound (echo/ vascular) testing. Due to room size and safety concerns, children are not allowed in the ultrasound rooms during exams. Our front office staff cannot provide observation of children in our lobby area while testing is being conducted. An adult accompanying a patient to their appointment will only be allowed in the ultrasound room at the discretion of the ultrasound technician under special circumstances. We apologize for any inconvenience.  If you have any questions or concerns before your next appointment please send [place] a message through [place] or call our office at [phone].    TO LEAVE A MESSAGE FOR THE NURSE SELECT OPTION 2, PLEASE LEAVE A MESSAGE INCLUDING: YOUR NAME DATE OF BIRTH CALL BACK NUMBER REASON FOR CALL**this is important as we prioritize the call backs  YOU WILL RECEIVE A CALL BACK THE SAME DAY AS LONG AS YOU CALL BEFORE 4:00 PM   Do the following things EVERYDAY: Weigh yourself in the morning before breakfast. Write it down and keep it in a log. Take your medicines as prescribed Eat low salt foods--Limit salt (sodium) to 2000 mg  per day.  Stay as active as you can everyday Limit all fluids for the day to less than 2 liters   At the [facility], you and your health needs are our priority. As part of our continuing mission to provide you with exceptional heart care, we have created designated Provider Care Teams. These Care Teams include your primary Cardiologist (physician) and  Advanced Practice Providers (APPs- Physician Assistants and Nurse Practitioners) who all work together to provide you with the care you need, when you need it.   You may see any of the following providers on your designated Care Team at your next follow up: Dr Arvilla Meres Dr [name] Dr. Marcos Eke, NP Robbie Lis, [place] [facility] [place], [place] Brynda Peon, NP Karle Plumber, PharmD   Please be sure to bring in all your medications bottles to every appointment.    Thank you for choosing  [facility]

## 2023-08-07 NOTE — Addendum Note (Signed)
Addendum  created 08/07/23 1732 by Jairo Ben, MD   Intraprocedure Event edited, Intraprocedure Staff edited

## 2023-08-09 ENCOUNTER — Other Ambulatory Visit (HOSPITAL_COMMUNITY): Payer: Self-pay | Admitting: Family Medicine

## 2023-08-09 DIAGNOSIS — R918 Other nonspecific abnormal finding of lung field: Secondary | ICD-10-CM

## 2023-08-11 ENCOUNTER — Ambulatory Visit (HOSPITAL_COMMUNITY)
Admission: RE | Admit: 2023-08-11 | Discharge: 2023-08-11 | Disposition: A | Discharge status: Home / Self Care | Payer: Medicare Other | Source: Ambulatory Visit | Attending: Family Medicine | Admitting: Family Medicine

## 2023-08-11 ENCOUNTER — Ambulatory Visit: Payer: Self-pay | Admitting: Family Medicine

## 2023-08-11 ENCOUNTER — Ambulatory Visit (INDEPENDENT_AMBULATORY_CARE_PROVIDER_SITE_OTHER): Payer: Medicare Other | Admitting: Family Medicine

## 2023-08-11 ENCOUNTER — Other Ambulatory Visit: Payer: Self-pay

## 2023-08-11 ENCOUNTER — Encounter: Payer: Self-pay | Admitting: Family Medicine

## 2023-08-11 VITALS — BP 102/63 | HR 66 | Ht <= 58 in | Wt 151.0 lb

## 2023-08-11 DIAGNOSIS — M25562 Pain in left knee: Secondary | ICD-10-CM

## 2023-08-11 DIAGNOSIS — M7989 Other specified soft tissue disorders: Secondary | ICD-10-CM | POA: Diagnosis not present

## 2023-08-11 DIAGNOSIS — M79662 Pain in left lower leg: Secondary | ICD-10-CM

## 2023-08-11 DIAGNOSIS — Z86718 Personal history of other venous thrombosis and embolism: Secondary | ICD-10-CM

## 2023-08-11 DIAGNOSIS — R053 Chronic cough: Secondary | ICD-10-CM | POA: Diagnosis not present

## 2023-08-11 DIAGNOSIS — M25462 Effusion, left knee: Secondary | ICD-10-CM | POA: Diagnosis not present

## 2023-08-11 DIAGNOSIS — M85862 Other specified disorders of bone density and structure, left lower leg: Secondary | ICD-10-CM | POA: Diagnosis not present

## 2023-08-11 DIAGNOSIS — J189 Pneumonia, unspecified organism: Secondary | ICD-10-CM

## 2023-08-11 DIAGNOSIS — M79672 Pain in left foot: Secondary | ICD-10-CM | POA: Diagnosis not present

## 2023-08-11 DIAGNOSIS — J3089 Other allergic rhinitis: Secondary | ICD-10-CM

## 2023-08-11 MED ORDER — BISACODYL 5 MG PO TBEC: DELAYED_RELEASE_TABLET | ORAL | 1 refills | Status: AC

## 2023-08-11 MED ORDER — MONTELUKAST SODIUM 10 MG PO TABS: 10.0000 mg | ORAL_TABLET | Freq: Every day | ORAL | 3 refills | Status: DC

## 2023-08-11 MED ORDER — FLUTICASONE PROPIONATE 50 MCG/ACT NA SUSP: 2.0000 | Freq: Every day | NASAL | 6 refills | Status: DC

## 2023-08-11 NOTE — Telephone Encounter (Addendum)
Copied from CRM [id number]. Topic: Clinical - Red Word Triage >> Aug 11, 2023 10:35 AM [place] Tara [name] wrote: Red Word that prompted transfer to Nurse Triage: red words swelling in knee, spouse Oluwaseyi Liljedahl called stated patient hoping on her foot for 3 days and today 08/11/23 her  left knee swelling and she been coughing . Callback #  [phone]  Chief Complaint: left knee and upper leg swelling Symptoms: swelling  Frequency: started this morning Pertinent Negatives: Patient denies chest pain: husband stated patient has SOB but has SOB all the time: husband stated he did not think SOB was worse at this time Disposition: [] ED /[] Urgent Care (no appt availability in office) / [x] Appointment(In office/virtual)/ []  [facility]/ [] Home Care/ [] Refused Recommended Disposition /[] [place] [facility]/ []  Follow-up with PCP Additional Notes: pt husband called stated someone from office called r/t xray& lung and possibily scheduling visit .  Husband stated now he is calling back because pt is having left knee and upper leg swelling that started this morning.  No chest pain. Hx. Of DVT: stated pt been walking on heel due to pain.  Husband would like pt to be evaluated: appt made in office for today at 1pm.  Husband also stated pt was suppose to have PT today: Nurse advised husband no to allow pt to participate in PT today due to possibility of blood clot: husband verbalized understanding. Reason for Disposition  [1] Thigh, calf, or ankle swelling AND [2] only 1 side  Answer Assessment - Initial Assessment Questions 1. ONSET: "When did the swelling start?" (e.g., minutes, hours, days)     today 2. LOCATION: "What part of the leg is swollen?"  "Are both legs swollen or just one leg?"     Left knee and above knee swelling 3. SEVERITY: "How bad is the swelling?" (e.g., localized; mild, moderate, severe)   - Localized: Small area of swelling localized to one leg.   - MILD pedal edema: Swelling limited  to foot and ankle, pitting edema < 1/4 inch (6 mm) deep, rest and elevation eliminate most or all swelling.   - MODERATE edema: Swelling of lower leg to knee, pitting edema > 1/4 inch (6 mm) deep, rest and elevation only partially reduce swelling.   - SEVERE edema: Swelling extends above knee, facial or hand swelling present.      Pain with movement 4. REDNESS: "Does the swelling look red or infected?"     no 5. PAIN: "Is the swelling painful to touch?" If Yes, ask: "How painful is it?"   (Scale 1-10; mild, moderate or severe)     unknown 6. FEVER: "Do you have a fever?" If Yes, ask: "What is it, how was it measured, and when did it start?"      no 7. CAUSE: "What do you think is causing the leg swelling?"     unknown 8. MEDICAL HISTORY: "Do you have a history of blood clots (e.g., DVT), cancer, heart failure, kidney disease, or liver failure?"     Found blood clot in past 9. RECURRENT SYMPTOM: "Have you had leg swelling before?" If Yes, ask: "When was the last time?" "What happened that time?"     no 10. OTHER SYMPTOMS: "Do you have any other symptoms?" (e.g., chest pain, difficulty breathing)       SOB all the time but has not gotten worse with leg swelling 11. PREGNANCY: "Is there any chance you are pregnant?" "When was your last menstrual period?"  no  Protocols used: Leg Swelling and Edema-A-AH

## 2023-08-11 NOTE — Patient Instructions (Addendum)
  F/U AS BEFORE, CALL IF YOU NEED ME SOONER   Venous doppler LLE , today, pain and swelling of left leg   RAY OF LEFT KNEE TODAY.  YOU WILL BE REFERRED TO ORTHOPEDICS  RE PAIN AND SWELLING OF LEFT KNEE AND LEFT FOOT PAIN  YOU WILL BE CONTACTED RE DATE OF CHEST SCAN  YOU WILL BE REFERRED TO ALLERGIST RE CHRONIC COUGH.  START AND COMMIT TOEVERY DAY MONTELULAST , FLONASE AND ASTELIN, ALL 3 ARE PRESCRIBED

## 2023-08-12 ENCOUNTER — Ambulatory Visit (HOSPITAL_COMMUNITY)
Admission: RE | Admit: 2023-08-12 | Discharge: 2023-08-12 | Disposition: A | Discharge status: Home / Self Care | Payer: Medicare Other | Source: Ambulatory Visit | Attending: Family Medicine | Admitting: Family Medicine

## 2023-08-12 ENCOUNTER — Encounter (HOSPITAL_COMMUNITY): Payer: Medicare Other

## 2023-08-12 DIAGNOSIS — M79605 Pain in left leg: Secondary | ICD-10-CM | POA: Diagnosis not present

## 2023-08-12 DIAGNOSIS — M7989 Other specified soft tissue disorders: Secondary | ICD-10-CM | POA: Insufficient documentation

## 2023-08-12 DIAGNOSIS — M25561 Pain in right knee: Secondary | ICD-10-CM | POA: Insufficient documentation

## 2023-08-12 DIAGNOSIS — I5022 Chronic systolic (congestive) heart failure: Secondary | ICD-10-CM | POA: Insufficient documentation

## 2023-08-12 DIAGNOSIS — M79662 Pain in left lower leg: Secondary | ICD-10-CM | POA: Diagnosis not present

## 2023-08-12 DIAGNOSIS — M25562 Pain in left knee: Secondary | ICD-10-CM | POA: Insufficient documentation

## 2023-08-12 DIAGNOSIS — M79672 Pain in left foot: Secondary | ICD-10-CM | POA: Insufficient documentation

## 2023-08-12 NOTE — Assessment & Plan Note (Signed)
Acute pain and swelling of LLE, has impaired mobility though on eliquis need to r/o DVT, stat venous doppler

## 2023-08-12 NOTE — Assessment & Plan Note (Signed)
X ray of knee and refer ortho, deformed with pain and crepitus , likely osteoarthritis

## 2023-08-12 NOTE — Assessment & Plan Note (Signed)
Chronic cough, no fevr or sputum production. Constant clear  nasal drainage, daily montelukast and flonase added , already on astellin, also referred to Allergist

## 2023-08-12 NOTE — Assessment & Plan Note (Signed)
Chest scan to be scheduled as rept CXR has perisitent abnormality and pt has chronic cough

## 2023-08-12 NOTE — Assessment & Plan Note (Signed)
No known trauma, Ortho to eval and manage

## 2023-08-12 NOTE — Progress Notes (Signed)
   Tara [name]     MRN: [id number]      DOB: [birth year]  Chief Complaint  Patient presents with   Knee Pain    Knee/leg pain swelling x 2-3 days    HPI [name] is here with a 3 day h/o increased pain and swelling of left thigh, leg and knee. No known trauma, has not had a similar problem 3 day h/o left foot pain Chronic cough unchanged, no hemoptysis or increased shortness of breath  Chronic clear nasal drainage, despite daily astellin, this contributing to cough  ROS Denies recent fever or chills. . Denies chest pains, palpitations Denies abdominal pain, nausea, vomiting,diarrhea or constipation.   Denies dysuria, frequency,   No c/o uncontrolled  depression, anxiety or insomnia. Denies skin break down or rash.   PE  BP 102/63 (BP Location: Left Arm, Patient Position: Sitting, Cuff Size: Normal)   Pulse 66   Ht 4\' 9"  (1.448 m)   Wt 151 lb 0.6 oz (68.5 kg)   SpO2 90%   BMI 32.68 kg/m   Patient alert and oriented and in no cardiopulmonary distress.  HEENT: No facial asymmetry, EOMI,     Neck decreased ROM Chest: Clear to auscultation bilaterally.  CVS: S1, S2 no murmurs, no S3.Regular rate.  ABD: Soft non tender.   Ext: LLE swelling with left calf tenderness a, no warmth MS: decreased ROM spine, shoulders, hips and knees.Left knee swollen and deformed  Skin: Intact, no ulcerations or rash noted.  Psych: Good eye contact, normal affect. Memory intact not anxious or depressed appearing.  CNS: CN 2-12 intact, power,  normal throughout.no focal deficits noted.   Assessment & Plan  Pain and swelling of left lower leg Acute pain and swelling of LLE, has impaired mobility though on eliquis need to r/o DVT, stat venous doppler   Acute pain of left knee X ray of knee and refer ortho, deformed with pain and crepitus , likely osteoarthritis  Acute foot pain, left No known trauma, Ortho to eval and manage  Chronic cough Chronic cough, no fevr or sputum  production. Constant clear  nasal drainage, daily montelukast and flonase added , already on astellin, also referred to [employer] acquired pneumonia of left lower lobe of lung Chest scan to be scheduled as rept CXR has perisitent abnormality and pt has chronic cough

## 2023-08-13 ENCOUNTER — Ambulatory Visit (INDEPENDENT_AMBULATORY_CARE_PROVIDER_SITE_OTHER): Payer: Medicare Other | Admitting: Orthopaedic Surgery

## 2023-08-13 ENCOUNTER — Encounter (HOSPITAL_COMMUNITY): Payer: Self-pay

## 2023-08-13 ENCOUNTER — Encounter: Payer: Self-pay | Admitting: Orthopaedic Surgery

## 2023-08-13 DIAGNOSIS — M25562 Pain in left knee: Secondary | ICD-10-CM | POA: Diagnosis not present

## 2023-08-13 DIAGNOSIS — M79672 Pain in left foot: Secondary | ICD-10-CM | POA: Diagnosis not present

## 2023-08-13 DIAGNOSIS — G8929 Other chronic pain: Secondary | ICD-10-CM | POA: Diagnosis not present

## 2023-08-13 NOTE — Progress Notes (Signed)
Subjective:    Patient ID: Tara [name], female    DOB: 1946/01/10, 77 y.o.   MRN: [id number]  HPI She had acute pain of the left knee and ankle which happened about three or four days ago.  She could not stand or try to walk.  She is limited in her walking secondary to other medical issues. She saw Dr. Lodema Hong on 08-11-23.  X-rays were done of the left knee and a Doppler was done which was negative.  She is much better now and has no swelling.  She has had no redness, no numbness.  She had begun on support hose end of last week and has stopped them.   Review of Systems  Constitutional:  Positive for activity change.  Musculoskeletal:  Positive for arthralgias and myalgias.  All other systems reviewed and are negative. For Review of Systems, all other systems reviewed and are negative.  The following is a summary of the past history medically, past history surgically, known current medicines, social history and family history.  This information is gathered electronically by the computer from prior information and documentation.  I review this each visit and have found including this information at this point in the chart is beneficial and informative.   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   Anxiety    Breast cancer (HCC) 2008   right - s/p lumpectomy->chemo, radiation   Depression    Dysrhythmia    hx SVT   Family history of colon cancer    Family history of prostate cancer    GERD (gastroesophageal reflux disease)    H/O: hysterectomy    Hiatal hernia    History of cancer chemotherapy    History of radiation therapy    Hyperlipidemia    Hypertension 01/25/2018   Nonischemic cardiomyopathy (HCC)    Personal history of radiation therapy 01/05/209   SVT (supraventricular tachycardia) (HCC)    short RP SVT documented 5/14   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HCC)    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)   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    Past Surgical History:  Procedure Laterality Date   ABDOMINAL HYSTERECTOMY  1994   fibroids,    BIOPSY  06/19/2016   Procedure: BIOPSY;  Surgeon: Corbin Ade, MD;  Location: [facility];  Service: Endoscopy;;  gastric duodenum   BREAST BIOPSY Right 12/16/2006   malignant   BREAST EXCISIONAL BIOPSY Right 2018   benign lumpectomy   BREAST LUMPECTOMY Right 02/2007   BREAST LUMPECTOMY WITH RADIOACTIVE SEED LOCALIZATION Right 07/29/2017   Procedure: RIGHT BREAST LUMPECTOMY WITH RADIOACTIVE SEED LOCALIZATION;  Surgeon: Claud Kelp, MD;  Location: [facility] OR;  Service: General;  Laterality: Right;   BREAST SURGERY Right 2008   lumpectomy, cancer   CARDIAC CATHETERIZATION     CARDIOVERSION N/A 08/06/2023   Procedure: CARDIOVERSION (CATH LAB);  Surgeon: Laurey Morale, MD;  Location: [facility] INVASIVE CV LAB;  Service: Cardiovascular;  Laterality: N/A;   CHOLECYSTECTOMY  1999   COLONOSCOPY  2008   Dr. Darrick Penna: multiple polyps. Path not available at time of visit.    COLONOSCOPY WITH PROPOFOL N/A 04/25/2014   Dr. Darrick Penna: Multiple tubular adenomas removed. Diverticulosis. Moderate internal hemorrhoids. Next colonoscopy planned for August 2018.   CRANIOTOMY Bilateral 03/24/2022   Procedure: Bifrontal craniotomy for  resection of meningioma;  Surgeon: Coletta Memos, MD;  Location: [facility] OR;  Service: Neurosurgery;  Laterality: Bilateral;   ESOPHAGOGASTRODUODENOSCOPY (EGD) WITH PROPOFOL N/A 06/19/2016   Procedure: ESOPHAGOGASTRODUODENOSCOPY (EGD) WITH PROPOFOL;  Surgeon: Corbin Ade, MD;  [facility];  Service: Endoscopy;  Laterality: N/A;   MASTECTOMY W/ SENTINEL NODE BIOPSY Right 06/09/2018   Procedure: RIGHT TOTAL MASTECTOMY WITH SENTINEL LYMPH NODE BIOPSY;  Surgeon: Claud Kelp, MD;  Location: [facility] OR;  Service: General;  Laterality: Right;   POLYPECTOMY N/A 04/25/2014   Procedure: POLYPECTOMY;   Surgeon: West Bali, MD;  Location: [facility] ORS;  Service: Endoscopy;  Laterality: N/A;  Ascending and Decending Colon x3 , Transverse colon x2, rectal   PORTACATH PLACEMENT N/A 06/09/2018   Procedure: INSERTION PORT-A-CATH;  Surgeon: Claud Kelp, MD;  Location: [facility] OR;  Service: General;  Laterality: N/A;   RIGHT/LEFT HEART CATH AND CORONARY ANGIOGRAPHY N/A 07/10/2017   Procedure: RIGHT/LEFT HEART CATH AND CORONARY ANGIOGRAPHY;  Surgeon: Laurey Morale, MD;  Location[facility] INVASIVE CV LAB;  Service: Cardiovascular;  Laterality: N/A;    Current Outpatient Medications on File Prior to Visit  Medication Sig Dispense Refill   acetaminophen (TYLENOL) 500 MG tablet Place 500-1,000 mg into feeding tube every 6 (six) hours as needed for mild pain.     amiodarone (PACERONE) 200 MG tablet TAKE 1 TABLET VIA G-TUBE ONCE DAILY. 30 tablet 0   apixaban (ELIQUIS) 5 MG TABS tablet Take 5 mg by mouth 2 (two) times daily.     azelastine (ASTELIN) 0.1 % nasal spray Place 2 sprays into both nostrils 2 (two) times daily. Use in each nostril as directed 30 mL 12   bisacodyl 5 MG EC tablet One tablet every 3 days, if needed, for constipation 30 tablet 1   clonazePAM (KLONOPIN) 0.5 MG tablet TAKE (1) TABLET BY MOUTH AT BEDTIME. 30 tablet 0   docusate sodium (COLACE) 100 MG capsule Take 1 capsule (100 mg total) by mouth 2 (two) times daily.     FARXIGA 10 MG TABS tablet Take 1 tablet (10 mg total) by mouth daily before breakfast. 90 tablet 3   fluticasone (FLONASE) 50 MCG/ACT nasal spray Place 2 sprays into both nostrils daily. 16 g 6   furosemide (LASIX) 20 MG tablet Take 1 tablet (20 mg total) by mouth daily. 30 tablet 11   hydrocortisone (ANUSOL-HC) 2.5 % rectal cream Place 1 Application rectally 2 (two) times daily. 30 g 3   hydrocortisone (ANUSOL-HC) 25 MG suppository Place 1 suppository (25 mg total) rectally 2 (two) times daily. 12 suppository 3   lacosamide (VIMPAT) 50 MG TABS tablet Take 1 tablet (50 mg total)  by mouth 2 (two) times daily. 180 tablet 2   levETIRAcetam (KEPPRA) 500 MG tablet Take 1 tablet (500 mg total) by mouth 2 (two) times daily. 180 tablet 3   montelukast (SINGULAIR) 10 MG tablet Take 1 tablet (10 mg total) by mouth at bedtime. 30 tablet 3   pantoprazole (PROTONIX) 40 MG tablet TAKE (1) TABLET BY MOUTH TWICE DAILY. 60 tablet 0   polyethylene glycol (MIRALAX) 17 g packet Take one packet mira lax once daily in 8 ounces water 28 each 5   potassium chloride SA (KLOR-CON M) 20 MEQ tablet Take 1 tablet (20 mEq total) by mouth daily. 30 tablet 11   sertraline (ZOLOFT) 25 MG tablet TAKE 1 TABLET BY MOUTH ONCE A DAY. 30 tablet 0   No current facility-administered medications on file prior to visit.  Social History   Socioeconomic History   Marital status: Married    Spouse name: Not on file   Number of children: 2   Years of education: Not on file   Highest education level: Not on file  Occupational History   Occupation: disabled     Employer: UNEMPLOYED  Tobacco Use   Smoking status: Never   Smokeless tobacco: Never  Vaping Use   Vaping status: Never Used  Substance and Sexual Activity   Alcohol use: No   Drug use: No   Sexual activity: Not on file  Other Topics Concern   Not on file  Social History Narrative   Lives in [place] with family.  Does not routinely exercise. Right handed   Caffeine-none   Social Determinants of Health   Financial Resource Strain: Low Risk  (07/07/2022)   Received from [facility], [facility]   Overall Financial Resource Strain (CARDIA)    Difficulty of Paying Living Expenses: Not hard at all  Food Insecurity: Patient Declined (06/19/2023)   Hunger Vital Sign    Worried About Running Out of Food in the Last Year: Patient declined    Ran Out of Food in the Last Year: Patient declined  Transportation Needs: Patient Declined (06/19/2023)   PRAPARE - Administrator, Civil Service (Medical): Patient declined    Lack of  Transportation (Non-Medical): Patient declined  Physical Activity: Insufficiently Active (05/01/2021)   Exercise Vital Sign    Days of Exercise per Week: 3 days    Minutes of Exercise per Session: 30 min  Stress: No Stress Concern Present (07/07/2022)   Received from [facility], [facility]   [employer] of Occupational Health - Occupational Stress Questionnaire    Feeling of Stress : Not at all  Social Connections: Unknown (07/06/2022)   Received from [facility], [facility]   Social Network    Social Network: Not on file  Intimate Partner Violence: Patient Declined (06/19/2023)   Humiliation, Afraid, Rape, and Kick questionnaire    Fear of Current or Ex-Partner: Patient declined    Emotionally Abused: Patient declined    Physically Abused: Patient declined    Sexually Abused: Patient declined    Family History  Problem Relation Age of Onset   Dementia Mother    Stroke Mother 78       left hemiparesis   Colon cancer Father 35       died at age 61   Breast cancer Sister    Hypertension Sister    Heart disease Sister    Cancer Sister        unknown form   Cancer Paternal Aunt        NOS   Other Paternal Aunt        old age   Lung cancer Paternal Uncle    Other Paternal Uncle        lightning strike   Other Paternal Uncle        hit by train   Cancer Cousin        NOS pat first cousin   Cancer Cousin        NOS pat first cousin   Prostate cancer Cousin        pat first cousin   Cancer Brother 73       prostate    There were no vitals taken for this visit.  There is no height or weight on file to calculate BMI.  Objective:   Physical Exam Vitals and nursing note reviewed. Exam conducted with a chaperone present.  Constitutional:      Appearance: She is well-developed.  HENT:     Head: Normocephalic and atraumatic.  Eyes:     Conjunctiva/sclera: Conjunctivae normal.     Pupils: Pupils are equal, round, and reactive to light.   Cardiovascular:     Rate and Rhythm: Normal rate and regular rhythm.  Pulmonary:     Effort: Pulmonary effort is normal.  Abdominal:     Palpations: Abdomen is soft.  Musculoskeletal:     Cervical back: Normal range of motion and neck supple.       Legs:     Comments: She is in wheelchair and prefers not to stand.  Skin:    General: Skin is warm and dry.  Neurological:     Mental Status: She is alert and oriented to person, place, and time.     Cranial Nerves: No cranial nerve deficit.     Motor: No abnormal muscle tone.     Coordination: Coordination normal.     Deep Tendon Reflexes: Reflexes are normal and symmetric. Reflexes normal.  Psychiatric:        Behavior: Behavior normal.        Thought Content: Thought content normal.        Judgment: Judgment normal.    I have independently reviewed and interpreted x-rays of this patient done at another site by another physician or qualified health professional.  I have read the notes of Dr. Lodema Hong and reviewed the Doppler.       Assessment & Plan:   Encounter Diagnoses  Name Primary?   Chronic pain of left knee Yes   Acute foot pain, left    Exam is negative today.  She has no pain.  She has no swelling.  I told them to observe it.  Call if it recurs or gets worse.  Call if any problem.  Precautions discussed.  Electronically Signed Darreld Mclean, MD 11/21/202410:01 AM

## 2023-08-17 ENCOUNTER — Encounter (HOSPITAL_COMMUNITY)
Admission: RE | Admit: 2023-08-17 | Discharge: 2023-08-17 | Disposition: A | Discharge status: Home / Self Care | Payer: Medicare Other | Source: Ambulatory Visit | Attending: Cardiology | Admitting: Cardiology

## 2023-08-17 VITALS — Ht <= 58 in | Wt 136.6 lb

## 2023-08-17 DIAGNOSIS — I5022 Chronic systolic (congestive) heart failure: Secondary | ICD-10-CM

## 2023-08-17 NOTE — Patient Instructions (Signed)
Patient Instructions  Patient Details  Name: Tara [name] MRN: [id number] Date of Birth: 1945/12/21 Referring Provider:  Laurey Morale, MD  Below are your personal goals for exercise, nutrition, and risk factors. Our goal is to help you stay on track towards obtaining and maintaining these goals. We will be discussing your progress on these goals with you throughout the program.  Initial Exercise Prescription:  Initial Exercise Prescription - 08/17/23 1300       Date of Initial Exercise RX and Referring Provider   Date 08/17/23    Referring Provider [name] MD      Oxygen   Maintain Oxygen Saturation 88% or higher      NuStep   Level 1    SPM 60    Minutes 30    METs 1.3      Prescription Details   Frequency (times per week) 2    Duration Progress to 30 minutes of continuous aerobic without signs/symptoms of physical distress      Intensity   THRR 40-80% of Max Heartrate 90-128    Ratings of Perceived Exertion 11-13    Perceived Dyspnea 0-4      Progression   Progression Continue to progress workloads to maintain intensity without signs/symptoms of physical distress.      Resistance Training   Training Prescription Yes    Weight 2 lb    Reps 10-15             Exercise Goals: Frequency: Be able to perform aerobic exercise two to three times per week in program working toward 2-5 days per week of home exercise.  Intensity: Work with a perceived exertion of 11 (fairly light) - 15 (hard) while following your exercise prescription.  We will make changes to your prescription with you as you progress through the program.   Duration: Be able to do 30 to 45 minutes of continuous aerobic exercise in addition to a 5 minute warm-up and a 5 minute cool-down routine.   Nutrition Goals: Your personal nutrition goals will be established when you do your nutrition analysis with the dietician.  The following are general nutrition guidelines to follow: Cholesterol  < 200mg /day Sodium < 1500mg /day Fiber: Women under 50 yrs - 25 grams per day  Personal Goals:  Personal Goals and Risk Factors at Admission - 08/17/23 1306       Core Components/Risk Factors/Patient Goals on Admission    Weight Management Weight Maintenance;Yes    Intervention Weight Management: Develop a combined nutrition and exercise program designed to reach desired caloric intake, while maintaining appropriate intake of nutrient and fiber, sodium and fats, and appropriate energy expenditure required for the weight goal.;Weight Management: Provide education and appropriate resources to help participant work on and attain dietary goals.    Admit Weight 136 lb 9.6 oz (62 kg)    Goal Weight: Short Term 136 lb (61.7 kg)    Goal Weight: Long Term 136 lb (61.7 kg)    Expected Outcomes Weight Maintenance: Understanding of the daily nutrition guidelines, which includes 25-35% calories from fat, 7% or less cal from saturated fats, less than 200mg  cholesterol, less than 1.5gm of sodium, & 5 or more servings of fruits and vegetables daily;Long Term: Adherence to nutrition and physical activity/exercise program aimed toward attainment of established weight goal;Short Term: Continue to assess and modify interventions until short term weight is achieved    Improve shortness of breath with ADL's Yes    Intervention Provide education, individualized exercise  plan and daily activity instruction to help decrease symptoms of SOB with activities of daily living.    Expected Outcomes Short Term: Improve cardiorespiratory fitness to achieve a reduction of symptoms when performing ADLs;Long Term: Be able to perform more ADLs without symptoms or delay the onset of symptoms    Heart Failure Yes    Intervention Provide a combined exercise and nutrition program that is supplemented with education, support and counseling about heart failure. Directed toward relieving symptoms such as shortness of breath, decreased  exercise tolerance, and extremity edema.    Expected Outcomes Improve functional capacity of life;Short term: Attendance in program 2-3 days a week with increased exercise capacity. Reported lower sodium intake. Reported increased fruit and vegetable intake. Reports medication compliance.;Short term: Daily weights obtained and reported for increase. Utilizing diuretic protocols set by physician.;Long term: Adoption of self-care skills and reduction of barriers for early signs and symptoms recognition and intervention leading to self-care maintenance.    Hypertension Yes    Intervention Provide education on lifestyle modifcations including regular physical activity/exercise, weight management, moderate sodium restriction and increased consumption of fresh fruit, vegetables, and low fat dairy, alcohol moderation, and smoking cessation.;Monitor prescription use compliance.    Expected Outcomes Short Term: Continued assessment and intervention until BP is < 140/78mm HG in hypertensive participants. < 130/73mm HG in hypertensive participants with diabetes, heart failure or chronic kidney disease.;Long Term: Maintenance of blood pressure at goal levels.             Tobacco Use Initial Evaluation: Social History   Tobacco Use  Smoking Status Never  Smokeless Tobacco Never    Exercise Goals and Review:  Exercise Goals     Row Name 08/17/23 1335             Exercise Goals   Increase Physical Activity Yes       Intervention Provide advice, education, support and counseling about physical activity/exercise needs.;Develop an individualized exercise prescription for aerobic and resistive training based on initial evaluation findings, risk stratification, comorbidities and participant's personal goals.       Expected Outcomes Short Term: Attend rehab on a regular basis to increase amount of physical activity.;Long Term: Add in home exercise to make exercise part of routine and to increase amount of  physical activity.;Long Term: Exercising regularly at least 3-5 days a week.       Increase Strength and Stamina Yes       Intervention Provide advice, education, support and counseling about physical activity/exercise needs.;Develop an individualized exercise prescription for aerobic and resistive training based on initial evaluation findings, risk stratification, comorbidities and participant's personal goals.       Expected Outcomes Short Term: Increase workloads from initial exercise prescription for resistance, speed, and METs.;Short Term: Perform resistance training exercises routinely during rehab and add in resistance training at home;Long Term: Improve cardiorespiratory fitness, muscular endurance and strength as measured by increased METs and functional capacity ( )       Able to understand and use rate of perceived exertion (RPE) scale Yes       Intervention Provide education and explanation on how to use RPE scale       Expected Outcomes Short Term: Able to use RPE daily in rehab to express subjective intensity level;Long Term:  Able to use RPE to guide intensity level when exercising independently       Able to understand and use Dyspnea scale Yes       Intervention Provide education and  explanation on how to use Dyspnea scale       Expected Outcomes Short Term: Able to use Dyspnea scale daily in rehab to express subjective sense of shortness of breath during exertion;Long Term: Able to use Dyspnea scale to guide intensity level when exercising independently       Knowledge and understanding of Target Heart Rate Range (THRR) Yes       Intervention Provide education and explanation of THRR including how the numbers were predicted and where they are located for reference       Expected Outcomes Short Term: Able to state/look up THRR;Long Term: Able to use THRR to govern intensity when exercising independently;Short Term: Able to use daily as guideline for intensity in rehab       Able to  check pulse independently Yes       Intervention Provide education and demonstration on how to check pulse in carotid and radial arteries.;Review the importance of being able to check your own pulse for safety during independent exercise       Expected Outcomes Short Term: Able to explain why pulse checking is important during independent exercise;Long Term: Able to check pulse independently and accurately       Understanding of Exercise Prescription Yes       Intervention Provide education, explanation, and written materials on patient's individual exercise prescription       Expected Outcomes Short Term: Able to explain program exercise prescription;Long Term: Able to explain home exercise prescription to exercise independently                Copy of goals given to participant.

## 2023-08-17 NOTE — Progress Notes (Signed)
Cardiac Individual Treatment Plan  Patient Details  Name: Tara [name] MRN: [id number] Date of Birth: 11/11/1945 Referring Provider:   Flowsheet Row CARDIAC REHAB PHASE II ORIENTATION from 08/17/2023 in [facility] CARDIAC REHABILITATION  Referring Provider [name] MD       Initial Encounter Date:  Flowsheet Row CARDIAC REHAB PHASE II ORIENTATION from 08/17/2023 in [place] CARDIAC REHABILITATION  Date 08/17/23       Visit Diagnosis: Chronic systolic CHF (congestive heart failure) (HCC)  Patient's Home Medications on Admission:  Current Outpatient Medications:    acetaminophen (TYLENOL) 500 MG tablet, Place 500-1,000 mg into feeding tube every 6 (six) hours as needed for mild pain., Disp: , Rfl:    amiodarone (PACERONE) 200 MG tablet, TAKE 1 TABLET VIA G-TUBE ONCE DAILY., Disp: 30 tablet, Rfl: 0   apixaban (ELIQUIS) 5 MG TABS tablet, Take 5 mg by mouth 2 (two) times daily., Disp: , Rfl:    azelastine (ASTELIN) 0.1 % nasal spray, Place 2 sprays into both nostrils 2 (two) times daily. Use in each nostril as directed, Disp: 30 mL, Rfl: 12   bisacodyl 5 MG EC tablet, One tablet every 3 days, if needed, for constipation, Disp: 30 tablet, Rfl: 1   clonazePAM (KLONOPIN) 0.5 MG tablet, TAKE (1) TABLET BY MOUTH AT BEDTIME., Disp: 30 tablet, Rfl: 0   docusate sodium (COLACE) 100 MG capsule, Take 1 capsule (100 mg total) by mouth 2 (two) times daily., Disp: , Rfl:    FARXIGA 10 MG TABS tablet, Take 1 tablet (10 mg total) by mouth daily before breakfast., Disp: 90 tablet, Rfl: 3   fluticasone (FLONASE) 50 MCG/ACT nasal spray, Place 2 sprays into both nostrils daily., Disp: 16 g, Rfl: 6   furosemide (LASIX) 20 MG tablet, Take 1 tablet (20 mg total) by mouth daily., Disp: 30 tablet, Rfl: 11   hydrocortisone (ANUSOL-HC) 2.5 % rectal cream, Place 1 Application rectally 2 (two) times daily., Disp: 30 g, Rfl: 3   hydrocortisone (ANUSOL-HC) 25 MG suppository, Place 1 suppository (25 mg  total) rectally 2 (two) times daily., Disp: 12 suppository, Rfl: 3   lacosamide (VIMPAT) 50 MG TABS tablet, Take 1 tablet (50 mg total) by mouth 2 (two) times daily., Disp: 180 tablet, Rfl: 2   levETIRAcetam (KEPPRA) 500 MG tablet, Take 1 tablet (500 mg total) by mouth 2 (two) times daily., Disp: 180 tablet, Rfl: 3   montelukast (SINGULAIR) 10 MG tablet, Take 1 tablet (10 mg total) by mouth at bedtime., Disp: 30 tablet, Rfl: 3   pantoprazole (PROTONIX) 40 MG tablet, TAKE (1) TABLET BY MOUTH TWICE DAILY., Disp: 60 tablet, Rfl: 0   polyethylene glycol (MIRALAX) 17 g packet, Take one packet mira lax once daily in 8 ounces water, Disp: 28 each, Rfl: 5   potassium chloride SA (KLOR-CON M) 20 MEQ tablet, Take 1 tablet (20 mEq total) by mouth daily., Disp: 30 tablet, Rfl: 11   sertraline (ZOLOFT) 25 MG tablet, TAKE 1 TABLET BY MOUTH ONCE A DAY., Disp: 30 tablet, Rfl: 0  Past Medical History: 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   Anxiety    Breast cancer (HCC) 2008   right - s/p lumpectomy->chemo, radiation   Depression    Dysrhythmia    hx SVT   Family history of colon cancer    Family history of prostate cancer    GERD (gastroesophageal reflux disease)    H/O: hysterectomy  Hiatal hernia    History of cancer chemotherapy    History of radiation therapy    Hyperlipidemia    Hypertension 01/25/2018   Nonischemic cardiomyopathy (HCC)    Personal history of radiation therapy 01/05/209   SVT (supraventricular tachycardia) (HCC)    short RP SVT documented 5/14   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HCC)    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)   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    Tobacco Use: Social History   Tobacco Use  Smoking Status Never  Smokeless Tobacco Never    Labs: Review Flowsheet  More data exists      Latest Ref Rng & Units 03/24/2022  04/18/2022 12/31/2022 06/03/2023 06/24/2023  Labs for ITP Cardiac and Pulmonary Rehab  Cholestrol 100 - 199 mg/dL - 73  604  - -  LDL (calc) 0 - 99 mg/dL - 38  94  - -  HDL-C >54 mg/dL - 18  69  - -  Trlycerides 0 - 149 mg/dL - 85  81  - -  Hemoglobin A1c 4.8 - 5.6 % - 5.9  6.1  6.2  -  PH, Arterial 7.35 - 7.45 7.359  7.381  - - - -  PCO2 arterial 32 - 48 mmHg 40.6  39.2  - - - -  Bicarbonate 20.0 - 28.0 mmol/L 23.4  23.8  - - - -  TCO2 22 - 32 mmol/L 25  25  - - - 18   Acid-base deficit 0.0 - 2.0 mmol/L 3.0  2.0  - - - -  O2 Saturation % 100  100  - - - -    Details       Multiple values from one day are sorted in reverse-chronological order         Capillary Blood Glucose: Lab Results  Component Value Date   GLUCAP 86 06/22/2023   GLUCAP 91 06/21/2023   GLUCAP 78 09/22/2022   GLUCAP 91 09/22/2022   GLUCAP 114 (H) 09/22/2022     Exercise Target Goals: Exercise Program Goal: Individual exercise prescription set using results from initial 6 min walk test and THRR while considering  patient's activity barriers and safety.   Exercise Prescription Goal: Starting with aerobic activity 30 plus minutes a day, 3 days per week for initial exercise prescription. Provide home exercise prescription and guidelines that participant acknowledges understanding prior to discharge.  Activity Barriers & Risk Stratification:  Activity Barriers & Cardiac Risk Stratification - 08/17/23 1119       Activity Barriers & Cardiac Risk Stratification   Activity Barriers Left Knee Replacement;Deconditioning;Shortness of Breath;Balance Concerns;Muscular Weakness    Cardiac Risk Stratification Moderate             6 Minute Walk:  6 Minute Walk     Row Name 08/17/23 1332         6 Minute Walk   Phase Initial  NuStep Test     Distance 193 feet  steps (distane 0.11 miles)     Walk Time 6 minutes     # of Rest Breaks 0     MPH 32  steps per minutes     METS 1.3     RPE 11     VO2 Peak  4.55     Symptoms Yes (comment)     Comments fatigued at end     Resting HR 55 bpm  Resting BP 80/50     Resting Oxygen Saturation  91 %     Exercise Oxygen Saturation  during 6 min walk 96 %     Max Ex. HR 102 bpm     Max Ex. BP 102/62     2 Minute Post BP 94/62              Oxygen Initial Assessment:   Oxygen Re-Evaluation:   Oxygen Discharge (Final Oxygen Re-Evaluation):   Initial Exercise Prescription:  Initial Exercise Prescription - 08/17/23 1300       Date of Initial Exercise RX and Referring Provider   Date 08/17/23    Referring Provider [name] MD      Oxygen   Maintain Oxygen Saturation 88% or higher      NuStep   Level 1    SPM 60    Minutes 30    METs 1.3      Prescription Details   Frequency (times per week) 2    Duration Progress to 30 minutes of continuous aerobic without signs/symptoms of physical distress      Intensity   THRR 40-80% of Max Heartrate 90-128    Ratings of Perceived Exertion 11-13    Perceived Dyspnea 0-4      Progression   Progression Continue to progress workloads to maintain intensity without signs/symptoms of physical distress.      Resistance Training   Training Prescription Yes    Weight 2 lb    Reps 10-15             Perform Capillary Blood Glucose checks as needed.  Exercise Prescription Changes:   Exercise Prescription Changes     Row Name 08/17/23 1300             Response to Exercise   Blood Pressure (Admit) 80/50       Blood Pressure (Exercise) 102/62       Blood Pressure (Exit) 94/62       Heart Rate (Admit) 55 bpm       Heart Rate (Exercise) 102 bpm       Heart Rate (Exit) 53 bpm       Oxygen Saturation (Admit) 91 %       Oxygen Saturation (Exercise) 96 %       Rating of Perceived Exertion (Exercise) 11       Symptoms fatigued at end       Comments NuStep test results                Exercise Comments:   Exercise Goals and Review:   Exercise Goals     Row Name  08/17/23 1335             Exercise Goals   Increase Physical Activity Yes       Intervention Provide advice, education, support and counseling about physical activity/exercise needs.;Develop an individualized exercise prescription for aerobic and resistive training based on initial evaluation findings, risk stratification, comorbidities and participant's personal goals.       Expected Outcomes Short Term: Attend rehab on a regular basis to increase amount of physical activity.;Long Term: Add in home exercise to make exercise part of routine and to increase amount of physical activity.;Long Term: Exercising regularly at least 3-5 days a week.       Increase Strength and Stamina Yes       Intervention Provide advice, education, support and counseling about physical activity/exercise needs.;Develop an individualized exercise prescription for aerobic  and resistive training based on initial evaluation findings, risk stratification, comorbidities and participant's personal goals.       Expected Outcomes Short Term: Increase workloads from initial exercise prescription for resistance, speed, and METs.;Short Term: Perform resistance training exercises routinely during rehab and add in resistance training at home;Long Term: Improve cardiorespiratory fitness, muscular endurance and strength as measured by increased METs and functional capacity ( )       Able to understand and use rate of perceived exertion (RPE) scale Yes       Intervention Provide education and explanation on how to use RPE scale       Expected Outcomes Short Term: Able to use RPE daily in rehab to express subjective intensity level;Long Term:  Able to use RPE to guide intensity level when exercising independently       Able to understand and use Dyspnea scale Yes       Intervention Provide education and explanation on how to use Dyspnea scale       Expected Outcomes Short Term: Able to use Dyspnea scale daily in rehab to express  subjective sense of shortness of breath during exertion;Long Term: Able to use Dyspnea scale to guide intensity level when exercising independently       Knowledge and understanding of Target Heart Rate Range (THRR) Yes       Intervention Provide education and explanation of THRR including how the numbers were predicted and where they are located for reference       Expected Outcomes Short Term: Able to state/look up THRR;Long Term: Able to use THRR to govern intensity when exercising independently;Short Term: Able to use daily as guideline for intensity in rehab       Able to check pulse independently Yes       Intervention Provide education and demonstration on how to check pulse in carotid and radial arteries.;Review the importance of being able to check your own pulse for safety during independent exercise       Expected Outcomes Short Term: Able to explain why pulse checking is important during independent exercise;Long Term: Able to check pulse independently and accurately       Understanding of Exercise Prescription Yes       Intervention Provide education, explanation, and written materials on patient's individual exercise prescription       Expected Outcomes Short Term: Able to explain program exercise prescription;Long Term: Able to explain home exercise prescription to exercise independently                Exercise Goals Re-Evaluation :    Discharge Exercise Prescription (Final Exercise Prescription Changes):  Exercise Prescription Changes - 08/17/23 1300       Response to Exercise   Blood Pressure (Admit) 80/50    Blood Pressure (Exercise) 102/62    Blood Pressure (Exit) 94/62    Heart Rate (Admit) 55 bpm    Heart Rate (Exercise) 102 bpm    Heart Rate (Exit) 53 bpm    Oxygen Saturation (Admit) 91 %    Oxygen Saturation (Exercise) 96 %    Rating of Perceived Exertion (Exercise) 11    Symptoms fatigued at end    Comments NuStep test results             Nutrition:   Target Goals: Understanding of nutrition guidelines, daily intake of sodium 1500mg , cholesterol 200mg , calories 30% from fat and 7% or less from saturated fats, daily to have 5 or more servings of fruits and vegetables.  Biometrics:  Pre Biometrics - 08/17/23 1336       Pre Biometrics   Height 4\' 9"  (1.448 m)    Weight 62 kg    Waist Circumference 37 inches    Hip Circumference 49 inches    Waist to Hip Ratio 0.76 %    BMI (Calculated) 29.55    Grip Strength 20.3 kg              Nutrition Therapy Plan and Nutrition Goals:   Nutrition Assessments:  MEDIFICTS Score Key: >=70 Need to make dietary changes  40-70 Heart Healthy Diet <= 40 Therapeutic Level Cholesterol Diet  Flowsheet Row CARDIAC REHAB PHASE II ORIENTATION from 08/17/2023 in [facility] CARDIAC REHABILITATION  Picture Your Plate Total Score on Admission 23      Picture Your Plate Scores: <16 Unhealthy dietary pattern with much room for improvement. 41-50 Dietary pattern unlikely to meet recommendations for good health and room for improvement. 51-60 More healthful dietary pattern, with some room for improvement.  >60 Healthy dietary pattern, although there may be some specific behaviors that could be improved.    Nutrition Goals Re-Evaluation:   Nutrition Goals Discharge (Final Nutrition Goals Re-Evaluation):   Psychosocial: Target Goals: Acknowledge presence or absence of significant depression and/or stress, maximize coping skills, provide positive support system. Participant is able to verbalize types and ability to use techniques and skills needed for reducing stress and depression.  Initial Review & Psychosocial Screening:  Initial Psych Review & Screening - 08/17/23 1308       Initial Review   Current issues with None Identified      Family Dynamics   Good Support System? Yes      Barriers   Psychosocial barriers to participate in program The patient should benefit from training in  stress management and relaxation.;There are no identifiable barriers or psychosocial needs.      Screening Interventions   Interventions Encouraged to exercise;To provide support and resources with identified psychosocial needs;Provide feedback about the scores to participant    Expected Outcomes Short Term goal: Utilizing psychosocial counselor, staff and physician to assist with identification of specific Stressors or current issues interfering with healing process. Setting desired goal for each stressor or current issue identified.;Long Term Goal: Stressors or current issues are controlled or eliminated.;Short Term goal: Identification and review with participant of any Quality of Life or Depression concerns found by scoring the questionnaire.;Long Term goal: The participant improves quality of Life and PHQ9 Scores as seen by post scores and/or verbalization of changes             Quality of Life Scores:  Quality of Life - 08/17/23 1336       Quality of Life   Select Quality of Life      Quality of Life Scores   Health/Function Pre 24.4 %    Socioeconomic Pre 29.25 %    Psych/Spiritual Pre 30 %    Family Pre 28.8 %    GLOBAL Pre 27.26 %            Scores of 19 and below usually indicate a poorer quality of life in these areas.  A difference of  2-3 points is a clinically meaningful difference.  A difference of 2-3 points in the total score of the Quality of Life Index has been associated with significant improvement in overall quality of life, self-image, physical symptoms, and general health in studies assessing change in quality of life.  PHQ-9: Review Flowsheet  More data exists      08/17/2023 08/11/2023 08/05/2023 07/22/2023 07/01/2023  Depression screen PHQ 2/9  Decreased Interest 1 0 0 0 0 0  Down, Depressed, Hopeless 0 0 0 0 0 0  PHQ - 2 Score 1 0 0 0 0 0  Altered sleeping 0 - - - -  Tired, decreased energy 1 - - - -  Change in appetite 1 - - - -  Feeling bad or  failure about yourself  0 - - - -  Trouble concentrating 0 - - - -  Moving slowly or fidgety/restless 0 - - - -  Suicidal thoughts 0 - - - -  PHQ-9 Score 3 - - - -  Difficult doing work/chores Somewhat difficult - - - -    Details       Multiple values from one day are sorted in reverse-chronological order        Interpretation of Total Score  Total Score Depression Severity:  1-4 = Minimal depression, 5-9 = Mild depression, 10-14 = Moderate depression, 15-19 = Moderately severe depression, 20-27 = Severe depression   Psychosocial Evaluation and Intervention:  Psychosocial Evaluation - 08/17/23 1308       Psychosocial Evaluation & Interventions   Interventions Stress management education;Relaxation education;Encouraged to exercise with the program and follow exercise prescription    Comments Patient was referred to CR with chronic systolic HF. She was accompained today by her husband and paid caregiver. Her caregiver completed all of her documentation and she and patient's husband answered most questions. Patient was able to sign for herself. They denied any depression or anxiety or major stressors. She is taking Zoloft and clonezapam a bedtime. They said she had been on these medications a long time. She says she sleeps well. She is a retired [employer] fundraiser. She was in a wheelchair today with a gait belt. Her husband says she is able to transfer herself at home using a walker. She did need assistance today transfering from the wheelchair to the nustep. Her goals for the program are to improve her strength and stamina and improve her mobility. 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 Her knee pain and impaired mobility could be a barrier for completeing the program.    Expected Outcomes Short Term: start the program and attend consistently. Long Term: meet her persoanl goals.    Continue Psychosocial Services  Follow up required by staff             Psychosocial  Re-Evaluation:   Psychosocial Discharge (Final Psychosocial Re-Evaluation):   Vocational Rehabilitation: Provide vocational rehab assistance to qualifying candidates.   Vocational Rehab Evaluation & Intervention:  Vocational Rehab - 08/17/23 1305       Initial Vocational Rehab Evaluation & Intervention   Assessment shows need for Vocational Rehabilitation No      Vocational Rehab Re-Evaulation   Comments Retired.             Education: Education Goals: Education classes will be provided on a weekly basis, covering required topics. Participant will state understanding/return demonstration of topics presented.  Learning Barriers/Preferences:   Education Topics: Hypertension, Hypertension Reduction -Define heart disease and high blood pressure. Discus how high blood pressure affects the body and ways to reduce high blood pressure.   Exercise and Your Heart -Discuss why it is important to exercise, the FITT principles of exercise, normal and abnormal responses to exercise, and how to exercise safely.   Angina -Discuss  definition of angina, causes of angina, treatment of angina, and how to decrease risk of having angina.   Cardiac Medications -Review what the following cardiac medications are used for, how they affect the body, and side effects that may occur when taking the medications.  Medications include Aspirin, Beta blockers, calcium channel blockers, ACE Inhibitors, angiotensin receptor blockers, diuretics, digoxin, and antihyperlipidemics.   Congestive Heart Failure -Discuss the definition of CHF, how to live with CHF, the signs and symptoms of CHF, and how keep track of weight and sodium intake.   Heart Disease and Intimacy -Discus the effect sexual activity has on the heart, how changes occur during intimacy as we age, and safety during sexual activity.   Smoking Cessation / COPD -Discuss different methods to quit smoking, the health benefits of quitting  smoking, and the definition of COPD.   Nutrition I: Fats -Discuss the types of cholesterol, what cholesterol does to the heart, and how cholesterol levels can be controlled.   Nutrition II: Labels -Discuss the different components of food labels and how to read food label   Heart Parts/Heart Disease and PAD -Discuss the anatomy of the heart, the pathway of blood circulation through the heart, and these are affected by heart disease.   Stress I: Signs and Symptoms -Discuss the causes of stress, how stress may lead to anxiety and depression, and ways to limit stress.   Stress II: Relaxation -Discuss different types of relaxation techniques to limit stress.   Warning Signs of Stroke / TIA -Discuss definition of a stroke, what the signs and symptoms are of a stroke, and how to identify when someone is having stroke.   Knowledge Questionnaire Score:  Knowledge Questionnaire Score - 08/17/23 1305       Knowledge Questionnaire Score   Pre Score 18/24   Patient's caregiver completed the test.            Core Components/Risk Factors/Patient Goals at Admission:  Personal Goals and Risk Factors at Admission - 08/17/23 1306       Core Components/Risk Factors/Patient Goals on Admission    Weight Management Weight Maintenance;Yes    Intervention Weight Management: Develop a combined nutrition and exercise program designed to reach desired caloric intake, while maintaining appropriate intake of nutrient and fiber, sodium and fats, and appropriate energy expenditure required for the weight goal.;Weight Management: Provide education and appropriate resources to help participant work on and attain dietary goals.    Admit Weight 136 lb 9.6 oz (62 kg)    Goal Weight: Short Term 136 lb (61.7 kg)    Goal Weight: Long Term 136 lb (61.7 kg)    Expected Outcomes Weight Maintenance: Understanding of the daily nutrition guidelines, which includes 25-35% calories from fat, 7% or less cal from  saturated fats, less than 200mg  cholesterol, less than 1.5gm of sodium, & 5 or more servings of fruits and vegetables daily;Long Term: Adherence to nutrition and physical activity/exercise program aimed toward attainment of established weight goal;Short Term: Continue to assess and modify interventions until short term weight is achieved    Improve shortness of breath with ADL's Yes    Intervention Provide education, individualized exercise plan and daily activity instruction to help decrease symptoms of SOB with activities of daily living.    Expected Outcomes Short Term: Improve cardiorespiratory fitness to achieve a reduction of symptoms when performing ADLs;Long Term: Be able to perform more ADLs without symptoms or delay the onset of symptoms    Heart Failure Yes  Intervention Provide a combined exercise and nutrition program that is supplemented with education, support and counseling about heart failure. Directed toward relieving symptoms such as shortness of breath, decreased exercise tolerance, and extremity edema.    Expected Outcomes Improve functional capacity of life;Short term: Attendance in program 2-3 days a week with increased exercise capacity. Reported lower sodium intake. Reported increased fruit and vegetable intake. Reports medication compliance.;Short term: Daily weights obtained and reported for increase. Utilizing diuretic protocols set by physician.;Long term: Adoption of self-care skills and reduction of barriers for early signs and symptoms recognition and intervention leading to self-care maintenance.    Hypertension Yes    Intervention Provide education on lifestyle modifcations including regular physical activity/exercise, weight management, moderate sodium restriction and increased consumption of fresh fruit, vegetables, and low fat dairy, alcohol moderation, and smoking cessation.;Monitor prescription use compliance.    Expected Outcomes Short Term: Continued assessment and  intervention until BP is < 140/75mm HG in hypertensive participants. < 130/2mm HG in hypertensive participants with diabetes, heart failure or chronic kidney disease.;Long Term: Maintenance of blood pressure at goal levels.             Core Components/Risk Factors/Patient Goals Review:    Core Components/Risk Factors/Patient Goals at Discharge (Final Review):    ITP Comments:   Comments: Patient arrived for 1st visit/orientation/education at 1030. Patient was referred to CR by Dr. [name] due to Chronic systolic CHF. During orientation advised patient on arrival and appointment times what to wear, what to do before, during and after exercise. Reviewed attendance and class policy.  Pt is scheduled to return Cardiac Rehab on 08/24/23 at 1430. Pt was advised to come to class 15 minutes before class starts.  Discussed RPE/Dpysnea scales. Patient participated in warm up stretches. Patient was able to complete 6 minute walk test.  Telemetry:PAF. Patient was measured for the equipment. Discussed equipment safety with patient. Took patient pre-anthropometric measurements. Patient finished visit at 1200.

## 2023-08-20 ENCOUNTER — Other Ambulatory Visit: Payer: Self-pay | Admitting: Family Medicine

## 2023-08-24 ENCOUNTER — Encounter (HOSPITAL_COMMUNITY)
Admission: RE | Admit: 2023-08-24 | Discharge: 2023-08-24 | Disposition: A | Discharge status: Home / Self Care | Payer: Medicare Other | Source: Ambulatory Visit | Attending: Cardiology | Admitting: Cardiology

## 2023-08-24 DIAGNOSIS — I5022 Chronic systolic (congestive) heart failure: Secondary | ICD-10-CM | POA: Insufficient documentation

## 2023-08-24 NOTE — Progress Notes (Signed)
Daily Session Note  Patient Details  Name: RAMESHA FRISQUE MRN: [id number] Date of Birth: 06-18-1946 Referring Provider:   Flowsheet Row CARDIAC REHAB PHASE II ORIENTATION from 08/17/2023 in [facility] C[facility]IAC REHABILITATION  Referring Provider [name] MD       Encounter Date: 08/24/2023  Check In:  Session Check In - 08/24/23 1440       Check-In   Supervising physician immediately available to respond to emergencies See telemetry face sheet for immediately available MD    Location [facility]    Staff Present Ross Ludwig, BS, Exercise Physiologist;Leydy Worthey, RN;Jessica Canyon, MA, RCEP, CCRP, Dow Adolph, RN, BSN    Virtual Visit No    Medication changes reported     No    Fall or balance concerns reported    No    Tobacco Cessation No Change    Warm-up and Cool-down Performed on first and last piece of equipment    Resistance Training Performed Yes    VAD Patient? No    PAD/SET Patient? No      Pain Assessment   Currently in Pain? No/denies    Pain Score 0-No pain    Multiple Pain Sites No             Capillary Blood Glucose: No results found. However, due to the size of the patient record, not all encounters were searched. Please check Results Review for a complete set of results.    Social History   Tobacco Use  Smoking Status Never  Smokeless Tobacco Never    Goals Met:  Independence with exercise equipment Exercise tolerated well No report of concerns or symptoms today Strength training completed today  Goals Unmet:  Not Applicable  Comments: Pt able to follow exercise prescription today without complaint.  Will continue to monitor for progression.

## 2023-08-24 NOTE — Progress Notes (Signed)
First full day of exercise!  Patient was oriented to gym and equipment including functions, settings, policies, and procedures.  Patient's individual exercise prescription and treatment plan were reviewed.  All starting workloads were established based on the results of the 6 minute walk test done at initial orientation visit.  The plan for exercise progression was also introduced and progression will be customized based on patient's performance and goals.  ?

## 2023-08-26 ENCOUNTER — Encounter (HOSPITAL_COMMUNITY): Payer: Self-pay | Admitting: *Deleted

## 2023-08-26 ENCOUNTER — Encounter (HOSPITAL_COMMUNITY)
Admission: RE | Admit: 2023-08-26 | Discharge: 2023-08-26 | Disposition: A | Discharge status: Home / Self Care | Payer: Medicare Other | Source: Ambulatory Visit | Attending: Cardiology | Admitting: Cardiology

## 2023-08-26 DIAGNOSIS — I5022 Chronic systolic (congestive) heart failure: Secondary | ICD-10-CM

## 2023-08-26 NOTE — Progress Notes (Signed)
Cardiac Individual Treatment Plan  Patient Details  Name: Tara [name] MRN: [id number] Date of Birth: 08-23-1946 Referring Provider:   Flowsheet Row CARDIAC REHAB PHASE II ORIENTATION from 08/17/2023 in [facility] CARDIAC REHABILITATION  Referring Provider [name] MD       Initial Encounter Date:  Flowsheet Row CARDIAC REHAB PHASE II ORIENTATION from 08/17/2023 in [facility] [place] CARDIAC REHABILITATION  Date 08/17/23       Visit Diagnosis: Chronic systolic CHF (congestive heart failure) (HCC)  Patient's Home Medications on Admission:  Current Outpatient Medications:    acetaminophen (TYLENOL) 500 MG tablet, Place 500-1,000 mg into feeding tube every 6 (six) hours as needed for mild pain., Disp: , Rfl:    amiodarone (PACERONE) 200 MG tablet, TAKE 1 TABLET VIA G-TUBE ONCE DAILY., Disp: 30 tablet, Rfl: 0   apixaban (ELIQUIS) 5 MG TABS tablet, Take 5 mg by mouth 2 (two) times daily., Disp: , Rfl:    azelastine (ASTELIN) 0.1 % nasal spray, Place 2 sprays into both nostrils 2 (two) times daily. Use in each nostril as directed, Disp: 30 mL, Rfl: 12   bisacodyl 5 MG EC tablet, One tablet every 3 days, if needed, for constipation, Disp: 30 tablet, Rfl: 1   clonazePAM (KLONOPIN) 0.5 MG tablet, TAKE (1) TABLET BY MOUTH AT BEDTIME., Disp: 30 tablet, Rfl: 0   docusate sodium (COLACE) 100 MG capsule, Take 1 capsule (100 mg total) by mouth 2 (two) times daily., Disp: , Rfl:    FARXIGA 10 MG TABS tablet, Take 1 tablet (10 mg total) by mouth daily before breakfast., Disp: 90 tablet, Rfl: 3   fluticasone (FLONASE) 50 MCG/ACT nasal spray, Place 2 sprays into both nostrils daily., Disp: 16 g, Rfl: 6   furosemide (LASIX) 20 MG tablet, Take 1 tablet (20 mg total) by mouth daily., Disp: 30 tablet, Rfl: 11   hydrocortisone (ANUSOL-HC) 2.5 % rectal cream, Place 1 Application rectally 2 (two) times daily., Disp: 30 g, Rfl: 3   hydrocortisone (ANUSOL-HC) 25 MG suppository, Place 1 suppository (25 mg  total) rectally 2 (two) times daily., Disp: 12 suppository, Rfl: 3   lacosamide (VIMPAT) 50 MG TABS tablet, Take 1 tablet (50 mg total) by mouth 2 (two) times daily., Disp: 180 tablet, Rfl: 2   levETIRAcetam (KEPPRA) 500 MG tablet, Take 1 tablet (500 mg total) by mouth 2 (two) times daily., Disp: 180 tablet, Rfl: 3   montelukast (SINGULAIR) 10 MG tablet, Take 1 tablet (10 mg total) by mouth at bedtime., Disp: 30 tablet, Rfl: 3   pantoprazole (PROTONIX) 40 MG tablet, TAKE (1) TABLET BY MOUTH TWICE DAILY., Disp: 60 tablet, Rfl: 0   polyethylene glycol (MIRALAX) 17 g packet, Take one packet mira lax once daily in 8 ounces water, Disp: 28 each, Rfl: 5   potassium chloride SA (KLOR-CON M) 20 MEQ tablet, Take 1 tablet (20 mEq total) by mouth daily., Disp: 30 tablet, Rfl: 11   sertraline (ZOLOFT) 25 MG tablet, TAKE 1 TABLET BY MOUTH ONCE A DAY., Disp: 30 tablet, Rfl: 0  Past Medical History: 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   Anxiety    Breast cancer (HCC) 2008   right - s/p lumpectomy->chemo, radiation   Depression    Dysrhythmia    hx SVT   Family history of colon cancer    Family history of prostate cancer    GERD (gastroesophageal reflux disease)    H/O: hysterectomy  Hiatal hernia    History of cancer chemotherapy    History of radiation therapy    Hyperlipidemia    Hypertension 01/25/2018   Nonischemic cardiomyopathy (HCC)    Personal history of radiation therapy 01/05/209   SVT (supraventricular tachycardia) (HCC)    short RP SVT documented 5/14   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HCC)    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)   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    Tobacco Use: Social History   Tobacco Use  Smoking Status Never  Smokeless Tobacco Never    Labs: Review Flowsheet  More data exists      Latest Ref Rng & Units 03/24/2022  04/18/2022 12/31/2022 06/03/2023 06/24/2023  Labs for ITP Cardiac and Pulmonary Rehab  Cholestrol 100 - 199 mg/dL - 73  244  - -  LDL (calc) 0 - 99 mg/dL - 38  94  - -  HDL-C >01 mg/dL - 18  69  - -  Trlycerides 0 - 149 mg/dL - 85  81  - -  Hemoglobin A1c 4.8 - 5.6 % - 5.9  6.1  6.2  -  PH, Arterial 7.35 - 7.45 7.359  7.381  - - - -  PCO2 arterial 32 - 48 mmHg 40.6  39.2  - - - -  Bicarbonate 20.0 - 28.0 mmol/L 23.4  23.8  - - - -  TCO2 22 - 32 mmol/L 25  25  - - - 18   Acid-base deficit 0.0 - 2.0 mmol/L 3.0  2.0  - - - -  O2 Saturation % 100  100  - - - -    Details       Multiple values from one day are sorted in reverse-chronological order         Capillary Blood Glucose: Lab Results  Component Value Date   GLUCAP 86 06/22/2023   GLUCAP 91 06/21/2023   GLUCAP 78 09/22/2022   GLUCAP 91 09/22/2022   GLUCAP 114 (H) 09/22/2022     Exercise Target Goals: Exercise Program Goal: Individual exercise prescription set using results from initial 6 min walk test and THRR while considering  patient's activity barriers and safety.   Exercise Prescription Goal: Starting with aerobic activity 30 plus minutes a day, 3 days per week for initial exercise prescription. Provide home exercise prescription and guidelines that participant acknowledges understanding prior to discharge.  Activity Barriers & Risk Stratification:  Activity Barriers & Cardiac Risk Stratification - 08/17/23 1119       Activity Barriers & Cardiac Risk Stratification   Activity Barriers Left Knee Replacement;Deconditioning;Shortness of Breath;Balance Concerns;Muscular Weakness    Cardiac Risk Stratification Moderate             6 Minute Walk:  6 Minute Walk     Row Name 08/17/23 1332         6 Minute Walk   Phase Initial  NuStep Test     Distance 193 feet  steps (distane 0.11 miles)     Walk Time 6 minutes     # of Rest Breaks 0     MPH 32  steps per minutes     METS 1.3     RPE 11     VO2 Peak  4.55     Symptoms Yes (comment)     Comments fatigued at end     Resting HR 55 bpm  Resting BP 80/50     Resting Oxygen Saturation  91 %     Exercise Oxygen Saturation  during 6 min walk 96 %     Max Ex. HR 102 bpm     Max Ex. BP 102/62     2 Minute Post BP 94/62              Oxygen Initial Assessment:   Oxygen Re-Evaluation:   Oxygen Discharge (Final Oxygen Re-Evaluation):   Initial Exercise Prescription:  Initial Exercise Prescription - 08/17/23 1300       Date of Initial Exercise RX and Referring Provider   Date 08/17/23    Referring Provider [name] MD      Oxygen   Maintain Oxygen Saturation 88% or higher      NuStep   Level 1    SPM 60    Minutes 30    METs 1.3      Prescription Details   Frequency (times per week) 2    Duration Progress to 30 minutes of continuous aerobic without signs/symptoms of physical distress      Intensity   THRR 40-80% of Max Heartrate 90-128    Ratings of Perceived Exertion 11-13    Perceived Dyspnea 0-4      Progression   Progression Continue to progress workloads to maintain intensity without signs/symptoms of physical distress.      Resistance Training   Training Prescription Yes    Weight 2 lb    Reps 10-15             Perform Capillary Blood Glucose checks as needed.  Exercise Prescription Changes:   Exercise Prescription Changes     Row Name 08/17/23 1300 08/24/23 1200           Response to Exercise   Blood Pressure (Admit) 80/50 108/60      Blood Pressure (Exercise) 102/62 114/62      Blood Pressure (Exit) 94/62 112/64      Heart Rate (Admit) 55 bpm 79 bpm      Heart Rate (Exercise) 102 bpm 83 bpm      Heart Rate (Exit) 53 bpm 67 bpm      Oxygen Saturation (Admit) 91 % --      Oxygen Saturation (Exercise) 96 % --      Rating of Perceived Exertion (Exercise) 11 14      Symptoms fatigued at end --      Comments NuStep test results --      Duration -- Continue with 30 min of aerobic  exercise without signs/symptoms of physical distress.      Intensity -- THRR unchanged        Progression   Progression -- Continue to progress workloads to maintain intensity without signs/symptoms of physical distress.        Resistance Training   Training Prescription -- Yes      Weight -- 2lbs      Reps -- 10-15        NuStep   Level -- 1      SPM -- 37      Minutes -- 30      METs -- 1.4        Oxygen   Maintain Oxygen Saturation -- 88% or higher               Exercise Comments:   Exercise Comments     Row Name 08/24/23 1452  Exercise Comments First full day of exercise!  Patient was oriented to gym and equipment including functions, settings, policies, and procedures.  Patient's individual exercise prescription and treatment plan were reviewed.  All starting workloads were established based on the results of the 6 minute walk test done at initial orientation visit.  The plan for exercise progression was also introduced and progression will be customized based on patient's performance and goals.                Exercise Goals and Review:   Exercise Goals     Row Name 08/17/23 1335             Exercise Goals   Increase Physical Activity Yes       Intervention Provide advice, education, support and counseling about physical activity/exercise needs.;Develop an individualized exercise prescription for aerobic and resistive training based on initial evaluation findings, risk stratification, comorbidities and participant's personal goals.       Expected Outcomes Short Term: Attend rehab on a regular basis to increase amount of physical activity.;Long Term: Add in home exercise to make exercise part of routine and to increase amount of physical activity.;Long Term: Exercising regularly at least 3-5 days a week.       Increase Strength and Stamina Yes       Intervention Provide advice, education, support and counseling about physical activity/exercise  needs.;Develop an individualized exercise prescription for aerobic and resistive training based on initial evaluation findings, risk stratification, comorbidities and participant's personal goals.       Expected Outcomes Short Term: Increase workloads from initial exercise prescription for resistance, speed, and METs.;Short Term: Perform resistance training exercises routinely during rehab and add in resistance training at home;Long Term: Improve cardiorespiratory fitness, muscular endurance and strength as measured by increased METs and functional capacity ( )       Able to understand and use rate of perceived exertion (RPE) scale Yes       Intervention Provide education and explanation on how to use RPE scale       Expected Outcomes Short Term: Able to use RPE daily in rehab to express subjective intensity level;Long Term:  Able to use RPE to guide intensity level when exercising independently       Able to understand and use Dyspnea scale Yes       Intervention Provide education and explanation on how to use Dyspnea scale       Expected Outcomes Short Term: Able to use Dyspnea scale daily in rehab to express subjective sense of shortness of breath during exertion;Long Term: Able to use Dyspnea scale to guide intensity level when exercising independently       Knowledge and understanding of Target Heart Rate Range (THRR) Yes       Intervention Provide education and explanation of THRR including how the numbers were predicted and where they are located for reference       Expected Outcomes Short Term: Able to state/look up THRR;Long Term: Able to use THRR to govern intensity when exercising independently;Short Term: Able to use daily as guideline for intensity in rehab       Able to check pulse independently Yes       Intervention Provide education and demonstration on how to check pulse in carotid and radial arteries.;Review the importance of being able to check your own pulse for safety during  independent exercise       Expected Outcomes Short Term: Able to explain why pulse checking is  important during independent exercise;Long Term: Able to check pulse independently and accurately       Understanding of Exercise Prescription Yes       Intervention Provide education, explanation, and written materials on patient's individual exercise prescription       Expected Outcomes Short Term: Able to explain program exercise prescription;Long Term: Able to explain home exercise prescription to exercise independently                Exercise Goals Re-Evaluation :  Exercise Goals Re-Evaluation     Row Name 08/24/23 1452             Exercise Goal Re-Evaluation   Exercise Goals Review Able to understand and use rate of perceived exertion (RPE) scale;Knowledge and understanding of Target Heart Rate Range (THRR);Able to understand and use Dyspnea scale       Comments Reviewed RPE and dyspnea scale, THR and program prescription with pt today.  Pt voiced understanding and was given a copy of goals to take home.       Expected Outcomes Short: Use RPE daily to regulate intensity.  Long: Follow program prescription in THR.                 Discharge Exercise Prescription (Final Exercise Prescription Changes):  Exercise Prescription Changes - 08/24/23 1200       Response to Exercise   Blood Pressure (Admit) 108/60    Blood Pressure (Exercise) 114/62    Blood Pressure (Exit) 112/64    Heart Rate (Admit) 79 bpm    Heart Rate (Exercise) 83 bpm    Heart Rate (Exit) 67 bpm    Rating of Perceived Exertion (Exercise) 14    Duration Continue with 30 min of aerobic exercise without signs/symptoms of physical distress.    Intensity THRR unchanged      Progression   Progression Continue to progress workloads to maintain intensity without signs/symptoms of physical distress.      Resistance Training   Training Prescription Yes    Weight 2lbs    Reps 10-15      NuStep   Level 1    SPM  37    Minutes 30    METs 1.4      Oxygen   Maintain Oxygen Saturation 88% or higher             Nutrition:  Target Goals: Understanding of nutrition guidelines, daily intake of sodium 1500mg , cholesterol 200mg , calories 30% from fat and 7% or less from saturated fats, daily to have 5 or more servings of fruits and vegetables.  Biometrics:  Pre Biometrics - 08/17/23 1336       Pre Biometrics   Height 4\' 9"  (1.448 m)    Weight 136 lb 9.6 oz (62 kg)    Waist Circumference 37 inches    Hip Circumference 49 inches    Waist to Hip Ratio 0.76 %    BMI (Calculated) 29.55    Grip Strength 20.3 kg              Nutrition Therapy Plan and Nutrition Goals:   Nutrition Assessments:  MEDIFICTS Score Key: >=70 Need to make dietary changes  40-70 Heart Healthy Diet <= 40 Therapeutic Level Cholesterol Diet  Flowsheet Row CARDIAC REHAB PHASE II ORIENTATION from 08/17/2023 in [facility] CARDIAC REHABILITATION  Picture Your Plate Total Score on Admission 23      Picture Your Plate Scores: <81 Unhealthy dietary pattern with much room for improvement. 41-50  Dietary pattern unlikely to meet recommendations for good health and room for improvement. 51-60 More healthful dietary pattern, with some room for improvement.  >60 Healthy dietary pattern, although there may be some specific behaviors that could be improved.    Nutrition Goals Re-Evaluation:   Nutrition Goals Discharge (Final Nutrition Goals Re-Evaluation):   Psychosocial: Target Goals: Acknowledge presence or absence of significant depression and/or stress, maximize coping skills, provide positive support system. Participant is able to verbalize types and ability to use techniques and skills needed for reducing stress and depression.  Initial Review & Psychosocial Screening:  Initial Psych Review & Screening - 08/17/23 1308       Initial Review   Current issues with None Identified      Family Dynamics    Good Support System? Yes      Barriers   Psychosocial barriers to participate in program The patient should benefit from training in stress management and relaxation.;There are no identifiable barriers or psychosocial needs.      Screening Interventions   Interventions Encouraged to exercise;To provide support and resources with identified psychosocial needs;Provide feedback about the scores to participant    Expected Outcomes Short Term goal: Utilizing psychosocial counselor, staff and physician to assist with identification of specific Stressors or current issues interfering with healing process. Setting desired goal for each stressor or current issue identified.;Long Term Goal: Stressors or current issues are controlled or eliminated.;Short Term goal: Identification and review with participant of any Quality of Life or Depression concerns found by scoring the questionnaire.;Long Term goal: The participant improves quality of Life and PHQ9 Scores as seen by post scores and/or verbalization of changes             Quality of Life Scores:  Quality of Life - 08/17/23 1336       Quality of Life   Select Quality of Life      Quality of Life Scores   Health/Function Pre 24.4 %    Socioeconomic Pre 29.25 %    Psych/Spiritual Pre 30 %    Family Pre 28.8 %    GLOBAL Pre 27.26 %            Scores of 19 and below usually indicate a poorer quality of life in these areas.  A difference of  2-3 points is a clinically meaningful difference.  A difference of 2-3 points in the total score of the Quality of Life Index has been associated with significant improvement in overall quality of life, self-image, physical symptoms, and general health in studies assessing change in quality of life.  PHQ-9: Review Flowsheet  More data exists      08/17/2023 08/11/2023 08/05/2023 07/22/2023 07/01/2023  Depression screen PHQ 2/9  Decreased Interest 1 0 0 0 0 0  Down, Depressed, Hopeless 0 0 0 0 0 0   PHQ - 2 Score 1 0 0 0 0 0  Altered sleeping 0 - - - -  Tired, decreased energy 1 - - - -  Change in appetite 1 - - - -  Feeling bad or failure about yourself  0 - - - -  Trouble concentrating 0 - - - -  Moving slowly or fidgety/restless 0 - - - -  Suicidal thoughts 0 - - - -  PHQ-9 Score 3 - - - -  Difficult doing work/chores Somewhat difficult - - - -    Details       Multiple values from one day are sorted in  reverse-chronological order        Interpretation of Total Score  Total Score Depression Severity:  1-4 = Minimal depression, 5-9 = Mild depression, 10-14 = Moderate depression, 15-19 = Moderately severe depression, 20-27 = Severe depression   Psychosocial Evaluation and Intervention:  Psychosocial Evaluation - 08/17/23 1308       Psychosocial Evaluation & Interventions   Interventions Stress management education;Relaxation education;Encouraged to exercise with the program and follow exercise prescription    Comments Patient was referred to CR with chronic systolic HF. She was accompained today by her husband and paid caregiver. Her caregiver completed all of her documentation and she and patient's husband answered most questions. Patient was able to sign for herself. They denied any depression or anxiety or major stressors. She is taking Zoloft and clonezapam a bedtime. They said she had been on these medications a long time. She says she sleeps well. She is a retired [employer] fundraiser. She was in a wheelchair today with a gait belt. Her husband says she is able to transfer herself at home using a walker. She did need assistance today transfering from the wheelchair to the nustep. Her goals for the program are to improve her strength and stamina and improve her mobility. 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 Her knee pain and impaired mobility could be a barrier for completeing the program.    Expected Outcomes Short Term: start the program and attend  consistently. Long Term: meet her persoanl goals.    Continue Psychosocial Services  Follow up required by staff             Psychosocial Re-Evaluation:   Psychosocial Discharge (Final Psychosocial Re-Evaluation):   Vocational Rehabilitation: Provide vocational rehab assistance to qualifying candidates.   Vocational Rehab Evaluation & Intervention:  Vocational Rehab - 08/17/23 1305       Initial Vocational Rehab Evaluation & Intervention   Assessment shows need for Vocational Rehabilitation No      Vocational Rehab Re-Evaulation   Comments Retired.             Education: Education Goals: Education classes will be provided on a weekly basis, covering required topics. Participant will state understanding/return demonstration of topics presented.  Learning Barriers/Preferences:   Education Topics: Hypertension, Hypertension Reduction -Define heart disease and high blood pressure. Discus how high blood pressure affects the body and ways to reduce high blood pressure.   Exercise and Your Heart -Discuss why it is important to exercise, the FITT principles of exercise, normal and abnormal responses to exercise, and how to exercise safely.   Angina -Discuss definition of angina, causes of angina, treatment of angina, and how to decrease risk of having angina.   Cardiac Medications -Review what the following cardiac medications are used for, how they affect the body, and side effects that may occur when taking the medications.  Medications include Aspirin, Beta blockers, calcium channel blockers, ACE Inhibitors, angiotensin receptor blockers, diuretics, digoxin, and antihyperlipidemics.   Congestive Heart Failure -Discuss the definition of CHF, how to live with CHF, the signs and symptoms of CHF, and how keep track of weight and sodium intake.   Heart Disease and Intimacy -Discus the effect sexual activity has on the heart, how changes occur during intimacy as we  age, and safety during sexual activity.   Smoking Cessation / COPD -Discuss different methods to quit smoking, the health benefits of quitting smoking, and the definition of COPD.   Nutrition I: Fats -  Discuss the types of cholesterol, what cholesterol does to the heart, and how cholesterol levels can be controlled.   Nutrition II: Labels -Discuss the different components of food labels and how to read food label   Heart Parts/Heart Disease and PAD -Discuss the anatomy of the heart, the pathway of blood circulation through the heart, and these are affected by heart disease.   Stress I: Signs and Symptoms -Discuss the causes of stress, how stress may lead to anxiety and depression, and ways to limit stress.   Stress II: Relaxation -Discuss different types of relaxation techniques to limit stress.   Warning Signs of Stroke / TIA -Discuss definition of a stroke, what the signs and symptoms are of a stroke, and how to identify when someone is having stroke.   Knowledge Questionnaire Score:  Knowledge Questionnaire Score - 08/17/23 1305       Knowledge Questionnaire Score   Pre Score 18/24   Patient's caregiver completed the test.            Core Components/Risk Factors/Patient Goals at Admission:  Personal Goals and Risk Factors at Admission - 08/17/23 1306       Core Components/Risk Factors/Patient Goals on Admission    Weight Management Weight Maintenance;Yes    Intervention Weight Management: Develop a combined nutrition and exercise program designed to reach desired caloric intake, while maintaining appropriate intake of nutrient and fiber, sodium and fats, and appropriate energy expenditure required for the weight goal.;Weight Management: Provide education and appropriate resources to help participant work on and attain dietary goals.    Admit Weight 136 lb 9.6 oz (62 kg)    Goal Weight: Short Term 136 lb (61.7 kg)    Goal Weight: Long Term 136 lb (61.7 kg)     Expected Outcomes Weight Maintenance: Understanding of the daily nutrition guidelines, which includes 25-35% calories from fat, 7% or less cal from saturated fats, less than 200mg  cholesterol, less than 1.5gm of sodium, & 5 or more servings of fruits and vegetables daily;Long Term: Adherence to nutrition and physical activity/exercise program aimed toward attainment of established weight goal;Short Term: Continue to assess and modify interventions until short term weight is achieved    Improve shortness of breath with ADL's Yes    Intervention Provide education, individualized exercise plan and daily activity instruction to help decrease symptoms of SOB with activities of daily living.    Expected Outcomes Short Term: Improve cardiorespiratory fitness to achieve a reduction of symptoms when performing ADLs;Long Term: Be able to perform more ADLs without symptoms or delay the onset of symptoms    Heart Failure Yes    Intervention Provide a combined exercise and nutrition program that is supplemented with education, support and counseling about heart failure. Directed toward relieving symptoms such as shortness of breath, decreased exercise tolerance, and extremity edema.    Expected Outcomes Improve functional capacity of life;Short term: Attendance in program 2-3 days a week with increased exercise capacity. Reported lower sodium intake. Reported increased fruit and vegetable intake. Reports medication compliance.;Short term: Daily weights obtained and reported for increase. Utilizing diuretic protocols set by physician.;Long term: Adoption of self-care skills and reduction of barriers for early signs and symptoms recognition and intervention leading to self-care maintenance.    Hypertension Yes    Intervention Provide education on lifestyle modifcations including regular physical activity/exercise, weight management, moderate sodium restriction and increased consumption of fresh fruit, vegetables, and low  fat dairy, alcohol moderation, and smoking cessation.;Monitor prescription use compliance.  Expected Outcomes Short Term: Continued assessment and intervention until BP is < 140/16mm HG in hypertensive participants. < 130/11mm HG in hypertensive participants with diabetes, heart failure or chronic kidney disease.;Long Term: Maintenance of blood pressure at goal levels.             Core Components/Risk Factors/Patient Goals Review:    Core Components/Risk Factors/Patient Goals at Discharge (Final Review):    ITP Comments:  ITP Comments     Row Name 08/24/23 1452 08/26/23 1012         ITP Comments First full day of exercise!  Patient was oriented to gym and equipment including functions, settings, policies, and procedures.  Patient's individual exercise prescription and treatment plan were reviewed.  All starting workloads were established based on the results of the 6 minute walk test done at initial orientation visit.  The plan for exercise progression was also introduced and progression will be customized based on patient's performance and goals. 30 day review completed. ITP sent to Dr. Dina Rich, Medical Director of Cardiac Rehab. Continue with ITP unless changes are made by physician.   New to program.               Comments: 30 day review

## 2023-08-26 NOTE — Progress Notes (Signed)
Daily Session Note  Patient Details  Name: Tara [name] MRN: [id number] Date of Birth: 1946/01/03 Referring Provider:   Flowsheet Row CARDIAC REHAB PHASE II ORIENTATION from 08/17/2023 in [facility] C[facility]  Referring Provider [name] MD       Encounter Date: 08/26/2023  Check In:  Session Check In - 08/26/23 1422       Check-In   Supervising physician immediately available to respond to emergencies See telemetry face sheet for immediately available MD    Location [facility]    Staff Present Ross Ludwig, BS, Exercise Physiologist;Jessica Juanetta Gosling, MA, RCEP, CCRP, CCET    Virtual Visit No    Medication changes reported     No    Fall or balance concerns reported    No    Tobacco Cessation No Change    Warm-up and Cool-down Performed on first and last piece of equipment    Resistance Training Performed Yes    VAD Patient? No    PAD/SET Patient? No      Pain Assessment   Currently in Pain? No/denies    Pain Score 0-No pain    Multiple Pain Sites No             Capillary Blood Glucose: No results found. However, due to the size of the patient record, not all encounters were searched. Please check Results Review for a complete set of results.    Social History   Tobacco Use  Smoking Status Never  Smokeless Tobacco Never    Goals Met:  Independence with exercise equipment Exercise tolerated well No report of concerns or symptoms today Strength training completed today  Goals Unmet:  Not Applicable  Comments: Pt able to follow exercise prescription today without complaint.  Will continue to monitor for progression.

## 2023-08-31 ENCOUNTER — Other Ambulatory Visit: Payer: Self-pay | Admitting: Family Medicine

## 2023-08-31 ENCOUNTER — Encounter (HOSPITAL_COMMUNITY): Payer: Medicare Other

## 2023-09-01 ENCOUNTER — Other Ambulatory Visit: Payer: Self-pay | Admitting: Family Medicine

## 2023-09-01 MED ORDER — CLONAZEPAM 0.5 MG PO TABS: 0.5000 mg | ORAL_TABLET | Freq: Every day | ORAL | 5 refills | Status: DC

## 2023-09-02 ENCOUNTER — Encounter (HOSPITAL_COMMUNITY): Payer: Medicare Other

## 2023-09-02 ENCOUNTER — Encounter (HOSPITAL_COMMUNITY): Payer: Self-pay | Admitting: *Deleted

## 2023-09-02 DIAGNOSIS — I5022 Chronic systolic (congestive) heart failure: Secondary | ICD-10-CM

## 2023-09-02 NOTE — Progress Notes (Signed)
Cardiac Individual Treatment Plan  Patient Details  Name: Tara [name] MRN: [id number] Date of Birth: [birth year] Referring Provider:   Flowsheet Row CARDIAC REHAB PHASE II ORIENTATION from 08/17/2023 in [facility] CARDIAC REHABILITATION  Referring Provider [name] MD       Initial Encounter Date:  Flowsheet Row CARDIAC REHAB PHASE II ORIENTATION from 08/17/2023 in [facility] CARDIAC REHABILITATION  Date 08/17/23       Visit Diagnosis: Chronic systolic CHF (congestive heart failure) (HCC)  Patient's Home Medications on Admission:  Current Outpatient Medications:    acetaminophen (TYLENOL) 500 MG tablet, Place 500-1,000 mg into feeding tube every 6 (six) hours as needed for mild pain., Disp: , Rfl:    amiodarone (PACERONE) 200 MG tablet, TAKE 1 TABLET VIA G-TUBE ONCE DAILY., Disp: 30 tablet, Rfl: 0   azelastine (ASTELIN) 0.1 % nasal spray, Place 2 sprays into both nostrils 2 (two) times daily. Use in each nostril as directed, Disp: 30 mL, Rfl: 12   bisacodyl 5 MG EC tablet, One tablet every 3 days, if needed, for constipation, Disp: 30 tablet, Rfl: 1   clonazePAM (KLONOPIN) 0.5 MG tablet, TAKE (1) TABLET BY MOUTH AT BEDTIME., Disp: 30 tablet, Rfl: 0   clonazePAM (KLONOPIN) 0.5 MG tablet, Take 1 tablet (0.5 mg total) by mouth at bedtime., Disp: 30 tablet, Rfl: 5   docusate sodium (COLACE) 100 MG capsule, Take 1 capsule (100 mg total) by mouth 2 (two) times daily., Disp: , Rfl:    ELIQUIS 5 MG TABS tablet, TAKE 1 TABLET VIA G-TUBE 2 TIMES DAILY., Disp: 60 tablet, Rfl: 0   FARXIGA 10 MG TABS tablet, Take 1 tablet (10 mg total) by mouth daily before breakfast., Disp: 90 tablet, Rfl: 3   fluticasone (FLONASE) 50 MCG/ACT nasal spray, Place 2 sprays into both nostrils daily., Disp: 16 g, Rfl: 6   furosemide (LASIX) 20 MG tablet, Take 1 tablet (20 mg total) by mouth daily., Disp: 30 tablet, Rfl: 11   hydrocortisone (ANUSOL-HC) 2.5 % rectal cream, Place 1 Application rectally 2 (two)  times daily., Disp: 30 g, Rfl: 3   hydrocortisone (ANUSOL-HC) 25 MG suppository, Place 1 suppository (25 mg total) rectally 2 (two) times daily., Disp: 12 suppository, Rfl: 3   lacosamide (VIMPAT) 50 MG TABS tablet, Take 1 tablet (50 mg total) by mouth 2 (two) times daily., Disp: 180 tablet, Rfl: 2   levETIRAcetam (KEPPRA) 500 MG tablet, Take 1 tablet (500 mg total) by mouth 2 (two) times daily., Disp: 180 tablet, Rfl: 3   montelukast (SINGULAIR) 10 MG tablet, Take 1 tablet (10 mg total) by mouth at bedtime., Disp: 30 tablet, Rfl: 3   pantoprazole (PROTONIX) 40 MG tablet, TAKE (1) TABLET BY MOUTH TWICE DAILY., Disp: 60 tablet, Rfl: 0   polyethylene glycol (MIRALAX) 17 g packet, Take one packet mira lax once daily in 8 ounces water, Disp: 28 each, Rfl: 5   potassium chloride SA (KLOR-CON M) 20 MEQ tablet, Take 1 tablet (20 mEq total) by mouth daily., Disp: 30 tablet, Rfl: 11   sertraline (ZOLOFT) 25 MG tablet, TAKE 1 TABLET BY MOUTH ONCE A DAY., Disp: 30 tablet, Rfl: 0  Past Medical History: 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   Anxiety    Breast cancer (HCC) 2008   right - s/p lumpectomy->chemo, radiation   Depression    Dysrhythmia    hx SVT   Family history of colon  cancer    Family history of prostate cancer    GERD (gastroesophageal reflux disease)    H/O: hysterectomy    Hiatal hernia    History of cancer chemotherapy    History of radiation therapy    Hyperlipidemia    Hypertension 01/25/2018   Nonischemic cardiomyopathy (HCC)    Personal history of radiation therapy 01/05/209   SVT (supraventricular tachycardia) (HCC)    short RP SVT documented 5/14   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HCC)    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)   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    Tobacco Use: Social History   Tobacco Use  Smoking Status Never   Smokeless Tobacco Never    Labs: Review Flowsheet  More data exists      Latest Ref Rng & Units 03/24/2022 04/18/2022 12/31/2022 06/03/2023 06/24/2023  Labs for ITP Cardiac and Pulmonary Rehab  Cholestrol 100 - 199 mg/dL - 73  161  - -  LDL (calc) 0 - 99 mg/dL - 38  94  - -  HDL-C >09 mg/dL - 18  69  - -  Trlycerides 0 - 149 mg/dL - 85  81  - -  Hemoglobin A1c 4.8 - 5.6 % - 5.9  6.1  6.2  -  PH, Arterial 7.35 - 7.45 7.359  7.381  - - - -  PCO2 arterial 32 - 48 mmHg 40.6  39.2  - - - -  Bicarbonate 20.0 - 28.0 mmol/L 23.4  23.8  - - - -  TCO2 22 - 32 mmol/L 25  25  - - - 18   Acid-base deficit 0.0 - 2.0 mmol/L 3.0  2.0  - - - -  O2 Saturation % 100  100  - - - -    Details       Multiple values from one day are sorted in reverse-chronological order         Capillary Blood Glucose: Lab Results  Component Value Date   GLUCAP 86 06/22/2023   GLUCAP 91 06/21/2023   GLUCAP 78 09/22/2022   GLUCAP 91 09/22/2022   GLUCAP 114 (H) 09/22/2022     Exercise Target Goals: Exercise Program Goal: Individual exercise prescription set using results from initial 6 min walk test and THRR while considering  patient's activity barriers and safety.   Exercise Prescription Goal: Starting with aerobic activity 30 plus minutes a day, 3 days per week for initial exercise prescription. Provide home exercise prescription and guidelines that participant acknowledges understanding prior to discharge.  Activity Barriers & Risk Stratification:  Activity Barriers & Cardiac Risk Stratification - 08/17/23 1119       Activity Barriers & Cardiac Risk Stratification   Activity Barriers Left Knee Replacement;Deconditioning;Shortness of Breath;Balance Concerns;Muscular Weakness    Cardiac Risk Stratification Moderate             6 Minute Walk:  6 Minute Walk     Row Name 08/17/23 1332         6 Minute Walk   Phase Initial  NuStep Test     Distance 193 feet  steps (distane 0.11 miles)      Walk Time 6 minutes     # of Rest Breaks 0     MPH 32  steps per minutes     METS 1.3     RPE 11     VO2 Peak 4.55  Symptoms Yes (comment)     Comments fatigued at end     Resting HR 55 bpm     Resting BP 80/50     Resting Oxygen Saturation  91 %     Exercise Oxygen Saturation  during 6 min walk 96 %     Max Ex. HR 102 bpm     Max Ex. BP 102/62     2 Minute Post BP 94/62              Oxygen Initial Assessment:   Oxygen Re-Evaluation:   Oxygen Discharge (Final Oxygen Re-Evaluation):   Initial Exercise Prescription:  Initial Exercise Prescription - 08/17/23 1300       Date of Initial Exercise RX and Referring Provider   Date 08/17/23    Referring Provider [name] MD      Oxygen   Maintain Oxygen Saturation 88% or higher      NuStep   Level 1    SPM 60    Minutes 30    METs 1.3      Prescription Details   Frequency (times per week) 2    Duration Progress to 30 minutes of continuous aerobic without signs/symptoms of physical distress      Intensity   THRR 40-80% of Max Heartrate 90-128    Ratings of Perceived Exertion 11-13    Perceived Dyspnea 0-4      Progression   Progression Continue to progress workloads to maintain intensity without signs/symptoms of physical distress.      Resistance Training   Training Prescription Yes    Weight 2 lb    Reps 10-15             Perform Capillary Blood Glucose checks as needed.  Exercise Prescription Changes:   Exercise Prescription Changes     Row Name 08/17/23 1300 08/24/23 1200           Response to Exercise   Blood Pressure (Admit) 80/50 108/60      Blood Pressure (Exercise) 102/62 114/62      Blood Pressure (Exit) 94/62 112/64      Heart Rate (Admit) 55 bpm 79 bpm      Heart Rate (Exercise) 102 bpm 83 bpm      Heart Rate (Exit) 53 bpm 67 bpm      Oxygen Saturation (Admit) 91 % --      Oxygen Saturation (Exercise) 96 % --      Rating of Perceived Exertion (Exercise) 11 14       Symptoms fatigued at end --      Comments NuStep test results --      Duration -- Continue with 30 min of aerobic exercise without signs/symptoms of physical distress.      Intensity -- THRR unchanged        Progression   Progression -- Continue to progress workloads to maintain intensity without signs/symptoms of physical distress.        Resistance Training   Training Prescription -- Yes      Weight -- 2lbs      Reps -- 10-15        NuStep   Level -- 1      SPM -- 37      Minutes -- 30      METs -- 1.4        Oxygen   Maintain Oxygen Saturation -- 88% or higher  Exercise Comments:   Exercise Comments     Row Name 08/24/23 1452           Exercise Comments First full day of exercise!  Patient was oriented to gym and equipment including functions, settings, policies, and procedures.  Patient's individual exercise prescription and treatment plan were reviewed.  All starting workloads were established based on the results of the 6 minute walk test done at initial orientation visit.  The plan for exercise progression was also introduced and progression will be customized based on patient's performance and goals.                Exercise Goals and Review:   Exercise Goals     Row Name 08/17/23 1335             Exercise Goals   Increase Physical Activity Yes       Intervention Provide advice, education, support and counseling about physical activity/exercise needs.;Develop an individualized exercise prescription for aerobic and resistive training based on initial evaluation findings, risk stratification, comorbidities and participant's personal goals.       Expected Outcomes Short Term: Attend rehab on a regular basis to increase amount of physical activity.;Long Term: Add in home exercise to make exercise part of routine and to increase amount of physical activity.;Long Term: Exercising regularly at least 3-5 days a week.       Increase Strength and  Stamina Yes       Intervention Provide advice, education, support and counseling about physical activity/exercise needs.;Develop an individualized exercise prescription for aerobic and resistive training based on initial evaluation findings, risk stratification, comorbidities and participant's personal goals.       Expected Outcomes Short Term: Increase workloads from initial exercise prescription for resistance, speed, and METs.;Short Term: Perform resistance training exercises routinely during rehab and add in resistance training at home;Long Term: Improve cardiorespiratory fitness, muscular endurance and strength as measured by increased METs and functional capacity ( )       Able to understand and use rate of perceived exertion (RPE) scale Yes       Intervention Provide education and explanation on how to use RPE scale       Expected Outcomes Short Term: Able to use RPE daily in rehab to express subjective intensity level;Long Term:  Able to use RPE to guide intensity level when exercising independently       Able to understand and use Dyspnea scale Yes       Intervention Provide education and explanation on how to use Dyspnea scale       Expected Outcomes Short Term: Able to use Dyspnea scale daily in rehab to express subjective sense of shortness of breath during exertion;Long Term: Able to use Dyspnea scale to guide intensity level when exercising independently       Knowledge and understanding of Target Heart Rate Range (THRR) Yes       Intervention Provide education and explanation of THRR including how the numbers were predicted and where they are located for reference       Expected Outcomes Short Term: Able to state/look up THRR;Long Term: Able to use THRR to govern intensity when exercising independently;Short Term: Able to use daily as guideline for intensity in rehab       Able to check pulse independently Yes       Intervention Provide education and demonstration on how to check pulse  in carotid and radial arteries.;Review the importance of being able to check your  own pulse for safety during independent exercise       Expected Outcomes Short Term: Able to explain why pulse checking is important during independent exercise;Long Term: Able to check pulse independently and accurately       Understanding of Exercise Prescription Yes       Intervention Provide education, explanation, and written materials on patient's individual exercise prescription       Expected Outcomes Short Term: Able to explain program exercise prescription;Long Term: Able to explain home exercise prescription to exercise independently                Exercise Goals Re-Evaluation :  Exercise Goals Re-Evaluation     Row Name 08/24/23 1452             Exercise Goal Re-Evaluation   Exercise Goals Review Able to understand and use rate of perceived exertion (RPE) scale;Knowledge and understanding of Target Heart Rate Range (THRR);Able to understand and use Dyspnea scale       Comments Reviewed RPE and dyspnea scale, THR and program prescription with pt today.  Pt voiced understanding and was given a copy of goals to take home.       Expected Outcomes Short: Use RPE daily to regulate intensity.  Long: Follow program prescription in THR.                 Discharge Exercise Prescription (Final Exercise Prescription Changes):  Exercise Prescription Changes - 08/24/23 1200       Response to Exercise   Blood Pressure (Admit) 108/60    Blood Pressure (Exercise) 114/62    Blood Pressure (Exit) 112/64    Heart Rate (Admit) 79 bpm    Heart Rate (Exercise) 83 bpm    Heart Rate (Exit) 67 bpm    Rating of Perceived Exertion (Exercise) 14    Duration Continue with 30 min of aerobic exercise without signs/symptoms of physical distress.    Intensity THRR unchanged      Progression   Progression Continue to progress workloads to maintain intensity without signs/symptoms of physical distress.       Resistance Training   Training Prescription Yes    Weight 2lbs    Reps 10-15      NuStep   Level 1    SPM 37    Minutes 30    METs 1.4      Oxygen   Maintain Oxygen Saturation 88% or higher             Nutrition:  Target Goals: Understanding of nutrition guidelines, daily intake of sodium 1500mg , cholesterol 200mg , calories 30% from fat and 7% or less from saturated fats, daily to have 5 or more servings of fruits and vegetables.  Biometrics:  Pre Biometrics - 08/17/23 1336       Pre Biometrics   Height 4\' 9"  (1.448 m)    Weight 136 lb 9.6 oz (62 kg)    Waist Circumference 37 inches    Hip Circumference 49 inches    Waist to Hip Ratio 0.76 %    BMI (Calculated) 29.55    Grip Strength 20.3 kg              Nutrition Therapy Plan and Nutrition Goals:   Nutrition Assessments:  MEDIFICTS Score Key: >=70 Need to make dietary changes  40-70 Heart Healthy Diet <= 40 Therapeutic Level Cholesterol Diet  Flowsheet Row CARDIAC REHAB PHASE II ORIENTATION from 08/17/2023 in [facility] CARDIAC REHABILITATION  Picture Your Plate  Total Score on Admission 23      Picture Your Plate Scores: <78 Unhealthy dietary pattern with much room for improvement. 41-50 Dietary pattern unlikely to meet recommendations for good health and room for improvement. 51-60 More healthful dietary pattern, with some room for improvement.  >60 Healthy dietary pattern, although there may be some specific behaviors that could be improved.    Nutrition Goals Re-Evaluation:   Nutrition Goals Discharge (Final Nutrition Goals Re-Evaluation):   Psychosocial: Target Goals: Acknowledge presence or absence of significant depression and/or stress, maximize coping skills, provide positive support system. Participant is able to verbalize types and ability to use techniques and skills needed for reducing stress and depression.  Initial Review & Psychosocial Screening:  Initial Psych Review &  Screening - 08/17/23 1308       Initial Review   Current issues with None Identified      Family Dynamics   Good Support System? Yes      Barriers   Psychosocial barriers to participate in program The patient should benefit from training in stress management and relaxation.;There are no identifiable barriers or psychosocial needs.      Screening Interventions   Interventions Encouraged to exercise;To provide support and resources with identified psychosocial needs;Provide feedback about the scores to participant    Expected Outcomes Short Term goal: Utilizing psychosocial counselor, staff and physician to assist with identification of specific Stressors or current issues interfering with healing process. Setting desired goal for each stressor or current issue identified.;Long Term Goal: Stressors or current issues are controlled or eliminated.;Short Term goal: Identification and review with participant of any Quality of Life or Depression concerns found by scoring the questionnaire.;Long Term goal: The participant improves quality of Life and PHQ9 Scores as seen by post scores and/or verbalization of changes             Quality of Life Scores:  Quality of Life - 08/17/23 1336       Quality of Life   Select Quality of Life      Quality of Life Scores   Health/Function Pre 24.4 %    Socioeconomic Pre 29.25 %    Psych/Spiritual Pre 30 %    Family Pre 28.8 %    GLOBAL Pre 27.26 %            Scores of 19 and below usually indicate a poorer quality of life in these areas.  A difference of  2-3 points is a clinically meaningful difference.  A difference of 2-3 points in the total score of the Quality of Life Index has been associated with significant improvement in overall quality of life, self-image, physical symptoms, and general health in studies assessing change in quality of life.  PHQ-9: Review Flowsheet  More data exists      08/17/2023 08/11/2023 08/05/2023 07/22/2023  07/01/2023  Depression screen PHQ 2/9  Decreased Interest 1 0 0 0 0 0  Down, Depressed, Hopeless 0 0 0 0 0 0  PHQ - 2 Score 1 0 0 0 0 0  Altered sleeping 0 - - - -  Tired, decreased energy 1 - - - -  Change in appetite 1 - - - -  Feeling bad or failure about yourself  0 - - - -  Trouble concentrating 0 - - - -  Moving slowly or fidgety/restless 0 - - - -  Suicidal thoughts 0 - - - -  PHQ-9 Score 3 - - - -  Difficult doing work/chores  Somewhat difficult - - - -    Details       Multiple values from one day are sorted in reverse-chronological order        Interpretation of Total Score  Total Score Depression Severity:  1-4 = Minimal depression, 5-9 = Mild depression, 10-14 = Moderate depression, 15-19 = Moderately severe depression, 20-27 = Severe depression   Psychosocial Evaluation and Intervention:  Psychosocial Evaluation - 08/17/23 1308       Psychosocial Evaluation & Interventions   Interventions Stress management education;Relaxation education;Encouraged to exercise with the program and follow exercise prescription    Comments Patient was referred to CR with chronic systolic HF. She was accompained today by her husband and paid caregiver. Her caregiver completed all of her documentation and she and patient's husband answered most questions. Patient was able to sign for herself. They denied any depression or anxiety or major stressors. She is taking Zoloft and clonezapam a bedtime. They said she had been on these medications a long time. She says she sleeps well. She is a retired [employer] fundraiser. She was in a wheelchair today with a gait belt. Her husband says she is able to transfer herself at home using a walker. She did need assistance today transfering from the wheelchair to the nustep. Her goals for the program are to improve her strength and stamina and improve her mobility. 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 Her knee pain and impaired mobility could be a  barrier for completeing the program.    Expected Outcomes Short Term: start the program and attend consistently. Long Term: meet her persoanl goals.    Continue Psychosocial Services  Follow up required by staff             Psychosocial Re-Evaluation:   Psychosocial Discharge (Final Psychosocial Re-Evaluation):   Vocational Rehabilitation: Provide vocational rehab assistance to qualifying candidates.   Vocational Rehab Evaluation & Intervention:  Vocational Rehab - 08/17/23 1305       Initial Vocational Rehab Evaluation & Intervention   Assessment shows need for Vocational Rehabilitation No      Vocational Rehab Re-Evaulation   Comments Retired.             Education: Education Goals: Education classes will be provided on a weekly basis, covering required topics. Participant will state understanding/return demonstration of topics presented.  Learning Barriers/Preferences:   Education Topics: Hypertension, Hypertension Reduction -Define heart disease and high blood pressure. Discus how high blood pressure affects the body and ways to reduce high blood pressure.   Exercise and Your Heart -Discuss why it is important to exercise, the FITT principles of exercise, normal and abnormal responses to exercise, and how to exercise safely.   Angina -Discuss definition of angina, causes of angina, treatment of angina, and how to decrease risk of having angina.   Cardiac Medications -Review what the following cardiac medications are used for, how they affect the body, and side effects that may occur when taking the medications.  Medications include Aspirin, Beta blockers, calcium channel blockers, ACE Inhibitors, angiotensin receptor blockers, diuretics, digoxin, and antihyperlipidemics.   Congestive Heart Failure -Discuss the definition of CHF, how to live with CHF, the signs and symptoms of CHF, and how keep track of weight and sodium intake.   Heart Disease and  Intimacy -Discus the effect sexual activity has on the heart, how changes occur during intimacy as we age, and safety during sexual activity. Flowsheet Row CARDIAC REHAB  PHASE II EXERCISE from 08/26/2023 in [facility] CARDIAC REHABILITATION  Date 08/26/23  Educator jh  Instruction Review Code 1- Verbalizes Understanding       Smoking Cessation / COPD -Discuss different methods to quit smoking, the health benefits of quitting smoking, and the definition of COPD.   Nutrition I: Fats -Discuss the types of cholesterol, what cholesterol does to the heart, and how cholesterol levels can be controlled.   Nutrition II: Labels -Discuss the different components of food labels and how to read food label   Heart Parts/Heart Disease and PAD -Discuss the anatomy of the heart, the pathway of blood circulation through the heart, and these are affected by heart disease.   Stress I: Signs and Symptoms -Discuss the causes of stress, how stress may lead to anxiety and depression, and ways to limit stress.   Stress II: Relaxation -Discuss different types of relaxation techniques to limit stress.   Warning Signs of Stroke / TIA -Discuss definition of a stroke, what the signs and symptoms are of a stroke, and how to identify when someone is having stroke.   Knowledge Questionnaire Score:  Knowledge Questionnaire Score - 08/17/23 1305       Knowledge Questionnaire Score   Pre Score 18/24   Patient's caregiver completed the test.            Core Components/Risk Factors/Patient Goals at Admission:  Personal Goals and Risk Factors at Admission - 08/17/23 1306       Core Components/Risk Factors/Patient Goals on Admission    Weight Management Weight Maintenance;Yes    Intervention Weight Management: Develop a combined nutrition and exercise program designed to reach desired caloric intake, while maintaining appropriate intake of nutrient and fiber, sodium and fats, and appropriate energy  expenditure required for the weight goal.;Weight Management: Provide education and appropriate resources to help participant work on and attain dietary goals.    Admit Weight 136 lb 9.6 oz (62 kg)    Goal Weight: Short Term 136 lb (61.7 kg)    Goal Weight: Long Term 136 lb (61.7 kg)    Expected Outcomes Weight Maintenance: Understanding of the daily nutrition guidelines, which includes 25-35% calories from fat, 7% or less cal from saturated fats, less than 200mg  cholesterol, less than 1.5gm of sodium, & 5 or more servings of fruits and vegetables daily;Long Term: Adherence to nutrition and physical activity/exercise program aimed toward attainment of established weight goal;Short Term: Continue to assess and modify interventions until short term weight is achieved    Improve shortness of breath with ADL's Yes    Intervention Provide education, individualized exercise plan and daily activity instruction to help decrease symptoms of SOB with activities of daily living.    Expected Outcomes Short Term: Improve cardiorespiratory fitness to achieve a reduction of symptoms when performing ADLs;Long Term: Be able to perform more ADLs without symptoms or delay the onset of symptoms    Heart Failure Yes    Intervention Provide a combined exercise and nutrition program that is supplemented with education, support and counseling about heart failure. Directed toward relieving symptoms such as shortness of breath, decreased exercise tolerance, and extremity edema.    Expected Outcomes Improve functional capacity of life;Short term: Attendance in program 2-3 days a week with increased exercise capacity. Reported lower sodium intake. Reported increased fruit and vegetable intake. Reports medication compliance.;Short term: Daily weights obtained and reported for increase. Utilizing diuretic protocols set by physician.;Long term: Adoption of self-care skills and reduction of barriers for  early signs and symptoms  recognition and intervention leading to self-care maintenance.    Hypertension Yes    Intervention Provide education on lifestyle modifcations including regular physical activity/exercise, weight management, moderate sodium restriction and increased consumption of fresh fruit, vegetables, and low fat dairy, alcohol moderation, and smoking cessation.;Monitor prescription use compliance.    Expected Outcomes Short Term: Continued assessment and intervention until BP is < 140/25mm HG in hypertensive participants. < 130/65mm HG in hypertensive participants with diabetes, heart failure or chronic kidney disease.;Long Term: Maintenance of blood pressure at goal levels.             Core Components/Risk Factors/Patient Goals Review:    Core Components/Risk Factors/Patient Goals at Discharge (Final Review):    ITP Comments:  ITP Comments     Row Name 08/24/23 1452 08/26/23 1012 09/02/23 1439       ITP Comments First full day of exercise!  Patient was oriented to gym and equipment including functions, settings, policies, and procedures.  Patient's individual exercise prescription and treatment plan were reviewed.  All starting workloads were established based on the results of the 6 minute walk test done at initial orientation visit.  The plan for exercise progression was also introduced and progression will be customized based on patient's performance and goals. 30 day review completed. ITP sent to Dr. Dina Rich, Medical Director of Cardiac Rehab. Continue with ITP unless changes are made by physician.   New to program. Patient called to let us know that she was not feeling well and wanted to discharge from the program at this time.  Discharge ITP created and sent to Medical Director.              Comments: Discharge ITP

## 2023-09-04 ENCOUNTER — Ambulatory Visit (HOSPITAL_COMMUNITY)
Admission: RE | Admit: 2023-09-04 | Discharge: 2023-09-04 | Disposition: A | Discharge status: Home / Self Care | Payer: Medicare Other | Source: Ambulatory Visit | Attending: Family Medicine | Admitting: Family Medicine

## 2023-09-04 VITALS — BP 100/60 | HR 63 | Wt 147.5 lb

## 2023-09-04 DIAGNOSIS — M109 Gout, unspecified: Secondary | ICD-10-CM | POA: Diagnosis not present

## 2023-09-04 DIAGNOSIS — Z79899 Other long term (current) drug therapy: Secondary | ICD-10-CM | POA: Diagnosis not present

## 2023-09-04 DIAGNOSIS — Z86718 Personal history of other venous thrombosis and embolism: Secondary | ICD-10-CM | POA: Diagnosis not present

## 2023-09-04 DIAGNOSIS — Z7901 Long term (current) use of anticoagulants: Secondary | ICD-10-CM | POA: Insufficient documentation

## 2023-09-04 DIAGNOSIS — I5022 Chronic systolic (congestive) heart failure: Secondary | ICD-10-CM | POA: Insufficient documentation

## 2023-09-04 DIAGNOSIS — M79605 Pain in left leg: Secondary | ICD-10-CM | POA: Insufficient documentation

## 2023-09-04 DIAGNOSIS — N183 Chronic kidney disease, stage 3 unspecified: Secondary | ICD-10-CM | POA: Insufficient documentation

## 2023-09-04 DIAGNOSIS — I471 Supraventricular tachycardia, unspecified: Secondary | ICD-10-CM | POA: Insufficient documentation

## 2023-09-04 DIAGNOSIS — I428 Other cardiomyopathies: Secondary | ICD-10-CM | POA: Insufficient documentation

## 2023-09-04 DIAGNOSIS — K3184 Gastroparesis: Secondary | ICD-10-CM | POA: Diagnosis not present

## 2023-09-04 DIAGNOSIS — R5383 Other fatigue: Secondary | ICD-10-CM | POA: Diagnosis not present

## 2023-09-04 DIAGNOSIS — Z923 Personal history of irradiation: Secondary | ICD-10-CM | POA: Diagnosis not present

## 2023-09-04 DIAGNOSIS — N1832 Chronic kidney disease, stage 3b: Secondary | ICD-10-CM | POA: Diagnosis not present

## 2023-09-04 DIAGNOSIS — R7989 Other specified abnormal findings of blood chemistry: Secondary | ICD-10-CM

## 2023-09-04 DIAGNOSIS — I10 Essential (primary) hypertension: Secondary | ICD-10-CM | POA: Diagnosis not present

## 2023-09-04 DIAGNOSIS — Z853 Personal history of malignant neoplasm of breast: Secondary | ICD-10-CM | POA: Insufficient documentation

## 2023-09-04 DIAGNOSIS — I480 Paroxysmal atrial fibrillation: Secondary | ICD-10-CM

## 2023-09-04 DIAGNOSIS — Z789 Other specified health status: Secondary | ICD-10-CM | POA: Diagnosis not present

## 2023-09-04 DIAGNOSIS — I4819 Other persistent atrial fibrillation: Secondary | ICD-10-CM | POA: Diagnosis not present

## 2023-09-04 DIAGNOSIS — K5909 Other constipation: Secondary | ICD-10-CM | POA: Diagnosis not present

## 2023-09-04 LAB — BASIC METABOLIC PANEL
Anion gap: 11 (ref 5–15)
BUN: 27 mg/dL — ABNORMAL HIGH (ref 8–23)
CO2: 25 mmol/L (ref 22–32)
Calcium: 8.9 mg/dL (ref 8.9–10.3)
Chloride: 106 mmol/L (ref 98–111)
Creatinine, Ser: 1.79 mg/dL — ABNORMAL HIGH (ref 0.44–1.00)
GFR, Estimated: 29 mL/min — ABNORMAL LOW (ref >60)
Glucose, Bld: 88 mg/dL (ref 70–99)
Potassium: 4.7 mmol/L (ref 3.5–5.1)
Sodium: 142 mmol/L (ref 135–145)

## 2023-09-04 LAB — URIC ACID: Uric Acid, Serum: 5.5 mg/dL (ref 2.5–7.1)

## 2023-09-04 LAB — BRAIN NATRIURETIC PEPTIDE: B Natriuretic Peptide: 102.1 pg/mL — ABNORMAL HIGH (ref 0.0–100.0)

## 2023-09-04 MED ORDER — AMIODARONE HCL 200 MG PO TABS: 200.0000 mg | ORAL_TABLET | Freq: Every day | ORAL | 6 refills | Status: DC

## 2023-09-04 MED ORDER — FARXIGA 10 MG PO TABS: 10.0000 mg | ORAL_TABLET | Freq: Every day | ORAL | 3 refills | Status: DC

## 2023-09-04 MED ORDER — APIXABAN 5 MG PO TABS: 5.0000 mg | ORAL_TABLET | Freq: Two times a day (BID) | ORAL | 6 refills | Status: DC

## 2023-09-04 NOTE — Progress Notes (Signed)
PCP: Dr. Lodema Hong Cardiology: Dr. Johney Frame HF Cardiology: Dr. Shirlee Latch  77 y.o. returns for followup of CHF.   Dr. Johney Frame has followed her for a short RP SVT.  This has been quiescent recently on Toprol XL.  No palpitations.   Patient was initially found to have low EF in 2014. Repeat echo in 6/17 showed EF 35-40%. Cardiolite in 8/17 showed fixed apical defect but no evidence for ischemia. She was admitted in 1/18 with PNA, DVT, and CHF exacerbation.  Echo at that time showed EF down to 20-25%.  Troponin was mildly elevated to around 0.6. She was diuresed and discharged.   Patient was diagnosed with breast cancer in 2008 and had lumpectomy, radiation, and chemotherapy which included epirubicin, and anthracycline.  Recurrent cancer in 2019 with right mastectomy and chemo with cyclophosphamide, MTX, and fluorouracil.   She had RHC/LHC in 10/18 showing no significant CAD and mildly elevate PCWP.  Cardiac MRI in 11/18 showed EF 49%, no LGE.   Echo in 5/21 showed EF 45%, global hypokinesis, mildly decreased RV systolic function.  Echo in 7/22 showed EF 55% with normal RV and IVC.    Patient was in atrial fibrillation with RVR in 6/23, converted back to NSR with amiodarone.   Echo in 7/23 showed EF 40-45%, D-shaped septum, moderate RV dysfunction with mild RV enlargement, IVC normal.   She has been hospitalized for aspiration PNA, most recently in 9/24.   Follow up 10/24, in atrial fibrillation and mildly volume overloaded. Lasix increased to 40 daily, Farxiga started and arranged for repeat echo and DCCV.   Echo 11/24 showed EF 30-35%, RV mildly reduced.  DCCV 08/06/23 to NSR   Today she returns for HF follow up with daughter and husband. Overall feeling fair. Currently in Cardiac Rehab twice weekly. Endorses getting stronger. Dyspnea on exertion although able to perform ADLs without shortness of breath. Swelling in legs L>R had been having pain, reports improvement in both. Denies CP, dizziness,  or PND/Orthopnea. Weight at home 142-143 lb. Taking all medications. Torsemide had last been increased to 40 mg daily by Dr. Shirlee Latch for coughing, she and her husband have decreased the dose to 20 mg daily x 3 days.   ReDs today (personally reviewed): 30%   ECG (personally reviewed): NSR 62 bpm   Labs (7/18): K 4.3, creatinine 1.16, TSH normal, LDL 101 Labs (8/18): K 5.3, creatinine 1.45 Labs (10/18): K 4.3, creatinine 1.26 Labs (2/21): K 3.6, creatinine 1.45, BNP 239 Labs (7/21): K 4.9, creatinine 1.47, LDL 119 Labs (11/21): K 4, creatinine 1.38, LDL 89 Labs (4/22): K 4.2, creatinine 1.24 Labs (9/22): LDL 84 Labs (10/22): K 4, creatinine 1.45 Labs (10/24): K 4.3, creatinine 1.95, AST 54, ALT 46, hgb 9.8, BNP 392 Labs (11/24): K 3.9, creatinine 2.16, BNP 332  PMH: 1. SVT: Short R-P SVT, followed by Dr. Johney Frame.  2. DVT in 1/18 in setting of PNA.  She was on Xarelto for about 6 months.  3. Breast cancer: Diagnosed on right in 2008.  She had lumpectomy, radiation, and chemotherapy.  She had fluorouracil, epirubicin, Cytoxan.  - Recent mammogram with right breast calcifications => biopsy with complex sclerosing lesion.  - Recurrent cancer 2019, she had right mastectomy and chemotherapy with cyclophosphamide, MTX, fluorouracil.  4. GERD 5. Hyperlipidemia 6. Chronic systolic CHF: Uncertain cause of cardiomyopathy.  Echo in 2014 showed EF 40%.  It is possible that it is related to epirubicin use (anthracycline).  - Echo (6/17): EF 35-40%, mild LV dilation.  -  Cardiolite (8/17): no ischemia, apical lateral/apical fixed perfusion defect.  - Echo (1/18): EF 20-25%, moderate MR, severe TR, D-shaped IV septum, PASP 35 mmHg.  - RHC/LHC (10/18): No angiographic CAD.  Mean RA 5, PA 44/11 mean 26, mean PCWP 18, CI 3.49.  - Cardiac MRI (11/18): EF 49%, diffuse mild hypokinesis, mild RV dilation with normal function, no LGE.  - Echo (5/21): EF 45%, global hypokinesis, mildly decreased RV systolic  function.  - Echo (7/22): EF 55% with normal RV and IVC. - Echo (7/23): EF 40-45%, D-shaped septum, moderate RV dysfunction with mild RV enlargement, IVC normal.  - Echo (11/24): EF 30-35%, RV systolic function reduced, moderate MR and moderate to severe TR 7. Atrial fibrillation: First noted in 6/23. S/p DCCV 11/24. 8. Meningioma: Resection in 6/23.  9. Aspiration PNA x 3 episodes. 10. CKD stage 3  FH: Colon cancer.  No known heart disease.   SH: Married, never smoked.  Lives in [place].  Retired Engineer, civil (consulting).    ROS: All systems reviewed and negative except as per HPI.   Current Outpatient Medications  Medication Sig Dispense Refill   acetaminophen (TYLENOL) 500 MG tablet Place 500-1,000 mg into feeding tube every 6 (six) hours as needed for mild pain.     amiodarone (PACERONE) 200 MG tablet TAKE 1 TABLET VIA G-TUBE ONCE DAILY. 30 tablet 0   azelastine (ASTELIN) 0.1 % nasal spray Place 2 sprays into both nostrils 2 (two) times daily. Use in each nostril as directed 30 mL 12   bisacodyl 5 MG EC tablet One tablet every 3 days, if needed, for constipation 30 tablet 1   clonazePAM (KLONOPIN) 0.5 MG tablet Take 1 tablet (0.5 mg total) by mouth at bedtime. 30 tablet 5   docusate sodium (COLACE) 100 MG capsule Take 1 capsule (100 mg total) by mouth 2 (two) times daily.     ELIQUIS 5 MG TABS tablet TAKE 1 TABLET VIA G-TUBE 2 TIMES DAILY. 60 tablet 0   FARXIGA 10 MG TABS tablet Take 1 tablet (10 mg total) by mouth daily before breakfast. 90 tablet 3   fluticasone (FLONASE) 50 MCG/ACT nasal spray Place 2 sprays into both nostrils daily. 16 g 6   furosemide (LASIX) 20 MG tablet Take 1 tablet (20 mg total) by mouth daily. 30 tablet 11   hydrocortisone (ANUSOL-HC) 2.5 % rectal cream Place 1 Application rectally 2 (two) times daily. 30 g 3   hydrocortisone (ANUSOL-HC) 25 MG suppository Place 1 suppository (25 mg total) rectally 2 (two) times daily. 12 suppository 3   lacosamide (VIMPAT) 50 MG TABS  tablet Take 1 tablet (50 mg total) by mouth 2 (two) times daily. 180 tablet 2   levETIRAcetam (KEPPRA) 500 MG tablet Take 1 tablet (500 mg total) by mouth 2 (two) times daily. 180 tablet 3   montelukast (SINGULAIR) 10 MG tablet Take 1 tablet (10 mg total) by mouth at bedtime. 30 tablet 3   pantoprazole (PROTONIX) 40 MG tablet TAKE (1) TABLET BY MOUTH TWICE DAILY. 60 tablet 0   polyethylene glycol (MIRALAX) 17 g packet Take one packet mira lax once daily in 8 ounces water 28 each 5   potassium chloride SA (KLOR-CON M) 20 MEQ tablet Take 1 tablet (20 mEq total) by mouth daily. 30 tablet 11   sertraline (ZOLOFT) 25 MG tablet TAKE 1 TABLET BY MOUTH ONCE A DAY. 30 tablet 0   No current facility-administered medications for this encounter.   Wt Readings from Last 3 Encounters:  09/04/23 66.9 kg (147 lb 8 oz)  08/17/23 62 kg (136 lb 9.6 oz)  08/11/23 68.5 kg (151 lb 0.6 oz)   BP 100/60   Pulse 63   Wt 66.9 kg (147 lb 8 oz)   SpO2 94%   BMI 31.92 kg/m   Physical Exam General: Well appearing. No distress on RA. Walked into clinic with walker HEENT: neck supple.   Cardiac: JVP not visible. S1 and S2 present. No murmurs or rub. Resp: Lung sounds clear, bases diminished Abdomen: Soft, non-tender, non-distended. + BS. Extremities: Warm and dry. No rash, cyanosis.  Trace peripheral  edema.  Neuro: Alert and oriented x3. Affect pleasant. Moves all extremities without difficulty.  Assessment/Plan: 1. Chronic systolic CHF: Long-standing cardiomyopathy, since at least 2014.  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  Could be due to epirubicin exposure with 2008 breast cancer treatment.  Cardiac MRI in 11/18 showed EF up to 49% with no LGE.  Echo in 5/21 showed that EF remains in the 45% range.  Echo in 7/22 showed EF up to 55%.  Echo in 7/23 showed EF 40-45%, D-shaped septum, moderate RV dysfunction with mild RV enlargement, IVC normal.  Back in atrial fibrillation 10/24, echo 11/24  showed EF 30-35%. ? If reduction in EF due to atrial fibrillation. NYHA class III symptoms, fatigue appears to be main limiter. She is not volume overloaded today, REDs 30%. GDMT limited by CKD stage 3 & marginal BP.  - Lasix to 20 mg daily + decrease KCL to 20 mEq daily.  - Continue Farxiga 10 mg daily.  No GU symptoms. - BP too soft to add spiro or losartan. Plan to add next if able. - Plan to repeat echo in 2 months with restoration of SR. - Continue compression hose. - Continue CR 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   - Continue amiodarone.  3. CKD stage 3: Baseline SCr 1.9 - Continue SGLT2i. BMET today. 4. Atrial fibrillation: Persistent.  S/p DC-CV 11/24. NSR on ECG today. - Continue Eliquis 5 mg bid. No bleeding issues. - Continue amiodarone 200 mg daily.  LFTs recently elevated, checked today at [facility]. TSH 3.17 (11/24) and will need regular eye exam.  5. Elevated LFTs: Mild.  Noted earlier this month. ?Due to CHF ?due to amiodarone.  - Rechecked at [facility] today, will check. 6. LLE pain: likely MSK vs edema. - DVT study 11/24 negative - Seen by ortho 11/24 exam negative  - Check uric acid ?gout  Follow up in 2-3 months with Dr. Shirlee Latch + echo  [place] Oakley Kossman, NP 09/04/2023

## 2023-09-04 NOTE — Patient Instructions (Addendum)
Thank you for coming in today  If you had labs drawn today, any labs that are abnormal the clinic will call you No news is good news  Medications: No changes  Follow up appointments:  Your physician recommends that you schedule a follow-up appointment in:  2-3 months with Dr. Shirlee Latch with echocardiogram  Your physician has requested that you have an echocardiogram. Echocardiography is a painless test that uses sound waves to create images of your heart. It provides your doctor with information about the size and shape of your heart and how well your heart's chambers and valves are working. This procedure takes approximately one hour. There are no restrictions for this procedure.      Do the following things EVERYDAY: Weigh yourself in the morning before breakfast. Write it down and keep it in a log. Take your medicines as prescribed Eat low salt foods--Limit salt (sodium) to 2000 mg per day.  Stay as active as you can everyday Limit all fluids for the day to less than 2 liters   At [facility], you and your health needs are our priority. As part of our continuing mission to provide you with exceptional heart care, we have created designated Provider Care Teams. These Care Teams include your primary Cardiologist (physician) and Advanced Practice Providers (APPs- Physician Assistants and Nurse Practitioners) who all work together to provide you with the care you need, when you need it.   You may see any of the following providers on your designated Care Team at your next follow up: Dr Arvilla Meres Dr [name] Dr. Marcos Eke, NP Robbie Lis, [place] [facility] [place], [place] Brynda Peon, NP Karle Plumber, PharmD   Please be sure to bring in all your medications bottles to every appointment.    Thank you for choosing [facility]  If you have any questions or concerns before your next  appointment please send [place] a message through [place] or call our office at [phone].    TO LEAVE A MESSAGE FOR THE NURSE SELECT OPTION 2, PLEASE LEAVE A MESSAGE INCLUDING: YOUR NAME DATE OF BIRTH CALL BACK NUMBER REASON FOR CALL**this is important as we prioritize the call backs  YOU WILL RECEIVE A CALL BACK THE SAME DAY AS LONG AS YOU CALL BEFORE 4:00 PM

## 2023-09-07 ENCOUNTER — Encounter (HOSPITAL_COMMUNITY): Payer: Medicare Other

## 2023-09-09 ENCOUNTER — Ambulatory Visit (HOSPITAL_COMMUNITY)
Admission: RE | Admit: 2023-09-09 | Discharge: 2023-09-09 | Disposition: A | Discharge status: Home / Self Care | Payer: Medicare Other | Source: Ambulatory Visit | Attending: Family Medicine | Admitting: Family Medicine

## 2023-09-09 ENCOUNTER — Encounter (HOSPITAL_COMMUNITY): Payer: Medicare Other

## 2023-09-09 DIAGNOSIS — R918 Other nonspecific abnormal finding of lung field: Secondary | ICD-10-CM | POA: Diagnosis not present

## 2023-09-10 ENCOUNTER — Other Ambulatory Visit (HOSPITAL_COMMUNITY): Payer: Self-pay | Admitting: Cardiology

## 2023-09-14 ENCOUNTER — Encounter (HOSPITAL_COMMUNITY): Payer: Medicare Other

## 2023-09-14 DIAGNOSIS — C50911 Malignant neoplasm of unspecified site of right female breast: Secondary | ICD-10-CM | POA: Diagnosis not present

## 2023-09-15 ENCOUNTER — Encounter: Payer: Self-pay | Admitting: Allergy & Immunology

## 2023-09-15 ENCOUNTER — Other Ambulatory Visit: Payer: Self-pay

## 2023-09-15 ENCOUNTER — Ambulatory Visit (INDEPENDENT_AMBULATORY_CARE_PROVIDER_SITE_OTHER): Payer: Medicare Other | Admitting: Allergy & Immunology

## 2023-09-15 VITALS — BP 108/70 | HR 62 | Temp 99.1°F | Resp 18 | Ht <= 58 in | Wt 145.3 lb

## 2023-09-15 DIAGNOSIS — J310 Chronic rhinitis: Secondary | ICD-10-CM

## 2023-09-15 DIAGNOSIS — C50911 Malignant neoplasm of unspecified site of right female breast: Secondary | ICD-10-CM | POA: Diagnosis not present

## 2023-09-15 NOTE — Patient Instructions (Addendum)
1. Chronic rhinitis (Primary) - We will do some blood work to see if you have evidence of allergies.  - We will follow up with skin testing in 2-3 weeks in the [place] office.   2. Return in about 3 weeks (around 10/06/2023) for SKIN TESTING (1-55). You can have the follow up appointment with Dr. Dellis Anes or a Nurse Practicioner (our Nurse Practitioners are excellent and always have Physician oversight!).    Please inform us of any Emergency Department visits, hospitalizations, or changes in symptoms. Call us before going to the ED for breathing or allergy symptoms since we might be able to fit you in for a sick visit. Feel free to contact us anytime with any questions, problems, or concerns.  It was a pleasure to meet you and your family today!  Websites that have reliable patient information: 1. American Academy of Asthma, Allergy, and Immunology: www.aaaai.org 2. Food Allergy Research and Education (FARE): foodallergy.org 3. Mothers of Asthmatics: http://www.asthmacommunitynetwork.org 4. American College of Allergy, Asthma, and Immunology: www.acaai.org      "Like" [place] on Facebook and Instagram for our latest updates!      A healthy democracy works best when [employer] participate! Make sure you are registered to vote! If you have moved or changed any of your contact information, you will need to get this updated before voting! Scan the QR codes below to learn more!

## 2023-09-15 NOTE — Progress Notes (Unsigned)
NEW PATIENT  Date of Service/Encounter:  09/15/23  Consult requested by: Kerri Perches, MD   Assessment:   Chronic rhinitis - getting blood work, would consider doing intradermal testing in the future  Complicated past medical history, including congestive heart failure  Plan/Recommendations:   1. Chronic rhinitis - We will do some blood work to see if you have evidence of allergies.  - We will follow up with skin testing in 2-3 weeks in the [place] office.   2. Return in about 3 weeks (around 10/06/2023) for SKIN TESTING (1-55). You can have the follow up appointment with Dr. Dellis Anes or a Nurse Practicioner (our Nurse Practitioners are excellent and always have Physician oversight!).    This note in its entirety was forwarded to the Provider who requested this consultation.  Subjective:   Tara [name] is a 77 y.o. female presenting today for evaluation of  Chief Complaint  Patient presents with  . seasonal allergies  . Breathing Problem  . Nasal Congestion    Tara [name] has a history of the following: Patient Active Problem List   Diagnosis Date Noted  . Pain and swelling of left lower leg 08/12/2023  . Acute pain of left knee 08/12/2023  . Acute foot pain, left 08/12/2023  . Elevated liver enzymes 08/02/2023  . Encounter for immunization 08/02/2023  . Encounter for Medicare annual examination with abnormal findings 08/02/2023  . Community acquired pneumonia of left lower lobe of lung 06/19/2023  . Dysuria 06/03/2023  . Impaired mobility and ADLs 01/05/2023  . IGT (impaired glucose tolerance) 01/05/2023  . Non-ischemic cardiomyopathy (HCC) 01/05/2023  . Constipation 10/26/2022  . Paroxysmal atrial fibrillation (HCC) 09/20/2022  . Chronic anticoagulation 09/20/2022  . Hiatal hernia 09/20/2022  . Aspiration pneumonia (HCC) 08/14/2022  . Seizure disorder (HCC)   . Lethargy 05/15/2022  . Dysfunction of right cardiac ventricle 05/07/2022  .  Acute postoperative anemia due to expected blood loss 05/07/2022  . Dysphagia 05/07/2022  . Palliative care encounter   . Acute respiratory failure with hypoxia (HCC)   . Aspiration into airway   . Pressure injury of skin 04/07/2022  . Meningioma (HCC) 03/24/2022  . Seizure (HCC) 03/15/2022  . Atrial fibrillation, chronic (HCC) 03/15/2022  . Protein-calorie malnutrition, moderate (HCC) 03/15/2022  . Atrial fibrillation with RVR (HCC)   . Metabolic acidosis 02/27/2022  . Anxiety and depression 02/27/2022  . Meningioma, cerebral (HCC) 02/27/2022  . Normocytic anemia 02/27/2022  . Acute renal failure (ARF) (HCC) 02/27/2022  . Hemorrhoids 01/04/2021  . Ankle deformity, right 09/12/2020  . 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  . Port-A-Cath in place 09/28/2018  . 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 (HCC) 06/09/2018  . Hypertension 01/25/2018  . AKI (acute kidney injury) (HCC) 10/25/2016  . 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 Acute on chronic combined systolic and diastolic CHF (congestive heart failure) (HCC)   . Demand ischemia (HCC)   . Hypokalemia 09/29/2016  . Prolonged Q-T interval on ECG 09/29/2016  . Chronic kidney disease (CKD), stage III (moderate) (HCC) 09/29/2016  . Leukocytosis 09/18/2016  . Abnormal CT scan, chest   . Upper airway cough syndrome  vs Cough variant asthma  04/09/2016  . Chronic cough 03/17/2016  . Seasonal allergies 08/08/2014  . Family history of colon cancer 03/30/2014  . Osteoporosis 02/14/2014  . Metabolic syndrome X 02/05/2014  . SVT (supraventricular tachycardia) (HCC) 03/14/2013  . Chronic HFrEF (heart failure with reduced  ejection fraction) (HCC) 03/14/2013  . Vitamin D deficiency 07/23/2010  . Malignant neoplasm of right breast (HCC) 12/13/2007  . Hyperlipidemia 12/13/2007  . Anxiety state 12/13/2007  . 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  GERD 12/13/2007    History obtained from: chart review and patient and her husband.  Discussed the use of AI scribe software for clinical note transcription with the patient and/or guardian, who gave verbal consent to proceed.  Tara [name] was referred by Kerri Perches, MD.     Tara [name] is a 77 y.o. female presenting for an evaluation of environmental allergies .  Tara [name] presents with a long-standing history of allergies. She reports experiencing allergy symptoms for several years, which have been more [place] The patient describes a history of sneezing and red eyes after walking to the mailbox, which has since been mitigated by her spouse taking over this task. She has been managing her symptoms with a nasal spray and an allergy pill, both of which were started approximately two to three months ago. The patient reports daily use of the nasal spray, but it is unclear how consistently she has been taking the allergy pill.  In addition to her allergies, the patient has a history of heart failure, which causes shortness of breath, particularly when walking. She is currently on Lasix for management of her heart failure symptoms. The patient denies any history of asthma.  The patient also has a history of [place], but denies personal tobacco use. She has not had any allergy testing done in the past. The patient's allergies seem to be seasonal and possibly related to outdoor exposure.  Otherwise, there is no history of other atopic diseases, including asthma, food allergies, drug allergies, stinging insect allergies, or contact dermatitis. There is no significant infectious history. Vaccinations are up to date.    Past Medical History: Patient Active Problem List   Diagnosis Date Noted  . Pain and swelling of left lower leg 08/12/2023  . Acute pain of left knee 08/12/2023  . Acute foot pain, left 08/12/2023  . Elevated liver  enzymes 08/02/2023  . Encounter for immunization 08/02/2023  . Encounter for Medicare annual examination with abnormal findings 08/02/2023  . Community acquired pneumonia of left lower lobe of lung 06/19/2023  . Dysuria 06/03/2023  . Impaired mobility and ADLs 01/05/2023  . IGT (impaired glucose tolerance) 01/05/2023  . Non-ischemic cardiomyopathy (HCC) 01/05/2023  . Constipation 10/26/2022  . Paroxysmal atrial fibrillation (HCC) 09/20/2022  . Chronic anticoagulation 09/20/2022  . Hiatal hernia 09/20/2022  . Aspiration pneumonia (HCC) 08/14/2022  . Seizure disorder (HCC)   . Lethargy 05/15/2022  . Dysfunction of right cardiac ventricle 05/07/2022  . Acute postoperative anemia due to expected blood loss 05/07/2022  . Dysphagia 05/07/2022  . Palliative care encounter   . Acute respiratory failure with hypoxia (HCC)   . Aspiration into airway   . Pressure injury of skin 04/07/2022  . Meningioma (HCC) 03/24/2022  . Seizure (HCC) 03/15/2022  . Atrial fibrillation, chronic (HCC) 03/15/2022  . Protein-calorie malnutrition, moderate (HCC) 03/15/2022  . Atrial fibrillation with RVR (HCC)   . Metabolic acidosis 02/27/2022  . Anxiety and depression 02/27/2022  . Meningioma, cerebral (HCC) 02/27/2022  . Normocytic anemia 02/27/2022  . Acute renal failure (ARF) (HCC) 02/27/2022  . Hemorrhoids 01/04/2021  . Ankle deformity, right 09/12/2020  . 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  . Port-A-Cath  in place 09/28/2018  . 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 (HCC) 06/09/2018  . Hypertension 01/25/2018  . AKI (acute kidney injury) (HCC) 10/25/2016  . 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 Acute on chronic combined systolic and diastolic CHF (congestive heart failure) (HCC)   . Demand ischemia (HCC)   . Hypokalemia 09/29/2016  . Prolonged Q-T interval on ECG 09/29/2016  . Chronic kidney disease (CKD), stage III (moderate) (HCC)  09/29/2016  . Leukocytosis 09/18/2016  . Abnormal CT scan, chest   . Upper airway cough syndrome  vs Cough variant asthma  04/09/2016  . Chronic cough 03/17/2016  . Seasonal allergies 08/08/2014  . Family history of colon cancer 03/30/2014  . Osteoporosis 02/14/2014  . Metabolic syndrome X 02/05/2014  . SVT (supraventricular tachycardia) (HCC) 03/14/2013  . Chronic HFrEF (heart failure with reduced ejection fraction) (HCC) 03/14/2013  . Vitamin D deficiency 07/23/2010  . Malignant neoplasm of right breast (HCC) 12/13/2007  . Hyperlipidemia 12/13/2007  . Anxiety state 12/13/2007  . 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 GERD 12/13/2007    Medication List:  Allergies as of 09/15/2023       Reactions   Aspirin Other (See Comments)   Reports GI bleed-   Lisinopril Cough   Sudafed [pseudoephedrine Hcl]    Pt reports she had drainage in throat that made her throat hurt.         Medication List        Accurate as of September 15, 2023 11:59 PM. If you have any questions, ask your nurse or doctor.          STOP taking these medications    azelastine 0.1 % nasal spray Commonly known as: ASTELIN Stopped by: Alfonse Spruce       TAKE these medications    acetaminophen 500 MG tablet Commonly known as: TYLENOL Place 500-1,000 mg into feeding tube every 6 (six) hours as needed for mild pain.   amiodarone 200 MG tablet Commonly known as: PACERONE Take 1 tablet (200 mg total) by mouth daily.   apixaban 5 MG Tabs tablet Commonly known as: Eliquis Take 1 tablet (5 mg total) by mouth 2 (two) times daily.   bisacodyl 5 MG EC tablet Commonly known as: bisacodyl One tablet every 3 days, if needed, for constipation   clonazePAM 0.5 MG tablet Commonly known as: KlonoPIN Take 1 tablet (0.5 mg total) by mouth at bedtime.   Colace 100 MG capsule Generic drug: docusate sodium Take 1 capsule (100 mg total) by mouth 2 (two) times  daily.   Farxiga 10 MG Tabs tablet Generic drug: dapagliflozin propanediol Take 1 tablet (10 mg total) by mouth daily before breakfast.   fluticasone 50 MCG/ACT nasal spray Commonly known as: FLONASE Place 2 sprays into both nostrils daily.   furosemide 20 MG tablet Commonly known as: LASIX Take 1 tablet (20 mg total) by mouth daily.   hydrocortisone 2.5 % rectal cream Commonly known as: ANUSOL-HC Place 1 Application rectally 2 (two) times daily.   hydrocortisone 25 MG suppository Commonly known as: ANUSOL-HC Place 1 suppository (25 mg total) rectally 2 (two) times daily.   lacosamide 50 MG Tabs tablet Commonly known as: VIMPAT Take 1 tablet (50 mg total) by mouth 2 (two) times daily.   levETIRAcetam 500 MG tablet Commonly known as: KEPPRA Take 1 tablet (500 mg total) by mouth 2 (two) times daily.   montelukast 10 MG tablet Commonly  known as: SINGULAIR Take 1 tablet (10 mg total) by mouth at bedtime.   pantoprazole 40 MG tablet Commonly known as: PROTONIX TAKE (1) TABLET BY MOUTH TWICE DAILY.   polyethylene glycol 17 g packet Commonly known as: MiraLax Take one packet mira lax once daily in 8 ounces water   potassium chloride SA 20 MEQ tablet Commonly known as: KLOR-CON M Take 1 tablet (20 mEq total) by mouth daily.   sertraline 25 MG tablet Commonly known as: ZOLOFT TAKE 1 TABLET BY MOUTH ONCE A DAY.        Birth History: non-contributory  Developmental History: Tara [name] has met all milestones on time. She has required no non-contributory  Past Surgical History: Past Surgical History:  Procedure Laterality Date  . ABDOMINAL HYSTERECTOMY  1994   fibroids,   . BIOPSY  06/19/2016   Procedure: BIOPSY;  Surgeon: Corbin Ade, MD;  Location: [facility];  Service: Endoscopy;;  gastric duodenum  . BREAST BIOPSY Right 12/16/2006   malignant  . BREAST EXCISIONAL BIOPSY Right 2018   benign lumpectomy  . BREAST LUMPECTOMY Right 02/2007  . BREAST  LUMPECTOMY WITH RADIOACTIVE SEED LOCALIZATION Right 07/29/2017   Procedure: RIGHT BREAST LUMPECTOMY WITH RADIOACTIVE SEED LOCALIZATION;  Surgeon: Claud Kelp, MD;  Location[facility] OR;  Service: General;  Laterality: Right;  . BREAST SURGERY Right 2008   lumpectomy, cancer  . CARDIAC CATHETERIZATION    . CARDIOVERSION N/A 08/06/2023   Procedure: CARDIOVERSION (CATH LAB);  Surgeon: Laurey Morale, MD;  Location: [facility] INVASIVE CV LAB;  Service: Cardiovascular;  Laterality: N/A;  . CHOLECYSTECTOMY  1999  . COLONOSCOPY  2008   Dr. Darrick Penna: multiple polyps. Path not available at time of visit.   Marland Kitchen COLONOSCOPY WITH PROPOFOL N/A 04/25/2014   Dr. Darrick Penna: Multiple tubular adenomas removed. Diverticulosis. Moderate internal hemorrhoids. Next colonoscopy planned for August 2018.  Marland Kitchen CRANIOTOMY Bilateral 03/24/2022   Procedure: Bifrontal craniotomy for resection of meningioma;  Surgeon: Coletta Memos, MD;  Location: [facility] OR;  Service: Neurosurgery;  Laterality: Bilateral;  . ESOPHAGOGASTRODUODENOSCOPY (EGD) WITH PROPOFOL N/A 06/19/2016   Procedure: ESOPHAGOGASTRODUODENOSCOPY (EGD) WITH PROPOFOL;  Surgeon: Corbin Ade, MD;  Location: [facility];  Service: Endoscopy;  Laterality: N/A;  . MASTECTOMY W/ SENTINEL NODE BIOPSY Right 06/09/2018   Procedure: RIGHT TOTAL MASTECTOMY WITH SENTINEL LYMPH NODE BIOPSY;  Surgeon: Claud Kelp, MD;  Location: [facility] OR;  Service: General;  Laterality: Right;  . POLYPECTOMY N/A 04/25/2014   Procedure: POLYPECTOMY;  Surgeon: West Bali, MD;  Location: [facility] ORS;  Service: Endoscopy;  Laterality: N/A;  Ascending and Decending Colon x3 , Transverse colon x2, rectal  . PORTACATH PLACEMENT N/A 06/09/2018   Procedure: INSERTION PORT-A-CATH;  Surgeon: Claud Kelp, MD;  Location: [facility] OR;  Service: General;  Laterality: N/A;  . RIGHT/LEFT HEART CATH AND CORONARY ANGIOGRAPHY N/A 07/10/2017   Procedure: RIGHT/LEFT HEART CATH AND CORONARY ANGIOGRAPHY;  Surgeon: Laurey Morale,  MD;  Location: [facility] INVASIVE CV LAB;  Service: Cardiovascular;  Laterality: N/A;     Family History: Family History  Problem Relation Age of Onset  . Dementia Mother   . Stroke Mother 16       left hemiparesis  . Colon cancer Father 73       died at age 68  . Breast cancer Sister   . Hypertension Sister   . Heart disease Sister   . Cancer Sister        unknown form  . Cancer Paternal Aunt  NOS  . Other Paternal Aunt        old age  . Lung cancer Paternal Uncle   . Other Paternal Uncle        lightning strike  . Other Paternal Uncle        hit by train  . Cancer Cousin        NOS pat first cousin  . Cancer Cousin        NOS pat first cousin  . Prostate cancer Cousin        pat first cousin  . Cancer Brother 54       prostate     Social History: Akaylia lives at home with ***.    Review of systems otherwise negative other than that mentioned in the HPI.    Objective:   Blood pressure 108/70, pulse 62, temperature 99.1 F (37.3 C), temperature source Temporal, resp. rate 18, height 4' 9.25" (1.454 m), weight 145 lb 4.8 oz (65.9 kg), SpO2 95%. Body mass index is 31.17 kg/m.     Physical Exam   Diagnostic studies: deferred due to insurance stipulations that require a separate visit for testing         Malachi Bonds, MD [facility]

## 2023-09-17 DIAGNOSIS — C50911 Malignant neoplasm of unspecified site of right female breast: Secondary | ICD-10-CM | POA: Diagnosis not present

## 2023-09-18 LAB — ALLERGEN PROFILE, MOLD
Aureobasidi Pullulans IgE: 0.44 kU/L — AB
Candida Albicans IgE: 0.89 kU/L — AB
M009-IgE Fusarium proliferatum: 0.21 kU/L — AB
M014-IgE Epicoccum purpur: 0.15 kU/L — AB
Mucor Racemosus IgE: 0.22 kU/L — AB
Phoma Betae IgE: 0.11 kU/L — AB
Setomelanomma Rostrat: 0.34 kU/L — AB
Stemphylium Herbarum IgE: 0.44 kU/L — AB

## 2023-09-18 LAB — ALLERGENS W/COMP RFLX AREA 2
Alternaria Alternata IgE: <0.1 kU/L
Aspergillus Fumigatus IgE: <0.1 kU/L
Bermuda Grass IgE: 0.16 kU/L — AB
Cedar, Mountain IgE: 0.33 kU/L — AB
Cladosporium Herbarum IgE: 0.17 kU/L — AB
Cockroach, German IgE: 0.43 kU/L — AB
Common Silver Birch IgE: 0.12 kU/L — AB
Cottonwood IgE: <0.1 kU/L
D Farinae IgE: 0.86 kU/L — AB
D Pteronyssinus IgE: 1.21 kU/L — AB
E001-IgE Cat Dander: <0.1 kU/L
E005-IgE Dog Dander: <0.1 kU/L
Elm, American IgE: 0.12 kU/L — AB
IgE (Immunoglobulin E), Serum: 1025 [IU]/mL — ABNORMAL HIGH (ref 6–495)
Johnson Grass IgE: 0.48 kU/L — AB
Maple/Box Elder IgE: <0.1 kU/L
Mouse Urine IgE: <0.1 kU/L
Oak, White IgE: 0.12 kU/L — AB
Pecan, Hickory IgE: 0.16 kU/L — AB
Penicillium Chrysogen IgE: <0.1 kU/L
Pigweed, Rough IgE: <0.1 kU/L
Ragweed, Short IgE: <0.1 kU/L
Sheep Sorrel IgE Qn: 0.13 kU/L — AB
Timothy Grass IgE: 3.14 kU/L — AB
White Mulberry IgE: <0.1 kU/L

## 2023-09-20 ENCOUNTER — Encounter: Payer: Self-pay | Admitting: Allergy & Immunology

## 2023-09-21 ENCOUNTER — Encounter (HOSPITAL_COMMUNITY): Payer: Medicare Other

## 2023-09-28 ENCOUNTER — Encounter (HOSPITAL_COMMUNITY): Payer: Medicare Other

## 2023-09-29 ENCOUNTER — Other Ambulatory Visit: Payer: Self-pay | Admitting: Family Medicine

## 2023-09-30 ENCOUNTER — Encounter (HOSPITAL_COMMUNITY): Payer: Medicare Other

## 2023-10-01 ENCOUNTER — Encounter: Payer: Self-pay | Admitting: Family Medicine

## 2023-10-01 ENCOUNTER — Encounter: Payer: Self-pay | Admitting: Allergy & Immunology

## 2023-10-05 ENCOUNTER — Encounter (HOSPITAL_COMMUNITY): Payer: Medicare Other

## 2023-10-07 ENCOUNTER — Ambulatory Visit: Payer: Medicare Other | Admitting: Allergy & Immunology

## 2023-10-07 ENCOUNTER — Encounter: Payer: Self-pay | Admitting: Allergy & Immunology

## 2023-10-07 ENCOUNTER — Encounter (HOSPITAL_COMMUNITY): Payer: Medicare Other

## 2023-10-07 DIAGNOSIS — J302 Other seasonal allergic rhinitis: Secondary | ICD-10-CM

## 2023-10-07 DIAGNOSIS — J3089 Other allergic rhinitis: Secondary | ICD-10-CM

## 2023-10-07 MED ORDER — FLUTICASONE PROPIONATE 50 MCG/ACT NA SUSP: 2.0000 | Freq: Every day | NASAL | 3 refills | Status: DC

## 2023-10-07 MED ORDER — MONTELUKAST SODIUM 10 MG PO TABS: 10.0000 mg | ORAL_TABLET | Freq: Every day | ORAL | 3 refills | Status: DC

## 2023-10-07 NOTE — Patient Instructions (Addendum)
 1. Perennial and seasonal allergic rhinitis - Blood testing was positive to grasses, trees, weeds, outdoor molds, dust mites, and cockroach - Testing today was positive to two indoor mold mixes.  - Continue with Singulair  (montelukast ) 10mg  daily, - Continue with the Flonase  (fluticasone ) one spray per nostril. - We can hold off on allergy  shots for now, but we can revisit this at the next visit.   2. Return in about 6 months (around 04/05/2024). You can have the follow up appointment with Dr. Idolina Maker or a Nurse Practicioner (our Nurse Practitioners are excellent and always have Physician oversight!).    Please inform us  of any Emergency Department visits, hospitalizations, or changes in symptoms. Call us  before going to the ED for breathing or allergy  symptoms since we might be able to fit you in for a sick visit. Feel free to contact us  anytime with any questions, problems, or concerns.  It was a pleasure to meet you and your family today!  Websites that have reliable patient information: 1. American Academy of Asthma, Allergy , and Immunology: www.aaaai.org 2. Food Allergy  Research and Education (FARE): foodallergy.org 3. Mothers of Asthmatics: http://www.asthmacommunitynetwork.org 4. [employer] of Allergy , Asthma, and Immunology: www.acaai.org      "Like" us  on Facebook and Instagram for our latest updates!      A healthy democracy works best when [employer] participate! Make sure you are registered to vote! If you have moved or changed any of your contact information, you will need to get this updated before voting! Scan the QR codes below to learn more!     Component     Latest Ref Rng 09/15/2023  IgE (Immunoglobulin E), Serum     6 - 495 IU/mL 1,025 (H)   D Pteronyssinus IgE     Class II kU/L 1.21 !   D Farinae IgE     Class II kU/L 0.86 !   Cat Dander IgE     Class 0 kU/L <0.10   Dog Dander IgE     Class 0 kU/L <0.10   [place] Grass IgE     Class 0/I kU/L  0.16 !   Timothy Grass IgE     Class III kU/L 3.14 !   Johnson Grass IgE     Class I kU/L 0.48 !   Cockroach, German IgE     Class I kU/L 0.43 !   Penicillium Chrysogen IgE     Class 0 kU/L <0.10   Cladosporium Herbarum IgE     Class 0/I kU/L 0.17 !   Aspergillus Fumigatus IgE     Class 0 kU/L <0.10   Alternaria Alternata IgE     Class 0 kU/L <0.10   Maple/Box Elder IgE     Class 0 kU/L <0.10   Common Silver Amelia Jurist IgE     Class 0/I kU/L 0.12 !   Cedar, [place] IgE     Class I kU/L 0.33 !   Oak, [place] IgE     Class 0/I kU/L 0.12 !   Elm, American IgE     Class 0/I kU/L 0.12 !   C[place] IgE     Class 0 kU/L <0.10   Pecan, [place] IgE     Class 0/I kU/L 0.16 !   White Mulberry IgE     Class 0 kU/L <0.10   Ragweed, Short IgE     Class 0 kU/L <0.10   Pigweed, Rough IgE     Class 0 kU/L <0.10   Sheep Sorrel  IgE Qn     Class 0/I kU/L 0.13 !   Mouse Urine IgE     Class 0 kU/L <0.10      Component     Latest Ref Rng 09/15/2023  Mucor Racemosus IgE     Class 0/I kU/L 0.22 !   Candida Albicans IgE     Class II kU/L 0.89 !   Setomelanomma Rostrat     Class I kU/L 0.34 !   M009-IgE Fusarium proliferatum     Class 0/I kU/L 0.21 !   Stemphylium Herbarum IgE     Class I kU/L 0.44 !   Aureobasidi Pullulans IgE     Class I kU/L 0.44 !   Phoma Betae IgE     Class 0/I kU/L 0.11 !   M014-IgE Epicoccum purpur     Class 0/I kU/L 0.15 !      Intradermal - 10/07/23 1134     Time Antigen Placed 1100    Allergen Manufacturer Greer    Location Arm    Number of Test 8    Control Negative    Bahia Negative    Mold 2 2+    Mold 4 3+    Cat Negative    Comments DOG: NEG             Reducing Pollen Exposure  The American Academy of Allergy , Asthma and Immunology suggests the following steps to reduce your exposure to pollen during allergy  seasons.    Do not hang sheets or clothing out to dry; pollen may collect on these items. Do not mow lawns or spend time  around freshly cut grass; mowing stirs up pollen. Keep windows closed at night.  Keep car windows closed while driving. Minimize morning activities outdoors, a time when pollen counts are usually at their highest. Stay indoors as much as possible when pollen counts or humidity is high and on windy days when pollen tends to remain in the air longer. Use air conditioning when possible.  Many air conditioners have filters that trap the pollen spores. Use a HEPA room air filter to remove pollen form the indoor air you breathe.  Control of Mold Allergen   Mold and fungi can grow on a variety of surfaces provided certain temperature and moisture conditions exist.  Outdoor molds grow on plants, decaying vegetation and soil.  The major outdoor mold, Alternaria and Cladosporium, are found in very high numbers during hot and dry conditions.  Generally, a late Summer - Fall peak is seen for common outdoor fungal spores.  Rain will temporarily lower outdoor mold spore count, but counts rise rapidly when the rainy period ends.  The most important indoor molds are Aspergillus and Penicillium.  Dark, humid and poorly ventilated basements are ideal sites for mold growth.  The next most common sites of mold growth are the bathroom and the kitchen.  Outdoor (Seasonal) Mold Control   Use air conditioning and keep windows closed Avoid exposure to decaying vegetation. Avoid leaf raking. Avoid grain handling. Consider wearing a face mask if working in moldy areas.    Indoor (Perennial) Mold Control    Maintain humidity below 50%. Clean washable surfaces with 5% bleach solution. Remove sources e.g. contaminated carpets.    Control of Dust Mite Allergen    Dust mites play a major role in allergic asthma and rhinitis.  They occur in environments with high humidity wherever human skin is found.  Dust mites absorb humidity from the atmosphere (ie, they  do not drink) and feed on organic matter (including shed  human and animal skin).  Dust mites are a microscopic type of insect that you cannot see with the naked eye.  High levels of dust mites have been detected from mattresses, pillows, carpets, upholstered furniture, bed covers, clothes, soft toys and any woven material.  The principal allergen of the dust mite is found in its feces.  A gram of dust may contain 1,000 mites and 250,000 fecal particles.  Mite antigen is easily measured in the air during house cleaning activities.  Dust mites do not bite and do not cause harm to humans, other than by triggering allergies/asthma.    Ways to decrease your exposure to dust mites in your home:  Encase mattresses, box springs and pillows with a mite-impermeable barrier or cover   Wash sheets, blankets and drapes weekly in hot water  (130 F) with detergent and dry them in a dryer on the hot setting.  Have the room cleaned frequently with a vacuum cleaner and a damp dust-mop.  For carpeting or rugs, vacuuming with a vacuum cleaner equipped with a high-efficiency particulate air (HEPA) filter.  The dust mite allergic individual should not be in a room which is being cleaned and should wait 1 hour after cleaning before going into the room. Do not sleep on upholstered furniture (eg, couches).   If possible removing carpeting, upholstered furniture and drapery from the home is ideal.  Horizontal blinds should be eliminated in the rooms where the person spends the most time (bedroom, study, television room).  Washable vinyl, roller-type shades are optimal. Remove all non-washable stuffed toys from the bedroom.  Wash stuffed toys weekly like sheets and blankets above.   Reduce indoor humidity to less than 50%.  Inexpensive humidity monitors can be purchased at most hardware stores.  Do not use a humidifier as can make the problem worse and are not recommended.  Allergy  Shots  Allergies are the result of a chain reaction that starts in the immune system. Your immune system  controls how your body defends itself. For instance, if you have an allergy  to pollen, your immune system identifies pollen as an invader or allergen. Your immune system overreacts by producing antibodies called Immunoglobulin E (IgE). These antibodies travel to cells that release chemicals, causing an allergic reaction.  The concept behind allergy  immunotherapy, whether it is received in the form of shots or tablets, is that the immune system can be desensitized to specific allergens that trigger allergy  symptoms. Although it requires time and patience, the payback can be long-term relief. Allergy  injections contain a dilute solution of those substances that you are allergic to based upon your skin testing and allergy  history.   How Do Allergy  Shots Work?  Allergy  shots work much like a vaccine. Your body responds to injected amounts of a particular allergen given in increasing doses, eventually developing a resistance and tolerance to it. Allergy  shots can lead to decreased, minimal or no allergy  symptoms.  There generally are two phases: build-up and maintenance. Build-up often ranges from three to six months and involves receiving injections with increasing amounts of the allergens. The shots are typically given once or twice a week, though more rapid build-up schedules are sometimes used.  The maintenance phase begins when the most effective dose is reached. This dose is different for each person, depending on how allergic you are and your response to the build-up injections. Once the maintenance dose is reached, there are longer periods  between injections, typically two to four weeks.  Occasionally doctors give cortisone-type shots that can temporarily reduce allergy  symptoms. These types of shots are different and should not be confused with allergy  immunotherapy shots.  Who Can Be Treated with Allergy  Shots?  Allergy  shots may be a good treatment approach for people with allergic rhinitis  (hay fever), allergic asthma, conjunctivitis (eye allergy ) or stinging insect allergy .   Before deciding to begin allergy  shots, you should consider:   The length of allergy  season and the severity of your symptoms  Whether medications and/or changes to your environment can control your symptoms  Your desire to avoid long-term medication use  Time: allergy  immunotherapy requires a major time commitment  Cost: may vary depending on your insurance coverage  Allergy  shots for children age 32 and older are effective and often well tolerated. They might prevent the onset of new allergen sensitivities or the progression to asthma.  Allergy  shots are not started on patients who are pregnant but can be continued on patients who become pregnant while receiving them. In some patients with other medical conditions or who take certain common medications, allergy  shots may be of risk. It is important to mention other medications you talk to your allergist.   What are the two types of build-ups offered:   RUSH or Rapid Desensitization -- one day of injections lasting from 8:30-4:30pm, injections every 1 hour.  Approximately half of the build-up process is completed in that one day.  The following week, normal build-up is resumed, and this entails ~16 visits either weekly or twice weekly, until reaching your "maintenance dose" which is continued weekly until eventually getting spaced out to every month for a duration of 3 to 5 years. The regular build-up appointments are nurse visits where the injections are administered, followed by required monitoring for 30 minutes.    Traditional build-up -- weekly visits for 6 -12 months until reaching "maintenance dose", then continue weekly until eventually spacing out to every 4 weeks as above. At these appointments, the injections are administered, followed by required monitoring for 30 minutes.     Either way is acceptable, and both are equally effective. With the  rush protocol, the advantage is that less time is spent here for injections overall AND you would also reach maintenance dosing faster (which is when the clinical benefit starts to become more apparent). Not everyone is a candidate for rapid desensitization.   IF we proceed with the RUSH protocol, there are premedications which must be taken the day before and the day after the rush only (this includes antihistamines, steroids, and Singulair ).  After the rush day, no prednisone  or Singulair  is required, and we just recommend antihistamines taken on your injection day.  What Is An Estimate of the Costs?  If you are interested in starting allergy  injections, please check with your insurance company about your coverage for both allergy  vial sets and allergy  injections.  Please do so prior to making the appointment to start injections.  The following are CPT codes to give to your insurance company. These are the amounts we BILL to the insurance company, but the amount YOU WILL PAY and WE RECEIVE IS SUBSTANTIALLY LESS and depends on the contracts we have with different insurance companies.   Amount Billed to Insurance One allergy  vial set  CPT 95165   $ 1200     Two allergy  vial set  CPT 95165   $ 2400     Three allergy  vial set  CPT 95165   $ 3600     One injection   CPT 95115   $ 35  Two injections   CPT 95117   $ 40 RUSH (Rapid Desensitization) CPT 95180 x 8 hours $500/hour  Regarding the allergy  injections, your co-pay may or may not apply with each injection, so please confirm this with your insurance company. When you start allergy  injections, 1 or 2 sets of vials are made based on your allergies.  Not all patients can be on one set of vials. A set of vials lasts 6 months to a year depending on how quickly you can proceed with your build-up of your allergy  injections. Vials are personalized for each patient depending on their specific allergens.  How often are allergy  injection given during the  build-up period?   Injections are given at least weekly during the build-up period until your maintenance dose is achieved. Per the doctor's discretion, you may have the option of getting allergy  injections two times per week during the build-up period. However, there must be at least 48 hours between injections. The build-up period is usually completed within 6-12 months depending on your ability to schedule injections and for adjustments for reactions. When maintenance dose is reached, your injection schedule is gradually changed to every two weeks and later to every three weeks. Injections will then continue every 4 weeks. Usually, injections are continued for a total of 3-5 years.   When Will I Feel Better?  Some may experience decreased allergy  symptoms during the build-up phase. For others, it may take as long as 12 months on the maintenance dose. If there is no improvement after a year of maintenance, your allergist will discuss other treatment options with you.  If you aren't responding to allergy  shots, it may be because there is not enough dose of the allergen in your vaccine or there are missing allergens that were not identified during your allergy  testing. Other reasons could be that there are high levels of the allergen in your environment or major exposure to non-allergic triggers like tobacco smoke.  What Is the Length of Treatment?  Once the maintenance dose is reached, allergy  shots are generally continued for three to five years. The decision to stop should be discussed with your allergist at that time. Some people may experience a permanent reduction of allergy  symptoms. Others may relapse and a longer course of allergy  shots can be considered.  What Are the Possible Reactions?  The two types of adverse reactions that can occur with allergy  shots are local and systemic. Common local reactions include very mild redness and swelling at the injection site, which can happen  immediately or several hours after. Report a delayed reaction from your last injection. These include arm swelling or runny nose, watery eyes or cough that occurs within 12-24 hours after injection. A systemic reaction, which is less common, affects the entire body or a particular body system. They are usually mild and typically respond quickly to medications. Signs include increased allergy  symptoms such as sneezing, a stuffy nose or hives.   Rarely, a serious systemic reaction called anaphylaxis can develop. Symptoms include swelling in the throat, wheezing, a feeling of tightness in the chest, nausea or dizziness. Most serious systemic reactions develop within 30 minutes of allergy  shots. This is why it is strongly recommended you wait in your doctor's office for 30 minutes after your injections. Your allergist is trained to watch for reactions, and his or her staff is trained  and equipped with the proper medications to identify and treat them.   Report to the nurse immediately if you experience any of the following symptoms: swelling, itching or redness of the skin, hives, watery eyes/nose, breathing difficulty, excessive sneezing, coughing, stomach pain, diarrhea, or light headedness. These symptoms may occur within 15-20 minutes after injection and may require medication.   Who Should Administer Allergy  Shots?  The preferred location for receiving shots is your prescribing allergist's office. Injections can sometimes be given at another facility where the physician and staff are trained to recognize and treat reactions, and have received instructions by your prescribing allergist.  What if I am late for an injection?   Injection dose will be adjusted depending upon how many days or weeks you are late for your injection.   What if I am sick?   Please report any illness to the nurse before receiving injections. She may adjust your dose or postpone injections depending on your symptoms. If you  have fever, flu, sinus infection or chest congestion it is best to postpone allergy  injections until you are better. Never get an allergy  injection if your asthma is causing you problems. If your symptoms persist, seek out medical care to get your health problem under control.  What If I am or Become Pregnant:  Women that become pregnant should schedule an appointment with [facility] before receiving any further allergy  injections.

## 2023-10-07 NOTE — Progress Notes (Signed)
 FOLLOW UP  Date of Service/Encounter:  10/07/23   Assessment:   Chronic rhinitis - getting blood work, would consider doing intradermal testing in the future   Complicated past medical history, including congestive heart failure  Plan/Recommendations:   1. Perennial and seasonal allergic rhinitis - Blood testing was positive to grasses, trees, weeds, outdoor molds, dust mites, and cockroach - Testing today was positive to two indoor mold mixes.  - Continue with Singulair  (montelukast ) 10mg  daily, - Continue with the Flonase  (fluticasone ) one spray per nostril. - We can hold off on allergy  shots for now, but we can revisit this at the next visit.   2. Return in about 6 months (around 04/05/2024). You can have the follow up appointment with Dr. Idolina Maker or a Nurse Practicioner (our Nurse Practitioners are excellent and always have Physician oversight!).   Subjective:   Tara [name] is a 78 y.o. female presenting today for follow up of No chief complaint on file.   Tara [name] has a history of the following: Patient Active Problem List   Diagnosis Date Noted   Pain and swelling of left lower leg 08/12/2023   Acute pain of left knee 08/12/2023   Acute foot pain, left 08/12/2023   Elevated liver enzymes 08/02/2023   Encounter for immunization 08/02/2023   Encounter for Medicare annual examination with abnormal findings 08/02/2023   Community acquired pneumonia of left lower lobe of lung 06/19/2023   Dysuria 06/03/2023   Impaired mobility and ADLs 01/05/2023   IGT (impaired glucose tolerance) 01/05/2023   Non-ischemic cardiomyopathy (HCC) 01/05/2023   Constipation 10/26/2022   Paroxysmal atrial fibrillation (HCC) 09/20/2022   Chronic anticoagulation 09/20/2022   Hiatal hernia 09/20/2022   Aspiration pneumonia (HCC) 08/14/2022   Seizure disorder (HCC)    Lethargy 05/15/2022   Dysfunction of right cardiac ventricle 05/07/2022   Acute postoperative anemia due to  expected blood loss 05/07/2022   Dysphagia 05/07/2022   Palliative care encounter    Acute respiratory failure with hypoxia (HCC)    Aspiration into airway    Pressure injury of skin 04/07/2022   Meningioma (HCC) 03/24/2022   Seizure (HCC) 03/15/2022   Atrial fibrillation, chronic (HCC) 03/15/2022   Protein-calorie malnutrition, moderate (HCC) 03/15/2022   Atrial fibrillation with RVR (HCC)    Metabolic acidosis 02/27/2022   Anxiety and depression 02/27/2022   Meningioma, cerebral (HCC) 02/27/2022   Normocytic anemia 02/27/2022   Acute renal failure (ARF) (HCC) 02/27/2022   Hemorrhoids 01/04/2021   Ankle deformity, right 09/12/2020   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   Port-A-Cath in place 09/28/2018   Colon adenomas 08/06/2018   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 (HCC) 06/09/2018   Hypertension 01/25/2018   AKI (acute kidney injury) (HCC) 10/25/2016   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   Acute on chronic combined systolic and diastolic CHF (congestive heart failure) (HCC)    Demand ischemia (HCC)    Hypokalemia 09/29/2016   Prolonged Q-T interval on ECG 09/29/2016   Chronic kidney disease (CKD), stage III (moderate) (HCC) 09/29/2016   Leukocytosis 09/18/2016   Abnormal CT scan, chest    Upper airway cough syndrome  vs Cough variant asthma  04/09/2016   Chronic cough 03/17/2016   Seasonal allergies 08/08/2014   Family history of colon cancer 03/30/2014   Osteoporosis 02/14/2014   Metabolic syndrome X 02/05/2014   SVT (supraventricular tachycardia) (HCC) 03/14/2013   Chronic HFrEF (heart failure with reduced ejection fraction) (HCC) 03/14/2013   Vitamin  D deficiency 07/23/2010   Malignant neoplasm of right breast (HCC) 12/13/2007   Hyperlipidemia 12/13/2007   Anxiety state 12/13/2007   Depression, major, single episode, 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  Discussed the use of AI scribe software for clinical note transcription with the patient and/or guardian, who gave verbal consent to proceed.  Tara [name] is a 78 y.o. female presenting for skin testing. She was last seen on December 24. We could not do testing because her insurance company does not cover testing on the same day as a New Patient visit. She has been off of all antihistamines 3 days in anticipation of the testing.   We did blood work to look for environmental allergies.  Labs were positive to grasses, trees, weeds, outdoor molds, dust mites, and cockroach.  We decided to do intradermal testing to clarify the picture even more.  Otherwise, there have been no changes to her past medical history, surgical history, family history, or social history.    Review of systems otherwise negative other than that mentioned in the HPI.    Objective:   There were no vitals taken for this visit. There is no height or weight on file to calculate BMI.    Physical exam deferred since this was a skin testing appointment only.   Diagnostic studies:   Allergy  Studies:     Intradermal - 10/07/23 1134     Time Antigen Placed 1100    Allergen Manufacturer Greer    Location Arm    Number of Test 8    Control Negative    Bahia Negative    Mold 2 2+    Mold 4 3+    Cat Negative    Comments DOG: NEG             Allergy  testing results were read and interpreted by myself, documented by clinical staff.      Drexel Gentles, MD  [facility] 

## 2023-10-12 ENCOUNTER — Encounter (HOSPITAL_COMMUNITY): Payer: Medicare Other

## 2023-10-13 ENCOUNTER — Encounter (HOSPITAL_COMMUNITY): Payer: Self-pay | Admitting: Cardiology

## 2023-10-13 DIAGNOSIS — N184 Chronic kidney disease, stage 4 (severe): Secondary | ICD-10-CM | POA: Diagnosis not present

## 2023-10-13 DIAGNOSIS — I10 Essential (primary) hypertension: Secondary | ICD-10-CM | POA: Diagnosis not present

## 2023-10-14 ENCOUNTER — Encounter (HOSPITAL_COMMUNITY): Payer: Medicare Other

## 2023-10-14 DIAGNOSIS — Z961 Presence of intraocular lens: Secondary | ICD-10-CM | POA: Diagnosis not present

## 2023-10-15 NOTE — Telephone Encounter (Signed)
FMLA  Noted  Copied Sleeved  Original in PCP box Copy front desk folder

## 2023-10-15 NOTE — Telephone Encounter (Signed)
Per cardiac rehab a new referral is needed    Reviewed with Dr Shirlee Latch ok to place new referral

## 2023-10-19 ENCOUNTER — Encounter (HOSPITAL_COMMUNITY): Payer: Medicare Other

## 2023-10-21 ENCOUNTER — Encounter (HOSPITAL_COMMUNITY): Payer: Medicare Other

## 2023-10-23 ENCOUNTER — Telehealth: Payer: Self-pay | Admitting: Family Medicine

## 2023-10-23 ENCOUNTER — Encounter (HOSPITAL_COMMUNITY)
Admission: RE | Admit: 2023-10-23 | Discharge: 2023-10-23 | Disposition: A | Discharge status: Home / Self Care | Payer: Medicare Other | Source: Ambulatory Visit | Attending: Cardiology | Admitting: Cardiology

## 2023-10-23 DIAGNOSIS — I5022 Chronic systolic (congestive) heart failure: Secondary | ICD-10-CM | POA: Insufficient documentation

## 2023-10-23 NOTE — Telephone Encounter (Signed)
Called patient daughter Netty Starring forms are ready for pick. They are in the front desk completed folder.

## 2023-10-26 ENCOUNTER — Encounter (HOSPITAL_COMMUNITY): Payer: Medicare Other

## 2023-10-28 ENCOUNTER — Encounter (HOSPITAL_COMMUNITY): Payer: Medicare Other

## 2023-10-28 ENCOUNTER — Other Ambulatory Visit: Payer: Self-pay | Admitting: Family Medicine

## 2023-10-29 ENCOUNTER — Encounter (HOSPITAL_COMMUNITY)
Admission: RE | Admit: 2023-10-29 | Discharge: 2023-10-29 | Disposition: A | Discharge status: Home / Self Care | Payer: Medicare Other | Source: Ambulatory Visit | Attending: Cardiology | Admitting: Cardiology

## 2023-10-29 VITALS — Ht 58.25 in | Wt 145.1 lb

## 2023-10-29 DIAGNOSIS — I5022 Chronic systolic (congestive) heart failure: Secondary | ICD-10-CM | POA: Insufficient documentation

## 2023-10-29 NOTE — Patient Instructions (Signed)
 Patient Instructions  Patient Details  Name: Tara [name] MRN: [id number] Date of Birth: 1946-08-30 Referring Provider:  Rolan Ezra RAMAN, MD  Below are your personal goals for exercise, nutrition, and risk factors. Our goal is to help you stay on track towards obtaining and maintaining these goals. We will be discussing your progress on these goals with you throughout the program.  Initial Exercise Prescription:  Initial Exercise Prescription - 10/29/23 1400       Date of Initial Exercise RX and Referring Provider   Date 10/29/23    Referring Provider Rolan Ezra MD      Oxygen    Maintain Oxygen  Saturation 88% or higher      NuStep   Level 1    SPM 80    Minutes 15    METs 2      Track   Laps 10    Minutes 15    METs 2      Prescription Details   Frequency (times per week) 2    Duration Progress to 30 minutes of continuous aerobic without signs/symptoms of physical distress      Intensity   THRR 40-80% of Max Heartrate 93-126    Ratings of Perceived Exertion 11-13    Perceived Dyspnea 0-4      Progression   Progression Continue to progress workloads to maintain intensity without signs/symptoms of physical distress.      Resistance Training   Training Prescription Yes    Weight 2 lb    Reps 10-15             Exercise Goals: Frequency: Be able to perform aerobic exercise two to three times per week in program working toward 2-5 days per week of home exercise.  Intensity: Work with a perceived exertion of 11 (fairly light) - 15 (hard) while following your exercise prescription.  We will make changes to your prescription with you as you progress through the program.   Duration: Be able to do 30 to 45 minutes of continuous aerobic exercise in addition to a 5 minute warm-up and a 5 minute cool-down routine.   Nutrition Goals: Your personal nutrition goals will be established when you do your nutrition analysis with the dietician.  The following are  general nutrition guidelines to follow: Cholesterol < 200mg /day Sodium < 1500mg /day Fiber: Women over 50 yrs - 21 grams per day  Personal Goals:  Personal Goals and Risk Factors at Admission - 10/29/23 1503       Core Components/Risk Factors/Patient Goals on Admission    Weight Management Yes;Weight Loss;Obesity    Intervention Weight Management: Develop a combined nutrition and exercise program designed to reach desired caloric intake, while maintaining appropriate intake of nutrient and fiber, sodium and fats, and appropriate energy expenditure required for the weight goal.;Weight Management: Provide education and appropriate resources to help participant work on and attain dietary goals.;Weight Management/Obesity: Establish reasonable short term and long term weight goals.;Obesity: Provide education and appropriate resources to help participant work on and attain dietary goals.    Admit Weight 145 lb 1.6 oz (65.8 kg)    Goal Weight: Short Term 140 lb (63.5 kg)    Goal Weight: Long Term 135 lb (61.2 kg)    Expected Outcomes Long Term: Adherence to nutrition and physical activity/exercise program aimed toward attainment of established weight goal;Short Term: Continue to assess and modify interventions until short term weight is achieved;Weight Loss: Understanding of general recommendations for a balanced deficit meal plan, which promotes  1-2 lb weight loss per week and includes a negative energy balance of [phone] kcal/d;Understanding recommendations for meals to include 15-35% energy as protein, 25-35% energy from fat, 35-60% energy from carbohydrates, less than 200mg  of dietary cholesterol, 20-35 gm of total fiber daily;Understanding of distribution of calorie intake throughout the day with the consumption of 4-5 meals/snacks    Heart Failure Yes    Intervention Provide a combined exercise and nutrition program that is supplemented with education, support and counseling about heart failure.  Directed toward relieving symptoms such as shortness of breath, decreased exercise tolerance, and extremity edema.    Expected Outcomes Improve functional capacity of life;Short term: Attendance in program 2-3 days a week with increased exercise capacity. Reported lower sodium intake. Reported increased fruit and vegetable intake. Reports medication compliance.;Short term: Daily weights obtained and reported for increase. Utilizing diuretic protocols set by physician.;Long term: Adoption of self-care skills and reduction of barriers for early signs and symptoms recognition and intervention leading to self-care maintenance.    Hypertension Yes    Intervention Provide education on lifestyle modifcations including regular physical activity/exercise, weight management, moderate sodium restriction and increased consumption of fresh fruit, vegetables, and low fat dairy, alcohol moderation, and smoking cessation.;Monitor prescription use compliance.    Expected Outcomes Short Term: Continued assessment and intervention until BP is < 140/41mm HG in hypertensive participants. < 130/17mm HG in hypertensive participants with diabetes, heart failure or chronic kidney disease.;Long Term: Maintenance of blood pressure at goal levels.    Lipids Yes    Intervention Provide education and support for participant on nutrition & aerobic/resistive exercise along with prescribed medications to achieve LDL 70mg , HDL >40mg .    Expected Outcomes Long Term: Cholesterol controlled with medications as prescribed, with individualized exercise RX and with personalized nutrition plan. Value goals: LDL < 70mg , HDL > 40 mg.;Short Term: Participant states understanding of desired cholesterol values and is compliant with medications prescribed. Participant is following exercise prescription and nutrition guidelines.             Tobacco Use Initial Evaluation: Social History   Tobacco Use  Smoking Status Never  Smokeless Tobacco  Never    Exercise Goals and Review:  Exercise Goals     Row Name 10/29/23 1457             Exercise Goals   Increase Physical Activity Yes       Intervention Provide advice, education, support and counseling about physical activity/exercise needs.;Develop an individualized exercise prescription for aerobic and resistive training based on initial evaluation findings, risk stratification, comorbidities and participant's personal goals.       Expected Outcomes Short Term: Attend rehab on a regular basis to increase amount of physical activity.;Long Term: Add in home exercise to make exercise part of routine and to increase amount of physical activity.;Long Term: Exercising regularly at least 3-5 days a week.       Increase Strength and Stamina Yes       Intervention Provide advice, education, support and counseling about physical activity/exercise needs.;Develop an individualized exercise prescription for aerobic and resistive training based on initial evaluation findings, risk stratification, comorbidities and participant's personal goals.       Expected Outcomes Short Term: Increase workloads from initial exercise prescription for resistance, speed, and METs.;Short Term: Perform resistance training exercises routinely during rehab and add in resistance training at home;Long Term: Improve cardiorespiratory fitness, muscular endurance and strength as measured by increased METs and functional capacity ( )  Able to understand and use rate of perceived exertion (RPE) scale Yes       Intervention Provide education and explanation on how to use RPE scale       Expected Outcomes Short Term: Able to use RPE daily in rehab to express subjective intensity level;Long Term:  Able to use RPE to guide intensity level when exercising independently       Able to understand and use Dyspnea scale Yes       Intervention Provide education and explanation on how to use Dyspnea scale       Expected Outcomes  Short Term: Able to use Dyspnea scale daily in rehab to express subjective sense of shortness of breath during exertion;Long Term: Able to use Dyspnea scale to guide intensity level when exercising independently       Knowledge and understanding of Target Heart Rate Range (THRR) Yes       Intervention Provide education and explanation of THRR including how the numbers were predicted and where they are located for reference       Expected Outcomes Short Term: Able to state/look up THRR;Long Term: Able to use THRR to govern intensity when exercising independently;Short Term: Able to use daily as guideline for intensity in rehab       Able to check pulse independently Yes       Intervention Provide education and demonstration on how to check pulse in carotid and radial arteries.;Review the importance of being able to check your own pulse for safety during independent exercise       Expected Outcomes Short Term: Able to explain why pulse checking is important during independent exercise;Long Term: Able to check pulse independently and accurately       Understanding of Exercise Prescription Yes       Intervention Provide education, explanation, and written materials on patient's individual exercise prescription       Expected Outcomes Short Term: Able to explain program exercise prescription;Long Term: Able to explain home exercise prescription to exercise independently              Copy of goals given to participant.

## 2023-10-29 NOTE — Progress Notes (Signed)
 Cardiac Individual Treatment Plan  Patient Details  Name: Tara [name] MRN: [id number] Date of Birth: 1946-01-20 Referring Provider:   Flowsheet Row CARDIAC REHAB PHASE II ORIENTATION from 10/29/2023 in [facility] CARDIAC REHABILITATION  Referring Provider Rolan Barrack MD       Initial Encounter Date:  Flowsheet Row CARDIAC REHAB PHASE II ORIENTATION from 10/29/2023 in [place] [facility] CARDIAC REH[facility]ILIT[facility]  Date 10/29/23       Visit Diagnosis: Chronic systolic CHF (congestive heart failure) (HCC)  Patient's Home Medications on Admission:  Current Outpatient Medications:    acetaminophen  (TYLENOL ) 500 MG tablet, Place 500-1,000 mg into feeding tube every 6 (six) hours as needed for mild pain., Disp: , Rfl:    amiodarone  (PACERONE ) 200 MG tablet, Take 1 tablet (200 mg total) by mouth daily., Disp: 30 tablet, Rfl: 6   apixaban  (ELIQUIS ) 5 MG TABS tablet, Take 1 tablet (5 mg total) by mouth 2 (two) times daily., Disp: 60 tablet, Rfl: 6   bisacodyl  5 MG EC tablet, One tablet every 3 days, if needed, for constipation, Disp: 30 tablet, Rfl: 1   clonazePAM  (KLONOPIN ) 0.5 MG tablet, Take 1 tablet (0.5 mg total) by mouth at bedtime., Disp: 30 tablet, Rfl: 5   docusate sodium  (COLACE) 100 MG capsule, Take 1 capsule (100 mg total) by mouth 2 (two) times daily., Disp: , Rfl:    FARXIGA  10 MG TABS tablet, Take 1 tablet (10 mg total) by mouth daily before breakfast., Disp: 90 tablet, Rfl: 3   fluticasone  (FLONASE ) 50 MCG/ACT nasal spray, Place 2 sprays into both nostrils daily., Disp: 48 g, Rfl: 3   furosemide  (LASIX ) 20 MG tablet, Take 1 tablet (20 mg total) by mouth daily., Disp: 30 tablet, Rfl: 11   hydrocortisone  (ANUSOL -HC) 2.5 % rectal cream, Place 1 Application rectally 2 (two) times daily., Disp: 30 g, Rfl: 3   hydrocortisone  (ANUSOL -HC) 25 MG suppository, Place 1 suppository (25 mg total) rectally 2 (two) times daily., Disp: 12 suppository, Rfl: 3   lacosamide  (VIMPAT ) 50 MG TABS tablet, Take  1 tablet (50 mg total) by mouth 2 (two) times daily., Disp: 180 tablet, Rfl: 2   levETIRAcetam  (KEPPRA ) 500 MG tablet, Take 1 tablet (500 mg total) by mouth 2 (two) times daily., Disp: 180 tablet, Rfl: 3   montelukast  (SINGULAIR ) 10 MG tablet, Take 1 tablet (10 mg total) by mouth at bedtime., Disp: 90 tablet, Rfl: 3   pantoprazole  (PROTONIX ) 40 MG tablet, TAKE (1) TABLET BY MOUTH TWICE DAILY., Disp: 60 tablet, Rfl: 0   polyethylene glycol (MIRALAX ) 17 g packet, Take one packet mira lax once daily in 8 ounces water , Disp: 28 each, Rfl: 5   potassium chloride  SA (KLOR-CON  M) 20 MEQ tablet, Take 1 tablet (20 mEq total) by mouth daily., Disp: 30 tablet, Rfl: 11   sertraline  (ZOLOFT ) 25 MG tablet, TAKE 1 TABLET BY MOUTH ONCE A DAY., Disp: 30 tablet, Rfl: 0  Past Medical History: 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   Angio-edema    Anxiety    Breast cancer (HCC) 2008   right - s/p lumpectomy->chemo, radiation   Depression    Dysrhythmia    hx SVT   Family history of colon cancer    Family history of prostate cancer    GERD (gastroesophageal reflux disease)    H/O: hysterectomy    Hiatal hernia    History of cancer chemotherapy    History  of radiation therapy    Hyperlipidemia    Hypertension 01/25/2018   Nonischemic cardiomyopathy (HCC)    Personal history of radiation therapy 01/05/209   SVT (supraventricular tachycardia) (HCC)    short RP SVT documented 5/14   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HCC)    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)   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    Tobacco Use: Social History   Tobacco Use  Smoking Status Never  Smokeless Tobacco Never    Labs: Review Flowsheet  More data exists      Latest Ref Rng & Units 03/24/2022 04/18/2022 12/31/2022 06/03/2023 06/24/2023  Labs for ITP Cardiac and Pulmonary Rehab  Cholestrol  100 - 199 mg/dL - 73  821  - -  LDL (calc) 0 - 99 mg/dL - 38  94  - -  HDL-C >60 mg/dL - 18  69  - -  Trlycerides 0 - 149 mg/dL - 85  81  - -  Hemoglobin A1c 4.8 - 5.6 % - 5.9  6.1  6.2  -  PH, Arterial 7.35 - 7.45 7.359  7.381  - - - -  PCO2 arterial 32 - 48 mmHg 40.6  39.2  - - - -  Bicarbonate 20.0 - 28.0 mmol/L 23.4  23.8  - - - -  TCO2 22 - 32 mmol/L 25  25  - - - 18   Acid-base deficit 0.0 - 2.0 mmol/L 3.0  2.0  - - - -  O2 Saturation % 100  100  - - - -    Details       Multiple values from one day are sorted in reverse-chronological order         Capillary Blood Glucose: Lab Results  Component Value Date   GLUCAP 86 06/22/2023   GLUCAP 91 06/21/2023   GLUCAP 78 09/22/2022   GLUCAP 91 09/22/2022   GLUCAP 114 (H) 09/22/2022     Exercise Target Goals: Exercise Program Goal: Individual exercise prescription set using results from initial 6 min walk test and THRR while considering  patient's activity barriers and safety.   Exercise Prescription Goal: Starting with aerobic activity 30 plus minutes a day, 3 days per week for initial exercise prescription. Provide home exercise prescription and guidelines that participant acknowledges understanding prior to discharge.  Activity Barriers & Risk Stratification:  Activity Barriers & Cardiac Risk Stratification - 10/29/23 1315       Activity Barriers & Cardiac Risk Stratification   Activity Barriers Left Knee Replacement;Deconditioning;Shortness of Breath;Balance Concerns;Muscular Weakness    Cardiac Risk Stratification Moderate             6 Minute Walk:  6 Minute Walk     Row Name 10/29/23 1450         6 Minute Walk   Phase Initial     Distance 720 feet     Walk Time 6 minutes     # of Rest Breaks 0     MPH 1.36     METS 1.55     RPE 9     VO2 Peak 5.43     Symptoms No     Resting HR 59 bpm     Resting BP 106/54     Resting Oxygen  Saturation  95 %     Exercise Oxygen  Saturation  during 6 min walk  88 %  Max Ex. HR 119 bpm     Max Ex. BP 148/74     2 Minute Post BP 106/62              Oxygen  Initial Assessment:   Oxygen  Re-Evaluation:   Oxygen  Discharge (Final Oxygen  Re-Evaluation):   Initial Exercise Prescription:  Initial Exercise Prescription - 10/29/23 1400       Date of Initial Exercise RX and Referring Provider   Date 10/29/23    Referring Provider Rolan Barrack MD      Oxygen    Maintain Oxygen  Saturation 88% or higher      NuStep   Level 1    SPM 80    Minutes 15    METs 2      Track   Laps 10    Minutes 15    METs 2      Prescription Details   Frequency (times per week) 2    Duration Progress to 30 minutes of continuous aerobic without signs/symptoms of physical distress      Intensity   THRR 40-80% of Max Heartrate 93-126    Ratings of Perceived Exertion 11-13    Perceived Dyspnea 0-4      Progression   Progression Continue to progress workloads to maintain intensity without signs/symptoms of physical distress.      Resistance Training   Training Prescription Yes    Weight 2 lb    Reps 10-15             Perform Capillary Blood Glucose checks as needed.  Exercise Prescription Changes:   Exercise Prescription Changes     Row Name 10/29/23 1400             Response to Exercise   Blood Pressure (Admit) 106/54       Blood Pressure (Exercise) 148/74       Blood Pressure (Exit) 106/62       Heart Rate (Admit) 59 bpm       Heart Rate (Exercise) 119 bpm       Heart Rate (Exit) 64 bpm       Oxygen  Saturation (Admit) 95 %       Oxygen  Saturation (Exercise) 88 %       Rating of Perceived Exertion (Exercise) 9       Symptoms none       Comments walk test results                Exercise Comments:   Exercise Comments     Row Name 10/29/23 1507           Exercise Comments Jaunice restarted rehab today.  She was able to complete versus Nustep Test.                Exercise Goals and Review:    Exercise Goals     Row Name 10/29/23 1457             Exercise Goals   Increase Physical Activity Yes       Intervention Provide advice, education, support and counseling about physical activity/exercise needs.;Develop an individualized exercise prescription for aerobic and resistive training based on initial evaluation findings, risk stratification, comorbidities and participant's personal goals.       Expected Outcomes Short Term: Attend rehab on a regular basis to increase amount of physical activity.;Long Term: Add in home exercise to make exercise part of routine and to increase amount of physical activity.;Long Term: Exercising regularly at least  3-5 days a week.       Increase Strength and Stamina Yes       Intervention Provide advice, education, support and counseling about physical activity/exercise needs.;Develop an individualized exercise prescription for aerobic and resistive training based on initial evaluation findings, risk stratification, comorbidities and participant's personal goals.       Expected Outcomes Short Term: Increase workloads from initial exercise prescription for resistance, speed, and METs.;Short Term: Perform resistance training exercises routinely during rehab and add in resistance training at home;Long Term: Improve cardiorespiratory fitness, muscular endurance and strength as measured by increased METs and functional capacity ( )       Able to understand and use rate of perceived exertion (RPE) scale Yes       Intervention Provide education and explanation on how to use RPE scale       Expected Outcomes Short Term: Able to use RPE daily in rehab to express subjective intensity level;Long Term:  Able to use RPE to guide intensity level when exercising independently       Able to understand and use Dyspnea scale Yes       Intervention Provide education and explanation on how to use Dyspnea scale       Expected Outcomes Short Term: Able to use Dyspnea scale  daily in rehab to express subjective sense of shortness of breath during exertion;Long Term: Able to use Dyspnea scale to guide intensity level when exercising independently       Knowledge and understanding of Target Heart Rate Range (THRR) Yes       Intervention Provide education and explanation of THRR including how the numbers were predicted and where they are located for reference       Expected Outcomes Short Term: Able to state/look up THRR;Long Term: Able to use THRR to govern intensity when exercising independently;Short Term: Able to use daily as guideline for intensity in rehab       Able to check pulse independently Yes       Intervention Provide education and demonstration on how to check pulse in carotid and radial arteries.;Review the importance of being able to check your own pulse for safety during independent exercise       Expected Outcomes Short Term: Able to explain why pulse checking is important during independent exercise;Long Term: Able to check pulse independently and accurately       Understanding of Exercise Prescription Yes       Intervention Provide education, explanation, and written materials on patient's individual exercise prescription       Expected Outcomes Short Term: Able to explain program exercise prescription;Long Term: Able to explain home exercise prescription to exercise independently                Exercise Goals Re-Evaluation :    Discharge Exercise Prescription (Final Exercise Prescription Changes):  Exercise Prescription Changes - 10/29/23 1400       Response to Exercise   Blood Pressure (Admit) 106/54    Blood Pressure (Exercise) 148/74    Blood Pressure (Exit) 106/62    Heart Rate (Admit) 59 bpm    Heart Rate (Exercise) 119 bpm    Heart Rate (Exit) 64 bpm    Oxygen  Saturation (Admit) 95 %    Oxygen  Saturation (Exercise) 88 %    Rating of Perceived Exertion (Exercise) 9    Symptoms none    Comments walk test results              Nutrition:  Target Goals: Understanding of nutrition guidelines, daily intake of sodium 1500mg , cholesterol 200mg , calories 30% from fat and 7% or less from saturated fats, daily to have 5 or more servings of fruits and vegetables.  Biometrics:  Pre Biometrics - 10/29/23 1458       Pre Biometrics   Height 4' 10.25 (1.48 m)    Weight 65.8 kg    Waist Circumference 32 inches    Hip Circumference 40 inches    Waist to Hip Ratio 0.8 %    BMI (Calculated) 30.05    Grip Strength 22 kg    Single Leg Stand 0 seconds              Nutrition Therapy Plan and Nutrition Goals:  Nutrition Therapy & Goals - 10/29/23 1319       Intervention Plan   Intervention Prescribe, educate and counsel regarding individualized specific dietary modifications aiming towards targeted core components such as weight, hypertension, lipid management, diabetes, heart failure and other comorbidities.;Nutrition handout(s) given to patient.    Expected Outcomes Short Term Goal: Understand basic principles of dietary content, such as calories, fat, sodium, cholesterol and nutrients.;Long Term Goal: Adherence to prescribed nutrition plan.             Nutrition Assessments:  MEDIFICTS Score Key: >=70 Need to make dietary changes  40-70 Heart Healthy Diet <= 40 Therapeutic Level Cholesterol Diet  Flowsheet Row CARDIAC REHAB PHASE II ORIENTATION from 10/29/2023 in [facility]AC [facility]  Picture Your Plate Total Score on Admission 73      Picture Your Plate Scores: <59 Unhealthy dietary pattern with much room for improvement. 41-50 Dietary pattern unlikely to meet recommendations for good health and room for improvement. 51-60 More healthful dietary pattern, with some room for improvement.  >60 Healthy dietary pattern, although there may be some specific behaviors that could be improved.    Nutrition Goals Re-Evaluation:   Nutrition Goals Discharge (Final Nutrition Goals  Re-Evaluation):   Psychosocial: Target Goals: Acknowledge presence or absence of significant depression and/or stress, maximize coping skills, provide positive support system. Participant is able to verbalize types and ability to use techniques and skills needed for reducing stress and depression.  Initial Review & Psychosocial Screening:  Initial Psych Review & Screening - 10/29/23 1318       Initial Review   Current issues with None Identified      Family Dynamics   Good Support System? Yes   husband, daughter, care takers     Barriers   Psychosocial barriers to participate in program The patient should benefit from training in stress management and relaxation.;There are no identifiable barriers or psychosocial needs.      Screening Interventions   Interventions Encouraged to exercise;To provide support and resources with identified psychosocial needs;Provide feedback about the scores to participant    Expected Outcomes Short Term goal: Utilizing psychosocial counselor, staff and physician to assist with identification of specific Stressors or current issues interfering with healing process. Setting desired goal for each stressor or current issue identified.;Long Term Goal: Stressors or current issues are controlled or eliminated.;Short Term goal: Identification and review with participant of any Quality of Life or Depression concerns found by scoring the questionnaire.;Long Term goal: The participant improves quality of Life and PHQ9 Scores as seen by post scores and/or verbalization of changes             Quality of Life Scores:  Quality of Life - 10/29/23 1502  Quality of Life   Select Quality of Life      Quality of Life Scores   Health/Function Pre 25.2 %    Socioeconomic Pre 30 %    Psych/Spiritual Pre 23.14 %    Family Pre 30 %    GLOBAL Pre 26.57 %            Scores of 19 and below usually indicate a poorer quality of life in these areas.  A difference  of  2-3 points is a clinically meaningful difference.  A difference of 2-3 points in the total score of the Quality of Life Index has been associated with significant improvement in overall quality of life, self-image, physical symptoms, and general health in studies assessing change in quality of life.  PHQ-9: Review Flowsheet  More data exists      10/29/2023 08/17/2023 08/11/2023 08/05/2023 07/22/2023  Depression screen PHQ 2/9  Decreased Interest 2 1 0 0 0 0  Down, Depressed, Hopeless 0 0 0 0 0 0  PHQ - 2 Score 2 1 0 0 0 0  Altered sleeping 1 0 - - -  Tired, decreased energy 1 1 - - -  Change in appetite 1 1 - - -  Feeling bad or failure about yourself  1 0 - - -  Trouble concentrating 0 0 - - -  Moving slowly or fidgety/restless 1 0 - - -  Suicidal thoughts 0 0 - - -  PHQ-9 Score 7 3 - - -  Difficult doing work/chores Very difficult Somewhat difficult - - -    Details       Multiple values from one day are sorted in reverse-chronological order        Interpretation of Total Score  Total Score Depression Severity:  1-4 = Minimal depression, 5-9 = Mild depression, 10-14 = Moderate depression, 15-19 = Moderately severe depression, 20-27 = Severe depression   Psychosocial Evaluation and Intervention:  Psychosocial Evaluation - 10/29/23 1458       Psychosocial Evaluation & Interventions   Interventions Stress management education;Relaxation education;Encouraged to exercise with the program and follow exercise prescription    Comments Phylicia is returning to cardiac rehab to finish program.  She called to stop previously due to not feeling well and husband was recovering from surgery.  She has paid caregivers at home that help with appts and documentations.  She denied any depression symptoms or major stressors.  Her caretaker will be bringing her to classes each day and they do not percieve any barriers to attending.  She is walking with gait belt with family and caregiver but no  longer in wheelchair and using walker some at home.  She did her walk test today using a rollator walker for support.  Her knee and foot have been less painful recently and she wants to walk more.  Her goal is to be able to walk unassisted.  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  She sleeps well and her family keep her moving more at home.    Expected Outcomes Short: Attend program to build up stamina Long: Conitnue to work on walking and independence    Continue Psychosocial Services  Follow up required by staff             Psychosocial Re-Evaluation:   Psychosocial Discharge (Final Psychosocial Re-Evaluation):   Vocational Rehabilitation: Provide vocational rehab assistance to qualifying candidates.   Vocational Rehab Evaluation & Intervention:  Vocational Rehab - 10/29/23  1319       Initial Vocational Rehab Evaluation & Intervention   Assessment shows need for Vocational Rehabilitation No   retired            Education: Education Goals: Education classes will be provided on a weekly basis, covering required topics. Participant will state understanding/return demonstration of topics presented.  Learning Barriers/Preferences:  Learning Barriers/Preferences - 10/29/23 1314       Learning Barriers/Preferences   Learning Barriers Sight   glasses for reading   Learning Preferences None             Education Topics: Hypertension, Hypertension Reduction -Define heart disease and high blood pressure. Discus how high blood pressure affects the body and ways to reduce high blood pressure.   Exercise and Your Heart -Discuss why it is important to exercise, the FITT principles of exercise, normal and abnormal responses to exercise, and how to exercise safely.   Angina -Discuss definition of angina, causes of angina, treatment of angina, and how to decrease risk of having angina.   Cardiac Medications -Review what the following cardiac  medications are used for, how they affect the body, and side effects that may occur when taking the medications.  Medications include Aspirin , Beta blockers, calcium  channel blockers, ACE Inhibitors, angiotensin receptor blockers, diuretics, digoxin, and antihyperlipidemics.   Congestive Heart Failure -Discuss the definition of CHF, how to live with CHF, the signs and symptoms of CHF, and how keep track of weight and sodium intake.   Heart Disease and Intimacy -Discus the effect sexual activity has on the heart, how changes occur during intimacy as we age, and safety during sexual activity. Flowsheet Row CARDIAC REHAB PHASE II EXERCISE from 08/26/2023 in [place] [facility] CARDIAC REHABILITATION  Date 08/26/23  Educator jh  Instruction Review Code 1- Verbalizes Understanding       Smoking Cessation / COPD -Discuss different methods to quit smoking, the health benefits of quitting smoking, and the definition of COPD.   Nutrition I: Fats -Discuss the types of cholesterol, what cholesterol does to the heart, and how cholesterol levels can be controlled.   Nutrition II: Labels -Discuss the different components of food labels and how to read food label   Heart Parts/Heart Disease and PAD -Discuss the anatomy of the heart, the pathway of blood circulation through the heart, and these are affected by heart disease.   Stress I: Signs and Symptoms -Discuss the causes of stress, how stress may lead to anxiety and depression, and ways to limit stress.   Stress II: Relaxation -Discuss different types of relaxation techniques to limit stress.   Warning Signs of Stroke / TIA -Discuss definition of a stroke, what the signs and symptoms are of a stroke, and how to identify when someone is having stroke.   Knowledge Questionnaire Score:  Knowledge Questionnaire Score - 10/29/23 1503       Knowledge Questionnaire Score   Pre Score 18/24             Core Components/Risk Factors/Patient  Goals at Admission:  Personal Goals and Risk Factors at Admission - 10/29/23 1503       Core Components/Risk Factors/Patient Goals on Admission    Weight Management Yes;Weight Loss;Obesity    Intervention Weight Management: Develop a combined nutrition and exercise program designed to reach desired caloric intake, while maintaining appropriate intake of nutrient and fiber, sodium and fats, and appropriate energy expenditure required for the weight goal.;Weight Management: Provide education and appropriate resources to help  participant work on and attain dietary goals.;Weight Management/Obesity: Establish reasonable short term and long term weight goals.;Obesity: Provide education and appropriate resources to help participant work on and attain dietary goals.    Admit Weight 145 lb 1.6 oz (65.8 kg)    Goal Weight: Short Term 140 lb (63.5 kg)    Goal Weight: Long Term 135 lb (61.2 kg)    Expected Outcomes Long Term: Adherence to nutrition and physical activity/exercise program aimed toward attainment of established weight goal;Short Term: Continue to assess and modify interventions until short term weight is achieved;Weight Loss: Understanding of general recommendations for a balanced deficit meal plan, which promotes 1-2 lb weight loss per week and includes a negative energy balance of [phone] kcal/d;Understanding recommendations for meals to include 15-35% energy as protein, 25-35% energy from fat, 35-60% energy from carbohydrates, less than 200mg  of dietary cholesterol, 20-35 gm of total fiber daily;Understanding of distribution of calorie intake throughout the day with the consumption of 4-5 meals/snacks    Heart Failure Yes    Intervention Provide a combined exercise and nutrition program that is supplemented with education, support and counseling about heart failure. Directed toward relieving symptoms such as shortness of breath, decreased exercise tolerance, and extremity edema.    Expected  Outcomes Improve functional capacity of life;Short term: Attendance in program 2-3 days a week with increased exercise capacity. Reported lower sodium intake. Reported increased fruit and vegetable intake. Reports medication compliance.;Short term: Daily weights obtained and reported for increase. Utilizing diuretic protocols set by physician.;Long term: Adoption of self-care skills and reduction of barriers for early signs and symptoms recognition and intervention leading to self-care maintenance.    Hypertension Yes    Intervention Provide education on lifestyle modifcations including regular physical activity/exercise, weight management, moderate sodium restriction and increased consumption of fresh fruit, vegetables, and low fat dairy, alcohol moderation, and smoking cessation.;Monitor prescription use compliance.    Expected Outcomes Short Term: Continued assessment and intervention until BP is < 140/11mm HG in hypertensive participants. < 130/53mm HG in hypertensive participants with diabetes, heart failure or chronic kidney disease.;Long Term: Maintenance of blood pressure at goal levels.    Lipids Yes    Intervention Provide education and support for participant on nutrition & aerobic/resistive exercise along with prescribed medications to achieve LDL 70mg , HDL >40mg .    Expected Outcomes Long Term: Cholesterol controlled with medications as prescribed, with individualized exercise RX and with personalized nutrition plan. Value goals: LDL < 70mg , HDL > 40 mg.;Short Term: Participant states understanding of desired cholesterol values and is compliant with medications prescribed. Participant is following exercise prescription and nutrition guidelines.             Core Components/Risk Factors/Patient Goals Review:    Core Components/Risk Factors/Patient Goals at Discharge (Final Review):    ITP Comments:  ITP Comments     Row Name 09/02/23 1439 10/29/23 1445         ITP Comments  Patient called to let us  know that she was not feeling well and wanted to discharge from the program at this time.  Discharge ITP created and sent to Medical Director. Patient attend orientation today.  Patient is attending Cardiac Rehabilitation Program.  Documentation for diagnosis can be found in [facility] 09/04/23.  Reviewed medical chart, RPE/RPD, gym safety, and program guidelines.  Patient was fitted to equipment they will be using during rehab.  Patient is scheduled to start exercise on Monday 11/02/23 at 1430.   Initial ITP created and  sent for review and signature by Dr. Dorn Ross, Medical Director for Cardiac Rehabilitation Program.  Pt is returning to rehab from previous sessions that she had cancelled in December.  Today, is session 4 and she will finish her 36 sessions.               Comments: Initial ITP (restarting)

## 2023-11-02 ENCOUNTER — Ambulatory Visit: Payer: Medicare Other | Admitting: Nutrition

## 2023-11-02 ENCOUNTER — Encounter (HOSPITAL_COMMUNITY)
Admission: RE | Admit: 2023-11-02 | Discharge: 2023-11-02 | Disposition: A | Discharge status: Home / Self Care | Payer: Medicare Other | Source: Ambulatory Visit | Attending: Cardiology

## 2023-11-02 ENCOUNTER — Encounter (HOSPITAL_COMMUNITY): Payer: Medicare Other

## 2023-11-02 DIAGNOSIS — I5022 Chronic systolic (congestive) heart failure: Secondary | ICD-10-CM | POA: Diagnosis not present

## 2023-11-02 NOTE — Progress Notes (Signed)
 Daily Session Note  Patient Details  Name: Tara [name] MRN: [id number] Date of Birth: 05-30-1946 Referring Provider:   Flowsheet Row CARDIAC REHAB PHASE II ORIENTATION from 10/29/2023 in [facility] CARDIAC REHABILITATION  Referring Provider Peder Bourdon MD       Encounter Date: 11/02/2023  Check In:  Session Check In - 11/02/23 1415       Check-In   Supervising physician immediately available to respond to emergencies See telemetry face sheet for immediately available MD    Location [facility]    Staff Present Clotilda Danish, BS, Exercise Physiologist    Medication changes reported     No    Fall or balance concerns reported    No    Tobacco Cessation No Change    Warm-up and Cool-down Performed on first and last piece of equipment    Resistance Training Performed Yes    VAD Patient? No    PAD/SET Patient? No      Pain Assessment   Currently in Pain? No/denies    Multiple Pain Sites No             Capillary Blood Glucose: No results found. However, due to the size of the patient record, not all encounters were searched. Please check Results Review for a complete set of results.    Social History   Tobacco Use  Smoking Status Never  Smokeless Tobacco Never    Goals Met:  Independence with exercise equipment Exercise tolerated well No report of concerns or symptoms today Strength training completed today  Goals Unmet:  Not Applicable  Comments: First full day of exercise!  Patient was oriented to gym and equipment including functions, settings, policies, and procedures.  Patient's individual exercise prescription and treatment plan were reviewed.  All starting workloads were established based on the results of the 6 minute walk test done at initial orientation visit.  The plan for exercise progression was also introduced and progression will be customized based on patient's performance and goals.

## 2023-11-04 ENCOUNTER — Encounter (HOSPITAL_COMMUNITY)
Admission: RE | Admit: 2023-11-04 | Discharge: 2023-11-04 | Disposition: A | Discharge status: Home / Self Care | Payer: Medicare Other | Source: Ambulatory Visit | Attending: Cardiology | Admitting: Cardiology

## 2023-11-04 ENCOUNTER — Encounter (HOSPITAL_COMMUNITY): Payer: Medicare Other

## 2023-11-04 DIAGNOSIS — I5022 Chronic systolic (congestive) heart failure: Secondary | ICD-10-CM

## 2023-11-04 NOTE — Progress Notes (Signed)
Daily Session Note  Patient Details  Name: Tara [name] MRN: [id number] Date of Birth: 28-Dec-1945 Referring Provider:   Flowsheet Row CARDIAC REHAB PHASE II ORIENTATION from 10/29/2023 in [facility] CARDIAC REHABILITATION  Referring Provider Tara Ancona MD       Encounter Date: 11/04/2023  Check In:  Session Check In - 11/04/23 1400       Check-In   Supervising physician immediately available to respond to emergencies See telemetry face sheet for immediately available MD    Location [facility]    Staff Present Avanell Shackleton BSN, RN;Heather Fredric Mare, [place], Exercise Physiologist    Virtual Visit No    Medication changes reported     No    Fall or balance concerns reported    No    Tobacco Cessation No Change    Warm-up and Cool-down Performed on first and last piece of equipment    Resistance Training Performed Yes    VAD Patient? No    PAD/SET Patient? No      Pain Assessment   Currently in Pain? No/denies    Multiple Pain Sites No             Capillary Blood Glucose: No results found. However, due to the size of the patient record, not all encounters were searched. Please check Results Review for a complete set of results.    Social History   Tobacco Use  Smoking Status Never  Smokeless Tobacco Never    Goals Met:  Independence with exercise equipment Exercise tolerated well No report of concerns or symptoms today Strength training completed today  Goals Unmet:  Not Applicable  Comments: Marland KitchenMarland KitchenPt able to follow exercise prescription today without complaint.  Will continue to monitor for progression.

## 2023-11-05 ENCOUNTER — Telehealth: Payer: Self-pay | Admitting: Family Medicine

## 2023-11-05 NOTE — Telephone Encounter (Signed)
 Copied from CRM [id number]. Topic: General - Other >> Nov 04, 2023  4:29 PM Mosetta Putt H wrote: Reason for CRM: mail completed paperwork, mailing address [phone] [place] [address] addressed to [employer]

## 2023-11-09 ENCOUNTER — Encounter (HOSPITAL_COMMUNITY)
Admission: RE | Admit: 2023-11-09 | Discharge: 2023-11-09 | Disposition: A | Discharge status: Home / Self Care | Payer: Medicare Other | Source: Ambulatory Visit | Attending: Cardiology | Admitting: Cardiology

## 2023-11-09 ENCOUNTER — Encounter (HOSPITAL_COMMUNITY): Payer: Medicare Other

## 2023-11-09 DIAGNOSIS — I5022 Chronic systolic (congestive) heart failure: Secondary | ICD-10-CM

## 2023-11-09 NOTE — Progress Notes (Signed)
Daily Session Note  Patient Details  Name: Tara [name] MRN: [id number] Date of Birth: Apr 12, 1946 Referring Provider:   Flowsheet Row CARDIAC REHAB PHASE II ORIENTATION from 10/29/2023 in [facility] CARDIAC REHABILITATION  Referring Provider [name] MD       Encounter Date: 11/09/2023  Check In:  Session Check In - 11/09/23 1430       Check-In   Supervising physician immediately available to respond to emergencies See telemetry face sheet for immediately available MD    Location [facility]    Staff Present Hulen Luster, BS, RRT, CPFT;Santiago Stenzel, RN;Debra Laural Benes, RN, Thomos Lemons, MA, RCEP, CCRP, CCET    Virtual Visit No    Medication changes reported     No    Fall or balance concerns reported    No    Warm-up and Cool-down Performed on first and last piece of equipment    Resistance Training Performed Yes    VAD Patient? No    PAD/SET Patient? No      Pain Assessment   Currently in Pain? No/denies    Multiple Pain Sites No             Capillary Blood Glucose: No results found. However, due to the size of the patient record, not all encounters were searched. Please check Results Review for a complete set of results.    Social History   Tobacco Use  Smoking Status Never  Smokeless Tobacco Never    Goals Met:  Independence with exercise equipment Exercise tolerated well No report of concerns or symptoms today Strength training completed today  Goals Unmet:  Not Applicable  Comments: Pt able to follow exercise prescription today without complaint.  Will continue to monitor for progression.

## 2023-11-10 DIAGNOSIS — I10 Essential (primary) hypertension: Secondary | ICD-10-CM | POA: Diagnosis not present

## 2023-11-10 DIAGNOSIS — D631 Anemia in chronic kidney disease: Secondary | ICD-10-CM | POA: Diagnosis not present

## 2023-11-10 DIAGNOSIS — N184 Chronic kidney disease, stage 4 (severe): Secondary | ICD-10-CM | POA: Diagnosis not present

## 2023-11-10 NOTE — Telephone Encounter (Signed)
Forms were picked up by [facility]

## 2023-11-11 ENCOUNTER — Encounter (HOSPITAL_COMMUNITY): Payer: Medicare Other

## 2023-11-13 ENCOUNTER — Encounter (HOSPITAL_COMMUNITY)
Admission: RE | Admit: 2023-11-13 | Payer: Medicare Other | Source: Ambulatory Visit | Attending: Cardiology | Admitting: Cardiology

## 2023-11-13 ENCOUNTER — Encounter (HOSPITAL_COMMUNITY)
Admission: RE | Admit: 2023-11-13 | Discharge: 2023-11-13 | Disposition: A | Discharge status: Home / Self Care | Payer: Medicare Other | Source: Ambulatory Visit | Attending: Cardiology | Admitting: Cardiology

## 2023-11-13 DIAGNOSIS — I5022 Chronic systolic (congestive) heart failure: Secondary | ICD-10-CM | POA: Diagnosis not present

## 2023-11-13 NOTE — Progress Notes (Signed)
Daily Session Note  Patient Details  Name: Tara [name] MRN: [id number] Date of Birth: April 11, 1946 Referring Provider:   Flowsheet Row CARDIAC REHAB PHASE II ORIENTATION from 10/29/2023 in [facility] CARDIAC REHABILITATION  Referring Provider [name] MD       Encounter Date: 11/13/2023  Check In:  Session Check In - 11/13/23 1430       Check-In   Supervising physician immediately available to respond to emergencies See telemetry face sheet for immediately available MD    Location [facility]    Staff Present Ross Ludwig, BS, Exercise Physiologist    Virtual Visit No    Medication changes reported     No    Fall or balance concerns reported    No    Tobacco Cessation No Change    Warm-up and Cool-down Performed on first and last piece of equipment    Resistance Training Performed Yes    VAD Patient? No    PAD/SET Patient? No      Pain Assessment   Currently in Pain? No/denies    Multiple Pain Sites No             Capillary Blood Glucose: No results found. However, due to the size of the patient record, not all encounters were searched. Please check Results Review for a complete set of results.    Social History   Tobacco Use  Smoking Status Never  Smokeless Tobacco Never    Goals Met:  Independence with exercise equipment Exercise tolerated well No report of concerns or symptoms today Strength training completed today  Goals Unmet:  Not Applicable  Comments: Pt able to follow exercise prescription today without complaint.  Will continue to monitor for progression.

## 2023-11-16 ENCOUNTER — Encounter (HOSPITAL_COMMUNITY): Payer: Medicare Other

## 2023-11-16 ENCOUNTER — Encounter (HOSPITAL_COMMUNITY)
Admission: RE | Admit: 2023-11-16 | Discharge: 2023-11-16 | Disposition: A | Discharge status: Home / Self Care | Payer: Medicare Other | Source: Ambulatory Visit | Attending: Cardiology | Admitting: Cardiology

## 2023-11-16 DIAGNOSIS — I5022 Chronic systolic (congestive) heart failure: Secondary | ICD-10-CM

## 2023-11-16 NOTE — Progress Notes (Signed)
 Daily Session Note  Patient Details  Name: DAEJA HELDERMAN MRN: [id number] Date of Birth: 01/15/1946 Referring Provider:   Flowsheet Row CARDIAC REHAB PHASE II ORIENTATION from 10/29/2023 in [facility] CARDIAC REHABILITATION  Referring Provider [name] MD       Encounter Date: 11/16/2023  Check In:  Session Check In - 11/16/23 1420       Check-In   Supervising physician immediately available to respond to emergencies See telemetry face sheet for immediately available ER MD    Location [facility]    Staff Present Ross Ludwig, BS, Exercise Physiologist;Meshach Perry Laural Benes, RN, BSN    Virtual Visit No    Medication changes reported     No    Fall or balance concerns reported    No    Warm-up and Cool-down Performed on first and last piece of equipment    Resistance Training Performed Yes    VAD Patient? No    PAD/SET Patient? No      Pain Assessment   Currently in Pain? No/denies    Multiple Pain Sites No             Capillary Blood Glucose: No results found. However, due to the size of the patient record, not all encounters were searched. Please check Results Review for a complete set of results.    Social History   Tobacco Use  Smoking Status Never  Smokeless Tobacco Never    Goals Met:  Independence with exercise equipment Exercise tolerated well No report of concerns or symptoms today Strength training completed today  Goals Unmet:  Not Applicable  Comments: Pt able to follow exercise prescription today without complaint.  Will continue to monitor for progression.

## 2023-11-18 ENCOUNTER — Encounter (HOSPITAL_COMMUNITY): Payer: Self-pay | Admitting: *Deleted

## 2023-11-18 ENCOUNTER — Encounter (HOSPITAL_COMMUNITY)
Admission: RE | Admit: 2023-11-18 | Discharge: 2023-11-18 | Disposition: A | Discharge status: Home / Self Care | Payer: Medicare Other | Source: Ambulatory Visit | Attending: Cardiology | Admitting: Cardiology

## 2023-11-18 ENCOUNTER — Encounter (HOSPITAL_COMMUNITY): Payer: Medicare Other

## 2023-11-18 DIAGNOSIS — I5022 Chronic systolic (congestive) heart failure: Secondary | ICD-10-CM

## 2023-11-18 NOTE — Progress Notes (Signed)
 Daily Session Note  Patient Details  Name: Tara [name] MRN: [id number] Date of Birth: 01-Mar-1946 Referring Provider:   Flowsheet Row CARDIAC REHAB PHASE II ORIENTATION from 10/29/2023 in [facility] CARDIAC REHABILITATION  Referring Provider [name] MD       Encounter Date: 11/18/2023  Check In:  Session Check In - 11/18/23 1430       Check-In   Supervising physician immediately available to respond to emergencies See telemetry face sheet for immediately available MD    Location [facility]    Staff Present Ross Ludwig, BS, Exercise Physiologist;Hillary Leonidas Romberg BSN, RN    Virtual Visit No    Medication changes reported     No    Fall or balance concerns reported    No    Tobacco Cessation No Change    Warm-up and Cool-down Performed on first and last piece of equipment    Resistance Training Performed Yes    VAD Patient? No    PAD/SET Patient? No      Pain Assessment   Currently in Pain? No/denies    Multiple Pain Sites No             Capillary Blood Glucose: No results found. However, due to the size of the patient record, not all encounters were searched. Please check Results Review for a complete set of results.    Social History   Tobacco Use  Smoking Status Never  Smokeless Tobacco Never    Goals Met:  Independence with exercise equipment Exercise tolerated well No report of concerns or symptoms today Strength training completed today  Goals Unmet:  Not Applicable  Comments: Pt able to follow exercise prescription today without complaint.  Will continue to monitor for progression.

## 2023-11-18 NOTE — Progress Notes (Signed)
 Cardiac Individual Treatment Plan  Patient Details  Name: Tara [name] MRN: [id number] Date of Birth: 1946/06/28 Referring Provider:   Flowsheet Row CARDIAC REHAB PHASE II ORIENTATION from 10/29/2023 in [facility] CARDIAC REHABILITATION  Referring Provider [name] MD       Initial Encounter Date:  Flowsheet Row CARDIAC REHAB PHASE II ORIENTATION from 10/29/2023 in [place] CARDIAC REHABILITATION  Date 10/29/23       Visit Diagnosis: Chronic systolic CHF (congestive heart failure) (HCC)  Patient's Home Medications on Admission:  Current Outpatient Medications:    acetaminophen (TYLENOL) 500 MG tablet, Place 500-1,000 mg into feeding tube every 6 (six) hours as needed for mild pain., Disp: , Rfl:    amiodarone (PACERONE) 200 MG tablet, Take 1 tablet (200 mg total) by mouth daily., Disp: 30 tablet, Rfl: 6   apixaban (ELIQUIS) 5 MG TABS tablet, Take 1 tablet (5 mg total) by mouth 2 (two) times daily., Disp: 60 tablet, Rfl: 6   bisacodyl 5 MG EC tablet, One tablet every 3 days, if needed, for constipation, Disp: 30 tablet, Rfl: 1   clonazePAM (KLONOPIN) 0.5 MG tablet, Take 1 tablet (0.5 mg total) by mouth at bedtime., Disp: 30 tablet, Rfl: 5   docusate sodium (COLACE) 100 MG capsule, Take 1 capsule (100 mg total) by mouth 2 (two) times daily., Disp: , Rfl:    FARXIGA 10 MG TABS tablet, Take 1 tablet (10 mg total) by mouth daily before breakfast., Disp: 90 tablet, Rfl: 3   fluticasone (FLONASE) 50 MCG/ACT nasal spray, Place 2 sprays into both nostrils daily., Disp: 48 g, Rfl: 3   furosemide (LASIX) 20 MG tablet, Take 1 tablet (20 mg total) by mouth daily., Disp: 30 tablet, Rfl: 11   hydrocortisone (ANUSOL-HC) 2.5 % rectal cream, Place 1 Application rectally 2 (two) times daily., Disp: 30 g, Rfl: 3   hydrocortisone (ANUSOL-HC) 25 MG suppository, Place 1 suppository (25 mg total) rectally 2 (two) times daily., Disp: 12 suppository, Rfl: 3   lacosamide (VIMPAT) 50 MG TABS tablet, Take  1 tablet (50 mg total) by mouth 2 (two) times daily., Disp: 180 tablet, Rfl: 2   levETIRAcetam (KEPPRA) 500 MG tablet, Take 1 tablet (500 mg total) by mouth 2 (two) times daily., Disp: 180 tablet, Rfl: 3   montelukast (SINGULAIR) 10 MG tablet, Take 1 tablet (10 mg total) by mouth at bedtime., Disp: 90 tablet, Rfl: 3   pantoprazole (PROTONIX) 40 MG tablet, TAKE (1) TABLET BY MOUTH TWICE DAILY., Disp: 60 tablet, Rfl: 0   polyethylene glycol (MIRALAX) 17 g packet, Take one packet mira lax once daily in 8 ounces water, Disp: 28 each, Rfl: 5   potassium chloride SA (KLOR-CON M) 20 MEQ tablet, Take 1 tablet (20 mEq total) by mouth daily., Disp: 30 tablet, Rfl: 11   sertraline (ZOLOFT) 25 MG tablet, TAKE 1 TABLET BY MOUTH ONCE A DAY., Disp: 30 tablet, Rfl: 0  Past Medical History: 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   Angio-edema    Anxiety    Breast cancer (HCC) 2008   right - s/p lumpectomy->chemo, radiation   Depression    Dysrhythmia    hx SVT   Family history of colon cancer    Family history of prostate cancer    GERD (gastroesophageal reflux disease)    H/O: hysterectomy    Hiatal hernia    History of cancer chemotherapy    History  of radiation therapy    Hyperlipidemia    Hypertension 01/25/2018   Nonischemic cardiomyopathy (HCC)    Personal history of radiation therapy 01/05/209   SVT (supraventricular tachycardia) (HCC)    short RP SVT documented 5/14   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HCC)    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)   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    Tobacco Use: Social History   Tobacco Use  Smoking Status Never  Smokeless Tobacco Never    Labs: Review Flowsheet  More data exists      Latest Ref Rng & Units 03/24/2022 04/18/2022 12/31/2022 06/03/2023 06/24/2023  Labs for ITP Cardiac and Pulmonary Rehab  Cholestrol  100 - 199 mg/dL - 73  528  - -  LDL (calc) 0 - 99 mg/dL - 38  94  - -  HDL-C >41 mg/dL - 18  69  - -  Trlycerides 0 - 149 mg/dL - 85  81  - -  Hemoglobin A1c 4.8 - 5.6 % - 5.9  6.1  6.2  -  PH, Arterial 7.35 - 7.45 7.359  7.381  - - - -  PCO2 arterial 32 - 48 mmHg 40.6  39.2  - - - -  Bicarbonate 20.0 - 28.0 mmol/L 23.4  23.8  - - - -  TCO2 22 - 32 mmol/L 25  25  - - - 18   Acid-base deficit 0.0 - 2.0 mmol/L 3.0  2.0  - - - -  O2 Saturation % 100  100  - - - -    Details       Multiple values from one day are sorted in reverse-chronological order         Capillary Blood Glucose: Lab Results  Component Value Date   GLUCAP 86 06/22/2023   GLUCAP 91 06/21/2023   GLUCAP 78 09/22/2022   GLUCAP 91 09/22/2022   GLUCAP 114 (H) 09/22/2022     Exercise Target Goals: Exercise Program Goal: Individual exercise prescription set using results from initial 6 min walk test and THRR while considering  patient's activity barriers and safety.   Exercise Prescription Goal: Starting with aerobic activity 30 plus minutes a day, 3 days per week for initial exercise prescription. Provide home exercise prescription and guidelines that participant acknowledges understanding prior to discharge.  Activity Barriers & Risk Stratification:  Activity Barriers & Cardiac Risk Stratification - 10/29/23 1315       Activity Barriers & Cardiac Risk Stratification   Activity Barriers Left Knee Replacement;Deconditioning;Shortness of Breath;Balance Concerns;Muscular Weakness    Cardiac Risk Stratification Moderate             6 Minute Walk:  6 Minute Walk     Row Name 10/29/23 1450         6 Minute Walk   Phase Initial     Distance 720 feet     Walk Time 6 minutes     # of Rest Breaks 0     MPH 1.36     METS 1.55     RPE 9     VO2 Peak 5.43     Symptoms No     Resting HR 59 bpm     Resting BP 106/54     Resting Oxygen Saturation  95 %     Exercise Oxygen Saturation  during 6 min walk  88 %  Max Ex. HR 119 bpm     Max Ex. BP 148/74     2 Minute Post BP 106/62              Oxygen Initial Assessment:   Oxygen Re-Evaluation:   Oxygen Discharge (Final Oxygen Re-Evaluation):   Initial Exercise Prescription:  Initial Exercise Prescription - 10/29/23 1400       Date of Initial Exercise RX and Referring Provider   Date 10/29/23    Referring Provider [name] MD      Oxygen   Maintain Oxygen Saturation 88% or higher      NuStep   Level 1    SPM 80    Minutes 15    METs 2      Track   Laps 10    Minutes 15    METs 2      Prescription Details   Frequency (times per week) 2    Duration Progress to 30 minutes of continuous aerobic without signs/symptoms of physical distress      Intensity   THRR 40-80% of Max Heartrate 93-126    Ratings of Perceived Exertion 11-13    Perceived Dyspnea 0-4      Progression   Progression Continue to progress workloads to maintain intensity without signs/symptoms of physical distress.      Resistance Training   Training Prescription Yes    Weight 2 lb    Reps 10-15             Perform Capillary Blood Glucose checks as needed.  Exercise Prescription Changes:   Exercise Prescription Changes     Row Name 10/29/23 1400 11/13/23 1500           Response to Exercise   Blood Pressure (Admit) 106/54 106/60      Blood Pressure (Exercise) 148/74 138/58      Blood Pressure (Exit) 106/62 98/50      Heart Rate (Admit) 59 bpm 70 bpm      Heart Rate (Exercise) 119 bpm 96 bpm      Heart Rate (Exit) 64 bpm 95 bpm      Oxygen Saturation (Admit) 95 % --      Oxygen Saturation (Exercise) 88 % --      Rating of Perceived Exertion (Exercise) 9 11      Symptoms none --      Comments walk test results --      Duration -- Continue with 30 min of aerobic exercise without signs/symptoms of physical distress.      Intensity -- THRR unchanged        Progression   Progression -- Continue to progress workloads  to maintain intensity without signs/symptoms of physical distress.        Resistance Training   Training Prescription -- Yes      Weight -- 2      Reps -- 10-15        NuStep   Level -- 2      SPM -- 41      Minutes -- 15      METs -- 1.5        Track   Laps -- 24      Minutes -- 15      METs -- 2.3               Exercise Comments:   Exercise Comments     Row Name 10/29/23 1507 11/02/23 1429  Exercise Comments Shauntay restarted rehab today.  She was able to complete versus Nustep Test. First full day of exercise!  Patient was oriented to gym and equipment including functions, settings, policies, and procedures.  Patient's individual exercise prescription and treatment plan were reviewed.  All starting workloads were established based on the results of the 6 minute walk test done at initial orientation visit.  The plan for exercise progression was also introduced and progression will be customized based on patient's performance and goals.               Exercise Goals and Review:   Exercise Goals     Row Name 10/29/23 1457             Exercise Goals   Increase Physical Activity Yes       Intervention Provide advice, education, support and counseling about physical activity/exercise needs.;Develop an individualized exercise prescription for aerobic and resistive training based on initial evaluation findings, risk stratification, comorbidities and participant's personal goals.       Expected Outcomes Short Term: Attend rehab on a regular basis to increase amount of physical activity.;Long Term: Add in home exercise to make exercise part of routine and to increase amount of physical activity.;Long Term: Exercising regularly at least 3-5 days a week.       Increase Strength and Stamina Yes       Intervention Provide advice, education, support and counseling about physical activity/exercise needs.;Develop an individualized exercise prescription for aerobic  and resistive training based on initial evaluation findings, risk stratification, comorbidities and participant's personal goals.       Expected Outcomes Short Term: Increase workloads from initial exercise prescription for resistance, speed, and METs.;Short Term: Perform resistance training exercises routinely during rehab and add in resistance training at home;Long Term: Improve cardiorespiratory fitness, muscular endurance and strength as measured by increased METs and functional capacity ( )       Able to understand and use rate of perceived exertion (RPE) scale Yes       Intervention Provide education and explanation on how to use RPE scale       Expected Outcomes Short Term: Able to use RPE daily in rehab to express subjective intensity level;Long Term:  Able to use RPE to guide intensity level when exercising independently       Able to understand and use Dyspnea scale Yes       Intervention Provide education and explanation on how to use Dyspnea scale       Expected Outcomes Short Term: Able to use Dyspnea scale daily in rehab to express subjective sense of shortness of breath during exertion;Long Term: Able to use Dyspnea scale to guide intensity level when exercising independently       Knowledge and understanding of Target Heart Rate Range (THRR) Yes       Intervention Provide education and explanation of THRR including how the numbers were predicted and where they are located for reference       Expected Outcomes Short Term: Able to state/look up THRR;Long Term: Able to use THRR to govern intensity when exercising independently;Short Term: Able to use daily as guideline for intensity in rehab       Able to check pulse independently Yes       Intervention Provide education and demonstration on how to check pulse in carotid and radial arteries.;Review the importance of being able to check your own pulse for safety during independent exercise  Expected Outcomes Short Term: Able to  explain why pulse checking is important during independent exercise;Long Term: Able to check pulse independently and accurately       Understanding of Exercise Prescription Yes       Intervention Provide education, explanation, and written materials on patient's individual exercise prescription       Expected Outcomes Short Term: Able to explain program exercise prescription;Long Term: Able to explain home exercise prescription to exercise independently                Exercise Goals Re-Evaluation :  Exercise Goals Re-Evaluation     Row Name 11/02/23 1429 11/16/23 0932           Exercise Goal Re-Evaluation   Exercise Goals Review -- Increase Physical Activity;Increase Strength and Stamina;Understanding of Exercise Prescription      Comments Reviewed RPE and dyspnea scale, THR and program prescription with pt today.  Pt voiced understanding and was given a copy of goals to take home. Sondos is doing well i rehab and is tolerating exericse well. She is getting stronger and walking more laps each visit. Will continue to monitor and progress asable.      Expected Outcomes Short: Use RPE daily to regulate intensity.  Long: Follow program prescription in THR. Continue to attend rehab                Discharge Exercise Prescription (Final Exercise Prescription Changes):  Exercise Prescription Changes - 11/13/23 1500       Response to Exercise   Blood Pressure (Admit) 106/60    Blood Pressure (Exercise) 138/58    Blood Pressure (Exit) 98/50    Heart Rate (Admit) 70 bpm    Heart Rate (Exercise) 96 bpm    Heart Rate (Exit) 95 bpm    Rating of Perceived Exertion (Exercise) 11    Duration Continue with 30 min of aerobic exercise without signs/symptoms of physical distress.    Intensity THRR unchanged      Progression   Progression Continue to progress workloads to maintain intensity without signs/symptoms of physical distress.      Resistance Training   Training Prescription Yes     Weight 2    Reps 10-15      NuStep   Level 2    SPM 41    Minutes 15    METs 1.5      Track   Laps 24    Minutes 15    METs 2.3             Nutrition:  Target Goals: Understanding of nutrition guidelines, daily intake of sodium 1500mg , cholesterol 200mg , calories 30% from fat and 7% or less from saturated fats, daily to have 5 or more servings of fruits and vegetables.  Biometrics:  Pre Biometrics - 10/29/23 1458       Pre Biometrics   Height 4' 10.25" (1.48 m)    Weight 145 lb 1.6 oz (65.8 kg)    Waist Circumference 32 inches    Hip Circumference 40 inches    Waist to Hip Ratio 0.8 %    BMI (Calculated) 30.05    Grip Strength 22 kg    Single Leg Stand 0 seconds              Nutrition Therapy Plan and Nutrition Goals:  Nutrition Therapy & Goals - 10/29/23 1319       Intervention Plan   Intervention Prescribe, educate and counsel regarding individualized specific dietary modifications  aiming towards targeted core components such as weight, hypertension, lipid management, diabetes, heart failure and other comorbidities.;Nutrition handout(s) given to patient.    Expected Outcomes Short Term Goal: Understand basic principles of dietary content, such as calories, fat, sodium, cholesterol and nutrients.;Long Term Goal: Adherence to prescribed nutrition plan.             Nutrition Assessments:  MEDIFICTS Score Key: >=70 Need to make dietary changes  40-70 Heart Healthy Diet <= 40 Therapeutic Level Cholesterol Diet  Flowsheet Row CARDIAC REHAB PHASE II ORIENTATION from 10/29/2023 in [facility] CARDIAC REHABILITATION  Picture Your Plate Total Score on Admission 73      Picture Your Plate Scores: <40 Unhealthy dietary pattern with much room for improvement. 41-50 Dietary pattern unlikely to meet recommendations for good health and room for improvement. 51-60 More healthful dietary pattern, with some room for improvement.  >60 Healthy dietary pattern,  although there may be some specific behaviors that could be improved.    Nutrition Goals Re-Evaluation:   Nutrition Goals Discharge (Final Nutrition Goals Re-Evaluation):   Psychosocial: Target Goals: Acknowledge presence or absence of significant depression and/or stress, maximize coping skills, provide positive support system. Participant is able to verbalize types and ability to use techniques and skills needed for reducing stress and depression.  Initial Review & Psychosocial Screening:  Initial Psych Review & Screening - 10/29/23 1318       Initial Review   Current issues with None Identified      Family Dynamics   Good Support System? Yes   husband, daughter, care takers     Barriers   Psychosocial barriers to participate in program The patient should benefit from training in stress management and relaxation.;There are no identifiable barriers or psychosocial needs.      Screening Interventions   Interventions Encouraged to exercise;To provide support and resources with identified psychosocial needs;Provide feedback about the scores to participant    Expected Outcomes Short Term goal: Utilizing psychosocial counselor, staff and physician to assist with identification of specific Stressors or current issues interfering with healing process. Setting desired goal for each stressor or current issue identified.;Long Term Goal: Stressors or current issues are controlled or eliminated.;Short Term goal: Identification and review with participant of any Quality of Life or Depression concerns found by scoring the questionnaire.;Long Term goal: The participant improves quality of Life and PHQ9 Scores as seen by post scores and/or verbalization of changes             Quality of Life Scores:  Quality of Life - 10/29/23 1502       Quality of Life   Select Quality of Life      Quality of Life Scores   Health/Function Pre 25.2 %    Socioeconomic Pre 30 %    Psych/Spiritual Pre  23.14 %    Family Pre 30 %    GLOBAL Pre 26.57 %            Scores of 19 and below usually indicate a poorer quality of life in these areas.  A difference of  2-3 points is a clinically meaningful difference.  A difference of 2-3 points in the total score of the Quality of Life Index has been associated with significant improvement in overall quality of life, self-image, physical symptoms, and general health in studies assessing change in quality of life.  PHQ-9: Review Flowsheet  More data exists      10/29/2023 08/17/2023 08/11/2023 08/05/2023 07/22/2023  Depression screen  PHQ 2/9  Decreased Interest 2 1 0 0 0 0  Down, Depressed, Hopeless 0 0 0 0 0 0  PHQ - 2 Score 2 1 0 0 0 0  Altered sleeping 1 0 - - -  Tired, decreased energy 1 1 - - -  Change in appetite 1 1 - - -  Feeling bad or failure about yourself  1 0 - - -  Trouble concentrating 0 0 - - -  Moving slowly or fidgety/restless 1 0 - - -  Suicidal thoughts 0 0 - - -  PHQ-9 Score 7 3 - - -  Difficult doing work/chores Very difficult Somewhat difficult - - -    Details       Multiple values from one day are sorted in reverse-chronological order        Interpretation of Total Score  Total Score Depression Severity:  1-4 = Minimal depression, 5-9 = Mild depression, 10-14 = Moderate depression, 15-19 = Moderately severe depression, 20-27 = Severe depression   Psychosocial Evaluation and Intervention:  Psychosocial Evaluation - 10/29/23 1458       Psychosocial Evaluation & Interventions   Interventions Stress management education;Relaxation education;Encouraged to exercise with the program and follow exercise prescription    Comments Kaya is returning to cardiac rehab to finish program.  She called to stop previously due to not feeling well and husband was recovering from surgery.  She has paid caregivers at home that help with appts and documentations.  She denied any depression symptoms or major stressors.  Her  caretaker will be bringing her to classes each day and they do not percieve any barriers to attending.  She is walking with gait belt with family and caregiver but no longer in wheelchair and using walker some at home.  She did her walk test today using a rollator walker for support.  Her knee and foot have been less painful recently and she wants to walk more.  Her goal is to be able to walk unassisted.  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  She sleeps well and her family keep her moving more at home.    Expected Outcomes Short: Attend program to build up stamina Long: Conitnue to work on walking and independence    Continue Psychosocial Services  Follow up required by staff             Psychosocial Re-Evaluation:   Psychosocial Discharge (Final Psychosocial Re-Evaluation):   Vocational Rehabilitation: Provide vocational rehab assistance to qualifying candidates.   Vocational Rehab Evaluation & Intervention:  Vocational Rehab - 10/29/23 1319       Initial Vocational Rehab Evaluation & Intervention   Assessment shows need for Vocational Rehabilitation No   retired            Education: Education Goals: Education classes will be provided on a weekly basis, covering required topics. Participant will state understanding/return demonstration of topics presented.  Learning Barriers/Preferences:  Learning Barriers/Preferences - 10/29/23 1314       Learning Barriers/Preferences   Learning Barriers Sight   glasses for reading   Learning Preferences None             Education Topics: Hypertension, Hypertension Reduction -Define heart disease and high blood pressure. Discus how high blood pressure affects the body and ways to reduce high blood pressure.   Exercise and Your Heart -Discuss why it is important to exercise, the FITT principles of exercise, normal and abnormal responses to  exercise, and how to exercise safely. Flowsheet Row CARDIAC REHAB  PHASE II EXERCISE from 11/04/2023 in [place] CARDIAC REHABILITATION  Date 11/04/23  Educator HB  Instruction Review Code 1- Verbalizes Understanding       Angina -Discuss definition of angina, causes of angina, treatment of angina, and how to decrease risk of having angina.   Cardiac Medications -Review what the following cardiac medications are used for, how they affect the body, and side effects that may occur when taking the medications.  Medications include Aspirin, Beta blockers, calcium channel blockers, ACE Inhibitors, angiotensin receptor blockers, diuretics, digoxin, and antihyperlipidemics.   Congestive Heart Failure -Discuss the definition of CHF, how to live with CHF, the signs and symptoms of CHF, and how keep track of weight and sodium intake.   Heart Disease and Intimacy -Discus the effect sexual activity has on the heart, how changes occur during intimacy as we age, and safety during sexual activity. Flowsheet Row CARDIAC REHAB PHASE II EXERCISE from 08/26/2023 in [place] CARDIAC REHABILITATION  Date 08/26/23  Educator jh  Instruction Review Code 1- Verbalizes Understanding       Smoking Cessation / COPD -Discuss different methods to quit smoking, the health benefits of quitting smoking, and the definition of COPD.   Nutrition I: Fats -Discuss the types of cholesterol, what cholesterol does to the heart, and how cholesterol levels can be controlled.   Nutrition II: Labels -Discuss the different components of food labels and how to read food label   Heart Parts/Heart Disease and PAD -Discuss the anatomy of the heart, the pathway of blood circulation through the heart, and these are affected by heart disease.   Stress I: Signs and Symptoms -Discuss the causes of stress, how stress may lead to anxiety and depression, and ways to limit stress.   Stress II: Relaxation -Discuss different types of relaxation techniques to limit stress.   Warning  Signs of Stroke / TIA -Discuss definition of a stroke, what the signs and symptoms are of a stroke, and how to identify when someone is having stroke.   Knowledge Questionnaire Score:  Knowledge Questionnaire Score - 10/29/23 1503       Knowledge Questionnaire Score   Pre Score 18/24             Core Components/Risk Factors/Patient Goals at Admission:  Personal Goals and Risk Factors at Admission - 10/29/23 1503       Core Components/Risk Factors/Patient Goals on Admission    Weight Management Yes;Weight Loss;Obesity    Intervention Weight Management: Develop a combined nutrition and exercise program designed to reach desired caloric intake, while maintaining appropriate intake of nutrient and fiber, sodium and fats, and appropriate energy expenditure required for the weight goal.;Weight Management: Provide education and appropriate resources to help participant work on and attain dietary goals.;Weight Management/Obesity: Establish reasonable short term and long term weight goals.;Obesity: Provide education and appropriate resources to help participant work on and attain dietary goals.    Admit Weight 145 lb 1.6 oz (65.8 kg)    Goal Weight: Short Term 140 lb (63.5 kg)    Goal Weight: Long Term 135 lb (61.2 kg)    Expected Outcomes Long Term: Adherence to nutrition and physical activity/exercise program aimed toward attainment of established weight goal;Short Term: Continue to assess and modify interventions until short term weight is achieved;Weight Loss: Understanding of general recommendations for a balanced deficit meal plan, which promotes 1-2 lb weight loss per week and includes a negative energy  balance of [phone] kcal/d;Understanding recommendations for meals to include 15-35% energy as protein, 25-35% energy from fat, 35-60% energy from carbohydrates, less than 200mg  of dietary cholesterol, 20-35 gm of total fiber daily;Understanding of distribution of calorie intake throughout  the day with the consumption of 4-5 meals/snacks    Heart Failure Yes    Intervention Provide a combined exercise and nutrition program that is supplemented with education, support and counseling about heart failure. Directed toward relieving symptoms such as shortness of breath, decreased exercise tolerance, and extremity edema.    Expected Outcomes Improve functional capacity of life;Short term: Attendance in program 2-3 days a week with increased exercise capacity. Reported lower sodium intake. Reported increased fruit and vegetable intake. Reports medication compliance.;Short term: Daily weights obtained and reported for increase. Utilizing diuretic protocols set by physician.;Long term: Adoption of self-care skills and reduction of barriers for early signs and symptoms recognition and intervention leading to self-care maintenance.    Hypertension Yes    Intervention Provide education on lifestyle modifcations including regular physical activity/exercise, weight management, moderate sodium restriction and increased consumption of fresh fruit, vegetables, and low fat dairy, alcohol moderation, and smoking cessation.;Monitor prescription use compliance.    Expected Outcomes Short Term: Continued assessment and intervention until BP is < 140/40mm HG in hypertensive participants. < 130/68mm HG in hypertensive participants with diabetes, heart failure or chronic kidney disease.;Long Term: Maintenance of blood pressure at goal levels.    Lipids Yes    Intervention Provide education and support for participant on nutrition & aerobic/resistive exercise along with prescribed medications to achieve LDL 70mg , HDL >40mg .    Expected Outcomes Long Term: Cholesterol controlled with medications as prescribed, with individualized exercise RX and with personalized nutrition plan. Value goals: LDL < 70mg , HDL > 40 mg.;Short Term: Participant states understanding of desired cholesterol values and is compliant with  medications prescribed. Participant is following exercise prescription and nutrition guidelines.             Core Components/Risk Factors/Patient Goals Review:    Core Components/Risk Factors/Patient Goals at Discharge (Final Review):    ITP Comments:  ITP Comments     Row Name 10/29/23 1445 11/02/23 1428 11/18/23 0854       ITP Comments Patient attend orientation today.  Patient is attending Cardiac Rehabilitation Program.  Documentation for diagnosis can be found in [facility] 09/04/23.  Reviewed medical chart, RPE/RPD, gym safety, and program guidelines.  Patient was fitted to equipment they will be using during rehab.  Patient is scheduled to start exercise on Monday 11/02/23 at 1430.   Initial ITP created and sent for review and signature by Dr. Dina Rich, Medical Director for Cardiac Rehabilitation Program.  Pt is returning to rehab from previous sessions that she had cancelled in December.  Today, is session 4 and she will finish her 36 sessions. First full day of exercise!  Patient was oriented to gym and equipment including functions, settings, policies, and procedures.  Patient's individual exercise prescription and treatment plan were reviewed.  All starting workloads were established based on the results of the 6 minute walk test done at initial orientation visit.  The plan for exercise progression was also introduced and progression will be customized based on patient's performance and goals. 30 day review completed. ITP sent to Dr. Dina Rich, Medical Director of Cardiac Rehab. Continue with ITP unless changes are made by physician.    Still new to program.  Comments: 30 day review

## 2023-11-20 ENCOUNTER — Other Ambulatory Visit: Payer: Self-pay | Admitting: Family Medicine

## 2023-11-23 ENCOUNTER — Encounter (HOSPITAL_COMMUNITY)
Admission: RE | Admit: 2023-11-23 | Discharge: 2023-11-23 | Disposition: A | Discharge status: Home / Self Care | Payer: Medicare Other | Source: Ambulatory Visit | Attending: Cardiology | Admitting: Cardiology

## 2023-11-23 ENCOUNTER — Encounter (HOSPITAL_COMMUNITY): Payer: Medicare Other

## 2023-11-23 DIAGNOSIS — I5022 Chronic systolic (congestive) heart failure: Secondary | ICD-10-CM | POA: Diagnosis not present

## 2023-11-23 NOTE — Progress Notes (Signed)
 Daily Session Note  Patient Details  Name: Tara [name] MRN: [id number] Date of Birth: 1945/12/21 Referring Provider:   Flowsheet Row CARDIAC REHAB PHASE II ORIENTATION from 10/29/2023 in [facility] CARDIAC REHABILITATION  Referring Provider [name] MD       Encounter Date: 11/23/2023  Check In:  Session Check In - 11/23/23 1430       Check-In   Supervising physician immediately available to respond to emergencies See telemetry face sheet for immediately available MD    Location [facility]    Staff Present Ross Ludwig, BS, Exercise Physiologist;Phyllis Billingsley, RN    Virtual Visit No    Medication changes reported     No    Fall or balance concerns reported    No    Tobacco Cessation No Change    Warm-up and Cool-down Performed on first and last piece of equipment    Resistance Training Performed Yes    VAD Patient? No    PAD/SET Patient? No      Pain Assessment   Currently in Pain? No/denies    Multiple Pain Sites No             Capillary Blood Glucose: No results found. However, due to the size of the patient record, not all encounters were searched. Please check Results Review for a complete set of results.    Social History   Tobacco Use  Smoking Status Never  Smokeless Tobacco Never    Goals Met:  Independence with exercise equipment Exercise tolerated well No report of concerns or symptoms today Strength training completed today  Goals Unmet:  Not Applicable  Comments: Pt able to follow exercise prescription today without complaint.  Will continue to monitor for progression.

## 2023-11-24 ENCOUNTER — Other Ambulatory Visit: Payer: Self-pay | Admitting: Family Medicine

## 2023-11-25 ENCOUNTER — Encounter (HOSPITAL_COMMUNITY)
Admission: RE | Admit: 2023-11-25 | Discharge: 2023-11-25 | Disposition: A | Discharge status: Home / Self Care | Payer: Medicare Other | Source: Ambulatory Visit | Attending: Cardiology | Admitting: Cardiology

## 2023-11-25 ENCOUNTER — Encounter (HOSPITAL_COMMUNITY): Payer: Medicare Other

## 2023-11-25 ENCOUNTER — Ambulatory Visit: Payer: Medicare Other | Admitting: Family Medicine

## 2023-11-25 DIAGNOSIS — I5022 Chronic systolic (congestive) heart failure: Secondary | ICD-10-CM

## 2023-11-25 NOTE — Progress Notes (Signed)
 Daily Session Note  Patient Details  Name: KIRSTI MCALPINE MRN: [id number] Date of Birth: 1945/10/07 Referring Provider:   Flowsheet Row CARDIAC REHAB PHASE II ORIENTATION from 10/29/2023 in [facility] CARDIAC REHABILITATION  Referring Provider [name] MD       Encounter Date: 11/25/2023  Check In:  Session Check In - 11/25/23 1409       Check-In   Supervising physician immediately available to respond to emergencies See telemetry face sheet for immediately available MD    Location [facility]    Staff Present Desiree Lucy, BSN, RN, WTA-C;Hillary Troutman BSN, RN;Jessica [place], MA, RCEP, CCRP, CCET;Heather [place], [place], Exercise Physiologist    Virtual Visit No    Medication changes reported     No    Fall or balance concerns reported    No    Tobacco Cessation No Change    Warm-up and Cool-down Performed on first and last piece of equipment    Resistance Training Performed Yes    VAD Patient? No    PAD/SET Patient? No             Capillary Blood Glucose: No results found. However, due to the size of the patient record, not all encounters were searched. Please check Results Review for a complete set of results.    Social History   Tobacco Use  Smoking Status Never  Smokeless Tobacco Never    Goals Met:  Independence with exercise equipment Achieving weight loss Exercise tolerated well No report of concerns or symptoms today Strength training completed today  Goals Unmet:  Not Applicable  Comments: Pt able to follow exercise prescription today without complaint.  Will continue to monitor for progression.

## 2023-11-30 ENCOUNTER — Encounter (HOSPITAL_COMMUNITY)
Admission: RE | Admit: 2023-11-30 | Discharge: 2023-11-30 | Disposition: A | Discharge status: Home / Self Care | Payer: Medicare Other | Source: Ambulatory Visit | Attending: Cardiology | Admitting: Cardiology

## 2023-11-30 ENCOUNTER — Encounter (HOSPITAL_COMMUNITY): Payer: Medicare Other

## 2023-11-30 ENCOUNTER — Telehealth: Payer: Self-pay | Admitting: Family Medicine

## 2023-11-30 DIAGNOSIS — I5022 Chronic systolic (congestive) heart failure: Secondary | ICD-10-CM | POA: Diagnosis not present

## 2023-11-30 NOTE — Progress Notes (Signed)
 Daily Session Note  Patient Details  Name: Tara [name] MRN: [id number] Date of Birth: 1946-06-07 Referring Provider:   Flowsheet Row CARDIAC REHAB PHASE II ORIENTATION from 10/29/2023 in [facility] CARDIAC REHABILITATION  Referring Provider [name] MD       Encounter Date: 11/30/2023  Check In:  Session Check In - 11/30/23 1435       Check-In   Supervising physician immediately available to respond to emergencies See telemetry face sheet for immediately available MD    Location [facility]    Staff Present Fabio Pierce, MA, RCEP, CCRP, CCET;Brittany Roseanne [place], BSN, RN, WTA-C    Virtual Visit No    Medication changes reported     No    Fall or balance concerns reported    No    Warm-up and Cool-down Performed on first and last piece of equipment    Resistance Training Performed Yes    VAD Patient? No    PAD/SET Patient? No      Pain Assessment   Currently in Pain? No/denies             Capillary Blood Glucose: No results found. However, due to the size of the patient record, not all encounters were searched. Please check Results Review for a complete set of results.    Social History   Tobacco Use  Smoking Status Never  Smokeless Tobacco Never    Goals Met:  Independence with exercise equipment Exercise tolerated well No report of concerns or symptoms today Strength training completed today  Goals Unmet:  Not Applicable  Comments: Pt able to follow exercise prescription today without complaint.  Will continue to monitor for progression.

## 2023-11-30 NOTE — Telephone Encounter (Signed)
 Patient came by the office ask to ask Dr Lodema Hong if she is only any itching medication. Call back # [phone].

## 2023-12-02 ENCOUNTER — Encounter (HOSPITAL_COMMUNITY): Payer: Medicare Other

## 2023-12-02 ENCOUNTER — Telehealth: Payer: Self-pay | Admitting: Pharmacy Technician

## 2023-12-02 ENCOUNTER — Other Ambulatory Visit (HOSPITAL_COMMUNITY): Payer: Self-pay

## 2023-12-02 ENCOUNTER — Encounter (HOSPITAL_COMMUNITY)
Admission: RE | Admit: 2023-12-02 | Discharge: 2023-12-02 | Disposition: A | Discharge status: Home / Self Care | Payer: Medicare Other | Source: Ambulatory Visit | Attending: Cardiology | Admitting: Cardiology

## 2023-12-02 DIAGNOSIS — I5022 Chronic systolic (congestive) heart failure: Secondary | ICD-10-CM | POA: Diagnosis not present

## 2023-12-02 MED ORDER — HYDROXYZINE HCL 10 MG PO TABS: ORAL_TABLET | ORAL | 3 refills | Status: DC

## 2023-12-02 NOTE — Progress Notes (Signed)
 Daily Session Note  Patient Details  Name: Tara [name] MRN: [id number] Date of Birth: May 12, 1946 Referring Provider:   Flowsheet Row CARDIAC REHAB PHASE II ORIENTATION from 10/29/2023 in [facility] CARDIAC REHABILITATION  Referring Provider [name] MD       Encounter Date: 12/02/2023  Check In:  Session Check In - 12/02/23 1410       Check-In   Supervising physician immediately available to respond to emergencies See telemetry face sheet for immediately available MD    Location [facility]    Staff Present Avanell Shackleton BSN, RN;Heather Fredric M[place], [place], Exercise Physiologist    Virtual Visit No    Medication changes reported     No    Fall or balance concerns reported    No    Tobacco Cessation No Change    Warm-up and Cool-down Performed on first and last piece of equipment    Resistance Training Performed Yes    VAD Patient? No    PAD/SET Patient? No      Pain Assessment   Currently in Pain? No/denies    Multiple Pain Sites No             Capillary Blood Glucose: No results found. However, due to the size of the patient record, not all encounters were searched. Please check Results Review for a complete set of results.    Social History   Tobacco Use  Smoking Status Never  Smokeless Tobacco Never    Goals Met:  Independence with exercise equipment Exercise tolerated well No report of concerns or symptoms today Strength training completed today  Goals Unmet:  Not Applicable  Comments: Marland KitchenMarland KitchenPt able to follow exercise prescription today without complaint.  Will continue to monitor for progression.

## 2023-12-02 NOTE — Telephone Encounter (Signed)
 Hydroxyzine is prescribed for use at bedtime for itching, please let her know

## 2023-12-02 NOTE — Telephone Encounter (Signed)
 Patient needs to know if Dr Lodema Hong has given her anything for itching.  Please contact patient at [phone].

## 2023-12-02 NOTE — Telephone Encounter (Signed)
 Came into the office and states every night she has been itching all over her body, its only at night. Is fine during the day but after she takes her bedtime meds and goes to bed, she starts itching all over her back, arms, legs. No rash or bumps, just scratching and its hard for her to sleep. Wants something called in for it. Has been using itch cream with no relief

## 2023-12-02 NOTE — Telephone Encounter (Signed)
 Lvm making pt aware that med had been sent in

## 2023-12-02 NOTE — Telephone Encounter (Signed)
 Pharmacy Patient Advocate Encounter   Received notification from Patient Pharmacy that prior authorization for HYDROXYZINE 10MG  TABLETS is required/requested.   Insurance verification completed.   The patient is insured through [facility] .   Per test claim: PA required; PA submitted to above mentioned insurance via CoverMyMeds Key/confirmation #/EOC [id number] Status is pending

## 2023-12-02 NOTE — Telephone Encounter (Signed)
 Is the pt WANTING something for itching or wanting to know if she TAKES anything for itching? Nothing for itching on her list

## 2023-12-03 ENCOUNTER — Other Ambulatory Visit (HOSPITAL_COMMUNITY): Payer: Self-pay

## 2023-12-03 NOTE — Telephone Encounter (Signed)
 Pharmacy Patient Advocate Encounter  Received notification from [facility] that Prior Authorization for HYDROXYZINE 10MG  TABLETS has been APPROVED from 12/02/2023 to 12/01/2024. Ran test claim, Copay is $2.72. This test claim was processed through [facility]- copay amounts may vary at other pharmacies due to pharmacy/plan contracts, or as the patient moves through the different stages of their insurance plan.   PA #/Case ID/Reference #: [id number]

## 2023-12-07 ENCOUNTER — Encounter (HOSPITAL_COMMUNITY)
Admission: RE | Admit: 2023-12-07 | Discharge: 2023-12-07 | Disposition: A | Discharge status: Home / Self Care | Payer: Medicare Other | Source: Ambulatory Visit | Attending: Cardiology | Admitting: Cardiology

## 2023-12-07 ENCOUNTER — Encounter (HOSPITAL_COMMUNITY): Payer: Medicare Other

## 2023-12-07 DIAGNOSIS — I5022 Chronic systolic (congestive) heart failure: Secondary | ICD-10-CM

## 2023-12-07 NOTE — Progress Notes (Signed)
 Daily Session Note  Patient Details  Name: Tara [name] MRN: [id number] Date of Birth: [birth year] Referring Provider:   Flowsheet Row CARDIAC REHAB PHASE II ORIENTATION from 10/29/2023 in [facility] CARDIAC REHABILITATION  Referring Provider [name] MD       Encounter Date: 12/07/2023  Check In:  Session Check In - 12/07/23 1430       Check-In   Supervising physician immediately available to respond to emergencies See telemetry face sheet for immediately available MD    Location [facility]    Staff Present Ross Ludwig, BS, Exercise Physiologist;Brittany Roseanne Reno, BSN, RN, WTA-C;Kynadi Dragos, RN;Jessica [place], MA, RCEP, CCRP, CCET    Virtual Visit No    Medication changes reported     No    Fall or balance concerns reported    No    Warm-up and Cool-down Performed on first and last piece of equipment    Resistance Training Performed Yes    VAD Patient? No    PAD/SET Patient? No      Pain Assessment   Currently in Pain? No/denies    Multiple Pain Sites No             Capillary Blood Glucose: No results found. However, due to the size of the patient record, not all encounters were searched. Please check Results Review for a complete set of results.    Social History   Tobacco Use  Smoking Status Never  Smokeless Tobacco Never    Goals Met:  Independence with exercise equipment Exercise tolerated well No report of concerns or symptoms today Strength training completed today  Goals Unmet:  Not Applicable  Comments: Pt able to follow exercise prescription today without complaint.  Will continue to monitor for progression.

## 2023-12-09 ENCOUNTER — Encounter (HOSPITAL_COMMUNITY)
Admission: RE | Admit: 2023-12-09 | Discharge: 2023-12-09 | Disposition: A | Discharge status: Home / Self Care | Payer: Medicare Other | Source: Ambulatory Visit | Attending: Cardiology

## 2023-12-09 ENCOUNTER — Encounter (HOSPITAL_COMMUNITY): Payer: Medicare Other

## 2023-12-09 DIAGNOSIS — I5022 Chronic systolic (congestive) heart failure: Secondary | ICD-10-CM | POA: Diagnosis not present

## 2023-12-09 NOTE — Progress Notes (Signed)
 Daily Session Note  Patient Details  Name: CAREENA DEGRAFFENREID MRN: [id number] Date of Birth: 31-May-1946 Referring Provider:   Flowsheet Row CARDIAC REHAB PHASE II ORIENTATION from 10/29/2023 in [facility] CARDIAC REHABILITATION  Referring Provider [name] MD       Encounter Date: 12/09/2023  Check In:  Session Check In - 12/09/23 1405       Check-In   Supervising physician immediately available to respond to emergencies See telemetry face sheet for immediately available MD    Location [facility]    Staff Present Avanell Shackleton BSN, RN;Heather Fredric Mare, [place], Exercise Physiologist    Virtual Visit No    Medication changes reported     No    Fall or balance concerns reported    No    Tobacco Cessation No Change    Warm-up and Cool-down Performed on first and last piece of equipment    Resistance Training Performed Yes    VAD Patient? No    PAD/SET Patient? No      Pain Assessment   Currently in Pain? No/denies    Multiple Pain Sites No             Capillary Blood Glucose: No results found. However, due to the size of the patient record, not all encounters were searched. Please check Results Review for a complete set of results.    Social History   Tobacco Use  Smoking Status Never  Smokeless Tobacco Never    Goals Met:  Independence with exercise equipment Exercise tolerated well No report of concerns or symptoms today Strength training completed today  Goals Unmet:  Not Applicable  Comments: Marland KitchenMarland KitchenPt able to follow exercise prescription today without complaint.  Will continue to monitor for progression.

## 2023-12-14 ENCOUNTER — Encounter (HOSPITAL_COMMUNITY): Payer: Medicare Other

## 2023-12-14 ENCOUNTER — Telehealth (HOSPITAL_COMMUNITY): Payer: Self-pay | Admitting: Cardiology

## 2023-12-14 ENCOUNTER — Encounter (HOSPITAL_COMMUNITY)
Admission: RE | Admit: 2023-12-14 | Discharge: 2023-12-14 | Disposition: A | Discharge status: Home / Self Care | Payer: Medicare Other | Source: Ambulatory Visit | Attending: Cardiology | Admitting: Cardiology

## 2023-12-14 DIAGNOSIS — I5022 Chronic systolic (congestive) heart failure: Secondary | ICD-10-CM | POA: Diagnosis not present

## 2023-12-14 NOTE — Progress Notes (Signed)
 Daily Session Note  Patient Details  Name: KELITA WALLIS MRN: [id number] Date of Birth: Jul 26, 1946 Referring Provider:   Flowsheet Row CARDIAC REHAB PHASE II ORIENTATION from 10/29/2023 in [facility] CARDIAC REHABILITATION  Referring Provider [name] MD       Encounter Date: 12/14/2023  Check In:  Session Check In - 12/14/23 1430       Check-In   Supervising physician immediately available to respond to emergencies See telemetry face sheet for immediately available MD    Location [facility]    Staff Present Erskine Speed, RN;Jessica [place], MA, RCEP, CCRP, Dow Adolph, RN, BSN    Virtual Visit No    Medication changes reported     No    Fall or balance concerns reported    No    Warm-up and Cool-down Performed on first and last piece of equipment    Resistance Training Performed Yes    VAD Patient? No    PAD/SET Patient? No      Pain Assessment   Currently in Pain? No/denies    Multiple Pain Sites No             Capillary Blood Glucose: No results found. However, due to the size of the patient record, not all encounters were searched. Please check Results Review for a complete set of results.    Social History   Tobacco Use  Smoking Status Never  Smokeless Tobacco Never    Goals Met:  Independence with exercise equipment Exercise tolerated well No report of concerns or symptoms today Strength training completed today  Goals Unmet:  Not Applicable  Comments: Pt able to follow exercise prescription today without complaint.  Will continue to monitor for progression.

## 2023-12-15 ENCOUNTER — Ambulatory Visit (HOSPITAL_COMMUNITY)
Admission: RE | Admit: 2023-12-15 | Discharge: 2023-12-15 | Disposition: A | Discharge status: Home / Self Care | Payer: Medicare Other | Source: Ambulatory Visit | Attending: Cardiology | Admitting: Cardiology

## 2023-12-15 ENCOUNTER — Ambulatory Visit (HOSPITAL_BASED_OUTPATIENT_CLINIC_OR_DEPARTMENT_OTHER)
Admission: RE | Admit: 2023-12-15 | Discharge: 2023-12-15 | Disposition: A | Discharge status: Home / Self Care | Payer: Medicare Other | Source: Ambulatory Visit | Attending: Cardiology | Admitting: Cardiology

## 2023-12-15 ENCOUNTER — Encounter (HOSPITAL_COMMUNITY): Payer: Self-pay | Admitting: Cardiology

## 2023-12-15 VITALS — BP 104/70 | HR 58 | Wt 144.0 lb

## 2023-12-15 DIAGNOSIS — Z9011 Acquired absence of right breast and nipple: Secondary | ICD-10-CM | POA: Diagnosis not present

## 2023-12-15 DIAGNOSIS — N183 Chronic kidney disease, stage 3 unspecified: Secondary | ICD-10-CM | POA: Insufficient documentation

## 2023-12-15 DIAGNOSIS — I428 Other cardiomyopathies: Secondary | ICD-10-CM | POA: Insufficient documentation

## 2023-12-15 DIAGNOSIS — Z9221 Personal history of antineoplastic chemotherapy: Secondary | ICD-10-CM | POA: Insufficient documentation

## 2023-12-15 DIAGNOSIS — Z7901 Long term (current) use of anticoagulants: Secondary | ICD-10-CM | POA: Diagnosis not present

## 2023-12-15 DIAGNOSIS — I5022 Chronic systolic (congestive) heart failure: Secondary | ICD-10-CM | POA: Insufficient documentation

## 2023-12-15 DIAGNOSIS — I130 Hypertensive heart and chronic kidney disease with heart failure and stage 1 through stage 4 chronic kidney disease, or unspecified chronic kidney disease: Secondary | ICD-10-CM | POA: Diagnosis not present

## 2023-12-15 DIAGNOSIS — I459 Conduction disorder, unspecified: Secondary | ICD-10-CM | POA: Insufficient documentation

## 2023-12-15 DIAGNOSIS — Z853 Personal history of malignant neoplasm of breast: Secondary | ICD-10-CM | POA: Diagnosis not present

## 2023-12-15 DIAGNOSIS — E785 Hyperlipidemia, unspecified: Secondary | ICD-10-CM | POA: Diagnosis not present

## 2023-12-15 DIAGNOSIS — I4819 Other persistent atrial fibrillation: Secondary | ICD-10-CM | POA: Insufficient documentation

## 2023-12-15 DIAGNOSIS — Z79899 Other long term (current) drug therapy: Secondary | ICD-10-CM | POA: Insufficient documentation

## 2023-12-15 LAB — LIPID PANEL
Cholesterol: 255 mg/dL — ABNORMAL HIGH (ref 0–200)
HDL: 75 mg/dL (ref >40)
LDL Cholesterol: 169 mg/dL — ABNORMAL HIGH (ref 0–99)
Total CHOL/HDL Ratio: 3.4 ratio
Triglycerides: 55 mg/dL (ref <150)
VLDL: 11 mg/dL (ref 0–40)

## 2023-12-15 LAB — BASIC METABOLIC PANEL
Anion gap: 8 (ref 5–15)
BUN: 31 mg/dL — ABNORMAL HIGH (ref 8–23)
CO2: 23 mmol/L (ref 22–32)
Calcium: 9.1 mg/dL (ref 8.9–10.3)
Chloride: 112 mmol/L — ABNORMAL HIGH (ref 98–111)
Creatinine, Ser: 1.61 mg/dL — ABNORMAL HIGH (ref 0.44–1.00)
GFR, Estimated: 33 mL/min — ABNORMAL LOW (ref >60)
Glucose, Bld: 82 mg/dL (ref 70–99)
Potassium: 4.5 mmol/L (ref 3.5–5.1)
Sodium: 143 mmol/L (ref 135–145)

## 2023-12-15 LAB — ECHOCARDIOGRAM COMPLETE
AR max vel: 2.21 cm2
AV Area VTI: 2.65 cm2
AV Area mean vel: 2.32 cm2
AV Mean grad: 2 mmHg
AV Peak grad: 4.2 mmHg
Ao pk vel: 1.03 m/s
Area-P 1/2: 2.9 cm2
Calc EF: 46.4 %
MV VTI: 3.26 cm2
S' Lateral: 3.5 cm
Single Plane A2C EF: 46.4 %
Single Plane A4C EF: 49.7 %

## 2023-12-15 LAB — BRAIN NATRIURETIC PEPTIDE: B Natriuretic Peptide: 355.2 pg/mL — ABNORMAL HIGH (ref 0.0–100.0)

## 2023-12-15 MED ORDER — SPIRONOLACTONE 25 MG PO TABS: 12.5000 mg | ORAL_TABLET | Freq: Every day | ORAL | 3 refills | Status: DC

## 2023-12-15 NOTE — Progress Notes (Signed)
  Echocardiogram 2D Echocardiogram has been performed.  Ocie Doyne RDCS 12/15/2023, 11:59 AM

## 2023-12-15 NOTE — Patient Instructions (Signed)
 START Spironolactone 12.5 mg ( 1/2 Tab) daily.  Labs done today, your results will be available in MyChart, we will contact you for abnormal readings.  Repeat blood work in 10 days at [facility].  Your physician recommends that you schedule a follow-up appointment in: 2 months.  If you have any questions or concerns before your next appointment please send [place] a message through [place] or call our office at [phone].    TO LEAVE A MESSAGE FOR THE NURSE SELECT OPTION 2, PLEASE LEAVE A MESSAGE INCLUDING: YOUR NAME DATE OF BIRTH CALL BACK NUMBER REASON FOR CALL**this is important as we prioritize the call backs  YOU WILL RECEIVE A CALL BACK THE SAME DAY AS LONG AS YOU CALL BEFORE 4:00 PM  At the [facility], you and your health needs are our priority. As part of our continuing mission to provide you with exceptional heart care, we have created designated Provider Care Teams. These Care Teams include your primary Cardiologist (physician) and Advanced Practice Providers (APPs- Physician Assistants and Nurse Practitioners) who all work together to provide you with the care you need, when you need it.   You may see any of the following providers on your designated Care Team at your next follow up: Dr Arvilla Meres Dr [name] Dr. Dorthula Nettles Dr. Clearnce Hasten Amy Filbert Schilder, NP Robbie Lis, [place] [facility] [place], [place] Brynda Peon, NP [place] Lee, NP Clarisa Kindred, NP Karle Plumber, PharmD Enos Fling, PharmD   Please be sure to bring in all your medications bottles to every appointment.    Thank you for choosing [facility]

## 2023-12-16 ENCOUNTER — Encounter (HOSPITAL_COMMUNITY)
Admission: RE | Admit: 2023-12-16 | Discharge: 2023-12-16 | Disposition: A | Discharge status: Home / Self Care | Payer: Medicare Other | Source: Ambulatory Visit | Attending: Cardiology | Admitting: Cardiology

## 2023-12-16 ENCOUNTER — Encounter (HOSPITAL_COMMUNITY): Payer: Self-pay | Admitting: *Deleted

## 2023-12-16 ENCOUNTER — Encounter (HOSPITAL_COMMUNITY): Payer: Medicare Other

## 2023-12-16 DIAGNOSIS — I5022 Chronic systolic (congestive) heart failure: Secondary | ICD-10-CM

## 2023-12-16 NOTE — Progress Notes (Signed)
 PCP: Dr. Lodema Hong Cardiology: Dr. Johney Frame HF Cardiology: Dr. Shirlee Latch  78 y.o. returns for followup of CHF.   Dr. Johney Frame has followed her for a short RP SVT.  This has been quiescent recently on Toprol XL.  No palpitations.   Patient was initially found to have low EF in 2014. Repeat echo in 6/17 showed EF 35-40%. Cardiolite in 8/17 showed fixed apical defect but no evidence for ischemia. She was admitted in 1/18 with PNA, DVT, and CHF exacerbation.  Echo at that time showed EF down to 20-25%.  Troponin was mildly elevated to around 0.6. She was diuresed and discharged.   Patient was diagnosed with breast cancer in 2008 and had lumpectomy, radiation, and chemotherapy which included epirubicin, and anthracycline.  Recurrent cancer in 2019 with right mastectomy and chemo with cyclophosphamide, MTX, and fluorouracil.   She had RHC/LHC in 10/18 showing no significant CAD and mildly elevate PCWP.  Cardiac MRI in 11/18 showed EF 49%, no LGE.   Echo in 5/21 showed EF 45%, global hypokinesis, mildly decreased RV systolic function.  Echo in 7/22 showed EF 55% with normal RV and IVC.    Patient was in atrial fibrillation with RVR in 6/23, converted back to NSR with amiodarone.   Echo in 7/23 showed EF 40-45%, D-shaped septum, moderate RV dysfunction with mild RV enlargement, IVC normal.   She has been hospitalized for aspiration PNA, most recently in 9/24.   Follow up 10/24, in atrial fibrillation and mildly volume overloaded. Lasix increased to 40 daily, Farxiga started and arranged for repeat echo and DCCV.   Echo 11/24 showed EF 30-35%, RV mildly reduced.  DCCV 08/06/23 to NSR   Echo was done today and reviewed, EF is in the 40% range with normal RV, IVC normal.   Today she returns for HF follow up with her husband. Weight down 3 lbs.  No dyspnea now with walking up to a mile.  No chest pain.  No palpitations, she remains in NSR.  No lightheadedness.  She is doing cardiac rehab and feels like it  has helped.   ECG (personally reviewed): NSR, IVCD 136 msec  Labs (7/18): K 4.3, creatinine 1.16, TSH normal, LDL 101 Labs (8/18): K 5.3, creatinine 1.45 Labs (10/18): K 4.3, creatinine 1.26 Labs (2/21): K 3.6, creatinine 1.45, BNP 239 Labs (7/21): K 4.9, creatinine 1.47, LDL 119 Labs (11/21): K 4, creatinine 1.38, LDL 89 Labs (4/22): K 4.2, creatinine 1.24 Labs (9/22): LDL 84 Labs (10/22): K 4, creatinine 1.45 Labs (10/24): K 4.3, creatinine 1.95, AST 54, ALT 46, hgb 9.8, BNP 392 Labs (1/25): K 4.3, creatinine 1.81  PMH: 1. SVT: Short R-P SVT, followed by Dr. Johney Frame.  2. DVT in 1/18 in setting of PNA.  She was on Xarelto for about 6 months.  3. Breast cancer: Diagnosed on right in 2008.  She had lumpectomy, radiation, and chemotherapy.  She had fluorouracil, epirubicin, Cytoxan.  - Recent mammogram with right breast calcifications => biopsy with complex sclerosing lesion.  - Recurrent cancer 2019, she had right mastectomy and chemotherapy with cyclophosphamide, MTX, fluorouracil.  4. GERD 5. Hyperlipidemia 6. Chronic systolic CHF: Uncertain cause of cardiomyopathy.  Echo in 2014 showed EF 40%.  It is possible that it is related to epirubicin use (anthracycline).  - Echo (6/17): EF 35-40%, mild LV dilation.  - Cardiolite (8/17): no ischemia, apical lateral/apical fixed perfusion defect.  - Echo (1/18): EF 20-25%, moderate MR, severe TR, D-shaped IV septum, PASP 35 mmHg.  -  RHC/LHC (10/18): No angiographic CAD.  Mean RA 5, PA 44/11 mean 26, mean PCWP 18, CI 3.49.  - Cardiac MRI (11/18): EF 49%, diffuse mild hypokinesis, mild RV dilation with normal function, no LGE.  - Echo (5/21): EF 45%, global hypokinesis, mildly decreased RV systolic function.  - Echo (7/22): EF 55% with normal RV and IVC. - Echo (7/23): EF 40-45%, D-shaped septum, moderate RV dysfunction with mild RV enlargement, IVC normal.  - Echo (11/24): EF 30-35%, RV systolic function reduced, moderate MR and moderate to  severe TR - Echo (3/25): EF 40%, normal RV, IVC normal 7. Atrial fibrillation: First noted in 6/23. S/p DCCV 11/24. 8. Meningioma: Resection in 6/23.  9. Aspiration PNA x 3 episodes. 10. CKD stage 3  FH: Colon cancer.  No known heart disease.   SH: Married, never smoked.  Lives in [place].  Retired Engineer, civil (consulting).    ROS: All systems reviewed and negative except as per HPI.   Current Outpatient Medications  Medication Sig Dispense Refill   acetaminophen (TYLENOL) 500 MG tablet Take 1,000 mg by mouth as needed.     amiodarone (PACERONE) 200 MG tablet Take 1 tablet (200 mg total) by mouth daily. 30 tablet 6   apixaban (ELIQUIS) 5 MG TABS tablet Take 1 tablet (5 mg total) by mouth 2 (two) times daily. 60 tablet 6   bisacodyl 5 MG EC tablet One tablet every 3 days, if needed, for constipation 30 tablet 1   clonazePAM (KLONOPIN) 0.5 MG tablet Take 1 tablet (0.5 mg total) by mouth at bedtime. 30 tablet 5   docusate sodium (COLACE) 100 MG capsule Take 1 capsule (100 mg total) by mouth 2 (two) times daily.     FARXIGA 10 MG TABS tablet Take 1 tablet (10 mg total) by mouth daily before breakfast. 90 tablet 3   fluticasone (FLONASE) 50 MCG/ACT nasal spray Place 2 sprays into both nostrils daily. 48 g 3   furosemide (LASIX) 20 MG tablet Take 1 tablet (20 mg total) by mouth daily. 30 tablet 11   hydrocortisone (ANUSOL-HC) 2.5 % rectal cream APPLY RECTALLY TWICE DAILY. 30 g 3   hydrocortisone (ANUSOL-HC) 25 MG suppository Place 1 suppository (25 mg total) rectally 2 (two) times daily. 12 suppository 3   hydrOXYzine (ATARAX) 10 MG tablet Take one tablet at bedtime for itching 30 tablet 3   lacosamide (VIMPAT) 50 MG TABS tablet Take 1 tablet (50 mg total) by mouth 2 (two) times daily. 180 tablet 2   levETIRAcetam (KEPPRA) 500 MG tablet Take 1 tablet (500 mg total) by mouth 2 (two) times daily. 180 tablet 3   montelukast (SINGULAIR) 10 MG tablet Take 1 tablet (10 mg total) by mouth at bedtime. 90 tablet 3    pantoprazole (PROTONIX) 40 MG tablet TAKE (1) TABLET BY MOUTH TWICE DAILY. 60 tablet 0   polyethylene glycol (MIRALAX) 17 g packet Take one packet mira lax once daily in 8 ounces water 28 each 5   potassium chloride SA (KLOR-CON M) 20 MEQ tablet Take 1 tablet (20 mEq total) by mouth daily. 30 tablet 11   sertraline (ZOLOFT) 25 MG tablet TAKE 1 TABLET BY MOUTH ONCE A DAY. 30 tablet 0   spironolactone (ALDACTONE) 25 MG tablet Take 0.5 tablets (12.5 mg total) by mouth daily. 45 tablet 3   No current facility-administered medications for this encounter.   Wt Readings from Last 3 Encounters:  12/15/23 65.3 kg (144 lb)  10/29/23 65.8 kg (145 lb 1.6 oz)  09/15/23 65.9 kg (145 lb 4.8 oz)   BP 104/70   Pulse (!) 58   Wt 65.3 kg (144 lb)   SpO2 95%   BMI 29.84 kg/m  General: NAD Neck: No JVD, no thyromegaly or thyroid nodule.  Lungs: Clear to auscultation bilaterally with normal respiratory effort. CV: Nondisplaced PMI.  Heart regular S1/S2, no S3/S4, no murmur.  No peripheral edema.  No carotid bruit.  Normal pedal pulses.  Abdomen: Soft, nontender, no hepatosplenomegaly, no distention.  Skin: Intact without lesions or rashes.  Neurologic: Alert and oriented x 3.  Psych: Normal affect. Extremities: No clubbing or cyanosis.  HEENT: Normal.   Assessment/Plan: 1. Chronic systolic CHF: Long-standing cardiomyopathy, since at least 2014.  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  Could be due to epirubicin exposure with 2008 breast cancer treatment.  Cardiac MRI in 11/18 showed EF up to 49% with no LGE.  Echo in 5/21 showed that EF remains in the 45% range.  Echo in 7/22 showed EF up to 55%.  Echo in 7/23 showed EF 40-45%, D-shaped septum, moderate RV dysfunction with mild RV enlargement, IVC normal.  Back in atrial fibrillation 10/24, echo 11/24 showed EF 30-35%. ? If reduction in EF due to atrial fibrillation. Once back in NSR, echo in 3/25 shows EF about 40%.  NYHA class II  symptoms, she is not volume overloaded on exam.  Low BP and CKD has limited GDMT.  - Continue Lasix 20 mg every other day.   - Continue Farxiga 10 mg daily.  - Start spironolactone 12.5 mg daily with BMET/BNP today and BMET in 10 days.  - Continue cardiac rehab.  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   - Continue amiodarone.  3. CKD stage 3: Baseline SCr 1.8.  - Continue SGLT2i.  - BMET today. 4. Atrial fibrillation: Persistent.  S/p DC-CV 11/24. She remains in NSR on amiodarone. - Continue Eliquis 5 mg bid. No bleeding issues. - Continue amiodarone 200 mg daily.  Check LFTs and TSH, she will need regular eye exam.  5. Elevated LFTs: Mild, stable. ? Due to CHF, ? due to amiodarone.  - Need to follow LFTs.   Follow up in 2 months with APP.   I spent 31 minutes reviewing records, interviewing/examining patient, and managing orders.  [name]  12/16/2023

## 2023-12-16 NOTE — Progress Notes (Signed)
 Cardiac Individual Treatment Plan  Patient Details  Name: Tara [name] MRN: [id number] Date of Birth: [birth year] Referring Provider:   Flowsheet Row CARDIAC REHAB PHASE II ORIENTATION from 10/29/2023 in [facility] CARDIAC REHABILITATION  Referring Provider [name] MD       Initial Encounter Date:  Flowsheet Row CARDIAC REHAB PHASE II ORIENTATION from 10/29/2023 in [place] CARDIAC REHABILITATION  Date 10/29/23       Visit Diagnosis: Chronic systolic CHF (congestive heart failure) (HCC)  Patient's Home Medications on Admission:  Current Outpatient Medications:    acetaminophen (TYLENOL) 500 MG tablet, Take 1,000 mg by mouth as needed., Disp: , Rfl:    amiodarone (PACERONE) 200 MG tablet, Take 1 tablet (200 mg total) by mouth daily., Disp: 30 tablet, Rfl: 6   apixaban (ELIQUIS) 5 MG TABS tablet, Take 1 tablet (5 mg total) by mouth 2 (two) times daily., Disp: 60 tablet, Rfl: 6   bisacodyl 5 MG EC tablet, One tablet every 3 days, if needed, for constipation, Disp: 30 tablet, Rfl: 1   clonazePAM (KLONOPIN) 0.5 MG tablet, Take 1 tablet (0.5 mg total) by mouth at bedtime., Disp: 30 tablet, Rfl: 5   docusate sodium (COLACE) 100 MG capsule, Take 1 capsule (100 mg total) by mouth 2 (two) times daily., Disp: , Rfl:    FARXIGA 10 MG TABS tablet, Take 1 tablet (10 mg total) by mouth daily before breakfast., Disp: 90 tablet, Rfl: 3   fluticasone (FLONASE) 50 MCG/ACT nasal spray, Place 2 sprays into both nostrils daily., Disp: 48 g, Rfl: 3   furosemide (LASIX) 20 MG tablet, Take 1 tablet (20 mg total) by mouth daily., Disp: 30 tablet, Rfl: 11   hydrocortisone (ANUSOL-HC) 2.5 % rectal cream, APPLY RECTALLY TWICE DAILY., Disp: 30 g, Rfl: 3   hydrocortisone (ANUSOL-HC) 25 MG suppository, Place 1 suppository (25 mg total) rectally 2 (two) times daily., Disp: 12 suppository, Rfl: 3   hydrOXYzine (ATARAX) 10 MG tablet, Take one tablet at bedtime for itching, Disp: 30 tablet, Rfl: 3   lacosamide  (VIMPAT) 50 MG TABS tablet, Take 1 tablet (50 mg total) by mouth 2 (two) times daily., Disp: 180 tablet, Rfl: 2   levETIRAcetam (KEPPRA) 500 MG tablet, Take 1 tablet (500 mg total) by mouth 2 (two) times daily., Disp: 180 tablet, Rfl: 3   montelukast (SINGULAIR) 10 MG tablet, Take 1 tablet (10 mg total) by mouth at bedtime., Disp: 90 tablet, Rfl: 3   pantoprazole (PROTONIX) 40 MG tablet, TAKE (1) TABLET BY MOUTH TWICE DAILY., Disp: 60 tablet, Rfl: 0   polyethylene glycol (MIRALAX) 17 g packet, Take one packet mira lax once daily in 8 ounces water, Disp: 28 each, Rfl: 5   potassium chloride SA (KLOR-CON M) 20 MEQ tablet, Take 1 tablet (20 mEq total) by mouth daily., Disp: 30 tablet, Rfl: 11   sertraline (ZOLOFT) 25 MG tablet, TAKE 1 TABLET BY MOUTH ONCE A DAY., Disp: 30 tablet, Rfl: 0   spironolactone (ALDACTONE) 25 MG tablet, Take 0.5 tablets (12.5 mg total) by mouth daily., Disp: 45 tablet, Rfl: 3  Past Medical History: 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   Angio-edema    Anxiety    Breast cancer (HCC) 2008   right - s/p lumpectomy->chemo, radiation   Depression    Dysrhythmia    hx SVT   Family history of colon cancer    Family history of prostate cancer  GERD (gastroesophageal reflux disease)    H/O: hysterectomy    Hiatal hernia    History of cancer chemotherapy    History of radiation therapy    Hyperlipidemia    Hypertension 01/25/2018   Nonischemic cardiomyopathy (HCC)    Personal history of radiation therapy 01/05/209   SVT (supraventricular tachycardia) (HCC)    short RP SVT documented 5/14   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HCC)    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)   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    Tobacco Use: Social History   Tobacco Use  Smoking Status Never  Smokeless Tobacco Never    Labs: Review Flowsheet   More data exists      Latest Ref Rng & Units 04/18/2022 12/31/2022 06/03/2023 06/24/2023 12/15/2023  Labs for ITP Cardiac and Pulmonary Rehab  Cholestrol 0 - 200 mg/dL 73  161  - - 096   LDL (calc) 0 - 99 mg/dL 38  94  - - 045   HDL-C >40 mg/dL 18  69  - - 75   Trlycerides <150 mg/dL 85  81  - - 55   Hemoglobin A1c 4.8 - 5.6 % 5.9  6.1  6.2  - -  TCO2 22 - 32 mmol/L - - - 18  -    Capillary Blood Glucose: Lab Results  Component Value Date   GLUCAP 86 06/22/2023   GLUCAP 91 06/21/2023   GLUCAP 78 09/22/2022   GLUCAP 91 09/22/2022   GLUCAP 114 (H) 09/22/2022     Exercise Target Goals: Exercise Program Goal: Individual exercise prescription set using results from initial 6 min walk test and THRR while considering  patient's activity barriers and safety.   Exercise Prescription Goal: Starting with aerobic activity 30 plus minutes a day, 3 days per week for initial exercise prescription. Provide home exercise prescription and guidelines that participant acknowledges understanding prior to discharge.  Activity Barriers & Risk Stratification:  Activity Barriers & Cardiac Risk Stratification - 10/29/23 1315       Activity Barriers & Cardiac Risk Stratification   Activity Barriers Left Knee Replacement;Deconditioning;Shortness of Breath;Balance Concerns;Muscular Weakness    Cardiac Risk Stratification Moderate             6 Minute Walk:  6 Minute Walk     Row Name 10/29/23 1450         6 Minute Walk   Phase Initial     Distance 720 feet     Walk Time 6 minutes     # of Rest Breaks 0     MPH 1.36     METS 1.55     RPE 9     VO2 Peak 5.43     Symptoms No     Resting HR 59 bpm     Resting BP 106/54     Resting Oxygen Saturation  95 %     Exercise Oxygen Saturation  during 6 min walk 88 %     Max Ex. HR 119 bpm     Max Ex. BP 148/74     2 Minute Post BP 106/62              Oxygen Initial Assessment:   Oxygen Re-Evaluation:   Oxygen Discharge (Final  Oxygen Re-Evaluation):   Initial Exercise Prescription:  Initial Exercise Prescription - 10/29/23 1400       Date of Initial Exercise  RX and Referring Provider   Date 10/29/23    Referring Provider [name] MD      Oxygen   Maintain Oxygen Saturation 88% or higher      NuStep   Level 1    SPM 80    Minutes 15    METs 2      Track   Laps 10    Minutes 15    METs 2      Prescription Details   Frequency (times per week) 2    Duration Progress to 30 minutes of continuous aerobic without signs/symptoms of physical distress      Intensity   THRR 40-80% of Max Heartrate 93-126    Ratings of Perceived Exertion 11-13    Perceived Dyspnea 0-4      Progression   Progression Continue to progress workloads to maintain intensity without signs/symptoms of physical distress.      Resistance Training   Training Prescription Yes    Weight 2 lb    Reps 10-15             Perform Capillary Blood Glucose checks as needed.  Exercise Prescription Changes:   Exercise Prescription Changes     Row Name 10/29/23 1400 11/13/23 1500 11/23/23 1200 12/07/23 1500       Response to Exercise   Blood Pressure (Admit) 106/54 106/60 102/60 100/58    Blood Pressure (Exercise) 148/74 138/58 112/60 138/60    Blood Pressure (Exit) 106/62 98/50 102/62 106/60    Heart Rate (Admit) 59 bpm 70 bpm 63 bpm 61 bpm    Heart Rate (Exercise) 119 bpm 96 bpm 100 bpm 87 bpm    Heart Rate (Exit) 64 bpm 95 bpm 64 bpm 62 bpm    Oxygen Saturation (Admit) 95 % -- -- --    Oxygen Saturation (Exercise) 88 % -- -- --    Rating of Perceived Exertion (Exercise) 9 11 12 11     Symptoms none -- -- --    Comments walk test results -- -- --    Duration -- Continue with 30 min of aerobic exercise without signs/symptoms of physical distress. Continue with 30 min of aerobic exercise without signs/symptoms of physical distress. Continue with 30 min of aerobic exercise without signs/symptoms of physical distress.     Intensity -- THRR unchanged THRR unchanged THRR unchanged      Progression   Progression -- Continue to progress workloads to maintain intensity without signs/symptoms of physical distress. Continue to progress workloads to maintain intensity without signs/symptoms of physical distress. Continue to progress workloads to maintain intensity without signs/symptoms of physical distress.      Resistance Training   Training Prescription -- Yes Yes Yes    Weight -- 2 2 2     Reps -- 10-15 10-15 10-15      NuStep   Level -- 2 2 3     SPM -- 41 34 41    Minutes -- 15 15 15     METs -- 1.5 1.3 1.4      Track   Laps -- 24 27 18     Minutes -- 15 15 15     METs -- 2.3 2.17 2.03      Oxygen   Maintain Oxygen Saturation -- -- 88% or higher 88% or higher             Exercise Comments:   Exercise Comments     Row Name 10/29/23 1507 11/02/23 1429  Exercise Comments Tara [name] restarted rehab today.  She was able to complete versus Nustep Test. First full day of exercise!  Patient was oriented to gym and equipment including functions, settings, policies, and procedures.  Patient's individual exercise prescription and treatment plan were reviewed.  All starting workloads were established based on the results of the 6 minute walk test done at initial orientation visit.  The plan for exercise progression was also introduced and progression will be customized based on patient's performance and goals.               Exercise Goals and Review:   Exercise Goals     Row Name 10/29/23 1457             Exercise Goals   Increase Physical Activity Yes       Intervention Provide advice, education, support and counseling about physical activity/exercise needs.;Develop an individualized exercise prescription for aerobic and resistive training based on initial evaluation findings, risk stratification, comorbidities and participant's personal goals.       Expected Outcomes Short Term:  Attend rehab on a regular basis to increase amount of physical activity.;Long Term: Add in home exercise to make exercise part of routine and to increase amount of physical activity.;Long Term: Exercising regularly at least 3-5 days a week.       Increase Strength and Stamina Yes       Intervention Provide advice, education, support and counseling about physical activity/exercise needs.;Develop an individualized exercise prescription for aerobic and resistive training based on initial evaluation findings, risk stratification, comorbidities and participant's personal goals.       Expected Outcomes Short Term: Increase workloads from initial exercise prescription for resistance, speed, and METs.;Short Term: Perform resistance training exercises routinely during rehab and add in resistance training at home;Long Term: Improve cardiorespiratory fitness, muscular endurance and strength as measured by increased METs and functional capacity ( )       Able to understand and use rate of perceived exertion (RPE) scale Yes       Intervention Provide education and explanation on how to use RPE scale       Expected Outcomes Short Term: Able to use RPE daily in rehab to express subjective intensity level;Long Term:  Able to use RPE to guide intensity level when exercising independently       Able to understand and use Dyspnea scale Yes       Intervention Provide education and explanation on how to use Dyspnea scale       Expected Outcomes Short Term: Able to use Dyspnea scale daily in rehab to express subjective sense of shortness of breath during exertion;Long Term: Able to use Dyspnea scale to guide intensity level when exercising independently       Knowledge and understanding of Target Heart Rate Range (THRR) Yes       Intervention Provide education and explanation of THRR including how the numbers were predicted and where they are located for reference       Expected Outcomes Short Term: Able to state/look up  THRR;Long Term: Able to use THRR to govern intensity when exercising independently;Short Term: Able to use daily as guideline for intensity in rehab       Able to check pulse independently Yes       Intervention Provide education and demonstration on how to check pulse in carotid and radial arteries.;Review the importance of being able to check your own pulse for safety during independent exercise  Expected Outcomes Short Term: Able to explain why pulse checking is important during independent exercise;Long Term: Able to check pulse independently and accurately       Understanding of Exercise Prescription Yes       Intervention Provide education, explanation, and written materials on patient's individual exercise prescription       Expected Outcomes Short Term: Able to explain program exercise prescription;Long Term: Able to explain home exercise prescription to exercise independently                Exercise Goals Re-Evaluation :  Exercise Goals Re-Evaluation     Row Name 11/02/23 1429 11/16/23 0932 12/07/23 1502         Exercise Goal Re-Evaluation   Exercise Goals Review -- Increase Physical Activity;Increase Strength and Stamina;Understanding of Exercise Prescription Increase Physical Activity;Increase Strength and Stamina;Understanding of Exercise Prescription     Comments Reviewed RPE and dyspnea scale, THR and program prescription with pt today.  Pt voiced understanding and was given a copy of goals to take home. Tara [name] is doing well i rehab and is tolerating exericse well. She is getting stronger and walking more laps each visit. Will continue to monitor and progress asable. Tara [name] is doing well in rehab.  She is feeling good weith exercise in rehab.  She is walking around the house more and getting up and down more.  She is now going to get things for herself.  We will review home exercise guidelines with her soon.     Expected Outcomes Short: Use RPE daily to regulate  intensity.  Long: Follow program prescription in THR. Continue to attend rehab Short Review home exercise guidelines Long; Continue to improve stamina               Discharge Exercise Prescription (Final Exercise Prescription Changes):  Exercise Prescription Changes - 12/07/23 1500       Response to Exercise   Blood Pressure (Admit) 100/58    Blood Pressure (Exercise) 138/60    Blood Pressure (Exit) 106/60    Heart Rate (Admit) 61 bpm    Heart Rate (Exercise) 87 bpm    Heart Rate (Exit) 62 bpm    Rating of Perceived Exertion (Exercise) 11    Duration Continue with 30 min of aerobic exercise without signs/symptoms of physical distress.    Intensity THRR unchanged      Progression   Progression Continue to progress workloads to maintain intensity without signs/symptoms of physical distress.      Resistance Training   Training Prescription Yes    Weight 2    Reps 10-15      NuStep   Level 3    SPM 41    Minutes 15    METs 1.4      Track   Laps 18    Minutes 15    METs 2.03      Oxygen   Maintain Oxygen Saturation 88% or higher             Nutrition:  Target Goals: Understanding of nutrition guidelines, daily intake of sodium 1500mg , cholesterol 200mg , calories 30% from fat and 7% or less from saturated fats, daily to have 5 or more servings of fruits and vegetables.  Biometrics:  Pre Biometrics - 10/29/23 1458       Pre Biometrics   Height 4' 10.25" (1.48 m)    Weight 145 lb 1.6 oz (65.8 kg)    Waist Circumference 32 inches    Hip Circumference 40  inches    Waist to Hip Ratio 0.8 %    BMI (Calculated) 30.05    Grip Strength 22 kg    Single Leg Stand 0 seconds              Nutrition Therapy Plan and Nutrition Goals:  Nutrition Therapy & Goals - 10/29/23 1319       Intervention Plan   Intervention Prescribe, educate and counsel regarding individualized specific dietary modifications aiming towards targeted core components such as weight,  hypertension, lipid management, diabetes, heart failure and other comorbidities.;Nutrition handout(s) given to patient.    Expected Outcomes Short Term Goal: Understand basic principles of dietary content, such as calories, fat, sodium, cholesterol and nutrients.;Long Term Goal: Adherence to prescribed nutrition plan.             Nutrition Assessments:  MEDIFICTS Score Key: >=70 Need to make dietary changes  40-70 Heart Healthy Diet <= 40 Therapeutic Level Cholesterol Diet  Flowsheet Row CARDIAC REHAB PHASE II ORIENTATION from 10/29/2023 in [facility] CARDIAC REHABILITATION  Picture Your Plate Total Score on Admission 73      Picture Your Plate Scores: <62 Unhealthy dietary pattern with much room for improvement. 41-50 Dietary pattern unlikely to meet recommendations for good health and room for improvement. 51-60 More healthful dietary pattern, with some room for improvement.  >60 Healthy dietary pattern, although there may be some specific behaviors that could be improved.    Nutrition Goals Re-Evaluation:  Nutrition Goals Re-Evaluation     Row Name 12/07/23 1507             Goals   Nutrition Goal Heart Healthy diet       Comment She is has been working on her diet.  She is doing her best to stick to heart healthy meals.  Since they attended nutrition education class, her husband has been making sure that she is sticking to her diet.  She is getting a lot of fruits and vegetables.       Expected Outcome Short: Stick to heart healthy diet Long: Continue to add variety                Nutrition Goals Discharge (Final Nutrition Goals Re-Evaluation):  Nutrition Goals Re-Evaluation - 12/07/23 1507       Goals   Nutrition Goal Heart Healthy diet    Comment She is has been working on her diet.  She is doing her best to stick to heart healthy meals.  Since they attended nutrition education class, her husband has been making sure that she is sticking to her diet.  She is  getting a lot of fruits and vegetables.    Expected Outcome Short: Stick to heart healthy diet Long: Continue to add variety             Psychosocial: Target Goals: Acknowledge presence or absence of significant depression and/or stress, maximize coping skills, provide positive support system. Participant is able to verbalize types and ability to use techniques and skills needed for reducing stress and depression.  Initial Review & Psychosocial Screening:  Initial Psych Review & Screening - 10/29/23 1318       Initial Review   Current issues with None Identified      Family Dynamics   Good Support System? Yes   husband, daughter, care takers     Barriers   Psychosocial barriers to participate in program The patient should benefit from training in stress management and relaxation.;There are no identifiable  barriers or psychosocial needs.      Screening Interventions   Interventions Encouraged to exercise;To provide support and resources with identified psychosocial needs;Provide feedback about the scores to participant    Expected Outcomes Short Term goal: Utilizing psychosocial counselor, staff and physician to assist with identification of specific Stressors or current issues interfering with healing process. Setting desired goal for each stressor or current issue identified.;Long Term Goal: Stressors or current issues are controlled or eliminated.;Short Term goal: Identification and review with participant of any Quality of Life or Depression concerns found by scoring the questionnaire.;Long Term goal: The participant improves quality of Life and PHQ9 Scores as seen by post scores and/or verbalization of changes             Quality of Life Scores:  Quality of Life - 10/29/23 1502       Quality of Life   Select Quality of Life      Quality of Life Scores   Health/Function Pre 25.2 %    Socioeconomic Pre 30 %    Psych/Spiritual Pre 23.14 %    Family Pre 30 %    GLOBAL  Pre 26.57 %            Scores of 19 and below usually indicate a poorer quality of life in these areas.  A difference of  2-3 points is a clinically meaningful difference.  A difference of 2-3 points in the total score of the Quality of Life Index has been associated with significant improvement in overall quality of life, self-image, physical symptoms, and general health in studies assessing change in quality of life.  PHQ-9: Review Flowsheet  More data exists      10/29/2023 08/17/2023 08/11/2023 08/05/2023 07/22/2023  Depression screen PHQ 2/9  Decreased Interest 2 1 0 0 0 0  Down, Depressed, Hopeless 0 0 0 0 0 0  PHQ - 2 Score 2 1 0 0 0 0  Altered sleeping 1 0 - - -  Tired, decreased energy 1 1 - - -  Change in appetite 1 1 - - -  Feeling bad or failure about yourself  1 0 - - -  Trouble concentrating 0 0 - - -  Moving slowly or fidgety/restless 1 0 - - -  Suicidal thoughts 0 0 - - -  PHQ-9 Score 7 3 - - -  Difficult doing work/chores Very difficult Somewhat difficult - - -    Details       Multiple values from one day are sorted in reverse-chronological order        Interpretation of Total Score  Total Score Depression Severity:  1-4 = Minimal depression, 5-9 = Mild depression, 10-14 = Moderate depression, 15-19 = Moderately severe depression, 20-27 = Severe depression   Psychosocial Evaluation and Intervention:  Psychosocial Evaluation - 10/29/23 1458       Psychosocial Evaluation & Interventions   Interventions Stress management education;Relaxation education;Encouraged to exercise with the program and follow exercise prescription    Comments Tara [name] is returning to cardiac rehab to finish program.  She called to stop previously due to not feeling well and husband was recovering from surgery.  She has paid caregivers at home that help with appts and documentations.  She denied any depression symptoms or major stressors.  Her caretaker will be bringing her to classes  each day and they do not percieve any barriers to attending.  She is walking with gait belt with family and caregiver but no longer  in wheelchair and using walker some at home.  She did her walk test today using a rollator walker for support.  Her knee and foot have been less painful recently and she wants to walk more.  Her goal is to be able to walk unassisted.  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  She sleeps well and her family keep her moving more at home.    Expected Outcomes Short: Attend program to build up stamina Long: Conitnue to work on walking and independence    Continue Psychosocial Services  Follow up required by staff             Psychosocial Re-Evaluation:  Psychosocial Re-Evaluation     Row Name 12/07/23 1505             Psychosocial Re-Evaluation   Current issues with None Identified       Comments Tara [name] is doing well in rehab. She is starting to feel better and moving around better now. She is staying more active at home and starting to get things for herself more.  She wants to conitnue to improve.  She is sleeping well for most part, but does have some rough nights.  She will get up and move around the house to find things she had forgotten about. She denies any major stressors.       Expected Outcomes Short: Conitnue to attend rehab to build strength Long; Continue to exercise for mental boost       Interventions Encouraged to attend Cardiac Rehabilitation for the exercise       Continue Psychosocial Services  Follow up required by staff                Psychosocial Discharge (Final Psychosocial Re-Evaluation):  Psychosocial Re-Evaluation - 12/07/23 1505       Psychosocial Re-Evaluation   Current issues with None Identified    Comments Tara [name] is doing well in rehab. She is starting to feel better and moving around better now. She is staying more active at home and starting to get things for herself more.  She wants to conitnue to  improve.  She is sleeping well for most part, but does have some rough nights.  She will get up and move around the house to find things she had forgotten about. She denies any major stressors.    Expected Outcomes Short: Conitnue to attend rehab to build strength Long; Continue to exercise for mental boost    Interventions Encouraged to attend Cardiac Rehabilitation for the exercise    Continue Psychosocial Services  Follow up required by staff             Vocational Rehabilitation: Provide vocational rehab assistance to qualifying candidates.   Vocational Rehab Evaluation & Intervention:  Vocational Rehab - 10/29/23 1319       Initial Vocational Rehab Evaluation & Intervention   Assessment shows need for Vocational Rehabilitation No   retired            Education: Education Goals: Education classes will be provided on a weekly basis, covering required topics. Participant will state understanding/return demonstration of topics presented.  Learning Barriers/Preferences:  Learning Barriers/Preferences - 10/29/23 1314       Learning Barriers/Preferences   Learning Barriers Sight   glasses for reading   Learning Preferences None             Education Topics: Hypertension, Hypertension Reduction -Define heart disease and high blood pressure. Discus how  high blood pressure affects the body and ways to reduce high blood pressure.   Exercise and Your Heart -Discuss why it is important to exercise, the FITT principles of exercise, normal and abnormal responses to exercise, and how to exercise safely. Flowsheet Row CARDIAC REHAB PHASE II EXERCISE from 12/09/2023 in [place] CARDIAC REHABILITATION  Date 11/04/23  Educator HB  Instruction Review Code 1- Verbalizes Understanding       Angina -Discuss definition of angina, causes of angina, treatment of angina, and how to decrease risk of having angina.   Cardiac Medications -Review what the following cardiac  medications are used for, how they affect the body, and side effects that may occur when taking the medications.  Medications include Aspirin, Beta blockers, calcium channel blockers, ACE Inhibitors, angiotensin receptor blockers, diuretics, digoxin, and antihyperlipidemics.   Congestive Heart Failure -Discuss the definition of CHF, how to live with CHF, the signs and symptoms of CHF, and how keep track of weight and sodium intake. Flowsheet Row CARDIAC REHAB PHASE II EXERCISE from 12/09/2023 in [place] CARDIAC REHABILITATION  Date 12/09/23  Educator HB  Instruction Review Code 1- Verbalizes Understanding       Heart Disease and Intimacy -Discus the effect sexual activity has on the heart, how changes occur during intimacy as we age, and safety during sexual activity. Flowsheet Row CARDIAC REHAB PHASE II EXERCISE from 08/26/2023 in [place] CARDIAC REHABILITATION  Date 08/26/23  Educator jh  Instruction Review Code 1- Verbalizes Understanding       Smoking Cessation / COPD -Discuss different methods to quit smoking, the health benefits of quitting smoking, and the definition of COPD.   Nutrition I: Fats -Discuss the types of cholesterol, what cholesterol does to the heart, and how cholesterol levels can be controlled. Flowsheet Row CARDIAC REHAB PHASE II EXERCISE from 12/09/2023 in [place] CARDIAC REHABILITATION  Date 11/25/23  Educator [facility]  Instruction Review Code 1- Verbalizes Understanding       Nutrition II: Labels -Discuss the different components of food labels and how to read food label   Heart Parts/Heart Disease and PAD -Discuss the anatomy of the heart, the pathway of blood circulation through the heart, and these are affected by heart disease.   Stress I: Signs and Symptoms -Discuss the causes of stress, how stress may lead to anxiety and depression, and ways to limit stress. Flowsheet Row CARDIAC REHAB PHASE II EXERCISE from 12/09/2023 in [place]  CARDIAC REHABILITATION  Date 12/02/23  Educator HB  Instruction Review Code 1- Verbalizes Understanding       Stress II: Relaxation -Discuss different types of relaxation techniques to limit stress. Flowsheet Row CARDIAC REHAB PHASE II EXERCISE from 12/09/2023 in [place] CARDIAC REHABILITATION  Date 11/18/23  Educator jh  Instruction Review Code 1- Verbalizes Understanding       Warning Signs of Stroke / TIA -Discuss definition of a stroke, what the signs and symptoms are of a stroke, and how to identify when someone is having stroke.   Knowledge Questionnaire Score:  Knowledge Questionnaire Score - 10/29/23 1503       Knowledge Questionnaire Score   Pre Score 18/24             Core Components/Risk Factors/Patient Goals at Admission:  Personal Goals and Risk Factors at Admission - 10/29/23 1503       Core Components/Risk Factors/Patient Goals on Admission    Weight Management Yes;Weight Loss;Obesity    Intervention Weight Management: Develop a combined  nutrition and exercise program designed to reach desired caloric intake, while maintaining appropriate intake of nutrient and fiber, sodium and fats, and appropriate energy expenditure required for the weight goal.;Weight Management: Provide education and appropriate resources to help participant work on and attain dietary goals.;Weight Management/Obesity: Establish reasonable short term and long term weight goals.;Obesity: Provide education and appropriate resources to help participant work on and attain dietary goals.    Admit Weight 145 lb 1.6 oz (65.8 kg)    Goal Weight: Short Term 140 lb (63.5 kg)    Goal Weight: Long Term 135 lb (61.2 kg)    Expected Outcomes Long Term: Adherence to nutrition and physical activity/exercise program aimed toward attainment of established weight goal;Short Term: Continue to assess and modify interventions until short term weight is achieved;Weight Loss: Understanding of general  recommendations for a balanced deficit meal plan, which promotes 1-2 lb weight loss per week and includes a negative energy balance of [phone] kcal/d;Understanding recommendations for meals to include 15-35% energy as protein, 25-35% energy from fat, 35-60% energy from carbohydrates, less than 200mg  of dietary cholesterol, 20-35 gm of total fiber daily;Understanding of distribution of calorie intake throughout the day with the consumption of 4-5 meals/snacks    Heart Failure Yes    Intervention Provide a combined exercise and nutrition program that is supplemented with education, support and counseling about heart failure. Directed toward relieving symptoms such as shortness of breath, decreased exercise tolerance, and extremity edema.    Expected Outcomes Improve functional capacity of life;Short term: Attendance in program 2-3 days a week with increased exercise capacity. Reported lower sodium intake. Reported increased fruit and vegetable intake. Reports medication compliance.;Short term: Daily weights obtained and reported for increase. Utilizing diuretic protocols set by physician.;Long term: Adoption of self-care skills and reduction of barriers for early signs and symptoms recognition and intervention leading to self-care maintenance.    Hypertension Yes    Intervention Provide education on lifestyle modifcations including regular physical activity/exercise, weight management, moderate sodium restriction and increased consumption of fresh fruit, vegetables, and low fat dairy, alcohol moderation, and smoking cessation.;Monitor prescription use compliance.    Expected Outcomes Short Term: Continued assessment and intervention until BP is < 140/2mm HG in hypertensive participants. < 130/76mm HG in hypertensive participants with diabetes, heart failure or chronic kidney disease.;Long Term: Maintenance of blood pressure at goal levels.    Lipids Yes    Intervention Provide education and support for  participant on nutrition & aerobic/resistive exercise along with prescribed medications to achieve LDL 70mg , HDL >40mg .    Expected Outcomes Long Term: Cholesterol controlled with medications as prescribed, with individualized exercise RX and with personalized nutrition plan. Value goals: LDL < 70mg , HDL > 40 mg.;Short Term: Participant states understanding of desired cholesterol values and is compliant with medications prescribed. Participant is following exercise prescription and nutrition guidelines.             Core Components/Risk Factors/Patient Goals Review:   Goals and Risk Factor Review     Row Name 12/07/23 1509             Core Components/Risk Factors/Patient Goals Review   Personal Goals Review Weight Management/Obesity;Heart Failure;Hypertension       Review Tara [name] is doing well in rehab. Her weight is staying steady and she is watching what she is eating. She denies any heart failure symptoms.  Her pressures are doing well.  They have always been on the lower side.  She does check them routinely at  home.       Expected Outcomes short: Conitnue to keep eye on blood pressure Long: Continue to work on weight loss.                Core Components/Risk Factors/Patient Goals at Discharge (Final Review):   Goals and Risk Factor Review - 12/07/23 1509       Core Components/Risk Factors/Patient Goals Review   Personal Goals Review Weight Management/Obesity;Heart Failure;Hypertension    Review Tara [name] is doing well in rehab. Her weight is staying steady and she is watching what she is eating. She denies any heart failure symptoms.  Her pressures are doing well.  They have always been on the lower side.  She does check them routinely at home.    Expected Outcomes short: Conitnue to keep eye on blood pressure Long: Continue to work on weight loss.             ITP Comments:  ITP Comments     Row Name 10/29/23 1445 11/02/23 1428 11/18/23 0854 12/16/23 1014     ITP  Comments Patient attend orientation today.  Patient is attending Cardiac Rehabilitation Program.  Documentation for diagnosis can be found in [facility] 09/04/23.  Reviewed medical chart, RPE/RPD, gym safety, and program guidelines.  Patient was fitted to equipment they will be using during rehab.  Patient is scheduled to start exercise on Monday 11/02/23 at 1430.   Initial ITP created and sent for review and signature by Dr. Dina Rich, Medical Director for Cardiac Rehabilitation Program.  Pt is returning to rehab from previous sessions that she had cancelled in December.  Today, is session 4 and she will finish her 36 sessions. First full day of exercise!  Patient was oriented to gym and equipment including functions, settings, policies, and procedures.  Patient's individual exercise prescription and treatment plan were reviewed.  All starting workloads were established based on the results of the 6 minute walk test done at initial orientation visit.  The plan for exercise progression was also introduced and progression will be customized based on patient's performance and goals. 30 day review completed. ITP sent to Dr. Dina Rich, Medical Director of Cardiac Rehab. Continue with ITP unless changes are made by physician.    Still new to program. 30 day review completed. ITP sent to Dr. Dina Rich, Medical Director of Cardiac Rehab. Continue with ITP unless changes are made by physician.             Comments: 30 day review

## 2023-12-16 NOTE — Progress Notes (Signed)
 Daily Session Note  Patient Details  Name: Tara [name] MRN: [id number] Date of Birth: 1946/03/24 Referring Provider:   Flowsheet Row CARDIAC REHAB PHASE II ORIENTATION from 10/29/2023 in [facility] CARDIAC REHABILITATION  Referring Provider [name] MD       Encounter Date: 12/16/2023  Check In:  Session Check In - 12/16/23 1400       Check-In   Supervising physician immediately available to respond to emergencies See telemetry face sheet for immediately available MD    Location [facility]    Staff Present Ross Ludwig, BS, Exercise Physiologist;Debra Laural Benes, RN, BSN    Virtual Visit No    Medication changes reported     No    Fall or balance concerns reported    No    Tobacco Cessation No Change    Warm-up and Cool-down Performed on first and last piece of equipment    Resistance Training Performed Yes    VAD Patient? No    PAD/SET Patient? No      Pain Assessment   Currently in Pain? No/denies    Multiple Pain Sites No             Capillary Blood Glucose: No results found. However, due to the size of the patient record, not all encounters were searched. Please check Results Review for a complete set of results.    Social History   Tobacco Use  Smoking Status Never  Smokeless Tobacco Never    Goals Met:  Independence with exercise equipment Exercise tolerated well No report of concerns or symptoms today Strength training completed today  Goals Unmet:  Not Applicable  Comments: Pt able to follow exercise prescription today without complaint.  Will continue to monitor for progression.

## 2023-12-18 ENCOUNTER — Telehealth (HOSPITAL_COMMUNITY): Payer: Self-pay

## 2023-12-18 DIAGNOSIS — I5022 Chronic systolic (congestive) heart failure: Secondary | ICD-10-CM

## 2023-12-18 MED ORDER — ROSUVASTATIN CALCIUM 10 MG PO TABS
10.0000 mg | ORAL_TABLET | Freq: Every day | ORAL | 3 refills | Status: AC
Start: 2023-12-18 — End: 2025-04-13

## 2023-12-18 NOTE — Telephone Encounter (Signed)
-----   Message from [name] sent at 12/15/2023  4:58 PM EDT ----- LDL is very high.  Would add Crestor 10 mg daily, lipids/LFTs in 2 months.

## 2023-12-18 NOTE — Telephone Encounter (Signed)
 Patient's Crestor medication has been sent to pt's pharmacy also pt's labs placed appointment scheduled. In addition, Pt aware, agreeable, and verbalized understanding.

## 2023-12-21 ENCOUNTER — Encounter (HOSPITAL_COMMUNITY)
Admission: RE | Admit: 2023-12-21 | Discharge: 2023-12-21 | Disposition: A | Discharge status: Home / Self Care | Payer: Medicare Other | Source: Ambulatory Visit | Attending: Cardiology | Admitting: Cardiology

## 2023-12-21 ENCOUNTER — Encounter (HOSPITAL_COMMUNITY): Payer: Medicare Other

## 2023-12-21 DIAGNOSIS — I5022 Chronic systolic (congestive) heart failure: Secondary | ICD-10-CM

## 2023-12-21 NOTE — Progress Notes (Signed)
 Daily Session Note  Patient Details  Name: JEZABELLE CHISOLM MRN: [id number] Date of Birth: December 25, 1945 Referring Provider:   Flowsheet Row CARDIAC REHAB PHASE II ORIENTATION from 10/29/2023 in [facility] CARDIAC REHABILITATION  Referring Provider [name] MD       Encounter Date: 12/21/2023  Check In:  Session Check In - 12/21/23 1430       Check-In   Supervising physician immediately available to respond to emergencies See telemetry face sheet for immediately available MD    Location [facility]    Staff Present Ross Ludwig, BS, Exercise Physiologist;Mertie Haslem, RN;Jessica Stuart, MA, RCEP, CCRP, Dow Adolph, RN, BSN    Virtual Visit No    Medication changes reported     No    Fall or balance concerns reported    No    Warm-up and Cool-down Performed on first and last piece of equipment    Resistance Training Performed Yes    VAD Patient? No    PAD/SET Patient? No      Pain Assessment   Currently in Pain? No/denies    Multiple Pain Sites No             Capillary Blood Glucose: No results found. However, due to the size of the patient record, not all encounters were searched. Please check Results Review for a complete set of results.    Social History   Tobacco Use  Smoking Status Never  Smokeless Tobacco Never    Goals Met:  Independence with exercise equipment Exercise tolerated well No report of concerns or symptoms today Strength training completed today  Goals Unmet:  Not Applicable  Comments: Pt able to follow exercise prescription today without complaint.  Will continue to monitor for progression.

## 2023-12-23 ENCOUNTER — Encounter (HOSPITAL_COMMUNITY)
Admission: RE | Admit: 2023-12-23 | Discharge: 2023-12-23 | Disposition: A | Discharge status: Home / Self Care | Payer: Medicare Other | Source: Ambulatory Visit | Attending: Cardiology | Admitting: Cardiology

## 2023-12-23 DIAGNOSIS — I5022 Chronic systolic (congestive) heart failure: Secondary | ICD-10-CM | POA: Diagnosis not present

## 2023-12-23 NOTE — Progress Notes (Signed)
 Daily Session Note  Patient Details  Name: VEDIKA DUMLAO MRN: [id number] Date of Birth: [birth year] Referring Provider:   Flowsheet Row CARDIAC REHAB PHASE II ORIENTATION from 10/29/2023 in [facility] CARDIAC REHABILITATION  Referring Provider [name] MD       Encounter Date: 12/23/2023  Check In:  Session Check In - 12/23/23 1412       Check-In   Supervising physician immediately available to respond to emergencies See telemetry face sheet for immediately available MD    Location [facility]    Staff Present Desiree Lucy, BSN, RN, WTA-C;Hillary Troutman BSN, RN;Heather Fredric Mare, BS, Exercise Physiologist;Jessica Portal, MA, RCEP, CCRP, CCET    Virtual Visit No    Medication changes reported     No    Fall or balance concerns reported    No    Tobacco Cessation No Change    Warm-up and Cool-down Performed on first and last piece of equipment    Resistance Training Performed Yes    VAD Patient? No    PAD/SET Patient? No      Pain Assessment   Currently in Pain? No/denies             Capillary Blood Glucose: No results found. However, due to the size of the patient record, not all encounters were searched. Please check Results Review for a complete set of results.    Social History   Tobacco Use  Smoking Status Never  Smokeless Tobacco Never    Goals Met:  Independence with exercise equipment No report of concerns or symptoms today Strength training completed today  Goals Unmet:  Not Applicable  Comments: Pt able to follow exercise prescription today without complaint.  Will continue to monitor for progression.

## 2023-12-28 ENCOUNTER — Other Ambulatory Visit: Payer: Self-pay | Admitting: Family Medicine

## 2023-12-28 ENCOUNTER — Encounter (HOSPITAL_COMMUNITY)
Admission: RE | Admit: 2023-12-28 | Discharge: 2023-12-28 | Disposition: A | Discharge status: Home / Self Care | Payer: Medicare Other | Source: Ambulatory Visit | Attending: Cardiology | Admitting: Cardiology

## 2023-12-28 DIAGNOSIS — I5022 Chronic systolic (congestive) heart failure: Secondary | ICD-10-CM | POA: Diagnosis not present

## 2023-12-28 NOTE — Progress Notes (Signed)
 Daily Session Note  Patient Details  Name: Tara [name] MRN: [id number] Date of Birth: 04/23/1946 Referring Provider:   Flowsheet Row CARDIAC REHAB PHASE II ORIENTATION from 10/29/2023 in [facility] CARDIAC REHABILITATION  Referring Provider [name] MD       Encounter Date: 12/28/2023  Check In:  Session Check In - 12/28/23 1430       Check-In   Supervising physician immediately available to respond to emergencies See telemetry face sheet for immediately available ER MD    Location [facility]    Staff Present Desiree Lucy, BSN, RN, WTA-C;Elzora Cullins, RN;Jessica Lou­za, MA, RCEP, CCRP, Dow Adolph, RN, BSN    Virtual Visit No    Medication changes reported     No    Fall or balance concerns reported    No    Warm-up and Cool-down Performed on first and last piece of equipment    Resistance Training Performed Yes    VAD Patient? No    PAD/SET Patient? No      Pain Assessment   Currently in Pain? No/denies    Multiple Pain Sites No             Capillary Blood Glucose: No results found. However, due to the size of the patient record, not all encounters were searched. Please check Results Review for a complete set of results.    Social History   Tobacco Use  Smoking Status Never  Smokeless Tobacco Never    Goals Met:  Independence with exercise equipment Exercise tolerated well No report of concerns or symptoms today Strength training completed today  Goals Unmet:  Not Applicable  Comments: Pt able to follow exercise prescription today without complaint.  Will continue to monitor for progression.

## 2023-12-30 ENCOUNTER — Encounter (HOSPITAL_COMMUNITY)
Admission: RE | Admit: 2023-12-30 | Discharge: 2023-12-30 | Disposition: A | Discharge status: Home / Self Care | Payer: Medicare Other | Source: Ambulatory Visit | Attending: Cardiology | Admitting: Cardiology

## 2023-12-30 DIAGNOSIS — I5022 Chronic systolic (congestive) heart failure: Secondary | ICD-10-CM

## 2023-12-30 NOTE — Progress Notes (Signed)
 Daily Session Note  Patient Details  Name: LAVREN LEWAN MRN: [id number] Date of Birth: 08/25/1946 Referring Provider:   Flowsheet Row CARDIAC REHAB PHASE II ORIENTATION from 10/29/2023 in [facility] CARDIAC REHABILITATION  Referring Provider [name] MD       Encounter Date: 12/30/2023  Check In:  Session Check In - 12/30/23 1405       Check-In   Supervising physician immediately available to respond to emergencies See telemetry face sheet for immediately available MD    Location [facility]    Staff Present Avanell Shackleton BSN, RN;Heather Fredric Mare, [place], Exercise Physiologist    Virtual Visit No    Medication changes reported     No    Fall or balance concerns reported    No    Tobacco Cessation No Change    Warm-up and Cool-down Performed on first and last piece of equipment    Resistance Training Performed Yes    VAD Patient? No    PAD/SET Patient? No      Pain Assessment   Currently in Pain? No/denies    Multiple Pain Sites No             Capillary Blood Glucose: No results found. However, due to the size of the patient record, not all encounters were searched. Please check Results Review for a complete set of results.    Social History   Tobacco Use  Smoking Status Never  Smokeless Tobacco Never    Goals Met:  Independence with exercise equipment Exercise tolerated well No report of concerns or symptoms today Strength training completed today  Goals Unmet:  Not Applicable  Comments: Marland KitchenMarland KitchenPt able to follow exercise prescription today without complaint.  Will continue to monitor for progression.

## 2024-01-04 ENCOUNTER — Encounter (HOSPITAL_COMMUNITY)
Admission: RE | Admit: 2024-01-04 | Discharge: 2024-01-04 | Disposition: A | Discharge status: Home / Self Care | Payer: Medicare Other | Source: Ambulatory Visit | Attending: Cardiology | Admitting: Cardiology

## 2024-01-04 DIAGNOSIS — I5022 Chronic systolic (congestive) heart failure: Secondary | ICD-10-CM

## 2024-01-04 NOTE — Progress Notes (Signed)
 Daily Session Note  Patient Details  Name: Tara [name] MRN: [id number] Date of Birth: Jul 31, 1946 Referring Provider:   Flowsheet Row CARDIAC REHAB PHASE II ORIENTATION from 10/29/2023 in [facility] CARDIAC REHABILITATION  Referring Provider Peder Bourdon MD       Encounter Date: 01/04/2024  Check In:  Session Check In - 01/04/24 1430       Check-In   Supervising physician immediately available to respond to emergencies See telemetry face sheet for immediately available MD    Location [facility]    Staff Present Clotilda Danish, BS, Exercise Physiologist;Maizee Reinhold, Adah Acron, RN, BSN    Virtual Visit No    Medication changes reported     No    Fall or balance concerns reported    No    Warm-up and Cool-down Performed on first and last piece of equipment    Resistance Training Performed Yes    VAD Patient? No    PAD/SET Patient? No      Pain Assessment   Currently in Pain? No/denies    Multiple Pain Sites No             Capillary Blood Glucose: No results found. However, due to the size of the patient record, not all encounters were searched. Please check Results Review for a complete set of results.    Social History   Tobacco Use  Smoking Status Never  Smokeless Tobacco Never    Goals Met:  Independence with exercise equipment Exercise tolerated well No report of concerns or symptoms today Strength training completed today  Goals Unmet:  Not Applicable  Comments: Pt able to follow exercise prescription today without complaint.  Will continue to monitor for progression.

## 2024-01-06 ENCOUNTER — Encounter (HOSPITAL_COMMUNITY)
Admission: RE | Admit: 2024-01-06 | Discharge: 2024-01-06 | Disposition: A | Discharge status: Home / Self Care | Payer: Medicare Other | Source: Ambulatory Visit | Attending: Cardiology | Admitting: Cardiology

## 2024-01-06 VITALS — Ht 58.25 in | Wt 141.5 lb

## 2024-01-06 DIAGNOSIS — I5022 Chronic systolic (congestive) heart failure: Secondary | ICD-10-CM | POA: Diagnosis not present

## 2024-01-06 NOTE — Progress Notes (Signed)
 Daily Session Note  Patient Details  Name: KENZINGTON MIELKE MRN: [id number] Date of Birth: December 14, 1945 Referring Provider:   Flowsheet Row CARDIAC REHAB PHASE II ORIENTATION from 10/29/2023 in [facility] CARDIAC REHABILITATION  Referring Provider Peder Bourdon MD       Encounter Date: 01/06/2024  Check In:  Session Check In - 01/06/24 1405       Check-In   Supervising physician immediately available to respond to emergencies See telemetry face sheet for immediately available MD    Location [facility]    Staff Present Ronna Coho BSN, RN;Brittany Annette Barters, BSN, RN, WTA-C;Heather Toy Freund, BS, Exercise Physiologist    Virtual Visit No    Medication changes reported     No    Fall or balance concerns reported    No    Tobacco Cessation No Change    Warm-up and Cool-down Performed on first and last piece of equipment    Resistance Training Performed Yes    VAD Patient? No    PAD/SET Patient? No      Pain Assessment   Currently in Pain? No/denies    Multiple Pain Sites No             Capillary Blood Glucose: No results found. However, due to the size of the patient record, not all encounters were searched. Please check Results Review for a complete set of results.    Social History   Tobacco Use  Smoking Status Never  Smokeless Tobacco Never    Goals Met:  Independence with exercise equipment Exercise tolerated well No report of concerns or symptoms today Strength training completed today  Goals Unmet:  Not Applicable  Comments: Aaron AasAaron AasPt able to follow exercise prescription today without complaint.  Will continue to monitor for progression.

## 2024-01-06 NOTE — Patient Instructions (Signed)
 Discharge Patient Instructions  Patient Details  Name: Tara [name] MRN: [id number] Date of Birth: 06-17-1946 Referring Provider:  Kerri Perches, MD   Number of Visits: 31   Reason for Discharge:  Patient reached a stable level of exercise. Patient independent in their exercise. Patient has met program and personal goals.  Smoking History:  Social History   Tobacco Use  Smoking Status Never  Smokeless Tobacco Never    Diagnosis:  Chronic systolic CHF (congestive heart failure) (HCC)  Initial Exercise Prescription:  Initial Exercise Prescription - 10/29/23 1400       Date of Initial Exercise RX and Referring Provider   Date 10/29/23    Referring Provider [name] MD      Oxygen   Maintain Oxygen Saturation 88% or higher      NuStep   Level 1    SPM 80    Minutes 15    METs 2      Track   Laps 10    Minutes 15    METs 2      Prescription Details   Frequency (times per week) 2    Duration Progress to 30 minutes of continuous aerobic without signs/symptoms of physical distress      Intensity   THRR 40-80% of Max Heartrate 93-126    Ratings of Perceived Exertion 11-13    Perceived Dyspnea 0-4      Progression   Progression Continue to progress workloads to maintain intensity without signs/symptoms of physical distress.      Resistance Training   Training Prescription Yes    Weight 2 lb    Reps 10-15             Discharge Exercise Prescription (Final Exercise Prescription Changes):  Exercise Prescription Changes - 12/21/23 1500       Response to Exercise   Blood Pressure (Admit) 118/64    Blood Pressure (Exit) 122/64    Heart Rate (Admit) 71 bpm    Heart Rate (Exercise) 86 bpm    Heart Rate (Exit) 70 bpm    Rating of Perceived Exertion (Exercise) 11    Duration Continue with 30 min of aerobic exercise without signs/symptoms of physical distress.    Intensity THRR unchanged      Progression   Progression Continue to progress  workloads to maintain intensity without signs/symptoms of physical distress.      Resistance Training   Training Prescription Yes    Weight 2    Reps 10-15      NuStep   Level 1    SPM 42    Minutes 15    METs 1.5      Track   Laps 18    Minutes 15    METs 2.03      Oxygen   Maintain Oxygen Saturation 88% or higher             Functional Capacity:  6 Minute Walk     Row Name 10/29/23 1450 01/06/24 1457       6 Minute Walk   Phase Initial Discharge    Distance 720 feet 900 feet    Walk Time 6 minutes 6 minutes    # of Rest Breaks 0 0    MPH 1.36 1.7    METS 1.55 1.43    RPE 9 12    Perceived Dyspnea  -- 1    VO2 Peak 5.43 5    Symptoms No No    Resting  HR 59 bpm 66 bpm    Resting BP 106/54 120/60    Resting Oxygen Saturation  95 % 98 %    Exercise Oxygen Saturation  during 6 min walk 88 % 96 %    Max Ex. HR 119 bpm 98 bpm    Max Ex. BP 148/74 118/60    2 Minute Post BP 106/62 112/60            Nutrition & Weight - Outcomes:  Pre Biometrics - 10/29/23 1458       Pre Biometrics   Height 4' 10.25" (1.48 m)    Weight 65.8 kg    Waist Circumference 32 inches    Hip Circumference 40 inches    Waist to Hip Ratio 0.8 %    BMI (Calculated) 30.05    Grip Strength 22 kg    Single Leg Stand 0 seconds             Post Biometrics - 01/06/24 1504        Post  Biometrics   Height 4' 10.25" (1.48 m)    Weight 64.2 kg    Waist Circumference 32 inches    Hip Circumference 38 inches    Waist to Hip Ratio 0.84 %    BMI (Calculated) 29.31            Goals reviewed with patient; copy given to patient.

## 2024-01-11 ENCOUNTER — Encounter (HOSPITAL_COMMUNITY)
Admission: RE | Admit: 2024-01-11 | Discharge: 2024-01-11 | Disposition: A | Discharge status: Home / Self Care | Payer: Medicare Other | Source: Ambulatory Visit | Attending: Cardiology | Admitting: Cardiology

## 2024-01-11 DIAGNOSIS — I5022 Chronic systolic (congestive) heart failure: Secondary | ICD-10-CM

## 2024-01-11 NOTE — Progress Notes (Signed)
 Daily Session Note  Patient Details  Name: Tara [name] MRN: [id number] Date of Birth: 1946/03/14 Referring Provider:   Flowsheet Row CARDIAC REHAB PHASE II ORIENTATION from 10/29/2023 in [facility]  Referring Provider Peder Bourdon MD       Encounter Date: 01/11/2024  Check In:  Session Check In - 01/11/24 1432       Check-In   Supervising physician immediately available to respond to emergencies See telemetry face sheet for immediately available MD    Location [facility]    Staff Present Jerrol Morelle, BSN, RN, WTA-C;Heather Toy Freund, BS, Exercise Physiologist;Phyllis Billingsley, RN    Virtual Visit No    Medication changes reported     No    Fall or balance concerns reported    No    Tobacco Cessation No Change    Warm-up and Cool-down Performed on first and last piece of equipment    Resistance Training Performed Yes    VAD Patient? No    PAD/SET Patient? No      Pain Assessment   Currently in Pain? No/denies    Multiple Pain Sites No             Capillary Blood Glucose: No results found. However, due to the size of the patient record, not all encounters were searched. Please check Results Review for a complete set of results.    Social History   Tobacco Use  Smoking Status Never  Smokeless Tobacco Never    Goals Met:  Independence with exercise equipment Exercise tolerated well No report of concerns or symptoms today Strength training completed today  Goals Unmet:  Not Applicable  Comments: Pt able to follow exercise prescription today without complaint.  Will continue to monitor for progression.

## 2024-01-13 ENCOUNTER — Encounter (HOSPITAL_COMMUNITY)
Admission: RE | Admit: 2024-01-13 | Discharge: 2024-01-13 | Disposition: A | Discharge status: Home / Self Care | Payer: Medicare Other | Source: Ambulatory Visit | Attending: Cardiology

## 2024-01-13 ENCOUNTER — Encounter (HOSPITAL_COMMUNITY): Payer: Self-pay | Admitting: *Deleted

## 2024-01-13 DIAGNOSIS — I5022 Chronic systolic (congestive) heart failure: Secondary | ICD-10-CM

## 2024-01-13 NOTE — Progress Notes (Signed)
 Daily Session Note  Patient Details  Name: JAMIRA BARFUSS MRN: [id number] Date of Birth: 1946/05/07 Referring Provider:   Flowsheet Row CARDIAC REHAB PHASE II ORIENTATION from 10/29/2023 in [facility]  Referring Provider Peder Bourdon MD       Encounter Date: 01/13/2024  Check In:  Session Check In - 01/13/24 1405       Check-In   Supervising physician immediately available to respond to emergencies See telemetry face sheet for immediately available MD    Location [facility]    Staff Present Ronna Coho BSN, RN;Heather Toy Freund, [place], Exercise Physiologist;Brittany Annette Barters, BSN, RN, WTA-C    Virtual Visit No    Medication changes reported     No    Fall or balance concerns reported    Yes    Comments Pt fell after her last session    Tobacco Cessation No Change    Warm-up and Cool-down Performed on first and last piece of equipment    Resistance Training Performed Yes    VAD Patient? No    PAD/SET Patient? No      Pain Assessment   Currently in Pain? No/denies    Multiple Pain Sites No             Capillary Blood Glucose: No results found. However, due to the size of the patient record, not all encounters were searched. Please check Results Review for a complete set of results.    Social History   Tobacco Use  Smoking Status Never  Smokeless Tobacco Never    Goals Met:  Independence with exercise equipment Exercise tolerated well No report of concerns or symptoms today Strength training completed today  Goals Unmet:  Not Applicable  Comments: .Aaron Aas Melva graduated today from  rehab with 36 sessions completed.  Details of the patient's exercise prescription and what she needs to do in order to continue the prescription and progress were discussed with patient.  Patient was given a copy of prescription and goals.  Patient verbalized understanding. Nikaya plans to continue to exercise at home.

## 2024-01-13 NOTE — Progress Notes (Signed)
 Cardiac Individual Treatment Plan  Patient Details  Name: Tara [name] MRN: [id number] Date of Birth: 1946/07/07 Referring Provider:   Flowsheet Row CARDIAC REHAB PHASE II ORIENTATION from 10/29/2023 in [facility] CARDIAC REHABILITATION  Referring Provider Peder Bourdon MD       Initial Encounter Date:  Flowsheet Row CARDIAC REHAB PHASE II ORIENTATION from 10/29/2023 in [place] CARDIAC REHABILITATION  Date 10/29/23       Visit Diagnosis: Chronic systolic CHF (congestive heart failure) (HCC)  Patient's Home Medications on Admission:  Current Outpatient Medications:    acetaminophen  (TYLENOL ) 500 MG tablet, Take 1,000 mg by mouth as needed., Disp: , Rfl:    amiodarone  (PACERONE ) 200 MG tablet, Take 1 tablet (200 mg total) by mouth daily., Disp: 30 tablet, Rfl: 6   apixaban  (ELIQUIS ) 5 MG TABS tablet, Take 1 tablet (5 mg total) by mouth 2 (two) times daily., Disp: 60 tablet, Rfl: 6   bisacodyl  5 MG EC tablet, One tablet every 3 days, if needed, for constipation, Disp: 30 tablet, Rfl: 1   clonazePAM  (KLONOPIN ) 0.5 MG tablet, Take 1 tablet (0.5 mg total) by mouth at bedtime., Disp: 30 tablet, Rfl: 5   docusate sodium  (COLACE) 100 MG capsule, Take 1 capsule (100 mg total) by mouth 2 (two) times daily., Disp: , Rfl:    FARXIGA  10 MG TABS tablet, Take 1 tablet (10 mg total) by mouth daily before breakfast., Disp: 90 tablet, Rfl: 3   fluticasone  (FLONASE ) 50 MCG/ACT nasal spray, Place 2 sprays into both nostrils daily., Disp: 48 g, Rfl: 3   furosemide  (LASIX ) 20 MG tablet, Take 1 tablet (20 mg total) by mouth daily., Disp: 30 tablet, Rfl: 11   hydrocortisone  (ANUSOL -HC) 2.5 % rectal cream, APPLY RECTALLY TWICE DAILY., Disp: 30 g, Rfl: 3   hydrocortisone  (ANUSOL -HC) 25 MG suppository, Place 1 suppository (25 mg total) rectally 2 (two) times daily., Disp: 12 suppository, Rfl: 3   hydrOXYzine  (ATARAX ) 10 MG tablet, Take one tablet at bedtime for itching, Disp: 30 tablet, Rfl: 3   lacosamide   (VIMPAT ) 50 MG TABS tablet, Take 1 tablet (50 mg total) by mouth 2 (two) times daily., Disp: 180 tablet, Rfl: 2   levETIRAcetam  (KEPPRA ) 500 MG tablet, Take 1 tablet (500 mg total) by mouth 2 (two) times daily., Disp: 180 tablet, Rfl: 3   montelukast  (SINGULAIR ) 10 MG tablet, Take 1 tablet (10 mg total) by mouth at bedtime., Disp: 90 tablet, Rfl: 3   pantoprazole  (PROTONIX ) 40 MG tablet, TAKE ONE TABLET BY MOUTH TWICE DAILY, Disp: 60 tablet, Rfl: 0   polyethylene glycol (MIRALAX ) 17 g packet, Take one packet mira lax once daily in 8 ounces water , Disp: 28 each, Rfl: 5   potassium chloride  SA (KLOR-CON  M) 20 MEQ tablet, Take 1 tablet (20 mEq total) by mouth daily., Disp: 30 tablet, Rfl: 11   rosuvastatin  (CRESTOR ) 10 MG tablet, Take 1 tablet (10 mg total) by mouth daily., Disp: 90 tablet, Rfl: 3   sertraline  (ZOLOFT ) 25 MG tablet, TAKE 1 TABLET BY MOUTH ONCE A DAY., Disp: 30 tablet, Rfl: 0   spironolactone  (ALDACTONE ) 25 MG tablet, Take 0.5 tablets (12.5 mg total) by mouth daily., Disp: 45 tablet, Rfl: 3  Past Medical History: 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   Angio-edema    Anxiety    Breast cancer (HCC) 2008   right - s/p lumpectomy->chemo, radiation   Depression  Dysrhythmia    hx SVT   Family history of colon cancer    Family history of prostate cancer    GERD (gastroesophageal reflux disease)    H/O: hysterectomy    Hiatal hernia    History of cancer chemotherapy    History of radiation therapy    Hyperlipidemia    Hypertension 01/25/2018   Nonischemic cardiomyopathy (HCC)    Personal history of radiation therapy 01/05/209   SVT (supraventricular tachycardia) (HCC)    short RP SVT documented 5/14   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HCC)    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)   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    Tobacco  Use: Social History   Tobacco Use  Smoking Status Never  Smokeless Tobacco Never    Labs: Review Flowsheet  More data exists      Latest Ref Rng & Units 04/18/2022 12/31/2022 06/03/2023 06/24/2023 12/15/2023  Labs for ITP Cardiac and Pulmonary Rehab  Cholestrol 0 - 200 mg/dL 73  098  - - 119   LDL (calc) 0 - 99 mg/dL 38  94  - - 147   HDL-C >40 mg/dL 18  69  - - 75   Trlycerides <150 mg/dL 85  81  - - 55   Hemoglobin A1c 4.8 - 5.6 % 5.9  6.1  6.2  - -  TCO2 22 - 32 mmol/L - - - 18  -    Capillary Blood Glucose: Lab Results  Component Value Date   GLUCAP 86 06/22/2023   GLUCAP 91 06/21/2023   GLUCAP 78 09/22/2022   GLUCAP 91 09/22/2022   GLUCAP 114 (H) 09/22/2022     Exercise Target Goals: Exercise Program Goal: Individual exercise prescription set using results from initial 6 min walk test and THRR while considering  patient's activity barriers and safety.   Exercise Prescription Goal: Starting with aerobic activity 30 plus minutes a day, 3 days per week for initial exercise prescription. Provide home exercise prescription and guidelines that participant acknowledges understanding prior to discharge.  Activity Barriers & Risk Stratification:  Activity Barriers & Cardiac Risk Stratification - 10/29/23 1315       Activity Barriers & Cardiac Risk Stratification   Activity Barriers Left Knee Replacement;Deconditioning;Shortness of Breath;Balance Concerns;Muscular Weakness    Cardiac Risk Stratification Moderate             6 Minute Walk:  6 Minute Walk     Row Name 10/29/23 1450 01/06/24 1457       6 Minute Walk   Phase Initial Discharge    Distance 720 feet 900 feet    Walk Time 6 minutes 6 minutes    # of Rest Breaks 0 0    MPH 1.36 1.7    METS 1.55 1.43    RPE 9 12    Perceived Dyspnea  -- 1    VO2 Peak 5.43 5    Symptoms No No    Resting HR 59 bpm 66 bpm    Resting BP 106/54 120/60    Resting Oxygen  Saturation  95 % 98 %    Exercise Oxygen   Saturation  during 6 min walk 88 % 96 %    Max Ex. HR 119 bpm 98 bpm    Max Ex. BP 148/74 118/60    2 Minute Post BP 106/62 112/60  Oxygen  Initial Assessment:   Oxygen  Re-Evaluation:   Oxygen  Discharge (Final Oxygen  Re-Evaluation):   Initial Exercise Prescription:  Initial Exercise Prescription - 10/29/23 1400       Date of Initial Exercise RX and Referring Provider   Date 10/29/23    Referring Provider Peder Bourdon MD      Oxygen    Maintain Oxygen  Saturation 88% or higher      NuStep   Level 1    SPM 80    Minutes 15    METs 2      Track   Laps 10    Minutes 15    METs 2      Prescription Details   Frequency (times per week) 2    Duration Progress to 30 minutes of continuous aerobic without signs/symptoms of physical distress      Intensity   THRR 40-80% of Max Heartrate 93-126    Ratings of Perceived Exertion 11-13    Perceived Dyspnea 0-4      Progression   Progression Continue to progress workloads to maintain intensity without signs/symptoms of physical distress.      Resistance Training   Training Prescription Yes    Weight 2 lb    Reps 10-15             Perform Capillary Blood Glucose checks as needed.  Exercise Prescription Changes:   Exercise Prescription Changes     Row Name 10/29/23 1400 11/13/23 1500 11/23/23 1200 12/07/23 1500 12/21/23 1500     Response to Exercise   Blood Pressure (Admit) 106/54 106/60 102/60 100/58 118/64   Blood Pressure (Exercise) 148/74 138/58 112/60 138/60 --   Blood Pressure (Exit) 106/62 98/50 102/62 106/60 122/64   Heart Rate (Admit) 59 bpm 70 bpm 63 bpm 61 bpm 71 bpm   Heart Rate (Exercise) 119 bpm 96 bpm 100 bpm 87 bpm 86 bpm   Heart Rate (Exit) 64 bpm 95 bpm 64 bpm 62 bpm 70 bpm   Oxygen  Saturation (Admit) 95 % -- -- -- --   Oxygen  Saturation (Exercise) 88 % -- -- -- --   Rating of Perceived Exertion (Exercise) 9 11 12 11 11    Symptoms none -- -- -- --   Comments walk test  results -- -- -- --   Duration -- Continue with 30 min of aerobic exercise without signs/symptoms of physical distress. Continue with 30 min of aerobic exercise without signs/symptoms of physical distress. Continue with 30 min of aerobic exercise without signs/symptoms of physical distress. Continue with 30 min of aerobic exercise without signs/symptoms of physical distress.   Intensity -- THRR unchanged THRR unchanged THRR unchanged THRR unchanged     Progression   Progression -- Continue to progress workloads to maintain intensity without signs/symptoms of physical distress. Continue to progress workloads to maintain intensity without signs/symptoms of physical distress. Continue to progress workloads to maintain intensity without signs/symptoms of physical distress. Continue to progress workloads to maintain intensity without signs/symptoms of physical distress.     Resistance Training   Training Prescription -- Yes Yes Yes Yes   Weight -- 2 2 2 2    Reps -- 10-15 10-15 10-15 10-15     NuStep   Level -- 2 2 3 1    SPM -- 41 34 41 42   Minutes -- 15 15 15 15    METs -- 1.5 1.3 1.4 1.5     Track   Laps -- 24 27 18 18    Minutes -- 15 15  15 15   METs -- 2.3 2.17 2.03 2.03     Oxygen    Maintain Oxygen  Saturation -- -- 88% or higher 88% or higher 88% or higher            Exercise Comments:   Exercise Comments     Row Name 10/29/23 1507 11/02/23 1429         Exercise Comments Tara [name] restarted rehab today.  She was able to complete versus Nustep Test. First full day of exercise!  Patient was oriented to gym and equipment including functions, settings, policies, and procedures.  Patient's individual exercise prescription and treatment plan were reviewed.  All starting workloads were established based on the results of the 6 minute walk test done at initial orientation visit.  The plan for exercise progression was also introduced and progression will be customized based on  patient's performance and goals.               Exercise Goals and Review:   Exercise Goals     Row Name 10/29/23 1457             Exercise Goals   Increase Physical Activity Yes       Intervention Provide advice, education, support and counseling about physical activity/exercise needs.;Develop an individualized exercise prescription for aerobic and resistive training based on initial evaluation findings, risk stratification, comorbidities and participant's personal goals.       Expected Outcomes Short Term: Attend rehab on a regular basis to increase amount of physical activity.;Long Term: Add in home exercise to make exercise part of routine and to increase amount of physical activity.;Long Term: Exercising regularly at least 3-5 days a week.       Increase Strength and Stamina Yes       Intervention Provide advice, education, support and counseling about physical activity/exercise needs.;Develop an individualized exercise prescription for aerobic and resistive training based on initial evaluation findings, risk stratification, comorbidities and participant's personal goals.       Expected Outcomes Short Term: Increase workloads from initial exercise prescription for resistance, speed, and METs.;Short Term: Perform resistance training exercises routinely during rehab and add in resistance training at home;Long Term: Improve cardiorespiratory fitness, muscular endurance and strength as measured by increased METs and functional capacity ( )       Able to understand and use rate of perceived exertion (RPE) scale Yes       Intervention Provide education and explanation on how to use RPE scale       Expected Outcomes Short Term: Able to use RPE daily in rehab to express subjective intensity level;Long Term:  Able to use RPE to guide intensity level when exercising independently       Able to understand and use Dyspnea scale Yes       Intervention Provide education and explanation on how  to use Dyspnea scale       Expected Outcomes Short Term: Able to use Dyspnea scale daily in rehab to express subjective sense of shortness of breath during exertion;Long Term: Able to use Dyspnea scale to guide intensity level when exercising independently       Knowledge and understanding of Target Heart Rate Range (THRR) Yes       Intervention Provide education and explanation of THRR including how the numbers were predicted and where they are located for reference       Expected Outcomes Short Term: Able to state/look up THRR;Long Term: Able to use THRR to govern intensity  when exercising independently;Short Term: Able to use daily as guideline for intensity in rehab       Able to check pulse independently Yes       Intervention Provide education and demonstration on how to check pulse in carotid and radial arteries.;Review the importance of being able to check your own pulse for safety during independent exercise       Expected Outcomes Short Term: Able to explain why pulse checking is important during independent exercise;Long Term: Able to check pulse independently and accurately       Understanding of Exercise Prescription Yes       Intervention Provide education, explanation, and written materials on patient's individual exercise prescription       Expected Outcomes Short Term: Able to explain program exercise prescription;Long Term: Able to explain home exercise prescription to exercise independently                Exercise Goals Re-Evaluation :  Exercise Goals Re-Evaluation     Row Name 11/02/23 1429 11/16/23 0932 12/07/23 1502 12/23/23 0833       Exercise Goal Re-Evaluation   Exercise Goals Review -- Increase Physical Activity;Increase Strength and Stamina;Understanding of Exercise Prescription Increase Physical Activity;Increase Strength and Stamina;Understanding of Exercise Prescription Increase Physical Activity;Increase Strength and Stamina;Understanding of Exercise  Prescription    Comments Reviewed RPE and dyspnea scale, THR and program prescription with pt today.  Pt voiced understanding and was given a copy of goals to take home. Tara [name] is doing well i rehab and is tolerating exericse well. She is getting stronger and walking more laps each visit. Will continue to monitor and progress asable. Tara [name] is doing well in rehab.  She is feeling good weith exercise in rehab.  She is walking around the house more and getting up and down more.  She is now going to get things for herself.  We will review home exercise guidelines with her soon. Tara [name] is doing well in rehab and is on her 22nd visit. She is walking around the gym more and getting in more labs each visit. She is no longer needing her walker or gait belt to feel comfortable walking. Will continue to monitor and progress as able.    Expected Outcomes Short: Use RPE daily to regulate intensity.  Long: Follow program prescription in THR. Continue to attend rehab Short Review home exercise guidelines Long; Continue to improve stamina continue to attend rehab              Discharge Exercise Prescription (Final Exercise Prescription Changes):  Exercise Prescription Changes - 12/21/23 1500       Response to Exercise   Blood Pressure (Admit) 118/64    Blood Pressure (Exit) 122/64    Heart Rate (Admit) 71 bpm    Heart Rate (Exercise) 86 bpm    Heart Rate (Exit) 70 bpm    Rating of Perceived Exertion (Exercise) 11    Duration Continue with 30 min of aerobic exercise without signs/symptoms of physical distress.    Intensity THRR unchanged      Progression   Progression Continue to progress workloads to maintain intensity without signs/symptoms of physical distress.      Resistance Training   Training Prescription Yes    Weight 2    Reps 10-15      NuStep   Level 1    SPM 42    Minutes 15    METs 1.5      Track   Laps 18  Minutes 15    METs 2.03      Oxygen    Maintain Oxygen   Saturation 88% or higher             Nutrition:  Target Goals: Understanding of nutrition guidelines, daily intake of sodium 1500mg , cholesterol 200mg , calories 30% from fat and 7% or less from saturated fats, daily to have 5 or more servings of fruits and vegetables.  Biometrics:  Pre Biometrics - 10/29/23 1458       Pre Biometrics   Height 4' 10.25" (1.48 m)    Weight 145 lb 1.6 oz (65.8 kg)    Waist Circumference 32 inches    Hip Circumference 40 inches    Waist to Hip Ratio 0.8 %    BMI (Calculated) 30.05    Grip Strength 22 kg    Single Leg Stand 0 seconds             Post Biometrics - 01/06/24 1504        Post  Biometrics   Height 4' 10.25" (1.48 m)    Weight 141 lb 8.6 oz (64.2 kg)    Waist Circumference 32 inches    Hip Circumference 38 inches    Waist to Hip Ratio 0.84 %    BMI (Calculated) 29.31             Nutrition Therapy Plan and Nutrition Goals:  Nutrition Therapy & Goals - 10/29/23 1319       Intervention Plan   Intervention Prescribe, educate and counsel regarding individualized specific dietary modifications aiming towards targeted core components such as weight, hypertension, lipid management, diabetes, heart failure and other comorbidities.;Nutrition handout(s) given to patient.    Expected Outcomes Short Term Goal: Understand basic principles of dietary content, such as calories, fat, sodium, cholesterol and nutrients.;Long Term Goal: Adherence to prescribed nutrition plan.             Nutrition Assessments:  MEDIFICTS Score Key: >=70 Need to make dietary changes  40-70 Heart Healthy Diet <= 40 Therapeutic Level Cholesterol Diet  Flowsheet Row CARDIAC REHAB PHASE II ORIENTATION from 10/29/2023 in [facility] CARDIAC REHABILITATION  Picture Your Plate Total Score on Admission 73      Picture Your Plate Scores: <16 Unhealthy dietary pattern with much room for improvement. 41-50 Dietary pattern unlikely to meet  recommendations for good health and room for improvement. 51-60 More healthful dietary pattern, with some room for improvement.  >60 Healthy dietary pattern, although there may be some specific behaviors that could be improved.    Nutrition Goals Re-Evaluation:  Nutrition Goals Re-Evaluation     Row Name 12/07/23 1507             Goals   Nutrition Goal Heart Healthy diet       Comment She is has been working on her diet.  She is doing her best to stick to heart healthy meals.  Since they attended nutrition education class, her husband has been making sure that she is sticking to her diet.  She is getting a lot of fruits and vegetables.       Expected Outcome Short: Stick to heart healthy diet Long: Continue to add variety                Nutrition Goals Discharge (Final Nutrition Goals Re-Evaluation):  Nutrition Goals Re-Evaluation - 12/07/23 1507       Goals   Nutrition Goal Heart Healthy diet    Comment She is has been  working on her diet.  She is doing her best to stick to heart healthy meals.  Since they attended nutrition education class, her husband has been making sure that she is sticking to her diet.  She is getting a lot of fruits and vegetables.    Expected Outcome Short: Stick to heart healthy diet Long: Continue to add variety             Psychosocial: Target Goals: Acknowledge presence or absence of significant depression and/or stress, maximize coping skills, provide positive support system. Participant is able to verbalize types and ability to use techniques and skills needed for reducing stress and depression.  Initial Review & Psychosocial Screening:  Initial Psych Review & Screening - 10/29/23 1318       Initial Review   Current issues with None Identified      Family Dynamics   Good Support System? Yes   husband, daughter, care takers     Barriers   Psychosocial barriers to participate in program The patient should benefit from training in  stress management and relaxation.;There are no identifiable barriers or psychosocial needs.      Screening Interventions   Interventions Encouraged to exercise;To provide support and resources with identified psychosocial needs;Provide feedback about the scores to participant    Expected Outcomes Short Term goal: Utilizing psychosocial counselor, staff and physician to assist with identification of specific Stressors or current issues interfering with healing process. Setting desired goal for each stressor or current issue identified.;Long Term Goal: Stressors or current issues are controlled or eliminated.;Short Term goal: Identification and review with participant of any Quality of Life or Depression concerns found by scoring the questionnaire.;Long Term goal: The participant improves quality of Life and PHQ9 Scores as seen by post scores and/or verbalization of changes             Quality of Life Scores:  Quality of Life - 10/29/23 1502       Quality of Life   Select Quality of Life      Quality of Life Scores   Health/Function Pre 25.2 %    Socioeconomic Pre 30 %    Psych/Spiritual Pre 23.14 %    Family Pre 30 %    GLOBAL Pre 26.57 %            Scores of 19 and below usually indicate a poorer quality of life in these areas.  A difference of  2-3 points is a clinically meaningful difference.  A difference of 2-3 points in the total score of the Quality of Life Index has been associated with significant improvement in overall quality of life, self-image, physical symptoms, and general health in studies assessing change in quality of life.  PHQ-9: Review Flowsheet  More data exists      10/29/2023 08/17/2023 08/11/2023 08/05/2023 07/22/2023  Depression screen PHQ 2/9  Decreased Interest 2 1 0 0 0 0  Down, Depressed, Hopeless 0 0 0 0 0 0  PHQ - 2 Score 2 1 0 0 0 0  Altered sleeping 1 0 - - -  Tired, decreased energy 1 1 - - -  Change in appetite 1 1 - - -  Feeling bad or  failure about yourself  1 0 - - -  Trouble concentrating 0 0 - - -  Moving slowly or fidgety/restless 1 0 - - -  Suicidal thoughts 0 0 - - -  PHQ-9 Score 7 3 - - -  Difficult doing work/chores Very  difficult Somewhat difficult - - -    Details       Multiple values from one day are sorted in reverse-chronological order        Interpretation of Total Score  Total Score Depression Severity:  1-4 = Minimal depression, 5-9 = Mild depression, 10-14 = Moderate depression, 15-19 = Moderately severe depression, 20-27 = Severe depression   Psychosocial Evaluation and Intervention:  Psychosocial Evaluation - 10/29/23 1458       Psychosocial Evaluation & Interventions   Interventions Stress management education;Relaxation education;Encouraged to exercise with the program and follow exercise prescription    Comments Tara [name] is returning to cardiac rehab to finish program.  She called to stop previously due to not feeling well and husband was recovering from surgery.  She has paid caregivers at home that help with appts and documentations.  She denied any depression symptoms or major stressors.  Her caretaker will be bringing her to classes each day and they do not percieve any barriers to attending.  She is walking with gait belt with family and caregiver but no longer in wheelchair and using walker some at home.  She did her walk test today using a rollator walker for support.  Her knee and foot have been less painful recently and she wants to walk more.  Her goal is to be able to walk unassisted.  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  She sleeps well and her family keep her moving more at home.    Expected Outcomes Short: Attend program to build up stamina Long: Conitnue to work on walking and independence    Continue Psychosocial Services  Follow up required by staff             Psychosocial Re-Evaluation:  Psychosocial Re-Evaluation     Row Name 12/07/23 1505              Psychosocial Re-Evaluation   Current issues with None Identified       Comments Tara [name] is doing well in rehab. She is starting to feel better and moving around better now. She is staying more active at home and starting to get things for herself more.  She wants to conitnue to improve.  She is sleeping well for most part, but does have some rough nights.  She will get up and move around the house to find things she had forgotten about. She denies any major stressors.       Expected Outcomes Short: Conitnue to attend rehab to build strength Long; Continue to exercise for mental boost       Interventions Encouraged to attend Cardiac Rehabilitation for the exercise       Continue Psychosocial Services  Follow up required by staff                Psychosocial Discharge (Final Psychosocial Re-Evaluation):  Psychosocial Re-Evaluation - 12/07/23 1505       Psychosocial Re-Evaluation   Current issues with None Identified    Comments Tara [name] is doing well in rehab. She is starting to feel better and moving around better now. She is staying more active at home and starting to get things for herself more.  She wants to conitnue to improve.  She is sleeping well for most part, but does have some rough nights.  She will get up and move around the house to find things she had forgotten about. She denies any major stressors.    Expected Outcomes Short:  Conitnue to attend rehab to build strength Long; Continue to exercise for mental boost    Interventions Encouraged to attend Cardiac Rehabilitation for the exercise    Continue Psychosocial Services  Follow up required by staff             Vocational Rehabilitation: Provide vocational rehab assistance to qualifying candidates.   Vocational Rehab Evaluation & Intervention:  Vocational Rehab - 10/29/23 1319       Initial Vocational Rehab Evaluation & Intervention   Assessment shows need for Vocational Rehabilitation No   retired             Education: Education Goals: Education classes will be provided on a weekly basis, covering required topics. Participant will state understanding/return demonstration of topics presented.  Learning Barriers/Preferences:  Learning Barriers/Preferences - 10/29/23 1314       Learning Barriers/Preferences   Learning Barriers Sight   glasses for reading   Learning Preferences None             Education Topics: Hypertension, Hypertension Reduction -Define heart disease and high blood pressure. Discus how high blood pressure affects the body and ways to reduce high blood pressure.   Exercise and Your Heart -Discuss why it is important to exercise, the FITT principles of exercise, normal and abnormal responses to exercise, and how to exercise safely. Flowsheet Row CARDIAC REHAB PHASE II EXERCISE from 01/06/2024 in [place] C[facility]IAC [facility]AB[facility]IT[facility]  Date 11/04/23  Educator HB  Instruction Review Code 1- Verbalizes Understanding       Angina -Discuss definition of angina, causes of angina, treatment of angina, and how to decrease risk of having angina. Flowsheet Row CARDIAC REHAB PHASE II EXERCISE from 01/06/2024 in [place] C[facility]AC [facility]AB[facility]  Date 12/30/23  [tests/procedures]  Educator HB  Instruction Review Code 1- Verbalizes Understanding       Cardiac Medications -Review what the following cardiac medications are used for, how they affect the body, and side effects that may occur when taking the medications.  Medications include Aspirin , Beta blockers, calcium  channel blockers, ACE Inhibitors, angiotensin receptor blockers, diuretics, digoxin, and antihyperlipidemics. Flowsheet Row CARDIAC REHAB PHASE II EXERCISE from 01/06/2024 in [place] CARDIAC REHAB[facility]ITATION  Date 12/16/23  Educator dj  Instruction Review Code 1- Verbalizes Understanding       Congestive Heart Failure -Discuss the definition of CHF, how to live with CHF, the signs and symptoms of  CHF, and how keep track of weight and sodium intake. Flowsheet Row CARDIAC REHAB PHASE II EXERCISE from 01/06/2024 in [place] CARDIAC REHAB[facility]ITATION  Date 12/09/23  Educator HB  Instruction Review Code 1- Verbalizes Understanding       Heart Disease and Intimacy -Discus the effect sexual activity has on the heart, how changes occur during intimacy as we age, and safety during sexual activity. Flowsheet Row CARDIAC REHAB PHASE II EXERCISE from 08/26/2023 in [place] CARDIAC REHABILITATION  Date 08/26/23  Educator jh  Instruction Review Code 1- Verbalizes Understanding       Smoking Cessation / COPD -Discuss different methods to quit smoking, the health benefits of quitting smoking, and the definition of COPD.   Nutrition I: Fats -Discuss the types of cholesterol, what cholesterol does to the heart, and how cholesterol levels can be controlled. Flowsheet Row CARDIAC REHAB PHASE II EXERCISE from 01/06/2024 in [place] CARDIAC REHABILITATION  Date 11/25/23  Educator [facility]  Instruction Review Code 1- Verbalizes Understanding       Nutrition II: Labels -Discuss the different components  of food labels and how to read food label   Heart Parts/Heart Disease and PAD -Discuss the anatomy of the heart, the pathway of blood circulation through the heart, and these are affected by heart disease.   Stress I: Signs and Symptoms -Discuss the causes of stress, how stress may lead to anxiety and depression, and ways to limit stress. Flowsheet Row CARDIAC REHAB PHASE II EXERCISE from 01/06/2024 in [place] CARDIAC REHABILITATION  Date 12/02/23  Educator HB  Instruction Review Code 1- Verbalizes Understanding       Stress II: Relaxation -Discuss different types of relaxation techniques to limit stress. Flowsheet Row CARDIAC REHAB PHASE II EXERCISE from 01/06/2024 in [place] CARDIAC REHABILITATION  Date 11/18/23  Educator jh  Instruction Review Code 1- Verbalizes Understanding        Warning Signs of Stroke / TIA -Discuss definition of a stroke, what the signs and symptoms are of a stroke, and how to identify when someone is having stroke.   Knowledge Questionnaire Score:  Knowledge Questionnaire Score - 10/29/23 1503       Knowledge Questionnaire Score   Pre Score 18/24             Core Components/Risk Factors/Patient Goals at Admission:  Personal Goals and Risk Factors at Admission - 10/29/23 1503       Core Components/Risk Factors/Patient Goals on Admission    Weight Management Yes;Weight Loss;Obesity    Intervention Weight Management: Develop a combined nutrition and exercise program designed to reach desired caloric intake, while maintaining appropriate intake of nutrient and fiber, sodium and fats, and appropriate energy expenditure required for the weight goal.;Weight Management: Provide education and appropriate resources to help participant work on and attain dietary goals.;Weight Management/Obesity: Establish reasonable short term and long term weight goals.;Obesity: Provide education and appropriate resources to help participant work on and attain dietary goals.    Admit Weight 145 lb 1.6 oz (65.8 kg)    Goal Weight: Short Term 140 lb (63.5 kg)    Goal Weight: Long Term 135 lb (61.2 kg)    Expected Outcomes Long Term: Adherence to nutrition and physical activity/exercise program aimed toward attainment of established weight goal;Short Term: Continue to assess and modify interventions until short term weight is achieved;Weight Loss: Understanding of general recommendations for a balanced deficit meal plan, which promotes 1-2 lb weight loss per week and includes a negative energy balance of [phone] kcal/d;Understanding recommendations for meals to include 15-35% energy as protein, 25-35% energy from fat, 35-60% energy from carbohydrates, less than 200mg  of dietary cholesterol, 20-35 gm of total fiber daily;Understanding of distribution of calorie  intake throughout the day with the consumption of 4-5 meals/snacks    Heart Failure Yes    Intervention Provide a combined exercise and nutrition program that is supplemented with education, support and counseling about heart failure. Directed toward relieving symptoms such as shortness of breath, decreased exercise tolerance, and extremity edema.    Expected Outcomes Improve functional capacity of life;Short term: Attendance in program 2-3 days a week with increased exercise capacity. Reported lower sodium intake. Reported increased fruit and vegetable intake. Reports medication compliance.;Short term: Daily weights obtained and reported for increase. Utilizing diuretic protocols set by physician.;Long term: Adoption of self-care skills and reduction of barriers for early signs and symptoms recognition and intervention leading to self-care maintenance.    Hypertension Yes    Intervention Provide education on lifestyle modifcations including regular physical activity/exercise, weight management, moderate sodium restriction and increased consumption  of fresh fruit, vegetables, and low fat dairy, alcohol moderation, and smoking cessation.;Monitor prescription use compliance.    Expected Outcomes Short Term: Continued assessment and intervention until BP is < 140/28mm HG in hypertensive participants. < 130/23mm HG in hypertensive participants with diabetes, heart failure or chronic kidney disease.;Long Term: Maintenance of blood pressure at goal levels.    Lipids Yes    Intervention Provide education and support for participant on nutrition & aerobic/resistive exercise along with prescribed medications to achieve LDL 70mg , HDL >40mg .    Expected Outcomes Long Term: Cholesterol controlled with medications as prescribed, with individualized exercise RX and with personalized nutrition plan. Value goals: LDL < 70mg , HDL > 40 mg.;Short Term: Participant states understanding of desired cholesterol values and is  compliant with medications prescribed. Participant is following exercise prescription and nutrition guidelines.             Core Components/Risk Factors/Patient Goals Review:   Goals and Risk Factor Review     Row Name 12/07/23 1509             Core Components/Risk Factors/Patient Goals Review   Personal Goals Review Weight Management/Obesity;Heart Failure;Hypertension       Review Tara [name] is doing well in rehab. Her weight is staying steady and she is watching what she is eating. She denies any heart failure symptoms.  Her pressures are doing well.  They have always been on the lower side.  She does check them routinely at home.       Expected Outcomes short: Conitnue to keep eye on blood pressure Long: Continue to work on weight loss.                Core Components/Risk Factors/Patient Goals at Discharge (Final Review):   Goals and Risk Factor Review - 12/07/23 1509       Core Components/Risk Factors/Patient Goals Review   Personal Goals Review Weight Management/Obesity;Heart Failure;Hypertension    Review Tara [name] is doing well in rehab. Her weight is staying steady and she is watching what she is eating. She denies any heart failure symptoms.  Her pressures are doing well.  They have always been on the lower side.  She does check them routinely at home.    Expected Outcomes short: Conitnue to keep eye on blood pressure Long: Continue to work on weight loss.             ITP Comments:  ITP Comments     Row Name 10/29/23 1445 11/02/23 1428 11/18/23 0854 12/16/23 1014 01/13/24 1333   ITP Comments Patient attend orientation today.  Patient is attending Cardiac Rehabilitation Program.  Documentation for diagnosis can be found in [facility] 09/04/23.  Reviewed medical chart, RPE/RPD, gym safety, and program guidelines.  Patient was fitted to equipment they will be using during rehab.  Patient is scheduled to start exercise on Monday 11/02/23 at 1430.   Initial ITP created and  sent for review and signature by Dr. Armida Lander, Medical Director for Cardiac Rehabilitation Program.  Pt is returning to rehab from previous sessions that she had cancelled in December.  Today, is session 4 and she will finish her 36 sessions. First full day of exercise!  Patient was oriented to gym and equipment including functions, settings, policies, and procedures.  Patient's individual exercise prescription and treatment plan were reviewed.  All starting workloads were established based on the results of the 6 minute walk test done at initial orientation visit.  The plan for exercise progression was  also introduced and progression will be customized based on patient's performance and goals. 30 day review completed. ITP sent to Dr. Armida Lander, Medical Director of Cardiac Rehab. Continue with ITP unless changes are made by physician.    Still new to program. 30 day review completed. ITP sent to Dr. Armida Lander, Medical Director of Cardiac Rehab. Continue with ITP unless changes are made by physician. 30 day review completed. ITP sent to Dr. Armida Lander, Medical Director of Cardiac Rehab. Continue with ITP unless changes are made by physician.            Comments: 30 day review

## 2024-01-14 ENCOUNTER — Emergency Department (HOSPITAL_COMMUNITY)

## 2024-01-14 ENCOUNTER — Other Ambulatory Visit: Payer: Self-pay

## 2024-01-14 ENCOUNTER — Inpatient Hospital Stay (HOSPITAL_COMMUNITY): Admission: EM | Admit: 2024-01-14 | Discharge: 2024-01-19 | DRG: 083 | Disposition: A | Discharge status: Home Health

## 2024-01-14 ENCOUNTER — Encounter (HOSPITAL_COMMUNITY): Payer: Self-pay

## 2024-01-14 DIAGNOSIS — S025XXA Fracture of tooth (traumatic), initial encounter for closed fracture: Secondary | ICD-10-CM | POA: Diagnosis present

## 2024-01-14 DIAGNOSIS — Z823 Family history of stroke: Secondary | ICD-10-CM

## 2024-01-14 DIAGNOSIS — Z515 Encounter for palliative care: Secondary | ICD-10-CM

## 2024-01-14 DIAGNOSIS — Y92091 Bathroom in other non-institutional residence as the place of occurrence of the external cause: Secondary | ICD-10-CM

## 2024-01-14 DIAGNOSIS — Z820 Family history of epilepsy and other diseases of the nervous system: Secondary | ICD-10-CM

## 2024-01-14 DIAGNOSIS — I4819 Other persistent atrial fibrillation: Secondary | ICD-10-CM | POA: Diagnosis present

## 2024-01-14 DIAGNOSIS — Z23 Encounter for immunization: Secondary | ICD-10-CM | POA: Diagnosis not present

## 2024-01-14 DIAGNOSIS — E785 Hyperlipidemia, unspecified: Secondary | ICD-10-CM | POA: Diagnosis present

## 2024-01-14 DIAGNOSIS — R011 Cardiac murmur, unspecified: Secondary | ICD-10-CM | POA: Diagnosis not present

## 2024-01-14 DIAGNOSIS — M25561 Pain in right knee: Secondary | ICD-10-CM | POA: Diagnosis present

## 2024-01-14 DIAGNOSIS — I130 Hypertensive heart and chronic kidney disease with heart failure and stage 1 through stage 4 chronic kidney disease, or unspecified chronic kidney disease: Secondary | ICD-10-CM | POA: Diagnosis not present

## 2024-01-14 DIAGNOSIS — S8001XA Contusion of right knee, initial encounter: Secondary | ICD-10-CM | POA: Diagnosis not present

## 2024-01-14 DIAGNOSIS — S02672A Fracture of alveolus of left mandible, initial encounter for closed fracture: Principal | ICD-10-CM

## 2024-01-14 DIAGNOSIS — Z9181 History of falling: Secondary | ICD-10-CM

## 2024-01-14 DIAGNOSIS — I5022 Chronic systolic (congestive) heart failure: Secondary | ICD-10-CM

## 2024-01-14 DIAGNOSIS — Z800 Family history of malignant neoplasm of digestive organs: Secondary | ICD-10-CM

## 2024-01-14 DIAGNOSIS — I471 Supraventricular tachycardia, unspecified: Secondary | ICD-10-CM | POA: Diagnosis not present

## 2024-01-14 DIAGNOSIS — M25562 Pain in left knee: Secondary | ICD-10-CM | POA: Diagnosis present

## 2024-01-14 DIAGNOSIS — W19XXXD Unspecified fall, subsequent encounter: Secondary | ICD-10-CM

## 2024-01-14 DIAGNOSIS — G40909 Epilepsy, unspecified, not intractable, without status epilepticus: Secondary | ICD-10-CM | POA: Diagnosis present

## 2024-01-14 DIAGNOSIS — Z886 Allergy status to analgesic agent status: Secondary | ICD-10-CM

## 2024-01-14 DIAGNOSIS — I440 Atrioventricular block, first degree: Secondary | ICD-10-CM | POA: Diagnosis not present

## 2024-01-14 DIAGNOSIS — R7303 Prediabetes: Secondary | ICD-10-CM | POA: Diagnosis present

## 2024-01-14 DIAGNOSIS — Y9301 Activity, walking, marching and hiking: Secondary | ICD-10-CM | POA: Diagnosis present

## 2024-01-14 DIAGNOSIS — Z7189 Other specified counseling: Secondary | ICD-10-CM | POA: Diagnosis not present

## 2024-01-14 DIAGNOSIS — W010XXA Fall on same level from slipping, tripping and stumbling without subsequent striking against object, initial encounter: Secondary | ICD-10-CM | POA: Diagnosis present

## 2024-01-14 DIAGNOSIS — Z8611 Personal history of tuberculosis: Secondary | ICD-10-CM

## 2024-01-14 DIAGNOSIS — I495 Sick sinus syndrome: Secondary | ICD-10-CM | POA: Diagnosis not present

## 2024-01-14 DIAGNOSIS — Z90710 Acquired absence of both cervix and uterus: Secondary | ICD-10-CM

## 2024-01-14 DIAGNOSIS — Z9049 Acquired absence of other specified parts of digestive tract: Secondary | ICD-10-CM

## 2024-01-14 DIAGNOSIS — N183 Chronic kidney disease, stage 3 unspecified: Secondary | ICD-10-CM | POA: Diagnosis not present

## 2024-01-14 DIAGNOSIS — R001 Bradycardia, unspecified: Secondary | ICD-10-CM | POA: Diagnosis present

## 2024-01-14 DIAGNOSIS — Z809 Family history of malignant neoplasm, unspecified: Secondary | ICD-10-CM

## 2024-01-14 DIAGNOSIS — Z9221 Personal history of antineoplastic chemotherapy: Secondary | ICD-10-CM

## 2024-01-14 DIAGNOSIS — S065X0A Traumatic subdural hemorrhage without loss of consciousness, initial encounter: Secondary | ICD-10-CM | POA: Diagnosis not present

## 2024-01-14 DIAGNOSIS — Z7984 Long term (current) use of oral hypoglycemic drugs: Secondary | ICD-10-CM

## 2024-01-14 DIAGNOSIS — F419 Anxiety disorder, unspecified: Secondary | ICD-10-CM | POA: Diagnosis present

## 2024-01-14 DIAGNOSIS — I6200 Nontraumatic subdural hemorrhage, unspecified: Secondary | ICD-10-CM | POA: Diagnosis not present

## 2024-01-14 DIAGNOSIS — R54 Age-related physical debility: Secondary | ICD-10-CM | POA: Diagnosis present

## 2024-01-14 DIAGNOSIS — S065XAA Traumatic subdural hemorrhage with loss of consciousness status unknown, initial encounter: Secondary | ICD-10-CM | POA: Diagnosis not present

## 2024-01-14 DIAGNOSIS — I428 Other cardiomyopathies: Secondary | ICD-10-CM | POA: Diagnosis present

## 2024-01-14 DIAGNOSIS — Z8719 Personal history of other diseases of the digestive system: Secondary | ICD-10-CM

## 2024-01-14 DIAGNOSIS — Z8042 Family history of malignant neoplasm of prostate: Secondary | ICD-10-CM

## 2024-01-14 DIAGNOSIS — Z888 Allergy status to other drugs, medicaments and biological substances status: Secondary | ICD-10-CM

## 2024-01-14 DIAGNOSIS — Z860101 Personal history of adenomatous and serrated colon polyps: Secondary | ICD-10-CM

## 2024-01-14 DIAGNOSIS — I491 Atrial premature depolarization: Secondary | ICD-10-CM | POA: Diagnosis not present

## 2024-01-14 DIAGNOSIS — I447 Left bundle-branch block, unspecified: Secondary | ICD-10-CM | POA: Diagnosis not present

## 2024-01-14 DIAGNOSIS — Z8489 Family history of other specified conditions: Secondary | ICD-10-CM

## 2024-01-14 DIAGNOSIS — N3000 Acute cystitis without hematuria: Secondary | ICD-10-CM

## 2024-01-14 DIAGNOSIS — Z853 Personal history of malignant neoplasm of breast: Secondary | ICD-10-CM

## 2024-01-14 DIAGNOSIS — Z9011 Acquired absence of right breast and nipple: Secondary | ICD-10-CM

## 2024-01-14 DIAGNOSIS — W19XXXA Unspecified fall, initial encounter: Secondary | ICD-10-CM | POA: Diagnosis present

## 2024-01-14 DIAGNOSIS — Z8701 Personal history of pneumonia (recurrent): Secondary | ICD-10-CM

## 2024-01-14 DIAGNOSIS — I5042 Chronic combined systolic (congestive) and diastolic (congestive) heart failure: Secondary | ICD-10-CM | POA: Diagnosis not present

## 2024-01-14 DIAGNOSIS — K219 Gastro-esophageal reflux disease without esophagitis: Secondary | ICD-10-CM | POA: Diagnosis present

## 2024-01-14 DIAGNOSIS — Z8249 Family history of ischemic heart disease and other diseases of the circulatory system: Secondary | ICD-10-CM

## 2024-01-14 DIAGNOSIS — Z79899 Other long term (current) drug therapy: Secondary | ICD-10-CM

## 2024-01-14 DIAGNOSIS — I10 Essential (primary) hypertension: Secondary | ICD-10-CM | POA: Diagnosis not present

## 2024-01-14 DIAGNOSIS — I959 Hypotension, unspecified: Secondary | ICD-10-CM | POA: Diagnosis present

## 2024-01-14 DIAGNOSIS — S0181XA Laceration without foreign body of other part of head, initial encounter: Secondary | ICD-10-CM | POA: Diagnosis not present

## 2024-01-14 DIAGNOSIS — F32A Depression, unspecified: Secondary | ICD-10-CM | POA: Diagnosis present

## 2024-01-14 DIAGNOSIS — Z7901 Long term (current) use of anticoagulants: Secondary | ICD-10-CM

## 2024-01-14 DIAGNOSIS — Z86718 Personal history of other venous thrombosis and embolism: Secondary | ICD-10-CM

## 2024-01-14 DIAGNOSIS — G9389 Other specified disorders of brain: Secondary | ICD-10-CM | POA: Diagnosis not present

## 2024-01-14 DIAGNOSIS — Z803 Family history of malignant neoplasm of breast: Secondary | ICD-10-CM

## 2024-01-14 DIAGNOSIS — Z86018 Personal history of other benign neoplasm: Secondary | ICD-10-CM

## 2024-01-14 DIAGNOSIS — Z9889 Other specified postprocedural states: Secondary | ICD-10-CM

## 2024-01-14 DIAGNOSIS — Z923 Personal history of irradiation: Secondary | ICD-10-CM

## 2024-01-14 DIAGNOSIS — S02642A Fracture of ramus of left mandible, initial encounter for closed fracture: Secondary | ICD-10-CM | POA: Diagnosis not present

## 2024-01-14 DIAGNOSIS — G319 Degenerative disease of nervous system, unspecified: Secondary | ICD-10-CM | POA: Diagnosis not present

## 2024-01-14 DIAGNOSIS — Z801 Family history of malignant neoplasm of trachea, bronchus and lung: Secondary | ICD-10-CM

## 2024-01-14 DIAGNOSIS — I480 Paroxysmal atrial fibrillation: Secondary | ICD-10-CM | POA: Diagnosis not present

## 2024-01-14 LAB — COMPREHENSIVE METABOLIC PANEL WITH GFR
ALT: 248 U/L — ABNORMAL HIGH (ref 0–44)
AST: 237 U/L — ABNORMAL HIGH (ref 15–41)
Albumin: 3 g/dL — ABNORMAL LOW (ref 3.5–5.0)
Alkaline Phosphatase: 104 U/L (ref 38–126)
Anion gap: 10 (ref 5–15)
BUN: 34 mg/dL — ABNORMAL HIGH (ref 8–23)
CO2: 21 mmol/L — ABNORMAL LOW (ref 22–32)
Calcium: 8.9 mg/dL (ref 8.9–10.3)
Chloride: 108 mmol/L (ref 98–111)
Creatinine, Ser: 1.67 mg/dL — ABNORMAL HIGH (ref 0.44–1.00)
GFR, Estimated: 31 mL/min — ABNORMAL LOW (ref >60)
Glucose, Bld: 112 mg/dL — ABNORMAL HIGH (ref 70–99)
Potassium: 4.5 mmol/L (ref 3.5–5.1)
Sodium: 139 mmol/L (ref 135–145)
Total Bilirubin: 0.3 mg/dL (ref 0.0–1.2)
Total Protein: 7 g/dL (ref 6.5–8.1)

## 2024-01-14 LAB — CBC WITH DIFFERENTIAL/PLATELET
Abs Immature Granulocytes: 0.03 10*3/uL (ref 0.00–0.07)
Basophils Absolute: 0 10*3/uL (ref 0.0–0.1)
Basophils Relative: 0 %
Eosinophils Absolute: 0.1 10*3/uL (ref 0.0–0.5)
Eosinophils Relative: 1 %
HCT: 39 % (ref 36.0–46.0)
Hemoglobin: 12.4 g/dL (ref 12.0–15.0)
Immature Granulocytes: 0 %
Lymphocytes Relative: 16 %
Lymphs Abs: 1.4 10*3/uL (ref 0.7–4.0)
MCH: 26.7 pg (ref 26.0–34.0)
MCHC: 31.8 g/dL (ref 30.0–36.0)
MCV: 84.1 fL (ref 80.0–100.0)
Monocytes Absolute: 0.8 10*3/uL (ref 0.1–1.0)
Monocytes Relative: 9 %
Neutro Abs: 6.2 10*3/uL (ref 1.7–7.7)
Neutrophils Relative %: 74 %
Platelets: 199 10*3/uL (ref 150–400)
RBC: 4.64 MIL/uL (ref 3.87–5.11)
RDW: 15.8 % — ABNORMAL HIGH (ref 11.5–15.5)
WBC: 8.4 10*3/uL (ref 4.0–10.5)
nRBC: 0 % (ref 0.0–0.2)

## 2024-01-14 LAB — URINALYSIS, ROUTINE W REFLEX MICROSCOPIC
Bilirubin Urine: NEGATIVE
Glucose, UA: >=500 mg/dL — AB
Hgb urine dipstick: NEGATIVE
Ketones, ur: NEGATIVE mg/dL
Leukocytes,Ua: NEGATIVE
Nitrite: POSITIVE — AB
Protein, ur: NEGATIVE mg/dL
Specific Gravity, Urine: 1.021 (ref 1.005–1.030)
pH: 5 (ref 5.0–8.0)

## 2024-01-14 LAB — MRSA NEXT GEN BY PCR, NASAL: MRSA by PCR Next Gen: NOT DETECTED

## 2024-01-14 MED ORDER — PANTOPRAZOLE SODIUM 40 MG PO TBEC
40.0000 mg | DELAYED_RELEASE_TABLET | Freq: Two times a day (BID) | ORAL | Status: DC
  Administered 2024-01-14 – 2024-01-19 (×10): 40 mg via ORAL
  Filled 2024-01-14 (×10): qty 1

## 2024-01-14 MED ORDER — CHLORHEXIDINE GLUCONATE CLOTH 2 % EX PADS
6.0000 | MEDICATED_PAD | Freq: Every day | CUTANEOUS | Status: AC
Start: 2024-01-14 — End: ?
  Administered 2024-01-14 – 2024-01-19 (×5): 6 via TOPICAL

## 2024-01-14 MED ORDER — DAPAGLIFLOZIN PROPANEDIOL 10 MG PO TABS: 10.0000 mg | ORAL_TABLET | Freq: Every day | ORAL | Status: DC

## 2024-01-14 MED ORDER — SODIUM CHLORIDE 0.9 % IV SOLN
INTRAVENOUS | Status: DC
  Administered 2024-01-14: 50 mL/h via INTRAVENOUS

## 2024-01-14 MED ORDER — METHOCARBAMOL 500 MG PO TABS
500.0000 mg | ORAL_TABLET | Freq: Three times a day (TID) | ORAL | Status: AC
  Administered 2024-01-15 – 2024-01-17 (×7): 500 mg via ORAL
  Filled 2024-01-14 (×7): qty 1

## 2024-01-14 MED ORDER — METHOCARBAMOL 1000 MG/10ML IJ SOLN
500.0000 mg | Freq: Three times a day (TID) | INTRAMUSCULAR | Status: DC
  Administered 2024-01-14 (×2): 500 mg via INTRAVENOUS
  Filled 2024-01-14 (×2): qty 10

## 2024-01-14 MED ORDER — SERTRALINE HCL 50 MG PO TABS
25.0000 mg | ORAL_TABLET | Freq: Every day | ORAL | Status: DC
  Administered 2024-01-15 – 2024-01-19 (×5): 25 mg via ORAL
  Filled 2024-01-14 (×5): qty 1

## 2024-01-14 MED ORDER — SODIUM CHLORIDE 0.9 % IV SOLN
2.0000 g | Freq: Once | INTRAVENOUS | Status: AC
  Administered 2024-01-14: 2 g via INTRAVENOUS
  Filled 2024-01-14: qty 20

## 2024-01-14 MED ORDER — TETANUS-DIPHTH-ACELL PERTUSSIS 5-2.5-18.5 LF-MCG/0.5 IM SUSY
0.5000 mL | PREFILLED_SYRINGE | Freq: Once | INTRAMUSCULAR | Status: AC
  Administered 2024-01-14: 0.5 mL via INTRAMUSCULAR
  Filled 2024-01-14: qty 0.5

## 2024-01-14 MED ORDER — OXYCODONE HCL 5 MG PO TABS
2.5000 mg | ORAL_TABLET | ORAL | Status: DC | PRN
  Filled 2024-01-14: qty 1

## 2024-01-14 MED ORDER — MORPHINE SULFATE (PF) 2 MG/ML IV SOLN
2.0000 mg | INTRAVENOUS | Status: DC | PRN
  Administered 2024-01-14: 2 mg via INTRAVENOUS
  Filled 2024-01-14: qty 1

## 2024-01-14 MED ORDER — LIDOCAINE HCL (PF) 1 % IJ SOLN
30.0000 mL | Freq: Once | INTRAMUSCULAR | Status: AC
  Administered 2024-01-14: 30 mL
  Filled 2024-01-14: qty 30

## 2024-01-14 MED ORDER — HYDRALAZINE HCL 20 MG/ML IJ SOLN: 10.0000 mg | INTRAMUSCULAR | Status: DC | PRN

## 2024-01-14 MED ORDER — CLONAZEPAM 0.5 MG PO TABS
0.5000 mg | ORAL_TABLET | Freq: Every day | ORAL | Status: DC
  Administered 2024-01-14: 0.5 mg via ORAL
  Filled 2024-01-14: qty 1

## 2024-01-14 MED ORDER — DOCUSATE SODIUM 100 MG PO CAPS
100.0000 mg | ORAL_CAPSULE | Freq: Two times a day (BID) | ORAL | Status: DC
  Administered 2024-01-14 – 2024-01-16 (×5): 100 mg via ORAL
  Filled 2024-01-14 (×5): qty 1

## 2024-01-14 MED ORDER — PROCHLORPERAZINE EDISYLATE 10 MG/2ML IJ SOLN: 5.0000 mg | Freq: Four times a day (QID) | INTRAMUSCULAR | Status: DC | PRN

## 2024-01-14 MED ORDER — LEVETIRACETAM 250 MG PO TABS
500.0000 mg | ORAL_TABLET | Freq: Two times a day (BID) | ORAL | Status: DC
  Administered 2024-01-14 – 2024-01-19 (×10): 500 mg via ORAL
  Filled 2024-01-14 (×10): qty 2

## 2024-01-14 MED ORDER — POLYETHYLENE GLYCOL 3350 17 G PO PACK: 17.0000 g | PACK | Freq: Every day | ORAL | Status: DC | PRN

## 2024-01-14 MED ORDER — ORAL CARE MOUTH RINSE: 15.0000 mL | OROMUCOSAL | Status: DC | PRN

## 2024-01-14 MED ORDER — ACETAMINOPHEN 500 MG PO TABS
1000.0000 mg | ORAL_TABLET | Freq: Four times a day (QID) | ORAL | Status: DC
  Administered 2024-01-14 – 2024-01-15 (×2): 1000 mg via ORAL
  Filled 2024-01-14 (×2): qty 2

## 2024-01-14 MED ORDER — LACOSAMIDE 50 MG PO TABS
50.0000 mg | ORAL_TABLET | Freq: Two times a day (BID) | ORAL | Status: DC
  Administered 2024-01-14 – 2024-01-19 (×10): 50 mg via ORAL
  Filled 2024-01-14 (×10): qty 1

## 2024-01-14 MED ORDER — OXYCODONE HCL 5 MG PO TABS
5.0000 mg | ORAL_TABLET | ORAL | Status: DC | PRN
  Administered 2024-01-14 – 2024-01-18 (×6): 5 mg via ORAL
  Filled 2024-01-14 (×6): qty 1
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
## 2024-01-14 NOTE — Consult Note (Signed)
 Advanced Heart Failure Team Consult Note   Primary Physician: Towanda Fret, MD Cardiologist:  None  Reason for Consultation: Fall post medication changes  HPI:    Tara [name] is seen today for evaluation of fall post recent med changes at the request of Marlin Simmonds, Trauma PA.   Tara [name] is a 78 y.o. AAF with chronic systolic HF, hx of SVT, CKD stage 3 and persistent a fib.    Patient was initially found to have low EF in 2014. Echo 6/17: EF 35-40%. Cardiolite in 8/17 showed fixed apical defect but no evidence for ischemia. She was admitted in 1/18 with PNA, DVT, and CHF exacerbation.  Echo 1/18 EF down to 20-25%. She was diuresed and discharged.    Patient was diagnosed with breast cancer in 2008 and had lumpectomy, radiation, and chemotherapy which included epirubicin, and anthracycline.  Recurrent cancer in 2019 with right mastectomy and chemo with cyclophosphamide , MTX, and fluorouracil .     Patient was in atrial fibrillation with RVR in 6/23, converted back to NSR with amiodarone .    Hospitalized for aspiration PNA 9/24.    Follow up 10/24, in atrial fibrillation and mildly volume overloaded. Lasix  increased to 40 daily, Farxiga  started and arranged for repeat echo and DCCV.  DCCV 08/06/23 to NSR   She was seen in the AHF clinic 12/15/23 and was started on 12.5 mg of spiro. She was later started on rosuvastatin  for LDL at 169. She states someone else recently prescribed a sleeping medication and was also instructed to take an OTC one at the same time. Due to a combination of these medications she has been feeling weaker and intermittently dizzy. She has had 2 falls in the last week but was able to graduate from cardiac rehab yesterday. This morning she was going to accompany her husband to one of his appts and unfortunately her legs gave out and she fell. Upon falling she lacerated her chin. Bleeding was able to be stopped (she is on eliquis ). Imaging revealed a  frontotemporal SDH and nondisplaced fracture of L mandible. Otherwise labs reviewed relatively stable. SCr 1.67, Na 139, K 4.5, AST/ALT 237/248, Hgb 12.4. +UTI.   Husband at bedside. Patient resting comfortably in stretcher. Denies CP/SOB. Only tenderness to mandible.     Cardiac studies reviewed:  Echo 3/25: EF is in the 40% range with normal RV, IVC normal.  Echo 11/24 EF 30-35%, RV mildly reduced. Echo 7/23 EF 40-45%, D-shaped septum, moderate RV dysfunction with mild RV enlargement, IVC normal.   Echo 5/21 EF 45%, global hypokinesis, mildly decreased RV systolic function.   Echo 7/22 EF 55% with normal RV and IVC.   RHC/LHC 10/18: no significant CAD and mildly elevate PCWP.   Cardiac MRI 11/18 EF 49%, no LGE.   Home Medications Prior to Admission medications   Medication Sig Start Date End Date Taking? Authorizing Provider  acetaminophen  (TYLENOL ) 500 MG tablet Take 1,000 mg by mouth every 6 (six) hours as needed for moderate pain (pain score 4-6) or mild pain (pain score 1-3).   Yes [provider]  amiodarone  (PACERONE ) 200 MG tablet Take 1 tablet (200 mg total) by mouth daily. 09/04/23  Yes Lee, [place], NP  apixaban  (ELIQUIS ) 5 MG TABS tablet Take 1 tablet (5 mg total) by mouth 2 (two) times daily. 09/04/23  Yes Lee, [place], NP  bisacodyl  5 MG EC tablet One tablet every 3 days, if needed, for constipation Patient taking differently: Take 5  mg by mouth daily as needed for moderate constipation or mild constipation. 08/11/23  Yes Towanda Fret, MD  clonazePAM  (KLONOPIN ) 0.5 MG tablet Take 1 tablet (0.5 mg total) by mouth at bedtime. 09/01/23  Yes Towanda Fret, MD  docusate sodium  (COLACE) 100 MG capsule Take 1 capsule (100 mg total) by mouth 2 (two) times daily. 07/22/23  Yes Towanda Fret, MD  FARXIGA  10 MG TABS tablet Take 1 tablet (10 mg total) by mouth daily before breakfast. 09/04/23  Yes Lee, [place], NP  fluticasone  (FLONASE ) 50 MCG/ACT nasal spray  Place 2 sprays into both nostrils daily. Patient taking differently: Place 2 sprays into both nostrils daily as needed for rhinitis or allergies. 10/07/23  Yes Rochester Chuck, MD  furosemide  (LASIX ) 20 MG tablet Take 1 tablet (20 mg total) by mouth daily. 08/07/23  Yes Milford, Arlice Bene, FNP  hydrocortisone  (ANUSOL -HC) 2.5 % rectal cream APPLY RECTALLY TWICE DAILY. Patient taking differently: Place 1 Application rectally 2 (two) times daily as needed for hemorrhoids or anal itching. 11/20/23  Yes Towanda Fret, MD  hydrOXYzine  (ATARAX ) 10 MG tablet Take one tablet at bedtime for itching Patient taking differently: Take 10 mg by mouth at bedtime. 12/02/23  Yes Towanda Fret, MD  lacosamide  (VIMPAT ) 50 MG TABS tablet Take 1 tablet (50 mg total) by mouth 2 (two) times daily. 07/02/23 03/28/24 Yes Camara, Nancye Azure, MD  levETIRAcetam  (KEPPRA ) 500 MG tablet Take 1 tablet (500 mg total) by mouth 2 (two) times daily. 07/02/23 06/26/24 Yes Cassandra Cleveland, MD  montelukast  (SINGULAIR ) 10 MG tablet Take 1 tablet (10 mg total) by mouth at bedtime. 10/07/23  Yes Rochester Chuck, MD  pantoprazole  (PROTONIX ) 40 MG tablet TAKE ONE TABLET BY MOUTH TWICE DAILY 12/28/23  Yes Towanda Fret, MD  polyethylene glycol (MIRALAX ) 17 g packet Take one packet mira lax once daily in 8 ounces water  Patient taking differently: Take 17 g by mouth daily as needed for moderate constipation, mild constipation or severe constipation. 07/22/23  Yes Towanda Fret, MD  potassium chloride  SA (KLOR-CON  M) 20 MEQ tablet Take 1 tablet (20 mEq total) by mouth daily. 08/07/23  Yes Milford, Arlice Bene, FNP  rosuvastatin  (CRESTOR ) 10 MG tablet Take 1 tablet (10 mg total) by mouth daily. Patient taking differently: Take 10 mg by mouth at bedtime. 12/18/23 03/17/24 Yes Darlis Eisenmenger, MD  sertraline  (ZOLOFT ) 25 MG tablet TAKE 1 TABLET BY MOUTH ONCE A DAY. 12/28/23  Yes Towanda Fret, MD  spironolactone  (ALDACTONE ) 25  MG tablet Take 0.5 tablets (12.5 mg total) by mouth daily. 12/15/23  Yes Darlis Eisenmenger, MD    Past Medical History: 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   Angio-edema    Anxiety    Breast cancer (HCC) 2008   right - s/p lumpectomy->chemo, radiation   Depression    Dysrhythmia    hx SVT   Family history of colon cancer    Family history of prostate cancer    GERD (gastroesophageal reflux disease)    H/O: hysterectomy    Hiatal hernia    History of cancer chemotherapy    History of radiation therapy    Hyperlipidemia    Hypertension 01/25/2018   Nonischemic cardiomyopathy (HCC)    Personal history of radiation therapy 01/05/209   SVT (supraventricular tachycardia) (HCC)    short RP SVT documented 5/14   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HCC)  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)   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    Past Surgical History: Past Surgical History:  Procedure Laterality Date   ABDOMINAL HYSTERECTOMY  1994   fibroids,    BIOPSY  06/19/2016   Procedure: BIOPSY;  Surgeon: Suzette Espy, MD;  Location: [facility];  Service: Endoscopy;;  gastric duodenum   BREAST BIOPSY Right 12/16/2006   malignant   BREAST EXCISIONAL BIOPSY Right 2018   benign lumpectomy   BREAST LUMPECTOMY Right 02/2007   BREAST LUMPECTOMY WITH RADIOACTIVE SEED LOCALIZATION Right 07/29/2017   Procedure: RIGHT BREAST LUMPECTOMY WITH RADIOACTIVE SEED LOCALIZATION;  Surgeon: Boyce Byes, MD;  Location: [facility] OR;  Service: General;  Laterality: Right;   BREAST SURGERY Right 2008   lumpectomy, cancer   CARDIAC CATHETERIZATION     CARDIOVERSION N/A 08/06/2023   Procedure: CARDIOVERSION (CATH LAB);  Surgeon: Darlis Eisenmenger, MD;  Location: [facility] INVASIVE CV LAB;  Service: Cardiovascular;  Laterality: N/A;   CHOLECYSTECTOMY  1999   COLONOSCOPY  2008   Dr.  Nolene Baumgarten: multiple polyps. Path not available at time of visit.    COLONOSCOPY WITH PROPOFOL  N/A 04/25/2014   Dr. Nolene Baumgarten: Multiple tubular adenomas removed. Diverticulosis. Moderate internal hemorrhoids. Next colonoscopy planned for August 2018.   CRANIOTOMY Bilateral 03/24/2022   Procedure: Bifrontal craniotomy for resection of meningioma;  Surgeon: Audie Bleacher, MD;  Location: [facility] OR;  Service: Neurosurgery;  Laterality: Bilateral;   ESOPHAGOGASTRODUODENOSCOPY (EGD) WITH PROPOFOL  N/A 06/19/2016   Procedure: ESOPHAGOGASTRODUODENOSCOPY (EGD) WITH PROPOFOL ;  Surgeon: Suzette Espy, MD;  Location: [facility];  Service: Endoscopy;  Laterality: N/A;   MASTECTOMY W/ SENTINEL NODE BIOPSY Right 06/09/2018   Procedure: RIGHT TOTAL MASTECTOMY WITH SENTINEL LYMPH NODE BIOPSY;  Surgeon: Boyce Byes, MD;  Location: [facility] OR;  Service: General;  Laterality: Right;   POLYPECTOMY N/A 04/25/2014   Procedure: POLYPECTOMY;  Surgeon: Alyce Jubilee, MD;  Location: [facility] OR[facility];  Service: Endoscopy;  Laterality: N/A;  Ascending and Decending Colon x3 , Transverse colon x2, rectal   PORTACATH PLACEMENT N/A 06/09/2018   Procedure: INSERTION PORT-A-CATH;  Surgeon: Boyce Byes, MD;  Location: [facility] OR;  Service: General;  Laterality: N/A;   RIGHT/LEFT HEART CATH AND CORONARY ANGIOGRAPHY N/A 07/10/2017   Procedure: RIGHT/LEFT HEART CATH AND CORONARY ANGIOGRAPHY;  Surgeon: Darlis Eisenmenger, MD;  Location: [facility] INVASIVE CV LAB;  Service: Cardiovascular;  Laterality: N/A;    Family History: Family History  Problem Relation Age of Onset   Dementia Mother    Stroke Mother 71       left hemiparesis   Colon cancer Father 77       died at age 65   Breast cancer Sister    Hypertension Sister    Heart disease Sister    Cancer Sister        unknown form   Cancer Paternal Aunt        NOS   Other Paternal Aunt        old age   Lung cancer Paternal Uncle    Other Paternal Uncle        lightning strike   Other Paternal  Uncle        hit by train   Cancer Cousin        NOS pat first cousin   Cancer Cousin        NOS pat  first cousin   Prostate cancer Cousin        pat first cousin   Cancer Brother 76       prostate    Social History: Social History   Socioeconomic History   Marital status: Married    Spouse name: Not on file   Number of children: 2   Years of education: Not on file   Highest education level: Not on file  Occupational History   Occupation: disabled     Employer: UNEMPLOYED  Tobacco Use   Smoking status: Never   Smokeless tobacco: Never  Vaping Use   Vaping status: Never Used  Substance and Sexual Activity   Alcohol use: No   Drug use: No   Sexual activity: Not on file  Other Topics Concern   Not on file  Social History Narrative   Lives in [place] with family.  Does not routinely exercise. Right handed   Caffeine-none   Social Drivers of Health   Financial Resource Strain: Low Risk  (07/07/2022)   Received from [facility], [facility]   Overall Financial Resource Strain (CARDIA)    Difficulty of Paying Living Expenses: Not hard at all  Food Insecurity: Patient Declined (06/19/2023)   Hunger Vital Sign    Worried About Running Out of Food in the Last Year: Patient declined    Ran Out of Food in the Last Year: Patient declined  Transportation Needs: Patient Declined (06/19/2023)   PRAPARE - Administrator, Civil Service (Medical): Patient declined    Lack of Transportation (Non-Medical): Patient declined  Physical Activity: Insufficiently Active (05/01/2021)   Exercise Vital Sign    Days of Exercise per Week: 3 days    Minutes of Exercise per Session: 30 min  Stress: No Stress Concern Present (07/07/2022)   Received from [facility], [facility]   [employer] [facility] - Occupational Stress Questionnaire    Feeling of Stress : Not at all  Social Connections: Unknown (07/06/2022)   Received from [facility], [facility]   Social Network    Social Network: Not on file   Allergies:  Allergies  Allergen Reactions   Aspirin  Other (See Comments)    Reports GI bleed-   Lisinopril  Cough   Sudafed [Pseudoephedrine Hcl] Other (See Comments)    Pt reports she had drainage in throat that made her throat hurt.    Objective:    Vital Signs:   Temp:  [96.4 F (35.8 C)] 96.4 F (35.8 C) (04/24 1111) Pulse Rate:  [35-62] 53 (04/24 1500) Resp:  [16-25] 22 (04/24 1500) BP: (126-140)/(55-68) 140/64 (04/24 1500) SpO2:  [93 %-98 %] 96 % (04/24 1500) Weight:  [64 kg] 64 kg (04/24 1109)   Weight change: Filed Weights   01/14/24 1109  Weight: 64 kg   Intake/Output:  No intake or output data in the 24 hours ending 01/14/24 1535   Physical Exam  General:  elderly / frail appearing.  No respiratory difficulty HEENT: +laceration to L mandible Neck: supple. JVD flat.  Cor: PMI nondisplaced. Regular rate & rhythm. No rubs, gallops or murmurs. Lungs: clear Extremities: no cyanosis, clubbing, rash, non-pitting BLE edema  Neuro: alert & oriented x 3. Moves all 4 extremities w/o difficulty. Affect pleasant.   Telemetry   NSR/SB 50-60s (Personally reviewed)    EKG    SB 50s  Labs   Basic Metabolic Panel: Recent Labs  Lab 01/14/24 1151  NA 139  K 4.5  CL 108  CO2 21*  GLUCOSE 112*  BUN 34*  CREATININE 1.67*  CALCIUM  8.9    Liver Function Tests: Recent Labs  Lab 01/14/24 1151  AST 237*  ALT 248*  ALKPHOS 104  BILITOT 0.3  PROT 7.0  ALBUMIN  3.0*   No results for input(s): "LIPASE", "AMYLASE" in the last 168 hours. No results for input(s): "AMMONIA" in the last 168 hours.  CBC: Recent Labs  Lab 01/14/24 1151  WBC 8.4  NEUTROABS 6.2  HGB 12.4  HCT 39.0  MCV 84.1  PLT 199    Cardiac Enzymes: No results for input(s): "CKTOTAL", "CKMB", "CKMBINDEX", "TROPONINI" in the last 168 hours.  BNP: BNP (last 3 results) Recent Labs    08/07/23 1453 09/04/23 1610 12/15/23 1242   BNP 332.4* 102.1* 355.2*    ProBNP (last 3 results) No results for input(s): "PROBNP" in the last 8760 hours.   CBG: No results for input(s): "GLUCAP" in the last 168 hours.  Coagulation Studies: No results for input(s): "LABPROT", "INR" in the last 72 hours.   Imaging   DG Knee 2 Views Right Result Date: 01/14/2024 CLINICAL DATA:  Right knee pain after fall. EXAM: RIGHT KNEE - 1-2 VIEW COMPARISON:  March 08, 2016. FINDINGS: No evidence of fracture, dislocation, or joint effusion. No evidence of arthropathy. Mild patellar spurring is noted. Soft tissues are unremarkable. IMPRESSION: No acute abnormality seen. Electronically Signed   By: Rosalene Colon M.D.   On: 01/14/2024 14:21   DG Knee 2 Views Left Result Date: 01/14/2024 CLINICAL DATA:  Left knee pain after fall. EXAM: LEFT KNEE - 1-2 VIEW COMPARISON:  None Available. FINDINGS: No evidence of fracture, dislocation, or joint effusion. No evidence of arthropathy. Minimal patellar spurring is noted. Soft tissues are unremarkable. IMPRESSION: No acute abnormality seen. Electronically Signed   By: Rosalene Colon M.D.   On: 01/14/2024 14:20   CT Head Wo Contrast Result Date: 01/14/2024 CLINICAL DATA:  Fall and head trauma. EXAM: CT HEAD WITHOUT CONTRAST CT MAXILLOFACIAL WITHOUT CONTRAST CT CERVICAL SPINE WITHOUT CONTRAST TECHNIQUE: Multidetector CT imaging of the head, cervical spine, and maxillofacial structures were performed using the standard protocol without intravenous contrast. Multiplanar CT image reconstructions of the cervical spine and maxillofacial structures were also generated. RADIATION DOSE REDUCTION: This exam was performed according to the departmental dose-optimization program which includes automated exposure control, adjustment of the mA and/or kV according to patient size and/or use of iterative reconstruction technique. COMPARISON:  None Available. FINDINGS: CT HEAD FINDINGS Brain: There is mild age-related atrophy and  chronic microvascular ischemic changes. 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 Small right frontotemporal subdural hematoma measures approximately 4 mm in thickness. No mass effect or midline shift. Vascular: No hyperdense vessel or unexpected calcification. Skull: No acute calvarial pathology.  Frontal craniotomy. Other: None CT MAXILLOFACIAL FINDINGS Osseous: Nondisplaced fracture of the anterior left mandible involving the alveolar ridge of the left lateral incisor and canine teeth. There is fracture of the left lateral incisor tooth. Air pocket adjacent to the roots of the left central and lateral incisor and canine teeth suggestive of loosening. Right mandibular first molar tooth. Orbits: The globes and retro fat are preserved. Sinuses: Diffuse mucoperiosteal thickening and opacification of paranasal sinuses. No air-fluid level. The mastoid air cells are clear. Soft tissues: Soft tissue swelling of the lips. There is laceration of the skin over the chin. CT CERVICAL SPINE FINDINGS Alignment: No acute subluxation. Skull base and vertebrae: No acute fracture. Soft  tissues and spinal canal: No prevertebral fluid or swelling. No visible canal hematoma. Disc levels:  No acute findings.  Degenerative changes. Upper chest: Negative. Other: None IMPRESSION: 1. Small right frontotemporal subdural hematoma. No mass effect or midline shift. 2. Nondisplaced fracture of the anterior left mandible involving the alveolar ridges of the lateral incisor and canine teeth. Fracture of the left lateral incisor tooth. 3. No acute/traumatic cervical spine pathology. These results were called by telephone at the time of interpretation on 01/14/2024 at 1:51 pm to Auston Blush, MD, who verbally acknowledged these results. Electronically Signed   By: Angus Bark M.D.   On: 01/14/2024 13:54   CT Maxillofacial Wo Contrast Result Date: 01/14/2024 CLINICAL DATA:  Fall and head trauma. EXAM: CT HEAD WITHOUT CONTRAST  CT MAXILLOFACIAL WITHOUT CONTRAST CT CERVICAL SPINE WITHOUT CONTRAST TECHNIQUE: Multidetector CT imaging of the head, cervical spine, and maxillofacial structures were performed using the standard protocol without intravenous contrast. Multiplanar CT image reconstructions of the cervical spine and maxillofacial structures were also generated. RADIATION DOSE REDUCTION: This exam was performed according to the departmental dose-optimization program which includes automated exposure control, adjustment of the mA and/or kV according to patient size and/or use of iterative reconstruction technique. COMPARISON:  None Available. FINDINGS: CT HEAD FINDINGS Brain: There is mild age-related atrophy and chronic microvascular ischemic changes. 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 Small right frontotemporal subdural hematoma measures approximately 4 mm in thickness. No mass effect or midline shift. Vascular: No hyperdense vessel or unexpected calcification. Skull: No acute calvarial pathology.  Frontal craniotomy. Other: None CT MAXILLOFACIAL FINDINGS Osseous: Nondisplaced fracture of the anterior left mandible involving the alveolar ridge of the left lateral incisor and canine teeth. There is fracture of the left lateral incisor tooth. Air pocket adjacent to the roots of the left central and lateral incisor and canine teeth suggestive of loosening. Right mandibular first molar tooth. Orbits: The globes and retro fat are preserved. Sinuses: Diffuse mucoperiosteal thickening and opacification of paranasal sinuses. No air-fluid level. The mastoid air cells are clear. Soft tissues: Soft tissue swelling of the lips. There is laceration of the skin over the chin. CT CERVICAL SPINE FINDINGS Alignment: No acute subluxation. Skull base and vertebrae: No acute fracture. Soft tissues and spinal canal: No prevertebral fluid or swelling. No visible canal hematoma. Disc levels:  No acute findings.  Degenerative changes.  Upper chest: Negative. Other: None IMPRESSION: 1. Small right frontotemporal subdural hematoma. No mass effect or midline shift. 2. Nondisplaced fracture of the anterior left mandible involving the alveolar ridges of the lateral incisor and canine teeth. Fracture of the left lateral incisor tooth. 3. No acute/traumatic cervical spine pathology. These results were called by telephone at the time of interpretation on 01/14/2024 at 1:51 pm to Auston Blush, MD, who verbally acknowledged these results. Electronically Signed   By: Angus Bark M.D.   On: 01/14/2024 13:54   CT Cervical Spine Wo Contrast Result Date: 01/14/2024 CLINICAL DATA:  Fall and head trauma. EXAM: CT HEAD WITHOUT CONTRAST CT MAXILLOFACIAL WITHOUT CONTRAST CT CERVICAL SPINE WITHOUT CONTRAST TECHNIQUE: Multidetector CT imaging of the head, cervical spine, and maxillofacial structures were performed using the standard protocol without intravenous contrast. Multiplanar CT image reconstructions of the cervical spine and maxillofacial structures were also generated. RADIATION DOSE REDUCTION: This exam was performed according to the departmental dose-optimization program which includes automated exposure control, adjustment of the mA and/or kV according to patient size and/or use of iterative reconstruction technique. COMPARISON:  None Available. FINDINGS: CT HEAD FINDINGS Brain: There is mild age-related atrophy and chronic microvascular ischemic changes. 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 Small right frontotemporal subdural hematoma measures approximately 4 mm in thickness. No mass effect or midline shift. Vascular: No hyperdense vessel or unexpected calcification. Skull: No acute calvarial pathology.  Frontal craniotomy. Other: None CT MAXILLOFACIAL FINDINGS Osseous: Nondisplaced fracture of the anterior left mandible involving the alveolar ridge of the left lateral incisor and canine teeth. There is fracture of the left  lateral incisor tooth. Air pocket adjacent to the roots of the left central and lateral incisor and canine teeth suggestive of loosening. Right mandibular first molar tooth. Orbits: The globes and retro fat are preserved. Sinuses: Diffuse mucoperiosteal thickening and opacification of paranasal sinuses. No air-fluid level. The mastoid air cells are clear. Soft tissues: Soft tissue swelling of the lips. There is laceration of the skin over the chin. CT CERVICAL SPINE FINDINGS Alignment: No acute subluxation. Skull base and vertebrae: No acute fracture. Soft tissues and spinal canal: No prevertebral fluid or swelling. No visible canal hematoma. Disc levels:  No acute findings.  Degenerative changes. Upper chest: Negative. Other: None IMPRESSION: 1. Small right frontotemporal subdural hematoma. No mass effect or midline shift. 2. Nondisplaced fracture of the anterior left mandible involving the alveolar ridges of the lateral incisor and canine teeth. Fracture of the left lateral incisor tooth. 3. No acute/traumatic cervical spine pathology. These results were called by telephone at the time of interpretation on 01/14/2024 at 1:51 pm to Auston Blush, MD, who verbally acknowledged these results. Electronically Signed   By: Angus Bark M.D.   On: 01/14/2024 13:54     Medications:     Current Medications:   Infusions:     Patient Profile   NAILAH LUEPKE is a 78 y.o. AAF with chronic systolic HF, hx of SVT, CKD stage 3 and persistent a fib. Admitted with fall on Eliquis  post medication changed. AHF to see.   Assessment/Plan  Fall >> SDH L mandible fx - Suspect 2/2 polypharmacy (spiro and 2 different sleeping pills). SDH 2/2 fall on Eliquis  - Imaging revealed a frontotemporal SDH and nondisplaced fracture of L mandible - Trauma following - Neurosurgery has been consulted.  - Holding eliquis , suspect she may need repeat imaging  Chronic mid-range HF (previously systolic) - Echo 4/09 EF ~ 40%  range with normal RV, IVC normal.  - NYHA class II. Appears euvolemic. Low BP and CKD has limited GDMT.  - Continue Lasix  20 mg every other day.   - Stop SGLT2i with UTI  - Would not restart Spiro with falls after starting though suspect it could be from a combination of recent med changes.  - Can use hydralazine  PRN for SBP >150  CKD stage 3 - Baseline Scr 1.8 - 1.67 today, stable - off SGLT2i at this time with UTI  Hx short RP SVT - No reoccurrences. Continue amiodarone   Persistent a fib - Persistent.  S/p DC-CV 11/24. She remains in NSR on amiodarone . - Hold Eliquis  with SDH - Continue amiodarone  200 mg daily.     Elevated LFTs - Have been following OP. ?d/t amio vs CHF - Hold statin with LFTs elevated at this time.  - Recently started for LDL >100.  - Refer to lipid clinic OP  UTI - Continue Rocephin . Per primary team - Stop SGLT2i  Length of Stay: 0  Sheryl Donna, NP  01/14/2024, 3:35 PM    Advanced Heart  Failure Team Pager [phone] (M-F; 7a - 5p)  Please contact [facility] for night-coverage after hours (4p -7a ) and weekends on [email or web address]

## 2024-01-14 NOTE — ED Provider Notes (Addendum)
 [place] EMERGENCY DEPARTMENT AT [facility] Provider Note   CSN: [id number] Arrival date & time: 01/14/24  1100     History  Chief Complaint  Patient presents with   Tara [name]    Tara [name] is a 78 y.o. female with a history of CHF, atrial fibrillation, breast cancer, and TB who presents to the ED today after a fall. Patient reports she was getting up to go to the bathroom when she fell forward. Patient hit her face and knees on the ground. Husband at bedside witnessed the fall, no LOC. Patient is on Eliquis  for a-fib. Endorses pain at the left chin, where she has a laceration, and pain to the bilateral knees. Husband reports she has had additional falls the past several days and has bruising to the right knee from that. No headaches, vision changes, or weakness.    Home Medications Prior to Admission medications   Medication Sig Start Date End Date Taking? Authorizing Provider  acetaminophen  (TYLENOL ) 500 MG tablet Take 1,000 mg by mouth every 6 (six) hours as needed for moderate pain (pain score 4-6) or mild pain (pain score 1-3).   Yes [provider]  amiodarone  (PACERONE ) 200 MG tablet Take 1 tablet (200 mg total) by mouth daily. 09/04/23  Yes Lee, [place], NP  apixaban  (ELIQUIS ) 5 MG TABS tablet Take 1 tablet (5 mg total) by mouth 2 (two) times daily. 09/04/23  Yes Lee, [place], NP  bisacodyl  5 MG EC tablet One tablet every 3 days, if needed, for constipation Patient taking differently: Take 5 mg by mouth daily as needed for moderate constipation or mild constipation. 08/11/23  Yes Towanda Fret, MD  clonazePAM  (KLONOPIN ) 0.5 MG tablet Take 1 tablet (0.5 mg total) by mouth at bedtime. 09/01/23  Yes Towanda Fret, MD  docusate sodium  (COLACE) 100 MG capsule Take 1 capsule (100 mg total) by mouth 2 (two) times daily. 07/22/23  Yes Towanda Fret, MD  FARXIGA  10 MG TABS tablet Take 1 tablet (10 mg total) by mouth daily before breakfast. 09/04/23  Yes  Lee, [place], NP  fluticasone  (FLONASE ) 50 MCG/ACT nasal spray Place 2 sprays into both nostrils daily. Patient taking differently: Place 2 sprays into both nostrils daily as needed for rhinitis or allergies. 10/07/23  Yes Rochester Chuck, MD  furosemide  (LASIX ) 20 MG tablet Take 1 tablet (20 mg total) by mouth daily. 08/07/23  Yes Milford, Jessica M, FNP  hydrocortisone  (ANUSOL -HC) 2.5 % rectal cream APPLY RECTALLY TWICE DAILY. Patient taking differently: Place 1 Application rectally 2 (two) times daily as needed for hemorrhoids or anal itching. 11/20/23  Yes Towanda Fret, MD  hydrOXYzine  (ATARAX ) 10 MG tablet Take one tablet at bedtime for itching Patient taking differently: Take 10 mg by mouth at bedtime. 12/02/23  Yes Towanda Fret, MD  lacosamide  (VIMPAT ) 50 MG TABS tablet Take 1 tablet (50 mg total) by mouth 2 (two) times daily. 07/02/23 03/28/24 Yes Cassandra Cleveland, MD  levETIRAcetam  (KEPPRA ) 500 MG tablet Take 1 tablet (500 mg total) by mouth 2 (two) times daily. 07/02/23 06/26/24 Yes Cassandra Cleveland, MD  montelukast  (SINGULAIR ) 10 MG tablet Take 1 tablet (10 mg total) by mouth at bedtime. 10/07/23  Yes Rochester Chuck, MD  pantoprazole  (PROTONIX ) 40 MG tablet TAKE ONE TABLET BY MOUTH TWICE DAILY 12/28/23  Yes Towanda Fret, MD  polyethylene glycol (MIRALAX ) 17 g packet Take one packet mira lax once daily in 8 ounces water  Patient taking differently: Take  17 g by mouth daily as needed for moderate constipation, mild constipation or severe constipation. 07/22/23  Yes Towanda Fret, MD  potassium chloride  SA (KLOR-CON  M) 20 MEQ tablet Take 1 tablet (20 mEq total) by mouth daily. 08/07/23  Yes Milford, Arlice Bene, FNP  rosuvastatin  (CRESTOR ) 10 MG tablet Take 1 tablet (10 mg total) by mouth daily. Patient taking differently: Take 10 mg by mouth at bedtime. 12/18/23 03/17/24 Yes Darlis Eisenmenger, MD  sertraline  (ZOLOFT ) 25 MG tablet TAKE 1 TABLET BY MOUTH ONCE A DAY. 12/28/23   Yes Towanda Fret, MD  spironolactone  (ALDACTONE ) 25 MG tablet Take 0.5 tablets (12.5 mg total) by mouth daily. 12/15/23  Yes Darlis Eisenmenger, MD      Allergies    Aspirin , Lisinopril , and Sudafed [pseudoephedrine hcl]    Review of Systems   Review of Systems  Skin:  Positive for wound.  All other systems reviewed and are negative.   Physical Exam Updated Vital Signs BP (!) 140/64   Pulse (!) 53   Temp (!) 96.4 F (35.8 C) (Axillary)   Resp (!) 22   Ht 4\' 10"  (1.473 m)   Wt 64 kg   SpO2 96%   BMI 29.47 kg/m  Physical Exam Vitals and nursing note reviewed.  Constitutional:      Appearance: Normal appearance.  HENT:     Head: Normocephalic.     Comments: 7 cm laceration underneath the chin.  Tenderness to palpation of the left jaw.    Mouth/Throat:     Mouth: Mucous membranes are moist.  Eyes:     Conjunctiva/sclera: Conjunctivae normal.     Pupils: Pupils are equal, round, and reactive to light.  Cardiovascular:     Rate and Rhythm: Normal rate and regular rhythm.     Pulses: Normal pulses.     Heart sounds: Normal heart sounds.  Pulmonary:     Effort: Pulmonary effort is normal.     Breath sounds: Normal breath sounds.  Abdominal:     Palpations: Abdomen is soft.     Tenderness: There is no abdominal tenderness.  Musculoskeletal:        General: Normal range of motion.  Skin:    General: Skin is warm and dry.     Findings: No rash.  Neurological:     General: No focal deficit present.     Mental Status: She is alert. Mental status is at baseline.     Sensory: No sensory deficit.     Motor: No weakness.  Psychiatric:        Mood and Affect: Mood normal.        Behavior: Behavior normal.     ED Results / Procedures / Treatments   Labs (all labs ordered are listed, but only abnormal results are displayed) Labs Reviewed  COMPREHENSIVE METABOLIC PANEL WITH GFR - Abnormal; Notable for the following components:      Result Value   CO2 21 (*)     Glucose, Bld 112 (*)    BUN 34 (*)    Creatinine, Ser 1.67 (*)    Albumin  3.0 (*)    AST 237 (*)    ALT 248 (*)    GFR, Estimated 31 (*)    All other components within normal limits  CBC WITH DIFFERENTIAL/PLATELET - Abnormal; Notable for the following components:   RDW 15.8 (*)    All other components within normal limits  URINALYSIS, ROUTINE W REFLEX MICROSCOPIC - Abnormal; Notable for  the following components:   APPearance HAZY (*)    Glucose, UA >=500 (*)    Nitrite POSITIVE (*)    Bacteria, UA MANY (*)    All other components within normal limits  URINE CULTURE    EKG None  Radiology DG Knee 2 Views Right Result Date: 01/14/2024 CLINICAL DATA:  Right knee pain after fall. EXAM: RIGHT KNEE - 1-2 VIEW COMPARISON:  March 08, 2016. FINDINGS: No evidence of fracture, dislocation, or joint effusion. No evidence of arthropathy. Mild patellar spurring is noted. Soft tissues are unremarkable. IMPRESSION: No acute abnormality seen. Electronically Signed   By: Rosalene Colon M.D.   On: 01/14/2024 14:21   DG Knee 2 Views Left Result Date: 01/14/2024 CLINICAL DATA:  Left knee pain after fall. EXAM: LEFT KNEE - 1-2 VIEW COMPARISON:  None Available. FINDINGS: No evidence of fracture, dislocation, or joint effusion. No evidence of arthropathy. Minimal patellar spurring is noted. Soft tissues are unremarkable. IMPRESSION: No acute abnormality seen. Electronically Signed   By: Rosalene Colon M.D.   On: 01/14/2024 14:20   CT Head Wo Contrast Result Date: 01/14/2024 CLINICAL DATA:  Fall and head trauma. EXAM: CT HEAD WITHOUT CONTRAST CT MAXILLOFACIAL WITHOUT CONTRAST CT CERVICAL SPINE WITHOUT CONTRAST TECHNIQUE: Multidetector CT imaging of the head, cervical spine, and maxillofacial structures were performed using the standard protocol without intravenous contrast. Multiplanar CT image reconstructions of the cervical spine and maxillofacial structures were also generated. RADIATION DOSE REDUCTION:  This exam was performed according to the departmental dose-optimization program which includes automated exposure control, adjustment of the mA and/or kV according to patient size and/or use of iterative reconstruction technique. COMPARISON:  None Available. FINDINGS: CT HEAD FINDINGS Brain: There is mild age-related atrophy and chronic microvascular ischemic changes. 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 Small right frontotemporal subdural hematoma measures approximately 4 mm in thickness. No mass effect or midline shift. Vascular: No hyperdense vessel or unexpected calcification. Skull: No acute calvarial pathology.  Frontal craniotomy. Other: None CT MAXILLOFACIAL FINDINGS Osseous: Nondisplaced fracture of the anterior left mandible involving the alveolar ridge of the left lateral incisor and canine teeth. There is fracture of the left lateral incisor tooth. Air pocket adjacent to the roots of the left central and lateral incisor and canine teeth suggestive of loosening. Right mandibular first molar tooth. Orbits: The globes and retro fat are preserved. Sinuses: Diffuse mucoperiosteal thickening and opacification of paranasal sinuses. No air-fluid level. The mastoid air cells are clear. Soft tissues: Soft tissue swelling of the lips. There is laceration of the skin over the chin. CT CERVICAL SPINE FINDINGS Alignment: No acute subluxation. Skull base and vertebrae: No acute fracture. Soft tissues and spinal canal: No prevertebral fluid or swelling. No visible canal hematoma. Disc levels:  No acute findings.  Degenerative changes. Upper chest: Negative. Other: None IMPRESSION: 1. Small right frontotemporal subdural hematoma. No mass effect or midline shift. 2. Nondisplaced fracture of the anterior left mandible involving the alveolar ridges of the lateral incisor and canine teeth. Fracture of the left lateral incisor tooth. 3. No acute/traumatic cervical spine pathology. These results were  called by telephone at the time of interpretation on 01/14/2024 at 1:51 pm to Auston Blush, MD, who verbally acknowledged these results. Electronically Signed   By: Angus Bark M.D.   On: 01/14/2024 13:54   CT Maxillofacial Wo Contrast Result Date: 01/14/2024 CLINICAL DATA:  Fall and head trauma. EXAM: CT HEAD WITHOUT CONTRAST CT MAXILLOFACIAL WITHOUT CONTRAST CT CERVICAL  SPINE WITHOUT CONTRAST TECHNIQUE: Multidetector CT imaging of the head, cervical spine, and maxillofacial structures were performed using the standard protocol without intravenous contrast. Multiplanar CT image reconstructions of the cervical spine and maxillofacial structures were also generated. RADIATION DOSE REDUCTION: This exam was performed according to the departmental dose-optimization program which includes automated exposure control, adjustment of the mA and/or kV according to patient size and/or use of iterative reconstruction technique. COMPARISON:  None Available. FINDINGS: CT HEAD FINDINGS Brain: There is mild age-related atrophy and chronic microvascular ischemic changes. 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 Small right frontotemporal subdural hematoma measures approximately 4 mm in thickness. No mass effect or midline shift. Vascular: No hyperdense vessel or unexpected calcification. Skull: No acute calvarial pathology.  Frontal craniotomy. Other: None CT MAXILLOFACIAL FINDINGS Osseous: Nondisplaced fracture of the anterior left mandible involving the alveolar ridge of the left lateral incisor and canine teeth. There is fracture of the left lateral incisor tooth. Air pocket adjacent to the roots of the left central and lateral incisor and canine teeth suggestive of loosening. Right mandibular first molar tooth. Orbits: The globes and retro fat are preserved. Sinuses: Diffuse mucoperiosteal thickening and opacification of paranasal sinuses. No air-fluid level. The mastoid air cells are clear. Soft  tissues: Soft tissue swelling of the lips. There is laceration of the skin over the chin. CT CERVICAL SPINE FINDINGS Alignment: No acute subluxation. Skull base and vertebrae: No acute fracture. Soft tissues and spinal canal: No prevertebral fluid or swelling. No visible canal hematoma. Disc levels:  No acute findings.  Degenerative changes. Upper chest: Negative. Other: None IMPRESSION: 1. Small right frontotemporal subdural hematoma. No mass effect or midline shift. 2. Nondisplaced fracture of the anterior left mandible involving the alveolar ridges of the lateral incisor and canine teeth. Fracture of the left lateral incisor tooth. 3. No acute/traumatic cervical spine pathology. These results were called by telephone at the time of interpretation on 01/14/2024 at 1:51 pm to Auston Blush, MD, who verbally acknowledged these results. Electronically Signed   By: Angus Bark M.D.   On: 01/14/2024 13:54   CT Cervical Spine Wo Contrast Result Date: 01/14/2024 CLINICAL DATA:  Fall and head trauma. EXAM: CT HEAD WITHOUT CONTRAST CT MAXILLOFACIAL WITHOUT CONTRAST CT CERVICAL SPINE WITHOUT CONTRAST TECHNIQUE: Multidetector CT imaging of the head, cervical spine, and maxillofacial structures were performed using the standard protocol without intravenous contrast. Multiplanar CT image reconstructions of the cervical spine and maxillofacial structures were also generated. RADIATION DOSE REDUCTION: This exam was performed according to the departmental dose-optimization program which includes automated exposure control, adjustment of the mA and/or kV according to patient size and/or use of iterative reconstruction technique. COMPARISON:  None Available. FINDINGS: CT HEAD FINDINGS Brain: There is mild age-related atrophy and chronic microvascular ischemic changes. 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 Small right frontotemporal subdural hematoma measures approximately 4 mm in thickness. No mass effect  or midline shift. Vascular: No hyperdense vessel or unexpected calcification. Skull: No acute calvarial pathology.  Frontal craniotomy. Other: None CT MAXILLOFACIAL FINDINGS Osseous: Nondisplaced fracture of the anterior left mandible involving the alveolar ridge of the left lateral incisor and canine teeth. There is fracture of the left lateral incisor tooth. Air pocket adjacent to the roots of the left central and lateral incisor and canine teeth suggestive of loosening. Right mandibular first molar tooth. Orbits: The globes and retro fat are preserved. Sinuses: Diffuse mucoperiosteal thickening and opacification of paranasal sinuses. No air-fluid level. The mastoid air cells  are clear. Soft tissues: Soft tissue swelling of the lips. There is laceration of the skin over the chin. CT CERVICAL SPINE FINDINGS Alignment: No acute subluxation. Skull base and vertebrae: No acute fracture. Soft tissues and spinal canal: No prevertebral fluid or swelling. No visible canal hematoma. Disc levels:  No acute findings.  Degenerative changes. Upper chest: Negative. Other: None IMPRESSION: 1. Small right frontotemporal subdural hematoma. No mass effect or midline shift. 2. Nondisplaced fracture of the anterior left mandible involving the alveolar ridges of the lateral incisor and canine teeth. Fracture of the left lateral incisor tooth. 3. No acute/traumatic cervical spine pathology. These results were called by telephone at the time of interpretation on 01/14/2024 at 1:51 pm to Auston Blush, MD, who verbally acknowledged these results. Electronically Signed   By: Angus Bark M.D.   On: 01/14/2024 13:54    Procedures .Laceration Repair  Date/Time: 01/14/2024 3:36 PM  Performed by: Sonnie Dusky, PA-C Authorized by: Sonnie Dusky, PA-C   Consent:    Consent obtained:  Verbal   Consent given by:  Patient Universal protocol:    Patient identity confirmed:  Verbally with patient Anesthesia:    Anesthesia method:   Local infiltration   Local anesthetic:  Lidocaine  1% w/o epi Laceration details:    Location:  Face   Face location:  Chin   Length (cm):  7 Treatment:    Area cleansed with:  Saline Skin repair:    Repair method:  Sutures   Suture size:  5-0   Suture material:  Prolene   Suture technique:  Simple interrupted   Number of sutures:  6 Approximation:    Approximation:  Close Repair type:    Repair type:  Simple Post-procedure details:    Procedure completion:  Tolerated .Critical Care  Performed by: Sonnie Dusky, PA-C Authorized by: Sonnie Dusky, PA-C   Critical care provider statement:    Critical care time (minutes):  30   Critical care was necessary to treat or prevent imminent or life-threatening deterioration of the following conditions:  Trauma   Critical care was time spent personally by me on the following activities:  Development of treatment plan with patient or surrogate, discussions with consultants, evaluation of patient's response to treatment, examination of patient, ordering and review of laboratory studies, ordering and review of radiographic studies, ordering and performing treatments and interventions, pulse oximetry, re-evaluation of patient's condition and review of old charts   Care discussed with: admitting provider       Medications Ordered in ED Medications  cefTRIAXone  (ROCEPHIN ) 2 g in sodium chloride  0.9 % 100 mL IVPB (has no administration in time range)  lidocaine  (PF) (XYLOCAINE ) 1 % injection 30 mL (30 mLs Infiltration Given 01/14/24 1225)  Tdap (BOOSTRIX ) injection 0.5 mL (0.5 mLs Intramuscular Given 01/14/24 1225)    ED Course/ Medical Decision Making/ A&P                                 Medical Decision Making Amount and/or Complexity of Data Reviewed Labs: ordered. Radiology: ordered.  Risk Prescription drug management. Decision regarding hospitalization.   This patient presents to the ED for concern of fall, this involves an  extensive number of treatment options, and is a complaint that carries with it a high risk of complications and morbidity.   Differential diagnosis includes: SAH, SDH, EDH, concussion, hematoma, laceration, abrasion, etc.   Comorbidities  See HPI above  Additional History  Additional history obtained from prior records   Lab Tests  I ordered and personally interpreted labs.  The pertinent results include:   CMP and CBC are reassuring UA shows many bacteria and positive nitrates.  Urine culture pending.   Imaging Studies  I ordered imaging studies including CT head, CT maxillofacial, CT cervical spine, left and right knee x-rays I independently visualized and interpreted imaging which showed:  CT head showed small right frontotemporal subdural hematoma. No mass effect or midline shift. Nondisplaced fracture of anterior left mandible involving alveolar ridges of lateral incisor and canine teeth. Fracture of lateral left incisor tooth. Non acute/traumatic cervical spine pathology. No acute abnormality seen on bilateral knee x-rays. I agree with the radiologist interpretation   Consultations  I requested consultation with Alyce Baba with general surgery/trauma,  and discussed lab and imaging findings as well as pertinent plan - they recommend: admission for further evaluation and workup. Consult ENT for mandible fracture. I requested consultation with Dr. Larrie Po from neurosurgery,  and discussed lab and imaging findings as well as pertinent plan - they recommend: he will come down to see patient.  I requested consultation with Dr. Virgia Griffins from ENT,  and discussed lab and imaging findings as well as pertinent plan - they recommend: He spoke with radiology.  States that fracture is not although through the mandible.  He recommended consulting oral surgery. Spoke with trauma provider who states that we cannot admit patient to trauma service and they will consult inpatient dentist.   Advised for close lacerations in the ED today.   Problem List / ED Course / Critical Interventions / Medication Management  Patient's tripped and fell at home, walking to the bathroom, fell forward and hit her head.  No LOC.  Patient is on Eliquis  for A-fib.  Endorses some pain to the left side of her face.  Has displaced left lower teeth, per husband.  Bilateral knee pain as well.  Has had other falls the past couple days, which is why she has bruising on the right medial knee. She is neurovascularly intact. Patient endorses pain with swallowing.  She is still able to swallow secretions and breathe normally.  Will close she is developing a hematoma to the left side of her neck.  Evaluated by trauma surgery.  They said that they would monitor but no additional imaging is needed at this time. I ordered medications including: Tdap updated Ceftriaxone  for UTI I have reviewed the patients home medicines and have made adjustments as needed   Social Determinants of Health  Physical activity   Test / Admission - Considered  Patient is agreeable with plan for admission.       Final Clinical Impression(s) / ED Diagnoses Final diagnoses:  Closed fracture of left side of mandibular alveolar process, initial encounter (HCC)  Subdural hematoma (HCC)  Acute cystitis without hematuria    Rx / DC Orders ED Discharge Orders     None         Sonnie Dusky, PA-C 01/14/24 1547    Sonnie Dusky, PA-C 01/14/24 1548    Auston Blush, MD 01/22/24 1125

## 2024-01-14 NOTE — ED Triage Notes (Addendum)
 Patient had a fall and is on blood thinners large lac to chin and not other complaints Reports some top teeth are loose

## 2024-01-14 NOTE — Consult Note (Signed)
 Providing Compassionate, Quality Care - Together   Reason for Consult: Subdural hematoma Referring Physician: Sonnie Dusky, PA  Tara [name] is an 78 y.o. female.  HPI: Tara [name] is a 78 year old female with a past medical history outlined below. She saw her cardiologist, Dr. Mitzie Anda, about two weeks ago. He made some adjustments to her medications and she has felt weak when standing since. Earlier today, she suffered a ground level fall when she stood up after going to the bathroom. She felt like her legs gave out and she fell forward, striking her chin. She is on Eliquis  for a-fib. She sustained a nondisplaced fx of the anterior left mandible and a small right frontoparietal subdural hematoma. Presently, her only complaint is of mouth and chin pain. She denies headache, vision change, weakness, or changes in speech.  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   Angio-edema    Anxiety    Breast cancer (HCC) 2008   right - s/p lumpectomy->chemo, radiation   Depression    Dysrhythmia    hx SVT   Family history of colon cancer    Family history of prostate cancer    GERD (gastroesophageal reflux disease)    H/O: hysterectomy    Hiatal hernia    History of cancer chemotherapy    History of radiation therapy    Hyperlipidemia    Hypertension 01/25/2018   Nonischemic cardiomyopathy (HCC)    Personal history of radiation therapy 01/05/209   SVT (supraventricular tachycardia) (HCC)    short RP SVT documented 5/14   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HCC)    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)   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    Past Surgical History:  Procedure Laterality Date   ABDOMINAL HYSTERECTOMY  1994   fibroids,    BIOPSY  06/19/2016   Procedure: BIOPSY;  Surgeon: Suzette Espy, MD;  Location: [facility];  Service: Endoscopy;;   gastric duodenum   BREAST BIOPSY Right 12/16/2006   malignant   BREAST EXCISIONAL BIOPSY Right 2018   benign lumpectomy   BREAST LUMPECTOMY Right 02/2007   BREAST LUMPECTOMY WITH RADIOACTIVE SEED LOCALIZATION Right 07/29/2017   Procedure: RIGHT BREAST LUMPECTOMY WITH RADIOACTIVE SEED LOCALIZATION;  Surgeon: Boyce Byes, MD;  Location: [facility] OR;  Service: General;  Laterality: Right;   BREAST SURGERY Right 2008   lumpectomy, cancer   CARDIAC CATHETERIZATION     CARDIOVERSION N/A 08/06/2023   Procedure: CARDIOVERSION (CATH LAB);  Surgeon: Darlis Eisenmenger, MD;  Location: [facility] INVASIVE CV LAB;  Service: Cardiovascular;  Laterality: N/A;   CHOLECYSTECTOMY  1999   COLONOSCOPY  2008   Dr. Nolene Baumgarten: multiple polyps. Path not available at time of visit.    COLONOSCOPY WITH PROPOFOL  N/A 04/25/2014   Dr. Nolene Baumgarten: Multiple tubular adenomas removed. Diverticulosis. Moderate internal hemorrhoids. Next colonoscopy planned for August 2018.   CRANIOTOMY Bilateral 03/24/2022   Procedure: Bifrontal craniotomy for resection of meningioma;  Surgeon: Audie Bleacher, MD;  Location: [facility] OR;  Service: Neurosurgery;  Laterality: Bilateral;   ESOPHAGOGASTRODUODENOSCOPY (EGD) WITH PROPOFOL  N/A 06/19/2016   Procedure: ESOPHAGOGASTRODUODENOSCOPY (EGD) WITH PROPOFOL ;  Surgeon: Suzette Espy, MD;  Location: [facility];  Service: Endoscopy;  Laterality: N/A;   MASTECTOMY W/ SENTINEL NODE BIOPSY Right 06/09/2018   Procedure: RIGHT TOTAL MASTECTOMY WITH SENTINEL LYMPH  NODE BIOPSY;  Surgeon: Boyce Byes, MD;  Location: [facility] OR;  Service: General;  Laterality: Right;   POLYPECTOMY N/A 04/25/2014   Procedure: POLYPECTOMY;  Surgeon: Alyce Jubilee, MD;  Location: [facility];  Service: Endoscopy;  Laterality: N/A;  Ascending and Decending Colon x3 , Transverse colon x2, rectal   PORTACATH PLACEMENT N/A 06/09/2018   Procedure: INSERTION PORT-A-CATH;  Surgeon: Boyce Byes, MD;  Location: [facility] OR;  Service: General;  Laterality:  N/A;   RIGHT/LEFT HEART CATH AND CORONARY ANGIOGRAPHY N/A 07/10/2017   Procedure: RIGHT/LEFT HEART CATH AND CORONARY ANGIOGRAPHY;  Surgeon: Darlis Eisenmenger, MD;  Location: [facility] INVASIVE CV LAB;  Service: Cardiovascular;  Laterality: N/A;    Family History  Problem Relation Age of Onset   Dementia Mother    Stroke Mother 60       left hemiparesis   Colon cancer Father 59       died at age 28   Breast cancer Sister    Hypertension Sister    Heart disease Sister    Cancer Sister        unknown form   Cancer Paternal Aunt        NOS   Other Paternal Aunt        old age   Lung cancer Paternal Uncle    Other Paternal Uncle        lightning strike   Other Paternal Uncle        hit by train   Cancer Cousin        NOS pat first cousin   Cancer Cousin        NOS pat first cousin   Prostate cancer Cousin        pat first cousin   Cancer Brother 64       prostate    Social History:  reports that she has never smoked. She has never used smokeless tobacco. She reports that she does not drink alcohol and does not use drugs.  Allergies:  Allergies  Allergen Reactions   Aspirin  Other (See Comments)    Reports GI bleed-   Lisinopril  Cough   Sudafed [Pseudoephedrine Hcl] Other (See Comments)    Pt reports she had drainage in throat that made her throat hurt.     Medications: I have reviewed the patient's current medications.  Results for orders placed or performed during the hospital encounter of 01/14/24 (from the past 48 hours)  Comprehensive metabolic panel     Status: Abnormal   Collection Time: 01/14/24 11:51 AM  Result Value Ref Range   Sodium 139 135 - 145 mmol/L   Potassium 4.5 3.5 - 5.1 mmol/L   Chloride 108 98 - 111 mmol/L   CO2 21 (L) 22 - 32 mmol/L   Glucose, Bld 112 (H) 70 - 99 mg/dL    Comment: Glucose reference range applies only to samples taken after fasting for at least 8 hours.   BUN 34 (H) 8 - 23 mg/dL   Creatinine, Ser 1.61 (H) 0.44 - 1.00 mg/dL    Calcium  8.9 8.9 - 10.3 mg/dL   Total Protein 7.0 6.5 - 8.1 g/dL   Albumin  3.0 (L) 3.5 - 5.0 g/dL   AST 096 (H) 15 - 41 U/L   ALT 248 (H) 0 - 44 U/L   Alkaline Phosphatase 104 38 - 126 U/L   Total Bilirubin 0.3 0.0 - 1.2 mg/dL   GFR, Estimated 31 (L) >60 mL/min    Comment: (NOTE) Calculated using  the CKD-EPI Creatinine Equation (2021)    Anion gap 10 5 - 15    Comment: Performed at [facility], [address], [place], [place] [address]  CBC with Differential     Status: Abnormal   Collection Time: 01/14/24 11:51 AM  Result Value Ref Range   WBC 8.4 4.0 - 10.5 K/uL   RBC 4.64 3.87 - 5.11 MIL/uL   Hemoglobin 12.4 12.0 - 15.0 g/dL   HCT 14.7 82.9 - 56.2 %   MCV 84.1 80.0 - 100.0 fL   MCH 26.7 26.0 - 34.0 pg   MCHC 31.8 30.0 - 36.0 g/dL   RDW 13.0 (H) 86.5 - 78.4 %   Platelets 199 150 - 400 K/uL   nRBC 0.0 0.0 - 0.2 %   Neutrophils Relative % 74 %   Neutro Abs 6.2 1.7 - 7.7 K/uL   Lymphocytes Relative 16 %   Lymphs Abs 1.4 0.7 - 4.0 K/uL   Monocytes Relative 9 %   Monocytes Absolute 0.8 0.1 - 1.0 K/uL   Eosinophils Relative 1 %   Eosinophils Absolute 0.1 0.0 - 0.5 K/uL   Basophils Relative 0 %   Basophils Absolute 0.0 0.0 - 0.1 K/uL   Immature Granulocytes 0 %   Abs Immature Granulocytes 0.03 0.00 - 0.07 K/uL    Comment: Performed at [facility], [address] [place], [place] [address]  Urinalysis, Routine w reflex microscopic -Urine, Clean Catch     Status: Abnormal   Collection Time: 01/14/24 11:57 AM  Result Value Ref Range   Color, Urine YELLOW YELLOW   APPearance HAZY (A) CLEAR   Specific Gravity, Urine 1.021 1.005 - 1.030   pH 5.0 5.0 - 8.0   Glucose, UA >=500 (A) NEGATIVE mg/dL   Hgb urine dipstick NEGATIVE NEGATIVE   Bilirubin Urine NEGATIVE NEGATIVE   Ketones, ur NEGATIVE NEGATIVE mg/dL   Protein, ur NEGATIVE NEGATIVE mg/dL   Nitrite POSITIVE (A) NEGATIVE   Leukocytes,Ua NEGATIVE NEGATIVE   RBC / HPF 0-5 0 - 5 RBC/hpf   WBC, UA 11-20 0 - 5  WBC/hpf   Bacteria, UA MANY (A) NONE SEEN   Squamous Epithelial / HPF 6-10 0 - 5 /HPF    Comment: Performed at [facility], [address], [place], [place] [address]   *Note: Due to a large number of results and/or encounters for the requested time period, some results have not been displayed. A complete set of results can be found in Results Review.    DG Knee 2 Views Right Result Date: 01/14/2024 CLINICAL DATA:  Right knee pain after fall. EXAM: RIGHT KNEE - 1-2 VIEW COMPARISON:  March 08, 2016. FINDINGS: No evidence of fracture, dislocation, or joint effusion. No evidence of arthropathy. Mild patellar spurring is noted. Soft tissues are unremarkable. IMPRESSION: No acute abnormality seen. Electronically Signed   By: Rosalene Colon M.D.   On: 01/14/2024 14:21   DG Knee 2 Views Left Result Date: 01/14/2024 CLINICAL DATA:  Left knee pain after fall. EXAM: LEFT KNEE - 1-2 VIEW COMPARISON:  None Available. FINDINGS: No evidence of fracture, dislocation, or joint effusion. No evidence of arthropathy. Minimal patellar spurring is noted. Soft tissues are unremarkable. IMPRESSION: No acute abnormality seen. Electronically Signed   By: Rosalene Colon M.D.   On: 01/14/2024 14:20   CT Head Wo Contrast Result Date: 01/14/2024 CLINICAL DATA:  Fall and head trauma. EXAM: CT HEAD WITHOUT CONTRAST CT MAXILLOFACIAL WITHOUT  CONTRAST CT CERVICAL SPINE WITHOUT CONTRAST TECHNIQUE: Multidetector CT imaging of the head, cervical spine, and maxillofacial structures were performed using the standard protocol without intravenous contrast. Multiplanar CT image reconstructions of the cervical spine and maxillofacial structures were also generated. RADIATION DOSE REDUCTION: This exam was performed according to the departmental dose-optimization program which includes automated exposure control, adjustment of the mA and/or kV according to patient size and/or use of iterative reconstruction technique. COMPARISON:  None  Available. FINDINGS: CT HEAD FINDINGS Brain: There is mild age-related atrophy and chronic microvascular ischemic changes. 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 Small right frontotemporal subdural hematoma measures approximately 4 mm in thickness. No mass effect or midline shift. Vascular: No hyperdense vessel or unexpected calcification. Skull: No acute calvarial pathology.  Frontal craniotomy. Other: None CT MAXILLOFACIAL FINDINGS Osseous: Nondisplaced fracture of the anterior left mandible involving the alveolar ridge of the left lateral incisor and canine teeth. There is fracture of the left lateral incisor tooth. Air pocket adjacent to the roots of the left central and lateral incisor and canine teeth suggestive of loosening. Right mandibular first molar tooth. Orbits: The globes and retro fat are preserved. Sinuses: Diffuse mucoperiosteal thickening and opacification of paranasal sinuses. No air-fluid level. The mastoid air cells are clear. Soft tissues: Soft tissue swelling of the lips. There is laceration of the skin over the chin. CT CERVICAL SPINE FINDINGS Alignment: No acute subluxation. Skull base and vertebrae: No acute fracture. Soft tissues and spinal canal: No prevertebral fluid or swelling. No visible canal hematoma. Disc levels:  No acute findings.  Degenerative changes. Upper chest: Negative. Other: None IMPRESSION: 1. Small right frontotemporal subdural hematoma. No mass effect or midline shift. 2. Nondisplaced fracture of the anterior left mandible involving the alveolar ridges of the lateral incisor and canine teeth. Fracture of the left lateral incisor tooth. 3. No acute/traumatic cervical spine pathology. These results were called by telephone at the time of interpretation on 01/14/2024 at 1:51 pm to Auston Blush, MD, who verbally acknowledged these results. Electronically Signed   By: Angus Bark M.D.   On: 01/14/2024 13:54   CT Maxillofacial Wo Contrast Result  Date: 01/14/2024 CLINICAL DATA:  Fall and head trauma. EXAM: CT HEAD WITHOUT CONTRAST CT MAXILLOFACIAL WITHOUT CONTRAST CT CERVICAL SPINE WITHOUT CONTRAST TECHNIQUE: Multidetector CT imaging of the head, cervical spine, and maxillofacial structures were performed using the standard protocol without intravenous contrast. Multiplanar CT image reconstructions of the cervical spine and maxillofacial structures were also generated. RADIATION DOSE REDUCTION: This exam was performed according to the departmental dose-optimization program which includes automated exposure control, adjustment of the mA and/or kV according to patient size and/or use of iterative reconstruction technique. COMPARISON:  None Available. FINDINGS: CT HEAD FINDINGS Brain: There is mild age-related atrophy and chronic microvascular ischemic changes. 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 Small right frontotemporal subdural hematoma measures approximately 4 mm in thickness. No mass effect or midline shift. Vascular: No hyperdense vessel or unexpected calcification. Skull: No acute calvarial pathology.  Frontal craniotomy. Other: None CT MAXILLOFACIAL FINDINGS Osseous: Nondisplaced fracture of the anterior left mandible involving the alveolar ridge of the left lateral incisor and canine teeth. There is fracture of the left lateral incisor tooth. Air pocket adjacent to the roots of the left central and lateral incisor and canine teeth suggestive of loosening. Right mandibular first molar tooth. Orbits: The globes and retro fat are preserved. Sinuses: Diffuse mucoperiosteal thickening and opacification of paranasal sinuses. No air-fluid level. The mastoid  air cells are clear. Soft tissues: Soft tissue swelling of the lips. There is laceration of the skin over the chin. CT CERVICAL SPINE FINDINGS Alignment: No acute subluxation. Skull base and vertebrae: No acute fracture. Soft tissues and spinal canal: No prevertebral fluid or  swelling. No visible canal hematoma. Disc levels:  No acute findings.  Degenerative changes. Upper chest: Negative. Other: None IMPRESSION: 1. Small right frontotemporal subdural hematoma. No mass effect or midline shift. 2. Nondisplaced fracture of the anterior left mandible involving the alveolar ridges of the lateral incisor and canine teeth. Fracture of the left lateral incisor tooth. 3. No acute/traumatic cervical spine pathology. These results were called by telephone at the time of interpretation on 01/14/2024 at 1:51 pm to Auston Blush, MD, who verbally acknowledged these results. Electronically Signed   By: Angus Bark M.D.   On: 01/14/2024 13:54   CT Cervical Spine Wo Contrast Result Date: 01/14/2024 CLINICAL DATA:  Fall and head trauma. EXAM: CT HEAD WITHOUT CONTRAST CT MAXILLOFACIAL WITHOUT CONTRAST CT CERVICAL SPINE WITHOUT CONTRAST TECHNIQUE: Multidetector CT imaging of the head, cervical spine, and maxillofacial structures were performed using the standard protocol without intravenous contrast. Multiplanar CT image reconstructions of the cervical spine and maxillofacial structures were also generated. RADIATION DOSE REDUCTION: This exam was performed according to the departmental dose-optimization program which includes automated exposure control, adjustment of the mA and/or kV according to patient size and/or use of iterative reconstruction technique. COMPARISON:  None Available. FINDINGS: CT HEAD FINDINGS Brain: There is mild age-related atrophy and chronic microvascular ischemic changes. 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 Small right frontotemporal subdural hematoma measures approximately 4 mm in thickness. No mass effect or midline shift. Vascular: No hyperdense vessel or unexpected calcification. Skull: No acute calvarial pathology.  Frontal craniotomy. Other: None CT MAXILLOFACIAL FINDINGS Osseous: Nondisplaced fracture of the anterior left mandible involving the  alveolar ridge of the left lateral incisor and canine teeth. There is fracture of the left lateral incisor tooth. Air pocket adjacent to the roots of the left central and lateral incisor and canine teeth suggestive of loosening. Right mandibular first molar tooth. Orbits: The globes and retro fat are preserved. Sinuses: Diffuse mucoperiosteal thickening and opacification of paranasal sinuses. No air-fluid level. The mastoid air cells are clear. Soft tissues: Soft tissue swelling of the lips. There is laceration of the skin over the chin. CT CERVICAL SPINE FINDINGS Alignment: No acute subluxation. Skull base and vertebrae: No acute fracture. Soft tissues and spinal canal: No prevertebral fluid or swelling. No visible canal hematoma. Disc levels:  No acute findings.  Degenerative changes. Upper chest: Negative. Other: None IMPRESSION: 1. Small right frontotemporal subdural hematoma. No mass effect or midline shift. 2. Nondisplaced fracture of the anterior left mandible involving the alveolar ridges of the lateral incisor and canine teeth. Fracture of the left lateral incisor tooth. 3. No acute/traumatic cervical spine pathology. These results were called by telephone at the time of interpretation on 01/14/2024 at 1:51 pm to Auston Blush, MD, who verbally acknowledged these results. Electronically Signed   By: Angus Bark M.D.   On: 01/14/2024 13:54    Review of Systems  Constitutional: Negative.   HENT:         Mouth/chin pain  Eyes:  Negative for blurred vision, double vision and photophobia.  Respiratory: Negative.    Cardiovascular: Negative.   Gastrointestinal: Negative.   Genitourinary: Negative.   Musculoskeletal:  Positive for falls.  Skin: Negative.   Neurological:  Positive  for dizziness. Negative for sensory change, speech change, seizures, loss of consciousness, weakness and headaches.  Endo/Heme/Allergies:  Bruises/bleeds easily.  Psychiatric/Behavioral: Negative.     Blood pressure  (!) 140/64, pulse (!) 53, temperature (!) 96.4 F (35.8 C), temperature source Axillary, resp. rate (!) 22, height 4\' 10"  (1.473 m), weight 64 kg, SpO2 96%. Estimated body mass index is 29.47 kg/m as calculated from the following:   Height as of this encounter: 4\' 10"  (1.473 m).   Weight as of this encounter: 64 kg.  Physical Exam Constitutional:      Appearance: Normal appearance.  HENT:     Head: Normocephalic.     Jaw: Tenderness and pain on movement present.   Cardiovascular:     Rate and Rhythm: Regular rhythm. Bradycardia present.  Pulmonary:     Effort: Pulmonary effort is normal.  Abdominal:     Palpations: Abdomen is soft.  Musculoskeletal:        General: Normal range of motion.     Cervical back: Normal range of motion and neck supple.  Skin:    General: Skin is warm and dry.     Capillary Refill: Capillary refill takes less than 2 seconds.  Neurological:     General: No focal deficit present.     Mental Status: She is alert.  Psychiatric:        Mood and Affect: Mood normal.        Behavior: Behavior normal.        Thought Content: Thought content normal.        Judgment: Judgment normal.     Assessment/Plan: Patient with a small subdural hematoma following a ground level fall. Recommend holding Eliquis . Will repeat CT head in the morning.  I am in communication with my attending and they agree with the plan for this patient.   Henreitta Locus, DNP, AGNP-C Nurse Practitioner  [facility] [address], [place], [place] [address] P: [phone]    F: [phone]  01/14/2024, 4:50 PM

## 2024-01-14 NOTE — H&P (Signed)
 Tara [name] [birth year]  [id number].    Chief Complaint/Reason for Consult: GLF  HPI:  This is a pleasant 78 yo black female with a history of DVT in 2018, breast cancer, GERD, HLD, Chronic systolic CHF with EF of 40-45%, a fib on Eliquis , meningioma (s/p resection by Dr. Michale Age in 2024), h/o aspiration PNA, and CKD stage 3 who states she saw Dr. Mitzie Anda about 2 weeks ago and had some of her medications adjusted.  Since that time she has felt "weak in the knees."  She fell on Sunday apparently.  She graduated from cardiac rehab just yesterday.  Unfortunately this morning she stood up to go to the bathroom and fell.  She denies LOC, lightheadedness, dizziness, etc.   She states her legs just felt weak and gave out on her.  She hit her head on her chin.  She has a 4cm laceration along the left jaw line.  She is having a lot of pain in her jaw from a mandible fracture with some displacement of her teeth on the lower left side.  She admits to some pain in both of her knees, but otherwise no other pain.  She denies have any dysphagia or dysphonia currently, but is having some edema of the left side of her neck.  She denies any pain in her head. On work up, she has been found to have a small frontotemporal SDH as well as nondisplaced fx of the anterior left mandible involving the alveolar ridges of the lateral incisor and canine teeth with a fx of the left lateral incisor tooth.  NSGY has been consulted.  ENT was initially called, but given the location of mandible fx, this would need dentistry, but we don't have dentistry on call here.  B knee films are negative.  We have been asked to see for admission.  ROS: ROS: see HPI  Family History  Problem Relation Age of Onset   Dementia Mother    Stroke Mother 63       left hemiparesis   Colon cancer Father 78       died at age 58   Breast cancer Sister    Hypertension Sister    Heart disease Sister    Cancer Sister        unknown form   Cancer  Paternal Aunt        NOS   Other Paternal Aunt        old age   Lung cancer Paternal Uncle    Other Paternal Uncle        lightning strike   Other Paternal Uncle        hit by train   Cancer Cousin        NOS pat first cousin   Cancer Cousin        NOS pat first cousin   Prostate cancer Cousin        pat first cousin   Cancer Brother 77       prostate    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   Angio-edema    Anxiety    Breast cancer (HCC) 2008   right - s/p lumpectomy->chemo, radiation   Depression    Dysrhythmia    hx SVT   Family history of colon cancer    Family history of prostate cancer    GERD (gastroesophageal reflux disease)    H/O: hysterectomy  Hiatal hernia    History of cancer chemotherapy    History of radiation therapy    Hyperlipidemia    Hypertension 01/25/2018   Nonischemic cardiomyopathy (HCC)    Personal history of radiation therapy 01/05/209   SVT (supraventricular tachycardia) (HCC)    short RP SVT documented 5/14   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HCC)    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)   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    Past Surgical History:  Procedure Laterality Date   ABDOMINAL HYSTERECTOMY  1994   fibroids,    BIOPSY  06/19/2016   Procedure: BIOPSY;  Surgeon: Suzette Espy, MD;  Location: [facility];  Service: Endoscopy;;  gastric duodenum   BREAST BIOPSY Right 12/16/2006   malignant   BREAST EXCISIONAL BIOPSY Right 2018   benign lumpectomy   BREAST LUMPECTOMY Right 02/2007   BREAST LUMPECTOMY WITH RADIOACTIVE SEED LOCALIZATION Right 07/29/2017   Procedure: RIGHT BREAST LUMPECTOMY WITH RADIOACTIVE SEED LOCALIZATION;  Surgeon: Boyce Byes, MD;  Location: [facility] OR;  Service: General;  Laterality: Right;   BREAST SURGERY Right 2008   lumpectomy, cancer   CARDIAC CATHETERIZATION      CARDIOVERSION N/A 08/06/2023   Procedure: CARDIOVERSION (CATH LAB);  Surgeon: Darlis Eisenmenger, MD;  Location: [facility] INVASIVE CV LAB;  Service: Cardiovascular;  Laterality: N/A;   CHOLECYSTECTOMY  1999   COLONOSCOPY  2008   Dr. Nolene Baumgarten: multiple polyps. Path not available at time of visit.    COLONOSCOPY WITH PROPOFOL  N/A 04/25/2014   Dr. Nolene Baumgarten: Multiple tubular adenomas removed. Diverticulosis. Moderate internal hemorrhoids. Next colonoscopy planned for August 2018.   CRANIOTOMY Bilateral 03/24/2022   Procedure: Bifrontal craniotomy for resection of meningioma;  Surgeon: Audie Bleacher, MD;  Location: [facility] OR;  Service: Neurosurgery;  Laterality: Bilateral;   ESOPHAGOGASTRODUODENOSCOPY (EGD) WITH PROPOFOL  N/A 06/19/2016   Procedure: ESOPHAGOGASTRODUODENOSCOPY (EGD) WITH PROPOFOL ;  Surgeon: Suzette Espy, MD;  [facility];  Service: Endoscopy;  Laterality: N/A;   MASTECTOMY W/ SENTINEL NODE BIOPSY Right 06/09/2018   Procedure: RIGHT TOTAL MASTECTOMY WITH SENTINEL LYMPH NODE BIOPSY;  Surgeon: Boyce Byes, MD;  Location: [facility] OR;  Service: General;  Laterality: Right;   POLYPECTOMY N/A 04/25/2014   Procedure: POLYPECTOMY;  Surgeon: Alyce Jubilee, MD;  Location: [facility]S;  Service: Endoscopy;  Laterality: N/A;  Ascending and Decending Colon x3 , Transverse colon x2, rectal   PORTACATH PLACEMENT N/A 06/09/2018   Procedure: INSERTION PORT-A-CATH;  Surgeon: Boyce Byes, MD;  Location: [facility] OR;  Service: General;  Laterality: N/A;   RIGHT/LEFT HEART CATH AND CORONARY ANGIOGRAPHY N/A 07/10/2017   Procedure: RIGHT/LEFT HEART CATH AND CORONARY ANGIOGRAPHY;  Surgeon: Darlis Eisenmenger, MD;  Location: [facility] INVASIVE CV LAB;  Service: Cardiovascular;  Laterality: N/A;    Social History:  reports that she has never smoked. She has never used smokeless tobacco. She reports that she does not drink alcohol and does not use drugs.  Allergies:  Allergies  Allergen Reactions   Aspirin  Other (See  Comments)    Reports GI bleed-   Lisinopril  Cough   Sudafed [Pseudoephedrine Hcl] Other (See Comments)    Pt reports she had drainage in throat that made her throat hurt.     (Not in a hospital admission)    Physical Exam: Blood pressure (!) 140/64, pulse (!) 53, temperature (!) 96.4 F (35.8 C), temperature source Axillary, resp. rate Aaron Aas)  22, height 4\' 10"  (1.473 m), weight 64 kg, SpO2 96%. General: pleasant, WD, WN black female who is laying in bed in NAD HEENT: head is normocephalic, atraumatic, except a 4cm laceration to the inferior aspect of her left jaw line.  No active bleeding at this time.  Sclera are noninjected.  PERRL.  Ears and nose without any masses or lesions.  Mouth is pink and moist.  She is unable to open her mouth for me to exam her teeth and further inside secondary to pain.  Neck is nontender posteriorly with normal ROM.  Anterior neck with some edema and ecchymosis on the left side.  No dysphagia or dysphonia currently. Heart: bradycardic.  Sounds regular, although monitor states she is in A fib.  Normal s1,s2. No obvious murmurs, gallops, or rubs noted.  Palpable radial and pedal pulses bilaterally Lungs: CTAB, no wheezes, rhonchi, or rales noted.  Respiratory effort nonlabored Abd: soft, NT, ND, +BS, no masses, hernias, or organomegaly MS: all 4 extremities are symmetrical with no cyanosis, clubbing, or edema.  She has some old and acute ecchymosis on both of her knees.  She has good ROM of her BLEs as well as BUEs.  No deficits are noted. Skin: warm and dry with no masses, lesions, or rashes Neuro: Cranial nerves 2-12 grossly intact, sensation is normal throughout.  Normal strength in upper and lower extremities. Psych: A&Ox3 with an appropriate affect.   Results for orders placed or performed during the hospital encounter of 01/14/24 (from the past 48 hours)  Comprehensive metabolic panel     Status: Abnormal   Collection Time: 01/14/24 11:51 AM  Result Value  Ref Range   Sodium 139 135 - 145 mmol/L   Potassium 4.5 3.5 - 5.1 mmol/L   Chloride 108 98 - 111 mmol/L   CO2 21 (L) 22 - 32 mmol/L   Glucose, Bld 112 (H) 70 - 99 mg/dL    Comment: Glucose reference range applies only to samples taken after fasting for at least 8 hours.   BUN 34 (H) 8 - 23 mg/dL   Creatinine, Ser 1.61 (H) 0.44 - 1.00 mg/dL   Calcium  8.9 8.9 - 10.3 mg/dL   Total Protein 7.0 6.5 - 8.1 g/dL   Albumin  3.0 (L) 3.5 - 5.0 g/dL   AST 096 (H) 15 - 41 U/L   ALT 248 (H) 0 - 44 U/L   Alkaline Phosphatase 104 38 - 126 U/L   Total Bilirubin 0.3 0.0 - 1.2 mg/dL   GFR, Estimated 31 (L) >60 mL/min    Comment: (NOTE) Calculated using the CKD-EPI Creatinine Equation (2021)    Anion gap 10 5 - 15    Comment: Performed at [facility], [address], [place], [place] [address]  CBC with Differential     Status: Abnormal   Collection Time: 01/14/24 11:51 AM  Result Value Ref Range   WBC 8.4 4.0 - 10.5 K/uL   RBC 4.64 3.87 - 5.11 MIL/uL   Hemoglobin 12.4 12.0 - 15.0 g/dL   HCT 98.1 19.1 - 47.8 %   MCV 84.1 80.0 - 100.0 fL   MCH 26.7 26.0 - 34.0 pg   MCHC 31.8 30.0 - 36.0 g/dL   RDW 29.5 (H) 62.1 - 30.8 %   Platelets 199 150 - 400 K/uL   nRBC 0.0 0.0 - 0.2 %   Neutrophils Relative % 74 %   Neutro Abs 6.2 1.7 - 7.7 K/uL   Lymphocytes Relative 16 %  Lymphs Abs 1.4 0.7 - 4.0 K/uL   Monocytes Relative 9 %   Monocytes Absolute 0.8 0.1 - 1.0 K/uL   Eosinophils Relative 1 %   Eosinophils Absolute 0.1 0.0 - 0.5 K/uL   Basophils Relative 0 %   Basophils Absolute 0.0 0.0 - 0.1 K/uL   Immature Granulocytes 0 %   Abs Immature Granulocytes 0.03 0.00 - 0.07 K/uL    Comment: Performed at [facility], [address] [place], [place] [address]  Urinalysis, Routine w reflex microscopic -Urine, Clean Catch     Status: Abnormal   Collection Time: 01/14/24 11:57 AM  Result Value Ref Range   Color, Urine YELLOW YELLOW   APPearance HAZY (A) CLEAR   Specific Gravity, Urine 1.021  1.005 - 1.030   pH 5.0 5.0 - 8.0   Glucose, UA >=500 (A) NEGATIVE mg/dL   Hgb urine dipstick NEGATIVE NEGATIVE   Bilirubin Urine NEGATIVE NEGATIVE   Ketones, ur NEGATIVE NEGATIVE mg/dL   Protein, ur NEGATIVE NEGATIVE mg/dL   Nitrite POSITIVE (A) NEGATIVE   Leukocytes,Ua NEGATIVE NEGATIVE   RBC / HPF 0-5 0 - 5 RBC/hpf   WBC, UA 11-20 0 - 5 WBC/hpf   Bacteria, UA MANY (A) NONE SEEN   Squamous Epithelial / HPF 6-10 0 - 5 /HPF    Comment: Performed at [facility], [address], [place], [place] [address]   *Note: Due to a large number of results and/or encounters for the requested time period, some results have not been displayed. A complete set of results can be found in Results Review.   DG Knee 2 Views Right Result Date: 01/14/2024 CLINICAL DATA:  Right knee pain after fall. EXAM: RIGHT KNEE - 1-2 VIEW COMPARISON:  March 08, 2016. FINDINGS: No evidence of fracture, dislocation, or joint effusion. No evidence of arthropathy. Mild patellar spurring is noted. Soft tissues are unremarkable. IMPRESSION: No acute abnormality seen. Electronically Signed   By: Rosalene Colon M.D.   On: 01/14/2024 14:21   DG Knee 2 Views Left Result Date: 01/14/2024 CLINICAL DATA:  Left knee pain after fall. EXAM: LEFT KNEE - 1-2 VIEW COMPARISON:  None Available. FINDINGS: No evidence of fracture, dislocation, or joint effusion. No evidence of arthropathy. Minimal patellar spurring is noted. Soft tissues are unremarkable. IMPRESSION: No acute abnormality seen. Electronically Signed   By: Rosalene Colon M.D.   On: 01/14/2024 14:20   CT Head Wo Contrast Result Date: 01/14/2024 CLINICAL DATA:  Fall and head trauma. EXAM: CT HEAD WITHOUT CONTRAST CT MAXILLOFACIAL WITHOUT CONTRAST CT CERVICAL SPINE WITHOUT CONTRAST TECHNIQUE: Multidetector CT imaging of the head, cervical spine, and maxillofacial structures were performed using the standard protocol without intravenous contrast. Multiplanar CT image  reconstructions of the cervical spine and maxillofacial structures were also generated. RADIATION DOSE REDUCTION: This exam was performed according to the departmental dose-optimization program which includes automated exposure control, adjustment of the mA and/or kV according to patient size and/or use of iterative reconstruction technique. COMPARISON:  None Available. FINDINGS: CT HEAD FINDINGS Brain: There is mild age-related atrophy and chronic microvascular ischemic changes. 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 Small right frontotemporal subdural hematoma measures approximately 4 mm in thickness. No mass effect or midline shift. Vascular: No hyperdense vessel or unexpected calcification. Skull: No acute calvarial pathology.  Frontal craniotomy. Other: None CT MAXILLOFACIAL FINDINGS Osseous: Nondisplaced fracture of the anterior left mandible involving the alveolar ridge of the left lateral incisor and canine teeth.  There is fracture of the left lateral incisor tooth. Air pocket adjacent to the roots of the left central and lateral incisor and canine teeth suggestive of loosening. Right mandibular first molar tooth. Orbits: The globes and retro fat are preserved. Sinuses: Diffuse mucoperiosteal thickening and opacification of paranasal sinuses. No air-fluid level. The mastoid air cells are clear. Soft tissues: Soft tissue swelling of the lips. There is laceration of the skin over the chin. CT CERVICAL SPINE FINDINGS Alignment: No acute subluxation. Skull base and vertebrae: No acute fracture. Soft tissues and spinal canal: No prevertebral fluid or swelling. No visible canal hematoma. Disc levels:  No acute findings.  Degenerative changes. Upper chest: Negative. Other: None IMPRESSION: 1. Small right frontotemporal subdural hematoma. No mass effect or midline shift. 2. Nondisplaced fracture of the anterior left mandible involving the alveolar ridges of the lateral incisor and canine teeth.  Fracture of the left lateral incisor tooth. 3. No acute/traumatic cervical spine pathology. These results were called by telephone at the time of interpretation on 01/14/2024 at 1:51 pm to Auston Blush, MD, who verbally acknowledged these results. Electronically Signed   By: Angus Bark M.D.   On: 01/14/2024 13:54   CT Maxillofacial Wo Contrast Result Date: 01/14/2024 CLINICAL DATA:  Fall and head trauma. EXAM: CT HEAD WITHOUT CONTRAST CT MAXILLOFACIAL WITHOUT CONTRAST CT CERVICAL SPINE WITHOUT CONTRAST TECHNIQUE: Multidetector CT imaging of the head, cervical spine, and maxillofacial structures were performed using the standard protocol without intravenous contrast. Multiplanar CT image reconstructions of the cervical spine and maxillofacial structures were also generated. RADIATION DOSE REDUCTION: This exam was performed according to the departmental dose-optimization program which includes automated exposure control, adjustment of the mA and/or kV according to patient size and/or use of iterative reconstruction technique. COMPARISON:  None Available. FINDINGS: CT HEAD FINDINGS Brain: There is mild age-related atrophy and chronic microvascular ischemic changes. 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 Small right frontotemporal subdural hematoma measures approximately 4 mm in thickness. No mass effect or midline shift. Vascular: No hyperdense vessel or unexpected calcification. Skull: No acute calvarial pathology.  Frontal craniotomy. Other: None CT MAXILLOFACIAL FINDINGS Osseous: Nondisplaced fracture of the anterior left mandible involving the alveolar ridge of the left lateral incisor and canine teeth. There is fracture of the left lateral incisor tooth. Air pocket adjacent to the roots of the left central and lateral incisor and canine teeth suggestive of loosening. Right mandibular first molar tooth. Orbits: The globes and retro fat are preserved. Sinuses: Diffuse mucoperiosteal  thickening and opacification of paranasal sinuses. No air-fluid level. The mastoid air cells are clear. Soft tissues: Soft tissue swelling of the lips. There is laceration of the skin over the chin. CT CERVICAL SPINE FINDINGS Alignment: No acute subluxation. Skull base and vertebrae: No acute fracture. Soft tissues and spinal canal: No prevertebral fluid or swelling. No visible canal hematoma. Disc levels:  No acute findings.  Degenerative changes. Upper chest: Negative. Other: None IMPRESSION: 1. Small right frontotemporal subdural hematoma. No mass effect or midline shift. 2. Nondisplaced fracture of the anterior left mandible involving the alveolar ridges of the lateral incisor and canine teeth. Fracture of the left lateral incisor tooth. 3. No acute/traumatic cervical spine pathology. These results were called by telephone at the time of interpretation on 01/14/2024 at 1:51 pm to Auston Blush, MD, who verbally acknowledged these results. Electronically Signed   By: Angus Bark M.D.   On: 01/14/2024 13:54   CT Cervical Spine Wo Contrast Result Date: 01/14/2024  CLINICAL DATA:  Fall and head trauma. EXAM: CT HEAD WITHOUT CONTRAST CT MAXILLOFACIAL WITHOUT CONTRAST CT CERVICAL SPINE WITHOUT CONTRAST TECHNIQUE: Multidetector CT imaging of the head, cervical spine, and maxillofacial structures were performed using the standard protocol without intravenous contrast. Multiplanar CT image reconstructions of the cervical spine and maxillofacial structures were also generated. RADIATION DOSE REDUCTION: This exam was performed according to the departmental dose-optimization program which includes automated exposure control, adjustment of the mA and/or kV according to patient size and/or use of iterative reconstruction technique. COMPARISON:  None Available. FINDINGS: CT HEAD FINDINGS Brain: There is mild age-related atrophy and chronic microvascular ischemic changes. Inferior bifrontal lobe old infarct and  encephalomalacia. Small right frontotemporal subdural hematoma measures approximately 4 mm in thickness. No mass effect or midline shift. Vascular: No hyperdense vessel or unexpected calcification. Skull: No acute calvarial pathology.  Frontal craniotomy. Other: None CT MAXILLOFACIAL FINDINGS Osseous: Nondisplaced fracture of the anterior left mandible involving the alveolar ridge of the left lateral incisor and canine teeth. There is fracture of the left lateral incisor tooth. Air pocket adjacent to the roots of the left central and lateral incisor and canine teeth suggestive of loosening. Right mandibular first molar tooth. Orbits: The globes and retro fat are preserved. Sinuses: Diffuse mucoperiosteal thickening and opacification of paranasal sinuses. No air-fluid level. The mastoid air cells are clear. Soft tissues: Soft tissue swelling of the lips. There is laceration of the skin over the chin. CT CERVICAL SPINE FINDINGS Alignment: No acute subluxation. Skull base and vertebrae: No acute fracture. Soft tissues and spinal canal: No prevertebral fluid or swelling. No visible canal hematoma. Disc levels:  No acute findings.  Degenerative changes. Upper chest: Negative. Other: None IMPRESSION: 1. Small right frontotemporal subdural hematoma. No mass effect or midline shift. 2. Nondisplaced fracture of the anterior left mandible involving the alveolar ridges of the lateral incisor and canine teeth. Fracture of the left lateral incisor tooth. 3. No acute/traumatic cervical spine pathology. These results were called by telephone at the time of interpretation on 01/14/2024 at 1:51 pm to Auston Blush, MD, who verbally acknowledged these results. Electronically Signed   By: Angus Bark M.D.   On: 01/14/2024 13:54      Assessment/Plan GLF SDH - ICU for monitoring.  Dr. Larrie Po aware of patient by EDPA.  He will see and determine if there is a need to reverse her Eliquis  and if she needs a follow up CT in 8  hrs.  She had a meningioma removed last year with a history of seizures.  She has not had any seizures since this surgery, but still takes keppra  BID.  Continue this will she is here.  TBI therapies Mandible fx of aveolar ridge and teeth - will need outpatient dentistry follow up for management of this.   L sided edema and ecchymosis of neck secondary to above - monitor.  Patient denies issues with phonation or trouble swallowing.  No intervention needed, but monitor neck exam A fib on eliquis  - hold Eliquis , per NSGY if this needs reversal CHF, Ef 40-45% - will have HF team see patient as she states she has had increase in weakness and a couple of falls since an adjustment in her meds a couple of weeks ago.  She isn't sure which meds were adjusted.  Hold on some of her home meds until seen by them.  I did restart her Farxiga  (this wasn't adjusted it appears) CKD 3 - gentle fluid hydration with NS 50cc/hr GERD-  BID protonix , home dose History of meningioma/seizures - s/p resection, continue BID Keppra  HLD H/O breast cancer Elevated LFTs - ?amio related per Dr. Charline Contras last note.  These are quite increased in the last 5 months.  Will see what they say. Prediabetes - hgbA1C in 2024 6.2 FEN - CLD, IVFs VTE - hold Eliquis  ID - none needed Admit - inpatient to ICU  I reviewed nursing notes, ED provider notes, Consultant HF team notes, last 24 h vitals and pain scores, last 48 h intake and output, last 24 h labs and trends, and last 24 h imaging results.  Leone Ralphs, [facility] 01/14/2024, 3:43 PM Please see Amion for pager number during day hours 7:00am-4:30pm or 7:00am -11:30am on weekends

## 2024-01-14 NOTE — ED Provider Triage Note (Signed)
 Emergency Medicine Provider Triage Evaluation Note  Tara [name] , a 78 y.o. female  was evaluated in triage.  Pt complains of fall on thinners..    Physical Exam  There were no vitals taken for this visit. Approximately 3 cm laceration below left chin. No midline tenderness on cervical spine. Medical Decision Making  Medically screening exam initiated at 11:04 AM.  Appropriate orders placed.  Tara [name] was informed that the remainder of the evaluation will be completed by another provider, this initial triage assessment does not replace that evaluation, and the importance of remaining in the ED until their evaluation is complete.  Follow thinners.  You should be a level 2 trauma.  Nursing notified.  Pending room in the back.   Mozell Arias, MD 01/14/24 1105

## 2024-01-15 ENCOUNTER — Inpatient Hospital Stay (HOSPITAL_COMMUNITY)

## 2024-01-15 DIAGNOSIS — I5022 Chronic systolic (congestive) heart failure: Secondary | ICD-10-CM | POA: Diagnosis not present

## 2024-01-15 DIAGNOSIS — I495 Sick sinus syndrome: Secondary | ICD-10-CM | POA: Diagnosis not present

## 2024-01-15 DIAGNOSIS — R001 Bradycardia, unspecified: Secondary | ICD-10-CM | POA: Diagnosis not present

## 2024-01-15 DIAGNOSIS — I959 Hypotension, unspecified: Secondary | ICD-10-CM

## 2024-01-15 LAB — CBC
HCT: 36.3 % (ref 36.0–46.0)
Hemoglobin: 11.5 g/dL — ABNORMAL LOW (ref 12.0–15.0)
MCH: 26.6 pg (ref 26.0–34.0)
MCHC: 31.7 g/dL (ref 30.0–36.0)
MCV: 84 fL (ref 80.0–100.0)
Platelets: 178 10*3/uL (ref 150–400)
RBC: 4.32 MIL/uL (ref 3.87–5.11)
RDW: 15.8 % — ABNORMAL HIGH (ref 11.5–15.5)
WBC: 8.4 10*3/uL (ref 4.0–10.5)
nRBC: 0 % (ref 0.0–0.2)

## 2024-01-15 LAB — ECHOCARDIOGRAM LIMITED
Area-P 1/2: 2.69 cm2
Height: 58 in
S' Lateral: 3.4 cm
Weight: 2256 [oz_av]

## 2024-01-15 LAB — BASIC METABOLIC PANEL WITH GFR
Anion gap: 8 (ref 5–15)
BUN: 22 mg/dL (ref 8–23)
CO2: 19 mmol/L — ABNORMAL LOW (ref 22–32)
Calcium: 7.9 mg/dL — ABNORMAL LOW (ref 8.9–10.3)
Chloride: 110 mmol/L (ref 98–111)
Creatinine, Ser: 1.44 mg/dL — ABNORMAL HIGH (ref 0.44–1.00)
GFR, Estimated: 37 mL/min — ABNORMAL LOW (ref >60)
Glucose, Bld: 135 mg/dL — ABNORMAL HIGH (ref 70–99)
Potassium: 4.2 mmol/L (ref 3.5–5.1)
Sodium: 137 mmol/L (ref 135–145)

## 2024-01-15 LAB — CK: Total CK: 213 U/L (ref 38–234)

## 2024-01-15 MED ORDER — MAGNESIUM HYDROXIDE 400 MG/5ML PO SUSP
30.0000 mL | Freq: Once | ORAL | Status: AC
  Administered 2024-01-15: 30 mL via ORAL
  Filled 2024-01-15: qty 30

## 2024-01-15 MED ORDER — AMIODARONE HCL 200 MG PO TABS: 200.0000 mg | ORAL_TABLET | Freq: Every day | ORAL | Status: DC

## 2024-01-15 MED ORDER — ONDANSETRON HCL 4 MG/2ML IJ SOLN
4.0000 mg | Freq: Once | INTRAMUSCULAR | Status: AC
  Administered 2024-01-15: 4 mg via INTRAVENOUS
  Filled 2024-01-15: qty 2

## 2024-01-15 MED ORDER — SODIUM CHLORIDE 0.9 % IV SOLN: Freq: Once | INTRAVENOUS | Status: AC

## 2024-01-15 MED ORDER — SENNA 8.6 MG PO TABS
2.0000 | ORAL_TABLET | Freq: Once | ORAL | Status: AC
  Administered 2024-01-15: 17.2 mg via ORAL
  Filled 2024-01-15: qty 2

## 2024-01-15 MED ORDER — MIDODRINE HCL 5 MG PO TABS
5.0000 mg | ORAL_TABLET | Freq: Three times a day (TID) | ORAL | Status: DC
  Administered 2024-01-15 – 2024-01-19 (×14): 5 mg via ORAL
  Filled 2024-01-15 (×14): qty 1

## 2024-01-15 MED ORDER — CLONAZEPAM 0.5 MG PO TABS
0.5000 mg | ORAL_TABLET | Freq: Every evening | ORAL | Status: DC | PRN
  Administered 2024-01-15 – 2024-01-18 (×2): 0.5 mg via ORAL
  Filled 2024-01-15 (×2): qty 1

## 2024-01-15 MED ORDER — SODIUM CHLORIDE 0.9 % IV BOLUS
500.0000 mL | Freq: Once | INTRAVENOUS | Status: AC
  Administered 2024-01-15: 500 mL via INTRAVENOUS

## 2024-01-15 MED ORDER — BOOST / RESOURCE BREEZE PO LIQD CUSTOM
1.0000 | Freq: Three times a day (TID) | ORAL | Status: DC
  Administered 2024-01-15 – 2024-01-19 (×7): 1 via ORAL

## 2024-01-15 MED ORDER — HYDROXYZINE HCL 10 MG PO TABS
10.0000 mg | ORAL_TABLET | Freq: Every day | ORAL | Status: DC
  Administered 2024-01-15 – 2024-01-18 (×4): 10 mg via ORAL
  Filled 2024-01-15 (×5): qty 1

## 2024-01-15 MED ORDER — DOPAMINE-DEXTROSE 3.2-5 MG/ML-% IV SOLN
2.5000 ug/kg/min | INTRAVENOUS | Status: DC
  Administered 2024-01-15: 3 ug/kg/min via INTRAVENOUS
  Filled 2024-01-15: qty 250

## 2024-01-15 MED ORDER — ACETAMINOPHEN 325 MG PO TABS
650.0000 mg | ORAL_TABLET | Freq: Four times a day (QID) | ORAL | Status: DC
  Administered 2024-01-15 – 2024-01-19 (×16): 650 mg via ORAL
  Filled 2024-01-15 (×16): qty 2

## 2024-01-15 MED ORDER — FUROSEMIDE 20 MG PO TABS
20.0000 mg | ORAL_TABLET | Freq: Every day | ORAL | Status: DC
  Filled 2024-01-15: qty 1

## 2024-01-15 MED ORDER — POLYETHYLENE GLYCOL 3350 17 G PO PACK
17.0000 g | PACK | Freq: Two times a day (BID) | ORAL | Status: DC
  Administered 2024-01-15 – 2024-01-17 (×5): 17 g via ORAL
  Filled 2024-01-15 (×7): qty 1

## 2024-01-15 NOTE — Evaluation (Signed)
 Clinical/Bedside Swallow Evaluation Patient Details  Name: Tara [name] MRN: [id number] Date of Birth: Dec 01, 1945  Today's Date: 01/15/2024 Time: SLP Start Time (ACUTE ONLY): 1524 SLP Stop Time (ACUTE ONLY): 1536 SLP Time Calculation (min) (ACUTE ONLY): 12 min  Past Medical History:  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   Angio-edema    Anxiety    Breast cancer (HCC) 2008   right - s/p lumpectomy->chemo, radiation   Depression    Dysrhythmia    hx SVT   Family history of colon cancer    Family history of prostate cancer    GERD (gastroesophageal reflux disease)    H/O: hysterectomy    Hiatal hernia    History of cancer chemotherapy    History of radiation therapy    Hyperlipidemia    Hypertension 01/25/2018   Nonischemic cardiomyopathy (HCC)    Personal history of radiation therapy 01/05/209   SVT (supraventricular tachycardia) (HCC)    short RP SVT documented 5/14   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HCC)    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)   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   Past Surgical History:  Past Surgical History:  Procedure Laterality Date   ABDOMINAL HYSTERECTOMY  1994   fibroids,    BIOPSY  06/19/2016   Procedure: BIOPSY;  Surgeon: Suzette Espy, MD;  [facility];  Service: Endoscopy;;  gastric duodenum   BREAST BIOPSY Right 12/16/2006   malignant   BREAST EXCISIONAL BIOPSY Right 2018   benign lumpectomy   BREAST LUMPECTOMY Right 02/2007   BREAST LUMPECTOMY WITH RADIOACTIVE SEED LOCALIZATION Right 07/29/2017   Procedure: RIGHT BREAST LUMPECTOMY WITH RADIOACTIVE SEED LOCALIZATION;  Surgeon: Boyce Byes, MD;  Location: [facility] OR;  Service: General;  Laterality: Right;   BREAST SURGERY Right 2008   lumpectomy, cancer   CARDIAC CATHETERIZATION     CARDIOVERSION N/A 08/06/2023   Procedure: CARDIOVERSION  (CATH LAB);  Surgeon: Darlis Eisenmenger, MD;  Location: [facility] INVASIVE CV LAB;  Service: Cardiovascular;  Laterality: N/A;   CHOLECYSTECTOMY  1999   COLONOSCOPY  2008   Dr. Nolene Baumgarten: multiple polyps. Path not available at time of visit.    COLONOSCOPY WITH PROPOFOL  N/A 04/25/2014   Dr. Nolene Baumgarten: Multiple tubular adenomas removed. Diverticulosis. Moderate internal hemorrhoids. Next colonoscopy planned for August 2018.   CRANIOTOMY Bilateral 03/24/2022   Procedure: Bifrontal craniotomy for resection of meningioma;  Surgeon: Audie Bleacher, MD;  Location: [facility] OR;  Service: Neurosurgery;  Laterality: Bilateral;   ESOPHAGOGASTRODUODENOSCOPY (EGD) WITH PROPOFOL  N/A 06/19/2016   Procedure: ESOPHAGOGASTRODUODENOSCOPY (EGD) WITH PROPOFOL ;  Surgeon: Suzette Espy, MD;  [facility];  Service: Endoscopy;  Laterality: N/A;   MASTECTOMY W/ SENTINEL NODE BIOPSY Right 06/09/2018   Procedure: RIGHT TOTAL MASTECTOMY WITH SENTINEL LYMPH NODE BIOPSY;  Surgeon: Boyce Byes, MD;  Location: [facility] OR;  Service: General;  Laterality: Right;   POLYPECTOMY N/A 04/25/2014   Procedure: POLYPECTOMY;  Surgeon: Alyce Jubilee, MD;  Location: [facility]S;  Service: Endoscopy;  Laterality: N/A;  Ascending and Decending Colon x3 , Transverse colon x2, rectal   PORTACATH PLACEMENT N/A 06/09/2018   Procedure: INSERTION PORT-A-CATH;  Surgeon: Boyce Byes, MD;  Location: [facility] OR;  Service: General;  Laterality: N/A;   RIGHT/LEFT HEART CATH AND CORONARY ANGIOGRAPHY N/A 07/10/2017   Procedure: RIGHT/LEFT HEART CATH AND  CORONARY ANGIOGRAPHY;  Surgeon: Darlis Eisenmenger, MD;  Location: [facility] INVASIVE CV LAB;  Service: Cardiovascular;  Laterality: N/A;   HPI:  78 yo presenting after GLF. Admitted with SDH and mandible fx of alveolar ridge and teeth. Previous SLP documentation suggests that she is on a mechanical soft diet at baseline. MBS was done in 05/2023 due to concern for PNA, throat clearing and wet vocal quality with thin liquids, and  hoarse vocal quality. This study showed no penetration or aspiration but there was some pharyngeal residue as well as concern for more of a primary esophageal component. PMH includes: meningioma (s/p craniotomy ), seizures, GERD, HH, aspiration PNA, breast ca (s/p chemo, XRT, mastectomy), CHF, TB with residual lung scarring    Assessment / Plan / Recommendation  Clinical Impression  Pt was seen for a very limited swallow eval secondary to nausea. She completed oral mech exam with generalized weakness noted and some guarding on her L side where she is  injured. She thought she was going to be able to take some POs, but then ultimately only took one large sip of thin liquid via straw, although this did appear to be functional. Per review of chart note that there has been concern in the past for aspiration PNA and esophageal dysphagia. Mild oropharyngeal dysphagia had been observed on MBS on September but no aspiration or penetration. She endorses some coughing with ice chips when she first came in, and that her husband was "worried they were going into her lungs," but she denies any further coughing with POs since that one time. Suspect that she is appropriate to continue on current diet with use of aspiration and esophageal precautions but SLP will f/u for more thorough assessment. If noted to be coughing with POs over the weekend, please reach out to SLP group secure chat.   SLP Visit Diagnosis: Dysphagia, unspecified (R13.10)    Aspiration Risk       Diet Recommendation Dysphagia 3 (Mech soft);Thin liquid    Liquid Administration via: Cup;Straw Medication Administration: Whole meds with puree Supervision: Patient able to self feed;Intermittent supervision to cue for compensatory strategies Compensations: Slow rate;Small sips/bites;Follow solids with liquid Postural Changes: Seated upright at 90 degrees;Remain upright for at least 30 minutes after po intake    Other  Recommendations Oral Care  Recommendations: Oral care BID    Recommendations for follow up therapy are one component of a multi-disciplinary discharge planning process, led by the attending physician.  Recommendations may be updated based on patient status, additional functional criteria and insurance authorization.  Follow up Recommendations Home health SLP      Assistance Recommended at Discharge    Functional Status Assessment Patient has had a recent decline in their functional status and demonstrates the ability to make significant improvements in function in a reasonable and predictable amount of time.  Frequency and Duration min 2x/week  2 weeks       Prognosis Prognosis for improved oropharyngeal function: Good      Swallow Study   General HPI: 78 yo presenting after GLF. Admitted with SDH and mandible fx of alveolar ridge and teeth. Previous SLP documentation suggests that she is on a mechanical soft diet at baseline. MBS was done in 05/2023 due to concern for PNA, throat clearing and wet vocal quality with thin liquids, and hoarse vocal quality. This study showed no penetration or aspiration but there was some pharyngeal residue as well as concern for more of a primary esophageal component. PMH  includes: meningioma (s/p craniotomy ), seizures, GERD, HH, aspiration PNA, breast ca (s/p chemo, XRT, mastectomy), CHF, TB with residual lung scarring Type of Study: Bedside Swallow Evaluation Previous Swallow Assessment: see HPI Diet Prior to this Study: Thin liquids (Level 0) (SOFT) Temperature Spikes Noted: No Respiratory Status: Room air History of Recent Intubation: No Behavior/Cognition: Alert;Cooperative;Pleasant mood Oral Cavity Assessment: Within Functional Limits Oral Care Completed by SLP: No Vision: Functional for self-feeding Self-Feeding Abilities: Able to feed self Patient Positioning: Upright in bed Baseline Vocal Quality: Normal Volitional Cough: Weak Volitional Swallow: Able to elicit     Oral/Motor/Sensory Function Overall Oral Motor/Sensory Function: Generalized oral weakness (guarding on L side)   Ice Chips Ice chips: Not tested   Thin Liquid Thin Liquid: Within functional limits Presentation: Self Fed;Straw    Nectar Thick Nectar Thick Liquid: Not tested   Honey Thick Honey Thick Liquid: Not tested   Puree Puree: Not tested   Solid     Solid: Not tested      Beth Brooke., M.A. CCC-SLP Acute Rehabilitation Services Office: [phone]  Secure chat preferred  01/15/2024,4:15 PM

## 2024-01-15 NOTE — TOC Initial Note (Signed)
 Transition of Care [facility]) - Initial/Assessment Note    Patient Details  Name: Tara [name] MRN: [id number] Date of Birth: 07-10-1946  [facility] CM/SW Contact:    Benjiman Bras, RN Phone Number: [phone] 01/15/2024, 12:10 PM  Clinical Narrative:                 TOC CM spoke to pt and states she lives at home with husband. She recently completed HH and outpt PT was independent at home. Had [facility] for [facility]. She has wheelchair, rw and bedside commode. States she was happy her mobility was getting better. Explained waiting PT/OT recommendations for home. She is agreeable to [facility] again for [facility]. Contacted [employer] rep, Randel Buss to make aware.   Will continue to follow for dc needs.  Expected Discharge Plan: Home w Home Health Services Barriers to Discharge: Continued Medical Work up   Patient Goals and CMS Choice Patient states their goals for this hospitalization and ongoing recovery are:: wants to get back to her baseline CMS Medicare.gov Compare Post Acute Care list provided to:: Patient Choice offered to / list presented to : Patient      Expected Discharge Plan and Services   Discharge Planning Services: CM Consult Post Acute Care Choice: Home Health                             HH Arranged: RN, PT, OT [facility] Agency: [facility] Health Care Date [facility] Agency Contacted: 01/15/24 Time HH Agency Contacted: [id number] Representative spoke with at [facility] Agency: Randel Buss  Prior Living Arrangements/Services   Lives with:: Spouse Patient language and need for interpreter reviewed:: Yes Do you feel safe going back to the place where you live?: Yes      Need for Family Participation in Patient Care: Yes (Comment) Care giver support system in place?: Yes (comment) Current home services: DME (rolling walker, wheelchair, bedside commode) Criminal Activity/Legal Involvement Pertinent to Current Situation/Hospitalization: No - Comment as needed  Activities of Daily Living   ADL  Screening (condition at time of admission) Independently performs ADLs?: No Does the patient have a NEW difficulty with bathing/dressing/toileting/self-feeding that is expected to last >3 days?: No Does the patient have a NEW difficulty with getting in/out of bed, walking, or climbing stairs that is expected to last >3 days?: No Does the patient have a NEW difficulty with communication that is expected to last >3 days?: No Is the patient deaf or have difficulty hearing?: No Does the patient have difficulty seeing, even when wearing glasses/contacts?: No Does the patient have difficulty concentrating, remembering, or making decisions?: No  Permission Sought/Granted Permission sought to share information with : Case Manager, Family Supports, PCP Permission granted to share information with : Yes, Verbal Permission Granted  Share Information with NAME: Tara [name]  Permission granted to share info w AGENCY: Home Health. DME  Permission granted to share info w Relationship: husband  Permission granted to share info w Contact Information: [phone]  Emotional Assessment Appearance:: Appears younger than stated age Attitude/Demeanor/Rapport: Engaged Affect (typically observed): Accepting Orientation: : Oriented to Self, Oriented to Place, Oriented to  Time, Oriented to Situation   Psych Involvement: No (comment)  Admission diagnosis:  Subdural hematoma (HCC) [S06.5XAA] Fall [W19.XXXA] Acute cystitis without hematuria [N30.00] Closed fracture of left side of mandibular alveolar process, initial encounter [facility]) [S02.672A] Patient Active Problem List   Diagnosis Date Noted   Fall 01/14/2024  Pain and swelling of left lower leg 08/12/2023   Acute pain of left knee 08/12/2023   Acute foot pain, left 08/12/2023   Elevated liver enzymes 08/02/2023   Encounter for immunization 08/02/2023   Encounter for Medicare annual examination with abnormal findings 08/02/2023   Community acquired  pneumonia of left lower lobe of lung 06/19/2023   Dysuria 06/03/2023   Impaired mobility and ADLs 01/05/2023   IGT (impaired glucose tolerance) 01/05/2023   Non-ischemic cardiomyopathy (HCC) 01/05/2023   Constipation 10/26/2022   Paroxysmal atrial fibrillation (HCC) 09/20/2022   Chronic anticoagulation 09/20/2022   Hiatal hernia 09/20/2022   Aspiration pneumonia (HCC) 08/14/2022   Seizure disorder (HCC)    Lethargy 05/15/2022   Dysfunction of right cardiac ventricle 05/07/2022   Acute postoperative anemia due to expected blood loss 05/07/2022   Dysphagia 05/07/2022   Palliative care encounter    Acute respiratory failure with hypoxia (HCC)    Aspiration into airway    Pressure injury of skin 04/07/2022   Meningioma (HCC) 03/24/2022   Seizure (HCC) 03/15/2022   Atrial fibrillation, chronic (HCC) 03/15/2022   Protein-calorie malnutrition, moderate (HCC) 03/15/2022   Atrial fibrillation with RVR (HCC)    Metabolic acidosis 02/27/2022   Anxiety and depression 02/27/2022   Meningioma, cerebral (HCC) 02/27/2022   Normocytic anemia 02/27/2022   Acute renal failure (ARF) (HCC) 02/27/2022   Hemorrhoids 01/04/2021   Ankle deformity, right 09/12/2020   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   Port-A-Cath in place 09/28/2018   Colon adenomas 08/06/2018   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 (HCC) 06/09/2018   Hypertension 01/25/2018   AKI (acute kidney injury) (HCC) 10/25/2016   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   Acute on chronic combined systolic and diastolic CHF (congestive heart failure) (HCC)    Demand ischemia (HCC)    Hypokalemia 09/29/2016   Prolonged Q-T interval on ECG 09/29/2016   Chronic kidney disease (CKD), stage III (moderate) (HCC) 09/29/2016   Leukocytosis 09/18/2016   Abnormal CT scan, chest    Upper airway cough syndrome  vs Cough variant asthma  04/09/2016   Chronic cough 03/17/2016   Seasonal allergies 08/08/2014   Family  history of colon cancer 03/30/2014   Osteoporosis 02/14/2014   Metabolic syndrome X 02/05/2014   SVT (supraventricular tachycardia) (HCC) 03/14/2013   Chronic HFrEF (heart failure with reduced ejection fraction) (HCC) 03/14/2013   Vitamin D  deficiency 07/23/2010   Malignant neoplasm of right breast (HCC) 12/13/2007   Hyperlipidemia 12/13/2007   Anxiety state 12/13/2007   Depression, major, single episode, 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   GERD 12/13/2007   PCP:  Towanda Fret, MD Pharmacy:   [facility] - [place], [place] - [address] [place] [address] Phone: [phone] Fax: [phone]  [facility] [address] [place] [address] Phone: [phone] Fax: [phone]     Social Drivers of Health (SDOH) Social History: SDOH Screenings   Food Insecurity: No Food Insecurity (01/14/2024)  Housing: Low Risk  (01/14/2024)  Transportation Needs: No Transportation Needs (01/14/2024)  Utilities: Not At Risk (01/14/2024)  Alcohol Screen: Low Risk  (05/01/2021)  Depression (PHQ2-9): Medium Risk (10/29/2023)  Financial Resource Strain: Low Risk  (07/07/2022)   Received from [facility]  Physical Activity: Insufficiently Active (05/01/2021)  Social Connections: Moderately Integrated (01/14/2024)  Stress: No Stress Concern Present (07/07/2022)   Received from [facility]  Tobacco Use: Low Risk  (  01/14/2024)   SDOH Interventions:     Readmission Risk Interventions    06/22/2023    1:15 PM 03/06/2022   12:36 PM  Readmission Risk Prevention Plan  Transportation Screening Complete Complete  PCP or Specialist Appt within 5-7 Days Complete   Home Care Screening Complete   Medication Review (RN CM) Complete   HRI or Home Care Consult  Complete  Social Work Consult for Recovery Care Planning/Counseling  Complete

## 2024-01-15 NOTE — Evaluation (Signed)
 Occupational Therapy Evaluation Patient Details Name: Tara [name] MRN: [id number] DOB: 03/27/1946 Today's Date: 01/15/2024   History of Present Illness   78 y/o female admitted 01/14/24 after fall at home with chin laceration and madibular fx with teeth displacement and small frontotemporal SDH.  PMH positive for DVT 2018, breast cancer, GERD, HLD, CHFw/ EF 40-45%, a fib on Eliquis , meningioma (s/p resection 2024), h/o aspiration PNA, and CKD 3.     Clinical Impressions Patient seen for limited bed level eval per pt due to nausea and recent vomiting. At baseline, pt is Independent with ADLs and reports she recently "graduated" to performing functional mobility without an AD after completing cardiac rehab. Pt reports three recent falls, including fall leading to this admission. Pt now presents with decreased activity tolerance and decreased safety and independence with functional tasks with pt requiring up to Min assist with UB ADLs from bed level. OT plans to further assess pt functional level with LB ADLs and functional mobility/transfers during next session. Pt will benefit from acute skilled OT services to address deficits outlined below and increase safety and independence with functional tasks. Currently recommending HH OT post acute discharge to maximize rehab potential, decrease risk of falls, and decrease risk of rehospitalization.      If plan is discharge home, recommend the following:   A lot of help with walking and/or transfers;A lot of help with bathing/dressing/bathroom;Assistance with cooking/housework;Assist for transportation;Help with stairs or ramp for entrance     Functional Status Assessment   Patient has had a recent decline in their functional status and demonstrates the ability to make significant improvements in function in a reasonable and predictable amount of time.     Equipment Recommendations   None recommended by OT     Recommendations for Other  Services         Precautions/Restrictions   Precautions Precautions: Fall Restrictions Weight Bearing Restrictions Per Provider Order: No     Mobility Bed Mobility               General bed mobility comments: patient declined due to nausea & vomiting (RN just medicated)    Transfers                          Balance                                           ADL either performed or assessed with clinical judgement   ADL Overall ADL's : Needs assistance/impaired Eating/Feeding: Set up;Bed level (with HOB elevated)               Upper Body Dressing : Contact guard assist;Minimal assistance;Bed level (with HOB elevated; assist with lines)                     General ADL Comments: Pt largely declining ADLs (including simulated) due to nausea and vomiting. RN provided medication just before session. OT to assess further during future OT sessions.     Vision Baseline Vision/History: 1 Wears glasses (readers; hx of cataracts with sx) Ability to See in Adequate Light: 0 Adequate Patient Visual Report: No change from baseline       Perception         Praxis         Pertinent Vitals/Pain Pain Assessment Pain Assessment:  Faces Faces Pain Scale: Hurts a little bit Pain Location: jaw Pain Descriptors / Indicators: Sore Pain Intervention(s): Monitored during session     Extremity/Trunk Assessment Upper Extremity Assessment Upper Extremity Assessment: Right hand dominant;Overall WFL for tasks assessed;RUE deficits/detail;LUE deficits/detail RUE Deficits / Details: bruises noted to R UE LUE Deficits / Details: bruises noted on L UE   Lower Extremity Assessment Lower Extremity Assessment: Defer to PT evaluation RLE Deficits / Details: AROM WFL, strength at least 4/5 throughout; bruising on knees R more than L RLE Sensation: WNL LLE Deficits / Details: AROM WFL, strength at least 4/5 throughout; bruising on knees R more  than L LLE Sensation: WNL   Cervical / Trunk Assessment Cervical / Trunk Assessment: Other exceptions Cervical / Trunk Exceptions: chin sutures present, bruising and edema along L side of lower jaw and spreading into neck on L side   Communication Communication Communication: No apparent difficulties   Cognition Arousal: Alert Behavior During Therapy: WFL for tasks assessed/performed Cognition: No apparent impairments             OT - Cognition Comments: Pt AAOx4 and pleasant throughout session. Pt cognition appears [facility] for tasks assessed. Cognition not formally screened or assessed.                 Following commands: Intact       Cueing  General Comments      VSS on RA throughout session   Exercises     Shoulder Instructions      Home Living Family/patient expects to be discharged to:: Private residence Living Arrangements: Spouse/significant other Available Help at Discharge: Family;Available 24 hours/day (husband; son and daughter also available to assist) Type of Home: House Home Access: Ramped entrance     Home Layout: Two level;Able to live on main level with bedroom/bathroom Alternate Level Stairs-Number of Steps: flight   Bathroom Shower/Tub: Producer, television/film/video: Handicapped height     Home Equipment: Shower seat - built in;Grab bars - tub/shower;Grab bars - toilet;Hand held shower head;Hospital bed;BSC/3in1;Rolling Environmental consultant (2 wheels);Cane - single point          Prior Functioning/Environment Prior Level of Function : Independent/Modified Independent             Mobility Comments: Ambulates without an AD. Reports 3 recent falls ADLs Comments: Independent with ADLs    OT Problem List:     OT Treatment/Interventions: Self-care/ADL training;Energy conservation;DME and/or AE instruction;Therapeutic activities;Patient/family education;Balance training      OT Goals(Current goals can be found in the care plan section)    Acute Rehab OT Goals Patient Stated Goal: to feel better and return home OT Goal Formulation: With patient Time For Goal Achievement: 01/29/24 Potential to Achieve Goals: Good ADL Goals Pt Will Perform Upper Body Bathing: with supervision;sitting Pt Will Perform Lower Body Bathing: with supervision;sitting/lateral leans;sit to/from stand Pt Will Perform Lower Body Dressing: with supervision;sitting/lateral leans;sit to/from stand Pt Will Transfer to Toilet: with supervision;ambulating;bedside commode (with least restrictive AD) Pt Will Perform Toileting - Clothing Manipulation and hygiene: with supervision;sitting/lateral leans;sit to/from stand Additional ADL Goal #1: Patient will demonstrate ability to Independently state 3 fall prevention strategies for increased safety and independence during functional tasks.   OT Frequency:  Min 2X/week    Co-evaluation PT/OT/SLP Co-Evaluation/Treatment: Yes Reason for Co-Treatment: Other (comment) (pt with decreased activity tolerance) PT goals addressed during session: Strengthening/ROM OT goals addressed during session: Strengthening/ROM;ADL's and self-care      AM-PAC OT "6 Clicks"  Daily Activity     Outcome Measure Help from another person eating meals?: A Little Help from another person taking care of personal grooming?: A Lot Help from another person toileting, which includes using toliet, bedpan, or urinal?: A Lot Help from another person bathing (including washing, rinsing, drying)?: A Lot Help from another person to put on and taking off regular upper body clothing?: A Little Help from another person to put on and taking off regular lower body clothing?: A Lot 6 Click Score: 14   End of Session    Activity Tolerance: Treatment limited secondary to medical complications (Comment) (limited by nausea and vomiting) Patient left:    OT Visit Diagnosis: Other (comment);History of falling (Z91.81);Repeated falls (R29.6) (decreased  activity tolerance)                Time: 1478-2956 OT Time Calculation (min): 11 min Charges:  OT General Charges $OT Visit: 1 Visit OT Evaluation $OT Eval Moderate Complexity: 1 Mod  [address] LaneM[address]., OTR/L, MA Acute Rehab [phone]   Walt Gunner 01/15/2024, 2:08 PM

## 2024-01-15 NOTE — Progress Notes (Addendum)
 Case d/w OMFS at [facility], okay for o/p f/u with general dentist.  Anda Bamberg, MD General and Trauma Surgery [facility]

## 2024-01-15 NOTE — Progress Notes (Signed)
 PT Cancellation Note  Patient Details Name: Tara [name] MRN: [id number] DOB: October 19, 1945   Cancelled Treatment:    Reason Eval/Treat Not Completed: Medical issues which prohibited therapy; patient with low HR, vomiting and med change this am.  RN requests PT to see later.  Will attempt again as able.    Marley Simmers 01/15/2024, 11:02 AM Abigail Hoff, PT Acute Rehabilitation Services Office:[phone] 01/15/2024

## 2024-01-15 NOTE — Progress Notes (Signed)
 SLP Cancellation Note  Patient Details Name: BARBRA MINER MRN: [id number] DOB: 29-Oct-1945   Cancelled treatment:       Reason Eval/Treat Not Completed: Medical issues which prohibited therapy. Attempted to see pt for cognitive and swallowing evaluations this morning but pt is actively vomiting. Discussed with RN and will f/u at another time.     Beth Brooke., M.A. CCC-SLP Acute Rehabilitation Services Office: [phone]  Secure chat preferred  01/15/2024, 10:13 AM

## 2024-01-15 NOTE — Progress Notes (Addendum)
 Trauma/Critical Care Follow Up Note  Subjective:    Overnight Issues:   Objective:  Vital signs for last 24 hours: Temp:  [96.4 F (35.8 C)-98.4 F (36.9 C)] 98 F (36.7 C) (04/25 0421) Pulse Rate:  [35-71] 54 (04/25 0645) Resp:  [9-29] 14 (04/25 0645) BP: (76-146)/(41-111) 100/48 (04/25 0645) SpO2:  [87 %-99 %] 97 % (04/25 0645) Weight:  [64 kg] 64 kg (04/24 1109)  Hemodynamic parameters for last 24 hours:    Intake/Output from previous day: 04/24 0701 - 04/25 0700 In: 30 [P.O.:30] Out: 300 [Urine:300]  Intake/Output this shift: No intake/output data recorded.  Vent settings for last 24 hours:    Physical Exam:  Gen: comfortable, no distress Neuro: follows commands, alert, communicative HEENT: PERRL Neck: supple CV: bradycardic Pulm: unlabored breathing on [place] Abd: soft, NT   , no recent BM GU: urine clear and yellow, +spontaneous voids Extr: wwp, no edema  Results for orders placed or performed during the hospital encounter of 01/14/24 (from the past 24 hours)  Comprehensive metabolic panel     Status: Abnormal   Collection Time: 01/14/24 11:51 AM  Result Value Ref Range   Sodium 139 135 - 145 mmol/L   Potassium 4.5 3.5 - 5.1 mmol/L   Chloride 108 98 - 111 mmol/L   CO2 21 (L) 22 - 32 mmol/L   Glucose, Bld 112 (H) 70 - 99 mg/dL   BUN 34 (H) 8 - 23 mg/dL   Creatinine, Ser 1.61 (H) 0.44 - 1.00 mg/dL   Calcium  8.9 8.9 - 10.3 mg/dL   Total Protein 7.0 6.5 - 8.1 g/dL   Albumin  3.0 (L) 3.5 - 5.0 g/dL   AST 096 (H) 15 - 41 U/L   ALT 248 (H) 0 - 44 U/L   Alkaline Phosphatase 104 38 - 126 U/L   Total Bilirubin 0.3 0.0 - 1.2 mg/dL   GFR, Estimated 31 (L) >60 mL/min   Anion gap 10 5 - 15  CBC with Differential     Status: Abnormal   Collection Time: 01/14/24 11:51 AM  Result Value Ref Range   WBC 8.4 4.0 - 10.5 K/uL   RBC 4.64 3.87 - 5.11 MIL/uL   Hemoglobin 12.4 12.0 - 15.0 g/dL   HCT 04.5 40.9 - 81.1 %   MCV 84.1 80.0 - 100.0 fL   MCH 26.7 26.0 - 34.0  pg   MCHC 31.8 30.0 - 36.0 g/dL   RDW 91.4 (H) 78.2 - 95.6 %   Platelets 199 150 - 400 K/uL   nRBC 0.0 0.0 - 0.2 %   Neutrophils Relative % 74 %   Neutro Abs 6.2 1.7 - 7.7 K/uL   Lymphocytes Relative 16 %   Lymphs Abs 1.4 0.7 - 4.0 K/uL   Monocytes Relative 9 %   Monocytes Absolute 0.8 0.1 - 1.0 K/uL   Eosinophils Relative 1 %   Eosinophils Absolute 0.1 0.0 - 0.5 K/uL   Basophils Relative 0 %   Basophils Absolute 0.0 0.0 - 0.1 K/uL   Immature Granulocytes 0 %   Abs Immature Granulocytes 0.03 0.00 - 0.07 K/uL  Urinalysis, Routine w reflex microscopic -Urine, Clean Catch     Status: Abnormal   Collection Time: 01/14/24 11:57 AM  Result Value Ref Range   Color, Urine YELLOW YELLOW   APPearance HAZY (A) CLEAR   Specific Gravity, Urine 1.021 1.005 - 1.030   pH 5.0 5.0 - 8.0   Glucose, UA >=500 (A) NEGATIVE mg/dL  Hgb urine dipstick NEGATIVE NEGATIVE   Bilirubin Urine NEGATIVE NEGATIVE   Ketones, ur NEGATIVE NEGATIVE mg/dL   Protein, ur NEGATIVE NEGATIVE mg/dL   Nitrite POSITIVE (A) NEGATIVE   Leukocytes,Ua NEGATIVE NEGATIVE   RBC / HPF 0-5 0 - 5 RBC/hpf   WBC, UA 11-20 0 - 5 WBC/hpf   Bacteria, UA MANY (A) NONE SEEN   Squamous Epithelial / HPF 6-10 0 - 5 /HPF  MRSA Next Gen by PCR, Nasal     Status: None   Collection Time: 01/14/24  8:40 PM   Specimen: Nasal Mucosa; Nasal Swab  Result Value Ref Range   MRSA by PCR Next Gen NOT DETECTED NOT DETECTED   *Note: Due to a large number of results and/or encounters for the requested time period, some results have not been displayed. A complete set of results can be found in Results Review.    Assessment & Plan: The plan of care was discussed with the bedside nurse for the day, who is in agreement with this plan and no additional concerns were raised.   Present on Admission:  Fall    LOS: 1 day   Additional comments:I reviewed the patient's new clinical lab test results.   and I reviewed the patients new imaging test results.     GLF  SDH - NSGY c/s, Dr. Larrie Po, s/p Kcentra, repeat CT stable. H/o meningioma removed last year with a history of seizures on keppra  BID at baseline, continue.  TBI therapies Mandible fx of aveolar ridge and teeth - will need outpatient dentistry follow up for management of this. Consult placed to [facility] L sided edema and ecchymosis of neck secondary to above - monitor.  No issues with phonation or trouble swallowing.  No intervention needed, but monitor neck exam A fib on eliquis  - hold Eliquis . H/o two falls recently, fall this admission and on 4/20.  CHF, EF 40-45% - HF team to see patient as she states she has had increase in weakness and a couple of falls since an adjustment in her meds a couple of weeks ago.  She isn't sure which meds were adjusted.  Hold on some of her home meds until seen by them.  I did restart her Farxiga  (this wasn't adjusted it appears) CKD 3 - gentle fluid hydration with NS 50cc/hr GERD- BID protonix , home dose History of meningioma/seizures - s/p resection, continue BID Keppra  HLD H/O breast cancer Elevated LFTs - ?amio related per Dr. Charline Contras last note.  These are quite increased in the last 5 months. Resume amio Prediabetes - hgbA1C in 2024 6.2 FEN - adv to soft diet, d/c IVFs, escalate bowel regimen VTE - hold Eliquis , SCDs ID - none needed Dispo - 2C if okay with HF team  Anda Bamberg, MD Trauma & General Surgery Please use [email or web address] to contact on call provider  01/15/2024  *Care during the described time interval was provided by me. I have reviewed this patient's available data, including medical history, events of note, physical examination and test results as part of my evaluation.

## 2024-01-15 NOTE — Evaluation (Signed)
 Physical Therapy Evaluation Patient Details Name: Tara [name] MRN: [id number] DOB: 1946-05-13 Today's Date: 01/15/2024  History of Present Illness  78 y/o female admitted 01/14/24 after fall at home with chin laceration and madibular fx with teeth displacement and small frontotemporal SDH.  PMH positive for DVT 2018, breast cancer, GERD, HLD, CHFw/ EF 40-45%, a fib on Eliquis , meningioma (s/p resection 2024), h/o aspiration PNA, and CKD 3.  Clinical Impression  Patient seen for limited bed level eval per pt due to nausea and recent vomiting.  She has recently "graduated" from the walker and [facility] and states was walking independently and performing ADL's on her own.  She has supportive family who can help at home.  Note strength and ROM and WFL and pain is very low.  Hopeful once she can mobilize she will progress to be able to return home with HHPT versus outpatient follow up for balance/fall prevention.  PT will follow up.         If plan is discharge home, recommend the following: A little help with walking and/or transfers;A little help with bathing/dressing/bathroom;Help with stairs or ramp for entrance;Assist for transportation;Assistance with cooking/housework   Can travel by private vehicle        Equipment Recommendations None recommended by PT  Recommendations for Other Services       Functional Status Assessment Patient has had a recent decline in their functional status and demonstrates the ability to make significant improvements in function in a reasonable and predictable amount of time.     Precautions / Restrictions Precautions Precautions: Fall      Mobility  Bed Mobility               General bed mobility comments: patient declined due to nausea & vomiting (RN just medicated)    Transfers                        Ambulation/Gait                  Stairs            Wheelchair Mobility     Tilt Bed    Modified Rankin (Stroke  Patients Only)       Balance                                             Pertinent Vitals/Pain Pain Assessment Pain Assessment: Faces Faces Pain Scale: Hurts a little bit Pain Location: jaw Pain Descriptors / Indicators: Sore Pain Intervention(s): Monitored during session    Home Living Family/patient expects to be discharged to:: Private residence Living Arrangements: Spouse/significant other Available Help at Discharge: Family;Available 24 hours/day (spouse, also son and daughter can assist) Type of Home: House Home Access: Ramped entrance       Home Layout: Two level;Able to live on main level with bedroom/bathroom Home Equipment: Shower seat - built in;Grab bars - tub/shower;Grab bars - toilet;Hand held shower head;Hospital bed;BSC/3in1;Rolling Walker (2 wheels);Cane - single point      Prior Function               Mobility Comments: Ambulates without an AD. Reports 3 recent falls ADLs Comments: Independent with ADLs     Extremity/Trunk Assessment   Upper Extremity Assessment Upper Extremity Assessment: Defer to OT evaluation RUE Deficits / Details: bruises noted to  R UE LUE Deficits / Details: (P) bruises noted on L UE    Lower Extremity Assessment Lower Extremity Assessment: RLE deficits/detail;LLE deficits/detail RLE Deficits / Details: AROM WFL, strength at least 4/5 throughout; bruising on knees R more than L RLE Sensation: WNL LLE Deficits / Details: AROM WFL, strength at least 4/5 throughout; bruising on knees R more than L LLE Sensation: WNL    Cervical / Trunk Assessment Cervical / Trunk Assessment: Other exceptions Cervical / Trunk Exceptions: chin sutures present, bruising and edema along L side of lower jaw and spreading into neck on L side  Communication   Communication Communication: No apparent difficulties    Cognition Arousal: Alert Behavior During Therapy: WFL for tasks assessed/performed   PT - Cognitive  impairments: No apparent impairments                         Following commands: Intact       Cueing       General Comments General comments (skin integrity, edema, etc.): VSS on RA    Exercises     Assessment/Plan    PT Assessment Patient needs continued PT services  PT Problem List Decreased mobility;Decreased activity tolerance;Decreased balance       PT Treatment Interventions DME instruction;Therapeutic exercise;Gait training;Balance training;Functional mobility training;Therapeutic activities;Patient/family education    PT Goals (Current goals can be found in the Care Plan section)  Acute Rehab PT Goals Patient Stated Goal: feel better, go home PT Goal Formulation: With patient Time For Goal Achievement: 01/28/24 Potential to Achieve Goals: Good    Frequency Min 3X/week     Co-evaluation PT/OT/SLP Co-Evaluation/Treatment: Yes Reason for Co-Treatment: Other (comment) (activity tolerance) PT goals addressed during session: Strengthening/ROM         AM-PAC PT "6 Clicks" Mobility  Outcome Measure Help needed turning from your back to your side while in a flat bed without using bedrails?: Total Help needed moving from lying on your back to sitting on the side of a flat bed without using bedrails?: Total Help needed moving to and from a bed to a chair (including a wheelchair)?: Total Help needed standing up from a chair using your arms (e.g., wheelchair or bedside chair)?: Total Help needed to walk in hospital room?: Total Help needed climbing 3-5 steps with a railing? : Total 6 Click Score: 6    End of Session   Activity Tolerance: Treatment limited secondary to medical complications (Comment) (N&V) Patient left: in bed;with call bell/phone within reach   PT Visit Diagnosis: History of falling (Z91.81);Other abnormalities of gait and mobility (R26.89)    Time: 1217-1228 PT Time Calculation (min) (ACUTE ONLY): 11 min   Charges:   PT  Evaluation $PT Eval Moderate Complexity: 1 Mod   PT General Charges $$ ACUTE PT VISIT: 1 Visit         Abigail Hoff, PT Acute Rehabilitation Services Office:[phone] 01/15/2024   Tara [name] 01/15/2024, 12:47 PM

## 2024-01-15 NOTE — Consult Note (Addendum)
 Cardiology Consultation   Patient ID: Tara [name] MRN: [id number]; DOB: October 10, 1945  Admit date: 01/14/2024 Date of Consult: 01/15/2024  PCP:  Towanda Fret, MD   [facility] Providers Cardiologist:  None        Patient Profile:   Tara [name] is a 78 y.o. female with a hx of  GERD, HLD, seizure d/o SVT (described as short RP > remotely Breast cancer: 2008 and had lumpectomy, radiation, and chemotherapy which included epirubicin, and anthracycline.                           Recurrent cancer in 2019 with right mastectomy and chemo with cyclophosphamide , MTX, and fluorouracil . DVT (in setting of a PNA treated for 6 mo) 2019 CKD (III) Meningioma resected 2023 NICM AFib  who is being seen 01/15/2024 for the evaluation of syncope and bradycardia at the request of Dr. Alease Amend.  History of Present Illness:   [name] fall 2024 back in atrial fibrillation and mildly volume overloaded. Lasix  increased to 40 daily, Farxiga  started and arranged for repeat echo and DCCV.  Echo 11/24 showed EF 30-35%, RV mildly reduced. DCCV 08/06/23 to NSR   Echo was done 12/15/23 at her visit with Dr. Mitzie Anda, reviewed by him with, EF is in the 40% range with normal RV, IVC normal.  She was doing well, participating in c. Rehab Subsequently started on low dose statin after labs done Another provider apparently also recommended and she started an OTC sleep aid  Since then she reported feeling weak, dizzy and had a couple falls  She was admitted yesterday 01/14/24, after a fall, reports that her legs gave way and reorts a fall > striking her chin She was brought in, bleeding controlled (laceration) Found with frontotemporal SDH and nondisplaced fracture of L mandible  Weakness/falls initially suspect perhaps due to statin myalgia, sleep aid perhaps polypharmacy SCr 1.67, Na 139, K 4.5, AST/ALT 237/248, Hgb 12.4. +UTI.   Trauma team admitted, neurosurgery and CHF teams consulting Her  home Eliquis  held with plans to have survellience head CT >> done this morning and stable with plans for out patient neurosurgery f/u and to hold OAC for a few days  No surgical intervention for her mandibular.dental fractures > rec for outpatient dentistry > needs oral surgery for the alveolar ridge mandible fracture she has with the teeth involved. This needs to be soon   EP is brought on board today with the development of SB to 30's with hypotension > started on dopamine , home amiodarone  held  LABS K+ 4.5 > 4.2 BUN/Creat 34/1.67 >> 1.44 (baseline Creat looks worse > 1.7-1.9) AST 237 ALT 248 CK 213 WBC 8.4 H/H 11.5/36 Plts 178  Home rate/rhythm/nodal blockers: Amiodarone  (chronically), Rx 200mg  daily > reportedly taking 100mg  daily  Patient reports that she had been to the bathroom and sat back down for some time, was going to have to go to her  husband's appointment and though she should go to the bathroom again before leaving Upon standing she said she either tripped or her feet slipped from her, she did not feel dizzy  But did fall She is certain that she did not faint or feel like fainting   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   Angio-edema    Anxiety    Breast cancer (HCC) 2008   right - s/p  lumpectomy->chemo, radiation   Depression    Dysrhythmia    hx SVT   Family history of colon cancer    Family history of prostate cancer    GERD (gastroesophageal reflux disease)    H/O: hysterectomy    Hiatal hernia    History of cancer chemotherapy    History of radiation therapy    Hyperlipidemia    Hypertension 01/25/2018   Nonischemic cardiomyopathy (HCC)    Personal history of radiation therapy 01/05/209   SVT (supraventricular tachycardia) (HCC)    short RP SVT documented 5/14   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HCC)    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)   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    Past Surgical History:  Procedure Laterality Date   ABDOMINAL HYSTERECTOMY  1994   fibroids,    BIOPSY  06/19/2016   Procedure: BIOPSY;  Surgeon: Suzette Espy, MD;  [facility]: [facility];  Service: Endoscopy;;  gastric duodenum   BREAST BIOPSY Right 12/16/2006   malignant   BREAST EXCISIONAL BIOPSY Right 2018   benign lumpectomy   BREAST LUMPECTOMY Right 02/2007   BREAST LUMPECTOMY WITH RADIOACTIVE SEED LOCALIZATION Right 07/29/2017   Procedure: RIGHT BREAST LUMPECTOMY WITH RADIOACTIVE SEED LOCALIZATION;  Surgeon: Boyce Byes, MD;  Location: [facility] OR;  Service: General;  Laterality: Right;   BREAST SURGERY Right 2008   lumpectomy, cancer   CARDIAC CATHETERIZATION     CARDIOVERSION N/A 08/06/2023   Procedure: CARDIOVERSION (CATH LAB);  Surgeon: Darlis Eisenmenger, MD;  Location: [facility] INVASIVE CV LAB;  Service: Cardiovascular;  Laterality: N/A;   CHOLECYSTECTOMY  1999   COLONOSCOPY  2008   Dr. Nolene Baumgarten: multiple polyps. Path not available at time of visit.    COLONOSCOPY WITH PROPOFOL  N/A 04/25/2014   Dr. Nolene Baumgarten: Multiple tubular adenomas removed. Diverticulosis. Moderate internal hemorrhoids. Next colonoscopy planned for August 2018.   CRANIOTOMY Bilateral 03/24/2022   Procedure: Bifrontal craniotomy for resection of meningioma;  Surgeon: Audie Bleacher, MD;  Location: [facility] OR;  Service: Neurosurgery;  Laterality: Bilateral;   ESOPHAGOGASTRODUODENOSCOPY (EGD) WITH PROPOFOL  N/A 06/19/2016   Procedure: ESOPHAGOGASTRODUODENOSCOPY (EGD) WITH PROPOFOL ;  Surgeon: Suzette Espy, MD;  [facility]: [facility];  Service: Endoscopy;  Laterality: N/A;   MASTECTOMY W/ SENTINEL NODE BIOPSY Right 06/09/2018   Procedure: RIGHT TOTAL MASTECTOMY WITH SENTINEL LYMPH NODE BIOPSY;  Surgeon: Boyce Byes, MD;  Location: [facility] OR;  Service: General;  Laterality: Right;   POLYPECTOMY N/A 04/25/2014   Procedure: POLYPECTOMY;  Surgeon:  Alyce Jubilee, MD;  Location: [facility] ORS;  Service: Endoscopy;  Laterality: N/A;  Ascending and Decending Colon x3 , Transverse colon x2, rectal   PORTACATH PLACEMENT N/A 06/09/2018   Procedure: INSERTION PORT-A-CATH;  Surgeon: Boyce Byes, MD;  Location: [facility] OR;  Service: General;  Laterality: N/A;   RIGHT/LEFT HEART CATH AND CORONARY ANGIOGRAPHY N/A 07/10/2017   Procedure: RIGHT/LEFT HEART CATH AND CORONARY ANGIOGRAPHY;  Surgeon: Darlis Eisenmenger, MD;  Location: [facility] INVASIVE CV LAB;  Service: Cardiovascular;  Laterality: N/A;     Home Medications:  Prior to Admission medications   Medication Sig Start Date End Date Taking? Authorizing Provider  acetaminophen  (TYLENOL ) 500 MG tablet Take 1,000 mg by mouth every 6 (six) hours as needed for moderate pain (pain score 4-6) or mild pain (pain score 1-3).   Yes [provider]  amiodarone  (PACERONE ) 200 MG tablet Take 1 tablet (  200 mg total) by mouth daily. 09/04/23  Yes Lee, [place], NP  apixaban  (ELIQUIS ) 5 MG TABS tablet Take 1 tablet (5 mg total) by mouth 2 (two) times daily. 09/04/23  Yes Lee, [place], NP  bisacodyl  5 MG EC tablet One tablet every 3 days, if needed, for constipation Patient taking differently: Take 5 mg by mouth daily as needed for moderate constipation or mild constipation. 08/11/23  Yes Towanda Fret, MD  clonazePAM  (KLONOPIN ) 0.5 MG tablet Take 1 tablet (0.5 mg total) by mouth at bedtime. 09/01/23  Yes Towanda Fret, MD  docusate sodium  (COLACE) 100 MG capsule Take 1 capsule (100 mg total) by mouth 2 (two) times daily. 07/22/23  Yes Towanda Fret, MD  FARXIGA  10 MG TABS tablet Take 1 tablet (10 mg total) by mouth daily before breakfast. 09/04/23  Yes Lee, [place], NP  fluticasone  (FLONASE ) 50 MCG/ACT nasal spray Place 2 sprays into both nostrils daily. Patient taking differently: Place 2 sprays into both nostrils daily as needed for rhinitis or allergies. 10/07/23  Yes Rochester Chuck, MD   furosemide  (LASIX ) 20 MG tablet Take 1 tablet (20 mg total) by mouth daily. 08/07/23  Yes Milford, Jessica M, FNP  hydrocortisone  (ANUSOL -HC) 2.5 % rectal cream APPLY RECTALLY TWICE DAILY. Patient taking differently: Place 1 Application rectally 2 (two) times daily as needed for hemorrhoids or anal itching. 11/20/23  Yes Towanda Fret, MD  hydrOXYzine  (ATARAX ) 10 MG tablet Take one tablet at bedtime for itching Patient taking differently: Take 10 mg by mouth at bedtime. 12/02/23  Yes Towanda Fret, MD  lacosamide  (VIMPAT ) 50 MG TABS tablet Take 1 tablet (50 mg total) by mouth 2 (two) times daily. 07/02/23 03/28/24 Yes Cassandra Cleveland, MD  levETIRAcetam  (KEPPRA ) 500 MG tablet Take 1 tablet (500 mg total) by mouth 2 (two) times daily. 07/02/23 06/26/24 Yes Camara, Nancye Azure, MD  montelukast  (SINGULAIR ) 10 MG tablet Take 1 tablet (10 mg total) by mouth at bedtime. 10/07/23  Yes Rochester Chuck, MD  pantoprazole  (PROTONIX ) 40 MG tablet TAKE ONE TABLET BY MOUTH TWICE DAILY 12/28/23  Yes Towanda Fret, MD  polyethylene glycol (MIRALAX ) 17 g packet Take one packet mira lax once daily in 8 ounces water  Patient taking differently: Take 17 g by mouth daily as needed for moderate constipation, mild constipation or severe constipation. 07/22/23  Yes Towanda Fret, MD  potassium chloride  SA (KLOR-CON  M) 20 MEQ tablet Take 1 tablet (20 mEq total) by mouth daily. 08/07/23  Yes Milford, Arlice Bene, FNP  rosuvastatin  (CRESTOR ) 10 MG tablet Take 1 tablet (10 mg total) by mouth daily. Patient taking differently: Take 10 mg by mouth at bedtime. 12/18/23 03/17/24 Yes Darlis Eisenmenger, MD  sertraline  (ZOLOFT ) 25 MG tablet TAKE 1 TABLET BY MOUTH ONCE A DAY. 12/28/23  Yes Towanda Fret, MD  spironolactone  (ALDACTONE ) 25 MG tablet Take 0.5 tablets (12.5 mg total) by mouth daily. 12/15/23  Yes Darlis Eisenmenger, MD    Inpatient Medications: Scheduled Meds:  acetaminophen   650 mg Oral Q6H    Chlorhexidine  Gluconate Cloth  6 each Topical Daily   docusate sodium   100 mg Oral BID   feeding supplement  1 Container Oral TID BM   hydrOXYzine   10 mg Oral QHS   lacosamide   50 mg Oral BID   levETIRAcetam   500 mg Oral BID   methocarbamol   500 mg Oral Q8H   midodrine   5 mg Oral TID WC   pantoprazole   40  mg Oral BID   polyethylene glycol  17 g Oral BID   sertraline   25 mg Oral Daily   Continuous Infusions:  DOPamine  3 mcg/kg/min (01/15/24 1000)   PRN Meds: clonazePAM , mouth rinse, oxyCODONE , oxyCODONE   Allergies:    Allergies  Allergen Reactions   Aspirin  Other (See Comments)    Reports GI bleed-   Lisinopril  Cough   Sudafed [Pseudoephedrine Hcl] Other (See Comments)    Pt reports she had drainage in throat that made her throat hurt.     Social History:   Social History   Socioeconomic History   Marital status: Married    Spouse name: Not on file   Number of children: 2   Years of education: Not on file   Highest education level: Not on file  Occupational History   Occupation: disabled     Employer: UNEMPLOYED  Tobacco Use   Smoking status: Never   Smokeless tobacco: Never  Vaping Use   Vaping status: Never Used  Substance and Sexual Activity   Alcohol use: No   Drug use: No   Sexual activity: Not on file  Other Topics Concern   Not on file  Social History Narrative   Lives in [place] with family.  Does not routinely exercise. Right handed   Caffeine-none   Social Drivers of Health   Financial Resource Strain: Low Risk  (07/07/2022)   Received from [facility]   Overall Financial Resource Strain (CARDIA)    Difficulty of Paying Living Expenses: Not hard at all  Food Insecurity: No Food Insecurity (01/14/2024)   Hunger Vital Sign    Worried About Running Out of Food in the Last Year: Never true    Ran Out of Food in the Last Year: Never true  Transportation Needs: No Transportation Needs (01/14/2024)   PRAPARE - Therapist, art (Medical): No    Lack of Transportation (Non-Medical): No  Physical Activity: Insufficiently Active (05/01/2021)   Exercise Vital Sign    Days of Exercise per Week: 3 days    Minutes of Exercise per Session: 30 min  Stress: No Stress Concern Present (07/07/2022)   Received from [facility], [facility] of Occupational Health - Occupational Stress Questionnaire    Feeling of Stress : Not at all  Social Connections: Moderately Integrated (01/14/2024)   Social Connection and Isolation Panel [NHANES]    Frequency of Communication with Friends and Family: More than three times a week    Frequency of Social Gatherings with Friends and Family: More than three times a week    Attends Religious Services: More than 4 times per year    Active Member of [employer] or Organizations: No    Attends Banker Meetings: Never    Marital Status: Married  Catering manager Violence: Not At Risk (01/14/2024)   Humiliation, Afraid, Rape, and Kick questionnaire    Fear of Current or Ex-Partner: No    Emotionally Abused: No    Physically Abused: No    Sexually Abused: No    Family History:   Family History  Problem Relation Age of Onset   Dementia Mother    Stroke Mother 82       left hemiparesis   Colon cancer Father 23       died at age 48   Breast cancer Sister    Hypertension Sister    Heart disease Sister    Cancer Sister  unknown form   Cancer Paternal Aunt        NOS   Other Paternal Aunt        old age   Lung cancer Paternal Uncle    Other Paternal Uncle        lightning strike   Other Paternal Uncle        hit by train   Cancer Cousin        NOS pat first cousin   Cancer Cousin        NOS pat first cousin   Prostate cancer Cousin        pat first cousin   Cancer Brother 25       prostate     ROS:  Please see the history of present illness.  All other ROS reviewed and negative.     Physical Exam/Data:   Vitals:    01/15/24 1000 01/15/24 1005 01/15/24 1015 01/15/24 1030  BP: 100/63  (!) 144/67 (!) 114/51  Pulse: (!) 43 82 82 65  Resp: 18 18 15 20   Temp:      TempSrc:      SpO2: 90% 93% 94% 93%  Weight:      Height:        Intake/Output Summary (Last 24 hours) at 01/15/2024 1102 Last data filed at 01/15/2024 1000 Gross per 24 hour  Intake 131.07 ml  Output 300 ml  Net -168.93 ml      01/14/2024   11:09 AM 01/06/2024    3:04 PM 12/15/2023   12:09 PM  Last 3 Weights  Weight (lbs) 141 lb 141 lb 8.6 oz 144 lb  Weight (kg) 63.957 kg 64.2 kg 65.318 kg     Body mass index is 29.47 kg/m.  General:  Well nourished, well developed, in no acute distress HEENT: laceration/sutured chin/R mandible, large area ecchymosis L face/chin/neck Neck: no JVD Vascular: No carotid bruits Cardiac: RRR; no murmurs, gallops or rubs Lungs:  CTA b/l, no wheezing, rhonchi or rales  Abd: soft, nontender Ext: no edema Musculoskeletal:  No deformities Skin: warm and dry  Neuro:  no gross focal abnormalities noted Psych:  Normal affect   EKG:  The EKG was personally reviewed and demonstrates:    #1 SB 52bpm, PACs, QTc , LBBB , LAD #2 this morning SB/sinus arrhythmia, 48bpm,  #3 SB, PACs 49bpm  OLD 12/15/23 SR 60bpm, IVCD, LAD  Telemetry:  Telemetry was personally reviewed and demonstrates:   Since in 2H sinus rates 40's > 60's >> this morning rates started to come down 40's again, PACs, blocked PACs, sinus arrhythmia low 40, 39 range I dont see any clear finding of AV block > she does prolong her PR with PACs  Relevant CV Studies:  Echo 3/25: EF is in the 40% range with normal RV, IVC normal.  Echo 11/24 EF 30-35%, RV mildly reduced. Echo 7/23 EF 40-45%, D-shaped septum, moderate RV dysfunction with mild RV enlargement, IVC normal.     Echo 5/21 EF 45%, global hypokinesis, mildly decreased RV systolic function.   Echo 7/22 EF 55% with normal RV and IVC.    RHC/LHC in 10/18 showing no  significant CAD and mildly elevate PCWP. Cardiac MRI in 11/18 showed EF 49%, no LGE   Laboratory Data:  High Sensitivity Troponin:  No results for input(s): "TROPONINIHS" in the last 720 hours.   Chemistry Recent Labs  Lab 01/14/24 1151 01/15/24 0850  NA 139 137  K 4.5 4.2  CL 108 110  CO2 21* 19*  GLUCOSE 112* 135*  BUN 34* 22  CREATININE 1.67* 1.44*  CALCIUM  8.9 7.9*  GFRNONAA 31* 37*  ANIONGAP 10 8    Recent Labs  Lab 01/14/24 1151  PROT 7.0  ALBUMIN  3.0*  AST 237*  ALT 248*  ALKPHOS 104  BILITOT 0.3   Lipids No results for input(s): "CHOL", "TRIG", "HDL", "LABVLDL", "LDLCALC", "CHOLHDL" in the last 168 hours.  Hematology Recent Labs  Lab 01/14/24 1151 01/15/24 0850  WBC 8.4 8.4  RBC 4.64 4.32  HGB 12.4 11.5*  HCT 39.0 36.3  MCV 84.1 84.0  MCH 26.7 26.6  MCHC 31.8 31.7  RDW 15.8* 15.8*  PLT 199 178   Thyroid  No results for input(s): "TSH", "FREET4" in the last 168 hours.  BNPNo results for input(s): "BNP", "PROBNP" in the last 168 hours.  DDimer No results for input(s): "DDIMER" in the last 168 hours.   Radiology/Studies:    CT HEAD WO CONTRAST ( ) Result Date: 01/15/2024 CLINICAL DATA:  Follow-up subdural hematoma EXAM: CT HEAD WITHOUT CONTRAST TECHNIQUE: Contiguous axial images were obtained from the base of the skull through the vertex without intravenous contrast. RADIATION DOSE REDUCTION: This exam was performed according to the departmental dose-optimization program which includes automated exposure control, adjustment of the mA and/or kV according to patient size and/or use of iterative reconstruction technique. COMPARISON:  Yesterday FINDINGS: Brain: Thin subdural hematoma versus dural thickening (see brain MRI 10/16/2022 which showed asymmetric dural thickening in this location) along the right cerebral convexity is unchanged at up to 3 mm in thickness. No cerebral mass effect. Bifrontal encephalomalacia deep to a craniotomy site. No brain  swelling today. Small chronic right cerebellar infarction. No evidence of acute infarct. No hydrocephalus. Vascular: No hyperdense vessel or unexpected calcification. Skull: Unremarkable bifrontal craniotomy site. Sinuses/Orbits: Generalized paranasal sinus opacification with maxillary fluid. IMPRESSION: Unchanged thin subdural hematoma or dural thickening along the right cerebral convexity. Electronically Signed   By: Ronnette Coke M.D.   On: 01/15/2024 05:03   DG Knee 2 Views Right Result Date: 01/14/2024 CLINICAL DATA:  Right knee pain after fall. EXAM: RIGHT KNEE - 1-2 VIEW COMPARISON:  March 08, 2016. FINDINGS: No evidence of fracture, dislocation, or joint effusion. No evidence of arthropathy. Mild patellar spurring is noted. Soft tissues are unremarkable. IMPRESSION: No acute abnormality seen. Electronically Signed   By: Rosalene Colon M.D.   On: 01/14/2024 14:21   DG Knee 2 Views Left Result Date: 01/14/2024 CLINICAL DATA:  Left knee pain after fall. EXAM: LEFT KNEE - 1-2 VIEW COMPARISON:  None Available. FINDINGS: No evidence of fracture, dislocation, or joint effusion. No evidence of arthropathy. Minimal patellar spurring is noted. Soft tissues are unremarkable. IMPRESSION: No acute abnormality seen. Electronically Signed   By: Rosalene Colon M.D.   On: 01/14/2024 14:20   CT Head Wo Contrast Result Date: 01/14/2024 CLINICAL DATA:  Fall and head trauma. EXAM: CT HEAD WITHOUT CONTRAST CT MAXILLOFACIAL WITHOUT CONTRAST CT CERVICAL SPINE WITHOUT CONTRAST TECHNIQUE: Multidetector CT imaging of the head, cervical spine, and maxillofacial structures were performed using the standard protocol without intravenous contrast. Multiplanar CT image reconstructions of the cervical spine and maxillofacial structures were also generated. RADIATION DOSE REDUCTION: This exam was performed according to the departmental dose-optimization program which includes automated exposure control, adjustment of the mA and/or  kV according to patient size and/or use of iterative reconstruction technique. COMPARISON:  None Available. FINDINGS: CT HEAD FINDINGS Brain: There is mild age-related atrophy  and chronic microvascular ischemic changes. 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 Small right frontotemporal subdural hematoma measures approximately 4 mm in thickness. No mass effect or midline shift. Vascular: No hyperdense vessel or unexpected calcification. Skull: No acute calvarial pathology.  Frontal craniotomy. Other: None CT MAXILLOFACIAL FINDINGS Osseous: Nondisplaced fracture of the anterior left mandible involving the alveolar ridge of the left lateral incisor and canine teeth. There is fracture of the left lateral incisor tooth. Air pocket adjacent to the roots of the left central and lateral incisor and canine teeth suggestive of loosening. Right mandibular first molar tooth. Orbits: The globes and retro fat are preserved. Sinuses: Diffuse mucoperiosteal thickening and opacification of paranasal sinuses. No air-fluid level. The mastoid air cells are clear. Soft tissues: Soft tissue swelling of the lips. There is laceration of the skin over the chin. CT CERVICAL SPINE FINDINGS Alignment: No acute subluxation. Skull base and vertebrae: No acute fracture. Soft tissues and spinal canal: No prevertebral fluid or swelling. No visible canal hematoma. Disc levels:  No acute findings.  Degenerative changes. Upper chest: Negative. Other: None IMPRESSION: 1. Small right frontotemporal subdural hematoma. No mass effect or midline shift. 2. Nondisplaced fracture of the anterior left mandible involving the alveolar ridges of the lateral incisor and canine teeth. Fracture of the left lateral incisor tooth. 3. No acute/traumatic cervical spine pathology. These results were called by telephone at the time of interpretation on 01/14/2024 at 1:51 pm to Auston Blush, MD, who verbally acknowledged these results. Electronically  Signed   By: Angus Bark M.D.   On: 01/14/2024 13:54     Assessment and Plan:   SB Question of her falls of late are symptomatic bradycardia She has hx of Afib on amiodarone  since ~2023 No other nodal blocking agents noted And has been held (LFTs also up more then usual) Has had good response to dopamine  with HRs settled to high 50's-60's  SBP 110s currently (90's earlier)  No AV block is noted No syncope She would very much like to avoid pacing Stop amiodarone  > try to get her off dopamine  over the weekend Could consider tikosyn in the future for her AFib if needed  Dr. Lawana Pray has seen the patient EP Deniece Rankin follow along   Risk Assessment/Risk Scores:    For questions or updates, please contact [facility] Please consult [email or web address] for contact info under    Signed, Debbie Fails, PA-C  01/15/2024 11:02 AM  I have seen and examined this patient with Mertha Abrahams.  Agree with above, note added to reflect my findings.  Patient with a history of breast cancer, SVT, chronic systolic heart failure, atrial fibrillation.  She presented to the hospital after a fall.  She reports that her legs got weak.  She fell and struck her chin.  She was found to have a laceration, frontotemporal subdural hematoma and a nondisplaced fracture of the left mandible.  The patient states that she did not have a syncope.  She remembers the entire incident.  Since being in the hospital, she has had some bradycardia with heart rates into the upper 30s.  Her blood pressure is somewhat low during these times.  She was started on dopamine  with improvement in her heart rate and blood pressure.  She is lying in bed currently without complaint.  GEN: Chronically ill-appearing HEENT: normal  Cardiac: Regular Respiratory:  clear to auscultation bilaterally, normal work of breathing GI: soft, nontender, nondistended, + BS Skin: warm and dry Psych: euthymic  mood, full affect   Sick sinus  syndrome: Has had significant bradycardia.  She is now on dopamine .  It was thought that some of her issue with her fall was related to multiple medications.  Medications have been held.  She would prefer to avoid pacemaker implant if at all possible.  Would try to wean dopamine  through the weekend.  If she does have symptomatic bradycardia, Leesa Leifheit potentially need pacemaker implant next week.  Anairis Knick M. Gorden Stthomas MD 01/15/2024 2:29 PM

## 2024-01-15 NOTE — Progress Notes (Signed)
 Transition of Care [facility]) - CAGE-AID Screening   Patient Details  Name: Tara [name] MRN: [id number] Date of Birth: 1945-10-11  Transition of Care [facility]) CM/SW Contact:    Brunetta Capes, RN Phone Number: 01/15/2024, 12:05 AM   Clinical Narrative:  Patient denies the use of alcohol and illicit drugs. Resources not given at this time.  CAGE-AID Screening:    Have You Ever Felt You Ought to Cut Down on Your Drinking or Drug Use?: No Have People Annoyed You By Critizing Your Drinking Or Drug Use?: No Have You Felt Bad Or Guilty About Your Drinking Or Drug Use?: No Have You Ever Had a Drink or Used Drugs First Thing In The Morning to Steady Your Nerves or to Get Rid of a Hangover?: No CAGE-AID Score: 0  Substance Abuse Education Offered: No

## 2024-01-15 NOTE — Progress Notes (Signed)
 Advanced Heart Failure Rounding Note  Cardiologist: None  Chief Complaint: Mechanical fall Subjective:    Bradycardic to 40-50s overnight with hypotension SBP 80-90s/40s. AM labs pending.   Sitting up in bed, feeling better mild pain in chin.   Objective:    Weight Range: 64 kg Body mass index is 29.47 kg/m.   Vital Signs:   Temp:  [96.4 F (35.8 C)-98.4 F (36.9 C)] 98 F (36.7 C) (04/25 0421) Pulse Rate:  [35-71] 54 (04/25 0645) Resp:  [9-29] 14 (04/25 0645) BP: (76-146)/(41-111) 100/48 (04/25 0645) SpO2:  [87 %-99 %] 97 % (04/25 0645) Weight:  [64 kg] 64 kg (04/24 1109) Last BM Date :  (PTA)  Weight change: Filed Weights   01/14/24 1109  Weight: 64 kg   Intake/Output:  Intake/Output Summary (Last 24 hours) at 01/15/2024 0710 Last data filed at 01/15/2024 0430 Gross per 24 hour  Intake 30 ml  Output 300 ml  Net -270 ml    Physical Exam    General: Frail appearing.  Cardiac: JVP flat. S1 and S2 present. No murmurs or rub. Extremities: Warm and dry.  No edema.  Neuro: Alert and oriented x3. Affect pleasant.   Telemetry   SB 40-50s (personally reviewed)  EKG    No new ECG to review  Labs    CBC Recent Labs    01/14/24 1151  WBC 8.4  NEUTROABS 6.2  HGB 12.4  HCT 39.0  MCV 84.1  PLT 199   Basic Metabolic Panel Recent Labs    09/81/19 1151  NA 139  K 4.5  CL 108  CO2 21*  GLUCOSE 112*  BUN 34*  CREATININE 1.67*  CALCIUM  8.9   Liver Function Tests Recent Labs    01/14/24 1151  AST 237*  ALT 248*  ALKPHOS 104  BILITOT 0.3  PROT 7.0  ALBUMIN  3.0*   BNP: BNP (last 3 results) Recent Labs    08/07/23 1453 09/04/23 1610 12/15/23 1242  BNP 332.4* 102.1* 355.2*   Imaging    CT HEAD WO CONTRAST ( ) Result Date: 01/15/2024 CLINICAL DATA:  Follow-up subdural hematoma EXAM: CT HEAD WITHOUT CONTRAST TECHNIQUE: Contiguous axial images were obtained from the base of the skull through the vertex without intravenous contrast.  RADIATION DOSE REDUCTION: This exam was performed according to the departmental dose-optimization program which includes automated exposure control, adjustment of the mA and/or kV according to patient size and/or use of iterative reconstruction technique. COMPARISON:  Yesterday FINDINGS: Brain: Thin subdural hematoma versus dural thickening (see brain MRI 10/16/2022 which showed asymmetric dural thickening in this location) along the right cerebral convexity is unchanged at up to 3 mm in thickness. No cerebral mass effect. Bifrontal encephalomalacia deep to a craniotomy site. No brain swelling today. Small chronic right cerebellar infarction. No evidence of acute infarct. No hydrocephalus. Vascular: No hyperdense vessel or unexpected calcification. Skull: Unremarkable bifrontal craniotomy site. Sinuses/Orbits: Generalized paranasal sinus opacification with maxillary fluid. IMPRESSION: Unchanged thin subdural hematoma or dural thickening along the right cerebral convexity. Electronically Signed   By: Ronnette Coke M.D.   On: 01/15/2024 05:03   DG Knee 2 Views Right Result Date: 01/14/2024 CLINICAL DATA:  Right knee pain after fall. EXAM: RIGHT KNEE - 1-2 VIEW COMPARISON:  March 08, 2016. FINDINGS: No evidence of fracture, dislocation, or joint effusion. No evidence of arthropathy. Mild patellar spurring is noted. Soft tissues are unremarkable. IMPRESSION: No acute abnormality seen. Electronically Signed   By: Irving Mantle.D.  On: 01/14/2024 14:21   DG Knee 2 Views Left Result Date: 01/14/2024 CLINICAL DATA:  Left knee pain after fall. EXAM: LEFT KNEE - 1-2 VIEW COMPARISON:  None Available. FINDINGS: No evidence of fracture, dislocation, or joint effusion. No evidence of arthropathy. Minimal patellar spurring is noted. Soft tissues are unremarkable. IMPRESSION: No acute abnormality seen. Electronically Signed   By: Rosalene Colon M.D.   On: 01/14/2024 14:20   CT Head Wo Contrast Result Date:  01/14/2024 CLINICAL DATA:  Fall and head trauma. EXAM: CT HEAD WITHOUT CONTRAST CT MAXILLOFACIAL WITHOUT CONTRAST CT CERVICAL SPINE WITHOUT CONTRAST TECHNIQUE: Multidetector CT imaging of the head, cervical spine, and maxillofacial structures were performed using the standard protocol without intravenous contrast. Multiplanar CT image reconstructions of the cervical spine and maxillofacial structures were also generated. RADIATION DOSE REDUCTION: This exam was performed according to the departmental dose-optimization program which includes automated exposure control, adjustment of the mA and/or kV according to patient size and/or use of iterative reconstruction technique. COMPARISON:  None Available. FINDINGS: CT HEAD FINDINGS Brain: There is mild age-related atrophy and chronic microvascular ischemic changes. 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 Small right frontotemporal subdural hematoma measures approximately 4 mm in thickness. No mass effect or midline shift. Vascular: No hyperdense vessel or unexpected calcification. Skull: No acute calvarial pathology.  Frontal craniotomy. Other: None CT MAXILLOFACIAL FINDINGS Osseous: Nondisplaced fracture of the anterior left mandible involving the alveolar ridge of the left lateral incisor and canine teeth. There is fracture of the left lateral incisor tooth. Air pocket adjacent to the roots of the left central and lateral incisor and canine teeth suggestive of loosening. Right mandibular first molar tooth. Orbits: The globes and retro fat are preserved. Sinuses: Diffuse mucoperiosteal thickening and opacification of paranasal sinuses. No air-fluid level. The mastoid air cells are clear. Soft tissues: Soft tissue swelling of the lips. There is laceration of the skin over the chin. CT CERVICAL SPINE FINDINGS Alignment: No acute subluxation. Skull base and vertebrae: No acute fracture. Soft tissues and spinal canal: No prevertebral fluid or swelling. No  visible canal hematoma. Disc levels:  No acute findings.  Degenerative changes. Upper chest: Negative. Other: None IMPRESSION: 1. Small right frontotemporal subdural hematoma. No mass effect or midline shift. 2. Nondisplaced fracture of the anterior left mandible involving the alveolar ridges of the lateral incisor and canine teeth. Fracture of the left lateral incisor tooth. 3. No acute/traumatic cervical spine pathology. These results were called by telephone at the time of interpretation on 01/14/2024 at 1:51 pm to Auston Blush, MD, who verbally acknowledged these results. Electronically Signed   By: Angus Bark M.D.   On: 01/14/2024 13:54   CT Maxillofacial Wo Contrast Result Date: 01/14/2024 CLINICAL DATA:  Fall and head trauma. EXAM: CT HEAD WITHOUT CONTRAST CT MAXILLOFACIAL WITHOUT CONTRAST CT CERVICAL SPINE WITHOUT CONTRAST TECHNIQUE: Multidetector CT imaging of the head, cervical spine, and maxillofacial structures were performed using the standard protocol without intravenous contrast. Multiplanar CT image reconstructions of the cervical spine and maxillofacial structures were also generated. RADIATION DOSE REDUCTION: This exam was performed according to the departmental dose-optimization program which includes automated exposure control, adjustment of the mA and/or kV according to patient size and/or use of iterative reconstruction technique. COMPARISON:  None Available. FINDINGS: CT HEAD FINDINGS Brain: There is mild age-related atrophy and chronic microvascular ischemic changes. 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 Small right frontotemporal subdural hematoma measures approximately 4 mm in thickness. No mass effect or  midline shift. Vascular: No hyperdense vessel or unexpected calcification. Skull: No acute calvarial pathology.  Frontal craniotomy. Other: None CT MAXILLOFACIAL FINDINGS Osseous: Nondisplaced fracture of the anterior left mandible involving the alveolar ridge  of the left lateral incisor and canine teeth. There is fracture of the left lateral incisor tooth. Air pocket adjacent to the roots of the left central and lateral incisor and canine teeth suggestive of loosening. Right mandibular first molar tooth. Orbits: The globes and retro fat are preserved. Sinuses: Diffuse mucoperiosteal thickening and opacification of paranasal sinuses. No air-fluid level. The mastoid air cells are clear. Soft tissues: Soft tissue swelling of the lips. There is laceration of the skin over the chin. CT CERVICAL SPINE FINDINGS Alignment: No acute subluxation. Skull base and vertebrae: No acute fracture. Soft tissues and spinal canal: No prevertebral fluid or swelling. No visible canal hematoma. Disc levels:  No acute findings.  Degenerative changes. Upper chest: Negative. Other: None IMPRESSION: 1. Small right frontotemporal subdural hematoma. No mass effect or midline shift. 2. Nondisplaced fracture of the anterior left mandible involving the alveolar ridges of the lateral incisor and canine teeth. Fracture of the left lateral incisor tooth. 3. No acute/traumatic cervical spine pathology. These results were called by telephone at the time of interpretation on 01/14/2024 at 1:51 pm to Auston Blush, MD, who verbally acknowledged these results. Electronically Signed   By: Angus Bark M.D.   On: 01/14/2024 13:54   CT Cervical Spine Wo Contrast Result Date: 01/14/2024 CLINICAL DATA:  Fall and head trauma. EXAM: CT HEAD WITHOUT CONTRAST CT MAXILLOFACIAL WITHOUT CONTRAST CT CERVICAL SPINE WITHOUT CONTRAST TECHNIQUE: Multidetector CT imaging of the head, cervical spine, and maxillofacial structures were performed using the standard protocol without intravenous contrast. Multiplanar CT image reconstructions of the cervical spine and maxillofacial structures were also generated. RADIATION DOSE REDUCTION: This exam was performed according to the departmental dose-optimization program which  includes automated exposure control, adjustment of the mA and/or kV according to patient size and/or use of iterative reconstruction technique. COMPARISON:  None Available. FINDINGS: CT HEAD FINDINGS Brain: There is mild age-related atrophy and chronic microvascular ischemic changes. 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 Small right frontotemporal subdural hematoma measures approximately 4 mm in thickness. No mass effect or midline shift. Vascular: No hyperdense vessel or unexpected calcification. Skull: No acute calvarial pathology.  Frontal craniotomy. Other: None CT MAXILLOFACIAL FINDINGS Osseous: Nondisplaced fracture of the anterior left mandible involving the alveolar ridge of the left lateral incisor and canine teeth. There is fracture of the left lateral incisor tooth. Air pocket adjacent to the roots of the left central and lateral incisor and canine teeth suggestive of loosening. Right mandibular first molar tooth. Orbits: The globes and retro fat are preserved. Sinuses: Diffuse mucoperiosteal thickening and opacification of paranasal sinuses. No air-fluid level. The mastoid air cells are clear. Soft tissues: Soft tissue swelling of the lips. There is laceration of the skin over the chin. CT CERVICAL SPINE FINDINGS Alignment: No acute subluxation. Skull base and vertebrae: No acute fracture. Soft tissues and spinal canal: No prevertebral fluid or swelling. No visible canal hematoma. Disc levels:  No acute findings.  Degenerative changes. Upper chest: Negative. Other: None IMPRESSION: 1. Small right frontotemporal subdural hematoma. No mass effect or midline shift. 2. Nondisplaced fracture of the anterior left mandible involving the alveolar ridges of the lateral incisor and canine teeth. Fracture of the left lateral incisor tooth. 3. No acute/traumatic cervical spine pathology. These results were called by telephone  at the time of interpretation on 01/14/2024 at 1:51 pm to Auston Blush,  MD, who verbally acknowledged these results. Electronically Signed   By: Angus Bark M.D.   On: 01/14/2024 13:54    Medications:     Scheduled Medications:  acetaminophen   1,000 mg Oral Q6H   Chlorhexidine  Gluconate Cloth  6 each Topical Daily   clonazePAM   0.5 mg Oral QHS   docusate sodium   100 mg Oral BID   lacosamide   50 mg Oral BID   levETIRAcetam   500 mg Oral BID   methocarbamol   500 mg Oral Q8H   Or   methocarbamol  (ROBAXIN ) injection  500 mg Intravenous Q8H   pantoprazole   40 mg Oral BID   sertraline   25 mg Oral Daily   Infusions:  PRN Medications: hydrALAZINE , morphine  injection, mouth rinse, oxyCODONE , oxyCODONE , polyethylene glycol, prochlorperazine   Patient Profile   Tara [name] is a 77 y.o. AAF with chronic systolic HF, hx of SVT, CKD stage 3 and persistent a fib. Admitted with fall on Eliquis  post medication changed.   Assessment/Plan   Fall >> SDH L mandible fx - Suspect 2/2 polypharmacy (spiro and 2 different sleeping pills). SDH 2/2 fall on Eliquis . - Imaging revealed a frontotemporal SDH and nondisplaced fracture of L mandible - Repeat head CT without changes.  - Trauma following - Neurosurgery has been consulted.  - Holding eliquis  - Now with bradycardia and hypotension overnight down to upper 30s.   2. Bradycardia - in 40s on tele overnight, dropping to high 30s while awake this morning - hypotensive; possible related to fall - SDH unlikely to be large enough to be related - hold amio, not on any other nodal blockers - start midodrine  5 mg tid - start dopamine  at 3 mcg/kg/min - EP to see for PPM eval  3. Chronic mid-range HF (previously systolic) - Echo 1/61 EF ~ 40% range with normal RV, IVC normal.  - NYHA class II. Appears euvolemic. Low BP and CKD has limited GDMT.  - Hold diuresis and GDMT - Stop SGLT2i with UTI  - Can use hydralazine  PRN for SBP >150   4. CKD stage 3 - Baseline Scr 1.8 - 1.67>pending - off SGLT2i at this time  with UTI   5. Hx short RP SVT - No reoccurrences. Continue amiodarone    6. Persistent a fib - Persistent.  S/p DC-CV 11/24. She remains in NSR on amiodarone . - Hold Eliquis  with SDH - Continue amiodarone  200 mg daily.     7. Elevated LFTs - Have been following OP. ?d/t amio vs CHF - Hold statin with LFTs elevated at this time.  - Recently started for LDL >100.  - Refer to lipid clinic OP   8. UTI - Continue Rocephin . Per primary team - Stop SGLT2i  Length of Stay: 1  [place] Naomy Esham, NP  01/15/2024, 7:10 AM  Advanced Heart Failure Team Pager [phone] (M-F; 7a - 5p)  Please contact [facility] for night-coverage after hours (5p -7a ) and weekends on [email or web address]

## 2024-01-15 NOTE — Evaluation (Signed)
 Speech Language Pathology Evaluation Patient Details Name: Tara [name] MRN: [id number] DOB: 04-21-1946 Today's Date: 01/15/2024 Time: 4098-1191 SLP Time Calculation (min) (ACUTE ONLY): 13 min  Problem List:  Patient Active Problem List   Diagnosis Date Noted   Fall 01/14/2024   Pain and swelling of left lower leg 08/12/2023   Acute pain of left knee 08/12/2023   Acute foot pain, left 08/12/2023   Elevated liver enzymes 08/02/2023   Encounter for immunization 08/02/2023   Encounter for Medicare annual examination with abnormal findings 08/02/2023   Community acquired pneumonia of left lower lobe of lung 06/19/2023   Dysuria 06/03/2023   Impaired mobility and ADLs 01/05/2023   IGT (impaired glucose tolerance) 01/05/2023   Non-ischemic cardiomyopathy (HCC) 01/05/2023   Constipation 10/26/2022   Paroxysmal atrial fibrillation (HCC) 09/20/2022   Chronic anticoagulation 09/20/2022   Hiatal hernia 09/20/2022   Aspiration pneumonia (HCC) 08/14/2022   Seizure disorder (HCC)    Lethargy 05/15/2022   Dysfunction of right cardiac ventricle 05/07/2022   Acute postoperative anemia due to expected blood loss 05/07/2022   Dysphagia 05/07/2022   Palliative care encounter    Acute respiratory failure with hypoxia (HCC)    Aspiration into airway    Pressure injury of skin 04/07/2022   Meningioma (HCC) 03/24/2022   Seizure (HCC) 03/15/2022   Atrial fibrillation, chronic (HCC) 03/15/2022   Protein-calorie malnutrition, moderate (HCC) 03/15/2022   Atrial fibrillation with RVR (HCC)    Metabolic acidosis 02/27/2022   Anxiety and depression 02/27/2022   Meningioma, cerebral (HCC) 02/27/2022   Normocytic anemia 02/27/2022   Acute renal failure (ARF) (HCC) 02/27/2022   Hemorrhoids 01/04/2021   Ankle deformity, right 09/12/2020   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   Port-A-Cath in place 09/28/2018   Colon adenomas 08/06/2018   Malignant neoplasm of midline of right  female breast (HCC) 06/09/2018   Hypertension 01/25/2018   AKI (acute kidney injury) (HCC) 10/25/2016   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   Acute on chronic combined systolic and diastolic CHF (congestive heart failure) (HCC)    Demand ischemia (HCC)    Hypokalemia 09/29/2016   Prolonged Q-T interval on ECG 09/29/2016   Chronic kidney disease (CKD), stage III (moderate) (HCC) 09/29/2016   Leukocytosis 09/18/2016   Abnormal CT scan, chest    Upper airway cough syndrome  vs Cough variant asthma  04/09/2016   Chronic cough 03/17/2016   Seasonal allergies 08/08/2014   Family history of colon cancer 03/30/2014   Osteoporosis 02/14/2014   Metabolic syndrome X 02/05/2014   SVT (supraventricular tachycardia) (HCC) 03/14/2013   Chronic HFrEF (heart failure with reduced ejection fraction) (HCC) 03/14/2013   Vitamin D  deficiency 07/23/2010   Malignant neoplasm of right breast (HCC) 12/13/2007   Hyperlipidemia 12/13/2007   Anxiety state 12/13/2007   Depression, major, single episode, 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   GERD 12/13/2007   Past Medical History:  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   Angio-edema    Anxiety    Breast cancer (HCC) 2008   right - s/p lumpectomy->chemo, radiation   Depression    Dysrhythmia    hx SVT   Family history of colon cancer    Family history of prostate cancer    GERD (gastroesophageal reflux disease)    H/O: hysterectomy    Hiatal hernia    History of cancer chemotherapy    History of radiation therapy    Hyperlipidemia  Hypertension 01/25/2018   Nonischemic cardiomyopathy (HCC)    Personal history of radiation therapy 01/05/209   SVT (supraventricular tachycardia) (HCC)    short RP SVT documented 5/14   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HCC)    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)   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   Past Surgical History:  Past Surgical History:  Procedure Laterality Date   ABDOMINAL HYSTERECTOMY  1994   fibroids,    BIOPSY  06/19/2016   Procedure: BIOPSY;  Surgeon: Suzette Espy, MD;  Location: [facility];  Service: Endoscopy;;  gastric duodenum   BREAST BIOPSY Right 12/16/2006   malignant   BREAST EXCISIONAL BIOPSY Right 2018   benign lumpectomy   BREAST LUMPECTOMY Right 02/2007   BREAST LUMPECTOMY WITH RADIOACTIVE SEED LOCALIZATION Right 07/29/2017   Procedure: RIGHT BREAST LUMPECTOMY WITH RADIOACTIVE SEED LOCALIZATION;  Surgeon: Boyce Byes, MD;  Location: [facility] OR;  Service: General;  Laterality: Right;   BREAST SURGERY Right 2008   lumpectomy, cancer   CARDIAC CATHETERIZATION     CARDIOVERSION N/A 08/06/2023   Procedure: CARDIOVERSION (CATH LAB);  Surgeon: Darlis Eisenmenger, MD;  Location: [facility] INVASIVE CV LAB;  Service: Cardiovascular;  Laterality: N/A;   CHOLECYSTECTOMY  1999   COLONOSCOPY  2008   Dr. Nolene Baumgarten: multiple polyps. Path not available at time of visit.    COLONOSCOPY WITH PROPOFOL  N/A 04/25/2014   Dr. Nolene Baumgarten: Multiple tubular adenomas removed. Diverticulosis. Moderate internal hemorrhoids. Next colonoscopy planned for August 2018.   CRANIOTOMY Bilateral 03/24/2022   Procedure: Bifrontal craniotomy for resection of meningioma;  Surgeon: Audie Bleacher, MD;  Location: [facility] OR;  Service: Neurosurgery;  Laterality: Bilateral;   ESOPHAGOGASTRODUODENOSCOPY (EGD) WITH PROPOFOL  N/A 06/19/2016   Procedure: ESOPHAGOGASTRODUODENOSCOPY (EGD) WITH PROPOFOL ;  Surgeon: Suzette Espy, MD;  Location: [facility];  Service: Endoscopy;  Laterality: N/A;   MASTECTOMY W/ SENTINEL NODE BIOPSY Right 06/09/2018   Procedure: RIGHT TOTAL MASTECTOMY WITH SENTINEL LYMPH NODE BIOPSY;  Surgeon: Boyce Byes, MD;  Location: [facility] OR;  Service: General;  Laterality: Right;   POLYPECTOMY N/A 04/25/2014   Procedure: POLYPECTOMY;  Surgeon: Alyce Jubilee, MD;  Location: [facility]  ORS;  Service: Endoscopy;  Laterality: N/A;  Ascending and Decending Colon x3 , Transverse colon x2, rectal   PORTACATH PLACEMENT N/A 06/09/2018   Procedure: INSERTION PORT-A-CATH;  Surgeon: Boyce Byes, MD;  Location: [facility] OR;  Service: General;  Laterality: N/A;   RIGHT/LEFT HEART CATH AND CORONARY ANGIOGRAPHY N/A 07/10/2017   Procedure: RIGHT/LEFT HEART CATH AND CORONARY ANGIOGRAPHY;  Surgeon: Darlis Eisenmenger, MD;  Location: [facility] INVASIVE CV LAB;  Service: Cardiovascular;  Laterality: N/A;   HPI:  78 yo presenting after GLF. Admitted with SDH and mandible fx of alveolar ridge and teeth. Previous SLP documentation suggests that she is on a mechanical soft diet at baseline. MBS was done in 05/2023 due to concern for PNA, throat clearing and wet vocal quality with thin liquids, and hoarse vocal quality. This study showed no penetration or aspiration but there was some pharyngeal residue as well as concern for more of a primary esophageal component. PMH includes: meningioma (s/p craniotomy ), seizures, GERD, HH, aspiration PNA, breast ca (s/p chemo, XRT, mastectomy), CHF, TB with residual lung scarring   Assessment / Plan / Recommendation Clinical Impression  Pt is mostly oriented and can recount a very specific and detailed history about her recent  medical hx. She does get tangential in conversation but can be redirected (unclear how this may compare to her baseline). She has some difficulty with storage of information, needing several repetitions to try to remember four words for delayed recall task. Ultimately she used a memory compensatory strategy with Mod I that helped her retrieve all four words at a later time. She follows commands well and language is fluent. She feels like her mentation is a little declined today but thinks it may be secondary to other factors as well, including lack of sleep overnight and not feeling well today. She is agreeable to SLP f/u on subsequent to see if she feels like  her memory and attention are improving. It does sound like she has a lot of family support at home and that they have already been providing some assistance at home.     SLP Assessment  SLP Recommendation/Assessment: Patient needs continued Speech Lanaguage Pathology Services SLP Visit Diagnosis: Dysphagia, unspecified (R13.10)    Recommendations for follow up therapy are one component of a multi-disciplinary discharge planning process, led by the attending physician.  Recommendations may be updated based on patient status, additional functional criteria and insurance authorization.    Follow Up Recommendations  Home health SLP    Assistance Recommended at Discharge  Frequent or constant Supervision/Assistance  Functional Status Assessment Patient has had a recent decline in their functional status and demonstrates the ability to make significant improvements in function in a reasonable and predictable amount of time.  Frequency and Duration min 2x/week  2 weeks      SLP Evaluation Cognition  Overall Cognitive Status: No family/caregiver present to determine baseline cognitive functioning Arousal/Alertness: Awake/alert Orientation Level: Oriented to person;Oriented to situation;Oriented to place;Oriented to time Attention: Sustained Sustained Attention: Impaired Sustained Attention Impairment: Verbal complex (tangential in conversation) Memory: Impaired Memory Impairment: Storage deficit Awareness: Impaired Awareness Impairment: Intellectual impairment       Comprehension  Auditory Comprehension Overall Auditory Comprehension: Appears within functional limits for tasks assessed    Expression Expression Primary Mode of Expression: Verbal Verbal Expression Overall Verbal Expression: Appears within functional limits for tasks assessed   Oral / Motor  Oral Motor/Sensory Function Overall Oral Motor/Sensory Function: Generalized oral weakness (guarding on L side) Motor  Speech Overall Motor Speech: Appears within functional limits for tasks assessed            Beth Brooke., M.A. CCC-SLP Acute Rehabilitation Services Office: [phone]  Secure chat preferred  01/15/2024, 4:20 PM

## 2024-01-15 NOTE — Progress Notes (Addendum)
 Patient ID: Tara [name], female   DOB: [birth year], 78 y.o.   MRN: [id number]  This patient as discussed with ER last night and I reviewed CT scan with radiology needs oral surgery for the alveolar ridge mandible fracture she has with the teeth involved. This needs to be soon. I do not treat the teeth injuries. Aaron Aas

## 2024-01-15 NOTE — Progress Notes (Signed)
 OT Cancellation Note  Patient Details Name: Tara [name] MRN: [id number] DOB: [birth year]   Cancelled Treatment:    Reason Eval/Treat Not Completed: Medical issues which prohibited therapy (Per RN, pt with low heart rate requiring change in medication and with vomiting this morning. Per RN request, OT to hold at this time. OT to reattempt to see pt for skilled OT eval at a later time as appropriate/available.)  [name] "Jenine Mix" M., OTR/L, MA Acute Rehab [phone]  Walt Gunner 01/15/2024, 10:14 AM

## 2024-01-15 NOTE — Progress Notes (Signed)
 Subjective: The patient is alert and pleasant.  She looks well.  She is in no apparent distress.  Her husband is at the bedside.  Objective: Vital signs in last 24 hours: Temp:  [96.4 F (35.8 C)-98.4 F (36.9 C)] 98 F (36.7 C) (04/25 0421) Pulse Rate:  [35-71] 52 (04/25 0515) Resp:  [10-29] 19 (04/25 0515) BP: (76-146)/(41-111) 112/60 (04/25 0515) SpO2:  [87 %-99 %] 94 % (04/25 0515) Weight:  [64 kg] 64 kg (04/24 1109) Estimated body mass index is 29.47 kg/m as calculated from the following:   Height as of this encounter: 4\' 10"  (1.473 m).   Weight as of this encounter: 64 kg.   Intake/Output from previous day: 04/24 0701 - 04/25 0700 In: 30 [P.O.:30] Out: 300 [Urine:300] Intake/Output this shift: Total I/O In: 30 [P.O.:30] Out: 300 [Urine:300]  Physical exam the patient is alert and oriented x 3.  Her speech and strength is normal.  Lab Results: Recent Labs    01/14/24 1151  WBC 8.4  HGB 12.4  HCT 39.0  PLT 199   BMET Recent Labs    01/14/24 1151  NA 139  K 4.5  CL 108  CO2 21*  GLUCOSE 112*  BUN 34*  CREATININE 1.67*  CALCIUM  8.9    Studies/Results: CT HEAD WO CONTRAST ( ) Result Date: 01/15/2024 CLINICAL DATA:  Follow-up subdural hematoma EXAM: CT HEAD WITHOUT CONTRAST TECHNIQUE: Contiguous axial images were obtained from the base of the skull through the vertex without intravenous contrast. RADIATION DOSE REDUCTION: This exam was performed according to the departmental dose-optimization program which includes automated exposure control, adjustment of the mA and/or kV according to patient size and/or use of iterative reconstruction technique. COMPARISON:  Yesterday FINDINGS: Brain: Thin subdural hematoma versus dural thickening (see brain MRI 10/16/2022 which showed asymmetric dural thickening in this location) along the right cerebral convexity is unchanged at up to 3 mm in thickness. No cerebral mass effect. Bifrontal encephalomalacia deep to a  craniotomy site. No brain swelling today. Small chronic right cerebellar infarction. No evidence of acute infarct. No hydrocephalus. Vascular: No hyperdense vessel or unexpected calcification. Skull: Unremarkable bifrontal craniotomy site. Sinuses/Orbits: Generalized paranasal sinus opacification with maxillary fluid. IMPRESSION: Unchanged thin subdural hematoma or dural thickening along the right cerebral convexity. Electronically Signed   By: Ronnette Coke M.D.   On: 01/15/2024 05:03   DG Knee 2 Views Right Result Date: 01/14/2024 CLINICAL DATA:  Right knee pain after fall. EXAM: RIGHT KNEE - 1-2 VIEW COMPARISON:  March 08, 2016. FINDINGS: No evidence of fracture, dislocation, or joint effusion. No evidence of arthropathy. Mild patellar spurring is noted. Soft tissues are unremarkable. IMPRESSION: No acute abnormality seen. Electronically Signed   By: Rosalene Colon M.D.   On: 01/14/2024 14:21   DG Knee 2 Views Left Result Date: 01/14/2024 CLINICAL DATA:  Left knee pain after fall. EXAM: LEFT KNEE - 1-2 VIEW COMPARISON:  None Available. FINDINGS: No evidence of fracture, dislocation, or joint effusion. No evidence of arthropathy. Minimal patellar spurring is noted. Soft tissues are unremarkable. IMPRESSION: No acute abnormality seen. Electronically Signed   By: Rosalene Colon M.D.   On: 01/14/2024 14:20   CT Head Wo Contrast Result Date: 01/14/2024 CLINICAL DATA:  Fall and head trauma. EXAM: CT HEAD WITHOUT CONTRAST CT MAXILLOFACIAL WITHOUT CONTRAST CT CERVICAL SPINE WITHOUT CONTRAST TECHNIQUE: Multidetector CT imaging of the head, cervical spine, and maxillofacial structures were performed using the standard protocol without intravenous contrast. Multiplanar CT image reconstructions  of the cervical spine and maxillofacial structures were also generated. RADIATION DOSE REDUCTION: This exam was performed according to the departmental dose-optimization program which includes automated exposure control,  adjustment of the mA and/or kV according to patient size and/or use of iterative reconstruction technique. COMPARISON:  None Available. FINDINGS: CT HEAD FINDINGS Brain: There is mild age-related atrophy and chronic microvascular ischemic changes. 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 Small right frontotemporal subdural hematoma measures approximately 4 mm in thickness. No mass effect or midline shift. Vascular: No hyperdense vessel or unexpected calcification. Skull: No acute calvarial pathology.  Frontal craniotomy. Other: None CT MAXILLOFACIAL FINDINGS Osseous: Nondisplaced fracture of the anterior left mandible involving the alveolar ridge of the left lateral incisor and canine teeth. There is fracture of the left lateral incisor tooth. Air pocket adjacent to the roots of the left central and lateral incisor and canine teeth suggestive of loosening. Right mandibular first molar tooth. Orbits: The globes and retro fat are preserved. Sinuses: Diffuse mucoperiosteal thickening and opacification of paranasal sinuses. No air-fluid level. The mastoid air cells are clear. Soft tissues: Soft tissue swelling of the lips. There is laceration of the skin over the chin. CT CERVICAL SPINE FINDINGS Alignment: No acute subluxation. Skull base and vertebrae: No acute fracture. Soft tissues and spinal canal: No prevertebral fluid or swelling. No visible canal hematoma. Disc levels:  No acute findings.  Degenerative changes. Upper chest: Negative. Other: None IMPRESSION: 1. Small right frontotemporal subdural hematoma. No mass effect or midline shift. 2. Nondisplaced fracture of the anterior left mandible involving the alveolar ridges of the lateral incisor and canine teeth. Fracture of the left lateral incisor tooth. 3. No acute/traumatic cervical spine pathology. These results were called by telephone at the time of interpretation on 01/14/2024 at 1:51 pm to Auston Blush, MD, who verbally acknowledged these  results. Electronically Signed   By: Angus Bark M.D.   On: 01/14/2024 13:54   CT Maxillofacial Wo Contrast Result Date: 01/14/2024 CLINICAL DATA:  Fall and head trauma. EXAM: CT HEAD WITHOUT CONTRAST CT MAXILLOFACIAL WITHOUT CONTRAST CT CERVICAL SPINE WITHOUT CONTRAST TECHNIQUE: Multidetector CT imaging of the head, cervical spine, and maxillofacial structures were performed using the standard protocol without intravenous contrast. Multiplanar CT image reconstructions of the cervical spine and maxillofacial structures were also generated. RADIATION DOSE REDUCTION: This exam was performed according to the departmental dose-optimization program which includes automated exposure control, adjustment of the mA and/or kV according to patient size and/or use of iterative reconstruction technique. COMPARISON:  None Available. FINDINGS: CT HEAD FINDINGS Brain: There is mild age-related atrophy and chronic microvascular ischemic changes. 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 Small right frontotemporal subdural hematoma measures approximately 4 mm in thickness. No mass effect or midline shift. Vascular: No hyperdense vessel or unexpected calcification. Skull: No acute calvarial pathology.  Frontal craniotomy. Other: None CT MAXILLOFACIAL FINDINGS Osseous: Nondisplaced fracture of the anterior left mandible involving the alveolar ridge of the left lateral incisor and canine teeth. There is fracture of the left lateral incisor tooth. Air pocket adjacent to the roots of the left central and lateral incisor and canine teeth suggestive of loosening. Right mandibular first molar tooth. Orbits: The globes and retro fat are preserved. Sinuses: Diffuse mucoperiosteal thickening and opacification of paranasal sinuses. No air-fluid level. The mastoid air cells are clear. Soft tissues: Soft tissue swelling of the lips. There is laceration of the skin over the chin. CT CERVICAL SPINE FINDINGS Alignment: No  acute subluxation. Skull  base and vertebrae: No acute fracture. Soft tissues and spinal canal: No prevertebral fluid or swelling. No visible canal hematoma. Disc levels:  No acute findings.  Degenerative changes. Upper chest: Negative. Other: None IMPRESSION: 1. Small right frontotemporal subdural hematoma. No mass effect or midline shift. 2. Nondisplaced fracture of the anterior left mandible involving the alveolar ridges of the lateral incisor and canine teeth. Fracture of the left lateral incisor tooth. 3. No acute/traumatic cervical spine pathology. These results were called by telephone at the time of interpretation on 01/14/2024 at 1:51 pm to Auston Blush, MD, who verbally acknowledged these results. Electronically Signed   By: Angus Bark M.D.   On: 01/14/2024 13:54   CT Cervical Spine Wo Contrast Result Date: 01/14/2024 CLINICAL DATA:  Fall and head trauma. EXAM: CT HEAD WITHOUT CONTRAST CT MAXILLOFACIAL WITHOUT CONTRAST CT CERVICAL SPINE WITHOUT CONTRAST TECHNIQUE: Multidetector CT imaging of the head, cervical spine, and maxillofacial structures were performed using the standard protocol without intravenous contrast. Multiplanar CT image reconstructions of the cervical spine and maxillofacial structures were also generated. RADIATION DOSE REDUCTION: This exam was performed according to the departmental dose-optimization program which includes automated exposure control, adjustment of the mA and/or kV according to patient size and/or use of iterative reconstruction technique. COMPARISON:  None Available. FINDINGS: CT HEAD FINDINGS Brain: There is mild age-related atrophy and chronic microvascular ischemic changes. 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 Small right frontotemporal subdural hematoma measures approximately 4 mm in thickness. No mass effect or midline shift. Vascular: No hyperdense vessel or unexpected calcification. Skull: No acute calvarial pathology.  Frontal  craniotomy. Other: None CT MAXILLOFACIAL FINDINGS Osseous: Nondisplaced fracture of the anterior left mandible involving the alveolar ridge of the left lateral incisor and canine teeth. There is fracture of the left lateral incisor tooth. Air pocket adjacent to the roots of the left central and lateral incisor and canine teeth suggestive of loosening. Right mandibular first molar tooth. Orbits: The globes and retro fat are preserved. Sinuses: Diffuse mucoperiosteal thickening and opacification of paranasal sinuses. No air-fluid level. The mastoid air cells are clear. Soft tissues: Soft tissue swelling of the lips. There is laceration of the skin over the chin. CT CERVICAL SPINE FINDINGS Alignment: No acute subluxation. Skull base and vertebrae: No acute fracture. Soft tissues and spinal canal: No prevertebral fluid or swelling. No visible canal hematoma. Disc levels:  No acute findings.  Degenerative changes. Upper chest: Negative. Other: None IMPRESSION: 1. Small right frontotemporal subdural hematoma. No mass effect or midline shift. 2. Nondisplaced fracture of the anterior left mandible involving the alveolar ridges of the lateral incisor and canine teeth. Fracture of the left lateral incisor tooth. 3. No acute/traumatic cervical spine pathology. These results were called by telephone at the time of interpretation on 01/14/2024 at 1:51 pm to Auston Blush, MD, who verbally acknowledged these results. Electronically Signed   By: Angus Bark M.D.   On: 01/14/2024 13:54    Assessment/Plan: Subdural hematoma: Her follow-up head CT this morning is without change.  I would suggest holding her anticoagulation for a few days.  She can follow-up with me in the office in a couple weeks.  Please call if I can be of further assistance.  LOS: 1 day     Elder Greening 01/15/2024, 6:41 AM     Patient ID: Tara [name], female   DOB: [birth year], 78 y.o.   MRN: [id number]

## 2024-01-16 DIAGNOSIS — R001 Bradycardia, unspecified: Secondary | ICD-10-CM | POA: Diagnosis not present

## 2024-01-16 DIAGNOSIS — N183 Chronic kidney disease, stage 3 unspecified: Secondary | ICD-10-CM | POA: Diagnosis not present

## 2024-01-16 DIAGNOSIS — S02672A Fracture of alveolus of left mandible, initial encounter for closed fracture: Secondary | ICD-10-CM

## 2024-01-16 DIAGNOSIS — Z7189 Other specified counseling: Secondary | ICD-10-CM

## 2024-01-16 DIAGNOSIS — I959 Hypotension, unspecified: Secondary | ICD-10-CM | POA: Diagnosis not present

## 2024-01-16 DIAGNOSIS — I5022 Chronic systolic (congestive) heart failure: Secondary | ICD-10-CM | POA: Diagnosis not present

## 2024-01-16 DIAGNOSIS — Z515 Encounter for palliative care: Secondary | ICD-10-CM

## 2024-01-16 DIAGNOSIS — I480 Paroxysmal atrial fibrillation: Secondary | ICD-10-CM

## 2024-01-16 LAB — BASIC METABOLIC PANEL WITH GFR
Anion gap: 8 (ref 5–15)
BUN: 16 mg/dL (ref 8–23)
CO2: 22 mmol/L (ref 22–32)
Calcium: 8.5 mg/dL — ABNORMAL LOW (ref 8.9–10.3)
Chloride: 105 mmol/L (ref 98–111)
Creatinine, Ser: 1.25 mg/dL — ABNORMAL HIGH (ref 0.44–1.00)
GFR, Estimated: 44 mL/min — ABNORMAL LOW (ref >60)
Glucose, Bld: 94 mg/dL (ref 70–99)
Potassium: 4.1 mmol/L (ref 3.5–5.1)
Sodium: 135 mmol/L (ref 135–145)

## 2024-01-16 LAB — MAGNESIUM: Magnesium: 2.1 mg/dL (ref 1.7–2.4)

## 2024-01-16 NOTE — Progress Notes (Signed)
 Advanced Heart Failure Rounding Note  Cardiologist: None  Chief Complaint: Mechanical fall Subjective:    Heart rate in the 60s at the time of my exam, no further bradycardia on dopamine  and her BP is improved.   We discussed the reasoning for the pacemaker, given that bradycardia may be the etiology for her recurrent weakness and falls. She is ambivalent about PPM placement but if needed would agree. We discussed that we would wean dopamine  today and reassess.   Objective:    Weight Range: 64 kg Body mass index is 29.47 kg/m.   Vital Signs:   Temp:  [97.1 F (36.2 C)-98.8 F (37.1 C)] 97.5 F (36.4 C) (04/26 1100) Pulse Rate:  [47-70] 63 (04/26 1015) Resp:  [11-30] 23 (04/26 1015) BP: (81-145)/(42-70) 121/64 (04/26 1000) SpO2:  [80 %-95 %] 93 % (04/26 1015) Last BM Date :  (PTA)  Weight change: Filed Weights   01/14/24 1109  Weight: 64 kg   Intake/Output:  Intake/Output Summary (Last 24 hours) at 01/16/2024 1135 Last data filed at 01/16/2024 1000 Gross per 24 hour  Intake 642.44 ml  Output 1660 ml  Net -1017.56 ml    Physical Exam    General: Frail appearing, bruising over her left chin and neck Cardiac: JVP flat, bradycardic, regular rhythm Extremities: Warm and dry.  No edema.  Neuro: Alert and oriented x3. Affect pleasant.   Telemetry   SB 60-80s  Labs    CBC Recent Labs    01/14/24 1151 01/15/24 0850  WBC 8.4 8.4  NEUTROABS 6.2  --   HGB 12.4 11.5*  HCT 39.0 36.3  MCV 84.1 84.0  PLT 199 178   Basic Metabolic Panel Recent Labs    16/10/96 0850 01/16/24 0446  NA 137 135  K 4.2 4.1  CL 110 105  CO2 19* 22  GLUCOSE 135* 94  BUN 22 16  CREATININE 1.44* 1.25*  CALCIUM  7.9* 8.5*  MG  --  2.1   Liver Function Tests Recent Labs    01/14/24 1151  AST 237*  ALT 248*  ALKPHOS 104  BILITOT 0.3  PROT 7.0  ALBUMIN  3.0*   BNP: BNP (last 3 results) Recent Labs    08/07/23 1453 09/04/23 1610 12/15/23 1242  BNP 332.4* 102.1*  355.2*   Medications:     Scheduled Medications:  acetaminophen   650 mg Oral Q6H   Chlorhexidine  Gluconate Cloth  6 each Topical Daily   docusate sodium   100 mg Oral BID   feeding supplement  1 Container Oral TID BM   hydrOXYzine   10 mg Oral QHS   lacosamide   50 mg Oral BID   levETIRAcetam   500 mg Oral BID   methocarbamol   500 mg Oral Q8H   midodrine   5 mg Oral TID WC   pantoprazole   40 mg Oral BID   polyethylene glycol  17 g Oral BID   sertraline   25 mg Oral Daily   Infusions:  DOPamine  2.5 mcg/kg/min (01/16/24 1014)   PRN Medications: clonazePAM , mouth rinse, oxyCODONE , oxyCODONE   Patient Profile   Tara [name] is a 78 y.o. AAF with chronic systolic HF, hx of SVT, CKD stage 3 and persistent a fib. Admitted with fall on Eliquis  post medication changed.   Assessment/Plan   Fall >> SDH L mandible fx - Suspect 2/2 polypharmacy (spiro and 2 different sleeping pills). SDH 2/2 fall on Eliquis . - Imaging revealed a frontotemporal SDH and nondisplaced fracture of L mandible - Repeat head  CT without changes.  - Trauma following - Neurosurgery has been consulted.  - Holding eliquis  - Bradycardia improved on dopamine , suspect may have contributed as this is new for her  2. Bradycardia - in 40s on tele overnight, dropping to high 30s while awake this morning - hypotensive; possible related to fall - SDH unlikely to be large enough to be related - hold amio, not on any other nodal blockers - continue midodrine  5 mg tid - Reduce dopamine  to 2.5mcg/kg/min, off tomorrow - EP following, assess heart rate response once off dopamine   3. Chronic HF with improved EF - Echo 4/26 with normal EF and function (though on low dose dopamine ) - NYHA class II. Appears euvolemic. Low BP and CKD has limited GDMT.  - Hold diuresis and GDMT - Stop SGLT2i with pyuria   4. CKD stage 3 - Baseline Scr 1.8 - Creatinine 1.25 while holding GDMT - off SGLT2i at this time with pyuria, no symptoms  of UTI   5. Hx short RP SVT - No reoccurrences. Continue amiodarone    6.Paroxysmal a fib - paroxysmal.  S/p DC-CV 11/24. She remains in NSR on amiodarone . - Hold Eliquis  with SDH - Stop amiodarone  as above    7. Elevated LFTs - Have been following OP. ?d/t amio vs CHF - Hold statin with LFTs elevated at this time.  - Recently started for LDL >100.  - Refer to lipid clinic OP   8. Pyuria: - No symptoms, agree with holding further treatment - Stop SGLT2i  CRITICAL CARE Performed by: Lauralee Poll   Total critical care time: 35 minutes  Critical care time was exclusive of separately billable procedures and treating other patients.  Critical care was necessary to treat or prevent imminent or life-threatening deterioration.  Critical care was time spent personally by me on the following activities: development of treatment plan with patient and/or surrogate as well as nursing, discussions with consultants, evaluation of patient's response to treatment, examination of patient, obtaining history from patient or surrogate, ordering and performing treatments and interventions, ordering and review of laboratory studies, ordering and review of radiographic studies, pulse oximetry and re-evaluation of patient's condition.   Length of Stay: 2  Lauralee Poll, MD  01/16/2024, 11:35 AM  Advanced Heart Failure Team Pager [phone] (M-F; 7a - 5p)  Please contact [facility] for night-coverage after hours (5p -7a ) and weekends on [email or web address]

## 2024-01-16 NOTE — Progress Notes (Signed)
 Physical Therapy Treatment Patient Details Name: Tara [name] MRN: [id number] DOB: [birth year] Today's Date: 01/16/2024   History of Present Illness 78 y/o female admitted 01/14/24 after fall at home with chin laceration and mandibular fx with teeth displacement and small frontotemporal SDH.  PMH positive for DVT 2018, breast cancer, GERD, HLD, CHFw/ EF 40-45%, a fib on Eliquis , meningioma (s/p resection 2024), h/o aspiration PNA, and CKD 3.    PT Comments  Patient initially reluctant, however RN provided pain medication and persuaded pt to participate. Once up walking (CGA x 100 ft with RW), she reported she felt better than expected. Patient made tremendous progress and now anticipate no PT needs on discharge.     If plan is discharge home, recommend the following: A little help with walking and/or transfers;A little help with bathing/dressing/bathroom;Help with stairs or ramp for entrance;Assist for transportation;Assistance with cooking/housework   Can travel by private vehicle        Equipment Recommendations  None recommended by PT    Recommendations for Other Services       Precautions / Restrictions Precautions Precautions: Fall Restrictions Weight Bearing Restrictions Per Provider Order: No     Mobility  Bed Mobility Overal bed mobility: Needs Assistance Bed Mobility: Supine to Sit     Supine to sit: HOB elevated, Contact guard     General bed mobility comments: incr time and effort    Transfers Overall transfer level: Needs assistance Equipment used: Rolling walker (2 wheels) Transfers: Sit to/from Stand Sit to Stand: Contact guard assist           General transfer comment: h/o 3 recent falls, therefore CGA for safety    Ambulation/Gait Ambulation/Gait assistance: Contact guard assist Gait Distance (Feet): 100 Feet Assistive device: Rolling walker (2 wheels) Gait Pattern/deviations: WFL(Within Functional Limits)   Gait velocity interpretation: 1.31  - 2.62 ft/sec, indicative of limited community ambulator   General Gait Details: HR 53-69 with activity   Stairs             Wheelchair Mobility     Tilt Bed    Modified Rankin (Stroke Patients Only)       Balance Overall balance assessment: No apparent balance deficits (not formally assessed), History of Falls                                          Communication Communication Communication: No apparent difficulties  Cognition Arousal: Alert Behavior During Therapy: WFL for tasks assessed/performed   PT - Cognitive impairments: No apparent impairments                         Following commands: Intact      Cueing Cueing Techniques: Verbal cues  Exercises      General Comments General comments (skin integrity, edema, etc.): Patient may require PPM (bradycardia may have caused falls)      Pertinent Vitals/Pain Pain Assessment Pain Assessment: 0-10 Pain Score: 5  Pain Location: jaw Pain Descriptors / Indicators: Sore Pain Intervention(s): Limited activity within patient's tolerance, Monitored during session    Home Living                          Prior Function            PT Goals (current goals can now be found in  the care plan section) Acute Rehab PT Goals Patient Stated Goal: feel better, go home PT Goal Formulation: With patient Time For Goal Achievement: 01/28/24 Potential to Achieve Goals: Good Progress towards PT goals: Progressing toward goals    Frequency    Min 3X/week      PT Plan      Co-evaluation              AM-PAC PT "6 Clicks" Mobility   Outcome Measure  Help needed turning from your back to your side while in a flat bed without using bedrails?: None Help needed moving from lying on your back to sitting on the side of a flat bed without using bedrails?: A Little Help needed moving to and from a bed to a chair (including a wheelchair)?: A Little Help needed standing up  from a chair using your arms (e.g., wheelchair or bedside chair)?: A Little Help needed to walk in hospital room?: A Little Help needed climbing 3-5 steps with a railing? : A Little 6 Click Score: 19    End of Session Equipment Utilized During Treatment: Gait belt Activity Tolerance: Patient tolerated treatment well Patient left: with call bell/phone within reach;in chair;with nursing/sitter in room (RN deferred need for chair alarm) Nurse Communication: Mobility status PT Visit Diagnosis: History of falling (Z91.81);Other abnormalities of gait and mobility (R26.89)     Time: 6213-0865 PT Time Calculation (min) (ACUTE ONLY): 25 min  Charges:    $Gait Training: 23-37 mins PT General Charges $$ ACUTE PT VISIT: 1 Visit                      Gayle Kava, PT Acute Rehabilitation Services  Office [phone]    Tara [name] 01/16/2024, 12:53 PM

## 2024-01-16 NOTE — Consult Note (Signed)
 Palliative Care Consult Note                                  Date: 01/16/2024   Patient Name: Tara [name]  DOB: 08-09-1946  MRN: [id number]  Age / Sex: 78 y.o., female  PCP: Towanda Fret, MD Referring Physician: Md, Trauma, MD  Reason for Consultation: Establishing goals of care  HPI/Patient Profile: 78 y.o. female  with past medical history of DVT 2018, breast cancer, GERD, HLD, CHFw/ EF 40-45%, a-fib on Eliquis , meningioma (s/p resection 2024), h/o aspiration PNA, and CKD 3  admitted on 01/14/2024 after a fall at home with chin laceration and madibular fx with teeth displacement and small frontotemporal SDH.   During this hospitalization she has had bradycardia and is on dopamine .  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   Angio-edema    Anxiety    Breast cancer (HCC) 2008   right - s/p lumpectomy->chemo, radiation   Depression    Dysrhythmia    hx SVT   Family history of colon cancer    Family history of prostate cancer    GERD (gastroesophageal reflux disease)    H/O: hysterectomy    Hiatal hernia    History of cancer chemotherapy    History of radiation therapy    Hyperlipidemia    Hypertension 01/25/2018   Nonischemic cardiomyopathy (HCC)    Personal history of radiation therapy 01/05/209   SVT (supraventricular tachycardia) (HCC)    short RP SVT documented 5/14   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HCC)    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  I introduced Palliative Medicine  as specialized medical care for people living with serious illness. It focuses on providing relief from symptoms and stress of a serious illness. The goal is to improve quality of life for both the patient and the family.  Today's Discussion: Patient and her husband share their understanding of the patient's chronic and acute hospitalization. They share that the patient had just completed cardio rehab the day before her fall. Prior to this admission she was ambulating without an assistive device. She was able to complete most ADLs independently or with a little help. She has somebody with her almost all the time at home. Her appetite has been good and she has no problems eating as long as she eats softer foods.  The patient is married and lives with her husband Royston [name].They have two children a daughter who is a Engineer, civil (consulting) and lives close by and a son in [place]. They have grandchildren and great grandchildren. 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  The patient and her husband share the story of  her hospitalization in 2023 where she nearly died. The patient asks many questions to her husband who has a better understanding of some of her care back then. We discuss the importance of advanced directives. We discussed code status. Recommended consideration of DNR status, understanding evidenced-based poor outcomes in similar hospitalized patients, as the cause of the arrest is likely associated with chronic/terminal disease rather than a reversible acute cardio-pulmonary event. At this time patient would like to remain full code. We discuss scope of care. She would like to remain full scope at this time. She wants to discuss further with her family. She wants to discuss further with her family. MOST form given to patient and family to review. 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 We  discuss further and she is not necessarily against intubation just tracheostomy. She also shared she would not want to live in a nursing home. She indicates she would not want life sustaining interventions if she had a big stroke but would if she had a little stroke.Encouraged her to continue to think about her goals of care and what interventions would be acceptable to her in the future.   Discussed the importance of continued conversation with family and the medical providers regarding overall plan of care and treatment options, ensuring decisions are within the context of the patient's values and GOCs.  Questions and concerns were addressed. Hard Choices booklet left for review. The patient and family was encouraged to call with questions or concerns. PMT will continue to support holistically.  Review of Systems  HENT:         Soreness from fall    Objective:   Primary Diagnoses: Present on Admission:  Fall   Physical Exam Vitals reviewed.  Constitutional:      General: She is not in acute distress. HENT:     Head: Laceration present.     Comments: Bruising left jaw Cardiovascular:     Rate and Rhythm: Normal rate.  Pulmonary:     Effort: Pulmonary effort is normal.  Skin:    General: Skin is warm and dry.  Neurological:     Mental Status: She is alert and oriented to person, place, and time.  Psychiatric:        Mood and Affect: Mood normal.        Behavior: Behavior normal.        Thought Content: Thought content normal.     Vital Signs:  BP (!) 118/59   Pulse 67   Temp (!) 97.1 F (36.2 C) (Axillary)   Resp 20   Ht 4\' 10"  (1.473 m)   Wt 64 kg   SpO2 90%   BMI 29.47 kg/m    Advanced Care Planning:   Existing Vynca/ACP Documentation: None  Primary Decision Maker: PATIENT  Code Status/Advance Care Planning: Full code   Assessment & Plan:   SUMMARY OF RECOMMENDATIONS   Full code Full scope Encouraged patient and family to continue discussions  around goals of care PMT to follow up tomorrow 01/17/24  Discussed with: bedside RN  Time Total: 90 minutes    Thank you for allowing us  to participate in the care of Tara [name] PMT will continue to support holistically.  Signed by: Joaquim Muir, NP Palliative Medicine Team  Team Phone # [phone] (Nights/Weekends)  01/16/2024, 9:52 AM

## 2024-01-16 NOTE — Progress Notes (Signed)
 Patient ID: Tara [name], female   DOB: Apr 07, 1946, 78 y.o.   MRN: [id number] Follow up - Trauma Critical Care   Patient Details:    Tara [name] is an 78 y.o. female.  Lines/tubes : External Urinary Catheter (Active)  Dedicated Suction Verified suction is between 40-80 mmHg 01/16/24 0800  Securement Method None needed 01/16/24 0800  Site Assessment Clean, Dry, Intact 01/16/24 0800  Intervention No interventions needed at this time 01/16/24 0800  Output (mL) 600 mL 01/16/24 0400    Microbiology/Sepsis markers: Results for orders placed or performed during the hospital encounter of 01/14/24  MRSA Next Gen by PCR, Nasal     Status: None   Collection Time: 01/14/24  8:40 PM   Specimen: Nasal Mucosa; Nasal Swab  Result Value Ref Range Status   MRSA by PCR Next Gen NOT DETECTED NOT DETECTED Final    Comment: (NOTE) The GeneXpert MRSA Assay (FDA approved for NASAL specimens only), is one component of a comprehensive MRSA colonization surveillance program. It is not intended to diagnose MRSA infection nor to guide or monitor treatment for MRSA infections. Test performance is not FDA approved in patients less than 52 years old. Performed at [facility], [address], [place], [place] [address]    *Note: Due to a large number of results and/or encounters for the requested time period, some results have not been displayed. A complete set of results can be found in Results Review.    Anti-infectives:  Anti-infectives (From admission, onward)    Start     Dose/Rate Route Frequency Ordered Stop   01/14/24 1545  cefTRIAXone  (ROCEPHIN ) 2 g in sodium chloride  0.9 % 100 mL IVPB        2 g 200 mL/hr over 30 Minutes Intravenous  Once 01/14/24 1542 01/15/24 1915      Subjective:    Overnight Issues:   Objective:  Vital signs for last 24 hours: Temp:  [97.1 F (36.2 C)-98.8 F (37.1 C)] 97.1 F (36.2 C) (04/26 0700) Pulse Rate:  [38-82] 67 (04/26 0815) Resp:  [11-30]  20 (04/26 0815) BP: (81-145)/(42-67) 118/59 (04/26 0815) SpO2:  [80 %-95 %] 90 % (04/26 0815)  Hemodynamic parameters for last 24 hours:    Intake/Output from previous day: 04/25 0701 - 04/26 0700 In: 379.3 [P.O.:240; I.V.:89.3] Out: 1660 [Urine:1660]  Intake/Output this shift: Total I/O In: 5.9 [I.V.:5.9] Out: -   Vent settings for last 24 hours:    Physical Exam:  General: alert and no respiratory distress Neuro: alert, oriented, and MAE HEENT/Neck: L mandible lac CDI Resp: clear to auscultation bilaterally CVS: RRR now GI: benign Extremities: edema 1+  Results for orders placed or performed during the hospital encounter of 01/14/24 (from the past 24 hours)  CBC     Status: Abnormal   Collection Time: 01/15/24  8:50 AM  Result Value Ref Range   WBC 8.4 4.0 - 10.5 K/uL   RBC 4.32 3.87 - 5.11 MIL/uL   Hemoglobin 11.5 (L) 12.0 - 15.0 g/dL   HCT 19.1 47.8 - 29.5 %   MCV 84.0 80.0 - 100.0 fL   MCH 26.6 26.0 - 34.0 pg   MCHC 31.7 30.0 - 36.0 g/dL   RDW 62.1 (H) 30.8 - 65.7 %   Platelets 178 150 - 400 K/uL   nRBC 0.0 0.0 - 0.2 %  Basic metabolic panel     Status: Abnormal   Collection Time: 01/15/24  8:50 AM  Result Value Ref  Range   Sodium 137 135 - 145 mmol/L   Potassium 4.2 3.5 - 5.1 mmol/L   Chloride 110 98 - 111 mmol/L   CO2 19 (L) 22 - 32 mmol/L   Glucose, Bld 135 (H) 70 - 99 mg/dL   BUN 22 8 - 23 mg/dL   Creatinine, Ser 4.09 (H) 0.44 - 1.00 mg/dL   Calcium  7.9 (L) 8.9 - 10.3 mg/dL   GFR, Estimated 37 (L) >60 mL/min   Anion gap 8 5 - 15  CK     Status: None   Collection Time: 01/15/24  8:50 AM  Result Value Ref Range   Total CK 213 38 - 234 U/L  Basic metabolic panel     Status: Abnormal   Collection Time: 01/16/24  4:46 AM  Result Value Ref Range   Sodium 135 135 - 145 mmol/L   Potassium 4.1 3.5 - 5.1 mmol/L   Chloride 105 98 - 111 mmol/L   CO2 22 22 - 32 mmol/L   Glucose, Bld 94 70 - 99 mg/dL   BUN 16 8 - 23 mg/dL   Creatinine, Ser 8.11 (H) 0.44  - 1.00 mg/dL   Calcium  8.5 (L) 8.9 - 10.3 mg/dL   GFR, Estimated 44 (L) >60 mL/min   Anion gap 8 5 - 15  Magnesium      Status: None   Collection Time: 01/16/24  4:46 AM  Result Value Ref Range   Magnesium  2.1 1.7 - 2.4 mg/dL   *Note: Due to a large number of results and/or encounters for the requested time period, some results have not been displayed. A complete set of results can be found in Results Review.    Assessment & Plan: Present on Admission:  Fall    LOS: 2 days   Additional comments:I reviewed the patient's new clinical lab test results. / GLF  SDH - NSGY c/s, Dr. Larrie Po, s/p Kcentra, repeat CT stable. H/o meningioma removed last year with a history of seizures on keppra  BID at baseline, continue.  TBI therapies Mandible fx of aveolar ridge and teeth - outpatient dentistry follow up for management of this. D/W WF L sided edema and ecchymosis of neck secondary to above - monitor.  No issues with phonation or trouble swallowing.  No intervention needed, but monitor neck exam A fib on eliquis  - hold Eliquis . H/o two falls recently, fall this admission and on 4/20.  CHF, EF 40-45% - HF team managing. Goes into AF brady and BP drops, in NSR BP better, now on Dopa 5 CKD 3 GERD- BID protonix , home dose History of meningioma/seizures - s/p resection, continue BID Keppra  HLD H/O breast cancer Elevated LFTs - ?amio related per Dr. Charline Contras last note.  These are quite increased in the last 5 months. Resume amio Prediabetes - hgbA1C in 2024 6.2 FEN - adv to soft diet OK but patient wants clears only, boost breeze added VTE - hold Eliquis , SCDs ID - none needed Dispo - ICU, I spoke with her husband Critical Care Total Time*: 32 Minutes  Tara Gander, MD, MPH, FACS Trauma & General Surgery Use [email or web address] to contact on call provider  01/16/2024  *Care during the described time interval was provided by me. I have reviewed this patient's available data, including medical  history, events of note, physical examination and test results as part of my evaluation.

## 2024-01-17 DIAGNOSIS — R001 Bradycardia, unspecified: Secondary | ICD-10-CM | POA: Diagnosis not present

## 2024-01-17 LAB — BASIC METABOLIC PANEL WITH GFR
Anion gap: 9 (ref 5–15)
BUN: 14 mg/dL (ref 8–23)
CO2: 20 mmol/L — ABNORMAL LOW (ref 22–32)
Calcium: 8.5 mg/dL — ABNORMAL LOW (ref 8.9–10.3)
Chloride: 106 mmol/L (ref 98–111)
Creatinine, Ser: 1.15 mg/dL — ABNORMAL HIGH (ref 0.44–1.00)
GFR, Estimated: 49 mL/min — ABNORMAL LOW (ref >60)
Glucose, Bld: 91 mg/dL (ref 70–99)
Potassium: 4.2 mmol/L (ref 3.5–5.1)
Sodium: 135 mmol/L (ref 135–145)

## 2024-01-17 LAB — CBC
HCT: 37.3 % (ref 36.0–46.0)
Hemoglobin: 11.9 g/dL — ABNORMAL LOW (ref 12.0–15.0)
MCH: 26.5 pg (ref 26.0–34.0)
MCHC: 31.9 g/dL (ref 30.0–36.0)
MCV: 83.1 fL (ref 80.0–100.0)
Platelets: 177 10*3/uL (ref 150–400)
RBC: 4.49 MIL/uL (ref 3.87–5.11)
RDW: 15.6 % — ABNORMAL HIGH (ref 11.5–15.5)
WBC: 8 10*3/uL (ref 4.0–10.5)
nRBC: 0 % (ref 0.0–0.2)

## 2024-01-17 LAB — MAGNESIUM: Magnesium: 2 mg/dL (ref 1.7–2.4)

## 2024-01-17 MED ORDER — SENNOSIDES-DOCUSATE SODIUM 8.6-50 MG PO TABS
2.0000 | ORAL_TABLET | Freq: Two times a day (BID) | ORAL | Status: DC
  Administered 2024-01-17 – 2024-01-19 (×3): 2 via ORAL
  Filled 2024-01-17 (×4): qty 2

## 2024-01-17 NOTE — Progress Notes (Signed)
 Patient ID: Tara [name], female   DOB: 1946-06-23, 78 y.o.   MRN: [id number] Follow up - Trauma Critical Care   Patient Details:    Tara [name] is an 78 y.o. female.  Lines/tubes : External Urinary Catheter (Active)  Dedicated Suction Verified suction is between 40-80 mmHg 01/16/24 2000  Securement Method None needed 01/16/24 2000  Site Assessment Clean, Dry, Intact 01/16/24 2000  Intervention No interventions needed at this time 01/16/24 2000  Output (mL) 700 mL 01/17/24 0000    Microbiology/Sepsis markers: Results for orders placed or performed during the hospital encounter of 01/14/24  MRSA Next Gen by PCR, Nasal     Status: None   Collection Time: 01/14/24  8:40 PM   Specimen: Nasal Mucosa; Nasal Swab  Result Value Ref Range Status   MRSA by PCR Next Gen NOT DETECTED NOT DETECTED Final    Comment: (NOTE) The GeneXpert MRSA Assay (FDA approved for NASAL specimens only), is one component of a comprehensive MRSA colonization surveillance program. It is not intended to diagnose MRSA infection nor to guide or monitor treatment for MRSA infections. Test performance is not FDA approved in patients less than 18 years old. Performed at [facility], [address], [place], [place] [address]    *Note: Due to a large number of results and/or encounters for the requested time period, some results have not been displayed. A complete set of results can be found in Results Review.    Anti-infectives:  Anti-infectives (From admission, onward)    Start     Dose/Rate Route Frequency Ordered Stop   01/14/24 1545  cefTRIAXone  (ROCEPHIN ) 2 g in sodium chloride  0.9 % 100 mL IVPB        2 g 200 mL/hr over 30 Minutes Intravenous  Once 01/14/24 1542 01/15/24 1915      Subjective:    Overnight Issues:   Objective:  Vital signs for last 24 hours: Temp:  [96.9 F (36.1 C)-98.1 F (36.7 C)] 97.2 F (36.2 C) (04/27 0726) Pulse Rate:  [42-67] 56 (04/27 0715) Resp:  [8-25] 17  (04/27 0715) BP: (79-146)/(37-100) 105/52 (04/27 0700) SpO2:  [88 %-100 %] 99 % (04/27 0715) Weight:  [66.5 kg] 66.5 kg (04/27 0600)  Hemodynamic parameters for last 24 hours:    Intake/Output from previous day: 04/26 0701 - 04/27 0700 In: 379.1 [P.O.:300; I.V.:79.1] Out: 1100 [Urine:1100]  Intake/Output this shift: No intake/output data recorded.  Vent settings for last 24 hours:    Physical Exam:  General: alert and no respiratory distress Neuro: alert and oriented HEENT/Neck: evolving L mandible ecchymoses, lac CDI Resp: clear to auscultation bilaterally CVS: RRR GI: soft, NT Extremities: calves soft  Results for orders placed or performed during the hospital encounter of 01/14/24 (from the past 24 hours)  Basic metabolic panel     Status: Abnormal   Collection Time: 01/17/24  2:59 AM  Result Value Ref Range   Sodium 135 135 - 145 mmol/L   Potassium 4.2 3.5 - 5.1 mmol/L   Chloride 106 98 - 111 mmol/L   CO2 20 (L) 22 - 32 mmol/L   Glucose, Bld 91 70 - 99 mg/dL   BUN 14 8 - 23 mg/dL   Creatinine, Ser 6.21 (H) 0.44 - 1.00 mg/dL   Calcium  8.5 (L) 8.9 - 10.3 mg/dL   GFR, Estimated 49 (L) >60 mL/min   Anion gap 9 5 - 15  Magnesium      Status: None   Collection Time:  01/17/24  2:59 AM  Result Value Ref Range   Magnesium  2.0 1.7 - 2.4 mg/dL  CBC     Status: Abnormal   Collection Time: 01/17/24  2:59 AM  Result Value Ref Range   WBC 8.0 4.0 - 10.5 K/uL   RBC 4.49 3.87 - 5.11 MIL/uL   Hemoglobin 11.9 (L) 12.0 - 15.0 g/dL   HCT 16.1 09.6 - 04.5 %   MCV 83.1 80.0 - 100.0 fL   MCH 26.5 26.0 - 34.0 pg   MCHC 31.9 30.0 - 36.0 g/dL   RDW 40.9 (H) 81.1 - 91.4 %   Platelets 177 150 - 400 K/uL   nRBC 0.0 0.0 - 0.2 %   *Note: Due to a large number of results and/or encounters for the requested time period, some results have not been displayed. A complete set of results can be found in Results Review.    Assessment & Plan: Present on Admission:  Fall    LOS: 3 days    Additional comments:I reviewed the patient's new clinical lab test results. / GLF  SDH - NSGY c/s, Dr. Larrie Po, s/p Kcentra, repeat CT stable. H/o meningioma removed last year with a history of seizures on keppra  BID at baseline, continue.  TBI therapies Mandible fx of aveolar ridge and teeth - outpatient dentistry follow up for management of this. D/W WF L sided edema and ecchymosis of neck secondary to above - monitor.  No issues with phonation or trouble swallowing.  No intervention needed, but monitor neck exam A fib on eliquis  - hold Eliquis . H/o two falls recently, fall this admission and on 4/20.  CHF, EF 40-45% - HF team managing. Goes into AF brady and BP drops, in NSR BP better, now on Dopa 5 CKD 3 GERD- BID protonix , home dose History of meningioma/seizures - s/p resection, continue BID Keppra  HLD H/O breast cancer Elevated LFTs - ?amio related per Dr. Charline Contras last note.  These are quite increased in the last 5 months. Resume amio Prediabetes - hgbA1C in 2024 6.2 FEN - adv to soft diet OK but patient wants clears only, tolerating boost breeze well VTE - hold Eliquis , SCDs ID - none needed Dispo - ICU, I spoke with her husband Appreciate Heart Failure team. May need pacemaker. Critical Care Total Time*: 33 Minutes  Dorena Gander, MD, MPH, FACS Trauma & General Surgery Use [email or web address] to contact on call provider  01/17/2024  *Care during the described time interval was provided by me. I have reviewed this patient's available data, including medical history, events of note, physical examination and test results as part of my evaluation.

## 2024-01-17 NOTE — Progress Notes (Signed)
 Advanced Heart Failure Rounding Note  Cardiologist: None  Chief Complaint: Mechanical fall Subjective:    Heart rate stable is in the high 50s.  Dopamine  turned off this morning.  Patient walked yesterday with assistance, heart rate increased to the 70s/80s, not symptomatic.  Discussed with nursing at bedside, will repeat today.  If she shows good heart rate response and blood pressure stable off dopamine  then does likely not need pacemaker and her symptoms are more likely related to orthostasis and dehydration.  If stable off dopamine  can leave the ICU tomorrow.  Would hold Eliquis  given significant recent fall.  Outpatient discussion about resuming  versus Watchman.   Objective:    Weight Range: 66.5 kg Body mass index is 30.64 kg/m.   Vital Signs:   Temp:  [96.9 F (36.1 C)-98.1 F (36.7 C)] 97.2 F (36.2 C) (04/27 0726) Pulse Rate:  [42-65] 57 (04/27 0845) Resp:  [8-25] 14 (04/27 0845) BP: (79-146)/(37-100) 116/61 (04/27 0845) SpO2:  [88 %-100 %] 94 % (04/27 0845) Weight:  [66.5 kg] 66.5 kg (04/27 0600) Last BM Date :  (PTA)  Weight change: Filed Weights   01/14/24 1109 01/17/24 0600  Weight: 64 kg 66.5 kg   Intake/Output:  Intake/Output Summary (Last 24 hours) at 01/17/2024 1610 Last data filed at 01/17/2024 0700 Gross per 24 hour  Intake 367.43 ml  Output 1100 ml  Net -732.57 ml    Physical Exam    General: Frail appearing, bruising over her left chin and neck Cardiac: JVP flat, bradycardic, regular rhythm Extremities: Warm and dry.  No edema.  Neuro: Alert and oriented x3. Affect pleasant.   Telemetry   SB 50-60s with PACs  Labs    CBC Recent Labs    01/14/24 1151 01/15/24 0850 01/17/24 0259  WBC 8.4 8.4 8.0  NEUTROABS 6.2  --   --   HGB 12.4 11.5* 11.9*  HCT 39.0 36.3 37.3  MCV 84.1 84.0 83.1  PLT 199 178 177   Basic Metabolic Panel Recent Labs    96/04/54 0446 01/17/24 0259  NA 135 135  K 4.1 4.2  CL 105 106  CO2 22 20*   GLUCOSE 94 91  BUN 16 14  CREATININE 1.25* 1.15*  CALCIUM  8.5* 8.5*  MG 2.1 2.0   Liver Function Tests Recent Labs    01/14/24 1151  AST 237*  ALT 248*  ALKPHOS 104  BILITOT 0.3  PROT 7.0  ALBUMIN  3.0*   BNP: BNP (last 3 results) Recent Labs    08/07/23 1453 09/04/23 1610 12/15/23 1242  BNP 332.4* 102.1* 355.2*   Medications:     Scheduled Medications:  acetaminophen   650 mg Oral Q6H   Chlorhexidine  Gluconate Cloth  6 each Topical Daily   feeding supplement  1 Container Oral TID BM   hydrOXYzine   10 mg Oral QHS   lacosamide   50 mg Oral BID   levETIRAcetam   500 mg Oral BID   midodrine   5 mg Oral TID WC   pantoprazole   40 mg Oral BID   polyethylene glycol  17 g Oral BID   senna-docusate  2 tablet Oral BID   sertraline   25 mg Oral Daily   Infusions:   PRN Medications: clonazePAM , mouth rinse, oxyCODONE , oxyCODONE   Patient Profile   Tara [name] is a 78 y.o. AAF with chronic systolic HF, hx of SVT, CKD stage 3 and persistent a fib. Admitted with fall on Eliquis  post medication changed.   Assessment/Plan  Fall >> SDH L mandible fx - Suspect 2/2 polypharmacy (spiro and 2 different sleeping pills). SDH 2/2 fall on Eliquis . - Imaging revealed a frontotemporal SDH and nondisplaced fracture of L mandible - Repeat head CT without changes.  - Trauma following - Neurosurgery has been consulted.  - Holding eliquis , would hold until outpatient - Holding MRA/lasix  as below. Workup of bradycardia  2. Bradycardia - Noted on arrival - hypotensive; possible related to fall - SDH unlikely to be large enough to be related - hold amio, not on any other nodal blockers - continue midodrine  5 mg tid - Dopamine  started 4/25 and turned off 4/27 - If heart rate responsive can likely avoid PPM - EP following, appreciate assistance  3. Chronic HF with improved EF - Echo 4/26 with normal EF and function (though on low dose dopamine ) - NYHA class II. Appears euvolemic.  Low BP and CKD has limited GDMT.  - Hold diuresis and GDMT - Stop SGLT2i with pyuria   4. CKD stage 3 - Baseline Scr 1.8 - Creatinine 1.25 while holding GDMT - off SGLT2i at this time with pyuria, no symptoms of UTI   5. Hx short RP SVT - No reoccurrences. Continue amiodarone    6.Paroxysmal a fib - paroxysmal.  S/p DC-CV 11/24. She remains in NSR on amiodarone . - Hold Eliquis  with SDH - Stop amiodarone  as above    7. Elevated LFTs - Have been following OP. ?d/t amio vs CHF - Hold statin with LFTs elevated at this time as well as muscle weakness, though CK normal - Recently started for LDL >100.  - Refer to lipid clinic OP   8. Pyuria: - No symptoms, agree with holding further treatment - Stop SGLT2i    Length of Stay: 3  Lauralee Poll, MD  01/17/2024, 9:52 AM  Advanced Heart Failure Team Pager [phone] (M-F; 7a - 5p)  Please contact [facility] for night-coverage after hours (5p -7a ) and weekends on [email or web address]

## 2024-01-17 NOTE — Progress Notes (Signed)
 Trauma Event Note    Pt sitting up in bed, husband at bedside- Primary RN passing meds- denies complaints, though states her jaw hurts when she talks too much-   Last imported Vital Signs BP 125/68   Pulse (!) 57   Temp (!) 97.2 F (36.2 C) (Axillary)   Resp 18   Ht 4\' 10"  (1.473 m)   Wt 146 lb 9.7 oz (66.5 kg)   SpO2 96%   BMI 30.64 kg/m   Trending CBC Recent Labs    01/14/24 1151 01/15/24 0850 01/17/24 0259  WBC 8.4 8.4 8.0  HGB 12.4 11.5* 11.9*  HCT 39.0 36.3 37.3  PLT 199 178 177    Trending Coag's No results for input(s): "APTT", "INR" in the last 72 hours.  Trending BMET Recent Labs    01/15/24 0850 01/16/24 0446 01/17/24 0259  NA 137 135 135  K 4.2 4.1 4.2  CL 110 105 106  CO2 19* 22 20*  BUN 22 16 14   CREATININE 1.44* 1.25* 1.15*  GLUCOSE 135* 94 91      Sriram Febles M Altan Kraai  Trauma Response RN  Please call TRN at [phone] for further assistance.

## 2024-01-17 NOTE — Progress Notes (Signed)
 Daily Progress Note   Patient Name: Tara [name]       Date: 01/17/2024 DOB: Jul 24, 1946  Age: 78 y.o. MRN#: [id number] Attending Physician: MdDesiderio Floras, MD Primary Care Physician: Towanda Fret, MD Admit Date: 01/14/2024  Reason for Consultation/Follow-up: Establishing goals of care  Length of Stay: 3  Current Medications: Scheduled Meds:   acetaminophen   650 mg Oral Q6H   Chlorhexidine  Gluconate Cloth  6 each Topical Daily   feeding supplement  1 Container Oral TID BM   hydrOXYzine   10 mg Oral QHS   lacosamide   50 mg Oral BID   levETIRAcetam   500 mg Oral BID   midodrine   5 mg Oral TID WC   pantoprazole   40 mg Oral BID   polyethylene glycol  17 g Oral BID   senna-docusate  2 tablet Oral BID   sertraline   25 mg Oral Daily    Continuous Infusions:   PRN Meds: clonazePAM , mouth rinse, oxyCODONE , oxyCODONE   Physical Exam Vitals reviewed.  Constitutional:      General: She is not in acute distress. Cardiovascular:     Rate and Rhythm: Bradycardia present.  Pulmonary:     Effort: Pulmonary effort is normal.  Neurological:     Mental Status: She is alert and oriented to person, place, and time.  Psychiatric:        Mood and Affect: Mood normal.        Behavior: Behavior normal.        Thought Content: Thought content normal.             Vital Signs: BP 125/68   Pulse (!) 57   Temp (!) 97.2 F (36.2 C) (Axillary)   Resp 18   Ht 4\' 10"  (1.473 m)   Wt 66.5 kg   SpO2 96%   BMI 30.64 kg/m  SpO2: SpO2: 96 % O2 Device: O2 Device: Nasal Cannula O2 Flow Rate: O2 Flow Rate (L/min): 2 L/min    Patient Active Problem List   Diagnosis Date Noted   Fall 01/14/2024   Pain and swelling of left lower leg 08/12/2023   Acute pain of left knee 08/12/2023   Acute foot  pain, left 08/12/2023   Elevated liver enzymes 08/02/2023   Encounter for immunization 08/02/2023   Encounter for Medicare annual examination with abnormal  findings 08/02/2023   Community acquired pneumonia of left lower lobe of lung 06/19/2023   Dysuria 06/03/2023   Impaired mobility and ADLs 01/05/2023   IGT (impaired glucose tolerance) 01/05/2023   Non-ischemic cardiomyopathy (HCC) 01/05/2023   Constipation 10/26/2022   Paroxysmal atrial fibrillation (HCC) 09/20/2022   Chronic anticoagulation 09/20/2022   Hiatal hernia 09/20/2022   Aspiration pneumonia (HCC) 08/14/2022   Seizure disorder (HCC)    Lethargy 05/15/2022   Dysfunction of right cardiac ventricle 05/07/2022   Acute postoperative anemia due to expected blood loss 05/07/2022   Dysphagia 05/07/2022   Palliative care encounter    Acute respiratory failure with hypoxia (HCC)    Aspiration into airway    Pressure injury of skin 04/07/2022   Meningioma (HCC) 03/24/2022   Seizure (HCC) 03/15/2022   Atrial fibrillation, chronic (HCC) 03/15/2022   Protein-calorie malnutrition, moderate (HCC) 03/15/2022   Atrial fibrillation with RVR (HCC)    Metabolic acidosis 02/27/2022   Anxiety and depression 02/27/2022   Meningioma, cerebral (HCC) 02/27/2022   Normocytic anemia 02/27/2022   Acute renal failure (ARF) (HCC) 02/27/2022   Hemorrhoids 01/04/2021   Ankle deformity, right 09/12/2020   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   Port-A-Cath in place 09/28/2018   Colon adenomas 08/06/2018   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 (HCC) 06/09/2018   Hypertension 01/25/2018   AKI (acute kidney injury) (HCC) 10/25/2016   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   Acute on chronic combined systolic and diastolic CHF (congestive heart failure) (HCC)    Demand ischemia (HCC)    Hypokalemia 09/29/2016   Prolonged Q-T interval on ECG 09/29/2016   Chronic kidney disease (CKD), stage III (moderate) (HCC) 09/29/2016    Leukocytosis 09/18/2016   Abnormal CT scan, chest    Upper airway cough syndrome  vs Cough variant asthma  04/09/2016   Chronic cough 03/17/2016   Seasonal allergies 08/08/2014   Family history of colon cancer 03/30/2014   Osteoporosis 02/14/2014   Metabolic syndrome X 02/05/2014   SVT (supraventricular tachycardia) (HCC) 03/14/2013   Chronic HFrEF (heart failure with reduced ejection fraction) (HCC) 03/14/2013   Vitamin D  deficiency 07/23/2010   Malignant neoplasm of right breast (HCC) 12/13/2007   Hyperlipidemia 12/13/2007   Anxiety state 12/13/2007   Depression, major, single episode, 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 female  with past medical history of DVT 2018, breast cancer, GERD, HLD, CHFw/ EF 40-45%, a-fib on Eliquis , meningioma (s/p resection 2024), h/o aspiration PNA, and CKD 3  admitted on 01/14/2024 after a fall at home with chin laceration and madibular fx with teeth displacement and small frontotemporal SDH.    During this hospitalization she has had bradycardia and required dopamine  which is now discontinued.  Today's Discussion: Patient sitting up in bed eating breakfast. She shared she feels pretty good overall. Her husband is at bedside. They did not have the opportunity to discuss goals of care with their daughter yesterday as planned. 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 We discuss PMT following up early this week to allow the patient time to review the Hard Choices booklet and allow time for the family to discuss together.   Encouraged patient and her husband to reach out to PMT if they have any questions or concerns.  Recommendations/Plan: Full code Full scope Encouraged patient and family to continue discussions around goals of care Discharge home PMT  to follow up early this week (to give patient time to discuss with her daughter)   Code Status:    Code  Status Orders  (From admission, onward)           Start     Ordered   01/14/24 1537  Full code  Continuous       Question:  By:  Answer:  Other   01/14/24 1554         Extensive chart review has been completed prior to seeing the patient including labs, vital signs, imaging, progress/consult notes, orders, medications, and available advance directive documents. Update received from nursing.  Care plan was discussed with bedside RN  Time spent: 25 minutes  Thank you for allowing the Palliative Medicine Team to assist in the care of this patient.     Daina Drum, NP  Please contact Palliative Medicine Team phone at [phone] for questions and concerns.

## 2024-01-17 NOTE — Progress Notes (Signed)
 Pt SpO2 reads 88% on RA while sleeping, declined supplemental oxygen  at this time

## 2024-01-18 ENCOUNTER — Encounter (HOSPITAL_COMMUNITY): Payer: Medicare Other

## 2024-01-18 LAB — BASIC METABOLIC PANEL WITH GFR
Anion gap: 8 (ref 5–15)
BUN: 19 mg/dL (ref 8–23)
CO2: 22 mmol/L (ref 22–32)
Calcium: 8.6 mg/dL — ABNORMAL LOW (ref 8.9–10.3)
Chloride: 104 mmol/L (ref 98–111)
Creatinine, Ser: 1.31 mg/dL — ABNORMAL HIGH (ref 0.44–1.00)
GFR, Estimated: 42 mL/min — ABNORMAL LOW (ref >60)
Glucose, Bld: 88 mg/dL (ref 70–99)
Potassium: 4.2 mmol/L (ref 3.5–5.1)
Sodium: 134 mmol/L — ABNORMAL LOW (ref 135–145)

## 2024-01-18 LAB — MAGNESIUM: Magnesium: 2.1 mg/dL (ref 1.7–2.4)

## 2024-01-18 NOTE — Progress Notes (Addendum)
 Advanced Heart Failure Rounding Note  Cardiologist: None  Chief Complaint: Mechanical fall   Patient Profile   78 y/o  AAF with chronic systolic HF==>HFimEF, hx of SVT, CKD stage 3 and persistent a fib. Admitted with fall while on Eliquis , post medication change. Found to be both hypotensive and bradycardic on arrival, felt 2/2 polypharmacy. Fall resulted in subsequent SDH and left mandible fx. Required Dopamine . Now weaned off, hemodynamics now stable. Echo EF 55% and otherwise normal.    Subjective:    Dopamine  turned off yesterday morning. Hemodynamics stable. No further bradycardia nor hypotension.   Ambulated w/ PT yesterday. Did ok. No orthostatic symptoms.   Denies headache. C/w left jaw pain but feels improving. Eating solids difficult. Requested soft/liquid diet.   Objective:    Weight Range: 66.5 kg Body mass index is 30.64 kg/m.   Vital Signs:   Temp:  [96.8 F (36 C)-98.3 F (36.8 C)] 97.3 F (36.3 C) (04/28 0700) Pulse Rate:  [46-67] 53 (04/28 0600) Resp:  [11-27] 18 (04/28 0600) BP: (82-135)/(39-79) 119/58 (04/28 0600) SpO2:  [89 %-98 %] 92 % (04/28 0600) Last BM Date :  (PTA)  Weight change: Filed Weights   01/14/24 1109 01/17/24 0600  Weight: 64 kg 66.5 kg   Intake/Output:  Intake/Output Summary (Last 24 hours) at 01/18/2024 0747 Last data filed at 01/18/2024 0600 Gross per 24 hour  Intake 1080 ml  Output 1050 ml  Net 30 ml    Physical Exam    General:  thin/ frail appearing elderly female. No respiratory difficulty HEENT: left mandibular ecchymosis/ swelling  Neck: supple. no JVD.  Cor: Regular rate & rhythm. No rubs, gallops or murmurs. Lungs: clear Abdomen: soft, nontender, nondistended.  Extremities: no cyanosis, clubbing, rash, edema Neuro: alert & oriented x 3, cranial nerves grossly intact. moves all 4 extremities w/o difficulty. Affect pleasant.   Telemetry   NSR 60s, personally reviewed   Labs    CBC Recent Labs     01/15/24 0850 01/17/24 0259  WBC 8.4 8.0  HGB 11.5* 11.9*  HCT 36.3 37.3  MCV 84.0 83.1  PLT 178 177   Basic Metabolic Panel Recent Labs    40/98/11 0259 01/18/24 0335  NA 135 134*  K 4.2 4.2  CL 106 104  CO2 20* 22  GLUCOSE 91 88  BUN 14 19  CREATININE 1.15* 1.31*  CALCIUM  8.5* 8.6*  MG 2.0 2.1   Liver Function Tests No results for input(s): "AST", "ALT", "ALKPHOS", "BILITOT", "PROT", "ALBUMIN " in the last 72 hours.  BNP: BNP (last 3 results) Recent Labs    08/07/23 1453 09/04/23 1610 12/15/23 1242  BNP 332.4* 102.1* 355.2*   Medications:     Scheduled Medications:  acetaminophen   650 mg Oral Q6H   Chlorhexidine  Gluconate Cloth  6 each Topical Daily   feeding supplement  1 Container Oral TID BM   hydrOXYzine   10 mg Oral QHS   lacosamide   50 mg Oral BID   levETIRAcetam   500 mg Oral BID   midodrine   5 mg Oral TID WC   pantoprazole   40 mg Oral BID   polyethylene glycol  17 g Oral BID   senna-docusate  2 tablet Oral BID   sertraline   25 mg Oral Daily   Infusions:   PRN Medications: clonazePAM , mouth rinse, oxyCODONE , oxyCODONE   Assessment/Plan   Fall >> SDH L mandible fx - Suspect 2/2 polypharmacy (spiro and 2 different sleeping pills). SDH 2/2 fall on Eliquis . -  Imaging revealed a frontotemporal SDH and nondisplaced fracture of L mandible - Repeat head CT without changes.  - Trauma and neurosurgery teams following - Holding eliquis , would hold until outpatient  2. Bradycardia/Hypotension  - Noted on arrival - hypotensive; possible related to fall - SDH unlikely to be large enough to be related - holding amio, not on any other nodal blockers - Dopamine  started 4/25 and turned off 4/27. Maintaining stable hemodynamics - continue midodrine  5 mg tid. Keep off amio  - EP following, appreciate assistance - Consider outpatient Zio once discharged   3. Chronic HF with improved EF - Echo 4/26 with normal EF and function (though on low dose  dopamine ) - NYHA class II. Euvolemic on exam  - Low BP and CKD has limited GDMT.  - Hold diuresis and GDMT - SGLT2i discontinued on admit due to pyuria   4. CKD stage 3 - Baseline Scr 1.8 - SCr stable 1.3 today  - off SGLT2i at this time with pyuria, no symptoms of UTI   5. Hx short RP SVT - No reoccurrences. Continue amiodarone    6.Paroxysmal a fib - paroxysmal.  S/p DC-CV 11/24. She remains in NSR on amiodarone . - Hold Eliquis  with SDH - Holding amiodarone  as above    7. Elevated LFTs - Have been following OP. ?d/t amio vs CHF - Hold statin with LFTs elevated at this time as well as muscle weakness, though CK normal - Recently started for LDL >100.  - Refer to lipid clinic OP   8. Pyuria: - No symptoms, agree with holding further treatment - SGLT2i discontinued.   Plan to transfer out of ICU today. Recommend Zio at discharge.   Length of Stay: [address], [place]  01/18/2024, 7:47 AM  Advanced Heart Failure Team Pager [phone] (M-F; 7a - 5p)  Please contact [facility] for night-coverage after hours (5p -7a ) and weekends on [email or web address]

## 2024-01-18 NOTE — Progress Notes (Signed)
 Physical Therapy Treatment Patient Details Name: Tara [name] MRN: [id number] DOB: 01/29/1946 Today's Date: 01/18/2024   History of Present Illness 78 y/o female admitted 01/14/24 after fall at home with chin laceration and mandibular fx with teeth displacement and small frontotemporal SDH.  PMH positive for DVT 2018, breast cancer, GERD, HLD, CHFw/ EF 40-45%, a fib on Eliquis , meningioma (s/p resection 2024), h/o aspiration PNA, and CKD 3.    PT Comments  Patient eager to ambulate. Donned her own socks and shoes in long-sitting on bed with incr time due to tight fit (she did not want help eventhough she had to struggle to get them on). Bed mobility modified independent. Transfers and gait with RW with CGA for safety due to h/o falls. Discussed discharge planning and pt reports she plans to use RW from now on to decr her fall risk. She does not feel she needs HHPT as she has had it in the past and "knows everything they are going to tell me to do." Agree with no follow-up PT on discharge.     If plan is discharge home, recommend the following: A little help with walking and/or transfers;A little help with bathing/dressing/bathroom;Help with stairs or ramp for entrance;Assist for transportation;Assistance with cooking/housework   Can travel by private vehicle        Equipment Recommendations  None recommended by PT    Recommendations for Other Services       Precautions / Restrictions Precautions Precautions: Fall Precaution/Restrictions Comments: pt reports recent fall "my legs just gave way" previous falls due to imbalance Restrictions Weight Bearing Restrictions Per Provider Order: No     Mobility  Bed Mobility Overal bed mobility: Modified Independent Bed Mobility: Supine to Sit     Supine to sit: HOB elevated, Modified independent (Device/Increase time) (HOB 20)     General bed mobility comments: incr time and effort    Transfers Overall transfer level: Needs  assistance Equipment used: Rolling walker (2 wheels) Transfers: Sit to/from Stand Sit to Stand: Contact guard assist           General transfer comment: CGA for safety; vc for reaching back to surface and controlled descent with pt "flopping"    Ambulation/Gait Ambulation/Gait assistance: Contact guard assist Gait Distance (Feet): 170 Feet Assistive device: Rolling walker (2 wheels) Gait Pattern/deviations: WFL(Within Functional Limits)   Gait velocity interpretation: 1.31 - 2.62 ft/sec, indicative of limited community ambulator   General Gait Details: reports she plans to use the RW at home bc she doesn't want to fall anymore   Stairs             Wheelchair Mobility     Tilt Bed    Modified Rankin (Stroke Patients Only)       Balance Overall balance assessment: History of Falls                                          Communication Communication Communication: No apparent difficulties  Cognition Arousal: Alert Behavior During Therapy: WFL for tasks assessed/performed   PT - Cognitive impairments: No apparent impairments                         Following commands: Intact      Cueing Cueing Techniques: Verbal cues  Exercises      General Comments General comments (skin integrity, edema, etc.):  HR 61-69; BP 120/54 supine, 131/64 seated after walking      Pertinent Vitals/Pain Pain Assessment Pain Assessment: Faces Faces Pain Scale: Hurts a little bit Pain Location: jaw Pain Descriptors / Indicators: Sore Pain Intervention(s): Limited activity within patient's tolerance    Home Living                          Prior Function            PT Goals (current goals can now be found in the care plan section) Acute Rehab PT Goals Patient Stated Goal: feel better, go home Time For Goal Achievement: 01/28/24 Potential to Achieve Goals: Good Progress towards PT goals: Progressing toward goals     Frequency    Min 3X/week      PT Plan      Co-evaluation              AM-PAC PT "6 Clicks" Mobility   Outcome Measure  Help needed turning from your back to your side while in a flat bed without using bedrails?: None Help needed moving from lying on your back to sitting on the side of a flat bed without using bedrails?: None Help needed moving to and from a bed to a chair (including a wheelchair)?: A Little Help needed standing up from a chair using your arms (e.g., wheelchair or bedside chair)?: A Little Help needed to walk in hospital room?: A Little Help needed climbing 3-5 steps with a railing? : A Little 6 Click Score: 20    End of Session Equipment Utilized During Treatment: Gait belt Activity Tolerance: Patient tolerated treatment well Patient left: with call bell/phone within reach;in chair (RN deferred need for chair alarm) Nurse Communication: Mobility status PT Visit Diagnosis: History of falling (Z91.81);Other abnormalities of gait and mobility (R26.89)     Time: 2956-2130 PT Time Calculation (min) (ACUTE ONLY): 27 min  Charges:    $Gait Training: 23-37 mins PT General Charges $$ ACUTE PT VISIT: 1 Visit                      Gayle Kava, PT Acute Rehabilitation Services  Office [phone]    [place] [facility] 01/18/2024, 1:05 PM

## 2024-01-18 NOTE — Progress Notes (Signed)
 Patient ID: Tara [name], female   DOB: 04-Apr-1946, 78 y.o.   MRN: [id number] Follow up - Trauma Critical Care   Patient Details:    Tara [name] is an 78 y.o. female.  Lines/tubes : External Urinary Catheter (Active)  Dedicated Suction Verified suction is between 40-80 mmHg 01/18/24 0800  Securement Method None needed 01/18/24 0800  Site Assessment Clean, Dry, Intact 01/18/24 0800  Intervention External Catheter Replaced 01/18/24 0800  Output (mL) 700 mL 01/18/24 0600    Microbiology/Sepsis markers: Results for orders placed or performed during the hospital encounter of 01/14/24  MRSA Next Gen by PCR, Nasal     Status: None   Collection Time: 01/14/24  8:40 PM   Specimen: Nasal Mucosa; Nasal Swab  Result Value Ref Range Status   MRSA by PCR Next Gen NOT DETECTED NOT DETECTED Final    Comment: (NOTE) The GeneXpert MRSA Assay (FDA approved for NASAL specimens only), is one component of a comprehensive MRSA colonization surveillance program. It is not intended to diagnose MRSA infection nor to guide or monitor treatment for MRSA infections. Test performance is not FDA approved in patients less than 6 years old. Performed at [facility], [address] [place], [place] [address]    *Note: Due to a large number of results and/or encounters for the requested time period, some results have not been displayed. A complete set of results can be found in Results Review.    Anti-infectives:  Anti-infectives (From admission, onward)    Start     Dose/Rate Route Frequency Ordered Stop   01/14/24 1545  cefTRIAXone  (ROCEPHIN ) 2 g in sodium chloride  0.9 % 100 mL IVPB        2 g 200 mL/hr over 30 Minutes Intravenous  Once 01/14/24 1542 01/15/24 1915      Subjective:    Overnight Issues:   Objective:  Vital signs for last 24 hours: Temp:  [96.8 F (36 C)-98.3 F (36.8 C)] 97.3 F (36.3 C) (04/28 0700) Pulse Rate:  [46-67] 59 (04/28 0805) Resp:  [11-27] 12 (04/28  0805) BP: (82-135)/(39-79) 119/55 (04/28 0805) SpO2:  [89 %-96 %] 94 % (04/28 0805)  Hemodynamic parameters for last 24 hours:    Intake/Output from previous day: 04/27 0701 - 04/28 0700 In: 1080 [P.O.:1080] Out: 1050 [Urine:1050]  Intake/Output this shift: No intake/output data recorded.  Vent settings for last 24 hours:    Physical Exam:  General: no distress Neuro: alert, oriented, and nonfocal exam HEENT/Neck: L facial lac CDI, ecchymoses evolving Resp: clear to auscultation bilaterally CVS: RRR GI: benign Extremities: edema 1+  Results for orders placed or performed during the hospital encounter of 01/14/24 (from the past 24 hours)  Basic metabolic panel     Status: Abnormal   Collection Time: 01/18/24  3:35 AM  Result Value Ref Range   Sodium 134 (L) 135 - 145 mmol/L   Potassium 4.2 3.5 - 5.1 mmol/L   Chloride 104 98 - 111 mmol/L   CO2 22 22 - 32 mmol/L   Glucose, Bld 88 70 - 99 mg/dL   BUN 19 8 - 23 mg/dL   Creatinine, Ser 1.91 (H) 0.44 - 1.00 mg/dL   Calcium  8.6 (L) 8.9 - 10.3 mg/dL   GFR, Estimated 42 (L) >60 mL/min   Anion gap 8 5 - 15  Magnesium      Status: None   Collection Time: 01/18/24  3:35 AM  Result Value Ref Range   Magnesium  2.1 1.7 -  2.4 mg/dL   *Note: Due to a large number of results and/or encounters for the requested time period, some results have not been displayed. A complete set of results can be found in Results Review.    Assessment & Plan: Present on Admission:  Fall    LOS: 4 days   Additional comments:I reviewed the patient's new clinical lab test results. / GLF  SDH - NSGY c/s, Dr. Larrie Po, s/p Kcentra, repeat CT stable. H/o meningioma removed last year with a history of seizures on keppra  BID at baseline, continue.  TBI therapies Mandible fx of aveolar ridge and teeth - outpatient dentistry follow up for management of this. D/W WF L sided edema and ecchymosis of neck secondary to above - monitor.  No issues with phonation  or trouble swallowing.  No intervention needed, but monitor neck exam A fib on eliquis  - hold Eliquis . H/o two falls recently, fall this admission and on 4/20.  CHF, EF 40-45% - HF team managing. Goes into AF brady and BP drops, in NSR BP better, off dopamine  CKD 3 GERD- BID protonix , home dose History of meningioma/seizures - s/p resection, continue BID Keppra  HLD H/O breast cancer Elevated LFTs - ?amio related per Dr. Charline Contras last note.  These are quite increased in the last 5 months. Resume amio Prediabetes - hgbA1C in 2024 6.2 FEN - adv to soft diet OK but patient mostly wants clears  VTE - hold Eliquis , SCDs ID - none needed Dispo - ICU, I spoke with her husband Appreciate Heart Failure team. May need pacemaker. Critical Care Total Time*: 32 Minutes  Dorena Gander, MD, MPH, FACS Trauma & General Surgery Use [email or web address] to contact on call provider  01/18/2024  *Care during the described time interval was provided by me. I have reviewed this patient's available data, including medical history, events of note, physical examination and test results as part of my evaluation.

## 2024-01-18 NOTE — Plan of Care (Signed)
  Problem: Activity: Goal: Risk for activity intolerance will decrease Outcome: Progressing   Problem: Nutrition: Goal: Adequate nutrition will be maintained Outcome: Progressing   Problem: Coping: Goal: Level of anxiety will decrease Outcome: Progressing   Problem: Pain Managment: Goal: General experience of comfort will improve and/or be controlled Outcome: Progressing   Problem: Safety: Goal: Ability to remain free from injury will improve Outcome: Progressing

## 2024-01-18 NOTE — Progress Notes (Signed)
 OT Cancellation Note  Patient Details Name: Tara [name] MRN: [id number] DOB: 09-01-1946   Cancelled Treatment:    Reason Eval/Treat Not Completed: Other (comment) (Pt eating lunch and asked this therapist to return at a later time.)  [facility] 01/18/2024, 11:49 AM Milburn Aliment, OT/L   Acute OT Clinical Specialist Acute Rehabilitation Services Pager [phone] Office [phone]

## 2024-01-18 NOTE — Progress Notes (Signed)
 Speech Language Pathology Treatment: Dysphagia  Patient Details Name: Tara [name] MRN: [id number] DOB: [birth year] Today's Date: 01/18/2024 Time: 5284-1324 SLP Time Calculation (min) (ACUTE ONLY): 14 min  Assessment / Plan / Recommendation Clinical Impression  Pt was changed to a clear liquid diet, reporting that this was her preference due to pain with chewing as opposed to ongoing nausea (she reports no N/V). She says she was able to chew some soft chicken and peas her husband brought in and is interested in trying other soft foods, like scrambled eggs or oatmeal. She ate only the purees offered by SLP and had no overt difficulty with this or thin liquids. Discussed diet options with pt, and she thinks she wants to try more food as long as it is soft enough, and she can chew it on her R side. She has no interest in a puree diet but is willing to consider Dys 2 (finely chopped). Will adjust diet order accordingly.   HPI HPI: 78 yo presenting after GLF. Admitted with SDH and mandible fx of alveolar ridge and teeth. Previous SLP documentation suggests that she is on a mechanical soft diet at baseline. MBS was done in 05/2023 due to concern for PNA, throat clearing and wet vocal quality with thin liquids, and hoarse vocal quality. This study showed no penetration or aspiration but there was some pharyngeal residue as well as concern for more of a primary esophageal component. PMH includes: meningioma (s/p craniotomy ), seizures, GERD, HH, aspiration PNA, breast ca (s/p chemo, XRT, mastectomy), CHF, TB with residual lung scarring      SLP Plan  Continue with current plan of care      Recommendations for follow up therapy are one component of a multi-disciplinary discharge planning process, led by the attending physician.  Recommendations may be updated based on patient status, additional functional criteria and insurance authorization.    Recommendations  Diet recommendations: Dysphagia 2 (fine  chop);Thin liquid Liquids provided via: Cup;Straw Medication Administration: Whole meds with puree Supervision: Patient able to self feed;Intermittent supervision to cue for compensatory strategies Compensations: Slow rate;Small sips/bites;Follow solids with liquid Postural Changes and/or Swallow Maneuvers: Seated upright 90 degrees;Upright 30-60 min after meal                  Oral care BID   Frequent or constant Supervision/Assistance Dysphagia, unspecified (R13.10)     Continue with current plan of care     Beth Brooke., M.A. CCC-SLP Acute Rehabilitation Services Office: [phone]  Secure chat preferred   01/18/2024, 12:39 PM

## 2024-01-18 NOTE — Plan of Care (Signed)

## 2024-01-18 NOTE — Plan of Care (Signed)
  Problem: Clinical Measurements: Goal: Will remain free from infection Outcome: Progressing Goal: Diagnostic test results will improve Outcome: Progressing Goal: Respiratory complications will improve Outcome: Progressing Goal: Cardiovascular complication will be avoided Outcome: Progressing   Problem: Activity: Goal: Risk for activity intolerance will decrease Outcome: Progressing   Problem: Nutrition: Goal: Adequate nutrition will be maintained Outcome: Progressing   Problem: Coping: Goal: Level of anxiety will decrease Outcome: Progressing   

## 2024-01-19 ENCOUNTER — Inpatient Hospital Stay (HOSPITAL_COMMUNITY)
Admit: 2024-01-19 | Discharge: 2024-01-19 | Disposition: A | Discharge status: Home / Self Care | Attending: Cardiology | Admitting: Cardiology

## 2024-01-19 ENCOUNTER — Other Ambulatory Visit (HOSPITAL_COMMUNITY): Payer: Self-pay

## 2024-01-19 DIAGNOSIS — W19XXXD Unspecified fall, subsequent encounter: Secondary | ICD-10-CM

## 2024-01-19 DIAGNOSIS — R001 Bradycardia, unspecified: Secondary | ICD-10-CM

## 2024-01-19 LAB — BASIC METABOLIC PANEL WITH GFR
Anion gap: 9 (ref 5–15)
BUN: 26 mg/dL — ABNORMAL HIGH (ref 8–23)
CO2: 24 mmol/L (ref 22–32)
Calcium: 8.8 mg/dL — ABNORMAL LOW (ref 8.9–10.3)
Chloride: 103 mmol/L (ref 98–111)
Creatinine, Ser: 1.31 mg/dL — ABNORMAL HIGH (ref 0.44–1.00)
GFR, Estimated: 42 mL/min — ABNORMAL LOW (ref >60)
Glucose, Bld: 88 mg/dL (ref 70–99)
Potassium: 4.2 mmol/L (ref 3.5–5.1)
Sodium: 136 mmol/L (ref 135–145)

## 2024-01-19 LAB — MAGNESIUM: Magnesium: 2 mg/dL (ref 1.7–2.4)

## 2024-01-19 MED ORDER — OXYCODONE HCL 5 MG PO TABS
2.5000 mg | ORAL_TABLET | ORAL | 0 refills | Status: DC | PRN
Start: 2024-01-19 — End: 2024-03-02
  Filled 2024-01-19: qty 10, 2d supply, fill #0

## 2024-01-19 MED ORDER — APIXABAN 5 MG PO TABS
5.0000 mg | ORAL_TABLET | Freq: Two times a day (BID) | ORAL | Status: DC
  Administered 2024-01-19: 5 mg via ORAL
  Filled 2024-01-19: qty 1

## 2024-01-19 MED ORDER — FUROSEMIDE 20 MG PO TABS
20.0000 mg | ORAL_TABLET | Freq: Every day | ORAL | 11 refills | Status: DC | PRN
  Filled 2024-01-19: qty 30, 30d supply, fill #0

## 2024-01-19 MED ORDER — POTASSIUM CHLORIDE CRYS ER 20 MEQ PO TBCR: 20.0000 meq | EXTENDED_RELEASE_TABLET | Freq: Every day | ORAL | 11 refills | Status: DC | PRN

## 2024-01-19 MED ORDER — MIDODRINE HCL 5 MG PO TABS
5.0000 mg | ORAL_TABLET | Freq: Three times a day (TID) | ORAL | 0 refills | Status: DC
  Filled 2024-01-19: qty 90, 30d supply, fill #0

## 2024-01-19 NOTE — TOC Transition Note (Signed)
 Transition of Care [facility]) - Discharge Note   Patient Details  Name: Tara [name] MRN: [id number] Date of Birth: Jun 17, 1946  Transition of Care [facility]) CM/SW Contact:  Benjiman Bras, RN Phone Number: [phone] 01/19/2024, 11:12 AM   Clinical Narrative:     TOC CM spoke to pt and states husband will provide transportation home.   PCP appt scheduled for 5/7 at 11 am. [facility] for resumption of HH.   Final next level of care: Home w Home Health Services Barriers to Discharge: No Barriers Identified   Patient Goals and CMS Choice Patient states their goals for this hospitalization and ongoing recovery are:: wants to get back to her baseline CMS Medicare.gov Compare Post Acute Care list provided to:: Patient Choice offered to / list presented to : Patient      Discharge Placement                       Discharge Plan and Services Additional resources added to the After Visit Summary for     Discharge Planning Services: CM Consult Post Acute Care Choice: Home Health                    HH Arranged: RN, PT, OT [facility] Agency: [facility] Date [facility] Agency Contacted: 01/15/24 Time HH Agency Contacted: 1209 Representative spoke with at [facility] Agency: Randel Buss  Social Drivers of Health (SDOH) Interventions SDOH Screenings   Food Insecurity: No Food Insecurity (01/14/2024)  Housing: Low Risk  (01/14/2024)  Transportation Needs: No Transportation Needs (01/14/2024)  Utilities: Not At Risk (01/14/2024)  Alcohol Screen: Low Risk  (05/01/2021)  Depression (PHQ2-9): Medium Risk (10/29/2023)  Financial Resource Strain: Low Risk  (07/07/2022)   [facility] from [facility]  Physical Activity: Insufficiently Active (05/01/2021)  Social Connections: Moderately Integrated (01/14/2024)  Stress: No Stress Concern Present (07/07/2022)   Received from [facility]  Tobacco Use: Low Risk  (01/14/2024)     Readmission Risk Interventions     06/22/2023    1:15 PM 03/06/2022   12:36 PM  Readmission Risk Prevention Plan  Transportation Screening Complete Complete  PCP or Specialist Appt within 5-7 Days Complete   Home Care Screening Complete   Medication Review (RN CM) Complete   HRI or Home Care Consult  Complete  Social Work Consult for Recovery Care Planning/Counseling  Complete

## 2024-01-19 NOTE — Discharge Instructions (Signed)

## 2024-01-19 NOTE — Progress Notes (Signed)
 Occupational Therapy Treatment Patient Details Name: Tara [name] MRN: [id number] DOB: 1946-01-06 Today's Date: 01/19/2024   History of present illness 78 y/o female admitted 01/14/24 after fall at home with chin laceration and mandibular fx with teeth displacement and small frontotemporal SDH.  PMH positive for DVT 2018, breast cancer, GERD, HLD, CHFw/ EF 40-45%, a fib on Eliquis , meningioma (s/p resection 2024), seizure, h/o aspiration PNA, and CKD 3.   OT comments  Pt overall set up for ADL tasks and modified independent with mobility. Able to ambulate @ 400 ft with VSS. Given recent fall recommend HHOT to reduce risk of falls and maximize independence within the home. Acute OT signing off.       If plan is discharge home, recommend the following:  A lot of help with bathing/dressing/bathroom;Assistance with cooking/housework;Assist for transportation;Help with stairs or ramp for entrance;A little help with walking and/or transfers;Direct supervision/assist for medications management   Equipment Recommendations  None recommended by OT    Recommendations for Other Services      Precautions / Restrictions Precautions Precautions: Fall Precaution/Restrictions Comments: pt reports recent fall "my legs just gave way" previous falls due to imbalance Restrictions Weight Bearing Restrictions Per Provider Order: No       Mobility Bed Mobility Overal bed mobility: Modified Independent       Supine to sit:  (HOB 20)          Transfers Overall transfer level: Modified independent Equipment used: Rolling walker (2 wheels)                     Balance Overall balance assessment: History of Falls                                         ADL either performed or assessed with clinical judgement   ADL Overall ADL's : Needs assistance/impaired Eating/Feeding: Set up;Bed level (with HOB elevated)               Upper Body Dressing :  (with HOB  elevated; assist with lines)                     General ADL Comments: overall set up for ADL tasks. Able to donn socks/shoes then ambulate to the bathroom and complete pericare    Extremity/Trunk Assessment Upper Extremity Assessment Upper Extremity Assessment: Overall WFL for tasks assessed RUE Deficits / Details: bruises noted to R UE LUE Deficits / Details: bruises noted on L UE   Lower Extremity Assessment Lower Extremity Assessment: Defer to PT evaluation        Vision       Perception     Praxis     Communication Communication Communication: No apparent difficulties   Cognition Arousal: Alert Behavior During Therapy: WFL for tasks assessed/performed Cognition: History of cognitive impairments (at baseline)                               Following commands: Intact        Cueing   Cueing Techniques: Verbal cues  Exercises      Shoulder Instructions       General Comments ambulated @ 400 ft    Pertinent Vitals/ Pain       Pain Assessment Pain Assessment: Faces Faces Pain Scale: Hurts a little bit  Pain Location: jaw Pain Descriptors / Indicators: Sore  Home Living                                          Prior Functioning/Environment              Frequency  Min 2X/week        Progress Toward Goals  OT Goals(current goals can now be found in the care plan section)  Progress towards OT goals: Goals met/education completed, patient discharged from OT  Acute Rehab OT Goals Patient Stated Goal: home today OT Goal Formulation: With patient Time For Goal Achievement: 01/29/24 Potential to Achieve Goals: Good ADL Goals Pt Will Perform Upper Body Bathing: with supervision;sitting Pt Will Perform Lower Body Bathing: with supervision;sitting/lateral leans;sit to/from stand Pt Will Perform Lower Body Dressing: with supervision;sitting/lateral leans;sit to/from stand Pt Will Transfer to Toilet: with  supervision;ambulating;bedside commode (with least restrictive AD) Pt Will Perform Toileting - Clothing Manipulation and hygiene: with supervision;sitting/lateral leans;sit to/from stand Additional ADL Goal #1: Patient will demonstrate ability to Independently state 3 fall prevention strategies for increased safety and independence during functional tasks.  Plan      Co-evaluation      Reason for Co-Treatment:  (pt with decreased activity tolerance)          AM-PAC OT "6 Clicks" Daily Activity     Outcome Measure   Help from another person eating meals?: None Help from another person taking care of personal grooming?: A Little Help from another person toileting, which includes using toliet, bedpan, or urinal?: A Little Help from another person bathing (including washing, rinsing, drying)?: A Little Help from another person to put on and taking off regular upper body clothing?: A Little Help from another person to put on and taking off regular lower body clothing?: A Little 6 Click Score: 19    End of Session Equipment Utilized During Treatment: Gait belt;Rolling walker (2 wheels)  OT Visit Diagnosis: Other (comment);History of falling (Z91.81);Repeated falls (R29.6) (decreased activity tolerance)   Activity Tolerance Patient tolerated treatment well (limited by nausea and vomiting)   Patient Left in chair;with call bell/phone within reach   Nurse Communication Mobility status        Time: 1010-1049 OT Time Calculation (min): 39 min  Charges: OT General Charges $OT Visit: 1 Visit OT Treatments $Self Care/Home Management : 38-52 mins  Milburn Aliment, OT/L   Acute OT Clinical Specialist Acute Rehabilitation Services Pager [phone] Office [phone]   [facility] 01/19/2024, 10:55 AM

## 2024-01-19 NOTE — Progress Notes (Signed)
 Trauma/Critical Care Follow Up Note  Subjective:    Overnight Issues:   Objective:  Vital signs for last 24 hours: Temp:  [97.1 F (36.2 C)-98.2 F (36.8 C)] 98.2 F (36.8 C) (04/29 0323) Pulse Rate:  [46-66] 60 (04/29 0800) Resp:  [13-21] 16 (04/29 0800) BP: (86-128)/(44-66) 101/58 (04/29 0800) SpO2:  [91 %-96 %] 93 % (04/29 0800) Weight:  [63.5 kg] 63.5 kg (04/29 0400)  Hemodynamic parameters for last 24 hours:    Intake/Output from previous day: 04/28 0701 - 04/29 0700 In: -  Out: 900 [Urine:900]  Intake/Output this shift: Total I/O In: 240 [P.O.:240] Out: -   Vent settings for last 24 hours:    Physical Exam:  Gen: comfortable, no distress Neuro: follows commands, alert, communicative HEENT: PERRL Neck: supple CV: RRR Pulm: unlabored breathing on RA Abd: soft, NT   , +BM GU: urine clear and yellow, +spontaneous voids Extr: wwp, no edema  Results for orders placed or performed during the hospital encounter of 01/14/24 (from the past 24 hours)  Basic metabolic panel     Status: Abnormal   Collection Time: 01/19/24  2:16 AM  Result Value Ref Range   Sodium 136 135 - 145 mmol/L   Potassium 4.2 3.5 - 5.1 mmol/L   Chloride 103 98 - 111 mmol/L   CO2 24 22 - 32 mmol/L   Glucose, Bld 88 70 - 99 mg/dL   BUN 26 (H) 8 - 23 mg/dL   Creatinine, Ser 1.61 (H) 0.44 - 1.00 mg/dL   Calcium  8.8 (L) 8.9 - 10.3 mg/dL   GFR, Estimated 42 (L) >60 mL/min   Anion gap 9 5 - 15  Magnesium      Status: None   Collection Time: 01/19/24  2:16 AM  Result Value Ref Range   Magnesium  2.0 1.7 - 2.4 mg/dL   *Note: Due to a large number of results and/or encounters for the requested time period, some results have not been displayed. A complete set of results can be found in Results Review.    Assessment & Plan: The plan of care was discussed with the bedside nurse for the day, who is in agreement with this plan and no additional concerns were raised.   Present on Admission:   Fall    LOS: 5 days   Additional comments:I reviewed the patient's new clinical lab test results.   and I reviewed the patients new imaging test results.    GLF   SDH - NSGY c/s, Dr. Larrie Po, s/p Kcentra, repeat CT stable. H/o meningioma removed last year with a history of seizures on keppra  BID at baseline, continue.  TBI therapies Mandible fx of aveolar ridge and teeth - outpatient dentistry follow up for management of this. D/W [facility] 4/25 and okay for f/u with general dentist L sided edema and ecchymosis of neck secondary to above - monitor, D2 diet, neck exam has been stable A fib on eliquis  - okay to resume Eliquis  at discharge per NSGY recs. H/o two falls recently, fall this admission and on 4/20.  CHF, EF 40-45% - HF team managing. Goes into AF brady and BP drop, started midodrine  and this is slightly better, consider increasing as an outpatient. HF team recs for Zio monitor.  CKD 3 GERD- BID protonix , home dose History of meningioma/seizures - s/p resection, continue BID Keppra  HLD H/O breast cancer Elevated LFTs - ?amio related per Dr. Charline Contras last note.  These are quite increased in the last 5 months. Holding amio  per HF recs Prediabetes - hgbA1C in 2024 6.2 FEN - D2 diet, encouraged PO and outside food within the dietary restrictions VTE - okay to resume eliquis , discharging today ID - none needed Dispo - discharge home, HF team to set up Zio heart monitor at discharge  Tara Bamberg, MD Trauma & General Surgery Please use [email or web address] to contact on call provider  01/19/2024  *Care during the described time interval was provided by me. I have reviewed this patient's available data, including medical history, events of note, physical examination and test results as part of my evaluation.

## 2024-01-19 NOTE — Progress Notes (Addendum)
 Advanced Heart Failure Rounding Note  Cardiologist: None  Chief Complaint: Mechanical fall   Patient Profile   78 y/o  AAF with chronic systolic HF==>HFimEF, hx of SVT, CKD stage 3 and persistent a fib. Admitted with fall while on Eliquis , post medication change. Found to be both hypotensive and bradycardic on arrival, felt 2/2 polypharmacy. Fall resulted in subsequent SDH and left mandible fx. Required Dopamine . Now weaned off, hemodynamics now stable. Echo EF 55% and otherwise normal.    Subjective:    Hemodynamics remain stable off Dopamine .   HR upper 50s/low 60s, sinus. BP normotensive.   Feeling better. Denies HA. C/w jaw pain, though improving. Tolerating diet better today. Has been working w/ PT. No orthostatic symptoms.   Objective:    Weight Range: 63.5 kg Body mass index is 29.26 kg/m.   Vital Signs:   Temp:  [97.1 F (36.2 C)-98.2 F (36.8 C)] 98.2 F (36.8 C) (04/29 0323) Pulse Rate:  [46-66] 60 (04/29 0800) Resp:  [13-21] 16 (04/29 0800) BP: (86-128)/(44-66) 101/58 (04/29 0800) SpO2:  [91 %-96 %] 93 % (04/29 0800) Weight:  [63.5 kg] 63.5 kg (04/29 0400) Last BM Date : 01/19/24  Weight change: Filed Weights   01/14/24 1109 01/17/24 0600 01/19/24 0400  Weight: 64 kg 66.5 kg 63.5 kg   Intake/Output:  Intake/Output Summary (Last 24 hours) at 01/19/2024 0829 Last data filed at 01/19/2024 0800 Gross per 24 hour  Intake 240 ml  Output 900 ml  Net -660 ml    Physical Exam    General:  thin, elderly female. Sitting up in bed. No distress.  HEENT: mild ecchymosis left mandible. Otherwise normal Neck: supple. no JVD. appreciated. Cor: PMI nondisplaced. Regular rate & rhythm. No rubs, gallops or murmurs. Lungs: clear Abdomen: soft, nontender, nondistended.  Extremities: no cyanosis, clubbing, rash, edema Neuro: alert & oriented x 3, cranial nerves grossly intact. moves all 4 extremities w/o difficulty. Affect pleasant.  Telemetry   SB/NSR, upper  50s-low 60s personally reviewed   Labs    CBC Recent Labs    01/17/24 0259  WBC 8.0  HGB 11.9*  HCT 37.3  MCV 83.1  PLT 177   Basic Metabolic Panel Recent Labs    86/57/84 0335 01/19/24 0216  NA 134* 136  K 4.2 4.2  CL 104 103  CO2 22 24  GLUCOSE 88 88  BUN 19 26*  CREATININE 1.31* 1.31*  CALCIUM  8.6* 8.8*  MG 2.1 2.0   Liver Function Tests No results for input(s): "AST", "ALT", "ALKPHOS", "BILITOT", "PROT", "ALBUMIN " in the last 72 hours.  BNP: BNP (last 3 results) Recent Labs    08/07/23 1453 09/04/23 1610 12/15/23 1242  BNP 332.4* 102.1* 355.2*   Medications:     Scheduled Medications:  acetaminophen   650 mg Oral Q6H   Chlorhexidine  Gluconate Cloth  6 each Topical Daily   feeding supplement  1 Container Oral TID BM   hydrOXYzine   10 mg Oral QHS   lacosamide   50 mg Oral BID   levETIRAcetam   500 mg Oral BID   midodrine   5 mg Oral TID WC   pantoprazole   40 mg Oral BID   polyethylene glycol  17 g Oral BID   senna-docusate  2 tablet Oral BID   sertraline   25 mg Oral Daily   Infusions:   PRN Medications: clonazePAM , mouth rinse, oxyCODONE , oxyCODONE   Assessment/Plan   Fall >> SDH L mandible fx - Suspect 2/2 polypharmacy (spiro and 2 different  sleeping pills). SDH 2/2 fall on Eliquis . - Imaging revealed a frontotemporal SDH and nondisplaced fracture of L mandible - Repeat head CT without changes.  - Trauma and neurosurgery teams following - per Neurosurgery, ok to resume Eliquis .   2. Bradycardia/Hypotension  - Noted on arrival - hypotensive; possible related to fall - SDH unlikely to be large enough to be related - holding amio, not on any other nodal blockers - Dopamine  started 4/25 and turned off 4/27. Maintaining stable hemodynamics - no further profound/symptomatic bradycardia. BP stable  - continue midodrine  5 mg tid. Keep off amio  - Will arrange Zio prior to d/c   3. Chronic HF with improved EF - Echo 4/26 with normal EF and  function (though on low dose dopamine ) - NYHA class II. Euvolemic on exam  - Low BP and CKD has limited GDMT.  - Hold diuresis and GDMT - SGLT2i discontinued on admit due to pyuria   4. CKD stage 3 - Baseline Scr 1.8 - SCr stable 1.3 today  - off SGLT2i at this time with pyuria, no symptoms of UTI   5. Hx short RP SVT - No reoccurrences. Continue amiodarone    6.Paroxysmal a fib - paroxysmal.  S/p DC-CV 11/24. She remains in NSR on amiodarone . - Eliquis  held with SDH.Per NS, ok to resume at d/c  - Holding amiodarone  as above    7. Elevated LFTs - Have been following OP. ?d/t amio vs CHF - Hold statin with LFTs elevated at this time as well as muscle weakness, though CK normal - Recently started for LDL >100.  - Refer to lipid clinic OP   8. Pyuria: - No symptoms, agree with holding further treatment - SGLT2i discontinued.   Ok for d/c home from cardiac standpoint. We will arrange for placement of a Zio for further monitoring of HR/rhythm prior to discharge. Will arrange post hospital f/u and will place appt info in AVS.   Discharge home on Eliquis  5 mg bid and Midodrine  5 mg tid. Keep off amio and Farxiga .  Ok for Lasix  20 mg PRN for weight gain > 3 lb in 24 hr or > 5 lb in 1 wk.       Length of Stay: [address] PA-C  01/19/2024, 8:29 AM  Advanced Heart Failure Team Pager [phone] (M-F; 7a - 5p)  Please contact [facility] for night-coverage after hours (5p -7a ) and weekends on [email or web address]

## 2024-01-19 NOTE — Discharge Summary (Signed)
 Physician Discharge Summary  Patient ID: CARALEIGH EAKLE MRN: [id number] DOB/AGE: Jun 12, 1946 78 y.o.  Admit date: 01/14/2024 Discharge date: 01/19/2024  Discharge Diagnoses Ground level fall SDH Mandible fracture of alveolar ridge and teeth Left sided edema and ecchymosis of neck  Atrial fibrillation on Eliquis , PTA Chronic HFrEF CKD stage III GERD Hx of meningiomas/seizures HLD Hx of breast cancer Elevated LFTs Prediabetes  Consultants Heart Failure Neurosurgery Neurology ENT Palliative care  Procedures None   HPI: Patient is a pleasant 78 yo black female with a history of DVT in 2018, breast cancer, GERD, HLD, Chronic systolic CHF with EF of 40-45%, a fib on Eliquis , meningioma (s/p resection by Dr. Michale Age in 2024), h/o aspiration PNA, and CKD stage 3 who states she saw Dr. Mitzie Anda about 2 weeks ago and had some of her medications adjusted. Since that time she has felt "weak in the knees." She fell on Sunday apparently. She graduated from cardiac rehab just yesterday. Unfortunately this morning she stood up to go to the bathroom and fell. She denies LOC, lightheadedness, dizziness, etc. She states her legs just felt weak and gave out on her. She hit her head on her chin. She has a 4cm laceration along the left jaw line. She is having a lot of pain in her jaw from a mandible fracture with some displacement of her teeth on the lower left side. She admits to some pain in both of her knees, but otherwise no other pain. She denies have any dysphagia or dysphonia currently, but is having some edema of the left side of her neck. She denies any pain in her head. On work up, she has been found to have a small frontotemporal SDH as well as nondisplaced fx of the anterior left mandible involving the alveolar ridges of the lateral incisor and canine teeth with a fx of the left lateral incisor tooth. NSGY has been consulted. ENT was initially called, but given the location of mandible fx, this  would need dentistry, but we don't have dentistry on call here. B knee films are negative. Admitted to trauma ICU.   Hospital Course: Follow up [facility] 4/25 showed stable SDH. Heart failure team consulted for medication management given increased weakness and several falls since outpatient medication adjustments. Palliative care was consulted to help establish GOC during admission as well and patient wanted to remain full scope of care. Therapies assessed patient throughout admission and ultimately she was recommended for home with [facility] OT. On 01/19/24 patient was discharged in stable condition with follow up as outlined below.   I was not directly involved in the care of this patient, information in summary taken from chart review.   Allergies as of 01/19/2024       Reactions   Aspirin  Other (See Comments)   Reports GI bleed-   Lisinopril  Cough   Sudafed [pseudoephedrine Hcl] Other (See Comments)   Pt reports she had drainage in throat that made her throat hurt.         Medication List     PAUSE taking these medications    amiodarone  200 MG tablet Wait to take this until your doctor or other care provider tells you to start again. Commonly known as: PACERONE  Take 1 tablet (200 mg total) by mouth daily.   rosuvastatin  10 MG tablet Wait to take this until your doctor or other care provider tells you to start again. Commonly known as: CRESTOR  Take 1 tablet (10 mg total) by mouth daily.   spironolactone   25 MG tablet Wait to take this until your doctor or other care provider tells you to start again. Commonly known as: ALDACTONE  Take 0.5 tablets (12.5 mg total) by mouth daily.       STOP taking these medications    Farxiga  10 MG Tabs tablet Generic drug: dapagliflozin  propanediol       TAKE these medications    acetaminophen  500 MG tablet Commonly known as: TYLENOL  Take 1,000 mg by mouth every 6 (six) hours as needed for moderate pain (pain score 4-6) or mild pain (pain score  1-3).   apixaban  5 MG Tabs tablet Commonly known as: Eliquis  Take 1 tablet (5 mg total) by mouth 2 (two) times daily.   bisacodyl  5 MG EC tablet Commonly known as: bisacodyl  One tablet every 3 days, if needed, for constipation What changed:  how much to take how to take this when to take this reasons to take this additional instructions   clonazePAM  0.5 MG tablet Commonly known as: KlonoPIN  Take 1 tablet (0.5 mg total) by mouth at bedtime.   Colace 100 MG capsule Generic drug: docusate sodium  Take 1 capsule (100 mg total) by mouth 2 (two) times daily.   fluticasone  50 MCG/ACT nasal spray Commonly known as: FLONASE  Place 2 sprays into both nostrils daily. What changed:  when to take this reasons to take this   furosemide  20 MG tablet Commonly known as: LASIX  Take 1 tablet (20 mg total) by mouth daily as needed (for wt gain > 3 lb in 24h or > 5 lb in 1wk). What changed:  when to take this reasons to take this   hydrocortisone  2.5 % rectal cream Commonly known as: ANUSOL -HC APPLY RECTALLY TWICE DAILY. What changed: See the new instructions.   hydrOXYzine  10 MG tablet Commonly known as: ATARAX  Take one tablet at bedtime for itching What changed:  how much to take how to take this when to take this additional instructions   lacosamide  50 MG Tabs tablet Commonly known as: VIMPAT  Take 1 tablet (50 mg total) by mouth 2 (two) times daily.   levETIRAcetam  500 MG tablet Commonly known as: KEPPRA  Take 1 tablet (500 mg total) by mouth 2 (two) times daily.   midodrine  5 MG tablet Commonly known as: PROAMATINE  Take 1 tablet (5 mg total) by mouth 3 (three) times daily with meals.   montelukast  10 MG tablet Commonly known as: SINGULAIR  Take 1 tablet (10 mg total) by mouth at bedtime.   oxyCODONE  5 MG immediate release tablet Commonly known as: Oxy IR/ROXICODONE  Take 0.5-1 tablets (2.5-5 mg total) by mouth every 4 (four) hours as needed for severe pain (pain score  7-10).   pantoprazole  40 MG tablet Commonly known as: PROTONIX  TAKE ONE TABLET BY MOUTH TWICE DAILY   polyethylene glycol 17 g packet Commonly known as: MiraLax  Take one packet mira lax once daily in 8 ounces water  What changed:  how much to take how to take this when to take this reasons to take this additional instructions   potassium chloride  SA 20 MEQ tablet Commonly known as: KLOR-CON  M Take 1 tablet (20 mEq total) by mouth daily as needed (when take furosemide ). What changed:  when to take this reasons to take this   sertraline  25 MG tablet Commonly known as: ZOLOFT  TAKE 1 TABLET BY MOUTH ONCE A DAY.          Follow-up Information     Towanda Fret, MD Follow up.   Specialty: Family Medicine Why:  follow up appt scheduled for 01/27/2024 at 11 am Contact information: [address] [place] [address] [phone]         [facility] Follow up.   Specialty: Cardiology Why: 02/03/24 @ 11:30 am  Hospital follow up in the Advanced Heart Failure Clinic at [facility], Entrance C (Dr. Charline Contras office) Contact information: [address] [place]   [phone]        Care, [facility] Follow up.   Specialty: Home Health Services Why: Home Health RN, Physical Therapy, [place] therapy-will arrange visits Contact information: [address] [place] [address] [phone]                 Signed: Annetta Killian , [facility] 01/19/2024, 3:47 PM Please see Amion for pager number during day hours 7:00am-4:30pm

## 2024-01-20 ENCOUNTER — Encounter (HOSPITAL_COMMUNITY): Payer: Medicare Other

## 2024-01-21 ENCOUNTER — Ambulatory Visit: Payer: Medicare Other | Admitting: Neurology

## 2024-01-21 ENCOUNTER — Encounter: Payer: Self-pay | Admitting: Neurology

## 2024-01-21 VITALS — BP 108/62 | HR 81 | Resp 16 | Ht <= 58 in

## 2024-01-21 DIAGNOSIS — D329 Benign neoplasm of meninges, unspecified: Secondary | ICD-10-CM | POA: Diagnosis not present

## 2024-01-21 DIAGNOSIS — G40909 Epilepsy, unspecified, not intractable, without status epilepticus: Secondary | ICD-10-CM | POA: Diagnosis not present

## 2024-01-21 NOTE — Progress Notes (Signed)
 [facility]  PATIENT: Tara [name] DOB: [birth year]  REQUESTING CLINICIAN: Towanda Fret, MD HISTORY FROM: Patient and husband  REASON FOR VISIT: Seizure disorder    HISTORICAL  CHIEF COMPLAINT:  Chief Complaint  Patient presents with   Follow-up    Rm14, daughter and husband present, SZ: last sz was  2 yrs ago doing well. TREMOR: more frequent and widespread, mentioned frequent falls (falls screening completed) and recent hospital stay paused pacerone , crestor , aldactone  at hospital and started midodrine , and oxycodone    INTERVAL HISTORY 01/21/2024:  Patient presents today for follow-up, last visit was in October, at that time we decreased the Vimpat  to 50 mg twice daily and continue patient on Keppra  500 mg twice daily.  She was doing well, was doing PT, but family tells me that she was having falls and complaining of dizziness.  It seemed that there was a misunderstanding in her Vimpat , she was taking 50 mg twice daily and her husband was giving her extra 50 mg daily.  On April 24, she did have a fall and fractured her left jaw. There are no reports of seizure or seizure like activity. Her tremors are controlled, she is not complaining of worsening tremors except when she was admitted and medicated in the hospital.    INTERVAL HISTORY 07/02/2023 Patient presents today for follow-up, she is accompanied by husband and daughter. Last visit was in February, at that time I have decreased her Keppra  from 1000 to 750 mg twice daily and Vimpat  from 200 to 100 mg twice daily.  Since then she has not had any seizure or seizure like activity.  She was admitted to the hospital twice over the summer due to pneumonia and second time regarding fluid overload and worsening renal function.  At that time her Keppra  was decreased to 500 mg twice daily.  Again no seizure or seizure activity.  Overall she is doing well. In term of bradykinesia and resting tremor, I obtained a DaTscan   which was negative for parkinsonism.   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  Patient have a long standing history of meningioma, in June she presented with lethargy and was found to have new vasogenic edema in the adjacent frontal lobe.  The meningioma was resected.  Husband stated after resection of the meningioma she had seizures.  She had a total of 3 seizures and they are all described as generalized convulsion.  She is on Vimpat  200 mg twice daily and Keppra  750 mg twice daily but husband reported lethargy with the medicine and also reports shaking.  Husband stated whenever he gave him the medication he noted that patient shakes all over.  Currently he is on a wheelchair she is able to stand but has difficulty with ambulation.She is doing home PT and OT.   Handedness: Right-handed  Onset: In June after resection of a meningioma  Seizure Type: Described as generalized convulsion  Current frequency: Total of 3 seizures  Any injuries from seizures: Denies  Seizure risk factors: Meningioma s/p resection  Previous ASMs: Levetiracetam  and lacosamide   Currenty ASMs: Levetiracetam  500 mg twice daily and lacosamide  50 mg twice daily  ASMs side effects: Lethargy  Brain Images: Postsurgical change  Previous EEGs: Diffuse slowing   OTHER MEDICAL CONDITIONS: Meningioma s/p resection seizure disorder,  REVIEW OF SYSTEMS: Full 14 system review of systems  performed and negative with exception of: As noted in the HPI   ALLERGIES: Allergies  Allergen Reactions   Aspirin  Other (See Comments)    Reports GI bleed-   Lisinopril  Cough   Sudafed [Pseudoephedrine Hcl] Other (See Comments)    Pt reports she had drainage in throat that made her throat hurt.     HOME MEDICATIONS: Outpatient Medications Prior to Visit  Medication Sig  Dispense Refill   acetaminophen  (TYLENOL ) 500 MG tablet Take 1,000 mg by mouth every 6 (six) hours as needed for moderate pain (pain score 4-6) or mild pain (pain score 1-3).     apixaban  (ELIQUIS ) 5 MG TABS tablet Take 1 tablet (5 mg total) by mouth 2 (two) times daily. 60 tablet 6   bisacodyl  5 MG EC tablet One tablet every 3 days, if needed, for constipation (Patient taking differently: Take 5 mg by mouth daily as needed for moderate constipation or mild constipation.) 30 tablet 1   clonazePAM  (KLONOPIN ) 0.5 MG tablet Take 1 tablet (0.5 mg total) by mouth at bedtime. 30 tablet 5   docusate sodium  (COLACE) 100 MG capsule Take 1 capsule (100 mg total) by mouth 2 (two) times daily.     fluticasone  (FLONASE ) 50 MCG/ACT nasal spray Place 2 sprays into both nostrils daily. (Patient taking differently: Place 2 sprays into both nostrils daily as needed for rhinitis or allergies.) 48 g 3   furosemide  (LASIX ) 20 MG tablet Take 1 tablet (20 mg total) by mouth daily as needed (for wt gain > 3 lb in 24h or > 5 lb in 1wk). 30 tablet 11   hydrocortisone  (ANUSOL -HC) 2.5 % rectal cream APPLY RECTALLY TWICE DAILY. (Patient taking differently: Place 1 Application rectally 2 (two) times daily as needed for hemorrhoids or anal itching.) 30 g 3   hydrOXYzine  (ATARAX ) 10 MG tablet Take one tablet at bedtime for itching (Patient taking differently: Take 10 mg by mouth at bedtime.) 30 tablet 3   levETIRAcetam  (KEPPRA ) 500 MG tablet Take 1 tablet (500 mg total) by mouth 2 (two) times daily. 180 tablet 3   midodrine  (PROAMATINE ) 5 MG tablet Take 1 tablet (5 mg total) by mouth 3 (three) times daily with meals. 90 tablet 0   montelukast  (SINGULAIR ) 10 MG tablet Take 1 tablet (10 mg total) by mouth at bedtime. 90 tablet 3   oxyCODONE  (OXY IR/ROXICODONE ) 5 MG immediate release tablet Take 0.5-1 tablets (2.5-5 mg total) by mouth every 4 (four) hours as needed for severe pain (pain score 7-10). 10 tablet 0   pantoprazole  (PROTONIX )  40 MG tablet TAKE ONE TABLET BY MOUTH TWICE DAILY 60 tablet 0   polyethylene glycol (MIRALAX ) 17 g packet Take one packet mira lax once daily in 8 ounces water  (Patient taking differently: Take 17 g by mouth daily as needed for moderate constipation, mild constipation or severe constipation.) 28 each 5   potassium chloride  SA (KLOR-CON  M) 20 MEQ tablet Take 1 tablet (20 mEq total) by mouth daily as needed (when take furosemide ). 30 tablet 11   sertraline  (ZOLOFT ) 25 MG tablet TAKE 1 TABLET BY MOUTH ONCE A DAY. 30 tablet 0   lacosamide  (VIMPAT ) 50 MG TABS tablet Take 1 tablet (50 mg total) by mouth 2 (two) times daily. 180 tablet 2   amiodarone  (PACERONE ) 200 MG tablet Take 1 tablet (200 mg total) by mouth daily. (Patient not taking: Reported on 01/21/2024) 30 tablet 6   rosuvastatin  (CRESTOR ) 10 MG tablet Take 1 tablet (10 mg total)  by mouth daily. (Patient not taking: Reported on 01/21/2024) 90 tablet 3   spironolactone  (ALDACTONE ) 25 MG tablet Take 0.5 tablets (12.5 mg total) by mouth daily. (Patient not taking: Reported on 01/21/2024) 45 tablet 3   No facility-administered medications prior to visit.    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   Angio-edema    Anxiety    Breast cancer (HCC) 2008   right - s/p lumpectomy->chemo, radiation   Depression    Dysrhythmia    hx SVT   Family history of colon cancer    Family history of prostate cancer    GERD (gastroesophageal reflux disease)    H/O: hysterectomy    Hiatal hernia    History of cancer chemotherapy    History of radiation therapy    Hyperlipidemia    Hypertension 01/25/2018   Nonischemic cardiomyopathy (HCC)    Personal history of radiation therapy 01/05/209   SVT (supraventricular tachycardia) (HCC)    short RP SVT documented 5/14   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HCC)    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)    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    PAST SURGICAL HISTORY: Past Surgical History:  Procedure Laterality Date   ABDOMINAL HYSTERECTOMY  1994   fibroids,    BIOPSY  06/19/2016   Procedure: BIOPSY;  Surgeon: Suzette Espy, MD;  Location: [facility];  Service: Endoscopy;;  gastric duodenum   BREAST BIOPSY Right 12/16/2006   malignant   BREAST EXCISIONAL BIOPSY Right 2018   benign lumpectomy   BREAST LUMPECTOMY Right 02/2007   BREAST LUMPECTOMY WITH RADIOACTIVE SEED LOCALIZATION Right 07/29/2017   Procedure: RIGHT BREAST LUMPECTOMY WITH RADIOACTIVE SEED LOCALIZATION;  Surgeon: Boyce Byes, MD;  Location: [facility] OR;  Service: General;  Laterality: Right;   BREAST SURGERY Right 2008   lumpectomy, cancer   CARDIAC CATHETERIZATION     CARDIOVERSION N/A 08/06/2023   Procedure: CARDIOVERSION (CATH LAB);  Surgeon: Darlis Eisenmenger, MD;  Location: [facility] INVASIVE CV LAB;  Service: Cardiovascular;  Laterality: N/A;   CHOLECYSTECTOMY  1999   COLONOSCOPY  2008   Dr. Nolene Baumgarten: multiple polyps. Path not available at time of visit.    COLONOSCOPY WITH PROPOFOL  N/A 04/25/2014   Dr. Nolene Baumgarten: Multiple tubular adenomas removed. Diverticulosis. Moderate internal hemorrhoids. Next colonoscopy planned for August 2018.   CRANIOTOMY Bilateral 03/24/2022   Procedure: Bifrontal craniotomy for resection of meningioma;  Surgeon: Audie Bleacher, MD;  Location: [facility] OR;  Service: Neurosurgery;  Laterality: Bilateral;   ESOPHAGOGASTRODUODENOSCOPY (EGD) WITH PROPOFOL  N/A 06/19/2016   Procedure: ESOPHAGOGASTRODUODENOSCOPY (EGD) WITH PROPOFOL ;  Surgeon: Suzette Espy, MD;  Location: [facility];  Service: Endoscopy;  Laterality: N/A;   MASTECTOMY W/ SENTINEL NODE BIOPSY Right 06/09/2018   Procedure: RIGHT TOTAL MASTECTOMY WITH SENTINEL LYMPH NODE BIOPSY;  Surgeon: Boyce Byes, MD;  Location: [facility] OR;  Service: General;  Laterality: Right;   POLYPECTOMY N/A 04/25/2014   Procedure:  POLYPECTOMY;  Surgeon: Alyce Jubilee, MD;  Location: [facility];  Service: Endoscopy;  Laterality: N/A;  Ascending and Decending Colon x3 , Transverse colon x2, rectal   PORTACATH PLACEMENT N/A 06/09/2018   Procedure: INSERTION PORT-A-CATH;  Surgeon: Boyce Byes, MD;  Location: [facility] OR;  Service: General;  Laterality: N/A;   RIGHT/LEFT HEART CATH AND CORONARY ANGIOGRAPHY N/A 07/10/2017  Procedure: RIGHT/LEFT HEART CATH AND CORONARY ANGIOGRAPHY;  Surgeon: Darlis Eisenmenger, MD;  Location: [facility] INVASIVE CV LAB;  Service: Cardiovascular;  Laterality: N/A;    FAMILY HISTORY: Family History  Problem Relation Age of Onset   Dementia Mother    Stroke Mother 10       left hemiparesis   Colon cancer Father 3       died at age 78   Breast cancer Sister    Hypertension Sister    Heart disease Sister    Cancer Sister        unknown form   Cancer Paternal Aunt        NOS   Other Paternal Aunt        old age   Lung cancer Paternal Uncle    Other Paternal Uncle        lightning strike   Other Paternal Uncle        hit by train   Cancer Cousin        NOS pat first cousin   Cancer Cousin        NOS pat first cousin   Prostate cancer Cousin        pat first cousin   Cancer Brother 36       prostate    SOCIAL HISTORY: Social History   Socioeconomic History   Marital status: Married    Spouse name: Not on file   Number of children: 2   Years of education: Not on file   Highest education level: Not on file  Occupational History   Occupation: disabled     Employer: UNEMPLOYED  Tobacco Use   Smoking status: Never   Smokeless tobacco: Never  Vaping Use   Vaping status: Never Used  Substance and Sexual Activity   Alcohol use: No   Drug use: No   Sexual activity: Not on file  Other Topics Concern   Not on file  Social History Narrative   Lives in [place] with family.  Does not routinely exercise. Right handed   Caffeine-none   Social Drivers of Health   Financial Resource  Strain: Low Risk  (07/07/2022)   Received from [facility], [facility]   Overall Financial Resource Strain (CARDIA)    Difficulty of Paying Living Expenses: Not hard at all  Food Insecurity: No Food Insecurity (01/14/2024)   Hunger Vital Sign    Worried About Running Out of Food in the Last Year: Never true    Ran Out of Food in the Last Year: Never true  Transportation Needs: No Transportation Needs (01/14/2024)   PRAPARE - Administrator, Civil Service (Medical): No    Lack of Transportation (Non-Medical): No  Physical Activity: Insufficiently Active (05/01/2021)   Exercise Vital Sign    Days of Exercise per Week: 3 days    Minutes of Exercise per Session: 30 min  Stress: No Stress Concern Present (07/07/2022)   Received from [facility], [facility] - Occupational Stress Questionnaire    Feeling of Stress : Not at all  Social Connections: Moderately Integrated (01/14/2024)   Social Connection and Isolation Panel [NHANES]    Frequency of Communication with Friends and Family: More than three times a week    Frequency of Social Gatherings with Friends and Family: More than three times a week    Attends Religious Services: More than 4 times per year    Active Member of Clubs or  Organizations: No    Attends Banker Meetings: Never    Marital Status: Married  Catering manager Violence: Not At Risk (01/14/2024)   Humiliation, Afraid, Rape, and Kick questionnaire    Fear of Current or Ex-Partner: No    Emotionally Abused: No    Physically Abused: No    Sexually Abused: No    PHYSICAL EXAM  GENERAL EXAM/CONSTITUTIONAL: Vitals:  Vitals:   01/21/24 1338  BP: 108/62  Pulse: 81  Resp: 16  Height: 4\' 9"  (1.448 m)   Body mass index is 30.29 kg/m. Wt Readings from Last 3 Encounters:  01/19/24 139 lb 15.9 oz (63.5 kg)  01/06/24 141 lb 8.6 oz (64.2 kg)  12/15/23 144 lb (65.3 kg)   Patient is in no distress;  well developed, nourished and groomed; neck is supple, sitting in her wheelchair  MUSCULOSKELETAL: Gait, strength, tone, movements noted in Neurologic exam below  NEUROLOGIC: MENTAL STATUS:     05/01/2021   11:27 AM  MMSE - Mini Mental State Exam  Not completed: Unable to complete   awake, alert, oriented to person, place and time recent and remote memory intact normal attention and concentration language fluent, comprehension intact, naming intact fund of knowledge appropriate  CRANIAL NERVE:  2nd, 3rd, 4th, 6th - Visual fields full to confrontation, extraocular muscles intact, no nystagmus 5th - facial sensation symmetric 7th - facial strength symmetric 8th - hearing intact 9th - palate elevates symmetrically, uvula midline 11th - shoulder shrug symmetric 12th - tongue protrusion midline  MOTOR:  normal bulk and tone, full strength in the BUE, BLE. She has bradykinesia   SENSORY:  normal and symmetric to light touch  COORDINATION:  finger-nose-finger, fine finger movements normal. No resting tremors appreciated today.   GAIT/STATION:  Deferred    DIAGNOSTIC DATA (LABS, IMAGING, TESTING) - I reviewed patient records, labs, notes, testing and imaging myself where available.  Lab Results  Component Value Date   WBC 8.0 01/17/2024   HGB 11.9 (L) 01/17/2024   HCT 37.3 01/17/2024   MCV 83.1 01/17/2024   PLT 177 01/17/2024      Component Value Date/Time   NA 136 01/19/2024 0216   NA 146 (H) 07/01/2023 1530   NA 143 10/12/2014 1001   K 4.2 01/19/2024 0216   K 3.9 10/12/2014 1001   CL 103 01/19/2024 0216   CO2 24 01/19/2024 0216   CO2 25 10/12/2014 1001   GLUCOSE 88 01/19/2024 0216   GLUCOSE 105 10/12/2014 1001   BUN 26 (H) 01/19/2024 0216   BUN 24 07/01/2023 1530   BUN 13.9 10/12/2014 1001   CREATININE 1.31 (H) 01/19/2024 0216   CREATININE 1.90 (H) 07/17/2023 1356   CREATININE 1.0 10/12/2014 1001   CALCIUM  8.8 (L) 01/19/2024 0216   CALCIUM  8.6  10/12/2014 1001   PROT 7.0 01/14/2024 1151   PROT 7.0 07/01/2023 1530   PROT 7.4 10/12/2014 1001   ALBUMIN  3.0 (L) 01/14/2024 1151   ALBUMIN  3.4 (L) 07/01/2023 1530   ALBUMIN  3.5 10/12/2014 1001   AST 237 (H) 01/14/2024 1151   AST 10 (L) 11/12/2018 1044   AST 33 10/12/2014 1001   ALT 248 (H) 01/14/2024 1151   ALT 6 11/12/2018 1044   ALT 27 10/12/2014 1001   ALKPHOS 104 01/14/2024 1151   ALKPHOS 102 10/12/2014 1001   BILITOT 0.3 01/14/2024 1151   BILITOT 0.6 07/01/2023 1530   BILITOT <0.2 (L) 11/12/2018 1044   BILITOT 0.25 10/12/2014 1001   GFRNONAA 42 (  L) 01/19/2024 0216   GFRNONAA 35 (L) 03/22/2020 1358   GFRAA 43 (L) 08/07/2020 1013   GFRAA 40 (L) 03/22/2020 1358   Lab Results  Component Value Date   CHOL 255 (H) 12/15/2023   HDL 75 12/15/2023   LDLCALC 169 (H) 12/15/2023   TRIG 55 12/15/2023   Lab Results  Component Value Date   HGBA1C 6.2 (H) 06/03/2023   Lab Results  Component Value Date   VITAMINB12 356 04/11/2022   Lab Results  Component Value Date   TSH 3.170 07/17/2023    EEG 05/15/22 This study is suggestive moderate diffuse encephalopathy, nonspecific etiology.  No seizures or epileptiform discharges were seen throughout the recording.    MRI Brain 10/17/2022 1. 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 2. Diffuse pachymeningeal thickening and enhancement over both cerebral hemispheres, more prominent over the frontal lobes at the midline measuring up to 7 mm in thickness, favored to reflect evolving postoperative change. Recommend follow-up in 3-6 months to ensure stability.   MRI Brain 03/15/2022 Long-standing planum sphenoidale meningioma with slow growth. Current dimensions are 3 x 3 x 2 cm. Possibly related to the history of seizure, there is new vasogenic edema in the adjacent left frontal  lobe.  DaTscan  12/04/2022 Ioflupane scan within normal limits. No reduced radiotracer activity in basal ganglia to suggest Parkinson's syndrome pathology.   ASSESSMENT AND PLAN  78 y.o. year old female  with meningioma s/p resection in June, seizure disorder, heart disease who is presenting for for follow-up for seizure.  Currently she is on Keppra  500 mg twice daily, and Vimpat  50 mg twice daily.  Denies any seizure or seizure like activity.  Plan will be to continue patient on Keppra  500 mg twice daily, no additional seizures or seizure like activity.  Unfortunately she did have a fall on April 24 and fractured her jaw.  Plan will be to discontinue Vimpat , continue patient on Keppra  500 mg twice daily and I will see her in 6 months for follow-up or sooner if worse.    1. Seizure disorder (HCC)   2. Meningioma [facility])     Patient Instructions  Discontinue Vimpat   Continue with Keppra  500 mg twice daily  Continue your other medications  Continue to follow up with your doctors  Return in 6 months or sooner if worse   Per [place]  DMV statutes, patients with seizures are not allowed to drive until they have been seizure-free for six months.  Other recommendations include using caution when using heavy equipment or power tools. Avoid working on ladders or at heights. Take showers instead of baths.  Do not swim alone.  Ensure the water  temperature is not too high on the home water  heater. Do not go swimming alone. Do not lock yourself in a room alone (i.e. bathroom). When caring for infants or small children, sit down when holding, feeding, or changing them to minimize risk of injury to the child in the event you have a seizure. Maintain good sleep hygiene. Avoid alcohol.  Also recommend adequate sleep, hydration, good diet and minimize stress.   During the Seizure  - First, ensure adequate ventilation and place patients on the floor on their left side  Loosen clothing around the neck and  ensure the airway is patent. If the patient is clenching the teeth, do not force the mouth open with any object as  this can cause severe damage - Remove all items from the surrounding that can be hazardous. The patient may be oblivious to what's happening and may not even know what he or she is doing. If the patient is confused and wandering, either gently guide him/her away and block access to outside areas - Reassure the individual and be comforting - Call 911. In most cases, the seizure ends before EMS arrives. However, there are cases when seizures may last over 3 to 5 minutes. Or the individual may have developed breathing difficulties or severe injuries. If a pregnant patient or a person with diabetes develops a seizure, it is prudent to call an ambulance. - Finally, if the patient does not regain full consciousness, then call EMS. Most patients will remain confused for about 45 to 90 minutes after a seizure, so you must use judgment in calling for help. - Avoid restraints but make sure the patient is in a bed with padded side rails - Place the individual in a lateral position with the neck slightly flexed; this will help the saliva drain from the mouth and prevent the tongue from falling backward - Remove all nearby furniture and other hazards from the area - Provide verbal assurance as the individual is regaining consciousness - Provide the patient with privacy if possible - Call for help and start treatment as ordered by the caregiver   After the Seizure (Postictal Stage)  After a seizure, most patients experience confusion, fatigue, muscle pain and/or a headache. Thus, one should permit the individual to sleep. For the next few days, reassurance is essential. Being calm and helping reorient the person is also of importance.  Most seizures are painless and end spontaneously. Seizures are not harmful to others but can lead to complications such as stress on the lungs, brain and the heart.  Individuals with prior lung problems may develop labored breathing and respiratory distress.     No orders of the defined types were placed in this encounter.   No orders of the defined types were placed in this encounter.   Return in about 6 months (around 07/23/2024).    Cassandra Cleveland, MD 01/21/2024, 2:12 PM  [facility] [address] [place], [place] [address] [phone]

## 2024-01-21 NOTE — Patient Instructions (Signed)
 Discontinue Vimpat   Continue with Keppra  500 mg twice daily  Continue your other medications  Continue to follow up with your doctors  Return in 6 months or sooner if worse

## 2024-01-22 ENCOUNTER — Ambulatory Visit (INDEPENDENT_AMBULATORY_CARE_PROVIDER_SITE_OTHER): Admitting: Family Medicine

## 2024-01-22 ENCOUNTER — Encounter: Payer: Self-pay | Admitting: Family Medicine

## 2024-01-22 VITALS — BP 114/66 | HR 63 | Resp 16 | Ht <= 58 in | Wt 141.1 lb

## 2024-01-22 DIAGNOSIS — W19XXXD Unspecified fall, subsequent encounter: Secondary | ICD-10-CM | POA: Diagnosis not present

## 2024-01-22 DIAGNOSIS — Z09 Encounter for follow-up examination after completed treatment for conditions other than malignant neoplasm: Secondary | ICD-10-CM | POA: Diagnosis not present

## 2024-01-22 DIAGNOSIS — M25561 Pain in right knee: Secondary | ICD-10-CM | POA: Diagnosis not present

## 2024-01-22 DIAGNOSIS — M25562 Pain in left knee: Secondary | ICD-10-CM

## 2024-01-22 NOTE — Patient Instructions (Addendum)
 F/U for suture removal as already scheduled  01/27/2024  F/U with MD in 12 weeks, call if you need me sooner  Non fasting CBc, chem 7 and EGGFR and Magnesium  next week same day as appt for suture removal, plan to have lab work before sutures removed , so come around 10 30 to 10 :45 for lab  You are referred to PT twice weekly due to recent fall with bilateral knee pain   Thanks for choosing [facility], we consider it a privelige to serve you.

## 2024-01-25 ENCOUNTER — Other Ambulatory Visit: Payer: Self-pay | Admitting: Family Medicine

## 2024-01-25 ENCOUNTER — Encounter (HOSPITAL_COMMUNITY): Payer: Medicare Other

## 2024-01-25 DIAGNOSIS — Z1231 Encounter for screening mammogram for malignant neoplasm of breast: Secondary | ICD-10-CM

## 2024-01-26 ENCOUNTER — Other Ambulatory Visit: Payer: Self-pay | Admitting: Family Medicine

## 2024-01-26 ENCOUNTER — Encounter (HOSPITAL_COMMUNITY): Payer: Self-pay

## 2024-01-26 ENCOUNTER — Other Ambulatory Visit: Payer: Self-pay | Admitting: Neurology

## 2024-01-26 NOTE — Telephone Encounter (Signed)
 Requested Prescriptions   Pending Prescriptions Disp Refills   lacosamide  (VIMPAT ) 50 MG TABS tablet [Pharmacy Med Name: lacosamide  50 mg tablet] 180 tablet 2    Sig: Take 1 tablet (50 mg total) by mouth 2 (two) times daily.   Last seen 01/21/24, next appt 08/22/24 Last visit note stated: Patient Instructions  Discontinue Vimpat 

## 2024-01-26 NOTE — Progress Notes (Signed)
 [facility] Liaison Note  01/26/2024  KALIMAH BRANNICK 25-Jun-1946 [id number]  Location: RN Hospital Liaison screened the patient remotely at [facility].  Insurance: [facility] Advantage   LANADIA ESSEN is a 78 y.o. female who is a Primary Care Patient of Rodolph Clap, Cain Castillo, MD [facility]. The patient was screened for readmission hospitalization with noted high risk score for unplanned readmission risk with 1 IP in 6 months.  The patient was assessed for potential Care Management service needs for post hospital transition for care coordination. Review of patient's electronic medical record reveals patient was admitted for Closed Fracture left mandibular alveolar process. Pt discharged home with [facility] services. No other needs for VBCI to address at this time.   Plan: [facility] Liaison will continue to follow progress and disposition to asess for post hospital community care coordination/management needs.  Referral request for community care coordination: anticipate Transitions of Care Team follow up.   VBCI Care Management/Population Health does not replace or interfere with any arrangements made by the Inpatient Transition of Care team.   For questions contact:   Lilla Reichert, RN, BSN Hospital Liaison [place]   [facility], Population Health Office Hours MTWF  8:00 am-6:00 pm Direct Dial: [phone] mobile [email or web address]

## 2024-01-27 ENCOUNTER — Encounter: Payer: Self-pay | Admitting: Family Medicine

## 2024-01-27 ENCOUNTER — Ambulatory Visit (INDEPENDENT_AMBULATORY_CARE_PROVIDER_SITE_OTHER): Payer: Self-pay | Admitting: Family Medicine

## 2024-01-27 ENCOUNTER — Encounter (HOSPITAL_COMMUNITY): Payer: Medicare Other

## 2024-01-27 VITALS — BP 135/70 | HR 67 | Resp 16 | Wt 141.1 lb

## 2024-01-27 DIAGNOSIS — R748 Abnormal levels of other serum enzymes: Secondary | ICD-10-CM | POA: Diagnosis not present

## 2024-01-27 DIAGNOSIS — Z4802 Encounter for removal of sutures: Secondary | ICD-10-CM

## 2024-01-27 DIAGNOSIS — N1831 Chronic kidney disease, stage 3a: Secondary | ICD-10-CM | POA: Diagnosis not present

## 2024-01-27 NOTE — Patient Instructions (Signed)
 Nurse visit in 1 week for removal of one sture, call if you need me before  Apply antibiotic ointment twice daily to wound, keep clean and dry   Sures removed today without incident , wound healing well except for the one remaining suture  Thanks for choosing [facility], we consider it a privelige to serve you.

## 2024-01-28 DIAGNOSIS — S02672D Fracture of alveolus of left mandible, subsequent encounter for fracture with routine healing: Secondary | ICD-10-CM | POA: Diagnosis not present

## 2024-01-28 DIAGNOSIS — Z860100 Personal history of colon polyps, unspecified: Secondary | ICD-10-CM | POA: Diagnosis not present

## 2024-01-28 DIAGNOSIS — I959 Hypotension, unspecified: Secondary | ICD-10-CM | POA: Diagnosis not present

## 2024-01-28 DIAGNOSIS — I480 Paroxysmal atrial fibrillation: Secondary | ICD-10-CM | POA: Diagnosis not present

## 2024-01-28 DIAGNOSIS — Z86718 Personal history of other venous thrombosis and embolism: Secondary | ICD-10-CM | POA: Diagnosis not present

## 2024-01-28 DIAGNOSIS — S065X0D Traumatic subdural hemorrhage without loss of consciousness, subsequent encounter: Secondary | ICD-10-CM | POA: Diagnosis not present

## 2024-01-28 DIAGNOSIS — Z923 Personal history of irradiation: Secondary | ICD-10-CM | POA: Diagnosis not present

## 2024-01-28 DIAGNOSIS — R001 Bradycardia, unspecified: Secondary | ICD-10-CM | POA: Diagnosis not present

## 2024-01-28 DIAGNOSIS — N183 Chronic kidney disease, stage 3 unspecified: Secondary | ICD-10-CM | POA: Diagnosis not present

## 2024-01-28 DIAGNOSIS — Z853 Personal history of malignant neoplasm of breast: Secondary | ICD-10-CM | POA: Diagnosis not present

## 2024-01-28 DIAGNOSIS — Z86011 Personal history of benign neoplasm of the brain: Secondary | ICD-10-CM | POA: Diagnosis not present

## 2024-01-28 DIAGNOSIS — R569 Unspecified convulsions: Secondary | ICD-10-CM | POA: Diagnosis not present

## 2024-01-28 DIAGNOSIS — Z8611 Personal history of tuberculosis: Secondary | ICD-10-CM | POA: Diagnosis not present

## 2024-01-28 DIAGNOSIS — I5022 Chronic systolic (congestive) heart failure: Secondary | ICD-10-CM | POA: Diagnosis not present

## 2024-01-28 DIAGNOSIS — K219 Gastro-esophageal reflux disease without esophagitis: Secondary | ICD-10-CM | POA: Diagnosis not present

## 2024-01-28 DIAGNOSIS — Z8701 Personal history of pneumonia (recurrent): Secondary | ICD-10-CM | POA: Diagnosis not present

## 2024-01-28 DIAGNOSIS — I428 Other cardiomyopathies: Secondary | ICD-10-CM | POA: Diagnosis not present

## 2024-01-28 DIAGNOSIS — Z9221 Personal history of antineoplastic chemotherapy: Secondary | ICD-10-CM | POA: Diagnosis not present

## 2024-01-28 DIAGNOSIS — E785 Hyperlipidemia, unspecified: Secondary | ICD-10-CM | POA: Diagnosis not present

## 2024-01-28 DIAGNOSIS — Z7901 Long term (current) use of anticoagulants: Secondary | ICD-10-CM | POA: Diagnosis not present

## 2024-01-28 DIAGNOSIS — F32A Depression, unspecified: Secondary | ICD-10-CM | POA: Diagnosis not present

## 2024-01-28 DIAGNOSIS — K449 Diaphragmatic hernia without obstruction or gangrene: Secondary | ICD-10-CM | POA: Diagnosis not present

## 2024-01-28 DIAGNOSIS — I471 Supraventricular tachycardia, unspecified: Secondary | ICD-10-CM | POA: Diagnosis not present

## 2024-01-28 DIAGNOSIS — F419 Anxiety disorder, unspecified: Secondary | ICD-10-CM | POA: Diagnosis not present

## 2024-01-28 DIAGNOSIS — Z9011 Acquired absence of right breast and nipple: Secondary | ICD-10-CM | POA: Diagnosis not present

## 2024-01-28 LAB — CMP14+EGFR
ALT: 58 IU/L — ABNORMAL HIGH (ref 0–32)
AST: 46 IU/L — ABNORMAL HIGH (ref 0–40)
Albumin: 3.6 g/dL — ABNORMAL LOW (ref 3.8–4.8)
Alkaline Phosphatase: 120 IU/L (ref 44–121)
BUN/Creatinine Ratio: 16 (ref 12–28)
BUN: 22 mg/dL (ref 8–27)
Bilirubin Total: 0.3 mg/dL (ref 0.0–1.2)
CO2: 22 mmol/L (ref 20–29)
Calcium: 9 mg/dL (ref 8.7–10.3)
Chloride: 103 mmol/L (ref 96–106)
Creatinine, Ser: 1.34 mg/dL — ABNORMAL HIGH (ref 0.57–1.00)
Globulin, Total: 3.2 g/dL (ref 1.5–4.5)
Glucose: 84 mg/dL (ref 70–99)
Potassium: 4.7 mmol/L (ref 3.5–5.2)
Sodium: 142 mmol/L (ref 134–144)
Total Protein: 6.8 g/dL (ref 6.0–8.5)
eGFR: 41 mL/min/{1.73_m2} — ABNORMAL LOW (ref >59)

## 2024-01-29 ENCOUNTER — Telehealth: Payer: Self-pay | Admitting: Family Medicine

## 2024-01-29 DIAGNOSIS — Z86718 Personal history of other venous thrombosis and embolism: Secondary | ICD-10-CM | POA: Diagnosis not present

## 2024-01-29 DIAGNOSIS — R001 Bradycardia, unspecified: Secondary | ICD-10-CM | POA: Diagnosis not present

## 2024-01-29 DIAGNOSIS — Z860100 Personal history of colon polyps, unspecified: Secondary | ICD-10-CM | POA: Diagnosis not present

## 2024-01-29 DIAGNOSIS — F419 Anxiety disorder, unspecified: Secondary | ICD-10-CM | POA: Diagnosis not present

## 2024-01-29 DIAGNOSIS — E785 Hyperlipidemia, unspecified: Secondary | ICD-10-CM | POA: Diagnosis not present

## 2024-01-29 DIAGNOSIS — S065X0D Traumatic subdural hemorrhage without loss of consciousness, subsequent encounter: Secondary | ICD-10-CM | POA: Diagnosis not present

## 2024-01-29 DIAGNOSIS — Z9011 Acquired absence of right breast and nipple: Secondary | ICD-10-CM | POA: Diagnosis not present

## 2024-01-29 DIAGNOSIS — Z7901 Long term (current) use of anticoagulants: Secondary | ICD-10-CM | POA: Diagnosis not present

## 2024-01-29 DIAGNOSIS — Z9221 Personal history of antineoplastic chemotherapy: Secondary | ICD-10-CM | POA: Diagnosis not present

## 2024-01-29 DIAGNOSIS — I471 Supraventricular tachycardia, unspecified: Secondary | ICD-10-CM | POA: Diagnosis not present

## 2024-01-29 DIAGNOSIS — R569 Unspecified convulsions: Secondary | ICD-10-CM | POA: Diagnosis not present

## 2024-01-29 DIAGNOSIS — Z8611 Personal history of tuberculosis: Secondary | ICD-10-CM | POA: Diagnosis not present

## 2024-01-29 DIAGNOSIS — S02672D Fracture of alveolus of left mandible, subsequent encounter for fracture with routine healing: Secondary | ICD-10-CM | POA: Diagnosis not present

## 2024-01-29 DIAGNOSIS — Z8701 Personal history of pneumonia (recurrent): Secondary | ICD-10-CM | POA: Diagnosis not present

## 2024-01-29 DIAGNOSIS — F32A Depression, unspecified: Secondary | ICD-10-CM | POA: Diagnosis not present

## 2024-01-29 DIAGNOSIS — K219 Gastro-esophageal reflux disease without esophagitis: Secondary | ICD-10-CM | POA: Diagnosis not present

## 2024-01-29 DIAGNOSIS — N183 Chronic kidney disease, stage 3 unspecified: Secondary | ICD-10-CM | POA: Diagnosis not present

## 2024-01-29 DIAGNOSIS — I959 Hypotension, unspecified: Secondary | ICD-10-CM | POA: Diagnosis not present

## 2024-01-29 DIAGNOSIS — I5022 Chronic systolic (congestive) heart failure: Secondary | ICD-10-CM | POA: Diagnosis not present

## 2024-01-29 DIAGNOSIS — I480 Paroxysmal atrial fibrillation: Secondary | ICD-10-CM | POA: Diagnosis not present

## 2024-01-29 DIAGNOSIS — Z86011 Personal history of benign neoplasm of the brain: Secondary | ICD-10-CM | POA: Diagnosis not present

## 2024-01-29 DIAGNOSIS — K449 Diaphragmatic hernia without obstruction or gangrene: Secondary | ICD-10-CM | POA: Diagnosis not present

## 2024-01-29 DIAGNOSIS — I428 Other cardiomyopathies: Secondary | ICD-10-CM | POA: Diagnosis not present

## 2024-01-29 DIAGNOSIS — Z923 Personal history of irradiation: Secondary | ICD-10-CM | POA: Diagnosis not present

## 2024-01-29 DIAGNOSIS — Z853 Personal history of malignant neoplasm of breast: Secondary | ICD-10-CM | POA: Diagnosis not present

## 2024-01-29 LAB — HEPB+HEPC+HIV PANEL
HIV Screen 4th Generation wRfx: NONREACTIVE
Hep B C IgM: NEGATIVE
Hep B Core Total Ab: NEGATIVE
Hep B E Ab: NONREACTIVE
Hep B E Ag: NEGATIVE
Hep B Surface Ab, Qual: NONREACTIVE
Hep C Virus Ab: NONREACTIVE
Hepatitis B Surface Ag: NEGATIVE

## 2024-01-29 NOTE — Telephone Encounter (Signed)
 N/a, lvm to cb

## 2024-01-29 NOTE — Telephone Encounter (Signed)
 Copied from CRM [id number]. Topic: Clinical - Home Health Verbal Orders >> Jan 29, 2024 12:11 PM Phil Braun wrote: Caller/Agency: Cecily Cohen Number: [phone] Service Requested: Physical Therapy Frequency: 1 week 1. 2 week 2 and 1 week 3 Any new concerns about the patient? No

## 2024-02-01 ENCOUNTER — Encounter (HOSPITAL_COMMUNITY): Payer: Medicare Other

## 2024-02-01 NOTE — Progress Notes (Signed)
 ADVANCED HF CLINIC NOTE  PCP: Dr. Rodolph Clap Cardiology: Dr. Nunzio Belch HF Cardiology: Dr. Mitzie Anda  78 y.o. returns for followup of CHF.   Dr. Nunzio Belch has followed her for a short RP SVT.    Patient was initially found to have low EF in 2014. Echo 6/17: EF 35-40%. Cardiolite in 8/1: fixed apical defect but no evidence for ischemia. She was admitted in 1/18 with PNA, DVT, and CHF exacerbation.  Echo at that time showed EF down to 20-25%.  She was diuresed and discharged.   Patient was diagnosed with breast cancer in 2008 and had lumpectomy, radiation, and chemotherapy which included epirubicin, and anthracycline.  Recurrent cancer in 2019 with right mastectomy and chemo with cyclophosphamide , MTX, and fluorouracil .   RHC/LHC 10/18: no significant CAD and mildly elevate PCWP.  Cardiac MRI 11/18: EF 49%, no LGE.   Echo 5/21: EF 45%, GHK, mildly decreased RV systolic function.  Echo 7/22: EF 55% with normal RV and IVC.    Patient was in atrial fibrillation with RVR in 6/23, converted back to NSR with amiodarone .   Echo 7/23: EF 40-45%, D-shaped septum, moderate RV dysfunction with mild RV enlargement, IVC normal.   9/24 hospitalized for aspiration PNA  Follow up 10/24, in atrial fibrillation and mildly volume overloaded. Lasix  increased to 40 daily, Farxiga  started and arranged for repeat echo and DCCV.   Echo 11/24 EF 30-35%, RV mildly reduced. DCCV 08/06/23 to NSR  Echo (3/25): EF 40%, normal RV, IVC normal Echo 4/25 EF 50-55%, normal RV   Admitted 4/25 post fall on eliquis . Found to be hypotensive and bradycardic on arrival 2/2 polypharmacy. Fall resultant with SDH and L mandible fx. Briefly required dopamine . Echo EF 55%. Neurosurgery ok with Eliquis  at discharge. Discharged with Zio. Amio/Farxiga  and spiro held at discharge. Started on midodrine .   Today she returns for AHF follow up with her husband. Overall feeling good. Denies palpitations, CP, dizziness, edema, or PND/Orthopnea.  Denies SOB. Appetite ok. No fever or chills. Weight at home 138-140 pounds. Taking all medications. Finished cardiac rehab prior to recent admit now working with PT at home. Still wearing her Zio.   ECG NSR 62 bpm (Personally reviewed)    PMH: 1. SVT: Short R-P SVT, followed by Dr. Nunzio Belch.  2. DVT in 1/18 in setting of PNA.  She was on Xarelto  for about 6 months.  3. Breast cancer: Diagnosed on right in 2008.  She had lumpectomy, radiation, and chemotherapy.  She had fluorouracil , epirubicin, Cytoxan .  - Recent mammogram with right breast calcifications => biopsy with complex sclerosing lesion.  - Recurrent cancer 2019, she had right mastectomy and chemotherapy with cyclophosphamide , MTX, fluorouracil .  4. GERD 5. Hyperlipidemia 6. Chronic systolic CHF: Uncertain cause of cardiomyopathy.  Echo in 2014 showed EF 40%.  It is possible that it is related to epirubicin use (anthracycline).  - Echo (6/17): EF 35-40%, mild LV dilation.  - Cardiolite (8/17): no ischemia, apical lateral/apical fixed perfusion defect.  - Echo (1/18): EF 20-25%, moderate MR, severe TR, D-shaped IV septum, PASP 35 mmHg.  - RHC/LHC (10/18): No angiographic CAD.  Mean RA 5, PA 44/11 mean 26, mean PCWP 18, CI 3.49.  - Cardiac MRI (11/18): EF 49%, diffuse mild hypokinesis, mild RV dilation with normal function, no LGE.  - Echo (5/21): EF 45%, global hypokinesis, mildly decreased RV systolic function.  - Echo (7/22): EF 55% with normal RV and IVC. - Echo (7/23): EF 40-45%, D-shaped septum, moderate RV dysfunction with  mild RV enlargement, IVC normal.  - Echo (11/24): EF 30-35%, RV systolic function reduced, moderate MR and moderate to severe TR - Echo (3/25): EF 40%, normal RV, IVC normal - Echo 4/25 EF 50-55%, normal RV  7. Atrial fibrillation: First noted in 6/23. S/p DCCV 11/24. 8. Meningioma: Resection in 6/23.  9. Aspiration PNA x 3 episodes. 10. CKD stage 3  FH: Colon cancer.  No known heart disease.   SH:  Married, never smoked.  Lives in [place].  Retired Engineer, civil (consulting).    ROS: All systems reviewed and negative except as per HPI.   Current Outpatient Medications  Medication Sig Dispense Refill   acetaminophen  (TYLENOL ) 500 MG tablet Take 1,000 mg by mouth every 6 (six) hours as needed for moderate pain (pain score 4-6) or mild pain (pain score 1-3).     apixaban  (ELIQUIS ) 5 MG TABS tablet Take 1 tablet (5 mg total) by mouth 2 (two) times daily. 60 tablet 6   bisacodyl  5 MG EC tablet One tablet every 3 days, if needed, for constipation (Patient taking differently: Take 5 mg by mouth daily as needed for moderate constipation or mild constipation.) 30 tablet 1   clonazePAM  (KLONOPIN ) 0.5 MG tablet Take 1 tablet (0.5 mg total) by mouth at bedtime. 30 tablet 5   docusate sodium  (COLACE) 100 MG capsule Take 100 mg by mouth 2 (two) times daily. As needed     fluticasone  (FLONASE ) 50 MCG/ACT nasal spray Place 2 sprays into both nostrils daily. (Patient taking differently: Place 2 sprays into both nostrils daily as needed for rhinitis or allergies.) 48 g 3   furosemide  (LASIX ) 20 MG tablet Take 1 tablet (20 mg total) by mouth daily as needed (for wt gain > 3 lb in 24h or > 5 lb in 1wk). 30 tablet 11   hydrocortisone  (ANUSOL -HC) 2.5 % rectal cream APPLY RECTALLY TWICE DAILY. (Patient taking differently: Place 1 Application rectally 2 (two) times daily as needed for hemorrhoids or anal itching.) 30 g 3   hydrOXYzine  (ATARAX ) 10 MG tablet Take one tablet at bedtime for itching (Patient taking differently: Take 10 mg by mouth at bedtime.) 30 tablet 3   levETIRAcetam  (KEPPRA ) 500 MG tablet Take 1 tablet (500 mg total) by mouth 2 (two) times daily. 180 tablet 3   midodrine  (PROAMATINE ) 5 MG tablet Take 1 tablet (5 mg total) by mouth 3 (three) times daily with meals. 90 tablet 0   montelukast  (SINGULAIR ) 10 MG tablet Take 1 tablet (10 mg total) by mouth at bedtime. 90 tablet 3   oxyCODONE  (OXY IR/ROXICODONE ) 5 MG  immediate release tablet Take 0.5-1 tablets (2.5-5 mg total) by mouth every 4 (four) hours as needed for severe pain (pain score 7-10). 10 tablet 0   pantoprazole  (PROTONIX ) 40 MG tablet TAKE ONE TABLET BY MOUTH TWICE DAILY 60 tablet 0   polyethylene glycol (MIRALAX ) 17 g packet Take one packet mira lax once daily in 8 ounces water  (Patient taking differently: Take 17 g by mouth daily as needed for moderate constipation, mild constipation or severe constipation.) 28 each 5   potassium chloride  SA (KLOR-CON  M) 20 MEQ tablet Take 1 tablet (20 mEq total) by mouth daily as needed (when take furosemide ). 30 tablet 11   sertraline  (ZOLOFT ) 25 MG tablet TAKE 1 TABLET BY MOUTH ONCE A DAY. 30 tablet 0   [Paused] amiodarone  (PACERONE ) 200 MG tablet Take 1 tablet (200 mg total) by mouth daily. (Patient not taking: Reported on 01/21/2024) 30 tablet  6   [Paused] rosuvastatin  (CRESTOR ) 10 MG tablet Take 1 tablet (10 mg total) by mouth daily. (Patient not taking: Reported on 02/03/2024) 90 tablet 3   [Paused] spironolactone  (ALDACTONE ) 25 MG tablet Take 0.5 tablets (12.5 mg total) by mouth daily. (Patient not taking: Reported on 01/21/2024) 45 tablet 3   No current facility-administered medications for this encounter.   Wt Readings from Last 3 Encounters:  02/03/24 63.5 kg (140 lb)  01/27/24 64 kg (141 lb 1 oz)  01/22/24 64 kg (141 lb 1.9 oz)   BP 120/70   Pulse 64   Wt 63.5 kg (140 lb)   SpO2 94%   BMI 30.30 kg/m  General:  well appearing.  No respiratory difficulty. Walked into clinic with walker.  Neck: supple. JVD flat.  Cor: PMI nondisplaced. Regular rate & rhythm. No rubs, gallops or murmurs. Lungs: clear Extremities: no cyanosis, clubbing, rash, edema  Neuro: alert & oriented x 3. Moves all 4 extremities w/o difficulty. Affect pleasant.   Assessment/Plan: 1. Chronic systolic CHF: Long-standing cardiomyopathy, since at least 2014.  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  Could be due  to epirubicin exposure with 2008 breast cancer treatment.  Cardiac MRI 11/18: EF up to 49% with no LGE.  Echo 5/21: EF 45% range.  Echo 7/22: EF 55%.  Echo 7/23: EF 40-45%, D-shaped septum, mod RV dysfunction with mild RV enlargement, IVC normal.  Back in atrial fibrillation 10/24, echo 11/24: EF 30-35%. ? If reduction in EF due to atrial fibrillation. Once back in NSR, echo 3/25 with EF about 40%.   - NYHA class II-IIIa symptoms, she is not volume overloaded on exam.  Low BP and CKD has limited GDMT.  - Continue Lasix  20 mg daily PRN. Has been taking lasix  1-2x/week.  - Off spiro and farxiga  d/t recent hypotension and fall. They do not want to start back either at this time. Can consider restarting Farxiga  at follow up. Had the most BP effect from spiro.  - Continue home PT. Consider cardiac rehab later on  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   - Off amiodarone   3. CKD stage 3: Baseline SCr 1.8.  - CMET today.  4. Atrial fibrillation: Persistent.  S/p DC-CV 11/24. She remains in NSR on amiodarone . - Continue Eliquis  5 mg bid. No bleeding issues. CBC today - Now off amiodarone  with persistent bradycardia requiring dopamine  during recent admit.  - Currently wearing Zio patch  5. Elevated LFTs: Mild, stable. ? Due to CHF, ? due to amiodarone .  - Need to follow LFTs. Now off amio.  - CMET today - Holding statin  Helped her take off her Zio (was due yesterday but was told we would take it off today). Placed in return bag and instructed her husband to drop it off as indicated. Plan to drop off today after appt.   Follow up in 4-6 weeks with APP.   Sheryl Donna  02/03/2024

## 2024-02-02 ENCOUNTER — Ambulatory Visit

## 2024-02-03 ENCOUNTER — Ambulatory Visit (HOSPITAL_COMMUNITY): Payer: Self-pay | Admitting: Internal Medicine

## 2024-02-03 ENCOUNTER — Encounter: Payer: Self-pay | Admitting: Family Medicine

## 2024-02-03 ENCOUNTER — Encounter (HOSPITAL_COMMUNITY): Payer: Medicare Other

## 2024-02-03 ENCOUNTER — Encounter (HOSPITAL_COMMUNITY): Payer: Self-pay

## 2024-02-03 ENCOUNTER — Ambulatory Visit (HOSPITAL_COMMUNITY)
Admission: RE | Admit: 2024-02-03 | Discharge: 2024-02-03 | Disposition: A | Discharge status: Home / Self Care | Source: Ambulatory Visit | Attending: Family Medicine | Admitting: Family Medicine

## 2024-02-03 VITALS — BP 120/70 | HR 64 | Wt 140.0 lb

## 2024-02-03 DIAGNOSIS — I5022 Chronic systolic (congestive) heart failure: Secondary | ICD-10-CM | POA: Diagnosis not present

## 2024-02-03 DIAGNOSIS — I480 Paroxysmal atrial fibrillation: Secondary | ICD-10-CM | POA: Diagnosis not present

## 2024-02-03 DIAGNOSIS — Z9221 Personal history of antineoplastic chemotherapy: Secondary | ICD-10-CM | POA: Diagnosis not present

## 2024-02-03 DIAGNOSIS — Z4802 Encounter for removal of sutures: Secondary | ICD-10-CM | POA: Insufficient documentation

## 2024-02-03 DIAGNOSIS — E785 Hyperlipidemia, unspecified: Secondary | ICD-10-CM | POA: Diagnosis not present

## 2024-02-03 DIAGNOSIS — Z79899 Other long term (current) drug therapy: Secondary | ICD-10-CM | POA: Insufficient documentation

## 2024-02-03 DIAGNOSIS — I4819 Other persistent atrial fibrillation: Secondary | ICD-10-CM | POA: Insufficient documentation

## 2024-02-03 DIAGNOSIS — Z9011 Acquired absence of right breast and nipple: Secondary | ICD-10-CM | POA: Diagnosis not present

## 2024-02-03 DIAGNOSIS — I428 Other cardiomyopathies: Secondary | ICD-10-CM | POA: Insufficient documentation

## 2024-02-03 DIAGNOSIS — F32A Depression, unspecified: Secondary | ICD-10-CM | POA: Diagnosis not present

## 2024-02-03 DIAGNOSIS — F419 Anxiety disorder, unspecified: Secondary | ICD-10-CM | POA: Diagnosis not present

## 2024-02-03 DIAGNOSIS — Z7901 Long term (current) use of anticoagulants: Secondary | ICD-10-CM | POA: Diagnosis not present

## 2024-02-03 DIAGNOSIS — I959 Hypotension, unspecified: Secondary | ICD-10-CM | POA: Diagnosis not present

## 2024-02-03 DIAGNOSIS — Z8611 Personal history of tuberculosis: Secondary | ICD-10-CM | POA: Diagnosis not present

## 2024-02-03 DIAGNOSIS — I471 Supraventricular tachycardia, unspecified: Secondary | ICD-10-CM | POA: Diagnosis not present

## 2024-02-03 DIAGNOSIS — Z86011 Personal history of benign neoplasm of the brain: Secondary | ICD-10-CM | POA: Diagnosis not present

## 2024-02-03 DIAGNOSIS — Z853 Personal history of malignant neoplasm of breast: Secondary | ICD-10-CM | POA: Diagnosis not present

## 2024-02-03 DIAGNOSIS — S02672D Fracture of alveolus of left mandible, subsequent encounter for fracture with routine healing: Secondary | ICD-10-CM | POA: Diagnosis not present

## 2024-02-03 DIAGNOSIS — Z8701 Personal history of pneumonia (recurrent): Secondary | ICD-10-CM | POA: Diagnosis not present

## 2024-02-03 DIAGNOSIS — I447 Left bundle-branch block, unspecified: Secondary | ICD-10-CM | POA: Insufficient documentation

## 2024-02-03 DIAGNOSIS — Z923 Personal history of irradiation: Secondary | ICD-10-CM | POA: Diagnosis not present

## 2024-02-03 DIAGNOSIS — Z86718 Personal history of other venous thrombosis and embolism: Secondary | ICD-10-CM | POA: Diagnosis not present

## 2024-02-03 DIAGNOSIS — S065X0D Traumatic subdural hemorrhage without loss of consciousness, subsequent encounter: Secondary | ICD-10-CM | POA: Diagnosis not present

## 2024-02-03 DIAGNOSIS — R001 Bradycardia, unspecified: Secondary | ICD-10-CM | POA: Diagnosis not present

## 2024-02-03 DIAGNOSIS — K219 Gastro-esophageal reflux disease without esophagitis: Secondary | ICD-10-CM | POA: Diagnosis not present

## 2024-02-03 DIAGNOSIS — N183 Chronic kidney disease, stage 3 unspecified: Secondary | ICD-10-CM | POA: Insufficient documentation

## 2024-02-03 DIAGNOSIS — N1832 Chronic kidney disease, stage 3b: Secondary | ICD-10-CM | POA: Diagnosis not present

## 2024-02-03 DIAGNOSIS — K449 Diaphragmatic hernia without obstruction or gangrene: Secondary | ICD-10-CM | POA: Diagnosis not present

## 2024-02-03 DIAGNOSIS — Z860100 Personal history of colon polyps, unspecified: Secondary | ICD-10-CM | POA: Diagnosis not present

## 2024-02-03 DIAGNOSIS — R569 Unspecified convulsions: Secondary | ICD-10-CM | POA: Diagnosis not present

## 2024-02-03 DIAGNOSIS — R7989 Other specified abnormal findings of blood chemistry: Secondary | ICD-10-CM | POA: Diagnosis not present

## 2024-02-03 LAB — COMPREHENSIVE METABOLIC PANEL WITH GFR
ALT: 56 U/L — ABNORMAL HIGH (ref 0–44)
AST: 56 U/L — ABNORMAL HIGH (ref 15–41)
Albumin: 3.3 g/dL — ABNORMAL LOW (ref 3.5–5.0)
Alkaline Phosphatase: 89 U/L (ref 38–126)
Anion gap: 9 (ref 5–15)
BUN: 26 mg/dL — ABNORMAL HIGH (ref 8–23)
CO2: 23 mmol/L (ref 22–32)
Calcium: 9.1 mg/dL (ref 8.9–10.3)
Chloride: 108 mmol/L (ref 98–111)
Creatinine, Ser: 1.51 mg/dL — ABNORMAL HIGH (ref 0.44–1.00)
GFR, Estimated: 35 mL/min — ABNORMAL LOW (ref >60)
Glucose, Bld: 90 mg/dL (ref 70–99)
Potassium: 4.6 mmol/L (ref 3.5–5.1)
Sodium: 140 mmol/L (ref 135–145)
Total Bilirubin: 0.7 mg/dL (ref 0.0–1.2)
Total Protein: 7.5 g/dL (ref 6.5–8.1)

## 2024-02-03 LAB — CBC
HCT: 37.7 % (ref 36.0–46.0)
Hemoglobin: 11.8 g/dL — ABNORMAL LOW (ref 12.0–15.0)
MCH: 26.1 pg (ref 26.0–34.0)
MCHC: 31.3 g/dL (ref 30.0–36.0)
MCV: 83.4 fL (ref 80.0–100.0)
Platelets: 228 10*3/uL (ref 150–400)
RBC: 4.52 MIL/uL (ref 3.87–5.11)
RDW: 15.9 % — ABNORMAL HIGH (ref 11.5–15.5)
WBC: 7.7 10*3/uL (ref 4.0–10.5)
nRBC: 0 % (ref 0.0–0.2)

## 2024-02-03 NOTE — Patient Instructions (Addendum)
 Thank you for coming in today  If you had labs drawn today, any labs that are abnormal the clinic will call you No news is good news  Medications: No changes  Follow up appointments:  Your physician recommends that you schedule a follow-up appointment in:  4-6 weeks in clinic    Do the following things EVERYDAY: Weigh yourself in the morning before breakfast. Write it down and keep it in a log. Take your medicines as prescribed Eat low salt foods--Limit salt (sodium) to 2000 mg per day.  Stay as active as you can everyday Limit all fluids for the day to less than 2 liters   At the [facility], you and your health needs are our priority. As part of our continuing mission to provide you with exceptional heart care, we have created designated Provider Care Teams. These Care Teams include your primary Cardiologist (physician) and Advanced Practice Providers (APPs- Physician Assistants and Nurse Practitioners) who all work together to provide you with the care you need, when you need it.   You may see any of the following providers on your designated Care Team at your next follow up: Dr Jules Oar Dr Peder Bourdon Dr. Mimi Alt, NP Ruddy Corral, [place] [facility] [place], [place] Dennise Fitz, NP Luster Salters, PharmD   Please be sure to bring in all your medications bottles to every appointment.    Thank you for choosing [facility]  If you have any questions or concerns before your next appointment please send us  a message through [place] or call our office at [phone].    TO LEAVE A MESSAGE FOR THE NURSE SELECT OPTION 2, PLEASE LEAVE A MESSAGE INCLUDING: YOUR NAME DATE OF BIRTH CALL BACK NUMBER REASON FOR CALL**this is important as we prioritize the call backs  YOU WILL RECEIVE A CALL BACK THE SAME DAY AS LONG AS YOU CALL BEFORE 4:00 PM

## 2024-02-03 NOTE — Progress Notes (Signed)
   Tara [name]     MRN: [id number]      DOB: Aug 02, 1946  Chief Complaint  Patient presents with   Hospitalization Follow-up    Hospital follow up. Injury due to fall 01/14/2024   Suture / Staple Removal    Suture removal     HPI Tara [name] is here for follow up of recent hjospitalization for suture removal. Denies drainage , fever or chills ROS Denies recent fever or chills. . Denies skin break down or rash.   PE  BP 135/70   Pulse 67   Resp 16   Wt 141 lb 1 oz (64 kg)   SpO2 97%   BMI 30.53 kg/m   Patient alert and oriented and in no cardiopulmonary distress.  HEENT: No facial asymmetry, EOMI,     Neck decreased ROM.  Chest: Clear to auscultation bilaterally.  CVS: S1, S2 no murmurs, no S3.Regular rate.  ABD: Soft non tender.   Ext: No edema  MS: Decreased  ROM spine, shoulders, hips and knees.  Skin: sutures intact , 4 cm laceration left mandible, crusted in areas no erythema  Pnoted.   Assessment & Plan  Encounter for removal of sutures Sutures removed by nursing with no adverse incident. 2 sutures remain in place and pt to return in next week for removal

## 2024-02-03 NOTE — Assessment & Plan Note (Signed)
Patient in for follow up of recent hospitalization. Discharge summary, and laboratory and radiology data are reviewed, and any questions or concerns  are discussed. Specific issues requiring follow up are specifically addressed.  

## 2024-02-03 NOTE — Progress Notes (Signed)
   Tara [name]     MRN: [id number]      DOB: Jul 05, 1946  Chief Complaint  Patient presents with   Medical Management of Chronic Issues    4 month follow up    Hospitalization Follow-up    Was admitted to hospital for a fall on 01/14/24    HPI Tara [name] is here for follow up of hospitalization from 4/24 to 01/19/2024 due to fall, just lost her balance and sustained left mandibular fracture Cardiology and Neurology, neurosurgery  , ENT and palliative care were all consulted during the hospitalization Many of her cardiac meds have been paused since the hospitalization Reported increased weakness with recent med adjustment by Cardiology Has sutures for removal at visit on left mandible on 01/27/2024 C/o increased bilateral knee pain and stiffness was just d/c from PT but requests starting again due to pain and recent fall ROS Denies recent fever or chills. Denies sinus pressure, nasal congestion, ear pain or sore throat. Denies chest congestion, productive cough or wheezing. Denies chest pains, palpitations and leg swelling Denies abdominal pain, nausea, vomiting,diarrhea or constipation.   Denies dysuria, frequency, hesitancy or incontinence.  Denies headaches, seizures, numbness, or tingling. Denies  uncontrolled depression, anxiety or insomnia. Denies skin break down or rash.   PE  BP 114/66   Pulse 63   Resp 16   Ht 4\' 9"  (1.448 m)   Wt 141 lb 1.9 oz (64 kg)   SpO2 90%   BMI 30.54 kg/m   Patient alert and oriented and in no cardiopulmonary distress.  HEENT: , EOMI,     Neck decreased ROM, periorbital bruising left , bruising of left mandible , sutures in place , no erythema or drainage .  Chest: Clear to auscultation bilaterally.  CVS: S1, S2 no murmurs, no S3.Regular rate.  ABD: Soft non tender.   Ext: No edema  MS: decreased ROM spine, shoulders, hips and knees.  Skin: Intact, no ulcerations or rash noted.  Psych: Good eye contact,  not anxious or depressed  appearing.  CNS: CN 2-12 intact, power,  normal throughout.no focal deficits noted.   Assessment & Plan  Hospital discharge follow-up Patient in for follow up of recent hospitalization. Discharge summary, and laboratory and radiology data are reviewed, and any questions or concerns  are discussed. Specific issues requiring follow up are specifically addressed.   Acute pain of both knees Reports increased bilateral knee pain following recent fall , left greater than right refer PT  Fall Unsteady gait with recent fall refer to PT/ OT

## 2024-02-03 NOTE — Assessment & Plan Note (Signed)
 Reports increased bilateral knee pain following recent fall , left greater than right refer PT

## 2024-02-03 NOTE — Assessment & Plan Note (Signed)
 Sutures removed by nursing with no adverse incident. 2 sutures remain in place and pt to return in next week for removal

## 2024-02-03 NOTE — Assessment & Plan Note (Signed)
 Unsteady gait with recent fall refer to PT/ OT

## 2024-02-04 DIAGNOSIS — Z923 Personal history of irradiation: Secondary | ICD-10-CM | POA: Diagnosis not present

## 2024-02-04 DIAGNOSIS — F32A Depression, unspecified: Secondary | ICD-10-CM | POA: Diagnosis not present

## 2024-02-04 DIAGNOSIS — N183 Chronic kidney disease, stage 3 unspecified: Secondary | ICD-10-CM | POA: Diagnosis not present

## 2024-02-04 DIAGNOSIS — E785 Hyperlipidemia, unspecified: Secondary | ICD-10-CM | POA: Diagnosis not present

## 2024-02-04 DIAGNOSIS — Z8701 Personal history of pneumonia (recurrent): Secondary | ICD-10-CM | POA: Diagnosis not present

## 2024-02-04 DIAGNOSIS — I471 Supraventricular tachycardia, unspecified: Secondary | ICD-10-CM | POA: Diagnosis not present

## 2024-02-04 DIAGNOSIS — Z9011 Acquired absence of right breast and nipple: Secondary | ICD-10-CM | POA: Diagnosis not present

## 2024-02-04 DIAGNOSIS — Z7901 Long term (current) use of anticoagulants: Secondary | ICD-10-CM | POA: Diagnosis not present

## 2024-02-04 DIAGNOSIS — I428 Other cardiomyopathies: Secondary | ICD-10-CM | POA: Diagnosis not present

## 2024-02-04 DIAGNOSIS — R001 Bradycardia, unspecified: Secondary | ICD-10-CM | POA: Diagnosis not present

## 2024-02-04 DIAGNOSIS — R569 Unspecified convulsions: Secondary | ICD-10-CM | POA: Diagnosis not present

## 2024-02-04 DIAGNOSIS — I959 Hypotension, unspecified: Secondary | ICD-10-CM | POA: Diagnosis not present

## 2024-02-04 DIAGNOSIS — S02672D Fracture of alveolus of left mandible, subsequent encounter for fracture with routine healing: Secondary | ICD-10-CM | POA: Diagnosis not present

## 2024-02-04 DIAGNOSIS — Z9221 Personal history of antineoplastic chemotherapy: Secondary | ICD-10-CM | POA: Diagnosis not present

## 2024-02-04 DIAGNOSIS — F419 Anxiety disorder, unspecified: Secondary | ICD-10-CM | POA: Diagnosis not present

## 2024-02-04 DIAGNOSIS — Z8611 Personal history of tuberculosis: Secondary | ICD-10-CM | POA: Diagnosis not present

## 2024-02-04 DIAGNOSIS — S065X0D Traumatic subdural hemorrhage without loss of consciousness, subsequent encounter: Secondary | ICD-10-CM | POA: Diagnosis not present

## 2024-02-04 DIAGNOSIS — Z86011 Personal history of benign neoplasm of the brain: Secondary | ICD-10-CM | POA: Diagnosis not present

## 2024-02-04 DIAGNOSIS — Z860100 Personal history of colon polyps, unspecified: Secondary | ICD-10-CM | POA: Diagnosis not present

## 2024-02-04 DIAGNOSIS — Z853 Personal history of malignant neoplasm of breast: Secondary | ICD-10-CM | POA: Diagnosis not present

## 2024-02-04 DIAGNOSIS — I5022 Chronic systolic (congestive) heart failure: Secondary | ICD-10-CM | POA: Diagnosis not present

## 2024-02-04 DIAGNOSIS — K449 Diaphragmatic hernia without obstruction or gangrene: Secondary | ICD-10-CM | POA: Diagnosis not present

## 2024-02-04 DIAGNOSIS — I480 Paroxysmal atrial fibrillation: Secondary | ICD-10-CM | POA: Diagnosis not present

## 2024-02-04 DIAGNOSIS — Z86718 Personal history of other venous thrombosis and embolism: Secondary | ICD-10-CM | POA: Diagnosis not present

## 2024-02-04 DIAGNOSIS — K219 Gastro-esophageal reflux disease without esophagitis: Secondary | ICD-10-CM | POA: Diagnosis not present

## 2024-02-05 DIAGNOSIS — I471 Supraventricular tachycardia, unspecified: Secondary | ICD-10-CM | POA: Diagnosis not present

## 2024-02-05 DIAGNOSIS — K449 Diaphragmatic hernia without obstruction or gangrene: Secondary | ICD-10-CM | POA: Diagnosis not present

## 2024-02-05 DIAGNOSIS — Z923 Personal history of irradiation: Secondary | ICD-10-CM | POA: Diagnosis not present

## 2024-02-05 DIAGNOSIS — Z853 Personal history of malignant neoplasm of breast: Secondary | ICD-10-CM | POA: Diagnosis not present

## 2024-02-05 DIAGNOSIS — I959 Hypotension, unspecified: Secondary | ICD-10-CM | POA: Diagnosis not present

## 2024-02-05 DIAGNOSIS — S02672D Fracture of alveolus of left mandible, subsequent encounter for fracture with routine healing: Secondary | ICD-10-CM | POA: Diagnosis not present

## 2024-02-05 DIAGNOSIS — N183 Chronic kidney disease, stage 3 unspecified: Secondary | ICD-10-CM | POA: Diagnosis not present

## 2024-02-05 DIAGNOSIS — Z9221 Personal history of antineoplastic chemotherapy: Secondary | ICD-10-CM | POA: Diagnosis not present

## 2024-02-05 DIAGNOSIS — Z7901 Long term (current) use of anticoagulants: Secondary | ICD-10-CM | POA: Diagnosis not present

## 2024-02-05 DIAGNOSIS — R001 Bradycardia, unspecified: Secondary | ICD-10-CM | POA: Diagnosis not present

## 2024-02-05 DIAGNOSIS — K219 Gastro-esophageal reflux disease without esophagitis: Secondary | ICD-10-CM | POA: Diagnosis not present

## 2024-02-05 DIAGNOSIS — Z8701 Personal history of pneumonia (recurrent): Secondary | ICD-10-CM | POA: Diagnosis not present

## 2024-02-05 DIAGNOSIS — Z86011 Personal history of benign neoplasm of the brain: Secondary | ICD-10-CM | POA: Diagnosis not present

## 2024-02-05 DIAGNOSIS — F419 Anxiety disorder, unspecified: Secondary | ICD-10-CM | POA: Diagnosis not present

## 2024-02-05 DIAGNOSIS — R569 Unspecified convulsions: Secondary | ICD-10-CM | POA: Diagnosis not present

## 2024-02-05 DIAGNOSIS — I480 Paroxysmal atrial fibrillation: Secondary | ICD-10-CM | POA: Diagnosis not present

## 2024-02-05 DIAGNOSIS — Z9011 Acquired absence of right breast and nipple: Secondary | ICD-10-CM | POA: Diagnosis not present

## 2024-02-05 DIAGNOSIS — Z8611 Personal history of tuberculosis: Secondary | ICD-10-CM | POA: Diagnosis not present

## 2024-02-05 DIAGNOSIS — Z86718 Personal history of other venous thrombosis and embolism: Secondary | ICD-10-CM | POA: Diagnosis not present

## 2024-02-05 DIAGNOSIS — S065X0D Traumatic subdural hemorrhage without loss of consciousness, subsequent encounter: Secondary | ICD-10-CM | POA: Diagnosis not present

## 2024-02-05 DIAGNOSIS — E785 Hyperlipidemia, unspecified: Secondary | ICD-10-CM | POA: Diagnosis not present

## 2024-02-05 DIAGNOSIS — I428 Other cardiomyopathies: Secondary | ICD-10-CM | POA: Diagnosis not present

## 2024-02-05 DIAGNOSIS — F32A Depression, unspecified: Secondary | ICD-10-CM | POA: Diagnosis not present

## 2024-02-05 DIAGNOSIS — I5022 Chronic systolic (congestive) heart failure: Secondary | ICD-10-CM | POA: Diagnosis not present

## 2024-02-05 DIAGNOSIS — Z860100 Personal history of colon polyps, unspecified: Secondary | ICD-10-CM | POA: Diagnosis not present

## 2024-02-08 ENCOUNTER — Encounter (HOSPITAL_COMMUNITY): Payer: Medicare Other

## 2024-02-10 ENCOUNTER — Encounter (HOSPITAL_COMMUNITY): Payer: Medicare Other

## 2024-02-10 DIAGNOSIS — I5022 Chronic systolic (congestive) heart failure: Secondary | ICD-10-CM | POA: Diagnosis not present

## 2024-02-10 DIAGNOSIS — Z86718 Personal history of other venous thrombosis and embolism: Secondary | ICD-10-CM | POA: Diagnosis not present

## 2024-02-10 DIAGNOSIS — R569 Unspecified convulsions: Secondary | ICD-10-CM | POA: Diagnosis not present

## 2024-02-10 DIAGNOSIS — Z86011 Personal history of benign neoplasm of the brain: Secondary | ICD-10-CM | POA: Diagnosis not present

## 2024-02-10 DIAGNOSIS — S02672D Fracture of alveolus of left mandible, subsequent encounter for fracture with routine healing: Secondary | ICD-10-CM | POA: Diagnosis not present

## 2024-02-10 DIAGNOSIS — F419 Anxiety disorder, unspecified: Secondary | ICD-10-CM | POA: Diagnosis not present

## 2024-02-10 DIAGNOSIS — Z7901 Long term (current) use of anticoagulants: Secondary | ICD-10-CM | POA: Diagnosis not present

## 2024-02-10 DIAGNOSIS — I471 Supraventricular tachycardia, unspecified: Secondary | ICD-10-CM | POA: Diagnosis not present

## 2024-02-10 DIAGNOSIS — K449 Diaphragmatic hernia without obstruction or gangrene: Secondary | ICD-10-CM | POA: Diagnosis not present

## 2024-02-10 DIAGNOSIS — N183 Chronic kidney disease, stage 3 unspecified: Secondary | ICD-10-CM | POA: Diagnosis not present

## 2024-02-10 DIAGNOSIS — K219 Gastro-esophageal reflux disease without esophagitis: Secondary | ICD-10-CM | POA: Diagnosis not present

## 2024-02-10 DIAGNOSIS — I959 Hypotension, unspecified: Secondary | ICD-10-CM | POA: Diagnosis not present

## 2024-02-10 DIAGNOSIS — Z8611 Personal history of tuberculosis: Secondary | ICD-10-CM | POA: Diagnosis not present

## 2024-02-10 DIAGNOSIS — I480 Paroxysmal atrial fibrillation: Secondary | ICD-10-CM | POA: Diagnosis not present

## 2024-02-10 DIAGNOSIS — E785 Hyperlipidemia, unspecified: Secondary | ICD-10-CM | POA: Diagnosis not present

## 2024-02-10 DIAGNOSIS — Z9221 Personal history of antineoplastic chemotherapy: Secondary | ICD-10-CM | POA: Diagnosis not present

## 2024-02-10 DIAGNOSIS — R001 Bradycardia, unspecified: Secondary | ICD-10-CM | POA: Diagnosis not present

## 2024-02-10 DIAGNOSIS — Z9011 Acquired absence of right breast and nipple: Secondary | ICD-10-CM | POA: Diagnosis not present

## 2024-02-10 DIAGNOSIS — Z853 Personal history of malignant neoplasm of breast: Secondary | ICD-10-CM | POA: Diagnosis not present

## 2024-02-10 DIAGNOSIS — S065X0D Traumatic subdural hemorrhage without loss of consciousness, subsequent encounter: Secondary | ICD-10-CM | POA: Diagnosis not present

## 2024-02-10 DIAGNOSIS — F32A Depression, unspecified: Secondary | ICD-10-CM | POA: Diagnosis not present

## 2024-02-10 DIAGNOSIS — I428 Other cardiomyopathies: Secondary | ICD-10-CM | POA: Diagnosis not present

## 2024-02-10 DIAGNOSIS — Z860100 Personal history of colon polyps, unspecified: Secondary | ICD-10-CM | POA: Diagnosis not present

## 2024-02-10 DIAGNOSIS — Z923 Personal history of irradiation: Secondary | ICD-10-CM | POA: Diagnosis not present

## 2024-02-10 DIAGNOSIS — Z8701 Personal history of pneumonia (recurrent): Secondary | ICD-10-CM | POA: Diagnosis not present

## 2024-02-11 DIAGNOSIS — I959 Hypotension, unspecified: Secondary | ICD-10-CM | POA: Diagnosis not present

## 2024-02-11 DIAGNOSIS — Z8701 Personal history of pneumonia (recurrent): Secondary | ICD-10-CM | POA: Diagnosis not present

## 2024-02-11 DIAGNOSIS — Z9221 Personal history of antineoplastic chemotherapy: Secondary | ICD-10-CM | POA: Diagnosis not present

## 2024-02-11 DIAGNOSIS — N183 Chronic kidney disease, stage 3 unspecified: Secondary | ICD-10-CM | POA: Diagnosis not present

## 2024-02-11 DIAGNOSIS — R001 Bradycardia, unspecified: Secondary | ICD-10-CM | POA: Diagnosis not present

## 2024-02-11 DIAGNOSIS — Z7901 Long term (current) use of anticoagulants: Secondary | ICD-10-CM | POA: Diagnosis not present

## 2024-02-11 DIAGNOSIS — Z86718 Personal history of other venous thrombosis and embolism: Secondary | ICD-10-CM | POA: Diagnosis not present

## 2024-02-11 DIAGNOSIS — F419 Anxiety disorder, unspecified: Secondary | ICD-10-CM | POA: Diagnosis not present

## 2024-02-11 DIAGNOSIS — S065X0D Traumatic subdural hemorrhage without loss of consciousness, subsequent encounter: Secondary | ICD-10-CM | POA: Diagnosis not present

## 2024-02-11 DIAGNOSIS — F32A Depression, unspecified: Secondary | ICD-10-CM | POA: Diagnosis not present

## 2024-02-11 DIAGNOSIS — I428 Other cardiomyopathies: Secondary | ICD-10-CM | POA: Diagnosis not present

## 2024-02-11 DIAGNOSIS — Z9011 Acquired absence of right breast and nipple: Secondary | ICD-10-CM | POA: Diagnosis not present

## 2024-02-11 DIAGNOSIS — I480 Paroxysmal atrial fibrillation: Secondary | ICD-10-CM | POA: Diagnosis not present

## 2024-02-11 DIAGNOSIS — Z923 Personal history of irradiation: Secondary | ICD-10-CM | POA: Diagnosis not present

## 2024-02-11 DIAGNOSIS — S02672D Fracture of alveolus of left mandible, subsequent encounter for fracture with routine healing: Secondary | ICD-10-CM | POA: Diagnosis not present

## 2024-02-11 DIAGNOSIS — K219 Gastro-esophageal reflux disease without esophagitis: Secondary | ICD-10-CM | POA: Diagnosis not present

## 2024-02-11 DIAGNOSIS — Z853 Personal history of malignant neoplasm of breast: Secondary | ICD-10-CM | POA: Diagnosis not present

## 2024-02-11 DIAGNOSIS — Z86011 Personal history of benign neoplasm of the brain: Secondary | ICD-10-CM | POA: Diagnosis not present

## 2024-02-11 DIAGNOSIS — E785 Hyperlipidemia, unspecified: Secondary | ICD-10-CM | POA: Diagnosis not present

## 2024-02-11 DIAGNOSIS — Z860100 Personal history of colon polyps, unspecified: Secondary | ICD-10-CM | POA: Diagnosis not present

## 2024-02-11 DIAGNOSIS — R569 Unspecified convulsions: Secondary | ICD-10-CM | POA: Diagnosis not present

## 2024-02-11 DIAGNOSIS — Z8611 Personal history of tuberculosis: Secondary | ICD-10-CM | POA: Diagnosis not present

## 2024-02-11 DIAGNOSIS — I471 Supraventricular tachycardia, unspecified: Secondary | ICD-10-CM | POA: Diagnosis not present

## 2024-02-11 DIAGNOSIS — I5022 Chronic systolic (congestive) heart failure: Secondary | ICD-10-CM | POA: Diagnosis not present

## 2024-02-11 DIAGNOSIS — K449 Diaphragmatic hernia without obstruction or gangrene: Secondary | ICD-10-CM | POA: Diagnosis not present

## 2024-02-12 ENCOUNTER — Other Ambulatory Visit (HOSPITAL_COMMUNITY)

## 2024-02-12 DIAGNOSIS — I428 Other cardiomyopathies: Secondary | ICD-10-CM | POA: Diagnosis not present

## 2024-02-12 DIAGNOSIS — Z9011 Acquired absence of right breast and nipple: Secondary | ICD-10-CM | POA: Diagnosis not present

## 2024-02-12 DIAGNOSIS — R001 Bradycardia, unspecified: Secondary | ICD-10-CM | POA: Diagnosis not present

## 2024-02-12 DIAGNOSIS — Z9221 Personal history of antineoplastic chemotherapy: Secondary | ICD-10-CM | POA: Diagnosis not present

## 2024-02-12 DIAGNOSIS — R569 Unspecified convulsions: Secondary | ICD-10-CM | POA: Diagnosis not present

## 2024-02-12 DIAGNOSIS — K449 Diaphragmatic hernia without obstruction or gangrene: Secondary | ICD-10-CM | POA: Diagnosis not present

## 2024-02-12 DIAGNOSIS — F32A Depression, unspecified: Secondary | ICD-10-CM | POA: Diagnosis not present

## 2024-02-12 DIAGNOSIS — S065X0D Traumatic subdural hemorrhage without loss of consciousness, subsequent encounter: Secondary | ICD-10-CM | POA: Diagnosis not present

## 2024-02-12 DIAGNOSIS — Z860100 Personal history of colon polyps, unspecified: Secondary | ICD-10-CM | POA: Diagnosis not present

## 2024-02-12 DIAGNOSIS — Z86011 Personal history of benign neoplasm of the brain: Secondary | ICD-10-CM | POA: Diagnosis not present

## 2024-02-12 DIAGNOSIS — Z923 Personal history of irradiation: Secondary | ICD-10-CM | POA: Diagnosis not present

## 2024-02-12 DIAGNOSIS — I480 Paroxysmal atrial fibrillation: Secondary | ICD-10-CM | POA: Diagnosis not present

## 2024-02-12 DIAGNOSIS — K219 Gastro-esophageal reflux disease without esophagitis: Secondary | ICD-10-CM | POA: Diagnosis not present

## 2024-02-12 DIAGNOSIS — I5022 Chronic systolic (congestive) heart failure: Secondary | ICD-10-CM | POA: Diagnosis not present

## 2024-02-12 DIAGNOSIS — Z86718 Personal history of other venous thrombosis and embolism: Secondary | ICD-10-CM | POA: Diagnosis not present

## 2024-02-12 DIAGNOSIS — Z8611 Personal history of tuberculosis: Secondary | ICD-10-CM | POA: Diagnosis not present

## 2024-02-12 DIAGNOSIS — E785 Hyperlipidemia, unspecified: Secondary | ICD-10-CM | POA: Diagnosis not present

## 2024-02-12 DIAGNOSIS — Z853 Personal history of malignant neoplasm of breast: Secondary | ICD-10-CM | POA: Diagnosis not present

## 2024-02-12 DIAGNOSIS — I959 Hypotension, unspecified: Secondary | ICD-10-CM | POA: Diagnosis not present

## 2024-02-12 DIAGNOSIS — I471 Supraventricular tachycardia, unspecified: Secondary | ICD-10-CM | POA: Diagnosis not present

## 2024-02-12 DIAGNOSIS — F419 Anxiety disorder, unspecified: Secondary | ICD-10-CM | POA: Diagnosis not present

## 2024-02-12 DIAGNOSIS — N183 Chronic kidney disease, stage 3 unspecified: Secondary | ICD-10-CM | POA: Diagnosis not present

## 2024-02-12 DIAGNOSIS — S02672D Fracture of alveolus of left mandible, subsequent encounter for fracture with routine healing: Secondary | ICD-10-CM | POA: Diagnosis not present

## 2024-02-12 DIAGNOSIS — Z8701 Personal history of pneumonia (recurrent): Secondary | ICD-10-CM | POA: Diagnosis not present

## 2024-02-12 DIAGNOSIS — Z7901 Long term (current) use of anticoagulants: Secondary | ICD-10-CM | POA: Diagnosis not present

## 2024-02-15 ENCOUNTER — Other Ambulatory Visit: Payer: Self-pay | Admitting: Family Medicine

## 2024-02-15 ENCOUNTER — Encounter (HOSPITAL_COMMUNITY): Payer: Medicare Other

## 2024-02-16 ENCOUNTER — Telehealth: Payer: Self-pay | Admitting: Family Medicine

## 2024-02-16 ENCOUNTER — Encounter (HOSPITAL_COMMUNITY): Payer: Self-pay

## 2024-02-16 ENCOUNTER — Ambulatory Visit (HOSPITAL_COMMUNITY): Payer: Self-pay | Admitting: Cardiology

## 2024-02-16 DIAGNOSIS — K080 Exfoliation of teeth due to systemic causes: Secondary | ICD-10-CM | POA: Diagnosis not present

## 2024-02-16 NOTE — Telephone Encounter (Unsigned)
 Copied from CRM [id number]. Topic: Clinical - Medication Refill >> Feb 16, 2024 10:18 AM Alyse July wrote: Medication: midodrine  (PROAMATINE ) 5 MG tablet   Has the patient contacted their pharmacy? Yes (Agent: If no, request that the patient contact the pharmacy for the refill. If patient does not wish to contact the pharmacy document the reason why and proceed with request.) (Agent: If yes, when and what did the pharmacy advise?)  This is the patient's preferred pharmacy:  [facility] - [place], [place] - [address] [place] [address] Phone: [phone] Fax: [phone]   Is this the correct pharmacy for this prescription? Yes If no, delete pharmacy and type the correct one.   Has the prescription been filled recently? No  Is the patient out of the medication? Yes  Has the patient been seen for an appointment in the last year OR does the patient have an upcoming appointment? Yes  Can we respond through MyChart? Yes  Agent: Please be advised that Rx refills may take up to 3 business days. We ask that you follow-up with your pharmacy.

## 2024-02-16 NOTE — Telephone Encounter (Signed)
 First attempt, n/a. Vm full

## 2024-02-16 NOTE — Telephone Encounter (Signed)
 The patient called back and I read her the message to get the meds from her Cardiologist and she will follow up with her Cardiologist concerning the midodrine  (PROAMATINE ) 5 MG tablet

## 2024-02-17 ENCOUNTER — Encounter (HOSPITAL_COMMUNITY): Payer: Medicare Other

## 2024-02-18 ENCOUNTER — Encounter: Payer: Self-pay | Admitting: Family Medicine

## 2024-02-18 DIAGNOSIS — K449 Diaphragmatic hernia without obstruction or gangrene: Secondary | ICD-10-CM | POA: Diagnosis not present

## 2024-02-18 DIAGNOSIS — Z860100 Personal history of colon polyps, unspecified: Secondary | ICD-10-CM | POA: Diagnosis not present

## 2024-02-18 DIAGNOSIS — I5022 Chronic systolic (congestive) heart failure: Secondary | ICD-10-CM | POA: Diagnosis not present

## 2024-02-18 DIAGNOSIS — Z7901 Long term (current) use of anticoagulants: Secondary | ICD-10-CM | POA: Diagnosis not present

## 2024-02-18 DIAGNOSIS — I959 Hypotension, unspecified: Secondary | ICD-10-CM | POA: Diagnosis not present

## 2024-02-18 DIAGNOSIS — I471 Supraventricular tachycardia, unspecified: Secondary | ICD-10-CM | POA: Diagnosis not present

## 2024-02-18 DIAGNOSIS — S02672D Fracture of alveolus of left mandible, subsequent encounter for fracture with routine healing: Secondary | ICD-10-CM | POA: Diagnosis not present

## 2024-02-18 DIAGNOSIS — S065X0D Traumatic subdural hemorrhage without loss of consciousness, subsequent encounter: Secondary | ICD-10-CM | POA: Diagnosis not present

## 2024-02-18 DIAGNOSIS — Z9221 Personal history of antineoplastic chemotherapy: Secondary | ICD-10-CM | POA: Diagnosis not present

## 2024-02-18 DIAGNOSIS — F32A Depression, unspecified: Secondary | ICD-10-CM | POA: Diagnosis not present

## 2024-02-18 DIAGNOSIS — Z9011 Acquired absence of right breast and nipple: Secondary | ICD-10-CM | POA: Diagnosis not present

## 2024-02-18 DIAGNOSIS — Z86718 Personal history of other venous thrombosis and embolism: Secondary | ICD-10-CM | POA: Diagnosis not present

## 2024-02-18 DIAGNOSIS — Z8701 Personal history of pneumonia (recurrent): Secondary | ICD-10-CM | POA: Diagnosis not present

## 2024-02-18 DIAGNOSIS — Z86011 Personal history of benign neoplasm of the brain: Secondary | ICD-10-CM | POA: Diagnosis not present

## 2024-02-18 DIAGNOSIS — R569 Unspecified convulsions: Secondary | ICD-10-CM | POA: Diagnosis not present

## 2024-02-18 DIAGNOSIS — K219 Gastro-esophageal reflux disease without esophagitis: Secondary | ICD-10-CM | POA: Diagnosis not present

## 2024-02-18 DIAGNOSIS — R001 Bradycardia, unspecified: Secondary | ICD-10-CM | POA: Diagnosis not present

## 2024-02-18 DIAGNOSIS — N183 Chronic kidney disease, stage 3 unspecified: Secondary | ICD-10-CM | POA: Diagnosis not present

## 2024-02-18 DIAGNOSIS — Z853 Personal history of malignant neoplasm of breast: Secondary | ICD-10-CM | POA: Diagnosis not present

## 2024-02-18 DIAGNOSIS — E785 Hyperlipidemia, unspecified: Secondary | ICD-10-CM | POA: Diagnosis not present

## 2024-02-18 DIAGNOSIS — Z923 Personal history of irradiation: Secondary | ICD-10-CM | POA: Diagnosis not present

## 2024-02-18 DIAGNOSIS — I480 Paroxysmal atrial fibrillation: Secondary | ICD-10-CM | POA: Diagnosis not present

## 2024-02-18 DIAGNOSIS — I428 Other cardiomyopathies: Secondary | ICD-10-CM | POA: Diagnosis not present

## 2024-02-18 DIAGNOSIS — Z8611 Personal history of tuberculosis: Secondary | ICD-10-CM | POA: Diagnosis not present

## 2024-02-18 DIAGNOSIS — F419 Anxiety disorder, unspecified: Secondary | ICD-10-CM | POA: Diagnosis not present

## 2024-02-18 MED ORDER — MIDODRINE HCL 5 MG PO TABS: 5.0000 mg | ORAL_TABLET | Freq: Three times a day (TID) | ORAL | 6 refills | Status: DC

## 2024-02-18 NOTE — Telephone Encounter (Signed)
 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 Consider removing the following QT Prolonging medication(s). For more information, providers can review medications that prolong the QT interval at [email or web address] (registration required).   -will confirm with provider if ok to refill

## 2024-02-22 ENCOUNTER — Encounter (HOSPITAL_COMMUNITY): Payer: Medicare Other

## 2024-02-23 ENCOUNTER — Ambulatory Visit (INDEPENDENT_AMBULATORY_CARE_PROVIDER_SITE_OTHER)

## 2024-02-23 VITALS — BP 120/70 | Ht <= 58 in | Wt 139.0 lb

## 2024-02-23 DIAGNOSIS — Z780 Asymptomatic menopausal state: Secondary | ICD-10-CM

## 2024-02-23 DIAGNOSIS — I428 Other cardiomyopathies: Secondary | ICD-10-CM | POA: Diagnosis not present

## 2024-02-23 DIAGNOSIS — Z86011 Personal history of benign neoplasm of the brain: Secondary | ICD-10-CM | POA: Diagnosis not present

## 2024-02-23 DIAGNOSIS — N183 Chronic kidney disease, stage 3 unspecified: Secondary | ICD-10-CM | POA: Diagnosis not present

## 2024-02-23 DIAGNOSIS — Z86718 Personal history of other venous thrombosis and embolism: Secondary | ICD-10-CM | POA: Diagnosis not present

## 2024-02-23 DIAGNOSIS — I5022 Chronic systolic (congestive) heart failure: Secondary | ICD-10-CM | POA: Diagnosis not present

## 2024-02-23 DIAGNOSIS — Z860100 Personal history of colon polyps, unspecified: Secondary | ICD-10-CM | POA: Diagnosis not present

## 2024-02-23 DIAGNOSIS — F419 Anxiety disorder, unspecified: Secondary | ICD-10-CM | POA: Diagnosis not present

## 2024-02-23 DIAGNOSIS — Z7901 Long term (current) use of anticoagulants: Secondary | ICD-10-CM | POA: Diagnosis not present

## 2024-02-23 DIAGNOSIS — Z9221 Personal history of antineoplastic chemotherapy: Secondary | ICD-10-CM | POA: Diagnosis not present

## 2024-02-23 DIAGNOSIS — Z853 Personal history of malignant neoplasm of breast: Secondary | ICD-10-CM | POA: Diagnosis not present

## 2024-02-23 DIAGNOSIS — S065X0D Traumatic subdural hemorrhage without loss of consciousness, subsequent encounter: Secondary | ICD-10-CM | POA: Diagnosis not present

## 2024-02-23 DIAGNOSIS — Z8611 Personal history of tuberculosis: Secondary | ICD-10-CM | POA: Diagnosis not present

## 2024-02-23 DIAGNOSIS — I959 Hypotension, unspecified: Secondary | ICD-10-CM | POA: Diagnosis not present

## 2024-02-23 DIAGNOSIS — Z0000 Encounter for general adult medical examination without abnormal findings: Secondary | ICD-10-CM | POA: Diagnosis not present

## 2024-02-23 DIAGNOSIS — R001 Bradycardia, unspecified: Secondary | ICD-10-CM | POA: Diagnosis not present

## 2024-02-23 DIAGNOSIS — F32A Depression, unspecified: Secondary | ICD-10-CM | POA: Diagnosis not present

## 2024-02-23 DIAGNOSIS — Z923 Personal history of irradiation: Secondary | ICD-10-CM | POA: Diagnosis not present

## 2024-02-23 DIAGNOSIS — R569 Unspecified convulsions: Secondary | ICD-10-CM | POA: Diagnosis not present

## 2024-02-23 DIAGNOSIS — I480 Paroxysmal atrial fibrillation: Secondary | ICD-10-CM | POA: Diagnosis not present

## 2024-02-23 DIAGNOSIS — K449 Diaphragmatic hernia without obstruction or gangrene: Secondary | ICD-10-CM | POA: Diagnosis not present

## 2024-02-23 DIAGNOSIS — K219 Gastro-esophageal reflux disease without esophagitis: Secondary | ICD-10-CM | POA: Diagnosis not present

## 2024-02-23 DIAGNOSIS — Z1211 Encounter for screening for malignant neoplasm of colon: Secondary | ICD-10-CM

## 2024-02-23 DIAGNOSIS — Z8701 Personal history of pneumonia (recurrent): Secondary | ICD-10-CM | POA: Diagnosis not present

## 2024-02-23 DIAGNOSIS — Z9011 Acquired absence of right breast and nipple: Secondary | ICD-10-CM | POA: Diagnosis not present

## 2024-02-23 DIAGNOSIS — I471 Supraventricular tachycardia, unspecified: Secondary | ICD-10-CM | POA: Diagnosis not present

## 2024-02-23 DIAGNOSIS — E785 Hyperlipidemia, unspecified: Secondary | ICD-10-CM | POA: Diagnosis not present

## 2024-02-23 DIAGNOSIS — S02672D Fracture of alveolus of left mandible, subsequent encounter for fracture with routine healing: Secondary | ICD-10-CM | POA: Diagnosis not present

## 2024-02-23 NOTE — Progress Notes (Signed)
 Subjective:   Tara [name] is a 78 y.o. who presents for a Medicare Wellness preventive visit.  As a reminder, Annual Wellness Visits don't include a physical exam, and some assessments may be limited, especially if this visit is performed virtually. We may recommend an in-person follow-up visit with your provider if needed.  Visit Complete: Virtual I connected with  SHENELLE KLAS on 02/23/24 by a audio enabled telemedicine application and verified that I am speaking with the correct person using two identifiers.  Patient Location: Home  Provider Location: Home Office  I discussed the limitations of evaluation and management by telemedicine. The patient expressed understanding and agreed to proceed.  Vital Signs: Because this visit was a virtual/telehealth visit, some criteria may be missing or patient reported. Any vitals not documented were not able to be obtained and vitals that have been documented are patient reported.  VideoDeclined- This patient declined Librarian, academic. Therefore the visit was completed with audio only.  Persons Participating in Visit: Patient.  AWV Questionnaire: No: Patient Medicare AWV questionnaire was not completed prior to this visit.  Cardiac Risk Factors include: advanced age (>5men, >15 women);dyslipidemia;hypertension;Other (see comment), Risk factor comments: SVT     Objective:     Today's Vitals   02/23/24 1437  BP: 120/70  Weight: 139 lb (63 kg)  Height: 4\' 9"  (1.448 m)   Body mass index is 30.08 kg/m.     02/23/2024    2:58 PM 01/14/2024   11:10 AM 10/29/2023    1:17 PM 08/17/2023   11:13 AM 08/06/2023    6:43 AM 06/24/2023    7:32 PM 06/19/2023   11:24 PM  Advanced Directives  Does Patient Have a Medical Advance Directive? No No No No Yes No Yes  Type of Agricultural consultant;Living will  Healthcare Power of Attorney  Does patient want to make changes to medical advance  directive?    No - Patient declined   No - Patient declined  Copy of Healthcare Power of Attorney in Chart?     No - copy requested  No - copy requested  Would patient like information on creating a medical advance directive? No - Patient declined No - Patient declined No - Patient declined No - Patient declined       Current Medications (verified) Outpatient Encounter Medications as of 02/23/2024  Medication Sig   acetaminophen  (TYLENOL ) 500 MG tablet Take 1,000 mg by mouth every 6 (six) hours as needed for moderate pain (pain score 4-6) or mild pain (pain score 1-3).   apixaban  (ELIQUIS ) 5 MG TABS tablet Take 1 tablet (5 mg total) by mouth 2 (two) times daily.   bisacodyl  5 MG EC tablet One tablet every 3 days, if needed, for constipation (Patient taking differently: Take 5 mg by mouth daily as needed for moderate constipation or mild constipation.)   clonazePAM  (KLONOPIN ) 0.5 MG tablet Take 1 tablet (0.5 mg total) by mouth at bedtime.   docusate sodium  (COLACE) 100 MG capsule Take 100 mg by mouth 2 (two) times daily. As needed   fluticasone  (FLONASE ) 50 MCG/ACT nasal spray Place 2 sprays into both nostrils daily. (Patient taking differently: Place 2 sprays into both nostrils daily as needed for rhinitis or allergies.)   furosemide  (LASIX ) 20 MG tablet Take 1 tablet (20 mg total) by mouth daily as needed (for wt gain > 3 lb in 24h or > 5 lb in 1wk).  hydrocortisone  (ANUSOL -HC) 2.5 % rectal cream APPLY RECTALLY TWICE DAILY. (Patient taking differently: Place 1 Application rectally 2 (two) times daily as needed for hemorrhoids or anal itching.)   hydrOXYzine  (ATARAX ) 10 MG tablet Take one tablet at bedtime for itching (Patient taking differently: Take 10 mg by mouth at bedtime.)   levETIRAcetam  (KEPPRA ) 500 MG tablet Take 1 tablet (500 mg total) by mouth 2 (two) times daily.   midodrine  (PROAMATINE ) 5 MG tablet Take 1 tablet (5 mg total) by mouth 3 (three) times daily with meals.   montelukast   (SINGULAIR ) 10 MG tablet Take 1 tablet (10 mg total) by mouth at bedtime.   oxyCODONE  (OXY IR/ROXICODONE ) 5 MG immediate release tablet Take 0.5-1 tablets (2.5-5 mg total) by mouth every 4 (four) hours as needed for severe pain (pain score 7-10).   pantoprazole  (PROTONIX ) 40 MG tablet TAKE ONE TABLET BY MOUTH TWICE DAILY   polyethylene glycol (MIRALAX ) 17 g packet Take one packet mira lax once daily in 8 ounces water  (Patient taking differently: Take 17 g by mouth daily as needed for moderate constipation, mild constipation or severe constipation.)   potassium chloride  SA (KLOR-CON  M) 20 MEQ tablet Take 1 tablet (20 mEq total) by mouth daily as needed (when take furosemide ).   sertraline  (ZOLOFT ) 25 MG tablet TAKE 1 TABLET BY MOUTH ONCE A DAY.   [Paused] amiodarone  (PACERONE ) 200 MG tablet Take 1 tablet (200 mg total) by mouth daily. (Patient not taking: Reported on 02/23/2024)   [Paused] rosuvastatin  (CRESTOR ) 10 MG tablet Take 1 tablet (10 mg total) by mouth daily. (Patient not taking: Reported on 02/23/2024)   [Paused] spironolactone  (ALDACTONE ) 25 MG tablet Take 0.5 tablets (12.5 mg total) by mouth daily. (Patient not taking: Reported on 02/23/2024)   No facility-administered encounter medications on file as of 02/23/2024.    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   Angio-edema    Anxiety    Breast cancer (HCC) 2008   right - s/p lumpectomy->chemo, radiation   Depression    Dysrhythmia    hx SVT   Family history of colon cancer    Family history of prostate cancer    GERD (gastroesophageal reflux disease)    H/O: hysterectomy    Hiatal hernia    History of cancer chemotherapy    History of radiation therapy    Hyperlipidemia    Hypertension 01/25/2018   Nonischemic cardiomyopathy (HCC)    Personal history of  radiation therapy 01/05/209   SVT (supraventricular tachycardia) (HCC)    short RP SVT documented 5/14   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HCC)    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)   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   Past Surgical History:  Procedure Laterality Date   ABDOMINAL HYSTERECTOMY  1994   fibroids,    BIOPSY  06/19/2016   Procedure: BIOPSY;  Surgeon: Suzette Espy, MD;  Location: [facility];  Service: Endoscopy;;  gastric duodenum   BREAST BIOPSY Right 12/16/2006   malignant   BREAST EXCISIONAL BIOPSY Right 2018   benign lumpectomy   BREAST LUMPECTOMY Right 02/2007   BREAST LUMPECTOMY WITH RADIOACTIVE SEED LOCALIZATION Right 07/29/2017   Procedure: RIGHT BREAST LUMPECTOMY WITH RADIOACTIVE SEED LOCALIZATION;  Surgeon: Boyce Byes, MD;  Location: [facility] OR;  Service: General;  Laterality: Right;   BREAST SURGERY Right 2008   lumpectomy, cancer   CARDIAC CATHETERIZATION     CARDIOVERSION N/A 08/06/2023   Procedure: CARDIOVERSION (CATH LAB);  Surgeon: Darlis Eisenmenger, MD;  Location: [facility] INVASIVE CV LAB;  Service: Cardiovascular;  Laterality: N/A;   CHOLECYSTECTOMY  1999   COLONOSCOPY  2008   Dr. Nolene Baumgarten: multiple polyps. Path not available at time of visit.    COLONOSCOPY WITH PROPOFOL  N/A 04/25/2014   Dr. Nolene Baumgarten: Multiple tubular adenomas removed. Diverticulosis. Moderate internal hemorrhoids. Next colonoscopy planned for August 2018.   CRANIOTOMY Bilateral 03/24/2022   Procedure: Bifrontal craniotomy for resection of meningioma;  Surgeon: Audie Bleacher, MD;  Location: [facility] OR;  Service: Neurosurgery;  Laterality: Bilateral;   ESOPHAGOGASTRODUODENOSCOPY (EGD) WITH PROPOFOL  N/A 06/19/2016   Procedure: ESOPHAGOGASTRODUODENOSCOPY (EGD) WITH PROPOFOL ;  Surgeon: Suzette Espy, MD;  Location: [facility];  Service: Endoscopy;  Laterality: N/A;   MASTECTOMY W/ SENTINEL NODE BIOPSY Right 06/09/2018   Procedure: RIGHT TOTAL  MASTECTOMY WITH SENTINEL LYMPH NODE BIOPSY;  Surgeon: Boyce Byes, MD;  Location: [facility] OR;  Service: General;  Laterality: Right;   POLYPECTOMY N/A 04/25/2014   Procedure: POLYPECTOMY;  Surgeon: Alyce Jubilee, MD;  Location: [facility] ORS;  Service: Endoscopy;  Laterality: N/A;  Ascending and Decending Colon x3 , Transverse colon x2, rectal   PORTACATH PLACEMENT N/A 06/09/2018   Procedure: INSERTION PORT-A-CATH;  Surgeon: Boyce Byes, MD;  Location: [facility] OR;  Service: General;  Laterality: N/A;   RIGHT/LEFT HEART CATH AND CORONARY ANGIOGRAPHY N/A 07/10/2017   Procedure: RIGHT/LEFT HEART CATH AND CORONARY ANGIOGRAPHY;  Surgeon: Darlis Eisenmenger, MD;  Location: [facility] INVASIVE CV LAB;  Service: Cardiovascular;  Laterality: N/A;   Family History  Problem Relation Age of Onset   Dementia Mother    Stroke Mother 61       left hemiparesis   Colon cancer Father 43       died at age 24   Breast cancer Sister    Hypertension Sister    Heart disease Sister    Cancer Sister        unknown form   Cancer Paternal Aunt        NOS   Other Paternal Aunt        old age   Lung cancer Paternal Uncle    Other Paternal Uncle        lightning strike   Other Paternal Uncle        hit by train   Cancer Cousin        NOS pat first cousin   Cancer Cousin        NOS pat first cousin   Prostate cancer Cousin        pat first cousin   Cancer Brother 43       prostate   Social History   Socioeconomic History   Marital status: Married    Spouse name: Not on file   Number of children: 2   Years of education: Not on file   Highest education level: Not on file  Occupational History   Occupation: disabled     Employer: UNEMPLOYED  Tobacco Use   Smoking status: Never   Smokeless tobacco: Never  Vaping Use   Vaping status: Never Used  Substance and Sexual Activity   Alcohol use: No   Drug use: No   Sexual activity: Not on file  Other Topics Concern   Not on file  Social  History Narrative   Lives in  [place] with family.  Does not routinely exercise. Right handed   Caffeine-none   Social Drivers of Health   Financial Resource Strain: Low Risk  (02/23/2024)   Overall Financial Resource Strain (CARDIA)    Difficulty of Paying Living Expenses: Not hard at all  Food Insecurity: No Food Insecurity (02/23/2024)   Hunger Vital Sign    Worried About Running Out of Food in the Last Year: Never true    Ran Out of Food in the Last Year: Never true  Transportation Needs: No Transportation Needs (02/23/2024)   PRAPARE - Administrator, Civil Service (Medical): No    Lack of Transportation (Non-Medical): No  Physical Activity: Insufficiently Active (02/23/2024)   Exercise Vital Sign    Days of Exercise per Week: 3 days    Minutes of Exercise per Session: 30 min  Stress: No Stress Concern Present (02/23/2024)   [employer] of Occupational Health - Occupational Stress Questionnaire    Feeling of Stress : Not at all  Social Connections: Moderately Integrated (02/23/2024)   Social Connection and Isolation Panel [NHANES]    Frequency of Communication with Friends and Family: More than three times a week    Frequency of Social Gatherings with Friends and Family: More than three times a week    Attends Religious Services: More than 4 times per year    Active Member of [employer] or Organizations: No    Attends Engineer, structural: Never    Marital Status: Married    Tobacco Counseling Counseling given: Yes    Clinical Intake:  Pre-visit preparation completed: Yes  Pain : No/denies pain     BMI - recorded: 30.08 Nutritional Status: BMI > 30  Obese Nutritional Risks: None Diabetes: No  Lab Results  Component Value Date   HGBA1C 6.2 (H) 06/03/2023   HGBA1C 6.1 (H) 12/31/2022   HGBA1C 5.9 (H) 04/18/2022     How often do you need to have someone help you when you read instructions, pamphlets, or other written materials from your doctor or pharmacy?: 1 -  Never  Interpreter Needed?: No  Information entered by :: Sally Crazier CMA   Activities of Daily Living     02/23/2024    2:53 PM 01/14/2024    9:00 PM  In your present state of health, do you have any difficulty performing the following activities:  Hearing? 0 0  Vision? 0 0  Difficulty concentrating or making decisions? 0 0  Walking or climbing stairs? 1   Dressing or bathing? 1   Doing errands, shopping? 1 1  Preparing Food and eating ? N   Using the Toilet? N   In the past six months, have you accidently leaked urine? N   Do you have problems with loss of bowel control? N   Managing your Medications? N   Managing your Finances? N   Housekeeping or managing your Housekeeping? Y     Patient Care Team: Towanda Fret, MD as PCP - General Cottle, Kennedy Peabody., MD as Attending Physician (Psychiatry) Riley Cheadle, Windsor Hatcher, MD as Consulting Physician (Gastroenterology) Windell Hasty, MD (Nephrology) Idolina Maker Mirna Amis, MD as Consulting Physician (Allergy  and Immunology) Otho Blitz, MD as Referring Physician (Internal Medicine) Pleasant Brilliant, MD as Attending Physician (Orthopedic Surgery) Darlis Eisenmenger, MD as Consulting Physician (Cardiology) Devin Foerster, MD as Consulting Physician (Ophthalmology) Cassandra Cleveland, MD as Consulting Physician (Neurology) Sheryl Donna, NP as Nurse Practitioner (  Internal Medicine)  I have updated your Care Teams any recent Medical Services you may have received from other providers in the past year.     Assessment:    This is a routine wellness examination for Cheney.  Hearing/Vision screen Hearing Screening - Comments:: Patient denies any hearing difficulties.   Vision Screening - Comments:: Wears rx glasses - up to date with routine eye exams with  Norlene Beavers   Goals Addressed             This Visit's Progress    Patient Stated       I want to be up and walking and taking care of my family        Depression Screen     02/23/2024    2:52 PM 01/22/2024   10:34 AM 10/29/2023    3:04 PM 08/17/2023   12:54 PM 08/11/2023    1:51 PM 08/05/2023    4:17 PM 08/05/2023    1:55 PM  PHQ 2/9 Scores  PHQ - 2 Score 0 0 2 1 0 0 0  PHQ- 9 Score 0  7 3       Fall Risk     02/23/2024    2:54 PM 01/22/2024   10:34 AM 01/21/2024    1:34 PM 10/29/2023    1:16 PM 08/17/2023   11:13 AM  Fall Risk   Falls in the past year? 1 1 1  0 1  Number falls in past yr: 1 0 1 0 0  Injury with Fall? 1 1 1  0 0  Risk for fall due to : History of fall(s);Impaired balance/gait;Orthopedic patient  History of fall(s);Impaired balance/gait;Impaired mobility Impaired balance/gait;Impaired mobility Impaired balance/gait;Impaired mobility  Follow up Falls evaluation completed;Education provided;Falls prevention discussed Falls evaluation completed  Falls prevention discussed Falls evaluation completed    MEDICARE RISK AT HOME:  Medicare Risk at Home Any stairs in or around the home?: No If so, are there any without handrails?: No Home free of loose throw rugs in walkways, pet beds, electrical cords, etc?: Yes Adequate lighting in your home to reduce risk of falls?: Yes Life alert?: No Use of a cane, walker or w/c?: Yes Grab bars in the bathroom?: Yes Shower chair or bench in shower?: Yes Elevated toilet seat or a handicapped toilet?: Yes  TIMED UP AND GO:  Was the test performed?  No  Cognitive Function: 6CIT completed    05/01/2021   11:27 AM  MMSE - Mini Mental State Exam  Not completed: Unable to complete        02/23/2024    2:58 PM 05/01/2021   11:28 AM 04/23/2020    1:32 PM 04/05/2019   10:00 AM 04/05/2019    9:55 AM  6CIT Screen  What Year? 0 points 0 points 0 points 0 points 0 points  What month? 0 points 0 points 0 points  0 points  What time? 0 points 0 points 0 points 0 points 0 points  Count back from 20 0 points 0 points 0 points 0 points 0 points  Months in reverse 0 points 0 points 0 points 0  points 0 points  Repeat phrase 0 points 0 points 0 points 0 points 0 points  Total Score 0 points 0 points 0 points  0 points    Immunizations Immunization History  Administered Date(s) Administered   Fluad Quad(high Dose 65+) 08/08/2019, 08/07/2020, 05/28/2021, 08/15/2022   Fluad Trivalent(High Dose 65+) 07/22/2023   PFIZER(Purple Top)SARS-COV-2 Vaccination 12/08/2019, 01/02/2020,  09/13/2020   Pneumococcal Conjugate-13 12/11/2014   Pneumococcal Polysaccharide-23 07/03/2016   Tdap 01/14/2024   Zoster Recombinant(Shingrix) 11/18/1995    Screening Tests Health Maintenance  Topic Date Due   Zoster Vaccines- Shingrix (2 of 2) 01/13/1996   DEXA SCAN  02/15/2016   Colonoscopy  04/27/2019   COVID-19 Vaccine (4 - 2024-25 season) 05/24/2023   MAMMOGRAM  02/12/2024   INFLUENZA VACCINE  04/22/2024   DTaP/Tdap/Td (2 - Td or Tdap) 01/13/2034   Pneumonia Vaccine 60+ Years old  Completed   Hepatitis C Screening  Completed   HPV VACCINES  Aged Out   Meningococcal B Vaccine  Aged Out    Health Maintenance  Health Maintenance Due  Topic Date Due   Zoster Vaccines- Shingrix (2 of 2) 01/13/1996   DEXA SCAN  02/15/2016   Colonoscopy  04/27/2019   COVID-19 Vaccine (4 - 2024-25 season) 05/24/2023   MAMMOGRAM  02/12/2024   Health Maintenance Items Addressed: DEXA ordered, Referral sent to GI for colonoscopy  Additional Screening:  Vision Screening: Recommended annual ophthalmology exams for early detection of glaucoma and other disorders of the eye. Would you like a referral to an eye doctor? No    Dental Screening: Recommended annual dental exams for proper oral hygiene  Community Resource Referral / Chronic Care Management: CRR required this visit?  No   CCM required this visit?  No   Plan:    I have personally reviewed and noted the following in the patient's chart:   Medical and social history Use of alcohol, tobacco or illicit drugs  Current medications and supplements  including opioid prescriptions. Patient is currently taking opioid prescriptions. Information provided to patient regarding non-opioid alternatives. Patient advised to discuss non-opioid treatment plan with their provider. Functional ability and status Nutritional status Physical activity Advanced directives List of other physicians Hospitalizations, surgeries, and ER visits in previous 12 months Vitals Screenings to include cognitive, depression, and falls Referrals and appointments  In addition, I have reviewed and discussed with patient certain preventive protocols, quality metrics, and best practice recommendations. A written personalized care plan for preventive services as well as general preventive health recommendations were provided to patient.   Thessaly Mccullers, CMA   02/23/2024   After Visit Summary: (MyChart) Due to this being a telephonic visit, the after visit summary with patients personalized plan was offered to patient via MyChart   Notes: Please refer to Routing Comments.

## 2024-02-23 NOTE — Patient Instructions (Signed)
 [name] , Thank you for taking time out of your busy schedule to complete your Annual Wellness Visit with me. I enjoyed our conversation and look forward to speaking with you again next year. I, as well as your care team,  appreciate your ongoing commitment to your health goals. Please review the following plan we discussed and let me know if I can assist you in the future.  Your Game plan/ To Do List     Referrals: If you haven't heard from the office you've been referred to, please reach out to them at the phone number provided.  Colonoscopy Referral [facility] at  [address] Phone: [phone]   An order for a bone density scan was placed for you today Please call the number below to schedule your appt. [facility] at [facility] Phone: [phone] Make sure to wear two-piece clothing.  If you're having a mammogram, please remember: No lotions,powders, or deodorants the day of the appointment Make sure to bring picture ID and insurance card.  Bring list of medications you are currently taking including any supplements.  If you are having a bone density scan, please discontinue any medications that contain calcium  at least 48 hours (2 days) prior to your scan.   Follow up Visits:  Next Medicare AWV with our clinical staff:   February 27, 2025 at 10:40 am telephone visit  Have you seen your provider in the last 6 months (3 months if uncontrolled diabetes)? Yes  Next Office Visit with your provider:   April 20, 2024 at 10:00 am with Dr. Rodolph Clap  Clinician Recommendations:    Aim for 30 minutes of exercise or brisk walking, 6-8 glasses of water , and 5 servings of fruits and vegetables each day.   I enjoyed our conversation today and look forward to talking with you again next year!! Have a wonderful and safe year. All the best, Tara [name]      This is a list of the screening recommended for you and due dates:  Health  Maintenance  Topic Date Due   Zoster (Shingles) Vaccine (2 of 2) 01/13/1996   DEXA scan (bone density measurement)  02/15/2016   Colon Cancer Screening  04/27/2019   COVID-19 Vaccine (4 - 2024-25 season) 05/24/2023   Mammogram  02/12/2024   Flu Shot  04/22/2024   DTaP/Tdap/Td vaccine (2 - Td or Tdap) 01/13/2034   Pneumonia Vaccine  Completed   Hepatitis C Screening  Completed   HPV Vaccine  Aged Out   Meningitis B Vaccine  Aged Out    Advanced directives: (Declined) Advance directive discussed with you today. Even though you declined this today, please call our office should you change your mind, and we can give you the proper paperwork for you to fill out. Advance Care Planning is important because it:  [x]  Makes sure you receive the medical care that is consistent with your values, goals, and preferences  [x]  It provides guidance to your family and loved ones and reduces their decisional burden about whether or not they are making the right decisions based on your wishes.  Follow the link provided in your after visit summary or read over the paperwork we have mailed to you to help you started getting your Advance Directives in place. If you need assistance in completing these, please reach out to us  so that we can help you!  See attachments for Preventive Care and Fall Prevention Tips.  Understanding Your Risk for Falls  Millions of people have serious injuries from falls each year. It is important to understand your risk of falling. Talk with your health care provider about your risk and what you can do to lower it. If you do have a serious fall, make sure to tell your provider. Falling once raises your risk of falling again. How can falls affect me? Serious injuries from falls are common. These include: Broken bones, such as hip fractures. Head injuries, such as traumatic brain injuries (TBI) or concussions. A fear of falling can cause you to avoid activities and stay at home.  This can make your muscles weaker and raise your risk for a fall. What can increase my risk? There are a number of risk factors that increase your risk for falling. The more risk factors you have, the higher your risk of falling. Serious injuries from a fall happen most often to people who are older than 78 years old. Teenagers and young adults ages 72-29 are also at higher risk. Common risk factors include: Weakness in the lower body. Being generally weak or confused due to long-term (chronic) illness. Dizziness or balance problems. Poor vision. Medicines that cause dizziness or drowsiness. These may include: Medicines for your blood pressure, heart, anxiety, insomnia, or swelling (edema). Pain medicines. Muscle relaxants. Other risk factors include: Drinking alcohol. Having had a fall in the past. Having foot pain or wearing improper footwear. Working at a dangerous job. Having any of the following in your home: Tripping hazards, such as floor clutter or loose rugs. Poor lighting. Pets. Having dementia or memory loss. What actions can I take to lower my risk of falling?     Physical activity Stay physically fit. Do strength and balance exercises. Consider taking a regular class to build strength and balance. Yoga and tai chi are good options. Vision Have your eyes checked every year and your prescription for glasses or contacts updated as needed. Shoes and walking aids Wear non-skid shoes. Wear shoes that have rubber soles and low heels. Do not wear high heels. Do not walk around the house in socks or slippers. Use a cane or walker as told by your provider. Home safety Attach secure railings on both sides of your stairs. Install grab bars for your bathtub, shower, and toilet. Use a non-skid mat in your bathtub or shower. Attach bath mats securely with double-sided, non-slip rug tape. Use good lighting in all rooms. Keep a flashlight near your bed. Make sure there is a clear  path from your bed to the bathroom. Use night-lights. Do not use throw rugs. Make sure all carpeting is taped or tacked down securely. Remove all clutter from walkways and stairways, including extension cords. Repair uneven or broken steps and floors. Avoid walking on icy or slippery surfaces. Walk on the grass instead of on icy or slick sidewalks. Use ice melter to get rid of ice on walkways in the winter. Use a cordless phone. Questions to ask your health care provider Can you help me check my risk for a fall? Do any of my medicines make me more likely to fall? Should I take a vitamin D  supplement? What exercises can I do to improve my strength and balance? Should I make an appointment to have my vision checked? Do I need a bone density test to check for weak bones (osteoporosis)? Would it help to use a cane or a walker? Where to find more information Centers for Disease Control and Prevention,  STEADI: TonerPromos.no Community-Based Fall Prevention Programs: TonerPromos.no [employer] on Aging: BaseRingTones.pl Contact a health care provider if: You fall at home. You are afraid of falling at home. You feel weak, drowsy, or dizzy. This information is not intended to replace advice given to you by your health care provider. Make sure you discuss any questions you have with your health care provider. Document Revised: 05/12/2022 Document Reviewed: 05/12/2022 Elsevier Patient Education  2024 Elsevier Inc.  Managing Pain Without Opioids Opioids are strong medicines used to treat moderate to severe pain. For some people, especially those who have long-term (chronic) pain, opioids may not be the best choice for pain management due to: Side effects like nausea, constipation, and sleepiness. The risk of addiction (opioid use disorder). The longer you take opioids, the greater your risk of addiction. Pain that lasts for more than 3 months is called chronic pain. Managing chronic pain usually requires more  than one approach and is often provided by a team of health care providers working together (multidisciplinary approach). Pain management may be done at a pain management center or pain clinic. How to manage pain without the use of opioids Use non-opioid medicines Non-opioid medicines for pain may include: Over-the-counter or prescription non-steroidal anti-inflammatory drugs (NSAIDs). These may be the first medicines used for pain. They work well for muscle and bone pain, and they reduce swelling. Acetaminophen . This over-the-counter medicine may work well for milder pain but not swelling. Antidepressants. These may be used to treat chronic pain. A certain type of antidepressant (tricyclics) is often used. These medicines are given in lower doses for pain than when used for depression. Anticonvulsants. These are usually used to treat seizures but may also reduce nerve (neuropathic) pain. Muscle relaxants. These relieve pain caused by sudden muscle tightening (spasms). You may also use a pain medicine that is applied to the skin as a patch, cream, or gel (topical analgesic), such as a numbing medicine. These may cause fewer side effects than medicines taken by mouth. Do certain therapies as directed Some therapies can help with pain management. They include: Physical therapy. You will do exercises to gain strength and flexibility. A physical therapist may teach you exercises to move and stretch parts of your body that are weak, stiff, or painful. You can learn these exercises at physical therapy visits and practice them at home. Physical therapy may also involve: Massage. Heat wraps or applying heat or cold to affected areas. Electrical signals that interrupt pain signals (transcutaneous electrical nerve stimulation, TENS). Weak lasers that reduce pain and swelling (low-level laser therapy). Signals from your body that help you learn to regulate pain (biofeedback). Occupational therapy. This helps  you to learn ways to function at home and work with less pain. Recreational therapy. This involves trying new activities or hobbies, such as a physical activity or drawing. Mental health therapy, including: Cognitive behavioral therapy (CBT). This helps you learn coping skills for dealing with pain. Acceptance and commitment therapy (ACT) to change the way you think and react to pain. Relaxation therapies, including muscle relaxation exercises and mindfulness-based stress reduction. Pain management counseling. This may be individual, family, or group counseling.  Receive medical treatments Medical treatments for pain management include: Nerve block injections. These may include a pain blocker and anti-inflammatory medicines. You may have injections: Near the spine to relieve chronic back or neck pain. Into joints to relieve back or joint pain. Into nerve areas that supply a painful area to relieve body pain. Into muscles (trigger  point injections) to relieve some painful muscle conditions. A medical device placed near your spine to help block pain signals and relieve nerve pain or chronic back pain (spinal cord stimulation device). Acupuncture. Follow these instructions at home Medicines Take over-the-counter and prescription medicines only as told by your health care provider. If you are taking pain medicine, ask your health care providers about possible side effects to watch out for. Do not drive or use heavy machinery while taking prescription opioid pain medicine. Lifestyle  Do not use drugs or alcohol to reduce pain. If you drink alcohol, limit how much you have to: 0-1 drink a day for women who are not pregnant. 0-2 drinks a day for men. Know how much alcohol is in a drink. In the [place] one drink equals one 12 oz bottle of beer (355 mL), one 5 oz glass of wine (148 mL), or one 1 oz glass of hard liquor (44 mL). Do not use any products that contain nicotine or tobacco. These  products include cigarettes, chewing tobacco, and vaping devices, such as e-cigarettes. If you need help quitting, ask your health care provider. Eat a healthy diet and maintain a healthy weight. Poor diet and excess weight may make pain worse. Eat foods that are high in fiber. These include fresh fruits and vegetables, whole grains, and beans. Limit foods that are high in fat and processed sugars, such as fried and sweet foods. Exercise regularly. Exercise lowers stress and may help relieve pain. Ask your health care provider what activities and exercises are safe for you. If your health care provider approves, join an exercise class that combines movement and stress reduction. Examples include yoga and tai chi. Get enough sleep. Lack of sleep may make pain worse. Lower stress as much as possible. Practice stress reduction techniques as told by your therapist. General instructions Work with all your pain management providers to find the treatments that work best for you. You are an important member of your pain management team. There are many things you can do to reduce pain on your own. Consider joining an online or in-person support group for people who have chronic pain. Keep all follow-up visits. This is important. Where to find more information You can find more information about managing pain without opioids from: American Academy of Pain Medicine: painmed.org Institute for Chronic Pain: instituteforchronicpain.org American Chronic Pain Association: theacpa.org Contact a health care provider if: You have side effects from pain medicine. Your pain gets worse or does not get better with treatments or home therapy. You are struggling with anxiety or depression. Summary Many types of pain can be managed without opioids. Chronic pain may respond better to pain management without opioids. Pain is best managed when you and a team of health care providers work together. Pain management  without opioids may include non-opioid medicines, medical treatments, physical therapy, mental health therapy, and lifestyle changes. Tell your health care providers if your pain gets worse or is not being managed well enough. This information is not intended to replace advice given to you by your health care provider. Make sure you discuss any questions you have with your health care provider. Document Revised: 12/19/2020 Document Reviewed: 12/19/2020 Elsevier Patient Education  2024 [employer]. Understanding Your Risk for Falls Millions of people have serious injuries from falls each year. It is important to understand your risk of falling. Talk with your health care provider about your risk and what you can do to lower it. If you do  have a serious fall, make sure to tell your provider. Falling once raises your risk of falling again. How can falls affect me? Serious injuries from falls are common. These include: Broken bones, such as hip fractures. Head injuries, such as traumatic brain injuries (TBI) or concussions. A fear of falling can cause you to avoid activities and stay at home. This can make your muscles weaker and raise your risk for a fall. What can increase my risk? There are a number of risk factors that increase your risk for falling. The more risk factors you have, the higher your risk of falling. Serious injuries from a fall happen most often to people who are older than 78 years old. Teenagers and young adults ages 55-29 are also at higher risk. Common risk factors include: Weakness in the lower body. Being generally weak or confused due to long-term (chronic) illness. Dizziness or balance problems. Poor vision. Medicines that cause dizziness or drowsiness. These may include: Medicines for your blood pressure, heart, anxiety, insomnia, or swelling (edema). Pain medicines. Muscle relaxants. Other risk factors include: Drinking alcohol. Having had a fall in the  past. Having foot pain or wearing improper footwear. Working at a dangerous job. Having any of the following in your home: Tripping hazards, such as floor clutter or loose rugs. Poor lighting. Pets. Having dementia or memory loss. What actions can I take to lower my risk of falling?     Physical activity Stay physically fit. Do strength and balance exercises. Consider taking a regular class to build strength and balance. Yoga and tai chi are good options. Vision Have your eyes checked every year and your prescription for glasses or contacts updated as needed. Shoes and walking aids Wear non-skid shoes. Wear shoes that have rubber soles and low heels. Do not wear high heels. Do not walk around the house in socks or slippers. Use a cane or walker as told by your provider. Home safety Attach secure railings on both sides of your stairs. Install grab bars for your bathtub, shower, and toilet. Use a non-skid mat in your bathtub or shower. Attach bath mats securely with double-sided, non-slip rug tape. Use good lighting in all rooms. Keep a flashlight near your bed. Make sure there is a clear path from your bed to the bathroom. Use night-lights. Do not use throw rugs. Make sure all carpeting is taped or tacked down securely. Remove all clutter from walkways and stairways, including extension cords. Repair uneven or broken steps and floors. Avoid walking on icy or slippery surfaces. Walk on the grass instead of on icy or slick sidewalks. Use ice melter to get rid of ice on walkways in the winter. Use a cordless phone. Questions to ask your health care provider Can you help me check my risk for a fall? Do any of my medicines make me more likely to fall? Should I take a vitamin D  supplement? What exercises can I do to improve my strength and balance? Should I make an appointment to have my vision checked? Do I need a bone density test to check for weak bones (osteoporosis)? Would it  help to use a cane or a walker? Where to find more information Centers for Disease Control and Prevention, STEADI: TonerPromos.no Community-Based Fall Prevention Programs: TonerPromos.no [employer] on Aging: BaseRingTones.pl Contact a health care provider if: You fall at home. You are afraid of falling at home. You feel weak, drowsy, or dizzy. This information is not intended to replace advice given to  you by your health care provider. Make sure you discuss any questions you have with your health care provider. Document Revised: 05/12/2022 Document Reviewed: 05/12/2022 Elsevier Patient Education  2024 [employer].

## 2024-02-24 ENCOUNTER — Encounter (HOSPITAL_COMMUNITY): Payer: Self-pay

## 2024-02-24 ENCOUNTER — Encounter (HOSPITAL_COMMUNITY): Payer: Medicare Other

## 2024-02-24 ENCOUNTER — Ambulatory Visit (HOSPITAL_COMMUNITY)

## 2024-02-24 NOTE — Therapy (Incomplete)
 OUTPATIENT PHYSICAL THERAPY LOWER EXTREMITY EVALUATION   Patient Name: Tara [name] MRN: [id number] DOB:03-20-1946, 78 y.o., female Today's Date: 02/24/2024  END OF SESSION:   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   Angio-edema    Anxiety    Breast cancer (HCC) 2008   right - s/p lumpectomy->chemo, radiation   Depression    Dysrhythmia    hx SVT   Family history of colon cancer    Family history of prostate cancer    GERD (gastroesophageal reflux disease)    H/O: hysterectomy    Hiatal hernia    History of cancer chemotherapy    History of radiation therapy    Hyperlipidemia    Hypertension 01/25/2018   Nonischemic cardiomyopathy (HCC)    Personal history of radiation therapy 01/05/209   SVT (supraventricular tachycardia) (HCC)    short RP SVT documented 5/14   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HCC)    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)   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 OR;  Service: General;  Laterality: Right;   BREAST SURGERY Right 2008   lumpectomy, cancer   CARDIAC CATHETERIZATION     CARDIOVERSION N/A 08/06/2023   Procedure: CARDIOVERSION (CATH LAB);  Surgeon: Darlis Eisenmenger, MD;  Location: [facility] INVASIVE CV LAB;  Service: Cardiovascular;  Laterality: N/A;    CHOLECYSTECTOMY  1999   COLONOSCOPY  2008   Dr. Nolene Baumgarten: multiple polyps. Path not available at time of visit.    COLONOSCOPY WITH PROPOFOL  N/A 04/25/2014   Dr. Nolene Baumgarten: Multiple tubular adenomas removed. Diverticulosis. Moderate internal hemorrhoids. Next colonoscopy planned for August 2018.   CRANIOTOMY Bilateral 03/24/2022   Procedure: Bifrontal craniotomy for resection of meningioma;  Surgeon: Audie Bleacher, MD;  Location: [facility] OR;  Service: Neurosurgery;  Laterality: Bilateral;   ESOPHAGOGASTRODUODENOSCOPY (EGD) WITH PROPOFOL  N/A 06/19/2016   Procedure: ESOPHAGOGASTRODUODENOSCOPY (EGD) WITH PROPOFOL ;  Surgeon: Suzette Espy, MD;  [facility]: [facility];  Service: Endoscopy;  Laterality: N/A;   MASTECTOMY W/ SENTINEL NODE BIOPSY Right 06/09/2018   Procedure: RIGHT TOTAL MASTECTOMY WITH SENTINEL LYMPH NODE BIOPSY;  Surgeon: Boyce Byes, MD;  Location: [facility] OR;  Service: General;  Laterality: Right;   POLYPECTOMY N/A 04/25/2014   Procedure: POLYPECTOMY;  Surgeon: Alyce Jubilee, MD;  Location: [facility];  Service: Endoscopy;  Laterality: N/A;  Ascending and Decending Colon x3 , Transverse colon x2, rectal   PORTACATH PLACEMENT N/A 06/09/2018   Procedure: INSERTION PORT-A-CATH;  Surgeon: Boyce Byes, MD;  Location: [facility] OR;  Service: General;  Laterality: N/A;   RIGHT/LEFT HEART CATH AND CORONARY ANGIOGRAPHY N/A 07/10/2017   Procedure: RIGHT/LEFT HEART CATH AND CORONARY ANGIOGRAPHY;  Surgeon: Darlis Eisenmenger, MD;  Location: [facility] INVASIVE CV LAB;  Service: Cardiovascular;  Laterality: N/A;  Patient Active Problem List   Diagnosis Date Noted   Encounter for removal of sutures 02/03/2024   Bilateral knee pain 01/22/2024   Fall 01/14/2024   Pain and swelling of left lower leg 08/12/2023   Acute pain of both knees 08/12/2023   Acute foot pain, left 08/12/2023   Elevated liver enzymes 08/02/2023   Encounter for immunization 08/02/2023   Encounter for Medicare annual examination with abnormal  findings 08/02/2023   Community acquired pneumonia of left lower lobe of lung 06/19/2023   Dysuria 06/03/2023   Impaired mobility and ADLs 01/05/2023   IGT (impaired glucose tolerance) 01/05/2023   Non-ischemic cardiomyopathy (HCC) 01/05/2023   Constipation 10/26/2022   Paroxysmal atrial fibrillation (HCC) 09/20/2022   Chronic anticoagulation 09/20/2022   Hiatal hernia 09/20/2022   Aspiration pneumonia (HCC) 08/14/2022   Seizure disorder (HCC)    Lethargy 05/15/2022   Dysfunction of right cardiac ventricle 05/07/2022   Acute postoperative anemia due to expected blood loss 05/07/2022   Dysphagia 05/07/2022   Palliative care encounter    Acute respiratory failure with hypoxia (HCC)    Aspiration into airway    Pressure injury of skin 04/07/2022   Meningioma (HCC) 03/24/2022   Seizure (HCC) 03/15/2022   Atrial fibrillation, chronic (HCC) 03/15/2022   Protein-calorie malnutrition, moderate (HCC) 03/15/2022   Atrial fibrillation with RVR (HCC)    Metabolic acidosis 02/27/2022   Anxiety and depression 02/27/2022   Meningioma, cerebral (HCC) 02/27/2022   Normocytic anemia 02/27/2022   Acute renal failure (ARF) (HCC) 02/27/2022   Hemorrhoids 01/04/2021   Ankle deformity, right 09/12/2020   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   Port-A-Cath in place 09/28/2018   Colon adenomas 08/06/2018   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 (HCC) 06/09/2018   Hypertension 01/25/2018   AKI (acute kidney injury) (HCC) 10/25/2016   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   Acute on chronic combined systolic and diastolic CHF (congestive heart failure) (HCC)    Demand ischemia (HCC)    Hypokalemia 09/29/2016   Prolonged Q-T interval on ECG 09/29/2016   Chronic kidney disease (CKD), stage III (moderate) (HCC) 09/29/2016   Leukocytosis 09/18/2016   Hospital discharge follow-up 07/06/2016   Abnormal CT scan, chest    Upper airway cough syndrome  vs Cough variant asthma   04/09/2016   H/O fall 03/23/2016   Chronic cough 03/17/2016   Seasonal allergies 08/08/2014   Family history of colon cancer 03/30/2014   Osteoporosis 02/14/2014   Metabolic syndrome X 02/05/2014   SVT (supraventricular tachycardia) (HCC) 03/14/2013   Chronic HFrEF (heart failure with reduced ejection fraction) (HCC) 03/14/2013   Vitamin D  deficiency 07/23/2010   Malignant neoplasm of right breast (HCC) 12/13/2007   Hyperlipidemia 12/13/2007   Anxiety state 12/13/2007   Depression, major, single episode, 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   GERD 12/13/2007    PCP: Towanda Fret, MD  REFERRING PROVIDER: Towanda Fret, MD  REFERRING DIAG: [phone] (ICD-10-CM) - Acute pain of both knees W19.XXXD (ICD-10-CM) - Fall, subsequent encounter  THERAPY DIAG:  No diagnosis found.  Rationale for Evaluation and Treatment: Rehabilitation  ONSET DATE: ***  SUBJECTIVE:   SUBJECTIVE STATEMENT: ***  PERTINENT HISTORY: *** PAIN:  Are you having pain? {OPRCPAIN:27236}  PRECAUTIONS: {Therapy precautions:24002}  RED FLAGS: {PT Red Flags:29287}   WEIGHT BEARING RESTRICTIONS: {Yes ***/No:24003}  FALLS:  Has patient fallen in last 6 months? {fallsyesno:27318}  LIVING ENVIRONMENT: Lives with: {OPRC lives with:25569::"lives with their family"} Lives in: {Lives in:25570} Stairs: {opstairs:27293}  Has following equipment at home: {Assistive devices:23999}  OCCUPATION: ***  PLOF: {PLOF:24004}  PATIENT GOALS: ***  NEXT MD VISIT: ***  OBJECTIVE:  Note: Objective measures were completed at Evaluation unless otherwise noted.  DIAGNOSTIC FINDINGS:  CLINICAL DATA:  Left knee pain after fall.   EXAM: LEFT KNEE - 1-2 VIEW   COMPARISON:  None Available.   FINDINGS: No evidence of fracture, dislocation, or joint effusion. No evidence of arthropathy. Minimal patellar spurring is noted. Soft tissues are unremarkable.   IMPRESSION: No acute abnormality  seen.   PATIENT SURVEYS:  LEFS {:PHR,OPRCLEFSTABLE}   COGNITION: Overall cognitive status: {cognition:24006}     SENSATION: {sensation:27233}  EDEMA:  {edema:24020}  MUSCLE LENGTH: Hamstrings: Right *** deg; Left *** deg Andy Bannister test: Right *** deg; Left *** deg  POSTURE: {posture:25561}  PALPATION: ***  LOWER EXTREMITY ROM:  Active ROM Right eval Left eval  Hip flexion    Hip extension    Hip abduction    Hip adduction    Hip internal rotation    Hip external rotation    Knee flexion    Knee extension    Ankle dorsiflexion    Ankle plantarflexion    Ankle inversion    Ankle eversion     (Blank rows = not tested)  LOWER EXTREMITY MMT:  MMT Right eval Left eval  Hip flexion    Hip extension    Hip abduction    Hip adduction    Hip internal rotation    Hip external rotation    Knee flexion    Knee extension    Ankle dorsiflexion    Ankle plantarflexion    Ankle inversion    Ankle eversion     (Blank rows = not tested)  LOWER EXTREMITY SPECIAL TESTS:  {LEspecialtests:26242}  FUNCTIONAL TESTS:  5 times sit to stand: *** 2 minute walk test: ***  GAIT: Distance walked: *** Assistive device utilized: {Assistive devices:23999} Level of assistance: {Levels of assistance:24026} Comments: ***                                                                                                                                TREATMENT DATE:  02/24/2024  Vitals: BP: HR: O2 sat: Evaluation: -ROM measured, Strength assessed, HEP prescribed, pt educated on prognosis, findings, and importance of HEP compliance if given.  Manual Therapy: -CPA of Lumbar Spinal segments L3-L5, grade II-III mobilizations -STM of Lumbar Paraspinal musculature  Therapeutic Exercise: -Supine bridges 2 sets of 10 reps, 3 second holds, symptomatic, pt cued for max hip extension -Standing 3 way hip 1 sets 10 reps, bilaterally, pt cued for upright trunk and maintaining of neutral  spine -Lateral stepping 3 laps 20 feet per lap, second 2 with RTB around ankles, pt cued for upright posture -Forward lunges, 1 set of 5 reps better performance going into RLE, pt cued for core activation and upright posture       PATIENT EDUCATION:  Education details: Pt was educated on findings of PT evaluation, prognosis, frequency of therapy visits and rationale, attendance policy, and HEP if given.   Person educated: {Person educated:25204} Education method: {Education Method:25205} Education comprehension: {Education Comprehension:25206}  HOME EXERCISE PROGRAM: ***  ASSESSMENT:  CLINICAL IMPRESSION: Patient is a 78 y.o. female who was seen today for physical therapy evaluation and treatment for M25.561,M25.562 (ICD-10-CM) - Acute pain of both knees W19.XXXD (ICD-10-CM) - Fall, subsequent encounter.   Patient demonstrates decreased LE strength, abnormal pain rating, and impaired balance. Patient also demonstrates difficulty with ambulation during today's session with decreased stride length and velocity noted. Patient also demonstrates ***. Patient requires ***. Patient would benefit from skilled physical therapy for increased endurance with ambulation, increased LE strength, and balance for improved gait quality, return to higher level of function with ADLs, and progress towards therapy goals.   OBJECTIVE IMPAIRMENTS: {opptimpairments:25111}.   ACTIVITY LIMITATIONS: {activitylimitations:27494}  PARTICIPATION LIMITATIONS: {participationrestrictions:25113}  PERSONAL FACTORS: {Personal factors:25162} are also affecting patient's functional outcome.   REHAB POTENTIAL: {rehabpotential:25112}  CLINICAL DECISION MAKING: {clinical decision making:25114}  EVALUATION COMPLEXITY: {Evaluation complexity:25115}   GOALS: Goals reviewed with patient? {yes/no:20286}  SHORT TERM GOALS: Target date: ***  Patient will demonstrate evidence of independence with individualized HEP and  will report compliance for at least 3 days per week for optimized progression towards remaining therapy goals. Baseline: *** Goal status: {GOALSTATUS:25110}  2.  Patient will report a decrease in pain level during community ambulation by at least 2 points for improved quality of life. Baseline: *** Goal status: {GOALSTATUS:25110}     LONG TERM GOALS: Target date: ***  Pt will demonstrate a an increase of at least 9 points on the LEFS for improved performance of community ambulation and ADL. Baseline: *** Goal status: {GOALSTATUS:25110}  2.  Pt will improve 2 MWT by *** in order to demonstrate improved functional ambulatory capacity in community setting.  Baseline: *** Goal status: {GOALSTATUS:25110}  3.  Pt will demonstrate WFL ROM (flexion and extension) in right knee, for increased mobility and maximal efficiency of gait cycle during ambulation. Baseline: *** Goal status: {GOALSTATUS:25110}  4.  Pt will demonstrate at least 4-/5 MMT for right lower extremity for increased strength during ADL and community ambulation. Baseline: *** Goal status: {GOALSTATUS:25110}  5.  Pt will improve *** by *** in order to improve *** during functional activities.. Baseline: *** Goal status: {GOALSTATUS:25110}    PLAN:  PT FREQUENCY: {rehab frequency:25116}  PT DURATION: {rehab duration:25117}  PLANNED INTERVENTIONS: {rehab planned interventions:25118::"97110-Therapeutic exercises","97530- Therapeutic [phone]- Neuromuscular re-education","97535- Self ZDGU","44034- Manual therapy"}  PLAN FOR NEXT SESSION: ***   Armond Bertin, PT, DPT [facility] Office: [phone] 7:48 AM, 02/24/24

## 2024-02-24 NOTE — Therapy (Signed)
 [facility] Outpatient Rehabilitation at [facility] [address] [place], [place], [address] Phone: [phone]   Fax:  [phone]  Patient Details  Name: Tara [name] MRN: [id number] Date of Birth: 10-20-1945 Referring Provider:  No ref. provider found  Encounter Date: 02/24/2024   Pt was called concerning her missed appointment. Pts daughter picked up and was given the front desks number to call and reschedule tomorrow.  Armond Bertin, PT, DPT [facility] Office: [phone] 5:14 PM, 02/24/24   [facility] Out[facility] Rehabilitation at [facility] [address] [place], [place], [address] Phone: [phone]   Fax:  [phone]

## 2024-02-25 ENCOUNTER — Other Ambulatory Visit: Payer: Self-pay | Admitting: Family Medicine

## 2024-02-26 ENCOUNTER — Telehealth: Payer: Self-pay

## 2024-02-26 MED ORDER — CLONAZEPAM 0.5 MG PO TABS: 0.5000 mg | ORAL_TABLET | Freq: Every day | ORAL | 5 refills | Status: DC

## 2024-02-26 NOTE — Telephone Encounter (Signed)
 Medication is refilled.Marland KitchenMarland Kitchen

## 2024-02-26 NOTE — Telephone Encounter (Signed)
 Copied from CRM [id number]. Topic: Clinical - Medication Question >> Feb 26, 2024  9:33 AM Allyne Areola wrote: Reason for CRM: [facility] - [place], [place] - [address] [place]  Phone: [phone] Fax: [phone] is calling to follow up on multiple refill request they have submitted to the office without a response. I advised of the turnaround time but they would like for it to be expedited.  clonazePAM  (KLONOPIN ) 0.5 MG tablet [[id number]]

## 2024-02-29 DIAGNOSIS — R7989 Other specified abnormal findings of blood chemistry: Secondary | ICD-10-CM | POA: Diagnosis not present

## 2024-03-01 ENCOUNTER — Telehealth (HOSPITAL_COMMUNITY): Payer: Self-pay

## 2024-03-01 DIAGNOSIS — Z8701 Personal history of pneumonia (recurrent): Secondary | ICD-10-CM | POA: Diagnosis not present

## 2024-03-01 DIAGNOSIS — Z860100 Personal history of colon polyps, unspecified: Secondary | ICD-10-CM | POA: Diagnosis not present

## 2024-03-01 DIAGNOSIS — Z923 Personal history of irradiation: Secondary | ICD-10-CM | POA: Diagnosis not present

## 2024-03-01 DIAGNOSIS — Z853 Personal history of malignant neoplasm of breast: Secondary | ICD-10-CM | POA: Diagnosis not present

## 2024-03-01 DIAGNOSIS — K449 Diaphragmatic hernia without obstruction or gangrene: Secondary | ICD-10-CM | POA: Diagnosis not present

## 2024-03-01 DIAGNOSIS — I471 Supraventricular tachycardia, unspecified: Secondary | ICD-10-CM | POA: Diagnosis not present

## 2024-03-01 DIAGNOSIS — K219 Gastro-esophageal reflux disease without esophagitis: Secondary | ICD-10-CM | POA: Diagnosis not present

## 2024-03-01 DIAGNOSIS — R001 Bradycardia, unspecified: Secondary | ICD-10-CM | POA: Diagnosis not present

## 2024-03-01 DIAGNOSIS — E785 Hyperlipidemia, unspecified: Secondary | ICD-10-CM | POA: Diagnosis not present

## 2024-03-01 DIAGNOSIS — I480 Paroxysmal atrial fibrillation: Secondary | ICD-10-CM | POA: Diagnosis not present

## 2024-03-01 DIAGNOSIS — I428 Other cardiomyopathies: Secondary | ICD-10-CM | POA: Diagnosis not present

## 2024-03-01 DIAGNOSIS — S02672D Fracture of alveolus of left mandible, subsequent encounter for fracture with routine healing: Secondary | ICD-10-CM | POA: Diagnosis not present

## 2024-03-01 DIAGNOSIS — Z86718 Personal history of other venous thrombosis and embolism: Secondary | ICD-10-CM | POA: Diagnosis not present

## 2024-03-01 DIAGNOSIS — F32A Depression, unspecified: Secondary | ICD-10-CM | POA: Diagnosis not present

## 2024-03-01 DIAGNOSIS — Z7901 Long term (current) use of anticoagulants: Secondary | ICD-10-CM | POA: Diagnosis not present

## 2024-03-01 DIAGNOSIS — R569 Unspecified convulsions: Secondary | ICD-10-CM | POA: Diagnosis not present

## 2024-03-01 DIAGNOSIS — S065X0D Traumatic subdural hemorrhage without loss of consciousness, subsequent encounter: Secondary | ICD-10-CM | POA: Diagnosis not present

## 2024-03-01 DIAGNOSIS — I959 Hypotension, unspecified: Secondary | ICD-10-CM | POA: Diagnosis not present

## 2024-03-01 DIAGNOSIS — Z9011 Acquired absence of right breast and nipple: Secondary | ICD-10-CM | POA: Diagnosis not present

## 2024-03-01 DIAGNOSIS — Z8611 Personal history of tuberculosis: Secondary | ICD-10-CM | POA: Diagnosis not present

## 2024-03-01 DIAGNOSIS — F419 Anxiety disorder, unspecified: Secondary | ICD-10-CM | POA: Diagnosis not present

## 2024-03-01 DIAGNOSIS — Z9221 Personal history of antineoplastic chemotherapy: Secondary | ICD-10-CM | POA: Diagnosis not present

## 2024-03-01 DIAGNOSIS — N183 Chronic kidney disease, stage 3 unspecified: Secondary | ICD-10-CM | POA: Diagnosis not present

## 2024-03-01 DIAGNOSIS — I5022 Chronic systolic (congestive) heart failure: Secondary | ICD-10-CM | POA: Diagnosis not present

## 2024-03-01 DIAGNOSIS — Z86011 Personal history of benign neoplasm of the brain: Secondary | ICD-10-CM | POA: Diagnosis not present

## 2024-03-01 NOTE — Telephone Encounter (Signed)
 Called to confirm/remind patient of their appointment at [facility] on 03/02/24.   Appointment:   [] Confirmed  [x] Left mess   [] No answer/No voice mail  [] VM Full/unable to leave message  [] Phone not in service And to bring in all medications and/or complete list.

## 2024-03-02 ENCOUNTER — Encounter (HOSPITAL_COMMUNITY): Payer: Self-pay

## 2024-03-02 ENCOUNTER — Ambulatory Visit (HOSPITAL_COMMUNITY)
Admission: RE | Admit: 2024-03-02 | Discharge: 2024-03-02 | Disposition: A | Discharge status: Home / Self Care | Source: Ambulatory Visit | Attending: Family Medicine | Admitting: Family Medicine

## 2024-03-02 ENCOUNTER — Ambulatory Visit (HOSPITAL_COMMUNITY): Payer: Self-pay | Admitting: Family Medicine

## 2024-03-02 VITALS — BP 142/78 | HR 62 | Wt 139.4 lb

## 2024-03-02 DIAGNOSIS — R7989 Other specified abnormal findings of blood chemistry: Secondary | ICD-10-CM | POA: Diagnosis not present

## 2024-03-02 DIAGNOSIS — Z9011 Acquired absence of right breast and nipple: Secondary | ICD-10-CM | POA: Insufficient documentation

## 2024-03-02 DIAGNOSIS — Z853 Personal history of malignant neoplasm of breast: Secondary | ICD-10-CM | POA: Diagnosis not present

## 2024-03-02 DIAGNOSIS — Z9221 Personal history of antineoplastic chemotherapy: Secondary | ICD-10-CM | POA: Diagnosis not present

## 2024-03-02 DIAGNOSIS — Z86718 Personal history of other venous thrombosis and embolism: Secondary | ICD-10-CM | POA: Insufficient documentation

## 2024-03-02 DIAGNOSIS — I480 Paroxysmal atrial fibrillation: Secondary | ICD-10-CM | POA: Diagnosis not present

## 2024-03-02 DIAGNOSIS — N1832 Chronic kidney disease, stage 3b: Secondary | ICD-10-CM

## 2024-03-02 DIAGNOSIS — I428 Other cardiomyopathies: Secondary | ICD-10-CM | POA: Diagnosis not present

## 2024-03-02 DIAGNOSIS — Z79899 Other long term (current) drug therapy: Secondary | ICD-10-CM | POA: Diagnosis not present

## 2024-03-02 DIAGNOSIS — I5022 Chronic systolic (congestive) heart failure: Secondary | ICD-10-CM | POA: Insufficient documentation

## 2024-03-02 DIAGNOSIS — I959 Hypotension, unspecified: Secondary | ICD-10-CM | POA: Insufficient documentation

## 2024-03-02 DIAGNOSIS — Z7901 Long term (current) use of anticoagulants: Secondary | ICD-10-CM | POA: Insufficient documentation

## 2024-03-02 DIAGNOSIS — I4819 Other persistent atrial fibrillation: Secondary | ICD-10-CM | POA: Insufficient documentation

## 2024-03-02 DIAGNOSIS — I471 Supraventricular tachycardia, unspecified: Secondary | ICD-10-CM | POA: Insufficient documentation

## 2024-03-02 DIAGNOSIS — E785 Hyperlipidemia, unspecified: Secondary | ICD-10-CM | POA: Diagnosis not present

## 2024-03-02 DIAGNOSIS — Z9181 History of falling: Secondary | ICD-10-CM | POA: Diagnosis not present

## 2024-03-02 DIAGNOSIS — N183 Chronic kidney disease, stage 3 unspecified: Secondary | ICD-10-CM | POA: Insufficient documentation

## 2024-03-02 DIAGNOSIS — Z923 Personal history of irradiation: Secondary | ICD-10-CM | POA: Diagnosis not present

## 2024-03-02 LAB — BRAIN NATRIURETIC PEPTIDE: B Natriuretic Peptide: 152.6 pg/mL — ABNORMAL HIGH (ref 0.0–100.0)

## 2024-03-02 LAB — LIPID PANEL
Cholesterol: 184 mg/dL (ref 0–200)
HDL: 67 mg/dL
LDL Cholesterol: 105 mg/dL — ABNORMAL HIGH (ref 0–99)
Total CHOL/HDL Ratio: 2.7 ratio
Triglycerides: 60 mg/dL
VLDL: 12 mg/dL (ref 0–40)

## 2024-03-02 LAB — COMPREHENSIVE METABOLIC PANEL WITH GFR
ALT: 37 U/L (ref 0–44)
AST: 44 U/L — ABNORMAL HIGH (ref 15–41)
Albumin: 3.4 g/dL — ABNORMAL LOW (ref 3.5–5.0)
Alkaline Phosphatase: 94 U/L (ref 38–126)
Anion gap: 9 (ref 5–15)
BUN: 23 mg/dL (ref 8–23)
CO2: 21 mmol/L — ABNORMAL LOW (ref 22–32)
Calcium: 9 mg/dL (ref 8.9–10.3)
Chloride: 109 mmol/L (ref 98–111)
Creatinine, Ser: 1.32 mg/dL — ABNORMAL HIGH (ref 0.44–1.00)
GFR, Estimated: 41 mL/min — ABNORMAL LOW (ref >60)
Glucose, Bld: 72 mg/dL (ref 70–99)
Potassium: 4.1 mmol/L (ref 3.5–5.1)
Sodium: 139 mmol/L (ref 135–145)
Total Bilirubin: 0.5 mg/dL (ref 0.0–1.2)
Total Protein: 7.3 g/dL (ref 6.5–8.1)

## 2024-03-02 MED ORDER — MIDODRINE HCL 2.5 MG PO TABS: 2.5000 mg | ORAL_TABLET | Freq: Three times a day (TID) | ORAL | 5 refills | Status: DC

## 2024-03-02 NOTE — Progress Notes (Signed)
 ADVANCED HF CLINIC NOTE  PCP: Dr. Rodolph Clap Cardiology: Dr. Nunzio Belch HF Cardiology: Dr. Mitzie Anda  78 y.o. AAF with chronic systolic HF, hx of SVT, CKD stage 3 and persistent a fib.   Dr. Nunzio Belch has followed her for a short RP SVT.    Patient was initially found to have low EF in 2014. Echo 6/17: EF 35-40%. Cardiolite in 8/1: fixed apical defect but no evidence for ischemia. She was admitted in 1/18 with PNA, DVT, and CHF exacerbation.  Echo at that time showed EF down to 20-25%.  She was diuresed and discharged.   Patient was diagnosed with breast cancer in 2008 and had lumpectomy, radiation, and chemotherapy which included epirubicin, and anthracycline.  Recurrent cancer in 2019 with right mastectomy and chemo with cyclophosphamide , MTX, and fluorouracil .   RHC/LHC 10/18: no significant CAD and mildly elevate PCWP.  Cardiac MRI 11/18: EF 49%, no LGE.   Echo 5/21: EF 45%, GHK, mildly decreased RV systolic function.  Echo 7/22: EF 55% with normal RV and IVC.    Patient was in atrial fibrillation with RVR in 6/23, converted back to NSR with amiodarone .   Echo 7/23: EF 40-45%, D-shaped septum, moderate RV dysfunction with mild RV enlargement, IVC normal.   9/24 hospitalized for aspiration PNA  Follow up 10/24, in atrial fibrillation and mildly volume overloaded. Lasix  increased to 40 daily, Farxiga  started and arranged for repeat echo and DCCV.   Echo 11/24 EF 30-35%, RV mildly reduced. DCCV 08/06/23 to NSR  Echo (3/25): EF 40%, normal RV, IVC normal Echo 4/25 EF 50-55%, normal RV   Admitted 4/25 post fall on eliquis . Found to be hypotensive and bradycardic on arrival 2/2 polypharmacy. Fall resultant with SDH and L mandible fx. Briefly required dopamine . Echo EF 55%. Neurosurgery ok with Eliquis  at discharge. Discharged with Zio. Amio/Farxiga  and spiro held at discharge. Started on midodrine .   Zio 5/25 showed mostly NSR, no worrisome arrhythmias, mild bradycardia with average HR 57  bpm.  Today she returns for HF follow up with her husband. Overall feeling fine.She is working with PT and doing well. Using walker, no SOB with this. Denies palpitations, abnormal bleeding, CP, dizziness, edema, or PND/Orthopnea. She sleeps in hospital bed. Appetite ok. Weight at home 138-139 pounds. Taking all medications, uses Lasix  1-2x/week. Including Crestor . No regular Tylenol  use and she does not drink ETOH.   ECG (personally reviewed): none ordered today.   Labs (10/24): K 4.3, creatinine 1.95, AST 54, ALT 46, hgb 9.8, BNP 392 Labs (1/25): K 4.3, creatinine 1.81 Labs (3/25): LDL 169 Labs (5/25): K 4.6, creatinine 1.51, AST 56, ALT 56   PMH: 1. SVT: Short R-P SVT, followed by Dr. Nunzio Belch.  2. DVT in 1/18 in setting of PNA.  She was on Xarelto  for about 6 months.  3. Breast cancer: Diagnosed on right in 2008.  She had lumpectomy, radiation, and chemotherapy.  She had fluorouracil , epirubicin, Cytoxan .  - Recent mammogram with right breast calcifications => biopsy with complex sclerosing lesion.  - Recurrent cancer 2019, she had right mastectomy and chemotherapy with cyclophosphamide , MTX, fluorouracil .  4. GERD 5. Hyperlipidemia 6. Chronic systolic CHF: Uncertain cause of cardiomyopathy.  Echo in 2014 showed EF 40%.  It is possible that it is related to epirubicin use (anthracycline).  - Echo (6/17): EF 35-40%, mild LV dilation.  - Cardiolite (8/17): no ischemia, apical lateral/apical fixed perfusion defect.  - Echo (1/18): EF 20-25%, moderate MR, severe TR, D-shaped IV septum, PASP 35 mmHg.  -  RHC/LHC (10/18): No angiographic CAD.  Mean RA 5, PA 44/11 mean 26, mean PCWP 18, CI 3.49.  - Cardiac MRI (11/18): EF 49%, diffuse mild hypokinesis, mild RV dilation with normal function, no LGE.  - Echo (5/21): EF 45%, global hypokinesis, mildly decreased RV systolic function.  - Echo (7/22): EF 55% with normal RV and IVC. - Echo (7/23): EF 40-45%, D-shaped septum, moderate RV dysfunction  with mild RV enlargement, IVC normal.  - Echo (11/24): EF 30-35%, RV systolic function reduced, moderate MR and moderate to severe TR - Echo (3/25): EF 40%, normal RV, IVC normal - Echo (4/25): EF 50-55%, normal RV  7. Atrial fibrillation: First noted in 6/23. S/p DCCV 11/24. - Zio (5/25): mostly NSR, no worrisome arrhythmias, mild bradycardia with average HR 57 bpm. 8. Meningioma: Resection in 6/23.  9. Aspiration PNA x 3 episodes. 10. CKD stage 3  FH: Colon cancer.  No known heart disease.   SH: Married, never smoked.  Lives in [place].  Retired Engineer, civil (consulting).    ROS: All systems reviewed and negative except as per HPI.   Current Outpatient Medications  Medication Sig Dispense Refill   acetaminophen  (TYLENOL ) 500 MG tablet Take 1,000 mg by mouth every 6 (six) hours as needed for moderate pain (pain score 4-6) or mild pain (pain score 1-3).     apixaban  (ELIQUIS ) 5 MG TABS tablet Take 1 tablet (5 mg total) by mouth 2 (two) times daily. 60 tablet 6   bisacodyl  5 MG EC tablet One tablet every 3 days, if needed, for constipation (Patient taking differently: Take 5 mg by mouth daily as needed for moderate constipation or mild constipation.) 30 tablet 1   clonazePAM  (KLONOPIN ) 0.5 MG tablet Take 1 tablet (0.5 mg total) by mouth at bedtime. 30 tablet 5   docusate sodium  (COLACE) 100 MG capsule Take 100 mg by mouth 2 (two) times daily. As needed     fluticasone  (FLONASE ) 50 MCG/ACT nasal spray Place 2 sprays into both nostrils daily. (Patient taking differently: Place 2 sprays into both nostrils daily as needed for rhinitis or allergies.) 48 g 3   furosemide  (LASIX ) 20 MG tablet Take 1 tablet (20 mg total) by mouth daily as needed (for wt gain > 3 lb in 24h or > 5 lb in 1wk). 30 tablet 11   hydrocortisone  (ANUSOL -HC) 2.5 % rectal cream APPLY RECTALLY TWICE DAILY. (Patient taking differently: Place 1 Application rectally 2 (two) times daily as needed for hemorrhoids or anal itching.) 30 g 3    levETIRAcetam  (KEPPRA ) 500 MG tablet Take 1 tablet (500 mg total) by mouth 2 (two) times daily. 180 tablet 3   midodrine  (PROAMATINE ) 5 MG tablet Take 1 tablet (5 mg total) by mouth 3 (three) times daily with meals. 90 tablet 6   montelukast  (SINGULAIR ) 10 MG tablet Take 1 tablet (10 mg total) by mouth at bedtime. 90 tablet 3   pantoprazole  (PROTONIX ) 40 MG tablet TAKE ONE TABLET BY MOUTH TWICE DAILY 60 tablet 0   polyethylene glycol (MIRALAX ) 17 g packet Take one packet mira lax once daily in 8 ounces water  (Patient taking differently: Take 17 g by mouth daily as needed for moderate constipation, mild constipation or severe constipation.) 28 each 5   potassium chloride  SA (KLOR-CON  M) 20 MEQ tablet Take 1 tablet (20 mEq total) by mouth daily as needed (when take furosemide ). 30 tablet 11   rosuvastatin  (CRESTOR ) 10 MG tablet Take 1 tablet (10 mg total) by mouth daily. 90  tablet 3   sertraline  (ZOLOFT ) 25 MG tablet TAKE 1 TABLET BY MOUTH ONCE A DAY. 30 tablet 0   [Paused] amiodarone  (PACERONE ) 200 MG tablet Take 1 tablet (200 mg total) by mouth daily. (Patient not taking: Reported on 02/23/2024) 30 tablet 6   [Paused] spironolactone  (ALDACTONE ) 25 MG tablet Take 0.5 tablets (12.5 mg total) by mouth daily. (Patient not taking: Reported on 02/23/2024) 45 tablet 3   No current facility-administered medications for this encounter.   Wt Readings from Last 3 Encounters:  03/02/24 63.2 kg (139 lb 6.4 oz)  02/23/24 63 kg (139 lb)  02/03/24 63.5 kg (140 lb)   BP (!) 142/78   Pulse 62   Wt 63.2 kg (139 lb 6.4 oz)   SpO2 95%   BMI 30.17 kg/m  Physical Exam General:  NAD. No resp difficulty, walked into clinic with walker, elderly HEENT: Normal Neck: Supple. No JVD. Cor: Regular rate & rhythm. No rubs, gallops or murmurs. Lungs: Clear Abdomen: Soft, nontender, nondistended.  Extremities: No cyanosis, clubbing, rash, edema Neuro: Alert & oriented x 3, moves all 4 extremities w/o difficulty. Affect  pleasant.  Assessment/Plan: 1. Chronic systolic CHF: Long-standing cardiomyopathy, since at least 2014.  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  Could be due to epirubicin exposure with 2008 breast cancer treatment.  Cardiac MRI 11/18: EF up to 49% with no LGE.  Echo 5/21: EF 45% range.  Echo 7/22: EF 55%.  Echo 7/23: EF 40-45%, D-shaped septum, mod RV dysfunction with mild RV enlargement, IVC normal.  Back in atrial fibrillation 10/24, echo 11/24: EF 30-35%. ? If reduction in EF due to atrial fibrillation. Once back in NSR, echo 3/25 with EF about 40%.  NYHA class II-IIIa symptoms, she is not volume overloaded on exam.  GDMT limited by low BP and CKD.  - Decrease midodrine  to 2.5 mg tid. Plan to stop next visit and add back GDMT. - Continue Lasix  20 mg daily PRN.  - Off spiro and Farxiga  d/t recent hypotension and fall. Can consider restarting Farxiga  at follow up. Had the most BP effect from spiro.  - Consider Cardiac Rehab once graduated from [facility] PT. 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   - Off amiodarone  3. CKD stage 3: Baseline SCr 1.8.  - BMET today. 4. Atrial fibrillation: Persistent.  S/p DC-CV 11/24. Regular on exam today. - Now off amiodarone  with persistent bradycardia requiring dopamine  during recent admit.  - Continue Eliquis  5 mg bid. No bleeding issues. Recent CBC stable - She completed her Zio patch and mailed back, I do not have results in Epic. Will look into this. 5. Elevated LFTs: Mild, stable. ? Due to CHF, ? due to amiodarone .  - Now off amiodarone . Check LFTs today. - She has been taking her rosuvastatin  10 mg daily. As above, check LFTs & lipids today.  Follow up in 3 weeks with PharmD (stop midodrine  and add back spiro or Farxiga ) and 6-8 weeks with APP.  Arlice Bene [facility] FNP-BC 03/02/2024

## 2024-03-02 NOTE — Patient Instructions (Addendum)
 Good to see you today!  DECREASE Midodrine  to 2.5 mg TID  Please follow up with our heart failure pharmacist  as scheduled  Labs done today, your results will be available in MyChart, we will contact you for abnormal readings.  Your physician recommends that you schedule a follow-up appointment as scheduled  If you have any questions or concerns before your next appointment please send us  a message through [place] or call our office at [phone].    TO LEAVE A MESSAGE FOR THE NURSE SELECT OPTION 2, PLEASE LEAVE A MESSAGE INCLUDING: YOUR NAME DATE OF BIRTH CALL BACK NUMBER REASON FOR CALL**this is important as we prioritize the call backs  YOU WILL RECEIVE A CALL BACK THE SAME DAY AS LONG AS YOU CALL BEFORE 4:00 PM At the [facility], you and your health needs are our priority. As part of our continuing mission to provide you with exceptional heart care, we have created designated Provider Care Teams. These Care Teams include your primary Cardiologist (physician) and Advanced Practice Providers (APPs- Physician Assistants and Nurse Practitioners) who all work together to provide you with the care you need, when you need it.   You may see any of the following providers on your designated Care Team at your next follow up: Dr Jules Oar Dr Peder Bourdon Dr. Alwin Baars Dr. Arta Lark Amy Marijane Shoulders, NP Ruddy Corral, [place] [facility] Morton, [place] Dennise Fitz, NP [place] Lee, NP Shawnee Dellen, NP Luster Salters, PharmD Bevely Brush, PharmD   Please be sure to bring in all your medications bottles to every appointment.    Thank you for choosing  [facility]

## 2024-03-04 ENCOUNTER — Ambulatory Visit
Admission: RE | Admit: 2024-03-04 | Discharge: 2024-03-04 | Disposition: A | Discharge status: Home / Self Care | Source: Ambulatory Visit | Attending: Family Medicine | Admitting: Family Medicine

## 2024-03-04 ENCOUNTER — Ambulatory Visit

## 2024-03-04 DIAGNOSIS — Z1231 Encounter for screening mammogram for malignant neoplasm of breast: Secondary | ICD-10-CM | POA: Diagnosis not present

## 2024-03-04 DIAGNOSIS — D631 Anemia in chronic kidney disease: Secondary | ICD-10-CM | POA: Diagnosis not present

## 2024-03-04 DIAGNOSIS — I10 Essential (primary) hypertension: Secondary | ICD-10-CM | POA: Diagnosis not present

## 2024-03-04 DIAGNOSIS — N184 Chronic kidney disease, stage 4 (severe): Secondary | ICD-10-CM | POA: Diagnosis not present

## 2024-03-04 HISTORY — DX: Personal history of antineoplastic chemotherapy: Z92.21

## 2024-03-08 DIAGNOSIS — N1832 Chronic kidney disease, stage 3b: Secondary | ICD-10-CM | POA: Diagnosis not present

## 2024-03-08 DIAGNOSIS — D631 Anemia in chronic kidney disease: Secondary | ICD-10-CM | POA: Diagnosis not present

## 2024-03-08 DIAGNOSIS — I959 Hypotension, unspecified: Secondary | ICD-10-CM | POA: Diagnosis not present

## 2024-03-09 DIAGNOSIS — E785 Hyperlipidemia, unspecified: Secondary | ICD-10-CM | POA: Diagnosis not present

## 2024-03-09 DIAGNOSIS — N183 Chronic kidney disease, stage 3 unspecified: Secondary | ICD-10-CM | POA: Diagnosis not present

## 2024-03-09 DIAGNOSIS — K449 Diaphragmatic hernia without obstruction or gangrene: Secondary | ICD-10-CM | POA: Diagnosis not present

## 2024-03-09 DIAGNOSIS — Z7901 Long term (current) use of anticoagulants: Secondary | ICD-10-CM | POA: Diagnosis not present

## 2024-03-09 DIAGNOSIS — I471 Supraventricular tachycardia, unspecified: Secondary | ICD-10-CM | POA: Diagnosis not present

## 2024-03-09 DIAGNOSIS — R569 Unspecified convulsions: Secondary | ICD-10-CM | POA: Diagnosis not present

## 2024-03-09 DIAGNOSIS — I480 Paroxysmal atrial fibrillation: Secondary | ICD-10-CM | POA: Diagnosis not present

## 2024-03-09 DIAGNOSIS — R001 Bradycardia, unspecified: Secondary | ICD-10-CM | POA: Diagnosis not present

## 2024-03-09 DIAGNOSIS — Z9011 Acquired absence of right breast and nipple: Secondary | ICD-10-CM | POA: Diagnosis not present

## 2024-03-09 DIAGNOSIS — Z9221 Personal history of antineoplastic chemotherapy: Secondary | ICD-10-CM | POA: Diagnosis not present

## 2024-03-09 DIAGNOSIS — F419 Anxiety disorder, unspecified: Secondary | ICD-10-CM | POA: Diagnosis not present

## 2024-03-09 DIAGNOSIS — Z86011 Personal history of benign neoplasm of the brain: Secondary | ICD-10-CM | POA: Diagnosis not present

## 2024-03-09 DIAGNOSIS — Z853 Personal history of malignant neoplasm of breast: Secondary | ICD-10-CM | POA: Diagnosis not present

## 2024-03-09 DIAGNOSIS — Z860100 Personal history of colon polyps, unspecified: Secondary | ICD-10-CM | POA: Diagnosis not present

## 2024-03-09 DIAGNOSIS — I428 Other cardiomyopathies: Secondary | ICD-10-CM | POA: Diagnosis not present

## 2024-03-09 DIAGNOSIS — I959 Hypotension, unspecified: Secondary | ICD-10-CM | POA: Diagnosis not present

## 2024-03-09 DIAGNOSIS — F32A Depression, unspecified: Secondary | ICD-10-CM | POA: Diagnosis not present

## 2024-03-09 DIAGNOSIS — Z923 Personal history of irradiation: Secondary | ICD-10-CM | POA: Diagnosis not present

## 2024-03-09 DIAGNOSIS — S065X0D Traumatic subdural hemorrhage without loss of consciousness, subsequent encounter: Secondary | ICD-10-CM | POA: Diagnosis not present

## 2024-03-09 DIAGNOSIS — Z86718 Personal history of other venous thrombosis and embolism: Secondary | ICD-10-CM | POA: Diagnosis not present

## 2024-03-09 DIAGNOSIS — Z8611 Personal history of tuberculosis: Secondary | ICD-10-CM | POA: Diagnosis not present

## 2024-03-09 DIAGNOSIS — S02672D Fracture of alveolus of left mandible, subsequent encounter for fracture with routine healing: Secondary | ICD-10-CM | POA: Diagnosis not present

## 2024-03-09 DIAGNOSIS — I5022 Chronic systolic (congestive) heart failure: Secondary | ICD-10-CM | POA: Diagnosis not present

## 2024-03-09 DIAGNOSIS — K219 Gastro-esophageal reflux disease without esophagitis: Secondary | ICD-10-CM | POA: Diagnosis not present

## 2024-03-09 DIAGNOSIS — Z8701 Personal history of pneumonia (recurrent): Secondary | ICD-10-CM | POA: Diagnosis not present

## 2024-03-22 ENCOUNTER — Other Ambulatory Visit: Payer: Self-pay | Admitting: Family Medicine

## 2024-03-23 NOTE — Progress Notes (Incomplete)
 ***In Progress***    Advanced Heart Failure Clinic Note   HPI:     Today he returns to HF clinic for pharmacist medication titration. At last visit with MD ***.   Overall feeling ***. Dizziness, lightheadedness, fatigue:  Chest pain or palpitations:  How is your breathing?: *** SOB: Able to complete all ADLs. Activity level ***  Weight at home pounds. Takes furosemide /torsemide/bumex *** mg *** daily.  LEE PND/Orthopnea  Appetite *** Low-salt diet:   Physical Exam Cost/affordability of meds   HF Medications:   Has the patient been experiencing any side effects to the medications prescribed?  {YES NO:22349}  Does the patient have any problems obtaining medications due to transportation or finances?   {YES NO:22349}  Understanding of regimen: {excellent/good/fair/poor:19665} Understanding of indications: {excellent/good/fair/poor:19665} Potential of compliance: {excellent/good/fair/poor:19665} Patient understands to avoid NSAIDs. Patient understands to avoid decongestants.    Pertinent Lab Values: Serum creatinine ***, BUN ***, Potassium ***, Sodium ***, BNP ***, Magnesium  ***, Digoxin ***   Vital Signs: Weight: *** (last clinic weight: ***) Blood pressure: ***  Heart rate: ***   Assessment/Plan: 1. Chronic systolic CHF (EF ***), due to ***. NYHA class *** symptoms.   - Basic disease state pathophysiology, medication indication, mechanism and side effects reviewed at length with patient and he verbalized understanding  Follow up ***   [name], PharmD, BCPS, BCCP, CPP Heart Failure Clinic Pharmacist [phone]       ADVANCED HF CLINIC NOTE  PCP: Dr. Antonetta Cardiology: Dr. Kelsie HF Cardiology: Dr. Rolan  78 y.o. AAF with chronic systolic HF, hx of SVT, CKD stage 3 and persistent a fib.   Dr. Kelsie has followed her for a short RP SVT.    Patient was initially found to have low EF in 2014. Echo 6/17: EF 35-40%. Cardiolite in 8/1:  fixed apical defect but no evidence for ischemia. She was admitted in 1/18 with PNA, DVT, and CHF exacerbation.  Echo at that time showed EF down to 20-25%.  She was diuresed and discharged.   Patient was diagnosed with breast cancer in 2008 and had lumpectomy, radiation, and chemotherapy which included epirubicin, and anthracycline.  Recurrent cancer in 2019 with right mastectomy and chemo with cyclophosphamide , MTX, and fluorouracil .   RHC/LHC 10/18: no significant CAD and mildly elevate PCWP.  Cardiac MRI 11/18: EF 49%, no LGE.   Echo 5/21: EF 45%, GHK, mildly decreased RV systolic function.  Echo 7/22: EF 55% with normal RV and IVC.    Patient was in atrial fibrillation with RVR in 6/23, converted back to NSR with amiodarone .   Echo 7/23: EF 40-45%, D-shaped septum, moderate RV dysfunction with mild RV enlargement, IVC normal.   9/24 hospitalized for aspiration PNA  Follow up 10/24, in atrial fibrillation and mildly volume overloaded. Lasix  increased to 40 daily, Farxiga  started and arranged for repeat echo and DCCV.   Echo 11/24 EF 30-35%, RV mildly reduced. DCCV 08/06/23 to NSR  Echo (3/25): EF 40%, normal RV, IVC normal Echo 4/25 EF 50-55%, normal RV   Admitted 4/25 post fall on eliquis . Found to be hypotensive and bradycardic on arrival 2/2 polypharmacy. Fall resultant with SDH and L mandible fx. Briefly required dopamine . Echo EF 55%. Neurosurgery ok with Eliquis  at discharge. Discharged with Zio. Amio/Farxiga  and spiro held at discharge. Started on midodrine .   Zio 5/25 showed mostly NSR, no worrisome arrhythmias, mild bradycardia with average HR 57 bpm.  Today she returns for HF follow up with her husband. Overall  feeling fine.She is working with PT and doing well. Using walker, no SOB with this. Denies palpitations, abnormal bleeding, CP, dizziness, edema, or PND/Orthopnea. She sleeps in hospital bed. Appetite ok. Weight at home 138-139 pounds. Taking all medications, uses Lasix   1-2x/week. Including Crestor . No regular Tylenol  use and she does not drink ETOH.   ECG (personally reviewed): none ordered today.   Labs (10/24): K 4.3, creatinine 1.95, AST 54, ALT 46, hgb 9.8, BNP 392 Labs (1/25): K 4.3, creatinine 1.81 Labs (3/25): LDL 169 Labs (5/25): K 4.6, creatinine 1.51, AST 56, ALT 56   PMH: 1. SVT: Short R-P SVT, followed by Dr. Kelsie.  2. DVT in 1/18 in setting of PNA.  She was on Xarelto  for about 6 months.  3. Breast cancer: Diagnosed on right in 2008.  She had lumpectomy, radiation, and chemotherapy.  She had fluorouracil , epirubicin, Cytoxan .  - Recent mammogram with right breast calcifications => biopsy with complex sclerosing lesion.  - Recurrent cancer 2019, she had right mastectomy and chemotherapy with cyclophosphamide , MTX, fluorouracil .  4. GERD 5. Hyperlipidemia 6. Chronic systolic CHF: Uncertain cause of cardiomyopathy.  Echo in 2014 showed EF 40%.  It is possible that it is related to epirubicin use (anthracycline).  - Echo (6/17): EF 35-40%, mild LV dilation.  - Cardiolite (8/17): no ischemia, apical lateral/apical fixed perfusion defect.  - Echo (1/18): EF 20-25%, moderate MR, severe TR, D-shaped IV septum, PASP 35 mmHg.  - RHC/LHC (10/18): No angiographic CAD.  Mean RA 5, PA 44/11 mean 26, mean PCWP 18, CI 3.49.  - Cardiac MRI (11/18): EF 49%, diffuse mild hypokinesis, mild RV dilation with normal function, no LGE.  - Echo (5/21): EF 45%, global hypokinesis, mildly decreased RV systolic function.  - Echo (7/22): EF 55% with normal RV and IVC. - Echo (7/23): EF 40-45%, D-shaped septum, moderate RV dysfunction with mild RV enlargement, IVC normal.  - Echo (11/24): EF 30-35%, RV systolic function reduced, moderate MR and moderate to severe TR - Echo (3/25): EF 40%, normal RV, IVC normal - Echo (4/25): EF 50-55%, normal RV  7. Atrial fibrillation: First noted in 6/23. S/p DCCV 11/24. - Zio (5/25): mostly NSR, no worrisome arrhythmias, mild  bradycardia with average HR 57 bpm. 8. Meningioma: Resection in 6/23.  9. Aspiration PNA x 3 episodes. 10. CKD stage 3  FH: Colon cancer.  No known heart disease.   SH: Married, never smoked.  Lives in [place].  Retired Engineer, civil (consulting).    ROS: All systems reviewed and negative except as per HPI.   Current Outpatient Medications  Medication Sig Dispense Refill   acetaminophen  (TYLENOL ) 500 MG tablet Take 1,000 mg by mouth every 6 (six) hours as needed for moderate pain (pain score 4-6) or mild pain (pain score 1-3).     [Paused] amiodarone  (PACERONE ) 200 MG tablet Take 1 tablet (200 mg total) by mouth daily. (Patient not taking: Reported on 02/23/2024) 30 tablet 6   apixaban  (ELIQUIS ) 5 MG TABS tablet Take 1 tablet (5 mg total) by mouth 2 (two) times daily. 60 tablet 6   bisacodyl  5 MG EC tablet One tablet every 3 days, if needed, for constipation (Patient taking differently: Take 5 mg by mouth daily as needed for moderate constipation or mild constipation.) 30 tablet 1   clonazePAM  (KLONOPIN ) 0.5 MG tablet Take 1 tablet (0.5 mg total) by mouth at bedtime. 30 tablet 5   docusate sodium  (COLACE) 100 MG capsule Take 100 mg by mouth 2 (two) times  daily. As needed     fluticasone  (FLONASE ) 50 MCG/ACT nasal spray Place 2 sprays into both nostrils daily. (Patient taking differently: Place 2 sprays into both nostrils daily as needed for rhinitis or allergies.) 48 g 3   furosemide  (LASIX ) 20 MG tablet Take 1 tablet (20 mg total) by mouth daily as needed (for wt gain > 3 lb in 24h or > 5 lb in 1wk). 30 tablet 11   hydrocortisone  (ANUSOL -HC) 2.5 % rectal cream APPLY RECTALLY TWICE DAILY. (Patient taking differently: Place 1 Application rectally 2 (two) times daily as needed for hemorrhoids or anal itching.) 30 g 3   levETIRAcetam  (KEPPRA ) 500 MG tablet Take 1 tablet (500 mg total) by mouth 2 (two) times daily. 180 tablet 3   midodrine  (PROAMATINE ) 2.5 MG tablet Take 1 tablet (2.5 mg total) by mouth 3 (three)  times daily with meals. 60 tablet 5   montelukast  (SINGULAIR ) 10 MG tablet Take 1 tablet (10 mg total) by mouth at bedtime. 90 tablet 3   pantoprazole  (PROTONIX ) 40 MG tablet TAKE ONE TABLET BY MOUTH TWICE DAILY 60 tablet 0   polyethylene glycol (MIRALAX ) 17 g packet Take one packet mira lax once daily in 8 ounces water  (Patient taking differently: Take 17 g by mouth daily as needed for moderate constipation, mild constipation or severe constipation.) 28 each 5   potassium chloride  SA (KLOR-CON  M) 20 MEQ tablet Take 1 tablet (20 mEq total) by mouth daily as needed (when take furosemide ). 30 tablet 11   rosuvastatin  (CRESTOR ) 10 MG tablet Take 1 tablet (10 mg total) by mouth daily. 90 tablet 3   sertraline  (ZOLOFT ) 25 MG tablet TAKE 1 TABLET BY MOUTH ONCE A DAY. 30 tablet 0   [Paused] spironolactone  (ALDACTONE ) 25 MG tablet Take 0.5 tablets (12.5 mg total) by mouth daily. (Patient not taking: Reported on 02/23/2024) 45 tablet 3   No current facility-administered medications for this encounter.   Wt Readings from Last 3 Encounters:  03/02/24 139 lb 6.4 oz (63.2 kg)  02/23/24 139 lb (63 kg)  02/03/24 140 lb (63.5 kg)   There were no vitals taken for this visit. Physical Exam General:  NAD. No resp difficulty, walked into clinic with walker, elderly HEENT: Normal Neck: Supple. No JVD. Cor: Regular rate & rhythm. No rubs, gallops or murmurs. Lungs: Clear Abdomen: Soft, nontender, nondistended.  Extremities: No cyanosis, clubbing, rash, edema Neuro: Alert & oriented x 3, moves all 4 extremities w/o difficulty. Affect pleasant.  Assessment/Plan: 1. Chronic systolic CHF: Long-standing cardiomyopathy, since at least 2014.  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  Could be due to epirubicin exposure with 2008 breast cancer treatment.  Cardiac MRI 11/18: EF up to 49% with no LGE.  Echo 5/21: EF 45% range.  Echo 7/22: EF 55%.  Echo 7/23: EF 40-45%, D-shaped septum, mod RV dysfunction  with mild RV enlargement, IVC normal.  Back in atrial fibrillation 10/24, echo 11/24: EF 30-35%. ? If reduction in EF due to atrial fibrillation. Once back in NSR, echo 3/25 with EF about 40%.  NYHA class II-IIIa symptoms, she is not volume overloaded on exam.  GDMT limited by low BP and CKD.  - Decrease midodrine  to 2.5 mg tid. Plan to stop next visit and add back GDMT. - Continue Lasix  20 mg daily PRN.  - Off spiro and Farxiga  d/t recent hypotension and fall. Can consider restarting Farxiga  at follow up. Had the most BP effect from spiro.  - Consider Cardiac Rehab  once graduated from [facility] PT. 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   - Off amiodarone  3. CKD stage 3: Baseline SCr 1.8.  - BMET today. 4. Atrial fibrillation: Persistent.  S/p DC-CV 11/24. Regular on exam today. - Now off amiodarone  with persistent bradycardia requiring dopamine  during recent admit.  - Continue Eliquis  5 mg bid. No bleeding issues. Recent CBC stable - She completed her Zio patch and mailed back, I do not have results in Epic. Will look into this. 5. Elevated LFTs: Mild, stable. ? Due to CHF, ? due to amiodarone .  - Now off amiodarone . Check LFTs today. - She has been taking her rosuvastatin  10 mg daily. As above, check LFTs & lipids today.  Follow up in 3 weeks with PharmD (stop midodrine  and add back spiro or Farxiga ) and 6-8 weeks with APP.

## 2024-03-24 ENCOUNTER — Ambulatory Visit (HOSPITAL_COMMUNITY)
Admission: RE | Admit: 2024-03-24 | Discharge: 2024-03-24 | Disposition: A | Discharge status: Home / Self Care | Source: Ambulatory Visit | Attending: Cardiology | Admitting: Cardiology

## 2024-03-24 VITALS — BP 130/76 | HR 57 | Wt 141.4 lb

## 2024-03-24 DIAGNOSIS — Z7984 Long term (current) use of oral hypoglycemic drugs: Secondary | ICD-10-CM | POA: Insufficient documentation

## 2024-03-24 DIAGNOSIS — N183 Chronic kidney disease, stage 3 unspecified: Secondary | ICD-10-CM | POA: Diagnosis not present

## 2024-03-24 DIAGNOSIS — I4819 Other persistent atrial fibrillation: Secondary | ICD-10-CM | POA: Diagnosis not present

## 2024-03-24 DIAGNOSIS — R7989 Other specified abnormal findings of blood chemistry: Secondary | ICD-10-CM | POA: Insufficient documentation

## 2024-03-24 DIAGNOSIS — R001 Bradycardia, unspecified: Secondary | ICD-10-CM | POA: Insufficient documentation

## 2024-03-24 DIAGNOSIS — Z79899 Other long term (current) drug therapy: Secondary | ICD-10-CM | POA: Diagnosis not present

## 2024-03-24 DIAGNOSIS — I5022 Chronic systolic (congestive) heart failure: Secondary | ICD-10-CM | POA: Diagnosis not present

## 2024-03-24 DIAGNOSIS — Z9221 Personal history of antineoplastic chemotherapy: Secondary | ICD-10-CM | POA: Diagnosis not present

## 2024-03-24 DIAGNOSIS — Z923 Personal history of irradiation: Secondary | ICD-10-CM | POA: Insufficient documentation

## 2024-03-24 DIAGNOSIS — Z9011 Acquired absence of right breast and nipple: Secondary | ICD-10-CM | POA: Insufficient documentation

## 2024-03-24 DIAGNOSIS — Z86718 Personal history of other venous thrombosis and embolism: Secondary | ICD-10-CM | POA: Insufficient documentation

## 2024-03-24 DIAGNOSIS — I959 Hypotension, unspecified: Secondary | ICD-10-CM | POA: Insufficient documentation

## 2024-03-24 DIAGNOSIS — I428 Other cardiomyopathies: Secondary | ICD-10-CM | POA: Insufficient documentation

## 2024-03-24 DIAGNOSIS — Z853 Personal history of malignant neoplasm of breast: Secondary | ICD-10-CM | POA: Insufficient documentation

## 2024-03-24 MED ORDER — DAPAGLIFLOZIN PROPANEDIOL 10 MG PO TABS: 10.0000 mg | ORAL_TABLET | Freq: Every day | ORAL | 11 refills | Status: DC

## 2024-03-24 NOTE — Progress Notes (Signed)
 Advanced Heart Failure Clinic Note   PCP: Dr. Antonetta Cardiology: Dr. Kelsie HF Cardiology: Dr. Rolan  HPI:  Tara [name] is a 78 y.o. AAF with chronic systolic HF, hx of SVT, CKD stage 3 and persistent a fib.   Patient was initially found to have low EF in 2014. Echo 02/2016: EF 35-40%. Cardiolite in 04/2016: fixed apical defect but no evidence for ischemia. She was admitted in 09/2016 with PNA, DVT, and CHF exacerbation.  Echo at that time showed EF down to 20-25%.  She was diuresed and discharged.   Patient was diagnosed with breast cancer in 2008 and had lumpectomy, radiation, and chemotherapy which included epirubicin, and anthracycline.  Recurrent cancer in 2019 with right mastectomy and chemo with cyclophosphamide , MTX, and fluorouracil .   RHC/LHC 06/2017: no significant CAD and mildly elevated PCWP. Cardiac MRI 07/2017: EF 49%, no LGE.   Echo 01/2020: EF 45%, GHK, mildly decreased RV systolic function.  Echo 03/2021: EF 55% with normal RV and IVC.    Patient was in atrial fibrillation with RVR in 02/2022, converted back to NSR with amiodarone .   Echo 03/2022: EF 40-45%, D-shaped septum, moderate RV dysfunction with mild RV enlargement, IVC normal.   05/2023 hospitalized for aspiration PNA  Follow up 06/2023, in atrial fibrillation and mildly volume overloaded. Lasix  increased to 40 daily, Farxiga  started and arranged for repeat echo and DCCV.   Echo 07/2023 EF 30-35%, RV mildly reduced. DCCV 08/06/23 to NSR  Echo (11/2023): EF 40%, normal RV, IVC normal Echo 12/2023: EF 50-55%, LV low normal function, grade I diastolic dysfunction. Normal RV   Admitted 12/2023 post fall on eliquis . Found to be hypotensive and bradycardic on arrival 2/2 polypharmacy. Fall resultant with SDH and L mandible fx. Briefly required dopamine . Echo EF 55%. Neurosurgery ok with Eliquis  at discharge. Discharged with Zio. Amiodarone , Farxiga , and spironolactone  were held at discharge. Started on midodrine .    Zio 01/2024 showed mostly NSR, no worrisome arrhythmias, mild bradycardia with average HR 57 bpm.  At last visit with APP overall she felt fine. She was working with PT and doing well. Used walker, with no SOB at that time. Denied palpitations, abnormal bleeding, CP, dizziness, edema, or PND/Orthopnea. She was sleeping in a hospital bed. Appetite ok. Weight at home 138-139 pounds. Reported taking all medications, uses Lasix  1-2x/week. Including Crestor . No regular Tylenol  use and she does not drink ETOH.   Today she returns to HF clinic for pharmacist medication titration. At last visit with APP, midodrine  was decreased to 2.5 mg TID. Overall she says she is feeling fine. Denies dizziness, lightheadedness, fatigue. No complaints of CP/palpitations. She says her breathing has been good, and is able to walk ~ 300 feet with walker and/or husband holding hand. Her weights at home range from 138-140 lbs, and she has not had to use furosemide  since last visit. No LEE present. She sleeps on a hospital bed which is able to be raised, but denies PND/orthopnea. Her appetite has been very good. Her husband helps keep up with her medications, and she has them blister-packed at her pharmacy.   HF Medications: Furosemide  20 mg PRN  Has the patient been experiencing any side effects to the medications prescribed?  No  Does the patient have any problems obtaining medications due to transportation or finances?   No, has Blue [employer] Medicare  Understanding of regimen: fair Understanding of indications: fair Potential of compliance: fair Patient understands to avoid NSAIDs. Patient understands  to avoid decongestants.    Pertinent Lab Values: 03/02/2024: Serum creatinine 1.32, BUN 23, Potassium 4.1, Sodium 139  Vital Signs: Weight: 141.4 lbs (last clinic weight: 131 lbs 6.4 oz) Blood pressure: 130/76 mmHg  Heart rate: 57 bpm   Assessment/Plan: 1.Chronic systolic CHF: Long-standing  cardiomyopathy, since at least 2014.  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  Could be due to epirubicin exposure with 2008 breast cancer treatment.  Cardiac MRI 07/2017: EF up to 49% with no LGE.  Echo 01/2020: EF 45% range.  Echo 03/2021: EF 55%.  Echo 03/2022: EF 40-45%, D-shaped septum, mod RV dysfunction with mild RV enlargement, IVC normal.  Back in atrial fibrillation 06/2023, echo 07/2023: EF 30-35%. Per MD note, reduction in EF potentially due to atrial fibrillation. Once back in NSR, echo 11/2023 with EF about 40%.   - NYHA class II/III.  Appears euvolemic on exam.  - GDMT limited by low BP and CKD.  - Continue Lasix  20 mg PRN  - Start Farxiga  10 mg daily  - Stop midodrine  2.5 mg TID. She will monitor for S/sx of hypotension. She checks her BP at home routinely.    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   - Off amiodarone   3. CKD stage 3: Baseline SCr 1.8.   4. Atrial fibrillation: Persistent.  S/p DC-CV 07/2023.  - Now off amiodarone  with persistent bradycardia, required dopamine  during 02/2024 admit.  - Continue Eliquis  5 mg BID.  5. Elevated LFTs: Mild, stable. - Now off amiodarone .  - She has been taking her rosuvastatin  10 mg daily.  Follow up with APP in 3 weeks.   [name], PharmD, BCPS, BCCP, CPP Heart Failure Clinic Pharmacist [phone]

## 2024-03-24 NOTE — Patient Instructions (Signed)
 It was a pleasure seeing you today!  MEDICATIONS: -We are changing your medications today -Stop midodrine  since your blood pressure has been normal.  -Restart Farxiga  (dapagliflozin ) 10 mg (1 tablet) daily -Call if you have questions about your medications.   NEXT APPOINTMENT: Return to clinic in 3 weeks with APP Clinic.  In general, to take care of your heart failure: -Limit your fluid intake to 2 Liters (half-gallon) per day.   -Limit your salt intake to ideally 2-3 grams (2000-3000 mg) per day. -Weigh yourself daily and record, and bring that weight diary to your next appointment.  (Weight gain of 2-3 pounds in 1 day typically means fluid weight.) -The medications for your heart are to help your heart and help you live longer.   -Please contact us  before stopping any of your heart medications.  Call the clinic at [phone] with questions or to reschedule future appointments.

## 2024-03-29 NOTE — Therapy (Signed)
 OUTPATIENT PHYSICAL THERAPY LOWER EXTREMITY EVALUATION   Patient Name: Tara [name] MRN: [id number] DOB:1945/11/30, 78 y.o., female Today's Date: 03/30/2024  END OF SESSION:  PT End of Session - 03/30/24 1303     Visit Number 1    Number of Visits 12    Date for PT Re-Evaluation 05/11/24    Authorization Type BCBS Medicare    Authorization Time Period please check auth submitted 7/9    PT Start Time 1303    PT Stop Time 1343    PT Time Calculation (min) 40 min    Activity Tolerance Patient tolerated treatment well    Behavior During Therapy [facility] for tasks assessed/performed          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   Angio-edema    Anxiety    Breast cancer (HCC) 2008   right - s/p lumpectomy->chemo, radiation   Depression    Dysrhythmia    hx SVT   Family history of colon cancer    Family history of prostate cancer    GERD (gastroesophageal reflux disease)    H/O: hysterectomy    Hiatal hernia    History of cancer chemotherapy    History of radiation therapy    Hyperlipidemia    Hypertension 01/25/2018   Nonischemic cardiomyopathy (HCC)    Personal history of chemotherapy    Personal history of radiation therapy 01/05/209   SVT (supraventricular tachycardia) (HCC)    short RP SVT documented 5/14   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HCC)    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)   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   Past Surgical History:  Procedure Laterality Date   ABDOMINAL HYSTERECTOMY  1994   fibroids,    BIOPSY  06/19/2016   Procedure: BIOPSY;  Surgeon: Lamar CHRISTELLA Hollingshead, MD;  Location: [facility];  Service: Endoscopy;;  gastric duodenum   BREAST BIOPSY Right 12/16/2006   malignant   BREAST EXCISIONAL BIOPSY Right 2018   benign lumpectomy   BREAST LUMPECTOMY Right 02/2007   BREAST LUMPECTOMY WITH RADIOACTIVE SEED  LOCALIZATION Right 07/29/2017   Procedure: RIGHT BREAST LUMPECTOMY WITH RADIOACTIVE SEED LOCALIZATION;  Surgeon: Gail Favorite, MD;  Location: [facility] OR;  Service: General;  Laterality: Right;   BREAST SURGERY Right 2008   lumpectomy, cancer   CARDIAC CATHETERIZATION     CARDIOVERSION N/A 08/06/2023   Procedure: CARDIOVERSION (CATH LAB);  Surgeon: Rolan Ezra RAMAN, MD;  Location: [facility] INVASIVE CV LAB;  Service: Cardiovascular;  Laterality: N/A;   CHOLECYSTECTOMY  1999   COLONOSCOPY  2008   Dr. Harvey: multiple polyps. Path not available at time of visit.    COLONOSCOPY WITH PROPOFOL  N/A 04/25/2014   Dr. harvey: Multiple tubular adenomas removed. Diverticulosis. Moderate internal hemorrhoids. Next colonoscopy planned for August 2018.   CRANIOTOMY Bilateral 03/24/2022   Procedure: Bifrontal craniotomy for resection of meningioma;  Surgeon: Gillie Duncans, MD;  Location: [facility] OR;  Service: Neurosurgery;  Laterality: Bilateral;   ESOPHAGOGASTRODUODENOSCOPY (EGD) WITH PROPOFOL  N/A 06/19/2016   Procedure: ESOPHAGOGASTRODUODENOSCOPY (EGD) WITH PROPOFOL ;  Surgeon: Lamar CHRISTELLA Hollingshead, MD;  Location: [facility];  Service: Endoscopy;  Laterality: N/A;   MASTECTOMY Right 2019   MASTECTOMY W/ SENTINEL NODE BIOPSY Right 06/09/2018   Procedure: RIGHT TOTAL MASTECTOMY WITH SENTINEL LYMPH NODE BIOPSY;  Surgeon: Gail Favorite, MD;  Location:  [facility] OR;  Service: General;  Laterality: Right;   POLYPECTOMY N/A 04/25/2014   Procedure: POLYPECTOMY;  Surgeon: Margo LITTIE Haddock, MD;  Location: [facility] ORS;  Service: Endoscopy;  Laterality: N/A;  Ascending and Decending Colon x3 , Transverse colon x2, rectal   PORTACATH PLACEMENT N/A 06/09/2018   Procedure: INSERTION PORT-A-CATH;  Surgeon: Gail Favorite, MD;  Location: [facility] OR;  Service: General;  Laterality: N/A;   RIGHT/LEFT HEART CATH AND CORONARY ANGIOGRAPHY N/A 07/10/2017   Procedure: RIGHT/LEFT HEART CATH AND CORONARY ANGIOGRAPHY;  Surgeon: Rolan Ezra RAMAN, MD;  Location: [facility]  INVASIVE CV LAB;  Service: Cardiovascular;  Laterality: N/A;   Patient Active Problem List   Diagnosis Date Noted   Encounter for removal of sutures 02/03/2024   Bilateral knee pain 01/22/2024   Fall 01/14/2024   Pain and swelling of left lower leg 08/12/2023   Acute pain of both knees 08/12/2023   Acute foot pain, left 08/12/2023   Elevated liver enzymes 08/02/2023   Encounter for immunization 08/02/2023   Encounter for Medicare annual examination with abnormal findings 08/02/2023   Community acquired pneumonia of left lower lobe of lung 06/19/2023   Dysuria 06/03/2023   Impaired mobility and ADLs 01/05/2023   IGT (impaired glucose tolerance) 01/05/2023   Non-ischemic cardiomyopathy (HCC) 01/05/2023   Constipation 10/26/2022   Paroxysmal atrial fibrillation (HCC) 09/20/2022   Chronic anticoagulation 09/20/2022   Hiatal hernia 09/20/2022   Aspiration pneumonia (HCC) 08/14/2022   Seizure disorder (HCC)    Lethargy 05/15/2022   Dysfunction of right cardiac ventricle 05/07/2022   Acute postoperative anemia due to expected blood loss 05/07/2022   Dysphagia 05/07/2022   Palliative care encounter    Acute respiratory failure with hypoxia (HCC)    Aspiration into airway    Pressure injury of skin 04/07/2022   Meningioma (HCC) 03/24/2022   Seizure (HCC) 03/15/2022   Atrial fibrillation, chronic (HCC) 03/15/2022   Protein-calorie malnutrition, moderate (HCC) 03/15/2022   Atrial fibrillation with RVR (HCC)    Metabolic acidosis 02/27/2022   Anxiety and depression 02/27/2022   Meningioma, cerebral (HCC) 02/27/2022   Normocytic anemia 02/27/2022   Acute renal failure (ARF) (HCC) 02/27/2022   Hemorrhoids 01/04/2021   Ankle deformity, right 09/12/2020   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   Port-A-Cath in place 09/28/2018   Colon adenomas 08/06/2018   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 (HCC) 06/09/2018   Hypertension 01/25/2018   AKI (acute  kidney injury) (HCC) 10/25/2016   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   Acute on chronic combined systolic and diastolic CHF (congestive heart failure) (HCC)    Demand ischemia (HCC)    Hypokalemia 09/29/2016   Prolonged Q-T interval on ECG 09/29/2016   Chronic kidney disease (CKD), stage III (moderate) (HCC) 09/29/2016   Leukocytosis 09/18/2016   Hospital discharge follow-up 07/06/2016   Abnormal CT scan, chest    Upper airway cough syndrome  vs Cough variant asthma  04/09/2016   H/O fall 03/23/2016   Chronic cough 03/17/2016   Seasonal allergies 08/08/2014   Family history of colon cancer 03/30/2014   Osteoporosis 02/14/2014   Metabolic syndrome X 02/05/2014   SVT (supraventricular tachycardia) (HCC) 03/14/2013   Chronic HFrEF (heart failure with reduced ejection fraction) (HCC) 03/14/2013   Vitamin D  deficiency 07/23/2010   Malignant neoplasm of right breast (HCC) 12/13/2007   Hyperlipidemia 12/13/2007   Anxiety state 12/13/2007   Depression, major, single episode, 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   GERD 12/13/2007    PCP:  Antonetta Rollene BRAVO MD  REFERRING PROVIDER: Antonetta Rollene BRAVO, MD  REFERRING DIAG: 8101181475 (ICD-10-CM) - Acute pain of both knees W19.XXXD (ICD-10-CM) - Fall, subsequent encounter  THERAPY DIAG:  Acute pain of both knees - Plan: PT plan of care cert/re-cert  Difficulty in walking, not elsewhere classified - Plan: PT plan of care cert/re-cert  Other abnormalities of gait and mobility - Plan: PT plan of care cert/re-cert  Impaired functional mobility, balance, and endurance - Plan: PT plan of care cert/re-cert  Rationale for Evaluation and Treatment: Rehabilitation  ONSET DATE: back in the spring; April or March  SUBJECTIVE:   SUBJECTIVE STATEMENT: Got up and go dizzy and fell back in spring; arrives with her husband who gives most of patient medical history.  She is wearing a gait belt and husband giving CGA; husband states someone is  with her at all times and uses CGA with gait belt when up and walking.    PERTINENT HISTORY: Afib Seizures Tumor removed 2 years ago  History of seizures  PAIN:  Are you having pain? Not today; just stiff in the morning  PRECAUTIONS: Fall  RED FLAGS: None   WEIGHT BEARING RESTRICTIONS: No  FALLS:  Has patient fallen in last 6 months? Yes. Number of falls 1  LIVING ENVIRONMENT: Lives with: lives with their spouse Lives in: House/apartment Stairs: No Has following equipment at home: Vannie - 2 wheeled and Ramped entry  OCCUPATION: retired  PLOF: Independent  PATIENT GOALS: no more falls  NEXT MD VISIT: end of the month  OBJECTIVE:  Note: Objective measures were completed at Evaluation unless otherwise noted.  DIAGNOSTIC FINDINGS:   PATIENT SURVEYS: (husband assists in answers) ABC scale: The Activities-Specific Balance Confidence (ABC) Scale 0% 10 20 30  40 50 60 70 80 90 100% No confidence<->completely confident  "How confident are you that you will not lose your balance or become unsteady when you . . .   40.6%                                                   Total: #/16 40.6%     COGNITION: Overall cognitive status: History of cognitive impairments - at baseline  Reports some memory loss after tumor removal    SENSATION: WFL  EDEMA:  None noted   POSTURE: rounded shoulders and forward head  PALPATION: No complaint of soreness today  LOWER EXTREMITY ROM:  Active ROM Right eval Left eval  Hip flexion    Hip extension    Hip abduction    Hip adduction    Hip internal rotation    Hip external rotation    Knee flexion    Knee extension    Ankle dorsiflexion    Ankle plantarflexion    Ankle inversion    Ankle eversion     (Blank rows = not tested)  LOWER EXTREMITY MMT:  MMT Right eval Left eval  Hip flexion 4+ 4  Hip extension    Hip abduction Seated 4 Seated 4-  Hip adduction    Hip internal rotation    Hip  external rotation    Knee flexion    Knee extension 5 4+  Ankle dorsiflexion Limited motion; 4+ Limited motion 4+  Ankle plantarflexion    Ankle inversion    Ankle eversion     (Blank rows = not  tested)   FUNCTIONAL TESTS:  5 times sit to stand: 23.83 sec using hands to push up to stand 2 minute walk test: 224 ft with gait belt and CGA  SLS unable  Tandem stance right foot back 15; left foot back 3 GAIT: Distance walked: 50 ft in gym Assistive device utilized: gait belt Level of assistance: CGA Comments: slight shuffling gait; left foot tends to abduct                                                                                                                                TREATMENT DATE: 03/30/24 physical therapy evaluation and HEP instruction     PATIENT EDUCATION:  Education details: Patient educated on exam findings, POC, scope of PT, HEP, and what to expect next visit. Person educated: Patient Education method: Explanation, Demonstration, and Handouts Education comprehension: verbalized understanding, returned demonstration, verbal cues required, and tactile cues required  HOME EXERCISE PROGRAM: Access Code: [email or web address] URL: [email or web address] Date: 03/30/2024 Prepared by: [facility] - Rehab  Exercises - Sit to Stand with Armchair  - 2 x daily - 7 x weekly - 2 sets - 5 reps - standing single leg balance at the counter (try to not hold on)  - 2 x daily - 7 x weekly - 1 sets - 5 reps - 15 sec hold - tandem stance balance; try not to hold on  - 2 x daily - 7 x weekly - 1 sets - 5 reps - 15 sec hold - Standing Hip Abduction with Counter Support  - 2 x daily - 7 x weekly - 2 sets - 10 reps  ASSESSMENT:  CLINICAL IMPRESSION: Patient is a 78 y.o. female who was seen today for physical therapy evaluation and treatment for M25.561,M25.562 (ICD-10-CM) - Acute pain of both knees W19.XXXD (ICD-10-CM) - Fall, subsequent encounter. Patient demonstrates decreased  strength, balance deficits and gait abnormalities which are negatively impacting patient ability to perform ADLs and functional mobility tasks. Patient will benefit from skilled physical therapy services to address these deficits to improve level of function with ADLs, functional mobility tasks, and reduce risk for falls.    OBJECTIVE IMPAIRMENTS: Abnormal gait, decreased activity tolerance, decreased balance, difficulty walking, decreased strength, and impaired perceived functional ability.   ACTIVITY LIMITATIONS: standing, stairs, and locomotion level  PARTICIPATION LIMITATIONS: shopping and community activity  PERSONAL FACTORS: history of seizure, afib are also affecting patient's functional outcome.   REHAB POTENTIAL: Good  CLINICAL DECISION MAKING: Evolving/moderate complexity  EVALUATION COMPLEXITY: Moderate   GOALS: Goals reviewed with patient? No  SHORT TERM GOALS: Target date: 04/20/2024 patient with caregiver assist will be independent with initial HEP  Baseline: Goal status: INITIAL  2.  Patient and caregiver will report 30% improvement overall  Baseline:  Goal status: INITIAL   LONG TERM GOALS: Target date: 05/11/2024  Patient will be independent in self management strategies to improve quality of life and functional outcomes Baseline:  Goal  status: INITIAL  2.  Patient and caregiver will report 50% improvement overall  Baseline:  Goal status: INITIAL  3.  Patient will able to stand SLS on each leg x 10 to demonstrate improved functional balance  Baseline: unable Goal status: INITIAL  4.  Patient will increase distance on 2 MWT by 50 ft  with SBA to demonstrate improved functional gait in community  Baseline: 224 ft with CGA and gait belt Goal status: INITIAL  5.   Patient will increase left leg MMT's to 4+ to 5/5 to allow navigation of steps without gait deviation or loss of balance  Baseline: see above Goal status: INITIAL  6.  Patient will  improve 5 times sit to stand score to 18 sec or less to demonstrate improved functional mobility and increased leg strength.    Baseline: 23.83 Goal status: INITIAL   PLAN:  PT FREQUENCY: 2x/week  PT DURATION: 6 weeks  PLANNED INTERVENTIONS: 97164- PT Re-evaluation, 97110-Therapeutic exercises, 97530- Therapeutic activity, 97112- Neuromuscular re-education, 97535- Self Care, 02859- Manual therapy, [phone]- Gait training, [phone]- Orthotic Fit/training, [phone]- Canalith repositioning, [id number]- Aquatic Therapy, [phone]- Splinting, [phone]- Wound care (first 20 sq cm), 97598- Wound care (each additional 20 sq cm)Patient/Family education, Balance training, Stair training, Taping, Dry Needling, Joint mobilization, Joint manipulation, Spinal manipulation, Spinal mobilization, Scar mobilization, and DME instructions.   PLAN FOR NEXT SESSION: dgi; functional strength and balance; left leg strengthening.  Patient currently needs CGA for safety with ambulation   2:01 PM, 03/30/24 Taeler Winning Small Saintclair Schroader MPT [facility] [place] [phone] Ph:[phone]  [employer] [facility] Authorization Request  Visit Dx Codes: M25.561, M25.562, R26.2, R26.89, Z74.09  Functional Tool Score: ABC 40.6%  For all possible CPT codes, reference the Planned Interventions line above.     Check all conditions that are expected to impact treatment: {Conditions expected to impact treatment:None of these apply

## 2024-03-30 ENCOUNTER — Other Ambulatory Visit: Payer: Self-pay

## 2024-03-30 ENCOUNTER — Ambulatory Visit (HOSPITAL_COMMUNITY): Attending: Family Medicine

## 2024-03-30 DIAGNOSIS — M25561 Pain in right knee: Secondary | ICD-10-CM | POA: Insufficient documentation

## 2024-03-30 DIAGNOSIS — R2689 Other abnormalities of gait and mobility: Secondary | ICD-10-CM | POA: Insufficient documentation

## 2024-03-30 DIAGNOSIS — R262 Difficulty in walking, not elsewhere classified: Secondary | ICD-10-CM | POA: Diagnosis not present

## 2024-03-30 DIAGNOSIS — W19XXXA Unspecified fall, initial encounter: Secondary | ICD-10-CM | POA: Diagnosis not present

## 2024-03-30 DIAGNOSIS — M25562 Pain in left knee: Secondary | ICD-10-CM | POA: Diagnosis not present

## 2024-03-30 DIAGNOSIS — Z7409 Other reduced mobility: Secondary | ICD-10-CM | POA: Insufficient documentation

## 2024-04-07 ENCOUNTER — Ambulatory Visit (INDEPENDENT_AMBULATORY_CARE_PROVIDER_SITE_OTHER): Payer: Self-pay | Admitting: Family Medicine

## 2024-04-07 ENCOUNTER — Encounter: Payer: Self-pay | Admitting: Family Medicine

## 2024-04-07 ENCOUNTER — Telehealth: Payer: Self-pay | Admitting: Family Medicine

## 2024-04-07 VITALS — BP 115/64 | HR 66 | Resp 16 | Wt 141.0 lb

## 2024-04-07 DIAGNOSIS — Z1211 Encounter for screening for malignant neoplasm of colon: Secondary | ICD-10-CM | POA: Insufficient documentation

## 2024-04-07 DIAGNOSIS — R350 Frequency of micturition: Secondary | ICD-10-CM | POA: Insufficient documentation

## 2024-04-07 DIAGNOSIS — R32 Unspecified urinary incontinence: Secondary | ICD-10-CM | POA: Diagnosis not present

## 2024-04-07 MED ORDER — SOLIFENACIN SUCCINATE 5 MG PO TABS: 5.0000 mg | ORAL_TABLET | Freq: Every day | ORAL | 2 refills | Status: DC

## 2024-04-07 NOTE — Telephone Encounter (Signed)
 Noted

## 2024-04-07 NOTE — Patient Instructions (Addendum)
 Follow-up as before, call if you need me sooner.  Clean-catch UA and culture today.  New medication , vesicare ,  started for incontinence.  If this does not work adequately I will  refer you to a urologist, we would address this at your next visit.  Please schedule bone density test at checkout.  Nurse  please order Cologuard test for patient and reinforce the importance of her getting this done for colon cancer screening.  Thanks for choosing [facility], we consider it a privelige to serve you.

## 2024-04-07 NOTE — Assessment & Plan Note (Signed)
 2 month history, r/o infection and treat for OAB

## 2024-04-07 NOTE — Telephone Encounter (Signed)
 FYI, Patient said she will wait and schedule her bone scan when she comes back in 2 weeks to see Dr Antonetta.

## 2024-04-07 NOTE — Progress Notes (Signed)
   MYLEEN BRAILSFORD     MRN: [id number]      DOB: 1946/08/08  Chief Complaint  Patient presents with   Urinary Incontinence    Complains of having urinary incontinence for last month or two, is getting more frequent     HPI Ms. Killman is here with above concern, Spouse states she urinates every 30 mins while awake and has problems with bed wetting overnight Styatyes stream seems normal and feels as though she empties completely Denies burning or pressure, denies flank pain , nausea or vomiting or fever Marked improvement in mobility  ROS Denies recent fever or chills. Denies sinus pressure, nasal congestion, ear pain or sore throat. Denies chest congestion, productive cough or wheezing. Denies chest pains, palpitations and leg swelling Denies abdominal pain, nausea, vomiting,diarrhea or constipation.   . Denies depression, anxiety or insomnia. Denies skin break down or rash.   PE  BP 115/64   Pulse 66   Resp 16   Wt 141 lb (64 kg)   SpO2 92%   BMI 30.51 kg/m   Patient alert and oriented and in no cardiopulmonary distress.  HEENT: No facial asymmetry, EOMI,     Neck supple .  Chest: Clear to auscultation bilaterally.  CVS: S1, S2 no murmurs, no S3.Regular rate.  ABD: Soft no renal angle or suprapubic tenderness.   Ext: No edema  MS: Adequate ROM spine, shoulders, hips and knees.  Skin: Intact, no ulcerations or rash noted.  Psych: Good eye contact,  not anxious or depressed appearing.  CNS: CN 2-12 intact, power,  .no focal deficits noted.   Assessment & Plan  Urinary frequency 2 month history, r/o infection and treat for OAB  Urinary incontinence Primarily at night , vesicare  low dose prescribed, if no response , urology eval  Screening for colon cancer Cologuard ordered

## 2024-04-07 NOTE — Assessment & Plan Note (Signed)
 Cologuard ordered

## 2024-04-07 NOTE — Assessment & Plan Note (Signed)
 Primarily at night , vesicare  low dose prescribed, if no response , urology eval

## 2024-04-08 ENCOUNTER — Ambulatory Visit: Payer: Medicare Other | Admitting: Allergy & Immunology

## 2024-04-08 ENCOUNTER — Ambulatory Visit: Payer: Self-pay | Admitting: Family Medicine

## 2024-04-08 ENCOUNTER — Other Ambulatory Visit: Payer: Self-pay

## 2024-04-08 ENCOUNTER — Encounter: Payer: Self-pay | Admitting: Allergy & Immunology

## 2024-04-08 VITALS — BP 110/70 | HR 66 | Temp 98.1°F | Resp 18 | Ht 62.21 in | Wt 139.2 lb

## 2024-04-08 DIAGNOSIS — I5022 Chronic systolic (congestive) heart failure: Secondary | ICD-10-CM | POA: Diagnosis not present

## 2024-04-08 DIAGNOSIS — I471 Supraventricular tachycardia, unspecified: Secondary | ICD-10-CM

## 2024-04-08 DIAGNOSIS — J302 Other seasonal allergic rhinitis: Secondary | ICD-10-CM

## 2024-04-08 DIAGNOSIS — J3089 Other allergic rhinitis: Secondary | ICD-10-CM | POA: Diagnosis not present

## 2024-04-08 MED ORDER — MONTELUKAST SODIUM 10 MG PO TABS: 10.0000 mg | ORAL_TABLET | Freq: Every day | ORAL | 3 refills | Status: AC

## 2024-04-08 MED ORDER — FLUTICASONE PROPIONATE 50 MCG/ACT NA SUSP: 2.0000 | Freq: Every day | NASAL | 3 refills | Status: AC

## 2024-04-08 NOTE — Progress Notes (Signed)
 FOLLOW UP  Date of Service/Encounter:  04/08/24   Assessment:   Chronic rhinitis - getting blood work, would consider doing intradermal testing in the future   Complicated past medical history, including congestive heart failure    Plan/Recommendations:   1. Perennial and seasonal allergic rhinitis (grasses, trees, weeds, indoor molds, outdoor molds, dust mites, and cockroach) - Continue with Singulair  (montelukast ) 10mg  daily, - Continue with the Flonase  (fluticasone ) one spray per nostril. - We can hold off on allergy  shots for now.  2. Return if symptoms worsen or fail to improve. I think that Dr. Antonetta could take over your medications. Call us  back if you think that you need us ! :-)   Subjective:   Tara [name] is a 78 y.o. female presenting today for follow up of  Chief Complaint  Patient presents with   Allergic Rhinitis     Tara [name] has a history of the following: Patient Active Problem List   Diagnosis Date Noted   Urinary frequency 04/07/2024   Urinary incontinence 04/07/2024   Screening for colon cancer 04/07/2024   Encounter for removal of sutures 02/03/2024   Bilateral knee pain 01/22/2024   Fall 01/14/2024   Pain and swelling of left lower leg 08/12/2023   Acute pain of both knees 08/12/2023   Acute foot pain, left 08/12/2023   Elevated liver enzymes 08/02/2023   Encounter for immunization 08/02/2023   Encounter for Medicare annual examination with abnormal findings 08/02/2023   Community acquired pneumonia of left lower lobe of lung 06/19/2023   Dysuria 06/03/2023   Impaired mobility and ADLs 01/05/2023   IGT (impaired glucose tolerance) 01/05/2023   Non-ischemic cardiomyopathy (HCC) 01/05/2023   Constipation 10/26/2022   Paroxysmal atrial fibrillation (HCC) 09/20/2022   Chronic anticoagulation 09/20/2022   Hiatal hernia 09/20/2022   Aspiration pneumonia (HCC) 08/14/2022   Seizure disorder (HCC)    Lethargy 05/15/2022   Dysfunction  of right cardiac ventricle 05/07/2022   Acute postoperative anemia due to expected blood loss 05/07/2022   Dysphagia 05/07/2022   Palliative care encounter    Acute respiratory failure with hypoxia (HCC)    Aspiration into airway    Pressure injury of skin 04/07/2022   Meningioma (HCC) 03/24/2022   Seizure (HCC) 03/15/2022   Atrial fibrillation, chronic (HCC) 03/15/2022   Protein-calorie malnutrition, moderate (HCC) 03/15/2022   Atrial fibrillation with RVR (HCC)    Metabolic acidosis 02/27/2022   Anxiety and depression 02/27/2022   Meningioma, cerebral (HCC) 02/27/2022   Normocytic anemia 02/27/2022   Acute renal failure (ARF) (HCC) 02/27/2022   Hemorrhoids 01/04/2021   Ankle deformity, right 09/12/2020   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   Port-A-Cath in place 09/28/2018   Colon adenomas 08/06/2018   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 (HCC) 06/09/2018   Hypertension 01/25/2018   AKI (acute kidney injury) (HCC) 10/25/2016   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   Acute on chronic combined systolic and diastolic CHF (congestive heart failure) (HCC)    Demand ischemia (HCC)    Hypokalemia 09/29/2016   Prolonged Q-T interval on ECG 09/29/2016   Chronic kidney disease (CKD), stage III (moderate) (HCC) 09/29/2016   Leukocytosis 09/18/2016   Hospital discharge follow-up 07/06/2016   Abnormal CT scan, chest    Upper airway cough syndrome  vs Cough variant asthma  04/09/2016   H/O fall 03/23/2016   Chronic cough 03/17/2016   Seasonal allergies 08/08/2014   Family history of colon cancer 03/30/2014   Osteoporosis  02/14/2014   Metabolic syndrome X 02/05/2014   SVT (supraventricular tachycardia) (HCC) 03/14/2013   Chronic HFrEF (heart failure with reduced ejection fraction) (HCC) 03/14/2013   Vitamin D  deficiency 07/23/2010   Malignant neoplasm of right breast (HCC) 12/13/2007   Hyperlipidemia 12/13/2007   Anxiety state 12/13/2007   Depression,  major, single episode, 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   GERD 12/13/2007    History obtained from: chart review and patient.  Discussed the use of AI scribe software for clinical note transcription with the patient and/or guardian, who gave verbal consent to proceed.  Tara [name] is a 78 y.o. female presenting for a follow up visit.  She was last seen in January 2025.  At that time, blood testing was positive to grass, trees, weeds, outdoor molds, dust mite, and cockroach.  She also had intradermal test that was positive to indoor mold.  We continue with Singulair  10 mg daily as well as Flonase .  We recommended holding off on allergy  shots since she was doing fairly well with medications alone.  Since last visit, she has done pretty well.  Since her last visit in January, she has experienced a decrease in the use of her nasal spray. She continues to take Singulair  (montelukast ) mostly every day without any reported side effects. She does not feel that allergy  shots are needed at this time. She feels fairly good with the medications only. This seems to be working well to control her allergic rhinitis symptoms.   No recent sinus infections, ear infections, or pneumonias have occurred. She has not experienced any new issues such as asthma or coughing.   Her heart condition is stable. She seems to be getting around well without any cardiac issues.     Otherwise, there have been no changes to her past medical history, surgical history, family history, or social history.    Review of systems otherwise negative other than that mentioned in the HPI.    Objective:   Blood pressure 110/70, pulse 66, temperature 98.1 F (36.7 C), temperature source Temporal, resp. rate 18, height 5' 2.21 (1.58 m), weight 139 lb 3.2 oz (63.1 kg), SpO2 93%. Body mass index is 25.29 kg/m.    Physical Exam Vitals reviewed.  Constitutional:      Appearance: She is well-developed.     Comments: Friendly.  Cooperative with the exam.   HENT:     Head: Normocephalic and atraumatic.     Right Ear: Tympanic membrane, ear canal and external ear normal. No drainage, swelling or tenderness. Tympanic membrane is not injected, scarred, erythematous, retracted or bulging.     Left Ear: Tympanic membrane, ear canal and external ear normal. No drainage, swelling or tenderness. Tympanic membrane is not injected, scarred, erythematous, retracted or bulging.     Nose: No nasal deformity, septal deviation, mucosal edema or rhinorrhea.     Right Turbinates: Enlarged, swollen and pale.     Left Turbinates: Enlarged, swollen and pale.     Right Sinus: No maxillary sinus tenderness or frontal sinus tenderness.     Left Sinus: No maxillary sinus tenderness or frontal sinus tenderness.     Mouth/Throat:     Mouth: Mucous membranes are not pale and not dry.     Pharynx: Uvula midline.  Eyes:     General:        Right eye: No discharge.        Left eye: No discharge.     Conjunctiva/sclera: Conjunctivae normal.     Right  eye: Right conjunctiva is not injected. No chemosis.    Left eye: Left conjunctiva is not injected. No chemosis.    Pupils: Pupils are equal, round, and reactive to light.  Cardiovascular:     Rate and Rhythm: Normal rate and regular rhythm.     Heart sounds: Normal heart sounds.  Pulmonary:     Effort: Pulmonary effort is normal. No tachypnea, accessory muscle usage or respiratory distress.     Breath sounds: Normal breath sounds. No wheezing, rhonchi or rales.  Chest:     Chest wall: No tenderness.  Abdominal:     Tenderness: There is no abdominal tenderness. There is no guarding or rebound.  Lymphadenopathy:     Head:     Right side of head: No submandibular, tonsillar or occipital adenopathy.     Left side of head: No submandibular, tonsillar or occipital adenopathy.     Cervical: No cervical adenopathy.  Skin:    Coloration: Skin is not pale.     Findings: No abrasion, erythema,  petechiae or rash. Rash is not papular, urticarial or vesicular.  Neurological:     Mental Status: She is alert.  Psychiatric:        Behavior: Behavior is cooperative.      Diagnostic studies: none     Marty Shaggy, MD  [facility] 

## 2024-04-08 NOTE — Patient Instructions (Addendum)
 1. Perennial and seasonal allergic rhinitis (grasses, trees, weeds, indoor molds, outdoor molds, dust mites, and cockroach) - Continue with Singulair  (montelukast ) 10mg  daily, - Continue with the Flonase  (fluticasone ) one spray per nostril. - We can hold off on allergy  shots for now.  2. Return if symptoms worsen or fail to improve. I think that Dr. Antonetta could take over your medications. Call us  back if you think that you need us ! :-)   Please inform us  of any Emergency Department visits, hospitalizations, or changes in symptoms. Call us  before going to the ED for breathing or allergy  symptoms since we might be able to fit you in for a sick visit. Feel free to contact us  anytime with any questions, problems, or concerns.  It was a pleasure to see you and your family again today!  Websites that have reliable patient information: 1. American Academy of Asthma, Allergy , and Immunology: www.aaaai.org 2. Food Allergy  Research and Education (FARE): foodallergy.org 3. Mothers of Asthmatics: http://www.asthmacommunitynetwork.org 4. [employer] of Allergy , Asthma, and Immunology: www.acaai.org      "Like" us  on Facebook and Instagram for our latest updates!      A healthy democracy works best when [employer] participate! Make sure you are registered to vote! If you have moved or changed any of your contact information, you will need to get this updated before voting! Scan the QR codes below to learn more!

## 2024-04-11 LAB — UA/M W/RFLX CULTURE, ROUTINE
Bilirubin, UA: NEGATIVE
Ketones, UA: NEGATIVE
Nitrite, UA: NEGATIVE
RBC, UA: NEGATIVE
Specific Gravity, UA: 1.024 (ref 1.005–1.030)
Urobilinogen, Ur: 1 mg/dL (ref 0.2–1.0)
pH, UA: 6.5 (ref 5.0–7.5)

## 2024-04-11 LAB — MICROSCOPIC EXAMINATION
Casts: NONE SEEN /LPF
RBC, Urine: NONE SEEN /HPF (ref 0–2)
WBC, UA: >30 /HPF — AB (ref 0–5)

## 2024-04-11 LAB — URINE CULTURE, REFLEX

## 2024-04-11 MED ORDER — AMPICILLIN 500 MG PO CAPS: ORAL_CAPSULE | ORAL | 0 refills | Status: DC

## 2024-04-12 ENCOUNTER — Telehealth (HOSPITAL_COMMUNITY): Payer: Self-pay

## 2024-04-12 NOTE — Telephone Encounter (Signed)
 Called to confirm/remind patient of their appointment at the [facility] on 04/13/24.   Appointment:   [x] Confirmed  [] Left mess   [] No answer/No voice mail  [] VM Full/unable to leave message  [] Phone not in service  Patient reminded to bring all medications and/or complete list.  Confirmed patient has transportation. Gave directions, instructed to utilize valet parking.

## 2024-04-13 ENCOUNTER — Ambulatory Visit (HOSPITAL_COMMUNITY)
Admission: RE | Admit: 2024-04-13 | Discharge: 2024-04-13 | Disposition: A | Discharge status: Home / Self Care | Source: Ambulatory Visit | Attending: Family Medicine | Admitting: Family Medicine

## 2024-04-13 ENCOUNTER — Encounter (HOSPITAL_COMMUNITY): Payer: Self-pay

## 2024-04-13 ENCOUNTER — Ambulatory Visit (HOSPITAL_COMMUNITY): Payer: Self-pay | Admitting: Family Medicine

## 2024-04-13 ENCOUNTER — Telehealth: Payer: Self-pay

## 2024-04-13 ENCOUNTER — Ambulatory Visit (HOSPITAL_COMMUNITY)

## 2024-04-13 VITALS — BP 146/70 | HR 66 | Ht <= 58 in | Wt 141.0 lb

## 2024-04-13 DIAGNOSIS — Z79899 Other long term (current) drug therapy: Secondary | ICD-10-CM | POA: Insufficient documentation

## 2024-04-13 DIAGNOSIS — W19XXXA Unspecified fall, initial encounter: Secondary | ICD-10-CM | POA: Diagnosis not present

## 2024-04-13 DIAGNOSIS — N183 Chronic kidney disease, stage 3 unspecified: Secondary | ICD-10-CM | POA: Insufficient documentation

## 2024-04-13 DIAGNOSIS — I480 Paroxysmal atrial fibrillation: Secondary | ICD-10-CM | POA: Diagnosis not present

## 2024-04-13 DIAGNOSIS — I5022 Chronic systolic (congestive) heart failure: Secondary | ICD-10-CM | POA: Diagnosis not present

## 2024-04-13 DIAGNOSIS — Z7409 Other reduced mobility: Secondary | ICD-10-CM | POA: Diagnosis not present

## 2024-04-13 DIAGNOSIS — I4819 Other persistent atrial fibrillation: Secondary | ICD-10-CM | POA: Diagnosis not present

## 2024-04-13 DIAGNOSIS — Z7984 Long term (current) use of oral hypoglycemic drugs: Secondary | ICD-10-CM | POA: Diagnosis not present

## 2024-04-13 DIAGNOSIS — M25562 Pain in left knee: Secondary | ICD-10-CM

## 2024-04-13 DIAGNOSIS — Z86718 Personal history of other venous thrombosis and embolism: Secondary | ICD-10-CM | POA: Insufficient documentation

## 2024-04-13 DIAGNOSIS — N1832 Chronic kidney disease, stage 3b: Secondary | ICD-10-CM

## 2024-04-13 DIAGNOSIS — I428 Other cardiomyopathies: Secondary | ICD-10-CM | POA: Diagnosis not present

## 2024-04-13 DIAGNOSIS — Z853 Personal history of malignant neoplasm of breast: Secondary | ICD-10-CM | POA: Insufficient documentation

## 2024-04-13 DIAGNOSIS — Z923 Personal history of irradiation: Secondary | ICD-10-CM | POA: Insufficient documentation

## 2024-04-13 DIAGNOSIS — R262 Difficulty in walking, not elsewhere classified: Secondary | ICD-10-CM

## 2024-04-13 DIAGNOSIS — Z7901 Long term (current) use of anticoagulants: Secondary | ICD-10-CM | POA: Insufficient documentation

## 2024-04-13 DIAGNOSIS — Z9011 Acquired absence of right breast and nipple: Secondary | ICD-10-CM | POA: Diagnosis not present

## 2024-04-13 DIAGNOSIS — R5381 Other malaise: Secondary | ICD-10-CM | POA: Insufficient documentation

## 2024-04-13 DIAGNOSIS — M25561 Pain in right knee: Secondary | ICD-10-CM | POA: Diagnosis not present

## 2024-04-13 DIAGNOSIS — R2689 Other abnormalities of gait and mobility: Secondary | ICD-10-CM

## 2024-04-13 DIAGNOSIS — R7989 Other specified abnormal findings of blood chemistry: Secondary | ICD-10-CM | POA: Insufficient documentation

## 2024-04-13 DIAGNOSIS — Z9221 Personal history of antineoplastic chemotherapy: Secondary | ICD-10-CM | POA: Insufficient documentation

## 2024-04-13 LAB — BASIC METABOLIC PANEL WITH GFR
Anion gap: 10 (ref 5–15)
BUN: 27 mg/dL — ABNORMAL HIGH (ref 8–23)
CO2: 22 mmol/L (ref 22–32)
Calcium: 8.9 mg/dL (ref 8.9–10.3)
Chloride: 106 mmol/L (ref 98–111)
Creatinine, Ser: 1.35 mg/dL — ABNORMAL HIGH (ref 0.44–1.00)
GFR, Estimated: 40 mL/min — ABNORMAL LOW (ref >60)
Glucose, Bld: 79 mg/dL (ref 70–99)
Potassium: 4.1 mmol/L (ref 3.5–5.1)
Sodium: 138 mmol/L (ref 135–145)

## 2024-04-13 MED ORDER — SPIRONOLACTONE 25 MG PO TABS: 12.5000 mg | ORAL_TABLET | Freq: Every day | ORAL | 3 refills | Status: AC

## 2024-04-13 NOTE — Progress Notes (Addendum)
 [facility] CLIN[facility] NOTE  PCP: Dr. Antonetta Cardiology: Dr. Kelsie HF Cardiology: Dr. Rolan  78 y.o. AAF with chronic systolic HF, hx of SVT, CKD stage 3 and persistent atrial fibrillation.  Dr. Kelsie has followed her for a short RP SVT.    Patient was initially found to have low EF in 2014. Echo 6/17: EF 35-40%. Cardiolite in 8/1: fixed apical defect but no evidence for ischemia. She was admitted in 1/18 with PNA, DVT, and CHF exacerbation.  Echo at that time showed EF down to 20-25%.  She was diuresed and discharged.   Patient was diagnosed with breast cancer in 2008 and had lumpectomy, radiation, and chemotherapy which included epirubicin, and anthracycline.  Recurrent cancer in 2019 with right mastectomy and chemo with cyclophosphamide , MTX, and fluorouracil .   RHC/LHC 10/18: no significant CAD and mildly elevate PCWP.  Cardiac MRI 11/18: EF 49%, no LGE.   Echo 5/21: EF 45%, GHK, mildly decreased RV systolic function.  Echo 7/22: EF 55% with normal RV and IVC.    Patient was in atrial fibrillation with RVR in 6/23, converted back to NSR with amiodarone .   Echo 7/23: EF 40-45%, D-shaped septum, moderate RV dysfunction with mild RV enlargement, IVC normal.   9/24 hospitalized for aspiration PNA  Follow up 10/24, in atrial fibrillation and mildly volume overloaded. Lasix  increased to 40 daily, Farxiga  started and arranged for repeat echo and DCCV.   Echo 11/24 EF 30-35%, RV mildly reduced.  DCCV 08/06/23 to NSR   Echo (3/25): EF 40%, normal RV, IVC normal  Echo 4/25 EF 50-55%, normal RV   Admitted 4/25 post fall on eliquis . Found to be hypotensive and bradycardic on arrival 2/2 polypharmacy. Fall resultant with SDH and L mandible fx. Briefly required dopamine . Echo EF 55%. Neurosurgery ok with Eliquis  at discharge. Discharged with Zio. Amio/Farxiga  and spiro held at discharge. Started on midodrine .   Zio 5/25 showed mostly NSR, no worrisome arrhythmias, mild bradycardia with  average HR 57 bpm.  Today she returns for HF follow up with her husband. Overall feeling fine. She has SOB walking further distances uphill, otherwise no dyspnea working with PT. She not longer requires her walker or WC. Denies palpitations, abnormal bleeding, CP, dizziness, edema, or PND/Orthopnea. Appetite ok. Weight at home 138-140 pounds. Taking all medications. Currently on abx for asymptomatic UTI. Has not needed Lasix .  ReDs reading: 29 %, normal  ECG (personally reviewed): none ordered today.   Labs (10/24): K 4.3, creatinine 1.95, AST 54, ALT 46, hgb 9.8, BNP 392 Labs (1/25): K 4.3, creatinine 1.81 Labs (3/25): LDL 169 Labs (5/25): K 4.6, creatinine 1.51, AST 56, ALT 56 Labs (6/25): K 4.4, creatinine 1.26, LFTs trending down, LDL 105   PMH: 1. SVT: Short R-P SVT, followed by Dr. Kelsie.  2. DVT in 1/18 in setting of PNA.  She was on Xarelto  for about 6 months.  3. Breast cancer: Diagnosed on right in 2008.  She had lumpectomy, radiation, and chemotherapy.  She had fluorouracil , epirubicin, Cytoxan .  - Recent mammogram with right breast calcifications => biopsy with complex sclerosing lesion.  - Recurrent cancer 2019, she had right mastectomy and chemotherapy with cyclophosphamide , MTX, fluorouracil .  4. GERD 5. Hyperlipidemia 6. Chronic systolic CHF: Uncertain cause of cardiomyopathy.  Echo in 2014 showed EF 40%.  It is possible that it is related to epirubicin use (anthracycline).  - Echo (6/17): EF 35-40%, mild LV dilation.  - Cardiolite (8/17): no ischemia, apical lateral/apical fixed perfusion defect.  -  Echo (1/18): EF 20-25%, moderate MR, severe TR, D-shaped IV septum, PASP 35 mmHg.  - RHC/LHC (10/18): No angiographic CAD.  Mean RA 5, PA 44/11 mean 26, mean PCWP 18, CI 3.49.  - Cardiac MRI (11/18): EF 49%, diffuse mild hypokinesis, mild RV dilation with normal function, no LGE.  - Echo (5/21): EF 45%, global hypokinesis, mildly decreased RV systolic function.  - Echo  (7/22): EF 55% with normal RV and IVC. - Echo (7/23): EF 40-45%, D-shaped septum, moderate RV dysfunction with mild RV enlargement, IVC normal.  - Echo (11/24): EF 30-35%, RV systolic function reduced, moderate MR and moderate to severe TR - Echo (3/25): EF 40%, normal RV, IVC normal - Echo (4/25): EF 50-55%, normal RV  7. Atrial fibrillation: First noted in 6/23. S/p DCCV 11/24. - Zio (5/25): mostly NSR, no worrisome arrhythmias, mild bradycardia with average HR 57 bpm. 8. Meningioma: Resection in 6/23.  9. Aspiration PNA x 3 episodes. 10. CKD stage 3  FH: Colon cancer.  No known heart disease.   SH: Married, never smoked.  Lives in [place].  Retired Engineer, civil (consulting).    ROS: All systems reviewed and negative except as per HPI.   Current Outpatient Medications  Medication Sig Dispense Refill   acetaminophen  (TYLENOL ) 500 MG tablet Take 1,000 mg by mouth every 6 (six) hours as needed for moderate pain (pain score 4-6) or mild pain (pain score 1-3).     apixaban  (ELIQUIS ) 5 MG TABS tablet Take 1 tablet (5 mg total) by mouth 2 (two) times daily. 60 tablet 6   clonazePAM  (KLONOPIN ) 0.5 MG tablet Take 1 tablet (0.5 mg total) by mouth at bedtime. 30 tablet 5   dapagliflozin  propanediol (FARXIGA ) 10 MG TABS tablet Take 1 tablet (10 mg total) by mouth daily. 30 tablet 11   fluticasone  (FLONASE ) 50 MCG/ACT nasal spray Place 2 sprays into both nostrils daily. (Patient taking differently: Place 2 sprays into both nostrils daily as needed for allergies or rhinitis.) 48 g 3   furosemide  (LASIX ) 20 MG tablet Take 1 tablet (20 mg total) by mouth daily as needed (for wt gain > 3 lb in 24h or > 5 lb in 1wk). 30 tablet 11   levETIRAcetam  (KEPPRA ) 500 MG tablet Take 1 tablet (500 mg total) by mouth 2 (two) times daily. 180 tablet 3   montelukast  (SINGULAIR ) 10 MG tablet Take 1 tablet (10 mg total) by mouth at bedtime. 90 tablet 3   pantoprazole  (PROTONIX ) 40 MG tablet TAKE ONE TABLET BY MOUTH TWICE DAILY 60 tablet  0   potassium chloride  SA (KLOR-CON  M) 20 MEQ tablet Take 1 tablet (20 mEq total) by mouth daily as needed (when take furosemide ). 30 tablet 11   rosuvastatin  (CRESTOR ) 10 MG tablet Take 1 tablet (10 mg total) by mouth daily. 90 tablet 3   sertraline  (ZOLOFT ) 25 MG tablet TAKE 1 TABLET BY MOUTH ONCE A DAY. (Patient taking differently: Take 25 mg by mouth as needed (seasonal).) 30 tablet 0   ampicillin  (PRINCIPEN) 500 MG capsule Take one capsule by mouth twice daily for 5 days (Patient not taking: Reported on 04/13/2024) 10 capsule 0   bisacodyl  5 MG EC tablet One tablet every 3 days, if needed, for constipation (Patient not taking: Reported on 04/13/2024) 30 tablet 1   docusate sodium  (COLACE) 100 MG capsule Take 100 mg by mouth 2 (two) times daily. As needed (Patient not taking: Reported on 04/13/2024)     polyethylene glycol (MIRALAX ) 17 g packet Take  one packet mira lax once daily in 8 ounces water  (Patient not taking: Reported on 04/13/2024) 28 each 5   solifenacin  (VESICARE ) 5 MG tablet Take 1 tablet (5 mg total) by mouth daily. (Patient not taking: Reported on 04/13/2024) 30 tablet 2   No current facility-administered medications for this encounter.   Wt Readings from Last 3 Encounters:  04/13/24 64 kg (141 lb)  04/08/24 63.1 kg (139 lb 3.2 oz)  04/07/24 64 kg (141 lb)   BP (!) 146/70   Pulse 66   Ht 4' 9 (1.448 m)   Wt 64 kg (141 lb)   SpO2 97%   BMI 30.51 kg/m   Physical Exam General:  NAD. No resp difficulty, walked into clinic, elderly HEENT: Normal Neck: Supple. No JVD. Cor: Regular rate & rhythm. No rubs, gallops or murmurs. Lungs: Clear Abdomen: Soft, nontender, nondistended.  Extremities: No cyanosis, clubbing, rash, edema Neuro: Alert & oriented x 3, moves all 4 extremities w/o difficulty. Affect pleasant.  Assessment/Plan: 1. Chronic systolic CHF: Long-standing cardiomyopathy, since at least 2014.  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  Could be due  to epirubicin exposure with 2008 breast cancer treatment.  Cardiac MRI 11/18: EF up to 49% with no LGE.  Echo 5/21: EF 45% range.  Echo 7/22: EF 55%.  Echo 7/23: EF 40-45%, D-shaped septum, mod RV dysfunction with mild RV enlargement, IVC normal.  Back in atrial fibrillation 10/24, echo 11/24: EF 30-35%. ? If reduction in EF due to atrial fibrillation. Once back in NSR, echo 3/25 with EF about 40%. Improved NYHA II symptoms, she is not volume overloaded on exam. ReDs 29%. GDMT recently limited by low BP and CKD, recently improved.  - Restart spiro 12.5 mg daily.  BMET today, repeat in 1 week. - Continue Farxiga  10 mg daily. - Continue Lasix  20 mg daily PRN.  - Add losartan  next, if renal function and BP allow. - Consider Cardiac Rehab once graduated from [facility]. 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   - Off amiodarone  3. CKD stage 3: Baseline SCr 1.8.  - BMET today. Follow renal function closely with addition of MRA. 4. Atrial fibrillation: Persistent.  S/p DC-CV 11/24. Zio 5/25 showed mostly NSR, no worrisome arrhythmias. Regular on exam today. - Now off amiodarone  with persistent bradycardia requiring dopamine  during recent admit.  - Continue Eliquis  5 mg bid. No bleeding issues. Recent CBC stable 5. Elevated LFTs: Mild, stable. ? Due to CHF, ? due to amiodarone .  - Now off amiodarone . Most recent LFTs normal AST 44, ALT 37 - She has been taking her rosuvastatin  10 mg daily.   6. Debility: Continue PT.   Follow up in 3-4 months with Dr. Rolan Raisin [facility] FNP-BC 04/13/2024

## 2024-04-13 NOTE — Telephone Encounter (Signed)
 Copied from CRM #[id number]. Topic: Clinical - Request for Lab/Test Order >> Apr 13, 2024 11:56 AM Treva T wrote: Reason for CRM: Jenna, nurse with [facility] department, calling inquiring if patient can have labs drawn at her upcoming appointment with provider on 04/20/24.  Labs requested are BMP, (Basic Metabolic Panel).  Caller can be reached at [phone], option 2 to confirm. Requesting, labs be forwarded to Heart Failure office, via fax # [phone].

## 2024-04-13 NOTE — Patient Instructions (Signed)
 Medication Changes:  START: SPIRONOLACTONE  12.5MG  ONCE DAILY   Lab Work:  Labs done today, your results will be available in MyChart, we will contact you for abnormal readings.  THEN PLEASE HAVE LABS DRAWN AT YOUR PCP APPOINTMENT IN 1 WEEK (BMET)   Follow-Up in: 4 MONTHS PLEASE CALL OUR OFFICE AROUND SEPTEMBER TO GET SCHEDULED FOR YOUR APPOINTMENT. PHONE NUMBER IS [phone] OPTION 2  \ At the [facility], you and your health needs are our priority. We have a designated team specialized in the treatment of Heart Failure. This Care Team includes your primary Heart Failure Specialized Cardiologist (physician), Advanced Practice Providers (APPs- Physician Assistants and Nurse Practitioners), and Pharmacist who all work together to provide you with the care you need, when you need it.   You may see any of the following providers on your designated Care Team at your next follow up:  Dr. Toribio Fuel Dr. Ezra Shuck Dr. Ria Commander Dr. Odis Brownie Greig Mosses, NP Caffie Shed, [place] [facility] [place], [place] Beckey Coe, NP [place] Lee, NP [name], PharmD   Please be sure to bring in all your medications bottles to every appointment.   Need to Contact Us :  If you have any questions or concerns before your next appointment please send us  a message through [place] or call our office at [phone].    TO LEAVE A MESSAGE FOR THE NURSE SELECT OPTION 2, PLEASE LEAVE A MESSAGE INCLUDING: YOUR NAME DATE OF BIRTH CALL BACK NUMBER REASON FOR CALL**this is important as we prioritize the call backs  YOU WILL RECEIVE A CALL BACK THE SAME DAY AS LONG AS YOU CALL BEFORE 4:00 PM

## 2024-04-13 NOTE — Progress Notes (Signed)
 ReDS Vest / Clip - 04/13/24 1117       ReDS Vest / Clip   Station Marker A    Ruler Value 33    ReDS Value Range Low volume    ReDS Actual Value 29

## 2024-04-13 NOTE — Therapy (Signed)
 OUTPATIENT PHYSICAL THERAPY LOWER EXTREMITY TREATMENT   Patient Name: Tara [name] MRN: [id number] DOB:[birth year], 78 y.o., female Today's Date: 04/13/2024  END OF SESSION:  PT End of Session - 04/13/24 1314     Visit Number 2    Number of Visits 12    Date for PT Re-Evaluation 05/11/24    Authorization Type BCBS Medicare    Authorization Time Period no auth required    PT Start Time 1314    PT Stop Time 1345    PT Time Calculation (min) 31 min    Activity Tolerance Patient tolerated treatment well    Behavior During Therapy [facility] for tasks assessed/performed          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   Angio-edema    Anxiety    Breast cancer (HCC) 2008   right - s/p lumpectomy->chemo, radiation   Depression    Dysrhythmia    hx SVT   Family history of colon cancer    Family history of prostate cancer    GERD (gastroesophageal reflux disease)    H/O: hysterectomy    Hiatal hernia    History of cancer chemotherapy    History of radiation therapy    Hyperlipidemia    Hypertension 01/25/2018   Nonischemic cardiomyopathy (HCC)    Personal history of chemotherapy    Personal history of radiation therapy 01/05/209   SVT (supraventricular tachycardia) (HCC)    short RP SVT documented 5/14   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HCC)    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)   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   Past Surgical History:  Procedure Laterality Date   ABDOMINAL HYSTERECTOMY  1994   fibroids,    BIOPSY  06/19/2016   Procedure: BIOPSY;  Surgeon: Lamar CHRISTELLA Hollingshead, MD;  Location: [facility];  Service: Endoscopy;;  gastric duodenum   BREAST BIOPSY Right 12/16/2006   malignant   BREAST EXCISIONAL BIOPSY Right 2018   benign lumpectomy   BREAST LUMPECTOMY Right 02/2007   BREAST LUMPECTOMY WITH RADIOACTIVE SEED LOCALIZATION  Right 07/29/2017   Procedure: RIGHT BREAST LUMPECTOMY WITH RADIOACTIVE SEED LOCALIZATION;  Surgeon: Gail Favorite, MD;  [facility];  Service: General;  Laterality: Right;   BREAST SURGERY Right 2008   lumpectomy, cancer   CARDIAC CATHETERIZATION     CARDIOVERSION N/A 08/06/2023   Procedure: CARDIOVERSION (CATH LAB);  Surgeon: Rolan Ezra RAMAN, MD;  Location: [facility] INVASIVE CV LAB;  Service: Cardiovascular;  Laterality: N/A;   CHOLECYSTECTOMY  1999   COLONOSCOPY  2008   Dr. Harvey: multiple polyps. Path not available at time of visit.    COLONOSCOPY WITH PROPOFOL  N/A 04/25/2014   Dr. harvey: Multiple tubular adenomas removed. Diverticulosis. Moderate internal hemorrhoids. Next colonoscopy planned for August 2018.   CRANIOTOMY Bilateral 03/24/2022   Procedure: Bifrontal craniotomy for resection of meningioma;  Surgeon: Gillie Duncans, MD;  Location: [facility] OR;  Service: Neurosurgery;  Laterality: Bilateral;   ESOPHAGOGASTRODUODENOSCOPY (EGD) WITH PROPOFOL  N/A 06/19/2016   Procedure: ESOPHAGOGASTRODUODENOSCOPY (EGD) WITH PROPOFOL ;  Surgeon: Lamar CHRISTELLA Hollingshead, MD;  [facility];  Service: Endoscopy;  Laterality: N/A;   MASTECTOMY Right 2019   MASTECTOMY W/ SENTINEL NODE BIOPSY Right 06/09/2018   Procedure: RIGHT TOTAL MASTECTOMY WITH SENTINEL LYMPH NODE BIOPSY;  Surgeon: Gail Favorite, MD;  Location: [facility] OR;  Service: General;  Laterality: Right;   POLYPECTOMY N/A 04/25/2014   Procedure: POLYPECTOMY;  Surgeon: Margo LITTIE Haddock, MD;  Location: [facility] ORS;  Service: Endoscopy;  Laterality: N/A;  Ascending and Decending Colon x3 , Transverse colon x2, rectal   PORTACATH PLACEMENT N/A 06/09/2018   Procedure: INSERTION PORT-A-CATH;  Surgeon: Gail Favorite, MD;  Location: [facility] OR;  Service: General;  Laterality: N/A;   RIGHT/LEFT HEART CATH AND CORONARY ANGIOGRAPHY N/A 07/10/2017   Procedure: RIGHT/LEFT HEART CATH AND CORONARY ANGIOGRAPHY;  Surgeon: Rolan Ezra RAMAN, MD;  Location: [facility] INVASIVE CV  LAB;  Service: Cardiovascular;  Laterality: N/A;   Patient Active Problem List   Diagnosis Date Noted   Urinary frequency 04/07/2024   Urinary incontinence 04/07/2024   Screening for colon cancer 04/07/2024   Encounter for removal of sutures 02/03/2024   Bilateral knee pain 01/22/2024   Fall 01/14/2024   Pain and swelling of left lower leg 08/12/2023   Acute pain of both knees 08/12/2023   Acute foot pain, left 08/12/2023   Elevated liver enzymes 08/02/2023   Encounter for immunization 08/02/2023   Encounter for Medicare annual examination with abnormal findings 08/02/2023   Community acquired pneumonia of left lower lobe of lung 06/19/2023   Dysuria 06/03/2023   Impaired mobility and ADLs 01/05/2023   IGT (impaired glucose tolerance) 01/05/2023   Non-ischemic cardiomyopathy (HCC) 01/05/2023   Constipation 10/26/2022   Paroxysmal atrial fibrillation (HCC) 09/20/2022   Chronic anticoagulation 09/20/2022   Hiatal hernia 09/20/2022   Aspiration pneumonia (HCC) 08/14/2022   Seizure disorder (HCC)    Lethargy 05/15/2022   Dysfunction of right cardiac ventricle 05/07/2022   Acute postoperative anemia due to expected blood loss 05/07/2022   Dysphagia 05/07/2022   Palliative care encounter    Acute respiratory failure with hypoxia (HCC)    Aspiration into airway    Pressure injury of skin 04/07/2022   Meningioma (HCC) 03/24/2022   Seizure (HCC) 03/15/2022   Atrial fibrillation, chronic (HCC) 03/15/2022   Protein-calorie malnutrition, moderate (HCC) 03/15/2022   Atrial fibrillation with RVR (HCC)    Metabolic acidosis 02/27/2022   Anxiety and depression 02/27/2022   Meningioma, cerebral (HCC) 02/27/2022   Normocytic anemia 02/27/2022   Acute renal failure (ARF) (HCC) 02/27/2022   Hemorrhoids 01/04/2021   Ankle deformity, right 09/12/2020   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   Port-A-Cath in place 09/28/2018   Colon adenomas 08/06/2018   Malignant neoplasm  of midline of right female breast (HCC) 06/09/2018   Hypertension 01/25/2018   AKI (acute kidney injury) (HCC) 10/25/2016   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   Acute on chronic combined systolic and diastolic CHF (congestive heart failure) (HCC)    Demand ischemia (HCC)    Hypokalemia 09/29/2016   Prolonged Q-T interval on ECG 09/29/2016   Chronic kidney disease (CKD), stage III (moderate) (HCC) 09/29/2016   Leukocytosis 09/18/2016   Hospital discharge follow-up 07/06/2016   Abnormal CT scan, chest    Upper airway cough syndrome  vs Cough variant asthma  04/09/2016   H/O fall 03/23/2016   Chronic cough 03/17/2016   Seasonal allergies 08/08/2014   Family history of colon cancer 03/30/2014   Osteoporosis 02/14/2014   Metabolic syndrome X 02/05/2014   SVT (supraventricular tachycardia) (HCC) 03/14/2013   Chronic HFrEF (heart failure with reduced ejection fraction) (HCC) 03/14/2013   Vitamin D  deficiency 07/23/2010   Malignant neoplasm of right breast (HCC) 12/13/2007   Hyperlipidemia 12/13/2007   Anxiety state 12/13/2007   Depression, major, single  episode, 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   GERD 12/13/2007    PCP: Antonetta Rollene BRAVO MD  REFERRING PROVIDER: Antonetta Rollene BRAVO, MD  REFERRING DIAG: [phone] (ICD-10-CM) - Acute pain of both knees W19.XXXD (ICD-10-CM) - Fall, subsequent encounter  THERAPY DIAG:  Acute pain of both knees  Difficulty in walking, not elsewhere classified  Other abnormalities of gait and mobility  Impaired functional mobility, balance, and endurance  Rationale for Evaluation and Treatment: Rehabilitation  ONSET DATE: back in the spring; April or March  SUBJECTIVE:   SUBJECTIVE STATEMENT: Pt reports she feels okay today. Reports there should be somebody there with me to do *HEP* but if not I do them anyway   EVAL: Got up and go dizzy and fell back in spring; arrives with her husband who gives most of patient medical history.   She is wearing a gait belt and husband giving CGA; husband states someone is with her at all times and uses CGA with gait belt when up and walking.    PERTINENT HISTORY: Afib Seizures Tumor removed 2 years ago  History of seizures  PAIN:  Are you having pain? Not today; just stiff in the morning  PRECAUTIONS: Fall  RED FLAGS: None   WEIGHT BEARING RESTRICTIONS: No  FALLS:  Has patient fallen in last 6 months? Yes. Number of falls 1  LIVING ENVIRONMENT: Lives with: lives with their spouse Lives in: House/apartment Stairs: No Has following equipment at home: Vannie - 2 wheeled and Ramped entry  OCCUPATION: retired  PLOF: Independent  PATIENT GOALS: no more falls  NEXT MD VISIT: end of the month  OBJECTIVE:  Note: Objective measures were completed at Evaluation unless otherwise noted.  DIAGNOSTIC FINDINGS:   PATIENT SURVEYS: (husband assists in answers) ABC scale: The Activities-Specific Balance Confidence (ABC) Scale 0% 10 20 30  40 50 60 70 80 90 100% No confidence<->completely confident  "How confident are you that you will not lose your balance or become unsteady when you . . .   40.6%                                                   Total: #/16 40.6%     COGNITION: Overall cognitive status: History of cognitive impairments - at baseline  Reports some memory loss after tumor removal    SENSATION: WFL  EDEMA:  None noted   POSTURE: rounded shoulders and forward head  PALPATION: No complaint of soreness today  LOWER EXTREMITY ROM:  Active ROM Right eval Left eval  Hip flexion    Hip extension    Hip abduction    Hip adduction    Hip internal rotation    Hip external rotation    Knee flexion    Knee extension    Ankle dorsiflexion    Ankle plantarflexion    Ankle inversion    Ankle eversion     (Blank rows = not tested)  LOWER EXTREMITY MMT:  MMT Right eval Left eval  Hip flexion 4+ 4  Hip extension    Hip  abduction Seated 4 Seated 4-  Hip adduction    Hip internal rotation    Hip external rotation    Knee flexion    Knee extension 5 4+  Ankle dorsiflexion Limited motion; 4+ Limited motion 4+  Ankle plantarflexion    Ankle inversion  Ankle eversion     (Blank rows = not tested)   FUNCTIONAL TESTS:  5 times sit to stand: 23.83 sec using hands to push up to stand 2 minute walk test: 224 ft with gait belt and CGA  SLS unable  Tandem stance right foot back 15; left foot back 3 GAIT: Distance walked: 50 ft in gym Assistive device utilized: gait belt Level of assistance: CGA Comments: slight shuffling gait; left foot tends to abduct                                                                                                                                TREATMENT DATE:  04/13/24: Review of goals and HEP 10 STS, v cues to limit UE usage Standing hip abduction, 10x each LE Tandem Stance, 30, each LE leading SLS, 30 each LE  DGI 1. Gait level surface (2) Mild Impairment: Walks 20', uses assistive devices, slower speed, mild gait deviations. 2. Change in gait speed (2) Mild Impairment: Is able to change speed but demonstrates mild gait deviations, or not gait deviations but unable to achieve a significant change in velocity, or uses an assistive device. 3. Gait with horizontal head turns (2) Mild Impairment: Performs head turns smoothly with slight change in gait velocity, i.e., minor disruption to smooth gait path or uses walking aid. 4. Gait with vertical head turns (2) Mild Impairment: Performs head turns smoothly with slight change in gait velocity, i.e., minor disruption to smooth gait path or uses walking aid. 5. Gait and pivot turn (2) Mild Impairment: Pivot turns safely in > 3 seconds and stops with no loss of balance. 6. Step over obstacle (1) Moderate Impairment: Is able to step over box but must stop, then step over. May require verbal cueing. 7. Step around  obstacles (2) Mild Impairment: Is able to step around both cones, but must slow down and adjust steps to clear cones. 8. Stairs (1) Moderate Impairment: Two feet to a stair, must use rail.  TOTAL SCORE: 14 / 24  Heel raises on incline, 2x10, UE support, in // bars Toe Raises on decline, 2x10, UE support, in // bars   03/30/24 physical therapy evaluation and HEP instruction     PATIENT EDUCATION:  Education details: Patient educated on exam findings, POC, scope of PT, HEP, and what to expect next visit. Person educated: Patient Education method: Explanation, Demonstration, and Handouts Education comprehension: verbalized understanding, returned demonstration, verbal cues required, and tactile cues required  HOME EXERCISE PROGRAM: Access Code: [email or web address] URL: [email or web address] Date: 03/30/2024 Prepared by: [facility] - [facility]hab  Exercises - Sit to Stand with Armchair  - 2 x daily - 7 x weekly - 2 sets - 5 reps - standing single leg balance at the counter (try to not hold on)  - 2 x daily - 7 x weekly - 1 sets - 5 reps - 15 sec hold - tandem stance balance; try not to hold  on  - 2 x daily - 7 x weekly - 1 sets - 5 reps - 15 sec hold - Standing Hip Abduction with Counter Support  - 2 x daily - 7 x weekly - 2 sets - 10 reps  ASSESSMENT:  CLINICAL IMPRESSION: Review of goals and HEP. Patient required some verbal cueing throughout for form. Followed with DGI. Patient demonstrates most difficulty with stepping over obstacles, as she has to come to complete stop prior to stepping and is unsteady during SL stance time requiring quick step, as well as stair navigation, requiring use of BUE on railing, and using step to foot placement for each step ascending and descending. This indicates increased fall risk. Patient will benefit from continued skilled physical therapy in order to address strength, balance, and endurance deficits in order to reduce risk of falls, and improve overall  function/QOL.     Patient is a 78 y.o. female who was seen today for physical therapy evaluation and treatment for M25.561,M25.562 (ICD-10-CM) - Acute pain of both knees W19.XXXD (ICD-10-CM) - Fall, subsequent encounter. Patient demonstrates decreased strength, balance deficits and gait abnormalities which are negatively impacting patient ability to perform ADLs and functional mobility tasks. Patient will benefit from skilled physical therapy services to address these deficits to improve level of function with ADLs, functional mobility tasks, and reduce risk for falls.    OBJECTIVE IMPAIRMENTS: Abnormal gait, decreased activity tolerance, decreased balance, difficulty walking, decreased strength, and impaired perceived functional ability.   ACTIVITY LIMITATIONS: standing, stairs, and locomotion level  PARTICIPATION LIMITATIONS: shopping and community activity  PERSONAL FACTORS: history of seizure, afib are also affecting patient's functional outcome.   REHAB POTENTIAL: Good  CLINICAL DECISION MAKING: Evolving/moderate complexity  EVALUATION COMPLEXITY: Moderate   GOALS: Goals reviewed with patient? Yes  SHORT TERM GOALS: Target date: 04/20/2024 patient with caregiver assist will be independent with initial HEP  Baseline: Goal status: INITIAL  2.  Patient and caregiver will report 30% improvement overall  Baseline:  Goal status: INITIAL   LONG TERM GOALS: Target date: 05/11/2024  Patient will be independent in self management strategies to improve quality of life and functional outcomes Baseline:  Goal status: INITIAL  2.  Patient and caregiver will report 50% improvement overall  Baseline:  Goal status: INITIAL  3.  Patient will able to stand SLS on each leg x 10 to demonstrate improved functional balance  Baseline: unable Goal status: INITIAL  4.  Patient will increase distance on 2 MWT by 50 ft  with SBA to demonstrate improved functional gait in  community  Baseline: 224 ft with CGA and gait belt Goal status: INITIAL  5.   Patient will increase left leg MMT's to 4+ to 5/5 to allow navigation of steps without gait deviation or loss of balance  Baseline: see above Goal status: INITIAL  6.  Patient will improve 5 times sit to stand score to 18 sec or less to demonstrate improved functional mobility and increased leg strength.    Baseline: 23.83 Goal status: INITIAL   PLAN:  PT FREQUENCY: 2x/week  PT DURATION: 6 weeks  PLANNED INTERVENTIONS: 97164- PT Re-evaluation, 97110-Therapeutic exercises, 97530- Therapeutic activity, 97112- Neuromuscular re-education, 97535- Self Care, [id number]- Manual therapy, [phone]- Gait training, [phone]- Orthotic Fit/training, [phone]- Canalith repositioning, [id number]- Aquatic Therapy, [phone]- Splinting, [phone]- Wound care (first 20 sq cm), 97598- Wound care (each additional 20 sq cm)Patient/Family education, Balance training, Stair training, Taping, Dry Needling, Joint mobilization, Joint manipulation, Spinal manipulation, Spinal mobilization, Scar mobilization, and  DME instructions.   PLAN FOR NEXT SESSION: functional strength and balance; left leg strengthening.  Patient currently needs CGA for safety with ambulation, address balance (esp navigating obstacles and SL)   6:35 PM, 04/13/24 Rosaria Settler, PT, DPT [facility] [place]

## 2024-04-18 DIAGNOSIS — Z1211 Encounter for screening for malignant neoplasm of colon: Secondary | ICD-10-CM | POA: Diagnosis not present

## 2024-04-18 LAB — COLOGUARD: Cologuard: NEGATIVE

## 2024-04-20 ENCOUNTER — Ambulatory Visit (INDEPENDENT_AMBULATORY_CARE_PROVIDER_SITE_OTHER): Admitting: Family Medicine

## 2024-04-20 ENCOUNTER — Encounter: Payer: Self-pay | Admitting: Family Medicine

## 2024-04-20 VITALS — BP 116/73 | HR 62 | Resp 16 | Ht <= 58 in | Wt 138.0 lb

## 2024-04-20 DIAGNOSIS — I482 Chronic atrial fibrillation, unspecified: Secondary | ICD-10-CM

## 2024-04-20 DIAGNOSIS — R569 Unspecified convulsions: Secondary | ICD-10-CM

## 2024-04-20 DIAGNOSIS — Z515 Encounter for palliative care: Secondary | ICD-10-CM | POA: Diagnosis not present

## 2024-04-20 DIAGNOSIS — N3941 Urge incontinence: Secondary | ICD-10-CM

## 2024-04-20 DIAGNOSIS — F32A Depression, unspecified: Secondary | ICD-10-CM

## 2024-04-20 DIAGNOSIS — I10 Essential (primary) hypertension: Secondary | ICD-10-CM

## 2024-04-20 DIAGNOSIS — F419 Anxiety disorder, unspecified: Secondary | ICD-10-CM

## 2024-04-20 DIAGNOSIS — I5022 Chronic systolic (congestive) heart failure: Secondary | ICD-10-CM

## 2024-04-20 NOTE — Assessment & Plan Note (Signed)
 Managed by Cardiology and currently stable

## 2024-04-20 NOTE — Assessment & Plan Note (Signed)
 Controlled, no change in medication

## 2024-04-20 NOTE — Progress Notes (Signed)
   Tara [name]     MRN: [id number]      DOB: 1946-01-26  Chief Complaint  Patient presents with   Medical Management of Chronic Issues    12 week follow up     HPI Ms. Ault is here for follow up and re-evaluation of chronic medical conditions, medication management and review of any available recent lab and radiology data.  Preventive health is updated, specifically  Cancer screening and Immunization.   Questions or concerns regarding consultations or procedures which the PT has had in the interim are  addressed. The PT denies any adverse reactions to current medications since the last visit.  Has a MOST form with her which is signed by her daughter and she is accompanied by her husband Lynwood. Tates she has discussed the form with daughter and what is documented is acciurare I reviewed with her the info and essentially states she woulike all interventions including CPR and intubation in the event of CP arrest. She states clarly that these are her wishes Report s improvement in urinary daytime frequency and accidents, recently completed abx course for UTI Bedwetting at night remains a problem will double upon incontinence underwear at bedtime, stops drinking after 7 pm ROS Denies recent fever or chills. Denies sinus pressure, nasal congestion, ear pain or sore throat. Denies chest congestion, productive cough or wheezing. Denies chest pains, palpitations and leg swelling Denies abdominal pain, nausea, vomiting,diarrhea or constipation.   . Chronic  limitation in mobility.however much improved Denies headaches, seizures, numbness, or tingling. Denies  uncontrolled depression, anxiety or insomnia. Denies skin break down or rash.   PE  BP 116/73   Pulse 62   Resp 16   Ht 4' 9 (1.448 m)   Wt 138 lb (62.6 kg)   SpO2 93%   BMI 29.86 kg/m   Patient alert and oriented and in no cardiopulmonary distress.  HEENT: No facial asymmetry, EOMI,     Neck decreased ROM  Chest: Clear to  auscultation bilaterally.  CVS: S1, S2 no murmurs, no S3.Regular rate.  ABD: Soft non tender.   Ext: No edema Decreased  ROM spine, shoulders, hips and knees.  Skin: Intact, no ulcerations or rash noted.  Psych: Good eye contact, normal affect. Memory intact not anxious or depressed appearing.  CNS: CN 2-12 intact, power,  normal throughout.no focal deficits noted.   Assessment & Plan Encounter for end of life care Completed MOST form is reviewed with pt in the presence of her spouse . In her  right mind and fully competent to make decisions, state wants CPR in the event of arrest , as well as intubation  if indicates, I V access  and treatment for any identified illness  Original is returned to pt and copy scanned for the record FULL CODE  Anxiety and depression Controlled, no change in medication   Atrial fibrillation, chronic (HCC) Rate controlled   Chronic HFrEF (heart failure with reduced ejection fraction) (HCC) Managed by Cardiology and currently stable  Seizure (HCC) Controlled, no change in medication   Urinary incontinence Improved Continue current management

## 2024-04-20 NOTE — Assessment & Plan Note (Signed)
 Rate controlled

## 2024-04-20 NOTE — Assessment & Plan Note (Signed)
 Improved  Continue current management

## 2024-04-20 NOTE — Telephone Encounter (Signed)
 Noted, will do

## 2024-04-20 NOTE — Patient Instructions (Addendum)
 Annual exam in November  Pls schedule dexa at checkout   B met and EGFr today and fax to heart failure clinic and let them know please  MOST  form is reviewed at vist, pt and spouse in room Clear documentation that wants all intervention, cPR and intubation if needed, in the ecvent of cardiac arrest I have signed the form and returned original to pt to keep at home  Please double up on bedtime underwear and see if this is adequate as far as bed wettingis concerns  Need covid vaccine and 2nd shingrix vaccine   Good that you are getting around better continue to be careful not to fall  Thanks for choosing [facility], we consider it a privelige to serve you.

## 2024-04-20 NOTE — Assessment & Plan Note (Signed)
 Completed MOST form is reviewed with pt in the presence of her spouse . In her  right mind and fully competent to make decisions, state wants CPR in the event of arrest , as well as intubation  if indicates, I V access  and treatment for any identified illness  Original is returned to pt and copy scanned for the record FULL CODE

## 2024-04-21 ENCOUNTER — Ambulatory Visit: Payer: Self-pay | Admitting: Family Medicine

## 2024-04-21 LAB — BMP8+EGFR
BUN/Creatinine Ratio: 26 (ref 12–28)
BUN: 39 mg/dL — ABNORMAL HIGH (ref 8–27)
CO2: 20 mmol/L (ref 20–29)
Calcium: 9.5 mg/dL (ref 8.7–10.3)
Chloride: 107 mmol/L — ABNORMAL HIGH (ref 96–106)
Creatinine, Ser: 1.48 mg/dL — ABNORMAL HIGH (ref 0.57–1.00)
Glucose: 86 mg/dL (ref 70–99)
Potassium: 5.2 mmol/L (ref 3.5–5.2)
Sodium: 142 mmol/L (ref 134–144)
eGFR: 36 mL/min/1.73 — ABNORMAL LOW (ref >59)

## 2024-04-22 ENCOUNTER — Telehealth (HOSPITAL_COMMUNITY): Payer: Self-pay

## 2024-04-22 NOTE — Telephone Encounter (Signed)
 Received labs from patients primary care--   Creatinine- 1.48 Potassium- 5.2 BUN- 39  Harlene Gainer, NP reviewed labs- recommends stopping potassium supplement   Called placed to patients daughter  who states that patient has not taken any potassium in a long time as she has not needed her furosemide .   Per Harlene, recommends following up in a couple of weeks with pharmacy. Scheduled patient next available with pharmacy.   Patients daughter aware of appointment time and date.

## 2024-04-25 ENCOUNTER — Other Ambulatory Visit (HOSPITAL_COMMUNITY): Payer: Self-pay | Admitting: Cardiology

## 2024-04-25 ENCOUNTER — Other Ambulatory Visit (HOSPITAL_COMMUNITY): Payer: Self-pay | Admitting: Neurosurgery

## 2024-04-25 ENCOUNTER — Other Ambulatory Visit: Payer: Self-pay | Admitting: Family Medicine

## 2024-04-25 DIAGNOSIS — D329 Benign neoplasm of meninges, unspecified: Secondary | ICD-10-CM

## 2024-04-26 ENCOUNTER — Ambulatory Visit (HOSPITAL_COMMUNITY)
Admission: RE | Admit: 2024-04-26 | Discharge: 2024-04-26 | Disposition: A | Discharge status: Home / Self Care | Source: Ambulatory Visit | Attending: Family Medicine | Admitting: Family Medicine

## 2024-04-26 DIAGNOSIS — Z780 Asymptomatic menopausal state: Secondary | ICD-10-CM | POA: Diagnosis not present

## 2024-04-26 DIAGNOSIS — M8589 Other specified disorders of bone density and structure, multiple sites: Secondary | ICD-10-CM | POA: Diagnosis not present

## 2024-04-27 ENCOUNTER — Ambulatory Visit (HOSPITAL_COMMUNITY): Attending: Family Medicine

## 2024-04-27 ENCOUNTER — Ambulatory Visit: Payer: Self-pay | Admitting: Family Medicine

## 2024-04-27 ENCOUNTER — Other Ambulatory Visit (HOSPITAL_COMMUNITY): Payer: Self-pay

## 2024-04-27 ENCOUNTER — Encounter (HOSPITAL_COMMUNITY): Payer: Self-pay

## 2024-04-27 DIAGNOSIS — Z7409 Other reduced mobility: Secondary | ICD-10-CM | POA: Insufficient documentation

## 2024-04-27 DIAGNOSIS — M25562 Pain in left knee: Secondary | ICD-10-CM | POA: Insufficient documentation

## 2024-04-27 DIAGNOSIS — R262 Difficulty in walking, not elsewhere classified: Secondary | ICD-10-CM | POA: Diagnosis not present

## 2024-04-27 DIAGNOSIS — M25561 Pain in right knee: Secondary | ICD-10-CM | POA: Insufficient documentation

## 2024-04-27 DIAGNOSIS — M6281 Muscle weakness (generalized): Secondary | ICD-10-CM | POA: Diagnosis not present

## 2024-04-27 DIAGNOSIS — R2689 Other abnormalities of gait and mobility: Secondary | ICD-10-CM | POA: Insufficient documentation

## 2024-04-27 NOTE — Progress Notes (Signed)
 Advanced Heart Failure Clinic Note   PCP: Dr. Antonetta Cardiology: Dr. Kelsie HF Cardiology: Dr. Rolan  HPI:  Tara [name] is a 78 y.o. AAF with chronic systolic HF, hx of SVT, CKD stage 3 and persistent a fib.   Patient was initially found to have low EF in 2014. Echo 02/2016: EF 35-40%. Cardiolite in 04/2016: fixed apical defect but no evidence for ischemia. She was admitted in 09/2016 with PNA, DVT, and CHF exacerbation.  Echo at that time showed EF down to 20-25%.  She was diuresed and discharged.   Patient was diagnosed with breast cancer in 2008 and had lumpectomy, radiation, and chemotherapy which included epirubicin, and anthracycline.  Recurrent cancer in 2019 with right mastectomy and chemo with cyclophosphamide , MTX, and fluorouracil .   RHC/LHC 06/2017: no significant CAD and mildly elevated PCWP. Cardiac MRI 07/2017: EF 49%, no LGE.   Echo 01/2020: EF 45%, GHK, mildly decreased RV systolic function.  Echo 03/2021: EF 55% with normal RV and IVC.    Patient was in atrial fibrillation with RVR in 02/2022, converted back to NSR with amiodarone .   Echo 03/2022: EF 40-45%, D-shaped septum, moderate RV dysfunction with mild RV enlargement, IVC normal.   05/2023 hospitalized for aspiration PNA  Follow up 06/2023, in atrial fibrillation and mildly volume overloaded. Lasix  increased to 40 daily, Farxiga  started and arranged for repeat echo and DCCV.   Echo 07/2023 EF 30-35%, RV mildly reduced. DCCV 08/06/23 to NSR  Echo (11/2023): EF 40%, normal RV, IVC normal Echo 12/2023: EF 50-55%, LV low normal function, grade I diastolic dysfunction. Normal RV   Admitted 12/2023 post fall on eliquis . Found to be hypotensive and bradycardic on arrival 2/2 polypharmacy. Fall resultant with SDH and L mandible fx. Briefly required dopamine . Echo EF 55%. Neurosurgery ok with Eliquis  at discharge. Discharged with Zio. Amiodarone , Farxiga , and spironolactone  were held at discharge. Started on midodrine .    Zio 01/2024 showed mostly NSR, no worrisome arrhythmias, mild bradycardia with average HR 57 bpm.  Presented to AHF Clinic for follow up 04/13/24. Came with her husband. Overall was feeling fine. She had SOB walking further distances uphill, otherwise no dyspnea working with PT. She no longer requires her walker or WC. Denied palpitations, abnormal bleeding, CP, dizziness, edema, or PND/Orthopnea. Appetite was ok. Weight at home was 138-140 pounds. Reported taking all medications. Currently on abx (ampicillin ) for asymptomatic UTI. Had not needed Lasix . ReDS was 29%.   Today she returns to HF clinic for pharmacist medication titration. At last visit, spironolactone  12.5 mg daily was restarted. BMET a week later with K of 5.2. Originally instructed to stop KCL supplement but it was found that she had not taken any KCL because she had not needed and Lasix . No further changes were made and she was instructed to follow up with pharmacy clinic. Overall she is feeling well today. No dizziness, lightheadedness, CP or palpitations. No SOB/DOE. She goes to the mall in [place] to exercise. BP at home 120/70, no low BP since discontinuing midodrine . BP in clinic today 108/66. Weight at home has been stable. Has not needed and PRN Lasix . No LEE, PND or orhthopnea. Used to eat a lot of bananas but has stopped since being told her potassium was elevated on last lab draw.    HF Medications: Farxiga  10 mg daily Spironolactone  12.5 mg daily Lasix  20 mg PRN  Has the patient been experiencing any side effects to the medications prescribed?  No  Does the  patient have any problems obtaining medications due to transportation or finances?   No, has [employer] Medicare  Understanding of regimen: fair Understanding of indications: fair Potential of compliance: fair Patient understands to avoid NSAIDs. Patient understands to avoid decongestants.    Pertinent Lab Values: 03/02/2024: Serum creatinine  1.32, BUN 23, Potassium 4.1, Sodium 139 04/20/24: Serum creatinine 1.48, BUN 39, Potassium 5.2, Sodium 142  Vital Signs: Weight: 144.4 lbs (last clinic weight: 141 lbs) Blood pressure: 108/66 mmHg  Heart rate: 65 bpm   Assessment/Plan: 1.Chronic systolic CHF: Long-standing cardiomyopathy, since at least 2014.  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  Could be due to epirubicin exposure with 2008 breast cancer treatment.  Cardiac MRI 07/2017: EF up to 49% with no LGE.  Echo 01/2020: EF 45% range.  Echo 03/2021: EF 55%.  Echo 03/2022: EF 40-45%, D-shaped septum, mod RV dysfunction with mild RV enlargement, IVC normal.  Back in atrial fibrillation 06/2023, echo 07/2023: EF 30-35%. Per MD note, reduction in EF potentially due to atrial fibrillation. Once back in NSR, echo 11/2023 with EF about 40%.   - NYHA class II.  Appears euvolemic on exam. - GDMT has been limited by low BP and CKD. Now off midodrine  and tolerating well. - Continue Lasix  20 mg PRN  - Continue spironolactone  12.5 mg daily - Continue Farxiga  10 mg daily  - Potassium level elevated to 5.2 on last lab draw. Will repeat BMET, BNP today for assessment. If remains elevated may have to discontinue spironolactone  vs could consider potassium binder (Lokelma is nonformulary, would use Veltassa which has $0 copay).  - Discussed low K diet today in further detail and provided handout.    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   - Off amiodarone   3. CKD stage 3: Baseline SCr 1.8.   4. Atrial fibrillation: Persistent.  S/p DC-CV 07/2023. Zio 01/2024 showed mostly NSR, no worrisome arrhythmias.  - Now off amiodarone  with persistent bradycardia, required dopamine  during 02/2024 admit.  - Continue Eliquis  5 mg BID.  5. Elevated LFTs: Mild, stable. - Now off amiodarone .  - She has been taking her rosuvastatin  10 mg daily.  Follow up with Dr. Rolan in 2 months.  Tara [name], PharmD, BCPS, BCCP, CPP Heart Failure  Clinic Pharmacist [phone]

## 2024-04-27 NOTE — Therapy (Addendum)
 OUTPATIENT PHYSICAL THERAPY LOWER EXTREMITY TREATMENT   Patient Name: Tara [name] MRN: [id number] DOB:[birth year], 78 y.o., female Today's Date: 04/27/2024  END OF SESSION:  PT End of Session - 04/27/24 1148     Visit Number 3    Number of Visits 12    Date for PT Re-Evaluation 05/11/24    Authorization Type BCBS Medicare    Authorization Time Period no auth required    PT Start Time 1148    PT Stop Time 1226    PT Time Calculation (min) 38 min    Activity Tolerance Patient tolerated treatment well    Behavior During Therapy WFL for tasks assessed/performed           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   Angio-edema    Anxiety    Breast cancer (HCC) 2008   right - s/p lumpectomy->chemo, radiation   Depression    Dysrhythmia    hx SVT   Family history of colon cancer    Family history of prostate cancer    GERD (gastroesophageal reflux disease)    H/O: hysterectomy    Hiatal hernia    History of cancer chemotherapy    History of radiation therapy    Hyperlipidemia    Hypertension 01/25/2018   Nonischemic cardiomyopathy (HCC)    Personal history of chemotherapy    Personal history of radiation therapy 01/05/209   SVT (supraventricular tachycardia) (HCC)    short RP SVT documented 5/14   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HCC)    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)   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   Past Surgical History:  Procedure Laterality Date   ABDOMINAL HYSTERECTOMY  1994   fibroids,    BIOPSY  06/19/2016   Procedure: BIOPSY;  Surgeon: Lamar CHRISTELLA Hollingshead, MD;  Location: [facility];  Service: Endoscopy;;  gastric duodenum   BREAST BIOPSY Right 12/16/2006   malignant   BREAST EXCISIONAL BIOPSY Right 2018   benign lumpectomy   BREAST LUMPECTOMY Right 02/2007   BREAST LUMPECTOMY WITH RADIOACTIVE SEED LOCALIZATION  Right 07/29/2017   Procedure: RIGHT BREAST LUMPECTOMY WITH RADIOACTIVE SEED LOCALIZATION;  Surgeon: Gail Favorite, MD;  Location: [facility] OR;  Service: General;  Laterality: Right;   BREAST SURGERY Right 2008   lumpectomy, cancer   CARDIAC CATHETERIZATION     CARDIOVERSION N/A 08/06/2023   Procedure: CARDIOVERSION (CATH LAB);  Surgeon: Rolan Ezra RAMAN, MD;  Location: [facility] INVASIVE CV LAB;  Service: Cardiovascular;  Laterality: N/A;   CHOLECYSTECTOMY  1999   COLONOSCOPY  2008   Dr. Harvey: multiple polyps. Path not available at time of visit.    COLONOSCOPY WITH PROPOFOL  N/A 04/25/2014   Dr. harvey: Multiple tubular adenomas removed. Diverticulosis. Moderate internal hemorrhoids. Next colonoscopy planned for August 2018.   CRANIOTOMY Bilateral 03/24/2022   Procedure: Bifrontal craniotomy for resection of meningioma;  Surgeon: Gillie Duncans, MD;  Location: [facility] OR;  Service: Neurosurgery;  Laterality: Bilateral;   ESOPHAGOGASTRODUODENOSCOPY (EGD) WITH PROPOFOL  N/A 06/19/2016   Procedure: ESOPHAGOGASTRODUODENOSCOPY (EGD) WITH PROPOFOL ;  Surgeon: Lamar CHRISTELLA Hollingshead, MD;  Location: [facility];  Service: Endoscopy;  Laterality: N/A;   MASTECTOMY Right 2019   MASTECTOMY W/ SENTINEL NODE BIOPSY Right 06/09/2018   Procedure: RIGHT TOTAL MASTECTOMY WITH SENTINEL LYMPH NODE BIOPSY;  Surgeon: Gail Favorite, MD;  Location: [facility]  OR;  Service: General;  Laterality: Right;   POLYPECTOMY N/A 04/25/2014   Procedure: POLYPECTOMY;  Surgeon: Margo LITTIE Haddock, MD;  Location: [facility] ORS;  Service: Endoscopy;  Laterality: N/A;  Ascending and Decending Colon x3 , Transverse colon x2, rectal   PORTACATH PLACEMENT N/A 06/09/2018   Procedure: INSERTION PORT-A-CATH;  Surgeon: Gail Favorite, MD;  Location: [facility] OR;  Service: General;  Laterality: N/A;   RIGHT/LEFT HEART CATH AND CORONARY ANGIOGRAPHY N/A 07/10/2017   Procedure: RIGHT/LEFT HEART CATH AND CORONARY ANGIOGRAPHY;  Surgeon: Rolan Ezra RAMAN, MD;  Location: [facility] INVASIVE CV  LAB;  Service: Cardiovascular;  Laterality: N/A;   Patient Active Problem List   Diagnosis Date Noted   Encounter for end of life care 04/20/2024   Urinary frequency 04/07/2024   Urinary incontinence 04/07/2024   Screening for colon cancer 04/07/2024   Bilateral knee pain 01/22/2024   Fall 01/14/2024   Pain and swelling of left lower leg 08/12/2023   Acute pain of both knees 08/12/2023   Acute foot pain, left 08/12/2023   Elevated liver enzymes 08/02/2023   Encounter for immunization 08/02/2023   Encounter for Medicare annual examination with abnormal findings 08/02/2023   Community acquired pneumonia of left lower lobe of lung 06/19/2023   Dysuria 06/03/2023   Impaired mobility and ADLs 01/05/2023   IGT (impaired glucose tolerance) 01/05/2023   Non-ischemic cardiomyopathy (HCC) 01/05/2023   Constipation 10/26/2022   Paroxysmal atrial fibrillation (HCC) 09/20/2022   Chronic anticoagulation 09/20/2022   Hiatal hernia 09/20/2022   Aspiration pneumonia (HCC) 08/14/2022   Seizure disorder (HCC)    Lethargy 05/15/2022   Dysfunction of right cardiac ventricle 05/07/2022   Acute postoperative anemia due to expected blood loss 05/07/2022   Dysphagia 05/07/2022   Palliative care encounter    Acute respiratory failure with hypoxia (HCC)    Aspiration into airway    Pressure injury of skin 04/07/2022   Meningioma (HCC) 03/24/2022   Seizure (HCC) 03/15/2022   Atrial fibrillation, chronic (HCC) 03/15/2022   Protein-calorie malnutrition, moderate (HCC) 03/15/2022   Atrial fibrillation with RVR (HCC)    Metabolic acidosis 02/27/2022   Anxiety and depression 02/27/2022   Meningioma, cerebral (HCC) 02/27/2022   Normocytic anemia 02/27/2022   Acute renal failure (ARF) (HCC) 02/27/2022   Hemorrhoids 01/04/2021   Ankle deformity, right 09/12/2020   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   Port-A-Cath in place 09/28/2018   Colon adenomas 08/06/2018   Malignant neoplasm of  midline of right female breast (HCC) 06/09/2018   Hypertension 01/25/2018   AKI (acute kidney injury) (HCC) 10/25/2016   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   Acute on chronic combined systolic and diastolic CHF (congestive heart failure) (HCC)    Demand ischemia (HCC)    Hypokalemia 09/29/2016   Prolonged Q-T interval on ECG 09/29/2016   Chronic kidney disease (CKD), stage III (moderate) (HCC) 09/29/2016   Leukocytosis 09/18/2016   Hospital discharge follow-up 07/06/2016   Abnormal CT scan, chest    Upper airway cough syndrome  vs Cough variant asthma  04/09/2016   H/O fall 03/23/2016   Chronic cough 03/17/2016   Seasonal allergies 08/08/2014   Family history of colon cancer 03/30/2014   Osteoporosis 02/14/2014   Metabolic syndrome X 02/05/2014   SVT (supraventricular tachycardia) (HCC) 03/14/2013   Chronic HFrEF (heart failure with reduced ejection fraction) (HCC) 03/14/2013   Vitamin D  deficiency 07/23/2010   Malignant neoplasm of right breast (HCC) 12/13/2007   Hyperlipidemia 12/13/2007   Anxiety state 12/13/2007  Depression, major, single episode, 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   GERD 12/13/2007    PCP: Antonetta Rollene BRAVO MD  REFERRING PROVIDER: Antonetta Rollene BRAVO, MD  REFERRING DIAG: [phone] (ICD-10-CM) - Acute pain of both knees W19.XXXD (ICD-10-CM) - Fall, subsequent encounter  THERAPY DIAG:  Acute pain of both knees  Difficulty in walking, not elsewhere classified  Other abnormalities of gait and mobility  Impaired functional mobility, balance, and endurance  Rationale for Evaluation and Treatment: Rehabilitation  ONSET DATE: back in the spring; April or March  SUBJECTIVE:   SUBJECTIVE STATEMENT: Pt states she has been doing the HEP, but plans to get help with the other exercises as husband is going to start doing them with her. Pt states she has not had any more falls, and knees have not hurt in a long time.   EVAL: Got up and go dizzy  and fell back in spring; arrives with her husband who gives most of patient medical history.  She is wearing a gait belt and husband giving CGA; husband states someone is with her at all times and uses CGA with gait belt when up and walking.    PERTINENT HISTORY: Afib Seizures Tumor removed 2 years ago  History of seizures  PAIN:  Are you having pain? Not today; just stiff in the morning  PRECAUTIONS: Fall  RED FLAGS: None   WEIGHT BEARING RESTRICTIONS: No  FALLS:  Has patient fallen in last 6 months? Yes. Number of falls 1  LIVING ENVIRONMENT: Lives with: lives with their spouse Lives in: House/apartment Stairs: No Has following equipment at home: Vannie - 2 wheeled and Ramped entry  OCCUPATION: retired  PLOF: Independent  PATIENT GOALS: no more falls  NEXT MD VISIT: end of the month  OBJECTIVE:  Note: Objective measures were completed at Evaluation unless otherwise noted.  DIAGNOSTIC FINDINGS:   PATIENT SURVEYS: (husband assists in answers) ABC scale: The Activities-Specific Balance Confidence (ABC) Scale 0% 10 20 30  40 50 60 70 80 90 100% No confidence<->completely confident  "How confident are you that you will not lose your balance or become unsteady when you . . .   40.6%                                                   Total: #/16 40.6%     COGNITION: Overall cognitive status: History of cognitive impairments - at baseline  Reports some memory loss after tumor removal    SENSATION: WFL  EDEMA:  None noted   POSTURE: rounded shoulders and forward head  PALPATION: No complaint of soreness today  LOWER EXTREMITY ROM:  Active ROM Right eval Left eval  Hip flexion    Hip extension    Hip abduction    Hip adduction    Hip internal rotation    Hip external rotation    Knee flexion    Knee extension    Ankle dorsiflexion    Ankle plantarflexion    Ankle inversion    Ankle eversion     (Blank rows = not tested)  LOWER  EXTREMITY MMT:  MMT Right eval Left eval  Hip flexion 4+ 4  Hip extension    Hip abduction Seated 4 Seated 4-  Hip adduction    Hip internal rotation    Hip external rotation    Knee flexion  Knee extension 5 4+  Ankle dorsiflexion Limited motion; 4+ Limited motion 4+  Ankle plantarflexion    Ankle inversion    Ankle eversion     (Blank rows = not tested)   FUNCTIONAL TESTS:  5 times sit to stand: 23.83 sec using hands to push up to stand 2 minute walk test: 224 ft with gait belt and CGA  SLS unable  Tandem stance right foot back 15; left foot back 3 GAIT: Distance walked: 50 ft in gym Assistive device utilized: gait belt Level of assistance: CGA Comments: slight shuffling gait; left foot tends to abduct                                                                                                                                TREATMENT DATE:  04/27/2024  Therapeutic Exercise: -Nustep, 5 minutes, level 3 resistance, pt averages 46 SPM -Speed step ups, 2 sets of 5 reps, 30 second bouts -Lateral step up and overs, 2 sets of 3 reps bilaterally, pt requires BUE support -Lateral stepping with GTB at ankles, 2 laps, CGA required, 15 steps each way -Forward band walks with GTB at ankles, 1 lap, 25 foot, SBA provided    04/13/24: Review of goals and HEP 10 STS, v cues to limit UE usage Standing hip abduction, 10x each LE Tandem Stance, 30, each LE leading SLS, 30 each LE  DGI 1. Gait level surface (2) Mild Impairment: Walks 20', uses assistive devices, slower speed, mild gait deviations. 2. Change in gait speed (2) Mild Impairment: Is able to change speed but demonstrates mild gait deviations, or not gait deviations but unable to achieve a significant change in velocity, or uses an assistive device. 3. Gait with horizontal head turns (2) Mild Impairment: Performs head turns smoothly with slight change in gait velocity, i.e., minor disruption to smooth gait path  or uses walking aid. 4. Gait with vertical head turns (2) Mild Impairment: Performs head turns smoothly with slight change in gait velocity, i.e., minor disruption to smooth gait path or uses walking aid. 5. Gait and pivot turn (2) Mild Impairment: Pivot turns safely in > 3 seconds and stops with no loss of balance. 6. Step over obstacle (1) Moderate Impairment: Is able to step over box but must stop, then step over. May require verbal cueing. 7. Step around obstacles (2) Mild Impairment: Is able to step around both cones, but must slow down and adjust steps to clear cones. 8. Stairs (1) Moderate Impairment: Two feet to a stair, must use rail.  TOTAL SCORE: 14 / 24  Heel raises on incline, 2x10, UE support, in // bars Toe Raises on decline, 2x10, UE support, in // bars   03/30/24 physical therapy evaluation and HEP instruction     PATIENT EDUCATION:  Education details: Patient educated on exam findings, POC, scope of PT, HEP, and what to expect next visit. Person educated: Patient Education method: Explanation, Demonstration,  and Handouts Education comprehension: verbalized understanding, returned demonstration, verbal cues required, and tactile cues required  HOME EXERCISE PROGRAM: Access Code: [email or web address] URL: [email or web address] Date: 03/30/2024 Prepared by: [facility] - Rehab  Exercises - Sit to Stand with Armchair  - 2 x daily - 7 x weekly - 2 sets - 5 reps - standing single leg balance at the counter (try to not hold on)  - 2 x daily - 7 x weekly - 1 sets - 5 reps - 15 sec hold - tandem stance balance; try not to hold on  - 2 x daily - 7 x weekly - 1 sets - 5 reps - 15 sec hold - Standing Hip Abduction with Counter Support  - 2 x daily - 7 x weekly - 2 sets - 10 reps  ASSESSMENT:  CLINICAL IMPRESSION: Patient continues to demonstrate decreased LE strength, decreased gait quality and balance. Patient also demonstrates decreased endurance with aerobic based exercise  during today's session need for multiple rest breaks. Patient able to progress dynamic balance and core activation exercises today with step up variations and banded walks, good performance with verbal cueing. Patient would continue to benefit from skilled physical therapy for increased endurance with ambulation, increased LE strength, and improved balance for improved quality of life, improved independence with gait training and continued progress towards therapy goals.     Patient is a 78 y.o. female who was seen today for physical therapy evaluation and treatment for M25.561,M25.562 (ICD-10-CM) - Acute pain of both knees W19.XXXD (ICD-10-CM) - Fall, subsequent encounter. Patient demonstrates decreased strength, balance deficits and gait abnormalities which are negatively impacting patient ability to perform ADLs and functional mobility tasks. Patient will benefit from skilled physical therapy services to address these deficits to improve level of function with ADLs, functional mobility tasks, and reduce risk for falls.    OBJECTIVE IMPAIRMENTS: Abnormal gait, decreased activity tolerance, decreased balance, difficulty walking, decreased strength, and impaired perceived functional ability.   ACTIVITY LIMITATIONS: standing, stairs, and locomotion level  PARTICIPATION LIMITATIONS: shopping and community activity  PERSONAL FACTORS: history of seizure, afib are also affecting patient's functional outcome.   REHAB POTENTIAL: Good  CLINICAL DECISION MAKING: Evolving/moderate complexity  EVALUATION COMPLEXITY: Moderate   GOALS: Goals reviewed with patient? Yes  SHORT TERM GOALS: Target date: 04/20/2024 patient with caregiver assist will be independent with initial HEP  Baseline: Goal status: INITIAL  2.  Patient and caregiver will report 30% improvement overall  Baseline:  Goal status: INITIAL   LONG TERM GOALS: Target date: 05/11/2024  Patient will be independent in self  management strategies to improve quality of life and functional outcomes Baseline:  Goal status: INITIAL  2.  Patient and caregiver will report 50% improvement overall  Baseline:  Goal status: INITIAL  3.  Patient will able to stand SLS on each leg x 10 to demonstrate improved functional balance  Baseline: unable Goal status: INITIAL  4.  Patient will increase distance on 2 MWT by 50 ft  with SBA to demonstrate improved functional gait in community  Baseline: 224 ft with CGA and gait belt Goal status: INITIAL  5.   Patient will increase left leg MMT's to 4+ to 5/5 to allow navigation of steps without gait deviation or loss of balance  Baseline: see above Goal status: INITIAL  6.  Patient will improve 5 times sit to stand score to 18 sec or less to demonstrate improved functional mobility and increased leg strength.    Baseline: 23.83 Goal status:  INITIAL   PLAN:  PT FREQUENCY: 2x/week  PT DURATION: 6 weeks  PLANNED INTERVENTIONS: 97164- PT Re-evaluation, 97110-Therapeutic exercises, 97530- Therapeutic activity, 97112- Neuromuscular re-education, 97535- Self Care, 02859- Manual therapy, [phone]- Gait training, [phone]- Orthotic Fit/training, [phone]- Canalith repositioning, [id number]- Aquatic Therapy, [phone]- Splinting, [phone]- Wound care (first 20 sq cm), 97598- Wound care (each additional 20 sq cm)Patient/Family education, Balance training, Stair training, Taping, Dry Needling, Joint mobilization, Joint manipulation, Spinal manipulation, Spinal mobilization, Scar mobilization, and DME instructions.   PLAN FOR NEXT SESSION: functional strength and balance; left leg strengthening.  Patient currently needs CGA for safety with ambulation, address balance (esp navigating obstacles and SL)   Alixandria Friedt, PT, DPT [facility] Office: [phone] 6:30 PM, 04/27/24

## 2024-04-29 ENCOUNTER — Telehealth: Payer: Self-pay | Admitting: Family Medicine

## 2024-04-29 NOTE — Telephone Encounter (Signed)
 Patient needs a letter on our letterhead stating not able to do the jury duty.   Noted Copied Sleeved Placed the original official jury summons in provider box Copy placed front desk folder  Call patient at [phone] when letter is ready.

## 2024-04-30 LAB — COLOGUARD: COLOGUARD: NEGATIVE

## 2024-05-02 ENCOUNTER — Ambulatory Visit (HOSPITAL_COMMUNITY)
Admission: RE | Admit: 2024-05-02 | Discharge: 2024-05-02 | Disposition: A | Discharge status: Home / Self Care | Source: Ambulatory Visit | Attending: Neurosurgery | Admitting: Neurosurgery

## 2024-05-02 DIAGNOSIS — D329 Benign neoplasm of meninges, unspecified: Secondary | ICD-10-CM | POA: Insufficient documentation

## 2024-05-02 DIAGNOSIS — G9389 Other specified disorders of brain: Secondary | ICD-10-CM | POA: Diagnosis not present

## 2024-05-02 DIAGNOSIS — D320 Benign neoplasm of cerebral meninges: Secondary | ICD-10-CM | POA: Diagnosis not present

## 2024-05-02 DIAGNOSIS — I6782 Cerebral ischemia: Secondary | ICD-10-CM | POA: Diagnosis not present

## 2024-05-02 MED ORDER — GADOBENATE DIMEGLUMINE 529 MG/ML IV SOLN
10.0000 mL | Freq: Once | INTRAVENOUS | Status: AC | PRN
  Administered 2024-05-02 (×2): 10 mL via INTRAVENOUS

## 2024-05-02 NOTE — Telephone Encounter (Signed)
 Letter printed and placed up front.

## 2024-05-04 ENCOUNTER — Encounter (HOSPITAL_COMMUNITY)

## 2024-05-06 ENCOUNTER — Ambulatory Visit (HOSPITAL_COMMUNITY)

## 2024-05-06 DIAGNOSIS — M25562 Pain in left knee: Secondary | ICD-10-CM | POA: Diagnosis not present

## 2024-05-06 DIAGNOSIS — M25561 Pain in right knee: Secondary | ICD-10-CM | POA: Diagnosis not present

## 2024-05-06 DIAGNOSIS — M6281 Muscle weakness (generalized): Secondary | ICD-10-CM

## 2024-05-06 DIAGNOSIS — Z7409 Other reduced mobility: Secondary | ICD-10-CM

## 2024-05-06 DIAGNOSIS — R262 Difficulty in walking, not elsewhere classified: Secondary | ICD-10-CM | POA: Diagnosis not present

## 2024-05-06 DIAGNOSIS — R2689 Other abnormalities of gait and mobility: Secondary | ICD-10-CM | POA: Diagnosis not present

## 2024-05-06 NOTE — Therapy (Signed)
 OUTPATIENT PHYSICAL THERAPY LOWER EXTREMITY TREATMENT   Patient Name: Tara [name] MRN: [id number] DOB:1946/07/11, 78 y.o., female Today's Date: 05/06/2024  END OF SESSION:  PT End of Session - 05/06/24 1023     Visit Number 4    Number of Visits 12    Date for PT Re-Evaluation 05/11/24    Authorization Type [employer] Medicare    Authorization Time Period no auth required    PT Start Time 1021    PT Stop Time 1100    PT Time Calculation (min) 39 min    Activity Tolerance Patient tolerated treatment well    Behavior During Therapy WFL for tasks assessed/performed           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   Angio-edema    Anxiety    Breast cancer (HCC) 2008   right - s/p lumpectomy->chemo, radiation   Depression    Dysrhythmia    hx SVT   Family history of colon cancer    Family history of prostate cancer    GERD (gastroesophageal reflux disease)    H/O: hysterectomy    Hiatal hernia    History of cancer chemotherapy    History of radiation therapy    Hyperlipidemia    Hypertension 01/25/2018   Nonischemic cardiomyopathy (HCC)    Personal history of chemotherapy    Personal history of radiation therapy 01/05/209   SVT (supraventricular tachycardia) (HCC)    short RP SVT documented 5/14   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HCC)    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)   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   Past Surgical History:  Procedure Laterality Date   ABDOMINAL HYSTERECTOMY  1994   fibroids,    BIOPSY  06/19/2016   Procedure: BIOPSY;  Surgeon: Lamar CHRISTELLA Hollingshead, MD;  Location: [facility];  Service: Endoscopy;;  gastric duodenum   BREAST BIOPSY Right 12/16/2006   malignant   BREAST EXCISIONAL BIOPSY Right 2018   benign lumpectomy   BREAST LUMPECTOMY Right 02/2007   BREAST LUMPECTOMY WITH RADIOACTIVE SEED LOCALIZATION  Right 07/29/2017   Procedure: RIGHT BREAST LUMPECTOMY WITH RADIOACTIVE SEED LOCALIZATION;  Surgeon: Gail Favorite, MD;  Location: [facility] OR;  Service: General;  Laterality: Right;   BREAST SURGERY Right 2008   lumpectomy, cancer   CARDIAC CATHETERIZATION     CARDIOVERSION N/A 08/06/2023   Procedure: CARDIOVERSION (CATH LAB);  Surgeon: Rolan Ezra RAMAN, MD;  Location: [facility] INVASIVE CV LAB;  Service: Cardiovascular;  Laterality: N/A;   CHOLECYSTECTOMY  1999   COLONOSCOPY  2008   Dr. Harvey: multiple polyps. Path not available at time of visit.    COLONOSCOPY WITH PROPOFOL  N/A 04/25/2014   Dr. harvey: Multiple tubular adenomas removed. Diverticulosis. Moderate internal hemorrhoids. Next colonoscopy planned for August 2018.   CRANIOTOMY Bilateral 03/24/2022   Procedure: Bifrontal craniotomy for resection of meningioma;  Surgeon: Gillie Duncans, MD;  Location: [facility] OR;  Service: Neurosurgery;  Laterality: Bilateral;   ESOPHAGOGASTRODUODENOSCOPY (EGD) WITH PROPOFOL  N/A 06/19/2016   Procedure: ESOPHAGOGASTRODUODENOSCOPY (EGD) WITH PROPOFOL ;  Surgeon: Lamar CHRISTELLA Hollingshead, MD;  Location: [facility];  Service: Endoscopy;  Laterality: N/A;   MASTECTOMY Right 2019   MASTECTOMY W/ SENTINEL NODE BIOPSY Right 06/09/2018   Procedure: RIGHT TOTAL MASTECTOMY WITH SENTINEL LYMPH NODE BIOPSY;  Surgeon: Gail Favorite, MD;  Location: [facility]  OR;  Service: General;  Laterality: Right;   POLYPECTOMY N/A 04/25/2014   Procedure: POLYPECTOMY;  Surgeon: Margo LITTIE Haddock, MD;  Location: [facility];  Service: Endoscopy;  Laterality: N/A;  Ascending and Decending Colon x3 , Transverse colon x2, rectal   PORTACATH PLACEMENT N/A 06/09/2018   Procedure: INSERTION PORT-A-CATH;  Surgeon: Gail Favorite, MD;  Location: [facility] OR;  Service: General;  Laterality: N/A;   RIGHT/LEFT HEART CATH AND CORONARY ANGIOGRAPHY N/A 07/10/2017   Procedure: RIGHT/LEFT HEART CATH AND CORONARY ANGIOGRAPHY;  Surgeon: Rolan Ezra RAMAN, MD;  Location: [facility] INVASIVE CV  LAB;  Service: Cardiovascular;  Laterality: N/A;   Patient Active Problem List   Diagnosis Date Noted   Encounter for end of life care 04/20/2024   Urinary frequency 04/07/2024   Urinary incontinence 04/07/2024   Screening for colon cancer 04/07/2024   Bilateral knee pain 01/22/2024   Fall 01/14/2024   Pain and swelling of left lower leg 08/12/2023   Acute pain of both knees 08/12/2023   Acute foot pain, left 08/12/2023   Elevated liver enzymes 08/02/2023   Encounter for immunization 08/02/2023   Encounter for Medicare annual examination with abnormal findings 08/02/2023   Community acquired pneumonia of left lower lobe of lung 06/19/2023   Dysuria 06/03/2023   Impaired mobility and ADLs 01/05/2023   IGT (impaired glucose tolerance) 01/05/2023   Non-ischemic cardiomyopathy (HCC) 01/05/2023   Constipation 10/26/2022   Paroxysmal atrial fibrillation (HCC) 09/20/2022   Chronic anticoagulation 09/20/2022   Hiatal hernia 09/20/2022   Aspiration pneumonia (HCC) 08/14/2022   Seizure disorder (HCC)    Lethargy 05/15/2022   Dysfunction of right cardiac ventricle 05/07/2022   Acute postoperative anemia due to expected blood loss 05/07/2022   Dysphagia 05/07/2022   Palliative care encounter    Acute respiratory failure with hypoxia (HCC)    Aspiration into airway    Pressure injury of skin 04/07/2022   Meningioma (HCC) 03/24/2022   Seizure (HCC) 03/15/2022   Atrial fibrillation, chronic (HCC) 03/15/2022   Protein-calorie malnutrition, moderate (HCC) 03/15/2022   Atrial fibrillation with RVR (HCC)    Metabolic acidosis 02/27/2022   Anxiety and depression 02/27/2022   Meningioma, cerebral (HCC) 02/27/2022   Normocytic anemia 02/27/2022   Acute renal failure (ARF) (HCC) 02/27/2022   Hemorrhoids 01/04/2021   Ankle deformity, right 09/12/2020   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   Port-A-Cath in place 09/28/2018   Colon adenomas 08/06/2018   Malignant neoplasm of  midline of right female breast (HCC) 06/09/2018   Hypertension 01/25/2018   AKI (acute kidney injury) (HCC) 10/25/2016   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   Acute on chronic combined systolic and diastolic CHF (congestive heart failure) (HCC)    Demand ischemia (HCC)    Hypokalemia 09/29/2016   Prolonged Q-T interval on ECG 09/29/2016   Chronic kidney disease (CKD), stage III (moderate) (HCC) 09/29/2016   Leukocytosis 09/18/2016   Hospital discharge follow-up 07/06/2016   Abnormal CT scan, chest    Upper airway cough syndrome  vs Cough variant asthma  04/09/2016   H/O fall 03/23/2016   Chronic cough 03/17/2016   Seasonal allergies 08/08/2014   Family history of colon cancer 03/30/2014   Osteoporosis 02/14/2014   Metabolic syndrome X 02/05/2014   SVT (supraventricular tachycardia) (HCC) 03/14/2013   Chronic HFrEF (heart failure with reduced ejection fraction) (HCC) 03/14/2013   Vitamin D  deficiency 07/23/2010   Malignant neoplasm of right breast (HCC) 12/13/2007   Hyperlipidemia 12/13/2007   Anxiety state 12/13/2007  Depression, major, single episode, 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   GERD 12/13/2007    PCP: Antonetta Rollene BRAVO MD  REFERRING PROVIDER: Antonetta Rollene BRAVO, MD  REFERRING DIAG: [phone] (ICD-10-CM) - Acute pain of both knees W19.XXXD (ICD-10-CM) - Fall, subsequent encounter  THERAPY DIAG:  Acute pain of both knees  Difficulty in walking, not elsewhere classified  Other abnormalities of gait and mobility  Impaired functional mobility, balance, and endurance  Muscle weakness (generalized)  Rationale for Evaluation and Treatment: Rehabilitation  ONSET DATE: back in the spring; April or March  SUBJECTIVE:   SUBJECTIVE STATEMENT: No new falls; knees are not hurting; feels she is getting around at home better and getting close to her prior level.   EVAL: Got up and go dizzy and fell back in spring; arrives with her husband who gives most of  patient medical history.  She is wearing a gait belt and husband giving CGA; husband states someone is with her at all times and uses CGA with gait belt when up and walking.    PERTINENT HISTORY: Afib Seizures Tumor removed 2 years ago  History of seizures  PAIN:  Are you having pain? Not today; just stiff in the morning  PRECAUTIONS: Fall  RED FLAGS: None   WEIGHT BEARING RESTRICTIONS: No  FALLS:  Has patient fallen in last 6 months? Yes. Number of falls 1  LIVING ENVIRONMENT: Lives with: lives with their spouse Lives in: House/apartment Stairs: No Has following equipment at home: Vannie - 2 wheeled and Ramped entry  OCCUPATION: retired  PLOF: Independent  PATIENT GOALS: no more falls  NEXT MD VISIT: end of the month  OBJECTIVE:  Note: Objective measures were completed at Evaluation unless otherwise noted.  DIAGNOSTIC FINDINGS:   PATIENT SURVEYS: (husband assists in answers) ABC scale: The Activities-Specific Balance Confidence (ABC) Scale 0% 10 20 30  40 50 60 70 80 90 100% No confidence<->completely confident  "How confident are you that you will not lose your balance or become unsteady when you . . .   40.6%                                                   Total: #/16 40.6%     COGNITION: Overall cognitive status: History of cognitive impairments - at baseline  Reports some memory loss after tumor removal    SENSATION: WFL  EDEMA:  None noted   POSTURE: rounded shoulders and forward head  PALPATION: No complaint of soreness today  LOWER EXTREMITY ROM:  Active ROM Right eval Left eval  Hip flexion    Hip extension    Hip abduction    Hip adduction    Hip internal rotation    Hip external rotation    Knee flexion    Knee extension    Ankle dorsiflexion    Ankle plantarflexion    Ankle inversion    Ankle eversion     (Blank rows = not tested)  LOWER EXTREMITY MMT:  MMT Right eval Left eval  Hip flexion 4+ 4   Hip extension    Hip abduction Seated 4 Seated 4-  Hip adduction    Hip internal rotation    Hip external rotation    Knee flexion    Knee extension 5 4+  Ankle dorsiflexion Limited motion; 4+ Limited motion 4+  Ankle plantarflexion  Ankle inversion    Ankle eversion     (Blank rows = not tested)   FUNCTIONAL TESTS:  5 times sit to stand: 23.83 sec using hands to push up to stand 2 minute walk test: 224 ft with gait belt and CGA  SLS unable  Tandem stance right foot back 15; left foot back 3 GAIT: Distance walked: 50 ft in gym Assistive device utilized: gait belt Level of assistance: CGA Comments: slight shuffling gait; left foot tends to abduct                                                                                                                                TREATMENT DATE:  05/06/24 Nustep seat 5 x 5' dynamic warm up Heel raises 2 x 10 4 box toe taps 2 x 10 Sit to stand x 10 no UE assist Lateral step ups 4 box x 10 each way Tandem stance x 30 each  04/27/2024  Therapeutic Exercise: -Nustep, 5 minutes, level 3 resistance, pt averages 46 SPM -Speed step ups, 2 sets of 5 reps, 30 second bouts -Lateral step up and overs, 2 sets of 3 reps bilaterally, pt requires BUE support -Lateral stepping with GTB at ankles, 2 laps, CGA required, 15 steps each way -Forward band walks with GTB at ankles, 1 lap, 25 foot, SBA provided    04/13/24: Review of goals and HEP 10 STS, v cues to limit UE usage Standing hip abduction, 10x each LE Tandem Stance, 30, each LE leading SLS, 30 each LE  DGI 1. Gait level surface (2) Mild Impairment: Walks 20', uses assistive devices, slower speed, mild gait deviations. 2. Change in gait speed (2) Mild Impairment: Is able to change speed but demonstrates mild gait deviations, or not gait deviations but unable to achieve a significant change in velocity, or uses an assistive device. 3. Gait with horizontal head turns (2)  Mild Impairment: Performs head turns smoothly with slight change in gait velocity, i.e., minor disruption to smooth gait path or uses walking aid. 4. Gait with vertical head turns (2) Mild Impairment: Performs head turns smoothly with slight change in gait velocity, i.e., minor disruption to smooth gait path or uses walking aid. 5. Gait and pivot turn (2) Mild Impairment: Pivot turns safely in > 3 seconds and stops with no loss of balance. 6. Step over obstacle (1) Moderate Impairment: Is able to step over box but must stop, then step over. May require verbal cueing. 7. Step around obstacles (2) Mild Impairment: Is able to step around both cones, but must slow down and adjust steps to clear cones. 8. Stairs (1) Moderate Impairment: Two feet to a stair, must use rail.  TOTAL SCORE: 14 / 24  Heel raises on incline, 2x10, UE support, in // bars Toe Raises on decline, 2x10, UE support, in // bars   03/30/24 physical therapy evaluation and HEP instruction     PATIENT EDUCATION:  Education details: Patient educated on exam findings, POC, scope of PT, HEP, and what to expect next visit. Person educated: Patient Education method: Explanation, Demonstration, and Handouts Education comprehension: verbalized understanding, returned demonstration, verbal cues required, and tactile cues required  HOME EXERCISE PROGRAM: Access Code: [email or web address] URL: [email or web address] Date: 03/30/2024 Prepared by: [facility]  Exercises - Sit to Stand with Armchair  - 2 x daily - 7 x weekly - 2 sets - 5 reps - standing single leg balance at the counter (try to not hold on)  - 2 x daily - 7 x weekly - 1 sets - 5 reps - 15 sec hold - tandem stance balance; try not to hold on  - 2 x daily - 7 x weekly - 1 sets - 5 reps - 15 sec hold - Standing Hip Abduction with Counter Support  - 2 x daily - 7 x weekly - 2 sets - 10 reps  ASSESSMENT:  CLINICAL IMPRESSION: Today's session with continued focus on  lower extremity strengthening, functional movement and balance.  She overall demonstrates improving gait pattern and functional balance.  Needs occasionall use of Ue's for balance with tandem stance/balance.  . Patient would continue to benefit from skilled physical therapy for increased endurance with ambulation, increased LE strength, and improved balance for improved quality of life, improved independence with gait training and continued progress towards therapy goals.     Patient is a 78 y.o. female who was seen today for physical therapy evaluation and treatment for M25.561,M25.562 (ICD-10-CM) - Acute pain of both knees W19.XXXD (ICD-10-CM) - Fall, subsequent encounter. Patient demonstrates decreased strength, balance deficits and gait abnormalities which are negatively impacting patient ability to perform ADLs and functional mobility tasks. Patient will benefit from skilled physical therapy services to address these deficits to improve level of function with ADLs, functional mobility tasks, and reduce risk for falls.    OBJECTIVE IMPAIRMENTS: Abnormal gait, decreased activity tolerance, decreased balance, difficulty walking, decreased strength, and impaired perceived functional ability.   ACTIVITY LIMITATIONS: standing, stairs, and locomotion level  PARTICIPATION LIMITATIONS: shopping and community activity  PERSONAL FACTORS: history of seizure, afib are also affecting patient's functional outcome.   REHAB POTENTIAL: Good  CLINICAL DECISION MAKING: Evolving/moderate complexity  EVALUATION COMPLEXITY: Moderate   GOALS: Goals reviewed with patient? Yes  SHORT TERM GOALS: Target date: 04/20/2024 patient with caregiver assist will be independent with initial HEP  Baseline: Goal status: INITIAL  2.  Patient and caregiver will report 30% improvement overall  Baseline:  Goal status: INITIAL   LONG TERM GOALS: Target date: 05/11/2024  Patient will be independent in self  management strategies to improve quality of life and functional outcomes Baseline:  Goal status: INITIAL  2.  Patient and caregiver will report 50% improvement overall  Baseline:  Goal status: INITIAL  3.  Patient will able to stand SLS on each leg x 10 to demonstrate improved functional balance  Baseline: unable Goal status: INITIAL  4.  Patient will increase distance on 2 MWT by 50 ft  with SBA to demonstrate improved functional gait in community  Baseline: 224 ft with CGA and gait belt Goal status: INITIAL  5.   Patient will increase left leg MMT's to 4+ to 5/5 to allow navigation of steps without gait deviation or loss of balance  Baseline: see above Goal status: INITIAL  6.  Patient will improve 5 times sit to stand score to 18 sec or less to demonstrate improved functional mobility and increased  leg strength.    Baseline: 23.83 Goal status: INITIAL   PLAN:  PT FREQUENCY: 2x/week  PT DURATION: 6 weeks  PLANNED INTERVENTIONS: 97164- PT Re-evaluation, 97110-Therapeutic exercises, 97530- Therapeutic activity, 97112- Neuromuscular re-education, 97535- Self Care, [id number]- Manual therapy, [phone]- Gait training, [phone]- Orthotic Fit/training, [phone]- Canalith repositioning, [id number]- Aquatic Therapy, [phone]- Splinting, [phone]- Wound care (first 20 sq cm), 97598- Wound care (each additional 20 sq cm)Patient/Family education, Balance training, Stair training, Taping, Dry Needling, Joint mobilization, Joint manipulation, Spinal manipulation, Spinal mobilization, Scar mobilization, and DME instructions.   PLAN FOR NEXT SESSION: functional strength and balance; left leg strengthening.  Patient currently needs CGA for safety with ambulation, address balance (esp navigating obstacles and SL)  11:03 AM, 05/06/24 Kadelyn Dimascio Small Deaken Jurgens MPT [facility] [place] [phone] [phone]

## 2024-05-06 NOTE — Telephone Encounter (Signed)
Patient picked up form and letter.

## 2024-05-09 ENCOUNTER — Encounter (HOSPITAL_COMMUNITY): Payer: Self-pay

## 2024-05-09 ENCOUNTER — Ambulatory Visit (HOSPITAL_COMMUNITY)

## 2024-05-09 DIAGNOSIS — Z7409 Other reduced mobility: Secondary | ICD-10-CM

## 2024-05-09 DIAGNOSIS — R262 Difficulty in walking, not elsewhere classified: Secondary | ICD-10-CM | POA: Diagnosis not present

## 2024-05-09 DIAGNOSIS — D329 Benign neoplasm of meninges, unspecified: Secondary | ICD-10-CM | POA: Diagnosis not present

## 2024-05-09 DIAGNOSIS — R2689 Other abnormalities of gait and mobility: Secondary | ICD-10-CM | POA: Diagnosis not present

## 2024-05-09 DIAGNOSIS — M25562 Pain in left knee: Secondary | ICD-10-CM | POA: Diagnosis not present

## 2024-05-09 DIAGNOSIS — M25561 Pain in right knee: Secondary | ICD-10-CM | POA: Diagnosis not present

## 2024-05-09 DIAGNOSIS — M6281 Muscle weakness (generalized): Secondary | ICD-10-CM | POA: Diagnosis not present

## 2024-05-09 DIAGNOSIS — Z6831 Body mass index (BMI) 31.0-31.9, adult: Secondary | ICD-10-CM | POA: Diagnosis not present

## 2024-05-09 NOTE — Therapy (Signed)
 OUTPATIENT PHYSICAL THERAPY LOWER EXTREMITY TREATMENT   Patient Name: Tara [name] MRN: [id number] DOB:[birth year], 78 y.o., female Today's Date: 05/09/2024  END OF SESSION:  PT End of Session - 05/09/24 1102     Visit Number 5    Number of Visits 12    Date for PT Re-Evaluation 05/11/24    Authorization Type BCBS Medicare    Authorization Time Period no auth required    PT Start Time 1103    PT Stop Time 1143    PT Time Calculation (min) 40 min    Equipment Utilized During Treatment --   SBA, standing exercises complete in //bars for [facility]   Activity Tolerance Patient tolerated treatment well    Behavior During Therapy [facility] for tasks assessed/performed           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   Angio-edema    Anxiety    Breast cancer (HCC) 2008   right - s/p lumpectomy->chemo, radiation   Depression    Dysrhythmia    hx SVT   Family history of colon cancer    Family history of prostate cancer    GERD (gastroesophageal reflux disease)    H/O: hysterectomy    Hiatal hernia    History of cancer chemotherapy    History of radiation therapy    Hyperlipidemia    Hypertension 01/25/2018   Nonischemic cardiomyopathy (HCC)    Personal history of chemotherapy    Personal history of radiation therapy 01/05/209   SVT (supraventricular tachycardia) (HCC)    short RP SVT documented 5/14   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HCC)    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)   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   Past Surgical History:  Procedure Laterality Date   ABDOMINAL HYSTERECTOMY  1994   fibroids,    BIOPSY  06/19/2016   Procedure: BIOPSY;  Surgeon: Lamar CHRISTELLA Hollingshead, MD;  Location: [facility];  Service: Endoscopy;;  gastric duodenum   BREAST BIOPSY Right 12/16/2006   malignant   BREAST EXCISIONAL BIOPSY Right 2018   benign  lumpectomy   BREAST LUMPECTOMY Right 02/2007   BREAST LUMPECTOMY WITH RADIOACTIVE SEED LOCALIZATION Right 07/29/2017   Procedure: RIGHT BREAST LUMPECTOMY WITH RADIOACTIVE SEED LOCALIZATION;  Surgeon: Gail Favorite, MD;  Location: [facility] OR;  Service: General;  Laterality: Right;   BREAST SURGERY Right 2008   lumpectomy, cancer   CARDIAC CATHETERIZATION     CARDIOVERSION N/A 08/06/2023   Procedure: CARDIOVERSION (CATH LAB);  Surgeon: Rolan Ezra RAMAN, MD;  Location: [facility] INVASIVE CV LAB;  Service: Cardiovascular;  Laterality: N/A;   CHOLECYSTECTOMY  1999   COLONOSCOPY  2008   Dr. Harvey: multiple polyps. Path not available at time of visit.    COLONOSCOPY WITH PROPOFOL  N/A 04/25/2014   Dr. harvey: Multiple tubular adenomas removed. Diverticulosis. Moderate internal hemorrhoids. Next colonoscopy planned for August 2018.   CRANIOTOMY Bilateral 03/24/2022   Procedure: Bifrontal craniotomy for resection of meningioma;  Surgeon: Gillie Duncans, MD;  Location: [facility] OR;  Service: Neurosurgery;  Laterality: Bilateral;   ESOPHAGOGASTRODUODENOSCOPY (EGD) WITH PROPOFOL  N/A 06/19/2016   Procedure: ESOPHAGOGASTRODUODENOSCOPY (EGD) WITH PROPOFOL ;  Surgeon: Lamar CHRISTELLA Hollingshead, MD;  Location: [facility];  Service: Endoscopy;  Laterality: N/A;   MASTECTOMY Right 2019   MASTECTOMY W/ SENTINEL NODE BIOPSY Right 06/09/2018  Procedure: RIGHT TOTAL MASTECTOMY WITH SENTINEL LYMPH NODE BIOPSY;  Surgeon: Gail Favorite, MD;  Location: [facility] OR;  Service: General;  Laterality: Right;   POLYPECTOMY N/A 04/25/2014   Procedure: POLYPECTOMY;  Surgeon: Margo LITTIE Haddock, MD;  Location: [facility] ORS;  Service: Endoscopy;  Laterality: N/A;  Ascending and Decending Colon x3 , Transverse colon x2, rectal   PORTACATH PLACEMENT N/A 06/09/2018   Procedure: INSERTION PORT-A-CATH;  Surgeon: Gail Favorite, MD;  Location: [facility] OR;  Service: General;  Laterality: N/A;   RIGHT/LEFT HEART CATH AND CORONARY ANGIOGRAPHY N/A 07/10/2017   Procedure:  RIGHT/LEFT HEART CATH AND CORONARY ANGIOGRAPHY;  Surgeon: Rolan Ezra RAMAN, MD;  Location: [facility] INVASIVE CV LAB;  Service: Cardiovascular;  Laterality: N/A;   Patient Active Problem List   Diagnosis Date Noted   Encounter for end of life care 04/20/2024   Urinary frequency 04/07/2024   Urinary incontinence 04/07/2024   Screening for colon cancer 04/07/2024   Bilateral knee pain 01/22/2024   Fall 01/14/2024   Pain and swelling of left lower leg 08/12/2023   Acute pain of both knees 08/12/2023   Acute foot pain, left 08/12/2023   Elevated liver enzymes 08/02/2023   Encounter for immunization 08/02/2023   Encounter for Medicare annual examination with abnormal findings 08/02/2023   Community acquired pneumonia of left lower lobe of lung 06/19/2023   Dysuria 06/03/2023   Impaired mobility and ADLs 01/05/2023   IGT (impaired glucose tolerance) 01/05/2023   Non-ischemic cardiomyopathy (HCC) 01/05/2023   Constipation 10/26/2022   Paroxysmal atrial fibrillation (HCC) 09/20/2022   Chronic anticoagulation 09/20/2022   Hiatal hernia 09/20/2022   Aspiration pneumonia (HCC) 08/14/2022   Seizure disorder (HCC)    Lethargy 05/15/2022   Dysfunction of right cardiac ventricle 05/07/2022   Acute postoperative anemia due to expected blood loss 05/07/2022   Dysphagia 05/07/2022   Palliative care encounter    Acute respiratory failure with hypoxia (HCC)    Aspiration into airway    Pressure injury of skin 04/07/2022   Meningioma (HCC) 03/24/2022   Seizure (HCC) 03/15/2022   Atrial fibrillation, chronic (HCC) 03/15/2022   Protein-calorie malnutrition, moderate (HCC) 03/15/2022   Atrial fibrillation with RVR (HCC)    Metabolic acidosis 02/27/2022   Anxiety and depression 02/27/2022   Meningioma, cerebral (HCC) 02/27/2022   Normocytic anemia 02/27/2022   Acute renal failure (ARF) (HCC) 02/27/2022   Hemorrhoids 01/04/2021   Ankle deformity, right 09/12/2020   Insomnia related to another mental  disorder 12/23/2018   Port-A-Cath in place 09/28/2018   Colon adenomas 08/06/2018   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 (HCC) 06/09/2018   Hypertension 01/25/2018   AKI (acute kidney injury) (HCC) 10/25/2016   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   Acute on chronic combined systolic and diastolic CHF (congestive heart failure) (HCC)    Demand ischemia (HCC)    Hypokalemia 09/29/2016   Prolonged Q-T interval on ECG 09/29/2016   Chronic kidney disease (CKD), stage III (moderate) (HCC) 09/29/2016   Leukocytosis 09/18/2016   Hospital discharge follow-up 07/06/2016   Abnormal CT scan, chest    Upper airway cough syndrome  vs Cough variant asthma  04/09/2016   H/O fall 03/23/2016   Chronic cough 03/17/2016   Seasonal allergies 08/08/2014   Family history of colon cancer 03/30/2014   Osteoporosis 02/14/2014   Metabolic syndrome X 02/05/2014   SVT (supraventricular tachycardia) (HCC) 03/14/2013   Chronic HFrEF (heart failure with reduced ejection fraction) (HCC) 03/14/2013   Vitamin D  deficiency 07/23/2010   Malignant  neoplasm of right breast (HCC) 12/13/2007   Hyperlipidemia 12/13/2007   Anxiety state 12/13/2007   Depression, major, single episode, 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   GERD 12/13/2007    PCP: Antonetta Rollene BRAVO MD  REFERRING PROVIDER: Antonetta Rollene BRAVO, MD  REFERRING DIAG: [phone] (ICD-10-CM) - Acute pain of both knees W19.XXXD (ICD-10-CM) - Fall, subsequent encounter  THERAPY DIAG:  Acute pain of both knees  Difficulty in walking, not elsewhere classified  Other abnormalities of gait and mobility  Impaired functional mobility, balance, and endurance  Muscle weakness (generalized)  Rationale for Evaluation and Treatment: Rehabilitation  ONSET DATE: back in the spring; April or March  SUBJECTIVE:   SUBJECTIVE STATEMENT: Returns to surgeon later today, feels she is making progress with therapy.  No reports of pain or recent  falls.    EVAL: Got up and go dizzy and fell back in spring; arrives with her husband who gives most of patient medical history.  She is wearing a gait belt and husband giving CGA; husband states someone is with her at all times and uses CGA with gait belt when up and walking.    PERTINENT HISTORY: Afib Seizures Tumor removed 2 years ago  History of seizures  PAIN:  Are you having pain? Not today; just stiff in the morning  PRECAUTIONS: Fall  RED FLAGS: None   WEIGHT BEARING RESTRICTIONS: No  FALLS:  Has patient fallen in last 6 months? Yes. Number of falls 1  LIVING ENVIRONMENT: Lives with: lives with their spouse Lives in: House/apartment Stairs: No Has following equipment at home: Vannie - 2 wheeled and Ramped entry  OCCUPATION: retired  PLOF: Independent  PATIENT GOALS: no more falls  NEXT MD VISIT: end of the month  OBJECTIVE:  Note: Objective measures were completed at Evaluation unless otherwise noted.  DIAGNOSTIC FINDINGS:   PATIENT SURVEYS: (husband assists in answers) ABC scale: The Activities-Specific Balance Confidence (ABC) Scale 0% 10 20 30  40 50 60 70 80 90 100% No confidence<->completely confident  "How confident are you that you will not lose your balance or become unsteady when you . . .   40.6%                                                   Total: #/16 40.6%     COGNITION: Overall cognitive status: History of cognitive impairments - at baseline  Reports some memory loss after tumor removal    SENSATION: WFL  EDEMA:  None noted   POSTURE: rounded shoulders and forward head  PALPATION: No complaint of soreness today  LOWER EXTREMITY ROM:  Active ROM Right eval Left eval  Hip flexion    Hip extension    Hip abduction    Hip adduction    Hip internal rotation    Hip external rotation    Knee flexion    Knee extension    Ankle dorsiflexion    Ankle plantarflexion    Ankle inversion    Ankle eversion      (Blank rows = not tested)  LOWER EXTREMITY MMT:  MMT Right eval Left eval  Hip flexion 4+ 4  Hip extension    Hip abduction Seated 4 Seated 4-  Hip adduction    Hip internal rotation    Hip external rotation    Knee flexion  Knee extension 5 4+  Ankle dorsiflexion Limited motion; 4+ Limited motion 4+  Ankle plantarflexion    Ankle inversion    Ankle eversion     (Blank rows = not tested)   FUNCTIONAL TESTS:  5 times sit to stand: 23.83 sec using hands to push up to stand 2 minute walk test: 224 ft with gait belt and CGA  SLS unable  Tandem stance right foot back 15; left foot back 3 GAIT: Distance walked: 50 ft in gym Assistive device utilized: gait belt Level of assistance: CGA Comments: slight shuffling gait; left foot tends to abduct                                                                                                                                TREATMENT DATE:  05/09/24: Nustep seat 5 [place] x5' dynamic warm up 6in hurdles initially meet feet with Bil UE--> 1UE --> no UE support with step through 5RT 6in hurdles lateral step over 2RT no HHA 6in toe taps alternating 2x 10 6in step up and over 10x each leg with 1 HHA Tandem stance 2x 30 SLS Rt 8, Lt 3  05/06/24 Nustep seat 5 x 5' dynamic warm up Heel raises 2 x 10 4 box toe taps 2 x 10 Sit to stand x 10 no UE assist Lateral step ups 4 box x 10 each way Tandem stance x 30 each  04/27/2024  Therapeutic Exercise: -Nustep, 5 minutes, level 3 resistance, pt averages 46 SPM -Speed step ups, 2 sets of 5 reps, 30 second bouts -Lateral step up and overs, 2 sets of 3 reps bilaterally, pt requires BUE support -Lateral stepping with GTB at ankles, 2 laps, CGA required, 15 steps each way -Forward band walks with GTB at ankles, 1 lap, 25 foot, SBA provided    04/13/24: Review of goals and HEP 10 STS, v cues to limit UE usage Standing hip abduction, 10x each LE Tandem Stance, 30, each LE  leading SLS, 30 each LE  DGI 1. Gait level surface (2) Mild Impairment: Walks 20', uses assistive devices, slower speed, mild gait deviations. 2. Change in gait speed (2) Mild Impairment: Is able to change speed but demonstrates mild gait deviations, or not gait deviations but unable to achieve a significant change in velocity, or uses an assistive device. 3. Gait with horizontal head turns (2) Mild Impairment: Performs head turns smoothly with slight change in gait velocity, i.e., minor disruption to smooth gait path or uses walking aid. 4. Gait with vertical head turns (2) Mild Impairment: Performs head turns smoothly with slight change in gait velocity, i.e., minor disruption to smooth gait path or uses walking aid. 5. Gait and pivot turn (2) Mild Impairment: Pivot turns safely in > 3 seconds and stops with no loss of balance. 6. Step over obstacle (1) Moderate Impairment: Is able to step over box but must stop, then step over. May require verbal cueing.  7. Step around obstacles (2) Mild Impairment: Is able to step around both cones, but must slow down and adjust steps to clear cones. 8. Stairs (1) Moderate Impairment: Two feet to a stair, must use rail.  TOTAL SCORE: 14 / 24  Heel raises on incline, 2x10, UE support, in // bars Toe Raises on decline, 2x10, UE support, in // bars   03/30/24 physical therapy evaluation and HEP instruction     PATIENT EDUCATION:  Education details: Patient educated on exam findings, POC, scope of PT, HEP, and what to expect next visit. Person educated: Patient Education method: Explanation, Demonstration, and Handouts Education comprehension: verbalized understanding, returned demonstration, verbal cues required, and tactile cues required  HOME EXERCISE PROGRAM: Access Code: [email or web address] URL: [email or web address] Date: 03/30/2024 Prepared by: [facility] - Rehab  Exercises - Sit to Stand with Armchair  - 2 x daily - 7 x weekly - 2 sets -  5 reps - standing single leg balance at the counter (try to not hold on)  - 2 x daily - 7 x weekly - 1 sets - 5 reps - 15 sec hold - tandem stance balance; try not to hold on  - 2 x daily - 7 x weekly - 1 sets - 5 reps - 15 sec hold - Standing Hip Abduction with Counter Support  - 2 x daily - 7 x weekly - 2 sets - 10 reps  ASSESSMENT:  CLINICAL IMPRESSION: Session focus with LE strengthening and balance training.  Presents with improved stability with gait with no LOB episodes.  Added hurdles to improve SLS with intermittent UE support.  Pt with good activity tolerance, no need for rest break through session.     Patient is a 78 y.o. female who was seen today for physical therapy evaluation and treatment for M25.561,M25.562 (ICD-10-CM) - Acute pain of both knees W19.XXXD (ICD-10-CM) - Fall, subsequent encounter. Patient demonstrates decreased strength, balance deficits and gait abnormalities which are negatively impacting patient ability to perform ADLs and functional mobility tasks. Patient will benefit from skilled physical therapy services to address these deficits to improve level of function with ADLs, functional mobility tasks, and reduce risk for falls.    OBJECTIVE IMPAIRMENTS: Abnormal gait, decreased activity tolerance, decreased balance, difficulty walking, decreased strength, and impaired perceived functional ability.   ACTIVITY LIMITATIONS: standing, stairs, and locomotion level  PARTICIPATION LIMITATIONS: shopping and community activity  PERSONAL FACTORS: history of seizure, afib are also affecting patient's functional outcome.   REHAB POTENTIAL: Good  CLINICAL DECISION MAKING: Evolving/moderate complexity  EVALUATION COMPLEXITY: Moderate   GOALS: Goals reviewed with patient? Yes  SHORT TERM GOALS: Target date: 04/20/2024 patient with caregiver assist will be independent with initial HEP  Baseline: Goal status: INITIAL  2.  Patient and caregiver will report 30%  improvement overall  Baseline:  Goal status: INITIAL   LONG TERM GOALS: Target date: 05/11/2024  Patient will be independent in self management strategies to improve quality of life and functional outcomes Baseline:  Goal status: INITIAL  2.  Patient and caregiver will report 50% improvement overall  Baseline:  Goal status: INITIAL  3.  Patient will able to stand SLS on each leg x 10 to demonstrate improved functional balance  Baseline: unable Goal status: INITIAL  4.  Patient will increase distance on 2 MWT by 50 ft  with SBA to demonstrate improved functional gait in community  Baseline: 224 ft with CGA and gait belt Goal status: INITIAL  5.   Patient will increase  left leg MMT's to 4+ to 5/5 to allow navigation of steps without gait deviation or loss of balance  Baseline: see above Goal status: INITIAL  6.  Patient will improve 5 times sit to stand score to 18 sec or less to demonstrate improved functional mobility and increased leg strength.    Baseline: 23.83 Goal status: INITIAL   PLAN:  PT FREQUENCY: 2x/week  PT DURATION: 6 weeks  PLANNED INTERVENTIONS: 97164- PT Re-evaluation, 97110-Therapeutic exercises, 97530- Therapeutic activity, 97112- Neuromuscular re-education, 97535- Self Care, 02859- Manual therapy, [phone]- Gait training, [phone]- Orthotic Fit/training, [phone]- Canalith repositioning, [id number]- Aquatic Therapy, [phone]- Splinting, [phone]- Wound care (first 20 sq cm), 97598- Wound care (each additional 20 sq cm)Patient/Family education, Balance training, Stair training, Taping, Dry Needling, Joint mobilization, Joint manipulation, Spinal manipulation, Spinal mobilization, Scar mobilization, and DME instructions.   PLAN FOR NEXT SESSION: functional strength and balance; left leg strengthening.  Patient currently needs CGA for safety with ambulation, address balance (esp navigating obstacles and SL) Augustin Mclean, LPTA/CLT; CBIS [phone]  11:58 AM,  05/09/24

## 2024-05-11 ENCOUNTER — Ambulatory Visit (HOSPITAL_COMMUNITY)
Admission: RE | Admit: 2024-05-11 | Discharge: 2024-05-11 | Disposition: A | Discharge status: Home / Self Care | Source: Ambulatory Visit | Attending: Cardiology | Admitting: Cardiology

## 2024-05-11 ENCOUNTER — Encounter (HOSPITAL_COMMUNITY): Admitting: Physical Therapy

## 2024-05-11 ENCOUNTER — Ambulatory Visit (HOSPITAL_COMMUNITY): Payer: Self-pay | Admitting: Pharmacist

## 2024-05-11 VITALS — BP 108/66 | HR 65 | Wt 144.4 lb

## 2024-05-11 DIAGNOSIS — R7989 Other specified abnormal findings of blood chemistry: Secondary | ICD-10-CM | POA: Diagnosis not present

## 2024-05-11 DIAGNOSIS — N183 Chronic kidney disease, stage 3 unspecified: Secondary | ICD-10-CM | POA: Diagnosis not present

## 2024-05-11 DIAGNOSIS — I428 Other cardiomyopathies: Secondary | ICD-10-CM | POA: Diagnosis not present

## 2024-05-11 DIAGNOSIS — Z7901 Long term (current) use of anticoagulants: Secondary | ICD-10-CM | POA: Diagnosis not present

## 2024-05-11 DIAGNOSIS — I5022 Chronic systolic (congestive) heart failure: Secondary | ICD-10-CM | POA: Diagnosis not present

## 2024-05-11 DIAGNOSIS — I4819 Other persistent atrial fibrillation: Secondary | ICD-10-CM | POA: Insufficient documentation

## 2024-05-11 DIAGNOSIS — Z79899 Other long term (current) drug therapy: Secondary | ICD-10-CM | POA: Diagnosis not present

## 2024-05-11 DIAGNOSIS — Z7984 Long term (current) use of oral hypoglycemic drugs: Secondary | ICD-10-CM | POA: Insufficient documentation

## 2024-05-11 LAB — BRAIN NATRIURETIC PEPTIDE: B Natriuretic Peptide: 39.8 pg/mL (ref 0.0–100.0)

## 2024-05-11 LAB — BASIC METABOLIC PANEL WITH GFR
Anion gap: 9 (ref 5–15)
BUN: 40 mg/dL — ABNORMAL HIGH (ref 8–23)
CO2: 23 mmol/L (ref 22–32)
Calcium: 8.7 mg/dL — ABNORMAL LOW (ref 8.9–10.3)
Chloride: 109 mmol/L (ref 98–111)
Creatinine, Ser: 1.72 mg/dL — ABNORMAL HIGH (ref 0.44–1.00)
GFR, Estimated: 30 mL/min — ABNORMAL LOW (ref >60)
Glucose, Bld: 80 mg/dL (ref 70–99)
Potassium: 4.3 mmol/L (ref 3.5–5.1)
Sodium: 141 mmol/L (ref 135–145)

## 2024-05-11 NOTE — Patient Instructions (Signed)
 It was a pleasure seeing you today!  MEDICATIONS: -No medication changes today. We will wait until your labs return before adjusting any medications.  -Call if you have questions about your medications.  LABS: -We will call you if your labs need attention.  NEXT APPOINTMENT: Return to clinic in 2 months with Dr. Rolan.  In general, to take care of your heart failure: -Limit your fluid intake to 2 Liters (half-gallon) per day.   -Limit your salt intake to ideally 2-3 grams (2000-3000 mg) per day. -Weigh yourself daily and record, and bring that weight diary to your next appointment.  (Weight gain of 2-3 pounds in 1 day typically means fluid weight.) -The medications for your heart are to help your heart and help you live longer.   -Please contact us  before stopping any of your heart medications.  Call the clinic at [phone] with questions or to reschedule future appointments.

## 2024-05-13 ENCOUNTER — Encounter: Payer: Self-pay | Admitting: Radiology

## 2024-05-16 ENCOUNTER — Ambulatory Visit (HOSPITAL_COMMUNITY)

## 2024-05-16 ENCOUNTER — Encounter (HOSPITAL_COMMUNITY): Payer: Self-pay

## 2024-05-16 DIAGNOSIS — Z7409 Other reduced mobility: Secondary | ICD-10-CM | POA: Diagnosis not present

## 2024-05-16 DIAGNOSIS — M25561 Pain in right knee: Secondary | ICD-10-CM | POA: Diagnosis not present

## 2024-05-16 DIAGNOSIS — R262 Difficulty in walking, not elsewhere classified: Secondary | ICD-10-CM

## 2024-05-16 DIAGNOSIS — M25562 Pain in left knee: Secondary | ICD-10-CM | POA: Diagnosis not present

## 2024-05-16 DIAGNOSIS — M6281 Muscle weakness (generalized): Secondary | ICD-10-CM | POA: Diagnosis not present

## 2024-05-16 DIAGNOSIS — R2689 Other abnormalities of gait and mobility: Secondary | ICD-10-CM | POA: Diagnosis not present

## 2024-05-16 NOTE — Addendum Note (Signed)
 Addended by: LUDGER HEART E on: 05/16/2024 06:04 PM   Modules accepted: Orders

## 2024-05-16 NOTE — Therapy (Signed)
 OUTPATIENT PHYSICAL THERAPY LOWER EXTREMITY TREATMENT/PROGRESS NOTE   Patient Name: Tara [name] MRN: [id number] DOB:12-23-1945, 78 y.o., female Today's Date: 05/16/2024   Progress Note Reporting Period 03/30/24 to 05/16/24  See note below for Objective Data and Assessment of Progress/Goals.      END OF SESSION:  PT End of Session - 05/16/24 1149     Visit Number 6    Number of Visits 12    Date for PT Re-Evaluation 06/06/24    Authorization Type [employer] Medicare    Authorization Time Period no auth required    PT Start Time 1150    PT Stop Time 1230    PT Time Calculation (min) 40 min    Activity Tolerance Patient tolerated treatment well    Behavior During Therapy WFL for tasks assessed/performed           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   Angio-edema    Anxiety    Breast cancer (HCC) 2008   right - s/p lumpectomy->chemo, radiation   Depression    Dysrhythmia    hx SVT   Family history of colon cancer    Family history of prostate cancer    GERD (gastroesophageal reflux disease)    H/O: hysterectomy    Hiatal hernia    History of cancer chemotherapy    History of radiation therapy    Hyperlipidemia    Hypertension 01/25/2018   Nonischemic cardiomyopathy (HCC)    Personal history of chemotherapy    Personal history of radiation therapy 01/05/209   SVT (supraventricular tachycardia) (HCC)    short RP SVT documented 5/14   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HCC)    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)   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 OR;  Service: General;  Laterality: Right;   BREAST SURGERY Right 2008   lumpectomy, cancer   CARDIAC CATHETERIZATION     CARDIOVERSION N/A 08/06/2023   Procedure: CARDIOVERSION (CATH LAB);  Surgeon: Rolan Ezra RAMAN, MD;  Location: [facility] INVASIVE CV LAB;  Service: Cardiovascular;  Laterality: N/A;   CHOLECYSTECTOMY  1999   COLONOSCOPY  2008   Dr. Harvey: multiple polyps. Path not available at time of visit.    COLONOSCOPY WITH PROPOFOL  N/A 04/25/2014   Dr. harvey: Multiple tubular adenomas removed. Diverticulosis. Moderate internal hemorrhoids. Next colonoscopy planned for August 2018.   CRANIOTOMY Bilateral 03/24/2022   Procedure: Bifrontal craniotomy for resection of meningioma;  Surgeon: Gillie Duncans, MD;  Location: [facility] OR;  Service: Neurosurgery;  Laterality: Bilateral;   ESOPHAGOGASTRODUODENOSCOPY (EGD) WITH PROPOFOL  N/A 06/19/2016   Procedure: ESOPHAGOGASTRODUODENOSCOPY (EGD) WITH PROPOFOL ;  Surgeon: Lamar CHRISTELLA Hollingshead, MD;  Location: [facility];  Service: Endoscopy;  Laterality: N/A;   MASTECTOMY Right 2019   MASTECTOMY  W/ SENTINEL NODE BIOPSY Right 06/09/2018   Procedure: RIGHT TOTAL MASTECTOMY WITH SENTINEL LYMPH NODE BIOPSY;  Surgeon: Gail Favorite, MD;  Location: [facility] OR;  Service: General;  Laterality: Right;   POLYPECTOMY N/A 04/25/2014   Procedure: POLYPECTOMY;  Surgeon: Margo LITTIE Haddock, MD;  Location: [facility];  Service: Endoscopy;  Laterality: N/A;  Ascending and Decending Colon x3 , Transverse colon x2, rectal   PORTACATH PLACEMENT N/A 06/09/2018   Procedure: INSERTION PORT-A-CATH;  Surgeon: Gail Favorite, MD;  Location: [facility] OR;  Service: General;  Laterality: N/A;   RIGHT/LEFT HEART CATH AND CORONARY  ANGIOGRAPHY N/A 07/10/2017   Procedure: RIGHT/LEFT HEART CATH AND CORONARY ANGIOGRAPHY;  Surgeon: Rolan Ezra RAMAN, MD;  Location: [facility] INVASIVE CV LAB;  Service: Cardiovascular;  Laterality: N/A;   Patient Active Problem List   Diagnosis Date Noted   Encounter for end of life care 04/20/2024   Urinary frequency 04/07/2024   Urinary incontinence 04/07/2024   Screening for colon cancer 04/07/2024   Bilateral knee pain 01/22/2024   Fall 01/14/2024   Pain and swelling of left lower leg 08/12/2023   Acute pain of both knees 08/12/2023   Acute foot pain, left 08/12/2023   Elevated liver enzymes 08/02/2023   Encounter for immunization 08/02/2023   Encounter for Medicare annual examination with abnormal findings 08/02/2023   Community acquired pneumonia of left lower lobe of lung 06/19/2023   Dysuria 06/03/2023   Impaired mobility and ADLs 01/05/2023   IGT (impaired glucose tolerance) 01/05/2023   Non-ischemic cardiomyopathy (HCC) 01/05/2023   Constipation 10/26/2022   Paroxysmal atrial fibrillation (HCC) 09/20/2022   Chronic anticoagulation 09/20/2022   Hiatal hernia 09/20/2022   Aspiration pneumonia (HCC) 08/14/2022   Seizure disorder (HCC)    Lethargy 05/15/2022   Dysfunction of right cardiac ventricle 05/07/2022   Acute postoperative anemia due to expected blood loss 05/07/2022   Dysphagia 05/07/2022   Palliative care encounter    Acute respiratory failure with hypoxia (HCC)    Aspiration into airway    Pressure injury of skin 04/07/2022   Meningioma (HCC) 03/24/2022   Seizure (HCC) 03/15/2022   Atrial fibrillation, chronic (HCC) 03/15/2022   Protein-calorie malnutrition, moderate (HCC) 03/15/2022   Atrial fibrillation with RVR (HCC)    Metabolic acidosis 02/27/2022   Anxiety and depression 02/27/2022   Meningioma, cerebral (HCC) 02/27/2022   Normocytic anemia 02/27/2022   Acute renal failure (ARF) (HCC) 02/27/2022   Hemorrhoids 01/04/2021   Ankle deformity, right 09/12/2020    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   Port-A-Cath in place 09/28/2018   Colon adenomas 08/06/2018   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 (HCC) 06/09/2018   Hypertension 01/25/2018   AKI (acute kidney injury) (HCC) 10/25/2016   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   Acute on chronic combined systolic and diastolic CHF (congestive heart failure) (HCC)    Demand ischemia (HCC)    Hypokalemia 09/29/2016   Prolonged Q-T interval on ECG 09/29/2016   Chronic kidney disease (CKD), stage III (moderate) (HCC) 09/29/2016   Leukocytosis 09/18/2016   Hospital discharge follow-up 07/06/2016   Abnormal CT scan, chest    Upper airway cough syndrome  vs Cough variant asthma  04/09/2016   H/O fall 03/23/2016   Chronic cough 03/17/2016   Seasonal allergies 08/08/2014   Family history of colon cancer 03/30/2014   Osteoporosis 02/14/2014   Metabolic syndrome X 02/05/2014   SVT (supraventricular tachycardia) (HCC) 03/14/2013   Chronic HFrEF (heart failure with reduced ejection fraction) (HCC) 03/14/2013  Vitamin D  deficiency 07/23/2010   Malignant neoplasm of right breast (HCC) 12/13/2007   Hyperlipidemia 12/13/2007   Anxiety state 12/13/2007   Depression, major, single episode, 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   GERD 12/13/2007    PCP: Antonetta Rollene BRAVO MD  REFERRING PROVIDER: Antonetta Rollene BRAVO, MD  REFERRING DIAG: [phone] (ICD-10-CM) - Acute pain of both knees W19.XXXD (ICD-10-CM) - Fall, subsequent encounter  THERAPY DIAG:  Acute pain of both knees  Difficulty in walking, not elsewhere classified  Impaired functional mobility, balance, and endurance  Rationale for Evaluation and Treatment: Rehabilitation  ONSET DATE: back in the spring; April or March  SUBJECTIVE:   SUBJECTIVE STATEMENT: Reports no falls since last visit. Reports neurosurgeon said everything was fine. They took an MRI of head. No concerns this date.     EVAL:  Got up and go dizzy and fell back in spring; arrives with her husband who gives most of patient medical history.  She is wearing a gait belt and husband giving CGA; husband states someone is with her at all times and uses CGA with gait belt when up and walking.    PERTINENT HISTORY: Afib Seizures Tumor removed 2 years ago  History of seizures  PAIN:  Are you having pain? Not today; just stiff in the morning  PRECAUTIONS: Fall  RED FLAGS: None   WEIGHT BEARING RESTRICTIONS: No  FALLS:  Has patient fallen in last 6 months? Yes. Number of falls 1  LIVING ENVIRONMENT: Lives with: lives with their spouse Lives in: House/apartment Stairs: No Has following equipment at home: Vannie - 2 wheeled and Ramped entry  OCCUPATION: retired  PLOF: Independent  PATIENT GOALS: no more falls  NEXT MD VISIT: end of the month  OBJECTIVE:  Note: Objective measures were completed at Evaluation unless otherwise noted.  DIAGNOSTIC FINDINGS:   PATIENT SURVEYS: (husband assists in answers) ABC scale: The Activities-Specific Balance Confidence (ABC) Scale 0% 10 20 30  40 50 60 70 80 90 100% No confidence<->completely confident  "How confident are you that you will not lose your balance or become unsteady when you . . .   40.6%                                                   Total: #/16 40.6%     05/16/24: Total ABC score: 1370 / 1600 = 85.6 %    COGNITION: Overall cognitive status: History of cognitive impairments - at baseline  Reports some memory loss after tumor removal    SENSATION: WFL  EDEMA:  None noted   POSTURE: rounded shoulders and forward head  PALPATION: No complaint of soreness today  LOWER EXTREMITY ROM:  Active ROM Right eval Left eval  Hip flexion    Hip extension    Hip abduction    Hip adduction    Hip internal rotation    Hip external rotation    Knee flexion    Knee extension    Ankle dorsiflexion    Ankle plantarflexion     Ankle inversion    Ankle eversion     (Blank rows = not tested)  LOWER EXTREMITY MMT:  MMT Right eval Left eval R 05/16/24 L 05/16/24  Hip flexion 4+ 4 4+ 4+  Hip extension      Hip abduction Seated 4 Seated 4- 4+ 4+  Hip adduction      Hip internal rotation      Hip external rotation      Knee flexion      Knee extension 5 4+ 5 5  Ankle dorsiflexion Limited motion; 4+ Limited motion 4+ Limited motion 4+ Limited motion 4+  Ankle plantarflexion      Ankle inversion      Ankle eversion       (Blank rows = not tested)   FUNCTIONAL TESTS:  5 times sit to stand: 23.83 sec using hands to push up to stand 2 minute walk test: 224 ft with gait belt and CGA  SLS unable  Tandem stance right foot back 15; left foot back 3   DGI 1. Gait level surface (2) Mild Impairment: Walks 20', uses assistive devices, slower speed, mild gait deviations. 2. Change in gait speed (2) Mild Impairment: Is able to change speed but demonstrates mild gait deviations, or not gait deviations but unable to achieve a significant change in velocity, or uses an assistive device. 3. Gait with horizontal head turns (2) Mild Impairment: Performs head turns smoothly with slight change in gait velocity, i.e., minor disruption to smooth gait path or uses walking aid. 4. Gait with vertical head turns (2) Mild Impairment: Performs head turns smoothly with slight change in gait velocity, i.e., minor disruption to smooth gait path or uses walking aid. 5. Gait and pivot turn (2) Mild Impairment: Pivot turns safely in > 3 seconds and stops with no loss of balance. 6. Step over obstacle (1) Moderate Impairment: Is able to step over box but must stop, then step over. May require verbal cueing. 7. Step around obstacles (2) Mild Impairment: Is able to step around both cones, but must slow down and adjust steps to clear cones. 8. Stairs (1) Moderate Impairment: Two feet to a stair, must use rail.  TOTAL SCORE: 14 /  24   05/16/24: 5 times sit to stand: 17 sec 2 minute walk test: 275 ft, supervision  Tandem balance: R foot back: 30', L foot back: 23 SLS:  L foot- 7, R foot- 6   GAIT: Distance walked: 50 ft in gym Assistive device utilized: gait belt Level of assistance: CGA Comments: slight shuffling gait; left foot tends to abduct                                                                                                                                TREATMENT DATE:  05/16/24: Progress Note:  ABC Scale LE MMT 5 Times Sit to stand 2 minute walk test Tandem Balance SLS Nustep, seat 6, level 3, verbal cues to keep spm over 50   05/09/24: Nustep seat 5 [place] x5' dynamic warm up 6in hurdles initially meet feet with Bil UE--> 1UE --> no UE support with step through 5RT 6in hurdles lateral step over 2RT no HHA 6in toe taps alternating 2x 10 6in step up and over  10x each leg with 1 HHA Tandem stance 2x 30 SLS Rt 8, Lt 3  05/06/24 Nustep seat 5 x 5' dynamic warm up Heel raises 2 x 10 4 box toe taps 2 x 10 Sit to stand x 10 no UE assist Lateral step ups 4 box x 10 each way Tandem stance x 30 each   PATIENT EDUCATION:  Education details: Patient educated on exam findings, POC, scope of PT, HEP, and what to expect next visit. Person educated: Patient Education method: Explanation, Demonstration, and Handouts Education comprehension: verbalized understanding, returned demonstration, verbal cues required, and tactile cues required  HOME EXERCISE PROGRAM: Access Code: [email or web address] URL: [email or web address] Date: 03/30/2024 Prepared by: [facility]  Exercises - Sit to Stand with Armchair  - 2 x daily - 7 x weekly - 2 sets - 5 reps - standing single leg balance at the counter (try to not hold on)  - 2 x daily - 7 x weekly - 1 sets - 5 reps - 15 sec hold - tandem stance balance; try not to hold on  - 2 x daily - 7 x weekly - 1 sets - 5 reps - 15 sec hold -  Standing Hip Abduction with Counter Support  - 2 x daily - 7 x weekly - 2 sets - 10 reps  ASSESSMENT:  CLINICAL IMPRESSION: Progress note performed this date. Patient demonstrates improvements with self perceived balance via ABC Scale, LE strength via MMT, and with all functional testing. Notable difference in tandem and SL balance this date. Patient also demonstrating improved cognition since this therapist saw her last. Appears more aware and alert. Patient has met 6 of all her goals. Two goals revised this date to continue progress. Patient will benefit from continued skilled physical therapy in order to address remaining unmet goals and continue to progress towards independence with ADLs and overall function.    Patient is a 78 y.o. female who was seen today for physical therapy evaluation and treatment for M25.561,M25.562 (ICD-10-CM) - Acute pain of both knees W19.XXXD (ICD-10-CM) - Fall, subsequent encounter. Patient demonstrates decreased strength, balance deficits and gait abnormalities which are negatively impacting patient ability to perform ADLs and functional mobility tasks. Patient will benefit from skilled physical therapy services to address these deficits to improve level of function with ADLs, functional mobility tasks, and reduce risk for falls.    OBJECTIVE IMPAIRMENTS: Abnormal gait, decreased activity tolerance, decreased balance, difficulty walking, decreased strength, and impaired perceived functional ability.   ACTIVITY LIMITATIONS: standing, stairs, and locomotion level  PARTICIPATION LIMITATIONS: shopping and community activity  PERSONAL FACTORS: history of seizure, afib are also affecting patient's functional outcome.   REHAB POTENTIAL: Good  CLINICAL DECISION MAKING: Evolving/moderate complexity  EVALUATION COMPLEXITY: Moderate   GOALS: Goals reviewed with patient? Yes  SHORT TERM GOALS: Target date: 04/20/2024 patient with caregiver assist will be  independent with initial HEP  Baseline: Reports everyday compliance Goal status: MET  2.  Patient and caregiver will report 30% improvement overall  Baseline: Reports 100% improvement, reports husband just lays her clothes out for her at this point on 05/16/24 Goal status: MET   LONG TERM GOALS: Target date: 06/15/2024  Patient will be independent in self management strategies to improve quality of life and functional outcomes Baseline:  Goal status: MET  2.  Patient and caregiver will report 50% improvement overall  Baseline: Reports she's made at least 80% improvement with balance on 05/16/24, 100% improvement with ambulation  Goal status: MET  3.  Patient will able to stand SLS on each leg x 10 to demonstrate improved functional balance  Baseline: see above Goal status: IN PROGRESS  4.  Patient will increase distance on 2 MWT by 100 ft  from initial/baseline with SBA to demonstrate improved functional gait in community  Baseline: 275 ft with supervision on 05/16/24 Goal status: Revised on 05/16/24  5.   Patient will increase left leg MMT's to 4+ to 5/5 to allow navigation of steps without gait deviation or loss of balance  Baseline: see above Goal status: IN PROGRESS  6.  Patient will improve 5 times sit to stand score to 15 sec or less to demonstrate improved functional mobility and increased leg strength.    Baseline: 17 seconds on 05/16/24 Goal status: revised on 05/16/24   PLAN:  PT FREQUENCY: 2x/week  PT DURATION: 6 weeks  PLANNED INTERVENTIONS: 97164- PT Re-evaluation, 97110-Therapeutic exercises, 97530- Therapeutic activity, 97112- Neuromuscular re-education, 97535- Self Care, [id number]- Manual therapy, [phone]- Gait training, [phone]- Orthotic Fit/training, [phone]- Canalith repositioning, [id number]- Aquatic Therapy, [phone]- Splinting, [phone]- Wound care (first 20 sq cm), 97598- Wound care (each additional 20 sq cm)Patient/Family education, Balance training, Stair training,  Taping, Dry Needling, Joint mobilization, Joint manipulation, Spinal manipulation, Spinal mobilization, Scar mobilization, and DME instructions.   PLAN FOR NEXT SESSION: functional strength and balance; left leg strengthening.  Patient currently needs CGA for safety with ambulation, address balance (esp navigating obstacles and SL)   12:34 PM, 05/16/24 Rosaria Settler, PT, DPT [facility] [place]

## 2024-05-18 ENCOUNTER — Ambulatory Visit (HOSPITAL_COMMUNITY)

## 2024-05-18 DIAGNOSIS — M25562 Pain in left knee: Secondary | ICD-10-CM

## 2024-05-18 DIAGNOSIS — M6281 Muscle weakness (generalized): Secondary | ICD-10-CM

## 2024-05-18 DIAGNOSIS — R2689 Other abnormalities of gait and mobility: Secondary | ICD-10-CM

## 2024-05-18 DIAGNOSIS — R262 Difficulty in walking, not elsewhere classified: Secondary | ICD-10-CM

## 2024-05-18 DIAGNOSIS — Z7409 Other reduced mobility: Secondary | ICD-10-CM | POA: Diagnosis not present

## 2024-05-18 DIAGNOSIS — M25561 Pain in right knee: Secondary | ICD-10-CM | POA: Diagnosis not present

## 2024-05-18 NOTE — Therapy (Signed)
 OUTPATIENT PHYSICAL THERAPY LOWER EXTREMITY TREATMENT Patient Name: Tara [name] MRN: [id number] DOB:05-Feb-1946, 78 y.o., female Today's Date: 05/18/2024     END OF SESSION:  PT End of Session - 05/18/24 1152     Visit Number 7    Number of Visits 12    Date for PT Re-Evaluation 06/06/24    Authorization Type BCBS Medicare    Authorization Time Period no auth required    PT Start Time 1152   late arrival   PT Stop Time 1230    PT Time Calculation (min) 38 min    Activity Tolerance Patient tolerated treatment well    Behavior During Therapy [facility] for tasks assessed/performed           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   Angio-edema    Anxiety    Breast cancer (HCC) 2008   right - s/p lumpectomy->chemo, radiation   Depression    Dysrhythmia    hx SVT   Family history of colon cancer    Family history of prostate cancer    GERD (gastroesophageal reflux disease)    H/O: hysterectomy    Hiatal hernia    History of cancer chemotherapy    History of radiation therapy    Hyperlipidemia    Hypertension 01/25/2018   Nonischemic cardiomyopathy (HCC)    Personal history of chemotherapy    Personal history of radiation therapy 01/05/209   SVT (supraventricular tachycardia) (HCC)    short RP SVT documented 5/14   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HCC)    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)   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   Past Surgical History:  Procedure Laterality Date   ABDOMINAL HYSTERECTOMY  1994   fibroids,    BIOPSY  06/19/2016   Procedure: BIOPSY;  Surgeon: Lamar CHRISTELLA Hollingshead, MD;  Location: [facility];  Service: Endoscopy;;  gastric duodenum   BREAST BIOPSY Right 12/16/2006   malignant   BREAST EXCISIONAL BIOPSY Right 2018   benign lumpectomy   BREAST LUMPECTOMY Right 02/2007   BREAST LUMPECTOMY WITH RADIOACTIVE  SEED LOCALIZATION Right 07/29/2017   Procedure: RIGHT BREAST LUMPECTOMY WITH RADIOACTIVE SEED LOCALIZATION;  Surgeon: Gail Favorite, MD;  Location: [facility] OR;  Service: General;  Laterality: Right;   BREAST SURGERY Right 2008   lumpectomy, cancer   CARDIAC CATHETERIZATION     CARDIOVERSION N/A 08/06/2023   Procedure: CARDIOVERSION (CATH LAB);  Surgeon: Rolan Ezra RAMAN, MD;  Location: [facility] INVASIVE CV LAB;  Service: Cardiovascular;  Laterality: N/A;   CHOLECYSTECTOMY  1999   COLONOSCOPY  2008   Dr. Harvey: multiple polyps. Path not available at time of visit.    COLONOSCOPY WITH PROPOFOL  N/A 04/25/2014   Dr. harvey: Multiple tubular adenomas removed. Diverticulosis. Moderate internal hemorrhoids. Next colonoscopy planned for August 2018.   CRANIOTOMY Bilateral 03/24/2022   Procedure: Bifrontal craniotomy for resection of meningioma;  Surgeon: Gillie Duncans, MD;  Location: [facility] OR;  Service: Neurosurgery;  Laterality: Bilateral;   ESOPHAGOGASTRODUODENOSCOPY (EGD) WITH PROPOFOL  N/A 06/19/2016   Procedure: ESOPHAGOGASTRODUODENOSCOPY (EGD) WITH PROPOFOL ;  Surgeon: Lamar CHRISTELLA Hollingshead, MD;  Location: [facility];  Service: Endoscopy;  Laterality: N/A;   MASTECTOMY Right 2019   MASTECTOMY W/ SENTINEL NODE BIOPSY Right 06/09/2018   Procedure: RIGHT TOTAL MASTECTOMY WITH SENTINEL LYMPH NODE BIOPSY;  Surgeon: Gail Favorite,  MD;  Location: [facility] OR;  Service: General;  Laterality: Right;   POLYPECTOMY N/A 04/25/2014   Procedure: POLYPECTOMY;  Surgeon: Margo LITTIE Haddock, MD;  Location: [facility] ORS;  Service: Endoscopy;  Laterality: N/A;  Ascending and Decending Colon x3 , Transverse colon x2, rectal   PORTACATH PLACEMENT N/A 06/09/2018   Procedure: INSERTION PORT-A-CATH;  Surgeon: Gail Favorite, MD;  Location: [facility] OR;  Service: General;  Laterality: N/A;   RIGHT/LEFT HEART CATH AND CORONARY ANGIOGRAPHY N/A 07/10/2017   Procedure: RIGHT/LEFT HEART CATH AND CORONARY ANGIOGRAPHY;  Surgeon: Rolan Ezra RAMAN, MD;  Location:  [facility] INVASIVE CV LAB;  Service: Cardiovascular;  Laterality: N/A;   Patient Active Problem List   Diagnosis Date Noted   Encounter for end of life care 04/20/2024   Urinary frequency 04/07/2024   Urinary incontinence 04/07/2024   Screening for colon cancer 04/07/2024   Bilateral knee pain 01/22/2024   Fall 01/14/2024   Pain and swelling of left lower leg 08/12/2023   Acute pain of both knees 08/12/2023   Acute foot pain, left 08/12/2023   Elevated liver enzymes 08/02/2023   Encounter for immunization 08/02/2023   Encounter for Medicare annual examination with abnormal findings 08/02/2023   Community acquired pneumonia of left lower lobe of lung 06/19/2023   Dysuria 06/03/2023   Impaired mobility and ADLs 01/05/2023   IGT (impaired glucose tolerance) 01/05/2023   Non-ischemic cardiomyopathy (HCC) 01/05/2023   Constipation 10/26/2022   Paroxysmal atrial fibrillation (HCC) 09/20/2022   Chronic anticoagulation 09/20/2022   Hiatal hernia 09/20/2022   Aspiration pneumonia (HCC) 08/14/2022   Seizure disorder (HCC)    Lethargy 05/15/2022   Dysfunction of right cardiac ventricle 05/07/2022   Acute postoperative anemia due to expected blood loss 05/07/2022   Dysphagia 05/07/2022   Palliative care encounter    Acute respiratory failure with hypoxia (HCC)    Aspiration into airway    Pressure injury of skin 04/07/2022   Meningioma (HCC) 03/24/2022   Seizure (HCC) 03/15/2022   Atrial fibrillation, chronic (HCC) 03/15/2022   Protein-calorie malnutrition, moderate (HCC) 03/15/2022   Atrial fibrillation with RVR (HCC)    Metabolic acidosis 02/27/2022   Anxiety and depression 02/27/2022   Meningioma, cerebral (HCC) 02/27/2022   Normocytic anemia 02/27/2022   Acute renal failure (ARF) (HCC) 02/27/2022   Hemorrhoids 01/04/2021   Ankle deformity, right 09/12/2020   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   Port-A-Cath in place 09/28/2018   Colon adenomas 08/06/2018    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 (HCC) 06/09/2018   Hypertension 01/25/2018   AKI (acute kidney injury) (HCC) 10/25/2016   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   Acute on chronic combined systolic and diastolic CHF (congestive heart failure) (HCC)    Demand ischemia (HCC)    Hypokalemia 09/29/2016   Prolonged Q-T interval on ECG 09/29/2016   Chronic kidney disease (CKD), stage III (moderate) (HCC) 09/29/2016   Leukocytosis 09/18/2016   Hospital discharge follow-up 07/06/2016   Abnormal CT scan, chest    Upper airway cough syndrome  vs Cough variant asthma  04/09/2016   H/O fall 03/23/2016   Chronic cough 03/17/2016   Seasonal allergies 08/08/2014   Family history of colon cancer 03/30/2014   Osteoporosis 02/14/2014   Metabolic syndrome X 02/05/2014   SVT (supraventricular tachycardia) (HCC) 03/14/2013   Chronic HFrEF (heart failure with reduced ejection fraction) (HCC) 03/14/2013   Vitamin D  deficiency 07/23/2010   Malignant neoplasm of right breast (HCC) 12/13/2007   Hyperlipidemia 12/13/2007   Anxiety  state 12/13/2007   Depression, major, single episode, 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   GERD 12/13/2007    PCP: Antonetta Rollene BRAVO MD  REFERRING PROVIDER: Antonetta Rollene BRAVO, MD  REFERRING DIAG: [id number] (ICD-10-CM) - Acute pain of both knees W19.XXXD (ICD-10-CM) - Fall, subsequent encounter  THERAPY DIAG:  Acute pain of both knees  Difficulty in walking, not elsewhere classified  Impaired functional mobility, balance, and endurance  Other abnormalities of gait and mobility  Muscle weakness (generalized)  Rationale for Evaluation and Treatment: Rehabilitation  ONSET DATE: back in the spring; April or March  SUBJECTIVE:   SUBJECTIVE STATEMENT: No pain today; no new fall; eating well.  No new complaints today    EVAL: Got up and go dizzy and fell back in spring; arrives with her husband who gives most of patient medical history.   She is wearing a gait belt and husband giving CGA; husband states someone is with her at all times and uses CGA with gait belt when up and walking.    PERTINENT HISTORY: Afib Seizures Tumor removed 2 years ago  History of seizures  PAIN:  Are you having pain? Not today; just stiff in the morning  PRECAUTIONS: Fall  RED FLAGS: None   WEIGHT BEARING RESTRICTIONS: No  FALLS:  Has patient fallen in last 6 months? Yes. Number of falls 1  LIVING ENVIRONMENT: Lives with: lives with their spouse Lives in: House/apartment Stairs: No Has following equipment at home: Vannie - 2 wheeled and Ramped entry  OCCUPATION: retired  PLOF: Independent  PATIENT GOALS: no more falls  NEXT MD VISIT: end of the month  OBJECTIVE:  Note: Objective measures were completed at Evaluation unless otherwise noted.  DIAGNOSTIC FINDINGS:   PATIENT SURVEYS: (husband assists in answers) ABC scale: The Activities-Specific Balance Confidence (ABC) Scale 0% 10 20 30  40 50 60 70 80 90 100% No confidence<->completely confident  "How confident are you that you will not lose your balance or become unsteady when you . . .   40.6%                                                   Total: #/16 40.6%     05/16/24: Total ABC score: 1370 / 1600 = 85.6 %    COGNITION: Overall cognitive status: History of cognitive impairments - at baseline  Reports some memory loss after tumor removal    SENSATION: WFL  EDEMA:  None noted   POSTURE: rounded shoulders and forward head  PALPATION: No complaint of soreness today  LOWER EXTREMITY ROM:  Active ROM Right eval Left eval  Hip flexion    Hip extension    Hip abduction    Hip adduction    Hip internal rotation    Hip external rotation    Knee flexion    Knee extension    Ankle dorsiflexion    Ankle plantarflexion    Ankle inversion    Ankle eversion     (Blank rows = not tested)  LOWER EXTREMITY MMT:  MMT Right eval  Left eval R 05/16/24 L 05/16/24  Hip flexion 4+ 4 4+ 4+  Hip extension      Hip abduction Seated 4 Seated 4- 4+ 4+  Hip adduction      Hip internal rotation      Hip external rotation  Knee flexion      Knee extension 5 4+ 5 5  Ankle dorsiflexion Limited motion; 4+ Limited motion 4+ Limited motion 4+ Limited motion 4+  Ankle plantarflexion      Ankle inversion      Ankle eversion       (Blank rows = not tested)   FUNCTIONAL TESTS:  5 times sit to stand: 23.83 sec using hands to push up to stand 2 minute walk test: 224 ft with gait belt and CGA  SLS unable  Tandem stance right foot back 15; left foot back 3   DGI 1. Gait level surface (2) Mild Impairment: Walks 20', uses assistive devices, slower speed, mild gait deviations. 2. Change in gait speed (2) Mild Impairment: Is able to change speed but demonstrates mild gait deviations, or not gait deviations but unable to achieve a significant change in velocity, or uses an assistive device. 3. Gait with horizontal head turns (2) Mild Impairment: Performs head turns smoothly with slight change in gait velocity, i.e., minor disruption to smooth gait path or uses walking aid. 4. Gait with vertical head turns (2) Mild Impairment: Performs head turns smoothly with slight change in gait velocity, i.e., minor disruption to smooth gait path or uses walking aid. 5. Gait and pivot turn (2) Mild Impairment: Pivot turns safely in > 3 seconds and stops with no loss of balance. 6. Step over obstacle (1) Moderate Impairment: Is able to step over box but must stop, then step over. May require verbal cueing. 7. Step around obstacles (2) Mild Impairment: Is able to step around both cones, but must slow down and adjust steps to clear cones. 8. Stairs (1) Moderate Impairment: Two feet to a stair, must use rail.  TOTAL SCORE: 14 / 24   05/16/24: 5 times sit to stand: 17 sec 2 minute walk test: 275 ft, supervision  Tandem balance: R foot  back: 30', L foot back: 23 SLS:  L foot- 7, R foot- 6   GAIT: Distance walked: 50 ft in gym Assistive device utilized: gait belt Level of assistance: CGA Comments: slight shuffling gait; left foot tends to abduct                                                                                                                                TREATMENT DATE:  05/18/24  Standing  Heel raises 2 x 10 3# hip abduction 2 x 10 3# hip extension 2 x 10 Seated toe raises 2 x 10 3# LAQ's 2 hold 2 x 10 each 4 box toe taps 2 x 10 Tandem stance 2 x 30 each no UE support SLS 3 x 10 each intermittent UE support Nustep seat 5 level 3 x 5' to end treatment   05/16/24: Progress Note:  ABC Scale LE MMT 5 Times Sit to stand 2 minute walk test Tandem Balance SLS Nustep, seat 6, level 3, verbal cues to keep spm over  50   05/09/24: Nustep seat 5 [place] x5' dynamic warm up 6in hurdles initially meet feet with Bil UE--> 1UE --> no UE support with step through 5RT 6in hurdles lateral step over 2RT no HHA 6in toe taps alternating 2x 10 6in step up and over 10x each leg with 1 HHA Tandem stance 2x 30 SLS Rt 8, Lt 3  05/06/24 Nustep seat 5 x 5' dynamic warm up Heel raises 2 x 10 4 box toe taps 2 x 10 Sit to stand x 10 no UE assist Lateral step ups 4 box x 10 each way Tandem stance x 30 each   PATIENT EDUCATION:  Education details: Patient educated on exam findings, POC, scope of PT, HEP, and what to expect next visit. Person educated: Patient Education method: Explanation, Demonstration, and Handouts Education comprehension: verbalized understanding, returned demonstration, verbal cues required, and tactile cues required  HOME EXERCISE PROGRAM: Access Code: [email or web address] URL: [email or web address] Date: 03/30/2024 Prepared by: [facility] - [facility]  Exercises - Sit to Stand with Armchair  - 2 x daily - 7 x weekly - 2 sets - 5 reps - standing single leg balance at the  counter (try to not hold on)  - 2 x daily - 7 x weekly - 1 sets - 5 reps - 15 sec hold - tandem stance balance; try not to hold on  - 2 x daily - 7 x weekly - 1 sets - 5 reps - 15 sec hold - Standing Hip Abduction with Counter Support  - 2 x daily - 7 x weekly - 2 sets - 10 reps  ASSESSMENT:  CLINICAL IMPRESSION: Late arrival.   Today's session with continued focus on lower extremity strengthening and balance.  Noted limited dorsiflexion bilaterally; able to improve that range in sitting. Noted improved balance with right foot back in tandem stance compared to when the left is in back.most challenge with SLS on left leg.    Patient will benefit from continued skilled therapy services to address deficits and promote return to optimal function.       Patient is a 78 y.o. female who was seen today for physical therapy evaluation and treatment for M25.561,M25.562 (ICD-10-CM) - Acute pain of both knees W19.XXXD (ICD-10-CM) - Fall, subsequent encounter. Patient demonstrates decreased strength, balance deficits and gait abnormalities which are negatively impacting patient ability to perform ADLs and functional mobility tasks. Patient will benefit from skilled physical therapy services to address these deficits to improve level of function with ADLs, functional mobility tasks, and reduce risk for falls.    OBJECTIVE IMPAIRMENTS: Abnormal gait, decreased activity tolerance, decreased balance, difficulty walking, decreased strength, and impaired perceived functional ability.   ACTIVITY LIMITATIONS: standing, stairs, and locomotion level  PARTICIPATION LIMITATIONS: shopping and community activity  PERSONAL FACTORS: history of seizure, afib are also affecting patient's functional outcome.   REHAB POTENTIAL: Good  CLINICAL DECISION MAKING: Evolving/moderate complexity  EVALUATION COMPLEXITY: Moderate   GOALS: Goals reviewed with patient? Yes  SHORT TERM GOALS: Target date: 04/20/2024 patient with  caregiver assist will be independent with initial HEP  Baseline: Reports everyday compliance Goal status: MET  2.  Patient and caregiver will report 30% improvement overall  Baseline: Reports 100% improvement, reports husband just lays her clothes out for her at this point on 05/16/24 Goal status: MET   LONG TERM GOALS: Target date: 06/15/2024  Patient will be independent in self management strategies to improve quality of life and functional outcomes Baseline:  Goal status: MET  2.  Patient and caregiver will report 50% improvement overall  Baseline: Reports she's made at least 80% improvement with balance on 05/16/24, 100% improvement with ambulation  Goal status: MET  3.  Patient will able to stand SLS on each leg x 10 to demonstrate improved functional balance  Baseline: see above Goal status: IN PROGRESS  4.  Patient will increase distance on 2 MWT by 100 ft  from initial/baseline with SBA to demonstrate improved functional gait in community  Baseline: 275 ft with supervision on 05/16/24 Goal status: Revised on 05/16/24  5.   Patient will increase left leg MMT's to 4+ to 5/5 to allow navigation of steps without gait deviation or loss of balance  Baseline: see above Goal status: IN PROGRESS  6.  Patient will improve 5 times sit to stand score to 15 sec or less to demonstrate improved functional mobility and increased leg strength.    Baseline: 17 seconds on 05/16/24 Goal status: revised on 05/16/24   PLAN:  PT FREQUENCY: 2x/week  PT DURATION: 6 weeks  PLANNED INTERVENTIONS: 97164- PT Re-evaluation, 97110-Therapeutic exercises, 97530- Therapeutic activity, 97112- Neuromuscular re-education, 97535- Self Care, [id number]- Manual therapy, [phone]- Gait training, [phone]- Orthotic Fit/training, [phone]- Canalith repositioning, [id number]- Aquatic Therapy, [phone]- Splinting, [phone]- Wound care (first 20 sq cm), 97598- Wound care (each additional 20 sq cm)Patient/Family education, Balance  training, Stair training, Taping, Dry Needling, Joint mobilization, Joint manipulation, Spinal manipulation, Spinal mobilization, Scar mobilization, and DME instructions.   PLAN FOR NEXT SESSION: functional strength and balance; left leg strengthening.  address balance (esp navigating obstacles and SL)   12:29 PM, 05/18/24 Rosaria Settler, PT, DPT [facility] [place]

## 2024-05-25 ENCOUNTER — Ambulatory Visit (HOSPITAL_COMMUNITY): Attending: Family Medicine

## 2024-05-25 ENCOUNTER — Other Ambulatory Visit: Payer: Self-pay | Admitting: Family Medicine

## 2024-05-25 DIAGNOSIS — M25562 Pain in left knee: Secondary | ICD-10-CM | POA: Diagnosis not present

## 2024-05-25 DIAGNOSIS — R2689 Other abnormalities of gait and mobility: Secondary | ICD-10-CM | POA: Insufficient documentation

## 2024-05-25 DIAGNOSIS — R262 Difficulty in walking, not elsewhere classified: Secondary | ICD-10-CM | POA: Diagnosis not present

## 2024-05-25 DIAGNOSIS — Z7409 Other reduced mobility: Secondary | ICD-10-CM | POA: Diagnosis not present

## 2024-05-25 DIAGNOSIS — M6281 Muscle weakness (generalized): Secondary | ICD-10-CM | POA: Diagnosis not present

## 2024-05-25 DIAGNOSIS — M25561 Pain in right knee: Secondary | ICD-10-CM | POA: Diagnosis not present

## 2024-05-25 NOTE — Therapy (Signed)
 OUTPATIENT PHYSICAL THERAPY LOWER EXTREMITY TREATMENT Patient Name: Tara [name] MRN: [id number] DOB:April 16, 1946, 78 y.o., female Today's Date: 05/25/2024     END OF SESSION:  PT End of Session - 05/25/24 1149     Visit Number 8    Number of Visits 12    Date for PT Re-Evaluation 06/06/24    Authorization Type BCBS Medicare    Authorization Time Period no auth required    PT Start Time 1148    PT Stop Time 1228    PT Time Calculation (min) 40 min    Activity Tolerance Patient tolerated treatment well    Behavior During Therapy [facility] for tasks assessed/performed           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   Angio-edema    Anxiety    Breast cancer (HCC) 2008   right - s/p lumpectomy->chemo, radiation   Depression    Dysrhythmia    hx SVT   Family history of colon cancer    Family history of prostate cancer    GERD (gastroesophageal reflux disease)    H/O: hysterectomy    Hiatal hernia    History of cancer chemotherapy    History of radiation therapy    Hyperlipidemia    Hypertension 01/25/2018   Nonischemic cardiomyopathy (HCC)    Personal history of chemotherapy    Personal history of radiation therapy 01/05/209   SVT (supraventricular tachycardia) (HCC)    short RP SVT documented 5/14   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HCC)    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)   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   Past Surgical History:  Procedure Laterality Date   ABDOMINAL HYSTERECTOMY  1994   fibroids,    BIOPSY  06/19/2016   Procedure: BIOPSY;  Surgeon: Lamar CHRISTELLA Hollingshead, MD;  Location: [facility];  Service: Endoscopy;;  gastric duodenum   BREAST BIOPSY Right 12/16/2006   malignant   BREAST EXCISIONAL BIOPSY Right 2018   benign lumpectomy   BREAST LUMPECTOMY Right 02/2007   BREAST LUMPECTOMY WITH RADIOACTIVE SEED  LOCALIZATION Right 07/29/2017   Procedure: RIGHT BREAST LUMPECTOMY WITH RADIOACTIVE SEED LOCALIZATION;  Surgeon: Gail Favorite, MD;  Location: [facility] OR;  Service: General;  Laterality: Right;   BREAST SURGERY Right 2008   lumpectomy, cancer   CARDIAC CATHETERIZATION     CARDIOVERSION N/A 08/06/2023   Procedure: CARDIOVERSION (CATH LAB);  Surgeon: Rolan Ezra RAMAN, MD;  Location: [facility] INVASIVE CV LAB;  Service: Cardiovascular;  Laterality: N/A;   CHOLECYSTECTOMY  1999   COLONOSCOPY  2008   Dr. Harvey: multiple polyps. Path not available at time of visit.    COLONOSCOPY WITH PROPOFOL  N/A 04/25/2014   Dr. harvey: Multiple tubular adenomas removed. Diverticulosis. Moderate internal hemorrhoids. Next colonoscopy planned for August 2018.   CRANIOTOMY Bilateral 03/24/2022   Procedure: Bifrontal craniotomy for resection of meningioma;  Surgeon: Gillie Duncans, MD;  Location: [facility] OR;  Service: Neurosurgery;  Laterality: Bilateral;   ESOPHAGOGASTRODUODENOSCOPY (EGD) WITH PROPOFOL  N/A 06/19/2016   Procedure: ESOPHAGOGASTRODUODENOSCOPY (EGD) WITH PROPOFOL ;  Surgeon: Lamar CHRISTELLA Hollingshead, MD;  [facility];  Service: Endoscopy;  Laterality: N/A;   MASTECTOMY Right 2019   MASTECTOMY W/ SENTINEL NODE BIOPSY Right 06/09/2018   Procedure: RIGHT TOTAL MASTECTOMY WITH SENTINEL LYMPH NODE BIOPSY;  Surgeon: Gail Favorite, MD;  Location:  [facility] OR;  Service: General;  Laterality: Right;   POLYPECTOMY N/A 04/25/2014   Procedure: POLYPECTOMY;  Surgeon: Margo LITTIE Haddock, MD;  Location: [facility] ORS;  Service: Endoscopy;  Laterality: N/A;  Ascending and Decending Colon x3 , Transverse colon x2, rectal   PORTACATH PLACEMENT N/A 06/09/2018   Procedure: INSERTION PORT-A-CATH;  Surgeon: Gail Favorite, MD;  Location: [facility] OR;  Service: General;  Laterality: N/A;   RIGHT/LEFT HEART CATH AND CORONARY ANGIOGRAPHY N/A 07/10/2017   Procedure: RIGHT/LEFT HEART CATH AND CORONARY ANGIOGRAPHY;  Surgeon: Rolan Ezra RAMAN, MD;  Location: [facility]  INVASIVE CV LAB;  Service: Cardiovascular;  Laterality: N/A;   Patient Active Problem List   Diagnosis Date Noted   Encounter for end of life care 04/20/2024   Urinary frequency 04/07/2024   Urinary incontinence 04/07/2024   Screening for colon cancer 04/07/2024   Bilateral knee pain 01/22/2024   Fall 01/14/2024   Pain and swelling of left lower leg 08/12/2023   Acute pain of both knees 08/12/2023   Acute foot pain, left 08/12/2023   Elevated liver enzymes 08/02/2023   Encounter for immunization 08/02/2023   Encounter for Medicare annual examination with abnormal findings 08/02/2023   Community acquired pneumonia of left lower lobe of lung 06/19/2023   Dysuria 06/03/2023   Impaired mobility and ADLs 01/05/2023   IGT (impaired glucose tolerance) 01/05/2023   Non-ischemic cardiomyopathy (HCC) 01/05/2023   Constipation 10/26/2022   Paroxysmal atrial fibrillation (HCC) 09/20/2022   Chronic anticoagulation 09/20/2022   Hiatal hernia 09/20/2022   Aspiration pneumonia (HCC) 08/14/2022   Seizure disorder (HCC)    Lethargy 05/15/2022   Dysfunction of right cardiac ventricle 05/07/2022   Acute postoperative anemia due to expected blood loss 05/07/2022   Dysphagia 05/07/2022   Palliative care encounter    Acute respiratory failure with hypoxia (HCC)    Aspiration into airway    Pressure injury of skin 04/07/2022   Meningioma (HCC) 03/24/2022   Seizure (HCC) 03/15/2022   Atrial fibrillation, chronic (HCC) 03/15/2022   Protein-calorie malnutrition, moderate (HCC) 03/15/2022   Atrial fibrillation with RVR (HCC)    Metabolic acidosis 02/27/2022   Anxiety and depression 02/27/2022   Meningioma, cerebral (HCC) 02/27/2022   Normocytic anemia 02/27/2022   Acute renal failure (ARF) (HCC) 02/27/2022   Hemorrhoids 01/04/2021   Ankle deformity, right 09/12/2020   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   Port-A-Cath in place 09/28/2018   Colon adenomas 08/06/2018   Malignant  neoplasm of midline of right female breast (HCC) 06/09/2018   Hypertension 01/25/2018   AKI (acute kidney injury) (HCC) 10/25/2016   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   Acute on chronic combined systolic and diastolic CHF (congestive heart failure) (HCC)    Demand ischemia (HCC)    Hypokalemia 09/29/2016   Prolonged Q-T interval on ECG 09/29/2016   Chronic kidney disease (CKD), stage III (moderate) (HCC) 09/29/2016   Leukocytosis 09/18/2016   Hospital discharge follow-up 07/06/2016   Abnormal CT scan, chest    Upper airway cough syndrome  vs Cough variant asthma  04/09/2016   H/O fall 03/23/2016   Chronic cough 03/17/2016   Seasonal allergies 08/08/2014   Family history of colon cancer 03/30/2014   Osteoporosis 02/14/2014   Metabolic syndrome X 02/05/2014   SVT (supraventricular tachycardia) (HCC) 03/14/2013   Chronic HFrEF (heart failure with reduced ejection fraction) (HCC) 03/14/2013   Vitamin D  deficiency 07/23/2010   Malignant neoplasm of right breast (HCC) 12/13/2007   Hyperlipidemia 12/13/2007   Anxiety state 12/13/2007  Depression, major, single episode, 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   GERD 12/13/2007    PCP: Antonetta Rollene BRAVO MD  REFERRING PROVIDER: Antonetta Rollene BRAVO, MD  REFERRING DIAG: [phone] (ICD-10-CM) - Acute pain of both knees W19.XXXD (ICD-10-CM) - Fall, subsequent encounter  THERAPY DIAG:  Acute pain of both knees  Difficulty in walking, not elsewhere classified  Impaired functional mobility, balance, and endurance  Other abnormalities of gait and mobility  Muscle weakness (generalized)  Rationale for Evaluation and Treatment: Rehabilitation  ONSET DATE: back in the spring; April or March  SUBJECTIVE:   SUBJECTIVE STATEMENT: No pain today; no new fall; sometimes she is doing some cooking.     EVAL: Got up and go dizzy and fell back in spring; arrives with her husband who gives most of patient medical history.  She is  wearing a gait belt and husband giving CGA; husband states someone is with her at all times and uses CGA with gait belt when up and walking.    PERTINENT HISTORY: Afib Seizures Tumor removed 2 years ago  History of seizures  PAIN:  Are you having pain? Not today; just stiff in the morning  PRECAUTIONS: Fall  RED FLAGS: None   WEIGHT BEARING RESTRICTIONS: No  FALLS:  Has patient fallen in last 6 months? Yes. Number of falls 1  LIVING ENVIRONMENT: Lives with: lives with their spouse Lives in: House/apartment Stairs: No Has following equipment at home: Vannie - 2 wheeled and Ramped entry  OCCUPATION: retired  PLOF: Independent  PATIENT GOALS: no more falls  NEXT MD VISIT: end of the month  OBJECTIVE:  Note: Objective measures were completed at Evaluation unless otherwise noted.  DIAGNOSTIC FINDINGS:   PATIENT SURVEYS: (husband assists in answers) ABC scale: The Activities-Specific Balance Confidence (ABC) Scale 0% 10 20 30  40 50 60 70 80 90 100% No confidence<->completely confident  "How confident are you that you will not lose your balance or become unsteady when you . . .   40.6%                                                   Total: #/16 40.6%     05/16/24: Total ABC score: 1370 / 1600 = 85.6 %    COGNITION: Overall cognitive status: History of cognitive impairments - at baseline  Reports some memory loss after tumor removal    SENSATION: WFL  EDEMA:  None noted   POSTURE: rounded shoulders and forward head  PALPATION: No complaint of soreness today  LOWER EXTREMITY ROM:  Active ROM Right eval Left eval  Hip flexion    Hip extension    Hip abduction    Hip adduction    Hip internal rotation    Hip external rotation    Knee flexion    Knee extension    Ankle dorsiflexion    Ankle plantarflexion    Ankle inversion    Ankle eversion     (Blank rows = not tested)  LOWER EXTREMITY MMT:  MMT Right eval Left eval  R 05/16/24 L 05/16/24  Hip flexion 4+ 4 4+ 4+  Hip extension      Hip abduction Seated 4 Seated 4- 4+ 4+  Hip adduction      Hip internal rotation      Hip external rotation  Knee flexion      Knee extension 5 4+ 5 5  Ankle dorsiflexion Limited motion; 4+ Limited motion 4+ Limited motion 4+ Limited motion 4+  Ankle plantarflexion      Ankle inversion      Ankle eversion       (Blank rows = not tested)   FUNCTIONAL TESTS:  5 times sit to stand: 23.83 sec using hands to push up to stand 2 minute walk test: 224 ft with gait belt and CGA  SLS unable  Tandem stance right foot back 15; left foot back 3   DGI 1. Gait level surface (2) Mild Impairment: Walks 20', uses assistive devices, slower speed, mild gait deviations. 2. Change in gait speed (2) Mild Impairment: Is able to change speed but demonstrates mild gait deviations, or not gait deviations but unable to achieve a significant change in velocity, or uses an assistive device. 3. Gait with horizontal head turns (2) Mild Impairment: Performs head turns smoothly with slight change in gait velocity, i.e., minor disruption to smooth gait path or uses walking aid. 4. Gait with vertical head turns (2) Mild Impairment: Performs head turns smoothly with slight change in gait velocity, i.e., minor disruption to smooth gait path or uses walking aid. 5. Gait and pivot turn (2) Mild Impairment: Pivot turns safely in > 3 seconds and stops with no loss of balance. 6. Step over obstacle (1) Moderate Impairment: Is able to step over box but must stop, then step over. May require verbal cueing. 7. Step around obstacles (2) Mild Impairment: Is able to step around both cones, but must slow down and adjust steps to clear cones. 8. Stairs (1) Moderate Impairment: Two feet to a stair, must use rail.  TOTAL SCORE: 14 / 24   05/16/24: 5 times sit to stand: 17 sec 2 minute walk test: 275 ft, supervision  Tandem balance: R foot back: 30',  L foot back: 23 SLS:  L foot- 7, R foot- 6   GAIT: Distance walked: 50 ft in gym Assistive device utilized: gait belt Level of assistance: CGA Comments: slight shuffling gait; left foot tends to abduct                                                                                                                                TREATMENT DATE:  05/25/24 Nustep seat 5 level 3 x 5' dynamic warm up Sit to stand holding a yellow medine ball 2 x 10 4 step ups 2  x 10 each leg Standing on blue foam pad x 30 no UE assist Standing on blue foam pad with head turns and nods x 10 each no UE assist BOSU ball alternating lunges 2 x 10; 1 hand assist first set and 2 fingers 2nd set    05/18/24  Standing  Heel raises 2 x 10 3# hip abduction 2 x 10 3# hip extension 2 x 10 Seated toe raises 2  x 10 3# LAQ's 2 hold 2 x 10 each 4 box toe taps 2 x 10 Tandem stance 2 x 30 each no UE support SLS 3 x 10 each intermittent UE support Nustep seat 5 level 3 x 5' to end treatment   05/16/24: Progress Note:  ABC Scale LE MMT 5 Times Sit to stand 2 minute walk test Tandem Balance SLS Nustep, seat 6, level 3, verbal cues to keep spm over 50   05/09/24: Nustep seat 5 [place] x5' dynamic warm up 6in hurdles initially meet feet with Bil UE--> 1UE --> no UE support with step through 5RT 6in hurdles lateral step over 2RT no HHA 6in toe taps alternating 2x 10 6in step up and over 10x each leg with 1 HHA Tandem stance 2x 30 SLS Rt 8, Lt 3  05/06/24 Nustep seat 5 x 5' dynamic warm up Heel raises 2 x 10 4 box toe taps 2 x 10 Sit to stand x 10 no UE assist Lateral step ups 4 box x 10 each way Tandem stance x 30 each   PATIENT EDUCATION:  Education details: Patient educated on exam findings, POC, scope of PT, HEP, and what to expect next visit. Person educated: Patient Education method: Explanation, Demonstration, and Handouts Education comprehension: verbalized understanding,  returned demonstration, verbal cues required, and tactile cues required  HOME EXERCISE PROGRAM: Access Code: [email or web address] URL: [email or web address] Date: 03/30/2024 Prepared by: [facility] - Rehab  Exercises - Sit to Stand with Armchair  - 2 x daily - 7 x weekly - 2 sets - 5 reps - standing single leg balance at the counter (try to not hold on)  - 2 x daily - 7 x weekly - 1 sets - 5 reps - 15 sec hold - tandem stance balance; try not to hold on  - 2 x daily - 7 x weekly - 1 sets - 5 reps - 15 sec hold - Standing Hip Abduction with Counter Support  - 2 x daily - 7 x weekly - 2 sets - 10 reps  ASSESSMENT:  CLINICAL IMPRESSION: Max challenge today with standing on foam with looking up; has one loss of balance but able to recover with min A from therapist. Also challenged with BOSU ball lunges and needs at least 2 fingers on // bars to avoid loss of balance.  She tends to turn quickly and take a misstep so advised her to be careful and take her time with direction changes.   Patient will benefit from continued skilled therapy services to address deficits and promote return to optimal function.       Patient is a 78 y.o. female who was seen today for physical therapy evaluation and treatment for M25.561,M25.562 (ICD-10-CM) - Acute pain of both knees W19.XXXD (ICD-10-CM) - Fall, subsequent encounter. Patient demonstrates decreased strength, balance deficits and gait abnormalities which are negatively impacting patient ability to perform ADLs and functional mobility tasks. Patient will benefit from skilled physical therapy services to address these deficits to improve level of function with ADLs, functional mobility tasks, and reduce risk for falls.    OBJECTIVE IMPAIRMENTS: Abnormal gait, decreased activity tolerance, decreased balance, difficulty walking, decreased strength, and impaired perceived functional ability.   ACTIVITY LIMITATIONS: standing, stairs, and locomotion  level  PARTICIPATION LIMITATIONS: shopping and community activity  PERSONAL FACTORS: history of seizure, afib are also affecting patient's functional outcome.   REHAB POTENTIAL: Good  CLINICAL DECISION MAKING: Evolving/moderate complexity  EVALUATION COMPLEXITY: Moderate   GOALS: Goals reviewed with  patient? Yes  SHORT TERM GOALS: Target date: 04/20/2024 patient with caregiver assist will be independent with initial HEP  Baseline: Reports everyday compliance Goal status: MET  2.  Patient and caregiver will report 30% improvement overall  Baseline: Reports 100% improvement, reports husband just lays her clothes out for her at this point on 05/16/24 Goal status: MET   LONG TERM GOALS: Target date: 06/15/2024  Patient will be independent in self management strategies to improve quality of life and functional outcomes Baseline:  Goal status: MET  2.  Patient and caregiver will report 50% improvement overall  Baseline: Reports she's made at least 80% improvement with balance on 05/16/24, 100% improvement with ambulation  Goal status: MET  3.  Patient will able to stand SLS on each leg x 10 to demonstrate improved functional balance  Baseline: see above Goal status: IN PROGRESS  4.  Patient will increase distance on 2 MWT by 100 ft  from initial/baseline with SBA to demonstrate improved functional gait in community  Baseline: 275 ft with supervision on 05/16/24 Goal status: Revised on 05/16/24  5.   Patient will increase left leg MMT's to 4+ to 5/5 to allow navigation of steps without gait deviation or loss of balance  Baseline: see above Goal status: IN PROGRESS  6.  Patient will improve 5 times sit to stand score to 15 sec or less to demonstrate improved functional mobility and increased leg strength.    Baseline: 17 seconds on 05/16/24 Goal status: revised on 05/16/24   PLAN:  PT FREQUENCY: 2x/week  PT DURATION: 6 weeks  PLANNED INTERVENTIONS: [id number]- PT  Re-evaluation, 97110-Therapeutic exercises, 97530- Therapeutic activity, 97112- Neuromuscular re-education, 97535- Self Care, [id number]- Manual therapy, [phone]- Gait training, [phone]- Orthotic Fit/training, [phone]- Canalith repositioning, [id number]- Aquatic Therapy, [phone]- Splinting, [phone]- Wound care (first 20 sq cm), 97598- Wound care (each additional 20 sq cm)Patient/Family education, Balance training, Stair training, Taping, Dry Needling, Joint mobilization, Joint manipulation, Spinal manipulation, Spinal mobilization, Scar mobilization, and DME instructions.   PLAN FOR NEXT SESSION: functional strength and balance; left leg strengthening.  address balance (esp navigating obstacles and SL)  12:27 PM, 05/25/24 Telly Broberg Small Yancy Hascall MPT [facility] [place] [phone]

## 2024-05-27 ENCOUNTER — Telehealth: Payer: Self-pay

## 2024-05-27 NOTE — Telephone Encounter (Signed)
 Copied from CRM #[id number]. Topic: General - Other >> May 27, 2024 12:44 PM Santiya F wrote: Reason for CRM: Patient is calling in because she had a bone density test done on 04/26/24 and she says she received a letter for a denial of payment. Patient wants to know if the office had any information on why it was denied. Please follow up with patient.

## 2024-05-30 ENCOUNTER — Encounter (HOSPITAL_COMMUNITY): Payer: Self-pay

## 2024-05-30 ENCOUNTER — Ambulatory Visit (HOSPITAL_COMMUNITY)

## 2024-05-30 NOTE — Therapy (Signed)
 Patient arrived 20 min late for appointment so we did not see her; she is aware and is aware of next appt Wed.    1:34 PM, 05/30/24 Devan Danzer Small Itzelle Gains MPT [facility] [place] [phone]

## 2024-06-01 ENCOUNTER — Ambulatory Visit (HOSPITAL_COMMUNITY)

## 2024-06-01 ENCOUNTER — Encounter (HOSPITAL_COMMUNITY): Payer: Self-pay

## 2024-06-01 DIAGNOSIS — M25561 Pain in right knee: Secondary | ICD-10-CM | POA: Diagnosis not present

## 2024-06-01 DIAGNOSIS — R2689 Other abnormalities of gait and mobility: Secondary | ICD-10-CM | POA: Diagnosis not present

## 2024-06-01 DIAGNOSIS — R262 Difficulty in walking, not elsewhere classified: Secondary | ICD-10-CM | POA: Diagnosis not present

## 2024-06-01 DIAGNOSIS — M25562 Pain in left knee: Secondary | ICD-10-CM | POA: Diagnosis not present

## 2024-06-01 DIAGNOSIS — Z7409 Other reduced mobility: Secondary | ICD-10-CM

## 2024-06-01 DIAGNOSIS — M6281 Muscle weakness (generalized): Secondary | ICD-10-CM

## 2024-06-01 NOTE — Therapy (Signed)
 OUTPATIENT PHYSICAL THERAPY LOWER EXTREMITY TREATMENT Patient Name: Tara [name] MRN: [id number] DOB:06/14/1946, 78 y.o., female Today's Date: 06/01/2024     END OF SESSION:  PT End of Session - 06/01/24 1059     Visit Number 9    Number of Visits 12    Date for PT Re-Evaluation 06/06/24    Authorization Type BCBS Medicare    Authorization Time Period no auth required    PT Start Time 1101    PT Stop Time 1140    PT Time Calculation (min) 39 min    Activity Tolerance Patient tolerated treatment well    Behavior During Therapy WFL for tasks assessed/performed           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   Angio-edema    Anxiety    Breast cancer (HCC) 2008   right - s/p lumpectomy->chemo, radiation   Depression    Dysrhythmia    hx SVT   Family history of colon cancer    Family history of prostate cancer    GERD (gastroesophageal reflux disease)    H/O: hysterectomy    Hiatal hernia    History of cancer chemotherapy    History of radiation therapy    Hyperlipidemia    Hypertension 01/25/2018   Nonischemic cardiomyopathy (HCC)    Personal history of chemotherapy    Personal history of radiation therapy 01/05/209   SVT (supraventricular tachycardia) (HCC)    short RP SVT documented 5/14   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HCC)    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)   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   Past Surgical History:  Procedure Laterality Date   ABDOMINAL HYSTERECTOMY  1994   fibroids,    BIOPSY  06/19/2016   Procedure: BIOPSY;  Surgeon: Lamar CHRISTELLA Hollingshead, MD;  Location: [facility];  Service: Endoscopy;;  gastric duodenum   BREAST BIOPSY Right 12/16/2006   malignant   BREAST EXCISIONAL BIOPSY Right 2018   benign lumpectomy   BREAST LUMPECTOMY Right 02/2007   BREAST LUMPECTOMY WITH RADIOACTIVE SEED  LOCALIZATION Right 07/29/2017   Procedure: RIGHT BREAST LUMPECTOMY WITH RADIOACTIVE SEED LOCALIZATION;  Surgeon: Gail Favorite, MD;  Location: [facility] OR;  Service: General;  Laterality: Right;   BREAST SURGERY Right 2008   lumpectomy, cancer   CARDIAC CATHETERIZATION     CARDIOVERSION N/A 08/06/2023   Procedure: CARDIOVERSION (CATH LAB);  Surgeon: Rolan Ezra RAMAN, MD;  Location: [facility] INVASIVE CV LAB;  Service: Cardiovascular;  Laterality: N/A;   CHOLECYSTECTOMY  1999   COLONOSCOPY  2008   Dr. Harvey: multiple polyps. Path not available at time of visit.    COLONOSCOPY WITH PROPOFOL  N/A 04/25/2014   Dr. harvey: Multiple tubular adenomas removed. Diverticulosis. Moderate internal hemorrhoids. Next colonoscopy planned for August 2018.   CRANIOTOMY Bilateral 03/24/2022   Procedure: Bifrontal craniotomy for resection of meningioma;  Surgeon: Gillie Duncans, MD;  Location: [facility] OR;  Service: Neurosurgery;  Laterality: Bilateral;   ESOPHAGOGASTRODUODENOSCOPY (EGD) WITH PROPOFOL  N/A 06/19/2016   Procedure: ESOPHAGOGASTRODUODENOSCOPY (EGD) WITH PROPOFOL ;  Surgeon: Lamar CHRISTELLA Hollingshead, MD;  Location: [facility];  Service: Endoscopy;  Laterality: N/A;   MASTECTOMY Right 2019   MASTECTOMY W/ SENTINEL NODE BIOPSY Right 06/09/2018   Procedure: RIGHT TOTAL MASTECTOMY WITH SENTINEL LYMPH NODE BIOPSY;  Surgeon: Gail Favorite, MD;  Location:  [facility] OR;  Service: General;  Laterality: Right;   POLYPECTOMY N/A 04/25/2014   Procedure: POLYPECTOMY;  Surgeon: Margo LITTIE Haddock, MD;  Location: [facility] ORS;  Service: Endoscopy;  Laterality: N/A;  Ascending and Decending Colon x3 , Transverse colon x2, rectal   PORTACATH PLACEMENT N/A 06/09/2018   Procedure: INSERTION PORT-A-CATH;  Surgeon: Gail Favorite, MD;  Location: [facility] OR;  Service: General;  Laterality: N/A;   RIGHT/LEFT HEART CATH AND CORONARY ANGIOGRAPHY N/A 07/10/2017   Procedure: RIGHT/LEFT HEART CATH AND CORONARY ANGIOGRAPHY;  Surgeon: Rolan Ezra RAMAN, MD;  Location: [facility]  INVASIVE CV LAB;  Service: Cardiovascular;  Laterality: N/A;   Patient Active Problem List   Diagnosis Date Noted   Encounter for end of life care 04/20/2024   Urinary frequency 04/07/2024   Urinary incontinence 04/07/2024   Screening for colon cancer 04/07/2024   Bilateral knee pain 01/22/2024   Fall 01/14/2024   Pain and swelling of left lower leg 08/12/2023   Acute pain of both knees 08/12/2023   Acute foot pain, left 08/12/2023   Elevated liver enzymes 08/02/2023   Encounter for immunization 08/02/2023   Encounter for Medicare annual examination with abnormal findings 08/02/2023   Community acquired pneumonia of left lower lobe of lung 06/19/2023   Dysuria 06/03/2023   Impaired mobility and ADLs 01/05/2023   IGT (impaired glucose tolerance) 01/05/2023   Non-ischemic cardiomyopathy (HCC) 01/05/2023   Constipation 10/26/2022   Paroxysmal atrial fibrillation (HCC) 09/20/2022   Chronic anticoagulation 09/20/2022   Hiatal hernia 09/20/2022   Aspiration pneumonia (HCC) 08/14/2022   Seizure disorder (HCC)    Lethargy 05/15/2022   Dysfunction of right cardiac ventricle 05/07/2022   Acute postoperative anemia due to expected blood loss 05/07/2022   Dysphagia 05/07/2022   Palliative care encounter    Acute respiratory failure with hypoxia (HCC)    Aspiration into airway    Pressure injury of skin 04/07/2022   Meningioma (HCC) 03/24/2022   Seizure (HCC) 03/15/2022   Atrial fibrillation, chronic (HCC) 03/15/2022   Protein-calorie malnutrition, moderate (HCC) 03/15/2022   Atrial fibrillation with RVR (HCC)    Metabolic acidosis 02/27/2022   Anxiety and depression 02/27/2022   Meningioma, cerebral (HCC) 02/27/2022   Normocytic anemia 02/27/2022   Acute renal failure (ARF) (HCC) 02/27/2022   Hemorrhoids 01/04/2021   Ankle deformity, right 09/12/2020   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   Port-A-Cath in place 09/28/2018   Colon adenomas 08/06/2018   Malignant  neoplasm of midline of right female breast (HCC) 06/09/2018   Hypertension 01/25/2018   AKI (acute kidney injury) (HCC) 10/25/2016   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   Acute on chronic combined systolic and diastolic CHF (congestive heart failure) (HCC)    Demand ischemia (HCC)    Hypokalemia 09/29/2016   Prolonged Q-T interval on ECG 09/29/2016   Chronic kidney disease (CKD), stage III (moderate) (HCC) 09/29/2016   Leukocytosis 09/18/2016   Hospital discharge follow-up 07/06/2016   Abnormal CT scan, chest    Upper airway cough syndrome  vs Cough variant asthma  04/09/2016   H/O fall 03/23/2016   Chronic cough 03/17/2016   Seasonal allergies 08/08/2014   Family history of colon cancer 03/30/2014   Osteoporosis 02/14/2014   Metabolic syndrome X 02/05/2014   SVT (supraventricular tachycardia) (HCC) 03/14/2013   Chronic HFrEF (heart failure with reduced ejection fraction) (HCC) 03/14/2013   Vitamin D  deficiency 07/23/2010   Malignant neoplasm of right breast (HCC) 12/13/2007   Hyperlipidemia 12/13/2007   Anxiety state 12/13/2007  Depression, major, single episode, 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   GERD 12/13/2007    PCP: Antonetta Rollene BRAVO MD  REFERRING PROVIDER: Antonetta Rollene BRAVO, MD  REFERRING DIAG: [phone] (ICD-10-CM) - Acute pain of both knees W19.XXXD (ICD-10-CM) - Fall, subsequent encounter  THERAPY DIAG:  Acute pain of both knees  Difficulty in walking, not elsewhere classified  Impaired functional mobility, balance, and endurance  Other abnormalities of gait and mobility  Muscle weakness (generalized)  Rationale for Evaluation and Treatment: Rehabilitation  ONSET DATE: back in the spring; April or March  SUBJECTIVE:   SUBJECTIVE STATEMENT: No pain today; no new fall; sometimes she is doing some cooking.     EVAL: Got up and go dizzy and fell back in spring; arrives with her husband who gives most of patient medical history.  She is  wearing a gait belt and husband giving CGA; husband states someone is with her at all times and uses CGA with gait belt when up and walking.    PERTINENT HISTORY: Afib Seizures Tumor removed 2 years ago  History of seizures  PAIN:  Are you having pain? Not today; just stiff in the morning  PRECAUTIONS: Fall  RED FLAGS: None   WEIGHT BEARING RESTRICTIONS: No  FALLS:  Has patient fallen in last 6 months? Yes. Number of falls 1  LIVING ENVIRONMENT: Lives with: lives with their spouse Lives in: House/apartment Stairs: No Has following equipment at home: Vannie - 2 wheeled and Ramped entry  OCCUPATION: retired  PLOF: Independent  PATIENT GOALS: no more falls  NEXT MD VISIT: end of the month  OBJECTIVE:  Note: Objective measures were completed at Evaluation unless otherwise noted.  DIAGNOSTIC FINDINGS:   PATIENT SURVEYS: (husband assists in answers) ABC scale: The Activities-Specific Balance Confidence (ABC) Scale 0% 10 20 30  40 50 60 70 80 90 100% No confidence<->completely confident  "How confident are you that you will not lose your balance or become unsteady when you . . .   40.6%                                                   Total: #/16 40.6%     05/16/24: Total ABC score: 1370 / 1600 = 85.6 %    COGNITION: Overall cognitive status: History of cognitive impairments - at baseline  Reports some memory loss after tumor removal    SENSATION: WFL  EDEMA:  None noted   POSTURE: rounded shoulders and forward head  PALPATION: No complaint of soreness today  LOWER EXTREMITY ROM:  Active ROM Right eval Left eval  Hip flexion    Hip extension    Hip abduction    Hip adduction    Hip internal rotation    Hip external rotation    Knee flexion    Knee extension    Ankle dorsiflexion    Ankle plantarflexion    Ankle inversion    Ankle eversion     (Blank rows = not tested)  LOWER EXTREMITY MMT:  MMT Right eval Left eval  R 05/16/24 L 05/16/24  Hip flexion 4+ 4 4+ 4+  Hip extension      Hip abduction Seated 4 Seated 4- 4+ 4+  Hip adduction      Hip internal rotation      Hip external rotation  Knee flexion      Knee extension 5 4+ 5 5  Ankle dorsiflexion Limited motion; 4+ Limited motion 4+ Limited motion 4+ Limited motion 4+  Ankle plantarflexion      Ankle inversion      Ankle eversion       (Blank rows = not tested)   FUNCTIONAL TESTS:  5 times sit to stand: 23.83 sec using hands to push up to stand 2 minute walk test: 224 ft with gait belt and CGA  SLS unable  Tandem stance right foot back 15; left foot back 3   DGI 1. Gait level surface (2) Mild Impairment: Walks 20', uses assistive devices, slower speed, mild gait deviations. 2. Change in gait speed (2) Mild Impairment: Is able to change speed but demonstrates mild gait deviations, or not gait deviations but unable to achieve a significant change in velocity, or uses an assistive device. 3. Gait with horizontal head turns (2) Mild Impairment: Performs head turns smoothly with slight change in gait velocity, i.e., minor disruption to smooth gait path or uses walking aid. 4. Gait with vertical head turns (2) Mild Impairment: Performs head turns smoothly with slight change in gait velocity, i.e., minor disruption to smooth gait path or uses walking aid. 5. Gait and pivot turn (2) Mild Impairment: Pivot turns safely in > 3 seconds and stops with no loss of balance. 6. Step over obstacle (1) Moderate Impairment: Is able to step over box but must stop, then step over. May require verbal cueing. 7. Step around obstacles (2) Mild Impairment: Is able to step around both cones, but must slow down and adjust steps to clear cones. 8. Stairs (1) Moderate Impairment: Two feet to a stair, must use rail.  TOTAL SCORE: 14 / 24   05/16/24: 5 times sit to stand: 17 sec 2 minute walk test: 275 ft, supervision  Tandem balance: R foot back: 30',  L foot back: 23 SLS:  L foot- 7, R foot- 6   GAIT: Distance walked: 50 ft in gym Assistive device utilized: gait belt Level of assistance: CGA Comments: slight shuffling gait; left foot tends to abduct                                                                                                                                TREATMENT DATE:  06/01/24: Nustep [place] trail seat 5 L3 resistance LE only x 5' dynamic warm up Sit to stand holding a blue weighted medine ball 2 x 10 Standing in // bars  Forward 6in hurdles step to pattern 4RT minimal HHA Lateral 6in hurdles 4RT Squat front of chair 12x Standing on blue foam pad x 30 no UE assist Standing on blue foam pad with head turns and nods x 10 each no UE assist Standing on blue foam pad with UE flexion 15x Heel raises with UE flexion 10x BOSU ball alternating lunges 2 x 10; 1 hand assist first  set and 2 fingers 2nd set  05/25/24 Nustep seat 5 level 3 x 5' dynamic warm up Sit to stand holding a yellow medine ball 2 x 10 4 step ups 2  x 10 each leg Standing on blue foam pad x 30 no UE assist Standing on blue foam pad with head turns and nods x 10 each no UE assist BOSU ball alternating lunges 2 x 10; 1 hand assist first set and 2 fingers 2nd set    05/18/24  Standing  Heel raises 2 x 10 3# hip abduction 2 x 10 3# hip extension 2 x 10 Seated toe raises 2 x 10 3# LAQ's 2 hold 2 x 10 each 4 box toe taps 2 x 10 Tandem stance 2 x 30 each no UE support SLS 3 x 10 each intermittent UE support Nustep seat 5 level 3 x 5' to end treatment   05/16/24: Progress Note:  ABC Scale LE MMT 5 Times Sit to stand 2 minute walk test Tandem Balance SLS Nustep, seat 6, level 3, verbal cues to keep spm over 50   05/09/24: Nustep seat 5 [place] x5' dynamic warm up 6in hurdles initially meet feet with Bil UE--> 1UE --> no UE support with step through 5RT 6in hurdles lateral step over 2RT no HHA 6in toe taps alternating 2x  10 6in step up and over 10x each leg with 1 HHA Tandem stance 2x 30 SLS Rt 8, Lt 3  05/06/24 Nustep seat 5 x 5' dynamic warm up Heel raises 2 x 10 4 box toe taps 2 x 10 Sit to stand x 10 no UE assist Lateral step ups 4 box x 10 each way Tandem stance x 30 each   PATIENT EDUCATION:  Education details: Patient educated on exam findings, POC, scope of PT, HEP, and what to expect next visit. Person educated: Patient Education method: Explanation, Demonstration, and Handouts Education comprehension: verbalized understanding, returned demonstration, verbal cues required, and tactile cues required  HOME EXERCISE PROGRAM: Access Code: [email or web address] URL: [email or web address] Date: 03/30/2024 Prepared by: [facility]  Exercises - Sit to Stand with Armchair  - 2 x daily - 7 x weekly - 2 sets - 5 reps - standing single leg balance at the counter (try to not hold on)  - 2 x daily - 7 x weekly - 1 sets - 5 reps - 15 sec hold - tandem stance balance; try not to hold on  - 2 x daily - 7 x weekly - 1 sets - 5 reps - 15 sec hold - Standing Hip Abduction with Counter Support  - 2 x daily - 7 x weekly - 2 sets - 10 reps  ASSESSMENT:  CLINICAL IMPRESSION: Session focus with proximal strengthening and dynamic balance training.  Added hurdles to improve SLS time with ability to regain LOB with min A from therapist.  Pt continues to be challenges with dynamic surface.  No reports of pain through session.  Improved activity tolerance as no rest breaks needed this session.   Patient is a 78 y.o. female who was seen today for physical therapy evaluation and treatment for M25.561,M25.562 (ICD-10-CM) - Acute pain of both knees W19.XXXD (ICD-10-CM) - Fall, subsequent encounter. Patient demonstrates decreased strength, balance deficits and gait abnormalities which are negatively impacting patient ability to perform ADLs and functional mobility tasks. Patient will benefit from skilled physical  therapy services to address these deficits to improve level of function with ADLs, functional mobility tasks, and reduce risk for  falls.    OBJECTIVE IMPAIRMENTS: Abnormal gait, decreased activity tolerance, decreased balance, difficulty walking, decreased strength, and impaired perceived functional ability.   ACTIVITY LIMITATIONS: standing, stairs, and locomotion level  PARTICIPATION LIMITATIONS: shopping and community activity  PERSONAL FACTORS: history of seizure, afib are also affecting patient's functional outcome.   REHAB POTENTIAL: Good  CLINICAL DECISION MAKING: Evolving/moderate complexity  EVALUATION COMPLEXITY: Moderate   GOALS: Goals reviewed with patient? Yes  SHORT TERM GOALS: Target date: 04/20/2024 patient with caregiver assist will be independent with initial HEP  Baseline: Reports everyday compliance Goal status: MET  2.  Patient and caregiver will report 30% improvement overall  Baseline: Reports 100% improvement, reports husband just lays her clothes out for her at this point on 05/16/24 Goal status: MET   LONG TERM GOALS: Target date: 06/15/2024  Patient will be independent in self management strategies to improve quality of life and functional outcomes Baseline:  Goal status: MET  2.  Patient and caregiver will report 50% improvement overall  Baseline: Reports she's made at least 80% improvement with balance on 05/16/24, 100% improvement with ambulation  Goal status: MET  3.  Patient will able to stand SLS on each leg x 10 to demonstrate improved functional balance  Baseline: see above Goal status: IN PROGRESS  4.  Patient will increase distance on 2 MWT by 100 ft  from initial/baseline with SBA to demonstrate improved functional gait in community  Baseline: 275 ft with supervision on 05/16/24 Goal status: Revised on 05/16/24  5.   Patient will increase left leg MMT's to 4+ to 5/5 to allow navigation of steps without gait deviation or loss of  balance  Baseline: see above Goal status: IN PROGRESS  6.  Patient will improve 5 times sit to stand score to 15 sec or less to demonstrate improved functional mobility and increased leg strength.    Baseline: 17 seconds on 05/16/24 Goal status: revised on 05/16/24   PLAN:  PT FREQUENCY: 2x/week  PT DURATION: 6 weeks  PLANNED INTERVENTIONS: 97164- PT Re-evaluation, 97110-Therapeutic exercises, 97530- Therapeutic activity, 97112- Neuromuscular re-education, 97535- Self Care, [id number]- Manual therapy, [phone]- Gait training, [phone]- Orthotic Fit/training, [phone]- Canalith repositioning, [id number]- Aquatic Therapy, [phone]- Splinting, [phone]- Wound care (first 20 sq cm), 97598- Wound care (each additional 20 sq cm)Patient/Family education, Balance training, Stair training, Taping, Dry Needling, Joint mobilization, Joint manipulation, Spinal manipulation, Spinal mobilization, Scar mobilization, and DME instructions.   PLAN FOR NEXT SESSION: functional strength and balance; left leg strengthening.  address balance (esp navigating obstacles and SL)  Augustin Mclean, LPTA/CLT; CBIS [phone]  11:47 AM, 06/01/24

## 2024-06-06 ENCOUNTER — Ambulatory Visit (HOSPITAL_COMMUNITY)

## 2024-06-06 DIAGNOSIS — M25561 Pain in right knee: Secondary | ICD-10-CM

## 2024-06-06 DIAGNOSIS — M6281 Muscle weakness (generalized): Secondary | ICD-10-CM | POA: Diagnosis not present

## 2024-06-06 DIAGNOSIS — R262 Difficulty in walking, not elsewhere classified: Secondary | ICD-10-CM | POA: Diagnosis not present

## 2024-06-06 DIAGNOSIS — R2689 Other abnormalities of gait and mobility: Secondary | ICD-10-CM

## 2024-06-06 DIAGNOSIS — M25562 Pain in left knee: Secondary | ICD-10-CM | POA: Diagnosis not present

## 2024-06-06 DIAGNOSIS — Z7409 Other reduced mobility: Secondary | ICD-10-CM

## 2024-06-06 NOTE — Therapy (Signed)
 OUTPATIENT PHYSICAL THERAPY LOWER EXTREMITY TREATMENT PHYSICAL THERAPY DISCHARGE SUMMARY  Visits from Start of Care: 10  Current functional level related to goals / functional outcomes: See below   Remaining deficits: See below   Education / Equipment: HEP   Patient agrees to discharge. Patient goals were partially met. Patient is being discharged due to being pleased with the current functional level.    Patient Name: Tara [name] MRN: [id number] DOB:June 05, 1946, 78 y.o., female, female Today's Date: 06/06/2024     END OF SESSION:  PT End of Session - 06/06/24 1150     Visit Number 10    Number of Visits 12    Date for PT Re-Evaluation 06/06/24    Authorization Type BCBS Medicare    Authorization Time Period no auth required    PT Start Time 1148    PT Stop Time 1217    PT Time Calculation (min) 29 min    Activity Tolerance Patient tolerated treatment well    Behavior During Therapy [facility] for tasks assessed/performed           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   Angio-edema    Anxiety    Breast cancer (HCC) 2008   right - s/p lumpectomy->chemo, radiation   Depression    Dysrhythmia    hx SVT   Family history of colon cancer    Family history of prostate cancer    GERD (gastroesophageal reflux disease)    H/O: hysterectomy    Hiatal hernia    History of cancer chemotherapy    History of radiation therapy    Hyperlipidemia    Hypertension 01/25/2018   Nonischemic cardiomyopathy (HCC)    Personal history of chemotherapy    Personal history of radiation therapy 01/05/209   SVT (supraventricular tachycardia) (HCC)    short RP SVT documented 5/14   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HCC)    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)   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   Past Surgical History:  Procedure Laterality Date    ABDOMINAL HYSTERECTOMY  1994   fibroids,    BIOPSY  06/19/2016   Procedure: BIOPSY;  Surgeon: Lamar CHRISTELLA Hollingshead, MD;  Location: [facility];  Service: Endoscopy;;  gastric duodenum   BREAST BIOPSY Right 12/16/2006   malignant   BREAST EXCISIONAL BIOPSY Right 2018   benign lumpectomy   BREAST LUMPECTOMY Right 02/2007   BREAST LUMPECTOMY WITH RADIOACTIVE SEED LOCALIZATION Right 07/29/2017   Procedure: RIGHT BREAST LUMPECTOMY WITH RADIOACTIVE SEED LOCALIZATION;  Surgeon: Gail Favorite, MD;  Location: [facility] OR;  Service: General;  Laterality: Right;   BREAST SURGERY Right 2008   lumpectomy, cancer   CARDIAC CATHETERIZATION     CARDIOVERSION N/A 08/06/2023   Procedure: CARDIOVERSION (CATH LAB);  Surgeon: Rolan Ezra RAMAN, MD;  Location: [facility] INVASIVE CV LAB;  Service: Cardiovascular;  Laterality: N/A;   CHOLECYSTECTOMY  1999   COLONOSCOPY  2008   Dr. Harvey: multiple polyps. Path not available at time of visit.    COLONOSCOPY WITH PROPOFOL  N/A 04/25/2014   Dr. harvey: Multiple tubular adenomas removed. Diverticulosis. Moderate internal hemorrhoids. Next colonoscopy planned for August 2018.   CRANIOTOMY Bilateral 03/24/2022   Procedure: Bifrontal craniotomy for resection of meningioma;  Surgeon: Gillie Duncans, MD;  Location: [facility] OR;  Service: Neurosurgery;  Laterality: Bilateral;  ESOPHAGOGASTRODUODENOSCOPY (EGD) WITH PROPOFOL  N/A 06/19/2016   Procedure: ESOPHAGOGASTRODUODENOSCOPY (EGD) WITH PROPOFOL ;  Surgeon: Lamar CHRISTELLA Hollingshead, MD;  [facility];  Service: Endoscopy;  Laterality: N/A;   MASTECTOMY Right 2019   MASTECTOMY W/ SENTINEL NODE BIOPSY Right 06/09/2018   Procedure: RIGHT TOTAL MASTECTOMY WITH SENTINEL LYMPH NODE BIOPSY;  Surgeon: Gail Favorite, MD;  Location: [facility] OR;  Service: General;  Laterality: Right;   POLYPECTOMY N/A 04/25/2014   Procedure: POLYPECTOMY;  Surgeon: Margo LITTIE Haddock, MD;  Location: [facility] ORS;  Service: Endoscopy;  Laterality: N/A;  Ascending and Decending Colon  x3 , Transverse colon x2, rectal   PORTACATH PLACEMENT N/A 06/09/2018   Procedure: INSERTION PORT-A-CATH;  Surgeon: Gail Favorite, MD;  Location: [facility] OR;  Service: General;  Laterality: N/A;   RIGHT/LEFT HEART CATH AND CORONARY ANGIOGRAPHY N/A 07/10/2017   Procedure: RIGHT/LEFT HEART CATH AND CORONARY ANGIOGRAPHY;  Surgeon: Rolan Ezra RAMAN, MD;  Location: [facility] INVASIVE CV LAB;  Service: Cardiovascular;  Laterality: N/A;   Patient Active Problem List   Diagnosis Date Noted   Encounter for end of life care 04/20/2024   Urinary frequency 04/07/2024   Urinary incontinence 04/07/2024   Screening for colon cancer 04/07/2024   Bilateral knee pain 01/22/2024   Fall 01/14/2024   Pain and swelling of left lower leg 08/12/2023   Acute pain of both knees 08/12/2023   Acute foot pain, left 08/12/2023   Elevated liver enzymes 08/02/2023   Encounter for immunization 08/02/2023   Encounter for Medicare annual examination with abnormal findings 08/02/2023   Community acquired pneumonia of left lower lobe of lung 06/19/2023   Dysuria 06/03/2023   Impaired mobility and ADLs 01/05/2023   IGT (impaired glucose tolerance) 01/05/2023   Non-ischemic cardiomyopathy (HCC) 01/05/2023   Constipation 10/26/2022   Paroxysmal atrial fibrillation (HCC) 09/20/2022   Chronic anticoagulation 09/20/2022   Hiatal hernia 09/20/2022   Aspiration pneumonia (HCC) 08/14/2022   Seizure disorder (HCC)    Lethargy 05/15/2022   Dysfunction of right cardiac ventricle 05/07/2022   Acute postoperative anemia due to expected blood loss 05/07/2022   Dysphagia 05/07/2022   Palliative care encounter    Acute respiratory failure with hypoxia (HCC)    Aspiration into airway    Pressure injury of skin 04/07/2022   Meningioma (HCC) 03/24/2022   Seizure (HCC) 03/15/2022   Atrial fibrillation, chronic (HCC) 03/15/2022   Protein-calorie malnutrition, moderate (HCC) 03/15/2022   Atrial fibrillation with RVR (HCC)    Metabolic  acidosis 02/27/2022   Anxiety and depression 02/27/2022   Meningioma, cerebral (HCC) 02/27/2022   Normocytic anemia 02/27/2022   Acute renal failure (ARF) (HCC) 02/27/2022   Hemorrhoids 01/04/2021   Ankle deformity, right 09/12/2020   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   Port-A-Cath in place 09/28/2018   Colon adenomas 08/06/2018   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 (HCC) 06/09/2018   Hypertension 01/25/2018   AKI (acute kidney injury) (HCC) 10/25/2016   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   Acute on chronic combined systolic and diastolic CHF (congestive heart failure) (HCC)    Demand ischemia (HCC)    Hypokalemia 09/29/2016   Prolonged Q-T interval on ECG 09/29/2016   Chronic kidney disease (CKD), stage III (moderate) (HCC) 09/29/2016   Leukocytosis 09/18/2016   Hospital discharge follow-up 07/06/2016   Abnormal CT scan, chest    Upper airway cough syndrome  vs Cough variant asthma  04/09/2016   H/O fall 03/23/2016   Chronic cough 03/17/2016   Seasonal allergies 08/08/2014  Family history of colon cancer 03/30/2014   Osteoporosis 02/14/2014   Metabolic syndrome X 02/05/2014   SVT (supraventricular tachycardia) (HCC) 03/14/2013   Chronic HFrEF (heart failure with reduced ejection fraction) (HCC) 03/14/2013   Vitamin D  deficiency 07/23/2010   Malignant neoplasm of right breast (HCC) 12/13/2007   Hyperlipidemia 12/13/2007   Anxiety state 12/13/2007   Depression, major, single episode, 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   GERD 12/13/2007    PCP: Antonetta Rollene BRAVO MD  REFERRING PROVIDER: Antonetta Rollene BRAVO, MD  REFERRING DIAG: [phone] (ICD-10-CM) - Acute pain of both knees W19.XXXD (ICD-10-CM) - Fall, subsequent encounter  THERAPY DIAG:  Acute pain of both knees  Difficulty in walking, not elsewhere classified  Impaired functional mobility, balance, and endurance  Other abnormalities of gait and mobility  Rationale  for Evaluation and Treatment: Rehabilitation  ONSET DATE: back in the spring; April or March  SUBJECTIVE:   SUBJECTIVE STATEMENT: About 90% better  EVAL: Got up and go dizzy and fell back in spring; arrives with her husband who gives most of patient medical history.  She is wearing a gait belt and husband giving CGA; husband states someone is with her at all times and uses CGA with gait belt when up and walking.    PERTINENT HISTORY: Afib Seizures Tumor removed 2 years ago  History of seizures  PAIN:  Are you having pain? Not today; just stiff in the morning  PRECAUTIONS: Fall  RED FLAGS: None   WEIGHT BEARING RESTRICTIONS: No  FALLS:  Has patient fallen in last 6 months? Yes. Number of falls 1  LIVING ENVIRONMENT: Lives with: lives with their spouse Lives in: House/apartment Stairs: No Has following equipment at home: Vannie - 2 wheeled and Ramped entry  OCCUPATION: retired  PLOF: Independent  PATIENT GOALS: no more falls  NEXT MD VISIT: end of the month  OBJECTIVE:  Note: Objective measures were completed at Evaluation unless otherwise noted.  DIAGNOSTIC FINDINGS:   PATIENT SURVEYS: (husband assists in answers) ABC scale: The Activities-Specific Balance Confidence (ABC) Scale 0% 10 20 30  40 50 60 70 80 90 100% No confidence<->completely confident  "How confident are you that you will not lose your balance or become unsteady when you . . .   40.6%                                                   Total: #/16 40.6%     05/16/24: Total ABC score: 1370 / 1600 = 85.6 %    COGNITION: Overall cognitive status: History of cognitive impairments - at baseline  Reports some memory loss after tumor removal    SENSATION: WFL  EDEMA:  None noted   POSTURE: rounded shoulders and forward head  PALPATION: No complaint of soreness today  LOWER EXTREMITY ROM:  Active ROM Right eval Left eval  Hip flexion    Hip extension    Hip  abduction    Hip adduction    Hip internal rotation    Hip external rotation    Knee flexion    Knee extension    Ankle dorsiflexion    Ankle plantarflexion    Ankle inversion    Ankle eversion     (Blank rows = not tested)  LOWER EXTREMITY MMT:  MMT Right eval Left eval R 05/16/24 L 05/16/24  Hip  flexion 4+ 4 4+ 4+  Hip extension      Hip abduction Seated 4 Seated 4- 4+ 4+  Hip adduction      Hip internal rotation      Hip external rotation      Knee flexion      Knee extension 5 4+ 5 5  Ankle dorsiflexion Limited motion; 4+ Limited motion 4+ Limited motion 4+ Limited motion 4+  Ankle plantarflexion      Ankle inversion      Ankle eversion       (Blank rows = not tested)   FUNCTIONAL TESTS:  5 times sit to stand: 23.83 sec using hands to push up to stand 2 minute walk test: 224 ft with gait belt and CGA  SLS unable  Tandem stance right foot back 15; left foot back 3   DGI 1. Gait level surface (2) Mild Impairment: Walks 20', uses assistive devices, slower speed, mild gait deviations. 2. Change in gait speed (2) Mild Impairment: Is able to change speed but demonstrates mild gait deviations, or not gait deviations but unable to achieve a significant change in velocity, or uses an assistive device. 3. Gait with horizontal head turns (2) Mild Impairment: Performs head turns smoothly with slight change in gait velocity, i.e., minor disruption to smooth gait path or uses walking aid. 4. Gait with vertical head turns (2) Mild Impairment: Performs head turns smoothly with slight change in gait velocity, i.e., minor disruption to smooth gait path or uses walking aid. 5. Gait and pivot turn (2) Mild Impairment: Pivot turns safely in > 3 seconds and stops with no loss of balance. 6. Step over obstacle (1) Moderate Impairment: Is able to step over box but must stop, then step over. May require verbal cueing. 7. Step around obstacles (2) Mild Impairment: Is able to step  around both cones, but must slow down and adjust steps to clear cones. 8. Stairs (1) Moderate Impairment: Two feet to a stair, must use rail.  TOTAL SCORE: 14 / 24   05/16/24: 5 times sit to stand: 17 sec 2 minute walk test: 275 ft, supervision  Tandem balance: R foot back: 30', L foot back: 23 SLS:  L foot- 7, R foot- 6   GAIT: Distance walked: 50 ft in gym Assistive device utilized: gait belt Level of assistance: CGA Comments: slight shuffling gait; left foot tends to abduct                                                                                                                                TREATMENT DATE:  06/06/24 Progress note ABC scale 87.5% 5 time sit to stand 13.83 sec no ue assist 2 MWT 298 ft without AD; no PT assist SLS right 14 left 10 MMT's lower extremity grossly 4+ to 5 throughout Goal review  06/01/24: [place] trail seat 5 L3 resistance LE only x 5' dynamic warm up Sit  to stand holding a blue weighted medine ball 2 x 10 Standing in // bars  Forward 6in hurdles step to pattern 4RT minimal HHA Lateral 6in hurdles 4RT Squat front of chair 12x Standing on blue foam pad x 30 no UE assist Standing on blue foam pad with head turns and nods x 10 each no UE assist Standing on blue foam pad with UE flexion 15x Heel raises with UE flexion 10x BOSU ball alternating lunges 2 x 10; 1 hand assist first set and 2 fingers 2nd set  05/25/24 Nustep seat 5 level 3 x 5' dynamic warm up Sit to stand holding a yellow medine ball 2 x 10 4 step ups 2  x 10 each leg Standing on blue foam pad x 30 no UE assist Standing on blue foam pad with head turns and nods x 10 each no UE assist BOSU ball alternating lunges 2 x 10; 1 hand assist first set and 2 fingers 2nd set    05/18/24  Standing  Heel raises 2 x 10 3# hip abduction 2 x 10 3# hip extension 2 x 10 Seated toe raises 2 x 10 3# LAQ's 2 hold 2 x 10 each 4 box toe taps 2 x 10 Tandem stance 2 x  30 each no UE support SLS 3 x 10 each intermittent UE support Nustep seat 5 level 3 x 5' to end treatment   05/16/24: Progress Note:  ABC Scale LE MMT 5 Times Sit to stand 2 minute walk test Tandem Balance SLS Nustep, seat 6, level 3, verbal cues to keep spm over 50   05/09/24: Nustep seat 5 [place] x5' dynamic warm up 6in hurdles initially meet feet with Bil UE--> 1UE --> no UE support with step through 5RT 6in hurdles lateral step over 2RT no HHA 6in toe taps alternating 2x 10 6in step up and over 10x each leg with 1 HHA Tandem stance 2x 30 SLS Rt 8, Lt 3  05/06/24 Nustep seat 5 x 5' dynamic warm up Heel raises 2 x 10 4 box toe taps 2 x 10 Sit to stand x 10 no UE assist Lateral step ups 4 box x 10 each way Tandem stance x 30 each   PATIENT EDUCATION:  Education details: Patient educated on exam findings, POC, scope of PT, HEP, and what to expect next visit. Person educated: Patient Education method: Explanation, Demonstration, and Handouts Education comprehension: verbalized understanding, returned demonstration, verbal cues required, and tactile cues required  HOME EXERCISE PROGRAM: Access Code: [email or web address] URL: [email or web address] Date: 03/30/2024 Prepared by: [facility] - Rehab  Exercises - Sit to Stand with Armchair  - 2 x daily - 7 x weekly - 2 sets - 5 reps - standing single leg balance at the counter (try to not hold on)  - 2 x daily - 7 x weekly - 1 sets - 5 reps - 15 sec hold - tandem stance balance; try not to hold on  - 2 x daily - 7 x weekly - 1 sets - 5 reps - 15 sec hold - Standing Hip Abduction with Counter Support  - 2 x daily - 7 x weekly - 2 sets - 10 reps  ASSESSMENT:  CLINICAL IMPRESSION: Progress note; patient has met 7/8 goals at this time and is agreeable to discharge.     Patient is a 78 y.o. female who was seen today for physical therapy evaluation and treatment for M25.561,M25.562 (ICD-10-CM) - Acute pain of both knees  W19.XXXD (ICD-10-CM) - Fall,  subsequent encounter. Patient demonstrates decreased strength, balance deficits and gait abnormalities which are negatively impacting patient ability to perform ADLs and functional mobility tasks. Patient will benefit from skilled physical therapy services to address these deficits to improve level of function with ADLs, functional mobility tasks, and reduce risk for falls.    OBJECTIVE IMPAIRMENTS: Abnormal gait, decreased activity tolerance, decreased balance, difficulty walking, decreased strength, and impaired perceived functional ability.   ACTIVITY LIMITATIONS: standing, stairs, and locomotion level  PARTICIPATION LIMITATIONS: shopping and community activity  PERSONAL FACTORS: history of seizure, afib are also affecting patient's functional outcome.   REHAB POTENTIAL: Good  CLINICAL DECISION MAKING: Evolving/moderate complexity  EVALUATION COMPLEXITY: Moderate   GOALS: Goals reviewed with patient? Yes  SHORT TERM GOALS: Target date: 04/20/2024 patient with caregiver assist will be independent with initial HEP  Baseline: Reports everyday compliance Goal status: MET  2.  Patient and caregiver will report 30% improvement overall  Baseline: Reports 100% improvement, reports husband just lays her clothes out for her at this point on 05/16/24 Goal status: MET   LONG TERM GOALS: Target date: 06/15/2024  Patient will be independent in self management strategies to improve quality of life and functional outcomes Baseline:  Goal status: MET  2.  Patient and caregiver will report 50% improvement overall  Baseline: Reports she's made at least 80% improvement with balance on 05/16/24, 100% improvement with ambulation  Goal status: MET  3.  Patient will able to stand SLS on each leg x 10 to demonstrate improved functional balance  Baseline: see above Goal status: met  4.  Patient will increase distance on 2 MWT by 100 ft  from initial/baseline with  SBA to demonstrate improved functional gait in community  Baseline: 275 ft with supervision on 05/16/24; 298 ft 06/06/24 Goal status:in progress  5.   Patient will increase left leg MMT's to 4+ to 5/5 to allow navigation of steps without gait deviation or loss of balance  Baseline: see above Goal status: met  6.  Patient will improve 5 times sit to stand score to 15 sec or less to demonstrate improved functional mobility and increased leg strength.    Baseline: 17 seconds on 05/16/24; 13.83 sec no UE assist Goal status: met   PLAN:  PT FREQUENCY: 2x/week  PT DURATION: 6 weeks  PLANNED INTERVENTIONS: 97164- PT Re-evaluation, 97110-Therapeutic exercises, 97530- Therapeutic activity, 97112- Neuromuscular re-education, 97535- Self Care, [id number]- Manual therapy, [phone]- Gait training, [phone]- Orthotic Fit/training, [phone]- Canalith repositioning, [id number]- Aquatic Therapy, [phone]- Splinting, [phone]- Wound care (first 20 sq cm), 97598- Wound care (each additional 20 sq cm)Patient/Family education, Balance training, Stair training, Taping, Dry Needling, Joint mobilization, Joint manipulation, Spinal manipulation, Spinal mobilization, Scar mobilization, and DME instructions.   PLAN FOR NEXT SESSION: discharge 12:20 PM, 06/06/24 Vennie Waymire Small Delois Silvester MPT [facility] [place] [phone]

## 2024-06-08 ENCOUNTER — Encounter (HOSPITAL_COMMUNITY)

## 2024-06-13 ENCOUNTER — Encounter (HOSPITAL_COMMUNITY)

## 2024-06-14 ENCOUNTER — Other Ambulatory Visit: Payer: Self-pay | Admitting: Neurology

## 2024-06-15 ENCOUNTER — Encounter (HOSPITAL_COMMUNITY)

## 2024-06-23 ENCOUNTER — Other Ambulatory Visit: Payer: Self-pay | Admitting: Family Medicine

## 2024-06-23 DIAGNOSIS — R32 Unspecified urinary incontinence: Secondary | ICD-10-CM

## 2024-06-29 ENCOUNTER — Encounter (HOSPITAL_COMMUNITY): Admitting: Cardiology

## 2024-07-15 ENCOUNTER — Ambulatory Visit (HOSPITAL_COMMUNITY)
Admission: RE | Admit: 2024-07-15 | Discharge: 2024-07-15 | Disposition: A | Discharge status: Home / Self Care | Source: Ambulatory Visit | Attending: Cardiology | Admitting: Cardiology

## 2024-07-15 ENCOUNTER — Ambulatory Visit (HOSPITAL_COMMUNITY): Payer: Self-pay | Admitting: Cardiology

## 2024-07-15 VITALS — BP 114/78 | HR 69 | Wt 152.2 lb

## 2024-07-15 DIAGNOSIS — I480 Paroxysmal atrial fibrillation: Secondary | ICD-10-CM | POA: Insufficient documentation

## 2024-07-15 DIAGNOSIS — N183 Chronic kidney disease, stage 3 unspecified: Secondary | ICD-10-CM | POA: Insufficient documentation

## 2024-07-15 DIAGNOSIS — Z86718 Personal history of other venous thrombosis and embolism: Secondary | ICD-10-CM | POA: Diagnosis not present

## 2024-07-15 DIAGNOSIS — Z79899 Other long term (current) drug therapy: Secondary | ICD-10-CM | POA: Diagnosis not present

## 2024-07-15 DIAGNOSIS — Z853 Personal history of malignant neoplasm of breast: Secondary | ICD-10-CM | POA: Insufficient documentation

## 2024-07-15 DIAGNOSIS — Z7901 Long term (current) use of anticoagulants: Secondary | ICD-10-CM | POA: Insufficient documentation

## 2024-07-15 DIAGNOSIS — I5022 Chronic systolic (congestive) heart failure: Secondary | ICD-10-CM | POA: Diagnosis not present

## 2024-07-15 DIAGNOSIS — I428 Other cardiomyopathies: Secondary | ICD-10-CM | POA: Diagnosis not present

## 2024-07-15 LAB — BASIC METABOLIC PANEL WITH GFR
Anion gap: 8 (ref 5–15)
BUN: 28 mg/dL — ABNORMAL HIGH (ref 8–23)
CO2: 21 mmol/L — ABNORMAL LOW (ref 22–32)
Calcium: 9.3 mg/dL (ref 8.9–10.3)
Chloride: 110 mmol/L (ref 98–111)
Creatinine, Ser: 1.38 mg/dL — ABNORMAL HIGH (ref 0.44–1.00)
GFR, Estimated: 39 mL/min — ABNORMAL LOW (ref >60)
Glucose, Bld: 52 mg/dL — ABNORMAL LOW (ref 70–99)
Potassium: 4.2 mmol/L (ref 3.5–5.1)
Sodium: 139 mmol/L (ref 135–145)

## 2024-07-15 LAB — BRAIN NATRIURETIC PEPTIDE: B Natriuretic Peptide: 76.5 pg/mL (ref 0.0–100.0)

## 2024-07-15 LAB — CBC
HCT: 38.6 % (ref 36.0–46.0)
Hemoglobin: 12.2 g/dL (ref 12.0–15.0)
MCH: 26 pg (ref 26.0–34.0)
MCHC: 31.6 g/dL (ref 30.0–36.0)
MCV: 82.1 fL (ref 80.0–100.0)
Platelets: 189 K/uL (ref 150–400)
RBC: 4.7 MIL/uL (ref 3.87–5.11)
RDW: 16.3 % — ABNORMAL HIGH (ref 11.5–15.5)
WBC: 10 K/uL (ref 4.0–10.5)
nRBC: 0 % (ref 0.0–0.2)

## 2024-07-15 NOTE — Patient Instructions (Signed)
 RESTART Farxiga  10 mg daily.  STOP Sertraline   Labs done today, your results will be available in MyChart, we will contact you for abnormal readings.  REPEAT blood work in 10 days in [place].  Your physician recommends that you schedule a follow-up appointment in: 3 months.  If you have any questions or concerns before your next appointment please send us  a message through [place] or call our office at [phone].    TO LEAVE A MESSAGE FOR THE NURSE SELECT OPTION 2, PLEASE LEAVE A MESSAGE INCLUDING: YOUR NAME DATE OF BIRTH CALL BACK NUMBER REASON FOR CALL**this is important as we prioritize the call backs  YOU WILL RECEIVE A CALL BACK THE SAME DAY AS LONG AS YOU CALL BEFORE 4:00 PM  At the [facility], you and your health needs are our priority. As part of our continuing mission to provide you with exceptional heart care, we have created designated Provider Care Teams. These Care Teams include your primary Cardiologist (physician) and Advanced Practice Providers (APPs- Physician Assistants and Nurse Practitioners) who all work together to provide you with the care you need, when you need it.   You may see any of the following providers on your designated Care Team at your next follow up: Dr Toribio Fuel Dr Ezra Shuck Dr. Ria Commander Dr. Morene Brownie Amy Lenetta, NP Caffie Shed, [place] [facility] [place], [place] Beckey Coe, NP [place] Lee, NP Ellouise Class, NP [name], PharmD Jaun Bash, PharmD   Please be sure to bring in all your medications bottles to every appointment.    Thank you for choosing  [facility]

## 2024-07-17 NOTE — Progress Notes (Signed)
 ADVANCED HF CLINIC NOTE  PCP: Antonetta Rollene BRAVO, MD HF Cardiology: Dr. Rolan  Chief complaint: CHF  78 y.o. AAF with chronic systolic HF, hx of SVT, CKD stage 3 and paroxysmal atrial fibrillation.  Dr. Kelsie with EP initially followed her for a short RP SVT.    Patient was initially found to have low EF in 2014. Echo 6/17 showed EF 35-40%. Cardiolite in 8/17 showed fixed apical defect but no evidence for ischemia. She was admitted in 1/18 with PNA, DVT, and CHF exacerbation.  Echo at that time showed EF down to 20-25%.  She was diuresed and discharged.   Patient was diagnosed with breast cancer in 2008 and had lumpectomy, radiation, and chemotherapy which included epirubicin, and anthracycline.  Recurrent cancer in 2019 with right mastectomy and chemo with cyclophosphamide , MTX, and fluorouracil .   RHC/LHC 10/18: no significant CAD and mildly elevate PCWP.  Cardiac MRI 11/18: EF 49%, no LGE.   Echo 5/21: EF 45%, GHK, mildly decreased RV systolic function.  Echo 7/22: EF 55% with normal RV and IVC.    Patient was in atrial fibrillation with RVR in 6/23, converted back to NSR with amiodarone .   Echo 7/23: EF 40-45%, D-shaped septum, moderate RV dysfunction with mild RV enlargement, IVC normal.   9/24 hospitalized for aspiration PNA  Follow up 10/24, in atrial fibrillation and mildly volume overloaded. Lasix  increased to 40 daily, Farxiga  started and arranged for repeat echo and DCCV.   Echo 11/24 EF 30-35%, RV mildly reduced.  DCCV 08/06/23 to NSR   Echo in 3/25 showed EF 40%, normal RV, IVC normal  Echo 4/25 EF 50-55%, normal RV   Admitted 4/25 post fall on eliquis . Found to be hypotensive and bradycardic on arrival 2/2 polypharmacy. Fall resultant with SDH and L mandible fx. Briefly required dopamine . Echo EF 55%. Neurosurgery ok with Eliquis  at discharge. Discharged with Zio. Amio/Farxiga  and spiro held at discharge. Started on midodrine .   Zio 5/25 showed mostly NSR, no  worrisome arrhythmias, mild bradycardia with average HR 57 bpm.  Today she returns for HF follow up with her husband. She is off Farxiga , not sure why. Weight is up 11 lbs.  She says she has been eating more, does not feel like it is fluid.  No dyspnea with her usual activities.  She is able to walk around the mall.  No problems with stairs.  No chest pain.  No orthopnea/PND.  No lightheadedness or palpitations.  She is in NSR today.   ECG (personally reviewed): NSR, LVH with QRS widening (134 msec)   Labs (10/24): K 4.3, creatinine 1.95, AST 54, ALT 46, hgb 9.8, BNP 392 Labs (1/25): K 4.3, creatinine 1.81 Labs (3/25): LDL 169 Labs (5/25): K 4.6, creatinine 1.51, AST 56, ALT 56 Labs (6/25): K 4.4, creatinine 1.26, LFTs trending down, LDL 105 Labs (8/25): K 4.3, creatinine 1.72, BNP 40   PMH: 1. SVT: Short R-P SVT, followed by Dr. Kelsie.  2. DVT in 1/18 in setting of PNA.  She was on Xarelto  for about 6 months.  3. Breast cancer: Diagnosed on right in 2008.  She had lumpectomy, radiation, and chemotherapy.  She had fluorouracil , epirubicin, Cytoxan .  - Recent mammogram with right breast calcifications => biopsy with complex sclerosing lesion.  - Recurrent cancer 2019, she had right mastectomy and chemotherapy with cyclophosphamide , MTX, fluorouracil .  4. GERD 5. Hyperlipidemia 6. Chronic systolic CHF: Uncertain cause of cardiomyopathy.  Echo in 2014 showed EF 40%.  It is  possible that it is related to epirubicin use (anthracycline).  - Echo (6/17): EF 35-40%, mild LV dilation.  - Cardiolite (8/17): no ischemia, apical lateral/apical fixed perfusion defect.  - Echo (1/18): EF 20-25%, moderate MR, severe TR, D-shaped IV septum, PASP 35 mmHg.  - RHC/LHC (10/18): No angiographic CAD.  Mean RA 5, PA 44/11 mean 26, mean PCWP 18, CI 3.49.  - Cardiac MRI (11/18): EF 49%, diffuse mild hypokinesis, mild RV dilation with normal function, no LGE.  - Echo (5/21): EF 45%, global hypokinesis, mildly  decreased RV systolic function.  - Echo (7/22): EF 55% with normal RV and IVC. - Echo (7/23): EF 40-45%, D-shaped septum, moderate RV dysfunction with mild RV enlargement, IVC normal.  - Echo (11/24): EF 30-35%, RV systolic function reduced, moderate MR and moderate to severe TR - Echo (3/25): EF 40%, normal RV, IVC normal - Echo (4/25): EF 50-55%, normal RV  7. Atrial fibrillation: First noted in 6/23. S/p DCCV 11/24. - Zio (5/25): mostly NSR, no worrisome arrhythmias, mild bradycardia with average HR 57 bpm. 8. Meningioma: Resection in 6/23.  9. Aspiration PNA x 3 episodes. 10. CKD stage 3  FH: Colon cancer.  No known heart disease.   SH: Married, never smoked.  Lives in [place].  Retired engineer, civil (consulting).    ROS: All systems reviewed and negative except as per HPI.   Current Outpatient Medications  Medication Sig Dispense Refill   acetaminophen  (TYLENOL ) 500 MG tablet Take 1,000 mg by mouth every 6 (six) hours as needed for moderate pain (pain score 4-6) or mild pain (pain score 1-3).     bisacodyl  5 MG EC tablet One tablet every 3 days, if needed, for constipation 30 tablet 1   clonazePAM  (KLONOPIN ) 0.5 MG tablet Take 1 tablet (0.5 mg total) by mouth at bedtime. 30 tablet 5   ELIQUIS  5 MG TABS tablet Take 1 tablet (5 mg total) by mouth 2 (two) times daily. 60 tablet 6   fluticasone  (FLONASE ) 50 MCG/ACT nasal spray Place 2 sprays into both nostrils daily. 48 g 3   furosemide  (LASIX ) 20 MG tablet Take 1 tablet (20 mg total) by mouth daily as needed (for wt gain > 3 lb in 24h or > 5 lb in 1wk). 30 tablet 11   levETIRAcetam  (KEPPRA ) 500 MG tablet Take 1 tablet (500 mg total) by mouth 2 (two) times daily. 180 tablet 3   montelukast  (SINGULAIR ) 10 MG tablet Take 1 tablet (10 mg total) by mouth at bedtime. 90 tablet 3   pantoprazole  (PROTONIX ) 40 MG tablet TAKE ONE TABLET BY MOUTH TWICE DAILY 60 tablet 0   polyethylene glycol (MIRALAX ) 17 g packet Take one packet mira lax once daily in 8 ounces  water  28 each 5   potassium chloride  SA (KLOR-CON  M) 20 MEQ tablet Take 1 tablet (20 mEq total) by mouth daily as needed (when take furosemide ). 30 tablet 11   rosuvastatin  (CRESTOR ) 10 MG tablet Take 1 tablet (10 mg total) by mouth daily. 90 tablet 3   solifenacin  (VESICARE ) 5 MG tablet Take 1 tablet (5 mg total) by mouth daily. 30 tablet 2   spironolactone  (ALDACTONE ) 25 MG tablet Take 0.5 tablets (12.5 mg total) by mouth daily. 45 tablet 3   dapagliflozin  propanediol (FARXIGA ) 10 MG TABS tablet Take 1 tablet (10 mg total) by mouth daily. (Patient not taking: Reported on 07/15/2024) 30 tablet 11   hydrOXYzine  (ATARAX ) 10 MG tablet Take 10 mg by mouth at bedtime as needed for  itching.     montelukast  (SINGULAIR ) 10 MG tablet Take 10 mg by mouth at bedtime.     No current facility-administered medications for this encounter.   Wt Readings from Last 3 Encounters:  07/15/24 69 kg (152 lb 3.2 oz)  05/11/24 65.5 kg (144 lb 6.4 oz)  04/20/24 62.6 kg (138 lb)   BP 114/78   Pulse 69   Wt 69 kg (152 lb 3.2 oz)   SpO2 94%   BMI 32.94 kg/m  General: NAD Neck: No JVD, no thyromegaly or thyroid  nodule.  Lungs: Clear to auscultation bilaterally with normal respiratory effort. CV: Nondisplaced PMI.  Heart regular S1/S2, no S3/S4, no murmur.  No peripheral edema.  No carotid bruit.  Normal pedal pulses.  Abdomen: Soft, nontender, no hepatosplenomegaly, no distention.  Skin: Intact without lesions or rashes.  Neurologic: Alert and oriented x 3.  Psych: Normal affect. Extremities: No clubbing or cyanosis.  HEENT: Normal.   Assessment/Plan: 1. Chronic systolic CHF: Long-standing cardiomyopathy, since at least 2014.  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  Could be due to epirubicin exposure with 2008 breast cancer treatment.  Cardiac MRI 11/18 with EF up to 49% with no LGE.  Echo 5/21 with EF 45% range.  Echo 7/22 with EF 55%.  Echo 7/23 showed EF 40-45%, D-shaped septum, mod RV  dysfunction with mild RV enlargement, IVC normal.  Back in atrial fibrillation 10/24, echo 11/24 with EF 30-35%. ? If reduction in EF was due to atrial fibrillation/RVR. Once back in NSR, echo 3/25 showed EF about 40%. Echo in 4/25 showed EF 50-55%.  NYHA class I-II symptoms.  She is not volume overloaded on exam.  GDMT has been limited by low BP.  - Continue spironolactone  12.5 daily.  - Restart Farxiga  10 mg daily.  BMET/BNP today, BMET in 10 days.  - Continue Lasix  20 mg daily PRN.  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   - Off amiodarone  3. CKD stage 3: Baseline SCr around 1.7.  - BMET today.  - Add back Farxiga .  4. Atrial fibrillation: Paroxysmal.  S/p DC-CV 11/24. Zio 5/25 showed mostly NSR, no worrisome arrhythmias. NSR today.  - Continue Eliquis  5 mg bid. CBC.  5. Hyperlipidemia: Continue Crestor .   Follow up in 3 months with APP.   I spent 31 minutes reviewing records, interviewing/examining patient, and managing orders.   Ezra Shuck  07/17/2024

## 2024-07-24 ENCOUNTER — Other Ambulatory Visit: Payer: Self-pay | Admitting: Family Medicine

## 2024-07-25 ENCOUNTER — Other Ambulatory Visit: Payer: Self-pay | Admitting: Family Medicine

## 2024-07-25 ENCOUNTER — Encounter: Payer: Self-pay | Admitting: Radiology

## 2024-07-26 ENCOUNTER — Other Ambulatory Visit (HOSPITAL_COMMUNITY)
Admission: RE | Admit: 2024-07-26 | Discharge: 2024-07-26 | Disposition: A | Discharge status: Home / Self Care | Source: Ambulatory Visit | Attending: Cardiology | Admitting: Cardiology

## 2024-07-26 DIAGNOSIS — D631 Anemia in chronic kidney disease: Secondary | ICD-10-CM | POA: Diagnosis not present

## 2024-07-26 DIAGNOSIS — I10 Essential (primary) hypertension: Secondary | ICD-10-CM | POA: Diagnosis not present

## 2024-07-26 DIAGNOSIS — I5022 Chronic systolic (congestive) heart failure: Secondary | ICD-10-CM | POA: Diagnosis not present

## 2024-07-26 DIAGNOSIS — N1832 Chronic kidney disease, stage 3b: Secondary | ICD-10-CM | POA: Diagnosis not present

## 2024-07-26 LAB — BASIC METABOLIC PANEL WITH GFR
Anion gap: 14 (ref 5–15)
BUN: 43 mg/dL — ABNORMAL HIGH (ref 8–23)
CO2: 18 mmol/L — ABNORMAL LOW (ref 22–32)
Calcium: 9 mg/dL (ref 8.9–10.3)
Chloride: 105 mmol/L (ref 98–111)
Creatinine, Ser: 1.5 mg/dL — ABNORMAL HIGH (ref 0.44–1.00)
GFR, Estimated: 35 mL/min — ABNORMAL LOW (ref >60)
Glucose, Bld: 78 mg/dL (ref 70–99)
Potassium: 5.3 mmol/L — ABNORMAL HIGH (ref 3.5–5.1)
Sodium: 137 mmol/L (ref 135–145)

## 2024-07-26 NOTE — Telephone Encounter (Signed)
 Pt was denied a refill and I wasn't able to see a reason as to why. The pharmacy called asking to refill it. Please call pt and advise.

## 2024-08-02 DIAGNOSIS — I959 Hypotension, unspecified: Secondary | ICD-10-CM | POA: Diagnosis not present

## 2024-08-02 DIAGNOSIS — D631 Anemia in chronic kidney disease: Secondary | ICD-10-CM | POA: Diagnosis not present

## 2024-08-02 DIAGNOSIS — N1832 Chronic kidney disease, stage 3b: Secondary | ICD-10-CM | POA: Diagnosis not present

## 2024-08-04 ENCOUNTER — Other Ambulatory Visit (HOSPITAL_COMMUNITY): Payer: Self-pay

## 2024-08-08 ENCOUNTER — Encounter: Payer: Self-pay | Admitting: Family Medicine

## 2024-08-08 ENCOUNTER — Ambulatory Visit (INDEPENDENT_AMBULATORY_CARE_PROVIDER_SITE_OTHER): Admitting: Family Medicine

## 2024-08-08 ENCOUNTER — Telehealth: Payer: Self-pay

## 2024-08-08 VITALS — BP 117/76 | HR 70 | Resp 16 | Ht <= 58 in

## 2024-08-08 DIAGNOSIS — I110 Hypertensive heart disease with heart failure: Secondary | ICD-10-CM

## 2024-08-08 DIAGNOSIS — I5022 Chronic systolic (congestive) heart failure: Secondary | ICD-10-CM

## 2024-08-08 DIAGNOSIS — J209 Acute bronchitis, unspecified: Secondary | ICD-10-CM | POA: Diagnosis not present

## 2024-08-08 DIAGNOSIS — I10 Essential (primary) hypertension: Secondary | ICD-10-CM

## 2024-08-08 MED ORDER — PENICILLIN V POTASSIUM 500 MG PO TABS: 500.0000 mg | ORAL_TABLET | Freq: Three times a day (TID) | ORAL | 0 refills | Status: AC

## 2024-08-08 MED ORDER — BENZONATATE 200 MG PO CAPS: 200.0000 mg | ORAL_CAPSULE | Freq: Two times a day (BID) | ORAL | 0 refills | Status: DC | PRN

## 2024-08-08 NOTE — Telephone Encounter (Signed)
Pt informed

## 2024-08-08 NOTE — Assessment & Plan Note (Signed)
 Controlled, no change in medication

## 2024-08-08 NOTE — Assessment & Plan Note (Signed)
 TESSALON  PERLES AND PEN v PRESCRIBED, GOOD HYDRATIOIN AND REST ADVISED X 5 DAYS

## 2024-08-08 NOTE — Assessment & Plan Note (Signed)
 STABLE AND CONTROLLED ON CURRENT REGIME

## 2024-08-08 NOTE — Telephone Encounter (Signed)
 Copied from CRM #[id number]. Topic: Clinical - Medication Question >> Aug 08, 2024 10:26 AM Aleatha C wrote: Reason for CRM: Patient has had linger cold and today she has spit up some yellow phlegm she wanted to know if Dr Antonetta could call her something in for it and if she can needs to come in or not please give patient a call concerning so

## 2024-08-08 NOTE — Patient Instructions (Addendum)
 F/U as before   Please take daily montelukast  , and fluticasone  or daily allergies  You are treated for acute bronchitis, penicillin  and tessalon  perles are prescribed  Thanks for choosing [facility], we consider it a privelige to serve you.

## 2024-08-08 NOTE — Progress Notes (Signed)
   Tara [name]     MRN: [id number]      DOB: [birth year]  Chief Complaint  Patient presents with   Cough    Pt complains of cough and sinus congestion on and off for couple weeks. Coughing up yellow mucus. No fever. Has been taking otc cold/flu medication but no improvement     HPI Tara [name] is here with a 2 week h/o nasal congestion with post nasal drainage and cough and chest congestion, sputum today is yellow, denies fever , chills , fatigue or popor APPETITE. hAS USED [facility]c MED[facility] AND IS CONCERNED THAT SYMPPTOMS ARE WORSENING   ROS  Denies chest pains, palpitations and leg swelling Denies abdominal pain, nausea, vomiting,diarrhea or constipation.   Denies dysuria, frequency, hesitancy or incontinence. Denies joint pain, swelling and limitation in mobility. Denies headaches, seizures, numbness, or tingling. Denies depression, anxiety or insomnia. Denies skin break down or rash.   PE  BP 117/76   Pulse 70   Resp 16   Ht 4' 9 (1.448 m)   SpO2 95%   BMI 32.94 kg/m   Patient alert and oriented and in no cardiopulmonary distress.  HEENT: No facial asymmetry, EOMI,     Neck supple .nO SINUS TENDERNESS, NASAL CONGESTION NOTED  Chest: ADEQUATE AIR ENTRY, BIBASIOILAR CRACKLES, NO WHEEZES  CVS: S1, S2 no murmurs, no S3.Regular rate.  ABD: Soft non tender.   Ext: No edema  MS: Adequate ROM spine, shoulders, hips and knees.  Skin: Intact, no ulcerations or rash noted.  Psych: Good eye contact, normal affect. Memory intact not anxious or depressed appearing.  CNS: CN 2-12 intact, power,  normal throughout.no focal deficits noted.   Assessment & Plan  Acute bronchitis TESSALON  PERLES AND PEN v PRESCRIBED, GOOD HYDRATIOIN AND REST ADVISED X 5 DAYS  Hypertension Controlled, no change in medication   Chronic HFrEF (heart failure with reduced ejection fraction) (HCC) STABLE AND CONTROLLED ON CURRENT REGIME

## 2024-08-13 ENCOUNTER — Other Ambulatory Visit: Payer: Self-pay | Admitting: Family Medicine

## 2024-08-15 ENCOUNTER — Telehealth: Payer: Self-pay

## 2024-08-15 MED ORDER — CLONAZEPAM 0.5 MG PO TABS
0.5000 mg | ORAL_TABLET | Freq: Every day | ORAL | 5 refills | Status: AC
Start: 2024-08-23 — End: ?

## 2024-08-15 NOTE — Telephone Encounter (Signed)
 Pt needing refill of Clonazepam  0.5

## 2024-08-15 NOTE — Addendum Note (Signed)
 Addended by: ANTONETTA QUANT E on: 08/15/2024 11:34 AM   Modules accepted: Orders

## 2024-08-15 NOTE — Telephone Encounter (Signed)
 Due 12/02 and refill sent for that pickup date

## 2024-08-16 ENCOUNTER — Other Ambulatory Visit (HOSPITAL_COMMUNITY): Payer: Self-pay | Admitting: Cardiology

## 2024-08-16 DIAGNOSIS — I5022 Chronic systolic (congestive) heart failure: Secondary | ICD-10-CM

## 2024-08-22 ENCOUNTER — Telehealth: Payer: Self-pay | Admitting: Neurology

## 2024-08-22 ENCOUNTER — Ambulatory Visit: Admitting: Neurology

## 2024-08-22 ENCOUNTER — Encounter: Payer: Self-pay | Admitting: Neurology

## 2024-08-22 VITALS — BP 118/70 | HR 65 | Temp 98.2°F | Ht <= 58 in | Wt 149.0 lb

## 2024-08-22 DIAGNOSIS — G40909 Epilepsy, unspecified, not intractable, without status epilepticus: Secondary | ICD-10-CM

## 2024-08-22 DIAGNOSIS — D329 Benign neoplasm of meninges, unspecified: Secondary | ICD-10-CM | POA: Diagnosis not present

## 2024-08-22 NOTE — Progress Notes (Signed)
 [facility]  PATIENT: Tara [name] DOB: June 05, 1946  REQUESTING CLINICIAN: Antonetta Rollene BRAVO, MD HISTORY FROM: Patient and husband  REASON FOR VISIT: Seizure disorder    HISTORICAL  CHIEF COMPLAINT:  Chief Complaint  Patient presents with   Follow-up    States everything has been going pretty good.     INTERVAL HISTORY 08/22/2024 Patient presents today for follow-up, last visit was in May at that time we discontinued lacosamide  and continued patient on levetiracetam  500 mg twice daily.  She tells me that since then she has been doing well, denies any seizure or seizure activity.  Currently she does not have any question or concerns.  Overall she is well no other complaints.   INTERVAL HISTORY 01/21/2024:  Patient presents today for follow-up, last visit was in October, at that time we decreased the Vimpat  to 50 mg twice daily and continue patient on Keppra  500 mg twice daily.  She was doing well, was doing PT, but family tells me that she was having falls and complaining of dizziness.  It seemed that there was a misunderstanding in her Vimpat , she was taking 50 mg twice daily and her husband was giving her extra 50 mg daily.  On April 24, she did have a fall and fractured her left jaw. There are no reports of seizure or seizure like activity. Her tremors are controlled, she is not complaining of worsening tremors except when she was admitted and medicated in the hospital.    INTERVAL HISTORY 07/02/2023 Patient presents today for follow-up, she is accompanied by husband and daughter. Last visit was in February, at that time I have decreased her Keppra  from 1000 to 750 mg twice daily and Vimpat  from 200 to 100 mg twice daily.  Since then she has not had any seizure or seizure like activity.  She was admitted to the hospital twice over the summer due to pneumonia and second time regarding fluid overload and worsening renal function.  At that time her Keppra  was  decreased to 500 mg twice daily.  Again no seizure or seizure activity.  Overall she is doing well. In term of bradykinesia and resting tremor, I obtained a DaTscan  which was negative for parkinsonism.   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  Patient have a long standing history of meningioma, in June she presented with lethargy and was found to have new vasogenic edema in the adjacent frontal lobe.  The meningioma was resected.  Husband stated after resection of the meningioma she had seizures.  She had a total of 3 seizures and they are all described as generalized convulsion.  She is on Vimpat  200 mg twice daily and Keppra  750 mg twice daily but husband reported lethargy with the medicine and also reports shaking.  Husband stated whenever he gave him the medication he noted that patient shakes all over.  Currently he is on a wheelchair she is able to stand but has difficulty with ambulation.She is doing home PT and OT.   Handedness: Right-handed  Onset: In June 2024 after resection of a meningioma  Seizure Type: Described as generalized convulsion  Current frequency: Total of 3 seizures  Any injuries from seizures: Denies  Seizure risk factors: Meningioma s/p resection  Previous ASMs: Levetiracetam  and lacosamide   Currenty ASMs: Levetiracetam  500 mg twice daily and lacosamide  50 mg twice daily  ASMs side effects: Lethargy  Brain Images: Postsurgical change  Previous EEGs: Diffuse slowing   OTHER MEDICAL CONDITIONS: Meningioma s/p resection seizure disorder,  REVIEW OF SYSTEMS: Full 14 system review of systems performed and negative with exception of: As noted in the HPI   ALLERGIES: Allergies  Allergen Reactions   Aspirin  Other (See Comments)    Reports GI bleed-   Lisinopril  Cough   Sudafed [Pseudoephedrine Hcl] Other  (See Comments)    Pt reports she had drainage in throat that made her throat hurt.     HOME MEDICATIONS: Outpatient Medications Prior to Visit  Medication Sig Dispense Refill   acetaminophen  (TYLENOL ) 500 MG tablet Take 1,000 mg by mouth every 6 (six) hours as needed for moderate pain (pain score 4-6) or mild pain (pain score 1-3).     benzonatate  (TESSALON ) 200 MG capsule Take 1 capsule (200 mg total) by mouth 2 (two) times daily as needed for cough. 20 capsule 0   bisacodyl  5 MG EC tablet One tablet every 3 days, if needed, for constipation 30 tablet 1   clonazePAM  (KLONOPIN ) 0.5 MG tablet Take 1 tablet (0.5 mg total) by mouth at bedtime. 30 tablet 5   [START ON 08/23/2024] clonazePAM  (KLONOPIN ) 0.5 MG tablet Take 1 tablet (0.5 mg total) by mouth at bedtime. 30 tablet 5   dapagliflozin  propanediol (FARXIGA ) 10 MG TABS tablet Take 1 tablet (10 mg total) by mouth daily. 30 tablet 11   ELIQUIS  5 MG TABS tablet Take 1 tablet (5 mg total) by mouth 2 (two) times daily. 60 tablet 6   fluticasone  (FLONASE ) 50 MCG/ACT nasal spray Place 2 sprays into both nostrils daily. 48 g 3   furosemide  (LASIX ) 20 MG tablet Take 1 tablet (20 mg total) by mouth daily as needed (for wt gain > 3 lb in 24h or > 5 lb in 1wk). 30 tablet 11   hydrOXYzine  (ATARAX ) 10 MG tablet Take 10 mg by mouth at bedtime as needed for itching.     levETIRAcetam  (KEPPRA ) 500 MG tablet Take 1 tablet (500 mg total) by mouth 2 (two) times daily. 180 tablet 3   montelukast  (SINGULAIR ) 10 MG tablet Take 1 tablet (10 mg total) by mouth at bedtime. 90 tablet 3   montelukast  (SINGULAIR ) 10 MG tablet Take 10 mg by mouth at bedtime.     pantoprazole  (PROTONIX ) 40 MG tablet TAKE ONE TABLET BY MOUTH TWICE DAILY 60 tablet 0   polyethylene glycol (MIRALAX ) 17 g packet Take one packet mira lax once daily in 8 ounces water  28 each 5   potassium chloride  SA (KLOR-CON  M) 20 MEQ tablet Take 1 tablet (20 mEq total) by mouth daily as needed (when take  furosemide ). 30 tablet 11   rosuvastatin  (CRESTOR ) 10 MG tablet Take 1 tablet (10 mg total) by mouth daily. 90 tablet 3   solifenacin  (VESICARE ) 5 MG tablet Take 1 tablet (5 mg total) by mouth daily. 30 tablet 2   spironolactone  (ALDACTONE ) 25 MG tablet Take 0.5 tablets (12.5 mg total) by mouth daily. 45 tablet 3   No facility-administered medications prior to visit.    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   Angio-edema    Anxiety    Breast cancer (HCC) 2008   right - s/p lumpectomy->chemo, radiation   Depression    Dysrhythmia    hx SVT   Family history of colon cancer  Family history of prostate cancer    GERD (gastroesophageal reflux disease)    H/O: hysterectomy    Hiatal hernia    History of cancer chemotherapy    History of radiation therapy    Hyperlipidemia    Hypertension 01/25/2018   Nonischemic cardiomyopathy (HCC)    Personal history of chemotherapy    Personal history of radiation therapy 01/05/209   SVT (supraventricular tachycardia)    short RP SVT documented 5/14   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HCC)    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)   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    PAST SURGICAL HISTORY: Past Surgical History:  Procedure Laterality Date   ABDOMINAL HYSTERECTOMY  1994   fibroids,    BIOPSY  06/19/2016   Procedure: BIOPSY;  Surgeon: Lamar CHRISTELLA Hollingshead, MD;  Location: [facility];  Service: Endoscopy;;  gastric duodenum   BREAST BIOPSY Right 12/16/2006   malignant   BREAST EXCISIONAL BIOPSY Right 2018   benign lumpectomy   BREAST LUMPECTOMY Right 02/2007   BREAST LUMPECTOMY WITH RADIOACTIVE SEED LOCALIZATION Right 07/29/2017   Procedure: RIGHT BREAST LUMPECTOMY WITH RADIOACTIVE SEED LOCALIZATION;  Surgeon: Gail Favorite, MD;  Location: [facility] OR;  Service: General;  Laterality:  Right;   BREAST SURGERY Right 2008   lumpectomy, cancer   CARDIAC CATHETERIZATION     CARDIOVERSION N/A 08/06/2023   Procedure: CARDIOVERSION (CATH LAB);  Surgeon: Rolan Ezra RAMAN, MD;  Location: [facility] INVASIVE CV LAB;  Service: Cardiovascular;  Laterality: N/A;   CHOLECYSTECTOMY  1999   COLONOSCOPY  2008   Dr. Harvey: multiple polyps. Path not available at time of visit.    COLONOSCOPY WITH PROPOFOL  N/A 04/25/2014   Dr. harvey: Multiple tubular adenomas removed. Diverticulosis. Moderate internal hemorrhoids. Next colonoscopy planned for August 2018.   CRANIOTOMY Bilateral 03/24/2022   Procedure: Bifrontal craniotomy for resection of meningioma;  Surgeon: Gillie Duncans, MD;  Location: [facility] OR;  Service: Neurosurgery;  Laterality: Bilateral;   ESOPHAGOGASTRODUODENOSCOPY (EGD) WITH PROPOFOL  N/A 06/19/2016   Procedure: ESOPHAGOGASTRODUODENOSCOPY (EGD) WITH PROPOFOL ;  Surgeon: Lamar CHRISTELLA Hollingshead, MD;  Location: [facility];  Service: Endoscopy;  Laterality: N/A;   MASTECTOMY Right 2019   MASTECTOMY W/ SENTINEL NODE BIOPSY Right 06/09/2018   Procedure: RIGHT TOTAL MASTECTOMY WITH SENTINEL LYMPH NODE BIOPSY;  Surgeon: Gail Favorite, MD;  Location: [facility] OR;  Service: General;  Laterality: Right;   POLYPECTOMY N/A 04/25/2014   Procedure: POLYPECTOMY;  Surgeon: Margo LITTIE Harvey, MD;  Location: [facility];  Service: Endoscopy;  Laterality: N/A;  Ascending and Decending Colon x3 , Transverse colon x2, rectal   PORTACATH PLACEMENT N/A 06/09/2018   Procedure: INSERTION PORT-A-CATH;  Surgeon: Gail Favorite, MD;  Location: [facility] OR;  Service: General;  Laterality: N/A;   RIGHT/LEFT HEART CATH AND CORONARY ANGIOGRAPHY N/A 07/10/2017   Procedure: RIGHT/LEFT HEART CATH AND CORONARY ANGIOGRAPHY;  Surgeon: Rolan Ezra RAMAN, MD;  Location: [facility] INVASIVE CV LAB;  Service: Cardiovascular;  Laterality: N/A;    FAMILY HISTORY: Family History  Problem Relation Age of Onset   Dementia Mother    Stroke Mother 3       left  hemiparesis   Colon cancer Father 33       died at age 78   Hypertension Sister    Heart disease Sister    Cancer Sister        unknown form   Cancer Paternal Aunt  NOS   Other Paternal Aunt        old age   Lung cancer Paternal Uncle    Other Paternal Uncle        lightning strike   Other Paternal Uncle        hit by train   Cancer Cousin        NOS pat first cousin   Cancer Cousin        NOS pat first cousin   Prostate cancer Cousin        pat first cousin   Prostate cancer Brother 63    SOCIAL HISTORY: Social History   Socioeconomic History   Marital status: Married    Spouse name: Not on file   Number of children: 2   Years of education: Not on file   Highest education level: Not on file  Occupational History   Occupation: disabled     Employer: UNEMPLOYED  Tobacco Use   Smoking status: Never   Smokeless tobacco: Never  Vaping Use   Vaping status: Never Used  Substance and Sexual Activity   Alcohol use: No   Drug use: No   Sexual activity: Not on file  Other Topics Concern   Not on file  Social History Narrative   Lives in [place] with family.  Does not routinely exercise. Right handed   Caffeine-none   Social Drivers of Health   Financial Resource Strain: Low Risk  (02/23/2024)   Overall Financial Resource Strain (CARDIA)    Difficulty of Paying Living Expenses: Not hard at all  Food Insecurity: No Food Insecurity (02/23/2024)   Hunger Vital Sign    Worried About Running Out of Food in the Last Year: Never true    Ran Out of Food in the Last Year: Never true  Transportation Needs: No Transportation Needs (02/23/2024)   PRAPARE - Administrator, Civil Service (Medical): No    Lack of Transportation (Non-Medical): No  Physical Activity: Insufficiently Active (02/23/2024)   Exercise Vital Sign    Days of Exercise per Week: 3 days    Minutes of Exercise per Session: 30 min  Stress: No Stress Concern Present (02/23/2024)   [employer] - Occupational Stress Questionnaire    Feeling of Stress : Not at all  Social Connections: Moderately Integrated (02/23/2024)   Social Connection and Isolation Panel    Frequency of Communication with Friends and Family: More than three times a week    Frequency of Social Gatherings with Friends and Family: More than three times a week    Attends Religious Services: More than 4 times per year    Active Member of [employer] or Organizations: No    Attends Banker Meetings: Never    Marital Status: Married  Catering Manager Violence: Not At Risk (02/23/2024)   Humiliation, Afraid, Rape, and Kick questionnaire    Fear of Current or Ex-Partner: No    Emotionally Abused: No    Physically Abused: No    Sexually Abused: No    PHYSICAL EXAM  GENERAL EXAM/CONSTITUTIONAL: Vitals:  Vitals:   08/22/24 1431  BP: 118/70  Pulse: 65  Temp: 98.2 F (36.8 C)  TempSrc: Oral  SpO2: 95%  Weight: 149 lb (67.6 kg)  Height: 4' 9 (1.448 m)   Body mass index is 32.24 kg/m. Wt Readings from Last 3 Encounters:  08/22/24 149 lb (67.6 kg)  07/15/24 152 lb 3.2 oz (69 kg)  05/11/24 144 lb  6.4 oz (65.5 kg)   Patient is in no distress; well developed, nourished and groomed; neck is supple, sitting in her wheelchair  MUSCULOSKELETAL: Gait, strength, tone, movements noted in Neurologic exam below  NEUROLOGIC: MENTAL STATUS:     05/01/2021   11:27 AM  MMSE - Mini Mental State Exam  Not completed: Unable to complete   awake, alert, oriented to person, place and time recent and remote memory intact normal attention and concentration language fluent, comprehension intact, naming intact fund of knowledge appropriate  CRANIAL NERVE:  2nd, 3rd, 4th, 6th - Visual fields full to confrontation, extraocular muscles intact, no nystagmus 5th - facial sensation symmetric 7th - facial strength symmetric 8th - hearing intact 9th - palate elevates symmetrically, uvula  midline 11th - shoulder shrug symmetric 12th - tongue protrusion midline  MOTOR:  normal bulk and tone, full strength in the BUE, BLE.   SENSORY:  normal and symmetric to light touch  COORDINATION:  finger-nose-finger, fine finger movements normal. No resting tremors appreciated today.   GAIT/STATION:  Normal    DIAGNOSTIC DATA (LABS, IMAGING, TESTING) - I reviewed patient records, labs, notes, testing and imaging myself where available.  Lab Results  Component Value Date   WBC 10.0 07/15/2024   HGB 12.2 07/15/2024   HCT 38.6 07/15/2024   MCV 82.1 07/15/2024   PLT 189 07/15/2024      Component Value Date/Time   NA 137 07/26/2024 1100   NA 142 04/20/2024 1123   NA 143 10/12/2014 1001   K 5.3 (H) 07/26/2024 1100   K 3.9 10/12/2014 1001   CL 105 07/26/2024 1100   CO2 18 (L) 07/26/2024 1100   CO2 25 10/12/2014 1001   GLUCOSE 78 07/26/2024 1100   GLUCOSE 105 10/12/2014 1001   BUN 43 (H) 07/26/2024 1100   BUN 39 (H) 04/20/2024 1123   BUN 13.9 10/12/2014 1001   CREATININE 1.50 (H) 07/26/2024 1100   CREATININE 1.90 (H) 07/17/2023 1356   CREATININE 1.0 10/12/2014 1001   CALCIUM  9.0 07/26/2024 1100   CALCIUM  8.6 10/12/2014 1001   PROT 7.3 03/02/2024 1152   PROT 6.8 01/27/2024 1033   PROT 7.4 10/12/2014 1001   ALBUMIN  3.4 (L) 03/02/2024 1152   ALBUMIN  3.6 (L) 01/27/2024 1033   ALBUMIN  3.5 10/12/2014 1001   AST 44 (H) 03/02/2024 1152   AST 10 (L) 11/12/2018 1044   AST 33 10/12/2014 1001   ALT 37 03/02/2024 1152   ALT 6 11/12/2018 1044   ALT 27 10/12/2014 1001   ALKPHOS 94 03/02/2024 1152   ALKPHOS 102 10/12/2014 1001   BILITOT 0.5 03/02/2024 1152   BILITOT 0.3 01/27/2024 1033   BILITOT <0.2 (L) 11/12/2018 1044   BILITOT 0.25 10/12/2014 1001   GFRNONAA 35 (L) 07/26/2024 1100   GFRNONAA 35 (L) 03/22/2020 1358   GFRAA 43 (L) 08/07/2020 1013   GFRAA 40 (L) 03/22/2020 1358   Lab Results  Component Value Date   CHOL 184 03/02/2024   HDL 67 03/02/2024   LDLCALC  105 (H) 03/02/2024   TRIG 60 03/02/2024   Lab Results  Component Value Date   HGBA1C 6.2 (H) 06/03/2023   Lab Results  Component Value Date   VITAMINB12 356 04/11/2022   Lab Results  Component Value Date   TSH 3.170 07/17/2023    EEG 05/15/22 This study is suggestive moderate diffuse encephalopathy, nonspecific etiology.  No seizures or epileptiform discharges were seen throughout the recording.    MRI Brain 10/17/2022 1. Postsurgical  changes reflecting bifrontal craniotomy for planum sphenoidale meningioma resection with no enhancement at the site of the mass to suggest residual or locally recurrent tumor. 2. Diffuse pachymeningeal thickening and enhancement over both cerebral hemispheres, more prominent over the frontal lobes at the midline measuring up to 7 mm in thickness, favored to reflect evolving postoperative change. Recommend follow-up in 3-6 months to ensure stability.   MRI Brain 03/15/2022 Long-standing planum sphenoidale meningioma with slow growth. Current dimensions are 3 x 3 x 2 cm. Possibly related to the history of seizure, there is new vasogenic edema in the adjacent left frontal lobe.  DaTscan  12/04/2022 Ioflupane scan within normal limits. No reduced radiotracer activity in basal ganglia to suggest Parkinson's syndrome pathology.   ASSESSMENT AND PLAN  78 y.o. year old female  with meningioma s/p resection in June 2024, seizure disorder, heart disease who is presenting for seizure follow-up.  Currently she is on Keppra  500 mg twice daily, doing well, denies any seizure or seizure like activity.  Again her last seizure was in June 2024 following her meningioma surgery.  Plan for patient is to continue Keppra  for another year and if no seizures, we will further decrease it to 250 mg twice daily.  Patient understands that she will remain on Keppra  lifelong.  I will see her in 1 year for follow-up or sooner if worse.   1. Seizure disorder (HCC)   2. Meningioma  [facility])      Patient Instructions  Continue with Keppra  500 mg twice daily At next visit in a year, if no additional seizures, we will further decrease Keppra  to 250 mg twice daily Continue your other medications Continue follow-up with your doctors Return in 1 year or sooner if worse   Per [place]  DMV statutes, patients with seizures are not allowed to drive until they have been seizure-free for six months.  Other recommendations include using caution when using heavy equipment or power tools. Avoid working on ladders or at heights. Take showers instead of baths.  Do not swim alone.  Ensure the water  temperature is not too high on the home water  heater. Do not go swimming alone. Do not lock yourself in a room alone (i.e. bathroom). When caring for infants or small children, sit down when holding, feeding, or changing them to minimize risk of injury to the child in the event you have a seizure. Maintain good sleep hygiene. Avoid alcohol.  Also recommend adequate sleep, hydration, good diet and minimize stress.   During the Seizure  - First, ensure adequate ventilation and place patients on the floor on their left side  Loosen clothing around the neck and ensure the airway is patent. If the patient is clenching the teeth, do not force the mouth open with any object as this can cause severe damage - Remove all items from the surrounding that can be hazardous. The patient may be oblivious to what's happening and may not even know what he or she is doing. If the patient is confused and wandering, either gently guide him/her away and block access to outside areas - Reassure the individual and be comforting - Call 911. In most cases, the seizure ends before EMS arrives. However, there are cases when seizures may last over 3 to 5 minutes. Or the individual may have developed breathing difficulties or severe injuries. If a pregnant patient or a person with diabetes develops a seizure, it is  prudent to call an ambulance. - Finally, if the patient does not  regain full consciousness, then call EMS. Most patients will remain confused for about 45 to 90 minutes after a seizure, so you must use judgment in calling for help. - Avoid restraints but make sure the patient is in a bed with padded side rails - Place the individual in a lateral position with the neck slightly flexed; this will help the saliva drain from the mouth and prevent the tongue from falling backward - Remove all nearby furniture and other hazards from the area - Provide verbal assurance as the individual is regaining consciousness - Provide the patient with privacy if possible - Call for help and start treatment as ordered by the caregiver   After the Seizure (Postictal Stage)  After a seizure, most patients experience confusion, fatigue, muscle pain and/or a headache. Thus, one should permit the individual to sleep. For the next few days, reassurance is essential. Being calm and helping reorient the person is also of importance.  Most seizures are painless and end spontaneously. Seizures are not harmful to others but can lead to complications such as stress on the lungs, brain and the heart. Individuals with prior lung problems may develop labored breathing and respiratory distress.     No orders of the defined types were placed in this encounter.   No orders of the defined types were placed in this encounter.   Return in about 1 year (around 08/22/2025).    Pastor Falling, MD 08/22/2024, 4:43 PM  [facility] [address], [address] [place], [place] [address] [phone]

## 2024-08-22 NOTE — Telephone Encounter (Signed)
 Appointment details confirmed

## 2024-08-22 NOTE — Patient Instructions (Addendum)
 Continue with Keppra  500 mg twice daily At next visit in a year, if no additional seizures, we will further decrease Keppra  to 250 mg twice daily Continue your other medications Continue follow-up with your doctors Return in 1 year or sooner if worse

## 2024-08-26 ENCOUNTER — Encounter: Payer: Self-pay | Admitting: Family Medicine

## 2024-08-26 ENCOUNTER — Ambulatory Visit: Admitting: Family Medicine

## 2024-08-26 VITALS — BP 112/73 | HR 66 | Resp 16 | Ht <= 58 in | Wt 147.1 lb

## 2024-08-26 DIAGNOSIS — Z0001 Encounter for general adult medical examination with abnormal findings: Secondary | ICD-10-CM

## 2024-08-26 DIAGNOSIS — Z23 Encounter for immunization: Secondary | ICD-10-CM | POA: Diagnosis not present

## 2024-08-26 DIAGNOSIS — N1831 Chronic kidney disease, stage 3a: Secondary | ICD-10-CM | POA: Diagnosis not present

## 2024-08-26 DIAGNOSIS — E785 Hyperlipidemia, unspecified: Secondary | ICD-10-CM | POA: Diagnosis not present

## 2024-08-26 DIAGNOSIS — E559 Vitamin D deficiency, unspecified: Secondary | ICD-10-CM

## 2024-08-26 DIAGNOSIS — M8580 Other specified disorders of bone density and structure, unspecified site: Secondary | ICD-10-CM

## 2024-08-26 DIAGNOSIS — I10 Essential (primary) hypertension: Secondary | ICD-10-CM | POA: Diagnosis not present

## 2024-08-26 MED ORDER — DEXTROMETHORPHAN HBR 15 MG/5ML PO SYRP: ORAL_SOLUTION | ORAL | 1 refills | Status: AC

## 2024-08-26 NOTE — Patient Instructions (Addendum)
 F/u in 4. 5 months   Flu vaccine today   Need covid vaccine  and shingrix vaccine #2 these are both at your pharmacy  Nurse pls enter negative cologuard test 04/18/2024  Dextromethorphan  is sent in for as needed use for night time cough   Lipid. Cmp and eGfr , tSH and vit D today  Thanks for choosing [facility], we consider it a privelige to serve you.

## 2024-08-27 LAB — LIPID PANEL
Chol/HDL Ratio: 2.3 ratio (ref 0.0–4.4)
Cholesterol, Total: 163 mg/dL (ref 100–199)
HDL: 70 mg/dL (ref >39)
LDL Chol Calc (NIH): 81 mg/dL (ref 0–99)
Triglycerides: 61 mg/dL (ref 0–149)
VLDL Cholesterol Cal: 12 mg/dL (ref 5–40)

## 2024-08-27 LAB — CMP14+EGFR
ALT: 12 IU/L (ref 0–32)
AST: 19 IU/L (ref 0–40)
Albumin: 3.6 g/dL — ABNORMAL LOW (ref 3.8–4.8)
Alkaline Phosphatase: 89 IU/L (ref 49–135)
BUN/Creatinine Ratio: 23 (ref 12–28)
BUN: 30 mg/dL — ABNORMAL HIGH (ref 8–27)
Bilirubin Total: 0.4 mg/dL (ref 0.0–1.2)
CO2: 19 mmol/L — ABNORMAL LOW (ref 20–29)
Calcium: 9.2 mg/dL (ref 8.7–10.3)
Chloride: 108 mmol/L — ABNORMAL HIGH (ref 96–106)
Creatinine, Ser: 1.3 mg/dL — ABNORMAL HIGH (ref 0.57–1.00)
Globulin, Total: 3 g/dL (ref 1.5–4.5)
Glucose: 77 mg/dL (ref 70–99)
Potassium: 4.7 mmol/L (ref 3.5–5.2)
Sodium: 142 mmol/L (ref 134–144)
Total Protein: 6.6 g/dL (ref 6.0–8.5)
eGFR: 42 mL/min/1.73 — ABNORMAL LOW (ref >59)

## 2024-08-27 LAB — VITAMIN D 25 HYDROXY (VIT D DEFICIENCY, FRACTURES): Vit D, 25-Hydroxy: 36.3 ng/mL (ref 30.0–100.0)

## 2024-08-27 LAB — TSH: TSH: 2.24 u[IU]/mL (ref 0.450–4.500)

## 2024-08-28 ENCOUNTER — Encounter: Payer: Self-pay | Admitting: Family Medicine

## 2024-08-28 ENCOUNTER — Ambulatory Visit: Payer: Self-pay | Admitting: Family Medicine

## 2024-08-28 DIAGNOSIS — Z23 Encounter for immunization: Secondary | ICD-10-CM | POA: Insufficient documentation

## 2024-08-28 MED ORDER — PANTOPRAZOLE SODIUM 40 MG PO TBEC: 40.0000 mg | DELAYED_RELEASE_TABLET | Freq: Two times a day (BID) | ORAL | 5 refills | Status: AC

## 2024-08-28 NOTE — Addendum Note (Signed)
 Addended by: ANTONETTA ROLLENE BRAVO on: 08/28/2024 07:05 AM   Modules accepted: Orders

## 2024-08-28 NOTE — Assessment & Plan Note (Signed)
 Annual exam as documented. . Immunization and cancer screening needs are specifically addressed at this visit.

## 2024-08-28 NOTE — Progress Notes (Signed)
    BRENDALY TOWNSEL     MRN: [id number]      DOB: 1945/12/24  Chief Complaint  Patient presents with   Annual Exam    Cpe    Cough    Pt still having cough wanting refill of cough syrup     HPI: Patient is in for annual physical exam.  Recent labs,  are reviewed. Immunization is reviewed , and  updated   PE: BP 112/73   Pulse 66   Resp 16   Ht 4' 9 (1.448 m)   Wt 147 lb 1.9 oz (66.7 kg)   SpO2 91%   BMI 31.84 kg/m   Pleasant  female, alert and oriented x 3, in no cardio-pulmonary distress. Afebrile. HEENT No facial trauma or asymetry. Sinuses non tender.  Extra occullar muscles intact.. External ears normal, . Neck: supple, no adenopathy,JVD or thyromegaly.No bruits.  Chest: Clear to ascultation bilaterally.No crackles or wheezes. Non tender to palpation  Breast: Mammogram UTD and normal,  not examined, asymptomatic  Cardiovascular system; Heart sounds normal,  S1 and  S2 ,no S3.  No murmur, or thrill. Apical beat not displaced Peripheral pulses normal.  Abdomen: Soft, non tender, no organomegaly or masses. No bruits. Bowel sounds normal. No guarding, tenderness or rebound.    Musculoskeletal exam: Decreased though adequate  ROM of spine, hips , shoulders and knees. No deformity ,swelling or crepitus noted. No muscle wasting or atrophy.   Neurologic: Cranial nerves 2 to 12 intact. Power, tone ,sensation  normal throughout.  disturbance in gait. No tremor.Ambulates with a walker  Skin: Intact, no ulceration, erythema , scaling or rash noted. Pigmentation normal throughout  Psych; Normal mood and affect. Judgement and concentration normal   Assessment & Plan:  Encounter for Medicare annual examination with abnormal findings Annual exam as documented. Immunization and cancer screening needs are specifically addressed at this visit.   Influenza vaccination administered at current visit After obtaining informed consent, the influenza  vaccine  is  administered , with no adverse effect noted at the time of administration.

## 2024-08-28 NOTE — Assessment & Plan Note (Signed)
 After obtaining informed consent, the influenza vaccine is  administered , with no adverse effect noted at the time of administration.

## 2024-09-02 ENCOUNTER — Ambulatory Visit (HOSPITAL_COMMUNITY): Payer: Self-pay | Admitting: Cardiology

## 2024-09-02 ENCOUNTER — Other Ambulatory Visit (HOSPITAL_COMMUNITY)
Admission: RE | Admit: 2024-09-02 | Discharge: 2024-09-02 | Disposition: A | Source: Ambulatory Visit | Attending: Cardiology | Admitting: Cardiology

## 2024-09-02 DIAGNOSIS — I5022 Chronic systolic (congestive) heart failure: Secondary | ICD-10-CM

## 2024-09-02 LAB — HEPATIC FUNCTION PANEL
ALT: 12 U/L (ref 0–44)
AST: 22 U/L (ref 15–41)
Albumin: 3.8 g/dL (ref 3.5–5.0)
Alkaline Phosphatase: 100 U/L (ref 38–126)
Bilirubin, Direct: 0.1 mg/dL (ref 0.0–0.2)
Indirect Bilirubin: 0.2 mg/dL — ABNORMAL LOW (ref 0.3–0.9)
Total Bilirubin: 0.3 mg/dL (ref 0.0–1.2)
Total Protein: 7.2 g/dL (ref 6.5–8.1)

## 2024-09-02 LAB — BASIC METABOLIC PANEL WITH GFR
Anion gap: 12 (ref 5–15)
BUN: 30 mg/dL — ABNORMAL HIGH (ref 8–23)
CO2: 21 mmol/L — ABNORMAL LOW (ref 22–32)
Calcium: 8.7 mg/dL — ABNORMAL LOW (ref 8.9–10.3)
Chloride: 109 mmol/L (ref 98–111)
Creatinine, Ser: 1.45 mg/dL — ABNORMAL HIGH (ref 0.44–1.00)
GFR, Estimated: 37 mL/min — ABNORMAL LOW (ref >60)
Glucose, Bld: 137 mg/dL — ABNORMAL HIGH (ref 70–99)
Potassium: 4.2 mmol/L (ref 3.5–5.1)
Sodium: 142 mmol/L (ref 135–145)

## 2024-09-23 ENCOUNTER — Other Ambulatory Visit: Payer: Self-pay | Admitting: Family Medicine

## 2024-09-23 DIAGNOSIS — R32 Unspecified urinary incontinence: Secondary | ICD-10-CM

## 2024-09-27 ENCOUNTER — Other Ambulatory Visit (HOSPITAL_COMMUNITY): Payer: Self-pay | Admitting: Cardiology

## 2024-09-28 ENCOUNTER — Other Ambulatory Visit (HOSPITAL_COMMUNITY): Payer: Self-pay | Admitting: Cardiology

## 2024-09-30 ENCOUNTER — Other Ambulatory Visit: Payer: Self-pay | Admitting: Family Medicine

## 2024-09-30 DIAGNOSIS — Z1231 Encounter for screening mammogram for malignant neoplasm of breast: Secondary | ICD-10-CM

## 2024-10-12 NOTE — Progress Notes (Signed)
 "  ADVANCED HF CLINIC NOTE  PCP: Antonetta Rollene BRAVO, MD HF Cardiology: Dr. Rolan  79 y.o. AAF with chronic systolic HF, hx of SVT, CKD stage 3 and paroxysmal atrial fibrillation.  Dr. Kelsie with EP initially followed her for a short RP SVT.    Patient was initially found to have low EF in 2014. Echo 6/17 showed EF 35-40%. Cardiolite in 8/17 showed fixed apical defect but no evidence for ischemia. She was admitted in 1/18 with PNA, DVT, and CHF exacerbation.  Echo at that time showed EF down to 20-25%.  She was diuresed and discharged.   Patient was diagnosed with breast cancer in 2008 and had lumpectomy, radiation, and chemotherapy which included epirubicin, and anthracycline.  Recurrent cancer in 2019 with right mastectomy and chemo with cyclophosphamide , MTX, and fluorouracil .   RHC/LHC 10/18: no significant CAD and mildly elevate PCWP.  Cardiac MRI 11/18: EF 49%, no LGE.   Echo 5/21: EF 45%, GHK, mildly decreased RV systolic function.  Echo 7/22: EF 55% with normal RV and IVC.    Patient was in atrial fibrillation with RVR in 6/23, converted back to NSR with amiodarone .   Echo 7/23: EF 40-45%, D-shaped septum, moderate RV dysfunction with mild RV enlargement, IVC normal.   9/24 hospitalized for aspiration PNA  Follow up 10/24, in atrial fibrillation and mildly volume overloaded. Lasix  increased to 40 daily, Farxiga  started and arranged for repeat echo and DCCV.   Echo 11/24 EF 30-35%, RV mildly reduced.  DCCV 08/06/23 to NSR   Echo in 3/25 showed EF 40%, normal RV, IVC normal  Echo 4/25 EF 50-55%, normal RV   Admitted 4/25 post fall on eliquis . Found to be hypotensive and bradycardic on arrival 2/2 polypharmacy. Fall resultant with SDH and L mandible fx. Briefly required dopamine . Echo EF 55%. Neurosurgery ok with Eliquis  at discharge. Discharged with Zio. Amio/Farxiga  and spiro held at discharge. Started on midodrine .   Zio 5/25 showed mostly NSR, no worrisome arrhythmias,  mild bradycardia with average HR 57 bpm.  Today she returns for HF follow up with her husband. Overall feeling fine. [place], with no undue dyspnea. Denies  palpitations, abnormal bleeding, CP, dizziness, edema, or PND/Orthopnea. Appetite ok. Weight at home 150 pounds. Taking all medications. Has not needed Lasix .  ECG (personally reviewed): none ordered today.  Labs (3/25): LDL 169 Labs (5/25): K 4.6, creatinine 1.51, AST 56, ALT 56 Labs (6/25): K 4.4, creatinine 1.26, LFTs trending down, LDL 105 Labs (8/25): K 4.3, creatinine 1.72, BNP 40 Labs (12/25): K 4.2, creatinine 1.45, LDL 81   PMH: 1. SVT: Short R-P SVT, followed by Dr. Kelsie.  2. DVT in 1/18 in setting of PNA.  She was on Xarelto  for about 6 months.  3. Breast cancer: Diagnosed on right in 2008.  She had lumpectomy, radiation, and chemotherapy.  She had fluorouracil , epirubicin, Cytoxan .  - Recent mammogram with right breast calcifications => biopsy with complex sclerosing lesion.  - Recurrent cancer 2019, she had right mastectomy and chemotherapy with cyclophosphamide , MTX, fluorouracil .  4. GERD 5. Hyperlipidemia 6. Chronic systolic CHF: Uncertain cause of cardiomyopathy.  Echo in 2014 showed EF 40%.  It is possible that it is related to epirubicin use (anthracycline).  - Echo (6/17): EF 35-40%, mild LV dilation.  - Cardiolite (8/17): no ischemia, apical lateral/apical fixed perfusion defect.  - Echo (1/18): EF 20-25%, moderate MR, severe TR, D-shaped IV septum, PASP 35 mmHg.  -  RHC/LHC (10/18): No angiographic CAD.  Mean RA 5, PA 44/11 mean 26, mean PCWP 18, CI 3.49.  - Cardiac MRI (11/18): EF 49%, diffuse mild hypokinesis, mild RV dilation with normal function, no LGE.  - Echo (5/21): EF 45%, global hypokinesis, mildly decreased RV systolic function.  - Echo (7/22): EF 55% with normal RV and IVC. - Echo (7/23): EF 40-45%, D-shaped septum, moderate RV dysfunction with mild RV  enlargement, IVC normal.  - Echo (11/24): EF 30-35%, RV systolic function reduced, moderate MR and moderate to severe TR - Echo (3/25): EF 40%, normal RV, IVC normal - Echo (4/25): EF 50-55%, normal RV  7. Atrial fibrillation: First noted in 6/23. S/p DCCV 11/24. - Zio (5/25): mostly NSR, no worrisome arrhythmias, mild bradycardia with average HR 57 bpm. 8. Meningioma: Resection in 6/23.  9. Aspiration PNA x 3 episodes. 10. CKD stage 3  FH: Colon cancer.  No known heart disease.   SH: Married, never smoked.  Lives in [place].  Retired engineer, civil (consulting).    ROS: All systems reviewed and negative except as per HPI.   Current Outpatient Medications  Medication Sig Dispense Refill   acetaminophen  (TYLENOL ) 500 MG tablet Take 1,000 mg by mouth every 6 (six) hours as needed for moderate pain (pain score 4-6) or mild pain (pain score 1-3).     bisacodyl  5 MG EC tablet One tablet every 3 days, if needed, for constipation 30 tablet 1   clonazePAM  (KLONOPIN ) 0.5 MG tablet Take 1 tablet (0.5 mg total) by mouth at bedtime. 30 tablet 5   dapagliflozin  propanediol (FARXIGA ) 10 MG TABS tablet Take 1 tablet (10 mg total) by mouth daily. 90 tablet 3   dextromethorphan  15 MG/5ML syrup Take one teaspoon at bedtime as needed, for excessive cough 120 mL 1   ELIQUIS  5 MG TABS tablet Take 1 tablet (5 mg total) by mouth 2 (two) times daily. 60 tablet 6   fluticasone  (FLONASE ) 50 MCG/ACT nasal spray Place 2 sprays into both nostrils daily. 48 g 3   furosemide  (LASIX ) 20 MG tablet Take 1 tablet (20 mg total) by mouth daily as needed (for wt gain > 3 lb in 24h or > 5 lb in 1wk). 30 tablet 11   hydrOXYzine  (ATARAX ) 10 MG tablet Take 10 mg by mouth at bedtime as needed for itching.     levETIRAcetam  (KEPPRA ) 500 MG tablet Take 1 tablet (500 mg total) by mouth 2 (two) times daily. 180 tablet 3   montelukast  (SINGULAIR ) 10 MG tablet Take 1 tablet (10 mg total) by mouth at bedtime. 90 tablet 3   pantoprazole  (PROTONIX ) 40 MG  tablet Take 1 tablet (40 mg total) by mouth 2 (two) times daily. 60 tablet 5   polyethylene glycol (MIRALAX ) 17 g packet Take one packet mira lax once daily in 8 ounces water  28 each 5   potassium chloride  SA (KLOR-CON  M) 20 MEQ tablet Take 1 tablet (20 mEq total) by mouth daily as needed (when take furosemide ). 30 tablet 11   rosuvastatin  (CRESTOR ) 10 MG tablet Take 1 tablet (10 mg total) by mouth daily. 90 tablet 3   solifenacin  (VESICARE ) 5 MG tablet Take 1 tablet (5 mg total) by mouth daily. 30 tablet 2   spironolactone  (ALDACTONE ) 25 MG tablet Take 0.5 tablets (12.5 mg total) by mouth daily. 45 tablet 3   No current facility-administered medications for this encounter.   Wt Readings from Last 3 Encounters:  10/14/24 69 kg (152 lb 3.2 oz)  08/26/24 66.7 kg (147 lb 1.9 oz)  08/22/24 67.6 kg (149 lb)   BP 110/62   Pulse 60   Wt 69 kg (152 lb 3.2 oz)   SpO2 91%   BMI 32.94 kg/m  Physical Exam: General:  NAD. No resp difficulty, walked into clinic, elderly HEENT: Normal Neck: Supple. No JVD. Cor: Regular rate & rhythm. No rubs, gallops or murmurs. Lungs: Clear Abdomen: Soft, nontender, nondistended.  Extremities: No cyanosis, clubbing, rash, edema Neuro: Alert & oriented x 3, moves all 4 extremities w/o difficulty. Affect pleasant.  Assessment/Plan: 1. Chronic systolic CHF: Long-standing cardiomyopathy, since at least 2014.  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  Could be due to epirubicin exposure with 2008 breast cancer treatment.  Cardiac MRI 11/18 with EF up to 49% with no LGE.  Echo 5/21 with EF 45% range.  Echo 7/22 with EF 55%.  Echo 7/23 showed EF 40-45%, D-shaped septum, mod RV dysfunction with mild RV enlargement, IVC normal.  Back in atrial fibrillation 10/24, echo 11/24 with EF 30-35%. ? If reduction in EF was due to atrial fibrillation/RVR. Once back in NSR, echo 3/25 showed EF about 40%. Echo in 4/25 showed EF 50-55%.  NYHA class I-II symptoms.  She is  not volume overloaded on exam.  GDMT has been limited by low BP.  - Continue spironolactone  12.5 mg daily.  - Continue Farxiga  10 mg daily.  BMET today. - Continue Lasix  20 mg daily PRN.  - Update echo next visit 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   - Off amiodarone  3. CKD stage 3: Baseline SCr around 1.7.  - Continue SGLT2i. BMET today.  4. Atrial fibrillation: Paroxysmal.  S/p DC-CV 11/24. Zio 5/25 showed mostly NSR, no worrisome arrhythmias. Regular on exam today. - Continue Eliquis  5 mg bid. No bleeding issues. 5. Hyperlipidemia: Continue Crestor .   Follow up in 6 months with Dr. Rolan + echo  [name]  10/14/2024  "

## 2024-10-13 ENCOUNTER — Telehealth (HOSPITAL_COMMUNITY): Payer: Self-pay

## 2024-10-13 NOTE — Telephone Encounter (Signed)
 Called and spoke to pt's daughter Vernell to confirm/remind patient of their appointment at the [facility] on 10/14/24.   Appointment:   [x] Confirmed  [] Left mess   [] No answer/No voice mail  [] VM Full/unable to leave message  [] Phone not in service  Patient reminded to bring all medications and/or complete list.  Confirmed patient has transportation. Gave directions, instructed to utilize valet parking.

## 2024-10-14 ENCOUNTER — Encounter (HOSPITAL_COMMUNITY): Payer: Self-pay

## 2024-10-14 ENCOUNTER — Ambulatory Visit (HOSPITAL_COMMUNITY): Payer: Self-pay | Admitting: Family Medicine

## 2024-10-14 ENCOUNTER — Other Ambulatory Visit (HOSPITAL_COMMUNITY): Payer: Self-pay

## 2024-10-14 ENCOUNTER — Ambulatory Visit (HOSPITAL_COMMUNITY)
Admission: RE | Admit: 2024-10-14 | Discharge: 2024-10-14 | Disposition: A | Source: Ambulatory Visit | Attending: Family Medicine

## 2024-10-14 VITALS — BP 110/62 | HR 60 | Wt 152.2 lb

## 2024-10-14 DIAGNOSIS — Z9221 Personal history of antineoplastic chemotherapy: Secondary | ICD-10-CM | POA: Insufficient documentation

## 2024-10-14 DIAGNOSIS — I5022 Chronic systolic (congestive) heart failure: Secondary | ICD-10-CM | POA: Insufficient documentation

## 2024-10-14 DIAGNOSIS — N1832 Chronic kidney disease, stage 3b: Secondary | ICD-10-CM | POA: Diagnosis not present

## 2024-10-14 DIAGNOSIS — N183 Chronic kidney disease, stage 3 unspecified: Secondary | ICD-10-CM | POA: Diagnosis not present

## 2024-10-14 DIAGNOSIS — Z7901 Long term (current) use of anticoagulants: Secondary | ICD-10-CM | POA: Insufficient documentation

## 2024-10-14 DIAGNOSIS — I959 Hypotension, unspecified: Secondary | ICD-10-CM | POA: Insufficient documentation

## 2024-10-14 DIAGNOSIS — I480 Paroxysmal atrial fibrillation: Secondary | ICD-10-CM | POA: Diagnosis not present

## 2024-10-14 DIAGNOSIS — E785 Hyperlipidemia, unspecified: Secondary | ICD-10-CM | POA: Insufficient documentation

## 2024-10-14 DIAGNOSIS — Z853 Personal history of malignant neoplasm of breast: Secondary | ICD-10-CM | POA: Insufficient documentation

## 2024-10-14 DIAGNOSIS — I428 Other cardiomyopathies: Secondary | ICD-10-CM | POA: Insufficient documentation

## 2024-10-14 DIAGNOSIS — Z79899 Other long term (current) drug therapy: Secondary | ICD-10-CM | POA: Insufficient documentation

## 2024-10-14 LAB — BASIC METABOLIC PANEL WITH GFR
Anion gap: 10 (ref 5–15)
BUN: 30 mg/dL — ABNORMAL HIGH (ref 8–23)
CO2: 24 mmol/L (ref 22–32)
Calcium: 9.3 mg/dL (ref 8.9–10.3)
Chloride: 108 mmol/L (ref 98–111)
Creatinine, Ser: 1.35 mg/dL — ABNORMAL HIGH (ref 0.44–1.00)
GFR, Estimated: 40 mL/min — ABNORMAL LOW
Glucose, Bld: 76 mg/dL (ref 70–99)
Potassium: 4.2 mmol/L (ref 3.5–5.1)
Sodium: 142 mmol/L (ref 135–145)

## 2024-10-14 MED ORDER — FUROSEMIDE 20 MG PO TABS: 20.0000 mg | ORAL_TABLET | Freq: Every day | ORAL | 11 refills | Status: AC | PRN

## 2024-10-14 MED ORDER — POTASSIUM CHLORIDE CRYS ER 20 MEQ PO TBCR: 20.0000 meq | EXTENDED_RELEASE_TABLET | Freq: Every day | ORAL | 11 refills | Status: AC | PRN

## 2024-10-14 NOTE — Patient Instructions (Signed)
 Medication Changes:  None, continue medications  Lab Work:  Labs done today, your results will be available in MyChart, we will contact you for abnormal readings.  Testing/Procedures:  Your physician has requested that you have an echocardiogram. Echocardiography is a painless test that uses sound waves to create images of your heart. It provides your doctor with information about the size and shape of your heart and how well your hearts chambers and valves are working. This procedure takes approximately one hour. There are no restrictions for this procedure. Please do NOT wear cologne, perfume, aftershave, or lotions (deodorant is allowed). Please arrive 15 minutes prior to your appointment time. IN 6 MONTHS WITH FOLLOW-UP APPOINTMENT  Please note: We ask at that you not bring children with you during ultrasound (echo/ vascular) testing. Due to room size and safety concerns, children are not allowed in the ultrasound rooms during exams. Our front office staff cannot provide observation of children in our lobby area while testing is being conducted. An adult accompanying a patient to their appointment will only be allowed in the ultrasound room at the discretion of the ultrasound technician under special circumstances. We apologize for any inconvenience.  Special Instructions // Education:  Do the following things EVERYDAY: Weigh yourself in the morning before breakfast. Write it down and keep it in a log. Take your medicines as prescribed Eat low salt foods--Limit salt (sodium) to 2000 mg per day.  Stay as active as you can everyday Limit all fluids for the day to less than 2 liters   Follow-Up in: 6 months with an echocardiogram (July 2026), **PLEASE CALL OUR OFFICE IN MAY TO SCHEDULE THIS APPOINTMENT   At the [facility], you and your health needs are our priority. We have a designated team specialized in the treatment of Heart Failure. This Care Team includes your  primary Heart Failure Specialized Cardiologist (physician), Advanced Practice Providers (APPs- Physician Assistants and Nurse Practitioners), and Pharmacist who all work together to provide you with the care you need, when you need it.   You may see any of the following providers on your designated Care Team at your next follow up:  Dr. Toribio Fuel Dr. Ezra Shuck Dr. Odis Brownie Greig Mosses, NP Caffie Shed, [place] [facility] [place], [place] Beckey Coe, NP Jordan Lee, NP [name], PharmD   Please be sure to bring in all your medications bottles to every appointment.   Need to Contact Us :  If you have any questions or concerns before your next appointment please send us  a message through [facility] or call our office at [phone].    TO LEAVE A MESSAGE FOR THE NURSE SELECT OPTION 2, PLEASE LEAVE A MESSAGE INCLUDING: YOUR NAME DATE OF BIRTH CALL BACK NUMBER REASON FOR CALL**this is important as we prioritize the call backs  YOU WILL RECEIVE A CALL BACK THE SAME DAY AS LONG AS YOU CALL BEFORE 4:00 PM

## 2025-01-13 ENCOUNTER — Ambulatory Visit: Admitting: Family Medicine

## 2025-02-27 ENCOUNTER — Ambulatory Visit

## 2025-03-06 ENCOUNTER — Ambulatory Visit

## 2025-08-28 ENCOUNTER — Ambulatory Visit: Admitting: Neurology
